# Patient Record
Sex: Male | Born: 1955
Health system: Southern US, Community
[De-identification: ages and names within clinical notes are randomized; demographics above are authoritative.]

## PROBLEM LIST (undated history)

## (undated) DIAGNOSIS — J45909 Unspecified asthma, uncomplicated: Secondary | ICD-10-CM

## (undated) DIAGNOSIS — N179 Acute kidney failure, unspecified: Secondary | ICD-10-CM

## (undated) DIAGNOSIS — G4730 Sleep apnea, unspecified: Secondary | ICD-10-CM

## (undated) DIAGNOSIS — G4733 Obstructive sleep apnea (adult) (pediatric): Secondary | ICD-10-CM

## (undated) DIAGNOSIS — R51 Headache: Secondary | ICD-10-CM

## (undated) DIAGNOSIS — I429 Cardiomyopathy, unspecified: Secondary | ICD-10-CM

## (undated) DIAGNOSIS — R05 Cough: Secondary | ICD-10-CM

## (undated) DIAGNOSIS — R0600 Dyspnea, unspecified: Secondary | ICD-10-CM

## (undated) DIAGNOSIS — R3916 Straining to void: Secondary | ICD-10-CM

## (undated) DIAGNOSIS — M542 Cervicalgia: Secondary | ICD-10-CM

## (undated) DIAGNOSIS — I509 Heart failure, unspecified: Secondary | ICD-10-CM

## (undated) DIAGNOSIS — F329 Major depressive disorder, single episode, unspecified: Secondary | ICD-10-CM

## (undated) DIAGNOSIS — Z978 Presence of other specified devices: Secondary | ICD-10-CM

## (undated) DIAGNOSIS — R21 Rash and other nonspecific skin eruption: Secondary | ICD-10-CM

## (undated) DIAGNOSIS — I4891 Unspecified atrial fibrillation: Secondary | ICD-10-CM

## (undated) DIAGNOSIS — F419 Anxiety disorder, unspecified: Secondary | ICD-10-CM

## (undated) DIAGNOSIS — Z960 Presence of urogenital implants: Secondary | ICD-10-CM

## (undated) DIAGNOSIS — F32A Depression, unspecified: Secondary | ICD-10-CM

## (undated) DIAGNOSIS — E785 Hyperlipidemia, unspecified: Secondary | ICD-10-CM

## (undated) DIAGNOSIS — K047 Periapical abscess without sinus: Secondary | ICD-10-CM

## (undated) DIAGNOSIS — C801 Malignant (primary) neoplasm, unspecified: Secondary | ICD-10-CM

## (undated) DIAGNOSIS — R200 Anesthesia of skin: Secondary | ICD-10-CM

## (undated) DIAGNOSIS — N400 Enlarged prostate without lower urinary tract symptoms: Secondary | ICD-10-CM

## (undated) DIAGNOSIS — D649 Anemia, unspecified: Secondary | ICD-10-CM

## (undated) DIAGNOSIS — N200 Calculus of kidney: Secondary | ICD-10-CM

## (undated) DIAGNOSIS — I119 Hypertensive heart disease without heart failure: Secondary | ICD-10-CM

## (undated) DIAGNOSIS — I10 Essential (primary) hypertension: Secondary | ICD-10-CM

## (undated) DIAGNOSIS — K0889 Other specified disorders of teeth and supporting structures: Secondary | ICD-10-CM

## (undated) HISTORY — DX: Cervicalgia: M54.2

## (undated) HISTORY — DX: Hypertensive heart disease without heart failure: I11.9

## (undated) HISTORY — DX: Sleep apnea, unspecified: G47.30

## (undated) HISTORY — DX: Acute kidney failure, unspecified: N17.9

## (undated) HISTORY — DX: Straining to void: R39.16

## (undated) HISTORY — PX: CYSTOSCOPY W/ RETROGRADES: SHX1426

## (undated) HISTORY — DX: Rash and other nonspecific skin eruption: R21

## (undated) HISTORY — PX: COLONOSCOPY: SHX174

## (undated) HISTORY — DX: Calculus of kidney: N20.0

## (undated) HISTORY — DX: Unspecified asthma, uncomplicated: J45.909

## (undated) HISTORY — DX: Other specified disorders of teeth and supporting structures: K08.89

## (undated) HISTORY — DX: Malignant (primary) neoplasm, unspecified: C80.1

## (undated) HISTORY — DX: Headache: R51

## (undated) HISTORY — DX: Essential (primary) hypertension: I10

## (undated) HISTORY — DX: Anxiety disorder, unspecified: F41.9

## (undated) HISTORY — DX: Periapical abscess without sinus: K04.7

## (undated) HISTORY — DX: Cough: R05

## (undated) HISTORY — DX: Hyperlipidemia, unspecified: E78.5

## (undated) HISTORY — DX: Benign prostatic hyperplasia without lower urinary tract symptoms: N40.0

## (undated) HISTORY — DX: Unspecified atrial fibrillation: I48.91

## (undated) HISTORY — PX: OTHER SURGICAL HISTORY: SHX169

## (undated) HISTORY — DX: Anemia, unspecified: D64.9

## (undated) HISTORY — DX: Obstructive sleep apnea (adult) (pediatric): G47.33

## (undated) HISTORY — DX: Anesthesia of skin: R20.0

---

## 2003-10-14 ENCOUNTER — Emergency Department (HOSPITAL_COMMUNITY): Admission: EM | Admit: 2003-10-14 | Discharge: 2003-10-14 | Payer: Self-pay | Admitting: Emergency Medicine

## 2004-08-07 ENCOUNTER — Emergency Department (HOSPITAL_COMMUNITY): Admission: EM | Admit: 2004-08-07 | Discharge: 2004-08-07 | Payer: Self-pay | Admitting: Family Medicine

## 2004-09-16 ENCOUNTER — Ambulatory Visit: Payer: Self-pay | Admitting: Internal Medicine

## 2004-09-30 ENCOUNTER — Ambulatory Visit: Payer: Self-pay | Admitting: Internal Medicine

## 2004-10-26 ENCOUNTER — Ambulatory Visit: Payer: Self-pay | Admitting: Internal Medicine

## 2004-11-04 ENCOUNTER — Ambulatory Visit: Payer: Self-pay | Admitting: Internal Medicine

## 2004-11-04 ENCOUNTER — Ambulatory Visit (HOSPITAL_COMMUNITY): Admission: RE | Admit: 2004-11-04 | Discharge: 2004-11-04 | Payer: Self-pay | Admitting: Internal Medicine

## 2005-05-12 ENCOUNTER — Ambulatory Visit: Payer: Self-pay | Admitting: Internal Medicine

## 2005-06-29 ENCOUNTER — Emergency Department (HOSPITAL_COMMUNITY): Admission: EM | Admit: 2005-06-29 | Discharge: 2005-06-29 | Payer: Self-pay | Admitting: Emergency Medicine

## 2005-07-14 ENCOUNTER — Ambulatory Visit: Payer: Self-pay | Admitting: Internal Medicine

## 2005-07-21 ENCOUNTER — Ambulatory Visit: Payer: Self-pay | Admitting: Internal Medicine

## 2005-12-03 ENCOUNTER — Emergency Department (HOSPITAL_COMMUNITY): Admission: EM | Admit: 2005-12-03 | Discharge: 2005-12-03 | Payer: Self-pay | Admitting: Emergency Medicine

## 2006-02-17 ENCOUNTER — Ambulatory Visit: Payer: Self-pay | Admitting: Internal Medicine

## 2006-03-01 DIAGNOSIS — M542 Cervicalgia: Secondary | ICD-10-CM

## 2006-03-01 DIAGNOSIS — I119 Hypertensive heart disease without heart failure: Secondary | ICD-10-CM

## 2006-03-01 DIAGNOSIS — L259 Unspecified contact dermatitis, unspecified cause: Secondary | ICD-10-CM | POA: Insufficient documentation

## 2006-03-01 DIAGNOSIS — I5042 Chronic combined systolic (congestive) and diastolic (congestive) heart failure: Secondary | ICD-10-CM

## 2006-03-01 HISTORY — DX: Hypertensive heart disease without heart failure: I11.9

## 2006-12-07 ENCOUNTER — Telehealth (INDEPENDENT_AMBULATORY_CARE_PROVIDER_SITE_OTHER): Payer: Self-pay | Admitting: Pharmacy Technician

## 2006-12-07 DIAGNOSIS — E1169 Type 2 diabetes mellitus with other specified complication: Secondary | ICD-10-CM | POA: Insufficient documentation

## 2006-12-07 DIAGNOSIS — E785 Hyperlipidemia, unspecified: Secondary | ICD-10-CM

## 2006-12-07 DIAGNOSIS — I10 Essential (primary) hypertension: Secondary | ICD-10-CM

## 2007-02-21 ENCOUNTER — Emergency Department (HOSPITAL_COMMUNITY): Admission: EM | Admit: 2007-02-21 | Discharge: 2007-02-21 | Payer: Self-pay | Admitting: Emergency Medicine

## 2007-06-02 ENCOUNTER — Ambulatory Visit: Payer: Self-pay | Admitting: Internal Medicine

## 2007-06-02 DIAGNOSIS — H538 Other visual disturbances: Secondary | ICD-10-CM

## 2007-06-05 ENCOUNTER — Ambulatory Visit: Payer: Self-pay | Admitting: Internal Medicine

## 2007-06-05 LAB — CONVERTED CEMR LAB
Alkaline Phosphatase: 71 units/L (ref 39–117)
Basophils Absolute: 0 10*3/uL (ref 0.0–0.1)
Eosinophils Absolute: 0.2 10*3/uL (ref 0.0–0.7)
Eosinophils Relative: 3 % (ref 0–5)
Glucose, Bld: 114 mg/dL — ABNORMAL HIGH (ref 70–99)
HCT: 47.7 % (ref 39.0–52.0)
LDL Cholesterol: 138 mg/dL — ABNORMAL HIGH (ref 0–99)
MCHC: 31.9 g/dL (ref 30.0–36.0)
MCV: 84.6 fL (ref 78.0–100.0)
Platelets: 177 10*3/uL (ref 150–400)
RDW: 13.4 % (ref 11.5–15.5)
Sodium: 141 meq/L (ref 135–145)
Total Bilirubin: 0.4 mg/dL (ref 0.3–1.2)
Total Protein: 7 g/dL (ref 6.0–8.3)
Triglycerides: 160 mg/dL — ABNORMAL HIGH (ref <150)
VLDL: 32 mg/dL (ref 0–40)

## 2007-06-23 ENCOUNTER — Encounter: Payer: Self-pay | Admitting: Internal Medicine

## 2007-07-03 ENCOUNTER — Encounter: Payer: Self-pay | Admitting: Internal Medicine

## 2008-01-31 ENCOUNTER — Ambulatory Visit: Payer: Self-pay | Admitting: Internal Medicine

## 2008-01-31 DIAGNOSIS — J309 Allergic rhinitis, unspecified: Secondary | ICD-10-CM | POA: Insufficient documentation

## 2008-01-31 DIAGNOSIS — R519 Headache, unspecified: Secondary | ICD-10-CM | POA: Insufficient documentation

## 2008-01-31 DIAGNOSIS — K3189 Other diseases of stomach and duodenum: Secondary | ICD-10-CM | POA: Insufficient documentation

## 2008-01-31 DIAGNOSIS — R51 Headache: Secondary | ICD-10-CM | POA: Insufficient documentation

## 2008-01-31 DIAGNOSIS — R1013 Epigastric pain: Secondary | ICD-10-CM

## 2008-01-31 LAB — CONVERTED CEMR LAB
AST: 26 units/L (ref 0–37)
BUN: 15 mg/dL (ref 6–23)
Basophils Relative: 0 % (ref 0–1)
CO2: 26 meq/L (ref 19–32)
Calcium: 9.4 mg/dL (ref 8.4–10.5)
Chlamydia, Swab/Urine, PCR: NEGATIVE
Chloride: 103 meq/L (ref 96–112)
Creatinine, Ser: 1.18 mg/dL (ref 0.40–1.50)
Eosinophils Relative: 3 % (ref 0–5)
HCT: 46.7 % (ref 39.0–52.0)
Hemoglobin, Urine: NEGATIVE
Hemoglobin: 15.4 g/dL (ref 13.0–17.0)
Leukocytes, UA: NEGATIVE
MCHC: 33 g/dL (ref 30.0–36.0)
MCV: 84.6 fL (ref 78.0–100.0)
Monocytes Absolute: 0.5 10*3/uL (ref 0.1–1.0)
Monocytes Relative: 9 % (ref 3–12)
Neutro Abs: 2.3 10*3/uL (ref 1.7–7.7)
Nitrite: NEGATIVE
Protein, ur: NEGATIVE mg/dL
RBC: 5.52 M/uL (ref 4.22–5.81)
RDW: 13.7 % (ref 11.5–15.5)
Total Bilirubin: 0.5 mg/dL (ref 0.3–1.2)

## 2008-03-06 ENCOUNTER — Encounter: Payer: Self-pay | Admitting: Internal Medicine

## 2008-03-06 ENCOUNTER — Ambulatory Visit (HOSPITAL_BASED_OUTPATIENT_CLINIC_OR_DEPARTMENT_OTHER): Admission: RE | Admit: 2008-03-06 | Discharge: 2008-03-06 | Payer: Self-pay | Admitting: Internal Medicine

## 2008-03-06 DIAGNOSIS — G4733 Obstructive sleep apnea (adult) (pediatric): Secondary | ICD-10-CM

## 2008-03-06 HISTORY — DX: Obstructive sleep apnea (adult) (pediatric): G47.33

## 2008-03-09 ENCOUNTER — Ambulatory Visit: Payer: Self-pay | Admitting: Internal Medicine

## 2008-03-14 ENCOUNTER — Telehealth: Payer: Self-pay | Admitting: *Deleted

## 2008-03-20 ENCOUNTER — Encounter: Payer: Self-pay | Admitting: Internal Medicine

## 2008-03-27 ENCOUNTER — Encounter: Payer: Self-pay | Admitting: Internal Medicine

## 2008-03-27 DIAGNOSIS — G4733 Obstructive sleep apnea (adult) (pediatric): Secondary | ICD-10-CM | POA: Insufficient documentation

## 2008-04-03 ENCOUNTER — Ambulatory Visit: Payer: Self-pay | Admitting: Internal Medicine

## 2008-04-03 LAB — CONVERTED CEMR LAB: Hgb A1c MFr Bld: 7.4 %

## 2008-04-08 ENCOUNTER — Ambulatory Visit: Payer: Self-pay | Admitting: Internal Medicine

## 2008-04-08 DIAGNOSIS — E1165 Type 2 diabetes mellitus with hyperglycemia: Secondary | ICD-10-CM | POA: Diagnosis present

## 2008-04-08 DIAGNOSIS — E1142 Type 2 diabetes mellitus with diabetic polyneuropathy: Secondary | ICD-10-CM | POA: Diagnosis present

## 2008-04-08 HISTORY — DX: Type 2 diabetes mellitus with hyperglycemia: E11.65

## 2008-04-09 ENCOUNTER — Ambulatory Visit: Payer: Self-pay | Admitting: Internal Medicine

## 2008-04-09 LAB — CONVERTED CEMR LAB

## 2008-04-15 ENCOUNTER — Encounter: Payer: Self-pay | Admitting: Licensed Clinical Social Worker

## 2008-04-19 ENCOUNTER — Ambulatory Visit: Payer: Self-pay | Admitting: Internal Medicine

## 2008-05-06 ENCOUNTER — Ambulatory Visit: Payer: Self-pay | Admitting: Infectious Diseases

## 2008-05-06 LAB — CONVERTED CEMR LAB: Blood Glucose, Fingerstick: 170

## 2008-07-11 ENCOUNTER — Telehealth: Payer: Self-pay | Admitting: *Deleted

## 2008-10-18 ENCOUNTER — Ambulatory Visit: Payer: Self-pay | Admitting: Internal Medicine

## 2008-10-18 ENCOUNTER — Encounter: Payer: Self-pay | Admitting: Internal Medicine

## 2008-10-18 LAB — CONVERTED CEMR LAB
Blood Glucose, Fingerstick: 145
Hgb A1c MFr Bld: 7.4 %

## 2008-10-25 ENCOUNTER — Ambulatory Visit: Payer: Self-pay | Admitting: Internal Medicine

## 2008-12-18 ENCOUNTER — Telehealth: Payer: Self-pay | Admitting: *Deleted

## 2009-02-27 ENCOUNTER — Ambulatory Visit: Payer: Self-pay | Admitting: Internal Medicine

## 2009-02-27 ENCOUNTER — Telehealth: Payer: Self-pay | Admitting: Internal Medicine

## 2009-02-27 LAB — CONVERTED CEMR LAB: Hgb A1c MFr Bld: >14 %

## 2009-02-28 LAB — CONVERTED CEMR LAB
Basophils Relative: 0 % (ref 0–1)
CO2: 22 meq/L (ref 19–32)
Cholesterol: 199 mg/dL (ref 0–200)
Eosinophils Absolute: 0.1 10*3/uL (ref 0.0–0.7)
Eosinophils Relative: 2 % (ref 0–5)
Glucose, Bld: 407 mg/dL — ABNORMAL HIGH (ref 70–99)
HCT: 44.2 % (ref 39.0–52.0)
Hemoglobin: 14.9 g/dL (ref 13.0–17.0)
MCHC: 33.7 g/dL (ref 30.0–36.0)
MCV: 82.9 fL (ref >=78.0)
Monocytes Absolute: 0.4 10*3/uL (ref 0.1–1.0)
Monocytes Relative: 7 % (ref 3–12)
Neutro Abs: 2.7 10*3/uL (ref 1.7–7.7)
RBC: 5.33 M/uL (ref 4.22–5.81)
Sodium: 134 meq/L — ABNORMAL LOW (ref 135–145)
Total Bilirubin: 0.4 mg/dL (ref 0.3–1.2)
Total Protein: 7.1 g/dL (ref 6.0–8.3)
Triglycerides: 379 mg/dL — ABNORMAL HIGH (ref <150)
VLDL: 76 mg/dL — ABNORMAL HIGH (ref 0–40)

## 2009-03-04 ENCOUNTER — Encounter: Payer: Self-pay | Admitting: Internal Medicine

## 2009-03-05 ENCOUNTER — Ambulatory Visit: Payer: Self-pay | Admitting: Internal Medicine

## 2009-03-12 ENCOUNTER — Encounter: Payer: Self-pay | Admitting: Internal Medicine

## 2009-03-19 ENCOUNTER — Telehealth: Payer: Self-pay | Admitting: *Deleted

## 2009-03-26 ENCOUNTER — Telehealth (INDEPENDENT_AMBULATORY_CARE_PROVIDER_SITE_OTHER): Payer: Self-pay | Admitting: *Deleted

## 2009-04-02 ENCOUNTER — Ambulatory Visit: Payer: Self-pay | Admitting: Infectious Disease

## 2009-04-02 ENCOUNTER — Encounter: Payer: Self-pay | Admitting: Internal Medicine

## 2009-04-02 LAB — CONVERTED CEMR LAB: Blood Glucose, Fingerstick: 158

## 2009-04-16 ENCOUNTER — Telehealth (INDEPENDENT_AMBULATORY_CARE_PROVIDER_SITE_OTHER): Payer: Self-pay | Admitting: *Deleted

## 2009-05-05 ENCOUNTER — Telehealth (INDEPENDENT_AMBULATORY_CARE_PROVIDER_SITE_OTHER): Payer: Self-pay | Admitting: *Deleted

## 2009-05-19 ENCOUNTER — Telehealth (INDEPENDENT_AMBULATORY_CARE_PROVIDER_SITE_OTHER): Payer: Self-pay | Admitting: *Deleted

## 2009-07-24 ENCOUNTER — Encounter (INDEPENDENT_AMBULATORY_CARE_PROVIDER_SITE_OTHER): Payer: Self-pay | Admitting: Internal Medicine

## 2009-07-24 ENCOUNTER — Ambulatory Visit: Payer: Self-pay | Admitting: Infectious Diseases

## 2009-07-31 ENCOUNTER — Ambulatory Visit: Payer: Self-pay | Admitting: Internal Medicine

## 2009-07-31 LAB — CONVERTED CEMR LAB
ALT: 29 units/L (ref 0–53)
AST: 19 units/L (ref 0–37)
Albumin: 4 g/dL (ref 3.5–5.2)
Calcium: 8.6 mg/dL (ref 8.4–10.5)
Chloride: 103 meq/L (ref 96–112)
Hgb A1c MFr Bld: 7.5 %
Potassium: 4 meq/L (ref 3.5–5.3)
Total Protein: 7 g/dL (ref 6.0–8.3)

## 2010-01-02 ENCOUNTER — Ambulatory Visit: Payer: Self-pay | Admitting: Internal Medicine

## 2010-01-02 LAB — CONVERTED CEMR LAB
Blood Glucose, AC Bkfst: 490 mg/dL
Hgb A1c MFr Bld: >14 %

## 2010-01-08 ENCOUNTER — Encounter: Payer: Self-pay | Admitting: Internal Medicine

## 2010-01-15 ENCOUNTER — Ambulatory Visit: Payer: Self-pay | Admitting: Internal Medicine

## 2010-01-15 ENCOUNTER — Encounter: Payer: Self-pay | Admitting: Internal Medicine

## 2010-01-15 LAB — CONVERTED CEMR LAB
AST: 24 units/L (ref 0–37)
Alkaline Phosphatase: 60 units/L (ref 39–117)
BUN: 7 mg/dL (ref 6–23)
Calcium: 8.4 mg/dL (ref 8.4–10.5)
Creatinine, Ser: 1.11 mg/dL (ref 0.40–1.50)
Creatinine, Urine: 250.2 mg/dL
HDL: 32 mg/dL — ABNORMAL LOW (ref >39)
LDL Cholesterol: 108 mg/dL — ABNORMAL HIGH (ref 0–99)
Microalb, Ur: 3.71 mg/dL — ABNORMAL HIGH (ref 0.00–1.89)
Total CHOL/HDL Ratio: 5.2
Triglycerides: 132 mg/dL (ref <150)

## 2010-02-04 ENCOUNTER — Telehealth: Payer: Self-pay | Admitting: Internal Medicine

## 2010-03-05 ENCOUNTER — Telehealth: Payer: Self-pay | Admitting: *Deleted

## 2010-03-10 ENCOUNTER — Telehealth: Payer: Self-pay | Admitting: *Deleted

## 2010-03-18 ENCOUNTER — Encounter: Payer: Self-pay | Admitting: Internal Medicine

## 2010-04-01 ENCOUNTER — Encounter: Payer: Self-pay | Admitting: Internal Medicine

## 2010-04-13 ENCOUNTER — Emergency Department (HOSPITAL_COMMUNITY)
Admission: EM | Admit: 2010-04-13 | Discharge: 2010-04-13 | Payer: Self-pay | Source: Home / Self Care | Admitting: Family Medicine

## 2010-04-15 ENCOUNTER — Ambulatory Visit: Payer: Self-pay | Admitting: Internal Medicine

## 2010-04-15 LAB — CONVERTED CEMR LAB
Albumin: 4.2 g/dL (ref 3.5–5.2)
Alkaline Phosphatase: 58 units/L (ref 39–117)
BUN: 14 mg/dL (ref 6–23)
CO2: 29 meq/L (ref 19–32)
Glucose, Bld: 125 mg/dL — ABNORMAL HIGH (ref 70–99)
Hgb A1c MFr Bld: 6.7 %
PSA: 3.13 ng/mL (ref <=4.00)
Potassium: 4.6 meq/L (ref 3.5–5.3)

## 2010-04-15 LAB — HM DIABETES EYE EXAM

## 2010-04-15 LAB — HM DIABETES FOOT EXAM

## 2010-04-16 ENCOUNTER — Encounter (INDEPENDENT_AMBULATORY_CARE_PROVIDER_SITE_OTHER): Payer: Self-pay | Admitting: *Deleted

## 2010-04-17 ENCOUNTER — Ambulatory Visit: Payer: Self-pay | Admitting: Internal Medicine

## 2010-04-17 ENCOUNTER — Encounter (INDEPENDENT_AMBULATORY_CARE_PROVIDER_SITE_OTHER): Payer: Self-pay | Admitting: *Deleted

## 2010-04-17 ENCOUNTER — Telehealth: Payer: Self-pay | Admitting: *Deleted

## 2010-04-29 ENCOUNTER — Telehealth: Payer: Self-pay | Admitting: Internal Medicine

## 2010-05-01 ENCOUNTER — Ambulatory Visit: Payer: Self-pay | Admitting: Internal Medicine

## 2010-05-01 LAB — HM COLONOSCOPY

## 2010-05-29 ENCOUNTER — Emergency Department (HOSPITAL_COMMUNITY)
Admission: EM | Admit: 2010-05-29 | Discharge: 2010-05-29 | Payer: Self-pay | Source: Home / Self Care | Admitting: Emergency Medicine

## 2010-05-29 DIAGNOSIS — N200 Calculus of kidney: Secondary | ICD-10-CM | POA: Insufficient documentation

## 2010-05-29 HISTORY — DX: Calculus of kidney: N20.0

## 2010-06-01 LAB — CBC
HCT: 44.5 % (ref 39.0–52.0)
Hemoglobin: 15 g/dL (ref 13.0–17.0)
MCH: 27.5 pg (ref 26.0–34.0)
MCHC: 33.7 g/dL (ref 30.0–36.0)
MCV: 81.7 fL (ref 78.0–100.0)
Platelets: 163 10*3/uL (ref 150–400)
RBC: 5.45 MIL/uL (ref 4.22–5.81)
RDW: 13.7 % (ref 11.5–15.5)
WBC: 8.8 10*3/uL (ref 4.0–10.5)

## 2010-06-01 LAB — DIFFERENTIAL
Basophils Absolute: 0 10*3/uL (ref 0.0–0.1)
Basophils Relative: 0 % (ref 0–1)
Eosinophils Absolute: 0 10*3/uL (ref 0.0–0.7)
Eosinophils Relative: 0 % (ref 0–5)
Lymphocytes Relative: 13 % (ref 12–46)
Lymphs Abs: 1.2 10*3/uL (ref 0.7–4.0)
Monocytes Absolute: 0.5 10*3/uL (ref 0.1–1.0)
Monocytes Relative: 6 % (ref 3–12)
Neutro Abs: 7.1 10*3/uL (ref 1.7–7.7)
Neutrophils Relative %: 81 % — ABNORMAL HIGH (ref 43–77)

## 2010-06-02 ENCOUNTER — Telehealth: Payer: Self-pay | Admitting: *Deleted

## 2010-06-02 ENCOUNTER — Ambulatory Visit
Admission: RE | Admit: 2010-06-02 | Discharge: 2010-06-02 | Payer: Self-pay | Source: Home / Self Care | Attending: Internal Medicine | Admitting: Internal Medicine

## 2010-06-05 ENCOUNTER — Inpatient Hospital Stay (HOSPITAL_COMMUNITY)
Admission: EM | Admit: 2010-06-05 | Discharge: 2010-06-09 | Payer: Self-pay | Source: Home / Self Care | Attending: Internal Medicine | Admitting: Internal Medicine

## 2010-06-07 ENCOUNTER — Ambulatory Visit (HOSPITAL_COMMUNITY)
Admission: AD | Admit: 2010-06-07 | Discharge: 2010-06-07 | Payer: Self-pay | Source: Home / Self Care | Attending: Urology | Admitting: Urology

## 2010-06-08 ENCOUNTER — Encounter: Payer: Self-pay | Admitting: Internal Medicine

## 2010-06-08 LAB — CBC
HCT: 41.4 % (ref 39.0–52.0)
Hemoglobin: 13.2 g/dL (ref 13.0–17.0)
MCH: 25.8 pg — ABNORMAL LOW (ref 26.0–34.0)
MCHC: 31.9 g/dL (ref 30.0–36.0)
MCV: 80.9 fL (ref 78.0–100.0)
Platelets: 190 10*3/uL (ref 150–400)
RBC: 5.12 MIL/uL (ref 4.22–5.81)
RDW: 13 % (ref 11.5–15.5)
WBC: 8.4 10*3/uL (ref 4.0–10.5)

## 2010-06-08 LAB — BASIC METABOLIC PANEL
BUN: 12 mg/dL (ref 6–23)
CO2: 25 mEq/L (ref 19–32)
Calcium: 9.1 mg/dL (ref 8.4–10.5)
Chloride: 103 mEq/L (ref 96–112)
Creatinine, Ser: 1.68 mg/dL — ABNORMAL HIGH (ref 0.4–1.5)
GFR calc Af Amer: 52 mL/min — ABNORMAL LOW (ref >60)
GFR calc non Af Amer: 43 mL/min — ABNORMAL LOW (ref >60)
Glucose, Bld: 128 mg/dL — ABNORMAL HIGH (ref 70–99)
Potassium: 4.6 mEq/L (ref 3.5–5.1)
Sodium: 136 mEq/L (ref 135–145)

## 2010-06-08 LAB — DIFFERENTIAL
Basophils Absolute: 0 10*3/uL (ref 0.0–0.1)
Basophils Relative: 0 % (ref 0–1)
Eosinophils Absolute: 0 10*3/uL (ref 0.0–0.7)
Eosinophils Relative: 1 % (ref 0–5)
Lymphocytes Relative: 21 % (ref 12–46)
Lymphs Abs: 1.7 10*3/uL (ref 0.7–4.0)
Monocytes Absolute: 0.9 10*3/uL (ref 0.1–1.0)
Monocytes Relative: 11 % (ref 3–12)
Neutro Abs: 5.7 10*3/uL (ref 1.7–7.7)
Neutrophils Relative %: 68 % (ref 43–77)

## 2010-06-08 LAB — RAPID URINE DRUG SCREEN, HOSP PERFORMED
Amphetamines: NOT DETECTED
Barbiturates: NOT DETECTED
Benzodiazepines: NOT DETECTED
Cocaine: NOT DETECTED
Opiates: POSITIVE — AB
Tetrahydrocannabinol: NOT DETECTED

## 2010-06-08 LAB — HIV ANTIBODY (ROUTINE TESTING W REFLEX): HIV: NONREACTIVE

## 2010-06-08 LAB — LIPID PANEL
Cholesterol: 130 mg/dL (ref 0–200)
HDL: 29 mg/dL — ABNORMAL LOW (ref >39)
LDL Cholesterol: 86 mg/dL (ref 0–99)
Total CHOL/HDL Ratio: 4.5 RATIO
Triglycerides: 75 mg/dL (ref <150)
VLDL: 15 mg/dL (ref 0–40)

## 2010-06-08 LAB — URINALYSIS, ROUTINE W REFLEX MICROSCOPIC
Bilirubin Urine: NEGATIVE
Hgb urine dipstick: NEGATIVE
Ketones, ur: NEGATIVE mg/dL
Nitrite: NEGATIVE
Protein, ur: NEGATIVE mg/dL
Specific Gravity, Urine: 1.023 (ref 1.005–1.030)
Urine Glucose, Fasting: NEGATIVE mg/dL
Urobilinogen, UA: 1 mg/dL (ref 0.0–1.0)
pH: 7.5 (ref 5.0–8.0)

## 2010-06-08 LAB — HEMOGLOBIN A1C
Hgb A1c MFr Bld: 7 % — ABNORMAL HIGH (ref <5.7)
Mean Plasma Glucose: 154 mg/dL — ABNORMAL HIGH (ref <117)

## 2010-06-08 LAB — GLUCOSE, CAPILLARY
Glucose-Capillary: 121 mg/dL — ABNORMAL HIGH (ref 70–99)
Glucose-Capillary: 149 mg/dL — ABNORMAL HIGH (ref 70–99)

## 2010-06-08 NOTE — Op Note (Addendum)
[name], Parks NO.:  [id number]  MEDICAL RECORD NO.:  [id number]          PATIENT TYPE:  OUT  LOCATION:  DAY                          FACILITY:  [facility]  PHYSICIAN:  Valetta Fuller, M.D.  DATE OF BIRTH:  31-May-1955  DATE OF PROCEDURE: DATE OF DISCHARGE:                              OPERATIVE REPORT   PREOPERATIVE DIAGNOSES: 1. Left distal ureteral stone. 2. Massive BPH.  POSTOPERATIVE DIAGNOSES: 1. Left distal ureteral stone. 2. Massive BPH.  PROCEDURE:  Cystoscopy.  Attempts at retrograde pyelogram and ureteroscopy unsuccessful.  SURGEON:  Valetta Fuller, M.D.  ANESTHESIA:  General.  INDICATIONS:  Terry Parks is 55 years of age.  The patient had originally presented on May 29, 2010, with sudden onset of severe left flank pain.  This was his first clinical stone event.  He was diagnosed with a small 1-2 mm stone in his distal left ureter and instructed on outpatient followup.  The patient subsequently came back to [facility], continuing to complain of severe pain.  He was admitted by the medical service.  The patient's renal function had declined somewhat with some mild renal insufficiency with a creatinine of approximately 1.7.  An ultrasound was done, which confirmed continued presence of left hydronephrosis and the patient continued to have pain. We were consulted.  We reviewed his CT and found that he did indeed have an obstructing 1-2 mm distal left ureteral stone with massive BPH and a markedly enlarged middle lobe of his prostate extending out well beyond the bladder neck and covering the trigone of his bladder.  The patient was initially seen by my nurse practitioner and then by myself.  Mr. Axel was told that a stone this size ought to have 90% chance of spontaneous passage and would be our preference to continue with attempts at spontaneous passage.  We also told him that given his prostate anatomy, there would be a high  likelihood of some hematuria due to potential trauma to the middle lobe of the prostate and potentially great difficulty if not impossibility of accessing the ureteral orifice. Despite that the patient insisted on having a definitive procedure and did not want to go home without having the stone extracted.  We did encourage him to give it at least an additional 24 hours to see if the stone would pass.  The patient has continued to complain of discomfort. He was transferred to the [facility] Emergency Room.  Again, he requested attempted definitive intervention with the parameters as previously established.  TECHNIQUE/FINDINGS:  The patient was brought to the operating room where he had successful induction of general anesthesia with LMA airway.  He received perioperative ciprofloxacin.  He was placed in lithotomy position and prepped and draped in the usual manner.  Appropriate surgical time-out was performed.  Cystoscopy was initiated.  The patient had massive trilobar hyperplasia with an extremely large middle lobe with numerous friable vessels.  It was difficult to get the rigid cystoscope up and over the middle lobe and with that some initial hematuria began.  After approximately 30-40 minutes of time with a variety of lens systems, we were able  to identify the right ureteral orifice tucked underneath way behind the middle lobe of the prostate. Despite multiple attempts and a good 45 minutes to 60 minutes were unable to identify the left ureteral orifice in order to consider retrograde pyelogram, stent placement, or ureteroscopy.  Because of scope trauma to the middle lobe of the prostate, the patient continued to have gross hematuria and we felt it prudent to at least temporarily place a catheter to monitor the degree of hematuria.  A 22-French 3-way Foley catheter was placed.  Urine remained medium cherry red.  A continuous bladder irrigation with saline was instituted.  The  patient was brought to recovery room in stable condition with additional plans, pending reassessment of the stone, and determination whether percutaneous nephrostomy tube with subsequent double-J stent placement is going to be necessary.  This will depend on whether the stone is indeed still present and also how the patient's pain does over the ensuing observation.     Valetta Fuller, M.D.     DSG/MEDQ  D:  06/07/2010  T:  06/08/2010  Job:  [id number]  Electronically Signed by Barron Alvine M.D. on 06/08/2010 09:23:41 AM

## 2010-06-08 NOTE — Consult Note (Signed)
Terry Parks, Terry Parks NO.:  [id number]  MEDICAL RECORD NO.:  [id number]          PATIENT TYPE:  INP  LOCATION:  6739                         FACILITY:  [facility]  PHYSICIAN:  Valetta Fuller, M.D.  DATE OF BIRTH:  [birth year]  DATE OF CONSULTATION:  06/06/2010 DATE OF DISCHARGE:                                CONSULTATION   REASON FOR CONSULTATION:  Left 1-2 mm obstructing left UVJ calculus with left hydroureteronephrosis.  HISTORY OF PRESENT ILLNESS:  This is a 55 year old gentleman who presented to [facility] on May 29, 2010, for an approximate 5-day history of complaints of left flank pain with radiation to his left abdomen and left groin.  His pain began on that day and was constant.  It was associated with nausea and vomiting as well as urinary frequency.  He denies any fever, chills, diarrhea or hematuria.  CT of abdomen and pelvis was performed which showed a 1-2 mm obstructing left UVJ calculus with hydronephrosis and enlarged prostate. He was sent home with pain medication.  He states that pain was uncontrollable, therefore, he was readmitted on June 05, 2010, for pain control.  He states that the pain waxes and wanes at this time as well as yesterday and is not further associated with nausea, vomiting, fever, chills or urinary frequency.  He continues to have diarrhea as well.  His serum creatinine was obtained on June 05, 2010, and stated some to be 1.6.  Today, it is increased to 1.7.  His urinalysis is negative and his white blood cell count is 8.8.  Renal ultrasound was performed on June 06, 2010, which showed left hydronephrosis and enlarged prostate.  Today, he has no new complaints.  He denies complaints of chest pain or shortness of breath as well.  PAST MEDICAL HISTORY: 1. Diabetes. 2. Hypertension. 3. Hyperlipidemia. 4. BPH.  ALLERGIES:  No known drug allergies.  MEDICATIONS: 1. Lantus insulin 2.  Atenolol 3. Accupril. 4. Metformin. 5. Omeprazole. 6. Pravastatin. 7. Ibuprofen. 8. Dilaudid p.r.n.  SOCIAL HISTORY:  He is single.  He works in [employer], hospital for the [employer].  He lives alone.  FAMILY HISTORY:  He has a mother with cancer, diabetes and dementia.  He also has siblings with kidney stones.  REVIEW OF SYSTEMS:  As stated for per HPI.  PHYSICAL EXAMINATION:  VITAL SIGNS:  Temp 98.0, pulse 79, respirations 20, blood pressure 153/97. CONSTITUTIONAL:  He is a well-developed obese black male, in no acute distress with positive overt discomfort grabbing on his left side. HEENT:  Normocephalic, atraumatic.  Oropharynx clear. ABDOMEN:  Obese, soft, nontender, nondistended with no CVA tenderness. GU:  Penis is without lesion or mass, bilaterally descended testes without mass. EXTREMITIES:  Positive 1+ lower extremity edema.  No atrophy. NEURO:  Remote and recent memory is intact. SKIN:  Warm, dry and intact.  LABORATORY DATA:  Sodium 140, potassium 4.6, chloride 104, CO2 26, BUN 15, creatinine 1.75, glucose 111.  WBC is 6.5, hemoglobin 4.9, hematocrit 39.1, platelets 178.  Urinalysis specific gravity 1.023, pH 7.  The rest is dipstick negative.  RADIOLOGY: 1. CT of abdomen and  pelvis shows 1-2 mm left obstructing UVJ calculus     with left hydroureteronephrosis. 2. A 1-2 mm left lower pole nonobstructing renal calculus. 3. Marked enlarged prostate with a prominent median lobe.  RENAL ULTRASOUND: 1. Left hydronephrosis. 2. Enlarged prostate.  IMPRESSION AND PLAN: 1. A 1-2 mm left obstructing ureterovesical junction calculus with     hydronephrosis.  Recommend allowing the patient to attempt passing     his stone on his own if pain is manageable.  Stone is very small     and located at [employer].  A 90-95% will pass this stone spontaneously if     pain is manageable.  Do expect some hydronephrosis and an     increasing creatinine with obstruction secondary to  calculus as     well as possible to some dehydration.  Should be no damage to     kidney function with his short-term obstruction.  If pain is not     managed, he will require transfer to [facility] on June 07, 2010, for surgical removal and then transferred back to [facility] after     his procedure.  An enlarged prostate does may procedure more     difficult to perform and increase his risk of complications during     the procedure.  Dr. Isabel Caprice will see him later today and talk about     this procedure.  Continue to strain his urine today and tomorrow.     If he denies any further pain later this p.m. or tomorrow morning,     will require further imaging to make sure he did not pass the     stone. 2. Enlarged prostate.  Continue with his Flomax.  May take up to 7     days to see full benefit.     Delia Chimes, NP   ______________________________ Valetta Fuller, M.D.   MA/MEDQ  D:  06/06/2010  T:  06/07/2010  Job:  [id number]  Electronically Signed by Delia Chimes NP on 06/08/2010 10:24:36 AM Electronically Signed by Barron Alvine M.D. on 06/08/2010 11:48:07 AM

## 2010-06-09 DIAGNOSIS — N401 Enlarged prostate with lower urinary tract symptoms: Secondary | ICD-10-CM | POA: Insufficient documentation

## 2010-06-09 LAB — LIPID PANEL
Cholesterol: 121 mg/dL (ref 0–200)
HDL: 24 mg/dL — ABNORMAL LOW (ref >39)
LDL Cholesterol: 80 mg/dL (ref 0–99)
Total CHOL/HDL Ratio: 5 RATIO
Triglycerides: 83 mg/dL (ref <150)
VLDL: 17 mg/dL (ref 0–40)

## 2010-06-09 LAB — BASIC METABOLIC PANEL
BUN: 15 mg/dL (ref 6–23)
BUN: 11 mg/dL (ref 6–23)
CO2: 26 mEq/L (ref 19–32)
CO2: 27 mEq/L (ref 19–32)
Calcium: 8.7 mg/dL (ref 8.4–10.5)
Calcium: 8.3 mg/dL — ABNORMAL LOW (ref 8.4–10.5)
Chloride: 104 mEq/L (ref 96–112)
Chloride: 103 mEq/L (ref 96–112)
Creatinine, Ser: 1.75 mg/dL — ABNORMAL HIGH (ref 0.4–1.5)
Creatinine, Ser: 1.49 mg/dL (ref 0.4–1.5)
Creatinine, Ser: 1.38 mg/dL (ref 0.4–1.5)
GFR calc Af Amer: 50 mL/min — ABNORMAL LOW (ref >60)
GFR calc Af Amer: 60 mL/min — ABNORMAL LOW (ref >60)
GFR calc Af Amer: >60 mL/min (ref >60)
GFR calc non Af Amer: 41 mL/min — ABNORMAL LOW (ref >60)
GFR calc non Af Amer: 50 mL/min — ABNORMAL LOW (ref >60)
Glucose, Bld: 111 mg/dL — ABNORMAL HIGH (ref 70–99)
Glucose, Bld: 171 mg/dL — ABNORMAL HIGH (ref 70–99)
Potassium: 4.6 mEq/L (ref 3.5–5.1)
Sodium: 140 mEq/L (ref 135–145)

## 2010-06-09 LAB — GLUCOSE, CAPILLARY
Glucose-Capillary: 115 mg/dL — ABNORMAL HIGH (ref 70–99)
Glucose-Capillary: 145 mg/dL — ABNORMAL HIGH (ref 70–99)
Glucose-Capillary: 128 mg/dL — ABNORMAL HIGH (ref 70–99)
Glucose-Capillary: 191 mg/dL — ABNORMAL HIGH (ref 70–99)
Glucose-Capillary: 102 mg/dL — ABNORMAL HIGH (ref 70–99)
Glucose-Capillary: 108 mg/dL — ABNORMAL HIGH (ref 70–99)
Glucose-Capillary: 85 mg/dL (ref 70–99)
Glucose-Capillary: 162 mg/dL — ABNORMAL HIGH (ref 70–99)

## 2010-06-09 LAB — CBC
HCT: 39.1 % (ref 39.0–52.0)
Hemoglobin: 12.9 g/dL — ABNORMAL LOW (ref 13.0–17.0)
MCH: 26.8 pg (ref 26.0–34.0)
MCHC: 33 g/dL (ref 30.0–36.0)
MCV: 81.3 fL (ref 78.0–100.0)
Platelets: 178 10*3/uL (ref 150–400)
RBC: 4.81 MIL/uL (ref 4.22–5.81)
RDW: 13.2 % (ref 11.5–15.5)
WBC: 6.5 10*3/uL (ref 4.0–10.5)

## 2010-06-09 LAB — SODIUM, URINE, RANDOM: Sodium, Ur: 165 mEq/L

## 2010-06-09 LAB — URIC ACID, RANDOM URINE: Uric Acid, Urine: 57.4 mg/dL

## 2010-06-09 LAB — CREATININE, URINE, RANDOM: Creatinine, Urine: 187.2 mg/dL

## 2010-06-10 ENCOUNTER — Ambulatory Visit: Admit: 2010-06-10 | Payer: Self-pay | Admitting: Internal Medicine

## 2010-06-10 LAB — URINALYSIS, ROUTINE W REFLEX MICROSCOPIC
Protein, ur: 100 mg/dL — AB
Specific Gravity, Urine: 1.009 (ref 1.005–1.030)
Urine Glucose, Fasting: NEGATIVE mg/dL
Urobilinogen, UA: 0.2 mg/dL (ref 0.0–1.0)

## 2010-06-10 LAB — BASIC METABOLIC PANEL
CO2: 26 mEq/L (ref 19–32)
Calcium: 8.7 mg/dL (ref 8.4–10.5)
Creatinine, Ser: 1.17 mg/dL (ref 0.4–1.5)
Glucose, Bld: 141 mg/dL — ABNORMAL HIGH (ref 70–99)

## 2010-06-10 LAB — URINE MICROSCOPIC-ADD ON

## 2010-06-16 NOTE — Assessment & Plan Note (Signed)
Summary: EST-ROUTINE CHECKUP/CH   Vital Signs:  Patient profile:   55 year old male Height:      71 inches Weight:      317.4 pounds BMI:     44.43 Temp:     98.9 degrees F oral Pulse rate:   81 / minute BP sitting:   162 / 107  (right arm)  Vitals Entered By: Filomena Jungling NT II (April 15, 2010 9:32 AM) CC: HEADACHES/ WENT TO URGENT CARE Is Patient Diabetic? Yes Did you bring your meter with you today? Yes Pain Assessment Patient in pain? yes     Location: HEADACHES Intensity: 6 Type: aching Onset of pain  Intermittent Nutritional Status BMI of > 30 = obese  Have you ever been in a relationship where you felt threatened, hurt or afraid?No   Does patient need assistance? Functional Status Self care Ambulation Normal    Primary Care Provider:  Margarito Liner MD  CC:  HEADACHES/ WENT TO URGENT CARE.  History of Present Illness: Patient returns for followup of his diabetes mellitus, hypertension, hyperlipidemia, and other chronic medical problems. He was recently seen on November 28 at urgent care for a complaint of headache, and was treated with a short course of Tylenol No. 3. He reports that his headache is much improved. He denies any associated symptoms with his headache.  He reports that he is compliant with his medications.    Preventive Screening-Counseling & Management  Alcohol-Tobacco     Smoking Status: never  Caffeine-Diet-Exercise     Caffeine use/day: 0     Does Patient Exercise: yes     Type of exercise: walking  Current Medications (verified): 1)  Atenolol 50 Mg Tabs (Atenolol) .... Take 1 Tablet By Mouth Once A Day 2)  Lisinopril 20 Mg Tabs (Lisinopril) .... Take 1 Tablet By Mouth Once A Day 3)  Amlodipine Besylate 10 Mg Tabs (Amlodipine Besylate) .... Take 1 Tablet By Mouth Once A Day 4)  Metformin Hcl 1000 Mg Tabs (Metformin Hcl) .... Take 1 Tablet By Mouth Two Times A Day 5)  Lantus Solostar 100 Unit/ml Soln (Insulin Glargine) .... Take 12  Units of Insulin Subcutaneously At Bed Time 6)  Truetrack Test  Strp (Glucose Blood) .... Use To Test Blood Sugar Three Times A Day Before Meals 7)  Lancets  Misc (Lancets) .... Use To Test Blood Sugar Three Times A Day Before Meals 8)  Omeprazole 20 Mg Cpdr (Omeprazole) .... Take 1 Capsule By Mouth Once A Day 9)  Pravastatin Sodium 40 Mg Tabs (Pravastatin Sodium) .... Take 1 Tablet By Mouth Once A Day  Allergies (verified): No Known Drug Allergies  Past History:  Past Medical History: Hyperlipidemia Hypertension Cardiomegaly Pleuritis Hyperglycemia Dermatitis Neck pain Diabetes Mellitus Type 2 Allergic rhinitis History of groin abscess History of abscessed tooth Obstructive sleep apnea Headache Dyspepsia  Review of Systems General:  Denies fever. ENT:  Denies ear discharge, earache, and sore throat. CV:  Denies chest pain or discomfort and swelling of feet. Resp:  Denies cough and shortness of breath. GI:  Denies abdominal pain, bloody stools, dark tarry stools, nausea, and vomiting. GU:  Denies dysuria. MS:  Denies muscle aches and cramps.  Physical Exam  General:  alert, no distress Lungs:  normal respiratory effort, normal breath sounds, no crackles, and no wheezes.   Heart:  normal rate, regular rhythm, no murmur, no gallop, and no rub.   Abdomen:  soft, non-tender, and normal bowel sounds.   Extremities:  no edema  Diabetes Management Exam:    Foot Exam (with socks and/or shoes not present):       Sensory-Monofilament:          Left foot: normal          Right foot: normal   Impression & Recommendations:  Problem # 1:  DM (ICD-250.00) Patient's diabetes is well controlled on current regimen. Will continue medications as below, and check labs as below.  He reports having an eye exam by Dr. Hyacinth Meeker last week; will request a copy of that report.  His updated medication list for this problem includes:    Lisinopril 20 Mg Tabs (Lisinopril) .[name]... Take 1 and 1/2   tablets by mouth once a day    Metformin Hcl 1000 Mg Tabs (Metformin hcl) .[name]... Take 1 tablet by mouth two times a day    Lantus Solostar 100 Unit/ml Soln (Insulin glargine) .[name]... Take 12 units of insulin subcutaneously at bed time  Labs Reviewed: Creat: 1.11 (01/15/2010)     Last Eye Exam: No diabetic retinopathy.   Exam by Molly Maduro L. Dione Booze, MD  (03/12/2009) Reviewed HgBA1c results: 6.7 (04/15/2010)  >14.0 (01/02/2010)  Orders: T-CMP with Estimated GFR ([address]) T- Capillary Blood Glucose (82948) T-Hgb A1C (in-house) (40981XB)  Problem # 2:  HYPERTENSION (ICD-401.9) Patient's blood pressure is moderately elevated. The plan is to increase lisinopril to a dose of 30 mg daily.  His updated medication list for this problem includes:    Atenolol 50 Mg Tabs (Atenolol) .[name]... Take 1 tablet by mouth once a day    Lisinopril 20 Mg Tabs (Lisinopril) .[name]... Take 1 and 1/2  tablets by mouth once a day    Amlodipine Besylate 10 Mg Tabs (Amlodipine besylate) .[name]... Take 1 tablet by mouth once a day  BP today: 162/107 Prior BP: 150/94 (01/15/2010)  Labs Reviewed: K+: 4.2 (01/15/2010) Creat: : 1.11 (01/15/2010)   Chol: 166 (01/15/2010)   HDL: 32 (01/15/2010)   LDL: 108 (01/15/2010)   TG: 132 (01/15/2010)  Problem # 3:  HYPERLIPIDEMIA (ICD-272.4) Patient's recent LDL is above goal; the plan is to increase pravastatin to a dose of 80 mg daily.  His updated medication list for this problem includes:    Pravastatin Sodium 40 Mg Tabs (Pravastatin sodium) .[name]... Take 2  tablets by mouth once a day  Labs Reviewed: SGOT: 24 (01/15/2010)   SGPT: 37 (01/15/2010)   HDL:32 (01/15/2010), 28 (07/31/2009)  LDL:108 (01/15/2010), 128 (07/31/2009)  Chol:166 (01/15/2010), 201 (07/31/2009)  Trig:132 (01/15/2010), 225 (07/31/2009)  Problem # 4:  HEADACHE (ICD-784.0) Patient's headache is much better and has essentially resolved. He has had no associated signs or symptoms of a serious underlying problem.  I  advised him to let me know if it recurs or if he has any new symptoms.  His updated medication list for this problem includes:    Atenolol 50 Mg Tabs (Atenolol) .[name]... Take 1 tablet by mouth once a day  Problem # 5:  Preventive Health Care (ICD-V70.0) A Pneumovax was given today. I discussed the pros and cons of PSA testing with patient, and he requested a PSA which was ordered.  Complete Medication List: 1)  Atenolol 50 Mg Tabs (Atenolol) .... Take 1 tablet by mouth once a day 2)  Lisinopril 20 Mg Tabs (Lisinopril) .... Take 1 and 1/2  tablets by mouth once a day 3)  Amlodipine Besylate 10 Mg Tabs (Amlodipine besylate) .... Take 1 tablet by mouth once a day 4)  Metformin  Hcl 1000 Mg Tabs (Metformin hcl) .... Take 1 tablet by mouth two times a day 5)  Lantus Solostar 100 Unit/ml Soln (Insulin glargine) .... Take 12 units of insulin subcutaneously at bed time 6)  Truetrack Test Strp (Glucose blood) .... Use to test blood sugar three times a day before meals 7)  Lancets Misc (Lancets) .... Use to test blood sugar three times a day before meals 8)  Omeprazole 20 Mg Cpdr (Omeprazole) .... Take 1 capsule by mouth once a day 9)  Pravastatin Sodium 40 Mg Tabs (Pravastatin sodium) .... Take 2  tablets by mouth once a day  Other Orders: T-PSA Total ([address]) Pneumococcal Vaccine (82956) Admin 1st Vaccine (21308)  Patient Instructions: 1)  Please schedule a follow-up appointment in 2 months. 2)  Increase lisinopril 20 mg to a dose of 1 and 1/2 tablets daily. 3)  Increase pravastatin 40 mg to a dose of 2 tablets daily.  Prescriptions: PRAVASTATIN SODIUM 40 MG TABS (PRAVASTATIN SODIUM) Take 2  tablets by mouth once a day  #60 x 6   Entered and Authorized by:   Margarito Liner MD   Signed by:   Margarito Liner MD on 04/17/2010   Method used:   Faxed to ...       [facility] Department (retail)       [address] [place], [place]  [address]       Ph: [phone]       Fax:  [phone]   RxID:   [id number] AMLODIPINE BESYLATE 10 MG TABS (AMLODIPINE BESYLATE) Take 1 tablet by mouth once a day  #30 x 6   Entered and Authorized by:   Margarito Liner MD   Signed by:   Margarito Liner MD on 04/17/2010   Method used:   Faxed to ...       [facility] Department (retail)       [address] [place], [place]  [address]       Ph: [phone]       Fax: [phone]   RxID:   [id number] LISINOPRIL 20 MG TABS (LISINOPRIL) Take 1 and 1/2  tablets by mouth once a day  #45 x 6   Entered and Authorized by:   Margarito Liner MD   Signed by:   Margarito Liner MD on 04/17/2010   Method used:   Faxed to ...       [facility] Department (retail)       [address] [place], [place]  [address]       Ph: [phone]       Fax: [phone]   RxID:   [id number]    Orders Added: 1)  T-PSA Total [[address]] 2)  T-CMP with Estimated GFR [[address]] 3)  Pneumococcal Vaccine [90732] 4)  Admin 1st Vaccine [90471] 5)  T- Capillary Blood Glucose [82948] 6)  T-Hgb A1C (in-house) [83036QW] 7)  Est. Patient Level IV [16967]   Immunizations Administered:  Pneumonia Vaccine:    Vaccine Type: Pneumovax    Site: right deltoid    Mfr: Merck    Dose: 0.5 ml    Route: IM    Given by: Angelina Ok RN    Exp. Date: 09/11/2011    Lot #: [id number]    VIS given: 04/21/09 version given April 15, 2010.   Immunizations Administered:  Pneumonia Vaccine:    Vaccine Type:  Pneumovax    Site: right deltoid    Mfr: Merck    Dose: 0.5 ml    Route: IM    Given by: Angelina Ok RN    Exp. Date: 09/11/2011    Lot #: [id number]    VIS given: 04/21/09 version given April 15, 2010.  Prevention & Chronic Care Immunizations   Influenza vaccine: Fluvax Non-MCR  (01/15/2010)   Influenza vaccine deferral: Deferred  (01/02/2010)    Tetanus booster: 01/31/2008: Tdap    Pneumococcal vaccine: Pneumovax  (04/15/2010)   Pneumococcal vaccine deferral:  Refused  (07/24/2009)  Colorectal Screening   Hemoccult: negative x 3  (04/08/2008)   Hemoccult action/deferral: Deferred  (01/02/2010)   Hemoccult due: 04/2009    Colonoscopy: Not documented   Colonoscopy action/deferral: GI referral  (01/15/2010)  Other Screening   PSA: 2.64  (01/31/2008)   PSA ordered.   PSA action/deferral: Discussed-PSA requested  (04/15/2010)  Reports requested:  Smoking status: never  (04/15/2010)    Screening comments: GI appointment already scheduled for screening colonoscopy.  Diabetes Mellitus   HgbA1C: 6.7  (04/15/2010)    Eye exam: No diabetic retinopathy.   Exam by Molly Maduro L. Dione Booze, MD   (03/12/2009)   Last eye exam report requested.   Diabetic eye exam action/deferral: Ophthalmology referral  (01/15/2010)   Eye exam due: 03/2010    Foot exam: yes  (04/15/2010)   Foot exam action/deferral: Do today   High risk foot: No  (04/15/2010)   Foot care education: Done  (04/15/2010)   Foot exam due: 04/16/2011    Urine microalbumin/creatinine ratio: 14.8  (01/15/2010)   Urine microalbumin action/deferral: Ordered    Diabetes flowsheet reviewed?: Yes   Progress toward A1C goal: At goal   Diabetes comments: Saw Dr. Hyacinth Meeker last week.  Lipids   Total Cholesterol: 166  (01/15/2010)   LDL: 108  (01/15/2010)   LDL Direct: Not documented   HDL: 32  (01/15/2010)   Triglycerides: 132  (01/15/2010)    SGOT (AST): 24  (01/15/2010)   SGPT (ALT): 37  (01/15/2010)   Alkaline phosphatase: 60  (01/15/2010)   Total bilirubin: 0.4  (01/15/2010)    Lipid flowsheet reviewed?: Yes   Progress toward LDL goal: Improved  Hypertension   Last Blood Pressure: 162 / 107  (04/15/2010)   Serum creatinine: 1.11  (01/15/2010)   Serum potassium 4.2  (01/15/2010)    Hypertension flowsheet reviewed?: Yes   Progress toward BP goal: Deteriorated  Self-Management Support :   Personal Goals (by the next clinic visit) :     Personal A1C goal: 7  (07/24/2009)      Personal blood pressure goal: 130/80  (07/24/2009)     Personal LDL goal: 100  (07/24/2009)    Patient will work on the following items until the next clinic visit to reach self-care goals:     Medications and monitoring: take my medicines every day, check my blood sugar, examine my feet every day  (04/15/2010)     Eating: drink diet soda or water instead of juice or soda, eat more vegetables, use fresh or frozen vegetables, eat foods that are low in salt, limit or avoid alcohol  (04/15/2010)     Activity: take a 30 minute walk every day  (01/15/2010)    Diabetes self-management support: Education handout, Written self-care plan  (04/15/2010)   Diabetes care plan printed   Diabetes education handout printed   Last diabetes self-management training by diabetes educator: 04/02/2009   Last medical nutrition  therapy: 05/06/2008    Hypertension self-management support: Education handout, Written self-care plan  (04/15/2010)   Hypertension self-care plan printed.   Hypertension education handout printed    Lipid self-management support: Education handout, Written self-care plan  (04/15/2010)   Lipid self-care plan printed.   Lipid education handout printed   Nursing Instructions: Diabetic foot exam today Request report of last diabetic eye exam Give Pneumovax today    Diabetic Foot Exam Foot Inspection Is there a history of a foot ulcer?              No Is there a foot ulcer now?              No Can the patient see the bottom of their feet?          Yes Are the shoes appropriate in style and fit?          Yes Is there swelling or an abnormal foot shape?          No Are the toenails long?                No Are the toenails thick?                No Are the toenails ingrown?              No Is there heavy callous build-up?              No Is there pain in the calf muscle (Intermittent claudication) when walking?    NoIs there a claw toe deformity?              No Is there elevated  skin temperature?            No Is there limited ankle dorsiflexion?            No Is there foot or ankle muscle weakness?            No  Diabetic Foot Care Education Patient educated on appropriate care of diabetic feet.  Pulse Check          Right Foot          Left Foot Posterior Tibial:        normal            normal Dorsalis Pedis:        normal            normal  High Risk Feet? No Set Next Diabetic Foot Exam here: 04/16/2011   10-g (5.07) Semmes-Weinstein Monofilament Test Performed by: Filomena Jungling NT II          Right Foot          Left Foot Visual Inspection               Test Control      normal         normal Site 1         normal         normal Site 2         normal         normal Site 3         normal         normal Site 4         normal         normal Site 5         normal  normal Site 6         normal         normal Site 7         normal         normal Site 8         normal         normal Site 9         normal         normal Site 10         normal         normal  Impression      normal         normal   Laboratory Results   Blood Tests   Date/Time Received: April 15, 2010 10:05 AM  Date/Time Reported: Burke Keels  April 15, 2010 10:05 AM   HGBA1C: 6.7%   (Normal Range: Non-Diabetic - 3-6%   Control Diabetic - 6-8%) CBG Fasting:: 136mg /dL    Process Orders Check Orders Results:     [facility]: ABN not required for this insurance Tests Sent for requisitioning (April 17, 2010 10:13 AM):     04/15/2010: [facility] -- T-PSA Total [[id number]] (signed)     04/15/2010: [facility] -- T-CMP with Estimated GFR [[id number]] (signed)

## 2010-06-16 NOTE — Progress Notes (Signed)
Summary: refill/gg  Phone Note Refill Request  on February 04, 2010 3:26 PM  Refills Requested: Medication #1:  LISINOPRIL 20 MG TABS Take 1 tablet by mouth once a day   Last Refilled: 01/02/2010  Method Requested: Fax to Local Pharmacy Initial call taken by: Merrie Roof RN,  February 04, 2010 3:28 PM  Follow-up for Phone Call        Refill approved-nurse to complete Follow-up by: Julaine Fusi  DO,  February 05, 2010 8:48 AM    Prescriptions: LISINOPRIL 20 MG TABS (LISINOPRIL) Take 1 tablet by mouth once a day  #30 x 0   Entered by:   Julaine Fusi  DO   Authorized by:   Margarito Liner MD   Signed by:   Julaine Fusi  DO on 02/05/2010   Method used:   Faxed to ...       [facility] Department (retail)       [address] [place], [place]  [address]       Ph: [phone]       Fax: [phone]   RxID:   [id number]

## 2010-06-16 NOTE — Progress Notes (Signed)
Summary: Change Lisinopril to Accupril  Phone Note Refill Request  on April 17, 2010 5:09 PM  Refills Requested: Medication #1:  LISINOPRIL 20 MG TABS Take 1 and 1/2  tablets by mouth once a day  Method Requested: Fax to Local Pharmacy Initial call taken by: Merrie Roof RN,  April 17, 2010 5:09 PM  Follow-up for Phone Call        Rx changed to Accupril as requested.  Rx faxed to pharmacy. Follow-up by: Margarito Liner MD,  April 17, 2010 5:18 PM    New/Updated Medications: ACCUPRIL 20 MG TABS (QUINAPRIL HCL) Take 1 and 1/2 tablets daily.  Prescriptions: ACCUPRIL 20 MG TABS (QUINAPRIL HCL) Take 1 and 1/2 tablets daily.  #45 x 6   Entered and Authorized by:   Margarito Liner MD   Signed by:   Margarito Liner MD on 04/17/2010   Method used:   Faxed to ...       [facility] Department (retail)       [address] [place], [place]  [address]       Ph: [phone]       Fax: [phone]   RxID:   [id number]

## 2010-06-16 NOTE — Assessment & Plan Note (Signed)
Summary: DIABETIC FU PER DONNA R./BOGGALA/DS   Vital Signs:  Patient profile:   55 year old male Height:      71 inches (180.34 cm) Weight:      319.3 pounds (138.32 kg) BMI:     42.59 Temp:     98.1 degrees F (36.72 degrees C) oral Pulse rate:   78 / minute BP sitting:   159 / 93  (right arm) Cuff size:   large  Vitals Entered By: Theotis Barrio NT II (July 24, 2009 8:47 AM) Is Patient Diabetic? Yes Did you bring your meter with you today? Yes Pain Assessment Patient in pain? no      Nutritional Status BMI of > 30 = obese  Have you ever been in a relationship where you felt threatened, hurt or afraid?No   Does patient need assistance? Functional Status Self care Ambulation Normal   Primary Care Provider:  Margarito Liner MD   History of Present Illness: Terry Parks is a 55 year old Male with PMH/problems as outlined in the EMR, who presents to the [facility] for follow up on his DM. Gained some weight, but now he is reading labels and trying to be more careful with it. Re: his numbers: range 120 to 200s. But mostly 130-150. He doesn't have any new complaints today.   Current Medications (verified): 1)  Atenolol 50 Mg Tabs (Atenolol) .... Take 1 Tablet By Mouth Once A Day 2)  Hydrochlorothiazide 25 Mg Tabs (Hydrochlorothiazide) .... Take 1 Tablet By Mouth Once A Day 3)  Amlodipine Besylate 5 Mg Tabs (Amlodipine Besylate) .... Take 1 Tablet By Mouth Once A Day 4)  Xyzal 5 Mg Tabs (Levocetirizine Dihydrochloride) .... Take 1 Tablet By Mouth Once A Day 5)  Cetirizine Hcl 10 Mg Tabs (Cetirizine Hcl) .... Take 1 Tablet By Mouth Once A Day 6)  Metformin Hcl 500 Mg Tabs (Metformin Hcl) .... Take 2 Tablets By Mouth Two Times A Day 7)  Lantus Solostar 100 Unit/ml Soln (Insulin Glargine) .... Take 18 Units of Insulin Subcutaneously At Bed Time, Increase By 2 Units Every 3 Days For Fasting Blood Sugars More Than 130 8)  Truetrack Test  Strp (Glucose Blood) .... Use To Test Blood Sugar  Three Times A Day Before Meals 9)  Lancets  Misc (Lancets) .... Use To Test Blood Sugar Three Times A Day Before Meals 10)  Omeprazole 20 Mg Cpdr (Omeprazole) .... Take 1 Capsule By Mouth Once A Day  Allergies (verified): No Known Drug Allergies  Past History:  Past Medical History: Last updated: 02/27/2009 Hyperlipidemia Hypertension Cardiomegaly Pleuritis Hyperglycemia Dermatitis Neck pain Diabetes Mellitus Type 2  Family History: Last updated: 06/02/2007 Breast cancer in mother. No colon cancer or prostate cancer.  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   Risk Factors: Caffeine Use: 0 (03/05/2009) Exercise: yes (03/05/2009)  Risk Factors: Smoking Status: never (04/02/2009)  Review of Systems       as per HPI  Physical Exam  General:  alert and overweight-appearing.   Lungs:  normal respiratory effort and normal breath sounds.   Heart:  normal rate and regular rhythm.   Abdomen:  soft and non-tender.   Pulses:  normal peripheral pulses  Extremities:  no cyanosis, clubbing or edema  Neurologic:  non focal Psych:  normally interactive.     Impression & Recommendations:  Problem # 1:  DM (ICD-250.00) DATA REVIEWED. A1c: >14.0 (03/05/2009 10:31:45 AM).  MICROALB/CR: 10.5 (02/27/2009 8:06:00 PM) LDL: 98 (02/27/2009  8:06:00 PM) EYE: No diabetic retinopathy.   Exam by Molly Maduro L. Dione Booze, MD  (03/12/2009 6:37:15 PM) FOOT: DONE TODAY, NORMAL EXAM WEIGHT: 42.59 (07/24/2009 8:40:29 AM)  Patient will get A1c and FLP done next week. His CBG are mostly in 120 -150, I will review his insulin dose after the blood work.  His updated medication list for this problem includes:    Metformin Hcl 500 Mg Tabs (Metformin hcl) .[name]... Take 2 tablets by mouth two times a day    Lantus Solostar 100 Unit/ml Soln (Insulin glargine) .[name]... Take 18 units of insulin subcutaneously at bed time, increase by 2 units every 3 days for fasting blood sugars more  than 130  Orders: T- Capillary Blood Glucose ([id number]) T-Hgb A1C (in-house) ([id number])Future Orders: T-Lipid Profile ([id number]) ... 07/28/2009 T-Comprehensive Metabolic Panel [id number]) ... 07/28/2009  Problem # 2:  HYPERTENSION (ICD-401.9) High BP confirmed manually. I will change his amlodipine to 10 mg. B-met todya.  His updated medication list for this problem includes:    Atenolol 50 Mg Tabs (Atenolol) .[name]... Take 1 tablet by mouth once a day    Hydrochlorothiazide 25 Mg Tabs (Hydrochlorothiazide) .[name]... Take 1 tablet by mouth once a day    Amlodipine Besylate 10 Mg Tabs (Amlodipine besylate) .[name]... Take 1 tablet by mouth once a day  Problem # 3:  HYPERLIPIDEMIA (ICD-272.4) Will check FLP, and consider statin.  Future Orders: T-Hgb A1C (in-house) ([id number]) ... 07/28/2009 T-Lipid Profile [id number]) ... 07/28/2009 T-Comprehensive Metabolic Panel [id number]) ... 07/28/2009  Complete Medication List: 1)  Atenolol 50 Mg Tabs (Atenolol) .... Take 1 tablet by mouth once a day 2)  Hydrochlorothiazide 25 Mg Tabs (Hydrochlorothiazide) .... Take 1 tablet by mouth once a day 3)  Amlodipine Besylate 10 Mg Tabs (Amlodipine besylate) .... Take 1 tablet by mouth once a day 4)  Xyzal 5 Mg Tabs (Levocetirizine dihydrochloride) .... Take 1 tablet by mouth once a day 5)  Cetirizine Hcl 10 Mg Tabs (Cetirizine hcl) .... Take 1 tablet by mouth once a day 6)  Metformin Hcl 500 Mg Tabs (Metformin hcl) .... Take 2 tablets by mouth two times a day 7)  Lantus Solostar 100 Unit/ml Soln (Insulin glargine) .... Take 18 units of insulin subcutaneously at bed time, increase by 2 units every 3 days for fasting blood sugars more than 130 8)  Truetrack Test Strp (Glucose blood) .... Use to test blood sugar three times a day before meals 9)  Lancets Misc (Lancets) .... Use to test blood sugar three times a day before meals 10)  Omeprazole 20 Mg Cpdr (Omeprazole) .... Take 1 capsule by mouth once a  day  Patient Instructions: 1)  Please schedule a follow-up appointment in 3 months. 2)  Please come back for a fasting blood test.  3)  We will let you know if anything wrong with your lab work.   Prescriptions: AMLODIPINE BESYLATE 10 MG TABS (AMLODIPINE BESYLATE) Take 1 tablet by mouth once a day  #30 x 5   Entered and Authorized by:   Zara Council MD   Signed by:   Zara Council MD on 07/24/2009   Method used:   Faxed to ...       [facility] (retail)       [address] [place], [place]  [address]       Ph: [phone]       Fax: [phone]   RxID:   [id number]  Process Orders Check  Orders Results:     [facility]: ABN not required for this insurance Tests Sent for requisitioning (July 24, 2009 9:17 AM):     07/28/2009: [facility] -- T-Lipid Profile [phone] (signed)     07/28/2009: [facility] -- T-Comprehensive Metabolic Panel [id number] (signed)    Prevention & Chronic Care Immunizations   Influenza vaccine: Fluvax Non-MCR  (03/05/2009)    Tetanus booster: 01/31/2008: Tdap    Pneumococcal vaccine: Not documented   Pneumococcal vaccine deferral: Refused  (07/24/2009)  Colorectal Screening   Hemoccult: negative x 3  (04/08/2008)   Hemoccult due: 04/2009    Colonoscopy: Not documented  Other Screening   PSA: 2.64  (01/31/2008)   Smoking status: never  (04/02/2009)  Diabetes Mellitus   HgbA1C: >14.0  (03/05/2009)    Eye exam: No diabetic retinopathy.   Exam by Molly Maduro L. Dione Booze, MD   (03/12/2009)   Eye exam due: 03/2010    Foot exam: Not documented   High risk foot: No  (07/24/2009)   Foot care education: Not documented   Foot exam due: 07/30/2010    Urine microalbumin/creatinine ratio: 10.5  (02/27/2009)    Diabetes flowsheet reviewed?: Yes   Progress toward A1C goal: Unchanged  Lipids   Total Cholesterol: 199  (02/27/2009)   LDL: 98  (02/27/2009)   LDL  Direct: Not documented   HDL: 25  (02/27/2009)   Triglycerides: 379  (02/27/2009)    SGOT (AST): 19  (02/27/2009)   SGPT (ALT): 27  (02/27/2009) CMP ordered    Alkaline phosphatase: 95  (02/27/2009)   Total bilirubin: 0.4  (02/27/2009)    Lipid flowsheet reviewed?: Yes   Progress toward LDL goal: Unchanged  Hypertension   Last Blood Pressure: 159 / 93  (07/24/2009)   Serum creatinine: 1.15  (02/27/2009)   Serum potassium 4.0  (02/27/2009) CMP ordered     Hypertension flowsheet reviewed?: Yes   Progress toward BP goal: Deteriorated  Self-Management Support :   Personal Goals (by the next clinic visit) :     Personal A1C goal: 7  (07/24/2009)     Personal blood pressure goal: 130/80  (07/24/2009)     Personal LDL goal: 100  (07/24/2009)    Patient will work on the following items until the next clinic visit to reach self-care goals:     Medications and monitoring: take my medicines every day, check my blood sugar, bring all of my medications to every visit, examine my feet every day  (07/24/2009)     Eating: eat more vegetables, use fresh or frozen vegetables, eat foods that are low in salt, limit or avoid alcohol  (07/24/2009)     Activity: take the stairs instead of the elevator, park at the far end of the parking lot  (07/24/2009)    Diabetes self-management support: Resources for patients handout  (07/24/2009)   Last diabetes self-management training by diabetes educator: 04/02/2009   Last medical nutrition therapy: 05/06/2008    Hypertension self-management support: Resources for patients handout  (07/24/2009)    Lipid self-management support: Resources for patients handout  (07/24/2009)        Resource handout printed.   Last LDL:                                                 98 (02/27/2009 8:06:00 PM)  Diabetic Foot Exam Pulse Check          Right Foot          Left Foot Posterior Tibial:        3+            3+ Dorsalis Pedis:        3+             3+  High Risk Feet? No Set Next Diabetic Foot Exam here: 07/30/2010   10-g (5.07) Semmes-Weinstein Monofilament Test Performed by: Terry Lasser MD          Right Foot          Left Foot Site 1         normal         normal Site 4         normal         normal Site 5         normal         normal Site 6         normal         normal

## 2010-06-16 NOTE — Progress Notes (Signed)
Summary: Refill/gh  Phone Note Refill Request Message from:  Fax from Pharmacy on March 05, 2010 2:02 PM  Refills Requested: Medication #1:  LISINOPRIL 20 MG TABS Take 1 tablet by mouth once a day   Last Refilled: 02/06/2010  Method Requested: Fax to Local Pharmacy Initial call taken by: Angelina Ok RN,  March 05, 2010 2:03 PM  Follow-up for Phone Call        Refill approved-nurse to complete. Follow-up by: Margarito Liner MD,  March 05, 2010 5:01 PM  Additional Follow-up for Phone Call Additional follow up Details #1::        Rx called to pharmacy - [facility] pharmacy. Additional Follow-up by: Chinita Pester RN,  March 09, 2010 11:32 AM    Prescriptions: LISINOPRIL 20 MG TABS (LISINOPRIL) Take 1 tablet by mouth once a day  #30 x 3   Entered and Authorized by:   Margarito Liner MD   Signed by:   Margarito Liner MD on 03/05/2010   Method used:   Telephoned to ...       [facility] (retail)       [address] [place], [place]  [address]       Ph: [phone]       Fax: [phone]   RxID:   [id number]

## 2010-06-16 NOTE — Assessment & Plan Note (Signed)
Summary: FU DM (BOGGALA)/DS   Vital Signs:  Patient profile:   55 year old male Height:      71 inches (180.34 cm) Weight:      307.4 pounds (139.73 kg) BMI:     43.03 Temp:     98.0 degrees F (36.67 degrees C) oral Pulse rate:   60 / minute BP sitting:   150 / 94  (left arm) Cuff size:   large  Vitals Entered By: Theotis Barrio NT II (January 15, 2010 9:30 AM) CC: FOLLOW UP ON HIGH BLOOD SUGAR / FLU SHOT / STATES HE IS FEELING MUCH BETTER Is Patient Diabetic? Yes Did you bring your meter with you today? Yes Pain Assessment Patient in pain? no      Nutritional Status BMI of > 30 = obese  Have you ever been in a relationship where you felt threatened, hurt or afraid?No   Does patient need assistance? Functional Status Self care Ambulation Normal   Primary Care Provider:  Margarito Liner MD  CC:  FOLLOW UP ON HIGH BLOOD SUGAR / FLU SHOT / STATES HE IS FEELING MUCH BETTER.  History of Present Illness: 55 yo m with PMH outlined in the EMR presents for f/u of his DM and HTN.  Pt recently resumed tx for DM, HTN, and HLD after being out of medication for 1-2 months 2/2 "stress and being out of town."  He is now taking 18units of Lantus every night with CBGs ranging in the 200s.  No episodes of hypoglycemia.  He is also taking metformin without adverse effect.  Review of CBG meter reveals CBG measurement are trending down from 400-600 to 200-300 over the past week.  HTN: Pt reports compliance with medications.  No adverse effects from lisinopril.  He notes his BP is high on arrival to clinic today but feels this is b/c he was rushing to arrive on time this am.  HLD: pt is taking pravastatin qd without adverse effect.   Patient is feeling well and denies CP, abdominal pain, nausea, vomiting, HA's, palpitations, blurred vision. fever, chills, diarrhea, constipation or SOB.  He has no other concerns or complaints today.  Preventive Screening-Counseling &  Management  Alcohol-Tobacco     Smoking Status: never  Caffeine-Diet-Exercise     Caffeine use/day: 0     Does Patient Exercise: yes     Type of exercise: walking  Current Medications (verified): 1)  Atenolol 50 Mg Tabs (Atenolol) .... Take 1 Tablet By Mouth Once A Day 2)  Lisinopril 20 Mg Tabs (Lisinopril) .... Take 1 Tablet By Mouth Once A Day 3)  Amlodipine Besylate 10 Mg Tabs (Amlodipine Besylate) .... Take 1 Tablet By Mouth Once A Day 4)  Metformin Hcl 500 Mg Tabs (Metformin Hcl) .... Take 1 Tablet By Mouth Two Times A Day 5)  Lantus Solostar 100 Unit/ml Soln (Insulin Glargine) .... Take 12 Units of Insulin Subcutaneously At Bed Time, Increase By 2 Units Every 3 Days For Fasting Blood Sugars More Than 130 6)  Truetrack Test  Strp (Glucose Blood) .... Use To Test Blood Sugar Three Times A Day Before Meals 7)  Lancets  Misc (Lancets) .... Use To Test Blood Sugar Three Times A Day Before Meals 8)  Omeprazole 20 Mg Cpdr (Omeprazole) .... Take 1 Capsule By Mouth Once A Day 9)  Pravastatin Sodium 40 Mg Tabs (Pravastatin Sodium) .... Take 1 Tablet By Mouth Once A Day  Allergies (verified): No Known Drug Allergies  Past History:  Past medical, surgical, family and social histories (including risk factors) reviewed for relevance to current acute and chronic problems.  Past Medical History: Reviewed history from 02/27/2009 and no changes required. Hyperlipidemia Hypertension Cardiomegaly Pleuritis Hyperglycemia Dermatitis Neck pain Diabetes Mellitus Type 2  Family History: Reviewed history from 06/02/2007 and no changes required. Breast cancer in mother. No colon cancer or prostate cancer.  Social History: Reviewed history from 10/18/2008 and no changes required. 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   Physical Exam  General:  alert and overweight-appearing.   Head:  Normocephalic and atraumatic without obvious abnormalities.  Eyes:  No corneal or conjunctival inflammation  noted. EOMI. PERRLA. Vision grossly normal.  Anicteric Mouth:  Oral mucosa and oropharynx without lesions or exudates. pharynx pink and moist.   Neck:  supple and full ROM.   Lungs:  normal respiratory effort, no intercostal retractions, no accessory muscle use, and normal breath sounds.   Heart:  normal rate and regular rhythm.   Abdomen:  soft and non-tender.  normal bowel sounds, no distention, and no guarding.   Extremities:  no cyanosis, clubbing or edema  Neurologic:  alert & oriented X3, cranial nerves II-XII intact, strength normal in all extremities, and gait normal.   Skin:  turgor normal, color normal, and no rashes.   Psych:  Oriented X3, memory intact for recent and remote, normally interactive, and good eye contact.     Impression & Recommendations:  Problem # 1:  DM (ICD-250.00) Pt is tolerating his current DM regimen well and reports full compliance with meds.  Will obtain microalb/cr ratio today and refer pt for annual eye exam.  Will continue to monitor his progress closely and repeat A1c in 2months.  Advised pt to call the clinic with any concerns or questions re: Insulin.   His updated medication list for this problem includes:    Lisinopril 20 Mg Tabs (Lisinopril) .[name]... Take 1 tablet by mouth once a day    Metformin Hcl 500 Mg Tabs (Metformin hcl) .[name]... Take 1 tablet by mouth two times a day    Lantus Solostar 100 Unit/ml Soln (Insulin glargine) .[name]... Take 12 units of insulin subcutaneously at bed time, increase by 2 units every 3 days for fasting blood sugars more than 130  Orders: T-Urine Microalbumin w/creat. ratio [id number]) Ophthalmology Referral (Ophthalmology)  Labs Reviewed: Creat: 1.27 (07/31/2009)     Last Eye Exam: No diabetic retinopathy.   Exam by Molly Maduro L. Dione Booze, MD  (03/12/2009) Reviewed HgBA1c results: >14.0 (01/02/2010)  7.5 (07/31/2009)  Problem # 2:  HYPERTENSION (ICD-401.9) BP seems fairly stable and above goal.  WIll repeat  measurement and f/u at next visit.  Will continue current anti-HTN regimen for now and consider change, if indicated,at his next appt.  WIll check CMET today to evaluate electolyte and renal function.  His updated medication list for this problem includes:    Atenolol 50 Mg Tabs (Atenolol) .[name]... Take 1 tablet by mouth once a day    Lisinopril 20 Mg Tabs (Lisinopril) .[name]... Take 1 tablet by mouth once a day    Amlodipine Besylate 10 Mg Tabs (Amlodipine besylate) .[name]... Take 1 tablet by mouth once a day  Orders: T-Comprehensive Metabolic Panel [id number]) T-Lipid Profile ([id number])  BP today: 150/94 Prior BP: 148/87 (01/02/2010)  Labs Reviewed: K+: 4.0 (07/31/2009) Creat: : 1.27 (07/31/2009)   Chol: 201 (07/31/2009)   HDL: 28 (07/31/2009)   LDL: 128 (07/31/2009)   TG: 225 (07/31/2009)  Problem # 3:  HYPERLIPIDEMIA (ICD-272.4)  Pt tolerating pravastatin without adverse effects.  Will check lipid panel today as pt is currently fasting, will also check LFTs today.  His updated medication list for this problem includes:    Pravastatin Sodium 40 Mg Tabs (Pravastatin sodium) .[name]... Take 1 tablet by mouth once a day  Orders: T-Comprehensive Metabolic Panel [id number]) T-Lipid Profile [id number])  Labs Reviewed: SGOT: 19 (07/31/2009)   SGPT: 29 (07/31/2009)   HDL:28 (07/31/2009), 25 (02/27/2009)  LDL:128 (07/31/2009), 98 (24/40/1027)  Chol:201 (07/31/2009), 199 (02/27/2009)  Trig:225 (07/31/2009), 379 (02/27/2009)  Problem # 4:  Preventive Health Care (ICD-V70.0)  Reviewed preventive care protocols, scheduled due services, and updated immunizations. Will give flu vax today. Will refer for screening colonoscopy today.  Problem # 5:  SPECIAL SCREENING FOR MALIGNANT NEOPLASMS COLON (ICD-V76.51) Will refer to GI for screening colonoscopy; pt has not had a screening colonoscopy previously.  Complete Medication List: 1)  Atenolol 50 Mg Tabs (Atenolol) .... Take 1 tablet by mouth once  a day 2)  Lisinopril 20 Mg Tabs (Lisinopril) .... Take 1 tablet by mouth once a day 3)  Amlodipine Besylate 10 Mg Tabs (Amlodipine besylate) .... Take 1 tablet by mouth once a day 4)  Metformin Hcl 500 Mg Tabs (Metformin hcl) .... Take 1 tablet by mouth two times a day 5)  Lantus Solostar 100 Unit/ml Soln (Insulin glargine) .... Take 12 units of insulin subcutaneously at bed time, increase by 2 units every 3 days for fasting blood sugars more than 130 6)  Truetrack Test Strp (Glucose blood) .... Use to test blood sugar three times a day before meals 7)  Lancets Misc (Lancets) .... Use to test blood sugar three times a day before meals 8)  Omeprazole 20 Mg Cpdr (Omeprazole) .... Take 1 capsule by mouth once a day 9)  Pravastatin Sodium 40 Mg Tabs (Pravastatin sodium) .... Take 1 tablet by mouth once a day  Other Orders: Influenza Vaccine NON MCR ([id number]) Gastroenterology Referral (GI)  Patient Instructions: 1)  Please schedule a follow-up appointment in 1 month with Dr. Meredith Pel. 2)  Keep taking your medicines regularly.   3)  Call the clinic with any questions or concerns at [phone]. 4)  We will refer you for your yearly diabetic eye exam and for a colonoscopy to screen for cancer. Process Orders Check Orders Results:     [facility]: ABN not required for this insurance Tests Sent for requisitioning (January 15, 2010 2:47 PM):     01/15/2010: [facility] -- T-Comprehensive Metabolic Panel [[id number]] (signed)     01/15/2010: [facility] -- T-Lipid Profile [id number] (signed)     01/15/2010: [facility] -- T-Urine Microalbumin w/creat. ratio [[id number]] (signed)     Prevention & Chronic Care Immunizations   Influenza vaccine: Fluvax Non-MCR  (01/15/2010)   Influenza vaccine deferral: Deferred  (01/02/2010)    Tetanus booster: 01/31/2008: Tdap    Pneumococcal vaccine: Not documented   Pneumococcal vaccine  deferral: Refused  (07/24/2009)  Colorectal Screening   Hemoccult: negative x 3  (04/08/2008)   Hemoccult action/deferral: Deferred  (01/02/2010)   Hemoccult due: 04/2009    Colonoscopy: Not documented   Colonoscopy action/deferral: GI referral  (01/15/2010)  Other Screening   PSA: 2.64  (01/31/2008)   PSA action/deferral: Discussion deferred  (01/02/2010)   Smoking status: never  (01/15/2010)  Diabetes Mellitus   HgbA1C: >14.0  (01/02/2010)    Eye exam: No diabetic retinopathy.   Exam by Molly Maduro L. Dione Booze, MD   (  03/12/2009)   Diabetic eye exam action/deferral: Ophthalmology referral  (01/15/2010)   Eye exam due: 03/2010    Foot exam: Not documented   High risk foot: No  (07/24/2009)   Foot care education: Not documented   Foot exam due: 07/30/2010    Urine microalbumin/creatinine ratio: 10.5  (02/27/2009)   Urine microalbumin action/deferral: Ordered    Diabetes flowsheet reviewed?: Yes   Progress toward A1C goal: Deteriorated  Lipids   Total Cholesterol: 201  (07/31/2009)   LDL: 128  (07/31/2009)   LDL Direct: Not documented   HDL: 28  (07/31/2009)   Triglycerides: 225  (07/31/2009)    SGOT (AST): 19  (07/31/2009)   SGPT (ALT): 29  (07/31/2009) CMP ordered    Alkaline phosphatase: 56  (07/31/2009)   Total bilirubin: 0.4  (07/31/2009)    Lipid flowsheet reviewed?: Yes   Progress toward LDL goal: Unchanged  Hypertension   Last Blood Pressure: 150 / 94  (01/15/2010)   Serum creatinine: 1.27  (07/31/2009)   Serum potassium 4.0  (07/31/2009) CMP ordered     Hypertension flowsheet reviewed?: Yes   Progress toward BP goal: Unchanged  Self-Management Support :   Personal Goals (by the next clinic visit) :     Personal A1C goal: 7  (07/24/2009)     Personal blood pressure goal: 130/80  (07/24/2009)     Personal LDL goal: 100  (07/24/2009)    Patient will work on the following items until the next clinic visit to reach self-care goals:     Medications and  monitoring: take my medicines every day, check my blood sugar, bring all of my medications to every visit, examine my feet every day  (01/15/2010)     Eating: drink diet soda or water instead of juice or soda, eat more vegetables, use fresh or frozen vegetables, eat foods that are low in salt, eat baked foods instead of fried foods, eat fruit for snacks and desserts, limit or avoid alcohol  (01/15/2010)     Activity: take a 30 minute walk every day  (01/15/2010)    Diabetes self-management support: Resources for patients handout  (01/15/2010)   Last diabetes self-management training by diabetes educator: 04/02/2009   Last medical nutrition therapy: 05/06/2008    Hypertension self-management support: Resources for patients handout  (01/15/2010)    Lipid self-management support: Resources for patients handout  (01/15/2010)        Resource handout printed.   Nursing Instructions: Give Flu vaccine today GI referral for screening colonoscopy (see order) Refer for screening diabetic eye exam (see order)     Immunizations Administered:  Influenza Vaccine # 1:    Vaccine Type: Fluvax Non-MCR    Site: right deltoid    Mfr: GlaxoSmithKline    Dose: 0.5 ml    Route: IM    Given by: Cynda Familia (AAMA)    Exp. Date: 11/14/2010    Lot #: [id number]    VIS given: 12/09/09 version given January 15, 2010.  Flu Vaccine Consent Questions:    Do you have a history of severe allergic reactions to this vaccine? no    Any prior history of allergic reactions to egg and/or gelatin? no    Do you have a sensitivity to the preservative Thimersol? no    Do you have a past history of Guillan-Barre Syndrome? no    Do you currently have an acute febrile illness? no    Have you ever had a severe reaction to latex? no  Vaccine information given and explained to patient? yes

## 2010-06-16 NOTE — Letter (Signed)
Summary: Diabetic Instructions  [facility]  [address] [place], [place] [address]   Phone: [phone]  Fax: [phone]    [name] Jul 15, 1955 MRN: [id number]   _ x _   ORAL DIABETIC MEDICATION INSTRUCTIONS  The day before your procedure:   Take your diabetic pill as you do normally  The day of your procedure:   Do not take your diabetic pill    We will check your blood sugar levels during the admission process and again in Recovery before discharging you home  ________________________________________________________________________  _x  _   INSULIN (LONG ACTING) MEDICATION INSTRUCTIONS (Lantus, NPH, 70/30, Humulin, Novolin-N)   The day before your procedure:   Take  your regular evening dose    The day of your procedure:   Do not take your morning dose

## 2010-06-16 NOTE — Letter (Signed)
Summary: LOG BOOK REPORT 08/03-09/05/2009  LOG BOOK REPORT 08/03-09/05/2009   Imported By: Shon Hough 02/02/2010 16:58:48  _____________________________________________________________________  External Attachment:    Type:   Image     Comment:   External Document

## 2010-06-16 NOTE — Miscellaneous (Signed)
  pt scrotal absess bursted, clear fluid with blood came out, pt denies fever chills, no need for abx now. also sugars are high, advised the patient to increase lantus from 12 units to 15 units.   Clinical Lists Changes

## 2010-06-16 NOTE — Letter (Signed)
Summary: [facility] Instructions  [facility]  [address] [place], [place] [address]   Phone: [phone]  Fax: [phone]       ELMIN WIEDERHOLT    1956-02-14    MRN: [id number]       Procedure Day Dorna Bloom:  Farrell Ours  05/01/10     Arrival Time:   10:30AM     Procedure Time:  11:30AM     Location of Procedure:                    _ X_  [facility] (4th Floor)   PREPARATION FOR COLONOSCOPY WITH MIRALAX  Starting 5 days prior to your procedure 04/26/10 do not eat nuts, seeds, popcorn, corn, beans, peas,  salads, or any raw vegetables.  Do not take any fiber supplements (e.g. Metamucil, Citrucel, and Benefiber). ____________________________________________________________________________________________________   THE DAY BEFORE YOUR PROCEDURE         DATE: 04/30/10  DAY: THURSDAY  1   Drink clear liquids the entire day-NO SOLID FOOD  2   Do not drink anything colored red or purple.  Avoid juices with pulp.  No orange juice.  3   Drink at least 64 oz. (8 glasses) of fluid/clear liquids during the day to prevent dehydration and help the prep work efficiently.  CLEAR LIQUIDS INCLUDE: Water Jello Ice Popsicles Tea (sugar ok, no milk/cream) Powdered fruit flavored drinks Coffee (sugar ok, no milk/cream) Gatorade Juice: apple, white grape, white cranberry  Lemonade Clear bullion, consomm, broth Carbonated beverages (any kind) Strained chicken noodle soup Hard Candy  4   Mix the entire bottle of Miralax with 64 oz. of Gatorade/Powerade in the morning and put in the refrigerator to chill.  5   At 3:00 pm take 2 Dulcolax/Bisacodyl tablets.  6   At 4:30 pm take one Reglan/Metoclopramide tablet.  7  Starting at 5:00 pm drink one 8 oz glass of the Miralax mixture every 15-20 minutes until you have finished drinking the entire 64 oz.  You should finish drinking prep around 7:30 or 8:00 pm.  8   If you are nauseated, you may take the 2nd  Reglan/Metoclopramide tablet at 6:30 pm.        9    At 8:00 pm take 2 more DULCOLAX/Bisacodyl tablets.     THE DAY OF YOUR PROCEDURE      DATE:  05/01/10   DAY:  Farrell Ours  You may drink clear liquids until 9:30AM  (2 HOURS BEFORE PROCEDURE).   MEDICATION INSTRUCTIONS  Unless otherwise instructed, you should take regular prescription medications with a small sip of water as early as possible the morning of your procedure.  Diabetic patients - see separate instructions.         OTHER INSTRUCTIONS  You will need a responsible adult at least 53 years of age to accompany you and drive you home.   This person must remain in the waiting room during your procedure.  Wear loose fitting clothing that is easily removed.  Leave jewelry and other valuables at home.  However, you may wish to bring a book to read or an iPod/MP3 player to listen to music as you wait for your procedure to start.  Remove all body piercing jewelry and leave at home.  Total time from sign-in until discharge is approximately 2-3 hours.  You should go home directly after your procedure and rest.  You can resume normal activities the day after your procedure.  The day  of your procedure you should not:   Drive   Make legal decisions   Operate machinery   Drink alcohol   Return to work  You will receive specific instructions about eating, activities and medications before you leave.   The above instructions have been reviewed and explained to me by   Wyona Almas RN  April 17, 2010 11:31 AM     I fully understand and can verbalize these instructions _____________________________ Date _______

## 2010-06-16 NOTE — Letter (Signed)
Summary: Pharmacologist   Imported By: Florinda Marker 07/29/2009 09:58:24  _____________________________________________________________________  External Attachment:    Type:   Image     Comment:   External Document

## 2010-06-16 NOTE — Progress Notes (Signed)
Summary: change lisinopril/ hla  Phone Note From Pharmacy   Summary of Call: [facility] can provide accupril 20mg  at no charge, would you consider changing from lisinopril to accupril? Initial call taken by: Marin Roberts RN,  March 10, 2010 10:24 AM  Follow-up for Phone Call        Medication changed as requested.  Prescription approved - nurse to complete. Follow-up by: Margarito Liner MD,  March 10, 2010 6:00 PM  Additional Follow-up for Phone Call Additional follow up Details #1::        Pharmacist called Additional Follow-up by: Marin Roberts RN,  March 12, 2010 11:49 AM    New/Updated Medications: ACCUPRIL 20 MG TABS (QUINAPRIL HCL) Take 1 tablet by mouth once a day  Prescriptions: ACCUPRIL 20 MG TABS (QUINAPRIL HCL) Take 1 tablet by mouth once a day  #30 x 3   Entered and Authorized by:   Margarito Liner MD   Signed by:   Margarito Liner MD on 03/10/2010   Method used:   Telephoned to ...       [facility] Department (retail)       [address] [place], [place]  [address]       Ph: [phone]       Fax: [phone]   RxID:   [id number]

## 2010-06-16 NOTE — Progress Notes (Signed)
Summary: diabetes support/dmr  Phone Note Outgoing Call   Call placed by: Jamison Neighbor RD,CDE,  May 19, 2009 9:41 AM Summary of Call: due for an appointment, called to follow-up:had tried to schdule an appointment, but was told to call back.  still needs to get paperwork to [place] to be able to get insulin. transferred to scheduler to schedule follow up with doctor

## 2010-06-16 NOTE — Assessment & Plan Note (Signed)
Summary: OUT OF DM MEDS/BOGGALA/DS   Vital Signs:  Patient profile:   55 year old male Height:      71 inches (180.34 cm) Weight:      285.2 pounds (145.14 kg) BMI:     44.69 Temp:     96.8 degrees F (36 degrees C) oral Pulse rate:   103 / minute BP sitting:   148 / 87  (left arm) Cuff size:   regular  Vitals Entered By: Theotis Barrio NT II (January 02, 2010 10:30 AM) CC: ADD ON MEDICATION REFILL   /  PATIENT IS FASTING Is Patient Diabetic? Yes Did you bring your meter with you today? Yes Nutritional Status BMI of > 30 = obese  Have you ever been in a relationship where you felt threatened, hurt or afraid?No   Does patient need assistance? Functional Status Self care Ambulation Normal Comments MEDICATION REFILL   /  PATIENT IS FASTING   Primary Care Provider:  Margarito Liner MD  CC:  ADD ON MEDICATION REFILL   /  PATIENT IS FASTING.  History of Present Illness: 55 yo m presents to [facility] for scrotal mass, medication refills, DM followup.  pt was out of meds and insulin for one month, reason was that he was out of town.   Patient started having the scrotal mass several days ago, its on his right scrotum, 1x1cm non tender without significant amount of pus in it.   DM: patient has not been taking his insulin, would like to be restarted on it, has not complaints due to his insulin .  HTN: patient is taking some of his pills, but not all of them, has no symptoms.   HLD: no taking his statin.   Patient is feeling well and denies CP, abdominal pain, nausea, vomiting, HA's, palpitations, blurred vision. fever, chills, diarrhea, constipation or SOB.   Preventive Screening-Counseling & Management  Alcohol-Tobacco     Smoking Status: never  Caffeine-Diet-Exercise     Caffeine use/day: 0     Does Patient Exercise: yes     Type of exercise: walking  Current Medications (verified): 1)  Atenolol 50 Mg Tabs (Atenolol) .... Take 1 Tablet By Mouth Once A Day 2)  Lisinopril 20 Mg  Tabs (Lisinopril) .... Take 1 Tablet By Mouth Once A Day 3)  Amlodipine Besylate 10 Mg Tabs (Amlodipine Besylate) .... Take 1 Tablet By Mouth Once A Day 4)  Metformin Hcl 500 Mg Tabs (Metformin Hcl) .... Take 2 Tablets By Mouth Two Times A Day 5)  Lantus Solostar 100 Unit/ml Soln (Insulin Glargine) .... Take 12 Units of Insulin Subcutaneously At Bed Time, Increase By 2 Units Every 3 Days For Fasting Blood Sugars More Than 130 6)  Truetrack Test  Strp (Glucose Blood) .... Use To Test Blood Sugar Three Times A Day Before Meals 7)  Lancets  Misc (Lancets) .... Use To Test Blood Sugar Three Times A Day Before Meals 8)  Omeprazole 20 Mg Cpdr (Omeprazole) .... Take 1 Capsule By Mouth Once A Day 9)  Pravastatin Sodium 40 Mg Tabs (Pravastatin Sodium) .... Take 1 Tablet By Mouth Once A Day  Allergies (verified): No Known Drug Allergies  Review of Systems       As per HPI.   Physical Exam  General:  alert and overweight-appearing.   Lungs:  normal respiratory effort and normal breath sounds.   Heart:  normal rate and regular rhythm.   Abdomen:  soft and non-tender.   Genitalia:  right  scrotal absess. 1x1 cm, nontender, no pus.   Impression & Recommendations:  Problem # 1:  GROIN ABSCESS (ICD-682.2) Assessment New pt denies high risk sexual activity, scrotal absess probably 2/2 to irritation of the region, for now will monitor, if symptoms will get worse will give abx/ drain the lesion.   Problem # 2:  DM (ICD-250.00) Assessment: Deteriorated  a1c >14 today, not on his insulin, will restart and urge the patient to come back in 2 weeks for a followup.   His updated medication list for this problem includes:    Lisinopril 20 Mg Tabs (Lisinopril) .[name]... Take 1 tablet by mouth once a day    Metformin Hcl 500 Mg Tabs (Metformin hcl) .[name]... Take 2 tablets by mouth two times a day    Lantus Solostar 100 Unit/ml Soln (Insulin glargine) .[name]... Take 12 units of insulin subcutaneously at bed time,  increase by 2 units every 3 days for fasting blood sugars more than 130  Orders: T- Capillary Blood Glucose (82948) T-Hgb A1C (in-house) (13244WN)  Problem # 3:  HYPERTENSION (ICD-401.9) Assessment: Deteriorated will restart his meds, with change of his HCTZ to Lisinopril, will bring back in 2 weeks for BMET.   His updated medication list for this problem includes:    Atenolol 50 Mg Tabs (Atenolol) .[name]... Take 1 tablet by mouth once a day    Lisinopril 20 Mg Tabs (Lisinopril) .[name]... Take 1 tablet by mouth once a day    Amlodipine Besylate 10 Mg Tabs (Amlodipine besylate) .[name]... Take 1 tablet by mouth once a day  Problem # 4:  HYPERLIPIDEMIA (ICD-272.4) Assessment: Comment Only will restart stain, and recheck FLP and LFT in 1-2 months.   His updated medication list for this problem includes:    Pravastatin Sodium 40 Mg Tabs (Pravastatin sodium) .[name]... Take 1 tablet by mouth once a day  Complete Medication List: 1)  Atenolol 50 Mg Tabs (Atenolol) .... Take 1 tablet by mouth once a day 2)  Lisinopril 20 Mg Tabs (Lisinopril) .... Take 1 tablet by mouth once a day 3)  Amlodipine Besylate 10 Mg Tabs (Amlodipine besylate) .... Take 1 tablet by mouth once a day 4)  Metformin Hcl 500 Mg Tabs (Metformin hcl) .... Take 2 tablets by mouth two times a day 5)  Lantus Solostar 100 Unit/ml Soln (Insulin glargine) .... Take 12 units of insulin subcutaneously at bed time, increase by 2 units every 3 days for fasting blood sugars more than 130 6)  Truetrack Test Strp (Glucose blood) .... Use to test blood sugar three times a day before meals 7)  Lancets Misc (Lancets) .... Use to test blood sugar three times a day before meals 8)  Omeprazole 20 Mg Cpdr (Omeprazole) .... Take 1 capsule by mouth once a day 9)  Pravastatin Sodium 40 Mg Tabs (Pravastatin sodium) .... Take 1 tablet by mouth once a day  Patient Instructions: 1)  Please schedule a follow-up appointment in 2 weeks for labs, blood pressure check,  and diabetic followup.  2)  Please stop taking your HCTZ 3)  please take your new medication Lisinopril that was sent to your pharmacy.  Prescriptions: LISINOPRIL 20 MG TABS (LISINOPRIL) Take 1 tablet by mouth once a day  #30 x 0   Entered and Authorized by:   Darnelle Maffucci MD   Signed by:   Darnelle Maffucci MD on 01/02/2010   Method used:   Faxed to ...       [facility] Department (retail)  [address] [place], [place]  [address]       Ph: [phone]       Fax: [phone]   RxID:   [id number] LANCETS  MISC (LANCETS) use to test blood sugar three times a day before meals  #100 x 11   Entered and Authorized by:   Darnelle Maffucci MD   Signed by:   Darnelle Maffucci MD on 01/02/2010   Method used:   Faxed to ...       [facility] Department (retail)       [address] [place], [place]  [address]       Ph: [phone]       Fax: [phone]   RxID:   [id number] OACZYSAYT TEST  STRP (GLUCOSE BLOOD) use to test blood sugar three times a day before meals  #100 x 11   Entered and Authorized by:   Darnelle Maffucci MD   Signed by:   Darnelle Maffucci MD on 01/02/2010   Method used:   Faxed to ...       [facility] Department (retail)       [address] [place], [place]  [address]       Ph: [phone]       Fax: [phone]   RxID:   [id number] METFORMIN HCL 500 MG TABS (METFORMIN HCL) Take 2 tablets by mouth two times a day  #60 x 5   Entered and Authorized by:   Darnelle Maffucci MD   Signed by:   Darnelle Maffucci MD on 01/02/2010   Method used:   Faxed to ...       [facility] Department (retail)       [address] [place], [place]  [address]       Ph: [phone]       Fax: [phone]   RxID:   [id number] PRAVASTATIN SODIUM 40 MG TABS (PRAVASTATIN SODIUM) Take 1 tablet by mouth once a day  #30 x 6   Entered and Authorized by:   Darnelle Maffucci MD   Signed by:   Darnelle Maffucci MD on  01/02/2010   Method used:   Faxed to ...       [facility] Department (retail)       [address] [place], [place]  [address]       Ph: [phone]       Fax: [phone]   RxID:   [id number] AMLODIPINE BESYLATE 10 MG TABS (AMLODIPINE BESYLATE) Take 1 tablet by mouth once a day  #30 x 5   Entered and Authorized by:   Darnelle Maffucci MD   Signed by:   Darnelle Maffucci MD on 01/02/2010   Method used:   Faxed to ...       [facility] Department (retail)       [address] [place], [place]  [address]       Ph: [phone]       Fax: [phone]   RxID:   [id number] ATENOLOL 50 MG TABS (ATENOLOL) Take 1 tablet by mouth once a day  #30 x 9   Entered and Authorized by:   Darnelle Maffucci MD   Signed by:   Darnelle Maffucci MD on 01/02/2010   Method used:  Faxed to ...       [facility] Department (retail)       [address] [place], [place]  [address]       Ph: [phone]       Fax: [phone]   RxID:   [id number] LANTUS SOLOSTAR 100 UNIT/ML SOLN (INSULIN GLARGINE) Take 12 units of Insulin subcutaneously at bed time, increase by 2 units every 3 days for fasting blood sugars more than 130  #1 month x 12   Entered and Authorized by:   Darnelle Maffucci MD   Signed by:   Darnelle Maffucci MD on 01/02/2010   Method used:   Faxed to ...       [facility] Department (retail)       [address] [place], [place]  [address]       Ph: [phone]       Fax: [phone]   RxID:   [id number]    Prevention & Chronic Care Immunizations   Influenza vaccine: Fluvax Non-MCR  (03/05/2009)   Influenza vaccine deferral: Deferred  (01/02/2010)    Tetanus booster: 01/31/2008: Tdap    Pneumococcal vaccine: Not documented   Pneumococcal vaccine deferral: Refused  (07/24/2009)  Colorectal Screening   Hemoccult: negative x 3  (04/08/2008)   Hemoccult action/deferral: Deferred  (01/02/2010)   Hemoccult due:  04/2009    Colonoscopy: Not documented   Colonoscopy action/deferral: Deferred  (01/02/2010)  Other Screening   PSA: 2.64  (01/31/2008)   PSA action/deferral: Discussion deferred  (01/02/2010)   Smoking status: never  (01/02/2010)  Diabetes Mellitus   HgbA1C: >14.0  (01/02/2010)    Eye exam: No diabetic retinopathy.   Exam by Molly Maduro L. Dione Booze, MD   (03/12/2009)   Eye exam due: 03/2010    Foot exam: Not documented   High risk foot: No  (07/24/2009)   Foot care education: Not documented   Foot exam due: 07/30/2010    Urine microalbumin/creatinine ratio: 10.5  (02/27/2009)    Diabetes flowsheet reviewed?: Yes   Progress toward A1C goal: Deteriorated  Lipids   Total Cholesterol: 201  (07/31/2009)   LDL: 128  (07/31/2009)   LDL Direct: Not documented   HDL: 28  (07/31/2009)   Triglycerides: 225  (07/31/2009)    SGOT (AST): 19  (07/31/2009)   SGPT (ALT): 29  (07/31/2009)   Alkaline phosphatase: 56  (07/31/2009)   Total bilirubin: 0.4  (07/31/2009)    Lipid flowsheet reviewed?: Yes   Progress toward LDL goal: Unchanged  Hypertension   Last Blood Pressure: 148 / 87  (01/02/2010)   Serum creatinine: 1.27  (07/31/2009)   Serum potassium 4.0  (07/31/2009)    Hypertension flowsheet reviewed?: Yes   Progress toward BP goal: Deteriorated  Self-Management Support :   Personal Goals (by the next clinic visit) :     Personal A1C goal: 7  (07/24/2009)     Personal blood pressure goal: 130/80  (07/24/2009)     Personal LDL goal: 100  (07/24/2009)    Patient will work on the following items until the next clinic visit to reach self-care goals:     Medications and monitoring: take my medicines every day, bring all of my medications to every visit, examine my feet every day  (01/02/2010)     Eating: eat more vegetables, use fresh or frozen vegetables, eat foods that are low in salt, limit or avoid alcohol  (01/02/2010)  Activity: take the stairs instead of the elevator, park  at the far end of the parking lot  (01/02/2010)    Diabetes self-management support: Resources for patients handout  (01/02/2010)   Last diabetes self-management training by diabetes educator: 04/02/2009   Last medical nutrition therapy: 05/06/2008    Hypertension self-management support: Resources for patients handout  (01/02/2010)    Lipid self-management support: Resources for patients handout  (01/02/2010)        Resource handout printed.  Laboratory Results   Blood Tests   Date/Time Received: January 02, 2010 10:59 AM Date/Time Reported: Alric Quan  January 02, 2010 11:00 AM   HGBA1C: >14.0%   (Normal Range: Non-Diabetic - 3-6%   Control Diabetic - 6-8%) CBG Fasting:: 490mg /dL  Comments: Results reported to Dr Gilford Rile by Lise Auer, PBT  Alric Quan  January 02, 2010 11:00 AM

## 2010-06-16 NOTE — Letter (Signed)
Summary: Pre Visit Letter Revised  [facility]  [address] [place], [place] [address]   Phone: [phone]  Fax: [phone]        03/18/2010 MRN: [id number]  Terry Parks [address] [address] [place], [place]  [address]             Procedure Date: 12-16 at 11:30am           Dr Juanda Chance   Welcome to the Gastroenterology Division at [facility].    You are scheduled to see a nurse for your pre-procedure visit on 04-17-10 at 11am on the 3rd floor at [facility], [address]  We ask that you try to arrive at our office 15 minutes prior to your appointment time to allow for check-in.  Please take a minute to review the attached form.  If you answer "Yes" to one or more of the questions on the first page, we ask that you call the person listed at your earliest opportunity.  If you answer "No" to all of the questions, please complete the rest of the form and bring it to your appointment.    Your nurse visit will consist of discussing your medical and surgical history, your immediate family medical history, and your medications.   If you are unable to list all of your medications on the form, please bring the medication bottles to your appointment and we will list them.  We will need to be aware of both prescribed and over the counter drugs.  We will need to know exact dosage information as well.    Please be prepared to read and sign documents such as consent forms, a financial agreement, and acknowledgement forms.  If necessary, and with your consent, a friend or relative is welcome to sit-in on the nurse visit with you.  Please bring your insurance card so that we may make a copy of it.  If your insurance requires a referral to see a specialist, please bring your referral form from your primary care physician.  No co-pay is required for this nurse visit.     If you cannot keep your appointment, please call [phone] to cancel or reschedule prior to your  appointment date.  This allows [place] the opportunity to schedule an appointment for another patient in need of care.    Thank you for choosing [facility] for your medical needs.  We appreciate the opportunity to care for you.  Please visit [place] at our website  to learn more about our practice.  Sincerely, The Gastroenterology Division

## 2010-06-16 NOTE — Miscellaneous (Signed)
Summary: [facility] Pervisit/prep  Clinical Lists Changes  Medications: Added new medication of DULCOLAX 5 MG  TBEC (BISACODYL) Day before procedure take 2 at 3pm and 2 at 8pm. - Signed Added new medication of METOCLOPRAMIDE HCL 10 MG  TABS (METOCLOPRAMIDE HCL) As per prep instructions. - Signed Added new medication of MIRALAX   POWD (POLYETHYLENE GLYCOL 3350) As per prep  instructions. - Signed Rx of DULCOLAX 5 MG  TBEC (BISACODYL) Day before procedure take 2 at 3pm and 2 at 8pm.;  #4 x 0;  Signed;  Entered by: Wyona Almas RN;  Authorized by: Hart Carwin MD;  Method used: Print then Give to Patient Rx of METOCLOPRAMIDE HCL 10 MG  TABS (METOCLOPRAMIDE HCL) As per prep instructions.;  #2 x 0;  Signed;  Entered by: Wyona Almas RN;  Authorized by: Hart Carwin MD;  Method used: Print then Give to Patient Rx of MIRALAX   POWD (POLYETHYLENE GLYCOL 3350) As per prep  instructions.;  #255gm x 0;  Signed;  Entered by: Wyona Almas RN;  Authorized by: Hart Carwin MD;  Method used: Print then Give to Patient Observations: Added new observation of NKA: T (04/17/2010 10:59)    Prescriptions: MIRALAX   POWD (POLYETHYLENE GLYCOL 3350) As per prep  instructions.  #255gm x 0   Entered by:   Wyona Almas RN   Authorized by:   Hart Carwin MD   Signed by:   Wyona Almas RN on 04/17/2010   Method used:   Print then Give to Patient   RxID:   [id number] METOCLOPRAMIDE HCL 10 MG  TABS (METOCLOPRAMIDE HCL) As per prep instructions.  #2 x 0   Entered by:   Wyona Almas RN   Authorized by:   Hart Carwin MD   Signed by:   Wyona Almas RN on 04/17/2010   Method used:   Print then Give to Patient   RxID:   [id number] DULCOLAX 5 MG  TBEC (BISACODYL) Day before procedure take 2 at 3pm and 2 at 8pm.  #4 x 0   Entered by:   Wyona Almas RN   Authorized by:   Hart Carwin MD   Signed by:   Wyona Almas RN on 04/17/2010   Method used:   Print then Give to Patient   RxID:    [id number]

## 2010-06-18 NOTE — Procedures (Signed)
Summary: Colonoscopy  Patient: Terry Parks Note: All result statuses are Final unless otherwise noted.  Tests: (1) Colonoscopy (COL)   COL Colonoscopy           DONE     [facility]     [address]     [place], [place]  [address]           COLONOSCOPY PROCEDURE REPORT           PATIENT:  Terry Parks, Terry Parks  MR#:  [id number]     BIRTHDATE:  [birth year], 54 yrs. old  GENDER:  male     ENDOSCOPIST:  Hedwig Morton. Juanda Chance, MD     REF. BY:  Ileana Roup, M.D.     PROCEDURE DATE:  05/01/2010     PROCEDURE:  Colonoscopy [address]     ASA CLASS:  Class II     INDICATIONS:  Routine Risk Screening     MEDICATIONS:   Versed 10 mg, Fentanyl 75 mcg           DESCRIPTION OF PROCEDURE:   After the risks benefits and     alternatives of the procedure were thoroughly explained, informed     consent was obtained.  Digital rectal exam was performed and     revealed no rectal masses.   The LB [id number] endoscope     was introduced through the anus and advanced to the cecum, which     was identified by both the appendix and ileocecal valve, without     limitations.  The quality of the prep was excellent, using     MiraLax.  The instrument was then slowly withdrawn as the colon     was fully examined.     <<PROCEDUREIMAGES>>           FINDINGS:  No polyps or cancers were seen (see image1, image2, and     image3).   Retroflexed views in the rectum revealed no     abnormalities.    The scope was then withdrawn from the patient     and the procedure completed.           COMPLICATIONS:  None     ENDOSCOPIC IMPRESSION:     1) No polyps or cancers     2) Normal colonoscopy     RECOMMENDATIONS:     1) high fiber diet     REPEAT EXAM:  In 10 year(s) for.           ______________________________     Hedwig Morton. Juanda Chance, MD           CC:           n.     eSIGNED:   Hedwig Morton. Brodie at 05/01/2010 12:11 PM           [name], [phone]  Note: An exclamation mark (!)  indicates a result that was not dispersed into the flowsheet. Document Creation Date: 05/01/2010 12:11 PM _______________________________________________________________________  (1) Order result status: Final Collection or observation date-time: 05/01/2010 12:07 Requested date-time:  Receipt date-time:  Reported date-time:  Referring Physician:   Ordering Physician: Lina Sar [phone]) Specimen Source:  Source: Launa Grill Order Number: [id number] Lab site:   Appended Document: Colonoscopy    Clinical Lists Changes  Observations: Added new observation of COLONNXTDUE: 05/01/2020 (05/01/2010 13:37)

## 2010-06-18 NOTE — Discharge Summary (Signed)
Summary: Hospital Discharge Update    Hospital Discharge Update:  Date of Admission: 06/05/2010 Date of Discharge: 06/08/2010  Brief Summary:  Patient admitted with renal failure likely 2/2 obstructive uropathy. He had left sided 1-2 mm stone at the ureter and bladder junction along with significant BPH. Cystoscopy was performed, but stone likely had passed. Creatinine had improved at the time of discharge. His pain was well controlled at the time of discharge.  Patient blood pressure was well controlled with only atenolol. I have changed ACE inhibitor dose to minimum to preclude hypotension and worsening renal failure. Flomax will also help lower the BP.  Post-op and after foley removal patient did have hematuria and cramping pain. Patient was given pyridium and low dose benzodiazipine to help with bladder spasm.  Elevated urine WBC were though secondary to instrumentation. Please check UA on follow up and if WBC persists, obtain urine culture and treat for UTI.  Labs needed at follow-up: Basic metabolic panel  Other labs needed at follow-up: UA  Problem list changes:  Added new problem of BENIGN PROSTATIC HYPERTROPHY, WITH OBSTRUCTION (ICD-600.01)  Medication list changes:  Removed medication of AMLODIPINE BESYLATE 10 MG TABS (AMLODIPINE BESYLATE) Take 1 tablet by mouth once a day Removed medication of IBUPROFEN 400 MG TABS (IBUPROFEN) Take 1-2 tablets every 6-8 hours as needed for pain Changed medication from ACCUPRIL 20 MG TABS (QUINAPRIL HCL) Take 1 and 1/2 tablets daily. to ACCUPRIL 10 MG TABS (QUINAPRIL HCL) Take 1 tablet by mouth once a day - Signed Added new medication of FLOMAX 0.4 MG CAPS (TAMSULOSIN HCL) Take two tablets once daily - Signed Added new medication of PYRIDIUM 200 MG TABS (PHENAZOPYRIDINE HCL) three times a day as needed - Signed Added new medication of ATIVAN 0.5 MG TABS (LORAZEPAM) two times a day as needed for bladders spasms - Signed Rx of ACCUPRIL 10 MG  TABS (QUINAPRIL HCL) Take 1 tablet by mouth once a day;  #30 x 3;  Signed;  Entered by: Clerance Lav MD;  Authorized by: Clerance Lav MD;  Method used: Faxed to [facility], [address] [place], [place], [place]  [address], Ph: [phone], Fax: [phone] Rx of FLOMAX 0.4 MG CAPS (TAMSULOSIN HCL) Take two tablets once daily;  #60 x 3;  Signed;  Entered by: Clerance Lav MD;  Authorized by: Clerance Lav MD;  Method used: Print then Give to Patient Rx of PYRIDIUM 200 MG TABS (PHENAZOPYRIDINE HCL) three times a day as needed;  #15 x 0;  Signed;  Entered by: Clerance Lav MD;  Authorized by: Clerance Lav MD;  Method used: Print then Give to Patient Rx of ATIVAN 0.5 MG TABS (LORAZEPAM) two times a day as needed for bladders spasms;  #10 x 0;  Signed;  Entered by: Clerance Lav MD;  Authorized by: Clerance Lav MD;  Method used: Print then Give to Patient  Discharge medications:  ATENOLOL 50 MG TABS (ATENOLOL) Take 1 tablet by mouth once a day ACCUPRIL 10 MG TABS (QUINAPRIL HCL) Take 1 tablet by mouth once a day METFORMIN HCL 1000 MG TABS (METFORMIN HCL) Take 1 tablet by mouth two times a day LANTUS SOLOSTAR 100 UNIT/ML SOLN (INSULIN GLARGINE) Take 12 units of Insulin subcutaneously at bed time     TRUETRACK TEST  STRP (GLUCOSE BLOOD) use to test blood sugar three times a day before meals     LANCETS  MISC (LANCETS) use to test blood sugar three times a day before meals OMEPRAZOLE 20 MG  CPDR (OMEPRAZOLE) Take 1 capsule by mouth once a day PRAVASTATIN SODIUM 40 MG TABS (PRAVASTATIN SODIUM) Take 2  tablets by mouth once a day PERCOCET 5-325 MG TABS (OXYCODONE-ACETAMINOPHEN) Take 1-2 tablets every 4 hours as needed for pain FLOMAX 0.4 MG CAPS (TAMSULOSIN HCL) Take two tablets once daily PYRIDIUM 200 MG TABS (PHENAZOPYRIDINE HCL) three times a day as needed ATIVAN 0.5 MG TABS (LORAZEPAM) two times a day as needed for bladders spasms  Other patient instructions:  You will follow up  with Dr. Meredith Pel on Jan 25th at 11 AM. Please call the clinic or come to Emergency room if you have decrease durination, increased pain. You blood pressure medication dose has changed.   Note: Hospital Discharge Medications & Other Instructions handout was printed, one copy for patient and a second copy to be placed in hospital chart.   Appended Document: Hospital Discharge Update print another copy for inpatient 3 day supply from case manager   Clinical Lists Changes  Medications: Rx of ATIVAN 0.5 MG TABS (LORAZEPAM) two times a day as needed for bladders spasms;  #10 x 0;  Signed;  Entered by: Clerance Lav MD;  Authorized by: Clerance Lav MD;  Method used: Print then Give to Patient Rx of PYRIDIUM 200 MG TABS (PHENAZOPYRIDINE HCL) three times a day as needed;  #15 x 0;  Signed;  Entered by: Clerance Lav MD;  Authorized by: Clerance Lav MD;  Method used: Print then Give to Patient Rx of FLOMAX 0.4 MG CAPS (TAMSULOSIN HCL) Take two tablets once daily;  #60 x 3;  Signed;  Entered by: Clerance Lav MD;  Authorized by: Clerance Lav MD;  Method used: Print then Give to Patient    Prescriptions: FLOMAX 0.4 MG CAPS (TAMSULOSIN HCL) Take two tablets once daily  #60 x 3   Entered and Authorized by:   Clerance Lav MD   Signed by:   Clerance Lav MD on 06/09/2010   Method used:   Print then Give to Patient   RxID:   [id number] PYRIDIUM 200 MG TABS (PHENAZOPYRIDINE HCL) three times a day as needed  #15 x 0   Entered and Authorized by:   Clerance Lav MD   Signed by:   Clerance Lav MD on 06/09/2010   Method used:   Print then Give to Patient   RxID:   [id number] ATIVAN 0.5 MG TABS (LORAZEPAM) two times a day as needed for bladders spasms  #10 x 0   Entered and Authorized by:   Clerance Lav MD   Signed by:   Clerance Lav MD on 06/09/2010   Method used:   Print then Give to Patient   RxID:   [id number]

## 2010-06-18 NOTE — Progress Notes (Signed)
Summary: walkin/ hla  Phone Note Other Incoming   Summary of Call: pt presents c/o "kidney stones", severe pain, N&V, pain L flank, back and abd, 10/10 pain scale. moaning. states urologist would not see him due to financial factors. he presents in clinic distressed and requesting help, spoke w/ dr Reche Dixon and dr Odis Luster, will add to schedule.  Initial call taken by: Marin Roberts RN,  June 02, 2010 11:07 AM

## 2010-06-18 NOTE — Consult Note (Signed)
Summary: Terry Parks  Terry Parks   Imported By: Shon Hough 05/05/2010 15:29:59  _____________________________________________________________________  External Attachment:    Type:   Image     Comment:   External Document  Appended Document: Liliane Bade   Diabetic Eye Exam  Procedure date:  04/01/2010  Findings:      Mild non-proliferative diabetic retinopathy OU. Exam by Liliane Bade   Procedures Next Due Date:    Diabetic Eye Exam: 04/2011

## 2010-06-18 NOTE — Assessment & Plan Note (Addendum)
Summary: kidney stones/pcp-joines/hla   Vital Signs:  Patient profile:   55 year old male Height:      71 inches (180.34 cm) Weight:      306.9 pounds (139.50 kg) BMI:     42.96 Temp:     97.6 degrees F (36.44 degrees C) oral Pulse rate:   78 / minute BP sitting:   149 / 94  (left arm)  Vitals Entered By: Stanton Kidney Ditzler RN (June 02, 2010 11:28 AM) Is Patient Diabetic? Yes Did you bring your meter with you today? No Pain Assessment Patient in pain? yes     Location: left side of body Intensity: 9 Type: sharp Onset of pain  since 05/29/10 - kidney stone Nutritional Status BMI of > 30 = obese Nutritional Status Detail appetite down  Have you ever been in a relationship where you felt threatened, hurt or afraid?denies   Does patient need assistance? Functional Status Self care Ambulation Normal Comments FU ER kidney stone - has [employer]. CBG not done due to kidney stone pain - Dr Odis Luster aware.   Primary Care Provider:  Margarito Liner MD   History of Present Illness: 55yo M with DM (HbA1C 6.7), HTN presents for follow-up of kidney stone. Presented to the ED on 1/13 with severe L flank pain. CT of abdomen demonstrated a 1-2 mm obstructing calculus at the left ureterovesical junction, causing marked left hydroureteronephrosis, marked left renal edema, and marked left perinephric edema. He was discharged from the ED with a small prescription (#20) of Percocet and Flomax with urology follow-up scheduled for today. However, urology would not see him because of his lack of insurance. He continues to have terrible pain and occassional N/V; he tried to save some Percocet tablets but he ended up taking them all because of the severe pain. He is drinking fluids as instructed and urinating well with no blood in urine. He has not eaten much because of the pain. He is tearful and wants to know why he is still having so much pain.   Depression History:      The patient denies a depressed  mood most of the day and a diminished interest in his usual daily activities.         Preventive Screening-Counseling & Management  Alcohol-Tobacco     Smoking Status: never  Caffeine-Diet-Exercise     Caffeine use/day: 0     Does Patient Exercise: yes     Type of exercise: walking  Current Medications (verified): 1)  Atenolol 50 Mg Tabs (Atenolol) .... Take 1 Tablet By Mouth Once A Day 2)  Accupril 20 Mg Tabs (Quinapril Hcl) .... Take 1 and 1/2 Tablets Daily. 3)  Amlodipine Besylate 10 Mg Tabs (Amlodipine Besylate) .... Take 1 Tablet By Mouth Once A Day 4)  Metformin Hcl 1000 Mg Tabs (Metformin Hcl) .... Take 1 Tablet By Mouth Two Times A Day 5)  Lantus Solostar 100 Unit/ml Soln (Insulin Glargine) .... Take 12 Units of Insulin Subcutaneously At Bed Time 6)  Truetrack Test  Strp (Glucose Blood) .... Use To Test Blood Sugar Three Times A Day Before Meals 7)  Lancets  Misc (Lancets) .... Use To Test Blood Sugar Three Times A Day Before Meals 8)  Omeprazole 20 Mg Cpdr (Omeprazole) .... Take 1 Capsule By Mouth Once A Day 9)  Pravastatin Sodium 40 Mg Tabs (Pravastatin Sodium) .... Take 2  Tablets By Mouth Once A Day 10)  Percocet 5-325 Mg Tabs (Oxycodone-Acetaminophen) .... Take 1-2 Tablets  Every 4 Hours As Needed For Pain 11)  Ibuprofen 400 Mg Tabs (Ibuprofen) .... Take 1-2 Tablets Every 6-8 Hours As Needed For Pain  Allergies (verified): No Known Drug Allergies  Past History:  Past Medical History: Last updated: 04/15/2010 Hyperlipidemia Hypertension Cardiomegaly Pleuritis Hyperglycemia Dermatitis Neck pain Diabetes Mellitus Type 2 Allergic rhinitis History of groin abscess History of abscessed tooth Obstructive sleep apnea Headache Dyspepsia  Family History: Last updated: 06/02/2007 Breast cancer in mother. No colon cancer or prostate cancer.  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   Review of Systems      See HPI General:   Complains of fatigue and malaise; denies chills and fever. CV:  Denies chest pain or discomfort. Resp:  Denies shortness of breath. GI:  Complains of abdominal pain, nausea, and vomiting; denies change in bowel habits and vomiting blood. GU:  Denies dysuria and hematuria.  Physical Exam  General:  alert and overweight-appearing. lying on exam table in obvious discomfort. Head:  normocephalic and atraumatic.   Eyes:  vision grossly intact, pupils equal, pupils round, and pupils reactive to light.   Mouth:  pharynx pink and moist.   Lungs:  normal breath sounds, no crackles, and no wheezes.   Heart:  normal rate, regular rhythm, no murmur, no gallop, and no rub.   Abdomen:  soft and no distention. pain with CVA percussion on L.  Neurologic:  alert & oriented X3.   Psych:  Oriented X3, memory intact for recent and remote, tearful, and moderately anxious.     Impression & Recommendations:  Problem # 1:  RENAL CALCULUS (ICD-592.0) Assessment New Patient continues to have considerable amount of pain secondary to renal calculus. He had run out of Percocet when seen in our clinic and was refused urology appt b/c of lack of insurance. Administered Toradol 60mg  IM, after which his pain improved and he was no longer tearful and less anxious. At this point, I think that his main issue is pain control. He is urinating well with no hematuria. I do not believe that subjecting him to more imaging is necessary at this point. Will try to get him in to see a urologist at [facility] ASAP. In the meantime, will discharge him home with Percocet and ibuprofen. Patient advised to drink plenty of fluids. Will call him in the morning to see how he is doing. If he fails to improve or gets worse, will ask him to come back in to the clinic and may consider further imaging at that time.   Orders: Admin of Therapeutic Inj  intramuscular or subcutaneous (47829) Ketorolac-Toradol 15mg  (F6213) Urology Referral  (Urology)  Complete Medication List: 1)  Atenolol 50 Mg Tabs (Atenolol) .... Take 1 tablet by mouth once a day 2)  Accupril 20 Mg Tabs (Quinapril hcl) .... Take 1 and 1/2 tablets daily. 3)  Amlodipine Besylate 10 Mg Tabs (Amlodipine besylate) .... Take 1 tablet by mouth once a day 4)  Metformin Hcl 1000 Mg Tabs (Metformin hcl) .... Take 1 tablet by mouth two times a day 5)  Lantus Solostar 100 Unit/ml Soln (Insulin glargine) .... Take 12 units of insulin subcutaneously at bed time 6)  Truetrack Test Strp (Glucose blood) .... Use to test blood sugar three times a day before meals 7)  Lancets Misc (Lancets) .... Use to test blood sugar three times a day before meals 8)  Omeprazole 20 Mg Cpdr (Omeprazole) .... Take 1 capsule by mouth once a day 9)  Pravastatin Sodium 40 Mg Tabs (Pravastatin sodium) .... Take 2  tablets by mouth once a day 10)  Percocet 5-325 Mg Tabs (Oxycodone-acetaminophen) .... Take 1-2 tablets every 4 hours as needed for pain 11)  Ibuprofen 400 Mg Tabs (Ibuprofen) .... Take 1-2 tablets every 6-8 hours as needed for pain  Patient Instructions: 1)  Drink plenty of fluids. 2)  Take Percocet and ibuprofen for pain.  3)  I will call you tomorrow to check on you. If you are feeling worse or not getting better, we may have you come back to the clinic to be seen.  Prescriptions: IBUPROFEN 400 MG TABS (IBUPROFEN) Take 1-2 tablets every 6-8 hours as needed for pain  #90 x 0   Entered and Authorized by:   Whitney Post MD   Signed by:   Whitney Post MD on 06/02/2010   Method used:   Print then Give to Patient   RxID:   [id number] PERCOCET 5-325 MG TABS (OXYCODONE-ACETAMINOPHEN) Take 1-2 tablets every 4 hours as needed for pain  #90 x 0   Entered and Authorized by:   Whitney Post MD   Signed by:   Whitney Post MD on 06/02/2010   Method used:   Print then Give to Patient   RxID:   [id number]    Medication Administration  Injection # 1:    Medication:  Ketorolac-Toradol 15mg     Diagnosis: RENAL CALCULUS (ICD-592.0)    Route: IM    Site: L deltoid    Exp Date: 03/18/2011    Lot #: [id number]    Mfr: [email or web address]    Comments: Toradol 60mg  im per Dr Derryl Harbor IM at 11:25AM.    Patient tolerated injection without complications    Given by: Stanton Kidney Ditzler RN (June 02, 2010 11:27 AM)  Orders Added: 1)  Admin of Therapeutic Inj  intramuscular or subcutaneous [96372] 2)  Ketorolac-Toradol 15mg  [J1885] 3)  Urology Referral [Urology]      Medication Administration  Injection # 1:    Medication: Ketorolac-Toradol 15mg     Diagnosis: RENAL CALCULUS (ICD-592.0)    Route: IM    Site: L deltoid    Exp Date: 03/18/2011    Lot #: [id number]    Mfr: [email or web address]    Comments: Toradol 60mg  im per Dr Derryl Harbor IM at 11:25AM.    Patient tolerated injection without complications    Given by: Stanton Kidney Ditzler RN (June 02, 2010 11:27 AM)  Orders Added: 1)  Admin of Therapeutic Inj  intramuscular or subcutaneous [96372] 2)  Ketorolac-Toradol 15mg  [[id number]] 3)  Urology Referral [Urology]  Prevention & Chronic Care Immunizations   Influenza vaccine: Fluvax Non-MCR  (01/15/2010)   Influenza vaccine deferral: Deferred  (01/02/2010)    Tetanus booster: 01/31/2008: Tdap    Pneumococcal vaccine: Pneumovax  (04/15/2010)   Pneumococcal vaccine deferral: Refused  (07/24/2009)  Colorectal Screening   Hemoccult: negative x 3  (04/08/2008)   Hemoccult action/deferral: Deferred  (01/02/2010)   Hemoccult due: 04/2009    Colonoscopy: DONE  (05/01/2010)   Colonoscopy action/deferral: GI referral  (01/15/2010)   Colonoscopy due: 05/01/2020  Other Screening   PSA: 3.13  (04/15/2010)   PSA action/deferral: Discussed-PSA requested  (04/15/2010)   Smoking status: never  (06/02/2010)  Diabetes Mellitus   HgbA1C: 6.7  (04/15/2010)    Eye exam: Mild non-proliferative diabetic retinopathy OU. Exam by Liliane Bade   (04/01/2010)   Diabetic eye exam  action/deferral: Ophthalmology referral  (01/15/2010)   Eye exam due: 04/2011    Foot  exam: yes  (04/15/2010)   Foot exam action/deferral: Do today   High risk foot: No  (04/15/2010)   Foot care education: Done  (04/15/2010)   Foot exam due: 04/16/2011    Urine microalbumin/creatinine ratio: 14.8  (01/15/2010)   Urine microalbumin action/deferral: Ordered    Diabetes flowsheet reviewed?: Yes   Progress toward A1C goal: Unchanged  Lipids   Total Cholesterol: 166  (01/15/2010)   LDL: 108  (01/15/2010)   LDL Direct: Not documented   HDL: 32  (01/15/2010)   Triglycerides: 132  (01/15/2010)    SGOT (AST): 29  (04/15/2010)   SGPT (ALT): 41  (04/15/2010)   Alkaline phosphatase: 58  (04/15/2010)   Total bilirubin: 0.4  (04/15/2010)    Lipid flowsheet reviewed?: Yes   Progress toward LDL goal: Unchanged  Hypertension   Last Blood Pressure: 149 / 94  (06/02/2010)   Serum creatinine: 1.19  (04/15/2010)   Serum potassium 4.6  (04/15/2010)    Hypertension flowsheet reviewed?: Yes   Progress toward BP goal: Unchanged  Self-Management Support :   Personal Goals (by the next clinic visit) :     Personal A1C goal: 7  (07/24/2009)     Personal blood pressure goal: 130/80  (07/24/2009)     Personal LDL goal: 100  (07/24/2009)    Patient will work on the following items until the next clinic visit to reach self-care goals:     Medications and monitoring: take my medicines every day, check my blood sugar, bring all of my medications to every visit, examine my feet every day  (06/02/2010)     Eating: eat more vegetables, use fresh or frozen vegetables, eat foods that are low in salt, eat baked foods instead of fried foods, eat fruit for snacks and desserts, limit or avoid alcohol  (06/02/2010)     Activity: take a 30 minute walk every day, take the stairs instead of the elevator, park at the far end of the parking lot  (06/02/2010)    Diabetes self-management support: Written self-care  plan, Education handout, Resources for patients handout  (06/02/2010)   Diabetes care plan printed   Diabetes education handout printed   Last diabetes self-management training by diabetes educator: 04/02/2009   Last medical nutrition therapy: 05/06/2008    Hypertension self-management support: Written self-care plan, Education handout, Resources for patients handout  (06/02/2010)   Hypertension self-care plan printed.   Hypertension education handout printed    Lipid self-management support: Written self-care plan, Education handout, Resources for patients handout  (06/02/2010)   Lipid self-care plan printed.   Lipid education handout printed      Resource handout printed.    Last LDL:                                                 108 (01/15/2010 6:09:00 PM)      Diabetic Foot Exam Comments: Dr Odis Luster suggested not to do foot exam today duem to pain from kidney stone.  Debra Ditzler RN   Appended Document: kidney stones/pcp-joines/hla Called patient the day following appointment (1/18) and he reported feeling much better after receiving the toradol injection in clinic. He had not yet needed any of the percocet prescribed because his pain was minimal. He reported eating and drinking well with good urination, no hematuria. Has been straining urine but has not found  any stones.  Patient called and left a message 1/20 stating that he had had more pain but that it was adequately controlled with percocet.

## 2010-06-18 NOTE — Progress Notes (Signed)
Summary: prep concern  Phone Note Call from Patient Call back at Home Phone [phone]   Caller: Patient Call For: Dr. Juanda Chance Reason for Call: Talk to Nurse Summary of Call: prep not at pharmacy... [facility] Initial call taken by: Vallarie Mare,  April 29, 2010 3:42 PM  Follow-up for Phone Call        Spoke with pharmacist at [facility].  She had Miralax and Reglan in stock.  Rx's given to her with instructions to have pt. pick up dulcolax at his pharmacy. Follow-up by: Wyona Almas RN,  April 29, 2010 3:53 PM

## 2010-06-25 NOTE — Discharge Summary (Signed)
NAMEKIT, MOLLETT NO.:  [id number]  MEDICAL RECORD NO.:  [id number]          PATIENT TYPE:  INP  LOCATION:  6739                         FACILITY:  [facility]  PHYSICIAN:  Mariea Stable, MD   DATE OF BIRTH:  01-Mar-1956  DATE OF ADMISSION:  06/05/2010 DATE OF DISCHARGE:  06/09/2010                              DISCHARGE SUMMARY   DISCHARGE DIAGNOSES: 1. Symptomatic nephrolithiasis, increase in creatinine secondary to     obstruction. 2. Hematuria. 3. Benign prostatic hypertrophy. 4. Hypertension. 5. Diabetes mellitus.  DISCHARGE MEDICATIONS: 1. Atenolol 50 mg once daily. 2. Accupril 10 mg once daily. 3. Metformin 1000 mg b.i.d. 4. Lantus 100 units at bedtime. 5. Omeprazole 20 mg daily. 6. Pravastatin 40 mg daily. 7. Percocet 5/325 one to two tablets every 4 hours p.r.n. pain. 8. Flomax 0.8 mg daily. 9. Pyridium 200 mg t.i.d. 10.Ativan 0.5 mg b.i.d. as needed for bladder spasm.  DISPOSITION AND FOLLOWUP:  Follow up appointment with Dr. Meredith Pel at 11 a.m. on June 10, 2010, please assess the patient compliance to medication.  Please assess that hematuria has been resolved, and the patient is able to void without issue.  The patient will also follow up with Urology.  PROCEDURES PERFORMED:  CT of abdomen and pelvis on June 07, 2010, cystoscopy with attempt of retrograde pyelogram and ureteroscopy.  CONSULTATIONS:  Urology.  HISTORY AND PHYSICAL:  The patient is a 55 year old male with past medical history significant diabetes and hypertension presents with the chief complaint of flank pain of 1-week duration.  The patient states that the flank pain started on the left side and only migrated towards the midline and initially was 7/10 and progressively 10/10.  The patient described the pain as constant and sharp with frequently having to move around and adjust for comfort.  The patient came to the ED 1 week prior and was diagnosed with 2 mL renal  calculi and the left collecting system.  He was started at home with pain medication; however, the pain did not resolve.  At that time, he was also noted to have hydroureter and hydronephrosis.  The patient denied any nausea or vomiting or lower urinary tract symptoms, hematuria, or dysuria.  He does state an increase in urinary frequency.  The patient has no prior history of film.  PHYSICAL EXAMINATION:  VITAL SIGNS:  Temperature  98.6, blood pressure 142/84, pulse 72, respiratory rate 18, satting 99% on room air. GENERAL:  The patient was in no acute distress; however, was moving around frequently in bed.  His mucous membranes were dry. RESPIRATORY:  Clear to auscultation bilaterally.  No wheezes or crackles. CARDIOVASCULAR:  Regular rate and rhythm.  No murmurs, rubs, or gallops. GI:  Positive bowel sounds.  Obese abdomen, nontender, nondistended as per patient. EXTREMITIES:  Some trace edema. GU:  No CVA tenderness. SKIN:  Normal turgor. MUSCULOSKELETAL:  Normal range of motion.  No joint swelling.NEURO:  Alert and oriented x4. PSYCH:  Appropriate.  The patient did not seem anxious or depressed.  LABORATORY DATA:  CMET:  Sodium 136, potassium 4.6, chloride 103, bicarb 25, BUN 12, creatinine 1.68, glucose 128, calcium is  9.1.  CBC:  WBC 8.4, hemoglobin 13.2, hematocrit 41.4, platelets 190, ALT 5.7.  UA was negative with the pH of 7.5.  HOSPITAL COURSE BY PROBLEM: 1. Symptomatic nephrolithiasis.  The patient was admitted and treated     symptomatically for pain.  The patient was also given fluids and     Flomax with the hope of helping pass the stone.  Urology was     consulted, the patient was taken to cystoscopy and we were unable     to visualize the ureteric opening in the bladder and unable to pass the stent, and there     was some bleeding during the procedure. It was thought that the patient might have passed the stone. The patient's hematuria and flank pain improved  following the procedure and completely resolved prior to the discharge. 2. Elevated creatinine.  The patient's creatinine was elevated due to     obstruction from stone and inflammation at the urethral orifice.     Immediately after cystoscopy, the patient's creatinine began to     trend down and the patient was at his baseline at the time of     discharge. 3. Hematuria.  Resolved at the time of discharge and was secondary to     instrument from cystoscopy. 4. BPH. severe BPH per cystoscopy report and CT scan findings. The patient sent home with the prescription for     Flomax to take daily. 5. Hypertension.  Remained well controlled while in hospital. 6. Diabetes mellitus, well controlled while in hospital.  DISCHARGE LABS AND VITALS:  Discharge Labs:  BMET:  Sodium 140, potassium 4.2, chloride 106, CO2 of 26, BUN 7, creatinine 1.17, glucose 141, calcium 8.7, uric acid 57.4.  Hemoglobin A1c 7, cholesterol 130, triglycerides 75, HDL 29, LDL 86.     Clerance Lav, MD PhD   ______________________________ Mariea Stable, MD    RS/MEDQ  D:  06/11/2010  T:  06/12/2010  Job:  [id number]  cc:   Ileana Roup, M.D.  Electronically Signed by Clerance Lav MD PHD on 06/23/2010 04:44:57 PM Electronically Signed by Mariea Stable MD on 06/25/2010 06:13:26 AM

## 2010-07-03 ENCOUNTER — Encounter: Payer: Self-pay | Admitting: Internal Medicine

## 2010-07-15 ENCOUNTER — Encounter: Payer: Self-pay | Admitting: Internal Medicine

## 2010-07-15 ENCOUNTER — Ambulatory Visit (INDEPENDENT_AMBULATORY_CARE_PROVIDER_SITE_OTHER): Payer: Self-pay | Admitting: Internal Medicine

## 2010-07-15 VITALS — BP 174/110 | HR 83 | Temp 97.0°F | Ht 71.0 in | Wt 307.2 lb

## 2010-07-15 DIAGNOSIS — N401 Enlarged prostate with lower urinary tract symptoms: Secondary | ICD-10-CM

## 2010-07-15 DIAGNOSIS — N200 Calculus of kidney: Secondary | ICD-10-CM

## 2010-07-15 DIAGNOSIS — I10 Essential (primary) hypertension: Secondary | ICD-10-CM

## 2010-07-15 DIAGNOSIS — G4733 Obstructive sleep apnea (adult) (pediatric): Secondary | ICD-10-CM

## 2010-07-15 DIAGNOSIS — E785 Hyperlipidemia, unspecified: Secondary | ICD-10-CM

## 2010-07-15 DIAGNOSIS — E119 Type 2 diabetes mellitus without complications: Secondary | ICD-10-CM

## 2010-07-15 LAB — CONVERTED CEMR LAB
Albumin: 4 g/dL (ref 3.5–5.2)
Alkaline Phosphatase: 64 units/L (ref 39–117)
Bilirubin Urine: NEGATIVE
CO2: 29 meq/L (ref 19–32)
Glucose, Bld: 147 mg/dL — ABNORMAL HIGH (ref 70–99)
Potassium: 4.3 meq/L (ref 3.5–5.3)
Protein, ur: NEGATIVE mg/dL
Sodium: 139 meq/L (ref 135–145)
Total Protein: 7.2 g/dL (ref 6.0–8.3)
Urobilinogen, UA: 0.2 (ref 0.0–1.0)

## 2010-07-15 LAB — COMPREHENSIVE METABOLIC PANEL
Alkaline Phosphatase: 64 U/L (ref 39–117)
BUN: 9 mg/dL (ref 6–23)
Glucose, Bld: 147 mg/dL — ABNORMAL HIGH (ref 70–99)
Sodium: 139 mEq/L (ref 135–145)
Total Bilirubin: 0.4 mg/dL (ref 0.3–1.2)

## 2010-07-15 NOTE — Assessment & Plan Note (Signed)
Patient had apparently passed his left ureteral calculus by the time of his discharge from the hospital in January. He has done well since his discharge, without any symptoms. The plan is to check a urinalysis and comprehensive metabolic panel today , and I advised him to follow up with his urologist Dr. Isabel Caprice.

## 2010-07-15 NOTE — Assessment & Plan Note (Signed)
Continue CPAP.  

## 2010-07-15 NOTE — Assessment & Plan Note (Signed)
Assessment: Diabetes control: controlled Progress toward goals: at goal Barriers to meeting goals: no barriers identified  Plan: Diabetes treatment: continue current medications Refer to: none Instruction/counseling given: reminded to bring medications to each visit  Lab Results  Component Value Date   HGBA1C  6.6 07/15/2010   HGBA1C  7.0  06/05/2010   CREATININE  1.17 06/09/2010   MICROALBUR  3.71* 01/15/2010   MICRALBCREAT  14.8 01/15/2010   CHOL  121                06/06/2010   LDL  80         06/06/2010   HDL 24* 06/06/2010   TRIG 83 06/06/2010    Last eye exam and foot exam:    Component Value Date/Time   HMDIABEYEEXA Mild non-proliferative diabetic retinopathy OU.  Exam by Liliane Bade. 04/15/2010   HMDIABFOOTEX Done. 04/15/2010

## 2010-07-15 NOTE — Assessment & Plan Note (Signed)
Assessment: Hypertension control:  moderately elevated (due to not taking antihypertensive medications)  Progress toward goals:  deteriorated Barriers to meeting goals:  nonadherence to medications  Plan: Hypertension treatment:  I emphasized to patient the absolute importance of taking his medication as prescribed, and the risks of uncontrolled hypertension.   I advised him to have his medications refilled today and start taking them today. I advised him to call or return if he develops any symptoms such as headache, chest pain, shortness of breath, or other problems. I advised him to return next week for recheck of his blood pressure.  Lab Results  Component Value Date   NA 140 06/09/2010   K 4.2 06/09/2010   CL 106 06/09/2010   CO2 26 06/09/2010   BUN 7 06/09/2010   CREATININE 1.17 06/09/2010    BP Readings from Last 3 Encounters:  07/15/10 174/110  06/02/10 149/94  04/15/10 162/107

## 2010-07-15 NOTE — Assessment & Plan Note (Signed)
Patient denies urinary hesitancy or other symptoms. The plan is to continue Flomax, which was started during his hospitalization in January. I advised him to follow up as above with his urologist Dr. Isabel Caprice.

## 2010-07-15 NOTE — Patient Instructions (Signed)
IMPORTANT: Please have your blood pressure medications refilled today and start taking them. Call or return if you develop headache, chest pain, shortness of breath, or other problems.

## 2010-07-15 NOTE — Assessment & Plan Note (Signed)
Assessment: Hyperlipidemia control: controlled Progress toward goals: at goal Barriers to meeting goals: no barriers identified  Plan: Hyperlipidemia treatment: continue current medications  Lab Results  Component Value Date   CHOL  121               06/06/2010   LDL  80         06/06/2010   TRIG  83 06/06/2010

## 2010-07-15 NOTE — Progress Notes (Signed)
  Subjective:    Patient ID: Terry Parks, male    DOB: 21-Mar-1956, 55 y.o.   MRN: [id number]  HPI Patient returns for follow up of a hospitalization from January 20 through June 08, 2010 with an obstructing ureteral calculus, and for follow-up of his chronic medical problems including hypertension, diabetes, hyperlipidemia, and obstructive sleep apnea.  At the time of his discharge from the hospital, a repeat CT scan had shown apparent spontaneous passage of the ureteral calculus.  He reports that he has done well since his discharge home.  He has had no flank pain or abdominal pain, and is no longer taking the Percocet that was prescribed at discharge for pain or the Ativan that was prescribed for relief of bladder spasm. He reports that he is feeling well with no acute problems.  He says that he has been out of his blood pressure medicine for 2 weeks, although it is not clear why he did not have them refilled; he plans to pick them up today.  Review of Systems  Constitutional: Negative for fever, chills, diaphoresis and appetite change.  Respiratory: Negative for chest tightness and shortness of breath.   Cardiovascular: Negative for chest pain, palpitations and leg swelling.  Gastrointestinal: Negative for nausea and abdominal pain.  Genitourinary: Negative for dysuria, urgency, frequency, hematuria, flank pain, decreased urine volume, difficulty urinating, penile pain and testicular pain.  Musculoskeletal: Negative for myalgias.    Past medical history, family history, and social history were reviewed with patient and updated.    Objective:   Physical Exam  Constitutional: No distress.  Cardiovascular: Normal rate, regular rhythm and normal heart sounds.  Exam reveals no gallop and no friction rub.   No murmur heard. Pulmonary/Chest: Effort normal and breath sounds normal. He has no wheezes. He has no rales. He exhibits no tenderness ( No CVA tenderness.).  Abdominal: Soft. Bowel  sounds are normal. He exhibits no distension. There is no tenderness. There is no rebound and no guarding.  Musculoskeletal: He exhibits no edema.          Assessment & Plan:

## 2010-07-16 LAB — URINALYSIS
Bilirubin Urine: NEGATIVE
Specific Gravity, Urine: 1.016 (ref 1.005–1.030)
Urine Glucose, Fasting: NEGATIVE mg/dL

## 2010-07-22 ENCOUNTER — Ambulatory Visit: Payer: Self-pay | Admitting: Internal Medicine

## 2010-07-22 ENCOUNTER — Telehealth: Payer: Self-pay

## 2010-07-22 NOTE — Telephone Encounter (Signed)
Terry Parks called in to say he would not be coming in today because he just picked up his blood pressure medications -07-22-10 and that was what the appointment was for.Patient will be rescheduled to see you or another resident in about 2-3 weeks.

## 2010-07-23 NOTE — Telephone Encounter (Signed)
He should reschedule within 2 weeks for follow up of his blood pressure now that he has picked up his medication.

## 2010-07-27 LAB — GLUCOSE, CAPILLARY: Glucose-Capillary: 125 mg/dL — ABNORMAL HIGH (ref 70–99)

## 2010-07-28 LAB — GLUCOSE, CAPILLARY: Glucose-Capillary: 128 mg/dL — ABNORMAL HIGH (ref 70–99)

## 2010-07-31 LAB — GLUCOSE, CAPILLARY: Glucose-Capillary: 490 mg/dL — ABNORMAL HIGH (ref 70–99)

## 2010-08-06 ENCOUNTER — Encounter: Payer: Self-pay | Admitting: Internal Medicine

## 2010-08-06 ENCOUNTER — Ambulatory Visit (INDEPENDENT_AMBULATORY_CARE_PROVIDER_SITE_OTHER): Payer: Self-pay | Admitting: Internal Medicine

## 2010-08-06 VITALS — BP 152/97 | HR 81 | Wt 310.0 lb

## 2010-08-06 DIAGNOSIS — I10 Essential (primary) hypertension: Secondary | ICD-10-CM

## 2010-08-06 MED ORDER — HYDROCHLOROTHIAZIDE 25 MG PO TABS: 12.5000 mg | ORAL_TABLET | Freq: Every day | ORAL | Status: DC

## 2010-08-06 NOTE — Progress Notes (Signed)
  Subjective:    Patient ID: Terry Parks, male    DOB: 1955/09/18, 55 y.o.   MRN: [id number]  HPI Here on follow up to review his blood pressure, while on medications Has considerable financial pressure, since he is partly unemployed-now receiving medication assistance for his insulin, which is a help  Was previously on HCTZ-taken off that while in hospital for nephrolithias assosciated with obstructive nephropathy-never restarted.   Review of Systems No symptoms referrable to nephrolithiasis   Objective:   Physical Exam    See vitals flow sheet    Assessment & Plan:   HTN, not at goal DM-2 Recent Hx nephrolithiasis  Plan:  Restart HCTZ 12.5 mg a day Maintain hydration, both for his DM-2 and history nephrolithiasis FU in 8 weeks to recheck

## 2010-08-20 LAB — GLUCOSE, CAPILLARY: Glucose-Capillary: 239 mg/dL — ABNORMAL HIGH (ref 70–99)

## 2010-09-29 NOTE — Procedures (Signed)
NAMEJERRE, Terry Parks NO.:  [id number]   MEDICAL RECORD NO.:  [id number]          PATIENT TYPE:  OUT   LOCATION:  SLEEP CENTER                 FACILITY:  [facility]   PHYSICIAN:  Clinton D. Maple Hudson, MD, FCCP, FACPDATE OF BIRTH:  August 15, 1955   DATE OF STUDY:  03/06/2008                            NOCTURNAL POLYSOMNOGRAM   REFERRING PHYSICIAN:  Ileana Roup, M.D.   REFERRING PHYSICIAN:  Ileana Roup, MD.   INDICATION FOR STUDY:  Hypersomnia with sleep apnea.   EPWORTH SLEEPINESS SCORE:  Epworth sleepiness score 11/24.  BMI 41.  Weight 310 pounds.  Height 73 inches.  Neck 19 inches.   HOME MEDICATIONS:  Home medications are charted and reviewed.   SLEEP ARCHITECTURE:  Split study protocol.  During the diagnostic phase  total sleep time was 120 minutes with sleep efficiency 54.7%.  Stage I  was 5%.  Stage II 64.6%.  Stage III absent.  REM 30.4% of total sleep  time.  Sleep latency 92 minutes.  REM latency 89 minutes.  Awake after  sleep onset 7 minutes.  Arousal index 45.  No bedtime medication was  taken.   RESPIRATORY DATA:  Split study protocol.  Apnea-hypopnea index (AHI)  108.5 per hour.  A total of 217 events were scored including 97  obstructive apneas, 2 mixed apneas, and 118 hypopneas.  Events were not  positional.  REM AHI 105.  CPAP was titrated to 13 CWP, AHI 0 per hour.  He chose a medium ResMed Mirage Quattro mask with heated humidifier.   OXYGEN DATA:  Moderately loud snoring before CPAP with oxygen  desaturation to a nadir of 75%.  Mean oxygen saturation after CPAP  control was 93.2% on room air.   CARDIAC DATA:  Normal sinus rhythm.   MOVEMENT/PARASOMNIA:  No significant movement disturbance.  Bathroom x2.   IMPRESSIONS/RECOMMENDATIONS:  1. Severe obstructive sleep apnea/hypopnea syndrome, AHI 108.5 per      hour with nonpositional events.  Moderately loud snoring and oxygen      desaturation to a nadir of 75%.  2. Successful CPAP titration  to 13 CWP, AHI 0 per hour.  He chose a      medium ResMed Mirage Quattro full face mask with heated humidifier.      Clinton D. Maple Hudson, MD, [facility], FACP  Diplomate, Biomedical engineer of Sleep Medicine  Electronically Signed     CDY/MEDQ  D:  03/09/2008 12:00:28  T:  03/10/2008 01:25:26  Job:  [id number]

## 2010-09-30 ENCOUNTER — Ambulatory Visit: Payer: Self-pay | Admitting: Internal Medicine

## 2011-01-27 ENCOUNTER — Other Ambulatory Visit: Payer: Self-pay | Admitting: *Deleted

## 2011-01-28 MED ORDER — METFORMIN HCL 1000 MG PO TABS: 1000.0000 mg | ORAL_TABLET | Freq: Two times a day (BID) | ORAL | Status: DC

## 2011-02-16 LAB — GLUCOSE, CAPILLARY: Glucose-Capillary: 131 — ABNORMAL HIGH

## 2011-02-25 LAB — I-STAT 8, (EC8 V) (CONVERTED LAB)
Acid-Base Excess: 2
BUN: 12
Bicarbonate: 25.7 — ABNORMAL HIGH
HCT: 46
Hemoglobin: 15.6
Operator id: 198171
Sodium: 138
TCO2: 27

## 2011-02-25 LAB — POCT I-STAT CREATININE
Creatinine, Ser: 1.2
Operator id: 198171

## 2012-01-04 ENCOUNTER — Telehealth: Payer: Self-pay | Admitting: Dietician

## 2012-01-04 NOTE — Telephone Encounter (Signed)
Per front office: tried contacting by phone. Will mail letter reminding patient to schedule appointment.

## 2012-01-31 ENCOUNTER — Other Ambulatory Visit (HOSPITAL_COMMUNITY): Payer: Self-pay

## 2012-01-31 ENCOUNTER — Observation Stay (HOSPITAL_COMMUNITY): Payer: Self-pay

## 2012-01-31 ENCOUNTER — Encounter (HOSPITAL_COMMUNITY): Payer: Self-pay | Admitting: Emergency Medicine

## 2012-01-31 ENCOUNTER — Emergency Department (HOSPITAL_COMMUNITY): Payer: Self-pay

## 2012-01-31 ENCOUNTER — Observation Stay (HOSPITAL_COMMUNITY)
Admission: EM | Admit: 2012-01-31 | Discharge: 2012-02-02 | Disposition: A | Discharge status: Home / Self Care | Payer: MEDICAID | Attending: Internal Medicine | Admitting: Internal Medicine

## 2012-01-31 DIAGNOSIS — R059 Cough, unspecified: Secondary | ICD-10-CM | POA: Insufficient documentation

## 2012-01-31 DIAGNOSIS — E66813 Obesity, class 3: Secondary | ICD-10-CM | POA: Insufficient documentation

## 2012-01-31 DIAGNOSIS — R739 Hyperglycemia, unspecified: Secondary | ICD-10-CM

## 2012-01-31 DIAGNOSIS — E1159 Type 2 diabetes mellitus with other circulatory complications: Secondary | ICD-10-CM | POA: Diagnosis present

## 2012-01-31 DIAGNOSIS — E785 Hyperlipidemia, unspecified: Secondary | ICD-10-CM | POA: Insufficient documentation

## 2012-01-31 DIAGNOSIS — R0602 Shortness of breath: Secondary | ICD-10-CM

## 2012-01-31 DIAGNOSIS — R079 Chest pain, unspecified: Principal | ICD-10-CM | POA: Insufficient documentation

## 2012-01-31 DIAGNOSIS — J209 Acute bronchitis, unspecified: Secondary | ICD-10-CM | POA: Diagnosis present

## 2012-01-31 DIAGNOSIS — Z91199 Patient's noncompliance with other medical treatment and regimen due to unspecified reason: Secondary | ICD-10-CM | POA: Insufficient documentation

## 2012-01-31 DIAGNOSIS — I517 Cardiomegaly: Secondary | ICD-10-CM

## 2012-01-31 DIAGNOSIS — Z9119 Patient's noncompliance with other medical treatment and regimen: Secondary | ICD-10-CM | POA: Insufficient documentation

## 2012-01-31 DIAGNOSIS — E1142 Type 2 diabetes mellitus with diabetic polyneuropathy: Secondary | ICD-10-CM | POA: Diagnosis present

## 2012-01-31 DIAGNOSIS — R05 Cough: Secondary | ICD-10-CM | POA: Insufficient documentation

## 2012-01-31 DIAGNOSIS — E11319 Type 2 diabetes mellitus with unspecified diabetic retinopathy without macular edema: Secondary | ICD-10-CM

## 2012-01-31 DIAGNOSIS — I119 Hypertensive heart disease without heart failure: Secondary | ICD-10-CM | POA: Insufficient documentation

## 2012-01-31 DIAGNOSIS — E1165 Type 2 diabetes mellitus with hyperglycemia: Secondary | ICD-10-CM | POA: Diagnosis present

## 2012-01-31 DIAGNOSIS — Z9114 Patient's other noncompliance with medication regimen: Secondary | ICD-10-CM

## 2012-01-31 DIAGNOSIS — E119 Type 2 diabetes mellitus without complications: Secondary | ICD-10-CM | POA: Insufficient documentation

## 2012-01-31 DIAGNOSIS — G4733 Obstructive sleep apnea (adult) (pediatric): Secondary | ICD-10-CM | POA: Diagnosis present

## 2012-01-31 DIAGNOSIS — E1169 Type 2 diabetes mellitus with other specified complication: Secondary | ICD-10-CM | POA: Diagnosis present

## 2012-01-31 DIAGNOSIS — I152 Hypertension secondary to endocrine disorders: Secondary | ICD-10-CM | POA: Diagnosis present

## 2012-01-31 LAB — CBC WITH DIFFERENTIAL/PLATELET
Eosinophils Absolute: 0.1 10*3/uL (ref 0.0–0.7)
Eosinophils Relative: 2 % (ref 0–5)
Lymphs Abs: 2.4 10*3/uL (ref 0.7–4.0)
MCH: 27.1 pg (ref 26.0–34.0)
MCHC: 33.9 g/dL (ref 30.0–36.0)
MCV: 79.8 fL (ref 78.0–100.0)
Platelets: 140 10*3/uL — ABNORMAL LOW (ref 150–400)
RBC: 5.21 MIL/uL (ref 4.22–5.81)
RDW: 12.5 % (ref 11.5–15.5)

## 2012-01-31 LAB — RAPID URINE DRUG SCREEN, HOSP PERFORMED
Amphetamines: NOT DETECTED
Barbiturates: NOT DETECTED
Benzodiazepines: NOT DETECTED

## 2012-01-31 LAB — HEPATIC FUNCTION PANEL
ALT: 18 U/L (ref 0–53)
AST: 16 U/L (ref 0–37)
Albumin: 3.3 g/dL — ABNORMAL LOW (ref 3.5–5.2)
Bilirubin, Direct: 0.1 mg/dL (ref 0.0–0.3)

## 2012-01-31 LAB — BASIC METABOLIC PANEL
BUN: 8 mg/dL (ref 6–23)
Calcium: 8.9 mg/dL (ref 8.4–10.5)
GFR calc non Af Amer: >90 mL/min (ref >90)
Glucose, Bld: 389 mg/dL — ABNORMAL HIGH (ref 70–99)
Sodium: 134 mEq/L — ABNORMAL LOW (ref 135–145)

## 2012-01-31 LAB — LIPID PANEL
HDL: 31 mg/dL — ABNORMAL LOW (ref >39)
Triglycerides: 166 mg/dL — ABNORMAL HIGH (ref <150)

## 2012-01-31 LAB — MAGNESIUM: Magnesium: 1.8 mg/dL (ref 1.5–2.5)

## 2012-01-31 LAB — CK TOTAL AND CKMB (NOT AT ARMC)
CK, MB: 2.6 ng/mL (ref 0.3–4.0)
Relative Index: 1.8 (ref 0.0–2.5)
Total CK: 148 U/L (ref 7–232)

## 2012-01-31 LAB — URINALYSIS, ROUTINE W REFLEX MICROSCOPIC
Bilirubin Urine: NEGATIVE
Glucose, UA: >1000 mg/dL — AB
Ketones, ur: NEGATIVE mg/dL
Leukocytes, UA: NEGATIVE
pH: 5 (ref 5.0–8.0)

## 2012-01-31 LAB — GLUCOSE, CAPILLARY
Glucose-Capillary: 338 mg/dL — ABNORMAL HIGH (ref 70–99)
Glucose-Capillary: 288 mg/dL — ABNORMAL HIGH (ref 70–99)

## 2012-01-31 LAB — HEMOGLOBIN A1C: Mean Plasma Glucose: 361 mg/dL — ABNORMAL HIGH (ref <117)

## 2012-01-31 LAB — TSH: TSH: 0.289 u[IU]/mL — ABNORMAL LOW (ref 0.350–4.500)

## 2012-01-31 LAB — HIV ANTIBODY (ROUTINE TESTING W REFLEX): HIV: NONREACTIVE

## 2012-01-31 LAB — TROPONIN I: Troponin I: <0.3 ng/mL (ref <0.30)

## 2012-01-31 MED ORDER — TAMSULOSIN HCL 0.4 MG PO CAPS
0.8000 mg | ORAL_CAPSULE | Freq: Every day | ORAL | Status: DC
  Administered 2012-01-31: 0.8 mg via ORAL
  Filled 2012-01-31 (×2): qty 2

## 2012-01-31 MED ORDER — LISINOPRIL 10 MG PO TABS
10.0000 mg | ORAL_TABLET | Freq: Every day | ORAL | Status: DC
  Administered 2012-01-31 – 2012-02-01 (×2): 10 mg via ORAL
  Filled 2012-01-31 (×3): qty 1

## 2012-01-31 MED ORDER — ATENOLOL 50 MG PO TABS
50.0000 mg | ORAL_TABLET | Freq: Every day | ORAL | Status: DC
Start: 2012-01-31 — End: 2012-02-02
  Administered 2012-01-31 – 2012-02-02 (×3): 50 mg via ORAL
  Filled 2012-01-31: qty 2
  Filled 2012-01-31 (×2): qty 1

## 2012-01-31 MED ORDER — INSULIN ASPART 100 UNIT/ML ~~LOC~~ SOLN
0.0000 [IU] | Freq: Three times a day (TID) | SUBCUTANEOUS | Status: DC
  Administered 2012-01-31: 7 [IU] via SUBCUTANEOUS
  Administered 2012-01-31: 5 [IU] via SUBCUTANEOUS

## 2012-01-31 MED ORDER — INSULIN ASPART 100 UNIT/ML ~~LOC~~ SOLN: 0.0000 [IU] | Freq: Three times a day (TID) | SUBCUTANEOUS | Status: DC

## 2012-01-31 MED ORDER — SIMVASTATIN 20 MG PO TABS
20.0000 mg | ORAL_TABLET | Freq: Every day | ORAL | Status: DC
  Administered 2012-01-31 – 2012-02-01 (×2): 20 mg via ORAL
  Filled 2012-01-31 (×3): qty 1

## 2012-01-31 MED ORDER — ASPIRIN EC 81 MG PO TBEC
81.0000 mg | DELAYED_RELEASE_TABLET | Freq: Every day | ORAL | Status: DC
  Administered 2012-02-01 – 2012-02-02 (×2): 81 mg via ORAL
  Filled 2012-01-31 (×3): qty 1

## 2012-01-31 MED ORDER — ENOXAPARIN SODIUM 60 MG/0.6ML ~~LOC~~ SOLN
60.0000 mg | SUBCUTANEOUS | Status: DC
  Administered 2012-01-31 – 2012-02-02 (×3): 60 mg via SUBCUTANEOUS
  Filled 2012-01-31 (×3): qty 0.6

## 2012-01-31 MED ORDER — INSULIN GLARGINE 100 UNIT/ML ~~LOC~~ SOLN
10.0000 [IU] | Freq: Every day | SUBCUTANEOUS | Status: DC
  Administered 2012-01-31 – 2012-02-01 (×2): 10 [IU] via SUBCUTANEOUS

## 2012-01-31 MED ORDER — SODIUM CHLORIDE 0.9 % IV BOLUS (SEPSIS)
1000.0000 mL | Freq: Once | INTRAVENOUS | Status: AC
  Administered 2012-01-31: 1000 mL via INTRAVENOUS

## 2012-01-31 MED ORDER — TECHNETIUM TC 99M SESTAMIBI GENERIC - CARDIOLITE
30.0000 | Freq: Once | INTRAVENOUS | Status: AC | PRN
  Administered 2012-01-31: 30 via INTRAVENOUS

## 2012-01-31 MED ORDER — INSULIN ASPART 100 UNIT/ML ~~LOC~~ SOLN
0.0000 [IU] | Freq: Three times a day (TID) | SUBCUTANEOUS | Status: DC
  Administered 2012-02-01: 15 [IU] via SUBCUTANEOUS
  Administered 2012-02-01: 8 [IU] via SUBCUTANEOUS
  Administered 2012-02-02: 5 [IU] via SUBCUTANEOUS

## 2012-01-31 MED ORDER — NITROGLYCERIN 0.4 MG SL SUBL: 0.4000 mg | SUBLINGUAL_TABLET | SUBLINGUAL | Status: DC | PRN

## 2012-01-31 MED ORDER — ASPIRIN 81 MG PO CHEW
324.0000 mg | CHEWABLE_TABLET | Freq: Once | ORAL | Status: AC
  Administered 2012-01-31: 324 mg via ORAL
  Filled 2012-01-31: qty 4

## 2012-01-31 MED ORDER — MORPHINE SULFATE 2 MG/ML IJ SOLN
1.0000 mg | INTRAMUSCULAR | Status: DC | PRN
Start: 2012-01-31 — End: 2012-02-02

## 2012-01-31 MED ORDER — SODIUM CHLORIDE 0.9 % IJ SOLN
3.0000 mL | Freq: Two times a day (BID) | INTRAMUSCULAR | Status: DC
  Administered 2012-01-31 – 2012-02-02 (×5): 3 mL via INTRAVENOUS

## 2012-01-31 MED ORDER — INSULIN ASPART 100 UNIT/ML ~~LOC~~ SOLN
0.0000 [IU] | Freq: Every day | SUBCUTANEOUS | Status: DC
  Administered 2012-01-31: 3 [IU] via SUBCUTANEOUS

## 2012-01-31 NOTE — H&P (Signed)
Hospital Admission Note Date: 01/31/2012  Patient name: Terry Parks Medical record number: [id number] Date of birth: October 16, 1955 Age: 56 y.o. Gender: male PCP: Farley Ly, MD  Medical Service: Internal Medicine Teaching Service   Attending physician:  Dr Criselda Peaches     1st Contact: Dr Earlene Plater   Pager: [phone] 2nd Contact: Dr Dierdre Searles    Pager:[phone]  After 5 pm or weekends: 1st Contact:      Pager: [phone] 2nd Contact:      Pager: [phone]  Chief Complaint: Per family request came to the ED  History of Present Illness:  This is the 56 year old old pleasant gentleman with past medical history significant for hypertension, hyperlipidemia and diabetes mellitus who presented to the ED per family request. Patient reports that he was complaining about chest pain and shortness of breath as well as high blood sugars and therefore the family requested to be evaluated in the ED. Patient noted that he has not been taking his medication since almost one year since he has no insurance. Patient's chest pain is occasionally sharp but most of the time a dull and aching pain, localized in the substernal area and moving into his left arm and neck/shoulder area. He just feels somewhat numb and tingling at times in the left arm. This has been going on for at least 3 months. The pain can come on during rest or with exertion but is resolved with rest. It is further associated with some shortness of breath with mild nonproductive cough. During the episode of the pain he just does not feel right. Apart from the shortness of breath he denies any dizziness, nausea, abdominal pain. He has not seek medical help. Has not taken any medication over-the-counter to relieve the pain. Patient further endorses thirst and is somewhat not able to keep up with his fluid intake he sees. Even after drinking a lot of fluids he still feels very thirsty. He noted that he has no increased urinary frequency but mentioned urinary  hesitance and trouble to initiate. He thinks he has a large prostate and someone gave him something for the medication but he is not completely sure. He reports he had a colonoscopy done and everything was normal in the past.  The patient denies any sick contact but reports that he traveled on the train to the [facility] from Thursday to Saturday.  Meds: Current Outpatient Rx  Name Route Sig Dispense Refill  . GLUCOSE BLOOD VI STRP  Use as directed to test blood sugar three times a day before meals.     [name] LANCETS MISC  Use to test blood sugar three times a day before meals.     . ATENOLOL 50 MG PO TABS Oral Take 50 mg by mouth daily.      . INSULIN GLARGINE 100 UNIT/ML [place] SOLN Subcutaneous Inject 12 Units into the skin at bedtime.      [name] METFORMIN HCL 1000 MG PO TABS Oral Take 1 tablet (1,000 mg total) by mouth 2 (two) times daily. 60 tablet 5  . OMEPRAZOLE 20 MG PO CPDR Oral Take 20 mg by mouth daily.      [name] PRAVASTATIN SODIUM 40 MG PO TABS Oral Take 80 mg by mouth daily.      . QUINAPRIL HCL 10 MG PO TABS Oral Take 10 mg by mouth daily.      [name] TAMSULOSIN HCL 0.4 MG PO CAPS Oral Take 0.8 mg by mouth daily.        Allergies: Allergies  as of 01/31/2012  . (No Known Allergies)   Past Medical History  Diagnosis Date  . Diabetes mellitus 04/08/2008  . Hypertension   . Hyperlipemia   . Obstructive sleep apnea 03/06/2008    Sleep study 03/06/08 showed severe OSA/hypopnea syndrome, with successful CPAP titration to 13 CWP using a medium ResMed Mirage Quattro full face mask with heated humidifier.   . Cardiomegaly     Mild by CXR 11/04/04  . BPH (benign prostatic hypertrophy)     Massive BPH noted on cystoscopy 1/23/ 2012 by Dr. Isabel Caprice.  . Renal calculus 05/29/2010    CT scan of abdomen/pelvis on 05/29/2010 showed an obstructing approximate 1-2 mm calculus at the left UVJ, and an approximate 1-2 mm left lower pole renal calculus.   Patient had continuing severe pain , and an elevation of  his serum creatinine to a value of 1.75 on 06/06/2010.  The stone had apparently passed and was not seen on repeat CT 06/08/2010.  [name] Headache   . Allergic rhinitis   . Dyspepsia   . Dermatitis   . Neck pain   . Pleuritis   . Abscess of groin   . Abscessed tooth    History reviewed. No pertinent past surgical history. Family History  Problem Relation Age of Onset  . Breast cancer Mother   . Diabetes Mother   . Colon cancer Neg Hx   . Prostate cancer Neg Hx   . Heart attack Neg Hx   . Hypertension Father    History   Social History  . Marital Status: Single    Spouse Name: N/A    Number of Children: N/A  . Years of Education: N/A   Occupational History  . Not on file.   Social History Main Topics  . Smoking status: Never Smoker   . Smokeless tobacco: Not on file  . Alcohol Use: No  . Drug Use: No  . Sexually Active: Not on file   Other Topics Concern  . Not on file   Social History Narrative  .  patient lives in [place]. 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 He has 5 boys ; the youngest 21 the oldest 32      Review of Systems: Bold if positive  Constitutional: fever, chills, diaphoresis, appetite change and fatigue.  HEENT: Blurry vision, congestion Respiratory:  SOB, DOE, cough, chest tightness,  and wheezing.   Cardiovascular:  chest pain, palpitations and leg swelling.  Gastrointestinal:  nausea, vomiting, abdominal pain, diarrhea, constipation, blood in stool ,  Genitourinary:  dysuria, urgency, frequency, hematuria, flank pain and difficulty urinating.  Skin: Denies pallor, rash and wound.  Neurological: Denies dizziness,syncope, weakness, light-headedness, numbness of both of his feet,  and headaches.   Physical Exam: Blood pressure 156/97, pulse 98, temperature 98.3 F (36.8 C), temperature source Oral, resp. rate 18, SpO2 95.00%. Constitutional: Vital signs reviewed.  Patient is a obese male in no acute distress and  cooperative with exam. Alert and oriented x3.  Head: Normocephalic and atraumatic Mouth: no erythema or exudates, MMM Eyes: PERRL, EOMI, conjunctivae normal, No scleral icterus.  Neck: Supple,  Cardiovascular: Tachycardic buy regular , S1 normal, S2 normal, pulses symmetric and intact bilaterally Pulmonary/Chest: CTAB, no wheezes, rales, or rhonchi Abdominal: Soft. Non-tender, non-distended, bowel sounds are normal,  Neurological: A&O x3, Strenght is normal and symmetric bilaterally,  no focal motor deficit, sensory intact to light touch bilaterally.  Skin: Warm, dry and intact. No rash, cyanosis, or  clubbing.    Lab results: Basic Metabolic Panel:  [facility] 01/31/12 0849  NA 134*  K 4.0  CL 99  CO2 26  GLUCOSE 389*  BUN 8  CREATININE 0.84  CALCIUM 8.9  MG --  PHOS --   LFT 06/2010 Alkaline phosphatase 64 Albumin 4 AST 16 ALT 23 Total protein 7.2 Total bili 0.4  CBC:  Basename 01/31/12 0849  WBC 6.3  NEUTROABS 3.3  HGB 14.1  HCT 41.6  MCV 79.8  PLT 140*   Cardiac Enzymes:  Basename 01/31/12 0840  CKTOTAL --  CKMB --  CKMBINDEX --  TROPONINI <0.30    CBG:  Basename 01/31/12 0910  GLUCAP 338*   Hemoglobin A1C: Lab Results  Component Value Date   HGBA1C 6.6 07/15/2010    Fasting Lipid Panel: Lab Results  Component Value Date   CHOL  Value: 121        ATP III CLASSIFICATION:  <200     mg/dL   Desirable  161-096  mg/dL   Borderline High  >=045    mg/dL   High        08/23/8117   HDL 24* 06/06/2010   LDLCALC  Value: 80        Total Cholesterol/HDL:CHD Risk Coronary Heart Disease Risk Table                     Men   Women  1/2 Average Risk   3.4   3.3  Average Risk       5.0   4.4  2 X Average Risk   9.6   7.1  3 X Average Risk  23.4   11.0        Use the calculated Patient Ratio above and the CHD Risk Table to determine the patient's CHD Risk.        ATP III CLASSIFICATION (LDL):  <100     mg/dL   Optimal  147-829  mg/dL   Near or Above                     Optimal  130-159  mg/dL   Borderline  562-130  mg/dL   High  >865     mg/dL   Very High 7/84/6962   TRIG 83 06/06/2010   CHOLHDL 5.0 06/06/2010     Thyroid Function Tests: Lab Results  Component Value Date   TSH 0.389 06/05/2007    Urine Drug Screen: Drugs of Abuse     Component Value Date/Time   LABOPIA POSITIVE* 06/05/2010 1115   COCAINSCRNUR NONE DETECTED 06/05/2010 1115   LABBENZ NONE DETECTED 06/05/2010 1115   AMPHETMU NONE DETECTED 06/05/2010 1115   THCU NONE DETECTED 06/05/2010 1115   LABBARB  Value: NONE DETECTED        DRUG SCREEN FOR MEDICAL PURPOSES ONLY.  IF CONFIRMATION IS NEEDED FOR ANY PURPOSE, NOTIFY LAB WITHIN 5 DAYS.        LOWEST DETECTABLE LIMITS FOR URINE DRUG SCREEN Drug Class       Cutoff (ng/mL) Amphetamine      1000 Barbiturate      200 Benzodiazepine   200 Tricyclics       300 Opiates          300 Cocaine          300 THC              50 06/05/2010 1115     Imaging results:  Dg Chest 2  View  01/31/2012  *RADIOLOGY REPORT*  Clinical Data: Chest pain, hypertension, diabetes  CHEST - 2 VIEW  Comparison: 02/21/2007  Findings: Decreased lung volumes. Enlargement of cardiac silhouette. Pulmonary vascular ingestion. Bibasilar atelectasis. Questionable nodular density versus prominent vascular marking in right upper lobe, 7 mm diameter. No pleural effusion or pneumothorax.  IMPRESSION: Enlargement of cardiac silhouette. Decreased lung volumes with bibasilar atelectasis. Questionable nodular density versus prominent vascular marking right upper lobe; either CT chest to exclude pulmonary nodule or follow-up radiographs to ensure resolution recommended.   Original Report Authenticated By: Lollie Marrow, M.D.     Assessment & Plan by Problem:  1. Chest pain concerning for ACS. Patient has intermittent chest pain which is present with rest and at exertion along with radiation to left arm and neck area as well as shortness of breath. Patient has significant risk factors including  hypertension , diabetes, obesity and hyperlipidemia with noncompliance. First set of cardiac enzymes negative. Initial EKG no signs of ischemic event. Chest x-ray is negative for pneumonia, pneumothorax, aortic dissection but is notable for an enlarged cardiac silhouette. PE is a possibility considering patient's tachycardia and Geneva score of 5 which places the patient in intermediate risk but the Well's score indicates PE unlikely with a score of 1.5.  - Will admit to telemetry -Cycle cardiac enzymes - Will obtain TSH -Repeat EKG in the morning -2-D echo to evaluate LV function -UDS - Will start Aspirin 81 mg, atenolol 50 mg, enalapril 10 mg and simvastatin 20 mg -Consult cardiology for further evaluation and management  2. Diabetes mellitus, last hemoglobin A1c was 6.6 in 06/2010 but patient has not been taking his medication since over one year. Per records patient should have been on Lantus 12 units and metformin 1000 mg twice a day. - Will obtain hemoglobin A1c - Will place sliding scale insulin sensitive in may need to start Lantus - Will hold metformin - Need outpatient referral to ophthalmology considering patient's history of blurry vision - Will obtain urine analysis to evaluate for proteinuria. May need microalbumin/creatinine  Ratio - Educated patient to check his feet on a regular basis considering patient's history of numbness and tingling in his feet.  3. Hypertension, blood pressure significantly elevated in the setting of noncompliance. Per records patient has been on atenolol and enalapril. - Considering concern for ACS I will restart atenolol 50 mg and  enalapril 10 mg  4.Hyperlipidemia, noncompliance with medication - Will check lipid panel -Will restart simvastatin 20 mg daily  5. Obstructive sleep apnea - Will place on CPAP  6. History of BPH - Will restart Flomax  7. Questionable nodular density versus prominent vascular marking  on right upper lobe per CXray .    - Recommendation per Radiology either CT chest to exclude pulmonary nodule or follow-up radiographs to ensure resolution recommended.   8. DVT ppx: Lovenox   Signed: Danielly Ackerley 01/31/2012, 9:45 AM

## 2012-01-31 NOTE — Consult Note (Signed)
Cardiology Consult Note  Admit date: 01/31/2012 Name: Terry Parks 56 y.o.  male DOB:  [birth year] MRN:  [id number]  Today's date:  01/31/2012  Referring Physician:    Internal Medicine Teaching Service  Primary Physician: Internal Medicine Teaching Service  Reason for Consultation:  Chest pain  IMPRESSIONS: 1. Chest pain that has been chronic for 2-3 weeks with some atypical features in a patient with multiple cardiac risk factors. The description of the pain was not typical for angina and it has been present for some time. This also was occurred in the setting of significant medical noncompliance. 2. Diabetes mellitus 3. Hypertensive heart disease 4. Medical noncompliance 5. Hyperlipidemia 6. Obstructive sleep apnea 7. Morbid obesity  RECOMMENDATION: This all occurred in the setting of medical noncompliance and stopping his medications. His EKG shows some lateral T wave inversions. He has significant morbid obesity. I would suggest a 2 day Lexiscan Cardiolite scan as a screen for ischemia. Don't think he needs to be anticoagulated at this time. Obviously lifestyle modifications in order.  HISTORY: This 56 year old male has a long-standing history of hypertension, diabetes hyperlipidemia sleep apnea and morbid obesity. He has been working for vocational rehabilitation recently. He had quit taking a number of his diabetic and hypertensive medications recently. He began to have some chest discomfort about 2-3 weeks ago described as a sharp chest pain and also describes numbness and tingling sometimes in the left arm. The discomfort is not related to exertion it can come on during rest and associated with some shortness of breath and a mild nonproductive cough. After dealing with this for several weeks his family decided to bring him to the emergency room today. He mentions poor diabetic control. He asked about having to take chips insults to eat even while I was in the room. He has been  quite heavy. He denies exertional angina and has no PND, orthopnea or edema.   Past Medical History  Diagnosis Date  . Diabetes mellitus 04/08/2008  . Hypertension   . Hyperlipemia   . Obstructive sleep apnea 03/06/2008    Sleep study 03/06/08 showed severe OSA/hypopnea syndrome, with successful CPAP titration to 13 CWP using a medium ResMed Mirage Quattro full face mask with heated humidifier.   . Cardiomegaly     Mild by CXR 11/04/04  . BPH (benign prostatic hypertrophy)     Massive BPH noted on cystoscopy 1/23/ 2012 by Dr. Isabel Caprice.  . Renal calculus 05/29/2010    CT scan of abdomen/pelvis on 05/29/2010 showed an obstructing approximate 1-2 mm calculus at the left UVJ, and an approximate 1-2 mm left lower pole renal calculus.   Patient had continuing severe pain , and an elevation of his serum creatinine to a value of 1.75 on 06/06/2010.  The stone had apparently passed and was not seen on repeat CT 06/08/2010.  [name] Headache   . Allergic rhinitis   . Neck pain   . Abscess of groin   . Abscessed tooth       Past Surgical History  Procedure Date  . Cystoscopy w/ retrogrades     Allergies:    has no known allergies.   Medications: Prior to Admission medications   Medication Sig Start Date End Date Taking? Authorizing Provider  glucose blood (TRUETRACK TEST) test strip Use as directed to test blood sugar three times a day before meals.    Yes Historical Provider, MD  Lancets MISC Use to test blood sugar three times a day before meals.  Yes Historical Provider, MD  atenolol (TENORMIN) 50 MG tablet Take 50 mg by mouth daily.      Historical Provider, MD  insulin glargine (LANTUS SOLOSTAR) 100 UNIT/ML injection Inject 12 Units into the skin at bedtime.      Historical Provider, MD  metFORMIN (GLUCOPHAGE) 1000 MG tablet Take 1 tablet (1,000 mg total) by mouth 2 (two) times daily. 01/27/11   Zoila Shutter, MD  omeprazole (PRILOSEC) 20 MG capsule Take 20 mg by mouth daily.      Historical  Provider, MD  pravastatin (PRAVACHOL) 40 MG tablet Take 80 mg by mouth daily.      Historical Provider, MD  quinapril (ACCUPRIL) 10 MG tablet Take 10 mg by mouth daily.      Historical Provider, MD  Tamsulosin HCl (FLOMAX) 0.4 MG CAPS Take 0.8 mg by mouth daily.      Historical Provider, MD   Family History: Family Status  Relation Status Death Age  . Mother Alive     dementia  . Father Deceased 67    died of comps of BP  . Brother Deceased 21    died of MI  . Brother Alive   . Brother Alive   . Brother Alive   . Sister Alive     BP  . Sister Alive     BP    Social History:   reports that he has never smoked. He has never used smokeless tobacco. He reports that he does not drink alcohol or use illicit drugs.   History   Social History Narrative   Divorced, 4 children, lives alone.  Works for [employer].    Review of Systems: He complains of frequent urination as well as a feeling of overflow incontinence. He occasionally will have some indigestion and nausea. He does not have significant arthritis. He has complained of some weakness. He has a prior history of nephrolithiasis. He complains of occasional numbness and tingling in his feet.  Other than as noted above the remainder of the review of systems is unremarkable.    Physical Exam: Blood pressure 136/92, pulse 80, temperature 98.4 F (36.9 C), temperature source Oral, resp. rate 20, height 6\' 1"  (1.854 m), weight 131.362 kg (289 lb 9.6 oz), SpO2 97.00%.    General appearance: alert, cooperative, appears stated age and morbidly obese Head: Normocephalic, without obvious abnormality, atraumatic Eyes: conjunctivae/corneas clear. PERRL, EOM's intact. Fundi benign. Throat: Teeth in fair repair Neck: no adenopathy, no carotid bruit, no JVD and supple, symmetrical, trachea midline Lungs: clear to auscultation bilaterally Chest wall: no tenderness Heart: regular rate and rhythm, S1, S2 normal, no murmur,  click, rub or gallop Abdomen: soft, non-tender; bowel sounds normal; no masses,  no organomegaly Rectal: deferred Extremities: extremities normal, atraumatic, no cyanosis or edema Pulses: 2+ and symmetric Skin: Skin color, texture, turgor normal. No rashes or lesions Neurologic: Grossly normal  Labs: CBC  Basename 01/31/12 0849  WBC 6.3  RBC 5.21  HGB 14.1  HCT 41.6  PLT 140*  MCV 79.8  MCH 27.1  MCHC 33.9  RDW 12.5  LYMPHSABS 2.4  MONOABS 0.5  EOSABS 0.1  BASOSABS 0.0   CMP   Basename 01/31/12 1400 01/31/12 0849  NA -- 134*  K -- 4.0  CL -- 99  CO2 -- 26  GLUCOSE -- 389*  BUN -- 8  CREATININE -- 0.84  CALCIUM -- 8.9  PROT 6.7 --  ALBUMIN 3.3* --  AST 16 --  ALT 18 --  ALKPHOS 98 --  BILITOT 0.5 --  GFRNONAA -- >90  GFRAA -- >90   Cardiac Panel (last 3 results)  Basename 01/31/12 1406 01/31/12 1057 01/31/12 0840  CKTOTAL -- 148 --  CKMB -- 3.2 --  TROPONINI <0.30 -- <0.30  RELINDX -- 2.2 --     Radiology: Chest x-ray shows cardiac enlargement  EKG: Normal sinus rhythm, left axis deviation, left atrial enlargement, voltage for LVH with lateral T wave inversions.  Signed:  Darden Palmer MD [facility]   Cardiology Consultant  01/31/2012, 5:24 PM

## 2012-01-31 NOTE — Progress Notes (Signed)
   CARE MANAGEMENT ED NOTE 01/31/2012  Patient:  JAKAIDEN, FILL   Account Number:  [id number]  Date Initiated:  01/31/2012  Documentation initiated by:  Fransico Michael  Subjective/Objective Assessment:   presented to ED with c/o chest pain     Subjective/Objective Assessment Detail:   admitting physician ordered admit to inpatient status.     Action/Plan:   Action/Plan Detail:   Anticipated DC Date:  02/02/2012     Status Recommendation to Physician:  Recommend OBV Status instead of IP Result of Recommendation:  Agreed  Other ED Services  Appropriate Status Consult    DC Planning Services  CM consult    Choice offered to / List presented to:            Status of service:  Completed, signed off  ED Comments:   ED Comments Detail:  01/31/12-1033-J.Amayrani Bennick,RN,BSN [phone]      Noted flag for admission as inpatient. Order for inpatient status noted in chart. Reviewed EMR and noted that patient would be more suited to observation status for right now. Dr. Earlene Plater notified via phone and orders received to change patient to observation status. Patient is still in ED. Orders updated in Epic. No further needs identified at present.

## 2012-01-31 NOTE — ED Provider Notes (Signed)
History     CSN: [id number]  Arrival date & time 01/31/12  9562   First MD Initiated Contact with Patient 01/31/12 [phone]      Chief Complaint  Patient presents with  . Chest Pain    (Consider location/radiation/quality/duration/timing/severity/associated sxs/prior treatment) HPI Pt with history of DM, HTN has been noncompliant with his medications for several months. States checked his sugar at home a few days ago and it was >500. He also reports intermittent fleeting, aching/sharp L sided chest pain off and on for several months, occasionally associated with SOB, no particular provoking or relieving factors. Symptoms last for a few seconds at a time. No known history of CAD.   Past Medical History  Diagnosis Date  . Diabetes mellitus 04/08/2008  . Hypertension   . Hyperlipemia   . Obstructive sleep apnea 03/06/2008    Sleep study 03/06/08 showed severe OSA/hypopnea syndrome, with successful CPAP titration to 13 CWP using a medium ResMed Mirage Quattro full face mask with heated humidifier.   . Cardiomegaly     Mild by CXR 11/04/04  . BPH (benign prostatic hypertrophy)     Massive BPH noted on cystoscopy 1/23/ 2012 by Dr. Isabel Caprice.  . Renal calculus 05/29/2010    CT scan of abdomen/pelvis on 05/29/2010 showed an obstructing approximate 1-2 mm calculus at the left UVJ, and an approximate 1-2 mm left lower pole renal calculus.   Patient had continuing severe pain , and an elevation of his serum creatinine to a value of 1.75 on 06/06/2010.  The stone had apparently passed and was not seen on repeat CT 06/08/2010.  [name] Headache   . Allergic rhinitis   . Dyspepsia   . Dermatitis   . Neck pain   . Pleuritis   . Abscess of groin   . Abscessed tooth     History reviewed. No pertinent past surgical history.  Family History  Problem Relation Age of Onset  . Breast cancer Mother   . Diabetes Mother   . Colon cancer Neg Hx   . Prostate cancer Neg Hx   . Heart attack Neg Hx   .  Hypertension Father     History  Substance Use Topics  . Smoking status: Never Smoker   . Smokeless tobacco: Not on file  . Alcohol Use: No      Review of Systems All other systems reviewed and are negative except as noted in HPI.   Allergies  Review of patient's allergies indicates no known allergies.  Home Medications   Current Outpatient Rx  Name Route Sig Dispense Refill  . GLUCOSE BLOOD VI STRP  Use as directed to test blood sugar three times a day before meals.     [name] LANCETS MISC  Use to test blood sugar three times a day before meals.     . ATENOLOL 50 MG PO TABS Oral Take 50 mg by mouth daily.      . INSULIN GLARGINE 100 UNIT/ML [place] SOLN Subcutaneous Inject 12 Units into the skin at bedtime.      [name] METFORMIN HCL 1000 MG PO TABS Oral Take 1 tablet (1,000 mg total) by mouth 2 (two) times daily. 60 tablet 5  . OMEPRAZOLE 20 MG PO CPDR Oral Take 20 mg by mouth daily.      [name] PRAVASTATIN SODIUM 40 MG PO TABS Oral Take 80 mg by mouth daily.      . QUINAPRIL HCL 10 MG PO TABS Oral Take 10 mg by mouth daily.      [name]  TAMSULOSIN HCL 0.4 MG PO CAPS Oral Take 0.8 mg by mouth daily.        BP 156/97  Pulse 98  Temp 98.3 F (36.8 C) (Oral)  Resp 18  SpO2 95%  Physical Exam  Nursing note and vitals reviewed. Constitutional: He is oriented to person, place, and time. He appears well-developed and well-nourished.  HENT:  Head: Normocephalic and atraumatic.  Eyes: EOM are normal. Pupils are equal, round, and reactive to light.  Neck: Normal range of motion. Neck supple.  Cardiovascular: Normal rate, normal heart sounds and intact distal pulses.   Pulmonary/Chest: Effort normal and breath sounds normal.  Abdominal: Bowel sounds are normal. He exhibits no distension. There is no tenderness.  Musculoskeletal: Normal range of motion. He exhibits no edema and no tenderness.  Neurological: He is alert and oriented to person, place, and time. He has normal strength. No cranial nerve  deficit or sensory deficit.  Skin: Skin is warm and dry. No rash noted.  Psychiatric: He has a normal mood and affect.    ED Course  Procedures (including critical care time)  Labs Reviewed  CBC WITH DIFFERENTIAL - Abnormal; Notable for the following:    Platelets 140 (*)     All other components within normal limits  BASIC METABOLIC PANEL - Abnormal; Notable for the following:    Sodium 134 (*)     Glucose, Bld 389 (*)     All other components within normal limits  GLUCOSE, CAPILLARY - Abnormal; Notable for the following:    Glucose-Capillary 338 (*)     All other components within normal limits  TSH - Abnormal; Notable for the following:    TSH 0.289 (*)     All other components within normal limits  HEMOGLOBIN A1C - Abnormal; Notable for the following:    Hemoglobin A1C 14.2 (*)     Mean Plasma Glucose 361 (*)     All other components within normal limits  LIPID PANEL - Abnormal; Notable for the following:    Cholesterol 246 (*)     Triglycerides 166 (*)     HDL 31 (*)     LDL Cholesterol 182 (*)     All other components within normal limits  URINALYSIS, ROUTINE W REFLEX MICROSCOPIC - Abnormal; Notable for the following:    Specific Gravity, Urine 1.040 (*)     Glucose, UA >1000 (*)     All other components within normal limits  HEPATIC FUNCTION PANEL - Abnormal; Notable for the following:    Albumin 3.3 (*)     All other components within normal limits  GLUCOSE, CAPILLARY - Abnormal; Notable for the following:    Glucose-Capillary 311 (*)     All other components within normal limits  GLUCOSE, CAPILLARY - Abnormal; Notable for the following:    Glucose-Capillary 288 (*)     All other components within normal limits  GLUCOSE, CAPILLARY - Abnormal; Notable for the following:    Glucose-Capillary 269 (*)     All other components within normal limits  BASIC METABOLIC PANEL - Abnormal; Notable for the following:    Sodium 134 (*)     Glucose, Bld 329 (*)     All  other components within normal limits  GLUCOSE, CAPILLARY - Abnormal; Notable for the following:    Glucose-Capillary 290 (*)     All other components within normal limits  TROPONIN I  MAGNESIUM  CK TOTAL AND CKMB  CK TOTAL AND CKMB  CK  TOTAL AND CKMB  URINE RAPID DRUG SCREEN (HOSP PERFORMED)  HIV ANTIBODY (ROUTINE TESTING)  URINE MICROSCOPIC-ADD ON  TROPONIN I  TROPONIN I  CK TOTAL AND CKMB  CK TOTAL AND CKMB  CK TOTAL AND CKMB  CK TOTAL AND CKMB   Dg Chest 2 View  01/31/2012  *RADIOLOGY REPORT*  Clinical Data: Chest pain, hypertension, diabetes  CHEST - 2 VIEW  Comparison: 02/21/2007  Findings: Decreased lung volumes. Enlargement of cardiac silhouette. Pulmonary vascular ingestion. Bibasilar atelectasis. Questionable nodular density versus prominent vascular marking in right upper lobe, 7 mm diameter. No pleural effusion or pneumothorax.  IMPRESSION: Enlargement of cardiac silhouette. Decreased lung volumes with bibasilar atelectasis. Questionable nodular density versus prominent vascular marking right upper lobe; either CT chest to exclude pulmonary nodule or follow-up radiographs to ensure resolution recommended.   Original Report Authenticated By: Lollie Marrow, M.D.      1. Chest pain   2. Hyperglycemia   3. Non compliance w medication regimen       MDM   Date: 01/31/2012  Rate: 97  Rhythm: normal sinus rhythm  QRS Axis: normal  Intervals: normal  ST/T Wave abnormalities: nonspecific T wave changes  Conduction Disutrbances:none  Narrative Interpretation:   Old EKG Reviewed: none available    Pt with hyperglycemia and chest pain. Admit for further eval. noncompliance with medications and multiple risk factors for CAD.       Semaje Kinker B. Bernette Mayers, MD 02/01/12 [phone]

## 2012-01-31 NOTE — ED Notes (Signed)
Report called to Samantha, RN

## 2012-01-31 NOTE — ED Notes (Signed)
Pt c/o left sided CP intermittently x 4 months; pt sts not taking htn or DM meds x months and is hyperglycemic also; pt sts some SOB

## 2012-02-01 ENCOUNTER — Observation Stay (HOSPITAL_COMMUNITY): Payer: MEDICAID

## 2012-02-01 ENCOUNTER — Encounter (HOSPITAL_COMMUNITY): Payer: Self-pay | Admitting: *Deleted

## 2012-02-01 LAB — BASIC METABOLIC PANEL
CO2: 23 mEq/L (ref 19–32)
GFR calc non Af Amer: >90 mL/min (ref >90)
Glucose, Bld: 329 mg/dL — ABNORMAL HIGH (ref 70–99)
Potassium: 3.8 mEq/L (ref 3.5–5.1)
Sodium: 134 mEq/L — ABNORMAL LOW (ref 135–145)

## 2012-02-01 LAB — CK TOTAL AND CKMB (NOT AT ARMC)
Relative Index: 1.7 (ref 0.0–2.5)
Total CK: 143 U/L (ref 7–232)

## 2012-02-01 LAB — GLUCOSE, CAPILLARY: Glucose-Capillary: 431 mg/dL — ABNORMAL HIGH (ref 70–99)

## 2012-02-01 LAB — T4, FREE: Free T4: 1.01 ng/dL (ref 0.80–1.80)

## 2012-02-01 MED ORDER — GUAIFENESIN-CODEINE 100-10 MG/5ML PO SOLN
5.0000 mL | Freq: Once | ORAL | Status: AC
  Administered 2012-02-01: 5 mL via ORAL
  Filled 2012-02-01: qty 5

## 2012-02-01 MED ORDER — TECHNETIUM TC 99M SESTAMIBI - CARDIOLITE: 30.0000 | Freq: Once | INTRAVENOUS | Status: AC | PRN

## 2012-02-01 MED ORDER — REGADENOSON 0.4 MG/5ML IV SOLN
0.4000 mg | Freq: Once | INTRAVENOUS | Status: AC
  Administered 2012-02-01: 0.4 mg via INTRAVENOUS
  Filled 2012-02-01: qty 5

## 2012-02-01 MED ORDER — TECHNETIUM TC 99M SESTAMIBI GENERIC - CARDIOLITE
30.0000 | Freq: Once | INTRAVENOUS | Status: AC | PRN
  Administered 2012-02-01: 30 via INTRAVENOUS

## 2012-02-01 MED ORDER — ACETAMINOPHEN 500 MG PO TABS
1000.0000 mg | ORAL_TABLET | Freq: Four times a day (QID) | ORAL | Status: DC | PRN
  Filled 2012-02-01: qty 2

## 2012-02-01 NOTE — Progress Notes (Signed)
Utilization review completed.  

## 2012-02-01 NOTE — Progress Notes (Signed)
pts CBG 431;Dr.Wallace paged and made aware, told to give 15 units of novolog

## 2012-02-01 NOTE — Progress Notes (Signed)
Spoke to patient about his elevated A1c of 14.2% (01/31/12).  Discussed A1C results with him and explained what an A1C is and the importance of checking CBGs and maintaining good CBG control to prevent long-term complications.  Patient told me he has not taken his medication in a while and that he "fell off the wagon" and was "not doing good".  Patient told me he plans to re-enlist in the [employer] card program to get his Lantus for less money and that he also plans to "do better" to get his A1c down.  Encouraged patient to record all his CBGs for his PCP.    Will follow. Ambrose Finland RN, MSN, CDE Diabetes Coordinator Inpatient Diabetes Program [phone]

## 2012-02-01 NOTE — H&P (Signed)
Internal Medicine Teaching Service Attending Note Date: 02/01/2012  Patient name: Terry Parks  Medical record number: [id number]  Date of birth: 09/29/1955   I have seen and evaluated Terry Parks and discussed their care with the Residency Team.    Terry Parks is a pleasant 56yo man, previous patient of Dr. Meredith Parks, who presented at his family's urging.  He notes that over the past few years he had lost his job and subsequently not gotten any of his medications filled.  He therefore has not had treatment for his diabetes, HTN, or hyperlipidemia.  He reports intermittent left substernal chest pain that comes and goes and is not consistently associated with activity (will occasionally come on when sitting at his desk).  He has also had episodes of SOB where he feels the need to catch his breath, though these are not exclusively associated with the chest pain. The pain occasionally moves into his left arm.  These symptoms have been persistent for 3 months.  He denies dizziness, nausea, vomiting, abdominal pain.   Further symptoms included polyuria, polydipsia and occasional blurry vision.   Physical Exam: Blood pressure 127/86, pulse 93, temperature 98.3 F (36.8 C), temperature source Oral, resp. rate 24, height 6\' 1"  (1.854 m), weight 300 lb 0.7 oz (136.1 kg), SpO2 97.00%. General appearance: alert, cooperative and appears stated age Head: Normocephalic, without obvious abnormality, atraumatic Neck: no adenopathy, supple, symmetrical, trachea midline and thyroid not enlarged, symmetric, no tenderness/mass/nodules Back: symmetric, no curvature. ROM normal. No CVA tenderness. CVS: NR, RR, normal S1 and S2, no noted murmurs Pulmonary: CTAB, no wheezes Abdomen: Soft, NT, ND, + BS Skin: Warm, dry and intact, no rash Pulses: 2+ and symmetrical  Lab results: Results for orders placed during the hospital encounter of 01/31/12 (from the past 24 hour(s))  GLUCOSE, CAPILLARY     Status:  Abnormal   Collection Time   01/31/12  5:31 PM      Component Value Range   Glucose-Capillary 288 (*) 70 - 99 mg/dL   Comment 1 Notify RN    CK TOTAL AND CKMB     Status: Normal   Collection Time   01/31/12  5:32 PM      Component Value Range   Total CK 148  7 - 232 U/L   CK, MB 2.9  0.3 - 4.0 ng/mL   Relative Index 2.0  0.0 - 2.5  TROPONIN I     Status: Normal   Collection Time   01/31/12  8:47 PM      Component Value Range   Troponin I <0.30  <0.30 ng/mL  GLUCOSE, CAPILLARY     Status: Abnormal   Collection Time   01/31/12  9:01 PM      Component Value Range   Glucose-Capillary 269 (*) 70 - 99 mg/dL  CK TOTAL AND CKMB     Status: Normal   Collection Time   01/31/12 10:37 PM      Component Value Range   Total CK 147  7 - 232 U/L   CK, MB 2.6  0.3 - 4.0 ng/mL   Relative Index 1.8  0.0 - 2.5  CK TOTAL AND CKMB     Status: Normal   Collection Time   02/01/12  5:11 AM      Component Value Range   Total CK 143  7 - 232 U/L   CK, MB 2.5  0.3 - 4.0 ng/mL   Relative Index 1.7  0.0 - 2.5  BASIC METABOLIC  PANEL     Status: Abnormal   Collection Time   02/01/12  5:11 AM      Component Value Range   Sodium 134 (*) 135 - 145 mEq/L   Potassium 3.8  3.5 - 5.1 mEq/L   Chloride 102  96 - 112 mEq/L   CO2 23  19 - 32 mEq/L   Glucose, Bld 329 (*) 70 - 99 mg/dL   BUN 14  6 - 23 mg/dL   Creatinine, Ser 6.96  0.50 - 1.35 mg/dL   Calcium 8.7  8.4 - 29.5 mg/dL   GFR calc non Af Amer >90  >90 mL/min   GFR calc Af Amer >90  >90 mL/min  GLUCOSE, CAPILLARY     Status: Abnormal   Collection Time   02/01/12  7:23 AM      Component Value Range   Glucose-Capillary 290 (*) 70 - 99 mg/dL  T4, FREE     Status: Normal   Collection Time   02/01/12  9:07 AM      Component Value Range   Free T4 1.01  0.80 - 1.80 ng/dL    Imaging results:  Dg Chest 2 View  01/31/2012  *RADIOLOGY REPORT*  Clinical Data: Chest pain, hypertension, diabetes  CHEST - 2 VIEW  Comparison: 02/21/2007  Findings: Decreased  lung volumes. Enlargement of cardiac silhouette. Pulmonary vascular ingestion. Bibasilar atelectasis. Questionable nodular density versus prominent vascular marking in right upper lobe, 7 mm diameter. No pleural effusion or pneumothorax.  IMPRESSION: Enlargement of cardiac silhouette. Decreased lung volumes with bibasilar atelectasis. Questionable nodular density versus prominent vascular marking right upper lobe; either CT chest to exclude pulmonary nodule or follow-up radiographs to ensure resolution recommended.   Original Report Authenticated By: Lollie Marrow, M.D.     Assessment and Plan: I agree with the formulated Assessment and Plan with the following changes:   1. Chest pain/SOB - Unclear if cardiac in origin as not classic, however, 2/2 other medical issues, will rule out for ACS with CE X 3 - Cardiology consult - Nuclear imaging once ruled out with CE - repeat EKG in the AM - Start statin, aspirin, beta blocker and ace-i  2. DM type 2 - A1C is pending - SSI - Further management can be made as an outpatient.  If Cr okay, can start metformin now and evaluate further in the outpatient setting  3. HTN - Starting beta blocker and Ace-I  4. HLD - Check lipid panel - Start statin for LDL > 100  Other chronic medical issues can be addressed once established with PCP   Terry Catalina, MD 9/17/20134:18 PM

## 2012-02-01 NOTE — Progress Notes (Signed)
  Echocardiogram 2D Echocardiogram has been performed.  Terry Parks 02/01/2012, 11:07 AM

## 2012-02-01 NOTE — Progress Notes (Signed)
Lexiscan cardiolite done without complications. Images to follow.  Darden Palmer MD [facility]

## 2012-02-01 NOTE — Progress Notes (Signed)
Subjective:    Interval Events:  Terry Parks feels well today.  He denies chest pain and dyspnea.  No other complaints.  We discussed his need for medications and his barriers to this.  He think that he can get back on track.    Objective:    Vital Signs:   Temp:  [97.8 F (36.6 C)-98.2 F (36.8 C)] 97.8 F (36.6 C) (09/17 1400) Pulse Rate:  [82-93] 93  (09/17 1400) Resp:  [18-21] 18  (09/17 1400) BP: (116-146)/(75-98) 145/98 mmHg (09/17 1400) SpO2:  [96 %-100 %] 100 % (09/17 1400) Weight:  [300 lb 0.7 oz (136.1 kg)] 300 lb 0.7 oz (136.1 kg) (09/17 0500) Last BM Date: 01/31/12   Weights: 24-hour Weight change:   Filed Weights   01/31/12 1258 01/31/12 1400 02/01/12 0500  Weight: 280 lb (127.007 kg) 289 lb 9.6 oz (131.362 kg) 300 lb 0.7 oz (136.1 kg)     Intake/Output:   Intake/Output Summary (Last 24 hours) at 02/01/12 1441 Last data filed at 02/01/12 1006  Gross per 24 hour  Intake    243 ml  Output      0 ml  Net    243 ml       Physical Exam: General appearance: alert, cooperative and no distress Resp: clear to auscultation bilaterally Cardio: regular rate and rhythm, S1, S2 normal, no murmur, click, rub or gallop GI: soft, non-tender; bowel sounds normal; no masses,  no organomegaly Extremities: extremities normal, atraumatic, no cyanosis or edema    Labs: Basic Metabolic Panel:  Lab 02/01/12 1610 01/31/12 1056 01/31/12 0849  NA 134* -- 134*  K 3.8 -- 4.0  CL 102 -- 99  CO2 23 -- 26  GLUCOSE 329* -- 389*  BUN 14 -- 8  CREATININE 0.90 -- 0.84  CALCIUM 8.7 -- 8.9  MG -- 1.8 --  PHOS -- -- --    Liver Function Tests:  Lab 01/31/12 1400  AST 16  ALT 18  ALKPHOS 98  BILITOT 0.5  PROT 6.7  ALBUMIN 3.3*   No results found for this basename: LIPASE:5,AMYLASE:5 in the last 168 hours No results found for this basename: AMMONIA:3 in the last 168 hours  CBC:  Lab 01/31/12 0849  WBC 6.3  NEUTROABS 3.3  HGB 14.1  HCT 41.6  MCV 79.8  PLT  140*    Cardiac Enzymes:  Lab 02/01/12 0511 01/31/12 2237 01/31/12 2047 01/31/12 1732 01/31/12 1406 01/31/12 1057 01/31/12 0840  CKTOTAL 143 147 -- 148 -- 148 --  CKMB 2.5 2.6 -- 2.9 -- 3.2 --  CKMBINDEX -- -- -- -- -- -- --  TROPONINI -- -- <0.30 -- <0.30 -- <0.30    BNP: No components found with this basename: POCBNP:5  CBG:  Lab 02/01/12 0723 01/31/12 2101 01/31/12 1731 01/31/12 1253 01/31/12 0910  GLUCAP 290* 269* 288* 311* 338*    Coagulation Studies: No results found for this basename: LABPROT:5,INR:5 in the last 72 hours  Microbiology: No results found for this or any previous visit.  Other results: EKG Results: Rate:  88 PR:  134 QRS:  96 QTc:  445 EKG: unchanged from previous tracings, normal sinus rhythm, nonspecific ST and T waves changes.  Imaging: Dg Chest 2 View  01/31/2012  *RADIOLOGY REPORT*  Clinical Data: Chest pain, hypertension, diabetes  CHEST - 2 VIEW  Comparison: 02/21/2007  Findings: Decreased lung volumes. Enlargement of cardiac silhouette. Pulmonary vascular ingestion. Bibasilar atelectasis. Questionable nodular density versus prominent vascular marking in  right upper lobe, 7 mm diameter. No pleural effusion or pneumothorax.  IMPRESSION: Enlargement of cardiac silhouette. Decreased lung volumes with bibasilar atelectasis. Questionable nodular density versus prominent vascular marking right upper lobe; either CT chest to exclude pulmonary nodule or follow-up radiographs to ensure resolution recommended.   Original Report Authenticated By: Lollie Marrow, M.D.       Medications:    Infusions:     Scheduled Medications:    . aspirin EC  81 mg Oral Daily  . atenolol  50 mg Oral Daily  . enoxaparin (LOVENOX) injection  60 mg Subcutaneous Q24H  . guaiFENesin-codeine  5 mL Oral Once  . insulin aspart  0-15 Units Subcutaneous TID WC  . insulin aspart  0-5 Units Subcutaneous QHS  . insulin glargine  10 Units Subcutaneous QHS  . lisinopril  10  mg Oral Daily  . regadenoson  0.4 mg Intravenous Once  . simvastatin  20 mg Oral q1800  . sodium chloride  3 mL Intravenous Q12H  . Tamsulosin HCl  0.8 mg Oral QHS  . DISCONTD: insulin aspart  0-9 Units Subcutaneous TID WC     PRN Medications: acetaminophen, morphine injection, nitroGLYCERIN, technetium sestamibi, technetium sestamibi generic, technetium sestamibi generic   Assessment/ Plan:    1.   Chest pain concerning for ACS:  Patient has intermittent chest pain which is present with rest and at exertion along with radiation to left arm and neck area as well as shortness of breath. Patient has significant risk factors including hypertension , diabetes, obesity and hyperlipidemia with noncompliance. Cardiac enzymes and EKG reassuring.  Chest x-ray is negative for pneumonia, pneumothorax, aortic dissection but is notable for an enlarged cardiac silhouette. PE is a possibility considering patient's tachycardia and Geneva score of 5 which places the patient in intermediate risk but the Well's score indicates PE unlikely with a score of 1.5.  Echocardiography demonstrated a reduced EF at 40% to 45% and a LVH pattern.  Global hypokinesis but no wall motion abnormality.   TSH was slightly low at 0.289 but free T4 was normal at 1.01. - MyoView results pending - Continue aspirin 81 mg, atenolol 50 mg, lisinopril 10 mg and simvastatin 20 mg   2.    Diabetes mellitus:  Last hemoglobin A1c was 6.6 in 06/2010 but patient has not been taking his medication since over one year. A1c now 14. Per records patient should have been on Lantus 12 units and metformin 1000 mg twice a day. Urine negative for protein. - On 10U Lantus here and correctional insulin - Need outpatient referral to ophthalmology considering patient's history of blurry vision  - Was on Lantus previously, probably home on a Lantus + metformin regimen since this is what he is comfortable with and then follow up with Lupita Leash next week.  3.    Hypertension:  Blood pressure elevated at presentation.  Restarted on atenolol and lisinopril with improvement. - Continue atenolol 50 and lisinopril 10  4.   Dyslipidemia:  Elevated LDL, TGY, and total cholesterol on FLP; HDL low.  LDL is 182.  Started on 20mg  simvastatin. - Discharge home on 40mg  simvastatin  5.   OSA:  CPAP here  6.   Hx of BPH:  Patient denies symptoms.  Will stop FloMax.  Consider treatment as outpatient if symptoms surface.  7.   Questionable nodular density versus prominent vascular marking on right upper lobe per CXray .  - Recommendation per Radiology either CT chest to exclude pulmonary nodule  or follow-up radiographs to ensure resolution recommended.   8.   Prophylaxis:  Enoxaparin  9.   Disposition:  Pending MyoView   Length of Stay: 1 days   Signed by:  Dorthula Rue. Earlene Plater, MD PGY-I, Internal Medicine Pager [phone]  02/01/2012, 2:41 PM

## 2012-02-02 ENCOUNTER — Observation Stay (HOSPITAL_COMMUNITY): Payer: MEDICAID

## 2012-02-02 DIAGNOSIS — J40 Bronchitis, not specified as acute or chronic: Secondary | ICD-10-CM

## 2012-02-02 DIAGNOSIS — R079 Chest pain, unspecified: Principal | ICD-10-CM

## 2012-02-02 DIAGNOSIS — G4733 Obstructive sleep apnea (adult) (pediatric): Secondary | ICD-10-CM

## 2012-02-02 DIAGNOSIS — E119 Type 2 diabetes mellitus without complications: Secondary | ICD-10-CM

## 2012-02-02 DIAGNOSIS — E785 Hyperlipidemia, unspecified: Secondary | ICD-10-CM

## 2012-02-02 LAB — GLUCOSE, CAPILLARY: Glucose-Capillary: 249 mg/dL — ABNORMAL HIGH (ref 70–99)

## 2012-02-02 MED ORDER — LOSARTAN POTASSIUM 25 MG PO TABS: 25.0000 mg | ORAL_TABLET | Freq: Every day | ORAL | Status: DC

## 2012-02-02 MED ORDER — BENZONATATE 100 MG PO CAPS
100.0000 mg | ORAL_CAPSULE | Freq: Three times a day (TID) | ORAL | Status: DC | PRN
  Administered 2012-02-02: 100 mg via ORAL
  Filled 2012-02-02: qty 1

## 2012-02-02 MED ORDER — ALBUTEROL SULFATE HFA 108 (90 BASE) MCG/ACT IN AERS: 1.0000 | INHALATION_SPRAY | Freq: Four times a day (QID) | RESPIRATORY_TRACT | Status: DC | PRN

## 2012-02-02 MED ORDER — INFLUENZA VIRUS VACC SPLIT PF IM SUSP
0.5000 mL | INTRAMUSCULAR | Status: AC
  Administered 2012-02-02: 0.5 mL via INTRAMUSCULAR
  Filled 2012-02-02: qty 0.5

## 2012-02-02 MED ORDER — PNEUMOCOCCAL VAC POLYVALENT 25 MCG/0.5ML IJ INJ: 0.5000 mL | INJECTION | INTRAMUSCULAR | Status: DC

## 2012-02-02 MED ORDER — INFLUENZA VIRUS VACC SPLIT PF IM SUSP: 0.5000 mL | INTRAMUSCULAR | Status: DC

## 2012-02-02 MED ORDER — INSULIN ASPART 100 UNIT/ML ~~LOC~~ SOLN
0.0000 [IU] | Freq: Three times a day (TID) | SUBCUTANEOUS | Status: DC
  Administered 2012-02-02: 11 [IU] via SUBCUTANEOUS

## 2012-02-02 MED ORDER — ASPIRIN 81 MG PO TBEC: 81.0000 mg | DELAYED_RELEASE_TABLET | Freq: Every day | ORAL | Status: DC

## 2012-02-02 MED ORDER — GLUCOSE BLOOD VI STRP: ORAL_STRIP | Status: DC

## 2012-02-02 MED ORDER — INSULIN ASPART 100 UNIT/ML ~~LOC~~ SOLN: 0.0000 [IU] | Freq: Every day | SUBCUTANEOUS | Status: DC

## 2012-02-02 MED ORDER — ALBUTEROL SULFATE HFA 108 (90 BASE) MCG/ACT IN AERS
1.0000 | INHALATION_SPRAY | RESPIRATORY_TRACT | Status: DC | PRN
  Administered 2012-02-02: 2 via RESPIRATORY_TRACT
  Filled 2012-02-02: qty 6.7

## 2012-02-02 MED ORDER — BENZONATATE 100 MG PO CAPS: 100.0000 mg | ORAL_CAPSULE | Freq: Three times a day (TID) | ORAL | Status: DC | PRN

## 2012-02-02 MED ORDER — GUAIFENESIN-CODEINE 100-10 MG/5ML PO SOLN
5.0000 mL | Freq: Once | ORAL | Status: AC
  Administered 2012-02-02: 5 mL via ORAL
  Filled 2012-02-02: qty 5

## 2012-02-02 MED ORDER — METFORMIN HCL 1000 MG PO TABS: 1000.0000 mg | ORAL_TABLET | Freq: Two times a day (BID) | ORAL | Status: DC

## 2012-02-02 MED ORDER — LOSARTAN POTASSIUM 25 MG PO TABS
25.0000 mg | ORAL_TABLET | Freq: Every day | ORAL | Status: DC
  Administered 2012-02-02: 25 mg via ORAL
  Filled 2012-02-02: qty 1

## 2012-02-02 MED ORDER — INSULIN GLARGINE 100 UNIT/ML ~~LOC~~ SOLN: 15.0000 [IU] | Freq: Every day | SUBCUTANEOUS | Status: DC

## 2012-02-02 MED ORDER — LANCETS MISC: Status: DC

## 2012-02-02 MED ORDER — ATENOLOL 50 MG PO TABS: 50.0000 mg | ORAL_TABLET | Freq: Every day | ORAL | Status: DC

## 2012-02-02 MED ORDER — PRAVASTATIN SODIUM 80 MG PO TABS: 80.0000 mg | ORAL_TABLET | Freq: Every day | ORAL | Status: DC

## 2012-02-02 NOTE — Discharge Summary (Signed)
Patient Name:  Terry Parks MRN: [id number]  PCP: Farley Ly, MD DOB:  1955/09/21       Date of Admission:  01/31/2012  Date of Discharge:  02/02/2012      Attending Physician: Inez Catalina, MD         DISCHARGE DIAGNOSES: 1.   Bronchitis:  Based on cough and radiographic findings. Provided albuterol and benzonatate. 2.   Chest pain:  Hypertensive cardiomyopathy. MyoView without inducible ischemia. 3.   Diabetes:  A1c 14. Was on Lantus 12U and metformin.  Restarted at 15U plus metformin. 4.   Hypertension:  Well controlled here once on atenolol and losartan. 5.   Dyslipidemia:  LDL 182. Started on 40mg  simvastatin. 6.   OSA:  CPAP. 7.   BPH:  Historical diagnosis.  No symptoms despite no treatment.   DISPOSITION AND FOLLOW-UP: Terry Parks is to follow-up with the listed providers as detailed below, at which time, the following should be addressed:   1. Follow-up visits: 1. DIABETES COORDINATOR:  Patient restarted on Lantus 15U daily and metformin 1000mg  BID. 2. INTERNAL MEDICINE: 1. SOCIAL:  Address medication compliance and financial assistance.  Can he qualify for orange card and can he afford his medications. 2. COUGH:  CXR suggested bronchitis.  Given rx for benzonatate and given and albuterol inhaler.  Could also be cough secondary to ACE-I.  Discharged on losartan rather than lisinopril, but if we think this is bronchitis, we could switch back to lisinopril for financial considerations. 3. BPH:  Asymptomatic despite not taking tamsulosin.  We did not discharge him on any treatments. 4. CARDIOLOGY:  "Most likely has hypertensive cardiomyopathy due to uncontrolled BP. No evidence of ischemia. I don't think he has had an MI.  He will need treatment with ACE/beta blocker and excellent BP control. May consider adding spironolactone. Also hydralazine if BP does not come under control." -Dr. Donnie Aho   Follow-up Information    Follow up with Plyler, Lupita Leash, RD. On  02/10/2012. (DIABETES COORDINATOR - Your appointment is at 9:30 on Thursday, September 26.)    Contact information:   [address] [place] [address] [phone]       Follow up with PRIBULA,CHRISTOPHER, MD. On 02/10/2012. (INTERNAL MEDICINE - Your appointment is at 10:30 on Thursday, September 26.)    Contact information:   [address] [place] [address] [phone]         Discharge Orders    Future Appointments: Provider: Department: Dept Phone: Center:   02/10/2012 9:30 AM Cecil Cranker Plyler, RD [facility] [phone] [facility]   02/10/2012 10:30 AM Leodis Sias, MD [facility] [phone] [facility]     Future Orders Please Complete By Expires   Diet - low sodium heart healthy      Increase activity slowly      Call MD for:  difficulty breathing, headache or visual disturbances      Call MD for:  temperature >100.4          DISCHARGE MEDICATIONS:   Medication List     As of 02/02/2012  1:54 PM    STOP taking these medications         FLOMAX 0.4 MG Caps   Generic drug: Tamsulosin HCl      omeprazole 20 MG capsule   Commonly known as: PRILOSEC      quinapril 10 MG tablet   Commonly known as: ACCUPRIL      TAKE these  medications         albuterol 108 (90 BASE) MCG/ACT inhaler   Commonly known as: PROVENTIL HFA;VENTOLIN HFA   Inhale 1-2 puffs into the lungs every 6 (six) hours as needed for wheezing or shortness of breath.      aspirin 81 MG EC tablet   Take 1 tablet (81 mg total) by mouth daily.      atenolol 50 MG tablet   Commonly known as: TENORMIN   Take 1 tablet (50 mg total) by mouth daily.      benzonatate 100 MG capsule   Commonly known as: TESSALON   Take 1 capsule (100 mg total) by mouth 3 (three) times daily as needed for cough.      glucose blood test strip   Use as directed to test blood sugar three times a day before meals.      insulin glargine 100 UNIT/ML injection   Commonly known as: LANTUS    Inject 15 Units into the skin at bedtime.      Lancets Misc   Use to test blood sugar three times a day before meals.      losartan 25 MG tablet   Commonly known as: COZAAR   Take 1 tablet (25 mg total) by mouth daily.      metFORMIN 1000 MG tablet   Commonly known as: GLUCOPHAGE   Take 1 tablet (1,000 mg total) by mouth 2 (two) times daily.      pravastatin 80 MG tablet   Commonly known as: PRAVACHOL   Take 1 tablet (80 mg total) by mouth daily.         CONSULTS:  1.   Cardiology   PROCEDURES PERFORMED:  Dg Chest 2 View 02/02/2012   FINDINGS: The cardiac silhouette, mediastinal and hilar contours are stable.  Persistent peribronchial thickening, increased interstitial markings and small bilateral pleural effusions. Suspect severe bronchitis or interstitial pneumonitis.  No focal airspace consolidation or pneumothorax.  The bony thorax is intact. IMPRESSION: Peribronchial thickening, increased interstitial markings, streaky areas of atelectasis and small effusions likely due to severe bronchitis or interstitial pneumonitis.  Dg Chest 2 View 01/31/2012  FINDINGS: Decreased lung volumes. Enlargement of cardiac silhouette. Pulmonary vascular ingestion. Bibasilar atelectasis. Questionable nodular density versus prominent vascular marking in right upper lobe, 7 mm diameter. No pleural effusion or pneumothorax. IMPRESSION: Enlargement of cardiac silhouette. Decreased lung volumes with bibasilar atelectasis. Questionable nodular density versus prominent vascular marking right upper lobe; either CT chest to exclude pulmonary nodule or follow-up radiographs to ensure resolution recommended.  Nm Myocar Multi W/spect W/wall Motion / Ef 02/02/2012 FINDINGS:  Diaphragmatic attenuation is present.  There is also apical septal thinning with decreased radiotracer uptake in this region.  This correlates with the prior CT of 11/04/2004.  There is a small focal mid anterior wall fixed defect on the  rest and stress images compatible with a small infarction.  There is no peri infarct ischemia.  No reversible defects are identified.  The lateral wall is relatively hypokinetic although there is no perfusion abnormality in this region. Ejection fraction is 42% with end-diastolic volume of 171 ml and end-systolic volume of 99 ml. IMPRESSION: 1.  No reversible ischemia.  Small mid anterior wall fixed defect/infarct. 2.  Ejection fraction 42%.    2D Echocardiogram 02/01/2012 LEFT VENTRICLE:  Normal cavity size. LVH pattern. EF 40% to 45%. Diffuse hypokinesis. MITRAL VALVE:  Mild regurgitation. LEFT ATRIUM:  Moderately to severely dilated.  ADMISSION DATA: H&P: This is  the 56 year old old pleasant gentleman with past medical history significant for hypertension, hyperlipidemia and diabetes mellitus who presented to the ED per family request. Patient reports that he was complaining about chest pain and shortness of breath as well as high blood sugars and therefore the family requested to be evaluated in the ED. Patient noted that he has not been taking his medication since almost one year since he has no insurance.  Patient's chest pain is occasionally sharp but most of the time a dull and aching pain, localized in the substernal area and moving into his left arm and neck/shoulder area. He just feels somewhat numb and tingling at times in the left arm. This has been going on for at least 3 months. The pain can come on during rest or with exertion but is resolved with rest. It is further associated with some shortness of breath with mild nonproductive cough. During the episode of the pain he just does not feel right. Apart from the shortness of breath he denies any dizziness, nausea, abdominal pain. He has not seek medical help. Has not taken any medication over-the-counter to relieve the pain.  Patient further endorses thirst and is somewhat not able to keep up with his fluid intake he sees. Even after  drinking a lot of fluids he still feels very thirsty. He noted that he has no increased urinary frequency but mentioned urinary hesitance and trouble to initiate. He thinks he has a large prostate and someone gave him something for the medication but he is not completely sure. He reports he had a colonoscopy done and everything was normal in the past.  The patient denies any sick contact but reports that he traveled on the train to the [facility] from Thursday to Saturday.   Physical Exam: Vitals: Blood pressure 156/97, pulse 98, temperature 98.3 F (36.8 C), temperature source Oral, resp. rate 18, SpO2 95.00%.  Constitutional: Vital signs reviewed. Patient is a obese male in no acute distress and cooperative with exam. Alert and oriented x3.  Head: Normocephalic and atraumatic  Mouth: no erythema or exudates, MMM  Eyes: PERRL, EOMI, conjunctivae normal, No scleral icterus.  Neck: Supple,  Cardiovascular: Tachycardic buy regular , S1 normal, S2 normal, pulses symmetric and intact bilaterally  Pulmonary/Chest: CTAB, no wheezes, rales, or rhonchi  Abdominal: Soft. Non-tender, non-distended, bowel sounds are normal,  Neurological: A&O x3, Strenght is normal and symmetric bilaterally, no focal motor deficit, sensory intact to light touch bilaterally.  Skin: Warm, dry and intact. No rash, cyanosis, or clubbing.   Labs: Basic Metabolic Panel:  [facility]  01/31/12 0849   NA  134*   K  4.0   CL  99   CO2  26   GLUCOSE  389*   BUN  8   CREATININE  0.84   CALCIUM  8.9   MG  --   PHOS  --    LFT 06/2010  Alkaline phosphatase 64  Albumin 4  AST 16  ALT 23  Total protein 7.2  Total bili 0.4   CBC:  Basename  01/31/12 0849   WBC  6.3   NEUTROABS  3.3   HGB  14.1   HCT  41.6   MCV  79.8   PLT  140*    Cardiac Enzymes:  Basename  01/31/12 0840   CKTOTAL  --   CKMB  --   CKMBINDEX  --   TROPONINI  <0.30    CBG:  Basename  01/31/12 0910  GLUCAP  338*     HOSPITAL  COURSE: 1.   Bronchitis:  Dry, non-productive cough. Denies fevers, chills, and dyspnea; and pt is oxygenating well, so this is unlikely to represent pneumonia. Exam is not consistent with bronchitis, but CXR demonstrates peribronchial thickening.  Rales are concerning for CHF, but lower extremities are without edema and the patient's weight is actually down today from yesterday. This is probably acute bronchitis or a reaction to lisinopril.  We switched to losartan and discharged him on this instead of lisinopril.  We also provided him with an albuterol inhaler and a prescription for benzonatate pearls.  If cough resolves, and it is thought to be bronchitis, losartan can be switched back to lisinopril if the cost of losartan is an issue.  2.   Chest pain:  Patient has intermittent chest pain which is present with rest and at exertion along with radiation to left arm and neck area as well as shortness of breath. Patient has significant risk factors including hypertension , diabetes, obesity and hyperlipidemia with noncompliance. Cardiac enzymes, EKG, and exercise stress test are all reassuring. Echocardiography demonstrated a reduced EF at 40% to 45% and a LVH pattern. Global hypokinesis but no wall motion abnormality. MyoView did not demonstrate any inducible ischemia. Chest x-ray is negative for pneumonia, pneumothorax, aortic dissection but is notable for an enlarged cardiac silhouette. PE is a possibility considering patient's tachycardia and Geneva score of 5 which places the patient in intermediate risk but the Well's score indicates PE unlikely with a score of 1.5. TSH was slightly low at 0.289 but free T4 was normal at 1.01. Cardiology suspects hypertensive cardiomyopathy and recommends aggressive blood pressure control with ACE-I/ARB and beta blocker; we started both of these.  They also suggested consideration of spironolactone as an adjunctive treatment.    3.   Diabetes:  Last hemoglobin A1c was 6.6  in 06/2010 but patient has not been taking his medication for over a year. A1c now 14. Per records patient should have been on Lantus 12 units and metformin 1000 mg twice a day. Urine negative for protein.  Initially started on Lantus 10U here but discharged on 15U daily.  We also restarted metformin 1000mg  BID at discharge.  4.   Hypertension:  Blood pressure elevated at presentation. Restarted on atenolol and lisinopril, which provided adequate control.  We opted to discharge on losartan 25mg  rather than lisinopril because of the cough.  Atenolol is dosed at 50mg  daily.  5.   Dyslipidemia:  Elevated LDL, TGY, and total cholesterol on FLP; HDL low. LDL is 182. Started on 40mg  simvastatin.   6.   OSA:  CPAP here.  Should be on this at home too.  7.   BPH:  Historical diagnosis.  No symptoms despite medical noncompliance.  We did not prescribe any medications.   DISCHARGE DATA: Vital Signs: BP 138/88  Pulse 99  Temp 98.5 F (36.9 C) (Oral)  Resp 20  Ht 6\' 1"  (1.854 m)  Wt 293 lb 6.4 oz (133.085 kg)  BMI 38.71 kg/m2  SpO2 99%  Labs: Results for orders placed during the hospital encounter of 01/31/12 (from the past 24 hour(s))  GLUCOSE, CAPILLARY     Status: Abnormal   Collection Time   02/01/12  4:18 PM      Component Value Range   Glucose-Capillary 431 (*) 70 - 99 mg/dL  GLUCOSE, CAPILLARY     Status: Abnormal   Collection Time   02/01/12  8:27 PM  Component Value Range   Glucose-Capillary 192 (*) 70 - 99 mg/dL  GLUCOSE, CAPILLARY     Status: Abnormal   Collection Time   02/02/12  7:14 AM      Component Value Range   Glucose-Capillary 249 (*) 70 - 99 mg/dL  GLUCOSE, CAPILLARY     Status: Abnormal   Collection Time   02/02/12 11:31 AM      Component Value Range   Glucose-Capillary 279 (*) 70 - 99 mg/dL     Time spent on discharge: 35 minutes   Signed by:  Dorthula Rue. Earlene Plater, MD PGY-I, Internal Medicine  02/03/2012, 1:32 PM

## 2012-02-02 NOTE — Progress Notes (Signed)
Subjective:  No Sob or chest pain.  Objective:  Vital Signs in the last 24 hours: BP 138/88  Pulse 99  Temp 98.5 F (36.9 C) (Oral)  Resp 20  Ht 6\' 1"  (1.854 m)  Wt 133.085 kg (293 lb 6.4 oz)  BMI 38.71 kg/m2  SpO2 99%  Physical Exam: Very large BM in NAD Lungs:  Clear  Cardiac:  Regular rhythm, normal S1 and S2, no S3 Extremities:  No edema present  Intake/Output from previous day: 09/17 0701 - 09/18 0700 In: 183 [P.O.:180; I.V.:3] Out: -  Weight Filed Weights   01/31/12 1400 02/01/12 0500 02/02/12 0500  Weight: 131.362 kg (289 lb 9.6 oz) 136.1 kg (300 lb 0.7 oz) 133.085 kg (293 lb 6.4 oz)    Lab Results: Basic Metabolic Panel:  Basename 02/01/12 0511 01/31/12 0849  NA 134* 134*  K 3.8 4.0  CL 102 99  CO2 23 26  GLUCOSE 329* 389*  BUN 14 8  CREATININE 0.90 0.84    CBC:  Basename 01/31/12 0849  WBC 6.3  NEUTROABS 3.3  HGB 14.1  HCT 41.6  MCV 79.8  PLT 140*   Telemetry: Sinus rhythm  Radiology  Cardiolite shows EF 42% with global hypokinesis  Assessment/Plan: 1. Most likely has hypertensive cardiomyopathy due to uncontrolled BP.  No evidence of ischemia.  I don't think he has had an MI.  He will need treatment with ACE/beta blocker and excellent BP control.  May consider adding spironolactone.  Also hydralazine if BP does not come under control  Importance of compliance discussed with him.  He will need ECHO followed up annually.      Darden Palmer  MD [facility] Cardiology  02/02/2012, 1:57 PM

## 2012-02-02 NOTE — Progress Notes (Signed)
Inpatient Diabetes Program Recommendations  AACE/ADA: New Consensus Statement on Inpatient Glycemic Control (2013)  Target Ranges:  Prepandial:   less than 140 mg/dL      Peak postprandial:   less than 180 mg/dL (1-2 hours)      Critically ill patients:  140 - 180 mg/dL   Results for YUYA, DELLIS (MRN [id number]) as of 02/02/2012 11:57  Ref. Range 02/01/2012 07:23 02/01/2012 16:18 02/01/2012 20:27  Glucose-Capillary Latest Range: 70-99 mg/dL 409 (H) 811 (H) 914 (H)   Results for FENIX, WILCKEN (MRN [id number]) as of 02/02/2012 11:57  Ref. Range 02/02/2012 07:14 02/02/2012 11:31  Glucose-Capillary Latest Range: 70-99 mg/dL 086 (H) 578 (H)   Fasting CBG elevated today.  Postprandial CBGs also elevated.  Inpatient Diabetes Program Recommendations Insulin - Basal: Please consider increasing Lantus to 15 units QHS. Insulin - Meal Coverage: Please consider adding meal coverage- Novolog 4 units tid with meals.  Note: Will follow. Ambrose Finland RN, MSN, CDE Diabetes Coordinator Inpatient Diabetes Program [phone]

## 2012-02-02 NOTE — Care Management Note (Signed)
    Page 1 of 1   02/02/2012     3:23:44 PM   CARE MANAGEMENT NOTE 02/02/2012  Patient:  Terry Parks, Terry Parks   Account Number:  [id number]  Date Initiated:  02/02/2012  Documentation initiated by:  GRAVES-BIGELOW,Gertrue Willette  Subjective/Objective Assessment:   CM did speak to pt and he is from home alone  and has support of friends. Pt was admitted for cp. Pt states he still works and has income to pay for meds via [employer]. He is aware of 4.00 med list and aware of meds at [facility].     Action/Plan:   Pt states he has assistance with orange card from [facility]. Pt uses lantus pen. We offered to give him a lantus vial and pt is afraid of syringes. No further needs from CM at this time.   Anticipated DC Date:  02/02/2012   Anticipated DC Plan:  HOME/SELF CARE         Choice offered to / List presented to:             Status of service:  Completed, signed off Medicare Important Message given?   (If response is "NO", the following Medicare IM given date fields will be blank) Date Medicare IM given:   Date Additional Medicare IM given:    Discharge Disposition:  HOME/SELF CARE  Per UR Regulation:  Reviewed for med. necessity/level of care/duration of stay  If discussed at Long Length of Stay Meetings, dates discussed:    Comments:

## 2012-02-02 NOTE — Progress Notes (Signed)
Pt discharged to home per MD order. Pt received and reviewed all discharge instructions including follow-up appointments and medication information, including prescription information.  Pt spoke with RN and CM prior to discharge regarding the importance of and pt ability to obtain and take medication as prescribed.  Pt states he is able to obtain his prescriptions.  Pt alert and oriented at discharge with no complaints of pain.  Pt remains slightly short of breath and continues to have non-productive cough.  MD aware.  Pt escorted to private vehicle via wheelchair by nursing student.  Terry Parks

## 2012-02-02 NOTE — Progress Notes (Signed)
Subjective:    Interval Events:  [name] has a worsening cough.  It is non-productive.  It has worsened since starting medications here.  He denies dyspnea, fevers, and chills.  He also denies recurrence of chest discomfort.     Objective:    Vital Signs:   Temp:  [97.5 F (36.4 C)-98.3 F (36.8 C)] 97.5 F (36.4 C) (09/18 0500) Pulse Rate:  [88-93] 93  (09/18 0500) Resp:  [18-24] 20  (09/18 0500) BP: (115-145)/(73-98) 115/73 mmHg (09/18 0500) SpO2:  [95 %-100 %] 95 % (09/18 0500) Weight:  [293 lb 6.4 oz (133.085 kg)] 293 lb 6.4 oz (133.085 kg) (09/18 0500) Last BM Date: 02/01/12   Weights: 24-hour Weight change: 13 lb 6.4 oz (6.078 kg)  Filed Weights   01/31/12 1400 02/01/12 0500 02/02/12 0500  Weight: 289 lb 9.6 oz (131.362 kg) 300 lb 0.7 oz (136.1 kg) 293 lb 6.4 oz (133.085 kg)     Intake/Output:   Intake/Output Summary (Last 24 hours) at 02/02/12 0826 Last data filed at 02/01/12 1430  Gross per 24 hour  Intake    183 ml  Output      0 ml  Net    183 ml       Physical Exam: General appearance: alert, cooperative and no distress Resp: wheezes bibasilar Cardio: regular rate and rhythm Extremities: extremities normal, atraumatic, no cyanosis or edema    Labs: Basic Metabolic Panel:  Lab 02/01/12 9811 01/31/12 1056 01/31/12 0849  NA 134* -- 134*  K 3.8 -- 4.0  CL 102 -- 99  CO2 23 -- 26  GLUCOSE 329* -- 389*  BUN 14 -- 8  CREATININE 0.90 -- 0.84  CALCIUM 8.7 -- 8.9  MG -- 1.8 --  PHOS -- -- --   CBG:  Lab 02/02/12 0714 02/01/12 2027 02/01/12 1618 02/01/12 0723 01/31/12 2101  GLUCAP 249* 192* 431* 290* 269*     Medications:     Scheduled Medications:    . aspirin EC  81 mg Oral Daily  . atenolol  50 mg Oral Daily  . enoxaparin (LOVENOX) injection  60 mg Subcutaneous Q24H  . guaiFENesin-codeine  5 mL Oral Once  . guaiFENesin-codeine  5 mL Oral Once  . guaiFENesin-codeine  5 mL Oral Once  . influenza  inactive virus vaccine  0.5 mL  Intramuscular Tomorrow-1000  . insulin aspart  0-15 Units Subcutaneous TID WC  . insulin aspart  0-5 Units Subcutaneous QHS  . insulin glargine  10 Units Subcutaneous QHS  . losartan  25 mg Oral Daily  . pneumococcal 23 valent vaccine  0.5 mL Intramuscular Tomorrow-1000  . regadenoson  0.4 mg Intravenous Once  . simvastatin  20 mg Oral q1800  . sodium chloride  3 mL Intravenous Q12H  . DISCONTD: lisinopril  10 mg Oral Daily  . DISCONTD: Tamsulosin HCl  0.8 mg Oral QHS     PRN Medications: acetaminophen, benzonatate, morphine injection, nitroGLYCERIN, technetium sestamibi, technetium sestamibi generic   Assessment/ Plan:    1.   Cough:  Dry, non-productive cough.  Denies fevers, chills, and dyspnea; and pt is oxygenating well, so this is unlikely to represent pneumonia.  Exam is not consistent with bronchitis.  Rales are concerning for CHF, but lower extremities are without edema and the patient's weight is actually down today from yesterday. This is probably related to ACE-I use. - CXR - Switched lisinopril 10 to losartan 25  2.   Chest pain concerning for ACS:  Patient has intermittent chest pain which is present with rest and at exertion along with radiation to left arm and neck area as well as shortness of breath. Patient has significant risk factors including hypertension , diabetes, obesity and hyperlipidemia with noncompliance. Cardiac enzymes, EKG, and exercise stress test are all reassuring.  Echocardiography demonstrated a reduced EF at 40% to 45% and a LVH pattern. Global hypokinesis but no wall motion abnormality. Chest x-ray is negative for pneumonia, pneumothorax, aortic dissection but is notable for an enlarged cardiac silhouette. PE is a possibility considering patient's tachycardia and Geneva score of 5 which places the patient in intermediate risk but the Well's score indicates PE unlikely with a score of 1.5. TSH was slightly low at 0.289 but free T4 was normal at 1.01.  -  MyoView results pending  - Continue aspirin 81 mg, atenolol 50 mg, losartan 25 mg and simvastatin 20 mg   3.   Diabetes mellitus:  Last hemoglobin A1c was 6.6 in 06/2010 but patient has not been taking his medication since over one year. A1c now 14. Per records patient should have been on Lantus 12 units and metformin 1000 mg twice a day. Urine negative for protein.  - On 10U Lantus here and correctional insulin, increased scale to resistant this AM  4.   Hypertension:  Blood pressure elevated at presentation. Restarted on atenolol and lisinopril.  Now improved.  - Continue atenolol 50 and losartan 25  5.   Dyslipidemia:  Elevated LDL, TGY, and total cholesterol on FLP; HDL low. LDL is 182. Started on 20mg  simvastatin.  - Discharge home on 40mg  simvastatin  6.   OSA:  CPAP here.  7.   Hx of BPH:  Patient denies symptoms. Will stop FloMax. Consider treatment as outpatient if symptoms surface.  8.   Questionable nodular density versus prominent vascular marking on right upper lobe per CXray .  - Recommendation per Radiology either CT chest to exclude pulmonary nodule or follow-up radiographs to ensure resolution recommended.   9.   Prophylaxis: Enoxaparin  10. Disposition: Pending MyoView   Length of Stay: 2 days   Signed by:  Dorthula Rue. Earlene Plater, MD PGY-I, Internal Medicine Pager [phone]  02/02/2012, 8:26 AM

## 2012-02-10 ENCOUNTER — Ambulatory Visit (INDEPENDENT_AMBULATORY_CARE_PROVIDER_SITE_OTHER): Payer: Self-pay | Admitting: Dietician

## 2012-02-10 ENCOUNTER — Encounter: Payer: Self-pay | Admitting: Dietician

## 2012-02-10 ENCOUNTER — Ambulatory Visit (INDEPENDENT_AMBULATORY_CARE_PROVIDER_SITE_OTHER): Payer: Self-pay | Admitting: Internal Medicine

## 2012-02-10 ENCOUNTER — Encounter: Payer: Self-pay | Admitting: Internal Medicine

## 2012-02-10 VITALS — Ht 73.0 in | Wt 297.1 lb

## 2012-02-10 VITALS — BP 137/100 | HR 91 | Temp 97.2°F | Ht 73.0 in | Wt 296.8 lb

## 2012-02-10 DIAGNOSIS — E11319 Type 2 diabetes mellitus with unspecified diabetic retinopathy without macular edema: Secondary | ICD-10-CM

## 2012-02-10 DIAGNOSIS — J209 Acute bronchitis, unspecified: Secondary | ICD-10-CM

## 2012-02-10 DIAGNOSIS — E1139 Type 2 diabetes mellitus with other diabetic ophthalmic complication: Secondary | ICD-10-CM

## 2012-02-10 DIAGNOSIS — R0602 Shortness of breath: Secondary | ICD-10-CM

## 2012-02-10 DIAGNOSIS — I119 Hypertensive heart disease without heart failure: Secondary | ICD-10-CM

## 2012-02-10 MED ORDER — BENZONATATE 100 MG PO CAPS: 100.0000 mg | ORAL_CAPSULE | Freq: Three times a day (TID) | ORAL | Status: DC | PRN

## 2012-02-10 MED ORDER — LOSARTAN POTASSIUM 25 MG PO TABS: 25.0000 mg | ORAL_TABLET | Freq: Every day | ORAL | Status: DC

## 2012-02-10 MED ORDER — CHLORPHENIRAMINE MALEATE 4 MG PO TABS: 4.0000 mg | ORAL_TABLET | Freq: Two times a day (BID) | ORAL | Status: DC | PRN

## 2012-02-10 NOTE — Patient Instructions (Signed)
1.  Continue the Benzonatate up to 3 times daily as needed for the cough.  2.  Start the Chlorpheniramine 4 mg tablets.  Take 1 tablet twice daily for the cough and the post nasal drip.  3.  This will get better slowly over the next week or so.  If it does not please come back for [place] to reassess.  4.  Follow up with your primary care doctor in about 4 weeks.

## 2012-02-10 NOTE — Progress Notes (Signed)
Subjective:   Patient ID: Terry Parks male   DOB: [birth year] 56 y.o.   MRN: [id number]  HPI: [name] is a 56 y.o. man who presents to clinic today for follow up from his hospitalization.  He was hospitalized for chest pain due to bronchitis and was also found to have hypertensive heart disease with a decreased EF on Myoview and Echo.  He states that since leaving the hospital he has continued to have problems with coughing though he is not having any production.  He states that the tessalon has helped a lot.  He has also been taking Mucinex D because of nasal congestion, sinus pressure, and the continued cough.  He denies fevers, chills, nausea, vomiting, or chest pain.  He continues to have mild SOB that comes and goes.  He states that the albuterol does not help this.    He was found during the hospitalization to have an EF of 40-45% with diffuse hypokinesis on echo.  Myoview stress test showed no inducible ischemia and he was restarted on his blood pressure medications.  He has no primary insurance at this time and has been working to get his [employer] card.  He states that he has been taking his medications and is working to get his Lantus through the patient assistance programs.    Past Medical History  Diagnosis Date  . Diabetes mellitus 04/08/2008  . Hypertension   . Hyperlipemia   . Obstructive sleep apnea 03/06/2008    Sleep study 03/06/08 showed severe OSA/hypopnea syndrome, with successful CPAP titration to 13 CWP using a medium ResMed Mirage Quattro full face mask with heated humidifier.   . Cardiomegaly     Mild by CXR 11/04/04  . BPH (benign prostatic hypertrophy)     Massive BPH noted on cystoscopy 1/23/ 2012 by Dr. Isabel Caprice.  . Renal calculus 05/29/2010    CT scan of abdomen/pelvis on 05/29/2010 showed an obstructing approximate 1-2 mm calculus at the left UVJ, and an approximate 1-2 mm left lower pole renal calculus.   Patient had continuing severe pain , and an  elevation of his serum creatinine to a value of 1.75 on 06/06/2010.  The stone had apparently passed and was not seen on repeat CT 06/08/2010.  [name] Headache   . Allergic rhinitis   . Neck pain   . Abscess of groin   . Abscessed tooth    Current Outpatient Prescriptions  Medication Sig Dispense Refill  . albuterol (PROVENTIL HFA;VENTOLIN HFA) 108 (90 BASE) MCG/ACT inhaler Inhale 1-2 puffs into the lungs every 6 (six) hours as needed for wheezing or shortness of breath.      [name] aspirin EC 81 MG EC tablet Take 1 tablet (81 mg total) by mouth daily.      [name] atenolol (TENORMIN) 50 MG tablet Take 1 tablet (50 mg total) by mouth daily.  30 tablet  0  . benzonatate (TESSALON) 100 MG capsule Take 1 capsule (100 mg total) by mouth 3 (three) times daily as needed for cough.  14 capsule  0  . glucose blood (TRUETRACK TEST) test strip Use as directed to test blood sugar three times a day before meals.  100 each  0  . insulin glargine (LANTUS SOLOSTAR) 100 UNIT/ML injection Inject 15 Units into the skin at bedtime.  10 mL  0  . Lancets MISC Use to test blood sugar three times a day before meals.  100 each  0  . losartan (COZAAR) 25 MG tablet Take 1  tablet (25 mg total) by mouth daily.  30 tablet  0  . metFORMIN (GLUCOPHAGE) 1000 MG tablet Take 1 tablet (1,000 mg total) by mouth 2 (two) times daily.  60 tablet  0  . pravastatin (PRAVACHOL) 80 MG tablet Take 1 tablet (80 mg total) by mouth daily.  30 tablet  0   Family History  Problem Relation Age of Onset  . Breast cancer Mother   . Colon cancer Neg Hx   . Prostate cancer Neg Hx   . Heart attack Neg Hx   . Hypertension Father   . Diabetes Maternal Grandmother    History   Social History  . Marital Status: Single    Spouse Name: N/A    Number of Children: N/A  . Years of Education: N/A   Social History Main Topics  . Smoking status: Never Smoker   . Smokeless tobacco: Never Used  . Alcohol Use: No  . Drug Use: No  . Sexually Active: None    Other Topics Concern  . None   Social History Narrative   Divorced, 4 children, lives alone.  Works for [employer].   Review of Systems: Full 12 system review of system was negative except as noted in the HPI.   Objective:  Physical Exam: Filed Vitals:   02/10/12 1039  BP: 137/100  Pulse: 91  Temp: 97.2 F (36.2 C)  TempSrc: Oral  Height: 6\' 1"  (1.854 m)  Weight: 296 lb 12.8 oz (134.628 kg)  SpO2: 100%   Constitutional: Vital signs reviewed.  Patient is a well-developed and well-nourished man in no acute distress and cooperative with exam. Alert and oriented x3.  Head: Normocephalic and atraumatic Ear: TM normal bilaterally Mouth: no erythema or exudates, MMM Eyes: PERRL, EOMI, conjunctivae normal, No scleral icterus.  Neck: Supple, Trachea midline normal ROM, No JVD, mass, thyromegaly, or carotid bruit present.  Cardiovascular: RRR, S1 normal, S2 normal, no MRG, pulses symmetric and intact bilaterally Pulmonary/Chest: mild end expiratory wheezes noted otherwise CTAB, no rales, or rhonchi Abdominal: Soft. Non-tender, non-distended, bowel sounds are normal, no masses, organomegaly, or guarding present.  GU: no CVA tenderness Musculoskeletal: No joint deformities, erythema, or stiffness, ROM full and no nontender Hematology: no cervical, inginal, or axillary adenopathy.  Neurological: A&O x3, Strength is normal and symmetric bilaterally, cranial nerve II-XII are grossly intact, no focal motor deficit, sensory intact to light touch bilaterally.  Skin: Warm, dry and intact. No rash, cyanosis, or clubbing.  Psychiatric: Normal mood and affect. speech and behavior is normal. Judgment and thought content normal. Cognition and memory are normal.   Assessment & Plan:

## 2012-02-10 NOTE — Progress Notes (Signed)
Diabetes Self-Management Training (DSMT)  Initial Visit ( previous visits 11/09, 12/09, 10/10, 11/10)  02/10/2012 [name], identified by name and date of birth, is a 56 y.o. male with Type 2 Diabetes. Year of diabetes diagnosis: 2009  ASSESSMENT Patient concerns are Problem solving and Support.  Height 6\' 1"  (1.854 m), weight 297 lb 1.6 oz (134.764 kg). Body mass index is 39.20 kg/(m^2). Lab Results  Component Value Date   LDLCALC 182* 01/31/2012   Lab Results  Component Value Date   HGBA1C 14.2* 01/31/2012   Labs reviewed. Family history of diabetes: Yes Support systems: friends Special needs: None Prior DM Education: Yes Patients belief/attitude about diabetes: Diabetes can be controlled. Self foot exams daily: Yes Diabetes Complications: None   Medications See Medications list.  Has adequate knowledge    Exercise Plan Doing ADLs Intends to walk and work out- think about about joining [employer].   .   Self-Monitoring Monitor: True track Frequency of testing: 1-2 times/day Breakfast: 200s Hyperglycemia: Yes Daily Hypoglycemia: No   Meal Planning Some knowledge- difficulty knowing what foods contain carbs, identifies carb as "white foods" as having carbs.  Assessment comments: patient reports financial stress and overbearing friend support as barriers to self care of his diabetes.     INDIVIDUAL DIABETES EDUCATION PLAN:  Goal setting Psychosocial adjustment _______________________________________________________________________  Intervention TOPICS COVERED TODAY:  Goal setting  Helped patient develop diabetes management plan for assisting with finacial stress Psychosocial adjustment  Worked with patient to identify barriers to care and solutions Helped patient identify a support system for diabetes management Brainstormed with patient on coping mechanisms for social situations, getting support from significant others, dealing with feelings  about diabetes  PATIENTS GOALS/PLAN (copy and paste in patient instructions so patient receives a copy): 1.  Learning Objective:       importance of support to self care 2.  Behavioral Objective:         Healthy Coping: For help with diabetes control, I will ask for help with diabetes when others are bothering you or diabetes gets out of control  Sometimes 25%  Personalized Follow-Up Plan for Ongoing Self Management Support:  Doctor's Office and friends ______________________________________________________________________   Outcomes Expected outcomes: Demonstrated limited interest in learning. Expect minimal changes. Self-care Barriers: Lack of material resources Education material provided: yes Patient to contact team via Phone if problems or questions. Time in: 1000     Time out: 1030 Future DSMT - 4-6 wks by phone per patient  Parks, Terry Leash

## 2012-02-18 ENCOUNTER — Telehealth: Payer: Self-pay | Admitting: Dietician

## 2012-02-18 NOTE — Telephone Encounter (Signed)
Opened in error

## 2012-03-07 ENCOUNTER — Other Ambulatory Visit: Payer: Self-pay | Admitting: *Deleted

## 2012-03-07 ENCOUNTER — Telehealth: Payer: Self-pay | Admitting: Dietician

## 2012-03-07 DIAGNOSIS — J209 Acute bronchitis, unspecified: Secondary | ICD-10-CM

## 2012-03-07 MED ORDER — BENZONATATE 100 MG PO CAPS: 100.0000 mg | ORAL_CAPSULE | Freq: Three times a day (TID) | ORAL | Status: DC | PRN

## 2012-03-07 NOTE — Telephone Encounter (Signed)
Spoke with patient he says he is taking his medicines, walking, eating healthier. still thinking about joining [employer].  Doesn't feel need to ask for help with his diabetes because he has plenty of people checking on him and he considers this supportive. Agrees to schedule with Dr. Meredith Pel today. Call was transferred to front office.   Encouraged patient to call anytime he has questions or concerns about his diabetes emphasizing that he doesn't have to wait until he has an appointment or hears form [place].

## 2012-03-07 NOTE — Telephone Encounter (Signed)
Patient was busy at time of call- arranged return call this afternoon. Will remind patient he is due to see his PCP Need to follow up on: Eating health, taking meds for diabetes? Appointment with PCP- Goal set- ask for help with diabetes when others bothering your diabetes becomes unmanageable  ? Exercise join [employer]

## 2012-03-30 ENCOUNTER — Ambulatory Visit (INDEPENDENT_AMBULATORY_CARE_PROVIDER_SITE_OTHER): Payer: Self-pay | Admitting: Internal Medicine

## 2012-03-30 ENCOUNTER — Encounter: Payer: Self-pay | Admitting: Internal Medicine

## 2012-03-30 VITALS — BP 176/102 | HR 99 | Temp 97.4°F | Ht 73.0 in | Wt 294.6 lb

## 2012-03-30 DIAGNOSIS — E1139 Type 2 diabetes mellitus with other diabetic ophthalmic complication: Secondary | ICD-10-CM

## 2012-03-30 DIAGNOSIS — E11319 Type 2 diabetes mellitus with unspecified diabetic retinopathy without macular edema: Secondary | ICD-10-CM

## 2012-03-30 DIAGNOSIS — R059 Cough, unspecified: Secondary | ICD-10-CM

## 2012-03-30 DIAGNOSIS — R05 Cough: Secondary | ICD-10-CM

## 2012-03-30 DIAGNOSIS — I119 Hypertensive heart disease without heart failure: Secondary | ICD-10-CM

## 2012-03-30 HISTORY — DX: Cough, unspecified: R05.9

## 2012-03-30 MED ORDER — HYDROCODONE-HOMATROPINE 5-1.5 MG/5ML PO SYRP: 5.0000 mL | ORAL_SOLUTION | Freq: Four times a day (QID) | ORAL | Status: DC | PRN

## 2012-03-30 NOTE — Patient Instructions (Signed)
Please make a follow up appointment in 10-14 days. Continue taking all meds regularly. Start using Hycodan syrup every 6 hrs as needed for cough.

## 2012-03-30 NOTE — Assessment & Plan Note (Signed)
Blood pressure 176/102 today. This is the highest he ever had. No symptoms. Denies any chest pain, headache, vision changes. Reports eating something unusual yesterday and so his blood pressure is high. He would wait for next visit for adding a new blood pressure medication. I will see him back in 10-14 days to reevaluate.

## 2012-03-30 NOTE — Assessment & Plan Note (Signed)
Lab Results  Component Value Date   HGBA1C 14.2* 01/31/2012   HGBA1C 6.6 07/15/2010   CREATININE 0.90 02/01/2012   CREATININE 1.09 07/15/2010   MICROALBUR 3.71* 01/15/2010   MICRALBCREAT 14.8 01/15/2010   CHOL 246* 01/31/2012   HDL 31* 01/31/2012   TRIG 166* 01/31/2012    Last eye exam and foot exam:    Component Value Date/Time   HMDIABEYEEXA Mild non-proliferative diabetic retinopathy OU.  Exam by Liliane Bade. 04/15/2010   HMDIABFOOTEX Done. 04/15/2010    Assessment: Diabetes control: not controlled Progress toward goals: unchanged Barriers to meeting goals: nonadherence to medications  Plan: Diabetes treatment: continue current medications. Continue Lantus 15 units at bedtime and metformin 1000 mg twice daily. Not due for A1c check Refer to: none Instruction/counseling given: reminded to bring blood glucose meter & log to each visit, discussed the need for weight loss and discussed diet

## 2012-03-30 NOTE — Assessment & Plan Note (Signed)
Unclear etiology. Does not look infectious at this point of time. Mostly dry cough with intermittent sputum production-yellowish and sometimes greenish. No fever or chills. Might be combination of ACE-induced cough (unclear if it should last this long) along with allergies and post nasal drip and bronchitis. - I would not pursue any further imaging today. - I would prescribe Hycodan syrup for cough to see if it helps symptoms. I will see him back in 10-14 days and to reassess.

## 2012-03-30 NOTE — Progress Notes (Signed)
  Subjective:    Patient ID: Terry Parks, male    DOB: [birth year], 56 y.o.   MRN: [id number]  HPI patient is a pleasant 56 year old man with type II DM, hypertensive heart disease, morbid obesity and other chronic issues as per problem list who comes today clinic for his cough for last 2 months.  He reports starting cough after initiating lisinopril which was changed to losartan while he was in hospital in September 2013. Also this measure has not helped his cough much yet. He does report some worsening of his cough for past week. Usually his is a dry cough, but intermittently he produces some sputum- mostly yellow colored. He does report intermittent short of breath not always related to coughing spells. Although he denies any fever, chills, posttussive vomiting, headache.  He does report having allergies. His chest x-ray was done while in hospital September 2013 which showed bronchitic changes but no pneumonia.  He reports better control of his sugars. His blood pressure is elevated today 176/102. He reports eating something unusual yesterday which he should not have and says that's why his blood pressure is high.   Review of Systems    As per history of present illness, all other systems reviewed and negative.  Objective:   Physical Exam  General: NAD HEENT: PERRL, EOMI, no scleral icterus Cardiac: RRR, no rubs, murmurs or gallops Pulm: clear to auscultation bilaterally, moving normal volumes of air, no wheezes or rhonchi. Abd: soft, nontender, nondistended, BS present Ext: warm and well perfused, no pedal edema Neuro: alert and oriented X3, cranial nerves II-XII grossly intact       Assessment & Plan:

## 2012-06-01 ENCOUNTER — Encounter: Payer: Self-pay | Admitting: Internal Medicine

## 2012-07-27 ENCOUNTER — Other Ambulatory Visit (HOSPITAL_COMMUNITY): Payer: Self-pay | Admitting: Internal Medicine

## 2012-08-04 ENCOUNTER — Other Ambulatory Visit: Payer: Self-pay | Admitting: *Deleted

## 2012-08-04 NOTE — Telephone Encounter (Signed)
Pt states 2 meds are no longer on the $4 list at [facility]. Can you change pravachol to lovastatin and change losartan to lisinopril 20 mg??

## 2012-08-04 NOTE — Telephone Encounter (Signed)
Patient is allergic to lisinopril.  Is there a different ARB besides losartan on their list.

## 2012-08-08 MED ORDER — LOVASTATIN 40 MG PO TABS: 40.0000 mg | ORAL_TABLET | Freq: Every day | ORAL | Status: DC

## 2012-08-08 NOTE — Telephone Encounter (Signed)
I changed pravastatin 40 mg daily to lovastatin 40 mg daily and submitted a prescription electronically to the pharmacy; please advise patient to stop pravastatin and start lovastatin 40 mg daily.  Maxide is not an  ideal alternative to losartan (different medications and mechanism of action), so I think the best thing regarding his antihypertensive regimen is to have patient seen in clinic and a decision can be made then about an alternative medication.

## 2012-08-08 NOTE — Telephone Encounter (Signed)
Talked with Pharmacist and only antihypertensive less expensive that losartan is Maxide.

## 2012-08-09 NOTE — Telephone Encounter (Signed)
Pt # [id number] Pt called and informed about change in medications. He does not want to see anyone but Dr Meredith Pel abut states he has enough BP meds to last until next visit.  He has not been taking his medication but received some help and was able to get his meds last week. Scheduled appointment in May.

## 2012-09-06 NOTE — Assessment & Plan Note (Signed)
He was found to have an EF of 40-45% and will need treatment for systolic heart failure.  We will continue his ARB, Beta blocker, and continue to monitor.  If he continues to be hypertensive the next step would be nitrates and hydralazine.  We are limited due to his lack of insurance.

## 2012-09-06 NOTE — Assessment & Plan Note (Signed)
Appears to be slowly improving.  He was cautioned on taking any cough medication with pseudoephedrine in it because of the increased blood pressure.  We discussed using the tessalon and dextromethorphan as well as the chlorpheniramine for the post nasal drip.

## 2012-09-06 NOTE — Assessment & Plan Note (Signed)
Lab Results  Component Value Date   HGBA1C 14.2* 01/31/2012   HGBA1C 6.6 07/15/2010   HGBA1C  Value: 7.0 (NOTE)                                                                       According to the ADA Clinical Practice Recommendations for 2011, when HbA1c is used as a screening test:   >=6.5%   Diagnostic of Diabetes Mellitus           (if abnormal result  is confirmed)  5.7-6.4%   Increased risk of developing Diabetes Mellitus  References:Diagnosis and Classification of Diabetes Mellitus,Diabetes Care,2011,34(Suppl 1):S62-S69 and Standards of Medical Care in         Diabetes - 2011,Diabetes Care,2011,34  (Suppl 1):S11-S61.* 06/05/2010   Lab Results  Component Value Date   MICROALBUR 3.71* 01/15/2010   LDLCALC 182* 01/31/2012   CREATININE 0.90 02/01/2012   A1C in the hospital was 14.2 which indicates non-compliance with his medications.  I encouraged him to continue taking his medications and to follow up in about 3 months to see how he is doing with this control.  We performed his foot exam today as well.

## 2012-09-06 NOTE — Assessment & Plan Note (Signed)
He likely has a reactive airway disease vs some sequela from his systolic heart failure that was found while in the hospital.  We will start the Chlorpheniramine to stop the post nasal drip to see if that helps.

## 2012-09-11 ENCOUNTER — Telehealth: Payer: Self-pay | Admitting: *Deleted

## 2012-09-11 NOTE — Telephone Encounter (Signed)
Pt calls and states he has been having swollen feet and short of breath at intervals, cannot wear shoes at times. States he needs a fluid pill. He is ask to keep may appt w/ dr Meredith Pel and to go to urg care today for eval of shortness of breath and edema. He states he will have someone take him to urg care, he is ask to call for f/u if needed before appt w/ dr Meredith Pel.

## 2012-09-27 ENCOUNTER — Emergency Department (INDEPENDENT_AMBULATORY_CARE_PROVIDER_SITE_OTHER)
Admission: EM | Admit: 2012-09-27 | Discharge: 2012-09-27 | Disposition: A | Discharge status: Home / Self Care | Payer: No Typology Code available for payment source | Source: Home / Self Care | Attending: Family Medicine | Admitting: Family Medicine

## 2012-09-27 ENCOUNTER — Encounter (HOSPITAL_COMMUNITY): Payer: Self-pay | Admitting: *Deleted

## 2012-09-27 DIAGNOSIS — R0789 Other chest pain: Secondary | ICD-10-CM

## 2012-09-27 DIAGNOSIS — R05 Cough: Secondary | ICD-10-CM

## 2012-09-27 DIAGNOSIS — I10 Essential (primary) hypertension: Secondary | ICD-10-CM

## 2012-09-27 MED ORDER — CETIRIZINE HCL 10 MG PO CAPS: 10.0000 mg | ORAL_CAPSULE | Freq: Every day | ORAL | Status: DC

## 2012-09-27 MED ORDER — ALBUTEROL SULFATE (5 MG/ML) 0.5% IN NEBU
INHALATION_SOLUTION | RESPIRATORY_TRACT | Status: AC
  Filled 2012-09-27: qty 1

## 2012-09-27 MED ORDER — CLONIDINE HCL 0.1 MG PO TABS
0.1000 mg | ORAL_TABLET | ORAL | Status: AC
  Administered 2012-09-27: 0.1 mg via ORAL

## 2012-09-27 MED ORDER — CLONIDINE HCL 0.1 MG PO TABS
ORAL_TABLET | ORAL | Status: AC
  Filled 2012-09-27: qty 1

## 2012-09-27 MED ORDER — IPRATROPIUM-ALBUTEROL 18-103 MCG/ACT IN AERO: 2.0000 | INHALATION_SPRAY | Freq: Four times a day (QID) | RESPIRATORY_TRACT | Status: DC | PRN

## 2012-09-27 MED ORDER — HYDROCHLOROTHIAZIDE 25 MG PO TABS: 25.0000 mg | ORAL_TABLET | Freq: Every day | ORAL | Status: DC

## 2012-09-27 MED ORDER — ATENOLOL 50 MG PO TABS: 50.0000 mg | ORAL_TABLET | Freq: Every day | ORAL | Status: DC

## 2012-09-27 MED ORDER — ALBUTEROL SULFATE (5 MG/ML) 0.5% IN NEBU
5.0000 mg | INHALATION_SOLUTION | Freq: Once | RESPIRATORY_TRACT | Status: AC
  Administered 2012-09-27: 5 mg via RESPIRATORY_TRACT

## 2012-09-27 NOTE — ED Notes (Signed)
Pt. Instructed to get his BP rechecked in  2-3 days either with his PCP or at the fire dept.

## 2012-09-27 NOTE — ED Provider Notes (Addendum)
History     CSN: [id number]  Arrival date & time 09/27/12  1702   First MD Initiated Contact with Patient 09/27/12 1829      Chief Complaint  Patient presents with  . Shortness of Breath  . Cough    (Consider location/radiation/quality/duration/timing/severity/associated sxs/prior treatment) HPI This is a 57 year old male who presents with a complaint of an ongoing cough associated with pain in his chest and shortness of breath with exertion present for about 2 months now. He was previously on a blood pressure medicine which she was told causes a cough and therefore it was discontinued. He was also treated for acute bronchitis in the hospital however his cough never resolved. He was given an albuterol inhaler which she has used until it is finished but he states this never really helped him with his symptoms. He did use one of his friend's inhaler which was yellow in color and that helped "open him up" a lot. He occasionally has some white mucus, but otherwise his cough is dry quite severe present during the day and during the night and worse with exertion. He has no previous history of asthma and has never smoked.  Past Medical History  Diagnosis Date  . Diabetes mellitus 04/08/2008  . Hypertension   . Hyperlipemia   . Obstructive sleep apnea 03/06/2008    Sleep study 03/06/08 showed severe OSA/hypopnea syndrome, with successful CPAP titration to 13 CWP using a medium ResMed Mirage Quattro full face mask with heated humidifier.   . Cardiomegaly     Mild by CXR 11/04/04  . BPH (benign prostatic hypertrophy)     Massive BPH noted on cystoscopy 1/23/ 2012 by Dr. Isabel Caprice.  . Renal calculus 05/29/2010    CT scan of abdomen/pelvis on 05/29/2010 showed an obstructing approximate 1-2 mm calculus at the left UVJ, and an approximate 1-2 mm left lower pole renal calculus.   Patient had continuing severe pain , and an elevation of his serum creatinine to a value of 1.75 on 06/06/2010.  The stone had  apparently passed and was not seen on repeat CT 06/08/2010.  [name] Headache   . Allergic rhinitis   . Neck pain   . Abscess of groin   . Abscessed tooth     Past Surgical History  Procedure Laterality Date  . Cystoscopy w/ retrogrades      Family History  Problem Relation Age of Onset  . Breast cancer Mother   . Colon cancer Neg Hx   . Prostate cancer Neg Hx   . Heart attack Neg Hx   . Hypertension Father   . Diabetes Maternal Grandmother     History  Substance Use Topics  . Smoking status: Never Smoker   . Smokeless tobacco: Never Used  . Alcohol Use: No      Review of Systems  Constitutional: Positive for fatigue.  HENT: Positive for postnasal drip and sinus pressure.   Eyes: Negative.   Respiratory: Positive for cough, chest tightness and shortness of breath. Negative for apnea, choking, wheezing and stridor.   Cardiovascular: Positive for leg swelling.  Gastrointestinal: Negative.   Genitourinary: Negative.   Musculoskeletal: Negative.   Neurological: Negative.   Hematological: Negative.   Psychiatric/Behavioral: Negative.     Allergies  Lisinopril  Home Medications   Current Outpatient Rx  Name  Route  Sig  Dispense  Refill  . aspirin EC 81 MG EC tablet   Oral   Take 1 tablet (81 mg total) by mouth daily.         [name]  atenolol (TENORMIN) 50 MG tablet      TAKE ONE TABLET BY MOUTH EVERY DAY   30 tablet   3   . insulin glargine (LANTUS SOLOSTAR) 100 UNIT/ML injection   Subcutaneous   Inject 15 Units into the skin at bedtime.   10 mL   0   . lovastatin (MEVACOR) 40 MG tablet   Oral   Take 1 tablet (40 mg total) by mouth at bedtime.   30 tablet   5     Stop pravastatin.   . metFORMIN (GLUCOPHAGE) 1000 MG tablet      TAKE ONE TABLET BY MOUTH TWICE DAILY   60 tablet   3   . albuterol-ipratropium (COMBIVENT) 18-103 MCG/ACT inhaler   Inhalation   Inhale 2 puffs into the lungs every 6 (six) hours as needed for wheezing.   1 each   0   .  atenolol (TENORMIN) 50 MG tablet   Oral   Take 1 tablet (50 mg total) by mouth daily.   30 tablet   0   . Cetirizine HCl (ZYRTEC ALLERGY) 10 MG CAPS   Oral   Take 1 capsule (10 mg total) by mouth daily.   30 capsule   0   . glucose blood (TRUETRACK TEST) test strip      Use as directed to test blood sugar three times a day before meals.   100 each   0   . hydrochlorothiazide (HYDRODIURIL) 25 MG tablet   Oral   Take 1 tablet (25 mg total) by mouth daily.   30 tablet   0   . Lancets MISC      Use to test blood sugar three times a day before meals.   100 each   0     BP 183/119  Pulse 100  Temp(Src) 97.4 F (36.3 C) (Oral)  Resp 36  SpO2 95%  Physical Exam  Constitutional: He appears well-developed and well-nourished.  HENT:  Head: Normocephalic and atraumatic.  Eyes: Conjunctivae are normal. Pupils are equal, round, and reactive to light.  Neck: Normal range of motion. Neck supple.  Cardiovascular: Normal rate and regular rhythm.   Pulmonary/Chest: Effort normal and breath sounds normal.  cough    ED Course  Procedures (including critical care time)  Labs Reviewed - No data to display No results found.   1. Cough   2. Chest pain, musculoskeletal   3. HTN (hypertension)       MDM  Suspect this may be asthma. Will give Combivent inhaler and refer him for PFTs.  For HTN and pedal edema, will resume his HCTZ and Atenolol. Will give one dose of Clonidine 0.1 mg and also give an Albuterol Neb 5mg  before he leaves. He can pick up his prescriptions in the morning.          Calvert Cantor, MD 09/27/12 1914  Calvert Cantor, MD 09/27/12 2008

## 2012-09-27 NOTE — ED Notes (Signed)
Ran out of his meds 2 weeks ago.  He called the office and they told him to here. He has an appointment on 5/23.  Cough started when he ran out of his meds.

## 2012-09-27 NOTE — ED Notes (Signed)
Pt. did not want to wait for BP to come down. States his ride has been waiting. He went out to the lobby to talk to them. Unable to find Dr. Butler Denmark at that time. Discussed with her and she said it was OK for pt. to leave.  He has been living with this BP for 1 month off his medication, so he is used to it.

## 2012-09-28 ENCOUNTER — Telehealth: Payer: Self-pay | Admitting: *Deleted

## 2012-09-28 NOTE — Telephone Encounter (Signed)
I do not see amlodipine mentioned in the chart as an active medication; I have not seen him since 2012.  He apparently has an appointment with me next week, and hopefully I will be able to clarify his medications then.

## 2012-09-28 NOTE — Telephone Encounter (Signed)
Fax from [facility] pharmacy  - requesting new rx for Amlodipine , which was at [facility] (now closed). I do not see it on current medlist. Thanks

## 2012-09-29 NOTE — Telephone Encounter (Signed)
[facility] pharmacy informed of Dr Meredith Pel' response.

## 2012-10-04 ENCOUNTER — Encounter: Payer: Self-pay | Admitting: Internal Medicine

## 2012-10-04 ENCOUNTER — Ambulatory Visit (HOSPITAL_COMMUNITY)
Admission: RE | Admit: 2012-10-04 | Discharge: 2012-10-04 | Disposition: A | Discharge status: Home / Self Care | Payer: No Typology Code available for payment source | Source: Ambulatory Visit | Attending: Internal Medicine | Admitting: Internal Medicine

## 2012-10-04 ENCOUNTER — Ambulatory Visit (INDEPENDENT_AMBULATORY_CARE_PROVIDER_SITE_OTHER): Payer: No Typology Code available for payment source | Admitting: Internal Medicine

## 2012-10-04 VITALS — BP 155/108 | HR 81 | Temp 97.1°F | Ht 73.0 in | Wt 310.5 lb

## 2012-10-04 DIAGNOSIS — E1139 Type 2 diabetes mellitus with other diabetic ophthalmic complication: Secondary | ICD-10-CM

## 2012-10-04 DIAGNOSIS — I517 Cardiomegaly: Secondary | ICD-10-CM | POA: Insufficient documentation

## 2012-10-04 DIAGNOSIS — I119 Hypertensive heart disease without heart failure: Secondary | ICD-10-CM

## 2012-10-04 DIAGNOSIS — R0602 Shortness of breath: Secondary | ICD-10-CM | POA: Insufficient documentation

## 2012-10-04 DIAGNOSIS — I10 Essential (primary) hypertension: Secondary | ICD-10-CM

## 2012-10-04 DIAGNOSIS — G4733 Obstructive sleep apnea (adult) (pediatric): Secondary | ICD-10-CM

## 2012-10-04 DIAGNOSIS — R05 Cough: Secondary | ICD-10-CM

## 2012-10-04 DIAGNOSIS — R059 Cough, unspecified: Secondary | ICD-10-CM

## 2012-10-04 DIAGNOSIS — E785 Hyperlipidemia, unspecified: Secondary | ICD-10-CM

## 2012-10-04 DIAGNOSIS — E11319 Type 2 diabetes mellitus with unspecified diabetic retinopathy without macular edema: Secondary | ICD-10-CM

## 2012-10-04 LAB — CBC WITH DIFFERENTIAL/PLATELET
Basophils Relative: 0 % (ref 0–1)
Eosinophils Relative: 1 % (ref 0–5)
HCT: 41.6 % (ref 39.0–52.0)
Hemoglobin: 13.8 g/dL (ref 13.0–17.0)
MCHC: 33.2 g/dL (ref 30.0–36.0)
MCV: 78.5 fL (ref 78.0–100.0)
Monocytes Absolute: 0.7 10*3/uL (ref 0.1–1.0)
Monocytes Relative: 9 % (ref 3–12)
Neutro Abs: 4.4 10*3/uL (ref 1.7–7.7)
RDW: 15.2 % (ref 11.5–15.5)

## 2012-10-04 LAB — COMPLETE METABOLIC PANEL WITH GFR
ALT: 26 U/L (ref 0–53)
AST: 33 U/L (ref 0–37)
Albumin: 3.4 g/dL — ABNORMAL LOW (ref 3.5–5.2)
Alkaline Phosphatase: 71 U/L (ref 39–117)
GFR, Est Non African American: 60 mL/min
Potassium: 4.2 mEq/L (ref 3.5–5.3)
Sodium: 138 mEq/L (ref 135–145)
Total Bilirubin: 0.6 mg/dL (ref 0.3–1.2)
Total Protein: 7 g/dL (ref 6.0–8.3)

## 2012-10-04 LAB — GLUCOSE, CAPILLARY: Glucose-Capillary: 253 mg/dL — ABNORMAL HIGH (ref 70–99)

## 2012-10-04 MED ORDER — GLUCOSE BLOOD VI STRP: ORAL_STRIP | Status: DC

## 2012-10-04 MED ORDER — CETIRIZINE HCL 10 MG PO CAPS: 10.0000 mg | ORAL_CAPSULE | Freq: Every day | ORAL | Status: DC

## 2012-10-04 MED ORDER — ASPIRIN 81 MG PO TBEC: 81.0000 mg | DELAYED_RELEASE_TABLET | Freq: Every day | ORAL | Status: DC

## 2012-10-04 MED ORDER — IPRATROPIUM-ALBUTEROL 18-103 MCG/ACT IN AERO: 2.0000 | INHALATION_SPRAY | Freq: Four times a day (QID) | RESPIRATORY_TRACT | Status: DC | PRN

## 2012-10-04 MED ORDER — METFORMIN HCL 1000 MG PO TABS: 1000.0000 mg | ORAL_TABLET | Freq: Two times a day (BID) | ORAL | Status: DC

## 2012-10-04 MED ORDER — FUROSEMIDE 20 MG PO TABS: 20.0000 mg | ORAL_TABLET | Freq: Every day | ORAL | Status: DC

## 2012-10-04 MED ORDER — LANCETS MISC: Status: DC

## 2012-10-04 MED ORDER — ATENOLOL 50 MG PO TABS: 50.0000 mg | ORAL_TABLET | Freq: Every day | ORAL | Status: DC

## 2012-10-04 MED ORDER — LOVASTATIN 40 MG PO TABS: 40.0000 mg | ORAL_TABLET | Freq: Every day | ORAL | Status: DC

## 2012-10-04 NOTE — Assessment & Plan Note (Signed)
Assessment: Patient reports that he does not use his CPAP consistently although he does have a machine.  Plan: I strongly encouraged patient to wear his CPAP every night.

## 2012-10-04 NOTE — Assessment & Plan Note (Signed)
Assessment: Patient's shortness of breath is likely multifactorial, with a component of systolic heart failure (see above) and possibly with a component of bronchitis.    Plan: The plan is to treat heart failure as above by switching diuretic to furosemide with close follow-up in clinic; if his symptoms persist, then will likely need pulmonary function testing.  I also encouraged him to have his Combivent inhaler filled and to use it as prescribed.

## 2012-10-04 NOTE — Assessment & Plan Note (Signed)
Assessment: Patient has a history of mild non-proliferative diabetic retinopathy.  He is overdue for an eye exam.  Plan: I advised him to follow up with his ophthalmologist.

## 2012-10-04 NOTE — Patient Instructions (Signed)
Stop hydrochlorothiazide. Start furosemide 20 mg one tablet daily. Please refill your albuterol/ipratropium (Combivent) inhaler and use as directed.

## 2012-10-04 NOTE — Assessment & Plan Note (Signed)
BP Readings from Last 3 Encounters:  10/04/12 155/108  09/27/12 172/125  03/30/12 176/102    Lab Results  Component Value Date   NA 138 10/04/2012   K 4.2 10/04/2012   CREATININE 1.33 10/04/2012    Assessment: Blood pressure control: moderately elevated Progress toward BP goal:  improved Comments: Patient's blood pressure is moderately elevated; he has been only intermittently compliant with his medication, although he reports recently having started back on his atenolol and hydrochlorothiazide.    Plan: Medications:  stop hydrochlorothiazide and start furosemide 20 mg daily given the evidence of systolic heart failure. Educational resources provided: This needs to be done at next visit. Self management tools provided: This needs to be done at next visit. Other plans: I encouraged compliance with medications and explained the importance of this; I also advised patient to bring all of his medications with him to each clinic visit.

## 2012-10-04 NOTE — Assessment & Plan Note (Addendum)
Dg Chest 2 View 10/04/2012   *RADIOLOGY REPORT*  Clinical Data: Cough and shortness of breath.  CHEST - 2 VIEW  Comparison: 02/03/2012  Findings: New cardiomegaly.  Pulmonary vascularity is normal. There is peribronchial thickening consistent with bronchitis.  No infiltrates or effusions. No significant osseous abnormality.  IMPRESSION: Bronchitic changes.   Original Report Authenticated By: Francene Boyers, M.D.    BNP    Component Value Date/Time   PROBNP 2109.0* 10/04/2012 1031    Assessment: Patient presents today with complaint of a persisting dry cough and dyspnea with occasional wheezing, and with bilateral moderate leg edema; his dyspnea is not clearly exertional but may be aggravated by exertion.  Chest x-ray shows findings consistent with bronchitis, but also shows cardiomegaly; the elevated proBNP suggests a component of systolic heart failure contributing to patient's symptoms.  Review of chart shows that 2-D echocardiogram done 02/01/2012 showed moderate LVH, mildly to moderately reduced left ventricular systolic function with an estimated ejection fraction of 40-45%, and diffuse hypokinesis.  A nuclear medicine stress study done 01/31/2012 showed no reversible ischemia, a small mid anterior wall fixed defect/infarct, and ejection fraction 42%.   Plan: Stop HCTZ and start furosemide 20 mg daily.  I sent refills for all of patient's medications to the [facility] pharmacy today, and I strongly encouraged him to take his medications consistently.  He will return in 2 weeks for reassessment, and I advised him to return sooner if he has any worsening of symptoms.

## 2012-10-04 NOTE — Assessment & Plan Note (Signed)
Assessment: Patient's cough is likely multifactorial, with a component of systolic heart failure (see above) and possibly with a component of bronchitis.    Plan: The plan is to treat heart failure as above by switching diuretic to furosemide with close follow-up in clinic; if his symptoms persist, then will likely need pulmonary function testing.  I also encouraged him to have his Combivent inhaler filled and to use it as prescribed.

## 2012-10-04 NOTE — Assessment & Plan Note (Addendum)
Lab Results  Component Value Date   HGBA1C 8.8 10/04/2012   HGBA1C 14.2* 01/31/2012   HGBA1C 6.6 07/15/2010     Assessment: Diabetes control: fair control Progress toward A1C goal:  improved Comments: Patient's hemoglobin A1c has improved significantly compared to his last measurement; however, he is not adherent to his medications and reports that he has been out of both his insulin and his metformin for more than a month.  His prior dose of Lantus insulin was 15 units daily, and his prior dose of metformin was 1000 mg twice a day.  Plan: Medications:  my plan is to restart metformin 1000 mg twice a day; I will not restart insulin now since he has been on no medication recently.  He may need to resume insulin as well, and this can be reassessed after we see how he does on metformin alone.  I emphasized to him the importance of adherence to his medications. Home glucose monitoring: Frequency: once a day Timing: before breakfast Instruction/counseling given: reminded to get eye exam, reminded to bring blood glucose meter & log to each visit and reminded to bring medications to each visit Educational resources provided: brochure Self management tools provided: home glucose logbook Other plans: Check comprehensive metabolic panel and CBC with differential today.

## 2012-10-04 NOTE — Progress Notes (Signed)
  Subjective:    Patient ID: Terry Parks, male    DOB: 29-Jun-1955, 57 y.o.   MRN: [id number]  HPI Patient presents today with a chief complaint of cough and shortness of breath for which he was seen in the urgent care center last week, and also for follow-up of his chronic medical problems including diabetes mellitus, hypertension, and other issues.  He reports a persisting dry cough with associated shortness of breath and occasional wheezing; the shortness of breath is aggravated by activity.  He also reports swelling of both legs.  He apparently has been out of several of his medications; he has not yet had all of the medications filled that were advised last week in urgent care including his Combivent inhaler.  He reports he is now getting his medications at the [facility] pharmacy.   Review of Systems  Constitutional: Negative for fever, chills and diaphoresis.  Respiratory: Positive for cough (Dry), shortness of breath and wheezing.   Cardiovascular: Positive for leg swelling. Negative for chest pain.  Gastrointestinal: Negative for nausea, vomiting, abdominal pain, blood in stool and anal bleeding.  Genitourinary: Negative for dysuria.  Neurological: Negative for dizziness and syncope.       Objective:   Physical Exam  Constitutional: No distress.  Cardiovascular: Normal rate, regular rhythm, S1 normal, S2 normal and normal heart sounds.  Exam reveals no gallop, no S3, no S4, no distant heart sounds and no friction rub.   No murmur heard. 1+ bilateral leg edema  Pulmonary/Chest: Effort normal and breath sounds normal. No respiratory distress. He has no wheezes. He has no rales.  Abdominal: Soft. Bowel sounds are normal. He exhibits no distension. There is no tenderness. There is no rebound and no guarding.    I reviewed and updated the patient's medication list, past medical history, past surgical history, family history, and social history.      Assessment & Plan:

## 2012-10-04 NOTE — Assessment & Plan Note (Signed)
Lipids:    Component Value Date/Time   CHOL 246* 01/31/2012 1057   TRIG 166* 01/31/2012 1057   HDL 31* 01/31/2012 1057   LDLCALC 182* 01/31/2012 1057   VLDL 33 01/31/2012 1057   CHOLHDL 7.9 01/31/2012 1057     Assessment: Hyperlipidemia control: not controlled Progress toward goals: deteriorated Barriers to meeting goals: non-adherence to medication  Plan: I refilled patient's lovastatin 40 mg daily and strongly encouraged him to take the medication as prescribed.

## 2012-10-19 ENCOUNTER — Ambulatory Visit (INDEPENDENT_AMBULATORY_CARE_PROVIDER_SITE_OTHER): Payer: No Typology Code available for payment source | Admitting: Internal Medicine

## 2012-10-19 ENCOUNTER — Encounter: Payer: Self-pay | Admitting: Internal Medicine

## 2012-10-19 VITALS — BP 155/101 | HR 77 | Temp 97.6°F | Ht 73.0 in | Wt 305.1 lb

## 2012-10-19 DIAGNOSIS — J309 Allergic rhinitis, unspecified: Secondary | ICD-10-CM

## 2012-10-19 DIAGNOSIS — I10 Essential (primary) hypertension: Secondary | ICD-10-CM

## 2012-10-19 DIAGNOSIS — I110 Hypertensive heart disease with heart failure: Secondary | ICD-10-CM

## 2012-10-19 DIAGNOSIS — I509 Heart failure, unspecified: Secondary | ICD-10-CM

## 2012-10-19 LAB — BASIC METABOLIC PANEL WITH GFR
BUN: 9 mg/dL (ref 6–23)
Calcium: 8.8 mg/dL (ref 8.4–10.5)
Creat: 1.14 mg/dL (ref 0.50–1.35)
GFR, Est African American: 83 mL/min
Glucose, Bld: 170 mg/dL — ABNORMAL HIGH (ref 70–99)
Potassium: 4.3 mEq/L (ref 3.5–5.3)

## 2012-10-19 MED ORDER — CETIRIZINE HCL 10 MG PO CAPS: 10.0000 mg | ORAL_CAPSULE | Freq: Every day | ORAL | Status: DC

## 2012-10-19 NOTE — Assessment & Plan Note (Signed)
BP Readings from Last 3 Encounters:  10/19/12 155/101  10/04/12 155/108  09/27/12 172/125    Lab Results  Component Value Date   NA 138 10/04/2012   K 4.2 10/04/2012   CREATININE 1.33 10/04/2012    Assessment: Blood pressure control: moderately elevated Progress toward BP goal:  unchanged Comments: Pt still missing doses of antiHTN.  Plan: Medications:  continue current medications Educational resources provided: brochure   Advised pt of need to add additional antiHTN agent for better BP control. He does not want to start a new med today, prefers to try to be better compliant w current regimen and work on lifestyle changes. Discussed risks of persistent uncontrolled HTN. He understands. Will consider adding agent at next visit if continues to be HTN.

## 2012-10-19 NOTE — Progress Notes (Signed)
Patient ID: Terry Parks, male   DOB: 1955-07-23, 57 y.o.   MRN: [id number]  Subjective:   Patient ID: Terry Parks male   DOB: 1955/11/19 57 y.o.   MRN: [id number]  HPI: [name] is a 57 y.o. male with history hypertensive cardiomyopathy and DM presenting for 2 week follow up for SOB, LE edema and HTN. At visit 10/04/12 w PCP, pt noted persistent dry cough and SOB as well as bilateral LE edema. CXR performed showed cardiomegaly without pulm edema, BNP was elevated, and he was hypertensive. HI does have a h/o CHF w most recent EF 40-45%. His HCTZ was stopped and he was started on lasix 20mg  daily. He was also moderately hypertensive w BP 155/108, however had been only intermittently compliant w BP meds. Today he reports interval improvement in swelling/SOB since starting Lasix 20. Denies any dizziness. Not having CP or chest pressure. Some watery eyes and stuffy nose, wants RF of allergy meds. BP still high today, but pt says intermittent compliance w atenolol. He does not want to change any BP meds today.    Past Medical History  Diagnosis Date  . Diabetes mellitus 04/08/2008  . Hypertension   . Hyperlipemia   . Obstructive sleep apnea 03/06/2008    Sleep study 03/06/08 showed severe OSA/hypopnea syndrome, with successful CPAP titration to 13 CWP using a medium ResMed Mirage Quattro full face mask with heated humidifier.   . Cardiomegaly     Mild by CXR 11/04/04  . BPH (benign prostatic hypertrophy)     Massive BPH noted on cystoscopy 1/23/ 2012 by Dr. Isabel Caprice.  . Renal calculus 05/29/2010    CT scan of abdomen/pelvis on 05/29/2010 showed an obstructing approximate 1-2 mm calculus at the left UVJ, and an approximate 1-2 mm left lower pole renal calculus.   Patient had continuing severe pain , and an elevation of his serum creatinine to a value of 1.75 on 06/06/2010.  The stone had apparently passed and was not seen on repeat CT 06/08/2010.  [name] Headache(784.0)   . Allergic rhinitis    . Neck pain   . Abscess of groin   . Abscessed tooth   . Hypertensive cardiopathy 03/01/2006    2-D echocardiogram 02/01/2012 showed moderate LVH, mildly to moderately reduced left ventricular systolic function with an estimated ejection fraction of 40-45%, and diffuse hypokinesis.  A nuclear medicine stress study done 01/31/2012 showed no reversible ischemia, a small mid anterior wall fixed defect/infarct, and ejection fraction 42%.       Current Outpatient Prescriptions  Medication Sig Dispense Refill  . albuterol-ipratropium (COMBIVENT) 18-103 MCG/ACT inhaler Inhale 2 puffs into the lungs every 6 (six) hours as needed for wheezing or shortness of breath.  1 each  6  . aspirin 81 MG EC tablet Take 1 tablet (81 mg total) by mouth daily.  30 tablet  6  . atenolol (TENORMIN) 50 MG tablet Take 1 tablet (50 mg total) by mouth daily.  30 tablet  6  . Cetirizine HCl (ZYRTEC ALLERGY) 10 MG CAPS Take 1 capsule (10 mg total) by mouth daily.  30 capsule  6  . furosemide (LASIX) 20 MG tablet Take 1 tablet (20 mg total) by mouth daily.  30 tablet  3  . glucose blood (TRUETRACK TEST) test strip Use as directed to test blood sugar three times a day before meals.  100 each  1  . Lancets MISC Use to test blood sugar three times a day before meals.  100 each  1  . lovastatin (MEVACOR) 40 MG tablet Take 1 tablet (40 mg total) by mouth at bedtime.  30 tablet  6  . metFORMIN (GLUCOPHAGE) 1000 MG tablet Take 1 tablet (1,000 mg total) by mouth 2 (two) times daily with a meal.  60 tablet  6  . [DISCONTINUED] albuterol (PROVENTIL HFA;VENTOLIN HFA) 108 (90 BASE) MCG/ACT inhaler Inhale 1-2 puffs into the lungs every 6 (six) hours as needed for wheezing or shortness of breath.      . [DISCONTINUED] chlorpheniramine (CHLOR-TRIMETON) 4 MG tablet Take 1 tablet (4 mg total) by mouth 2 (two) times daily as needed for allergies.  30 tablet  0  . [DISCONTINUED] losartan (COZAAR) 25 MG tablet Take 1 tablet (25 mg total) by mouth  daily.  30 tablet  2   No current facility-administered medications for this visit.   Family History  Problem Relation Age of Onset  . Breast cancer Mother   . Colon cancer Neg Hx   . Prostate cancer Neg Hx   . Heart attack Neg Hx   . Hypertension Father   . Diabetes Maternal Grandmother    History   Social History  . Marital Status: Single    Spouse Name: N/A    Number of Children: N/A  . Years of Education: N/A   Social History Main Topics  . Smoking status: Never Smoker   . Smokeless tobacco: Never Used  . Alcohol Use: No  . Drug Use: No  . Sexually Active: None   Other Topics Concern  . None   Social History Narrative   Divorced, 4 children, lives alone.  Works for [employer].   Review of Systems: 10 pt ROS performed, pertinent positives and negatives noted in HPI Objective:  Physical Exam: Filed Vitals:   10/19/12 0924  BP: 163/101  Pulse: 86  Temp: 97.6 F (36.4 C)  TempSrc: Oral  Height: 6\' 1"  (1.854 m)  Weight: 305 lb 1.6 oz (138.392 kg)  SpO2: 97%   Vitals reviewed. General: overweight male sitting in chair, NAD HEENT: PERRL, EOMI, no scleral icterus Cardiac: RRR, no rubs, murmurs or gallops Pulm: CTAB no rales Abd: soft, nontender, nondistended, BS present Ext: 1+ LE edema to mid shin bilaterally. 2+ DP pulses bilaterally. Sensation intact to fine touch of soles. No blisters.   Assessment & Plan:   Please see problem-based charting for assessment and plan.

## 2012-10-19 NOTE — Assessment & Plan Note (Signed)
Pt w watery eyes, rhinitis, h/o seasonal allergies. Requesting RF of cetirizine. - RF today

## 2012-10-19 NOTE — Patient Instructions (Signed)
1. Keep taking lasix one pill a day. Please take your blood pressure medicine EVERY DAY. 2. Please call us if you have worsening shortness of breath, cough, leg swelling, chest pain/pressure. 3. Come back in 3 months to see Dr. Meredith Pel. I will call you if any of your labs are not normal.   Diabetes and Foot Care Diabetes may cause you to have a poor blood supply (circulation) to your legs and feet. Because of this, the skin may be thinner, break easier, and heal more slowly. You also may have nerve damage in your legs and feet causing decreased feeling. You may not notice minor injuries to your feet that could lead to serious problems or infections. Taking care of your feet is one of the most important things you can do for yourself.  HOME CARE INSTRUCTIONS  Do not go barefoot. Bare feet are easily injured.  Check your feet daily for blisters, cuts, and redness.  Wash your feet with warm water (not hot) and mild soap. Pat your feet and between your toes until completely dry.  Apply a moisturizing lotion that does not contain alcohol or petroleum jelly to the dry skin on your feet and to dry brittle toenails. Do not put it between your toes.  Trim your toenails straight across. Do not dig under them or around the cuticle.  Do not cut corns or calluses, or try to remove them with medicine.  Wear clean cotton socks or stockings every day. Make sure they are not too tight. Do not wear knee high stockings since they may decrease blood flow to your legs.  Wear leather shoes that fit properly and have enough cushioning. To break in new shoes, wear them just a few hours a day to avoid injuring your feet.  Wear shoes at all times, even in the house.  Do not cross your legs. This may decrease the blood flow to your feet.  If you find a minor scrape, cut, or break in the skin on your feet, keep it and the skin around it clean and dry. These areas may be cleansed with mild soap and water. Do not use  peroxide, alcohol, iodine or Merthiolate.  When you remove an adhesive bandage, be sure not to harm the skin around it.  If you have a wound, look at it several times a day to make sure it is healing.  Do not use heating pads or hot water bottles. Burns can occur. If you have lost feeling in your feet or legs, you may not know it is happening until it is too late.  Report any cuts, sores or bruises to your caregiver. Do not wait! SEEK MEDICAL CARE IF:   You have an injury that is not healing or you notice redness, numbness, burning, or tingling.  Your feet always feel cold.  You have pain or cramps in your legs and feet. SEEK IMMEDIATE MEDICAL CARE IF:   There is increasing redness, swelling, or increasing pain in the wound.  There is a red line that goes up your leg.  Pus is coming from a wound.  You develop an unexplained oral temperature above 102 F (38.9 C), or as your caregiver suggests.  You notice a bad smell coming from an ulcer or wound. MAKE SURE YOU:   Understand these instructions.  Will watch your condition.  Will get help right away if you are not doing well or get worse. Document Released: 04/30/2000 Document Revised: 07/26/2011 Document Reviewed: 11/06/2008 [employer]  Patient Information 2014 [employer].  

## 2012-10-19 NOTE — Assessment & Plan Note (Signed)
Interval improvement in volume status on lasix 20mg  daily. Weight down 5lbs. Decreased peripheral edema, improvement in SOB. No dizziness. - Continue lasix 20mg  daily - Bmet today - Discussed return/call paramaters: worsening SOB, DOE, worsening cough, CP/pressure, worsening LE edema

## 2012-10-20 NOTE — Progress Notes (Signed)
TEACHING ATTENDING ADDENDUM: I discussed this case with Dr. Ziemer at the time of the patient visit. I agree with the HPI, exam findings and have read the documentation provided by the resident,  and I concur with the plan of care. Please see the resident note for details of management.   

## 2012-11-23 ENCOUNTER — Other Ambulatory Visit: Payer: Self-pay

## 2012-11-29 ENCOUNTER — Ambulatory Visit: Payer: No Typology Code available for payment source

## 2013-01-15 ENCOUNTER — Encounter (HOSPITAL_COMMUNITY): Payer: Self-pay | Admitting: *Deleted

## 2013-01-15 ENCOUNTER — Emergency Department (INDEPENDENT_AMBULATORY_CARE_PROVIDER_SITE_OTHER)
Admission: EM | Admit: 2013-01-15 | Discharge: 2013-01-15 | Disposition: A | Discharge status: Home / Self Care | Payer: No Typology Code available for payment source | Source: Home / Self Care | Attending: Emergency Medicine | Admitting: Emergency Medicine

## 2013-01-15 DIAGNOSIS — R609 Edema, unspecified: Secondary | ICD-10-CM

## 2013-01-15 DIAGNOSIS — R600 Localized edema: Secondary | ICD-10-CM

## 2013-01-15 LAB — POCT I-STAT, CHEM 8
BUN: 8 mg/dL (ref 6–23)
Chloride: 104 mEq/L (ref 96–112)
Creatinine, Ser: 1.1 mg/dL (ref 0.50–1.35)
Glucose, Bld: 301 mg/dL — ABNORMAL HIGH (ref 70–99)
Potassium: 4.3 mEq/L (ref 3.5–5.1)

## 2013-01-15 MED ORDER — FUROSEMIDE 40 MG PO TABS
40.0000 mg | ORAL_TABLET | Freq: Once | ORAL | Status: AC
  Administered 2013-01-15: 40 mg via ORAL

## 2013-01-15 MED ORDER — FUROSEMIDE 40 MG PO TABS
ORAL_TABLET | ORAL | Status: AC
  Filled 2013-01-15: qty 1

## 2013-01-15 MED ORDER — FUROSEMIDE 40 MG PO TABS: 40.0000 mg | ORAL_TABLET | Freq: Every day | ORAL | Status: DC

## 2013-01-15 NOTE — ED Provider Notes (Signed)
Medical screening examination/treatment/procedure(s) were performed by non-physician practitioner and as supervising physician I was immediately available for consultation/collaboration.  Leslee Home, M.D.  Reuben Likes, MD 01/15/13 [phone]

## 2013-01-15 NOTE — ED Notes (Signed)
Pt has  Bilateral  Swelling of  Both  Legs  And  Feet  X  Several weeks       Pt  States  Is  Out  Of  His  Fluid  meds     For  sev  Months   Pt is  In no acute  Distress    Sitting upright on  Exam table  Speaking in  Complete  sentances

## 2013-01-15 NOTE — ED Provider Notes (Signed)
CSN: [id number]     Arrival date & time 01/15/13  1055 History   First MD Initiated Contact with Patient 01/15/13 1153     Chief Complaint  Patient presents with  . Leg Swelling   (Consider location/radiation/quality/duration/timing/severity/associated sxs/prior Treatment) HPI Comments: 57 year old male presents for evaluation of leg soreness and swelling. Began several weeks ago after he ran out of his fluid pill. The swelling has been getting progressively worse to the point where now is becoming sore and his feet are starting to feel a bit tingly. He states he was previously taking HCTZ for swelling but this was discontinued. He has a long history of having swelling in his legs that was previously controlled with HCTZ. He does not know why it was discontinued. He also states that he occasionally will get shortness of breath like it is hard to breathe but he is not experiencing this right now. He denies fever, chills, chest pain, history of DVT or PE. No Recent travel   Past Medical History  Diagnosis Date  . Diabetes mellitus 04/08/2008  . Hypertension   . Hyperlipemia   . Obstructive sleep apnea 03/06/2008    Sleep study 03/06/08 showed severe OSA/hypopnea syndrome, with successful CPAP titration to 13 CWP using a medium ResMed Mirage Quattro full face mask with heated humidifier.   . Cardiomegaly     Mild by CXR 11/04/04  . BPH (benign prostatic hypertrophy)     Massive BPH noted on cystoscopy 1/23/ 2012 by Dr. Isabel Caprice.  . Renal calculus 05/29/2010    CT scan of abdomen/pelvis on 05/29/2010 showed an obstructing approximate 1-2 mm calculus at the left UVJ, and an approximate 1-2 mm left lower pole renal calculus.   Patient had continuing severe pain , and an elevation of his serum creatinine to a value of 1.75 on 06/06/2010.  The stone had apparently passed and was not seen on repeat CT 06/08/2010.  [name] Headache(784.0)   . Allergic rhinitis   . Neck pain   . Abscess of groin   . Abscessed  tooth   . Hypertensive cardiopathy 03/01/2006    2-D echocardiogram 02/01/2012 showed moderate LVH, mildly to moderately reduced left ventricular systolic function with an estimated ejection fraction of 40-45%, and diffuse hypokinesis.  A nuclear medicine stress study done 01/31/2012 showed no reversible ischemia, a small mid anterior wall fixed defect/infarct, and ejection fraction 42%.       Past Surgical History  Procedure Laterality Date  . Cystoscopy w/ retrogrades     Family History  Problem Relation Age of Onset  . Breast cancer Mother   . Colon cancer Neg Hx   . Prostate cancer Neg Hx   . Heart attack Neg Hx   . Hypertension Father   . Diabetes Maternal Grandmother    History  Substance Use Topics  . Smoking status: Never Smoker   . Smokeless tobacco: Never Used  . Alcohol Use: No    Review of Systems  Constitutional: Negative for fever, chills and fatigue.  HENT: Negative for sore throat, neck pain and neck stiffness.   Eyes: Negative for visual disturbance.  Respiratory: Positive for shortness of breath. Negative for cough.   Cardiovascular: Positive for leg swelling. Negative for chest pain and palpitations.  Gastrointestinal: Negative for nausea, vomiting, abdominal pain, diarrhea and constipation.  Genitourinary: Negative for dysuria, urgency, frequency and hematuria.  Musculoskeletal: Negative for myalgias and arthralgias.  Skin: Negative for rash.  Neurological: Negative for dizziness, weakness and light-headedness.  Allergies  Lisinopril  Home Medications   Current Outpatient Rx  Name  Route  Sig  Dispense  Refill  . albuterol-ipratropium (COMBIVENT) 18-103 MCG/ACT inhaler   Inhalation   Inhale 2 puffs into the lungs every 6 (six) hours as needed for wheezing or shortness of breath.   1 each   6   . aspirin 81 MG EC tablet   Oral   Take 1 tablet (81 mg total) by mouth daily.   30 tablet   6   . atenolol (TENORMIN) 50 MG tablet   Oral   Take 1  tablet (50 mg total) by mouth daily.   30 tablet   6   . Cetirizine HCl (ZYRTEC ALLERGY) 10 MG CAPS   Oral   Take 1 capsule (10 mg total) by mouth daily.   30 capsule   6   . furosemide (LASIX) 40 MG tablet   Oral   Take 1 tablet (40 mg total) by mouth daily.   14 tablet   3   . glucose blood (TRUETRACK TEST) test strip      Use as directed to test blood sugar three times a day before meals.   100 each   1   . Lancets MISC      Use to test blood sugar three times a day before meals.   100 each   1   . lovastatin (MEVACOR) 40 MG tablet   Oral   Take 1 tablet (40 mg total) by mouth at bedtime.   30 tablet   6     Stop pravastatin.   . metFORMIN (GLUCOPHAGE) 1000 MG tablet   Oral   Take 1 tablet (1,000 mg total) by mouth 2 (two) times daily with a meal.   60 tablet   6    BP 186/107  Pulse 103  Temp(Src) 98.5 F (36.9 C) (Oral)  Resp 18  SpO2 96% Physical Exam  Constitutional: He is oriented to person, place, and time. He appears well-developed and well-nourished. No distress.  Obese habitus  HENT:  Head: Normocephalic and atraumatic.  Cardiovascular: Normal rate, regular rhythm and normal pulses.  Exam reveals no gallop and no friction rub.   No murmur heard. 3+ pitting edema on feet and lower legs, pitting up to the knees  Pulmonary/Chest: Effort normal and breath sounds normal. No respiratory distress. He has no wheezes. He has no rales.  Neurological: He is alert and oriented to person, place, and time. Coordination normal.  Skin: Skin is warm and dry. No rash noted.  Psychiatric: He has a normal mood and affect. Judgment normal.    ED Course  Procedures (including critical care time) Labs Review Labs Reviewed  POCT I-STAT, CHEM 8 - Abnormal; Notable for the following:    Glucose, Bld 301 (*)    All other components within normal limits   Imaging Review No results found.  MDM   1. Edema of both legs    Kidney function is normal. His HCTZ  was actually switched to Lasix but he did not refill the Lasix but ran out because he did not know this was the fluid pill. I will start him on 40 mg daily of Lasix for 2 weeks and then he will decrease back down to the regular 20 mg. He'll followup with his primary care doctor with regard to the high blood pressure and high blood sugar. He does note that he did not take any of his medications today since  this could explain both of these issues   Meds ordered this encounter  Medications  . furosemide (LASIX) 40 MG tablet    Sig: Take 1 tablet (40 mg total) by mouth daily.    Dispense:  14 tablet    Refill:  3  . furosemide (LASIX) tablet 40 mg    Sig:        Graylon Good, PA-C 01/15/13 1317

## 2013-01-24 ENCOUNTER — Encounter: Payer: Self-pay | Admitting: Internal Medicine

## 2013-01-24 ENCOUNTER — Ambulatory Visit (INDEPENDENT_AMBULATORY_CARE_PROVIDER_SITE_OTHER): Payer: No Typology Code available for payment source | Admitting: Internal Medicine

## 2013-01-24 VITALS — BP 160/120 | HR 60 | Temp 97.9°F | Ht 72.0 in | Wt 299.2 lb

## 2013-01-24 DIAGNOSIS — E11319 Type 2 diabetes mellitus with unspecified diabetic retinopathy without macular edema: Secondary | ICD-10-CM

## 2013-01-24 DIAGNOSIS — I119 Hypertensive heart disease without heart failure: Secondary | ICD-10-CM

## 2013-01-24 DIAGNOSIS — E785 Hyperlipidemia, unspecified: Secondary | ICD-10-CM

## 2013-01-24 DIAGNOSIS — E1139 Type 2 diabetes mellitus with other diabetic ophthalmic complication: Secondary | ICD-10-CM

## 2013-01-24 DIAGNOSIS — I10 Essential (primary) hypertension: Secondary | ICD-10-CM

## 2013-01-24 DIAGNOSIS — Z111 Encounter for screening for respiratory tuberculosis: Secondary | ICD-10-CM

## 2013-01-24 DIAGNOSIS — Z23 Encounter for immunization: Secondary | ICD-10-CM

## 2013-01-24 LAB — CBC WITH DIFFERENTIAL/PLATELET
Basophils Absolute: 0 10*3/uL (ref 0.0–0.1)
Eosinophils Relative: 1 % (ref 0–5)
HCT: 46.1 % (ref 39.0–52.0)
Lymphocytes Relative: 42 % (ref 12–46)
Lymphs Abs: 2.5 10*3/uL (ref 0.7–4.0)
MCV: 77.5 fL — ABNORMAL LOW (ref 78.0–100.0)
Neutro Abs: 2.7 10*3/uL (ref 1.7–7.7)
Platelets: 165 10*3/uL (ref 150–400)
RBC: 5.95 MIL/uL — ABNORMAL HIGH (ref 4.22–5.81)
RDW: 16 % — ABNORMAL HIGH (ref 11.5–15.5)
WBC: 6.1 10*3/uL (ref 4.0–10.5)

## 2013-01-24 LAB — COMPLETE METABOLIC PANEL WITH GFR
ALT: 16 U/L (ref 0–53)
Albumin: 3.8 g/dL (ref 3.5–5.2)
CO2: 29 mEq/L (ref 19–32)
Chloride: 100 mEq/L (ref 96–112)
GFR, Est African American: 87 mL/min
GFR, Est Non African American: 75 mL/min
Glucose, Bld: 283 mg/dL — ABNORMAL HIGH (ref 70–99)
Potassium: 4.2 mEq/L (ref 3.5–5.3)
Sodium: 135 mEq/L (ref 135–145)
Total Protein: 6.8 g/dL (ref 6.0–8.3)

## 2013-01-24 LAB — GLUCOSE, CAPILLARY: Glucose-Capillary: 316 mg/dL — ABNORMAL HIGH (ref 70–99)

## 2013-01-24 LAB — LIPID PANEL: LDL Cholesterol: 171 mg/dL — ABNORMAL HIGH (ref 0–99)

## 2013-01-24 LAB — HM DIABETES EYE EXAM

## 2013-01-24 MED ORDER — LOSARTAN POTASSIUM 25 MG PO TABS: 25.0000 mg | ORAL_TABLET | Freq: Every day | ORAL | Status: DC

## 2013-01-24 MED ORDER — FUROSEMIDE 40 MG PO TABS: 40.0000 mg | ORAL_TABLET | Freq: Every day | ORAL | Status: DC

## 2013-01-24 MED ORDER — IPRATROPIUM-ALBUTEROL 18-103 MCG/ACT IN AERO: 2.0000 | INHALATION_SPRAY | Freq: Four times a day (QID) | RESPIRATORY_TRACT | Status: DC | PRN

## 2013-01-24 MED ORDER — INSULIN GLARGINE 100 UNIT/ML SOLOSTAR PEN: 10.0000 [IU] | PEN_INJECTOR | Freq: Every day | SUBCUTANEOUS | Status: DC

## 2013-01-24 NOTE — Patient Instructions (Signed)
General Instructions: Please take your metformin 1000 mg twice a day as prescribed. Continue furosemide (Lasix) 40 mg one tablet daily. Start losartan (Diovan) 25 mg one tablet daily. Please schedule an appointment with diabetes educator Lupita Leash Plyler for instruction in blood sugar monitoring and insulin administration; please bring your meter and test strips to your appointment. Following your visit with [facility], the plan will be to start Lantus Solostar insulin 10 units daily at bedtime; do not start the insulin until instructed to do so by the diabetes educator.    Progress Toward Treatment Goals:  Treatment Goal 01/24/2013  Hemoglobin A1C deteriorated  Blood pressure unchanged    Self Care Goals & Plans:  Self Care Goal 10/19/2012  Manage my medications take my medicines as prescribed; bring my medications to every visit  Monitor my health bring my glucose meter and log to each visit; check my feet daily  Eat healthy foods eat foods that are low in salt  Be physically active park at the far end of the parking lot    Home Blood Glucose Monitoring 01/24/2013  Check my blood sugar 2 times a day  When to check my blood sugar before breakfast; before dinner     Care Management & Community Referrals:  Referral 01/24/2013  Referrals made for care management support diabetes educator  Referrals made to community resources none

## 2013-01-24 NOTE — Progress Notes (Signed)
  Subjective:    Patient ID: Terry Parks, male    DOB: Sep 29, 1955, 57 y.o.   MRN: [id number]  HPI Patient returns for followup of his congestive heart failure, uncontrolled hypertension, diabetes mellitus, and other chronic medical problems.  He was seen in the emergency department on September 1 with increased leg edema and shortness of breath; at that time, he was not taking furosemide which are previously been prescribed.  He was started at that time on furosemide 40 mg daily, and he reports substantial improvement in his symptoms, with less leg edema and resolution of his shortness of breath.  He still does have persisting bilateral leg edema.  He has not been compliant with his medications; he reports that he often misses his evening dose of metformin.  He has not been monitoring his blood sugars at home because he reports that his meter is not functioning properly.  He has trouble affording his medications, and [facility] [place] [facility].  Patient requested a PPD today for his job.   Review of Systems  Constitutional: Negative for fever, chills and diaphoresis.  Respiratory: Negative for shortness of breath.   Cardiovascular: Positive for leg swelling. Negative for chest pain.  Gastrointestinal: Negative for nausea, vomiting, abdominal pain and blood in stool.  Genitourinary: Negative for dysuria.  Musculoskeletal: Negative for myalgias.  Neurological: Negative for dizziness, syncope, weakness and numbness.       Objective:   Physical Exam  Constitutional: No distress.  Cardiovascular: Normal rate, regular rhythm and normal heart sounds.  Exam reveals no gallop and no friction rub.   No murmur heard. 2+ bilateral pitting leg edema  Pulmonary/Chest: Effort normal and breath sounds normal. No respiratory distress. He has no wheezes. He has no rales.  Abdominal: Bowel sounds are normal. He exhibits no distension. There is no hepatosplenomegaly. There is no  tenderness. There is no rebound and no guarding.        Assessment & Plan:

## 2013-01-25 ENCOUNTER — Telehealth: Payer: Self-pay | Admitting: *Deleted

## 2013-01-25 LAB — MICROALBUMIN / CREATININE URINE RATIO
Creatinine, Urine: 245.9 mg/dL
Microalb Creat Ratio: 66.1 mg/g — ABNORMAL HIGH (ref 0.0–30.0)

## 2013-01-25 NOTE — Assessment & Plan Note (Signed)
BP Readings from Last 3 Encounters:  01/24/13 160/120  01/15/13 186/107  10/19/12 155/101    Assessment: Blood pressure2 control: moderately elevated Progress toward BP goal:  unchanged Comments: The patient has been noncompliant with his medications, and I explained the importance of medication compliance improving further end organ problems.  Plan: Medications:  continue atenolol 50 mg daily and furosemide 40 mg daily; restart losartan 25 mg daily (patient reports no problems when taking losartan in the past)

## 2013-01-25 NOTE — Assessment & Plan Note (Signed)
Lab Results  Component Value Date   HGBA1C 9.7 01/24/2013   HGBA1C 8.8 10/04/2012   HGBA1C 14.2* 01/31/2012     Assessment: Diabetes control: poor control (HgbA1C >9%) Progress toward A1C goal:  deteriorated Comments: Patient reports that he has often been taking his metformin only one time a day rather than 2 times a day.  Plan: Medications:  continue metformin 1000 mg twice a day (I emphasized the importance of taking the medication as prescribed); refer to Norm Parcel for diabetes education and instruction in insulin administration, then start Lantus insulin 10 units daily at bedtime in addition to his metformin (patient reports no problems when on Lantus insulin in the past). Home glucose monitoring: Frequency: 2 times a day Timing: before breakfast;before dinner Instruction/counseling given: reminded to bring blood glucose meter & log to each visit and reminded to bring medications to each visit Other plans: refer for retinal scan; schedule followup with ophthalmologist

## 2013-01-25 NOTE — Assessment & Plan Note (Signed)
Assessment: Patient reports improvement in his lower extremity edema and shortness of breath on furosemide 40 mg daily prescribed in the emergency department.  Plan: Continue furosemide 40 mg daily; I emphasized to patient the importance of compliance with his medication regimen.  Will check a comprehensive metabolic panel and CBC with differential.

## 2013-01-25 NOTE — Telephone Encounter (Signed)
Fax from [employer] -Combivent is no longer made; needs new rx for Combivent Respimat w/dose of 1 puff q6h. Thanks

## 2013-01-26 ENCOUNTER — Ambulatory Visit: Payer: No Typology Code available for payment source

## 2013-01-26 MED ORDER — IPRATROPIUM-ALBUTEROL 20-100 MCG/ACT IN AERS: 1.0000 | INHALATION_SPRAY | Freq: Four times a day (QID) | RESPIRATORY_TRACT | Status: DC | PRN

## 2013-01-26 NOTE — Telephone Encounter (Signed)
I changed Combivent prescription as requested.

## 2013-02-01 ENCOUNTER — Encounter: Payer: Self-pay | Admitting: Dietician

## 2013-02-01 ENCOUNTER — Ambulatory Visit (INDEPENDENT_AMBULATORY_CARE_PROVIDER_SITE_OTHER): Payer: No Typology Code available for payment source | Admitting: Dietician

## 2013-02-01 VITALS — Wt 297.1 lb

## 2013-02-01 DIAGNOSIS — E1165 Type 2 diabetes mellitus with hyperglycemia: Secondary | ICD-10-CM

## 2013-02-01 NOTE — Progress Notes (Signed)
Medical Nutrition Therapy:  Appt start time: 0945 end time:  1030.  Assessment:  Primary concerns today: Blood sugar control.  Patient seen to today to assist with blood sugar control. He verbalizes many healthier behaviors he has mastered and others he is currently working on.  Usual eating pattern includes 3 meals and 2-3 snacks per day. His reported intake includes some high salt and fat food choices. His meter download shows aveage of 13 blood sugars in past 8 days is 316 mg/dl, pattern is increasing values from morning to night.     Progress Towards Goal(s):  In progress.   Nutritional Diagnosis:  NB-1.4 Self-monitoring deficit As related to reported complacency in checking blood sugars vs oscilating acceptance of diabetes self care needs.  As evidenced by his report. .    Intervention:  Nutrition education in interpreting blood sugar information, reasons for signs and symptoms of high blood sugar and review of diabetes meal planning.  Coordination of care- patient is interested in extended release metformin to assist him with medication adherence.   Monitoring/Evaluation:  Dietary intake, exercise, blood sugars, and body weight in 3 week(s).

## 2013-02-01 NOTE — Patient Instructions (Addendum)
You said you are going to start the lantus 10 units a day as soon as you get it.   Continue checking before meals blood sugars- can bring both meters next visit  Low Sodium Vegetable juice cocktail- can add a dash of hot sauce   Go Lean- Rosalie Doctor Hearty All Natural - Instant Hot cereal with clusters  Unsalted nuts, any vegetable or lean protein are good low carb snacks-   snacks- fruit, popcorn, nuts, hard boiled eggs  See you  October 9th at 9:30 AM.

## 2013-02-07 ENCOUNTER — Encounter: Payer: Self-pay | Admitting: Internal Medicine

## 2013-02-07 ENCOUNTER — Ambulatory Visit (INDEPENDENT_AMBULATORY_CARE_PROVIDER_SITE_OTHER): Payer: No Typology Code available for payment source | Admitting: Internal Medicine

## 2013-02-07 VITALS — BP 157/96 | HR 83 | Temp 97.4°F | Ht 72.0 in | Wt 294.9 lb

## 2013-02-07 DIAGNOSIS — I10 Essential (primary) hypertension: Secondary | ICD-10-CM

## 2013-02-07 DIAGNOSIS — E1165 Type 2 diabetes mellitus with hyperglycemia: Secondary | ICD-10-CM

## 2013-02-07 LAB — GLUCOSE, CAPILLARY: Glucose-Capillary: 212 mg/dL — ABNORMAL HIGH (ref 70–99)

## 2013-02-07 NOTE — Patient Instructions (Signed)
1.  Start injecting 10 units of Lantus Insulin at night. 2. Monitor your blood sugars 3 times a day. 3.  Keep a Blood Pressure log (check once a day).

## 2013-02-07 NOTE — Assessment & Plan Note (Addendum)
BP Readings from Last 3 Encounters:  02/07/13 157/96  01/24/13 160/120  01/15/13 186/107    Lab Results  Component Value Date   NA 135 01/24/2013   K 4.2 01/24/2013   CREATININE 1.10 01/24/2013    Assessment: Blood pressure control: mildly elevated Progress toward BP goal:  improved Comments:   Plan: Medications:  Continue Atenolol 50mg  daily, Losartan 25mg  daily, Lasix 40mg  daily Educational resources provided:   Self management tools provided:   Other plans: Patient's bp is improved from last two visits.  Will continue current medications, patient has home BP machine and encouraged to log daily BP values and bring to next visit. [name]

## 2013-02-07 NOTE — Progress Notes (Signed)
Patient ID: Terry Parks, male   DOB: 01-19-1956, 57 y.o.   MRN: [id number]   Subjective:   Patient ID: Terry Parks male   DOB: 1955-08-05 57 y.o.   MRN: [id number]  HPI: Terry Parks is a 57 y.o. male with PMH of DM, HTN, HLD, OSA, CHF.  He presents today for follow up after seeing our nutritionist Terry Parks.  He reports making a lot of changes in his diet since seeing her, he is no longer eating large meals at dinner and trying to eat smaller meals throughout the day.  He reports he has been checking his BS TID and reports readings of the 250s, he did not bring his glucometer today.  He reports that for the past week he has been taking Metformin twice a day as prescribed but admits to occasionally forgetting second dose in the past.  He has not started using Lantus 10units at night yet but reports since he has taken insulin in the past he is comfortable starting the medication.  He reports he does know the signs of low blood sugar and has not experienced any,  He keeps a small candy bar in his testing kit in case of low blood sugars.    Past Medical History  Diagnosis Date  . Diabetes mellitus 04/08/2008  . Hypertension   . Hyperlipemia   . Obstructive sleep apnea 03/06/2008    Sleep study 03/06/08 showed severe OSA/hypopnea syndrome, with successful CPAP titration to 13 CWP using a medium ResMed Mirage Quattro full face mask with heated humidifier.   . Cardiomegaly     Mild by CXR 11/04/04  . BPH (benign prostatic hypertrophy)     Massive BPH noted on cystoscopy 1/23/ 2012 by Dr. Isabel Parks.  . Renal calculus 05/29/2010    CT scan of abdomen/pelvis on 05/29/2010 showed an obstructing approximate 1-2 mm calculus at the left UVJ, and an approximate 1-2 mm left lower pole renal calculus.   Patient had continuing severe pain , and an elevation of his serum creatinine to a value of 1.75 on 06/06/2010.  The stone had apparently passed and was not seen on repeat CT 06/08/2010.  [name]  Headache(784.0)   . Allergic rhinitis   . Neck pain   . Abscess of groin   . Abscessed tooth   . Hypertensive cardiopathy 03/01/2006    2-D echocardiogram 02/01/2012 showed moderate LVH, mildly to moderately reduced left ventricular systolic function with an estimated ejection fraction of 40-45%, and diffuse hypokinesis.  A nuclear medicine stress study done 01/31/2012 showed no reversible ischemia, a small mid anterior wall fixed defect/infarct, and ejection fraction 42%.       Current Outpatient Prescriptions  Medication Sig Dispense Refill  . aspirin 81 MG EC tablet Take 1 tablet (81 mg total) by mouth daily.  30 tablet  6  . atenolol (TENORMIN) 50 MG tablet Take 1 tablet (50 mg total) by mouth daily.  30 tablet  6  . Cetirizine HCl (ZYRTEC ALLERGY) 10 MG CAPS Take 1 capsule (10 mg total) by mouth daily.  30 capsule  6  . furosemide (LASIX) 40 MG tablet Take 1 tablet (40 mg total) by mouth daily.  30 tablet  3  . glucose blood (TRUETRACK TEST) test strip Use as directed to test blood sugar three times a day before meals.  100 each  1  . Insulin Glargine (LANTUS SOLOSTAR) 100 UNIT/ML SOPN Inject 10 Units into the skin at bedtime.  15 mL  3  .  Ipratropium-Albuterol (COMBIVENT) 20-100 MCG/ACT AERS respimat Inhale 1 puff into the lungs every 6 (six) hours as needed for wheezing or shortness of breath.  4 g  6  . Lancets MISC Use to test blood sugar three times a day before meals.  100 each  1  . losartan (COZAAR) 25 MG tablet Take 1 tablet (25 mg total) by mouth daily.  30 tablet  3  . lovastatin (MEVACOR) 40 MG tablet Take 1 tablet (40 mg total) by mouth at bedtime.  30 tablet  6  . metFORMIN (GLUCOPHAGE) 1000 MG tablet Take 1 tablet (1,000 mg total) by mouth 2 (two) times daily with a meal.  60 tablet  6  . [DISCONTINUED] albuterol (PROVENTIL HFA;VENTOLIN HFA) 108 (90 BASE) MCG/ACT inhaler Inhale 1-2 puffs into the lungs every 6 (six) hours as needed for wheezing or shortness of breath.      .  [DISCONTINUED] chlorpheniramine (CHLOR-TRIMETON) 4 MG tablet Take 1 tablet (4 mg total) by mouth 2 (two) times daily as needed for allergies.  30 tablet  0   No current facility-administered medications for this visit.   Family History  Problem Relation Age of Onset  . Breast cancer Mother   . Colon cancer Neg Hx   . Prostate cancer Neg Hx   . Heart attack Neg Hx   . Hypertension Father   . Diabetes Maternal Grandmother    History   Social History  . Marital Status: Single    Spouse Name: N/A    Number of Children: 4 b  . Years of Education: 16   Occupational History  .        Social History Main Topics  . Smoking status: Never Smoker   . Smokeless tobacco: Never Used  . Alcohol Use: No  . Drug Use: No  . Sexual Activity: None   Other Topics Concern  . None   Social History Narrative   Divorced, 4 children, lives alone.  Works for [employer].   Review of Systems: Review of Systems  Constitutional: Negative for fever, chills, weight loss and malaise/fatigue.  HENT: Negative for congestion.   Eyes: Negative for blurred vision.  Respiratory: Negative for cough, sputum production and shortness of breath.   Cardiovascular: Negative for chest pain, palpitations and leg swelling.  Gastrointestinal: Negative for heartburn, nausea, vomiting, abdominal pain, diarrhea and constipation.  Genitourinary: Negative for dysuria.  Skin: Negative for rash.  Neurological: Negative for dizziness and headaches.  Psychiatric/Behavioral: Negative for depression.    Objective:  Physical Exam: Filed Vitals:   02/07/13 0942  BP: 157/96  Pulse: 83  Temp: 97.4 F (36.3 C)  TempSrc: Oral  Height: 6' (1.829 m)  Weight: 294 lb 14.4 oz (133.766 kg)  SpO2: 97%  Physical Exam  Nursing note and vitals reviewed. Constitutional: He is well-developed, well-nourished, and in no distress. No distress.  HENT:  Head: Normocephalic and atraumatic.  Eyes: Conjunctivae are normal.   Cardiovascular: Normal rate, regular rhythm, normal heart sounds and intact distal pulses.   No murmur heard. Pulmonary/Chest: Effort normal and breath sounds normal. No respiratory distress. He has no wheezes. He has no rales.  Abdominal: Soft. Bowel sounds are normal. He exhibits no distension. There is no tenderness.  Musculoskeletal: He exhibits no edema.  Skin: Skin is warm and dry. He is not diaphoretic.  Psychiatric: Affect and judgment normal.     Assessment & Plan:   See Problem Based Assessment and Plan No orders of the defined types  were placed in this encounter.

## 2013-02-07 NOTE — Assessment & Plan Note (Addendum)
Lab Results  Component Value Date   HGBA1C 9.7 01/24/2013   HGBA1C 8.8 10/04/2012   HGBA1C 14.2* 01/31/2012     Assessment: Diabetes control: poor control (HgbA1C >9%) Progress toward A1C goal:  unchanged Comments: Has not started Lantus insulin yet, reports Diet changes since visit with Lupita Leash Pyler last week has brought sugars from average 316 to 250s, did not bring glucometer  Plan: Medications:  Metformin 1g BID, Lantus 10 units QHS Home glucose monitoring: Frequency: 3 times a day Timing: before breakfast;before lunch;before dinner Instruction/counseling given: reminded to get eye exam, reminded to bring blood glucose meter & log to each visit, reminded to bring medications to each visit and discussed foot care Educational resources provided:   Self management tools provided:   Other plans: Lupita Leash Pyler suggested patient may benefit from Metformin ER to increase compliance, patient insisted that he wished to remain on Metformin BID so he could remain disciplined.  He reports improved glucose readings and improved diet since his visit.  He has not started Lantus and I instructed him to do so.  He reports he will bring glucometer to next visit.   [name]

## 2013-02-08 NOTE — Progress Notes (Signed)
I saw and evaluated the patient.  I personally confirmed the key portions of the history and exam documented by Dr. Hoffman and I reviewed pertinent patient test results.  The assessment, diagnosis, and plan were formulated together and I agree with the documentation in the resident's note. 

## 2013-02-15 ENCOUNTER — Other Ambulatory Visit: Payer: Self-pay | Admitting: *Deleted

## 2013-02-15 NOTE — Telephone Encounter (Signed)
Strengths of benicar : they can order any strength but the samples they have 20 and 40, just today met the pharmacist to start MAP, MAP start will probably be in 2 weeks, so today it would need to be 20 and 40.

## 2013-02-15 NOTE — Telephone Encounter (Signed)
[facility] calls to say they cannot get losartan, would you consider changes to benicar

## 2013-02-15 NOTE — Telephone Encounter (Signed)
Please find out which tablet strengths of Benicar are available.

## 2013-02-16 MED ORDER — OLMESARTAN MEDOXOMIL 20 MG PO TABS: 20.0000 mg | ORAL_TABLET | Freq: Every day | ORAL | Status: DC

## 2013-02-16 NOTE — Telephone Encounter (Signed)
Please have him stop losartan and start Benicar 20 mg one tablet daily.

## 2013-02-16 NOTE — Telephone Encounter (Signed)
Called into pharmacy they will explain to pt when he picks up, they had already told him about the change being possible

## 2013-02-22 ENCOUNTER — Ambulatory Visit: Payer: No Typology Code available for payment source | Admitting: Dietician

## 2013-02-22 NOTE — Addendum Note (Signed)
Addended by: Bufford Spikes on: 02/22/2013 01:48 PM   Modules accepted: Orders

## 2013-02-22 NOTE — Addendum Note (Signed)
Addended by: Branna Cortina N on: 02/22/2013 01:48 PM   Modules accepted: Orders  

## 2013-03-09 ENCOUNTER — Ambulatory Visit: Payer: No Typology Code available for payment source

## 2013-03-14 ENCOUNTER — Ambulatory Visit: Payer: No Typology Code available for payment source

## 2013-03-22 ENCOUNTER — Ambulatory Visit: Payer: Self-pay | Admitting: Dietician

## 2013-05-01 ENCOUNTER — Ambulatory Visit: Payer: No Typology Code available for payment source

## 2013-06-04 ENCOUNTER — Encounter (HOSPITAL_COMMUNITY): Payer: Self-pay | Admitting: Emergency Medicine

## 2013-06-04 ENCOUNTER — Emergency Department (INDEPENDENT_AMBULATORY_CARE_PROVIDER_SITE_OTHER)
Admission: EM | Admit: 2013-06-04 | Discharge: 2013-06-04 | Disposition: A | Discharge status: Home / Self Care | Payer: No Typology Code available for payment source | Source: Home / Self Care | Attending: Family Medicine | Admitting: Family Medicine

## 2013-06-04 DIAGNOSIS — L259 Unspecified contact dermatitis, unspecified cause: Secondary | ICD-10-CM

## 2013-06-04 DIAGNOSIS — L309 Dermatitis, unspecified: Secondary | ICD-10-CM

## 2013-06-04 MED ORDER — PREDNISONE 10 MG PO TABS: 30.0000 mg | ORAL_TABLET | Freq: Every day | ORAL | Status: DC

## 2013-06-04 MED ORDER — CROMOLYN SODIUM 4 % OP SOLN: 1.0000 [drp] | Freq: Four times a day (QID) | OPHTHALMIC | Status: DC

## 2013-06-04 NOTE — ED Provider Notes (Signed)
CSN: [id number]     Arrival date & time 06/04/13  1241 History   First MD Initiated Contact with Patient 06/04/13 1534     Chief Complaint  Patient presents with  . Allergies  . Eye Problem   (Consider location/radiation/quality/duration/timing/severity/associated sxs/prior Treatment) HPI Comments: Pt has had problem before, usually takes zyrtec but didn't have any and didn't realize it was OTC.  Using vaseline on eyelids and visine in eyes as eyes have been watering a lot.   Patient is a 58 y.o. male presenting with rash. The history is provided by the patient.  Rash Location:  Face Facial rash location:  R eyelid and L eyelid Quality: dryness, itchiness and swelling   Severity:  Severe Onset quality:  Gradual Duration:  2 weeks Timing:  Constant Progression:  Worsening Chronicity:  Recurrent Relieved by:  Nothing Worsened by:  Nothing tried Ineffective treatments:  Moisturizers and anti-itch cream Associated symptoms: no fever     Past Medical History  Diagnosis Date  . Diabetes mellitus 04/08/2008  . Hypertension   . Hyperlipemia   . Obstructive sleep apnea 03/06/2008    Sleep study 03/06/08 showed severe OSA/hypopnea syndrome, with successful CPAP titration to 13 CWP using a medium ResMed Mirage Quattro full face mask with heated humidifier.   . Cardiomegaly     Mild by CXR 11/04/04  . BPH (benign prostatic hypertrophy)     Massive BPH noted on cystoscopy 1/23/ 2012 by Dr. Risa Grill.  . Renal calculus 05/29/2010    CT scan of abdomen/pelvis on 05/29/2010 showed an obstructing approximate 1-2 mm calculus at the left UVJ, and an approximate 1-2 mm left lower pole renal calculus.   Patient had continuing severe pain , and an elevation of his serum creatinine to a value of 1.75 on 06/06/2010.  The stone had apparently passed and was not seen on repeat CT 06/08/2010.  [name] Headache(784.0)   . Allergic rhinitis   . Neck pain   . Abscess of groin   . Abscessed tooth   . Hypertensive  cardiopathy 03/01/2006    2-D echocardiogram 02/01/2012 showed moderate LVH, mildly to moderately reduced left ventricular systolic function with an estimated ejection fraction of 40-45%, and diffuse hypokinesis.  A nuclear medicine stress study done 01/31/2012 showed no reversible ischemia, a small mid anterior wall fixed defect/infarct, and ejection fraction 42%.       Past Surgical History  Procedure Laterality Date  . Cystoscopy w/ retrogrades     Family History  Problem Relation Age of Onset  . Breast cancer Mother   . Colon cancer Neg Hx   . Prostate cancer Neg Hx   . Heart attack Neg Hx   . Hypertension Father   . Diabetes Maternal Grandmother    History  Substance Use Topics  . Smoking status: Never Smoker   . Smokeless tobacco: Never Used  . Alcohol Use: No    Review of Systems  Constitutional: Negative for fever and chills.  Eyes: Positive for discharge and itching. Negative for photophobia, pain, redness and visual disturbance.       Discharge is watery and not purulent.   Skin: Positive for rash.    Allergies  Lisinopril  Home Medications   Current Outpatient Rx  Name  Route  Sig  Dispense  Refill  . atenolol (TENORMIN) 50 MG tablet   Oral   Take 1 tablet (50 mg total) by mouth daily.   30 tablet   6   . Insulin Glargine (  LANTUS SOLOSTAR) 100 UNIT/ML SOPN   Subcutaneous   Inject 10 Units into the skin at bedtime.   15 mL   3   . metFORMIN (GLUCOPHAGE) 1000 MG tablet   Oral   Take 1 tablet (1,000 mg total) by mouth 2 (two) times daily with a meal.   60 tablet   6   . aspirin 81 MG EC tablet   Oral   Take 1 tablet (81 mg total) by mouth daily.   30 tablet   6   . Cetirizine HCl (ZYRTEC ALLERGY) 10 MG CAPS   Oral   Take 1 capsule (10 mg total) by mouth daily.   30 capsule   6   . cromolyn (OPTICROM) 4 % ophthalmic solution   Both Eyes   Place 1-2 drops into both eyes 4 (four) times daily.   10 mL   0   . furosemide (LASIX) 40 MG tablet    Oral   Take 1 tablet (40 mg total) by mouth daily.   30 tablet   3   . glucose blood (TRUETRACK TEST) test strip      Use as directed to test blood sugar three times a day before meals.   100 each   1   . Ipratropium-Albuterol (COMBIVENT) 20-100 MCG/ACT AERS respimat   Inhalation   Inhale 1 puff into the lungs every 6 (six) hours as needed for wheezing or shortness of breath.   4 g   6   . Lancets MISC      Use to test blood sugar three times a day before meals.   100 each   1   . lovastatin (MEVACOR) 40 MG tablet   Oral   Take 1 tablet (40 mg total) by mouth at bedtime.   30 tablet   6     Stop pravastatin.   [name] olmesartan (BENICAR) 20 MG tablet   Oral   Take 1 tablet (20 mg total) by mouth daily.   30 tablet   3     Stop losartan.   . predniSONE (DELTASONE) 10 MG tablet   Oral   Take 3 tablets (30 mg total) by mouth daily.   9 tablet   0    BP 153/96  Pulse 71  Temp(Src) 98.2 F (36.8 C) (Oral)  Resp 16  SpO2 98% Physical Exam  Constitutional: He appears well-developed and well-nourished. No distress.  Eyes: Conjunctivae are normal. Pupils are equal, round, and reactive to light. Right eye exhibits no discharge, no exudate and no hordeolum. Left eye exhibits no discharge, no exudate and no hordeolum.  Skin of B eyelids as per skin exam.   Skin: Skin is warm and dry. Rash noted. Rash is macular.  Scaling discolored skin of B eyelids c/w eczema. Eyelids slightly swollen. No erythema, warmth.     ED Course  Procedures (including critical care time) Labs Review Labs Reviewed - No data to display Imaging Review No results found.  EKG Interpretation    Date/Time:    Ventricular Rate:    PR Interval:    QRS Duration:   QT Interval:    QTC Calculation:   R Axis:     Text Interpretation:              MDM   1. Eczema   pt to purchase and use zyrtec daily until this is healed. Use otc hydrocortisone BID until healing. Use lotion or  vaseline or other on eyelids several times  per day. Rx prednisone 30mg  daily for 3 days. Rx cromolyn ophthalmic 1-2 drops in both eyes QID.  F/u with pcp.     Carvel Getting, NP 06/04/13 [phone]

## 2013-06-04 NOTE — ED Notes (Signed)
Reports having allergies.  Eyes are very itchy and watery.  Some puffiness from rubbing eyes.  Denies vision problems and any other symptoms.   otc eye drops used with no relief.  Symptoms present x 2 wks.

## 2013-06-04 NOTE — Discharge Instructions (Signed)
Purchase zyrtec and take daily until this is completely healed. Use the hydrocortisone cream on your eyelids twice daily until this is improving. In between applications, use lotion or vaseline or similar to keep the skin covered and hydrated.     Allergies  Allergies may happen from anything your body is sensitive to. This may be food, medicines, pollens, chemicals, and many other things. Food allergies can be severe and deadly.  HOME CARE  If you do not know what causes a reaction, keep a diary. Write down the foods you ate and the symptoms that followed. Avoid foods that cause reactions.  If you have red raised spots (hives) or a rash:  Take medicine as told by your doctor.  Use medicines for red raised spots and itching as needed.  Apply cold cloths (compresses) to the skin. Take a cool bath. Avoid hot baths or showers.  If you are severely allergic:  It is often necessary to go to the hospital after you have treated your reaction.  Wear your medical alert jewelry.  You and your family must learn how to give a allergy shot or use an allergy kit (anaphylaxis kit).  Always carry your allergy kit or shot with you. Use this medicine as told by your doctor if a severe reaction is occurring. GET HELP RIGHT AWAY IF:  You have trouble breathing or are making high-pitched whistling sounds (wheezing).  You have a tight feeling in your chest or throat.  You have a puffy (swollen) mouth.  You have red raised spots, puffiness (swelling), or itching all over your body.  You have had a severe reaction that was helped by your allergy kit or shot. The reaction can return once the medicine has worn off.  You think you are having a food allergy. Symptoms most often happen within 30 minutes of eating a food.  Your symptoms have not gone away within 2 days or are getting worse.  You have new symptoms.  You want to retest yourself with a food or drink you think causes an allergic reaction.  Only do this under the care of a doctor. MAKE SURE YOU:   Understand these instructions.  Will watch your condition.  Will get help right away if you are not doing well or get worse. Document Released: 08/28/2012 Document Reviewed: 08/28/2012 [facility] Patient Information 2014 [place], [place].

## 2013-06-05 NOTE — ED Provider Notes (Signed)
Medical screening examination/treatment/procedure(s) were performed by resident physician or non-physician practitioner and as supervising physician I was immediately available for consultation/collaboration.   Pauline Good MD.   Billy Fischer, MD 06/05/13 1409

## 2013-06-27 ENCOUNTER — Ambulatory Visit: Payer: Self-pay | Admitting: Internal Medicine

## 2013-09-11 ENCOUNTER — Other Ambulatory Visit: Payer: Self-pay | Admitting: *Deleted

## 2013-09-11 NOTE — Telephone Encounter (Signed)
Patient has apparently not been seen here since September of 2014 and had not yet started taking Lantus insulin at the time of that visit.  Please find out if he is currently taking insulin and if so, how much.  He needs to be seen in clinic ASAP; do we have openings this week?

## 2013-09-11 NOTE — Telephone Encounter (Signed)
Pt scheduled for Friday with Dr Eyvonne Mechanic.  He states he has been on insulin for awhile.

## 2013-09-11 NOTE — Telephone Encounter (Signed)
Please confirm dose.Pt states it has changed

## 2013-09-12 NOTE — Telephone Encounter (Signed)
How much Lantus is he currently taking, and is he out of insulin at this time or does he have enough to last until he is seen on Friday?

## 2013-09-13 NOTE — Telephone Encounter (Signed)
Pt has enough to last until Friday.  Will refill at that visit.

## 2013-09-14 ENCOUNTER — Ambulatory Visit (INDEPENDENT_AMBULATORY_CARE_PROVIDER_SITE_OTHER): Payer: No Typology Code available for payment source | Admitting: Internal Medicine

## 2013-09-14 ENCOUNTER — Encounter: Payer: Self-pay | Admitting: Internal Medicine

## 2013-09-14 VITALS — BP 158/97 | HR 73 | Temp 96.2°F | Ht 73.0 in | Wt 297.7 lb

## 2013-09-14 DIAGNOSIS — E118 Type 2 diabetes mellitus with unspecified complications: Secondary | ICD-10-CM

## 2013-09-14 DIAGNOSIS — I10 Essential (primary) hypertension: Secondary | ICD-10-CM

## 2013-09-14 DIAGNOSIS — E785 Hyperlipidemia, unspecified: Secondary | ICD-10-CM

## 2013-09-14 DIAGNOSIS — IMO0002 Reserved for concepts with insufficient information to code with codable children: Secondary | ICD-10-CM

## 2013-09-14 DIAGNOSIS — E1165 Type 2 diabetes mellitus with hyperglycemia: Secondary | ICD-10-CM

## 2013-09-14 DIAGNOSIS — I5022 Chronic systolic (congestive) heart failure: Secondary | ICD-10-CM

## 2013-09-14 DIAGNOSIS — T7840XA Allergy, unspecified, initial encounter: Secondary | ICD-10-CM

## 2013-09-14 LAB — GLUCOSE, CAPILLARY: Glucose-Capillary: 288 mg/dL — ABNORMAL HIGH (ref 70–99)

## 2013-09-14 LAB — POCT GLYCOSYLATED HEMOGLOBIN (HGB A1C)

## 2013-09-14 MED ORDER — OLMESARTAN MEDOXOMIL 20 MG PO TABS: 20.0000 mg | ORAL_TABLET | Freq: Every day | ORAL | Status: DC

## 2013-09-14 MED ORDER — LOVASTATIN 40 MG PO TABS: 40.0000 mg | ORAL_TABLET | Freq: Every day | ORAL | Status: DC

## 2013-09-14 MED ORDER — CROMOLYN SODIUM 4 % OP SOLN: 1.0000 [drp] | Freq: Four times a day (QID) | OPHTHALMIC | Status: DC

## 2013-09-14 MED ORDER — PRAVASTATIN SODIUM 40 MG PO TABS: ORAL_TABLET | ORAL | Status: DC

## 2013-09-14 MED ORDER — FUROSEMIDE 40 MG PO TABS: 40.0000 mg | ORAL_TABLET | Freq: Every day | ORAL | Status: DC

## 2013-09-14 MED ORDER — LANCETS MISC: Status: DC

## 2013-09-14 MED ORDER — INSULIN GLARGINE 100 UNIT/ML SOLOSTAR PEN: 15.0000 [IU] | PEN_INJECTOR | Freq: Every day | SUBCUTANEOUS | Status: DC

## 2013-09-14 MED ORDER — METFORMIN HCL 1000 MG PO TABS: 1000.0000 mg | ORAL_TABLET | Freq: Two times a day (BID) | ORAL | Status: DC

## 2013-09-14 MED ORDER — ATENOLOL 50 MG PO TABS: 50.0000 mg | ORAL_TABLET | Freq: Every day | ORAL | Status: DC

## 2013-09-14 NOTE — Patient Instructions (Signed)
Start taking Lantus Insulin 15 units subcutaneously once daily at bedtime. Take all the medications as recommended below. Follow up with our clinic in 2 weeks. Check your blood sugar every day before eating breakfast. Bring your glucometer to your next office visit.

## 2013-09-14 NOTE — Progress Notes (Signed)
Case discussed with Dr. Boggala at the time of the visit.  We reviewed the resident's history and exam and pertinent patient test results.  I agree with the assessment, diagnosis, and plan of care documented in the resident's note. 

## 2013-09-14 NOTE — Assessment & Plan Note (Signed)
Worsening of leg edema secondary to Lasix non-compliance Patient currently has 2+ pitting edema bilaterally.  Patient is not c/o SOB or DOE.  Plans Refill Lasix Follow up in 2 weeks.

## 2013-09-14 NOTE — Progress Notes (Signed)
Subjective:   Patient ID: Terry Parks male   DOB: [birth year] 58 y.o.   MRN: [id number]  HPI: Terry Parks is a 58 y.o. with PMH significant for DM-II, HTN, systolic CHF with LVEF 69-48% (sept 2013) comes to the office for the refill of his Lantus.  [facility] received prior authorization request from [place] for refill of Lantus. As the patient's last HbA1C was from September 2014, patient was asked for an office visit before the refill.  Patient brings his glucometer and his medication bottles with him today. His bottles included only Metformin, Atenolol, ASA, Lantus. Patient reports he ran out of his lasix about a month ago and he hasn't been taking his Benicar and Mevacor in almost 6 months. Patient is asking for a refill of all his medications.  Patient reports that he hasn't been watching his diet lately and has been consuming lot of calories. He reports checking his blood sugars only once a day before breakfast. He states that he gets only 30 strips per month from the [facility] and he can only once a day. Patients glucometer has only 6 readings from 09/01/13 to 09/14/13 and he states all of them are fasting. His six readings are 333,355,494,358,385,357.  Patient complains of occasional pins and needle sensation in his feet and hands for the last few months and is asking for treatment. He denies any tingling or numbness or weakness in the extremities.  He denies any other complaints during this office visit.    Past Medical History  Diagnosis Date  . Diabetes mellitus 04/08/2008  . Hypertension   . Hyperlipemia   . Obstructive sleep apnea 03/06/2008    Sleep study 03/06/08 showed severe OSA/hypopnea syndrome, with successful CPAP titration to 13 CWP using a medium ResMed Mirage Quattro full face mask with heated humidifier.   . Cardiomegaly     Mild by CXR 11/04/04  . BPH (benign prostatic hypertrophy)     Massive BPH noted on cystoscopy 1/23/  2012 by Dr. Risa Grill.  . Renal calculus 05/29/2010    CT scan of abdomen/pelvis on 05/29/2010 showed an obstructing approximate 1-2 mm calculus at the left UVJ, and an approximate 1-2 mm left lower pole renal calculus.   Patient had continuing severe pain , and an elevation of his serum creatinine to a value of 1.75 on 06/06/2010.  The stone had apparently passed and was not seen on repeat CT 06/08/2010.  [name] Headache(784.0)   . Allergic rhinitis   . Neck pain   . Abscess of groin   . Abscessed tooth   . Hypertensive cardiopathy 03/01/2006    2-D echocardiogram 02/01/2012 showed moderate LVH, mildly to moderately reduced left ventricular systolic function with an estimated ejection fraction of 40-45%, and diffuse hypokinesis.  A nuclear medicine stress study done 01/31/2012 showed no reversible ischemia, a small mid anterior wall fixed defect/infarct, and ejection fraction 42%.       Current Outpatient Prescriptions  Medication Sig Dispense Refill  . aspirin 81 MG EC tablet Take 1 tablet (81 mg total) by mouth daily.  30 tablet    . atenolol (TENORMIN) 50 MG tablet Take 1 tablet (50 mg total) by mouth daily.  30 tablet    . cromolyn (OPTICROM) 4 % ophthalmic solution Place 1-2 drops into both eyes 4 (four) times daily.  10 mL  1  . furosemide (LASIX) 40 MG tablet Take 1 tablet (40 mg total) by mouth daily.  Not taking last 1 month.   [name]  glucose blood (TRUETRACK TEST) test strip Use as directed to test blood sugar three times a day before meals.  100 each  1  . Insulin Glargine (LANTUS SOLOSTAR) 100 UNIT/ML Solostar Pen Inject 10 Units into the skin at bedtime.  10 mL    . Lancets MISC Use to test blood sugar three times a day before meals.  100 each  1  . metFORMIN (GLUCOPHAGE) 1000 MG tablet Take 1 tablet (1,000 mg total) by mouth 2 (two) times daily with a meal.      . olmesartan (BENICAR) 20 MG tablet Take 1 tablet (20 mg total) by mouth daily.  Not taking last 6 months    . pravastatin (PRAVACHOL) 40  MG tablet Take 1 tablet once daily at bedtime.  Not taking last 6 months.    . [DISCONTINUED] albuterol (PROVENTIL HFA;VENTOLIN HFA) 108 (90 BASE) MCG/ACT inhaler Inhale 1-2 puffs into the lungs every 6 (six) hours as needed for wheezing or shortness of breath.  never refilled it because of the cost.    . [DISCONTINUED] chlorpheniramine (CHLOR-TRIMETON) 4 MG tablet Take 1 tablet (4 mg total) by mouth 2 (two) times daily as needed for allergies.  finished the course.  0   No current facility-administered medications for this visit.   Family History  Problem Relation Age of Onset  . Breast cancer Mother   . Colon cancer Neg Hx   . Prostate cancer Neg Hx   . Heart attack Neg Hx   . Hypertension Father   . Diabetes Maternal Grandmother    History   Social History  . Marital Status: Single    Spouse Name: N/A    Number of Children: 4 b  . Years of Education: 16   Occupational History  .        Social History Main Topics  . Smoking status: Never Smoker   . Smokeless tobacco: Never Used  . Alcohol Use: No  . Drug Use: No  . Sexual Activity: Not Currently   Other Topics Concern  . Not on file   Social History Narrative   Divorced, 4 children, lives alone.  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   Review of Systems: Pertinent items are noted in HPI. Objective:  Physical Exam: Filed Vitals:   09/14/13 0943  BP: 158/97  Pulse: 73  Temp: 96.2 F (35.7 C)  TempSrc: Oral  Height: 6\' 1"  (1.854 m)  Weight: 297 lb 11.2 oz (135.036 kg)  SpO2: 99%   Constitutional: Vital signs reviewed.   Patient is a well-developed and well-nourished and is in no acute distress and cooperative with exam. Alert and oriented x3.  Head: Normocephalic and atraumatic Mouth: no erythema or exudates, MMM Eyes: PERRL, EOMI, conjunctivae normal, No scleral icterus.  Neck: Supple, Trachea midline normal ROM, No JVD, mass, thyromegaly, or carotid bruit present.  Cardiovascular: RRR, S1 normal, S2 normal,  no MRG. Pulmonary/Chest: normal respiratory effort, CTAB. Abdominal: Soft. Non-tender, non-distended, bowel sounds are normal, no masses, organomegaly, or guarding present.  Musculoskeletal: No joint deformities. 2+ Bilateral pitting edema noted in both LE from ankles to knees. Neurological: A&O x3, Strength is normal and symmetric bilaterally. Sensations to light touch intact and equal in the all the extremities.  Skin: Warm, dry and intact. No rash, cyanosis, or clubbing.  Psychiatric: Normal mood and affect.   Assessment & Plan:

## 2013-09-14 NOTE — Assessment & Plan Note (Signed)
Poorly controlled DM with HbA1C of >14.0 today. Patient admits to not watching diet and calories since his last office visit. Educated patient regarding the importance of dietary changes and the importance of better control of DM. Recommended patient closer follow ups while we uptitrate the insulin and patient is agreeable to the plan. Discussed with the attending Dr. Ellwood Dense regarding further management and plan.  Plans: Increase his Lantus to 15 units qhs. Check daily fasting blood sugars as he cannot do more frequently. Follow up in the clinic in 2 weeks. Motivated patient to reduce the carbohydrate and calorie intake. Continue Metformin 1000 mg BID

## 2013-09-14 NOTE — Assessment & Plan Note (Signed)
Elevated secondary to medication non-compliance. Patient admits to not taking his Lasix for the last one month and Benicar for the last 6 months.  Plans: Refilled all his BP medications. Will recheck his BP in 2 weeks when he follows up for his DM.

## 2013-09-14 NOTE — Assessment & Plan Note (Signed)
Non-compliant to his statins in the last six months. Received a call from [facility] and that they don't carry Lovastatin. Spoke with the pharmacist.  Plans: Change Lovastatin to pravastatin. Pravastatin 40 mg qhs

## 2013-09-28 ENCOUNTER — Encounter: Payer: Self-pay | Admitting: Internal Medicine

## 2013-09-28 ENCOUNTER — Ambulatory Visit (INDEPENDENT_AMBULATORY_CARE_PROVIDER_SITE_OTHER): Payer: No Typology Code available for payment source | Admitting: Internal Medicine

## 2013-09-28 VITALS — BP 151/91 | HR 66 | Temp 97.1°F | Ht 72.0 in | Wt 298.7 lb

## 2013-09-28 DIAGNOSIS — I10 Essential (primary) hypertension: Secondary | ICD-10-CM

## 2013-09-28 DIAGNOSIS — I5022 Chronic systolic (congestive) heart failure: Secondary | ICD-10-CM

## 2013-09-28 DIAGNOSIS — E118 Type 2 diabetes mellitus with unspecified complications: Principal | ICD-10-CM

## 2013-09-28 DIAGNOSIS — E1165 Type 2 diabetes mellitus with hyperglycemia: Secondary | ICD-10-CM

## 2013-09-28 DIAGNOSIS — IMO0002 Reserved for concepts with insufficient information to code with codable children: Secondary | ICD-10-CM

## 2013-09-28 LAB — GLUCOSE, CAPILLARY: GLUCOSE-CAPILLARY: 262 mg/dL — AB (ref 70–99)

## 2013-09-28 MED ORDER — INSULIN GLARGINE 100 UNIT/ML SOLOSTAR PEN: 20.0000 [IU] | PEN_INJECTOR | Freq: Every day | SUBCUTANEOUS | Status: DC

## 2013-09-28 NOTE — Progress Notes (Signed)
Case discussed with Dr. Boggala at the time of the visit.  We reviewed the resident's history and exam and pertinent patient test results.  I agree with the assessment, diagnosis, and plan of care documented in the resident's note. 

## 2013-09-28 NOTE — Progress Notes (Signed)
Subjective:   Patient ID: Terry Parks male   DOB: 11/01/1955 58 y.o.   MRN: [id number]  HPI: Terry Parks is a 58 y.o. with PMH significant for DM-II, HTN, systolic CHF with LVEF 78-29% (sept 2013) comes to the office for a follow up of hid DM.  I saw Terry Parks on 09/14/2013 when his A1C was found to be >14.0. His Lantus was increased to 15 units from 10 and was recommended a 2 week follow up. Patient was also recommended to watch his diet and restrict carbohydrate and calorie intake. All his medications were refilled that day.  Patient reports that he has been taking all his medications except Benicar and Pravachol which he still hasn't picked up from the pharmacy. He reports compliance to all the other medications. He reports he forgot to bring his glucometer today but states that his fasting blood sugars are in "low 300's. He states that the lowest fasting sugar value was 309 and the highest was 411. He states that he is watching his diet now.  He denies any complaints during this office visit.    Past Medical History  Diagnosis Date  . Diabetes mellitus 04/08/2008  . Hypertension   . Hyperlipemia   . Obstructive sleep apnea 03/06/2008    Sleep study 03/06/08 showed severe OSA/hypopnea syndrome, with successful CPAP titration to 13 CWP using a medium ResMed Mirage Quattro full face mask with heated humidifier.   . Cardiomegaly     Mild by CXR 11/04/04  . BPH (benign prostatic hypertrophy)     Massive BPH noted on cystoscopy 1/23/ 2012 by Dr. Risa Grill.  . Renal calculus 05/29/2010    CT scan of abdomen/pelvis on 05/29/2010 showed an obstructing approximate 1-2 mm calculus at the left UVJ, and an approximate 1-2 mm left lower pole renal calculus.   Patient had continuing severe pain , and an elevation of his serum creatinine to a value of 1.75 on 06/06/2010.  The stone had apparently passed and was not seen on repeat CT 06/08/2010.  [name] Headache(784.0)   . Allergic rhinitis   .  Neck pain   . Abscess of groin   . Abscessed tooth   . Hypertensive cardiopathy 03/01/2006    2-D echocardiogram 02/01/2012 showed moderate LVH, mildly to moderately reduced left ventricular systolic function with an estimated ejection fraction of 40-45%, and diffuse hypokinesis.  A nuclear medicine stress study done 01/31/2012 showed no reversible ischemia, a small mid anterior wall fixed defect/infarct, and ejection fraction 42%.       Current Outpatient Prescriptions  Medication Sig Dispense Refill  . aspirin 81 MG EC tablet Take 1 tablet (81 mg total) by mouth daily.  30 tablet  6  . atenolol (TENORMIN) 50 MG tablet Take 1 tablet (50 mg total) by mouth daily.  30 tablet  6  . cromolyn (OPTICROM) 4 % ophthalmic solution Place 1-2 drops into both eyes 4 (four) times daily.  10 mL  1  . furosemide (LASIX) 40 MG tablet Take 1 tablet (40 mg total) by mouth daily.  30 tablet  3  . glucose blood (TRUETRACK TEST) test strip Use as directed to test blood sugar three times a day before meals.  100 each  1  . Insulin Glargine (LANTUS SOLOSTAR) 100 UNIT/ML Solostar Pen Inject 15 Units into the skin at bedtime.  15 mL  3  . Lancets MISC Use to test blood sugar three times a day before meals.  100 each  1  .  metFORMIN (GLUCOPHAGE) 1000 MG tablet Take 1 tablet (1,000 mg total) by mouth 2 (two) times daily with a meal.  60 tablet  6  . olmesartan (BENICAR) 20 MG tablet Take 1 tablet (20 mg total) by mouth daily.  30 tablet  3  . pravastatin (PRAVACHOL) 40 MG tablet Take 1 tablet once daily at bedtime.  30 tablet  5  . [DISCONTINUED] albuterol (PROVENTIL HFA;VENTOLIN HFA) 108 (90 BASE) MCG/ACT inhaler Inhale 1-2 puffs into the lungs every 6 (six) hours as needed for wheezing or shortness of breath.      . [DISCONTINUED] chlorpheniramine (CHLOR-TRIMETON) 4 MG tablet Take 1 tablet (4 mg total) by mouth 2 (two) times daily as needed for allergies.  30 tablet  0   No current facility-administered medications for  this visit.   Family History  Problem Relation Age of Onset  . Breast cancer Mother   . Colon cancer Neg Hx   . Prostate cancer Neg Hx   . Heart attack Neg Hx   . Hypertension Father   . Diabetes Maternal Grandmother    History   Social History  . Marital Status: Single    Spouse Name: N/A    Number of Children: 4 b  . Years of Education: 16   Occupational History  .        Social History Main Topics  . Smoking status: Never Smoker   . Smokeless tobacco: Never Used  . Alcohol Use: No  . Drug Use: No  . Sexual Activity: Not Currently   Other Topics Concern  . Not on file   Social History Narrative   Divorced, 4 children, lives alone.  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   Review of Systems: Pertinent items are noted in HPI. Objective:  Physical Exam: There were no vitals filed for this visit.  BP 151/91 Temp 97.1 pULSE 66 O2: 100% RA  Constitutional: Vital signs reviewed.  Patient is a well-developed and well-nourished and is in no acute distress and cooperative with exam. Alert and oriented x3.  Head: Normocephalic and atraumatic  Cardiovascular: RRR, S1 normal, S2 normal, no MRG.  Pulmonary/Chest: normal respiratory effort, CTAB.  Musculoskeletal: 1+ Bilateral pitting edema noted in both LE from ankles to knees. Neurological: A&O x3 Skin: Warm, dry and intact. Psychiatric: Normal mood and affect.     Assessment & Plan:

## 2013-09-28 NOTE — Assessment & Plan Note (Signed)
Clinically stable. Edema in the legs is improving after restarting his Lasix. Still not taking his ARB but taking BB.  Plans: Continue current medications. He is to pick his ARB today and start taking. Follow up in a month for DM. Will check BMP at the next office visit.

## 2013-09-28 NOTE — Patient Instructions (Signed)
Start taking Lantus Insulin 20 units at bedtime from today. Pick up the Benicar and Pravachol from the pharmacy and start taking them from today. Check your fasting blood sugars every day and bring your glucometer to your next office appointment. Follow up in a month.

## 2013-09-28 NOTE — Assessment & Plan Note (Signed)
Uncontrolled with an A1C of >14.0 Per patient, his fasting blood sugars are in "low 300".  Fasting blood sugar in the clinic today is 262.  Plans: Increase Lantus to 20 units qhs Continue Metformin current strength. Encouraged to restrict his carbohydrate and calorie intake. Follow up in a month. Check fasting blood sugars daily and to bring his glucometer all the office visits.

## 2013-09-28 NOTE — Assessment & Plan Note (Signed)
Slightly elevated and not at goal. Patient not taking Benicar currently.  Plans: Continue current regimen. He is to pick up Benicar today and start taking. Follow up in a month for DM.

## 2013-10-19 ENCOUNTER — Ambulatory Visit: Payer: No Typology Code available for payment source

## 2013-10-19 ENCOUNTER — Ambulatory Visit (INDEPENDENT_AMBULATORY_CARE_PROVIDER_SITE_OTHER): Payer: No Typology Code available for payment source | Admitting: Internal Medicine

## 2013-10-19 ENCOUNTER — Encounter: Payer: Self-pay | Admitting: Internal Medicine

## 2013-10-19 VITALS — BP 169/98 | HR 78 | Temp 97.3°F | Ht 72.0 in | Wt 304.0 lb

## 2013-10-19 DIAGNOSIS — E1165 Type 2 diabetes mellitus with hyperglycemia: Secondary | ICD-10-CM

## 2013-10-19 DIAGNOSIS — E118 Type 2 diabetes mellitus with unspecified complications: Principal | ICD-10-CM

## 2013-10-19 DIAGNOSIS — I10 Essential (primary) hypertension: Secondary | ICD-10-CM

## 2013-10-19 DIAGNOSIS — R21 Rash and other nonspecific skin eruption: Secondary | ICD-10-CM

## 2013-10-19 DIAGNOSIS — IMO0002 Reserved for concepts with insufficient information to code with codable children: Secondary | ICD-10-CM

## 2013-10-19 LAB — GLUCOSE, CAPILLARY: GLUCOSE-CAPILLARY: 191 mg/dL — AB (ref 70–99)

## 2013-10-19 MED ORDER — INSULIN GLARGINE 100 UNIT/ML SOLOSTAR PEN: PEN_INJECTOR | SUBCUTANEOUS | Status: DC

## 2013-10-19 MED ORDER — HYDROXYZINE HCL 25 MG PO TABS: ORAL_TABLET | ORAL | Status: DC

## 2013-10-19 NOTE — Assessment & Plan Note (Signed)
Diffuse macuopapular rash with prominent scaling noted over the upper chest, abdomen, groin, penis, scrotum, anterior/posterior aspect of both legs, anterior/posterior aspect of upper extremities, back. The rash is more prominent in the anterior aspect of the body than the posterior aspect and appears to concentrate towards to the midabdomen and decrease towards upper chest and towards distal extremtities. The rash appears to be in resolution phase with very few areas appearing slightly erythematous. Scratch marks noted over few locations. Suspect secondary to chemical dermatitis (used unknown new detergent to wash his clothes) vs allergic contact dermatitis vs psoriasis (but scaling in this patient appears to be from resolution of the rash rather than from primary scaling as seen in hyperkeratosis and scaling of psoriasis) vs tinea corporis (the lesions do not appear tinea lesions and the KOH preparation of the skin scrapings is negative for any fungal hyphae). Discussed with the attending regarding further management and plan.  Plans: As the rash is in resolution phase as are his symptoms, will do symptomatic therapy with Hydroxyzine 25 mg TID along with moisturizing lotion to be applied over the lesions twice daily. Follow up in a week. Recommended to call the clinic if the lesions are worsening or if he notices any further symptoms. If the lesions persist or worsening, consider dermatology consult next week.

## 2013-10-19 NOTE — Progress Notes (Signed)
Subjective:   Patient ID: Terry Parks male   DOB: [birth year] 58 y.o.   MRN: [id number]  HPI: [name] is a 58 y.o. [name] is a 58 y.o. with PMH significant for DM-II, HTN, systolic CHF with LVEF 62-83% (sept 2013) comes to the office for a follow up of his DM and a new onset rash of 2 weeks duration.   I saw [name] on 09/14/2013 when his A1C was found to be >14.0. His Lantus was increased to 15 units from 10 and was recommended a 2 week follow up. I saw him again on 09/28/13 and his Lantus was further increased to 20 units and recommended another 2 week follow up. Patient was also recommended to watch his diet and restrict carbohydrate and calorie intake.   Patient reports that he has been taking all his medications except Benicar which he still hasn't picked up from the pharmacy. He bring his glucometer to the clinic today and he states that all the number are fasting blood sugars. They are numbers in 200's, 300's and one value of 463 and another value of 531. Patient reports compliance to his medical regimen but states that he missed his last night dose.  Patient reports that he had noticed itching and rash in the groin area about two weeks ago, then the rash spread all over the body involving his legs from gluteal areas to the middle of the legs, groin area, over the abdomen, over the back, over the upper extremities all the way up to upper arms. He reports the rash started small red "bumps" that are very itchy. He used OTC Vaseline and "few" clindamycin tablets that her "friend" gave him. He reports that rash and the itching is getting better. He reports that rash started after he washed his clothes with a "new detergent" that he borrowed from others. He does not remember the name of the detergent but states that he never used that before. He stated that the rash started after wearing those clothes.   He denies any complaints during this office visit.    Past  Medical History  Diagnosis Date  . Diabetes mellitus 04/08/2008  . Hypertension   . Hyperlipemia   . Obstructive sleep apnea 03/06/2008    Sleep study 03/06/08 showed severe OSA/hypopnea syndrome, with successful CPAP titration to 13 CWP using a medium ResMed Mirage Quattro full face mask with heated humidifier.   . Cardiomegaly     Mild by CXR 11/04/04  . BPH (benign prostatic hypertrophy)     Massive BPH noted on cystoscopy 1/23/ 2012 by Dr. Risa Grill.  . Renal calculus 05/29/2010    CT scan of abdomen/pelvis on 05/29/2010 showed an obstructing approximate 1-2 mm calculus at the left UVJ, and an approximate 1-2 mm left lower pole renal calculus.   Patient had continuing severe pain , and an elevation of his serum creatinine to a value of 1.75 on 06/06/2010.  The stone had apparently passed and was not seen on repeat CT 06/08/2010.  [name] Headache(784.0)   . Allergic rhinitis   . Neck pain   . Abscess of groin   . Abscessed tooth   . Hypertensive cardiopathy 03/01/2006    2-D echocardiogram 02/01/2012 showed moderate LVH, mildly to moderately reduced left ventricular systolic function with an estimated ejection fraction of 40-45%, and diffuse hypokinesis.  A nuclear medicine stress study done 01/31/2012 showed no reversible ischemia, a small mid anterior wall fixed defect/infarct, and ejection fraction 42%.  Current Outpatient Prescriptions  Medication Sig Dispense Refill  . aspirin 81 MG EC tablet Take 1 tablet (81 mg total) by mouth daily.  30 tablet  6  . atenolol (TENORMIN) 50 MG tablet Take 1 tablet (50 mg total) by mouth daily.  30 tablet  6  . cromolyn (OPTICROM) 4 % ophthalmic solution Place 1-2 drops into both eyes 4 (four) times daily.  10 mL  1  . furosemide (LASIX) 40 MG tablet Take 1 tablet (40 mg total) by mouth daily.  30 tablet  3  . glucose blood (TRUETRACK TEST) test strip Use as directed to test blood sugar three times a day before meals.  100 each  1  . Insulin Glargine  (LANTUS SOLOSTAR) 100 UNIT/ML Solostar Pen Inject 20 Units into the skin at bedtime.  15 mL  3  . Lancets MISC Use to test blood sugar three times a day before meals.  100 each  1  . metFORMIN (GLUCOPHAGE) 1000 MG tablet Take 1 tablet (1,000 mg total) by mouth 2 (two) times daily with a meal.  60 tablet  6  . olmesartan (BENICAR) 20 MG tablet Take 1 tablet (20 mg total) by mouth daily.  30 tablet  3  . pravastatin (PRAVACHOL) 40 MG tablet Take 1 tablet once daily at bedtime.  30 tablet  5  . [DISCONTINUED] albuterol (PROVENTIL HFA;VENTOLIN HFA) 108 (90 BASE) MCG/ACT inhaler Inhale 1-2 puffs into the lungs every 6 (six) hours as needed for wheezing or shortness of breath.      . [DISCONTINUED] chlorpheniramine (CHLOR-TRIMETON) 4 MG tablet Take 1 tablet (4 mg total) by mouth 2 (two) times daily as needed for allergies.  30 tablet  0   No current facility-administered medications for this visit.   Family History  Problem Relation Age of Onset  . Breast cancer Mother   . Colon cancer Neg Hx   . Prostate cancer Neg Hx   . Heart attack Neg Hx   . Hypertension Father   . Diabetes Maternal Grandmother    History   Social History  . Marital Status: Single    Spouse Name: N/A    Number of Children: 4 b  . Years of Education: 16   Occupational History  .        Social History Main Topics  . Smoking status: Never Smoker   . Smokeless tobacco: Never Used  . Alcohol Use: No  . Drug Use: No  . Sexual Activity: Not Currently   Other Topics Concern  . None   Social History Narrative   Divorced, 4 children, lives alone.  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   Review of Systems: Pertinent items are noted in HPI. Objective:  Physical Exam: Filed Vitals:   10/19/13 1028  BP: 169/98  Pulse: 78  Temp: 97.3 F (36.3 C)  TempSrc: Oral  Height: 6' (1.829 m)  Weight: 304 lb (137.893 kg)    Patient is a well-developed and well-nourished and is in no acute distress and cooperative with  exam. Alert and oriented x3.  Cardiovascular: RRR, S1 normal, S2 normal, no MRG.  Pulmonary/Chest: normal respiratory effort, CTAB.  Musculoskeletal: No pedal edema. Neurological: A&O x3  Skin:  Diffuse macuopapular rash with prominent scaling noted over the upper chest, abdomen, groin, penis, scrotum, anterior/posterior aspect of both legs, anterior/posterior aspect of upper extremities, back. The rash is more prominent in the anterior aspect of the body than the posterior aspect and appears to concentrate towards to the  midabdomen and decrease towards upper chest and towards distal extremtities. The rash appears to be in resolution phase with very few areas appearing slightly erythematous. Scratch marks noted over few locations.

## 2013-10-19 NOTE — Assessment & Plan Note (Signed)
Elevated. He still hasn't picked up his benicar yet.  Plans: Recommended to pick up Benicar today and start taking the medicine.\ No changes to the regimen.

## 2013-10-19 NOTE — Progress Notes (Signed)
I saw and evaluated the patient.  I personally confirmed the key portions of the history and exam documented by Dr. Eyvonne Mechanic and I reviewed pertinent patient test results.  The assessment, diagnosis, and plan were formulated together and I agree with the documentation in the resident's note.

## 2013-10-19 NOTE — Patient Instructions (Signed)
Start taking Lantus 25 units subcutaneously at bedtime. Pick your Benicar tablets and start taking it from today. Use the Lubriderm moisturizing skin lotion twice daily over the skin lesions.

## 2013-10-19 NOTE — Assessment & Plan Note (Signed)
Uncontrolled. Looking at his fasting blood sugars, I really doubt if he is compliant to his Insulin regimen as the fasting blood sugars are ranging from 200' to 500. Patient stated that he missed his last night dose.  Plans: Explained to him the need for compliance to his regimen. Increase his Lantus to 25 units sq qhs. Follow up in a week (for the rash)

## 2013-10-25 ENCOUNTER — Ambulatory Visit: Payer: No Typology Code available for payment source

## 2013-10-26 ENCOUNTER — Ambulatory Visit: Payer: No Typology Code available for payment source | Admitting: Internal Medicine

## 2013-10-26 ENCOUNTER — Ambulatory Visit: Payer: No Typology Code available for payment source

## 2013-11-01 ENCOUNTER — Ambulatory Visit: Payer: Self-pay | Admitting: Internal Medicine

## 2014-01-09 ENCOUNTER — Ambulatory Visit (INDEPENDENT_AMBULATORY_CARE_PROVIDER_SITE_OTHER): Payer: No Typology Code available for payment source | Admitting: Internal Medicine

## 2014-01-09 ENCOUNTER — Encounter: Payer: Self-pay | Admitting: Internal Medicine

## 2014-01-09 VITALS — BP 160/99 | HR 95 | Temp 97.3°F | Ht 73.0 in | Wt 291.7 lb

## 2014-01-09 DIAGNOSIS — E1165 Type 2 diabetes mellitus with hyperglycemia: Secondary | ICD-10-CM

## 2014-01-09 DIAGNOSIS — IMO0002 Reserved for concepts with insufficient information to code with codable children: Secondary | ICD-10-CM

## 2014-01-09 DIAGNOSIS — I10 Essential (primary) hypertension: Secondary | ICD-10-CM

## 2014-01-09 DIAGNOSIS — E119 Type 2 diabetes mellitus without complications: Secondary | ICD-10-CM

## 2014-01-09 DIAGNOSIS — E118 Type 2 diabetes mellitus with unspecified complications: Principal | ICD-10-CM

## 2014-01-09 DIAGNOSIS — B356 Tinea cruris: Secondary | ICD-10-CM

## 2014-01-09 LAB — GLUCOSE, CAPILLARY: Glucose-Capillary: 463 mg/dL — ABNORMAL HIGH (ref 70–99)

## 2014-01-09 MED ORDER — LEVOFLOXACIN 500 MG PO TABS: 500.0000 mg | ORAL_TABLET | Freq: Every day | ORAL | Status: AC

## 2014-01-09 MED ORDER — NYSTATIN 100000 UNIT/GM EX POWD: 15.0000 g | Freq: Three times a day (TID) | CUTANEOUS | Status: DC

## 2014-01-09 MED ORDER — BACITRACIN ZINC 500 UNIT/GM EX OINT: TOPICAL_OINTMENT | CUTANEOUS | Status: DC

## 2014-01-09 NOTE — Patient Instructions (Addendum)
It was a pleasure taking care of you today, Mr. Statzer.  1. Type 2 Diabetes - Please avoid eating sweets and drinking soda. These things make your blood sugar very high. - Continue Lantus 25 units at bedtime - Continue Metformin 1000 mg twice a day   2. Rash on Groin - Ultrasound scrotum today - Nystatin cream in groin folds - Bacitracin for pustular lesions - Levaquin 500 mg daily for 7 days  General Instructions:   Please bring your medicines with you each time you come to clinic.  Medicines may include prescription medications, over-the-counter medications, herbal remedies, eye drops, vitamins, or other pills.   Progress Toward Treatment Goals:  Treatment Goal 02/07/2013  Hemoglobin A1C unchanged  Blood pressure improved    Self Care Goals & Plans:  Self Care Goal 01/09/2014  Manage my medications take my medicines as prescribed; bring my medications to every visit; refill my medications on time  Monitor my health -  Eat healthy foods drink diet soda or water instead of juice or soda; eat more vegetables; eat foods that are low in salt; eat baked foods instead of fried foods; eat fruit for snacks and desserts  Be physically active -    Home Blood Glucose Monitoring 02/07/2013  Check my blood sugar 3 times a day  When to check my blood sugar before breakfast; before lunch; before dinner     Care Management & Community Referrals:  Referral 02/07/2013  Referrals made for care management support diabetes educator  Referrals made to community resources -

## 2014-01-10 DIAGNOSIS — R21 Rash and other nonspecific skin eruption: Secondary | ICD-10-CM | POA: Insufficient documentation

## 2014-01-10 NOTE — Assessment & Plan Note (Signed)
BP slightly elevated today at 160/99. Pt currently taking Atenolol 50 mg daily, Lasix 40 mg daily, and Benicar 20 mg daily. - Consider increasing Benicar if BP elevated at next visit

## 2014-01-10 NOTE — Assessment & Plan Note (Signed)
Given hx of uncontrolled DM and appearance of the rash, patient's groin rash likely tinea cruris. Explained to patient that with uncontrolled DM that fungal infections can occur and the most important thing he can do for his health is get his diabetes under control.  - Improve DM control - Start Nystatin cream BID - Bacitracin cream for pustular lesions - Levaquin 500 mg daily for 7 days - U/S scrotum for testicular nodule - Pt instructed to stop hydrocortisone because likely worsening fungal infection  - Follow up in 1 week

## 2014-01-10 NOTE — Progress Notes (Signed)
   Subjective:    Patient ID: Terry Parks, male    DOB: 02/20/1956, 58 y.o.   MRN: [id number]  HPI Terry Parks is a 58yo man w/ PMHx of HTN, chronic systolic CHF, OSA, BPH, HLD, and uncontrolled Type 2 DM with retinopathy who presents today for the following:  1. Rash in Groin: Pt reports he has had a rash in his groin area for the last 2 months. He was previously seen on 10/19/13 for a similar rash that was more extensive involving his gluteal areas, legs, groin area, abdomen, and back. At the time he noted the rash to be pruritic and that the rash started after wearing clothes that were washed with a new detergent. He was given Hydroxyzine 25 mg TID and moisturizing lotion to be applied over the rash twice daily. He was supposed to follow up in the clinic in 2 weeks but never did so.   Today, patient reports the rash is localized to his groin area. He reports the other areas where the rash was previously have improved. He states the rash is very itchy and he has noticed red bumps that "sometimes have white heads" that he has "popped." He also notes lesions on his testicles. He states the Hydroxyzine that he was given last time does not seem to be helping. The rash has also been unresponsive to betadine and calamine lotion. He has been using Triamcinolone cream, hydrocortisone cream, and Eucerin body wash which he believes is helping the rash. He denies fever, chills, abdominal pain, nausea, vomiting, penile discharge, and pain in his genitals. He states he has not been sexually active in 3 years.   2. Type 2 DM: Last HbA1c >14.0 on 09/14/13 and today's HbA1c is >14.0. Patient takes Lantus 25 units at bedtime and Metformin 1000 mg BID. He states he has not been doing well with his diabetes. He reports eating lots of sweets and drinking regular soft drinks because he is stressed. He is currently unemployed and states he feels depressed as a result. He denies blurry vision, polydipsia, polyuria, and  tingling/numbness in his extremities.   3. HTN: BP today 160/99. Pt takes Atenolol 50 mg daily, Lasix 40 mg daily, and Benicar 20 mg daily at home.   Review of Systems General: Denies night sweats, changes in weight, changes in appetite HEENT: Denies headaches, ear pain, changes in vision, rhinorrhea, sore throat CV: Denies CP, palpitations, SOB, orthopnea Pulm: Denies SOB, cough, wheezing GI: Denies diarrhea, constipation, melena, hematochezia GU: Denies dysuria, hematuria, frequency Msk: Denies muscle cramps, joint pains Neuro: Denies weakness Skin: Denies bruising    Objective:   Physical Exam General: appears stated age, sitting up in chair, NAD HEENT: [place]/AT, EOMI, PERRL, sclera anicteric, pharynx non-erythematous, mucus membranes moist Neck: supple, no JVD, no lymphadenopathy CV: RRR, normal S1/S2, no m/g/r Pulm: CTA bilaterally, breaths non-labored, no wheezing Abd: BS+, soft, non-distended, non-tender GU: Erythematous patches present in inner aspects of thighs bilaterally, patches are covered by white slimy substance (likely treatment cream). Several pustular vesicles involving the hair follicles are present in the groin area. A large nodule is present on the left testicle, tender to palpation. No penile discharge present.  Ext: warm, no edema, moves all Neuro: alert and oriented x 3, CNs II-XII intact, strength 5/5 in upper and lower extremities bilaterally       Assessment & Plan:

## 2014-01-10 NOTE — Assessment & Plan Note (Signed)
Type 2 DM continues to be uncontrolled, with HbA1c >14.0 today. Pt states he knows it is due to his poor diet. - Discussed avoiding sweets and eating whole grains, including oatmeal, whole wheat bread. Also discussed importance of avoiding drinks with high amounts of sugar in them such as regular sodas. Pt educated on increasing vegetable intake and drinking diet soda and water vs. Regular soda - Continue Lantus 25 units QHS. Will likely need to be increased if pt does not improve diet. - Continue Metformin 1000 mg BID

## 2014-01-11 ENCOUNTER — Ambulatory Visit (HOSPITAL_COMMUNITY)
Admission: RE | Admit: 2014-01-11 | Discharge: 2014-01-11 | Disposition: A | Discharge status: Home / Self Care | Payer: Self-pay | Source: Ambulatory Visit | Attending: Internal Medicine | Admitting: Internal Medicine

## 2014-01-11 DIAGNOSIS — I861 Scrotal varices: Secondary | ICD-10-CM | POA: Insufficient documentation

## 2014-01-11 DIAGNOSIS — IMO0002 Reserved for concepts with insufficient information to code with codable children: Secondary | ICD-10-CM | POA: Insufficient documentation

## 2014-01-11 DIAGNOSIS — N433 Hydrocele, unspecified: Secondary | ICD-10-CM | POA: Insufficient documentation

## 2014-01-11 DIAGNOSIS — R21 Rash and other nonspecific skin eruption: Secondary | ICD-10-CM | POA: Insufficient documentation

## 2014-01-11 DIAGNOSIS — E1165 Type 2 diabetes mellitus with hyperglycemia: Secondary | ICD-10-CM

## 2014-01-11 DIAGNOSIS — E118 Type 2 diabetes mellitus with unspecified complications: Secondary | ICD-10-CM

## 2014-01-14 NOTE — Progress Notes (Signed)
INTERNAL MEDICINE TEACHING ATTENDING ADDENDUM - Terry Contes, MD: I personally saw and evaluated Terry Parks in this clinic visit in conjunction with the resident, Dr. Arcelia Jew. I have discussed patient's plan of care with medical resident during this visit. I have confirmed the physical exam findings and have read and agree with the clinic note including the plan with the following addition: - Patient here for follow up of groin rash - On exam- Patient with likely fungal rash in inguinal folds bilateral, pustular lesions in groin as well as scrotal nodule - Patient with uncontrolled DM. Does not want to change regimen without talking with PCP - Educated patient on diet. He states he will try to be compliant with diet - Started on nystatin for fungal rash - Levaquin started for pustular lesions - Will check scrotal sono to evaluate nodule

## 2014-01-17 ENCOUNTER — Ambulatory Visit (INDEPENDENT_AMBULATORY_CARE_PROVIDER_SITE_OTHER): Payer: No Typology Code available for payment source | Admitting: Internal Medicine

## 2014-01-17 VITALS — BP 147/97 | HR 73 | Temp 97.1°F | Resp 20 | Ht 71.0 in | Wt 296.1 lb

## 2014-01-17 DIAGNOSIS — R21 Rash and other nonspecific skin eruption: Secondary | ICD-10-CM

## 2014-01-17 DIAGNOSIS — N492 Inflammatory disorders of scrotum: Secondary | ICD-10-CM | POA: Insufficient documentation

## 2014-01-17 DIAGNOSIS — B356 Tinea cruris: Secondary | ICD-10-CM

## 2014-01-17 DIAGNOSIS — N498 Inflammatory disorders of other specified male genital organs: Secondary | ICD-10-CM

## 2014-01-17 LAB — GLUCOSE, CAPILLARY: Glucose-Capillary: 430 mg/dL — ABNORMAL HIGH (ref 70–99)

## 2014-01-17 LAB — POCT GLYCOSYLATED HEMOGLOBIN (HGB A1C): Hemoglobin A1C: >14

## 2014-01-17 MED ORDER — BACITRACIN ZINC 500 UNIT/GM EX OINT: TOPICAL_OINTMENT | CUTANEOUS | Status: DC

## 2014-01-17 MED ORDER — SULFAMETHOXAZOLE-TRIMETHOPRIM 400-80 MG PO TABS: 2.0000 | ORAL_TABLET | Freq: Two times a day (BID) | ORAL | Status: DC

## 2014-01-17 NOTE — Progress Notes (Signed)
Patient ID: Terry Parks, male   DOB: [birth year], 58 y.o.   MRN: [id number]   Subjective:   Patient ID: Terry Parks male   DOB: [birth year] 58 y.o.   MRN: [id number]  HPI: Mr.[name] is a 58 y.o. with PMH of HTN, DM- uncontrolled, OSA, HLD, BPH, presented today for follow up of Scrotal abcess. Please see assessment and plan for details.  Past Medical History  Diagnosis Date  . Diabetes mellitus 04/08/2008  . Hypertension   . Hyperlipemia   . Obstructive sleep apnea 03/06/2008    Sleep study 03/06/08 showed severe OSA/hypopnea syndrome, with successful CPAP titration to 13 CWP using a medium ResMed Mirage Quattro full face mask with heated humidifier.   . Cardiomegaly     Mild by CXR 11/04/04  . BPH (benign prostatic hypertrophy)     Massive BPH noted on cystoscopy 1/23/ 2012 by Dr. Risa Grill.  . Renal calculus 05/29/2010    CT scan of abdomen/pelvis on 05/29/2010 showed an obstructing approximate 1-2 mm calculus at the left UVJ, and an approximate 1-2 mm left lower pole renal calculus.   Patient had continuing severe pain , and an elevation of his serum creatinine to a value of 1.75 on 06/06/2010.  The stone had apparently passed and was not seen on repeat CT 06/08/2010.  [name] Headache(784.0)   . Allergic rhinitis   . Neck pain   . Abscess of groin   . Abscessed tooth   . Hypertensive cardiopathy 03/01/2006    2-D echocardiogram 02/01/2012 showed moderate LVH, mildly to moderately reduced left ventricular systolic function with an estimated ejection fraction of 40-45%, and diffuse hypokinesis.  A nuclear medicine stress study done 01/31/2012 showed no reversible ischemia, a small mid anterior wall fixed defect/infarct, and ejection fraction 42%.       Current Outpatient Prescriptions  Medication Sig Dispense Refill  . aspirin 81 MG EC tablet Take 1 tablet (81 mg total) by mouth daily.  30 tablet  6  . atenolol (TENORMIN) 50 MG tablet Take 1 tablet (50 mg total) by mouth daily.  30  tablet  6  . bacitracin ointment Apply to affected area daily  30 g  0  . cromolyn (OPTICROM) 4 % ophthalmic solution Place 1-2 drops into both eyes 4 (four) times daily.  10 mL  1  . furosemide (LASIX) 40 MG tablet Take 1 tablet (40 mg total) by mouth daily.  30 tablet  3  . glucose blood (TRUETRACK TEST) test strip Use as directed to test blood sugar three times a day before meals.  100 each  1  . hydrOXYzine (ATARAX/VISTARIL) 25 MG tablet Take 1 tablet three times daily.  30 tablet  0  . Insulin Glargine (LANTUS SOLOSTAR) 100 UNIT/ML Solostar Pen Take 25 units subcutaneously at bedtime everyday.  15 mL  3  . Lancets MISC Use to test blood sugar three times a day before meals.  100 each  1  . levofloxacin (LEVAQUIN) 500 MG tablet Take 1 tablet (500 mg total) by mouth daily.  7 tablet  0  . metFORMIN (GLUCOPHAGE) 1000 MG tablet Take 1 tablet (1,000 mg total) by mouth 2 (two) times daily with a meal.  60 tablet  6  . nystatin (MYCOSTATIN/NYSTOP) 100000 UNIT/GM POWD Apply 15 g topically 3 (three) times daily.  1 Bottle  0  . olmesartan (BENICAR) 20 MG tablet Take 1 tablet (20 mg total) by mouth daily.  30 tablet  3  . pravastatin (PRAVACHOL) 40  MG tablet Take 1 tablet once daily at bedtime.  30 tablet  5  . sulfamethoxazole-trimethoprim (BACTRIM,SEPTRA) 400-80 MG per tablet Take 2 tablets by mouth 2 (two) times daily.  28 tablet  0  . [DISCONTINUED] albuterol (PROVENTIL HFA;VENTOLIN HFA) 108 (90 BASE) MCG/ACT inhaler Inhale 1-2 puffs into the lungs every 6 (six) hours as needed for wheezing or shortness of breath.      . [DISCONTINUED] chlorpheniramine (CHLOR-TRIMETON) 4 MG tablet Take 1 tablet (4 mg total) by mouth 2 (two) times daily as needed for allergies.  30 tablet  0   No current facility-administered medications for this visit.   Family History  Problem Relation Age of Onset  . Breast cancer Mother   . Colon cancer Neg Hx   . Prostate cancer Neg Hx   . Heart attack Neg Hx   .  Hypertension Father   . Diabetes Maternal Grandmother    History   Social History  . Marital Status: Single    Spouse Name: N/A    Number of Children: 4 b  . Years of Education: 16   Occupational History  .        Social History Main Topics  . Smoking status: Never Smoker   . Smokeless tobacco: Never Used  . Alcohol Use: No  . Drug Use: No  . Sexual Activity: Not Currently   Other Topics Concern  . Not on file   Social History Narrative   Divorced, 4 children, lives alone.  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   Review of Systems: CONSTITUTIONAL- No Fever, weightloss, night sweat or change in appetite. SKIN-  Rash- Pubic region and left upper , colour changes or itching. HEAD- No Headache or dizziness. EYES- No Vision loss, pain, redness, double or blurred vision. RESPIRATORY- No Cough or SOB. CARDIAC- No Palpitations, DOE, PND or chest pain. GI- No nausea, vomiting, diarrhoea, constipation, abd pain. URINARY- No Frequency, urgency, straining or dysuria. NEUROLOGIC- No Numbness, syncope, seizures or burning. [facility]- Denies depression or anxiety.  Objective:  Physical Exam: Filed Vitals:   01/17/14 0839  BP: 147/97  Pulse: 73  Temp: 97.1 F (36.2 C)  TempSrc: Oral  Resp: 20  Height: 5\' 11"  (1.803 m)  Weight: 296 lb 1.6 oz (134.31 kg)  SpO2: 99%   GENERAL- alert, co-operative, appears as stated age, not in any distress. HEENT- Atraumatic, normocephalic, PERRL, EOMI,  neck supple. CARDIAC- RRR, no murmurs, rubs or gallops. RESP- Moving equal volumes of air, and clear to auscultation bilaterally, no wheezes or crackles. ABDOMEN- Soft, nontender, no guarding or rebound, no palpable masses or organomegaly, bowel sounds present. Groin- Scrotum- ~1 by ~1 cm  abcess on left scrotum, draining pus, about a ~2 by ~4 cm marked indurated surrounding area, area appears hyperpigmented compared to surrounding skin, not warm to touch, tender to palpation. What appears to be  healing papular lesions- folliculitis in pubic region and left upper inner thigh.  BACK- Normal curvature of the spine, No tenderness along the vertebrae, no CVA tenderness. NEURO- No obvious Cr N abnormality, strenght upper and lower extremities- 5/5, Gait- Normal. EXTREMITIES- pulse 2+, symmetric, no pedal edema. PSYCH- Normal mood and affect, appropriate thought content and speech.  Assessment & Plan:   The patient's case and plan of care was discussed with attending physician, Dr. Eppie Gibson.  Please see problem based charting for assessment and plan.

## 2014-01-17 NOTE — Assessment & Plan Note (Addendum)
Scrotal Uss done- 01/11/2014 impression-  ?Abcess vs ? Mass, not determined. Pt says, later that day after his last visit, the nodule started draining. And has been draining since then. Swelling has not worsened in severity. No fever. Region still tender to palpation. No concern for fourniers gangrene, as no necrotic tissue observed, vitals stable without systemic toxicity and no foul smelling discharge present. Pt Completed 7 day course of levaquin.  Assessment and Plan- Scrotal abcess, does not appear to be worsening, according to patient its about the same except that it is still draining, also area with markedly indurated. Will start Bactrim, which has excellent coverage for MRSA.  - Bactrim- 800-160 BID for 7 days. - See in 1 week, if unresolved, consider repeat USS, refferal to urology. - Also counselled on better control of Dm, blood sugars elevated this morning, pt didn't take his insulin today. Some apprehension about needles and no time in the mornings. Encouraged to take insulin at night, check his blood sugars and come with glucometer next visit. Might require adjustement based on log readings.     Notes by Albin Felling, MD- 01/09/2014- Groin rash-  Given hx of uncontrolled DM and appearance of the rash, patient's groin rash likely tinea cruris. Explained to patient that with uncontrolled DM that fungal infections can occur and the most important thing he can do for his health is get his diabetes under control.  - Improve DM control  - Start Nystatin cream BID  - Bacitracin cream for pustular lesions  - Levaquin 500 mg daily for 7 days  - U/S scrotum for testicular nodule  - Pt instructed to stop hydrocortisone because likely worsening fungal infection  - Follow up in 1 week

## 2014-01-18 ENCOUNTER — Telehealth: Payer: Self-pay | Admitting: *Deleted

## 2014-01-18 DIAGNOSIS — R0602 Shortness of breath: Secondary | ICD-10-CM

## 2014-01-18 MED ORDER — IPRATROPIUM-ALBUTEROL 20-100 MCG/ACT IN AERS: 1.0000 | INHALATION_SPRAY | Freq: Four times a day (QID) | RESPIRATORY_TRACT | Status: DC | PRN

## 2014-01-18 NOTE — Assessment & Plan Note (Signed)
What appears to be folliculities in pubic region and superior medial aspect of thigh. likely also a complication of shaving. Does not appear to be resolving, but pt has been using both hydrocortisone cream and nystatin cream on area, he hasnt gotten the bacitracin cream.  Plan- Start bacitracin cream - Stop hydrocort cream, also ran out of nystatin cream, which i didn't refill as low suspicion for fungal infection.

## 2014-01-18 NOTE — Progress Notes (Signed)
I saw and evaluated the patient. I personally confirmed the key portions of Dr. Talmadge Coventry history and exam and reviewed pertinent patient test results. The assessment, diagnosis, and plan were formulated together and I agree with the documentation in the resident's note.   He has a resolving folliculitis where he shaved his pubic hair.  His left lateral scrotum has an ulcer with minimal drainage and no expressible pus.  The area is indurated and firm, not fluctuant.  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

## 2014-01-18 NOTE — Telephone Encounter (Signed)
Prescription refilled as requested.  Please phone into pharmacy.  Thanks.

## 2014-01-18 NOTE — Telephone Encounter (Signed)
[facility] is requesting refill on Combivent inhaler from Rx  10/04/12. Note from [facility] needs new Rx for Respimat.  Hilda Blades Demarus Latterell RN 01/18/14 2:30PM

## 2014-01-23 ENCOUNTER — Ambulatory Visit (INDEPENDENT_AMBULATORY_CARE_PROVIDER_SITE_OTHER): Payer: No Typology Code available for payment source | Admitting: Internal Medicine

## 2014-01-23 ENCOUNTER — Encounter: Payer: Self-pay | Admitting: Internal Medicine

## 2014-01-23 VITALS — BP 145/101 | HR 73 | Temp 97.9°F | Ht 71.0 in | Wt 294.3 lb

## 2014-01-23 DIAGNOSIS — I5022 Chronic systolic (congestive) heart failure: Secondary | ICD-10-CM

## 2014-01-23 DIAGNOSIS — N498 Inflammatory disorders of other specified male genital organs: Secondary | ICD-10-CM

## 2014-01-23 DIAGNOSIS — R21 Rash and other nonspecific skin eruption: Secondary | ICD-10-CM

## 2014-01-23 DIAGNOSIS — IMO0002 Reserved for concepts with insufficient information to code with codable children: Secondary | ICD-10-CM

## 2014-01-23 DIAGNOSIS — E1165 Type 2 diabetes mellitus with hyperglycemia: Secondary | ICD-10-CM

## 2014-01-23 DIAGNOSIS — I10 Essential (primary) hypertension: Secondary | ICD-10-CM

## 2014-01-23 DIAGNOSIS — E118 Type 2 diabetes mellitus with unspecified complications: Secondary | ICD-10-CM

## 2014-01-23 DIAGNOSIS — N492 Inflammatory disorders of scrotum: Secondary | ICD-10-CM

## 2014-01-23 MED ORDER — ATENOLOL 100 MG PO TABS: 100.0000 mg | ORAL_TABLET | Freq: Every day | ORAL | Status: DC

## 2014-01-23 MED ORDER — GABAPENTIN 100 MG PO CAPS: 300.0000 mg | ORAL_CAPSULE | Freq: Two times a day (BID) | ORAL | Status: DC

## 2014-01-23 MED ORDER — GABAPENTIN 300 MG PO CAPS: 300.0000 mg | ORAL_CAPSULE | Freq: Two times a day (BID) | ORAL | Status: DC

## 2014-01-23 MED ORDER — BACITRACIN-NEOMYCIN-POLYMYXIN 400-5-5000 EX OINT: 1.0000 "application " | TOPICAL_OINTMENT | Freq: Two times a day (BID) | CUTANEOUS | Status: DC

## 2014-01-23 NOTE — Progress Notes (Signed)
Patient ID: Terry Parks, male   DOB: Dec 21, 1955, 58 y.o.   MRN: [id number]   Subjective:   Patient ID: Terry Parks male   DOB: [birth year] 58 y.o.   MRN: [id number]  HPI: [name] is a 58 y.o. with PMH stated below. Presents for follow up of groin abcess. See assessment and plan for details.  Past Medical History  Diagnosis Date  . Diabetes mellitus 04/08/2008  . Hypertension   . Hyperlipemia   . Obstructive sleep apnea 03/06/2008    Sleep study 03/06/08 showed severe OSA/hypopnea syndrome, with successful CPAP titration to 13 CWP using a medium ResMed Mirage Quattro full face mask with heated humidifier.   . Cardiomegaly     Mild by CXR 11/04/04  . BPH (benign prostatic hypertrophy)     Massive BPH noted on cystoscopy 1/23/ 2012 by Dr. Risa Grill.  . Renal calculus 05/29/2010    CT scan of abdomen/pelvis on 05/29/2010 showed an obstructing approximate 1-2 mm calculus at the left UVJ, and an approximate 1-2 mm left lower pole renal calculus.   Patient had continuing severe pain , and an elevation of his serum creatinine to a value of 1.75 on 06/06/2010.  The stone had apparently passed and was not seen on repeat CT 06/08/2010.  [name] Headache(784.0)   . Allergic rhinitis   . Neck pain   . Abscess of groin   . Abscessed tooth   . Hypertensive cardiopathy 03/01/2006    2-D echocardiogram 02/01/2012 showed moderate LVH, mildly to moderately reduced left ventricular systolic function with an estimated ejection fraction of 40-45%, and diffuse hypokinesis.  A nuclear medicine stress study done 01/31/2012 showed no reversible ischemia, a small mid anterior wall fixed defect/infarct, and ejection fraction 42%.       Current Outpatient Prescriptions  Medication Sig Dispense Refill  . aspirin 81 MG EC tablet Take 1 tablet (81 mg total) by mouth daily.  30 tablet  6  . atenolol (TENORMIN) 50 MG tablet Take 1 tablet (50 mg total) by mouth daily.  30 tablet  6  . bacitracin ointment Apply to  affected area daily  30 g  0  . cromolyn (OPTICROM) 4 % ophthalmic solution Place 1-2 drops into both eyes 4 (four) times daily.  10 mL  1  . furosemide (LASIX) 40 MG tablet Take 1 tablet (40 mg total) by mouth daily.  30 tablet  3  . glucose blood (TRUETRACK TEST) test strip Use as directed to test blood sugar three times a day before meals.  100 each  1  . hydrOXYzine (ATARAX/VISTARIL) 25 MG tablet Take 1 tablet three times daily.  30 tablet  0  . Insulin Glargine (LANTUS SOLOSTAR) 100 UNIT/ML Solostar Pen Take 25 units subcutaneously at bedtime everyday.  15 mL  3  . Ipratropium-Albuterol (COMBIVENT) 20-100 MCG/ACT AERS respimat Inhale 1 puff into the lungs every 6 (six) hours as needed for wheezing or shortness of breath.  4 g  6  . Lancets MISC Use to test blood sugar three times a day before meals.  100 each  1  . metFORMIN (GLUCOPHAGE) 1000 MG tablet Take 1 tablet (1,000 mg total) by mouth 2 (two) times daily with a meal.  60 tablet  6  . nystatin (MYCOSTATIN/NYSTOP) 100000 UNIT/GM POWD Apply 15 g topically 3 (three) times daily.  1 Bottle  0  . olmesartan (BENICAR) 20 MG tablet Take 1 tablet (20 mg total) by mouth daily.  30 tablet  3  .  pravastatin (PRAVACHOL) 40 MG tablet Take 1 tablet once daily at bedtime.  30 tablet  5  . sulfamethoxazole-trimethoprim (BACTRIM,SEPTRA) 400-80 MG per tablet Take 2 tablets by mouth 2 (two) times daily.  28 tablet  0  . [DISCONTINUED] albuterol (PROVENTIL HFA;VENTOLIN HFA) 108 (90 BASE) MCG/ACT inhaler Inhale 1-2 puffs into the lungs every 6 (six) hours as needed for wheezing or shortness of breath.      . [DISCONTINUED] chlorpheniramine (CHLOR-TRIMETON) 4 MG tablet Take 1 tablet (4 mg total) by mouth 2 (two) times daily as needed for allergies.  30 tablet  0   No current facility-administered medications for this visit.   Family History  Problem Relation Age of Onset  . Breast cancer Mother   . Colon cancer Neg Hx   . Prostate cancer Neg Hx   . Heart  attack Neg Hx   . Hypertension Father   . Diabetes Maternal Grandmother    History   Social History  . Marital Status: Single    Spouse Name: N/A    Number of Children: 4 b  . Years of Education: 16   Occupational History  .        Social History Main Topics  . Smoking status: Never Smoker   . Smokeless tobacco: Never Used  . Alcohol Use: No  . Drug Use: No  . Sexual Activity: Not Currently   Other Topics Concern  . None   Social History Narrative   Divorced, 4 children, lives alone.  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   Review of Systems: CONSTITUTIONAL- No Fever, weightloss, night sweat or change in appetite. SKIN- Rash- suprapubic region, no itching. HEAD- No Headache or dizziness. Mouth/throat- No Sorethroat, dentures, or bleeding gums. RESPIRATORY- No Cough or SOB. CARDIAC- No Palpitations, DOE, PND or chest pain. GI- No nausea, vomiting, diarrhoea, constipation, abd pain. URINARY- No Frequency, urgency, straining or dysuria. NEUROLOGIC- No Numbness, syncope, seizures or burning. [facility]- Denies depression or anxiety symptoms.  Objective:  Physical Exam: Filed Vitals:   01/23/14 0834  BP: 145/101  Pulse: 73  Temp: 97.9 F (36.6 C)  TempSrc: Oral  Height: 5\' 11"  (1.803 m)  Weight: 294 lb 4.8 oz (133.494 kg)  SpO2: 100%   GENERAL- alert, pleasant gentleman, co-operative, appears as stated age, not in any distress. HEENT- Atraumatic, normocephalic, neck supple. CARDIAC- RRR, no murmurs, rubs or gallops. RESP- Moving equal volumes of air, and clear to auscultation bilaterally, no wheezes or crackles. ABDOMEN- Soft, nontender,  no CVA tenderness. NEURO- No obvious Cr N abnormality, strenght upper and lower extremities-  intact, Gait- Normal. EXTREMITIES- pulse 2+, symmetric, no pedal edema.  SKIN- Scrotal abscess much improved compared to prior- induration resolving now down to about 2 by 2 cm, ulcer on scrotum healed. No drainage. No fluctuance. Supra  pubic region with small papular lesions still present- improved compared to prior.  PSYCH- Normal mood and affect, appropriate thought content and speech.  Assessment & Plan:  The patient's case and plan of care was discussed with attending physician, Dr. Eppie Gibson.  Please see problem based charting for assessment and plan.

## 2014-01-23 NOTE — Assessment & Plan Note (Signed)
Brought Glucometer today- readings in the 300s- 400s., but has not taken his lantus in about a week. Pt says he ran out. Pt is in the [facility] program. Was called today to pick up his prescription. Pt says she has been complaint with metformin 1000mg  BID. Pt says now he is having significant tingling in his feet, bilat- soles to ankles, present for about a year now, is bothersome and disturbs his sleep. No Back pain, or shooting pain down is feet. No tingling in his hands.  Pt is requesting medication for this today.  Plan- Spent time explaining why he needs to get his diabetes under control. Encouraged to pick up prescription, and take his insulin. - Com back in 4 weeks with Glucometer log. - Gabapentin 300mg  BID.

## 2014-01-23 NOTE — Assessment & Plan Note (Signed)
Resolving. Ulcer has healed up, mild induration present. Pt has 2 doses of Bactrim to complete 7 day course.

## 2014-01-23 NOTE — Progress Notes (Signed)
Case discussed with Dr. Emokpae soon after the resident saw the patient. We reviewed the resident's history and exam and pertinent patient test results. I agree with the assessment, diagnosis, and plan of care documented in the resident's note. 

## 2014-01-23 NOTE — Assessment & Plan Note (Signed)
Groin rash- Still resolving. Likely folliculitis from close shaving. Likely improvement from Bactrim course.  Plan- Pt encouraged to avoid close shaving. - Improve hygiene especially in skin folds and pannus.

## 2014-01-23 NOTE — Assessment & Plan Note (Signed)
BP Readings from Last 3 Encounters:  01/23/14 145/101  01/17/14 147/97  01/09/14 160/99    Lab Results  Component Value Date   NA 135 01/24/2013   K 4.2 01/24/2013   CREATININE 1.10 01/24/2013    Assessment: Blood pressure control:  Uncontrolled Progress toward BP goal:   Not at goal Comments: Complaint with Lasix- 40mg  daily, Atenolol 50mg  daily, Benicar- 20mg  daily  Plan: Medications:  Increase Atenolol to 100mg  daily.

## 2014-01-23 NOTE — Patient Instructions (Signed)
General Instructions: I want you to apply the neosporine or bacitracin cream in your groin area. Do not use hydrocortisone cream.  We will also want you to come in 4 weeks to check your blood sugars. Please take your insulin everyday. This is very important.   Continue with the metformin and your blood pressure pills.  Also we will be prescribing something for the tingling you are having in both feet. This is a complication of diabetes, so it is imporant that your diabetes is well controlled.   Please bring your medicines with you each time you come to clinic.  Medicines may include prescription medications, over-the-counter medications, herbal remedies, eye drops, vitamins, or other pills.

## 2014-01-25 NOTE — Telephone Encounter (Signed)
Rx called in to pharmacy. Hilda Blades Labrisha Wuellner RN 01/25/14 4:40PM

## 2014-02-06 ENCOUNTER — Other Ambulatory Visit: Payer: Self-pay | Admitting: *Deleted

## 2014-02-06 DIAGNOSIS — I5022 Chronic systolic (congestive) heart failure: Secondary | ICD-10-CM

## 2014-02-06 MED ORDER — OLMESARTAN MEDOXOMIL 20 MG PO TABS: 20.0000 mg | ORAL_TABLET | Freq: Every day | ORAL | Status: DC

## 2014-02-07 NOTE — Telephone Encounter (Signed)
Rx called in to pharmacy. Hilda Blades Jasmene Goswami RN 02/07/14 3:25PM

## 2014-02-20 ENCOUNTER — Encounter: Payer: Self-pay | Admitting: *Deleted

## 2014-03-26 ENCOUNTER — Ambulatory Visit: Payer: No Typology Code available for payment source

## 2014-04-02 ENCOUNTER — Ambulatory Visit: Payer: Self-pay

## 2014-04-09 ENCOUNTER — Other Ambulatory Visit: Payer: Self-pay | Admitting: *Deleted

## 2014-04-09 DIAGNOSIS — I5022 Chronic systolic (congestive) heart failure: Secondary | ICD-10-CM

## 2014-04-09 MED ORDER — FUROSEMIDE 40 MG PO TABS: 40.0000 mg | ORAL_TABLET | Freq: Every day | ORAL | Status: DC

## 2014-04-09 NOTE — Telephone Encounter (Signed)
I sent furosemide refill to pharmacy.  Please have patient come in for blood draw within 1 week to check electrolytes and renal function.

## 2014-04-15 NOTE — Telephone Encounter (Signed)
Talked with pt - has appt with Dr Marinda Elk 04/17/14 AM.

## 2014-04-17 ENCOUNTER — Ambulatory Visit (INDEPENDENT_AMBULATORY_CARE_PROVIDER_SITE_OTHER): Payer: Self-pay | Admitting: Internal Medicine

## 2014-04-17 ENCOUNTER — Ambulatory Visit (INDEPENDENT_AMBULATORY_CARE_PROVIDER_SITE_OTHER): Payer: Self-pay | Admitting: *Deleted

## 2014-04-17 ENCOUNTER — Telehealth: Payer: Self-pay | Admitting: *Deleted

## 2014-04-17 ENCOUNTER — Encounter: Payer: Self-pay | Admitting: Internal Medicine

## 2014-04-17 ENCOUNTER — Encounter: Payer: Self-pay | Admitting: Dietician

## 2014-04-17 ENCOUNTER — Ambulatory Visit (INDEPENDENT_AMBULATORY_CARE_PROVIDER_SITE_OTHER): Payer: Self-pay | Admitting: Dietician

## 2014-04-17 VITALS — BP 166/106 | HR 106 | Temp 98.1°F | Wt 293.8 lb

## 2014-04-17 DIAGNOSIS — IMO0002 Reserved for concepts with insufficient information to code with codable children: Secondary | ICD-10-CM

## 2014-04-17 DIAGNOSIS — N529 Male erectile dysfunction, unspecified: Secondary | ICD-10-CM

## 2014-04-17 DIAGNOSIS — E1165 Type 2 diabetes mellitus with hyperglycemia: Secondary | ICD-10-CM

## 2014-04-17 DIAGNOSIS — Z23 Encounter for immunization: Secondary | ICD-10-CM

## 2014-04-17 DIAGNOSIS — E785 Hyperlipidemia, unspecified: Secondary | ICD-10-CM

## 2014-04-17 DIAGNOSIS — I5022 Chronic systolic (congestive) heart failure: Secondary | ICD-10-CM

## 2014-04-17 DIAGNOSIS — R21 Rash and other nonspecific skin eruption: Secondary | ICD-10-CM | POA: Insufficient documentation

## 2014-04-17 DIAGNOSIS — R1909 Other intra-abdominal and pelvic swelling, mass and lump: Secondary | ICD-10-CM

## 2014-04-17 DIAGNOSIS — I10 Essential (primary) hypertension: Secondary | ICD-10-CM

## 2014-04-17 DIAGNOSIS — R229 Localized swelling, mass and lump, unspecified: Secondary | ICD-10-CM

## 2014-04-17 HISTORY — DX: Rash and other nonspecific skin eruption: R21

## 2014-04-17 LAB — COMPLETE METABOLIC PANEL WITH GFR
ALK PHOS: 90 U/L (ref 39–117)
ALT: 12 U/L (ref 0–53)
AST: 12 U/L (ref 0–37)
Albumin: 3.9 g/dL (ref 3.5–5.2)
BUN: 8 mg/dL (ref 6–23)
CO2: 30 mEq/L (ref 19–32)
CREATININE: 1.04 mg/dL (ref 0.50–1.35)
Calcium: 9.1 mg/dL (ref 8.4–10.5)
Chloride: 96 mEq/L (ref 96–112)
GFR, Est African American: >89 mL/min
GFR, Est Non African American: 80 mL/min
Glucose, Bld: 300 mg/dL — ABNORMAL HIGH (ref 70–99)
Potassium: 4.1 mEq/L (ref 3.5–5.3)
Sodium: 139 mEq/L (ref 135–145)
Total Bilirubin: 0.5 mg/dL (ref 0.3–1.2)
Total Protein: 7.7 g/dL (ref 6.0–8.3)

## 2014-04-17 LAB — LIPID PANEL
CHOL/HDL RATIO: 5.8 ratio
CHOLESTEROL: 228 mg/dL — AB (ref 0–200)
HDL: 39 mg/dL — ABNORMAL LOW (ref >39)
LDL Cholesterol: 165 mg/dL — ABNORMAL HIGH (ref 0–99)
TRIGLYCERIDES: 118 mg/dL (ref <150)
VLDL: 24 mg/dL (ref 0–40)

## 2014-04-17 LAB — GLUCOSE, CAPILLARY: GLUCOSE-CAPILLARY: 296 mg/dL — AB (ref 70–99)

## 2014-04-17 LAB — POCT GLYCOSYLATED HEMOGLOBIN (HGB A1C): Hemoglobin A1C: >14

## 2014-04-17 LAB — HM DIABETES EYE EXAM

## 2014-04-17 MED ORDER — NYSTATIN 100000 UNIT/GM EX POWD: 15.0000 g | Freq: Three times a day (TID) | CUTANEOUS | Status: DC

## 2014-04-17 MED ORDER — TRIAMCINOLONE ACETONIDE 0.1 % EX CREA: 1.0000 "application " | TOPICAL_CREAM | Freq: Two times a day (BID) | CUTANEOUS | Status: DC

## 2014-04-17 MED ORDER — NYSTATIN 100000 UNIT/GM EX POWD: CUTANEOUS | Status: DC

## 2014-04-17 MED ORDER — DOXYCYCLINE HYCLATE 100 MG PO CAPS: 100.0000 mg | ORAL_CAPSULE | Freq: Two times a day (BID) | ORAL | Status: DC

## 2014-04-17 MED ORDER — FLUCONAZOLE 150 MG PO TABS: 150.0000 mg | ORAL_TABLET | ORAL | Status: DC

## 2014-04-17 MED ORDER — SILDENAFIL CITRATE 50 MG PO TABS: 50.0000 mg | ORAL_TABLET | Freq: Every day | ORAL | Status: DC | PRN

## 2014-04-17 NOTE — Telephone Encounter (Signed)
[facility] only carries doxy monohydrate and not doxy hyclate, is this ok?

## 2014-04-17 NOTE — Progress Notes (Signed)
   Subjective:    Patient ID: Terry Parks, male    DOB: 01-09-1956, 58 y.o.   MRN: [id number]  HPI Patient returns for management of his uncontrolled diabetes mellitus, hypertension, hyperlipidemia, rash on forearms, and other chronic medical problems.  Other complaints include a rash in his groin area which was previously diagnosed as tinea cruris and treated with topical nystatin powder, and a tender nodule in his right groin near the base of his scrotum which has been present for the past few days.  His diabetes is very poorly controlled; his blood sugars over the past month have ranged from 257 to a high of 526, with most values above 300.  He reports that he forgets to take his Lantus insulin perhaps twice a week, but otherwise says that he has been compliant with his Lantus and his metformin.  He acknowledges that he is not following a good diabetic eating plan.  He has been using hydrocortisone 2.5% cream for the rash on his forearms without improvement.  Today he mentioned that he has had problems with erectile dysfunction and requested a trial of Viagra.   Review of Systems  Constitutional: Negative for fever, chills and diaphoresis.  HENT: Negative for hearing loss.   Eyes: Negative for pain, discharge, redness, itching and visual disturbance.  Respiratory: Negative for cough and shortness of breath.   Cardiovascular: Positive for leg swelling (Mild). Negative for chest pain.  Gastrointestinal: Negative for nausea, vomiting, abdominal pain, blood in stool and anal bleeding.  Endocrine: Positive for polydipsia. Negative for polyphagia and polyuria.  Genitourinary: Negative for dysuria, frequency and hematuria.  Musculoskeletal: Negative for back pain and arthralgias.  Skin: Positive for rash (Bilateral forearms).  Neurological: Negative for dizziness, syncope, weakness and numbness.    I reviewed and updated the medication list, allergies, past medical history, past surgical  history, family history, and social history.     Objective:   Physical Exam  Constitutional: No distress.  Cardiovascular: Normal rate, regular rhythm and normal heart sounds.  Exam reveals no gallop and no friction rub.   No murmur heard. Pulmonary/Chest: Effort normal and breath sounds normal. No respiratory distress. He has no wheezes. He has no rales.  Abdominal: Soft. Bowel sounds are normal. He exhibits no distension. There is no tenderness. There is no rebound and no guarding.  Genitourinary:     Skin:             Assessment & Plan:

## 2014-04-17 NOTE — Assessment & Plan Note (Signed)
Assessment: Patient has a rash on both forearms which he reports is chronic; exam shows hyperpigmented maculopapular rash which may represent eczema.  He reports some improvement with topical hydrocortisone 2.5% cream  Plan: Treat with triamcinolone 0.1% cream topically twice a day for 2 weeks.  I advised patient to let me know if the rash does not resolve.

## 2014-04-17 NOTE — Patient Instructions (Signed)
Your goal for the next 2-3 weeks to improve your diet is to decrease coke intake by drinking more water and tea with less sugar.   Please make a follow up appointment for 2-3 weeks

## 2014-04-17 NOTE — Telephone Encounter (Signed)
[facility] only carries doxy monohydrate capsules, you wrote for doxy hyclate, is this ok with you? Crystal [phone] [id number]

## 2014-04-17 NOTE — Assessment & Plan Note (Signed)
BP Readings from Last 3 Encounters:  04/17/14 166/106  01/23/14 145/101  01/17/14 147/97    Lab Results  Component Value Date   NA 135 01/24/2013   K 4.2 01/24/2013   CREATININE 1.10 01/24/2013    Assessment: Blood pressure control: moderately elevated Progress toward BP goal:  deteriorated Comments: Blood pressure is moderately elevated on atenolol 100 mg daily, furosemide 40 mg daily, and olmesartan (Benicar) 20 mg daily; however patient has been out of his furosemide recently and plans to pick up his refill today  Plan: Medications:  continue current medications Educational resources provided: brochure Self management tools provided: home blood pressure logbook

## 2014-04-17 NOTE — Assessment & Plan Note (Signed)
Assessment: Patient has a small approximately 2 cm tender nodule in his right groin at the base of his scrotum.  There is no apparent fluctuance or drainage.   Plan: Treat with doxycycline 100 mg twice a day for 10 days; patient will return in 1 week for reevaluation.  I advised him to return sooner if this increases in size or he has other concerning symptoms.

## 2014-04-17 NOTE — Addendum Note (Signed)
Addended by: Truddie Crumble on: 04/17/2014 02:00 PM   Modules accepted: Orders

## 2014-04-17 NOTE — Assessment & Plan Note (Signed)
Assessment: Patient reports erectile problems and requested a trial of Viagra.  Plan: I discussed the risks/benefits of a trial of Viagra with patient.  Plan is treat with Viagra 50 mg daily as needed for erectile dysfunction.  I cautioned patient never to take Viagra or a similar medication if he should ever in the future be on nitroglycerin or other medications continue nitrates.

## 2014-04-17 NOTE — Patient Instructions (Addendum)
Increase Lantus insulin to a dose of 35 units once a day. Start doxycycline 100 mg 1 capsule twice a day for 10 days. Start fluconazole 150 mg 1 tablet once a week for 4 weeks. Start Viagra 50 mg once daily as needed for erectile dysfunction.   Please schedule an appointment with diabetes educator Debera Lat. Start triamcinolone 0.1% cream twice a day to rash on arms for 2 weeks.

## 2014-04-17 NOTE — Assessment & Plan Note (Signed)
Assessment: Patient has a rash bilaterally in his groin which may be candidal intertrigo or tinea cruris.  He has been using nystatin powder with some improvement but without resolution.    Plan: The plan is to add fluconazole 150 mg orally once a week for 4 weeks.

## 2014-04-17 NOTE — Assessment & Plan Note (Signed)
Lab Results  Component Value Date   HGBA1C >14.0 04/17/2014   HGBA1C >14.0 01/09/2014   HGBA1C >14.0 09/14/2013     Assessment: Diabetes control: poor control (HgbA1C >9%) Progress toward A1C goal:  unchanged Comments: Diabetes is very poorly controlled on Lantus insulin 25 units daily and metformin 1000 mg twice a day.  Patient acknowledges that he misses some doses of his Lantus and that he is not following a good diabetic eating plan  Plan: Medications:  Increase Lantus insulin to a dose of 35 units daily; continue metformin 1000 mg twice a day Home glucose monitoring: Frequency: 3 times a day Timing: before meals Instruction/counseling given: reminded to get eye exam Educational resources provided: brochure Self management tools provided: copy of home glucose meter download, home glucose logbook Other plans: Refer to diabetes educator Debera Lat; retinal scan today; I discussed at length with patient the importance of adherence to his medication and diet, and the risks/potential consequences of poor control of his diabetes.

## 2014-04-17 NOTE — Assessment & Plan Note (Addendum)
Lipids:    Component Value Date/Time   CHOL 229* 01/24/2013 1159   TRIG 120 01/24/2013 1159   HDL 34* 01/24/2013 1159   LDLCALC 171* 01/24/2013 1159   VLDL 24 01/24/2013 1159   CHOLHDL 6.7 01/24/2013 1159    Assessment: Patient reports not recently taking his pravastatin.  Plan: Check a lipid panel today; I advised patient to refill his pravastatin and take it as prescribed.

## 2014-04-17 NOTE — Progress Notes (Signed)
Medical Nutrition Therapy:  Appt start time: 1000 end time:  0932. 1st visit 2015, last visit 01/2013 Assessment:  Primary concerns today: Blood sugar control.  Patient seen to today to assist with blood sugar control. He verbalizes the hardest things about dealing with his diabetes is "having it", taking all the medicine he has to take and changing habits he has been doing for years. He credits himself with having mastered several dietary changes, however feels his biggest challenge right now is his intake of coke and also decreasing or eliminating it will have the biggest impact on his blood sugar and his overall health.   His reported intake includes some high salt and fat food choices he is thinking about changing. [name] His meter download shows mostly 300 blood sugars.     Progress Towards Goal(s):  In progress.   Nutritional Diagnosis:  NB 1.6 limited adherence to nutrition related recommendations as related to reported intake of regular sodas and sweetened beverages as evidenced by his report and diet recall NB-1.4 Self-monitoring deficit As related to reported complacency in checking blood sugars vs oscilating acceptance of diabetes self care needs is improving  As evidenced by his having his meter at his visit today and the meter download showing weekly self monitoring in past month.    Intervention:  Nutrition re-education concerning reasons for signs and symptoms of high blood sugar and goals setting/healht coaching used in assisted patient in make diabetes meal planning lifestyle changes.  Coordination of care- patient is interested in extended release metformin to assist him with medication adherence.   Monitoring/Evaluation:  Dietary intake, exercise, blood sugars, and body weight in 3 week(s).

## 2014-04-18 ENCOUNTER — Encounter: Payer: Self-pay | Admitting: *Deleted

## 2014-04-18 LAB — CBC WITH DIFFERENTIAL/PLATELET
BASOS ABS: 0 10*3/uL (ref 0.0–0.1)
BASOS PCT: 0 % (ref 0–1)
EOS PCT: 1 % (ref 0–5)
Eosinophils Absolute: 0.1 10*3/uL (ref 0.0–0.7)
HCT: 45.4 % (ref 39.0–52.0)
Hemoglobin: 14.9 g/dL (ref 13.0–17.0)
LYMPHS PCT: 26 % (ref 12–46)
Lymphs Abs: 1.9 10*3/uL (ref 0.7–4.0)
MCH: 27.4 pg (ref 26.0–34.0)
MCHC: 32.8 g/dL (ref 30.0–36.0)
MCV: 83.6 fL (ref 78.0–100.0)
Monocytes Absolute: 0.7 10*3/uL (ref 0.1–1.0)
Monocytes Relative: 9 % (ref 3–12)
Neutro Abs: 4.7 10*3/uL (ref 1.7–7.7)
Neutrophils Relative %: 64 % (ref 43–77)
Platelets: 159 10*3/uL (ref 150–400)
RBC: 5.43 MIL/uL (ref 4.22–5.81)
RDW: 13.8 % (ref 11.5–15.5)
WBC: 7.3 10*3/uL (ref 4.0–10.5)

## 2014-04-18 LAB — MICROALBUMIN / CREATININE URINE RATIO
Creatinine, Urine: 179.7 mg/dL
MICROALB/CREAT RATIO: 62.9 mg/g — AB (ref 0.0–30.0)
Microalb, Ur: 11.3 mg/dL — ABNORMAL HIGH (ref <2.0)

## 2014-04-18 NOTE — Telephone Encounter (Signed)
That is OK with me

## 2014-04-24 ENCOUNTER — Ambulatory Visit: Payer: Self-pay | Admitting: Internal Medicine

## 2014-05-02 ENCOUNTER — Ambulatory Visit (INDEPENDENT_AMBULATORY_CARE_PROVIDER_SITE_OTHER): Payer: Self-pay | Admitting: Internal Medicine

## 2014-05-02 ENCOUNTER — Encounter: Payer: Self-pay | Admitting: Dietician

## 2014-05-02 ENCOUNTER — Encounter: Payer: Self-pay | Admitting: Internal Medicine

## 2014-05-02 ENCOUNTER — Ambulatory Visit: Payer: Self-pay | Admitting: Dietician

## 2014-05-02 VITALS — BP 189/115 | HR 95 | Temp 97.8°F | Ht 73.0 in | Wt 303.0 lb

## 2014-05-02 DIAGNOSIS — R21 Rash and other nonspecific skin eruption: Secondary | ICD-10-CM

## 2014-05-02 DIAGNOSIS — R229 Localized swelling, mass and lump, unspecified: Secondary | ICD-10-CM

## 2014-05-02 DIAGNOSIS — IMO0002 Reserved for concepts with insufficient information to code with codable children: Secondary | ICD-10-CM

## 2014-05-02 DIAGNOSIS — E1165 Type 2 diabetes mellitus with hyperglycemia: Secondary | ICD-10-CM

## 2014-05-02 DIAGNOSIS — R1909 Other intra-abdominal and pelvic swelling, mass and lump: Secondary | ICD-10-CM

## 2014-05-02 DIAGNOSIS — I10 Essential (primary) hypertension: Secondary | ICD-10-CM

## 2014-05-02 LAB — GLUCOSE, CAPILLARY: GLUCOSE-CAPILLARY: 189 mg/dL — AB (ref 70–99)

## 2014-05-02 MED ORDER — INSULIN GLARGINE 100 UNIT/ML SOLOSTAR PEN: PEN_INJECTOR | SUBCUTANEOUS | Status: DC

## 2014-05-02 NOTE — Progress Notes (Signed)
Subjective:   Patient ID: Terry Parks male   DOB: Aug 18, 1955 58 y.o.   MRN: [id number]  HPI: [name] is a 58 y.o. male w/ PMHx of DM type II, HTN, HLD, BPH, h/o renal stones, OSA, presents to the clinic today for a follow-up visit for a groin rash, nodule, elevated BP, and change in insulin dosing. He was last seen in the clinic on 04/17/14 by Dr. Marinda Elk who gave him fluconazole for his groin rash and doxycycline for his suspected abscess. He claims these two issues have almost completely resolved. His abscess is no longer causing him discomfort and is not draining any discharge. He continues to have elevated blood pressure today as he states he did not take his BP prior to his clinic visit. No symptoms of headaches or vision changes. The patient also had his Lantus increased to 35 units last visit and says his blood sugars are improved, but still in the 200's (never lower). Denies any symptoms of low blood sugar; no tremulousness, diaphoresis, dizziness, or palpitations. No further issues today.   Past Medical History  Diagnosis Date  . Diabetes mellitus 04/08/2008  . Hypertension   . Hyperlipemia   . Obstructive sleep apnea 03/06/2008    Sleep study 03/06/08 showed severe OSA/hypopnea syndrome, with successful CPAP titration to 13 CWP using a medium ResMed Mirage Quattro full face mask with heated humidifier.   . Cardiomegaly     Mild by CXR 11/04/04  . BPH (benign prostatic hypertrophy)     Massive BPH noted on cystoscopy 1/23/ 2012 by Dr. Risa Grill.  . Renal calculus 05/29/2010    CT scan of abdomen/pelvis on 05/29/2010 showed an obstructing approximate 1-2 mm calculus at the left UVJ, and an approximate 1-2 mm left lower pole renal calculus.   Patient had continuing severe pain , and an elevation of his serum creatinine to a value of 1.75 on 06/06/2010.  The stone had apparently passed and was not seen on repeat CT 06/08/2010.  [name] Headache(784.0)   . Allergic rhinitis   . Neck  pain   . Abscess of groin   . Abscessed tooth   . Hypertensive cardiopathy 03/01/2006    2-D echocardiogram 02/01/2012 showed moderate LVH, mildly to moderately reduced left ventricular systolic function with an estimated ejection fraction of 40-45%, and diffuse hypokinesis.  A nuclear medicine stress study done 01/31/2012 showed no reversible ischemia, a small mid anterior wall fixed defect/infarct, and ejection fraction 42%.       Current Outpatient Prescriptions  Medication Sig Dispense Refill  . aspirin 81 MG EC tablet Take 1 tablet (81 mg total) by mouth daily. (Patient not taking: Reported on 04/17/2014) 30 tablet 6  . atenolol (TENORMIN) 100 MG tablet Take 1 tablet (100 mg total) by mouth daily. 30 tablet 6  . cromolyn (OPTICROM) 4 % ophthalmic solution Place 1-2 drops into both eyes 4 (four) times daily. (Patient not taking: Reported on 04/17/2014) 10 mL 1  . doxycycline (VIBRAMYCIN) 100 MG capsule Take 1 capsule (100 mg total) by mouth 2 (two) times daily. 20 capsule 0  . fluconazole (DIFLUCAN) 150 MG tablet Take 1 tablet (150 mg total) by mouth once a week. 4 tablet 0  . furosemide (LASIX) 40 MG tablet Take 1 tablet (40 mg total) by mouth daily. (Patient not taking: Reported on 04/17/2014) 30 tablet 1  . gabapentin (NEURONTIN) 300 MG capsule Take 1 capsule (300 mg total) by mouth 2 (two) times daily. 60 capsule 1  .  glucose blood (TRUETRACK TEST) test strip Use as directed to test blood sugar three times a day before meals. 100 each 1  . Insulin Glargine (LANTUS SOLOSTAR) 100 UNIT/ML Solostar Pen Take 25 units subcutaneously at bedtime everyday. 15 mL 3  . Ipratropium-Albuterol (COMBIVENT) 20-100 MCG/ACT AERS respimat Inhale 1 puff into the lungs every 6 (six) hours as needed for wheezing or shortness of breath. 4 g 6  . Lancets MISC Use to test blood sugar three times a day before meals. 100 each 1  . metFORMIN (GLUCOPHAGE) 1000 MG tablet Take 1 tablet (1,000 mg total) by mouth 2 (two) times  daily with a meal. 60 tablet 6  . nystatin (MYCOSTATIN/NYSTOP) 100000 UNIT/GM POWD Apply to the affected areas twice daily 1 Bottle 0  . olmesartan (BENICAR) 20 MG tablet Take 1 tablet (20 mg total) by mouth daily. 30 tablet 3  . pravastatin (PRAVACHOL) 40 MG tablet Take 1 tablet once daily at bedtime. (Patient not taking: Reported on 04/17/2014) 30 tablet 5  . sildenafil (VIAGRA) 50 MG tablet Take 1 tablet (50 mg total) by mouth daily as needed for erectile dysfunction (Take 1 hour before sexual activity; do not take more than one dose in a 24 hour period.). 10 tablet 0  . triamcinolone cream (KENALOG) 0.1 % Apply 1 application topically 2 (two) times daily. Apply thin layer to rash on arms for 2 weeks. 30 g 1  . [DISCONTINUED] albuterol (PROVENTIL HFA;VENTOLIN HFA) 108 (90 BASE) MCG/ACT inhaler Inhale 1-2 puffs into the lungs every 6 (six) hours as needed for wheezing or shortness of breath.    . [DISCONTINUED] chlorpheniramine (CHLOR-TRIMETON) 4 MG tablet Take 1 tablet (4 mg total) by mouth 2 (two) times daily as needed for allergies. 30 tablet 0   No current facility-administered medications for this visit.    Review of Systems: General: Denies fever, chills, diaphoresis, appetite change and fatigue.  Respiratory: Denies SOB, DOE, cough, and wheezing.   Cardiovascular: Denies chest pain and palpitations.  Gastrointestinal: Denies nausea, vomiting, abdominal pain, and diarrhea.  Genitourinary: Denies dysuria, increased frequency, and flank pain. Endocrine: Denies hot or cold intolerance. Musculoskeletal: Denies myalgias, back pain, joint swelling, arthralgias and gait problem.  Skin: Positive for groin rash (improving). Denies pallor and wounds.  Neurological: Denies dizziness, seizures, syncope, weakness, lightheadedness, numbness and headaches.  Psychiatric/Behavioral: Denies mood changes, and sleep disturbances.   Objective:   Physical Exam: Filed Vitals:   05/02/14 0920  BP:  189/115  Pulse: 95  Temp: 97.8 F (36.6 C)  TempSrc: Oral  Height: 6\' 1"  (1.854 m)  Weight: 303 lb (137.44 kg)  SpO2: 100%    General: Obese AA male, alert, cooperative, NAD. HEENT: PERRL, EOMI. Moist mucus membranes Neck: Full range of motion without pain, supple, no lymphadenopathy or carotid bruits Lungs: Clear to ascultation bilaterally, normal work of respiration, no wheezes, rales, rhonchi Heart: RRR, no murmurs, gallops, or rubs Abdomen: Soft, non-tender, non-distended, BS + Genitourinary: Discoloration seen in the groin w/ mild excoriation, no erythema, discharge, or tenderness. Small right-sided nodule at the base of the scrotum, non-tender, no discharge or erythema.  Extremities: No cyanosis, clubbing, or edema Neurologic: Alert & oriented X3, cranial nerves II-XII intact, strength grossly intact, sensation intact to light touch   Assessment & Plan:   Please see problem based assessment and plan.

## 2014-05-02 NOTE — Assessment & Plan Note (Addendum)
Dark discoloration seen in the groin w/ mild excoriations, otherwise improved according to the patient. Was given Fluconazole 150 mg po once a week for 4 weeks starting on 04/17/14.  -RTC in 2-4 weeks for further observation

## 2014-05-02 NOTE — Assessment & Plan Note (Signed)
Lab Results  Component Value Date   HGBA1C >14.0 04/17/2014   HGBA1C >14.0 01/09/2014   HGBA1C >14.0 09/14/2013     Assessment: Diabetes control: poor control (HgbA1C >9%) Progress toward A1C goal:  unchanged Comments: Recent increase in Lantus to 35 units. Says his blood sugars are improved at home, average ~240 according to home monitoring. Denies any symptoms of hypoglycemia.   Plan: Medications:  Increase Lantus to 40 units daily. Home glucose monitoring: Frequency: once a day Timing: before breakfast Instruction/counseling given: discussed the need for weight loss Educational resources provided: brochure Self management tools provided: copy of home glucose meter download Other plans: Was supposed to meet with Debera Lat today but left before his appointment. Will see her at his next clinic visit in 2-4 weeks.

## 2014-05-02 NOTE — Assessment & Plan Note (Signed)
Patient w/ small nodule present on the right base of his scrotum. Appeared to have been an abscess, given a course of Doxycycline and he claims this has improved significantly. No drainage, tenderness, erythema, or induration. Hard to palpation but significantly improved, according to the patient.  -No further management at this time.

## 2014-05-02 NOTE — Progress Notes (Signed)
Patient left without being seen today. CDE called him to reschedule and follow up on his goal of drinking less regular soda set at his last visit.  Patient says he will reschedule on same day he sees the doctor next month,  Regarding his goal to drink more water and tea in stead of soda he reports he id well with that and attributes his lower blood sugars and less urination at night to his success with drinking more unsweetened tea and water. He was puzzled by his weight gain. CDE explained fluid and blood sugar shifts that can cause weight gain. Patient verbalized understanding.

## 2014-05-02 NOTE — Assessment & Plan Note (Signed)
BP Readings from Last 3 Encounters:  05/02/14 189/115  04/17/14 166/106  01/23/14 145/101    Lab Results  Component Value Date   NA 139 04/17/2014   K 4.1 04/17/2014   CREATININE 1.04 04/17/2014    Assessment: Blood pressure control: severely elevated Progress toward BP goal:  deteriorated Comments: Patient takes Lasix 40 mg daily, Benicar 20 mg daily, and Atenolol 100 mg daily. States he has not taken any of these yet this morning. No symptoms; denies headache or vision changes.   Plan: Medications:  continue current medications as above Educational resources provided: brochure Self management tools provided: home blood pressure logbook Other plans: Return to clinic in 2-4 weeks. Advised patient to take his medications prior to his clinic visit in order to determine efficacy of his regimen. He understood.

## 2014-05-02 NOTE — Patient Instructions (Signed)
General Instructions:  1. Please schedule a follow up appointment for 2-4 weeks.   2. Please take all medications as prescribed.   INCREASE LANTUS INSULIN TO 40 UNITS   3. If you have worsening of your symptoms or new symptoms arise, please call the clinic [phone]), or go to the ER immediately if symptoms are severe.  You have done a great job in taking all your medications. I appreciate it very much. Please continue doing that.  Please bring your medicines with you each time you come to clinic.  Medicines may include prescription medications, over-the-counter medications, herbal remedies, eye drops, vitamins, or other pills.   Progress Toward Treatment Goals:  Treatment Goal 05/02/2014  Hemoglobin A1C unchanged  Blood pressure deteriorated    Self Care Goals & Plans:  Self Care Goal 05/02/2014  Manage my medications take my medicines as prescribed; bring my medications to every visit; refill my medications on time  Monitor my health keep track of my blood glucose; bring my glucose meter and log to each visit  Eat healthy foods drink diet soda or water instead of juice or soda; eat more vegetables; eat foods that are low in salt; eat baked foods instead of fried foods; eat fruit for snacks and desserts  Be physically active -  Meeting treatment goals maintain the current self-care plan    Home Blood Glucose Monitoring 05/02/2014  Check my blood sugar once a day  When to check my blood sugar before breakfast     Care Management & Community Referrals:  Referral 05/02/2014  Referrals made for care management support none needed  Referrals made to community resources none

## 2014-05-06 NOTE — Progress Notes (Signed)
Internal Medicine Clinic Attending  Case discussed with Dr. Jones soon after the resident saw the patient.  We reviewed the resident's history and exam and pertinent patient test results.  I agree with the assessment, diagnosis, and plan of care documented in the resident's note. 

## 2014-05-07 ENCOUNTER — Encounter: Payer: Self-pay | Admitting: Dietician

## 2014-05-23 ENCOUNTER — Ambulatory Visit: Payer: Self-pay | Admitting: Internal Medicine

## 2014-06-05 ENCOUNTER — Other Ambulatory Visit: Payer: Self-pay | Admitting: *Deleted

## 2014-06-05 DIAGNOSIS — E118 Type 2 diabetes mellitus with unspecified complications: Principal | ICD-10-CM

## 2014-06-05 DIAGNOSIS — E1165 Type 2 diabetes mellitus with hyperglycemia: Secondary | ICD-10-CM

## 2014-06-05 DIAGNOSIS — IMO0002 Reserved for concepts with insufficient information to code with codable children: Secondary | ICD-10-CM

## 2014-06-05 DIAGNOSIS — I5022 Chronic systolic (congestive) heart failure: Secondary | ICD-10-CM

## 2014-06-06 MED ORDER — OLMESARTAN MEDOXOMIL 20 MG PO TABS: 20.0000 mg | ORAL_TABLET | Freq: Every day | ORAL | Status: DC

## 2014-06-06 MED ORDER — GABAPENTIN 300 MG PO CAPS: 300.0000 mg | ORAL_CAPSULE | Freq: Two times a day (BID) | ORAL | Status: DC

## 2014-06-10 NOTE — Telephone Encounter (Signed)
Called to pharmacy 

## 2014-07-12 ENCOUNTER — Other Ambulatory Visit: Payer: Self-pay | Admitting: Occupational Medicine

## 2014-07-12 ENCOUNTER — Emergency Department (HOSPITAL_COMMUNITY)
Admission: EM | Admit: 2014-07-12 | Discharge: 2014-07-12 | Disposition: A | Discharge status: Home / Self Care | Payer: Self-pay | Source: Home / Self Care

## 2014-07-12 ENCOUNTER — Ambulatory Visit: Payer: Self-pay

## 2014-07-12 DIAGNOSIS — R52 Pain, unspecified: Secondary | ICD-10-CM

## 2014-07-17 ENCOUNTER — Telehealth: Payer: Self-pay | Admitting: Internal Medicine

## 2014-07-17 NOTE — Telephone Encounter (Signed)
Call to patient to confirm appointment for 07/18/14 at 9:45 lmtcb

## 2014-07-18 ENCOUNTER — Encounter: Payer: Self-pay | Admitting: Internal Medicine

## 2014-07-18 ENCOUNTER — Ambulatory Visit (INDEPENDENT_AMBULATORY_CARE_PROVIDER_SITE_OTHER): Payer: Self-pay | Admitting: Internal Medicine

## 2014-07-18 VITALS — BP 154/101 | HR 70 | Temp 97.2°F | Wt 313.4 lb

## 2014-07-18 DIAGNOSIS — R21 Rash and other nonspecific skin eruption: Secondary | ICD-10-CM

## 2014-07-18 DIAGNOSIS — E1165 Type 2 diabetes mellitus with hyperglycemia: Secondary | ICD-10-CM

## 2014-07-18 DIAGNOSIS — IMO0002 Reserved for concepts with insufficient information to code with codable children: Secondary | ICD-10-CM

## 2014-07-18 DIAGNOSIS — I10 Essential (primary) hypertension: Secondary | ICD-10-CM

## 2014-07-18 DIAGNOSIS — I5022 Chronic systolic (congestive) heart failure: Secondary | ICD-10-CM

## 2014-07-18 DIAGNOSIS — R0602 Shortness of breath: Secondary | ICD-10-CM

## 2014-07-18 DIAGNOSIS — E785 Hyperlipidemia, unspecified: Secondary | ICD-10-CM

## 2014-07-18 DIAGNOSIS — G4733 Obstructive sleep apnea (adult) (pediatric): Secondary | ICD-10-CM

## 2014-07-18 LAB — LIPID PANEL
Cholesterol: 152 mg/dL (ref 0–200)
HDL: 30 mg/dL — AB (ref >=40)
LDL Cholesterol: 105 mg/dL — ABNORMAL HIGH (ref 0–99)
TRIGLYCERIDES: 86 mg/dL (ref <150)
Total CHOL/HDL Ratio: 5.1 Ratio
VLDL: 17 mg/dL (ref 0–40)

## 2014-07-18 LAB — COMPLETE METABOLIC PANEL WITH GFR
ALK PHOS: 62 U/L (ref 39–117)
ALT: 24 U/L (ref 0–53)
AST: 25 U/L (ref 0–37)
Albumin: 3.8 g/dL (ref 3.5–5.2)
BILIRUBIN TOTAL: 0.8 mg/dL (ref 0.2–1.2)
BUN: 15 mg/dL (ref 6–23)
CO2: 22 mEq/L (ref 19–32)
Calcium: 9 mg/dL (ref 8.4–10.5)
Chloride: 103 mEq/L (ref 96–112)
Creat: 1.08 mg/dL (ref 0.50–1.35)
GFR, EST NON AFRICAN AMERICAN: 76 mL/min
GFR, Est African American: 88 mL/min
GLUCOSE: 153 mg/dL — AB (ref 70–99)
POTASSIUM: 3.9 meq/L (ref 3.5–5.3)
SODIUM: 137 meq/L (ref 135–145)
Total Protein: 6.5 g/dL (ref 6.0–8.3)

## 2014-07-18 LAB — POCT GLYCOSYLATED HEMOGLOBIN (HGB A1C): HEMOGLOBIN A1C: 8.5

## 2014-07-18 LAB — GLUCOSE, CAPILLARY: Glucose-Capillary: 152 mg/dL — ABNORMAL HIGH (ref 70–99)

## 2014-07-18 LAB — BRAIN NATRIURETIC PEPTIDE: B Natriuretic Peptide: 554.3 pg/mL — ABNORMAL HIGH (ref 0.0–100.0)

## 2014-07-18 MED ORDER — INSULIN GLARGINE 100 UNIT/ML SOLOSTAR PEN: 44.0000 [IU] | PEN_INJECTOR | Freq: Every day | SUBCUTANEOUS | Status: DC

## 2014-07-18 NOTE — Assessment & Plan Note (Signed)
Lab Results  Component Value Date   BNP 554.3* 07/18/2014     Assessment: Patient reports occasional very brief episodes of shortness of breath which occur approximately daily and which last for only a few seconds and then resolve.  These have been happening for the past 6 months, and there has been no progression of his symptoms.  The episodes are nonexertional, and patient denies orthopnea or PND.  His lung exam is clear, and he has no lower extremity edema, so his exam does not suggest decompensated heart failure.  His BNP level is elevated, but I do not have a prior value for comparison.  Plan: I discussed with patient the importance of coming in right away if he has any progression of his symptoms.  I advised him to refill and resume his atenolol and furosemide.  If his symptoms progress, further cardiovascular evaluation may be warranted including a repeat 2-D echocardiogram.

## 2014-07-18 NOTE — Assessment & Plan Note (Signed)
Assessment: Patient reports that he saw his ophthalmologist yesterday.  Plan: A copy of the visit note was requested.

## 2014-07-18 NOTE — Patient Instructions (Addendum)
Increase Lantus insulin to a dose of 44 units once daily. Please refill and take your medications as prescribed.

## 2014-07-18 NOTE — Assessment & Plan Note (Signed)
BP Readings from Last 3 Encounters:  07/18/14 154/101  05/02/14 189/115  04/17/14 166/106    Lab Results  Component Value Date   NA 139 04/17/2014   K 4.1 04/17/2014   CREATININE 1.04 04/17/2014    Assessment: Blood pressure control: moderately elevated Progress toward BP goal:  unchanged Comments: Blood pressure is moderately elevated today, but patient has been out of his atenolol and furosemide recently.  Plan: Medications:  Continue atenolol 100 mg daily, olmesartan (Benicar) 20 mg daily, and furosemide 40 mg daily.  I advised patient to have the atenolol and furosemide refilled and resume taking those medications at the prescribed dose. Educational resources provided: brochure Self management tools provided: home blood pressure logbook

## 2014-07-18 NOTE — Assessment & Plan Note (Signed)
Assessment: Patient patient had a hyperpigmented maculopapular rash on his forearms when I saw him in December consistent with eczema.  He reports significant improvement with triamcinolone 0.1% cream topically twice a day for 2 weeks.  The scan now appears dry but the rash appears to be resolved.  Plan: I advised patient to use a moisturizing lotion regularly on both arms.

## 2014-07-18 NOTE — Assessment & Plan Note (Signed)
Lipids:    Component Value Date/Time   CHOL 228* 04/17/2014 1111   TRIG 118 04/17/2014 1111   HDL 39* 04/17/2014 1111   LDLCALC 165* 04/17/2014 1111   VLDL 24 04/17/2014 1111   CHOLHDL 5.8 04/17/2014 1111    Assessment: Patient reports that he has been taking pravastatin 40 mg daily as prescribed since December.  He reports no apparent side effects.  Plan: Check a lipid panel today; continue pravastatin 40 mg daily pending the result.

## 2014-07-18 NOTE — Progress Notes (Signed)
   Subjective:    Patient ID: Terry Parks, male    DOB: 02/26/1956, 59 y.o.   MRN: [id number]  HPI Patient returns for management of his uncontrolled diabetes mellitus, hypertension, chronic systolic heart failure, hyperlipidemia, and other chronic medical problems.  He has recently been out of his atenolol and furosemide, but otherwise reports that he has been compliant with his medications including his Lantus insulin and metformin.  His Lantus insulin dose was increased to 40 units daily on 05/02/2014.  Patient reports improvement in his home blood glucose measurements; his glucometer record shows an average over the past month of 167, with a range from 124 to 224.  Patient reports that he saw his eye doctor yesterday.  He reports occasional brief episodes of shortness of breath which last for a few seconds only and then resolve; these have been occurring about once a day for the past 6 months.  He denies exertional dyspnea, orthopnea, or PND; he denies any prolonged episodes of dyspnea.  He has not been wearing his CPAP, but reports that since he is relating less frequently at night he will resume wearing CPAP.  Patient reports that he fell in February and bruised his left lower chest, and was seen at Urgent Care on 07/12/2014; he had rib x-rays which showed no fracture, and reports that he has been taking naproxen 220 mg twice a day as needed for pain.  He reports significant improvement in the pain, and is not using the naproxen regularly.  Following his last visit to see me on 04/17/2014, patient reports complete resolution of his groin rash after taking fluconazole 150 mg orally once a week for 4 weeks.  He also reports resolution of the small tender nodule in his groin following treatment with doxycycline 100 mg twice a day for 10 days.  He reports that the rash on his forearms improved significantly with topical triamcinolone 0.1% cream which he has occasionally been using on an as-needed basis.  He  has not yet been able to afford Viagra for treatment of his erectile dysfunction, but he would like to leave the medication on his list for now.      Review of Systems  Constitutional: Negative for fever, chills and diaphoresis.  Respiratory: Positive for shortness of breath (Occasional brief episodes of shortness of breath).   Cardiovascular: Negative for chest pain and leg swelling.  Gastrointestinal: Negative for nausea, vomiting, abdominal pain, blood in stool and anal bleeding.  Endocrine: Negative for polydipsia, polyphagia and polyuria.  Genitourinary: Negative for dysuria and frequency.  Neurological: Negative for dizziness, syncope, weakness, light-headedness and numbness.  Psychiatric/Behavioral: Negative for dysphoric mood.    I reviewed and updated the medication list, allergies, past medical history, past surgical history, family history, and social history.      Objective:   Physical Exam  Constitutional: No distress.  Cardiovascular: Normal rate and regular rhythm.  Exam reveals no gallop and no friction rub.   No murmur heard. No lower extremity edema  Pulmonary/Chest: Effort normal and breath sounds normal. No respiratory distress. He has no wheezes. He has no rales.  Abdominal: Soft. Bowel sounds are normal. He exhibits no distension. There is no tenderness. There is no rebound and no guarding.  Skin:  The maculopapular rash on patient's forearms has resolved; the skin appears dry.        Assessment & Plan:

## 2014-07-18 NOTE — Assessment & Plan Note (Signed)
Assessment:  Patient has a CPAP machine but reports that he has not been wearing it mainly because of getting up frequently at night to urinate.  Since his frequency of urination has improved as his blood sugar is better controlled, he reports that he is willing to resume CPAP.  Plan: I advised patient of the importance of resuming his CPAP.

## 2014-07-18 NOTE — Assessment & Plan Note (Addendum)
Lab Results  Component Value Date   HGBA1C 8.5 07/18/2014   HGBA1C >14.0 04/17/2014   HGBA1C >14.0 01/09/2014     Assessment: Diabetes control: fair control Progress toward A1C goal:  improved Comments: Patient's hemoglobin A1c has substantially improved on his current regimen of metformin 1000 mg twice a day and Lantus insulin 40 units daily.  He reports being much more compliant with his medications.   Plan: Medications:  increase Lantus insulin to 44 units once daily; continue metformin 1000 mg twice a day Home glucose monitoring: Frequency: once a day Timing: before breakfast Instruction/counseling given: Although patient has never had low blood sugars, I provided a handout on symptoms of hypoglycemia and appropriate actions and discussed this with him.  I advised him to let me know if he has any low blood sugar measurements. Educational resources provided: brochure Self management tools provided: copy of home glucose meter download

## 2014-07-31 ENCOUNTER — Encounter: Payer: Self-pay | Admitting: *Deleted

## 2014-08-01 ENCOUNTER — Other Ambulatory Visit: Payer: Self-pay | Admitting: *Deleted

## 2014-08-01 DIAGNOSIS — I5022 Chronic systolic (congestive) heart failure: Secondary | ICD-10-CM

## 2014-08-01 MED ORDER — FUROSEMIDE 40 MG PO TABS: 40.0000 mg | ORAL_TABLET | Freq: Every day | ORAL | Status: DC

## 2014-08-01 NOTE — Telephone Encounter (Signed)
As this patient was seen by Dr. Marinda Elk on 07/18/14 and deemed necessary for their treatment as documented in the EHR, I will refill this prescription for Lasix.

## 2014-08-05 NOTE — Telephone Encounter (Signed)
Called to pharm 

## 2014-09-11 ENCOUNTER — Ambulatory Visit (INDEPENDENT_AMBULATORY_CARE_PROVIDER_SITE_OTHER): Payer: Self-pay | Admitting: Internal Medicine

## 2014-09-11 ENCOUNTER — Encounter: Payer: Self-pay | Admitting: Internal Medicine

## 2014-09-11 VITALS — BP 162/98 | HR 75 | Temp 97.7°F | Wt 315.6 lb

## 2014-09-11 DIAGNOSIS — E785 Hyperlipidemia, unspecified: Secondary | ICD-10-CM

## 2014-09-11 DIAGNOSIS — I10 Essential (primary) hypertension: Secondary | ICD-10-CM

## 2014-09-11 DIAGNOSIS — IMO0002 Reserved for concepts with insufficient information to code with codable children: Secondary | ICD-10-CM

## 2014-09-11 DIAGNOSIS — E1142 Type 2 diabetes mellitus with diabetic polyneuropathy: Secondary | ICD-10-CM

## 2014-09-11 DIAGNOSIS — E118 Type 2 diabetes mellitus with unspecified complications: Principal | ICD-10-CM

## 2014-09-11 DIAGNOSIS — E1165 Type 2 diabetes mellitus with hyperglycemia: Secondary | ICD-10-CM

## 2014-09-11 DIAGNOSIS — I5022 Chronic systolic (congestive) heart failure: Secondary | ICD-10-CM

## 2014-09-11 DIAGNOSIS — Z794 Long term (current) use of insulin: Secondary | ICD-10-CM

## 2014-09-11 DIAGNOSIS — R0602 Shortness of breath: Secondary | ICD-10-CM

## 2014-09-11 MED ORDER — ATORVASTATIN CALCIUM 40 MG PO TABS: 40.0000 mg | ORAL_TABLET | Freq: Every day | ORAL | Status: DC

## 2014-09-11 MED ORDER — CARVEDILOL 3.125 MG PO TABS: 3.1250 mg | ORAL_TABLET | Freq: Two times a day (BID) | ORAL | Status: DC

## 2014-09-11 MED ORDER — GABAPENTIN 300 MG PO CAPS: 300.0000 mg | ORAL_CAPSULE | Freq: Three times a day (TID) | ORAL | Status: DC

## 2014-09-11 NOTE — Assessment & Plan Note (Signed)
Patient with significantly more dyspnea than previously noted. I am concerned since now that he is getting dyspnea at rest that this may be an anginal equivalent. Also given his risk factors of HTN, uncontrolled Type 2 DM, and hyperlipidemia which place him at higher risk for a cardiac event. He also has 1+ pitting edema in his lower extremities on exam which was not present before concerning for worsening of his heart failure. Will have him referred to cardiology to determine if he needs stress testing.  - Referral placed to cardiology - Repeat 2D Echo ordered  - Switch Atenolol to Coreg 3.125 mg BID - Continue Lasix 30 mg daily  - Continue Olmesartan 20 mg daily

## 2014-09-11 NOTE — Patient Instructions (Addendum)
It was a pleasure seeing you today, Mr. Cortright.   - Increase Gabapentin to 300 mg three times daily for your leg/foot pain. If this does not help your pain in 1 week then please call the clinic and we can increase your dose.  - Make sure to take your blood pressure medications every day - I am referring you to a cardiologist because I am concerned about your shortness of breath. You will be called to make an appointment. I have also placed an order for an echocardiogram which will be scheduled.  - Please eat less sweets and fast food. Try to incorporate more fruits and vegetables in your diet. Making these diet changes will help your blood sugar control.  General Instructions:   Thank you for bringing your medicines today. This helps [place] keep you safe from mistakes.   Progress Toward Treatment Goals:  Treatment Goal 07/18/2014  Hemoglobin A1C improved  Blood pressure unchanged    Self Care Goals & Plans:  Self Care Goal 07/18/2014  Manage my medications take my medicines as prescribed; bring my medications to every visit; refill my medications on time  Monitor my health check my feet daily; keep track of my blood glucose; bring my glucose meter and log to each visit; keep track of my blood pressure; bring my blood pressure log to each visit  Eat healthy foods eat foods that are low in salt; eat baked foods instead of fried foods  Be physically active find an activity I enjoy  Meeting treatment goals -    Home Blood Glucose Monitoring 07/18/2014  Check my blood sugar once a day  When to check my blood sugar before breakfast     Care Management & Community Referrals:  Referral 07/18/2014  Referrals made for care management support -  Referrals made to community resources none

## 2014-09-11 NOTE — Progress Notes (Signed)
   Subjective:    Patient ID: Terry Parks, male    DOB: 09-28-1955, 59 y.o.   MRN: [id number]  HPI Terry Parks is a 59yo man with PMHx of HTN, chronic systolic HF, Type 2 DM, hyperlipidemia, and morbid obesity who presents today for an acute visit.  Patient states he is having burning/sharp pains in his feet and lower legs bilaterally. He states he was started on Gabapentin 300 mg twice a day at his last visit but this has "not touched the pain." He also describes numbness in his feet. 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 He also reports waking up at night with pain.   His last HbA1c was 8.5 on 07/18/14. He states his blood sugars have improved since his Lantus was increased to 44 units. He also takes Metformin 1000 mg BID.  Per review of his glucometer, his blood sugars have been in the 150s-250s. He denies any low blood sugars or hypoglycemic symptoms. He admits that his diet could be better as he eats sweets often.   Patient reports he is having dyspnea on exertion occasionally and even at rest. He denies any chest pain or palpitations.  He notes his legs are swollen more than usual and his socks feel tighter. Per review of his weights, his weight has increased by 12 lbs since Dec 2015. Prior to that his weight was around 295 lbs. He denies orthopnea and PND.    Review of Systems General: Denies fever, chills, night sweats, changes in appetite HEENT: Denies headaches, ear pain, changes in vision, rhinorrhea, sore throat CV: See HPI Pulm: Denies cough, wheezing GI: Denies abdominal pain, nausea, vomiting, diarrhea, constipation, melena, hematochezia GU: Denies dysuria, hematuria, frequency Msk: Denies muscle cramps, joint pains Neuro: Denies weakness Skin: Denies rashes, bruising    Objective:   Physical Exam General: sitting up in chair, NAD HEENT: [place]/AT, EOMI, sclera anicteric, mucus  membranes moist Neck: supple, no JVD appreciated CV: RRR, no m/g/r Pulm: CTA bilaterally, no crackles appreciated Abd: BS+, soft, obese, non-tender Ext: warm, 1+ pitting edema. Distal pulses 2+ bilaterally. No sores or ulcers present on his feet.  Neuro: alert and oriented x 3. Strength intact in upper and lower extremities. Sensation intact to light touch in both feet.      Assessment & Plan:  Please refer to A&P documentation.

## 2014-09-11 NOTE — Assessment & Plan Note (Addendum)
Lab Results  Component Value Date   HGBA1C 8.5 07/18/2014   HGBA1C >14.0 04/17/2014   HGBA1C >14.0 01/09/2014     Assessment: Diabetes control: fair control Progress toward A1C goal:  improved Comments: Much improved. His blood sugars are in 150-250 range. He denies any hypoglycemia.   Patient having significant peripheral neuropathy pain. He was started on Gabapentin 300 mg BID but this has not relieved his pain.   Plan: Medications:  Continue Lantus 44 units daily and Metformin 1000 mg BID Home glucose monitoring: Frequency: once a day Timing: before breakfast Instruction/counseling given: reminded to bring blood glucose meter & log to each visit, reminded to bring medications to each visit, discussed foot care, discussed the need for weight loss and discussed diet Other plans: - Increase Gabapentin to 300 mg TID. Pt instructed to call clinic in 1 week if pain not controlled. Can increase to 600 mg TID if pain not controlled.  - We discussed improving his diet to help lower his blood sugars even more. He agreed to work on cutting down on sweets and incorporating more fruits and vegetables.

## 2014-09-11 NOTE — Assessment & Plan Note (Signed)
Patient's ASCVD risk is >6.5% (38%). He should be on a higher intensity statin.  - Switch to atorvastatin 40 mg daily

## 2014-09-11 NOTE — Assessment & Plan Note (Signed)
BP Readings from Last 3 Encounters:  09/11/14 162/98  07/18/14 154/101  05/02/14 189/115    Lab Results  Component Value Date   NA 137 07/18/2014   K 3.9 07/18/2014   CREATININE 1.08 07/18/2014    Assessment: Blood pressure control: moderately elevated Progress toward BP goal:  unchanged Comments: BP initially 180/113 but on repeat came down to 162/98. Patient did not take his BP medications this morning because he normally takes them at [facility] and his appointment was scheduled earlier than that. He was instructed to take his medications as soon as possible.   Plan: Medications:  Will switch Atenolol to Coreg since there is more evidence to support use of Coreg in heart failure patients. Will start at Coreg 3.125 mg BID and titrate up as needed. Continue Lasix 40 mg daily and Olmesartan 20 mg daily  Other plans:  - BP recheck in 1 month

## 2014-09-12 NOTE — Progress Notes (Signed)
INTERNAL MEDICINE TEACHING ATTENDING ADDENDUM - Jashawna Reever, MD: I reviewed and discussed at the time of visit with the resident Dr. Rivet, the patient's medical history, physical examination, diagnosis and results of pertinent tests and treatment and I agree with the patient's care as documented.  

## 2014-09-13 ENCOUNTER — Telehealth (HOSPITAL_COMMUNITY): Payer: Self-pay | Admitting: Vascular Surgery

## 2014-09-23 ENCOUNTER — Telehealth: Payer: Self-pay | Admitting: *Deleted

## 2014-09-23 ENCOUNTER — Ambulatory Visit: Payer: Self-pay

## 2014-09-23 NOTE — Telephone Encounter (Signed)
Call made to pt-has 2D-Echo scheduled for Fri May 13th at 11am here at [facility].  Pt has also inquired about getting diabetic shoes, but shoes are not covered by the "orange card".  Message left on recorder for pt to contact me directly regarding shoes.Regenia Skeeter, Brad Lieurance Cassady5/9/20162:59 PM

## 2014-09-27 ENCOUNTER — Ambulatory Visit (HOSPITAL_COMMUNITY)
Admission: RE | Admit: 2014-09-27 | Discharge: 2014-09-27 | Disposition: A | Discharge status: Home / Self Care | Payer: Self-pay | Source: Ambulatory Visit | Attending: Internal Medicine | Admitting: Internal Medicine

## 2014-09-27 DIAGNOSIS — R0602 Shortness of breath: Secondary | ICD-10-CM | POA: Insufficient documentation

## 2014-09-27 DIAGNOSIS — I350 Nonrheumatic aortic (valve) stenosis: Secondary | ICD-10-CM | POA: Insufficient documentation

## 2014-09-27 DIAGNOSIS — E785 Hyperlipidemia, unspecified: Secondary | ICD-10-CM | POA: Insufficient documentation

## 2014-09-27 DIAGNOSIS — Z6841 Body Mass Index (BMI) 40.0 and over, adult: Secondary | ICD-10-CM | POA: Insufficient documentation

## 2014-09-27 DIAGNOSIS — E119 Type 2 diabetes mellitus without complications: Secondary | ICD-10-CM | POA: Insufficient documentation

## 2014-09-27 NOTE — Progress Notes (Signed)
  Echocardiogram 2D Echocardiogram has been performed.  Karle Desrosier FRANCES 09/27/2014, 12:25 PM

## 2014-10-16 ENCOUNTER — Other Ambulatory Visit: Payer: Self-pay | Admitting: *Deleted

## 2014-10-16 ENCOUNTER — Ambulatory Visit: Payer: Self-pay

## 2014-10-16 DIAGNOSIS — E0849 Diabetes mellitus due to underlying condition with other diabetic neurological complication: Secondary | ICD-10-CM

## 2014-10-16 MED ORDER — METFORMIN HCL 1000 MG PO TABS: 1000.0000 mg | ORAL_TABLET | Freq: Two times a day (BID) | ORAL | Status: DC

## 2014-10-16 NOTE — Telephone Encounter (Signed)
Pt has been out of meds for 3 weeks.

## 2014-10-24 NOTE — Telephone Encounter (Signed)
Open in error

## 2014-11-07 ENCOUNTER — Encounter: Payer: Self-pay | Admitting: Cardiovascular Disease

## 2014-11-07 ENCOUNTER — Ambulatory Visit (INDEPENDENT_AMBULATORY_CARE_PROVIDER_SITE_OTHER): Payer: Self-pay | Admitting: Cardiovascular Disease

## 2014-11-07 VITALS — BP 160/110 | HR 102 | Ht 73.0 in | Wt 307.5 lb

## 2014-11-07 DIAGNOSIS — I509 Heart failure, unspecified: Secondary | ICD-10-CM

## 2014-11-07 DIAGNOSIS — G4733 Obstructive sleep apnea (adult) (pediatric): Secondary | ICD-10-CM

## 2014-11-07 DIAGNOSIS — I10 Essential (primary) hypertension: Secondary | ICD-10-CM

## 2014-11-07 DIAGNOSIS — I5022 Chronic systolic (congestive) heart failure: Secondary | ICD-10-CM

## 2014-11-07 DIAGNOSIS — R0602 Shortness of breath: Secondary | ICD-10-CM

## 2014-11-07 DIAGNOSIS — I110 Hypertensive heart disease with heart failure: Secondary | ICD-10-CM

## 2014-11-07 DIAGNOSIS — E785 Hyperlipidemia, unspecified: Secondary | ICD-10-CM

## 2014-11-07 MED ORDER — CARVEDILOL 12.5 MG PO TABS: ORAL_TABLET | ORAL | Status: DC

## 2014-11-07 NOTE — Progress Notes (Signed)
Patient ID: Jakorey Mcconathy, male   DOB: 07-03-1955, 59 y.o.   MRN: [id number]     Cardiology Office Note   Date:  11/09/2014   ID:  Trevyn Lumpkin, DOB 10/31/1955, MRN [id number]  PCP:  Charlott Rakes, MD  Cardiologist:   [name], MD   Chief Complaint  Patient presents with  . Establish Care    no chest discomfort, swelling and pain bilateral of legs      History of Present Illness: Paulino Cork is a 59 y.o. male who presents for  Evaluation for congestive heart failure, OSA, hypertension and hyperlipidemia.   Mr. Deyton has functional class II exertional dyspnea but denies angina pectoris.   Dyspnea has worsened over the last few months and occasionally he becomes short of breath at rest.He has chronic lower extremity edema,  So worsening recently.[name] He feels tired all the time. On our office screening tool for sleep apnea he scored 18 points on the Epworth Sleepiness Scale. He is using a CPAP machine but has not had it tested recently. He believes it is not delivering the right pressure. He has not had palpitations or syncope. 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 He is morbidly obese with a BMI just over 40.  In 2013 evaluation showed a left ventricular ejection fraction of 40-45 percent by echocardiography , 42% by nuclear scintigraphy with minimal perfusion abnormalities Francis apical scar versus thinning and no ischemia). His most recent evaluation by echocardiography in May 2016 shows left ventricular ejection fraction normal at 60-65 percent, but there is severe left ventricular hypertrophy and severe left atrial dilatation.  His current treatment includes a very low dose of carvedilol but a decent dose of angiotensin receptor blocker as well as a loop diuretic, statin and aspirin. He has used sildenafil without side effects in the past.   Past Medical History  Diagnosis Date  . Diabetes mellitus 04/08/2008  . Hypertension   . Hyperlipemia   .  Obstructive sleep apnea 03/06/2008    Sleep study 03/06/08 showed severe OSA/hypopnea syndrome, with successful CPAP titration to 13 CWP using a medium ResMed Mirage Quattro full face mask with heated humidifier.   . Cardiomegaly     Mild by CXR 11/04/04  . BPH (benign prostatic hypertrophy)     Massive BPH noted on cystoscopy 1/23/ 2012 by Dr. Risa Grill.  . Renal calculus 05/29/2010    CT scan of abdomen/pelvis on 05/29/2010 showed an obstructing approximate 1-2 mm calculus at the left UVJ, and an approximate 1-2 mm left lower pole renal calculus.   Patient had continuing severe pain , and an elevation of his serum creatinine to a value of 1.75 on 06/06/2010.  The stone had apparently passed and was not seen on repeat CT 06/08/2010.  [name] Headache(784.0)   . Allergic rhinitis   . Neck pain   . Abscess of groin   . Abscessed tooth   . Hypertensive cardiopathy 03/01/2006    2-D echocardiogram 02/01/2012 showed moderate LVH, mildly to moderately reduced left ventricular systolic function with an estimated ejection fraction of 40-45%, and diffuse hypokinesis.  A nuclear medicine stress study done 01/31/2012 showed no reversible ischemia, a small mid anterior wall fixed defect/infarct, and ejection fraction 42%.        Past Surgical History  Procedure Laterality Date  . Cystoscopy w/ retrogrades       Current Outpatient Prescriptions  Medication Sig Dispense Refill  . aspirin 81 MG EC tablet Take 1  tablet (81 mg total) by mouth daily. 30 tablet 6  . atorvastatin (LIPITOR) 40 MG tablet Take 1 tablet (40 mg total) by mouth daily. 30 tablet 3  . carvedilol (COREG) 12.5 MG tablet Take 1/2 tablet (6.25mg ) twice a day x 2 weeks then increase to 1 tablet (12.5mg ) twice a day 60 tablet 5  . furosemide (LASIX) 40 MG tablet Take 1 tablet (40 mg total) by mouth daily. 90 tablet 4  . gabapentin (NEURONTIN) 300 MG capsule Take 1 capsule (300 mg total) by mouth 3 (three) times daily. 180 capsule 1  . glucose blood  (TRUETRACK TEST) test strip Use as directed to test blood sugar three times a day before meals. 100 each 1  . Insulin Glargine (LANTUS SOLOSTAR) 100 UNIT/ML Solostar Pen Inject 44 Units into the skin daily. 15 mL 3  . Ipratropium-Albuterol (COMBIVENT) 20-100 MCG/ACT AERS respimat Inhale 1 puff into the lungs every 6 (six) hours as needed for wheezing or shortness of breath. 4 g 6  . Lancets MISC Use to test blood sugar three times a day before meals. 100 each 1  . metFORMIN (GLUCOPHAGE) 1000 MG tablet Take 1 tablet (1,000 mg total) by mouth 2 (two) times daily with a meal. 60 tablet 11  . olmesartan (BENICAR) 20 MG tablet Take 1 tablet (20 mg total) by mouth daily. 90 tablet 1  . sildenafil (VIAGRA) 50 MG tablet Take 1 tablet (50 mg total) by mouth daily as needed for erectile dysfunction (Take 1 hour before sexual activity; do not take more than one dose in a 24 hour period.). 10 tablet 0  . [DISCONTINUED] albuterol (PROVENTIL HFA;VENTOLIN HFA) 108 (90 BASE) MCG/ACT inhaler Inhale 1-2 puffs into the lungs every 6 (six) hours as needed for wheezing or shortness of breath.    . [DISCONTINUED] chlorpheniramine (CHLOR-TRIMETON) 4 MG tablet Take 1 tablet (4 mg total) by mouth 2 (two) times daily as needed for allergies. 30 tablet 0   No current facility-administered medications for this visit.    Allergies:   Lisinopril    Social History:  The patient  reports that he has never smoked. He has never used smokeless tobacco. He reports that he does not drink alcohol or use illicit drugs.   Family History:  The patient's family history includes Breast cancer in his mother; Diabetes in his maternal grandmother; Hypertension in his father. There is no history of Colon cancer, Prostate cancer, or Heart attack.    ROS:  Please see the history of present illness.    Otherwise, review of systems positive for none.   All other systems are reviewed and negative.    PHYSICAL EXAM: VS:  BP 160/110 mmHg   Pulse 102  Ht 6\' 1"  (1.854 m)  Wt 307 lb 8 oz (139.481 kg)  BMI 40.58 kg/m2 , BMI Body mass index is 40.58 kg/(m^2).  General: Alert, oriented x3, no distress Head: no evidence of trauma, PERRL, EOMI, no exophtalmos or lid lag, no myxedema, no xanthelasma; normal ears, nose and oropharynx Neck: normal jugular venous pulsations and no hepatojugular reflux; brisk carotid pulses without delay and no carotid bruits Chest: clear to auscultation, no signs of consolidation by percussion or palpation, normal fremitus, symmetrical and full respiratory excursions Cardiovascular: normal position and quality of the apical impulse, regular rhythm, normal first and second heart sounds, no murmurs, rubs or gallops Abdomen: no tenderness or distention, no masses by palpation, no abnormal pulsatility or arterial bruits, normal bowel sounds, no hepatosplenomegaly Extremities:  no clubbing, cyanosis or edema; 2+ radial, ulnar and brachial pulses bilaterally; 2+ right femoral, posterior tibial and dorsalis pedis pulses; 2+ left femoral, posterior tibial and dorsalis pedis pulses; no subclavian or femoral bruits Neurological: grossly nonfocal Psych: euthymic mood, full affect   EKG:  EKG is ordered today. The ekg ordered today demonstrates  Normal sinus rhythm with nonspecific ST segment changes. Typical changes of LVH are not seen, possibly masked by obesity   Recent Labs: 04/17/2014: Hemoglobin 14.9; Platelets 159 07/18/2014: ALT 24; B Natriuretic Peptide 554.3*; BUN 15; Creat 1.08; Potassium 3.9; Sodium 137    Lipid Panel    Component Value Date/Time   CHOL 152 07/18/2014 1056   TRIG 86 07/18/2014 1056   HDL 30* 07/18/2014 1056   CHOLHDL 5.1 07/18/2014 1056   VLDL 17 07/18/2014 1056   LDLCALC 105* 07/18/2014 1056      Wt Readings from Last 3 Encounters:  11/07/14 307 lb 8 oz (139.481 kg)  09/11/14 315 lb 9.6 oz (143.155 kg)  07/18/14 313 lb 6.4 oz (142.157 kg)     ASSESSMENT AND PLAN:  1.    Probable acute on chronic diastolic heart failure -  I suspect that Mr. Lehenbauer is worsening dyspnea is related to volume overload in the setting of congestive heart failure. I'm not sure if the dominant abnormality is left ventricular diastolic dysfunction secondary to severe LVH versus cor pulmonale related to insufficiently treated obstructive sleep apnea. I do share his primary care provider is concerned that he may have dyspnea has an angina equivalent. He has multiple coronary risk factors , most of which are not yet well controlled (hemoglobin A1c is greater than 8, LDL is above target of 70, blood pressure is severely elevated). I have recommended that he have a [employer]. I don't think he'll be able to exercise sufficiently for a treadmill stress test.  2. 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 Have recommended that he increase the carvedilol to 6.25 mg twice a day.  This is not going to be sufficient for blood pressure control and another 2 weeks he should increase it to 12.5 mg twice a day  3.  Obstructive sleep apnea -  Sounds like they may be some issues with his equipment settings and he has not had appropriate follow-up for treatment of this in a while. I recommended that he see Dr. Ellouise Newer in the sleep clinic   Current medicines are reviewed at length with the patient today.  The patient does not have concerns regarding medicines.   Labs/ tests ordered today include:  Orders Placed This Encounter  Procedures  . Myocardial Perfusion Imaging  . EKG 12-Lead    Patient Instructions  Medication Instructions:   INCREASE CARVEDILOL TO 6.25MG  TWICE A DAY X 2 WEEKS THEN TAKE 12.5MG  TWICE DAILY.  Labwork:  NONE  Testing/Procedures:  Your physician has requested that you have en exercise stress myoview. For further information please visit [email or web address]. Please  follow instruction sheet, as given.  HOLD CARVEDILOL PRIOR TO THIS TEST.  YOU NEED AN APPOINTMENT WITH DR. Montrose.   Follow-Up:  1ST AVAILABLE  Any Other Special Instructions Will Be Listed Below (If Applicable).      Mikael Spray, MD  11/09/2014 6:43 PM    [name], MD, [facility] [phone] office [phone] pager

## 2014-11-07 NOTE — Patient Instructions (Signed)
Medication Instructions:   INCREASE CARVEDILOL TO 6.25MG  TWICE A DAY X 2 WEEKS THEN TAKE 12.5MG  TWICE DAILY.  Labwork:  NONE  Testing/Procedures:  Your physician has requested that you have en exercise stress myoview. For further information please visit [email or web address]. Please follow instruction sheet, as given.  HOLD CARVEDILOL PRIOR TO THIS TEST.  YOU NEED AN APPOINTMENT WITH DR. Litchfield.   Follow-Up:  1ST AVAILABLE  Any Other Special Instructions Will Be Listed Below (If Applicable).

## 2014-11-15 ENCOUNTER — Encounter: Payer: Self-pay | Admitting: Internal Medicine

## 2014-11-26 ENCOUNTER — Ambulatory Visit (HOSPITAL_COMMUNITY): Payer: Self-pay | Attending: Cardiovascular Disease

## 2014-11-27 ENCOUNTER — Ambulatory Visit (HOSPITAL_COMMUNITY)
Admission: RE | Admit: 2014-11-27 | Discharge: 2014-11-27 | Disposition: A | Discharge status: Home / Self Care | Payer: Self-pay | Source: Ambulatory Visit | Attending: Cardiology | Admitting: Cardiology

## 2014-11-27 DIAGNOSIS — I10 Essential (primary) hypertension: Secondary | ICD-10-CM | POA: Insufficient documentation

## 2014-11-27 DIAGNOSIS — G4733 Obstructive sleep apnea (adult) (pediatric): Secondary | ICD-10-CM | POA: Insufficient documentation

## 2014-11-27 DIAGNOSIS — R0602 Shortness of breath: Secondary | ICD-10-CM | POA: Insufficient documentation

## 2014-11-27 DIAGNOSIS — R5383 Other fatigue: Secondary | ICD-10-CM | POA: Insufficient documentation

## 2014-11-27 DIAGNOSIS — E119 Type 2 diabetes mellitus without complications: Secondary | ICD-10-CM | POA: Insufficient documentation

## 2014-11-27 DIAGNOSIS — E669 Obesity, unspecified: Secondary | ICD-10-CM | POA: Insufficient documentation

## 2014-11-27 MED ORDER — REGADENOSON 0.4 MG/5ML IV SOLN
0.4000 mg | Freq: Once | INTRAVENOUS | Status: AC
  Administered 2014-11-27: 0.4 mg via INTRAVENOUS

## 2014-11-27 MED ORDER — TECHNETIUM TC 99M SESTAMIBI GENERIC - CARDIOLITE
30.7000 | Freq: Once | INTRAVENOUS | Status: AC | PRN
  Administered 2014-11-27: 30.7 via INTRAVENOUS

## 2014-11-28 ENCOUNTER — Ambulatory Visit (HOSPITAL_COMMUNITY)
Admission: RE | Admit: 2014-11-28 | Discharge: 2014-11-28 | Disposition: A | Discharge status: Home / Self Care | Payer: Self-pay | Source: Ambulatory Visit | Attending: Cardiology | Admitting: Cardiology

## 2014-11-28 MED ORDER — TECHNETIUM TC 99M SESTAMIBI GENERIC - CARDIOLITE
31.8000 | Freq: Once | INTRAVENOUS | Status: AC | PRN
  Administered 2014-11-28: 31.8 via INTRAVENOUS

## 2014-11-29 LAB — MYOCARDIAL PERFUSION IMAGING
CSEPPHR: 103 {beats}/min
LVDIAVOL: 172 mL
LVSYSVOL: 128 mL
NUC STRESS TID: 0.85
Rest HR: 102 {beats}/min
SDS: 2
SRS: 3
SSS: 5

## 2014-12-05 ENCOUNTER — Ambulatory Visit: Payer: Self-pay | Admitting: Cardiovascular Disease

## 2014-12-06 ENCOUNTER — Telehealth: Payer: Self-pay | Admitting: Cardiovascular Disease

## 2014-12-06 NOTE — Telephone Encounter (Signed)
Closed encounter °

## 2014-12-31 ENCOUNTER — Encounter: Payer: Self-pay | Admitting: Cardiology

## 2014-12-31 ENCOUNTER — Ambulatory Visit (INDEPENDENT_AMBULATORY_CARE_PROVIDER_SITE_OTHER): Payer: Self-pay | Admitting: Cardiology

## 2014-12-31 VITALS — BP 150/96 | HR 95 | Ht 73.0 in | Wt 312.8 lb

## 2014-12-31 DIAGNOSIS — Z79899 Other long term (current) drug therapy: Secondary | ICD-10-CM

## 2014-12-31 MED ORDER — CARVEDILOL 25 MG PO TABS: 25.0000 mg | ORAL_TABLET | Freq: Two times a day (BID) | ORAL | Status: DC

## 2014-12-31 NOTE — Progress Notes (Signed)
Cardiology Office Note   Date:  01/01/2015   ID:  Terry Parks, DOB 1955-12-11, MRN [id number]  PCP:  Charlott Rakes, MD  Cardiologist:  Dr. Sallyanne Kuster    Chief Complaint  Patient presents with  . Cardiomyopathy    2 month visit; complains of swelling in feet and ankles; med changes at last visit; had stress test; patient to see Dr. Claiborne Billings in sleep clinic      History of Present Illness: Terry Parks is a 59 y.o. male who presents for titration of medication for cardiomyopathy.  He has functional class II exertional dyspnea but denies angina pectoris. Dyspnea has worsened over the last few months and occasionally he becomes short of breath at rest.He has chronic lower extremity edema, and has been out of lasix for a few days so more edema.   On our office screening tool for sleep apnea he scored 18 points on the Epworth Sleepiness Scale. He is using a CPAP machine but has not had it tested recently. He believes it is not delivering the right pressure. He has appt with sleep clinic in Oct.  He has not had palpitations or syncope. 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 He is morbidly obese with a BMI just over 40.  In 2013 evaluation showed a left ventricular ejection fraction of 40-45 percent by echocardiography , 42% by nuclear scintigraphy with minimal perfusion abnormalities Francis apical scar versus thinning and no ischemia). His most recent evaluation by echocardiography in May 2016 shows left ventricular ejection fraction normal at 60-65 percent, but there is severe left ventricular hypertrophy and severe left atrial dilatation.  On last visit with Dr. Sallyanne Kuster exercise Brantley Fling was ordered and there was no ischemia and EF 25%.   His current treatment includes a very low dose of carvedilol but a decent dose of angiotensin receptor blocker as well as a loop diuretic, statin and aspirin. He has used sildenafil without side effects in the past. He has diabetes and is  doing a better job with diet and taking his meds, though he is out of accu check strips and lasix.  We discussed importance of taking care of himself if he hopes to have longevity.   Past Medical History  Diagnosis Date  . Diabetes mellitus 04/08/2008  . Hypertension   . Hyperlipemia   . Obstructive sleep apnea 03/06/2008    Sleep study 03/06/08 showed severe OSA/hypopnea syndrome, with successful CPAP titration to 13 CWP using a medium ResMed Mirage Quattro full face mask with heated humidifier.   . Cardiomegaly     Mild by CXR 11/04/04  . BPH (benign prostatic hypertrophy)     Massive BPH noted on cystoscopy 1/23/ 2012 by Dr. Risa Grill.  . Renal calculus 05/29/2010    CT scan of abdomen/pelvis on 05/29/2010 showed an obstructing approximate 1-2 mm calculus at the left UVJ, and an approximate 1-2 mm left lower pole renal calculus.   Patient had continuing severe pain , and an elevation of his serum creatinine to a value of 1.75 on 06/06/2010.  The stone had apparently passed and was not seen on repeat CT 06/08/2010.  [name] Headache(784.0)   . Allergic rhinitis   . Neck pain   . Abscess of groin   . Abscessed tooth   . Hypertensive cardiopathy 03/01/2006    2-D echocardiogram 02/01/2012 showed moderate LVH, mildly to moderately reduced left ventricular systolic function with an estimated ejection fraction of 40-45%, and diffuse hypokinesis.  A  nuclear medicine stress study done 01/31/2012 showed no reversible ischemia, a small mid anterior wall fixed defect/infarct, and ejection fraction 42%.        Past Surgical History  Procedure Laterality Date  . Cystoscopy w/ retrogrades       Current Outpatient Prescriptions  Medication Sig Dispense Refill  . aspirin 81 MG EC tablet Take 1 tablet (81 mg total) by mouth daily. 30 tablet 6  . atorvastatin (LIPITOR) 40 MG tablet Take 1 tablet (40 mg total) by mouth daily. 30 tablet 3  . carvedilol (COREG) 25 MG tablet Take 1 tablet (25 mg total) by mouth 2  (two) times daily. 60 tablet 6  . furosemide (LASIX) 40 MG tablet Take 1 tablet (40 mg total) by mouth daily. 90 tablet 4  . gabapentin (NEURONTIN) 300 MG capsule Take 1 capsule (300 mg total) by mouth 3 (three) times daily. 180 capsule 1  . glucose blood (TRUETRACK TEST) test strip Use as directed to test blood sugar three times a day before meals. 100 each 1  . Insulin Glargine (LANTUS SOLOSTAR) 100 UNIT/ML Solostar Pen Inject 44 Units into the skin daily. 15 mL 3  . Ipratropium-Albuterol (COMBIVENT) 20-100 MCG/ACT AERS respimat Inhale 1 puff into the lungs every 6 (six) hours as needed for wheezing or shortness of breath. 4 g 6  . Lancets MISC Use to test blood sugar three times a day before meals. 100 each 1  . metFORMIN (GLUCOPHAGE) 1000 MG tablet Take 1 tablet (1,000 mg total) by mouth 2 (two) times daily with a meal. 60 tablet 11  . olmesartan (BENICAR) 20 MG tablet Take 1 tablet (20 mg total) by mouth daily. 90 tablet 1  . sildenafil (VIAGRA) 50 MG tablet Take 1 tablet (50 mg total) by mouth daily as needed for erectile dysfunction (Take 1 hour before sexual activity; do not take more than one dose in a 24 hour period.). 10 tablet 0  . [DISCONTINUED] albuterol (PROVENTIL HFA;VENTOLIN HFA) 108 (90 BASE) MCG/ACT inhaler Inhale 1-2 puffs into the lungs every 6 (six) hours as needed for wheezing or shortness of breath.    . [DISCONTINUED] chlorpheniramine (CHLOR-TRIMETON) 4 MG tablet Take 1 tablet (4 mg total) by mouth 2 (two) times daily as needed for allergies. 30 tablet 0   No current facility-administered medications for this visit.    Allergies:   Lisinopril    Social History:  The patient  reports that he has never smoked. He has never used smokeless tobacco. He reports that he does not drink alcohol or use illicit drugs.   Family History:  The patient's family history includes Breast cancer in his mother; Diabetes in his maternal grandmother; Hypertension in his father. There is no  history of Colon cancer, Prostate cancer, or Heart attack.    ROS:  General:no colds or fevers, + weight increase- out of lasix Skin:no rashes or ulcers HEENT:no blurred vision, no congestion CV:see HPI PUL:see HPI GI:no diarrhea constipation or melena, no indigestion GU:no hematuria, no dysuria MS:no joint pain, no claudication Neuro:no syncope, no lightheadedness Endo:+ diabetes, no thyroid disease  Wt Readings from Last 3 Encounters:  12/31/14 312 lb 12.8 oz (141.885 kg)  11/27/14 307 lb (139.254 kg)  11/07/14 307 lb 8 oz (139.481 kg)     PHYSICAL EXAM: VS:  BP 150/96 mmHg  Pulse 95  Ht 6\' 1"  (1.854 m)  Wt 312 lb 12.8 oz (141.885 kg)  BMI 41.28 kg/m2 , BMI Body mass index is 41.28 kg/(m^2).  General:Pleasant affect, NAD Skin:Warm and dry, brisk capillary refill HEENT:normocephalic, sclera clear, mucus membranes moist Neck:supple, no JVD, no bruits  Heart:S1S2 RRR without murmur, gallup, rub or click Lungs:clear without rales, rhonchi, or wheezes WYO:VZCH, non tender, + BS, do not palpate liver spleen or masses Ext:1+ lower ext edema, 2+ pedal pulses, 2+ radial pulses Neuro:alert and oriented X 3, MAE, follows commands, + facial symmetry    EKG:  EKG is ordered today. The ekg ordered today demonstrates  SR rate 95, LAD, no acute changes.QtC is shorter than on last EKG   Recent Labs: 04/17/2014: Hemoglobin 14.9; Platelets 159 07/18/2014: ALT 24; B Natriuretic Peptide 554.3*; BUN 15; Creat 1.08; Potassium 3.9; Sodium 137    Lipid Panel    Component Value Date/Time   CHOL 152 07/18/2014 1056   TRIG 86 07/18/2014 1056   HDL 30* 07/18/2014 1056   CHOLHDL 5.1 07/18/2014 1056   VLDL 17 07/18/2014 1056   LDLCALC 105* 07/18/2014 1056       Other studies Reviewed:  nuc: Additional studies/ records that were reviewed today include:   The left ventricular ejection fraction is severely decreased (<30%).  Nuclear stress EF: 25%.  Diaphragmatic attenuation with  mild LV dilatation and severeal global HK C/W NISCM      Previous notes   ASSESSMENT AND PLAN:  1. improved acute on chronic diastolic heart failure - pt with  worsening dyspnea is related to volume overload in the setting of congestive heart failure. He has possible combination abnormality of left ventricular diastolic dysfunction secondary to severe LVH versus cor pulmonale related to insufficiently treated obstructive sleep apnea. nuc study without ischemia- more non ischemic cardiomyopathy.  Will titrate up his coreg to 25 BID and he will resume lasix.  I will see him back in 2 weeks to increase his ACE.  He will need follow up echo in 3 months once meds are titrated.    Discussed importance of meds and controlling diabetes.  2. 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 Tolerating coreg at 12.5 bid but will increase to 25 BID.  3. Obstructive sleep apnea - Sounds like they may be some issues with his equipment settings and he has not had appropriate follow-up for treatment of this in a while. To see Dr. Ellouise Newer in the sleep clinic.   Current medicines are reviewed with the patient today.  The patient Has no concerns regarding medicines.  The following changes have been made:  See above Labs/ tests ordered today include:see above  Disposition:   FU:  see above  Lennie Muckle, NP  01/01/2015 7:29 PM    [facility] [place], [place], [place], [place] Phone: [phone]; Fax: [phone]

## 2014-12-31 NOTE — Patient Instructions (Addendum)
Medication Instructions:   INCREASE carvedilol to 25mg  twice daily  Lab Work:  BMET next week   Follow-Up:  2 weeks with extender (PA or NP)  Any Other Special Instructions Will Be Listed Below (If Applicable).  Please watch your salt intake.  Please make sure you take lasix as directed

## 2015-01-01 ENCOUNTER — Encounter: Payer: Self-pay | Admitting: Cardiology

## 2015-01-13 NOTE — Progress Notes (Signed)
Cardiology Office Note   Date:  01/14/2015   ID:  Terry Parks, DOB 1955-06-16, MRN [id number]  PCP:  Charlott Rakes, MD  Cardiologist:  Dr. Sallyanne Kuster   Chief Complaint  Patient presents with  . Shortness of Breath    2 week visit - complains of swelling and tightness in feet      History of Present Illness: Terry Parks is a 59 y.o. male who presents for CHF, HTN, hyperlipidemia and results of Echo and stress test.    He was seen by Dr. Jerilynn Mages. [name] 11/07/14 for class II exertional dyspnea, no angina.  Echo in 2013 showed a left ventricular ejection fraction of 40-45 percent by echocardiography , 42% by nuclear scintigraphy with minimal perfusion abnormalities --most recent evaluation by echocardiography in May 2016 shows left ventricular ejection fraction normal at 60-65 percent, but there is severe left ventricular hypertrophy and severe left atrial dilatation.  Concern for decrease in EF and possibility of this due to CAD.  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 There is a discrepancy from nuc study and echo.  His meds have been adjusted with increase in coreg.   Today no chest pain or SOB.  Mild edema of lower ext.  Has not been able to check glucose no money for lancets.  He will follow up at PCP.  BP is elevated and he has not had meds this AM.           Past Medical History  Diagnosis Date  . Diabetes mellitus 04/08/2008  . Hypertension   . Hyperlipemia   . Obstructive sleep apnea 03/06/2008    Sleep study 03/06/08 showed severe OSA/hypopnea syndrome, with successful CPAP titration to 13 CWP using a medium ResMed Mirage Quattro full face mask with heated humidifier.   . Cardiomegaly     Mild by CXR 11/04/04  . BPH (benign prostatic hypertrophy)     Massive BPH noted on cystoscopy 1/23/ 2012 by Dr. Risa Grill.  . Renal calculus 05/29/2010    CT scan of abdomen/pelvis on 05/29/2010 showed an obstructing approximate 1-2 mm  calculus at the left UVJ, and an approximate 1-2 mm left lower pole renal calculus.   Patient had continuing severe pain , and an elevation of his serum creatinine to a value of 1.75 on 06/06/2010.  The stone had apparently passed and was not seen on repeat CT 06/08/2010.  [name] Headache(784.0)   . Allergic rhinitis   . Neck pain   . Abscess of groin   . Abscessed tooth   . Hypertensive cardiopathy 03/01/2006    2-D echocardiogram 02/01/2012 showed moderate LVH, mildly to moderately reduced left ventricular systolic function with an estimated ejection fraction of 40-45%, and diffuse hypokinesis.  A nuclear medicine stress study done 01/31/2012 showed no reversible ischemia, a small mid anterior wall fixed defect/infarct, and ejection fraction 42%.        Past Surgical History  Procedure Laterality Date  . Cystoscopy w/ retrogrades       Current Outpatient Prescriptions  Medication Sig Dispense Refill  . aspirin 81 MG EC tablet Take 1 tablet (81 mg total) by mouth daily. 30 tablet 6  . atorvastatin (LIPITOR) 40 MG tablet Take 1 tablet (40 mg total) by mouth daily. 30 tablet 3  . carvedilol (COREG) 25 MG tablet Take 1 tablet (25 mg total) by mouth 2 (two) times daily. 60 tablet 6  . furosemide (LASIX) 40 MG tablet Take 1 tablet (40  mg total) by mouth daily. 90 tablet 4  . gabapentin (NEURONTIN) 300 MG capsule Take 1 capsule (300 mg total) by mouth 3 (three) times daily. 180 capsule 1  . glucose blood (TRUETRACK TEST) test strip Use as directed to test blood sugar three times a day before meals. 100 each 1  . Insulin Glargine (LANTUS SOLOSTAR) 100 UNIT/ML Solostar Pen Inject 44 Units into the skin daily. 15 mL 3  . Ipratropium-Albuterol (COMBIVENT) 20-100 MCG/ACT AERS respimat Inhale 1 puff into the lungs every 6 (six) hours as needed for wheezing or shortness of breath. 4 g 6  . Lancets MISC Use to test blood sugar three times a day before meals. 100 each 1  . metFORMIN (GLUCOPHAGE) 1000 MG tablet  Take 1 tablet (1,000 mg total) by mouth 2 (two) times daily with a meal. 60 tablet 11  . olmesartan (BENICAR) 20 MG tablet Take 1 tablet (20 mg total) by mouth daily. 90 tablet 1  . sildenafil (VIAGRA) 50 MG tablet Take 1 tablet (50 mg total) by mouth daily as needed for erectile dysfunction (Take 1 hour before sexual activity; do not take more than one dose in a 24 hour period.). 10 tablet 0  . [DISCONTINUED] albuterol (PROVENTIL HFA;VENTOLIN HFA) 108 (90 BASE) MCG/ACT inhaler Inhale 1-2 puffs into the lungs every 6 (six) hours as needed for wheezing or shortness of breath.    . [DISCONTINUED] chlorpheniramine (CHLOR-TRIMETON) 4 MG tablet Take 1 tablet (4 mg total) by mouth 2 (two) times daily as needed for allergies. 30 tablet 0   No current facility-administered medications for this visit.    Allergies:   Lisinopril    Social History:  The patient  reports that he has never smoked. He has never used smokeless tobacco. He reports that he does not drink alcohol or use illicit drugs.   Family History:  The patient's family history includes Breast cancer in his mother; Diabetes in his maternal grandmother; Hypertension in his father. There is no history of Colon cancer, Prostate cancer, or Heart attack.    ROS:  General:no colds or fevers, + weight down 4 lbs. Skin:no rashes or ulcers HEENT:no blurred vision, no congestion CV:see HPI PUL:see HPI GI:no diarrhea constipation or melena, no indigestion GU:no hematuria, no dysuria MS:no joint pain, no claudication Neuro:no syncope, no lightheadedness Endo:+ diabetes, has been unable to afford lancets to check glucose,no thyroid disease  Wt Readings from Last 3 Encounters:  01/14/15 308 lb 6.4 oz (139.889 kg)  12/31/14 312 lb 12.8 oz (141.885 kg)  11/27/14 307 lb (139.254 kg)     PHYSICAL EXAM: VS:  BP 160/110 mmHg  Pulse 96  Ht 6\' 1"  (1.854 m)  Wt 308 lb 6.4 oz (139.889 kg)  BMI 40.70 kg/m2 , BMI Body mass index is 40.7  kg/(m^2). General:Pleasant affect, NAD Skin:Warm and dry, brisk capillary refill HEENT:normocephalic, sclera clear, mucus membranes moist Neck:supple, no JVD, no bruits  Heart:S1S2 RRR without murmur, gallup, rub or click Lungs:clear without rales, rhonchi, or wheezes DGL:OVFI, non tender, + BS, do not palpate liver spleen or masses Ext:no lower ext edema, 2+ pedal pulses, 2+ radial pulses Neuro:alert and oriented, MAE, follows commands, + facial symmetry HE DID NOT TAKE MEDS THIS AM   EKG:  EKG is NOT ordered today.    Recent Labs: 04/17/2014: Hemoglobin 14.9; Platelets 159 07/18/2014: ALT 24; B Natriuretic Peptide 554.3*; BUN 15; Creat 1.08; Potassium 3.9; Sodium 137    Lipid Panel    Component Value Date/Time  CHOL 152 07/18/2014 1056   TRIG 86 07/18/2014 1056   HDL 30* 07/18/2014 1056   CHOLHDL 5.1 07/18/2014 1056   VLDL 17 07/18/2014 1056   LDLCALC 105* 07/18/2014 1056       Other studies Reviewed: Additional studies/ records that were reviewed today include: previous notes.   ASSESSMENT AND PLAN:  1. improved acute on chronic diastolic heart failure - pt had worsening dyspnea is related to volume overload in the setting of congestive heart failure. He was felt to have possible combination abnormality of left ventricular diastolic dysfunction secondary to severe LVH versus cor pulmonale related to insufficiently treated obstructive sleep apnea. nuc study without ischemia- more non ischemic cardiomyopathy. We increased his coreg to 25 BID and he resumed his lasix. Today we increased his benicar to 40 mg daily. He will need follow up echo in 3 months once meds are titrated. Discussed importance of meds and controlling diabetes. He will follow up in 3 weeks with APP to further eval BP, he will have lab through PCP at [facility] outpt. He will keep appt with Dr. Corky Downs and Dr. Jerilynn Mages. [name].    2. 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 Tolerating coreg 25 mg BID. Increased benicar, if BP remains elevated he may need hydralazine.   3. Obstructive sleep apnea - Sounds like they may be some issues with his equipment settings and he has not had appropriate follow-up for treatment of this in a while. To see Dr. Ellouise Newer in the sleep clinic.  [facility] he has had his mask for over a year and they could not help they do not accept orange card.   Current medicines are reviewed with the patient today.  The patient Has no concerns regarding medicines.  The following changes have been made:  See above Labs/ tests ordered today include:see above  Disposition:   FU:  see above  Signed, Isaiah Serge, NP  01/14/2015 8:32 AM    [facility] [place], [place], [place], [place] Phone: [phone]; Fax: [phone]

## 2015-01-14 ENCOUNTER — Ambulatory Visit (INDEPENDENT_AMBULATORY_CARE_PROVIDER_SITE_OTHER): Payer: No Typology Code available for payment source | Admitting: Cardiology

## 2015-01-14 ENCOUNTER — Encounter: Payer: Self-pay | Admitting: Cardiology

## 2015-01-14 VITALS — BP 160/110 | HR 96 | Ht 73.0 in | Wt 308.4 lb

## 2015-01-14 DIAGNOSIS — I110 Hypertensive heart disease with heart failure: Secondary | ICD-10-CM

## 2015-01-14 DIAGNOSIS — Z79899 Other long term (current) drug therapy: Secondary | ICD-10-CM

## 2015-01-14 DIAGNOSIS — I5042 Chronic combined systolic (congestive) and diastolic (congestive) heart failure: Secondary | ICD-10-CM

## 2015-01-14 DIAGNOSIS — I509 Heart failure, unspecified: Secondary | ICD-10-CM

## 2015-01-14 MED ORDER — OLMESARTAN MEDOXOMIL 40 MG PO TABS: 40.0000 mg | ORAL_TABLET | Freq: Every day | ORAL | Status: DC

## 2015-01-14 NOTE — Patient Instructions (Signed)
Your physician has recommended you make the following change in your medication.. INCREASE benicar to 40mg  once daily  >> you can take 2 of your 20mg  tablets >> we have provided a new prescription for the 40mg  tablets  Your physician recommends that you return for lab work   Your physician recommends that you schedule a follow-up appointment in: 3 weeks with NP or PA

## 2015-02-11 ENCOUNTER — Ambulatory Visit: Payer: Self-pay | Admitting: Cardiovascular Disease

## 2015-02-17 ENCOUNTER — Ambulatory Visit: Payer: Self-pay | Admitting: Internal Medicine

## 2015-02-18 ENCOUNTER — Ambulatory Visit (INDEPENDENT_AMBULATORY_CARE_PROVIDER_SITE_OTHER): Payer: No Typology Code available for payment source | Admitting: Internal Medicine

## 2015-02-18 VITALS — BP 163/99 | HR 91 | Temp 98.3°F | Ht 73.0 in | Wt 310.0 lb

## 2015-02-18 DIAGNOSIS — E1140 Type 2 diabetes mellitus with diabetic neuropathy, unspecified: Secondary | ICD-10-CM

## 2015-02-18 DIAGNOSIS — I10 Essential (primary) hypertension: Secondary | ICD-10-CM

## 2015-02-18 DIAGNOSIS — Z794 Long term (current) use of insulin: Secondary | ICD-10-CM

## 2015-02-18 DIAGNOSIS — Z79899 Other long term (current) drug therapy: Secondary | ICD-10-CM

## 2015-02-18 DIAGNOSIS — E1142 Type 2 diabetes mellitus with diabetic polyneuropathy: Secondary | ICD-10-CM

## 2015-02-18 MED ORDER — GABAPENTIN 300 MG PO CAPS: 600.0000 mg | ORAL_CAPSULE | Freq: Three times a day (TID) | ORAL | Status: DC

## 2015-02-18 MED ORDER — AMLODIPINE BESYLATE 5 MG PO TABS: 5.0000 mg | ORAL_TABLET | Freq: Every day | ORAL | Status: DC

## 2015-02-18 NOTE — Assessment & Plan Note (Addendum)
BP Readings from Last 3 Encounters:  02/18/15 163/99  01/14/15 160/110  12/31/14 150/96    Lab Results  Component Value Date   NA 137 07/18/2014   K 3.9 07/18/2014   CREATININE 1.08 07/18/2014    Assessment: Blood pressure control:  Uncontrolled Comments: Says Terry Parks is compliant with Coreg- now on 25mg  BID, also on Lasix- 40mg  BID or daily- ? Frequency , Olmesatan 40mg  daily.   Plan: Medications:  Cont present meds, and add Norvasc 5mg  daily Other plans: - bring BP log next visit - See in 4-6 weeks. - Bmet today

## 2015-02-18 NOTE — Progress Notes (Signed)
Patient ID: Terry Parks, male   DOB: [birth year], 57 y.o.   MRN: [id number]   Subjective:   Patient ID: Terry Parks male   DOB: 1955/08/08 59 y.o.   MRN: [id number]  HPI: [name] is a 59 y.o. with PMH listed below. Presented today with c/o numbness and pain, started 4 month ago. He says his feet gets tight, no puffiness or swelling, says he has had swelling before , but this is different. He feels its like electricity, sometimes it feels like electricity, it extends from  Dorsum to upper calves. He says sometimes he puts his feet on something warm and cannot feel it. He sometimes feels numbness in the tips of his fingers.   Past Medical History  Diagnosis Date  . Diabetes mellitus 04/08/2008  . Hypertension   . Hyperlipemia   . Obstructive sleep apnea 03/06/2008    Sleep study 03/06/08 showed severe OSA/hypopnea syndrome, with successful CPAP titration to 13 CWP using a medium ResMed Mirage Quattro full face mask with heated humidifier.   . Cardiomegaly     Mild by CXR 11/04/04  . BPH (benign prostatic hypertrophy)     Massive BPH noted on cystoscopy 1/23/ 2012 by Dr. Risa Grill.  . Renal calculus 05/29/2010    CT scan of abdomen/pelvis on 05/29/2010 showed an obstructing approximate 1-2 mm calculus at the left UVJ, and an approximate 1-2 mm left lower pole renal calculus.   Patient had continuing severe pain , and an elevation of his serum creatinine to a value of 1.75 on 06/06/2010.  The stone had apparently passed and was not seen on repeat CT 06/08/2010.  [name] Headache(784.0)   . Allergic rhinitis   . Neck pain   . Abscess of groin   . Abscessed tooth   . Hypertensive cardiopathy 03/01/2006    2-D echocardiogram 02/01/2012 showed moderate LVH, mildly to moderately reduced left ventricular systolic function with an estimated ejection fraction of 40-45%, and diffuse hypokinesis.  A nuclear medicine stress study done 01/31/2012 showed no reversible ischemia, a small mid anterior  wall fixed defect/infarct, and ejection fraction 42%.       Current Outpatient Prescriptions  Medication Sig Dispense Refill  . aspirin 81 MG EC tablet Take 1 tablet (81 mg total) by mouth daily. 30 tablet 6  . atorvastatin (LIPITOR) 40 MG tablet Take 1 tablet (40 mg total) by mouth daily. 30 tablet 3  . carvedilol (COREG) 25 MG tablet Take 1 tablet (25 mg total) by mouth 2 (two) times daily. 60 tablet 6  . furosemide (LASIX) 40 MG tablet Take 1 tablet (40 mg total) by mouth daily. 90 tablet 4  . gabapentin (NEURONTIN) 300 MG capsule Take 1 capsule (300 mg total) by mouth 3 (three) times daily. 180 capsule 1  . glucose blood (TRUETRACK TEST) test strip Use as directed to test blood sugar three times a day before meals. 100 each 1  . Insulin Glargine (LANTUS SOLOSTAR) 100 UNIT/ML Solostar Pen Inject 44 Units into the skin daily. 15 mL 3  . Ipratropium-Albuterol (COMBIVENT) 20-100 MCG/ACT AERS respimat Inhale 1 puff into the lungs every 6 (six) hours as needed for wheezing or shortness of breath. 4 g 6  . Lancets MISC Use to test blood sugar three times a day before meals. 100 each 1  . metFORMIN (GLUCOPHAGE) 1000 MG tablet Take 1 tablet (1,000 mg total) by mouth 2 (two) times daily with a meal. 60 tablet 11  . olmesartan (BENICAR) 40 MG tablet Take  1 tablet (40 mg total) by mouth daily. 90 tablet 3  . sildenafil (VIAGRA) 50 MG tablet Take 1 tablet (50 mg total) by mouth daily as needed for erectile dysfunction (Take 1 hour before sexual activity; do not take more than one dose in a 24 hour period.). 10 tablet 0  . [DISCONTINUED] albuterol (PROVENTIL HFA;VENTOLIN HFA) 108 (90 BASE) MCG/ACT inhaler Inhale 1-2 puffs into the lungs every 6 (six) hours as needed for wheezing or shortness of breath.    . [DISCONTINUED] chlorpheniramine (CHLOR-TRIMETON) 4 MG tablet Take 1 tablet (4 mg total) by mouth 2 (two) times daily as needed for allergies. 30 tablet 0   No current facility-administered medications  for this visit.   Family History  Problem Relation Age of Onset  . Breast cancer Mother   . Colon cancer Neg Hx   . Prostate cancer Neg Hx   . Heart attack Neg Hx   . Hypertension Father   . Diabetes Maternal Grandmother    Social History   Social History  . Marital Status: Single    Spouse Name: N/A  . Number of Children: 4 b  . Years of Education: 16   Occupational History  .        Social History Main Topics  . Smoking status: Never Smoker   . Smokeless tobacco: Never Used  . Alcohol Use: No  . Drug Use: No  . Sexual Activity: Not Currently   Other Topics Concern  . Not on file   Social History Narrative   Divorced, 4 children, lives alone.  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   Review of Systems: CONSTITUTIONAL- No Fever, weightloss, change in appetite. SKIN- No Rash, colour changes or itching. HEAD- No Headache or dizziness. Mouth/throat- No Sorethroat, dentures, or bleeding gums. RESPIRATORY- No Cough or SOB. CARDIAC- No Palpitations, or chest pain. GI- No  vomiting, diarrhoea,  abd pain. NEUROLOGIC- Has num,bness and burning. [facility]- Denies depression or anxiety.  Objective:  Physical Exam: Filed Vitals:   02/18/15 1538  BP: 163/99  Pulse: 91  Temp: 98.3 F (36.8 C)  TempSrc: Oral  Height: 6\' 1"  (1.854 m)  Weight: 310 lb (140.615 kg)  SpO2: 97%   GENERAL- alert, co-operative, appears as stated age, not in any distress. HEENT- Atraumatic, normocephalic, PERRL,  RESP- Moving equal volumes of air, and clear to auscultation bilaterally, no wheezes or crackles. ABDOMEN- Soft, nontender,  bowel sounds present. BACK- Normal curvature of the spine, No tenderness along the vertebrae, no CVA tenderness. NEURO- No obvious Cr N abnormality, strenght upper and lower extremities- 5/5, Gait- Normal. EXTREMITIES- pulse 2+, symmetric, trace pitting pedal edema. Foot exam- fine touch impaired bilat on some parts of pts feet, position sense intact, vibration sense  appears slightly impaired, worse on the left,- with use of tunning fork. SKIN- Warm, dry, No rash or lesion. PSYCH- Normal mood and affect, appropriate thought content and speech.  Assessment & Plan:   The patient's case and plan of care was discussed with attending physician, Dr. Dareen Piano.  Please see problem based charting for assessment and plan.

## 2015-02-18 NOTE — Patient Instructions (Signed)
We will be increasing your dose of neurontin of gabapentin. Take 2 tablets in the morning, 2 in the afternoon and 2 at night. If you feel sleepy, then reduce the dose to one tablet in the morning and or afternoon.  We will also be starting you on a new blood pressure medication, as your blood pressure is still high. Take on tablet of Amlodipine/Norvasc- once a day.

## 2015-02-18 NOTE — Assessment & Plan Note (Addendum)
Pt with burning pain, numbness, and reduced sensation on bilat lower extremity, also with some numbness in finger tips, lower extremity symtoms are present from his dorsum to calves. Features most consistent with diabetic neuropathy, in stocking distribution. Pt has been on gabapentin 300mg  TID, without relief. He has also been gaining weight, he has no obvious signs of fluid overload but he does endorse taking lots of sweets and cakes, which he quit before.  Plan- for now Trial of higher dose of Gabapentin at 600mg  TID, if sedation he can reduce afternoon or morning dose. Consider lyrica if this fails. - Encouraged better diabetes control - he will be back in about 4 weeks, with glucometer to address his diabetes, as he says he didn't plan to address this today.

## 2015-02-19 ENCOUNTER — Ambulatory Visit (INDEPENDENT_AMBULATORY_CARE_PROVIDER_SITE_OTHER): Payer: No Typology Code available for payment source | Admitting: Cardiovascular Disease

## 2015-02-19 VITALS — BP 148/92 | HR 89 | Ht 73.0 in | Wt 310.2 lb

## 2015-02-19 DIAGNOSIS — G4733 Obstructive sleep apnea (adult) (pediatric): Secondary | ICD-10-CM

## 2015-02-19 DIAGNOSIS — I110 Hypertensive heart disease with heart failure: Secondary | ICD-10-CM

## 2015-02-19 DIAGNOSIS — I10 Essential (primary) hypertension: Secondary | ICD-10-CM

## 2015-02-19 LAB — BMP8+ANION GAP
Anion Gap: 19 mmol/L — ABNORMAL HIGH (ref 10.0–18.0)
BUN/Creatinine Ratio: 7 — ABNORMAL LOW (ref 9–20)
BUN: 8 mg/dL (ref 6–24)
CO2: 24 mmol/L (ref 18–29)
CREATININE: 1.07 mg/dL (ref 0.76–1.27)
Calcium: 8.9 mg/dL (ref 8.7–10.2)
Chloride: 99 mmol/L (ref 97–108)
GFR calc non Af Amer: 77 mL/min/{1.73_m2} (ref >59)
GFR, EST AFRICAN AMERICAN: 89 mL/min/{1.73_m2} (ref >59)
Glucose: 158 mg/dL — ABNORMAL HIGH (ref 65–99)
POTASSIUM: 4.1 mmol/L (ref 3.5–5.2)
SODIUM: 142 mmol/L (ref 134–144)

## 2015-02-19 NOTE — Patient Instructions (Signed)
Your physician recommends that you schedule a follow-up appointment pending sleep evaluation.

## 2015-02-23 ENCOUNTER — Encounter: Payer: Self-pay | Admitting: Cardiovascular Disease

## 2015-02-23 NOTE — Progress Notes (Signed)
Patient ID: Terry Parks, male   DOB: [birth year], 59 y.o.   MRN: [id number]     HPI: Terry Parks is a 59 y.o. male with a history of severe obstructive sleep apnea who is referred by Dr. Sallyanne Kuster or a sleep evaluation.  Terry Parks has a history of hypertension, hyperlipidemia, morbid obesity and  is felt to have functional class II exertional dyspnea.  In 2009 he was diagnosed with severe obstructive sleep apnea and had an AHI of 108.5 per hour.  His REM AHI was 105.  He was titrated up to a CPAP pressure of 13 cm water pressure and utilized a [employer] full face mask.  He is unemployed and has an orange card.  He has not used CPAP for years and previously had used [employer] as a DME company who in the past had accepted his orange card from [employer].  He saw Dr. Sallyanne Kuster in June and at that time, it became apparent that he had not been utilizing CPAP therapy.  The patient tried to resume using his CPAP.  However, Terry Parks would no longer except his orange card; and he has his supplies which are 57 years old.  He has been trying to use his mask which does not fit and he falls asleep holding the mask to his face.  Despite this suboptimal therapy, he has noted some improvement in how he feels with reference to fatigability, hypersomnolence.  Previously, his Epworth Sleepiness Scale score had been 18.  He did not quantify the Epworth Sleepiness scale score today, but checked that he still had a significant chance of dozing sitting and reading, sitting inactive in a public place, sitting as a passenger in a car for an hour without a break, sitting and talking with someone, sitting quietly after lunch, and in the car stopped for a few minutes in traffic.  He does snore.  He cannot sleep on his back.  He admits to fatigability.  He denies restless legs.  He denies hypogognic hallucinations.  He denies cataplectic events.  He presents for sleep evaluation.   Past Medical History    Diagnosis Date  . Diabetes mellitus 04/08/2008  . Hypertension   . Hyperlipemia   . Obstructive sleep apnea 03/06/2008    Sleep study 03/06/08 showed severe OSA/hypopnea syndrome, with successful CPAP titration to 13 CWP using a medium ResMed Mirage Quattro full face mask with heated humidifier.   . Cardiomegaly     Mild by CXR 11/04/04  . BPH (benign prostatic hypertrophy)     Massive BPH noted on cystoscopy 1/23/ 2012 by Dr. Risa Grill.  . Renal calculus 05/29/2010    CT scan of abdomen/pelvis on 05/29/2010 showed an obstructing approximate 1-2 mm calculus at the left UVJ, and an approximate 1-2 mm left lower pole renal calculus.   Patient had continuing severe pain , and an elevation of his serum creatinine to a value of 1.75 on 06/06/2010.  The stone had apparently passed and was not seen on repeat CT 06/08/2010.  [name] Headache(784.0)   . Allergic rhinitis   . Neck pain   . Abscess of groin   . Abscessed tooth   . Hypertensive cardiopathy 03/01/2006    2-D echocardiogram 02/01/2012 showed moderate LVH, mildly to moderately reduced left ventricular systolic function with an estimated ejection fraction of 40-45%, and diffuse hypokinesis.  A nuclear medicine stress study done 01/31/2012 showed no reversible ischemia, a small mid anterior wall fixed defect/infarct, and ejection fraction 42%.  Past Surgical History  Procedure Laterality Date  . Cystoscopy w/ retrogrades      Allergies  Allergen Reactions  . Lisinopril Cough    Current Outpatient Prescriptions  Medication Sig Dispense Refill  . amLODipine (NORVASC) 5 MG tablet Take 1 tablet (5 mg total) by mouth daily. 30 tablet 1  . aspirin 81 MG EC tablet Take 1 tablet (81 mg total) by mouth daily. 30 tablet 6  . atorvastatin (LIPITOR) 40 MG tablet Take 1 tablet (40 mg total) by mouth daily. 30 tablet 3  . carvedilol (COREG) 25 MG tablet Take 1 tablet (25 mg total) by mouth 2 (two) times daily. 60 tablet 6  . furosemide (LASIX) 40 MG  tablet Take 1 tablet (40 mg total) by mouth daily. 90 tablet 4  . gabapentin (NEURONTIN) 300 MG capsule Take 2 capsules (600 mg total) by mouth 3 (three) times daily. 270 capsule 1  . glucose blood (TRUETRACK TEST) test strip Use as directed to test blood sugar three times a day before meals. 100 each 1  . Insulin Glargine (LANTUS SOLOSTAR) 100 UNIT/ML Solostar Pen Inject 44 Units into the skin daily. 15 mL 3  . Ipratropium-Albuterol (COMBIVENT) 20-100 MCG/ACT AERS respimat Inhale 1 puff into the lungs every 6 (six) hours as needed for wheezing or shortness of breath. 4 g 6  . Lancets MISC Use to test blood sugar three times a day before meals. 100 each 1  . metFORMIN (GLUCOPHAGE) 1000 MG tablet Take 1 tablet (1,000 mg total) by mouth 2 (two) times daily with a meal. 60 tablet 11  . olmesartan (BENICAR) 40 MG tablet Take 1 tablet (40 mg total) by mouth daily. 90 tablet 3  . sildenafil (VIAGRA) 50 MG tablet Take 1 tablet (50 mg total) by mouth daily as needed for erectile dysfunction (Take 1 hour before sexual activity; do not take more than one dose in a 24 hour period.). 10 tablet 0  . [DISCONTINUED] albuterol (PROVENTIL HFA;VENTOLIN HFA) 108 (90 BASE) MCG/ACT inhaler Inhale 1-2 puffs into the lungs every 6 (six) hours as needed for wheezing or shortness of breath.    . [DISCONTINUED] chlorpheniramine (CHLOR-TRIMETON) 4 MG tablet Take 1 tablet (4 mg total) by mouth 2 (two) times daily as needed for allergies. 30 tablet 0   No current facility-administered medications for this visit.    Social History   Social History  . Marital Status: Single    Spouse Name: N/A  . Number of Children: 4 b  . Years of Education: 16   Occupational History  .        Social History Main Topics  . Smoking status: Never Smoker   . Smokeless tobacco: Never Used  . Alcohol Use: No  . Drug Use: No  . Sexual Activity: Not Currently   Other Topics Concern  . Not on file   Social History Narrative    Divorced, 4 children, lives alone.  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    Family History  Problem Relation Age of Onset  . Breast cancer Mother   . Colon cancer Neg Hx   . Prostate cancer Neg Hx   . Heart attack Neg Hx   . Hypertension Father   . Diabetes Maternal Grandmother      ROS General: Negative; No fevers, chills, or night sweats; positive for morbid obesity HEENT: Negative; No changes in vision or hearing, sinus congestion, difficulty swallowing Pulmonary: Negative; No cough, wheezing, shortness of breath, hemoptysis Cardiovascular: Negative; No chest  pain, presyncope, syncope, palpatations GI: Negative; No nausea, vomiting, diarrhea, or abdominal pain GU: Negative; No dysuria, hematuria, or difficulty voiding Musculoskeletal: Negative; no myalgias, joint pain, or weakness Hematologic: Negative; no easy bruising, bleeding Endocrine: Negative; no heat/cold intolerance Neuro: Negative; no changes in balance, headaches Skin: Negative; No rashes or skin lesions Psychiatric: Negative; No behavioral problems, depression Sleep: see HPI   Physical Exam BP 148/92 mmHg  Pulse 89  Ht _0  (1.854 m)  Wt 310 lb 3.2 oz (140.706 kg)  BMI 40.93 kg/m2  Wt Readings from Last 3 Encounters:  02/19/15 310 lb 3.2 oz (140.706 kg)  02/18/15 310 lb (140.615 kg)  01/14/15 308 lb 6.4 oz (139.889 kg)   General: Alert, oriented, no distress.  Skin: normal turgor, no rashes HEENT: Normocephalic, atraumatic. Pupils round and reactive; sclera anicteric; extraocular muscles intact; Fundi without hemorrhages or exudates Nose without nasal septal hypertrophy Mouth/Parynx:  Prominent mucoal soft tissue bilaterally on his inner cheeks.  Mallinpatti scale 3/4 Neck: No JVD, no carotid briuts Lungs: clear to ausculatation and percussion; no wheezing or rales  Chest wall: No tenderness to palpation Heart: RRR, s1 s2 normal; faint 1/6 systolic murmur.  No S3 or S4 gallop.  No rubs thrills or  heaves. Abdomen: Central adiposity; soft, nontender; no hepatosplenomehaly, BS+; abdominal aorta nontender and not dilated by palpation. Back: No CVA tenderness Pulses 2+ Extremities: no clubbinbg cyanosis or edema, Homan's sign negative  Neurologic: grossly nonfocal; cranial nerves intact. Psychological: Normal affect and mood.   LABS:  BMP Latest Ref Rng 02/18/2015 07/18/2014 04/17/2014  Glucose 65 - 99 mg/dL 158(H) 153(H) 300(H)  BUN 6 - 24 mg/dL _1 Creatinine 0.76 - 1.27 mg/dL 1.07 1.08 1.04  BUN/Creat Ratio 9 - 20 7(L) - -  Sodium 134 - 144 mmol/L 142 137 139  Potassium 3.5 - 5.2 mmol/L 4.1 3.9 4.1  Chloride 97 - 108 mmol/L 99 103 96  CO2 18 - 29 mmol/L _2 Calcium 8.7 - 10.2 mg/dL 8.9 9.0 9.1     Hepatic Function Latest Ref Rng 07/18/2014 04/17/2014 01/24/2013  Total Protein 6.0 - 8.3 g/dL 6.5 7.7 6.8  Albumin 3.5 - 5.2 g/dL 3.8 3.9 3.8  AST 0 - 37 U/L _3 ALT 0 - 53 U/L _4 Alk Phosphatase 39 - 117 U/L 62 90 72  Total Bilirubin 0.2 - 1.2 mg/dL 0.8 0.5 0.6  Bilirubin, Direct 0.0 - 0.3 mg/dL - - -     CBC Latest Ref Rng 04/17/2014 01/24/2013 01/15/2013  WBC 4.0 - 10.5 K/uL 7.3 6.1 -  Hemoglobin 13.0 - 17.0 g/dL 14.9 15.5 15.3  Hematocrit 39.0 - 52.0 % 45.4 46.1 45.0  Platelets 150 - 400 K/uL 159 165 -   Lab Results  Component Value Date   MCV 83.6 04/17/2014   MCV 77.5* 01/24/2013   MCV 78.5 10/04/2012    Lipid Panel     Component Value Date/Time   CHOL 152 07/18/2014 1056   TRIG 86 07/18/2014 1056   HDL 30* 07/18/2014 1056   CHOLHDL 5.1 07/18/2014 1056   VLDL 17 07/18/2014 1056   LDLCALC 105* 07/18/2014 1056     RADIOLOGY: No results found.    ASSESSMENT AND PLAN: Mr. Jakaden Ouzts is a 59 year old African-American male who is morbidly obese with a BMI of 40.90 kg/m, and has a history of hypertension, hyperlipidemia, and previous mild LV dysfunction noted in 2013 with an echo showing 40-45% ejection  fraction which had normalized  in May 2016.  He has documented severe LVH and severe left atrial dilatation.  In 2009 he was found to have very severe obstructive sleep apnea and initially was on CPAP therapy for several years but has not used this over the past several years.  He has tried to reinstitute therapy since June 2016 and has not had any supplies and continues to have old equipment.  Apparently, his orange card which had previously been accepted by his DME company is no longer being accepted.  We will try to see which DME companies are able to use his orange card insurance from [facility] so that he can receive new supplies and possibly a new machine.  Ultimately, he will will require a CPAP auto home titration or equipment such that we can reassess his pressure requirements.  I educated him on the severity of his very severe obstructive sleep apnea  noted in his 2009 split-night study, and its effect on his cardiovascular comorbidity if not treated.  I provided him with literature and answered all his questions.   Time spent: 30 minutes  Troy Sine, MD, [facility]  02/23/2015 10:16 AM

## 2015-02-24 NOTE — Progress Notes (Signed)
Internal Medicine Clinic Attending  Case discussed with Dr. Emokpae soon after the resident saw the patient.  We reviewed the resident's history and exam and pertinent patient test results.  I agree with the assessment, diagnosis, and plan of care documented in the resident's note. 

## 2015-03-03 ENCOUNTER — Encounter: Payer: Self-pay | Admitting: Cardiovascular Disease

## 2015-03-03 ENCOUNTER — Ambulatory Visit (INDEPENDENT_AMBULATORY_CARE_PROVIDER_SITE_OTHER): Payer: No Typology Code available for payment source | Admitting: Cardiovascular Disease

## 2015-03-03 VITALS — BP 169/104 | HR 84 | Resp 16 | Ht 73.0 in | Wt 308.3 lb

## 2015-03-03 DIAGNOSIS — I10 Essential (primary) hypertension: Secondary | ICD-10-CM

## 2015-03-03 DIAGNOSIS — I5022 Chronic systolic (congestive) heart failure: Secondary | ICD-10-CM

## 2015-03-03 DIAGNOSIS — G4733 Obstructive sleep apnea (adult) (pediatric): Secondary | ICD-10-CM

## 2015-03-03 DIAGNOSIS — I5043 Acute on chronic combined systolic (congestive) and diastolic (congestive) heart failure: Secondary | ICD-10-CM

## 2015-03-03 DIAGNOSIS — E118 Type 2 diabetes mellitus with unspecified complications: Secondary | ICD-10-CM

## 2015-03-03 DIAGNOSIS — E11319 Type 2 diabetes mellitus with unspecified diabetic retinopathy without macular edema: Secondary | ICD-10-CM

## 2015-03-03 DIAGNOSIS — I110 Hypertensive heart disease with heart failure: Secondary | ICD-10-CM

## 2015-03-03 DIAGNOSIS — E1165 Type 2 diabetes mellitus with hyperglycemia: Secondary | ICD-10-CM

## 2015-03-03 MED ORDER — FUROSEMIDE 40 MG PO TABS: 60.0000 mg | ORAL_TABLET | Freq: Every day | ORAL | Status: DC

## 2015-03-03 MED ORDER — AMLODIPINE BESYLATE 10 MG PO TABS: 10.0000 mg | ORAL_TABLET | Freq: Every day | ORAL | Status: DC

## 2015-03-03 NOTE — Progress Notes (Signed)
Patient ID: Terry Parks, male   DOB: Apr 26, 1956, 59 y.o.   MRN: [id number]     Cardiology Office Note   Date:  03/03/2015   ID:  Terry Parks, DOB March 30, 1956, MRN [id number]  PCP:  Charlott Rakes, MD  Cardiologist:   [name], MD   Chief Complaint  Patient presents with  . Follow-up  . Hypertension  . Congestive Heart Failure      History of Present Illness: Terry Parks is a 59 y.o. male who presents for  Follow-up of congestive heart failure with combined systolic and diastolic dysfunction. Most recent estimated ejection fraction was 25%. He is breathing much better than his previous appointment but is still clearly hypervolemic with moderate calf edema. He is probably about 10-13 pounds above his "dry weight". He does not have orthopnea or PND. He is having a lot of visual difficulty. He has been out of his Lantus for a few weeks "due to his own fault". He has been compliant with his blood pressure medications. About 2 weeks ago amlodipine was added. Blood pressure is still very high , even after we rechecked his blood pressure after 20 minutes at rest.  He reports compliance with CPAP. He just saw Dr. Claiborne Billings on October 5.   by echocardiography his ejection fraction in the past was as low as 40-45 percent , although it had improved by the echo in May 2 60-65 percent. His most recent nuclear study shows no evidence of ischemia but an ejection fraction of only 25%. He is now on maximum doses of angiotensin receptor blocker and carvedilol.  Past Medical History  Diagnosis Date  . Diabetes mellitus 04/08/2008  . Hypertension   . Hyperlipemia   . Obstructive sleep apnea 03/06/2008    Sleep study 03/06/08 showed severe OSA/hypopnea syndrome, with successful CPAP titration to 13 CWP using a medium ResMed Mirage Quattro full face mask with heated humidifier.   . Cardiomegaly     Mild by CXR 11/04/04  . BPH (benign prostatic hypertrophy)     Massive BPH noted on cystoscopy  1/23/ 2012 by Dr. Risa Grill.  . Renal calculus 05/29/2010    CT scan of abdomen/pelvis on 05/29/2010 showed an obstructing approximate 1-2 mm calculus at the left UVJ, and an approximate 1-2 mm left lower pole renal calculus.   Patient had continuing severe pain , and an elevation of his serum creatinine to a value of 1.75 on 06/06/2010.  The stone had apparently passed and was not seen on repeat CT 06/08/2010.  [name] Headache(784.0)   . Allergic rhinitis   . Neck pain   . Abscess of groin   . Abscessed tooth   . Hypertensive cardiopathy 03/01/2006    2-D echocardiogram 02/01/2012 showed moderate LVH, mildly to moderately reduced left ventricular systolic function with an estimated ejection fraction of 40-45%, and diffuse hypokinesis.  A nuclear medicine stress study done 01/31/2012 showed no reversible ischemia, a small mid anterior wall fixed defect/infarct, and ejection fraction 42%.        Past Surgical History  Procedure Laterality Date  . Cystoscopy w/ retrogrades       Current Outpatient Prescriptions  Medication Sig Dispense Refill  . amLODipine (NORVASC) 10 MG tablet Take 1 tablet (10 mg total) by mouth daily. 90 tablet 3  . aspirin 81 MG EC tablet Take 1 tablet (81 mg total) by mouth daily. 30 tablet 6  . atorvastatin (LIPITOR) 40 MG tablet Take 1 tablet (40 mg total) by mouth daily. 30 tablet  3  . carvedilol (COREG) 25 MG tablet Take 1 tablet (25 mg total) by mouth 2 (two) times daily. 60 tablet 6  . furosemide (LASIX) 40 MG tablet Take 1.5 tablets (60 mg total) by mouth daily. 135 tablet 3  . gabapentin (NEURONTIN) 300 MG capsule Take 2 capsules (600 mg total) by mouth 3 (three) times daily. (Patient taking differently: Take 600 mg by mouth 4 (four) times daily. ) 270 capsule 1  . glucose blood (TRUETRACK TEST) test strip Use as directed to test blood sugar three times a day before meals. 100 each 1  . Insulin Glargine (LANTUS SOLOSTAR) 100 UNIT/ML Solostar Pen Inject 44 Units into the skin  daily. 15 mL 3  . Ipratropium-Albuterol (COMBIVENT) 20-100 MCG/ACT AERS respimat Inhale 1 puff into the lungs every 6 (six) hours as needed for wheezing or shortness of breath. 4 g 6  . Lancets MISC Use to test blood sugar three times a day before meals. 100 each 1  . metFORMIN (GLUCOPHAGE) 1000 MG tablet Take 1 tablet (1,000 mg total) by mouth 2 (two) times daily with a meal. 60 tablet 11  . olmesartan (BENICAR) 40 MG tablet Take 1 tablet (40 mg total) by mouth daily. 90 tablet 3  . sildenafil (VIAGRA) 50 MG tablet Take 1 tablet (50 mg total) by mouth daily as needed for erectile dysfunction (Take 1 hour before sexual activity; do not take more than one dose in a 24 hour period.). 10 tablet 0  . [DISCONTINUED] albuterol (PROVENTIL HFA;VENTOLIN HFA) 108 (90 BASE) MCG/ACT inhaler Inhale 1-2 puffs into the lungs every 6 (six) hours as needed for wheezing or shortness of breath.    . [DISCONTINUED] chlorpheniramine (CHLOR-TRIMETON) 4 MG tablet Take 1 tablet (4 mg total) by mouth 2 (two) times daily as needed for allergies. 30 tablet 0   No current facility-administered medications for this visit.    Allergies:   Lisinopril    Social History:  The patient  reports that he has never smoked. He has never used smokeless tobacco. He reports that he does not drink alcohol or use illicit drugs.   Family History:  The patient's family history includes Breast cancer in his mother; Diabetes in his maternal grandmother; Hypertension in his father. There is no history of Colon cancer, Prostate cancer, or Heart attack.    ROS:  Please see the history of present illness.    Otherwise, review of systems positive for none.   All other systems are reviewed and negative.    PHYSICAL EXAM: VS:  BP 169/104 mmHg  Pulse 84  Resp 16  Ht 6\' 1"  (1.854 m)  Wt 308 lb 4.8 oz (139.844 kg)  BMI 40.68 kg/m2 , BMI Body mass index is 40.68 kg/(m^2).  General: Alert, oriented x3, no distress Head: no evidence of  trauma, PERRL, EOMI, no exophtalmos or lid lag, no myxedema, no xanthelasma; normal ears, nose and oropharynx Neck: normal jugular venous pulsations and no hepatojugular reflux; brisk carotid pulses without delay and no carotid bruits Chest: clear to auscultation, no signs of consolidation by percussion or palpation, normal fremitus, symmetrical and full respiratory excursions Cardiovascular: normal position and quality of the apical impulse, regular rhythm, normal first and second heart sounds, no murmurs, rubs or gallops Abdomen: no tenderness or distention, no masses by palpation, no abnormal pulsatility or arterial bruits, normal bowel sounds, no hepatosplenomegaly Extremities: no clubbing, cyanosis or edema; 2+ radial, ulnar and brachial pulses bilaterally; 2+ right femoral, posterior tibial and dorsalis  pedis pulses; 2+ left femoral, posterior tibial and dorsalis pedis pulses; no subclavian or femoral bruits Neurological: grossly nonfocal Psych: euthymic mood, full affect   EKG:  EKG is not ordered today.   Recent Labs: 04/17/2014: Hemoglobin 14.9; Platelets 159 07/18/2014: ALT 24; B Natriuretic Peptide 554.3* 02/18/2015: BUN 8; Creatinine, Ser 1.07; Potassium 4.1; Sodium 142    Lipid Panel    Component Value Date/Time   CHOL 152 07/18/2014 1056   TRIG 86 07/18/2014 1056   HDL 30* 07/18/2014 1056   CHOLHDL 5.1 07/18/2014 1056   VLDL 17 07/18/2014 1056   LDLCALC 105* 07/18/2014 1056      Wt Readings from Last 3 Encounters:  03/03/15 308 lb 4.8 oz (139.844 kg)  02/19/15 310 lb 3.2 oz (140.706 kg)  02/18/15 310 lb (140.615 kg)    .   ASSESSMENT AND PLAN:  1. Acute on chronic systolic and diastolic heart failure - no evidence of coronary insufficiency by nuclear stress test. Widely varying estimations of left ventricular ejection fraction , may be related to widely varying loading conditions (his blood pressure is at times extremely severely elevated. Plan to repeat  echocardiogram after his blood pressures found the well controlled. He remains hypervolemic and today we will increase his diuretic dose by 50%.   2. HTNive heart disease with  heart failure - His diastolic blood pressure has consistently been above 100 for the last year or so.  He is now on maximum usual doses of carvedilol and angiotensin receptor blocker. Will increase amlodipine to 10 mg daily. Suspect this will also be insufficient for BP control, but may be reducing his volume will also help. Might need to add BiDil.  3. Obstructive sleep apnea - Now followed by Dr. Ellouise Newer in the sleep clinic    Current medicines are reviewed at length with the patient today.  The patient does not have concerns regarding medicines.  The following changes have been made:   Increase amlodipine to 10 mg daily. Increase furosemide to 60 mg daily.  Labs/ tests ordered today include:  No orders of the defined types were placed in this encounter.     Patient Instructions  Dr Sallyanne Kuster has recommended making the following medication changes: INCREASE Amlodipine to 10 mg daily. INCREASE Furosemide to 60 mg daily.  Dr Sallyanne Kuster recommends that you schedule a follow-up appointment in 3 months.     Mikael Spray, MD  03/03/2015 2:24 PM    [name], MD, [facility] [phone] office [phone] pager

## 2015-03-03 NOTE — Patient Instructions (Addendum)
Dr Sallyanne Kuster has recommended making the following medication changes: INCREASE Amlodipine to 10 mg daily. INCREASE Furosemide to 60 mg daily.  Dr Sallyanne Kuster recommends that you schedule a follow-up appointment in 3 months.

## 2015-03-14 ENCOUNTER — Ambulatory Visit: Payer: Self-pay

## 2015-04-29 ENCOUNTER — Ambulatory Visit (INDEPENDENT_AMBULATORY_CARE_PROVIDER_SITE_OTHER): Payer: Self-pay | Admitting: Internal Medicine

## 2015-04-29 ENCOUNTER — Encounter: Payer: Self-pay | Admitting: Internal Medicine

## 2015-04-29 VITALS — BP 182/103 | HR 100 | Temp 97.4°F | Ht 73.0 in | Wt 299.8 lb

## 2015-04-29 DIAGNOSIS — Z7984 Long term (current) use of oral hypoglycemic drugs: Secondary | ICD-10-CM

## 2015-04-29 DIAGNOSIS — E1165 Type 2 diabetes mellitus with hyperglycemia: Secondary | ICD-10-CM

## 2015-04-29 DIAGNOSIS — K610 Anal abscess: Secondary | ICD-10-CM

## 2015-04-29 DIAGNOSIS — E1142 Type 2 diabetes mellitus with diabetic polyneuropathy: Secondary | ICD-10-CM

## 2015-04-29 DIAGNOSIS — Z794 Long term (current) use of insulin: Secondary | ICD-10-CM

## 2015-04-29 DIAGNOSIS — L02215 Cutaneous abscess of perineum: Secondary | ICD-10-CM

## 2015-04-29 LAB — BASIC METABOLIC PANEL
Anion gap: 7 (ref 5–15)
BUN: <5 mg/dL — ABNORMAL LOW (ref 6–20)
CHLORIDE: 100 mmol/L — AB (ref 101–111)
CO2: 28 mmol/L (ref 22–32)
Calcium: 9.2 mg/dL (ref 8.9–10.3)
Creatinine, Ser: 1.15 mg/dL (ref 0.61–1.24)
GFR calc Af Amer: >60 mL/min (ref >60)
GFR calc non Af Amer: >60 mL/min (ref >60)
GLUCOSE: 485 mg/dL — AB (ref 65–99)
POTASSIUM: 4.1 mmol/L (ref 3.5–5.1)
Sodium: 135 mmol/L (ref 135–145)

## 2015-04-29 LAB — POCT GLYCOSYLATED HEMOGLOBIN (HGB A1C): Hemoglobin A1C: >14

## 2015-04-29 LAB — GLUCOSE, CAPILLARY: GLUCOSE-CAPILLARY: 485 mg/dL — AB (ref 65–99)

## 2015-04-29 MED ORDER — DOXYCYCLINE HYCLATE 100 MG PO TABS: 100.0000 mg | ORAL_TABLET | Freq: Two times a day (BID) | ORAL | Status: DC

## 2015-04-29 MED ORDER — CEPHALEXIN 500 MG PO CAPS: 500.0000 mg | ORAL_CAPSULE | Freq: Four times a day (QID) | ORAL | Status: DC

## 2015-04-29 NOTE — Assessment & Plan Note (Signed)
Overview For the last 24 hours, he reports acute onset of pain in his perineal region which he feels is consistent with a boil. He denies any fevers, chills, nausea, vomiting, abdominal pain, dizziness, drainage.  Assessment Perineal abscess as noted on physical exam findings. Given his poorly controlled diabetes, he is at significant risk for systemic infection.  Plan -Prescribed Keflex 500 mg every 6 hours and doxycycline 100 mg twice daily 10 days -Provided him with return precautions  -Reassess in 1 week

## 2015-04-29 NOTE — Progress Notes (Signed)
   Subjective:    Patient ID: Terry Parks, male    DOB: 1955/07/17, 59 y.o.   MRN: [id number]  HPI Terry Parks is a 59 year old male with poorly controlled type 2 diabetes, obstructive sleep apnea, chronic combined systolic and diastolic heart failure, morbid obesity who presents today for neuropathy. Please see assessment & plan for status of chronic medical problems.  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  Review of Systems  Constitutional: Negative for diaphoresis.  Gastrointestinal: Negative for nausea, vomiting, abdominal pain and diarrhea.  Endocrine: Positive for polydipsia and polyuria.  Skin:       Dry rash.  Neurological: Negative for dizziness.       Objective:   Physical Exam  Constitutional: He is oriented to person, place, and time. He appears well-developed and well-nourished. No distress.  Morbidly obese, middle-aged African-American male.  HENT:  Head: Normocephalic and atraumatic.  Eyes: Conjunctivae and EOM are normal. Pupils are equal, round, and reactive to light. No scleral icterus.  Neck: Normal range of motion. Neck supple.  Cardiovascular: Normal rate, regular rhythm and normal heart sounds.  Exam reveals no gallop and no friction rub.   No murmur heard. Pulmonary/Chest: Effort normal and breath sounds normal. No respiratory distress. He has no wheezes. He has no rales. He exhibits no tenderness.  Abdominal: Soft. Bowel sounds are normal. He exhibits no distension. There is no tenderness. There is no rebound and no guarding.  Genitourinary:  Right scrotal lesion also noted without drainage, erythema.  Musculoskeletal: Normal range of motion.  Neurological: He is alert and oriented to person, place, and time. No cranial nerve deficit.  Skin: Skin is warm and dry. Rash (Small 2 x 2 centimeter abscess noted just posterior to the scrotum in the  perineal area tenderness to palpation. No erythema, drainage, warmth appreciated. ) noted. He is not diaphoretic.  Psychiatric: He has a normal mood and affect. His behavior is normal.          Assessment & Plan:

## 2015-04-29 NOTE — Patient Instructions (Addendum)
For your diabetes, please resume taking Lantus and metformin. We will have Ms. [name] get in touch with you about checking your sugars and getting a meter. I will also call you back about your bloodwork this afternoon.  For your skin infection, please take Keflex every 6 hours and doxycycline every 12 hours. If you develop fever, chills, worsening pain, redness, please call us back immediately.  For your leg pain, please start taking gabapentin 300 mg twice daily and then follow this plan:  Day 3: gabapentin 300mg  three times daily Day 6: gabapentin 600mg  three times daily Day 9: gabapentin 900mg  three times daily  Please stop if you feel more dizzy or tired and give [place] a call.

## 2015-04-29 NOTE — Assessment & Plan Note (Addendum)
Overview He expresses frustrations with me today about management of his neuropathic pain. Ever since his PCP Dr. Marinda Elk retired, he feels he is seeing multiple providers in this clinic who just continue to prescribe medications without adequate relief of the tingling pain he experiences in bilateral lower legs which affect his ability to sleep.   At his last office visit 02/18/15, he was instructed to take gabapentin 600 mg 3 times daily though reports he has been taking 4 capsules in the morning and evening with no relief. He cannot recall the dose of the medication, so it is presumed he is taking 300 mg capsules. As he has felt no relief of his pain, he stopped taking the medication over the last several weeks.  As for his glycemic control, he reports he was doing well before falling back into his bad habits since he felt his diabetes has been cured. A1c was 8.5 back in March 2016. 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 He reports his glucometer is not working because it continuously reads "high"and would like to have another meter. Review his glucometers notable for only 3 readings of which to have been "high" on 12/12 and 12/13 with a third value of 377 back on 11/16. He denies any symptoms of hypoglycemia like dizziness, nausea, vomiting, abdominal pain, diaphoresis though does report polyuria and polydipsia and is confused about why he is experiencing these symptoms.  Assessment Diabetic neuropathy the setting of poor glycemic control. Given the history he reports, I am concerned about diabetic ketoacidosis and will check stat lab work today. Overall, he also expresses limited understanding of how his glycemic control is affecting his neuropathy and will require education and counseling.  Cost is a significant obstacle for  Lyrica, and he will require patients for the up titration of gabapentin to the maximum 3600 mg dosing.  Plan -Order stat BMET, urinalysis, urine microalbumine/creatinine ratio  -Spent extensive time explained to the patient reports her glycemic control and how it can manifest in end organ dysfunction, like the neuropathy he experiences, along with retinopathy and nephropathy -Performed foot exam today -Offered to refill Lantus 44 units at bedtime and metformin 1000 mg twice daily to which he told me he would get back to [place] about whether or not he needed the medication refilled -Referred him to Ms. [name] Plyler for additional nutrition/exercise input along with instruction on checking blood sugar -Advised the patient to resume gabapentin 300 milligrams twice daily given the cost of Lyrica to which he expressed agreement -Outlined the following regimen for him and told him that if he were to experience worsening dizziness and sedation to halt the up titration and give [place] a call back in the clinic  Day 3: gabapentin 300mg  three times daily Day 6: gabapentin 600mg  three times daily Day 9: gabapentin 900mg  three times daily  ADDENDUM 04/29/2015  6:09 PM:  Anion gap is not elevated and less suggestive of ketoacidosis. A1c greater than 14 consistent with poor control.

## 2015-04-30 LAB — URINALYSIS, ROUTINE W REFLEX MICROSCOPIC
BILIRUBIN UA: NEGATIVE
Ketones, UA: NEGATIVE
LEUKOCYTES UA: NEGATIVE
Nitrite, UA: NEGATIVE
PROTEIN UA: NEGATIVE
RBC UA: NEGATIVE
Specific Gravity, UA: >=1.03 — AB (ref 1.005–1.030)
UUROB: 0.2 mg/dL (ref 0.2–1.0)
pH, UA: 5.5 (ref 5.0–7.5)

## 2015-04-30 LAB — MICROALBUMIN / CREATININE URINE RATIO
Creatinine, Urine: 44.1 mg/dL
MICROALB/CREAT RATIO: 77.8 mg/g creat — ABNORMAL HIGH (ref 0.0–30.0)
MICROALBUM., U, RANDOM: 34.3 ug/mL

## 2015-04-30 NOTE — Progress Notes (Signed)
Talked with pt - states Dr Posey Pronto called pt last PM and talked about labs. Offer for make to make an appt with Debera Lat for instructions with diet.

## 2015-05-05 NOTE — Progress Notes (Signed)
Internal Medicine Clinic Attending  Case discussed with Dr. Patel,Rushil at the time of the visit.  We reviewed the resident's history and exam and pertinent patient test results.  I agree with the assessment, diagnosis, and plan of care documented in the resident's note.  

## 2015-05-15 ENCOUNTER — Telehealth: Payer: Self-pay | Admitting: Internal Medicine

## 2015-05-15 NOTE — Telephone Encounter (Signed)
Talked to pt - he had to go out of town to take care of his mother so he did fill his rxs. Asked if I could call them in to [facility] for him. Keflex and Doxycycline  rxs called to [place].

## 2015-05-15 NOTE — Telephone Encounter (Signed)
Pt states he was seen at the office 2 week ago and prescribed antibiotic, and it is not at the pharmacy. Please call pt back.

## 2015-05-15 NOTE — Telephone Encounter (Signed)
He needs to follow-up to reassess his wound and glycemic control. I suspect I won't have openings until later in the month, but please try to schedule him in as well. Thank you.

## 2015-05-21 NOTE — Telephone Encounter (Signed)
Per [facility], he has an appt 1/5 with Dr Lovena Le.

## 2015-05-22 ENCOUNTER — Ambulatory Visit (INDEPENDENT_AMBULATORY_CARE_PROVIDER_SITE_OTHER): Payer: Self-pay | Admitting: Dietician

## 2015-05-22 ENCOUNTER — Encounter (INDEPENDENT_AMBULATORY_CARE_PROVIDER_SITE_OTHER): Payer: Self-pay | Admitting: Internal Medicine

## 2015-05-22 ENCOUNTER — Other Ambulatory Visit: Payer: Self-pay | Admitting: Internal Medicine

## 2015-05-22 DIAGNOSIS — E113392 Type 2 diabetes mellitus with moderate nonproliferative diabetic retinopathy without macular edema, left eye: Secondary | ICD-10-CM

## 2015-05-22 DIAGNOSIS — Z794 Long term (current) use of insulin: Secondary | ICD-10-CM

## 2015-05-22 DIAGNOSIS — Z713 Dietary counseling and surveillance: Secondary | ICD-10-CM

## 2015-05-22 DIAGNOSIS — E1142 Type 2 diabetes mellitus with diabetic polyneuropathy: Secondary | ICD-10-CM

## 2015-05-22 DIAGNOSIS — G4733 Obstructive sleep apnea (adult) (pediatric): Secondary | ICD-10-CM

## 2015-05-22 DIAGNOSIS — E113291 Type 2 diabetes mellitus with mild nonproliferative diabetic retinopathy without macular edema, right eye: Secondary | ICD-10-CM

## 2015-05-22 DIAGNOSIS — E1165 Type 2 diabetes mellitus with hyperglycemia: Secondary | ICD-10-CM

## 2015-05-22 LAB — HM DIABETES EYE EXAM

## 2015-05-22 MED ORDER — INSULIN GLARGINE 100 UNIT/ML SOLOSTAR PEN: 44.0000 [IU] | PEN_INJECTOR | Freq: Every day | SUBCUTANEOUS | Status: DC

## 2015-05-22 NOTE — Progress Notes (Deleted)
Patient ID: Terry Parks, male   DOB: Jan 21, 1956, 60 y.o.   MRN: [id number]    Subjective:   Patient ID: Terry Parks male   DOB: 12-07-1955 60 y.o.   MRN: [id number]  HPI: [name] is a 60 y.o. male with PMH as below, here for follow up of perineal abscess.  Please see Problem-Based charting for the status of the patient's chronic medical issues.     Past Medical History  Diagnosis Date  . Diabetes mellitus 04/08/2008  . Hypertension   . Hyperlipemia   . Obstructive sleep apnea 03/06/2008    Sleep study 03/06/08 showed severe OSA/hypopnea syndrome, with successful CPAP titration to 13 CWP using a medium ResMed Mirage Quattro full face mask with heated humidifier.   . Cardiomegaly     Mild by CXR 11/04/04  . BPH (benign prostatic hypertrophy)     Massive BPH noted on cystoscopy 1/23/ 2012 by Dr. Risa Grill.  . Renal calculus 05/29/2010    CT scan of abdomen/pelvis on 05/29/2010 showed an obstructing approximate 1-2 mm calculus at the left UVJ, and an approximate 1-2 mm left lower pole renal calculus.   Patient had continuing severe pain , and an elevation of his serum creatinine to a value of 1.75 on 06/06/2010.  The stone had apparently passed and was not seen on repeat CT 06/08/2010.  [name] Headache(784.0)   . Allergic rhinitis   . Neck pain   . Abscess of groin   . Abscessed tooth   . Hypertensive cardiopathy 03/01/2006    2-D echocardiogram 02/01/2012 showed moderate LVH, mildly to moderately reduced left ventricular systolic function with an estimated ejection fraction of 40-45%, and diffuse hypokinesis.  A nuclear medicine stress study done 01/31/2012 showed no reversible ischemia, a small mid anterior wall fixed defect/infarct, and ejection fraction 42%.       Current Outpatient Prescriptions  Medication Sig Dispense Refill  . amLODipine (NORVASC) 10 MG tablet Take 1 tablet (10 mg total) by mouth daily. 90 tablet 3  . aspirin 81 MG EC tablet Take 1 tablet (81 mg total)  by mouth daily. 30 tablet 6  . atorvastatin (LIPITOR) 40 MG tablet Take 1 tablet (40 mg total) by mouth daily. 30 tablet 3  . carvedilol (COREG) 25 MG tablet Take 1 tablet (25 mg total) by mouth 2 (two) times daily. 60 tablet 6  . cephALEXin (KEFLEX) 500 MG capsule Take 1 capsule (500 mg total) by mouth 4 (four) times daily. 40 capsule 0  . doxycycline (VIBRA-TABS) 100 MG tablet Take 1 tablet (100 mg total) by mouth 2 (two) times daily. 20 tablet 0  . furosemide (LASIX) 40 MG tablet Take 1.5 tablets (60 mg total) by mouth daily. 135 tablet 3  . gabapentin (NEURONTIN) 300 MG capsule Take 2 capsules (600 mg total) by mouth 3 (three) times daily. (Patient taking differently: Take 600 mg by mouth 4 (four) times daily. ) 270 capsule 1  . glucose blood (TRUETRACK TEST) test strip Use as directed to test blood sugar three times a day before meals. 100 each 1  . Insulin Glargine (LANTUS SOLOSTAR) 100 UNIT/ML Solostar Pen Inject 44 Units into the skin daily. 15 mL 3  . Ipratropium-Albuterol (COMBIVENT) 20-100 MCG/ACT AERS respimat Inhale 1 puff into the lungs every 6 (six) hours as needed for wheezing or shortness of breath. 4 g 6  . Lancets MISC Use to test blood sugar three times a day before meals. 100 each 1  . metFORMIN (GLUCOPHAGE) 1000 MG  tablet Take 1 tablet (1,000 mg total) by mouth 2 (two) times daily with a meal. 60 tablet 11  . olmesartan (BENICAR) 40 MG tablet Take 1 tablet (40 mg total) by mouth daily. 90 tablet 3  . sildenafil (VIAGRA) 50 MG tablet Take 1 tablet (50 mg total) by mouth daily as needed for erectile dysfunction (Take 1 hour before sexual activity; do not take more than one dose in a 24 hour period.). 10 tablet 0  . [DISCONTINUED] albuterol (PROVENTIL HFA;VENTOLIN HFA) 108 (90 BASE) MCG/ACT inhaler Inhale 1-2 puffs into the lungs every 6 (six) hours as needed for wheezing or shortness of breath.    . [DISCONTINUED] chlorpheniramine (CHLOR-TRIMETON) 4 MG tablet Take 1 tablet (4 mg  total) by mouth 2 (two) times daily as needed for allergies. 30 tablet 0   No current facility-administered medications for this visit.   Family History  Problem Relation Age of Onset  . Breast cancer Mother   . Colon cancer Neg Hx   . Prostate cancer Neg Hx   . Heart attack Neg Hx   . Hypertension Father   . Diabetes Maternal Grandmother    Social History   Social History  . Marital Status: Single    Spouse Name: N/A  . Number of Children: 4 b  . Years of Education: 16   Occupational History  .        Social History Main Topics  . Smoking status: Never Smoker   . Smokeless tobacco: Never Used  . Alcohol Use: No  . Drug Use: No  . Sexual Activity: Not Currently   Other Topics Concern  . Not on file   Social History Narrative   Divorced, 4 children, lives alone.  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   Review of Systems: {Review Of Systems:30496} Objective:  Physical Exam: There were no vitals filed for this visit. {Exam, Complete:17964} Assessment & Plan:   Patient and case were discussed with Dr. [name]  Please refer to Problem Based charting for further documentation.

## 2015-05-22 NOTE — Addendum Note (Signed)
Addended by: Viviano Simas A on: 05/22/2015 04:41 PM   Modules accepted: Orders

## 2015-05-23 ENCOUNTER — Encounter: Payer: Self-pay | Admitting: Licensed Clinical Social Worker

## 2015-05-23 ENCOUNTER — Encounter: Payer: Self-pay | Admitting: Dietician

## 2015-05-23 NOTE — Progress Notes (Signed)
Please see Social Work/Documentation note on 05/22/2014.

## 2015-05-23 NOTE — Patient Instructions (Addendum)
I have scheduled you for an appointment on 06/20/15 at 2:30 PM. This is after you see Dr. Posey Pronto.  Please try to take your lantus every day  As directed.   Take the enclosed prescription and voucher for a free box of lantus solostar pens to a retail pharmacy.   [name] [phone]

## 2015-05-23 NOTE — Progress Notes (Signed)
  Medical Nutrition Therapy:  Appt start time: 1500 end time:  1600. Visit # 1 this year, but patient well known to CDE  Assessment:  Primary concerns today: high blood sugars and sleep.  Very poor sleep due to severe sleep apnea, drinks soda and eats carbs and sweets to feel more energized and try to stay awake during the day. He falls asleep often during the day. Currently unemployed, so cannot afford to replace worn out cpap accessories and reports the machine was not set correctly for him so has not worn the cpap in quite a while. Feels that his sleep apnea would be better if he could lose some weight. He is on a vicious cycle that better sleep may help.  Fear of needles- it took him > 5 minutes to prick his finger for a blood sugar test today. Suggest minimizing injections and finger sticks for better long-term outcomes.   Preferred Learning Style: No preference indicated  Learning Readiness: Ready  Support:  Has supportive church members ANTHROPOMETRICS: weight-297#, BMI-39 WEIGHT HISTORY: ~ 300# +/- 20# for past 8 years SLEEP:poor, need assistance with Cpap settings, equipment and weight loss  MEDICATIONS: taking metformin feet numb and very painful(? B12 vs high blood sugar)Has been out of insulin for 1.5 months because the health depart MAP program did not have samples and his has not come in. He reports that he has turned in all the papers they requested Sample lantus pen given to patient today, but will last ~ 6 days. He was mailed a voucher for a box of 5 lantus solostar insulin pens and a printed rx to fill it. BLOOD SUGAR:CBG in office today was 330 fasting( patient thinks) . DIETARY INTAKE: Usual eating pattern includes 3 meals and 3 snacks per day. Everyday foods include likes vegetables, fruits, but often too tired for food preparation, is trying to eat Sandwiches and yogurt to help himself cut down on sweets, .   24-hr recall not done today Beverages: pepsi and sweet tea, milk    Usual physical activity: walks for adls but no formal exercsie  Estimated daily energy needs: 2500 (wt loss)-3000 calories 280-330 g carbohydrates   Progress Towards Goal(s):  In progress.   Nutritional Diagnosis:  [place]-2.2 Altered nutrition-related laboratory As related to lack of sleep, increased intake of soda and sweets and no insulin for 1.5 months.  As evidenced by his report and his A1C of >14 and CBG of 330.    Intervention:  Nutrition education about impact of lack of sleep on CBGs, diabetes medications, tips to encourage easy and lower carb food prep Coordination of care: suggest patient be referred for Cpap assistance, social work referral to assist with disability and use diabetes medicines that encourage weight loss and do not cause low blood sugar to minimize need for self monitoring.   Teaching Method Utilized: Visual, Auditory,hands on Handouts given during visit include: Barriers to learning/adherence to lifestyle change: lack of sleep, lack of finances Demonstrated degree of understanding via:  Teach Back   Monitoring/Evaluation:  Dietary intake, exercise, meter, and body weight in 2 week(s).

## 2015-05-23 NOTE — Progress Notes (Signed)
[name] was referred to CSW by [facility] CDE, D. Plyer regarding CPAP assistance and assistance with disability.  Currently, [name] is uninsured but has [place] for referrals.  CDE note indicated pt needed assistance with the financial cost of CPAP supplies.  However, this worker unsure how long patient has had this current machine as he states his settings are incorrect and has not been using the CPAP.  [name] most likely will need to have referral and updated sleep study.  At this time there are no financial assistance programs that this worker is aware of available to cover CPAP machines without verified physician settings and physician to sign off on machine.  After chart review, [name] has initiated a disability application as release and request for records has been scanned into [name] chart on 01/2015.  CSW will sign off.  Please re-refer as needed.

## 2015-05-26 NOTE — Progress Notes (Signed)
This encounter was created in error - please disregard.

## 2015-05-27 ENCOUNTER — Other Ambulatory Visit: Payer: Self-pay | Admitting: Internal Medicine

## 2015-05-27 DIAGNOSIS — G4733 Obstructive sleep apnea (adult) (pediatric): Secondary | ICD-10-CM

## 2015-05-27 NOTE — Progress Notes (Signed)
Per his visit with CDE on 05/22/15, he reports not being adherent to CPAP because of the wrong settings and not having the requisite accessories. Per conversations with SW on 05/23/15, it appears he may need an updated sleep study. I will go ahead and place referral today.  Of note, he underwent sleep study on 03/06/2008 which was interpreted by Dr. Baird Lyons with the following recommendations.  IMPRESSIONS/RECOMMENDATIONS: 1. Severe obstructive sleep apnea/hypopnea syndrome, AHI 108.5 per  hour with nonpositional events. Moderately loud snoring and oxygen  desaturation to a nadir of 75%. 2. Successful CPAP titration to 13 CWP, AHI 0 per hour. He chose a  medium ResMed Mirage Quattro full face mask with heated humidifier.

## 2015-05-27 NOTE — Progress Notes (Signed)
Dr. Posey Pronto, Please note.  Pt is currently uninsured.  If pt does receive new sleep study which indicates need for updated CPAP machine; there is no charity care options available for this DME item.

## 2015-05-27 NOTE — Addendum Note (Signed)
Addended by: Riccardo Dubin on: 05/27/2015 11:52 AM   Modules accepted: Orders

## 2015-05-28 NOTE — Progress Notes (Signed)
Any chance he qualifies for the [place]?

## 2015-05-29 ENCOUNTER — Telehealth: Payer: Self-pay | Admitting: Licensed Clinical Social Worker

## 2015-05-29 NOTE — Progress Notes (Signed)
According to D. Hill, Development worker, community, Terry Parks has been approved for [facility] [employer]) and was provided with [facility] card until 10/28/2015.

## 2015-05-29 NOTE — Telephone Encounter (Signed)
CSW returned call to [name].  Pt states he is trying to take steps to improve his health.  Most recently would like to start using his CPAP machine; at this time he is in need of new supplies.  Pt is uninsured and would be private pay for updated CPAP supplies, pt states machine is still in working condition.  [name] also states he has questions regarding the use of his CPAP machine.  Pt met with Dr. Claiborne Billings at the [facility] not in pt's EMR dated 02/19/15.   CSW informed [name]; [facility] no longer offers charity care for CPAP/supplies, as program ended approximately 4 years ago.  CSW contacted [facility] to obtain the private pay cost of items pt states he was in need of; a new face mask and headgear would be approx $201.  However, [facility] rep indicated pt most likely would need filters, tubing and humidifier changed.  CSW discussed the potential of program through ASAA (American [employer]) but there is some cost with the program.  Pt voiced understanding and would like CSW to mail information.  [name] discussed the need of having a review of how to use his current CPAP machine.  CSW informed [name] of the potential to have a respiratory therapist meet with him prior to/following a scheduled [facility] appointment, however, CSW will need to discuss with PCP and confirm Respiratory Therapist would be able to assist.  Pt aware CSW would notify him if this is an option once he has secured all needed equipment and supplies for his current machine.  Pt is in the process of applying for disability as he states he has been unable to work for several years.  Pt has not applied for Adult Medicaid.  CSW informed [name] of the option of applying for [employer] Medicaid based on medical debt; if this pertained to his circumstances.  CSW provided [name] with the contact information to [facility].  Letter mailed with the above resources.

## 2015-06-05 ENCOUNTER — Ambulatory Visit (HOSPITAL_BASED_OUTPATIENT_CLINIC_OR_DEPARTMENT_OTHER): Payer: Self-pay | Attending: Cardiovascular Disease

## 2015-06-05 ENCOUNTER — Ambulatory Visit (INDEPENDENT_AMBULATORY_CARE_PROVIDER_SITE_OTHER): Payer: Self-pay | Admitting: Cardiovascular Disease

## 2015-06-05 ENCOUNTER — Encounter: Payer: Self-pay | Admitting: Cardiovascular Disease

## 2015-06-05 ENCOUNTER — Other Ambulatory Visit: Payer: Self-pay | Admitting: Internal Medicine

## 2015-06-05 VITALS — BP 142/90 | HR 90 | Ht 73.0 in | Wt 294.9 lb

## 2015-06-05 VITALS — Ht 73.0 in | Wt 294.0 lb

## 2015-06-05 DIAGNOSIS — Z79899 Other long term (current) drug therapy: Secondary | ICD-10-CM | POA: Insufficient documentation

## 2015-06-05 DIAGNOSIS — G4733 Obstructive sleep apnea (adult) (pediatric): Secondary | ICD-10-CM

## 2015-06-05 DIAGNOSIS — G4761 Periodic limb movement disorder: Secondary | ICD-10-CM | POA: Insufficient documentation

## 2015-06-05 DIAGNOSIS — I5042 Chronic combined systolic (congestive) and diastolic (congestive) heart failure: Secondary | ICD-10-CM

## 2015-06-05 DIAGNOSIS — I10 Essential (primary) hypertension: Secondary | ICD-10-CM

## 2015-06-05 DIAGNOSIS — E1142 Type 2 diabetes mellitus with diabetic polyneuropathy: Secondary | ICD-10-CM

## 2015-06-05 DIAGNOSIS — E785 Hyperlipidemia, unspecified: Secondary | ICD-10-CM

## 2015-06-05 DIAGNOSIS — E11319 Type 2 diabetes mellitus with unspecified diabetic retinopathy without macular edema: Secondary | ICD-10-CM

## 2015-06-05 DIAGNOSIS — I110 Hypertensive heart disease with heart failure: Secondary | ICD-10-CM

## 2015-06-05 DIAGNOSIS — Z7982 Long term (current) use of aspirin: Secondary | ICD-10-CM | POA: Insufficient documentation

## 2015-06-05 MED ORDER — FUROSEMIDE 40 MG PO TABS: 40.0000 mg | ORAL_TABLET | Freq: Every day | ORAL | Status: DC

## 2015-06-05 NOTE — Addendum Note (Signed)
Addended by: Hulan Fray on: 06/05/2015 08:07 PM   Modules accepted: Orders

## 2015-06-05 NOTE — Progress Notes (Signed)
Patient ID: Terry Parks, male   DOB: [birth year], 58 y.o.   MRN: [id number]    Cardiology Office Note    Date:  06/06/2015   ID:  Terry Parks, DOB May 06, 1956, MRN [id number]  PCP:  Charlott Rakes, MD  Cardiologist:   [name], MD   Chief Complaint  Patient presents with  . ROV 3 months    patient reports "alittle" shortness of breath on exertion, muscle cramps in legs, swelling in legs/feet/ankles    History of Present Illness:  Terry Parks is a 60 y.o. male with  combined systolic and diastolic heart failure. Most recent estimated ejection fraction was 25%. He weighs about 15 pounds less than in October. He complains of dizziness and dry skin. He does not have orthopnea or PND. He is still having a lot of visual difficulty. He has been compliant with his blood pressure medications. Blood pressure is only minimally high. Glycemic control is poor: last HgbA1c was >14%.  He reports compliance with CPAP. He just saw Dr. Claiborne Billings on October 5. He is on maximum doses of angiotensin receptor blocker and carvedilol, amlodipine and a relatively low dose of loop diuretics.  His BP when checked by me was 131/89 sitting, 117/77 standing.  Past Medical History  Diagnosis Date  . Diabetes mellitus 04/08/2008  . Hypertension   . Hyperlipemia   . Obstructive sleep apnea 03/06/2008    Sleep study 03/06/08 showed severe OSA/hypopnea syndrome, with successful CPAP titration to 13 CWP using a medium ResMed Mirage Quattro full face mask with heated humidifier.   . Cardiomegaly     Mild by CXR 11/04/04  . BPH (benign prostatic hypertrophy)     Massive BPH noted on cystoscopy 1/23/ 2012 by Dr. Risa Grill.  . Renal calculus 05/29/2010    CT scan of abdomen/pelvis on 05/29/2010 showed an obstructing approximate 1-2 mm calculus at the left UVJ, and an approximate 1-2 mm left lower pole renal calculus.   Patient had continuing severe pain , and an elevation of his serum creatinine to a value of 1.75 on  06/06/2010.  The stone had apparently passed and was not seen on repeat CT 06/08/2010.  [name] Headache(784.0)   . Allergic rhinitis   . Neck pain   . Abscess of groin   . Abscessed tooth   . Hypertensive cardiopathy 03/01/2006    2-D echocardiogram 02/01/2012 showed moderate LVH, mildly to moderately reduced left ventricular systolic function with an estimated ejection fraction of 40-45%, and diffuse hypokinesis.  A nuclear medicine stress study done 01/31/2012 showed no reversible ischemia, a small mid anterior wall fixed defect/infarct, and ejection fraction 42%.        Past Surgical History  Procedure Laterality Date  . Cystoscopy w/ retrogrades      Outpatient Prescriptions Prior to Visit  Medication Sig Dispense Refill  . amLODipine (NORVASC) 10 MG tablet Take 1 tablet (10 mg total) by mouth daily. 90 tablet 3  . aspirin 81 MG EC tablet Take 1 tablet (81 mg total) by mouth daily. 30 tablet 6  . atorvastatin (LIPITOR) 40 MG tablet Take 1 tablet (40 mg total) by mouth daily. 30 tablet 3  . carvedilol (COREG) 25 MG tablet Take 1 tablet (25 mg total) by mouth 2 (two) times daily. 60 tablet 6  . gabapentin (NEURONTIN) 300 MG capsule Take 2 capsules (600 mg total) by mouth 3 (three) times daily. (Patient taking differently: Take 600 mg by mouth 4 (four) times daily. ) 270 capsule 1  .  glucose blood (TRUETRACK TEST) test strip Use as directed to test blood sugar three times a day before meals. 100 each 1  . Insulin Glargine (LANTUS SOLOSTAR) 100 UNIT/ML Solostar Pen Inject 44 Units into the skin daily. 15 mL 0  . Ipratropium-Albuterol (COMBIVENT) 20-100 MCG/ACT AERS respimat Inhale 1 puff into the lungs every 6 (six) hours as needed for wheezing or shortness of breath. 4 g 6  . Lancets MISC Use to test blood sugar three times a day before meals. 100 each 1  . metFORMIN (GLUCOPHAGE) 1000 MG tablet Take 1 tablet (1,000 mg total) by mouth 2 (two) times daily with a meal. 60 tablet 11  . olmesartan  (BENICAR) 40 MG tablet Take 1 tablet (40 mg total) by mouth daily. 90 tablet 3  . sildenafil (VIAGRA) 50 MG tablet Take 1 tablet (50 mg total) by mouth daily as needed for erectile dysfunction (Take 1 hour before sexual activity; do not take more than one dose in a 24 hour period.). 10 tablet 0  . furosemide (LASIX) 40 MG tablet Take 1.5 tablets (60 mg total) by mouth daily. 135 tablet 3  . cephALEXin (KEFLEX) 500 MG capsule Take 1 capsule (500 mg total) by mouth 4 (four) times daily. (Patient not taking: Reported on 06/05/2015) 40 capsule 0  . doxycycline (VIBRA-TABS) 100 MG tablet Take 1 tablet (100 mg total) by mouth 2 (two) times daily. (Patient not taking: Reported on 06/05/2015) 20 tablet 0   No facility-administered medications prior to visit.     Allergies:   Lisinopril   Social History   Social History  . Marital Status: Single    Spouse Name: N/A  . Number of Children: 4 b  . Years of Education: 16   Occupational History  .        Social History Main Topics  . Smoking status: Never Smoker   . Smokeless tobacco: Never Used  . Alcohol Use: No  . Drug Use: No  . Sexual Activity: Not Currently   Other Topics Concern  . None   Social History Narrative   Divorced, 4 children, lives alone.  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     Family History:  The patient's family history includes Breast cancer in his mother; Diabetes in his maternal grandmother; Hypertension in his father. There is no history of Colon cancer, Prostate cancer, or Heart attack.   ROS:   Please see the history of present illness.   He feels weak if he does not eat on time. ROS All other systems reviewed and are negative.   PHYSICAL EXAM:   VS:  BP 142/90 mmHg  Pulse 90  Ht 6\' 1"  (1.854 m)  Wt 294 lb 14.4 oz (133.766 kg)  BMI 38.92 kg/m2   GEN: Well nourished, well developed, in no acute distress HEENT: normal Neck: no JVD, carotid bruits, or masses Cardiac:RRR; no murmurs, rubs, or gallops,no  edema. Displaced apical impulse.  Respiratory:  clear to auscultation bilaterally, normal work of breathing GI: soft, nontender, nondistended, + BS MS: no deformity or atrophy Skin: warm and dry, no rash Neuro:  Alert and Oriented x 3, Strength and sensation are intact Psych: euthymic mood, full affect  Wt Readings from Last 3 Encounters:  06/05/15 294 lb (133.358 kg)  06/05/15 294 lb 14.4 oz (133.766 kg)  05/22/15 297 lb 8 oz (134.945 kg)      Studies/Labs Reviewed:   EKG:  EKG is ordered today.  The ekg ordered today demonstrates NSR, LAD ("  almost" LAFB).  Recent Labs: 07/18/2014: ALT 24; B Natriuretic Peptide 554.3* 04/29/2015: BUN <5*; Creatinine, Ser 1.15; Potassium 4.1; Sodium 135   Lipid Panel    Component Value Date/Time   CHOL 152 07/18/2014 1056   TRIG 86 07/18/2014 1056   HDL 30* 07/18/2014 1056   CHOLHDL 5.1 07/18/2014 1056   VLDL 17 07/18/2014 1056   LDLCALC 105* 07/18/2014 1056     ASSESSMENT:    1. Chronic combined systolic and diastolic CHF (congestive heart failure) ([place])   2. Essential hypertension   3. Hypertensive heart disease with heart failure ([place])   4. Obstructive sleep apnea   5. Type 2 diabetes mellitus with peripheral neuropathy (H[facility])   6. Diabetic retinopathy associated with type 2 diabetes mellitus, macular edema presence unspecified, unspecified retinopathy severity ([place])   7. Morbid obesity due to excess calories ([place])   8. Hyperlipidemia      PLAN:  In order of problems listed above:  1. CHF: NYHA class 1-2. Seems to be slightly hypovolemic, with an exaggerated drop in BP with orthostasis. Was clearly hypervolemic at 308 lb. Dry weight appears to be 295-300 lb. Reduce diuretics. 2. HTN  Now fairly well controlled 3. Suspect the broad fluctuations in LVEF had a lot to do with varying afterload/severity of HBP. Need to get a more definitve assay of LVEF now that BP is normal. 4. OSA: Requests a f/u appointment with Dr. Claiborne Billings to  discuss CPAP treatment 5. DM:  Very poor glycemic control is likely contributing to dehydration, exaggerating diuretic effect, also via neuropathy. 6. Retinopathy marks risk for progression of renal diabetic disease 7. In the long term, weight loss is important; short term weight changes are attributable to fluid shift 8. Target LDL<100, suspect improvement will occur if DM control is better.    Medication Adjustments/Labs and Tests Ordered: Current medicines are reviewed at length with the patient today.  Concerns regarding medicines are outlined above.  Medication changes, Labs and Tests ordered today are listed in the Patient Instructions below. Patient Instructions  Your physician has recommended you make the following change in your medication:   DO NOT TAKE FUROSEMIDE TODAY. TOMORROW DECREASE FUROSEMIDE TO 99 MG DAILY  Your physician has requested that you have an echocardiogram. Echocardiography is a painless test that uses sound waves to create images of your heart. It provides your doctor with information about the size and shape of your heart and how well your heart's chambers and valves are working. This procedure takes approximately one hour. There are no restrictions for this procedure.  MAKE AN APPOINTMENT WITH DR. [place].  Dr. Sallyanne Kuster recommends that you schedule a follow-up appointment in: [place], Pinkie Manger, MD  06/06/2015 9:43 PM    [facility] [place], [place], [place]  [address] Phone: [phone]; Fax: [phone]

## 2015-06-05 NOTE — Patient Instructions (Signed)
Your physician has recommended you make the following change in your medication:   DO NOT TAKE FUROSEMIDE TODAY. TOMORROW DECREASE FUROSEMIDE TO 63 MG DAILY  Your physician has requested that you have an echocardiogram. Echocardiography is a painless test that uses sound waves to create images of your heart. It provides your doctor with information about the size and shape of your heart and how well your heart's chambers and valves are working. This procedure takes approximately one hour. There are no restrictions for this procedure.  MAKE AN APPOINTMENT WITH DR. [place].  Dr. Sallyanne Kuster recommends that you schedule a follow-up appointment in: 3 MONTHS

## 2015-06-11 NOTE — Sleep Study (Signed)
Patient Name: Terry Parks, Terry Parks Date: 06/05/2015 Gender: Male D.O.B: 1955-07-16 Age (years): 60 Referring Provider: [name] [name] Height (inches): 71 Interpreting Physician: Shelva Majestic MD, ABSM Weight (lbs): 294 RPSGT: Baxter Flattery BMI: 41 MRN: [id number] Neck Size: 18.50  CLINICAL INFORMATION Sleep Study Type: Split Night CPAP Indication for sleep study: Congestive Heart Failure, Diabetes, Fatigue, Hypertension, OSA, Snoring, Witnessed Apneas Epworth Sleepiness Score: 17  SLEEP STUDY TECHNIQUE As per the AASM Manual for the Scoring of Sleep and Associated Events v2.3 (April 2016) with a hypopnea requiring 4% desaturations. The channels recorded and monitored were frontal, central and occipital EEG, electrooculogram (EOG), submentalis EMG (chin), nasal and oral airflow, thoracic and abdominal wall motion, anterior tibialis EMG, snore microphone, electrocardiogram, and pulse oximetry. Continuous positive airway pressure (CPAP) was initiated when the patient met split night criteria and was titrated according to treat sleep-disordered breathing.  MEDICATIONS  amLODipine (NORVASC) 10 MG tablet 10 mg, Daily     aspirin 81 MG EC tablet 81 mg, Daily     atorvastatin (LIPITOR) 40 MG tablet 40 mg, Daily     carvedilol (COREG) 25 MG tablet 25 mg, 2 times daily     furosemide (LASIX) 40 MG tablet 40 mg, Daily     gabapentin (NEURONTIN) 300 MG capsule 600 mg, 3 times daily     Patient taking differently: 600 mg Oral 4 times daily, Reported on 03/03/2015   glucose blood (TRUETRACK TEST) test strip      Insulin Glargine (LANTUS SOLOSTAR) 100 UNIT/ML Solostar Pen 44 Units, Daily     Ipratropium-Albuterol (COMBIVENT) 20-100 MCG/ACT AERS respimat 1 puff, Every 6 hours PRN     Lancets MISC      metFORMIN (GLUCOPHAGE) 1000 MG tablet 1,000 mg, 2 times daily with meals     olmesartan (BENICAR) 40 MG tablet 40 mg, Daily     sildenafil (VIAGRA) 50 MG tablet 50 mg, Daily PRN    Medications administered by patient during sleep study : No sleep medicine administered.  RESPIRATORY PARAMETERS Diagnostic Total AHI (/hr): 48.5 RDI (/hr): 48.5 OA Index (/hr): 5.2 CA Index (/hr): 0.4 REM AHI (/hr): 53.3 NREM AHI (/hr): 47.6 Supine AHI (/hr): 79.5 Non-supine AHI (/hr): 30.68 Min O2 Sat (%): 82.00 Mean O2 (%): 93.75 Time below 88% (min): 4.2   Titration Optimal Pressure (cm): 11 AHI at Optimal Pressure (/hr): 9.2 Min O2 at Optimal Pressure (%): 92.0 Supine % at Optimal (%): 0 Sleep % at Optimal (%): 99    SLEEP ARCHITECTURE The recording time for the entire night was 358.9 minutes. During a baseline period of 141.8 minutes, the patient slept for 138.6 minutes in REM and nonREM, yielding a sleep efficiency of 97.7%. Sleep onset after lights out was 1.3 minutes with a REM latency of 53.5 minutes. The patient spent 1.80% of the night in stage N1 sleep, 81.96% in stage N2 sleep, 0.00% in stage N3 and 16.24% in REM. During the titration period of 209.8 minutes, the patient slept for 195.0 minutes in REM and nonREM, yielding a sleep efficiency of 92.9%. Sleep onset after CPAP initiation was 6.1 minutes with a REM latency of 68.5 minutes. The patient spent 4.87% of the night in stage N1 sleep, 63.85% in stage N2 sleep, 0.00% in stage N3 and 31.28% in REM.  CARDIAC DATA The 2 lead EKG demonstrated sinus rhythm. The mean heart rate was 81.56 beats per minute. Other EKG findings include: None.  LEG MOVEMENT DATA The total Periodic Limb Movements of Sleep (  PLMS) were 200. The PLMS index was 35.93 .  IMPRESSIONS - Severe obstructive sleep apnea occurred during the diagnostic portion of the study (AHI = 48.5/hour). Events were worse during REM sleep (AHI 53.3/hour). An optimal PAP pressure was selected for this patient ( 11 cm of water) - No significant central sleep apnea occurred during the diagnostic portion of the study (CAI = 0.4/hour). - Moderate oxygen desaturation was noted  during the diagnostic portion  to a nadir of 82.00%. - No snoring was audible during the diagnostic portion of the study. - No cardiac abnormalities were noted during this study. - Severe periodic limb movement disorder of sleep with an PLMS index of 35.93.  DIAGNOSIS - Obstructive Sleep Apnea (327.23 [G47.33 ICD-10]) - Periodic Limb Movement Disorder   RECOMMENDATIONS - Recommend an initial trial of CPAP therapy with an EPR of 3 at 11 cm H2O with heated humidification .  A Large size Fisher&Paykel Full Face Mask Simplus mask was used for the CPAP titration. - Efforts should be made to optimize nasal and oropharyngeal patency. - Avoid alcohol, sedatives and other CNS depressants that may worsen sleep apnea and disrupt normal sleep architecture. - Sleep hygiene should be reviewed to assess factors that may improve sleep quality. - Weight management (BMI 41) and regular exercise should be initiated. - If patient is symptomatic with restless legs on CPAP therapy consider a trial of medical therapy. - Recommend a download in 30 days and sleep clinic evaluation.   Troy Sine, MD, [place], American Board of Sleep Medicine  ELECTRONICALLY SIGNED ON:  06/11/2015, 8:57 PM [place] PH: [phone]   FX: [phone] [place]

## 2015-06-12 ENCOUNTER — Encounter: Payer: Self-pay | Admitting: Cardiovascular Disease

## 2015-06-12 ENCOUNTER — Ambulatory Visit (INDEPENDENT_AMBULATORY_CARE_PROVIDER_SITE_OTHER): Payer: Self-pay | Admitting: Cardiovascular Disease

## 2015-06-12 VITALS — BP 150/94 | HR 88 | Ht 73.0 in | Wt 298.0 lb

## 2015-06-12 DIAGNOSIS — E1142 Type 2 diabetes mellitus with diabetic polyneuropathy: Secondary | ICD-10-CM

## 2015-06-12 DIAGNOSIS — I10 Essential (primary) hypertension: Secondary | ICD-10-CM

## 2015-06-12 DIAGNOSIS — G4710 Hypersomnia, unspecified: Secondary | ICD-10-CM

## 2015-06-12 DIAGNOSIS — G4733 Obstructive sleep apnea (adult) (pediatric): Secondary | ICD-10-CM

## 2015-06-12 MED ORDER — ARMODAFINIL 150 MG PO TABS: 150.0000 mg | ORAL_TABLET | Freq: Every day | ORAL | Status: DC

## 2015-06-12 MED ORDER — CARVEDILOL 25 MG PO TABS: 37.5000 mg | ORAL_TABLET | Freq: Two times a day (BID) | ORAL | Status: DC

## 2015-06-12 NOTE — Patient Instructions (Addendum)
Your physician has recommended you make the following change in your medication:   1.) the carvedilol has been increased to 1 & 1/2 tablets twice a day. ( 37.5 mg)  2.) start prescription for Nuvigil. Take only as needed for daytime sleepiness.  Your physician recommends that you schedule a follow-up appointment in: 3 months with Dr Claiborne Billings.

## 2015-06-16 ENCOUNTER — Ambulatory Visit (HOSPITAL_COMMUNITY): Payer: Self-pay | Attending: Internal Medicine

## 2015-06-16 ENCOUNTER — Other Ambulatory Visit (HOSPITAL_COMMUNITY): Payer: Self-pay | Admitting: *Deleted

## 2015-06-16 ENCOUNTER — Encounter: Payer: Self-pay | Admitting: Cardiovascular Disease

## 2015-06-16 ENCOUNTER — Other Ambulatory Visit: Payer: Self-pay

## 2015-06-16 DIAGNOSIS — I5042 Chronic combined systolic (congestive) and diastolic (congestive) heart failure: Secondary | ICD-10-CM | POA: Insufficient documentation

## 2015-06-16 DIAGNOSIS — I5020 Unspecified systolic (congestive) heart failure: Secondary | ICD-10-CM

## 2015-06-16 DIAGNOSIS — I517 Cardiomegaly: Secondary | ICD-10-CM | POA: Insufficient documentation

## 2015-06-16 MED ORDER — PERFLUTREN LIPID MICROSPHERE
1.0000 mL | INTRAVENOUS | Status: AC | PRN
  Administered 2015-06-16: 2 mL via INTRAVENOUS

## 2015-06-16 NOTE — Progress Notes (Signed)
Patient ID: Terry Parks, male   DOB: Apr 07, 1956, 60 y.o.   MRN: [id number]     HPI: Terry Parks is a 60 y.o. male with a history of severe obstructive sleep apnea.  I had seen him in October for sleep evaluation.  He presents for follow-up evaluation  Terry Parks has a history of hypertension, hyperlipidemia, morbid obesity and is felt to have functional class II exertional dyspnea.  In 2009 he was diagnosed with severe obstructive sleep apnea and had an AHI of 108.5 per hour.  His REM AHI was 105.  He was titrated up to a CPAP pressure of 13 cm water pressure and utilized a [employer] full face mask.  He is unemployed and has an orange card.  He has not used CPAP for years and previously had used [employer] as a DME company who in the past had accepted his orange card from [employer].  He saw Dr. Sallyanne Kuster in June and at that time, it became apparent that he had not been utilizing CPAP therapy.  The patient tried to resume using his CPAP.  However, [place] would no longer except his orange card; and  his supplies  are 58 years old.  He had been trying to use his mask which does not fit and he falls asleep holding the mask to his face.  Despite this suboptimal therapy, he has noted some improvement in how he feels with reference to fatigability, hypersomnolence.  Previously, his Epworth Sleepiness Scale score had been 18.  He did not quantify the Epworth Sleepiness scale score when I saw him, but checked that he still had a significant chance of dozing sitting and reading, sitting inactive in a public place, sitting as a passenger in a car for an hour without a break, sitting and talking with someone, sitting quietly after lunch, and in the car stopped for a few minutes in traffic.  He does snore.  He cannot sleep on his back.  He admits to fatigability.  He denies restless legs.  He denies hypogognic hallucinations.  He denies cataplectic events.  Since I last saw him, he  underwent a follow-up sleep study at [facility] long sleep disorder center on 06/05/2015.  This was a split-night protocol. He again was found to have severe obstructive sleep apnea during the diagnostic portion of the study with an AHI of48.5 per hour, and 58.3 per hour with rim sleep.  He has significant oxygen desaturation to 82%.  He was titrated up to 11 cm water pressure with heated communication.  He presents for reevaluation.  An Epworth sleepiness scale score today was recalculated and this endorsed at 21.  He has continued to use his is very old CPAP unit without recent supplies.  He presents for evaluation.  Past Medical History  Diagnosis Date  . Diabetes mellitus 04/08/2008  . Hypertension   . Hyperlipemia   . Obstructive sleep apnea 03/06/2008    Sleep study 03/06/08 showed severe OSA/hypopnea syndrome, with successful CPAP titration to 13 CWP using a medium [employer] Mirage Quattro full face mask with heated humidifier.   . Cardiomegaly     Mild by CXR 11/04/04  . BPH (benign prostatic hypertrophy)     Massive BPH noted on cystoscopy 1/23/ 2012 by Dr. Risa Grill.  . Renal calculus 05/29/2010    CT scan of abdomen/pelvis on 05/29/2010 showed an obstructing approximate 1-2 mm calculus at the left UVJ, and an approximate 1-2 mm left lower pole renal calculus.  Patient had continuing severe pain , and an elevation of his serum creatinine to a value of 1.75 on 06/06/2010.  The stone had apparently passed and was not seen on repeat CT 06/08/2010.  [name] Headache(784.0)   . Allergic rhinitis   . Neck pain   . Abscess of groin   . Abscessed tooth   . Hypertensive cardiopathy 03/01/2006    2-D echocardiogram 02/01/2012 showed moderate LVH, mildly to moderately reduced left ventricular systolic function with an estimated ejection fraction of 40-45%, and diffuse hypokinesis.  A nuclear medicine stress study done 01/31/2012 showed no reversible ischemia, a small mid anterior wall fixed defect/infarct, and  ejection fraction 42%.        Past Surgical History  Procedure Laterality Date  . Cystoscopy w/ retrogrades      Allergies  Allergen Reactions  . Lisinopril Cough    Current Outpatient Prescriptions  Medication Sig Dispense Refill  . amLODipine (NORVASC) 10 MG tablet Take 1 tablet (10 mg total) by mouth daily. 90 tablet 3  . aspirin 81 MG EC tablet Take 1 tablet (81 mg total) by mouth daily. 30 tablet 6  . atorvastatin (LIPITOR) 40 MG tablet Take 1 tablet (40 mg total) by mouth daily. 30 tablet 3  . carvedilol (COREG) 25 MG tablet Take 1.5 tablets (37.5 mg total) by mouth 2 (two) times daily. 60 tablet 6  . furosemide (LASIX) 40 MG tablet Take 1 tablet (40 mg total) by mouth daily. 90 tablet 3  . gabapentin (NEURONTIN) 300 MG capsule Take 2 capsules (600 mg total) by mouth 3 (three) times daily. (Patient taking differently: Take 600 mg by mouth 4 (four) times daily. ) 270 capsule 1  . glucose blood (TRUETRACK TEST) test strip Use as directed to test blood sugar three times a day before meals. 100 each 1  . Insulin Glargine (LANTUS SOLOSTAR) 100 UNIT/ML Solostar Pen Inject 44 Units into the skin daily. 15 mL 0  . Ipratropium-Albuterol (COMBIVENT) 20-100 MCG/ACT AERS respimat Inhale 1 puff into the lungs every 6 (six) hours as needed for wheezing or shortness of breath. 4 g 6  . Lancets MISC Use to test blood sugar three times a day before meals. 100 each 1  . metFORMIN (GLUCOPHAGE) 1000 MG tablet Take 1 tablet (1,000 mg total) by mouth 2 (two) times daily with a meal. 60 tablet 11  . olmesartan (BENICAR) 40 MG tablet Take 1 tablet (40 mg total) by mouth daily. 90 tablet 3  . sildenafil (VIAGRA) 50 MG tablet Take 1 tablet (50 mg total) by mouth daily as needed for erectile dysfunction (Take 1 hour before sexual activity; do not take more than one dose in a 24 hour period.). 10 tablet 0  . Armodafinil (NUVIGIL) 150 MG tablet Take 1 tablet (150 mg total) by mouth daily. As needed. 30 tablet 5   . [DISCONTINUED] albuterol (PROVENTIL HFA;VENTOLIN HFA) 108 (90 BASE) MCG/ACT inhaler Inhale 1-2 puffs into the lungs every 6 (six) hours as needed for wheezing or shortness of breath.    . [DISCONTINUED] chlorpheniramine (CHLOR-TRIMETON) 4 MG tablet Take 1 tablet (4 mg total) by mouth 2 (two) times daily as needed for allergies. 30 tablet 0   No current facility-administered medications for this visit.    Social History   Social History  . Marital Status: Single    Spouse Name: N/A  . Number of Children: 4 b  . Years of Education: 16   Occupational History  .  Social History Main Topics  . Smoking status: Never Smoker   . Smokeless tobacco: Never Used  . Alcohol Use: No  . Drug Use: No  . Sexual Activity: Not Currently   Other Topics Concern  . Not on file   Social History Narrative   Divorced, 4 children, lives alone.  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    Family History  Problem Relation Age of Onset  . Breast cancer Mother   . Colon cancer Neg Hx   . Prostate cancer Neg Hx   . Heart attack Neg Hx   . Hypertension Father   . Diabetes Maternal Grandmother      ROS General: Negative; No fevers, chills, or night sweats; positive for morbid obesity HEENT: Occasional changes in vision; no changes in hearing, sinus congestion, difficulty swallowing Pulmonary: Negative; No cough, wheezing, shortness of breath, hemoptysis Cardiovascular: Negative; No chest pain, presyncope, syncope, palpatations GI: Negative; No nausea, vomiting, diarrhea, or abdominal pain GU: Negative; No dysuria, hematuria, or difficulty voiding Musculoskeletal: Negative; no myalgias, joint pain, or weakness Hematologic: Negative; no easy bruising, bleeding Endocrine: Negative; no heat/cold intolerance Neuro: Negative; no changes in balance, headaches Skin: [place] of occasional dry skin Psychiatric: Negative; No behavioral problems, depression Sleep: see HPI   Physical Exam BP  150/94 mmHg  Pulse 88  Ht _0  (1.854 m)  Wt 298 lb (135.172 kg)  BMI 39.32 kg/m2  Wt Readings from Last 3 Encounters:  06/12/15 298 lb (135.172 kg)  06/05/15 294 lb (133.358 kg)  06/05/15 294 lb 14.4 oz (133.766 kg)   General: Alert, oriented, no distress.  Skin: normal turgor, no rashes HEENT: Normocephalic, atraumatic. Pupils round and reactive; sclera anicteric; extraocular muscles intact; Fundi without hemorrhages or exudates Nose without nasal septal hypertrophy Mouth/Parynx:  Prominent mucoal soft tissue bilaterally on his inner cheeks.  Mallinpatti scale 3/4 Neck: No JVD, no carotid briuts Lungs: clear to ausculatation and percussion; no wheezing or rales  Chest wall: No tenderness to palpation Heart: RRR, s1 s2 normal; faint 1/6 systolic murmur.  No S3 or S4 gallop.  No rubs thrills or heaves. Abdomen: Central adiposity; soft, nontender; no hepatosplenomehaly, BS+; abdominal aorta nontender and not dilated by palpation. Back: No CVA tenderness Pulses 2+ Extremities: no clubbinbg cyanosis or edema, Homan's sign negative  Neurologic: grossly nonfocal; cranial nerves intact. Psychological: Normal affect and mood.   LABS:  BMP Latest Ref Rng 04/29/2015 02/18/2015 07/18/2014  Glucose 65 - 99 mg/dL 485(H) 158(H) 153(H)  BUN 6 - 20 mg/dL <5(L) 8 15  Creatinine 0.61 - 1.24 mg/dL 1.15 1.07 1.08  BUN/Creat Ratio 9 - 20 - 7(L) -  Sodium 135 - 145 mmol/L 135 142 137  Potassium 3.5 - 5.1 mmol/L 4.1 4.1 3.9  Chloride 101 - 111 mmol/L 100(L) 99 103  CO2 22 - 32 mmol/L _1 Calcium 8.9 - 10.3 mg/dL 9.2 8.9 9.0     Hepatic Function Latest Ref Rng 07/18/2014 04/17/2014 01/24/2013  Total Protein 6.0 - 8.3 g/dL 6.5 7.7 6.8  Albumin 3.5 - 5.2 g/dL 3.8 3.9 3.8  AST 0 - 37 U/L _2 ALT 0 - 53 U/L _3 Alk Phosphatase 39 - 117 U/L 62 90 72  Total Bilirubin 0.2 - 1.2 mg/dL 0.8 0.5 0.6  Bilirubin, Direct 0.0 - 0.3 mg/dL - - -     CBC Latest Ref Rng 04/17/2014 01/24/2013  01/15/2013  WBC 4.0 - 10.5 K/uL 7.3 6.1 -  Hemoglobin  13.0 - 17.0 g/dL 14.9 15.5 15.3  Hematocrit 39.0 - 52.0 % 45.4 46.1 45.0  Platelets 150 - 400 K/uL 159 165 -   Lab Results  Component Value Date   MCV 83.6 04/17/2014   MCV 77.5* 01/24/2013   MCV 78.5 10/04/2012    Lipid Panel     Component Value Date/Time   CHOL 152 07/18/2014 1056   TRIG 86 07/18/2014 1056   HDL 30* 07/18/2014 1056   CHOLHDL 5.1 07/18/2014 1056   VLDL 17 07/18/2014 1056   LDLCALC 105* 07/18/2014 1056     RADIOLOGY: No results found.    ASSESSMENT AND PLAN: Terry Parks is a 60 year old African-American male who is morbidly obese with a BMI of 41 kg/m, and has a history of hypertension, hyperlipidemia, and previous mild LV dysfunction noted in 2013 with an echo showing 40-45% ejection fraction which had normalized in May 2016.  He has documented severe LVH and severe left atrial dilatation.  In 2009 he was found to have very severe obstructive sleep apnea and initially was on CPAP therapy for several years but has not used this over the past several years.  He  tried to reinstitute therapy since June 2016 and has not had any supplies and continues to have old equipment.  I reviewed his most recent sleep study with him in detail.  On the short diagnostic portion of the study.  Severe sleep apnea was again confirmed.  He continues to have excessive daytime sleepiness despite using his old CPAP unit.  We are trying to contact several different DME supply companies who would be willing to work with him with reference to having him obtain new equipment and particularly new supplies.  I have also given him a prescription for Nuvigil at 150 mg to see if this can improve some of his significant residual daytime sleepiness and hypersomnolence.  His resting pulse today is in the upper 80s to 90s.  He is a very large gentleman and I suggested slight additional titration of his carvedilol to 37.5 mg twice a day.  He is  no longer taking Lasix daily but is taking this on an as-needed basis and  Presently there are no signs of overt CHF.  When  does get a new machine  I will need to see him back in sleep clinic to document compliance.  Hopefully, he will be eligible to obtain new supplies and new machine to treat his severe obstructive sleep apnea as soon as possible.  Time spent: 25 minutes  Troy Sine, MD, [facility]  06/16/2015 6:06 PM

## 2015-06-17 ENCOUNTER — Encounter: Payer: Self-pay | Admitting: Dietician

## 2015-06-17 ENCOUNTER — Telehealth: Payer: Self-pay | Admitting: Dietician

## 2015-06-17 NOTE — Telephone Encounter (Signed)
Patient called and discussed that his eye exam showed retinopathy and recommendation to follow u7p in 6 months. He agreed to schedule that appointment. CDE encouraged him to reduce his a1C to below 7%. Also reminded him of his appointments this Friday.

## 2015-06-17 NOTE — Telephone Encounter (Signed)
Thank you for the heads up!

## 2015-06-19 ENCOUNTER — Other Ambulatory Visit: Payer: Self-pay | Admitting: Internal Medicine

## 2015-06-20 ENCOUNTER — Encounter: Payer: Self-pay | Admitting: Dietician

## 2015-06-20 ENCOUNTER — Encounter: Payer: Self-pay | Admitting: Internal Medicine

## 2015-06-20 ENCOUNTER — Ambulatory Visit (INDEPENDENT_AMBULATORY_CARE_PROVIDER_SITE_OTHER): Payer: Self-pay | Admitting: Internal Medicine

## 2015-06-20 ENCOUNTER — Telehealth: Payer: Self-pay | Admitting: Dietician

## 2015-06-20 ENCOUNTER — Ambulatory Visit (INDEPENDENT_AMBULATORY_CARE_PROVIDER_SITE_OTHER): Payer: Self-pay | Admitting: Dietician

## 2015-06-20 VITALS — BP 151/94 | HR 98 | Temp 98.0°F | Ht 73.0 in | Wt 302.7 lb

## 2015-06-20 DIAGNOSIS — I5042 Chronic combined systolic (congestive) and diastolic (congestive) heart failure: Secondary | ICD-10-CM

## 2015-06-20 DIAGNOSIS — E1142 Type 2 diabetes mellitus with diabetic polyneuropathy: Secondary | ICD-10-CM

## 2015-06-20 DIAGNOSIS — Z794 Long term (current) use of insulin: Secondary | ICD-10-CM

## 2015-06-20 DIAGNOSIS — E1165 Type 2 diabetes mellitus with hyperglycemia: Secondary | ICD-10-CM

## 2015-06-20 DIAGNOSIS — G4733 Obstructive sleep apnea (adult) (pediatric): Secondary | ICD-10-CM

## 2015-06-20 DIAGNOSIS — Z9119 Patient's noncompliance with other medical treatment and regimen: Secondary | ICD-10-CM

## 2015-06-20 DIAGNOSIS — Z713 Dietary counseling and surveillance: Secondary | ICD-10-CM

## 2015-06-20 DIAGNOSIS — Z7984 Long term (current) use of oral hypoglycemic drugs: Secondary | ICD-10-CM

## 2015-06-20 LAB — GLUCOSE, CAPILLARY: GLUCOSE-CAPILLARY: 289 mg/dL — AB (ref 65–99)

## 2015-06-20 MED ORDER — INSULIN GLARGINE 100 UNIT/ML SOLOSTAR PEN: 44.0000 [IU] | PEN_INJECTOR | Freq: Every day | SUBCUTANEOUS | Status: DC

## 2015-06-20 NOTE — Telephone Encounter (Signed)
Per [place] map program: they are waiting for his application to be approved. He Signed it and they sent it in 3-4 weeks ago.  The gave him 2 pens on 06/12/15 they told him to come back for more when he has 2 days left. I see in my last note from 05/22/15 that I mailed him a voucher and a prescription for a box of 5 pens. He was not aware of this being mailed to him.  He was given another voucher today for 5 pens and a prescription for same.

## 2015-06-20 NOTE — Patient Instructions (Addendum)
For your diabetes, we will try to get you a sample of Lantus. Please start taking Lantus 44 units at bedtime and bring her glucometer with you next time so we can see how you're doing.  For your heart failure, please by scale. Sure unable to do this, please let us know. Please resume Lasix 40 mg daily.  For your sleep apnea, we will let Dr. Evette Georges office no about the difficulties purchasing this medication so they can figure out what best to give you.   Please see [place] back in 1 month.

## 2015-06-20 NOTE — Progress Notes (Signed)
  Medical Nutrition Therapy:  Appt start time: 1500 end time:  [id number]. Visit # 2 this year, but patient well known to CDE  Assessment:  Primary concerns today: high blood sugars and sleep.  Patient reports he took his insulin some of the time over the past month, but forgets because he keeps separate from his other medicine. He also seemed to have problems getting it. He asked about how the lantus works.      Support:  Has supportive church members ANTHROPOMETRICS: weight-297#, BMI-39  SLEEP:not discussed today MEDICATIONS:  feet numb and very painful. He did not get the voucher that was mailed and is still having problems getting his lantus from [facility]. BLOOD SUGAR:he did not bring meter in . DIETARY INTAKE: Usual eating pattern includes 3 meals and 3 snacks per day. Eating more vegetables likes salad with chicken on it. Large amounts of fruit .   24-hr recall not done today Beverages: trying to cut down on pepsi and sweet tea and drink more diet drinks and water, hot tea and milk    Usual physical activity: walks for adls but no formal exercsie  Estimated daily energy needs: 2500 (wt loss)-3000 calories 280-330 g carbohydrates   Progress Towards Goal(s):  In progress.   Nutritional Diagnosis:  [place]-2.2 Altered nutrition-related laboratory As related to lack of sleep, increased intake of soda and sweets and no insulin for 1.5 months.  As evidenced by his report and his A1C of >14 and CBG of 330.    Intervention:  Nutrition education about action of lantus, importance of spreading food intake out over the day with balanced meals and snacks. Coordination of care: suggest  Transitioning him to diabetes medicines that encourage weight loss and do not cause low blood sugar to minimize need for self monitoring if able  Teaching Method Utilized: Visual, Auditory,hands on Handouts given during visit include: Barriers to learning/adherence to lifestyle change: lack of sleep, lack of  finances Demonstrated degree of understanding via:  Teach Back   Monitoring/Evaluation:  Dietary intake, exercise, meter, and body weight in 4 week(s).

## 2015-06-20 NOTE — Assessment & Plan Note (Addendum)
Overview Irritable following his last office visit, he was seen by Ms. [name] Plyler on 05/22/15 for diabetes at which time he noted drinking soft drinks to help him stay awake while being off insulin.   Since last time, he reports cutting back on how much soft drinks he consumed though had one last night because it helps him with his gas. He no longer acknowledges fear with needles and would like to go back to taking his Lantus 44 units in the morning. He is currently going through the medication assistance program process to procure these medications at an affordable cost to him since he is uninsured. He has forgotten his glucometer today though reports his sugars trended mostly in the 200s to 300s. He does feel he is using the restroom more frequently than he should when his sugars do run high as in the 500s though reports that he no longer has the "HIGH" readings on his glucometer like he did at his last office visit. He also reports taking metformin 1000 mg twice daily  He also underwent an eye exam which showed retinopathy and would need follow-up in 6 months.   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 Adherence is a significant issue given that cost limits him.  Plan -Continue Lantus 44 units in the morning and metformin 1000 mg twice daily -Provided patient with a prescription for 5 Lantus all-star pens along with a coupon for which he could receive insulin therapy for free until his paperwork is processed -Consider starting SLGT2 inhibitor at follow-up visit in one month given his cardiac comorbidities. Given that he does not have coverage, we'll need to figure out how to get this medication for him though it should be available through medication assistance from Health Department. -Follow-up in 1 month for reassessment

## 2015-06-20 NOTE — Telephone Encounter (Signed)
Waiting to hear back about why patient not getting Lantus through [facility]. They do have patient assistance for both Invokanna and Empagliflozin. There is also a combination medicine of januvia and empagliflozin that is likely available as well.

## 2015-06-20 NOTE — Assessment & Plan Note (Signed)
Overview His weight today is 302 pounds. He does not have a scale at home and is not feel dizzy at home. He has not been taking Lasix 40 mg for the past week per the instructions of his cardiologist. He notes swelling in his legs and tightness of his feet..  Most recent echo 06/16/15 notable for EF 45% with grade 1 diastolic dysfunction.  Assessment Combined chronic systolic and diastolic congestive heart failure. Per review of cardiology notes, it appears hypertension is major cause as EF improved with medications though adherence is a significant challenge.  Plan -Advised patient that he needs to purchase a scale to monitor his dry weight to let us know if he is unable to do so so that we may find a way for him to purchase a scale. -Advised him to resume taking Lasix 40 mg daily -Plan to route note to patient's cardiologist for further management

## 2015-06-20 NOTE — Progress Notes (Signed)
   Subjective:    Patient ID: Terry Parks, male    DOB: 08-26-1955, 60 y.o.   MRN: [id number]  HPI Mr. Woitas is a 60 year old male with poorly controlled type 2 diabetes, obstructive sleep apnea, chronic combined systolic and diastolic heart failure, morbid obesity who presents today for diabetes. Please see assessment & plan for status of chronic medical problems.   Review of Systems  Respiratory: Negative for shortness of breath.   Cardiovascular: Positive for leg swelling. Negative for chest pain.  Gastrointestinal: Negative for nausea, vomiting, abdominal pain and diarrhea.  Neurological: Positive for weakness. Negative for dizziness.       Objective:   Physical Exam  Constitutional: He is oriented to person, place, and time. He appears well-developed and well-nourished. No distress.  Morbidly obese, middle-aged African-American male. Tired appearing  HENT:  Head: Normocephalic and atraumatic.  Eyes: Conjunctivae and EOM are normal. No scleral icterus.  Neck: Normal range of motion. Neck supple.  Cardiovascular: Normal rate, regular rhythm and normal heart sounds.  Exam reveals no gallop and no friction rub.   No murmur heard. Pulmonary/Chest: Effort normal and breath sounds normal. No respiratory distress. He has no wheezes. He has no rales. He exhibits no tenderness.  Musculoskeletal: Normal range of motion. He exhibits edema (1+ pitting edema bilateral lower extremities).  Neurological: He is alert and oriented to person, place, and time.  Skin: Skin is warm and dry. He is not diaphoretic.  Psychiatric: He has a normal mood and affect. His behavior is normal.          Assessment & Plan:

## 2015-06-20 NOTE — Assessment & Plan Note (Addendum)
Overview He underwent follow-up sleep study on 06/05/15. He was seen on 06/16/15 by Dr. Claiborne Billings who is also working with other companies to obtain his CPAP supplies. He was started on Nuvigil 150mg  to see if it improved his daytime sleepiness.   Today, he reports he has been unable to pick up Nuvigil given that it is a controlled substance, and the [place] is unable to provide this medication to him.  Assessment Poorly controlled obstructive sleep apnea due to financial barriers with obtaining necessary supplies and equipment.  Plan Contact his sleep medicine doctor's office

## 2015-06-22 NOTE — Progress Notes (Signed)
Pamala Hurry, please ask him to increase furosemide to 80 mg daily and ask him if he has acquired a scale for home.

## 2015-06-24 NOTE — Addendum Note (Signed)
Addended by: Truddie Crumble on: 06/24/2015 10:26 AM   Modules accepted: Orders

## 2015-06-24 NOTE — Addendum Note (Signed)
Addended by: Truddie Crumble on: 06/24/2015 10:25 AM   Modules accepted: Orders

## 2015-06-24 NOTE — Progress Notes (Signed)
Thank you.  Will reach out to the [facility] for possible assistance with scale.

## 2015-06-25 ENCOUNTER — Telehealth: Payer: Self-pay | Admitting: *Deleted

## 2015-06-25 ENCOUNTER — Telehealth: Payer: Self-pay | Admitting: Licensed Clinical Social Worker

## 2015-06-25 ENCOUNTER — Encounter: Payer: Self-pay | Admitting: Licensed Clinical Social Worker

## 2015-06-25 DIAGNOSIS — I5042 Chronic combined systolic (congestive) and diastolic (congestive) heart failure: Secondary | ICD-10-CM

## 2015-06-25 MED ORDER — FUROSEMIDE 40 MG PO TABS: 80.0000 mg | ORAL_TABLET | Freq: Every day | ORAL | Status: DC

## 2015-06-25 NOTE — Progress Notes (Signed)
Patient notified to increase furosemide to 80 mg daily and start weighing daily and keep a log.  Patient voiced understanding.

## 2015-06-25 NOTE — Addendum Note (Signed)
Addended by: Lalla Brothers T on: 06/25/2015 10:19 AM   Modules accepted: Level of Service

## 2015-06-25 NOTE — Telephone Encounter (Signed)
-----   Message from [name], MD sent at 06/22/2015  8:45 AM EST -----   ----- Message -----    From: Riccardo Dubin, MD    Sent: 06/21/2015   4:43 PM      To: [name], MD

## 2015-06-25 NOTE — Progress Notes (Signed)
A user error has taken place: encounter opened in error, closed for administrative reasons.

## 2015-06-25 NOTE — Telephone Encounter (Signed)
Patient notified to increase furosemide to 80 mg daily and start weighing daily and keep a log.  Patient voiced understanding.

## 2015-06-25 NOTE — Progress Notes (Signed)
Internal Medicine Clinic Attending  Case discussed with Dr. Patel,Rushil soon after the resident saw the patient.  We reviewed the resident's history and exam and pertinent patient test results.  I agree with the assessment, diagnosis, and plan of care documented in the resident's note. 

## 2015-06-25 NOTE — Telephone Encounter (Signed)
Terry Parks was referred to CSW by CDE as pt is unable to weigh himself because he does not have a scale.  Pt is uninsured and unable to purchase scale.  Pt in need of scale able to accommodate weight over 300lbs.  Terry Parks has been dx with CHF.  CSW reached out to [facility] CHF clinic to inquire if pt would be able to receive assistance with obtaining scale.  CHF clinic able to assist.  [facility] will notify this worker once scale is received.  CSW placed call to Terry Parks pt confirmed he had not received a scale and financially unable to purchase.  CSW informed Terry Parks of scale available through [facility].  Pt states he would be able to come to [facility] to obtain scale if scale received before his scheduled [facility] appointment on 07/21/15.  CSW will contact Terry Parks once CSW in receipt of scale.

## 2015-06-26 ENCOUNTER — Telehealth: Payer: Self-pay | Admitting: Cardiovascular Disease

## 2015-06-26 NOTE — Telephone Encounter (Signed)
Left msg for Clarene Critchley at [facility] - related to dispense amount/dose of medication for patient.  msg left w/ instructions for new dose.

## 2015-06-26 NOTE — Telephone Encounter (Signed)
Returned call. Asked if she had checked msg I left, she had not. New furosemide dose clarified. Understanding verbalized.

## 2015-06-26 NOTE — Telephone Encounter (Signed)
New message   Clarene Critchley is calling to get information on the dose of pt medication  Lasix 60mg  and the dose is for 80mg      Pt c/o medication issue:  1. Name of Medication: lasix 2. How are you currently taking this medication (dosage and times per day)? 60mg  or 80mg  3. Are you having a reaction (difficulty breathing--STAT)? unknown  4. What is your medication issue? Pt dose has changed from 60mg  to 80mg  is the 80mg  the correct amount?

## 2015-06-26 NOTE — Telephone Encounter (Signed)
F/u  Terry Parks stated she still needed to clarify dosage of lasix from previous note. Please call back and discuss.

## 2015-06-30 MED ORDER — EMPAGLIFLOZIN 10 MG PO TABS: 10.0000 mg | ORAL_TABLET | Freq: Every day | ORAL | Status: DC

## 2015-06-30 NOTE — Telephone Encounter (Signed)
Protocol conversations last week, I feel comfortable starting therapy with empagliflozin 10 mg daily. I think this will benefit him given his comorbid heart failure and is a non-injectable.  I have sent over prescription for 30 tablets to see how well he tolerates his medication. Can you please call the patient to let him know we will be starting this therapy?  Thank you.

## 2015-06-30 NOTE — Addendum Note (Signed)
Addended by: Riccardo Dubin on: 06/30/2015 11:59 AM   Modules accepted: Orders

## 2015-07-02 ENCOUNTER — Telehealth: Payer: Self-pay | Admitting: Dietician

## 2015-07-02 NOTE — Telephone Encounter (Signed)
Called an informed patient that new medicine (jardiance) for his diabetes had been sent to the [facility]. Also educated him how it works and to call if he starts it and has any blood sugar lower than 70 as his lantus may need to be adjusted. He also acknowledged receipt of second lantus voucher in mail.

## 2015-07-08 NOTE — Progress Notes (Signed)
Patient was seen in the office. He has no insurance. [facility] is going to give the patient a S-9 machine. Okay per Dr Claiborne Billings for the patient to use this machine for therapy.  Information relayed to [place]. She will work out the patient's therapy with him.

## 2015-07-18 ENCOUNTER — Telehealth: Payer: Self-pay | Admitting: Licensed Clinical Social Worker

## 2015-07-18 NOTE — Telephone Encounter (Signed)
CSW placed call to [name].  Pt notified scale has been obtained and is awaiting pick up from front office.  Pt thankful and states he will pick up scale on 07/21/15 during his scheduled [facility] appointment.  Scale left at front office.

## 2015-07-21 ENCOUNTER — Encounter: Payer: Self-pay | Admitting: Internal Medicine

## 2015-07-21 ENCOUNTER — Ambulatory Visit: Payer: Self-pay | Admitting: Dietician

## 2015-07-21 ENCOUNTER — Ambulatory Visit (INDEPENDENT_AMBULATORY_CARE_PROVIDER_SITE_OTHER): Payer: Self-pay | Admitting: Internal Medicine

## 2015-07-21 VITALS — BP 138/70 | HR 99 | Temp 97.7°F | Wt 302.2 lb

## 2015-07-21 DIAGNOSIS — E1142 Type 2 diabetes mellitus with diabetic polyneuropathy: Secondary | ICD-10-CM

## 2015-07-21 DIAGNOSIS — I10 Essential (primary) hypertension: Secondary | ICD-10-CM

## 2015-07-21 DIAGNOSIS — I5042 Chronic combined systolic (congestive) and diastolic (congestive) heart failure: Secondary | ICD-10-CM

## 2015-07-21 DIAGNOSIS — Z794 Long term (current) use of insulin: Secondary | ICD-10-CM

## 2015-07-21 DIAGNOSIS — I110 Hypertensive heart disease with heart failure: Secondary | ICD-10-CM

## 2015-07-21 DIAGNOSIS — E1165 Type 2 diabetes mellitus with hyperglycemia: Secondary | ICD-10-CM

## 2015-07-21 DIAGNOSIS — Z7984 Long term (current) use of oral hypoglycemic drugs: Secondary | ICD-10-CM

## 2015-07-21 LAB — GLUCOSE, CAPILLARY: Glucose-Capillary: 304 mg/dL — ABNORMAL HIGH (ref 65–99)

## 2015-07-21 LAB — POCT GLYCOSYLATED HEMOGLOBIN (HGB A1C): Hemoglobin A1C: >14

## 2015-07-21 MED ORDER — DULOXETINE HCL 60 MG PO CPEP: 60.0000 mg | ORAL_CAPSULE | Freq: Every day | ORAL | Status: DC

## 2015-07-21 MED ORDER — INSULIN GLARGINE 100 UNIT/ML SOLOSTAR PEN: 50.0000 [IU] | PEN_INJECTOR | Freq: Every day | SUBCUTANEOUS | Status: DC

## 2015-07-21 NOTE — Patient Instructions (Signed)
Make sure you are avoiding all sugary foods and sodas. You are in dangerous range for your diabetes. You must bring this under control.   Start taking Lantus 50 units daily. Keep checking your sugar 3-4 times a day.  Continue taking Jardiance and metformin along with this.  Take coreg 37.5 mg tablet twice a day.  Start taking cymbalta 60mg  daily for leg pain (this is from your diabetes causing nerve damage). So work on controlling your diabetes.  Follow up in 2 weeks with your meter.

## 2015-07-21 NOTE — Assessment & Plan Note (Addendum)
Weight same as last visit 302. Slight edema on shins. Continue same dose of lasix 80mg  daily.  Asked to keep leg elevated.

## 2015-07-21 NOTE — Assessment & Plan Note (Addendum)
hgba1c remains >14 similar to last time. Home glucose in 300-400's. Taking lantus 44 units. Not sure about Jardiance. Doing metformin 1000mg  bid. Eating lot of sugary food and sodas.  Had long discussion about dietary compliance with diabetic diet. Does not want to do meal time insulin (fear of needles). Will increase lantus to 50 units daily. Continue metformin 1000mg  bid and start taking Jardiance if he has not yet (he will check when he gets home).  F/up in 2 weeks with meter reading.  Changed gabapentin to cymbalta for neuropathic pain.

## 2015-07-21 NOTE — Progress Notes (Signed)
   Subjective:    Patient ID: Terry Parks, male    DOB: May 19, 1955, 60 y.o.   MRN: [id number]  HPI  60 yo male with poorly controlled DM II, OSA, chronic combined CHF, HTN presents today for follow up DM II and HTN.    DM II - Dr. Posey Pronto instruced to continue lantus 44 units + metformin 1000mg  BID. Was working on medication assistance at that time and was given coupon for lantus until he would get his medical assist processed. Last hgba1c 2 months ago was >14.0. Was also put on Jardiance (SGLT2 inhibitor) for cardiovascular benefit. Not sure if he's taking it. Home glucose has been 300 to 400's. Eating sugary food and also drinking sodas.  CHF - last echo EF 45% with grade 1 Diastolic dysfunction. Thought to be non ischemic, 2/2 to HTN. Has a scale now. Weight today 302.2, similar to last visit. Was instructed to take lasix 80mg  daily since last visit by cardiologist.  Denies any sob. Doing well.   HTN: on amlodipine 10mg  daily, coreg 37.5 BID (only taking 25mg  bid for now), olmesartan 40mg  daily.   Review of Systems  Constitutional: Negative for fever, chills and fatigue.  HENT: Negative for congestion and sore throat.   Eyes: Negative.   Respiratory: Negative for cough, chest tightness and shortness of breath.   Cardiovascular: Positive for leg swelling. Negative for chest pain and palpitations.  Gastrointestinal: Negative for abdominal pain and abdominal distention.  Endocrine: Negative.   Genitourinary: Negative for dysuria and difficulty urinating.  Musculoskeletal:       Pain on both feet.   Skin: Negative.   Neurological: Negative for dizziness and weakness.  Hematological: Negative.   Psychiatric/Behavioral: Negative for confusion and agitation.       Objective:   Physical Exam  Constitutional: He is oriented to person, place, and time. He appears well-developed and well-nourished.  Overweight male. Pleasant.  HENT:  Head: Normocephalic and atraumatic.  Eyes:  Conjunctivae are normal. Pupils are equal, round, and reactive to light.  Neck: Normal range of motion. No JVD present.  Cardiovascular: Normal rate and regular rhythm.  Exam reveals no friction rub.   No murmur heard. Pulmonary/Chest: Effort normal and breath sounds normal. No respiratory distress. He has no wheezes. He has no rales.  Abdominal: Soft. He exhibits no distension. There is no tenderness.  Musculoskeletal: He exhibits no edema or tenderness.  Trace edema upto shins b/l.  Neurological: He is alert and oriented to person, place, and time.     Filed Vitals:   07/21/15 1021  BP: 138/70  Pulse: 99  Temp: 97.7 F (36.5 C)        Assessment & Plan:  See problem based a&p.

## 2015-07-21 NOTE — Assessment & Plan Note (Signed)
Filed Vitals:   07/21/15 1021  BP: 138/70  Pulse: 99  Temp: 97.7 F (36.5 C)   Continue lasix 80mg  daily + amlodipine 10mg  + asked to start taking coreg 37.5 mg BID (not 25). If BP improves with higher dose of coreg, may consider lowering amlodipine dose to help with leg edema.

## 2015-07-24 NOTE — Progress Notes (Signed)
Internal Medicine Clinic Attending  Case discussed with Dr. Ahmed soon after the resident saw the patient.  We reviewed the resident's history and exam and pertinent patient test results.  I agree with the assessment, diagnosis, and plan of care documented in the resident's note. 

## 2015-07-30 ENCOUNTER — Encounter (HOSPITAL_BASED_OUTPATIENT_CLINIC_OR_DEPARTMENT_OTHER): Payer: Self-pay

## 2015-08-01 ENCOUNTER — Telehealth: Payer: Self-pay | Admitting: Internal Medicine

## 2015-08-01 NOTE — Telephone Encounter (Signed)
APPT. REMINDER CALL, LMTCB °

## 2015-08-04 ENCOUNTER — Ambulatory Visit: Payer: Self-pay | Admitting: Internal Medicine

## 2015-08-21 ENCOUNTER — Ambulatory Visit: Payer: Self-pay | Admitting: Cardiovascular Disease

## 2015-08-22 ENCOUNTER — Encounter: Payer: Self-pay | Admitting: *Deleted

## 2015-08-23 NOTE — Telephone Encounter (Signed)
Was he able to pick the scale up? In reviewing the office visit note from Dr. Genene Churn on 07/21/15, I cannot tell.

## 2015-08-25 NOTE — Telephone Encounter (Signed)
Yes, I hand delivered it to him during the appointment.

## 2015-08-26 ENCOUNTER — Telehealth: Payer: Self-pay | Admitting: Dietician

## 2015-08-26 NOTE — Telephone Encounter (Signed)
Received massage that patient got his scale. Called him to follow up: he is using the scale,  keeps it in the bathroom. His sleep is not good, still has to work on it. Machine needs to be adjusted. Gets up and tired and worse than when he went to sleep. Feet are swollen, numb, cold, he cannot feel his feet, this causes stumbling. Blood sugar are mostly in the 200s. He plans to call his sleep doctor soon, verbalizes understanding of importance of checking feet daily since his feeling seems to be decreased. Confirmed appointment with diabetes educator in June. Encouraged him to reschedule his missed appointment with his PCP or one of our doctors as well as his sleep doctor

## 2015-08-26 NOTE — Telephone Encounter (Signed)
If his sugars are in the 200s, it means his A1c is somewhat better than we last saw him. Thank you for the update. He does need to follow-up soon.

## 2015-09-10 ENCOUNTER — Encounter: Payer: Self-pay | Admitting: Cardiovascular Disease

## 2015-09-10 ENCOUNTER — Ambulatory Visit (INDEPENDENT_AMBULATORY_CARE_PROVIDER_SITE_OTHER): Payer: Self-pay | Admitting: Cardiovascular Disease

## 2015-09-10 VITALS — BP 146/102 | HR 106 | Ht 73.0 in | Wt 299.4 lb

## 2015-09-10 DIAGNOSIS — Z9989 Dependence on other enabling machines and devices: Secondary | ICD-10-CM

## 2015-09-10 DIAGNOSIS — I5042 Chronic combined systolic (congestive) and diastolic (congestive) heart failure: Secondary | ICD-10-CM

## 2015-09-10 DIAGNOSIS — I10 Essential (primary) hypertension: Secondary | ICD-10-CM

## 2015-09-10 DIAGNOSIS — Z79899 Other long term (current) drug therapy: Secondary | ICD-10-CM

## 2015-09-10 DIAGNOSIS — E785 Hyperlipidemia, unspecified: Secondary | ICD-10-CM

## 2015-09-10 DIAGNOSIS — I110 Hypertensive heart disease with heart failure: Secondary | ICD-10-CM

## 2015-09-10 DIAGNOSIS — G4733 Obstructive sleep apnea (adult) (pediatric): Secondary | ICD-10-CM

## 2015-09-10 MED ORDER — ISOSORBIDE MONONITRATE ER 30 MG PO TB24: 30.0000 mg | ORAL_TABLET | Freq: Every day | ORAL | Status: DC

## 2015-09-10 MED ORDER — SPIRONOLACTONE 25 MG PO TABS: 12.5000 mg | ORAL_TABLET | Freq: Every day | ORAL | Status: DC

## 2015-09-10 NOTE — Patient Instructions (Addendum)
Your physician has recommended you make the following change in your medication:   1.) start new prescription for spironolactone and isosorbide.  2.) increase the carvedilol as directed before ( 37.5 mg) 1 & 1/2 tablets of your 25 mg twice daily.  Your physician recommends that you return for lab work in 4-6 weeks.  Your physician recommends that you schedule a follow-up appointment in: 3 months.  We will call Anderson Malta with [facility] to have you to come in for a download on your CPAP machine.

## 2015-09-12 ENCOUNTER — Encounter: Payer: Self-pay | Admitting: Cardiovascular Disease

## 2015-09-12 NOTE — Progress Notes (Signed)
Patient ID: Terry Parks, male   DOB: [birth year], 60 y.o.   MRN: [id number]     HPI: Terry Parks is a 60 y.o. male with a history of severe obstructive sleep apnea.  I had seen him in October for sleep evaluation.  He presents for follow-up evaluation following getting a new  CPAP unit from [facility]  Terry Parks has a history of hypertension, hyperlipidemia, morbid obesity and is felt to have functional class II exertional dyspnea.  In 2009 he was diagnosed with severe obstructive sleep apnea and had an AHI of 108.5 per hour.  His REM AHI was 105.  He was titrated up to a CPAP pressure of 13 cm water pressure and utilized a [employer] full face mask.  He is unemployed and has an orange card.  He has not used CPAP for years and previously had used [employer] as a DME company who in the past had accepted his orange card from [facility].  He saw Dr. Sallyanne Kuster in June and at that time, it became apparent that he had not been utilizing CPAP therapy.  The patient tried to resume using his CPAP.  However, [facility] would no longer except his orange card; and  his supplies  are 58 years old.  He had been trying to use his mask which does not fit and he falls asleep holding the mask to his face.  Despite this suboptimal therapy, he has noted some improvement in how he feels with reference to fatigability, hypersomnolence.  Previously, his Epworth Sleepiness Scale score had been 18.  He did not quantify the Epworth Sleepiness scale score when I saw him, but checked that he still had a significant chance of dozing sitting and reading, sitting inactive in a public place, sitting as a passenger in a car for an hour without a break, sitting and talking with someone, sitting quietly after lunch, and in the car stopped for a few minutes in traffic.  He does snore.  He cannot sleep on his back.  He admits to fatigability.  He denies restless legs.  He denies hypogognic hallucinations.  He  denies cataplectic events.  Since I last saw him, he underwent a follow-up sleep study at [facility] on 06/05/2015.  This was a split-night protocol. He again was found to have severe obstructive sleep apnea during the diagnostic portion of the study with an AHI of48.5 per hour, and 58.3 per hour with rim sleep.  He has significant oxygen desaturation to 82%.  He was titrated up to 11 cm water pressure with heated communication.  He presents for reevaluation.  When I last saw him an Epworth sleepiness scale score endorsed at 21.  He had continued to use his is very old CPAP unit without recent supplies.  Since I saw him, he was able to obtain an S9 Elite ResMed CPAP unit from [place].  This is much more recent than his prior equipment but is not the most recent unit.  He received this 2 1/2 months ago.  He has not had a download.  He believes he is sleeping better but is still sleepy and has residual daytime sleepiness.  He is not taking Nuvigil or Provigil.  He admits to experiencing mild episodes of vague chest pain and shortness of breath with walking.  He denies PND, orthopnea.  An echo Doppler study on 06/16/2015 showed an EF of 40-45% with mild LVH and grade 1 diastolic dysfunction.  He presents for follow-up evaluation.  Past Medical History  Diagnosis Date  . Diabetes mellitus 04/08/2008  . Hypertension   . Hyperlipemia   . Obstructive sleep apnea 03/06/2008    Sleep study 03/06/08 showed severe OSA/hypopnea syndrome, with successful CPAP titration to 13 CWP using a medium ResMed Mirage Quattro full face mask with heated humidifier.   . Cardiomegaly     Mild by CXR 11/04/04  . BPH (benign prostatic hypertrophy)     Massive BPH noted on cystoscopy 1/23/ 2012 by Dr. Risa Grill.  . Renal calculus 05/29/2010    CT scan of abdomen/pelvis on 05/29/2010 showed an obstructing approximate 1-2 mm calculus at the left UVJ, and an approximate 1-2 mm left lower pole renal  calculus.   Patient had continuing severe pain , and an elevation of his serum creatinine to a value of 1.75 on 06/06/2010.  The stone had apparently passed and was not seen on repeat CT 06/08/2010.  [name] Headache(784.0)   . Allergic rhinitis   . Neck pain   . Abscess of groin   . Abscessed tooth   . Hypertensive cardiopathy 03/01/2006    2-D echocardiogram 02/01/2012 showed moderate LVH, mildly to moderately reduced left ventricular systolic function with an estimated ejection fraction of 40-45%, and diffuse hypokinesis.  A nuclear medicine stress study done 01/31/2012 showed no reversible ischemia, a small mid anterior wall fixed defect/infarct, and ejection fraction 42%.        Past Surgical History  Procedure Laterality Date  . Cystoscopy w/ retrogrades      Allergies  Allergen Reactions  . Lisinopril Cough    Current Outpatient Prescriptions  Medication Sig Dispense Refill  . amLODipine (NORVASC) 10 MG tablet Take 1 tablet (10 mg total) by mouth daily. 90 tablet 3  . Armodafinil (NUVIGIL) 150 MG tablet Take 1 tablet (150 mg total) by mouth daily. As needed. 30 tablet 5  . aspirin 81 MG EC tablet Take 1 tablet (81 mg total) by mouth daily. 30 tablet 6  . carvedilol (COREG) 25 MG tablet Take 1.5 tablets (37.5 mg total) by mouth 2 (two) times daily. 60 tablet 6  . empagliflozin (JARDIANCE) 10 MG TABS tablet Take 10 mg by mouth daily. 30 tablet 0  . furosemide (LASIX) 40 MG tablet Take 2 tablets (80 mg total) by mouth daily. 180 tablet 3  . gabapentin (NEURONTIN) 300 MG capsule Take 600 mg by mouth daily.    [name] glucose blood (TRUETRACK TEST) test strip Use as directed to test blood sugar three times a day before meals. 100 each 1  . Insulin Glargine (LANTUS SOLOSTAR) 100 UNIT/ML Solostar Pen Inject 50 Units into the skin daily. 15 mL 0  . Ipratropium-Albuterol (COMBIVENT) 20-100 MCG/ACT AERS respimat Inhale 1 puff into the lungs every 6 (six) hours as needed for wheezing or shortness of  breath. 4 g 6  . Lancets MISC Use to test blood sugar three times a day before meals. 100 each 1  . metFORMIN (GLUCOPHAGE) 1000 MG tablet Take 1 tablet (1,000 mg total) by mouth 2 (two) times daily with a meal. 60 tablet 11  . olmesartan (BENICAR) 40 MG tablet Take 1 tablet (40 mg total) by mouth daily. 90 tablet 3  . sildenafil (VIAGRA) 50 MG tablet Take 1 tablet (50 mg total) by mouth daily as needed for erectile dysfunction (Take 1 hour before sexual activity; do not take more than one dose in a 24 hour period.). 10 tablet 0  . atorvastatin (  LIPITOR) 40 MG tablet Take 1 tablet (40 mg total) by mouth daily. 30 tablet 3  . DULoxetine (CYMBALTA) 60 MG capsule Take 1 capsule (60 mg total) by mouth daily. 30 capsule 2  . isosorbide mononitrate (IMDUR) 30 MG 24 hr tablet Take 1 tablet (30 mg total) by mouth daily. 30 tablet 6  . spironolactone (ALDACTONE) 25 MG tablet Take 0.5 tablets (12.5 mg total) by mouth daily. 30 tablet 3  . [DISCONTINUED] albuterol (PROVENTIL HFA;VENTOLIN HFA) 108 (90 BASE) MCG/ACT inhaler Inhale 1-2 puffs into the lungs every 6 (six) hours as needed for wheezing or shortness of breath.    . [DISCONTINUED] chlorpheniramine (CHLOR-TRIMETON) 4 MG tablet Take 1 tablet (4 mg total) by mouth 2 (two) times daily as needed for allergies. 30 tablet 0   No current facility-administered medications for this visit.    Social History   Social History  . Marital Status: Single    Spouse Name: N/A  . Number of Children: 4 b  . Years of Education: 16   Occupational History  .        Social History Main Topics  . Smoking status: Never Smoker   . Smokeless tobacco: Never Used  . Alcohol Use: No  . Drug Use: No  . Sexual Activity: Not Currently   Other Topics Concern  . Not on file   Social History Narrative   Divorced, 4 children, lives alone.  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    Family History  Problem Relation Age of Onset  . Breast cancer Mother   . Colon cancer  Neg Hx   . Prostate cancer Neg Hx   . Heart attack Neg Hx   . Hypertension Father   . Diabetes Maternal Grandmother      ROS General: Negative; No fevers, chills, or night sweats; positive for morbid obesity HEENT: Occasional changes in vision; no changes in hearing, sinus congestion, difficulty swallowing Pulmonary: Negative; No cough, wheezing, shortness of breath, hemoptysis Cardiovascular: Negative; No chest pain, presyncope, syncope, palpatations GI: Negative; No nausea, vomiting, diarrhea, or abdominal pain GU: Negative; No dysuria, hematuria, or difficulty voiding Musculoskeletal: Negative; no myalgias, joint pain, or weakness Hematologic: Negative; no easy bruising, bleeding Endocrine: Negative; no heat/cold intolerance Neuro: Negative; no changes in balance, headaches Skin: [place] of occasional dry skin Psychiatric: Negative; No behavioral problems, depression Sleep: see HPI   Physical Exam BP 146/102 mmHg  Pulse 106  Ht '6\' 1"'  (1.854 m)  Wt 299 lb 6 oz (135.796 kg)  BMI 39.51 kg/m2  Wt Readings from Last 3 Encounters:  09/10/15 299 lb 6 oz (135.796 kg)  07/21/15 302 lb 3.2 oz (137.077 kg)  06/20/15 302 lb 11.2 oz (137.304 kg)   General: Alert, oriented, no distress.  Skin: normal turgor, no rashes HEENT: Normocephalic, atraumatic. Pupils round and reactive; sclera anicteric; extraocular muscles intact; Fundi without hemorrhages or exudates Nose without nasal septal hypertrophy Mouth/Parynx:  Prominent mucoal soft tissue bilaterally on his inner cheeks.  Mallinpatti scale 3/4 Neck: No JVD, no carotid briuts Lungs: clear to ausculatation and percussion; no wheezing or rales  Chest wall: No tenderness to palpation Heart: RRR, s1 s2 normal; faint 1/6 systolic murmur.  No S3 or S4 gallop.  No rubs thrills or heaves. Abdomen: Central adiposity; soft, nontender; no hepatosplenomehaly, BS+; abdominal aorta nontender and not dilated by palpation. Back: No CVA  tenderness Pulses 2+ Extremities: no clubbinbg cyanosis or edema, Homan's sign negative  Neurologic: grossly nonfocal; cranial nerves intact. Psychological: Normal  affect and mood.   LABS:  BMP Latest Ref Rng 04/29/2015 02/18/2015 07/18/2014  Glucose 65 - 99 mg/dL 485(H) 158(H) 153(H)  BUN 6 - 20 mg/dL <5(L) 8 15  Creatinine 0.61 - 1.24 mg/dL 1.15 1.07 1.08  BUN/Creat Ratio 9 - 20 - 7(L) -  Sodium 135 - 145 mmol/L 135 142 137  Potassium 3.5 - 5.1 mmol/L 4.1 4.1 3.9  Chloride 101 - 111 mmol/L 100(L) 99 103  CO2 22 - 32 mmol/L '28 24 22  ' Calcium 8.9 - 10.3 mg/dL 9.2 8.9 9.0     Hepatic Function Latest Ref Rng 07/18/2014 04/17/2014 01/24/2013  Total Protein 6.0 - 8.3 g/dL 6.5 7.7 6.8  Albumin 3.5 - 5.2 g/dL 3.8 3.9 3.8  AST 0 - 37 U/L '25 12 19  ' ALT 0 - 53 U/L '24 12 16  ' Alk Phosphatase 39 - 117 U/L 62 90 72  Total Bilirubin 0.2 - 1.2 mg/dL 0.8 0.5 0.6     CBC Latest Ref Rng 04/17/2014 01/24/2013 01/15/2013  WBC 4.0 - 10.5 K/uL 7.3 6.1 -  Hemoglobin 13.0 - 17.0 g/dL 14.9 15.5 15.3  Hematocrit 39.0 - 52.0 % 45.4 46.1 45.0  Platelets 150 - 400 K/uL 159 165 -   Lab Results  Component Value Date   MCV 83.6 04/17/2014   MCV 77.5* 01/24/2013   MCV 78.5 10/04/2012    Lipid Panel     Component Value Date/Time   CHOL 152 07/18/2014 1056   TRIG 86 07/18/2014 1056   HDL 30* 07/18/2014 1056   CHOLHDL 5.1 07/18/2014 1056   VLDL 17 07/18/2014 1056   LDLCALC 105* 07/18/2014 1056     RADIOLOGY: No results found.    ASSESSMENT AND PLAN: Terry Parks is a 59 year old African-American male who has a history of morbid obesity,  hypertension, hyperlipidemia, and previous mild LV dysfunction noted in 2013 with an echo showing 40-45% ejection fraction which had normalized in May 2016.  He has documented severe LVH and severe left atrial dilatation.  In 2009 he was found to have very severe obstructive sleep apnea and initially was on CPAP therapy for several years but has not used  this over the past several years.  He  tried to reinstitute therapy since June 2016 and has not had any supplies and continues to have old equipment. His most recent sleep study again confirms severe sleep apnea.  I will obtain a download from his 9 a leak CPAP unit to make certain he is properly treated and adjustments do not need to be made.  He continues to have significant hypertension today.  His ECG demonstrates sinus tachycardia at 106 despite taking carvedilol.  He never increase this as recommended to 37.5 mg twice a day, and I again recommended he do this. I have suggested a trial of low-dose isosorbide mononitrate with his episode of vague chest pain and exertional dyspnea and also adding spironolactone 12.5 mg daily to his medical regimen for aldosterone blockade.  Weight loss was encouraged.  I will review his download when available.  I will see him in 3 months for reevaluation. Time spent: 25 minutes  Troy Sine, MD, [facility]  09/12/2015 8:51 PM

## 2015-10-06 ENCOUNTER — Telehealth: Payer: Self-pay | Admitting: Internal Medicine

## 2015-10-06 NOTE — Telephone Encounter (Signed)
APT. REMINDER CALL, LMTCB °

## 2015-10-07 ENCOUNTER — Encounter: Payer: Self-pay | Admitting: Internal Medicine

## 2015-10-07 ENCOUNTER — Ambulatory Visit (INDEPENDENT_AMBULATORY_CARE_PROVIDER_SITE_OTHER): Payer: Self-pay | Admitting: Internal Medicine

## 2015-10-07 VITALS — BP 172/103 | HR 113 | Temp 97.6°F | Ht 73.0 in | Wt 297.0 lb

## 2015-10-07 DIAGNOSIS — Z794 Long term (current) use of insulin: Secondary | ICD-10-CM

## 2015-10-07 DIAGNOSIS — E785 Hyperlipidemia, unspecified: Secondary | ICD-10-CM

## 2015-10-07 DIAGNOSIS — E1140 Type 2 diabetes mellitus with diabetic neuropathy, unspecified: Secondary | ICD-10-CM | POA: Insufficient documentation

## 2015-10-07 DIAGNOSIS — E1142 Type 2 diabetes mellitus with diabetic polyneuropathy: Secondary | ICD-10-CM

## 2015-10-07 DIAGNOSIS — Z79899 Other long term (current) drug therapy: Secondary | ICD-10-CM

## 2015-10-07 DIAGNOSIS — E784 Other hyperlipidemia: Secondary | ICD-10-CM

## 2015-10-07 DIAGNOSIS — I5042 Chronic combined systolic (congestive) and diastolic (congestive) heart failure: Secondary | ICD-10-CM

## 2015-10-07 DIAGNOSIS — I10 Essential (primary) hypertension: Secondary | ICD-10-CM

## 2015-10-07 DIAGNOSIS — E1159 Type 2 diabetes mellitus with other circulatory complications: Secondary | ICD-10-CM

## 2015-10-07 DIAGNOSIS — E1169 Type 2 diabetes mellitus with other specified complication: Secondary | ICD-10-CM

## 2015-10-07 DIAGNOSIS — I152 Hypertension secondary to endocrine disorders: Secondary | ICD-10-CM

## 2015-10-07 LAB — GLUCOSE, CAPILLARY: GLUCOSE-CAPILLARY: 230 mg/dL — AB (ref 65–99)

## 2015-10-07 LAB — POCT GLYCOSYLATED HEMOGLOBIN (HGB A1C): Hemoglobin A1C: 11.4

## 2015-10-07 MED ORDER — OLMESARTAN MEDOXOMIL 40 MG PO TABS: 40.0000 mg | ORAL_TABLET | Freq: Every day | ORAL | Status: DC

## 2015-10-07 MED ORDER — EMPAGLIFLOZIN 10 MG PO TABS: 10.0000 mg | ORAL_TABLET | Freq: Every day | ORAL | Status: DC

## 2015-10-07 MED ORDER — DULOXETINE HCL 60 MG PO CPEP: 60.0000 mg | ORAL_CAPSULE | Freq: Every day | ORAL | Status: DC

## 2015-10-07 MED ORDER — CARVEDILOL 25 MG PO TABS: 37.5000 mg | ORAL_TABLET | Freq: Two times a day (BID) | ORAL | Status: DC

## 2015-10-07 MED ORDER — ATORVASTATIN CALCIUM 40 MG PO TABS: 40.0000 mg | ORAL_TABLET | Freq: Every day | ORAL | Status: DC

## 2015-10-07 NOTE — Patient Instructions (Addendum)
For the foot pain, STOP gabapentin and TRY Cymbalta.  For the blood pressure, I updated the prescription for olmesartan, carvedilol.   Please see me back next month so we can keep working together on making change happen.

## 2015-10-07 NOTE — Assessment & Plan Note (Addendum)
Overview His blood pressure is elevated today in clinic 172/103. He has brought with him most of his medications which are documented in the EMR to several doses are inconsistent with what is listed in the bottle: Olmesartan 40 mg in our system but 20 mg on his bottle, carvedilol 37.5 mg twice daily but 6.25 mg noted on his bottle. Per his last cardiology office visit, he was started on spironolactone 12.5 mg daily though this pill is not in his bag as well. He is also out of his isosorbide mononitrate 30 mg though reports that all the empty bottles are not truly reflective of his supply as some of his medications are in his pillbox which he uses to help keep track of his medications. He does report cost sometimes limits his ability to pick up refills though gets financial assistance from his family.  Assessment Blood pressure elevated above goal less than 140/90 given comorbid diabetes and CHF complicated by nonadherence to medication due to cost and limited health literacy  Plan -Refilled olmesartan 40 mg daily along with carvedilol 37.5 mg twice daily with instructions to the pharmacy to cancel prior prescriptions -Encouraged him to continue taking spironolactone 12.5 mg daily and isosorbide mononitrate 30 mg daily -Check CMET today to assess electrolyte status given that he is on oral diuretic, SGLT 2 inhibitor, ARB therapy. He will probably need repeat lab work at his one-month follow-up is also on spironolactone. -Reached out to clinic pharmacy team to check with health department to ensure that there is consistency between our prescriptions and their records. We will also see what we can do to minimize his pill burden by combining medications.  ADDENDUM 10/14/2015  11:46 AM:  Per recommendations from Dr. Maudie Mercury, we will switch to amlodipine/olmesartan 10/40mg  daily and discontinue the separate medications of amlodipine and olmesartan.

## 2015-10-07 NOTE — Assessment & Plan Note (Signed)
Overview He finds the neuropathic pain in his feet to complicate his ability to ambulate during the day. He scores the pain 7-8/10 and feels gabapentin improves it minimally to 6/10 but would like to aim for 2/10 with a functional goal being able to ambulate without distress. His current dose of gabapentin was not working for him [600 mg 3 times daily] so he decided to take 300 mg capsules 5 twice daily but did not find much improvement in his symptoms as noted before. Duloxetine 60 mg daily was started as last visit though he never filled this medication.  Assessment Poorly controlled neuropathy in the setting of insulin-dependent type 2 diabetes with poor glycemic control as evident by A1c greater than 7  Plan -Discontinue gabapentin and start duloxetine 60 mg daily -Speak with clinic pharmacy team to see if this medication is available through medication assistance at reduced cost

## 2015-10-07 NOTE — Progress Notes (Signed)
   Subjective:    Patient ID: Terry Parks, male    DOB: December 06, 1955, 60 y.o.   MRN: [id number]  HPI Terry Parks is a 60 year old male with poorly controlled type 2 diabetes, obstructive sleep apnea, chronic combined systolic and diastolic heart failure, morbid obesity who presents today for diabetes. Please see assessment & plan for status of chronic medical problems.   Review of Systems  Respiratory: Positive for shortness of breath.   Cardiovascular: Positive for chest pain.  Gastrointestinal: Positive for abdominal pain.  Endocrine: Positive for polyuria.  Neurological: Positive for dizziness and headaches.       Objective:   Physical Exam  Constitutional: He is oriented to person, place, and time. He appears well-developed and well-nourished. No distress.  Morbidly obese, middle-aged African-American male. Tired appearing  HENT:  Head: Normocephalic and atraumatic.  Eyes: Conjunctivae and EOM are normal. No scleral icterus.  Neck: Normal range of motion. Neck supple.  Cardiovascular: Normal rate, regular rhythm and normal heart sounds.  Exam reveals no gallop and no friction rub.   No murmur heard. Pulmonary/Chest: Effort normal and breath sounds normal. No respiratory distress. He has no wheezes. He has no rales. He exhibits no tenderness.  Neurological: He is alert and oriented to person, place, and time.  Skin: Skin is warm and dry. He is not diaphoretic.          Assessment & Plan:

## 2015-10-07 NOTE — Assessment & Plan Note (Addendum)
Overview He cannot recall where he last places glucometer and has not brought it with him today for review of his blood sugars. He does feel they have been trending in the 200s to 300s on metformin 1000 mg twice daily, empagliflozin 10 mg daily, Lantus 50-55 units at bedtime. He does report symptoms of hyperglycemia, like polyuria, polydipsia, though is unclear if this is related to his diuretic medications or elevated blood sugar.  Assessment Insulin-dependent type 2 diabetes, K by neuropathy currently on triple therapy with poor glycemic control given A1c greater than 7.0. Barriers for optimal glycemic control and have included nonadherence to medication and poor insight though I'm optimistic we can continue working together for improvement.  Plan -Continue empagliflozin 10 mg daily, metformin 1000 mg twice daily, Lantus 50-55 units at bedtime -Encouraged him to bring his blood sugar meter next month for review -Check A1c today  ADDENDUM 10/08/2015  3:51 PM:  A1c improved to 11.2 from prior. CMET unremarkable for for renal dysfunction. Potassium also within normal limits.

## 2015-10-07 NOTE — Assessment & Plan Note (Addendum)
Overview He does not have a bottle of atorvastatin with him, and review of the chart shows that this medication fell off almost a year ago.  Assessment Hyperlipidemia associated with type 2 diabetes not currently on statin therapy. Per 2013 AHA/ACC ASCVD risk calculator, 10-year risk is 36.7 % and 5.3% with optimal risk factors using lipid profile from March 2016 and today's blood pressure values. High intensity statin therapy is thus indicated.   Plan -Check baseline CMET, lipid profile -Refilled atorvastatin 40 mg daily and encouraged that he continue to take this medication  ADDENDUM 10/08/2015  3:51 PM:  Per 2013 AHA/ACC ASCVD risk calculator, 10-year risk is 40.3% and 5.3 % with optimal risk factors. High intensity statin therapy is thus indicated. CMET unremarkable for LFT abnormalities.

## 2015-10-08 LAB — LIPID PANEL
CHOLESTEROL TOTAL: 227 mg/dL — AB (ref 100–199)
Chol/HDL Ratio: 7.6 ratio units — ABNORMAL HIGH (ref 0.0–5.0)
HDL: 30 mg/dL — ABNORMAL LOW (ref >39)
LDL Calculated: 159 mg/dL — ABNORMAL HIGH (ref 0–99)
TRIGLYCERIDES: 188 mg/dL — AB (ref 0–149)
VLDL Cholesterol Cal: 38 mg/dL (ref 5–40)

## 2015-10-08 LAB — CMP14 + ANION GAP
A/G RATIO: 1.3 (ref 1.2–2.2)
ALBUMIN: 4.1 g/dL (ref 3.5–5.5)
ALT: 15 IU/L (ref 0–44)
AST: 15 IU/L (ref 0–40)
Alkaline Phosphatase: 91 IU/L (ref 39–117)
Anion Gap: 20 mmol/L — ABNORMAL HIGH (ref 10.0–18.0)
BUN / CREAT RATIO: 9 (ref 9–20)
BUN: 10 mg/dL (ref 6–24)
Bilirubin Total: 0.5 mg/dL (ref 0.0–1.2)
CO2: 22 mmol/L (ref 18–29)
Calcium: 9.2 mg/dL (ref 8.7–10.2)
Chloride: 97 mmol/L (ref 96–106)
Creatinine, Ser: 1.11 mg/dL (ref 0.76–1.27)
GFR, EST AFRICAN AMERICAN: 85 mL/min/{1.73_m2} (ref >59)
GFR, EST NON AFRICAN AMERICAN: 73 mL/min/{1.73_m2} (ref >59)
GLUCOSE: 229 mg/dL — AB (ref 65–99)
Globulin, Total: 3.1 g/dL (ref 1.5–4.5)
POTASSIUM: 4.2 mmol/L (ref 3.5–5.2)
SODIUM: 139 mmol/L (ref 134–144)
Total Protein: 7.2 g/dL (ref 6.0–8.5)

## 2015-10-08 NOTE — Progress Notes (Signed)
Internal Medicine Clinic Attending  Case discussed with Dr. Patel,Rushil at the time of the visit.  We reviewed the resident's history and exam and pertinent patient test results.  I agree with the assessment, diagnosis, and plan of care documented in the resident's note.  

## 2015-10-14 MED ORDER — AMLODIPINE-OLMESARTAN 10-40 MG PO TABS: 1.0000 | ORAL_TABLET | Freq: Every day | ORAL | Status: DC

## 2015-10-14 NOTE — Addendum Note (Signed)
Addended by: Riccardo Dubin on: 10/14/2015 11:47 AM   Modules accepted: Orders, Medications

## 2015-10-17 ENCOUNTER — Telehealth: Payer: Self-pay | Admitting: Pharmacist

## 2015-10-17 NOTE — Telephone Encounter (Signed)
Contacted patient to notify him of medication change per Dr. Posey Pronto. Notified patient to d/c amlodipine and olmesartan once he starts the Azor. Patient verbalized understanding and stated he will call me if any confusion.

## 2015-10-18 NOTE — Telephone Encounter (Signed)
Good thought about the carvedilol. We can consider that at follow-up to avoid making too many changes at once.

## 2015-10-20 ENCOUNTER — Ambulatory Visit: Payer: Self-pay | Admitting: Dietician

## 2015-10-31 ENCOUNTER — Telehealth: Payer: Self-pay | Admitting: Cardiovascular Disease

## 2015-10-31 NOTE — Telephone Encounter (Signed)
Returned call. Patient notes he could not get labwork at [facility]. He has an orange card from [facility] for assistance with his medical bills, and they wouldn't take it. However, he had recent labs at Dr. Serita Grit office (viewable on epic). Dr. Posey Pronto has reviewed recent labwork for lipids.  Pt calling mainly bc he is having an issue w his CPAP. States he recently got new equipment from [facility].  When they set up his replacement CPAP, his settings were lost and the device was set for default settings. Since that time, he's been unable to use. He's requesting we communicate recommendations for his CPAP settings to [facility].  Pt aware Dr. Evette Georges MA out of office - will request this be followed up on next week.  He voiced acknowledgment and thanks for the call.

## 2015-10-31 NOTE — Telephone Encounter (Signed)
Pt calling c/o cpap machine-problem with settings-also was to get blood work @ [facility] but they don't take the orange card so he didn't have it done-his pcp has done some if you want that- [phone]

## 2015-11-13 NOTE — Telephone Encounter (Signed)
Jennifer at [facility]. She will contact patient and discuss pressure changes and/or adjustments.

## 2015-11-14 ENCOUNTER — Encounter: Payer: Self-pay | Admitting: Internal Medicine

## 2015-11-25 ENCOUNTER — Other Ambulatory Visit: Payer: Self-pay | Admitting: Internal Medicine

## 2015-11-25 DIAGNOSIS — E0849 Diabetes mellitus due to underlying condition with other diabetic neurological complication: Secondary | ICD-10-CM

## 2015-11-25 MED ORDER — METFORMIN HCL 1000 MG PO TABS: 1000.0000 mg | ORAL_TABLET | Freq: Two times a day (BID) | ORAL | Status: DC

## 2015-11-25 NOTE — Telephone Encounter (Signed)
As this patient was seen by me and deemed necessary for their treatment, I will refill this prescription for metformin.

## 2015-11-25 NOTE — Telephone Encounter (Signed)
metFORMIN (GLUCOPHAGE) 1000 MG tablet Health [facility]

## 2015-11-27 ENCOUNTER — Telehealth: Payer: Self-pay | Admitting: *Deleted

## 2015-11-27 ENCOUNTER — Ambulatory Visit: Payer: Self-pay

## 2015-11-27 DIAGNOSIS — E0849 Diabetes mellitus due to underlying condition with other diabetic neurological complication: Secondary | ICD-10-CM

## 2015-11-27 NOTE — Telephone Encounter (Signed)
Spoke with [facility] department pharmacy who states they have the RX's but they were waiting for the patient to get his orange card. Notified patient that he can go to the pharmacy and pick up his medicines since he picked up his orange card today.

## 2015-11-28 ENCOUNTER — Encounter: Payer: Self-pay | Admitting: Internal Medicine

## 2015-12-22 ENCOUNTER — Ambulatory Visit (INDEPENDENT_AMBULATORY_CARE_PROVIDER_SITE_OTHER): Payer: Self-pay | Admitting: Cardiovascular Disease

## 2015-12-22 ENCOUNTER — Encounter (INDEPENDENT_AMBULATORY_CARE_PROVIDER_SITE_OTHER): Payer: Self-pay

## 2015-12-22 ENCOUNTER — Encounter: Payer: Self-pay | Admitting: Cardiovascular Disease

## 2015-12-22 VITALS — BP 104/78 | HR 97 | Ht 73.0 in | Wt 297.0 lb

## 2015-12-22 DIAGNOSIS — I10 Essential (primary) hypertension: Secondary | ICD-10-CM

## 2015-12-22 DIAGNOSIS — I152 Hypertension secondary to endocrine disorders: Secondary | ICD-10-CM

## 2015-12-22 DIAGNOSIS — I952 Hypotension due to drugs: Secondary | ICD-10-CM

## 2015-12-22 DIAGNOSIS — Z9989 Dependence on other enabling machines and devices: Secondary | ICD-10-CM

## 2015-12-22 DIAGNOSIS — R42 Dizziness and giddiness: Secondary | ICD-10-CM

## 2015-12-22 DIAGNOSIS — G4733 Obstructive sleep apnea (adult) (pediatric): Secondary | ICD-10-CM

## 2015-12-22 DIAGNOSIS — I5042 Chronic combined systolic (congestive) and diastolic (congestive) heart failure: Secondary | ICD-10-CM

## 2015-12-22 DIAGNOSIS — E1159 Type 2 diabetes mellitus with other circulatory complications: Secondary | ICD-10-CM

## 2015-12-22 MED ORDER — AMLODIPINE-OLMESARTAN 5-40 MG PO TABS: 1.0000 | ORAL_TABLET | Freq: Every day | ORAL | 3 refills | Status: DC

## 2015-12-22 MED ORDER — FUROSEMIDE 40 MG PO TABS: 40.0000 mg | ORAL_TABLET | Freq: Every day | ORAL | 3 refills | Status: DC

## 2015-12-22 NOTE — Patient Instructions (Signed)
Medication Instructions:  1.) the amlodipine-olmesartan has been decreased to 5/40. A new prescription has been sent to your pharmacy. The furosemide has been decreased to 1 pill daily. ( 40 mg)   Labwork:    Testing/Procedures:    Follow-Up:  3 months with [facility]  Any Other Special Instructions Will Be Listed Below (If Applicable).

## 2015-12-23 NOTE — Progress Notes (Signed)
Patient ID: Terry Parks, male   DOB: [birth year], 60 y.o.   MRN: [id number]     HPI: Terry Parks is a 60 y.o. male with a history of severe obstructive sleep apnea.  I had seen him in October for sleep evaluation.  He presents for follow-up evaluation following getting a new CPAP unit from [facility]  Terry Parks has a history of hypertension, hyperlipidemia, morbid obesity and is felt to have functional class II exertional dyspnea.  In 2009 he was diagnosed with severe obstructive sleep apnea and had an AHI of 108.5 per hour.  His REM AHI was 105.  He was titrated up to a CPAP pressure of 13 cm water pressure and utilized a [employer] full face mask.  He is unemployed and has an orange card.  He has not used CPAP for years and previously had used [employer] as a DME company who in the past had accepted his orange card from [facility].  He saw Dr. Sallyanne Kuster in June and at that time, it became apparent that he had not been utilizing CPAP therapy.  The patient tried to resume using his CPAP.  However, Terry Parks would no longer except his orange card; and  his supplies  are 58 years old.  He had been trying to use his mask which does not fit and he falls asleep holding the mask to his face.  Despite this suboptimal therapy, he has noted some improvement in how he feels with reference to fatigability, hypersomnolence.  Previously, his Epworth Sleepiness Scale score had been 18.  He did not quantify the Epworth Sleepiness scale score when I saw him, but checked that he still had a significant chance of dozing sitting and reading, sitting inactive in a public place, sitting as a passenger in a car for an hour without a break, sitting and talking with someone, sitting quietly after lunch, and in the car stopped for a few minutes in traffic.  He does snore.  He cannot sleep on his back.  He admits to fatigability.  He denies restless legs.  He denies hypogognic hallucinations.  He  denies cataplectic events.  He underwent a follow-up sleep study at [facility] sleep disorder center on 06/05/2015.  This was a split-night protocol. He again was found to have severe obstructive sleep apnea during the diagnostic portion of the study with an AHI of48.5 per hour, and 58.3 per hour with rim sleep.  He has significant oxygen desaturation to 82%.  He was titrated up to 11 cm water pressure with heated communication.    When I last saw him an Epworth sleepiness scale score endorsed at 21.  He had continued to use his is very old CPAP unit without recent supplies.  Since I saw him, he was able to obtain an S9 Elite ResMed CPAP unit from [facility].  This is much more recent than his prior equipment but is not the most recent unit.  Since reinitiating CPAP therapy.  He notes more energy.  I was able to obtain a download from 11/23/2015 through 12/22/2015.  This shows 73% of usage stays and he has only 67% of days greater than 4 hours.  He is averaging 5 hours and 52 minutes of use.  He is set at a CPAP auto mode and his 95th percentile pressure was 13 cm with a maximum 14.8.  AHI is 4.6 per hour.  He admits to experiencing mild episodes of vague chest pain and  shortness of breath with walking.  He denies PND, orthopnea.  An echo Doppler study on 06/16/2015 showed an EF of 40-45% with mild LVH and grade 1 diastolic dysfunction.  Recently, he has noticed some mild lightheadedness.  He sees Dr. Posey Pronto at the outpatient clinic for his primary care.  He presents for follow-up evaluation.  Past Medical History:  Diagnosis Date  . Abscess of groin   . Abscessed tooth   . Allergic rhinitis   . BPH (benign prostatic hypertrophy)    Massive BPH noted on cystoscopy 1/23/ 2012 by Dr. Risa Grill.  . Cardiomegaly    Mild by CXR 11/04/04  . Diabetes mellitus 04/08/2008  . Headache(784.0)   . Hyperlipemia   . Hypertension   . Hypertensive cardiopathy 03/01/2006   2-D echocardiogram 02/01/2012  showed moderate LVH, mildly to moderately reduced left ventricular systolic function with an estimated ejection fraction of 40-45%, and diffuse hypokinesis.  A nuclear medicine stress study done 01/31/2012 showed no reversible ischemia, a small mid anterior wall fixed defect/infarct, and ejection fraction 42%.      . Neck pain   . Obstructive sleep apnea 03/06/2008   Sleep study 03/06/08 showed severe OSA/hypopnea syndrome, with successful CPAP titration to 13 CWP using a medium ResMed Mirage Quattro full face mask with heated humidifier.   . Renal calculus 05/29/2010   CT scan of abdomen/pelvis on 05/29/2010 showed an obstructing approximate 1-2 mm calculus at the left UVJ, and an approximate 1-2 mm left lower pole renal calculus.   Patient had continuing severe pain , and an elevation of his serum creatinine to a value of 1.75 on 06/06/2010.  The stone had apparently passed and was not seen on repeat CT 06/08/2010.    Past Surgical History:  Procedure Laterality Date  . CYSTOSCOPY W/ RETROGRADES      Allergies  Allergen Reactions  . Lisinopril Cough    Current Outpatient Prescriptions  Medication Sig Dispense Refill  . Armodafinil (NUVIGIL) 150 MG tablet Take 1 tablet (150 mg total) by mouth daily. As needed. 30 tablet 5  . aspirin 325 MG EC tablet Take 325 mg by mouth daily.    [name] atorvastatin (LIPITOR) 40 MG tablet Take 1 tablet (40 mg total) by mouth daily. 90 tablet 3  . carvedilol (COREG) 25 MG tablet Take 1.5 tablets (37.5 mg total) by mouth 2 (two) times daily. 60 tablet 6  . DULoxetine (CYMBALTA) 60 MG capsule Take 1 capsule (60 mg total) by mouth daily. 30 capsule 0  . empagliflozin (JARDIANCE) 10 MG TABS tablet Take 10 mg by mouth daily. 30 tablet 11  . furosemide (LASIX) 40 MG tablet Take 1 tablet (40 mg total) by mouth daily. 180 tablet 3  . glucose blood (TRUETRACK TEST) test strip Use as directed to test blood sugar three times a day before meals. 100 each 1  . Insulin Glargine  (LANTUS SOLOSTAR) 100 UNIT/ML Solostar Pen Inject 50 Units into the skin daily. (Patient taking differently: Inject 55 Units into the skin daily. ) 15 mL 0  . Ipratropium-Albuterol (COMBIVENT) 20-100 MCG/ACT AERS respimat Inhale 1 puff into the lungs every 6 (six) hours as needed for wheezing or shortness of breath. 4 g 6  . isosorbide mononitrate (IMDUR) 30 MG 24 hr tablet Take 1 tablet (30 mg total) by mouth daily. 30 tablet 6  . Lancets MISC Use to test blood sugar three times a day before meals. 100 each 1  . metFORMIN (GLUCOPHAGE) 1000 MG tablet Take 1  tablet (1,000 mg total) by mouth 2 (two) times daily with a meal. 60 tablet 11  . sildenafil (VIAGRA) 50 MG tablet Take 1 tablet (50 mg total) by mouth daily as needed for erectile dysfunction (Take 1 hour before sexual activity; do not take more than one dose in a 24 hour period.). 10 tablet 0  . amLODipine-olmesartan (AZOR) 5-40 MG tablet Take 1 tablet by mouth daily. 30 tablet 3   No current facility-administered medications for this visit.     Social History   Social History  . Marital status: Single    Spouse name: N/A  . Number of children: 4 b  . Years of education: 73   Occupational History  .     Unemployed   Social History Main Topics  . Smoking status: Never Smoker  . Smokeless tobacco: Never Used  . Alcohol use No  . Drug use: No  . Sexual activity: Not Currently   Other Topics Concern  . Not on file   Social History Narrative   Divorced, 4 children, lives alone.  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    Family History  Problem Relation Age of Onset  . Breast cancer Mother   . Colon cancer Neg Hx   . Prostate cancer Neg Hx   . Heart attack Neg Hx   . Hypertension Father   . Diabetes Maternal Grandmother      ROS General: Negative; No fevers, chills, or night sweats; positive for morbid obesity HEENT: Occasional changes in vision; no changes in hearing, sinus congestion, difficulty swallowing Pulmonary:  Negative; No cough, wheezing, shortness of breath, hemoptysis Cardiovascular: Negative; No chest pain, presyncope, syncope, palpatations GI: Negative; No nausea, vomiting, diarrhea, or abdominal pain GU: Negative; No dysuria, hematuria, or difficulty voiding Musculoskeletal: Negative; no myalgias, joint pain, or weakness Hematologic: Negative; no easy bruising, bleeding Endocrine: Negative; no heat/cold intolerance Neuro: Negative; no changes in balance, headaches Skin: [place] of occasional dry skin Psychiatric: Negative; No behavioral problems, depression Sleep: see HPI   Physical Exam BP 104/78   Pulse 97   Ht '6\' 1"'  (1.854 m)   Wt 297 lb (134.7 kg)   BMI 39.18 kg/m    Repeat blood pressure by me 88/60 supine and was 82/60 standing.  Wt Readings from Last 3 Encounters:  12/22/15 297 lb (134.7 kg)  10/07/15 297 lb (134.7 kg)  09/10/15 299 lb 6 oz (135.8 kg)   General: Alert, oriented, no distress.  Skin: normal turgor, no rashes HEENT: Normocephalic, atraumatic. Pupils round and reactive; sclera anicteric; extraocular muscles intact; Fundi without hemorrhages or exudates Nose without nasal septal hypertrophy Mouth/Parynx:  Prominent mucoal soft tissue bilaterally on his inner cheeks.  Mallinpatti scale 3/4 Neck: Thick neck; No JVD, no carotid bruits Lungs: clear to ausculatation and percussion; no wheezing or rales  Chest wall: No tenderness to palpation Heart: RRR, s1 s2 normal; faint 1/6 systolic murmur.  No S3 or S4 gallop.  No rubs thrills or heaves. Abdomen: Central adiposity; soft, nontender; no hepatosplenomehaly, BS+; abdominal aorta nontender and not dilated by palpation. Back: No CVA tenderness Pulses 2+ Extremities: no clubbinbg cyanosis or edema, Homan's sign negative  Neurologic: grossly nonfocal; cranial nerves intact. Psychological: Normal affect and mood.  ECG (independently read by me): Normal sinus rhythm at 97 bpm.  No ectopy.  QTC 472  ms.   LABS:  BMP Latest Ref Rng & Units 10/07/2015 04/29/2015 02/18/2015  Glucose 65 - 99 mg/dL 229(H) 485(H) 158(H)  BUN 6 -  24 mg/dL 10 <5(L) 8  Creatinine 0.76 - 1.27 mg/dL 1.11 1.15 1.07  BUN/Creat Ratio 9 - 20 9 - 7(L)  Sodium 134 - 144 mmol/L 139 135 142  Potassium 3.5 - 5.2 mmol/L 4.2 4.1 4.1  Chloride 96 - 106 mmol/L 97 100(L) 99  CO2 18 - 29 mmol/L '22 28 24  ' Calcium 8.7 - 10.2 mg/dL 9.2 9.2 8.9     Hepatic Function Latest Ref Rng & Units 10/07/2015 07/18/2014 04/17/2014  Total Protein 6.0 - 8.5 g/dL 7.2 6.5 7.7  Albumin 3.5 - 5.5 g/dL 4.1 3.8 3.9  AST 0 - 40 IU/L '15 25 12  ' ALT 0 - 44 IU/L '15 24 12  ' Alk Phosphatase 39 - 117 IU/L 91 62 90  Total Bilirubin 0.0 - 1.2 mg/dL 0.5 0.8 0.5  Bilirubin, Direct 0.0 - 0.3 mg/dL - - -     CBC Latest Ref Rng & Units 04/17/2014 01/24/2013 01/15/2013  WBC 4.0 - 10.5 K/uL 7.3 6.1 -  Hemoglobin 13.0 - 17.0 g/dL 14.9 15.5 15.3  Hematocrit 39.0 - 52.0 % 45.4 46.1 45.0  Platelets 150 - 400 K/uL 159 165 -   Lab Results  Component Value Date   MCV 83.6 04/17/2014   MCV 77.5 (L) 01/24/2013   MCV 78.5 10/04/2012    Lipid Panel     Component Value Date/Time   CHOL 227 (H) 10/07/2015 1042   TRIG 188 (H) 10/07/2015 1042   HDL 30 (L) 10/07/2015 1042   CHOLHDL 7.6 (H) 10/07/2015 1042   CHOLHDL 5.1 07/18/2014 1056   VLDL 17 07/18/2014 1056   LDLCALC 159 (H) 10/07/2015 1042     RADIOLOGY: No results found.    ASSESSMENT AND PLAN: [name] is a 60 year old African-American male who has a history of morbid obesity,  hypertension, hyperlipidemia, and previous mild LV dysfunction noted in 2013 with an echo showing 40-45% ejection fraction which had normalized in May 2016.  He has documented severe LVH and severe left atrial dilatation.  In 2009 he was found to have very severe obstructive sleep apnea and initially was on CPAP therapy for several years but had not used CPAP in several years.  He  tried to reinstitute therapy since  June 2016 and did not have any supplies and continues to have old equipment. His most recent sleep study again confirms severe sleep apnea.  He was given an S9 Auto CPAP unit at no cost by [facility].  His download was reviewed with him in detail today.  I discussed with him the importance of 100% of days with use.   He is having inadequate sleep duration which is contributing to his fatigability.  At times he falls asleep before putting on his CPAP unit in bed.  Presently, he is hypotensive which explain some of his recent symptoms of dizziness.  I have recommended he change his amlodipine/olmesartan from 10/42 to 5/40.  I am also recommending that he reduce his furosemide from 80 mg daily to 40 mg daily.  He has not been taking spironolactone.  He will be following up with Dr. Posey Pronto later this week who will reassess his blood pressure with the above changes.  Additional dose reduction may be necessary.  His blood pressure remains low.  I will see him in 6 months for further evaluation.  Time spent: 25 minutes Terry Sine, MD, [facility]  12/23/2015 7:40 PM

## 2015-12-26 ENCOUNTER — Encounter: Payer: Self-pay | Admitting: Internal Medicine

## 2016-01-16 ENCOUNTER — Encounter: Payer: Self-pay | Admitting: Internal Medicine

## 2016-01-16 ENCOUNTER — Ambulatory Visit: Payer: Self-pay

## 2016-01-16 NOTE — Progress Notes (Deleted)
   CC: diabetes  HPI:  [name] is a 60 y.o. male who presents today for diabetes. Please see assessment & plan for status of chronic medical problems.   Past Medical History:  Diagnosis Date  . Abscess of groin   . Abscessed tooth   . Allergic rhinitis   . BPH (benign prostatic hypertrophy)    Massive BPH noted on cystoscopy 1/23/ 2012 by Dr. [name].  . Cardiomegaly    Mild by CXR 11/04/04  . Diabetes mellitus 04/08/2008  . Headache(784.0)   . Hyperlipemia   . Hypertension   . Hypertensive cardiopathy 03/01/2006   2-D echocardiogram 02/01/2012 showed moderate LVH, mildly to moderately reduced left ventricular systolic function with an estimated ejection fraction of 40-45%, and diffuse hypokinesis.  A nuclear medicine stress study done 01/31/2012 showed no reversible ischemia, a small mid anterior wall fixed defect/infarct, and ejection fraction 42%.      . Neck pain   . Obstructive sleep apnea 03/06/2008   Sleep study 03/06/08 showed severe OSA/hypopnea syndrome, with successful CPAP titration to 13 CWP using a medium ResMed Mirage Quattro full face mask with heated humidifier.   . Renal calculus 05/29/2010   CT scan of abdomen/pelvis on 05/29/2010 showed an obstructing approximate 1-2 mm calculus at the left UVJ, and an approximate 1-2 mm left lower pole renal calculus.   Patient had continuing severe pain , and an elevation of his serum creatinine to a value of 1.75 on 06/06/2010.  The stone had apparently passed and was not seen on repeat CT 06/08/2010.    Review of Systems:  Please see each problem below for a pertinent review of systems.  Physical Exam:  There were no vitals filed for this visit. ***   He was seen by cardiology on 12/22/15 at which time he was found to be hypotensive as evident by blood pressures checked. -Supine 88/60 -Standing 82/60  The following medication changes were made. -Decrease amlodipine/olmesartan from 10/40 mg to 5/40 mg  daily -Decrease furosemide from 80 mg to 40 mg daily  Assessment & Plan:   See Encounters Tab for problem based charting.  Patient {GC/GE:[id number]::"discussed with","seen with"} Dr. {NAMES:[id number]::"Butcher","Granfortuna","E. Hoffman","Klima","Mullen","Narendra","Vincent"}

## 2016-01-23 ENCOUNTER — Telehealth: Payer: Self-pay | Admitting: Cardiovascular Disease

## 2016-01-23 NOTE — Telephone Encounter (Signed)
New message    Pt calling about the Cpap machine blowing air in his mouth and that he has a bad tooth in his mouth and wants to know if the doctor can refer him to a dentist or prescribe a pain pill b/c the tooth is causing him severe pain. Please advise.

## 2016-01-23 NOTE — Telephone Encounter (Signed)
Please review your incoming calls and inform me if you have another number associated w this patient encounter. Per notes, call came approx 3:45

## 2016-01-23 NOTE — Telephone Encounter (Addendum)
Have attempted to reach patient at number listed, this is noted to be an invalid number when dialed.

## 2016-01-27 NOTE — Telephone Encounter (Signed)
Attempted to reach -- no valid number on file, no way to reach patient.

## 2016-01-29 ENCOUNTER — Telehealth: Payer: Self-pay | Admitting: *Deleted

## 2016-01-29 NOTE — Telephone Encounter (Signed)
Received call from patient-pt states he has a "bad tooth" and thinks that it is infected, swollen and painful.  Reports he is unable to wear his CPAP because of the pain.  Requesting antibiotic and pain medication for tooth.  Pt does not have dentist.  Pt has PCP and has appt next month.  Referred patient to PCP, advised to call and see if they can see him soon  otherwise will need to go to UC.  Pt verbalized understanding.

## 2016-01-30 ENCOUNTER — Encounter: Payer: Self-pay | Admitting: Internal Medicine

## 2016-01-30 ENCOUNTER — Ambulatory Visit (INDEPENDENT_AMBULATORY_CARE_PROVIDER_SITE_OTHER): Payer: Self-pay | Admitting: Internal Medicine

## 2016-01-30 VITALS — BP 185/96 | HR 75 | Temp 98.0°F | Ht 73.0 in | Wt 302.1 lb

## 2016-01-30 DIAGNOSIS — G4733 Obstructive sleep apnea (adult) (pediatric): Secondary | ICD-10-CM

## 2016-01-30 DIAGNOSIS — K0889 Other specified disorders of teeth and supporting structures: Secondary | ICD-10-CM

## 2016-01-30 DIAGNOSIS — I10 Essential (primary) hypertension: Secondary | ICD-10-CM

## 2016-01-30 MED ORDER — AMOXICILLIN-POT CLAVULANATE 875-125 MG PO TABS: 1.0000 | ORAL_TABLET | Freq: Two times a day (BID) | ORAL | 0 refills | Status: AC

## 2016-01-30 MED ORDER — TRAMADOL HCL 50 MG PO TABS: 50.0000 mg | ORAL_TABLET | Freq: Four times a day (QID) | ORAL | 0 refills | Status: DC | PRN

## 2016-01-30 NOTE — Patient Instructions (Signed)
Thank you for coming to see me today. It was a pleasure. Today we talked about:   Tooth Pain: - we have referred you to the dentist. - please take augmentin twice daily for 7 days  - take Ultram as needed for your pain  Please follow-up with [place] in 2 weeks for tooth pain and blood pressure  If you have any questions or concerns, please do not hesitate to call the office at [phone].  Take Care,   Jule Ser, DO

## 2016-01-30 NOTE — Assessment & Plan Note (Signed)
A: His blood pressure is also elevated today at 185/96 but he did not take any of his medications this morning.  He denies headache, blurry vision or chest pain.  Pain may also be contributing in part to his elevated BP.  P: - told patient to take his medications when he gets home. - follow up in 2 weeks for follow up of his tooth pain and recheck BP

## 2016-01-30 NOTE — Assessment & Plan Note (Signed)
A: With 2 weeks of continued tooth pain and associated soft tissue swelling of his mandible, I believe a course of antibiotics is reasonable to treat a potential underlying infection.  His pain is uncontrolled on NSAIDs and about 3000mg  daily of Tylenol so I also believe he needs more adequate pain relief in the short term.  P: - amoxicillin-clavulanate 875-125mg  BID x 7 days. - tramadol 50mg  Q6H as needed.  #28 tablets, no refills. - dentistry referral. - follow up in 2 weeks.

## 2016-01-30 NOTE — Progress Notes (Signed)
Internal Medicine Clinic Attending  Case discussed with Dr. Wallace at the time of the visit.  We reviewed the resident's history and exam and pertinent patient test results.  I agree with the assessment, diagnosis, and plan of care documented in the resident's note.  

## 2016-01-30 NOTE — Progress Notes (Signed)
CC: here for dental pain  HPI:  [name] is a 60 y.o. man with a past medical history listed below here today for left bottom tooth and gum pain.  Symptoms began 2 weeks ago and he reports no prior tooth pain like this other than cavitities.  He last saw a dentist 2 years ago.  His pain is aggravated by chewing, eating, and wearing his CPAP.  He is also tender over his gum line in this area.  He is using Tylenol, NSAIDs and warm salt water gargles for mild relief.  He has not experienced fevers, chills, n/v/d, dysphagia, odynophagia, or bleeding gums.  He is unsure if he feels an abscess but has noticed the swelling.  He did not have an injury to his mouth.  His PMHx does include an abscessed tooth.   His blood pressure is also elevated today at 185/96 but he did not take any of his medications this morning.  He denies headache, blurry vision or chest pain.  For details of today's visit and the status of his chronic medical issues please refer to the assessment and plan.   Past Medical History:  Diagnosis Date  . Abscess of groin   . Abscessed tooth   . Allergic rhinitis   . BPH (benign prostatic hypertrophy)    Massive BPH noted on cystoscopy 1/23/ 2012 by Dr. Risa Grill.  . Cardiomegaly    Mild by CXR 11/04/04  . Diabetes mellitus 04/08/2008  . Headache(784.0)   . Hyperlipemia   . Hypertension   . Hypertensive cardiopathy 03/01/2006   2-D echocardiogram 02/01/2012 showed moderate LVH, mildly to moderately reduced left ventricular systolic function with an estimated ejection fraction of 40-45%, and diffuse hypokinesis.  A nuclear medicine stress study done 01/31/2012 showed no reversible ischemia, a small mid anterior wall fixed defect/infarct, and ejection fraction 42%.      . Neck pain   . Obstructive sleep apnea 03/06/2008   Sleep study 03/06/08 showed severe OSA/hypopnea syndrome, with successful CPAP titration to 13 CWP using a medium ResMed Mirage Quattro full face mask  with heated humidifier.   . Renal calculus 05/29/2010   CT scan of abdomen/pelvis on 05/29/2010 showed an obstructing approximate 1-2 mm calculus at the left UVJ, and an approximate 1-2 mm left lower pole renal calculus.   Patient had continuing severe pain , and an elevation of his serum creatinine to a value of 1.75 on 06/06/2010.  The stone had apparently passed and was not seen on repeat CT 06/08/2010.    Review of Systems:   Please see pertinent ROS reviewed in HPI and problem based charting.  Physical Exam:  Vitals:   01/30/16 0936  BP: (!) 185/96  Pulse: 75  Temp: 98 F (36.7 C)  TempSrc: Oral  SpO2: 100%  Weight: (!) 302 lb 1.6 oz (137 kg)  Height: 6\' 1"  (1.854 m)   Physical Exam  Constitutional: He is oriented to person, place, and time.  Obese, pleasant, AA man sitting up in chair, no distress but uncomfortable.  HENT:  Mild soft tissue swelling and tenderness of his lower mandible with no obvious tooth abscess or oropharyngeal lesion identified.  Overall, dentition is relatively poor. Clearing secretions adequately.  No swelling of his neck.  Eyes: EOM are normal.  Neck: Normal range of motion. Neck supple.  Lymphadenopathy:    He has no cervical adenopathy.  Neurological: He is alert and oriented to person, place, and time. No cranial nerve deficit.  Skin:  Skin is warm and dry. No erythema.  Psychiatric: Mood and affect normal.     Assessment & Plan:   See Encounters Tab for problem based charting.  Patient discussed with Dr. Dareen Piano.  Tooth pain A: With 2 weeks of continued tooth pain and associated soft tissue swelling of his mandible, I believe a course of antibiotics is reasonable to treat a potential underlying infection.  His pain is uncontrolled on NSAIDs and about 3000mg  daily of Tylenol so I also believe he needs more adequate pain relief in the short term.  P: - amoxicillin-clavulanate 875-125mg  BID x 7 days. - tramadol 50mg  Q6H as needed.  #28  tablets, no refills. - dentistry referral. - follow up in 2 weeks.  Essential hypertension A: His blood pressure is also elevated today at 185/96 but he did not take any of his medications this morning.  He denies headache, blurry vision or chest pain.  Pain may also be contributing in part to his elevated BP.  P: - told patient to take his medications when he gets home. - follow up in 2 weeks for follow up of his tooth pain and recheck BP  Obstructive sleep apnea A: He has been unable to wear his CPAP in light of his tooth pain.  P: - treat tooth pain. - resume CPAP as soon as possible.

## 2016-01-30 NOTE — Assessment & Plan Note (Signed)
A: He has been unable to wear his CPAP in light of his tooth pain.  P: - treat tooth pain. - resume CPAP as soon as possible.

## 2016-02-13 ENCOUNTER — Other Ambulatory Visit: Payer: Self-pay | Admitting: Internal Medicine

## 2016-02-13 ENCOUNTER — Ambulatory Visit (INDEPENDENT_AMBULATORY_CARE_PROVIDER_SITE_OTHER): Payer: Self-pay | Admitting: Internal Medicine

## 2016-02-13 ENCOUNTER — Encounter: Payer: Self-pay | Admitting: Internal Medicine

## 2016-02-13 DIAGNOSIS — I10 Essential (primary) hypertension: Secondary | ICD-10-CM

## 2016-02-13 DIAGNOSIS — Z79899 Other long term (current) drug therapy: Secondary | ICD-10-CM

## 2016-02-13 DIAGNOSIS — E1142 Type 2 diabetes mellitus with diabetic polyneuropathy: Secondary | ICD-10-CM

## 2016-02-13 DIAGNOSIS — K0889 Other specified disorders of teeth and supporting structures: Secondary | ICD-10-CM

## 2016-02-13 MED ORDER — TRAMADOL HCL 50 MG PO TABS: 50.0000 mg | ORAL_TABLET | Freq: Two times a day (BID) | ORAL | 0 refills | Status: DC | PRN

## 2016-02-13 MED ORDER — SPIRONOLACTONE 25 MG PO TABS: 25.0000 mg | ORAL_TABLET | Freq: Every day | ORAL | 2 refills | Status: DC

## 2016-02-13 NOTE — Progress Notes (Signed)
CC: here for f/u BP and tooth pain  HPI:  Terry Parks is a 60 y.o. man with a past medical history listed below here today for follow up of his HTN and tooth pain.   For details of today's visit and the status of his chronic medical issues please refer to the assessment and plan.   Past Medical History:  Diagnosis Date  . Abscess of groin   . Abscessed tooth   . Allergic rhinitis   . BPH (benign prostatic hypertrophy)    Massive BPH noted on cystoscopy 1/23/ 2012 by Dr. Risa Grill.  . Cardiomegaly    Mild by CXR 11/04/04  . Diabetes mellitus 04/08/2008  . Headache(784.0)   . Hyperlipemia   . Hypertension   . Hypertensive cardiopathy 03/01/2006   2-D echocardiogram 02/01/2012 showed moderate LVH, mildly to moderately reduced left ventricular systolic function with an estimated ejection fraction of 40-45%, and diffuse hypokinesis.  A nuclear medicine stress study done 01/31/2012 showed no reversible ischemia, a small mid anterior wall fixed defect/infarct, and ejection fraction 42%.      . Neck pain   . Obstructive sleep apnea 03/06/2008   Sleep study 03/06/08 showed severe OSA/hypopnea syndrome, with successful CPAP titration to 13 CWP using a medium ResMed Mirage Quattro full face mask with heated humidifier.   . Renal calculus 05/29/2010   CT scan of abdomen/pelvis on 05/29/2010 showed an obstructing approximate 1-2 mm calculus at the left UVJ, and an approximate 1-2 mm left lower pole renal calculus.   Patient had continuing severe pain , and an elevation of his serum creatinine to a value of 1.75 on 06/06/2010.  The stone had apparently passed and was not seen on repeat CT 06/08/2010.    Review of Systems:  Please see pertinent ROS reviewed in HPI and problem based charting.     Physical Exam  Constitutional: He is oriented to person, place, and time and well-developed, well-nourished, and in no distress.  HENT:  Mouth/Throat: Oropharynx is clear and moist. No oropharyngeal  exudate.  Soft tissue swelling has improved. No abscess appreciated. He has relatively poor dentition with bottom left tooth that is rotted and has cavity.  Pulmonary/Chest: Effort normal.  Neurological: He is alert and oriented to person, place, and time.  Psychiatric: Mood and affect normal.      Vitals:   02/13/16 0929  BP: (!) 186/101  Pulse: 79  Temp: 97.9 F (36.6 C)  TempSrc: Oral  SpO2: 100%  Weight: 300 lb (136.1 kg)  Height: 6\' 1"  (1.854 m)    Assessment & Plan:   See Encounters Tab for problem based charting.  Patient discussed with Dr. Lynnae January.  Essential hypertension BP Readings from Last 3 Encounters:  02/13/16 (!) 186/101  01/30/16 (!) 185/96  12/22/15 104/78   A: BP again very uncontrolled.  He did not take his medications again this morning, stating he has them in his pocket to take afterwards when he has breakfast.  Despite this, we felt that adding another BP agent could be warranted and attempted to prescribe spironolactone 25mg  daily.  However, patient declined this stating he would rather follow up with his PCP in 2 weeks as scheduled for further management of his chronic illnesses.  He will continue to take his other medications and states he will take prior to his next visit.  P: - continue Azor, Coreg, Lasix and Imdur -- patient also with Viagra on his medication list.  I did not have a chance  to discuss if he has been using this recently but I will remove from his medication list as its use is not recommended with Imdur - follow up in 2 weeks with PCP  Tooth pain A: Patient with improvement in pain and swelling.  Has completed course of antibiotics and finished the #28 tramadol prescribed.  He has not been using ibuprofen as I had requested but does still use about 1000mg  Tylenol 2-3 times per day.  Although the pain is better, it still causes him a lot of discomfort and he is requesting another refill until he sees the dentist in about 2  weeks.  P: - no further antibiotics indicated - continue tylenol as needed - gave another #30 tablets of tramadol.  We were very clear that this was only a temporary medication to use for his tooth pain until he sees the dentist and he expressed understanding - follow up with dentist

## 2016-02-13 NOTE — Patient Instructions (Signed)
Thank you for coming to see me today. It was a pleasure. Today we talked about:   Blood Pressure: - Your blood pressure is very high and we think we need to add another medication to your regimen. - I have sent in a prescription to the health department for spironolactone 25mg  daily  Tooth Pain: - we have given you another refill of your Tramadol understanding there will be no further refills.  Please also be sure to see your dentist.  Please follow-up with Dr. Posey Pronto in 2 weeks to follow up your other medical issues.  If you have any questions or concerns, please do not hesitate to call the office at [phone].  Take Care,   Jule Ser, DO

## 2016-02-13 NOTE — Assessment & Plan Note (Addendum)
BP Readings from Last 3 Encounters:  02/13/16 (!) 186/101  01/30/16 (!) 185/96  12/22/15 104/78   A: BP again very uncontrolled.  He did not take his medications again this morning, stating he has them in his pocket to take afterwards when he has breakfast.  Despite this, we felt that adding another BP agent could be warranted and attempted to prescribe spironolactone 25mg  daily.  However, patient declined this stating he would rather follow up with his PCP in 2 weeks as scheduled for further management of his chronic illnesses.  He will continue to take his other medications and states he will take prior to his next visit.  P: - continue Azor, Coreg, Lasix and Imdur -- patient also with Viagra on his medication list.  I did not have a chance to discuss if he has been using this recently but I will remove from his medication list as its use is not recommended with Imdur - follow up in 2 weeks with PCP

## 2016-02-13 NOTE — Assessment & Plan Note (Signed)
A: Patient with improvement in pain and swelling.  Has completed course of antibiotics and finished the #28 tramadol prescribed.  He has not been using ibuprofen as I had requested but does still use about 1000mg  Tylenol 2-3 times per day.  Although the pain is better, it still causes him a lot of discomfort and he is requesting another refill until he sees the dentist in about 2 weeks.  P: - no further antibiotics indicated - continue tylenol as needed - gave another #30 tablets of tramadol.  We were very clear that this was only a temporary medication to use for his tooth pain until he sees the dentist and he expressed understanding - follow up with dentist

## 2016-02-16 ENCOUNTER — Other Ambulatory Visit: Payer: Self-pay | Admitting: *Deleted

## 2016-02-16 DIAGNOSIS — E1142 Type 2 diabetes mellitus with diabetic polyneuropathy: Secondary | ICD-10-CM

## 2016-02-16 MED ORDER — INSULIN GLARGINE 100 UNIT/ML SOLOSTAR PEN: 50.0000 [IU] | PEN_INJECTOR | Freq: Every day | SUBCUTANEOUS | 5 refills | Status: DC

## 2016-02-16 MED ORDER — DULOXETINE HCL 60 MG PO CPEP: 60.0000 mg | ORAL_CAPSULE | Freq: Every day | ORAL | 0 refills | Status: DC

## 2016-02-16 NOTE — Telephone Encounter (Signed)
I will not refill tramadol as he has yet to fill the 30 tablets prescribed to him by Dr. Juleen China on 9/29.   He has an appointment with me on 10/13 at which time I will reassess his response to duloxetine for his neuropathy as well as his diabetes.

## 2016-02-16 NOTE — Progress Notes (Signed)
Internal Medicine Clinic Attending  Case discussed with Dr. Wallace at the time of the visit.  We reviewed the resident's history and exam and pertinent patient test results.  I agree with the assessment, diagnosis, and plan of care documented in the resident's note.  

## 2016-02-26 ENCOUNTER — Telehealth: Payer: Self-pay | Admitting: Internal Medicine

## 2016-02-26 NOTE — Telephone Encounter (Signed)
APT. REMINDER CALL, NO ANSWER, NO VOICEMAIL °

## 2016-02-27 ENCOUNTER — Encounter: Payer: Self-pay | Admitting: Internal Medicine

## 2016-02-27 NOTE — Progress Notes (Deleted)
   CC: Diabetes  HPI:  [name] is a 60 y.o. male who presents today for diabetes. Please see assessment & plan for status of chronic medical problems.   Past Medical History:  Diagnosis Date  . Abscess of groin   . Abscessed tooth   . Allergic rhinitis   . BPH (benign prostatic hypertrophy)    Massive BPH noted on cystoscopy 1/23/ 2012 by Dr. Risa Grill.  . Cardiomegaly    Mild by CXR 11/04/04  . Diabetes mellitus 04/08/2008  . Headache(784.0)   . Hyperlipemia   . Hypertension   . Hypertensive cardiopathy 03/01/2006   2-D echocardiogram 02/01/2012 showed moderate LVH, mildly to moderately reduced left ventricular systolic function with an estimated ejection fraction of 40-45%, and diffuse hypokinesis.  A nuclear medicine stress study done 01/31/2012 showed no reversible ischemia, a small mid anterior wall fixed defect/infarct, and ejection fraction 42%.      . Neck pain   . Obstructive sleep apnea 03/06/2008   Sleep study 03/06/08 showed severe OSA/hypopnea syndrome, with successful CPAP titration to 13 CWP using a medium ResMed Mirage Quattro full face mask with heated humidifier.   . Renal calculus 05/29/2010   CT scan of abdomen/pelvis on 05/29/2010 showed an obstructing approximate 1-2 mm calculus at the left UVJ, and an approximate 1-2 mm left lower pole renal calculus.   Patient had continuing severe pain , and an elevation of his serum creatinine to a value of 1.75 on 06/06/2010.  The stone had apparently passed and was not seen on repeat CT 06/08/2010.    Review of Systems:  Please see each problem below for a pertinent review of systems.  Physical Exam:  There were no vitals filed for this visit. ***  #1 Diabetes A1c today is ***  #2 Dental pain For the [place], he last refilled tramadol 30 mg 28 tablets on 01/30/16 has not refilled any additional controlled substances since.  #3 HTN He saw his cardiologist Dr. Claiborne Billings on 12/22/15 at which time  the following changes were made to his medications due to concern for hypotension. -Decreased amlodipine/olmesartan 10/40 mg to 5/40 mg daily -Decreased furosemide from 80 mg to 40 mg daily -Confirmed nonadherence to spironolactone   Assessment & Plan:   See Encounters Tab for problem based charting.  Patient {GC/GE:[id number]::"discussed with","seen with"} Dr. {NAMES:[id number]::"Butcher","Granfortuna","E. Hoffman","Klima","Mullen","Narendra","Vincent"}

## 2016-04-05 ENCOUNTER — Encounter: Payer: Self-pay | Admitting: *Deleted

## 2016-04-05 ENCOUNTER — Ambulatory Visit: Payer: Self-pay | Admitting: Cardiovascular Disease

## 2016-06-15 ENCOUNTER — Other Ambulatory Visit: Payer: Self-pay | Admitting: *Deleted

## 2016-06-15 DIAGNOSIS — E1142 Type 2 diabetes mellitus with diabetic polyneuropathy: Secondary | ICD-10-CM

## 2016-06-16 MED ORDER — DULOXETINE HCL 60 MG PO CPEP: 60.0000 mg | ORAL_CAPSULE | Freq: Every day | ORAL | 6 refills | Status: DC

## 2016-06-16 NOTE — Telephone Encounter (Signed)
As this patient was seen by me and deemed necessary for their treatment, I will refill this prescription for duloxetine. He is scheduled for f/u on 06/22/16 and can hopefully see me before 11/13/16.

## 2016-06-22 ENCOUNTER — Encounter: Payer: Self-pay | Admitting: Pulmonary Disease

## 2016-06-22 ENCOUNTER — Ambulatory Visit (INDEPENDENT_AMBULATORY_CARE_PROVIDER_SITE_OTHER): Payer: Self-pay | Admitting: Pulmonary Disease

## 2016-06-22 VITALS — BP 134/94 | HR 81 | Temp 97.5°F | Wt 286.0 lb

## 2016-06-22 DIAGNOSIS — M791 Myalgia, unspecified site: Secondary | ICD-10-CM

## 2016-06-22 DIAGNOSIS — Z79899 Other long term (current) drug therapy: Secondary | ICD-10-CM

## 2016-06-22 DIAGNOSIS — E1142 Type 2 diabetes mellitus with diabetic polyneuropathy: Secondary | ICD-10-CM

## 2016-06-22 DIAGNOSIS — I10 Essential (primary) hypertension: Secondary | ICD-10-CM

## 2016-06-22 DIAGNOSIS — E559 Vitamin D deficiency, unspecified: Secondary | ICD-10-CM | POA: Insufficient documentation

## 2016-06-22 DIAGNOSIS — Z794 Long term (current) use of insulin: Secondary | ICD-10-CM

## 2016-06-22 DIAGNOSIS — Z9119 Patient's noncompliance with other medical treatment and regimen: Secondary | ICD-10-CM

## 2016-06-22 LAB — GLUCOSE, CAPILLARY: GLUCOSE-CAPILLARY: 246 mg/dL — AB (ref 65–99)

## 2016-06-22 LAB — POCT GLYCOSYLATED HEMOGLOBIN (HGB A1C): HEMOGLOBIN A1C: 12.7

## 2016-06-22 MED ORDER — INSULIN GLARGINE 100 UNIT/ML SOLOSTAR PEN: 56.0000 [IU] | PEN_INJECTOR | Freq: Every day | SUBCUTANEOUS | 5 refills | Status: DC

## 2016-06-22 MED ORDER — AMLODIPINE-OLMESARTAN 5-40 MG PO TABS: 1.0000 | ORAL_TABLET | Freq: Every day | ORAL | 3 refills | Status: DC

## 2016-06-22 NOTE — Assessment & Plan Note (Addendum)
Assessment: Worsening of Hgb A1c to 12.7 from 11.4%. Cites noncompliance of medication and dietary indiscretion. Is working on compliance of medication by moving insulin dose to mornings.  Plan: Pt prefers to work on compliance and diet. Continue empagliflozin 10mg  daily, metformin 1000mg  BID, and Lantus at 56u daily. Follow up in 3-4 weeks with meter Foot exam done today He would like to have retinal exam done at next appt

## 2016-06-22 NOTE — Progress Notes (Signed)
   CC: Diabetes follow up  HPI:  [name] is a 61 y.o. man with history as outlined below presenting for follow up of his diabetes.  He felt like the Cymbalta caused him to be dizzy, generalized weakness. He just started this medication 3 weeks ago.   He has occasional right upper thigh pain. Feels like a muscle ache. Sensitive to touch occasionally. Has been two months.  He feels that he has problems with medication compliance and dietary indiscretion. No episodes of hypoglycemia. He has been taking Lantus 56u. Usually misses 3-4 doses in a week.    Past Medical History:  Diagnosis Date  . Abscess of groin   . Abscessed tooth   . Allergic rhinitis   . BPH (benign prostatic hypertrophy)    Massive BPH noted on cystoscopy 1/23/ 2012 by Dr. Risa Grill.  . Cardiomegaly    Mild by CXR 11/04/04  . Diabetes mellitus 04/08/2008  . Headache(784.0)   . Hyperlipemia   . Hypertension   . Hypertensive cardiopathy 03/01/2006   2-D echocardiogram 02/01/2012 showed moderate LVH, mildly to moderately reduced left ventricular systolic function with an estimated ejection fraction of 40-45%, and diffuse hypokinesis.  A nuclear medicine stress study done 01/31/2012 showed no reversible ischemia, a small mid anterior wall fixed defect/infarct, and ejection fraction 42%.      . Neck pain   . Obstructive sleep apnea 03/06/2008   Sleep study 03/06/08 showed severe OSA/hypopnea syndrome, with successful CPAP titration to 13 CWP using a medium ResMed Mirage Quattro full face mask with heated humidifier.   . Renal calculus 05/29/2010   CT scan of abdomen/pelvis on 05/29/2010 showed an obstructing approximate 1-2 mm calculus at the left UVJ, and an approximate 1-2 mm left lower pole renal calculus.   Patient had continuing severe pain , and an elevation of his serum creatinine to a value of 1.75 on 06/06/2010.  The stone had apparently passed and was not seen on repeat CT 06/08/2010.    Review of Systems:     No fevers or chills No diarrhea  Physical Exam:  Vitals:   06/22/16 0935  BP: (!) 134/94  Pulse: 81  Temp: 97.5 F (36.4 C)  TempSrc: Oral  SpO2: 100%  Weight: 286 lb (129.7 kg)   General Apperance: NAD HEENT: Normocephalic, atraumatic, anicteric sclera Neck: Supple, trachea midline Lungs: Clear to auscultation bilaterally. No wheezes, rhonchi or rales. Breathing comfortably on room air Heart: Regular rate and rhythm Abdomen: Soft, nontender, nondistended, no rebound/guarding Extremities: Warm and well perfused, no edema Skin: No rashes or lesions Neurologic: Alert and interactive. No gross deficits.   Assessment & Plan:   See Encounters Tab for problem based charting.  Patient discussed with Dr. Angelia Mould

## 2016-06-22 NOTE — Assessment & Plan Note (Addendum)
Intermittent right thigh myalgias. Will check for statin induced myopathy with CK. Also will check vitamin D level as this can cause myalgias in statin use.  ADDENDUM: CK normal but he does have undetectable vitamin D level. 50,000 international units of vitamin D orally once per week for six to eight weeks. Recheck level after that. Discussed with patient over phone.

## 2016-06-22 NOTE — Patient Instructions (Signed)
Please take your medications as prescribed Follow up in 3-4 weeks with your meter

## 2016-06-22 NOTE — Assessment & Plan Note (Signed)
Did not tolerate duloxetine 60mg  daily. Still has neuropathy pain but wants to work on diabetes control. May consider lower dose of duloxetine if he is willing to restart this in the future.

## 2016-06-22 NOTE — Assessment & Plan Note (Signed)
Assessment: BP improved to 134/94. Clarified that he is not taking Viagra with Imdur.  Plan:  Continue carvedilol 27.5 BID Continue Lasix - need to clarify if he is taking 40 or 80mg  at follow up Uncertain if he is taking amlodipine-olmesartan 5-40mg . He did not have bottle. Will restart. May be able to reduce down to just ARB if his blood pressure control is okay.

## 2016-06-23 ENCOUNTER — Telehealth: Payer: Self-pay | Admitting: Dietician

## 2016-06-23 LAB — CK: Total CK: 108 U/L (ref 24–204)

## 2016-06-23 LAB — VITAMIN D 25 HYDROXY (VIT D DEFICIENCY, FRACTURES): Vit D, 25-Hydroxy: <4 ng/mL — ABNORMAL LOW (ref 30.0–100.0)

## 2016-06-23 NOTE — Telephone Encounter (Signed)
Patient agreed to make an apopintment for retinal images on same day he sees the doctor in 3-4 weeks.

## 2016-06-24 MED ORDER — VITAMIN D (ERGOCALCIFEROL) 1.25 MG (50000 UNIT) PO CAPS: 50000.0000 [IU] | ORAL_CAPSULE | ORAL | 0 refills | Status: DC

## 2016-06-24 NOTE — Addendum Note (Signed)
Addended by: Jacques Earthly T on: 06/24/2016 10:01 AM   Modules accepted: Orders

## 2016-06-24 NOTE — Progress Notes (Signed)
Internal Medicine Clinic Attending  Case discussed with Dr. Krall at the time of the visit.  We reviewed the resident's history and exam and pertinent patient test results.  I agree with the assessment, diagnosis, and plan of care documented in the resident's note.  

## 2016-06-28 ENCOUNTER — Other Ambulatory Visit: Payer: Self-pay | Admitting: Internal Medicine

## 2016-06-28 DIAGNOSIS — E1142 Type 2 diabetes mellitus with diabetic polyneuropathy: Secondary | ICD-10-CM

## 2016-07-19 ENCOUNTER — Telehealth: Payer: Self-pay | Admitting: Internal Medicine

## 2016-07-19 NOTE — Telephone Encounter (Signed)
APT. REMINDER CALL, NO ANSWER, NO VOICEMAIL °

## 2016-07-20 ENCOUNTER — Ambulatory Visit (INDEPENDENT_AMBULATORY_CARE_PROVIDER_SITE_OTHER): Payer: Self-pay | Admitting: Internal Medicine

## 2016-07-20 ENCOUNTER — Ambulatory Visit: Payer: Self-pay

## 2016-07-20 ENCOUNTER — Encounter: Payer: Self-pay | Admitting: Internal Medicine

## 2016-07-20 VITALS — BP 130/88 | HR 80 | Temp 97.5°F | Ht 73.0 in | Wt 286.4 lb

## 2016-07-20 DIAGNOSIS — E1142 Type 2 diabetes mellitus with diabetic polyneuropathy: Secondary | ICD-10-CM

## 2016-07-20 DIAGNOSIS — Z9114 Patient's other noncompliance with medication regimen: Secondary | ICD-10-CM

## 2016-07-20 DIAGNOSIS — E0849 Diabetes mellitus due to underlying condition with other diabetic neurological complication: Secondary | ICD-10-CM

## 2016-07-20 DIAGNOSIS — E1165 Type 2 diabetes mellitus with hyperglycemia: Secondary | ICD-10-CM

## 2016-07-20 DIAGNOSIS — E559 Vitamin D deficiency, unspecified: Secondary | ICD-10-CM

## 2016-07-20 DIAGNOSIS — Z794 Long term (current) use of insulin: Secondary | ICD-10-CM

## 2016-07-20 MED ORDER — VITAMIN D (ERGOCALCIFEROL) 1.25 MG (50000 UNIT) PO CAPS: 50000.0000 [IU] | ORAL_CAPSULE | ORAL | 0 refills | Status: DC

## 2016-07-20 MED ORDER — METFORMIN HCL 1000 MG PO TABS: 1000.0000 mg | ORAL_TABLET | Freq: Two times a day (BID) | ORAL | 11 refills | Status: DC

## 2016-07-20 NOTE — Progress Notes (Signed)
Case discussed with Dr. Ahmed at the time of the visit. We reviewed the resident's history and exam and pertinent patient test results. I agree with the assessment, diagnosis, and plan of care documented in the resident's note. 

## 2016-07-20 NOTE — Patient Instructions (Signed)
Please continue your medications.  Keep checking your blood sugar daily at least 3 times a day.  Follow up with Dr. Posey Pronto in 4-6 weeks.

## 2016-07-20 NOTE — Progress Notes (Signed)
   CC: DM II follow up   HPI:  [name] is a 61 y.o. with PMH as listed below is here for diabetes follow up.   Past Medical History:  Diagnosis Date  . Abscess of groin   . Abscessed tooth   . Allergic rhinitis   . BPH (benign prostatic hypertrophy)    Massive BPH noted on cystoscopy 1/23/ 2012 by Dr. Risa Grill.  . Cardiomegaly    Mild by CXR 11/04/04  . Diabetes mellitus 04/08/2008  . Headache(784.0)   . Hyperlipemia   . Hypertension   . Hypertensive cardiopathy 03/01/2006   2-D echocardiogram 02/01/2012 showed moderate LVH, mildly to moderately reduced left ventricular systolic function with an estimated ejection fraction of 40-45%, and diffuse hypokinesis.  A nuclear medicine stress study done 01/31/2012 showed no reversible ischemia, a small mid anterior wall fixed defect/infarct, and ejection fraction 42%.      . Neck pain   . Obstructive sleep apnea 03/06/2008   Sleep study 03/06/08 showed severe OSA/hypopnea syndrome, with successful CPAP titration to 13 CWP using a medium ResMed Mirage Quattro full face mask with heated humidifier.   . Renal calculus 05/29/2010   CT scan of abdomen/pelvis on 05/29/2010 showed an obstructing approximate 1-2 mm calculus at the left UVJ, and an approximate 1-2 mm left lower pole renal calculus.   Patient had continuing severe pain , and an elevation of his serum creatinine to a value of 1.75 on 06/06/2010.  The stone had apparently passed and was not seen on repeat CT 06/08/2010.   DM II - has noncompliance hx with his medications. Last hgba1c increased from 11.4 to 12.7. Currently on metformin 1000mg  bid, lantus 56 units + Jardiance 10mg  daily. Still not taking metformin bid every day, not open to taking higher dose of Jardiance.   Could not pick up vitamin D from health dept, wants me to [facility] rx [facility]. His muscle pain is better now.    Review of Systems:   Review of Systems  Constitutional: Negative for chills and fever.    Cardiovascular: Negative for chest pain and palpitations.  Genitourinary: Negative for dysuria.  Neurological: Negative for dizziness and headaches.     Physical Exam:  Vitals:   07/20/16 0922 07/20/16 0942  BP: (!) 150/85 130/88  Pulse: 80   Temp: 97.5 F (36.4 C)   TempSrc: Oral   SpO2: 100%   Weight: 286 lb 6.4 oz (129.9 kg)   Height: 6\' 1"  (1.854 m)    Physical Exam  Constitutional: He is oriented to person, place, and time. He appears well-developed and well-nourished. No distress.  HENT:  Head: Normocephalic and atraumatic.  Eyes: Conjunctivae are normal.  Cardiovascular: Normal rate and regular rhythm.  Exam reveals no friction rub.   No murmur heard. Respiratory: Effort normal and breath sounds normal. No respiratory distress.  GI: Soft. Bowel sounds are normal. He exhibits no distension.  Neurological: He is alert and oriented to person, place, and time.  Skin: He is not diaphoretic.    Assessment & Plan:   See Encounters Tab for problem based charting.  Patient discussed with Dr. Eppie Gibson

## 2016-07-20 NOTE — Assessment & Plan Note (Signed)
Could not pick up vitamin D from health dept, wants me to send rx [facility] which I did.

## 2016-07-20 NOTE — Assessment & Plan Note (Signed)
CBG avg is 250, range high 100's to 300's. No hypoglycemias. States he takes his lantus but forgets to take his metformin regularly. He is on high dose of lantus but not on meal time, he is not open to any meal time insulin at this time. He also does not want to go up on Jardiance. I feel that noncompliance will be factor for him so I will not make any changes for now. He wants more time to try to be more compliance with his lifestyle changes and meds.  -cont  metformin 1000mg  bid, lantus 56 units + Jardiance 10mg  daily - f/up with PCP in 1 month.

## 2016-07-20 NOTE — Addendum Note (Signed)
Addended by: Dellia Nims on: 07/20/2016 09:53 AM   Modules accepted: Orders

## 2016-10-13 ENCOUNTER — Encounter: Payer: Self-pay | Admitting: *Deleted

## 2016-10-18 ENCOUNTER — Telehealth: Payer: Self-pay

## 2016-10-18 NOTE — Telephone Encounter (Signed)
Thank you for the update!

## 2016-10-18 NOTE — Telephone Encounter (Signed)
Called pt - voiced mailbox has not been set-up; unable to leave message.  Pt called back - stated needs Furosemide refilled;needs to renew orange card but Marlana Latus is not here today. I called [facility], talked to [place] - last rx was from cardiology office and pt needs to update his info @ the [facility] program. Called pt back - informed to call cardiologist (Dr Claiborne Billings) since last refill on 12/2015 per [facility]. Also to update his info - stated he will do this tomorrow (see [place] and go to [facility]).

## 2016-10-18 NOTE — Telephone Encounter (Signed)
Requesting to speak with a nurse about meds.  

## 2016-10-19 ENCOUNTER — Telehealth: Payer: Self-pay | Admitting: Cardiovascular Disease

## 2016-10-19 DIAGNOSIS — I5042 Chronic combined systolic (congestive) and diastolic (congestive) heart failure: Secondary | ICD-10-CM

## 2016-10-19 MED ORDER — FUROSEMIDE 40 MG PO TABS: 40.0000 mg | ORAL_TABLET | Freq: Every day | ORAL | 0 refills | Status: DC

## 2016-10-19 NOTE — Telephone Encounter (Signed)
Refill sent to the pharmacy electronically.  Follow up scheduled

## 2016-10-19 NOTE — Telephone Encounter (Signed)
°*  STAT* If patient is at the pharmacy, call can be transferred to refill team.   1. Which medications need to be refilled? (please list name of each medication and dose if known) *Furosemide  2. Which pharmacy/location (including street and city if local pharmacy) is medication to be sent to?[facility] [phone] 3. Do they need a 30 day or 90 day supply?60 and refills

## 2016-10-21 ENCOUNTER — Other Ambulatory Visit: Payer: Self-pay | Admitting: Cardiovascular Disease

## 2016-10-21 ENCOUNTER — Ambulatory Visit: Payer: Self-pay

## 2016-10-21 DIAGNOSIS — I5042 Chronic combined systolic (congestive) and diastolic (congestive) heart failure: Secondary | ICD-10-CM

## 2016-10-21 MED ORDER — FUROSEMIDE 40 MG PO TABS: 40.0000 mg | ORAL_TABLET | Freq: Every day | ORAL | 2 refills | Status: DC

## 2016-10-21 NOTE — Telephone Encounter (Signed)
Phone in Rx for lasix 40mg  QD to [place]

## 2016-10-21 NOTE — Telephone Encounter (Signed)
New Message   pt verbalized that she is returning call for rn  [phone] to speak to Curt Bears)  He said that call was dropped

## 2016-11-02 ENCOUNTER — Ambulatory Visit (INDEPENDENT_AMBULATORY_CARE_PROVIDER_SITE_OTHER): Payer: Self-pay | Admitting: Internal Medicine

## 2016-11-02 ENCOUNTER — Encounter: Payer: Self-pay | Admitting: Internal Medicine

## 2016-11-02 VITALS — BP 147/104 | HR 98 | Temp 97.7°F | Ht 73.0 in | Wt 288.7 lb

## 2016-11-02 DIAGNOSIS — R3916 Straining to void: Secondary | ICD-10-CM

## 2016-11-02 DIAGNOSIS — Z79899 Other long term (current) drug therapy: Secondary | ICD-10-CM

## 2016-11-02 DIAGNOSIS — E559 Vitamin D deficiency, unspecified: Secondary | ICD-10-CM

## 2016-11-02 DIAGNOSIS — E1142 Type 2 diabetes mellitus with diabetic polyneuropathy: Secondary | ICD-10-CM

## 2016-11-02 DIAGNOSIS — I10 Essential (primary) hypertension: Secondary | ICD-10-CM

## 2016-11-02 DIAGNOSIS — Z794 Long term (current) use of insulin: Secondary | ICD-10-CM

## 2016-11-02 DIAGNOSIS — R079 Chest pain, unspecified: Secondary | ICD-10-CM

## 2016-11-02 DIAGNOSIS — R1031 Right lower quadrant pain: Secondary | ICD-10-CM

## 2016-11-02 DIAGNOSIS — Z1159 Encounter for screening for other viral diseases: Secondary | ICD-10-CM

## 2016-11-02 HISTORY — DX: Straining to void: R39.16

## 2016-11-02 LAB — POCT GLYCOSYLATED HEMOGLOBIN (HGB A1C): Hemoglobin A1C: 8.8

## 2016-11-02 LAB — GLUCOSE, CAPILLARY: GLUCOSE-CAPILLARY: 108 mg/dL — AB (ref 65–99)

## 2016-11-02 MED ORDER — ISOSORBIDE MONONITRATE ER 30 MG PO TB24: 30.0000 mg | ORAL_TABLET | Freq: Every day | ORAL | 6 refills | Status: DC

## 2016-11-02 MED ORDER — ISOSORBIDE MONONITRATE ER 30 MG PO TB24: 30.0000 mg | ORAL_TABLET | Freq: Every day | ORAL | 0 refills | Status: DC

## 2016-11-02 NOTE — Patient Instructions (Signed)
For your urinary issues, we will check bloodwork today. If anything comes back abnormal, we will call you. Please come back in the next few weeks if this pain happens again as it could be problems with your prostate.  For your chest pain, I refilled Imdur for 30 days. Please see your cardiologist next month for more refills.   It's been a pleasure seeing you, and I wish you well.

## 2016-11-02 NOTE — Assessment & Plan Note (Addendum)
Assessment He is agreeable to HCV screening. He denies a prior history of IV drug use or receiving blood products.  Plan -Check HCV  ADDENDUM 11/06/2016  11:13 AM:  HCV also negative. I reviewed these results over the phone.

## 2016-11-02 NOTE — Progress Notes (Signed)
   CC: right groin pain  HPI:  [name] is a 61 y.o. who presents today for right groin pain. Please see assessment & plan for status of chronic medical problems.   Past Medical History:  Diagnosis Date  . Abscess of groin   . Abscessed tooth   . Allergic rhinitis   . BPH (benign prostatic hypertrophy)    Massive BPH noted on cystoscopy 1/23/ 2012 by Dr. Risa Grill.  . Cardiomegaly    Mild by CXR 11/04/04  . Diabetes mellitus 04/08/2008  . Headache(784.0)   . Hyperlipemia   . Hypertension   . Hypertensive cardiopathy 03/01/2006   2-D echocardiogram 02/01/2012 showed moderate LVH, mildly to moderately reduced left ventricular systolic function with an estimated ejection fraction of 40-45%, and diffuse hypokinesis.  A nuclear medicine stress study done 01/31/2012 showed no reversible ischemia, a small mid anterior wall fixed defect/infarct, and ejection fraction 42%.      . Neck pain   . Obstructive sleep apnea 03/06/2008   Sleep study 03/06/08 showed severe OSA/hypopnea syndrome, with successful CPAP titration to 13 CWP using a medium ResMed Mirage Quattro full face mask with heated humidifier.   . Renal calculus 05/29/2010   CT scan of abdomen/pelvis on 05/29/2010 showed an obstructing approximate 1-2 mm calculus at the left UVJ, and an approximate 1-2 mm left lower pole renal calculus.   Patient had continuing severe pain , and an elevation of his serum creatinine to a value of 1.75 on 06/06/2010.  The stone had apparently passed and was not seen on repeat CT 06/08/2010.    Review of Systems:  Please see each problem below for a pertinent review of systems.  Physical Exam:  Vitals:   11/02/16 0850  BP: (!) 147/104  Pulse: 98  Temp: 97.7 F (36.5 C)  TempSrc: Oral  SpO2: 97%  Weight: 288 lb 11.2 oz (131 kg)  Height: 6\' 1"  (1.854 m)   Physical Exam  Constitutional: He is oriented to person, place, and time. No distress.  HENT:  Head: Normocephalic and atraumatic.    Eyes: Conjunctivae are normal. No scleral icterus.  Pulmonary/Chest: Effort normal. No respiratory distress.  Abdominal: There is tenderness (Mildly tender to palpation overlying right lower quadrant).  Musculoskeletal: He exhibits tenderness (Right mid groin alongside the mid-inguinal crease.).  No CVA tenderness.  Neurological: He is alert and oriented to person, place, and time.  Skin: He is not diaphoretic.   Assessment & Plan:   See Encounters Tab for problem based charting.  Patient discussed with Dr. Lynnae January

## 2016-11-02 NOTE — Assessment & Plan Note (Signed)
Assessment A1c 8.8, improved from 12.7 at 3 months ago. He acknowledges taking glargine 56 units in the morning, empagliflozin 10 mg, and metformin 1000 mg twice daily though has missed several doses recently. He forgot his glucometer this morning though recalls his sugars have been in the low 100s prior to taking glargine.  Given overall functional status and other co-morbidities, glycemic target of A1c <7 is appropriate, and he has made a lot of progress on therapy alone.  Plan -Continue glargine 56 units in the morning, empagliflozin 10 mg, and metformin 1000 mg twice daily. -Consider increasing empagliflozin at follow-up pending BMET

## 2016-11-02 NOTE — Assessment & Plan Note (Signed)
Assessment His blood pressure is 147/104, and he acknowledges not taking his medications yesterday given how he felt.   Plan -Continue amlodipine/olmesartan 5/40 mg, carvedilol 37.5 mg twice daily

## 2016-11-02 NOTE — Assessment & Plan Note (Addendum)
Assessment His Vitamin D resulted <4 in February, and he acknowledges completing high dose supplementation.  Plan -Recheck vitamin D  ADDENDUM 11/06/2016  11:14 AM:  Vit D 14.6 consistent with insufficiency though improved from 4 months ago.  I reviewed these results over the phone, and he is agreeable to starting Vitamin D supplementation.

## 2016-11-02 NOTE — Assessment & Plan Note (Addendum)
Assessment Yesterday, he had difficulty urinating and associated pain in his groin and testicles. When he could finally urinate, he felt he could not empty his bladder completely which caused him to have to use the restroom several times yesterday. He feels better today and has had no difficulty with urinating. He denied any dysuria, fever, vomiting, changes in bowels, prior history of similar symptoms.   I am puzzled by the acute nature of his symptoms. 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 I do not see any anticholinergics which could be contributory. Though BPH is listed on his problem list, acuity would be unusual. He has had kidney stones in the past and does not feel these symptoms resembled them at all.   Plan -Check BMET today to assess for any changes in kidney function which might suggest post-renal process -Check urinalysis to assess for abnormal sediment -Provided return precautions to return sooner  ADDENDUM 11/06/2016  11:14 AM:  Urinalysis notable only for proteinuria and glucosuria which is consistent with his history of poorly controlled diabetes. No hematuria or pyuria. BMET with Crt 1.1 consistent with baseline 1.0-1.2, consistent with stable renal function.   I reviewed these results over the phone.

## 2016-11-02 NOTE — Assessment & Plan Note (Signed)
Assessment He is out of Imdur and is scheduled to see his cardiologist next month.   Plan -Refill Imdur 30 mg for a 30-day supply until he can follow-up for additional refills

## 2016-11-03 LAB — BMP8+ANION GAP
ANION GAP: 16 mmol/L (ref 10.0–18.0)
BUN/Creatinine Ratio: 8 — ABNORMAL LOW (ref 9–20)
BUN: 9 mg/dL (ref 6–24)
CHLORIDE: 98 mmol/L (ref 96–106)
CO2: 26 mmol/L (ref 20–29)
Calcium: 9.2 mg/dL (ref 8.7–10.2)
Creatinine, Ser: 1.13 mg/dL (ref 0.76–1.27)
GFR calc Af Amer: 82 mL/min/{1.73_m2} (ref >59)
GFR calc non Af Amer: 71 mL/min/{1.73_m2} (ref >59)
GLUCOSE: 94 mg/dL (ref 65–99)
Potassium: 3.8 mmol/L (ref 3.5–5.2)
SODIUM: 140 mmol/L (ref 134–144)

## 2016-11-03 LAB — MICROSCOPIC EXAMINATION
Bacteria, UA: NONE SEEN
Casts: NONE SEEN /lpf
Epithelial Cells (non renal): NONE SEEN /hpf (ref 0–10)
RBC, UA: NONE SEEN /hpf (ref 0–?)

## 2016-11-03 LAB — URINALYSIS, ROUTINE W REFLEX MICROSCOPIC
Bilirubin, UA: NEGATIVE
Ketones, UA: NEGATIVE
LEUKOCYTES UA: NEGATIVE
Nitrite, UA: NEGATIVE
RBC UA: NEGATIVE
Specific Gravity, UA: 1.019 (ref 1.005–1.030)
Urobilinogen, Ur: 1 mg/dL (ref 0.2–1.0)
pH, UA: 6 (ref 5.0–7.5)

## 2016-11-03 LAB — VITAMIN D 25 HYDROXY (VIT D DEFICIENCY, FRACTURES): Vit D, 25-Hydroxy: 14.6 ng/mL — ABNORMAL LOW (ref 30.0–100.0)

## 2016-11-03 LAB — HEPATITIS C ANTIBODY: Hep C Virus Ab: <0.1 s/co ratio (ref 0.0–0.9)

## 2016-11-03 NOTE — Progress Notes (Signed)
Internal Medicine Clinic Attending  Case discussed with Dr. Patel,Rushil at the time of the visit.  We reviewed the resident's history and exam and pertinent patient test results.  I agree with the assessment, diagnosis, and plan of care documented in the resident's note.  

## 2016-11-06 MED ORDER — CHOLECALCIFEROL 50 MCG (2000 UT) PO TABS: 2000.0000 [IU] | ORAL_TABLET | Freq: Every day | ORAL | 3 refills | Status: DC

## 2016-11-06 NOTE — Addendum Note (Signed)
Addended by: Riccardo Dubin on: 11/06/2016 11:49 AM   Modules accepted: Orders

## 2016-11-21 ENCOUNTER — Emergency Department (HOSPITAL_COMMUNITY)
Admission: EM | Admit: 2016-11-21 | Discharge: 2016-11-21 | Disposition: A | Discharge status: Home / Self Care | Payer: Self-pay | Attending: Emergency Medicine | Admitting: Emergency Medicine

## 2016-11-21 ENCOUNTER — Ambulatory Visit (HOSPITAL_COMMUNITY)
Admission: EM | Admit: 2016-11-21 | Discharge: 2016-11-21 | Disposition: A | Discharge status: Nursing Home (Includes ICF) | Payer: Self-pay

## 2016-11-21 ENCOUNTER — Encounter (HOSPITAL_COMMUNITY): Payer: Self-pay | Admitting: Emergency Medicine

## 2016-11-21 DIAGNOSIS — E119 Type 2 diabetes mellitus without complications: Secondary | ICD-10-CM | POA: Insufficient documentation

## 2016-11-21 DIAGNOSIS — I10 Essential (primary) hypertension: Secondary | ICD-10-CM | POA: Insufficient documentation

## 2016-11-21 DIAGNOSIS — I509 Heart failure, unspecified: Secondary | ICD-10-CM | POA: Insufficient documentation

## 2016-11-21 DIAGNOSIS — R338 Other retention of urine: Secondary | ICD-10-CM | POA: Insufficient documentation

## 2016-11-21 DIAGNOSIS — R339 Retention of urine, unspecified: Secondary | ICD-10-CM

## 2016-11-21 DIAGNOSIS — Z7984 Long term (current) use of oral hypoglycemic drugs: Secondary | ICD-10-CM | POA: Insufficient documentation

## 2016-11-21 DIAGNOSIS — Z79899 Other long term (current) drug therapy: Secondary | ICD-10-CM | POA: Insufficient documentation

## 2016-11-21 LAB — URINALYSIS, ROUTINE W REFLEX MICROSCOPIC
BILIRUBIN URINE: NEGATIVE
Bacteria, UA: NONE SEEN
KETONES UR: NEGATIVE mg/dL
Leukocytes, UA: NEGATIVE
NITRITE: NEGATIVE
PROTEIN: NEGATIVE mg/dL
Specific Gravity, Urine: 1.006 (ref 1.005–1.030)
Squamous Epithelial / LPF: NONE SEEN
pH: 5 (ref 5.0–8.0)

## 2016-11-21 LAB — I-STAT CHEM 8, ED
BUN: 12 mg/dL (ref 6–20)
CALCIUM ION: 1.04 mmol/L — AB (ref 1.15–1.40)
Chloride: 108 mmol/L (ref 101–111)
Creatinine, Ser: 1.1 mg/dL (ref 0.61–1.24)
GLUCOSE: 147 mg/dL — AB (ref 65–99)
HCT: 54 % — ABNORMAL HIGH (ref 39.0–52.0)
HEMOGLOBIN: 18.4 g/dL — AB (ref 13.0–17.0)
Potassium: 4 mmol/L (ref 3.5–5.1)
Sodium: 142 mmol/L (ref 135–145)
TCO2: 22 mmol/L (ref 0–100)

## 2016-11-21 LAB — CBG MONITORING, ED: Glucose-Capillary: 164 mg/dL — ABNORMAL HIGH (ref 65–99)

## 2016-11-21 NOTE — ED Provider Notes (Signed)
[place] DEPT Provider Note   CSN: [id number] Arrival date & time: 11/21/16  1316     History   Chief Complaint Chief Complaint  Patient presents with  . Abdominal Pain    The patient is complaining of pressure in his abdomen when he urinates.  Patient says his urine is clear and yellow no odor but he does have to strain to urinate.    HPI Terry Parks is a 61 y.o. male.  HPI 61 year old gentleman with a history of BPH, who presents to the ED with difficulty voiding since this morning and suprapubic distention and discomfort. Patient reports that he's had similar episodes in the past that it lasted for approximately 1 day and have resolved the following day. Patient reports that he's been evaluated by his primary care provider who checked his kidney function and his prostate and reported that everything was "okay." Reports that he has a past history of kidney stones but this does not feel like his kidney stone. Denies any other physical complaints such as nausea, vomiting, diarrhea. Denies any dysuria or fevers. No alleviating or aggravating factors.  Past Medical History:  Diagnosis Date  . Abscess of groin   . Abscessed tooth   . Allergic rhinitis   . BPH (benign prostatic hypertrophy)    Massive BPH noted on cystoscopy 1/23/ 2012 by Dr. Risa Grill.  . Cardiomegaly    Mild by CXR 11/04/04  . Diabetes mellitus 04/08/2008  . Headache(784.0)   . Hyperlipemia   . Hypertension   . Hypertensive cardiopathy 03/01/2006   2-D echocardiogram 02/01/2012 showed moderate LVH, mildly to moderately reduced left ventricular systolic function with an estimated ejection fraction of 40-45%, and diffuse hypokinesis.  A nuclear medicine stress study done 01/31/2012 showed no reversible ischemia, a small mid anterior wall fixed defect/infarct, and ejection fraction 42%.      . Neck pain   . Obstructive sleep apnea 03/06/2008   Sleep study 03/06/08 showed severe OSA/hypopnea syndrome, with  successful CPAP titration to 13 CWP using a medium ResMed Mirage Quattro full face mask with heated humidifier.   . Renal calculus 05/29/2010   CT scan of abdomen/pelvis on 05/29/2010 showed an obstructing approximate 1-2 mm calculus at the left UVJ, and an approximate 1-2 mm left lower pole renal calculus.   Patient had continuing severe pain , and an elevation of his serum creatinine to a value of 1.75 on 06/06/2010.  The stone had apparently passed and was not seen on repeat CT 06/08/2010.    Patient Active Problem List   Diagnosis Date Noted  . Urinary straining 11/02/2016  . Encounter for HCV screening test for low risk patient 11/02/2016  . Chest pain 11/02/2016  . Vitamin D deficiency 06/22/2016  . Neuropathy in diabetes ([place]) 10/07/2015  . Erectile dysfunction 04/17/2014  . Rash 04/17/2014  . Allergic rhinitis 10/19/2012  . Diabetic retinopathy ([place]) 10/04/2012  . BPH   . Nephrolithiasis 05/29/2010  . Type 2 diabetes mellitus with peripheral neuropathy ([place]) 04/08/2008  . Obstructive sleep apnea   . Morbid obesity ([place])   . Hyperlipidemia associated with type 2 diabetes mellitus ([place])   . Essential hypertension   . Chronic combined systolic and diastolic CHF (congestive heart failure) ([place]) 03/01/2006    Past Surgical History:  Procedure Laterality Date  . CYSTOSCOPY W/ RETROGRADES         Home Medications    Prior to Admission medications   Medication Sig Start Date End Date Taking? Authorizing Provider  amLODipine-olmesartan (AZOR) 5-40 MG tablet Take 1 tablet by mouth daily. 06/22/16   Milagros Loll, MD  Armodafinil (NUVIGIL) 150 MG tablet Take 1 tablet (150 mg total) by mouth daily. As needed. 06/12/15   Troy Sine, MD  aspirin 81 MG EC tablet Take 81 mg by mouth daily.     [provider]  atorvastatin (LIPITOR) 40 MG tablet Take 1 tablet (40 mg total) by mouth daily. 10/07/15   Riccardo Dubin, MD  carvedilol (COREG) 25 MG tablet Take 1.5 tablets  (37.5 mg total) by mouth 2 (two) times daily. 10/07/15   Riccardo Dubin, MD  Cholecalciferol 2000 units TABS Take 1 tablet (2,000 Units total) by mouth daily. 11/06/16   Riccardo Dubin, MD  empagliflozin (JARDIANCE) 10 MG TABS tablet Take 10 mg by mouth daily. 10/07/15   Riccardo Dubin, MD  furosemide (LASIX) 40 MG tablet Take 1 tablet (40 mg total) by mouth daily. 10/21/16 10/21/17  Troy Sine, MD  glucose blood (TRUETRACK TEST) test strip Use as directed to test blood sugar three times a day before meals. 10/04/12   Bertha Stakes, MD  Insulin Glargine (LANTUS SOLOSTAR) 100 UNIT/ML Solostar Pen Inject 56 Units into the skin daily. 06/22/16   Milagros Loll, MD  Ipratropium-Albuterol (COMBIVENT) 20-100 MCG/ACT AERS respimat Inhale 1 puff into the lungs every 6 (six) hours as needed for wheezing or shortness of breath. 01/18/14   Oval Linsey, MD  isosorbide mononitrate (IMDUR) 30 MG 24 hr tablet Take 1 tablet (30 mg total) by mouth daily. 11/02/16   Riccardo Dubin, MD  Lancets MISC Use to test blood sugar three times a day before meals. 09/14/13   Malena Catholic, MD  metFORMIN (GLUCOPHAGE) 1000 MG tablet Take 1 tablet (1,000 mg total) by mouth 2 (two) times daily with a meal. 07/20/16   Dellia Nims, MD    Family History Family History  Problem Relation Age of Onset  . Breast cancer Mother   . Hypertension Father   . Diabetes Maternal Grandmother   . Colon cancer Neg Hx   . Prostate cancer Neg Hx   . Heart attack Neg Hx     Social History Social History  Substance Use Topics  . Smoking status: Never Smoker  . Smokeless tobacco: Never Used  . Alcohol use No     Allergies   Lisinopril   Review of Systems Review of Systems All other systems are reviewed and are negative for acute change except as noted in the HPI   Physical Exam Updated Vital Signs BP (!) 178/108 (BP Location: Left Arm)   Pulse (!) 115   Temp 97.6 F (36.4 C) (Oral)   Resp 19   Ht 6\' 1"  (1.854 m)    Wt 129.3 kg (285 lb)   SpO2 100%   BMI 37.60 kg/m   Physical Exam  Constitutional: He is oriented to person, place, and time. He appears well-developed and well-nourished. No distress.  HENT:  Head: Normocephalic and atraumatic.  Right Ear: External ear normal.  Left Ear: External ear normal.  Nose: Nose normal.  Mouth/Throat: Mucous membranes are normal. No trismus in the jaw.  Eyes: Conjunctivae and EOM are normal. No scleral icterus.  Neck: Normal range of motion and phonation normal.  Cardiovascular: Regular rhythm.  Tachycardia present.   Pulmonary/Chest: Effort normal. No stridor. No respiratory distress.  Abdominal: He exhibits distension (suprapubic). There is tenderness ( Discomfort) in the suprapubic area. There is  no rigidity, no rebound and no guarding.  Musculoskeletal: Normal range of motion. He exhibits no edema.  Neurological: He is alert and oriented to person, place, and time.  Skin: He is not diaphoretic.  Psychiatric: He has a normal mood and affect. His behavior is normal.  Vitals reviewed.    ED Treatments / Results  Labs (all labs ordered are listed, but only abnormal results are displayed) Labs Reviewed  URINALYSIS, ROUTINE W REFLEX MICROSCOPIC - Abnormal; Notable for the following:       Result Value   Color, Urine COLORLESS (*)    Glucose, UA >=500 (*)    Hgb urine dipstick MODERATE (*)    All other components within normal limits  CBG MONITORING, ED - Abnormal; Notable for the following:    Glucose-Capillary 164 (*)    All other components within normal limits  LIPASE, BLOOD  COMPREHENSIVE METABOLIC PANEL  CBC  CBG MONITORING, ED    EKG  EKG Interpretation None       Radiology No results found.  Procedures Procedures (including critical care time)  Medications Ordered in ED Medications - No data to display   Initial Impression / Assessment and Plan / ED Course  I have reviewed the triage vital signs and the nursing  notes.  Pertinent labs & imaging results that were available during my care of the patient were reviewed by me and considered in my medical decision making (see chart for details).     Bladder scan with more than 430 mL in the bladder. Consistent with urinary retention. UA without evidence of infection.   Discussed the need for Foley however patient declined the indwelling Foley. Will perform an in and out catheterization inject renal function. Patient was instructed to return to the emergency department if his symptoms continued at which time he will require an indwelling catheter. Patient was in agreement with this plan.   Patient care turned over to Dr Johnney Killian at 1600. Patient case and results discussed in detail; please see their note for further ED managment.       Fatima Blank, MD 11/21/16 1623

## 2016-11-21 NOTE — ED Notes (Signed)
Pt     Is  Diabetic  Did  Not  Take   meds    Today  Has  Frequent  Urination     Awake    And  Alert  Dr  Ralene Cork   Examined  Pt   Send  To  [facility]   Wife  Will  Take  Him

## 2016-11-21 NOTE — ED Triage Notes (Signed)
The patient is complaining of pressure in his abdomen when he urinates.  Patient says his urine is clear and yellow no odor but he does have to strain to urinate.  The patient is having urgency to urinate.

## 2016-11-22 ENCOUNTER — Ambulatory Visit: Payer: Self-pay | Admitting: Cardiovascular Disease

## 2016-11-22 ENCOUNTER — Encounter: Payer: Self-pay | Admitting: *Deleted

## 2016-12-27 ENCOUNTER — Telehealth: Payer: Self-pay | Admitting: *Deleted

## 2016-12-28 ENCOUNTER — Other Ambulatory Visit: Payer: Self-pay | Admitting: *Deleted

## 2016-12-28 DIAGNOSIS — E785 Hyperlipidemia, unspecified: Secondary | ICD-10-CM

## 2016-12-28 DIAGNOSIS — E1159 Type 2 diabetes mellitus with other circulatory complications: Secondary | ICD-10-CM

## 2016-12-28 DIAGNOSIS — E1169 Type 2 diabetes mellitus with other specified complication: Secondary | ICD-10-CM

## 2016-12-28 DIAGNOSIS — I152 Hypertension secondary to endocrine disorders: Secondary | ICD-10-CM

## 2016-12-28 DIAGNOSIS — I10 Essential (primary) hypertension: Principal | ICD-10-CM

## 2016-12-29 MED ORDER — CARVEDILOL 25 MG PO TABS: 37.5000 mg | ORAL_TABLET | Freq: Two times a day (BID) | ORAL | 3 refills | Status: DC

## 2016-12-29 MED ORDER — ATORVASTATIN CALCIUM 40 MG PO TABS: 40.0000 mg | ORAL_TABLET | Freq: Every day | ORAL | 0 refills | Status: DC

## 2016-12-29 NOTE — Telephone Encounter (Signed)
Review for pharm and refills

## 2016-12-30 ENCOUNTER — Other Ambulatory Visit: Payer: Self-pay | Admitting: *Deleted

## 2016-12-30 DIAGNOSIS — E1142 Type 2 diabetes mellitus with diabetic polyneuropathy: Secondary | ICD-10-CM

## 2016-12-31 MED ORDER — DULOXETINE HCL 60 MG PO CPEP: 60.0000 mg | ORAL_CAPSULE | Freq: Every day | ORAL | 2 refills | Status: DC

## 2016-12-31 NOTE — Telephone Encounter (Signed)
Feb note indicated it was stopped bc "not tolerating" it. I called pt and he has been taking it BID and running out. Tolerating it well, is helping. Explained I would fil lit for QD (rec dose) and sch appt.  PLS assign PCP  Pls sch DM F/U appt Sept / Oct

## 2017-03-01 ENCOUNTER — Other Ambulatory Visit: Payer: Self-pay | Admitting: Internal Medicine

## 2017-03-01 MED ORDER — INSULIN PEN NEEDLE 31G X 6 MM MISC: 1.0000 "pen " | Freq: Every day | 3 refills | Status: DC

## 2017-03-01 NOTE — Telephone Encounter (Signed)
Patient needs a refill on pin needles, pls call pt

## 2017-03-01 NOTE — Telephone Encounter (Signed)
Spoke with patient, he will come in tomorrow to obtain pen needles (samples).  He has an appt with his pcp on 10/26, therefore he will address changing to levemir during that visit.Despina Hidden Cassady10/16/20181:48 PM

## 2017-03-01 NOTE — Telephone Encounter (Signed)
Pt requesting rx for pen needles to be sent to his pharmacy.  He has [facility] card which will not cover the cost of pen needles at [facility] and they are unavailable through the [facility], unless he is on Levemir. Will call patient back this afternoon with options and to see if we can provide him with samples.Despina Hidden Cassady10/16/201811:55 AM

## 2017-03-11 ENCOUNTER — Encounter: Payer: Self-pay | Admitting: Internal Medicine

## 2017-03-11 ENCOUNTER — Ambulatory Visit (INDEPENDENT_AMBULATORY_CARE_PROVIDER_SITE_OTHER): Payer: Self-pay | Admitting: Internal Medicine

## 2017-03-11 VITALS — BP 164/102 | HR 100 | Temp 98.2°F | Ht 73.0 in | Wt 301.9 lb

## 2017-03-11 DIAGNOSIS — R059 Cough, unspecified: Secondary | ICD-10-CM

## 2017-03-11 DIAGNOSIS — Z79899 Other long term (current) drug therapy: Secondary | ICD-10-CM

## 2017-03-11 DIAGNOSIS — I5042 Chronic combined systolic (congestive) and diastolic (congestive) heart failure: Secondary | ICD-10-CM

## 2017-03-11 DIAGNOSIS — Z9114 Patient's other noncompliance with medication regimen: Secondary | ICD-10-CM

## 2017-03-11 DIAGNOSIS — E785 Hyperlipidemia, unspecified: Secondary | ICD-10-CM

## 2017-03-11 DIAGNOSIS — I10 Essential (primary) hypertension: Secondary | ICD-10-CM

## 2017-03-11 DIAGNOSIS — G4733 Obstructive sleep apnea (adult) (pediatric): Secondary | ICD-10-CM

## 2017-03-11 DIAGNOSIS — I110 Hypertensive heart disease with heart failure: Secondary | ICD-10-CM

## 2017-03-11 DIAGNOSIS — Z794 Long term (current) use of insulin: Secondary | ICD-10-CM

## 2017-03-11 DIAGNOSIS — R002 Palpitations: Secondary | ICD-10-CM

## 2017-03-11 DIAGNOSIS — F339 Major depressive disorder, recurrent, unspecified: Secondary | ICD-10-CM

## 2017-03-11 DIAGNOSIS — R51 Headache: Secondary | ICD-10-CM

## 2017-03-11 DIAGNOSIS — R350 Frequency of micturition: Secondary | ICD-10-CM

## 2017-03-11 DIAGNOSIS — R0602 Shortness of breath: Secondary | ICD-10-CM

## 2017-03-11 DIAGNOSIS — E1169 Type 2 diabetes mellitus with other specified complication: Secondary | ICD-10-CM

## 2017-03-11 DIAGNOSIS — N401 Enlarged prostate with lower urinary tract symptoms: Secondary | ICD-10-CM

## 2017-03-11 DIAGNOSIS — H538 Other visual disturbances: Secondary | ICD-10-CM

## 2017-03-11 DIAGNOSIS — E1142 Type 2 diabetes mellitus with diabetic polyneuropathy: Secondary | ICD-10-CM

## 2017-03-11 DIAGNOSIS — E559 Vitamin D deficiency, unspecified: Secondary | ICD-10-CM

## 2017-03-11 DIAGNOSIS — R05 Cough: Secondary | ICD-10-CM

## 2017-03-11 LAB — GLUCOSE, CAPILLARY: GLUCOSE-CAPILLARY: 180 mg/dL — AB (ref 65–99)

## 2017-03-11 LAB — POCT GLYCOSYLATED HEMOGLOBIN (HGB A1C): Hemoglobin A1C: 7.8

## 2017-03-11 NOTE — Assessment & Plan Note (Signed)
Patient's blood pressure during this visit was 164/102, 100 and repeat was 165/110. The patient is currently on amlodipine olmesartan 5-40 mg daily, carvedilol 25 mg daily, Imdur 30 mg once daily, Lasix 40 mg once daily.  -Increase carvedilol to from 25 mg daily to 50 mg daily

## 2017-03-11 NOTE — Progress Notes (Addendum)
    CC: Diabetes follow-up  HPI:  [name] is a 61 y.o. past medical history of essential hypertension, chronic combined systolic and diastolic heart failure, obstructive sleep apnea, type 2 diabetes with peripheral neuropathy, hyperlipidemia, and BPH.    Past Medical History:  Diagnosis Date  . Abscess of groin   . Abscessed tooth   . Allergic rhinitis   . BPH (benign prostatic hypertrophy)    Massive BPH noted on cystoscopy 1/23/ 2012 by Dr. [name].  . Cardiomegaly    Mild by CXR 11/04/04  . Diabetes mellitus 04/08/2008  . Headache(784.0)   . Hyperlipemia   . Hypertension   . Hypertensive cardiopathy 03/01/2006   2-D echocardiogram 02/01/2012 showed moderate LVH, mildly to moderately reduced left ventricular systolic function with an estimated ejection fraction of 40-45%, and diffuse hypokinesis.  A nuclear medicine stress study done 01/31/2012 showed no reversible ischemia, a small mid anterior wall fixed defect/infarct, and ejection fraction 42%.      . Neck pain   . Obstructive sleep apnea 03/06/2008   Sleep study 03/06/08 showed severe OSA/hypopnea syndrome, with successful CPAP titration to 13 CWP using a medium ResMed Mirage Quattro full face mask with heated humidifier.   . Renal calculus 05/29/2010   CT scan of abdomen/pelvis on 05/29/2010 showed an obstructing approximate 1-2 mm calculus at the left UVJ, and an approximate 1-2 mm left lower pole renal calculus.   Patient had continuing severe pain , and an elevation of his serum creatinine to a value of 1.75 on 06/06/2010.  The stone had apparently passed and was not seen on repeat CT 06/08/2010.   Review of Systems:    Review of Systems  Constitutional: Negative for chills and fever.  Eyes: Positive for blurred vision.  Respiratory: Positive for shortness of breath.   Cardiovascular: Positive for palpitations.  Gastrointestinal: Negative for abdominal pain, nausea and vomiting.  Genitourinary: Positive for  frequency.  Neurological: Positive for headaches. Negative for dizziness.   Physical Exam:  There were no vitals filed for this visit.  Physical Exam  Constitutional: He appears well-developed and well-nourished. No distress.  HENT:  Head: Normocephalic and atraumatic.  Eyes: Conjunctivae are normal.  Cardiovascular: Normal rate, regular rhythm and normal heart sounds.   Pulmonary/Chest: Effort normal and breath sounds normal. No respiratory distress. He has no wheezes.  Abdominal: Soft. Bowel sounds are normal. He exhibits no distension. There is no tenderness.  Musculoskeletal: He exhibits no edema.  Psychiatric: He has a normal mood and affect. His behavior is normal. Judgment and thought content normal.   Assessment & Plan:   See Encounters Tab for problem based charting.  Patient seen with Dr. Lynnae January

## 2017-03-11 NOTE — Assessment & Plan Note (Signed)
The patient's vitamin D level on 11/01/2016 was 14.6.  He is currently on 2000 units cholecalciferol daily -Instructed the patient to D discontinue vitamin D use to limit the number of pills he has to take daily.  I feel that the patient is adequately repleted off his vitamin D.

## 2017-03-11 NOTE — Assessment & Plan Note (Signed)
The patient was prescribed Combivent in 2014 for a cough that was thought to be multifactorial with a component of systolic diastolic heart failure.  Pulmonary function tests were ordered but the patient did not get them done.  The patient states that he rarely uses his Combivent.  -Discontinue Combivent -Recommend pursuing PFTs at a later visit

## 2017-03-11 NOTE — Patient Instructions (Signed)
It was a pleasure to see you today Terry Parks. Please make the following changes:  -Follow a low carb low sugar diet -exercise at least 30 min daily -please take all your medication without any missed doses -follow up in 1 month to re-evaluate blood glucose and blood pressure. Bring your medication and glucose monitor during that visit   If you have any questions or concerns, please call our clinic at [phone] between 9am-5pm and after hours call [phone] and ask for the internal medicine resident on call. If you feel you are having a medical emergency please call 911.   Thank you, we look forward to help you remain healthy!  [name], MD Internal Medicine PGY1

## 2017-03-11 NOTE — Assessment & Plan Note (Signed)
The patient is on metformin 1000mg  bid, Lantus 56 units before breakfast, Jardiance 10 mg qd.  The patient's last A1c was 8.8 on 11/02/2016.  The patient's A1c during this visit 03/11/2017 was 7.8.  Patient reports home blood glucose measurements ranging 100-300. His urinalysis showed 2+ protein on 11/02/2016.  Most recent creatinine = 1.1 on 11/21/16.   The patient has not been compliant with his medication.  He states that he has been noncompliant with metformin and Jardiance use but takes his Lantus 56 units daily.  He states that his medication noncompliance is due to decreased motivation and the excessive amount of pills he has to take daily.  -We will review his medication list and discontinue medications not necessary  The patient does not recall any hypoglycemic events. The patient has gained 15lbs since July 2018.   The patient states that he continues eat at fast food restaurants. He does not exercise.  -Recommended limiting his soda intake -Gave lengthy discussion about lifestyle modifications and diet -Due to patient's decrease in A1c since last visit we will continue on same regimen and recommend lifestyle modifications instead of pharmaceutical changes -We will follow-up in 1 month -Opthalmology referral -Dentistry referral  The patient states that he has been noticing decreased sensation in his feet along with numbness and tingling.  He was previously on gabapentin but the patient states that it was not effective and he would rather continue Cymbalta.  Due to his concomittant depression we will continue on Cymbalta 60 mg daily.

## 2017-03-11 NOTE — Assessment & Plan Note (Signed)
The patient's last lipid panel was on 10/07/2015 total cholesterol = 227 triglycerides = 188 HDL = 30 LDL = 159 ASCVD risk per this panel showed a 10-year risk of 3% and 5.3% with optimal risk factors.  Patient was placed on atorvastatin 40 mg daily.  -Continue atorvastatin 40 mg daily

## 2017-03-16 NOTE — Progress Notes (Signed)
Internal Medicine Clinic Attending  I saw and evaluated the patient.  I personally confirmed the key portions of the history and exam documented by Dr. [name] and I reviewed pertinent patient test results.  The assessment, diagnosis, and plan were formulated together and I agree with the documentation in the resident's note. 

## 2017-04-11 ENCOUNTER — Ambulatory Visit: Payer: Self-pay

## 2017-04-12 ENCOUNTER — Encounter: Payer: Self-pay | Admitting: Internal Medicine

## 2017-05-05 ENCOUNTER — Ambulatory Visit: Payer: Self-pay

## 2017-05-17 DIAGNOSIS — I499 Cardiac arrhythmia, unspecified: Secondary | ICD-10-CM

## 2017-05-17 HISTORY — DX: Cardiac arrhythmia, unspecified: I49.9

## 2017-05-20 ENCOUNTER — Other Ambulatory Visit: Payer: Self-pay | Admitting: *Deleted

## 2017-05-20 DIAGNOSIS — E1159 Type 2 diabetes mellitus with other circulatory complications: Secondary | ICD-10-CM

## 2017-05-20 DIAGNOSIS — I10 Essential (primary) hypertension: Secondary | ICD-10-CM

## 2017-05-20 DIAGNOSIS — E1142 Type 2 diabetes mellitus with diabetic polyneuropathy: Secondary | ICD-10-CM

## 2017-05-20 MED ORDER — CARVEDILOL 25 MG PO TABS: 25.0000 mg | ORAL_TABLET | Freq: Two times a day (BID) | ORAL | 1 refills | Status: DC

## 2017-05-20 MED ORDER — INSULIN GLARGINE 100 UNIT/ML SOLOSTAR PEN: 56.0000 [IU] | PEN_INJECTOR | Freq: Every day | SUBCUTANEOUS | 1 refills | Status: DC

## 2017-05-20 NOTE — Telephone Encounter (Signed)
Adjusted carvedilol dosage to 25mg  bid per my last note in October 2018

## 2017-05-30 ENCOUNTER — Other Ambulatory Visit: Payer: Self-pay

## 2017-05-30 ENCOUNTER — Ambulatory Visit (INDEPENDENT_AMBULATORY_CARE_PROVIDER_SITE_OTHER): Payer: Self-pay | Admitting: Internal Medicine

## 2017-05-30 ENCOUNTER — Encounter: Payer: Self-pay | Admitting: Internal Medicine

## 2017-05-30 VITALS — BP 137/87 | HR 79 | Temp 97.6°F | Ht 73.0 in | Wt 325.6 lb

## 2017-05-30 DIAGNOSIS — E1142 Type 2 diabetes mellitus with diabetic polyneuropathy: Secondary | ICD-10-CM

## 2017-05-30 DIAGNOSIS — Z6841 Body Mass Index (BMI) 40.0 and over, adult: Secondary | ICD-10-CM

## 2017-05-30 DIAGNOSIS — Z79899 Other long term (current) drug therapy: Secondary | ICD-10-CM

## 2017-05-30 DIAGNOSIS — I5042 Chronic combined systolic (congestive) and diastolic (congestive) heart failure: Secondary | ICD-10-CM

## 2017-05-30 MED ORDER — AMLODIPINE-OLMESARTAN 5-40 MG PO TABS: 1.0000 | ORAL_TABLET | Freq: Every day | ORAL | 1 refills | Status: DC

## 2017-05-30 MED ORDER — DULOXETINE HCL 60 MG PO CPEP: 60.0000 mg | ORAL_CAPSULE | Freq: Every day | ORAL | 1 refills | Status: DC

## 2017-05-30 NOTE — Progress Notes (Signed)
CC: Shortness of breath  HPI:  Mr.Terry Terry is a 62 y.o. male with PMHx detailed below presenting with increased dyspnea on exertion for the past 2 weeks or more. During this time he has also noticed increased distal leg pains and swelling. His weight is increased nearly 20lbs compared to late last year, although he does not check this regularly at home. He has run out of his duloxetine and amlodipine medications but otherwise denies missing doses of anything during the time. About two weeks ago he noticed some coughing and congestion but at this point has only infrequent and nonproductive coughing.  See problem based assessment and plan below for additional details.  Chronic combined systolic and diastolic CHF (congestive heart failure) (HCC) A: His current symptoms are mostly likely due to mild heart failure decompensation. He is taking lasix 40mg  daily but has definite evidence of fluid retention based on peripheral edema and weight gain. He does not have some findings such as JVD or inspiratory crackles although exam is challenging due to morbid obesity. There are no findings suggesting a primary respiratory process on exam. Of note it appears he was on Jardiance but the latest documented Rx would be completed months ago. This might be a good option for helping diabetes and fluid retention. P: Recommend he double daily lasix to 40mg  BID and follow up next week for symptom improvement. Bmet today to exclude new renal insufficiency as cause  Neuropathy in diabetes [facility]) He is interested in refilling his duloxetine for peripheral neuropathy. I believe his increased Parks may be more related to his increased edema than neuropathy today.  Reordered duloxetine 60mg  daily   Past Medical History:  Diagnosis Date  . Abscess of groin   . Abscessed tooth   . Allergic rhinitis   . BPH (benign prostatic hypertrophy)    Massive BPH noted on cystoscopy 1/23/ 2012 by Dr. Risa Terry.  .  Cardiomegaly    Mild by CXR 11/04/04  . Diabetes mellitus 04/08/2008  . Headache(784.0)   . Hyperlipemia   . Hypertension   . Hypertensive cardiopathy 03/01/2006   2-D echocardiogram 02/01/2012 showed moderate LVH, mildly to moderately reduced left ventricular systolic function with an estimated ejection fraction of 40-45%, and diffuse hypokinesis.  A nuclear medicine stress study done 01/31/2012 showed no reversible ischemia, a small mid anterior wall fixed defect/infarct, and ejection fraction 42%.      . Neck Parks   . Obstructive sleep apnea 03/06/2008   Sleep study 03/06/08 showed Terry Terry, with successful CPAP titration to 13 CWP using a medium ResMed Mirage Quattro full face mask with heated humidifier.   . Renal calculus 05/29/2010   CT scan of abdomen/pelvis on 05/29/2010 showed an obstructing approximate 1-2 mm calculus at the left UVJ, and an approximate 1-2 mm left lower pole renal calculus.   Patient had continuing Terry Terry , and an elevation of his serum creatinine to a value of 1.75 on 06/06/2010.  The stone had apparently passed and was not seen on repeat CT 06/08/2010.    Review of Systems: Review of Systems  Constitutional: Positive for chills. Negative for fever.  HENT: Negative for congestion and sore throat.   Eyes: Negative for blurred vision.  Respiratory: Positive for cough and shortness of breath.   Cardiovascular: Negative for chest Parks.  Gastrointestinal: Negative for abdominal Parks.  Genitourinary: Negative for dysuria and hematuria.  Musculoskeletal: Negative for falls.  Skin: Negative for rash.  Neurological: Negative for focal weakness and  weakness.     Physical Exam: Vitals:   05/30/17 1357 05/30/17 1405  BP: 137/87   Pulse: 79   Temp:  97.6 F (36.4 C)  TempSrc:  Oral  SpO2: 95%   Weight: (!) 325 lb 9.6 oz (147.7 kg)   Height: 6\' 1"  (1.854 m)    GENERAL- alert, co-operative, NAD HEENT- No oropharyngeal erythema, no sinus  tenderness CARDIAC- RRR, no murmurs, rubs or gallops. RESP- CTAB, no wheezes or crackles. ABDOMEN- No distention or tendernss EXTREMITIES- 2+ pitting edema in lower extremities about half way to knees bilaterally SKIN- Warm, dry, No rash or lesion. PSYCH- Normal mood and affect, appropriate thought content and speech.   Assessment & Plan:   See encounters tab for problem based medical decision making.   Patient discussed with Dr. Angelia Mould

## 2017-05-30 NOTE — Patient Instructions (Signed)
FOLLOW-UP INSTRUCTIONS When: Next week For: Shortness of breath What to bring: Medications  I think your shortness of breath is related to fluid build up. This also explains the weight change and increased leg swelling. The treatment for this is to use medication that will cause you to urinate more and remove the excess fluid.  I recommend doubling your fluid pill (furosemide) to twice daily in the morning and afternoon for the next week and seeing if the symptoms improve.  We will also check blood work today and make sure there is no underlying kidney problem causing fluid build up.

## 2017-05-31 LAB — BMP8+ANION GAP
Anion Gap: 13 mmol/L (ref 10.0–18.0)
BUN / CREAT RATIO: 8 — AB (ref 9–20)
BUN: 11 mg/dL (ref 6–24)
CO2: 23 mmol/L (ref 20–29)
CREATININE: 1.34 mg/dL — AB (ref 0.76–1.27)
Calcium: 8.8 mg/dL (ref 8.7–10.2)
Chloride: 107 mmol/L — ABNORMAL HIGH (ref 96–106)
GFR calc Af Amer: 67 mL/min/{1.73_m2} (ref >59)
GFR calc non Af Amer: 58 mL/min/{1.73_m2} — ABNORMAL LOW (ref >59)
GLUCOSE: 78 mg/dL (ref 65–99)
Potassium: 4.1 mmol/L (ref 3.5–5.2)
SODIUM: 143 mmol/L (ref 134–144)

## 2017-06-01 NOTE — Assessment & Plan Note (Signed)
He is interested in refilling his duloxetine for peripheral neuropathy. I believe his increased pain may be more related to his increased edema than neuropathy today.  Reordered duloxetine 60mg  daily

## 2017-06-01 NOTE — Assessment & Plan Note (Addendum)
A: His current symptoms are mostly likely due to mild heart failure decompensation. He is taking lasix 40mg  daily but has definite evidence of fluid retention based on peripheral edema and weight gain. He does not have some findings such as JVD or inspiratory crackles although exam is challenging due to morbid obesity. There are no findings suggesting a primary respiratory process on exam. Of note it appears he was on Jardiance but the latest documented Rx would be completed months ago. This might be a good option for helping diabetes and fluid retention. P: Recommend he double daily lasix to 40mg  BID and follow up next week for symptom improvement. Bmet today to exclude new renal insufficiency as cause

## 2017-06-02 ENCOUNTER — Telehealth: Payer: Self-pay | Admitting: *Deleted

## 2017-06-02 NOTE — Telephone Encounter (Signed)
Return call to [facility]-Message left on recorder to provide pt with 68mths (samples) and we will contact them again after MD sees pt 1/22. Pt was contacted and made aware.  No further actions needed at this time, phone call complete.Terry Hidden Cassady1/17/20194:37 PM

## 2017-06-02 NOTE — Telephone Encounter (Signed)
Please tell patient to get the 2 month sample. I will evaluate at next visit on 06/07/17. Thank you!  Terry Parks

## 2017-06-02 NOTE — Telephone Encounter (Signed)
Received fax from [place] with the following message:  "Azor is no longer available for free-we have 2 months worth of samples we can provide for patient-after that he will need to   1. purchase this at a retail pharmacy or 2. he can purchase amlodipine and losartan from our health dept pharmacy or 3. He can purchase amlodipine from our heath dept pharmacy and [facility] can obtain Edarbi for free.   Please advise with your decision-if using our [facility], please send new rxs"  Will send info to pcp for review.Regenia Skeeter, Eboney Claybrook Cassady1/17/20199:24 AM

## 2017-06-02 NOTE — Progress Notes (Signed)
Internal Medicine Clinic Attending  Case discussed with Dr. Rice at the time of the visit.  We reviewed the resident's history and exam and pertinent patient test results.  I agree with the assessment, diagnosis, and plan of care documented in the resident's note.  

## 2017-06-06 ENCOUNTER — Other Ambulatory Visit: Payer: Self-pay

## 2017-06-06 ENCOUNTER — Inpatient Hospital Stay (HOSPITAL_COMMUNITY)
Admission: EM | Admit: 2017-06-06 | Discharge: 2017-06-14 | DRG: 292 | Disposition: A | Discharge status: Home / Self Care | Payer: Self-pay | Attending: Student in an Organized Health Care Education/Training Program | Admitting: Student in an Organized Health Care Education/Training Program

## 2017-06-06 ENCOUNTER — Inpatient Hospital Stay (HOSPITAL_COMMUNITY): Payer: Self-pay

## 2017-06-06 ENCOUNTER — Emergency Department (HOSPITAL_COMMUNITY): Payer: Self-pay

## 2017-06-06 ENCOUNTER — Other Ambulatory Visit (HOSPITAL_COMMUNITY): Payer: Self-pay

## 2017-06-06 DIAGNOSIS — R778 Other specified abnormalities of plasma proteins: Secondary | ICD-10-CM

## 2017-06-06 DIAGNOSIS — E669 Obesity, unspecified: Secondary | ICD-10-CM

## 2017-06-06 DIAGNOSIS — I482 Chronic atrial fibrillation: Secondary | ICD-10-CM

## 2017-06-06 DIAGNOSIS — Z87442 Personal history of urinary calculi: Secondary | ICD-10-CM

## 2017-06-06 DIAGNOSIS — N189 Chronic kidney disease, unspecified: Secondary | ICD-10-CM

## 2017-06-06 DIAGNOSIS — E1122 Type 2 diabetes mellitus with diabetic chronic kidney disease: Secondary | ICD-10-CM

## 2017-06-06 DIAGNOSIS — E876 Hypokalemia: Secondary | ICD-10-CM | POA: Diagnosis present

## 2017-06-06 DIAGNOSIS — K5900 Constipation, unspecified: Secondary | ICD-10-CM | POA: Diagnosis present

## 2017-06-06 DIAGNOSIS — N138 Other obstructive and reflux uropathy: Secondary | ICD-10-CM

## 2017-06-06 DIAGNOSIS — I248 Other forms of acute ischemic heart disease: Secondary | ICD-10-CM | POA: Diagnosis present

## 2017-06-06 DIAGNOSIS — I471 Supraventricular tachycardia: Secondary | ICD-10-CM | POA: Diagnosis present

## 2017-06-06 DIAGNOSIS — I481 Persistent atrial fibrillation: Secondary | ICD-10-CM | POA: Diagnosis not present

## 2017-06-06 DIAGNOSIS — N401 Enlarged prostate with lower urinary tract symptoms: Secondary | ICD-10-CM

## 2017-06-06 DIAGNOSIS — Z888 Allergy status to other drugs, medicaments and biological substances status: Secondary | ICD-10-CM

## 2017-06-06 DIAGNOSIS — I130 Hypertensive heart and chronic kidney disease with heart failure and stage 1 through stage 4 chronic kidney disease, or unspecified chronic kidney disease: Secondary | ICD-10-CM

## 2017-06-06 DIAGNOSIS — G4733 Obstructive sleep apnea (adult) (pediatric): Secondary | ICD-10-CM

## 2017-06-06 DIAGNOSIS — I110 Hypertensive heart disease with heart failure: Principal | ICD-10-CM | POA: Diagnosis present

## 2017-06-06 DIAGNOSIS — Z794 Long term (current) use of insulin: Secondary | ICD-10-CM

## 2017-06-06 DIAGNOSIS — Z833 Family history of diabetes mellitus: Secondary | ICD-10-CM

## 2017-06-06 DIAGNOSIS — Z91128 Patient's intentional underdosing of medication regimen for other reason: Secondary | ICD-10-CM

## 2017-06-06 DIAGNOSIS — E11319 Type 2 diabetes mellitus with unspecified diabetic retinopathy without macular edema: Secondary | ICD-10-CM | POA: Diagnosis present

## 2017-06-06 DIAGNOSIS — Z8249 Family history of ischemic heart disease and other diseases of the circulatory system: Secondary | ICD-10-CM

## 2017-06-06 DIAGNOSIS — R7989 Other specified abnormal findings of blood chemistry: Secondary | ICD-10-CM

## 2017-06-06 DIAGNOSIS — I959 Hypotension, unspecified: Secondary | ICD-10-CM | POA: Diagnosis not present

## 2017-06-06 DIAGNOSIS — N179 Acute kidney failure, unspecified: Secondary | ICD-10-CM

## 2017-06-06 DIAGNOSIS — E872 Acidosis: Secondary | ICD-10-CM | POA: Diagnosis present

## 2017-06-06 DIAGNOSIS — I4581 Long QT syndrome: Secondary | ICD-10-CM | POA: Diagnosis present

## 2017-06-06 DIAGNOSIS — I4811 Longstanding persistent atrial fibrillation: Secondary | ICD-10-CM

## 2017-06-06 DIAGNOSIS — Z79899 Other long term (current) drug therapy: Secondary | ICD-10-CM

## 2017-06-06 DIAGNOSIS — E785 Hyperlipidemia, unspecified: Secondary | ICD-10-CM

## 2017-06-06 DIAGNOSIS — E1165 Type 2 diabetes mellitus with hyperglycemia: Secondary | ICD-10-CM

## 2017-06-06 DIAGNOSIS — I4819 Other persistent atrial fibrillation: Secondary | ICD-10-CM

## 2017-06-06 DIAGNOSIS — N1330 Unspecified hydronephrosis: Secondary | ICD-10-CM | POA: Diagnosis present

## 2017-06-06 DIAGNOSIS — I5043 Acute on chronic combined systolic (congestive) and diastolic (congestive) heart failure: Secondary | ICD-10-CM

## 2017-06-06 DIAGNOSIS — N329 Bladder disorder, unspecified: Secondary | ICD-10-CM | POA: Diagnosis present

## 2017-06-06 DIAGNOSIS — R0602 Shortness of breath: Secondary | ICD-10-CM

## 2017-06-06 DIAGNOSIS — E1140 Type 2 diabetes mellitus with diabetic neuropathy, unspecified: Secondary | ICD-10-CM | POA: Diagnosis present

## 2017-06-06 DIAGNOSIS — I428 Other cardiomyopathies: Secondary | ICD-10-CM | POA: Diagnosis present

## 2017-06-06 DIAGNOSIS — I4892 Unspecified atrial flutter: Secondary | ICD-10-CM | POA: Diagnosis not present

## 2017-06-06 DIAGNOSIS — Z803 Family history of malignant neoplasm of breast: Secondary | ICD-10-CM

## 2017-06-06 DIAGNOSIS — R338 Other retention of urine: Secondary | ICD-10-CM | POA: Diagnosis present

## 2017-06-06 DIAGNOSIS — Z560 Unemployment, unspecified: Secondary | ICD-10-CM

## 2017-06-06 DIAGNOSIS — Z7982 Long term (current) use of aspirin: Secondary | ICD-10-CM

## 2017-06-06 DIAGNOSIS — R1084 Generalized abdominal pain: Secondary | ICD-10-CM

## 2017-06-06 LAB — COMPREHENSIVE METABOLIC PANEL
ALBUMIN: 3.1 g/dL — AB (ref 3.5–5.0)
ALK PHOS: 67 U/L (ref 38–126)
ALT: 16 U/L — ABNORMAL LOW (ref 17–63)
ANION GAP: 17 — AB (ref 5–15)
AST: 29 U/L (ref 15–41)
BILIRUBIN TOTAL: 1.8 mg/dL — AB (ref 0.3–1.2)
BUN: 42 mg/dL — AB (ref 6–20)
CO2: 21 mmol/L — AB (ref 22–32)
Calcium: 8.7 mg/dL — ABNORMAL LOW (ref 8.9–10.3)
Chloride: 94 mmol/L — ABNORMAL LOW (ref 101–111)
Creatinine, Ser: 6.24 mg/dL — ABNORMAL HIGH (ref 0.61–1.24)
GFR calc Af Amer: 10 mL/min — ABNORMAL LOW (ref >60)
GFR calc non Af Amer: 9 mL/min — ABNORMAL LOW (ref >60)
Glucose, Bld: 227 mg/dL — ABNORMAL HIGH (ref 65–99)
POTASSIUM: 4.9 mmol/L (ref 3.5–5.1)
SODIUM: 132 mmol/L — AB (ref 135–145)
TOTAL PROTEIN: 6.5 g/dL (ref 6.5–8.1)

## 2017-06-06 LAB — GLUCOSE, CAPILLARY: Glucose-Capillary: 185 mg/dL — ABNORMAL HIGH (ref 65–99)

## 2017-06-06 LAB — CBC
HEMATOCRIT: 44.5 % (ref 39.0–52.0)
HEMOGLOBIN: 15.2 g/dL (ref 13.0–17.0)
MCH: 27.4 pg (ref 26.0–34.0)
MCHC: 34.2 g/dL (ref 30.0–36.0)
MCV: 80.2 fL (ref 78.0–100.0)
Platelets: 120 10*3/uL — ABNORMAL LOW (ref 150–400)
RBC: 5.55 MIL/uL (ref 4.22–5.81)
RDW: 14.8 % (ref 11.5–15.5)
WBC: 23.3 10*3/uL — AB (ref 4.0–10.5)

## 2017-06-06 LAB — I-STAT TROPONIN, ED: Troponin i, poc: 0.13 ng/mL (ref 0.00–0.08)

## 2017-06-06 LAB — TROPONIN I: TROPONIN I: 0.24 ng/mL — AB (ref <0.03)

## 2017-06-06 LAB — HEMOGLOBIN A1C
Hgb A1c MFr Bld: 8.1 % — ABNORMAL HIGH (ref 4.8–5.6)
Mean Plasma Glucose: 185.77 mg/dL

## 2017-06-06 LAB — LACTIC ACID, PLASMA
LACTIC ACID, VENOUS: 2.9 mmol/L — AB (ref 0.5–1.9)
LACTIC ACID, VENOUS: 1.8 mmol/L (ref 0.5–1.9)

## 2017-06-06 LAB — HEPARIN LEVEL (UNFRACTIONATED): Heparin Unfractionated: 0.18 IU/mL — ABNORMAL LOW (ref 0.30–0.70)

## 2017-06-06 LAB — TSH: TSH: 0.236 u[IU]/mL — ABNORMAL LOW (ref 0.350–4.500)

## 2017-06-06 LAB — LIPASE, BLOOD: Lipase: 19 U/L (ref 11–51)

## 2017-06-06 LAB — BRAIN NATRIURETIC PEPTIDE: B Natriuretic Peptide: 991.6 pg/mL — ABNORMAL HIGH (ref 0.0–100.0)

## 2017-06-06 MED ORDER — DULOXETINE HCL 60 MG PO CPEP
60.0000 mg | ORAL_CAPSULE | Freq: Every day | ORAL | Status: DC
  Administered 2017-06-06 – 2017-06-14 (×7): 60 mg via ORAL
  Filled 2017-06-06 (×5): qty 1
  Filled 2017-06-06: qty 2
  Filled 2017-06-06 (×2): qty 1

## 2017-06-06 MED ORDER — MAGNESIUM SULFATE 2 GM/50ML IV SOLN: 2.0000 g | Freq: Once | INTRAVENOUS | Status: DC

## 2017-06-06 MED ORDER — POLYETHYLENE GLYCOL 3350 17 G PO PACK
17.0000 g | PACK | Freq: Once | ORAL | Status: AC
  Administered 2017-06-08: 17 g via ORAL

## 2017-06-06 MED ORDER — CARVEDILOL 6.25 MG PO TABS
6.2500 mg | ORAL_TABLET | Freq: Two times a day (BID) | ORAL | Status: DC
  Administered 2017-06-06: 6.25 mg via ORAL
  Filled 2017-06-06: qty 1

## 2017-06-06 MED ORDER — ONDANSETRON 4 MG PO TBDP
4.0000 mg | ORAL_TABLET | Freq: Three times a day (TID) | ORAL | Status: DC | PRN
  Filled 2017-06-06: qty 1

## 2017-06-06 MED ORDER — CARVEDILOL 12.5 MG PO TABS
12.5000 mg | ORAL_TABLET | Freq: Once | ORAL | Status: AC
  Administered 2017-06-06: 12.5 mg via ORAL
  Filled 2017-06-06: qty 1

## 2017-06-06 MED ORDER — HEPARIN SODIUM (PORCINE) 5000 UNIT/ML IJ SOLN
5000.0000 [IU] | Freq: Three times a day (TID) | INTRAMUSCULAR | Status: DC
  Filled 2017-06-06: qty 1

## 2017-06-06 MED ORDER — HEPARIN BOLUS VIA INFUSION
4000.0000 [IU] | Freq: Once | INTRAVENOUS | Status: AC
  Administered 2017-06-06: 4000 [IU] via INTRAVENOUS
  Filled 2017-06-06: qty 4000

## 2017-06-06 MED ORDER — HEPARIN (PORCINE) IN NACL 100-0.45 UNIT/ML-% IJ SOLN
2000.0000 [IU]/h | INTRAMUSCULAR | Status: DC
  Administered 2017-06-06: 1600 [IU]/h via INTRAVENOUS
  Filled 2017-06-06 (×2): qty 250

## 2017-06-06 MED ORDER — ONDANSETRON HCL 4 MG/2ML IJ SOLN
4.0000 mg | Freq: Once | INTRAMUSCULAR | Status: AC
  Administered 2017-06-06: 4 mg via INTRAVENOUS
  Filled 2017-06-06: qty 2

## 2017-06-06 MED ORDER — ACETAMINOPHEN 325 MG PO TABS
650.0000 mg | ORAL_TABLET | Freq: Four times a day (QID) | ORAL | Status: DC | PRN
  Administered 2017-06-09 – 2017-06-12 (×3): 650 mg via ORAL
  Filled 2017-06-06 (×4): qty 2

## 2017-06-06 MED ORDER — CARVEDILOL 25 MG PO TABS
25.0000 mg | ORAL_TABLET | Freq: Two times a day (BID) | ORAL | Status: DC
  Administered 2017-06-07: 25 mg via ORAL
  Filled 2017-06-06: qty 1

## 2017-06-06 MED ORDER — CARVEDILOL 12.5 MG PO TABS: 25.0000 mg | ORAL_TABLET | Freq: Two times a day (BID) | ORAL | Status: DC

## 2017-06-06 MED ORDER — ACETAMINOPHEN 650 MG RE SUPP: 650.0000 mg | Freq: Four times a day (QID) | RECTAL | Status: DC | PRN

## 2017-06-06 MED ORDER — SENNOSIDES-DOCUSATE SODIUM 8.6-50 MG PO TABS: 1.0000 | ORAL_TABLET | Freq: Every evening | ORAL | Status: DC | PRN

## 2017-06-06 MED ORDER — SODIUM CHLORIDE 0.9 % IV BOLUS (SEPSIS): 1000.0000 mL | Freq: Once | INTRAVENOUS | Status: DC

## 2017-06-06 MED ORDER — HEPARIN BOLUS VIA INFUSION
3000.0000 [IU] | Freq: Once | INTRAVENOUS | Status: AC
  Administered 2017-06-06: 3000 [IU] via INTRAVENOUS
  Filled 2017-06-06: qty 3000

## 2017-06-06 MED ORDER — INSULIN GLARGINE 100 UNIT/ML ~~LOC~~ SOLN
40.0000 [IU] | Freq: Every day | SUBCUTANEOUS | Status: DC
  Administered 2017-06-07 – 2017-06-13 (×7): 40 [IU] via SUBCUTANEOUS
  Filled 2017-06-06 (×9): qty 0.4

## 2017-06-06 MED ORDER — MORPHINE SULFATE (PF) 4 MG/ML IV SOLN
4.0000 mg | Freq: Once | INTRAVENOUS | Status: AC
  Administered 2017-06-06: 4 mg via INTRAVENOUS
  Filled 2017-06-06: qty 1

## 2017-06-06 MED ORDER — ISOSORBIDE MONONITRATE ER 30 MG PO TB24
30.0000 mg | ORAL_TABLET | Freq: Every day | ORAL | Status: DC
  Administered 2017-06-06: 30 mg via ORAL
  Filled 2017-06-06 (×2): qty 1

## 2017-06-06 MED ORDER — FUROSEMIDE 10 MG/ML IJ SOLN
40.0000 mg | Freq: Two times a day (BID) | INTRAMUSCULAR | Status: DC
  Administered 2017-06-06 – 2017-06-07 (×2): 40 mg via INTRAVENOUS
  Filled 2017-06-06 (×2): qty 4

## 2017-06-06 MED ORDER — ASPIRIN EC 81 MG PO TBEC
81.0000 mg | DELAYED_RELEASE_TABLET | Freq: Every day | ORAL | Status: DC
  Administered 2017-06-06: 81 mg via ORAL
  Filled 2017-06-06 (×2): qty 1

## 2017-06-06 NOTE — ED Provider Notes (Signed)
[facility]ERGENCY DEPARTMENT Provider Note   CSN: [id number] Arrival date & time: 06/06/17  7829     History   Chief Complaint Chief Complaint  Patient presents with  . Shortness of Breath  . Abdominal Pain    HPI Terry Parks is a 62 y.o. male.  The history is provided by the patient.  Shortness of Breath  This is a recurrent problem. The problem occurs frequently.The problem has not changed since onset.Associated symptoms include cough, vomiting and abdominal pain. Pertinent negatives include no fever, no headaches, no sore throat, no ear pain, no neck pain, no sputum production, no hemoptysis, no wheezing, no chest pain, no syncope and no rash. Associated medical issues include heart failure.  Abdominal Pain   This is a new problem. The current episode started more than 2 days ago. The problem occurs constantly. The problem has not changed since onset.The pain is associated with an unknown factor. The pain is located in the generalized abdominal region. The pain is at a severity of 5/10. Associated symptoms include belching, nausea and vomiting. Pertinent negatives include fever, dysuria, hematuria, headaches and arthralgias.    62 year old male with history of diabetes hypertension and cardiomyopathy here with increased shortness of breath.  Patient states for over a year he gets on and off shortness of breath that affects him with his ambulation.  He states he was in his doctor's office last week and was supposed to come back today for the results of his tests but when he got to the office his appointment was supposed to be for tomorrow.  He states that he he told him he needed to go to the emergency department because of his breathing.  He is also complaining of no bowel movement for 4 days and and inability to eat for that long also.  He states he is having difficulty swallowing his food and anything he does swallow he vomits back up again.  He is  complaining of diffuse abdominal pain of moderate severity.  He is denying chest pain.  He is not sure if he has had a fever.  He denies any urine symptoms.  He denies smoking alcohol or any street drugs.  Past Medical History:  Diagnosis Date  . Abscess of groin   . Abscessed tooth   . Allergic rhinitis   . BPH (benign prostatic hypertrophy)    Massive BPH noted on cystoscopy 1/23/ 2012 by Dr. Risa Grill.  . Cardiomegaly    Mild by CXR 11/04/04  . Diabetes mellitus 04/08/2008  . Headache(784.0)   . Hyperlipemia   . Hypertension   . Hypertensive cardiopathy 03/01/2006   2-D echocardiogram 02/01/2012 showed moderate LVH, mildly to moderately reduced left ventricular systolic function with an estimated ejection fraction of 40-45%, and diffuse hypokinesis.  A nuclear medicine stress study done 01/31/2012 showed no reversible ischemia, a small mid anterior wall fixed defect/infarct, and ejection fraction 42%.      . Neck pain   . Obstructive sleep apnea 03/06/2008   Sleep study 03/06/08 showed severe OSA/hypopnea syndrome, with successful CPAP titration to 13 CWP using a medium ResMed Mirage Quattro full face mask with heated humidifier.   . Renal calculus 05/29/2010   CT scan of abdomen/pelvis on 05/29/2010 showed an obstructing approximate 1-2 mm calculus at the left UVJ, and an approximate 1-2 mm left lower pole renal calculus.   Patient had continuing severe pain , and an elevation of his serum creatinine to a value of 1.75  on 06/06/2010.  The stone had apparently passed and was not seen on repeat CT 06/08/2010.    Patient Active Problem List   Diagnosis Date Noted  . Urinary straining 11/02/2016  . Encounter for HCV screening test for low risk patient 11/02/2016  . Chest pain 11/02/2016  . Vitamin D deficiency 06/22/2016  . Neuropathy in diabetes ([place]) 10/07/2015  . Erectile dysfunction 04/17/2014  . Rash 04/17/2014  . Allergic rhinitis 10/19/2012  . Diabetic retinopathy ([place]) 10/04/2012  .  Cough 03/30/2012  . BPH   . Nephrolithiasis 05/29/2010  . Type 2 diabetes mellitus with peripheral neuropathy ([place]) 04/08/2008  . Obstructive sleep apnea   . Morbid obesity ([place])   . Hyperlipidemia associated with type 2 diabetes mellitus ([place])   . Essential hypertension   . Chronic combined systolic and diastolic CHF (congestive heart failure) ([place]) 03/01/2006    Past Surgical History:  Procedure Laterality Date  . CYSTOSCOPY W/ RETROGRADES         Home Medications    Prior to Admission medications   Medication Sig Start Date End Date Taking? Authorizing Provider  amLODipine-olmesartan (AZOR) 5-40 MG tablet Take 1 tablet by mouth daily. 05/30/17   Rice, Resa Miner, MD  Armodafinil (NUVIGIL) 150 MG tablet Take 1 tablet (150 mg total) by mouth daily. As needed. Patient taking differently: Take 150 mg by mouth daily.  06/12/15   Troy Sine, MD  aspirin 81 MG EC tablet Take 81 mg by mouth daily.     [provider]  atorvastatin (LIPITOR) 40 MG tablet Take 1 tablet (40 mg total) by mouth daily. 12/29/16   Aldine Contes, MD  carvedilol (COREG) 25 MG tablet Take 1 tablet (25 mg total) by mouth 2 (two) times daily. 05/20/17 06/19/17  [name], MD  Cholecalciferol 2000 units TABS Take 1 tablet (2,000 Units total) by mouth daily. 11/06/16   Riccardo Dubin, MD  Cyanocobalamin (VITAMIN B 12 PO) Take 1 tablet by mouth daily.    [provider]  DULoxetine (CYMBALTA) 60 MG capsule Take 1 capsule (60 mg total) by mouth daily. 05/30/17 05/30/18  Collier Salina, MD  empagliflozin (JARDIANCE) 10 MG TABS tablet Take 10 mg by mouth daily. 10/07/15   Riccardo Dubin, MD  furosemide (LASIX) 40 MG tablet Take 1 tablet (40 mg total) by mouth daily. 10/21/16 10/21/17  Troy Sine, MD  glucose blood (TRUETRACK TEST) test strip Use as directed to test blood sugar three times a day before meals. 10/04/12   Bertha Stakes, MD  Insulin Glargine (LANTUS SOLOSTAR) 100 UNIT/ML  Solostar Pen Inject 56 Units into the skin daily. 05/20/17   [name], [name], MD  Insulin Pen Needle (CARETOUCH PEN NEEDLES) 31G X 6 MM MISC 1 pen by Does not apply route at bedtime. 03/01/17   [name], [name], MD  Ipratropium-Albuterol (COMBIVENT) 20-100 MCG/ACT AERS respimat Inhale 1 puff into the lungs every 6 (six) hours as needed for wheezing or shortness of breath. 01/18/14   Oval Linsey, MD  isosorbide mononitrate (IMDUR) 30 MG 24 hr tablet Take 1 tablet (30 mg total) by mouth daily. 11/02/16   Riccardo Dubin, MD  Lancets MISC Use to test blood sugar three times a day before meals. 09/14/13   Malena Catholic, MD  metFORMIN (GLUCOPHAGE) 1000 MG tablet Take 1 tablet (1,000 mg total) by mouth 2 (two) times daily with a meal. 07/20/16   Dellia Nims, MD    Family History Family History  Problem  Relation Age of Onset  . Breast cancer Mother   . Hypertension Father   . Diabetes Maternal Grandmother   . Colon cancer Neg Hx   . Prostate cancer Neg Hx   . Heart attack Neg Hx     Social History Social History   Tobacco Use  . Smoking status: Never Smoker  . Smokeless tobacco: Never Used  Substance Use Topics  . Alcohol use: No    Alcohol/week: 0.0 oz  . Drug use: No     Allergies   Lisinopril   Review of Systems Review of Systems  Constitutional: Negative for chills and fever.  HENT: Negative for ear pain and sore throat.   Eyes: Negative for pain and visual disturbance.  Respiratory: Positive for cough and shortness of breath. Negative for hemoptysis, sputum production and wheezing.   Cardiovascular: Negative for chest pain, palpitations and syncope.  Gastrointestinal: Positive for abdominal pain, nausea and vomiting.  Genitourinary: Negative for dysuria and hematuria.  Musculoskeletal: Negative for arthralgias, back pain and neck pain.  Skin: Negative for color change and rash.  Neurological: Negative for seizures, syncope and headaches.  All other systems reviewed  and are negative.    Physical Exam Updated Vital Signs BP (!) 179/110 (BP Location: Left Arm)   Pulse (!) 115   Temp 97.6 F (36.4 C) (Oral)   Resp (!) 23   SpO2 94%   Physical Exam  Constitutional: He appears well-developed and well-nourished.  HENT:  Head: Normocephalic and atraumatic.  Mouth/Throat: Oropharynx is clear and moist.  Eyes: Conjunctivae are normal. Pupils are equal, round, and reactive to light.  Neck: Neck supple.  Cardiovascular: Normal rate and regular rhythm.  No murmur heard. Pulmonary/Chest: Effort normal and breath sounds normal. No respiratory distress. He exhibits no tenderness.  Abdominal: Soft. He exhibits distension. There is no tenderness.  Musculoskeletal: Normal range of motion.       Right lower leg: He exhibits edema. He exhibits no tenderness.       Left lower leg: He exhibits edema. He exhibits no tenderness.  Neurological: He is alert.  Skin: Skin is warm and dry.  Psychiatric: He has a normal mood and affect.  Nursing note and vitals reviewed.    ED Treatments / Results  Labs (all labs ordered are listed, but only abnormal results are displayed) Labs Reviewed  CBC  COMPREHENSIVE METABOLIC PANEL  LIPASE, BLOOD  BRAIN NATRIURETIC PEPTIDE  LACTIC ACID, PLASMA  LACTIC ACID, PLASMA  I-STAT TROPONIN, ED    EKG  Adult ECG Report   Name: Terry Parks  Age: 62 y.o.  Gender: male    Rate: atrial tachy ? Sinus with pacs   Rhythm: indeterminate    QRS Axis:   right  PR Interval:    QRS Duration:    QTc: prolonged    Voltages: elevated   Conduction Disturbances: none  Other Abnormalities: none   Narrative Interpretation: atrial tachy with nonspecific st/ts, prolonged qtc  Radiology Dg Chest 2 View  Result Date: 06/06/2017 CLINICAL DATA:  Chest pain for 4 days. EXAM: CHEST  2 VIEW COMPARISON:  July 12, 2014 FINDINGS: The mediastinal contour is normal. The heart size is enlarged. There is mild central pulmonary  vascular congestion. There is no focal pneumonia or pleural effusion. The visualized skeletal structures are unremarkable. IMPRESSION: Cardiomegaly. Mild central pulmonary vascular congestion. No frank pulmonary edema. Electronically Signed   By: Abelardo Diesel M.D.   On: 06/06/2017 10:11    Procedures Procedures (  including critical care time)  Medications Ordered in ED Medications  morphine 4 MG/ML injection 4 mg (not administered)  ondansetron (ZOFRAN) injection 4 mg (not administered)  sodium chloride 0.9 % bolus 1,000 mL (not administered)     Initial Impression / Assessment and Plan / ED Course  I have reviewed the triage vital signs and the nursing notes.  Pertinent labs & imaging results that were available during my care of the patient were reviewed by me and considered in my medical decision making (see chart for details).  Clinical Course as of Jun 07 1251  Mon Jun 06, 2017  1135 Differential diagnosis includes acute coronary syndrome, CHF, pneumonia, abdominal obstruction, PE.  His initial lab work is just starting to come back with an elevated white count and a slight rise in his troponin.  His EKG looks like he is got a new right axis and a sinus tachycardia.  His chest x-ray demonstrates cardiomegaly and some cephalization but no pneumonia identified.  Patient will need to be admitted to the hospital but likely need further imaging, although prior he has some renal insufficiency so I do not know if he is going to be able to tolerate contrast.  [MB]  1238 A review of the patient's workup with the internal medicine on-call attending who accepts the patient for admission to their service.  [MB]  7867 Discussed with renal on-call who will consult in the patient when they are admitted.  [MB]    Clinical Course User Index [MB] Hayden Rasmussen, MD    CRITICAL CARE Performed by: Hayden Rasmussen   Total critical care time: 30 minutes  Critical care time was exclusive of  separately billable procedures and treating other patients.  Critical care was necessary to treat or prevent imminent or life-threatening deterioration.  Critical care was time spent personally by me on the following activities: development of treatment plan with patient and/or surrogate as well as nursing, discussions with consultants, evaluation of patient's response to treatment, examination of patient, obtaining history from patient or surrogate, ordering and performing treatments and interventions, ordering and review of laboratory studies, ordering and review of radiographic studies, pulse oximetry and re-evaluation of patient's condition.  New onset renal failure with elevated lactate, troponin. Review of prior records, discussion with admitting team and nephrology consultant.   Final Clinical Impressions(s) / ED Diagnoses   Final diagnoses:  Shortness of breath  Generalized abdominal pain  Acute renal failure, unspecified acute renal failure type [facility])  Elevated troponin    ED Discharge Orders    None       Hayden Rasmussen, MD 06/06/17 [phone]

## 2017-06-06 NOTE — Progress Notes (Signed)
[place] for Heparin Indication: Atrial flutter  Allergies  Allergen Reactions  . Lisinopril Cough    Patient Measurements: Height: 6\' 1"  (185.4 cm) Weight: 288 lb (130.6 kg) IBW/kg (Calculated) : 79.9 Heparin Dosing Weight: 114kg  Vital Signs: Temp: 97.6 F (36.4 C) (01/21 2113) Temp Source: Oral (01/21 2113) BP: 173/107 (01/21 2113) Pulse Rate: 121 (01/21 2113)  Labs: Recent Labs    06/06/17 1042 06/06/17 2208 06/06/17 2219  HGB 15.2  --   --   HCT 44.5  --   --   PLT 120*  --   --   HEPARINUNFRC  --   --  0.18*  CREATININE 6.24*  --   --   TROPONINI  --  0.24*  --     Estimated Creatinine Clearance: 18.1 mL/min (A) (by C-G formula based on SCr of 6.24 mg/dL (H)).   Medical History: Past Medical History:  Diagnosis Date  . Abscess of groin   . Abscessed tooth   . Allergic rhinitis   . BPH (benign prostatic hypertrophy)    Massive BPH noted on cystoscopy 1/23/ 2012 by Dr. Risa Grill.  . Cardiomegaly    Mild by CXR 11/04/04  . Diabetes mellitus 04/08/2008  . Headache(784.0)   . Hyperlipemia   . Hypertension   . Hypertensive cardiopathy 03/01/2006   2-D echocardiogram 02/01/2012 showed moderate LVH, mildly to moderately reduced left ventricular systolic function with an estimated ejection fraction of 40-45%, and diffuse hypokinesis.  A nuclear medicine stress study done 01/31/2012 showed no reversible ischemia, a small mid anterior wall fixed defect/infarct, and ejection fraction 42%.      . Neck pain   . Obstructive sleep apnea 03/06/2008   Sleep study 03/06/08 showed severe OSA/hypopnea syndrome, with successful CPAP titration to 13 CWP using a medium ResMed Mirage Quattro full face mask with heated humidifier.   . Renal calculus 05/29/2010   CT scan of abdomen/pelvis on 05/29/2010 showed an obstructing approximate 1-2 mm calculus at the left UVJ, and an approximate 1-2 mm left lower pole renal calculus.   Patient had  continuing severe pain , and an elevation of his serum creatinine to a value of 1.75 on 06/06/2010.  The stone had apparently passed and was not seen on repeat CT 06/08/2010.   Assessment: 61yom to begin heparin for new aflutter. AKI noted. CBC wnl. No anticoagulants pta. Initial heparin level tonight is sub-therapeutic at 0.18.   Goal of Therapy:  Heparin level 0.3-0.7 units/ml Monitor platelets by anticoagulation protocol: Yes   Plan:  Heparin 3000 units [employer] heparin drip to 1800 units/hr 0800 HL  [name], PharmD, BCPS Clinical Pharmacist Phone: [phone]

## 2017-06-06 NOTE — Consult Note (Signed)
[facility] Renal Consultation Note  Requesting MD: Evette Doffing- Internal Medicine Teaching Indication for Consultation: AK I  HPI:  Terry Parks is a 62 y.o. male with past medical history significant for diabetes mellitus, hypertension, obesity, hyperlipidemia, cardiomyopathy ejection fraction in the 36s, history of nephrolithiasis as well as what was noted as massive prostate enlargement by urology in 2012.  Patient states that he stopped taking all of his medications about a month ago.  He is not sure why he did that.  He presented 1 week ago on 1/14 to his PCP with symptoms of volume overload.  At that visit he was started on Lasix 40 mg twice daily p.o.  He states that this helped his swelling.  He feels like he is passing urine okay but then reports of straining.  He has not started taking any of his other medications as he has an ARB on his medication list.  He denies using NSAIDs.  On 1/14 creatinine was noted to be 1.34 and today was 6.24.  He is uncomfortable but really unable to put it into words.  Other labs of note potassium 4.9, lactic acid of 2.9, hemoglobin 15, troponin 0 0.13 and BNP of 991.  Chest x-ray shows mild vascular congestion  Creat  Date/Time Value Ref Range Status  07/18/2014 10:56 AM 1.08 0.50 - 1.35 mg/dL Final  04/17/2014 11:11 AM 1.04 0.50 - 1.35 mg/dL Final  01/24/2013 11:59 AM 1.10 0.50 - 1.35 mg/dL Final  10/19/2012 10:07 AM 1.14 0.50 - 1.35 mg/dL Final  10/04/2012 10:31 AM 1.33 0.50 - 1.35 mg/dL Final  07/15/2010 09:55 AM 1.09 0.40 - 1.50 mg/dL Final   Creatinine, Ser  Date/Time Value Ref Range Status  06/06/2017 10:42 AM 6.24 (H) 0.61 - 1.24 mg/dL Final  05/30/2017 02:38 PM 1.34 (H) 0.76 - 1.27 mg/dL Final  11/21/2016 04:21 PM 1.10 0.61 - 1.24 mg/dL Final  11/02/2016 09:34 AM 1.13 0.76 - 1.27 mg/dL Final  10/07/2015 10:42 AM 1.11 0.76 - 1.27 mg/dL Final  04/29/2015 11:06 AM 1.15 0.61 - 1.24 mg/dL Final  02/18/2015 04:48 PM 1.07 0.76 -  1.27 mg/dL Final  01/15/2013 12:54 PM 1.10 0.50 - 1.35 mg/dL Final  02/01/2012 05:11 AM 0.90 0.50 - 1.35 mg/dL Final  01/31/2012 08:49 AM 0.84 0.50 - 1.35 mg/dL Final  07/15/2010 06:46 PM 1.09 0.40 - 1.50 mg/dL Final  06/09/2010 08:30 AM 1.17 0.4 - 1.5 mg/dL Final  06/08/2010 03:52 AM 1.38 0.4 - 1.5 mg/dL Final  06/07/2010 05:10 AM 1.49 0.4 - 1.5 mg/dL Final  06/06/2010 06:25 AM 1.75 (H) 0.4 - 1.5 mg/dL Final  06/05/2010 12:03 PM 1.68 (H) 0.4 - 1.5 mg/dL Final  04/15/2010 09:13 PM 1.19 (0.40-1.50 mg/dL Final  01/15/2010 06:09 PM 1.11 (0.40-1.50 mg/dL Final  07/31/2009 06:30 PM 1.27 (0.40-1.50 mg/dL Final  02/27/2009 08:06 PM 1.15 (0.40-1.50 mg/dL Final  01/31/2008 10:51 PM 1.18 0.40 - 1.50 mg/dL Final  06/05/2007 07:56 PM 1.16 0.40 - 1.50 mg/dL Final  02/21/2007 11:17 AM 1.2  Final     PMHx:   Past Medical History:  Diagnosis Date  . Abscess of groin   . Abscessed tooth   . Allergic rhinitis   . BPH (benign prostatic hypertrophy)    Massive BPH noted on cystoscopy 1/23/ 2012 by Dr. Risa Grill.  . Cardiomegaly    Mild by CXR 11/04/04  . Diabetes mellitus 04/08/2008  . Headache(784.0)   . Hyperlipemia   . Hypertension   . Hypertensive cardiopathy 03/01/2006   2-D  echocardiogram 02/01/2012 showed moderate LVH, mildly to moderately reduced left ventricular systolic function with an estimated ejection fraction of 40-45%, and diffuse hypokinesis.  A nuclear medicine stress study done 01/31/2012 showed no reversible ischemia, a small mid anterior wall fixed defect/infarct, and ejection fraction 42%.      . Neck pain   . Obstructive sleep apnea 03/06/2008   Sleep study 03/06/08 showed severe OSA/hypopnea syndrome, with successful CPAP titration to 13 CWP using a medium ResMed Mirage Quattro full face mask with heated humidifier.   . Renal calculus 05/29/2010   CT scan of abdomen/pelvis on 05/29/2010 showed an obstructing approximate 1-2 mm calculus at the left UVJ, and an approximate 1-2 mm  left lower pole renal calculus.   Patient had continuing severe pain , and an elevation of his serum creatinine to a value of 1.75 on 06/06/2010.  The stone had apparently passed and was not seen on repeat CT 06/08/2010.    Past Surgical History:  Procedure Laterality Date  . CYSTOSCOPY W/ RETROGRADES      Family Hx:  Family History  Problem Relation Age of Onset  . Breast cancer Mother   . Hypertension Father   . Diabetes Maternal Grandmother   . Colon cancer Neg Hx   . Prostate cancer Neg Hx   . Heart attack Neg Hx     Social History:  reports that  has never smoked. he has never used smokeless tobacco. He reports that he does not drink alcohol or use drugs.  Allergies:  Allergies  Allergen Reactions  . Lisinopril Cough    Medications: Prior to Admission medications   Medication Sig Start Date End Date Taking? Authorizing Provider  amLODipine-olmesartan (AZOR) 5-40 MG tablet Take 1 tablet by mouth daily. 05/30/17  Yes Rice, Resa Miner, MD  Armodafinil (NUVIGIL) 150 MG tablet Take 1 tablet (150 mg total) by mouth daily. As needed. Patient taking differently: Take 150 mg by mouth daily.  06/12/15  Yes Troy Sine, MD  aspirin 81 MG EC tablet Take 81 mg by mouth daily.    Yes [provider]  atorvastatin (LIPITOR) 40 MG tablet Take 1 tablet (40 mg total) by mouth daily. 12/29/16  Yes Aldine Contes, MD  carvedilol (COREG) 25 MG tablet Take 1 tablet (25 mg total) by mouth 2 (two) times daily. 05/20/17 06/19/17 Yes [name], Vahini, MD  DULoxetine (CYMBALTA) 60 MG capsule Take 1 capsule (60 mg total) by mouth daily. 05/30/17 05/30/18 Yes Rice, Resa Miner, MD  empagliflozin (JARDIANCE) 10 MG TABS tablet Take 10 mg by mouth daily. 10/07/15  Yes Riccardo Dubin, MD  furosemide (LASIX) 40 MG tablet Take 1 tablet (40 mg total) by mouth daily. Patient taking differently: Take 40 mg by mouth 2 (two) times daily.  10/21/16 10/21/17 Yes Troy Sine, MD  Insulin Glargine (LANTUS  SOLOSTAR) 100 UNIT/ML Solostar Pen Inject 56 Units into the skin daily. Patient taking differently: Inject 60 Units into the skin daily.  05/20/17  Yes [name], Vahini, MD  Ipratropium-Albuterol (COMBIVENT) 20-100 MCG/ACT AERS respimat Inhale 1 puff into the lungs every 6 (six) hours as needed for wheezing or shortness of breath. 01/18/14  Yes Oval Linsey, MD  isosorbide mononitrate (IMDUR) 30 MG 24 hr tablet Take 1 tablet (30 mg total) by mouth daily. 11/02/16  Yes Riccardo Dubin, MD  metFORMIN (GLUCOPHAGE) 1000 MG tablet Take 1 tablet (1,000 mg total) by mouth 2 (two) times daily with a meal. 07/20/16  Yes Dellia Nims, MD  Misc. Devices MISC by Does not apply route. C-PAP   Yes [provider]  Cholecalciferol 2000 units TABS Take 1 tablet (2,000 Units total) by mouth daily. Patient not taking: Reported on 06/06/2017 11/06/16   Riccardo Dubin, MD  glucose blood (TRUETRACK TEST) test strip Use as directed to test blood sugar three times a day before meals. 10/04/12   Bertha Stakes, MD  Insulin Pen Needle (CARETOUCH PEN NEEDLES) 31G X 6 MM MISC 1 pen by Does not apply route at bedtime. 03/01/17   [name], MD  Lancets MISC Use to test blood sugar three times a day before meals. 09/14/13   Malena Catholic, MD    I have reviewed the patient's current medications.  Labs:  Results for orders placed or performed during the hospital encounter of 06/06/17 (from the past 48 hour(s))  CBC     Status: Abnormal   Collection Time: 06/06/17 10:42 AM  Result Value Ref Range   WBC 23.3 (H) 4.0 - 10.5 K/uL   RBC 5.55 4.22 - 5.81 MIL/uL   Hemoglobin 15.2 13.0 - 17.0 g/dL   HCT 44.5 39.0 - 52.0 %   MCV 80.2 78.0 - 100.0 fL   MCH 27.4 26.0 - 34.0 pg   MCHC 34.2 30.0 - 36.0 g/dL   RDW 14.8 11.5 - 15.5 %   Platelets 120 (L) 150 - 400 K/uL  Comprehensive metabolic panel     Status: Abnormal   Collection Time: 06/06/17 10:42 AM  Result Value Ref Range   Sodium 132 (L) 135 - 145 mmol/L    Potassium 4.9 3.5 - 5.1 mmol/L    Comment: HEMOLYSIS AT THIS LEVEL MAY AFFECT RESULT   Chloride 94 (L) 101 - 111 mmol/L   CO2 21 (L) 22 - 32 mmol/L   Glucose, Bld 227 (H) 65 - 99 mg/dL   BUN 42 (H) 6 - 20 mg/dL   Creatinine, Ser 6.24 (H) 0.61 - 1.24 mg/dL   Calcium 8.7 (L) 8.9 - 10.3 mg/dL   Total Protein 6.5 6.5 - 8.1 g/dL   Albumin 3.1 (L) 3.5 - 5.0 g/dL   AST 29 15 - 41 U/L   ALT 16 (L) 17 - 63 U/L   Alkaline Phosphatase 67 38 - 126 U/L   Total Bilirubin 1.8 (H) 0.3 - 1.2 mg/dL   GFR calc non Af Amer 9 (L) >60 mL/min   GFR calc Af Amer 10 (L) >60 mL/min    Comment: (NOTE) The eGFR has been calculated using the CKD EPI equation. This calculation has not been validated in all clinical situations. eGFR's persistently <60 mL/min signify possible Chronic Kidney Disease.    Anion gap 17 (H) 5 - 15  Lipase, blood     Status: None   Collection Time: 06/06/17 10:42 AM  Result Value Ref Range   Lipase 19 11 - 51 U/L  Brain natriuretic peptide     Status: Abnormal   Collection Time: 06/06/17 10:42 AM  Result Value Ref Range   B Natriuretic Peptide 991.6 (H) 0.0 - 100.0 pg/mL  Lactic acid, plasma     Status: Abnormal   Collection Time: 06/06/17 10:42 AM  Result Value Ref Range   Lactic Acid, Venous 2.9 (HH) 0.5 - 1.9 mmol/L    Comment: CRITICAL RESULT CALLED TO, READ BACK BY AND VERIFIED WITH: M.PETERS,RN 06/06/17 1145 BY BSLADE   I-stat troponin, ED     Status: Abnormal   Collection Time: 06/06/17 10:53 AM  Result  Value Ref Range   Troponin i, poc 0.13 (HH) 0.00 - 0.08 ng/mL   Comment NOTIFIED PHYSICIAN    Comment 3            Comment: Due to the release kinetics of cTnI, a negative result within the first hours of the onset of symptoms does not rule out myocardial infarction with certainty. If myocardial infarction is still suspected, repeat the test at appropriate intervals.      ROS:  Review of systems not obtained due to patient factors.  Patient baseline his  reporting of symptoms.  He reports shortness of breath, lower extremity edema-and seems uncomfortable but cannot really tell me where.  He has a more protuberant abdomen than previously per him Physical Exam: Vitals:   06/06/17 1415 06/06/17 1430  BP: (!) 136/101 (!) 129/107  Pulse: (!) 57 (!) 118  Resp: 16 (!) 21  Temp:    SpO2: 94% 92%     General: Sleepy-just received morphine-uncomfortable but cannot verbalize why HEENT: Pupils are equal round reactive to light, extraocular motions are intact, mucous membranes are moist Neck: Positive for JVD Heart: Heart rate variable, possibly regular Lungs: Mostly clear Abdomen: Obese, distended, seemingly tender Extremities: Pitting edema but not extreme Skin: Warm and dry Neuro: Somnolent but arousable  Assessment/Plan: 62 year old black male with acute kidney injury onset the last 7 days in the setting of Lasix 40 mg twice daily and he says no other medications.  Also questionable history of urinary obstructive symptoms 1.Renal-creatinine increase from 1.3 to over 6 in 7 days.  Was being diuresed but sounds like it was appropriate and not excessive as he is still volume overloaded.  He denies any other nephrotoxins.  I am suspicious that he may be obstructed, will order urinalysis and renal ultrasound to evaluate.  He refuses Foley catheter right now but his ultrasound shows hydro-he will need one.  Since this is an acute onset I am hoping it will have reversibility.  Odds would be that obstruction versus ATN are much more likely than any kind of glomerulonephritis 2. Hypertension/volume  -he does not appear to be hypotensive.  He is volume overloaded but not to a significant degree.  He has not been taking his combination blood pressure medication with an ARB-would not challenge with an ACE or ARB at this time.  Is okay to diurese him-I believe until the teaching service Lasix 40 mg IV every 12 3. Anemia  -not an issue at this time 4.  Diabetes-it  is pretty impressive that his sugar was only 227 if he really has been without insulin or any other medication for a month  Thank you for this consult.  I will continue to follow with you   Khyree Carillo A 06/06/2017, 2:41 PM

## 2017-06-06 NOTE — Progress Notes (Signed)
ANTICOAGULATION CONSULT NOTE - Initial Consult  Pharmacy Consult for Heparin Indication: aflutter  Allergies  Allergen Reactions  . Lisinopril Cough    Patient Measurements: Height: 6\' 1"  (185.4 cm) Weight: (!) 325 lb 9.9 oz (147.7 kg) IBW/kg (Calculated) : 79.9 Heparin Dosing Weight: 114kg  Vital Signs: Temp: 97.4 F (36.3 C) (01/21 1357) Temp Source: Oral (01/21 1357) BP: 137/89 (01/21 1600) Pulse Rate: 62 (01/21 1600)  Labs: Recent Labs    06/06/17 1042  HGB 15.2  HCT 44.5  PLT 120*  CREATININE 6.24*    Estimated Creatinine Clearance: 19.3 mL/min (A) (by C-G formula based on SCr of 6.24 mg/dL (H)).   Medical History: Past Medical History:  Diagnosis Date  . Abscess of groin   . Abscessed tooth   . Allergic rhinitis   . BPH (benign prostatic hypertrophy)    Massive BPH noted on cystoscopy 1/23/ 2012 by Dr. Risa Grill.  . Cardiomegaly    Mild by CXR 11/04/04  . Diabetes mellitus 04/08/2008  . Headache(784.0)   . Hyperlipemia   . Hypertension   . Hypertensive cardiopathy 03/01/2006   2-D echocardiogram 02/01/2012 showed moderate LVH, mildly to moderately reduced left ventricular systolic function with an estimated ejection fraction of 40-45%, and diffuse hypokinesis.  A nuclear medicine stress study done 01/31/2012 showed no reversible ischemia, a small mid anterior wall fixed defect/infarct, and ejection fraction 42%.      . Neck pain   . Obstructive sleep apnea 03/06/2008   Sleep study 03/06/08 showed severe OSA/hypopnea syndrome, with successful CPAP titration to 13 CWP using a medium ResMed Mirage Quattro full face mask with heated humidifier.   . Renal calculus 05/29/2010   CT scan of abdomen/pelvis on 05/29/2010 showed an obstructing approximate 1-2 mm calculus at the left UVJ, and an approximate 1-2 mm left lower pole renal calculus.   Patient had continuing severe pain , and an elevation of his serum creatinine to a value of 1.75 on 06/06/2010.  The stone had  apparently passed and was not seen on repeat CT 06/08/2010.   Assessment: 61yom to begin heparin for new aflutter. AKI noted. CBC wnl. No anticoagulants pta.   Goal of Therapy:  Heparin level 0.3-0.7 units/ml Monitor platelets by anticoagulation protocol: Yes   Plan:  1) Heparin bolus 4000 units x 1 2) Heparin drip at 1600 units/hr 3) 8 hour heparin level 4) Daily heparin level, CBC  Deboraha Sprang 06/06/2017,5:26 PM

## 2017-06-06 NOTE — Progress Notes (Signed)
Md on call notified for tro of .24. Pt denies any CP at this time. MD also notifed of elevated BP and heart rate all throughout the day. MD to order 12.5 Coreg. Will follow orders and continue to monitor.

## 2017-06-06 NOTE — H&P (Signed)
Date: 06/06/2017               Patient Name:  Terry Parks MRN: [id number]  DOB: November 27, 1955 Age / Sex: 62 y.o., male   PCP: [name], MD         Medical Service: Internal Medicine Teaching Service         Attending Physician: Dr. Evette Doffing, Mallie Mussel, *    First Contact: Dr. [name] Pager: [phone]  Second Contact: Dr. Einar Gip Pager: [phone]       After Hours (After 5p/  First Contact Pager: [phone]  weekends / holidays): Second Contact Pager: [phone]   Chief Complaint: Abdominal Pain and Shortness of breath  History of Present Illness: Terry Parks is a 62 y.o m with combined systolic and diastolic heart failure, osa, diabetes mellitus type 2, hyperlipidemia, and bph  Per urology in 2012 who presents with a one week history of shortness of breath and abdominal pain. The patient was seen at [facility] on 05/30/17 for two week history of shortness of breath and 20lb weight gain. During that visit he was found to have peripheral edema but no inspiratory crackles and jvd could not be assessed properly due to the patient's obesity. During the visit it was noted that the patient has been out of duloxetine and jardiance and not taking amlodipine The patient was told to double his daily lasix dosage to 40mg  bid and follow up in clinic 06/07/17, duloxetine was given to control neuropathic pain, and bmet was ordered to exclude any new renal insufficiency as cause.   The patient notes that for the past few weeks he has noticed peripheral edema, shortness of breath, abdominal distention, nausea, and dyspnea on exertion with ambulation. The patient states that he has been having intermittent chest pain that is pleuritic in nature and feels is due to his increased volume status. He states that his dry weight is around 290lbs. The patient states that he has been missing doses of several of his medications.  He states that he has not had a bowel movement since 05/31/17. He denies any  fever, burning with urination or dysuria.  However, the patient states that he has to strain for urination due to his long-standing BPH.  ED Course: Chest x-ray, x-ray 1L bolus IVF, morphine, zofran  Meds:  Current Meds  Medication Sig  . amLODipine-olmesartan (AZOR) 5-40 MG tablet Take 1 tablet by mouth daily.  . Armodafinil (NUVIGIL) 150 MG tablet Take 1 tablet (150 mg total) by mouth daily. As needed. (Patient taking differently: Take 150 mg by mouth daily. )  . aspirin 81 MG EC tablet Take 81 mg by mouth daily.   [name] atorvastatin (LIPITOR) 40 MG tablet Take 1 tablet (40 mg total) by mouth daily.  . carvedilol (COREG) 25 MG tablet Take 1 tablet (25 mg total) by mouth 2 (two) times daily.  . DULoxetine (CYMBALTA) 60 MG capsule Take 1 capsule (60 mg total) by mouth daily.  . empagliflozin (JARDIANCE) 10 MG TABS tablet Take 10 mg by mouth daily.  . furosemide (LASIX) 40 MG tablet Take 1 tablet (40 mg total) by mouth daily. (Patient taking differently: Take 40 mg by mouth 2 (two) times daily. )  . Insulin Glargine (LANTUS SOLOSTAR) 100 UNIT/ML Solostar Pen Inject 56 Units into the skin daily. (Patient taking differently: Inject 60 Units into the skin daily. )  . Ipratropium-Albuterol (COMBIVENT) 20-100 MCG/ACT AERS respimat Inhale 1 puff into the lungs every 6 (six)  hours as needed for wheezing or shortness of breath.  . isosorbide mononitrate (IMDUR) 30 MG 24 hr tablet Take 1 tablet (30 mg total) by mouth daily.  . metFORMIN (GLUCOPHAGE) 1000 MG tablet Take 1 tablet (1,000 mg total) by mouth 2 (two) times daily with a meal.  . Misc. Devices MISC by Does not apply route. C-PAP     Allergies: Allergies as of 06/06/2017 - Review Complete 06/06/2017  Allergen Reaction Noted  . Lisinopril Cough 03/30/2012   Past Medical History:  Diagnosis Date  . Abscess of groin   . Abscessed tooth   . Allergic rhinitis   . BPH (benign prostatic hypertrophy)    Massive BPH noted on cystoscopy 1/23/  2012 by Dr. Risa Grill.  . Cardiomegaly    Mild by CXR 11/04/04  . Diabetes mellitus 04/08/2008  . Headache(784.0)   . Hyperlipemia   . Hypertension   . Hypertensive cardiopathy 03/01/2006   2-D echocardiogram 02/01/2012 showed moderate LVH, mildly to moderately reduced left ventricular systolic function with an estimated ejection fraction of 40-45%, and diffuse hypokinesis.  A nuclear medicine stress study done 01/31/2012 showed no reversible ischemia, a small mid anterior wall fixed defect/infarct, and ejection fraction 42%.      . Neck pain   . Obstructive sleep apnea 03/06/2008   Sleep study 03/06/08 showed severe OSA/hypopnea syndrome, with successful CPAP titration to 13 CWP using a medium ResMed Mirage Quattro full face mask with heated humidifier.   . Renal calculus 05/29/2010   CT scan of abdomen/pelvis on 05/29/2010 showed an obstructing approximate 1-2 mm calculus at the left UVJ, and an approximate 1-2 mm left lower pole renal calculus.   Patient had continuing severe pain , and an elevation of his serum creatinine to a value of 1.75 on 06/06/2010.  The stone had apparently passed and was not seen on repeat CT 06/08/2010.    Family History:  Family History  Problem Relation Age of Onset  . Breast cancer Mother   . Hypertension Father   . Diabetes Maternal Grandmother   . Colon cancer Neg Hx   . Prostate cancer Neg Hx   . Heart attack Neg Hx    Social History:  Social History   Socioeconomic History  . Marital status: Single    Spouse name: Not on file  . Number of children: 4 b  . Years of education: 83  . Highest education level: Not on file  Social Needs  . Financial resource strain: Not on file  . Food insecurity - worry: Not on file  . Food insecurity - inability: Not on file  . Transportation needs - medical: Not on file  . Transportation needs - non-medical: Not on file  Occupational History  . Occupation:        Employer: UNEMPLOYED  Tobacco Use  . Smoking status:  Never Smoker  . Smokeless tobacco: Never Used  Substance and Sexual Activity  . Alcohol use: No    Alcohol/week: 0.0 oz  . Drug use: No  . Sexual activity: Not Currently  Other Topics Concern  . Not on file  Social History Narrative   Divorced, 4 children, lives alone.  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    Review of Systems: A complete ROS was negative except as per HPI.  Physical Exam: Blood pressure 137/89, pulse 62, temperature (!) 97.4 F (36.3 C), temperature source Oral, resp. rate 19, height 6\' 1"  (1.854 m), weight (!) 325 lb 9.9 oz (147.7 kg), SpO2 (!) 87 %.  Physical Exam  Constitutional: He appears well-developed and well-nourished. He appears distressed.  HENT:  Head: Normocephalic and atraumatic.  Eyes: Conjunctivae are normal.  Neck: JVD (difficult to appreciate due to body habitus) present.  Cardiovascular: Normal rate and normal heart sounds. An irregular rhythm present.  No murmur heard. Respiratory: Effort normal and breath sounds normal. No respiratory distress. He has no wheezes.  GI: Soft. Bowel sounds are normal.  Musculoskeletal: He exhibits no edema.  Neurological: He is alert.  Skin: He is not diaphoretic. No erythema.  Psychiatric: He has a normal mood and affect. His behavior is normal. Judgment and thought content normal.   EKG: personally reviewed my interpretation is irregular rhythm, atrial fibrillation vs atrial flutter  CXR: personally reviewed my interpretation is vascular congestion and cardiomegaly.  Assessment & Plan by Problem:  Acute on chronic combined systolic and diastolic heart failure The patient's last echo was done 06/16/15 which showed LVEF 40-45%, mild left ventricular hypertrophy, grade 1 diastolic dysfunction.  The patient's heart failure exacerbation is likely to be prompted due to medication noncompliance versus possible acute ischemic event. The patient has a bnp of 991, weight of 325lbs up from dry weight~290lbs, lactic  acid of 2.9, jvd could not be assessed, crackles not appreciated.   -IV lasix 40mg  bid -Monitor I/O  -The patient's troponin is mildly elevated at 0.13. Will trend troponin -EKG in a.m. of 06/07/2017 -Pending TSH -pending echo  Atrial Fibrillation The patient's ekg's seem consistent with atrial fibrillation and on auscultation the patient had an irregular rhythm as well. The patient has a CHADSVASC score of 4 points (CHF, DM, and HTN). The patient has osa and heart failure which place him at risk for a fib.   -Heparin -started coreg 6.25mg  bid  Acute renal failure Presented with a creatinine of 6.2 which he usually has baseline of 1-1.1.  It is suspected that the patient's acute renal failure may be due to his acute heart failure exacerbation or possibly due to post renal BPH causing obstruction.  -Pending urine studies -BMP in the morning of 06/07/2017 -appreciate nephrology recs  BPH The patient has bph that was diagnosed by urology in 2012.  -Ultrasound renal -Pending urinalysis -BMP morning of 06/07/2016 -urine culture pending  Diabetes mellitus type 2 The patient's last HgBA1c=7.8 in October 2018. The patient presented with fasting glucose in 200s.  The patient is on glargine 56 units, metformin 1000mg  bid at home.  -Pending hemoglobin A1c  Constipation  -miralax -senna  Dispo: Admit patient to Inpatient with expected length of stay greater than 2 midnights.  Signed: [name], MD Internal Medicine PGY1 [phone] 06/06/2017, 5:29 PM

## 2017-06-06 NOTE — ED Triage Notes (Signed)
Pt states 4 days of abdominal pain, unable to eat, abdominal distension, shortness of breath. Pt states heart hx. Pt appears in discomfort, rates his pain 10/10. States "I cannot get a good breath." Sent from outpatient clinic.

## 2017-06-06 NOTE — ED Notes (Signed)
Gave pt Diet Ginger Ale, per Admitting Staff.

## 2017-06-07 ENCOUNTER — Ambulatory Visit: Payer: Self-pay

## 2017-06-07 ENCOUNTER — Inpatient Hospital Stay (HOSPITAL_COMMUNITY): Payer: Self-pay

## 2017-06-07 ENCOUNTER — Encounter (HOSPITAL_COMMUNITY): Payer: Self-pay | Admitting: Cardiology

## 2017-06-07 DIAGNOSIS — I4891 Unspecified atrial fibrillation: Secondary | ICD-10-CM

## 2017-06-07 DIAGNOSIS — E872 Acidosis: Secondary | ICD-10-CM

## 2017-06-07 DIAGNOSIS — I110 Hypertensive heart disease with heart failure: Principal | ICD-10-CM

## 2017-06-07 DIAGNOSIS — I5043 Acute on chronic combined systolic (congestive) and diastolic (congestive) heart failure: Secondary | ICD-10-CM

## 2017-06-07 DIAGNOSIS — N320 Bladder-neck obstruction: Secondary | ICD-10-CM

## 2017-06-07 LAB — CBC WITH DIFFERENTIAL/PLATELET
BASOS ABS: 0 10*3/uL (ref 0.0–0.1)
Basophils Relative: 0 %
EOS ABS: 0 10*3/uL (ref 0.0–0.7)
EOS PCT: 0 %
HCT: 44.1 % (ref 39.0–52.0)
HEMOGLOBIN: 14.8 g/dL (ref 13.0–17.0)
LYMPHS ABS: 2.1 10*3/uL (ref 0.7–4.0)
LYMPHS PCT: 9 %
MCH: 27 pg (ref 26.0–34.0)
MCHC: 33.6 g/dL (ref 30.0–36.0)
MCV: 80.3 fL (ref 78.0–100.0)
Monocytes Absolute: 2.6 10*3/uL (ref 0.1–1.0)
Monocytes Relative: 12 %
NEUTROS PCT: 79 %
Neutro Abs: 17.6 10*3/uL (ref 1.7–7.7)
PLATELETS: 105 10*3/uL — AB (ref 150–400)
RBC: 5.49 MIL/uL (ref 4.22–5.81)
RDW: 14.7 % (ref 11.5–15.5)
WBC: 22.3 10*3/uL — AB (ref 4.0–10.5)

## 2017-06-07 LAB — TROPONIN I: TROPONIN I: 0.23 ng/mL — AB (ref <0.03)

## 2017-06-07 LAB — GLUCOSE, CAPILLARY
GLUCOSE-CAPILLARY: 221 mg/dL — AB (ref 65–99)
GLUCOSE-CAPILLARY: 188 mg/dL — AB (ref 65–99)
GLUCOSE-CAPILLARY: 174 mg/dL — AB (ref 65–99)
Glucose-Capillary: 158 mg/dL — ABNORMAL HIGH (ref 65–99)

## 2017-06-07 LAB — BLOOD GAS, ARTERIAL
ACID-BASE EXCESS: 0.8 mmol/L (ref 0.0–2.0)
Bicarbonate: 24.7 mmol/L (ref 20.0–28.0)
Drawn by: 511851
FIO2: 32
O2 SAT: 96.5 %
PCO2 ART: 37.9 mmHg (ref 32.0–48.0)
PH ART: 7.429 (ref 7.350–7.450)
PO2 ART: 87.4 mmHg (ref 83.0–108.0)
Patient temperature: 98.6

## 2017-06-07 LAB — COMPREHENSIVE METABOLIC PANEL
ALBUMIN: 2.8 g/dL — AB (ref 3.5–5.0)
ALT: 18 U/L (ref 17–63)
ANION GAP: 17 — AB (ref 5–15)
AST: 24 U/L (ref 15–41)
Alkaline Phosphatase: 69 U/L (ref 38–126)
BILIRUBIN TOTAL: 1.4 mg/dL — AB (ref 0.3–1.2)
BUN: 56 mg/dL — AB (ref 6–20)
CHLORIDE: 94 mmol/L — AB (ref 101–111)
CO2: 19 mmol/L — ABNORMAL LOW (ref 22–32)
Calcium: 8.5 mg/dL — ABNORMAL LOW (ref 8.9–10.3)
Creatinine, Ser: 6.9 mg/dL — ABNORMAL HIGH (ref 0.61–1.24)
GFR calc Af Amer: 9 mL/min — ABNORMAL LOW (ref >60)
GFR calc non Af Amer: 8 mL/min — ABNORMAL LOW (ref >60)
GLUCOSE: 208 mg/dL — AB (ref 65–99)
Potassium: 5 mmol/L (ref 3.5–5.1)
Sodium: 130 mmol/L — ABNORMAL LOW (ref 135–145)
TOTAL PROTEIN: 6.4 g/dL — AB (ref 6.5–8.1)

## 2017-06-07 LAB — BETA-HYDROXYBUTYRIC ACID: Beta-Hydroxybutyric Acid: 0.09 mmol/L (ref 0.05–0.27)

## 2017-06-07 LAB — RENAL FUNCTION PANEL
ALBUMIN: 2.3 g/dL — AB (ref 3.5–5.0)
ANION GAP: 13 (ref 5–15)
BUN: 53 mg/dL — AB (ref 6–20)
CALCIUM: 8.2 mg/dL — AB (ref 8.9–10.3)
CO2: 22 mmol/L (ref 22–32)
CREATININE: 4.31 mg/dL — AB (ref 0.61–1.24)
Chloride: 100 mmol/L — ABNORMAL LOW (ref 101–111)
GFR calc Af Amer: 16 mL/min — ABNORMAL LOW (ref >60)
GFR calc non Af Amer: 14 mL/min — ABNORMAL LOW (ref >60)
GLUCOSE: 131 mg/dL — AB (ref 65–99)
PHOSPHORUS: 4.9 mg/dL — AB (ref 2.5–4.6)
Potassium: 4.3 mmol/L (ref 3.5–5.1)
Sodium: 135 mmol/L (ref 135–145)

## 2017-06-07 LAB — ECHOCARDIOGRAM COMPLETE
Height: 73 in
WEIGHTICAEL: 4973.58 [oz_av]

## 2017-06-07 LAB — HEPARIN LEVEL (UNFRACTIONATED)
HEPARIN UNFRACTIONATED: 0.26 [IU]/mL — AB (ref 0.30–0.70)
HEPARIN UNFRACTIONATED: 0.17 [IU]/mL — AB (ref 0.30–0.70)

## 2017-06-07 LAB — HIV ANTIBODY (ROUTINE TESTING W REFLEX): HIV Screen 4th Generation wRfx: NONREACTIVE

## 2017-06-07 MED ORDER — DILTIAZEM HCL 25 MG/5ML IV SOLN
15.0000 mg | Freq: Once | INTRAVENOUS | Status: AC
  Administered 2017-06-07: 15 mg via INTRAVENOUS
  Filled 2017-06-07: qty 5

## 2017-06-07 MED ORDER — AMIODARONE IV BOLUS ONLY 150 MG/100ML
150.0000 mg | Freq: Once | INTRAVENOUS | Status: AC
  Administered 2017-06-07: 150 mg via INTRAVENOUS
  Filled 2017-06-07: qty 100

## 2017-06-07 MED ORDER — DILTIAZEM LOAD VIA INFUSION: 15.0000 mg | Freq: Once | INTRAVENOUS | Status: DC

## 2017-06-07 MED ORDER — SODIUM CHLORIDE 0.9% FLUSH
10.0000 mL | Freq: Two times a day (BID) | INTRAVENOUS | Status: DC
  Administered 2017-06-08: 20 mL
  Administered 2017-06-09 – 2017-06-14 (×4): 10 mL

## 2017-06-07 MED ORDER — DILTIAZEM HCL 25 MG/5ML IV SOLN
10.0000 mg | Freq: Once | INTRAVENOUS | Status: AC
  Administered 2017-06-07: 10 mg via INTRAVENOUS
  Filled 2017-06-07: qty 5

## 2017-06-07 MED ORDER — LIDOCAINE HCL 1 % IJ SOLN: 5.0000 mL | Freq: Once | INTRAMUSCULAR | Status: DC

## 2017-06-07 MED ORDER — GI COCKTAIL ~~LOC~~
30.0000 mL | Freq: Three times a day (TID) | ORAL | Status: DC | PRN
  Administered 2017-06-07: 30 mL via ORAL
  Filled 2017-06-07 (×2): qty 30

## 2017-06-07 MED ORDER — DILTIAZEM HCL-DEXTROSE 100-5 MG/100ML-% IV SOLN (PREMIX): 5.0000 mg/h | INTRAVENOUS | Status: DC

## 2017-06-07 MED ORDER — HEPARIN (PORCINE) IN NACL 100-0.45 UNIT/ML-% IJ SOLN
2150.0000 [IU]/h | INTRAMUSCULAR | Status: DC
  Administered 2017-06-08: 2300 [IU]/h via INTRAVENOUS
  Filled 2017-06-07 (×2): qty 250

## 2017-06-07 MED ORDER — AMIODARONE HCL IN DEXTROSE 360-4.14 MG/200ML-% IV SOLN
30.0000 mg/h | INTRAVENOUS | Status: DC
  Administered 2017-06-08 (×2): 30 mg/h via INTRAVENOUS
  Filled 2017-06-07 (×3): qty 200

## 2017-06-07 MED ORDER — INSULIN ASPART 100 UNIT/ML ~~LOC~~ SOLN
0.0000 [IU] | Freq: Three times a day (TID) | SUBCUTANEOUS | Status: DC
  Administered 2017-06-07 (×2): 2 [IU] via SUBCUTANEOUS
  Administered 2017-06-08: 9 [IU] via SUBCUTANEOUS
  Administered 2017-06-08 – 2017-06-09 (×4): 1 [IU] via SUBCUTANEOUS
  Administered 2017-06-11 – 2017-06-12 (×2): 2 [IU] via SUBCUTANEOUS
  Administered 2017-06-12: 1 [IU] via SUBCUTANEOUS
  Administered 2017-06-13: 2 [IU] via SUBCUTANEOUS
  Administered 2017-06-13: 1 [IU] via SUBCUTANEOUS
  Administered 2017-06-13 – 2017-06-14 (×2): 2 [IU] via SUBCUTANEOUS

## 2017-06-07 MED ORDER — SODIUM CHLORIDE 0.9% FLUSH
3.0000 mL | Freq: Two times a day (BID) | INTRAVENOUS | Status: DC
  Administered 2017-06-07 – 2017-06-14 (×6): 3 mL via INTRAVENOUS

## 2017-06-07 MED ORDER — LIDOCAINE HCL (PF) 1 % IJ SOLN: 2.0000 mL | Freq: Once | INTRAMUSCULAR | Status: DC

## 2017-06-07 MED ORDER — SODIUM CHLORIDE 0.9% FLUSH: 3.0000 mL | INTRAVENOUS | Status: DC | PRN

## 2017-06-07 MED ORDER — LIDOCAINE HCL 2 % EX GEL
1.0000 "application " | Freq: Once | CUTANEOUS | Status: AC
  Administered 2017-06-07: 1 via URETHRAL
  Filled 2017-06-07: qty 5

## 2017-06-07 MED ORDER — SODIUM CHLORIDE 0.9 % IV SOLN
250.0000 mL | INTRAVENOUS | Status: DC
  Administered 2017-06-08: 09:00:00 via INTRAVENOUS

## 2017-06-07 MED ORDER — SODIUM CHLORIDE 0.9% FLUSH: 10.0000 mL | INTRAVENOUS | Status: DC | PRN

## 2017-06-07 MED ORDER — POLYETHYLENE GLYCOL 3350 17 G PO PACK: 17.0000 g | PACK | Freq: Every day | ORAL | Status: DC | PRN

## 2017-06-07 MED ORDER — DILTIAZEM LOAD VIA INFUSION
10.0000 mg | Freq: Once | INTRAVENOUS | Status: DC
  Filled 2017-06-07: qty 10

## 2017-06-07 MED ORDER — PANTOPRAZOLE SODIUM 40 MG PO TBEC
40.0000 mg | DELAYED_RELEASE_TABLET | Freq: Every day | ORAL | Status: DC
  Administered 2017-06-07 – 2017-06-14 (×7): 40 mg via ORAL
  Filled 2017-06-07 (×7): qty 1

## 2017-06-07 MED ORDER — PHENOL 1.4 % MT LIQD
1.0000 | OROMUCOSAL | Status: DC | PRN
  Administered 2017-06-07: 1 via OROMUCOSAL
  Filled 2017-06-07 (×2): qty 177

## 2017-06-07 MED ORDER — AMIODARONE HCL IN DEXTROSE 360-4.14 MG/200ML-% IV SOLN
60.0000 mg/h | INTRAVENOUS | Status: AC
  Administered 2017-06-07: 60 mg/h via INTRAVENOUS

## 2017-06-07 MED ORDER — LIDOCAINE HCL (PF) 1 % IJ SOLN
2.0000 mL | Freq: Once | INTRAMUSCULAR | Status: DC
  Filled 2017-06-07: qty 5

## 2017-06-07 NOTE — Progress Notes (Signed)
Unable to give Amiodarone Bolus or Amiodarone Drip because patient had no IV access. IV team is in room with patient putting in a PICC line. Unable to give insuline or heparin as IV team putting in a PICC line and it is a sterile procedure. Will report this to the night shift nurse so medications can be given once PICC line is established.

## 2017-06-07 NOTE — Psychosocial Assessment (Signed)
MD called about HR in 140s, Cardizem 10mg  IV ordered. Will follow orders and continue to monitor.   Chang Tiggs M

## 2017-06-07 NOTE — Progress Notes (Signed)
Patient currently needing an Amiodarone Bolus, Amiodarone drip. Patient has Heparin drip going through his only IV in Right AC. IV team unable to gain access for PIV. Doctor [name] put in order for PICC line. Spoke with IV team and they stated that patient is being followed Nephrology and we need approval from provider to get PICC Line. Text Paged Dr. Moshe Cipro and will await her decision. Will continue to monitor patient's heart rate.

## 2017-06-07 NOTE — Consult Note (Signed)
Cardiology Consultation:   Patient ID: Terry Parks; [id number]; [birth year]   Admit date: 06/06/2017 Date of Consult: 06/07/2017  Primary Care Provider: [name], MD Primary Cardiologist: [name], MD  Dr. Claiborne Billings for OSA   Patient Profile:   Terry Parks is a 62 y.o. male with a hx of HTN cardiomyopathy, combined systolic and diastolic heart failure, HTN, HLD, DM, BPH, and cardiomegaly who is being seen today for the evaluation of Afib at the request of Dr. Evette Doffing.  History of Present Illness:   Terry Parks is known to this service. He sees Dr. Sallyanne Kuster for general cardiology and Dr. Claiborne Billings for OSA. He last saw Dr. Sallyanne Kuster on 06/05/15. At that time, dry weight was noted to be 295-300 lbs. He was asked to increase his lasix to 80 mg daily. He has also been diagnosed with severe OSA with CPAP recommendations.   He presented to [facility] with complaints of 2-week history of increased DOE, bilateral leg swelling and increased weight of 20 lbs. He does not weigh regularly at home. He also reported nonproductive cough and congestion.  He was admitted to medicine service for IV diuresis. On my interview, he states he was compliant with his medications, including lasix, but suffered urinary retention. He is being worked up for bladder mass and enlarged prostate. Foley catheter was inserted with 2-3 L urine output.   Last evening at 2100, he was noted to be in Aflutter/fib with poor rate control. Telemetry review with fib/flutter with rates in the 150s.  Coreg was increased to 25 mg BID. HE received 2 injections of IV diltiazem 10-15 mg without resolution of RVR.  Primary team started amiodarone and avoided AV node blocking agents due to hypotension. Per Epic, hypotension was not recorded but monitor in room with systolic pressure 97 with HR 130.   He is largely unaware of his rhythm and does not know if he has a history of Afib. He denies dizziness and lightheadedness. He reports chest  pain that is associated with cough. Troponin are positive 0.24 --> 0.23. EKG with Afib, no acute signs of ischemia.    Past Medical History:  Diagnosis Date  . Abscess of groin   . Abscessed tooth   . Allergic rhinitis   . BPH (benign prostatic hypertrophy)    Massive BPH noted on cystoscopy 1/23/ 2012 by Dr. Risa Grill.  . Cardiomegaly    Mild by CXR 11/04/04  . Diabetes mellitus 04/08/2008  . Headache(784.0)   . Hyperlipemia   . Hypertension   . Hypertensive cardiopathy 03/01/2006   2-D echocardiogram 02/01/2012 showed moderate LVH, mildly to moderately reduced left ventricular systolic function with an estimated ejection fraction of 40-45%, and diffuse hypokinesis.  A nuclear medicine stress study done 01/31/2012 showed no reversible ischemia, a small mid anterior wall fixed defect/infarct, and ejection fraction 42%.      . Neck pain   . Obstructive sleep apnea 03/06/2008   Sleep study 03/06/08 showed severe OSA/hypopnea syndrome, with successful CPAP titration to 13 CWP using a medium ResMed Mirage Quattro full face mask with heated humidifier.   . Renal calculus 05/29/2010   CT scan of abdomen/pelvis on 05/29/2010 showed an obstructing approximate 1-2 mm calculus at the left UVJ, and an approximate 1-2 mm left lower pole renal calculus.   Patient had continuing severe pain , and an elevation of his serum creatinine to a value of 1.75 on 06/06/2010.  The stone had apparently passed and was not seen on repeat CT 06/08/2010.  Past Surgical History:  Procedure Laterality Date  . CYSTOSCOPY W/ RETROGRADES       Home Medications:  Prior to Admission medications   Medication Sig Start Date End Date Taking? Authorizing Provider  amLODipine-olmesartan (AZOR) 5-40 MG tablet Take 1 tablet by mouth daily. 05/30/17  Yes Rice, Resa Miner, MD  Armodafinil (NUVIGIL) 150 MG tablet Take 1 tablet (150 mg total) by mouth daily. As needed. Patient taking differently: Take 150 mg by mouth daily.  06/12/15   Yes Troy Sine, MD  aspirin 81 MG EC tablet Take 81 mg by mouth daily.    Yes [provider]  atorvastatin (LIPITOR) 40 MG tablet Take 1 tablet (40 mg total) by mouth daily. 12/29/16  Yes Aldine Contes, MD  carvedilol (COREG) 25 MG tablet Take 1 tablet (25 mg total) by mouth 2 (two) times daily. 05/20/17 06/19/17 Yes [name], Vahini, MD  DULoxetine (CYMBALTA) 60 MG capsule Take 1 capsule (60 mg total) by mouth daily. 05/30/17 05/30/18 Yes Rice, Resa Miner, MD  empagliflozin (JARDIANCE) 10 MG TABS tablet Take 10 mg by mouth daily. 10/07/15  Yes Riccardo Dubin, MD  furosemide (LASIX) 40 MG tablet Take 1 tablet (40 mg total) by mouth daily. Patient taking differently: Take 40 mg by mouth 2 (two) times daily.  10/21/16 10/21/17 Yes Troy Sine, MD  Insulin Glargine (LANTUS SOLOSTAR) 100 UNIT/ML Solostar Pen Inject 56 Units into the skin daily. Patient taking differently: Inject 60 Units into the skin daily.  05/20/17  Yes [name], Vahini, MD  Ipratropium-Albuterol (COMBIVENT) 20-100 MCG/ACT AERS respimat Inhale 1 puff into the lungs every 6 (six) hours as needed for wheezing or shortness of breath. 01/18/14  Yes Oval Linsey, MD  isosorbide mononitrate (IMDUR) 30 MG 24 hr tablet Take 1 tablet (30 mg total) by mouth daily. 11/02/16  Yes Riccardo Dubin, MD  metFORMIN (GLUCOPHAGE) 1000 MG tablet Take 1 tablet (1,000 mg total) by mouth 2 (two) times daily with a meal. 07/20/16  Yes Dellia Nims, MD  Misc. Devices MISC by Does not apply route. C-PAP   Yes [provider]  Cholecalciferol 2000 units TABS Take 1 tablet (2,000 Units total) by mouth daily. Patient not taking: Reported on 06/06/2017 11/06/16   Riccardo Dubin, MD  glucose blood (TRUETRACK TEST) test strip Use as directed to test blood sugar three times a day before meals. 10/04/12   Bertha Stakes, MD  Insulin Pen Needle (CARETOUCH PEN NEEDLES) 31G X 6 MM MISC 1 pen by Does not apply route at bedtime. 03/01/17   [name], MD   Lancets MISC Use to test blood sugar three times a day before meals. 09/14/13   Malena Catholic, MD    Inpatient Medications: Scheduled Meds: . DULoxetine  60 mg Oral Daily  . insulin aspart  0-9 Units Subcutaneous TID WC  . insulin glargine  40 Units Subcutaneous QHS  . pantoprazole  40 mg Oral Daily  . polyethylene glycol  17 g Oral Once   Continuous Infusions: . amiodarone    . amiodarone    . amiodarone    . heparin 2,000 Units/hr (06/07/17 0851)   PRN Meds: acetaminophen **OR** acetaminophen, gi cocktail, ondansetron, phenol, senna-docusate  Allergies:    Allergies  Allergen Reactions  . Lisinopril Cough    Social History:   Social History   Socioeconomic History  . Marital status: Single    Spouse name: Not on file  . Number of children: 4 b  .  Years of education: 66  . Highest education level: Not on file  Social Needs  . Financial resource strain: Not on file  . Food insecurity - worry: Not on file  . Food insecurity - inability: Not on file  . Transportation needs - medical: Not on file  . Transportation needs - non-medical: Not on file  Occupational History  . Occupation:        Employer: UNEMPLOYED  Tobacco Use  . Smoking status: Never Smoker  . Smokeless tobacco: Never Used  Substance and Sexual Activity  . Alcohol use: No    Alcohol/week: 0.0 oz  . Drug use: No  . Sexual activity: Not Currently  Other Topics Concern  . Not on file  Social History Narrative   Divorced, 4 children, lives alone.  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    Family History:    Family History  Problem Relation Age of Onset  . Breast cancer Mother   . Hypertension Father   . Diabetes Maternal Grandmother   . Colon cancer Neg Hx   . Prostate cancer Neg Hx   . Heart attack Neg Hx      ROS:  Please see the history of present illness.  ROS  All other ROS reviewed and negative.     Physical Exam/Data:   Vitals:   06/07/17 0021 06/07/17 0508 06/07/17  0710 06/07/17 1000  BP:  (!) 124/105 131/80 (P) 109/84  Pulse:  (!) 151 (!) 142 (!) (P) 126  Resp: (!) 22 (!) 23    Temp:  98.2 F (36.8 C)    TempSrc:  Axillary    SpO2:  95%    Weight:  (!) 310 lb 13.6 oz (141 kg)    Height:        Intake/Output Summary (Last 24 hours) at 06/07/2017 1204 Last data filed at 06/07/2017 0998 Gross per 24 hour  Intake 164.77 ml  Output 3450 ml  Net -3285.23 ml   Filed Weights   06/06/17 1700 06/06/17 2113 06/07/17 0508  Weight: (!) 325 lb 9.9 oz (147.7 kg) 288 lb (130.6 kg) (!) 310 lb 13.6 oz (141 kg)   Body mass index is 41.01 kg/m.  General:  Well nourished, well developed, in no acute distress HEENT: normal Neck: + JVD Vascular: No carotid bruits Cardiac:  Irregular rhythm, irregular rate, no mumur Lungs:  Cough, respirations unlabored but coarse sounds throughout  Abd: soft, nontender, no hepatomegaly  Ext: trace to 1+ edema Musculoskeletal:  No deformities, BUE and BLE strength normal and equal Skin: warm and dry  Neuro:  CNs 2-12 intact, no focal abnormalities noted Psych:  Normal affect   EKG:  The EKG was personally reviewed and demonstrates:  Afib Telemetry:  Telemetry was personally reviewed and demonstrates:  Afib 90-150s  Relevant CV Studies:  Echo pending  Echo 06/16/15: Study Conclusions - Procedure narrative: Transthoracic echocardiography. Image  quality was fair. Intravenous contrast (Definity) was   administered. - Left ventricle: The cavity size was normal. Wall thickness was   increased in a pattern of mild LVH. Systolic function was mildly   to moderately reduced. The estimated ejection fraction was in the   range of 40% to 45%. Diffuse hypokinesis. Doppler parameters are   consistent with abnormal left ventricular relaxation (grade 1   diastolic dysfunction). The E/e ratio is between 8-15, suggesting   indeterminate LV filling pressure. - Left atrium: The atrium was normal in size. - Inferior vena cava: The vessel  was normal in  size. The   respirophasic diameter changes were in the normal range (= 50%),   consistent with normal central venous pressure.  Impressions: - Compared to a prior study in 09/2014, the LVEF has declined to 40-45% (the LVEF in 2013 was around this value).   Myoview 11/28/14:  The left ventricular ejection fraction is severely decreased (<30%).  Nuclear stress EF: 25%.   Diaphragmatic attenuation with mild LV dilatation and severeal global HK C/W NISCM  Laboratory Data:  Chemistry Recent Labs  Lab 06/06/17 1042 06/07/17 0258  NA 132* 130*  K 4.9 5.0  CL 94* 94*  CO2 21* 19*  GLUCOSE 227* 208*  BUN 42* 56*  CREATININE 6.24* 6.90*  CALCIUM 8.7* 8.5*  GFRNONAA 9* 8*  GFRAA 10* 9*  ANIONGAP 17* 17*    Recent Labs  Lab 06/06/17 1042 06/07/17 0258  PROT 6.5 6.4*  ALBUMIN 3.1* 2.8*  AST 29 24  ALT 16* 18  ALKPHOS 67 69  BILITOT 1.8* 1.4*   Hematology Recent Labs  Lab 06/06/17 1042 06/07/17 0258  WBC 23.3* 22.3*  RBC 5.55 5.49  HGB 15.2 14.8  HCT 44.5 44.1  MCV 80.2 80.3  MCH 27.4 27.0  MCHC 34.2 33.6  RDW 14.8 14.7  PLT 120* 105*   Cardiac Enzymes Recent Labs  Lab 06/06/17 2208 06/07/17 0258  TROPONINI 0.24* 0.23*    Recent Labs  Lab 06/06/17 1053  TROPIPOC 0.13*    BNP Recent Labs  Lab 06/06/17 1042  BNP 991.6*    DDimer No results for input(s): DDIMER in the last 168 hours.  Radiology/Studies:  Dg Chest 2 View  Result Date: 06/06/2017 CLINICAL DATA:  Chest pain for 4 days. EXAM: CHEST  2 VIEW COMPARISON:  July 12, 2014 FINDINGS: The mediastinal contour is normal. The heart size is enlarged. There is mild central pulmonary vascular congestion. There is no focal pneumonia or pleural effusion. The visualized skeletal structures are unremarkable. IMPRESSION: Cardiomegaly. Mild central pulmonary vascular congestion. No frank pulmonary edema. Electronically Signed   By: Abelardo Diesel M.D.   On: 06/06/2017 10:11   US  Renal  Result Date: 06/06/2017 CLINICAL DATA:  Acute kidney injury EXAM: RENAL / URINARY TRACT ULTRASOUND COMPLETE COMPARISON:  CT 06/07/2010 FINDINGS: Right Kidney: Length: 13 cm. Cortical echogenicity is within normal limits. Mild right hydronephrosis and enlarged extrarenal pelvis. Left Kidney: Length: 12.6 cm.  Poorly visualized.  No definite hydronephrosis. Bladder: Bladder is distended, prevoid volume of 1334 cubic cm. Mildly echogenic mass within the bladder measuring 3.7 x 3.2 x 4.6 cm. IMPRESSION: 1. Mild right hydronephrosis 2. Poorly visible left kidney, but without definitive hydronephrosis 3. Distended urinary bladder. 4.6 cm echogenic mass in the bladder, could represent focal hematoma or hypovascular soft tissue mass. Correlation with direct visualization may be considered. Electronically Signed   By: Donavan Foil M.D.   On: 06/06/2017 20:34    Assessment and Plan:   1. Atrial fibrillation - amiodarone has been ordered, does not appear he has received amiodarone - agree with amiodarone bolus and drip - coreg has increased to 25 mg BID - diltiazem IV x 2 given without improvement in his RVR - not a great medication given his heart failure - would recommend starting lopressor 25 mg BID - last pressure in the 130s - agree with heparin drip for now if there are planned procedures - this is likely secondary to acute illness and acute on chronic combined CHF  - This patients CHA2DS2-VASc Score and unadjusted  Ischemic Stroke Rate (% per year) is equal to 3.2 % stroke rate/year from a score of 3 (CHF, HTN, DM) - monitor how pt responds to amiodarone - will tentatively plan for DCCV tomorrow  - NPO at MN   2. Acute on chronic systolic and diastolic heart failure - home medications include norvasc-olmesartan, coreg, lasix 40 mg BID, imdur - foley in place for urinary retention - he is overall net negative 3L with 2.2 L urine output yesterday - weight is 325 lbs, now 310 lbs - dry  weight is 295-300 lbs - he has received 40 mg IV lasix x 2 - agree with 40 mg IV lasix BID - improving volume status will likely improve his respiratory function and Afib RVR   3. HTN - hold norvasc, ARB, and imdur during IV diuresis and titrating meds for RVR   4. DM - per primary team   5. HLD - continue lipitor   For questions or updates, please contact [place] Please consult [email or web address] for contact info under Cardiology/STEMI.   Signed, Ledora Bottcher, PA  06/07/2017 12:04 PM   History and all data above reviewed.  Patient examined.  I agree with the findings as above.  The patient presents with progressive dyspnea.  He says that he has not been feeling well for a couple of weeks.  I suspect that it might be longer.  He has had family members buying his groceries.  However, he has not felt like eating and has had vomiting.  He came in when he started feeling like he could not get a deep breath and had some soreness with breathing.  He had severe urinary retention and acute kidney failure as noted above.  He has had no palpitations.  However, last night he went into atrial fib with rapid rate.  He does not notice this.  He does have a history of sleep apnea and a reduced EF.  He has not been seen in our office in a couple of years.  The patient exam reveals WYO:VZCHYIFOY  ,  Lungs: Decreased breath sounds at the bases.  ,  Abd: Positive bowel sounds, no rebound no guarding, Ext Mild edema  .  All available labs, radiology testing, previous records reviewed. Agree with documented assessment and plan. Atrial fib:  New onset atrial fib with RVR.  Start heparin.  I will suggest amiodarone IV as he has very rapid rate.  If he does not convert he will need DCCV in the morning.  Echo is pending.  Unable to use afterload reduction at this point with ARB or ACE inhibitor secondary to AKI.  OK to continue beta blocker although, per renal, we do not want to drop the BP excessively.     Jeneen Rinks Nashika Coker  3:14 PM  06/07/2017

## 2017-06-07 NOTE — Progress Notes (Addendum)
   Subjective: Terry Parks was seen laying in his bed this morning. He was extremely lethargic. He stated that he was having some indigestion and burping.   Objective:  Vital signs in last 24 hours: Vitals:   06/06/17 2113 06/06/17 2328 06/07/17 0021 06/07/17 0508  BP: (!) 173/107 (!) 162/132  (!) 124/105  Pulse: (!) 121 (!) 120  (!) 151  Resp:  (!) 26 (!) 22 (!) 23  Temp: 97.6 F (36.4 C) 97.7 F (36.5 C)  98.2 F (36.8 C)  TempSrc: Oral Tympanic  Axillary  SpO2: 100% 95%  95%  Weight: 288 lb (130.6 kg)   (!) 310 lb 13.6 oz (141 kg)  Height: 6\' 1"  (1.854 m)      Physical Exam  Constitutional: He appears well-developed and well-nourished. He appears lethargic. He has a sickly appearance. No distress.  HENT:  Head: Normocephalic and atraumatic.  Eyes: Conjunctivae are normal.  Cardiovascular: Normal rate, regular rhythm, normal heart sounds and intact distal pulses.  Respiratory: Effort normal and breath sounds normal. No respiratory distress. He has no wheezes.  GI: Soft. Bowel sounds are normal. He exhibits distension. There is no tenderness.  Musculoskeletal: He exhibits no edema.  Cool distal extremities, no pitting edema   Neurological: He appears lethargic.  Skin: He is not diaphoretic. No erythema.    Assessment/Plan:  Acute on chronic combined systolic and diastolic heart failure The patient has had -2L net outfit yesterday and -1.2L today 06/07/17.  He patient's weight today is 310lbs from 325lbs on admission. The patient has a dry weight of approximately 290lbs. On exam the patient appears hypervolemic per gut edema and fluid in lungs.   -Echo shows severely dilated left atrium and moderately dilated right atrium. Couldn't determine EF. -DCCV scheduled for 06/08/17 tomorrow AM which should help with heart failure -IV lasix 40mg  bid stopped will monitor with autodiuresis -continue to Monitor I/O  -Troponin plateaued at 0.23 -Pending TSH  Atrial Fibrillation The  patient continues to remain in atrial fibrillation on telemetry. The atrial fibrillation is likely secondary to his heart failure exacerbation. The patient received 2 injections of IV diltiazem 10-15mg  on 06/06/17. CHA2DS2-VASc=3.  Plan is to [place] 06/08/17 with TEE as the patient's irregular rhythm is likely secondary to his acute heart failure exacerbation.   -continue heparin -stopped aspirin -coreg switched to metoprolol  -Heparin -Amiodarone bolus and drip  Anion Gap Metabolic Acidosis The patient has a lactic acidosis=2.9 which is likely secondary to uremia and the patient being on Jardiance (SGLT2).   -beta-hydroxybutyric acid is normal at 0.09  Acute renal failure Presented with a creatinine of 6.2 which he usually has baseline of 1-1.1 that is most likely secondary to obstruction.   -Renal ultrasound showed mild right hydronephrosis -urine studies pending -pending repeat bmp in pm of 06/07/17  Diabetes mellitus type 2 The patient's blood glucose has been ranging 158-221. HgA1c=8.1  -continue Lantus 40units  -Added SSI  Constipation  -GI cocktail -continue miralax -continue senna   Dispo: Anticipated discharge in approximately 2-3 day(s).   [name], MD Internal Medicine PGY1 Pager:[phone] 06/07/2017, 6:48 AM

## 2017-06-07 NOTE — Progress Notes (Addendum)
Inpatient Diabetes Program Recommendations  AACE/ADA: New Consensus Statement on Inpatient Glycemic Control (2015)  Target Ranges:  Prepandial:   less than 140 mg/dL      Peak postprandial:   less than 180 mg/dL (1-2 hours)      Critically ill patients:  140 - 180 mg/dL   Lab Results  Component Value Date   GLUCAP 174 (H) 06/07/2017   HGBA1C 8.1 (H) 06/06/2017    Review of Glycemic Control  Results for Terry Parks, Terry Parks (MRN [id number]) as of 06/07/2017 08:15  Ref. Range 06/06/2017 21:31 06/07/2017 02:25 06/07/2017 06:01 06/07/2017 07:20  Glucose-Capillary Latest Ref Range: 65 - 99 mg/dL 185 (H) 221 (H) 188 (H) 174 (H)     Diabetes history: Type 2 Outpatient Diabetes medications: Jardience 10mg  qday, Lantus 60 units qhs, Metformin 1000mg  bid Current orders for Inpatient glycemic control: Novolog 0-9 units tid, Lantus 40 units qhs,   Inpatient Diabetes Program Recommendations: Consider adding Novolog 0-5 units qhs  Gentry Fitz, RN, [place], [place], CDE Diabetes Coordinator Inpatient Diabetes Program  [phone] (Team Pager) [phone] ([place]) 06/07/2017 8:17 AM

## 2017-06-07 NOTE — Progress Notes (Signed)
Peripherally Inserted Central Catheter/Midline Placement  The IV Nurse has discussed with the patient and/or persons authorized to consent for the patient, the purpose of this procedure and the potential benefits and risks involved with this procedure.  The benefits include less needle sticks, lab draws from the catheter, and the patient may be discharged home with the catheter. Risks include, but not limited to, infection, bleeding, blood clot (thrombus formation), and puncture of an artery; nerve damage and irregular heartbeat and possibility to perform a PICC exchange if needed/ordered by physician.  Alternatives to this procedure were also discussed.  Bard Power PICC patient education guide, fact sheet on infection prevention and patient information card has been provided to patient /or left at bedside.    PICC/Midline Placement Documentation  PICC Double Lumen 06/07/17 PICC Right Brachial 42 cm 1 cm (Active)  Indication for Insertion or Continuance of Line Vasoactive infusions 06/07/2017  7:00 PM  Exposed Catheter (cm) 1 cm 06/07/2017  7:00 PM  Site Assessment Clean;Dry;Intact 06/07/2017  7:00 PM  Lumen #1 Status Flushed;Blood return noted 06/07/2017  7:00 PM  Lumen #2 Status Flushed;Blood return noted 06/07/2017  7:00 PM  Dressing Type Transparent 06/07/2017  7:00 PM  Dressing Status Clean;Dry;Intact;Antimicrobial disc in place 06/07/2017  7:00 PM  Dressing Change Due 06/14/17 06/07/2017  7:00 PM       Jule Economy Horton 06/07/2017, 7:02 PM

## 2017-06-07 NOTE — Progress Notes (Signed)
  Echocardiogram 2D Echocardiogram has been performed.  Terry Parks Terry Parks 06/07/2017, 2:35 PM

## 2017-06-07 NOTE — Progress Notes (Signed)
Dr. Moshe Cipro called and gave approval for PICC line. Put in order for PICC line and notified IV team of nephrologist approval of line.

## 2017-06-07 NOTE — Progress Notes (Signed)
[facility] for Heparin Indication: Atrial flutter  Allergies  Allergen Reactions  . Lisinopril Cough    Patient Measurements: Height: 6\' 1"  (185.4 cm) Weight: (!) 310 lb 13.6 oz (141 kg) IBW/kg (Calculated) : 79.9 Heparin Dosing Weight: 114kg  Vital Signs: Temp: 98.2 F (36.8 C) (01/22 0508) Temp Source: Axillary (01/22 0508) BP: 131/80 (01/22 0710) Pulse Rate: 142 (01/22 0710)  Labs: Recent Labs    06/06/17 1042 06/06/17 2208 06/06/17 2219 06/07/17 0258 06/07/17 0705  HGB 15.2  --   --  14.8  --   HCT 44.5  --   --  44.1  --   PLT 120*  --   --  105*  --   HEPARINUNFRC  --   --  0.18*  --  0.26*  CREATININE 6.24*  --   --  6.90*  --   TROPONINI  --  0.24*  --  0.23*  --     Estimated Creatinine Clearance: 17 mL/min (A) (by C-G formula based on SCr of 6.9 mg/dL (H)).   Medical History: Past Medical History:  Diagnosis Date  . Abscess of groin   . Abscessed tooth   . Allergic rhinitis   . BPH (benign prostatic hypertrophy)    Massive BPH noted on cystoscopy 1/23/ 2012 by Dr. Risa Grill.  . Cardiomegaly    Mild by CXR 11/04/04  . Diabetes mellitus 04/08/2008  . Headache(784.0)   . Hyperlipemia   . Hypertension   . Hypertensive cardiopathy 03/01/2006   2-D echocardiogram 02/01/2012 showed moderate LVH, mildly to moderately reduced left ventricular systolic function with an estimated ejection fraction of 40-45%, and diffuse hypokinesis.  A nuclear medicine stress study done 01/31/2012 showed no reversible ischemia, a small mid anterior wall fixed defect/infarct, and ejection fraction 42%.      . Neck pain   . Obstructive sleep apnea 03/06/2008   Sleep study 03/06/08 showed severe OSA/hypopnea syndrome, with successful CPAP titration to 13 CWP using a medium ResMed Mirage Quattro full face mask with heated humidifier.   . Renal calculus 05/29/2010   CT scan of abdomen/pelvis on 05/29/2010 showed an obstructing approximate 1-2 mm  calculus at the left UVJ, and an approximate 1-2 mm left lower pole renal calculus.   Patient had continuing severe pain , and an elevation of his serum creatinine to a value of 1.75 on 06/06/2010.  The stone had apparently passed and was not seen on repeat CT 06/08/2010.   Assessment: 61yom to begin heparin for new aflutter. AKI noted. CBC wnl. No anticoagulants pta.   Follow up heparin level this morning is still is sub-therapeutic at 0.26. CBC stable overnight, no bleeding issues noted.  Goal of Therapy:  Heparin level 0.3-0.7 units/ml Monitor platelets by anticoagulation protocol: Yes   Plan:  Increase heparin drip to 2000 units/hr Heparin level this afternoon  Erin Hearing PharmD., BCPS Clinical Pharmacist 06/07/2017 8:44 AM

## 2017-06-07 NOTE — Progress Notes (Signed)
Pt is scheduled for DCCV tomorrow at 9AM with Dr. Johnsie Cancel. NPO at [facility] please.

## 2017-06-07 NOTE — Progress Notes (Signed)
Subjective:  Events noted- was noted to be obstructed and also with possible bladder mass- after foley- has put out over 3 liters of UOP and BP has dropped - in Afib Objective Vital signs in last 24 hours: Vitals:   06/06/17 2328 06/07/17 0021 06/07/17 0508 06/07/17 0710  BP: (!) 162/132  (!) 124/105 131/80  Pulse: (!) 120  (!) 151 (!) 142  Resp: (!) 26 (!) 22 (!) 23   Temp: 97.7 F (36.5 C)  98.2 F (36.8 C)   TempSrc: Tympanic  Axillary   SpO2: 95%  95%   Weight:   (!) 141 kg (310 lb 13.6 oz)   Height:       Weight change:   Intake/Output Summary (Last 24 hours) at 06/07/2017 4081 Last data filed at 06/07/2017 4481 Gross per 24 hour  Intake 164.77 ml  Output 3450 ml  Net -3285.23 ml    Assessment/Plan: 62 year old black male with acute kidney injury onset the last 8 days in the setting of Lasix 40 mg twice daily and he says no other medications.  Also urinary obstructive symptoms 1.Renal-creatinine increase from 1.3 to over 6 in 7 days.  Was being diuresed but sounds like it was appropriate and not excessive as he was still volume overloaded.  He denied any other nephrotoxins.    He refused Foley catheter initially but u/s showed hydro- after foley cath- had 2 liters, followed by another liter of UOP.  Since this is an acute onset I am hoping it will have reversibility.  Most recent labs obtained prior to foley- think numbers will trend in good direction from now on- will check labs this PM.  Be careful not to drop BP too much- have stopped lasix- be cautious with meds for A fib.  Will stop lasix as I feel will autodiurese 2. Hypertension/volume  -he now is hypotensive.  He is volume overloaded but making urine.  He has not been taking his combination blood pressure medication with an ARB-would not challenge with an ACE or ARB at this time. Gentle meds to help with afib- may need to consult cards if BP is too low and needs amio 3. Anemia  -not an issue at this time 4.  Diabetes-it is  pretty impressive that his sugar was only 227 if he really has been without insulin or any other medication for a month 5. Hydro and bladder mass- will need to get urology input at some point      [facility]: Basic Metabolic Panel: Recent Labs  Lab 06/06/17 1042 06/07/17 0258  NA 132* 130*  K 4.9 5.0  CL 94* 94*  CO2 21* 19*  GLUCOSE 227* 208*  BUN 42* 56*  CREATININE 6.24* 6.90*  CALCIUM 8.7* 8.5*   Liver Function Tests: Recent Labs  Lab 06/06/17 1042 06/07/17 0258  AST 29 24  ALT 16* 18  ALKPHOS 67 69  BILITOT 1.8* 1.4*  PROT 6.5 6.4*  ALBUMIN 3.1* 2.8*   Recent Labs  Lab 06/06/17 1042  LIPASE 19   No results for input(s): AMMONIA in the last 168 hours. CBC: Recent Labs  Lab 06/06/17 1042 06/07/17 0258  WBC 23.3* 22.3*  NEUTROABS  --  17.6  HGB 15.2 14.8  HCT 44.5 44.1  MCV 80.2 80.3  PLT 120* 105*   Cardiac Enzymes: Recent Labs  Lab 06/06/17 2208 06/07/17 0258  TROPONINI 0.24* 0.23*   CBG: Recent Labs  Lab 06/06/17 2131 06/07/17 0225 06/07/17 0601  06/07/17 0720  GLUCAP 185* 221* 188* 174*    Iron Studies: No results for input(s): IRON, TIBC, TRANSFERRIN, FERRITIN in the last 72 hours. Studies/Results: Dg Chest 2 View  Result Date: 06/06/2017 CLINICAL DATA:  Chest pain for 4 days. EXAM: CHEST  2 VIEW COMPARISON:  July 12, 2014 FINDINGS: The mediastinal contour is normal. The heart size is enlarged. There is mild central pulmonary vascular congestion. There is no focal pneumonia or pleural effusion. The visualized skeletal structures are unremarkable. IMPRESSION: Cardiomegaly. Mild central pulmonary vascular congestion. No frank pulmonary edema. Electronically Signed   By: Abelardo Diesel M.D.   On: 06/06/2017 10:11   US Renal  Result Date: 06/06/2017 CLINICAL DATA:  Acute kidney injury EXAM: RENAL / URINARY TRACT ULTRASOUND COMPLETE COMPARISON:  CT 06/07/2010 FINDINGS: Right Kidney: Length: 13 cm. Cortical  echogenicity is within normal limits. Mild right hydronephrosis and enlarged extrarenal pelvis. Left Kidney: Length: 12.6 cm.  Poorly visualized.  No definite hydronephrosis. Bladder: Bladder is distended, prevoid volume of 1334 cubic cm. Mildly echogenic mass within the bladder measuring 3.7 x 3.2 x 4.6 cm. IMPRESSION: 1. Mild right hydronephrosis 2. Poorly visible left kidney, but without definitive hydronephrosis 3. Distended urinary bladder. 4.6 cm echogenic mass in the bladder, could represent focal hematoma or hypovascular soft tissue mass. Correlation with direct visualization may be considered. Electronically Signed   By: Donavan Foil M.D.   On: 06/06/2017 20:34   Medications: Infusions: . amiodarone    . amiodarone    . amiodarone    . heparin 2,000 Units/hr (06/07/17 0086)    Scheduled Medications: . aspirin EC  81 mg Oral Daily  . carvedilol  25 mg Oral BID WC  . DULoxetine  60 mg Oral Daily  . furosemide  40 mg Intravenous BID  . insulin aspart  0-9 Units Subcutaneous TID WC  . insulin glargine  40 Units Subcutaneous QHS  . isosorbide mononitrate  30 mg Oral Daily  . polyethylene glycol  17 g Oral Once    have reviewed scheduled and prn medications.  Physical Exam: General: somnolent but arousable- wanting to eat Heart: irreg, tachy Lungs: mostly clear Abdomen: obese, non tender Extremities: pitting edema    06/07/2017,9:21 AM  LOS: 1 day

## 2017-06-07 NOTE — Progress Notes (Signed)
[facility] for Heparin Indication: Atrial flutter  Allergies  Allergen Reactions  . Lisinopril Cough    Patient Measurements: Height: 6\' 1"  (185.4 cm) Weight: (!) 310 lb 13.6 oz (141 kg) IBW/kg (Calculated) : 79.9 Heparin Dosing Weight: 114kg  Vital Signs: BP: (P) 109/84 (01/22 1000) Pulse Rate: (P) 126 (01/22 1000)  Labs: Recent Labs    06/06/17 1042 06/06/17 2208 06/06/17 2219 06/07/17 0258 06/07/17 0705 06/07/17 1636  HGB 15.2  --   --  14.8  --   --   HCT 44.5  --   --  44.1  --   --   PLT 120*  --   --  105*  --   --   HEPARINUNFRC  --   --  0.18*  --  0.26* 0.17*  CREATININE 6.24*  --   --  6.90*  --   --   TROPONINI  --  0.24*  --  0.23*  --   --     Estimated Creatinine Clearance: 17 mL/min (A) (by C-G formula based on SCr of 6.9 mg/dL (H)).   Medical History: Past Medical History:  Diagnosis Date  . Abscess of groin   . Abscessed tooth   . Allergic rhinitis   . BPH (benign prostatic hypertrophy)    Massive BPH noted on cystoscopy 1/23/ 2012 by Dr. Risa Grill.  . Cardiomegaly    Mild by CXR 11/04/04  . Diabetes mellitus 04/08/2008  . Headache(784.0)   . Hyperlipemia   . Hypertension   . Hypertensive cardiopathy 03/01/2006   2-D echocardiogram 02/01/2012 showed moderate LVH, mildly to moderately reduced left ventricular systolic function with an estimated ejection fraction of 40-45%, and diffuse hypokinesis.  A nuclear medicine stress study done 01/31/2012 showed no reversible ischemia, a small mid anterior wall fixed defect/infarct, and ejection fraction 42%.      . Neck pain   . Obstructive sleep apnea 03/06/2008   Sleep study 03/06/08 showed severe OSA/hypopnea syndrome, with successful CPAP titration to 13 CWP using a medium ResMed Mirage Quattro full face mask with heated humidifier.   . Renal calculus 05/29/2010   CT scan of abdomen/pelvis on 05/29/2010 showed an obstructing approximate 1-2 mm calculus at the left  UVJ, and an approximate 1-2 mm left lower pole renal calculus.   Patient had continuing severe pain , and an elevation of his serum creatinine to a value of 1.75 on 06/06/2010.  The stone had apparently passed and was not seen on repeat CT 06/08/2010.   Assessment: 61yom to begin heparin for new aflutter. AKI noted. CBC wnl. No anticoagulants pta.   Follow up heparin level this afternoon is still is sub-therapeutic at 0.17. CBC stable overnight, no bleeding issues noted.  Spoke with RN, IV site has been occluding/beeping frequently.  Suspect drip rate still needs to increase some despite this.  Goal of Therapy:  Heparin level 0.3-0.7 units/ml Monitor platelets by anticoagulation protocol: Yes   Plan:  Increase heparin drip to 2300 units/hr Recheck heparin level in 8 hrs. Daily heparin level and CBC.  Uvaldo Rising, BCPS  Clinical Pharmacist Pager [phone]  06/07/2017 5:32 PM

## 2017-06-07 NOTE — H&P (View-Only) (Signed)
Cardiology Consultation:   Patient ID: Terry Parks; [id number]; [birth year]   Admit date: 06/06/2017 Date of Consult: 06/07/2017  Primary Care Provider: [name], MD Primary Cardiologist: [name], MD  Dr. Claiborne Billings for OSA   Patient Profile:   Terry Parks is a 62 y.o. male with a hx of HTN cardiomyopathy, combined systolic and diastolic heart failure, HTN, HLD, DM, BPH, and cardiomegaly who is being seen today for the evaluation of Afib at the request of Dr. Evette Doffing.  History of Present Illness:   Terry Parks is known to this service. He sees Dr. Sallyanne Kuster for general cardiology and Dr. Claiborne Billings for OSA. He last saw Dr. Sallyanne Kuster on 06/05/15. At that time, dry weight was noted to be 295-300 lbs. He was asked to increase his lasix to 80 mg daily. He has also been diagnosed with severe OSA with CPAP recommendations.   He presented to [facility] with complaints of 2-week history of increased DOE, bilateral leg swelling and increased weight of 20 lbs. He does not weigh regularly at home. He also reported nonproductive cough and congestion.  He was admitted to medicine service for IV diuresis. On my interview, he states he was compliant with his medications, including lasix, but suffered urinary retention. He is being worked up for bladder mass and enlarged prostate. Foley catheter was inserted with 2-3 L urine output.   Last evening at 2100, he was noted to be in Aflutter/fib with poor rate control. Telemetry review with fib/flutter with rates in the 150s.  Coreg was increased to 25 mg BID. HE received 2 injections of IV diltiazem 10-15 mg without resolution of RVR.  Primary team started amiodarone and avoided AV node blocking agents due to hypotension. Per Epic, hypotension was not recorded but monitor in room with systolic pressure 97 with HR 130.   He is largely unaware of his rhythm and does not know if he has a history of Afib. He denies dizziness and lightheadedness. He reports chest  pain that is associated with cough. Troponin are positive 0.24 --> 0.23. EKG with Afib, no acute signs of ischemia.    Past Medical History:  Diagnosis Date  . Abscess of groin   . Abscessed tooth   . Allergic rhinitis   . BPH (benign prostatic hypertrophy)    Massive BPH noted on cystoscopy 1/23/ 2012 by Dr. Risa Grill.  . Cardiomegaly    Mild by CXR 11/04/04  . Diabetes mellitus 04/08/2008  . Headache(784.0)   . Hyperlipemia   . Hypertension   . Hypertensive cardiopathy 03/01/2006   2-D echocardiogram 02/01/2012 showed moderate LVH, mildly to moderately reduced left ventricular systolic function with an estimated ejection fraction of 40-45%, and diffuse hypokinesis.  A nuclear medicine stress study done 01/31/2012 showed no reversible ischemia, a small mid anterior wall fixed defect/infarct, and ejection fraction 42%.      . Neck pain   . Obstructive sleep apnea 03/06/2008   Sleep study 03/06/08 showed severe OSA/hypopnea syndrome, with successful CPAP titration to 13 CWP using a medium ResMed Mirage Quattro full face mask with heated humidifier.   . Renal calculus 05/29/2010   CT scan of abdomen/pelvis on 05/29/2010 showed an obstructing approximate 1-2 mm calculus at the left UVJ, and an approximate 1-2 mm left lower pole renal calculus.   Patient had continuing severe pain , and an elevation of his serum creatinine to a value of 1.75 on 06/06/2010.  The stone had apparently passed and was not seen on repeat CT 06/08/2010.  Past Surgical History:  Procedure Laterality Date  . CYSTOSCOPY W/ RETROGRADES       Home Medications:  Prior to Admission medications   Medication Sig Start Date End Date Taking? Authorizing Provider  amLODipine-olmesartan (AZOR) 5-40 MG tablet Take 1 tablet by mouth daily. 05/30/17  Yes Rice, Resa Miner, MD  Armodafinil (NUVIGIL) 150 MG tablet Take 1 tablet (150 mg total) by mouth daily. As needed. Patient taking differently: Take 150 mg by mouth daily.  06/12/15   Yes Troy Sine, MD  aspirin 81 MG EC tablet Take 81 mg by mouth daily.    Yes [provider]  atorvastatin (LIPITOR) 40 MG tablet Take 1 tablet (40 mg total) by mouth daily. 12/29/16  Yes Aldine Contes, MD  carvedilol (COREG) 25 MG tablet Take 1 tablet (25 mg total) by mouth 2 (two) times daily. 05/20/17 06/19/17 Yes [name], Vahini, MD  DULoxetine (CYMBALTA) 60 MG capsule Take 1 capsule (60 mg total) by mouth daily. 05/30/17 05/30/18 Yes Rice, Resa Miner, MD  empagliflozin (JARDIANCE) 10 MG TABS tablet Take 10 mg by mouth daily. 10/07/15  Yes Riccardo Dubin, MD  furosemide (LASIX) 40 MG tablet Take 1 tablet (40 mg total) by mouth daily. Patient taking differently: Take 40 mg by mouth 2 (two) times daily.  10/21/16 10/21/17 Yes Troy Sine, MD  Insulin Glargine (LANTUS SOLOSTAR) 100 UNIT/ML Solostar Pen Inject 56 Units into the skin daily. Patient taking differently: Inject 60 Units into the skin daily.  05/20/17  Yes [name], Vahini, MD  Ipratropium-Albuterol (COMBIVENT) 20-100 MCG/ACT AERS respimat Inhale 1 puff into the lungs every 6 (six) hours as needed for wheezing or shortness of breath. 01/18/14  Yes Oval Linsey, MD  isosorbide mononitrate (IMDUR) 30 MG 24 hr tablet Take 1 tablet (30 mg total) by mouth daily. 11/02/16  Yes Riccardo Dubin, MD  metFORMIN (GLUCOPHAGE) 1000 MG tablet Take 1 tablet (1,000 mg total) by mouth 2 (two) times daily with a meal. 07/20/16  Yes Dellia Nims, MD  Misc. Devices MISC by Does not apply route. C-PAP   Yes [provider]  Cholecalciferol 2000 units TABS Take 1 tablet (2,000 Units total) by mouth daily. Patient not taking: Reported on 06/06/2017 11/06/16   Riccardo Dubin, MD  glucose blood (TRUETRACK TEST) test strip Use as directed to test blood sugar three times a day before meals. 10/04/12   Bertha Stakes, MD  Insulin Pen Needle (CARETOUCH PEN NEEDLES) 31G X 6 MM MISC 1 pen by Does not apply route at bedtime. 03/01/17   [name], MD   Lancets MISC Use to test blood sugar three times a day before meals. 09/14/13   Malena Catholic, MD    Inpatient Medications: Scheduled Meds: . DULoxetine  60 mg Oral Daily  . insulin aspart  0-9 Units Subcutaneous TID WC  . insulin glargine  40 Units Subcutaneous QHS  . pantoprazole  40 mg Oral Daily  . polyethylene glycol  17 g Oral Once   Continuous Infusions: . amiodarone    . amiodarone    . amiodarone    . heparin 2,000 Units/hr (06/07/17 0851)   PRN Meds: acetaminophen **OR** acetaminophen, gi cocktail, ondansetron, phenol, senna-docusate  Allergies:    Allergies  Allergen Reactions  . Lisinopril Cough    Social History:   Social History   Socioeconomic History  . Marital status: Single    Spouse name: Not on file  . Number of children: 4 b  .  Years of education: 9  . Highest education level: Not on file  Social Needs  . Financial resource strain: Not on file  . Food insecurity - worry: Not on file  . Food insecurity - inability: Not on file  . Transportation needs - medical: Not on file  . Transportation needs - non-medical: Not on file  Occupational History  . Occupation:        Employer: UNEMPLOYED  Tobacco Use  . Smoking status: Never Smoker  . Smokeless tobacco: Never Used  Substance and Sexual Activity  . Alcohol use: No    Alcohol/week: 0.0 oz  . Drug use: No  . Sexual activity: Not Currently  Other Topics Concern  . Not on file  Social History Narrative   Divorced, 4 children, lives alone.  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    Family History:    Family History  Problem Relation Age of Onset  . Breast cancer Mother   . Hypertension Father   . Diabetes Maternal Grandmother   . Colon cancer Neg Hx   . Prostate cancer Neg Hx   . Heart attack Neg Hx      ROS:  Please see the history of present illness.  ROS  All other ROS reviewed and negative.     Physical Exam/Data:   Vitals:   06/07/17 0021 06/07/17 0508 06/07/17  0710 06/07/17 1000  BP:  (!) 124/105 131/80 (P) 109/84  Pulse:  (!) 151 (!) 142 (!) (P) 126  Resp: (!) 22 (!) 23    Temp:  98.2 F (36.8 C)    TempSrc:  Axillary    SpO2:  95%    Weight:  (!) 310 lb 13.6 oz (141 kg)    Height:        Intake/Output Summary (Last 24 hours) at 06/07/2017 1204 Last data filed at 06/07/2017 1740 Gross per 24 hour  Intake 164.77 ml  Output 3450 ml  Net -3285.23 ml   Filed Weights   06/06/17 1700 06/06/17 2113 06/07/17 0508  Weight: (!) 325 lb 9.9 oz (147.7 kg) 288 lb (130.6 kg) (!) 310 lb 13.6 oz (141 kg)   Body mass index is 41.01 kg/m.  General:  Well nourished, well developed, in no acute distress HEENT: normal Neck: + JVD Vascular: No carotid bruits Cardiac:  Irregular rhythm, irregular rate, no mumur Lungs:  Cough, respirations unlabored but coarse sounds throughout  Abd: soft, nontender, no hepatomegaly  Ext: trace to 1+ edema Musculoskeletal:  No deformities, BUE and BLE strength normal and equal Skin: warm and dry  Neuro:  CNs 2-12 intact, no focal abnormalities noted Psych:  Normal affect   EKG:  The EKG was personally reviewed and demonstrates:  Afib Telemetry:  Telemetry was personally reviewed and demonstrates:  Afib 90-150s  Relevant CV Studies:  Echo pending  Echo 06/16/15: Study Conclusions - Procedure narrative: Transthoracic echocardiography. Image  quality was fair. Intravenous contrast (Definity) was   administered. - Left ventricle: The cavity size was normal. Wall thickness was   increased in a pattern of mild LVH. Systolic function was mildly   to moderately reduced. The estimated ejection fraction was in the   range of 40% to 45%. Diffuse hypokinesis. Doppler parameters are   consistent with abnormal left ventricular relaxation (grade 1   diastolic dysfunction). The E/e ratio is between 8-15, suggesting   indeterminate LV filling pressure. - Left atrium: The atrium was normal in size. - Inferior vena cava: The vessel  was normal in  size. The   respirophasic diameter changes were in the normal range (= 50%),   consistent with normal central venous pressure.  Impressions: - Compared to a prior study in 09/2014, the LVEF has declined to 40-45% (the LVEF in 2013 was around this value).   Myoview 11/28/14:  The left ventricular ejection fraction is severely decreased (<30%).  Nuclear stress EF: 25%.   Diaphragmatic attenuation with mild LV dilatation and severeal global HK C/W NISCM  Laboratory Data:  Chemistry Recent Labs  Lab 06/06/17 1042 06/07/17 0258  NA 132* 130*  K 4.9 5.0  CL 94* 94*  CO2 21* 19*  GLUCOSE 227* 208*  BUN 42* 56*  CREATININE 6.24* 6.90*  CALCIUM 8.7* 8.5*  GFRNONAA 9* 8*  GFRAA 10* 9*  ANIONGAP 17* 17*    Recent Labs  Lab 06/06/17 1042 06/07/17 0258  PROT 6.5 6.4*  ALBUMIN 3.1* 2.8*  AST 29 24  ALT 16* 18  ALKPHOS 67 69  BILITOT 1.8* 1.4*   Hematology Recent Labs  Lab 06/06/17 1042 06/07/17 0258  WBC 23.3* 22.3*  RBC 5.55 5.49  HGB 15.2 14.8  HCT 44.5 44.1  MCV 80.2 80.3  MCH 27.4 27.0  MCHC 34.2 33.6  RDW 14.8 14.7  PLT 120* 105*   Cardiac Enzymes Recent Labs  Lab 06/06/17 2208 06/07/17 0258  TROPONINI 0.24* 0.23*    Recent Labs  Lab 06/06/17 1053  TROPIPOC 0.13*    BNP Recent Labs  Lab 06/06/17 1042  BNP 991.6*    DDimer No results for input(s): DDIMER in the last 168 hours.  Radiology/Studies:  Dg Chest 2 View  Result Date: 06/06/2017 CLINICAL DATA:  Chest pain for 4 days. EXAM: CHEST  2 VIEW COMPARISON:  July 12, 2014 FINDINGS: The mediastinal contour is normal. The heart size is enlarged. There is mild central pulmonary vascular congestion. There is no focal pneumonia or pleural effusion. The visualized skeletal structures are unremarkable. IMPRESSION: Cardiomegaly. Mild central pulmonary vascular congestion. No frank pulmonary edema. Electronically Signed   By: Abelardo Diesel M.D.   On: 06/06/2017 10:11   US  Renal  Result Date: 06/06/2017 CLINICAL DATA:  Acute kidney injury EXAM: RENAL / URINARY TRACT ULTRASOUND COMPLETE COMPARISON:  CT 06/07/2010 FINDINGS: Right Kidney: Length: 13 cm. Cortical echogenicity is within normal limits. Mild right hydronephrosis and enlarged extrarenal pelvis. Left Kidney: Length: 12.6 cm.  Poorly visualized.  No definite hydronephrosis. Bladder: Bladder is distended, prevoid volume of 1334 cubic cm. Mildly echogenic mass within the bladder measuring 3.7 x 3.2 x 4.6 cm. IMPRESSION: 1. Mild right hydronephrosis 2. Poorly visible left kidney, but without definitive hydronephrosis 3. Distended urinary bladder. 4.6 cm echogenic mass in the bladder, could represent focal hematoma or hypovascular soft tissue mass. Correlation with direct visualization may be considered. Electronically Signed   By: Donavan Foil M.D.   On: 06/06/2017 20:34    Assessment and Plan:   1. Atrial fibrillation - amiodarone has been ordered, does not appear he has received amiodarone - agree with amiodarone bolus and drip - coreg has increased to 25 mg BID - diltiazem IV x 2 given without improvement in his RVR - not a great medication given his heart failure - would recommend starting lopressor 25 mg BID - last pressure in the 130s - agree with heparin drip for now if there are planned procedures - this is likely secondary to acute illness and acute on chronic combined CHF  - This patients CHA2DS2-VASc Score and unadjusted  Ischemic Stroke Rate (% per year) is equal to 3.2 % stroke rate/year from a score of 3 (CHF, HTN, DM) - monitor how pt responds to amiodarone - will tentatively plan for DCCV tomorrow  - NPO at MN   2. Acute on chronic systolic and diastolic heart failure - home medications include norvasc-olmesartan, coreg, lasix 40 mg BID, imdur - foley in place for urinary retention - he is overall net negative 3L with 2.2 L urine output yesterday - weight is 325 lbs, now 310 lbs - dry  weight is 295-300 lbs - he has received 40 mg IV lasix x 2 - agree with 40 mg IV lasix BID - improving volume status will likely improve his respiratory function and Afib RVR   3. HTN - hold norvasc, ARB, and imdur during IV diuresis and titrating meds for RVR   4. DM - per primary team   5. HLD - continue lipitor   For questions or updates, please contact [place] Please consult [email or web address] for contact info under Cardiology/STEMI.   Signed, Terry Bottcher, PA  06/07/2017 12:04 PM   History and all data above reviewed.  Patient examined.  I agree with the findings as above.  The patient presents with progressive dyspnea.  He says that he has not been feeling well for a couple of weeks.  I suspect that it might be longer.  He has had family members buying his groceries.  However, he has not felt like eating and has had vomiting.  He came in when he started feeling like he could not get a deep breath and had some soreness with breathing.  He had severe urinary retention and acute kidney failure as noted above.  He has had no palpitations.  However, last night he went into atrial fib with rapid rate.  He does not notice this.  He does have a history of sleep apnea and a reduced EF.  He has not been seen in our office in a couple of years.  The patient exam reveals XNT:[id number]  ,  Lungs: Decreased breath sounds at the bases.  ,  Abd: Positive bowel sounds, no rebound no guarding, Ext Mild edema  .  All available labs, radiology testing, previous records reviewed. Agree with documented assessment and plan. Atrial fib:  New onset atrial fib with RVR.  Start heparin.  I will suggest amiodarone IV as he has very rapid rate.  If he does not convert he will need DCCV in the morning.  Echo is pending.  Unable to use afterload reduction at this point with ARB or ACE inhibitor secondary to AKI.  OK to continue beta blocker although, per renal, we do not want to drop the BP excessively.     Terry Parks  3:14 PM  06/07/2017

## 2017-06-07 NOTE — Progress Notes (Signed)
MD on call notified for retention with foley cath. Bladder scan revealed 999 mL. Unable to advance or inflate balloon completely. MD order to insert Coude cath.

## 2017-06-07 NOTE — Progress Notes (Signed)
MD on call notified of HR of 151,after 10 mg of Cardizem, BP 124/105.

## 2017-06-08 ENCOUNTER — Inpatient Hospital Stay (HOSPITAL_COMMUNITY): Payer: Self-pay | Admitting: Certified Registered Nurse Anesthetist

## 2017-06-08 ENCOUNTER — Other Ambulatory Visit: Payer: Self-pay

## 2017-06-08 ENCOUNTER — Encounter (HOSPITAL_COMMUNITY)
Admission: EM | Disposition: A | Discharge status: Home / Self Care | Payer: Self-pay | Source: Home / Self Care | Attending: Student in an Organized Health Care Education/Training Program

## 2017-06-08 ENCOUNTER — Encounter (HOSPITAL_COMMUNITY): Payer: Self-pay | Admitting: Certified Registered Nurse Anesthetist

## 2017-06-08 DIAGNOSIS — I4811 Longstanding persistent atrial fibrillation: Secondary | ICD-10-CM

## 2017-06-08 DIAGNOSIS — N3289 Other specified disorders of bladder: Secondary | ICD-10-CM

## 2017-06-08 DIAGNOSIS — I481 Persistent atrial fibrillation: Secondary | ICD-10-CM

## 2017-06-08 DIAGNOSIS — I4819 Other persistent atrial fibrillation: Secondary | ICD-10-CM

## 2017-06-08 HISTORY — PX: CARDIOVERSION: SHX1299

## 2017-06-08 LAB — RENAL FUNCTION PANEL
ANION GAP: 12 (ref 5–15)
Albumin: 2.4 g/dL — ABNORMAL LOW (ref 3.5–5.0)
BUN: 44 mg/dL — ABNORMAL HIGH (ref 6–20)
CALCIUM: 8.2 mg/dL — AB (ref 8.9–10.3)
CHLORIDE: 99 mmol/L — AB (ref 101–111)
CO2: 23 mmol/L (ref 22–32)
Creatinine, Ser: 2.92 mg/dL — ABNORMAL HIGH (ref 0.61–1.24)
GFR calc non Af Amer: 22 mL/min — ABNORMAL LOW (ref >60)
GFR, EST AFRICAN AMERICAN: 26 mL/min — AB (ref >60)
Glucose, Bld: 132 mg/dL — ABNORMAL HIGH (ref 65–99)
Phosphorus: 3.7 mg/dL (ref 2.5–4.6)
Potassium: 4.2 mmol/L (ref 3.5–5.1)
Sodium: 134 mmol/L — ABNORMAL LOW (ref 135–145)

## 2017-06-08 LAB — CBC
HEMATOCRIT: 39.7 % (ref 39.0–52.0)
Hemoglobin: 13 g/dL (ref 13.0–17.0)
MCH: 26.4 pg (ref 26.0–34.0)
MCHC: 32.7 g/dL (ref 30.0–36.0)
MCV: 80.7 fL (ref 78.0–100.0)
Platelets: 119 10*3/uL — ABNORMAL LOW (ref 150–400)
RBC: 4.92 MIL/uL (ref 4.22–5.81)
RDW: 14.6 % (ref 11.5–15.5)
WBC: 14.9 10*3/uL — ABNORMAL HIGH (ref 4.0–10.5)

## 2017-06-08 LAB — GLUCOSE, CAPILLARY
GLUCOSE-CAPILLARY: 80 mg/dL (ref 65–99)
GLUCOSE-CAPILLARY: 124 mg/dL — AB (ref 65–99)
Glucose-Capillary: 130 mg/dL — ABNORMAL HIGH (ref 65–99)
Glucose-Capillary: 363 mg/dL — ABNORMAL HIGH (ref 65–99)
Glucose-Capillary: 92 mg/dL (ref 65–99)

## 2017-06-08 LAB — HEPARIN LEVEL (UNFRACTIONATED)
HEPARIN UNFRACTIONATED: 0.78 [IU]/mL — AB (ref 0.30–0.70)
Heparin Unfractionated: 0.78 IU/mL — ABNORMAL HIGH (ref 0.30–0.70)

## 2017-06-08 LAB — MAGNESIUM: Magnesium: 2 mg/dL (ref 1.7–2.4)

## 2017-06-08 LAB — PROTIME-INR
INR: 1.27
Prothrombin Time: 15.8 seconds — ABNORMAL HIGH (ref 11.4–15.2)

## 2017-06-08 SURGERY — CARDIOVERSION
Anesthesia: General

## 2017-06-08 MED ORDER — WARFARIN - PHARMACIST DOSING INPATIENT: Freq: Every day | Status: DC

## 2017-06-08 MED ORDER — PHENYLEPHRINE 8 MG IN D5W 100 ML (0.08MG/ML) PREMIX OPTIME: 30.0000 ug/min | INJECTION | INTRAVENOUS | Status: DC

## 2017-06-08 MED ORDER — POLYETHYLENE GLYCOL 3350 17 G PO PACK
17.0000 g | PACK | Freq: Two times a day (BID) | ORAL | Status: DC
  Administered 2017-06-08 – 2017-06-09 (×3): 17 g via ORAL
  Filled 2017-06-08 (×3): qty 1

## 2017-06-08 MED ORDER — PHENYLEPHRINE HCL 10 MG/ML IJ SOLN
INTRAVENOUS | Status: DC | PRN
  Administered 2017-06-08: 100 ug/min via INTRAVENOUS

## 2017-06-08 MED ORDER — PHENYLEPHRINE 40 MCG/ML (10ML) SYRINGE FOR IV PUSH (FOR BLOOD PRESSURE SUPPORT)
PREFILLED_SYRINGE | INTRAVENOUS | Status: DC | PRN
  Administered 2017-06-08 (×5): 80 ug via INTRAVENOUS

## 2017-06-08 MED ORDER — HEPARIN (PORCINE) IN NACL 100-0.45 UNIT/ML-% IJ SOLN
1850.0000 [IU]/h | INTRAMUSCULAR | Status: DC
  Administered 2017-06-08: 2000 [IU]/h via INTRAVENOUS
  Filled 2017-06-08: qty 250

## 2017-06-08 MED ORDER — SENNOSIDES-DOCUSATE SODIUM 8.6-50 MG PO TABS
2.0000 | ORAL_TABLET | Freq: Two times a day (BID) | ORAL | Status: DC
  Administered 2017-06-08 – 2017-06-09 (×3): 2 via ORAL
  Filled 2017-06-08 (×3): qty 2

## 2017-06-08 MED ORDER — WARFARIN SODIUM 10 MG PO TABS
10.0000 mg | ORAL_TABLET | Freq: Once | ORAL | Status: AC
  Administered 2017-06-08: 10 mg via ORAL
  Filled 2017-06-08: qty 1

## 2017-06-08 MED ORDER — PROPOFOL 10 MG/ML IV BOLUS
INTRAVENOUS | Status: DC | PRN
  Administered 2017-06-08: 60 mg via INTRAVENOUS

## 2017-06-08 NOTE — Progress Notes (Signed)
[place] for Heparin and Warfarin  Indication: Atrial flutter/Atrial fib  Allergies  Allergen Reactions  . Lisinopril Cough    Patient Measurements: Height: 6\' 1"  (185.4 cm) Weight: (!) 307 lb 12.2 oz (139.6 kg) IBW/kg (Calculated) : 79.9 Heparin Dosing Weight: 114kg  Vital Signs: Temp: 98 F (36.7 C) (01/23 0950) Temp Source: Oral (01/23 0950) BP: 119/82 (01/23 1632) Pulse Rate: 95 (01/23 1632)  Labs: Recent Labs    06/06/17 1042 06/06/17 2208  06/07/17 0258  06/07/17 1636 06/08/17 0416 06/08/17 1601  HGB 15.2  --   --  14.8  --   --  13.0  --   HCT 44.5  --   --  44.1  --   --  39.7  --   PLT 120*  --   --  105*  --   --  119*  --   LABPROT  --   --   --   --   --   --   --  15.8*  INR  --   --   --   --   --   --   --  1.27  HEPARINUNFRC  --   --    < >  --    < > 0.17* 0.78* 0.78*  CREATININE 6.24*  --   --  6.90*  --  4.31* 2.92*  --   TROPONINI  --  0.24*  --  0.23*  --   --   --   --    < > = values in this interval not displayed.    Estimated Creatinine Clearance: 40 mL/min (A) (by C-G formula based on SCr of 2.92 mg/dL (H)).  Assessment: 62 y.o. male with Afib for heparin and warfarin. Heparin level remains 0.78, still slightly above goal after heparin rate decreased this morning to 2150 units/hr. Pharmacy consulted to start Warfarin today.  Baseline INR 1.27. No bleeding noted   Goal of Therapy:  Heparin level 0.3-0.7 units/ml Monitor platelets by anticoagulation protocol: Yes   Plan:  Decrease Heparin drip to 2000 units/hr Check 6 hour heparin level Give coumadin 10 mg po tonight x1 Daily heparin level, CBC and INR   Nicole Cella, RPh Clinical Pharmacist Pager: [phone] 4P-10P [place] [phone]  06/08/2017 5:12 PM

## 2017-06-08 NOTE — Transfer of Care (Signed)
Immediate Anesthesia Transfer of Care Note  Patient: Terry Parks  Procedure(s) Performed: CARDIOVERSION (N/A )  Patient Location: PACU and Endoscopy Unit  Anesthesia Type:MAC  Level of Consciousness: awake, alert  and oriented  Airway & Oxygen Therapy: Patient Spontanous Breathing and Patient connected to nasal cannula oxygen  Post-op Assessment: Report given to RN and Post -op Vital signs reviewed and stable  Post vital signs: Reviewed and stable  Last Vitals:  Vitals:   06/08/17 0327 06/08/17 0850  BP: 118/81 (!) 134/96  Pulse: 65 78  Resp: 13 (!) 25  Temp: 36.7 C 36.6 C  SpO2: 91% 99%    Last Pain:  Vitals:   06/08/17 0850  TempSrc: Oral  PainSc: 4          Complications: No apparent anesthesia complications

## 2017-06-08 NOTE — Progress Notes (Signed)
Phenylephrine drip stopped at 0948 per Dr. Johnsie Cancel.

## 2017-06-08 NOTE — CV Procedure (Signed)
DCC:   Anesthesia Dr Nyoka Cowden 60 mg propofol Rhythm atrial fib/flutter rateh 122 Less than 48 hours on amiodarone and heparin  DCC x 1 200J biphasic synch  Converted to NSR rate 88  No immediate neurologic sequelae   Will likely need transition to coumadin  Do not stop heparin post [facility] !!  Jenkins Rouge

## 2017-06-08 NOTE — Progress Notes (Addendum)
   Subjective: Mr. Redder was seen resting in his bed this morning stating that he is continuing to have chest pain and some gi discomfort from indigestion.   Objective:  Vital signs in last 24 hours: Vitals:   06/07/17 1732 06/07/17 2141 06/07/17 2358 06/08/17 0327  BP: 111/78 (!) 106/92 108/85 118/81  Pulse: 85 (!) 102 (!) 48 65  Resp: (!) 21 (!) 27 (!) 24 13  Temp:  97.6 F (36.4 C) (!) 96.8 F (36 C) 98 F (36.7 C)  TempSrc:  Oral Axillary Axillary  SpO2:  95% 99% 91%  Weight:    (!) 307 lb 12.2 oz (139.6 kg)  Height:       Physical Exam  Constitutional: He has a sickly appearance. He appears ill. He appears distressed.  HENT:  Head: Normocephalic and atraumatic.  Eyes: Conjunctivae are normal.  Cardiovascular: Normal rate, normal heart sounds and intact distal pulses. An irregularly irregular rhythm present.  Respiratory: Effort normal and breath sounds normal. No respiratory distress. He has no wheezes.  GI: Soft. Bowel sounds are normal. He exhibits distension. There is no tenderness.  Musculoskeletal: He exhibits no edema (no pitting edema, but enlarged lower extremities).  Neurological: He is alert.  Skin: He is not diaphoretic. No erythema.  Psychiatric: He has a normal mood and affect. His behavior is normal. Judgment and thought content normal.    Assessment/Plan:  Acute on chronic combinedsystolic and diastolic heart failure Patient has net -4.6L over the past 24 hrs and is 307lbs from prior 310lbs yesterday. The patient's dry weight is approximately 290lbs.   -IV lasix 40mg  bid stopped will monitor with autodiuresis -continue to Monitor I/O -TSH=0.236  Atrial Fibrillation CHA2DS2-VASc=3. TEE DCCV today 06/08/17.  -continue heparin -continue amiodarone -metoprolol 25mg  qd  Anion Gap Metabolic Acidosis Resolved  Acute renal failure secondary to post renal obstruction Presented with a creatinine of 6.2 which he usually has baseline of 1-1.1. The  patient has auto-diuresed out a good amount of urine over the past 24 hrs and his creatinine is trending down with cr=2.92 today 06/08/17.  The patient should remain with coude cath for 4-6 weeks due to bladder distention and needing time for the bladder to rest. Follow up with outpatient urology for cath removal and f/u regarding bladder mass found on imaging.   -continuing bmp daily  Diabetes mellitus type 2 The patient's blood glucose has been ranging 124-132. HgA1c=8.1  -continue Lantus 40units  -Added SSI  Constipation/ GI indigestion  -GI cocktail -continue miralax -continue senna  Dispo: Anticipated discharge in approximately 2-3 day(s).   [name], MD Internal Medicine PGY1 Pager:[phone] 06/08/2017, 8:06 AM

## 2017-06-08 NOTE — CV Procedure (Signed)
Post DCC no focal neurologic signs BP low  Patient required neo synephrine for short while via PIC He was lethargic before [facility] and continues this but No headache and moves all extremities Did not want to bolus with fluid due to CHF After 20 minutes doing better systolic Bp 563 mmHg  No symptoms will wean neo and if BP adequate Can go back to 4E if neo needs to be restarted will need unit bed  [employer]

## 2017-06-08 NOTE — Anesthesia Postprocedure Evaluation (Signed)
Anesthesia Post Note  Patient: Terry Parks  Procedure(s) Performed: CARDIOVERSION (N/A )     Anesthesia Post Evaluation  Last Vitals:  Vitals:   06/08/17 1020 06/08/17 1025  BP: 114/75 113/79  Pulse: 85 88  Resp: 11 16  Temp:    SpO2: 100% 100%    Last Pain:  Vitals:   06/08/17 1005  TempSrc:   PainSc: 3                  Talynn Lebon

## 2017-06-08 NOTE — Anesthesia Postprocedure Evaluation (Signed)
Anesthesia Post Note  Patient: Terry Parks  Procedure(s) Performed: CARDIOVERSION (N/A )     Patient location during evaluation: Endoscopy Anesthesia Type: General Level of consciousness: awake Pain management: pain level controlled Respiratory status: spontaneous breathing Cardiovascular status: stable Anesthetic complications: no    Last Vitals:  Vitals:   06/08/17 1020 06/08/17 1025  BP: 114/75 113/79  Pulse: 85 88  Resp: 11 16  Temp:    SpO2: 100% 100%    Last Pain:  Vitals:   06/08/17 1005  TempSrc:   PainSc: 3                  Katasha Riga

## 2017-06-08 NOTE — Progress Notes (Signed)
Subjective:  Events noted- s/p  cardioversion today- cont to make good urine (5 liters yest)  and creatinine down  Objective Vital signs in last 24 hours: Vitals:   06/08/17 1010 06/08/17 1015 06/08/17 1020 06/08/17 1025  BP: 111/78 113/75 114/75 113/79  Pulse:   85 88  Resp: 20 13 11 16   Temp:      TempSrc:      SpO2:   100% 100%  Weight:      Height:       Weight change: -8.1 kg (-13.7 oz)  Intake/Output Summary (Last 24 hours) at 06/08/2017 0844 Last data filed at 06/08/2017 0500 Gross per 24 hour  Intake 432.11 ml  Output 3850 ml  Net -3417.89 ml    Assessment/Plan: 62 year old black male with acute kidney injury onset the last 8 days in the setting of Lasix 40 mg twice daily and he says no other medications.  Also urinary obstructive symptoms 1.Renal-creatinine increased dramiatically in 7 days.  Was being diuresed but sounds like it was appropriate.  He denied any other nephrotoxins.    He refused Foley catheter initially but u/s showed hydro- after foley cath- had 2 liters, followed by another liter of UOP.  Since relief of abstruction has had great UOP and kidney function improving.- have stopped lasix because is autodiuresing- be cautious with meds for A fib.   2. Hypertension/volume  -BP soft.  He is volume overloaded but making great urine.  He has not been taking his combination blood pressure medication with an ARB-would not challenge with an ACE or ARB at this time. Gentle meds to help with afib-  now s/p cardioversion 3. Anemia  -not an issue at this time 5. Hydro and bladder mass- will need to get urology input   Because renal function is trending better with obstruction relief and obs was short lived, siuspect renal function will cont to improve.  Renal will sign off, need urology to determine course for bladder mass and continued foley need      Terry Parks A    Labs: Basic Metabolic Panel: Recent Labs  Lab 06/07/17 0258 06/07/17 1636 06/08/17 0416   NA 130* 135 134*  K 5.0 4.3 4.2  CL 94* 100* 99*  CO2 19* 22 23  GLUCOSE 208* 131* 132*  BUN 56* 53* 44*  CREATININE 6.90* 4.31* 2.92*  CALCIUM 8.5* 8.2* 8.2*  PHOS  --  4.9* 3.7   Liver Function Tests: Recent Labs  Lab 06/06/17 1042 06/07/17 0258 06/07/17 1636 06/08/17 0416  AST 29 24  --   --   ALT 16* 18  --   --   ALKPHOS 67 69  --   --   BILITOT 1.8* 1.4*  --   --   PROT 6.5 6.4*  --   --   ALBUMIN 3.1* 2.8* 2.3* 2.4*   Recent Labs  Lab 06/06/17 1042  LIPASE 19   No results for input(s): AMMONIA in the last 168 hours. CBC: Recent Labs  Lab 06/06/17 1042 06/07/17 0258 06/08/17 0416  WBC 23.3* 22.3* 14.9*  NEUTROABS  --  17.6  --   HGB 15.2 14.8 13.0  HCT 44.5 44.1 39.7  MCV 80.2 80.3 80.7  PLT 120* 105* 119*   Cardiac Enzymes: Recent Labs  Lab 06/06/17 2208 06/07/17 0258  TROPONINI 0.24* 0.23*   CBG: Recent Labs  Lab 06/06/17 2131 06/07/17 0225 06/07/17 0601 06/07/17 0720 06/07/17 1102  GLUCAP 185* 221* 188* 174* 158*  Iron Studies: No results for input(s): IRON, TIBC, TRANSFERRIN, FERRITIN in the last 72 hours. Studies/Results: Dg Chest 2 View  Result Date: 06/06/2017 CLINICAL DATA:  Chest pain for 4 days. EXAM: CHEST  2 VIEW COMPARISON:  July 12, 2014 FINDINGS: The mediastinal contour is normal. The heart size is enlarged. There is mild central pulmonary vascular congestion. There is no focal pneumonia or pleural effusion. The visualized skeletal structures are unremarkable. IMPRESSION: Cardiomegaly. Mild central pulmonary vascular congestion. No frank pulmonary edema. Electronically Signed   By: Abelardo Diesel M.D.   On: 06/06/2017 10:11   US Renal  Result Date: 06/06/2017 CLINICAL DATA:  Acute kidney injury EXAM: RENAL / URINARY TRACT ULTRASOUND COMPLETE COMPARISON:  CT 06/07/2010 FINDINGS: Right Kidney: Length: 13 cm. Cortical echogenicity is within normal limits. Mild right hydronephrosis and enlarged extrarenal pelvis. Left  Kidney: Length: 12.6 cm.  Poorly visualized.  No definite hydronephrosis. Bladder: Bladder is distended, prevoid volume of 1334 cubic cm. Mildly echogenic mass within the bladder measuring 3.7 x 3.2 x 4.6 cm. IMPRESSION: 1. Mild right hydronephrosis 2. Poorly visible left kidney, but without definitive hydronephrosis 3. Distended urinary bladder. 4.6 cm echogenic mass in the bladder, could represent focal hematoma or hypovascular soft tissue mass. Correlation with direct visualization may be considered. Electronically Signed   By: Donavan Foil M.D.   On: 06/06/2017 20:34   Medications: Infusions: . sodium chloride    . amiodarone 30 mg/hr (06/08/17 0833)  . heparin 2,150 Units/hr (06/08/17 6789)    Scheduled Medications: . DULoxetine  60 mg Oral Daily  . insulin aspart  0-9 Units Subcutaneous TID WC  . insulin glargine  40 Units Subcutaneous QHS  . pantoprazole  40 mg Oral Daily  . polyethylene glycol  17 g Oral Once  . sodium chloride flush  10-40 mL Intracatheter Q12H  . sodium chloride flush  3 mL Intravenous Q12H    have reviewed scheduled and prn medications.  Physical Exam: General: more alert  Heart: irreg, tachy Lungs: mostly clear Abdomen: obese, non tender Extremities: pitting edema but much improved     06/08/2017,8:44 AM  LOS: 2 days

## 2017-06-08 NOTE — Progress Notes (Signed)
[place] for Heparin Indication: Atrial flutter  Allergies  Allergen Reactions  . Lisinopril Cough    Patient Measurements: Height: 6\' 1"  (185.4 cm) Weight: (!) 307 lb 12.2 oz (139.6 kg) IBW/kg (Calculated) : 79.9 Heparin Dosing Weight: 114kg  Vital Signs: Temp: 98 F (36.7 C) (01/23 0327) Temp Source: Axillary (01/23 0327) BP: 118/81 (01/23 0327) Pulse Rate: 65 (01/23 0327)  Labs: Recent Labs    06/06/17 1042 06/06/17 2208  06/07/17 0258 06/07/17 0705 06/07/17 1636 06/08/17 0416  HGB 15.2  --   --  14.8  --   --  13.0  HCT 44.5  --   --  44.1  --   --  39.7  PLT 120*  --   --  105*  --   --  119*  HEPARINUNFRC  --   --    < >  --  0.26* 0.17* 0.78*  CREATININE 6.24*  --   --  6.90*  --  4.31* 2.92*  TROPONINI  --  0.24*  --  0.23*  --   --   --    < > = values in this interval not displayed.    Estimated Creatinine Clearance: 40 mL/min (A) (by C-G formula based on SCr of 2.92 mg/dL (H)).  Assessment: 62 y.o. male with Afib for heparin  Goal of Therapy:  Heparin level 0.3-0.7 units/ml Monitor platelets by anticoagulation protocol: Yes   Plan:  Decrease Heparin 2150 units/hr  Phillis Knack, PharmD, BCPS   06/08/2017 6:30 AM

## 2017-06-08 NOTE — Interval H&P Note (Signed)
History and Physical Interval Note:  06/08/2017 7:52 AM  Terry Parks  has presented today for surgery, with the diagnosis of AFIB  The various methods of treatment have been discussed with the patient and family. After consideration of risks, benefits and other options for treatment, the patient has consented to  Procedure(s): CARDIOVERSION (N/A) as a surgical intervention .  The patient's history has been reviewed, patient examined, no change in status, stable for surgery.  I have reviewed the patient's chart and labs.  Questions were answered to the patient's satisfaction.     Jenkins Rouge

## 2017-06-08 NOTE — Progress Notes (Signed)
Ok to return to 4E per Dr. Johnsie Cancel.

## 2017-06-08 NOTE — Progress Notes (Signed)
Progress Note  Patient Name: Terry Parks Date of Encounter: 06/08/2017  Primary Cardiologist:   [name], MD   Subjective   Wide awake.  No pain.  Unable to eat "because it won't go down".  No SOB.  Feels better with fluid loss  Inpatient Medications    Scheduled Meds: . [MAR Hold] DULoxetine  60 mg Oral Daily  . [MAR Hold] insulin aspart  0-9 Units Subcutaneous TID WC  . [MAR Hold] insulin glargine  40 Units Subcutaneous QHS  . [MAR Hold] pantoprazole  40 mg Oral Daily  . [MAR Hold] polyethylene glycol  17 g Oral Once  . [MAR Hold] sodium chloride flush  10-40 mL Intracatheter Q12H  . [MAR Hold] sodium chloride flush  3 mL Intravenous Q12H   Continuous Infusions: . sodium chloride    . amiodarone 30 mg/hr (06/08/17 0833)  . heparin 2,150 Units/hr (06/08/17 1610)   PRN Meds: [RUE Hold] acetaminophen **OR** [MAR Hold] acetaminophen, [MAR Hold] gi cocktail, [MAR Hold] ondansetron, [MAR Hold] phenol, [MAR Hold] polyethylene glycol, [MAR Hold] senna-docusate, [MAR Hold] sodium chloride flush, [MAR Hold] sodium chloride flush   Vital Signs    Vitals:   06/07/17 1732 06/07/17 2141 06/07/17 2358 06/08/17 0327  BP: 111/78 (!) 106/92 108/85 118/81  Pulse: 85 (!) 102 (!) 48 65  Resp: (!) 21 (!) 27 (!) 24 13  Temp:  97.6 F (36.4 C) (!) 96.8 F (36 C) 98 F (36.7 C)  TempSrc:  Oral Axillary Axillary  SpO2:  95% 99% 91%  Weight:    (!) 307 lb 12.2 oz (139.6 kg)  Height:        Intake/Output Summary (Last 24 hours) at 06/08/2017 0850 Last data filed at 06/08/2017 0500 Gross per 24 hour  Intake 432.11 ml  Output 3850 ml  Net -3417.89 ml   Filed Weights   06/06/17 2113 06/07/17 0508 06/08/17 0327  Weight: 288 lb (130.6 kg) (!) 310 lb 13.6 oz (141 kg) (!) 307 lb 12.2 oz (139.6 kg)    Telemetry    NSR - Personally Reviewed  ECG    NA - Personally Reviewed  Physical Exam   GEN: No acute distress.   Neck: No  JVD Cardiac: RRR, no murmurs, rubs, or  gallops.  Respiratory: Clear  to auscultation bilaterally. GI: Soft, nontender, non-distended  MS: Mild edema; No deformity. Neuro:  Nonfocal  Psych: Normal affect   Labs    Chemistry Recent Labs  Lab 06/06/17 1042 06/07/17 0258 06/07/17 1636 06/08/17 0416  NA 132* 130* 135 134*  K 4.9 5.0 4.3 4.2  CL 94* 94* 100* 99*  CO2 21* 19* 22 23  GLUCOSE 227* 208* 131* 132*  BUN 42* 56* 53* 44*  CREATININE 6.24* 6.90* 4.31* 2.92*  CALCIUM 8.7* 8.5* 8.2* 8.2*  PROT 6.5 6.4*  --   --   ALBUMIN 3.1* 2.8* 2.3* 2.4*  AST 29 24  --   --   ALT 16* 18  --   --   ALKPHOS 67 69  --   --   BILITOT 1.8* 1.4*  --   --   GFRNONAA 9* 8* 14* 22*  GFRAA 10* 9* 16* 26*  ANIONGAP 17* 17* 13 12     Hematology Recent Labs  Lab 06/06/17 1042 06/07/17 0258 06/08/17 0416  WBC 23.3* 22.3* 14.9*  RBC 5.55 5.49 4.92  HGB 15.2 14.8 13.0  HCT 44.5 44.1 39.7  MCV 80.2 80.3 80.7  MCH 27.4 27.0 26.4  MCHC 34.2 33.6 32.7  RDW 14.8 14.7 14.6  PLT 120* 105* 119*    Cardiac Enzymes Recent Labs  Lab 06/06/17 2208 06/07/17 0258  TROPONINI 0.24* 0.23*    Recent Labs  Lab 06/06/17 1053  TROPIPOC 0.13*     BNP Recent Labs  Lab 06/06/17 1042  BNP 991.6*     DDimer No results for input(s): DDIMER in the last 168 hours.   Radiology    Dg Chest 2 View  Result Date: 06/06/2017 CLINICAL DATA:  Chest pain for 4 days. EXAM: CHEST  2 VIEW COMPARISON:  July 12, 2014 FINDINGS: The mediastinal contour is normal. The heart size is enlarged. There is mild central pulmonary vascular congestion. There is no focal pneumonia or pleural effusion. The visualized skeletal structures are unremarkable. IMPRESSION: Cardiomegaly. Mild central pulmonary vascular congestion. No frank pulmonary edema. Electronically Signed   By: Abelardo Diesel M.D.   On: 06/06/2017 10:11   US Renal  Result Date: 06/06/2017 CLINICAL DATA:  Acute kidney injury EXAM: RENAL / URINARY TRACT ULTRASOUND COMPLETE COMPARISON:  CT  06/07/2010 FINDINGS: Right Kidney: Length: 13 cm. Cortical echogenicity is within normal limits. Mild right hydronephrosis and enlarged extrarenal pelvis. Left Kidney: Length: 12.6 cm.  Poorly visualized.  No definite hydronephrosis. Bladder: Bladder is distended, prevoid volume of 1334 cubic cm. Mildly echogenic mass within the bladder measuring 3.7 x 3.2 x 4.6 cm. IMPRESSION: 1. Mild right hydronephrosis 2. Poorly visible left kidney, but without definitive hydronephrosis 3. Distended urinary bladder. 4.6 cm echogenic mass in the bladder, could represent focal hematoma or hypovascular soft tissue mass. Correlation with direct visualization may be considered. Electronically Signed   By: Donavan Foil M.D.   On: 06/06/2017 20:34    Cardiac Studies   Echo:  06/08/17  Study Conclusions  - Left ventricle: The cavity size was normal. There was severe   concentric hypertrophy. Images were inadequate for LV wall motion   assessment. The study was not technically sufficient to allow   evaluation of LV diastolic dysfunction due to atrial   fibrillation. - Aortic valve: Trileaflet; mildly thickened, mildly calcified   leaflets. - Mitral valve: There was trivial regurgitation. - Left atrium: The atrium was severely dilated. - Right ventricle: The cavity size was moderately dilated. Wall   thickness was normal. Systolic function was mildly to moderately   reduced. - Right atrium: The atrium was moderately dilated. - Pulmonic valve: There was trivial regurgitation. - Impressions: Endocardial segments are not adequately visualized   to comment on LVF or wall motion. recommend limited echo with   definity contrast to determine accurate EF and wall motion.  Patient Profile     62 y.o. male with a hx of HTN cardiomyopathy, combined systolic and diastolic heart failure, HTN, HLD, DM, BPH, and cardiomegaly who is being seen for the evaluation of Afib at the request of Dr. Evette Doffing.  Assessment & Plan     ATRIAL FIB:  Converted to NSR.  Hypotension post procedure.  Start warfarin.  Continue heparin.  ACUTE ON CHRONIC DIASTOLIC HF:  Poor images.  I will consider follow up echo with contrast to further evaluate the EF.  He has had excellent UO with the Foley.  No change in therapy.    For questions or updates, please contact [facility] Please consult [email or web address] for contact info under Cardiology/STEMI.   Signed, Minus Breeding, MD  06/08/2017, 8:50 AM

## 2017-06-08 NOTE — Anesthesia Preprocedure Evaluation (Addendum)
Anesthesia Evaluation  Patient identified by MRN, date of birth, ID band Patient awake    Reviewed: Allergy & Precautions, NPO status , Patient's Chart, lab work & pertinent test results  History of Anesthesia Complications (+) Emergence Delirium  Airway Mallampati: II  TM Distance: >3 FB     Dental   Pulmonary sleep apnea ,    breath sounds clear to auscultation       Cardiovascular hypertension, +CHF  + dysrhythmias Atrial Fibrillation (-) Valvular Problems/Murmurs Rhythm:Regular Rate:Normal     Neuro/Psych    GI/Hepatic negative GI ROS, Neg liver ROS,   Endo/Other  diabetes  Renal/GU Renal disease     Musculoskeletal   Abdominal   Peds  Hematology   Anesthesia Other Findings   Reproductive/Obstetrics                            Anesthesia Physical Anesthesia Plan  ASA: III  Anesthesia Plan: General   Post-op Pain Management:    Induction:   PONV Risk Score and Plan: 2 and Treatment may vary due to age or medical condition  Airway Management Planned: Mask and Simple Face Mask  Additional Equipment:   Intra-op Plan:   Post-operative Plan:   Informed Consent: I have reviewed the patients History and Physical, chart, labs and discussed the procedure including the risks, benefits and alternatives for the proposed anesthesia with the patient or authorized representative who has indicated his/her understanding and acceptance.   Dental advisory given  Plan Discussed with: CRNA and Anesthesiologist  Anesthesia Plan Comments:         Anesthesia Quick Evaluation

## 2017-06-09 DIAGNOSIS — N2889 Other specified disorders of kidney and ureter: Secondary | ICD-10-CM

## 2017-06-09 DIAGNOSIS — I878 Other specified disorders of veins: Secondary | ICD-10-CM

## 2017-06-09 DIAGNOSIS — Z960 Presence of urogenital implants: Secondary | ICD-10-CM

## 2017-06-09 DIAGNOSIS — K5900 Constipation, unspecified: Secondary | ICD-10-CM

## 2017-06-09 DIAGNOSIS — Z794 Long term (current) use of insulin: Secondary | ICD-10-CM

## 2017-06-09 DIAGNOSIS — E119 Type 2 diabetes mellitus without complications: Secondary | ICD-10-CM

## 2017-06-09 DIAGNOSIS — Z7901 Long term (current) use of anticoagulants: Secondary | ICD-10-CM

## 2017-06-09 DIAGNOSIS — N178 Other acute kidney failure: Secondary | ICD-10-CM

## 2017-06-09 DIAGNOSIS — I4891 Unspecified atrial fibrillation: Secondary | ICD-10-CM

## 2017-06-09 DIAGNOSIS — K30 Functional dyspepsia: Secondary | ICD-10-CM

## 2017-06-09 LAB — PROTIME-INR
INR: 1.32
Prothrombin Time: 16.3 seconds — ABNORMAL HIGH (ref 11.4–15.2)

## 2017-06-09 LAB — RENAL FUNCTION PANEL
ALBUMIN: 2.4 g/dL — AB (ref 3.5–5.0)
ANION GAP: 9 (ref 5–15)
BUN: 33 mg/dL — ABNORMAL HIGH (ref 6–20)
CO2: 27 mmol/L (ref 22–32)
Calcium: 8.3 mg/dL — ABNORMAL LOW (ref 8.9–10.3)
Chloride: 100 mmol/L — ABNORMAL LOW (ref 101–111)
Creatinine, Ser: 1.72 mg/dL — ABNORMAL HIGH (ref 0.61–1.24)
GFR calc Af Amer: 48 mL/min — ABNORMAL LOW (ref >60)
GFR calc non Af Amer: 42 mL/min — ABNORMAL LOW (ref >60)
GLUCOSE: 105 mg/dL — AB (ref 65–99)
PHOSPHORUS: 2.8 mg/dL (ref 2.5–4.6)
Potassium: 3.7 mmol/L (ref 3.5–5.1)
Sodium: 136 mmol/L (ref 135–145)

## 2017-06-09 LAB — GLUCOSE, CAPILLARY
GLUCOSE-CAPILLARY: 94 mg/dL (ref 65–99)
GLUCOSE-CAPILLARY: 147 mg/dL — AB (ref 65–99)
Glucose-Capillary: 142 mg/dL — ABNORMAL HIGH (ref 65–99)
Glucose-Capillary: 80 mg/dL (ref 65–99)

## 2017-06-09 LAB — HEPARIN LEVEL (UNFRACTIONATED): Heparin Unfractionated: 0.52 IU/mL (ref 0.30–0.70)

## 2017-06-09 LAB — CBC
HCT: 38.9 % — ABNORMAL LOW (ref 39.0–52.0)
Hemoglobin: 12.5 g/dL — ABNORMAL LOW (ref 13.0–17.0)
MCH: 26.1 pg (ref 26.0–34.0)
MCHC: 32.1 g/dL (ref 30.0–36.0)
MCV: 81.2 fL (ref 78.0–100.0)
Platelets: 127 10*3/uL — ABNORMAL LOW (ref 150–400)
RBC: 4.79 MIL/uL (ref 4.22–5.81)
RDW: 14.6 % (ref 11.5–15.5)
WBC: 9.2 10*3/uL (ref 4.0–10.5)

## 2017-06-09 MED ORDER — MAGNESIUM CITRATE PO SOLN
1.0000 | Freq: Once | ORAL | Status: AC
  Administered 2017-06-09: 1 via ORAL
  Filled 2017-06-09: qty 296

## 2017-06-09 MED ORDER — WARFARIN SODIUM 10 MG PO TABS
10.0000 mg | ORAL_TABLET | Freq: Once | ORAL | Status: AC
  Administered 2017-06-09: 10 mg via ORAL
  Filled 2017-06-09: qty 1

## 2017-06-09 MED ORDER — DULOXETINE HCL 60 MG PO CPEP
60.0000 mg | ORAL_CAPSULE | Freq: Once | ORAL | Status: AC
  Administered 2017-06-09: 60 mg via ORAL
  Filled 2017-06-09: qty 1

## 2017-06-09 MED ORDER — APIXABAN 5 MG PO TABS
5.0000 mg | ORAL_TABLET | Freq: Two times a day (BID) | ORAL | Status: DC
  Administered 2017-06-09 – 2017-06-10 (×2): 5 mg via ORAL
  Filled 2017-06-09 (×2): qty 1

## 2017-06-09 MED ORDER — FUROSEMIDE 10 MG/ML IJ SOLN
40.0000 mg | Freq: Every day | INTRAMUSCULAR | Status: DC
  Administered 2017-06-09 – 2017-06-11 (×3): 40 mg via INTRAVENOUS
  Filled 2017-06-09 (×3): qty 4

## 2017-06-09 MED ORDER — SENNA 8.6 MG PO TABS: 1.0000 | ORAL_TABLET | Freq: Every day | ORAL | Status: DC | PRN

## 2017-06-09 NOTE — Progress Notes (Signed)
[place] for Heparin and Warfarin  Indication: Atrial flutter/Atrial fib  Allergies  Allergen Reactions  . Lisinopril Cough    Patient Measurements: Height: 6\' 1"  (185.4 cm) Weight: (!) 307 lb 12.2 oz (139.6 kg) IBW/kg (Calculated) : 79.9 Heparin Dosing Weight: 114kg  Vital Signs: Temp: 97.6 F (36.4 C) (01/24 0424) Temp Source: Oral (01/24 0424) BP: 128/94 (01/24 0424) Pulse Rate: 91 (01/24 0424)  Labs: Recent Labs    06/06/17 2208  06/07/17 0258  06/07/17 1636 06/08/17 0416 06/08/17 1601 06/09/17 0354 06/09/17 0622  HGB  --   --  14.8  --   --  13.0  --  12.5*  --   HCT  --   --  44.1  --   --  39.7  --  38.9*  --   PLT  --   --  105*  --   --  119*  --  127*  --   LABPROT  --   --   --   --   --   --  15.8* 16.3*  --   INR  --   --   --   --   --   --  1.27 1.32  --   HEPARINUNFRC  --    < >  --    < > 0.17* 0.78* 0.78*  --  0.52  CREATININE  --   --  6.90*  --  4.31* 2.92*  --  1.72*  --   TROPONINI 0.24*  --  0.23*  --   --   --   --   --   --    < > = values in this interval not displayed.    Estimated Creatinine Clearance: 67.9 mL/min (A) (by C-G formula based on SCr of 1.72 mg/dL (H)).  Assessment: 62 y.o. male with Afib on heparin and warfarin. Warfarin was resumed yesterday, INR 1.3 as expected. Heparin level is therapeutic this morning.    Goal of Therapy:  Heparin level 0.3-0.7 units/ml Monitor platelets by anticoagulation protocol: Yes   Plan:  Continue heparin drip at 2000 units/hr Daily HL, CBC, INR Warfarin 10 mg po x1 Check cHL  Harvel Quale  06/09/2017 7:10 AM

## 2017-06-09 NOTE — Progress Notes (Signed)
[place] for Eliquis Indication: Atrial flutter/Atrial fib  Allergies  Allergen Reactions  . Lisinopril Cough    Patient Measurements: Height: 6\' 1"  (185.4 cm) Weight: (!) 301 lb (136.5 kg) IBW/kg (Calculated) : 79.9 Heparin Dosing Weight: 114kg  Vital Signs: Temp: 97.9 F (36.6 C) (01/24 1100) Temp Source: Oral (01/24 1100) BP: 134/92 (01/24 1206) Pulse Rate: 103 (01/24 1206)  Labs: Recent Labs    06/06/17 2208  06/07/17 0258  06/07/17 1636 06/08/17 0416 06/08/17 1601 06/09/17 0354 06/09/17 0622  HGB  --    < > 14.8  --   --  13.0  --  12.5*  --   HCT  --   --  44.1  --   --  39.7  --  38.9*  --   PLT  --   --  105*  --   --  119*  --  127*  --   LABPROT  --   --   --   --   --   --  15.8* 16.3*  --   INR  --   --   --   --   --   --  1.27 1.32  --   HEPARINUNFRC  --    < >  --    < > 0.17* 0.78* 0.78*  --  0.52  CREATININE  --    < > 6.90*  --  4.31* 2.92*  --  1.72*  --   TROPONINI 0.24*  --  0.23*  --   --   --   --   --   --    < > = values in this interval not displayed.    Estimated Creatinine Clearance: 67 mL/min (A) (by C-G formula based on SCr of 1.72 mg/dL (H)).  Assessment: 62 y.o. male with Afib initially started on heparin bridge to coumadin. INR 1.32 this morning. Now to switch to Eliquis. Noted pt received 10 mg coumadin this evening, INR likely will still be subtherapeutic after this dose.  hgb 12.5, pltc 127K stable.  Goal of Therapy:  Monitor platelets by anticoagulation protocol: Yes   Plan:  Eliquis 5mg  PO BID first dose tonight. Monitor CBC, s/sx of bleeding.  Maryanna Shape, PharmD, BCPS  Clinical Pharmacist  Pager: [phone]    06/09/2017 5:59 PM

## 2017-06-09 NOTE — Progress Notes (Addendum)
   Subjective: Terry Parks was seen sitting up in chair by his bedside.  He states that he is doing well and denies any shortness of breath or chest pain.  He states that he still has not had a bowel movement.  Right lower extremity pain and feels that he is still swollen in his extremities.  Objective:  Vital signs in last 24 hours: Vitals:   06/08/17 1250 06/08/17 1632 06/08/17 2344 06/09/17 0424  BP: 103/88 119/82 127/77 (!) 128/94  Pulse: (!) 39 95 (!) 104 91  Resp: 15 20 (!) 24 (!) 25  Temp:   98 F (36.7 C) 97.6 F (36.4 C)  TempSrc:   Oral Oral  SpO2: 96% 93% 96% 95%  Weight:      Height:       Physical Exam  Constitutional: He appears well-developed and well-nourished. No distress.  HENT:  Head: Normocephalic and atraumatic.  Eyes: Conjunctivae are normal.  Neck: JVD (could not be adequately assessed) present.  Cardiovascular: Normal rate, regular rhythm, normal heart sounds and intact distal pulses.  Respiratory: Effort normal and breath sounds normal. No respiratory distress. He has no wheezes.  GI: Soft. Bowel sounds are normal. He exhibits no distension. There is no tenderness.  Musculoskeletal: He exhibits edema.  Skin: He is not diaphoretic. No erythema.  Psychiatric: He has a normal mood and affect. His behavior is normal. Judgment and thought content normal.    Assessment/Plan: Acute on chronic combinedsystolic and diastolic heart failure Patient has net -955mL over the past 24 hrs and is still 307lbs today 06/09/17. The patient's dry weight is approximately 290lbs.   The patient continues to appear hypervolemic today and it appears that he has not put out a lot yesterday. Therefore, will start lasix.   -IV Lasix 40mg  qd -continue toMonitor I/O -heart healthy/carb modified diet   Atrial Fibrillation CHA2DS2-VASc=3. TEE DCCV was done successfully with conversion to sinus rhythm on 06/08/17. The patient's INR=1.32 today 06/09/17.  -continue heparin and  bridging to warfarin -continue amiodarone  Acute renal failure secondary to post renal obstruction Presented with a creatinine of 6.2. His baseline is 1-1.1. The patient's creatinine continues to trend down and today 1/24 it is cr=1.72.  -continue monitoring renal function -continue coude cath for 4-6 weeks due to bladder distention -f/u urology outpatient   Diabetes mellitus type 2 The patient'sblood glucose has been ranging 90-100. HgA1c=8.1  -continue Lantus 40units  -continue SSI  Constipation/ GI indigestion  -GI cocktail -magnesium citrate bottle prescribed -senna prn  Dispo: Anticipated discharge in approximately 1-2 day(s).   [name], MD Internal Medicine PGY1 [phone] 06/09/2017, 6:30 AM

## 2017-06-09 NOTE — Progress Notes (Signed)
RN will call when pt is ready to be placed on CPAP.

## 2017-06-09 NOTE — Progress Notes (Signed)
Progress Note  Patient Name: Terry Parks Date of Encounter: 06/09/2017  Primary Cardiologist:   [name], MD   Subjective   No chest pain.  Still cannot swallow food.  Breathing OK.   Inpatient Medications    Scheduled Meds: . DULoxetine  60 mg Oral Daily  . furosemide  40 mg Intravenous Daily  . insulin aspart  0-9 Units Subcutaneous TID WC  . insulin glargine  40 Units Subcutaneous QHS  . pantoprazole  40 mg Oral Daily  . polyethylene glycol  17 g Oral BID  . senna-docusate  2 tablet Oral BID  . sodium chloride flush  10-40 mL Intracatheter Q12H  . sodium chloride flush  3 mL Intravenous Q12H  . warfarin  10 mg Oral ONCE-1800  . Warfarin - Pharmacist Dosing Inpatient   Does not apply q1800   Continuous Infusions: . sodium chloride Stopped (06/08/17 0915)  . heparin 2,000 Units/hr (06/09/17 0500)   PRN Meds: acetaminophen **OR** acetaminophen, gi cocktail, ondansetron, phenol, sodium chloride flush, sodium chloride flush   Vital Signs    Vitals:   06/08/17 1250 06/08/17 1632 06/08/17 2344 06/09/17 0424  BP: 103/88 119/82 127/77 (!) 128/94  Pulse: (!) 39 95 (!) 104 91  Resp: 15 20 (!) 24 (!) 25  Temp:   98 F (36.7 C) 97.6 F (36.4 C)  TempSrc:   Oral Oral  SpO2: 96% 93% 96% 95%  Weight:      Height:        Intake/Output Summary (Last 24 hours) at 06/09/2017 1029 Last data filed at 06/09/2017 0500 Gross per 24 hour  Intake 349 ml  Output 1300 ml  Net -951 ml   Filed Weights   06/07/17 0508 06/08/17 0327 06/08/17 0850  Weight: (!) 310 lb 13.6 oz (141 kg) (!) 307 lb 12.2 oz (139.6 kg) (!) 307 lb 12.2 oz (139.6 kg)    Telemetry    NSR with atrial ectopy. - Personally Reviewed  ECG    NA - Personally Reviewed  Physical Exam   GEN: No  acute distress.   Neck: No  JVD Cardiac: RRR, no murmurs, rubs, or gallops.  Respiratory: Clear   to auscultation bilaterally. GI: Soft, nontender, non-distended, normal bowel sounds  MS:  Trace  edema; No deformity. Neuro:   Nonfocal  Psych: Oriented and appropriate    Labs    Chemistry Recent Labs  Lab 06/06/17 1042 06/07/17 0258 06/07/17 1636 06/08/17 0416 06/09/17 0354  NA 132* 130* 135 134* 136  K 4.9 5.0 4.3 4.2 3.7  CL 94* 94* 100* 99* 100*  CO2 21* 19* 22 23 27   GLUCOSE 227* 208* 131* 132* 105*  BUN 42* 56* 53* 44* 33*  CREATININE 6.24* 6.90* 4.31* 2.92* 1.72*  CALCIUM 8.7* 8.5* 8.2* 8.2* 8.3*  PROT 6.5 6.4*  --   --   --   ALBUMIN 3.1* 2.8* 2.3* 2.4* 2.4*  AST 29 24  --   --   --   ALT 16* 18  --   --   --   ALKPHOS 67 69  --   --   --   BILITOT 1.8* 1.4*  --   --   --   GFRNONAA 9* 8* 14* 22* 42*  GFRAA 10* 9* 16* 26* 48*  ANIONGAP 17* 17* 13 12 9      Hematology Recent Labs  Lab 06/07/17 0258 06/08/17 0416 06/09/17 0354  WBC 22.3* 14.9* 9.2  RBC 5.49 4.92 4.79  HGB  14.8 13.0 12.5*  HCT 44.1 39.7 38.9*  MCV 80.3 80.7 81.2  MCH 27.0 26.4 26.1  MCHC 33.6 32.7 32.1  RDW 14.7 14.6 14.6  PLT 105* 119* 127*    Cardiac Enzymes Recent Labs  Lab 06/06/17 2208 06/07/17 0258  TROPONINI 0.24* 0.23*    Recent Labs  Lab 06/06/17 1053  TROPIPOC 0.13*     BNP Recent Labs  Lab 06/06/17 1042  BNP 991.6*     DDimer No results for input(s): DDIMER in the last 168 hours.   Radiology    No results found.  Cardiac Studies   Echo:  06/08/17  Study Conclusions  - Left ventricle: The cavity size was normal. There was severe   concentric hypertrophy. Images were inadequate for LV wall motion   assessment. The study was not technically sufficient to allow   evaluation of LV diastolic dysfunction due to atrial   fibrillation. - Aortic valve: Trileaflet; mildly thickened, mildly calcified   leaflets. - Mitral valve: There was trivial regurgitation. - Left atrium: The atrium was severely dilated. - Right ventricle: The cavity size was moderately dilated. Wall   thickness was normal. Systolic function was mildly to moderately    reduced. - Right atrium: The atrium was moderately dilated. - Pulmonic valve: There was trivial regurgitation. - Impressions: Endocardial segments are not adequately visualized   to comment on LVF or wall motion. recommend limited echo with   definity contrast to determine accurate EF and wall motion.  Patient Profile     62 y.o. male with a hx of HTN cardiomyopathy, combined systolic and diastolic heart failure, HTN, HLD, DM, BPH, and cardiomegaly who is being seen for the evaluation of Afib at the request of Dr. Evette Doffing.  Assessment & Plan    ATRIAL FIB:  Converted to NSR.  On warfarin.  Continue heparin until INR greater than 2.     ACUTE ON CHRONIC DIASTOLIC HF:    Down 7.6 liters since admission.  Looks like he did not get Lasix yesterday but this has been restarted today.  Continue current therapy.  Echo was suboptimal and I will repeat with contrast in the future.   AKI:   Creat continues to improve.    For questions or updates, please contact [place] Please consult [email or web address] for contact info under Cardiology/STEMI.   Signed, Minus Breeding, MD  06/09/2017, 10:29 AM

## 2017-06-10 DIAGNOSIS — E1140 Type 2 diabetes mellitus with diabetic neuropathy, unspecified: Secondary | ICD-10-CM

## 2017-06-10 DIAGNOSIS — Z79899 Other long term (current) drug therapy: Secondary | ICD-10-CM

## 2017-06-10 DIAGNOSIS — I4892 Unspecified atrial flutter: Secondary | ICD-10-CM

## 2017-06-10 LAB — CBC
HEMATOCRIT: 37.6 % — AB (ref 39.0–52.0)
Hemoglobin: 11.9 g/dL — ABNORMAL LOW (ref 13.0–17.0)
MCH: 25.9 pg — AB (ref 26.0–34.0)
MCHC: 31.6 g/dL (ref 30.0–36.0)
MCV: 81.9 fL (ref 78.0–100.0)
PLATELETS: 128 10*3/uL — AB (ref 150–400)
RBC: 4.59 MIL/uL (ref 4.22–5.81)
RDW: 14.5 % (ref 11.5–15.5)
WBC: 8.4 10*3/uL (ref 4.0–10.5)

## 2017-06-10 LAB — GLUCOSE, CAPILLARY
GLUCOSE-CAPILLARY: 100 mg/dL — AB (ref 65–99)
GLUCOSE-CAPILLARY: 213 mg/dL — AB (ref 65–99)
Glucose-Capillary: 268 mg/dL — ABNORMAL HIGH (ref 65–99)

## 2017-06-10 LAB — RENAL FUNCTION PANEL
ANION GAP: 10 (ref 5–15)
Albumin: 2.4 g/dL — ABNORMAL LOW (ref 3.5–5.0)
BUN: 18 mg/dL (ref 6–20)
CHLORIDE: 100 mmol/L — AB (ref 101–111)
CO2: 28 mmol/L (ref 22–32)
CREATININE: 1.23 mg/dL (ref 0.61–1.24)
Calcium: 8.2 mg/dL — ABNORMAL LOW (ref 8.9–10.3)
Glucose, Bld: 124 mg/dL — ABNORMAL HIGH (ref 65–99)
POTASSIUM: 3.4 mmol/L — AB (ref 3.5–5.1)
Phosphorus: 3.1 mg/dL (ref 2.5–4.6)
Sodium: 138 mmol/L (ref 135–145)

## 2017-06-10 LAB — T4, FREE: Free T4: 1 ng/dL (ref 0.61–1.12)

## 2017-06-10 LAB — PROTIME-INR
INR: 2.41
Prothrombin Time: 26.1 seconds — ABNORMAL HIGH (ref 11.4–15.2)

## 2017-06-10 MED ORDER — CARVEDILOL 12.5 MG PO TABS
12.5000 mg | ORAL_TABLET | Freq: Two times a day (BID) | ORAL | Status: DC
  Administered 2017-06-10 – 2017-06-14 (×9): 12.5 mg via ORAL
  Filled 2017-06-10 (×9): qty 1

## 2017-06-10 MED ORDER — AMIODARONE HCL IN DEXTROSE 360-4.14 MG/200ML-% IV SOLN: 60.0000 mg/h | INTRAVENOUS | Status: DC

## 2017-06-10 MED ORDER — DILTIAZEM HCL 25 MG/5ML IV SOLN: 5.0000 mg | Freq: Once | INTRAVENOUS | Status: DC

## 2017-06-10 MED ORDER — SUCRALFATE 1 G PO TABS: 1.0000 g | ORAL_TABLET | Freq: Three times a day (TID) | ORAL | Status: DC

## 2017-06-10 MED ORDER — SUCRALFATE 1 G PO TABS
1.0000 g | ORAL_TABLET | Freq: Three times a day (TID) | ORAL | Status: DC
  Administered 2017-06-10 (×3): 1 g via ORAL
  Filled 2017-06-10 (×3): qty 1

## 2017-06-10 MED ORDER — AMIODARONE HCL IN DEXTROSE 360-4.14 MG/200ML-% IV SOLN
60.0000 mg/h | INTRAVENOUS | Status: AC
  Administered 2017-06-10: 60 mg/h via INTRAVENOUS
  Filled 2017-06-10: qty 200

## 2017-06-10 MED ORDER — POTASSIUM CHLORIDE CRYS ER 20 MEQ PO TBCR
40.0000 meq | EXTENDED_RELEASE_TABLET | Freq: Once | ORAL | Status: AC
  Administered 2017-06-10: 40 meq via ORAL
  Filled 2017-06-10: qty 2

## 2017-06-10 MED ORDER — AMIODARONE HCL IN DEXTROSE 360-4.14 MG/200ML-% IV SOLN
30.0000 mg/h | INTRAVENOUS | Status: DC
  Administered 2017-06-10 – 2017-06-11 (×2): 30 mg/h via INTRAVENOUS
  Filled 2017-06-10 (×2): qty 200

## 2017-06-10 NOTE — Discharge Instructions (Addendum)
It was a pleasure to take care of you Terry Parks. During your hospitalization you were taken care of for your heart failure and for urinary retention.  -please take all your medication as indicated on the discharge summary -please follow up in Internal Medicine clinic in a week   Information on my medicine - XARELTO (Rivaroxaban)  This medication education was reviewed with me or my healthcare representative as part of my discharge preparation.  The pharmacist that spoke with me during my hospital stay was:  Deboraha Sprang, [facility]  Why was Xarelto prescribed for you? Xarelto was prescribed for you to reduce the risk of a blood clot forming that can cause a stroke if you have a medical condition called atrial fibrillation (a type of irregular heartbeat).  What do you need to know about xarelto ? Take your Xarelto ONCE DAILY at the same time every day with your evening meal. If you have difficulty swallowing the tablet whole, you may crush it and mix in applesauce just prior to taking your dose.  Take Xarelto exactly as prescribed by your doctor and DO NOT stop taking Xarelto without talking to the doctor who prescribed the medication.  Stopping without other stroke prevention medication to take the place of Xarelto may increase your risk of developing a clot that causes a stroke.  Refill your prescription before you run out.  After discharge, you should have regular check-up appointments with your healthcare provider that is prescribing your Xarelto.  In the future your dose may need to be changed if your kidney function or weight changes by a significant amount.  What do you do if you miss a dose? If you are taking Xarelto ONCE DAILY and you miss a dose, take it as soon as you remember on the same day then continue your regularly scheduled once daily regimen the next day. Do not take two doses of Xarelto at the same time or on the same day.   Important Safety Information A  possible side effect of Xarelto is bleeding. You should call your healthcare provider right away if you experience any of the following: ? Bleeding from an injury or your nose that does not stop. ? Unusual colored urine (red or dark brown) or unusual colored stools (red or black). ? Unusual bruising for unknown reasons. ? A serious fall or if you hit your head (even if there is no bleeding).  Some medicines may interact with Xarelto and might increase your risk of bleeding while on Xarelto. To help avoid this, consult your healthcare provider or pharmacist prior to using any new prescription or non-prescription medications, including herbals, vitamins, non-steroidal anti-inflammatory drugs (NSAIDs) and supplements.  This website has more information on Xarelto: [email or web address]

## 2017-06-10 NOTE — Progress Notes (Signed)
[place] for Eliquis Indication: Atrial flutter/Atrial fib  Allergies  Allergen Reactions  . Lisinopril Cough    Patient Measurements: Height: 6\' 1"  (185.4 cm) Weight: (!) 301 lb (136.5 kg) IBW/kg (Calculated) : 79.9 Heparin Dosing Weight: 114kg  Vital Signs: Temp: 98.1 F (36.7 C) (01/25 1900) Temp Source: Oral (01/25 1900) BP: 144/97 (01/25 1900) Pulse Rate: 92 (01/25 1900)  Labs: Recent Labs    06/08/17 0416 06/08/17 1601 06/09/17 0354 06/09/17 0622 06/10/17 0427 06/10/17 1414  HGB 13.0  --  12.5*  --  11.9*  --   HCT 39.7  --  38.9*  --  37.6*  --   PLT 119*  --  127*  --  128*  --   LABPROT  --  15.8* 16.3*  --   --  26.1*  INR  --  1.27 1.32  --   --  2.41  HEPARINUNFRC 0.78* 0.78*  --  0.52  --   --   CREATININE 2.92*  --  1.72*  --  1.23  --     Estimated Creatinine Clearance: 93.8 mL/min (by C-G formula based on SCr of 1.23 mg/dL).  Assessment: 62 y.o. male with Afib initially started on heparin bridge to coumadin. INR 1.32 this morning. Now to switch to Eliquis. Noted pt received 10 mg coumadin this evening, INR likely will still be subtherapeutic after this dose.  hgb 11.9, pltc 128K stable.  Apixaban started 1/24 when INR = 1.3, but pt had two 10 mg doses of Coumadin prior to switching to apixaban, and now INR = 2.4.    Goal of Therapy:  Monitor platelets by anticoagulation protocol: Yes   Plan:  Hold apixaban doses for now, resume when INR <=2. Monitor CBC, s/sx of bleeding.  Uvaldo Rising, BCPS  Clinical Pharmacist Pager [phone]  06/10/2017 8:14 PM

## 2017-06-10 NOTE — Progress Notes (Addendum)
   Subjective:   Terry Parks was seen resting in chair by his bed this morning. He still appeared lethargic. He mentioned that he has had 2 bowel movements. The patient reports subjective cold lower extremities and he states that he feels like his   Objective:  Vital signs in last 24 hours: Vitals:   06/09/17 1206 06/09/17 2042 06/09/17 2200 06/10/17 0400  BP: (!) 134/92  (!) 139/99 (!) 120/92  Pulse: (!) 103  (!) 103 94  Resp: (!) 33     Temp:  (!) 97.5 F (36.4 C)  98.5 F (36.9 C)  TempSrc:    Oral  SpO2:   95% 92%  Weight:      Height:       Physical Exam  Constitutional: He appears well-developed and well-nourished. No distress.  HENT:  Head: Normocephalic and atraumatic.  Eyes: Conjunctivae are normal.  Cardiovascular: Normal rate, regular rhythm, normal heart sounds and intact distal pulses.  Respiratory: Effort normal and breath sounds normal. No respiratory distress. He has no wheezes.  GI: Soft. Bowel sounds are normal. He exhibits no distension. There is no tenderness.  Musculoskeletal: He exhibits edema (pitting edema 2+).  Neurological: He is alert.  Skin: He is not diaphoretic. No erythema.  Lower extremity are less cool in comparison to previous day  Psychiatric: He has a normal mood and affect. His behavior is normal. Judgment and thought content normal.    Assessment/Plan:  Acute on chronic combinedsystolic and diastolic heart failure Patient has net -4354mL over the past 24 hrs and last weight is 301lbs from 307lbs pm 1/23. Pending weight today 06/10/17. The patient's dry weight is approximately 290lbs.   The patient continues to appear hypervolemic with bilateral lower extremity swelling   -continue IV Lasix 40mg  qd -continue toMonitor I/O -heart healthy/carb modified diet  -started carvedilol 12.5mg  bid  Atrial Fibrillation CHA2DS2-VASc=3.TEE DCCV was done successfully with conversion to sinus rhythm on 06/08/17. The patient was bleeding from  his IV site and in urine. Heparin and warfarin were stopped and started on DOAC.   -stopped heparin and warfarin and starte on eliquis 5mg . -Appreciate Dr. Maudie Mercury assistance in the patient getting affordable doac outpatient as well, will provide samples -continue amiodarone -pending pt and inr -continue to encourage the patient to use cpap throughout night  Acute renal failuresecondary to post renal obstruction Presented with a creatinine of 6.2. His baseline is 1-1.1. The patient's creatinine today is back to baseline of cr=1.23.  -continue coude cath for 4-6 weeks due to bladder distention -f/u urology outpatient   Diabetes mellitus type 2 The patient'sblood glucose has been ranging 80-142. HgA1c=8.1  -continue Lantus 40units  -continue SSI -continue cymbalta for neuropathy  Constipation/ GI indigestion  -GI cocktail -magnesium citrate bottle prescribed -senna prn -Given carafate  Dispo: Anticipated discharge in approximately 2-3 day(s).   [name], MD Internal Medicine PGY1 Pager:[phone] 06/10/2017, 8:24 AM

## 2017-06-10 NOTE — Progress Notes (Signed)
Progress Note  Patient Name: Terry Parks Date of Encounter: 06/10/2017  Primary Cardiologist: [name], MD   Subjective   Has some epigastric pain, abdominal fullness, and pain with swallowing solid foods. Had a BM yesterday and feels it may be slightly better. No SOB. Ambulating to the bathroom. No dizziness, lightheadedness, or palpitations.   Inpatient Medications    Scheduled Meds: . apixaban  5 mg Oral BID  . DULoxetine  60 mg Oral Daily  . furosemide  40 mg Intravenous Daily  . insulin aspart  0-9 Units Subcutaneous TID WC  . insulin glargine  40 Units Subcutaneous QHS  . pantoprazole  40 mg Oral Daily  . sodium chloride flush  10-40 mL Intracatheter Q12H  . sodium chloride flush  3 mL Intravenous Q12H   Continuous Infusions: . sodium chloride Stopped (06/08/17 0915)   PRN Meds: acetaminophen **OR** acetaminophen, gi cocktail, ondansetron, phenol, senna, sodium chloride flush, sodium chloride flush   Vital Signs    Vitals:   06/09/17 1206 06/09/17 2042 06/09/17 2200 06/10/17 0400  BP: (!) 134/92  (!) 139/99 (!) 120/92  Pulse: (!) 103  (!) 103 94  Resp: (!) 33     Temp:  (!) 97.5 F (36.4 C)  98.5 F (36.9 C)  TempSrc:    Oral  SpO2:   95% 92%  Weight:      Height:        Intake/Output Summary (Last 24 hours) at 06/10/2017 0903 Last data filed at 06/09/2017 1800 Gross per 24 hour  Intake 300 ml  Output 4650 ml  Net -4350 ml   Filed Weights   06/08/17 0327 06/08/17 0850 06/09/17 1100  Weight: (!) 307 lb 12.2 oz (139.6 kg) (!) 307 lb 12.2 oz (139.6 kg) (!) 301 lb (136.5 kg)    Telemetry    NSR with PAC's. 90's. 10 beat run of SVT this am  - Personally Reviewed  ECG    No new tracings  Physical Exam   GEN: No acute distress.   Neck: JVD difficult to assess due to body habitus Cardiac: RRR, no murmurs, rubs, or gallops.  Respiratory: Clear to auscultation bilaterally. GI: obese, Soft, nontender, +distension MS:  No deformity. +2  edema BLE Neuro:  Nonfocal  Psych: Normal affect   Labs    Chemistry Recent Labs  Lab 06/06/17 1042 06/07/17 0258  06/08/17 0416 06/09/17 0354 06/10/17 0427  NA 132* 130*   < > 134* 136 138  K 4.9 5.0   < > 4.2 3.7 3.4*  CL 94* 94*   < > 99* 100* 100*  CO2 21* 19*   < > 23 27 28   GLUCOSE 227* 208*   < > 132* 105* 124*  BUN 42* 56*   < > 44* 33* 18  CREATININE 6.24* 6.90*   < > 2.92* 1.72* 1.23  CALCIUM 8.7* 8.5*   < > 8.2* 8.3* 8.2*  PROT 6.5 6.4*  --   --   --   --   ALBUMIN 3.1* 2.8*   < > 2.4* 2.4* 2.4*  AST 29 24  --   --   --   --   ALT 16* 18  --   --   --   --   ALKPHOS 67 69  --   --   --   --   BILITOT 1.8* 1.4*  --   --   --   --   GFRNONAA 9* 8*   < >  22* 42* >60  GFRAA 10* 9*   < > 26* 48* >60  ANIONGAP 17* 17*   < > 12 9 10    < > = values in this interval not displayed.     Hematology Recent Labs  Lab 06/08/17 0416 06/09/17 0354 06/10/17 0427  WBC 14.9* 9.2 8.4  RBC 4.92 4.79 4.59  HGB 13.0 12.5* 11.9*  HCT 39.7 38.9* 37.6*  MCV 80.7 81.2 81.9  MCH 26.4 26.1 25.9*  MCHC 32.7 32.1 31.6  RDW 14.6 14.6 14.5  PLT 119* 127* 128*    Cardiac Enzymes Recent Labs  Lab 06/06/17 2208 06/07/17 0258  TROPONINI 0.24* 0.23*    Recent Labs  Lab 06/06/17 1053  TROPIPOC 0.13*     BNP Recent Labs  Lab 06/06/17 1042  BNP 991.6*     DDimer No results for input(s): DDIMER in the last 168 hours.   Radiology    No results found.  Cardiac Studies   Echo:  06/08/17 - Left ventricle: The cavity size was normal. There was severe concentric hypertrophy. Images were inadequate for LV wall motion assessment. The study was not technically sufficient to allow evaluation of LV diastolic dysfunction due to atrial fibrillation. - Aortic valve: Trileaflet; mildly thickened, mildly calcified leaflets. - Mitral valve: There was trivial regurgitation. - Left atrium: The atrium was severely dilated. - Right ventricle: The cavity size was  moderately dilated. Wall thickness was normal. Systolic function was mildly to moderately reduced. - Right atrium: The atrium was moderately dilated. - Pulmonic valve: There was trivial regurgitation. - Impressions: Endocardial segments are not adequately visualized to comment on LVF or wall motion. recommend limited echo with definity contrast to determine accurate EF and wall motion.  Echo 06/16/15: - Left ventricle: The cavity size was normal. Wall thickness was   increased in a pattern of mild LVH. Systolic function was mildly   to moderately reduced. The estimated ejection fraction was in the   range of 40% to 45%. Diffuse hypokinesis. Doppler parameters are   consistent with abnormal left ventricular relaxation (grade 1   diastolic dysfunction). The E/e ratio is between 8-15, suggesting   indeterminate LV filling pressure. - Left atrium: The atrium was normal in size. - Inferior vena cava: The vessel was normal in size. The   respirophasic diameter changes were in the normal range (= 50%),   consistent with normal central venous pressure.  Myoview 11/28/14:  The left ventricular ejection fraction is severely decreased (<30%).  Nuclear stress EF: 25%. Diaphragmatic attenuation with mild LV dilatation and severeal global HK C/W NISCM  Patient Profile   62 y.o. male with a hx of HTN cardiomyopathy, combined systolic and diastolic heart failure, HTN, HLD, DM, BPH, and cardiomegalywho is being seen for the evaluation of Afibat the request of Dr. Evette Doffing.  Assessment & Plan    1. A fib - DCCV on 1/23 am. Still in NSR/ST 90-100's. - Started on apixaban yesterday. CHA2DS2-VASc Score is 3 (CHF, HTN, DM) - Not currently on anti-arrhymics or rate control medications. Was previously on amiodarone drip. A fib was thought to be brought on from acute illness or acute CHF.  2. Acute on chronic systolic and diastolic HF, NICM, EF 13-24% with grade 1 DD (06/16/2015) - Echo this  admission suboptimal - plan to repeat with contrast in the future - Volume still elevated. Down 24 lbs this admission. Dry weight: ~295 lbs. This am: 301 lbs.  - Continue 40 mg IV lasix daily.  He missed a dose on 1/23, but was very responsive yesterday with net -4.3 liters - Will restart carvedilol at 12.5 mg BID. Home dose is 25 mg BID. SBP 120-130's and HR 90-100's.    3. AKI r/t post renal obstruction requiring coude catherization - Continues to improve. Creatinine is 1.2 this am. (baseline 1-1.1) - Consider ARB tomorrow (was on [place])  4. HTN - SBP 120-130's. Had previously been hypotensive with A fib RVR and home amlodipine and imdur were held.  - Was on ARB prior to admission. Consider adding tomorrow if creatinine continues to improve. - Continue CPAP HS.  5. Epigastric pain and pain with swallowing - ?related to constipation and/or gastroparesis  - Per primary  6. Hypokalemia - 3.4 this am. Has not required supplements, but may need with continued diuresis now that his creatinine has normalized - Follow daily BMP  For questions or updates, please contact [place] Please consult [email or web address] for contact info under Cardiology/STEMI.   Signed, Georgiana Shore, NP  06/10/2017, 9:03 AM    History and all data above reviewed.  Patient examined.  I agree with the findings as above.  He still is having trouble swallowing.  Did not eat breakfast and only slight dinner last night. He appears to be back in flutter.    The patient exam reveals COR:RRR, tachy  ,  Lungs: Clear  ,  Abd: Positive bowel sounds, no rebound no guarding, Ext Moderate edema  .  All available labs, radiology testing, previous records reviewed. Agree with documented assessment and plan. Atrial fib/flutter:  Appears to be back in flutter.  I will restart IV amio as he is going fast and confirm with an EKG.  TSH is low but I don't see a T3/T4.  We will order this.  He is on Eliquis.  Now on beta blocker as well.    Difficulty swallowing:  I would suggest a GI consult since swallowing continues to be a significant problem.  HTN:  On beta blocker now.  Consider resuming low dose ARB in the morning.  AKI:  Continues to improve.  Significant diuresis.    Jeneen Rinks [facility]  10:47 AM  06/10/2017

## 2017-06-11 ENCOUNTER — Inpatient Hospital Stay (HOSPITAL_COMMUNITY): Payer: Self-pay

## 2017-06-11 DIAGNOSIS — R748 Abnormal levels of other serum enzymes: Secondary | ICD-10-CM

## 2017-06-11 DIAGNOSIS — R7989 Other specified abnormal findings of blood chemistry: Secondary | ICD-10-CM

## 2017-06-11 DIAGNOSIS — R778 Other specified abnormalities of plasma proteins: Secondary | ICD-10-CM

## 2017-06-11 DIAGNOSIS — I481 Persistent atrial fibrillation: Secondary | ICD-10-CM

## 2017-06-11 DIAGNOSIS — I5043 Acute on chronic combined systolic (congestive) and diastolic (congestive) heart failure: Secondary | ICD-10-CM

## 2017-06-11 DIAGNOSIS — N179 Acute kidney failure, unspecified: Secondary | ICD-10-CM

## 2017-06-11 LAB — ECHOCARDIOGRAM LIMITED
HEIGHTINCHES: 73 in
WEIGHTICAEL: 4739.2 [oz_av]

## 2017-06-11 LAB — RENAL FUNCTION PANEL
ALBUMIN: 2.5 g/dL — AB (ref 3.5–5.0)
ANION GAP: 9 (ref 5–15)
BUN: 9 mg/dL (ref 6–20)
CO2: 27 mmol/L (ref 22–32)
Calcium: 8.2 mg/dL — ABNORMAL LOW (ref 8.9–10.3)
Chloride: 100 mmol/L — ABNORMAL LOW (ref 101–111)
Creatinine, Ser: 1.16 mg/dL (ref 0.61–1.24)
GFR calc Af Amer: >60 mL/min (ref >60)
Glucose, Bld: 108 mg/dL — ABNORMAL HIGH (ref 65–99)
PHOSPHORUS: 3.1 mg/dL (ref 2.5–4.6)
Potassium: 3.7 mmol/L (ref 3.5–5.1)
Sodium: 136 mmol/L (ref 135–145)

## 2017-06-11 LAB — CBC
HEMATOCRIT: 37.9 % — AB (ref 39.0–52.0)
Hemoglobin: 12.2 g/dL — ABNORMAL LOW (ref 13.0–17.0)
MCH: 26.4 pg (ref 26.0–34.0)
MCHC: 32.2 g/dL (ref 30.0–36.0)
MCV: 82 fL (ref 78.0–100.0)
Platelets: 146 10*3/uL — ABNORMAL LOW (ref 150–400)
RBC: 4.62 MIL/uL (ref 4.22–5.81)
RDW: 14.6 % (ref 11.5–15.5)
WBC: 8.8 10*3/uL (ref 4.0–10.5)

## 2017-06-11 LAB — GLUCOSE, CAPILLARY
GLUCOSE-CAPILLARY: 96 mg/dL (ref 65–99)
GLUCOSE-CAPILLARY: 129 mg/dL — AB (ref 65–99)
Glucose-Capillary: 175 mg/dL — ABNORMAL HIGH (ref 65–99)
Glucose-Capillary: 190 mg/dL — ABNORMAL HIGH (ref 65–99)

## 2017-06-11 LAB — PROTIME-INR
INR: 2.14
INR: 1.86
PROTHROMBIN TIME: 23.8 s — AB (ref 11.4–15.2)
Prothrombin Time: 21.2 seconds — ABNORMAL HIGH (ref 11.4–15.2)

## 2017-06-11 LAB — T3: T3, Total: 80 ng/dL (ref 71–180)

## 2017-06-11 MED ORDER — APIXABAN 5 MG PO TABS
5.0000 mg | ORAL_TABLET | Freq: Two times a day (BID) | ORAL | Status: DC
  Administered 2017-06-11 – 2017-06-13 (×4): 5 mg via ORAL
  Filled 2017-06-11 (×4): qty 1

## 2017-06-11 MED ORDER — AMIODARONE HCL 200 MG PO TABS
200.0000 mg | ORAL_TABLET | Freq: Two times a day (BID) | ORAL | Status: DC
  Administered 2017-06-11 – 2017-06-14 (×7): 200 mg via ORAL
  Filled 2017-06-11 (×7): qty 1

## 2017-06-11 MED ORDER — LOSARTAN POTASSIUM 25 MG PO TABS
25.0000 mg | ORAL_TABLET | Freq: Every day | ORAL | Status: DC
  Administered 2017-06-11 – 2017-06-12 (×2): 25 mg via ORAL
  Filled 2017-06-11 (×2): qty 1

## 2017-06-11 MED ORDER — PERFLUTREN LIPID MICROSPHERE
1.0000 mL | INTRAVENOUS | Status: AC | PRN
  Administered 2017-06-11: 4 mL via INTRAVENOUS
  Filled 2017-06-11: qty 10

## 2017-06-11 MED ORDER — GI COCKTAIL ~~LOC~~: 30.0000 mL | Freq: Three times a day (TID) | ORAL | Status: DC | PRN

## 2017-06-11 MED ORDER — SUCRALFATE 1 GM/10ML PO SUSP
1.0000 g | Freq: Three times a day (TID) | ORAL | Status: DC
  Administered 2017-06-11 – 2017-06-14 (×9): 1 g via ORAL
  Filled 2017-06-11 (×12): qty 10

## 2017-06-11 MED ORDER — FUROSEMIDE 40 MG PO TABS
40.0000 mg | ORAL_TABLET | Freq: Two times a day (BID) | ORAL | Status: DC
  Administered 2017-06-11 – 2017-06-14 (×6): 40 mg via ORAL
  Filled 2017-06-11 (×6): qty 1

## 2017-06-11 NOTE — Progress Notes (Signed)
   Subjective: Terry Parks was seen sleeping in his chair this morning.  States that he still has some chest and abdominal discomfort.  He continues to appear lethargic.  Objective:  Vital signs in last 24 hours: Vitals:   06/09/17 2200 06/10/17 0400 06/10/17 1900 06/11/17 0425  BP: (!) 139/99 (!) 120/92 (!) 144/97 (!) 150/95  Pulse: (!) 103 94 92 88  Resp:   14 (!) 24  Temp:  98.5 F (36.9 C) 98.1 F (36.7 C) 97.8 F (36.6 C)  TempSrc:  Oral Oral Oral  SpO2: 95% 92%  100%  Weight:    296 lb 3.2 oz (134.4 kg)  Height:       Physical Exam  Constitutional: He appears well-developed and well-nourished. No distress.  HENT:  Head: Normocephalic and atraumatic.  Eyes: Conjunctivae are normal.  Cardiovascular: Normal rate, regular rhythm, normal heart sounds and intact distal pulses.  Respiratory: Effort normal. No respiratory distress. He has no wheezes.  GI: Soft. Bowel sounds are normal. He exhibits no distension. There is no tenderness.  Musculoskeletal: He exhibits edema.  Cool bilateral lower extremities   Neurological: He is alert. No cranial nerve deficit.  Skin: He is not diaphoretic. No erythema.  Psychiatric: He has a normal mood and affect. His behavior is normal. Judgment and thought content normal.    Assessment/Plan:  Acute on chronic combinedsystolic and diastolic heart failure Patient has net -3340mL over the past 24 hrs and weight today 1/26 is 296lbs from 301lbs yesterday. The patient's dry weight is approximately 290lbs.   Will continue to diurese patient on IV lasix as he appears euvolemic.   -continue IV Lasix 40mg  qd -continue toMonitor I/O -heart healthy/carb modified diet -started carvedilol 12.5mg  bid -started losartan 25mg   Atrial Fibrillation CHA2DS2-VASc=3.TEE DCCV done 1/23. Patient converted back to atrial flutter 2:1 yesterday 1/25. However, the patient was in sinus rhythm today.   -continue apixaban -Continue amiodarone 200mg   bid. The patient has received 2.2g of amiodarone thusfar.  -INR=2.14 today -continue cpap -request respiratory to educate patient on how to use cpap  Acute renal failuresecondary to post renal obstruction Presented with a creatinine of 6.2. His baseline is1-1.1. Cr today (1/26) is 1.16.  -continue coude cath for 4-6 weeks due to bladder distention-f/u urology outpatient  Diabetes mellitus type 2 The patient'sblood glucose has been ranging80-142. HgA1c=8.1  -continue Lantus 40units  -continueSSI -continue cymbalta for neuropathy  Essential Hypertension The patient's blood pressure over the past 24 hrs has been ranging 120-150/90-100s  -started losartan 25mg  qd  Constipation/ GI indigestion Continues to state that he has chest and upper GI discomfort.  The patient will have an outpatient GI consult once his cardiac problems have been resolved.  -GI cocktail -magnesium citrate bottle prescribed -continue protonix -senna prn -Continue Carafate  Dispo: Anticipated discharge in approximately 2-3 day(s).   Terry Mage, MD Internal Medicine PGY1 Pager:[phone] 06/11/2017, 6:48 AM

## 2017-06-11 NOTE — Progress Notes (Signed)
[facility] for Eliquis Indication: Atrial flutter/Atrial fib  Allergies  Allergen Reactions  . Lisinopril Cough    Patient Measurements: Height: 6\' 1"  (185.4 cm) Weight: 296 lb 3.2 oz (134.4 kg) IBW/kg (Calculated) : 79.9 Heparin Dosing Weight: 114kg  Vital Signs: Temp: 98.2 F (36.8 C) (01/26 1335) Temp Source: Oral (01/26 1335) BP: 165/97 (01/26 1335) Pulse Rate: 81 (01/26 1335)  Labs: Recent Labs    06/09/17 0354 06/09/17 0622 06/10/17 0427 06/10/17 1414 06/11/17 0422 06/11/17 1600  HGB 12.5*  --  11.9*  --  12.2*  --   HCT 38.9*  --  37.6*  --  37.9*  --   PLT 127*  --  128*  --  146*  --   LABPROT 16.3*  --   --  26.1* 23.8* 21.2*  INR 1.32  --   --  2.41 2.14 1.86  HEPARINUNFRC  --  0.52  --   --   --   --   CREATININE 1.72*  --  1.23  --  1.16  --     Estimated Creatinine Clearance: 98.6 mL/min (by C-G formula based on SCr of 1.16 mg/dL).  Assessment: 62 y.o. male with Afib initially started on heparin bridge to coumadin. INR 1.32 this morning. Now to switch to Eliquis. Noted pt received 10 mg coumadin this evening, INR likely will still be subtherapeutic after this dose.  hgb 11.9, pltc 128K stable.  Apixaban started 1/24 when INR = 1.3, but pt had two 10 mg doses of Coumadin prior to switching to apixaban and this has been on hold for INR > 2.0 -INR= 1.86  Goal of Therapy:  Monitor platelets by anticoagulation protocol: Yes   Plan:  -Resume apixiban 5mg  po bid tonight -Will follow patient progress  Hildred Laser, Pharm D 06/11/2017 4:42 PM

## 2017-06-11 NOTE — Progress Notes (Signed)
  Echocardiogram 2D Echocardiogram Limited with definity has been performed.  Darlina Sicilian M 06/11/2017, 11:35 AM

## 2017-06-11 NOTE — Progress Notes (Addendum)
Progress Note  Patient Name: Terry Parks Date of Encounter: 06/11/2017  Primary Cardiologist: [name], MD   Subjective   Denies any chest pain or SOB. Feels tired.  Back in NSR with PACs on IV Amio gtt  Inpatient Medications    Scheduled Meds: . carvedilol  12.5 mg Oral BID WC  . DULoxetine  60 mg Oral Daily  . furosemide  40 mg Intravenous Daily  . insulin aspart  0-9 Units Subcutaneous TID WC  . insulin glargine  40 Units Subcutaneous QHS  . losartan  25 mg Oral Daily  . pantoprazole  40 mg Oral Daily  . sodium chloride flush  10-40 mL Intracatheter Q12H  . sodium chloride flush  3 mL Intravenous Q12H  . sucralfate  1 g Oral TID WC & HS   Continuous Infusions: . sodium chloride Stopped (06/08/17 0915)  . amiodarone 30 mg/hr (06/11/17 0553)   PRN Meds: acetaminophen **OR** acetaminophen, gi cocktail, ondansetron, phenol, senna, sodium chloride flush, sodium chloride flush   Vital Signs    Vitals:   06/09/17 2200 06/10/17 0400 06/10/17 1900 06/11/17 0425  BP: (!) 139/99 (!) 120/92 (!) 144/97 (!) 150/95  Pulse: (!) 103 94 92 88  Resp:   14 (!) 24  Temp:  98.5 F (36.9 C) 98.1 F (36.7 C) 97.8 F (36.6 C)  TempSrc:  Oral Oral Oral  SpO2: 95% 92%  100%  Weight:    296 lb 3.2 oz (134.4 kg)  Height:        Intake/Output Summary (Last 24 hours) at 06/11/2017 1035 Last data filed at 06/11/2017 0800 Gross per 24 hour  Intake 1310.4 ml  Output 3800 ml  Net -2489.6 ml   Filed Weights   06/08/17 0850 06/09/17 1100 06/11/17 0425  Weight: (!) 307 lb 12.2 oz (139.6 kg) (!) 301 lb (136.5 kg) 296 lb 3.2 oz (134.4 kg)    Telemetry    NSR with PACs - Personally Reviewed  ECG    No new EKG to review- Personally Reviewed  Physical Exam   GEN: No acute distress.   Neck: No JVD Cardiac: RRR, no murmurs, rubs, or gallops.  Respiratory: Clear to auscultation bilaterally. GI: Soft, nontender, non-distended  MS: 1+ LE edema; No deformity. Neuro:   Nonfocal  Psych: Normal affect   Labs    Chemistry Recent Labs  Lab 06/06/17 1042 06/07/17 0258  06/09/17 0354 06/10/17 0427 06/11/17 0422  NA 132* 130*   < > 136 138 136  K 4.9 5.0   < > 3.7 3.4* 3.7  CL 94* 94*   < > 100* 100* 100*  CO2 21* 19*   < > 27 28 27   GLUCOSE 227* 208*   < > 105* 124* 108*  BUN 42* 56*   < > 33* 18 9  CREATININE 6.24* 6.90*   < > 1.72* 1.23 1.16  CALCIUM 8.7* 8.5*   < > 8.3* 8.2* 8.2*  PROT 6.5 6.4*  --   --   --   --   ALBUMIN 3.1* 2.8*   < > 2.4* 2.4* 2.5*  AST 29 24  --   --   --   --   ALT 16* 18  --   --   --   --   ALKPHOS 67 69  --   --   --   --   BILITOT 1.8* 1.4*  --   --   --   --   GFRNONAA 9*  8*   < > 42* >60 >60  GFRAA 10* 9*   < > 48* >60 >60  ANIONGAP 17* 17*   < > 9 10 9    < > = values in this interval not displayed.     Hematology Recent Labs  Lab 06/09/17 0354 06/10/17 0427 06/11/17 0422  WBC 9.2 8.4 8.8  RBC 4.79 4.59 4.62  HGB 12.5* 11.9* 12.2*  HCT 38.9* 37.6* 37.9*  MCV 81.2 81.9 82.0  MCH 26.1 25.9* 26.4  MCHC 32.1 31.6 32.2  RDW 14.6 14.5 14.6  PLT 127* 128* 146*    Cardiac Enzymes Recent Labs  Lab 06/06/17 2208 06/07/17 0258  TROPONINI 0.24* 0.23*    Recent Labs  Lab 06/06/17 1053  TROPIPOC 0.13*     BNP Recent Labs  Lab 06/06/17 1042  BNP 991.6*     DDimer No results for input(s): DDIMER in the last 168 hours.   Radiology    No results found.  Cardiac Studies   Echo: 06/08/17 - Left ventricle: The cavity size was normal. There was severe concentric hypertrophy. Images were inadequate for LV wall motion assessment. The study was not technically sufficient to allow evaluation of LV diastolic dysfunction due to atrial fibrillation. - Aortic valve: Trileaflet; mildly thickened, mildly calcified leaflets. - Mitral valve: There was trivial regurgitation. - Left atrium: The atrium was severely dilated. - Right ventricle: The cavity size was moderately dilated.  Wall thickness was normal. Systolic function was mildly to moderately reduced. - Right atrium: The atrium was moderately dilated. - Pulmonic valve: There was trivial regurgitation. - Impressions: Endocardial segments are not adequately visualized to comment on LVF or wall motion. recommend limited echo with definity contrast to determine accurate EF and wall motion.  Echo 06/16/15: - Left ventricle: The cavity size was normal. Wall thickness was increased in a pattern of mild LVH. Systolic function was mildly to moderately reduced. The estimated ejection fraction was in the range of 40% to 45%. Diffuse hypokinesis. Doppler parameters are consistent with abnormal left ventricular relaxation (grade 1 diastolic dysfunction). The E/e ratio is between 8-15, suggesting indeterminate LV filling pressure. - Left atrium: The atrium was normal in size. - Inferior vena cava: The vessel was normal in size. The respirophasic diameter changes were in the normal range (= 50%), consistent with normal central venous pressure.  Myoview 11/28/14:  The left ventricular ejection fraction is severely decreased (<30%).  Nuclear stress EF: 25%. Diaphragmatic attenuation with mild LV dilatation and severeal global HK C/W NISCM    Patient Profile     62 y.o. male with a hx of HTN cardiomyopathy, combined systolic and diastolic heart failure, HTN, HLD, DM, BPH, and cardiomegalywho is being seen for the evaluation of Afibat the request of Dr. Evette Doffing.  Assessment & Plan    1. A fib - DCCV on 1/23 am.  - Started on apixaban for CHA2DS2-VASc Score is 3 (CHF, HTN, DM) - A fib was thought to be brought on from acute illness or acute CHF. - reverted back to atrial flutter with RVR yesterday and started on IV Amio gtt and BB.  - now back in NSR with PACs - Continue carvedilol and change Amio to 200mg  BID PO.  - follow QTC on EKG - T3 and T4 are normal  2. Acute on chronic  systolic and diastolic HF, NICM, EF 35-32% with grade 1 DD (06/16/2015) - Echo this admission suboptimal - will repeat with definity contrast today - Volume improved.  Down 30 lbs this admission. Dry weight: ~295 lbs. This am: 296lbs - he put out 4.8L yesterday and is 15L net neg - Change Lasix to PO 40 mg BID.  - continue Carvedilol.   - BP elevated so restarting Losartan 25mg  daily   3. AKI r/t post renal obstruction requiring coude catherization - Continues to improve and creatinine now back to baseline  4. HTN - DBP still elevated. Had previously been hypotensive with A fib RVR and home amlodipine and imdur were held.  - Was on ARB prior to admission. Restarting Losartan 25mg  daily this am.  - Continue CPAP HS.  5. Epigastric pain and pain with swallowing - ?related to constipation and/or gastroparesis  - Per primary  6. Hypokalemia - 3.7 this am. Has not required supplements, but may need with continued diuresis now that his creatinine has normalized - Follow daily BMP  For questions or updates, please contact [place] Please consult [email or web address] for contact info under Cardiology/STEMI.      Signed, Fransico Him, MD  06/11/2017, 10:35 AM

## 2017-06-11 NOTE — Progress Notes (Deleted)
Patient walked for 360 feet and tolerated it well.

## 2017-06-12 LAB — GLUCOSE, CAPILLARY
GLUCOSE-CAPILLARY: 99 mg/dL (ref 65–99)
GLUCOSE-CAPILLARY: 165 mg/dL — AB (ref 65–99)
GLUCOSE-CAPILLARY: 137 mg/dL — AB (ref 65–99)
GLUCOSE-CAPILLARY: 191 mg/dL — AB (ref 65–99)

## 2017-06-12 LAB — URINALYSIS, ROUTINE W REFLEX MICROSCOPIC
Bilirubin Urine: NEGATIVE
Glucose, UA: NEGATIVE mg/dL
Ketones, ur: NEGATIVE mg/dL
NITRITE: NEGATIVE
PH: 7 (ref 5.0–8.0)
Protein, ur: 30 mg/dL — AB
SPECIFIC GRAVITY, URINE: 1.008 (ref 1.005–1.030)

## 2017-06-12 LAB — RENAL FUNCTION PANEL
Albumin: 2.4 g/dL — ABNORMAL LOW (ref 3.5–5.0)
Anion gap: 12 (ref 5–15)
BUN: 7 mg/dL (ref 6–20)
CALCIUM: 8.1 mg/dL — AB (ref 8.9–10.3)
CO2: 27 mmol/L (ref 22–32)
CREATININE: 1.16 mg/dL (ref 0.61–1.24)
Chloride: 99 mmol/L — ABNORMAL LOW (ref 101–111)
GFR calc Af Amer: >60 mL/min (ref >60)
GFR calc non Af Amer: >60 mL/min (ref >60)
GLUCOSE: 90 mg/dL (ref 65–99)
PHOSPHORUS: 3.2 mg/dL (ref 2.5–4.6)
Potassium: 3.5 mmol/L (ref 3.5–5.1)
SODIUM: 138 mmol/L (ref 135–145)

## 2017-06-12 LAB — CBC
HCT: 37.7 % — ABNORMAL LOW (ref 39.0–52.0)
Hemoglobin: 12.2 g/dL — ABNORMAL LOW (ref 13.0–17.0)
MCH: 26.3 pg (ref 26.0–34.0)
MCHC: 32.4 g/dL (ref 30.0–36.0)
MCV: 81.3 fL (ref 78.0–100.0)
PLATELETS: 179 10*3/uL (ref 150–400)
RBC: 4.64 MIL/uL (ref 4.22–5.81)
RDW: 14.5 % (ref 11.5–15.5)
WBC: 9.3 10*3/uL (ref 4.0–10.5)

## 2017-06-12 LAB — SODIUM, URINE, RANDOM: SODIUM UR: 42 mmol/L

## 2017-06-12 MED ORDER — LOSARTAN POTASSIUM 25 MG PO TABS
25.0000 mg | ORAL_TABLET | Freq: Once | ORAL | Status: AC
  Administered 2017-06-12: 25 mg via ORAL
  Filled 2017-06-12: qty 1

## 2017-06-12 MED ORDER — LOSARTAN POTASSIUM 50 MG PO TABS
50.0000 mg | ORAL_TABLET | Freq: Every day | ORAL | Status: DC
  Administered 2017-06-13 – 2017-06-14 (×2): 50 mg via ORAL
  Filled 2017-06-12 (×2): qty 1

## 2017-06-12 NOTE — Progress Notes (Signed)
Patient transferred to [facility] room 20. Report given to receiving nurse. Patient said he will update his sister with current room.

## 2017-06-12 NOTE — Progress Notes (Signed)
Pt is experiencing mild pain in right leg gave PRN tylenol, Also asking  for multiple snacks, BS is 191 gave 40mg  of lantus as scheduled and Educated on Carb control. Patient understands and need teaching on Carb counting. Recommend Diabetes consult.

## 2017-06-12 NOTE — Progress Notes (Signed)
MD notified about elevated BP.

## 2017-06-12 NOTE — Progress Notes (Signed)
Patient had 10 irregular beats on heart monitor- labeled SVT.

## 2017-06-12 NOTE — Progress Notes (Signed)
Patient transferred form 4E18, came with RN and NT. Patient ambulated to unit bed and transferred to chair with one assist. No complaints of pain. Patient updated on plan of care, safety precautions in place.

## 2017-06-12 NOTE — Plan of Care (Signed)
  Health Behavior/Discharge Planning: Ability to manage health-related needs will improve 06/12/2017 0248 - Progressing by Tristan Schroeder, RN   Education: Knowledge of General Education information will improve 06/12/2017 0248 - Progressing by Tristan Schroeder, RN

## 2017-06-12 NOTE — Progress Notes (Signed)
RT has placed pt on cpap for the night on autopap settings. Pt is not in any distress and saturating 100% at this time. Pt was educated on how to take off mask and turn machine off if need be. RT will come back for another assessment at later time during shift to monitor patient.

## 2017-06-12 NOTE — Progress Notes (Addendum)
   Subjective: Terry Parks was seen and evaluated at bedside. Breathing more comfortably today. Denies any pain.  Objective:  Vital signs in last 24 hours: Vitals:   06/12/17 0000 06/12/17 0216 06/12/17 0233 06/12/17 0613  BP:  (!) 161/95 (!) 161/98 (!) 153/99  Pulse:  91  86  Resp: (!) 22 20    Temp:  98.1 F (36.7 C)  98.5 F (36.9 C)  TempSrc:  Oral  Oral  SpO2:  94%  100%  Weight:  293 lb 4.8 oz (133 kg)    Height:  6\' 1"  (1.854 m)     General: Alert, in no acute distress but tired.  HEENT: No icterus, injection or ptosis. No hoarseness or dysarthria  Cardiac: RRR, no murmur Pulmonary: CTA BL with normal WOB on RA Abd: Soft, non-tender. +bs Extremities: Warm, perfused. Minimal LE edema.  Assessment/Plan: Acute on chronic combinedsystolic and diastolic heart failure Terry Parks continues to have excellent output w/ net 3.5 L recorded UOP over past 24 hours and is net 18.7 L out since admission. Weight recorded this morning 293, down at least 30 lbs so far this admission. His dry weight seems to be ~295. He was transitioned to PO Lasix 40mg  BID yesterday evening. His renal function and lytes remain stable and will continue to monitor. Repeat ECHO was ordered yesterday as initial ECHO obtained this admission was suboptimal. Repeat ECHO with LVEF 30-25% with severe diffuse hypokinesis. Cardiology is assisting with management of this patient and we appreciate their recommendations.  -Continue PO Lasix 40mg  BID  -Increase to Losartan 50mg  -Continue Coreg 12.5mg  BID -HH/Carb mod diet with fluid restriction to 2 L.  New Atrial Fibrillation CHA2DS2-VASc=3.TEE DCCV done 1/23 however converted back to Aflutter with RVR. He was placed back on IV Amiodarone with conversion to NSR. He remains in NSR today while on Amiodarone 200mg  BID. He remains on Eliquis for anticoagulation.   Acute renal failuresecondary to post renal obstruction, BPH Presented with a creatinine of 6.2 and has  been back to his baseline of 1.1 for the past several days. We are continuing his coude cath x4-6 weeks and he will follow-up with urology outpatient.   Diabetes mellitus type 2 The patient'sblood glucose has been ranging96-192. He has not required much of his SSI on current regimen. HgA1c=8.1 -continue Lantus 40units  -continueSSI -continue cymbalta for neuropathy  Essential Hypertension He's been hypertensive over the past 24 hours and his Losartan 25mg  was resumed yesterday. BP most recently 153/99 and will work towards better BP control today.  Constipation/ GI indigestion Continues to complain of dyspepsia and some GI discomfort with swallowing, but this is improved with Carafate. No thrush appreciated in oral cavity. He has been tolerating PO intake and suspect symptoms will continue to improve. He could be evaluated outpatient by GI if persistent.  -Continue Carafate, Protonix -GI cocktail prn, has not required any  Dispo: Anticipated discharge in approximately 1-2 days. Will consult PT/OT for evaluation as we approach discharge planning.   Einar Gip, DO Internal Medicine PGY2 KZLDJ:[phone] 06/12/2017, 7:42 AM

## 2017-06-12 NOTE — Progress Notes (Signed)
Progress Note  Patient Name: Terry Parks Date of Encounter: 06/12/2017  Primary Cardiologist: [name], MD   Subjective   No complaints this AM  Inpatient Medications    Scheduled Meds: . amiodarone  200 mg Oral BID  . apixaban  5 mg Oral BID  . carvedilol  12.5 mg Oral BID WC  . DULoxetine  60 mg Oral Daily  . furosemide  40 mg Oral BID  . insulin aspart  0-9 Units Subcutaneous TID WC  . insulin glargine  40 Units Subcutaneous QHS  . losartan  25 mg Oral Daily  . pantoprazole  40 mg Oral Daily  . sodium chloride flush  10-40 mL Intracatheter Q12H  . sodium chloride flush  3 mL Intravenous Q12H  . sucralfate  1 g Oral TID WC & HS   Continuous Infusions: . sodium chloride Stopped (06/08/17 0915)   PRN Meds: acetaminophen **OR** acetaminophen, gi cocktail, ondansetron, phenol, senna, sodium chloride flush, sodium chloride flush   Vital Signs    Vitals:   06/12/17 0216 06/12/17 0233 06/12/17 0613 06/12/17 0825  BP: (!) 161/95 (!) 161/98 (!) 153/99 (!) 155/93  Pulse: 91  86 90  Resp: 20     Temp: 98.1 F (36.7 C)  98.5 F (36.9 C)   TempSrc: Oral  Oral   SpO2: 94%  100%   Weight: 293 lb 4.8 oz (133 kg)     Height: 6\' 1"  (1.854 m)       Intake/Output Summary (Last 24 hours) at 06/12/2017 0922 Last data filed at 06/12/2017 2376 Gross per 24 hour  Intake 908.55 ml  Output 4485 ml  Net -3576.45 ml   Filed Weights   06/09/17 1100 06/11/17 0425 06/12/17 0216  Weight: (!) 301 lb (136.5 kg) 296 lb 3.2 oz (134.4 kg) 293 lb 4.8 oz (133 kg)    Telemetry    SR - Personally Reviewed  ECG    n/a  Physical Exam   GEN: No acute distress.   Neck: No JVD Cardiac: RRR, no murmurs, rubs, or gallops.  Respiratory: Clear to auscultation bilaterally. GI: Soft, nontender, non-distended  MS: 1-2+ bilateral edema; No deformity. Neuro:  Nonfocal  Psych: Normal affect   Labs    Chemistry Recent Labs  Lab 06/06/17 1042 06/07/17 0258  06/10/17 0427  06/11/17 0422 06/12/17 0410  NA 132* 130*   < > 138 136 138  K 4.9 5.0   < > 3.4* 3.7 3.5  CL 94* 94*   < > 100* 100* 99*  CO2 21* 19*   < > 28 27 27   GLUCOSE 227* 208*   < > 124* 108* 90  BUN 42* 56*   < > 18 9 7   CREATININE 6.24* 6.90*   < > 1.23 1.16 1.16  CALCIUM 8.7* 8.5*   < > 8.2* 8.2* 8.1*  PROT 6.5 6.4*  --   --   --   --   ALBUMIN 3.1* 2.8*   < > 2.4* 2.5* 2.4*  AST 29 24  --   --   --   --   ALT 16* 18  --   --   --   --   ALKPHOS 67 69  --   --   --   --   BILITOT 1.8* 1.4*  --   --   --   --   GFRNONAA 9* 8*   < > >60 >60 >60  GFRAA 10* 9*   < > >  60 >60 >60  ANIONGAP 17* 17*   < > 10 9 12    < > = values in this interval not displayed.     Hematology Recent Labs  Lab 06/10/17 0427 06/11/17 0422 06/12/17 0410  WBC 8.4 8.8 9.3  RBC 4.59 4.62 4.64  HGB 11.9* 12.2* 12.2*  HCT 37.6* 37.9* 37.7*  MCV 81.9 82.0 81.3  MCH 25.9* 26.4 26.3  MCHC 31.6 32.2 32.4  RDW 14.5 14.6 14.5  PLT 128* 146* 179    Cardiac Enzymes Recent Labs  Lab 06/06/17 2208 06/07/17 0258  TROPONINI 0.24* 0.23*    Recent Labs  Lab 06/06/17 1053  TROPIPOC 0.13*     BNP Recent Labs  Lab 06/06/17 1042  BNP 991.6*     DDimer No results for input(s): DDIMER in the last 168 hours.   Radiology    No results found.  Cardiac Studies    Patient Profile  62 y.o. male with a hx of HTN cardiomyopathy, combined systolic and diastolic heart failure, HTN, HLD, DM, BPH, and cardiomegalywho is being seen for the evaluation of Afibat the request of Dr. Evette Doffing.    Assessment & Plan    1. Afib - patient s/p DCCV Jan 23. Reverted back to aflutter with RVR, was started on amio gtt - converted back to SR on amio, now on oral 200mg  bid along with coreg 12.5mg  bid. - eliquis for stroke prevention  2. Acute on chronic systolic HF - echo Jan 91YN limited visualization, repeat with contrast shows LVEF 30-35%, reduced RV function - prior echo Jan 2017 LVEF 40-45%. Nuclear stress 2016 no  ischemia.  - drop in LVEF probably arrhythmia related - dramatic diuresis this admission, negative 18.7 liters. Changed to oral lasix yesterday 40mg  bid evening (received IV dose in AM). Follow I/Os on oral lasix today. Current renal functoin stable - medical therapy with coreg 12.5mg  bid, losartan 25mg  daily. Increase losartan to 50mg  daily  3. AKI r/t post renal obstruction requiring coude catherization - Continues to improve and creatinine now back to baseline  4. HTN - titrate ARB and beta blocker as needed in setting of systolic dysfunction.      For questions or updates, please contact [place] Please consult [email or web address] for contact info under Cardiology/STEMI.      Merrily Pew, MD  06/12/2017, 9:22 AM

## 2017-06-13 LAB — CBC
HCT: 36 % — ABNORMAL LOW (ref 39.0–52.0)
Hemoglobin: 11.6 g/dL — ABNORMAL LOW (ref 13.0–17.0)
MCH: 26.4 pg (ref 26.0–34.0)
MCHC: 32.2 g/dL (ref 30.0–36.0)
MCV: 82 fL (ref 78.0–100.0)
PLATELETS: 204 10*3/uL (ref 150–400)
RBC: 4.39 MIL/uL (ref 4.22–5.81)
RDW: 14.4 % (ref 11.5–15.5)
WBC: 9 10*3/uL (ref 4.0–10.5)

## 2017-06-13 LAB — RENAL FUNCTION PANEL
ALBUMIN: 2.4 g/dL — AB (ref 3.5–5.0)
Anion gap: 10 (ref 5–15)
BUN: 9 mg/dL (ref 6–20)
CALCIUM: 8.1 mg/dL — AB (ref 8.9–10.3)
CO2: 27 mmol/L (ref 22–32)
CREATININE: 1.21 mg/dL (ref 0.61–1.24)
Chloride: 101 mmol/L (ref 101–111)
Glucose, Bld: 140 mg/dL — ABNORMAL HIGH (ref 65–99)
PHOSPHORUS: 3.2 mg/dL (ref 2.5–4.6)
Potassium: 3.5 mmol/L (ref 3.5–5.1)
Sodium: 138 mmol/L (ref 135–145)

## 2017-06-13 LAB — GLUCOSE, CAPILLARY
GLUCOSE-CAPILLARY: 176 mg/dL — AB (ref 65–99)
Glucose-Capillary: 130 mg/dL — ABNORMAL HIGH (ref 65–99)
Glucose-Capillary: 157 mg/dL — ABNORMAL HIGH (ref 65–99)
Glucose-Capillary: 199 mg/dL — ABNORMAL HIGH (ref 65–99)

## 2017-06-13 MED ORDER — RIVAROXABAN 20 MG PO TABS
20.0000 mg | ORAL_TABLET | Freq: Every day | ORAL | Status: DC
  Administered 2017-06-13: 20 mg via ORAL
  Filled 2017-06-13: qty 1

## 2017-06-13 NOTE — Progress Notes (Signed)
Progress Note  Patient Name: Terry Parks Date of Encounter: 06/13/2017  Primary Cardiologist: [name], MD   Subjective   States feels much better, swelling is better. Walked in the hall without shortness of breath, although his feet hurt.   Inpatient Medications    Scheduled Meds: . amiodarone  200 mg Oral BID  . carvedilol  12.5 mg Oral BID WC  . DULoxetine  60 mg Oral Daily  . furosemide  40 mg Oral BID  . insulin aspart  0-9 Units Subcutaneous TID WC  . insulin glargine  40 Units Subcutaneous QHS  . losartan  50 mg Oral Daily  . pantoprazole  40 mg Oral Daily  . rivaroxaban  20 mg Oral Q supper  . sodium chloride flush  10-40 mL Intracatheter Q12H  . sodium chloride flush  3 mL Intravenous Q12H  . sucralfate  1 g Oral TID WC & HS   Continuous Infusions: . sodium chloride Stopped (06/08/17 0915)   PRN Meds: acetaminophen **OR** acetaminophen, gi cocktail, ondansetron, phenol, senna, sodium chloride flush, sodium chloride flush   Vital Signs    Vitals:   06/12/17 1725 06/12/17 2018 06/13/17 0519 06/13/17 0929  BP: (!) 156/90 (!) 146/93 130/82 (!) 149/93  Pulse: 90 91 80 88  Resp:  20 20   Temp:  97.7 F (36.5 C) 98.6 F (37 C)   TempSrc:  Oral Oral   SpO2:  97% 100% 100%  Weight:   291 lb 8 oz (132.2 kg)   Height:        Intake/Output Summary (Last 24 hours) at 06/13/2017 1131 Last data filed at 06/13/2017 0530 Gross per 24 hour  Intake 1850 ml  Output 2300 ml  Net -450 ml   Filed Weights   06/11/17 0425 06/12/17 0216 06/13/17 0519  Weight: 296 lb 3.2 oz (134.4 kg) 293 lb 4.8 oz (133 kg) 291 lb 8 oz (132.2 kg)    Telemetry    Sinus rhyhtm in the 80's with occ PAC's and Pvcs - Personally Reviewed  ECG    No new tracings - Personally Reviewed  Physical Exam   GEN: Obese male, No acute distress.   Neck: No JVD Cardiac: RRR, no murmurs, rubs, or gallops.  Respiratory: Clear to auscultation bilaterally. GI: Soft, nontender,  non-distended  MS: Trace edema; No deformity. Neuro:  Nonfocal  Psych: Normal affect   Labs    Chemistry Recent Labs  Lab 06/07/17 0258  06/11/17 0422 06/12/17 0410 06/13/17 0337  NA 130*   < > 136 138 138  K 5.0   < > 3.7 3.5 3.5  CL 94*   < > 100* 99* 101  CO2 19*   < > 27 27 27   GLUCOSE 208*   < > 108* 90 140*  BUN 56*   < > 9 7 9   CREATININE 6.90*   < > 1.16 1.16 1.21  CALCIUM 8.5*   < > 8.2* 8.1* 8.1*  PROT 6.4*  --   --   --   --   ALBUMIN 2.8*   < > 2.5* 2.4* 2.4*  AST 24  --   --   --   --   ALT 18  --   --   --   --   ALKPHOS 69  --   --   --   --   BILITOT 1.4*  --   --   --   --   GFRNONAA 8*   < > >  60 >60 >60  GFRAA 9*   < > >60 >60 >60  ANIONGAP 17*   < > 9 12 10    < > = values in this interval not displayed.     Hematology Recent Labs  Lab 06/11/17 0422 06/12/17 0410 06/13/17 0337  WBC 8.8 9.3 9.0  RBC 4.62 4.64 4.39  HGB 12.2* 12.2* 11.6*  HCT 37.9* 37.7* 36.0*  MCV 82.0 81.3 82.0  MCH 26.4 26.3 26.4  MCHC 32.2 32.4 32.2  RDW 14.6 14.5 14.4  PLT 146* 179 204    Cardiac Enzymes Recent Labs  Lab 06/06/17 2208 06/07/17 0258  TROPONINI 0.24* 0.23*   No results for input(s): TROPIPOC in the last 168 hours.   BNPNo results for input(s): BNP, PROBNP in the last 168 hours.   DDimer No results for input(s): DDIMER in the last 168 hours.   Radiology    No results found.  Cardiac Studies   Complete ECHO 06/07/2017 Study Conclusions - Left ventricle: The cavity size was normal. There was severe   concentric hypertrophy. Images were inadequate for LV wall motion   assessment. The study was not technically sufficient to allow   evaluation of LV diastolic dysfunction due to atrial   fibrillation. - Aortic valve: Trileaflet; mildly thickened, mildly calcified   leaflets. - Mitral valve: There was trivial regurgitation. - Left atrium: The atrium was severely dilated. - Right ventricle: The cavity size was moderately dilated. Wall    thickness was normal. Systolic function was mildly to moderately   reduced. - Right atrium: The atrium was moderately dilated. - Pulmonic valve: There was trivial regurgitation. - Impressions: Endocardial segments are not adequately visualized   to comment on LVF or wall motion. recommend limited echo with   definity contrast to determine accurate EF and wall motion.  Impressions: - Endocardial segments are not adequately visualized to comment on   LVF or wall motion. recommend limited echo with definity contrast   to determine accurate EF and wall motion.  Limited ECHO 06/11/2017 Study Conclusions - Left ventricle: The cavity size was normal. There was moderate   concentric hypertrophy. Systolic function was moderately to   severely reduced. The estimated ejection fraction was in the   range of 30% to 35%. Severe diffuse hypokinesis with no   identifiable regional variations. The study is not technically   sufficient to allow evaluation of LV diastolic function. Acoustic   contrast opacification revealed no evidence ofthrombus. - Left atrium: The atrium was severely dilated. - Right ventricle: Systolic function was reduced.  Impressions: - Limited study, no Doppler imaging performed.   Patient Profile     62 y.o. male with a hx of HTN cardiomyopathy, combined systolic and diastolic heart failure, HTN, HLD, DM, BPH, and cardiomegalywho is being seen for the evaluation of Afib  Assessment & Plan    1. Atrial fibrillation -S/P DCCV 06/08/17. Reverted back to aflutter with RVR, started on amiodarone drip.  -Converted back to sinus rhythm on amio, now on amiodarone 200 mg BID along with coreg 12.5 mg BID. -Maintaining sinus rhythm. -CHA2DS2/VAS Stroke Risk Score 3 (CHF, HTN, DM). On Eliquis for stroke risk reduction.   2. Acute on chronic systolic heart failure -echo Jan 22nd limited visualization, repeat with contrast shows LVEF 30-35%, reduced RV function - prior echo Jan  2017 LVEF 40-45%. Nuclear stress 2016 no ischemia.  - drop in LVEF probably arrhythmia related -Has had impressive diuresis this admission, Net negative 19.7L. 3.1L UOP in  last 24h. Wts erratic but down significantly 310+>>291. (wt was 288 in office in June)  -Now on oral lasix 40 mg BID -SCr stable.  -Continue losartan- increased yesterday, carvedilol  3. AKI r/t to post renal obtruction requiring coude cath -SCr up to 2.92 on 1/23. Back down and stable today at 1.21  4. Hypertension -On losartan and carvedilol. Losartan increased yesterday to 50 mg. -BP still elevated. Monitor response to increased losartan. Increase ARB and BB as needed.    5. OSA -Uses CPAP. Using hospital CPAP. Says he needs his machine looked at.     For questions or updates, please contact [place] Please consult [email or web address] for contact info under Cardiology/STEMI.      Signed, Daune Perch, NP  06/13/2017, 11:31 AM    I have seen and examined the patient along with Daune Perch, NP.  I have reviewed the chart, notes and new data.  I agree with NP's note.  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 Marked recovery of renal function.  PLAN: ARB dose just increased. On carvedilol and PO loop diuretics. Eliquis. Will soon be ready for DC from Cardiology point of view. Reassess LVEF after 2-3 months of good rate/rhythm control.   [name], MD, [place] [phone] 06/13/2017, 12:42 PM

## 2017-06-13 NOTE — Evaluation (Signed)
Occupational Therapy Evaluation Patient Details Name: Terry Parks MRN: [id number] DOB: 09/15/1955 Today's Date: 06/13/2017    History of Present Illness Pt is a 62 y.o. male admitted with acute on chronic combined systolic and diastolic heart failure and new atrial fibrillation. Pt with PMH significant for BPH, diabetes mellitus, cardiomegaly, headache, hyperlipidemia, hypertension, hypertensive cardiopathy, obstructive sleep apnea, and renal calculus.    Clinical Impression   PTA, pt reports independence with ADL and functional mobility; however, he does report limited activity tolerance for climbing the stairs to reach his bathroom for bathing at home. Pt currently requires min guard assist for stability with all functional mobility, LB ADL, and standing ADL tasks. Pt would benefit from continued OT services to improve independence and safety with ADL and functional mobility prior to returning home. Recommend home health OT follow-up post-acute D/C to maximize safety and independence in his home environment. OT will continue to follow while admitted.    Follow Up Recommendations  Home health OT;Supervision - Intermittent    Equipment Recommendations  3 in 1 bedside commode    Recommendations for Other Services       Precautions / Restrictions Precautions Precautions: Fall Restrictions Weight Bearing Restrictions: No      Mobility Bed Mobility Overal bed mobility: Needs Assistance Bed Mobility: Supine to Sit;Sit to Supine     Supine to sit: Supervision Sit to supine: Supervision   General bed mobility comments: Supervision for safety.   Transfers Overall transfer level: Needs assistance Equipment used: None Transfers: Sit to/from Stand Sit to Stand: Min guard         General transfer comment: Min guard assist for stability and encouragement to move slowly.     Balance Overall balance assessment: Needs assistance Sitting-balance support: No upper extremity  supported;Feet supported Sitting balance-Leahy Scale: Fair     Standing balance support: No upper extremity supported;During functional activity;Single extremity supported Standing balance-Leahy Scale: Fair Standing balance comment: Min guard assist for stability.                            ADL either performed or assessed with clinical judgement   ADL Overall ADL's : Needs assistance/impaired Eating/Feeding: Set up;Sitting   Grooming: Min guard;Standing   Upper Body Bathing: Set up;Sitting   Lower Body Bathing: Min guard;Sit to/from stand   Upper Body Dressing : Set up;Sitting   Lower Body Dressing: Min guard;Sit to/from stand   Toilet Transfer: Min guard;Ambulation   Toileting- Clothing Manipulation and Hygiene: Min guard;Sit to/from stand       Functional mobility during ADLs: Min guard General ADL Comments: Min guard assist on his feet due to instability and neuropathy. Decreased activity tolerance for ADL but no dyspnea noted.      Vision Baseline Vision/History: Wears glasses Wears Glasses: Reading only(but needs them for all the time) Patient Visual Report: No change from baseline Vision Assessment?: No apparent visual deficits     Perception     Praxis      Pertinent Vitals/Pain Pain Assessment: 0-10 Pain Score: 2  Pain Location: stomach Pain Descriptors / Indicators: Aching Pain Intervention(s): Monitored during session     Hand Dominance Right   Extremity/Trunk Assessment Upper Extremity Assessment Upper Extremity Assessment: Generalized weakness   Lower Extremity Assessment Lower Extremity Assessment: Defer to PT evaluation(B feet neuropathy)       Communication Communication Communication: No difficulties   Cognition Arousal/Alertness: Awake/alert Behavior During Therapy: WFL for tasks assessed/performed  Overall Cognitive Status: Within Functional Limits for tasks assessed                                      General Comments  Pt very frustrated with his breakfast.     Exercises     Shoulder Instructions      Home Living Family/patient expects to be discharged to:: Private residence Living Arrangements: Alone Available Help at Discharge: Friend(s) Type of Home: Apartment Home Access: Stairs to enter [employer] of Steps: flight Entrance Stairs-Rails: Right;Left;Can reach both Home Layout: Two level;1/2 bath on main level(has been staying on main level except bathing) Alternate Level Stairs-Number of Steps: flight Alternate Level Stairs-Rails: Right;Left;Can reach both Bathroom Shower/Tub: Teacher, early years/pre: Standard     Home Equipment: None          Prior Functioning/Environment Level of Independence: Independent        Comments: Drives        OT Problem List: Decreased strength;Decreased activity tolerance;Impaired balance (sitting and/or standing);Decreased safety awareness;Decreased knowledge of use of DME or AE;Decreased knowledge of precautions;Pain      OT Treatment/Interventions: Self-care/ADL training;Therapeutic exercise;Energy conservation;DME and/or AE instruction;Therapeutic activities;Patient/family education;Balance training;Cognitive remediation/compensation    OT Goals(Current goals can be found in the care plan section) Acute Rehab OT Goals Patient Stated Goal: to go home and get stronger OT Goal Formulation: With patient Time For Goal Achievement: 06/27/17 Potential to Achieve Goals: Good ADL Goals Pt Will Perform Grooming: with modified independence;standing Pt Will Perform Lower Body Dressing: with modified independence;with adaptive equipment;sit to/from stand Pt Will Transfer to Toilet: with modified independence;ambulating;bedside commode(BSC over toilet) Pt Will Perform Toileting - Clothing Manipulation and hygiene: with modified independence;sit to/from stand Pt Will Perform Tub/Shower Transfer: with modified  independence;3 in 1;Tub transfer;rolling walker  OT Frequency: Min 2X/week   Barriers to D/C:            Co-evaluation              AM-PAC PT "6 Clicks" Daily Activity     Outcome Measure Help from another person eating meals?: A Little Help from another person taking care of personal grooming?: A Little Help from another person toileting, which includes using toliet, bedpan, or urinal?: A Little Help from another person bathing (including washing, rinsing, drying)?: A Little Help from another person to put on and taking off regular upper body clothing?: A Little Help from another person to put on and taking off regular lower body clothing?: A Little 6 Click Score: 18   End of Session Equipment Utilized During Treatment: Gait belt Nurse Communication: Mobility status(pt requesting different/more food)  Activity Tolerance: Patient tolerated treatment well Patient left: in bed;with call bell/phone within reach  OT Visit Diagnosis: Other abnormalities of gait and mobility (R26.89);Muscle weakness (generalized) (M62.81)                Time: 6962-9528 OT Time Calculation (min): 20 min Charges:  OT General Charges $OT Visit: 1 Visit OT Evaluation $OT Eval Moderate Complexity: 1 Mod G-Codes:     Norman Herrlich, MS OTR/L  Pager: [place] A Jenni Thew 06/13/2017, 10:06 AM

## 2017-06-13 NOTE — Evaluation (Signed)
Physical Therapy Evaluation Patient Details Name: Terry Parks MRN: [id number] DOB: 12/31/1955 Today's Date: 06/13/2017   History of Present Illness  Pt is a 62 y.o. male admitted with acute on chronic combined systolic and diastolic heart failure and new atrial fibrillation. Pt with PMH significant for BPH, diabetes mellitus, cardiomegaly, headache, hyperlipidemia, hypertension, hypertensive cardiopathy, obstructive sleep apnea, and renal calculus.   Clinical Impression  Pt admitted secondary to problem above with deficits below. Pt tolerated gait training well, however, did complain about his neuropathy in bilat feet. Increased steadiness noted during gait with RW and required min guard to supervision. Pt lives alone, however, reports friends can check on him. Will continue to follow acutely to maximize functional mobility independence and safety.     Follow Up Recommendations Home health PT;Supervision for mobility/OOB    Equipment Recommendations  Rolling walker with 5" wheels    Recommendations for Other Services       Precautions / Restrictions Precautions Precautions: Fall Restrictions Weight Bearing Restrictions: No      Mobility  Bed Mobility               General bed mobility comments: Sitting EOB upon entry   Transfers Overall transfer level: Needs assistance Equipment used: None Transfers: Sit to/from Stand Sit to Stand: Min guard         General transfer comment: Min guard for stability. Increased time required to perform.   Ambulation/Gait Ambulation/Gait assistance: Min assist;Min guard;Supervision Ambulation Distance (Feet): 100 Feet Assistive device: None;Rolling walker (2 wheeled) Gait Pattern/deviations: Step-through pattern;Decreased stride length;Trunk flexed;Wide base of support Gait velocity: Decreased  Gait velocity interpretation: Below normal speed for age/gender General Gait Details: Slow, unsteady gait without AD requiring min to  min guard assist. With use of RW increased stability and only requiring min guard to supervision. Verbal cues for step height, upright posture and proximity to device.   Stairs            Wheelchair Mobility    Modified Rankin (Stroke Patients Only)       Balance Overall balance assessment: Needs assistance Sitting-balance support: No upper extremity supported;Feet supported Sitting balance-Leahy Scale: Fair     Standing balance support: No upper extremity supported;During functional activity;Bilateral upper extremity supported Standing balance-Leahy Scale: Fair Standing balance comment: Able to maintain static standing without UE support.                              Pertinent Vitals/Pain Pain Assessment: No/denies pain    Home Living Family/patient expects to be discharged to:: Private residence Living Arrangements: Alone Available Help at Discharge: Friend(s);Available PRN/intermittently Type of Home: Apartment Home Access: Stairs to enter Entrance Stairs-Rails: Right;Left;Can reach both Entrance Stairs-Number of Steps: 2 flights  Home Layout: Two level;1/2 bath on main level(has been staying on main level except bathing ) Home Equipment: None      Prior Function Level of Independence: Independent         Comments: Drives     Hand Dominance   Dominant Hand: Right    Extremity/Trunk Assessment   Upper Extremity Assessment Upper Extremity Assessment: Defer to OT evaluation    Lower Extremity Assessment Lower Extremity Assessment: Generalized weakness;LLE deficits/detail;RLE deficits/detail RLE Deficits / Details: Reports he cannot feel his feet secondary to peripheral neuropathy RLE Sensation: history of peripheral neuropathy LLE Deficits / Details: Reports he cannot feel his feet secondary to peripheral neuropathy LLE Sensation: history of peripheral  neuropathy    Cervical / Trunk Assessment Cervical / Trunk Assessment: Normal   Communication   Communication: No difficulties  Cognition Arousal/Alertness: Awake/alert Behavior During Therapy: WFL for tasks assessed/performed Overall Cognitive Status: Within Functional Limits for tasks assessed                                        General Comments General comments (skin integrity, edema, etc.): Educated about neuropathy precautions and importance of foot wear and foot checks.     Exercises     Assessment/Plan    PT Assessment Patient needs continued PT services  PT Problem List Decreased balance;Impaired sensation;Decreased knowledge of use of DME;Decreased mobility;Decreased strength       PT Treatment Interventions DME instruction;Gait training;Stair training;Functional mobility training;Balance training;Therapeutic exercise;Therapeutic activities;Neuromuscular re-education;Patient/family education    PT Goals (Current goals can be found in the Care Plan section)  Acute Rehab PT Goals Patient Stated Goal: to go home and get stronger PT Goal Formulation: With patient Time For Goal Achievement: 06/27/17 Potential to Achieve Goals: Good    Frequency Min 3X/week   Barriers to discharge Decreased caregiver support Lives alone, but family able to assist     Co-evaluation               AM-PAC PT "6 Clicks" Daily Activity  Outcome Measure Difficulty turning over in bed (including adjusting bedclothes, sheets and blankets)?: A Little Difficulty moving from lying on back to sitting on the side of the bed? : A Little Difficulty sitting down on and standing up from a chair with arms (e.g., wheelchair, bedside commode, etc,.)?: Unable Help needed moving to and from a bed to chair (including a wheelchair)?: A Little Help needed walking in hospital room?: A Little Help needed climbing 3-5 steps with a railing? : A Lot 6 Click Score: 15    End of Session Equipment Utilized During Treatment: Gait belt Activity Tolerance: Patient  tolerated treatment well Patient left: in bed;with call bell/phone within reach Nurse Communication: Mobility status PT Visit Diagnosis: Other abnormalities of gait and mobility (R26.89);Unsteadiness on feet (R26.81)    Time: 1540-0867 PT Time Calculation (min) (ACUTE ONLY): 23 min   Charges:   PT Evaluation $PT Eval Low Complexity: 1 Low PT Treatments $Gait Training: 8-22 mins   PT G Codes:        Leighton Ruff, PT, DPT  Acute Rehabilitation Services  Pager: [phone]   Rudean Hitt 06/13/2017, 1:08 PM

## 2017-06-13 NOTE — Progress Notes (Addendum)
   Subjective: [name] was seen sitting up in his bed this morning. He stated that he was doing well this morning. He is breathing well and he feels that he has less pain with swallowing.   Objective:  Vital signs in last 24 hours: Vitals:   06/12/17 1500 06/12/17 1725 06/12/17 2018 06/13/17 0519  BP: (!) 142/86 (!) 156/90 (!) 146/93 130/82  Pulse: 86 90 91 80  Resp: 20  20 20   Temp: 98.6 F (37 C)  97.7 F (36.5 C) 98.6 F (37 C)  TempSrc: Oral  Oral Oral  SpO2: 100%  97% 100%  Weight:    291 lb 8 oz (132.2 kg)  Height:       Physical Exam  Constitutional: He appears well-developed and well-nourished. No distress.  HENT:  Head: Normocephalic and atraumatic.  Eyes: Conjunctivae are normal.  Cardiovascular: Normal rate, regular rhythm, normal heart sounds and intact distal pulses.  Respiratory: Breath sounds normal. No respiratory distress. He has no wheezes.  GI: Soft. Bowel sounds are normal. He exhibits no distension. There is no tenderness.  Musculoskeletal: He exhibits no edema.  Neurological: He is alert. No cranial nerve deficit.  Skin: He is not diaphoretic. No erythema.  Psychiatric: He has a normal mood and affect. His behavior is normal. Judgment and thought content normal.   Assessment/Plan:  Acute on chronic combinedsystolic and diastolic heart failure The patient put out 1L and has lost 2lbs (293 to 291) in past 24hrs and his renal function is stable at 1.21 this morning. Repeat echo done 06/11/17 showed decreased ejection function of 30-35%, severe diffuse hypokinesis with no regional variations, moderate concentric hypertrophy.   -continue lasix 40mg  bid  -continue carvedilol 12.5mg  bid  -continue Coreg 12.5mg  BID -continue HH/Carb mod diet with fluid restriction to 1889mL  New Atrial Fibrillation CHA2DS2-VASc=3.TEE DCCV done 1/23 however converted back to Aflutter with RVR. He converted back to NSR and has remained in NSR.  -continue amiodarone    -switched form eliquis to xarelto as he will continue using it outpatient  Acute renal failuresecondary to post renal obstruction, BPH Patient's renal function is doing well today at 1.21.   -Continuing coude cath x4-6 weeks  -Follow-up with urology outpatient.   Diabetes mellitus type 2 The patient'sblood glucose has been ranging140-190. HgA1c=8.1  -continue Lantus 40units  -continueSSI -continue cymbalta for neuropathy  Essential Hypertension The patient's blood pressure over the past 24 hrs has ranged 130-156/82-90 with most recent reading being 130/82.  -continue losartan 25mg  qd  Constipation/ GI indigestion Seems to have improved.   -Continue Carafate, Protonix -GI cocktail prn, has not required any  Dispo: Anticipated discharge in approximately 1-2 days. Pending PT/OT.  [name], MD Internal Medicine PGY1 [phone] 06/13/2017, 6:31 AM

## 2017-06-13 NOTE — Plan of Care (Signed)
  Education: Knowledge of General Education information will improve 06/13/2017 0443 - Progressing by Rolm Baptise, RN 06/13/2017 [phone] - Progressing by Rolm Baptise, RN   Health Behavior/Discharge Planning: Ability to manage health-related needs will improve 06/13/2017 0043 - Progressing by Rolm Baptise, RN   Safety: Ability to remain free from injury will improve Description Bed in low position. Call bell within reach.  06/13/2017 0443 - Progressing by Karyl Kinnier D, RN   Skin Integrity: Risk for impaired skin integrity will decrease 06/13/2017 0443 - Progressing by Rolm Baptise, RN 06/13/2017 [phone] - Progressing by Rolm Baptise, RN   Activity: Risk for activity intolerance will decrease 06/13/2017 0043 - Progressing by Rolm Baptise, RN   Nutrition: Adequate nutrition will be maintained 06/13/2017 0043 - Progressing by Rolm Baptise, RN   Skin Integrity: Risk for impaired skin integrity will decrease 06/13/2017 0043 - Progressing by Rolm Baptise, RN   Cardiac: Ability to achieve and maintain adequate cardiopulmonary perfusion will improve 06/13/2017 0043 - Progressing by Rolm Baptise, RN   Education: Knowledge of disease or condition will improve 06/13/2017 0043 - Progressing by Rolm Baptise, RN   Metabolic: Ability to maintain appropriate glucose levels will improve 06/13/2017 0443 - Progressing by Rolm Baptise, RN   Tissue Perfusion: Adequacy of tissue perfusion will improve 06/13/2017 0043 - Progressing by Rolm Baptise, RN

## 2017-06-13 NOTE — Progress Notes (Signed)
ANTICOAGULATION CONSULT NOTE - Follow Up Consult  Pharmacy Consult for Xarelto Indication: Atrial flutter/Atrial fib  Allergies  Allergen Reactions  . Lisinopril Cough    Patient Measurements: Height: 6\' 1"  (185.4 cm) Weight: 291 lb 8 oz (132.2 kg)(scale c) IBW/kg (Calculated) : 79.9  Vital Signs: Temp: 98.6 F (37 C) (01/28 0519) Temp Source: Oral (01/28 0519) BP: 149/93 (01/28 0929) Pulse Rate: 88 (01/28 0929)  Labs: Recent Labs    06/10/17 1414  06/11/17 0422 06/11/17 1600 06/12/17 0410 06/13/17 0337  HGB  --    < > 12.2*  --  12.2* 11.6*  HCT  --   --  37.9*  --  37.7* 36.0*  PLT  --   --  146*  --  179 204  LABPROT 26.1*  --  23.8* 21.2*  --   --   INR 2.41  --  2.14 1.86  --   --   CREATININE  --   --  1.16  --  1.16 1.21   < > = values in this interval not displayed.    Estimated Creatinine Clearance: 93.7 mL/min (by C-G formula based on SCr of 1.21 mg/dL).  Assessment: 62 y.o. male with Aflutter/fib initially started on heparin bridge to coumadin. After 2 doses of coumadin he was then switched to apixaban. Pharmacy now asked to switch to xarelto for once daily dosing and better compliance. Last apixaban dose this morning at 0931. CrCl using TBW > 100 mL/min.  Goal of Therapy:  Monitor platelets by anticoagulation protocol: Yes   Plan:  1) Xarelto 20mg  po daily with supper 2) Case management consult ordered 3) Will provide education  Deboraha Sprang 06/13/2017,10:30 AM

## 2017-06-13 NOTE — Care Management Note (Signed)
Case Management Note  Patient Details  Name: Terez Freimark MRN: [id number] Date of Birth: 1955/06/11  Subjective/Objective:     Pt admitted with CHF. He is from home alone. Pt states he has people that can assist him at home if needed. Pt has no insurance but has been seen by financial counseling.  His PCP is Dr Maricela Bo with [facility]. [facility]. The cost of most of his medications are $6 a piece through the HD.    Action/Plan: Pt being transitioned to Xarelto. CM provided him with Xarelto 30 day free card. He is going to follow up with the HD to make sure they will assist with this cost also. He will notify Dr Maricela Bo if there are issues with the cost.   Expected Discharge Date:  06/09/17               Expected Discharge Plan:  Home/Self Care  In-House Referral:     Discharge planning Services     Post Acute Care Choice:    Choice offered to:     DME Arranged:    DME Agency:     HH Arranged:    HH Agency:     Status of Service:  In process, will continue to follow  If discussed at Long Length of Stay Meetings, dates discussed:    Additional Comments:  Pollie Friar, RN 06/13/2017, 1:06 PM

## 2017-06-13 NOTE — Discharge Summary (Signed)
Name: Terry Parks MRN: [id number] DOB: 12-05-1955 62 y.o. PCP: Terry Mage, MD  Date of Admission: 06/06/2017  9:18 AM Date of Discharge: 06/13/17 Attending Physician: Terry Parks, *  Discharge Diagnosis:  Principal Problem: Acute on chronic combined systolic (congestive) and diastolic (congestive) heart failure (HCC)  Active Problems: Acute renal failure (ARF) (HCC) Persistent atrial fibrillation (HCC) AKI (acute kidney injury) ([place]) Elevated troponin   Discharge Medications: Allergies as of 06/14/2017      Reactions   Lisinopril Cough      Medication List    STOP taking these medications   amLODipine-olmesartan 5-40 MG tablet Commonly known as:  AZOR     TAKE these medications   amiodarone 200 MG tablet Commonly known as:  PACERONE Take 1 tablet (200 mg total) by mouth daily.   Armodafinil 150 MG tablet Commonly known as:  NUVIGIL Take 1 tablet (150 mg total) by mouth daily. As needed. What changed:  additional instructions   aspirin 81 MG EC tablet Take 81 mg by mouth daily.   atorvastatin 40 MG tablet Commonly known as:  LIPITOR Take 1 tablet (40 mg total) by mouth daily.   carvedilol 12.5 MG tablet Commonly known as:  COREG Take 1 tablet (12.5 mg total) by mouth 2 (two) times daily with a meal. What changed:    medication strength  how much to take  when to take this   Cholecalciferol 2000 units Tabs Take 1 tablet (2,000 Units total) by mouth daily.   DULoxetine 60 MG capsule Commonly known as:  CYMBALTA Take 1 capsule (60 mg total) by mouth daily.   empagliflozin 10 MG Tabs tablet Commonly known as:  JARDIANCE Take 10 mg by mouth daily.   furosemide 40 MG tablet Commonly known as:  LASIX Take 1 tablet (40 mg total) by mouth 2 (two) times daily.   glucose blood test strip Commonly known as:  TRUETRACK TEST Use as directed to test blood sugar three times a day before meals.   Insulin Glargine 100 UNIT/ML Solostar  Pen Commonly known as:  LANTUS SOLOSTAR Inject 56 Units into the skin daily. What changed:  how much to take   Insulin Pen Needle 31G X 6 MM Misc Commonly known as:  CARETOUCH PEN NEEDLES 1 pen by Does not apply route at bedtime.   Ipratropium-Albuterol 20-100 MCG/ACT Aers respimat Commonly known as:  COMBIVENT Inhale 1 puff into the lungs every 6 (six) hours as needed for wheezing or shortness of breath.   isosorbide mononitrate 30 MG 24 hr tablet Commonly known as:  IMDUR Take 1 tablet (30 mg total) by mouth daily.   Lancets Misc Use to test blood sugar three times a day before meals.   losartan 50 MG tablet Commonly known as:  COZAAR Take 1 tablet (50 mg total) by mouth daily. Start taking on:  06/15/2017   metFORMIN 1000 MG tablet Commonly known as:  GLUCOPHAGE Take 1 tablet (1,000 mg total) by mouth 2 (two) times daily with a meal.   Misc. Devices Misc by Does not apply route. C-PAP   tamsulosin 0.4 MG Caps capsule Commonly known as:  FLOMAX Take 1 capsule (0.4 mg total) by mouth daily.            Durable Medical Equipment  (From admission, onward)        Start     Ordered   06/14/17 1112  For home use only DME Walker rolling  Once    Question:  Patient  needs a walker to treat with the following condition  Answer:  Impaired ambulation   06/14/17 1112      Disposition and follow-up:   Terry Parks was discharged from [facility] in stable condition.  At the hospital follow up visit please address:  1. Acute on chronic combined heart failure: please ensure that the patient is taking carvedilol 25 mg twice daily, Lasix 40 mg twice daily, Imdur 30 mg daily, and losartan 50 mg daily.  Acute renal failure secondary to post renal obstruction: Patient should continue wearing daily catheter and follow-up with urology in 2 weeks.  Atrial fibrillation: Please ensure that the patient is taking amiodarone 200 mg daily and Xarelto  2.  Labs  / imaging needed at time of follow-up: Check weight, BMP to check renal function  3.  Pending labs/ test needing follow-up: None  Follow-up Appointments: Follow-up Information    Terry Mage, MD Follow up on 06/21/2017.   Specialty:  Internal Medicine Why:  2:45pm Contact information: [place] [address] [phone]        Terry Klein, MD Follow up.   Specialty:  Cardiology Why:  You have a cardiology hospital follow up appointment on February 15th at 8:00 with Terry Parks, Dr. Lurline Del Parks. Please arrive 15 minutes early.  Contact information: [address] [place] [address] [phone]        [facility] . Schedule an appointment as soon as possible for a visit.   Specialty:  Urology Contact information: [address] [place] [place] [phone]          Hospital Course by problem list:  Acute on chronic combined systolic (congestive) and diastolic (congestive) heart failure [facility]) [name] presented with shortness of breath, 20lb weight gain, peripheral edema, and cool extremities for the past 2 weeks. Chest x-ray showed cardiomegaly and pulmonary vascular congestion, BNP was 991.6, and troponin peaked at 0.24. The patient's increased troponin were likely due to demand ischemia.  Initial echo during this admission was limited and endocardial segments were not able to be adequately visualized.  Repeat echo showed LVEF of 30-35%, severe diffuse hypokinesis, no rate identifiable regional variations. The patient was thought to have a heart failure exacerbation secondary to medication noncompliance and due to urinary retention. The patient was given iv lasix, coreg, and losartan. The patient diuresed a total of -21L and decreased 33lbs (325lb to 292lb). The patient was stable the day of discharge and weighed 292lbs.   Acute renal failure (ARF) (HCC) The patient presented with an elevated  creatinine of 6.24 from baseline of 1.3. Renal ultrasound showed mild right hydronephrosis. He was placed on a coude catheter for 4-6 weeks to allows the bladder time to rest. The patient was able to diurese well with coude cath.  He was discharged on tamsulosin and with a coude cath.  The patient's creatinine on day of discharge was 1.02.  Persistent atrial fibrillation ([place]) On day of admission the patient had an irregularly irregular heart rate and EKG noted atrial flutter which subsequently converted to atrial fibrillation the next day with rapid ventricular rate. His CHA2DS2-VASc=3, the patient was adequately anticoagulated throughout the hospitalization with either heparin, warfarin or rivaroxaban. Amiodarone was started and the patient was DCCV on 06/08/17 which successfully converted to nsr. Patient converted back to atrial flutter for temporary.  But we converted to normal sinus rhythm when amiodarone was restarted.  The patient has received  6 g of amiodarone loading and was discharged on amiodarone 200 mg daily. Also was given Xarelto on discharge.  Discharge Vitals:   BP 135/80   Pulse 90   Temp 98 F (36.7 C) (Oral)   Resp 18   Ht 6\' 1"  (1.854 m)   Wt 292 lb 3.2 oz (132.5 kg) Comment: scale c  SpO2 98%   BMI 38.55 kg/m   Pertinent Labs, Studies, and Procedures:   CBC    Component Value Date/Time   WBC 9.1 06/14/2017 0358   RBC 4.37 06/14/2017 0358   HGB 11.4 (L) 06/14/2017 0358   HCT 36.1 (L) 06/14/2017 0358   PLT 231 06/14/2017 0358   MCV 82.6 06/14/2017 0358   MCH 26.1 06/14/2017 0358   MCHC 31.6 06/14/2017 0358   RDW 14.9 06/14/2017 0358   LYMPHSABS 2.1 06/07/2017 0258   MONOABS 2.6 06/07/2017 0258   EOSABS 0.0 06/07/2017 0258   BASOSABS 0.0 06/07/2017 0258   BMP Latest Ref Rng & Units 06/14/2017 06/13/2017 06/12/2017  Glucose 65 - 99 mg/dL 101(H) 140(H) 90  BUN 6 - 20 mg/dL 7 9 7   Creatinine 0.61 - 1.24 mg/dL 1.02 1.21 1.16  BUN/Creat Ratio 9 - 20 - - -  Sodium  135 - 145 mmol/L 140 138 138  Potassium 3.5 - 5.1 mmol/L 3.6 3.5 3.5  Chloride 101 - 111 mmol/L 104 101 99(L)  CO2 22 - 32 mmol/L 28 27 27   Calcium 8.9 - 10.3 mg/dL 8.0(L) 8.1(L) 8.1(L)    EKG (06/06/17): personally reviewed my interpretation is irregular rhythm, atrial fibrillation vs atrial flutter  CXR (06/06/17): personally reviewed my interpretation is vascular congestion and cardiomegaly.  Renal Ultrasound (06/06/17): 1. Mild right hydronephrosis 2. Poorly visible left kidney, but without definitive hydronephrosis 3. Distended urinary bladder. 4.6 cm echogenic mass in the bladder, could represent focal hematoma or hypovascular soft tissue mass. Correlation with direct visualization may be considered.  Chest x-ray (06/06/17): Cardiomegaly. Mild central pulmonary vascular congestion. No frank pulmonary edema.  Echo (06/07/17): - Endocardial segments are not adequately visualized to comment on   LVF or wall motion. recommend limited echo with definity contrast   to determine accurate EF and wall motion.  Echo (06/11/17): LVEF 30-35%, severe diffuse hypokinesis, lv diastolic function, moderate concentric hypertrophy, left atrium severely dilated  Discharge Instructions: Discharge Instructions    (HEART FAILURE PATIENTS) Call MD:  Anytime you have any of the following symptoms: 1) 3 pound weight gain in 24 hours or 5 pounds in 1 week 2) shortness of breath, with or without a dry hacking cough 3) swelling in the hands, feet or stomach 4) if you have to sleep on extra pillows at night in order to breathe.   Complete by:  As directed    Call MD for:  persistant dizziness or light-headedness   Complete by:  As directed    Call MD for:  persistant nausea and vomiting   Complete by:  As directed    Call MD for:  temperature >100.4   Complete by:  As directed    Diet - low sodium heart healthy   Complete by:  As directed    Increase activity slowly   Complete by:  As directed        Signed: [name], MD Internal Medicine PGY1 Pager:[phone] 06/14/2017, 11:32 AM

## 2017-06-14 ENCOUNTER — Telehealth: Payer: Self-pay

## 2017-06-14 DIAGNOSIS — R339 Retention of urine, unspecified: Secondary | ICD-10-CM

## 2017-06-14 DIAGNOSIS — Z9114 Patient's other noncompliance with medication regimen: Secondary | ICD-10-CM

## 2017-06-14 DIAGNOSIS — R7989 Other specified abnormal findings of blood chemistry: Secondary | ICD-10-CM

## 2017-06-14 DIAGNOSIS — N1330 Unspecified hydronephrosis: Secondary | ICD-10-CM

## 2017-06-14 LAB — BASIC METABOLIC PANEL
Anion gap: 8 (ref 5–15)
BUN: 7 mg/dL (ref 6–20)
CHLORIDE: 104 mmol/L (ref 101–111)
CO2: 28 mmol/L (ref 22–32)
CREATININE: 1.02 mg/dL (ref 0.61–1.24)
Calcium: 8 mg/dL — ABNORMAL LOW (ref 8.9–10.3)
GFR calc non Af Amer: >60 mL/min (ref >60)
Glucose, Bld: 101 mg/dL — ABNORMAL HIGH (ref 65–99)
POTASSIUM: 3.6 mmol/L (ref 3.5–5.1)
SODIUM: 140 mmol/L (ref 135–145)

## 2017-06-14 LAB — GLUCOSE, CAPILLARY
GLUCOSE-CAPILLARY: 85 mg/dL (ref 65–99)
GLUCOSE-CAPILLARY: 184 mg/dL — AB (ref 65–99)

## 2017-06-14 LAB — CBC
HCT: 36.1 % — ABNORMAL LOW (ref 39.0–52.0)
HEMOGLOBIN: 11.4 g/dL — AB (ref 13.0–17.0)
MCH: 26.1 pg (ref 26.0–34.0)
MCHC: 31.6 g/dL (ref 30.0–36.0)
MCV: 82.6 fL (ref 78.0–100.0)
Platelets: 231 10*3/uL (ref 150–400)
RBC: 4.37 MIL/uL (ref 4.22–5.81)
RDW: 14.9 % (ref 11.5–15.5)
WBC: 9.1 10*3/uL (ref 4.0–10.5)

## 2017-06-14 MED ORDER — CARVEDILOL 12.5 MG PO TABS: 12.5000 mg | ORAL_TABLET | Freq: Two times a day (BID) | ORAL | 0 refills | Status: DC

## 2017-06-14 MED ORDER — TAMSULOSIN HCL 0.4 MG PO CAPS: 0.4000 mg | ORAL_CAPSULE | Freq: Every day | ORAL | 0 refills | Status: DC

## 2017-06-14 MED ORDER — FUROSEMIDE 40 MG PO TABS: 40.0000 mg | ORAL_TABLET | Freq: Two times a day (BID) | ORAL | 0 refills | Status: DC

## 2017-06-14 MED ORDER — AMIODARONE HCL 200 MG PO TABS: 200.0000 mg | ORAL_TABLET | Freq: Every day | ORAL | 0 refills | Status: DC

## 2017-06-14 MED ORDER — LOSARTAN POTASSIUM 50 MG PO TABS: 50.0000 mg | ORAL_TABLET | Freq: Every day | ORAL | 0 refills | Status: DC

## 2017-06-14 MED ORDER — TAMSULOSIN HCL 0.4 MG PO CAPS
0.4000 mg | ORAL_CAPSULE | Freq: Every day | ORAL | Status: DC
  Administered 2017-06-14: 0.4 mg via ORAL
  Filled 2017-06-14: qty 1

## 2017-06-14 NOTE — Progress Notes (Signed)
Physical Therapy Treatment Patient Details Name: Terry Parks MRN: [id number] DOB: 06/22/1955 Today's Date: 06/14/2017    History of Present Illness Pt is a 62 y.o. male admitted with acute on chronic combined systolic and diastolic heart failure and new atrial fibrillation. Pt with PMH significant for BPH, diabetes mellitus, cardiomegaly, headache, hyperlipidemia, hypertension, hypertensive cardiopathy, obstructive sleep apnea, and renal calculus.     PT Comments    Pt performed increased activity with max cues for encouragement.  PTA instructed patient through stair training for simulation into home at d/c.  Pt slow and guarded and remains unsteady with out use of RW.  Pt will require RW at d/c for safe d/c home.  Pt remains to complain of pain in B feet with activity.      Follow Up Recommendations  Home health PT;Supervision for mobility/OOB     Equipment Recommendations  Rolling walker with 5" wheels    Recommendations for Other Services       Precautions / Restrictions Precautions Precautions: Fall Restrictions Weight Bearing Restrictions: No    Mobility  Bed Mobility               General bed mobility comments: Pt sitting in straight back chair on arrival.    Transfers Overall transfer level: Needs assistance Equipment used: None;Rolling walker (2 wheeled) Transfers: Sit to/from Stand Sit to Stand: Supervision         General transfer comment: Supervision for safety. Pt intially unsteady upon standing without device.  PTA brought RW to patient to improve balance and function.    Ambulation/Gait Ambulation/Gait assistance: Min guard Ambulation Distance (Feet): 180 Feet Assistive device: Rolling walker (2 wheeled) Gait Pattern/deviations: Step-through pattern;Decreased stride length;Trunk flexed;Wide base of support Gait velocity: Decreased    General Gait Details: Slow flexed gait with cues for close proximity to RW.  Pt required cues for forward  gaze.     Stairs Stairs: Yes   Stair Management: Two rails;Step to pattern;Alternating pattern;Forwards Number of Stairs: 10 General stair comments: Step to ascend and alternating pattern to descend.    Wheelchair Mobility    Modified Rankin (Stroke Patients Only)       Balance Overall balance assessment: Needs assistance Sitting-balance support: No upper extremity supported;Feet supported Sitting balance-Leahy Scale: Fair     Standing balance support: No upper extremity supported;During functional activity;Bilateral upper extremity supported Standing balance-Leahy Scale: Fair Standing balance comment: Reliant on B UE support.                              Cognition Arousal/Alertness: Awake/alert Behavior During Therapy: WFL for tasks assessed/performed Overall Cognitive Status: Within Functional Limits for tasks assessed                                        Exercises      General Comments        Pertinent Vitals/Pain Pain Assessment: Faces Faces Pain Scale: Hurts a little bit Pain Location: feet Pain Descriptors / Indicators: Aching Pain Intervention(s): Monitored during session;Repositioned    Home Living                      Prior Function            PT Goals (current goals can now be found in the care plan section) Acute Rehab  PT Goals Patient Stated Goal: to go home and get stronger Potential to Achieve Goals: Good Progress towards PT goals: Progressing toward goals    Frequency    Min 3X/week      PT Plan Current plan remains appropriate    Co-evaluation              AM-PAC PT "6 Clicks" Daily Activity  Outcome Measure  Difficulty turning over in bed (including adjusting bedclothes, sheets and blankets)?: A Little Difficulty moving from lying on back to sitting on the side of the bed? : A Little Difficulty sitting down on and standing up from a chair with arms (e.g., wheelchair, bedside  commode, etc,.)?: Unable Help needed moving to and from a bed to chair (including a wheelchair)?: A Little Help needed walking in hospital room?: A Little Help needed climbing 3-5 steps with a railing? : A Lot 6 Click Score: 15    End of Session Equipment Utilized During Treatment: Gait belt Activity Tolerance: Patient tolerated treatment well Patient left: in bed;with call bell/phone within reach Nurse Communication: Mobility status PT Visit Diagnosis: Other abnormalities of gait and mobility (R26.89);Unsteadiness on feet (R26.81)     Time: 5621-3086 PT Time Calculation (min) (ACUTE ONLY): 12 min  Charges:  $Gait Training: 8-22 mins                    G Codes:       Terry Parks, PTA pager [phone]    Cristela Blue 06/14/2017, 2:55 PM

## 2017-06-14 NOTE — Progress Notes (Addendum)
Pt slept well during the night on CPAP, Vitals stable, no any sign of SOB and distress noted, no any complain of pain, Coude Catheter continue,  will continue to monitor the patient.  Palma Holter, RN

## 2017-06-14 NOTE — Progress Notes (Signed)
Patient requests that IV team wait 10-15 minutes before pulling PICC so that he can finish his lunch.  Judson Roch, RN aware.  Carolee Rota, RN

## 2017-06-14 NOTE — Telephone Encounter (Signed)
[facility]OSP[facility], DISCHARGE 06/14/2017, APPT 06/21/2017.

## 2017-06-14 NOTE — Progress Notes (Signed)
   Subjective: Terry Parks was seen resting in his bed this morning with cpap mask on. He stated that he no longer has any chest pain or difficulty breathing.  He states that he continues to have lower extremity tingling pain.  Objective:  Vital signs in last 24 hours: Vitals:   06/13/17 1223 06/13/17 1709 06/13/17 2100 06/14/17 0527  BP: (!) 160/85 (!) 148/74 (!) 145/86 (!) 147/99  Pulse: 91 93 86 85  Resp: 20  20 18   Temp: 98.3 F (36.8 C)  97.6 F (36.4 C) 98 F (36.7 C)  TempSrc: Oral  Oral Oral  SpO2: 100%  95% 98%  Weight:    292 lb 3.2 oz (132.5 kg)  Height:       Physical Exam  Constitutional: He appears well-developed and well-nourished. No distress.  HENT:  Head: Normocephalic and atraumatic.  Eyes: Conjunctivae are normal.  Cardiovascular: Normal rate, regular rhythm, normal heart sounds and intact distal pulses.  Respiratory: Effort normal and breath sounds normal. No respiratory distress. He has no wheezes.  GI: Soft. Bowel sounds are normal. He exhibits no distension. There is no tenderness.  Genitourinary: Prostate normal and penis normal.  Musculoskeletal: He exhibits edema (1+ pitting).  Neurological: He is alert.  Skin: He is not diaphoretic.  Psychiatric: He has a normal mood and affect. His behavior is normal. Judgment and thought content normal.   Assessment/Plan:  Acute on chronic combinedsystolic and diastolic heart failure The patient put out net -1.9L over the past 24 hrs. The patient's weight today is 292lbs in comparison to 291lbs.   Repeat echo done 06/11/17 showed decreased ejection function of 30-35%, severe diffuse hypokinesis with no regional variations, moderate concentric hypertrophy.   -continue lasix 40mg  bid  -continue carvedilol 12.5mg  bid  -continue losartan 50mg  qd -continue HH/Carb mod diet with fluid restriction to 1843mL -PT and OT recommended rolling walker and home health pt  NewAtrial Fibrillation CHA2DS2-VASc=3.TEE  DCCV done 1/23however converted back to Aflutter with RVR. He converted back to NSR and has remained in NSR.  -The patient has received 6200mg  of amiodarone loading since admission. Will change from amiodarone 200mg  bid to 200mg  qd today 06/14/17 -Continue xarelto   Acute renal failuresecondary to post renal obstruction, BPH Patient's renal function is doing well today at 1.02.   -Continuing coude cath x4-6 weeks  -Follow-up with urology outpatient.  Diabetes mellitus type 2 The patient'sblood glucose has been ranging140-190.HgA1c=8.1  -continue Lantus 40units  -continueSSI -continue cymbalta for neuropathy  Essential Hypertension The patient's blood pressure over the past 24 hrs has ranged 145-160/85-86 with most recent reading being 147/99.  -continue losartan 50mg  qd  Constipation/ GI indigestion Seems to have improved.   -Continue Carafate, Protonix -GI cocktailprn, has not required any  Dispo: Anticipated discharge today.  [name], MD Internal Medicine PGY1 [phone] 06/14/2017, 6:48 AM

## 2017-06-14 NOTE — Progress Notes (Signed)
Feels well. Lying fully supine in bed, comfortable. OK for DC today. Will arrange early follow up. Reevaluate LVEF in 2-3 months by echo. Reviewed daily weight monitoring/log, sodium restriction, signs and symptoms of HF exacerbation.  [name], MD, [facility] [phone] office [phone] pager

## 2017-06-14 NOTE — Progress Notes (Signed)
Pt discharge instructions reviewed with pt. Pt verbalizes understanding. Pt belongings with pt. Pt is not in distress. Pt is waiting for his ride to arrive.RN educated pt about how to empty his foley.

## 2017-06-14 NOTE — Progress Notes (Signed)
CM talked to patient about [place] choices, pt chose [facility]; Jermane with [facility] called for arrangements; RW ordered as requested and to be delivered to his room today prior to discharging home; Aneta Mins [phone]

## 2017-06-14 NOTE — Progress Notes (Signed)
Pt discharged via wheel chair.

## 2017-06-14 NOTE — Progress Notes (Signed)
Occupational Therapy Treatment Patient Details Name: Terry Parks MRN: [id number] DOB: March 25, 1956 Today's Date: 06/14/2017    History of present illness Pt is a 62 y.o. male admitted with acute on chronic combined systolic and diastolic heart failure and new atrial fibrillation. Pt with PMH significant for BPH, diabetes mellitus, cardiomegaly, headache, hyperlipidemia, hypertension, hypertensive cardiopathy, obstructive sleep apnea, and renal calculus.    OT comments  Pt demonstrating progress toward OT goals this session. Educated concerning use of AE for LB ADL as well as energy conservation strategies (with handout provided) to maximize his ability to participate in ADL and IADL. Pt was able to complete LB dressing tasks with supervision for safety this session and was able to complete toilet transfers with supervision as well. Pt would benefit from continued OT services while admitted and D/C recommendation remains appropriate.   Follow Up Recommendations  Home health OT;Supervision - Intermittent    Equipment Recommendations  3 in 1 bedside commode    Recommendations for Other Services      Precautions / Restrictions Precautions Precautions: Fall Restrictions Weight Bearing Restrictions: No       Mobility Bed Mobility               General bed mobility comments: OOB on my arrival  Transfers Overall transfer level: Needs assistance Equipment used: None Transfers: Sit to/from Stand Sit to Stand: Supervision         General transfer comment: Supervision for safety.     Balance Overall balance assessment: Needs assistance Sitting-balance support: No upper extremity supported;Feet supported Sitting balance-Leahy Scale: Fair     Standing balance support: No upper extremity supported;During functional activity;Bilateral upper extremity supported Standing balance-Leahy Scale: Fair Standing balance comment: Able to maintain static standing without UE support.                             ADL either performed or assessed with clinical judgement   ADL Overall ADL's : Needs assistance/impaired                     Lower Body Dressing: Supervision/safety;Sit to/from stand   Toilet Transfer: Supervision/safety;Ambulation;Regular Toilet;Grab bars           Functional mobility during ADLs: Supervision/safety General ADL Comments: Pt educated concerning energy conservation strategies to incorporate into daily routine with handout povided. Additionally educated pt concerning use of AE for LB ADL to maximize energy conservation with AE kit provided.      Vision   Vision Assessment?: No apparent visual deficits   Perception     Praxis      Cognition Arousal/Alertness: Awake/alert Behavior During Therapy: WFL for tasks assessed/performed Overall Cognitive Status: Within Functional Limits for tasks assessed                                          Exercises     Shoulder Instructions       General Comments      Pertinent Vitals/ Pain       Pain Assessment: No/denies pain  Home Living                                          Prior Functioning/Environment  Frequency  Min 2X/week        Progress Toward Goals  OT Goals(current goals can now be found in the care plan section)  Progress towards OT goals: Progressing toward goals  Acute Rehab OT Goals Patient Stated Goal: to go home and get stronger OT Goal Formulation: With patient Time For Goal Achievement: 06/27/17 Potential to Achieve Goals: Good  Plan Discharge plan remains appropriate    Co-evaluation                 AM-PAC PT "6 Clicks" Daily Activity     Outcome Measure   Help from another person eating meals?: A Little Help from another person taking care of personal grooming?: A Little Help from another person toileting, which includes using toliet, bedpan, or urinal?: A  Little Help from another person bathing (including washing, rinsing, drying)?: A Little Help from another person to put on and taking off regular upper body clothing?: A Little Help from another person to put on and taking off regular lower body clothing?: A Little 6 Click Score: 18    End of Session    OT Visit Diagnosis: Other abnormalities of gait and mobility (R26.89);Muscle weakness (generalized) (M62.81)   Activity Tolerance Patient tolerated treatment well   Patient Left in bed;with call bell/phone within reach   Nurse Communication Mobility status;Other (comment)(pt's lunch arrived (RN requested to know when))        Time: 1225-1250 OT Time Calculation (min): 25 min  Charges: OT General Charges $OT Visit: 1 Visit OT Treatments $Self Care/Home Management : 23-37 mins  Norman Herrlich, MS OTR/L  Pager: [place] 06/14/2017, 1:54 PM

## 2017-06-14 NOTE — Progress Notes (Signed)
Leg bag applied to pt per MD order.

## 2017-06-15 ENCOUNTER — Telehealth: Payer: Self-pay | Admitting: *Deleted

## 2017-06-15 ENCOUNTER — Other Ambulatory Visit: Payer: Self-pay | Admitting: Internal Medicine

## 2017-06-15 NOTE — Telephone Encounter (Signed)
Pt presented to clinic with rxs - stated the pharmacy gave them back to him b/c they were not signed. Paged Dr Maricela Bo - stated thought  rxs were sent electronically; stated she will re-send rxs. Called back, asked if I would call them to the pharmacy.  Amiodarone 200 mg, Carvedilol 12.5 mg , Furosemide 40 mg , Losartan 50 mg, and Flomax 0.4 mg called to [facility].

## 2017-06-15 NOTE — Telephone Encounter (Signed)
Thank you Glenda... 

## 2017-06-21 ENCOUNTER — Encounter: Payer: Self-pay | Admitting: Internal Medicine

## 2017-06-21 ENCOUNTER — Ambulatory Visit (INDEPENDENT_AMBULATORY_CARE_PROVIDER_SITE_OTHER): Payer: Self-pay | Admitting: Internal Medicine

## 2017-06-21 ENCOUNTER — Ambulatory Visit: Payer: Self-pay

## 2017-06-21 ENCOUNTER — Other Ambulatory Visit: Payer: Self-pay

## 2017-06-21 VITALS — BP 143/79 | HR 104 | Temp 97.4°F | Ht 73.0 in | Wt 285.7 lb

## 2017-06-21 DIAGNOSIS — N179 Acute kidney failure, unspecified: Secondary | ICD-10-CM

## 2017-06-21 DIAGNOSIS — Z7982 Long term (current) use of aspirin: Secondary | ICD-10-CM

## 2017-06-21 DIAGNOSIS — Z79899 Other long term (current) drug therapy: Secondary | ICD-10-CM

## 2017-06-21 DIAGNOSIS — Z87448 Personal history of other diseases of urinary system: Secondary | ICD-10-CM

## 2017-06-21 DIAGNOSIS — I4819 Other persistent atrial fibrillation: Secondary | ICD-10-CM

## 2017-06-21 DIAGNOSIS — N1330 Unspecified hydronephrosis: Secondary | ICD-10-CM

## 2017-06-21 DIAGNOSIS — G4733 Obstructive sleep apnea (adult) (pediatric): Secondary | ICD-10-CM

## 2017-06-21 DIAGNOSIS — Z8719 Personal history of other diseases of the digestive system: Secondary | ICD-10-CM

## 2017-06-21 DIAGNOSIS — E119 Type 2 diabetes mellitus without complications: Secondary | ICD-10-CM

## 2017-06-21 DIAGNOSIS — I481 Persistent atrial fibrillation: Secondary | ICD-10-CM

## 2017-06-21 DIAGNOSIS — I10 Essential (primary) hypertension: Secondary | ICD-10-CM

## 2017-06-21 DIAGNOSIS — I110 Hypertensive heart disease with heart failure: Secondary | ICD-10-CM

## 2017-06-21 DIAGNOSIS — I5043 Acute on chronic combined systolic (congestive) and diastolic (congestive) heart failure: Secondary | ICD-10-CM

## 2017-06-21 DIAGNOSIS — Z960 Presence of urogenital implants: Secondary | ICD-10-CM

## 2017-06-21 NOTE — Assessment & Plan Note (Signed)
The patient's blood pressure during this visit was 143/79,104.  The patient is currently taking carvedilol 12.5 mg twice daily and losartan 50 mg daily.   -continue carvedilol 12.5mg  bid -continue losartan 50mg  qd

## 2017-06-21 NOTE — Progress Notes (Signed)
   CC: Heart failure follow up   HPI:  [name] is a 62 y.o. with essential hypertension, acute on chronic combined diastolic and systolic heart failure, OSA, type 2 diabetes mellitus who presents for heart failure follow-up. Please see problem based charting for evaluation, assessment, and plan.   Past Medical History:  Diagnosis Date  . Abscess of groin   . Abscessed tooth   . Allergic rhinitis   . BPH (benign prostatic hypertrophy)    Massive BPH noted on cystoscopy 1/23/ 2012 by Dr. [name].  . Cardiomegaly    Mild by CXR 11/04/04  . Diabetes mellitus 04/08/2008  . Headache(784.0)   . Hyperlipemia   . Hypertension   . Hypertensive cardiopathy 03/01/2006   2-D echocardiogram 02/01/2012 showed moderate LVH, mildly to moderately reduced left ventricular systolic function with an estimated ejection fraction of 40-45%, and diffuse hypokinesis.  A nuclear medicine stress study done 01/31/2012 showed no reversible ischemia, a small mid anterior wall fixed defect/infarct, and ejection fraction 42%.      . Neck pain   . Obstructive sleep apnea 03/06/2008   Sleep study 03/06/08 showed severe OSA/hypopnea syndrome, with successful CPAP titration to 13 CWP using a medium ResMed Mirage Quattro full face mask with heated humidifier.   . Renal calculus 05/29/2010   CT scan of abdomen/pelvis on 05/29/2010 showed an obstructing approximate 1-2 mm calculus at the left UVJ, and an approximate 1-2 mm left lower pole renal calculus.   Patient had continuing severe pain , and an elevation of his serum creatinine to a value of 1.75 on 06/06/2010.  The stone had apparently passed and was not seen on repeat CT 06/08/2010.   Review of Systems:    Has some itching, denies sob  Physical Exam:  Vitals:   06/21/17 1522  BP: (!) 143/79  Pulse: (!) 104  Temp: (!) 97.4 F (36.3 C)  TempSrc: Oral  SpO2: 96%  Weight: 285 lb 11.2 oz (129.6 kg)  Height: 6\' 1"  (1.854 m)   Physical Exam    Constitutional: He appears well-developed and well-nourished. No distress.  HENT:  Head: Normocephalic and atraumatic.  Eyes: Conjunctivae are normal.  Cardiovascular: Normal rate, regular rhythm and normal heart sounds.  Respiratory: Effort normal and breath sounds normal. No respiratory distress. He has no wheezes.  GI: Soft. Bowel sounds are normal. He exhibits no distension. There is no tenderness.  Neurological: He is alert.  Skin: He is not diaphoretic. No erythema.  Psychiatric: He has a normal mood and affect. His behavior is normal. Judgment and thought content normal.     Assessment & Plan:   See Encounters Tab for problem based charting.  Patient discussed with Dr. Evette Parks

## 2017-06-21 NOTE — Assessment & Plan Note (Signed)
The patient was recently admitted in the hospital from1/21/19-1/28/19 acute on chronic combined systolic and diastolic heart failure.  Patient's dry weight on discharge was noted to be 292 LBS.  Today the patient weighs 285lbs. He also appears euvolemic on exam without any peripheral edema or any crackles on lungs. Patient is currently being prescribed carvedilol 12.5 mg twice daily, Lasix 40 mg twice daily, Imdur 30 mg daily, and losartan 50 mg daily.  -continue carvedilol 12.5 mg twice daily, Lasix 40 mg twice daily, Imdur 30 mg daily, and losartan 50 mg daily -The patient has history of poor compliance with medication. Placed referral to clinical pharmacy to help assist in setting up pill pack for patient.  -Follow up in 3 weeks to note compliance to medication

## 2017-06-21 NOTE — Assessment & Plan Note (Signed)
The patient had aki during his recent hospitalization and developed mild right hydronephrosis. Coude catheter was placed for the patient for 4-6 weeks. He is continuing to diurese well with catheter. His bladder should have had sufficient time to heal from bladder distension.   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

## 2017-06-21 NOTE — Assessment & Plan Note (Addendum)
The patient was recently diagnosed with atrial fibrillation during his hospitalization.  He was prescribed amiodarone 200 mg daily and Xarelto. The patient states he just picked up his medication and plans to take it as instructed. He will also follow up with cardiology next week.   -Continue amiodarone 200mg  qd and xarelto

## 2017-06-21 NOTE — Patient Instructions (Addendum)
It was a pleasure to see you today Terry Parks. Please make the following changes:  -Please call and make appointment with urology to get leg bag off -Please follow up with me in 3 weeks -Please take all of your medication. I have listed your medications based on indication below:   Heart failure: Carvedilol 12.5 mg twice daily Lasix 40 mg twice daily Imdur 30 mg daily Losartan 50 mg daily Aspirin 81 mg daily  Atrial Fibrillation: Amiodarone 200mg  daily Xarelto  Diabetes: Metformin 1000mg  twice daily Jardiance 10mg  daily  Cholesterol:  Atorvastatin 40mg  daily  Prostate: Tamsulosin 0.4mg  daily  If you have any questions or concerns, please call our clinic at [phone] between 9am-5pm and after hours call [phone] and ask for the internal medicine resident on call. If you feel you are having a medical emergency please call 911.   Thank you, we look forward to help you remain healthy!  [name], MD Internal Medicine PGY1

## 2017-06-22 ENCOUNTER — Telehealth: Payer: Self-pay | Admitting: *Deleted

## 2017-06-22 NOTE — Telephone Encounter (Signed)
Pt calls for pen needles, should have good script at [facility], [place], he will check and call back if needed

## 2017-06-22 NOTE — Telephone Encounter (Signed)
Has had f/u with dr Maricela Bo, states everything is good, no problems, does not have time to answer questions

## 2017-06-23 NOTE — Addendum Note (Signed)
Addended by: [name] on: 06/23/2017 08:53 AM   Modules accepted: Orders

## 2017-06-23 NOTE — Progress Notes (Signed)
Internal Medicine Clinic Attending  Case discussed with Dr. [name] at the time of the visit.  We reviewed the resident's history and exam and pertinent patient test results.  I agree with the assessment, diagnosis, and plan of care documented in the resident's note. 

## 2017-06-24 ENCOUNTER — Encounter: Payer: Self-pay | Admitting: Internal Medicine

## 2017-06-28 ENCOUNTER — Ambulatory Visit: Payer: Self-pay | Admitting: Pharmacist

## 2017-06-28 DIAGNOSIS — E1169 Type 2 diabetes mellitus with other specified complication: Secondary | ICD-10-CM

## 2017-06-28 DIAGNOSIS — I4819 Other persistent atrial fibrillation: Secondary | ICD-10-CM

## 2017-06-28 DIAGNOSIS — R079 Chest pain, unspecified: Secondary | ICD-10-CM

## 2017-06-28 DIAGNOSIS — E785 Hyperlipidemia, unspecified: Principal | ICD-10-CM

## 2017-06-28 DIAGNOSIS — I10 Essential (primary) hypertension: Secondary | ICD-10-CM

## 2017-06-28 MED ORDER — EMPAGLIFLOZIN-METFORMIN HCL ER 5-1000 MG PO TB24: 1.0000 | ORAL_TABLET | Freq: Two times a day (BID) | ORAL | 11 refills | Status: DC

## 2017-06-28 MED ORDER — RIVAROXABAN 20 MG PO TABS: 20.0000 mg | ORAL_TABLET | Freq: Every day | ORAL | 11 refills | Status: DC

## 2017-06-28 MED ORDER — ATORVASTATIN CALCIUM 40 MG PO TABS: 40.0000 mg | ORAL_TABLET | Freq: Every day | ORAL | 3 refills | Status: DC

## 2017-06-28 MED ORDER — ISOSORBIDE MONONITRATE ER 30 MG PO TB24: 30.0000 mg | ORAL_TABLET | Freq: Every day | ORAL | 3 refills | Status: DC

## 2017-06-28 MED ORDER — AZILSARTAN MEDOXOMIL 40 MG PO TABS: 1.0000 | ORAL_TABLET | Freq: Every day | ORAL | 11 refills | Status: DC

## 2017-06-28 NOTE — Progress Notes (Addendum)
Patient was seen in clinic with Hedwig Morton, PharmD candidate. I agree with the assessment and plan of care documented.  D/c [place] and switch to [place] for [employer] formulary  Discontinued aspirin 81 mg daily upon PCP approval. Patient verbalized understanding.

## 2017-06-28 NOTE — Progress Notes (Signed)
S: Terry Parks is a 62 y.o. male reports to clinical pharmacist appointment for Medication reconciliation and medication management. Patient did bring medication bottles.   Allergies  Allergen Reactions  . Lisinopril Cough   Prior to Admission medications   Medication Sig Start Date End Date Taking? Authorizing Provider  amiodarone (PACERONE) 200 MG tablet Take 1 tablet (200 mg total) by mouth daily. 06/14/17 07/14/17  [name], MD  Armodafinil (NUVIGIL) 150 MG tablet Take 1 tablet (150 mg total) by mouth daily. As needed. Patient taking differently: Take 150 mg by mouth daily.  06/12/15   Troy Sine, MD  aspirin 81 MG EC tablet Take 81 mg by mouth daily.     [provider]  atorvastatin (LIPITOR) 40 MG tablet Take 1 tablet (40 mg total) by mouth daily. 12/29/16   Aldine Contes, MD  carvedilol (COREG) 12.5 MG tablet Take 1 tablet (12.5 mg total) by mouth 2 (two) times daily with a meal. 06/14/17 07/14/17  [name], MD  Cholecalciferol 2000 units TABS Take 1 tablet (2,000 Units total) by mouth daily. Patient not taking: Reported on 06/06/2017 11/06/16   Riccardo Dubin, MD  DULoxetine (CYMBALTA) 60 MG capsule Take 1 capsule (60 mg total) by mouth daily. 05/30/17 05/30/18  Collier Salina, MD  empagliflozin (JARDIANCE) 10 MG TABS tablet Take 10 mg by mouth daily. 10/07/15   Riccardo Dubin, MD  furosemide (LASIX) 40 MG tablet Take 1 tablet (40 mg total) by mouth 2 (two) times daily. 06/14/17 07/14/17  [name], [name], MD  glucose blood (TRUETRACK TEST) test strip Use as directed to test blood sugar three times a day before meals. 10/04/12   Bertha Stakes, MD  Insulin Glargine (LANTUS SOLOSTAR) 100 UNIT/ML Solostar Pen Inject 56 Units into the skin daily. Patient taking differently: Inject 60 Units into the skin daily.  05/20/17   [name], [name], MD  Insulin Pen Needle (CARETOUCH PEN NEEDLES) 31G X 6 MM MISC 1 pen by Does not apply route at bedtime. 03/01/17   [name],  [name], MD  Ipratropium-Albuterol (COMBIVENT) 20-100 MCG/ACT AERS respimat Inhale 1 puff into the lungs every 6 (six) hours as needed for wheezing or shortness of breath. 01/18/14   Oval Linsey, MD  isosorbide mononitrate (IMDUR) 30 MG 24 hr tablet Take 1 tablet (30 mg total) by mouth daily. 11/02/16   Riccardo Dubin, MD  Lancets MISC Use to test blood sugar three times a day before meals. 09/14/13   Malena Catholic, MD  losartan (COZAAR) 50 MG tablet Take 1 tablet (50 mg total) by mouth daily. 06/15/17 07/15/17  [name], MD  metFORMIN (GLUCOPHAGE) 1000 MG tablet Take 1 tablet (1,000 mg total) by mouth 2 (two) times daily with a meal. 07/20/16   Dellia Nims, MD  Misc. Devices MISC by Does not apply route. C-PAP    [provider]  tamsulosin (FLOMAX) 0.4 MG CAPS capsule Take 1 capsule (0.4 mg total) by mouth daily. 06/14/17 07/14/17  [name], MD   A/P: A drug regimen assessment was performed, including review of allergies, interactions, disease-state management, dosing and immunization history. Medications were reviewed with the patient, including name, instructions, indication, goals of therapy, potential side effects, importance of adherence, and safe use.   Findings/Recommendations: The patient needs an actual prescription for Xarelto as he is currently using samples. A patient assistance application for this medication was filled out during the visit and will be followed closely. The patient will also need a new  prescription for Imdur and Atorvastatin. The Metformin prescription expires in March. A new prescription for each of these medications will be sent to the pharmacy today.   An after visit summary was provided and patient advised to follow up in 2 weeks or sooner if any changes in condition or questions regarding medications arise. Adjustments to his current regimen will be considered based on lab/vitals results at the coming visit.   Goal: Reduce ill burden by  combining Metformin and Jardiance, Losartan and other antihypertensives. Help the patient by applying for [employer] as a way to further bring down the cost of medications.    The patient verbalized understanding of information provided by repeating back concepts discussed.  Hedwig Morton, PharmD Student

## 2017-07-01 ENCOUNTER — Ambulatory Visit (INDEPENDENT_AMBULATORY_CARE_PROVIDER_SITE_OTHER): Payer: Self-pay | Admitting: Physician Assistant

## 2017-07-01 ENCOUNTER — Encounter: Payer: Self-pay | Admitting: Physician Assistant

## 2017-07-01 ENCOUNTER — Other Ambulatory Visit: Payer: Self-pay | Admitting: Physician Assistant

## 2017-07-01 VITALS — BP 140/98 | HR 103 | Ht 73.0 in | Wt 282.6 lb

## 2017-07-01 DIAGNOSIS — I5042 Chronic combined systolic (congestive) and diastolic (congestive) heart failure: Secondary | ICD-10-CM

## 2017-07-01 DIAGNOSIS — I10 Essential (primary) hypertension: Secondary | ICD-10-CM

## 2017-07-01 DIAGNOSIS — I4819 Other persistent atrial fibrillation: Secondary | ICD-10-CM

## 2017-07-01 DIAGNOSIS — G4733 Obstructive sleep apnea (adult) (pediatric): Secondary | ICD-10-CM

## 2017-07-01 DIAGNOSIS — I481 Persistent atrial fibrillation: Secondary | ICD-10-CM

## 2017-07-01 MED ORDER — RIVAROXABAN 20 MG PO TABS: 20.0000 mg | ORAL_TABLET | Freq: Every day | ORAL | 11 refills | Status: DC

## 2017-07-01 NOTE — Progress Notes (Signed)
Cardiology Office Note   Date:  07/01/2017   ID:  Terry Parks, DOB [birth year], MRN [id number]  PCP:  [name], MD  Cardiologist: Dr. Sallyanne Kuster, 06/14/2017 in hospital Rosaria Ferries, PA-C   Chief Complaint  Patient presents with  . Hospitalization Follow-up    pt denied chest pain    History of Present Illness: Terry Parks is a 62 y.o. male with a history of  HTN, HLD, cardiomyopathy (never cathed, MV 2013 w/ ant scar), S-D-CHF, HTN, HLD, DM, BPH, kidney stones, OSA on CPAP, and cardiomegaly  Admitted 1/21-1/28/2019 with CHF exacerbation, AKI 2nd obstruction, and was diagnosed with atrial fib, s/p DCCV>>Aflutter>>amio 200 mg bid and Xarelto started, EF 30-35%, decreased RV function. Wt 291 at discharge  Terry Parks presents for cardiology follow up.   He has been eating [employer] soup and cheese-steaks for meals. He is willing to eat lower Na foods if we give him guidelines.   His scales are weigh off, he has not checked the battery, willing to make sure it is on a level floor.   He is waking with LE edema. He is breathing much better than he was before admission. He can walk up steps, just gets a little winded.   No chest pain.   He is waking with LE edema, but not very much. He still has the catheter in. He is aware he needs to see Urology and get this out, does not yet have appt.   He has not eaten or taken his medications today.   He feels his respiratory status is at baseline, his shortness of breath with exertion is about as good as it gets.  He has not had any chest pain.  He has not had palpitations.  He has not been lightheaded and had presyncope or syncope.    Past Medical History:  Diagnosis Date  . Abscess of groin   . Abscessed tooth   . Allergic rhinitis   . BPH (benign prostatic hypertrophy)    Massive BPH noted on cystoscopy 1/23/ 2012 by Dr. Risa Grill.  . Cardiomegaly    Mild by CXR 11/04/04  . Diabetes mellitus 04/08/2008  .  Headache(784.0)   . Hyperlipemia   . Hypertension   . Hypertensive cardiopathy 03/01/2006   2-D echocardiogram 02/01/2012 showed moderate LVH, mildly to moderately reduced left ventricular systolic function with an estimated ejection fraction of 40-45%, and diffuse hypokinesis.  A nuclear medicine stress study done 01/31/2012 showed no reversible ischemia, a small mid anterior wall fixed defect/infarct, and ejection fraction 42%.      . Neck pain   . Obstructive sleep apnea 03/06/2008   Sleep study 03/06/08 showed severe OSA/hypopnea syndrome, with successful CPAP titration to 13 CWP using a medium ResMed Mirage Quattro full face mask with heated humidifier.   . Renal calculus 05/29/2010   CT scan of abdomen/pelvis on 05/29/2010 showed an obstructing approximate 1-2 mm calculus at the left UVJ, and an approximate 1-2 mm left lower pole renal calculus.   Patient had continuing severe pain , and an elevation of his serum creatinine to a value of 1.75 on 06/06/2010.  The stone had apparently passed and was not seen on repeat CT 06/08/2010.    Past Surgical History:  Procedure Laterality Date  . CARDIOVERSION N/A 06/08/2017   Procedure: CARDIOVERSION;  Surgeon: Josue Hector, MD;  Location: [facility] ENDOSCOPY;  Service: Cardiovascular;  Laterality: N/A;  . CYSTOSCOPY W/ RETROGRADES      Current Outpatient Medications  Medication Sig Dispense Refill  . amiodarone (PACERONE) 200 MG tablet Take 1 tablet (200 mg total) by mouth daily. 30 tablet 0  . aspirin 81 MG EC tablet Take 81 mg by mouth daily.     [name] atorvastatin (LIPITOR) 40 MG tablet Take 1 tablet (40 mg total) by mouth daily. 90 tablet 3  . Azilsartan Medoxomil (EDARBI) 40 MG TABS Take 1 tablet by mouth daily. 30 tablet 11  . carvedilol (COREG) 12.5 MG tablet Take 1 tablet (12.5 mg total) by mouth 2 (two) times daily with a meal. 60 tablet 0  . DULoxetine (CYMBALTA) 60 MG capsule Take 1 capsule (60 mg total) by mouth daily. 90 capsule 1  .  Empagliflozin-Metformin HCl ER (SYNJARDY XR) 09-998 MG TB24 Take 1 tablet by mouth 2 (two) times daily with a meal. 60 tablet 11  . furosemide (LASIX) 40 MG tablet Take 1 tablet (40 mg total) by mouth 2 (two) times daily. 60 tablet 0  . glucose blood (TRUETRACK TEST) test strip Use as directed to test blood sugar three times a day before meals. 100 each 1  . Insulin Glargine (LANTUS SOLOSTAR) 100 UNIT/ML Solostar Pen Inject 56 Units into the skin daily. (Patient taking differently: Inject 60 Units into the skin daily. ) 15 mL 1  . Insulin Pen Needle (CARETOUCH PEN NEEDLES) 31G X 6 MM MISC 1 pen by Does not apply route at bedtime. 100 each 3  . isosorbide mononitrate (IMDUR) 30 MG 24 hr tablet Take 1 tablet (30 mg total) by mouth daily. 90 tablet 3  . Lancets MISC Use to test blood sugar three times a day before meals. 100 each 1  . Misc. Devices MISC by Does not apply route. C-PAP    . rivaroxaban (XARELTO) 20 MG TABS tablet Take 1 tablet (20 mg total) by mouth daily with supper. 30 tablet 11  . tamsulosin (FLOMAX) 0.4 MG CAPS capsule Take 1 capsule (0.4 mg total) by mouth daily. 30 capsule 0   No current facility-administered medications for this visit.     Allergies:   Lisinopril    Social History:  The patient  reports that  has never smoked. he has never used smokeless tobacco. He reports that he does not drink alcohol or use drugs.   Family History:  The patient's family history includes Breast cancer in his mother; Diabetes in his maternal grandmother; Hypertension in his father.    ROS:  Please see the history of present illness. All other systems are reviewed and negative.    PHYSICAL EXAM: VS:  BP (!) 140/98   Pulse (!) 103   Ht 6\' 1"  (1.854 m)   Wt 282 lb 9.6 oz (128.2 kg)   BMI 37.28 kg/m  , BMI Body mass index is 37.28 kg/m. GEN: Well nourished, well developed, male in no acute distress  HEENT: normal for age  Neck: no JVD seen, difficult to assess secondary to body  habitus, no carotid bruit, no masses Cardiac: RRR; no murmur, no rubs, or gallops Respiratory: decreased BS bases bilaterally, normal work of breathing GI: soft, nontender, nondistended, + BS MS: no deformity or atrophy; trace LE edema; distal pulses are 2+ in all 4 extremities   Skin: warm and dry, no rash Neuro:  Strength and sensation are intact Psych: euthymic mood, full affect   EKG:  EKG is ordered today. The ekg ordered today demonstrates ST, HR 103, no acute changes.  Complete ECHO 06/07/2017 Study Conclusions - Left ventricle: The  cavity size was normal. There was severe concentric hypertrophy. Images were inadequate for LV wall motion assessment. The study was not technically sufficient to allow evaluation of LV diastolic dysfunction due to atrial fibrillation. - Aortic valve: Trileaflet; mildly thickened, mildly calcified leaflets. - Mitral valve: There was trivial regurgitation. - Left atrium: The atrium was severely dilated. - Right ventricle: The cavity size was moderately dilated. Wall thickness was normal. Systolic function was mildly to moderately reduced. - Right atrium: The atrium was moderately dilated. - Pulmonic valve: There was trivial regurgitation. - Impressions: Endocardial segments are not adequately visualized to comment on LVF or wall motion. recommend limited echo with definity contrast to determine accurate EF and wall motion. Impressions: - Endocardial segments are not adequately visualized to comment on LVF or wall motion. recommend limited echo with definity contrast to determine accurate EF and wall motion.  Limited ECHO 06/11/2017 Study Conclusions - Left ventricle: The cavity size was normal. There was moderate concentric hypertrophy. Systolic function was moderately to severely reduced. The estimated ejection fraction was in the range of 30% to 35%. Severe diffuse hypokinesis with no identifiable regional  variations. The study is not technically sufficient to allow evaluation of LV diastolic function. Acoustic contrast opacification revealed no evidence ofthrombus. - Left atrium: The atrium was severely dilated. - Right ventricle: Systolic function was reduced. Impressions: - Limited study, no Doppler imaging performed.   Recent Labs: 06/06/2017: B Natriuretic Peptide 991.6; TSH 0.236 06/07/2017: ALT 18 06/08/2017: Magnesium 2.0 06/14/2017: BUN 7; Creatinine, Ser 1.02; Hemoglobin 11.4; Platelets 231; Potassium 3.6; Sodium 140    Lipid Panel    Component Value Date/Time   CHOL 227 (H) 10/07/2015 1042   TRIG 188 (H) 10/07/2015 1042   HDL 30 (L) 10/07/2015 1042   CHOLHDL 7.6 (H) 10/07/2015 1042   CHOLHDL 5.1 07/18/2014 1056   VLDL 17 07/18/2014 1056   LDLCALC 159 (H) 10/07/2015 1042     Wt Readings from Last 3 Encounters:  07/01/17 282 lb 9.6 oz (128.2 kg)  06/21/17 285 lb 11.2 oz (129.6 kg)  06/14/17 292 lb 3.2 oz (132.5 kg)     Other studies Reviewed: Additional studies/ records that were reviewed today include: Hospital records and testing.  ASSESSMENT AND PLAN:  1.  Chronic combined systolic and diastolic CHF: No med changes.  He is encouraged to be more conscious of the sodium in the foods he eats and to try to cook at home sometimes, not eating out all the time.  I showed him the sodium counts and some of the foods that he eats.  I suggested that he get some low-sodium soups and frozen meals to have something he can have at home.    It is a little early to recheck his labs, but he should have them done at his next office visit on 07/12/2017.  2.  Obstructive sleep apnea: He is encouraged to continue to pursue equipment updates and use it nightly.  3.  Persistent atrial fib: He is maintaining sinus rhythm, continue amiodarone 200 mg daily and carvedilol.  He is encouraged not to miss any doses of Xarelto.  4.  Hypertension: His blood pressure is elevated today, but he  states he has not taken his morning medications, compliance is encouraged   Current medicines are reviewed at length with the patient today.  The patient does not have concerns regarding medicines.  The following changes have been made:  no change  Labs/ tests ordered today include:   Orders Placed  This Encounter  Procedures  . Basic metabolic panel  . EKG 12-Lead     Disposition:   FU with Dr. Sallyanne Kuster  Signed, Rosaria Ferries, PA-C  07/01/2017 1:38 PM    [place] Phone: [phone]; Fax: [phone]  This note was written with the assistance of speech recognition software. Please excuse any transcriptional errors.

## 2017-07-01 NOTE — Patient Instructions (Signed)
Continue to weigh daily. Sodium = 500 mg per meal (2000 mg per day) Choose low sodium foods (NOT REDUCED sodium foods) Call [phone] if you gain more than 3 pounds in 24 hours or 5 pounds in 1 week.  Have blood work done at office visit on 07/12/17.   Follow-up with Dr Sallyanne Kuster in 1-2 months.

## 2017-07-01 NOTE — Addendum Note (Signed)
Addended by: Forde Dandy on: 07/01/2017 05:27 PM   Modules accepted: Orders

## 2017-07-01 NOTE — Progress Notes (Signed)
Thank you. Can we please ask him to bring a daily weight log to his next appointment? MCr

## 2017-07-05 ENCOUNTER — Other Ambulatory Visit (INDEPENDENT_AMBULATORY_CARE_PROVIDER_SITE_OTHER): Payer: No Typology Code available for payment source

## 2017-07-05 DIAGNOSIS — R310 Gross hematuria: Secondary | ICD-10-CM

## 2017-07-06 ENCOUNTER — Other Ambulatory Visit: Payer: Self-pay | Admitting: Pharmacist

## 2017-07-06 ENCOUNTER — Telehealth: Payer: Self-pay | Admitting: Cardiovascular Disease

## 2017-07-06 DIAGNOSIS — I4819 Other persistent atrial fibrillation: Secondary | ICD-10-CM

## 2017-07-06 DIAGNOSIS — E1142 Type 2 diabetes mellitus with diabetic polyneuropathy: Secondary | ICD-10-CM

## 2017-07-06 DIAGNOSIS — R3916 Straining to void: Secondary | ICD-10-CM

## 2017-07-06 DIAGNOSIS — I5042 Chronic combined systolic (congestive) and diastolic (congestive) heart failure: Secondary | ICD-10-CM

## 2017-07-06 DIAGNOSIS — E785 Hyperlipidemia, unspecified: Secondary | ICD-10-CM

## 2017-07-06 DIAGNOSIS — E1169 Type 2 diabetes mellitus with other specified complication: Secondary | ICD-10-CM

## 2017-07-06 DIAGNOSIS — R079 Chest pain, unspecified: Secondary | ICD-10-CM

## 2017-07-06 LAB — BMP8+ANION GAP
Anion Gap: 14 mmol/L (ref 10.0–18.0)
BUN/Creatinine Ratio: 7 — ABNORMAL LOW (ref 9–20)
BUN: 7 mg/dL (ref 6–24)
CO2: 25 mmol/L (ref 20–29)
Calcium: 9.1 mg/dL (ref 8.7–10.2)
Chloride: 102 mmol/L (ref 96–106)
Creatinine, Ser: 1.04 mg/dL (ref 0.76–1.27)
GFR calc Af Amer: 90 mL/min/{1.73_m2} (ref >59)
GFR calc non Af Amer: 78 mL/min/{1.73_m2} (ref >59)
Glucose: 96 mg/dL (ref 65–99)
Potassium: 3.9 mmol/L (ref 3.5–5.2)
Sodium: 141 mmol/L (ref 134–144)

## 2017-07-06 MED ORDER — RIVAROXABAN 20 MG PO TABS: 20.0000 mg | ORAL_TABLET | Freq: Every day | ORAL | 3 refills | Status: DC

## 2017-07-06 MED ORDER — FUROSEMIDE 40 MG PO TABS: 40.0000 mg | ORAL_TABLET | Freq: Two times a day (BID) | ORAL | 0 refills | Status: DC

## 2017-07-06 MED ORDER — TAMSULOSIN HCL 0.4 MG PO CAPS: 0.4000 mg | ORAL_CAPSULE | Freq: Every day | ORAL | 0 refills | Status: AC

## 2017-07-06 MED ORDER — CARVEDILOL 12.5 MG PO TABS: 12.5000 mg | ORAL_TABLET | Freq: Two times a day (BID) | ORAL | 0 refills | Status: DC

## 2017-07-06 MED ORDER — AMIODARONE HCL 200 MG PO TABS: 200.0000 mg | ORAL_TABLET | Freq: Every day | ORAL | 0 refills | Status: DC

## 2017-07-06 MED ORDER — DULOXETINE HCL 60 MG PO CPEP: 60.0000 mg | ORAL_CAPSULE | Freq: Every day | ORAL | 1 refills | Status: DC

## 2017-07-06 MED ORDER — ATORVASTATIN CALCIUM 40 MG PO TABS: 40.0000 mg | ORAL_TABLET | Freq: Every day | ORAL | 3 refills | Status: DC

## 2017-07-06 MED ORDER — ISOSORBIDE MONONITRATE ER 30 MG PO TB24: 30.0000 mg | ORAL_TABLET | Freq: Every day | ORAL | 3 refills | Status: DC

## 2017-07-06 NOTE — Telephone Encounter (Signed)
New message  Last seen Suanne Marker barrett 07/01/17     [facility] Pre-operative Risk Assessment    Request for surgical clearance:  1. 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 When is this surgery scheduled? Not scheduled   3. What type of clearance is required (medical clearance vs. Pharmacy clearance to hold med vs. Both)? Medical clearance   4. Are there any medications that need to be held prior to surgery and how long?aspirin   5. Practice name and name of physician performing surgery? Dr bell   6. What is your office phone and fax number? [phone] fax [phone]  7. Anesthesia type (None, local, MAC, general) ? General    Howie Ill 07/06/2017, 11:32 AM  _________________________________________________________________   (provider comments below)

## 2017-07-06 NOTE — Telephone Encounter (Signed)
   Primary Cardiologist: [name], MD  Chart reviewed as part of pre-operative protocol coverage. Given past medical history and time since last visit, based on ACC/AHA guidelines, Terry Parks would be at acceptable risk for the planned procedure without further cardiovascular testing. May hold Hold ASA and Xarelto for 48 hours from cardiology standpoint.   I will route this recommendation to the requesting party via Epic fax function and remove from pre-op pool.  Please call with questions.  Terry Sims DNP, ANP, AACC  07/06/2017, 3:06 PM

## 2017-07-06 NOTE — Progress Notes (Signed)
Lab report, BMP, from 07-05-17 faxed to Dr Link Snuffer, III at [facility] 07-06-2017 @ 11:36  Maryan Rued, PBT 07-06-17  11:37

## 2017-07-06 NOTE — Telephone Encounter (Signed)
Patient with diagnosis of atrial fibrillation on Xarelto for anticoagulation.    Procedure: bladder tumor Date of procedure: tbd  CHADS2-VASc score of  3 (CHF, HTN, DM2)  CrCl 141.2 Platelet count 231  Per office protocol, patient can hold Xarelto for 3 days prior to procedure.   Patient will not need bridging with Lovenox (enoxaparin) around procedure.  Patient should restart Xarelto on the evening of procedure or day after, at discretion of procedure MD

## 2017-07-06 NOTE — Progress Notes (Signed)
Patient enrolled in [facility] [place] pharmacy, prescriptions transferred with exception of azilsartin and empagliflozin-metformin, which patient receives from [facility] through free access program. Follow up appointment scheduled with patient for 07/12/17

## 2017-07-07 ENCOUNTER — Other Ambulatory Visit: Payer: Self-pay | Admitting: Urology

## 2017-07-07 NOTE — Telephone Encounter (Signed)
   Primary Cardiologist: [name], MD  Chart reviewed as part of pre-operative protocol coverage.  Clearance was requested regarding aspirin, but the patient is not on aspirin. See previous note regarding Xarelto.  Terry Parks can hold the Lovenox for 3 days prior to the procedure.  He will not need bridging, restart Xarelto on the evening of the procedure or the day after at discretion of the procedure MD.  [name] is acceptable risk for the planned procedure without further cardiovascular testing.  I will route this recommendation to the requesting party via Epic fax function and remove from pre-op pool.  Please call with questions.  Rosaria Ferries, PA-C 07/07/2017, 1:55 PM

## 2017-07-08 ENCOUNTER — Other Ambulatory Visit: Payer: Self-pay

## 2017-07-08 ENCOUNTER — Encounter (HOSPITAL_COMMUNITY)
Admission: RE | Admit: 2017-07-08 | Discharge: 2017-07-08 | Disposition: A | Discharge status: Home / Self Care | Payer: No Typology Code available for payment source | Source: Ambulatory Visit | Attending: Urology | Admitting: Urology

## 2017-07-08 ENCOUNTER — Encounter (HOSPITAL_COMMUNITY): Payer: Self-pay

## 2017-07-08 ENCOUNTER — Other Ambulatory Visit: Payer: Self-pay | Admitting: Pharmacist

## 2017-07-08 DIAGNOSIS — E1142 Type 2 diabetes mellitus with diabetic polyneuropathy: Secondary | ICD-10-CM

## 2017-07-08 DIAGNOSIS — N401 Enlarged prostate with lower urinary tract symptoms: Secondary | ICD-10-CM | POA: Insufficient documentation

## 2017-07-08 DIAGNOSIS — R310 Gross hematuria: Secondary | ICD-10-CM | POA: Insufficient documentation

## 2017-07-08 HISTORY — DX: Depression, unspecified: F32.A

## 2017-07-08 HISTORY — DX: Heart failure, unspecified: I50.9

## 2017-07-08 HISTORY — DX: Major depressive disorder, single episode, unspecified: F32.9

## 2017-07-08 HISTORY — DX: Presence of urogenital implants: Z96.0

## 2017-07-08 HISTORY — DX: Presence of other specified devices: Z97.8

## 2017-07-08 HISTORY — DX: Cardiomyopathy, unspecified: I42.9

## 2017-07-08 LAB — GLUCOSE, CAPILLARY: Glucose-Capillary: 88 mg/dL (ref 65–99)

## 2017-07-08 MED ORDER — METFORMIN HCL ER 500 MG PO TB24: 1000.0000 mg | ORAL_TABLET | Freq: Two times a day (BID) | ORAL | 3 refills | Status: DC

## 2017-07-08 MED ORDER — BASAGLAR KWIKPEN 100 UNIT/ML ~~LOC~~ SOPN: 60.0000 [IU] | PEN_INJECTOR | Freq: Every day | SUBCUTANEOUS | 3 refills | Status: DC

## 2017-07-08 NOTE — Progress Notes (Signed)
hemaglobin a1c 06-06-17 routed to dr bell by epic

## 2017-07-08 NOTE — Progress Notes (Signed)
EKG-07/01/17-epic  Clearance in NOTES- dated 07/06/2017 LOV-07/01/17-Rhonda barrett,PA-epic  ECHO-epic- 06/07/17 and 06/11/2017 EF-30-35%  CXR- 06/06/17-epic  07/05/17-BMP-epic Cbc-06/14/17-epic  Cardioversion - 06/08/17-epic

## 2017-07-08 NOTE — Progress Notes (Signed)
Transferred insulin and metformin prescriptions to [facility] pharmacy

## 2017-07-08 NOTE — Patient Instructions (Addendum)
Terry Parks  07/08/2017   Your procedure is scheduled on: 07/13/2017   Report to [facility] Main  Entrance            Have a seat in the [place].  Please note there is a phone at the [employer] .  Please call [phone] on that phone.  Someone from Short Stay will come and get you from the Main Lobby and take you to Short Stay.    Call this number if you have problems the morning of surgery [phone]   Remember: Do not eat food or drink liquids :After Midnight.  Bring cpap mask and tubing    Take these medicines the morning of surgery with A SIP OF WATER: Amiodarone ( Pacerone), Flomax, Carvedilol ( Coreg), Cymbalta DO NOT TAKE ANY DIABETIC MEDICATIONS DAY OF YOUR SURGERY                               You may not have any metal on your body including hair pins and              piercings  Do not wear jewelry,  lotions, powders or perfumes, deodorant                         Men may shave face and neck.   Do not bring valuables to the hospital. [place].  Contacts, dentures or bridgework may not be worn into surgery.      Patients discharged the day of surgery will not be allowed to drive home.  Name and phone number of your driver:son dexter cell [phone]                Please read over the following fact sheets you were given: _____________________________________________________________________             [facility] - Preparing for Surgery Before surgery, you can play an important role.  Because skin is not sterile, your skin needs to be as free of germs as possible.  You can reduce the number of germs on your skin by washing with CHG (chlorahexidine gluconate) soap before surgery.  CHG is an antiseptic cleaner which kills germs and bonds with the skin to continue killing germs even after washing. Please DO NOT use if you have an allergy to CHG or antibacterial soaps.   If your skin becomes reddened/irritated stop using the CHG and inform your nurse when you arrive at Short Stay. Do not shave (including legs and underarms) for at least 48 hours prior to the first CHG shower.  You may shave your face/neck. Please follow these instructions carefully:  1.  Shower with CHG Soap the night before surgery and the  morning of Surgery.  2.  If you choose to wash your hair, wash your hair first as usual with your  normal  shampoo.  3.  After you shampoo, rinse your hair and body thoroughly to remove the  shampoo.                           4.  Use CHG as you would any other liquid soap.  You can apply chg  directly  to the skin and wash                       Gently with a scrungie or clean washcloth.  5.  Apply the CHG Soap to your body ONLY FROM THE NECK DOWN.   Do not use on face/ open                           Wound or open sores. Avoid contact with eyes, ears mouth and genitals (private parts).                       Wash face,  Genitals (private parts) with your normal soap.             6.  Wash thoroughly, paying special attention to the area where your surgery  will be performed.  7.  Thoroughly rinse your body with warm water from the neck down.  8.  DO NOT shower/wash with your normal soap after using and rinsing off  the CHG Soap.                9.  Pat yourself dry with a clean towel.            10.  Wear clean pajamas.            11.  Place clean sheets on your bed the night of your first shower and do not  sleep with pets. Day of Surgery : Do not apply any lotions/deodorants the morning of surgery.  Please wear clean clothes to the hospital/surgery center.  FAILURE TO FOLLOW THESE INSTRUCTIONS MAY RESULT IN THE CANCELLATION OF YOUR SURGERY PATIENT SIGNATURE_________________________________  NURSE SIGNATURE__________________________________  ________________________________________________________________________ How to Manage Your Diabetes Before and After  Surgery  Why is it important to control my blood sugar before and after surgery? . Improving blood sugar levels before and after surgery helps healing and can limit problems. . A way of improving blood sugar control is eating a healthy diet by: o  Eating less sugar and carbohydrates o  Increasing activity/exercise o  Talking with your doctor about reaching your blood sugar goals . High blood sugars (greater than 180 mg/dL) can raise your risk of infections and slow your recovery, so you will need to focus on controlling your diabetes during the weeks before surgery. . Make sure that the doctor who takes care of your diabetes knows about your planned surgery including the date and location.  How do I manage my blood sugar before surgery? . Check your blood sugar at least 4 times a day, starting 2 days before surgery, to make sure that the level is not too high or low. o Check your blood sugar the morning of your surgery when you wake up and every 2 hours until you get to the Short Stay unit. . If your blood sugar is less than 70 mg/dL, you will need to treat for low blood sugar: o Do not take insulin. o Treat a low blood sugar (less than 70 mg/dL) with  cup of clear juice (cranberry or apple), 4 glucose tablets, OR glucose gel. o Recheck blood sugar in 15 minutes after treatment (to make sure it is greater than 70 mg/dL). If your blood sugar is not greater than 70 mg/dL on recheck, call [phone] for further instructions. . Report your blood sugar to the short stay nurse when you get  to Short Stay.  . If you are admitted to the hospital after surgery: o Your blood sugar will be checked by the staff and you will probably be given insulin after surgery (instead of oral diabetes medicines) to make sure you have good blood sugar levels. o The goal for blood sugar control after surgery is 80-180 mg/dL.   WHAT DO I DO ABOUT MY DIABETES MEDICATION?   THE DAY BEFORE SURGERY 07-12-17, TAKE YOUR  METFORMIN AS USUAL.  THE NIGHT BEFORE SURGERY, TAKE 1/2 OF OF YOUR LANTUS INSULIN (30 units) . THE MORNING OF SURGERY, DO NOT TAKE YOUR METFORMIN

## 2017-07-12 ENCOUNTER — Other Ambulatory Visit: Payer: Self-pay

## 2017-07-12 ENCOUNTER — Encounter: Payer: Self-pay | Admitting: Internal Medicine

## 2017-07-12 ENCOUNTER — Ambulatory Visit: Payer: No Typology Code available for payment source | Admitting: Pharmacist

## 2017-07-12 ENCOUNTER — Ambulatory Visit (INDEPENDENT_AMBULATORY_CARE_PROVIDER_SITE_OTHER): Payer: No Typology Code available for payment source | Admitting: Internal Medicine

## 2017-07-12 VITALS — BP 164/97 | HR 96 | Temp 97.8°F | Wt 283.2 lb

## 2017-07-12 DIAGNOSIS — N3289 Other specified disorders of bladder: Secondary | ICD-10-CM

## 2017-07-12 DIAGNOSIS — I5042 Chronic combined systolic (congestive) and diastolic (congestive) heart failure: Secondary | ICD-10-CM

## 2017-07-12 DIAGNOSIS — I110 Hypertensive heart disease with heart failure: Secondary | ICD-10-CM

## 2017-07-12 DIAGNOSIS — I481 Persistent atrial fibrillation: Secondary | ICD-10-CM

## 2017-07-12 DIAGNOSIS — N401 Enlarged prostate with lower urinary tract symptoms: Secondary | ICD-10-CM

## 2017-07-12 DIAGNOSIS — Z79899 Other long term (current) drug therapy: Secondary | ICD-10-CM

## 2017-07-12 DIAGNOSIS — Z7901 Long term (current) use of anticoagulants: Secondary | ICD-10-CM

## 2017-07-12 DIAGNOSIS — I4819 Other persistent atrial fibrillation: Secondary | ICD-10-CM

## 2017-07-12 DIAGNOSIS — R338 Other retention of urine: Secondary | ICD-10-CM

## 2017-07-12 DIAGNOSIS — R310 Gross hematuria: Secondary | ICD-10-CM

## 2017-07-12 DIAGNOSIS — E1142 Type 2 diabetes mellitus with diabetic polyneuropathy: Secondary | ICD-10-CM

## 2017-07-12 DIAGNOSIS — I10 Essential (primary) hypertension: Secondary | ICD-10-CM

## 2017-07-12 DIAGNOSIS — Z7984 Long term (current) use of oral hypoglycemic drugs: Secondary | ICD-10-CM

## 2017-07-12 MED ORDER — AMLODIPINE BESYLATE 5 MG PO TABS: 5.0000 mg | ORAL_TABLET | Freq: Every day | ORAL | 2 refills | Status: DC

## 2017-07-12 MED ORDER — DEXTROSE 5 % IV SOLN
3.0000 g | INTRAVENOUS | Status: AC
  Administered 2017-07-13: 3 g via INTRAVENOUS
  Filled 2017-07-12: qty 3

## 2017-07-12 NOTE — Patient Instructions (Signed)
It was a pleasure to see you today Terry Parks. Please make the following changes:  -Please take the medications as indicated below -Please follow up with urology  -Follow up in 2 months  If you have any questions or concerns, please call our clinic at [phone] between 9am-5pm and after hours call [phone] and ask for the internal medicine resident on call. If you feel you are having a medical emergency please call 911.   Thank you, we look forward to help you remain healthy!  [name], MD Internal Medicine PGY1  Heart failure and High blood pressure: Carvedilol 12.5 mg twice daily Lasix 40 mg twice daily Imdur 30 mg daily Edarbi 40mg  daily Amlodipine 5mg  daily  Atrial Fibrillation: Amiodarone 200mg  daily Xarelto  Diabetes: Synjardy (metformin-empagloflozin) 5-1000mg  twice daily  Cholesterol:  Atorvastatin 40mg  daily  Prostate: Tamsulosin 0.4mg  daily

## 2017-07-12 NOTE — Progress Notes (Addendum)
CC: Blood pressure follow up  HPI:  Terry Parks is a 62 y.o. with essential hypertension, chronic combined systolic and diastolic heart failure, atrial fibrillation, and type 2 diabetes mellitus who presented for blood pressure follow up. Please see problem based charting for evaluation, assessment, and plan.  Past Medical History:  Diagnosis Date  . Abscessed tooth    top back large cavity no pain or drainage, one on bottom  pt pulled tooth 4-5 months ago, right top large hole in tooth  . Allergic rhinitis   . BPH (benign prostatic hypertrophy)    Massive BPH noted on cystoscopy 1/23/ 2012 by Dr. Risa Grill.  . Cardiomyopathy ([place])   . CHF (congestive heart failure) ([place])   . Depression   . Diabetes mellitus 04/08/2008   type 2  . Foley catheter in place 07-05-17 placed  . Headache(784.0)    hx migraines none recent  . Hyperlipemia   . Hypertension   . Hypertensive cardiopathy 03/01/2006   2-D echocardiogram 02/01/2012 showed moderate LVH, mildly to moderately reduced left ventricular systolic function with an estimated ejection fraction of 40-45%, and diffuse hypokinesis.  A nuclear medicine stress study done 01/31/2012 showed no reversible ischemia, a small mid anterior wall fixed defect/infarct, and ejection fraction 42%.      . Neck pain   . Obstructive sleep apnea 03/06/2008   Sleep study 03/06/08 showed severe OSA/hypopnea syndrome, with successful CPAP titration to 13 CWP using a medium ResMed Mirage Quattro full face mask with heated humidifier.   . Renal calculus 05/29/2010   CT scan of abdomen/pelvis on 05/29/2010 showed an obstructing approximate 1-2 mm calculus at the left UVJ, and an approximate 1-2 mm left lower pole renal calculus.   Patient had continuing severe pain , and an elevation of his serum creatinine to a value of 1.75 on 06/06/2010.  The stone had apparently passed and was not seen on repeat CT 06/08/2010.   Review of Systems: Denies any weight  gain, edema, shortness of breath, chest pain  Physical Exam:  Vitals:   07/12/17 0920  BP: (!) 164/97  Pulse: 96  Temp: 97.8 F (36.6 C)  TempSrc: Oral  SpO2: 100%  Weight: 283 lb 3.2 oz (128.5 kg)   Physical Exam  Constitutional: He appears well-developed and well-nourished. No distress.  HENT:  Head: Normocephalic and atraumatic.  Eyes: Conjunctivae are normal.  Cardiovascular: Normal rate and normal heart sounds. An irregular rhythm present.  Respiratory: Effort normal and breath sounds normal. No respiratory distress. He has no wheezes.  GI: Soft. Bowel sounds are normal. He exhibits no distension. There is no tenderness.  Musculoskeletal: He exhibits no edema.  Neurological: He is alert.  Skin: He is not diaphoretic.  Psychiatric: He has a normal mood and affect. His behavior is normal. Judgment and thought content normal.    Assessment & Plan:   See Encounters Tab for problem based charting.  Combined chronic systolic and diastolic heart failure The patient states that he is doing well and taking his Lasix 40 mg twice daily.  He has been out of his imdur and would like a refill.  On examination the patient appears to be euvolemic.  Crackles were not heard on lung auscultation, remains at dry weight (283lbs), he does not have any peripheral edema noted.  The patient visited cardiologist on 07/01/2017. No medication changes were made and the patient was told to follow up in 1-2 months.   Plan -Instructed the patient to continue Carvedilol 12.5  mg twice daily, Lasix 40 mg twice daily,Imdur 30 mg daily, Edarbi 40mg  qd -Started amlodipine 5 mg qd to control blood pressure  Atrial Fibrillation Patient is currently taking Amiodarone 200mg  daily and xarelto 20mg  qd.  On auscultation the patient's cardiac rhythm seemed regular.  Diabetes Mellitus Type 2 The patient is currently taking synjardy 5-1000mg  bid. The patient's last a1c=8.1 in 06/06/17. The patient's home blood glucose  measurements over the past month have ranged 80-250. The patient does not note episodes of hypoglycemia. He has not had significant weight changes.   -Continue synjardy 5-1000mg  bid  Bladder mass The patient is following with Dr. Gloriann Loan at [facility] regarding bladder mass seen on renal ultrasound 06/06/2017 urinary retention secondary to BPH.  Patient is scheduled to have cystoscopy and transurethral resection of the prostate on 07/13/2017.  PSA returned elevated at 8.0.  -Instructed patient to continue following with urology -Continue tamsulosin 0.4mg  daily  Essential Hypertension The patient's blood pressure today was 164/97. The patient is currently taking carvedilol 12.5 mg twice daily, Lasix 40 mg twice daily,Imdur 30 mg daily, and losartan 40mg  qd which was changed to edarbi 40mg  qd by health department. The patient's blood pressure is uncontrolled. He was previously on azor and therefore opted to restart amlodipine 5mg  qd.   -started amlodipine 5mg  qd   Patient discussed with Dr. Evette Doffing

## 2017-07-13 ENCOUNTER — Encounter (HOSPITAL_COMMUNITY): Payer: Self-pay | Admitting: Emergency Medicine

## 2017-07-13 ENCOUNTER — Ambulatory Visit (HOSPITAL_COMMUNITY): Payer: Self-pay | Admitting: Anesthesiology

## 2017-07-13 ENCOUNTER — Observation Stay (HOSPITAL_COMMUNITY)
Admission: RE | Admit: 2017-07-13 | Discharge: 2017-07-14 | Disposition: A | Discharge status: Home / Self Care | Payer: Self-pay | Source: Ambulatory Visit | Attending: Urology | Admitting: Urology

## 2017-07-13 ENCOUNTER — Encounter (HOSPITAL_COMMUNITY)
Admission: RE | Disposition: A | Discharge status: Home / Self Care | Payer: Self-pay | Source: Ambulatory Visit | Attending: Urology

## 2017-07-13 DIAGNOSIS — F419 Anxiety disorder, unspecified: Secondary | ICD-10-CM | POA: Insufficient documentation

## 2017-07-13 DIAGNOSIS — G4730 Sleep apnea, unspecified: Secondary | ICD-10-CM | POA: Insufficient documentation

## 2017-07-13 DIAGNOSIS — Z794 Long term (current) use of insulin: Secondary | ICD-10-CM | POA: Insufficient documentation

## 2017-07-13 DIAGNOSIS — I10 Essential (primary) hypertension: Secondary | ICD-10-CM | POA: Insufficient documentation

## 2017-07-13 DIAGNOSIS — F329 Major depressive disorder, single episode, unspecified: Secondary | ICD-10-CM | POA: Insufficient documentation

## 2017-07-13 DIAGNOSIS — Z7901 Long term (current) use of anticoagulants: Secondary | ICD-10-CM | POA: Insufficient documentation

## 2017-07-13 DIAGNOSIS — Z79899 Other long term (current) drug therapy: Secondary | ICD-10-CM | POA: Insufficient documentation

## 2017-07-13 DIAGNOSIS — R310 Gross hematuria: Secondary | ICD-10-CM | POA: Insufficient documentation

## 2017-07-13 DIAGNOSIS — E7800 Pure hypercholesterolemia, unspecified: Secondary | ICD-10-CM | POA: Insufficient documentation

## 2017-07-13 DIAGNOSIS — Z888 Allergy status to other drugs, medicaments and biological substances status: Secondary | ICD-10-CM | POA: Insufficient documentation

## 2017-07-13 DIAGNOSIS — N3289 Other specified disorders of bladder: Secondary | ICD-10-CM | POA: Insufficient documentation

## 2017-07-13 DIAGNOSIS — K219 Gastro-esophageal reflux disease without esophagitis: Secondary | ICD-10-CM | POA: Insufficient documentation

## 2017-07-13 DIAGNOSIS — N400 Enlarged prostate without lower urinary tract symptoms: Secondary | ICD-10-CM | POA: Diagnosis present

## 2017-07-13 DIAGNOSIS — R338 Other retention of urine: Secondary | ICD-10-CM | POA: Insufficient documentation

## 2017-07-13 DIAGNOSIS — E119 Type 2 diabetes mellitus without complications: Secondary | ICD-10-CM | POA: Insufficient documentation

## 2017-07-13 DIAGNOSIS — Z7982 Long term (current) use of aspirin: Secondary | ICD-10-CM | POA: Insufficient documentation

## 2017-07-13 DIAGNOSIS — N401 Enlarged prostate with lower urinary tract symptoms: Principal | ICD-10-CM | POA: Insufficient documentation

## 2017-07-13 DIAGNOSIS — I4891 Unspecified atrial fibrillation: Secondary | ICD-10-CM | POA: Insufficient documentation

## 2017-07-13 DIAGNOSIS — I2510 Atherosclerotic heart disease of native coronary artery without angina pectoris: Secondary | ICD-10-CM | POA: Insufficient documentation

## 2017-07-13 HISTORY — PX: TRANSURETHRAL RESECTION OF BLADDER TUMOR: SHX2575

## 2017-07-13 LAB — GLUCOSE, CAPILLARY
GLUCOSE-CAPILLARY: 134 mg/dL — AB (ref 65–99)
GLUCOSE-CAPILLARY: 257 mg/dL — AB (ref 65–99)
Glucose-Capillary: 130 mg/dL — ABNORMAL HIGH (ref 65–99)
Glucose-Capillary: 212 mg/dL — ABNORMAL HIGH (ref 65–99)
Glucose-Capillary: 287 mg/dL — ABNORMAL HIGH (ref 65–99)

## 2017-07-13 LAB — PSA: Prostate Specific Ag, Serum: 8 ng/mL — ABNORMAL HIGH (ref 0.0–4.0)

## 2017-07-13 LAB — HEMOGLOBIN AND HEMATOCRIT, BLOOD
HCT: 38.1 % — ABNORMAL LOW (ref 39.0–52.0)
HEMOGLOBIN: 12 g/dL — AB (ref 13.0–17.0)

## 2017-07-13 SURGERY — TURBT (TRANSURETHRAL RESECTION OF BLADDER TUMOR)
Anesthesia: General

## 2017-07-13 MED ORDER — LIDOCAINE 2% (20 MG/ML) 5 ML SYRINGE
INTRAMUSCULAR | Status: AC
  Filled 2017-07-13: qty 5

## 2017-07-13 MED ORDER — OXYCODONE HCL 5 MG PO TABS: 5.0000 mg | ORAL_TABLET | Freq: Once | ORAL | Status: DC | PRN

## 2017-07-13 MED ORDER — SODIUM CHLORIDE 0.9 % IR SOLN
3000.0000 mL | Status: DC
  Administered 2017-07-13: 3000 mL

## 2017-07-13 MED ORDER — SODIUM CHLORIDE 0.9 % IV SOLN
INTRAVENOUS | Status: DC
  Administered 2017-07-13 (×2): via INTRAVENOUS

## 2017-07-13 MED ORDER — HYDROCODONE-ACETAMINOPHEN 5-325 MG PO TABS: 1.0000 | ORAL_TABLET | ORAL | 0 refills | Status: DC | PRN

## 2017-07-13 MED ORDER — MIDAZOLAM HCL 2 MG/2ML IJ SOLN
INTRAMUSCULAR | Status: DC | PRN
  Administered 2017-07-13: 2 mg via INTRAVENOUS

## 2017-07-13 MED ORDER — OXYCODONE HCL 5 MG/5ML PO SOLN
5.0000 mg | Freq: Once | ORAL | Status: DC | PRN
  Filled 2017-07-13: qty 5

## 2017-07-13 MED ORDER — FUROSEMIDE 40 MG PO TABS
40.0000 mg | ORAL_TABLET | Freq: Two times a day (BID) | ORAL | Status: DC
  Administered 2017-07-13 – 2017-07-14 (×2): 40 mg via ORAL
  Filled 2017-07-13 (×2): qty 1

## 2017-07-13 MED ORDER — PHENYLEPHRINE 40 MCG/ML (10ML) SYRINGE FOR IV PUSH (FOR BLOOD PRESSURE SUPPORT)
PREFILLED_SYRINGE | INTRAVENOUS | Status: AC
  Filled 2017-07-13: qty 10

## 2017-07-13 MED ORDER — CARVEDILOL 12.5 MG PO TABS
12.5000 mg | ORAL_TABLET | Freq: Two times a day (BID) | ORAL | Status: DC
  Administered 2017-07-13 – 2017-07-14 (×2): 12.5 mg via ORAL
  Filled 2017-07-13 (×2): qty 1

## 2017-07-13 MED ORDER — CARVEDILOL 12.5 MG PO TABS
12.5000 mg | ORAL_TABLET | Freq: Once | ORAL | Status: AC
  Administered 2017-07-13: 12.5 mg via ORAL
  Filled 2017-07-13: qty 1

## 2017-07-13 MED ORDER — ONDANSETRON HCL 4 MG/2ML IJ SOLN
INTRAMUSCULAR | Status: AC
  Filled 2017-07-13: qty 2

## 2017-07-13 MED ORDER — IRBESARTAN 300 MG PO TABS
300.0000 mg | ORAL_TABLET | Freq: Every day | ORAL | Status: DC
  Administered 2017-07-13 – 2017-07-14 (×2): 300 mg via ORAL
  Filled 2017-07-13 (×2): qty 1

## 2017-07-13 MED ORDER — DIPHENHYDRAMINE HCL 12.5 MG/5ML PO ELIX: 12.5000 mg | ORAL_SOLUTION | Freq: Four times a day (QID) | ORAL | Status: DC | PRN

## 2017-07-13 MED ORDER — OXYCODONE HCL 5 MG PO TABS
5.0000 mg | ORAL_TABLET | ORAL | Status: DC | PRN
  Administered 2017-07-13: 5 mg via ORAL
  Filled 2017-07-13 (×2): qty 1

## 2017-07-13 MED ORDER — FENTANYL CITRATE (PF) 100 MCG/2ML IJ SOLN
INTRAMUSCULAR | Status: DC | PRN
  Administered 2017-07-13 (×4): 25 ug via INTRAVENOUS

## 2017-07-13 MED ORDER — ACETAMINOPHEN 325 MG PO TABS: 650.0000 mg | ORAL_TABLET | ORAL | Status: DC | PRN

## 2017-07-13 MED ORDER — MORPHINE SULFATE (PF) 4 MG/ML IV SOLN
2.0000 mg | INTRAVENOUS | Status: DC | PRN
Start: 2017-07-13 — End: 2017-07-14

## 2017-07-13 MED ORDER — AMLODIPINE BESYLATE 5 MG PO TABS
5.0000 mg | ORAL_TABLET | Freq: Every day | ORAL | Status: DC
  Administered 2017-07-13 – 2017-07-14 (×2): 5 mg via ORAL
  Filled 2017-07-13 (×2): qty 1

## 2017-07-13 MED ORDER — AMIODARONE HCL 200 MG PO TABS
200.0000 mg | ORAL_TABLET | Freq: Every day | ORAL | Status: DC
  Administered 2017-07-13 – 2017-07-14 (×2): 200 mg via ORAL
  Filled 2017-07-13 (×2): qty 1

## 2017-07-13 MED ORDER — 0.9 % SODIUM CHLORIDE (POUR BTL) OPTIME
TOPICAL | Status: DC | PRN
  Administered 2017-07-13: 1000 mL

## 2017-07-13 MED ORDER — DULOXETINE HCL 60 MG PO CPEP
60.0000 mg | ORAL_CAPSULE | Freq: Every day | ORAL | Status: DC
  Administered 2017-07-13 – 2017-07-14 (×2): 60 mg via ORAL
  Filled 2017-07-13 (×2): qty 1

## 2017-07-13 MED ORDER — STERILE WATER FOR IRRIGATION IR SOLN
Status: DC | PRN
  Administered 2017-07-13: 500 mL

## 2017-07-13 MED ORDER — OXYBUTYNIN CHLORIDE 5 MG PO TABS: 5.0000 mg | ORAL_TABLET | Freq: Three times a day (TID) | ORAL | Status: DC | PRN

## 2017-07-13 MED ORDER — FENTANYL CITRATE (PF) 100 MCG/2ML IJ SOLN: 25.0000 ug | INTRAMUSCULAR | Status: DC | PRN

## 2017-07-13 MED ORDER — INSULIN ASPART 100 UNIT/ML ~~LOC~~ SOLN
0.0000 [IU] | Freq: Three times a day (TID) | SUBCUTANEOUS | Status: DC
  Administered 2017-07-13: 8 [IU] via SUBCUTANEOUS
  Administered 2017-07-13: 5 [IU] via SUBCUTANEOUS
  Administered 2017-07-14: 3 [IU] via SUBCUTANEOUS

## 2017-07-13 MED ORDER — PHENYLEPHRINE 40 MCG/ML (10ML) SYRINGE FOR IV PUSH (FOR BLOOD PRESSURE SUPPORT)
PREFILLED_SYRINGE | INTRAVENOUS | Status: DC | PRN
  Administered 2017-07-13 (×2): 40 ug via INTRAVENOUS
  Administered 2017-07-13 (×3): 80 ug via INTRAVENOUS
  Administered 2017-07-13: 40 ug via INTRAVENOUS
  Administered 2017-07-13: 80 ug via INTRAVENOUS
  Administered 2017-07-13: 40 ug via INTRAVENOUS

## 2017-07-13 MED ORDER — MIDAZOLAM HCL 2 MG/2ML IJ SOLN
INTRAMUSCULAR | Status: AC
  Filled 2017-07-13: qty 2

## 2017-07-13 MED ORDER — DIPHENHYDRAMINE HCL 50 MG/ML IJ SOLN: 12.5000 mg | Freq: Four times a day (QID) | INTRAMUSCULAR | Status: DC | PRN

## 2017-07-13 MED ORDER — ONDANSETRON HCL 4 MG/2ML IJ SOLN: 4.0000 mg | Freq: Once | INTRAMUSCULAR | Status: DC | PRN

## 2017-07-13 MED ORDER — PROPOFOL 10 MG/ML IV BOLUS
INTRAVENOUS | Status: DC | PRN
  Administered 2017-07-13: 150 mg via INTRAVENOUS

## 2017-07-13 MED ORDER — ONDANSETRON HCL 4 MG/2ML IJ SOLN: 4.0000 mg | INTRAMUSCULAR | Status: DC | PRN

## 2017-07-13 MED ORDER — ISOSORBIDE MONONITRATE ER 30 MG PO TB24
30.0000 mg | ORAL_TABLET | Freq: Every day | ORAL | Status: DC
  Administered 2017-07-13 – 2017-07-14 (×2): 30 mg via ORAL
  Filled 2017-07-13 (×2): qty 1

## 2017-07-13 MED ORDER — PHENYLEPHRINE HCL 10 MG/ML IJ SOLN
INTRAMUSCULAR | Status: AC
  Filled 2017-07-13: qty 1

## 2017-07-13 MED ORDER — SODIUM CHLORIDE 0.9 % IR SOLN
Status: DC | PRN
  Administered 2017-07-13: 39000 mL via INTRAVESICAL

## 2017-07-13 MED ORDER — LIDOCAINE 2% (20 MG/ML) 5 ML SYRINGE
INTRAMUSCULAR | Status: DC | PRN
  Administered 2017-07-13: 100 mg via INTRAVENOUS

## 2017-07-13 MED ORDER — ATORVASTATIN CALCIUM 40 MG PO TABS
40.0000 mg | ORAL_TABLET | Freq: Every day | ORAL | Status: DC
  Administered 2017-07-13 – 2017-07-14 (×2): 40 mg via ORAL
  Filled 2017-07-13 (×2): qty 1

## 2017-07-13 MED ORDER — FENTANYL CITRATE (PF) 100 MCG/2ML IJ SOLN
INTRAMUSCULAR | Status: AC
  Filled 2017-07-13: qty 2

## 2017-07-13 MED ORDER — PROPOFOL 10 MG/ML IV BOLUS
INTRAVENOUS | Status: AC
  Filled 2017-07-13: qty 20

## 2017-07-13 MED ORDER — DEXAMETHASONE SODIUM PHOSPHATE 10 MG/ML IJ SOLN
INTRAMUSCULAR | Status: DC | PRN
  Administered 2017-07-13: 10 mg via INTRAVENOUS

## 2017-07-13 MED ORDER — PHENYLEPHRINE HCL 10 MG/ML IJ SOLN
INTRAVENOUS | Status: DC | PRN
  Administered 2017-07-13: 15 ug/min via INTRAVENOUS

## 2017-07-13 MED ORDER — SENNOSIDES-DOCUSATE SODIUM 8.6-50 MG PO TABS
2.0000 | ORAL_TABLET | Freq: Every day | ORAL | Status: DC
  Administered 2017-07-13: 2 via ORAL
  Filled 2017-07-13: qty 2

## 2017-07-13 MED ORDER — DOCUSATE SODIUM 100 MG PO CAPS
100.0000 mg | ORAL_CAPSULE | Freq: Two times a day (BID) | ORAL | Status: DC
  Administered 2017-07-13 – 2017-07-14 (×3): 100 mg via ORAL
  Filled 2017-07-13 (×3): qty 1

## 2017-07-13 MED ORDER — DEXAMETHASONE SODIUM PHOSPHATE 10 MG/ML IJ SOLN
INTRAMUSCULAR | Status: AC
  Filled 2017-07-13: qty 1

## 2017-07-13 MED ORDER — PHENYLEPHRINE HCL 10 MG/ML IJ SOLN
INTRAMUSCULAR | Status: AC
  Filled 2017-07-13: qty 2

## 2017-07-13 MED ORDER — ONDANSETRON HCL 4 MG/2ML IJ SOLN
INTRAMUSCULAR | Status: DC | PRN
  Administered 2017-07-13: 4 mg via INTRAVENOUS

## 2017-07-13 MED ORDER — BELLADONNA ALKALOIDS-OPIUM 16.2-60 MG RE SUPP: 1.0000 | Freq: Four times a day (QID) | RECTAL | Status: DC | PRN

## 2017-07-13 MED ORDER — LACTATED RINGERS IV SOLN
INTRAVENOUS | Status: DC | PRN
  Administered 2017-07-13 (×2): via INTRAVENOUS

## 2017-07-13 SURGICAL SUPPLY — 17 items
BAG URINE DRAINAGE (UROLOGICAL SUPPLIES) IMPLANT
BAG URO CATCHER STRL LF (MISCELLANEOUS) ×3 IMPLANT
CATH HEMA 3WAY 30CC 24FR COUDE (CATHETERS) ×3 IMPLANT
COVER FOOTSWITCH UNIV (MISCELLANEOUS) IMPLANT
ELECT REM PT RETURN 15FT ADLT (MISCELLANEOUS) ×3 IMPLANT
GLOVE BIOGEL M STRL SZ7.5 (GLOVE) ×3 IMPLANT
GOWN STRL REUS W/TWL LRG LVL3 (GOWN DISPOSABLE) ×3 IMPLANT
LOOP CUT BIPOLAR 24F LRG (ELECTROSURGICAL) ×3 IMPLANT
LOOP MONOPOLAR YLW (ELECTROSURGICAL) ×3 IMPLANT
MANIFOLD NEPTUNE II (INSTRUMENTS) ×3 IMPLANT
PACK CYSTO (CUSTOM PROCEDURE TRAY) ×3 IMPLANT
SET ASPIRATION TUBING (TUBING) IMPLANT
SYR 30ML LL (SYRINGE) ×3 IMPLANT
SYRINGE IRR TOOMEY STRL 70CC (SYRINGE) ×3 IMPLANT
TUBING CONNECTING 10 (TUBING) ×2 IMPLANT
TUBING CONNECTING 10' (TUBING) ×1
TUBING UROLOGY SET (TUBING) ×3 IMPLANT

## 2017-07-13 NOTE — Op Note (Addendum)
Preoperative diagnosis: 1. Bladder tumor 2. Urinary retention secondary to BPH  Postoperative diagnosis:  1. Bladder outlet obstruction secondary to BPH with massive intravesicle median lobe  Procedure:  1. Cystoscopy 2. Transurethral resection of the prostate  Surgeon: Marton Redwood, III. M.D.  Anesthesia: General  Complications: None  EBL: Minimal  Specimens: 1. Prostate chips  Indication: Terry Parks is a 62 year old male who presented to the office with urinary retention and hematuria.  Cystoscopy revealed what appeared to be a papillary mass on the posterior bladder wall versus inflammation from the catheter with somewhat obscured view secondary to hematuria.  He also had a renal ultrasound that revealed a 4 cm potential mass in the bladder.  Given the appearance of bladder mass, the patient presents for transurethral resection.  Description of procedure:  The patient was taken to the operating room and general anesthesia was induced.  The patient was placed in the dorsal lithotomy position, prepped and draped in the usual sterile fashion, and preoperative antibiotics were administered. A preoperative time-out was performed.   Cystourethroscopy was performed.  The patient's urethra was examined and was normal.  Upon entrance into the prostatic urethra, there was significant lateral lobe hypertrophy.  I then entered the bladder and there is massive extension from the prostate that initially appeared to be a bladder tumor versus a massive median lobe.  I therefore proceeded with resection and after starting the resection, it was evident that this was a massive median lobe.  Considering the patient was in retention and having hematuria as well as this large extension into the bladder, the decision was made to continue resection of the median lobe.   The bladder was then systematically examined in its entirety. There was no evidence of any bladder tumors, stones, or other  mucosal pathology.  There was some moderate trabeculation.  The ureteral orifices were obscured by the massive median lobe but there is no evidence that they were resected during the case.  The massive median lobe was resected with bipolar settings.  There was some bleeding and obstruction from the lateral lobes and therefore the lateral lobes were taken down partially as well.  Due to the prolonged time of the procedure as well as bleeding, the entire prostate was not completely resected.  It was much more open than the beginning of the case, however.  Hemostasis was then obtained with electrocautery.  The bladder was emptied and reinspected with no significant bleeding noted at the end of the procedure.  Prostate chips were collected and passed off for specimen  A 24 French 3-way catheter was then placed into the bladder.  Continuous bladder irrigation was initiated.  The patient appeared to tolerate the procedure well and without complications.  The patient was able to be awakened and transferred to the recovery unit in satisfactory condition.

## 2017-07-13 NOTE — Anesthesia Procedure Notes (Signed)
Procedure Name: LMA Insertion Date/Time: 07/13/2017 7:32 AM Performed by: Dione Booze, CRNA Pre-anesthesia Checklist: Emergency Drugs available, Suction available, Patient being monitored and Patient identified Patient Re-evaluated:Patient Re-evaluated prior to induction Oxygen Delivery Method: Circle system utilized Preoxygenation: Pre-oxygenation with 100% oxygen Induction Type: IV induction LMA: LMA inserted LMA Size: 5.0 Number of attempts: 1 Placement Confirmation: positive ETCO2 and breath sounds checked- equal and bilateral Tube secured with: Tape Dental Injury: Teeth and Oropharynx as per pre-operative assessment

## 2017-07-13 NOTE — Progress Notes (Signed)
Pt refusing to ambulate in the halls at this time.  States "I don't have no problem walking." This RN educated on recommendations to ambulate postoperatively to decrease risk for DVTs and pneumonia and pt responded, "I'll see about your recommendations." Will continue to monitor and encourage ambulation. Carnella Guadal[place] I

## 2017-07-13 NOTE — Progress Notes (Addendum)
Pt did not take carvedilol this morning as instructed. Pt takes 12.5mg  BID. Spoke with Dr. Linna Caprice regarding this and received verbal order for 12.5mg  carvedilol prior to surgery. Called pharmacy to send medication.    Also spoke with son Dexter regarding pt not being aware of someone having to stay with pt for 24 hours post surgery.  Dexter is arranging for someone to stay at this time, verbalized understanding and importance of this action.

## 2017-07-13 NOTE — Progress Notes (Signed)
Pt. placed on cpap for h/s, tolerating well, made aware to notify if needed.

## 2017-07-13 NOTE — Assessment & Plan Note (Signed)
The patient is following with Dr. Gloriann Loan at [facility] regarding bladder mass seen on renal ultrasound 06/06/2017 urinary retention secondary to BPH.  Patient is scheduled to have cystoscopy and transurethral resection of the prostate on 07/13/2017.  PSA returned elevated at 8.0.  -Instructed patient to continue following with urology -Continue tamsulosin 0.4mg  daily

## 2017-07-13 NOTE — Assessment & Plan Note (Signed)
Patient is currently taking Amiodarone 200mg  daily and xarelto 20mg  qd.  On auscultation the patient's cardiac rhythm seemed regular.

## 2017-07-13 NOTE — Progress Notes (Signed)
Pt. Set up with cpap for h/s use, humidity added, oxygen line placed/set at 2 lpm, m/ffm, heater off, wants placed on at 2300

## 2017-07-13 NOTE — Anesthesia Preprocedure Evaluation (Addendum)
Anesthesia Evaluation  Patient identified by MRN, date of birth, ID band Patient awake    Reviewed: Allergy & Precautions, NPO status , Patient's Chart, lab work & pertinent test results  Airway Mallampati: II  TM Distance: >3 FB Neck ROM: Full    Dental  (+) Teeth Intact, Dental Advisory Given   Pulmonary    breath sounds clear to auscultation       Cardiovascular hypertension,  Rhythm:Regular Rate:Normal     Neuro/Psych    GI/Hepatic   Endo/Other  diabetes  Renal/GU      Musculoskeletal   Abdominal   Peds  Hematology   Anesthesia Other Findings   Reproductive/Obstetrics                             Anesthesia Physical Anesthesia Plan  ASA: III  Anesthesia Plan: General   Post-op Pain Management:    Induction: Intravenous  PONV Risk Score and Plan: Ondansetron  Airway Management Planned: LMA  Additional Equipment:   Intra-op Plan:   Post-operative Plan:   Informed Consent: I have reviewed the patients History and Physical, chart, labs and discussed the procedure including the risks, benefits and alternatives for the proposed anesthesia with the patient or authorized representative who has indicated his/her understanding and acceptance.     Dental advisory given  Plan Discussed with: CRNA and Anesthesiologist  Anesthesia Plan Comments:         Anesthesia Quick Evaluation  

## 2017-07-13 NOTE — Transfer of Care (Signed)
Immediate Anesthesia Transfer of Care Note  Patient: Terry Parks  Procedure(s) Performed: TRANSURETHRAL RESECTION OF PROSTATE (N/A )  Patient Location: PACU  Anesthesia Type:General  Level of Consciousness: awake and patient cooperative  Airway & Oxygen Therapy: Patient Spontanous Breathing and Patient connected to face mask oxygen  Post-op Assessment: Report given to RN and Post -op Vital signs reviewed and stable  Post vital signs: Reviewed and stable  Last Vitals:  Vitals:   07/13/17 0533 07/13/17 0534  BP:  (!) 164/94  Pulse: (!) 105   Resp: 18   Temp: 36.7 C   SpO2: 99%     Last Pain:  Vitals:   07/13/17 0533  TempSrc: Oral      Patients Stated Pain Goal: 4 (74/12/87 8676)  Complications: No apparent anesthesia complications

## 2017-07-13 NOTE — Assessment & Plan Note (Signed)
The patient states that he is doing well and taking his Lasix 40 mg twice daily.  He has been out of his imdur and would like a refill.  On examination the patient appears to be euvolemic.  Crackles were not heard on lung auscultation, remains at dry weight (283lbs), he does not have any peripheral edema noted.  The patient visited cardiologist on 07/01/2017. No medication changes were made and the patient was told to follow up in 1-2 months.   Plan -Instructed the patient to continue Carvedilol 12.5 mg twice daily, Lasix 40 mg twice daily,Imdur 30 mg daily, Edarbi 40mg  qd -Started amlodipine 5 mg qd to control blood pressure

## 2017-07-13 NOTE — Progress Notes (Signed)
Lab report for PSA faxed 07-13-17 at 09:11 to Dr Link Snuffer, III at [facility] [phone]   Maryan Rued, PBT 07-13-17 09:13

## 2017-07-13 NOTE — Interval H&P Note (Signed)
History and Physical Interval Note:  07/13/2017 7:05 AM  Terry Parks  has presented today for surgery, with the diagnosis of BLADDER MASS  The various methods of treatment have been discussed with the patient and family. After consideration of risks, benefits and other options for treatment, the patient has consented to  Procedure(s): TRANSURETHRAL RESECTION OF BLADDER TUMOR (TURBT) (N/A) as a surgical intervention .  The patient's history has been reviewed, patient examined, no change in status, stable for surgery.  I have reviewed the patient's chart and labs.  Questions were answered to the patient's satisfaction.     Marton Redwood, III

## 2017-07-13 NOTE — Assessment & Plan Note (Signed)
The patient is currently taking synjardy 5-1000mg  bid. The patient's last a1c=8.1 in 06/06/17. The patient's home blood glucose measurements over the past month have ranged 80-250. The patient does not note episodes of hypoglycemia. He has not had significant weight changes.   -Continue synjardy 5-1000mg  bid

## 2017-07-13 NOTE — Discharge Instructions (Addendum)
Transurethral Resection of the Prostate (TURP) or Greenlight laser ablation of the Prostate  Do not start Xarelto/blood thinners back until urine is clear for at least 3 days. Care After  Refer to this sheet in the next few weeks. These discharge instructions provide you with general information on caring for yourself after you leave the hospital. Your caregiver may also give you specific instructions. Your treatment has been planned according to the most current medical practices available, but unavoidable complications sometimes occur. If you have any problems or questions after discharge, please call your caregiver.  HOME CARE INSTRUCTIONS   Medications  You may receive medicine for pain management. As your level of discomfort decreases, adjustments in your pain medicines may be made.   Take all medicines as directed.   You may be given a medicine (antibiotic) to kill germs following surgery. Finish all medicines. Let your caregiver know if you have any side effects or problems from the medicine.   If you are on aspirin, it would be best not to restart the aspirin until the blood in the urine clears Hygiene  You can take a shower after surgery.   You should not take a bath while you still have the urethral catheter. Activity  You will be encouraged to get out of bed as much as possible and increase your activity level as tolerated.   Spend the first week in and around your home. For 3 weeks, avoid the following:   Straining.   Running.   Strenuous work.   Walks longer than a few blocks.   Riding for extended periods.   Sexual relations.   Do not lift heavy objects (more than 20 pounds) for at least 1 month. When lifting, use your arms instead of your abdominal muscles.   You will be encouraged to walk as tolerated. Do not exert yourself. Increase your activity level slowly. Remember that it is important to keep moving after an operation of any type. This cuts down on the  possibility of developing blood clots.   Your caregiver will tell you when you can resume driving and light housework. Discuss this at your first office visit after discharge. Diet  No special diet is ordered after a TURP. However, if you are on a special diet for another medical problem, it should be continued.   Normal fluid intake is usually recommended.   Avoid alcohol and caffeinated drinks for 2 weeks. They irritate the bladder. Decaffeinated drinks are okay.   Avoid spicy foods.  Bladder Function  For the first 10 days, empty the bladder whenever you feel a definite desire. Do not try to hold the urine for long periods of time.   Urinating once or twice a night even after you are healed is not uncommon.   You may see some recurrence of blood in the urine after discharge from the hospital. This usually happens within 2 weeks after the procedure.If this occurs, force fluids again as you did in the hospital and reduce your activity.  Bowel Function  You may experience some constipation after surgery. This can be minimized by increasing fluids and fiber in your diet. Drink enough water and fluids to keep your urine clear or pale yellow.   A stool softener may be prescribed for use at home. Do not strain to move your bowels.   If you are requiring increased pain medicine, it is important that you take stool softeners to prevent constipation. This will help to promote proper healing by  reducing the need to strain to move your bowels.  Sexual Activity  Semen movement in the opposite direction and into the bladder (retrograde ejaculation) may occur. Since the semen passes into the bladder, cloudy urine can occur the first time you urinate after intercourse. Or, you may not have an ejaculation during erection. Ask your caregiver when you can resume sexual activity. Retrograde ejaculation and reduced semen discharge should not reduce one's pleasure of intercourse.  Postoperative  Visit  Arrange the date and time of your after surgery visit with your caregiver.  Return to Work  After your recovery is complete, you will be able to return to work and resume all activities. Your caregiver will inform you when you can return to work.    Foley Catheter Care A soft, flexible tube (Foley catheter) may have been placed in your bladder to drain urine and fluid. Follow these instructions: Taking Care of the Catheter  Keep the area where the catheter leaves your body clean.   Attach the catheter to the leg so there is no tension on the catheter.   Keep the drainage bag below the level of the bladder, but keep it OFF the floor.   Do not take long soaking baths. Your caregiver will give instructions about showering.   Wash your hands before touching ANYTHING related to the catheter or bag.   Using mild soap and warm water on a washcloth:   Clean the area closest to the catheter insertion site using a circular motion around the catheter.   Clean the catheter itself by wiping AWAY from the insertion site for several inches down the tube.   NEVER wipe upward as this could sweep bacteria up into the urethra (tube in your body that normally drains the bladder) and cause infection.   Place a small amount of sterile lubricant at the tip of the penis where the catheter is entering.  Taking Care of the Drainage Bags  Two drainage bags may be taken home: a large overnight drainage bag, and a smaller leg bag which fits underneath clothing.   It is okay to wear the overnight bag at any time, but NEVER wear the smaller leg bag at night.   Keep the drainage bag well below the level of your bladder. This prevents backflow of urine into the bladder and allows the urine to drain freely.   Anchor the tubing to your leg to prevent pulling or tension on the catheter. Use tape or a leg strap provided by the hospital.   Empty the drainage bag when it is 1/2 to 3/4 full. Wash your hands  before and after touching the bag.   Periodically check the tubing for kinks to make sure there is no pressure on the tubing which could restrict the flow of urine.  Changing the Drainage Bags  Cleanse both ends of the clean bag with alcohol before changing.   Pinch off the rubber catheter to avoid urine spillage during the disconnection.   Disconnect the dirty bag and connect the clean one.   Empty the dirty bag carefully to avoid a urine spill.   Attach the new bag to the leg with tape or a leg strap.  Cleaning the Drainage Bags  Whenever a drainage bag is disconnected, it must be cleaned quickly so it is ready for the next use.   Wash the bag in warm, soapy water.   Rinse the bag thoroughly with warm water.   Soak the bag for 30 minutes  in a solution of white vinegar and water (1 cup vinegar to 1 quart warm water).   Rinse with warm water.  SEEK MEDICAL CARE IF:   You have chills or night sweats.   You are leaking around your catheter or have problems with your catheter. It is not uncommon to have sporadic leakage around your catheter as a result of bladder spasms. If the leakage stops, there is not much need for concern. If you are uncertain, call your caregiver.   You develop side effects that you think are coming from your medicines.  SEEK IMMEDIATE MEDICAL CARE IF:   You are suddenly unable to urinate. Check to see if there are any kinks in the drainage tubing that may cause this. If you cannot find any kinks, call your caregiver immediately. This is an emergency.   You develop shortness of breath or chest pains.   Bleeding persists or clots develop in your urine.   You have a fever.   You develop pain in your back or over your lower belly (abdomen).   You develop pain or swelling in your legs.   Any problems you are having get worse rather than better.  MAKE SURE YOU:   Understand these instructions.   Will watch your condition.   Will get help right away  if you are not doing well or get worse.

## 2017-07-13 NOTE — Assessment & Plan Note (Signed)
The patient's blood pressure today was 164/97. The patient is currently taking carvedilol 12.5 mg twice daily, Lasix 40 mg twice daily,Imdur 30 mg daily, and losartan 40mg  qd which was changed to edarbi 40mg  qd by health department. The patient's blood pressure is uncontrolled. He was previously on azor and therefore opted to restart amlodipine 5mg  qd.   -started amlodipine 5mg  qd

## 2017-07-13 NOTE — H&P (Signed)
CC/HPI: CC: Bladder mass  HPI: A 62 year old male who was recently admitted with AKI. He also had atrial fibrillation. Renal ultrasound revealed mild right hydronephrosis and a 5 cm echogenic mass. The patient was having difficulties voiding. He had a Foley catheter placed. He presents today to possibly have the catheter removed. He has been on Flomax. He continues to have some gross hematuria.     ALLERGIES: lisinopril    MEDICATIONS: Metformin Hcl 1,000 mg tablet  Tamsulosin Hcl 0.4 mg capsule  Aspirin Ec 81 mg tablet, delayed release  Atorvastatin Calcium 40 mg tablet  Carvedilol 12.5 mg tablet  Combivent Respimat 20 mcg-100 mcg/actuation mist inhaler  Cozaar  Duloxetine Hcl  Furosemide 40 mg tablet  Imdur  Jardiance  Lantus Solostar  Nuvigil 150 mg tablet  Pacerone 200 mg tablet  Vitamin D3     GU PSH: None   NON-GU PSH: None   GU PMH: None     PMH Notes:  1898-05-17 00:00:00 - Note: Normal Routine History And Physical Adult   NON-GU PMH: Anxiety Coronary Artery Disease Depression Diabetes Type 2 GERD Hypercholesterolemia Hypertension Sleep Apnea    FAMILY HISTORY: None   SOCIAL HISTORY: Marital Status: Unknown Preferred Language: English; Race: Black or African American Current Smoking Status: Patient has never smoked.   Tobacco Use Assessment Completed: Used Tobacco in last 30 days? Drinks 1 caffeinated drink per day. 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     Notes: 4 sons    REVIEW OF SYSTEMS:    GU Review Male:   Patient reports leakage of urine. Patient denies frequent urination, hard to postpone urination, burning/ pain with urination, get up at night to urinate, stream starts and stops, trouble starting your stream, have to strain to urinate , erection problems, and penile pain.  Gastrointestinal (Upper):   Patient denies nausea, vomiting, and indigestion/ heartburn.  Gastrointestinal (Lower):   Patient denies diarrhea and constipation.   Constitutional:   Patient reports weight loss. Patient denies fatigue, night sweats, and fever.  Skin:   Patient denies skin rash/ lesion and itching.  Eyes:   Patient denies blurred vision and double vision.  Ears/ Nose/ Throat:   Patient denies sore throat and sinus problems.  Hematologic/Lymphatic:   Patient denies swollen glands and easy bruising.  Cardiovascular:   Patient reports leg swelling. Patient denies chest pains.  Respiratory:   Patient denies cough and shortness of breath.  Endocrine:   Patient denies excessive thirst.  Musculoskeletal:   Patient denies back pain and joint pain.  Neurological:   Patient denies headaches and dizziness.  Psychologic:   Patient reports depression and anxiety.    VITAL SIGNS:      07/05/2017 12:29 PM  Weight 282 lb / 127.91 kg  Height 73 in / 185.42 cm  BP 144/89 mmHg  Pulse 98 /min  Temperature 97.3 F / 36.2 C  BMI 37.2 kg/m   GU PHYSICAL EXAMINATION:    Anus and Perineum: No hemorrhoids. No anal stenosis. No rectal fissure, no anal fissure. No edema, no dimple, no perineal tenderness, no anal tenderness.  Scrotum: No lesions. No edema. No cysts. No warts.  Epididymides: Right: no spermatocele, no masses, no cysts, no tenderness, no induration, no enlargement. Left: no spermatocele, no masses, no cysts, no tenderness, no induration, no enlargement.  Testes: No tenderness, no swelling, no enlargement left testes. No tenderness, no swelling, no enlargement right testes. Normal location left testes. Normal location right testes. No mass, no cyst, no varicocele, no  hydrocele left testes. No mass, no cyst, no varicocele, no hydrocele right testes.  Urethral Meatus: Normal size. No lesion, no wart, no discharge, no polyp. Normal location.  Penis: Circumcised, no warts, no cracks. No dorsal Peyronie's plaques, no left corporal Peyronie's plaques, no right corporal Peyronie's plaques, no scarring, no warts. No balanitis, no meatal stenosis.   Prostate: Difficult to palpate secondary to body habitus however within limitation, I did not feel any firmness or nodularity. I can only feel the very tip of the prostate however  Seminal Vesicles: Nonpalpable.  Sphincter Tone: Normal sphincter. No rectal tenderness. No rectal mass.    MULTI-SYSTEM PHYSICAL EXAMINATION:    Constitutional: Well-nourished. No physical deformities. Normally developed. Good grooming.  Neck: Neck symmetrical, not swollen. Normal tracheal position.  Respiratory: No labored breathing, no use of accessory muscles.   Cardiovascular: Normal temperature, normal extremity pulses, no swelling, no varicosities.  Lymphatic: No enlargement of neck, axillae, groin.  Skin: No paleness, no jaundice, no cyanosis. No lesion, no ulcer, no rash.  Neurologic / Psychiatric: Oriented to time, oriented to place, oriented to person. No depression, no anxiety, no agitation.  Gastrointestinal: No mass, no tenderness, no rigidity, non obese abdomen.  Eyes: Normal conjunctivae. Normal eyelids.  Ears, Nose, Mouth, and Throat: Left ear no scars, no lesions, no masses. Right ear no scars, no lesions, no masses. Nose no scars, no lesions, no masses. Normal hearing. Normal lips.  Musculoskeletal: Normal gait and station of head and neck.     PAST DATA REVIEWED:  Source Of History:  Patient   PROCEDURES:         Flexible Cystoscopy - 52000  Risks, benefits, and some of the potential complications of the procedure were discussed at length with the patient including infection, bleeding, voiding discomfort, urinary retention, fever, chills, sepsis, and others. All questions were answered. Informed consent was obtained. Antibiotic prophylaxis was given. Sterile technique and intraurethral analgesia were used.  Meatus:  Normal size. Normal location. Normal condition.  Urethra:  No strictures.  External Sphincter:  Normal.  Verumontanum:  Normal.  Prostate:  Obstructing. Moderate hyperplasia.  Moderate median lobe. About 4 cm  Bladder Neck:  Non-obstructing.  Bladder:  There is about a 5 cm area on the posterior bladder wall with mass papillary appearance. Unable to definitively say catheter edema versus bladder tumor. Certainly looked more papillary then the typical appearance Of catheter edema      The lower urinary tract was carefully examined. The procedure was well-tolerated and without complications. Antibiotic instructions were given. Instructions were given to call the office immediately for bloody urine, difficulty urinating, urinary retention, painful or frequent urination, fever, chills, nausea, vomiting or other illness. The patient stated that he understood these instructions and would comply with them.         Catheter / SP Tube - [id number] Simple Foley Catheterization  A 18 French COUDE Foley catheter was inserted into the bladder using sterile technique. UROJET prior. Small amount of bleeding noted once foley was in place-- The patient was taught routine catheter care. A leg bag was connected. 300 cc of urine was obtained.   ASSESSMENT:      ICD-10 Details  1 GU:   Urinary Retention - R33.8   2   Gross hematuria - R31.0    PLAN:           Orders Labs BMP          Schedule Labs: 1 Week - PSA  X-Rays: 1  Week - C.T. Hematuria With and Without I.V. Contrast          Document Letter(s):  Created for Patient: Clinical Summary         Notes:   To complete hematuria workup, I will obtain a CT IVP   Procedure operating room for transurethral resection of bladder tumor. Since the risks including but not limited to bleeding, infection, injury to surrounding structures including possible perforation of bladder.   PSA in one week.     Signed by Link Snuffer, III, M.D. on 07/05/17 at 3:28 PM (EST

## 2017-07-14 LAB — HEMOGLOBIN AND HEMATOCRIT, BLOOD
HEMATOCRIT: 35.1 % — AB (ref 39.0–52.0)
Hemoglobin: 11 g/dL — ABNORMAL LOW (ref 13.0–17.0)

## 2017-07-14 LAB — GLUCOSE, CAPILLARY: GLUCOSE-CAPILLARY: 178 mg/dL — AB (ref 65–99)

## 2017-07-14 NOTE — Discharge Summary (Signed)
Physician Discharge Summary  Patient ID: Terry Parks MRN: [id number] DOB/AGE: 08/07/1955 62 y.o.  Admit date: 07/13/2017 Discharge date: 07/14/2017  Admission Diagnoses:  Discharge Diagnoses:  Active Problems:   BPH (benign prostatic hyperplasia)   Discharged Condition: good  Hospital Course: 62 year old male with urinary retention who was found to have a possible bladder mass and therefore went to the operating room for a TURBT and was found to have a massive intravesical median lobe about 5 cm in size and subsequently underwent transurethral resection of this as well as partial transurethral resection of the lateral lobes of the prostate.  He remained overnight for continuous bladder irrigation and was doing well the following day with urine light pink off CBI.  Consults: None  Significant Diagnostic Studies: None  Treatments: surgery: TURP  Discharge Exam: Blood pressure 139/80, pulse 72, temperature 98.2 F (36.8 C), temperature source Oral, resp. rate 18, height 6\' 1"  (1.854 m), weight 129.5 kg (285 lb 7.9 oz), SpO2 100 %. General appearance: alert no acute distress Abdomen soft nontender nondistended Urine light pink off CBI   Disposition: 01-Home or Self Care   Allergies as of 07/14/2017      Reactions   Lisinopril Cough      Medication List    STOP taking these medications   rivaroxaban 20 MG Tabs tablet Commonly known as:  XARELTO     TAKE these medications   amiodarone 200 MG tablet Commonly known as:  PACERONE Take 1 tablet (200 mg total) by mouth daily.   amLODipine 5 MG tablet Commonly known as:  NORVASC Take 1 tablet (5 mg total) by mouth daily.   atorvastatin 40 MG tablet Commonly known as:  LIPITOR Take 1 tablet (40 mg total) by mouth daily.   Azilsartan Medoxomil 40 MG Tabs Commonly known as:  EDARBI Take 1 tablet by mouth daily.   BASAGLAR KWIKPEN 100 UNIT/ML Sopn Inject 0.6 mLs (60 Units total) into the skin daily. What changed:   when to take this   carvedilol 12.5 MG tablet Commonly known as:  COREG Take 1 tablet (12.5 mg total) by mouth 2 (two) times daily with a meal.   DULoxetine 60 MG capsule Commonly known as:  CYMBALTA Take 1 capsule (60 mg total) by mouth daily.   Empagliflozin-metFORMIN HCl ER 09-998 MG Tb24 Commonly known as:  SYNJARDY XR Take 1 tablet by mouth 2 (two) times daily with a meal.   furosemide 40 MG tablet Commonly known as:  LASIX Take 1 tablet (40 mg total) by mouth 2 (two) times daily.   glucose blood test strip Commonly known as:  TRUETRACK TEST Use as directed to test blood sugar three times a day before meals.   HYDROcodone-acetaminophen 5-325 MG tablet Commonly known as:  NORCO Take 1 tablet by mouth every 4 (four) hours as needed for moderate pain.   Insulin Pen Needle 31G X 6 MM Misc Commonly known as:  CARETOUCH PEN NEEDLES 1 pen by Does not apply route at bedtime.   isosorbide mononitrate 30 MG 24 hr tablet Commonly known as:  IMDUR Take 1 tablet (30 mg total) by mouth daily.   Lancets Misc Use to test blood sugar three times a day before meals.   metFORMIN 1000 MG tablet Commonly known as:  GLUCOPHAGE Take 1,000 mg by mouth 2 (two) times daily with a meal.   Misc. Devices Misc by Does not apply route. C-PAP   tamsulosin 0.4 MG Caps capsule Commonly known as:  FLOMAX Take  1 capsule (0.4 mg total) by mouth daily.        Signed: Marton Redwood, III 07/14/2017, 7:57 AM

## 2017-07-14 NOTE — Care Management Note (Signed)
Case Management Note  Patient Details  Name: Ric Rosenberg MRN: [id number] Date of Birth: December 27, 1955  Subjective/Objective:No CM needs.                    Action/Plan:d/c home.   Expected Discharge Date:  07/14/17               Expected Discharge Plan:  Home/Self Care  In-House Referral:     Discharge planning Services  CM Consult  Post Acute Care Choice:    Choice offered to:     DME Arranged:    DME Agency:     HH Arranged:    HH Agency:     Status of Service:  Completed, signed off  If discussed at [employer] of Stay Meetings, dates discussed:    Additional Comments:  Dessa Phi, RN 07/14/2017, 10:50 AM

## 2017-07-14 NOTE — Progress Notes (Signed)
Internal Medicine Clinic Attending  Case discussed with Dr. [name] at the time of the visit.  We reviewed the resident's history and exam and pertinent patient test results.  I agree with the assessment, diagnosis, and plan of care documented in the resident's note. 

## 2017-07-14 NOTE — Anesthesia Postprocedure Evaluation (Signed)
Anesthesia Post Note  Patient: Per Beagley  Procedure(s) Performed: TRANSURETHRAL RESECTION OF PROSTATE (N/A )     Patient location during evaluation: PACU Anesthesia Type: General Level of consciousness: awake and alert Pain management: pain level controlled Vital Signs Assessment: post-procedure vital signs reviewed and stable Respiratory status: spontaneous breathing, nonlabored ventilation, respiratory function stable and patient connected to nasal cannula oxygen Cardiovascular status: blood pressure returned to baseline and stable Postop Assessment: no apparent nausea or vomiting Anesthetic complications: no    Last Vitals:  Vitals:   07/14/17 0114 07/14/17 0557  BP: 136/76 139/80  Pulse: 83 72  Resp: 18 18  Temp: 36.7 C 36.8 C  SpO2: 97% 100%    Last Pain:  Vitals:   07/14/17 0715  TempSrc:   PainSc: 0-No pain                 Lerone Onder COKER

## 2017-07-22 ENCOUNTER — Telehealth: Payer: Self-pay | Admitting: *Deleted

## 2017-07-22 NOTE — Telephone Encounter (Signed)
   [facility] Pre-operative Risk Assessment    Request for surgical clearance:  1. What type of surgery is being performed? Prostate Biopsy   2. When is this surgery scheduled? 08/04/17   3. What type of clearance is required (medical clearance vs. Pharmacy clearance to hold med vs. Both)? Both  4. Are there any medications that need to be held prior to surgery and how long?Xarelto and Aspirin, requesting 5 days prior.   5. Practice name and name of physician performing surgery? [facility], Dr. Gloriann Loan   6. What is your office phone and fax number? Phone: [phone], Fax: [phone]   7. Anesthesia type (None, local, MAC, general) ? None listed   Ricci Barker 07/22/2017, 5:29 PM  _________________________________________________________________   (provider comments below)

## 2017-07-25 NOTE — Telephone Encounter (Signed)
Please clarify anticoagulation - none listed on current med list, Xarelto appears to have been discontinued on discharge summary from 2/28 and do not see that this has been restarted.  ASA clearance per pre-op pool.

## 2017-07-26 ENCOUNTER — Other Ambulatory Visit: Payer: Self-pay

## 2017-07-26 ENCOUNTER — Ambulatory Visit: Payer: Self-pay | Admitting: Pharmacist

## 2017-07-26 ENCOUNTER — Encounter (HOSPITAL_COMMUNITY): Payer: Self-pay | Admitting: Emergency Medicine

## 2017-07-26 ENCOUNTER — Emergency Department (HOSPITAL_COMMUNITY)
Admission: EM | Admit: 2017-07-26 | Discharge: 2017-07-26 | Disposition: A | Discharge status: Home / Self Care | Payer: No Typology Code available for payment source | Attending: Emergency Medicine | Admitting: Emergency Medicine

## 2017-07-26 DIAGNOSIS — T83098A Other mechanical complication of other indwelling urethral catheter, initial encounter: Secondary | ICD-10-CM | POA: Insufficient documentation

## 2017-07-26 DIAGNOSIS — I5042 Chronic combined systolic (congestive) and diastolic (congestive) heart failure: Secondary | ICD-10-CM | POA: Insufficient documentation

## 2017-07-26 DIAGNOSIS — E1140 Type 2 diabetes mellitus with diabetic neuropathy, unspecified: Secondary | ICD-10-CM | POA: Insufficient documentation

## 2017-07-26 DIAGNOSIS — Z79899 Other long term (current) drug therapy: Secondary | ICD-10-CM | POA: Insufficient documentation

## 2017-07-26 DIAGNOSIS — I110 Hypertensive heart disease with heart failure: Secondary | ICD-10-CM | POA: Insufficient documentation

## 2017-07-26 DIAGNOSIS — R338 Other retention of urine: Secondary | ICD-10-CM

## 2017-07-26 DIAGNOSIS — Z794 Long term (current) use of insulin: Secondary | ICD-10-CM | POA: Insufficient documentation

## 2017-07-26 DIAGNOSIS — Y69 Unspecified misadventure during surgical and medical care: Secondary | ICD-10-CM | POA: Insufficient documentation

## 2017-07-26 NOTE — ED Notes (Addendum)
Patient seen and examined by Dr. Venora Maples at Triage Rm.1 , existing obstructed foley catheter removed and replaced by a new Fr.16 foley catheter , drained 1,200 ml of urine at drainage bag , leg bag applied . Pt. articulated relief , bladder pressure/pain relieved .

## 2017-07-26 NOTE — ED Triage Notes (Signed)
Patient reports urinary retention/ foley catheter obstructed , no urine output at leg bag since this morning with bladder pressure /bladder pain .

## 2017-07-26 NOTE — ED Provider Notes (Signed)
[place] EMERGENCY DEPARTMENT Provider Note   CSN: [id number] Arrival date & time: 07/26/17  2141     History   Chief Complaint Chief Complaint  Patient presents with  . Urinary Retention  . Foley Catheter Obstructed    HPI Terry Parks is a 62 y.o. male.  HPI 62 yo male s/p TURP and resection of bladder tumor on 07/13/17. Presents with acute urinary retention with catheter in place. Some blood noted in the catheter yesterday. Emptied the foley bag this morning and has made no urine since then. No fevers. No nausea or vomiting.    Past Medical History:  Diagnosis Date  . Abscessed tooth    top back large cavity no pain or drainage, one on bottom  pt pulled tooth 4-5 months ago, right top large hole in tooth  . Allergic rhinitis   . BPH (benign prostatic hypertrophy)    Massive BPH noted on cystoscopy 1/23/ 2012 by Dr. Risa Grill.  . Cardiomyopathy ([place])   . CHF (congestive heart failure) ([place])   . Depression   . Diabetes mellitus 04/08/2008   type 2  . Foley catheter in place 07-05-17 placed  . Headache(784.0)    hx migraines none recent  . Hyperlipemia   . Hypertension   . Hypertensive cardiopathy 03/01/2006   2-D echocardiogram 02/01/2012 showed moderate LVH, mildly to moderately reduced left ventricular systolic function with an estimated ejection fraction of 40-45%, and diffuse hypokinesis.  A nuclear medicine stress study done 01/31/2012 showed no reversible ischemia, a small mid anterior wall fixed defect/infarct, and ejection fraction 42%.      . Neck pain   . Obstructive sleep apnea 03/06/2008   Sleep study 03/06/08 showed severe OSA/hypopnea syndrome, with successful CPAP titration to 13 CWP using a medium ResMed Mirage Quattro full face mask with heated humidifier.   . Renal calculus 05/29/2010   CT scan of abdomen/pelvis on 05/29/2010 showed an obstructing approximate 1-2 mm calculus at the left UVJ, and an approximate 1-2 mm left lower pole  renal calculus.   Patient had continuing severe pain , and an elevation of his serum creatinine to a value of 1.75 on 06/06/2010.  The stone had apparently passed and was not seen on repeat CT 06/08/2010.    Patient Active Problem List   Diagnosis Date Noted  . BPH (benign prostatic hyperplasia) 07/13/2017  . Bladder mass 07/13/2017  . AKI (acute kidney injury) ([place])   . Elevated troponin   . Persistent atrial fibrillation ([place])   . Acute renal failure (ARF) ([place]) 06/06/2017  . Acute on chronic combined systolic (congestive) and diastolic (congestive) heart failure ([place]) 06/06/2017  . Urinary straining 11/02/2016  . Encounter for HCV screening test for low risk patient 11/02/2016  . Chest pain 11/02/2016  . Vitamin D deficiency 06/22/2016  . Neuropathy in diabetes ([place]) 10/07/2015  . Erectile dysfunction 04/17/2014  . Rash 04/17/2014  . Allergic rhinitis 10/19/2012  . Diabetic retinopathy ([place]) 10/04/2012  . Cough 03/30/2012  . BPH   . Nephrolithiasis 05/29/2010  . Type 2 diabetes mellitus with peripheral neuropathy ([place]) 04/08/2008  . Obstructive sleep apnea   . Morbid obesity ([place])   . Hyperlipidemia associated with type 2 diabetes mellitus ([place])   . Essential hypertension   . Chronic combined systolic and diastolic CHF (congestive heart failure) ([place]) 03/01/2006    Past Surgical History:  Procedure Laterality Date  . big toe nails removed Bilateral 20 yrs ago  . CARDIOVERSION N/A 06/08/2017  Procedure: CARDIOVERSION;  Surgeon: Josue Hector, MD;  Location: [facility]COPY;  Service: Cardiovascular;  Laterality: N/A;  . CYSTOSCOPY W/ RETROGRADES    . TRANSURETHRAL RESECTION OF BLADDER TUMOR N/A 07/13/2017   Procedure: TRANSURETHRAL RESECTION OF PROSTATE;  Surgeon: Lucas Mallow, MD;  Location: [facility] ORS;  Service: Urology;  Laterality: N/A;       Home Medications    Prior to Admission medications   Medication Sig Start Date End Date Taking? Authorizing Provider    amiodarone (PACERONE) 200 MG tablet Take 1 tablet (200 mg total) by mouth daily. 07/06/17 08/05/17  Forde Dandy, PharmD  amLODipine (NORVASC) 5 MG tablet Take 1 tablet (5 mg total) by mouth daily. 07/12/17 07/12/18  [name], MD  atorvastatin (LIPITOR) 40 MG tablet Take 1 tablet (40 mg total) by mouth daily. 07/06/17   Forde Dandy, PharmD  Azilsartan Medoxomil (EDARBI) 40 MG TABS Take 1 tablet by mouth daily. 06/28/17   [name], MD  carvedilol (COREG) 12.5 MG tablet Take 1 tablet (12.5 mg total) by mouth 2 (two) times daily with a meal. 07/06/17 08/05/17  Forde Dandy, PharmD  DULoxetine (CYMBALTA) 60 MG capsule Take 1 capsule (60 mg total) by mouth daily. 07/06/17 07/06/18  Forde Dandy, PharmD  Empagliflozin-Metformin HCl ER (SYNJARDY XR) 09-998 MG TB24 Take 1 tablet by mouth 2 (two) times daily with a meal. 06/28/17   [name], [name], MD  furosemide (LASIX) 40 MG tablet Take 1 tablet (40 mg total) by mouth 2 (two) times daily. 07/06/17 08/05/17  Forde Dandy, PharmD  glucose blood (TRUETRACK TEST) test strip Use as directed to test blood sugar three times a day before meals. 10/04/12   Bertha Stakes, MD  HYDROcodone-acetaminophen (NORCO) 5-325 MG tablet Take 1 tablet by mouth every 4 (four) hours as needed for moderate pain. 07/13/17   Lucas Mallow, MD  Insulin Glargine (BASAGLAR KWIKPEN) 100 UNIT/ML SOPN Inject 0.6 mLs (60 Units total) into the skin daily. Patient taking differently: Inject 60 Units into the skin at bedtime.  07/08/17   [name], [name], MD  Insulin Pen Needle (CARETOUCH PEN NEEDLES) 31G X 6 MM MISC 1 pen by Does not apply route at bedtime. 03/01/17   [name], [name], MD  isosorbide mononitrate (IMDUR) 30 MG 24 hr tablet Take 1 tablet (30 mg total) by mouth daily. 07/06/17   Forde Dandy, PharmD  Lancets MISC Use to test blood sugar three times a day before meals. 09/14/13   Malena Catholic, MD  metFORMIN (GLUCOPHAGE) 1000 MG tablet Take 1,000 mg by mouth  2 (two) times daily with a meal.    [provider]  Misc. Devices MISC by Does not apply route. C-PAP    [provider]  tamsulosin (FLOMAX) 0.4 MG CAPS capsule Take 1 capsule (0.4 mg total) by mouth daily. 07/06/17 08/05/17  Forde Dandy, PharmD    Family History Family History  Problem Relation Age of Onset  . Breast cancer Mother   . Hypertension Father   . Diabetes Maternal Grandmother   . Colon cancer Neg Hx   . Prostate cancer Neg Hx   . Heart attack Neg Hx     Social History Social History   Tobacco Use  . Smoking status: Never Smoker  . Smokeless tobacco: Never Used  Substance Use Topics  . Alcohol use: No    Alcohol/week: 0.0 oz  . Drug use: No     Allergies  Lisinopril   Review of Systems Review of Systems  All other systems reviewed and are negative.    Physical Exam Updated Vital Signs BP (!) 188/118 (BP Location: Right Arm) Comment: Pt unable to sit still due to pain.   Pulse (!) 103   Temp (!) 97.5 F (36.4 C) (Oral)   Resp 20   Ht 6\' 1"  (1.854 m)   Wt 125.6 kg (277 lb)   SpO2 100%   BMI 36.55 kg/m   Physical Exam  Constitutional: He is oriented to person, place, and time. He appears well-developed and well-nourished.  HENT:  Head: Normocephalic.  Eyes: EOM are normal.  Neck: Normal range of motion.  Pulmonary/Chest: Effort normal.  Abdominal: He exhibits no distension.  Suprapubic tenderness  Genitourinary:  Genitourinary Comments: Normal penis. Catheter in place. No urine in foley bag  Musculoskeletal: Normal range of motion.  Neurological: He is alert and oriented to person, place, and time.  Psychiatric: He has a normal mood and affect.  Nursing note and vitals reviewed.    ED Treatments / Results  Labs (all labs ordered are listed, but only abnormal results are displayed) Labs Reviewed - No data to display  EKG  EKG Interpretation None       Radiology No results found.  Procedures Procedures  (including critical care time)  Medications Ordered in ED Medications - No data to display   Initial Impression / Assessment and Plan / ED Course  I have reviewed the triage vital signs and the nursing notes.  Pertinent labs & imaging results that were available during my care of the patient were reviewed by me and considered in my medical decision making (see chart for details).     Catheter exchanged by nursing staff. approx 1400cc of blood tinged urine returned. Feels much better at this time. Dc home with urology follow up  Final Clinical Impressions(s) / ED Diagnoses   Final diagnoses:  None    ED Discharge Orders    None       Jola Schmidt, MD 07/26/17 2225

## 2017-07-26 NOTE — Telephone Encounter (Signed)
Pt was cleared for TURP and his Xarelto was stopped at discharge, he still had pink urine - now needs prostate biopsy 3/21 have sent Dr. Sallyanne Kuster a message concerning plan for xarelto.

## 2017-07-27 NOTE — Telephone Encounter (Signed)
I would keep off Xarelto until he has the prostate biopsy. Resume 48 h after biopsy unless instructed otherwise by his Urologist due to bleeding complications

## 2017-07-27 NOTE — Telephone Encounter (Signed)
I will route this recommendation to the requesting party via Epic fax function and remove from pre-op pool. Per our record, The patient is not on ASA.   Please call with questions.  [place], [place] 07/27/2017, 2:51 PM

## 2017-08-02 ENCOUNTER — Telehealth: Payer: Self-pay | Admitting: Cardiovascular Disease

## 2017-08-02 NOTE — Telephone Encounter (Signed)
New message    Jacob Moores from [facility] calling to request pre op clearance letter be faxed.  Practice name and name of physician performing surgery? [facility], Dr. Gloriann Loan   What is your office phone and fax number? Phone: [phone], Fax: [phone] ATTN: Jacob Moores

## 2017-08-02 NOTE — Telephone Encounter (Signed)
Clearance refaxed via Epic.

## 2017-08-02 NOTE — Telephone Encounter (Signed)
Left message for Terry Parks at [facility]. Advised to call back if clearance not received.

## 2017-08-16 ENCOUNTER — Telehealth: Payer: Self-pay | Admitting: Cardiovascular Disease

## 2017-08-16 NOTE — Telephone Encounter (Signed)
New Message     [facility] Pre-operative Risk Assessment    Request for surgical clearance:  1. What type of surgery is being performed? Prostatectomy   2. When is this surgery scheduled? n/a  3. What type of clearance is required (medical clearance vs. Pharmacy clearance to hold med vs. Both)? both  4. Are there any medications that need to be held prior to surgery and how long? xarelto  5. Practice name and name of physician performing surgery? [facility] and Dr. Gloriann Loan  6. What is your office phone and fax number? [phone] ext [phone] fax: [phone]  7. Anesthesia type (None, local, MAC, general) ? general   Terry Parks 08/16/2017, 1:30 PM  _________________________________________________________________   (provider comments below)

## 2017-08-18 ENCOUNTER — Other Ambulatory Visit: Payer: Self-pay | Admitting: Urology

## 2017-08-19 ENCOUNTER — Telehealth: Payer: Self-pay

## 2017-08-19 NOTE — Telephone Encounter (Signed)
Received message from [place] to contact patient.  Patient notified directly to record his weight his per Dr Sallyanne Kuster and bring with him to his next visit. Patient voiced understanding.

## 2017-08-19 NOTE — Telephone Encounter (Signed)
-----   Message from Lonn Georgia, PA-C sent at 08/18/2017  4:19 PM EDT ----- Dr. Sallyanne Kuster wants him to bring a weight log of his daily weights to his appointment next week.  Can you call and ask him?  Thanks ----- Message ----- From: [name], MD Sent: 07/01/2017   2:04 PM To: Lonn Georgia, PA-C    ----- Message ----- From: Reola Mosher Sent: 07/01/2017   1:38 PM To: [name], MD

## 2017-08-19 NOTE — Telephone Encounter (Signed)
   Patient needs a prostatectomy.  He has an appointment with Dr. Sallyanne Kuster on 08/22/2017.  Dr. Sallyanne Kuster to address preoperative clearance at that appointment. Will route this to Dr. Sallyanne Kuster.  Rosaria Ferries, PA-C 08/19/2017 2:57 PM Beeper [phone]

## 2017-08-22 ENCOUNTER — Encounter: Payer: Self-pay | Admitting: Cardiovascular Disease

## 2017-08-22 ENCOUNTER — Ambulatory Visit (INDEPENDENT_AMBULATORY_CARE_PROVIDER_SITE_OTHER): Payer: No Typology Code available for payment source | Admitting: Cardiovascular Disease

## 2017-08-22 VITALS — BP 122/84 | HR 74 | Ht 73.0 in | Wt 286.6 lb

## 2017-08-22 DIAGNOSIS — E785 Hyperlipidemia, unspecified: Secondary | ICD-10-CM

## 2017-08-22 DIAGNOSIS — I4819 Other persistent atrial fibrillation: Secondary | ICD-10-CM

## 2017-08-22 DIAGNOSIS — E669 Obesity, unspecified: Secondary | ICD-10-CM

## 2017-08-22 DIAGNOSIS — G4733 Obstructive sleep apnea (adult) (pediatric): Secondary | ICD-10-CM

## 2017-08-22 DIAGNOSIS — Z01810 Encounter for preprocedural cardiovascular examination: Secondary | ICD-10-CM

## 2017-08-22 DIAGNOSIS — Z6837 Body mass index (BMI) 37.0-37.9, adult: Secondary | ICD-10-CM

## 2017-08-22 DIAGNOSIS — I10 Essential (primary) hypertension: Secondary | ICD-10-CM

## 2017-08-22 DIAGNOSIS — I5042 Chronic combined systolic (congestive) and diastolic (congestive) heart failure: Secondary | ICD-10-CM

## 2017-08-22 DIAGNOSIS — Z79899 Other long term (current) drug therapy: Secondary | ICD-10-CM

## 2017-08-22 DIAGNOSIS — I481 Persistent atrial fibrillation: Secondary | ICD-10-CM

## 2017-08-22 DIAGNOSIS — E1169 Type 2 diabetes mellitus with other specified complication: Secondary | ICD-10-CM

## 2017-08-22 DIAGNOSIS — I428 Other cardiomyopathies: Secondary | ICD-10-CM

## 2017-08-22 LAB — COMPREHENSIVE METABOLIC PANEL
ALT: 15 IU/L (ref 0–44)
AST: 18 IU/L (ref 0–40)
Albumin/Globulin Ratio: 1 — ABNORMAL LOW (ref 1.2–2.2)
Albumin: 3.4 g/dL — ABNORMAL LOW (ref 3.5–5.5)
Alkaline Phosphatase: 84 IU/L (ref 39–117)
BUN/Creatinine Ratio: 5 — ABNORMAL LOW (ref 9–20)
BUN: 6 mg/dL (ref 6–24)
Bilirubin Total: 0.3 mg/dL (ref 0.0–1.2)
CALCIUM: 8.8 mg/dL (ref 8.7–10.2)
CO2: 27 mmol/L (ref 20–29)
CREATININE: 1.13 mg/dL (ref 0.76–1.27)
Chloride: 103 mmol/L (ref 96–106)
GFR calc Af Amer: 82 mL/min/{1.73_m2} (ref >59)
GFR, EST NON AFRICAN AMERICAN: 71 mL/min/{1.73_m2} (ref >59)
GLUCOSE: 165 mg/dL — AB (ref 65–99)
Globulin, Total: 3.3 g/dL (ref 1.5–4.5)
Potassium: 4.4 mmol/L (ref 3.5–5.2)
Sodium: 140 mmol/L (ref 134–144)
Total Protein: 6.7 g/dL (ref 6.0–8.5)

## 2017-08-22 LAB — LIPID PANEL
CHOL/HDL RATIO: 3.3 ratio (ref 0.0–5.0)
CHOLESTEROL TOTAL: 130 mg/dL (ref 100–199)
HDL: 39 mg/dL — AB (ref >39)
LDL CALC: 75 mg/dL (ref 0–99)
TRIGLYCERIDES: 79 mg/dL (ref 0–149)
VLDL Cholesterol Cal: 16 mg/dL (ref 5–40)

## 2017-08-22 NOTE — Telephone Encounter (Signed)
Letter sent via Epic 

## 2017-08-22 NOTE — Patient Instructions (Signed)
Medication Instructions: Dr Sallyanne Kuster has recommended making the following medication changes: 1. CHANGE the way you take Furosemide If you weigh 280 pounds or more > take 2 tablets (80 mg total) in the morning and 1 tablet (40 mg total) in the afternoon. If you weigh 280 pounds or less > take 1 tablet (40 mg total) twice daily  Your physician recommends that you weigh, daily, at the same time every day, and in the same amount of clothing. Please record your daily weights on the handout provided and bring it to your next appointment.  Labwork: Your physician recommends that you return for lab work at your earliest [place].  Testing/Procedures: NONE ORDERED  Follow-up: Dr Sallyanne Kuster recommends that you schedule a follow-up appointment in 6 months. You will receive a reminder letter in the mail two months in advance. If you don't receive a letter, please call our office to schedule the follow-up appointment.  If you need a refill on your cardiac medications before your next appointment, please call your pharmacy.

## 2017-08-22 NOTE — Telephone Encounter (Signed)
Patient has appointment with Dr. Sallyanne Kuster today.

## 2017-08-22 NOTE — Progress Notes (Signed)
Patient ID: Terry Parks, male   DOB: 1956-02-23, 62 y.o.   MRN: [id number]    Cardiology Office Note    Date:  08/24/2017   ID:  Terry Parks, DOB Jul 25, 1955, MRN [id number]  PCP:  [name], MD  Cardiologist:   [name], MD   Chief Complaint  Patient presents with  . Pre-op Exam    History of Present Illness:  Terry Parks is a 62 y.o. male with  combined systolic and diastolic heart failure (LVEF 30-35% by most recent echo in January 2019) paroxysmal atrial fibrillation. He has never had catheterization and has not had angina pectoris, but a nuclear stress test in 2013 showed a fixed anterior defect.  He has systemic hypertension, severe obesity, obstructive sleep apnea on CPAP, and diabetes mellitus.  He is planning to undergo prostatectomy with Dr. Gloriann Loan in the near future.  He was hospitalized with acute heart failure exacerbation in the setting of acute kidney injury secondary to nephrolithiasis with ureteral obstruction in January, but since then has done well, without repeat hospitalization or need for diuretic dose adjustment.  General hospitalization he was diagnosed with atrial fibrillation.  He underwent cardioversion and then had atrial flutter.  He was started on amiodarone and Xarelto.  He has not had any serious bleeding problems or neurological events.  His anticoagulant was stopped for a prostate biopsy and is still on hold in anticipation of prostatectomy.  The patient specifically denies any chest pain at rest or exertion, dyspnea at rest or with usual activity, orthopnea, paroxysmal nocturnal dyspnea, syncope, palpitations, focal neurological deficits, intermittent claudication, lower extremity edema, unexplained weight gain, cough, hemoptysis or wheezing.   Past Medical History:  Diagnosis Date  . Abscessed tooth    top back large cavity no pain or drainage, one on bottom  pt pulled tooth 4-5 months ago, right top large hole in tooth  . Allergic  rhinitis   . BPH (benign prostatic hypertrophy)    Massive BPH noted on cystoscopy 1/23/ 2012 by Dr. Risa Grill.  . Cardiomyopathy ([place])   . CHF (congestive heart failure) ([place])   . Depression   . Diabetes mellitus 04/08/2008   type 2  . Foley catheter in place 07-05-17 placed  . Headache(784.0)    hx migraines none recent  . Hyperlipemia   . Hypertension   . Hypertensive cardiopathy 03/01/2006   2-D echocardiogram 02/01/2012 showed moderate LVH, mildly to moderately reduced left ventricular systolic function with an estimated ejection fraction of 40-45%, and diffuse hypokinesis.  A nuclear medicine stress study done 01/31/2012 showed no reversible ischemia, a small mid anterior wall fixed defect/infarct, and ejection fraction 42%.      . Neck pain   . Obstructive sleep apnea 03/06/2008   Sleep study 03/06/08 showed severe OSA/hypopnea syndrome, with successful CPAP titration to 13 CWP using a medium ResMed Mirage Quattro full face mask with heated humidifier.   . Renal calculus 05/29/2010   CT scan of abdomen/pelvis on 05/29/2010 showed an obstructing approximate 1-2 mm calculus at the left UVJ, and an approximate 1-2 mm left lower pole renal calculus.   Patient had continuing severe pain , and an elevation of his serum creatinine to a value of 1.75 on 06/06/2010.  The stone had apparently passed and was not seen on repeat CT 06/08/2010.    Past Surgical History:  Procedure Laterality Date  . big toe nails removed Bilateral 20 yrs ago  . CARDIOVERSION N/A 06/08/2017   Procedure: CARDIOVERSION;  Surgeon: Johnsie Cancel,  Wallis Bamberg, MD;  Location: [facility] ENDOSCOPY;  Service: Cardiovascular;  Laterality: N/A;  . CYSTOSCOPY W/ RETROGRADES    . TRANSURETHRAL RESECTION OF BLADDER TUMOR N/A 07/13/2017   Procedure: TRANSURETHRAL RESECTION OF PROSTATE;  Surgeon: Lucas Mallow, MD;  Location: [facility] ORS;  Service: Urology;  Laterality: N/A;    Outpatient Medications Prior to Visit  Medication Sig Dispense Refill  .  amLODipine (NORVASC) 5 MG tablet Take 1 tablet (5 mg total) by mouth daily. 30 tablet 2  . atorvastatin (LIPITOR) 40 MG tablet Take 1 tablet (40 mg total) by mouth daily. 90 tablet 3  . Azilsartan Medoxomil (EDARBI) 40 MG TABS Take 1 tablet by mouth daily. 30 tablet 11  . DULoxetine (CYMBALTA) 60 MG capsule Take 1 capsule (60 mg total) by mouth daily. 90 capsule 1  . Empagliflozin-Metformin HCl ER (SYNJARDY XR) 09-998 MG TB24 Take 1 tablet by mouth 2 (two) times daily with a meal. 60 tablet 11  . glucose blood (TRUETRACK TEST) test strip Use as directed to test blood sugar three times a day before meals. 100 each 1  . HYDROcodone-acetaminophen (NORCO) 5-325 MG tablet Take 1 tablet by mouth every 4 (four) hours as needed for moderate pain. 10 tablet 0  . Insulin Glargine (BASAGLAR KWIKPEN) 100 UNIT/ML SOPN Inject 0.6 mLs (60 Units total) into the skin daily. (Patient taking differently: Inject 60 Units into the skin at bedtime. ) 45 mL 3  . Insulin Pen Needle (CARETOUCH PEN NEEDLES) 31G X 6 MM MISC 1 pen by Does not apply route at bedtime. 100 each 3  . isosorbide mononitrate (IMDUR) 30 MG 24 hr tablet Take 1 tablet (30 mg total) by mouth daily. 90 tablet 3  . Lancets MISC Use to test blood sugar three times a day before meals. 100 each 1  . metFORMIN (GLUCOPHAGE) 1000 MG tablet Take 1,000 mg by mouth 2 (two) times daily with a meal.    . Misc. Devices MISC by Does not apply route. C-PAP    . amiodarone (PACERONE) 200 MG tablet Take 1 tablet (200 mg total) by mouth daily. 30 tablet 0  . carvedilol (COREG) 12.5 MG tablet Take 1 tablet (12.5 mg total) by mouth 2 (two) times daily with a meal. 60 tablet 0  . furosemide (LASIX) 40 MG tablet Take 1 tablet (40 mg total) by mouth 2 (two) times daily. 60 tablet 0   No facility-administered medications prior to visit.      Allergies:   Lisinopril   Social History   Socioeconomic History  . Marital status: Single    Spouse name: Not on file  . Number  of children: 4 b  . Years of education: 85  . Highest education level: Not on file  Occupational History  . Occupation:        Employer: UNEMPLOYED  Social Needs  . Financial resource strain: Not on file  . Food insecurity:    Worry: Not on file    Inability: Not on file  . Transportation needs:    Medical: Not on file    Non-medical: Not on file  Tobacco Use  . Smoking status: Never Smoker  . Smokeless tobacco: Never Used  Substance and Sexual Activity  . Alcohol use: No    Alcohol/week: 0.0 oz  . Drug use: No  . Sexual activity: Not Currently  Lifestyle  . Physical activity:    Days per week: Not on file    Minutes per session: Not on  file  . Stress: Not on file  Relationships  . Social connections:    Talks on phone: Not on file    Gets together: Not on file    Attends religious service: Not on file    Active member of club or organization: Not on file    Attends meetings of clubs or organizations: Not on file    Relationship status: Not on file  Other Topics Concern  . Not on file  Social History Narrative   Divorced, 4 children, lives alone.       Family History:  The patient's family history includes Breast cancer in his mother; Diabetes in his maternal grandmother; Hypertension in his father.   ROS:   Please see the history of present illness.    ROS All other systems reviewed and are negative.   PHYSICAL EXAM:   VS:  BP 122/84   Pulse 74   Ht 6\' 1"  (1.854 m)   Wt 286 lb 9.6 oz (130 kg)   BMI 37.81 kg/m     General: Alert, oriented x3, no distress, obese Head: no evidence of trauma, PERRL, EOMI, no exophtalmos or lid lag, no myxedema, no xanthelasma; normal ears, nose and oropharynx Neck: normal jugular venous pulsations and no hepatojugular reflux; brisk carotid pulses without delay and no carotid bruits Chest: clear to auscultation, no signs of consolidation by percussion or palpation, normal fremitus, symmetrical and full respiratory  excursions Cardiovascular: normal position and quality of the apical impulse, regular rhythm, normal first and second heart sounds, no murmurs, rubs or gallops Abdomen: no tenderness or distention, no masses by palpation, no abnormal pulsatility or arterial bruits, normal bowel sounds, no hepatosplenomegaly Extremities: no clubbing, cyanosis or edema; 2+ radial, ulnar and brachial pulses bilaterally; 2+ right femoral, posterior tibial and dorsalis pedis pulses; 2+ left femoral, posterior tibial and dorsalis pedis pulses; no subclavian or femoral bruits Neurological: grossly nonfocal Psych: Normal mood and affect   Wt Readings from Last 3 Encounters:  08/22/17 286 lb 9.6 oz (130 kg)  07/26/17 277 lb (125.6 kg)  07/13/17 285 lb 7.9 oz (129.5 kg)      Studies/Labs Reviewed:   EKG:  EKG is ordered today.  The ekg ordered today demonstrates NSR, QTc 403 ms. Probable LA abnormality Recent Labs: 06/06/2017: B Natriuretic Peptide 991.6; TSH 0.236 06/08/2017: Magnesium 2.0 06/14/2017: Platelets 231 07/14/2017: Hemoglobin 11.0 08/22/2017: ALT 15; BUN 6; Creatinine, Ser 1.13; Potassium 4.4; Sodium 140   Lipid Panel    Component Value Date/Time   CHOL 130 08/22/2017 1151   TRIG 79 08/22/2017 1151   HDL 39 (L) 08/22/2017 1151   CHOLHDL 3.3 08/22/2017 1151   CHOLHDL 5.1 07/18/2014 1056   VLDL 17 07/18/2014 1056   LDLCALC 75 08/22/2017 1151     ASSESSMENT:    1. Chronic combined systolic and diastolic CHF (congestive heart failure) (HCC)   2. Persistent atrial fibrillation ([place])   3. Essential hypertension   4. Nonischemic cardiomyopathy ([place])   5. OSA (obstructive sleep apnea)   6. Diabetes mellitus type 2 in obese (HCC)   7. Class 2 severe obesity due to excess calories with serious comorbidity and body mass index (BMI) of 37.0 to 37.9 in adult ([place])   8. Hyperlipidemia associated with type 2 diabetes mellitus ([place])   9. Preoperative cardiovascular examination   10. Medication  management      PLAN:  In order of problems listed above:  1. CHF: Seems to be compensated, NYHA functional class  1-2.  Try to keep weight in the 285-290 range.  He is on ARB and carvedilol.  The 0 small dose of loop diuretic.  Reminded him about the importance of regular weight monitoring and sodium restriction in his diet. 2. AFib: Maintaining sinus rhythm on amiodarone.  Need to make sure he has thyroid and liver function test checked every 6 months.  Anticoagulation is currently on hold for planned prostate surgery. 3. HTN: excellent control 4. CMP: Because of his cardiomyopathy is not entirely clear, but his ejection fraction clearly is dependent on the adequacy of blood pressure control.  Whenever his blood pressure is well controlled, the estimation of his ejection fraction is always much better. 5. OSA: Recommend 100% compliance with CPAP. 6. DM: I am pleased that he is now taking Jardiance is 1 of his antidiabetic medications.  This should help prevent repeat hospitalization and may have benefits for long-term survival with cardiomyopathy. Retinopathy marks risk for progression of renal diabetic disease 7. Obesity: He would greatly benefit from substantial weight loss in the long-term. 8. HLP: Target LDL<100, preferably less than 70, on high-dose statin therapy.  Need to recheck 1 week he has had at least 3 months of sustained treatment with a statin. 9. Preop exam: Despite his numerous cardiac problems he appears to be well compensated at this time.  It will be important to avoid excessive administration of intravenous fluids.  It is important that his heart failure medications and particularly his carvedilol is continued without interruption in the perioperative period.    Medication Adjustments/Labs and Tests Ordered: Current medicines are reviewed at length with the patient today.  Concerns regarding medicines are outlined above.  Medication changes, Labs and Tests ordered today are  listed in the Patient Instructions below. Patient Instructions  Medication Instructions: Dr Sallyanne Kuster has recommended making the following medication changes: 1. CHANGE the way you take Furosemide If you weigh 280 pounds or more > take 2 tablets (80 mg total) in the morning and 1 tablet (40 mg total) in the afternoon. If you weigh 280 pounds or less > take 1 tablet (40 mg total) twice daily  Your physician recommends that you weigh, daily, at the same time every day, and in the same amount of clothing. Please record your daily weights on the handout provided and bring it to your next appointment.  Labwork: Your physician recommends that you return for lab work at your earliest [place].  Testing/Procedures: NONE ORDERED  Follow-up: Dr Sallyanne Kuster recommends that you schedule a follow-up appointment in 6 months. You will receive a reminder letter in the mail two months in advance. If you don't receive a letter, please call our office to schedule the follow-up appointment.  If you need a refill on your cardiac medications before your next appointment, please call your pharmacy. Signed, [name], MD  08/24/2017 2:42 PM    [facility] [place], [place], [place]  [address] Phone: [phone]; Fax: [phone]

## 2017-08-24 ENCOUNTER — Encounter: Payer: Self-pay | Admitting: Cardiovascular Disease

## 2017-08-25 ENCOUNTER — Other Ambulatory Visit: Payer: Self-pay | Admitting: Pharmacist

## 2017-08-25 DIAGNOSIS — E785 Hyperlipidemia, unspecified: Principal | ICD-10-CM

## 2017-08-25 DIAGNOSIS — E1169 Type 2 diabetes mellitus with other specified complication: Secondary | ICD-10-CM

## 2017-08-25 MED ORDER — EMPAGLIFLOZIN-METFORMIN HCL ER 5-1000 MG PO TB24: 2.0000 | ORAL_TABLET | Freq: Every day | ORAL | 11 refills | Status: DC

## 2017-08-25 NOTE — Progress Notes (Signed)
Switched instructions on Synjardy XR from 1 tablet BID to 2 tablets daily for patient assistance program.

## 2017-09-16 ENCOUNTER — Other Ambulatory Visit: Payer: Self-pay

## 2017-09-16 ENCOUNTER — Other Ambulatory Visit: Payer: Self-pay | Admitting: Pharmacist

## 2017-09-16 ENCOUNTER — Encounter: Payer: Self-pay | Admitting: Internal Medicine

## 2017-09-16 ENCOUNTER — Ambulatory Visit (INDEPENDENT_AMBULATORY_CARE_PROVIDER_SITE_OTHER): Payer: No Typology Code available for payment source | Admitting: Internal Medicine

## 2017-09-16 ENCOUNTER — Ambulatory Visit: Payer: No Typology Code available for payment source | Admitting: Pharmacist

## 2017-09-16 VITALS — BP 134/75 | HR 85 | Temp 98.2°F | Ht 73.0 in | Wt 290.1 lb

## 2017-09-16 DIAGNOSIS — I5042 Chronic combined systolic (congestive) and diastolic (congestive) heart failure: Secondary | ICD-10-CM

## 2017-09-16 DIAGNOSIS — E785 Hyperlipidemia, unspecified: Secondary | ICD-10-CM

## 2017-09-16 DIAGNOSIS — I110 Hypertensive heart disease with heart failure: Secondary | ICD-10-CM

## 2017-09-16 DIAGNOSIS — N400 Enlarged prostate without lower urinary tract symptoms: Secondary | ICD-10-CM

## 2017-09-16 DIAGNOSIS — Z794 Long term (current) use of insulin: Secondary | ICD-10-CM

## 2017-09-16 DIAGNOSIS — I4819 Other persistent atrial fibrillation: Secondary | ICD-10-CM

## 2017-09-16 DIAGNOSIS — J309 Allergic rhinitis, unspecified: Secondary | ICD-10-CM

## 2017-09-16 DIAGNOSIS — Z79899 Other long term (current) drug therapy: Secondary | ICD-10-CM

## 2017-09-16 DIAGNOSIS — I10 Essential (primary) hypertension: Secondary | ICD-10-CM

## 2017-09-16 DIAGNOSIS — I481 Persistent atrial fibrillation: Secondary | ICD-10-CM

## 2017-09-16 DIAGNOSIS — N3289 Other specified disorders of bladder: Secondary | ICD-10-CM

## 2017-09-16 DIAGNOSIS — R079 Chest pain, unspecified: Secondary | ICD-10-CM

## 2017-09-16 DIAGNOSIS — E1169 Type 2 diabetes mellitus with other specified complication: Secondary | ICD-10-CM

## 2017-09-16 DIAGNOSIS — E1142 Type 2 diabetes mellitus with diabetic polyneuropathy: Secondary | ICD-10-CM

## 2017-09-16 DIAGNOSIS — G4733 Obstructive sleep apnea (adult) (pediatric): Secondary | ICD-10-CM

## 2017-09-16 LAB — GLUCOSE, CAPILLARY: Glucose-Capillary: 239 mg/dL — ABNORMAL HIGH (ref 65–99)

## 2017-09-16 LAB — POCT GLYCOSYLATED HEMOGLOBIN (HGB A1C): Hemoglobin A1C: 7.2

## 2017-09-16 NOTE — Progress Notes (Addendum)
CC: Diabetes Mellitus  HPI:  [name] is a 62 y.o. with essential hypertension, chronic combined systolic and diastolic heart failure, atrial fibrillation, obstructive sleep apnea, hyperlipidemia, type 2 diabetes, and bph who presents to clinic for follow up regarding diabetes mellitus. Please see problem based charting for evaluation, assessment, and plan.  Past Medical History:  Diagnosis Date  . Abscessed tooth    top back large cavity no pain or drainage, one on bottom  pt pulled tooth 4-5 months ago, right top large hole in tooth  . Allergic rhinitis   . BPH (benign prostatic hypertrophy)    Massive BPH noted on cystoscopy 1/23/ 2012 by Dr. Risa Grill.  . Cardiomyopathy ([place])   . CHF (congestive heart failure) ([place])   . Depression   . Diabetes mellitus 04/08/2008   type 2  . Foley catheter in place 07-05-17 placed  . Headache(784.0)    hx migraines none recent  . Hyperlipemia   . Hypertension   . Hypertensive cardiopathy 03/01/2006   2-D echocardiogram 02/01/2012 showed moderate LVH, mildly to moderately reduced left ventricular systolic function with an estimated ejection fraction of 40-45%, and diffuse hypokinesis.  A nuclear medicine stress study done 01/31/2012 showed no reversible ischemia, a small mid anterior wall fixed defect/infarct, and ejection fraction 42%.      . Neck pain   . Obstructive sleep apnea 03/06/2008   Sleep study 03/06/08 showed severe OSA/hypopnea syndrome, with successful CPAP titration to 13 CWP using a medium ResMed Mirage Quattro full face mask with heated humidifier.   . Renal calculus 05/29/2010   CT scan of abdomen/pelvis on 05/29/2010 showed an obstructing approximate 1-2 mm calculus at the left UVJ, and an approximate 1-2 mm left lower pole renal calculus.   Patient had continuing severe pain , and an elevation of his serum creatinine to a value of 1.75 on 06/06/2010.  The stone had apparently passed and was not seen on repeat CT 06/08/2010.     Review of Systems:  Denies sob, chest pain, runny eyes. Has pain in lower extremities  Physical Exam:  Vitals:   09/16/17 1410  BP: 134/75  Pulse: 85  Temp: 98.2 F (36.8 C)  TempSrc: Oral  SpO2: 100%  Weight: 290 lb 1.6 oz (131.6 kg)  Height: 6\' 1"  (1.854 m)   Physical Exam  Constitutional: He appears well-developed and well-nourished. No distress.  HENT:  Head: Normocephalic and atraumatic.  Eyes: Conjunctivae are normal.  Cardiovascular: Normal rate and normal heart sounds.  Respiratory: Effort normal and breath sounds normal. No respiratory distress. He has no wheezes.  GI: Soft. Bowel sounds are normal. He exhibits no distension. There is no tenderness.  Musculoskeletal: He exhibits tenderness (lower extremity).  Neurological: He is alert.  Left lower extremity has sensation intact. Right lower extremity has mildly diminished sensation. Vibration sensation decreased in right foot   Skin: He is not diaphoretic. No erythema.  Psychiatric: He has a normal mood and affect. His behavior is normal. Judgment and thought content normal.     Assessment & Plan:   See Encounters Tab for problem based charting.   Essential Hypertension  The patient is currently taking amlodipine 5mg  qd, carvedilol 12.5mg  bid, lasix 40mg  bid, imdur 30mg  qd, and edarbi (azilsartan medoxomil) 40mg  qd. The patient's blood pressure during this visit is 134/75.   -Continue current blood pressure regimen  Diabetes Mellitus The patient's a1c during this visit was 7.2 which has decreased from last a1c=8.1 on 06/06/17. The patient's home  blood glucose measurements over the past month have ranged 130-250 with most being in 130-160 range. The patient does/does not note episodes of hypoglycemia.  The patient is currently taking metformin 1000mg  bid, glargine 60u qd, synjardy (empagliflozin-metformin) 5-1000mg  2 tablets daily He is compliant with medication. The patient currently weighs 290lb today 5/3 from  previous 286lbs on 08/22/17.   -Continue current diabetes medications  Chronic combined systolic and diastolic heart failure The patient was last evaluated by Dr. Sallyanne Kuster on 08/22/17 during which time he remined the patient about the importance of monitoring his weight and limiting sodium in his diet. Currently taking lasix 40mg  bid and imdur 30mg  qd. [name] should also help patient prevent further hospitalizations. The patient's weight during this visit was 290lbs which is up from 286lbs on last visit.   -Encouraged the patient to remain compliant with his medications and ensure that he does not increase his weight further from 290lbs.   Atrial fibrillation  The patient is currently in normal sinus rhythm. He is currently taking amiodarone 200mg  qd and carvedilol 12.5mg  bid.    Patient's last TSH=0.236, ast=29, alt=16, alp=67 on 06/06/17. TSH and LFTs should continue to be checked every 6 months due to amiodarone use.   Diabetic Neuropathy The patient states that he has lower extremity pain and numbness. He is currently on cymbalta 60mg  qd. The patient has mildly diminished right lower sensation and vibration sensation.   -Placed order for diabetic shoes  -Podiatry referral placed  -continue cymbalta -start gabapentin 300mg  qd   Allergic Rhinitis The patient mentioned that he has been having runny eyes and sneezing recently.   -Use over the counter artificial eyes   Patient discussed with Dr. Rebeca Alert

## 2017-09-16 NOTE — Patient Instructions (Signed)
It was a pleasure to see you today [name]!  -Please continue to take all your medications as indicated -Our clinic staff will follow up with you regarding your diabetic shoes -Get over the counter eye drops for your runny eyes  If you have any questions or concerns, please call our clinic at [phone] between 9am-5pm and after hours call [phone] and ask for the internal medicine resident on call. If you feel you are having a medical emergency please call 911.   Thank you, we look forward to help you remain healthy!  Terry Mage, MD Internal Medicine PGY1

## 2017-09-16 NOTE — Progress Notes (Signed)
Patient was seen today in a co-visit between the physician and pharmacist.  Please see documentation under Dr. Thea Gist visit today for more details.    S: Terry Parks is a 62 y.o. male reports to clinical pharmacist appointment for medication review. Patient did  bring medication bottles.  Allergies  Allergen Reactions  . Lisinopril Cough   Medication Sig  amLODipine (NORVASC) 5 MG tablet Take 1 tablet (5 mg total) by mouth daily.  atorvastatin (LIPITOR) 40 MG tablet Take 1 tablet (40 mg total) by mouth daily.  DULoxetine (CYMBALTA) 60 MG capsule Take 1 capsule (60 mg total) by mouth daily.  Empagliflozin-metFORMIN HCl ER (SYNJARDY XR) 09-998 MG TB24 Take 2 tablets by mouth daily.  isosorbide mononitrate (IMDUR) 30 MG 24 hr tablet Take 1 tablet (30 mg total) by mouth daily.  losartan (COZAAR) 50 MG tablet Take 50 mg by mouth daily.  amiodarone (PACERONE) 200 MG tablet Take 1 tablet (200 mg total) by mouth daily.  carvedilol (COREG) 12.5 MG tablet Take 1 tablet (12.5 mg total) by mouth 2 (two) times daily with a meal.  furosemide (LASIX) 40 MG tablet Take 1 tablet (40 mg total) by mouth 2 (two) times daily.  glucose blood (TRUETRACK TEST) test strip Use as directed to test blood sugar three times a day before meals.  HYDROcodone-acetaminophen (NORCO) 5-325 MG tablet Take 1 tablet by mouth every 4 (four) hours as needed for moderate pain.  Insulin Glargine (BASAGLAR KWIKPEN) 100 UNIT/ML SOPN Inject 0.6 mLs (60 Units total) into the skin daily. Patient taking differently: Inject 60 Units into the skin at bedtime.   Insulin Pen Needle (CARETOUCH PEN NEEDLES) 31G X 6 MM MISC 1 pen by Does not apply route at bedtime.  Lancets MISC Use to test blood sugar three times a day before meals.  Misc. Devices MISC by Does not apply route. C-PAP   Past Medical History:  Diagnosis Date  . Abscessed tooth    top back large cavity no pain or drainage, one on bottom  pt pulled tooth 4-5 months  ago, right top large hole in tooth  . Allergic rhinitis   . BPH (benign prostatic hypertrophy)    Massive BPH noted on cystoscopy 1/23/ 2012 by Dr. Risa Grill.  . Cardiomyopathy ([place])   . CHF (congestive heart failure) ([place])   . Depression   . Diabetes mellitus 04/08/2008   type 2  . Foley catheter in place 07-05-17 placed  . Headache(784.0)    hx migraines none recent  . Hyperlipemia   . Hypertension   . Hypertensive cardiopathy 03/01/2006   2-D echocardiogram 02/01/2012 showed moderate LVH, mildly to moderately reduced left ventricular systolic function with an estimated ejection fraction of 40-45%, and diffuse hypokinesis.  A nuclear medicine stress study done 01/31/2012 showed no reversible ischemia, a small mid anterior wall fixed defect/infarct, and ejection fraction 42%.      . Neck pain   . Obstructive sleep apnea 03/06/2008   Sleep study 03/06/08 showed severe OSA/hypopnea syndrome, with successful CPAP titration to 13 CWP using a medium ResMed Mirage Quattro full face mask with heated humidifier.   . Renal calculus 05/29/2010   CT scan of abdomen/pelvis on 05/29/2010 showed an obstructing approximate 1-2 mm calculus at the left UVJ, and an approximate 1-2 mm left lower pole renal calculus.   Patient had continuing severe pain , and an elevation of his serum creatinine to a value of 1.75 on 06/06/2010.  The stone had apparently passed and was not  seen on repeat CT 06/08/2010.   Social History   Socioeconomic History  . Marital status: Single    Spouse name: Not on file  . Number of children: 4 b  . Years of education: 48  . Highest education level: Not on file  Occupational History  . Occupation:        Employer: UNEMPLOYED  Social Needs  . Financial resource strain: Not on file  . Food insecurity:    Worry: Not on file    Inability: Not on file  . Transportation needs:    Medical: Not on file    Non-medical: Not on file  Tobacco Use  . Smoking status: Never Smoker  .  Smokeless tobacco: Never Used  Substance and Sexual Activity  . Alcohol use: No    Alcohol/week: 0.0 oz  . Drug use: No  . Sexual activity: Not Currently  Lifestyle  . Physical activity:    Days per week: Not on file    Minutes per session: Not on file  . Stress: Not on file  Relationships  . Social connections:    Talks on phone: Not on file    Gets together: Not on file    Attends religious service: Not on file    Active member of club or organization: Not on file    Attends meetings of clubs or organizations: Not on file    Relationship status: Not on file  Other Topics Concern  . Not on file  Social History Narrative   Divorced, 4 children, lives alone.     Family History  Problem Relation Age of Onset  . Breast cancer Mother   . Hypertension Father   . Diabetes Maternal Grandmother   . Colon cancer Neg Hx   . Prostate cancer Neg Hx   . Heart attack Neg Hx    O: Component Value Date/Time   CHOL 130 08/22/2017 1151   HDL 39 (L) 08/22/2017 1151   TRIG 79 08/22/2017 1151   AST 18 08/22/2017 1151   ALT 15 08/22/2017 1151   NA 140 08/22/2017 1151   K 4.4 08/22/2017 1151   CL 103 08/22/2017 1151   CO2 27 08/22/2017 1151   GLUCOSE 165 (H) 08/22/2017 1151   GLUCOSE 101 (H) 06/14/2017 0358   HGBA1C 7.2 09/16/2017 1451   HGBA1C 8.1 (H) 06/06/2017 2208   BUN 6 08/22/2017 1151   CREATININE 1.13 08/22/2017 1151   CREATININE 1.08 07/18/2014 1056   CALCIUM 8.8 08/22/2017 1151   GFRNONAA 71 08/22/2017 1151   GFRNONAA 76 07/18/2014 1056   GFRAA 82 08/22/2017 1151   GFRAA 88 07/18/2014 1056   WBC 9.1 06/14/2017 0358   HGB 11.0 (L) 07/14/2017 0438   HCT 35.1 (L) 07/14/2017 0438   PLT 231 06/14/2017 0358   TSH 0.236 (L) 06/06/2017 2208   TSH 0.289 (L) 01/31/2012 1056   Ht Readings from Last 2 Encounters:  09/16/17 6\' 1"  (1.854 m)  08/22/17 6\' 1"  (1.854 m)   Wt Readings from Last 2 Encounters:  09/16/17 290 lb 1.6 oz (131.6 kg)  08/22/17 286 lb 9.6 oz (130 kg)    There is no height or weight on file to calculate BMI. BP Readings from Last 3 Encounters:  09/16/17 134/75  08/22/17 122/84  07/26/17 (!) 188/118   A/P: Medications were reviewed with the patient, including name, instructions, indication, goals of therapy, potential side effects, importance of adherence, and safe use. He confirms he is now able to successfully obtain  his medications from [employer] (manufacturer for [employer]), and was educated about the process for obtaining refills. Patient noted he is out of refills for carvedilol, furosemide, losartan, amiodarone, and amlodipine. Will notify PCP.  Advised patient to contact me if further assistance with medications is needed in the future.  Patient verbalized understanding by repeating back information and was advised to contact me if further medication-related questions arise. Patient was also provided an information handout.

## 2017-09-17 MED ORDER — GABAPENTIN 300 MG PO CAPS: 300.0000 mg | ORAL_CAPSULE | Freq: Every day | ORAL | 0 refills | Status: DC

## 2017-09-17 MED ORDER — AMIODARONE HCL 200 MG PO TABS: 200.0000 mg | ORAL_TABLET | Freq: Every day | ORAL | 0 refills | Status: DC

## 2017-09-17 MED ORDER — CARVEDILOL 12.5 MG PO TABS: 12.5000 mg | ORAL_TABLET | Freq: Two times a day (BID) | ORAL | 0 refills | Status: DC

## 2017-09-17 MED ORDER — LOSARTAN POTASSIUM 50 MG PO TABS: 50.0000 mg | ORAL_TABLET | Freq: Every day | ORAL | 3 refills | Status: DC

## 2017-09-17 MED ORDER — AMLODIPINE BESYLATE 5 MG PO TABS: 5.0000 mg | ORAL_TABLET | Freq: Every day | ORAL | 2 refills | Status: DC

## 2017-09-17 MED ORDER — FUROSEMIDE 40 MG PO TABS: 40.0000 mg | ORAL_TABLET | Freq: Two times a day (BID) | ORAL | 0 refills | Status: DC

## 2017-09-17 NOTE — Assessment & Plan Note (Signed)
The patient's a1c during this visit was 7.2 which has decreased from last a1c=8.1 on 06/06/17. The patient's home blood glucose measurements over the past month have ranged 130-250 with most being in 130-160 range. The patient does/does not note episodes of hypoglycemia.  The patient is currently taking metformin 1000mg  bid, glargine 60u qd, synjardy (empagliflozin-metformin) 5-1000mg  2 tablets daily He is compliant with medication. The patient currently weighs 290lb today 5/3 from previous 286lbs on 08/22/17.   -Continue current diabetes medications

## 2017-09-17 NOTE — Assessment & Plan Note (Signed)
The patient mentioned that he has been having runny eyes and sneezing recently.   -Use over the counter artificial eyes

## 2017-09-17 NOTE — Assessment & Plan Note (Signed)
The patient states that he has lower extremity pain and numbness. He is currently on cymbalta 60mg  qd.   -continue cymbalta -start gabapentin 300mg  qd

## 2017-09-17 NOTE — Assessment & Plan Note (Signed)
The patient is currently in normal sinus rhythm. He is currently taking amiodarone 200mg  qd and carvedilol 12.5mg  bid.    Patient's last TSH=0.236, ast=29, alt=16, alp=67 on 06/06/17. TSH and LFTs should continue to be checked every 6 months due to amiodarone use.

## 2017-09-17 NOTE — Assessment & Plan Note (Signed)
The patient was last evaluated by Dr. Sallyanne Kuster on 08/22/17 during which time he remined the patient about the importance of monitoring his weight and limiting sodium in his diet. Currently taking lasix 40mg  bid and imdur 30mg  qd. [name] should also help patient prevent further hospitalizations. The patient's weight during this visit was 290lbs which is up from 286lbs on last visit.   -Encouraged the patient to remain compliant with his medications and ensure that he does not increase his weight further from 290lbs.

## 2017-09-17 NOTE — Assessment & Plan Note (Signed)
The patient is currently taking amlodipine 5mg  qd, carvedilol 12.5mg  bid, lasix 40mg  bid, imdur 30mg  qd, and edarbi (azilsartan medoxomil) 40mg  qd. The patient's blood pressure during this visit is 134/75.   -Continue current blood pressure regimen

## 2017-09-17 NOTE — Addendum Note (Signed)
Addended by: [name] on: 09/17/2017 10:16 PM   Modules accepted: Orders

## 2017-09-19 NOTE — Progress Notes (Signed)
Internal Medicine Clinic Attending  Case discussed with Dr. [name]  at the time of the visit.  We reviewed the resident's history and exam and pertinent patient test results.  I agree with the assessment, diagnosis, and plan of care documented in the resident's note.  Alexander N Raines, MD   

## 2017-09-22 NOTE — Progress Notes (Signed)
09-16-17 (Epic) HGA1C  08-22-17 (Epic) Cardiac Clearance from Dr. Thana Farr, EKG  06-07-17 (Epic) ECHO  06-06-17 (Epic) CXR

## 2017-09-22 NOTE — Patient Instructions (Addendum)
Terry Parks  09/22/2017   Your procedure is scheduled on: 10-06-17   Report to [facility] Main  Entrance    Report to admitting at 10:15 AM    Call this number if you have problems the morning of surgery [phone]   Remember: Do not eat food or drink liquids :After Midnight.     Take these medicines the morning of surgery with A SIP OF WATER: Carvedilol (Coreg),                               You may not have any metal on your body including hair pins and              piercings  Do not wear jewelry, lotions, powders or deodorant             Men may shave face and neck.   Do not bring valuables to the hospital. [place].  Contacts, dentures or bridgework may not be worn into surgery.  Leave suitcase in the car. After surgery it may be brought to your room.       Special Instructions: N/A              Please read over the following fact sheets you were given: _____________________________________________________________________ How to Manage Your Diabetes Before and After Surgery  Why is it important to control my blood sugar before and after surgery? . Improving blood sugar levels before and after surgery helps healing and can limit problems. . A way of improving blood sugar control is eating a healthy diet by: o  Eating less sugar and carbohydrates o  Increasing activity/exercise o  Talking with your doctor about reaching your blood sugar goals . High blood sugars (greater than 180 mg/dL) can raise your risk of infections and slow your recovery, so you will need to focus on controlling your diabetes during the weeks before surgery. . Make sure that the doctor who takes care of your diabetes knows about your planned surgery including the date and location.  How do I manage my blood sugar before surgery? . Check your blood sugar at least 4 times a day, starting 2 days before surgery, to make sure  that the level is not too high or low. o Check your blood sugar the morning of your surgery when you wake up and every 2 hours until you get to the Short Stay unit. . If your blood sugar is less than 70 mg/dL, you will need to treat for low blood sugar: o Do not take insulin. o Treat a low blood sugar (less than 70 mg/dL) with  cup of clear juice (cranberry or apple), 4 glucose tablets, OR glucose gel. o Recheck blood sugar in 15 minutes after treatment (to make sure it is greater than 70 mg/dL). If your blood sugar is not greater than 70 mg/dL on recheck, call [phone] for further instructions. . Report your blood sugar to the short stay nurse when you get to Short Stay.  . If you are admitted to the hospital after surgery: o Your blood sugar will be checked by the staff and you will probably be given insulin after surgery (instead of oral diabetes medicines) to make sure you have good blood  sugar levels. o The goal for blood sugar control after surgery is 80-180 mg/dL.   WHAT DO I DO ABOUT MY DIABETES MEDICATION?  [name] Do not take oral diabetes medicines (pills) the morning of surgery.  . THE DAY BEFORE SURGERY, take your usual dose of Synjardy XR and your usual 60 units of your Lantus insulin       Reviewed and Endorsed by [facility] Patient Education Committee, August 2015            [facility] - Preparing for Surgery Before surgery, you can play an important role.  Because skin is not sterile, your skin needs to be as free of germs as possible.  You can reduce the number of germs on your skin by washing with CHG (chlorahexidine gluconate) soap before surgery.  CHG is an antiseptic cleaner which kills germs and bonds with the skin to continue killing germs even after washing. Please DO NOT use if you have an allergy to CHG or antibacterial soaps.  If your skin becomes reddened/irritated stop using the CHG and inform your nurse when you arrive at Short Stay. Do not shave (including  legs and underarms) for at least 48 hours prior to the first CHG shower.  You may shave your face/neck. Please follow these instructions carefully:  1.  Shower with CHG Soap the night before surgery and the  morning of Surgery.  2.  If you choose to wash your hair, wash your hair first as usual with your  normal  shampoo.  3.  After you shampoo, rinse your hair and body thoroughly to remove the  shampoo.                           4.  Use CHG as you would any other liquid soap.  You can apply chg directly  to the skin and wash                       Gently with a scrungie or clean washcloth.  5.  Apply the CHG Soap to your body ONLY FROM THE NECK DOWN.   Do not use on face/ open                           Wound or open sores. Avoid contact with eyes, ears mouth and genitals (private parts).                       Wash face,  Genitals (private parts) with your normal soap.             6.  Wash thoroughly, paying special attention to the area where your surgery  will be performed.  7.  Thoroughly rinse your body with warm water from the neck down.  8.  DO NOT shower/wash with your normal soap after using and rinsing off  the CHG Soap.                9.  Pat yourself dry with a clean towel.            10.  Wear clean pajamas.            11.  Place clean sheets on your bed the night of your first shower and do not  sleep with pets. Day of Surgery : Do not apply any lotions/deodorants the morning of surgery.  Please wear clean clothes to the hospital/surgery center.  FAILURE TO FOLLOW THESE INSTRUCTIONS MAY RESULT IN THE CANCELLATION OF YOUR SURGERY PATIENT SIGNATURE_________________________________  NURSE SIGNATURE__________________________________  ________________________________________________________________________  WHAT IS A BLOOD TRANSFUSION? Blood Transfusion Information  A transfusion is the replacement of blood or some of its parts. Blood is made up of multiple cells which provide  different functions.  Red blood cells carry oxygen and are used for blood loss replacement.  White blood cells fight against infection.  Platelets control bleeding.  Plasma helps clot blood.  Other blood products are available for specialized needs, such as hemophilia or other clotting disorders. BEFORE THE TRANSFUSION  Who gives blood for transfusions?   Healthy volunteers who are fully evaluated to make sure their blood is safe. This is blood bank blood. Transfusion therapy is the safest it has ever been in the practice of medicine. Before blood is taken from a donor, a complete history is taken to make sure that person has no history of diseases nor engages in risky social behavior (examples are intravenous drug use or sexual activity with multiple partners). The donor's travel history is screened to minimize risk of transmitting infections, such as malaria. The donated blood is tested for signs of infectious diseases, such as HIV and hepatitis. The blood is then tested to be sure it is compatible with you in order to minimize the chance of a transfusion reaction. If you or a relative donates blood, this is often done in anticipation of surgery and is not appropriate for emergency situations. It takes many days to process the donated blood. RISKS AND COMPLICATIONS Although transfusion therapy is very safe and saves many lives, the main dangers of transfusion include:   Getting an infectious disease.  Developing a transfusion reaction. This is an allergic reaction to something in the blood you were given. Every precaution is taken to prevent this. The decision to have a blood transfusion has been considered carefully by your caregiver before blood is given. Blood is not given unless the benefits outweigh the risks. AFTER THE TRANSFUSION  Right after receiving a blood transfusion, you will usually feel much better and more energetic. This is especially true if your red blood cells have  gotten low (anemic). The transfusion raises the level of the red blood cells which carry oxygen, and this usually causes an energy increase.  The nurse administering the transfusion will monitor you carefully for complications. HOME CARE INSTRUCTIONS  No special instructions are needed after a transfusion. You may find your energy is better. Speak with your caregiver about any limitations on activity for underlying diseases you may have. SEEK MEDICAL CARE IF:   Your condition is not improving after your transfusion.  You develop redness or irritation at the intravenous (IV) site. SEEK IMMEDIATE MEDICAL CARE IF:  Any of the following symptoms occur over the next 12 hours:  Shaking chills.  You have a temperature by mouth above 102 F (38.9 C), not controlled by medicine.  Chest, back, or muscle pain.  People around you feel you are not acting correctly or are confused.  Shortness of breath or difficulty breathing.  Dizziness and fainting.  You get a rash or develop hives.  You have a decrease in urine output.  Your urine turns a dark color or changes to pink, red, or brown. Any of the following symptoms occur over the next 10 days:  You have a temperature by mouth above 102 F (38.9 C), not controlled by medicine.  Shortness of breath.  Weakness after normal activity.  The white part of the eye turns yellow (jaundice).  You have a decrease in the amount of urine or are urinating less often.  Your urine turns a dark color or changes to pink, red, or brown. Document Released: 04/30/2000 Document Revised: 07/26/2011 Document Reviewed: 12/18/2007 [facility] Patient Information 2014 [place], [place].  _______________________________________________________________________

## 2017-09-26 ENCOUNTER — Encounter (HOSPITAL_COMMUNITY)
Admission: RE | Admit: 2017-09-26 | Discharge: 2017-09-26 | Disposition: A | Discharge status: Home / Self Care | Payer: Medicaid Other | Source: Ambulatory Visit | Attending: Urology | Admitting: Urology

## 2017-09-26 ENCOUNTER — Other Ambulatory Visit: Payer: Self-pay

## 2017-09-26 ENCOUNTER — Encounter (HOSPITAL_COMMUNITY): Payer: Self-pay

## 2017-09-26 DIAGNOSIS — Z01818 Encounter for other preprocedural examination: Secondary | ICD-10-CM | POA: Insufficient documentation

## 2017-09-26 DIAGNOSIS — Z01812 Encounter for preprocedural laboratory examination: Secondary | ICD-10-CM | POA: Diagnosis not present

## 2017-09-26 DIAGNOSIS — Z0183 Encounter for blood typing: Secondary | ICD-10-CM | POA: Diagnosis not present

## 2017-09-26 DIAGNOSIS — C61 Malignant neoplasm of prostate: Secondary | ICD-10-CM | POA: Diagnosis not present

## 2017-09-26 LAB — CBC
HEMATOCRIT: 39.8 % (ref 39.0–52.0)
Hemoglobin: 12.4 g/dL — ABNORMAL LOW (ref 13.0–17.0)
MCH: 24.4 pg — AB (ref 26.0–34.0)
MCHC: 31.2 g/dL (ref 30.0–36.0)
MCV: 78.3 fL (ref 78.0–100.0)
PLATELETS: 162 10*3/uL (ref 150–400)
RBC: 5.08 MIL/uL (ref 4.22–5.81)
RDW: 17.5 % — AB (ref 11.5–15.5)
WBC: 6.5 10*3/uL (ref 4.0–10.5)

## 2017-09-26 LAB — URINALYSIS, COMPLETE (UACMP) WITH MICROSCOPIC
Bilirubin Urine: NEGATIVE
GLUCOSE, UA: 50 mg/dL — AB
KETONES UR: NEGATIVE mg/dL
NITRITE: NEGATIVE
PH: 5 (ref 5.0–8.0)
PROTEIN: 100 mg/dL — AB
RBC / HPF: >50 RBC/hpf — ABNORMAL HIGH (ref 0–5)
Specific Gravity, Urine: 1.012 (ref 1.005–1.030)

## 2017-09-26 LAB — GLUCOSE, CAPILLARY: Glucose-Capillary: 221 mg/dL — ABNORMAL HIGH (ref 65–99)

## 2017-09-26 LAB — BASIC METABOLIC PANEL
Anion gap: 10 (ref 5–15)
BUN: 7 mg/dL (ref 6–20)
CO2: 23 mmol/L (ref 22–32)
CREATININE: 1.01 mg/dL (ref 0.61–1.24)
Calcium: 8.5 mg/dL — ABNORMAL LOW (ref 8.9–10.3)
Chloride: 107 mmol/L (ref 101–111)
GFR calc Af Amer: >60 mL/min (ref >60)
GFR calc non Af Amer: >60 mL/min (ref >60)
GLUCOSE: 212 mg/dL — AB (ref 65–99)
Potassium: 3.6 mmol/L (ref 3.5–5.1)
SODIUM: 140 mmol/L (ref 135–145)

## 2017-09-26 LAB — PROTIME-INR
INR: 1.09
Prothrombin Time: 14 seconds (ref 11.4–15.2)

## 2017-09-26 LAB — ABO/RH: ABO/RH(D): B POS

## 2017-09-26 NOTE — Progress Notes (Signed)
09-26-17 UA results routed to Dr. Gloriann Loan for review.

## 2017-10-05 MED ORDER — DEXTROSE 5 % IV SOLN
3.0000 g | INTRAVENOUS | Status: AC
  Administered 2017-10-06: 3 g via INTRAVENOUS
  Filled 2017-10-05: qty 3

## 2017-10-06 ENCOUNTER — Ambulatory Visit (HOSPITAL_COMMUNITY): Payer: Medicaid Other | Admitting: Anesthesiology

## 2017-10-06 ENCOUNTER — Encounter (HOSPITAL_COMMUNITY): Payer: Self-pay | Admitting: *Deleted

## 2017-10-06 ENCOUNTER — Encounter (HOSPITAL_COMMUNITY)
Admission: RE | Disposition: A | Discharge status: Home / Self Care | Payer: Self-pay | Source: Ambulatory Visit | Attending: Urology

## 2017-10-06 ENCOUNTER — Observation Stay (HOSPITAL_COMMUNITY)
Admission: RE | Admit: 2017-10-06 | Discharge: 2017-10-07 | Disposition: A | Discharge status: Home / Self Care | Payer: Medicaid Other | Source: Ambulatory Visit | Attending: Urology | Admitting: Urology

## 2017-10-06 ENCOUNTER — Other Ambulatory Visit: Payer: Self-pay

## 2017-10-06 DIAGNOSIS — E119 Type 2 diabetes mellitus without complications: Secondary | ICD-10-CM | POA: Insufficient documentation

## 2017-10-06 DIAGNOSIS — I509 Heart failure, unspecified: Secondary | ICD-10-CM | POA: Insufficient documentation

## 2017-10-06 DIAGNOSIS — F329 Major depressive disorder, single episode, unspecified: Secondary | ICD-10-CM | POA: Diagnosis not present

## 2017-10-06 DIAGNOSIS — Z8546 Personal history of malignant neoplasm of prostate: Secondary | ICD-10-CM

## 2017-10-06 DIAGNOSIS — I429 Cardiomyopathy, unspecified: Secondary | ICD-10-CM | POA: Insufficient documentation

## 2017-10-06 DIAGNOSIS — G4733 Obstructive sleep apnea (adult) (pediatric): Secondary | ICD-10-CM | POA: Diagnosis not present

## 2017-10-06 DIAGNOSIS — I110 Hypertensive heart disease with heart failure: Secondary | ICD-10-CM | POA: Insufficient documentation

## 2017-10-06 DIAGNOSIS — Z79899 Other long term (current) drug therapy: Secondary | ICD-10-CM | POA: Diagnosis not present

## 2017-10-06 DIAGNOSIS — Z888 Allergy status to other drugs, medicaments and biological substances status: Secondary | ICD-10-CM | POA: Diagnosis not present

## 2017-10-06 DIAGNOSIS — C61 Malignant neoplasm of prostate: Secondary | ICD-10-CM | POA: Diagnosis not present

## 2017-10-06 DIAGNOSIS — Z794 Long term (current) use of insulin: Secondary | ICD-10-CM | POA: Insufficient documentation

## 2017-10-06 HISTORY — PX: ROBOT ASSISTED LAPAROSCOPIC RADICAL PROSTATECTOMY: SHX5141

## 2017-10-06 HISTORY — DX: Personal history of malignant neoplasm of prostate: Z85.46

## 2017-10-06 HISTORY — PX: LYMPHADENECTOMY: SHX5960

## 2017-10-06 LAB — GLUCOSE, CAPILLARY
Glucose-Capillary: 181 mg/dL — ABNORMAL HIGH (ref 65–99)
Glucose-Capillary: 228 mg/dL — ABNORMAL HIGH (ref 65–99)
Glucose-Capillary: 205 mg/dL — ABNORMAL HIGH (ref 65–99)

## 2017-10-06 LAB — BASIC METABOLIC PANEL
Anion gap: 9 (ref 5–15)
BUN: 12 mg/dL (ref 6–20)
CALCIUM: 8.2 mg/dL — AB (ref 8.9–10.3)
CHLORIDE: 104 mmol/L (ref 101–111)
CO2: 24 mmol/L (ref 22–32)
CREATININE: 1.52 mg/dL — AB (ref 0.61–1.24)
GFR calc non Af Amer: 48 mL/min — ABNORMAL LOW (ref >60)
GFR, EST AFRICAN AMERICAN: 56 mL/min — AB (ref >60)
GLUCOSE: 237 mg/dL — AB (ref 65–99)
Potassium: 4.6 mmol/L (ref 3.5–5.1)
Sodium: 137 mmol/L (ref 135–145)

## 2017-10-06 LAB — HEMOGLOBIN AND HEMATOCRIT, BLOOD
HEMATOCRIT: 37.8 % — AB (ref 39.0–52.0)
Hemoglobin: 11.7 g/dL — ABNORMAL LOW (ref 13.0–17.0)

## 2017-10-06 LAB — TYPE AND SCREEN
ABO/RH(D): B POS
Antibody Screen: NEGATIVE

## 2017-10-06 SURGERY — PROSTATECTOMY, RADICAL, ROBOT-ASSISTED, LAPAROSCOPIC
Anesthesia: General

## 2017-10-06 MED ORDER — HYDROMORPHONE HCL 1 MG/ML IJ SOLN
INTRAMUSCULAR | Status: AC
  Filled 2017-10-06: qty 1

## 2017-10-06 MED ORDER — SUGAMMADEX SODIUM 500 MG/5ML IV SOLN
INTRAVENOUS | Status: AC
  Filled 2017-10-06: qty 5

## 2017-10-06 MED ORDER — CARVEDILOL 12.5 MG PO TABS
12.5000 mg | ORAL_TABLET | Freq: Once | ORAL | Status: AC
  Administered 2017-10-06: 12.5 mg via ORAL
  Filled 2017-10-06: qty 1

## 2017-10-06 MED ORDER — DEXAMETHASONE SODIUM PHOSPHATE 10 MG/ML IJ SOLN
INTRAMUSCULAR | Status: DC | PRN
  Administered 2017-10-06: 10 mg via INTRAVENOUS

## 2017-10-06 MED ORDER — FENTANYL CITRATE (PF) 100 MCG/2ML IJ SOLN
INTRAMUSCULAR | Status: DC | PRN
  Administered 2017-10-06: 25 ug via INTRAVENOUS
  Administered 2017-10-06 (×2): 50 ug via INTRAVENOUS
  Administered 2017-10-06: 25 ug via INTRAVENOUS
  Administered 2017-10-06: 50 ug via INTRAVENOUS
  Administered 2017-10-06: 25 ug via INTRAVENOUS
  Administered 2017-10-06: 50 ug via INTRAVENOUS
  Administered 2017-10-06: 25 ug via INTRAVENOUS

## 2017-10-06 MED ORDER — LOSARTAN POTASSIUM 50 MG PO TABS
50.0000 mg | ORAL_TABLET | Freq: Every day | ORAL | Status: DC
  Administered 2017-10-07: 50 mg via ORAL
  Filled 2017-10-06: qty 1

## 2017-10-06 MED ORDER — PHENYLEPHRINE HCL 10 MG/ML IJ SOLN
INTRAMUSCULAR | Status: AC
  Filled 2017-10-06: qty 1

## 2017-10-06 MED ORDER — AMLODIPINE BESYLATE 5 MG PO TABS
5.0000 mg | ORAL_TABLET | Freq: Every day | ORAL | Status: DC
  Administered 2017-10-07: 5 mg via ORAL
  Filled 2017-10-06: qty 1

## 2017-10-06 MED ORDER — HYDROMORPHONE HCL 1 MG/ML IJ SOLN
0.2500 mg | INTRAMUSCULAR | Status: DC | PRN
  Administered 2017-10-06 (×2): 0.5 mg via INTRAVENOUS

## 2017-10-06 MED ORDER — PROMETHAZINE HCL 25 MG/ML IJ SOLN: 6.2500 mg | INTRAMUSCULAR | Status: DC | PRN

## 2017-10-06 MED ORDER — DIPHENHYDRAMINE HCL 12.5 MG/5ML PO ELIX
12.5000 mg | ORAL_SOLUTION | Freq: Four times a day (QID) | ORAL | Status: DC | PRN
  Filled 2017-10-06: qty 10

## 2017-10-06 MED ORDER — INSULIN ASPART 100 UNIT/ML ~~LOC~~ SOLN
0.0000 [IU] | Freq: Three times a day (TID) | SUBCUTANEOUS | Status: DC
  Administered 2017-10-06 (×2): 5 [IU] via SUBCUTANEOUS
  Administered 2017-10-07: 3 [IU] via SUBCUTANEOUS
  Administered 2017-10-07: 5 [IU] via SUBCUTANEOUS

## 2017-10-06 MED ORDER — ONDANSETRON HCL 4 MG/2ML IJ SOLN
INTRAMUSCULAR | Status: AC
  Filled 2017-10-06: qty 2

## 2017-10-06 MED ORDER — LIDOCAINE 2% (20 MG/ML) 5 ML SYRINGE
INTRAMUSCULAR | Status: DC | PRN
  Administered 2017-10-06: 100 mg via INTRAVENOUS

## 2017-10-06 MED ORDER — SODIUM CHLORIDE 0.9 % IJ SOLN
INTRAMUSCULAR | Status: AC
  Filled 2017-10-06: qty 20

## 2017-10-06 MED ORDER — SODIUM CHLORIDE 0.9 % IV SOLN
INTRAVENOUS | Status: DC
  Administered 2017-10-07: 13:00:00 via INTRAVENOUS

## 2017-10-06 MED ORDER — LIDOCAINE 2% (20 MG/ML) 5 ML SYRINGE
INTRAMUSCULAR | Status: AC
  Filled 2017-10-06: qty 5

## 2017-10-06 MED ORDER — DOCUSATE SODIUM 100 MG PO CAPS
100.0000 mg | ORAL_CAPSULE | Freq: Two times a day (BID) | ORAL | Status: DC
  Administered 2017-10-06 – 2017-10-07 (×2): 100 mg via ORAL
  Filled 2017-10-06 (×2): qty 1

## 2017-10-06 MED ORDER — FENTANYL CITRATE (PF) 100 MCG/2ML IJ SOLN
INTRAMUSCULAR | Status: AC
  Filled 2017-10-06: qty 2

## 2017-10-06 MED ORDER — ONDANSETRON HCL 4 MG/2ML IJ SOLN
INTRAMUSCULAR | Status: DC | PRN
  Administered 2017-10-06: 4 mg via INTRAVENOUS

## 2017-10-06 MED ORDER — MIDAZOLAM HCL 2 MG/2ML IJ SOLN
INTRAMUSCULAR | Status: AC
  Filled 2017-10-06: qty 2

## 2017-10-06 MED ORDER — ISOSORBIDE MONONITRATE ER 30 MG PO TB24
30.0000 mg | ORAL_TABLET | Freq: Every day | ORAL | Status: DC
  Administered 2017-10-07: 30 mg via ORAL
  Filled 2017-10-06: qty 1

## 2017-10-06 MED ORDER — SENNA 8.6 MG PO TABS
1.0000 | ORAL_TABLET | Freq: Two times a day (BID) | ORAL | Status: DC
  Administered 2017-10-06 – 2017-10-07 (×2): 8.6 mg via ORAL
  Filled 2017-10-06 (×2): qty 1

## 2017-10-06 MED ORDER — DEXTROSE 5 % IV SOLN
INTRAVENOUS | Status: DC | PRN
  Administered 2017-10-06: 5 ug/min via INTRAVENOUS

## 2017-10-06 MED ORDER — CEFAZOLIN SODIUM-DEXTROSE 1-4 GM/50ML-% IV SOLN
1.0000 g | Freq: Three times a day (TID) | INTRAVENOUS | Status: AC
  Administered 2017-10-07 (×2): 1 g via INTRAVENOUS
  Filled 2017-10-06 (×4): qty 50

## 2017-10-06 MED ORDER — EPHEDRINE SULFATE 50 MG/ML IJ SOLN
INTRAMUSCULAR | Status: DC | PRN
  Administered 2017-10-06 (×3): 5 mg via INTRAVENOUS

## 2017-10-06 MED ORDER — OXYCODONE HCL 5 MG PO TABS: 5.0000 mg | ORAL_TABLET | Freq: Once | ORAL | Status: DC | PRN

## 2017-10-06 MED ORDER — MIDAZOLAM HCL 2 MG/2ML IJ SOLN
INTRAMUSCULAR | Status: DC | PRN
  Administered 2017-10-06 (×2): 1 mg via INTRAVENOUS

## 2017-10-06 MED ORDER — PROPOFOL 10 MG/ML IV BOLUS
INTRAVENOUS | Status: AC
  Filled 2017-10-06: qty 20

## 2017-10-06 MED ORDER — SODIUM CHLORIDE 0.9 % IJ SOLN
INTRAMUSCULAR | Status: AC
  Filled 2017-10-06: qty 10

## 2017-10-06 MED ORDER — INSULIN ASPART 100 UNIT/ML ~~LOC~~ SOLN
SUBCUTANEOUS | Status: AC
  Filled 2017-10-06: qty 1

## 2017-10-06 MED ORDER — DIPHENHYDRAMINE HCL 50 MG/ML IJ SOLN: 12.5000 mg | Freq: Four times a day (QID) | INTRAMUSCULAR | Status: DC | PRN

## 2017-10-06 MED ORDER — ACETAMINOPHEN 325 MG PO TABS: 650.0000 mg | ORAL_TABLET | ORAL | Status: DC | PRN

## 2017-10-06 MED ORDER — FUROSEMIDE 10 MG/ML IJ SOLN
INTRAMUSCULAR | Status: AC
  Filled 2017-10-06: qty 4

## 2017-10-06 MED ORDER — STERILE WATER FOR IRRIGATION IR SOLN
Status: DC | PRN
  Administered 2017-10-06: 1000 mL

## 2017-10-06 MED ORDER — AMIODARONE HCL 200 MG PO TABS
200.0000 mg | ORAL_TABLET | Freq: Every day | ORAL | Status: DC
  Administered 2017-10-06 – 2017-10-07 (×2): 200 mg via ORAL
  Filled 2017-10-06 (×2): qty 1

## 2017-10-06 MED ORDER — MEPERIDINE HCL 50 MG/ML IJ SOLN: 6.2500 mg | INTRAMUSCULAR | Status: DC | PRN

## 2017-10-06 MED ORDER — SUGAMMADEX SODIUM 500 MG/5ML IV SOLN
INTRAVENOUS | Status: DC | PRN
  Administered 2017-10-06: 300 mg via INTRAVENOUS

## 2017-10-06 MED ORDER — LACTATED RINGERS IR SOLN
Status: DC | PRN
  Administered 2017-10-06: 1000 mL

## 2017-10-06 MED ORDER — DULOXETINE HCL 60 MG PO CPEP
60.0000 mg | ORAL_CAPSULE | Freq: Every day | ORAL | Status: DC
  Administered 2017-10-07: 60 mg via ORAL
  Filled 2017-10-06: qty 1

## 2017-10-06 MED ORDER — OXYCODONE HCL 5 MG/5ML PO SOLN: 5.0000 mg | Freq: Once | ORAL | Status: DC | PRN

## 2017-10-06 MED ORDER — SODIUM CHLORIDE 0.9 % IJ SOLN
INTRAMUSCULAR | Status: DC | PRN
  Administered 2017-10-06: 20 mL

## 2017-10-06 MED ORDER — ROCURONIUM BROMIDE 10 MG/ML (PF) SYRINGE
PREFILLED_SYRINGE | INTRAVENOUS | Status: AC
  Filled 2017-10-06: qty 5

## 2017-10-06 MED ORDER — HYDROMORPHONE HCL 1 MG/ML IJ SOLN
0.5000 mg | INTRAMUSCULAR | Status: DC | PRN
  Administered 2017-10-06: 1 mg via INTRAVENOUS
  Filled 2017-10-06: qty 1

## 2017-10-06 MED ORDER — ROCURONIUM BROMIDE 10 MG/ML (PF) SYRINGE
PREFILLED_SYRINGE | INTRAVENOUS | Status: DC | PRN
  Administered 2017-10-06: 20 mg via INTRAVENOUS
  Administered 2017-10-06 (×2): 10 mg via INTRAVENOUS
  Administered 2017-10-06: 50 mg via INTRAVENOUS
  Administered 2017-10-06 (×2): 10 mg via INTRAVENOUS
  Administered 2017-10-06: 5 mg via INTRAVENOUS

## 2017-10-06 MED ORDER — PROPOFOL 10 MG/ML IV BOLUS
INTRAVENOUS | Status: DC | PRN
  Administered 2017-10-06: 40 mg via INTRAVENOUS
  Administered 2017-10-06: 160 mg via INTRAVENOUS

## 2017-10-06 MED ORDER — DEXTROSE 5 % IV SOLN
3.0000 g | INTRAVENOUS | Status: AC
  Administered 2017-10-06: 3 g via INTRAVENOUS
  Filled 2017-10-06: qty 3

## 2017-10-06 MED ORDER — SODIUM CHLORIDE 0.9 % IJ SOLN
INTRAMUSCULAR | Status: AC
  Filled 2017-10-06: qty 50

## 2017-10-06 MED ORDER — INDIGOTINDISULFONATE SODIUM 8 MG/ML IJ SOLN
INTRAMUSCULAR | Status: DC | PRN
  Administered 2017-10-06: 5 mL via INTRAVENOUS

## 2017-10-06 MED ORDER — OXYCODONE HCL 5 MG PO TABS
5.0000 mg | ORAL_TABLET | ORAL | Status: DC | PRN
  Administered 2017-10-07: 5 mg via ORAL
  Filled 2017-10-06: qty 1

## 2017-10-06 MED ORDER — LACTATED RINGERS IV SOLN
INTRAVENOUS | Status: DC
  Administered 2017-10-06 (×2): via INTRAVENOUS

## 2017-10-06 MED ORDER — FUROSEMIDE 40 MG PO TABS
40.0000 mg | ORAL_TABLET | Freq: Two times a day (BID) | ORAL | Status: DC
  Administered 2017-10-07: 40 mg via ORAL
  Filled 2017-10-06: qty 1

## 2017-10-06 MED ORDER — BACITRACIN-NEOMYCIN-POLYMYXIN 400-5-5000 EX OINT
1.0000 "application " | TOPICAL_OINTMENT | Freq: Three times a day (TID) | CUTANEOUS | Status: DC | PRN
  Filled 2017-10-06: qty 1

## 2017-10-06 MED ORDER — EPHEDRINE SULFATE 50 MG/ML IJ SOLN
INTRAMUSCULAR | Status: AC
  Filled 2017-10-06: qty 1

## 2017-10-06 MED ORDER — BOOST / RESOURCE BREEZE PO LIQD CUSTOM
1.0000 | Freq: Three times a day (TID) | ORAL | Status: DC
  Administered 2017-10-06 – 2017-10-07 (×2): 1 via ORAL

## 2017-10-06 MED ORDER — ZOLPIDEM TARTRATE 5 MG PO TABS: 5.0000 mg | ORAL_TABLET | Freq: Every evening | ORAL | Status: DC | PRN

## 2017-10-06 MED ORDER — BUPIVACAINE LIPOSOME 1.3 % IJ SUSP
20.0000 mL | Freq: Once | INTRAMUSCULAR | Status: AC
  Administered 2017-10-06: 20 mL
  Filled 2017-10-06: qty 20

## 2017-10-06 MED ORDER — OXYBUTYNIN CHLORIDE 5 MG PO TABS: 5.0000 mg | ORAL_TABLET | Freq: Three times a day (TID) | ORAL | Status: DC | PRN

## 2017-10-06 MED ORDER — GABAPENTIN 300 MG PO CAPS
300.0000 mg | ORAL_CAPSULE | Freq: Every day | ORAL | Status: DC
  Administered 2017-10-07: 300 mg via ORAL
  Filled 2017-10-06: qty 1

## 2017-10-06 MED ORDER — INDIGOTINDISULFONATE SODIUM 8 MG/ML IJ SOLN
INTRAMUSCULAR | Status: AC
  Filled 2017-10-06: qty 5

## 2017-10-06 MED ORDER — ATORVASTATIN CALCIUM 40 MG PO TABS
40.0000 mg | ORAL_TABLET | Freq: Every day | ORAL | Status: DC
  Administered 2017-10-07: 40 mg via ORAL
  Filled 2017-10-06: qty 1

## 2017-10-06 MED ORDER — CARVEDILOL 12.5 MG PO TABS
12.5000 mg | ORAL_TABLET | Freq: Two times a day (BID) | ORAL | Status: DC
  Administered 2017-10-07: 12.5 mg via ORAL
  Filled 2017-10-06: qty 1

## 2017-10-06 MED ORDER — HYDROMORPHONE HCL 2 MG/ML IJ SOLN
INTRAMUSCULAR | Status: AC
  Filled 2017-10-06: qty 1

## 2017-10-06 MED ORDER — DEXAMETHASONE SODIUM PHOSPHATE 10 MG/ML IJ SOLN
INTRAMUSCULAR | Status: AC
  Filled 2017-10-06: qty 1

## 2017-10-06 MED ORDER — ONDANSETRON HCL 4 MG/2ML IJ SOLN: 4.0000 mg | INTRAMUSCULAR | Status: DC | PRN

## 2017-10-06 SURGICAL SUPPLY — 57 items
APPLICATOR COTTON TIP 6IN STRL (MISCELLANEOUS) ×4 IMPLANT
APPLICATOR SURGIFLO ENDO (HEMOSTASIS) ×4 IMPLANT
CATH FOLEY 2WAY SLVR  5CC 18FR (CATHETERS) ×2
CATH FOLEY 2WAY SLVR 5CC 18FR (CATHETERS) ×2 IMPLANT
CATH TIEMANN FOLEY 18FR 5CC (CATHETERS) ×4 IMPLANT
CHLORAPREP W/TINT 26ML (MISCELLANEOUS) ×4 IMPLANT
CLIP VESOLOCK LG 6/CT PURPLE (CLIP) ×8 IMPLANT
COVER SURGICAL LIGHT HANDLE (MISCELLANEOUS) ×4 IMPLANT
COVER TIP SHEARS 8 DVNC (MISCELLANEOUS) ×2 IMPLANT
COVER TIP SHEARS 8MM DA VINCI (MISCELLANEOUS) ×2
CUTTER ECHEON FLEX ENDO 45 340 (ENDOMECHANICALS) ×4 IMPLANT
DECANTER SPIKE VIAL GLASS SM (MISCELLANEOUS) ×4 IMPLANT
DERMABOND ADVANCED (GAUZE/BANDAGES/DRESSINGS) ×2
DERMABOND ADVANCED .7 DNX12 (GAUZE/BANDAGES/DRESSINGS) ×2 IMPLANT
DRAPE ARM DVNC X/XI (DISPOSABLE) ×8 IMPLANT
DRAPE COLUMN DVNC XI (DISPOSABLE) ×2 IMPLANT
DRAPE DA VINCI XI ARM (DISPOSABLE) ×8
DRAPE DA VINCI XI COLUMN (DISPOSABLE) ×2
DRAPE SURG IRRIG POUCH 19X23 (DRAPES) ×4 IMPLANT
DRSG TEGADERM 4X4.75 (GAUZE/BANDAGES/DRESSINGS) ×4 IMPLANT
ELECT REM PT RETURN 15FT ADLT (MISCELLANEOUS) ×4 IMPLANT
FLOSEAL 10ML (HEMOSTASIS) ×4 IMPLANT
GLOVE BIO SURGEON STRL SZ 6.5 (GLOVE) ×3 IMPLANT
GLOVE BIO SURGEONS STRL SZ 6.5 (GLOVE) ×1
GLOVE BIOGEL M STRL SZ7.5 (GLOVE) ×8 IMPLANT
GOWN STRL REUS W/TWL LRG LVL3 (GOWN DISPOSABLE) ×16 IMPLANT
HEMOSTAT SURGICEL 4X8 (HEMOSTASIS) IMPLANT
HOLDER FOLEY CATH W/STRAP (MISCELLANEOUS) ×4 IMPLANT
IRRIG SUCT STRYKERFLOW 2 WTIP (MISCELLANEOUS) ×4
IRRIGATION SUCT STRKRFLW 2 WTP (MISCELLANEOUS) ×2 IMPLANT
NEEDLE INSUFFLATION 14GA 120MM (NEEDLE) ×4 IMPLANT
PACK ROBOT UROLOGY CUSTOM (CUSTOM PROCEDURE TRAY) ×4 IMPLANT
PAD POSITIONING PINK XL (MISCELLANEOUS) ×4 IMPLANT
PORT ACCESS TROCAR AIRSEAL 12 (TROCAR) ×2 IMPLANT
PORT ACCESS TROCAR AIRSEAL 5M (TROCAR) ×2
SEAL CANN UNIV 5-8 DVNC XI (MISCELLANEOUS) ×8 IMPLANT
SEAL XI 5MM-8MM UNIVERSAL (MISCELLANEOUS) ×8
SET TRI-LUMEN FLTR TB AIRSEAL (TUBING) ×4 IMPLANT
SOLUTION ELECTROLUBE (MISCELLANEOUS) ×4 IMPLANT
SPONGE LAP 4X18 RFD (DISPOSABLE) ×4 IMPLANT
STAPLE RELOAD 45 GRN (STAPLE) ×2 IMPLANT
STAPLE RELOAD 45MM GREEN (STAPLE) ×2
SUT ETHILON 3 0 PS 1 (SUTURE) ×4 IMPLANT
SUT MNCRL AB 4-0 PS2 18 (SUTURE) ×8 IMPLANT
SUT VIC AB 0 UR5 27 (SUTURE) ×4 IMPLANT
SUT VIC AB 2-0 SH 27 (SUTURE) ×2
SUT VIC AB 2-0 SH 27X BRD (SUTURE) ×2 IMPLANT
SUT VIC AB 3-0 SH 27 (SUTURE) ×2
SUT VIC AB 3-0 SH 27XBRD (SUTURE) ×2 IMPLANT
SUT VICRYL 0 UR6 27IN ABS (SUTURE) ×8 IMPLANT
SUT VLOC BARB 180 ABS3/0GR12 (SUTURE) ×16
SUTURE VLOC BRB 180 ABS3/0GR12 (SUTURE) ×8 IMPLANT
SYR 27GX1/2 1ML LL SAFETY (SYRINGE) IMPLANT
TOWEL OR 17X26 10 PK STRL BLUE (TOWEL DISPOSABLE) ×4 IMPLANT
TOWEL OR NON WOVEN STRL DISP B (DISPOSABLE) ×4 IMPLANT
TUBING INSUFFLATION 10FT LAP (TUBING) IMPLANT
WATER STERILE IRR 1000ML POUR (IV SOLUTION) ×4 IMPLANT

## 2017-10-06 NOTE — Anesthesia Postprocedure Evaluation (Signed)
Anesthesia Post Note  Patient: Terry Parks  Procedure(s) Performed: XI ROBOTIC ASSISTED LAPAROSCOPIC RADICAL PROSTATECTOMY (N/A ) LYMPHADENECTOMY (Bilateral )     Patient location during evaluation: PACU Anesthesia Type: General Level of consciousness: sedated and patient cooperative Pain management: pain level controlled Vital Signs Assessment: post-procedure vital signs reviewed and stable Respiratory status: spontaneous breathing Cardiovascular status: stable Anesthetic complications: no    Last Vitals:  Vitals:   10/06/17 1930 10/06/17 2000  BP: (!) 148/95 (!) 161/100  Pulse: 76 73  Resp: 16 16  Temp: 36.7 C (!) 36.4 C  SpO2: 100% 100%    Last Pain:  Vitals:   10/06/17 2007  TempSrc:   PainSc: [name]

## 2017-10-06 NOTE — H&P (Signed)
H&P CC: I have prostate cancer. HPI: Terry Parks is a 62 year-old male established patient who is here evaluation for treatment of prostate cancer.  His prostate cancer was diagnosed 08/04/2017. He does have the pathology report from his biopsy. His cancer was diagnosed by Dr Gloriann Loan. His PSA at his time of diagnosis was 8.   He has not undergone surgery for treatment.   He does not have problems with erectile dysfunction. He has not recently had unwanted weight loss. He is not having pain in new locations.   The patient was initially seen for hematuria and urinary retention. Ultrasound had revealed a potential bladder mass and cystoscopy in the office revealed concern for potential bladder tumor within the operating room, this was proven to be a very large median lobe which was resected. The patient failed his initial voiding trial. He had a PSA of 8. That reason, he underwent a transrectal ultrasound-guided needle biopsy. This unfortunately revealed Gleason 4+3 adenocarcinoma of the prostate in 5% of one core. Prostate size was 160 g and this was after resection of the median lobe. Pathology from his median lobe and partial lateral lobe resections was negative and I resected about 30 g of prostate tissue. The patient does take xarelto. This is for atrial fibrillation. He has never had an MI. Other comorbidities include diabetes and hypertension. He is obese.   No erectile dysfunction  He has urinary retention  PSA is 8  Prostate size 160 g. Median lobe has been resected. Bilateral ureteral orifices were not identified during the case due to the size of his prostate   Patient presents today for robotic-assisted laparoscopic prostatectomy with possible bilateral pelvic lymph node dissection.  Past Medical History:  Diagnosis Date  . Abscessed tooth    top back large cavity no pain or drainage, one on bottom  pt pulled tooth 4-5 months ago, right top large hole in tooth  . Allergic rhinitis    . BPH (benign prostatic hypertrophy)    Massive BPH noted on cystoscopy 1/23/ 2012 by Dr. Risa Grill.  . Cardiomyopathy ([place])   . CHF (congestive heart failure) ([place])   . Depression   . Diabetes mellitus 04/08/2008   type 2  . Foley catheter in place 07-05-17 placed  . Headache(784.0)    hx migraines none recent  . Hyperlipemia   . Hypertension   . Hypertensive cardiopathy 03/01/2006   2-D echocardiogram 02/01/2012 showed moderate LVH, mildly to moderately reduced left ventricular systolic function with an estimated ejection fraction of 40-45%, and diffuse hypokinesis.  A nuclear medicine stress study done 01/31/2012 showed no reversible ischemia, a small mid anterior wall fixed defect/infarct, and ejection fraction 42%.      . Neck pain   . Obstructive sleep apnea 03/06/2008   Sleep study 03/06/08 showed severe OSA/hypopnea syndrome, with successful CPAP titration to 13 CWP using a medium ResMed Mirage Quattro full face mask with heated humidifier.   . Renal calculus 05/29/2010   CT scan of abdomen/pelvis on 05/29/2010 showed an obstructing approximate 1-2 mm calculus at the left UVJ, and an approximate 1-2 mm left lower pole renal calculus.   Patient had continuing severe pain , and an elevation of his serum creatinine to a value of 1.75 on 06/06/2010.  The stone had apparently passed and was not seen on repeat CT 06/08/2010.   Past Surgical History:  Procedure Laterality Date  . big toe nails removed Bilateral 20 yrs ago  . CARDIOVERSION N/A 06/08/2017  Procedure: CARDIOVERSION;  Surgeon: Josue Hector, MD;  Location: [facility] ENDOSCOPY;  Service: Cardiovascular;  Laterality: N/A;  . CYSTOSCOPY W/ RETROGRADES    . TRANSURETHRAL RESECTION OF BLADDER TUMOR N/A 07/13/2017   Procedure: TRANSURETHRAL RESECTION OF PROSTATE;  Surgeon: Lucas Mallow, MD;  Location: [facility] ORS;  Service: Urology;  Laterality: N/A;    Home Medications:  Medications Prior to Admission  Medication Sig Dispense Refill Last  Dose  . amiodarone (PACERONE) 200 MG tablet Take 1 tablet (200 mg total) by mouth daily. (Patient taking differently: Take 200 mg by mouth daily. ) 90 tablet 0 Past Week at Unknown time  . amLODipine (NORVASC) 5 MG tablet Take 1 tablet (5 mg total) by mouth daily. (Patient taking differently: Take 5 mg by mouth daily. ) 90 tablet 2 Past Week at Unknown time  . atorvastatin (LIPITOR) 40 MG tablet Take 1 tablet (40 mg total) by mouth daily. 90 tablet 3 Past Week at Unknown time  . carvedilol (COREG) 12.5 MG tablet Take 1 tablet (12.5 mg total) by mouth 2 (two) times daily with a meal. 180 tablet 0 Past Week at Unknown time  . DULoxetine (CYMBALTA) 60 MG capsule Take 1 capsule (60 mg total) by mouth daily. (Patient taking differently: Take 60 mg by mouth daily. ) 90 capsule 1 Past Week at Unknown time  . Empagliflozin-metFORMIN HCl ER (SYNJARDY XR) 09-998 MG TB24 Take 2 tablets by mouth daily. (Patient taking differently: Take 1 tablet by mouth 2 (two) times daily. ) 60 tablet 11 Past Week at Unknown time  . furosemide (LASIX) 40 MG tablet Take 1 tablet (40 mg total) by mouth 2 (two) times daily. 180 tablet 0 Past Week at Unknown time  . gabapentin (NEURONTIN) 300 MG capsule Take 1 capsule (300 mg total) by mouth daily. 30 capsule 0 Past Week at Unknown time  . insulin glargine (LANTUS) 100 UNIT/ML injection Inject 60 Units into the skin daily.    Past Week at Unknown time  . isosorbide mononitrate (IMDUR) 30 MG 24 hr tablet Take 1 tablet (30 mg total) by mouth daily. (Patient taking differently: Take 30 mg by mouth daily. ) 90 tablet 3 Past Week at Unknown time  . losartan (COZAAR) 50 MG tablet Take 1 tablet (50 mg total) by mouth daily. (Patient taking differently: Take 50 mg by mouth daily. ) 90 tablet 3 Past Week at Unknown time  . tamsulosin (FLOMAX) 0.4 MG CAPS capsule Take 0.4 mg by mouth daily.   Past Week at Unknown time  . glucose blood (TRUETRACK TEST) test strip Use as directed to test blood  sugar three times a day before meals. 100 each 1 Taking  . HYDROcodone-acetaminophen (NORCO) 5-325 MG tablet Take 1 tablet by mouth every 4 (four) hours as needed for moderate pain. 10 tablet 0 Unknown at Unknown time  . Insulin Glargine (BASAGLAR KWIKPEN) 100 UNIT/ML SOPN Inject 0.6 mLs (60 Units total) into the skin daily. (Patient not taking: Reported on 09/23/2017) 45 mL 3 Not Taking at Unknown time  . Insulin Pen Needle (CARETOUCH PEN NEEDLES) 31G X 6 MM MISC 1 pen by Does not apply route at bedtime. 100 each 3 Taking  . Lancets MISC Use to test blood sugar three times a day before meals. 100 each 1 Taking  . Misc. Devices MISC by Does not apply route. C-PAP   Taking   Allergies:  Allergies  Allergen Reactions  . Lisinopril Cough    Family History  Problem  Relation Age of Onset  . Breast cancer Mother   . Hypertension Father   . Diabetes Maternal Grandmother   . Colon cancer Neg Hx   . Prostate cancer Neg Hx   . Heart attack Neg Hx    Social History:  reports that he has never smoked. He has never used smokeless tobacco. He reports that he does not drink alcohol or use drugs.  ROS: A complete review of systems was performed.  All systems are negative except for pertinent findings as noted. ROS   Physical Exam:  Vital signs in last 24 hours: Temp:  [97.6 F (36.4 C)] 97.6 F (36.4 C) (05/23 0954) Pulse Rate:  [79] 79 (05/23 0954) Resp:  [20] 20 (05/23 0954) BP: (178-182)/(109-113) 178/109 (05/23 1010) SpO2:  [100 %] 100 % (05/23 0954) Weight:  [134.3 kg (296 lb)] 134.3 kg (296 lb) (05/23 1009) General:  Alert and oriented, No acute distress HEENT: Normocephalic, atraumatic Neck: No JVD or lymphadenopathy Cardiovascular: Regular rate and rhythm Lungs: Regular rate and effort Abdomen: Soft, nontender, nondistended, no abdominal masses Back: No CVA tenderness Extremities: No edema Neurologic: Grossly intact  Laboratory Data:  Results for orders placed or performed  during the hospital encounter of 10/06/17 (from the past 24 hour(s))  Glucose, capillary     Status: Abnormal   Collection Time: 10/06/17 10:04 AM  Result Value Ref Range   Glucose-Capillary 181 (H) 65 - 99 mg/dL   No results found for this or any previous visit (from the past 240 hour(s)). Creatinine: No results for input(s): CREATININE in the last 168 hours.  Impression/Assessment:  Prostate cancer  Plan:  Proceed with robotic-assisted laparoscopic prostatectomy with possible bilateral pelvic lymph node dissection  Marton Redwood, III 10/06/2017, 11:26 AM

## 2017-10-06 NOTE — Transfer of Care (Signed)
Immediate Anesthesia Transfer of Care Note  Patient: Terry Parks  Procedure(s) Performed: XI ROBOTIC ASSISTED LAPAROSCOPIC RADICAL PROSTATECTOMY (N/A ) LYMPHADENECTOMY (Bilateral )  Patient Location: PACU  Anesthesia Type:General  Level of Consciousness: awake, alert , oriented and patient cooperative  Airway & Oxygen Therapy: Patient Spontanous Breathing and Patient connected to face mask oxygen  Post-op Assessment: Report given to RN, Post -op Vital signs reviewed and stable and Patient moving all extremities  Post vital signs: stable  Last Vitals:  Vitals Value Taken Time  BP 137/96 10/06/2017  5:49 PM  Temp    Pulse 77 10/06/2017  5:55 PM  Resp 15 10/06/2017  5:55 PM  SpO2 100 % 10/06/2017  5:55 PM  Vitals shown include unvalidated device data.  Last Pain:  Vitals:   10/06/17 0954  TempSrc: Oral         Complications: No apparent anesthesia complications

## 2017-10-06 NOTE — Anesthesia Preprocedure Evaluation (Signed)
Anesthesia Evaluation  Patient identified by MRN, date of birth, ID band Patient awake    Reviewed: Allergy & Precautions, NPO status , Patient's Chart, lab work & pertinent test results  Airway Mallampati: II  TM Distance: >3 FB Neck ROM: Full    Dental  (+) Teeth Intact, Dental Advisory Given   Pulmonary    breath sounds clear to auscultation       Cardiovascular hypertension,  Rhythm:Regular Rate:Normal     Neuro/Psych    GI/Hepatic   Endo/Other  diabetes  Renal/GU      Musculoskeletal   Abdominal   Peds  Hematology   Anesthesia Other Findings   Reproductive/Obstetrics                             Anesthesia Physical  Anesthesia Plan  ASA: III  Anesthesia Plan: General   Post-op Pain Management:    Induction: Intravenous  PONV Risk Score and Plan: 2 and Ondansetron and Midazolam  Airway Management Planned: Oral ETT  Additional Equipment:   Intra-op Plan:   Post-operative Plan: Extubation in OR  Informed Consent: I have reviewed the patients History and Physical, chart, labs and discussed the procedure including the risks, benefits and alternatives for the proposed anesthesia with the patient or authorized representative who has indicated his/her understanding and acceptance.   Dental advisory given  Plan Discussed with: CRNA and Anesthesiologist  Anesthesia Plan Comments:         Anesthesia Quick Evaluation

## 2017-10-06 NOTE — Anesthesia Procedure Notes (Addendum)
Procedure Name: Intubation Date/Time: 10/06/2017 12:50 PM Performed by: Dione Booze, CRNA Pre-anesthesia Checklist: Suction available, Patient being monitored, Emergency Drugs available and Patient identified Patient Re-evaluated:Patient Re-evaluated prior to induction Oxygen Delivery Method: Circle system utilized Preoxygenation: Pre-oxygenation with 100% oxygen Induction Type: IV induction Ventilation: Mask ventilation without difficulty and Oral airway inserted - appropriate to patient size Laryngoscope Size: Mac and 4 Grade View: Grade II Tube type: Oral Tube size: 7.5 mm Number of attempts: 1 Airway Equipment and Method: Stylet Placement Confirmation: ETT inserted through vocal cords under direct vision and positive ETCO2 Secured at: 22 cm Tube secured with: Tape Dental Injury: Teeth and Oropharynx as per pre-operative assessment  Comments: No change in dentition.

## 2017-10-06 NOTE — Op Note (Signed)
Operative Note  Preoperative diagnosis:  1.  Prostate cancer   Postoperative diagnosis: 1.  Prostate cancer  Procedure(s): 1.  Robotic assisted laparoscopic prostatectomy with bilateral pelvic lymph node dissection  Surgeon: Link Snuffer, MD  Assistants: Charlett Lango assistant was needed due to the nature of the case being a robotic surgery requiring a bedside assistant for retraction, suctioning, exchanging instruments, etc.   Anesthesia: General   Complications: None   EBL: 150 cc   Specimens: 1. prostate and seminal vesicles 2.  Periprosthetic fat 3.  Right pelvic lymph node packet 4.  Left pelvic lymph node packet  Drains/Catheters: 1. 55 French Foley catheter and JP drain   Intraoperative findings: prostate and seminal vesicles were removed en bloc Without any evidence of gross extraprostatic disease  Indication: 62 year old male who originally presented with urinary retention and a large bladder mass.  In the operating room he was found to have just a large intravesical median lobe which was resected.  This brought him out of retention but he was also found to have an elevated PSA.  Prostate biopsy revealed Gleason 4+3 adenocarcinoma the prostate in 5% of 1 core.  Given his large prostate volume, history of urinary retention, and the finding of intermediate risk prostate cancer, the decision was made to proceed with the above operation  Description of procedure:  The patient was identified and consent was obtained.  The patient was taken to the operating room and placed in the supine position.  The patient was placed under general anesthesia.  Perioperative antibiotics were administered.  The patient was placed in dorsal lithotomy.  Patient was prepped and draped in a standard sterile fashion and a timeout was performed.  The patient was placed in steep Trendelenburg.  Foley catheter was placed.  Veress needle was inserted supraumbilical and the drop test was performed  with no evidence of any injury.  The abdomen was then insufflated to a pressure of 15.  4 robotic working ports, a 12 mm assistant port, and a 5 mm assistant port were placed under direct visualization in a standard fashion for a robotic pelvic surgery.  The abdomen was inspected and there was no evidence of any visceral or vascular injury.  I first released colonic adhesions in the left lower quadrant.  I then retracted the sigmoid and rectum superiorly.  I first started with the posterior dissection by incising along the posterior peritoneum and identifying bilateral vas deferens.  These were divided and bilateral seminal vesicles were carefully dissected out.  Denonvier fascia was then entered posteriorly.  The bladder was then dropped by incising along the medial umbilical ligament bilaterally.  Periprosthetic fat was cleared off the prostate and passed off and passed off for specimen.  The endopelvic fascia was incised bilaterally and the puboprostatic ligaments were released.  A stapler with a vascular staple load was used to staple the dorsal venous complex.  I then incised along into the bladder neck and divided the bladder neck.  The catheter was brought out and used for retraction.  I divided the remainder of the bladder neck and then pulled the seminal vesicles out of this incision.  Careful dissection was performed and bilateral prostatic pedicles were released using Hem-o-lok clips and sharp dissection.  Spot cautery was sparingly used.  Periprosthetic tissue was carefully released inferiorly and laterally, performing a nerve sparing operation bilaterally.  Once only the urethra remained attached, the anterior portion of the urethra was divided.  A 2-0 Vicryl stitch was placed  at the 6 o'clock position.  The remainder of the urethra was divided and the prostate was placed in a specimen bag.  FloSeal was applied to the prostatectomy bed.  I then performed the bilateral pelvic lymph node dissection.   I first carefully removed lymph node packet from the right side overlying the external iliac artery and vein down to the level of the obturator nerve.  This was passed off for specimen and then I carefully removed the left sided pelvic lymph node packets again overlying the external iliac artery and vein down to the level of the obturator nerve taking care not to injure any vital structures.  The 2-0 Vicryl stitch was used to reapproximate the bladder and urethra at the 6 o'clock position.  A running 3-0 V lock suture was then used to reapproximate the bladder and urethra.  The bladder was filled with normal saline and there was no evidence of any leak at the anastomosis.  The anastomosis was watertight.  The Foley catheter was exchanged for a fresh catheter.  A JP drain was inserted from the left lateral port site.  This was secured down with a nylon stitch.  The robot was undocked and the patient was taken out of Trendelenburg.  All working ports were removed under direct visualization with the camera to ensure there was no bleeding from the sites.  The midline port incision was extended and the prostate extracted in the specimen bag.  Interrupted figure-of-eight 0 Vicryl sutures were used to close  the fascia.  The 12 mm assistant port fascia was also closed with a 2-0 Vicryl.  Skin was then closed with 4-0 Monocryl and Dermabond.  Exparel was used for anesthetic effect.  This concluded the operation.  The patient tolerated procedure well and was stable postoperatively.  Plan: Will obtain stat labs.  He will remain in the hospital overnight and hopefully be able to be discharged tomorrow.  He will keep his catheter for 7-10 days.

## 2017-10-06 NOTE — Progress Notes (Signed)
Dr. Lissa Hoard informed of CBG, instructed to cover with Sliding scale as ordered

## 2017-10-07 ENCOUNTER — Encounter (HOSPITAL_COMMUNITY): Payer: Self-pay | Admitting: Urology

## 2017-10-07 DIAGNOSIS — C61 Malignant neoplasm of prostate: Secondary | ICD-10-CM | POA: Diagnosis not present

## 2017-10-07 LAB — BASIC METABOLIC PANEL
Anion gap: 10 (ref 5–15)
BUN: 16 mg/dL (ref 6–20)
CHLORIDE: 103 mmol/L (ref 101–111)
CO2: 23 mmol/L (ref 22–32)
Calcium: 8 mg/dL — ABNORMAL LOW (ref 8.9–10.3)
Creatinine, Ser: 1.28 mg/dL — ABNORMAL HIGH (ref 0.61–1.24)
GFR calc Af Amer: >60 mL/min (ref >60)
GFR calc non Af Amer: 60 mL/min — ABNORMAL LOW (ref >60)
GLUCOSE: 326 mg/dL — AB (ref 65–99)
Potassium: 4.3 mmol/L (ref 3.5–5.1)
Sodium: 136 mmol/L (ref 135–145)

## 2017-10-07 LAB — GLUCOSE, CAPILLARY
GLUCOSE-CAPILLARY: 248 mg/dL — AB (ref 65–99)
GLUCOSE-CAPILLARY: 233 mg/dL — AB (ref 65–99)

## 2017-10-07 LAB — HEMOGLOBIN AND HEMATOCRIT, BLOOD
HCT: 38.4 % — ABNORMAL LOW (ref 39.0–52.0)
HEMOGLOBIN: 12 g/dL — AB (ref 13.0–17.0)

## 2017-10-07 LAB — CREATININE, FLUID (PLEURAL, PERITONEAL, JP DRAINAGE): Creat, Fluid: 1.3 mg/dL

## 2017-10-07 MED ORDER — OXYCODONE HCL 5 MG PO TABS: 5.0000 mg | ORAL_TABLET | ORAL | 0 refills | Status: DC | PRN

## 2017-10-07 NOTE — Discharge Instructions (Signed)

## 2017-10-07 NOTE — Progress Notes (Signed)
Assumed care of Pt from previous nurse. Agree with previous nurses assessment. Will continue to monitor.

## 2017-10-07 NOTE — Progress Notes (Signed)
Initial Nutrition Assessment  DOCUMENTATION CODES:   Obesity unspecified  INTERVENTION:   - Continue Boost Breeze po TID, each supplement provides 250 kcal and 9 grams of protein (pt prefers Clinical cytogeneticist)  - Once [employer] is advanced to Full Liquids, recommend d/c Boost Breeze and ordering Premier Protein BID, each provides 160 kcal and 30 grams protein for better blood glucose control  NUTRITION DIAGNOSIS:   Increased nutrient needs related to cancer and cancer related treatments, post-op healing as evidenced by estimated needs.  GOAL:   Patient will meet greater than or equal to 90% of their needs  MONITOR:   PO intake, Supplement acceptance, Diet advancement, Skin, I & O's, Labs, Weight trends  REASON FOR ASSESSMENT:   Malnutrition Screening Tool    ASSESSMENT:   62 year old male who presented on 10/06/17 for robotic-assisted laparoscopic prostatectomy with bilateral pelvic lymph node dissection. PMH significant for prostate cancer, type 2 diabetes mellitus, CHF, hyperlipidemia, and hypertension.  Spoke with pt at bedside who reports having lost weight due to fluid loss. Pt reports that is "appetite is back" and that he typically eats 2 meals daily. Breakfast includes a breakfast sandwich. Dinner varies but may include a grilled chicken salad. Pt reports snacking on fruit (oranges, apples, grapes, bananas) and nuts. Pt has been trying to eat oatmeal for breakfast. Pt identifies himself as the "snack master" specifically regarding Little Debbie cakes and Nabs crackers but reports that he is trying to eat more nutritious food. Pt also reports starting to walk for physical activity.  Pt reports his UBW of 310 lbs. Bed weight obtained at time of visit: 303.2 lbs. Per weight history in chart, pt has lost 4.5% of his body weight in 5 months which is not significant for timeframe and likely related to fluid shifts.  Pt denies any difficulty chewing or swallowing. Pt also denies  nausea/vomiting. Pt likes the Boost Breeze supplements. RD will continue order while pt is on clear liquid diet to maximize protein intake.  Medications reviewed and include: 100 mg Colace BID, Boost Breeze TID, 40 mg Lasix BID, sliding scale Novolog, Senokot daily  Labs reviewed: creatinine 1.28 (H), hemoglobin 12.0 (L), HCT 38.4 (L) CBG's: 248, 205, 228, 181 x 24 hours  UOP: 1450 ml x 24 hours  NUTRITION - FOCUSED PHYSICAL EXAM:    Most Recent Value  Orbital Region  No depletion  Upper Arm Region  Mild depletion  Thoracic and Lumbar Region  No depletion  Buccal Region  No depletion  Temple Region  No depletion  Clavicle Bone Region  No depletion  Clavicle and Acromion Bone Region  No depletion  Scapular Bone Region  No depletion  Dorsal Hand  No depletion  Patellar Region  No depletion  Anterior Thigh Region  No depletion  Posterior Calf Region  No depletion  Edema (RD Assessment)  Moderate [abdomen]  Hair  Reviewed  Eyes  Reviewed  Mouth  Reviewed  Skin  Reviewed  Nails  Reviewed       Diet Order:   Diet Order           Diet clear liquid Room service appropriate? Yes; Fluid consistency: Thin  Diet effective now          EDUCATION NEEDS:   Education needs have been addressed  Skin:  Skin Assessment: Skin Integrity Issues: Skin Integrity Issues:: Incisions Incisions: closed incision to abdomen  Last BM:  10/04/17  Height:   Ht Readings from Last 1 Encounters:  10/06/17 6\' 1"  (1.854 m)    Weight:   Wt Readings from Last 1 Encounters:  10/06/17 296 lb (134.3 kg)    Ideal Body Weight:  83.6 kg  BMI:  Body mass index is 39.05 kg/m.  Estimated Nutritional Needs:   Kcal:  2400-2600 kcal/day  Protein:  125-140 grams/day  Fluid:  per MD    Gaynell Face, MS, RD, LDN Pager: [phone] Weekend/After Hours: [phone]

## 2017-10-11 NOTE — Addendum Note (Signed)
Addendum  created 10/11/17 1152 by Lynda Rainwater, MD   Intraprocedure Staff edited

## 2017-10-12 NOTE — Discharge Summary (Addendum)
Physician Discharge Summary  Patient ID: Terry Parks MRN: [id number] DOB/AGE: August 16, 1955 62 y.o.  Admit date: 10/06/2017 Discharge date: 10/07/2017  Admission Diagnoses:  Discharge Diagnoses:  Active Problems:   Prostate cancer [facility])   Discharged Condition: good  Hospital Course: 62 year old male underwent RALP on 10/06/2017.  He tolerated the procedure well and the following day his JP drain creatinine was consistent with serum.  He was ambulating with pain well-controlled and tolerating diet.  He was discharged home in stable condition  Consults: None  Significant Diagnostic Studies: None  Treatments: surgery: RALP  Discharge Exam: Blood pressure 140/90, pulse 82, temperature 97.8 F (36.6 C), temperature source Oral, resp. rate 20, height 6\' 1"  (1.854 m), weight 134.3 kg (296 lb), SpO2 100 %. General appearance: alert no acute distress Adequate peripheral perfusion of extremities Non-labored respirations Abdomen soft, appropriately tender, incisions clean dry and intact Foley catheter draining clear yellow urine JP serosanguineous  Disposition:    Allergies as of 10/07/2017      Reactions   Lisinopril Cough      Medication List    TAKE these medications   amiodarone 200 MG tablet Commonly known as:  PACERONE Take 1 tablet (200 mg total) by mouth daily.   amLODipine 5 MG tablet Commonly known as:  NORVASC Take 1 tablet (5 mg total) by mouth daily.   atorvastatin 40 MG tablet Commonly known as:  LIPITOR Take 1 tablet (40 mg total) by mouth daily.   insulin glargine 100 UNIT/ML injection Commonly known as:  LANTUS Inject 60 Units into the skin daily.   BASAGLAR KWIKPEN 100 UNIT/ML Sopn Inject 0.6 mLs (60 Units total) into the skin daily.   carvedilol 12.5 MG tablet Commonly known as:  COREG Take 1 tablet (12.5 mg total) by mouth 2 (two) times daily with a meal.   DULoxetine 60 MG capsule Commonly known as:  CYMBALTA Take 1 capsule (60 mg total)  by mouth daily.   Empagliflozin-metFORMIN HCl ER 09-998 MG Tb24 Commonly known as:  SYNJARDY XR Take 2 tablets by mouth daily. What changed:    how much to take  when to take this   furosemide 40 MG tablet Commonly known as:  LASIX Take 1 tablet (40 mg total) by mouth 2 (two) times daily.   gabapentin 300 MG capsule Commonly known as:  NEURONTIN Take 1 capsule (300 mg total) by mouth daily.   glucose blood test strip Commonly known as:  TRUETRACK TEST Use as directed to test blood sugar three times a day before meals.   HYDROcodone-acetaminophen 5-325 MG tablet Commonly known as:  NORCO Take 1 tablet by mouth every 4 (four) hours as needed for moderate pain.   Insulin Pen Needle 31G X 6 MM Misc Commonly known as:  CARETOUCH PEN NEEDLES 1 pen by Does not apply route at bedtime.   isosorbide mononitrate 30 MG 24 hr tablet Commonly known as:  IMDUR Take 1 tablet (30 mg total) by mouth daily.   Lancets Misc Use to test blood sugar three times a day before meals.   losartan 50 MG tablet Commonly known as:  COZAAR Take 1 tablet (50 mg total) by mouth daily.   Misc. Devices Misc by Does not apply route. C-PAP   oxyCODONE 5 MG immediate release tablet Commonly known as:  Oxy IR/ROXICODONE Take 1 tablet (5 mg total) by mouth every 4 (four) hours as needed for moderate pain.   tamsulosin 0.4 MG Caps capsule Commonly known as:  FLOMAX Take  0.4 mg by mouth daily.        Signed: Marton Redwood, III 10/12/2017, 10:39 AM

## 2017-10-14 ENCOUNTER — Other Ambulatory Visit: Payer: Self-pay | Admitting: Pharmacist

## 2017-10-14 ENCOUNTER — Encounter: Payer: Self-pay | Admitting: Pharmacist

## 2017-10-14 DIAGNOSIS — I5042 Chronic combined systolic (congestive) and diastolic (congestive) heart failure: Secondary | ICD-10-CM

## 2017-10-14 DIAGNOSIS — E1142 Type 2 diabetes mellitus with diabetic polyneuropathy: Secondary | ICD-10-CM

## 2017-10-14 DIAGNOSIS — I4819 Other persistent atrial fibrillation: Secondary | ICD-10-CM

## 2017-10-14 DIAGNOSIS — I10 Essential (primary) hypertension: Secondary | ICD-10-CM

## 2017-10-14 DIAGNOSIS — R079 Chest pain, unspecified: Secondary | ICD-10-CM

## 2017-10-14 MED ORDER — AMLODIPINE-ATORVASTATIN 5-40 MG PO TABS: 1.0000 | ORAL_TABLET | Freq: Every day | ORAL | 3 refills | Status: DC

## 2017-10-14 MED ORDER — LOSARTAN POTASSIUM 50 MG PO TABS: 50.0000 mg | ORAL_TABLET | Freq: Every day | ORAL | 3 refills | Status: DC

## 2017-10-14 MED ORDER — FUROSEMIDE 40 MG PO TABS: 40.0000 mg | ORAL_TABLET | Freq: Two times a day (BID) | ORAL | 0 refills | Status: DC

## 2017-10-14 MED ORDER — BASAGLAR KWIKPEN 100 UNIT/ML ~~LOC~~ SOPN: 60.0000 [IU] | PEN_INJECTOR | Freq: Every day | SUBCUTANEOUS | 3 refills | Status: DC

## 2017-10-14 MED ORDER — DULOXETINE HCL 60 MG PO CPEP: 60.0000 mg | ORAL_CAPSULE | Freq: Every day | ORAL | 3 refills | Status: DC

## 2017-10-14 MED ORDER — AMIODARONE HCL 200 MG PO TABS: 200.0000 mg | ORAL_TABLET | Freq: Every day | ORAL | 0 refills | Status: DC

## 2017-10-14 MED ORDER — CARVEDILOL 12.5 MG PO TABS: 12.5000 mg | ORAL_TABLET | Freq: Two times a day (BID) | ORAL | 3 refills | Status: DC

## 2017-10-14 MED ORDER — GABAPENTIN 300 MG PO CAPS: 300.0000 mg | ORAL_CAPSULE | Freq: Every day | ORAL | 0 refills | Status: DC

## 2017-10-14 NOTE — Progress Notes (Signed)
Patient requested transfers to [facility] pharmacy for help with access. Patient is actively enrolled in the program. Prescriptions sent. Patient was advised to contact clinic if further concerns arise. He verbalized understanding.

## 2017-10-14 NOTE — Progress Notes (Signed)
Transferred prescriptions to [facility] pharmacy

## 2017-10-17 ENCOUNTER — Ambulatory Visit: Payer: Self-pay

## 2017-10-28 ENCOUNTER — Ambulatory Visit (INDEPENDENT_AMBULATORY_CARE_PROVIDER_SITE_OTHER): Payer: No Typology Code available for payment source | Admitting: Internal Medicine

## 2017-10-28 ENCOUNTER — Encounter: Payer: Self-pay | Admitting: Internal Medicine

## 2017-10-28 ENCOUNTER — Ambulatory Visit: Payer: No Typology Code available for payment source | Admitting: Pharmacist

## 2017-10-28 ENCOUNTER — Ambulatory Visit: Payer: No Typology Code available for payment source | Admitting: Dietician

## 2017-10-28 ENCOUNTER — Other Ambulatory Visit: Payer: Self-pay

## 2017-10-28 VITALS — BP 166/89 | HR 82 | Temp 97.6°F | Wt 289.1 lb

## 2017-10-28 DIAGNOSIS — Z79899 Other long term (current) drug therapy: Secondary | ICD-10-CM

## 2017-10-28 DIAGNOSIS — Z9079 Acquired absence of other genital organ(s): Secondary | ICD-10-CM

## 2017-10-28 DIAGNOSIS — E1142 Type 2 diabetes mellitus with diabetic polyneuropathy: Secondary | ICD-10-CM

## 2017-10-28 DIAGNOSIS — I5043 Acute on chronic combined systolic (congestive) and diastolic (congestive) heart failure: Secondary | ICD-10-CM

## 2017-10-28 DIAGNOSIS — K0889 Other specified disorders of teeth and supporting structures: Secondary | ICD-10-CM

## 2017-10-28 DIAGNOSIS — I4819 Other persistent atrial fibrillation: Secondary | ICD-10-CM

## 2017-10-28 DIAGNOSIS — I110 Hypertensive heart disease with heart failure: Secondary | ICD-10-CM

## 2017-10-28 DIAGNOSIS — Z794 Long term (current) use of insulin: Secondary | ICD-10-CM

## 2017-10-28 DIAGNOSIS — Z7901 Long term (current) use of anticoagulants: Secondary | ICD-10-CM

## 2017-10-28 DIAGNOSIS — I481 Persistent atrial fibrillation: Secondary | ICD-10-CM

## 2017-10-28 DIAGNOSIS — C61 Malignant neoplasm of prostate: Secondary | ICD-10-CM

## 2017-10-28 LAB — HM DIABETES EYE EXAM

## 2017-10-28 NOTE — Patient Instructions (Signed)
It was a pleasure to see you today Mr. Man. Please make the following changes:  -Please use tylenol for your tooth pain  -Please re-start xarelto that was stopped prior to your operation -Follow up with me in August  If you have any questions or concerns, please call our clinic at [phone] between 9am-5pm and after hours call [phone] and ask for the internal medicine resident on call. If you feel you are having a medical emergency please call 911.   Thank you, we look forward to help you remain healthy!  [name], MD Internal Medicine PGY1

## 2017-10-28 NOTE — Progress Notes (Signed)
CC: Tooth pain and PSA lab check  HPI:  [name] is a 62 y.o.  With type 2 diabetes mellitus, essential hypertension, acute on chronic combined diastolic and systolic heart failure, and prostate cancer who presented for blood pressure follow up. Please see problem based charting for evaluation, assessment, and plan.   Past Medical History:  Diagnosis Date  . Abscessed tooth    top back large cavity no pain or drainage, one on bottom  pt pulled tooth 4-5 months ago, right top large hole in tooth  . Allergic rhinitis   . BPH (benign prostatic hypertrophy)    Massive BPH noted on cystoscopy 1/23/ 2012 by Dr. Risa Grill.  . Cardiomyopathy ([place])   . CHF (congestive heart failure) ([place])   . Depression   . Diabetes mellitus 04/08/2008   type 2  . Foley catheter in place 07-05-17 placed  . Headache(784.0)    hx migraines none recent  . Hyperlipemia   . Hypertension   . Hypertensive cardiopathy 03/01/2006   2-D echocardiogram 02/01/2012 showed moderate LVH, mildly to moderately reduced left ventricular systolic function with an estimated ejection fraction of 40-45%, and diffuse hypokinesis.  A nuclear medicine stress study done 01/31/2012 showed no reversible ischemia, a small mid anterior wall fixed defect/infarct, and ejection fraction 42%.      . Neck pain   . Obstructive sleep apnea 03/06/2008   Sleep study 03/06/08 showed severe OSA/hypopnea syndrome, with successful CPAP titration to 13 CWP using a medium ResMed Mirage Quattro full face mask with heated humidifier.   . Renal calculus 05/29/2010   CT scan of abdomen/pelvis on 05/29/2010 showed an obstructing approximate 1-2 mm calculus at the left UVJ, and an approximate 1-2 mm left lower pole renal calculus.   Patient had continuing severe pain , and an elevation of his serum creatinine to a value of 1.75 on 06/06/2010.  The stone had apparently passed and was not seen on repeat CT 06/08/2010.   Review of Systems:    Right sided  tooth pain  Denies any chest pain, palpitations  Physical Exam:  Vitals:   10/28/17 1610  BP: (!) 166/89  Pulse: 82  Temp: 97.6 F (36.4 C)  TempSrc: Oral  SpO2: 100%  Weight: 289 lb 1.6 oz (131.1 kg)   Physical Exam  Constitutional: He appears well-developed and well-nourished. No distress.  HENT:  Head: Normocephalic and atraumatic.  Mouth/Throat: Oropharynx is clear and moist. There is trismus in the jaw. Abnormal dentition. Dental caries present. No dental abscesses, uvula swelling or lacerations. No oropharyngeal exudate, posterior oropharyngeal edema, posterior oropharyngeal erythema or tonsillar abscesses.  Eyes: Conjunctivae are normal.  Cardiovascular: Normal rate, regular rhythm and normal heart sounds.  Respiratory: Effort normal and breath sounds normal. No respiratory distress. He has no wheezes.  GI: Soft. Bowel sounds are normal. He exhibits no distension. There is no tenderness.  Musculoskeletal: He exhibits no edema.  Neurological: He is alert.  Skin: He is not diaphoretic. No erythema.  Psychiatric: He has a normal mood and affect. His behavior is normal. Judgment and thought content normal.        Assessment & Plan:   See Encounters Tab for problem based charting.   Diabetes Mellitus The patient is taking lantus 60u qd, empagliflozin-metformin 5-1000mg  bid. The patient's last a1c=7.2 in May 2019. The patient has been compliant with his medication.   Assessment and plan Did not re-evaluate a1c or glucose during this visit as he was assessed recently. Will recheck  in August continuity visit.    Atrial fibrillation  The patient is taking amiodarone 200mg  qd, carvedilol 12.5mg  bid  Assessment and plan The patient was normal rate and rhythm on auscultation. Advised to continue current amiodarone and carvedilol dose. Noticed that the patient stopped xarelto prior to his prostate removal 4 weeks ago and did not restart it. Recommended that the patient  restart xarelto.  Prostate Cancer  The patient had a prostatectomy done 10/06/17 and is being followed by Dr. Gloriann Loan (urology). Repeat psa check was requested by urology office.   -ordered psa  Tooth pain  The patient has been having right sided molar pain for the past several weeks that has progressively gotten worse. He states that he is having to chew on his left side as his right side is hurting. The patient states that he has been using some over the counter numbing spray to ease the pain, which helps temporarily. The patient has not used any oral pain medication. He has been referred to dental clinic at previous visit, but unfortunately the dental clinic is under construction and the patient has not been able to get an appointment.   Assessment and plan The patient's right tooth needs to be examined by dentist. Gave patient contact information for a different low fee dental clinic.   The patient's tooth and surrounding area is free of erythema or exudate. The patient is also afebrile and does not have lymphadenopathy to suggest infection.   The patient was recommended to take tylenol over the counter for pain relief.   Patient discussed with Dr. Rebeca Alert

## 2017-10-28 NOTE — Progress Notes (Signed)
S: Terry Parks is a 62 y.o. male reports to clinical pharmacist appointment for medication help.   Allergies  Allergen Reactions  . Lisinopril Cough   Medication Sig  amiodarone (PACERONE) 200 MG tablet Take 1 tablet (200 mg total) by mouth daily.  amLODipine-atorvastatin (CADUET) 5-40 MG tablet Take 1 tablet by mouth daily.  carvedilol (COREG) 12.5 MG tablet Take 1 tablet (12.5 mg total) by mouth 2 (two) times daily with a meal.  DULoxetine (CYMBALTA) 60 MG capsule Take 1 capsule (60 mg total) by mouth daily.  Empagliflozin-metFORMIN HCl ER (SYNJARDY XR) 09-998 MG TB24 Take 2 tablets by mouth daily. Patient taking differently: Take 1 tablet by mouth 2 (two) times daily.   furosemide (LASIX) 40 MG tablet Take 1 tablet (40 mg total) by mouth 2 (two) times daily.  gabapentin (NEURONTIN) 300 MG capsule Take 1 capsule (300 mg total) by mouth daily.  glucose blood (TRUETRACK TEST) test strip Use as directed to test blood sugar three times a day before meals.  HYDROcodone-acetaminophen (NORCO) 5-325 MG tablet Take 1 tablet by mouth every 4 (four) hours as needed for moderate pain.  Insulin Glargine (BASAGLAR KWIKPEN) 100 UNIT/ML SOPN Inject 0.6 mLs (60 Units total) into the skin daily.  Insulin Pen Needle (CARETOUCH PEN NEEDLES) 31G X 6 MM MISC 1 pen by Does not apply route at bedtime.  isosorbide mononitrate (IMDUR) 30 MG 24 hr tablet Take 1 tablet (30 mg total) by mouth daily. Patient taking differently: Take 30 mg by mouth daily.   Lancets MISC Use to test blood sugar three times a day before meals.  losartan (COZAAR) 50 MG tablet Take 1 tablet (50 mg total) by mouth daily.  Misc. Devices MISC by Does not apply route. C-PAP  oxyCODONE (OXY IR/ROXICODONE) 5 MG immediate release tablet Take 1 tablet (5 mg total) by mouth every 4 (four) hours as needed for moderate pain.  tamsulosin (FLOMAX) 0.4 MG CAPS capsule Take 0.4 mg by mouth daily.   Past Medical History:  Diagnosis Date  .  Abscessed tooth    top back large cavity no pain or drainage, one on bottom  pt pulled tooth 4-5 months ago, right top large hole in tooth  . Allergic rhinitis   . BPH (benign prostatic hypertrophy)    Massive BPH noted on cystoscopy 1/23/ 2012 by Dr. Risa Grill.  . Cardiomyopathy ([place])   . CHF (congestive heart failure) ([place])   . Depression   . Diabetes mellitus 04/08/2008   type 2  . Foley catheter in place 07-05-17 placed  . Headache(784.0)    hx migraines none recent  . Hyperlipemia   . Hypertension   . Hypertensive cardiopathy 03/01/2006   2-D echocardiogram 02/01/2012 showed moderate LVH, mildly to moderately reduced left ventricular systolic function with an estimated ejection fraction of 40-45%, and diffuse hypokinesis.  A nuclear medicine stress study done 01/31/2012 showed no reversible ischemia, a small mid anterior wall fixed defect/infarct, and ejection fraction 42%.      . Neck pain   . Obstructive sleep apnea 03/06/2008   Sleep study 03/06/08 showed severe OSA/hypopnea syndrome, with successful CPAP titration to 13 CWP using a medium ResMed Mirage Quattro full face mask with heated humidifier.   . Renal calculus 05/29/2010   CT scan of abdomen/pelvis on 05/29/2010 showed an obstructing approximate 1-2 mm calculus at the left UVJ, and an approximate 1-2 mm left lower pole renal calculus.   Patient had continuing severe pain , and an elevation of his  serum creatinine to a value of 1.75 on 06/06/2010.  The stone had apparently passed and was not seen on repeat CT 06/08/2010.   Social History   Socioeconomic History  . Marital status: Single    Spouse name: Not on file  . Number of children: 4 b  . Years of education: 46  . Highest education level: Not on file  Occupational History  . Occupation:        Employer: UNEMPLOYED  Social Needs  . Financial resource strain: Not on file  . Food insecurity:    Worry: Not on file    Inability: Not on file  . Transportation needs:     Medical: Not on file    Non-medical: Not on file  Tobacco Use  . Smoking status: Never Smoker  . Smokeless tobacco: Never Used  Substance and Sexual Activity  . Alcohol use: No    Alcohol/week: 0.0 oz  . Drug use: No  . Sexual activity: Not Currently  Lifestyle  . Physical activity:    Days per week: Not on file    Minutes per session: Not on file  . Stress: Not on file  Relationships  . Social connections:    Talks on phone: Not on file    Gets together: Not on file    Attends religious service: Not on file    Active member of club or organization: Not on file    Attends meetings of clubs or organizations: Not on file    Relationship status: Not on file  Other Topics Concern  . Not on file  Social History Narrative   Divorced, 4 children, lives alone.     Family History  Problem Relation Age of Onset  . Breast cancer Mother   . Hypertension Father   . Diabetes Maternal Grandmother   . Colon cancer Neg Hx   . Prostate cancer Neg Hx   . Heart attack Neg Hx    O:    Component Value Date/Time   CHOL 130 08/22/2017 1151   HDL 39 (L) 08/22/2017 1151   TRIG 79 08/22/2017 1151   AST 18 08/22/2017 1151   ALT 15 08/22/2017 1151   NA 136 10/07/2017 0340   NA 140 08/22/2017 1151   K 4.3 10/07/2017 0340   CL 103 10/07/2017 0340   CO2 23 10/07/2017 0340   GLUCOSE 326 (H) 10/07/2017 0340   HGBA1C 7.2 09/16/2017 1451   HGBA1C 8.1 (H) 06/06/2017 2208   BUN 16 10/07/2017 0340   BUN 6 08/22/2017 1151   CREATININE 1.28 (H) 10/07/2017 0340   CREATININE 1.08 07/18/2014 1056   CALCIUM 8.0 (L) 10/07/2017 0340   GFRNONAA 60 (L) 10/07/2017 0340   GFRNONAA 76 07/18/2014 1056   GFRAA >60 10/07/2017 0340   GFRAA 88 07/18/2014 1056   WBC 6.5 09/26/2017 1149   HGB 12.0 (L) 10/07/2017 0340   HCT 38.4 (L) 10/07/2017 0340   PLT 162 09/26/2017 1149   TSH 0.236 (L) 06/06/2017 2208   TSH 0.289 (L) 01/31/2012 1056   Ht Readings from Last 2 Encounters:  10/06/17 6\' 1"  (1.854 m)   09/26/17 6\' 1"  (1.854 m)   Wt Readings from Last 2 Encounters:  10/06/17 296 lb (134.3 kg)  09/26/17 296 lb 6 oz (134.4 kg)   There is no height or weight on file to calculate BMI. BP Readings from Last 3 Encounters:  10/07/17 140/90  09/26/17 (!) 168/98  09/16/17 134/75    A/P: Medications were reviewed  with the patient. He correctly reports how to take each medication and is able to access them through patient assistance program and [employer]. Patient needs clarification on whether to continue rivaroxaban and tamsulosin. Will notify PCP. He requests an appointment with me during other clinic appointments to continue to help with medications. Advised patient to contact me if further help needed with medications.   Patient verbalized understanding by repeating back information and was advised to contact me if further medication-related questions arise. Patient was also provided an information handout.

## 2017-10-28 NOTE — Progress Notes (Signed)
Retinal images done and transmitted.  

## 2017-10-29 DIAGNOSIS — K0889 Other specified disorders of teeth and supporting structures: Secondary | ICD-10-CM | POA: Insufficient documentation

## 2017-10-29 HISTORY — DX: Other specified disorders of teeth and supporting structures: K08.89

## 2017-10-29 LAB — PSA: Prostate Specific Ag, Serum: 0.1 ng/mL (ref 0.0–4.0)

## 2017-10-29 NOTE — Assessment & Plan Note (Signed)
The patient has been having right sided molar pain for the past several weeks that has progressively gotten worse. He states that he is having to chew on his left side as his right side is hurting. The patient states that he has been using some over the counter numbing spray to ease the pain, which helps temporarily. The patient has not used any oral pain medication. He has been referred to dental clinic at previous visit, but unfortunately the dental clinic is under construction and the patient has not been able to get an appointment.   Assessment and plan The patient's right tooth needs to be examined by dentist. Gave patient contact information for a different low fee dental clinic.   The patient's tooth and surrounding area is free of erythema or exudate. The patient is also afebrile and does not have lymphadenopathy to suggest infection.   The patient was recommended to take tylenol over the counter for pain relief.

## 2017-10-29 NOTE — Assessment & Plan Note (Signed)
The patient had a prostatectomy done 10/06/17 and is being followed by Dr. Gloriann Loan (urology). Repeat psa check was requested by urology office.   -ordered psa

## 2017-10-29 NOTE — Assessment & Plan Note (Signed)
The patient is taking amiodarone 200mg  qd, carvedilol 12.5mg  bid  Assessment and plan The patient was normal rate and rhythm on auscultation. Advised to continue current amiodarone and carvedilol dose. Noticed that the patient stopped xarelto prior to his prostate removal 4 weeks ago and did not restart it. Recommended that the patient restart xarelto.

## 2017-10-29 NOTE — Assessment & Plan Note (Signed)
The patient is taking lantus 60u qd, empagliflozin-metformin 5-1000mg  bid. The patient's last a1c=7.2 in May 2019. The patient has been compliant with his medication.   Assessment and plan Did not re-evaluate a1c or glucose during this visit as he was assessed recently. Will recheck in August continuity visit.

## 2017-10-31 NOTE — Addendum Note (Signed)
Addended by: Forde Dandy on: 10/31/2017 09:54 AM   Modules accepted: Orders

## 2017-11-02 ENCOUNTER — Ambulatory Visit: Payer: No Typology Code available for payment source

## 2017-11-02 NOTE — Progress Notes (Signed)
Internal Medicine Clinic Attending  Case discussed with Dr. Maricela Bo  at the time of the visit.  We reviewed the resident's history and exam and pertinent patient test results.  I agree with the assessment, diagnosis, and plan of care documented in the resident's note.  Oda Kilts, MD

## 2017-11-08 ENCOUNTER — Emergency Department (HOSPITAL_COMMUNITY)
Admission: EM | Admit: 2017-11-08 | Discharge: 2017-11-08 | Disposition: A | Discharge status: Home / Self Care | Payer: Medicaid Other | Attending: Emergency Medicine | Admitting: Emergency Medicine

## 2017-11-08 ENCOUNTER — Emergency Department (HOSPITAL_COMMUNITY): Payer: Medicaid Other

## 2017-11-08 ENCOUNTER — Encounter (HOSPITAL_COMMUNITY): Payer: Self-pay | Admitting: Emergency Medicine

## 2017-11-08 DIAGNOSIS — E049 Nontoxic goiter, unspecified: Secondary | ICD-10-CM

## 2017-11-08 DIAGNOSIS — K047 Periapical abscess without sinus: Secondary | ICD-10-CM | POA: Diagnosis not present

## 2017-11-08 DIAGNOSIS — Z794 Long term (current) use of insulin: Secondary | ICD-10-CM | POA: Diagnosis not present

## 2017-11-08 DIAGNOSIS — Z7901 Long term (current) use of anticoagulants: Secondary | ICD-10-CM | POA: Insufficient documentation

## 2017-11-08 DIAGNOSIS — E785 Hyperlipidemia, unspecified: Secondary | ICD-10-CM | POA: Diagnosis not present

## 2017-11-08 DIAGNOSIS — I5042 Chronic combined systolic (congestive) and diastolic (congestive) heart failure: Secondary | ICD-10-CM | POA: Diagnosis not present

## 2017-11-08 DIAGNOSIS — E1140 Type 2 diabetes mellitus with diabetic neuropathy, unspecified: Secondary | ICD-10-CM | POA: Diagnosis not present

## 2017-11-08 DIAGNOSIS — I110 Hypertensive heart disease with heart failure: Secondary | ICD-10-CM | POA: Diagnosis not present

## 2017-11-08 DIAGNOSIS — E0789 Other specified disorders of thyroid: Secondary | ICD-10-CM | POA: Diagnosis not present

## 2017-11-08 DIAGNOSIS — I481 Persistent atrial fibrillation: Secondary | ICD-10-CM | POA: Diagnosis not present

## 2017-11-08 DIAGNOSIS — Z79899 Other long term (current) drug therapy: Secondary | ICD-10-CM | POA: Insufficient documentation

## 2017-11-08 DIAGNOSIS — I10 Essential (primary) hypertension: Secondary | ICD-10-CM

## 2017-11-08 DIAGNOSIS — K0889 Other specified disorders of teeth and supporting structures: Secondary | ICD-10-CM | POA: Diagnosis present

## 2017-11-08 LAB — CBC WITH DIFFERENTIAL/PLATELET
ABS IMMATURE GRANULOCYTES: 0 10*3/uL (ref 0.0–0.1)
BASOS ABS: 0 10*3/uL (ref 0.0–0.1)
Basophils Relative: 0 %
EOS PCT: 16 %
Eosinophils Absolute: 1.1 10*3/uL — ABNORMAL HIGH (ref 0.0–0.7)
HCT: 42.1 % (ref 39.0–52.0)
Hemoglobin: 13.6 g/dL (ref 13.0–17.0)
Immature Granulocytes: 0 %
LYMPHS PCT: 27 %
Lymphs Abs: 1.9 10*3/uL (ref 0.7–4.0)
MCH: 25.7 pg — AB (ref 26.0–34.0)
MCHC: 32.3 g/dL (ref 30.0–36.0)
MCV: 79.6 fL (ref 78.0–100.0)
MONO ABS: 0.6 10*3/uL (ref 0.1–1.0)
Monocytes Relative: 8 %
NEUTROS ABS: 3.4 10*3/uL (ref 1.7–7.7)
Neutrophils Relative %: 49 %
Platelets: 175 10*3/uL (ref 150–400)
RBC: 5.29 MIL/uL (ref 4.22–5.81)
RDW: 16 % — ABNORMAL HIGH (ref 11.5–15.5)
WBC: 7 10*3/uL (ref 4.0–10.5)

## 2017-11-08 LAB — BASIC METABOLIC PANEL
Anion gap: 8 (ref 5–15)
BUN: 8 mg/dL (ref 6–20)
CO2: 28 mmol/L (ref 22–32)
CREATININE: 1.19 mg/dL (ref 0.61–1.24)
Calcium: 9.1 mg/dL (ref 8.9–10.3)
Chloride: 105 mmol/L (ref 98–111)
GFR calc Af Amer: >60 mL/min (ref >60)
Glucose, Bld: 162 mg/dL — ABNORMAL HIGH (ref 70–99)
Potassium: 4.7 mmol/L (ref 3.5–5.1)
SODIUM: 141 mmol/L (ref 135–145)

## 2017-11-08 LAB — CBG MONITORING, ED: GLUCOSE-CAPILLARY: 146 mg/dL — AB (ref 70–99)

## 2017-11-08 MED ORDER — CLINDAMYCIN HCL 300 MG PO CAPS: 300.0000 mg | ORAL_CAPSULE | Freq: Three times a day (TID) | ORAL | 0 refills | Status: DC

## 2017-11-08 MED ORDER — ACETAMINOPHEN 500 MG PO TABS
1000.0000 mg | ORAL_TABLET | Freq: Once | ORAL | Status: AC
Start: 2017-11-08 — End: 2017-11-08
  Administered 2017-11-08: 1000 mg via ORAL
  Filled 2017-11-08: qty 2

## 2017-11-08 MED ORDER — LIDOCAINE VISCOUS HCL 2 % MT SOLN: 15.0000 mL | OROMUCOSAL | 0 refills | Status: DC | PRN

## 2017-11-08 MED ORDER — CHLORHEXIDINE GLUCONATE 0.12% ORAL RINSE (MEDLINE KIT): 15.0000 mL | Freq: Two times a day (BID) | OROMUCOSAL | 0 refills | Status: DC

## 2017-11-08 MED ORDER — IOHEXOL 300 MG/ML  SOLN
100.0000 mL | Freq: Once | INTRAMUSCULAR | Status: AC | PRN
  Administered 2017-11-08: 100 mL via INTRAVENOUS

## 2017-11-08 MED ORDER — SODIUM CHLORIDE 0.9 % IV BOLUS
500.0000 mL | Freq: Once | INTRAVENOUS | Status: AC
  Administered 2017-11-08: 500 mL via INTRAVENOUS

## 2017-11-08 MED ORDER — CLINDAMYCIN PHOSPHATE 600 MG/50ML IV SOLN
600.0000 mg | Freq: Once | INTRAVENOUS | Status: AC
  Administered 2017-11-08: 600 mg via INTRAVENOUS
  Filled 2017-11-08: qty 50

## 2017-11-08 NOTE — ED Notes (Signed)
Pt CBG 146 

## 2017-11-08 NOTE — Discharge Instructions (Addendum)
Please see the information and instructions below regarding your visit.  Your diagnoses today include:  1. Dental infection   2. Elevated blood pressure reading in office with diagnosis of hypertension   3. Enlarged thyroid    You have a dental infection. It is very important that you get evaluated by a dentist as soon as possible. Call tomorrow to schedule an appointment. Tylenol as needed for pain. Take your full course of antibiotics. Read the instructions below.   Please also follow-up with your primary care provider regarding the enlarged thyroid finding on your CT scan.  The reading states that it is stable from prior reading.  Tests performed today include: See side panel of your discharge paperwork for testing performed today. Vital signs are listed at the bottom of these instructions.   Medications prescribed:    Take any prescribed medications only as prescribed, and any over the counter medications only as directed on the packaging.  1. You are prescribed Clindamycin, an antibiotic. Please take all of your antibiotics until finished.   You may develop abdominal discomfort or nausea from the antibiotic. If this occurs, you may take it with food. Some patients also get diarrhea with antibiotics. You may help offset this with probiotics which you can buy or get in yogurt. Do not eat or take the probiotics until 2 hours after your antibiotic. Some women develop vaginal yeast infections after antibiotics. If you develop unusual vaginal discharge after being on this medication, please see your primary care provider.   Some people develop allergies to antibiotics. Symptoms of antibiotic allergy can be mild and include a flat rash and itching. They can also be more serious and include:  ?Hives - Hives are raised, red patches of skin that are usually very itchy.  ?Lip or tongue swelling  ?Trouble swallowing or breathing  ?Blistering of the skin or mouth.  If you have any of these  serious symptoms, please seek emergency medical care immediately.  2.  You may take Tylenol, 650 mg every 6 hours as needed for pain.  3.  Please use the chlorhexidine rinse swish and spit twice a day.  4.  Please use viscous lidocaine swish and spit 4 times a day as needed for discomfort in the mouth.  Home care instructions:  Please follow any educational materials contained in this packet.   Eat a soft or liquid diet and rinse your mouth out after meals with warm water. You should see a dentist or return here at once if you have increased swelling, increased pain or uncontrolled bleeding from the site of your injury.  Follow-up instructions: It is very important that you see a dentist as soon as possible. There is a list of dentists attached to this packet if you do not have care established with a dentist already. Please give a call to a dentist of your choice tomorrow.  Return instructions:  Please return to the Emergency Department if you experience worsening symptoms.  Please seek care if you note any of the following about your dental pain:  You have increased pain not controlled with medicines.  You have swelling around your tooth, in your face or neck.  You have bleeding which starts, continues, or gets worse.  You have a fever >101 If you are unable to open your mouth Please return if you have any other emergent concerns.  Additional Information:   Your vital signs today were: BP (!) 178/99    Pulse 90  Temp 97.7 F (36.5 C) (Oral)    Resp 18    SpO2 92%  If your blood pressure (BP) was elevated on multiple readings during this visit above 130 for the top number or above 80 for the bottom number, please have this repeated by your primary care provider within one month. --------------  Thank you for allowing [place] to participate in your care today.

## 2017-11-08 NOTE — ED Notes (Signed)
Patient transported to CT 

## 2017-11-08 NOTE — ED Notes (Signed)
Pt IV taken out  

## 2017-11-08 NOTE — ED Triage Notes (Signed)
Pt reports right lower dental pain with swelling in his jaw/neck x1 week, saw his doctor who referred him here for iv abx. Pt denies fevers at home.

## 2017-11-08 NOTE — ED Provider Notes (Signed)
[facility] EMERGENCY DEPARTMENT Provider Note   CSN: [id number] Arrival date & time: 11/08/17  0827     History   Chief Complaint Chief Complaint  Patient presents with  . Dental Pain    HPI Terry Parks is a 62 y.o. male.  HPI  Patient is a 62 year old male with a history of prostate cancer, surgically removed in May 2019, type 2 diabetes mellitus on insulin CHF, hypertension, hyperlipidemia, and depression presenting for tooth pain and facial swelling.  Patient reports that he has had difficulty with a lower molar on the right for approximately 2 to 3 weeks, however over the last couple days he has had worsening pain and swelling of the lateral portion of his right jaw.  Patient denies any fevers, chills, difficulty breathing, difficulty swallowing, obstructive swallowing, changes in phonation.  Patient does report that the lateral portion of his right neck is tender.  Patient reports he is try to get into a dental clinic, however the when he typically goes to is under Architect.  Past Medical History:  Diagnosis Date  . Abscessed tooth    top back large cavity no pain or drainage, one on bottom  pt pulled tooth 4-5 months ago, right top large hole in tooth  . Allergic rhinitis   . BPH (benign prostatic hypertrophy)    Massive BPH noted on cystoscopy 1/23/ 2012 by Dr. Risa Grill.  . Cardiomyopathy ([place])   . CHF (congestive heart failure) ([place])   . Depression   . Diabetes mellitus 04/08/2008   type 2  . Foley catheter in place 07-05-17 placed  . Headache(784.0)    hx migraines none recent  . Hyperlipemia   . Hypertension   . Hypertensive cardiopathy 03/01/2006   2-D echocardiogram 02/01/2012 showed moderate LVH, mildly to moderately reduced left ventricular systolic function with an estimated ejection fraction of 40-45%, and diffuse hypokinesis.  A nuclear medicine stress study done 01/31/2012 showed no reversible ischemia, a small mid anterior wall fixed  defect/infarct, and ejection fraction 42%.      . Neck pain   . Obstructive sleep apnea 03/06/2008   Sleep study 03/06/08 showed severe OSA/hypopnea syndrome, with successful CPAP titration to 13 CWP using a medium ResMed Mirage Quattro full face mask with heated humidifier.   . Renal calculus 05/29/2010   CT scan of abdomen/pelvis on 05/29/2010 showed an obstructing approximate 1-2 mm calculus at the left UVJ, and an approximate 1-2 mm left lower pole renal calculus.   Patient had continuing severe pain , and an elevation of his serum creatinine to a value of 1.75 on 06/06/2010.  The stone had apparently passed and was not seen on repeat CT 06/08/2010.    Patient Active Problem List   Diagnosis Date Noted  . Tooth pain 10/29/2017  . Prostate cancer ([place]) 10/06/2017  . BPH (benign prostatic hyperplasia) 07/13/2017  . Bladder mass 07/13/2017  . AKI (acute kidney injury) ([place])   . Elevated troponin   . Persistent atrial fibrillation ([place])   . Acute renal failure (ARF) ([place]) 06/06/2017  . Acute on chronic combined systolic (congestive) and diastolic (congestive) heart failure ([place]) 06/06/2017  . Urinary straining 11/02/2016  . Encounter for HCV screening test for low risk patient 11/02/2016  . Chest pain 11/02/2016  . Vitamin D deficiency 06/22/2016  . Neuropathy in diabetes ([place]) 10/07/2015  . Erectile dysfunction 04/17/2014  . Rash 04/17/2014  . Allergic rhinitis 10/19/2012  . Diabetic retinopathy ([place]) 10/04/2012  . Cough 03/30/2012  .  BPH   . Nephrolithiasis 05/29/2010  . Type 2 diabetes mellitus with peripheral neuropathy ([place]) 04/08/2008  . Obstructive sleep apnea   . Morbid obesity ([place])   . Hyperlipidemia associated with type 2 diabetes mellitus ([place])   . Essential hypertension   . Chronic combined systolic and diastolic CHF (congestive heart failure) ([place]) 03/01/2006    Past Surgical History:  Procedure Laterality Date  . big toe nails removed Bilateral 20 yrs ago  .  CARDIOVERSION N/A 06/08/2017   Procedure: CARDIOVERSION;  Surgeon: Josue Hector, MD;  Location: [facility] ENDOSCOPY;  Service: Cardiovascular;  Laterality: N/A;  . CYSTOSCOPY W/ RETROGRADES    . LYMPHADENECTOMY Bilateral 10/06/2017   Procedure: LYMPHADENECTOMY;  Surgeon: Lucas Mallow, MD;  Location: [facility] ORS;  Service: Urology;  Laterality: Bilateral;  . ROBOT ASSISTED LAPAROSCOPIC RADICAL PROSTATECTOMY N/A 10/06/2017   Procedure: XI ROBOTIC ASSISTED LAPAROSCOPIC RADICAL PROSTATECTOMY;  Surgeon: Lucas Mallow, MD;  Location: [facility] ORS;  Service: Urology;  Laterality: N/A;  . TRANSURETHRAL RESECTION OF BLADDER TUMOR N/A 07/13/2017   Procedure: TRANSURETHRAL RESECTION OF PROSTATE;  Surgeon: Lucas Mallow, MD;  Location: [facility] ORS;  Service: Urology;  Laterality: N/A;        Home Medications    Prior to Admission medications   Medication Sig Start Date End Date Taking? Authorizing Provider  amiodarone (PACERONE) 200 MG tablet Take 1 tablet (200 mg total) by mouth daily. 10/14/17 11/13/17 Yes [name], Vahini, MD  amLODipine-atorvastatin (CADUET) 5-40 MG tablet Take 1 tablet by mouth daily. 10/14/17  Yes [name], Vahini, MD  atorvastatin (LIPITOR) 40 MG tablet Take 40 mg by mouth daily. 10/14/17  Yes [provider]  carvedilol (COREG) 12.5 MG tablet Take 1 tablet (12.5 mg total) by mouth 2 (two) times daily with a meal. 10/14/17 11/13/17 Yes [name], Vahini, MD  DULoxetine (CYMBALTA) 60 MG capsule Take 1 capsule (60 mg total) by mouth daily. 10/14/17 10/14/18 Yes [name], Vahini, MD  Empagliflozin-metFORMIN HCl ER (SYNJARDY XR) 09-998 MG TB24 Take 2 tablets by mouth daily. Patient taking differently: Take 1 tablet by mouth 2 (two) times daily.  08/25/17  Yes [name], Vahini, MD  furosemide (LASIX) 40 MG tablet Take 1 tablet (40 mg total) by mouth 2 (two) times daily. 10/14/17 11/13/17 Yes [name], Vahini, MD  gabapentin (NEURONTIN) 300 MG capsule Take 1 capsule (300 mg total) by mouth daily. 10/14/17  11/13/17 Yes [name], Vahini, MD  insulin glargine (LANTUS) 100 UNIT/ML injection Inject 60 Units into the skin at bedtime.   Yes [provider]  isosorbide mononitrate (IMDUR) 30 MG 24 hr tablet Take 1 tablet (30 mg total) by mouth daily. Patient taking differently: Take 30 mg by mouth daily.  07/06/17  Yes Forde Dandy, PharmD  losartan (COZAAR) 50 MG tablet Take 1 tablet (50 mg total) by mouth daily. 10/14/17  Yes [name], Vahini, MD  XARELTO 20 MG TABS tablet Take 20 mg by mouth daily. 10/14/17  Yes [provider]  glucose blood (TRUETRACK TEST) test strip Use as directed to test blood sugar three times a day before meals. 10/04/12   Bertha Stakes, MD  HYDROcodone-acetaminophen (NORCO) 5-325 MG tablet Take 1 tablet by mouth every 4 (four) hours as needed for moderate pain. 07/13/17   Lucas Mallow, MD  Insulin Glargine (BASAGLAR KWIKPEN) 100 UNIT/ML SOPN Inject 0.6 mLs (60 Units total) into the skin daily. 10/14/17   [name], [name], MD  Insulin Pen Needle (CARETOUCH PEN NEEDLES) 31G X 6  MM MISC 1 pen by Does not apply route at bedtime. 03/01/17   [name], MD  Lancets MISC Use to test blood sugar three times a day before meals. 09/14/13   Malena Catholic, MD  Misc. Devices MISC by Does not apply route. C-PAP    [provider]  oxyCODONE (OXY IR/ROXICODONE) 5 MG immediate release tablet Take 1 tablet (5 mg total) by mouth every 4 (four) hours as needed for moderate pain. 10/07/17   Lucas Mallow, MD    Family History Family History  Problem Relation Age of Onset  . Breast cancer Mother   . Hypertension Father   . Diabetes Maternal Grandmother   . Colon cancer Neg Hx   . Prostate cancer Neg Hx   . Heart attack Neg Hx     Social History Social History   Tobacco Use  . Smoking status: Never Smoker  . Smokeless tobacco: Never Used  Substance Use Topics  . Alcohol use: No    Alcohol/week: 0.0 oz  . Drug use: No     Allergies     Lisinopril   Review of Systems Review of Systems  Constitutional: Negative for chills and fever.  HENT: Positive for dental problem and facial swelling. Negative for sore throat, trouble swallowing and voice change.   Respiratory: Negative for stridor.   Gastrointestinal: Negative for nausea and vomiting.  All other systems reviewed and are negative.    Physical Exam Updated Vital Signs BP (!) 184/106   Pulse 96   Temp 97.7 F (36.5 C) (Oral)   Resp 15   SpO2 100%   Physical Exam  Constitutional: He appears well-developed and well-nourished. No distress.  HENT:  Head: Normocephalic and atraumatic.  Mouth/Throat: Oropharynx is clear and moist.  Dental cavities and poor oral dentition noted.  Multiple missing teeth.  Pain along tooth as depicted in image. No abscess noted, but swelling to the tissues lateral to the affected tooth. Midline uvula. No trismus. OP clear and moist. No oropharyngeal erythema or edema. Neck supple but with mild TTP on right side. Mild edema overlying right mandible.  Eyes: Pupils are equal, round, and reactive to light. Conjunctivae and EOM are normal.  Neck: Normal range of motion. Neck supple.  Cardiovascular: Normal rate, regular rhythm, S1 normal and S2 normal.  No murmur heard. Pulmonary/Chest: Effort normal and breath sounds normal. He has no wheezes. He has no rales.  Abdominal: Soft. He exhibits no distension. There is no tenderness. There is no guarding.  Musculoskeletal: Normal range of motion. He exhibits no edema or deformity.  Neurological: He is alert.  Cranial nerves grossly intact. Patient moves extremities symmetrically and with good coordination.  Skin: Skin is warm and dry. No rash noted. No erythema.  Psychiatric: He has a normal mood and affect. His behavior is normal. Judgment and thought content normal.  Nursing note and vitals reviewed.     ED Treatments / Results  Labs (all labs ordered are listed, but only abnormal  results are displayed) Labs Reviewed  CBC WITH DIFFERENTIAL/PLATELET  BASIC METABOLIC PANEL    EKG None  Radiology Ct Soft Tissue Neck W Contrast  Result Date: 11/08/2017 CLINICAL DATA:  Right lower dental pain with swelling and jaw and neck for 1 week. EXAM: CT NECK WITH CONTRAST TECHNIQUE: Multidetector CT imaging of the neck was performed using the standard protocol following the bolus administration of intravenous contrast. CONTRAST:  180m OMNIPAQUE IOHEXOL 300 MG/ML  SOLN COMPARISON:  Cervical spine CT 02/21/2007 FINDINGS: Pharynx and larynx: Negative for mass or inflammation Salivary glands: Symmetric and negative. Thyroid: Heterogeneous thyroid without discrete masslike area for measurement purposes. The right lobe is larger than the left, stable from comparison scan. Lymph nodes: No nodal enlargement.  No abnormal nodal density. Vascular: Negative.  No major venous occlusion. Limited intracranial: Negative Visualized orbits: Negative Mastoids and visualized paranasal sinuses: Clear Skeleton: No acute or aggressive finding.  Cervical facet spurring Upper chest: Negative Other: There is mild fat edema superficial to the right jaw. There solitary right lower molar is devitalized, but there is no periapical erosion or abscess. There are multiple cavities. IMPRESSION: Mild cellulitic change superficial to the right mandible, presumably odontogenic in this patient with history of dental pain. No abscess or deep space swelling. Electronically Signed   By: Monte Fantasia M.D.   On: 11/08/2017 11:01    Procedures Procedures (including critical care time)   Medications Ordered in ED Medications  clindamycin (CLEOCIN) IVPB 600 mg (has no administration in time range)     Initial Impression / Assessment and Plan / ED Course  I have reviewed the triage vital signs and the nursing notes.  Pertinent labs & imaging results that were available during my care of the patient were reviewed by me  and considered in my medical decision making (see chart for details).  Clinical Course as of Nov 08 1717  Tue Nov 08, 2017  0920 CT neck obtained due to TTP of right neck and immunosuppressed status of diabetes.  Differential diagnosis includes dental abscess, pharyngeal abscess, deep space infection of the neck.  Provided that CT neck shows no concerning features for deep space infection, patient can likely complete course of oral abx.   [AM]  3646 Reassessed.  Patient is feeling well.  Elevated blood pressures noted.  Patient did not take antihypertensives this morning.  Patient without any features of hypertensive urgency/emergency at this time, specifically no headache, chest pain, shortness of breath, dizziness, lightheadedness, or focal weakness/numbness.   [AM]    Clinical Course User Index [AM] Langston Masker B, PA-C    Patient nontoxic-appearing, afebrile, and in no acute distress.  Patient with no leukocytosis.  Examination concerning for cellulitis of the soft tissue surrounding the neck.  CT scan of the neck demonstrates no focal abscess or concerning features for  deep space infection of the head and neck.  Antibiotic therapy initiated with IV clindamycin, and continued with oral clindamycin.  I instructed patient to follow-up with his primary care provider as soon as possible, as he is having difficulties getting into dental care.  Dental resource dispensed.  Patient with intact airway tolerating secretions at time of discharge.  Return precautions were given to patient for any difficulty breathing, difficulty swallowing, worsening facial swelling, neck induration, or semitubular tenderness.  Patient is in understanding and agrees with the plan of care.  Of note, patient had stable enlarged right lobe of thyroid on CT scan.  Patient instructed to follow-up with primary care provider regarding this.  Final Clinical Impressions(s) / ED Diagnoses   Final diagnoses:  Dental infection    Elevated blood pressure reading in office with diagnosis of hypertension  Enlarged thyroid    ED Discharge Orders        Ordered    clindamycin (CLEOCIN) 300 MG capsule  3 times daily     11/08/17 1208    chlorhexidine gluconate, MEDLINE KIT, (PERIDEX) 0.12 % solution  2 times daily  11/08/17 1208    lidocaine (XYLOCAINE) 2 % solution  As needed     11/08/17 1208       Tamala Julian 11/08/17 1727    Julianne Rice, MD 11/11/17 1910

## 2017-11-09 ENCOUNTER — Telehealth: Payer: Self-pay | Admitting: *Deleted

## 2017-11-09 ENCOUNTER — Ambulatory Visit (INDEPENDENT_AMBULATORY_CARE_PROVIDER_SITE_OTHER): Payer: No Typology Code available for payment source | Admitting: Internal Medicine

## 2017-11-09 ENCOUNTER — Encounter: Payer: Self-pay | Admitting: Internal Medicine

## 2017-11-09 ENCOUNTER — Other Ambulatory Visit: Payer: Self-pay

## 2017-11-09 VITALS — BP 172/107 | HR 96 | Temp 97.5°F | Ht 73.0 in | Wt 282.6 lb

## 2017-11-09 DIAGNOSIS — I509 Heart failure, unspecified: Secondary | ICD-10-CM

## 2017-11-09 DIAGNOSIS — E119 Type 2 diabetes mellitus without complications: Secondary | ICD-10-CM

## 2017-11-09 DIAGNOSIS — I110 Hypertensive heart disease with heart failure: Secondary | ICD-10-CM

## 2017-11-09 DIAGNOSIS — Z8546 Personal history of malignant neoplasm of prostate: Secondary | ICD-10-CM

## 2017-11-09 DIAGNOSIS — Z79899 Other long term (current) drug therapy: Secondary | ICD-10-CM

## 2017-11-09 DIAGNOSIS — I10 Essential (primary) hypertension: Secondary | ICD-10-CM

## 2017-11-09 DIAGNOSIS — I4891 Unspecified atrial fibrillation: Secondary | ICD-10-CM

## 2017-11-09 DIAGNOSIS — K029 Dental caries, unspecified: Secondary | ICD-10-CM

## 2017-11-09 MED ORDER — AMOXICILLIN-POT CLAVULANATE 875-125 MG PO TABS: 1.0000 | ORAL_TABLET | Freq: Two times a day (BID) | ORAL | 0 refills | Status: AC

## 2017-11-09 NOTE — Assessment & Plan Note (Addendum)
Assessment:  Persistent tooth pain from right lower molar dental carry. Seen in ED yesterday and CT without abscess or deep space infection. Sent home with Rx for Clindamycin for mild cellulitis associated with dental infection. Pt feels pain and swelling have not improved and requests Rx for Augmentin which has helped quickly in the past.  He did not request Rx for pain medication.     Plan: Stop Clindamycin, Rx sent for Augmentin ds BID x7 days. Referral placed for dentist. Patients orange card expires this Friday but working on required paperwork during our visit. Patient to present to ED should he develop difficulty swallowing, breathing or chewing.

## 2017-11-09 NOTE — Telephone Encounter (Signed)
He didn't call anyone nor did he get the antibiotics prescribed by the ED. Frustrating. Referring to a dental clinic who takes the [facility] but unfortunately his expires on Friday... We'll see what we can do.

## 2017-11-09 NOTE — Assessment & Plan Note (Deleted)
Assessment:  Persistent tooth pain from right lower molar dental carry. Seen in ED yesterday and CT without abscess or deep space infection. Sent home with Rx for Clindamycin for mild cellulitis associated with dental infection. Pt feels pain and swelling have not improved and requests Rx for Augmentin which has helped quickly in the past.  He did not request Rx for pain medication.     Plan: Stop Clindamycin, Rx sent for Augmentin ds BID x7 days. Referral placed for dentist. Patients orange card expires this Friday but working on required paperwork during our visit. Patient to present to ED should he develop difficulty swallowing, breathing or chewing.

## 2017-11-09 NOTE — Telephone Encounter (Signed)
Of course! I prescribed Augmentin (worked for him in the past quickly), gave him additional information on low cost dental clinics and also referred him as well. He was working on the papers to renew his [employer] so hopefully that will go smoothly!

## 2017-11-09 NOTE — Patient Instructions (Addendum)
It was nice seeing you today. Thank you for choosing [facility] for your Primary Care.   Today we talked about:  1) Dental pain: [name] sorry you are having dental pain. I am referring you to a dentist. I have also switched your antibiotic to the one we discussed.  2) I also gave you a sample of Lantus. Please be sure to call your pharmacy to let them know you need more.  3) Please continue working on your paper work for the orange card.

## 2017-11-09 NOTE — Telephone Encounter (Signed)
Pt's [facility] card will expire on Friday, which I was not aware. Chilon called pt back - stated he will bring his paperwork w/ him today. Mamie informed.

## 2017-11-09 NOTE — Telephone Encounter (Signed)
The patient was given information to call dental clinic during last visit. Has he called the clinic yet?[name] He can try walking into the dental clinic as well. Please let me know if I can help.   Devanny Palecek

## 2017-11-09 NOTE — Assessment & Plan Note (Addendum)
Assessment:  Patient did not take his BP medications today and is subsequently hypertensive to 170/100. No headache, blurred vision or focal neurologic issues.      Plan: Instructed patient to take medications when he gets home. Ensured pt has refills available at his pharmacy.

## 2017-11-09 NOTE — Telephone Encounter (Signed)
Thank you for letting me know! Hopefully we can resolve this issue soon. I also mentioned that he can try [facility] dental clinic.

## 2017-11-09 NOTE — Telephone Encounter (Signed)
Call from pt - stated he continues to have dental pain; jaw swollen. He went to ED yesterday; given an ABX for infection. "Nothing helping; something needs to be done". He has tried oral gel and Tylenol. Last seen by Dr Maricela Bo on 6/14. Appt given to pt for today w/Dr Molt - nurse informed.

## 2017-11-09 NOTE — Progress Notes (Addendum)
CC: tooth pain  HPI:  [name] is a 62 y.o. M poor dentition, type-2 diabetes, atrial fibrillation, prostate cancer s/p RALP and CHF who presents today for dental referral.   He has been seen several times over the past few weeks with complaint of right mandibular molar pain. Was given information on low-cost and walk-in dental clinics but didn't go. Presented to ED 6/25 due to 1-week history of jaw swelling; he was afebrile and without leukocytosis. CT was without abscess or deep space infection but did show a mild cellulitis. He was given one dose of IV Clindamycin and prescribed PO clindamycin. Pain has persisted and he complains of a "funny taste" in his mouth. Requests Rx for Augmentin.   For details regarding today's visit and the status of their chronic medical issues, please refer to the assessment and plan. He did not take his blood pressure medications today.  Past Medical History:  Diagnosis Date  . Abscessed tooth    top back large cavity no pain or drainage, one on bottom  pt pulled tooth 4-5 months ago, right top large hole in tooth  . Allergic rhinitis   . BPH (benign prostatic hypertrophy)    Massive BPH noted on cystoscopy 1/23/ 2012 by Terry. [name].  . Cardiomyopathy ([place])   . CHF (congestive heart failure) ([place])   . Depression   . Diabetes mellitus 04/08/2008   type 2  . Foley catheter in place 07-05-17 placed  . Headache(784.0)    hx migraines none recent  . Hyperlipemia   . Hypertension   . Hypertensive cardiopathy 03/01/2006   2-D echocardiogram 02/01/2012 showed moderate LVH, mildly to moderately reduced left ventricular systolic function with an estimated ejection fraction of 40-45%, and diffuse hypokinesis.  A nuclear medicine stress study done 01/31/2012 showed no reversible ischemia, a small mid anterior wall fixed defect/infarct, and ejection fraction 42%.      . Neck pain   . Obstructive sleep apnea 03/06/2008   Sleep study 03/06/08 showed severe  OSA/hypopnea syndrome, with successful CPAP titration to 13 CWP using a medium ResMed Mirage Quattro full face mask with heated humidifier.   . Renal calculus 05/29/2010   CT scan of abdomen/pelvis on 05/29/2010 showed an obstructing approximate 1-2 mm calculus at the left UVJ, and an approximate 1-2 mm left lower pole renal calculus.   Patient had continuing severe pain , and an elevation of his serum creatinine to a value of 1.75 on 06/06/2010.  The stone had apparently passed and was not seen on repeat CT 06/08/2010.   Review of Systems:   General: Denies fevers, chills, weight loss, headache HEENT: Admits to tooth pain and jaw swelling. Admits to change in taste. Denies acute changes in vision, sore throat, dysphagia or dyspnea Cardiac: Denies CP or SOB Pulmonary: Denies cough or trouble breathing Abd: Denies abdominal pain, changes in bowels Extremities: Denies weakness or swelling  Physical Exam: General: Alert, in no acute distress. Pleasant and conversant. Appears well. HEENT: No icterus, injection or ptosis. No hoarseness or dysarthria. Missing several teeth and several with carries. Some mild gum erythema of the right mandibular region but no frank abscess or purulence. Minimal fullness of cheek. Cardiac: IRIR, no murmur.  Pulmonary: CTA BL with normal WOB on RA. Able to speak in complete sentences. No stridor.  Abd: Soft, non-tender. +bs Extremities: Warm, perfused. No significant pedal edema.   Vitals:   11/09/17 1532  BP: (!) 172/107  Pulse: 96  Temp: (!) 97.5 F (  36.4 C)  SpO2: 100%  Weight: 282 lb 9.6 oz (128.2 kg)  Height: 6\' 1"  (1.854 m)   Body mass index is 37.28 kg/m.  Assessment & Plan:   See Encounters Tab for problem based charting.  Patient discussed with Terry Parks   Medicine attending: Medical history, presenting problems, physical findings, and medications, reviewed with resident physician Terry Parks Terry Parks on the day of the patient visit and I concur  with her evaluation and management plan.  Murriel Hopper, MD, [place]  Hematology-Oncology/Internal Medicine

## 2017-11-11 ENCOUNTER — Encounter: Payer: Self-pay | Admitting: *Deleted

## 2017-11-11 ENCOUNTER — Other Ambulatory Visit: Payer: Self-pay | Admitting: Internal Medicine

## 2017-11-11 DIAGNOSIS — E11319 Type 2 diabetes mellitus with unspecified diabetic retinopathy without macular edema: Secondary | ICD-10-CM

## 2017-11-11 DIAGNOSIS — E1142 Type 2 diabetes mellitus with diabetic polyneuropathy: Secondary | ICD-10-CM

## 2017-11-11 NOTE — Progress Notes (Signed)
Referral for ophthalmology per Dr. Cathren Laine suggestion as follow up of IM retinal scan.   [name], MD Internal Medicine PGY1 Pager:[phone] 11/11/2017, 2:55 PM

## 2017-12-19 ENCOUNTER — Ambulatory Visit: Payer: Self-pay | Admitting: Pharmacist

## 2017-12-20 ENCOUNTER — Other Ambulatory Visit: Payer: Self-pay

## 2017-12-20 ENCOUNTER — Other Ambulatory Visit: Payer: Self-pay | Admitting: Internal Medicine

## 2017-12-20 ENCOUNTER — Encounter (HOSPITAL_COMMUNITY): Payer: Self-pay

## 2017-12-20 ENCOUNTER — Emergency Department (HOSPITAL_COMMUNITY)
Admission: EM | Admit: 2017-12-20 | Discharge: 2017-12-20 | Disposition: A | Discharge status: Home / Self Care | Payer: Medicaid Other | Attending: Emergency Medicine | Admitting: Emergency Medicine

## 2017-12-20 DIAGNOSIS — Z794 Long term (current) use of insulin: Secondary | ICD-10-CM | POA: Insufficient documentation

## 2017-12-20 DIAGNOSIS — R32 Unspecified urinary incontinence: Secondary | ICD-10-CM | POA: Diagnosis not present

## 2017-12-20 DIAGNOSIS — E1140 Type 2 diabetes mellitus with diabetic neuropathy, unspecified: Secondary | ICD-10-CM | POA: Insufficient documentation

## 2017-12-20 DIAGNOSIS — I110 Hypertensive heart disease with heart failure: Secondary | ICD-10-CM | POA: Insufficient documentation

## 2017-12-20 DIAGNOSIS — B372 Candidiasis of skin and nail: Secondary | ICD-10-CM

## 2017-12-20 DIAGNOSIS — Z7901 Long term (current) use of anticoagulants: Secondary | ICD-10-CM | POA: Insufficient documentation

## 2017-12-20 DIAGNOSIS — Z79899 Other long term (current) drug therapy: Secondary | ICD-10-CM | POA: Diagnosis not present

## 2017-12-20 DIAGNOSIS — N390 Urinary tract infection, site not specified: Secondary | ICD-10-CM | POA: Insufficient documentation

## 2017-12-20 DIAGNOSIS — I5042 Chronic combined systolic (congestive) and diastolic (congestive) heart failure: Secondary | ICD-10-CM | POA: Insufficient documentation

## 2017-12-20 DIAGNOSIS — B3749 Other urogenital candidiasis: Secondary | ICD-10-CM | POA: Diagnosis not present

## 2017-12-20 DIAGNOSIS — I481 Persistent atrial fibrillation: Secondary | ICD-10-CM | POA: Diagnosis not present

## 2017-12-20 DIAGNOSIS — E785 Hyperlipidemia, unspecified: Secondary | ICD-10-CM | POA: Diagnosis not present

## 2017-12-20 DIAGNOSIS — R21 Rash and other nonspecific skin eruption: Secondary | ICD-10-CM | POA: Diagnosis present

## 2017-12-20 DIAGNOSIS — Z8546 Personal history of malignant neoplasm of prostate: Secondary | ICD-10-CM | POA: Insufficient documentation

## 2017-12-20 LAB — URINALYSIS, ROUTINE W REFLEX MICROSCOPIC
BILIRUBIN URINE: NEGATIVE
Glucose, UA: >=500 mg/dL — AB
KETONES UR: NEGATIVE mg/dL
NITRITE: NEGATIVE
PH: 5 (ref 5.0–8.0)
Protein, ur: 100 mg/dL — AB
Specific Gravity, Urine: 1.024 (ref 1.005–1.030)
WBC, UA: >50 WBC/hpf — ABNORMAL HIGH (ref 0–5)

## 2017-12-20 LAB — CBG MONITORING, ED: Glucose-Capillary: 250 mg/dL — ABNORMAL HIGH (ref 70–99)

## 2017-12-20 MED ORDER — FLUCONAZOLE 200 MG PO TABS: 200.0000 mg | ORAL_TABLET | Freq: Every day | ORAL | 0 refills | Status: AC

## 2017-12-20 MED ORDER — CEPHALEXIN 500 MG PO CAPS: 500.0000 mg | ORAL_CAPSULE | Freq: Four times a day (QID) | ORAL | 0 refills | Status: DC

## 2017-12-20 MED ORDER — FLUCONAZOLE 200 MG PO TABS: 200.0000 mg | ORAL_TABLET | Freq: Every day | ORAL | 0 refills | Status: DC

## 2017-12-20 NOTE — Telephone Encounter (Signed)
Refill Request-Pt states meds were called in to [facility] in correctly by the ED.    fluconazole (DIFLUCAN) 200 MG tablet  cephALEXin (KEFLEX) 500 MG capsule

## 2017-12-20 NOTE — Telephone Encounter (Signed)
Spoke w/ pt and elizabeth at [employer], settled

## 2017-12-20 NOTE — ED Triage Notes (Signed)
Pt presents to ED from home for a rash in his groin. Pt reports recent prostate surgery, and has incontinence since then. Pt reports that he has a rash in his groin, that is getting worse. No fever. Pt denies other symptoms.

## 2017-12-20 NOTE — Telephone Encounter (Signed)
Pt is at the outpatient pharmacy, states the meds is not covered by the orange card. Requesting the nurse to call back.

## 2017-12-20 NOTE — ED Provider Notes (Signed)
[facility] DEPT Provider Note   CSN: [id number] Arrival date & time: 12/20/17  0645     History   Chief Complaint Chief Complaint  Patient presents with  . Rash  . Urinary Frequency    incontinence    HPI Terry Parks is a 62 y.o. male.  Status post robotic assisted prostatectomy on 10/06/2017 by Dr. Gloriann Loan locally for prostate cancer.  Patient now complains of a significant perineal rash and persistent urinary incontinence.  He is diabetic.  No fever, sweats, chills, dysuria, hematuria.  Severity of symptoms is moderate to severe.  Palpation makes symptoms worse.     Past Medical History:  Diagnosis Date  . Abscessed tooth    top back large cavity no pain or drainage, one on bottom  pt pulled tooth 4-5 months ago, right top large hole in tooth  . Allergic rhinitis   . Atrial fibrillation ([place])   . BPH (benign prostatic hypertrophy)    Massive BPH noted on cystoscopy 1/23/ 2012 by Dr. Risa Grill.  . Cardiomyopathy ([place])   . CHF (congestive heart failure) ([place])   . Depression   . Diabetes mellitus 04/08/2008   type 2  . Foley catheter in place 07-05-17 placed  . Headache(784.0)    hx migraines none recent  . Hyperlipemia   . Hypertension   . Hypertensive cardiopathy 03/01/2006   2-D echocardiogram 02/01/2012 showed moderate LVH, mildly to moderately reduced left ventricular systolic function with an estimated ejection fraction of 40-45%, and diffuse hypokinesis.  A nuclear medicine stress study done 01/31/2012 showed no reversible ischemia, a small mid anterior wall fixed defect/infarct, and ejection fraction 42%.      . Neck pain   . Obstructive sleep apnea 03/06/2008   Sleep study 03/06/08 showed severe OSA/hypopnea syndrome, with successful CPAP titration to 13 CWP using a medium ResMed Mirage Quattro full face mask with heated humidifier.   . Renal calculus 05/29/2010   CT scan of abdomen/pelvis on 05/29/2010 showed an obstructing  approximate 1-2 mm calculus at the left UVJ, and an approximate 1-2 mm left lower pole renal calculus.   Patient had continuing severe pain , and an elevation of his serum creatinine to a value of 1.75 on 06/06/2010.  The stone had apparently passed and was not seen on repeat CT 06/08/2010.    Patient Active Problem List   Diagnosis Date Noted  . Pain due to dental caries 11/09/2017  . Tooth pain 10/29/2017  . Prostate cancer ([place]) 10/06/2017  . BPH (benign prostatic hyperplasia) 07/13/2017  . Bladder mass 07/13/2017  . AKI (acute kidney injury) ([place])   . Elevated troponin   . Persistent atrial fibrillation ([place])   . Acute renal failure (ARF) ([place]) 06/06/2017  . Acute on chronic combined systolic (congestive) and diastolic (congestive) heart failure ([place]) 06/06/2017  . Urinary straining 11/02/2016  . Encounter for HCV screening test for low risk patient 11/02/2016  . Chest pain 11/02/2016  . Vitamin D deficiency 06/22/2016  . Neuropathy in diabetes ([place]) 10/07/2015  . Erectile dysfunction 04/17/2014  . Rash 04/17/2014  . Allergic rhinitis 10/19/2012  . Diabetic retinopathy ([place]) 10/04/2012  . Cough 03/30/2012  . BPH   . Nephrolithiasis 05/29/2010  . Type 2 diabetes mellitus with peripheral neuropathy ([place]) 04/08/2008  . Obstructive sleep apnea   . Morbid obesity ([place])   . Hyperlipidemia associated with type 2 diabetes mellitus ([place])   . Essential hypertension   . Chronic combined systolic and diastolic CHF (congestive  heart failure) ([place]) 03/01/2006    Past Surgical History:  Procedure Laterality Date  . big toe nails removed Bilateral 20 yrs ago  . CARDIOVERSION N/A 06/08/2017   Procedure: CARDIOVERSION;  Surgeon: Josue Hector, MD;  Location: [facility] ENDOSCOPY;  Service: Cardiovascular;  Laterality: N/A;  . CYSTOSCOPY W/ RETROGRADES    . LYMPHADENECTOMY Bilateral 10/06/2017   Procedure: LYMPHADENECTOMY;  Surgeon: [place] Mallow, MD;  Location: [facility] ORS;  Service: Urology;   Laterality: Bilateral;  . ROBOT ASSISTED LAPAROSCOPIC RADICAL PROSTATECTOMY N/A 10/06/2017   Procedure: XI ROBOTIC ASSISTED LAPAROSCOPIC RADICAL PROSTATECTOMY;  Surgeon: [place] Mallow, MD;  Location: [facility] ORS;  Service: Urology;  Laterality: N/A;  . TRANSURETHRAL RESECTION OF BLADDER TUMOR N/A 07/13/2017   Procedure: TRANSURETHRAL RESECTION OF PROSTATE;  Surgeon: [place] Mallow, MD;  Location: [facility] ORS;  Service: Urology;  Laterality: N/A;        Home Medications    Prior to Admission medications   Medication Sig Start Date End Date Taking? Authorizing Provider  atorvastatin (LIPITOR) 40 MG tablet Take 40 mg by mouth daily. 10/14/17  Yes [provider]  DULoxetine (CYMBALTA) 60 MG capsule Take 1 capsule (60 mg total) by mouth daily. 10/14/17 10/14/18 Yes [name], Vahini, MD  glucose blood (TRUETRACK TEST) test strip Use as directed to test blood sugar three times a day before meals. 10/04/12  Yes Bertha Stakes, MD  insulin glargine (LANTUS) 100 UNIT/ML injection Inject 60 Units into the skin at bedtime.   Yes [provider]  Insulin Pen Needle (CARETOUCH PEN NEEDLES) 31G X 6 MM MISC 1 pen by Does not apply route at bedtime. 03/01/17  Yes [name], Vahini, MD  isosorbide mononitrate (IMDUR) 30 MG 24 hr tablet Take 1 tablet (30 mg total) by mouth daily. Patient taking differently: Take 30 mg by mouth every evening.  07/06/17  Yes Forde Dandy, PharmD  Lancets MISC Use to test blood sugar three times a day before meals. 09/14/13  Yes Boggala, Gaynelle Adu, MD  losartan (COZAAR) 50 MG tablet Take 1 tablet (50 mg total) by mouth daily. 10/14/17  Yes [name], Vahini, MD  metFORMIN (GLUCOPHAGE-XR) 500 MG 24 hr tablet Take 500 mg by mouth daily with breakfast.   Yes [provider]  Misc. Devices MISC by Does not apply route. C-PAP   Yes [provider]  amiodarone (PACERONE) 200 MG tablet Take 1 tablet (200 mg total) by mouth daily. 10/14/17 11/13/17  [name], [name],  MD  amLODipine-atorvastatin (CADUET) 5-40 MG tablet Take 1 tablet by mouth daily. Patient not taking: Reported on 12/20/2017 10/14/17   [name], MD  carvedilol (COREG) 12.5 MG tablet Take 1 tablet (12.5 mg total) by mouth 2 (two) times daily with a meal. 10/14/17 11/13/17  [name], [name], MD  cephALEXin (KEFLEX) 500 MG capsule Take 1 capsule (500 mg total) by mouth 4 (four) times daily. 12/20/17   Nat Christen, MD  chlorhexidine gluconate, MEDLINE KIT, (PERIDEX) 0.12 % solution Use as directed 15 mLs in the mouth or throat 2 (two) times daily. Swish and spit.  Do not swallow. Patient not taking: Reported on 12/20/2017 11/08/17   Langston Masker B, PA-C  Empagliflozin-metFORMIN HCl ER (SYNJARDY XR) 09-998 MG TB24 Take 2 tablets by mouth daily. Patient not taking: Reported on 12/20/2017 08/25/17   [name], MD  fluconazole (DIFLUCAN) 200 MG tablet Take 1 tablet (200 mg total) by mouth daily for 7 days. 12/20/17 12/27/17  Nat Christen, MD  furosemide (  LASIX) 40 MG tablet Take 1 tablet (40 mg total) by mouth 2 (two) times daily. 10/14/17 11/13/17  [name], MD  gabapentin (NEURONTIN) 300 MG capsule Take 1 capsule (300 mg total) by mouth daily. 10/14/17 11/13/17  [name], MD  Insulin Glargine (BASAGLAR KWIKPEN) 100 UNIT/ML SOPN Inject 0.6 mLs (60 Units total) into the skin daily. Patient not taking: Reported on 11/08/2017 10/14/17   [name], MD  lidocaine (XYLOCAINE) 2 % solution Use as directed 15 mLs in the mouth or throat as needed for mouth pain. Swish and spit.  Do not swallow. Patient not taking: Reported on 12/20/2017 11/08/17   Langston Masker B, PA-C  XARELTO 20 MG TABS tablet Take 20 mg by mouth daily. 10/14/17   [provider]    Family History Family History  Problem Relation Age of Onset  . Breast cancer Mother   . Hypertension Father   . Diabetes Maternal Grandmother   . Colon cancer Neg Hx   . Prostate cancer Neg Hx   . Heart attack Neg Hx     Social  History Social History   Tobacco Use  . Smoking status: Never Smoker  . Smokeless tobacco: Never Used  Substance Use Topics  . Alcohol use: No    Alcohol/week: 0.0 oz  . Drug use: No     Allergies   Lisinopril   Review of Systems Review of Systems  All other systems reviewed and are negative.    Physical Exam Updated Vital Signs BP (!) 185/98   Pulse 82   Temp 97.9 F (36.6 C) (Oral)   Resp 16   Ht '6\' 1"'$  (1.854 m)   Wt 131.5 kg (290 lb)   SpO2 99%   BMI 38.26 kg/m   Physical Exam  Constitutional: He is oriented to person, place, and time. He appears well-developed and well-nourished.  HENT:  Head: Normocephalic and atraumatic.  Eyes: Conjunctivae are normal.  Neck: Neck supple.  Cardiovascular: Normal rate and regular rhythm.  Pulmonary/Chest: Effort normal and breath sounds normal.  Abdominal: Soft. Bowel sounds are normal.  Musculoskeletal: Normal range of motion.  Neurological: He is alert and oriented to person, place, and time.  Skin:  Perineum: Extensive erythematous papular rash with waxy white superficial excoriation  Psychiatric: He has a normal mood and affect. His behavior is normal.  Nursing note and vitals reviewed.    ED Treatments / Results  Labs (all labs ordered are listed, but only abnormal results are displayed) Labs Reviewed  URINALYSIS, ROUTINE W REFLEX MICROSCOPIC - Abnormal; Notable for the following components:      Result Value   Glucose, UA >=500 (*)    Hgb urine dipstick SMALL (*)    Protein, ur 100 (*)    Leukocytes, UA MODERATE (*)    WBC, UA >50 (*)    Bacteria, UA RARE (*)    All other components within normal limits  CBG MONITORING, ED - Abnormal; Notable for the following components:   Glucose-Capillary 250 (*)    All other components within normal limits  URINE CULTURE    EKG None  Radiology No results found.  Procedures Procedures (including critical care time)  Medications Ordered in ED Medications  - No data to display   Initial Impression / Assessment and Plan / ED Course  I have reviewed the triage vital signs and the nursing notes.  Pertinent labs & imaging results that were available during my care of the patient were reviewed by me  and considered in my medical decision making (see chart for details).     Patient presents with extensive perineal candidiasis.  He is status post prostatectomy on 10/06/17.  His glucose is moderately elevated.  Discussed his care with the urologist Dr. Gloriann Loan.  Will Rx Diflucan orally, Keflex 500 mg for a urinary tract infection, and a condom catheter to help with his urinary incontinence issue.  These findings were discussed with the patient.  He will follow-up with urology.  Final Clinical Impressions(s) / ED Diagnoses   Final diagnoses:  Yeast dermatitis  Urinary tract infection without hematuria, site unspecified  Urinary incontinence, unspecified type    ED Discharge Orders        Ordered    fluconazole (DIFLUCAN) 200 MG tablet  Daily,   Status:  Discontinued     12/20/17 1203    cephALEXin (KEFLEX) 500 MG capsule  4 times daily,   Status:  Discontinued     12/20/17 1203    cephALEXin (KEFLEX) 500 MG capsule  4 times daily     12/20/17 1206    fluconazole (DIFLUCAN) 200 MG tablet  Daily     12/20/17 1206       Nat Christen, MD 12/20/17 1235

## 2017-12-20 NOTE — Discharge Instructions (Signed)
I have prescribed an anti-yeast medication and an antibiotic for your urine.  Initially, you can use over-the-counter yeast medication ointment.  Also prescription for condom catheter.  Call the urologist for a follow-up appointment.

## 2017-12-22 LAB — URINE CULTURE: Culture: 50000 — AB

## 2017-12-23 ENCOUNTER — Ambulatory Visit: Payer: Self-pay | Admitting: Pharmacist

## 2017-12-23 ENCOUNTER — Encounter: Payer: Self-pay | Admitting: Internal Medicine

## 2017-12-23 ENCOUNTER — Telehealth: Payer: Self-pay

## 2017-12-23 NOTE — Progress Notes (Signed)
ED Antimicrobial Stewardship Positive Culture Follow Up   Terry Parks is an 62 y.o. male who presented to [facility] on 12/20/2017 with a chief complaint of progressively worsening rash around groin and urinary incontinence. Chief Complaint  Patient presents with  . Rash  . Urinary Frequency    incontinence    Recent Results (from the past 720 hour(s))  Urine culture     Status: Abnormal   Collection Time: 12/20/17 10:06 AM  Result Value Ref Range Status   Specimen Description   Final    URINE, CLEAN CATCH Performed at [facility], [place] [address], [place], [place] [address]    Special Requests   Final    NONE Performed at [facility], [place] [address] [place], [place] [address]    Culture 50,000 COLONIES/mL ENTEROBACTER CLOACAE (A)  Final   Report Status 12/22/2017 FINAL  Final   Organism ID, Bacteria ENTEROBACTER CLOACAE (A)  Final      Susceptibility   Enterobacter cloacae - MIC*    CEFAZOLIN >=64 RESISTANT Resistant     CEFTRIAXONE >=64 RESISTANT Resistant     CIPROFLOXACIN <=0.25 SENSITIVE Sensitive     GENTAMICIN <=1 SENSITIVE Sensitive     IMIPENEM <=0.25 SENSITIVE Sensitive     NITROFURANTOIN 64 INTERMEDIATE Intermediate     TRIMETH/SULFA <=20 SENSITIVE Sensitive     PIP/TAZO 32 INTERMEDIATE Intermediate     * 50,000 COLONIES/mL ENTEROBACTER CLOACAE    Patient treated with Keflex for UTI and fluconazole for perineal candidiasis. Patient does not need treatment for UTI. Stop Keflex and continue fluconazole for prescribed course.   ED Provider: Rodell Perna, PA-C   Jackson Latino, PharmD PGY1 Pharmacy Resident Phone [phone] 12/23/2017     9:39 AM

## 2017-12-23 NOTE — Telephone Encounter (Signed)
No treatment needed for UC done ED 12/20/17 Instructed to stop Keflex

## 2018-01-05 ENCOUNTER — Ambulatory Visit (INDEPENDENT_AMBULATORY_CARE_PROVIDER_SITE_OTHER): Payer: Self-pay | Admitting: Internal Medicine

## 2018-01-05 DIAGNOSIS — N393 Stress incontinence (female) (male): Secondary | ICD-10-CM

## 2018-01-05 DIAGNOSIS — G4733 Obstructive sleep apnea (adult) (pediatric): Secondary | ICD-10-CM

## 2018-01-05 DIAGNOSIS — E119 Type 2 diabetes mellitus without complications: Secondary | ICD-10-CM

## 2018-01-05 DIAGNOSIS — I5042 Chronic combined systolic (congestive) and diastolic (congestive) heart failure: Secondary | ICD-10-CM

## 2018-01-05 DIAGNOSIS — Z87440 Personal history of urinary (tract) infections: Secondary | ICD-10-CM

## 2018-01-05 DIAGNOSIS — Z9079 Acquired absence of other genital organ(s): Secondary | ICD-10-CM

## 2018-01-05 DIAGNOSIS — I110 Hypertensive heart disease with heart failure: Secondary | ICD-10-CM

## 2018-01-05 DIAGNOSIS — B3749 Other urogenital candidiasis: Secondary | ICD-10-CM | POA: Insufficient documentation

## 2018-01-05 DIAGNOSIS — I4891 Unspecified atrial fibrillation: Secondary | ICD-10-CM

## 2018-01-05 DIAGNOSIS — Z8546 Personal history of malignant neoplasm of prostate: Secondary | ICD-10-CM

## 2018-01-05 NOTE — Progress Notes (Addendum)
CC: Perineal candidiasis  HPI:  Terry Parks is a 62 y.o. male with essential hypertension, chronic combined systolic and diastolic heart failure, atrial fibrillation, prostate cancer status post prostatectomy on 10/06/2017, OSA, type 2 diabetes mellitus who presents for follow-up regarding perineal candidiasis. Please see problem based charting for evaluation, assessment, and plan.  Past Medical History:  Diagnosis Date  . Abscessed tooth    top back large cavity no pain or drainage, one on bottom  pt pulled tooth 4-5 months ago, right top large hole in tooth  . AKI (acute kidney injury) ([place])   . Allergic rhinitis   . Atrial fibrillation ([place])   . BPH (benign prostatic hypertrophy)    Massive BPH noted on cystoscopy 1/23/ 2012 by Dr. Risa Grill.  . Cardiomyopathy ([place])   . CHF (congestive heart failure) ([place])   . Cough 03/30/2012  . Depression   . Diabetes mellitus 04/08/2008   type 2  . Foley catheter in place 07-05-17 placed  . Headache(784.0)    hx migraines none recent  . Hyperlipemia   . Hypertension   . Hypertensive cardiopathy 03/01/2006   2-D echocardiogram 02/01/2012 showed moderate LVH, mildly to moderately reduced left ventricular systolic function with an estimated ejection fraction of 40-45%, and diffuse hypokinesis.  A nuclear medicine stress study done 01/31/2012 showed no reversible ischemia, a small mid anterior wall fixed defect/infarct, and ejection fraction 42%.      . Neck pain   . Obstructive sleep apnea 03/06/2008   Sleep study 03/06/08 showed severe OSA/hypopnea syndrome, with successful CPAP titration to 13 CWP using a medium ResMed Mirage Quattro full face mask with heated humidifier.   . Rash 04/17/2014  . Renal calculus 05/29/2010   CT scan of abdomen/pelvis on 05/29/2010 showed an obstructing approximate 1-2 mm calculus at the left UVJ, and an approximate 1-2 mm left lower pole renal calculus.   Patient had continuing severe pain , and an elevation of  his serum creatinine to a value of 1.75 on 06/06/2010.  The stone had apparently passed and was not seen on repeat CT 06/08/2010.  [name] Tooth pain 10/29/2017  . Urinary straining 11/02/2016   Review of Systems:    Has bilateral leg pain, fatigue Denies shortness of breath, chest pain, headaches  Physical Exam:  Vitals:   01/05/18 0840  BP: (!) 185/112  Pulse: 98  Temp: 97.8 F (36.6 C)  TempSrc: Oral  SpO2: 99%  Weight: 291 lb 6.4 oz (132.2 kg)  Height: 6\' 1"  (1.854 m)   Physical Exam  Constitutional: He appears well-developed and well-nourished. No distress.  HENT:  Head: Normocephalic and atraumatic.  Eyes: Conjunctivae are normal.  Cardiovascular: Normal rate, regular rhythm and normal heart sounds.  Respiratory: Effort normal and breath sounds normal. No respiratory distress. He has no wheezes.  GI: Soft. Bowel sounds are normal. He exhibits no distension. There is no tenderness.  Genitourinary:  Genitourinary Comments: Wet perineal and groin area, swelling, erythematous, and scaling of skin present in the perineal area  Musculoskeletal: He exhibits no edema.  Neurological: He is alert.  Skin: He is not diaphoretic.  Psychiatric: He has a normal mood and affect. His behavior is normal. Judgment and thought content normal.   Assessment & Plan:   See Encounters Tab for problem based charting.  Perineal candidiasis The patient presents for follow-up regarding perineal candidiasis and urinary tract infection that was diagnosed in the ED on 12/20/2017.  During his ED visit UA was ordered which showed  positive for moderate amount of leukocytes, >50 white blood cells, rare bacteria. Urine culture grew 50,000 colonies of Enterobacter Colace CA that was resistant to cefazolin and ceftriaxone.  The patient was initially prescribed Diflucan, Keflex 500 mg 4 times daily, and a condom catheter.  However, after the patient's urine culture returned he was told to stop the keflex, but patient  states that he did not get this message and he completed the Keflex dose.  Assessment and plan Urinary incontinence has caused him to develop perineal candidiasis secondary to him being constantly wet.  He is also on Jardiance that places him at risk for candidiasis.  -recommended the patient continue Jardiance at this time as the candidiasis is likely more secondary to surgical complications -We will prescribe Diflucan that the patient can get at it affordable cost with Dr. Julianne Rice assistance  Stress incontinence The patient had a radical prostatectomy done in May 2019 and he developed  urinary continence since that time. The patient reports that the incontinence is exasperated whenever he moves in any direction, coughs.  He feels that his " urine keeps flowing out like a faucet is on and he cannot control it".  Assessment and plan Per the patient's history the patient likely has stress incontinence secondary to likely surgical complications.  It is possible that his urinary sphincter or a nerve in the perineal region has been affected that is causing his continued urinary incontinence.  Referred him to urology to be further evaluated.  -Called the patient's urologist Dr. Purvis Sheffield office and was able to get the patient an earlier appointment on August 26 at 10 AM  Patient discussed with Dr. Rebeca Alert

## 2018-01-05 NOTE — Patient Instructions (Signed)
It was a pleasure to see you today [name]. Please make the following changes:  -We are trying to find affordable nystatin for you and will let you know once the prescription has been sent  -Please follow with urology as soon as possible  If you have any questions or concerns, please call our clinic at [phone] between 9am-5pm and after hours call [phone] and ask for the internal medicine resident on call. If you feel you are having a medical emergency please call 911.   Thank you, we look forward to help you remain healthy!  Terry Mage, MD Internal Medicine PGY2

## 2018-01-05 NOTE — Assessment & Plan Note (Signed)
The patient presents for follow-up regarding perineal candidiasis and urinary tract infection that was diagnosed in the ED on 12/20/2017.  The patient states that since his radical prostatectomy done in May 2019 he has noticed that he has continued to have urinary continence that is exasperated whenever he moves in any direction, coughs.  He feels that his " urine keeps flowing out like a faucet is on and he cannot control it".  During his ED visit UA was ordered which showed positive for moderate amount of leukocytes, >50 white blood cells, rare bacteria. Urine culture grew 50,000 colonies of Enterobacter Colace CA that was resistant to cefazolin and ceftriaxone.  The patient was initially prescribed Diflucan, Keflex 500 mg 4 times daily, and a condom catheter.  However, after the patient's urine culture returned he was told to stop the keflex, but patient states that he did not get this message and he completed the Keflex dose.  Assessment and plan Patient has urinary incontinence secondary to likely surgical complications.  It is possible that his urinary sphincter or a nerve in the perineal region has been affected that is causing his continued urinary incontinence.  Urinary incontinence has caused him to develop perineal candidiasis secondary to him being constantly wet.  He is also on Jardiance that places him at risk for candidiasis.  -recommended the patient continue Jardiance at this time as the candidiasis is likely more secondary to surgical complications -We will prescribe Diflucan that the patient can get at it affordable cost with Dr. Julianne Rice assistance -Called the patient's urologist Dr. Purvis Sheffield office and was able to get the patient an earlier appointment on August 26 at 10 AM

## 2018-01-06 ENCOUNTER — Other Ambulatory Visit: Payer: Self-pay | Admitting: Internal Medicine

## 2018-01-06 ENCOUNTER — Telehealth: Payer: Self-pay | Admitting: *Deleted

## 2018-01-06 DIAGNOSIS — N393 Stress incontinence (female) (male): Secondary | ICD-10-CM | POA: Insufficient documentation

## 2018-01-06 MED ORDER — FLUCONAZOLE 200 MG PO TABS: 200.0000 mg | ORAL_TABLET | Freq: Every day | ORAL | 0 refills | Status: DC

## 2018-01-06 MED FILL — FLUCONAZOLE 200 MG TABLET: 200 | 10 days supply | Qty: 10 | Fill #0

## 2018-01-06 NOTE — Assessment & Plan Note (Signed)
The patient had a radical prostatectomy done in May 2019 and he developed  urinary continence since that time. The patient reports that the incontinence is exasperated whenever he moves in any direction, coughs.  He feels that his " urine keeps flowing out like a faucet is on and he cannot control it".  Assessment and plan Per the patient's history the patient likely has stress incontinence secondary to likely surgical complications.  It is possible that his urinary sphincter or a nerve in the perineal region has been affected that is causing his continued urinary incontinence.  Referred him to urology to be further evaluated.  -Called the patient's urologist Dr. Purvis Sheffield office and was able to get the patient an earlier appointment on August 26 at 23 AM

## 2018-01-06 NOTE — Progress Notes (Signed)
Internal Medicine Clinic Attending  Case discussed with Dr. Maricela Bo at the time of the visit.  We reviewed the resident's history and exam and pertinent patient test results.  I agree with the assessment, diagnosis, and plan of care documented in the resident's note.  Presenting with a large amount of stress incontinence and resulting perineal candidiasis after prostatectomy. Started on nystatin, will ensure can obtain it, otherwise defer management to his urologist. Unclear if his pyuria with urine culture growing enterobacter cloacae bears treatment or not. Incontinence could be symptom of UTI, otherwise no symptoms to prompt treatment.   Lenice Pressman, M.D., Ph.D.

## 2018-01-06 NOTE — Progress Notes (Signed)
Called patient and informed him to [facility] and also to go to his urology appointment on august 26th 10am.

## 2018-01-08 ENCOUNTER — Inpatient Hospital Stay (HOSPITAL_COMMUNITY): Payer: Medicaid Other

## 2018-01-08 ENCOUNTER — Emergency Department (HOSPITAL_COMMUNITY): Payer: Medicaid Other

## 2018-01-08 ENCOUNTER — Encounter (HOSPITAL_COMMUNITY): Payer: Self-pay

## 2018-01-08 ENCOUNTER — Other Ambulatory Visit: Payer: Self-pay

## 2018-01-08 ENCOUNTER — Inpatient Hospital Stay (HOSPITAL_COMMUNITY)
Admission: EM | Admit: 2018-01-08 | Discharge: 2018-01-11 | DRG: 074 | Disposition: A | Discharge status: Home Health | Payer: Medicaid Other | Attending: Student in an Organized Health Care Education/Training Program | Admitting: Student in an Organized Health Care Education/Training Program

## 2018-01-08 DIAGNOSIS — Z8249 Family history of ischemic heart disease and other diseases of the circulatory system: Secondary | ICD-10-CM

## 2018-01-08 DIAGNOSIS — Z6841 Body Mass Index (BMI) 40.0 and over, adult: Secondary | ICD-10-CM

## 2018-01-08 DIAGNOSIS — E11319 Type 2 diabetes mellitus with unspecified diabetic retinopathy without macular edema: Secondary | ICD-10-CM | POA: Diagnosis not present

## 2018-01-08 DIAGNOSIS — I428 Other cardiomyopathies: Secondary | ICD-10-CM | POA: Diagnosis present

## 2018-01-08 DIAGNOSIS — Z7901 Long term (current) use of anticoagulants: Secondary | ICD-10-CM

## 2018-01-08 DIAGNOSIS — I110 Hypertensive heart disease with heart failure: Secondary | ICD-10-CM | POA: Diagnosis not present

## 2018-01-08 DIAGNOSIS — B351 Tinea unguium: Secondary | ICD-10-CM | POA: Diagnosis not present

## 2018-01-08 DIAGNOSIS — N329 Bladder disorder, unspecified: Secondary | ICD-10-CM | POA: Diagnosis present

## 2018-01-08 DIAGNOSIS — R200 Anesthesia of skin: Secondary | ICD-10-CM | POA: Diagnosis present

## 2018-01-08 DIAGNOSIS — D509 Iron deficiency anemia, unspecified: Secondary | ICD-10-CM | POA: Diagnosis present

## 2018-01-08 DIAGNOSIS — I5022 Chronic systolic (congestive) heart failure: Secondary | ICD-10-CM

## 2018-01-08 DIAGNOSIS — R202 Paresthesia of skin: Secondary | ICD-10-CM

## 2018-01-08 DIAGNOSIS — Z87442 Personal history of urinary calculi: Secondary | ICD-10-CM

## 2018-01-08 DIAGNOSIS — L299 Pruritus, unspecified: Secondary | ICD-10-CM | POA: Diagnosis not present

## 2018-01-08 DIAGNOSIS — Z888 Allergy status to other drugs, medicaments and biological substances status: Secondary | ICD-10-CM

## 2018-01-08 DIAGNOSIS — J309 Allergic rhinitis, unspecified: Secondary | ICD-10-CM | POA: Diagnosis present

## 2018-01-08 DIAGNOSIS — Z79899 Other long term (current) drug therapy: Secondary | ICD-10-CM

## 2018-01-08 DIAGNOSIS — G4733 Obstructive sleep apnea (adult) (pediatric): Secondary | ICD-10-CM | POA: Diagnosis not present

## 2018-01-08 DIAGNOSIS — I481 Persistent atrial fibrillation: Secondary | ICD-10-CM | POA: Diagnosis present

## 2018-01-08 DIAGNOSIS — R509 Fever, unspecified: Secondary | ICD-10-CM

## 2018-01-08 DIAGNOSIS — B3789 Other sites of candidiasis: Secondary | ICD-10-CM | POA: Diagnosis not present

## 2018-01-08 DIAGNOSIS — I639 Cerebral infarction, unspecified: Secondary | ICD-10-CM

## 2018-01-08 DIAGNOSIS — I444 Left anterior fascicular block: Secondary | ICD-10-CM

## 2018-01-08 DIAGNOSIS — E118 Type 2 diabetes mellitus with unspecified complications: Secondary | ICD-10-CM

## 2018-01-08 DIAGNOSIS — I5042 Chronic combined systolic (congestive) and diastolic (congestive) heart failure: Secondary | ICD-10-CM | POA: Diagnosis present

## 2018-01-08 DIAGNOSIS — N179 Acute kidney failure, unspecified: Secondary | ICD-10-CM | POA: Diagnosis not present

## 2018-01-08 DIAGNOSIS — R32 Unspecified urinary incontinence: Secondary | ICD-10-CM | POA: Diagnosis not present

## 2018-01-08 DIAGNOSIS — D72829 Elevated white blood cell count, unspecified: Secondary | ICD-10-CM

## 2018-01-08 DIAGNOSIS — E1142 Type 2 diabetes mellitus with diabetic polyneuropathy: Secondary | ICD-10-CM | POA: Diagnosis not present

## 2018-01-08 DIAGNOSIS — F329 Major depressive disorder, single episode, unspecified: Secondary | ICD-10-CM | POA: Diagnosis not present

## 2018-01-08 DIAGNOSIS — R112 Nausea with vomiting, unspecified: Secondary | ICD-10-CM

## 2018-01-08 DIAGNOSIS — E785 Hyperlipidemia, unspecified: Secondary | ICD-10-CM | POA: Diagnosis present

## 2018-01-08 DIAGNOSIS — E1165 Type 2 diabetes mellitus with hyperglycemia: Secondary | ICD-10-CM | POA: Diagnosis not present

## 2018-01-08 DIAGNOSIS — Z794 Long term (current) use of insulin: Secondary | ICD-10-CM

## 2018-01-08 DIAGNOSIS — N400 Enlarged prostate without lower urinary tract symptoms: Secondary | ICD-10-CM | POA: Diagnosis not present

## 2018-01-08 DIAGNOSIS — Z9079 Acquired absence of other genital organ(s): Secondary | ICD-10-CM

## 2018-01-08 DIAGNOSIS — Z833 Family history of diabetes mellitus: Secondary | ICD-10-CM

## 2018-01-08 DIAGNOSIS — L988 Other specified disorders of the skin and subcutaneous tissue: Secondary | ICD-10-CM

## 2018-01-08 DIAGNOSIS — J069 Acute upper respiratory infection, unspecified: Secondary | ICD-10-CM | POA: Diagnosis present

## 2018-01-08 DIAGNOSIS — Z8546 Personal history of malignant neoplasm of prostate: Secondary | ICD-10-CM

## 2018-01-08 DIAGNOSIS — Z803 Family history of malignant neoplasm of breast: Secondary | ICD-10-CM

## 2018-01-08 HISTORY — DX: Anesthesia of skin: R20.0

## 2018-01-08 LAB — CBC WITH DIFFERENTIAL/PLATELET
Abs Immature Granulocytes: 0.1 10*3/uL (ref 0.0–0.1)
Basophils Absolute: 0.1 10*3/uL (ref 0.0–0.1)
Basophils Relative: 0 %
EOS PCT: 0 %
Eosinophils Absolute: 0 10*3/uL (ref 0.0–0.7)
HEMATOCRIT: 39 % (ref 39.0–52.0)
HEMOGLOBIN: 11.7 g/dL — AB (ref 13.0–17.0)
Immature Granulocytes: 1 %
LYMPHS ABS: 1.7 10*3/uL (ref 0.7–4.0)
LYMPHS PCT: 10 %
MCH: 22.9 pg — AB (ref 26.0–34.0)
MCHC: 30 g/dL (ref 30.0–36.0)
MCV: 76.2 fL — AB (ref 78.0–100.0)
MONO ABS: 1.7 10*3/uL — AB (ref 0.1–1.0)
Monocytes Relative: 10 %
Neutro Abs: 14 10*3/uL — ABNORMAL HIGH (ref 1.7–7.7)
Neutrophils Relative %: 79 %
Platelets: 168 10*3/uL (ref 150–400)
RBC: 5.12 MIL/uL (ref 4.22–5.81)
RDW: 17.3 % — ABNORMAL HIGH (ref 11.5–15.5)
WBC: 17.5 10*3/uL — ABNORMAL HIGH (ref 4.0–10.5)

## 2018-01-08 LAB — COMPREHENSIVE METABOLIC PANEL
ALBUMIN: 3 g/dL — AB (ref 3.5–5.0)
ALT: 16 U/L (ref 0–44)
AST: 18 U/L (ref 15–41)
Alkaline Phosphatase: 91 U/L (ref 38–126)
Anion gap: 9 (ref 5–15)
BUN: 6 mg/dL (ref 6–20)
CHLORIDE: 99 mmol/L (ref 98–111)
CO2: 26 mmol/L (ref 22–32)
Calcium: 8.4 mg/dL — ABNORMAL LOW (ref 8.9–10.3)
Creatinine, Ser: 1.48 mg/dL — ABNORMAL HIGH (ref 0.61–1.24)
GFR calc Af Amer: 58 mL/min — ABNORMAL LOW (ref >60)
GFR calc non Af Amer: 50 mL/min — ABNORMAL LOW (ref >60)
GLUCOSE: 412 mg/dL — AB (ref 70–99)
POTASSIUM: 3.6 mmol/L (ref 3.5–5.1)
Sodium: 134 mmol/L — ABNORMAL LOW (ref 135–145)
Total Bilirubin: 1.7 mg/dL — ABNORMAL HIGH (ref 0.3–1.2)
Total Protein: 7.3 g/dL (ref 6.5–8.1)

## 2018-01-08 LAB — URINALYSIS, ROUTINE W REFLEX MICROSCOPIC
BACTERIA UA: NONE SEEN
BILIRUBIN URINE: NEGATIVE
Glucose, UA: >=500 mg/dL — AB
Ketones, ur: NEGATIVE mg/dL
Leukocytes, UA: NEGATIVE
Nitrite: NEGATIVE
Protein, ur: 100 mg/dL — AB
SPECIFIC GRAVITY, URINE: 1.024 (ref 1.005–1.030)
pH: 6 (ref 5.0–8.0)

## 2018-01-08 LAB — GLUCOSE, CAPILLARY: Glucose-Capillary: 260 mg/dL — ABNORMAL HIGH (ref 70–99)

## 2018-01-08 LAB — I-STAT CG4 LACTIC ACID, ED
LACTIC ACID, VENOUS: 3.18 mmol/L — AB (ref 0.5–1.9)
LACTIC ACID, VENOUS: 1.59 mmol/L (ref 0.5–1.9)

## 2018-01-08 LAB — CK: CK TOTAL: 160 U/L (ref 49–397)

## 2018-01-08 LAB — TROPONIN I: Troponin I: 0.14 ng/mL (ref <0.03)

## 2018-01-08 LAB — CBG MONITORING, ED: Glucose-Capillary: 238 mg/dL — ABNORMAL HIGH (ref 70–99)

## 2018-01-08 MED ORDER — VANCOMYCIN HCL 10 G IV SOLR
2500.0000 mg | Freq: Once | INTRAVENOUS | Status: AC
  Administered 2018-01-08: 2500 mg via INTRAVENOUS
  Filled 2018-01-08: qty 2500

## 2018-01-08 MED ORDER — ATORVASTATIN CALCIUM 40 MG PO TABS
40.0000 mg | ORAL_TABLET | Freq: Every day | ORAL | Status: DC
  Administered 2018-01-08 – 2018-01-11 (×4): 40 mg via ORAL
  Filled 2018-01-08 (×4): qty 1

## 2018-01-08 MED ORDER — INSULIN GLARGINE 100 UNIT/ML ~~LOC~~ SOLN
10.0000 [IU] | Freq: Every day | SUBCUTANEOUS | Status: DC
  Administered 2018-01-08: 10 [IU] via SUBCUTANEOUS
  Filled 2018-01-08 (×2): qty 0.1

## 2018-01-08 MED ORDER — SODIUM CHLORIDE 0.9 % IV SOLN
2.0000 g | Freq: Once | INTRAVENOUS | Status: AC
  Administered 2018-01-08: 2 g via INTRAVENOUS
  Filled 2018-01-08: qty 2

## 2018-01-08 MED ORDER — AMIODARONE HCL 200 MG PO TABS
200.0000 mg | ORAL_TABLET | Freq: Every day | ORAL | Status: DC
  Administered 2018-01-08 – 2018-01-11 (×4): 200 mg via ORAL
  Filled 2018-01-08 (×4): qty 1

## 2018-01-08 MED ORDER — ACETAMINOPHEN 325 MG PO TABS
650.0000 mg | ORAL_TABLET | Freq: Once | ORAL | Status: AC
  Administered 2018-01-08: 650 mg via ORAL
  Filled 2018-01-08: qty 2

## 2018-01-08 MED ORDER — METRONIDAZOLE IN NACL 5-0.79 MG/ML-% IV SOLN
500.0000 mg | Freq: Three times a day (TID) | INTRAVENOUS | Status: DC
  Administered 2018-01-08: 500 mg via INTRAVENOUS
  Filled 2018-01-08 (×3): qty 100

## 2018-01-08 MED ORDER — ACETAMINOPHEN 500 MG PO TABS
1000.0000 mg | ORAL_TABLET | Freq: Four times a day (QID) | ORAL | Status: DC | PRN
  Administered 2018-01-10: 1000 mg via ORAL
  Filled 2018-01-08: qty 2

## 2018-01-08 MED ORDER — KETOROLAC TROMETHAMINE 30 MG/ML IJ SOLN
15.0000 mg | Freq: Once | INTRAMUSCULAR | Status: AC
  Administered 2018-01-08: 15 mg via INTRAVENOUS
  Filled 2018-01-08: qty 1

## 2018-01-08 MED ORDER — RIVAROXABAN 20 MG PO TABS
20.0000 mg | ORAL_TABLET | Freq: Every day | ORAL | Status: DC
  Administered 2018-01-08 – 2018-01-11 (×4): 20 mg via ORAL
  Filled 2018-01-08 (×4): qty 1

## 2018-01-08 MED ORDER — VANCOMYCIN HCL IN DEXTROSE 1-5 GM/200ML-% IV SOLN
1000.0000 mg | Freq: Two times a day (BID) | INTRAVENOUS | Status: DC
  Filled 2018-01-08: qty 200

## 2018-01-08 MED ORDER — SODIUM CHLORIDE 0.9 % IV BOLUS
1000.0000 mL | Freq: Once | INTRAVENOUS | Status: AC
  Administered 2018-01-08: 1000 mL via INTRAVENOUS

## 2018-01-08 MED ORDER — CARVEDILOL 12.5 MG PO TABS: 12.5000 mg | ORAL_TABLET | Freq: Two times a day (BID) | ORAL | Status: DC

## 2018-01-08 MED ORDER — ACETAMINOPHEN 325 MG PO TABS
650.0000 mg | ORAL_TABLET | Freq: Once | ORAL | Status: AC | PRN
  Administered 2018-01-08: 650 mg via ORAL
  Filled 2018-01-08: qty 2

## 2018-01-08 MED ORDER — DULOXETINE HCL 60 MG PO CPEP
60.0000 mg | ORAL_CAPSULE | Freq: Every day | ORAL | Status: DC
  Administered 2018-01-08 – 2018-01-11 (×4): 60 mg via ORAL
  Filled 2018-01-08 (×4): qty 1

## 2018-01-08 MED ORDER — SODIUM CHLORIDE 0.9 % IV SOLN
2.0000 g | Freq: Three times a day (TID) | INTRAVENOUS | Status: DC
  Filled 2018-01-08 (×2): qty 2

## 2018-01-08 MED ORDER — ISOSORBIDE MONONITRATE ER 30 MG PO TB24: 30.0000 mg | ORAL_TABLET | Freq: Every day | ORAL | Status: DC

## 2018-01-08 MED ORDER — VANCOMYCIN HCL IN DEXTROSE 1-5 GM/200ML-% IV SOLN: 1000.0000 mg | Freq: Once | INTRAVENOUS | Status: DC

## 2018-01-08 MED ORDER — GABAPENTIN 300 MG PO CAPS
300.0000 mg | ORAL_CAPSULE | Freq: Every day | ORAL | Status: DC
  Administered 2018-01-08 – 2018-01-10 (×3): 300 mg via ORAL
  Filled 2018-01-08 (×3): qty 1

## 2018-01-08 NOTE — ED Notes (Signed)
Repeat troponin drawn per phlebotomy

## 2018-01-08 NOTE — Significant Event (Signed)
CRITICAL VALUE ALERT  Critical Value:  Troponin 0.014  Date & Time Notied:  01/08/18 0735   Provider Notified:   Orders Received/Actions taken: RN Annia Belt) calling MD

## 2018-01-08 NOTE — Progress Notes (Signed)
Pharmacy Antibiotic Note  Terry Parks is a 62 y.o. male admitted on 01/08/2018 with sepsis of unknown origin. Pharmacy has been consulted for vancomycin and cefepime dosing. WBC 17.5, temp 100.9. Scr 1.48 (BL ~1.1-1.2), estimated CrCl ~75 mL/min.  Plan: Vancomycin 2500mg  IV x1, followed by 1000mg  IV q12h Cefepime 2g IV q8h F/u clinical status, infectious w/u, C&S, renal function, de-escalation, LOT, vancomycin levels as appropriate  Temp (24hrs), Avg:100.8 F (38.2 C), Min:98.9 F (37.2 C), Max:102.6 F (39.2 C)  Recent Labs  Lab 01/08/18 0934 01/08/18 0949 01/08/18 1203  WBC 17.5*  --   --   CREATININE 1.48*  --   --   LATICACIDVEN  --  3.18* 1.59    Estimated Creatinine Clearance: 75.7 mL/min (A) (by C-G formula based on SCr of 1.48 mg/dL (H)).    Allergies  Allergen Reactions  . Lisinopril Cough   Antimicrobials this admission: Vancomycin 8/25 >> Cefepime 8/25 >>  Microbiology results: 8/25 BCx: pending  Thank you for allowing pharmacy to be a part of this patient's care.  Mila Merry Gerarda Fraction, PharmD PGY2 Infectious Diseases Pharmacy Resident Phone: [phone] 01/08/2018 3:54 PM

## 2018-01-08 NOTE — ED Notes (Signed)
Report given to Genia Hotter on [address]

## 2018-01-08 NOTE — Consult Note (Signed)
NEURO HOSPITALIST CONSULT NOTE   Requesting physician: Dr. Vanita Panda  Reason for Consult: Left sided sensory numbness  History obtained from:  Patient and Chart    HPI:                                                                                                                                          Terry Parks is an 62 y.o. male who presented to the ED with chief complaints of chills and left sided sensory numbness. He states that the numbness involved his LUE circumferentially from the shoulder down and was worse in the digits. Denies a strip of sensory loss such as with a dermatomal distribution. No weakness but did feel that he was incoordinated on the left. Also had left foot and ankle numbness, non-dermatomal. Symptoms began about 26 hours ago per initial interview by EDP. He had a fever on arrival to the ED and endorsed a cough. He denied other neurological symptoms, including confusion, speech change, vision change or right sided symptoms.   CT head was normal. I have reviewed the images.    The patient has atrial fibrillation and is on Xarelto.   Other stroke risk factors include CHF, OSA, DM2, HLD, HTN, cardiomyopathy with diffuse hypokinesis noted on prior echocardiogram.  Past Medical History:  Diagnosis Date  . Abscessed tooth    top back large cavity no pain or drainage, one on bottom  pt pulled tooth 4-5 months ago, right top large hole in tooth  . AKI (acute kidney injury) ([place])   . Allergic rhinitis   . Atrial fibrillation ([place])   . BPH (benign prostatic hypertrophy)    Massive BPH noted on cystoscopy 1/23/ 2012 by Dr. Risa Grill.  . Cardiomyopathy ([place])   . CHF (congestive heart failure) ([place])   . Cough 03/30/2012  . Depression   . Diabetes mellitus 04/08/2008   type 2  . Foley catheter in place 07-05-17 placed  . Headache(784.0)    hx migraines none recent  . Hyperlipemia   . Hypertension   . Hypertensive cardiopathy 03/01/2006   2-D  echocardiogram 02/01/2012 showed moderate LVH, mildly to moderately reduced left ventricular systolic function with an estimated ejection fraction of 40-45%, and diffuse hypokinesis.  A nuclear medicine stress study done 01/31/2012 showed no reversible ischemia, a small mid anterior wall fixed defect/infarct, and ejection fraction 42%.      . Neck pain   . Obstructive sleep apnea 03/06/2008   Sleep study 03/06/08 showed severe OSA/hypopnea syndrome, with successful CPAP titration to 13 CWP using a medium ResMed Mirage Quattro full face mask with heated humidifier.   . Rash 04/17/2014  . Renal calculus 05/29/2010   CT scan of abdomen/pelvis on 05/29/2010 showed an obstructing approximate 1-2 mm calculus at the left  UVJ, and an approximate 1-2 mm left lower pole renal calculus.   Patient had continuing severe pain , and an elevation of his serum creatinine to a value of 1.75 on 06/06/2010.  The stone had apparently passed and was not seen on repeat CT 06/08/2010.  [name] Tooth pain 10/29/2017  . Urinary straining 11/02/2016    Past Surgical History:  Procedure Laterality Date  . big toe nails removed Bilateral 20 yrs ago  . CARDIOVERSION N/A 06/08/2017   Procedure: CARDIOVERSION;  Surgeon: Josue Hector, MD;  Location: [facility] ENDOSCOPY;  Service: Cardiovascular;  Laterality: N/A;  . CYSTOSCOPY W/ RETROGRADES    . LYMPHADENECTOMY Bilateral 10/06/2017   Procedure: LYMPHADENECTOMY;  Surgeon: Lucas Mallow, MD;  Location: [facility] ORS;  Service: Urology;  Laterality: Bilateral;  . ROBOT ASSISTED LAPAROSCOPIC RADICAL PROSTATECTOMY N/A 10/06/2017   Procedure: XI ROBOTIC ASSISTED LAPAROSCOPIC RADICAL PROSTATECTOMY;  Surgeon: Lucas Mallow, MD;  Location: [facility] ORS;  Service: Urology;  Laterality: N/A;  . TRANSURETHRAL RESECTION OF BLADDER TUMOR N/A 07/13/2017   Procedure: TRANSURETHRAL RESECTION OF PROSTATE;  Surgeon: Lucas Mallow, MD;  Location: [facility] ORS;  Service: Urology;  Laterality: N/A;    Family History    Problem Relation Age of Onset  . Breast cancer Mother   . Hypertension Father   . Diabetes Maternal Grandmother   . Colon cancer Neg Hx   . Prostate cancer Neg Hx   . Heart attack Neg Hx               Social History:  reports that he has never smoked. He has never used smokeless tobacco. He reports that he does not drink alcohol or use drugs.  Allergies  Allergen Reactions  . Lisinopril Cough    HOME MEDICATIONS:                                                                                                                        ROS:                                                                                                                                       As per HPI.    Blood pressure (!) 163/112, pulse (!) 105, temperature (!) 102.4 F (39.1 C), temperature source Rectal, resp. rate 20, SpO2 95 %.   General Examination:  Physical Exam  HEENT-  [place]/AT    Lungs- Respirations unlabored Extremities- Mild pretibial edema. Onychomycosis bilaterally.   Neurological Examination Mental Status: Alert, fully oriented, thought content appropriate, no delayed responses to questions and commands. Speech fluent with intact comprehension.   Cranial Nerves: II: Visual fields intact with no extinction to DSS. PERRL.   III,IV, VI: Mild palpebral fissure asymmetry. EOMI without nystagmus; mild saccadic quality to pursuits noted.  V,VII: Facial temp sensation decreased on the RIGHT. No facial droop.  VIII: Hearing intact to voice.  IX,X: No hypophonia.  XI: No asymmetry noted.  XII: midline tongue extension Motor: Right : Upper extremity   5/5    Left:     Upper extremity   5/5  Lower extremity   5/5     Lower extremity   5/5 Pincer grasp 5/5 bilaterally No pronator drift.  "Orbiting digits" test symmetric Sensory: Decreased temp sensation LLE. Normal temp sensation LUE, RUE  and RLE. Fine touch intact x 4 without extinction.  Deep Tendon Reflexes: 2+ and symmetric throughout except for absent achilles bilaterally.  Plantars: Right: downgoing   Left: downgoing Cerebellar: No ataxia with FNF bilaterally.  Gait: Deferred.   Lab Results: Basic Metabolic Panel: Recent Labs  Lab 01/08/18 0934  NA 134*  K 3.6  CL 99  CO2 26  GLUCOSE 412*  BUN 6  CREATININE 1.48*  CALCIUM 8.4*    CBC: Recent Labs  Lab 01/08/18 0934  WBC 17.5*  NEUTROABS 14.0*  HGB 11.7*  HCT 39.0  MCV 76.2*  PLT 168    Cardiac Enzymes: No results for input(s): CKTOTAL, CKMB, CKMBINDEX, TROPONINI in the last 168 hours.  Lipid Panel: No results for input(s): CHOL, TRIG, HDL, CHOLHDL, VLDL, LDLCALC in the last 168 hours.  Imaging: Dg Chest 2 View  Result Date: 01/08/2018 CLINICAL DATA:  Fever, left-sided numbness EXAM: CHEST - 2 VIEW COMPARISON:  06/06/2017 chest radiograph. FINDINGS: Stable cardiomediastinal silhouette with mild cardiomegaly. No pneumothorax. No pleural effusion. Lungs appear clear, with no acute consolidative airspace disease and no pulmonary edema. IMPRESSION: Stable mild cardiomegaly without overt pulmonary edema. No active pulmonary disease. Electronically Signed   By: Ilona Sorrel M.D.   On: 01/08/2018 10:00   Ct Head Wo Contrast  Result Date: 01/08/2018 CLINICAL DATA:  Left-sided numbness beginning last night. EXAM: CT HEAD WITHOUT CONTRAST TECHNIQUE: Contiguous axial images were obtained from the base of the skull through the vertex without intravenous contrast. COMPARISON:  11/08/2017.  02/21/2007. FINDINGS: Brain: The brain shows a normal appearance without evidence of malformation, atrophy, old or acute small or large vessel infarction, mass lesion, hemorrhage, hydrocephalus or extra-axial collection. Vascular: No hyperdense vessel. No evidence of atherosclerotic calcification. Skull: Normal.  No traumatic finding.  No focal bone lesion. Sinuses/Orbits:  Sinuses are clear. Orbits appear normal. Mastoids are clear. Other: None significant IMPRESSION: Normal head CT Electronically Signed   By: Nelson Chimes M.D.   On: 01/08/2018 11:21    Assessment: 62 year old male with new onset of subjective left upper extremity sensory numbness, left foot/ankle numbness and fever.  1. Mild lateralized sensory deficit on exam. Otherwise, no dysphasia, dysarthria, visual field cut, facial droop, motor weakness or ataxia noted. Symptoms/signs may be due to a small right thalamic ischemic infarction.  2. CT head negative.  3. Stroke risk factors include atrial fibrillation, CHF, OSA, DM2, HLD, HTN, cardiomyopathy with diffuse hypokinesis noted on prior echocardiogram.  Recommendations: 1. MRI brain. If positive for acute stroke, obtain  the remainder of the stroke work up  2. Continue Xarelto. If MRI positive for stroke, consider switching to an alternate anticoagulant.  3. Management of possible infection per primary team.    Electronically signed: Dr. Kerney Elbe 01/08/2018, 4:18 PM

## 2018-01-08 NOTE — H&P (Signed)
Date: 01/08/2018               Patient Name:  Terry Parks MRN: [id number]  DOB: 1955-05-30 Age / Sex: 62 y.o., male   PCP: Terry Mage, MD         Medical Service: Internal Medicine Teaching Service         Attending Physician: Dr. Evette Doffing, Mallie Mussel, *    First Contact: Dr. Donne Hazel Pager: [phone]  Second Contact: Dr. Hetty Ely Pager: [phone]       After Hours (After 5p/  First Contact Pager: [phone]  weekends / holidays): Second Contact Pager: [phone]   Chief Complaint: numbness   History of Present Illness:  Terry Parks is a 63yoM with insulin dependent type 2 DM, HTN, CHF, OSA, and BPH who presents for evaluation of numbness. He was in his usual state of health until yesterday morning when he woke up with arm and leg numbness. He describes it has "pin prick" and involves his entire left hand, all 5 fingers, and extends to his left shoulder. He also endorses numbness of his left foot up to the left knee. It has been constant since yesterday morning. No alleviating or worsening factors. He initially thought he just slept on it wrong but due to it's persistence, he decided to come to the hospital. He denies any history of similar symptoms.  ROS is negative for n/v/d, fevers or chills at home, cough, chest pain, sob, dysarthria, vision changes, recent illness or sick contact, recent travel or vaccination, dysuria or increased frequency.  He does state that he has issues with yeast infections since his radical prostatectomy in May. He has been wearing adult diapers due to urinary incontinence and has trouble keeping the area dry. He has been using Monistat cream with some relief of the itching.   Meds:    Current Meds  Medication Sig  . amiodarone (PACERONE) 200 MG tablet Take 1 tablet (200 mg total) by mouth daily.  [name] atorvastatin (LIPITOR) 40 MG tablet Take 40 mg by mouth daily.  . carvedilol (COREG) 12.5 MG tablet Take 1 tablet (12.5 mg total) by mouth 2 (two) times  daily with a meal.  . DULoxetine (CYMBALTA) 60 MG capsule Take 1 capsule (60 mg total) by mouth daily.  . fluconazole (DIFLUCAN) 200 MG tablet Take 1 tablet (200 mg total) by mouth daily for 7 days. (Patient taking differently: Take 200 mg by mouth daily. 10 day course filled 12/06/17)  . furosemide (LASIX) 40 MG tablet Take 40 mg by mouth 2 (two) times daily.  [name] gabapentin (NEURONTIN) 300 MG capsule Take 1 capsule (300 mg total) by mouth daily.  . insulin glargine (LANTUS) 100 UNIT/ML injection Inject 60 Units into the skin daily.   . isosorbide mononitrate (IMDUR) 30 MG 24 hr tablet Take 1 tablet (30 mg total) by mouth daily.  . rivaroxaban (XARELTO) 20 MG TABS tablet Take 20 mg by mouth daily with breakfast.     Allergies: Allergies as of 01/08/2018 - Review Complete 01/08/2018  Allergen Reaction Noted  . Lisinopril Cough 03/30/2012   Past Medical History:  Diagnosis Date  . Abscessed tooth    top back large cavity no pain or drainage, one on bottom  pt pulled tooth 4-5 months ago, right top large hole in tooth  . AKI (acute kidney injury) ([place])   . Allergic rhinitis   . Atrial fibrillation ([place])   . BPH (benign prostatic hypertrophy)    Massive BPH  noted on cystoscopy 1/23/ 2012 by Dr. Risa Grill.  . Cardiomyopathy ([place])   . CHF (congestive heart failure) ([place])   . Cough 03/30/2012  . Depression   . Diabetes mellitus 04/08/2008   type 2  . Foley catheter in place 07-05-17 placed  . Headache(784.0)    hx migraines none recent  . Hyperlipemia   . Hypertension   . Hypertensive cardiopathy 03/01/2006   2-D echocardiogram 02/01/2012 showed moderate LVH, mildly to moderately reduced left ventricular systolic function with an estimated ejection fraction of 40-45%, and diffuse hypokinesis.  A nuclear medicine stress study done 01/31/2012 showed no reversible ischemia, a small mid anterior wall fixed defect/infarct, and ejection fraction 42%.      . Neck pain   . Obstructive sleep apnea  03/06/2008   Sleep study 03/06/08 showed severe OSA/hypopnea syndrome, with successful CPAP titration to 13 CWP using a medium ResMed Mirage Quattro full face mask with heated humidifier.   . Rash 04/17/2014  . Renal calculus 05/29/2010   CT scan of abdomen/pelvis on 05/29/2010 showed an obstructing approximate 1-2 mm calculus at the left UVJ, and an approximate 1-2 mm left lower pole renal calculus.   Patient had continuing severe pain , and an elevation of his serum creatinine to a value of 1.75 on 06/06/2010.  The stone had apparently passed and was not seen on repeat CT 06/08/2010.  [name] Tooth pain 10/29/2017  . Urinary straining 11/02/2016    Family History: mom breast cancer, dad HTN  Social History: lives in [place] in an apartment by himself. Hasn't worked in 4 years because he's been sick. 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   Review of Systems: A complete ROS was negative except as per HPI.   Physical Exam: Blood pressure 138/89, pulse (!) 106, temperature (!) 100.9 F (38.3 C), temperature source Rectal, resp. rate (!) 21, SpO2 97 %. Vitals:   01/08/18 1600 01/08/18 1700 01/08/18 1730 01/08/18 1800  BP: (!) 173/107 (!) 148/75 138/89   Pulse: (!) 112 (!) 109 (!) 106   Resp: (!) 23 (!) 29 (!) 21   Temp:    (!) 100.9 F (38.3 C)  TempSrc:    Rectal  SpO2: 96% 94% 97%    General: Vital signs reviewed.  Patient is well-developed and well-nourished, in no acute distress and cooperative with exam. Falls asleep during encounter but easily arousable  Head: Normocephalic and atraumatic. Eyes: EOMI, conjunctivae normal, no scleral icterus.  Neck: Supple, trachea midline, normal ROM, no JVD, masses, thyromegaly, or carotid bruit present.  Cardiovascular: tachycardic, regular rhythm, S1 normal, S2 normal, no murmurs, gallops, or rubs. Pulmonary/Chest: Clear to auscultation bilaterally, no wheezes, rales, or rhonchi. Abdominal: Soft, non-tender,  non-distended, BS +, no masses, organomegaly, or guarding present.  GU: condom catheter in place, skin in the groin and perineum is hyperpigmented and thickened with residue from monistat cream. No open wounds or foul odor. Scrotum has mild fluctuance.   Musculoskeletal: No joint deformities, erythema, or stiffness, ROM full and nontender. Extremities: No lower extremity edema bilaterally,  pulses symmetric and intact bilaterally. No cyanosis or clubbing. Neurological: A&O x3, Strength is normal and symmetric bilaterally, maybe slightly diminished on the left arm and leg, cranial nerve II-XII are grossly intact, no focal motor deficit, sensory intact to light touch bilaterally. Normal grip strength bilaterally. 5/5 pincer grasp.  Psychiatric: Normal mood and affect. speech and behavior is normal. Cognition and memory are normal.  Labs: Na 134 K 3.6 CO2 26 Serum Glucose 412 Creatine 1.48 Ca 8.4 Anion gap 9 T. Bili 1.7  Lactate 3.18 --> 1.59 CK 160 UA: negative leukocytes, nitrite. Negative ketones Urine Glucose > 500  EKG: personally reviewed my interpretation is sinus tach with left atrial enlargement and left anterior fascicular block  CXR: personally reviewed my interpretation is stable cardiomegaly. No pleural effusions or consolidations   Head CT: unremarkable  Assessment & Plan by Problem: Active Problems:   Numbness   Fever  Mr. Woo is a 13yoM with insulin dependent type 2 DM, HTN, CHF, OSA, and BPH who presents with left arm/leg numbness, fever (102.6*F) rectally, leukocytosis to 17.5, and exam concerning for celluiltus of the groin.   Numbness: Woke up with arm and leg numbness two days ago. No other symptoms. Distrubution is odd for a stroke. Nor does it fit the typical stocking and glove pattern of peripheral neuropathy. Neuro exam unremarkable. Head CT negative.  - brain MRI - neurology consulted   Fever: Max 102.6*F rectally, leukocytosis to 17.5 with left  shift, initial lactate 3.18, no symptoms of cough, dysuria. Had radical prostatectomy in May. Does have ongoing yeast infection and scrotum looks mildly fluctuant. CXR and UA unremarkable. UCx 3 weeks ago with enterobacter cloace. Started on metronidazole, vanc, and cefepime in the ED. Differential includes UTI vs cellulitis of the groin.  - f/u BCx and UCx - empiric treatment with Bactrim based on previous urine culture sensitives and Ceftriaxone for possible cellulitis.   Microcytic Anemia: Hemoglobin 11.7, MCV 76.2, elevated RDW - ferritin   Diabetes: a1c in May 2019 was 7.2 - novolog SSI - continue home atorvastatin, gabapentin  A. Fib: continue home amiodarone and xarelto  Dispo: Admit patient to Inpatient with expected length of stay greater than 2 midnights.  Signed: Isabelle Course, MD 01/08/2018, 6:22 PM  Pager: [phone]

## 2018-01-08 NOTE — ED Notes (Signed)
Transported to radiology via stretcher per rad tech  

## 2018-01-08 NOTE — ED Notes (Signed)
Transported to CT scan via stretcher per rad tech  

## 2018-01-08 NOTE — Significant Event (Signed)
MD notified of the patient Troponin level of 0.014

## 2018-01-08 NOTE — Progress Notes (Signed)
Patients condom cath came off when he moved to other table.  Tried to reach 3W, no answer on ext [phone].

## 2018-01-08 NOTE — ED Provider Notes (Addendum)
[facility] DEPARTMENT Provider Note   CSN: [id number] Arrival date & time: 01/08/18  1017     History   Chief Complaint No chief complaint on file.   HPI Terry Parks is a 62 y.o. male.  HPI Patient presents with concern of numbness. Onset was yesterday, about 26 hours ago, possibly longer.  When he notes the prior to this he was in his usual state of health. However, he gradually became aware of numbness throughout the left side of his body, upper and lower extremity, without complete weakness, but with associated discoordination. No speech difficulty, no confusion, dissertation, vision loss. Since onset no clear alleviating or exacerbating factors. Patient is unaware of fever, though after the initial evaluation here it is clear that he does have temperature greater than 100. He has ongoing cough. He states that he is generally well, though acknowledges multiple medical issues including prostate surgery within the past year.  Past Medical History:  Diagnosis Date  . Abscessed tooth    top back large cavity no pain or drainage, one on bottom  pt pulled tooth 4-5 months ago, right top large hole in tooth  . AKI (acute kidney injury) ([place])   . Allergic rhinitis   . Atrial fibrillation ([place])   . BPH (benign prostatic hypertrophy)    Massive BPH noted on cystoscopy 1/23/ 2012 by Dr. Risa Grill.  . Cardiomyopathy ([place])   . CHF (congestive heart failure) ([place])   . Cough 03/30/2012  . Depression   . Diabetes mellitus 04/08/2008   type 2  . Foley catheter in place 07-05-17 placed  . Headache(784.0)    hx migraines none recent  . Hyperlipemia   . Hypertension   . Hypertensive cardiopathy 03/01/2006   2-D echocardiogram 02/01/2012 showed moderate LVH, mildly to moderately reduced left ventricular systolic function with an estimated ejection fraction of 40-45%, and diffuse hypokinesis.  A nuclear medicine stress study done 01/31/2012 showed no  reversible ischemia, a small mid anterior wall fixed defect/infarct, and ejection fraction 42%.      . Neck pain   . Obstructive sleep apnea 03/06/2008   Sleep study 03/06/08 showed severe OSA/hypopnea syndrome, with successful CPAP titration to 13 CWP using a medium ResMed Mirage Quattro full face mask with heated humidifier.   . Rash 04/17/2014  . Renal calculus 05/29/2010   CT scan of abdomen/pelvis on 05/29/2010 showed an obstructing approximate 1-2 mm calculus at the left UVJ, and an approximate 1-2 mm left lower pole renal calculus.   Patient had continuing severe pain , and an elevation of his serum creatinine to a value of 1.75 on 06/06/2010.  The stone had apparently passed and was not seen on repeat CT 06/08/2010.  [name] Tooth pain 10/29/2017  . Urinary straining 11/02/2016    Patient Active Problem List   Diagnosis Date Noted  . Stress incontinence of urine 01/06/2018  . Candidiasis of perineum 01/05/2018  . Pain due to dental caries 11/09/2017  . Prostate cancer ([place]) 10/06/2017  . BPH (benign prostatic hyperplasia) 07/13/2017  . Bladder mass 07/13/2017  . Persistent atrial fibrillation ([place])   . Chest pain 11/02/2016  . Vitamin D deficiency 06/22/2016  . Neuropathy in diabetes ([place]) 10/07/2015  . Erectile dysfunction 04/17/2014  . Allergic rhinitis 10/19/2012  . Diabetic retinopathy ([place]) 10/04/2012  . BPH   . Nephrolithiasis 05/29/2010  . Type 2 diabetes mellitus with peripheral neuropathy ([place]) 04/08/2008  . Obstructive sleep apnea   . Morbid obesity ([place])   .  Hyperlipidemia associated with type 2 diabetes mellitus ([place])   . Essential hypertension   . Chronic combined systolic and diastolic CHF (congestive heart failure) ([place]) 03/01/2006    Past Surgical History:  Procedure Laterality Date  . big toe nails removed Bilateral 20 yrs ago  . CARDIOVERSION N/A 06/08/2017   Procedure: CARDIOVERSION;  Surgeon: Josue Hector, MD;  Location: [facility] ENDOSCOPY;  Service: Cardiovascular;   Laterality: N/A;  . CYSTOSCOPY W/ RETROGRADES    . LYMPHADENECTOMY Bilateral 10/06/2017   Procedure: LYMPHADENECTOMY;  Surgeon: Lucas Mallow, MD;  Location: [facility] ORS;  Service: Urology;  Laterality: Bilateral;  . ROBOT ASSISTED LAPAROSCOPIC RADICAL PROSTATECTOMY N/A 10/06/2017   Procedure: XI ROBOTIC ASSISTED LAPAROSCOPIC RADICAL PROSTATECTOMY;  Surgeon: Lucas Mallow, MD;  Location: [facility] ORS;  Service: Urology;  Laterality: N/A;  . TRANSURETHRAL RESECTION OF BLADDER TUMOR N/A 07/13/2017   Procedure: TRANSURETHRAL RESECTION OF PROSTATE;  Surgeon: Lucas Mallow, MD;  Location: [facility] ORS;  Service: Urology;  Laterality: N/A;        Home Medications    Prior to Admission medications   Medication Sig Start Date End Date Taking? Authorizing Provider  amiodarone (PACERONE) 200 MG tablet Take 1 tablet (200 mg total) by mouth daily. 10/14/17 01/08/18 Yes [name], Vahini, MD  atorvastatin (LIPITOR) 40 MG tablet Take 40 mg by mouth daily. 10/14/17  Yes [provider]  carvedilol (COREG) 12.5 MG tablet Take 1 tablet (12.5 mg total) by mouth 2 (two) times daily with a meal. 10/14/17 01/08/18 Yes [name], Vahini, MD  fluconazole (DIFLUCAN) 200 MG tablet Take 1 tablet (200 mg total) by mouth daily for 7 days. 01/06/18 01/13/18 Yes [name], Vahini, MD  amLODipine-atorvastatin (CADUET) 5-40 MG tablet Take 1 tablet by mouth daily. Patient not taking: Reported on 12/20/2017 10/14/17   [name], MD  cephALEXin (KEFLEX) 500 MG capsule Take 1 capsule (500 mg total) by mouth 4 (four) times daily. Patient not taking: Reported on 01/08/2018 12/20/17   Nat Christen, MD  chlorhexidine gluconate, MEDLINE KIT, (PERIDEX) 0.12 % solution Use as directed 15 mLs in the mouth or throat 2 (two) times daily. Swish and spit.  Do not swallow. Patient not taking: Reported on 12/20/2017 11/08/17   Langston Masker B, PA-C  DULoxetine (CYMBALTA) 60 MG capsule Take 1 capsule (60 mg total) by mouth daily. 10/14/17 10/14/18  [name], MD  Empagliflozin-metFORMIN HCl ER (SYNJARDY XR) 09-998 MG TB24 Take 2 tablets by mouth daily. Patient not taking: Reported on 12/20/2017 08/25/17   [name], MD  furosemide (LASIX) 40 MG tablet Take 1 tablet (40 mg total) by mouth 2 (two) times daily. 10/14/17 11/13/17  [name], MD  gabapentin (NEURONTIN) 300 MG capsule Take 1 capsule (300 mg total) by mouth daily. 10/14/17 11/13/17  [name], [name], MD  glucose blood (TRUETRACK TEST) test strip Use as directed to test blood sugar three times a day before meals. 10/04/12   Bertha Stakes, MD  Insulin Glargine (BASAGLAR KWIKPEN) 100 UNIT/ML SOPN Inject 0.6 mLs (60 Units total) into the skin daily. Patient not taking: Reported on 11/08/2017 10/14/17   [name], MD  insulin glargine (LANTUS) 100 UNIT/ML injection Inject 60 Units into the skin at bedtime.    [provider]  Insulin Pen Needle (CARETOUCH PEN NEEDLES) 31G X 6 MM MISC 1 pen by Does not apply route at bedtime. 03/01/17   [name], [name], MD  isosorbide mononitrate (IMDUR) 30 MG 24 hr tablet Take 1 tablet (30 mg  total) by mouth daily. Patient taking differently: Take 30 mg by mouth every evening.  07/06/17   Forde Dandy, PharmD  Lancets MISC Use to test blood sugar three times a day before meals. 09/14/13   Malena Catholic, MD  lidocaine (XYLOCAINE) 2 % solution Use as directed 15 mLs in the mouth or throat as needed for mouth pain. Swish and spit.  Do not swallow. Patient not taking: Reported on 12/20/2017 11/08/17   Langston Masker B, PA-C  losartan (COZAAR) 50 MG tablet Take 1 tablet (50 mg total) by mouth daily. 10/14/17   [name], [name], MD  metFORMIN (GLUCOPHAGE-XR) 500 MG 24 hr tablet Take 500 mg by mouth daily with breakfast.    [provider]  Misc. Devices MISC by Does not apply route. C-PAP    [provider]  XARELTO 20 MG TABS tablet Take 20 mg by mouth daily. 10/14/17   [provider]    Family History Family History   Problem Relation Age of Onset  . Breast cancer Mother   . Hypertension Father   . Diabetes Maternal Grandmother   . Colon cancer Neg Hx   . Prostate cancer Neg Hx   . Heart attack Neg Hx     Social History Social History   Tobacco Use  . Smoking status: Never Smoker  . Smokeless tobacco: Never Used  Substance Use Topics  . Alcohol use: No    Alcohol/week: 0.0 standard drinks  . Drug use: No     Allergies   Lisinopril   Review of Systems Review of Systems  Constitutional:       Per HPI, otherwise negative  HENT:       Per HPI, otherwise negative  Respiratory:       Per HPI, otherwise negative  Cardiovascular:       Per HPI, otherwise negative  Gastrointestinal: Negative for vomiting.  Endocrine:       Negative aside from HPI  Genitourinary:       Neg aside from HPI   Musculoskeletal:       Per HPI, otherwise negative  Skin: Negative.   Neurological: Positive for numbness. Negative for syncope.     Physical Exam Updated Vital Signs BP (!) 163/112   Pulse (!) 105   Temp (!) 100.9 F (38.3 C) (Rectal)   Resp 20   SpO2 95%   Physical Exam  Constitutional: He is oriented to person, place, and time. He appears well-developed. No distress.  HENT:  Head: Normocephalic and atraumatic.  Eyes: Conjunctivae and EOM are normal.  Cardiovascular: Normal rate and regular rhythm.  Pulmonary/Chest: Effort normal. No stridor. No respiratory distress.  Abdominal: He exhibits no distension.  Musculoskeletal: He exhibits no edema.  Neurological: He is alert and oriented to person, place, and time. He displays no atrophy and no tremor. He exhibits normal muscle tone. He displays no seizure activity. Coordination normal.  Patient's neurologic exam grossly unremarkable, though the patient subjectively has diminished sensation in the upper and lower extremity, subjectively has less grip strength on the left.   Skin: Skin is warm and dry.  Psychiatric: He has a normal mood  and affect.  Nursing note and vitals reviewed.    ED Treatments / Results  Labs (all labs ordered are listed, but only abnormal results are displayed) Labs Reviewed  COMPREHENSIVE METABOLIC PANEL - Abnormal; Notable for the following components:      Result Value   Sodium 134 (*)  Glucose, Bld 412 (*)    Creatinine, Ser 1.48 (*)    Calcium 8.4 (*)    Albumin 3.0 (*)    Total Bilirubin 1.7 (*)    GFR calc non Af Amer 50 (*)    GFR calc Af Amer 58 (*)    All other components within normal limits  CBC WITH DIFFERENTIAL/PLATELET - Abnormal; Notable for the following components:   WBC 17.5 (*)    Hemoglobin 11.7 (*)    MCV 76.2 (*)    MCH 22.9 (*)    RDW 17.3 (*)    Neutro Abs 14.0 (*)    Monocytes Absolute 1.7 (*)    All other components within normal limits  URINALYSIS, ROUTINE W REFLEX MICROSCOPIC - Abnormal; Notable for the following components:   Glucose, UA >=500 (*)    Hgb urine dipstick MODERATE (*)    Protein, ur 100 (*)    All other components within normal limits  I-STAT CG4 LACTIC ACID, ED - Abnormal; Notable for the following components:   Lactic Acid, Venous 3.18 (*)    All other components within normal limits  CULTURE, BLOOD (ROUTINE X 2)  CULTURE, BLOOD (ROUTINE X 2)  I-STAT CG4 LACTIC ACID, ED    EKG None  Radiology Dg Chest 2 View  Result Date: 01/08/2018 CLINICAL DATA:  Fever, left-sided numbness EXAM: CHEST - 2 VIEW COMPARISON:  06/06/2017 chest radiograph. FINDINGS: Stable cardiomediastinal silhouette with mild cardiomegaly. No pneumothorax. No pleural effusion. Lungs appear clear, with no acute consolidative airspace disease and no pulmonary edema. IMPRESSION: Stable mild cardiomegaly without overt pulmonary edema. No active pulmonary disease. Electronically Signed   By: Ilona Sorrel M.D.   On: 01/08/2018 10:00   Ct Head Wo Contrast  Result Date: 01/08/2018 CLINICAL DATA:  Left-sided numbness beginning last night. EXAM: CT HEAD WITHOUT CONTRAST  TECHNIQUE: Contiguous axial images were obtained from the base of the skull through the vertex without intravenous contrast. COMPARISON:  11/08/2017.  02/21/2007. FINDINGS: Brain: The brain shows a normal appearance without evidence of malformation, atrophy, old or acute small or large vessel infarction, mass lesion, hemorrhage, hydrocephalus or extra-axial collection. Vascular: No hyperdense vessel. No evidence of atherosclerotic calcification. Skull: Normal.  No traumatic finding.  No focal bone lesion. Sinuses/Orbits: Sinuses are clear. Orbits appear normal. Mastoids are clear. Other: None significant IMPRESSION: Normal head CT Electronically Signed   By: Nelson Chimes M.D.   On: 01/08/2018 11:21    Procedures Procedures (including critical care time)  Medications Ordered in ED Medications  ceFEPIme (MAXIPIME) 2 g in sodium chloride 0.9 % 100 mL IVPB (has no administration in time range)  vancomycin (VANCOCIN) IVPB 1000 mg/200 mL premix (has no administration in time range)  metroNIDAZOLE (FLAGYL) IVPB 500 mg (has no administration in time range)  sodium chloride 0.9 % bolus 1,000 mL (has no administration in time range)  acetaminophen (TYLENOL) tablet 650 mg (650 mg Oral Given 01/08/18 1020)  sodium chloride 0.9 % bolus 1,000 mL (0 mLs Intravenous Stopped 01/08/18 1216)  sodium chloride 0.9 % bolus 1,000 mL (0 mLs Intravenous Stopped 01/08/18 1533)  ketorolac (TORADOL) 30 MG/ML injection 15 mg (15 mg Intravenous Given 01/08/18 1540)     Initial Impression / Assessment and Plan / ED Course  I have reviewed the triage vital signs and the nursing notes.  Pertinent labs & imaging results that were available during my care of the patient were reviewed by me and considered in my medical decision making (see chart  for details).    With concern for lactic acidosis, fever, leukocytosis patient received IV fluids, Tylenol after the initial evaluation.  Update:, Patient remains febrile, after initial  fluids, Tylenol  Update: Patient febrile, after Toradol, and fluids.    3:53 PM Is preparing for remainder of his 30 mL/kg bolus for sepsis (though he remains slightly hypertensive).. No clear source identified, but given persistent fever, leukocytosis, chills patient will receive empiric antibiotics. Patient remains awake and alert, though tachycardic, and hypertensive.  This patient presents with concern of numbness. Patient is neurologically unremarkable, with equal sensation bilaterally, equal strength bilaterally, no facial asymmetry, and initial evaluation is more notable for demonstration of fever, with subsequent labs consistent with possible infection, leukocytosis, mild lactic acidosis, and elevated absolute neutrophil count. Patient received empiric antibiotics, as well as fluid resuscitation, though he was normotensive. Neurology consulted for assistance with his numbness, which may be secondary to infection versus inflammation given his presentation consistent with SIRS.  Given these concerns, patient admitted for further evaluation and management.  Final Clinical Impressions(s) / ED Diagnoses  SIRS Numbness   Carmin Muskrat, MD 01/08/18 1556  CRITICAL CARE Performed by: Carmin Muskrat Total critical care time: 35 minutes Critical care time was exclusive of separately billable procedures and treating other patients. Critical care was necessary to treat or prevent imminent or life-threatening deterioration. Critical care was time spent personally by me on the following activities: development of treatment plan with patient and/or surrogate as well as nursing, discussions with consultants, evaluation of patient's response to treatment, examination of patient, obtaining history from patient or surrogate, ordering and performing treatments and interventions, ordering and review of laboratory studies, ordering and review of radiographic studies, pulse oximetry and  re-evaluation of patient's condition.     Carmin Muskrat, MD 01/24/18 1216

## 2018-01-08 NOTE — ED Notes (Signed)
Patient transported to X-ray 

## 2018-01-08 NOTE — ED Notes (Signed)
Pt reapproached about need for urine sample, given urinal

## 2018-01-08 NOTE — ED Triage Notes (Signed)
Patient complains of chills with left sided numbness since last night. Reports that he went to bed normal at 2100. No facial droop, no grip abnormality. No drift. Speech clear. Fever on arrival, no cough, no congestion. Alert and oriented

## 2018-01-09 ENCOUNTER — Other Ambulatory Visit (HOSPITAL_COMMUNITY): Payer: Self-pay

## 2018-01-09 ENCOUNTER — Inpatient Hospital Stay (HOSPITAL_COMMUNITY): Payer: Medicaid Other

## 2018-01-09 ENCOUNTER — Encounter (HOSPITAL_COMMUNITY): Payer: Self-pay | Admitting: Radiology

## 2018-01-09 DIAGNOSIS — I480 Paroxysmal atrial fibrillation: Secondary | ICD-10-CM

## 2018-01-09 DIAGNOSIS — I509 Heart failure, unspecified: Secondary | ICD-10-CM

## 2018-01-09 DIAGNOSIS — R739 Hyperglycemia, unspecified: Secondary | ICD-10-CM

## 2018-01-09 DIAGNOSIS — Z9114 Patient's other noncompliance with medication regimen: Secondary | ICD-10-CM

## 2018-01-09 DIAGNOSIS — E119 Type 2 diabetes mellitus without complications: Secondary | ICD-10-CM

## 2018-01-09 DIAGNOSIS — R200 Anesthesia of skin: Secondary | ICD-10-CM

## 2018-01-09 DIAGNOSIS — N179 Acute kidney failure, unspecified: Secondary | ICD-10-CM

## 2018-01-09 DIAGNOSIS — R748 Abnormal levels of other serum enzymes: Secondary | ICD-10-CM

## 2018-01-09 DIAGNOSIS — I4891 Unspecified atrial fibrillation: Secondary | ICD-10-CM

## 2018-01-09 DIAGNOSIS — N5089 Other specified disorders of the male genital organs: Secondary | ICD-10-CM

## 2018-01-09 DIAGNOSIS — E1142 Type 2 diabetes mellitus with diabetic polyneuropathy: Principal | ICD-10-CM

## 2018-01-09 LAB — HEMOGLOBIN A1C
Hgb A1c MFr Bld: 9.9 % — ABNORMAL HIGH (ref 4.8–5.6)
MEAN PLASMA GLUCOSE: 237.43 mg/dL

## 2018-01-09 LAB — TROPONIN I
TROPONIN I: 0.14 ng/mL — AB (ref <0.03)
Troponin I: 0.11 ng/mL (ref <0.03)

## 2018-01-09 LAB — BASIC METABOLIC PANEL
ANION GAP: 8 (ref 5–15)
BUN: 10 mg/dL (ref 6–20)
CHLORIDE: 103 mmol/L (ref 98–111)
CO2: 22 mmol/L (ref 22–32)
Calcium: 7.6 mg/dL — ABNORMAL LOW (ref 8.9–10.3)
Creatinine, Ser: 1.46 mg/dL — ABNORMAL HIGH (ref 0.61–1.24)
GFR calc Af Amer: 59 mL/min — ABNORMAL LOW (ref >60)
GFR, EST NON AFRICAN AMERICAN: 51 mL/min — AB (ref >60)
GLUCOSE: 417 mg/dL — AB (ref 70–99)
Potassium: 4 mmol/L (ref 3.5–5.1)
Sodium: 133 mmol/L — ABNORMAL LOW (ref 135–145)

## 2018-01-09 LAB — LIPID PANEL
CHOL/HDL RATIO: 3.9 ratio
Cholesterol: 101 mg/dL (ref 0–200)
HDL: 26 mg/dL — AB (ref >40)
LDL CALC: 64 mg/dL (ref 0–99)
TRIGLYCERIDES: 57 mg/dL (ref <150)
VLDL: 11 mg/dL (ref 0–40)

## 2018-01-09 LAB — CBC
HEMATOCRIT: 33.3 % — AB (ref 39.0–52.0)
HEMOGLOBIN: 10 g/dL — AB (ref 13.0–17.0)
MCH: 22.6 pg — ABNORMAL LOW (ref 26.0–34.0)
MCHC: 30 g/dL (ref 30.0–36.0)
MCV: 75.3 fL — AB (ref 78.0–100.0)
Platelets: 152 10*3/uL (ref 150–400)
RBC: 4.42 MIL/uL (ref 4.22–5.81)
RDW: 17 % — ABNORMAL HIGH (ref 11.5–15.5)
WBC: 15.1 10*3/uL — AB (ref 4.0–10.5)

## 2018-01-09 LAB — HIV ANTIBODY (ROUTINE TESTING W REFLEX): HIV SCREEN 4TH GENERATION: NONREACTIVE

## 2018-01-09 LAB — RAPID URINE DRUG SCREEN, HOSP PERFORMED
Amphetamines: NOT DETECTED
Barbiturates: NOT DETECTED
Benzodiazepines: NOT DETECTED
COCAINE: NOT DETECTED
OPIATES: NOT DETECTED
TETRAHYDROCANNABINOL: NOT DETECTED

## 2018-01-09 LAB — FERRITIN: Ferritin: 118 ng/mL (ref 24–336)

## 2018-01-09 LAB — GLUCOSE, CAPILLARY
GLUCOSE-CAPILLARY: 219 mg/dL — AB (ref 70–99)
Glucose-Capillary: 386 mg/dL — ABNORMAL HIGH (ref 70–99)
Glucose-Capillary: 332 mg/dL — ABNORMAL HIGH (ref 70–99)
Glucose-Capillary: 198 mg/dL — ABNORMAL HIGH (ref 70–99)

## 2018-01-09 LAB — VITAMIN B12: VITAMIN B 12: 219 pg/mL (ref 180–914)

## 2018-01-09 MED ORDER — CARVEDILOL 12.5 MG PO TABS
12.5000 mg | ORAL_TABLET | Freq: Two times a day (BID) | ORAL | Status: DC
  Administered 2018-01-09 – 2018-01-11 (×5): 12.5 mg via ORAL
  Filled 2018-01-09 (×3): qty 1
  Filled 2018-01-09: qty 2
  Filled 2018-01-09: qty 1

## 2018-01-09 MED ORDER — INSULIN GLARGINE 100 UNIT/ML ~~LOC~~ SOLN
20.0000 [IU] | Freq: Every day | SUBCUTANEOUS | Status: DC
  Administered 2018-01-09 – 2018-01-10 (×2): 20 [IU] via SUBCUTANEOUS
  Filled 2018-01-09 (×2): qty 0.2

## 2018-01-09 MED ORDER — ISOSORBIDE MONONITRATE ER 30 MG PO TB24
30.0000 mg | ORAL_TABLET | Freq: Every day | ORAL | Status: DC
  Administered 2018-01-09 – 2018-01-11 (×3): 30 mg via ORAL
  Filled 2018-01-09 (×3): qty 1

## 2018-01-09 MED ORDER — IOPAMIDOL (ISOVUE-370) INJECTION 76%
INTRAVENOUS | Status: AC
  Administered 2018-01-09: 17:00:00
  Filled 2018-01-09: qty 50

## 2018-01-09 MED ORDER — INSULIN ASPART 100 UNIT/ML ~~LOC~~ SOLN
4.0000 [IU] | Freq: Three times a day (TID) | SUBCUTANEOUS | Status: DC
  Administered 2018-01-09 (×2): 4 [IU] via SUBCUTANEOUS

## 2018-01-09 MED ORDER — INSULIN ASPART 100 UNIT/ML ~~LOC~~ SOLN
6.0000 [IU] | Freq: Three times a day (TID) | SUBCUTANEOUS | Status: DC
  Administered 2018-01-09 – 2018-01-11 (×7): 6 [IU] via SUBCUTANEOUS

## 2018-01-09 MED ORDER — INSULIN ASPART 100 UNIT/ML ~~LOC~~ SOLN
0.0000 [IU] | Freq: Three times a day (TID) | SUBCUTANEOUS | Status: DC
  Administered 2018-01-09: 15 [IU] via SUBCUTANEOUS
  Administered 2018-01-09: 3 [IU] via SUBCUTANEOUS
  Administered 2018-01-09: 4 [IU] via SUBCUTANEOUS
  Administered 2018-01-10: 3 [IU] via SUBCUTANEOUS
  Administered 2018-01-10: 8 [IU] via SUBCUTANEOUS
  Administered 2018-01-10 – 2018-01-11 (×2): 3 [IU] via SUBCUTANEOUS
  Administered 2018-01-11: 4 [IU] via SUBCUTANEOUS
  Administered 2018-01-11: 5 [IU] via SUBCUTANEOUS

## 2018-01-09 MED ORDER — INSULIN ASPART 100 UNIT/ML ~~LOC~~ SOLN
0.0000 [IU] | Freq: Every day | SUBCUTANEOUS | Status: DC
  Administered 2018-01-09: 2 [IU] via SUBCUTANEOUS

## 2018-01-09 MED ORDER — IOPAMIDOL (ISOVUE-370) INJECTION 76%
50.0000 mL | Freq: Once | INTRAVENOUS | Status: AC | PRN
  Administered 2018-01-09: 50 mL via INTRAVENOUS

## 2018-01-09 MED ORDER — SULFAMETHOXAZOLE-TRIMETHOPRIM 800-160 MG PO TABS
1.0000 | ORAL_TABLET | Freq: Two times a day (BID) | ORAL | Status: DC
  Administered 2018-01-09: 1 via ORAL
  Filled 2018-01-09: qty 1

## 2018-01-09 MED ORDER — SODIUM CHLORIDE 0.9 % IV SOLN
1.0000 g | INTRAVENOUS | Status: DC
  Administered 2018-01-09 – 2018-01-10 (×2): 1 g via INTRAVENOUS
  Filled 2018-01-09 (×2): qty 10

## 2018-01-09 NOTE — Progress Notes (Addendum)
STROKE TEAM PROGRESS NOTE   INTERVAL HISTORY No family is at the bedside.  He is very lethargic and falls asleep quickly during interview.  Continues to complain of left arm and leg numbness -radiates from his left shoulder to his left fingertips and his left hip to his left toes.  No numbness in face or chest.  Other than that, states he is at baseline and remains sleepy as he stayed up all night.  Vitals:   01/08/18 2317 01/09/18 0012 01/09/18 0346 01/09/18 0822  BP:  (!) 158/96 (!) 168/97 (!) 164/108  Pulse:  (!) 106 (!) 117 (!) 115  Resp:  20 20 18   Temp:  99.3 F (37.4 C) 98 F (36.7 C) 99.4 F (37.4 C)  TempSrc:  Oral Oral Oral  SpO2:  95% 97% 100%  Weight: (!) 137.6 kg     Height:        CBC:  Recent Labs  Lab 01/08/18 0934 01/09/18 0509  WBC 17.5* 15.1*  NEUTROABS 14.0*  --   HGB 11.7* 10.0*  HCT 39.0 33.3*  MCV 76.2* 75.3*  PLT 168 572    Basic Metabolic Panel:  Recent Labs  Lab 01/08/18 0934 01/09/18 0509  NA 134* 133*  K 3.6 4.0  CL 99 103  CO2 26 22  GLUCOSE 412* 417*  BUN 6 10  CREATININE 1.48* 1.46*  CALCIUM 8.4* 7.6*   Lipid Panel:     Component Value Date/Time   CHOL 101 01/09/2018 0509   CHOL 130 08/22/2017 1151   TRIG 57 01/09/2018 0509   HDL 26 (L) 01/09/2018 0509   HDL 39 (L) 08/22/2017 1151   CHOLHDL 3.9 01/09/2018 0509   VLDL 11 01/09/2018 0509   LDLCALC 64 01/09/2018 0509   LDLCALC 75 08/22/2017 1151   HgbA1c:  Lab Results  Component Value Date   HGBA1C 9.9 (H) 01/09/2018   Urine Drug Screen:     Component Value Date/Time   LABOPIA NONE DETECTED 01/31/2012 1105   COCAINSCRNUR NONE DETECTED 01/31/2012 1105   LABBENZ NONE DETECTED 01/31/2012 1105   AMPHETMU NONE DETECTED 01/31/2012 1105   THCU NONE DETECTED 01/31/2012 1105   LABBARB NONE DETECTED 01/31/2012 1105    Alcohol Level No results found for: [facility]  IMAGING Dg Chest 2 View  Result Date: 01/08/2018 CLINICAL DATA:  Fever, left-sided numbness EXAM: CHEST - 2 VIEW  COMPARISON:  06/06/2017 chest radiograph. FINDINGS: Stable cardiomediastinal silhouette with mild cardiomegaly. No pneumothorax. No pleural effusion. Lungs appear clear, with no acute consolidative airspace disease and no pulmonary edema. IMPRESSION: Stable mild cardiomegaly without overt pulmonary edema. No active pulmonary disease. Electronically Signed   By: Ilona Sorrel M.D.   On: 01/08/2018 10:00   Ct Head Wo Contrast  Result Date: 01/08/2018 CLINICAL DATA:  Left-sided numbness beginning last night. EXAM: CT HEAD WITHOUT CONTRAST TECHNIQUE: Contiguous axial images were obtained from the base of the skull through the vertex without intravenous contrast. COMPARISON:  11/08/2017.  02/21/2007. FINDINGS: Brain: The brain shows a normal appearance without evidence of malformation, atrophy, old or acute small or large vessel infarction, mass lesion, hemorrhage, hydrocephalus or extra-axial collection. Vascular: No hyperdense vessel. No evidence of atherosclerotic calcification. Skull: Normal.  No traumatic finding.  No focal bone lesion. Sinuses/Orbits: Sinuses are clear. Orbits appear normal. Mastoids are clear. Other: None significant IMPRESSION: Normal head CT Electronically Signed   By: Nelson Chimes M.D.   On: 01/08/2018 11:21   Mr Brain Wo Contrast  Result Date: 01/08/2018  CLINICAL DATA:  62 y/o M; left upper and lower extremity numbness constant since yesterday morning. EXAM: MRI HEAD WITHOUT CONTRAST TECHNIQUE: Multiplanar, multiecho pulse sequences of the brain and surrounding structures were obtained without intravenous contrast. COMPARISON:  01/08/2018 CT head. FINDINGS: Brain: No acute infarction, hemorrhage, hydrocephalus, extra-axial collection or mass lesion. Few nonspecific T2 FLAIR hyperintensities in predominantly in bilateral parietal periventricular white matter are compatible with mild chronic microvascular ischemic changes for age. Mild volume loss of the brain. Symmetric nonspecific  globus pallidus increased T2 and decreased T1 signal (series 10, image 12 and series 14, image 26). No associated reduced diffusion. Vascular: Normal flow voids. Skull and upper cervical spine: Normal marrow signal. Sinuses/Orbits: Negative. Other: None. IMPRESSION: 1. No acute intracranial abnormality identified. 2. Symmetric nonspecific globus pallidus lesions with increased T2 and decreased T1 signal. Some differential considerations include chronic sequelae of hypoxic ischemic injury, carbon monoxide poisoning, or toxic encephalopathy (notably heroin, MDMA). 3. Mild chronic microvascular ischemic changes and volume loss of the brain. Electronically Signed   By: Kristine Garbe M.D.   On: 01/08/2018 23:34   US Renal  Result Date: 01/08/2018 CLINICAL DATA:  Acute kidney injury EXAM: RENAL / URINARY TRACT ULTRASOUND COMPLETE COMPARISON:  None. FINDINGS: Study is limited by patient body habitus. Portions of the RIGHT kidney is not well seen. LEFT kidney is poorly seen. Right Kidney: Length: 10.2 cm. Echogenicity within normal limits. No mass or hydronephrosis visualized. Left Kidney: Length: 9.4 cm. Echogenicity within normal limits. No mass or hydronephrosis visualized. Bladder: Appears normal for degree of bladder distention. IMPRESSION: No abnormality identified. Study limited by patient body habitus. No hydronephrosis bilaterally. Electronically Signed   By: Franki Cabot M.D.   On: 01/08/2018 23:07   2D echocardiogram Pending  PHYSICAL EXAM HEENT-  [place]/AT    Lungs- Respirations unlabored Extremities- Mild pretibial edema, left greater than right. Onychomycosis bilaterally.   Neurological Examination Mental Status: Alert but easily falls asleep during exam.  When awake he is  fully oriented, thought content appropriate, no delayed responses to questions and commands. Speech fluent with intact comprehension.   Cranial Nerves: II: Visual fields intact with no extinction to DSS. PERRL.    III,IV, VI: Mild palpebral fissure asymmetry. EOMI without nystagmus; mild saccadic quality to pursuits noted.  V,VII: Facial temp sensation decreased on the RIGHT. No facial droop.  VIII: Hearing intact to voice.  IX,X: No hypophonia.  XI: No asymmetry noted.  XII: midline tongue extension Motor: Right :  Upper extremity   5/5                                      Left:     Upper extremity   5/5             Lower extremity   5/5                                                  Lower extremity   5/5 Pincer grasp 5/5 bilaterally No pronator drift.  "Orbiting digits" test symmetric Sensory:  Sensation intact bilaterally Deep Tendon Reflexes: 2+ and symmetric throughout except for absent achilles bilaterally.  Plantars: Right: downgoing  Left: downgoing Cerebellar: No ataxia with FNF bilaterally.  Gait: Deferred.    ASSESSMENT/PLAN [name] is a 62 y.o. male with history of AF on xarelto, HTN, HLD, DB, CHF, migrianes, OSA on CPAP presenting with chills and L sided numbness. Had T 102.6.   Left arm and leg paresthesias, doubt TIA  No neck pain.  No facial involvement of paresthesia   CT head normal  MRI no acute stroke.  Symmetric nonspecific globus pallidus lesions. Small vessel disease. Atrophy.  CTA head & neck pending  2D Echo pending  LDL 64  HgbA1c 9.9  xarelto for VTE prophylaxis  Xarelto (rivaroxaban) daily prior to admission, now on Xarelto (rivaroxaban) daily.  Continue Xarelto   Therapy recommendations: Pending  Disposition: Pending  Hypertension  Elevated 140-170s . BP goal normotensive  Hyperlipidemia  Home meds:  lipitor 40, resumed in hospital  LDL 64, goal < 70  Continue statin at discharge  Diabetes type II  HgbA1c 9.9, goal < 7.0  Uncontrolled  Other Stroke Risk Factors  Morbid Obesity, Body mass index is 40.02 kg/m., recommend weight loss, diet and exercise as appropriate   Obstructive sleep  apnea, on CPAP at home  Migraines  Congestive heart failure  Other Active Problems  Fever, leukocytosis improvement to 15.  On ceftriaxone.  Thought to be cellulitis of scrotum.  Microcytic anemia  Elevated troponin felt to be due to demand ischemia from fever and infection  Hospital day # [place], MSN, APRN, ANVP-BC, AGPCNP-BC Advanced Practice Stroke Nurse [place] for Schedule & Pager information 01/09/2018 10:14 AM   ATTENDING NOTE: I reviewed above note and agree with the assessment and plan. I have made any additions or clarifications directly to the above note. Pt was seen and examined.   62 year old male with history of A. fib on Xarelto, BPH, cardia myopathy, CHF, DM, HTN, HLD, OSA and CPAP admitted for left fingertip numbness and left knee/ankle/foot numbness.  MRI negative for acute stroke.  CTA head and neck left CCA atherosclerosis but no stenosis, patent anterior and posterior circulations.  2D echo pending.  LDL 64 and A1c 9.9.  UDS negative.  HIV negative.  However, his glucose level was high, with significant hyperglycemia.  He also had spiking fever yesterday on admission with TMX 102.6.  Currently he complains of sore throat but temperature normalized.  UA WBC 11-20.  And he is treated with Rocephin.  Patient currently still complains of left fingertip, left knee/ankle/foot numbness, also felt swelling sensation of the left leg.  However, there is no physical edema on the left leg.  On examination, symmetrical bilaterally on light touch sensation.  DTR diminished bilaterally, no Babinski, no upper neuron sign.  It is unclear what causes patient symptoms.  No evidence of stroke or cervical myelopathy.  Numbness sensation could be related to diabetic neuropathy given uncontrolled diabetes.  If symptoms not resolving, recommend outpatient EMG/NCS for further evaluation.  Continue treatment for possible URL.  No concern of endocarditis at  this time.  Continue Xarelto and statin for stroke prevention.  Neurology will sign off. Please call with questions. Pt will follow up [facility] in about 4 weeks. Thanks for the consult.   Rosalin Hawking, MD PhD Stroke Neurology 01/09/2018 6:27 PM  To contact Stroke Continuity provider, please refer to [email or web address] After hours, contact General Neurology

## 2018-01-09 NOTE — Progress Notes (Signed)
Pt called to say he was not feeling well, went in and pt was vomiting and sweating.  Pt requested for his throat to be looked at and it was noted to be red and inflammed and his epiglottis noted to be very red and inflammed.  Pt asked to make sure to let the Dr know about the epiglottis.  I will leave a sticky note for the dr.

## 2018-01-09 NOTE — Progress Notes (Signed)
Nutrition Brief Note  Patient identified on the Malnutrition Screening Tool (MST) Report.  62 year old male with numbness. PMH significant for type 2 diabetes mellitus, CHF, hyperlipidemia, and hypertension.  Spoke with pt and family member at bedside. Pt states, "I try to eat right at home." Pt reports eating 2 meals daily, stating that he has never been a breakfast eater. Lunch may include a biscuit. Dinner is pt's "big meal."  Pt reports that he portions his food in containers and puts these in the refrigerator to heat up at a later time.  Pt endorses a good appetite but states that it has decreased recently due to not feeling well.  Pt states this his body weight fluctuates between 290-310 lbs. RD suspects this is related to fluid status as pt endorses moderate edema in BLE.  Nutrition-focused physical exam completed. Findings are no fat depletion, no muscle depletion, and moderate edema in BLE.  Wt Readings from Last 15 Encounters:  01/08/18 (!) 137.6 kg  01/08/18 132.2 kg  01/05/18 132.2 kg  12/20/17 131.5 kg  11/09/17 128.2 kg  10/28/17 131.1 kg  10/06/17 134.3 kg  09/26/17 134.4 kg  09/16/17 131.6 kg  08/22/17 130 kg  07/26/17 125.6 kg  07/13/17 129.5 kg  07/12/17 128.5 kg  07/08/17 125.6 kg  07/01/17 128.2 kg    Body mass index is 40.02 kg/m. Patient meets criteria for obesity class III based on current BMI.   Current diet order is Carb Modified. No percent meal completion charted at this time. Labs and medications reviewed.   No nutrition interventions warranted at this time. If nutrition issues arise, please consult RD.    Gaynell Face, MS, RD, LDN Pager: [phone] Weekend/After Hours: [phone]

## 2018-01-09 NOTE — Progress Notes (Signed)
Called Dr Evette Doffing regarding tropin elevation this am.  Dr called back and stated that she had seen the results and was on the way over to see the pt

## 2018-01-09 NOTE — Progress Notes (Signed)
Made pt aware of the need for a rectal temp and urine culture.  Gave pt a clean urinal and will continue to remind the pt of the urine sample needed per orders

## 2018-01-09 NOTE — Care Management Note (Signed)
Case Management Note  Patient Details  Name: Terry Parks MRN: [id number] Date of Birth: February 02, 1956  Subjective/Objective:     Pt is from home alone. He is in with numbness. Pt states his neighbors could assist with minimal things but no other resources. Pt drives and uses [facility] and the [facility] for his meds.  PCP: Dr Maricela Bo Insurance: medicaid pending               Action/Plan: CM following as he may need MATCH or medication assistance at d/c.   Expected Discharge Date:                  Expected Discharge Plan:  Home/Self Care  In-House Referral:     Discharge planning Services     Post Acute Care Choice:    Choice offered to:     DME Arranged:    DME Agency:     HH Arranged:    HH Agency:     Status of Service:  In process, will continue to follow  If discussed at Long Length of Stay Meetings, dates discussed:    Additional Comments:  Pollie Friar, RN 01/09/2018, 4:25 PM

## 2018-01-09 NOTE — Progress Notes (Signed)
Pt alert and oriented x4, no complaints of pain or discomfort.  Bed in low position, call bell within reach.  Bed alarms on and functioning.  Assessment done and charted.  Will continue to monitor and do hourly rounding throughout the shift 

## 2018-01-09 NOTE — Progress Notes (Signed)
CT called and will be sending for the pt to have testing done.  They are in route to get him.  Patient made aware

## 2018-01-09 NOTE — Progress Notes (Signed)
Inpatient Diabetes Program Recommendations  AACE/ADA: New Consensus Statement on Inpatient Glycemic Control (2015)  Target Ranges:  Prepandial:   less than 140 mg/dL      Peak postprandial:   less than 180 mg/dL (1-2 hours)      Critically ill patients:  140 - 180 mg/dL   Lab Results  Component Value Date   GLUCAP 332 (H) 01/09/2018   HGBA1C 9.9 (H) 01/09/2018    Review of Glycemic ControlResults for HAIRO, GARRAWAY (MRN [id number]) as of 01/09/2018 13:38  Ref. Range 01/08/2018 15:52 01/08/2018 22:52 01/09/2018 07:12 01/09/2018 11:00  Glucose-Capillary Latest Ref Range: 70 - 99 mg/dL 238 (H) 260 (H) 386 (H) 332 (H)    Diabetes history: Type 2 DM  Outpatient Diabetes medications: Lantus 60 units q HS, Metformin 500 mg daily Current orders for Inpatient glycemic control:  Lantus 10 units q HS, Novolog 4 units tid with meals, Novolog moderate tid with meals and HS Inpatient Diabetes Program Recommendations:    Please increase Lantus to 40 units q HS.  Also consider increasing Novolog meal coverage to 6 units tid with meals.   Thanks,  Adah Perl, RN, BC-ADM Inpatient Diabetes Coordinator Pager [phone] (8a-5p)

## 2018-01-09 NOTE — Progress Notes (Signed)
   Subjective: Had one episode of vomiting overnight. Denies chest pain or increased sob. Left arm and leg numbness have dulled but still present.   Objective:  Vital signs in last 24 hours: Vitals:   01/09/18 0012 01/09/18 0346 01/09/18 0822 01/09/18 1156  BP: (!) 158/96 (!) 168/97 (!) 164/108 (!) 158/99  Pulse: (!) 106 (!) 117 (!) 115 100  Resp: 20 20 18 18   Temp: 99.3 F (37.4 C) 98 F (36.7 C) 99.4 F (37.4 C) 99.5 F (37.5 C)  TempSrc: Oral Oral Oral Oral  SpO2: 95% 97% 100% 96%  Weight:      Height:       Gen: Well appearing, NAD CV: RRR, no murmurs, no LEE Skin:  Groin area has patches of pruritic, thick, hyperpigmented skin.  Neuro: 5/5 strength in upper extremities, sensation intact bilaterally in upper and lower extremities. CN II-XII grossly intact   Assessment/Plan:  Active Problems:   Numbness   Fever  Mr. Colpitts is a 6yoM with insulin dependent type 2 DM, HTN, CHF, OSA, and BPH who presents with left arm/leg numbness, fever (102.6*F) rectally, leukocytosis to 17.5, and exam concerning for celluiltus of the groin.   Elevated Troponin: 0.14 --> 0.11 --> 0.14. Likely due to demand ischemia from fever and infection. Denies chest pain. EKG normal sinus without ST elevations, T wave inversions, or Q waves. Echo in Jan with EF 30-35%, moderate concentric hypertrophy, severe diffuse hypokinesis, severely dilated left atrium, reduced right ventricle systolic dysfunction.   Numbness: Numbness is unchanged this morning. Strength is 5/5 and sensation is symmetric. Doubt cervical spine compression due to lack of neck pain. Doubt serotonin syndrome due to lack of clonus. Could be peripheral neuropathy from uncontrolled diabetes.  Fever: no fevers overnight. Leukocytosis improved to 15 today. Continue ceftriaxone for probably cellulitis of the scrotum.  - f/u BCx and UCx  Diabetes: a1c in May 2019 was 7.2, repeat on admission 9.9. He reports non adherence to insulin  regimen at home. CBGs 300-400 over the last 24 hours. Will increase lantus to 20 units nightly and novolog 6 units with meals.  - continue home atorvastatin, gabapentin  A. Fib: continue home amiodarone and xarelto  Dispo: Anticipated discharge in approximately 1-2 day(s).   Isabelle Course, MD 01/09/2018, 2:37 PM Pager: [phone]

## 2018-01-10 ENCOUNTER — Inpatient Hospital Stay (HOSPITAL_COMMUNITY): Payer: Medicaid Other

## 2018-01-10 ENCOUNTER — Encounter (HOSPITAL_COMMUNITY): Payer: Self-pay | Admitting: General Practice

## 2018-01-10 DIAGNOSIS — I5030 Unspecified diastolic (congestive) heart failure: Secondary | ICD-10-CM

## 2018-01-10 DIAGNOSIS — R1310 Dysphagia, unspecified: Secondary | ICD-10-CM

## 2018-01-10 LAB — RESPIRATORY PANEL BY PCR
Adenovirus: NOT DETECTED
BORDETELLA PERTUSSIS-RVPCR: NOT DETECTED
CORONAVIRUS 229E-RVPPCR: NOT DETECTED
CORONAVIRUS OC43-RVPPCR: NOT DETECTED
Chlamydophila pneumoniae: NOT DETECTED
Coronavirus HKU1: NOT DETECTED
Coronavirus NL63: NOT DETECTED
INFLUENZA B-RVPPCR: NOT DETECTED
Influenza A: NOT DETECTED
Metapneumovirus: NOT DETECTED
Mycoplasma pneumoniae: NOT DETECTED
PARAINFLUENZA VIRUS 1-RVPPCR: NOT DETECTED
PARAINFLUENZA VIRUS 2-RVPPCR: NOT DETECTED
Parainfluenza Virus 3: NOT DETECTED
Parainfluenza Virus 4: NOT DETECTED
RESPIRATORY SYNCYTIAL VIRUS-RVPPCR: NOT DETECTED
RHINOVIRUS / ENTEROVIRUS - RVPPCR: NOT DETECTED

## 2018-01-10 LAB — BASIC METABOLIC PANEL
Anion gap: 9 (ref 5–15)
BUN: 12 mg/dL (ref 6–20)
CO2: 24 mmol/L (ref 22–32)
Calcium: 8.1 mg/dL — ABNORMAL LOW (ref 8.9–10.3)
Chloride: 101 mmol/L (ref 98–111)
Creatinine, Ser: 1.39 mg/dL — ABNORMAL HIGH (ref 0.61–1.24)
GFR calc Af Amer: >60 mL/min (ref >60)
GFR, EST NON AFRICAN AMERICAN: 54 mL/min — AB (ref >60)
Glucose, Bld: 171 mg/dL — ABNORMAL HIGH (ref 70–99)
Potassium: 3.6 mmol/L (ref 3.5–5.1)
SODIUM: 134 mmol/L — AB (ref 135–145)

## 2018-01-10 LAB — GLUCOSE, CAPILLARY
GLUCOSE-CAPILLARY: 174 mg/dL — AB (ref 70–99)
Glucose-Capillary: 166 mg/dL — ABNORMAL HIGH (ref 70–99)
Glucose-Capillary: 273 mg/dL — ABNORMAL HIGH (ref 70–99)
Glucose-Capillary: 183 mg/dL — ABNORMAL HIGH (ref 70–99)

## 2018-01-10 LAB — ECHOCARDIOGRAM COMPLETE
HEIGHTINCHES: 73 in
WEIGHTICAEL: 4853.6474 [oz_av]

## 2018-01-10 LAB — CBC
HCT: 34.4 % — ABNORMAL LOW (ref 39.0–52.0)
Hemoglobin: 10.3 g/dL — ABNORMAL LOW (ref 13.0–17.0)
MCH: 22.3 pg — ABNORMAL LOW (ref 26.0–34.0)
MCHC: 29.9 g/dL — ABNORMAL LOW (ref 30.0–36.0)
MCV: 74.6 fL — AB (ref 78.0–100.0)
Platelets: 169 10*3/uL (ref 150–400)
RBC: 4.61 MIL/uL (ref 4.22–5.81)
RDW: 16.9 % — AB (ref 11.5–15.5)
WBC: 19.4 10*3/uL — AB (ref 4.0–10.5)

## 2018-01-10 MED ORDER — PERFLUTREN LIPID MICROSPHERE
1.0000 mL | INTRAVENOUS | Status: AC | PRN
  Administered 2018-01-10: 2 mL via INTRAVENOUS
  Filled 2018-01-10: qty 10

## 2018-01-10 MED ORDER — GABAPENTIN 300 MG PO CAPS
300.0000 mg | ORAL_CAPSULE | Freq: Two times a day (BID) | ORAL | Status: DC
  Administered 2018-01-10 – 2018-01-11 (×2): 300 mg via ORAL
  Filled 2018-01-10 (×2): qty 1

## 2018-01-10 MED ORDER — SODIUM CHLORIDE 0.9 % IV SOLN
INTRAVENOUS | Status: AC
  Administered 2018-01-10: 13:00:00 via INTRAVENOUS

## 2018-01-10 MED ORDER — MENTHOL 3 MG MT LOZG: 1.0000 | LOZENGE | OROMUCOSAL | Status: DC | PRN

## 2018-01-10 NOTE — Evaluation (Signed)
Physical Therapy Evaluation Patient Details Name: Terry Parks MRN: [id number] DOB: 07-Nov-1955 Today's Date: 01/10/2018   History of Present Illness  patient is a 62 yo male with insulin dependent type 2 DM, HTN, CHF, OSA, and BPH who presents with left arm/leg numbness, fever (102.6*F) rectally, leukocytosis, now with nausea/vomiting and sore throat   Clinical Impression  Orders received for PT evaluation. Patient demonstrates deficits in functional mobility as indicated below. Will benefit from continued skilled PT to address deficits and maximize function. Will see as indicated and progress as tolerated.  Patient with no overt asymetries or focal deficits but does present with generalized weakness and instability. Recommend HHPT upon acute discharge.    Follow Up Recommendations Home health PT;Supervision - Intermittent    Equipment Recommendations  None recommended by PT    Recommendations for Other Services       Precautions / Restrictions Precautions Precautions: Fall      Mobility  Bed Mobility Overal bed mobility: Modified Independent             General bed mobility comments: increased time and effort to perform, no physical assit required  Transfers Overall transfer level: Needs assistance Equipment used: None Transfers: Sit to/from Stand Sit to Stand: Min guard         General transfer comment: min guard for stability, no physical assist required  Ambulation/Gait Ambulation/Gait assistance: Min guard Gait Distance (Feet): 90 Feet Assistive device: IV Pole Gait Pattern/deviations: Step-through pattern;Decreased stride length;Wide base of support Gait velocity: decreased Gait velocity interpretation: <1.8 ft/sec, indicate of risk for recurrent falls General Gait Details: patient with modest instability and drifting during ambulation, no physical assist required. Min guard for Scientist, research (medical)    Modified  Rankin (Stroke Patients Only) Modified Rankin (Stroke Patients Only) Pre-Morbid Rankin Score: No symptoms Modified Rankin: Moderate disability     Balance Overall balance assessment: Mild deficits observed, not formally tested                                           Pertinent Vitals/Pain Pain Assessment: Faces Faces Pain Scale: No hurt    Home Living Family/patient expects to be discharged to:: Private residence Living Arrangements: Alone Available Help at Discharge: Friend(s);Available PRN/intermittently Type of Home: Apartment Home Access: Stairs to enter Entrance Stairs-Rails: Right;Left;Can reach both Entrance Stairs-Number of Steps: 2 flights  Home Layout: Two level;1/2 bath on main level(has been staying on main level except bathing ) Home Equipment: None      Prior Function Level of Independence: Independent         Comments: Drives     Hand Dominance   Dominant Hand: Right    Extremity/Trunk Assessment   Upper Extremity Assessment Upper Extremity Assessment: Defer to OT evaluation    Lower Extremity Assessment Lower Extremity Assessment: Generalized weakness(history of significant peripheral neuropathy, R>L)       Communication   Communication: No difficulties  Cognition Arousal/Alertness: Awake/alert Behavior During Therapy: WFL for tasks assessed/performed Overall Cognitive Status: Within Functional Limits for tasks assessed                                        General Comments  Exercises     Assessment/Plan    PT Assessment Patient needs continued PT services  PT Problem List Decreased strength;Decreased activity tolerance;Decreased balance;Decreased mobility;Impaired sensation;Obesity       PT Treatment Interventions DME instruction;Gait training;Functional mobility training;Therapeutic activities;Therapeutic exercise;Stair training;Balance training;Neuromuscular re-education;Patient/family  education    PT Goals (Current goals can be found in the Care Plan section)  Acute Rehab PT Goals Patient Stated Goal: to feel better PT Goal Formulation: With patient Time For Goal Achievement: 01/24/18 Potential to Achieve Goals: Good    Frequency Min 3X/week   Barriers to discharge   multiple flights of stairs    Co-evaluation               AM-PAC PT "6 Clicks" Daily Activity  Outcome Measure Difficulty turning over in bed (including adjusting bedclothes, sheets and blankets)?: A Little Difficulty moving from lying on back to sitting on the side of the bed? : A Little Difficulty sitting down on and standing up from a chair with arms (e.g., wheelchair, bedside commode, etc,.)?: A Little Help needed moving to and from a bed to chair (including a wheelchair)?: A Little Help needed walking in hospital room?: A Little Help needed climbing 3-5 steps with a railing? : A Lot 6 Click Score: 17    End of Session Equipment Utilized During Treatment: Gait belt Activity Tolerance: Patient limited by fatigue Patient left: in bed;with call bell/phone within reach;with family/visitor present Nurse Communication: Mobility status PT Visit Diagnosis: Unsteadiness on feet (R26.81);Difficulty in walking, not elsewhere classified (R26.2)    Time: 1587-2761 PT Time Calculation (min) (ACUTE ONLY): 22 min   Charges:   PT Evaluation $PT Eval Moderate Complexity: 1 Mod          Alben Deeds, PT DPT  Board Certified Neurologic Specialist [place] 01/10/2018, 11:44 AM

## 2018-01-10 NOTE — Progress Notes (Signed)
   Subjective: Febrile yesterday evening with nausea, vomiting diaphoresis. Complaining of throat pain this morning. He feels like a big ball is in his throat. Denies cough, chest pain, difficulty swallowing.  Discussed with him that stroke work up has been negative and we think his numbness if related to diabetic neuropathy and his fevers, vomiting, throat pain are from a viral illness.   Objective:  Vital signs in last 24 hours: Vitals:   01/09/18 1950 01/10/18 0011 01/10/18 0407 01/10/18 0821  BP: 137/86 (!) 129/92 (!) 162/92 (!) 117/96  Pulse: (!) 106 (!) 102 (!) 104 (!) 104  Resp: 18 20 20 18   Temp: 99.6 F (37.6 C) (!) 102 F (38.9 C) 99.8 F (37.7 C) 97.6 F (36.4 C)  TempSrc: Oral Oral Oral Oral  SpO2: 100% 99% 97% 96%  Weight:      Height:       Gen: Well appearing, NAD HEENT: oropharynx erythematous, no exudates. Eyes with normal conjunctiva, no discharge. Left submanidbular ymphadenopathy.   CV: RRR, no murmurs, no LEE   Assessment/Plan:  Active Problems:   Numbness   Fever  Mr. Beyl is a 62yoM with insulin dependent type 2 DM, HTN, CHF, OSA, and BPH who presents with left arm/leg numbness, fever (102.6*F) rectally, leukocytosis, now with nausea/vomiting and sore throat   Numbness: Slightly improved this morning. Most likely related to uncontrolled diabetes and peripheral neuropathy. Will increase gabapentin, achieve better control of blood glucose, and f/u up outpatient for continued diabetes management.  Fever: Febrile overnight with worsening leukocytosis. Now with sore throat, myalgias, and diaphoresis with continued nausea, vomiting. Is not responding to antibiotics. Most likely viral illness. Will get a respiratory viral panel and discontinue antibiotics. Treat supportively with tylenol and throat lozenges.   AKI: Creating 1.48 on admission (baseline 1.0). On maintenance IVFs NS 181ml/hr.   Diabetes: Continue lantus to 20 units nightly and novolog 6 units  with meals. Reports difficulties getting his home lantus via [facility] and states he will call them today. Increase gabapentin as above.   A. Fib: continue home amiodarone and xarelto  Dispo: Anticipated discharge in approximately 1 day.   Isabelle Course, MD 01/10/2018, 10:16 AM Pager: [phone]

## 2018-01-10 NOTE — Evaluation (Signed)
Occupational Therapy Evaluation Patient Details Name: Terry Parks MRN: [id number] DOB: [birth year] Today's Date: 01/10/2018    History of Present Illness patient is a 62 yo male with insulin dependent type 2 DM, HTN, CHF, OSA, and BPH who presents with left arm/leg numbness, fever (102.6*F) rectally, leukocytosis, now with nausea/vomiting and sore throat    Clinical Impression   Pt admitted with above dx and now presenting with dynamic balance deficits, reduced knowledge re AE/AD use for safety/fall prevention ,and generalized weakness that limits his safety and independence in ADLs. Pt completed 50 ft of functional mobility self managing IV pole with (S)-CGA with fair balance. Pt would benefit from [facility] and 3 in 1 BSC for home to maximize independence and return to PLOF.     Follow Up Recommendations  Home health OT    Equipment Recommendations  3 in 1 bedside commode    Recommendations for Other Services PT consult     Precautions / Restrictions Precautions Precautions: Fall Restrictions Weight Bearing Restrictions: No      Mobility Bed Mobility Overal bed mobility: Modified Independent             General bed mobility comments: increased time and effort to perform, no physical assit required  Transfers Overall transfer level: Needs assistance Equipment used: None Transfers: Sit to/from Stand Sit to Stand: Supervision         General transfer comment: min guard for stability, no physical assist required    Balance Overall balance assessment: Mild deficits observed, not formally tested                                         ADL either performed or assessed with clinical judgement   ADL Overall ADL's : Needs assistance/impaired Eating/Feeding: Modified independent   Grooming: Wash/dry hands;Standing;Supervision/safety   Upper Body Bathing: Modified independent;Sitting   Lower Body Bathing: Minimal assistance;Sit to/from stand    Upper Body Dressing : Modified independent;Sitting   Lower Body Dressing: Minimal assistance;Sit to/from stand   Toilet Transfer: Supervision/safety   Toileting- Water quality scientist and Hygiene: Supervision/safety   Tub/ Banker: Supervision/safety   Functional mobility during ADLs: Supervision/safety       Vision Baseline Vision/History: No visual deficits Patient Visual Report: No change from baseline Vision Assessment?: No apparent visual deficits     Perception     Praxis      Pertinent Vitals/Pain Pain Assessment: No/denies pain Faces Pain Scale: No hurt     Hand Dominance Right   Extremity/Trunk Assessment Upper Extremity Assessment Upper Extremity Assessment: Generalized weakness(reports numbness in L UE)   Lower Extremity Assessment Lower Extremity Assessment: Defer to PT evaluation   Cervical / Trunk Assessment Cervical / Trunk Assessment: Normal   Communication Communication Communication: No difficulties   Cognition Arousal/Alertness: Awake/alert Behavior During Therapy: WFL for tasks assessed/performed Overall Cognitive Status: Within Functional Limits for tasks assessed                                     General Comments       Exercises     Shoulder Instructions      Home Living Family/patient expects to be discharged to:: Private residence Living Arrangements: Alone Available Help at Discharge: Friend(s);Available PRN/intermittently Type of Home: Other(Comment)(duplex) Home Access: Stairs to enter Entrance Stairs-Number of Steps:  2 flights  Entrance Stairs-Rails: Right;Left;Can reach both Home Layout: Two level;1/2 bath on main level Alternate Level Stairs-Number of Steps: flight Alternate Level Stairs-Rails: Right;Left;Can reach both Bathroom Shower/Tub: Occupational psychologist: Standard     Home Equipment: Cane - single point;Walker - 2 wheels          Prior Functioning/Environment Level  of Independence: Independent        Comments: Drives        OT Problem List: Decreased strength;Decreased activity tolerance;Impaired balance (sitting and/or standing);Decreased knowledge of use of DME or AE;Decreased knowledge of precautions;Cardiopulmonary status limiting activity      OT Treatment/Interventions: Self-care/ADL training;Therapeutic exercise;Therapeutic activities;Energy conservation;DME and/or AE instruction;Patient/family education;Balance training    OT Goals(Current goals can be found in the care plan section) Acute Rehab OT Goals Patient Stated Goal: to feel better OT Goal Formulation: With patient Time For Goal Achievement: 01/17/18 Potential to Achieve Goals: Good  OT Frequency: Min 2X/week   Barriers to D/C: Inaccessible home environment  pt must climb 2 flights of stairs       Co-evaluation              AM-PAC PT "6 Clicks" Daily Activity     Outcome Measure Help from another person eating meals?: None Help from another person taking care of personal grooming?: None Help from another person toileting, which includes using toliet, bedpan, or urinal?: A Little Help from another person bathing (including washing, rinsing, drying)?: A Little Help from another person to put on and taking off regular upper body clothing?: None Help from another person to put on and taking off regular lower body clothing?: A Little 6 Click Score: 21   End of Session Equipment Utilized During Treatment: Gait belt Nurse Communication: Mobility status  Activity Tolerance: Patient tolerated treatment well Patient left: in bed;with bed alarm set;with call bell/phone within reach  OT Visit Diagnosis: Unsteadiness on feet (R26.81);Muscle weakness (generalized) (M62.81)                Time: 5009-3818 OT Time Calculation (min): 18 min Charges:  OT General Charges $OT Visit: 1 Visit OT Evaluation $OT Eval Low Complexity: 1 Low  Curtis Sites OTR/L 01/10/2018, 1:49  PM

## 2018-01-10 NOTE — Progress Notes (Signed)
Inpatient Diabetes Program Recommendations  AACE/ADA: New Consensus Statement on Inpatient Glycemic Control (2015)  Target Ranges:  Prepandial:   less than 140 mg/dL      Peak postprandial:   less than 180 mg/dL (1-2 hours)      Critically ill patients:  140 - 180 mg/dL   Lab Results  Component Value Date   GLUCAP 174 (H) 01/10/2018   HGBA1C 9.9 (H) 01/09/2018   Results for TRYSTIN, TERHUNE (MRN [id number]) as of 01/10/2018 14:34  Ref. Range 09/16/2017 14:51 01/09/2018 05:09  Hemoglobin A1C Latest Ref Range: 4.8 - 5.6 % 7.2 9.9 (H)   Review of Glycemic ControlResults for ALONTAE, CHALOUX (MRN [id number]) as of 01/10/2018 14:34  Ref. Range 01/09/2018 11:00 01/09/2018 16:55 01/09/2018 21:10 01/10/2018 06:21 01/10/2018 11:09  Glucose-Capillary Latest Ref Range: 70 - 99 mg/dL 332 (H) 198 (H) 219 (H) 166 (H) 174 (H)   Diabetes history: Type 2 DM  Outpatient Diabetes medications: Lantus 60 units q HS, Metformin 500 mg daily Current orders for Inpatient glycemic control:  Lantus 20 units q HS, Novolog 6 units tid with meals, Novolog moderate tid with meals and HS  Inpatient Diabetes Program Recommendations:    Consider increasing Lantus to 25 units q HS.  Spoke briefly with patient today regarding DM and diabetes.  Discussed A1C with patient.  He states that his past A1C was in the 7% range.  I told him that it was now 9.9%.  Patient states, "I haven't been doing what I should".  Blood sugars improved with Basal/bolus in the hospital.  Note per MD that patient was having difficulty affording insulin prior to admit and he was supposed to contact Medication management clinic.  Case manager is following. Will follow.   Thanks,  Adah Perl, RN, BC-ADM Inpatient Diabetes Coordinator Pager [phone] (8a-5p)

## 2018-01-10 NOTE — Progress Notes (Signed)
  Echocardiogram 2D Echocardiogram has been performed.  Terry Parks 01/10/2018, 10:27 AM

## 2018-01-10 NOTE — Care Management Note (Signed)
Case Management Note  Patient Details  Name: Terry Parks MRN: [id number] Date of Birth: July 16, 1955  Subjective/Objective:                    Action/Plan: Pt with orders for [facility] services. Pt has no insurance. CM called [name] with [facility] to see if patient qualified for charity [facility] services. Pt will only receive [facility] under the charity services if qualifies. Pt also with orders for 3 in 1. [name] with [facility] aware and will have DME delivered to his room. CM following for further d/c needs.   Expected Discharge Date:                  Expected Discharge Plan:  [place]  In-House Referral:     Discharge planning Services  CM Consult  Post Acute Care Choice:  Home Health, Durable Medical Equipment Choice offered to:  Patient  DME Arranged:  3-N-1 DME Agency:  [place]:  PT, OT(pt will only receive PT d/t charity services) [place]:  [place]  Status of Service:  In process, will continue to follow  If discussed at Long Length of Stay Meetings, dates discussed:    Additional Comments:  Pollie Friar, RN 01/10/2018, 4:35 PM

## 2018-01-11 ENCOUNTER — Other Ambulatory Visit: Payer: Self-pay | Admitting: Pharmacist

## 2018-01-11 DIAGNOSIS — E1142 Type 2 diabetes mellitus with diabetic polyneuropathy: Secondary | ICD-10-CM

## 2018-01-11 DIAGNOSIS — N39498 Other specified urinary incontinence: Secondary | ICD-10-CM

## 2018-01-11 DIAGNOSIS — L291 Pruritus scroti: Secondary | ICD-10-CM

## 2018-01-11 DIAGNOSIS — J069 Acute upper respiratory infection, unspecified: Secondary | ICD-10-CM

## 2018-01-11 DIAGNOSIS — N401 Enlarged prostate with lower urinary tract symptoms: Secondary | ICD-10-CM

## 2018-01-11 LAB — BASIC METABOLIC PANEL
Anion gap: 9 (ref 5–15)
BUN: 19 mg/dL (ref 6–20)
CALCIUM: 7.8 mg/dL — AB (ref 8.9–10.3)
CO2: 22 mmol/L (ref 22–32)
CREATININE: 1.68 mg/dL — AB (ref 0.61–1.24)
Chloride: 101 mmol/L (ref 98–111)
GFR calc Af Amer: 49 mL/min — ABNORMAL LOW (ref >60)
GFR, EST NON AFRICAN AMERICAN: 43 mL/min — AB (ref >60)
GLUCOSE: 232 mg/dL — AB (ref 70–99)
Potassium: 3.6 mmol/L (ref 3.5–5.1)
SODIUM: 132 mmol/L — AB (ref 135–145)

## 2018-01-11 LAB — CBC
HCT: 33.2 % — ABNORMAL LOW (ref 39.0–52.0)
Hemoglobin: 9.8 g/dL — ABNORMAL LOW (ref 13.0–17.0)
MCH: 22 pg — ABNORMAL LOW (ref 26.0–34.0)
MCHC: 29.5 g/dL — AB (ref 30.0–36.0)
MCV: 74.6 fL — ABNORMAL LOW (ref 78.0–100.0)
PLATELETS: 149 10*3/uL — AB (ref 150–400)
RBC: 4.45 MIL/uL (ref 4.22–5.81)
RDW: 17.2 % — AB (ref 11.5–15.5)
WBC: 14.5 10*3/uL — AB (ref 4.0–10.5)

## 2018-01-11 LAB — SEDIMENTATION RATE: Sed Rate: 68 mm/hr — ABNORMAL HIGH (ref 0–16)

## 2018-01-11 LAB — GLUCOSE, CAPILLARY
GLUCOSE-CAPILLARY: 188 mg/dL — AB (ref 70–99)
GLUCOSE-CAPILLARY: 209 mg/dL — AB (ref 70–99)
Glucose-Capillary: 224 mg/dL — ABNORMAL HIGH (ref 70–99)

## 2018-01-11 LAB — DIFFERENTIAL
ABS IMMATURE GRANULOCYTES: 0.1 10*3/uL (ref 0.0–0.1)
Basophils Absolute: 0 10*3/uL (ref 0.0–0.1)
Basophils Relative: 0 %
Eosinophils Absolute: 0.1 10*3/uL (ref 0.0–0.7)
Eosinophils Relative: 1 %
Immature Granulocytes: 1 %
LYMPHS ABS: 1.6 10*3/uL (ref 0.7–4.0)
Lymphocytes Relative: 11 %
MONO ABS: 1.3 10*3/uL — AB (ref 0.1–1.0)
MONOS PCT: 9 %
NEUTROS ABS: 11.5 10*3/uL — AB (ref 1.7–7.7)
Neutrophils Relative %: 78 %

## 2018-01-11 LAB — C-REACTIVE PROTEIN: CRP: 27.8 mg/dL — ABNORMAL HIGH (ref <1.0)

## 2018-01-11 MED ORDER — SODIUM CHLORIDE 0.9 % IV SOLN
INTRAVENOUS | Status: DC
  Administered 2018-01-11: 10:00:00 via INTRAVENOUS

## 2018-01-11 MED ORDER — BASAGLAR KWIKPEN 100 UNIT/ML ~~LOC~~ SOPN: 60.0000 [IU] | PEN_INJECTOR | Freq: Every day | SUBCUTANEOUS | 3 refills | Status: DC

## 2018-01-11 MED ORDER — INSULIN GLARGINE 100 UNIT/ML ~~LOC~~ SOLN
25.0000 [IU] | Freq: Every day | SUBCUTANEOUS | Status: DC
  Filled 2018-01-11: qty 0.25

## 2018-01-11 MED ORDER — HYDROCERIN EX CREA
TOPICAL_CREAM | Freq: Three times a day (TID) | CUTANEOUS | Status: DC
  Filled 2018-01-11: qty 113

## 2018-01-11 NOTE — Progress Notes (Signed)
   Subjective: Feeling much better today. Did have another fever yesterday evening but patient was asymptomatic. The subjective "lump" in his throat is gone and the numbness in his left hand is almost gone. He denies cough. He does report groin pruritis. We discussed discharge today if he feels up to it. Will ambulate today.  Objective:  Vital signs in last 24 hours: Vitals:   01/10/18 1928 01/11/18 0049 01/11/18 0428 01/11/18 0856  BP: 111/69 115/75 (!) 97/53 130/82  Pulse: 87 84 76 93  Resp: 20 18 20 16   Temp: (!) 101.4 F (38.6 C) 98.2 F (36.8 C) 97.7 F (36.5 C) 98 F (36.7 C)  TempSrc: Oral Oral Oral Oral  SpO2: 91% 100% 100% 100%  Weight:      Height:       Gen: Well appearing, NAD Pulm: CTAB, normal resp effort Ext: no LEE GU: white powder on the groin and genital area. No erythema. No scrotal swelling or pain. No excoriations.    Assessment/Plan:  Principal Problem:   Fever Active Problems:   Numbness  Mr. Mazzie is a 89yoM with insulin dependent type 2 DM, HTN, CHF, OSA, and BPH who presents with left arm/leg numbness, fever (102.6*F) rectally, leukocytosis, now with nausea/vomiting and sore throat   Numbness: Notable improvement today. He states it is almost gone.   Fever: Febrile yesterday evening but patient asymptomatic and fever resolved without intervention. Afebrile this morning. Leukocytosis improving today. Sore throat has resolved. Will ambulate today and discharge home if patient is feeling well.   AKI: Worsening creatine today. IVFs were discontinued yesterday for an unknown reason. Will resume maintenance IVFs NS 12ml/hr. Bladder was mildly distended on renal ultrasound. Endorsing urinary incontinence. Will check post void residual.   Diabetes: Increase lantus to 25 units nightly and novolog 6 units with meals. Case management is following if we need assistance getting him insulin for home.   A. Fib: continue home amiodarone and  xarelto  Dispo: Anticipated discharge in approximately 1 day.   Isabelle Course, MD 01/11/2018, 9:06 AM Pager: [phone]

## 2018-01-11 NOTE — Progress Notes (Signed)
Patient discharged home with home health and DME 3-1 toilet, reviewed medications and all follow up appointments with patient he says he understands the medications he is to stop and when his next doses are for the medications he is to continue. Son came to pick him up but never came up to the floor, his IV was D/C'd and was in no pain. Was taken down stairs by wheel chair by Antionette Char and Itzal.

## 2018-01-11 NOTE — Discharge Summary (Signed)
Name: Terry Parks MRN: [id number] DOB: 1955-08-02 62 y.o. PCP: [name], MD  Date of Admission: 01/08/2018  9:22 AM Date of Discharge: 01/11/2018 Attending Physician: No att. providers found  Discharge Diagnosis: 1. Viral URI 2. Diabetic peripheral neuropathy   Discharge Medications: Allergies as of 01/11/2018      Reactions   Lisinopril Cough      Medication List    STOP taking these medications   chlorhexidine gluconate (MEDLINE KIT) 0.12 % solution Commonly known as:  PERIDEX   Empagliflozin-metFORMIN HCl ER 09-998 MG Tb24   fluconazole 200 MG tablet Commonly known as:  DIFLUCAN   lidocaine 2 % solution Commonly known as:  XYLOCAINE     TAKE these medications   amiodarone 200 MG tablet Commonly known as:  PACERONE Take 1 tablet (200 mg total) by mouth daily.   amLODipine-atorvastatin 5-40 MG tablet Commonly known as:  CADUET Take 1 tablet by mouth daily.   atorvastatin 40 MG tablet Commonly known as:  LIPITOR Take 40 mg by mouth daily.   BASAGLAR KWIKPEN 100 UNIT/ML Sopn Inject 0.6 mLs (60 Units total) into the skin daily.   carvedilol 12.5 MG tablet Commonly known as:  COREG Take 1 tablet (12.5 mg total) by mouth 2 (two) times daily with a meal.   DULoxetine 60 MG capsule Commonly known as:  CYMBALTA Take 1 capsule (60 mg total) by mouth daily.   furosemide 40 MG tablet Commonly known as:  LASIX Take 40 mg by mouth 2 (two) times daily.   gabapentin 300 MG capsule Commonly known as:  NEURONTIN Take 1 capsule (300 mg total) by mouth daily.   glucose blood test strip Use as directed to test blood sugar three times a day before meals.   Insulin Pen Needle 31G X 6 MM Misc 1 pen by Does not apply route at bedtime.   isosorbide mononitrate 30 MG 24 hr tablet Commonly known as:  IMDUR Take 1 tablet (30 mg total) by mouth daily.   Lancets Misc Use to test blood sugar three times a day before meals.   losartan 50 MG tablet Commonly  known as:  COZAAR Take 1 tablet (50 mg total) by mouth daily.   metFORMIN 500 MG 24 hr tablet Commonly known as:  GLUCOPHAGE-XR Take 500 mg by mouth daily with breakfast.   Misc. Devices Misc by Does not apply route. C-PAP   nystatin cream Commonly known as:  MYCOSTATIN Apply 1 application topically 2 (two) times daily.   rivaroxaban 20 MG Tabs tablet Commonly known as:  XARELTO Take 20 mg by mouth daily with breakfast.       Disposition and follow-up:   [name] was discharged from [facility] in Good condition.  At the hospital follow up visit please address:  1. Please remind him to check his temperature at home a couple times a week. We were not able to determine the exact source of his fevers in the hospital but think it is likely a self limited viral illness. His CRP and ESR were elevated. Please recheck these in a month or so.    Please address diabetes management. a1c worse than prior (9.9). Increase gabapentin if tolerated  2.  Labs / imaging needed at time of follow-up: CBC and BMP  3.  Pending labs/ test needing follow-up: none  Follow-up Appointments: Follow-up Information    [facility]. Schedule an appointment as soon as possible for a visit in 4 week(s).  Specialty:  Neurology Contact information: [address] [facility] [place] Follow up.   Specialty:  [place] Why:  Terry Parks will call to arrange appointment Contact information: [address] [place] [address] [phone]        [facility] Follow up.   Why:  3n1 delivered to room Contact information: [address] [place] [address] [phone]           Hospital Course by problem list: 1. [name] has uncontrolled type 2 diabetes, HTN, CHF, OSA who presented with two days of  left arm and leg numbness. He was also found to be febrile to 101.53F and leukocytosis to 15. Stroke work up was negative. His numbness was attributed to diabetic peripheral neuropathy and gabapentin was increased. His initial exam was concerning for scrotal cellulitis and possible UTI. He was started on antibiotics. On day 2, he continued to have intermittent fevers, leukocytosis worsened, and he developed a sore throat. A respiratory viral panel was ordered. The antibiotics were discontinued. On day 3, RVP was negative, his numbness had resolved, leukocytosis improved, and sore throat resolved. He continued to have intermittent fevers, but felt back to baseline. ESR and CRP were elevated but this could be attributed to a viral illness. He was able to walk without difficulty and was tolerating po like normally. We felt that he had a self-limited viral illness that would continue to resolve. He was instructed to continue checking his temperature at home a couple times a week and follow up with his PCP.   Discharge Vitals:   BP 106/72 (BP Location: Right Arm)   Pulse 94   Temp 99 F (37.2 C) (Oral)   Resp 18   Ht _0  (1.854 m)   Wt (!) 137.6 kg   SpO2 100%   BMI 40.02 kg/m   Pertinent Labs, Studies, and Procedures:  CT head normal MRI brain no acute stroke. Symmetric nonspecific globus pallidus lesions. Small vessel disease. Atrophy.  Echo: EF 35-40%, LVH, diffuse hypokinesis, g2dd,  CTA head/neck: no emergent large vessel occlusion or stenosis Renal US: No hydronephrosis but study limited due to body habitus  Discharge Instructions: Discharge Instructions    Ambulatory referral to Neurology   Complete by:  As directed    Follow up with [place] provider either Dr. Jaynee Eagles, [facility] or Krista Blue in about 4 weeks. Thanks.   Call MD for:  temperature >100.4   Complete by:  As directed    Diet - low sodium heart healthy   Complete by:  As directed    Discharge instructions   Complete by:  As directed     Terry Parks, Terry Parks were admitted for a viral infection. I'm glad you started to feel better on your own. Please go to your clinic appointment Friday and try to get your diabetes under better control. I increased your gabapentin to help with the numbness in your feet and left hand. You can talk to Dr. Maricela Bo about increasing it more if you'd like. I will send her a message about your hospitalization and let her know about rechecking the ESR and CRP in a month or so.   It was a pleasure taking care of you. If you're fevers don't go away within a week, call the clinic at [phone] and see if they can see you.  -Dr. Donne Hazel  Increase activity slowly   Complete by:  As directed       Signed: Isabelle Course, MD 01/12/2018, 7:05 AM   Pager: [phone]

## 2018-01-11 NOTE — Progress Notes (Signed)
Discharge Planning: Notified [facility] of dc home with HHPT. Pt has 3n1 bedside commnode. Jonnie Finner RN CCM Case Mgmt phone [phone]

## 2018-01-11 NOTE — Progress Notes (Signed)
Physical Therapy Treatment Patient Details Name: Terry Parks MRN: [id number] DOB: 08-15-1955 Today's Date: 01/11/2018    History of Present Illness patient is a 62 yo male with insulin dependent type 2 DM, HTN, CHF, OSA, and BPH who presents with left arm/leg numbness, fever (102.6*F) rectally, leukocytosis, now with nausea/vomiting and sore throat     PT Comments    Patient is making progress toward PT goals and tolerated increased activity this session. Pt requires min guard for safety with OOB mobility. Current plan remains appropriate.    Follow Up Recommendations  Home health PT;Supervision - Intermittent     Equipment Recommendations  None recommended by PT    Recommendations for Other Services       Precautions / Restrictions Precautions Precautions: Fall    Mobility  Bed Mobility Overal bed mobility: Modified Independent             General bed mobility comments: increased time and effort to perform, no physical assit required  Transfers Overall transfer level: Needs assistance Equipment used: None Transfers: Sit to/from Stand Sit to Stand: Min guard         General transfer comment: min guard for stability, no physical assist required  Ambulation/Gait Ambulation/Gait assistance: Min guard Gait Distance (Feet): 220 Feet Assistive device: None Gait Pattern/deviations: Step-through pattern;Decreased stride length;Wide base of support Gait velocity: decreased   General Gait Details: pt with unsteady gait and with LOB at times however no assistance required to recover   Stairs Stairs: Yes Stairs assistance: Min guard Stair Management: One rail Right;Step to pattern;Forwards Number of Stairs: 10 General stair comments: step to pattern and pt SOB after ascension; unable to get HR/SpO2 reading at the time; min guard for safety   Wheelchair Mobility    Modified Rankin (Stroke Patients Only) Modified Rankin (Stroke Patients Only) Pre-Morbid  Rankin Score: No symptoms Modified Rankin: Moderate disability     Balance Overall balance assessment: Mild deficits observed, not formally tested                                          Cognition Arousal/Alertness: Awake/alert Behavior During Therapy: WFL for tasks assessed/performed Overall Cognitive Status: Within Functional Limits for tasks assessed                                        Exercises      General Comments        Pertinent Vitals/Pain Pain Assessment: No/denies pain    Home Living                      Prior Function            PT Goals (current goals can now be found in the care plan section) Acute Rehab PT Goals PT Goal Formulation: With patient Time For Goal Achievement: 01/24/18 Potential to Achieve Goals: Good Progress towards PT goals: Progressing toward goals    Frequency    Min 3X/week      PT Plan Current plan remains appropriate    Co-evaluation              AM-PAC PT "6 Clicks" Daily Activity  Outcome Measure  Difficulty turning over in bed (including adjusting bedclothes, sheets and blankets)?: None Difficulty moving from lying on  back to sitting on the side of the bed? : A Little Difficulty sitting down on and standing up from a chair with arms (e.g., wheelchair, bedside commode, etc,.)?: A Little Help needed moving to and from a bed to chair (including a wheelchair)?: A Little Help needed walking in hospital room?: A Little Help needed climbing 3-5 steps with a railing? : A Little 6 Click Score: 19    End of Session Equipment Utilized During Treatment: Gait belt Activity Tolerance: Patient limited by fatigue Patient left: with call bell/phone within reach;in chair Nurse Communication: Mobility status PT Visit Diagnosis: Unsteadiness on feet (R26.81);Difficulty in walking, not elsewhere classified (R26.2)     Time: 8756-4332 PT Time Calculation (min) (ACUTE ONLY): 32  min  Charges:  $Gait Training: 8-22 mins $Therapeutic Activity: 8-22 mins                     Earney Navy, PTA Pager: [phone]     Darliss Cheney 01/11/2018, 1:17 PM

## 2018-01-13 ENCOUNTER — Inpatient Hospital Stay (HOSPITAL_COMMUNITY): Payer: Medicaid Other

## 2018-01-13 ENCOUNTER — Other Ambulatory Visit: Payer: Self-pay

## 2018-01-13 ENCOUNTER — Ambulatory Visit (INDEPENDENT_AMBULATORY_CARE_PROVIDER_SITE_OTHER): Payer: Self-pay | Admitting: Internal Medicine

## 2018-01-13 ENCOUNTER — Ambulatory Visit: Payer: Self-pay | Admitting: Pharmacist

## 2018-01-13 ENCOUNTER — Inpatient Hospital Stay (HOSPITAL_COMMUNITY)
Admission: AD | Admit: 2018-01-13 | Discharge: 2018-01-24 | DRG: 388 | Disposition: A | Discharge status: Home / Self Care | Payer: Medicaid Other | Source: Ambulatory Visit | Attending: Internal Medicine | Admitting: Internal Medicine

## 2018-01-13 ENCOUNTER — Encounter: Payer: Self-pay | Admitting: Internal Medicine

## 2018-01-13 VITALS — BP 147/105 | HR 99 | Temp 97.8°F | Ht 73.0 in | Wt 291.0 lb

## 2018-01-13 DIAGNOSIS — K56609 Unspecified intestinal obstruction, unspecified as to partial versus complete obstruction: Secondary | ICD-10-CM | POA: Diagnosis not present

## 2018-01-13 DIAGNOSIS — Z4659 Encounter for fitting and adjustment of other gastrointestinal appliance and device: Secondary | ICD-10-CM

## 2018-01-13 DIAGNOSIS — K651 Peritoneal abscess: Secondary | ICD-10-CM | POA: Diagnosis not present

## 2018-01-13 DIAGNOSIS — I429 Cardiomyopathy, unspecified: Secondary | ICD-10-CM | POA: Diagnosis present

## 2018-01-13 DIAGNOSIS — I130 Hypertensive heart and chronic kidney disease with heart failure and stage 1 through stage 4 chronic kidney disease, or unspecified chronic kidney disease: Secondary | ICD-10-CM | POA: Diagnosis present

## 2018-01-13 DIAGNOSIS — Z8546 Personal history of malignant neoplasm of prostate: Secondary | ICD-10-CM

## 2018-01-13 DIAGNOSIS — Z9079 Acquired absence of other genital organ(s): Secondary | ICD-10-CM

## 2018-01-13 DIAGNOSIS — I4891 Unspecified atrial fibrillation: Secondary | ICD-10-CM | POA: Diagnosis present

## 2018-01-13 DIAGNOSIS — I5022 Chronic systolic (congestive) heart failure: Secondary | ICD-10-CM | POA: Diagnosis not present

## 2018-01-13 DIAGNOSIS — Z794 Long term (current) use of insulin: Secondary | ICD-10-CM

## 2018-01-13 DIAGNOSIS — R066 Hiccough: Secondary | ICD-10-CM | POA: Diagnosis not present

## 2018-01-13 DIAGNOSIS — N183 Chronic kidney disease, stage 3 (moderate): Secondary | ICD-10-CM | POA: Diagnosis not present

## 2018-01-13 DIAGNOSIS — R1114 Bilious vomiting: Secondary | ICD-10-CM | POA: Insufficient documentation

## 2018-01-13 DIAGNOSIS — Z79899 Other long term (current) drug therapy: Secondary | ICD-10-CM

## 2018-01-13 DIAGNOSIS — E878 Other disorders of electrolyte and fluid balance, not elsewhere classified: Secondary | ICD-10-CM | POA: Diagnosis not present

## 2018-01-13 DIAGNOSIS — E1142 Type 2 diabetes mellitus with diabetic polyneuropathy: Secondary | ICD-10-CM

## 2018-01-13 DIAGNOSIS — E785 Hyperlipidemia, unspecified: Secondary | ICD-10-CM

## 2018-01-13 DIAGNOSIS — I10 Essential (primary) hypertension: Secondary | ICD-10-CM

## 2018-01-13 DIAGNOSIS — E1122 Type 2 diabetes mellitus with diabetic chronic kidney disease: Secondary | ICD-10-CM | POA: Diagnosis not present

## 2018-01-13 DIAGNOSIS — G4733 Obstructive sleep apnea (adult) (pediatric): Secondary | ICD-10-CM | POA: Diagnosis present

## 2018-01-13 DIAGNOSIS — I110 Hypertensive heart disease with heart failure: Secondary | ICD-10-CM

## 2018-01-13 DIAGNOSIS — E11319 Type 2 diabetes mellitus with unspecified diabetic retinopathy without macular edema: Secondary | ICD-10-CM | POA: Diagnosis present

## 2018-01-13 DIAGNOSIS — K5651 Intestinal adhesions [bands], with partial obstruction: Secondary | ICD-10-CM | POA: Diagnosis not present

## 2018-01-13 DIAGNOSIS — N179 Acute kidney failure, unspecified: Secondary | ICD-10-CM | POA: Diagnosis present

## 2018-01-13 DIAGNOSIS — B9562 Methicillin resistant Staphylococcus aureus infection as the cause of diseases classified elsewhere: Secondary | ICD-10-CM | POA: Diagnosis not present

## 2018-01-13 DIAGNOSIS — R1010 Upper abdominal pain, unspecified: Secondary | ICD-10-CM

## 2018-01-13 DIAGNOSIS — K567 Ileus, unspecified: Secondary | ICD-10-CM | POA: Diagnosis not present

## 2018-01-13 DIAGNOSIS — E669 Obesity, unspecified: Secondary | ICD-10-CM | POA: Diagnosis present

## 2018-01-13 DIAGNOSIS — E860 Dehydration: Secondary | ICD-10-CM | POA: Diagnosis present

## 2018-01-13 DIAGNOSIS — I5042 Chronic combined systolic (congestive) and diastolic (congestive) heart failure: Secondary | ICD-10-CM

## 2018-01-13 DIAGNOSIS — Z6837 Body mass index (BMI) 37.0-37.9, adult: Secondary | ICD-10-CM

## 2018-01-13 DIAGNOSIS — I481 Persistent atrial fibrillation: Secondary | ICD-10-CM

## 2018-01-13 DIAGNOSIS — N400 Enlarged prostate without lower urinary tract symptoms: Secondary | ICD-10-CM | POA: Diagnosis present

## 2018-01-13 DIAGNOSIS — R531 Weakness: Secondary | ICD-10-CM

## 2018-01-13 DIAGNOSIS — E876 Hypokalemia: Secondary | ICD-10-CM | POA: Diagnosis not present

## 2018-01-13 DIAGNOSIS — Z7901 Long term (current) use of anticoagulants: Secondary | ICD-10-CM | POA: Diagnosis not present

## 2018-01-13 DIAGNOSIS — D631 Anemia in chronic kidney disease: Secondary | ICD-10-CM | POA: Diagnosis present

## 2018-01-13 DIAGNOSIS — E11649 Type 2 diabetes mellitus with hypoglycemia without coma: Secondary | ICD-10-CM | POA: Diagnosis not present

## 2018-01-13 DIAGNOSIS — D72829 Elevated white blood cell count, unspecified: Secondary | ICD-10-CM

## 2018-01-13 DIAGNOSIS — R1033 Periumbilical pain: Secondary | ICD-10-CM

## 2018-01-13 LAB — CULTURE, BLOOD (ROUTINE X 2)
CULTURE: NO GROWTH
Culture: NO GROWTH
SPECIAL REQUESTS: ADEQUATE

## 2018-01-13 LAB — BASIC METABOLIC PANEL
Anion gap: 13 (ref 5–15)
BUN: 37 mg/dL — ABNORMAL HIGH (ref 6–20)
CO2: 24 mmol/L (ref 22–32)
Calcium: 8.2 mg/dL — ABNORMAL LOW (ref 8.9–10.3)
Chloride: 91 mmol/L — ABNORMAL LOW (ref 98–111)
Creatinine, Ser: 2.08 mg/dL — ABNORMAL HIGH (ref 0.61–1.24)
GFR calc Af Amer: 38 mL/min — ABNORMAL LOW (ref >60)
GFR calc non Af Amer: 33 mL/min — ABNORMAL LOW (ref >60)
Glucose, Bld: 344 mg/dL — ABNORMAL HIGH (ref 70–99)
Potassium: 4.1 mmol/L (ref 3.5–5.1)
Sodium: 128 mmol/L — ABNORMAL LOW (ref 135–145)

## 2018-01-13 LAB — HEPATIC FUNCTION PANEL
ALT: 25 U/L (ref 0–44)
AST: 47 U/L — ABNORMAL HIGH (ref 15–41)
Albumin: 2.3 g/dL — ABNORMAL LOW (ref 3.5–5.0)
Alkaline Phosphatase: 105 U/L (ref 38–126)
Bilirubin, Direct: 0.6 mg/dL — ABNORMAL HIGH (ref 0.0–0.2)
Indirect Bilirubin: 0.7 mg/dL (ref 0.3–0.9)
Total Bilirubin: 1.3 mg/dL — ABNORMAL HIGH (ref 0.3–1.2)
Total Protein: 6.8 g/dL (ref 6.5–8.1)

## 2018-01-13 LAB — CBC
HCT: 36.9 % — ABNORMAL LOW (ref 39.0–52.0)
Hemoglobin: 11.5 g/dL — ABNORMAL LOW (ref 13.0–17.0)
MCH: 22.4 pg — ABNORMAL LOW (ref 26.0–34.0)
MCHC: 31.2 g/dL (ref 30.0–36.0)
MCV: 71.8 fL — ABNORMAL LOW (ref 78.0–100.0)
Platelets: 306 10*3/uL (ref 150–400)
RBC: 5.14 MIL/uL (ref 4.22–5.81)
RDW: 17.2 % — ABNORMAL HIGH (ref 11.5–15.5)
WBC: 17.8 10*3/uL — ABNORMAL HIGH (ref 4.0–10.5)

## 2018-01-13 LAB — GLUCOSE, CAPILLARY: GLUCOSE-CAPILLARY: 316 mg/dL — AB (ref 70–99)

## 2018-01-13 MED ORDER — CARVEDILOL 12.5 MG PO TABS
12.5000 mg | ORAL_TABLET | Freq: Two times a day (BID) | ORAL | Status: DC
  Administered 2018-01-13 – 2018-01-15 (×4): 12.5 mg via ORAL
  Filled 2018-01-13 (×4): qty 1

## 2018-01-13 MED ORDER — LACTATED RINGERS IV BOLUS
500.0000 mL | Freq: Once | INTRAVENOUS | Status: AC
  Administered 2018-01-13: 500 mL via INTRAVENOUS

## 2018-01-13 MED ORDER — ONDANSETRON HCL 4 MG/2ML IJ SOLN
4.0000 mg | Freq: Four times a day (QID) | INTRAMUSCULAR | Status: DC | PRN
  Administered 2018-01-13: 4 mg via INTRAVENOUS
  Filled 2018-01-13: qty 2

## 2018-01-13 MED ORDER — RIVAROXABAN 20 MG PO TABS
20.0000 mg | ORAL_TABLET | Freq: Every day | ORAL | Status: DC
  Administered 2018-01-13: 20 mg via ORAL
  Filled 2018-01-13: qty 1

## 2018-01-13 MED ORDER — METFORMIN HCL ER 500 MG PO TB24: 1000.0000 mg | ORAL_TABLET | Freq: Two times a day (BID) | ORAL | 11 refills | Status: DC

## 2018-01-13 MED ORDER — ATORVASTATIN CALCIUM 40 MG PO TABS
40.0000 mg | ORAL_TABLET | Freq: Every day | ORAL | Status: DC
  Administered 2018-01-14: 40 mg via ORAL
  Filled 2018-01-13: qty 1

## 2018-01-13 MED ORDER — AMLODIPINE BESYLATE 5 MG PO TABS
5.0000 mg | ORAL_TABLET | Freq: Once | ORAL | Status: AC
  Administered 2018-01-14: 5 mg via ORAL
  Filled 2018-01-13: qty 1

## 2018-01-13 MED ORDER — ISOSORBIDE MONONITRATE ER 30 MG PO TB24
30.0000 mg | ORAL_TABLET | Freq: Every day | ORAL | Status: DC
  Administered 2018-01-14 – 2018-01-15 (×2): 30 mg via ORAL
  Filled 2018-01-13 (×2): qty 1

## 2018-01-13 MED ORDER — INSULIN ASPART 100 UNIT/ML ~~LOC~~ SOLN
0.0000 [IU] | Freq: Three times a day (TID) | SUBCUTANEOUS | Status: DC
  Administered 2018-01-14: 4 [IU] via SUBCUTANEOUS
  Administered 2018-01-14: 2 [IU] via SUBCUTANEOUS
  Administered 2018-01-14: 4 [IU] via SUBCUTANEOUS

## 2018-01-13 MED ORDER — FUROSEMIDE 40 MG PO TABS
40.0000 mg | ORAL_TABLET | Freq: Two times a day (BID) | ORAL | Status: DC
  Administered 2018-01-13 – 2018-01-15 (×4): 40 mg via ORAL
  Filled 2018-01-13 (×4): qty 1

## 2018-01-13 MED ORDER — INSULIN ASPART 100 UNIT/ML ~~LOC~~ SOLN
0.0000 [IU] | Freq: Every day | SUBCUTANEOUS | Status: DC
  Administered 2018-01-13: 4 [IU] via SUBCUTANEOUS

## 2018-01-13 MED ORDER — ONDANSETRON HCL 4 MG PO TABS: 4.0000 mg | ORAL_TABLET | Freq: Four times a day (QID) | ORAL | Status: DC | PRN

## 2018-01-13 MED ORDER — AMIODARONE HCL 200 MG PO TABS: 200.0000 mg | ORAL_TABLET | Freq: Every day | ORAL | Status: DC

## 2018-01-13 MED ORDER — GABAPENTIN 300 MG PO CAPS
300.0000 mg | ORAL_CAPSULE | Freq: Every day | ORAL | Status: DC
  Administered 2018-01-13 – 2018-01-15 (×3): 300 mg via ORAL
  Filled 2018-01-13 (×3): qty 1

## 2018-01-13 MED ORDER — DULOXETINE HCL 60 MG PO CPEP
60.0000 mg | ORAL_CAPSULE | Freq: Every day | ORAL | Status: DC
  Administered 2018-01-14 – 2018-01-15 (×2): 60 mg via ORAL
  Filled 2018-01-13 (×2): qty 1

## 2018-01-13 MED ORDER — ACETAMINOPHEN 325 MG PO TABS
650.0000 mg | ORAL_TABLET | Freq: Four times a day (QID) | ORAL | Status: DC | PRN
  Filled 2018-01-13: qty 2

## 2018-01-13 MED ORDER — INSULIN GLARGINE 100 UNIT/ML ~~LOC~~ SOLN
30.0000 [IU] | Freq: Every day | SUBCUTANEOUS | Status: DC
  Administered 2018-01-13 – 2018-01-15 (×3): 30 [IU] via SUBCUTANEOUS
  Filled 2018-01-13 (×3): qty 0.3

## 2018-01-13 MED ORDER — AMIODARONE HCL 200 MG PO TABS
200.0000 mg | ORAL_TABLET | Freq: Every day | ORAL | Status: DC
  Administered 2018-01-14 – 2018-01-15 (×2): 200 mg via ORAL
  Filled 2018-01-13 (×2): qty 1

## 2018-01-13 MED ORDER — ACETAMINOPHEN 650 MG RE SUPP
650.0000 mg | Freq: Four times a day (QID) | RECTAL | Status: DC | PRN
  Filled 2018-01-13: qty 1

## 2018-01-13 NOTE — H&P (Addendum)
Date: 01/13/2018               Patient Name:  Terry Parks MRN: [id number]  DOB: Aug 24, 1955 Age / Sex: 62 y.o., male   PCP: [name], MD         Medical Service: Internal Medicine Teaching Service         Attending Physician: Dr. Oval Linsey, MD    First Contact: Dr. Laural Golden, Natayla Cadenhead Pager: [phone]  Second Contact: Dr. Ina Homes Pager: [phone]       After Hours (After 5p/  First Contact Pager: [phone]  weekends / holidays): Second Contact Pager: [phone]   Chief Complaint: Abdominal distension, N/V  History of Present Illness:  Mr. Bradsher is a 62 y.o male with T2DM, HTN, HFrEF, OSA and prostrate cancer s/p TURP who presented to the clinic today with progressive N/V and abdominal distention of 5-6 days duration. The patient was recently admitted from 8/25 to 8/28. He initially came to the hospital for some numbness/tingling and told it could be related to his heart or a stroke. He was admitted for cardiac and stroke work up, both were negative. His numbness was likely related to his diabetic peripheral neuropathy. He was also found to be febrile with leukocytosis, concerning for possible scrotal cellulitis and UTI. Symptoms worsened on antibiotics with fluctuating fevers. RVP was negative and symptoms resolved. Presumed patient had a self-limited viral illness.   He was seen in the clinic earlier today for a hospital follow up for weakness and fluctuating fevers. He complained of weakness, intractable hiccups, nausea, chills and spitting up "black stuff." On exam, patient had abdominal distension, tenderness and guarding concerning for SBO. CBC showed Hgb wnl, but leukocytosis. Admitted for further workup.  When seen on admission, he reported feeling very weak and reported he has been throwing up since 01/08/18. Describes the emesis as black but no bright red blood. Denied fevers or chills. Endorsed diffuse abdominal pain, cough, hiccups, and decreased PO intake for the  past ten days. He reported loose brown bowel movements and had one today. He is unsure if there has been any blood in the stool. He did notice a streak of blood in his diaper, which he wears due to recent prostatectomy, and attributed it to hemorrhoids. Denied a history of hemorrhoids, diverticulosis/diverticulitis or ulcers. Reported his last colonoscopy about a year ago was normal as far as he knows. Only abdominal surgery is from his prostate resection ~10 weeks ago.    Meds:   No current facility-administered medications on file prior to encounter.    Current Outpatient Medications on File Prior to Encounter  Medication Sig Dispense Refill  . amiodarone (PACERONE) 200 MG tablet Take 1 tablet (200 mg total) by mouth daily. 90 tablet 0  . amLODipine-atorvastatin (CADUET) 5-40 MG tablet Take 1 tablet by mouth daily. (Patient not taking: Reported on 12/20/2017) 90 tablet 3  . atorvastatin (LIPITOR) 40 MG tablet Take 40 mg by mouth daily.  3  . carvedilol (COREG) 12.5 MG tablet Take 1 tablet (12.5 mg total) by mouth 2 (two) times daily with a meal. 180 tablet 3  . DULoxetine (CYMBALTA) 60 MG capsule Take 1 capsule (60 mg total) by mouth daily. 90 capsule 3  . furosemide (LASIX) 40 MG tablet Take 40 mg by mouth 2 (two) times daily.  0  . gabapentin (NEURONTIN) 300 MG capsule Take 1 capsule (300 mg total) by mouth daily. 30 capsule 0  . glucose blood (TRUETRACK TEST)  test strip Use as directed to test blood sugar three times a day before meals. 100 each 1  . Insulin Glargine (BASAGLAR KWIKPEN) 100 UNIT/ML SOPN Inject 0.6 mLs (60 Units total) into the skin daily. 54 mL 3  . Insulin Pen Needle (CARETOUCH PEN NEEDLES) 31G X 6 MM MISC 1 pen by Does not apply route at bedtime. 100 each 3  . isosorbide mononitrate (IMDUR) 30 MG 24 hr tablet Take 1 tablet (30 mg total) by mouth daily. 90 tablet 3  . Lancets MISC Use to test blood sugar three times a day before meals. 100 each 1  . losartan (COZAAR) 50 MG  tablet Take 1 tablet (50 mg total) by mouth daily. 90 tablet 3  . metFORMIN (GLUCOPHAGE-XR) 500 MG 24 hr tablet Take 2 tablets (1,000 mg total) by mouth 2 (two) times daily with a meal. 120 tablet 11  . Misc. Devices MISC by Does not apply route. C-PAP    . nystatin cream (MYCOSTATIN) Apply 1 application topically 2 (two) times daily.    . rivaroxaban (XARELTO) 20 MG TABS tablet Take 20 mg by mouth daily with breakfast.     No outpatient medications have been marked as taking for the 01/13/18 encounter [facility] Encounter).   Allergies: Allergies as of 01/13/2018 - Review Complete 01/13/2018  Allergen Reaction Noted  . Lisinopril Cough 03/30/2012   Past Medical History:  Diagnosis Date  . Abscessed tooth    top back large cavity no pain or drainage, one on bottom  pt pulled tooth 4-5 months ago, right top large hole in tooth  . AKI (acute kidney injury) ([place])   . Allergic rhinitis   . Atrial fibrillation ([place])   . BPH (benign prostatic hypertrophy)    Massive BPH noted on cystoscopy 1/23/ 2012 by Dr. Risa Grill.  . Cardiomyopathy ([place])   . CHF (congestive heart failure) ([place])   . Cough 03/30/2012  . Depression   . Diabetes mellitus 04/08/2008   type 2  . Foley catheter in place 07-05-17 placed  . Headache(784.0)    hx migraines none recent  . Hyperlipemia   . Hypertension   . Hypertensive cardiopathy 03/01/2006   2-D echocardiogram 02/01/2012 showed moderate LVH, mildly to moderately reduced left ventricular systolic function with an estimated ejection fraction of 40-45%, and diffuse hypokinesis.  A nuclear medicine stress study done 01/31/2012 showed no reversible ischemia, a small mid anterior wall fixed defect/infarct, and ejection fraction 42%.      . Neck pain   . Obstructive sleep apnea 03/06/2008   Sleep study 03/06/08 showed severe OSA/hypopnea syndrome, with successful CPAP titration to 13 CWP using a medium ResMed Mirage Quattro full face mask with heated humidifier.   . Rash  04/17/2014  . Renal calculus 05/29/2010   CT scan of abdomen/pelvis on 05/29/2010 showed an obstructing approximate 1-2 mm calculus at the left UVJ, and an approximate 1-2 mm left lower pole renal calculus.   Patient had continuing severe pain , and an elevation of his serum creatinine to a value of 1.75 on 06/06/2010.  The stone had apparently passed and was not seen on repeat CT 06/08/2010.  [name] Tooth pain 10/29/2017  . Urinary straining 11/02/2016   Family History:  Family History  Problem Relation Age of Onset  . Breast cancer Mother   . Hypertension Father   . Diabetes Maternal Grandmother   . Colon cancer Neg Hx   . Prostate cancer Neg Hx   . Heart attack Neg Hx  Social History:  Social History   Socioeconomic History  . Marital status: Single    Spouse name: Not on file  . Number of children: 4 b  . Years of education: 7  . Highest education level: Not on file  Occupational History  . Occupation:        Employer: UNEMPLOYED  Social Needs  . Financial resource strain: Not on file  . Food insecurity:    Worry: Not on file    Inability: Not on file  . Transportation needs:    Medical: Not on file    Non-medical: Not on file  Tobacco Use  . Smoking status: Never Smoker  . Smokeless tobacco: Never Used  Substance and Sexual Activity  . Alcohol use: No    Alcohol/week: 0.0 standard drinks  . Drug use: No  . Sexual activity: Not Currently  Lifestyle  . Physical activity:    Days per week: Not on file    Minutes per session: Not on file  . Stress: Not on file  Relationships  . Social connections:    Talks on phone: Not on file    Gets together: Not on file    Attends religious service: Not on file    Active member of club or organization: Not on file    Attends meetings of clubs or organizations: Not on file    Relationship status: Not on file  . Intimate partner violence:    Fear of current or ex partner: Not on file    Emotionally abused: Not on file     Physically abused: Not on file    Forced sexual activity: Not on file  Other Topics Concern  . Not on file  Social History Narrative   Divorced, 4 children, lives alone.      Review of Systems: A complete ROS was negative except as per HPI.   Physical Exam: Blood pressure (!) 152/91, pulse 100, temperature 98 F (36.7 C), temperature source Oral, resp. rate (!) 21, height 6\' 1"  (1.854 m), weight 129.1 kg, SpO2 100 %.  Physical Exam  Cardiovascular: Normal rate, regular rhythm and normal heart sounds.  No murmur heard. Pulmonary/Chest: Effort normal and breath sounds normal. No respiratory distress. He has no wheezes.  Abdominal: He exhibits distension. There is tenderness. There is no rebound and no guarding.  No bowel sounds appreciated, no fluid wave, RLQ tenderness   Skin: Skin is warm and dry.  Constitutional: very tired on exam, in no acute distress  EKG: n/a  CXR: n/a  Assessment & Plan by Problem: Active Problems:   SBO (small bowel obstruction) [facility])  Mr. Ringer is a 62 y.o male with T2DM, HTN, HFrEF, OSA and prostrate cancer s/p TURP who presented to the clinic today with progressive N/V and abdominal distention of 5-6 days duration.  Small bowel obstruction Patient presented with distended abdomen, RLQ tenderness, nausea and vomiting "black stuff." Hgb wnl, afebrile, leukocytosis 17.8. No bowel sounds appreciated. No signs of acute abdomen. Only recent abdominal surgery, prostatectomy on 10/06/17. Will consider ng tube decompression if persistent N/V. Order occult testing if he continues to vomit "black stuff." Clinical presentation and exam findings concerning for SBO. If abdominal film is negative, proceed with CT abdomen and consider GI panel and KUB.  -- ordered abdominal film -- fluid repletion, LR 500 mL bolus -- monitor vitals -- started zofran -- NPO  AKI Cr 2.08, baseline 1.00. BUN 37. Likely in the setting decreased PO intake and dehydration.  --  LR 500  mL bolus -- monitor bmp  DM A1c from last admission 9.9. Non-adherent to insulin regimen at home; insulin glargine 60 units qd, metformin 1000 mg BID qd. -- started lantus 30 units daily and SSI-resistant TID wc -- continue gabapentin   Atrial fibrillation -- continue home amiodarone and xarelto -- f/u TSH   HTN 152/91 on admission.  -- continue amlodipine 5 mg qd, coreg 12.5 mg BID, lasix 40 mg BID   HFrEF Most recent echo on 8/27 showed normal LV size with mild LV hypertrophy. EF 35-40%, diffuse hypokinesis. Moderate diastolic dysfunction. Mildly dilated RV with mildly decreased systolic function. No significant valvular abnormalities.  -- continue home medication, carvedilol 12.5 mg BID, lasix 40 mg BID, Imdur 30 mg qd,    Diet: NPO VTE Prophylaxis: Pt on Xarelto Code: Full   Dispo: Admit patient to Inpatient with expected length of stay greater than 2 midnights.  SignedMike Craze, DO 01/13/2018, 7:33 PM  [phone]

## 2018-01-13 NOTE — Progress Notes (Addendum)
CC: Generalized weakness  HPI:  [name] is a 62 y.o. with essential hypertension, chronic combined systolic and diastolic heart failure, persistent atrial fibrillation, OSA, hyperlipidemia, type 2 diabetes mellitus, prostate cancer status post TURP who presents for generalized weakness. Please see problem based charting for evaluation, assessment, and plan.  Past Medical History:  Diagnosis Date  . Abscessed tooth    top back large cavity no pain or drainage, one on bottom  pt pulled tooth 4-5 months ago, right top large hole in tooth  . AKI (acute kidney injury) ([place])   . Allergic rhinitis   . Atrial fibrillation ([place])   . BPH (benign prostatic hypertrophy)    Massive BPH noted on cystoscopy 1/23/ 2012 by Dr. Risa Grill.  . Cardiomyopathy ([place])   . CHF (congestive heart failure) ([place])   . Cough 03/30/2012  . Depression   . Diabetes mellitus 04/08/2008   type 2  . Foley catheter in place 07-05-17 placed  . Headache(784.0)    hx migraines none recent  . Hyperlipemia   . Hypertension   . Hypertensive cardiopathy 03/01/2006   2-D echocardiogram 02/01/2012 showed moderate LVH, mildly to moderately reduced left ventricular systolic function with an estimated ejection fraction of 40-45%, and diffuse hypokinesis.  A nuclear medicine stress study done 01/31/2012 showed no reversible ischemia, a small mid anterior wall fixed defect/infarct, and ejection fraction 42%.      . Neck pain   . Obstructive sleep apnea 03/06/2008   Sleep study 03/06/08 showed severe OSA/hypopnea syndrome, with successful CPAP titration to 13 CWP using a medium ResMed Mirage Quattro full face mask with heated humidifier.   . Rash 04/17/2014  . Renal calculus 05/29/2010   CT scan of abdomen/pelvis on 05/29/2010 showed an obstructing approximate 1-2 mm calculus at the left UVJ, and an approximate 1-2 mm left lower pole renal calculus.   Patient had continuing severe pain , and an elevation of his serum creatinine  to a value of 1.75 on 06/06/2010.  The stone had apparently passed and was not seen on repeat CT 06/08/2010.  [name] Tooth pain 10/29/2017  . Urinary straining 11/02/2016   Review of Systems:    Review of Systems  Constitutional: Positive for chills and malaise/fatigue. Negative for diaphoresis, fever and weight loss.  Cardiovascular: Negative for chest pain and leg swelling.  Gastrointestinal: Positive for abdominal pain, nausea and vomiting. Negative for constipation and diarrhea.  Genitourinary: Negative for frequency and hematuria.  Neurological: Negative for dizziness.   Physical Exam:  Vitals:   01/13/18 1621 01/13/18 1622  BP: (!) 147/84 (!) 147/105  Pulse: (!) 55 99  Temp: 97.6 F (36.4 C) 97.8 F (36.6 C)  TempSrc: Oral Oral  SpO2: 90% 99%  Weight: 291 lb (132 kg)   Height: 6\' 1"  (1.854 m)    Physical Exam  Constitutional: He appears well-developed and well-nourished. No distress.  HENT:  Head: Normocephalic and atraumatic.  Eyes: Conjunctivae are normal.  Cardiovascular: Normal rate, regular rhythm and normal heart sounds.  Respiratory: Effort normal and breath sounds normal. No respiratory distress. He has no wheezes.  GI: He exhibits distension. He exhibits no mass. There is tenderness (upper abdomen and umbilical area). There is guarding (around umbilicus).  Musculoskeletal: He exhibits no edema.  Neurological: He is alert.  Skin: He is not diaphoretic. No erythema.  Psychiatric: He has a normal mood and affect. His behavior is normal. Judgment and thought content normal.     Patient's emesis Assessment & Plan:  See Encounters Tab for problem based charting.  Patient discussed with Dr. Rebeca Alert

## 2018-01-13 NOTE — Assessment & Plan Note (Signed)
The patient's blood pressure during this visit was 147/84.  The patient is currently taking amlodipine 5 mg daily, Coreg 12.5 mg twice daily, Lasix 40 mg twice daily.

## 2018-01-13 NOTE — Assessment & Plan Note (Signed)
The patient was recently admitted from 01/08/18-01/11/2018 for a viral upper respiratory infection and diabetic peripheral neuropathy.  On day of discharge the patient had an AKI of 1.68 and a two-point drop in hemoglobin (9.8 from 11.7).   The patient presents to clinic today stating that he has been extremely weak, having intractable hiccups for the past hour, nauseous, spitting up " black stuff", and having chills.  He states that his last bowel movement was yesterday and he does not know if there was any change in his stool color or consistency.  He denies drinking any alcohol or using NSAIDs recently.  He does not have any fevers, dizziness, significant weight changes.  On examination, the patient had abdominal distention, tenderness in upper abdomen and umbilical region (with mild guarding). His vitals remain stable and he is afebrile. He is extremely weak and has not been able to eat a meal in the past week.   Assessment and plan The patient's bilious emesis is concerning for a small bowel obstruction vs small intestinal bacterial overgrowth vs gi bleed.  CBC showed that the patient's hb was normal but he had a leukocytosis. His liver enzymes were normal and he did not have significant elevation in total or direct bilirubin. Unlikely that he has hepatic or chloestatic process.   He will be admitted to inpatient for further workup. He will benefit from npo diet, fluid repletion, ng tube decompression, KUB and possibly CT abdomen. It may also be useful to get a GI panel. Could not start anti-emetic or chlorpromazine in clinic, but informed inpatient team.

## 2018-01-14 ENCOUNTER — Other Ambulatory Visit: Payer: Self-pay

## 2018-01-14 ENCOUNTER — Other Ambulatory Visit (HOSPITAL_COMMUNITY): Payer: Self-pay

## 2018-01-14 ENCOUNTER — Inpatient Hospital Stay (HOSPITAL_COMMUNITY): Payer: Medicaid Other

## 2018-01-14 ENCOUNTER — Encounter (HOSPITAL_COMMUNITY): Payer: Self-pay | Admitting: *Deleted

## 2018-01-14 DIAGNOSIS — E1142 Type 2 diabetes mellitus with diabetic polyneuropathy: Secondary | ICD-10-CM

## 2018-01-14 DIAGNOSIS — Z79899 Other long term (current) drug therapy: Secondary | ICD-10-CM

## 2018-01-14 DIAGNOSIS — G4733 Obstructive sleep apnea (adult) (pediatric): Secondary | ICD-10-CM

## 2018-01-14 DIAGNOSIS — Z8546 Personal history of malignant neoplasm of prostate: Secondary | ICD-10-CM

## 2018-01-14 DIAGNOSIS — Z794 Long term (current) use of insulin: Secondary | ICD-10-CM

## 2018-01-14 DIAGNOSIS — N179 Acute kidney failure, unspecified: Secondary | ICD-10-CM

## 2018-01-14 DIAGNOSIS — Z9079 Acquired absence of other genital organ(s): Secondary | ICD-10-CM

## 2018-01-14 DIAGNOSIS — Z833 Family history of diabetes mellitus: Secondary | ICD-10-CM

## 2018-01-14 DIAGNOSIS — K651 Peritoneal abscess: Secondary | ICD-10-CM

## 2018-01-14 DIAGNOSIS — Z8249 Family history of ischemic heart disease and other diseases of the circulatory system: Secondary | ICD-10-CM

## 2018-01-14 DIAGNOSIS — K56600 Partial intestinal obstruction, unspecified as to cause: Secondary | ICD-10-CM

## 2018-01-14 DIAGNOSIS — Z9114 Patient's other noncompliance with medication regimen: Secondary | ICD-10-CM

## 2018-01-14 DIAGNOSIS — I110 Hypertensive heart disease with heart failure: Secondary | ICD-10-CM

## 2018-01-14 DIAGNOSIS — Z7901 Long term (current) use of anticoagulants: Secondary | ICD-10-CM

## 2018-01-14 DIAGNOSIS — Z888 Allergy status to other drugs, medicaments and biological substances status: Secondary | ICD-10-CM

## 2018-01-14 DIAGNOSIS — I4891 Unspecified atrial fibrillation: Secondary | ICD-10-CM

## 2018-01-14 DIAGNOSIS — I5022 Chronic systolic (congestive) heart failure: Secondary | ICD-10-CM

## 2018-01-14 LAB — BASIC METABOLIC PANEL
ANION GAP: 11 (ref 5–15)
BUN: 38 mg/dL — ABNORMAL HIGH (ref 6–20)
CHLORIDE: 97 mmol/L — AB (ref 98–111)
CO2: 22 mmol/L (ref 22–32)
CREATININE: 1.92 mg/dL — AB (ref 0.61–1.24)
Calcium: 8 mg/dL — ABNORMAL LOW (ref 8.9–10.3)
GFR calc non Af Amer: 36 mL/min — ABNORMAL LOW (ref >60)
GFR, EST AFRICAN AMERICAN: 42 mL/min — AB (ref >60)
Glucose, Bld: 214 mg/dL — ABNORMAL HIGH (ref 70–99)
POTASSIUM: 4.4 mmol/L (ref 3.5–5.1)
SODIUM: 130 mmol/L — AB (ref 135–145)

## 2018-01-14 LAB — GLUCOSE, CAPILLARY
GLUCOSE-CAPILLARY: 152 mg/dL — AB (ref 70–99)
GLUCOSE-CAPILLARY: 169 mg/dL — AB (ref 70–99)
GLUCOSE-CAPILLARY: 118 mg/dL — AB (ref 70–99)
Glucose-Capillary: 198 mg/dL — ABNORMAL HIGH (ref 70–99)
Glucose-Capillary: 158 mg/dL — ABNORMAL HIGH (ref 70–99)

## 2018-01-14 LAB — CBC
HEMATOCRIT: 36.9 % — AB (ref 39.0–52.0)
HEMOGLOBIN: 11.7 g/dL — AB (ref 13.0–17.0)
MCH: 22 pg — AB (ref 26.0–34.0)
MCHC: 31.7 g/dL (ref 30.0–36.0)
MCV: 69.5 fL — AB (ref 78.0–100.0)
Platelets: 326 10*3/uL (ref 150–400)
RBC: 5.31 MIL/uL (ref 4.22–5.81)
RDW: 17 % — ABNORMAL HIGH (ref 11.5–15.5)
WBC: 15.2 10*3/uL — AB (ref 4.0–10.5)

## 2018-01-14 LAB — OCCULT BLOOD GASTRIC / DUODENUM (SPECIMEN CUP): Occult Blood, Gastric: POSITIVE — AB

## 2018-01-14 LAB — TSH: TSH: 1.468 u[IU]/mL (ref 0.350–4.500)

## 2018-01-14 MED ORDER — NYSTATIN 100000 UNIT/GM EX CREA
1.0000 "application " | TOPICAL_CREAM | Freq: Two times a day (BID) | CUTANEOUS | Status: DC
  Administered 2018-01-14 – 2018-01-23 (×19): 1 via TOPICAL
  Filled 2018-01-14: qty 15

## 2018-01-14 MED ORDER — LIDOCAINE HCL 1 % IJ SOLN
INTRAMUSCULAR | Status: AC
  Filled 2018-01-14: qty 20

## 2018-01-14 MED ORDER — ORAL CARE MOUTH RINSE
15.0000 mL | Freq: Two times a day (BID) | OROMUCOSAL | Status: DC
  Administered 2018-01-14 – 2018-01-23 (×19): 15 mL via OROMUCOSAL

## 2018-01-14 MED ORDER — IOHEXOL 300 MG/ML  SOLN
75.0000 mL | Freq: Once | INTRAMUSCULAR | Status: AC | PRN
  Administered 2018-01-14: 75 mL via INTRAVENOUS

## 2018-01-14 MED ORDER — SODIUM CHLORIDE 0.9 % IV SOLN
INTRAVENOUS | Status: AC | PRN
  Administered 2018-01-14: 10 mL/h via INTRAVENOUS

## 2018-01-14 MED ORDER — FENTANYL CITRATE (PF) 100 MCG/2ML IJ SOLN
INTRAMUSCULAR | Status: AC
  Filled 2018-01-14: qty 4

## 2018-01-14 MED ORDER — LACTATED RINGERS IV SOLN
INTRAVENOUS | Status: DC
  Administered 2018-01-14 – 2018-01-17 (×3): via INTRAVENOUS

## 2018-01-14 MED ORDER — FENTANYL CITRATE (PF) 100 MCG/2ML IJ SOLN
INTRAMUSCULAR | Status: AC | PRN
  Administered 2018-01-14 (×2): 50 ug via INTRAVENOUS

## 2018-01-14 MED ORDER — SODIUM CHLORIDE 0.9% FLUSH
5.0000 mL | Freq: Three times a day (TID) | INTRAVENOUS | Status: DC
  Administered 2018-01-14 – 2018-01-24 (×25): 5 mL

## 2018-01-14 MED ORDER — PIPERACILLIN-TAZOBACTAM 3.375 G IVPB
3.3750 g | Freq: Three times a day (TID) | INTRAVENOUS | Status: DC
  Administered 2018-01-14 – 2018-01-15 (×3): 3.375 g via INTRAVENOUS
  Filled 2018-01-14 (×3): qty 50

## 2018-01-14 MED ORDER — NYSTATIN 100000 UNIT/GM EX CREA
1.0000 "application " | TOPICAL_CREAM | Freq: Two times a day (BID) | CUTANEOUS | Status: DC
  Filled 2018-01-14: qty 15

## 2018-01-14 MED ORDER — HEPARIN SODIUM (PORCINE) 5000 UNIT/ML IJ SOLN
5000.0000 [IU] | Freq: Three times a day (TID) | INTRAMUSCULAR | Status: DC
  Administered 2018-01-14 – 2018-01-15 (×3): 5000 [IU] via SUBCUTANEOUS
  Filled 2018-01-14 (×3): qty 1

## 2018-01-14 MED ORDER — MIDAZOLAM HCL 2 MG/2ML IJ SOLN
INTRAMUSCULAR | Status: AC | PRN
  Administered 2018-01-14: 1 mg via INTRAVENOUS

## 2018-01-14 MED ORDER — MIDAZOLAM HCL 2 MG/2ML IJ SOLN
INTRAMUSCULAR | Status: AC
  Filled 2018-01-14: qty 4

## 2018-01-14 NOTE — Consult Note (Signed)
Terry Parks 05-21-1955  [id number].    Requesting MD: Dr. Oval Linsey Chief Complaint/Reason for Consult: intra-abdominal abscesses, SBO  HPI:  This is a 62 yo black male with a history of prostate cancer, s/p robotic prostatectomy in [place], CHF, A fib, HTN, DM, peripheral neuropathy, OSA, who is unable to provide a clear history of events today for the last week.  It is pieced together from his history as well as the chart.  He states he began having some pain in his abdomen on Sunday with some emesis.  He then also states that he was having some pain in his chest as well as numbness of his left arm.  He presented to the ED and was admitted which he left out.  It appears he was worked up for CVA/MI which were negative.  He had a temp of 101.6 and elevated WBC and thought to possibly have scrotal cellulitis.  He was placed on abx therapy for this.  He was ultimately discharged on Wednesday.  I think he was still having some abdominal pain and N/V at this time according to him.  His last BM was Wednesday and isn't sure when he last passed flatus.  He states this BM was normal with no blood present.  His diffuse abdominal pain persisted and he returned to the ED where he had a CT scan that revealed 2 intra-abdominal abscesses around his bladder along with small bowel dilatation c/w possible SBO.  The patient continues to have N/V.  We have been asked to see him for further recommendations.  ROS: ROS: Please see HPI, otherwise no other complaints currently.  Family History  Problem Relation Age of Onset  . Breast cancer Mother   . Hypertension Father   . Diabetes Maternal Grandmother   . Colon cancer Neg Hx   . Prostate cancer Neg Hx   . Heart attack Neg Hx     Past Medical History:  Diagnosis Date  . Abscessed tooth    top back large cavity no pain or drainage, one on bottom  pt pulled tooth 4-5 months ago, right top large hole in tooth  . AKI (acute kidney injury) ([place])   .  Allergic rhinitis   . Atrial fibrillation ([place])   . BPH (benign prostatic hypertrophy)    Massive BPH noted on cystoscopy 1/23/ 2012 by Dr. Risa Grill.  . Cardiomyopathy ([place])   . CHF (congestive heart failure) ([place])   . Cough 03/30/2012  . Depression   . Diabetes mellitus 04/08/2008   type 2  . Foley catheter in place 07-05-17 placed  . Headache(784.0)    hx migraines none recent  . Hyperlipemia   . Hypertension   . Hypertensive cardiopathy 03/01/2006   2-D echocardiogram 02/01/2012 showed moderate LVH, mildly to moderately reduced left ventricular systolic function with an estimated ejection fraction of 40-45%, and diffuse hypokinesis.  A nuclear medicine stress study done 01/31/2012 showed no reversible ischemia, a small mid anterior wall fixed defect/infarct, and ejection fraction 42%.      . Neck pain   . Obstructive sleep apnea 03/06/2008   Sleep study 03/06/08 showed severe OSA/hypopnea syndrome, with successful CPAP titration to 13 CWP using a medium ResMed Mirage Quattro full face mask with heated humidifier.   . Rash 04/17/2014  . Renal calculus 05/29/2010   CT scan of abdomen/pelvis on 05/29/2010 showed an obstructing approximate 1-2 mm calculus at the left UVJ, and an approximate 1-2 mm left lower pole renal calculus.  Patient had continuing severe pain , and an elevation of his serum creatinine to a value of 1.75 on 06/06/2010.  The stone had apparently passed and was not seen on repeat CT 06/08/2010.  [name] Tooth pain 10/29/2017  . Urinary straining 11/02/2016    Past Surgical History:  Procedure Laterality Date  . big toe nails removed Bilateral 20 yrs ago  . CARDIOVERSION N/A 06/08/2017   Procedure: CARDIOVERSION;  Surgeon: Josue Hector, MD;  Location: [facility] ENDOSCOPY;  Service: Cardiovascular;  Laterality: N/A;  . CYSTOSCOPY W/ RETROGRADES    . LYMPHADENECTOMY Bilateral 10/06/2017   Procedure: LYMPHADENECTOMY;  Surgeon: Lucas Mallow, MD;  Location: [facility] ORS;  Service: Urology;   Laterality: Bilateral;  . ROBOT ASSISTED LAPAROSCOPIC RADICAL PROSTATECTOMY N/A 10/06/2017   Procedure: XI ROBOTIC ASSISTED LAPAROSCOPIC RADICAL PROSTATECTOMY;  Surgeon: Lucas Mallow, MD;  Location: [facility] ORS;  Service: Urology;  Laterality: N/A;  . TRANSURETHRAL RESECTION OF BLADDER TUMOR N/A 07/13/2017   Procedure: TRANSURETHRAL RESECTION OF PROSTATE;  Surgeon: Lucas Mallow, MD;  Location: [facility] ORS;  Service: Urology;  Laterality: N/A;    Social History:  reports that he has never smoked. He has never used smokeless tobacco. He reports that he does not drink alcohol or use drugs.  Allergies:  Allergies  Allergen Reactions  . Lisinopril Cough    Medications Prior to Admission  Medication Sig Dispense Refill  . acetaminophen (TYLENOL) 325 MG tablet Take 325-650 mg by mouth every 6 (six) hours as needed (for pain).    [name] atorvastatin (LIPITOR) 40 MG tablet Take 40 mg by mouth daily.  3  . carvedilol (COREG) 12.5 MG tablet Take 1 tablet (12.5 mg total) by mouth 2 (two) times daily with a meal. 180 tablet 3  . DULoxetine (CYMBALTA) 60 MG capsule Take 1 capsule (60 mg total) by mouth daily. 90 capsule 3  . furosemide (LASIX) 40 MG tablet Take 40 mg by mouth 2 (two) times daily.  0  . Insulin Glargine (LANTUS SOLOSTAR) 100 UNIT/ML Solostar Pen Inject 60 Units into the skin daily before breakfast.    . isosorbide mononitrate (IMDUR) 30 MG 24 hr tablet Take 1 tablet (30 mg total) by mouth daily. 90 tablet 3  . metFORMIN (GLUCOPHAGE-XR) 500 MG 24 hr tablet Take 2 tablets (1,000 mg total) by mouth 2 (two) times daily with a meal. 120 tablet 11  . nystatin cream (MYCOSTATIN) Apply 1 application topically See admin instructions. Apply to arms and legs 2 times a day    . rivaroxaban (XARELTO) 20 MG TABS tablet Take 20 mg by mouth daily with breakfast.    . amiodarone (PACERONE) 200 MG tablet Take 1 tablet (200 mg total) by mouth daily. 90 tablet 0  . amLODipine-atorvastatin (CADUET) 5-40 MG  tablet Take 1 tablet by mouth daily. 90 tablet 3  . gabapentin (NEURONTIN) 300 MG capsule Take 1 capsule (300 mg total) by mouth daily. 30 capsule 0  . glucose blood (TRUETRACK TEST) test strip Use as directed to test blood sugar three times a day before meals. 100 each 1  . Insulin Glargine (BASAGLAR KWIKPEN) 100 UNIT/ML SOPN Inject 0.6 mLs (60 Units total) into the skin daily. (Patient not taking: Reported on 01/13/2018) 54 mL 3  . Insulin Pen Needle (CARETOUCH PEN NEEDLES) 31G X 6 MM MISC 1 pen by Does not apply route at bedtime. 100 each 3  . Lancets MISC Use to test blood sugar three times a day before meals.  100 each 1  . losartan (COZAAR) 50 MG tablet Take 1 tablet (50 mg total) by mouth daily. 90 tablet 3  . Misc. Devices MISC by Does not apply route. C-PAP       Physical Exam: Blood pressure (!) 154/89, pulse 100, temperature 97.8 F (36.6 C), temperature source Oral, resp. rate 20, height '6\' 1"'  (1.854 m), weight 129.1 kg, SpO2 100 %. General: obese, black male who is sitting up in his chair with constant hiccups and seems a bit drowsy HEENT: head is normocephalic, atraumatic.  Sclera are noninjected.  PERRL.  Ears and nose without any masses or lesions.  Mouth is pink and dry Heart: regular rhythm, but tachycardic.  Normal s1,s2. No obvious murmurs, gallops, or rubs noted.  Palpable radial and pedal pulses bilaterally Lungs: CTAB, no wheezes, rhonchi, or rales noted.  Respiratory effort nonlabored Abd: soft, tenderness greatest periumbilically and in the mid portion of his abdomen, obese, but some distention present, hypoactive BS, no masses, hernias, or organomegaly.  Old scar noted from prostatectomy MS: all 4 extremities are symmetrical with no cyanosis, clubbing. Mild LE edema present Skin: warm and dry with no masses, lesions, or rashes Psych: A&Ox3 but poor historian and seems drowsy.   Results for orders placed or performed during the hospital encounter of 01/13/18 (from the  past 48 hour(s))  Glucose, capillary     Status: Abnormal   Collection Time: 01/13/18  9:04 PM  Result Value Ref Range   Glucose-Capillary 316 (H) 70 - 99 mg/dL  Occult blood gastric / duodenum     Status: Abnormal   Collection Time: 01/14/18  7:07 AM  Result Value Ref Range   Occult Blood, Gastric POSITIVE (A) NEGATIVE    Comment: Performed at [facility], [place] [address] [place], [place] [address]  Basic metabolic panel     Status: Abnormal   Collection Time: 01/14/18  8:01 AM  Result Value Ref Range   Sodium 130 (L) 135 - 145 mmol/L   Potassium 4.4 3.5 - 5.1 mmol/L   Chloride 97 (L) 98 - 111 mmol/L   CO2 22 22 - 32 mmol/L   Glucose, Bld 214 (H) 70 - 99 mg/dL   BUN 38 (H) 6 - 20 mg/dL   Creatinine, Ser 1.92 (H) 0.61 - 1.24 mg/dL   Calcium 8.0 (L) 8.9 - 10.3 mg/dL   GFR calc non Af Amer 36 (L) >60 mL/min   GFR calc Af Amer 42 (L) >60 mL/min    Comment: (NOTE) The eGFR has been calculated using the CKD EPI equation. This calculation has not been validated in all clinical situations. eGFR's persistently <60 mL/min signify possible Chronic Kidney Disease.    Anion gap 11 5 - 15    Comment: Performed at [facility] [address], [place], [place] [address]  CBC     Status: Abnormal   Collection Time: 01/14/18  8:01 AM  Result Value Ref Range   WBC 15.2 (H) 4.0 - 10.5 K/uL   RBC 5.31 4.22 - 5.81 MIL/uL   Hemoglobin 11.7 (L) 13.0 - 17.0 g/dL   HCT 36.9 (L) 39.0 - 52.0 %   MCV 69.5 (L) 78.0 - 100.0 fL   MCH 22.0 (L) 26.0 - 34.0 pg   MCHC 31.7 30.0 - 36.0 g/dL   RDW 17.0 (H) 11.5 - 15.5 %   Platelets 326 150 - 400 K/uL    Comment: Performed at [facility], [place]  [address], [place], [place] [address]  Glucose, capillary     Status: Abnormal   Collection Time: 01/14/18  8:06 AM  Result Value Ref Range   Glucose-Capillary 198 (H) 70 - 99 mg/dL   Dg Abd 1 View  Result Date: 01/13/2018 CLINICAL DATA:  SBO  Image sitting upright per MD order EXAM: ABDOMEN -  1 VIEW COMPARISON:  CT of the abdomen and pelvis on 06/07/2010 FINDINGS: Single view of the abdomen demonstrates normal bowel gas pattern. There is no evidence for free intraperitoneal air. The pelvis is not imaged. IMPRESSION: No evidence for acute  abnormality. Electronically Signed   By: Nolon Nations M.D.   On: 01/13/2018 20:39   Ct Abdomen Pelvis W Contrast  Result Date: 01/14/2018 CLINICAL DATA:  62 year old male with abdominal pain and vomiting. History of prostate cancer. Abdominal distension. Concern for obstruction. EXAM: CT ABDOMEN AND PELVIS WITH CONTRAST TECHNIQUE: Multidetector CT imaging of the abdomen and pelvis was performed using the standard protocol following bolus administration of intravenous contrast. CONTRAST:  63m OMNIPAQUE IOHEXOL 300 MG/ML  SOLN COMPARISON:  CT of the abdomen pelvis dated 06/07/2010 FINDINGS: Lower chest: The visualized lung bases are clear. There is mild cardiomegaly. No intra-abdominal free air.  Small perihepatic free fluid. Hepatobiliary: No focal liver abnormality is seen. No gallstones, gallbladder wall thickening, or biliary dilatation. Pancreas: Unremarkable. No pancreatic ductal dilatation or surrounding inflammatory changes. Spleen: Normal in size without focal abnormality. Adrenals/Urinary Tract: The adrenal glands, kidneys, and the visualized ureters appear unremarkable. Thickened appearance of the bladder wall likely partly related to underdistention and partly related to inflammatory changes/abscesses within the pelvis. Stomach/Bowel: Mildly dilated fluid-filled loops of small bowel measure up to 5 cm in caliber. There is a focal area of apparent tethering of small bowel loop in the mid abdomen (series 6 image 58 and series 3, image 46) likely representing adhesions. The distal small bowel and the terminal ileum are collapsed. There is thickened appearance of the sigmoid colon likely reactive to inflammatory changes and abscess of the pelvis. The  appendix is unremarkable as visualized. Vascular/Lymphatic: Mild aortoiliac atherosclerotic disease. The IVC is unremarkable. No portal venous gas. There is no adenopathy. Reproductive: Prostatectomy.  No pelvic mass. Other: There is a 12 x 6 x 7 cm loculated fluid collection with organizing walls and surrounding inflammatory changes anterior and superior to the urinary bladder. This collection appears to communicate with a more superior smaller collection which measures 5 x 5 x 3 cm. Findings most consistent with infected fluid collections or abscesses of indeterminate etiology. The possibilities include previously perforated appendicitis. Diverticulitis as the cause of the abscess is less likely as no definite diverticula is identified. Clinical correlation is recommended. Musculoskeletal: There is diastases of anterior abdominal wall musculature at the level of the umbilicus with a small fat containing umbilical hernia. There is stranding of the herniated fat likely reactive to inflammatory changes of the pelvis. No acute osseous pathology. IMPRESSION: 1. Pelvic abscesses of indeterminate etiology with surrounding inflammatory changes within the pelvis. Clinical correlation is recommended. 2. Small bowel obstruction likely secondary to adhesions in the mid abdomen. Electronically Signed   By: AAnner CreteM.D.   On: 01/14/2018 03:28      Assessment/Plan Intra-abdominal abscesses, unclear etiology Unclear cause of these fluid collections.  His story is not typical for a history of appendicitis or diverticulitis.  No diverticula are seen on his CT scan.  His last colonoscopy was in 2011 by Dr. BOlevia Perchesand was normal.  For now, we will ask IR to evaluate the patient for placement of perc drain(s) for these collections and start him on Zosyn.  He is on Xarelto for his A fib.  Medicine is going to hold this and start him on Heparin.  SBO secondary to above The patient appears to have an SBO likely  secondary to inflammatory response from his intra-abdominal infection.  He needs and NGT to be placed for comfort and decompression.  CHF/cardiomyopathy HTN DM Peripheral neuropathy OSA AKI H/O prostate cancer FEN - NPO/NGT/IVFs VTE - SCDs/heparin ID - [email or web address] 8/31 --[place], [facility] Surgery 01/14/2018, 10:38 AM Pager: [phone]

## 2018-01-14 NOTE — Plan of Care (Signed)
  Problem: Activity: Goal: Risk for activity intolerance will decrease Outcome: Progressing   Problem: Nutrition: Goal: Adequate nutrition will be maintained Outcome: Progressing   Problem: Elimination: Goal: Will not experience complications related to bowel motility Outcome: Progressing   Problem: Pain Managment: Goal: General experience of comfort will improve Outcome: Progressing   Problem: Skin Integrity: Goal: Risk for impaired skin integrity will decrease Outcome: Progressing   

## 2018-01-14 NOTE — Procedures (Signed)
Pelvic abscess  S/P ct DRAIN 185CC PUS ASPIRATED No comp Stable EBL min cx sent Full report in pacs

## 2018-01-14 NOTE — Progress Notes (Addendum)
   Subjective: Mr. Mcdonnell reported feeling the same this morning. He was in distress due to hiccups that were causing him chest pain. He felt like he needed to go to the bathroom but denied any bowel movements since he was admitted. He said he is urinating okay. He said he was in more pain due to his hiccups than his abdomen. He denied any overnight fever, chills. Endorsed N/V. He was requesting a diet and said his mouth felt very dry. CT findings were explained to him. All questions and concerns were addressed.   Objective:  Vital signs in last 24 hours: Vitals:   01/13/18 1844 01/13/18 2059 01/13/18 2226 01/14/18 0500  BP: (!) 152/91 (!) 155/96 (!) 155/96 (!) 154/89  Pulse: 100 (!) 105 (!) 105 100  Resp: (!) 21 18  20   Temp: 98 F (36.7 C) 97.7 F (36.5 C)  97.8 F (36.6 C)  TempSrc: Oral Oral  Oral  SpO2: 100% 98%  100%  Weight: 129.1 kg     Height: 6\' 1"  (1.854 m)      Physical Exam  Constitutional: He appears distressed.  Cardiovascular: Normal rate, regular rhythm and normal heart sounds.  No murmur heard. Pulmonary/Chest: Effort normal and breath sounds normal. No respiratory distress.  Abdominal: He exhibits distension. There is no tenderness. There is no guarding.  No bowel sounds appreciated  Musculoskeletal: He exhibits edema.  Skin: Skin is warm and dry.    Assessment/Plan:  Active Problems:   SBO (small bowel obstruction) (H[facility])   Pelvic abscess in male [facility])  Mr. Belcher is a 62 y.o male with T2DM, HTN, HFrEF, OSA and prostrate cancer s/p TURP who presented to the clinic today with progressive N/V and abdominal distention of 5-6 days duration.   Small bowel obstruction Pelvic abcesses CT findings illustrated SBO likely 2/2 adhesions in the mid abdomen. Also showed a loculated 12 x 6 x 7 cm fluid collection anterior and superior to the urinary bladder with a communicating smaller collection superiorly. Findings most likely abscesses of indeterminate etiology.  On exam, still distended, no bowel sounds appreciated and no BM. WBC decreased from 17.8 to 15.2. Occult blood test positive, Hgb stable, hemodynamically stable; most likely mucosal tears from vomiting. Surgery consulted.  -- f/u with surgery  -- consider NGT if N/V continues -- fluid repletion LR 75 ml/hr  -- monitor vitals -- started zofran -- NPO  AKI Cr 1.92, baseline 1.00. BUN 38. Likely in the setting decreased PO intake and dehydration. Careful fluid repletion due to CHF.  -- LR 75 ml/hr -- monitor bmp  DM CBG 198 -- continue lantus 30 units daily and SSI-resistant TID wc -- continue gabapentin   Atrial fibrillation -- continue home amiodarone and xarelto -- TSH wnl  HTN 154/89. -- continue amlodipine 5 mg qd, coreg 12.5 mg BID, lasix 40 mg BID   HFrEF -- continue home medication, carvedilol 12.5 mg BID, lasix 40 mg BID, Imdur 30 mg qd -- monitor volume status    Diet: NPO VTE Prophylaxis: Pt on Xarelto Code: Full   Dispo: Discharge is pending on resolution of SBO and determining source of pelvic abscesses.   Mike Craze, DO 01/14/2018, 9:49 AM Pager: [phone]

## 2018-01-14 NOTE — Progress Notes (Signed)
Chief Complaint: Patient was seen in consultation today for abscess drain at the request of Saverio Danker PA-C  Referring Physician(s): Saverio Danker PA-C  Supervising Physician: Daryll Brod  Patient Status: [facility] - [facility]  History of Present Illness: Terry Parks is a 62 y.o. male who had robotic prostatectomy in May. He now has abd pain and his workup finds abdominal abscesses with associated SBO. He has been admitted. Surgery team request IR place perc drain(s) PMHx, meds, labs, imaging, allergies reviewed. Normally on Xarelto for A.fib but has not had it since Thursday.    Past Medical History:  Diagnosis Date  . Abscessed tooth    top back large cavity no pain or drainage, one on bottom  pt pulled tooth 4-5 months ago, right top large hole in tooth  . AKI (acute kidney injury) ([place])   . Allergic rhinitis   . Atrial fibrillation ([place])   . BPH (benign prostatic hypertrophy)    Massive BPH noted on cystoscopy 1/23/ 2012 by Dr. Risa Grill.  . Cardiomyopathy ([place])   . CHF (congestive heart failure) ([place])   . Cough 03/30/2012  . Depression   . Diabetes mellitus 04/08/2008   type 2  . Foley catheter in place 07-05-17 placed  . Headache(784.0)    hx migraines none recent  . Hyperlipemia   . Hypertension   . Hypertensive cardiopathy 03/01/2006   2-D echocardiogram 02/01/2012 showed moderate LVH, mildly to moderately reduced left ventricular systolic function with an estimated ejection fraction of 40-45%, and diffuse hypokinesis.  A nuclear medicine stress study done 01/31/2012 showed no reversible ischemia, a small mid anterior wall fixed defect/infarct, and ejection fraction 42%.      . Neck pain   . Obstructive sleep apnea 03/06/2008   Sleep study 03/06/08 showed severe OSA/hypopnea syndrome, with successful CPAP titration to 13 CWP using a medium ResMed Mirage Quattro full face mask with heated humidifier.   . Rash 04/17/2014  . Renal calculus 05/29/2010   CT scan of  abdomen/pelvis on 05/29/2010 showed an obstructing approximate 1-2 mm calculus at the left UVJ, and an approximate 1-2 mm left lower pole renal calculus.   Patient had continuing severe pain , and an elevation of his serum creatinine to a value of 1.75 on 06/06/2010.  The stone had apparently passed and was not seen on repeat CT 06/08/2010.  [name] Tooth pain 10/29/2017  . Urinary straining 11/02/2016    Past Surgical History:  Procedure Laterality Date  . big toe nails removed Bilateral 20 yrs ago  . CARDIOVERSION N/A 06/08/2017   Procedure: CARDIOVERSION;  Surgeon: Josue Hector, MD;  Location: [facility] ENDOSCOPY;  Service: Cardiovascular;  Laterality: N/A;  . CYSTOSCOPY W/ RETROGRADES    . LYMPHADENECTOMY Bilateral 10/06/2017   Procedure: LYMPHADENECTOMY;  Surgeon: Lucas Mallow, MD;  Location: [facility] ORS;  Service: Urology;  Laterality: Bilateral;  . ROBOT ASSISTED LAPAROSCOPIC RADICAL PROSTATECTOMY N/A 10/06/2017   Procedure: XI ROBOTIC ASSISTED LAPAROSCOPIC RADICAL PROSTATECTOMY;  Surgeon: Lucas Mallow, MD;  Location: [facility] ORS;  Service: Urology;  Laterality: N/A;  . TRANSURETHRAL RESECTION OF BLADDER TUMOR N/A 07/13/2017   Procedure: TRANSURETHRAL RESECTION OF PROSTATE;  Surgeon: Lucas Mallow, MD;  Location: [facility] ORS;  Service: Urology;  Laterality: N/A;    Allergies: Lisinopril  Medications: Prior to Admission medications   Medication Sig Start Date End Date Taking? Authorizing Provider  acetaminophen (TYLENOL) 325 MG tablet Take 325-650 mg by mouth every 6 (six) hours as needed (for  pain).   Yes [provider]  atorvastatin (LIPITOR) 40 MG tablet Take 40 mg by mouth daily. 10/14/17  Yes [provider]  carvedilol (COREG) 12.5 MG tablet Take 1 tablet (12.5 mg total) by mouth 2 (two) times daily with a meal. 10/14/17 01/13/18 Yes [name], Vahini, MD  DULoxetine (CYMBALTA) 60 MG capsule Take 1 capsule (60 mg total) by mouth daily. 10/14/17 10/14/18 Yes [name], Vahini, MD    furosemide (LASIX) 40 MG tablet Take 40 mg by mouth 2 (two) times daily. 01/03/18  Yes [provider]  Insulin Glargine (LANTUS SOLOSTAR) 100 UNIT/ML Solostar Pen Inject 60 Units into the skin daily before breakfast.   Yes [provider]  isosorbide mononitrate (IMDUR) 30 MG 24 hr tablet Take 1 tablet (30 mg total) by mouth daily. 07/06/17  Yes Forde Dandy, PharmD  metFORMIN (GLUCOPHAGE-XR) 500 MG 24 hr tablet Take 2 tablets (1,000 mg total) by mouth 2 (two) times daily with a meal. 01/13/18  Yes Forde Dandy, PharmD  nystatin cream (MYCOSTATIN) Apply 1 application topically See admin instructions. Apply to arms and legs 2 times a day   Yes [provider]  rivaroxaban (XARELTO) 20 MG TABS tablet Take 20 mg by mouth daily with breakfast.   Yes [provider]  amiodarone (PACERONE) 200 MG tablet Take 1 tablet (200 mg total) by mouth daily. 10/14/17 01/13/18  [name], [name], MD  amLODipine-atorvastatin (CADUET) 5-40 MG tablet Take 1 tablet by mouth daily. 10/14/17   [name], [name], MD  gabapentin (NEURONTIN) 300 MG capsule Take 1 capsule (300 mg total) by mouth daily. 10/14/17 01/13/18  [name], [name], MD  glucose blood (TRUETRACK TEST) test strip Use as directed to test blood sugar three times a day before meals. 10/04/12   Bertha Stakes, MD  Insulin Glargine (BASAGLAR KWIKPEN) 100 UNIT/ML SOPN Inject 0.6 mLs (60 Units total) into the skin daily. Patient not taking: Reported on 01/13/2018 01/11/18   [name], MD  Insulin Pen Needle (CARETOUCH PEN NEEDLES) 31G X 6 MM MISC 1 pen by Does not apply route at bedtime. 03/01/17   [name], MD  Lancets MISC Use to test blood sugar three times a day before meals. 09/14/13   Malena Catholic, MD  losartan (COZAAR) 50 MG tablet Take 1 tablet (50 mg total) by mouth daily. 10/14/17   [name], MD  Misc. Devices MISC by Does not apply route. C-PAP    [provider]     Family History  Problem  Relation Age of Onset  . Breast cancer Mother   . Hypertension Father   . Diabetes Maternal Grandmother   . Colon cancer Neg Hx   . Prostate cancer Neg Hx   . Heart attack Neg Hx     Social History   Socioeconomic History  . Marital status: Single    Spouse name: Not on file  . Number of children: 4 b  . Years of education: 75  . Highest education level: Not on file  Occupational History  . Occupation:        Employer: UNEMPLOYED  Social Needs  . Financial resource strain: Not on file  . Food insecurity:    Worry: Not on file    Inability: Not on file  . Transportation needs:    Medical: Not on file    Non-medical: Not on file  Tobacco Use  . Smoking status: Never Smoker  . Smokeless tobacco: Never Used  Substance and Sexual Activity  .  Alcohol use: No    Alcohol/week: 0.0 standard drinks  . Drug use: No  . Sexual activity: Not Currently  Lifestyle  . Physical activity:    Days per week: Not on file    Minutes per session: Not on file  . Stress: Not on file  Relationships  . Social connections:    Talks on phone: Not on file    Gets together: Not on file    Attends religious service: Not on file    Active member of club or organization: Not on file    Attends meetings of clubs or organizations: Not on file    Relationship status: Not on file  Other Topics Concern  . Not on file  Social History Narrative   Divorced, 4 children, lives alone.       Review of Systems: A 12 point ROS discussed and pertinent positives are indicated in the HPI above.  All other systems are negative.  Review of Systems  Vital Signs: BP (!) 154/89 (BP Location: Right Arm)   Pulse 100   Temp 97.8 F (36.6 C) (Oral)   Resp 20   Ht 6\' 1"  (1.854 m)   Wt 129.1 kg   SpO2 100%   BMI 37.55 kg/m   Physical Exam  Constitutional: He is oriented to person, place, and time. He appears well-developed. No distress.  HENT:  Head: Normocephalic.  Mouth/Throat: Oropharynx is clear and  moist.  Neck: Normal range of motion. No JVD present.  Cardiovascular: Normal rate, regular rhythm and normal heart sounds.  Pulmonary/Chest: Effort normal and breath sounds normal. No respiratory distress.  Abdominal: Soft. He exhibits no mass. There is tenderness.  Tender lower abd  Neurological: He is alert and oriented to person, place, and time.  Skin: Skin is warm and dry.  Psychiatric: He has a normal mood and affect.    Imaging: Ct Angio Head W Or Wo Contrast  Result Date: 01/09/2018 CLINICAL DATA:  Left-sided numbness EXAM: CT ANGIOGRAPHY HEAD AND NECK TECHNIQUE: Multidetector CT imaging of the head and neck was performed using the standard protocol during bolus administration of intravenous contrast. Multiplanar CT image reconstructions and MIPs were obtained to evaluate the vascular anatomy. Carotid stenosis measurements (when applicable) are obtained utilizing NASCET criteria, using the distal internal carotid diameter as the denominator. CONTRAST:  11mL ISOVUE-370 IOPAMIDOL (ISOVUE-370) INJECTION 76% COMPARISON:  Head CT 01/08/2018 FINDINGS: CT HEAD FINDINGS Brain: There is no mass, hemorrhage or extra-axial collection. The size and configuration of the ventricles and extra-axial CSF spaces are normal. There is no acute or chronic infarction. The brain parenchyma is normal. Skull: The visualized skull base, calvarium and extracranial soft tissues are normal. Sinuses/Orbits: No fluid levels or advanced mucosal thickening of the visualized paranasal sinuses. No mastoid or middle ear effusion. The orbits are normal. CTA NECK FINDINGS AORTIC ARCH: There is no calcific atherosclerosis of the aortic arch. There is no aneurysm, dissection or hemodynamically significant stenosis of the visualized ascending aorta and aortic arch. Normal variant aortic arch branching pattern with the brachiocephalic and left common carotid arteries sharing a common origin. The visualized proximal subclavian arteries  are widely patent. RIGHT CAROTID SYSTEM: --Common carotid artery: Widely patent origin without common carotid artery dissection or aneurysm. --Internal carotid artery: No dissection, occlusion or aneurysm. No hemodynamically significant stenosis. --External carotid artery: No acute abnormality. LEFT CAROTID SYSTEM: --Common carotid artery: There is noncalcified atherosclerotic plaque in the left common carotid artery without hemodynamically significant stenosis by NASCET criteria. --  Internal carotid artery:No dissection, occlusion or aneurysm. No hemodynamically significant stenosis. --External carotid artery: No acute abnormality. VERTEBRAL ARTERIES: Left dominant configuration. Both origins are normal. No dissection, occlusion or flow-limiting stenosis to the vertebrobasilar confluence. SKELETON: There is no bony spinal canal stenosis. No lytic or blastic lesion. OTHER NECK: Normal pharynx, larynx and major salivary glands. No cervical lymphadenopathy. Unremarkable thyroid gland. UPPER CHEST: No pneumothorax or pleural effusion. No nodules or masses. CTA HEAD FINDINGS ANTERIOR CIRCULATION: --Intracranial internal carotid arteries: Normal. --Anterior cerebral arteries: Normal. Both A1 segments are present. Patent anterior communicating artery. --Middle cerebral arteries: Normal. --Posterior communicating arteries: Present on the left, absent on the right. POSTERIOR CIRCULATION: --Basilar artery: Normal. --Posterior cerebral arteries: Normal. --Superior cerebellar arteries: Normal. --Inferior cerebellar arteries: Normal anterior and posterior inferior cerebellar arteries. VENOUS SINUSES: As permitted by contrast timing, patent. ANATOMIC VARIANTS: None DELAYED PHASE: No parenchymal contrast enhancement. Review of the MIP images confirms the above findings. IMPRESSION: 1. No emergent large vessel occlusion or hemodynamically significant stenosis. 2. No acute abnormality of the head or neck. 3. Atherosclerotic plaque  in the left common carotid artery without significant stenosis. Electronically Signed   By: Ulyses Jarred M.D.   On: 01/09/2018 17:22   Dg Chest 2 View  Result Date: 01/08/2018 CLINICAL DATA:  Fever, left-sided numbness EXAM: CHEST - 2 VIEW COMPARISON:  06/06/2017 chest radiograph. FINDINGS: Stable cardiomediastinal silhouette with mild cardiomegaly. No pneumothorax. No pleural effusion. Lungs appear clear, with no acute consolidative airspace disease and no pulmonary edema. IMPRESSION: Stable mild cardiomegaly without overt pulmonary edema. No active pulmonary disease. Electronically Signed   By: Ilona Sorrel M.D.   On: 01/08/2018 10:00   Dg Abd 1 View  Result Date: 01/13/2018 CLINICAL DATA:  SBO  Image sitting upright per MD order EXAM: ABDOMEN - 1 VIEW COMPARISON:  CT of the abdomen and pelvis on 06/07/2010 FINDINGS: Single view of the abdomen demonstrates normal bowel gas pattern. There is no evidence for free intraperitoneal air. The pelvis is not imaged. IMPRESSION: No evidence for acute  abnormality. Electronically Signed   By: Nolon Nations M.D.   On: 01/13/2018 20:39   Ct Head Wo Contrast  Result Date: 01/08/2018 CLINICAL DATA:  Left-sided numbness beginning last night. EXAM: CT HEAD WITHOUT CONTRAST TECHNIQUE: Contiguous axial images were obtained from the base of the skull through the vertex without intravenous contrast. COMPARISON:  11/08/2017.  02/21/2007. FINDINGS: Brain: The brain shows a normal appearance without evidence of malformation, atrophy, old or acute small or large vessel infarction, mass lesion, hemorrhage, hydrocephalus or extra-axial collection. Vascular: No hyperdense vessel. No evidence of atherosclerotic calcification. Skull: Normal.  No traumatic finding.  No focal bone lesion. Sinuses/Orbits: Sinuses are clear. Orbits appear normal. Mastoids are clear. Other: None significant IMPRESSION: Normal head CT Electronically Signed   By: Nelson Chimes M.D.   On: 01/08/2018  11:21   Ct Angio Neck W Or Wo Contrast  Result Date: 01/09/2018 CLINICAL DATA:  Left-sided numbness EXAM: CT ANGIOGRAPHY HEAD AND NECK TECHNIQUE: Multidetector CT imaging of the head and neck was performed using the standard protocol during bolus administration of intravenous contrast. Multiplanar CT image reconstructions and MIPs were obtained to evaluate the vascular anatomy. Carotid stenosis measurements (when applicable) are obtained utilizing NASCET criteria, using the distal internal carotid diameter as the denominator. CONTRAST:  36mL ISOVUE-370 IOPAMIDOL (ISOVUE-370) INJECTION 76% COMPARISON:  Head CT 01/08/2018 FINDINGS: CT HEAD FINDINGS Brain: There is no mass, hemorrhage or extra-axial collection. The size and configuration  of the ventricles and extra-axial CSF spaces are normal. There is no acute or chronic infarction. The brain parenchyma is normal. Skull: The visualized skull base, calvarium and extracranial soft tissues are normal. Sinuses/Orbits: No fluid levels or advanced mucosal thickening of the visualized paranasal sinuses. No mastoid or middle ear effusion. The orbits are normal. CTA NECK FINDINGS AORTIC ARCH: There is no calcific atherosclerosis of the aortic arch. There is no aneurysm, dissection or hemodynamically significant stenosis of the visualized ascending aorta and aortic arch. Normal variant aortic arch branching pattern with the brachiocephalic and left common carotid arteries sharing a common origin. The visualized proximal subclavian arteries are widely patent. RIGHT CAROTID SYSTEM: --Common carotid artery: Widely patent origin without common carotid artery dissection or aneurysm. --Internal carotid artery: No dissection, occlusion or aneurysm. No hemodynamically significant stenosis. --External carotid artery: No acute abnormality. LEFT CAROTID SYSTEM: --Common carotid artery: There is noncalcified atherosclerotic plaque in the left common carotid artery without  hemodynamically significant stenosis by NASCET criteria. --Internal carotid artery:No dissection, occlusion or aneurysm. No hemodynamically significant stenosis. --External carotid artery: No acute abnormality. VERTEBRAL ARTERIES: Left dominant configuration. Both origins are normal. No dissection, occlusion or flow-limiting stenosis to the vertebrobasilar confluence. SKELETON: There is no bony spinal canal stenosis. No lytic or blastic lesion. OTHER NECK: Normal pharynx, larynx and major salivary glands. No cervical lymphadenopathy. Unremarkable thyroid gland. UPPER CHEST: No pneumothorax or pleural effusion. No nodules or masses. CTA HEAD FINDINGS ANTERIOR CIRCULATION: --Intracranial internal carotid arteries: Normal. --Anterior cerebral arteries: Normal. Both A1 segments are present. Patent anterior communicating artery. --Middle cerebral arteries: Normal. --Posterior communicating arteries: Present on the left, absent on the right. POSTERIOR CIRCULATION: --Basilar artery: Normal. --Posterior cerebral arteries: Normal. --Superior cerebellar arteries: Normal. --Inferior cerebellar arteries: Normal anterior and posterior inferior cerebellar arteries. VENOUS SINUSES: As permitted by contrast timing, patent. ANATOMIC VARIANTS: None DELAYED PHASE: No parenchymal contrast enhancement. Review of the MIP images confirms the above findings. IMPRESSION: 1. No emergent large vessel occlusion or hemodynamically significant stenosis. 2. No acute abnormality of the head or neck. 3. Atherosclerotic plaque in the left common carotid artery without significant stenosis. Electronically Signed   By: Ulyses Jarred M.D.   On: 01/09/2018 17:22   Mr Brain Wo Contrast  Result Date: 01/08/2018 CLINICAL DATA:  62 y/o M; left upper and lower extremity numbness constant since yesterday morning. EXAM: MRI HEAD WITHOUT CONTRAST TECHNIQUE: Multiplanar, multiecho pulse sequences of the brain and surrounding structures were obtained without  intravenous contrast. COMPARISON:  01/08/2018 CT head. FINDINGS: Brain: No acute infarction, hemorrhage, hydrocephalus, extra-axial collection or mass lesion. Few nonspecific T2 FLAIR hyperintensities in predominantly in bilateral parietal periventricular white matter are compatible with mild chronic microvascular ischemic changes for age. Mild volume loss of the brain. Symmetric nonspecific globus pallidus increased T2 and decreased T1 signal (series 10, image 12 and series 14, image 26). No associated reduced diffusion. Vascular: Normal flow voids. Skull and upper cervical spine: Normal marrow signal. Sinuses/Orbits: Negative. Other: None. IMPRESSION: 1. No acute intracranial abnormality identified. 2. Symmetric nonspecific globus pallidus lesions with increased T2 and decreased T1 signal. Some differential considerations include chronic sequelae of hypoxic ischemic injury, carbon monoxide poisoning, or toxic encephalopathy (notably heroin, MDMA). 3. Mild chronic microvascular ischemic changes and volume loss of the brain. Electronically Signed   By: Kristine Garbe M.D.   On: 01/08/2018 23:34   Ct Abdomen Pelvis W Contrast  Result Date: 01/14/2018 CLINICAL DATA:  62 year old male with abdominal pain and vomiting. History  of prostate cancer. Abdominal distension. Concern for obstruction. EXAM: CT ABDOMEN AND PELVIS WITH CONTRAST TECHNIQUE: Multidetector CT imaging of the abdomen and pelvis was performed using the standard protocol following bolus administration of intravenous contrast. CONTRAST:  84mL OMNIPAQUE IOHEXOL 300 MG/ML  SOLN COMPARISON:  CT of the abdomen pelvis dated 06/07/2010 FINDINGS: Lower chest: The visualized lung bases are clear. There is mild cardiomegaly. No intra-abdominal free air.  Small perihepatic free fluid. Hepatobiliary: No focal liver abnormality is seen. No gallstones, gallbladder wall thickening, or biliary dilatation. Pancreas: Unremarkable. No pancreatic ductal  dilatation or surrounding inflammatory changes. Spleen: Normal in size without focal abnormality. Adrenals/Urinary Tract: The adrenal glands, kidneys, and the visualized ureters appear unremarkable. Thickened appearance of the bladder wall likely partly related to underdistention and partly related to inflammatory changes/abscesses within the pelvis. Stomach/Bowel: Mildly dilated fluid-filled loops of small bowel measure up to 5 cm in caliber. There is a focal area of apparent tethering of small bowel loop in the mid abdomen (series 6 image 58 and series 3, image 46) likely representing adhesions. The distal small bowel and the terminal ileum are collapsed. There is thickened appearance of the sigmoid colon likely reactive to inflammatory changes and abscess of the pelvis. The appendix is unremarkable as visualized. Vascular/Lymphatic: Mild aortoiliac atherosclerotic disease. The IVC is unremarkable. No portal venous gas. There is no adenopathy. Reproductive: Prostatectomy.  No pelvic mass. Other: There is a 12 x 6 x 7 cm loculated fluid collection with organizing walls and surrounding inflammatory changes anterior and superior to the urinary bladder. This collection appears to communicate with a more superior smaller collection which measures 5 x 5 x 3 cm. Findings most consistent with infected fluid collections or abscesses of indeterminate etiology. The possibilities include previously perforated appendicitis. Diverticulitis as the cause of the abscess is less likely as no definite diverticula is identified. Clinical correlation is recommended. Musculoskeletal: There is diastases of anterior abdominal wall musculature at the level of the umbilicus with a small fat containing umbilical hernia. There is stranding of the herniated fat likely reactive to inflammatory changes of the pelvis. No acute osseous pathology. IMPRESSION: 1. Pelvic abscesses of indeterminate etiology with surrounding inflammatory changes  within the pelvis. Clinical correlation is recommended. 2. Small bowel obstruction likely secondary to adhesions in the mid abdomen. Electronically Signed   By: Anner Crete M.D.   On: 01/14/2018 03:28   US Renal  Result Date: 01/08/2018 CLINICAL DATA:  Acute kidney injury EXAM: RENAL / URINARY TRACT ULTRASOUND COMPLETE COMPARISON:  None. FINDINGS: Study is limited by patient body habitus. Portions of the RIGHT kidney is not well seen. LEFT kidney is poorly seen. Right Kidney: Length: 10.2 cm. Echogenicity within normal limits. No mass or hydronephrosis visualized. Left Kidney: Length: 9.4 cm. Echogenicity within normal limits. No mass or hydronephrosis visualized. Bladder: Appears normal for degree of bladder distention. IMPRESSION: No abnormality identified. Study limited by patient body habitus. No hydronephrosis bilaterally. Electronically Signed   By: Franki Cabot M.D.   On: 01/08/2018 23:07    Labs:  CBC: Recent Labs    01/10/18 0414 01/11/18 0447 01/13/18 1640 01/14/18 0801  WBC 19.4* 14.5* 17.8* 15.2*  HGB 10.3* 9.8* 11.5* 11.7*  HCT 34.4* 33.2* 36.9* 36.9*  PLT 169 149* 306 326    COAGS: Recent Labs    06/10/17 1414 06/11/17 0422 06/11/17 1600 09/26/17 1149  INR 2.41 2.14 1.86 1.09    BMP: Recent Labs    01/10/18 0414 01/11/18 0447 01/13/18  1640 01/14/18 0801  NA 134* 132* 128* 130*  K 3.6 3.6 4.1 4.4  CL 101 101 91* 97*  CO2 24 22 24 22   GLUCOSE 171* 232* 344* 214*  BUN 12 19 37* 38*  CALCIUM 8.1* 7.8* 8.2* 8.0*  CREATININE 1.39* 1.68* 2.08* 1.92*  GFRNONAA 54* 43* 33* 36*  GFRAA >60 49* 38* 42*    LIVER FUNCTION TESTS: Recent Labs    06/07/17 0258  06/13/17 0337 08/22/17 1151 01/08/18 0934 01/13/18 1640  BILITOT 1.4*  --   --  0.3 1.7* 1.3*  AST 24  --   --  18 18 47*  ALT 18  --   --  15 16 25   ALKPHOS 69  --   --  84 91 105  PROT 6.4*  --   --  6.7 7.3 6.8  ALBUMIN 2.8*   < > 2.4* 3.4* 3.0* 2.3*   < > = values in this interval not  displayed.    TUMOR MARKERS: No results for input(s): AFPTM, CEA, CA199, CHROMGRNA in the last 8760 hours.  Assessment and Plan: Intra-abdominal abscesses After review of imaging, it is felt the collections likely communicate and that perc drain to the dominate collection is indicated. Labs reviewed. Risks and benefits discussed with the patient including bleeding, infection, damage to adjacent structures, bowel perforation/fistula connection, and sepsis.  All of the patient's questions were answered, patient is agreeable to proceed. Consent signed and in chart.    Thank you for this interesting consult.  I greatly enjoyed meeting Jerry Clyne and look forward to participating in their care.  A copy of this report was sent to the requesting provider on this date.  Electronically Signed: Ascencion Dike, PA-C 01/14/2018, 12:15 PM   I spent a total of 20 minutes in face to face in clinical consultation, greater than 50% of which was counseling/coordinating care for perc abscess drain

## 2018-01-15 DIAGNOSIS — I5020 Unspecified systolic (congestive) heart failure: Secondary | ICD-10-CM

## 2018-01-15 DIAGNOSIS — E119 Type 2 diabetes mellitus without complications: Secondary | ICD-10-CM

## 2018-01-15 DIAGNOSIS — Z978 Presence of other specified devices: Secondary | ICD-10-CM

## 2018-01-15 LAB — SODIUM, URINE, RANDOM: Sodium, Ur: 22 mmol/L

## 2018-01-15 LAB — BASIC METABOLIC PANEL
Anion gap: 13 (ref 5–15)
Anion gap: 14 (ref 5–15)
BUN: 46 mg/dL — ABNORMAL HIGH (ref 6–20)
BUN: 48 mg/dL — AB (ref 6–20)
CHLORIDE: 96 mmol/L — AB (ref 98–111)
CO2: 25 mmol/L (ref 22–32)
CO2: 27 mmol/L (ref 22–32)
CREATININE: 3.45 mg/dL — AB (ref 0.61–1.24)
CREATININE: 3.87 mg/dL — AB (ref 0.61–1.24)
Calcium: 8.1 mg/dL — ABNORMAL LOW (ref 8.9–10.3)
Calcium: 8.1 mg/dL — ABNORMAL LOW (ref 8.9–10.3)
Chloride: 94 mmol/L — ABNORMAL LOW (ref 98–111)
GFR calc Af Amer: 18 mL/min — ABNORMAL LOW (ref >60)
GFR calc non Af Amer: 18 mL/min — ABNORMAL LOW (ref >60)
GFR calc non Af Amer: 16 mL/min — ABNORMAL LOW (ref >60)
GFR, EST AFRICAN AMERICAN: 21 mL/min — AB (ref >60)
GLUCOSE: 109 mg/dL — AB (ref 70–99)
Glucose, Bld: 148 mg/dL — ABNORMAL HIGH (ref 70–99)
POTASSIUM: 4.2 mmol/L (ref 3.5–5.1)
Potassium: 4.8 mmol/L (ref 3.5–5.1)
SODIUM: 134 mmol/L — AB (ref 135–145)
SODIUM: 135 mmol/L (ref 135–145)

## 2018-01-15 LAB — URINALYSIS, ROUTINE W REFLEX MICROSCOPIC
BILIRUBIN URINE: NEGATIVE
Glucose, UA: NEGATIVE mg/dL
KETONES UR: NEGATIVE mg/dL
LEUKOCYTES UA: NEGATIVE
NITRITE: NEGATIVE
PROTEIN: 30 mg/dL — AB
Specific Gravity, Urine: 1.023 (ref 1.005–1.030)
pH: 5 (ref 5.0–8.0)

## 2018-01-15 LAB — CREATININE, URINE, RANDOM: Creatinine, Urine: 172.76 mg/dL

## 2018-01-15 LAB — GLUCOSE, CAPILLARY
GLUCOSE-CAPILLARY: 131 mg/dL — AB (ref 70–99)
GLUCOSE-CAPILLARY: 125 mg/dL — AB (ref 70–99)
GLUCOSE-CAPILLARY: 103 mg/dL — AB (ref 70–99)
GLUCOSE-CAPILLARY: 97 mg/dL (ref 70–99)
GLUCOSE-CAPILLARY: 104 mg/dL — AB (ref 70–99)

## 2018-01-15 LAB — CBC
HCT: 39 % (ref 39.0–52.0)
HEMOGLOBIN: 11.9 g/dL — AB (ref 13.0–17.0)
MCH: 22 pg — ABNORMAL LOW (ref 26.0–34.0)
MCHC: 30.5 g/dL (ref 30.0–36.0)
MCV: 72.2 fL — ABNORMAL LOW (ref 78.0–100.0)
Platelets: 362 10*3/uL (ref 150–400)
RBC: 5.4 MIL/uL (ref 4.22–5.81)
RDW: 17.9 % — ABNORMAL HIGH (ref 11.5–15.5)
WBC: 20.9 10*3/uL — ABNORMAL HIGH (ref 4.0–10.5)

## 2018-01-15 MED ORDER — PIPERACILLIN-TAZOBACTAM IN DEX 2-0.25 GM/50ML IV SOLN
2.2500 g | Freq: Three times a day (TID) | INTRAVENOUS | Status: DC
  Administered 2018-01-15 – 2018-01-16 (×3): 2.25 g via INTRAVENOUS
  Filled 2018-01-15 (×3): qty 50

## 2018-01-15 MED ORDER — INSULIN ASPART 100 UNIT/ML ~~LOC~~ SOLN
0.0000 [IU] | SUBCUTANEOUS | Status: DC
  Administered 2018-01-15 – 2018-01-17 (×3): 3 [IU] via SUBCUTANEOUS
  Administered 2018-01-18: 7 [IU] via SUBCUTANEOUS
  Administered 2018-01-18 (×3): 4 [IU] via SUBCUTANEOUS
  Administered 2018-01-18: 3 [IU] via SUBCUTANEOUS
  Administered 2018-01-18 – 2018-01-19 (×3): 4 [IU] via SUBCUTANEOUS
  Administered 2018-01-19: 7 [IU] via SUBCUTANEOUS
  Administered 2018-01-19: 3 [IU] via SUBCUTANEOUS
  Administered 2018-01-19: 7 [IU] via SUBCUTANEOUS
  Administered 2018-01-19: 4 [IU] via SUBCUTANEOUS
  Administered 2018-01-20: 7 [IU] via SUBCUTANEOUS
  Administered 2018-01-20 (×4): 4 [IU] via SUBCUTANEOUS
  Administered 2018-01-20: 7 [IU] via SUBCUTANEOUS
  Administered 2018-01-21: 4 [IU] via SUBCUTANEOUS
  Administered 2018-01-21: 7 [IU] via SUBCUTANEOUS
  Administered 2018-01-21: 3 [IU] via SUBCUTANEOUS
  Administered 2018-01-21: 2 [IU] via SUBCUTANEOUS

## 2018-01-15 MED ORDER — HEPARIN SODIUM (PORCINE) 5000 UNIT/ML IJ SOLN
5000.0000 [IU] | Freq: Three times a day (TID) | INTRAMUSCULAR | Status: DC
  Administered 2018-01-15 – 2018-01-20 (×15): 5000 [IU] via SUBCUTANEOUS
  Filled 2018-01-15 (×15): qty 1

## 2018-01-15 MED ORDER — LACTATED RINGERS IV BOLUS
500.0000 mL | Freq: Once | INTRAVENOUS | Status: AC
  Administered 2018-01-15: 500 mL via INTRAVENOUS

## 2018-01-15 MED ORDER — VANCOMYCIN HCL 10 G IV SOLR
2000.0000 mg | Freq: Once | INTRAVENOUS | Status: AC
  Administered 2018-01-15: 2000 mg via INTRAVENOUS
  Filled 2018-01-15 (×2): qty 2000

## 2018-01-15 MED ORDER — RIVAROXABAN 20 MG PO TABS: 20.0000 mg | ORAL_TABLET | Freq: Every day | ORAL | Status: DC

## 2018-01-15 MED ORDER — LACTATED RINGERS IV BOLUS: 500.0000 mL | Freq: Once | INTRAVENOUS | Status: DC

## 2018-01-15 NOTE — Progress Notes (Signed)
Pharmacy Antibiotic Note  Terry Parks is a 62 y.o. male admitted on 01/13/2018 with intra-abdominal abscess on Zosyn but culture with GPC in pairs/in clusters.  Pharmacy has been consulted for Vancomycin dosing. Unfortunately patient in acute renal failure so will have to dose based on levels at this time. WBC up 20.9. Afebrile. Normalized CrCl down to 23 mL/min with jump in SCr up to 3.45. Low UOP recorded at 0.1 mL/kg/hr but unsure if accurate.      Plan: Vancomycin 2g IV x1, then monitor renal function for further dosing. Likely will need q48h regimen at this time. Will dose based on levels for now.  *With acute renal failure, consider changing to Cefepime + Flagyl instead of Zosyn -- Zosyn adjusted for now to 2.25g IV q8h.   Height: 6\' 1"  (185.4 cm) Weight: 284 lb 9.8 oz (129.1 kg) IBW/kg (Calculated) : 79.9  Temp (24hrs), Avg:98.7 F (37.1 C), Min:97.8 F (36.6 C), Max:99.3 F (37.4 C)  Recent Labs  Lab 01/10/18 0414 01/11/18 0447 01/13/18 1640 01/14/18 0801 01/15/18 0815  WBC 19.4* 14.5* 17.8* 15.2* 20.9*  CREATININE 1.39* 1.68* 2.08* 1.92* 3.45*    Estimated Creatinine Clearance: 32.1 mL/min (A) (by C-G formula based on SCr of 3.45 mg/dL (H)).    Allergies  Allergen Reactions  . Lisinopril Cough    Antimicrobials this admission: Zosyn 8/31 >> Vancomycin 9/1 >>  Dose adjustments this admission:   Microbiology results: 8/31 Abscess >> few GPC in pairs in clusters (IP)  Thank you for allowing pharmacy to be a part of this patient's care.  Sloan Leiter, PharmD, BCPS, BCCCP Clinical Pharmacist Clinical phone 01/15/2018 until 3:30PM [phone] Please refer to [facility] for [place] numbers 01/15/2018 12:07 PM

## 2018-01-15 NOTE — Progress Notes (Signed)
Supervising Physician: Jacqulynn Cadet  Patient Status: [facility] - In-pt  Subjective: S/p perc drain lower abd abscess yesterday. Feels ok  Objective: Physical Exam: BP (!) 142/90 (BP Location: Right Arm)   Pulse 92   Temp 98.3 F (36.8 C) (Oral)   Resp 18   Ht 6\' 1"  (1.854 m)   Wt 129.1 kg   SpO2 96%   BMI 37.55 kg/m  Drain intact, site clean. Output serous with some purulent stranding.   Current Facility-Administered Medications:  .  acetaminophen (TYLENOL) tablet 650 mg, 650 mg, Oral, Q6H PRN **OR** acetaminophen (TYLENOL) suppository 650 mg, 650 mg, Rectal, Q6H PRN, Helberg, Justin, MD .  amiodarone (PACERONE) tablet 200 mg, 200 mg, Oral, Daily, Oval Linsey, MD, 200 mg at 01/15/18 1036 .  atorvastatin (LIPITOR) tablet 40 mg, 40 mg, Oral, q1800, Helberg, Justin, MD, 40 mg at 01/14/18 1702 .  carvedilol (COREG) tablet 12.5 mg, 12.5 mg, Oral, BID WC, Helberg, Justin, MD, 12.5 mg at 01/15/18 0837 .  DULoxetine (CYMBALTA) DR capsule 60 mg, 60 mg, Oral, Daily, Helberg, Justin, MD, 60 mg at 01/15/18 1036 .  gabapentin (NEURONTIN) capsule 300 mg, 300 mg, Oral, Daily, Helberg, Justin, MD, 300 mg at 01/15/18 1036 .  heparin injection 5,000 Units, 5,000 Units, Subcutaneous, Q8H, Neva Seat, MD .  insulin aspart (novoLOG) injection 0-20 Units, 0-20 Units, Subcutaneous, Q4H, Neva Seat, MD, 3 Units at 01/15/18 [phone] .  insulin glargine (LANTUS) injection 30 Units, 30 Units, Subcutaneous, QHS, Helberg, Justin, MD, 30 Units at 01/14/18 2210 .  isosorbide mononitrate (IMDUR) 24 hr tablet 30 mg, 30 mg, Oral, Daily, Helberg, Justin, MD, 30 mg at 01/15/18 1036 .  lactated ringers infusion, , Intravenous, Continuous, Neva Seat, MD, Last Rate: 125 mL/hr at 01/15/18 1034 .  MEDLINE mouth rinse, 15 mL, Mouth Rinse, BID, Oval Linsey, MD, 15 mL at 01/14/18 2217 .  nystatin cream (MYCOSTATIN) 1 application, 1 application, Topical, BID, Oval Linsey, MD, 1  application at 84/13/24 2218 .  ondansetron (ZOFRAN) tablet 4 mg, 4 mg, Oral, Q6H PRN **OR** ondansetron (ZOFRAN) injection 4 mg, 4 mg, Intravenous, Q6H PRN, Helberg, Justin, MD, 4 mg at 01/13/18 2330 .  piperacillin-tazobactam (ZOSYN) IVPB 2.25 g, 2.25 g, Intravenous, Q8H, Millen, Jessica B, RPH .  sodium chloride flush (NS) 0.9 % injection 5 mL, 5 mL, Intracatheter, Q8H, Greggory Keen, MD, 5 mL at 01/14/18 2218  Labs: CBC Recent Labs    01/14/18 0801 01/15/18 0815  WBC 15.2* 20.9*  HGB 11.7* 11.9*  HCT 36.9* 39.0  PLT 326 362   BMET Recent Labs    01/14/18 0801 01/15/18 0815  NA 130* 134*  K 4.4 4.2  CL 97* 96*  CO2 22 25  GLUCOSE 214* 148*  BUN 38* 46*  CREATININE 1.92* 3.45*  CALCIUM 8.0* 8.1*   LFT Recent Labs    01/13/18 1640  PROT 6.8  ALBUMIN 2.3*  AST 47*  ALT 25  ALKPHOS 105  BILITOT 1.3*  BILIDIR 0.6*  IBILI 0.7   PT/INR No results for input(s): LABPROT, INR in the last 72 hours.   Studies/Results: Dg Abd 1 View  Result Date: 01/13/2018 CLINICAL DATA:  SBO  Image sitting upright per MD order EXAM: ABDOMEN - 1 VIEW COMPARISON:  CT of the abdomen and pelvis on 06/07/2010 FINDINGS: Single view of the abdomen demonstrates normal bowel gas pattern. There is no evidence for free intraperitoneal air. The pelvis is not imaged. IMPRESSION: No evidence for acute  abnormality.  Electronically Signed   By: Nolon Nations M.D.   On: 01/13/2018 20:39   Ct Abdomen Pelvis W Contrast  Result Date: 01/14/2018 CLINICAL DATA:  62 year old male with abdominal pain and vomiting. History of prostate cancer. Abdominal distension. Concern for obstruction. EXAM: CT ABDOMEN AND PELVIS WITH CONTRAST TECHNIQUE: Multidetector CT imaging of the abdomen and pelvis was performed using the standard protocol following bolus administration of intravenous contrast. CONTRAST:  2mL OMNIPAQUE IOHEXOL 300 MG/ML  SOLN COMPARISON:  CT of the abdomen pelvis dated 06/07/2010 FINDINGS: Lower  chest: The visualized lung bases are clear. There is mild cardiomegaly. No intra-abdominal free air.  Small perihepatic free fluid. Hepatobiliary: No focal liver abnormality is seen. No gallstones, gallbladder wall thickening, or biliary dilatation. Pancreas: Unremarkable. No pancreatic ductal dilatation or surrounding inflammatory changes. Spleen: Normal in size without focal abnormality. Adrenals/Urinary Tract: The adrenal glands, kidneys, and the visualized ureters appear unremarkable. Thickened appearance of the bladder wall likely partly related to underdistention and partly related to inflammatory changes/abscesses within the pelvis. Stomach/Bowel: Mildly dilated fluid-filled loops of small bowel measure up to 5 cm in caliber. There is a focal area of apparent tethering of small bowel loop in the mid abdomen (series 6 image 58 and series 3, image 46) likely representing adhesions. The distal small bowel and the terminal ileum are collapsed. There is thickened appearance of the sigmoid colon likely reactive to inflammatory changes and abscess of the pelvis. The appendix is unremarkable as visualized. Vascular/Lymphatic: Mild aortoiliac atherosclerotic disease. The IVC is unremarkable. No portal venous gas. There is no adenopathy. Reproductive: Prostatectomy.  No pelvic mass. Other: There is a 12 x 6 x 7 cm loculated fluid collection with organizing walls and surrounding inflammatory changes anterior and superior to the urinary bladder. This collection appears to communicate with a more superior smaller collection which measures 5 x 5 x 3 cm. Findings most consistent with infected fluid collections or abscesses of indeterminate etiology. The possibilities include previously perforated appendicitis. Diverticulitis as the cause of the abscess is less likely as no definite diverticula is identified. Clinical correlation is recommended. Musculoskeletal: There is diastases of anterior abdominal wall musculature at  the level of the umbilicus with a small fat containing umbilical hernia. There is stranding of the herniated fat likely reactive to inflammatory changes of the pelvis. No acute osseous pathology. IMPRESSION: 1. Pelvic abscesses of indeterminate etiology with surrounding inflammatory changes within the pelvis. Clinical correlation is recommended. 2. Small bowel obstruction likely secondary to adhesions in the mid abdomen. Electronically Signed   By: Anner Crete M.D.   On: 01/14/2018 03:28   Dg Abd Portable 1v  Result Date: 01/14/2018 CLINICAL DATA:  62 year old male status post placement of gastric tube EXAM: PORTABLE ABDOMEN - 1 VIEW COMPARISON:  CT scan of the abdomen and pelvis 01/14/2018 FINDINGS: A nasogastric tube is been placed. The tip overlies the expected location of the stomach. Mild bibasilar airspace opacities, likely atelectasis. IMPRESSION: The tip of the gastric tube overlies the expected location of the stomach. Electronically Signed   By: Jacqulynn Cadet M.D.   On: 01/14/2018 13:03   Ct Image Guided Drainage By Percutaneous Catheter  Result Date: 01/14/2018 INDICATION: Pelvic abscess, status post recent prostatectomy EXAM: CT GUIDED DRAINAGE OF ANTERIOR PELVIC ABSCESS ABSCESS MEDICATIONS: The patient is currently admitted to the hospital and receiving intravenous antibiotics. The antibiotics were administered within an appropriate time frame prior to the initiation of the procedure. ANESTHESIA/SEDATION: 1.5 mg IV Versed 100 mcg IV  Fentanyl Moderate Sedation Time:  18 minutes The patient was continuously monitored during the procedure by the interventional radiology nurse under my direct supervision. COMPLICATIONS: None immediate. TECHNIQUE: Informed written consent was obtained from the patient after a thorough discussion of the procedural risks, benefits and alternatives. All questions were addressed. Maximal Sterile Barrier Technique was utilized including caps, mask, sterile gowns,  sterile gloves, sterile drape, hand hygiene and skin antiseptic. A timeout was performed prior to the initiation of the procedure. PROCEDURE: Previous imaging reviewed. Patient positioned supine. Noncontrast localization CT performed. The anterior pelvic abscess was localized and marked for a right anterior oblique approach. Under sterile conditions and local anesthesia, CT guidance was utilized to advance an 18 gauge 15 cm needle from a right lower quadrant anterior oblique approach into the abscess. Needle position confirmed with CT. Syringe aspiration yielded purulent fluid sent for culture. Amplatz guidewire inserted followed by tract dilatation to insert a 12 [place] drain. Drain catheter position confirmed with CT. Syringe aspiration yielded 185 cc purulent fluid. Catheter secured with a Prolene suture and connected to external suction bulb. Sterile dressing applied. No immediate complication. Patient tolerated the procedure well. FINDINGS: CT imaging confirms percutaneous needle access of the anterior pelvic abscess for drain insertion IMPRESSION: Successful CT-guided anterior pelvic abscess drain insertion Electronically Signed   By: Jerilynn Mages.  Shick M.D.   On: 01/14/2018 14:14    Assessment/Plan: S/p lower abdominal abscess drainage Drain care/flushes IR following    LOS: 2 days   I spent a total of 15 minutes in face to face in clinical consultation, greater than 50% of which was counseling/coordinating care for abscess drain  Ascencion Dike PA-C 01/15/2018 11:52 AM

## 2018-01-15 NOTE — Progress Notes (Signed)
PHARMACY NOTE:  ANTIMICROBIAL RENAL DOSAGE ADJUSTMENT  Current antimicrobial regimen includes a mismatch between antimicrobial dosage and estimated renal function.  As per policy approved by the Pharmacy & Therapeutics and Medical Executive Committees, the antimicrobial dosage will be adjusted accordingly.  Current antimicrobial dosage:  Zosyn 3.375g IV q8h- 4hr infection  Indication:   Renal Function:  Sharp rise in SCr from 1.92 to 3.45 Normalized Creatinine Clearance (obesity) = 23 mL/min []      On intermittent HD, scheduled: []      On CRRT    Antimicrobial dosage has been changed to:  Zosyn 2.25g IV q8h for now  Additional comments: Discussed other medications with Internal Medicine team.    Thank you for allowing pharmacy to be a part of this patient's care.  Sloan Leiter, PharmD, BCPS, BCCCP Clinical Pharmacist Clinical phone 01/15/2018 until 3:30PM [phone] Please refer to [facility] for [place] numbers 01/15/2018 10:49 AM

## 2018-01-15 NOTE — Progress Notes (Signed)
Subjective No acute events. Feeling better today than yesterday. IR drain placed yesterday - noted 185cc of purulent fluid. NG in place - denies n/v since. Denies flatus/BM  Objective: Vital signs in last 24 hours: Temp:  [97.8 F (36.6 C)-99.3 F (37.4 C)] 98.3 F (36.8 C) (09/01 0645) Pulse Rate:  [92-104] 92 (09/01 0645) Resp:  [18] 18 (09/01 0645) BP: (119-150)/(72-91) 142/90 (09/01 0645) SpO2:  [90 %-98 %] 96 % (09/01 0645) Last BM Date: 01/11/18  Intake/Output from previous day: 08/31 0701 - 09/01 0700 In: 1779.5 [P.O.:25; I.V.:1644; IV Piggyback:100.4] Out: 4540 [Urine:400; Emesis/NG output:3850; Drains:290] Intake/Output this shift: Total I/O In: 360 [P.O.:360] Out: -   Gen: NAD, comfortable CV: RRR Pulm: Normal work of breathing Abd: obese, soft, mildly ttp in hypogastrium; no r/g; somewhat distended. Ext: SCDs in place  Lab Results: CBC  Recent Labs    01/13/18 1640 01/14/18 0801  WBC 17.8* 15.2*  HGB 11.5* 11.7*  HCT 36.9* 36.9*  PLT 306 326   BMET Recent Labs    01/13/18 1640 01/14/18 0801  NA 128* 130*  K 4.1 4.4  CL 91* 97*  CO2 24 22  GLUCOSE 344* 214*  BUN 37* 38*  CREATININE 2.08* 1.92*  CALCIUM 8.2* 8.0*   PT/INR No results for input(s): LABPROT, INR in the last 72 hours. ABG No results for input(s): PHART, HCO3 in the last 72 hours.  Invalid input(s): PCO2, PO2  Studies/Results:  Anti-infectives: Anti-infectives (From admission, onward)   Start     Dose/Rate Route Frequency Ordered Stop   01/14/18 1400  piperacillin-tazobactam (ZOSYN) IVPB 3.375 g     3.375 g 12.5 mL/hr over 240 Minutes Intravenous Every 8 hours 01/14/18 1052         Assessment/Plan: Patient Active Problem List   Diagnosis Date Noted  . Pelvic abscess in male [facility]) 01/14/2018  . Bilious vomiting with nausea 01/13/2018  . SBO (small bowel obstruction) ([place]) 01/13/2018  . Numbness 01/08/2018  . Fever 01/08/2018  . Stress incontinence of urine  01/06/2018  . Candidiasis of perineum 01/05/2018  . Pain due to dental caries 11/09/2017  . Prostate cancer ([place]) 10/06/2017  . BPH (benign prostatic hyperplasia) 07/13/2017  . Bladder mass 07/13/2017  . Persistent atrial fibrillation ([place])   . Chest pain 11/02/2016  . Vitamin D deficiency 06/22/2016  . Neuropathy in diabetes ([place]) 10/07/2015  . Erectile dysfunction 04/17/2014  . Allergic rhinitis 10/19/2012  . Diabetic retinopathy ([place]) 10/04/2012  . BPH   . Nephrolithiasis 05/29/2010  . Type 2 diabetes mellitus with peripheral neuropathy ([place]) 04/08/2008  . Obstructive sleep apnea   . Morbid obesity ([place])   . Hyperlipidemia associated with type 2 diabetes mellitus ([place])   . Essential hypertension   . Chronic combined systolic and diastolic CHF (congestive heart failure) ([place]) 03/01/2006   Terry Parks is a pleasant 5yoM with hx prostate ca (s/p recent robotic prostatectomy), HTN, Dm, CHF, a fib with large intra-abdominal abscess and reactive ileus - unclear etiology - s/p IR drainage 01/14/18  -Continue IV abx today; cxs pending -Continue NPO/MIVF/NGT given output which is bilious and 3.8L/24hrs -Ultimately will need colonoscopy but would wait 8wks beyond recovery prior to doing this   LOS: 2 days   Sharon Mt. Dema Severin, M.D. [facility]

## 2018-01-15 NOTE — Progress Notes (Addendum)
Subjective: Terry Parks reported feeling better this morning,b ut still not feeling well. He continues to experience intermittent episodes of hiccups. He denies and bowel movements or flatus. His pain is improved and he had 2.8L out of NGT yesterday. He denies fever, chills, dyspnea, or orthopnea. He asked if he could have ice chips and was told this was okay. Drain was in place at lower abdomin. The need for drainage and antibiotics was discussed with the patient as well as his SBO. All questions answered, no further complaints.   Objective:  Vital signs in last 24 hours: Vitals:   01/14/18 1509 01/14/18 1633 01/14/18 1713 01/14/18 2126  BP: (!) 150/83 130/72 125/73 125/78  Pulse: (!) 104 100 100 92  Resp: 18 18 18 18   Temp: 98.9 F (37.2 C) 99.1 F (37.3 C) 99.3 F (37.4 C) 98.8 F (37.1 C)  TempSrc: Axillary Axillary Axillary Oral  SpO2: 98% 90% 95% 97%  Weight:      Height:       Physical Exam  Constitutional:  Mildly distress  HENT:  NG Tube in place, draining bilious fluid  Cardiovascular: Normal rate, regular rhythm and normal heart sounds.  No murmur heard. Pulmonary/Chest: Effort normal and breath sounds normal. No respiratory distress.  Abdominal: He exhibits distension. There is no tenderness. There is no guarding.  No bowel sounds appreciated Drain in place at RLQ, Clean dressing over this, Draining Purulent fluid  Musculoskeletal: He exhibits edema.  Skin: Skin is warm and dry.   Assessment/Plan:  Active Problems:   SBO (small bowel obstruction) (HCC)   Pelvic abscess in male [facility])  Terry Parks is a 62 y.o male with T2DM, HTN, HFrEF, OSA and Prostate cancer s/p TURP who presented to the clinic today with progressive N/V and abdominal distention of 5-6 days duration.  Small bowel obstruction Pelvic abcesses CT findings illustrated SBO 2/2 inflation vs adhesions in the mid abdomen. It also showed a loculated 12 x 6 x 7 cm fluid collection anterior and  superior to the urinary bladder with a communicating smaller collection superiorly; consistent with abscesses of indeterminate etiology. Remains distended, without bowel sounds on exam. Still no flatus nor BM.  > IR placed percutaneous drain in abscess, purulent fluid in JP drain.  > WBC trend 17.8 >> 15.2 >> 20.9. Remains afebrile.  - Appreciate general surgery and IR recommendations  - Continue NGT - fluid repletion LR 125 ml/hr while NPO - Culture: GPC in Pairs in clusters - Continue Zosyn - Start Vancomycin - monitor vitals - Zofran - NPO  AKI Cr elevated to 3.45 today; baseline ~1. Likely in the setting decreased PO intake and dehydration. Careful fluid repletion due to CHF, trace edema and clear lungs.  - Increase LR to 152ml/hr and 500cc Bolus - Hold Xarelto and Lasix given ARF - PM BMP - AM BMP  DM - Lantus 30 units daily and SSI-resistant q4h (while NPO) - Hold PO Gabapentin, while NPO  Atrial fibrillation - Hold amiodarone, while NPO  - Xarelto held, will hold with AKI, consider resuming tomorrow - TSH wnl  HTN (125/78) - Hold PO amlodipine 5 mg qd, coreg 12.5 mg BID, while NPO - Hold lasix 40 mg BID, given ARF   HFrEF - Hold PO carvedilol 12.5 mg BID, Imdur 30 mg qd; while NPO - Hold lasix 40 mg BID, given ARF  - monitor volume status   Diet: NPO VTE Prophylaxis: Heparin Code: Full   Dispo: Discharge is pending on  resolution of SBO and determining source of pelvic abscesses.   Neva Seat, MD 01/15/2018, 6:45 AM Pager: [phone]

## 2018-01-15 NOTE — Progress Notes (Signed)
Internal Medicine Clinic Attending  I saw and evaluated the patient.  I personally confirmed the key portions of the history and exam documented by Dr. Maricela Bo and I reviewed pertinent patient test results.  The assessment, diagnosis, and plan were formulated together and I agree with the documentation in the resident's note.  Here for hospital followup. Unfortunately, vomiting "black" with no appetite since discharge, having liquid BMs. Very weak, barely able to walk. On exam, appears ill with right-sided abdominal tenderness and mild guarding, right-sided firmness but not rigid. Actively vomiting black in the room with hiccups. High concern for acute abdominal process including SBO, appendicitis, complicated diverticulitis, cholecysitis, or cholangitis. Vitals stable, obtained stat labs and called inpatient team for admission with signout directly to admitting team.   Lenice Pressman, M.D., Ph.D.

## 2018-01-15 NOTE — Progress Notes (Signed)
4 side rails up in bed per patient request

## 2018-01-16 DIAGNOSIS — K651 Peritoneal abscess: Secondary | ICD-10-CM

## 2018-01-16 DIAGNOSIS — E876 Hypokalemia: Secondary | ICD-10-CM

## 2018-01-16 DIAGNOSIS — B9561 Methicillin susceptible Staphylococcus aureus infection as the cause of diseases classified elsewhere: Secondary | ICD-10-CM

## 2018-01-16 DIAGNOSIS — E878 Other disorders of electrolyte and fluid balance, not elsewhere classified: Secondary | ICD-10-CM

## 2018-01-16 LAB — BASIC METABOLIC PANEL
Anion gap: 13 (ref 5–15)
BUN: 49 mg/dL — AB (ref 6–20)
CHLORIDE: 95 mmol/L — AB (ref 98–111)
CO2: 30 mmol/L (ref 22–32)
Calcium: 7.7 mg/dL — ABNORMAL LOW (ref 8.9–10.3)
Creatinine, Ser: 4.54 mg/dL — ABNORMAL HIGH (ref 0.61–1.24)
GFR, EST AFRICAN AMERICAN: 15 mL/min — AB (ref >60)
GFR, EST NON AFRICAN AMERICAN: 13 mL/min — AB (ref >60)
GLUCOSE: 90 mg/dL (ref 70–99)
Potassium: 3.1 mmol/L — ABNORMAL LOW (ref 3.5–5.1)
Sodium: 138 mmol/L (ref 135–145)

## 2018-01-16 LAB — GLUCOSE, CAPILLARY
GLUCOSE-CAPILLARY: 99 mg/dL (ref 70–99)
GLUCOSE-CAPILLARY: 66 mg/dL — AB (ref 70–99)
Glucose-Capillary: 100 mg/dL — ABNORMAL HIGH (ref 70–99)
Glucose-Capillary: 66 mg/dL — ABNORMAL LOW (ref 70–99)
Glucose-Capillary: 77 mg/dL (ref 70–99)
Glucose-Capillary: 77 mg/dL (ref 70–99)
Glucose-Capillary: 79 mg/dL (ref 70–99)

## 2018-01-16 LAB — CBC WITH DIFFERENTIAL/PLATELET
Abs Immature Granulocytes: 0.5 10*3/uL — ABNORMAL HIGH (ref 0.0–0.1)
Basophils Absolute: 0.1 10*3/uL (ref 0.0–0.1)
Basophils Relative: 0 %
EOS PCT: 1 %
Eosinophils Absolute: 0.2 10*3/uL (ref 0.0–0.7)
HEMATOCRIT: 34.8 % — AB (ref 39.0–52.0)
HEMOGLOBIN: 10.8 g/dL — AB (ref 13.0–17.0)
Immature Granulocytes: 2 %
LYMPHS ABS: 2.2 10*3/uL (ref 0.7–4.0)
LYMPHS PCT: 10 %
MCH: 22.2 pg — AB (ref 26.0–34.0)
MCHC: 31 g/dL (ref 30.0–36.0)
MCV: 71.6 fL — AB (ref 78.0–100.0)
MONO ABS: 2 10*3/uL — AB (ref 0.1–1.0)
Monocytes Relative: 10 %
Neutro Abs: 16 10*3/uL — ABNORMAL HIGH (ref 1.7–7.7)
Neutrophils Relative %: 77 %
Platelets: 372 10*3/uL (ref 150–400)
RBC: 4.86 MIL/uL (ref 4.22–5.81)
RDW: 17.5 % — AB (ref 11.5–15.5)
WBC: 21.1 10*3/uL — AB (ref 4.0–10.5)

## 2018-01-16 MED ORDER — INSULIN GLARGINE 100 UNIT/ML ~~LOC~~ SOLN
15.0000 [IU] | Freq: Every day | SUBCUTANEOUS | Status: DC
  Administered 2018-01-17 – 2018-01-23 (×7): 15 [IU] via SUBCUTANEOUS
  Filled 2018-01-16 (×9): qty 0.15

## 2018-01-16 MED ORDER — SODIUM CHLORIDE 0.9 % IV SOLN
2.0000 g | INTRAVENOUS | Status: DC
  Administered 2018-01-17: 2 g via INTRAVENOUS
  Filled 2018-01-16: qty 2

## 2018-01-16 MED ORDER — DEXTROSE 50 % IV SOLN
25.0000 mL | Freq: Once | INTRAVENOUS | Status: AC
  Administered 2018-01-16: 25 mL via INTRAVENOUS

## 2018-01-16 MED ORDER — VANCOMYCIN VARIABLE DOSE PER UNSTABLE RENAL FUNCTION (PHARMACIST DOSING): Status: DC

## 2018-01-16 MED ORDER — INSULIN GLARGINE 100 UNIT/ML ~~LOC~~ SOLN: 25.0000 [IU] | Freq: Every day | SUBCUTANEOUS | Status: DC

## 2018-01-16 MED ORDER — POTASSIUM CHLORIDE 10 MEQ/100ML IV SOLN
10.0000 meq | INTRAVENOUS | Status: AC
  Administered 2018-01-16 (×5): 10 meq via INTRAVENOUS
  Filled 2018-01-16 (×7): qty 100

## 2018-01-16 MED ORDER — LACTATED RINGERS IV BOLUS
1000.0000 mL | Freq: Once | INTRAVENOUS | Status: AC
  Administered 2018-01-16: 1000 mL via INTRAVENOUS

## 2018-01-16 MED ORDER — METRONIDAZOLE IN NACL 5-0.79 MG/ML-% IV SOLN
500.0000 mg | Freq: Three times a day (TID) | INTRAVENOUS | Status: DC
  Administered 2018-01-16 – 2018-01-17 (×2): 500 mg via INTRAVENOUS
  Filled 2018-01-16 (×3): qty 100

## 2018-01-16 MED ORDER — INSULIN GLARGINE 100 UNIT/ML ~~LOC~~ SOLN
15.0000 [IU] | Freq: Every day | SUBCUTANEOUS | Status: DC
  Filled 2018-01-16: qty 0.15

## 2018-01-16 MED ORDER — SODIUM CHLORIDE 0.9 % IV SOLN
2.0000 g | INTRAVENOUS | Status: DC
  Administered 2018-01-16: 2 g via INTRAVENOUS
  Filled 2018-01-16: qty 2

## 2018-01-16 MED ORDER — METRONIDAZOLE IN NACL 5-0.79 MG/ML-% IV SOLN
500.0000 mg | Freq: Three times a day (TID) | INTRAVENOUS | Status: DC
  Administered 2018-01-16: 500 mg via INTRAVENOUS
  Filled 2018-01-16 (×2): qty 100

## 2018-01-16 MED ORDER — DEXTROSE 50 % IV SOLN
50.0000 mL | Freq: Once | INTRAVENOUS | Status: AC
  Administered 2018-01-16: 50 mL via INTRAVENOUS
  Filled 2018-01-16: qty 50

## 2018-01-16 NOTE — Progress Notes (Addendum)
Subjective:  Overnight: No acute events   Today, Mr. Erasmo Downer reported that he is feeling well. His only complaint today is feeling thirsty and dry mouth. He currently denies shortness of breath, orthopnea, abdominal pain, nausea, vomiting, fevers, chills and endorses passing urine. He has not passed flatus or had a bowel movement.   Objective:  Vital signs in last 24 hours: Vitals:   01/15/18 0645 01/15/18 1435 01/15/18 2022 01/16/18 0348  BP: (!) 142/90 133/85 (!) 148/90 128/78  Pulse: 92 92 96 94  Resp: 18 20 20 20   Temp: 98.3 F (36.8 C) 98.6 F (37 C) 98.4 F (36.9 C) 98 F (36.7 C)  TempSrc: Oral Oral Oral Oral  SpO2: 96% 99% 99% 99%  Weight:      Height:       Physical exams:  Constitutional: In no apparent distress, lying in bed HEENT: NG tube in place, dry lips, patent Cardiovascular: Normal S1 and S2.  No murmurs,gallops, rubs Respiratory: Clear to auscultation bilaterally, no wheezes, crackles, rhonchi Abdomen: BS present however decreased with no hyper/hypoactive sounds, JP drain at right lower quadrant draining serous fluid with clean and dry dressing in place, nondistended, mildly tender to palpation right lower quadrant. Extremity: 1+ pitting edema bilaterally at the lower extremity    Assessment/Plan:  Active Problems:   SBO (small bowel obstruction) (HCC)   Pelvic abscess in male [facility])  Mr. Threasa Beards is a 62 year old African-American gentleman with type 2 diabetes mellitus, hypertension, HFrEF EF 35-40% in 2019, prostate cancer s/p robotic assisted laparoscopic radical prostatectomy 09/2017 currently being managed for small bowel obstruction pelvic abscess.  Small bowel obstruction, pelvic abscess: He remains clinically stable and is having adequate output from NG tube and JP drain.  Total output for NG tube in last 24 hours is 3.5 L and JP drain 85 cc of serous fluid.  Physical exam today noticeable for decreased bowel sounds and TTP however right lower  quadrant.  No BM or flatus.  Culture from pelvic abscess showed Staphylococcus aureus-pending susceptibility.  CBC still noticeable for leukocytosis of 21 (20.9<<15.2<<17.8).  BMP significant for hypokalemia and hypochloremia most likely secondary to increase output from NG tube. -Appreciate general surgery and IR recommendations. -N.p.o. with ice chips , continue NG tube -Follow-up culture and susceptibility from pelvic abscess -Abx: Discontinue Zosyn, Start cefepime 2 g, Flagyl 500 mg, continue vancomycin -Follow-up a.m. CBC -Zofran as needed nausea  Acute kidney injury: Gradually increasing creatinine with last value of 4.54 <<3.87 (Baseline Cr ~1).  This is most likely prerenal given  Hx of HFrEF, decreased PO intake from SBO, FeNa of 0.4% and notes negative output from NG tube and JP drain. Will give 1L bolus of LR and , maintenance at 16mL/hr as PE today with no signs of pulmonary congestion and he presently denies SOB, orthopnea. -Hold Xarelto and Lasix  -F/u BMP  Diabetes mellitus: CBG 77 this morning and 66 in the afternoon (previously 99<<104).  Decrease Lantus from 30U to 15U daily and sliding scale insulin. -Continue to monitor CBGs and symptomatic hypoglycemia.  -Will consider, D5 when appropriate   A-Fibb: Currently in SR. Will continue to hold Xarelto and Amiodarone  Hypertension: BP currently stable at 128/78. Will continue to hold amlodipine 5mg  daily, coreg 12.5mg  BID, Lasix 40mg  BID   Heart Failure with reduced Ejection Fraction: Last echo with EF 35-40%  In 2019. Currently no signs and symptoms of HF. Denies orthopnea, SOB and PE with no crackles on lung exam.  -Hold  PO Coreg 12.5mg  BID, Imdur 30mg  daily, lasix 40mg  BID    Diet: NPO with ice chips, LR bolus, maintenance @170mL /hr, replace electrolytes as needed. VTE Prophylaxis: Heparin Code: Full  Dispo: Discharge is pending on resolution of SBO and determining source of pelvic abscesses.   Jean Rosenthal,  MD 01/16/2018, 10:21 AM Pager: [phone] IMTS PGY-1

## 2018-01-16 NOTE — Progress Notes (Signed)
Subjective No acute events. Denies any abdominal pain. Denies n/v with NG in place. Passed gas x1 this am. Denies BM since admission  Objective: Vital signs in last 24 hours: Temp:  [98 F (36.7 C)-98.6 F (37 C)] 98 F (36.7 C) (09/02 0348) Pulse Rate:  [92-96] 94 (09/02 0348) Resp:  [20] 20 (09/02 0348) BP: (128-148)/(78-90) 128/78 (09/02 0348) SpO2:  [99 %] 99 % (09/02 0348) Last BM Date: 01/11/18  Intake/Output from previous day: 09/01 0701 - 09/02 0700 In: 3306.7 [P.O.:840; I.V.:1833.7; NG/GT:20; IV Piggyback:593] Out: 3675 [Urine:40; Emesis/NG output:3550; Drains:85] Intake/Output this shift: Total I/O In: 37 [P.O.:60] Out: -   Gen: NAD, comfortable CV: RRR Pulm: Normal work of breathing Abd: obese, soft, nontender; not significantly distended today Ext: SCDs in place  Lab Results: CBC  Recent Labs    01/15/18 0815 01/16/18 0450  WBC 20.9* 21.1*  HGB 11.9* 10.8*  HCT 39.0 34.8*  PLT 362 372   BMET Recent Labs    01/15/18 1542 01/16/18 0450  NA 135 138  K 4.8 3.1*  CL 94* 95*  CO2 27 30  GLUCOSE 109* 90  BUN 48* 49*  CREATININE 3.87* 4.54*  CALCIUM 8.1* 7.7*   PT/INR No results for input(s): LABPROT, INR in the last 72 hours. ABG No results for input(s): PHART, HCO3 in the last 72 hours.  Invalid input(s): PCO2, PO2  Studies/Results:  Anti-infectives: Anti-infectives (From admission, onward)   Start     Dose/Rate Route Frequency Ordered Stop   01/16/18 1000  ceFEPIme (MAXIPIME) 2 g in sodium chloride 0.9 % 100 mL IVPB     2 g 200 mL/hr over 30 Minutes Intravenous Every 24 hours 01/16/18 0636     01/16/18 0749  vancomycin variable dose per unstable renal function (pharmacist dosing)      Does not apply See admin instructions 01/16/18 0749     01/16/18 0645  metroNIDAZOLE (FLAGYL) IVPB 500 mg     500 mg 100 mL/hr over 60 Minutes Intravenous Every 8 hours 01/16/18 0636     01/15/18 1400  piperacillin-tazobactam (ZOSYN) IVPB 2.25 g  Status:   Discontinued     2.25 g 100 mL/hr over 30 Minutes Intravenous Every 8 hours 01/15/18 1038 01/16/18 0629   01/15/18 1230  vancomycin (VANCOCIN) 2,000 mg in sodium chloride 0.9 % 500 mL IVPB     2,000 mg 250 mL/hr over 120 Minutes Intravenous  Once 01/15/18 1215 01/15/18 1650   01/14/18 1400  piperacillin-tazobactam (ZOSYN) IVPB 3.375 g  Status:  Discontinued     3.375 g 12.5 mL/hr over 240 Minutes Intravenous Every 8 hours 01/14/18 1052 01/15/18 1038       Assessment/Plan: Patient Active Problem List   Diagnosis Date Noted  . Pelvic abscess in male [facility]) 01/14/2018  . Bilious vomiting with nausea 01/13/2018  . SBO (small bowel obstruction) ([place]) 01/13/2018  . Numbness 01/08/2018  . Fever 01/08/2018  . Stress incontinence of urine 01/06/2018  . Candidiasis of perineum 01/05/2018  . Pain due to dental caries 11/09/2017  . Prostate cancer ([place]) 10/06/2017  . BPH (benign prostatic hyperplasia) 07/13/2017  . Bladder mass 07/13/2017  . Persistent atrial fibrillation ([place])   . Chest pain 11/02/2016  . Vitamin D deficiency 06/22/2016  . Neuropathy in diabetes ([place]) 10/07/2015  . Erectile dysfunction 04/17/2014  . Allergic rhinitis 10/19/2012  . Diabetic retinopathy ([place]) 10/04/2012  . BPH   . Nephrolithiasis 05/29/2010  . Type 2 diabetes mellitus with peripheral neuropathy ([place]) 04/08/2008  .  Obstructive sleep apnea   . Morbid obesity ([place])   . Hyperlipidemia associated with type 2 diabetes mellitus ([place])   . Essential hypertension   . Chronic combined systolic and diastolic CHF (congestive heart failure) ([place]) 03/01/2006   Mr. Muzquiz is a pleasant 77yoM with hx prostate ca (s/p recent robotic prostatectomy), HTN, Dm, CHF, a fib with large intra-abdominal abscess and reactive ileus - unclear etiology - s/p IR drainage 01/14/18  -Continue IV abx today; cxs pending - would avoid nephrotoxic medications -AKI - Cr uptrending; would consider nephrology consult. Likely hypovolemia  (given NG losses) contributing in addition to vanc/zosyn -Continue NPO/MIVF/NGT given output which is bilious and remains high -Ultimately will need colonoscopy but would wait 8wks beyond recovery prior to doing this   LOS: 3 days   Sharon Mt. Dema Severin, M.D. [facility]

## 2018-01-16 NOTE — Progress Notes (Signed)
Pharmacy Antibiotic Note  Terry Parks is a 62 y.o. male admitted on 01/13/2018 with intra-abdominal infection.  Pharmacy has been consulted for Cefepime/Flagyl dosing. Already on vancomycin. WBC up a little this AM. Noted renal dysfunction.   Plan: Already on vancomycin  Cefepime 2g IV q24h Flagyl 500 mg IV q8h Trend WBC, temp, renal function   Height: 6\' 1"  (185.4 cm) Weight: 284 lb 9.8 oz (129.1 kg) IBW/kg (Calculated) : 79.9  Temp (24hrs), Avg:98.3 F (36.8 C), Min:98 F (36.7 C), Max:98.6 F (37 C)  Recent Labs  Lab 01/11/18 0447 01/13/18 1640 01/14/18 0801 01/15/18 0815 01/15/18 1542 01/16/18 0450  WBC 14.5* 17.8* 15.2* 20.9*  --  21.1*  CREATININE 1.68* 2.08* 1.92* 3.45* 3.87* 4.54*    Estimated Creatinine Clearance: 24.4 mL/min (A) (by C-G formula based on SCr of 4.54 mg/dL (H)).    Allergies  Allergen Reactions  . Lisinopril Cough     Terry Parks 01/16/2018 6:32 AM

## 2018-01-17 DIAGNOSIS — Z598 Other problems related to housing and economic circumstances: Secondary | ICD-10-CM

## 2018-01-17 DIAGNOSIS — B9562 Methicillin resistant Staphylococcus aureus infection as the cause of diseases classified elsewhere: Secondary | ICD-10-CM

## 2018-01-17 DIAGNOSIS — E11649 Type 2 diabetes mellitus with hypoglycemia without coma: Secondary | ICD-10-CM

## 2018-01-17 LAB — BASIC METABOLIC PANEL
Anion gap: 14 (ref 5–15)
BUN: 43 mg/dL — AB (ref 6–20)
CALCIUM: 7.6 mg/dL — AB (ref 8.9–10.3)
CO2: 26 mmol/L (ref 22–32)
CREATININE: 3.39 mg/dL — AB (ref 0.61–1.24)
Chloride: 100 mmol/L (ref 98–111)
GFR calc Af Amer: 21 mL/min — ABNORMAL LOW (ref >60)
GFR calc non Af Amer: 18 mL/min — ABNORMAL LOW (ref >60)
GLUCOSE: 71 mg/dL (ref 70–99)
Potassium: 3.8 mmol/L (ref 3.5–5.1)
Sodium: 140 mmol/L (ref 135–145)

## 2018-01-17 LAB — CBC
HEMATOCRIT: 32.7 % — AB (ref 39.0–52.0)
Hemoglobin: 10.2 g/dL — ABNORMAL LOW (ref 13.0–17.0)
MCH: 22.4 pg — AB (ref 26.0–34.0)
MCHC: 31.2 g/dL (ref 30.0–36.0)
MCV: 71.9 fL — AB (ref 78.0–100.0)
Platelets: 380 10*3/uL (ref 150–400)
RBC: 4.55 MIL/uL (ref 4.22–5.81)
RDW: 17.5 % — ABNORMAL HIGH (ref 11.5–15.5)
WBC: 17.5 10*3/uL — ABNORMAL HIGH (ref 4.0–10.5)

## 2018-01-17 LAB — GLUCOSE, CAPILLARY
GLUCOSE-CAPILLARY: 61 mg/dL — AB (ref 70–99)
GLUCOSE-CAPILLARY: 67 mg/dL — AB (ref 70–99)
GLUCOSE-CAPILLARY: 94 mg/dL (ref 70–99)
Glucose-Capillary: 87 mg/dL (ref 70–99)
Glucose-Capillary: 84 mg/dL (ref 70–99)
Glucose-Capillary: 109 mg/dL — ABNORMAL HIGH (ref 70–99)
Glucose-Capillary: 132 mg/dL — ABNORMAL HIGH (ref 70–99)

## 2018-01-17 MED ORDER — MENTHOL 3 MG MT LOZG: 1.0000 | LOZENGE | OROMUCOSAL | Status: DC | PRN

## 2018-01-17 MED ORDER — HYDROCODONE-ACETAMINOPHEN 5-325 MG PO TABS: 1.0000 | ORAL_TABLET | Freq: Three times a day (TID) | ORAL | Status: DC | PRN

## 2018-01-17 MED ORDER — BISACODYL 10 MG RE SUPP: 10.0000 mg | Freq: Every day | RECTAL | Status: DC | PRN

## 2018-01-17 MED ORDER — PHENOL 1.4 % MT LIQD: 1.0000 | OROMUCOSAL | Status: DC | PRN

## 2018-01-17 MED ORDER — DEXTROSE 50 % IV SOLN
25.0000 mL | Freq: Once | INTRAVENOUS | Status: AC
  Administered 2018-01-17: 25 mL via INTRAVENOUS

## 2018-01-17 MED ORDER — DEXTROSE IN LACTATED RINGERS 5 % IV SOLN
INTRAVENOUS | Status: DC
  Administered 2018-01-17 – 2018-01-18 (×2): via INTRAVENOUS

## 2018-01-17 MED ORDER — DEXTROSE IN LACTATED RINGERS 5 % IV SOLN
INTRAVENOUS | Status: DC
  Administered 2018-01-17: 12:00:00 via INTRAVENOUS

## 2018-01-17 MED ORDER — VANCOMYCIN HCL 10 G IV SOLR
1750.0000 mg | INTRAVENOUS | Status: DC
  Administered 2018-01-17: 1750 mg via INTRAVENOUS
  Filled 2018-01-17: qty 1750

## 2018-01-17 NOTE — Progress Notes (Signed)
Hypoglycemic Event  CBG: 66  Treatment: D50 IV 25 mL  Symptoms: non   Follow-up CBG: Time:2049 CBG Result:100  Possible Reasons for Event: Unknown  Comments/MD notified:protocol followed    Sherene Sires Meda Klinefelter

## 2018-01-17 NOTE — Progress Notes (Addendum)
Patient was seen today with Dr. Maricela Bo, visit deferred due to patient being admitted.

## 2018-01-17 NOTE — Progress Notes (Signed)
Subjective:  Overnight: Complained of epigastric abdominal pain that lasted for only a short period.   Today, Terry Parks was examined at bedside and was quite engaging.  Presently, his only complaint is dry mouth which he received ice chips provided by 1 of our team members.  During our encounter, he became tearful as he felt that his previous symptoms were not properly addressed.  He notes that he had ongoing emesis for " months" which he felt had been ignored and ultimately resulted in his present condition.  He had a lot of questions about the physiology of small bowel obstruction and pelvic abscess which were appropriately explained to him.  Presently, he denies abdominal pain, nausea, vomiting, fevers, chills.  He has not had a bowel movement yet but has been able to pass flatus.  He states that he would like to ambulate and he actually had an ambulation order in but was unaware of this.  In addition, he requested to be seen by social services for financial assistance, obtain insurance and community resources.    Objective:  Vital signs in last 24 hours: Vitals:   01/16/18 0348 01/16/18 1501 01/16/18 2110 01/17/18 0448  BP: 128/78 (!) 158/83 (!) 151/86 (!) 148/87  Pulse: 94 86 89 88  Resp: 20 18 19 18   Temp: 98 F (36.7 C) 98.2 F (36.8 C) 97.6 F (36.4 C) 97.6 F (36.4 C)  TempSrc: Oral Oral Oral Oral  SpO2: 99% 99% 96% 94%  Weight:      Height:       Const: In NAD, tearful at first but mood improved when team attentively listened to his complains HEENT: NGT in place, patent  CV: Normal S1, S2. No MGR Resp: CTABL, no whezes, crackles, rhonchi  Abd: JP drain at RLQ, BS decreased, non distended, non tender to palpation   Assessment/Plan:  Active Problems:   SBO (small bowel obstruction) (HCC)   Pelvic abscess in male Terry Parks)   Intra-abdominal abscess Terry Parks)  Terry Parks is a 62 year old African-American gentleman with type 2 diabetes mellitus, hypertension, HFrEF EF 35-40%  in 2019, prostate cancer s/p robotic assisted laparoscopic radical prostatectomy 09/2017 currently being managed for small bowel obstruction pelvic abscess-HD4.   Small bowel obstruction, pelvic abscess: Overnight, he complained of one episode of severe abdominal pain which spontaneously resolved.  NG tube had a total output of 4.3 L in the past 24 hours, JP with output 70 cc.  Physical exam with decreased bowel sounds with no signs of rigidity or guarding.  Leukocytosis is downtrending and currently at 17.5 from 21.1. Abdominal fluid(abscess) culture positive for MRSA with no anaerobes growing. - N.p.o. with ice chips limited to 3 to 4 cups and 24 hours. - NG tube and JP drain to remain in place - Continue IV vancomycin q48hrs: Day 2 - Abdominal fluid (abscess) did not grow anaerobes and its safe to discontinue cefepime and Flagyl - Follow-up a.m. CBC - Zofran as needed nausea - Pain regimen: Norco 5-3 25 q8h as needed for severe abdominal pain - Patient to ambulate with nursing staff today, PT/OT referral placed as well  AoCKD: His pre-renal azotemia is improving. Cr this Am 3.39 <<4.54 <<3.87. He was switched from maintenance LR 170ml/hr to D5-LR 158mL/hr in as he continues to have hypoglycemia.  - continue to monitor  - Continue to hold lasix and xarelto (on heparin for VTE ppx)  Diabetes mellitus: He continues to have low BG (67<<84<<87) and is status post 3 doses of  D50 amp.  - Start D5-LR @ 164mL/hr as he remains npo - Continue to monitor CBG - HOLD Lantus 15U for now - Continue SSI  A-Fibb: Continues to remain in SR. Will continue to hold Xarelto and Amiodarone  Hypertension: BP this AM 148/87. Will continue to hold amlodipine 5mg  daily, coreg 12.5mg  BID, Lasix 40mg  BID   Heart Failure with reduced Ejection Fraction: Continues to deny orthopnea, SOB. Physical exams today with clear lung sounds on auscultaion -Hold PO Coreg 12.5mg  BID, Imdur 30mg  daily, lasix 40mg  BID   Diet:  NPO with ice chips, D5-LR maintenance @170mL /hr, replace electrolytes as needed. VTE Prophylaxis:Heparin Code: Full  Jean Rosenthal, MD 01/17/2018, 1:14 PM Pager: [phone] IMTS PGY-1

## 2018-01-17 NOTE — Progress Notes (Signed)
[facility] Progress Note     Subjective: CC: wants to eat/drink Per RN, night shift reports large volume ice chips with increased nausea overnight. Patient sleepy this AM. Denies abdominal pain. Denies nausea this AM. Frustrated that he hasn't been able to get out of bed. Reports 1 episode flatus, no further. Reports tingling and pain in feet bilaterally.   Objective: Vital signs in last 24 hours: Temp:  [97.6 F (36.4 C)-98.2 F (36.8 C)] 97.6 F (36.4 C) (09/03 0448) Pulse Rate:  [86-89] 88 (09/03 0448) Resp:  [18-19] 18 (09/03 0448) BP: (148-158)/(83-87) 148/87 (09/03 0448) SpO2:  [94 %-99 %] 94 % (09/03 0448) Last BM Date: 01/11/18  Intake/Output from previous day: 09/02 0701 - 09/03 0700 In: 1775.7 [P.O.:90; I.V.:1216.7; NG/GT:80; IV Piggyback:369] Out: 5170 [Urine:800; Emesis/NG output:4300; Drains:70] Intake/Output this shift: No intake/output data recorded.  PE: Gen:  Sleeping, NAD Card:  Regular rate and rhythm, pedal pulses 2+ BL Pulm:  Normal effort, clear to auscultation bilaterally Abd: Soft, non-tender, moderately distended, bowel sounds hypoactive, drain present in R abdomen Skin: warm and dry, no rashes  Psych: A&Ox3   Lab Results:  Recent Labs    01/16/18 0450 01/17/18 0532  WBC 21.1* 17.5*  HGB 10.8* 10.2*  HCT 34.8* 32.7*  PLT 372 380   BMET Recent Labs    01/16/18 0450 01/17/18 0446  NA 138 140  K 3.1* 3.8  CL 95* 100  CO2 30 26  GLUCOSE 90 71  BUN 49* 43*  CREATININE 4.54* 3.39*  CALCIUM 7.7* 7.6*   PT/INR No results for input(s): LABPROT, INR in the last 72 hours. CMP     Component Value Date/Time   NA 140 01/17/2018 0446   NA 140 08/22/2017 1151   K 3.8 01/17/2018 0446   CL 100 01/17/2018 0446   CO2 26 01/17/2018 0446   GLUCOSE 71 01/17/2018 0446   BUN 43 (H) 01/17/2018 0446   BUN 6 08/22/2017 1151   CREATININE 3.39 (H) 01/17/2018 0446   CREATININE 1.08 07/18/2014 1056   CALCIUM 7.6 (L) 01/17/2018 0446   PROT 6.8 01/13/2018 1640   PROT 6.7 08/22/2017 1151   ALBUMIN 2.3 (L) 01/13/2018 1640   ALBUMIN 3.4 (L) 08/22/2017 1151   AST 47 (H) 01/13/2018 1640   ALT 25 01/13/2018 1640   ALKPHOS 105 01/13/2018 1640   BILITOT 1.3 (H) 01/13/2018 1640   BILITOT 0.3 08/22/2017 1151   GFRNONAA 18 (L) 01/17/2018 0446   GFRNONAA 76 07/18/2014 1056   GFRAA 21 (L) 01/17/2018 0446   GFRAA 88 07/18/2014 1056   Lipase     Component Value Date/Time   LIPASE 19 06/06/2017 1042       Studies/Results: No results found.  Anti-infectives: Anti-infectives (From admission, onward)   Start     Dose/Rate Route Frequency Ordered Stop   01/17/18 1000  ceFEPIme (MAXIPIME) 2 g in sodium chloride 0.9 % 100 mL IVPB     2 g 200 mL/hr over 30 Minutes Intravenous Every 24 hours 01/16/18 1359     01/16/18 2200  metroNIDAZOLE (FLAGYL) IVPB 500 mg     500 mg 100 mL/hr over 60 Minutes Intravenous Every 8 hours 01/16/18 1359     01/16/18 1000  ceFEPIme (MAXIPIME) 2 g in sodium chloride 0.9 % 100 mL IVPB  Status:  Discontinued     2 g 200 mL/hr over 30 Minutes Intravenous Every 24 hours 01/16/18 0636 01/16/18 1354   01/16/18 0749  vancomycin variable dose per  unstable renal function (pharmacist dosing)      Does not apply See admin instructions 01/16/18 0749     01/16/18 0645  metroNIDAZOLE (FLAGYL) IVPB 500 mg  Status:  Discontinued     500 mg 100 mL/hr over 60 Minutes Intravenous Every 8 hours 01/16/18 0636 01/16/18 1354   01/15/18 1400  piperacillin-tazobactam (ZOSYN) IVPB 2.25 g  Status:  Discontinued     2.25 g 100 mL/hr over 30 Minutes Intravenous Every 8 hours 01/15/18 1038 01/16/18 0629   01/15/18 1230  vancomycin (VANCOCIN) 2,000 mg in sodium chloride 0.9 % 500 mL IVPB     2,000 mg 250 mL/hr over 120 Minutes Intravenous  Once 01/15/18 1215 01/15/18 1650   01/14/18 1400  piperacillin-tazobactam (ZOSYN) IVPB 3.375 g  Status:  Discontinued     3.375 g 12.5 mL/hr over 240 Minutes Intravenous Every 8 hours  01/14/18 1052 01/15/18 1038       Assessment/Plan Hx of prostate CA - s/p recent robotic prostatectomy HTN T2DM CHF w/ rEF Atrial fibrillation  Large intra-abdominal abscess of unclear etiology - s/p IR drainage 01/14/18 - WBC 17.5 from 21 yesterday - drain cxs grew MRSA, continue IV abx - drain with 70 cc out - per IR - NGT with 4.3 L out in 24h - need to limit ice chips to 3-4 cups in a 24h period, continue NGT on LIWS until better return of bowel function - mobilize!!! AKI- Cr 3.39 from 4.54 yesterday - continue IV hydration and monitor    FEN: NPO, IVF; NGT to LIWS VTE: SCDs, SQ heparin  ID: zosyn 8/31>9/2; vanc 9/1>>, flagyl 9/2>>, cefepime 9/2>> Follow Up: TBD  LOS: 4 days    Brigid Re , [facility] 01/17/2018, 7:56 AM Pager: [phone] Consults: [phone] Mon-Fri 7:00 am-4:30 pm Sat-Sun 7:00 am-11:30 am

## 2018-01-17 NOTE — Progress Notes (Addendum)
Pharmacy Antibiotic Note  Terry Parks is a 62 y.o. male admitted on 01/13/2018 with MRSA intra-abdominal infection.  Pharmacy has been consulted for vancomycin, cefepime and Flagyl dosing.  Renal function improving, afebrile, WBC down to 17.5.   Plan: Schedule vanc 1750mg  IV Q48H Continue cefepime 2gm IV Q24H Continue Flagyl 500mg  IV Q8H Monitor renal fxn, clinical progress, vanc level as indicated Consider narrowing to vanc only given MRSA   Height: 6\' 1"  (185.4 cm) Weight: 284 lb 9.8 oz (129.1 kg) IBW/kg (Calculated) : 79.9  Temp (24hrs), Avg:97.8 F (36.6 C), Min:97.6 F (36.4 C), Max:98.2 F (36.8 C)  Recent Labs  Lab 01/13/18 1640 01/14/18 0801 01/15/18 0815 01/15/18 1542 01/16/18 0450 01/17/18 0446 01/17/18 0532  WBC 17.8* 15.2* 20.9*  --  21.1*  --  17.5*  CREATININE 2.08* 1.92* 3.45* 3.87* 4.54* 3.39*  --     Estimated Creatinine Clearance: 32.6 mL/min (A) (by C-G formula based on SCr of 3.39 mg/dL (H)).    Allergies  Allergen Reactions  . Lisinopril Cough    Cefepime 9/2>> Flagyl 9/2>> Vanc 9/1 >> Zosyn 8/31 >> 9/2  8/31 abscess - MRSA (sensitive Cipro, Septra, Vanc)   Terry Parks D. Mina Marble, PharmD, BCPS, [place] 01/17/2018, 10:36 AM

## 2018-01-17 NOTE — Discharge Summary (Signed)
Name: Jumar Greenstreet MRN: [id number] DOB: 22-Oct-1955 62 y.o. PCP: [name], MD  Date of Admission: 01/13/2018  6:35 PM Date of Discharge: 01/24/18 Attending Physician: Dr. Lynnae January  Discharge Diagnosis: 1. Small bowel obstruction  2. Pelvic Abscess  3. Acute on Chronic Kidney Disease  4. Hypertension 5. Atrial Fibrillation 6. Heart Failure with Reduced Ejection Fraction 7. Insulin dependent type 2 diabetes mellitus  Discharge Medications: Allergies as of 01/24/2018      Reactions   Lisinopril Cough      Medication List    TAKE these medications   acetaminophen 325 MG tablet Commonly known as:  TYLENOL Take 325-650 mg by mouth every 6 (six) hours as needed (for pain).   amiodarone 200 MG tablet Commonly known as:  PACERONE Take 1 tablet (200 mg total) by mouth daily.   amLODipine-atorvastatin 5-40 MG tablet Commonly known as:  CADUET Take 1 tablet by mouth daily.   atorvastatin 40 MG tablet Commonly known as:  LIPITOR Take 40 mg by mouth daily.   LANTUS SOLOSTAR 100 UNIT/ML Solostar Pen Generic drug:  Insulin Glargine Inject 60 Units into the skin daily before breakfast.   BASAGLAR KWIKPEN 100 UNIT/ML Sopn Inject 0.6 mLs (60 Units total) into the skin daily.   carvedilol 12.5 MG tablet Commonly known as:  COREG Take 1 tablet (12.5 mg total) by mouth 2 (two) times daily with a meal.   DULoxetine 60 MG capsule Commonly known as:  CYMBALTA Take 1 capsule (60 mg total) by mouth daily.   furosemide 40 MG tablet Commonly known as:  LASIX Take 1 tablet (40 mg total) by mouth 2 (two) times daily.   gabapentin 300 MG capsule Commonly known as:  NEURONTIN Take 1 capsule (300 mg total) by mouth daily.   glucose blood test strip Use as directed to test blood sugar three times a day before meals.   Insulin Pen Needle 31G X 6 MM Misc 1 pen by Does not apply route at bedtime.   isosorbide mononitrate 30 MG 24 hr tablet Commonly known as:  IMDUR Take 1  tablet (30 mg total) by mouth daily.   Lancets Misc Use to test blood sugar three times a day before meals.   losartan 50 MG tablet Commonly known as:  COZAAR Take 1 tablet (50 mg total) by mouth daily.   metFORMIN 500 MG 24 hr tablet Commonly known as:  GLUCOPHAGE-XR Take 2 tablets (1,000 mg total) by mouth 2 (two) times daily with a meal.   Misc. Devices Misc by Does not apply route. C-PAP   nystatin cream Commonly known as:  MYCOSTATIN Apply 1 application topically See admin instructions. Apply to arms and legs 2 times a day   polyethylene glycol packet Commonly known as:  MIRALAX / GLYCOLAX Take 17 g by mouth daily.   rivaroxaban 20 MG Tabs tablet Commonly known as:  XARELTO Take 20 mg by mouth daily with breakfast.   sulfamethoxazole-trimethoprim 800-160 MG tablet Commonly known as:  BACTRIM DS,SEPTRA DS Take 2 tablets by mouth 2 (two) times daily for 6 days.       Disposition and follow-up:   Mr.Morrell Littlejohn was discharged from [facility] in [place] condition.  At the hospital follow up visit please address:  1. Small bowel obstruction: Please evaluate for regularity of bowel movement.          Pelvic Abscess: Cultures positive for MRSA and initially treated with IV Vancomycin and transitioned to Bactrim. Please ensure that  he completes course of Bactrim (Last day 01/30/2018) and follow up with IR  HFrEF: His weight was up from 132kg to 137kg during his stay. Asked to increase lasix 80BOD for 3 days and then 40BID  To PCP: CT Abdomem and pelvis: Tiny sclerotic foci proximal right femur, right acetabulum, left pubic body and left sacrum may represent bone islands although small sclerotic metastatic foci not excluded given the patient's history of prostate cancer.  2.  Labs / imaging needed at time of follow-up: CBC, BMP, Magnesium  3.  Pending labs/ test needing follow-up: None  Follow-up Appointments: [facility] Follow up.   Why:  home health RN and PT Contact information: [place] [address] [phone]        Greggory Keen, MD Follow up in 1 week(s).   Specialties:  Interventional Radiology, Radiology Why:  pt will hear from IR drain clinic for follow up time and date; call [phone] if needs Contact information: [address] [place] [address] [phone]           Hospital Course by problem list: 1. Partial Small bowel obstruction       Pelvic Abscess   Mr. Ambrosino is a 62 year old African-American gentleman with medical history significant for diabetes, hypertension, chronic systolic heart failure, OSA and prostate cancer status post robotic assisted laparoscopic radical prostatectomy in 09/2017.  He presented to [facility] emergency department on 01/13/2018 with complaint of a 6-day history of nausea, vomiting and abdominal pain. In the ED vitals relatively stable with exception of elevated blood pressure of 152/91, CBC showed leukocytosis of 17.8, CT Abdomen and pelvis revealed pelvic abscesses (loculated 12 x 6 x 7 cm fluid collection anterior and superior to the urinary bladder with a communicating smaller collection superiorly) of indeterminate etiology with surrounding inflammatory changes within the pelvis and small bowel obstruction likely secondary to adhesions in the mid abdomen.  General surgery and interventional radiology were consulted.  He had an NG tube placed which has significant output.  It was discontinued on hospital day 6 after he passed a clamping trial.  In addition, interventional radiology placed a percutaneous which initially drained purulent fluid however had consistent serous/cloudy/serosanguineous fluid from the JP drain which gradually improved during his stay.  He had daily clinical improvement as he reported decreased abdominal pain, able to pass flatus and tolerating diet. He had bowel movement on  Hospital day 11. Cultures from the abscess grew MRSA and his overall antibiotic regimen are as follows: IV Cefepime, IV Flagyl, IV Zosyn, IV Vancomycin.  He was successfully transitioned to Bactrim on hospital day 8 and is to complete a total 10-day course with last day on 01/30/2018.   Repeat CT Abdomen and pelvis showed improvement of pelvic abscesses after pigtail catheter placement with stool impaction. He received miralax and colace and was able to have bowel movement on hospital day 11.  2. Acute on Chronic Kidney Disease: He had prerenal azotemia secondary to increase GI losses from his NG tube and renal function did improve and remained with IV fluids.  3. Hypertension: Mr. Vessel's BP remained relatively stable during his hospital course and was maintained on home Coreg 12.5mg  BID, Lasix 40mg  BID, Losartan, Amlodipine 5mg   4. Atrial Fibrillation: Mr Kallman was maintained on his home Xarelto and amiodarone   5. Heart Failure with Reduced Ejection Fraction: His only objective finding was bilateral lower extremity edema.  His most recent echo on 8/27 showed normal LV size with mild LV hypertrophy, EF 35-40%, diffuse hypokinesis, moderate diastolic dysfunction, mildly dilated RV with mildly decreased systolic function and No significant valvular abnormalities. His home laxsix 40mg  twice daily was resumed once his blood pressure was sustained in addition to carvedilol 12.5 mg BID and Imdur 30 mg qd.   6. Insulin dependent type 2 diabetes mellitus: He was noted to have hypoglycemia while he was NPO for management of his small bowel obstruction and received D50 amps. Also, his lantus dose was decreased and later discontinued. However, when he resumed oral intake after improvement to his small bowel obstruction, we started him back on Lantus and Novolog sliding scale. He was subsequently discharged on his home regimen.    Discharge Vitals:   BP 111/64 (BP Location: Right Arm)   Pulse 81   Temp  97.8 F (36.6 C) (Oral)   Resp 19   Ht 6\' 1"  (1.854 m)   Wt (!) 137.9 kg   SpO2 99%   BMI 40.11 kg/m   Pertinent Labs, Studies, and Procedures:  ABDOMEN - 1 VIEW COMPARISON:  CT of the abdomen and pelvis on 06/07/2010 FINDINGS: Single view of the abdomen demonstrates normal bowel gas pattern. There is no evidence for free intraperitoneal air. The pelvis is not imaged. IMPRESSION: No evidence for acute  abnormality.  01/14/18 CT Abdomen and Pelvis  IMPRESSION: 1. Pelvic abscesses of indeterminate etiology with surrounding inflammatory changes within the pelvis. Clinical correlation is recommended. 2. Small bowel obstruction likely secondary to adhesions in the mid Abdomen.  01/23/18 CT Abdomen and Pelvis  IMPRESSION: 1. Pelvic abscesses have been drained with pigtail catheter. Mild haziness of fat planes surround appendix, small bowel loops and portions of colon. This represents significant improvement from the prior examination. No new abscess identified. 2. Fluid and gas-filled normal to slightly enlarged small bowel loops without point of obstruction identified. This may represent reaction to the inflammatory process and improved from prior exam. 3. Decompressed urinary bladder with circumferential wall thickening mild haziness of surrounding fat planes may reflect irritation from drained abscess. Underlying cystitis not excluded. 4. Tiny sclerotic foci proximal right femur, right acetabulum, left pubic body and left sacrum may represent bone islands although small sclerotic metastatic foci not excluded given the patient's history of prostate cancer.   Discharge Instructions: Discharge Instructions    Call MD for:  difficulty breathing, headache or visual disturbances   Complete by:  As directed    Call MD for:  extreme fatigue   Complete by:  As directed    Call MD for:  hives   Complete by:  As directed    Call MD for:  persistant dizziness or light-headedness    Complete by:  As directed    Call MD for:  persistant nausea and vomiting   Complete by:  As directed    Call MD for:  redness, tenderness, or signs of infection (pain, swelling, redness, odor or green/yellow discharge around incision site)   Complete by:  As directed    Call MD for:  severe uncontrolled pain   Complete by:  As directed    Call MD for:  temperature >100.4   Complete by:  As directed    Diet - low sodium heart healthy   Complete by:  As directed    Discharge instructions   Complete by:  As directed    Mr. Yurkovich,   It was a pleasure taking care of  you at the hospital. You were admitted because of a small bowel obstruction and an infection in your abdomen. We treated you with antibiotics and I will discharge you with additional atibiotics. Here are my recommendations going forward:   1)Take the antibiotic (BACTRIM) twice a day until 01/30/2018 2)You will receive a call to set up an appointment to have your drain evaluated by the radiologist 3)You have an appointment with the internal medicine clinic on 01/30/18 at 1:15 PM 4)You gained 5kgs while you were admitted and it could be because of fluid from your heart failure, I want you to take LASIX 80mg  twice a day for 3 days and then resume your regular regimen of 40mg  twice a day.   ~Take Care Dr. Eileen Stanford   Increase activity slowly   Complete by:  As directed       Signed: Jean Rosenthal, MD 01/26/2018, 8:21 PM   Pager: [phone] IMTS PGY-1

## 2018-01-17 NOTE — Progress Notes (Signed)
Referring Physician(s): Dr. Nadeen Landau  Supervising Physician: Markus Daft  Patient Status:  [facility] - In-pt  Chief Complaint: Intra-abdominal abscess, reactive ileus  Subjective: Patient to attempt to ambulate with staff today.  Denies pain or concerns related to drain.   Allergies: Lisinopril  Medications: Prior to Admission medications   Medication Sig Start Date End Date Taking? Authorizing Provider  acetaminophen (TYLENOL) 325 MG tablet Take 325-650 mg by mouth every 6 (six) hours as needed (for pain).   Yes [provider]  atorvastatin (LIPITOR) 40 MG tablet Take 40 mg by mouth daily. 10/14/17  Yes [provider]  carvedilol (COREG) 12.5 MG tablet Take 1 tablet (12.5 mg total) by mouth 2 (two) times daily with a meal. 10/14/17 01/13/18 Yes [name], Vahini, MD  DULoxetine (CYMBALTA) 60 MG capsule Take 1 capsule (60 mg total) by mouth daily. 10/14/17 10/14/18 Yes [name], Vahini, MD  furosemide (LASIX) 40 MG tablet Take 40 mg by mouth 2 (two) times daily. 01/03/18  Yes [provider]  Insulin Glargine (LANTUS SOLOSTAR) 100 UNIT/ML Solostar Pen Inject 60 Units into the skin daily before breakfast.   Yes [provider]  isosorbide mononitrate (IMDUR) 30 MG 24 hr tablet Take 1 tablet (30 mg total) by mouth daily. 07/06/17  Yes Forde Dandy, PharmD  metFORMIN (GLUCOPHAGE-XR) 500 MG 24 hr tablet Take 2 tablets (1,000 mg total) by mouth 2 (two) times daily with a meal. 01/13/18  Yes Forde Dandy, PharmD  nystatin cream (MYCOSTATIN) Apply 1 application topically See admin instructions. Apply to arms and legs 2 times a day   Yes [provider]  rivaroxaban (XARELTO) 20 MG TABS tablet Take 20 mg by mouth daily with breakfast.   Yes [provider]  amiodarone (PACERONE) 200 MG tablet Take 1 tablet (200 mg total) by mouth daily. 10/14/17 01/13/18  [name], [name], MD  amLODipine-atorvastatin (CADUET) 5-40 MG tablet Take 1 tablet by  mouth daily. 10/14/17   [name], [name], MD  gabapentin (NEURONTIN) 300 MG capsule Take 1 capsule (300 mg total) by mouth daily. 10/14/17 01/13/18  [name], [name], MD  glucose blood (TRUETRACK TEST) test strip Use as directed to test blood sugar three times a day before meals. 10/04/12   Bertha Stakes, MD  Insulin Glargine (BASAGLAR KWIKPEN) 100 UNIT/ML SOPN Inject 0.6 mLs (60 Units total) into the skin daily. Patient not taking: Reported on 01/13/2018 01/11/18   [name], MD  Insulin Pen Needle (CARETOUCH PEN NEEDLES) 31G X 6 MM MISC 1 pen by Does not apply route at bedtime. 03/01/17   [name], MD  Lancets MISC Use to test blood sugar three times a day before meals. 09/14/13   Malena Catholic, MD  losartan (COZAAR) 50 MG tablet Take 1 tablet (50 mg total) by mouth daily. 10/14/17   [name], MD  Misc. Devices MISC by Does not apply route. C-PAP    [provider]     Vital Signs: BP (!) 148/87 (BP Location: Right Arm)   Pulse 88   Temp 97.6 F (36.4 C) (Oral)   Resp 18   Ht 6\' 1"  (1.854 m)   Wt 284 lb 9.8 oz (129.1 kg)   SpO2 94%   BMI 37.55 kg/m   Physical Exam  NAD, alert, cooperative Abdomen:  Mild distention, non-tender.  Drain in place in RLQ.  Serous fluid in collection bulb.  20 mL recorded overnight with an additional 5 mL in bulb at present.  Imaging: Dg Abd 1 View  Result Date: 01/13/2018 CLINICAL DATA:  SBO  Image sitting upright per MD order EXAM: ABDOMEN - 1 VIEW COMPARISON:  CT of the abdomen and pelvis on 06/07/2010 FINDINGS: Single view of the abdomen demonstrates normal bowel gas pattern. There is no evidence for free intraperitoneal air. The pelvis is not imaged. IMPRESSION: No evidence for acute  abnormality. Electronically Signed   By: Nolon Nations M.D.   On: 01/13/2018 20:39   Ct Abdomen Pelvis W Contrast  Result Date: 01/14/2018 CLINICAL DATA:  62 year old male with abdominal pain and vomiting. History of prostate cancer.  Abdominal distension. Concern for obstruction. EXAM: CT ABDOMEN AND PELVIS WITH CONTRAST TECHNIQUE: Multidetector CT imaging of the abdomen and pelvis was performed using the standard protocol following bolus administration of intravenous contrast. CONTRAST:  38mL OMNIPAQUE IOHEXOL 300 MG/ML  SOLN COMPARISON:  CT of the abdomen pelvis dated 06/07/2010 FINDINGS: Lower chest: The visualized lung bases are clear. There is mild cardiomegaly. No intra-abdominal free air.  Small perihepatic free fluid. Hepatobiliary: No focal liver abnormality is seen. No gallstones, gallbladder wall thickening, or biliary dilatation. Pancreas: Unremarkable. No pancreatic ductal dilatation or surrounding inflammatory changes. Spleen: Normal in size without focal abnormality. Adrenals/Urinary Tract: The adrenal glands, kidneys, and the visualized ureters appear unremarkable. Thickened appearance of the bladder wall likely partly related to underdistention and partly related to inflammatory changes/abscesses within the pelvis. Stomach/Bowel: Mildly dilated fluid-filled loops of small bowel measure up to 5 cm in caliber. There is a focal area of apparent tethering of small bowel loop in the mid abdomen (series 6 image 58 and series 3, image 46) likely representing adhesions. The distal small bowel and the terminal ileum are collapsed. There is thickened appearance of the sigmoid colon likely reactive to inflammatory changes and abscess of the pelvis. The appendix is unremarkable as visualized. Vascular/Lymphatic: Mild aortoiliac atherosclerotic disease. The IVC is unremarkable. No portal venous gas. There is no adenopathy. Reproductive: Prostatectomy.  No pelvic mass. Other: There is a 12 x 6 x 7 cm loculated fluid collection with organizing walls and surrounding inflammatory changes anterior and superior to the urinary bladder. This collection appears to communicate with a more superior smaller collection which measures 5 x 5 x 3 cm.  Findings most consistent with infected fluid collections or abscesses of indeterminate etiology. The possibilities include previously perforated appendicitis. Diverticulitis as the cause of the abscess is less likely as no definite diverticula is identified. Clinical correlation is recommended. Musculoskeletal: There is diastases of anterior abdominal wall musculature at the level of the umbilicus with a small fat containing umbilical hernia. There is stranding of the herniated fat likely reactive to inflammatory changes of the pelvis. No acute osseous pathology. IMPRESSION: 1. Pelvic abscesses of indeterminate etiology with surrounding inflammatory changes within the pelvis. Clinical correlation is recommended. 2. Small bowel obstruction likely secondary to adhesions in the mid abdomen. Electronically Signed   By: Anner Crete M.D.   On: 01/14/2018 03:28   Dg Abd Portable 1v  Result Date: 01/14/2018 CLINICAL DATA:  62 year old male status post placement of gastric tube EXAM: PORTABLE ABDOMEN - 1 VIEW COMPARISON:  CT scan of the abdomen and pelvis 01/14/2018 FINDINGS: A nasogastric tube is been placed. The tip overlies the expected location of the stomach. Mild bibasilar airspace opacities, likely atelectasis. IMPRESSION: The tip of the gastric tube overlies the expected location of the stomach. Electronically Signed   By: Jacqulynn Cadet M.D.   On: 01/14/2018 13:03  Ct Image Guided Drainage By Percutaneous Catheter  Result Date: 01/14/2018 INDICATION: Pelvic abscess, status post recent prostatectomy EXAM: CT GUIDED DRAINAGE OF ANTERIOR PELVIC ABSCESS ABSCESS MEDICATIONS: The patient is currently admitted to the hospital and receiving intravenous antibiotics. The antibiotics were administered within an appropriate time frame prior to the initiation of the procedure. ANESTHESIA/SEDATION: 1.5 mg IV Versed 100 mcg IV Fentanyl Moderate Sedation Time:  18 minutes The patient was continuously monitored  during the procedure by the interventional radiology nurse under my direct supervision. COMPLICATIONS: None immediate. TECHNIQUE: Informed written consent was obtained from the patient after a thorough discussion of the procedural risks, benefits and alternatives. All questions were addressed. Maximal Sterile Barrier Technique was utilized including caps, mask, sterile gowns, sterile gloves, sterile drape, hand hygiene and skin antiseptic. A timeout was performed prior to the initiation of the procedure. PROCEDURE: Previous imaging reviewed. Patient positioned supine. Noncontrast localization CT performed. The anterior pelvic abscess was localized and marked for a right anterior oblique approach. Under sterile conditions and local anesthesia, CT guidance was utilized to advance an 18 gauge 15 cm needle from a right lower quadrant anterior oblique approach into the abscess. Needle position confirmed with CT. Syringe aspiration yielded purulent fluid sent for culture. Amplatz guidewire inserted followed by tract dilatation to insert a 12 [place] drain. Drain catheter position confirmed with CT. Syringe aspiration yielded 185 cc purulent fluid. Catheter secured with a Prolene suture and connected to external suction bulb. Sterile dressing applied. No immediate complication. Patient tolerated the procedure well. FINDINGS: CT imaging confirms percutaneous needle access of the anterior pelvic abscess for drain insertion IMPRESSION: Successful CT-guided anterior pelvic abscess drain insertion Electronically Signed   By: Jerilynn Mages.  Shick M.D.   On: 01/14/2018 14:14    Labs:  CBC: Recent Labs    01/14/18 0801 01/15/18 0815 01/16/18 0450 01/17/18 0532  WBC 15.2* 20.9* 21.1* 17.5*  HGB 11.7* 11.9* 10.8* 10.2*  HCT 36.9* 39.0 34.8* 32.7*  PLT 326 362 372 380    COAGS: Recent Labs    06/10/17 1414 06/11/17 0422 06/11/17 1600 09/26/17 1149  INR 2.41 2.14 1.86 1.09    BMP: Recent Labs    01/15/18 0815  01/15/18 1542 01/16/18 0450 01/17/18 0446  NA 134* 135 138 140  K 4.2 4.8 3.1* 3.8  CL 96* 94* 95* 100  CO2 25 27 30 26   GLUCOSE 148* 109* 90 71  BUN 46* 48* 49* 43*  CALCIUM 8.1* 8.1* 7.7* 7.6*  CREATININE 3.45* 3.87* 4.54* 3.39*  GFRNONAA 18* 16* 13* 18*  GFRAA 21* 18* 15* 21*    LIVER FUNCTION TESTS: Recent Labs    06/07/17 0258  06/13/17 0337 08/22/17 1151 01/08/18 0934 01/13/18 1640  BILITOT 1.4*  --   --  0.3 1.7* 1.3*  AST 24  --   --  18 18 47*  ALT 18  --   --  15 16 25   ALKPHOS 69  --   --  84 91 105  PROT 6.4*  --   --  6.7 7.3 6.8  ALBUMIN 2.8*   < > 2.4* 3.4* 3.0* 2.3*   < > = values in this interval not displayed.    Assessment and Plan: Intra-abdominal abscess s/p drain placement 01/14/18 Patient with abscess of unknown origin. On broad spectrum abx.  WBC improving slowly. Afebrile.  Also with reactive ileus.  Continues with increased serous output.  Drain flushes easily per RN.  Continue current management. IR to follow.  Electronically Signed: Docia Barrier, PA 01/17/2018, 1:20 PM   I spent a total of 15 Minutes at the the patient's bedside AND on the patient's hospital floor or unit, greater than 50% of which was counseling/coordinating care for intra-abdominal fluid collection.

## 2018-01-17 NOTE — Progress Notes (Signed)
  Date: 01/17/2018  Patient name: Khyre Germond  Medical record number: [id number]  Date of birth: 1955/08/06   I have seen and evaluated this patient and I have discussed the plan of care with the house staff. Please see their note for complete details. I concur with their findings with the following additions/corrections: Mr Hughlett was seen on AM rounds. He was temp tearfully about his illness, inability to work, and financial stressors. We will request social work to meet with pt to assist with medicaid / disability application.  Had brief intense ABD pain last PM. No opioids ordered PRN. If pain returns, PRN opioids OK. Pain now resolved.  Pt requested ice chips today. We provided them to pt. Review of chart indicated pt overdid it on ice yesterday and had N overnight. Surgery requests < 4 cups of ice a day.    Cr improving but not at baseline. Getting IVF to make up for GI losses. Chart indicates net -7.5 L since admit. EF 35-40% in Aug 2019 but LCTAB today.   Bartholomew Crews, MD 01/17/2018, 1:37 PM

## 2018-01-17 NOTE — Social Work (Signed)
CSW acknowledging consult, for assistance with insurance please call financial counseling. CSW able to provide pt with information regarding assistance programs in community.   Alexander Mt, [place] Work [phone]

## 2018-01-17 NOTE — Progress Notes (Addendum)
Hypoglycemic Event  CBG: 67  Treatment: D50 IV 25 mL  Symptoms: None   Follow-up CBG: Time: 1229 CBG Result: 94  Possible Reasons for Event: Inadequate meal intake  Comments/MD notified: Spoke to Internal medicine intern on the phone, he spoke with attending and we got fluids switched from LR to D5LR to help prevent hypoglycemic events.     Jshon Ibe

## 2018-01-18 ENCOUNTER — Other Ambulatory Visit: Payer: Self-pay | Admitting: Internal Medicine

## 2018-01-18 DIAGNOSIS — R066 Hiccough: Secondary | ICD-10-CM

## 2018-01-18 DIAGNOSIS — E1122 Type 2 diabetes mellitus with diabetic chronic kidney disease: Secondary | ICD-10-CM

## 2018-01-18 DIAGNOSIS — I1310 Hypertensive heart and chronic kidney disease without heart failure, with stage 1 through stage 4 chronic kidney disease, or unspecified chronic kidney disease: Secondary | ICD-10-CM

## 2018-01-18 DIAGNOSIS — N189 Chronic kidney disease, unspecified: Secondary | ICD-10-CM

## 2018-01-18 LAB — CBC
HEMATOCRIT: 33.8 % — AB (ref 39.0–52.0)
Hemoglobin: 10.3 g/dL — ABNORMAL LOW (ref 13.0–17.0)
MCH: 22.1 pg — ABNORMAL LOW (ref 26.0–34.0)
MCHC: 30.5 g/dL (ref 30.0–36.0)
MCV: 72.5 fL — ABNORMAL LOW (ref 78.0–100.0)
PLATELETS: 349 10*3/uL (ref 150–400)
RBC: 4.66 MIL/uL (ref 4.22–5.81)
RDW: 17.7 % — AB (ref 11.5–15.5)
WBC: 14 10*3/uL — AB (ref 4.0–10.5)

## 2018-01-18 LAB — BASIC METABOLIC PANEL
ANION GAP: 10 (ref 5–15)
BUN: 24 mg/dL — AB (ref 6–20)
CO2: 31 mmol/L (ref 22–32)
CREATININE: 1.96 mg/dL — AB (ref 0.61–1.24)
Calcium: 8 mg/dL — ABNORMAL LOW (ref 8.9–10.3)
Chloride: 98 mmol/L (ref 98–111)
GFR, EST AFRICAN AMERICAN: 41 mL/min — AB (ref >60)
GFR, EST NON AFRICAN AMERICAN: 35 mL/min — AB (ref >60)
Glucose, Bld: 178 mg/dL — ABNORMAL HIGH (ref 70–99)
Potassium: 3 mmol/L — ABNORMAL LOW (ref 3.5–5.1)
SODIUM: 139 mmol/L (ref 135–145)

## 2018-01-18 LAB — GLUCOSE, CAPILLARY
GLUCOSE-CAPILLARY: 154 mg/dL — AB (ref 70–99)
GLUCOSE-CAPILLARY: 180 mg/dL — AB (ref 70–99)
GLUCOSE-CAPILLARY: 250 mg/dL — AB (ref 70–99)
Glucose-Capillary: 155 mg/dL — ABNORMAL HIGH (ref 70–99)
Glucose-Capillary: 140 mg/dL — ABNORMAL HIGH (ref 70–99)
Glucose-Capillary: 152 mg/dL — ABNORMAL HIGH (ref 70–99)

## 2018-01-18 MED ORDER — KCL IN DEXTROSE-NACL 20-5-0.45 MEQ/L-%-% IV SOLN
INTRAVENOUS | Status: DC
  Administered 2018-01-18 – 2018-01-21 (×6): via INTRAVENOUS
  Filled 2018-01-18 (×7): qty 1000

## 2018-01-18 MED ORDER — POTASSIUM CHLORIDE 10 MEQ/100ML IV SOLN
10.0000 meq | INTRAVENOUS | Status: AC
  Administered 2018-01-18 (×4): 10 meq via INTRAVENOUS
  Filled 2018-01-18 (×4): qty 100

## 2018-01-18 MED ORDER — VANCOMYCIN HCL 10 G IV SOLR
1500.0000 mg | INTRAVENOUS | Status: AC
  Administered 2018-01-18 – 2018-01-20 (×3): 1500 mg via INTRAVENOUS
  Filled 2018-01-18 (×3): qty 1500

## 2018-01-18 NOTE — Progress Notes (Signed)
   Subjective:  Overnight: No acute events  Today, Terry Parks was initially seen ambulating prior to assessment by the internal medicine team. He had a lot of questions regarding his small bowel obstruction and abscess which I was able to thoroughly give him an explanation for. He reported understanding. He states he is doing well and denies abdominal pain, nausea, vomiting, fevers, chills. He intermittently complained of epigastric "antarcid" pain and is passing light flatus Also, he continue to have hiccups which he states began after placement of his NGT.   Objective:  Vital signs in last 24 hours: Vitals:   01/17/18 1429 01/17/18 2018 01/18/18 0027 01/18/18 0416  BP: (!) 174/92 (!) 112/96 (!) 161/86 (!) 160/82  Pulse: 87 94 89 91  Resp: 18 18 18 18   Temp: 98.4 F (36.9 C) 97.8 F (36.6 C) 98 F (36.7 C) 98.1 F (36.7 C)  TempSrc: Axillary Oral Oral Oral  SpO2: 98% 96% 95% 98%  Weight:      Height:       Const: Much better mood today, in no distress HEENT: NG Tube in nostril  CV: RRR, no MGR Resp: CTABL, no wheezes, crackles, rhonchi  Abd: JP drain at the RLQ with serous fluid, BS+ though hypoactive, non distended, NTTP  Assessment/Plan:  Active Problems:   SBO (small bowel obstruction) (HCC)   Pelvic abscess in male [facility])   Intra-abdominal abscess [facility])  Terry Parks is a 62 year old African-American gentleman with type 2 diabetes mellitus, hypertension,HFrEF EF 35-40% in 2019,prostate cancers/probotic assisted laparoscopic radical prostatectomy05/2019currently being managed for small bowel obstruction and pelvic abscess s/p drainage by IR with JP drain. Clinically his symptoms have been improving.   Small bowel obstruction, pelvic abscess: Clinically improving and ambulating with nursing staff with no difficulty. NGT had 3.1L output, JP drain had 100cc serous output. He Is passing light flatus but no bowel movement yet. Refused initial PT evaluation but will continue  to follow.  -NPO  -Clamping trial - per General surgery  -Continue IV Vancomycin (Dosing per pharmacy): day 3 -Zofran as needed nausea -Follow up AM CBC  AoCKD: Improving. Cr today 1.96 <<3.39. Per General surgery, fluids was switched to D5 1/2 NS at a rate of 172mL/hr with 48mEq KCl.   Hypokalemia: K+ of 3 this AM s/p IV KCl 50 mEq.  -Continue to monitor   Diabetes Mellitus: Stable and will continue IVF with D5 1/2 NS at a rate of 116mL/hr and continue to hold Lantus 15U.  -Continue Novolog SSI  A-Fibb: in SR. Continue to hold Xarelto and Amiodarone   Hypertension: BP this AM 160/82. Will continue to monitor. If BP >180/100 will consider starting home regimen via NGT. His home regimen include amlodipine 5mg  daily, coreg 12.5mg  BID, Lasix 40mg  BID   HFrEF: Clinically stable, no orthopnea, DOE. Lungs clear to auscultation. Hold PO Coreg 12.5mg  BID, Imdur 30mg  daily, lasix 40mg  BID   Diet: NPOwith ice chips, D5 1/2 NS at a rate of 133mL/hr,replace electrolytes as needed. VTE Prophylaxis:Heparin Code: Full  Jean Rosenthal, MD 01/18/2018, 7:03 AM Pager: [phone] IMTS PGY-1

## 2018-01-18 NOTE — Plan of Care (Signed)
  Problem: Pain Managment: Goal: General experience of comfort will improve Outcome: Progressing   Problem: Safety: Goal: Ability to remain free from injury will improve Outcome: Progressing   Problem: Skin Integrity: Goal: Risk for impaired skin integrity will decrease Outcome: Progressing   

## 2018-01-18 NOTE — Progress Notes (Addendum)
Pharmacy Antibiotic Note  Terry Parks is a 62 y.o. male admitted on 01/13/2018 with MRSA intra-abdominal infection.  Pharmacy has been consulted for vancomycin.  Renal function continues to improve.  Afebrile, WBC trending down.   Plan: Vanc 1500mg  IV Q24H Monitor renal fxn, clinical progress, vanc trough as indicated  F/U resume AC  Height: 6\' 1"  (185.4 cm) Weight: 284 lb 9.8 oz (129.1 kg) IBW/kg (Calculated) : 79.9  Temp (24hrs), Avg:98.1 F (36.7 C), Min:97.8 F (36.6 C), Max:98.4 F (36.9 C)  Recent Labs  Lab 01/14/18 0801 01/15/18 0815 01/15/18 1542 01/16/18 0450 01/17/18 0446 01/17/18 0532 01/18/18 0607  WBC 15.2* 20.9*  --  21.1*  --  17.5* 14.0*  CREATININE 1.92* 3.45* 3.87* 4.54* 3.39*  --  1.96*    Estimated Creatinine Clearance: 56.5 mL/min (A) (by C-G formula based on SCr of 1.96 mg/dL (H)).    Allergies  Allergen Reactions  . Lisinopril Cough    Cefepime 9/2>> Flagyl 9/2>> Vanc 9/1 >> Zosyn 8/31 >> 9/2  8/31 abscess - MRSA (sensitive Cipro, Septra, Vanc)   Alishah Schulte D. Mina Marble, PharmD, BCPS, [place] 01/18/2018, 1:02 PM

## 2018-01-18 NOTE — Progress Notes (Signed)
[facility] Progress Note     Subjective: CC: ileus Patient having difficulty understanding exactly what is going on. Explained that ileus is a reactive process related to intra-abdominal infection. Patient states he has been passing some flatus. Did not get up much yesterday. Still having high output from NGT, although I suspect this has to do with how much PO intake he is having. Denies abdominal pain or nausea.   Objective: Vital signs in last 24 hours: Temp:  [97.8 F (36.6 C)-98.4 F (36.9 C)] 98.1 F (36.7 C) (09/04 0416) Pulse Rate:  [87-94] 91 (09/04 0416) Resp:  [18] 18 (09/04 0416) BP: (112-174)/(82-96) 160/82 (09/04 0416) SpO2:  [95 %-98 %] 98 % (09/04 0416) Last BM Date: 01/07/18  Intake/Output from previous day: 09/03 0701 - 09/04 0700 In: 0177 [P.O.:110; I.V.:1193] Out: 3200 [Emesis/NG output:3100; Drains:100] Intake/Output this shift: Total I/O In: -  Out: 550 [Emesis/NG output:550]  PE: Gen:  Sleeping, NAD Card:  Regular rate and rhythm, pedal pulses 2+ BL Pulm:  Normal effort, clear to auscultation bilaterally Abd: Soft, non-tender, non-distended, bowel sounds hypoactive, drain present in R abdomen with serous fluid Skin: warm and dry, no rashes  Psych: A&Ox3   Lab Results:  Recent Labs    01/17/18 0532 01/18/18 0607  WBC 17.5* 14.0*  HGB 10.2* 10.3*  HCT 32.7* 33.8*  PLT 380 349   BMET Recent Labs    01/17/18 0446 01/18/18 0607  NA 140 139  K 3.8 3.0*  CL 100 98  CO2 26 31  GLUCOSE 71 178*  BUN 43* 24*  CREATININE 3.39* 1.96*  CALCIUM 7.6* 8.0*   PT/INR No results for input(s): LABPROT, INR in the last 72 hours. CMP     Component Value Date/Time   NA 139 01/18/2018 0607   NA 140 08/22/2017 1151   K 3.0 (L) 01/18/2018 0607   CL 98 01/18/2018 0607   CO2 31 01/18/2018 0607   GLUCOSE 178 (H) 01/18/2018 0607   BUN 24 (H) 01/18/2018 0607   BUN 6 08/22/2017 1151   CREATININE 1.96 (H) 01/18/2018 0607   CREATININE 1.08  07/18/2014 1056   CALCIUM 8.0 (L) 01/18/2018 0607   PROT 6.8 01/13/2018 1640   PROT 6.7 08/22/2017 1151   ALBUMIN 2.3 (L) 01/13/2018 1640   ALBUMIN 3.4 (L) 08/22/2017 1151   AST 47 (H) 01/13/2018 1640   ALT 25 01/13/2018 1640   ALKPHOS 105 01/13/2018 1640   BILITOT 1.3 (H) 01/13/2018 1640   BILITOT 0.3 08/22/2017 1151   GFRNONAA 35 (L) 01/18/2018 0607   GFRNONAA 76 07/18/2014 1056   GFRAA 41 (L) 01/18/2018 0607   GFRAA 88 07/18/2014 1056   Lipase     Component Value Date/Time   LIPASE 19 06/06/2017 1042       Studies/Results: No results found.  Anti-infectives: Anti-infectives (From admission, onward)   Start     Dose/Rate Route Frequency Ordered Stop   01/17/18 1500  vancomycin (VANCOCIN) 1,750 mg in sodium chloride 0.9 % 500 mL IVPB     1,750 mg 250 mL/hr over 120 Minutes Intravenous Every 48 hours 01/17/18 1036     01/17/18 1000  ceFEPIme (MAXIPIME) 2 g in sodium chloride 0.9 % 100 mL IVPB  Status:  Discontinued     2 g 200 mL/hr over 30 Minutes Intravenous Every 24 hours 01/16/18 1359 01/17/18 1351   01/16/18 2200  metroNIDAZOLE (FLAGYL) IVPB 500 mg  Status:  Discontinued     500 mg 100 mL/hr  over 60 Minutes Intravenous Every 8 hours 01/16/18 1359 01/17/18 1351   01/16/18 1000  ceFEPIme (MAXIPIME) 2 g in sodium chloride 0.9 % 100 mL IVPB  Status:  Discontinued     2 g 200 mL/hr over 30 Minutes Intravenous Every 24 hours 01/16/18 0636 01/16/18 1354   01/16/18 0749  vancomycin variable dose per unstable renal function (pharmacist dosing)  Status:  Discontinued      Does not apply See admin instructions 01/16/18 0749 01/17/18 1037   01/16/18 0645  metroNIDAZOLE (FLAGYL) IVPB 500 mg  Status:  Discontinued     500 mg 100 mL/hr over 60 Minutes Intravenous Every 8 hours 01/16/18 0636 01/16/18 1354   01/15/18 1400  piperacillin-tazobactam (ZOSYN) IVPB 2.25 g  Status:  Discontinued     2.25 g 100 mL/hr over 30 Minutes Intravenous Every 8 hours 01/15/18 1038 01/16/18 0629    01/15/18 1230  vancomycin (VANCOCIN) 2,000 mg in sodium chloride 0.9 % 500 mL IVPB     2,000 mg 250 mL/hr over 120 Minutes Intravenous  Once 01/15/18 1215 01/15/18 1650   01/14/18 1400  piperacillin-tazobactam (ZOSYN) IVPB 3.375 g  Status:  Discontinued     3.375 g 12.5 mL/hr over 240 Minutes Intravenous Every 8 hours 01/14/18 1052 01/15/18 1038       Assessment/Plan Hx of prostate CA - s/p recent robotic prostatectomy HTN T2DM CHF w/ rEF Atrial fibrillation  Large intra-abdominal abscess of unclear etiology - s/p IR drainage 01/14/18 Reactive ileus secondary to above - WBC 14.0 from 17.5 yesterday - drain cxs grew MRSA, continue IV abx - drain with 100 cc out - per IR - NGT with 3.1 L out in 24h - need to limit ice chips to 3-4 cups in a 24h period, pt passing some flatus - clamping trials today  - mobilize!!! AKI- Cr 1.96, improving  Hypokalemia - changed IVF to contain K, also give 4 runs K IV and recheck in AM   FEN: NPO, IVF; NGT clamping trials VTE: SCDs, SQ heparin  ID: zosyn 8/31>9/2; flagyl 9/2>9/3, cefepime 9/2>9/3; vanc 9/1>> Follow Up: TBD  LOS: 5 days    Brigid Re , [facility] 01/18/2018, 8:56 AM Pager: [phone] Consults: [phone] Mon-Fri 7:00 am-4:30 pm Sat-Sun 7:00 am-11:30 am

## 2018-01-18 NOTE — Progress Notes (Signed)
PT Cancellation Note  Patient Details Name: Terry Parks MRN: [id number] DOB: 1955/11/17   Cancelled Treatment:    Reason Eval/Treat Not Completed: Patient declined, no reason specified. Pt declining PT evaluation at this time. Pt stated that "it's been a rough day". Pt's nursing staff reported that pt has been ambulating in hallway earlier today with mobility tech. PT will continue to follow acutely.    [place] 01/18/2018, 2:46 PM

## 2018-01-18 NOTE — Evaluation (Addendum)
Occupational Therapy Evaluation Patient Details Name: Terry Parks MRN: [id number] DOB: [birth year] Today's Date: 01/18/2018    History of Present Illness Mr. Denker is a 62 year old man with a history of diabetes, hypertension, chronic systolic heart failure, morbid obesity, obstructive sleep apnea, and prostate cancer status post robot-assisted laparoscopic radical prostatectomy in late May 2019 who presents with a six-day history of nausea, vomiting, and abdominal pain.  Recent admission 01/08/18 and was discharged with a working diagnosis of a viral illness.  [facility] and hospital re-admission on 01/14/18 complained of continued fevers, chills, abdominal pain, nausea, and generalized weakness. Workup for SBO, pelvic abscess.    Clinical Impression   PTA patient reports independent and working.  He currently completes bed mobility with supervision, UB ADL with setup assist, LB ADL with min assist, and declined transfers today but anticipate min assist.  Limited eval as patient declines to transition into standing, reporting he just got back to bed and was up walking the the mobility tech today. He is limited by decreased activity tolerance, generalized weakness, and fatigue.  Educated on safety and importance of mobility. Based on performance today, patient will benefit from continued OT services acutely in order to progress towards PLOF and at this time recommending [place] in order to maximize safety and independence upon return home.      Follow Up Recommendations  Home health OT;Supervision/Assistance - 24 hour    Equipment Recommendations  None recommended by OT    Recommendations for Other Services PT consult     Precautions / Restrictions Precautions Precautions: Fall;Other (comment) Precaution Comments: NG tube, jp drain Restrictions Weight Bearing Restrictions: No      Mobility Bed Mobility Overal bed mobility: Needs Assistance Bed Mobility:  Supine to Sit;Sit to Supine     Supine to sit: Supervision Sit to supine: Supervision   General bed mobility comments: increased time and effort to perform, no physical assit required  Transfers                 General transfer comment: declined transfers at this time, but demosntrates ability to scoot towards [facility] with supervision     Balance                                           ADL either performed or assessed with clinical judgement   ADL Overall ADL's : Needs assistance/impaired Eating/Feeding: NPO(ice chips OK)   Grooming: Set up;Sitting   Upper Body Bathing: Set up;Sitting   Lower Body Bathing: Minimal assistance;Sitting/lateral leans   Upper Body Dressing : Set up;Sitting   Lower Body Dressing: Minimal assistance;Sitting/lateral leans Lower Body Dressing Details (indicate cue type and reason): decreased reach to B feet, lateral leans    Toilet Transfer Details (indicate cue type and reason): pt declined transfers at this time, anticipate min assist          Functional mobility during ADLs: Supervision/safety(bed mobility and scooting EOB only ) General ADL Comments: patient limited by decreased activity tolerance, generalized weakness, and fatigue; evaluation limited to EOB     Vision Baseline Vision/History: Wears glasses Wears Glasses: Reading only Patient Visual Report: No change from baseline Vision Assessment?: No apparent visual deficits     Perception     Praxis      Pertinent Vitals/Pain Pain Assessment: No/denies pain     Hand Dominance Right  Extremity/Trunk Assessment Upper Extremity Assessment Upper Extremity Assessment: Generalized weakness   Lower Extremity Assessment Lower Extremity Assessment: Defer to PT evaluation   Cervical / Trunk Assessment Cervical / Trunk Assessment: Normal   Communication Communication Communication: No difficulties   Cognition Arousal/Alertness: Awake/alert Behavior  During Therapy: WFL for tasks assessed/performed Overall Cognitive Status: Within Functional Limits for tasks assessed                                     General Comments       Exercises     Shoulder Instructions      Home Living Family/patient expects to be discharged to:: Private residence Living Arrangements: Alone Available Help at Discharge: Friend(s);Available PRN/intermittently Type of Home: Apartment Home Access: Stairs to enter Entrance Stairs-Number of Steps: 26, 2 flights Entrance Stairs-Rails: Right;Left;Can reach both Home Layout: Two level;1/2 bath on main level(stays on first floor ) Alternate Level Stairs-Number of Steps: flight Alternate Level Stairs-Rails: Right;Left;Can reach both Bathroom Shower/Tub: Teacher, early years/pre: Standard     Home Equipment: Bedside commode;Walker - 2 wheels   Additional Comments: uses 3:1 commode over toilet      Prior Functioning/Environment Level of Independence: Independent        Comments: working full time, driving         OT Problem List: Decreased strength;Decreased activity tolerance;Decreased knowledge of use of DME or AE;Decreased knowledge of precautions      OT Treatment/Interventions: Self-care/ADL training;Therapeutic exercise;Energy conservation;DME and/or AE instruction;Therapeutic activities;Patient/family education;Balance training    OT Goals(Current goals can be found in the care plan section) Acute Rehab OT Goals Patient Stated Goal: to feel better OT Goal Formulation: With patient Time For Goal Achievement: 01/17/18 Potential to Achieve Goals: Good  OT Frequency: Min 2X/week   Barriers to D/C:            Co-evaluation              AM-PAC PT "6 Clicks" Daily Activity     Outcome Measure Help from another person eating meals?: None(NPO) Help from another person taking care of personal grooming?: A Little Help from another person toileting, which  includes using toliet, bedpan, or urinal?: A Little Help from another person bathing (including washing, rinsing, drying)?: A Little Help from another person to put on and taking off regular upper body clothing?: None Help from another person to put on and taking off regular lower body clothing?: A Little 6 Click Score: 20   End of Session Nurse Communication: Mobility status(pt needs)  Activity Tolerance: Patient limited by fatigue Patient left: in bed;with call bell/phone within reach  OT Visit Diagnosis: Other abnormalities of gait and mobility (R26.89);Muscle weakness (generalized) (M62.81)                Time: 2035-5974 OT Time Calculation (min): 20 min Charges:  OT General Charges $OT Visit: 1 Visit OT Evaluation $OT Eval Moderate Complexity: 1 [address] OTR/L  Pager [place] 01/18/2018, 4:04 PM

## 2018-01-18 NOTE — Care Management Note (Signed)
Case Management Note  Patient Details  Name: Yancarlos Berthold MRN: [id number] Date of Birth: [birth year]  Subjective/Objective:               SBO     Action/Plan:  Patient [place] 8/28 w charity services through [place]. He states he had one visit prior to his readmission.  He is from home alone, and confirms that he is uninsured. He has neighbors that can provide some assistance.  He states he is seen at the [facility] here and PCP listed is Vahini [name]. He states he get help from the clinic with his medications through the [facility] program.  Expected Discharge Date:                  Expected Discharge Plan:  [place]  In-House Referral:     Discharge planning Services  CM Consult  Post Acute Care Choice:    Choice offered to:     DME Arranged:    DME Agency:     HH Arranged:    [place] Agency:  Seba [facility]  Status of Service:  In process, will continue to follow  If discussed at Long Length of Stay Meetings, dates discussed:    Additional Comments:  Carles Collet, RN 01/18/2018, 11:49 AM

## 2018-01-19 DIAGNOSIS — I130 Hypertensive heart and chronic kidney disease with heart failure and stage 1 through stage 4 chronic kidney disease, or unspecified chronic kidney disease: Secondary | ICD-10-CM

## 2018-01-19 DIAGNOSIS — R400 Somnolence: Secondary | ICD-10-CM

## 2018-01-19 LAB — GLUCOSE, CAPILLARY
GLUCOSE-CAPILLARY: 178 mg/dL — AB (ref 70–99)
GLUCOSE-CAPILLARY: 190 mg/dL — AB (ref 70–99)
GLUCOSE-CAPILLARY: 237 mg/dL — AB (ref 70–99)
GLUCOSE-CAPILLARY: 238 mg/dL — AB (ref 70–99)
Glucose-Capillary: 168 mg/dL — ABNORMAL HIGH (ref 70–99)
Glucose-Capillary: 145 mg/dL — ABNORMAL HIGH (ref 70–99)

## 2018-01-19 LAB — BASIC METABOLIC PANEL
ANION GAP: 7 (ref 5–15)
BUN: 12 mg/dL (ref 6–20)
CO2: 32 mmol/L (ref 22–32)
Calcium: 7.2 mg/dL — ABNORMAL LOW (ref 8.9–10.3)
Chloride: 99 mmol/L (ref 98–111)
Creatinine, Ser: 1.53 mg/dL — ABNORMAL HIGH (ref 0.61–1.24)
GFR calc non Af Amer: 48 mL/min — ABNORMAL LOW (ref >60)
GFR, EST AFRICAN AMERICAN: 55 mL/min — AB (ref >60)
Glucose, Bld: 236 mg/dL — ABNORMAL HIGH (ref 70–99)
POTASSIUM: 3 mmol/L — AB (ref 3.5–5.1)
SODIUM: 138 mmol/L (ref 135–145)

## 2018-01-19 LAB — AEROBIC/ANAEROBIC CULTURE W GRAM STAIN (SURGICAL/DEEP WOUND): Special Requests: NORMAL

## 2018-01-19 LAB — CBC
HCT: 30.9 % — ABNORMAL LOW (ref 39.0–52.0)
Hemoglobin: 9.3 g/dL — ABNORMAL LOW (ref 13.0–17.0)
MCH: 22.1 pg — AB (ref 26.0–34.0)
MCHC: 30.1 g/dL (ref 30.0–36.0)
MCV: 73.4 fL — AB (ref 78.0–100.0)
PLATELETS: 334 10*3/uL (ref 150–400)
RBC: 4.21 MIL/uL — AB (ref 4.22–5.81)
RDW: 17.8 % — ABNORMAL HIGH (ref 11.5–15.5)
WBC: 10.4 10*3/uL (ref 4.0–10.5)

## 2018-01-19 LAB — AEROBIC/ANAEROBIC CULTURE (SURGICAL/DEEP WOUND)

## 2018-01-19 LAB — MAGNESIUM: MAGNESIUM: 1.8 mg/dL (ref 1.7–2.4)

## 2018-01-19 MED ORDER — POTASSIUM CHLORIDE CRYS ER 20 MEQ PO TBCR
40.0000 meq | EXTENDED_RELEASE_TABLET | Freq: Two times a day (BID) | ORAL | Status: AC
  Administered 2018-01-19 – 2018-01-20 (×3): 40 meq via ORAL
  Filled 2018-01-19 (×3): qty 2

## 2018-01-19 MED ORDER — POLYETHYLENE GLYCOL 3350 17 G PO PACK
17.0000 g | PACK | Freq: Every day | ORAL | Status: DC
  Administered 2018-01-19 – 2018-01-24 (×4): 17 g via ORAL
  Filled 2018-01-19 (×6): qty 1

## 2018-01-19 MED ORDER — JUVEN PO PACK
1.0000 | PACK | Freq: Two times a day (BID) | ORAL | Status: DC
  Administered 2018-01-20 – 2018-01-23 (×5): 1 via ORAL
  Filled 2018-01-19 (×8): qty 1

## 2018-01-19 MED ORDER — ADULT MULTIVITAMIN W/MINERALS CH
1.0000 | ORAL_TABLET | Freq: Every day | ORAL | Status: DC
  Administered 2018-01-20 – 2018-01-24 (×5): 1 via ORAL
  Filled 2018-01-19 (×6): qty 1

## 2018-01-19 MED ORDER — METOCLOPRAMIDE HCL 5 MG PO TABS
5.0000 mg | ORAL_TABLET | Freq: Three times a day (TID) | ORAL | Status: DC
  Administered 2018-01-19 – 2018-01-24 (×16): 5 mg via ORAL
  Filled 2018-01-19 (×16): qty 1

## 2018-01-19 MED ORDER — DOCUSATE SODIUM 100 MG PO CAPS
100.0000 mg | ORAL_CAPSULE | Freq: Two times a day (BID) | ORAL | Status: DC
  Administered 2018-01-19 – 2018-01-24 (×11): 100 mg via ORAL
  Filled 2018-01-19 (×11): qty 1

## 2018-01-19 NOTE — Progress Notes (Signed)
Inpatient Diabetes Program Recommendations  AACE/ADA: New Consensus Statement on Inpatient Glycemic Control (2019)  Target Ranges:  Prepandial:   less than 140 mg/dL      Peak postprandial:   less than 180 mg/dL (1-2 hours)      Critically ill patients:  140 - 180 mg/dL   Results for Terry Parks, Terry Parks (MRN [id number]) as of 01/19/2018 12:07  Ref. Range 01/18/2018 07:42 01/18/2018 11:19 01/18/2018 16:36 01/18/2018 20:28 01/19/2018 00:03 01/19/2018 04:49 01/19/2018 08:41  Glucose-Capillary Latest Ref Range: 70 - 99 mg/dL 140 (H)  Novolog 3 units 180 (H)  Novolog 4 units 152 (H)  Novolog 4 units 250 (H)  Novolog 7 units  Lantus 15 units 237 (H)  Novolog 7 units 238 (H)  Novolog 7 units 178 (H)  Novolog 4 units   Review of Glycemic Control  Diabetes history: DM2 Outpatient Diabetes medications: Lantus 60 units QAM, Metformin XR 1000 mg BID Current orders for Inpatient glycemic control: Lantus 15 units QHS, Novolog 0-20 units Q4H  Inpatient Diabetes Program Recommendations: Insulin - Basal: Please consider increasing Lantus to 25 units QHS (based on 0.2 units x 129 kg).  Thanks, Barnie Alderman, RN, MSN, CDE Diabetes Coordinator Inpatient Diabetes Program [phone] (Team Pager from 8am to 5pm)

## 2018-01-19 NOTE — Progress Notes (Addendum)
Initial Nutrition Assessment  DOCUMENTATION CODES:   Obesity unspecified  INTERVENTION:   -1 packet Juven BID, each packet provides 80 calories, 8 grams of carbohydrate, and 14 grams of amino acids; supplement contains CaHMB, glutamine, and arginine, to promote wound healing -MVI with minerals daily -If prolonged NPO/clear liquid diet status is anticipated, consider initiation of nutrition support  NUTRITION DIAGNOSIS:   Inadequate oral intake related to altered GI function as evidenced by NPO status(NPO/CLD x 6 days).  GOAL:   Patient will meet greater than or equal to 90% of their needs  MONITOR:   PO intake, Supplement acceptance, Diet advancement, Labs, Weight trends, Skin, I & O's  REASON FOR ASSESSMENT:   NPO/Clear Liquid Diet    ASSESSMENT:   Terry Parks is a 62 year old man with a history of diabetes, hypertension, chronic systolic heart failure, morbid obesity, obstructive sleep apnea, and prostate cancer status post robot-assisted laparoscopic radical prostatectomy in late May 2019 who presents with a six-day history of nausea, vomiting, and abdominal pain.   Pt admitted with pelvic abscess and SBO.   8/31- NGT placed, s/p CT drainage of pelvic abscess (185 ml aspirated) and rain placement 9/2- hypoglycemic event 9/3- hypoglycemic event 9/4- advanced to clear liquids 9/5- NGT removed  Reviewed I/O's: +2.2 L x 24 hours and -6.1 L since admission  Pelvic drain output: 140 ml x 24 hours  Pt sleeping in recliner at time of visit. He did not arouse to name being called.  Chart review indicates that pt has experienced a 3.9% wt loss over the past 3 months, which is not significant for time frame.  Last Hgb A1c:  9.9 (01/09/18). PTA DM medications are 60 units insulin glargine daily and 500 mg metformin daily.   Labs reviewed: CBGS: 145-178 (inpatient orders for glycemic control are 0-20 units insulin aspart every 4 hours and 15 units insulin glargine q HS).    NUTRITION - FOCUSED PHYSICAL EXAM:    Most Recent Value  Orbital Region  No depletion  Upper Arm Region  No depletion  Thoracic and Lumbar Region  No depletion  Buccal Region  No depletion  Temple Region  No depletion  Clavicle Bone Region  No depletion  Clavicle and Acromion Bone Region  No depletion  Scapular Bone Region  No depletion  Dorsal Hand  No depletion  Patellar Region  No depletion  Anterior Thigh Region  No depletion  Posterior Calf Region  No depletion  Edema (RD Assessment)  None  Hair  Reviewed  Eyes  Reviewed  Mouth  Reviewed  Skin  Reviewed  Nails  Reviewed       Diet Order:   Diet Order            Diet clear liquid Room service appropriate? Yes; Fluid consistency: Thin  Diet effective now              EDUCATION NEEDS:   Not appropriate for education at this time  Skin:  Skin Assessment: Reviewed RN Assessment  Last BM:  01/07/18  Height:   Ht Readings from Last 1 Encounters:  01/13/18 6\' 1"  (1.854 m)    Weight:   Wt Readings from Last 1 Encounters:  01/13/18 129.1 kg    Ideal Body Weight:  83.6 kg  BMI:  Body mass index is 37.55 kg/m.  Estimated Nutritional Needs:   Kcal:  2300-2500  Protein:  115-130 grams  Fluid:  2.3-2.5 L    Staphany Ditton A. Jimmye Norman, RD, LDN, CDE Pager:  [phone] After hours Pager: [phone]

## 2018-01-19 NOTE — Progress Notes (Signed)
[facility] Progress Note     Subjective: CC: ileus Improving. Tolerating NGT clamp and CLD overnight. Denies nausea or abdominal pain. Told me he was not passing flatus, but told MD he was pooping. Walked 2x yesterday, encouraged him to walk more today.   Objective: Vital signs in last 24 hours: Temp:  [97.9 F (36.6 C)-98.2 F (36.8 C)] 98 F (36.7 C) (09/05 0451) Pulse Rate:  [90-96] 96 (09/05 0451) Resp:  [18] 18 (09/05 0451) BP: (115-163)/(91-100) 115/91 (09/05 0451) SpO2:  [92 %-99 %] 96 % (09/05 0451) Last BM Date: 01/07/18  Intake/Output from previous day: 09/04 0701 - 09/05 0700 In: 3587.6 [P.O.:480; I.V.:2381.8; IV Piggyback:710.8] Out: 1340 [Urine:150; Emesis/NG output:1050; Drains:140] Intake/Output this shift: No intake/output data recorded.  PE: JQB:HALPFXTK, NAD Card: Regular rate and rhythm, pedal pulses 2+ BL Pulm: Normal effort, clear to auscultation bilaterally Abd: Soft, non-tender,non-distended, bowel soundshypoactive,drain present in R abdomen with serous fluid Skin: warm and dry, no rashes  Psych: A&Ox3   Lab Results:  Recent Labs    01/18/18 0607 01/19/18 0526  WBC 14.0* 10.4  HGB 10.3* 9.3*  HCT 33.8* 30.9*  PLT 349 334   BMET Recent Labs    01/18/18 0607 01/19/18 0526  NA 139 138  K 3.0* 3.0*  CL 98 99  CO2 31 32  GLUCOSE 178* 236*  BUN 24* 12  CREATININE 1.96* 1.53*  CALCIUM 8.0* 7.2*   PT/INR No results for input(s): LABPROT, INR in the last 72 hours. CMP     Component Value Date/Time   NA 138 01/19/2018 0526   NA 140 08/22/2017 1151   K 3.0 (L) 01/19/2018 0526   CL 99 01/19/2018 0526   CO2 32 01/19/2018 0526   GLUCOSE 236 (H) 01/19/2018 0526   BUN 12 01/19/2018 0526   BUN 6 08/22/2017 1151   CREATININE 1.53 (H) 01/19/2018 0526   CREATININE 1.08 07/18/2014 1056   CALCIUM 7.2 (L) 01/19/2018 0526   PROT 6.8 01/13/2018 1640   PROT 6.7 08/22/2017 1151   ALBUMIN 2.3 (L) 01/13/2018 1640   ALBUMIN  3.4 (L) 08/22/2017 1151   AST 47 (H) 01/13/2018 1640   ALT 25 01/13/2018 1640   ALKPHOS 105 01/13/2018 1640   BILITOT 1.3 (H) 01/13/2018 1640   BILITOT 0.3 08/22/2017 1151   GFRNONAA 48 (L) 01/19/2018 0526   GFRNONAA 76 07/18/2014 1056   GFRAA 55 (L) 01/19/2018 0526   GFRAA 88 07/18/2014 1056   Lipase     Component Value Date/Time   LIPASE 19 06/06/2017 1042       Studies/Results: No results found.  Anti-infectives: Anti-infectives (From admission, onward)   Start     Dose/Rate Route Frequency Ordered Stop   01/18/18 1400  vancomycin (VANCOCIN) 1,500 mg in sodium chloride 0.9 % 500 mL IVPB     1,500 mg 250 mL/hr over 120 Minutes Intravenous Every 24 hours 01/18/18 1304     01/17/18 1500  vancomycin (VANCOCIN) 1,750 mg in sodium chloride 0.9 % 500 mL IVPB  Status:  Discontinued     1,750 mg 250 mL/hr over 120 Minutes Intravenous Every 48 hours 01/17/18 1036 01/18/18 1304   01/17/18 1000  ceFEPIme (MAXIPIME) 2 g in sodium chloride 0.9 % 100 mL IVPB  Status:  Discontinued     2 g 200 mL/hr over 30 Minutes Intravenous Every 24 hours 01/16/18 1359 01/17/18 1351   01/16/18 2200  metroNIDAZOLE (FLAGYL) IVPB 500 mg  Status:  Discontinued  500 mg 100 mL/hr over 60 Minutes Intravenous Every 8 hours 01/16/18 1359 01/17/18 1351   01/16/18 1000  ceFEPIme (MAXIPIME) 2 g in sodium chloride 0.9 % 100 mL IVPB  Status:  Discontinued     2 g 200 mL/hr over 30 Minutes Intravenous Every 24 hours 01/16/18 0636 01/16/18 1354   01/16/18 0749  vancomycin variable dose per unstable renal function (pharmacist dosing)  Status:  Discontinued      Does not apply See admin instructions 01/16/18 0749 01/17/18 1037   01/16/18 0645  metroNIDAZOLE (FLAGYL) IVPB 500 mg  Status:  Discontinued     500 mg 100 mL/hr over 60 Minutes Intravenous Every 8 hours 01/16/18 0636 01/16/18 1354   01/15/18 1400  piperacillin-tazobactam (ZOSYN) IVPB 2.25 g  Status:  Discontinued     2.25 g 100 mL/hr over 30 Minutes  Intravenous Every 8 hours 01/15/18 1038 01/16/18 0629   01/15/18 1230  vancomycin (VANCOCIN) 2,000 mg in sodium chloride 0.9 % 500 mL IVPB     2,000 mg 250 mL/hr over 120 Minutes Intravenous  Once 01/15/18 1215 01/15/18 1650   01/14/18 1400  piperacillin-tazobactam (ZOSYN) IVPB 3.375 g  Status:  Discontinued     3.375 g 12.5 mL/hr over 240 Minutes Intravenous Every 8 hours 01/14/18 1052 01/15/18 1038       Assessment/Plan Hx of prostate CA - s/p recent robotic prostatectomy HTN - DBP elevated overnight, management per primary team  T2DM CHFw/ rEF Atrial fibrillation  Large intra-abdominal abscess of unclear etiology - s/p IR drainage 01/14/18 Reactive ileus secondary to above - WBC 10.4, normalized- drain cxs grew MRSA, continue IV abx - drain with 140 cc out- per IR - d/c NGT and keep on CLD - mobilize!!! AKI - Cr 1.53, improving can decrease IVF Hypokalemia - K 3.0, replace PO today  FEN: CLD, decrease IVF VTE: SCDs, SQ heparin ID: zosyn 8/31>9/2; flagyl 9/2>9/3, cefepime 9/2>9/3; vanc 9/1>> Follow Up: TBD  LOS: 6 days    Brigid Re , [facility] Surgery 01/19/2018, 7:43 AM Pager: [phone] Consults: [phone] Mon-Fri 7:00 am-4:30 pm Sat-Sun 7:00 am-11:30 am

## 2018-01-19 NOTE — Progress Notes (Signed)
  Date: 01/19/2018  Patient name: Terry Parks  Medical record number: [id number]  Date of birth: 18-Dec-1955   I have seen and evaluated this patient and I have discussed the plan of care with the house staff. Please see their note for complete details. I concur with their findings with the following additions/corrections: Mr. Schanz was seen on morning rounds.  He tolerated a clamping trial of his NG yesterday and surgery has started clear liquids.  He is enjoying having something by mouth.  Surgery plans to remove the NG and continue clear liquid diet.  His creatinine is now down to 1.53 which is pretty close to his baseline.  We are backing off on IV fluids.  We will need to discuss with surgery length of IV antibiotics and duration of drain.  Bartholomew Crews, MD 01/19/2018, 2:55 PM

## 2018-01-19 NOTE — Progress Notes (Signed)
   Subjective: Mr. Czaja was somnolent during exam but arousable. He reported feeling okay today. Denied abdominal pain, nausea or vomiting. He is passing gas but has not had a BM. He is tolerating the NG clamping and is wanting more liquids for his dry mouth. All questions and concerns were addressed.   Objective:  Vital signs in last 24 hours: Vitals:   01/18/18 1500 01/18/18 2046 01/19/18 0451 01/19/18 1500  BP: (!) 163/100 (!) 119/100 (!) 115/91 (!) 155/74  Pulse: 90 93 96 93  Resp:  18 18 18   Temp: 97.9 F (36.6 C) 98.2 F (36.8 C) 98 F (36.7 C) 98.5 F (36.9 C)  TempSrc: Oral Oral Oral Oral  SpO2: 99% 92% 96% 100%  Weight:      Height:       Physical Exam  Constitutional: He is well-developed, well-nourished, and in no distress.  Cardiovascular: Normal rate, regular rhythm and normal heart sounds.  No murmur heard. Pulmonary/Chest: Effort normal and breath sounds normal. No respiratory distress. He has no wheezes. He has no rales.  Abdominal: Bowel sounds are normal. He exhibits distension. There is no tenderness.  JP drain RLQ  Skin: Skin is warm and dry.  HEENT: NGT in place, clamped   Assessment/Plan:  Active Problems:   SBO (small bowel obstruction) (HCC)   Pelvic abscess in male [facility])   Intra-abdominal abscess [facility])  Mr. Corella is a 62 year old African-American gentleman with type 2 diabetes mellitus, hypertension,HFrEF EF 35-40% in 2019,prostate cancers/probotic assisted laparoscopic radical prostatectomy05/2019currently being managed for small bowel obstruction pelvic abscess-HD4.   Small bowel obstruction, pelvic abscess:  Denies any abdominal pain today. NGT clamping is well tolerated. Surgery plans to remove NGT tube today and start clear liquids. Physical exam with decreased bowel sounds.  Leukocytosis is downtrending and currently at 10.4. Afebrile. - diet clear liquids - NG tube to be removed today per surgery - Continue IV vancomycin  -  Follow-up a.m. CBC - Zofran as needed nausea - Pain regimen: Norco 5-3 25 q8h as needed for severe abdominal pain - Patient to ambulate with nursing staff today, PT/OT referral placed as well  AoCKD:  His pre-renal azotemia is improving. Cr 1.53. Decreased D5-LR to 100 ml/hr. - continue to monitor  - continue to hold lasix and xarelto (on heparin for VTE ppx)  Diabetes mellitus: CBG 145  - decreased D5-LR to 141mL/hr  - Continue to monitor CBG - Lantus 15U and SSI-resistant  A-Fibb: Continues to remain in SR. Will continue to hold Xarelto and Amiodarone  Hypertension:  BP this AM 115/91. Will continue to hold amlodipine 5mg  daily, coreg 12.5mg  BID, Lasix 40mg  BID   Heart Failure with reduced Ejection Fraction: Continues to deny orthopnea, SOB. Physical exams today with clear lung sounds on auscultaion -Hold PO Coreg 12.5mg  BID, Imdur 30mg  daily, lasix 40mg  BID   Diet: clear liquids, D5-LR maintenance @100mL /hr,replace electrolytes as needed. VTE Prophylaxis:Heparin Code: Full  Dispo: Anticipated discharge pending on clinical improvement, BM and tolerating PO intake.  Mike Craze, DO 01/19/2018, 4:46 PM Pager: [phone]

## 2018-01-19 NOTE — Progress Notes (Signed)
PT Cancellation Note  Patient Details Name: Terry Parks MRN: [id number] DOB: [birth year]   Cancelled Treatment:    Reason Eval/Treat Not Completed: Patient declined, no reason specified. Pt refusing again this afternoon, saying that he has some guests coming and will walk after they leave. Refusing to participate in any assessment or mobility at this time. PT will continue to f/u with pt acutely.    [place] 01/19/2018, 2:49 PM

## 2018-01-19 NOTE — Progress Notes (Signed)
Referring Physician(s): Dr. Nadeen Landau  Supervising Physician: Markus Daft  Patient Status:  Terry Parks - In-pt  Chief Complaint: Intra-abdominal abscess, reactive ileus  Subjective: NGT out, tolerating clears so far. Says he's passed some flatus  Denies pain or concerns related to drain.   Allergies: Lisinopril  Medications:  Current Facility-Administered Medications:  .  acetaminophen (TYLENOL) tablet 650 mg, 650 mg, Oral, Q6H PRN **OR** acetaminophen (TYLENOL) suppository 650 mg, 650 mg, Rectal, Q6H PRN, Helberg, Justin, MD .  bisacodyl (DULCOLAX) suppository 10 mg, 10 mg, Rectal, Daily PRN, Rayburn, Kelly A, PA-C .  dextrose 5 % and 0.45 % NaCl with KCl 20 mEq/L infusion, , Intravenous, Continuous, Rayburn, Kelly A, PA-C, Last Rate: 100 mL/hr at 01/19/18 0936 .  docusate sodium (COLACE) capsule 100 mg, 100 mg, Oral, BID, Rayburn, Kelly A, PA-C, 100 mg at 01/19/18 0941 .  heparin injection 5,000 Units, 5,000 Units, Subcutaneous, Q8H, Neva Seat, MD, 5,000 Units at 01/19/18 0456 .  HYDROcodone-acetaminophen (NORCO/VICODIN) 5-325 MG per tablet 1 tablet, 1 tablet, Oral, TID PRN, Agyei, Obed K, MD .  insulin aspart (novoLOG) injection 0-20 Units, 0-20 Units, Subcutaneous, Q4H, Neva Seat, MD, 4 Units at 01/19/18 0940 .  insulin glargine (LANTUS) injection 15 Units, 15 Units, Subcutaneous, QHS, Agyei, Obed K, MD, 15 Units at 01/18/18 2039 .  MEDLINE mouth rinse, 15 mL, Mouth Rinse, BID, Oval Linsey, MD, 15 mL at 01/19/18 0941 .  menthol-cetylpyridinium (CEPACOL) lozenge 3 mg, 1 lozenge, Oral, PRN, Rayburn, Kelly A, PA-C .  metoCLOPramide (REGLAN) tablet 5 mg, 5 mg, Oral, TID AC, Rayburn, Kelly A, PA-C .  nystatin cream (MYCOSTATIN) 1 application, 1 application, Topical, BID, Oval Linsey, MD, 1 application at 97/98/92 0940 .  ondansetron (ZOFRAN) tablet 4 mg, 4 mg, Oral, Q6H PRN **OR** ondansetron (ZOFRAN) injection 4 mg, 4 mg, Intravenous, Q6H PRN, Helberg,  Justin, MD, 4 mg at 01/13/18 2330 .  phenol (CHLORASEPTIC) mouth spray 1 spray, 1 spray, Mouth/Throat, PRN, Rayburn, Kelly A, PA-C .  polyethylene glycol (MIRALAX / GLYCOLAX) packet 17 g, 17 g, Oral, Daily, Rayburn, Kelly A, PA-C, 17 g at 01/19/18 0945 .  potassium chloride SA (K-DUR,KLOR-CON) CR tablet 40 mEq, 40 mEq, Oral, BID, Rayburn, Kelly A, PA-C, 40 mEq at 01/19/18 0941 .  sodium chloride flush (NS) 0.9 % injection 5 mL, 5 mL, Intracatheter, Q8H, Greggory Keen, MD, 5 mL at 01/19/18 0457 .  vancomycin (VANCOCIN) 1,500 mg in sodium chloride 0.9 % 500 mL IVPB, 1,500 mg, Intravenous, Q24H, Dang, Thuy D, RPH, Last Rate: 250 mL/hr at 01/18/18 1440, 1,500 mg at 01/18/18 1440    Vital Signs: BP (!) 115/91 (BP Location: Right Arm)   Pulse 96   Temp 98 F (36.7 C) (Oral)   Resp 18   Ht 6\' 1"  (1.854 m)   Wt 129.1 kg   SpO2 96%   BMI 37.55 kg/m   Physical Exam  NAD, alert, cooperative. Sitting up in chair Abdomen:  Mild distention, non-tender.  Drain in place in RLQ.  Serous fluid in collection bulb.  140 mL recorded past 24hrs  Imaging: No results found.  Labs:  CBC: Recent Labs    01/16/18 0450 01/17/18 0532 01/18/18 0607 01/19/18 0526  WBC 21.1* 17.5* 14.0* 10.4  HGB 10.8* 10.2* 10.3* 9.3*  HCT 34.8* 32.7* 33.8* 30.9*  PLT 372 380 349 334    COAGS: Recent Labs    06/10/17 1414 06/11/17 0422 06/11/17 1600 09/26/17 1149  INR 2.41 2.14 1.86 1.09  BMP: Recent Labs    01/16/18 0450 01/17/18 0446 01/18/18 0607 01/19/18 0526  NA 138 140 139 138  K 3.1* 3.8 3.0* 3.0*  CL 95* 100 98 99  CO2 30 26 31  32  GLUCOSE 90 71 178* 236*  BUN 49* 43* 24* 12  CALCIUM 7.7* 7.6* 8.0* 7.2*  CREATININE 4.54* 3.39* 1.96* 1.53*  GFRNONAA 13* 18* 35* 48*  GFRAA 15* 21* 41* 55*    LIVER FUNCTION TESTS: Recent Labs    06/07/17 0258  06/13/17 0337 08/22/17 1151 01/08/18 0934 01/13/18 1640  BILITOT 1.4*  --   --  0.3 1.7* 1.3*  AST 24  --   --  18 18 47*  ALT 18  --    --  15 16 25   ALKPHOS 69  --   --  84 91 105  PROT 6.4*  --   --  6.7 7.3 6.8  ALBUMIN 2.8*   < > 2.4* 3.4* 3.0* 2.3*   < > = values in this interval not displayed.    Assessment and Plan: Intra-abdominal abscess s/p drain placement 01/14/18 Patient with abscess of unknown origin. On broad spectrum abx.  WBC improving slowly. Afebrile.  Also with reactive ileus.  Continues with increased serous output.  Drain flushes easily per RN.  Continue current management. IR to follow.   Electronically Signed: Ascencion Dike, PA-C 01/19/2018, 11:20 AM   I spent a total of 15 Minutes at the the patient's bedside AND on the patient's hospital floor or unit, greater than 50% of which was counseling/coordinating care for intra-abdominal fluid collection.

## 2018-01-19 NOTE — Progress Notes (Signed)
PT Cancellation Note  Patient Details Name: Izaak Sahr MRN: [id number] DOB: 1955-12-03   Cancelled Treatment:    Reason Eval/Treat Not Completed: Fatigue/lethargy limiting ability to participate. Pt very lethargic, unable to arouse with multimodal stimuli. PT will continue to follow acutely for PT evaluation when appropriate.    Clearnce Sorrel Herschel Fleagle 01/19/2018, 10:28 AM

## 2018-01-20 LAB — BASIC METABOLIC PANEL
Anion gap: 10 (ref 5–15)
BUN: 6 mg/dL (ref 6–20)
CALCIUM: 7.6 mg/dL — AB (ref 8.9–10.3)
CO2: 27 mmol/L (ref 22–32)
CREATININE: 1.47 mg/dL — AB (ref 0.61–1.24)
Chloride: 100 mmol/L (ref 98–111)
GFR calc Af Amer: 58 mL/min — ABNORMAL LOW (ref >60)
GFR, EST NON AFRICAN AMERICAN: 50 mL/min — AB (ref >60)
GLUCOSE: 162 mg/dL — AB (ref 70–99)
POTASSIUM: 3.5 mmol/L (ref 3.5–5.1)
SODIUM: 137 mmol/L (ref 135–145)

## 2018-01-20 LAB — GLUCOSE, CAPILLARY
GLUCOSE-CAPILLARY: 190 mg/dL — AB (ref 70–99)
GLUCOSE-CAPILLARY: 176 mg/dL — AB (ref 70–99)
Glucose-Capillary: 156 mg/dL — ABNORMAL HIGH (ref 70–99)
Glucose-Capillary: 176 mg/dL — ABNORMAL HIGH (ref 70–99)
Glucose-Capillary: 204 mg/dL — ABNORMAL HIGH (ref 70–99)
Glucose-Capillary: 216 mg/dL — ABNORMAL HIGH (ref 70–99)

## 2018-01-20 LAB — MAGNESIUM: Magnesium: 1.5 mg/dL — ABNORMAL LOW (ref 1.7–2.4)

## 2018-01-20 MED ORDER — RIVAROXABAN 20 MG PO TABS: 20.0000 mg | ORAL_TABLET | Freq: Every day | ORAL | Status: DC

## 2018-01-20 MED ORDER — SULFAMETHOXAZOLE-TRIMETHOPRIM 800-160 MG PO TABS
2.0000 | ORAL_TABLET | Freq: Two times a day (BID) | ORAL | Status: DC
  Administered 2018-01-21 – 2018-01-24 (×6): 2 via ORAL
  Filled 2018-01-20 (×7): qty 2

## 2018-01-20 MED ORDER — LOSARTAN POTASSIUM 50 MG PO TABS
50.0000 mg | ORAL_TABLET | Freq: Every day | ORAL | Status: DC
  Administered 2018-01-20 – 2018-01-24 (×5): 50 mg via ORAL
  Filled 2018-01-20 (×5): qty 1

## 2018-01-20 MED ORDER — FUROSEMIDE 40 MG PO TABS
40.0000 mg | ORAL_TABLET | Freq: Two times a day (BID) | ORAL | Status: DC
  Administered 2018-01-20 – 2018-01-24 (×8): 40 mg via ORAL
  Filled 2018-01-20 (×8): qty 1

## 2018-01-20 MED ORDER — CARVEDILOL 12.5 MG PO TABS
12.5000 mg | ORAL_TABLET | Freq: Two times a day (BID) | ORAL | Status: DC
  Administered 2018-01-20 – 2018-01-24 (×8): 12.5 mg via ORAL
  Filled 2018-01-20 (×8): qty 1

## 2018-01-20 MED ORDER — RIVAROXABAN 20 MG PO TABS
20.0000 mg | ORAL_TABLET | Freq: Every day | ORAL | Status: DC
  Administered 2018-01-21 – 2018-01-23 (×3): 20 mg via ORAL
  Filled 2018-01-20 (×3): qty 1

## 2018-01-20 MED ORDER — MAGNESIUM SULFATE 2 GM/50ML IV SOLN
2.0000 g | Freq: Once | INTRAVENOUS | Status: AC
  Administered 2018-01-20: 2 g via INTRAVENOUS
  Filled 2018-01-20: qty 50

## 2018-01-20 MED ORDER — SULFAMETHOXAZOLE-TRIMETHOPRIM 800-160 MG PO TABS: 2.0000 | ORAL_TABLET | Freq: Two times a day (BID) | ORAL | Status: DC

## 2018-01-20 NOTE — Progress Notes (Signed)
PT Cancellation Note  Patient Details Name: Terry Parks MRN: [id number] DOB: February 02, 1956   Cancelled Treatment:    Reason Eval/Treat Not Completed: Patient declined, no reason specified. Pt continues to decline physical therapy evaluation attempts. Declining mobility today stating, "I'm too weak since I never got my breakfast tray and I'm waiting on lunch." Has been ambulating with mobility tech. Will discharge acute PT order due to multiple refusals. Please reconsult if pt agreeable to participate and requires PT evaluation for mobility assessment. Thank you for this referral.  Mabeline Caras, PT, DPT Acute Rehabilitation Services  Pager [phone] Office [place] 01/20/2018, 1:24 PM

## 2018-01-20 NOTE — Progress Notes (Addendum)
[facility] Progress Note     Subjective: CC: wants to eat Patient reports passing flatus and feels like he is close to being able to have a BM. Tolerated CLD. Denies abdominal pain or nausea. Drainage is getting more clear.   Objective: Vital signs in last 24 hours: Temp:  [98.1 F (36.7 C)-98.5 F (36.9 C)] 98.5 F (36.9 C) (09/06 0450) Pulse Rate:  [93-97] 97 (09/06 0450) Resp:  [18] 18 (09/06 0450) BP: (144-173)/(74-102) 144/74 (09/06 0450) SpO2:  [98 %-100 %] 98 % (09/06 0450) Last BM Date: 01/07/18  Intake/Output from previous day: 09/05 0701 - 09/06 0700 In: 4164.9 [P.O.:720; I.V.:2763.9; IV Piggyback:671] Out: 45 [Drains:45] Intake/Output this shift: Total I/O In: 5 [Other:5] Out: 15 [Drains:15]  PE: KGM:WNUUV, NAD Card: Regular rate and rhythm Pulm: Normal effort, clear to auscultation bilaterally Abd: Soft, non-tender,non-distended, bowel soundspresent,drain present in R abdomenwith serous fluid Skin: warm and dry, no rashes  Psych: A&Ox3  Lab Results:  Recent Labs    01/18/18 0607 01/19/18 0526  WBC 14.0* 10.4  HGB 10.3* 9.3*  HCT 33.8* 30.9*  PLT 349 334   BMET Recent Labs    01/19/18 0526 01/20/18 0427  NA 138 137  K 3.0* 3.5  CL 99 100  CO2 32 27  GLUCOSE 236* 162*  BUN 12 6  CREATININE 1.53* 1.47*  CALCIUM 7.2* 7.6*   PT/INR No results for input(s): LABPROT, INR in the last 72 hours. CMP     Component Value Date/Time   NA 137 01/20/2018 0427   NA 140 08/22/2017 1151   K 3.5 01/20/2018 0427   CL 100 01/20/2018 0427   CO2 27 01/20/2018 0427   GLUCOSE 162 (H) 01/20/2018 0427   BUN 6 01/20/2018 0427   BUN 6 08/22/2017 1151   CREATININE 1.47 (H) 01/20/2018 0427   CREATININE 1.08 07/18/2014 1056   CALCIUM 7.6 (L) 01/20/2018 0427   PROT 6.8 01/13/2018 1640   PROT 6.7 08/22/2017 1151   ALBUMIN 2.3 (L) 01/13/2018 1640   ALBUMIN 3.4 (L) 08/22/2017 1151   AST 47 (H) 01/13/2018 1640   ALT 25 01/13/2018 1640   ALKPHOS 105 01/13/2018 1640   BILITOT 1.3 (H) 01/13/2018 1640   BILITOT 0.3 08/22/2017 1151   GFRNONAA 50 (L) 01/20/2018 0427   GFRNONAA 76 07/18/2014 1056   GFRAA 58 (L) 01/20/2018 0427   GFRAA 88 07/18/2014 1056   Lipase     Component Value Date/Time   LIPASE 19 06/06/2017 1042       Studies/Results: No results found.  Anti-infectives: Anti-infectives (From admission, onward)   Start     Dose/Rate Route Frequency Ordered Stop   01/18/18 1400  vancomycin (VANCOCIN) 1,500 mg in sodium chloride 0.9 % 500 mL IVPB     1,500 mg 250 mL/hr over 120 Minutes Intravenous Every 24 hours 01/18/18 1304     01/17/18 1500  vancomycin (VANCOCIN) 1,750 mg in sodium chloride 0.9 % 500 mL IVPB  Status:  Discontinued     1,750 mg 250 mL/hr over 120 Minutes Intravenous Every 48 hours 01/17/18 1036 01/18/18 1304   01/17/18 1000  ceFEPIme (MAXIPIME) 2 g in sodium chloride 0.9 % 100 mL IVPB  Status:  Discontinued     2 g 200 mL/hr over 30 Minutes Intravenous Every 24 hours 01/16/18 1359 01/17/18 1351   01/16/18 2200  metroNIDAZOLE (FLAGYL) IVPB 500 mg  Status:  Discontinued     500 mg 100 mL/hr over 60 Minutes Intravenous Every 8  hours 01/16/18 1359 01/17/18 1351   01/16/18 1000  ceFEPIme (MAXIPIME) 2 g in sodium chloride 0.9 % 100 mL IVPB  Status:  Discontinued     2 g 200 mL/hr over 30 Minutes Intravenous Every 24 hours 01/16/18 0636 01/16/18 1354   01/16/18 0749  vancomycin variable dose per unstable renal function (pharmacist dosing)  Status:  Discontinued      Does not apply See admin instructions 01/16/18 0749 01/17/18 1037   01/16/18 0645  metroNIDAZOLE (FLAGYL) IVPB 500 mg  Status:  Discontinued     500 mg 100 mL/hr over 60 Minutes Intravenous Every 8 hours 01/16/18 0636 01/16/18 1354   01/15/18 1400  piperacillin-tazobactam (ZOSYN) IVPB 2.25 g  Status:  Discontinued     2.25 g 100 mL/hr over 30 Minutes Intravenous Every 8 hours 01/15/18 1038 01/16/18 0629   01/15/18 1230  vancomycin  (VANCOCIN) 2,000 mg in sodium chloride 0.9 % 500 mL IVPB     2,000 mg 250 mL/hr over 120 Minutes Intravenous  Once 01/15/18 1215 01/15/18 1650   01/14/18 1400  piperacillin-tazobactam (ZOSYN) IVPB 3.375 g  Status:  Discontinued     3.375 g 12.5 mL/hr over 240 Minutes Intravenous Every 8 hours 01/14/18 1052 01/15/18 1038       Assessment/Plan Hx of prostate CA - s/p recent robotic prostatectomy HTN - management per primary team  T2DM - SSI CHFw/ rEF Atrial fibrillation  Large intra-abdominal abscess of unclear etiology - s/p IR drainage 01/14/18 Reactive ileus secondary to above - WBC10.4 yesterday, normalized- drain cxs grew MRSA, abx per primary team  - drain with60 cc out, serous- per IR - advance to FLD and advance as tolerated to CM diet - mobilize!!! AKI - Cr1.47, improving Hypokalemia- K 3.5, improving  FEN: CLD, decrease IVF VTE: SCDs, SQ heparin ID: zosyn 8/31>9/2; flagyl 9/2>9/3, cefepime 9/2>9/3;vanc 9/1>>  Ileus resolving. Advance diet as tolerated. May be nearing readiness for discharge. Would transition to PO abx when tolerating soft diet. Likely needs another 10-14 days of abx, could discuss with ID pharmacist. Follow up with IR and urology. No general surgery follow up needed.   No indications for acute surgical intervention, we will sign off. Please call with questions or concerns.   LOS: 7 days    Brigid Re , [facility] Surgery 01/20/2018, 8:31 AM Pager: [phone] Consults: [phone] Mon-Fri 7:00 am-4:30 pm Sat-Sun 7:00 am-11:30 am

## 2018-01-20 NOTE — Progress Notes (Signed)
Referring Physician(s): Nadeen Landau  Supervising Physician: Marybelle Killings  Patient Status:  [facility] - In-pt  Chief Complaint: None  Subjective:  Intraabdominal abscess s/p drain placement 01/14/2018 by Dr. Annamaria Boots. Patient awake and alert sitting in bed with no complaints at this time. RLQ drain site c/d/i with approximately 25 mL of cloudy serous fluid. Drain flushes/aspirates without resistance.   Allergies: Lisinopril  Medications: Prior to Admission medications   Medication Sig Start Date End Date Taking? Authorizing Provider  acetaminophen (TYLENOL) 325 MG tablet Take 325-650 mg by mouth every 6 (six) hours as needed (for pain).   Yes [provider]  atorvastatin (LIPITOR) 40 MG tablet Take 40 mg by mouth daily. 10/14/17  Yes [provider]  carvedilol (COREG) 12.5 MG tablet Take 1 tablet (12.5 mg total) by mouth 2 (two) times daily with a meal. 10/14/17 01/13/18 Yes [name], Vahini, MD  DULoxetine (CYMBALTA) 60 MG capsule Take 1 capsule (60 mg total) by mouth daily. 10/14/17 10/14/18 Yes [name], Vahini, MD  furosemide (LASIX) 40 MG tablet Take 40 mg by mouth 2 (two) times daily. 01/03/18  Yes [provider]  Insulin Glargine (LANTUS SOLOSTAR) 100 UNIT/ML Solostar Pen Inject 60 Units into the skin daily before breakfast.   Yes [provider]  isosorbide mononitrate (IMDUR) 30 MG 24 hr tablet Take 1 tablet (30 mg total) by mouth daily. 07/06/17  Yes Forde Dandy, PharmD  metFORMIN (GLUCOPHAGE-XR) 500 MG 24 hr tablet Take 2 tablets (1,000 mg total) by mouth 2 (two) times daily with a meal. 01/13/18  Yes Forde Dandy, PharmD  nystatin cream (MYCOSTATIN) Apply 1 application topically See admin instructions. Apply to arms and legs 2 times a day   Yes [provider]  rivaroxaban (XARELTO) 20 MG TABS tablet Take 20 mg by mouth daily with breakfast.   Yes [provider]  amiodarone (PACERONE) 200 MG tablet Take 1 tablet (200  mg total) by mouth daily. 10/14/17 01/13/18  [name], [name], MD  amLODipine-atorvastatin (CADUET) 5-40 MG tablet Take 1 tablet by mouth daily. 10/14/17   [name], [name], MD  gabapentin (NEURONTIN) 300 MG capsule Take 1 capsule (300 mg total) by mouth daily. 10/14/17 01/13/18  [name], [name], MD  glucose blood (TRUETRACK TEST) test strip Use as directed to test blood sugar three times a day before meals. 10/04/12   Bertha Stakes, MD  Insulin Glargine (BASAGLAR KWIKPEN) 100 UNIT/ML SOPN Inject 0.6 mLs (60 Units total) into the skin daily. Patient not taking: Reported on 01/13/2018 01/11/18   [name], MD  Insulin Pen Needle (CARETOUCH PEN NEEDLES) 31G X 6 MM MISC 1 pen by Does not apply route at bedtime. 03/01/17   [name], MD  Lancets MISC Use to test blood sugar three times a day before meals. 09/14/13   Malena Catholic, MD  losartan (COZAAR) 50 MG tablet Take 1 tablet (50 mg total) by mouth daily. 10/14/17   [name], MD  Misc. Devices MISC by Does not apply route. C-PAP    [provider]     Vital Signs: BP (!) 144/74 (BP Location: Right Arm)   Pulse 97   Temp 98.5 F (36.9 C) (Oral)   Resp 18   Ht 6\' 1"  (1.854 m)   Wt 284 lb 9.8 oz (129.1 kg)   SpO2 98%   BMI 37.55 kg/m   Physical Exam  Constitutional: He is oriented to person, place, and time. He appears well-developed and well-nourished. No distress.  Pulmonary/Chest: Effort normal. No respiratory distress.  Abdominal:  RLQ drain site without erythema, drainage, or tenderness; approximately 25 mL of cloudy serous fluid in JP; drain flushes/aspirates without resistance.  Neurological: He is alert and oriented to person, place, and time.  Skin: Skin is warm and dry.  Psychiatric: He has a normal mood and affect. His behavior is normal. Judgment and thought content normal.  Nursing note and vitals reviewed.   Imaging: No results found.  Labs:  CBC: Recent Labs    01/16/18 0450 01/17/18 0532  01/18/18 0607 01/19/18 0526  WBC 21.1* 17.5* 14.0* 10.4  HGB 10.8* 10.2* 10.3* 9.3*  HCT 34.8* 32.7* 33.8* 30.9*  PLT 372 380 349 334    COAGS: Recent Labs    06/10/17 1414 06/11/17 0422 06/11/17 1600 09/26/17 1149  INR 2.41 2.14 1.86 1.09    BMP: Recent Labs    01/17/18 0446 01/18/18 0607 01/19/18 0526 01/20/18 0427  NA 140 139 138 137  K 3.8 3.0* 3.0* 3.5  CL 100 98 99 100  CO2 26 31 32 27  GLUCOSE 71 178* 236* 162*  BUN 43* 24* 12 6  CALCIUM 7.6* 8.0* 7.2* 7.6*  CREATININE 3.39* 1.96* 1.53* 1.47*  GFRNONAA 18* 35* 48* 50*  GFRAA 21* 41* 55* 58*    LIVER FUNCTION TESTS: Recent Labs    06/07/17 0258  06/13/17 0337 08/22/17 1151 01/08/18 0934 01/13/18 1640  BILITOT 1.4*  --   --  0.3 1.7* 1.3*  AST 24  --   --  18 18 47*  ALT 18  --   --  15 16 25   ALKPHOS 69  --   --  84 91 105  PROT 6.4*  --   --  6.7 7.3 6.8  ALBUMIN 2.8*   < > 2.4* 3.4* 3.0* 2.3*   < > = values in this interval not displayed.    Assessment and Plan:  Intraabdominal abscess s/p drain placement 01/14/2018 by Dr. Annamaria Boots. RLQ drain stable with approximately 25 mL of cloudy serous fluid. Continue with Qshift flushes and monitoring of output. Appreciate and agree with [facility] management. IR to follow.   Electronically Signed: Earley Abide, PA-C 01/20/2018, 10:30 AM   I spent a total of 15 Minutes at the the patient's bedside AND on the patient's hospital floor or unit, greater than 50% of which was counseling/coordinating care for intraabdominal abscess s/p drain placement.

## 2018-01-20 NOTE — Progress Notes (Addendum)
   Subjective:  Overnight: NGT was taken out  Today, [name] was examined at bedside and reports of doing well. He has been passing flatus, more than usual but no bowel movement yet. He has also been ambulating in the hallways and working with physical therapy. He is tolerating clear liquid diet and asking for crackers. He denies orthopnea, SOB, abdominal pain, nausea, vomiting, fevers or chills. His only complain was dysphagia to potassium pill which we assured him could be switched to liquid or powder.   Objective:  Vital signs in last 24 hours: Vitals:   01/19/18 1500 01/19/18 2147 01/19/18 2220 01/20/18 0450  BP: (!) 155/74 (!) 173/102 (!) 170/97 (!) 144/74  Pulse: 93 94  97  Resp: 18 18  18   Temp: 98.5 F (36.9 C) 98.1 F (36.7 C)  98.5 F (36.9 C)  TempSrc: Oral Oral  Oral  SpO2: 100% 100%  98%  Weight:      Height:       Const: in NAD, very pleasant and interactive gentleman  CV: RRR, no MGR Resp: CTABL, no wheezes, crackles, rhonchi  Abd: BS+ , NTTP, JP drain at RLQ serous/cloudy fluid  Extremity: 2+ bilateral pitting edema  Assessment/Plan:  Active Problems:   SBO (small bowel obstruction) (HCC)   Pelvic abscess in male [facility])   Intra-abdominal abscess (HCC)  Mr.Bellamyis a 62 year old African-American gentleman with type 2 diabetes mellitus, hypertension,HFrEF EF 35-40% in 2019,prostate cancers/probotic assisted laparoscopic radical prostatectomy05/2019currently being managed for small bowel obstruction and pelvic abscess- HD7  Small bowel obstruction, pelvic abscess:Passing more flatus now and reports he feels like having a bowel movement. NGT has been removed and is tolerating clear liquid diet. Continues to ambulate and working with PT. Remains afebrile and leukocytosis has resolved.  -Per surgery: Advance to full liquid diet and advance as tolerated, decrease D5-1/5 NS to 44mL/hr -Continue IV Vancomycin (dosing per pharmacy)  -Pain regimen: Norco 5-3  25q8hprn severe abdominal pain -Ambulate with nursing, PT/OT. -Pending duration of IV antibiotic and choice of oral antibiotic per general surgery -CSW to order Home health PT and Drain Care   AoCKD: Improving with AM Cr. Of 1.47  Diabetes Mellitus: Continue Lantus 15U qhs and SSI-resistant.   Hypokalemia: Improving, however low normal at 3.5. Will continue D5-1/5 NS with KCL 43mEQ at 48mL/hr for another 24 hours  A-Fibb:Continues to remainin SR. Start Xarelto today and resume Amiodarone at discharge  Hypertension: Patient tolerating PO and will thus start Coreg 12.5mg  BID, Lasix 40mg  BID, Losartan -Hold amlodipine 5mg    Heart Failure with reduced Ejection Fraction:Stable however noticeable 2+ lower extremity edema bilaterally  -Start Coreg 12.5mg  BID, lasix 40mg  BID -Hold Imdur 30mg  daily  Dispo: Anticipated discharge pending clinical improvement and transition to oral antibiotics.   Jean Rosenthal, MD 01/20/2018, 12:16 PM Pager: [phone] IMTS PGY-1

## 2018-01-20 NOTE — Care Management Note (Signed)
Case Management Note  Patient Details  Name: Terry Parks MRN: [id number] Date of Birth: [birth year]  Subjective/Objective:                    Action/Plan:   Expected Discharge Date:                  Expected Discharge Plan:  [place]  In-House Referral:  Financial Counselor  Discharge planning Services  CM Consult, [place] Program, Medication Assistance  Post Acute Care Choice:  Home Health Choice offered to:  Patient  DME Arranged:  N/A DME Agency:  NA  HH Arranged:  RN, PT [place] Agency:  [place]  Status of Service:  Completed, signed off  If discussed at [place] of Stay Meetings, dates discussed:    Additional Comments:  Marilu Favre, RN 01/20/2018, 1:22 PM

## 2018-01-21 DIAGNOSIS — E1140 Type 2 diabetes mellitus with diabetic neuropathy, unspecified: Secondary | ICD-10-CM

## 2018-01-21 LAB — HEPATIC FUNCTION PANEL
ALK PHOS: 47 U/L (ref 38–126)
ALT: 14 U/L (ref 0–44)
AST: 22 U/L (ref 15–41)
Albumin: 1.8 g/dL — ABNORMAL LOW (ref 3.5–5.0)
BILIRUBIN DIRECT: 0.2 mg/dL (ref 0.0–0.2)
BILIRUBIN TOTAL: 0.5 mg/dL (ref 0.3–1.2)
Indirect Bilirubin: 0.3 mg/dL (ref 0.3–0.9)
TOTAL PROTEIN: 6.5 g/dL (ref 6.5–8.1)

## 2018-01-21 LAB — BASIC METABOLIC PANEL
Anion gap: 7 (ref 5–15)
BUN: 5 mg/dL — ABNORMAL LOW (ref 6–20)
CHLORIDE: 102 mmol/L (ref 98–111)
CO2: 26 mmol/L (ref 22–32)
Calcium: 7.5 mg/dL — ABNORMAL LOW (ref 8.9–10.3)
Creatinine, Ser: 1.43 mg/dL — ABNORMAL HIGH (ref 0.61–1.24)
GFR calc Af Amer: >60 mL/min (ref >60)
GFR calc non Af Amer: 52 mL/min — ABNORMAL LOW (ref >60)
GLUCOSE: 141 mg/dL — AB (ref 70–99)
Potassium: 4.1 mmol/L (ref 3.5–5.1)
Sodium: 135 mmol/L (ref 135–145)

## 2018-01-21 LAB — MAGNESIUM: Magnesium: 1.6 mg/dL — ABNORMAL LOW (ref 1.7–2.4)

## 2018-01-21 LAB — GLUCOSE, CAPILLARY
GLUCOSE-CAPILLARY: 174 mg/dL — AB (ref 70–99)
GLUCOSE-CAPILLARY: 204 mg/dL — AB (ref 70–99)
Glucose-Capillary: 132 mg/dL — ABNORMAL HIGH (ref 70–99)
Glucose-Capillary: 144 mg/dL — ABNORMAL HIGH (ref 70–99)
Glucose-Capillary: 159 mg/dL — ABNORMAL HIGH (ref 70–99)
Glucose-Capillary: 179 mg/dL — ABNORMAL HIGH (ref 70–99)
Glucose-Capillary: 222 mg/dL — ABNORMAL HIGH (ref 70–99)

## 2018-01-21 MED ORDER — INSULIN ASPART 100 UNIT/ML ~~LOC~~ SOLN: 0.0000 [IU] | Freq: Three times a day (TID) | SUBCUTANEOUS | Status: DC

## 2018-01-21 MED ORDER — AMLODIPINE-ATORVASTATIN 5-40 MG PO TABS: 1.0000 | ORAL_TABLET | Freq: Every day | ORAL | Status: DC

## 2018-01-21 MED ORDER — MAGNESIUM SULFATE 50 % IJ SOLN
1.0000 g | Freq: Once | INTRAMUSCULAR | Status: AC
  Administered 2018-01-21: 1 g via INTRAVENOUS
  Filled 2018-01-21 (×2): qty 2

## 2018-01-21 MED ORDER — HYDROCERIN EX CREA
TOPICAL_CREAM | Freq: Two times a day (BID) | CUTANEOUS | Status: DC
  Administered 2018-01-22 – 2018-01-23 (×4): via TOPICAL
  Filled 2018-01-21: qty 113

## 2018-01-21 MED ORDER — ATORVASTATIN CALCIUM 40 MG PO TABS
40.0000 mg | ORAL_TABLET | Freq: Every day | ORAL | Status: DC
  Administered 2018-01-21 – 2018-01-23 (×3): 40 mg via ORAL
  Filled 2018-01-21 (×3): qty 1

## 2018-01-21 MED ORDER — AMLODIPINE BESYLATE 5 MG PO TABS
5.0000 mg | ORAL_TABLET | Freq: Every day | ORAL | Status: DC
  Administered 2018-01-21 – 2018-01-24 (×4): 5 mg via ORAL
  Filled 2018-01-21 (×4): qty 1

## 2018-01-21 MED ORDER — INSULIN ASPART 100 UNIT/ML ~~LOC~~ SOLN
0.0000 [IU] | Freq: Three times a day (TID) | SUBCUTANEOUS | Status: DC
  Administered 2018-01-22 (×3): 4 [IU] via SUBCUTANEOUS
  Administered 2018-01-23: 7 [IU] via SUBCUTANEOUS
  Administered 2018-01-23 – 2018-01-24 (×4): 4 [IU] via SUBCUTANEOUS

## 2018-01-21 MED ORDER — INSULIN ASPART 100 UNIT/ML ~~LOC~~ SOLN
0.0000 [IU] | Freq: Every day | SUBCUTANEOUS | Status: DC
  Administered 2018-01-21 – 2018-01-22 (×2): 2 [IU] via SUBCUTANEOUS

## 2018-01-21 NOTE — Plan of Care (Signed)
  Problem: Clinical Measurements: Goal: Ability to maintain clinical measurements within normal limits will improve Outcome: Progressing   Problem: Activity: Goal: Risk for activity intolerance will decrease Outcome: Progressing   Problem: Health Behavior/Discharge Planning: Goal: Ability to manage health-related needs will improve Outcome: Progressing

## 2018-01-21 NOTE — Progress Notes (Signed)
Eucerin cream ordered for extremely dry skin.  Placed the dermatherapy pillow cases inside of pt groin areas where it has gotten moist due to incontinence (from prostate cancer).  Attempting to use a condom catheter today so the area will stay dry.  Nystatin cream applied to area as well.

## 2018-01-21 NOTE — Progress Notes (Signed)
0432 pt refused insulin, states if below 160 at home he doesn't take. Offered to take it at next scheduled time if needed.

## 2018-01-21 NOTE — Progress Notes (Addendum)
   Subjective:  Overnight: No acute events  Today, Terry Parks Parks was examined at bedside and reports that he is doing well. He tolerated his carb modified diet with no issues. He is passing more flatus but no bowel movement yet. He denies abdominal pain, nausea, vomiting, fevers, chills. His only complaint is neuropathic pain at his lower extremity for which he states gabapentin has not helped in the past.   Objective:  Vital signs in last 24 hours: Vitals:   01/20/18 0450 01/20/18 1500 01/20/18 2100 01/21/18 0434  BP: (!) 144/74 (!) 157/85 140/72 (!) 145/98  Pulse: 97 (!) 104 95 99  Resp: 18  18 18   Temp: 98.5 F (36.9 C) 97.6 F (36.4 C) 98 F (36.7 C) 97.9 F (36.6 C)  TempSrc: Oral Oral Oral Oral  SpO2: 98% 100% 100% 100%  Weight:      Height:       Const: in NAD, lying in bed CV: RRR, no MGR Resp: CTABL, no wheezes, crackles, rhonchi  Abd: JP brain at RLQ with cloudy sero-sanguinous fluid, BS+, NTTP Extremity: 1+ pitting edema bilaterally, DP pulse intact  Assessment/Plan:  Active Problems:   SBO (small bowel obstruction) (HCC)   Pelvic abscess in male [facility])   Intra-abdominal abscess (HCC)  TerryBellamyis a 62 year old African-American gentleman with type 2 diabetes mellitus, hypertension,HFrEF EF 35-40% in 2019,prostate cancers/probotic assisted laparoscopic radical prostatectomy05/2019currently being managed for small bowel obstruction and pelvic abscess- HD8  Small bowel obstruction, pelvic abscess: Noticeable improvement daily. Passing flatus, no BM. Remains afebrile and without leukocytosis. Transitioned to Bactrim today. PE with no abdominal tenderness, JP drain with cloudy sero-sanguinous fluid -Continue Bactrim 800-160mg  BID for 10 days (Last day 01/30/18) -Continue bowel regimen with miralax, colace  -Encourage ambulation -Pain regimen: Norco 5-3 25q8hprn severe abdominal pain -Diet as tolerated  AoCKD: Stable Cr 1.43<<1.47  Atrial Fibrillation: SR.  Continue Xarelto -Resume amiodarone at discharge   Hypertension: Continue Coreg 12.5mg  BID, Lasix 40mg  BID, Losartan, Amlodipine 5mg    HFrEF: Continue Coreg 12.5mg  BID, lasix 40mg  BID -Hold Imdur 30mg  daily  Hypomagnesemia: 1.6<<1.5 -s/p Magnsium Sulfate 2gm -Will replete   Dispo: Anticipated discharge pending clinical improvement, IR reccs.  Jean Rosenthal, MD 01/21/2018, 7:00 AM Pager: [phone] IMTS PGY-1

## 2018-01-22 LAB — MAGNESIUM
MAGNESIUM: 1.5 mg/dL — AB (ref 1.7–2.4)
Magnesium: 1.5 mg/dL — ABNORMAL LOW (ref 1.7–2.4)

## 2018-01-22 LAB — GLUCOSE, CAPILLARY
GLUCOSE-CAPILLARY: 179 mg/dL — AB (ref 70–99)
GLUCOSE-CAPILLARY: 165 mg/dL — AB (ref 70–99)
GLUCOSE-CAPILLARY: 226 mg/dL — AB (ref 70–99)
Glucose-Capillary: 169 mg/dL — ABNORMAL HIGH (ref 70–99)

## 2018-01-22 MED ORDER — MAGNESIUM SULFATE 50 % IJ SOLN: 2.0000 g | Freq: Once | INTRAMUSCULAR | Status: DC

## 2018-01-22 MED ORDER — MAGNESIUM SULFATE 2 GM/50ML IV SOLN
2.0000 g | Freq: Once | INTRAVENOUS | Status: AC
  Administered 2018-01-22: 2 g via INTRAVENOUS
  Filled 2018-01-22: qty 50

## 2018-01-22 NOTE — Progress Notes (Addendum)
   Subjective:  Overnight: Adjusted night time insulin dose   Today, Terry Parks was examined at bedside and reported he is doing well with exception of neuropathic lower extremity pain. He states that in the past massages had alleviated his pain. He continues to pass flatus but no BM yet. He also denies abdominal pain, fevers, chills, nausea, vomiting. He is tolerating his diet with no difficulties.  Objective:  Vital signs in last 24 hours: Vitals:   01/21/18 0434 01/21/18 1501 01/21/18 2145 01/22/18 0506  BP: (!) 145/98 138/90 (!) 151/90 (!) 161/98  Pulse: 99 93 98 91  Resp: 18 16 20 19   Temp: 97.9 F (36.6 C) 97.6 F (36.4 C) 98.1 F (36.7 C) 97.8 F (36.6 C)  TempSrc: Oral Oral Oral Oral  SpO2: 100% 99% 99% 99%  Weight:      Height:       Const: In NAD, sitting on bed CV: RRR, no MGR Resp: CTABL, no wheezes, crackles, rhonchi  Abd: BS+, NTTP, distended secondary to body habitus, JP drain with serosanguinous output Ext: Dry lower ext bilaterally, 1+ pitting edema at the foot  Assessment/Plan:  Active Problems:   SBO (small bowel obstruction) (HCC)   Pelvic abscess in male [facility])   Intra-abdominal abscess (HCC)  Terry.Bellamyis a 62 year old African-American gentleman with type 2 diabetes mellitus, hypertension,HFrEF EF 35-40% in 2019,prostate cancers/probotic assisted laparoscopic radical prostatectomy05/2019currently being managed for small bowel obstructionandpelvic abscess- HD9   Small bowel obstruction, pelvic abscess: He remains afebrile, tolerating diet, ambulating and passing gas. Abdominal exam benign. IR continues to follow for JP drain care which had 50cc serosanguinous fluid in past24 hours.  -Continue Bactrim 800-160mg  BID for 10 days (Last day 01/30/18) -Continue bowel regimen with miralax, colace  -Encourage ambulation -Pain regimen: Norco 5-3 25q8hprnsevere abdominal pain -Diet as tolerated  Neuropathic Pain: Refuses gabapentin as he states  has not helped in the past. States that home massages have helped him in the past -Trial of SCDs   AcCKD: Improved. Cr this am 1.43  HTN: BP elevated this AM. Will continue Coreg 12.5mg  BID, Lasix 40mg  BID, Losartan, Amlodipine 5mg   HFrEF: Continue Coreg 12.5mg  BID, lasix 40mg  BID -HoldImdur 30mg  daily  Hypomagnesemia: 1.5 this AM.  -Will replete   Dispo: Anticipated discharge once clinically improved.   Jean Rosenthal, MD 01/22/2018, 8:31 AM Pager: [phone] IMTS PGY-1

## 2018-01-23 ENCOUNTER — Inpatient Hospital Stay (HOSPITAL_COMMUNITY): Payer: Medicaid Other

## 2018-01-23 LAB — BASIC METABOLIC PANEL
Anion gap: 9 (ref 5–15)
BUN: 10 mg/dL (ref 6–20)
CALCIUM: 7.6 mg/dL — AB (ref 8.9–10.3)
CHLORIDE: 106 mmol/L (ref 98–111)
CO2: 20 mmol/L — ABNORMAL LOW (ref 22–32)
CREATININE: 1.51 mg/dL — AB (ref 0.61–1.24)
GFR calc Af Amer: 56 mL/min — ABNORMAL LOW (ref >60)
GFR calc non Af Amer: 49 mL/min — ABNORMAL LOW (ref >60)
Glucose, Bld: 186 mg/dL — ABNORMAL HIGH (ref 70–99)
Potassium: 5 mmol/L (ref 3.5–5.1)
SODIUM: 135 mmol/L (ref 135–145)

## 2018-01-23 LAB — GLUCOSE, CAPILLARY
GLUCOSE-CAPILLARY: 168 mg/dL — AB (ref 70–99)
Glucose-Capillary: 228 mg/dL — ABNORMAL HIGH (ref 70–99)
Glucose-Capillary: 163 mg/dL — ABNORMAL HIGH (ref 70–99)
Glucose-Capillary: 186 mg/dL — ABNORMAL HIGH (ref 70–99)

## 2018-01-23 LAB — MAGNESIUM: MAGNESIUM: 1.6 mg/dL — AB (ref 1.7–2.4)

## 2018-01-23 MED ORDER — GLUCERNA SHAKE PO LIQD
237.0000 mL | Freq: Three times a day (TID) | ORAL | Status: DC
  Administered 2018-01-23 – 2018-01-24 (×3): 237 mL via ORAL

## 2018-01-23 NOTE — Progress Notes (Signed)
Nutrition Follow-up  DOCUMENTATION CODES:   Obesity unspecified  INTERVENTION:   -Continue MVI with minerals daily -D/c Juven due to poor acceptance -Glucerna Shake po TID, each supplement provides 220 kcal and 10 grams of protein -Consider initiation of bowel regimen; no BM since 01/09/18 (miralax started on 01/19/18)  NUTRITION DIAGNOSIS:   Inadequate oral intake related to altered GI function as evidenced by NPO status(NPO/CLD x 6 days).  Progressing  GOAL:   Patient will meet greater than or equal to 90% of their needs  Progressing  MONITOR:   PO intake, Supplement acceptance, Diet advancement, Labs, Weight trends, Skin, I & O's  REASON FOR ASSESSMENT:   NPO/Clear Liquid Diet    ASSESSMENT:   Terry Parks is a 62 year old man with a history of diabetes, hypertension, chronic systolic heart failure, morbid obesity, obstructive sleep apnea, and prostate cancer status post robot-assisted laparoscopic radical prostatectomy in late May 2019 who presents with a six-day history of nausea, vomiting, and abdominal pain.   8/31- NGT placed, s/p CT drainage of pelvic abscess (185 ml aspirated) and rain placement 9/2- hypoglycemic event 9/3- hypoglycemic event 9/4- advanced to clear liquids 9/5- NGT removed 9/6- advanced to carb modified diet  Reviewed I/O's: +15 ml for drains. +370 ml x 24 hours and +159 ml since admission.   Per IR notes, plan to check CT today to reassess pelvic collections.   Pt sleeping soundly at time of visit. Noted meal completion 100%. Pt is refusing Juven supplements.   Labs reviewed: CBGS: 165-226 (inpatient orders for glycemic control are 0-20 units insulin aspart TID with melas, 0-5 units insulin aspart q HS, and 15 units insulin aspart q HS).   Diet Order:   Diet Order            Diet Carb Modified Fluid consistency: Thin; Room service appropriate? Yes  Diet effective now              EDUCATION NEEDS:   Not appropriate for education  at this time  Skin:  Skin Assessment: Reviewed RN Assessment  Last BM:  01/11/18  Height:   Ht Readings from Last 1 Encounters:  01/13/18 6\' 1"  (1.854 m)    Weight:   Wt Readings from Last 1 Encounters:  01/13/18 129.1 kg    Ideal Body Weight:  83.6 kg  BMI:  Body mass index is 37.55 kg/m.  Estimated Nutritional Needs:   Kcal:  2300-2500  Protein:  115-130 grams  Fluid:  2.3-2.5 L    Katlin Bortner A. Jimmye Norman, RD, LDN, CDE Pager: [phone] After hours Pager: [phone]

## 2018-01-23 NOTE — Progress Notes (Signed)
Referring Physician(s): White,C  Supervising Physician: Arne Cleveland  Patient Status:  [facility] - In-pt  Chief Complaint:  Pelvic abscess  Subjective: Pt doing ok; no BM yet; denies worsening abd pain,N/V   Allergies: Lisinopril  Medications: Prior to Admission medications   Medication Sig Start Date End Date Taking? Authorizing Provider  acetaminophen (TYLENOL) 325 MG tablet Take 325-650 mg by mouth every 6 (six) hours as needed (for pain).   Yes [provider]  atorvastatin (LIPITOR) 40 MG tablet Take 40 mg by mouth daily. 10/14/17  Yes [provider]  carvedilol (COREG) 12.5 MG tablet Take 1 tablet (12.5 mg total) by mouth 2 (two) times daily with a meal. 10/14/17 01/13/18 Yes [name], Vahini, MD  DULoxetine (CYMBALTA) 60 MG capsule Take 1 capsule (60 mg total) by mouth daily. 10/14/17 10/14/18 Yes [name], Vahini, MD  furosemide (LASIX) 40 MG tablet Take 40 mg by mouth 2 (two) times daily. 01/03/18  Yes [provider]  Insulin Glargine (LANTUS SOLOSTAR) 100 UNIT/ML Solostar Pen Inject 60 Units into the skin daily before breakfast.   Yes [provider]  isosorbide mononitrate (IMDUR) 30 MG 24 hr tablet Take 1 tablet (30 mg total) by mouth daily. 07/06/17  Yes Forde Dandy, PharmD  metFORMIN (GLUCOPHAGE-XR) 500 MG 24 hr tablet Take 2 tablets (1,000 mg total) by mouth 2 (two) times daily with a meal. 01/13/18  Yes Forde Dandy, PharmD  nystatin cream (MYCOSTATIN) Apply 1 application topically See admin instructions. Apply to arms and legs 2 times a day   Yes [provider]  rivaroxaban (XARELTO) 20 MG TABS tablet Take 20 mg by mouth daily with breakfast.   Yes [provider]  amiodarone (PACERONE) 200 MG tablet Take 1 tablet (200 mg total) by mouth daily. 10/14/17 01/13/18  [name], [name], MD  amLODipine-atorvastatin (CADUET) 5-40 MG tablet Take 1 tablet by mouth daily. 10/14/17   [name], [name], MD  gabapentin (NEURONTIN) 300  MG capsule Take 1 capsule (300 mg total) by mouth daily. 10/14/17 01/13/18  [name], [name], MD  glucose blood (TRUETRACK TEST) test strip Use as directed to test blood sugar three times a day before meals. 10/04/12   Bertha Stakes, MD  Insulin Glargine (BASAGLAR KWIKPEN) 100 UNIT/ML SOPN Inject 0.6 mLs (60 Units total) into the skin daily. Patient not taking: Reported on 01/13/2018 01/11/18   [name], MD  Insulin Pen Needle (CARETOUCH PEN NEEDLES) 31G X 6 MM MISC 1 pen by Does not apply route at bedtime. 03/01/17   [name], MD  Lancets MISC Use to test blood sugar three times a day before meals. 09/14/13   Malena Catholic, MD  losartan (COZAAR) 50 MG tablet Take 1 tablet (50 mg total) by mouth daily. 10/14/17   [name], MD  Misc. Devices MISC by Does not apply route. C-PAP    [provider]     Vital Signs: BP (!) 141/88 (BP Location: Right Arm)   Pulse 89   Temp 97.7 F (36.5 C) (Oral)   Resp 16   Ht 6\' 1"  (1.854 m)   Wt 284 lb 9.8 oz (129.1 kg)   SpO2 100%   BMI 37.55 kg/m   Physical Exam RLQ drain intact, output 15 cc yellow fluid; insertion site ok, not sig tender  Imaging: No results found.  Labs:  CBC: Recent Labs    01/16/18 0450 01/17/18 0532 01/18/18 0607 01/19/18 0526  WBC 21.1* 17.5* 14.0* 10.4  HGB 10.8* 10.2*  10.3* 9.3*  HCT 34.8* 32.7* 33.8* 30.9*  PLT 372 380 349 334    COAGS: Recent Labs    06/10/17 1414 06/11/17 0422 06/11/17 1600 09/26/17 1149  INR 2.41 2.14 1.86 1.09    BMP: Recent Labs    01/19/18 0526 01/20/18 0427 01/21/18 0556 01/23/18 0725  NA 138 137 135 135  K 3.0* 3.5 4.1 5.0  CL 99 100 102 106  CO2 32 27 26 20*  GLUCOSE 236* 162* 141* 186*  BUN 12 6 5* 10  CALCIUM 7.2* 7.6* 7.5* 7.6*  CREATININE 1.53* 1.47* 1.43* 1.51*  GFRNONAA 48* 50* 52* 49*  GFRAA 55* 58* >60 56*    LIVER FUNCTION TESTS: Recent Labs    08/22/17 1151 01/08/18 0934 01/13/18 1640 01/21/18 0556  BILITOT 0.3 1.7*  1.3* 0.5  AST 18 18 47* 22  ALT 15 16 25 14   ALKPHOS 84 91 105 47  PROT 6.7 7.3 6.8 6.5  ALBUMIN 3.4* 3.0* 2.3* 1.8*    Assessment and Plan: Pt with hx prostate ca, s/p robotic prostatectomy with pelvic LN dissection 10/06/17; now with pelvic abscess, s/p drain 8/31; fluid cx- MRSA; afebrile; creat 1.51; will check f/u CT today to reassess pelvic collections   Electronically Signed: D. Rowe Robert, PA-C 01/23/2018, 10:01 AM   I spent a total of 15 minutes at the the patient's bedside AND on the patient's hospital floor or unit, greater than 50% of which was counseling/coordinating care for pelvic drain    Patient ID: Terry Parks, male   DOB: 12-13-1955, 62 y.o.   MRN: [id number]

## 2018-01-23 NOTE — Progress Notes (Signed)
Inpatient Diabetes Program Recommendations  AACE/ADA: New Consensus Statement on Inpatient Glycemic Control (2019)  Target Ranges:  Prepandial:   less than 140 mg/dL      Peak postprandial:   less than 180 mg/dL (1-2 hours)      Critically ill patients:  140 - 180 mg/dL   Results for MAYSIN, CARSTENS (MRN [id number]) as of 01/23/2018 14:10  Ref. Range 01/22/2018 07:21 01/22/2018 12:16 01/22/2018 16:48 01/22/2018 21:54 01/23/2018 07:52 01/23/2018 13:00  Glucose-Capillary Latest Ref Range: 70 - 99 mg/dL 169 (H) 179 (H) 165 (H) 226 (H) 168 (H) 228 (H)   Review of Glycemic Control  Diabetes history: DM2 Outpatient Diabetes medications: Lantus 60 units QAM, Metformin XR 1000 mg BID Current orders for Inpatient glycemic control: Lantus 15 units QHS, Novolog 0-20 units TID with meals, Novolog 0-5 units QHS  Inpatient Diabetes Program Recommendations:  Insulin - Meal Coverage: Please consider ordering Novolog 3 units TID with meals for meal coverage if patient eats at least 50% of meals.  Thanks, Barnie Alderman, RN, MSN, CDE Diabetes Coordinator Inpatient Diabetes Program [phone] (Team Pager from 8am to 5pm)

## 2018-01-23 NOTE — Progress Notes (Signed)
   Subjective:   Today, [name] was examined at bedside and reported that he "feels chippery" today as he was in a good mood. He states he is doing pretty well and denies abdominal pain, nausea, vomiting. He is passing gas but still no BM. Also, reports of improvement with neuropathic lower extremity pain with SCDs.   Objective:  Vital signs in last 24 hours: Vitals:   01/22/18 0958 01/22/18 1651 01/22/18 2058 01/23/18 0504  BP: (!) 154/94 133/90 137/88 (!) 141/88  Pulse:  92 95 89  Resp:  16 18 16   Temp:  98 F (36.7 C) 98.3 F (36.8 C) 97.7 F (36.5 C)  TempSrc:  Axillary Oral Oral  SpO2:  94% 99% 100%  Weight:      Height:       Const: In NAD, lying in bed CV: RRR, no MGR Resp: CTABL, no wheezes, crackles, rhonchi  Abd: BS+, NTTP, JP drain at RLQ with serous fluid  Assessment/Plan:  Active Problems:   SBO (small bowel obstruction) (HCC)   Pelvic abscess in male [facility])   Intra-abdominal abscess (HCC)  Mr.Bellamyis a 62 year old African-American gentleman with type 2 diabetes mellitus, hypertension,HFrEF EF 35-40% in 2019,prostate cancers/probotic assisted laparoscopic radical prostatectomy05/2019currently being managed for small bowel obstructionandpelvic abscess- HD10  Small bowel obstruction, pelvic abscess: Passing flatus but still no BM. Afebrile, tolerating PO diet, ambulating. Will try coffee today to help with BM.  -Continue Bactrim 800-160mg  BID for 10 days (Last day 01/30/18) -Continue bowel regimen with miralax, colace. Will also encourage prune juice -Encourage ambulation -Pain regimen: Norco 5-3 [id number] abdominal pain -Diet as tolerated -IR will obtain follow up CT abdomen today  Neuropathic Pain:  Continue SCDs (to massage calves)  AcCKD: Stable, slight bump in Cr. 1.51<<1.43. Encourage fluid intake  HTN: ContinueCoreg 12.5mg  BID, Lasix 40mg  BID, Losartan, Amlodipine 5mg   HFrEF: ContinueCoreg 12.5mg  BID, lasix 40mg   BID -HoldImdur 30mg  daily -Daily weights (dry weight 132.2 kg)  Hypomagnesemia: improving. 1.6 this AM <<1.5  -Will replete   Dispo: Anticipated discharge once clinically stable  Jean Rosenthal, MD 01/23/2018, 11:35 AM Pager: [phone] IMTS PGY-1

## 2018-01-24 LAB — BASIC METABOLIC PANEL
ANION GAP: 7 (ref 5–15)
BUN: 11 mg/dL (ref 6–20)
CALCIUM: 8 mg/dL — AB (ref 8.9–10.3)
CO2: 23 mmol/L (ref 22–32)
Chloride: 102 mmol/L (ref 98–111)
Creatinine, Ser: 1.52 mg/dL — ABNORMAL HIGH (ref 0.61–1.24)
GFR calc Af Amer: 56 mL/min — ABNORMAL LOW (ref >60)
GFR calc non Af Amer: 48 mL/min — ABNORMAL LOW (ref >60)
Glucose, Bld: 194 mg/dL — ABNORMAL HIGH (ref 70–99)
Potassium: 4.6 mmol/L (ref 3.5–5.1)
Sodium: 132 mmol/L — ABNORMAL LOW (ref 135–145)

## 2018-01-24 LAB — GLUCOSE, CAPILLARY
Glucose-Capillary: 166 mg/dL — ABNORMAL HIGH (ref 70–99)
Glucose-Capillary: 198 mg/dL — ABNORMAL HIGH (ref 70–99)

## 2018-01-24 MED ORDER — MAGNESIUM SULFATE IN D5W 1-5 GM/100ML-% IV SOLN
1.0000 g | Freq: Once | INTRAVENOUS | Status: AC
  Administered 2018-01-24: 1 g via INTRAVENOUS
  Filled 2018-01-24: qty 100

## 2018-01-24 MED ORDER — MAGNESIUM SULFATE 50 % IJ SOLN: 1.0000 g | Freq: Once | INTRAMUSCULAR | Status: DC

## 2018-01-24 MED ORDER — INSULIN ASPART 100 UNIT/ML ~~LOC~~ SOLN
3.0000 [IU] | Freq: Three times a day (TID) | SUBCUTANEOUS | Status: DC
  Administered 2018-01-24 (×2): 3 [IU] via SUBCUTANEOUS

## 2018-01-24 MED ORDER — FUROSEMIDE 40 MG PO TABS: 40.0000 mg | ORAL_TABLET | Freq: Two times a day (BID) | ORAL | 2 refills | Status: DC

## 2018-01-24 MED ORDER — SULFAMETHOXAZOLE-TRIMETHOPRIM 800-160 MG PO TABS: 2.0000 | ORAL_TABLET | Freq: Two times a day (BID) | ORAL | 0 refills | Status: AC

## 2018-01-24 MED ORDER — POLYETHYLENE GLYCOL 3350 17 G PO PACK: 17.0000 g | PACK | Freq: Every day | ORAL | 0 refills | Status: DC

## 2018-01-24 NOTE — Progress Notes (Addendum)
   Subjective:   Today, Terry Parks was examined at bedside and always in a good mood. He sat on the bedside commode for about 2 hours yesterday and still was not able to have a bowel movement. Otherwise, he is doing well, ambulating, tolerating his diet and passing gas.   I followed up with in later and reported that he had a bowel movement.   Objective:  Vital signs in last 24 hours: Vitals:   01/23/18 0504 01/23/18 1407 01/23/18 2140 01/24/18 0444  BP: (!) 141/88 123/76 132/89 (!) 142/89  Pulse: 89 82 89 79  Resp: 16 18 19 19   Temp: 97.7 F (36.5 C) 98 F (36.7 C) 98.2 F (36.8 C) 98.1 F (36.7 C)  TempSrc: Oral Oral Oral Oral  SpO2: 100% 100% 98% 96%  Weight:      Height:       Const: In NAD, lying on bed CV: RRR, no MGR Resp: CTABL, no wheezes, crackles, rhonchi Abdomen: BS+, non tender, distended secondary to body habitus, JP in RLQ with serous fluid Ext: 1+ non pitting edema  Assessment/Plan:  Active Problems:   SBO (small bowel obstruction) (HCC)   Pelvic abscess in male [facility])   Intra-abdominal abscess (HCC)  TerryBellamyis a 62 year old African-American gentleman with type 2 diabetes mellitus, hypertension,HFrEF EF 35-40% in 2019,prostate cancers/probotic assisted laparoscopic radical prostatectomy05/2019currently being managed for small bowel obstructionandpelvic abscess- HD11  Small bowel obstruction - Resolved Pelvic abscess - improved CT Abdomen and pelvis revealed improvement to pelvic abscesses after pigtail catheter placement, slightly enlarged small bowel loops without point of obstruction identified. Remains afebrile, tolerating diet, passing gas and had bowel movement this morning. -Continue Bactrim 800-160mg  BID for 10 days (Last day 01/30/18) -IR following but will f/u for pig tail catheter instructions and follow up once patient is discharged  AoCKD: Stable. 1.52<<1.51  HTN: ContinueCoreg 12.5mg  BID, Lasix 40mg  BID, Losartan, Amlodipine  5mg   HFrEF:ContinueCoreg 12.5mg  BID, lasix 40mg  BID -HoldImdur 30mg  daily -Daily weights (dry weight 132.2 kg)  Hypomagnesemia:improving. 1.6 this AM <<1.5  -Will replete  Dispo: Anticipated discharge once clinically stable  Jean Rosenthal, MD 01/24/2018, 6:49 AM Pager: [phone] IMTS PGY-1

## 2018-01-24 NOTE — Discharge Instructions (Signed)
Mr. Nobel, Brar have an appointment with the [facility] Monday 9/16 at 1:15 pm. If you need to be sooner, please contact the clinic at [phone] to schedule an earlier appointment.     Information on my medicine - XARELTO (Rivaroxaban)  This medication education was reviewed with me or my healthcare representative as part of my discharge preparation.    Why was Xarelto prescribed for you? Xarelto was prescribed for you to reduce the risk of a blood clot forming that can cause a stroke if you have a medical condition called atrial fibrillation (a type of irregular heartbeat).  What do you need to know about xarelto ? Take your Xarelto ONCE DAILY at the same time every day with your evening meal. If you have difficulty swallowing the tablet whole, you may crush it and mix in applesauce just prior to taking your dose.  Take Xarelto exactly as prescribed by your doctor and DO NOT stop taking Xarelto without talking to the doctor who prescribed the medication.  Stopping without other stroke prevention medication to take the place of Xarelto may increase your risk of developing a clot that causes a stroke.  Refill your prescription before you run out.  After discharge, you should have regular check-up appointments with your healthcare provider that is prescribing your Xarelto.  In the future your dose may need to be changed if your kidney function or weight changes by a significant amount.  What do you do if you miss a dose? If you are taking Xarelto ONCE DAILY and you miss a dose, take it as soon as you remember on the same day then continue your regularly scheduled once daily regimen the next day. Do not take two doses of Xarelto at the same time or on the same day.   Important Safety Information A possible side effect of Xarelto is bleeding. You should call your healthcare provider right away if you experience any of the following: ? Bleeding from an injury or  your nose that does not stop. ? Unusual colored urine (red or dark brown) or unusual colored stools (red or black). ? Unusual bruising for unknown reasons. ? A serious fall or if you hit your head (even if there is no bleeding).  Some medicines may interact with Xarelto and might increase your risk of bleeding while on Xarelto. To help avoid this, consult your healthcare provider or pharmacist prior to using any new prescription or non-prescription medications, including herbals, vitamins, non-steroidal anti-inflammatory drugs (NSAIDs) and supplements.  This website has more information on Xarelto: [email or web address]

## 2018-01-24 NOTE — Progress Notes (Signed)
Pt flushed drain byself. Stated that he feels comfortable in doing the care of his drain. Supplies, flushes and recording sheet sent home with pt.[name]

## 2018-01-24 NOTE — Plan of Care (Signed)
  Problem: Health Behavior/Discharge Planning: Goal: Ability to manage health-related needs will improve Outcome: Progressing   Problem: Nutrition: Goal: Adequate nutrition will be maintained Outcome: Progressing   

## 2018-01-24 NOTE — Progress Notes (Signed)
Referring Physician(s): Dr Chinita Pester  Supervising Physician: Corrie Mckusick  Patient Status:  [facility] - In-pt  Chief Complaint:  Pelvic  abscess Drain 8/31  Subjective:  Up in room Better daily CT shows resolution of abscess IMPRESSION: 1. Pelvic abscesses have been drained with pigtail catheter. Mild haziness of fat planes surround appendix, small bowel loops and portions of colon. This represents significant improvement from the prior examination. No new abscess identified. 2. Fluid and gas-filled normal to slightly enlarged small bowel loops without point of obstruction identified. This may represent reaction to the inflammatory process and improved from prior exam. 3. Decompressed urinary bladder with circumferential wall thickening mild haziness of surrounding fat planes may reflect irritation from drained abscess. Underlying cystitis not excluded. 4. Tiny sclerotic foci proximal right femur, right acetabulum, left pubic body and left sacrum may represent bone islands although small sclerotic metastatic foci not excluded given the patient's history of prostate cancer.  Reviewed imaging with Dr Earleen Newport  Allergies: Lisinopril  Medications: Prior to Admission medications   Medication Sig Start Date End Date Taking? Authorizing Provider  acetaminophen (TYLENOL) 325 MG tablet Take 325-650 mg by mouth every 6 (six) hours as needed (for pain).   Yes [provider]  atorvastatin (LIPITOR) 40 MG tablet Take 40 mg by mouth daily. 10/14/17  Yes [provider]  carvedilol (COREG) 12.5 MG tablet Take 1 tablet (12.5 mg total) by mouth 2 (two) times daily with a meal. 10/14/17 01/13/18 Yes [name], Vahini, MD  DULoxetine (CYMBALTA) 60 MG capsule Take 1 capsule (60 mg total) by mouth daily. 10/14/17 10/14/18 Yes [name], Vahini, MD  furosemide (LASIX) 40 MG tablet Take 40 mg by mouth 2 (two) times daily. 01/03/18  Yes [provider]  Insulin Glargine (LANTUS  SOLOSTAR) 100 UNIT/ML Solostar Pen Inject 60 Units into the skin daily before breakfast.   Yes [provider]  isosorbide mononitrate (IMDUR) 30 MG 24 hr tablet Take 1 tablet (30 mg total) by mouth daily. 07/06/17  Yes Forde Dandy, PharmD  metFORMIN (GLUCOPHAGE-XR) 500 MG 24 hr tablet Take 2 tablets (1,000 mg total) by mouth 2 (two) times daily with a meal. 01/13/18  Yes Forde Dandy, PharmD  nystatin cream (MYCOSTATIN) Apply 1 application topically See admin instructions. Apply to arms and legs 2 times a day   Yes [provider]  rivaroxaban (XARELTO) 20 MG TABS tablet Take 20 mg by mouth daily with breakfast.   Yes [provider]  amiodarone (PACERONE) 200 MG tablet Take 1 tablet (200 mg total) by mouth daily. 10/14/17 01/13/18  [name], [name], MD  amLODipine-atorvastatin (CADUET) 5-40 MG tablet Take 1 tablet by mouth daily. 10/14/17   [name], [name], MD  gabapentin (NEURONTIN) 300 MG capsule Take 1 capsule (300 mg total) by mouth daily. 10/14/17 01/13/18  [name], [name], MD  glucose blood (TRUETRACK TEST) test strip Use as directed to test blood sugar three times a day before meals. 10/04/12   Bertha Stakes, MD  Insulin Glargine (BASAGLAR KWIKPEN) 100 UNIT/ML SOPN Inject 0.6 mLs (60 Units total) into the skin daily. Patient not taking: Reported on 01/13/2018 01/11/18   [name], MD  Insulin Pen Needle (CARETOUCH PEN NEEDLES) 31G X 6 MM MISC 1 pen by Does not apply route at bedtime. 03/01/17   [name], MD  Lancets MISC Use to test blood sugar three times a day before meals. 09/14/13   Malena Catholic, MD  losartan (COZAAR) 50 MG tablet Take 1  tablet (50 mg total) by mouth daily. 10/14/17   [name], MD  Misc. Devices MISC by Does not apply route. C-PAP    [provider]     Vital Signs: BP (!) 142/89 (BP Location: Right Arm)   Pulse 79   Temp 98.1 F (36.7 C) (Oral)   Resp 19   Ht 6\' 1"  (1.854 m)   Wt (!) 304 lb 0.2 oz (137.9 kg)    SpO2 96%   BMI 40.11 kg/m   Physical Exam  Skin: Skin is warm and dry.  Site is clean and dry NT no bleeding OP minimal +MRSA   Vitals reviewed.   Imaging: Ct Abdomen Pelvis Wo Contrast  Result Date: 01/23/2018 CLINICAL DATA:  62 year old male with pelvic abscesses percutaneously drained. Prostatectomy 10/06/2017. Passing gas. No bowel movement. Afebrile. Tolerating p.o. diet. Ambulating. Subsequent encounter. EXAM: CT ABDOMEN AND PELVIS WITHOUT CONTRAST TECHNIQUE: Multidetector CT imaging of the abdomen and pelvis was performed following the standard protocol without IV contrast. COMPARISON:  01/14/2018 CTs. FINDINGS: Lower chest: Minimal basilar atelectasis/scarring. Mild cardiomegaly. Hepatobiliary: Taking into account limitation by non contrast imaging, no worrisome hepatic lesion. No calcified gallstone. Pancreas: Taking into account limitation by non contrast imaging, no worrisome pancreatic mass or inflammation. Spleen: Small splenules noted. Taking into account limitation by non contrast imaging, no splenic mass or enlargement. Adrenals/Urinary Tract: No adrenal lesion. No obstructing stone or hydronephrosis. Taking into account limitation by non contrast imaging, no worrisome renal lesion. Contracting urinary bladder with circumferential wall thickening. Stomach/Bowel: Pelvic abscesses have been drained with pigtail catheter. Mild haziness of fat planes surround appendix, small bowel loops and portions of colon. This represents significant improvement from the prior examination. No new abscess identified. Fluid and gas-filled normal to slightly enlarged small bowel loops without point of obstruction identified. This may represent reaction to the inflammatory process and improved from prior exam. No bowel obstructing lesion identified. Vascular/Lymphatic: Trace calcification common iliac arteries. No abdominal aortic aneurysm. Scattered small lymph nodes possibly reactive in origin.  Reproductive: Post prostatectomy. Other: Diastases rectus muscles.  Mild third spacing of fluid. Musculoskeletal: Tiny sclerotic foci proximal right femur, right acetabulum, left sacrum and left pubic body possibly bone islands. Small sclerotic metastatic lesions cannot be excluded. IMPRESSION: 1. Pelvic abscesses have been drained with pigtail catheter. Mild haziness of fat planes surround appendix, small bowel loops and portions of colon. This represents significant improvement from the prior examination. No new abscess identified. 2. Fluid and gas-filled normal to slightly enlarged small bowel loops without point of obstruction identified. This may represent reaction to the inflammatory process and improved from prior exam. 3. Decompressed urinary bladder with circumferential wall thickening mild haziness of surrounding fat planes may reflect irritation from drained abscess. Underlying cystitis not excluded. 4. Tiny sclerotic foci proximal right femur, right acetabulum, left pubic body and left sacrum may represent bone islands although small sclerotic metastatic foci not excluded given the patient's history of prostate cancer. Electronically Signed   By: Genia Del M.D.   On: 01/23/2018 12:53    Labs:  CBC: Recent Labs    01/16/18 0450 01/17/18 0532 01/18/18 0607 01/19/18 0526  WBC 21.1* 17.5* 14.0* 10.4  HGB 10.8* 10.2* 10.3* 9.3*  HCT 34.8* 32.7* 33.8* 30.9*  PLT 372 380 349 334    COAGS: Recent Labs    06/10/17 1414 06/11/17 0422 06/11/17 1600 09/26/17 1149  INR 2.41 2.14 1.86 1.09    BMP: Recent Labs    01/20/18 0427  01/21/18 0556 01/23/18 0725 01/24/18 0538  NA 137 135 135 132*  K 3.5 4.1 5.0 4.6  CL 100 102 106 102  CO2 27 26 20* 23  GLUCOSE 162* 141* 186* 194*  BUN 6 5* 10 11  CALCIUM 7.6* 7.5* 7.6* 8.0*  CREATININE 1.47* 1.43* 1.51* 1.52*  GFRNONAA 50* 52* 49* 48*  GFRAA 58* >60 56* 56*    LIVER FUNCTION TESTS: Recent Labs    08/22/17 1151  01/08/18 0934 01/13/18 1640 01/21/18 0556  BILITOT 0.3 1.7* 1.3* 0.5  AST 18 18 47* 22  ALT 15 16 25 14   ALKPHOS 84 91 105 47  PROT 6.7 7.3 6.8 6.5  ALBUMIN 3.4* 3.0* 2.3* 1.8*    Assessment and Plan:  Home with drain per MD Discussed with Dr Vernard Gambles and Dr Earleen Newport Plan for follow up in IR drain clinic as OP 1 week Orders in chart Flush daily 5-10 cc daily-- Home health or pt education Rx for flushes  Pt will hear from clinic scheduler for time and date  Electronically Signed: Dhanush Jokerst A, PA-C 01/24/2018, 2:30 PM   I spent a total of 25 Minutes at the the patient's bedside AND on the patient's hospital floor or unit, greater than 50% of which was counseling/coordinating care for pelvic abscess

## 2018-01-25 ENCOUNTER — Other Ambulatory Visit: Payer: Self-pay | Admitting: Internal Medicine

## 2018-01-25 DIAGNOSIS — K651 Peritoneal abscess: Secondary | ICD-10-CM

## 2018-01-30 ENCOUNTER — Ambulatory Visit: Payer: Self-pay

## 2018-01-30 ENCOUNTER — Encounter: Payer: Self-pay | Admitting: Internal Medicine

## 2018-02-01 ENCOUNTER — Ambulatory Visit: Payer: MEDICAID | Admitting: Neurology

## 2018-02-02 ENCOUNTER — Encounter: Payer: Self-pay | Admitting: Neurology

## 2018-02-02 ENCOUNTER — Ambulatory Visit
Admission: RE | Admit: 2018-02-02 | Discharge: 2018-02-02 | Disposition: A | Discharge status: Home / Self Care | Payer: Self-pay | Source: Ambulatory Visit | Attending: Internal Medicine | Admitting: Internal Medicine

## 2018-02-02 ENCOUNTER — Ambulatory Visit
Admission: RE | Admit: 2018-02-02 | Discharge: 2018-02-02 | Disposition: A | Discharge status: Home / Self Care | Payer: Self-pay | Source: Ambulatory Visit | Attending: Radiology | Admitting: Radiology

## 2018-02-02 ENCOUNTER — Encounter: Payer: Self-pay | Admitting: Radiology

## 2018-02-02 DIAGNOSIS — K651 Peritoneal abscess: Secondary | ICD-10-CM | POA: Diagnosis not present

## 2018-02-02 HISTORY — PX: IR RADIOLOGIST EVAL & MGMT: IMG5224

## 2018-02-02 MED ORDER — IOPAMIDOL (ISOVUE-300) INJECTION 61%
125.0000 mL | Freq: Once | INTRAVENOUS | Status: AC | PRN
  Administered 2018-02-02: 125 mL via INTRAVENOUS

## 2018-02-02 NOTE — Progress Notes (Signed)
Chief Complaint: Follow-up pelvic abscess drain  Supervising Physician: Markus Daft  History of Present Illness: Terry Parks is a 62 y.o. male with past medical history significant for a.fib on Plavix, BPH s/p TURP 07/13/17, prostate cancer s/p radical prostatectomy with lymphadenectomy 10/06/17, CHF, DM II, HLD and HTN originally presented to [facility] ED on 01/08/18 with complaints of cough, chills and left sided numbness. Stroke work up was negative however he was found to be febrile and lab work showed leukocytosis of unclear etiology - he was admitted for further evaluation. He subsequently developed abdominal pain after admission and CT abdomen/pelvis was performed on 01/14/18 which showed pelvic absces of indeterminate etiology with surrounding inflammatory changes within the pelvis as well small bowel obstruction. General surgery was consulted who recommended to continue IV antibiotics and consult was placed to IR for percutaneous drain placement which was performed on 01/14/18 by Dr. Annamaria Boots.   Follow-up CT abdomen/pelvis was performed on 01/24/18 prior to discharge which showed pelvic abscess had improved greatly after drain placement. He was discharged to home with instructions for flushing and recording drain output - he presents to IR clinic today for repeat CT scan and possible drain injection.  Patient reports that he "cut my tube" (indicating JP bulb) inadvertently a few days ago for which he placed tape over JP bulb, he denies leakage from the bulb. He states he stopped flushing on 9/16 because there was no output that day so he "figured it was all dried up." He states he is supposed to be on four antibiotics currently however he reports confusion regarding how to take the antibiotics and that when he went to pick them up the pharmacy only had one in stock so he never went back, he has been taking one antibiotic only but cannot remember the name. He denies any fever, chills, abdominal  pain, nausea, vomiting, dysuria, hematuria or changes in bowel habits.  Past Medical History:  Diagnosis Date  . Abscessed tooth    top back large cavity no pain or drainage, one on bottom  pt pulled tooth 4-5 months ago, right top large hole in tooth  . AKI (acute kidney injury) ([place])   . Allergic rhinitis   . Atrial fibrillation ([place])   . BPH (benign prostatic hypertrophy)    Massive BPH noted on cystoscopy 1/23/ 2012 by Dr. Risa Grill.  . Cardiomyopathy ([place])   . CHF (congestive heart failure) ([place])   . Cough 03/30/2012  . Depression   . Diabetes mellitus 04/08/2008   type 2  . Foley catheter in place 07-05-17 placed  . Headache(784.0)    hx migraines none recent  . Hyperlipemia   . Hypertension   . Hypertensive cardiopathy 03/01/2006   2-D echocardiogram 02/01/2012 showed moderate LVH, mildly to moderately reduced left ventricular systolic function with an estimated ejection fraction of 40-45%, and diffuse hypokinesis.  A nuclear medicine stress study done 01/31/2012 showed no reversible ischemia, a small mid anterior wall fixed defect/infarct, and ejection fraction 42%.      . Neck pain   . Obstructive sleep apnea 03/06/2008   Sleep study 03/06/08 showed severe OSA/hypopnea syndrome, with successful CPAP titration to 13 CWP using a medium ResMed Mirage Quattro full face mask with heated humidifier.   . Rash 04/17/2014  . Renal calculus 05/29/2010   CT scan of abdomen/pelvis on 05/29/2010 showed an obstructing approximate 1-2 mm calculus at the left UVJ, and an approximate 1-2 mm left lower pole renal calculus.   Patient  had continuing severe pain , and an elevation of his serum creatinine to a value of 1.75 on 06/06/2010.  The stone had apparently passed and was not seen on repeat CT 06/08/2010.  [name] Tooth pain 10/29/2017  . Urinary straining 11/02/2016    Past Surgical History:  Procedure Laterality Date  . big toe nails removed Bilateral 20 yrs ago  . CARDIOVERSION N/A 06/08/2017    Procedure: CARDIOVERSION;  Surgeon: Josue Hector, MD;  Location: [facility] ENDOSCOPY;  Service: Cardiovascular;  Laterality: N/A;  . CYSTOSCOPY W/ RETROGRADES    . LYMPHADENECTOMY Bilateral 10/06/2017   Procedure: LYMPHADENECTOMY;  Surgeon: Lucas Mallow, MD;  Location: [facility] ORS;  Service: Urology;  Laterality: Bilateral;  . ROBOT ASSISTED LAPAROSCOPIC RADICAL PROSTATECTOMY N/A 10/06/2017   Procedure: XI ROBOTIC ASSISTED LAPAROSCOPIC RADICAL PROSTATECTOMY;  Surgeon: Lucas Mallow, MD;  Location: [facility] ORS;  Service: Urology;  Laterality: N/A;  . TRANSURETHRAL RESECTION OF BLADDER TUMOR N/A 07/13/2017   Procedure: TRANSURETHRAL RESECTION OF PROSTATE;  Surgeon: Lucas Mallow, MD;  Location: [facility] ORS;  Service: Urology;  Laterality: N/A;    Allergies: Lisinopril  Medications: Prior to Admission medications   Medication Sig Start Date End Date Taking? Authorizing Provider  acetaminophen (TYLENOL) 325 MG tablet Take 325-650 mg by mouth every 6 (six) hours as needed (for pain).    [provider]  amiodarone (PACERONE) 200 MG tablet Take 1 tablet (200 mg total) by mouth daily. 10/14/17 01/13/18  [name], [name], MD  amLODipine-atorvastatin (CADUET) 5-40 MG tablet Take 1 tablet by mouth daily. 10/14/17   [name], [name], MD  atorvastatin (LIPITOR) 40 MG tablet Take 40 mg by mouth daily. 10/14/17   [provider]  carvedilol (COREG) 12.5 MG tablet Take 1 tablet (12.5 mg total) by mouth 2 (two) times daily with a meal. 10/14/17 01/13/18  [name], [name], MD  DULoxetine (CYMBALTA) 60 MG capsule Take 1 capsule (60 mg total) by mouth daily. 10/14/17 10/14/18  [name], MD  furosemide (LASIX) 40 MG tablet Take 1 tablet (40 mg total) by mouth 2 (two) times daily. 01/24/18   Jean Rosenthal, MD  gabapentin (NEURONTIN) 300 MG capsule Take 1 capsule (300 mg total) by mouth daily. 10/14/17 01/13/18  [name], [name], MD  glucose blood (TRUETRACK TEST) test strip Use as directed to test blood sugar  three times a day before meals. 10/04/12   Bertha Stakes, MD  Insulin Glargine (BASAGLAR KWIKPEN) 100 UNIT/ML SOPN Inject 0.6 mLs (60 Units total) into the skin daily. Patient not taking: Reported on 01/13/2018 01/11/18   [name], MD  Insulin Glargine (LANTUS SOLOSTAR) 100 UNIT/ML Solostar Pen Inject 60 Units into the skin daily before breakfast.    [provider]  Insulin Pen Needle (CARETOUCH PEN NEEDLES) 31G X 6 MM MISC 1 pen by Does not apply route at bedtime. 03/01/17   [name], [name], MD  isosorbide mononitrate (IMDUR) 30 MG 24 hr tablet Take 1 tablet (30 mg total) by mouth daily. 07/06/17   Forde Dandy, PharmD  Lancets MISC Use to test blood sugar three times a day before meals. 09/14/13   Malena Catholic, MD  losartan (COZAAR) 50 MG tablet Take 1 tablet (50 mg total) by mouth daily. 10/14/17   [name], MD  metFORMIN (GLUCOPHAGE-XR) 500 MG 24 hr tablet Take 2 tablets (1,000 mg total) by mouth 2 (two) times daily with a meal. 01/13/18   Forde Dandy, PharmD  Misc. Devices MISC by Does not  apply route. C-PAP    [provider]  nystatin cream (MYCOSTATIN) Apply 1 application topically See admin instructions. Apply to arms and legs 2 times a day    [provider]  polyethylene glycol (MIRALAX / GLYCOLAX) packet Take 17 g by mouth daily. 01/25/18   Jean Rosenthal, MD  rivaroxaban (XARELTO) 20 MG TABS tablet Take 20 mg by mouth daily with breakfast.    [provider]     Family History  Problem Relation Age of Onset  . Breast cancer Mother   . Hypertension Father   . Diabetes Maternal Grandmother   . Colon cancer Neg Hx   . Prostate cancer Neg Hx   . Heart attack Neg Hx     Social History   Socioeconomic History  . Marital status: Single    Spouse name: Not on file  . Number of children: 4 b  . Years of education: 34  . Highest education level: Not on file  Occupational History  . Occupation:        Employer: UNEMPLOYED    Social Needs  . Financial resource strain: Not on file  . Food insecurity:    Worry: Not on file    Inability: Not on file  . Transportation needs:    Medical: Not on file    Non-medical: Not on file  Tobacco Use  . Smoking status: Never Smoker  . Smokeless tobacco: Never Used  Substance and Sexual Activity  . Alcohol use: No    Alcohol/week: 0.0 standard drinks  . Drug use: No  . Sexual activity: Not Currently  Lifestyle  . Physical activity:    Days per week: Not on file    Minutes per session: Not on file  . Stress: Not on file  Relationships  . Social connections:    Talks on phone: Not on file    Gets together: Not on file    Attends religious service: Not on file    Active member of club or organization: Not on file    Attends meetings of clubs or organizations: Not on file    Relationship status: Not on file  Other Topics Concern  . Not on file  Social History Narrative   Divorced, 4 children, lives alone.       Review of Systems: A 12 point ROS discussed and pertinent positives are indicated in the HPI above.  All other systems are negative.  Review of Systems  Constitutional: Positive for appetite change (decreased but improving) and fatigue. Negative for chills and fever.  Respiratory: Negative for cough and shortness of breath.   Cardiovascular: Positive for leg swelling. Negative for chest pain.  Gastrointestinal: Negative for abdominal distention, abdominal pain, constipation, diarrhea, nausea and vomiting.  Genitourinary: Negative for dysuria and hematuria.  Skin: Negative for rash.  Neurological: Negative for dizziness.    Vital Signs: BP (!) 180/118   Pulse 79   Temp 98.2 F (36.8 C)   SpO2 (!) 89%   Physical Exam  Constitutional: He is oriented to person, place, and time. No distress.  HENT:  Head: Normocephalic.  Cardiovascular: Normal rate and regular rhythm.  Pulmonary/Chest: Effort normal.  Abdominal: Soft. He exhibits no distension.  There is no tenderness.  Left pelvic drain in place; no output noted in JP tubing. Drain injection performed today which showed no apparent fistulous connection and resolution of previous abscess.  Neurological: He is alert and oriented to person, place, and time.  Skin: Skin is  warm and dry. He is not diaphoretic.    Imaging: Ct Abdomen Pelvis Wo Contrast  Result Date: 01/23/2018 CLINICAL DATA:  62 year old male with pelvic abscesses percutaneously drained. Prostatectomy 10/06/2017. Passing gas. No bowel movement. Afebrile. Tolerating p.o. diet. Ambulating. Subsequent encounter. EXAM: CT ABDOMEN AND PELVIS WITHOUT CONTRAST TECHNIQUE: Multidetector CT imaging of the abdomen and pelvis was performed following the standard protocol without IV contrast. COMPARISON:  01/14/2018 CTs. FINDINGS: Lower chest: Minimal basilar atelectasis/scarring. Mild cardiomegaly. Hepatobiliary: Taking into account limitation by non contrast imaging, no worrisome hepatic lesion. No calcified gallstone. Pancreas: Taking into account limitation by non contrast imaging, no worrisome pancreatic mass or inflammation. Spleen: Small splenules noted. Taking into account limitation by non contrast imaging, no splenic mass or enlargement. Adrenals/Urinary Tract: No adrenal lesion. No obstructing stone or hydronephrosis. Taking into account limitation by non contrast imaging, no worrisome renal lesion. Contracting urinary bladder with circumferential wall thickening. Stomach/Bowel: Pelvic abscesses have been drained with pigtail catheter. Mild haziness of fat planes surround appendix, small bowel loops and portions of colon. This represents significant improvement from the prior examination. No new abscess identified. Fluid and gas-filled normal to slightly enlarged small bowel loops without point of obstruction identified. This may represent reaction to the inflammatory process and improved from prior exam. No bowel obstructing lesion  identified. Vascular/Lymphatic: Trace calcification common iliac arteries. No abdominal aortic aneurysm. Scattered small lymph nodes possibly reactive in origin. Reproductive: Post prostatectomy. Other: Diastases rectus muscles.  Mild third spacing of fluid. Musculoskeletal: Tiny sclerotic foci proximal right femur, right acetabulum, left sacrum and left pubic body possibly bone islands. Small sclerotic metastatic lesions cannot be excluded. IMPRESSION: 1. Pelvic abscesses have been drained with pigtail catheter. Mild haziness of fat planes surround appendix, small bowel loops and portions of colon. This represents significant improvement from the prior examination. No new abscess identified. 2. Fluid and gas-filled normal to slightly enlarged small bowel loops without point of obstruction identified. This may represent reaction to the inflammatory process and improved from prior exam. 3. Decompressed urinary bladder with circumferential wall thickening mild haziness of surrounding fat planes may reflect irritation from drained abscess. Underlying cystitis not excluded. 4. Tiny sclerotic foci proximal right femur, right acetabulum, left pubic body and left sacrum may represent bone islands although small sclerotic metastatic foci not excluded given the patient's history of prostate cancer. Electronically Signed   By: Genia Del M.D.   On: 01/23/2018 12:53   Ct Angio Head W Or Wo Contrast  Result Date: 01/09/2018 CLINICAL DATA:  Left-sided numbness EXAM: CT ANGIOGRAPHY HEAD AND NECK TECHNIQUE: Multidetector CT imaging of the head and neck was performed using the standard protocol during bolus administration of intravenous contrast. Multiplanar CT image reconstructions and MIPs were obtained to evaluate the vascular anatomy. Carotid stenosis measurements (when applicable) are obtained utilizing NASCET criteria, using the distal internal carotid diameter as the denominator. CONTRAST:  78mL ISOVUE-370 IOPAMIDOL  (ISOVUE-370) INJECTION 76% COMPARISON:  Head CT 01/08/2018 FINDINGS: CT HEAD FINDINGS Brain: There is no mass, hemorrhage or extra-axial collection. The size and configuration of the ventricles and extra-axial CSF spaces are normal. There is no acute or chronic infarction. The brain parenchyma is normal. Skull: The visualized skull base, calvarium and extracranial soft tissues are normal. Sinuses/Orbits: No fluid levels or advanced mucosal thickening of the visualized paranasal sinuses. No mastoid or middle ear effusion. The orbits are normal. CTA NECK FINDINGS AORTIC ARCH: There is no calcific atherosclerosis of the aortic arch. There is no  aneurysm, dissection or hemodynamically significant stenosis of the visualized ascending aorta and aortic arch. Normal variant aortic arch branching pattern with the brachiocephalic and left common carotid arteries sharing a common origin. The visualized proximal subclavian arteries are widely patent. RIGHT CAROTID SYSTEM: --Common carotid artery: Widely patent origin without common carotid artery dissection or aneurysm. --Internal carotid artery: No dissection, occlusion or aneurysm. No hemodynamically significant stenosis. --External carotid artery: No acute abnormality. LEFT CAROTID SYSTEM: --Common carotid artery: There is noncalcified atherosclerotic plaque in the left common carotid artery without hemodynamically significant stenosis by NASCET criteria. --Internal carotid artery:No dissection, occlusion or aneurysm. No hemodynamically significant stenosis. --External carotid artery: No acute abnormality. VERTEBRAL ARTERIES: Left dominant configuration. Both origins are normal. No dissection, occlusion or flow-limiting stenosis to the vertebrobasilar confluence. SKELETON: There is no bony spinal canal stenosis. No lytic or blastic lesion. OTHER NECK: Normal pharynx, larynx and major salivary glands. No cervical lymphadenopathy. Unremarkable thyroid gland. UPPER CHEST: No  pneumothorax or pleural effusion. No nodules or masses. CTA HEAD FINDINGS ANTERIOR CIRCULATION: --Intracranial internal carotid arteries: Normal. --Anterior cerebral arteries: Normal. Both A1 segments are present. Patent anterior communicating artery. --Middle cerebral arteries: Normal. --Posterior communicating arteries: Present on the left, absent on the right. POSTERIOR CIRCULATION: --Basilar artery: Normal. --Posterior cerebral arteries: Normal. --Superior cerebellar arteries: Normal. --Inferior cerebellar arteries: Normal anterior and posterior inferior cerebellar arteries. VENOUS SINUSES: As permitted by contrast timing, patent. ANATOMIC VARIANTS: None DELAYED PHASE: No parenchymal contrast enhancement. Review of the MIP images confirms the above findings. IMPRESSION: 1. No emergent large vessel occlusion or hemodynamically significant stenosis. 2. No acute abnormality of the head or neck. 3. Atherosclerotic plaque in the left common carotid artery without significant stenosis. Electronically Signed   By: Ulyses Jarred M.D.   On: 01/09/2018 17:22   Dg Chest 2 View  Result Date: 01/08/2018 CLINICAL DATA:  Fever, left-sided numbness EXAM: CHEST - 2 VIEW COMPARISON:  06/06/2017 chest radiograph. FINDINGS: Stable cardiomediastinal silhouette with mild cardiomegaly. No pneumothorax. No pleural effusion. Lungs appear clear, with no acute consolidative airspace disease and no pulmonary edema. IMPRESSION: Stable mild cardiomegaly without overt pulmonary edema. No active pulmonary disease. Electronically Signed   By: Ilona Sorrel M.D.   On: 01/08/2018 10:00   Dg Abd 1 View  Result Date: 01/13/2018 CLINICAL DATA:  SBO  Image sitting upright per MD order EXAM: ABDOMEN - 1 VIEW COMPARISON:  CT of the abdomen and pelvis on 06/07/2010 FINDINGS: Single view of the abdomen demonstrates normal bowel gas pattern. There is no evidence for free intraperitoneal air. The pelvis is not imaged. IMPRESSION: No evidence for  acute  abnormality. Electronically Signed   By: Nolon Nations M.D.   On: 01/13/2018 20:39   Ct Head Wo Contrast  Result Date: 01/08/2018 CLINICAL DATA:  Left-sided numbness beginning last night. EXAM: CT HEAD WITHOUT CONTRAST TECHNIQUE: Contiguous axial images were obtained from the base of the skull through the vertex without intravenous contrast. COMPARISON:  11/08/2017.  02/21/2007. FINDINGS: Brain: The brain shows a normal appearance without evidence of malformation, atrophy, old or acute small or large vessel infarction, mass lesion, hemorrhage, hydrocephalus or extra-axial collection. Vascular: No hyperdense vessel. No evidence of atherosclerotic calcification. Skull: Normal.  No traumatic finding.  No focal bone lesion. Sinuses/Orbits: Sinuses are clear. Orbits appear normal. Mastoids are clear. Other: None significant IMPRESSION: Normal head CT Electronically Signed   By: Nelson Chimes M.D.   On: 01/08/2018 11:21   Ct Angio Neck W Or Wo Contrast  Result Date: 01/09/2018 CLINICAL DATA:  Left-sided numbness EXAM: CT ANGIOGRAPHY HEAD AND NECK TECHNIQUE: Multidetector CT imaging of the head and neck was performed using the standard protocol during bolus administration of intravenous contrast. Multiplanar CT image reconstructions and MIPs were obtained to evaluate the vascular anatomy. Carotid stenosis measurements (when applicable) are obtained utilizing NASCET criteria, using the distal internal carotid diameter as the denominator. CONTRAST:  72mL ISOVUE-370 IOPAMIDOL (ISOVUE-370) INJECTION 76% COMPARISON:  Head CT 01/08/2018 FINDINGS: CT HEAD FINDINGS Brain: There is no mass, hemorrhage or extra-axial collection. The size and configuration of the ventricles and extra-axial CSF spaces are normal. There is no acute or chronic infarction. The brain parenchyma is normal. Skull: The visualized skull base, calvarium and extracranial soft tissues are normal. Sinuses/Orbits: No fluid levels or advanced  mucosal thickening of the visualized paranasal sinuses. No mastoid or middle ear effusion. The orbits are normal. CTA NECK FINDINGS AORTIC ARCH: There is no calcific atherosclerosis of the aortic arch. There is no aneurysm, dissection or hemodynamically significant stenosis of the visualized ascending aorta and aortic arch. Normal variant aortic arch branching pattern with the brachiocephalic and left common carotid arteries sharing a common origin. The visualized proximal subclavian arteries are widely patent. RIGHT CAROTID SYSTEM: --Common carotid artery: Widely patent origin without common carotid artery dissection or aneurysm. --Internal carotid artery: No dissection, occlusion or aneurysm. No hemodynamically significant stenosis. --External carotid artery: No acute abnormality. LEFT CAROTID SYSTEM: --Common carotid artery: There is noncalcified atherosclerotic plaque in the left common carotid artery without hemodynamically significant stenosis by NASCET criteria. --Internal carotid artery:No dissection, occlusion or aneurysm. No hemodynamically significant stenosis. --External carotid artery: No acute abnormality. VERTEBRAL ARTERIES: Left dominant configuration. Both origins are normal. No dissection, occlusion or flow-limiting stenosis to the vertebrobasilar confluence. SKELETON: There is no bony spinal canal stenosis. No lytic or blastic lesion. OTHER NECK: Normal pharynx, larynx and major salivary glands. No cervical lymphadenopathy. Unremarkable thyroid gland. UPPER CHEST: No pneumothorax or pleural effusion. No nodules or masses. CTA HEAD FINDINGS ANTERIOR CIRCULATION: --Intracranial internal carotid arteries: Normal. --Anterior cerebral arteries: Normal. Both A1 segments are present. Patent anterior communicating artery. --Middle cerebral arteries: Normal. --Posterior communicating arteries: Present on the left, absent on the right. POSTERIOR CIRCULATION: --Basilar artery: Normal. --Posterior cerebral  arteries: Normal. --Superior cerebellar arteries: Normal. --Inferior cerebellar arteries: Normal anterior and posterior inferior cerebellar arteries. VENOUS SINUSES: As permitted by contrast timing, patent. ANATOMIC VARIANTS: None DELAYED PHASE: No parenchymal contrast enhancement. Review of the MIP images confirms the above findings. IMPRESSION: 1. No emergent large vessel occlusion or hemodynamically significant stenosis. 2. No acute abnormality of the head or neck. 3. Atherosclerotic plaque in the left common carotid artery without significant stenosis. Electronically Signed   By: Ulyses Jarred M.D.   On: 01/09/2018 17:22   Mr Brain Wo Contrast  Result Date: 01/08/2018 CLINICAL DATA:  62 y/o M; left upper and lower extremity numbness constant since yesterday morning. EXAM: MRI HEAD WITHOUT CONTRAST TECHNIQUE: Multiplanar, multiecho pulse sequences of the brain and surrounding structures were obtained without intravenous contrast. COMPARISON:  01/08/2018 CT head. FINDINGS: Brain: No acute infarction, hemorrhage, hydrocephalus, extra-axial collection or mass lesion. Few nonspecific T2 FLAIR hyperintensities in predominantly in bilateral parietal periventricular white matter are compatible with mild chronic microvascular ischemic changes for age. Mild volume loss of the brain. Symmetric nonspecific globus pallidus increased T2 and decreased T1 signal (series 10, image 12 and series 14, image 26). No associated reduced diffusion. Vascular: Normal flow voids. Skull and  upper cervical spine: Normal marrow signal. Sinuses/Orbits: Negative. Other: None. IMPRESSION: 1. No acute intracranial abnormality identified. 2. Symmetric nonspecific globus pallidus lesions with increased T2 and decreased T1 signal. Some differential considerations include chronic sequelae of hypoxic ischemic injury, carbon monoxide poisoning, or toxic encephalopathy (notably heroin, MDMA). 3. Mild chronic microvascular ischemic changes and  volume loss of the brain. Electronically Signed   By: Kristine Garbe M.D.   On: 01/08/2018 23:34   Ct Abdomen Pelvis W Contrast  Result Date: 01/14/2018 CLINICAL DATA:  62 year old male with abdominal pain and vomiting. History of prostate cancer. Abdominal distension. Concern for obstruction. EXAM: CT ABDOMEN AND PELVIS WITH CONTRAST TECHNIQUE: Multidetector CT imaging of the abdomen and pelvis was performed using the standard protocol following bolus administration of intravenous contrast. CONTRAST:  47mL OMNIPAQUE IOHEXOL 300 MG/ML  SOLN COMPARISON:  CT of the abdomen pelvis dated 06/07/2010 FINDINGS: Lower chest: The visualized lung bases are clear. There is mild cardiomegaly. No intra-abdominal free air.  Small perihepatic free fluid. Hepatobiliary: No focal liver abnormality is seen. No gallstones, gallbladder wall thickening, or biliary dilatation. Pancreas: Unremarkable. No pancreatic ductal dilatation or surrounding inflammatory changes. Spleen: Normal in size without focal abnormality. Adrenals/Urinary Tract: The adrenal glands, kidneys, and the visualized ureters appear unremarkable. Thickened appearance of the bladder wall likely partly related to underdistention and partly related to inflammatory changes/abscesses within the pelvis. Stomach/Bowel: Mildly dilated fluid-filled loops of small bowel measure up to 5 cm in caliber. There is a focal area of apparent tethering of small bowel loop in the mid abdomen (series 6 image 58 and series 3, image 46) likely representing adhesions. The distal small bowel and the terminal ileum are collapsed. There is thickened appearance of the sigmoid colon likely reactive to inflammatory changes and abscess of the pelvis. The appendix is unremarkable as visualized. Vascular/Lymphatic: Mild aortoiliac atherosclerotic disease. The IVC is unremarkable. No portal venous gas. There is no adenopathy. Reproductive: Prostatectomy.  No pelvic mass. Other: There is  a 12 x 6 x 7 cm loculated fluid collection with organizing walls and surrounding inflammatory changes anterior and superior to the urinary bladder. This collection appears to communicate with a more superior smaller collection which measures 5 x 5 x 3 cm. Findings most consistent with infected fluid collections or abscesses of indeterminate etiology. The possibilities include previously perforated appendicitis. Diverticulitis as the cause of the abscess is less likely as no definite diverticula is identified. Clinical correlation is recommended. Musculoskeletal: There is diastases of anterior abdominal wall musculature at the level of the umbilicus with a small fat containing umbilical hernia. There is stranding of the herniated fat likely reactive to inflammatory changes of the pelvis. No acute osseous pathology. IMPRESSION: 1. Pelvic abscesses of indeterminate etiology with surrounding inflammatory changes within the pelvis. Clinical correlation is recommended. 2. Small bowel obstruction likely secondary to adhesions in the mid abdomen. Electronically Signed   By: Anner Crete M.D.   On: 01/14/2018 03:28   US Renal  Result Date: 01/08/2018 CLINICAL DATA:  Acute kidney injury EXAM: RENAL / URINARY TRACT ULTRASOUND COMPLETE COMPARISON:  None. FINDINGS: Study is limited by patient body habitus. Portions of the RIGHT kidney is not well seen. LEFT kidney is poorly seen. Right Kidney: Length: 10.2 cm. Echogenicity within normal limits. No mass or hydronephrosis visualized. Left Kidney: Length: 9.4 cm. Echogenicity within normal limits. No mass or hydronephrosis visualized. Bladder: Appears normal for degree of bladder distention. IMPRESSION: No abnormality identified. Study limited by patient body habitus.  No hydronephrosis bilaterally. Electronically Signed   By: Franki Cabot M.D.   On: 01/08/2018 23:07   Dg Abd Portable 1v  Result Date: 01/14/2018 CLINICAL DATA:  62 year old male status post placement of  gastric tube EXAM: PORTABLE ABDOMEN - 1 VIEW COMPARISON:  CT scan of the abdomen and pelvis 01/14/2018 FINDINGS: A nasogastric tube is been placed. The tip overlies the expected location of the stomach. Mild bibasilar airspace opacities, likely atelectasis. IMPRESSION: The tip of the gastric tube overlies the expected location of the stomach. Electronically Signed   By: Jacqulynn Cadet M.D.   On: 01/14/2018 13:03   Ct Image Guided Drainage By Percutaneous Catheter  Result Date: 01/14/2018 INDICATION: Pelvic abscess, status post recent prostatectomy EXAM: CT GUIDED DRAINAGE OF ANTERIOR PELVIC ABSCESS ABSCESS MEDICATIONS: The patient is currently admitted to the hospital and receiving intravenous antibiotics. The antibiotics were administered within an appropriate time frame prior to the initiation of the procedure. ANESTHESIA/SEDATION: 1.5 mg IV Versed 100 mcg IV Fentanyl Moderate Sedation Time:  18 minutes The patient was continuously monitored during the procedure by the interventional radiology nurse under my direct supervision. COMPLICATIONS: None immediate. TECHNIQUE: Informed written consent was obtained from the patient after a thorough discussion of the procedural risks, benefits and alternatives. All questions were addressed. Maximal Sterile Barrier Technique was utilized including caps, mask, sterile gowns, sterile gloves, sterile drape, hand hygiene and skin antiseptic. A timeout was performed prior to the initiation of the procedure. PROCEDURE: Previous imaging reviewed. Patient positioned supine. Noncontrast localization CT performed. The anterior pelvic abscess was localized and marked for a right anterior oblique approach. Under sterile conditions and local anesthesia, CT guidance was utilized to advance an 18 gauge 15 cm needle from a right lower quadrant anterior oblique approach into the abscess. Needle position confirmed with CT. Syringe aspiration yielded purulent fluid sent for culture.  Amplatz guidewire inserted followed by tract dilatation to insert a 12 [place] drain. Drain catheter position confirmed with CT. Syringe aspiration yielded 185 cc purulent fluid. Catheter secured with a Prolene suture and connected to external suction bulb. Sterile dressing applied. No immediate complication. Patient tolerated the procedure well. FINDINGS: CT imaging confirms percutaneous needle access of the anterior pelvic abscess for drain insertion IMPRESSION: Successful CT-guided anterior pelvic abscess drain insertion Electronically Signed   By: Jerilynn Mages.  Shick M.D.   On: 01/14/2018 14:14    Labs:  CBC: Recent Labs    01/16/18 0450 01/17/18 0532 01/18/18 0607 01/19/18 0526  WBC 21.1* 17.5* 14.0* 10.4  HGB 10.8* 10.2* 10.3* 9.3*  HCT 34.8* 32.7* 33.8* 30.9*  PLT 372 380 349 334    COAGS: Recent Labs    06/10/17 1414 06/11/17 0422 06/11/17 1600 09/26/17 1149  INR 2.41 2.14 1.86 1.09    BMP: Recent Labs    01/20/18 0427 01/21/18 0556 01/23/18 0725 01/24/18 0538  NA 137 135 135 132*  K 3.5 4.1 5.0 4.6  CL 100 102 106 102  CO2 27 26 20* 23  GLUCOSE 162* 141* 186* 194*  BUN 6 5* 10 11  CALCIUM 7.6* 7.5* 7.6* 8.0*  CREATININE 1.47* 1.43* 1.51* 1.52*  GFRNONAA 50* 52* 49* 48*  GFRAA 58* >60 56* 56*    LIVER FUNCTION TESTS: Recent Labs    08/22/17 1151 01/08/18 0934 01/13/18 1640 01/21/18 0556  BILITOT 0.3 1.7* 1.3* 0.5  AST 18 18 47* 22  ALT 15 16 25 14   ALKPHOS 84 91 105 47  PROT 6.7 7.3 6.8 6.5  ALBUMIN 3.4* 3.0* 2.3* 1.8*    TUMOR MARKERS: No results for input(s): AFPTM, CEA, CA199, CHROMGRNA in the last 8760 hours.  Assessment:  Patient seen in IR clinic today for follow-up of pelvic abscess drain placed 01/14/18 by Dr. Annamaria Boots - CT abdomen/pelvis with drain injection performed in clinic today which showed resolution of fluid collection and no apparent fistulous connections. Imaging reviewed by Dr. Anselm Pancoast today who recommended removal of drain which was  performed successfully by PA at bedside.  Patient encouraged to continue taking antibiotics as prescribed and follow up with PCP as planned. Instructed to keep area where drain was removed clean, dry and covered until healed. Patient stated understanding, all questions answered.  No further follow-up needed in IR at this time.  Electronically Signed:  Joaquim Nam PA-C 02/02/2018, 3:00 PM   Please refer to Dr. Moises Blood attestation of this note for management and plan.

## 2018-02-03 MED FILL — SULFAMETHOXAZOLE-TMP DS TAB: 800-160 | 6 days supply | Qty: 25 | Fill #0

## 2018-02-07 ENCOUNTER — Telehealth: Payer: Self-pay | Admitting: Internal Medicine

## 2018-02-07 NOTE — Telephone Encounter (Addendum)
Returned call to patient. He states legs are in the same condition as when he left the hospital. Patient continues to take lasix 40 mg BID and elevate feet. Denies SHOB. Does not have working scale at home. Has HFU appt tomorrow in [facility] at 0915. Hubbard Hartshorn, RN, BSN

## 2018-02-07 NOTE — Telephone Encounter (Signed)
He should follow up in [facility]. Thank you for letting me know.

## 2018-02-07 NOTE — Telephone Encounter (Signed)
Pt's called about his legs being swollen, cold, and tender to touch x 1 week since being d/c and would like a call back.

## 2018-02-08 ENCOUNTER — Other Ambulatory Visit: Payer: Self-pay

## 2018-02-08 ENCOUNTER — Ambulatory Visit (INDEPENDENT_AMBULATORY_CARE_PROVIDER_SITE_OTHER): Payer: Self-pay | Admitting: Internal Medicine

## 2018-02-08 VITALS — BP 132/83 | HR 78 | Temp 97.5°F | Ht 73.0 in | Wt 292.6 lb

## 2018-02-08 DIAGNOSIS — Z8614 Personal history of Methicillin resistant Staphylococcus aureus infection: Secondary | ICD-10-CM

## 2018-02-08 DIAGNOSIS — K56609 Unspecified intestinal obstruction, unspecified as to partial versus complete obstruction: Secondary | ICD-10-CM

## 2018-02-08 DIAGNOSIS — I5042 Chronic combined systolic (congestive) and diastolic (congestive) heart failure: Secondary | ICD-10-CM

## 2018-02-08 DIAGNOSIS — I10 Essential (primary) hypertension: Secondary | ICD-10-CM

## 2018-02-08 DIAGNOSIS — Z9079 Acquired absence of other genital organ(s): Secondary | ICD-10-CM

## 2018-02-08 DIAGNOSIS — I481 Persistent atrial fibrillation: Secondary | ICD-10-CM

## 2018-02-08 DIAGNOSIS — Z7901 Long term (current) use of anticoagulants: Secondary | ICD-10-CM

## 2018-02-08 DIAGNOSIS — I110 Hypertensive heart disease with heart failure: Secondary | ICD-10-CM

## 2018-02-08 DIAGNOSIS — E1140 Type 2 diabetes mellitus with diabetic neuropathy, unspecified: Secondary | ICD-10-CM

## 2018-02-08 DIAGNOSIS — Z8719 Personal history of other diseases of the digestive system: Secondary | ICD-10-CM

## 2018-02-08 DIAGNOSIS — Z8546 Personal history of malignant neoplasm of prostate: Secondary | ICD-10-CM

## 2018-02-08 DIAGNOSIS — I4819 Other persistent atrial fibrillation: Secondary | ICD-10-CM

## 2018-02-08 DIAGNOSIS — C61 Malignant neoplasm of prostate: Secondary | ICD-10-CM

## 2018-02-08 DIAGNOSIS — E1142 Type 2 diabetes mellitus with diabetic polyneuropathy: Secondary | ICD-10-CM

## 2018-02-08 DIAGNOSIS — Z79899 Other long term (current) drug therapy: Secondary | ICD-10-CM

## 2018-02-08 LAB — BRAIN NATRIURETIC PEPTIDE: B NATRIURETIC PEPTIDE 5: 761.4 pg/mL — AB (ref 0.0–100.0)

## 2018-02-08 MED ORDER — TORSEMIDE 20 MG PO TABS: 40.0000 mg | ORAL_TABLET | Freq: Two times a day (BID) | ORAL | 0 refills | Status: DC

## 2018-02-08 MED ORDER — GABAPENTIN 300 MG PO CAPS: 300.0000 mg | ORAL_CAPSULE | Freq: Two times a day (BID) | ORAL | 2 refills | Status: DC

## 2018-02-08 MED FILL — GABAPENTIN 300 MG CAPSULE: 300 | 30 days supply | Qty: 60 | Fill #0

## 2018-02-08 MED FILL — TORSEMIDE 20 MG TABLET: 20 | 30 days supply | Qty: 120 | Fill #0

## 2018-02-08 NOTE — Patient Instructions (Addendum)
Thank you for visiting clinic today. As we discussed I am making some changes to your fluid pill, not you will take a different pill called torsemide or Demadex instead of furosemide for better absorption. You will take torsemide 40 mg twice daily 1 in the morning and 1 in the afternoon. Should be making more urine after taking your fluid pill. We are checking some labs.,  Will call you with any abnormal results. We are also increasing the dose of Neurontin/gabapentin from 300 mg once daily to 300 mg twice daily, see if that will help with your neuropathic pain. I am also giving you an new referral to see a different neurologist. You also need to follow-up with your urologist. Please follow-up this Friday to see if that pill is helping with your fluid. See if there is an opening with your PCP this Friday otherwise we will see you in [facility].

## 2018-02-08 NOTE — Assessment & Plan Note (Signed)
Patient was having regular rate and rhythm during current clinic visit. Denies any palpitations or chest pain.  Continue Coreg, amiodarone and Xarelto.

## 2018-02-08 NOTE — Assessment & Plan Note (Signed)
His symptoms has been resolved.

## 2018-02-08 NOTE — Assessment & Plan Note (Signed)
He was complaining of worsening of his neuropathic pain, stating that gabapentin and Cymbalta is not helping much. Also asking for a different neurology practice who will take his orange card. He has orange card with CAFA letter. Per chart review he missed his appointment with [facility] neurology who takes [place] letter. He was telling me that he called [facility] neurology and there are not taking orange card.  He is just taking gabapentin 300 mg once daily.  Increase gabapentin to 300 mg twice daily. Continue Cymbalta 60 mg daily. We will clarify with him during next follow-up visit on Friday, so he can reschedule his appointment with [facility] neurology.

## 2018-02-08 NOTE — Assessment & Plan Note (Signed)
And has his laparoscopic radical prostatectomy done in May 2019. CT abdomen obtained because of his current hospitalization due to small bowel obstruction shows small sclerotic lesions on his femur, ischium and pubic rami. Patient denies any pain in his lower back or around hip.  -He needs to go back to his urologist for further evaluation about any metastatic disease. -I will leave that decision to his PCP.

## 2018-02-08 NOTE — Progress Notes (Signed)
CC: For hospital follow-up.  HPI:  [name] is a 62 y.o. with past medical history as listed below came to the clinic for follow-up of his recent hospitalization.  He was admitted at [facility] from August 30 till January 24, 2018 due to small bowel obstruction and pelvic abscess, cultures grew MRSA, he was initially treated with Zosyn, Flagyl and vancomycin, later transitioned to Bactrim and he was discharged on Bactrim to complete another 6 days course. Apparently patient never picked up his Bactrim until February 03, 2018 due to financial reasons, even now when he has the prescription he is taking once daily most of the days, stating that he forgets taking the second dose and would like to take his just once in the morning.  His abdominal pain has been resolved.  Drain was pulled out last week.  He is having regular bowel movements.  He was complaining of worsening lower extremity edema for the past 1 week, he continued to take his home dose of Lasix 40 mg twice daily with no change in his urination.  He has not noticed any worsening in his exertional dyspnea which he normally gets  after walking around the house.  He denies any orthopnea or PND.  Denies any chest pain.  He was having pain in his feet and lower extremities bilaterally due to edema.  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 He was asking to see a different neurologist who is able to take his orange card.  He has not taken any meds this morning before coming to the clinic.  Please see assessment and plan for his conditions.  Past Medical History:  Diagnosis Date  . Abscessed tooth    top back large cavity no pain or drainage, one on bottom  pt pulled tooth 4-5 months ago, right top large hole in tooth  . AKI (acute kidney injury) ([place])   .  Allergic rhinitis   . Atrial fibrillation ([place])   . BPH (benign prostatic hypertrophy)    Massive BPH noted on cystoscopy 1/23/ 2012 by Dr. Risa Grill.  . Cardiomyopathy ([place])   . CHF (congestive heart failure) ([place])   . Cough 03/30/2012  . Depression   . Diabetes mellitus 04/08/2008   type 2  . Foley catheter in place 07-05-17 placed  . Headache(784.0)    hx migraines none recent  . Hyperlipemia   . Hypertension   . Hypertensive cardiopathy 03/01/2006   2-D echocardiogram 02/01/2012 showed moderate LVH, mildly to moderately reduced left ventricular systolic function with an estimated ejection fraction of 40-45%, and diffuse hypokinesis.  A nuclear medicine stress study done 01/31/2012 showed no reversible ischemia, a small mid anterior wall fixed defect/infarct, and ejection fraction 42%.      . Neck pain   . Obstructive sleep apnea 03/06/2008   Sleep study 03/06/08 showed severe OSA/hypopnea syndrome, with successful CPAP titration to 13 CWP using a medium ResMed Mirage Quattro full face mask with heated humidifier.   . Rash 04/17/2014  . Renal calculus 05/29/2010   CT scan of abdomen/pelvis on 05/29/2010 showed an obstructing approximate 1-2 mm calculus at the left UVJ, and an approximate 1-2 mm left lower pole renal calculus.   Patient had continuing severe pain , and an elevation of his serum creatinine to a value of 1.75 on 06/06/2010.  The stone had  apparently passed and was not seen on repeat CT 06/08/2010.  [name] Tooth pain 10/29/2017  . Urinary straining 11/02/2016   Review of Systems: Negative except mentioned in HPI.  Physical Exam:  Vitals:   02/08/18 0934  BP: 132/83  Pulse: 78  Temp: (!) 97.5 F (36.4 C)  TempSrc: Oral  SpO2: 100%  Weight: 292 lb 9.6 oz (132.7 kg)  Height: 6\' 1"  (1.854 m)   General: Vital signs reviewed.  Patient is well-developed and obese, in no acute distress and cooperative with exam.  Head: Normocephalic and atraumatic. Eyes: EOMI, conjunctivae normal, no  scleral icterus.  Neck: Supple, trachea midline, normal ROM,  JVD to jawline, no masses, thyromegaly, or carotid bruit present.  Cardiovascular: RRR, S1 normal, S2 normal, no murmurs, gallops, or rubs. Pulmonary/Chest: Clear to auscultation bilaterally, no wheezes, rales, or rhonchi. Abdominal: Soft, non-tender, non-distended, BS +,  Extremities: 3+ lower extremity edema bilaterally,  pulses symmetric and intact bilaterally.  Skin: Warm, dry and intact. No rashes or erythema. Psychiatric: Normal mood and affect. speech and behavior is normal. Cognition and memory are normal.  Assessment & Plan:   See Encounters Tab for problem based charting.  Patient discussed with Dr. Angelia Mould.

## 2018-02-08 NOTE — Assessment & Plan Note (Addendum)
His complaints and exam were more consistent with being volume overload today. According to echo done during August 2019 shows 35 to 40% ejection fraction with diffuse hypokinesia and grade 2 diastolic dysfunction. Patient did not had any hypoxia, worsening dyspnea, orthopnea or PND at this time. His lungs were clear. According to patient he is compliant with his Lasix 40 mg twice daily but noticing no change in his urination after taking Lasix. He was also taking more Gatorade to keep himself hydrated, as he was having some electrolyte derangements during hospitalization.  BNP is elevated at 761.  -We will check  BMP and magnesium.  -Stop Lasix as if its bioavailability decreased with regard to edema. -Start him on torsemide 40 mg twice daily. -Reevaluate in 2 to 3 days. -Patient was given strict  Instructions to seek medical attention if he develops worsening dyspnea, orthopnea or PND.  Addendum.  Patient is having worsening of his renal function along with elevated BNP.  BMP also shows calcium of 8.5 but his albumin was low at 1.8 when checked during his hospitalization, his corrected calcium is 10.3 if I take that albumin, magnesium is within normal limit.  Potassium is within normal limit. Patient is also having worsening of hemoglobin at 9.1 and MCV of 72. He will need a repeat renal function panel, iron studies and ferritin level, and a UA to evaluate for any proteinuria. Patient is coming to see her PCP tomorrow, on Friday 02/10/18 at 2 PM. Called the patient and he is urinating well with torsemide and feeling less tense skin in his lower extremities. We will continue with torsemide with a close follow-up for a maintenance dose.

## 2018-02-08 NOTE — Assessment & Plan Note (Signed)
BP Readings from Last 3 Encounters:  02/08/18 132/83  02/02/18 (!) 180/118  01/24/18 111/64   Blood pressure was little above goal. He has not taken his morning meds yet.  We will continue with losartan, Coreg, Imdur.

## 2018-02-09 LAB — CBC WITH DIFFERENTIAL/PLATELET
BASOS ABS: 0 10*3/uL (ref 0.0–0.2)
Basos: 0 %
EOS (ABSOLUTE): 0.1 10*3/uL (ref 0.0–0.4)
Eos: 3 %
Hematocrit: 29 % — ABNORMAL LOW (ref 37.5–51.0)
Hemoglobin: 9.1 g/dL — ABNORMAL LOW (ref 13.0–17.7)
IMMATURE GRANS (ABS): 0 10*3/uL (ref 0.0–0.1)
IMMATURE GRANULOCYTES: 0 %
LYMPHS: 38 %
Lymphocytes Absolute: 1.4 10*3/uL (ref 0.7–3.1)
MCH: 22.7 pg — AB (ref 26.6–33.0)
MCHC: 31.4 g/dL — ABNORMAL LOW (ref 31.5–35.7)
MCV: 72 fL — ABNORMAL LOW (ref 79–97)
Monocytes Absolute: 0.3 10*3/uL (ref 0.1–0.9)
Monocytes: 9 %
NEUTROS PCT: 50 %
Neutrophils Absolute: 1.8 10*3/uL (ref 1.4–7.0)
Platelets: 176 10*3/uL (ref 150–450)
RBC: 4.01 x10E6/uL — ABNORMAL LOW (ref 4.14–5.80)
RDW: 18.9 % — ABNORMAL HIGH (ref 12.3–15.4)
WBC: 3.7 10*3/uL (ref 3.4–10.8)

## 2018-02-09 LAB — BMP8+ANION GAP
ANION GAP: 15 mmol/L (ref 10.0–18.0)
BUN/Creatinine Ratio: 9 — ABNORMAL LOW (ref 10–24)
BUN: 14 mg/dL (ref 8–27)
CALCIUM: 8.5 mg/dL — AB (ref 8.6–10.2)
CHLORIDE: 100 mmol/L (ref 96–106)
CO2: 20 mmol/L (ref 20–29)
Creatinine, Ser: 1.6 mg/dL — ABNORMAL HIGH (ref 0.76–1.27)
GFR calc Af Amer: 53 mL/min/{1.73_m2} — ABNORMAL LOW (ref >59)
GFR calc non Af Amer: 46 mL/min/{1.73_m2} — ABNORMAL LOW (ref >59)
GLUCOSE: 157 mg/dL — AB (ref 65–99)
POTASSIUM: 4 mmol/L (ref 3.5–5.2)
Sodium: 135 mmol/L (ref 134–144)

## 2018-02-09 LAB — MAGNESIUM: MAGNESIUM: 1.7 mg/dL (ref 1.6–2.3)

## 2018-02-09 NOTE — Progress Notes (Signed)
   CC: Heart failure follow up   HPI:  [name] is a 62 y.o. with chronic combined systolic and diastolic heart failure, essential hypertension, atrial fibrillation, obstructive sleep apnea, type 2 diabetes mellitus who presents for heart failure follow up. Please see problem based charting for evaluation, assessment, and plan.  Past Medical History:  Diagnosis Date  . Abscessed tooth    top back large cavity no pain or drainage, one on bottom  pt pulled tooth 4-5 months ago, right top large hole in tooth  . AKI (acute kidney injury) ([place])   . Allergic rhinitis   . Atrial fibrillation ([place])   . BPH (benign prostatic hypertrophy)    Massive BPH noted on cystoscopy 1/23/ 2012 by Dr. Risa Grill.  . Cardiomyopathy ([place])   . CHF (congestive heart failure) ([place])   . Cough 03/30/2012  . Depression   . Diabetes mellitus 04/08/2008   type 2  . Foley catheter in place 07-05-17 placed  . Headache(784.0)    hx migraines none recent  . Hyperlipemia   . Hypertension   . Hypertensive cardiopathy 03/01/2006   2-D echocardiogram 02/01/2012 showed moderate LVH, mildly to moderately reduced left ventricular systolic function with an estimated ejection fraction of 40-45%, and diffuse hypokinesis.  A nuclear medicine stress study done 01/31/2012 showed no reversible ischemia, a small mid anterior wall fixed defect/infarct, and ejection fraction 42%.      . Neck pain   . Obstructive sleep apnea 03/06/2008   Sleep study 03/06/08 showed severe OSA/hypopnea syndrome, with successful CPAP titration to 13 CWP using a medium ResMed Mirage Quattro full face mask with heated humidifier.   . Rash 04/17/2014  . Renal calculus 05/29/2010   CT scan of abdomen/pelvis on 05/29/2010 showed an obstructing approximate 1-2 mm calculus at the left UVJ, and an approximate 1-2 mm left lower pole renal calculus.   Patient had continuing severe pain , and an elevation of his serum creatinine to a value of 1.75 on 06/06/2010.   The stone had apparently passed and was not seen on repeat CT 06/08/2010.  [name] Tooth pain 10/29/2017  . Urinary straining 11/02/2016   Review of Systems:    Review of Systems  Constitutional: Negative for chills, fever and weight loss.  Cardiovascular: Negative for chest pain.  Gastrointestinal: Negative for nausea and vomiting.  Genitourinary: Negative for frequency and urgency.  Musculoskeletal: Negative for falls.  Neurological: Negative for dizziness.     Physical Exam:  Vitals:   02/10/18 1428 02/10/18 1507  BP: (!) 146/76 132/77  Pulse: 80 74  Temp: 97.9 F (36.6 C)   TempSrc: Oral   SpO2: 100%   Weight: 274 lb 12.8 oz (124.6 kg)    Physical Exam  Constitutional: He appears well-developed and well-nourished. No distress.  HENT:  Head: Normocephalic and atraumatic.  Eyes: Conjunctivae are normal.  Cardiovascular: Normal rate, regular rhythm and normal heart sounds.  Respiratory: Effort normal and breath sounds normal. No respiratory distress. He has no wheezes.  GI: Soft. Bowel sounds are normal. He exhibits no distension. There is no tenderness.  Musculoskeletal: He exhibits edema (2+ pitting in bilateral lower extremities) and tenderness (in bilateral lower extremities).  Neurological: He is alert.  Skin: He is not diaphoretic.  Psychiatric: He has a normal mood and affect. His behavior is normal. Judgment and thought content normal.     Assessment & Plan:   See Encounters Tab for problem based charting.  Patient discussed with Dr. Dareen Piano

## 2018-02-10 ENCOUNTER — Ambulatory Visit (INDEPENDENT_AMBULATORY_CARE_PROVIDER_SITE_OTHER): Payer: Self-pay | Admitting: Dietician

## 2018-02-10 ENCOUNTER — Encounter: Payer: Self-pay | Admitting: Dietician

## 2018-02-10 ENCOUNTER — Encounter: Payer: Self-pay | Admitting: Internal Medicine

## 2018-02-10 ENCOUNTER — Ambulatory Visit (INDEPENDENT_AMBULATORY_CARE_PROVIDER_SITE_OTHER): Payer: Self-pay | Admitting: Internal Medicine

## 2018-02-10 ENCOUNTER — Other Ambulatory Visit: Payer: Self-pay

## 2018-02-10 VITALS — BP 132/77 | HR 74 | Temp 97.9°F | Wt 274.8 lb

## 2018-02-10 DIAGNOSIS — I4819 Other persistent atrial fibrillation: Secondary | ICD-10-CM

## 2018-02-10 DIAGNOSIS — E1122 Type 2 diabetes mellitus with diabetic chronic kidney disease: Secondary | ICD-10-CM

## 2018-02-10 DIAGNOSIS — N183 Chronic kidney disease, stage 3 unspecified: Secondary | ICD-10-CM

## 2018-02-10 DIAGNOSIS — K56609 Unspecified intestinal obstruction, unspecified as to partial versus complete obstruction: Secondary | ICD-10-CM

## 2018-02-10 DIAGNOSIS — D509 Iron deficiency anemia, unspecified: Secondary | ICD-10-CM

## 2018-02-10 DIAGNOSIS — Z79899 Other long term (current) drug therapy: Secondary | ICD-10-CM

## 2018-02-10 DIAGNOSIS — I130 Hypertensive heart and chronic kidney disease with heart failure and stage 1 through stage 4 chronic kidney disease, or unspecified chronic kidney disease: Secondary | ICD-10-CM

## 2018-02-10 DIAGNOSIS — E1142 Type 2 diabetes mellitus with diabetic polyneuropathy: Secondary | ICD-10-CM

## 2018-02-10 DIAGNOSIS — I10 Essential (primary) hypertension: Secondary | ICD-10-CM

## 2018-02-10 DIAGNOSIS — I5042 Chronic combined systolic (congestive) and diastolic (congestive) heart failure: Secondary | ICD-10-CM

## 2018-02-10 DIAGNOSIS — R5381 Other malaise: Secondary | ICD-10-CM

## 2018-02-10 DIAGNOSIS — Z0000 Encounter for general adult medical examination without abnormal findings: Secondary | ICD-10-CM

## 2018-02-10 DIAGNOSIS — Z794 Long term (current) use of insulin: Secondary | ICD-10-CM

## 2018-02-10 DIAGNOSIS — G4733 Obstructive sleep apnea (adult) (pediatric): Secondary | ICD-10-CM

## 2018-02-10 DIAGNOSIS — Z2821 Immunization not carried out because of patient refusal: Secondary | ICD-10-CM

## 2018-02-10 LAB — GLUCOSE, CAPILLARY: Glucose-Capillary: 228 mg/dL — ABNORMAL HIGH (ref 70–99)

## 2018-02-10 NOTE — Patient Instructions (Signed)
Please record the time, amount and what food drinks and activities you have while wearing the continuous glucose monitor(CGM).  Bring the folder with you to follow up appointments  Do not have a CT or an MRI while wearing the CGM.   Please make an appointment for 1 week with me and a doctor for the first of two CGM downloads..   You will also return in 2 weeks to have your second download and the CGM remove  Vonna Brabson [phone] 

## 2018-02-10 NOTE — Progress Notes (Signed)
Documentation for Freestyle Libre Pro Continuous glucose monitoring Freestyle Libre Pro CGM sensor placed and started after consent was given by Terry Parks who was identified by name and date of birth.  Patient was educated about wearing sensor, keeping food, activity and medication log and when to call office.He was educated about how to care for the sensor and not to have an MRI, CT or Diathermy while wearing the sensorwhat and what he can learn from the Continuous glucose monitoring. Follow up was arranged with the patient for 1 week and 2 weeks to see a doctor and diabetes educator. Terry Parks says he has not taken his insulin in a week. Asked him to please record days when he takes his insulin and how much he takes. He verbalized understanding.  Lot #:[id number] Serial #:[id number] Expiration Date: Terry Parks, RD 02/10/2018 3:29 PM.

## 2018-02-10 NOTE — Patient Instructions (Signed)
It was a pleasure to see you today [name].   -Please continue taking torsemide 40mg  twice daily  -I have checked some labs during this visit and will let you know if there are any changes to your medications after I review them  If you have any questions or concerns, please call our clinic at [phone] between 9am-5pm and after hours call [phone] and ask for the internal medicine resident on call. If you feel you are having a medical emergency please call 911.   Thank you, we look forward to help you remain healthy!  [name], MD Internal Medicine PGY2

## 2018-02-11 ENCOUNTER — Encounter: Payer: Self-pay | Admitting: Internal Medicine

## 2018-02-11 DIAGNOSIS — N183 Chronic kidney disease, stage 3 unspecified: Secondary | ICD-10-CM | POA: Insufficient documentation

## 2018-02-11 DIAGNOSIS — R718 Other abnormality of red blood cells: Secondary | ICD-10-CM | POA: Insufficient documentation

## 2018-02-11 DIAGNOSIS — Z0000 Encounter for general adult medical examination without abnormal findings: Secondary | ICD-10-CM | POA: Insufficient documentation

## 2018-02-11 DIAGNOSIS — R5381 Other malaise: Secondary | ICD-10-CM | POA: Insufficient documentation

## 2018-02-11 DIAGNOSIS — E1122 Type 2 diabetes mellitus with diabetic chronic kidney disease: Secondary | ICD-10-CM | POA: Insufficient documentation

## 2018-02-11 NOTE — Assessment & Plan Note (Signed)
The patient's blood pressure during this visit was 146/76 with repeat being 132/77. The patient is currently taking losartan 50mg  qd,amlodipine 5mg , and torsemide 40mg  bid. His last blood pressure visits are BP Readings from Last 3 Encounters:  02/10/18 132/77  02/08/18 132/83  02/02/18 (!) 180/118   The patient does/does not report palpitations, dizziness, chest pain, sob.  Plan Patient's blood pressure is at goal, will recommend continuing amlodipine 5mg  qd and losartan 50mg  qd. Will discuss whether to continue torsemide at current dose at follow up appointment and based on laboratory data.

## 2018-02-11 NOTE — Assessment & Plan Note (Signed)
The patient's creatinine has increased to 1.4-1.6 since May 2019. His usual baseline is 1.0-1.1. His calcium has also been low. With a GFR in the 50s that places him at CKD stage 3.   Assessment and plan  Will check renal function panel due to the patient's worsening renal function, low calcium. Also getting ua to check for proteinuria.

## 2018-02-11 NOTE — Assessment & Plan Note (Signed)
Patient presented for follow up subsequent to his visit 3 days ago. The patient's weight during this visit is 274lbs which is down from his previous weight of 292.6 2 days ago. At previous visit the patient had a bnp of 761 and appeared volume overloaded on exam and the patient was started on Torsemide 40mg  bid.   Assessment and plan  The patient appears to continue to be volume overloaded on exam, but his volume status has improved from previous. He states that he does not have any subjective orthopnea, dyspnea, or pnd. His bilateral lower extremities continue to have pitting edema.   Ordered a bmp to check kidney function. Will have the patient continue on torsemide 40mg  bid currently and have close follow up to evaluate volume status next week.   ADDENDUM The patient left clinic without going to lab. Patient was called and he mentioned that he will return to give blood on Monday 02/13/18.

## 2018-02-11 NOTE — Assessment & Plan Note (Signed)
Patient refused influenza vaccination 

## 2018-02-11 NOTE — Assessment & Plan Note (Signed)
The patient states that since his recent hospital discharge (8/30-9/10) the patient has been feeling "wobbly" when he walks. He denied any sob, chest pain, dizziness, lightheadedness, or recent falls.   The patient was bedbound for a significant part of his hospitalization and he refused his physical therapy sessions.   Assessment and plan  It is likely that the patient has some physical deconditioning due to him not having much physical activity. Will refer to physical therapy for him to work on movement.

## 2018-02-11 NOTE — Assessment & Plan Note (Addendum)
The patient has mcv in 33s with increased rdw (17-18). His ferritin level was 118 on 01/09/18.   Assessment and plan  Will get iron, tibc to further evaluate the patient's anemia and determine whether due to iron deficiency or from chronic disease.   ADDENDUM The patient's iron=27, iron saturation=8, ferritin=118, and mcv=72. The patient's low iron, iron saturation,and normal ferritin makes it likely that the patient's anemia is from chronic disease (CKD). Will order colonoscopy to ensure no concomitant GI bleed, refer to nephrology for possible aranesp administration, and will give oral iron supplementation with ferrous sulfate 65mg  qd.

## 2018-02-11 NOTE — Assessment & Plan Note (Addendum)
The patient is taking glargine 60u, metformin 1000mg  bid. The patient's last a1c=9.9 on 01/09/18. The patient states that his home blood glucose measurements over the past month have ranged 140-150s. Since his blood glucose readings have been low he mentioned that he has stopped taking his insulin for the past 4-5days.   The patient's random blood glucose reading was 228 at this visit.   He is currently being prescribed metformin 1000mg  bid and lantus 60qd.  Assessment and plan  The patient's blood glucose levels are currently not under control, but it is not advisable to place the patient back on his lantus 60u dose if he is truly having blood glucose reading of 140-150s at home.   In order to further understand what the patient's blood glucose readings are, we placed a CGM monitor on him during this clinic encounter. Further information regarding this has been documented by Ms. [name] Plyer in her portion of the note. The patient will return in 1 week to check blood glucose levels.   In the meantime the patient will be continued on metformin 1000mg  bid.

## 2018-02-13 ENCOUNTER — Other Ambulatory Visit (INDEPENDENT_AMBULATORY_CARE_PROVIDER_SITE_OTHER): Payer: Self-pay

## 2018-02-13 DIAGNOSIS — D509 Iron deficiency anemia, unspecified: Secondary | ICD-10-CM

## 2018-02-13 DIAGNOSIS — N183 Chronic kidney disease, stage 3 (moderate): Secondary | ICD-10-CM

## 2018-02-13 NOTE — Progress Notes (Signed)
Internal Medicine Clinic Attending  Case discussed with Dr. Amin at the time of the visit.  We reviewed the resident's history and exam and pertinent patient test results.  I agree with the assessment, diagnosis, and plan of care documented in the resident's note.    

## 2018-02-13 NOTE — Progress Notes (Signed)
Internal Medicine Clinic Attending  Case discussed with Dr. [name] at the time of the visit.  We reviewed the resident's history and exam and pertinent patient test results.  I agree with the assessment, diagnosis, and plan of care documented in the resident's note. 

## 2018-02-14 LAB — URINALYSIS, COMPLETE
Bilirubin, UA: NEGATIVE
Ketones, UA: NEGATIVE
Leukocytes, UA: NEGATIVE
Nitrite, UA: NEGATIVE
Protein, UA: NEGATIVE
RBC, UA: NEGATIVE
Specific Gravity, UA: 1.009 (ref 1.005–1.030)
Urobilinogen, Ur: 0.2 mg/dL (ref 0.2–1.0)
pH, UA: 6 (ref 5.0–7.5)

## 2018-02-14 LAB — CMP14 + ANION GAP
ALT: 31 IU/L (ref 0–44)
AST: 21 IU/L (ref 0–40)
Albumin/Globulin Ratio: 0.8 — ABNORMAL LOW (ref 1.2–2.2)
Albumin: 3.5 g/dL — ABNORMAL LOW (ref 3.6–4.8)
Alkaline Phosphatase: 114 IU/L (ref 39–117)
Anion Gap: 12 mmol/L (ref 10.0–18.0)
BUN/Creatinine Ratio: 7 — ABNORMAL LOW (ref 10–24)
BUN: 12 mg/dL (ref 8–27)
Bilirubin Total: 0.3 mg/dL (ref 0.0–1.2)
CO2: 27 mmol/L (ref 20–29)
Calcium: 8.8 mg/dL (ref 8.6–10.2)
Chloride: 96 mmol/L (ref 96–106)
Creatinine, Ser: 1.64 mg/dL — ABNORMAL HIGH (ref 0.76–1.27)
GFR calc Af Amer: 52 mL/min/{1.73_m2} — ABNORMAL LOW (ref >59)
GFR calc non Af Amer: 45 mL/min/{1.73_m2} — ABNORMAL LOW (ref >59)
Globulin, Total: 4.2 g/dL (ref 1.5–4.5)
Glucose: 269 mg/dL — ABNORMAL HIGH (ref 65–99)
Potassium: 4.2 mmol/L (ref 3.5–5.2)
Sodium: 135 mmol/L (ref 134–144)
Total Protein: 7.7 g/dL (ref 6.0–8.5)

## 2018-02-14 LAB — MICROSCOPIC EXAMINATION: Casts: NONE SEEN /lpf

## 2018-02-14 LAB — IRON AND TIBC
Iron Saturation: 8 % — CL (ref 15–55)
Iron: 27 ug/dL — ABNORMAL LOW (ref 38–169)
Total Iron Binding Capacity: 326 ug/dL (ref 250–450)
UIBC: 299 ug/dL (ref 111–343)

## 2018-02-14 NOTE — Addendum Note (Signed)
Addended by: [name] on: 02/14/2018 01:29 PM   Modules accepted: Orders

## 2018-02-16 MED ORDER — FERROUS SULFATE 325 (65 FE) MG PO TBEC: 325.0000 mg | DELAYED_RELEASE_TABLET | Freq: Every day | ORAL | 0 refills | Status: DC

## 2018-02-16 NOTE — Addendum Note (Signed)
Addended by: [name] on: 02/16/2018 08:44 AM   Modules accepted: Orders

## 2018-02-17 ENCOUNTER — Ambulatory Visit: Payer: Self-pay | Admitting: Dietician

## 2018-02-17 ENCOUNTER — Ambulatory Visit (INDEPENDENT_AMBULATORY_CARE_PROVIDER_SITE_OTHER): Payer: Self-pay | Admitting: Internal Medicine

## 2018-02-17 ENCOUNTER — Other Ambulatory Visit: Payer: Self-pay | Admitting: *Deleted

## 2018-02-17 ENCOUNTER — Other Ambulatory Visit: Payer: Self-pay | Admitting: Internal Medicine

## 2018-02-17 ENCOUNTER — Encounter: Payer: Self-pay | Admitting: Dietician

## 2018-02-17 ENCOUNTER — Encounter: Payer: Self-pay | Admitting: Internal Medicine

## 2018-02-17 ENCOUNTER — Other Ambulatory Visit: Payer: Self-pay

## 2018-02-17 VITALS — BP 150/87 | HR 83 | Temp 98.8°F | Ht 73.0 in | Wt 268.7 lb

## 2018-02-17 DIAGNOSIS — Z794 Long term (current) use of insulin: Secondary | ICD-10-CM

## 2018-02-17 DIAGNOSIS — E1142 Type 2 diabetes mellitus with diabetic polyneuropathy: Secondary | ICD-10-CM

## 2018-02-17 DIAGNOSIS — I4819 Other persistent atrial fibrillation: Secondary | ICD-10-CM

## 2018-02-17 DIAGNOSIS — Z7984 Long term (current) use of oral hypoglycemic drugs: Secondary | ICD-10-CM

## 2018-02-17 DIAGNOSIS — I10 Essential (primary) hypertension: Secondary | ICD-10-CM

## 2018-02-17 MED ORDER — INSULIN GLARGINE 100 UNIT/ML SOLOSTAR PEN: 50.0000 [IU] | PEN_INJECTOR | Freq: Every day | SUBCUTANEOUS | 0 refills | Status: DC

## 2018-02-17 MED ORDER — INSULIN DETEMIR 100 UNIT/ML ~~LOC~~ SOLN: 50.0000 [IU] | Freq: Every day | SUBCUTANEOUS | 0 refills | Status: DC

## 2018-02-17 MED ORDER — INSULIN GLARGINE 100 UNIT/ML SOLOSTAR PEN: 60.0000 [IU] | PEN_INJECTOR | Freq: Every day | SUBCUTANEOUS | 11 refills | Status: DC

## 2018-02-17 NOTE — Progress Notes (Signed)
His average glucose x6 days on cgm was 246 with most readings between 180 and 350. He says he has not taken any diabetes medicine in more than a month. I think Dr. Berline Lopes restarted them today.

## 2018-02-17 NOTE — Assessment & Plan Note (Signed)
  Diabetes: CGM placed on 09/27. Terry Parks wore the CGM for 7 days. The average reading was 246, % time in target was 18, % time below target was 0, and % time above target was 82. Intervention will be to prescribe him the correct insulin and have him take his metformin. The patient will be scheduled to see [place] in [facility] for a final appointment. He has not been on his insulin as it was not sent by [place] [facility]. It appears that a script for Pen they do not carry was sent over. We have attempted to provide a refill today for [name] for Solostar pens as it appears they carry Eli-Lilly brand Basaglar pens.  Plan: Lantus script sent to [facility] 50[facility] daily to supply insulin until his mail order issue is resolved. Refilled script for lantus pens 60U daily from [facility].  Patient to return to clinic in 7 days Patient instructed to continue Metformin 1000 mg BID  Dose discrepancy based on affordability issues of patient and lack of recent use. Will start with 50U and uptitrate as indicated at his next appointment

## 2018-02-17 NOTE — Progress Notes (Signed)
CC: diabetes follow up  HPI:[name] is a 62 y.o. male who presents for evaluation of his diabetes. Please see individual problem based A/P for details.  Diabetes: CGM placed on 09/27. Terry Parks wore the CGM for 7 days. The average reading was 246, % time in target was 18, % time below target was 0, and % time above target was 82. Intervention will be to prescribe him the correct insulin and have him take his metformin. The patient will be scheduled to see [place] in [facility] for a final appointment. He has not been on his insulin as it was not sent by [facility]. It appears that a script for Pen they do not carry was sent over. We have attempted to provide a refill today for [facility] for Solostar pens as it appears they carry Eli-Lilly brand Basaglar pens.  Plan: Lantus script sent to [facility] 50U daily to supply insulin until his mail order issue is resolved. Refilled script for lantus pens 60U daily from [facility].  Patient to return to clinic in 7 days Patient instructed to continue Metformin 1000 mg BID  Dose discrepancy based on affordability issues of patient and lack of recent use. Will start with 50U and uptitrate as indicated at his next appointment   Past Medical History:  Diagnosis Date  . Abscessed tooth    top back large cavity no pain or drainage, one on bottom  pt pulled tooth 4-5 months ago, right top large hole in tooth  . AKI (acute kidney injury) ([place])   . Allergic rhinitis   . Atrial fibrillation ([place])   . BPH (benign prostatic hypertrophy)    Massive BPH noted on cystoscopy 1/23/ 2012 by Dr. Risa Grill.  . Cardiomyopathy ([place])   . CHF (congestive heart failure) ([place])   . Cough 03/30/2012  . Depression   . Diabetes mellitus 04/08/2008   type 2  . Foley catheter in place 07-05-17 placed  . Headache(784.0)    hx migraines none recent  . Hyperlipemia   . Hypertension   . Hypertensive cardiopathy 03/01/2006   2-D  echocardiogram 02/01/2012 showed moderate LVH, mildly to moderately reduced left ventricular systolic function with an estimated ejection fraction of 40-45%, and diffuse hypokinesis.  A nuclear medicine stress study done 01/31/2012 showed no reversible ischemia, a small mid anterior wall fixed defect/infarct, and ejection fraction 42%.      . Neck pain   . Nephrolithiasis 05/29/2010   CT scan of abdomen/pelvis on 05/29/2010 showed an obstructing approximate 1-2 mm calculus at the left UVJ, and an approximate 1-2 mm left lower pole renal calculus.   Patient had continuing severe pain , and an elevation of his serum creatinine to a value of 1.75 on 06/06/2010.  Patient underwent cystoscopy on 06/08/2010 by Dr. Risa Grill, but attempts at retrograde pyelogram and ureteroscopy were unsucc  . Numbness 01/08/2018  . Obstructive sleep apnea 03/06/2008   Sleep study 03/06/08 showed severe OSA/hypopnea syndrome, with successful CPAP titration to 13 CWP using a medium ResMed Mirage Quattro full face mask with heated humidifier.   . Rash 04/17/2014  . Renal calculus 05/29/2010   CT scan of abdomen/pelvis on 05/29/2010 showed an obstructing approximate 1-2 mm calculus at the left UVJ, and an approximate 1-2 mm left lower pole renal calculus.   Patient had continuing severe pain , and an elevation of his serum creatinine to a value of 1.75 on 06/06/2010.  The stone had apparently passed and  was not seen on repeat CT 06/08/2010.  [name] Tooth pain 10/29/2017  . Urinary straining 11/02/2016   Review of Systems:  ROS negative except as per HPI.  Physical Exam: Vitals:   02/17/18 0943  BP: (!) 150/87  Pulse: 83  Temp: 98.8 F (37.1 C)  TempSrc: Oral  SpO2: 100%  Weight: 268 lb 11.2 oz (121.9 kg)  Height: 6\' 1"  (1.854 m)   General: A/O x4, in no acute distress, afebrile, nondiaphoretic Cardio: RRR, no mrg's Pulmonary: CTA bilaterally MSK: Bilateral lower extremities nontender, non edematous  Assessment & Plan:   See  Encounters Tab for problem based charting.  Patient discussed with Dr. Dareen Piano

## 2018-02-17 NOTE — Progress Notes (Signed)
[name] is here for download #1 of his CGM. The sensor stopped working yesterday, recording 6 days of glucose values. [name] reports still working on making changes to his eating and drinking behaviors. He is willing to do "everything in moderation".  His family and friends are encouraging him to do more. He reports not having taken his insulin or metformin (his only diabetes medicine) for more than 1 month. He is out of insulin and does not know where to get more. I called [facility] and they said he needs to call them for refills. They refilled his insulin today and requested other refills were where given to the triage nurse.  Plan is for [name] to restart his insulin and metformin and continue working on lifestyle changes.  Recommend follow up with me in 2-4 weeks.  [name] Plyler, RD 02/17/2018 1:15 PM.

## 2018-02-17 NOTE — Patient Instructions (Signed)
Keep working on eating more vegetables, lean protein like chicken fish and [place], nuts, avocados, peanut butter.   Drink mostly water, unsweetened tea, coffee and milk- can be soy or almond milk.   I made an appointment for you to see me in 2 weeks- hope this works for you.   Please bring your meter.  [name] [phone]

## 2018-02-17 NOTE — Patient Instructions (Signed)
Please return in 7 days with Amaya for Download #2.   FOLLOW-UP INSTRUCTIONS When: 7 days  Thank you for your visit to the [facility] today.

## 2018-02-20 MED ORDER — NYSTATIN 100000 UNIT/GM EX CREA: 1.0000 "application " | TOPICAL_CREAM | CUTANEOUS | 1 refills | Status: DC

## 2018-02-20 MED ORDER — AMLODIPINE-ATORVASTATIN 5-40 MG PO TABS: 1.0000 | ORAL_TABLET | Freq: Every day | ORAL | 1 refills | Status: DC

## 2018-02-20 NOTE — Addendum Note (Signed)
Addended by: Aldine Contes on: 02/20/2018 09:37 AM   Modules accepted: Level of Service

## 2018-02-20 NOTE — Progress Notes (Signed)
Internal Medicine Clinic Attending  Case discussed with Dr. Harbrecht at the time of the visit. We reviewed the resident's history and exam and pertinent patient test results. I personally reviewed the CGM data & the resident's interpretation. I agree with the assessment, diagnosis, and plan of care documented in the resident's note.   

## 2018-02-22 ENCOUNTER — Telehealth: Payer: Self-pay | Admitting: *Deleted

## 2018-02-22 MED FILL — LANTUS SOLOSTAR 100 UNITS/M: 100 | 30 days supply | Qty: 15 | Fill #0

## 2018-02-22 NOTE — Telephone Encounter (Signed)
Terry [facility] from [facility] calls and states that they got a levemir script and lantus script. She is ask to void the levemir and do the lantus for 1 month then [place] [facility] should kick in with a new script.

## 2018-03-03 ENCOUNTER — Encounter: Payer: Self-pay | Admitting: Dietician

## 2018-03-03 ENCOUNTER — Ambulatory Visit (INDEPENDENT_AMBULATORY_CARE_PROVIDER_SITE_OTHER): Payer: Self-pay | Admitting: Dietician

## 2018-03-03 DIAGNOSIS — Z713 Dietary counseling and surveillance: Secondary | ICD-10-CM

## 2018-03-03 DIAGNOSIS — Z6836 Body mass index (BMI) 36.0-36.9, adult: Secondary | ICD-10-CM

## 2018-03-03 DIAGNOSIS — E1142 Type 2 diabetes mellitus with diabetic polyneuropathy: Secondary | ICD-10-CM

## 2018-03-03 NOTE — Patient Instructions (Addendum)
Take IRON with Vitamin C- 4 oz. orange juice, orange, grapefruit, green peppers  Keep a look out for low blood sugar and let us know if you think you have any or do have any.  Keep up the great work walking, eating lower dosing and healthier!  Whole grains...  Make and appointment for 1 month.  [name] [phone]

## 2018-03-03 NOTE — Progress Notes (Signed)
Diabetes Self-Management Education  Visit Type: First/Initial  Appt. Start Time: 115 Appt. End Time: 200  03/03/2018  [name], identified by name and date of birth, is a 62 y.o. male with a diagnosis of Diabetes: Type 2.   ASSESSMENT  Weight 277 lb 12.8 oz (126 kg). Body mass index is 36.65 kg/m.  Diabetes Self-Management Education - 03/03/18 1600      Visit Information   Visit Type  First/Initial      Initial Visit   Diabetes Type  Type 2    Are you currently following a meal plan?  Yes   to the best of his ability   What type of meal plan do you follow?  lower sugar, snaller portions, trying to eat lower sodium    Are you taking your medications as prescribed?  Yes   sometimes misses- lantus 3 days in last 2 weeks     Health Coping   How would you rate your overall health?  Fair   "better"     Psychosocial Assessment   Patient Belief/Attitude about Diabetes  Motivated to manage diabetes    Self-care barriers  Lack of transportation;Lack of material resources   competing values   Self-management support  Doctor's office;Friends;Family;CDE visits    Patient Concerns  Nutrition/Meal planning;Glycemic Control    Special Needs  None    Preferred Learning Style  No preference indicated    Learning Readiness  Contemplating    How often do you need to have someone help you when you read instructions, pamphlets, or other written materials from your doctor or pharmacy?  1 - Never    What is the last grade level you completed in school?  12      Pre-Education Assessment   Patient understands incorporating nutritional management into lifestyle.  Needs Review    Patient understands using medications safely.  Needs Review    Patient understands monitoring blood glucose, interpreting and using results  Demonstrates understanding / competency    Patient understands prevention, detection, and treatment of acute complications.  Needs Review    Patient understands how  to develop strategies to promote health/change behavior.  Needs Review      Complications   Last HgB A1C per patient/outside source  9.9 %      Dietary Intake   Breakfast  --   8-9 AM)- coffee & hot pocket ( 3 days a week) pills x 3 days   Lunch  --   11- 12 PM)- pills on some days, salad, half sub    Snack (afternoon)  --    half a [employer] steak & cheese sub   Dinner  --   half a subway steak & cheese sub   Snack (evening)  --   grapes and cheese   Beverage(s)  --   water,  milk, regular 2 liters/10 days soda- ginger ale     Exercise   Exercise Type  ADL's;Light (walking / raking leaves)    How many days per week to you exercise?  5    How many minutes per day do you exercise?  30    Total minutes per week of exercise  150      Patient Education   Previous Diabetes Education  Yes (please comment)   here   Nutrition management   Role of diet in the treatment of diabetes and the relationship between the three main macronutrients and blood glucose level;Information on hints to eating out and maintain  blood glucose control.;Meal options for control of blood glucose level and chronic complications.    Medications  Reviewed patients medication for diabetes, action, purpose, timing of dose and side effects.    Acute complications  Taught treatment of hypoglycemia - the 15 rule.    Personal strategies to promote health  Helped patient develop diabetes management plan for (enter comment)   continuing improved self care     Individualized Goals (developed by patient)   Health Coping  ask for help with (comment)   low blood sugar     Outcomes   Expected Outcomes  Demonstrated interest in learning. Expect positive outcomes    Future DMSE  4-6 wks    Program Status  Completed       Individualized Plan for Diabetes Self-Management Training:   Learning Objective:  Patient will have a greater understanding of diabetes self-management. Patient education plan is to attend individual  and/or group sessions per assessed needs and concerns.   Plan:   Patient Instructions  Take IRON with Vitamin C- 4 oz. orange juice, orange, grapefruit, green peppers  Keep a look out for low blood sugar and let us know if you think you have any or do have any.  Keep up the great work walking, eating lower dosing and healthier!  Whole grains...  Make and appointment for 1 month.  [name] [phone]  Expected Outcomes:  Demonstrated interest in learning. Expect positive outcomes Education material provided: avs and print outs from subway, grocery If problems or questions, patient to contact team via:  Phone Future DSME appointment: 4-6 wks  Debera Lat, RD 03/03/2018 4:49 PM.

## 2018-03-10 ENCOUNTER — Encounter: Payer: Self-pay | Admitting: Internal Medicine

## 2018-03-10 ENCOUNTER — Other Ambulatory Visit: Payer: Self-pay

## 2018-03-10 ENCOUNTER — Ambulatory Visit (INDEPENDENT_AMBULATORY_CARE_PROVIDER_SITE_OTHER): Payer: Self-pay | Admitting: Internal Medicine

## 2018-03-10 DIAGNOSIS — I5042 Chronic combined systolic (congestive) and diastolic (congestive) heart failure: Secondary | ICD-10-CM

## 2018-03-10 DIAGNOSIS — Z8546 Personal history of malignant neoplasm of prostate: Secondary | ICD-10-CM

## 2018-03-10 DIAGNOSIS — N183 Chronic kidney disease, stage 3 (moderate): Secondary | ICD-10-CM

## 2018-03-10 DIAGNOSIS — I130 Hypertensive heart and chronic kidney disease with heart failure and stage 1 through stage 4 chronic kidney disease, or unspecified chronic kidney disease: Secondary | ICD-10-CM

## 2018-03-10 DIAGNOSIS — E1122 Type 2 diabetes mellitus with diabetic chronic kidney disease: Secondary | ICD-10-CM

## 2018-03-10 DIAGNOSIS — K429 Umbilical hernia without obstruction or gangrene: Secondary | ICD-10-CM

## 2018-03-10 DIAGNOSIS — G4733 Obstructive sleep apnea (adult) (pediatric): Secondary | ICD-10-CM

## 2018-03-10 DIAGNOSIS — K469 Unspecified abdominal hernia without obstruction or gangrene: Secondary | ICD-10-CM | POA: Insufficient documentation

## 2018-03-10 DIAGNOSIS — Z9079 Acquired absence of other genital organ(s): Secondary | ICD-10-CM

## 2018-03-10 DIAGNOSIS — K439 Ventral hernia without obstruction or gangrene: Secondary | ICD-10-CM

## 2018-03-10 NOTE — Progress Notes (Signed)
CC: Abdominal puffiness  HPI:  [name] is a 62 y.o. with type 2 diabetes mellitus, essential hypertension, chronic combined systolic and diastolic heart failure, OSA, CKD 3, prostate cancer s/p scopic radical prostatectomy done in May 2019 who presents for abdominal puffiness. Please see problem based charting for evaluation, assessment, and plan.  Past Medical History:  Diagnosis Date  . Abscessed tooth    top back large cavity no pain or drainage, one on bottom  pt pulled tooth 4-5 months ago, right top large hole in tooth  . AKI (acute kidney injury) ([place])   . Allergic rhinitis   . Atrial fibrillation ([place])   . BPH (benign prostatic hypertrophy)    Massive BPH noted on cystoscopy 1/23/ 2012 by Dr. Risa Grill.  . Cardiomyopathy ([place])   . CHF (congestive heart failure) ([place])   . Cough 03/30/2012  . Depression   . Diabetes mellitus 04/08/2008   type 2  . Foley catheter in place 07-05-17 placed  . Headache(784.0)    hx migraines none recent  . Hyperlipemia   . Hypertension   . Hypertensive cardiopathy 03/01/2006   2-D echocardiogram 02/01/2012 showed moderate LVH, mildly to moderately reduced left ventricular systolic function with an estimated ejection fraction of 40-45%, and diffuse hypokinesis.  A nuclear medicine stress study done 01/31/2012 showed no reversible ischemia, a small mid anterior wall fixed defect/infarct, and ejection fraction 42%.      . Neck pain   . Nephrolithiasis 05/29/2010   CT scan of abdomen/pelvis on 05/29/2010 showed an obstructing approximate 1-2 mm calculus at the left UVJ, and an approximate 1-2 mm left lower pole renal calculus.   Patient had continuing severe pain , and an elevation of his serum creatinine to a value of 1.75 on 06/06/2010.  Patient underwent cystoscopy on 06/08/2010 by Dr. Risa Grill, but attempts at retrograde pyelogram and ureteroscopy were unsucc  . Numbness 01/08/2018  . Obstructive sleep apnea 03/06/2008   Sleep study 03/06/08  showed severe OSA/hypopnea syndrome, with successful CPAP titration to 13 CWP using a medium ResMed Mirage Quattro full face mask with heated humidifier.   . Rash 04/17/2014  . Renal calculus 05/29/2010   CT scan of abdomen/pelvis on 05/29/2010 showed an obstructing approximate 1-2 mm calculus at the left UVJ, and an approximate 1-2 mm left lower pole renal calculus.   Patient had continuing severe pain , and an elevation of his serum creatinine to a value of 1.75 on 06/06/2010.  The stone had apparently passed and was not seen on repeat CT 06/08/2010.  [name] Tooth pain 10/29/2017  . Urinary straining 11/02/2016   Review of Systems:    Denies nausea, vomiting, abdominal pain, diarrhea  Physical Exam:  Vitals:   03/10/18 1559  BP: (!) 152/88  Pulse: 98  Temp: 98.8 F (37.1 C)  TempSrc: Oral  SpO2: 99%  Weight: 285 lb 9.6 oz (129.5 kg)  Height: 6\' 1"  (1.854 m)   Physical Exam  Constitutional: He appears well-developed and well-nourished. No distress.  HENT:  Head: Normocephalic and atraumatic.  Eyes: Conjunctivae are normal.  Cardiovascular: Normal rate, regular rhythm and normal heart sounds.  Respiratory: Effort normal and breath sounds normal. No respiratory distress. He has no wheezes.  GI: Soft. Bowel sounds are normal. He exhibits mass (3-4 inches in diabetes, on right side of inguinal incision for prostatectomy. Is soft and easily reducible. ). He exhibits no distension. There is no tenderness.  Musculoskeletal: He exhibits no edema.  Skin: He is not  diaphoretic.  Psychiatric: He has a normal mood and affect. His behavior is normal. Judgment and thought content normal.    Assessment & Plan:   See Encounters Tab for problem based charting.  Patient discussed with Dr. Beryle Beams

## 2018-03-10 NOTE — Assessment & Plan Note (Signed)
Patient states that he has been noticing an abdominal protrusion in the past 1.5week, but he states that it may have been present since his prostatectomy in May 2019.  The protrusion is non-tender, soft, reduces when pushed in, 3-4 inches in diameter present in umbilical area. He has not noticed any inciting factors that bring on the protrusion.   Denies nausea, vomiting, or diarrhea. Has not carried any heavy objects. Has been having to contract abdominal muscles to keep hold of his bladder.   Assessment and plan  The patient likely has an abdominal hernia post his prostatectomy procedure that is without gangrene or obstruction. There is no urgent need to do surgery at this time and will monitor patient. Patient was counseled on how to decrease intraabdominal pressure that will worsen the abdominal hernia. Suggested he get an abdominal binder to help reduce the hernia. The patient should also follow up with urology regarding his urinary incontinence so that he can decrease the strain that he is placing on his abdominal muscles.

## 2018-03-10 NOTE — Patient Instructions (Signed)
It was a pleasure to see you today [name]. Your abdominal protrusion is likely an abdominal hernia. You can buy an abdominal binder at the medical supply store to help reduce it. You should also be careful when you sit up and support your stomach as we showed you.   -please follow up with urology regarding your urinary incontinence  If you have any questions or concerns, please call our clinic at [phone] between 9am-5pm and after hours call [phone] and ask for the internal medicine resident on call. If you feel you are having a medical emergency please call 911.   Thank you, we look forward to help you remain healthy!  [name], MD Internal Medicine PGY2

## 2018-03-10 NOTE — Progress Notes (Signed)
Medicine attending: I personally interviewed and briefly examined this patient on the day of the patient visit and reviewed pertinent clinical laboratorydata  with resident physician Dr. [name] and we discussed a management plan. This patient had a prostatectomy for cancer in May; subsequently developed a bowel obstruction from adhesions. Veritcal midline scar below ombelicus about 6 cm long. He has developed an asymptomatic ventral hernia. He was advised on techniques to minimize chance of strangulation; use a corset; hold area when he goes to sit up from supine position; call for pain or if he feels a hard knot that he cannot reduce.

## 2018-03-15 ENCOUNTER — Telehealth: Payer: Self-pay | Admitting: *Deleted

## 2018-03-15 NOTE — Telephone Encounter (Signed)
SPOKE WITH OFFICE REGARDING STATUS OF THIS REFERRAL. OFFICE TO CONTACT PATIENT AS SOON SHE GETS OFF PHONE.

## 2018-03-17 ENCOUNTER — Ambulatory Visit: Payer: Medicaid Other | Attending: Hematology | Admitting: Physical Therapy

## 2018-03-17 ENCOUNTER — Encounter: Payer: Self-pay | Admitting: Physical Therapy

## 2018-03-17 DIAGNOSIS — R2689 Other abnormalities of gait and mobility: Secondary | ICD-10-CM | POA: Diagnosis present

## 2018-03-17 DIAGNOSIS — M6281 Muscle weakness (generalized): Secondary | ICD-10-CM

## 2018-03-17 NOTE — Therapy (Signed)
[facility] [address] [place], [place], [address] Phone: [phone]   Fax:  [phone]  Physical Therapy Evaluation  Patient Details  Name: Terry Parks MRN: [id number] Date of Birth: Jun 23, 1955 Referring Provider (PT): [name], MD resident, Murriel Hopper, MD   Encounter Date: 03/17/2018  PT End of Session - 03/17/18 1033    Visit Number  1    Number of Visits  8    Date for PT Re-Evaluation  05/12/18    Authorization Type  MCD pending, likely only 3 visits to start so one time a week for 3 weeks then reassess short term goals and resubmit for more visits    PT Start Time  0805    PT Stop Time  0850    PT Time Calculation (min)  45 min    Activity Tolerance  Patient tolerated treatment well;Patient limited by pain   feet and back pain by end of session   Behavior During Therapy  [facility] for tasks assessed/performed       Past Medical History:  Diagnosis Date  . Abscessed tooth    top back large cavity no pain or drainage, one on bottom  pt pulled tooth 4-5 months ago, right top large hole in tooth  . AKI (acute kidney injury) ([place])   . Allergic rhinitis   . Atrial fibrillation ([place])   . BPH (benign prostatic hypertrophy)    Massive BPH noted on cystoscopy 1/23/ 2012 by Dr. Risa Grill.  . Cardiomyopathy ([place])   . CHF (congestive heart failure) ([place])   . Cough 03/30/2012  . Depression   . Diabetes mellitus 04/08/2008   type 2  . Foley catheter in place 07-05-17 placed  . Headache(784.0)    hx migraines none recent  . Hyperlipemia   . Hypertension   . Hypertensive cardiopathy 03/01/2006   2-D echocardiogram 02/01/2012 showed moderate LVH, mildly to moderately reduced left ventricular systolic function with an estimated ejection fraction of 40-45%, and diffuse hypokinesis.  A nuclear medicine stress study done 01/31/2012 showed no reversible ischemia, a small mid anterior wall fixed defect/infarct, and  ejection fraction 42%.      . Neck pain   . Nephrolithiasis 05/29/2010   CT scan of abdomen/pelvis on 05/29/2010 showed an obstructing approximate 1-2 mm calculus at the left UVJ, and an approximate 1-2 mm left lower pole renal calculus.   Patient had continuing severe pain , and an elevation of his serum creatinine to a value of 1.75 on 06/06/2010.  Patient underwent cystoscopy on 06/08/2010 by Dr. Risa Grill, but attempts at retrograde pyelogram and ureteroscopy were unsucc  . Numbness 01/08/2018  . Obstructive sleep apnea 03/06/2008   Sleep study 03/06/08 showed severe OSA/hypopnea syndrome, with successful CPAP titration to 13 CWP using a medium ResMed Mirage Quattro full face mask with heated humidifier.   . Rash 04/17/2014  . Renal calculus 05/29/2010   CT scan of abdomen/pelvis on 05/29/2010 showed an obstructing approximate 1-2 mm calculus at the left UVJ, and an approximate 1-2 mm left lower pole renal calculus.   Patient had continuing severe pain , and an elevation of his serum creatinine to a value of 1.75 on 06/06/2010.  The stone had apparently passed and was not seen on repeat CT 06/08/2010.  [name] Tooth pain 10/29/2017  . Urinary straining 11/02/2016    Past Surgical History:  Procedure Laterality Date  . big toe nails removed Bilateral 20 yrs ago  . CARDIOVERSION N/A 06/08/2017  Procedure: CARDIOVERSION;  Surgeon: Josue Hector, MD;  Location: [facility] ENDOSCOPY;  Service: Cardiovascular;  Laterality: N/A;  . CYSTOSCOPY W/ RETROGRADES    . IR RADIOLOGIST EVAL & MGMT  02/02/2018  . LYMPHADENECTOMY Bilateral 10/06/2017   Procedure: LYMPHADENECTOMY;  Surgeon: Lucas Mallow, MD;  Location: [facility] ORS;  Service: Urology;  Laterality: Bilateral;  . ROBOT ASSISTED LAPAROSCOPIC RADICAL PROSTATECTOMY N/A 10/06/2017   Procedure: XI ROBOTIC ASSISTED LAPAROSCOPIC RADICAL PROSTATECTOMY;  Surgeon: Lucas Mallow, MD;  Location: [facility] ORS;  Service: Urology;  Laterality: N/A;  . TRANSURETHRAL RESECTION OF BLADDER  TUMOR N/A 07/13/2017   Procedure: TRANSURETHRAL RESECTION OF PROSTATE;  Surgeon: Lucas Mallow, MD;  Location: [facility] ORS;  Service: Urology;  Laterality: N/A;    There were no vitals filed for this visit.   Subjective Assessment - 03/17/18 0915    Subjective  Pt relays his biggest complaint is leg pain and neuropathy. He has had recent hospitilization and is S/P prostateectomy in May 2019 which then led to hernia. He was referred to PT for deconditioning. He relays his Rt leg is weaker and feels like it will give out on him sometimes. He denies any falls but does report he stumbles on occasion    Pertinent History  PMH: DM,HTN,CHF,CKDIII,prostate CA S/P prostateecomy in may 2019    Limitations  Standing;Walking    How long can you stand comfortably?  1 hour before leg/feet pain    How long can you walk comfortably?  grocery store distance    Patient Stated Goals  get the pain out of my legs/feet    Currently in Pain?  Yes    Pain Score  7     Pain Location  Leg    Pain Orientation  Right;Left;Lower    Pain Descriptors / Indicators  Numbness;Burning;Tingling;Shooting    Pain Type  Chronic pain    Pain Radiating Towards  calves and feet    Pain Onset  More than a month ago    Pain Frequency  Constant    Aggravating Factors   worse at night, prolonged activity    Pain Relieving Factors  massage, soaking his feet         [facility] PT Assessment - 03/17/18 0001      Assessment   Medical Diagnosis  deconditioning    Referring Provider (PT)  [name], MD resident, Murriel Hopper, MD    Next MD Visit  not scheduled    Prior Therapy  none      Precautions   Precautions  None      Balance Screen   Has the patient fallen in the past 6 months  No      [place] residence    Additional Comments  second floor apt, 26 stairs total with HR on Lt going up, has to perform step to pattern going down      Prior Function   Level of Independence   Independent      Cognition   Overall Cognitive Status  Within Functional Limits for tasks assessed      Sensation   Additional Comments  pt reports decreased sensation in calve and feet      ROM / Strength   AROM / PROM / Strength  AROM;Strength      AROM   Overall AROM Comments  lumbar AROM WFL about 75% ROM all planes      Strength   Overall Strength  Comments  LE strength grossly 4+/5, Rt leg slightly weaker than Lt      Ambulation/Gait   Gait velocity  3.97 ft/sec, took 8.25 sec for 10 M walk test      Balance   Balance Assessed  Yes      Standardized Balance Assessment   Standardized Balance Assessment  Dynamic Gait Index;Timed Up and Go Test;Five Times Sit to Stand    Five times sit to stand comments   19      Dynamic Gait Index   Level Surface  Normal    Change in Gait Speed  Mild Impairment    Gait with Horizontal Head Turns  Normal    Gait with Vertical Head Turns  Normal    Gait and Pivot Turn  Normal    Step Over Obstacle  Mild Impairment    Step Around Obstacles  Normal    Steps  Mild Impairment    Total Score  21      Timed Up and Go Test   TUG  Normal TUG    Normal TUG (seconds)  13      High Level Balance   High Level Balance Comments  tandem stance hold for 20 sec avg, SLS hold for 3 second avg                Objective measurements completed on examination: See above findings.      [facility] Adult PT Treatment/Exercise - 03/17/18 0001      Ambulation/Gait   Gait Comments  slightly slower speed, slightly wider BOS, decreased arm swing      Exercises   Exercises  Ankle      Modalities   Modalities  Moist Heat      Moist Heat Therapy   Number Minutes Moist Heat  10 Minutes   with HEP review   Moist Heat Location  Lumbar Spine      Ankle Exercises: Stretches   Soleus Stretch  30 seconds;2 reps   off step   Gastroc Stretch  30 seconds;3 reps   off step     Ankle Exercises: Standing   Heel Raises  10 reps    Toe Raise  10 reps              PT Education - 03/17/18 0919    Education Details  HEP, POC    Person(s) Educated  Patient    Methods  Explanation;Demonstration;Verbal cues;Handout    Comprehension  Verbalized understanding;Returned demonstration;Need further instruction       PT Short Term Goals - 03/17/18 1043      PT SHORT TERM GOAL #1   Title  Pt will be I and compliant with HEP. 3 weeks 04/07/18    Baseline  no HEP until today    Status  New      PT SHORT TERM GOAL #2   Title  Pt will improve 5TSTS test to less than 17 sec to show improved endurance. 3 weeks 04/07/18    Baseline  19 sec    Status  New        PT Long Term Goals - 03/17/18 1048      PT LONG TERM GOAL #1   Title  Pt will improve 5TSTS to less 14 sec to show improved endurance. 8 weeks 05/12/18    Baseline  19 sec    Status  New      PT LONG TERM GOAL #2   Title  Pt will improve  gait speed to at least 4.67ft/sec. 8 weeks 05/12/18    Baseline  3.9 ft/sec    Status  New      PT LONG TERM GOAL #3   Title  Pt will be able to hold SLS at least 6 sec on avg to show improved balance. 8 weeks 05/12/18    Baseline  3 sec avg    Status  New      PT LONG TERM GOAL #4   Title  Pt will be able to walk up/down stairs 26 reciprocally using one handrail. 8 weeks 05/12/18    Baseline  has to do stairs one at a time in step to pattern due to Rt leg weakness.    Status  New             Plan - 03/17/18 1035    Clinical Impression Statement  Pt referred to PT for deconditioning. He does have decreased muscular endurance and strength in his LE's, Rt is weaker than left along with minor balance deficits and minor decreased gait velocity. His biggest complaint is his neuropathy and vascular issues to his feet and lower legs. He was informed that PT may not be able to help with his neuropathy but can help him improve his endurace and strength, and balance.  He was given HEP for strength, balance, endurance, and LE stretching  program to begin. He will benefit from skilled PT to address his deficits.     History and Personal Factors relevant to plan of care:  PMH: DM,HTN,CHF,CKDIII,prostate CA S/P prostateecomy in may 2019    Clinical Presentation  Evolving    Clinical Presentation due to:  worsening pain and Rt leg weakness/fatigue    Clinical Decision Making  Moderate    Rehab Potential  Good    PT Frequency  1x / week    PT Duration  8 weeks    PT Treatment/Interventions  Electrical Stimulation;Moist Heat;Gait training;Stair training;Therapeutic activities;Therapeutic exercise;Balance training;Neuromuscular re-education;Manual techniques    PT Next Visit Plan  review HEP, work on endurance, LE strength, and balance    PT Home Exercise Plan  [id number], (standing gastroc and soleus stretch, standing heel toe raises, SLS near something he can grab, sit to stands, self massage to calf,feet with tennis ball)    Consulted and Agree with Plan of Care  Patient       Patient will benefit from skilled therapeutic intervention in order to improve the following deficits and impairments:  Abnormal gait, Decreased activity tolerance, Decreased endurance, Decreased strength, Pain, Postural dysfunction, Difficulty walking, Decreased balance  Visit Diagnosis: Muscle weakness (generalized)  Other abnormalities of gait and mobility     Problem List Patient Active Problem List   Diagnosis Date Noted  . Abdominal hernia 03/10/2018  . Stage 3 chronic kidney disease ([place]) 02/11/2018  . Healthcare maintenance 02/11/2018  . Physical deconditioning 02/11/2018  . Microcytic anemia 02/11/2018  . Pelvic abscess in male [facility]) 01/14/2018  . SBO (small bowel obstruction) ([place]) 01/13/2018  . Stress incontinence of urine 01/06/2018  . Candidiasis of perineum 01/05/2018  . Pain due to dental caries 11/09/2017  . Prostate cancer ([place]) 10/06/2017  . Bladder mass 07/13/2017  . Persistent atrial fibrillation   . Vitamin D deficiency  06/22/2016  . Neuropathy in diabetes ([place]) 10/07/2015  . Erectile dysfunction 04/17/2014  . Allergic rhinitis 10/19/2012  . Diabetic retinopathy ([place]) 10/04/2012  . BPH   . Type 2 diabetes mellitus with peripheral neuropathy ([place]) 04/08/2008  .  Obstructive sleep apnea   . Morbid obesity ([place])   . Hyperlipidemia associated with type 2 diabetes mellitus ([place])   . Essential hypertension   . Chronic combined systolic and diastolic CHF (congestive heart failure) ([place]) 03/01/2006    Debbe Odea, PT, DPT 03/17/2018, 10:55 AM  [facility] [address] [place], [place], [address] Phone: [phone]   Fax:  [phone]  Name: Udell Mazzocco MRN: [id number] Date of Birth: Mar 30, 1956

## 2018-03-21 ENCOUNTER — Telehealth: Payer: Self-pay | Admitting: *Deleted

## 2018-03-21 NOTE — Telephone Encounter (Signed)
SPOKE WITH BRIANNA REGARDING THE STATUS OF THIS REFERRAL. PER BRIANNA, PATIENT NO SHOWED FOR PODIATRY APPOINTMENT, THERE FORE PATIENT IS UNABLE TO BE REFERRED FOR ANY SPECIALTY SERVICES UNTIL December 3.2019.

## 2018-03-28 ENCOUNTER — Encounter: Payer: Self-pay | Admitting: Physical Therapy

## 2018-03-28 ENCOUNTER — Ambulatory Visit: Payer: Medicaid Other | Admitting: Physical Therapy

## 2018-03-28 DIAGNOSIS — M6281 Muscle weakness (generalized): Secondary | ICD-10-CM

## 2018-03-28 DIAGNOSIS — R2689 Other abnormalities of gait and mobility: Secondary | ICD-10-CM

## 2018-03-28 NOTE — Patient Instructions (Signed)
Access Code: [email or web address]  URL: [email or web address]  Date: 03/28/2018  Prepared by: Floreen Comber   Exercises  Standing Gastroc Stretch at [employer] - 3 sets - 30 hold - 1x daily - 7x weekly  Heel Toe Raises with Counter Support - 10 reps - 3 sets - 1x daily - 7x weekly  Standing Single Leg Stance with Unilateral Counter Support - 3 sets - 20 hold - 1x daily - 7x weekly  Supine March with Alternating Leg Lifts - 10 reps - 3 sets - 1x daily - 7x weekly  Hooklying Isometric Hip Flexion - 10 reps - 3 sets - 3 hold - 1x daily - 7x weekly  Supine Piriformis Stretch with Leg Straight - 3 sets - 30 hold - 1x daily - 7x weekly  Supine Bridge - 10 reps - 3 sets - 3 hold - 1x daily - 7x weekly  Single Leg Sit to Stand with Arms Crossed - 10 reps - 3 sets - 1x daily - 7x weekly

## 2018-03-28 NOTE — Therapy (Signed)
[facility] [address] [place], [place], [address] Phone: [phone]   Fax:  [phone]  Physical Therapy Treatment  Patient Details  Name: Terry Parks MRN: [id number] Date of Birth: Apr 17, 1956 Referring Provider (PT): [name], MD resident, Murriel Hopper, MD   Encounter Date: 03/28/2018  PT End of Session - 03/28/18 1625    Visit Number  2    Number of Visits  8    Date for PT Re-Evaluation  05/12/18    Authorization Type  MCD pending, likely only 3 visits to start so one time a week for 3 weeks then reassess short term goals and resubmit for more visits    PT Start Time  1534    PT Stop Time  1615    PT Time Calculation (min)  41 min    Activity Tolerance  Patient tolerated treatment well    Behavior During Therapy  [facility] for tasks assessed/performed       Past Medical History:  Diagnosis Date  . Abscessed tooth    top back large cavity no pain or drainage, one on bottom  pt pulled tooth 4-5 months ago, right top large hole in tooth  . AKI (acute kidney injury) ([place])   . Allergic rhinitis   . Atrial fibrillation ([place])   . BPH (benign prostatic hypertrophy)    Massive BPH noted on cystoscopy 1/23/ 2012 by Dr. Risa Grill.  . Cardiomyopathy ([place])   . CHF (congestive heart failure) ([place])   . Cough 03/30/2012  . Depression   . Diabetes mellitus 04/08/2008   type 2  . Foley catheter in place 07-05-17 placed  . Headache(784.0)    hx migraines none recent  . Hyperlipemia   . Hypertension   . Hypertensive cardiopathy 03/01/2006   2-D echocardiogram 02/01/2012 showed moderate LVH, mildly to moderately reduced left ventricular systolic function with an estimated ejection fraction of 40-45%, and diffuse hypokinesis.  A nuclear medicine stress study done 01/31/2012 showed no reversible ischemia, a small mid anterior wall fixed defect/infarct, and ejection fraction 42%.      . Neck pain   . Nephrolithiasis  05/29/2010   CT scan of abdomen/pelvis on 05/29/2010 showed an obstructing approximate 1-2 mm calculus at the left UVJ, and an approximate 1-2 mm left lower pole renal calculus.   Patient had continuing severe pain , and an elevation of his serum creatinine to a value of 1.75 on 06/06/2010.  Patient underwent cystoscopy on 06/08/2010 by Dr. Risa Grill, but attempts at retrograde pyelogram and ureteroscopy were unsucc  . Numbness 01/08/2018  . Obstructive sleep apnea 03/06/2008   Sleep study 03/06/08 showed severe OSA/hypopnea syndrome, with successful CPAP titration to 13 CWP using a medium ResMed Mirage Quattro full face mask with heated humidifier.   . Rash 04/17/2014  . Renal calculus 05/29/2010   CT scan of abdomen/pelvis on 05/29/2010 showed an obstructing approximate 1-2 mm calculus at the left UVJ, and an approximate 1-2 mm left lower pole renal calculus.   Patient had continuing severe pain , and an elevation of his serum creatinine to a value of 1.75 on 06/06/2010.  The stone had apparently passed and was not seen on repeat CT 06/08/2010.  [name] Tooth pain 10/29/2017  . Urinary straining 11/02/2016    Past Surgical History:  Procedure Laterality Date  . big toe nails removed Bilateral 20 yrs ago  . CARDIOVERSION N/A 06/08/2017   Procedure: CARDIOVERSION;  Surgeon: Josue Hector, MD;  Location: [facility] ENDOSCOPY;  Service: Cardiovascular;  Laterality: N/A;  . CYSTOSCOPY W/ RETROGRADES    . IR RADIOLOGIST EVAL & MGMT  02/02/2018  . LYMPHADENECTOMY Bilateral 10/06/2017   Procedure: LYMPHADENECTOMY;  Surgeon: Lucas Mallow, MD;  Location: [facility] ORS;  Service: Urology;  Laterality: Bilateral;  . ROBOT ASSISTED LAPAROSCOPIC RADICAL PROSTATECTOMY N/A 10/06/2017   Procedure: XI ROBOTIC ASSISTED LAPAROSCOPIC RADICAL PROSTATECTOMY;  Surgeon: Lucas Mallow, MD;  Location: [facility] ORS;  Service: Urology;  Laterality: N/A;  . TRANSURETHRAL RESECTION OF BLADDER TUMOR N/A 07/13/2017   Procedure: TRANSURETHRAL RESECTION OF  PROSTATE;  Surgeon: Lucas Mallow, MD;  Location: [facility] ORS;  Service: Urology;  Laterality: N/A;    There were no vitals filed for this visit.  Subjective Assessment - 03/28/18 1533    Subjective  Reports everything is going well with his HEP. Experiences LOB when performing heel toe exercise. Reports feeling better after having a heat pack at last session.     Pertinent History  PMH: DM,HTN,CHF,CKDIII,prostate CA S/P prostateecomy in may 2019    Limitations  Standing;Walking    How long can you stand comfortably?  1 hour before leg/feet pain    How long can you walk comfortably?  grocery store distance    Patient Stated Goals  get the pain out of my legs/feet    Currently in Pain?  No/denies         Skilled Physical Therapy Intervention:  Patient verbalizes and demonstrates understanding of the below updated HEP exercises to perform at home to improve safety and independence with functional mobility.   Exercises  Standing Gastroc Stretch at [employer] - 3 sets - 30 hold - 1x daily - 7x weekly  Heel Toe Raises with Counter Support - 10 reps - 3 sets - 1x daily - 7x weekly  Supine March with Alternating Leg Lifts - 10 reps - 3 sets - 1x daily - 7x weekly  Hooklying Isometric Hip Flexion - 10 reps - 3 sets - 3 hold - 1x daily - 7x weekly  Supine Piriformis Stretch with Leg Straight - 3 sets - 30 hold - 1x daily - 7x weekly  Supine Bridge - 10 reps - 3 sets - 3 hold - 1x daily - 7x weekly  Single Leg Sit to Stand with Arms Crossed - 10 reps - 3 sets - 1x daily - 7x weekly     [facility] Adult PT Treatment/Exercise - 03/28/18 1623      Moist Heat Therapy   Number Minutes Moist Heat  17 Minutes    Moist Heat Location  Lumbar Spine         PT Education - 03/28/18 1624    Education Details  Therapist provided patient education regarding updated HEP exercises with patient verbalizing and demonstrating understanding with proper technique.     Person(s) Educated  Patient    Methods   Explanation    Comprehension  Verbalized understanding;Returned demonstration       PT Short Term Goals - 03/17/18 1043      PT SHORT TERM GOAL #1   Title  Pt will be I and compliant with HEP. 3 weeks 04/07/18    Baseline  no HEP until today    Status  New      PT SHORT TERM GOAL #2   Title  Pt will improve 5TSTS test to less than 17 sec to show improved endurance. 3 weeks 04/07/18    Baseline  19 sec    Status  New        PT Long Term Goals - 03/17/18 1048      PT LONG TERM GOAL #1   Title  Pt will improve 5TSTS to less 14 sec to show improved endurance. 8 weeks 05/12/18    Baseline  19 sec    Status  New      PT LONG TERM GOAL #2   Title  Pt will improve gait speed to at least 4.1ft/sec. 8 weeks 05/12/18    Baseline  3.9 ft/sec    Status  New      PT LONG TERM GOAL #3   Title  Pt will be able to hold SLS at least 6 sec on avg to show improved balance. 8 weeks 05/12/18    Baseline  3 sec avg    Status  New      PT LONG TERM GOAL #4   Title  Pt will be able to walk up/down stairs 26 reciprocally using one handrail. 8 weeks 05/12/18    Baseline  has to do stairs one at a time in step to pattern due to Rt leg weakness.    Status  New            Plan - 03/28/18 1626    Clinical Impression Statement  Skilled session focused on initiating core stabilization and updating LE strengthening to HEP to continue progressing towards improved functional mobility. Pt tolerated session well and performed all supine HEP exercises with heat pack applied to lower back 2/2 to patient expressing c/o tightness. Pt will continue to benefit from skilled PT to address balance, strength, and functional mobility deficits.     Rehab Potential  Good    PT Frequency  1x / week    PT Duration  8 weeks    PT Treatment/Interventions  Electrical Stimulation;Moist Heat;Gait training;Stair training;Therapeutic activities;Therapeutic exercise;Balance training;Neuromuscular re-education;Manual  techniques    PT Next Visit Plan  review HEP, work on endurance, LE strength, and balance    PT Home Exercise Plan  [email or web address] (self massage to calf, feet with tennis ball)    Consulted and Agree with Plan of Care  Patient       Patient will benefit from skilled therapeutic intervention in order to improve the following deficits and impairments:  Abnormal gait, Decreased activity tolerance, Decreased endurance, Decreased strength, Pain, Postural dysfunction, Difficulty walking, Decreased balance  Visit Diagnosis: Muscle weakness (generalized)  Other abnormalities of gait and mobility     Problem List Patient Active Problem List   Diagnosis Date Noted  . Abdominal hernia 03/10/2018  . Stage 3 chronic kidney disease ([place]) 02/11/2018  . Healthcare maintenance 02/11/2018  . Physical deconditioning 02/11/2018  . Microcytic anemia 02/11/2018  . Pelvic abscess in male [facility]) 01/14/2018  . SBO (small bowel obstruction) ([place]) 01/13/2018  . Stress incontinence of urine 01/06/2018  . Candidiasis of perineum 01/05/2018  . Pain due to dental caries 11/09/2017  . Prostate cancer ([place]) 10/06/2017  . Bladder mass 07/13/2017  . Persistent atrial fibrillation   . Vitamin D deficiency 06/22/2016  . Neuropathy in diabetes ([place]) 10/07/2015  . Erectile dysfunction 04/17/2014  . Allergic rhinitis 10/19/2012  . Diabetic retinopathy ([place]) 10/04/2012  . BPH   . Type 2 diabetes mellitus with peripheral neuropathy ([place]) 04/08/2008  . Obstructive sleep apnea   . Morbid obesity ([place])   . Hyperlipidemia associated with type 2 diabetes mellitus ([place])   . Essential hypertension   . Chronic combined systolic and diastolic  CHF (congestive heart failure) ([place]) 03/01/2006    Floreen Comber, SPT 03/28/2018, 4:30 PM  [facility] [address] [place], [place], [address] Phone: [phone]   Fax:  [phone]  Name: Meade Hogeland MRN:  [id number] Date of Birth: 1955/08/24

## 2018-03-30 ENCOUNTER — Encounter: Payer: Self-pay | Admitting: Gastroenterology

## 2018-03-31 ENCOUNTER — Ambulatory Visit (INDEPENDENT_AMBULATORY_CARE_PROVIDER_SITE_OTHER): Payer: Self-pay | Admitting: Dietician

## 2018-03-31 ENCOUNTER — Other Ambulatory Visit: Payer: Self-pay | Admitting: Internal Medicine

## 2018-03-31 ENCOUNTER — Encounter: Payer: Self-pay | Admitting: Dietician

## 2018-03-31 ENCOUNTER — Other Ambulatory Visit: Payer: Self-pay | Admitting: Dietician

## 2018-03-31 DIAGNOSIS — E1142 Type 2 diabetes mellitus with diabetic polyneuropathy: Secondary | ICD-10-CM

## 2018-03-31 DIAGNOSIS — Z794 Long term (current) use of insulin: Secondary | ICD-10-CM

## 2018-03-31 DIAGNOSIS — Z713 Dietary counseling and surveillance: Secondary | ICD-10-CM

## 2018-03-31 MED ORDER — BASAGLAR KWIKPEN 100 UNIT/ML ~~LOC~~ SOPN: PEN_INJECTOR | SUBCUTANEOUS | 12 refills | Status: DC

## 2018-03-31 NOTE — Telephone Encounter (Signed)
Dr. Maricela Bo- can you resend the basaglar to [facility] please? Thank you!

## 2018-03-31 NOTE — Telephone Encounter (Signed)
I think that went to [facility] outpatient pharmacy. Can you please send it to [facility]

## 2018-03-31 NOTE — Addendum Note (Signed)
Addended by: Resa Miner on: 03/31/2018 01:32 PM   Modules accepted: Orders

## 2018-03-31 NOTE — Telephone Encounter (Signed)
Sent to [email or web address]

## 2018-03-31 NOTE — Progress Notes (Signed)
Diabetes Self-Management Education  Visit Type: Follow-up  Appt. Start Time: 1035 Appt. End Time:1120  03/31/2018  Mr. [name], identified by name and date of birth, is a 62 y.o. male with a diagnosis of Diabetes:  Type 2   ASSESSMENT  Mr. Pagliuca is doing well. He reports not being able to use his Cpap due to dental pain. He is almost out of insulin. Has not gotten any of [facility]. We do not have ay samples today of lantus.   Diabetes Self-Management Education - 03/31/18 1100      Visit Information   Visit Type  Follow-up      Complications   How often do you check your blood sugar?  1-2 times/day    Fasting Blood glucose range (mg/dL)  130-179;180-200    Number of hypoglycemic episodes per month  0    Number of hyperglycemic episodes per week  0    Have you had a dental exam in the past 12 months?  No   says he cannot wear his CPAP due to dental pain     Subsequent Visit   Since your last visit have you continued or begun to take your medications as prescribed?  Yes   misses PM dose of metformin two times a week   Since your last visit have you had your blood pressure checked?  No    Since your last visit have you experienced any weight changes?  --   did not want to weigh because he thinks he is gaining wt   Since your last visit, are you checking your blood glucose at least once a day?  Yes       Individualized Plan for Diabetes Self-Management Training:   Learning Objective:  Patient will have a greater understanding of diabetes self-management. Patient education plan is to attend individual and/or group sessions per assessed needs and concerns.   Plan:   Patient Instructions  To Do list-   1- get refill on lantus or basaglar- I will follow up to be sure you can get some at either [facility] or [facility].  2- Call DENTIST As soon as possible  3- Fill out application for rides with [facility] [phone]  Expected  Outcomes:    Education material provided: avs and print outs from subway, grocery If problems or questions, patient to contact team via:  Phone Future DSME appointment:    Debera Lat, RD 03/31/2018 11:32 AM.

## 2018-03-31 NOTE — Patient Instructions (Addendum)
To Do list-   1- get refill on lantus or basaglar- I will follow up to be sure you can get some at either [facility] or [facility].  2- Call DENTIST As soon as possible  3- Fill out application for rides with [employer] [phone]

## 2018-03-31 NOTE — Telephone Encounter (Signed)
Needs a new prescription of basaglar sent to [facility]. He told me today he is taking 50 units a day of lantus which basalgar is a generic of.

## 2018-03-31 NOTE — Progress Notes (Signed)
Sending [facility]

## 2018-04-04 ENCOUNTER — Ambulatory Visit: Payer: Medicaid Other

## 2018-04-04 ENCOUNTER — Telehealth: Payer: Self-pay

## 2018-04-04 DIAGNOSIS — M6281 Muscle weakness (generalized): Secondary | ICD-10-CM | POA: Diagnosis not present

## 2018-04-04 DIAGNOSIS — R2689 Other abnormalities of gait and mobility: Secondary | ICD-10-CM

## 2018-04-04 NOTE — Therapy (Signed)
[facility] [address] [place], [place], [address] Phone: [phone]   Fax:  [phone]  Physical Therapy Treatment  Patient Details  Name: Stevenson Windmiller MRN: [id number] Date of Birth: 09-04-1955 Referring Provider (PT): [name], MD resident, Murriel Hopper, MD   Encounter Date: 04/04/2018  PT End of Session - 04/04/18 1408    Visit Number  3    Number of Visits  8    Date for PT Re-Evaluation  05/12/18    Authorization Type  MCD pending, likely only 3 visits to start so one time a week for 3 weeks then reassess short term goals and resubmit for more visits    PT Start Time  1400    PT Stop Time  1445    PT Time Calculation (min)  45 min    Activity Tolerance  Patient tolerated treatment well    Behavior During Therapy  [facility] for tasks assessed/performed       Past Medical History:  Diagnosis Date  . Abscessed tooth    top back large cavity no pain or drainage, one on bottom  pt pulled tooth 4-5 months ago, right top large hole in tooth  . AKI (acute kidney injury) ([place])   . Allergic rhinitis   . Atrial fibrillation ([place])   . BPH (benign prostatic hypertrophy)    Massive BPH noted on cystoscopy 1/23/ 2012 by Dr. Risa Grill.  . Cardiomyopathy ([place])   . CHF (congestive heart failure) ([place])   . Cough 03/30/2012  . Depression   . Diabetes mellitus 04/08/2008   type 2  . Foley catheter in place 07-05-17 placed  . Headache(784.0)    hx migraines none recent  . Hyperlipemia   . Hypertension   . Hypertensive cardiopathy 03/01/2006   2-D echocardiogram 02/01/2012 showed moderate LVH, mildly to moderately reduced left ventricular systolic function with an estimated ejection fraction of 40-45%, and diffuse hypokinesis.  A nuclear medicine stress study done 01/31/2012 showed no reversible ischemia, a small mid anterior wall fixed defect/infarct, and ejection fraction 42%.      . Neck pain   . Nephrolithiasis  05/29/2010   CT scan of abdomen/pelvis on 05/29/2010 showed an obstructing approximate 1-2 mm calculus at the left UVJ, and an approximate 1-2 mm left lower pole renal calculus.   Patient had continuing severe pain , and an elevation of his serum creatinine to a value of 1.75 on 06/06/2010.  Patient underwent cystoscopy on 06/08/2010 by Dr. Risa Grill, but attempts at retrograde pyelogram and ureteroscopy were unsucc  . Numbness 01/08/2018  . Obstructive sleep apnea 03/06/2008   Sleep study 03/06/08 showed severe OSA/hypopnea syndrome, with successful CPAP titration to 13 CWP using a medium ResMed Mirage Quattro full face mask with heated humidifier.   . Rash 04/17/2014  . Renal calculus 05/29/2010   CT scan of abdomen/pelvis on 05/29/2010 showed an obstructing approximate 1-2 mm calculus at the left UVJ, and an approximate 1-2 mm left lower pole renal calculus.   Patient had continuing severe pain , and an elevation of his serum creatinine to a value of 1.75 on 06/06/2010.  The stone had apparently passed and was not seen on repeat CT 06/08/2010.  [name] Tooth pain 10/29/2017  . Urinary straining 11/02/2016    Past Surgical History:  Procedure Laterality Date  . big toe nails removed Bilateral 20 yrs ago  . CARDIOVERSION N/A 06/08/2017   Procedure: CARDIOVERSION;  Surgeon: Josue Hector, MD;  Location: [facility] ENDOSCOPY;  Service: Cardiovascular;  Laterality: N/A;  . CYSTOSCOPY W/ RETROGRADES    . IR RADIOLOGIST EVAL & MGMT  02/02/2018  . LYMPHADENECTOMY Bilateral 10/06/2017   Procedure: LYMPHADENECTOMY;  Surgeon: Lucas Mallow, MD;  Location: [facility] ORS;  Service: Urology;  Laterality: Bilateral;  . ROBOT ASSISTED LAPAROSCOPIC RADICAL PROSTATECTOMY N/A 10/06/2017   Procedure: XI ROBOTIC ASSISTED LAPAROSCOPIC RADICAL PROSTATECTOMY;  Surgeon: Lucas Mallow, MD;  Location: [facility] ORS;  Service: Urology;  Laterality: N/A;  . TRANSURETHRAL RESECTION OF BLADDER TUMOR N/A 07/13/2017   Procedure: TRANSURETHRAL RESECTION OF  PROSTATE;  Surgeon: Lucas Mallow, MD;  Location: [facility] ORS;  Service: Urology;  Laterality: N/A;    There were no vitals filed for this visit.  Subjective Assessment - 04/04/18 1404    Subjective  Pt reports not feeling well today but showed up anyway. HEP is going well, sometimes he doesn't have enough energy to do them all. Pt has been feeling very fatigued lately.     Pertinent History  PMH: DM,HTN,CHF,CKDIII,prostate CA S/P prostateecomy in may 2019    Limitations  Standing;Walking    How long can you stand comfortably?  1 hour before leg/feet pain    How long can you walk comfortably?  grocery store distance    Patient Stated Goals  get the pain out of my legs/feet    Currently in Pain?  Yes    Pain Score  6     Pain Location  Back    Pain Orientation  Lower    Pain Descriptors / Indicators  Aching;Sore    Pain Type  Chronic pain    Pain Onset  More than a month ago    Pain Frequency  Constant    Aggravating Factors   activity    Pain Relieving Factors  Heat helps        [facility] Adult PT Treatment/Exercise - 04/04/18 1409      Ambulation/Gait   Ambulation/Gait  Yes    Ambulation/Gait Assistance  4: Min guard    Ambulation/Gait Assistance Details  scanning in hallway performing head nods/turns/diagonals, change in pace, sudden stops/turns with min/mod deviation from path during head movements, minimal instability.     Gait training with RLE Blue Rocker AFO with increased stability and foot clearance, pt states he would like to be able to ambulate with brace outside of therapy sessions.     Ambulation Distance (Feet)  750 Feet   207   Assistive device  None;Other (Comment)   R AFO   Ambulation Surface  Level;Indoor      Exercises   Exercises  Knee/Hip      Knee/Hip Exercises: Aerobic   Nustep  L4, 10 mins         PT Education - 04/04/18 2106    Education Details  Educated pt on purpose and benefits of RLE AFO to increase foot clearance as well as proper donning of  AFO.     Person(s) Educated  Patient    Methods  Explanation;Demonstration;Tactile cues;Verbal cues    Comprehension  Verbalized understanding;Returned demonstration;Verbal cues required;Tactile cues required;Need further instruction       PT Short Term Goals - 03/17/18 1043      PT SHORT TERM GOAL #1   Title  Pt will be I and compliant with HEP. 3 weeks 04/07/18    Baseline  no HEP until today    Status  New      PT SHORT TERM GOAL #2  Title  Pt will improve 5TSTS test to less than 17 sec to show improved endurance. 3 weeks 04/07/18    Baseline  19 sec    Status  New        PT Long Term Goals - 03/17/18 1048      PT LONG TERM GOAL #1   Title  Pt will improve 5TSTS to less 14 sec to show improved endurance. 8 weeks 05/12/18    Baseline  19 sec    Status  New      PT LONG TERM GOAL #2   Title  Pt will improve gait speed to at least 4.53ft/sec. 8 weeks 05/12/18    Baseline  3.9 ft/sec    Status  New      PT LONG TERM GOAL #3   Title  Pt will be able to hold SLS at least 6 sec on avg to show improved balance. 8 weeks 05/12/18    Baseline  3 sec avg    Status  New      PT LONG TERM GOAL #4   Title  Pt will be able to walk up/down stairs 26 reciprocally using one handrail. 8 weeks 05/12/18    Baseline  has to do stairs one at a time in step to pattern due to Rt leg weakness.    Status  New            Plan - 04/04/18 2108    Clinical Impression Statement  Todays skilled session focused on gait training with no AD/RLE blue rocker AFO with increase in foot clearance/stability noted during ambulation and use of Nustep to increase edurance/BLE strengthening. Pt states that he would like to be able to utilize AFO outside of therapy. Pt should benefit from continued PT sessions to progress toward goals.     Rehab Potential  Good    PT Frequency  1x / week    PT Duration  8 weeks    PT Treatment/Interventions  Electrical Stimulation;Moist Heat;Gait training;Stair  training;Therapeutic activities;Therapeutic exercise;Balance training;Neuromuscular re-education;Manual techniques    PT Next Visit Plan  Continue to work on endurance, LE strength, and balance, gait with RLE AFO    PT Home Exercise Plan  [email or web address] (self massage to calf, feet with tennis ball)    Consulted and Agree with Plan of Care  Patient       Patient will benefit from skilled therapeutic intervention in order to improve the following deficits and impairments:  Abnormal gait, Decreased activity tolerance, Decreased endurance, Decreased strength, Pain, Postural dysfunction, Difficulty walking, Decreased balance  Visit Diagnosis: Muscle weakness (generalized)  Other abnormalities of gait and mobility     Problem List Patient Active Problem List   Diagnosis Date Noted  . Abdominal hernia 03/10/2018  . Stage 3 chronic kidney disease ([place]) 02/11/2018  . Healthcare maintenance 02/11/2018  . Physical deconditioning 02/11/2018  . Microcytic anemia 02/11/2018  . Pelvic abscess in male [facility]) 01/14/2018  . SBO (small bowel obstruction) ([place]) 01/13/2018  . Stress incontinence of urine 01/06/2018  . Candidiasis of perineum 01/05/2018  . Pain due to dental caries 11/09/2017  . Prostate cancer ([place]) 10/06/2017  . Bladder mass 07/13/2017  . Persistent atrial fibrillation   . Vitamin D deficiency 06/22/2016  . Neuropathy in diabetes ([place]) 10/07/2015  . Erectile dysfunction 04/17/2014  . Allergic rhinitis 10/19/2012  . Diabetic retinopathy ([place]) 10/04/2012  . BPH   . Type 2 diabetes mellitus with peripheral neuropathy ([place]) 04/08/2008  . Obstructive sleep  apnea   . Morbid obesity ([place])   . Hyperlipidemia associated with type 2 diabetes mellitus ([place])   . Essential hypertension   . Chronic combined systolic and diastolic CHF (congestive heart failure) ([place]) 03/01/2006   Brinson Tozzi, PTA  Ieshia Hatcher A [place] 04/04/2018, 9:12 PM  [facility] [address] [place], [place], [address] Phone: [phone]   Fax:  [phone]  Name: Yonael Tulloch MRN: [id number] Date of Birth: [birth year]

## 2018-04-07 ENCOUNTER — Encounter: Payer: Self-pay | Admitting: Internal Medicine

## 2018-04-07 ENCOUNTER — Other Ambulatory Visit: Payer: Self-pay

## 2018-04-07 ENCOUNTER — Ambulatory Visit (INDEPENDENT_AMBULATORY_CARE_PROVIDER_SITE_OTHER): Payer: Self-pay | Admitting: Internal Medicine

## 2018-04-07 VITALS — BP 162/90 | HR 85 | Temp 97.8°F | Ht 73.0 in | Wt 293.6 lb

## 2018-04-07 DIAGNOSIS — R0601 Orthopnea: Secondary | ICD-10-CM

## 2018-04-07 DIAGNOSIS — I10 Essential (primary) hypertension: Secondary | ICD-10-CM

## 2018-04-07 DIAGNOSIS — I110 Hypertensive heart disease with heart failure: Secondary | ICD-10-CM

## 2018-04-07 DIAGNOSIS — E1142 Type 2 diabetes mellitus with diabetic polyneuropathy: Secondary | ICD-10-CM

## 2018-04-07 DIAGNOSIS — I4819 Other persistent atrial fibrillation: Secondary | ICD-10-CM

## 2018-04-07 DIAGNOSIS — E1169 Type 2 diabetes mellitus with other specified complication: Secondary | ICD-10-CM

## 2018-04-07 DIAGNOSIS — Z9079 Acquired absence of other genital organ(s): Secondary | ICD-10-CM

## 2018-04-07 DIAGNOSIS — Z8546 Personal history of malignant neoplasm of prostate: Secondary | ICD-10-CM

## 2018-04-07 DIAGNOSIS — K469 Unspecified abdominal hernia without obstruction or gangrene: Secondary | ICD-10-CM

## 2018-04-07 DIAGNOSIS — G4733 Obstructive sleep apnea (adult) (pediatric): Secondary | ICD-10-CM

## 2018-04-07 DIAGNOSIS — Z794 Long term (current) use of insulin: Secondary | ICD-10-CM

## 2018-04-07 DIAGNOSIS — E785 Hyperlipidemia, unspecified: Secondary | ICD-10-CM

## 2018-04-07 DIAGNOSIS — Z79899 Other long term (current) drug therapy: Secondary | ICD-10-CM

## 2018-04-07 DIAGNOSIS — Z7901 Long term (current) use of anticoagulants: Secondary | ICD-10-CM

## 2018-04-07 DIAGNOSIS — I5042 Chronic combined systolic (congestive) and diastolic (congestive) heart failure: Secondary | ICD-10-CM

## 2018-04-07 LAB — POCT GLYCOSYLATED HEMOGLOBIN (HGB A1C): Hemoglobin A1C: 8.5 % — AB (ref 4.0–5.6)

## 2018-04-07 LAB — GLUCOSE, CAPILLARY: Glucose-Capillary: 262 mg/dL — ABNORMAL HIGH (ref 70–99)

## 2018-04-07 MED ORDER — ATORVASTATIN CALCIUM 40 MG PO TABS: 40.0000 mg | ORAL_TABLET | Freq: Every day | ORAL | 0 refills | Status: DC

## 2018-04-07 MED ORDER — BASAGLAR KWIKPEN 100 UNIT/ML ~~LOC~~ SOPN: PEN_INJECTOR | SUBCUTANEOUS | 12 refills | Status: DC

## 2018-04-07 MED ORDER — TORSEMIDE 20 MG PO TABS: 40.0000 mg | ORAL_TABLET | Freq: Two times a day (BID) | ORAL | 0 refills | Status: DC

## 2018-04-07 NOTE — Progress Notes (Signed)
CC: Diabetes Mellitus Follow up  HPI:  [name] is a 62 y.o. essential hypertension, chronic combined systolic and diastolic heart failure, persistent atrial fibrillation, osa, prostate cancer s/p laparoscopic radical prostatectomy in May 2019 and diabetes mellitus type 2 who presented for diabetes follow up. Please see problem based charting for evaluation, assessment, and plan.  Past Medical History:  Diagnosis Date  . Abscessed tooth    top back large cavity no pain or drainage, one on bottom  pt pulled tooth 4-5 months ago, right top large hole in tooth  . AKI (acute kidney injury) ([place])   . Allergic rhinitis   . Atrial fibrillation ([place])   . BPH (benign prostatic hypertrophy)    Massive BPH noted on cystoscopy 1/23/ 2012 by Dr. Risa Grill.  . Cardiomyopathy ([place])   . CHF (congestive heart failure) ([place])   . Cough 03/30/2012  . Depression   . Diabetes mellitus 04/08/2008   type 2  . Foley catheter in place 07-05-17 placed  . Headache(784.0)    hx migraines none recent  . Hyperlipemia   . Hypertension   . Hypertensive cardiopathy 03/01/2006   2-D echocardiogram 02/01/2012 showed moderate LVH, mildly to moderately reduced left ventricular systolic function with an estimated ejection fraction of 40-45%, and diffuse hypokinesis.  A nuclear medicine stress study done 01/31/2012 showed no reversible ischemia, a small mid anterior wall fixed defect/infarct, and ejection fraction 42%.      . Neck pain   . Nephrolithiasis 05/29/2010   CT scan of abdomen/pelvis on 05/29/2010 showed an obstructing approximate 1-2 mm calculus at the left UVJ, and an approximate 1-2 mm left lower pole renal calculus.   Patient had continuing severe pain , and an elevation of his serum creatinine to a value of 1.75 on 06/06/2010.  Patient underwent cystoscopy on 06/08/2010 by Dr. Risa Grill, but attempts at retrograde pyelogram and ureteroscopy were unsucc  . Numbness 01/08/2018  . Obstructive sleep apnea  03/06/2008   Sleep study 03/06/08 showed severe OSA/hypopnea syndrome, with successful CPAP titration to 13 CWP using a medium ResMed Mirage Quattro full face mask with heated humidifier.   . Rash 04/17/2014  . Renal calculus 05/29/2010   CT scan of abdomen/pelvis on 05/29/2010 showed an obstructing approximate 1-2 mm calculus at the left UVJ, and an approximate 1-2 mm left lower pole renal calculus.   Patient had continuing severe pain , and an elevation of his serum creatinine to a value of 1.75 on 06/06/2010.  The stone had apparently passed and was not seen on repeat CT 06/08/2010.  [name] Tooth pain 10/29/2017  . Urinary straining 11/02/2016   Review of Systems:    Review of Systems  Constitutional: Positive for malaise/fatigue. Negative for chills and fever.  Respiratory: Positive for shortness of breath.   Cardiovascular: Positive for orthopnea. Negative for chest pain, palpitations and leg swelling.  Gastrointestinal: Negative for nausea and vomiting.  Neurological: Negative for dizziness and headaches.     Physical Exam:  Vitals:   04/07/18 1515  BP: (!) 162/90  Pulse: 85  Temp: 97.8 F (36.6 C)  TempSrc: Oral  SpO2: 100%  Weight: 293 lb 9.6 oz (133.2 kg)  Height: 6\' 1"  (1.854 m)   Physical Exam  Constitutional: He appears well-developed and well-nourished. No distress.  HENT:  Head: Normocephalic and atraumatic.  Eyes: Conjunctivae are normal.  Cardiovascular: Normal rate, regular rhythm and normal heart sounds.  Respiratory: Breath sounds normal. No respiratory distress. He has no wheezes.  GI:  Soft. Bowel sounds are normal. He exhibits mass (abdominal hernia). He exhibits no distension. There is no tenderness.  Musculoskeletal: He exhibits no edema.  Neurological: He is alert.  Skin: He is not diaphoretic.  Psychiatric: He has a normal mood and affect. His behavior is normal. Judgment and thought content normal.        Assessment & Plan:   See Encounters Tab for  problem based charting.  Patient discussed with Dr. Evette Doffing

## 2018-04-07 NOTE — Patient Instructions (Addendum)
It was a pleasure to see you today Mr. Amor. Congratulations, your A1c has come down!  - Please increase your lantus dose to 60u daily -Continue taking metformin 1000mg  twice daily  -Please take torsemide 40mg  twice daily  If you have any questions or concerns, please call our clinic at [phone] between 9am-5pm and after hours call [phone] and ask for the internal medicine resident on call. If you feel you are having a medical emergency please call 911.   Thank you, we look forward to help you remain healthy!  [name], MD Internal Medicine PGY2

## 2018-04-08 NOTE — Assessment & Plan Note (Signed)
The patient's last a1c=9.9 on 01/09/18. During this visit the patient's a1c improved to 8.5. The patient's home blood glucose measurements over the past month have ranged . The patient does/does not note episodes of hypoglycemia.   The patient is currently being prescribed lantus 50u qd, metformin 1000mg  bid. The patient states that he has been taking the lantus regularly, but he has been taking metformin 1000mg  once daily instead of twice. He has gained 8lbs in the past 1 month.   Patient was seen by Ms. [name] Plyler on 03/31/18 who has been helping patient with diabetes management and education.   Assessment and plan  The patient's a1c has improved with better adherence to insulin regimen. Plan to increase lantus to 60u daily. Also instructed patient on the importance of taking metformin twice daily.

## 2018-04-08 NOTE — Assessment & Plan Note (Signed)
The patient mentioned that he has not been adherent to using his CPAP machine nightly due to dental pain. He mentions that he has been more "groggy" and tired recently. He states that the air has been hitting his teeth and causing pain. He has a dental appointment in 2 weeks.   Assessment and plan  Instructed the patient to pre-medicate himself with tylenol prior to using cpap for the next few weeks.

## 2018-04-08 NOTE — Assessment & Plan Note (Signed)
Patient is in normal sinus rhythm per auscultation. The patient should continue xarelto 20mg  qd, amiodarone 200mg  qd, and carvedilol 12.5mg  bid.

## 2018-04-08 NOTE — Assessment & Plan Note (Signed)
The patient's blood pressure during this visit was 162/90. The patient is currently taking carvedilol 12.5mg  bid, amlodipine-atorvastatin 5-40mg  qd, imdur 30mg  qd, losartan 50mg  qd. His last blood pressure visits are  BP Readings from Last 3 Encounters:  04/07/18 (!) 162/90  03/10/18 123/76  02/17/18 (!) 150/87   Assessment and plan  The patient's blood pressure during this visit is not at goal. I suspect that the patient may not be taking all of his medication regularly. He mentions that due to the large pill burden he has been taking some of his medications differently. However, the patient could not recall the names and doses of his medication clearly. I plan to have the patient return for acc visit within few weeks during which time I would also like for pharmacy to work with patient to decrease pill burden and ensure do a mediation reconciliation as the patient did not bring his medication with him to this visit.

## 2018-04-08 NOTE — Assessment & Plan Note (Signed)
Patient's last echo was done in August 2019 which showed lv ef 35-40%, mild lvh, diffuse hypokinesis, g2dd, mildly dilated right ventricle,and moderately dilated right atrium. The patient does not appear volume overloaded on exam. He does mention that he has been getting short of breath more easily lately. The patient has gained 8lbs in the past 1 months.   Patient is currently being prescribed torsemide 40mg  bid, losartan 50mg  qd, and imdur 30mg  qd. However, the patient mentioned that he has not been taking his torsemide regularly as he forgot to refill the medication.   Assessment and plan  Counseled the patient on the importance of taking his torsemide regularly. Refilled the script and instructed to take torsemide 40mg  bid.

## 2018-04-11 ENCOUNTER — Ambulatory Visit: Payer: Medicaid Other | Admitting: Physical Therapy

## 2018-04-11 NOTE — Progress Notes (Signed)
Internal Medicine Clinic Attending  Case discussed with Dr. [name] at the time of the visit.  We reviewed the resident's history and exam and pertinent patient test results.  I agree with the assessment, diagnosis, and plan of care documented in the resident's note. 

## 2018-04-18 ENCOUNTER — Encounter: Payer: Self-pay | Admitting: Physical Therapy

## 2018-04-18 ENCOUNTER — Ambulatory Visit: Payer: Medicaid Other | Attending: Hematology | Admitting: Physical Therapy

## 2018-04-18 DIAGNOSIS — M6281 Muscle weakness (generalized): Secondary | ICD-10-CM

## 2018-04-18 DIAGNOSIS — R2689 Other abnormalities of gait and mobility: Secondary | ICD-10-CM

## 2018-04-18 NOTE — Patient Instructions (Addendum)
Access Code: [email or web address]  URL: [email or web address]  Date: 04/18/2018  Prepared by: Jamey Reas   Exercises  Standing Gastroc Stretch at [employer] - 3 sets - 30 hold - 1x daily - 7x weekly  Heel Toe Raises with Counter Support - 10 reps - 3 sets - 1x daily - 7x weekly  Supine March with Alternating Leg Lifts - 10 reps - 3 sets - 1x daily - 7x weekly  Hooklying Isometric Hip Flexion - 10 reps - 3 sets - 3 hold - 1x daily - 7x weekly  Supine Piriformis Stretch with Leg Straight - 3 sets - 30 hold - 1x daily - 7x weekly  Supine Bridge - 10 reps - 3 sets - 3 hold - 1x daily - 7x weekly  Standing Single Leg Ball Rolls - Forward Backward - 10 reps - 1 sets - 5 seconds hold - 1x daily - 5x weekly  Standing Single Leg Ball Rolls - Side to Side - 10 reps - 1 sets - 5 seconds hold - 1x daily - 5x weekly  Single Leg Stance Rolling Ball in Circle - 10 reps - 1 sets - 5 seconds hold - 1x daily - 5x weekly   Standing with one foot on block for modified single leg stance. Progress to foot on kids ball.

## 2018-04-19 NOTE — Therapy (Signed)
[facility] [address] [place], [place], [address] Phone: [phone]   Fax:  [phone]  Physical Therapy Treatment  Patient Details  Name: Terry Parks MRN: [id number] Date of Birth: 06-24-1955 Referring Provider (PT): [name], MD resident, Murriel Hopper, MD   Encounter Date: 04/18/2018  PT End of Session - 04/18/18 1629    Visit Number  4    Number of Visits  8    Date for PT Re-Evaluation  05/12/18    Authorization Type  MCD pending, likely only 3 visits to start so one time a week for 3 weeks then reassess short term goals and resubmit for more visits    PT Start Time  1531    PT Stop Time  1618    PT Time Calculation (min)  47 min    Activity Tolerance  Patient tolerated treatment well    Behavior During Therapy  [facility] for tasks assessed/performed       Past Medical History:  Diagnosis Date  . Abscessed tooth    top back large cavity no pain or drainage, one on bottom  pt pulled tooth 4-5 months ago, right top large hole in tooth  . AKI (acute kidney injury) ([place])   . Allergic rhinitis   . Atrial fibrillation ([place])   . BPH (benign prostatic hypertrophy)    Massive BPH noted on cystoscopy 1/23/ 2012 by Dr. Risa Grill.  . Cardiomyopathy ([place])   . CHF (congestive heart failure) ([place])   . Cough 03/30/2012  . Depression   . Diabetes mellitus 04/08/2008   type 2  . Foley catheter in place 07-05-17 placed  . Headache(784.0)    hx migraines none recent  . Hyperlipemia   . Hypertension   . Hypertensive cardiopathy 03/01/2006   2-D echocardiogram 02/01/2012 showed moderate LVH, mildly to moderately reduced left ventricular systolic function with an estimated ejection fraction of 40-45%, and diffuse hypokinesis.  A nuclear medicine stress study done 01/31/2012 showed no reversible ischemia, a small mid anterior wall fixed defect/infarct, and ejection fraction 42%.      . Neck pain   . Nephrolithiasis  05/29/2010   CT scan of abdomen/pelvis on 05/29/2010 showed an obstructing approximate 1-2 mm calculus at the left UVJ, and an approximate 1-2 mm left lower pole renal calculus.   Patient had continuing severe pain , and an elevation of his serum creatinine to a value of 1.75 on 06/06/2010.  Patient underwent cystoscopy on 06/08/2010 by Dr. Risa Grill, but attempts at retrograde pyelogram and ureteroscopy were unsucc  . Numbness 01/08/2018  . Obstructive sleep apnea 03/06/2008   Sleep study 03/06/08 showed severe OSA/hypopnea syndrome, with successful CPAP titration to 13 CWP using a medium ResMed Mirage Quattro full face mask with heated humidifier.   . Rash 04/17/2014  . Renal calculus 05/29/2010   CT scan of abdomen/pelvis on 05/29/2010 showed an obstructing approximate 1-2 mm calculus at the left UVJ, and an approximate 1-2 mm left lower pole renal calculus.   Patient had continuing severe pain , and an elevation of his serum creatinine to a value of 1.75 on 06/06/2010.  The stone had apparently passed and was not seen on repeat CT 06/08/2010.  [name] Tooth pain 10/29/2017  . Urinary straining 11/02/2016    Past Surgical History:  Procedure Laterality Date  . big toe nails removed Bilateral 20 yrs ago  . CARDIOVERSION N/A 06/08/2017   Procedure: CARDIOVERSION;  Surgeon: Josue Hector, MD;  Location: [facility] ENDOSCOPY;  Service: Cardiovascular;  Laterality: N/A;  . CYSTOSCOPY W/ RETROGRADES    . IR RADIOLOGIST EVAL & MGMT  02/02/2018  . LYMPHADENECTOMY Bilateral 10/06/2017   Procedure: LYMPHADENECTOMY;  Surgeon: Lucas Mallow, MD;  Location: [facility] ORS;  Service: Urology;  Laterality: Bilateral;  . ROBOT ASSISTED LAPAROSCOPIC RADICAL PROSTATECTOMY N/A 10/06/2017   Procedure: XI ROBOTIC ASSISTED LAPAROSCOPIC RADICAL PROSTATECTOMY;  Surgeon: Lucas Mallow, MD;  Location: [facility] ORS;  Service: Urology;  Laterality: N/A;  . TRANSURETHRAL RESECTION OF BLADDER TUMOR N/A 07/13/2017   Procedure: TRANSURETHRAL RESECTION OF  PROSTATE;  Surgeon: Lucas Mallow, MD;  Location: [facility] ORS;  Service: Urology;  Laterality: N/A;    There were no vitals filed for this visit.  Subjective Assessment - 04/18/18 1532    Subjective  HEP is going well with the ones that he does. No falls. He stumbles off balance but has been able to catch himself.     Pertinent History  PMH: DM,HTN,CHF,CKDIII,prostate CA S/P prostateecomy in may 2019    Limitations  Standing;Walking    How long can you stand comfortably?  1 hour before leg/feet pain    How long can you walk comfortably?  grocery store distance    Patient Stated Goals  get the pain out of my legs/feet    Currently in Pain?  Yes    Pain Score  6    In last week worst 7/10, best 2/10   Pain Location  Back    Pain Orientation  Mid;Lower    Pain Descriptors / Indicators  Aching;Spasm    Pain Type  Chronic pain    Pain Onset  More than a month ago    Pain Frequency  Constant    Aggravating Factors   laying in wrong position, walk too far    Pain Relieving Factors  heat at therapy,                            Therpuetic Activities: PT demo & instructed in proper technique for sit to /from supine via sidelying to protect back. 5 times sit to/from stand 16.02sec.   Access Code: [email or web address]  URL: [email or web address]  Date: 04/18/2018  Prepared by: Jamey Reas   Exercises  Standing Gastroc Stretch at [employer] - 3 sets - 30 hold - 1x daily - 7x weekly  Heel Toe Raises with Counter Support - 10 reps - 3 sets - 1x daily - 7x weekly  Supine March with Alternating Leg Lifts - 10 reps - 3 sets - 1x daily - 7x weekly  Hooklying Isometric Hip Flexion - 10 reps - 3 sets - 3 hold - 1x daily - 7x weekly  Supine Piriformis Stretch with Leg Straight - 3 sets - 30 hold - 1x daily - 7x weekly  Supine Bridge - 10 reps - 3 sets - 3 hold - 1x daily - 7x weekly  Standing Single Leg Ball Rolls - Forward Backward - 10 reps - 1 sets - 5 seconds hold - 1x  daily - 5x weekly  Standing Single Leg Ball Rolls - Side to Side - 10 reps - 1 sets - 5 seconds hold - 1x daily - 5x weekly  Single Leg Stance Rolling Ball in Circle - 10 reps - 1 sets - 5 seconds hold - 1x daily - 5x weekly   Standing with one foot on block for modified single leg stance. Progress to foot on kids ball.  PT Education - 04/18/18 1600    Education Details  reviewed HEP & corrected technique. Removed single leg stance & added modified stand / ball rolls.     Person(s) Educated  Patient    Methods  Explanation;Demonstration;Tactile cues;Verbal cues;Handout    Comprehension  Verbalized understanding;Returned demonstration;Verbal cues required;Need further instruction       PT Short Term Goals - 04/18/18 1545      PT SHORT TERM GOAL #1   Title  Pt will be I and compliant with HEP. 3 weeks 04/07/18    Baseline  MET 04/18/2018    Status  Achieved      PT SHORT TERM GOAL #2   Title  Pt will improve 5TSTS test to less than 17 sec to show improved endurance. 3 weeks 04/07/18    Baseline  MET 04/18/2018  5X STS 16.02 sec    Status  Achieved        PT Long Term Goals - 03/17/18 1048      PT LONG TERM GOAL #1   Title  Pt will improve 5TSTS to less 14 sec to show improved endurance. 8 weeks 05/12/18    Baseline  19 sec    Status  New      PT LONG TERM GOAL #2   Title  Pt will improve gait speed to at least 4.68f/sec. 8 weeks 05/12/18    Baseline  3.9 ft/sec    Status  New      PT LONG TERM GOAL #3   Title  Pt will be able to hold SLS at least 6 sec on avg to show improved balance. 8 weeks 05/12/18    Baseline  3 sec avg    Status  New      PT LONG TERM GOAL #4   Title  Pt will be able to walk up/down stairs 26 reciprocally using one handrail. 8 weeks 05/12/18    Baseline  has to do stairs one at a time in step to pattern due to Rt leg weakness.    Status  New            Plan - 04/18/18 1631    Clinical Impression Statement  Patient met STGs set for initial 3  visits. He demonstrates understanding of sit to/from supine via sidelying to protect his back. patient would benefit from additional visits in plan of care to meet LTG level of function.     Rehab Potential  Good    PT Frequency  1x / week    PT Duration  8 weeks    PT Treatment/Interventions  Electrical Stimulation;Moist Heat;Gait training;Stair training;Therapeutic activities;Therapeutic exercise;Balance training;Neuromuscular re-education;Manual techniques    PT Next Visit Plan  work towards [place] [email or web address] (self massage to calf, feet with tennis ball)    Consulted and Agree with Plan of Care  Patient       Patient will benefit from skilled therapeutic intervention in order to improve the following deficits and impairments:  Abnormal gait, Decreased activity tolerance, Decreased endurance, Decreased strength, Pain, Postural dysfunction, Difficulty walking, Decreased balance  Visit Diagnosis: Muscle weakness (generalized)  Other abnormalities of gait and mobility     Problem List Patient Active Problem List   Diagnosis Date Noted  . Abdominal hernia 03/10/2018  . Stage 3 chronic kidney disease ([place] 02/11/2018  . Healthcare maintenance 02/11/2018  . Physical deconditioning 02/11/2018  . Microcytic anemia 02/11/2018  . Pelvic abscess in male ([facility]  01/14/2018  . SBO (small bowel obstruction) ([place]) 01/13/2018  . Stress incontinence of urine 01/06/2018  . Candidiasis of perineum 01/05/2018  . Pain due to dental caries 11/09/2017  . Prostate cancer ([place]) 10/06/2017  . Bladder mass 07/13/2017  . Persistent atrial fibrillation   . Vitamin D deficiency 06/22/2016  . Neuropathy in diabetes ([place]) 10/07/2015  . Erectile dysfunction 04/17/2014  . Allergic rhinitis 10/19/2012  . Diabetic retinopathy ([place]) 10/04/2012  . BPH   . Type 2 diabetes mellitus with peripheral neuropathy ([place]) 04/08/2008  . Obstructive sleep apnea   . Morbid obesity ([place])   .  Hyperlipidemia associated with type 2 diabetes mellitus ([place])   . Essential hypertension   . Chronic combined systolic and diastolic CHF (congestive heart failure) ([place]) 03/01/2006    Nikeria Kalman PT, DPT 04/19/2018, 4:35 PM  [facility] [address] [place], [place], [address] Phone: [phone]   Fax:  [phone]  Name: Terry Parks MRN: [id number] Date of Birth: 06/27/1955

## 2018-04-24 ENCOUNTER — Other Ambulatory Visit (INDEPENDENT_AMBULATORY_CARE_PROVIDER_SITE_OTHER): Payer: Self-pay

## 2018-04-24 ENCOUNTER — Encounter: Payer: Self-pay | Admitting: Gastroenterology

## 2018-04-24 ENCOUNTER — Telehealth: Payer: Self-pay

## 2018-04-24 ENCOUNTER — Ambulatory Visit (INDEPENDENT_AMBULATORY_CARE_PROVIDER_SITE_OTHER): Payer: Self-pay | Admitting: Gastroenterology

## 2018-04-24 VITALS — BP 178/100 | HR 98 | Ht 73.0 in | Wt 292.0 lb

## 2018-04-24 DIAGNOSIS — D509 Iron deficiency anemia, unspecified: Secondary | ICD-10-CM

## 2018-04-24 DIAGNOSIS — R718 Other abnormality of red blood cells: Secondary | ICD-10-CM

## 2018-04-24 DIAGNOSIS — M6208 Separation of muscle (nontraumatic), other site: Secondary | ICD-10-CM

## 2018-04-24 LAB — IBC PANEL
Iron: 42 ug/dL (ref 42–165)
Saturation Ratios: 10.3 % — ABNORMAL LOW (ref 20.0–50.0)
Transferrin: 291 mg/dL (ref 212.0–360.0)

## 2018-04-24 LAB — CBC
HCT: 40.2 % (ref 39.0–52.0)
Hemoglobin: 13 g/dL (ref 13.0–17.0)
MCHC: 32.3 g/dL (ref 30.0–36.0)
MCV: 79.6 fl (ref 78.0–100.0)
Platelets: 181 10*3/uL (ref 150.0–400.0)
RBC: 5.06 Mil/uL (ref 4.22–5.81)
RDW: 18.7 % — ABNORMAL HIGH (ref 11.5–15.5)
WBC: 6.9 10*3/uL (ref 4.0–10.5)

## 2018-04-24 LAB — RETICULOCYTES
ABS Retic: 55550 cells/uL (ref 25000–9000)
Retic Ct Pct: 1.1 %

## 2018-04-24 LAB — FERRITIN: Ferritin: 35.9 ng/mL (ref 22.0–322.0)

## 2018-04-24 LAB — FOLATE: Folate: 12.8 ng/mL (ref >5.9)

## 2018-04-24 LAB — VITAMIN B12: Vitamin B-12: 257 pg/mL (ref 211–911)

## 2018-04-24 MED ORDER — PEG 3350-KCL-NABCB-NACL-NASULF 236 G PO SOLR: 4000.0000 mL | Freq: Once | ORAL | 0 refills | Status: AC

## 2018-04-24 MED FILL — GAVILYTE-G SOLUTION: 236 | 1 days supply | Qty: 4000 | Fill #0

## 2018-04-24 NOTE — Progress Notes (Signed)
[place] VISIT   Primary Care Provider [name], MD [place] [address] [phone]  Referring Provider [name], MD [address] [place], [place] [address] [phone]  Patient Profile: Terry Parks is a 62 y.o. male with a pmh significant for Afib (on Xarelto), HTN, HLD, OSA, BPH, MDD, Nephrolithiasis, DM, HFrEF.  The patient presents to the [facility] for an evaluation and management of problem(s) noted below:  Problem List 1. Iron deficiency anemia, unspecified iron deficiency anemia type   2. Microcytosis   3. Diastasis recti     History of Present Illness: This is the patient's first visit to the outpatient of our GI clinic.  The patient was unclear what he was here for, he thought he was here for a surgical consultation for his ventral hernia.  The referral from his primary provider was for evaluation of iron deficiency anemia.  The patient does describe issues of loss of appetite and mild anorexia however his weight has not been decreasing.  He has no abdominal pain.  He has no changes in his bowel habits.  He denies any melena or hematochezia.  He has not noted any overt GI bleeding in regards to hematemesis or coffee-ground emesis.  He reports a prior colonoscopy.  He denies a prior endoscopy.  All his medications that he takes are in the system.  He has been taking iron on a daily basis.  He is not clear if he is on his rivaroxaban or not however.  GI Review of Systems Positive as above Negative for dysphagia, odynophagia, early satiety, abdominal pain, pyrosis  Pyrosis; Reflux; Regurgitation; Dysphagia; Odynophagia; Globus; Post-prandial cough; Nocturnal cough; Nasal regurgitation; Epigastric pain; Nausea; Vomiting; Hematemesis; Jaundice; Change in Appetite; Early satiety; Abdominal pain; Abdominal bloating; Eructation; Flatulence; Change in BM Frequency; Change in BM Consistency; Constipation;  Diarrhea; Incontinence; Urgency; Tenesmus; Hematochezia; Melena  Review of Systems General: Denies fevers/chills/weight loss HEENT: Denies oral lesions Cardiovascular: Denies chest pain Pulmonary: Shortness of breath at baseline Gastroenterological: See HPI Genitourinary: Denies darkened urine or hematuria Hematological: Positive for easy bruising/bleeding due to history of anticoagulation Endocrine: Denies temperature intolerance Dermatological: Denies jaundice Psychological: Mood is stable   Medications Current Outpatient Medications  Medication Sig Dispense Refill  . acetaminophen (TYLENOL) 325 MG tablet Take 325-650 mg by mouth every 6 (six) hours as needed (for pain).    [name] amiodarone (PACERONE) 200 MG tablet TAKE 1 Tablet BY MOUTH EVERY DAY 90 tablet 0  . amLODipine (NORVASC) 5 MG tablet TAKE 1 Tablet BY MOUTH ONCE DAILY 30 tablet 2  . atorvastatin (LIPITOR) 40 MG tablet Take 1 tablet (40 mg total) by mouth daily. 90 tablet 0  . carvedilol (COREG) 12.5 MG tablet Take 1 tablet (12.5 mg total) by mouth 2 (two) times daily with a meal. 180 tablet 3  . DULoxetine (CYMBALTA) 60 MG capsule Take 1 capsule (60 mg total) by mouth daily. 90 capsule 3  . ferrous sulfate 325 (65 FE) MG EC tablet Take 1 tablet (325 mg total) by mouth daily. 90 tablet 0  . gabapentin (NEURONTIN) 300 MG capsule TAKE 1 Capsule BY MOUTH EVERY DAY 30 capsule 0  . glucose blood (TRUETRACK TEST) test strip Use as directed to test blood sugar three times a day before meals. 100 each 1  . Insulin Glargine (BASAGLAR KWIKPEN) 100 UNIT/ML SOPN Inject 60 Units into the skin at bedtime 15 mL 12  . Insulin Pen Needle (CARETOUCH PEN NEEDLES) 31G  X 6 MM MISC 1 pen by Does not apply route at bedtime. 100 each 3  . isosorbide mononitrate (IMDUR) 30 MG 24 hr tablet Take 1 tablet (30 mg total) by mouth daily. 90 tablet 3  . Lancets MISC Use to test blood sugar three times a day before meals. 100 each 1  . losartan (COZAAR) 50 MG  tablet Take 1 tablet (50 mg total) by mouth daily. 90 tablet 3  . metFORMIN (GLUCOPHAGE-XR) 500 MG 24 hr tablet Take 2 tablets (1,000 mg total) by mouth 2 (two) times daily with a meal. 120 tablet 11  . Misc. Devices MISC by Does not apply route. C-PAP    . nystatin cream (MYCOSTATIN) Apply 1 application topically See admin instructions. Apply to arms and legs 2 times a day 30 g 1  . polyethylene glycol (MIRALAX / GLYCOLAX) packet Take 17 g by mouth daily. 14 each 0  . rivaroxaban (XARELTO) 20 MG TABS tablet Take 20 mg by mouth daily with breakfast.    . torsemide (DEMADEX) 20 MG tablet Take 2 tablets (40 mg total) by mouth 2 (two) times daily. 360 tablet 0   No current facility-administered medications for this visit.     Allergies Allergies  Allergen Reactions  . Lisinopril Cough    Histories Past Medical History:  Diagnosis Date  . Abscessed tooth    top back large cavity no pain or drainage, one on bottom  pt pulled tooth 4-5 months ago, right top large hole in tooth  . AKI (acute kidney injury) ([place])   . Allergic rhinitis   . Atrial fibrillation ([place])   . BPH (benign prostatic hypertrophy)    Massive BPH noted on cystoscopy 1/23/ 2012 by Dr. Risa Grill.  . Cardiomyopathy ([place])   . CHF (congestive heart failure) ([place])   . Cough 03/30/2012  . Depression   . Diabetes mellitus 04/08/2008   type 2  . Foley catheter in place 07-05-17 placed  . Headache(784.0)    hx migraines none recent  . Hyperlipemia   . Hypertension   . Hypertensive cardiopathy 03/01/2006   2-D echocardiogram 02/01/2012 showed moderate LVH, mildly to moderately reduced left ventricular systolic function with an estimated ejection fraction of 40-45%, and diffuse hypokinesis.  A nuclear medicine stress study done 01/31/2012 showed no reversible ischemia, a small mid anterior wall fixed defect/infarct, and ejection fraction 42%.      . Neck pain   . Nephrolithiasis 05/29/2010   CT scan of abdomen/pelvis on 05/29/2010  showed an obstructing approximate 1-2 mm calculus at the left UVJ, and an approximate 1-2 mm left lower pole renal calculus.   Patient had continuing severe pain , and an elevation of his serum creatinine to a value of 1.75 on 06/06/2010.  Patient underwent cystoscopy on 06/08/2010 by Dr. Risa Grill, but attempts at retrograde pyelogram and ureteroscopy were unsucc  . Numbness 01/08/2018  . Obstructive sleep apnea 03/06/2008   Sleep study 03/06/08 showed severe OSA/hypopnea syndrome, with successful CPAP titration to 13 CWP using a medium ResMed Mirage Quattro full face mask with heated humidifier.   . Rash 04/17/2014  . Renal calculus 05/29/2010   CT scan of abdomen/pelvis on 05/29/2010 showed an obstructing approximate 1-2 mm calculus at the left UVJ, and an approximate 1-2 mm left lower pole renal calculus.   Patient had continuing severe pain , and an elevation of his serum creatinine to a value of 1.75 on 06/06/2010.  The stone had apparently passed and was not seen  on repeat CT 06/08/2010.  [name] Tooth pain 10/29/2017  . Urinary straining 11/02/2016   Past Surgical History:  Procedure Laterality Date  . big toe nails removed Bilateral 20 yrs ago  . CARDIOVERSION N/A 06/08/2017   Procedure: CARDIOVERSION;  Surgeon: Josue Hector, MD;  Location: [facility] ENDOSCOPY;  Service: Cardiovascular;  Laterality: N/A;  . CYSTOSCOPY W/ RETROGRADES    . IR RADIOLOGIST EVAL & MGMT  02/02/2018  . LYMPHADENECTOMY Bilateral 10/06/2017   Procedure: LYMPHADENECTOMY;  Surgeon: Lucas Mallow, MD;  Location: [facility] ORS;  Service: Urology;  Laterality: Bilateral;  . ROBOT ASSISTED LAPAROSCOPIC RADICAL PROSTATECTOMY N/A 10/06/2017   Procedure: XI ROBOTIC ASSISTED LAPAROSCOPIC RADICAL PROSTATECTOMY;  Surgeon: Lucas Mallow, MD;  Location: [facility] ORS;  Service: Urology;  Laterality: N/A;  . TRANSURETHRAL RESECTION OF BLADDER TUMOR N/A 07/13/2017   Procedure: TRANSURETHRAL RESECTION OF PROSTATE;  Surgeon: Lucas Mallow, MD;  Location:  [facility] ORS;  Service: Urology;  Laterality: N/A;   Social History   Socioeconomic History  . Marital status: Single    Spouse name: Not on file  . Number of children: 4 b  . Years of education: 64  . Highest education level: Not on file  Occupational History  . Occupation:        Employer: UNEMPLOYED  Social Needs  . Financial resource strain: Not on file  . Food insecurity:    Worry: Not on file    Inability: Not on file  . Transportation needs:    Medical: Not on file    Non-medical: Not on file  Tobacco Use  . Smoking status: Never Smoker  . Smokeless tobacco: Never Used  Substance and Sexual Activity  . Alcohol use: No    Alcohol/week: 0.0 standard drinks  . Drug use: No  . Sexual activity: Not Currently  Lifestyle  . Physical activity:    Days per week: Not on file    Minutes per session: Not on file  . Stress: Not on file  Relationships  . Social connections:    Talks on phone: Not on file    Gets together: Not on file    Attends religious service: Not on file    Active member of club or organization: Not on file    Attends meetings of clubs or organizations: Not on file    Relationship status: Not on file  . Intimate partner violence:    Fear of current or ex partner: Not on file    Emotionally abused: Not on file    Physically abused: Not on file    Forced sexual activity: Not on file  Other Topics Concern  . Not on file  Social History Narrative   Divorced, 4 children, lives alone.     Family History  Problem Relation Age of Onset  . Breast cancer Mother   . Hypertension Father   . Diabetes Maternal Grandmother   . Colon cancer Neg Hx   . Prostate cancer Neg Hx   . Heart attack Neg Hx   . Esophageal cancer Neg Hx   . Inflammatory bowel disease Neg Hx   . Liver disease Neg Hx   . Pancreatic cancer Neg Hx   . Rectal cancer Neg Hx   . Stomach cancer Neg Hx    I have reviewed his medical, social, and family history in detail and updated the  electronic medical record as necessary.    PHYSICAL EXAMINATION  BP (!) 178/100   Pulse 98  Ht 6\' 1"  (1.854 m)   Wt 292 lb (132.5 kg)   BMI 38.52 kg/m  Wt Readings from Last 3 Encounters:  04/24/18 292 lb (132.5 kg)  04/07/18 293 lb 9.6 oz (133.2 kg)  03/10/18 285 lb 9.6 oz (129.5 kg)  GEN: NAD, appears stated age, mildly disheveled, doesn't appear chronically ill PSYCH: Cooperative, without pressured speech EYE: Conjunctivae pale-pink, sclerae anicteric ENT: MMM, without oral ulcers, no erythema or exudates noted NECK: Supple, enlarged neck girth CV: RR without R/Gs  RESP: Decreased breath sounds at the bases bilaterally without adventitious sounds present GI: NABS, soft, protuberant, rounded, obese, ventral hernia/diastases noted, without rebound or guarding, unable to appreciate hepatosplenomegaly due to body habitus MSK/EXT: Trace bilateral lower extremity edema SKIN: No jaundice NEURO:  Alert & Oriented x 3, no focal deficits   REVIEW OF DATA  I reviewed the following data at the time of this encounter:  GI Procedures and Studies  2011 Colonoscopy ENDOSCOPIC IMPRESSION:     1) No polyps or cancers     2) Normal colonoscopy RECOMMENDATIONS:     1) high fiber diet  Laboratory Studies  Reviewed in Epic  02/13/2018 16:26  Iron 27 (L)  UIBC 299  TIBC 326  Iron Saturation 8 (LL)   Imaging Studies  9/19 CTAP IMPRESSION: The abdominal and anterior pelvic fluid collections have resolved with the drainage catheter. No residual abscess collections. Stable position of the percutaneous drain. Mild residual edema in the lower abdomen and pelvis. Wall thickening and irregularity of the urinary bladder is nonspecific but there may be residual inflammatory changes in this area. Ventral hernias containing bowel loops but no evidence for obstruction or complicating features.   ASSESSMENT  Mr. Calix is a 62 y.o. male with a pmh significant for Afib (on Xarelto), HTN,  HLD, OSA, BPH, MDD, Nephrolithiasis, DM, HFrEF.  The patient is seen today for evaluation and management of:  1. Iron deficiency anemia, unspecified iron deficiency anemia type   2. Microcytosis   3. Diastasis recti    The patient is hemodynamically stable.  After discussing things with him he has been having significant fatigue but that has been improving white been taking iron which she believes is been taking on a daily basis.  The etiology of his see anemia with microcytosis is not completely clear.  We have discussed the role of an endoscopic evaluation to look for sources including ulcer disease, angiectasia's, duodenal fold lesions, occult GI bleeding sources from the colon or upper GI tract.  He will continue his oral iron supplementation and we will plan to repeat iron indices today and a blood count to see what has changed over the course the last few months.  He will need to discuss with his cardiology service and primary care doctor as to the ability to come off rivaroxaban for a total of 2 days prior to procedures.  Regards to his ventral diastases it is not so significant that I think the patient needs to have an emergent surgical consultation but it would be reasonable for further work-up if he continues to have issues.  After talking with him he feels comfortable with waiting but will let us know when he feels ready for a true consultation.  The risks and benefits of endoscopic evaluation were discussed with the patient; these include but are not limited to the risk of perforation, infection, bleeding, missed lesions, lack of diagnosis, severe illness requiring hospitalization, as well as anesthesia and sedation related illnesses.  The patient is agreeable to proceed.  All patient questions were answered, to the best of my ability, and the patient agrees to the aforementioned plan of action with follow-up as indicated.   PLAN  Laboratory work-up as noted below Diagnostic upper and lower  endoscopy (gastric biopsies/duodenal biopsies to be obtained at a minimum) Hold on surgical referral the patient will let us or PCP know when he wants it   Orders Placed This Encounter  Procedures  . CBC  . IBC panel  . Ferritin  . Reticulocytes  . B12  . Folate  . Ambulatory referral to Gastroenterology    New Prescriptions   No medications on file   Modified Medications   No medications on file    Planned Follow Up: No follow-ups on file.   [name], MD [facility] Office # [phone]

## 2018-04-24 NOTE — Patient Instructions (Signed)
If you are age 60 or older, your body mass index should be between 23-30. Your Body mass index is 38.52 kg/m. If this is out of the aforementioned range listed, please consider follow up with your Primary Care Provider.  If you are age 7 or younger, your body mass index should be between 19-25. Your Body mass index is 38.52 kg/m. If this is out of the aformentioned range listed, please consider follow up with your Primary Care Provider.   You have been scheduled for an endoscopy and colonoscopy. Please follow the written instructions given to you at your visit today. Please pick up your prep supplies at the pharmacy within the next 1-3 days. If you use inhalers (even only as needed), please bring them with you on the day of your procedure. Your physician has requested that you go to www.startemmi.com and enter the access code given to you at your visit today. This web site gives a general overview about your procedure. However, you should still follow specific instructions given to you by our office regarding your preparation for the procedure.  We have sent the following medications to your pharmacy for you to pick up at your convenience: Golytely  Your provider has requested that you go to the basement level for lab work before leaving today. Press "B" on the elevator. The lab is located at the first door on the left as you exit the elevator.   You will be contacted by our office prior to your procedure for directions on holding your Xarelto.  If you do not hear from our office 1 week prior to your scheduled procedure, please call [phone] to discuss.   CONTINUE IRON DAILY (call us back to tell us how much Iron you are taking)  Thank you for choosing me and [facility].   Terry Britain, MD

## 2018-04-24 NOTE — Telephone Encounter (Signed)
  [facility] [address] [place], [place]  [address] Phone:  [phone]   Fax:  [phone]  04/24/2018   RE:      Terry Parks DOB:   Sep 23, 1955 MRN:   [id number]   Dear Dr. Maricela Bo,    We have scheduled the above patient for an endoscopic procedure. Our records show that he is on anticoagulation therapy.   Please advise as to whether the patient may come off his therapy of Xarelto 2 days prior to the procedure, which is scheduled for 06/06/18.  Please fax back/ or route to [place], [place] at [phone].   Sincerely,    Thurmon Fair, RMA

## 2018-04-25 ENCOUNTER — Telehealth: Payer: Self-pay

## 2018-04-25 ENCOUNTER — Ambulatory Visit: Payer: Medicaid Other

## 2018-04-25 DIAGNOSIS — D509 Iron deficiency anemia, unspecified: Secondary | ICD-10-CM

## 2018-04-25 NOTE — Telephone Encounter (Signed)
The patient has been notified of this information and all questions answered.   He will increase iron to BID and have labs in 2 months.

## 2018-04-25 NOTE — Telephone Encounter (Signed)
-----   Message from Irving Copas., MD sent at 04/25/2018 10:29 AM EST ----- Chong Sicilian, Thanks for update. Let us plan to increase his Iron to twice daily. Repeat Iron/TIBC/Ferritin/CBC in 35-months. Plan for endoscopic evaluation as per clinic visit. Thanks. Gabe ----- Message ----- From: Timothy Lasso, RN Sent: 04/25/2018  10:18 AM EST To: Irving Copas., MD  The pt called back to inform Dr Rush Landmark that he is taking FeroSal 325 mg OTC one daily.

## 2018-04-28 ENCOUNTER — Encounter: Payer: Self-pay | Admitting: Gastroenterology

## 2018-04-28 DIAGNOSIS — D509 Iron deficiency anemia, unspecified: Secondary | ICD-10-CM | POA: Insufficient documentation

## 2018-04-28 DIAGNOSIS — M6208 Separation of muscle (nontraumatic), other site: Secondary | ICD-10-CM | POA: Insufficient documentation

## 2018-05-02 ENCOUNTER — Telehealth: Payer: Self-pay | Admitting: Physical Therapy

## 2018-05-02 ENCOUNTER — Ambulatory Visit: Payer: Medicaid Other | Admitting: Physical Therapy

## 2018-05-02 ENCOUNTER — Ambulatory Visit: Payer: Medicaid Other

## 2018-05-02 ENCOUNTER — Ambulatory Visit: Payer: Self-pay | Admitting: Dietician

## 2018-05-02 ENCOUNTER — Telehealth: Payer: Self-pay | Admitting: Dietician

## 2018-05-02 ENCOUNTER — Encounter: Payer: Self-pay | Admitting: Physical Therapy

## 2018-05-02 DIAGNOSIS — M6281 Muscle weakness (generalized): Secondary | ICD-10-CM

## 2018-05-02 DIAGNOSIS — R2689 Other abnormalities of gait and mobility: Secondary | ICD-10-CM

## 2018-05-02 NOTE — Telephone Encounter (Signed)
Mr. Dimperio is able to function more and with less back pain with use of rollator walker. Please place an order in Epic or write a prescription and FAX it to [phone] for tall adult rollator walker with seat.  Thank you Jamey Reas, PT, DPT

## 2018-05-02 NOTE — Telephone Encounter (Signed)
rescheduled missed appointment.

## 2018-05-03 NOTE — Therapy (Signed)
[facility] [address] [place], [place], [address] Phone: [phone]   Fax:  [phone]  Physical Therapy Treatment  Patient Details  Name: Terry Parks MRN: [id number] Date of Birth: [birth year] Referring Provider (PT): [name], MD resident, Murriel Hopper, MD   Encounter Date: 05/02/2018  PT End of Session - 05/02/18 1835    Visit Number  5    Number of Visits  8    Date for PT Re-Evaluation  05/12/18    Authorization Type  MCD pending, likely only 3 visits to start so one time a week for 3 weeks then reassess short term goals and resubmit for more visits    Authorization Time [place] approved thru 05/11/2018    PT Start Time  1358    PT Stop Time  1440    PT Time Calculation (min)  42 min    Activity Tolerance  Patient limited by pain    Behavior During Therapy  [facility] for tasks assessed/performed       Past Medical History:  Diagnosis Date  . Abscessed tooth    top back large cavity no pain or drainage, one on bottom  pt pulled tooth 4-5 months ago, right top large hole in tooth  . AKI (acute kidney injury) ([place])   . Allergic rhinitis   . Atrial fibrillation ([place])   . BPH (benign prostatic hypertrophy)    Massive BPH noted on cystoscopy 1/23/ 2012 by Dr. Risa Grill.  . Cardiomyopathy ([place])   . CHF (congestive heart failure) ([place])   . Cough 03/30/2012  . Depression   . Diabetes mellitus 04/08/2008   type 2  . Foley catheter in place 07-05-17 placed  . Headache(784.0)    hx migraines none recent  . Hyperlipemia   . Hypertension   . Hypertensive cardiopathy 03/01/2006   2-D echocardiogram 02/01/2012 showed moderate LVH, mildly to moderately reduced left ventricular systolic function with an estimated ejection fraction of 40-45%, and diffuse hypokinesis.  A nuclear medicine stress study done 01/31/2012 showed no reversible ischemia, a small mid anterior wall fixed  defect/infarct, and ejection fraction 42%.      . Neck pain   . Nephrolithiasis 05/29/2010   CT scan of abdomen/pelvis on 05/29/2010 showed an obstructing approximate 1-2 mm calculus at the left UVJ, and an approximate 1-2 mm left lower pole renal calculus.   Patient had continuing severe pain , and an elevation of his serum creatinine to a value of 1.75 on 06/06/2010.  Patient underwent cystoscopy on 06/08/2010 by Dr. Risa Grill, but attempts at retrograde pyelogram and ureteroscopy were unsucc  . Numbness 01/08/2018  . Obstructive sleep apnea 03/06/2008   Sleep study 03/06/08 showed severe OSA/hypopnea syndrome, with successful CPAP titration to 13 CWP using a medium ResMed Mirage Quattro full face mask with heated humidifier.   . Rash 04/17/2014  . Renal calculus 05/29/2010   CT scan of abdomen/pelvis on 05/29/2010 showed an obstructing approximate 1-2 mm calculus at the left UVJ, and an approximate 1-2 mm left lower pole renal calculus.   Patient had continuing severe pain , and an elevation of his serum creatinine to a value of 1.75 on 06/06/2010.  The stone had apparently passed and was not seen on repeat CT 06/08/2010.  [name] Tooth pain 10/29/2017  . Urinary straining 11/02/2016    Past Surgical History:  Procedure Laterality Date  . big toe nails removed Bilateral 20 yrs ago  . CARDIOVERSION N/A 06/08/2017  Procedure: CARDIOVERSION;  Surgeon: Josue Hector, MD;  Location: [facility] ENDOSCOPY;  Service: Cardiovascular;  Laterality: N/A;  . CYSTOSCOPY W/ RETROGRADES    . IR RADIOLOGIST EVAL & MGMT  02/02/2018  . LYMPHADENECTOMY Bilateral 10/06/2017   Procedure: LYMPHADENECTOMY;  Surgeon: Lucas Mallow, MD;  Location: [facility] ORS;  Service: Urology;  Laterality: Bilateral;  . ROBOT ASSISTED LAPAROSCOPIC RADICAL PROSTATECTOMY N/A 10/06/2017   Procedure: XI ROBOTIC ASSISTED LAPAROSCOPIC RADICAL PROSTATECTOMY;  Surgeon: Lucas Mallow, MD;  Location: [facility] ORS;  Service: Urology;  Laterality: N/A;  . TRANSURETHRAL  RESECTION OF BLADDER TUMOR N/A 07/13/2017   Procedure: TRANSURETHRAL RESECTION OF PROSTATE;  Surgeon: Lucas Mallow, MD;  Location: [facility] ORS;  Service: Urology;  Laterality: N/A;    There were no vitals filed for this visit.  Subjective Assessment - 05/02/18 1358    Subjective  He is having endoscopy /colonscopy in Jan.  He saw doctor about Iron level and is taking medications but continues to fatigue.     Pertinent History  PMH: DM,HTN,CHF,CKDIII,prostate CA S/P prostateecomy in may 2019    Limitations  Standing;Walking    How long can you stand comfortably?  1 hour before leg/feet pain    How long can you walk comfortably?  grocery store distance    Patient Stated Goals  get the pain out of my legs/feet    Currently in Pain?  Yes    Pain Score  7     Pain Location  Back    Pain Orientation  Lower    Pain Descriptors / Indicators  Aching    Pain Type  Chronic pain    Pain Onset  More than a month ago    Aggravating Factors   walking too far (>100')    Pain Relieving Factors  heat at therapy      Patient arrived to PT session with significant increase in low back pain with spasms radiating into right leg.  PT applied hot pack to low back while PT educated on benefits & use of rollator walker. PT instructed in proper use of rollator walker including use of brakes, positioning and safety with sitting on seat to rest. Pt ambulated 400' with rollator walker with supervision with improved posture and no increase in low back pain. Pt verbalized that typically can only ambulate 100'-150' before back pain becomes intolerable and when it is acting up like today even less than that. He was pleased with ability to ambulate without increase in back pain.  PT demo how to negotiate ramps & curbs with rollator walker. He return demo understanding with supervision & verbal cues.                            PT Short Term Goals - 04/18/18 1545      PT SHORT TERM GOAL #1   Title   Pt will be I and compliant with HEP. 3 weeks 04/07/18    Baseline  MET 04/18/2018    Status  Achieved      PT SHORT TERM GOAL #2   Title  Pt will improve 5TSTS test to less than 17 sec to show improved endurance. 3 weeks 04/07/18    Baseline  MET 04/18/2018  5X STS 16.02 sec    Status  Achieved        PT Long Term Goals - 03/17/18 1048      PT LONG TERM GOAL #1  Title  Pt will improve 5TSTS to less 14 sec to show improved endurance. 8 weeks 05/12/18    Baseline  19 sec    Status  New      PT LONG TERM GOAL #2   Title  Pt will improve gait speed to at least 4.59f/sec. 8 weeks 05/12/18    Baseline  3.9 ft/sec    Status  New      PT LONG TERM GOAL #3   Title  Pt will be able to hold SLS at least 6 sec on avg to show improved balance. 8 weeks 05/12/18    Baseline  3 sec avg    Status  New      PT LONG TERM GOAL #4   Title  Pt will be able to walk up/down stairs 26 reciprocally using one handrail. 8 weeks 05/12/18    Baseline  has to do stairs one at a time in step to pattern due to Rt leg weakness.    Status  New            Plan - 05/02/18 1836    Clinical Impression Statement  Patient reports no increase in back pain with gait with rollator walker and was able to triple distance that he can tolerate. Patient requesting PT to seek getting a prescription for rollator walker for tall adult    Rehab Potential  Good    PT Frequency  1x / week    PT Duration  8 weeks    PT Treatment/Interventions  Electrical Stimulation;Moist Heat;Gait training;Stair training;Therapeutic activities;Therapeutic exercise;Balance training;Neuromuscular re-education;Manual techniques    PT Next Visit Plan  check LTGs    PT Home Exercise Plan  [id number] (self massage to calf, feet with tennis ball)    Recommended Other Services  PT requested rollator walker prescription    Consulted and Agree with Plan of Care  Patient       Patient will benefit from skilled therapeutic intervention in order to  improve the following deficits and impairments:  Abnormal gait, Decreased activity tolerance, Decreased endurance, Decreased strength, Pain, Postural dysfunction, Difficulty walking, Decreased balance  Visit Diagnosis: Muscle weakness (generalized)  Other abnormalities of gait and mobility     Problem List Patient Active Problem List   Diagnosis Date Noted  . Iron deficiency anemia 04/28/2018  . Diastasis recti 04/28/2018  . Abdominal hernia 03/10/2018  . Stage 3 chronic kidney disease ([place] 02/11/2018  . Healthcare maintenance 02/11/2018  . Physical deconditioning 02/11/2018  . Microcytosis 02/11/2018  . Pelvic abscess in male ([facility] 01/14/2018  . SBO (small bowel obstruction) ([place] 01/13/2018  . Stress incontinence of urine 01/06/2018  . Candidiasis of perineum 01/05/2018  . Pain due to dental caries 11/09/2017  . Prostate cancer ([place] 10/06/2017  . Bladder mass 07/13/2017  . Persistent atrial fibrillation   . Vitamin D deficiency 06/22/2016  . Neuropathy in diabetes ([place] 10/07/2015  . Erectile dysfunction 04/17/2014  . Allergic rhinitis 10/19/2012  . Diabetic retinopathy ([place] 10/04/2012  . BPH   . Type 2 diabetes mellitus with peripheral neuropathy ([place] 04/08/2008  . Obstructive sleep apnea   . Morbid obesity ([place]   . Hyperlipidemia associated with type 2 diabetes mellitus ([place]   . Essential hypertension   . Chronic combined systolic and diastolic CHF (congestive heart failure) ([place] 03/01/2006    Shaylene Paganelli PT, DPT 05/03/2018, 6:41 PM  [id number] [address]Phone: [phone]  Fax:  [phone]  Name: Kylin Dubs MRN: [id number] Date of Birth: [birth year]

## 2018-05-04 ENCOUNTER — Other Ambulatory Visit: Payer: Self-pay | Admitting: Internal Medicine

## 2018-05-04 ENCOUNTER — Ambulatory Visit: Payer: Self-pay | Admitting: Dietician

## 2018-05-04 DIAGNOSIS — M549 Dorsalgia, unspecified: Secondary | ICD-10-CM

## 2018-05-11 ENCOUNTER — Ambulatory Visit: Payer: Medicaid Other | Admitting: Physical Therapy

## 2018-05-15 ENCOUNTER — Telehealth: Payer: Self-pay

## 2018-05-15 NOTE — Telephone Encounter (Signed)
Spoke with Terry Parks this afternoon.  Patient advised to HOLD Xarelto two days prior to  His procedure on  06/06/18 per Dr. Maricela Bo.  Patient verabalized understanding.

## 2018-05-15 NOTE — Telephone Encounter (Signed)
----- Message from Timothy Lasso, RN sent at 05/15/2018  9:31 AM EST ----- Regarding: FW: [facility] patient Terry Parks are you waiting for anti coag response?  See below. ----- Message ----- From: Irving Copas., MD Sent: 05/12/2018   4:34 PM EST To: Timothy Lasso, RN, [name], MD Subject: RE: [facility] patient                             Vahini, Thank you for taking your time and reviewing this patient. I will make a notation for [place] so that we are aware for his upcoming procedure. Thank you. Patty, as an update please place a record that the patient will be able to come off of his Xarelto for 2-days prior and that we go over that with him at time of his pre-procedure visit. Thank you. Valarie Merino ----- Message ----- From: [name], MD Sent: 05/12/2018   1:55 PM EST To: Irving Copas., MD Subject: RE: [facility] patient                             I spoke to my preceptor Dr. Evette Doffing and we decided that it would be alright for [name] to be off of xarelto 2 days prior to his procedure. His CHADSVASC score is 3. He has not had any recent cva's and he does not have any known coagulation diseases. Thank you!  Vahini ----- Message ----- From: Irving Copas., MD Sent: 05/12/2018  12:14 PM EST To: [name], MD Subject: RE: [facility] patient                             Thanks for reply. Do you and/or your Preceptor prescribe his Anticoagulation? If so do you all feel comfortable with stopping this for 2-days prior to a procedure. If you do not, then we need to find out who prescribes this for him and ask about his ability to come off of it for 2-days. Thanks. Valarie Merino ----- Message ----- From: [name], MD Sent: 05/12/2018  11:44 AM EST To: Irving Copas., MD Subject: RE: [facility] patient                             Hello Dr. Rush Landmark,  [facility] you are doing well. Happy holidays! Thank you for evaluating the above patient for IDA. With regards  to the patient's anticoagulation, he is currently on xarelto 20mg  qd for his atrial fibrillation. Please let me know if you need additional information. Thank you!  Best,  [name] PGY2 Internal Medicine   ----- Message ----- From: Irving Copas., MD Sent: 04/24/2018   3:58 PM EST To: [name], MD Subject: [facility] patient                                 Dr. Maricela Bo, This is the patient that was referred for IDA Microcytic Anemia. We are working through seeing if the patient wants to have an EGD/Colon, however, we will need to understand his Anticoagulation status and he also states that he may need another refill of this (he is not sure if he has this at home or not). Can you give [place] a sense of if you think he can come  off of this for 2-days prior to a procedure if he agrees to this? Thanks.  [name], MD [facility]

## 2018-05-17 DIAGNOSIS — K219 Gastro-esophageal reflux disease without esophagitis: Secondary | ICD-10-CM

## 2018-05-17 HISTORY — DX: Gastro-esophageal reflux disease without esophagitis: K21.9

## 2018-05-31 ENCOUNTER — Other Ambulatory Visit: Payer: Self-pay | Admitting: Internal Medicine

## 2018-05-31 DIAGNOSIS — I4819 Other persistent atrial fibrillation: Secondary | ICD-10-CM

## 2018-05-31 DIAGNOSIS — E1142 Type 2 diabetes mellitus with diabetic polyneuropathy: Secondary | ICD-10-CM

## 2018-05-31 NOTE — Telephone Encounter (Signed)
Next appt scheduled 2/21 with PCP. 

## 2018-06-01 ENCOUNTER — Encounter: Payer: Self-pay | Admitting: Gastroenterology

## 2018-06-06 ENCOUNTER — Ambulatory Visit (AMBULATORY_SURGERY_CENTER): Payer: Medicaid Other | Admitting: Gastroenterology

## 2018-06-06 ENCOUNTER — Encounter: Payer: Self-pay | Admitting: Gastroenterology

## 2018-06-06 VITALS — BP 109/84 | HR 80 | Temp 98.0°F | Resp 18 | Ht 73.0 in | Wt 292.0 lb

## 2018-06-06 DIAGNOSIS — D509 Iron deficiency anemia, unspecified: Secondary | ICD-10-CM | POA: Diagnosis not present

## 2018-06-06 DIAGNOSIS — Z1211 Encounter for screening for malignant neoplasm of colon: Secondary | ICD-10-CM | POA: Diagnosis not present

## 2018-06-06 DIAGNOSIS — K2950 Unspecified chronic gastritis without bleeding: Secondary | ICD-10-CM | POA: Diagnosis not present

## 2018-06-06 DIAGNOSIS — K209 Esophagitis, unspecified without bleeding: Secondary | ICD-10-CM

## 2018-06-06 DIAGNOSIS — K2970 Gastritis, unspecified, without bleeding: Secondary | ICD-10-CM | POA: Diagnosis not present

## 2018-06-06 DIAGNOSIS — K219 Gastro-esophageal reflux disease without esophagitis: Secondary | ICD-10-CM | POA: Diagnosis not present

## 2018-06-06 MED ORDER — INSULIN REGULAR HUMAN 100 UNIT/ML IJ SOLN
5.0000 [IU] | Freq: Once | INTRAMUSCULAR | Status: AC
  Administered 2018-06-06: 5 [IU] via SUBCUTANEOUS

## 2018-06-06 MED ORDER — SODIUM CHLORIDE 0.9 % IV SOLN: 500.0000 mL | Freq: Once | INTRAVENOUS | Status: DC

## 2018-06-06 MED ORDER — OMEPRAZOLE 40 MG PO CPDR: 40.0000 mg | DELAYED_RELEASE_CAPSULE | Freq: Two times a day (BID) | ORAL | 3 refills | Status: DC

## 2018-06-06 MED FILL — OMEPRAZOLE 40 MG CPDR: 40 | 30 days supply | Qty: 60 | Fill #0

## 2018-06-06 NOTE — Progress Notes (Signed)
Pt's blood sugar on arrival- 336.  He states he is "feeling fine.  I sometimes run in the 400's"  Dr. Rush Landmark notified and order received to give 5 units of Novolin R SQ once.  Medication verified with Lucina Mellow RN and insulin given to upper right arm SQ  Rechecked at 1140- 278

## 2018-06-06 NOTE — Op Note (Signed)
[facility] Patient Name: Moe Graca Procedure Date: 06/06/2018 11:46 AM MRN: [id number] Endoscopist: [name] , MD Age: 63 Referring MD:  Date of Birth: [birth year] Gender: Male Account #: [id number] Procedure:                Colonoscopy Indications:              Iron deficiency Medicines:                Monitored Anesthesia Care Procedure:                Pre-Anesthesia Assessment:                           - Prior to the procedure, a History and Physical                            was performed, and patient medications and                            allergies were reviewed. The patient's tolerance of                            previous anesthesia was also reviewed. The risks                            and benefits of the procedure and the sedation                            options and risks were discussed with the patient.                            All questions were answered, and informed consent                            was obtained. Prior Anticoagulants: The patient has                            taken Xarelto (rivaroxaban), last dose was 3 weeks                            prior to procedure. ASA Grade Assessment: III - A                            patient with severe systemic disease. After                            reviewing the risks and benefits, the patient was                            deemed in satisfactory condition to undergo the                            procedure.  After obtaining informed consent, the colonoscope                            was passed under direct vision. Throughout the                            procedure, the patient's blood pressure, pulse, and                            oxygen saturations were monitored continuously. The                            Model CF-HQ190L [id number]) scope was introduced                            through the anus and advanced to the 5 cm into the           ileum. The colonoscopy was performed without                            difficulty. The patient tolerated the procedure.                            The quality of the bowel preparation was evaluated                            using the BBPS [facility] Bowel Preparation Scale)                            with scores of: Right Colon = 2 (minor amount of                            residual staining, small fragments of stool and/or                            opaque liquid, but mucosa seen well), Transverse                            Colon = 2 (minor amount of residual staining, small                            fragments of stool and/or opaque liquid, but mucosa                            seen well) and Left Colon = 2 (minor amount of                            residual staining, small fragments of stool and/or                            opaque liquid, but mucosa seen well). The total  BBPS score equals 6. The quality of the bowel                            preparation was inadequate. Scope In: 12:05:02 PM Scope Out: 12:18:58 PM Scope Withdrawal Time: 0 hours 9 minutes 53 seconds  Total Procedure Duration: 0 hours 13 minutes 56 seconds  Findings:                 The digital rectal exam findings include                            non-thrombosed internal hemorrhoids. Pertinent                            negatives include no palpable rectal lesions.                           The terminal ileum and ileocecal valve appeared                            normal.                           Copious quantities of semi-liquid stool was found                            in the entire colon, interfering with                            visualization. Lavage of the area was performed                            using copious amounts, resulting in incomplete                            clearance with continued poor visualization.                           Non-bleeding non-thrombosed  internal hemorrhoids                            were found during retroflexion, during perianal                            exam and during digital exam. The hemorrhoids were                            Grade II (internal hemorrhoids that prolapse but                            reduce spontaneously). Complications:            No immediate complications. Estimated Blood Loss:     Estimated blood loss: none. Impression:               - Preparation of the colon was inadequate. Copious  lavage did not allow for fair preparation.                           - Non-thrombosed internal hemorrhoids found on                            digital rectal exam.                           - The examined portion of the ileum was normal.                           - Non-bleeding non-thrombosed internal hemorrhoids. Recommendation:           - The patient will be observed post-procedure,                            until all discharge criteria are met.                           - Discharge patient to home.                           - Patient has a contact number available for                            emergencies. The signs and symptoms of potential                            delayed complications were discussed with the                            patient. Return to normal activities tomorrow.                            Written discharge instructions were provided to the                            patient.                           - High fiber diet.                           - Use fiber, for example Citrucel, Fibercon, Konsyl                            or Metamucil.                           - Miralax 1 capful (17 grams) in 8 ounces of water                            PO daily.                           - Repeat  colonoscopy within next 1 year because the                            bowel preparation was poor and inadequate for true                            screening purposes.                            - The findings and recommendations were discussed                            with the patient.                           - The findings and recommendations were discussed                            with the patient's family. [name], MD 06/06/2018 12:41:27 PM

## 2018-06-06 NOTE — Progress Notes (Signed)
A and O x3. Report to RN. Tolerated MAC anesthesia well.Teeth unchanged after procedure.

## 2018-06-06 NOTE — Patient Instructions (Signed)
YOU HAD AN ENDOSCOPIC PROCEDURE TODAY AT [facility]:   Refer to the procedure report that was given to you for any specific questions about what was found during the examination.  If the procedure report does not answer your questions, please call your gastroenterologist to clarify.  If you requested that your care partner not be given the details of your procedure findings, then the procedure report has been included in a sealed envelope for you to review at your convenience later.  YOU SHOULD EXPECT: Some feelings of bloating in the abdomen. Passage of more gas than usual.  Walking can help get rid of the air that was put into your GI tract during the procedure and reduce the bloating. If you had a lower endoscopy (such as a colonoscopy or flexible sigmoidoscopy) you may notice spotting of blood in your stool or on the toilet paper. If you underwent a bowel prep for your procedure, you may not have a normal bowel movement for a few days.  Please Note:  You might notice some irritation and congestion in your nose or some drainage.  This is from the oxygen used during your procedure.  There is no need for concern and it should clear up in a day or so.  SYMPTOMS TO REPORT IMMEDIATELY:   Following lower endoscopy (colonoscopy or flexible sigmoidoscopy):  Excessive amounts of blood in the stool  Significant tenderness or worsening of abdominal pains  Swelling of the abdomen that is new, acute  Fever of 100F or higher   Following upper endoscopy (EGD)  Vomiting of blood or coffee ground material  New chest pain or pain under the shoulder blades  Painful or persistently difficult swallowing  New shortness of breath  Fever of 100F or higher  Black, tarry-looking stools  For urgent or emergent issues, a gastroenterologist can be reached at any hour by calling [phone].   DIET:  We do recommend a small meal at first, but then you may proceed to your regular diet.  Drink  plenty of fluids but you should avoid alcoholic beverages for 24 hours.  ACTIVITY:  You should plan to take it easy for the rest of today and you should NOT DRIVE or use heavy machinery until tomorrow (because of the sedation medicines used during the test).    FOLLOW UP: Our staff will call the number listed on your records the next business day following your procedure to check on you and address any questions or concerns that you may have regarding the information given to you following your procedure. If we do not reach you, we will leave a message.  However, if you are feeling well and you are not experiencing any problems, there is no need to return our call.  We will assume that you have returned to your regular daily activities without incident.  If any biopsies were taken you will be contacted by phone or by letter within the next 1-3 weeks.  Please call us at [phone] if you have not heard about the biopsies in 3 weeks.    SIGNATURES/CONFIDENTIALITY: You and/or your care partner have signed paperwork which will be entered into your electronic medical record.  These signatures attest to the fact that that the information above on your After Visit Summary has been reviewed and is understood.  Full responsibility of the confidentiality of this discharge information lies with you and/or your care-partner.  Prilosec 40 mg. Twice daily.  High fiber diet.  Use additional fiber like citrucel, fiber con, Konsyl or Metamucil.  Use Miralax one capful in 8 ounces of water daily.  Esophagitis and gastritis information given.   Repeat upper endoscopy in 3 months,  Recall colonoscopy 1 year due to poor prep.

## 2018-06-06 NOTE — Progress Notes (Signed)
Called to room to assist during endoscopic procedure.  Patient ID and intended procedure confirmed with present staff. Received instructions for my participation in the procedure from the performing physician.  

## 2018-06-06 NOTE — Op Note (Signed)
[facility] Patient Name: Terry Parks Procedure Date: 06/06/2018 11:47 AM MRN: [id number] Endoscopist: [name] , MD Age: 63 Referring MD:  Date of Birth: 01-13-1956 Gender: Male Account #: [id number] Procedure:                Upper GI endoscopy Indications:              Iron deficiency anemia Medicines:                Monitored Anesthesia Care Procedure:                Pre-Anesthesia Assessment:                           - Prior to the procedure, a History and Physical                            was performed, and patient medications and                            allergies were reviewed. The patient's tolerance of                            previous anesthesia was also reviewed. The risks                            and benefits of the procedure and the sedation                            options and risks were discussed with the patient.                            All questions were answered, and informed consent                            was obtained. Prior Anticoagulants: The patient has                            taken Xarelto (rivaroxaban), last dose was 3 weeks                            prior to procedure. ASA Grade Assessment: III - A                            patient with severe systemic disease. After                            reviewing the risks and benefits, the patient was                            deemed in satisfactory condition to undergo the                            procedure.  After obtaining informed consent, the endoscope was                            passed under direct vision. Throughout the                            procedure, the patient's blood pressure, pulse, and                            oxygen saturations were monitored continuously. The                            Endoscope was introduced through the mouth, and                            advanced to the second part of duodenum. The upper                     GI endoscopy was accomplished without difficulty.                            The patient tolerated the procedure. Scope In: Scope Out: Findings:                 LA Grade D (one or more mucosal breaks involving at                            least 75% of esophageal circumference) esophagitis                            with no bleeding was found in the middle and distal                            esophagus (28 cm - 41 cm). Biopsies were taken with                            a cold forceps for histology.                           The Z-line was irregular and was found 41 cm from                            the incisors.                           Scattered mild inflammation characterized by                            erosions, erythema and granularity was found in the                            gastric body.                           Otherwise, no gross lesions were noted in the  entire examined stomach. Biopsies were taken with a                            cold forceps for histology and Helicobacter pylori                            testing from the antrum/incisura, greater                            curve/lesser curve.                           No gross lesions were noted in the duodenal bulb,                            in the first portion of the duodenum and in the                            second portion of the duodenum. Biopsies for                            histology were taken with a cold forceps for                            evaluation of celiac disease. Complications:            No immediate complications. Estimated Blood Loss:     Estimated blood loss was minimal. Impression:               - LA Grade D esophagitis. Biopsied.                           - Z-line irregular, 41 cm from the incisors.                           - Mild gastritis. No other gross lesions in the                            stomach. Biopsied for HP.                            - No gross lesions in the duodenal bulb, in the                            first portion of the duodenum and in the second                            portion of the duodenum. Biopsied. Recommendation:           - Proceed to scheduled colonoscopy.                           - Await pathology results.                           -  Start Omeprazole 40 mg BID.                           - Repeat upper endoscopy in 3 months to check                            healing.                           - The findings and recommendations were discussed                            with the patient.                           - The findings and recommendations were discussed                            with the patient's family. [name], MD 06/06/2018 12:36:40 PM

## 2018-06-07 ENCOUNTER — Telehealth: Payer: Self-pay

## 2018-06-07 NOTE — Telephone Encounter (Signed)
  Follow up Call-  Call back number 06/06/2018 06/06/2018  Post procedure Call Back phone  # [phone]  Permission to leave phone message Yes Yes  Some recent data might be hidden     Patient questions:  Do you have a fever, pain , or abdominal swelling? No. Pain Score  0 *  Have you tolerated food without any problems? Yes.    Have you been able to return to your normal activities? Yes.    Do you have any questions about your discharge instructions: Diet   No. Medications  No. Follow up visit  No.  Do you have questions or concerns about your Care? No.  Actions: * If pain score is 4 or above: No action needed, pain <4.

## 2018-06-12 ENCOUNTER — Encounter: Payer: Self-pay | Admitting: Gastroenterology

## 2018-06-14 ENCOUNTER — Telehealth: Payer: Self-pay | Admitting: Dietician

## 2018-06-14 NOTE — Telephone Encounter (Signed)
Terry Parks says he finally Got medications, did not get insulin or test strips, no fluid pills. Has been out for at least 2 weeks.  ? Need another prescription for torosemide?  Will ask Dr. Maudie Mercury for help. He agreed to call [facility] to ask why he did not get insulin, strips.

## 2018-06-14 NOTE — Telephone Encounter (Signed)
Call made to patient at his request-pt again states that he did not get his torsemide, test strips, and Basaglar.  States he has be out for about 2wks, CMA stressed the importance of not missing doses.  Pt states he now has medicaid, pt informed that [facility] may have to call in rx to local pharmacy if this issue with [facility] is not resolved.  Pt will await call back from [place]1/29/20204:57 PM  Pt also informed that we may need to call in for a new meter and supplies, now that he has medicaid.

## 2018-06-14 NOTE — Telephone Encounter (Signed)
Thank you, Ulis Rias! Now that he has Medicaid, he can no longer go to [facility]. I can work on transferring all prescriptions to [facility].

## 2018-06-15 NOTE — Telephone Encounter (Signed)
I am happy to assist with testing supply prescriptions as needed.

## 2018-06-20 ENCOUNTER — Other Ambulatory Visit: Payer: Self-pay | Admitting: *Deleted

## 2018-06-20 DIAGNOSIS — E1142 Type 2 diabetes mellitus with diabetic polyneuropathy: Secondary | ICD-10-CM

## 2018-06-20 DIAGNOSIS — I4819 Other persistent atrial fibrillation: Secondary | ICD-10-CM

## 2018-06-21 ENCOUNTER — Other Ambulatory Visit: Payer: Self-pay | Admitting: Internal Medicine

## 2018-06-21 ENCOUNTER — Other Ambulatory Visit: Payer: Self-pay | Admitting: Pharmacist

## 2018-06-21 ENCOUNTER — Telehealth: Payer: Self-pay | Admitting: *Deleted

## 2018-06-21 DIAGNOSIS — R079 Chest pain, unspecified: Secondary | ICD-10-CM

## 2018-06-21 DIAGNOSIS — I5042 Chronic combined systolic (congestive) and diastolic (congestive) heart failure: Secondary | ICD-10-CM

## 2018-06-21 DIAGNOSIS — I4819 Other persistent atrial fibrillation: Secondary | ICD-10-CM

## 2018-06-21 DIAGNOSIS — E1169 Type 2 diabetes mellitus with other specified complication: Secondary | ICD-10-CM

## 2018-06-21 DIAGNOSIS — E1142 Type 2 diabetes mellitus with diabetic polyneuropathy: Secondary | ICD-10-CM

## 2018-06-21 DIAGNOSIS — E785 Hyperlipidemia, unspecified: Secondary | ICD-10-CM

## 2018-06-21 DIAGNOSIS — I10 Essential (primary) hypertension: Secondary | ICD-10-CM

## 2018-06-21 MED ORDER — TORSEMIDE 20 MG PO TABS: 40.0000 mg | ORAL_TABLET | Freq: Two times a day (BID) | ORAL | 1 refills | Status: DC

## 2018-06-21 MED ORDER — INSULIN GLARGINE 100 UNIT/ML SOLOSTAR PEN: 60.0000 [IU] | PEN_INJECTOR | Freq: Every day | SUBCUTANEOUS | 11 refills | Status: DC

## 2018-06-21 MED ORDER — GLUCOSE BLOOD VI STRP: ORAL_STRIP | 1 refills | Status: DC

## 2018-06-21 MED ORDER — ISOSORBIDE MONONITRATE ER 30 MG PO TB24: 30.0000 mg | ORAL_TABLET | Freq: Every day | ORAL | 3 refills | Status: DC

## 2018-06-21 MED ORDER — AMIODARONE HCL 200 MG PO TABS: 200.0000 mg | ORAL_TABLET | Freq: Every day | ORAL | 1 refills | Status: DC

## 2018-06-21 MED ORDER — ATORVASTATIN CALCIUM 40 MG PO TABS: 40.0000 mg | ORAL_TABLET | Freq: Every day | ORAL | 0 refills | Status: DC

## 2018-06-21 MED ORDER — METFORMIN HCL ER 500 MG PO TB24: 1000.0000 mg | ORAL_TABLET | Freq: Two times a day (BID) | ORAL | 11 refills | Status: DC

## 2018-06-21 MED ORDER — ACCU-CHEK GUIDE W/DEVICE KIT: 1.0000 | PACK | Freq: Three times a day (TID) | 0 refills | Status: DC

## 2018-06-21 MED ORDER — GABAPENTIN 300 MG PO CAPS: 300.0000 mg | ORAL_CAPSULE | Freq: Every day | ORAL | 0 refills | Status: DC

## 2018-06-21 MED ORDER — RIVAROXABAN 20 MG PO TABS: 20.0000 mg | ORAL_TABLET | Freq: Every day | ORAL | 3 refills | Status: DC

## 2018-06-21 MED ORDER — CARVEDILOL 12.5 MG PO TABS: 12.5000 mg | ORAL_TABLET | Freq: Two times a day (BID) | ORAL | 3 refills | Status: DC

## 2018-06-21 MED ORDER — INSULIN PEN NEEDLE 31G X 6 MM MISC: 1.0000 "pen " | Freq: Every day | 3 refills | Status: DC

## 2018-06-21 MED ORDER — BASAGLAR KWIKPEN 100 UNIT/ML ~~LOC~~ SOPN: PEN_INJECTOR | SUBCUTANEOUS | 3 refills | Status: DC

## 2018-06-21 MED ORDER — DULOXETINE HCL 60 MG PO CPEP: 60.0000 mg | ORAL_CAPSULE | Freq: Every day | ORAL | 3 refills | Status: DC

## 2018-06-21 MED ORDER — LOSARTAN POTASSIUM 50 MG PO TABS: 50.0000 mg | ORAL_TABLET | Freq: Every day | ORAL | 3 refills | Status: DC

## 2018-06-21 MED ORDER — AMLODIPINE BESYLATE 5 MG PO TABS: 5.0000 mg | ORAL_TABLET | Freq: Every day | ORAL | 2 refills | Status: DC

## 2018-06-21 MED FILL — GABAPENTIN 300 MG CAPSULE: 300 | 30 days supply | Qty: 30 | Fill #0

## 2018-06-21 MED FILL — ISOSORBIDE MN ER 30 MG TAB: 30 | 30 days supply | Qty: 30 | Fill #0

## 2018-06-21 MED FILL — AMLODIPINE BESYLATE 5 MG TA: 5 | 30 days supply | Qty: 30 | Fill #0

## 2018-06-21 MED FILL — UNIFINE PENTIPS 6MM 31G: 31G X 6 MM | 90 days supply | Qty: 100 | Fill #0 | Status: TO

## 2018-06-21 MED FILL — ATORVASTATIN CALCIUM 40 MG: 40 | 30 days supply | Qty: 30 | Fill #0

## 2018-06-21 MED FILL — ACCU-CHEK AVIVA PLUS TEST S: 33 days supply | Qty: 100 | Fill #0

## 2018-06-21 MED FILL — LOSARTAN POTASSIUM 50 MG TA: 50 | 30 days supply | Qty: 30 | Fill #0

## 2018-06-21 MED FILL — CARVEDILOL 12.5 MG TABLET: 12.5 | 30 days supply | Qty: 60 | Fill #0

## 2018-06-21 MED FILL — LANTUS SOLOSTAR 100 UNITS/M: 100 | 30 days supply | Qty: 18 | Fill #0

## 2018-06-21 MED FILL — XARELTO 20 MG TABLET: 20 | 30 days supply | Qty: 30 | Fill #0

## 2018-06-21 MED FILL — ACCU-CHEK SOFTCLIX LANCETS: 33 days supply | Qty: 100 | Fill #0

## 2018-06-21 MED FILL — AMIODARONE HCL 200 MG TAB: 200 | 30 days supply | Qty: 30 | Fill #0

## 2018-06-21 MED FILL — metFORMIN HCL ER 500 MG TB2: 500 | 30 days supply | Qty: 120 | Fill #0

## 2018-06-21 MED FILL — DULoxetine HCL 60 MG CPEP: 60 | 30 days supply | Qty: 30 | Fill #0

## 2018-06-21 MED FILL — TORSEMIDE 20 MG TABLET: 20 | 30 days supply | Qty: 120 | Fill #0

## 2018-06-21 NOTE — Progress Notes (Signed)
Transferred prescriptions from [facility] pharmacy to [place] due to patient having Medicaid and no longer having access to [facility] pharmacy

## 2018-06-21 NOTE — Telephone Encounter (Signed)
Medicaid will not cover current insulin, can we change to solostar lantus? Pt states he has been using solostar anyway

## 2018-06-22 ENCOUNTER — Other Ambulatory Visit: Payer: Self-pay | Admitting: *Deleted

## 2018-06-22 NOTE — Telephone Encounter (Signed)
Outpatient clinic called me yesterday 06/21/18 and I gave approval for solstar lantus

## 2018-06-23 MED ORDER — GLUCOSE BLOOD VI STRP: ORAL_STRIP | 1 refills | Status: DC

## 2018-06-23 MED ORDER — ACCU-CHEK AVIVA CONNECT W/DEVICE KIT: 1.0000 | PACK | Freq: Three times a day (TID) | 0 refills | Status: DC

## 2018-07-07 ENCOUNTER — Other Ambulatory Visit: Payer: Self-pay

## 2018-07-07 ENCOUNTER — Encounter: Payer: Self-pay | Admitting: Internal Medicine

## 2018-07-07 ENCOUNTER — Ambulatory Visit (INDEPENDENT_AMBULATORY_CARE_PROVIDER_SITE_OTHER): Payer: Medicaid Other | Admitting: Internal Medicine

## 2018-07-07 VITALS — BP 179/107 | HR 90 | Temp 97.6°F | Ht 73.0 in | Wt 285.8 lb

## 2018-07-07 DIAGNOSIS — I4819 Other persistent atrial fibrillation: Secondary | ICD-10-CM | POA: Diagnosis not present

## 2018-07-07 DIAGNOSIS — Z23 Encounter for immunization: Secondary | ICD-10-CM | POA: Diagnosis not present

## 2018-07-07 DIAGNOSIS — G4733 Obstructive sleep apnea (adult) (pediatric): Secondary | ICD-10-CM | POA: Diagnosis not present

## 2018-07-07 DIAGNOSIS — I110 Hypertensive heart disease with heart failure: Secondary | ICD-10-CM | POA: Diagnosis not present

## 2018-07-07 DIAGNOSIS — Z6837 Body mass index (BMI) 37.0-37.9, adult: Secondary | ICD-10-CM | POA: Diagnosis not present

## 2018-07-07 DIAGNOSIS — R634 Abnormal weight loss: Secondary | ICD-10-CM

## 2018-07-07 DIAGNOSIS — E1142 Type 2 diabetes mellitus with diabetic polyneuropathy: Secondary | ICD-10-CM | POA: Diagnosis not present

## 2018-07-07 DIAGNOSIS — I5042 Chronic combined systolic (congestive) and diastolic (congestive) heart failure: Secondary | ICD-10-CM

## 2018-07-07 DIAGNOSIS — Z79899 Other long term (current) drug therapy: Secondary | ICD-10-CM

## 2018-07-07 DIAGNOSIS — E1165 Type 2 diabetes mellitus with hyperglycemia: Secondary | ICD-10-CM

## 2018-07-07 DIAGNOSIS — Z794 Long term (current) use of insulin: Secondary | ICD-10-CM | POA: Diagnosis not present

## 2018-07-07 DIAGNOSIS — R12 Heartburn: Secondary | ICD-10-CM | POA: Diagnosis not present

## 2018-07-07 DIAGNOSIS — Z9114 Patient's other noncompliance with medication regimen: Secondary | ICD-10-CM | POA: Diagnosis not present

## 2018-07-07 DIAGNOSIS — I10 Essential (primary) hypertension: Secondary | ICD-10-CM

## 2018-07-07 DIAGNOSIS — Z0000 Encounter for general adult medical examination without abnormal findings: Secondary | ICD-10-CM

## 2018-07-07 DIAGNOSIS — R21 Rash and other nonspecific skin eruption: Secondary | ICD-10-CM

## 2018-07-07 LAB — BASIC METABOLIC PANEL
Anion gap: 8 (ref 5–15)
BUN: 9 mg/dL (ref 6–20)
CHLORIDE: 100 mmol/L (ref 98–111)
CO2: 24 mmol/L (ref 22–32)
CREATININE: 1.19 mg/dL (ref 0.61–1.24)
Calcium: 8.9 mg/dL (ref 8.9–10.3)
GFR calc Af Amer: >60 mL/min (ref >60)
GFR calc non Af Amer: >60 mL/min (ref >60)
Glucose, Bld: 425 mg/dL — ABNORMAL HIGH (ref 70–99)
Potassium: 4.2 mmol/L (ref 3.5–5.1)
SODIUM: 132 mmol/L — AB (ref 135–145)

## 2018-07-07 LAB — POCT GLYCOSYLATED HEMOGLOBIN (HGB A1C): HbA1c POC (<> result, manual entry): >14 % — AB (ref 4.0–5.6)

## 2018-07-07 LAB — GLUCOSE, CAPILLARY: Glucose-Capillary: 395 mg/dL — ABNORMAL HIGH (ref 70–99)

## 2018-07-07 MED ORDER — METFORMIN HCL ER (OSM) 1000 MG PO TB24: 2000.0000 mg | ORAL_TABLET | Freq: Every day | ORAL | 1 refills | Status: DC

## 2018-07-07 MED ORDER — HYDROCORTISONE 0.5 % EX CREA: 1.0000 "application " | TOPICAL_CREAM | Freq: Two times a day (BID) | CUTANEOUS | 0 refills | Status: DC

## 2018-07-07 NOTE — Progress Notes (Signed)
CC: Diabetes Mellitus follow up  HPI:  [name] is a 63 y.o. chronic combined systolic and diastolic heart failure, essential htn, persistent atrial fibrillation, dm 2, osa, who presents for diabetes follow up. Please see problem based charting for evaluation, assessment, and plan.  Past Medical History:  Diagnosis Date  . Abscessed tooth    top back large cavity no pain or drainage, one on bottom  pt pulled tooth 4-5 months ago, right top large hole in tooth  . AKI (acute kidney injury) ([place])   . Allergic rhinitis   . Anemia   . Anxiety   . Asthma   . Atrial fibrillation ([place])   . BPH (benign prostatic hypertrophy)    Massive BPH noted on cystoscopy 1/23/ 2012 by Dr. Risa Grill.  . Cancer [facility])    prostate cancer 2019  . Cardiomyopathy ([place])   . CHF (congestive heart failure) ([place])   . Cough 03/30/2012  . Depression   . Diabetes mellitus 04/08/2008   type 2  . Foley catheter in place 07-05-17 placed  . Headache(784.0)    hx migraines none recent  . Hyperlipemia   . Hypertension   . Hypertensive cardiopathy 03/01/2006   2-D echocardiogram 02/01/2012 showed moderate LVH, mildly to moderately reduced left ventricular systolic function with an estimated ejection fraction of 40-45%, and diffuse hypokinesis.  A nuclear medicine stress study done 01/31/2012 showed no reversible ischemia, a small mid anterior wall fixed defect/infarct, and ejection fraction 42%.      . Neck pain   . Nephrolithiasis 05/29/2010   CT scan of abdomen/pelvis on 05/29/2010 showed an obstructing approximate 1-2 mm calculus at the left UVJ, and an approximate 1-2 mm left lower pole renal calculus.   Patient had continuing severe pain , and an elevation of his serum creatinine to a value of 1.75 on 06/06/2010.  Patient underwent cystoscopy on 06/08/2010 by Dr. Risa Grill, but attempts at retrograde pyelogram and ureteroscopy were unsucc  . Numbness 01/08/2018  . Obstructive sleep apnea 03/06/2008   Sleep study  03/06/08 showed severe OSA/hypopnea syndrome, with successful CPAP titration to 13 CWP using a medium ResMed Mirage Quattro full face mask with heated humidifier.   . Rash 04/17/2014  . Renal calculus 05/29/2010   CT scan of abdomen/pelvis on 05/29/2010 showed an obstructing approximate 1-2 mm calculus at the left UVJ, and an approximate 1-2 mm left lower pole renal calculus.   Patient had continuing severe pain , and an elevation of his serum creatinine to a value of 1.75 on 06/06/2010.  The stone had apparently passed and was not seen on repeat CT 06/08/2010.  . Sleep apnea    haven't use cpap in 2 years  . Tooth pain 10/29/2017  . Urinary straining 11/02/2016   Review of Systems:    Review of Systems  Constitutional: Positive for weight loss. Negative for chills and fever.  Respiratory: Negative for shortness of breath.   Cardiovascular: Negative for chest pain.  Gastrointestinal: Positive for heartburn. Negative for abdominal pain, diarrhea, nausea and vomiting.  Neurological: Negative for dizziness and headaches.  Psychiatric/Behavioral: Negative for depression.   Physical Exam: Vitals:   07/07/18 1523  BP: (!) 169/103  Pulse: 94  Temp: 97.6 F (36.4 C)  TempSrc: Oral  SpO2: 100%  Weight: 285 lb 12.8 oz (129.6 kg)  Height: 6\' 1"  (1.854 m)   Physical Exam  Constitutional: Appears well-developed and well-nourished. No distress.  HENT:  Head: Normocephalic and atraumatic.  Eyes: Conjunctivae are normal.  Cardiovascular: irregularly irregular rhythm, Normal rate, and normal heart sounds.  Respiratory: Effort normal and breath sounds normal. No respiratory distress. No wheezes.  GI: Soft. Bowel sounds are normal. No distension. There is no tenderness.  Musculoskeletal: No edema.  Neurological: Is alert.  Skin: Not diaphoretic. No erythema. Hyperpigmented oval lesions that are raised Psychiatric: Normal mood and affect. Behavior is normal. Judgment and thought content normal.       Assessment & Plan:   See Encounters Tab for problem based charting.  Patient discussed with Dr. Evette Doffing

## 2018-07-07 NOTE — Assessment & Plan Note (Addendum)
The patient's last a1c=8.5 in November 2019. The patient is currently supposed to be taking lantus 60u qd, metformin 1000mg  bid .  The patient states that he has been off of his insulin for 2 weeks. He could not tell me a reason why he has missed his medication. He has lost 7lbs in the past month.   During this encounter the patient's a1c was >14 and he states that he has been having blurry vision and increased numbness in his feet. He has been eating and drinking without difficulty.  Assessment and plan  Stat bmp did not show anion gap elevation. Therefore, will treat hyperglycemia as follows:  -Instructed to resume Lantus 60u nightly  -changed metformin 1000mg  bid to 2000mg  in the monring  -Follow [facility] in 1 [facility] -Make appointment with Dr. Maudie Mercury for pill packs

## 2018-07-07 NOTE — Assessment & Plan Note (Signed)
The patient does not appear to be volume overloaded on exam.   -Continue torsemide 40mg  bid.

## 2018-07-07 NOTE — Assessment & Plan Note (Addendum)
Patient got  Influenza, pneumococcal 13, and tdap vaccination during this encounter

## 2018-07-07 NOTE — Patient Instructions (Addendum)
It was a pleasure to see you today Mr. Torok. Please make the following changes:  -Please make sure that you continue taking your blood pressure medication: losartan 50mg  qd, imdur 30mg  qd, carvedilol 12.5mg  bid -Diabetes medication: lantus 60units nightly, take 2 tablets of metformin 1000mg  every morning -continue your remaining medication -Follow up with me in 1 month -Follow up with Dr. Maudie Mercury -Follow up with Urology  If you have any questions or concerns, please call our clinic at [phone] between 9am-5pm and after hours call [phone] and ask for the internal medicine resident on call. If you feel you are having a medical emergency please call 911.   Thank you, we look forward to help you remain healthy!  [name], MD Internal Medicine PGY2

## 2018-07-07 NOTE — Assessment & Plan Note (Signed)
The patient's blood pressure during this visit was 169/103 and repeat was 179/107. The patient is currently taking losartan 50mg  qd, imdur 30mg  qd, losartan 50mg  qd. His last blood pressure visits are  BP Readings from Last 3 Encounters:  07/07/18 (!) 179/107  06/06/18 109/84  04/24/18 (!) 178/100   Assessment and Plan -continue losartan 50mg  qd, imdur 30mg  qd, carvedilol 12.5mg  bid -Will arrange appointment with Dr. Maudie Mercury to setup pill pack

## 2018-07-07 NOTE — Assessment & Plan Note (Addendum)
Patient has several oval, raised, hyperpigmented lesions that are tender to touch over his bilateral lower extremities that started 4 months ago. The patient has not had any insect or animal bites.   Assessment and plan  Prescribed hydrocortisone ointment and will follow up in 1 month.

## 2018-07-07 NOTE — Assessment & Plan Note (Signed)
The patient has irregularly irregular rhythm with normal rate during encounter. He should be continued on rate, rhythm control along with anticoagulation. Continue on xarelto 20mg  qd and carvedilol 12.5mg  bid, and amiodarone 200mg  qd.

## 2018-07-07 NOTE — Addendum Note (Signed)
Addended by: Ebbie Latus on: 07/07/2018 05:01 PM   Modules accepted: Orders

## 2018-07-10 NOTE — Progress Notes (Signed)
Internal Medicine Clinic Attending  Case discussed with Dr. [name] at the time of the visit.  We reviewed the resident's history and exam and pertinent patient test results.  I agree with the assessment, diagnosis, and plan of care documented in the resident's note. 

## 2018-07-19 ENCOUNTER — Ambulatory Visit: Payer: Self-pay | Admitting: Pharmacist

## 2018-07-20 ENCOUNTER — Ambulatory Visit: Payer: Medicaid Other | Admitting: Pharmacist

## 2018-07-20 DIAGNOSIS — E1142 Type 2 diabetes mellitus with diabetic polyneuropathy: Secondary | ICD-10-CM

## 2018-07-20 MED ORDER — GLUCOSE BLOOD VI STRP: ORAL_STRIP | 11 refills | Status: DC

## 2018-07-20 MED ORDER — ACCU-CHEK AVIVA DEVI: 0 refills | Status: DC

## 2018-07-20 NOTE — Progress Notes (Signed)
S: Terry Parks is a 63 y.o. male reports to clinical pharmacist appointment for Medication Management Review. Patient did bring medication bottles. Patient is accompanied by self who does his own medication management at home.  Allergies  Allergen Reactions  . Lisinopril Cough    Current Outpatient Medications:  .  acetaminophen (TYLENOL) 325 MG tablet, Take 325-650 mg by mouth every 6 (six) hours as needed (for pain)., Disp: , Rfl:  .  amiodarone (PACERONE) 200 MG tablet, Take 1 tablet (200 mg total) by mouth daily., Disp: 90 tablet, Rfl: 1 .  amLODipine (NORVASC) 5 MG tablet, Take 1 tablet (5 mg total) by mouth daily., Disp: 30 tablet, Rfl: 2 .  atorvastatin (LIPITOR) 40 MG tablet, Take 1 tablet (40 mg total) by mouth daily., Disp: 90 tablet, Rfl: 0 .  Blood Glucose Monitoring Suppl (ACCU-CHEK AVIVA CONNECT) w/Device KIT, 1 Device by Does not apply route 3 (three) times daily. Use to check blood sugar 3 times daily. diag code E11.42. insulin dependent, Disp: 1 kit, Rfl: 0 .  carvedilol (COREG) 12.5 MG tablet, Take 1 tablet (12.5 mg total) by mouth 2 (two) times daily with a meal for 30 days., Disp: 180 tablet, Rfl: 3 .  DULoxetine (CYMBALTA) 60 MG capsule, Take 1 capsule (60 mg total) by mouth daily., Disp: 90 capsule, Rfl: 3 .  ferrous sulfate 325 (65 FE) MG EC tablet, Take 1 tablet (325 mg total) by mouth daily., Disp: 90 tablet, Rfl: 0 .  gabapentin (NEURONTIN) 300 MG capsule, Take 1 capsule (300 mg total) by mouth daily., Disp: 30 capsule, Rfl: 0 .  glucose blood (ACCU-CHEK AVIVA) test strip, Use to check blood sugar 3 times daily diag code E11.42. insulin dependent, Disp: 300 each, Rfl: 1 .  hydrocortisone cream 0.5 %, Apply 1 application topically 2 (two) times daily., Disp: 30 g, Rfl: 0 .  Insulin Glargine (LANTUS SOLOSTAR) 100 UNIT/ML Solostar Pen, Inject 60 Units into the skin daily., Disp: 6 pen, Rfl: 11 .  Insulin Pen Needle (CARETOUCH PEN NEEDLES) 31G X 6 MM MISC, 1 pen by  Does not apply route at bedtime., Disp: 100 each, Rfl: 3 .  isosorbide mononitrate (IMDUR) 30 MG 24 hr tablet, Take 1 tablet (30 mg total) by mouth daily., Disp: 90 tablet, Rfl: 3 .  Lancets MISC, Use to test blood sugar three times a day before meals., Disp: 100 each, Rfl: 1 .  losartan (COZAAR) 50 MG tablet, Take 1 tablet (50 mg total) by mouth daily., Disp: 90 tablet, Rfl: 3 .  metformin (FORTAMET) 1000 MG (OSM) 24 hr tablet, Take 2 tablets (2,000 mg total) by mouth daily with breakfast., Disp: 180 tablet, Rfl: 1 .  Misc. Devices MISC, by Does not apply route. C-PAP, Disp: , Rfl:  .  nystatin cream (MYCOSTATIN), Apply 1 application topically See admin instructions. Apply to arms and legs 2 times a day, Disp: 30 g, Rfl: 1 .  omeprazole (PRILOSEC) 40 MG capsule, Take 1 capsule (40 mg total) by mouth 2 (two) times daily., Disp: 60 capsule, Rfl: 3 .  polyethylene glycol (MIRALAX / GLYCOLAX) packet, Take 17 g by mouth daily., Disp: 14 each, Rfl: 0 .  rivaroxaban (XARELTO) 20 MG TABS tablet, Take 1 tablet (20 mg total) by mouth daily with breakfast., Disp: 90 tablet, Rfl: 3 .  torsemide (DEMADEX) 20 MG tablet, Take 2 tablets (40 mg total) by mouth 2 (two) times daily., Disp: 360 tablet, Rfl: 1  Past Medical History:  Diagnosis Date  .  Abscessed tooth    top back large cavity no pain or drainage, one on bottom  pt pulled tooth 4-5 months ago, right top large hole in tooth  . AKI (acute kidney injury) ([place])   . Allergic rhinitis   . Anemia   . Anxiety   . Asthma   . Atrial fibrillation ([place])   . BPH (benign prostatic hypertrophy)    Massive BPH noted on cystoscopy 1/23/ 2012 by Dr. Risa Grill.  . Cancer [facility])    prostate cancer 2019  . Cardiomyopathy ([place])   . CHF (congestive heart failure) ([place])   . Cough 03/30/2012  . Depression   . Diabetes mellitus 04/08/2008   type 2  . Foley catheter in place 07-05-17 placed  . Headache(784.0)    hx migraines none recent  . Hyperlipemia   .  Hypertension   . Hypertensive cardiopathy 03/01/2006   2-D echocardiogram 02/01/2012 showed moderate LVH, mildly to moderately reduced left ventricular systolic function with an estimated ejection fraction of 40-45%, and diffuse hypokinesis.  A nuclear medicine stress study done 01/31/2012 showed no reversible ischemia, a small mid anterior wall fixed defect/infarct, and ejection fraction 42%.      . Neck pain   . Nephrolithiasis 05/29/2010   CT scan of abdomen/pelvis on 05/29/2010 showed an obstructing approximate 1-2 mm calculus at the left UVJ, and an approximate 1-2 mm left lower pole renal calculus.   Patient had continuing severe pain , and an elevation of his serum creatinine to a value of 1.75 on 06/06/2010.  Patient underwent cystoscopy on 06/08/2010 by Dr. Risa Grill, but attempts at retrograde pyelogram and ureteroscopy were unsucc  . Numbness 01/08/2018  . Obstructive sleep apnea 03/06/2008   Sleep study 03/06/08 showed severe OSA/hypopnea syndrome, with successful CPAP titration to 13 CWP using a medium ResMed Mirage Quattro full face mask with heated humidifier.   . Rash 04/17/2014  . Renal calculus 05/29/2010   CT scan of abdomen/pelvis on 05/29/2010 showed an obstructing approximate 1-2 mm calculus at the left UVJ, and an approximate 1-2 mm left lower pole renal calculus.   Patient had continuing severe pain , and an elevation of his serum creatinine to a value of 1.75 on 06/06/2010.  The stone had apparently passed and was not seen on repeat CT 06/08/2010.  . Sleep apnea    haven't use cpap in 2 years  . Tooth pain 10/29/2017  . Urinary straining 11/02/2016   Social History   Socioeconomic History  . Marital status: Single    Spouse name: Not on file  . Number of children: 4 b  . Years of education: 61  . Highest education level: Not on file  Occupational History  . Occupation:        Employer: UNEMPLOYED  Social Needs  . Financial resource strain: Not on file  . Food insecurity:     Worry: Not on file    Inability: Not on file  . Transportation needs:    Medical: Not on file    Non-medical: Not on file  Tobacco Use  . Smoking status: Never Smoker  . Smokeless tobacco: Never Used  Substance and Sexual Activity  . Alcohol use: No    Alcohol/week: 0.0 standard drinks  . Drug use: No  . Sexual activity: Not Currently  Lifestyle  . Physical activity:    Days per week: Not on file    Minutes per session: Not on file  . Stress: Not on file  Relationships  .  Social connections:    Talks on phone: Not on file    Gets together: Not on file    Attends religious service: Not on file    Active member of club or organization: Not on file    Attends meetings of clubs or organizations: Not on file    Relationship status: Not on file  Other Topics Concern  . Not on file  Social History Narrative   Divorced, 4 children, lives alone.     Family History  Problem Relation Age of Onset  . Breast cancer Mother   . Hypertension Father   . Diabetes Maternal Grandmother   . Colon cancer Neg Hx   . Prostate cancer Neg Hx   . Heart attack Neg Hx   . Esophageal cancer Neg Hx   . Inflammatory bowel disease Neg Hx   . Liver disease Neg Hx   . Pancreatic cancer Neg Hx   . Rectal cancer Neg Hx   . Stomach cancer Neg Hx     O:    Component Value Date/Time   CHOL 101 01/09/2018 0509   CHOL 130 08/22/2017 1151   HDL 26 (L) 01/09/2018 0509   HDL 39 (L) 08/22/2017 1151   LDLCALC 64 01/09/2018 0509   LDLCALC 75 08/22/2017 1151   TRIG 57 01/09/2018 0509   GLUCOSE 425 (H) 07/07/2018 1610   HGBA1C >14.0 (A) 07/07/2018 1533   NA 132 (L) 07/07/2018 1610   NA 135 02/13/2018 1626   K 4.2 07/07/2018 1610   CL 100 07/07/2018 1610   CO2 24 07/07/2018 1610   BUN 9 07/07/2018 1610   BUN 12 02/13/2018 1626   CREATININE 1.19 07/07/2018 1610   CREATININE 1.08 07/18/2014 1056   CALCIUM 8.9 07/07/2018 1610   GFRNONAA >60 07/07/2018 1610   GFRNONAA 76 07/18/2014 1056   GFRAA >60  07/07/2018 1610   GFRAA 88 07/18/2014 1056   AST 21 02/13/2018 1626   ALT 31 02/13/2018 1626   WBC 6.9 04/24/2018 1645   HGB 13.0 04/24/2018 1645   HGB 9.1 (L) 02/08/2018 1054   HCT 40.2 04/24/2018 1645   HCT 29.0 (L) 02/08/2018 1054   PLT 181.0 04/24/2018 1645   PLT 176 02/08/2018 1054   TSH 1.468 01/11/2018 0859   TSH 0.289 (L) 01/31/2012 1056   Ht Readings from Last 2 Encounters:  07/07/18 '6\' 1"'  (1.854 m)  06/06/18 '6\' 1"'  (1.854 m)   Wt Readings from Last 2 Encounters:  07/07/18 285 lb 12.8 oz (129.6 kg)  06/06/18 292 lb (132.5 kg)   There is no height or weight on file to calculate BMI. BP Readings from Last 3 Encounters:  07/07/18 (!) 179/107  06/06/18 109/84  04/24/18 (!) 178/100     A/P: A drug regimen assessment was performed, including review of allergies, interactions, disease-state management, dosing and immunization history. Medications were reviewed with the patient, including name, instructions, indication, goals of therapy, potential side effects, importance of adherence, and safe use.  Findings/Recommendations:  - Patient Terry Parks brought in a multitude of medications various bags. Over 40 different bottles of his medications were identified. These include medications which have been refilled at multiple pharmacies, including [facility] outpatient pharmacy.   - Patients medications which were identified as needing refills were: gabapentin, torsemide, and blood glucose test strips.   - Patient stated he was taking twice as much of his Cymbalta to combat his leg nerve pain; patient was instructed that he was already on the max  dose and not to increase Cymbalta. Patient understood that and confirmed he would not increase it anymore.  - Patient stated he was not always taking his torsemide as directed; Patient was instructed to continue his torsemide as directed on the bottle as this is important for his fluid control. Patient understood that and confirmed  he would not miss it anymore.   An after visit summary was provided and patient advised to follow up in 2 weeks or sooner if any changes in condition or questions regarding medications arise.   The patient verbalized understanding of information provided by repeating back concepts discussed.    Thank you for allowing pharmacy to be a part of this patient's care.  Tamela Gammon, PharmD 07/20/2018 5:06 PM PGY-1 Pharmacy Resident Direct Phone: [phone] Please check [email or web address] for unit-specific pharmacist phone numbers

## 2018-07-24 ENCOUNTER — Encounter: Payer: Self-pay | Admitting: Physical Therapy

## 2018-07-24 NOTE — Therapy (Signed)
[facility] [address] [place], [place], [address] Phone: [phone]   Fax:  [phone]  Patient Details  Name: Terry Parks MRN: [id number] Date of Birth: July 26, 1955 Referring Provider:  [name], MD resident,  Murriel Hopper, MD  Encounter Date: 07/24/2018  PHYSICAL THERAPY DISCHARGE SUMMARY  Visits from Start of Care: 5  Current functional level related to goals / functional outcomes: See last PT note on 05/02/2018   Remaining deficits: See last PT note on 05/02/2018   Education / Equipment: HEP  Plan: Patient agrees to discharge.  Patient goals were not met. Patient is being discharged due to not returning since the last visit.  ?????        Kjell Brannen PT, DPT 07/24/2018, 1:01 PM  [facility] [address] [place], [place], [address] Phone: [phone]   Fax:  [phone]

## 2018-07-27 ENCOUNTER — Ambulatory Visit (INDEPENDENT_AMBULATORY_CARE_PROVIDER_SITE_OTHER): Payer: Medicaid Other | Admitting: Internal Medicine

## 2018-07-27 ENCOUNTER — Other Ambulatory Visit: Payer: Self-pay

## 2018-07-27 ENCOUNTER — Ambulatory Visit: Payer: Medicaid Other | Admitting: Dietician

## 2018-07-27 ENCOUNTER — Encounter: Payer: Self-pay | Admitting: Dietician

## 2018-07-27 VITALS — BP 158/91 | HR 89 | Temp 97.8°F | Wt 291.0 lb

## 2018-07-27 DIAGNOSIS — E1142 Type 2 diabetes mellitus with diabetic polyneuropathy: Secondary | ICD-10-CM | POA: Diagnosis not present

## 2018-07-27 DIAGNOSIS — R079 Chest pain, unspecified: Secondary | ICD-10-CM | POA: Diagnosis not present

## 2018-07-27 DIAGNOSIS — I4819 Other persistent atrial fibrillation: Secondary | ICD-10-CM

## 2018-07-27 DIAGNOSIS — K0889 Other specified disorders of teeth and supporting structures: Secondary | ICD-10-CM

## 2018-07-27 DIAGNOSIS — I110 Hypertensive heart disease with heart failure: Secondary | ICD-10-CM

## 2018-07-27 DIAGNOSIS — G4733 Obstructive sleep apnea (adult) (pediatric): Secondary | ICD-10-CM

## 2018-07-27 DIAGNOSIS — Z79899 Other long term (current) drug therapy: Secondary | ICD-10-CM | POA: Diagnosis not present

## 2018-07-27 DIAGNOSIS — Z794 Long term (current) use of insulin: Secondary | ICD-10-CM

## 2018-07-27 DIAGNOSIS — I5042 Chronic combined systolic (congestive) and diastolic (congestive) heart failure: Secondary | ICD-10-CM | POA: Diagnosis not present

## 2018-07-27 MED ORDER — GABAPENTIN 300 MG PO CAPS: 300.0000 mg | ORAL_CAPSULE | Freq: Two times a day (BID) | ORAL | 0 refills | Status: DC

## 2018-07-27 MED ORDER — TORSEMIDE 20 MG PO TABS: 40.0000 mg | ORAL_TABLET | Freq: Two times a day (BID) | ORAL | 1 refills | Status: DC

## 2018-07-27 MED FILL — GABAPENTIN 300 MG CAPSULE: 300 | 15 days supply | Qty: 30 | Fill #0

## 2018-07-27 MED FILL — TORSEMIDE 20 MG TABLET: 20 | 30 days supply | Qty: 120 | Fill #0

## 2018-07-27 NOTE — Progress Notes (Signed)
Met with Terry Parks in the room with his doctor today. I answered his questions and concerns about his glucometer. I observed him check his blood sugar with his own device withy minimal assist. The result was in the 300s. He then spoke about changes is is thinking about making and changes he is currently working to improve his lifestyle and eating habits. Support provided. Coordinated care with Dr. Maricela Bo. Terry Parks has 10 hours of Diabetes Self Management Training covered by [facility] Medicaid. I suggest he follow up to begin that in about 4 weeks. Debera Lat, RD 07/27/2018 1:49 PM.

## 2018-07-27 NOTE — Assessment & Plan Note (Signed)
Patient continues to have burning and numbness in his bilateral feet. He is currently taking cymbalta 60mg  qd and gabapentin 300mg  qd.   Assessment and plan  Will increase gabapentin to 300mg  twice daily

## 2018-07-27 NOTE — Progress Notes (Signed)
CC: Diabetes Follow up  HPI:  [name] is a 63 y.o. male with essential htn, chronic combined hf, osa, dm2 who presents for diabetes follow up. Please see problem based charting for evaluation, assessment, and plan.  Past Medical History:  Diagnosis Date   Abscessed tooth    top back large cavity no pain or drainage, one on bottom  pt pulled tooth 4-5 months ago, right top large hole in tooth   AKI (acute kidney injury) ([place])    Allergic rhinitis    Anemia    Anxiety    Asthma    Atrial fibrillation (H[facility])    BPH (benign prostatic hypertrophy)    Massive BPH noted on cystoscopy 1/23/ 2012 by Dr. Risa Grill.   Cancer [facility])    prostate cancer 2019   Cardiomyopathy [facility])    CHF (congestive heart failure) ([place])    Cough 03/30/2012   Depression    Diabetes mellitus 04/08/2008   type 2   Foley catheter in place 07-05-17 placed   Headache(784.0)    hx migraines none recent   Hyperlipemia    Hypertension    Hypertensive cardiopathy 03/01/2006   2-D echocardiogram 02/01/2012 showed moderate LVH, mildly to moderately reduced left ventricular systolic function with an estimated ejection fraction of 40-45%, and diffuse hypokinesis.  A nuclear medicine stress study done 01/31/2012 showed no reversible ischemia, a small mid anterior wall fixed defect/infarct, and ejection fraction 42%.       Neck pain    Nephrolithiasis 05/29/2010   CT scan of abdomen/pelvis on 05/29/2010 showed an obstructing approximate 1-2 mm calculus at the left UVJ, and an approximate 1-2 mm left lower pole renal calculus.   Patient had continuing severe pain , and an elevation of his serum creatinine to a value of 1.75 on 06/06/2010.  Patient underwent cystoscopy on 06/08/2010 by Dr. Risa Grill, but attempts at retrograde pyelogram and ureteroscopy were unsucc   Numbness 01/08/2018   Obstructive sleep apnea 03/06/2008   Sleep study 03/06/08 showed severe OSA/hypopnea syndrome, with successful CPAP  titration to 13 CWP using a medium ResMed Mirage Quattro full face mask with heated humidifier.    Rash 04/17/2014   Renal calculus 05/29/2010   CT scan of abdomen/pelvis on 05/29/2010 showed an obstructing approximate 1-2 mm calculus at the left UVJ, and an approximate 1-2 mm left lower pole renal calculus.   Patient had continuing severe pain , and an elevation of his serum creatinine to a value of 1.75 on 06/06/2010.  The stone had apparently passed and was not seen on repeat CT 06/08/2010.   Sleep apnea    haven't use cpap in 2 years   Tooth pain 10/29/2017   Urinary straining 11/02/2016   Review of Systems:    Review of Systems  Constitutional: Negative for chills, fever, malaise/fatigue and weight loss.  HENT:       Tooth pain  Respiratory: Negative for cough.   Cardiovascular: Positive for chest pain. Negative for leg swelling.  Gastrointestinal: Negative for abdominal pain, nausea and vomiting.  Neurological: Negative for dizziness and headaches.  Psychiatric/Behavioral: Nervous/anxious:     Physical Exam:  Vitals:   07/27/18 1147  BP: (!) 158/91  Pulse: 89  Temp: 97.8 F (36.6 C)  Weight: 291 lb (132 kg)    Physical Exam  Constitutional: Appears well-developed and well-nourished. No distress.  HENT:  Head: Normocephalic and atraumatic.  Eyes: Conjunctivae are normal.  Mouth: no erythema, mild amount of swelling on right side of  mouth Cardiovascular: Normal rate, regular rhythm and normal heart sounds.  Respiratory: Effort normal and breath sounds normal. No respiratory distress. No wheezes.  GI: Soft. Bowel sounds are normal. No distension. There is no tenderness.  Musculoskeletal: No edema.  Neurological: Is alert.  Skin: Not diaphoretic. No erythema.  Psychiatric: Normal mood and affect. Behavior is normal. Judgment and thought content normal.   Assessment & Plan:   See Encounters Tab for problem based charting.  Patient discussed with Dr. Dareen Piano

## 2018-07-27 NOTE — Assessment & Plan Note (Signed)
The patient states that he has re-started taking his 60u of lantus nightly. During visit the patient's blood glucose reading was in 300s. The patient reports the the blood glucose was 600s at home.   Assessment and plan  -Worked with diabetes coordinator on checking blood glucose regularly -Continue lantus 60u nightly  -Follow up with pharmacy and diabetes coordinator monthly

## 2018-07-28 ENCOUNTER — Other Ambulatory Visit: Payer: Self-pay | Admitting: Dietician

## 2018-07-28 DIAGNOSIS — E1142 Type 2 diabetes mellitus with diabetic polyneuropathy: Secondary | ICD-10-CM

## 2018-07-28 MED ORDER — ACCU-CHEK SOFTCLIX LANCETS MISC: 3 refills | Status: DC

## 2018-07-28 NOTE — Progress Notes (Signed)
Internal Medicine Clinic Attending  Case discussed with Dr. [name] at the time of the visit.  We reviewed the resident's history and exam and pertinent patient test results.  I agree with the assessment, diagnosis, and plan of care documented in the resident's note. 

## 2018-07-28 NOTE — Telephone Encounter (Signed)
Needs correct lancets to go with new meter to check his blood sugar.

## 2018-08-03 ENCOUNTER — Ambulatory Visit: Payer: Self-pay | Admitting: Pharmacist

## 2018-08-07 ENCOUNTER — Ambulatory Visit: Payer: Self-pay | Admitting: Pharmacist

## 2018-08-07 ENCOUNTER — Ambulatory Visit: Payer: Self-pay

## 2018-08-11 ENCOUNTER — Other Ambulatory Visit: Payer: Self-pay

## 2018-08-11 ENCOUNTER — Ambulatory Visit (INDEPENDENT_AMBULATORY_CARE_PROVIDER_SITE_OTHER): Payer: Medicaid Other | Admitting: Internal Medicine

## 2018-08-11 DIAGNOSIS — E1142 Type 2 diabetes mellitus with diabetic polyneuropathy: Secondary | ICD-10-CM | POA: Diagnosis not present

## 2018-08-11 NOTE — Progress Notes (Signed)
CC: Diabetes mellitus follow-up  HPI:  [name] is a 63 y.o. with hypertension, chronic combined systolic and diastolic heart failure, atrial fibrillation, type 2 diabetes mellitus who presents for diabetes follow-up. Please see problem based charting for evaluation, assessment, and plan.  This is a telephone encounter between Terry Parks and Terry Parks on 08/11/2018 for diabetes followup. The visit was conducted with the patient located at home and Terry Parks at [facility]. The patient's identity was confirmed using their DOB and current address. The patient has consented to being evaluated through a telephone encounter and understands the associated risks (an examination cannot be done and the patient may need to come in for an appointment) / benefits (allows the patient to remain at home, decreasing exposure to coronavirus). I personally spent 10 minutes on medical discussion.   Past Medical History:  Diagnosis Date   Abscessed tooth    top back large cavity no pain or drainage, one on bottom  pt pulled tooth 4-5 months ago, right top large hole in tooth   AKI (acute kidney injury) ([place])    Allergic rhinitis    Anemia    Anxiety    Asthma    Atrial fibrillation (H[facility])    BPH (benign prostatic hypertrophy)    Massive BPH noted on cystoscopy 1/23/ 2012 by Dr. Risa Grill.   Cancer [facility])    prostate cancer 2019   Cardiomyopathy [facility])    CHF (congestive heart failure) ([place])    Cough 03/30/2012   Depression    Diabetes mellitus 04/08/2008   type 2   Foley catheter in place 07-05-17 placed   Headache(784.0)    hx migraines none recent   Hyperlipemia    Hypertension    Hypertensive cardiopathy 03/01/2006   2-D echocardiogram 02/01/2012 showed moderate LVH, mildly to moderately reduced left ventricular systolic function with an estimated ejection fraction of 40-45%, and diffuse hypokinesis.  A nuclear medicine stress study done 01/31/2012 showed no  reversible ischemia, a small mid anterior wall fixed defect/infarct, and ejection fraction 42%.       Neck pain    Nephrolithiasis 05/29/2010   CT scan of abdomen/pelvis on 05/29/2010 showed an obstructing approximate 1-2 mm calculus at the left UVJ, and an approximate 1-2 mm left lower pole renal calculus.   Patient had continuing severe pain , and an elevation of his serum creatinine to a value of 1.75 on 06/06/2010.  Patient underwent cystoscopy on 06/08/2010 by Dr. Risa Grill, but attempts at retrograde pyelogram and ureteroscopy were unsucc   Numbness 01/08/2018   Obstructive sleep apnea 03/06/2008   Sleep study 03/06/08 showed severe OSA/hypopnea syndrome, with successful CPAP titration to 13 CWP using a medium ResMed Mirage Quattro full face mask with heated humidifier.    Rash 04/17/2014   Renal calculus 05/29/2010   CT scan of abdomen/pelvis on 05/29/2010 showed an obstructing approximate 1-2 mm calculus at the left UVJ, and an approximate 1-2 mm left lower pole renal calculus.   Patient had continuing severe pain , and an elevation of his serum creatinine to a value of 1.75 on 06/06/2010.  The stone had apparently passed and was not seen on repeat CT 06/08/2010.   Sleep apnea    haven't use cpap in 2 years   Tooth pain 10/29/2017   Urinary straining 11/02/2016   Review of Systems:    Review of Systems  Constitutional: Negative for chills and fever.  Respiratory: Negative for cough and shortness of breath.   Gastrointestinal: Negative for  nausea and vomiting.  Neurological: Negative for dizziness and headaches.   Physical Exam:  There were no vitals filed for this visit.   Assessment & Plan:   See Encounters Tab for problem based charting.  Patient discussed with Dr. Lynnae January

## 2018-08-11 NOTE — Assessment & Plan Note (Signed)
The patient's last a1c was >14 in February 2020 due to him stopping taking his medication for one month duratino. The patient states that his blood glucose measurements have been ranging 120-300s. He states that he has been adherent to metformin 2000mg  qd and lantus 60qhs.   Assessment and plan The patient appears to be adherent to his medication regimen currently and his blood glucose range has improved. Will continue metformin 2000 daily and lantus 60u nightly. Will recheck a1c in May 2020 at clinic visit.

## 2018-08-14 NOTE — Progress Notes (Signed)
Internal Medicine Clinic Attending  Case discussed with Dr. [name] at the time of the visit.  We reviewed the resident's history and exam and pertinent patient test results.  I agree with the assessment, diagnosis, and plan of care documented in the resident's note. 

## 2018-08-24 ENCOUNTER — Ambulatory Visit: Payer: Self-pay | Admitting: Pharmacist

## 2018-08-24 ENCOUNTER — Ambulatory Visit: Payer: Medicaid Other | Admitting: Dietician

## 2018-08-24 ENCOUNTER — Other Ambulatory Visit: Payer: Self-pay

## 2018-08-24 ENCOUNTER — Encounter: Payer: Self-pay | Admitting: Dietician

## 2018-08-24 MED FILL — ACCU-CHEK AVIVA PLUS STRP: 66 days supply | Qty: 200 | Fill #0

## 2018-08-24 NOTE — Progress Notes (Signed)
This is the 2nd visit this year for diabetes education/training: Terry Parks is a 63 y.o. male who was contacted on behalf of [facility] nutrition and diabetes services. Telephone visit due to COVID-19.  This is a telephone encounter between Terry Parks and Terry Parks on 08/24/2018 for diabetes education. The visit was conducted with the patient located at home and Terry Parks at [facility]. The patient's identity was confirmed using their DOB and current address. The patient has consented to being evaluated through a telephone encounter and understands the associated risks / benefits (allows the patient to remain at home, decreasing exposure to coronavirus). I personally spent 43 minutes on assessment/education.   Start time: 8:17AM End time:  9:00 AM  Diet:  what meal plan do you follow? Still trying to eat oatmeal, mushrooms, eggs, sausage, orange juice. Oranges, strawberries   tell me about how you drink to care for your diabetes: some water, 2 cases of water, struggling with soda a couple a week, doing better not drinking a 2 liter/day, tea and coffee What times do you eat? mosly  9 am-12 pm, 3-5 pm, 8-9 pm, tries not to snack, does have an occasional payday. Terry Parks   Monitoring:  Are you checking blood sugars at home? 1x in the morning, couple times later just to see What are the results? 93  This am- 2nd time under 100, usually under 150, when he is "binging on drinks" he may see a "HI" one time or a reading in the 300s.  symptoms of high - I feel like that all the time- less than 3 times  It was high  low blood sugar:  hunger sometimes  Medications:  what medications are you taking for diabetes? 9-5-9 schedule is helping, deferred to pharmacist appointment later today  Exercise:  What type of activity are you doing? Goes outside to sit, a little walking, back hurts, weight slowing him down   Sleep: better- not using cpap, teeth preventing him from using it- needs dentist name and  number on [address] What time do you wake up? Up by 7-8 am, up but laying down What time do you go to sleep? Late- naps after eating most times, so then not sleepy, on computer watching netflix How often to wake up during that time? Doing better- fluid pill works good- incontinent, sometimes doesn't take both fluid pills  Labs/Anthropometrics:  Lab Results  Component Value Date   HGBA1C >14.0 (A) 07/07/2018   HGBA1C 8.5 (A) 04/07/2018   HGBA1C 9.9 (H) 01/09/2018   Wt Readings from Last 5 Encounters:  07/27/18 291 lb (132 kg)  07/07/18 285 lb 12.8 oz (129.6 kg)  06/06/18 292 lb (132.5 kg)  04/24/18 292 lb (132.5 kg)  04/07/18 293 lb 9.6 oz (133.2 kg)   Plan:  Date of follow-up visit scheduled: 10/13/18 same day as Dr. Maricela Bo but prior to her visit Any other questions or concerns?" needs more strips, fine with self monitoring now, leg does not hurt, but feet are very swollen, has a hard time elevating legs because of incontinence. Does not want neuropathy medicine Thought therapist would do massage his legg and feet for his poor circulation. He needs massage, wonders if doctors understood.  Send dentist name and number.  PLan to discuss/reassess support system at next visit.   Terry Parks, RD 08/24/2018 8:20 AM.

## 2018-08-24 NOTE — Patient Instructions (Addendum)
Mr. Akram- thank you for speaking with me today on the phone.   Here is some follow up information about what we discussed:   Dentist-  Now that you have insurance, you no longer qualify for the [facility]. Chilon suggested you call  Dr. Trenton Gammon at  [facility] is temporarily closed. You will get refills from [facility] right now. I called them and they are mailing your test strips to [facility]. [address], [address], [place],  [address]  For other refills call them within 7 days of being out:  [place]  Phone: [phone]  Location: [place]   Monday - Friday: 7:30am - 6:00pm  Saturday: 8:00am - 6:00pm   Keep trying to get used to water;  -consider putting seltzer water in your sodas when you drink them to help yourself get used to less sweetness  Good job checking your blood sugars and getting them under better control  Try to move at least 5 minutes every hour. This may help keep your circulation moving and decrease the pain in your feet.  [name] [phone]

## 2018-09-19 ENCOUNTER — Telehealth: Payer: Self-pay

## 2018-09-19 ENCOUNTER — Telehealth: Payer: Self-pay | Admitting: *Deleted

## 2018-09-19 ENCOUNTER — Ambulatory Visit (INDEPENDENT_AMBULATORY_CARE_PROVIDER_SITE_OTHER): Payer: Medicaid Other | Admitting: Internal Medicine

## 2018-09-19 ENCOUNTER — Other Ambulatory Visit: Payer: Self-pay

## 2018-09-19 DIAGNOSIS — I5042 Chronic combined systolic (congestive) and diastolic (congestive) heart failure: Secondary | ICD-10-CM

## 2018-09-19 DIAGNOSIS — M25551 Pain in right hip: Secondary | ICD-10-CM | POA: Diagnosis not present

## 2018-09-19 NOTE — Assessment & Plan Note (Signed)
Location and nature of his pain is more consistent with trochanteric bursitis. No groin pain.  No red flag symptoms.  Advised patient to use ice or heating pad at the affected area. He can try some Tylenol. If his pain does not improve with the next few days he can call the clinic and come for an steroid injection.

## 2018-09-19 NOTE — Telephone Encounter (Signed)
Spoke with him today and addressed his issues.

## 2018-09-19 NOTE — Progress Notes (Signed)
Internal Medicine Clinic Attending  Case discussed with Dr. Amin at the time of the visit.  We reviewed the resident's history and exam and pertinent patient test results.  I agree with the assessment, diagnosis, and plan of care documented in the resident's note.    

## 2018-09-19 NOTE — Telephone Encounter (Signed)
error 

## 2018-09-19 NOTE — Telephone Encounter (Signed)
Call from pt - stated legs/feet are swollen and painful; but not quite as large as last time he was in the hospital. Started about 2-3 weeks ago. Stated he has been soaking and elevating his feet/legs; wanting to know what else he can do?  Pt agreeable to tele health visit - [place] telehealth visit scheduled for today @ 1515 PM.

## 2018-09-19 NOTE — Progress Notes (Signed)
This is a telephone encounter between Terry Parks and Terry Parks on 09/19/2018 for worsening lower extremity edema and hip pain. The visit was conducted with the patient located at home and [place] at [facility]. The patient's identity was confirmed using their DOB and current address. The patient has consented to being evaluated through a telephone encounter and understands the associated risks (an examination cannot be done and the patient may need to come in for an appointment) / benefits (allows the patient to remain at home, decreasing exposure to coronavirus). I personally spent 15 minutes on medical discussion.  CC: Worsening lower extremity edema and right hip pain.  HPI:  [name] is a 63 y.o. with past medical history as listed below was given a call due to his complaint of worsening lower extremity edema and right hip pain.  Lower extremity edema.  Patient was complaining of worsening lower extremity edema and pain in both legs.  He was also experiencing worsening shortness of breath while walking around the house.  These symptoms are going on for about a month.  Before this he was able to walk around the block doubt any difficulty.  Orthopnea and continue to sleep on one pillow.  He denies any chest pain or palpitations.  He does not weigh himself at home.  On questioning he was taking torsemide 20 mg twice daily instead of 40 mg twice daily.  Right hip pain.  He was also complaining of pain on lateral side of right hip for 1 day.  Per patient his lateral side of hip is very tender.  He denies any groin area pain.  Pain is nonradiating.  No tingling or numbness or focal weakness.  He denies any swelling, erythema or hyperthermia in that area.  Patient is unable to lay on that side due to pain.  Please see assessment and plan for his chronic conditions.  Past Medical History:  Diagnosis Date  . Abscessed tooth    top back large cavity no pain or drainage, one on  bottom  pt pulled tooth 4-5 months ago, right top large hole in tooth  . AKI (acute kidney injury) ([place])   . Allergic rhinitis   . Anemia   . Anxiety   . Asthma   . Atrial fibrillation ([place])   . BPH (benign prostatic hypertrophy)    Massive BPH noted on cystoscopy 1/23/ 2012 by Dr. Risa Grill.  . Cancer [facility])    prostate cancer 2019  . Cardiomyopathy ([place])   . CHF (congestive heart failure) ([place])   . Cough 03/30/2012  . Depression   . Diabetes mellitus 04/08/2008   type 2  . Foley catheter in place 07-05-17 placed  . Headache(784.0)    hx migraines none recent  . Hyperlipemia   . Hypertension   . Hypertensive cardiopathy 03/01/2006   2-D echocardiogram 02/01/2012 showed moderate LVH, mildly to moderately reduced left ventricular systolic function with an estimated ejection fraction of 40-45%, and diffuse hypokinesis.  A nuclear medicine stress study done 01/31/2012 showed no reversible ischemia, a small mid anterior wall fixed defect/infarct, and ejection fraction 42%.      . Neck pain   . Nephrolithiasis 05/29/2010   CT scan of abdomen/pelvis on 05/29/2010 showed an obstructing approximate 1-2 mm calculus at the left UVJ, and an approximate 1-2 mm left lower pole renal calculus.   Patient had continuing severe pain , and an elevation of his serum creatinine to a value of 1.75 on 06/06/2010.  Patient underwent cystoscopy on 06/08/2010 by Dr. Risa Grill, but attempts at retrograde pyelogram and ureteroscopy were unsucc  . Numbness 01/08/2018  . Obstructive sleep apnea 03/06/2008   Sleep study 03/06/08 showed severe OSA/hypopnea syndrome, with successful CPAP titration to 13 CWP using a medium ResMed Mirage Quattro full face mask with heated humidifier.   . Rash 04/17/2014  . Renal calculus 05/29/2010   CT scan of abdomen/pelvis on 05/29/2010 showed an obstructing approximate 1-2 mm calculus at the left UVJ, and an approximate 1-2 mm left lower pole renal calculus.   Patient had continuing severe pain ,  and an elevation of his serum creatinine to a value of 1.75 on 06/06/2010.  The stone had apparently passed and was not seen on repeat CT 06/08/2010.  . Sleep apnea    haven't use cpap in 2 years  . Tooth pain 10/29/2017  . Urinary straining 11/02/2016   Review of Systems: Negative except mentioned in HPI.   Assessment & Plan:   See Encounters Tab for problem based charting.  Patient discussed with Dr. Angelia Mould.

## 2018-09-19 NOTE — Assessment & Plan Note (Signed)
His symptoms of worsening lower extremity edema and shortness of breath are more consistent with heart failure. Patient was supposed to take 40 mg of torsemide twice daily, instead he was just taking 1 pill of 20 mg twice daily.  -Advised patient to take 2 pills of 20 mg total of 40 mg twice daily. -Also advised to weigh himself daily and write it down in a notebook.  If he sees a gain of weight of 3 pound in 1 day or 5 pound in 1 week he should take extra 40 mg of torsemide and call the clinic. -If his symptoms started to get worse or if he cannot lay down flat he should go to emergency room.

## 2018-09-20 ENCOUNTER — Telehealth: Payer: Self-pay | Admitting: Pharmacist

## 2018-09-20 DIAGNOSIS — R32 Unspecified urinary incontinence: Secondary | ICD-10-CM | POA: Diagnosis not present

## 2018-09-20 DIAGNOSIS — E1142 Type 2 diabetes mellitus with diabetic polyneuropathy: Secondary | ICD-10-CM

## 2018-09-20 MED ORDER — GABAPENTIN 300 MG PO CAPS: 300.0000 mg | ORAL_CAPSULE | Freq: Two times a day (BID) | ORAL | 3 refills | Status: DC

## 2018-09-20 MED ORDER — LIRAGLUTIDE 18 MG/3ML ~~LOC~~ SOPN: 0.6000 mg | PEN_INJECTOR | Freq: Every day | SUBCUTANEOUS | 3 refills | Status: DC

## 2018-09-20 MED ORDER — VITAMIN D 25 MCG (1000 UNIT) PO TABS: 1000.0000 [IU] | ORAL_TABLET | Freq: Every day | ORAL | 11 refills | Status: DC

## 2018-09-20 MED ORDER — INSULIN GLARGINE 100 UNIT/ML SOLOSTAR PEN: 60.0000 [IU] | PEN_INJECTOR | Freq: Every day | SUBCUTANEOUS | 11 refills | Status: DC

## 2018-09-20 NOTE — Progress Notes (Signed)
Terry Parks is a 63 y.o. male who was contacted on behalf of [facility] Geriatrics Task Force.   Diabetes Assessment  DM meds and BS checks -  "What medications are you taking for diabetes?" metformin 1000 mg BID, lantus 60 units daily -  "How often do you check your blood sugars at home?" once daily before breakfast - "What have your blood sugars been?" 150s (>200s-300s 1-2 times per week, no symptoms reported, no BG < 70)  High Blood Pressure Assessment  BP meds & BP checks -  "What medications are you taking for high blood pressure?" amlodipine 5 mg daily, losartan 50 mg daily, carvedilol 12.5 mg BID, torsemide 40 mg BID (recently started this dose of torsemide) - "How often do you check your blood pressure at home?" not checking  Coping with DM and BP - What else are you doing to help with your DM and BP - working with [name] Plyler  Medication Access Issues  Medication Issues? -  "Are you getting your medicines refilled on time without skipping any doses?" yes - "Are you having any problems getting/taking your meds (cost, timing, transportation)?" no - Do you need any meds refilled? needs gabapentin (15 day supply sent previously), Lantus refills  Conclusion  Close the call - Date of follow-up visit scheduled 09/28/18 - "Any other questions or concerns?" will continue working with patient for DM management (Victoza added per Dr. Maricela Bo approval).

## 2018-09-20 NOTE — Telephone Encounter (Signed)
Terry Parks is a 63 y.o. male who was contacted on behalf of [facility] Geriatrics Task Force.    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 -  "Are you getting your medicines refilled on time without skipping any doses?" yes - "Are you having any problems getting/taking your meds (cost, timing, transportation)?" no - Do you need any meds refilled? lantus  Conclusion  Close the call - Date of follow-up visit scheduled 09/28/18 - "Any other questions or concerns?" no, sometimes lack of appetite, thirsty

## 2018-09-22 MED FILL — LANTUS SOLOSTAR 100 UNITS/M: 100 | 30 days supply | Qty: 18 | Fill #0

## 2018-09-22 MED FILL — GABAPENTIN 300 MG CAPSULE: 300 | 30 days supply | Qty: 60 | Fill #0

## 2018-09-22 MED FILL — VICTOZA 18 MG/3 ML INJECT P: 18 | 30 days supply | Qty: 9 | Fill #0

## 2018-09-22 MED FILL — UNIFINE PENTIPS 6MM 31G: 31G X 6 MM | 90 days supply | Qty: 100 | Fill #0

## 2018-09-26 ENCOUNTER — Encounter: Payer: Self-pay | Admitting: Gastroenterology

## 2018-09-28 ENCOUNTER — Telehealth: Payer: Self-pay

## 2018-09-29 NOTE — Telephone Encounter (Signed)
Patient has not yet picked up Victoza and is running out of Lantus insulin. Prescriptions were sent to [facility] on 09/20/2018. Gave patient phone number and address for [facility]- patient will pick up in the next few days. Will call patient back in 1 week to check to see how he is doing on Victoza.

## 2018-10-09 NOTE — Progress Notes (Deleted)
CC: ***  HPI:  Mr.Manolo Garfinkle is a 63 y.o. male with htn combined heart failure, persistent atrial fibrillation, diabetes mellitus type 2, ckd3, prostate cancer who presents for follow up. Please see problem based charting for evaluation, assessment, and plan.  Heart failure  Patient's last echo in August 2019 showed ef 35-40%, diffuse hypokinesis, g2dd, moderately dilated right atrium, and mildly dilated right ventricle.  The patient had a nuclear stress test one in September 2013 that showeda small mid anterior wall fixed infarct with no reversible ischemia and ef 42%.  The patient is currently taking torsemide 40mg  bid, imdur 30mg  qd, losartan 50mg  qd, and carvedilol 12.5mg  bid.   Assessment and plan   -Spironolactone instead of amlodipine?  -follow with heart failure?   PAF The patient states that he has not been noticing any palpitations. He is to continue on carvedilol 12.5mg  bid and xarelto 20mg  qd.   DM2 The patient's last a1c >14 on February 2020. The patient's home  blood glucose measurements over the past month have ranged ***. The patient does/does not note episodes of hypoglycemia.   The patient is currently taking metformin 2000mg  qd and lantus 60units daily, victoza 0.6mg  qd. The patient is compliant with medication.    Patient's weight changes ***  Assessment and plan   HTN The patient's blood pressure during this visit was ***. The patient is currently taking amlodipine 5mg  qd, losartan 50mg  qd, carvedilol 12.5mg  bid. His last blood pressure visits are   BP Readings from Last 3 Encounters:  07/27/18 (!) 158/91  07/07/18 (!) 179/107  06/06/18 109/84    The patient does/does not *** report palpitations, dizziness, chest pain, sob ***.  Assessment and Plan ***    Past Medical History:  Diagnosis Date  . Abscessed tooth    top back large cavity no pain or drainage, one on bottom  pt pulled tooth 4-5 months ago, right top large hole in tooth  .  AKI (acute kidney injury) ([place])   . Allergic rhinitis   . Anemia   . Anxiety   . Asthma   . Atrial fibrillation ([place])   . BPH (benign prostatic hypertrophy)    Massive BPH noted on cystoscopy 1/23/ 2012 by Dr. Risa Grill.  . Cancer [facility])    prostate cancer 2019  . Cardiomyopathy ([place])   . CHF (congestive heart failure) ([place])   . Cough 03/30/2012  . Depression   . Diabetes mellitus 04/08/2008   type 2  . Foley catheter in place 07-05-17 placed  . Headache(784.0)    hx migraines none recent  . Hyperlipemia   . Hypertension   . Hypertensive cardiopathy 03/01/2006   2-D echocardiogram 02/01/2012 showed moderate LVH, mildly to moderately reduced left ventricular systolic function with an estimated ejection fraction of 40-45%, and diffuse hypokinesis.  A nuclear medicine stress study done 01/31/2012 showed no reversible ischemia, a small mid anterior wall fixed defect/infarct, and ejection fraction 42%.      . Neck pain   . Nephrolithiasis 05/29/2010   CT scan of abdomen/pelvis on 05/29/2010 showed an obstructing approximate 1-2 mm calculus at the left UVJ, and an approximate 1-2 mm left lower pole renal calculus.   Patient had continuing severe pain , and an elevation of his serum creatinine to a value of 1.75 on 06/06/2010.  Patient underwent cystoscopy on 06/08/2010 by Dr. Risa Grill, but attempts at retrograde pyelogram and ureteroscopy were unsucc  . Numbness 01/08/2018  . Obstructive sleep apnea 03/06/2008   Sleep  study 03/06/08 showed severe OSA/hypopnea syndrome, with successful CPAP titration to 13 CWP using a medium ResMed Mirage Quattro full face mask with heated humidifier.   . Rash 04/17/2014  . Renal calculus 05/29/2010   CT scan of abdomen/pelvis on 05/29/2010 showed an obstructing approximate 1-2 mm calculus at the left UVJ, and an approximate 1-2 mm left lower pole renal calculus.   Patient had continuing severe pain , and an elevation of his serum creatinine to a value of 1.75 on 06/06/2010.   The stone had apparently passed and was not seen on repeat CT 06/08/2010.  . Sleep apnea    haven't use cpap in 2 years  . Tooth pain 10/29/2017  . Urinary straining 11/02/2016   Review of Systems:  ***  Physical Exam:  There were no vitals filed for this visit. ***  Assessment & Plan:   See Encounters Tab for problem based charting.  Patient {GC/GE:3044014::"discussed with","seen with"} Dr. {NAMES:[id number]::"Butcher","Granfortuna","E. Hoffman","Klima","Mullen","Narendra","Raines","Vincent"}

## 2018-10-10 ENCOUNTER — Ambulatory Visit (INDEPENDENT_AMBULATORY_CARE_PROVIDER_SITE_OTHER): Payer: Medicaid Other | Admitting: Internal Medicine

## 2018-10-10 ENCOUNTER — Other Ambulatory Visit: Payer: Self-pay

## 2018-10-10 ENCOUNTER — Telehealth: Payer: Self-pay

## 2018-10-10 DIAGNOSIS — C61 Malignant neoplasm of prostate: Secondary | ICD-10-CM | POA: Diagnosis not present

## 2018-10-10 DIAGNOSIS — Z7901 Long term (current) use of anticoagulants: Secondary | ICD-10-CM | POA: Diagnosis not present

## 2018-10-10 DIAGNOSIS — Z79899 Other long term (current) drug therapy: Secondary | ICD-10-CM

## 2018-10-10 DIAGNOSIS — I5042 Chronic combined systolic (congestive) and diastolic (congestive) heart failure: Secondary | ICD-10-CM | POA: Diagnosis not present

## 2018-10-10 DIAGNOSIS — Z7982 Long term (current) use of aspirin: Secondary | ICD-10-CM

## 2018-10-10 DIAGNOSIS — R1114 Bilious vomiting: Secondary | ICD-10-CM

## 2018-10-10 DIAGNOSIS — I110 Hypertensive heart disease with heart failure: Secondary | ICD-10-CM | POA: Diagnosis not present

## 2018-10-10 DIAGNOSIS — I10 Essential (primary) hypertension: Secondary | ICD-10-CM

## 2018-10-10 DIAGNOSIS — E1142 Type 2 diabetes mellitus with diabetic polyneuropathy: Secondary | ICD-10-CM

## 2018-10-10 DIAGNOSIS — Z794 Long term (current) use of insulin: Secondary | ICD-10-CM

## 2018-10-10 DIAGNOSIS — I4819 Other persistent atrial fibrillation: Secondary | ICD-10-CM | POA: Diagnosis not present

## 2018-10-10 MED ORDER — ASPIRIN EC 81 MG PO TBEC: 81.0000 mg | DELAYED_RELEASE_TABLET | Freq: Every day | ORAL | 2 refills | Status: DC

## 2018-10-10 MED ORDER — OMEPRAZOLE 40 MG PO CPDR: 40.0000 mg | DELAYED_RELEASE_CAPSULE | Freq: Every day | ORAL | 0 refills | Status: DC

## 2018-10-10 MED ORDER — SPIRONOLACTONE 25 MG PO TABS: 25.0000 mg | ORAL_TABLET | Freq: Every day | ORAL | 0 refills | Status: DC

## 2018-10-10 MED FILL — OMEPRAZOLE 40 MG CPDR: 40 | 30 days supply | Qty: 30 | Fill #0

## 2018-10-10 MED FILL — SPIRONOLACTONE 25 MG TABLET: 25 | 30 days supply | Qty: 30 | Fill #0

## 2018-10-10 NOTE — Assessment & Plan Note (Signed)
The patient's last a1c >14 on February 2020. The patient's home blood glucose measurements over the past month have ranged 90-150s. The patient does/does not note episodes of hypoglycemia.   The patient is currently taking metformin 2000mg  qd and lantus 60units daily, victoza 0.6mg  qd. The patient is compliant with medication.    Assessment and plan   -continue metformin 2000mg  qd, lantus 60u qd, victoza 0.6mg  qd  -continue following with [name] and Dr. Maudie Mercury

## 2018-10-10 NOTE — Telephone Encounter (Signed)
Called patient to check in to see how he is doing on Victoza- patient started about 1 week ago. Patient endorses nausea and indigestion- will send 30 day prescription of omeprazole since patient was previously on this to help with indigestion. Educated patient that these symptoms should subside in about 1 week.   It was difficult to tell if he had titrated it too quickly (this could worsen the nausea and indigestion)- it seemed like he had only taken 1.2mg  for 1 day then went up to 1.8mg . I told him to continue 1.2mg  for 1 week before titrating up to 1.8mg .   Will call back in 1 week to check in on how he is doing.

## 2018-10-10 NOTE — Assessment & Plan Note (Signed)
Patient had some small sclerotic lesions found in the femur, ischeum, and pubic rami on ct abdomen done in September 2019. I have placed a future order for psa in the chart for the patient to come to [facility] to get blood drawn. Based on the level, I will order follow up tests (If low will order PET or MRI and if high will order bone scan or CT).    -PSA level pending

## 2018-10-10 NOTE — Assessment & Plan Note (Addendum)
Patient's last echo in August 2019 showed ef 35-40%, diffuse hypokinesis, g2dd, moderately dilated right atrium, and mildly dilated right ventricle.  The patient had a nuclear stress test one in September 2013 that showeda small mid anterior wall fixed infarct with no reversible ischemia and ef 42%.  Patient states that he has not been having any peripheral or gut edema. The patient states that he has been having some occasional chest pain. Review of prior ekgs did not show ischemic changes.  The patient is currently taking torsemide 40mg  bid, imdur 30mg  qd, losartan 50mg  qd, and carvedilol 12.5mg  bid.   Assessment and plan  Patient should follow current heart failure regimen. I will add on spironolactone 25mg  qd and aspirin 81mg  qd.   -Patient needs to follow with Cardiology for possible ischemic workup -Spironolactone 25mg  qd added -Asprin 81mg  added

## 2018-10-10 NOTE — Assessment & Plan Note (Signed)
Patient states that he continues to have numbness and tingling in his bilateral feet. He states taht it is present over dorsal and plantar aspect of feet. Patient states that he has been bumping his feet into things due to the numbness. He has been taking gabapentin 300mg  bid.   Assessment and plan  -Increased gabapentin from 300mg  bid to 600mg  bid -Counseled about better glycemic control -DME for Diabetic shoes

## 2018-10-10 NOTE — Assessment & Plan Note (Signed)
Patient's blood pressure at home been ranging 160-180/>100 The patient is currently taking amlodipine 5mg  qd, losartan 50mg  qd, carvedilol 12.5mg  bid. His last blood pressure visits are   BP Readings from Last 3 Encounters:  07/27/18 (!) 158/91  07/07/18 (!) 179/107  06/06/18 109/84   Has been having some shortness of breath.   Assessment and Plan  -continue amlodipine 5mg  qd, losartan 50mg  qd, carvedilol 12.5mg  bid -Added spironolactone 25mg  qd -Follow up in 1 month

## 2018-10-10 NOTE — Assessment & Plan Note (Addendum)
The patient states that he has not been noticing any palpitations. He is to continue on amiodarone 200mg  qd, carvedilol 12.5mg  bid and xarelto 20mg  qd.

## 2018-10-10 NOTE — Progress Notes (Signed)
CC:   This is a telephone encounter between Terry Parks and Rourke Mcquitty on 10/10/2018 for heart failure follow up. The visit was conducted with the patient located at home and Matha Masse at [facility]. The patient's identity was confirmed using their DOB and current address. The patient has consented to being evaluated through a telephone encounter and understands the associated risks (an examination cannot be done and the patient may need to come in for an appointment) / benefits (allows the patient to remain at home, decreasing exposure to coronavirus). I personally spent 30 minutes on medical discussion.   I connected with  Terry Parks on 10/10/18 by a video enabled telemedicine application and verified that I am speaking with the correct person using two identifiers.   I discussed the limitations of evaluation and management by telemedicine. The patient expressed understanding and agreed to proceed.  HPI:  [name] is a 63 y.o. with PMH as below.   Please see A&P for assessment of the patient's acute and chronic medical conditions.   Past Medical History:  Diagnosis Date  . Abscessed tooth    top back large cavity no pain or drainage, one on bottom  pt pulled tooth 4-5 months ago, right top large hole in tooth  . AKI (acute kidney injury) ([place])   . Allergic rhinitis   . Anemia   . Anxiety   . Asthma   . Atrial fibrillation ([place])   . BPH (benign prostatic hypertrophy)    Massive BPH noted on cystoscopy 1/23/ 2012 by Dr. Risa Grill.  . Cancer [facility])    prostate cancer 2019  . Cardiomyopathy ([place])   . CHF (congestive heart failure) ([place])   . Cough 03/30/2012  . Depression   . Diabetes mellitus 04/08/2008   type 2  . Foley catheter in place 07-05-17 placed  . Headache(784.0)    hx migraines none recent  . Hyperlipemia   . Hypertension   . Hypertensive cardiopathy 03/01/2006   2-D echocardiogram 02/01/2012 showed moderate LVH, mildly to moderately reduced left  ventricular systolic function with an estimated ejection fraction of 40-45%, and diffuse hypokinesis.  A nuclear medicine stress study done 01/31/2012 showed no reversible ischemia, a small mid anterior wall fixed defect/infarct, and ejection fraction 42%.      . Neck pain   . Nephrolithiasis 05/29/2010   CT scan of abdomen/pelvis on 05/29/2010 showed an obstructing approximate 1-2 mm calculus at the left UVJ, and an approximate 1-2 mm left lower pole renal calculus.   Patient had continuing severe pain , and an elevation of his serum creatinine to a value of 1.75 on 06/06/2010.  Patient underwent cystoscopy on 06/08/2010 by Dr. Risa Grill, but attempts at retrograde pyelogram and ureteroscopy were unsucc  . Numbness 01/08/2018  . Obstructive sleep apnea 03/06/2008   Sleep study 03/06/08 showed severe OSA/hypopnea syndrome, with successful CPAP titration to 13 CWP using a medium ResMed Mirage Quattro full face mask with heated humidifier.   . Rash 04/17/2014  . Renal calculus 05/29/2010   CT scan of abdomen/pelvis on 05/29/2010 showed an obstructing approximate 1-2 mm calculus at the left UVJ, and an approximate 1-2 mm left lower pole renal calculus.   Patient had continuing severe pain , and an elevation of his serum creatinine to a value of 1.75 on 06/06/2010.  The stone had apparently passed and was not seen on repeat CT 06/08/2010.  . Sleep apnea    haven't use cpap in 2 years  . Tooth pain  10/29/2017  . Urinary straining 11/02/2016   Review of Systems:    Has had some pain in feet along with numbness, nausea, shortness of breath, dizziness  Assessment & Plan:   See Encounters Tab for problem based charting.  Patient discussed with Dr. Lynnae January

## 2018-10-10 NOTE — Progress Notes (Signed)
Internal Medicine Clinic Attending  Case discussed with Dr. [name] at the time of the visit.  We reviewed the resident's history and exam and pertinent patient test results.  I agree with the assessment, diagnosis, and plan of care documented in the resident's note. 

## 2018-10-11 ENCOUNTER — Encounter: Payer: Medicaid Other | Admitting: Internal Medicine

## 2018-10-13 ENCOUNTER — Ambulatory Visit (INDEPENDENT_AMBULATORY_CARE_PROVIDER_SITE_OTHER): Payer: Medicaid Other | Admitting: Dietician

## 2018-10-13 ENCOUNTER — Other Ambulatory Visit: Payer: Self-pay

## 2018-10-13 ENCOUNTER — Encounter: Payer: Self-pay | Admitting: Dietician

## 2018-10-13 ENCOUNTER — Encounter: Payer: Self-pay | Admitting: Internal Medicine

## 2018-10-13 VITALS — BP 151/89 | HR 95 | Wt 308.2 lb

## 2018-10-13 DIAGNOSIS — C61 Malignant neoplasm of prostate: Secondary | ICD-10-CM | POA: Diagnosis not present

## 2018-10-13 DIAGNOSIS — Z713 Dietary counseling and surveillance: Secondary | ICD-10-CM | POA: Diagnosis not present

## 2018-10-13 DIAGNOSIS — Z6841 Body Mass Index (BMI) 40.0 and over, adult: Secondary | ICD-10-CM | POA: Diagnosis not present

## 2018-10-13 DIAGNOSIS — E1142 Type 2 diabetes mellitus with diabetic polyneuropathy: Secondary | ICD-10-CM | POA: Diagnosis not present

## 2018-10-13 LAB — POCT GLYCOSYLATED HEMOGLOBIN (HGB A1C): Hemoglobin A1C: 10.3 % — AB (ref 4.0–5.6)

## 2018-10-13 LAB — GLUCOSE, CAPILLARY: Glucose-Capillary: 100 mg/dL — ABNORMAL HIGH (ref 70–99)

## 2018-10-13 NOTE — Patient Instructions (Addendum)
Call DENTIST so you can wear your CPAP!!!!  :Chilon suggested you call  Dr. Trenton Gammon at  [phone]    Consider eating foods with more B12 and omega 3 fatty acids (EPA and DHA) or you can -salmon, tuna, trout, mackerel,  flax seeds, chia seeds  Iron supplement and B12 okay- look for USP symbol on the supplement to be sure of purity.Terry Parks job lowering your A1C, increasing the victoza should help lower it more.   Also increasing your activity a bit more as able and continue to work on making healthier choices.    Smoothie-  Protein- greek yogort or protein powder Carb- greek yogurt you can 1/4 cup oatmeal Fruit-half a frozen banana with a few berries Veggie- spinach

## 2018-10-13 NOTE — Progress Notes (Signed)
Diabetes Self-Management Education  Visit Type:  Follow-up  Appt. Start Time: 1000 Appt. End Time: 1100  10/13/2018  [name], identified by name and date of birth, is a 63 y.o. male with a diagnosis of Diabetes:  .   ASSESSMENT  [name] was upset about his weight gain.  He checks his blood sugar most in am and average is 183. He is taking 1.2 mg of victoza daily and plans on increasing the victoza to 1.8mg  soon. He reports having had indigestion from the [place] which impeded his self titration.   He needs ophthalmology referral (has not seen eye doctor after retinal camera showed retinopathy with clinically significant edema 10/2017) , recommend podiatry referral (see today's foot exam) and is working on getting an appointment with a dentist to get his teeth fixed so he can wear his CPAP.   He reports better sleep due to getting up less frequently to urinate.   Blood pressure (!) 151/89, pulse 95, weight (!) 308 lb 3.2 oz (139.8 kg), SpO2 98 %. he was not aware of weight gain. Feet/calves did not show significant swelling.   Body mass index is 40.66 kg/m.   Diabetes Self-Management Education - 10/13/18 1400      Health Coping   How would you rate your overall health?  Good      Psychosocial Assessment   Patient Belief/Attitude about Diabetes  Defeat/Burnout   lonely/depressedoffered referral to counselor/he denied need   Self-care barriers  Lack of transportation;Lack of material resources    Self-management support  Doctor's office;Family;CDE visits    Patient Concerns  Monitoring;Glycemic Control    Special Needs  None    Preferred Learning Style  Other (comment)      Complications   Last HgB A1C per patient/outside source  10.3 %    How often do you check your blood sugar?  1-2 times/day    Fasting Blood glucose range (mg/dL)  130-179;180-200    Postprandial Blood glucose range (mg/dL)  >200    Number of hypoglycemic episodes per month  0    Number of  hyperglycemic episodes per week  5    Have you had a dilated eye exam in the past 12 months?  No    Have you had a dental exam in the past 12 months?  No    Are you checking your feet?  Yes      Dietary Intake   Breakfast  egg, sausage, who wheat bread    Dinner  frosted flakes and whole milk    Beverage(s)  water, whole milk, no reg soda x 1 week      Exercise   Exercise Type  ADL's   goes outside 1-2 times a day     Patient Education   Previous Diabetes Education  Yes (please comment)    Nutrition management   Other (comment)   encouraged continued efforts at eating higher nutrient densi   Medications  Reviewed patients medication for diabetes, action, purpose, timing of dose and side effects.    Personal strategies to promote health  Lifestyle issues that need to be addressed for better diabetes care      Individualized Goals (developed by patient)   Reducing Risk  do foot checks daily   call dentist     Patient Self-Evaluation of Goals - Patient rates self as meeting previously set goals (% of time)   Reducing Risk  25 - 50%   he caklled dentist- they were  closed due to covid-19     Outcomes   Program Status  Not Completed      Subsequent Visit   Since your last visit have you continued or begun to take your medications as prescribed?  Yes    Since your last visit have you had your blood pressure checked?  Yes    Is your most recent blood pressure lower, unchanged, or higher since your last visit?  Unchanged   he says it is and has been always high   Since your last visit have you experienced any weight changes?  Gain    Weight Gain (lbs)  17    Since your last visit, are you checking your blood glucose at least once a day?  Yes      Learning Objective:  Patient will have a greater understanding of diabetes self-management. Patient education plan is to attend individual and/or group sessions per assessed needs and concerns.  Plan:   Patient Instructions  Call  DENTIST so you can wear your CPAP!!!!  :Chilon suggested you call  Dr. Trenton Gammon at  [phone]    Consider eating foods with more B12 and omega 3 fatty acids (EPA and DHA) or you can -salmon, tuna, trout, mackerel,  flax seeds, chia seeds  Iron supplement and B12 okay- look for USP symbol on the supplement to be sure of purity.Terry Parks job lowering your A1C, increasing the victoza should help lower it more.   Also increasing your activity a bit more as able and continue to work on making healthier choices.    Smoothie-  Protein- greek yogort or protein powder Carb- greek yogurt you can 1/4 cup oatmeal Fruit-half a frozen banana with a few berries Veggie- spinach   Expected Outcomes:  Demonstrated interest in learning. Expect positive outcomes Education material provided: avs If problems or questions, patient to contact team via:  Phone Future DSME appointment: - 4-6 wks  Debera Lat, RD 10/13/2018 3:00 PM.

## 2018-10-14 LAB — PSA: Prostate Specific Ag, Serum: 0.3 ng/mL (ref 0.0–4.0)

## 2018-10-18 ENCOUNTER — Telehealth: Payer: Self-pay

## 2018-10-18 ENCOUNTER — Other Ambulatory Visit: Payer: Self-pay | Admitting: Internal Medicine

## 2018-10-18 DIAGNOSIS — E1142 Type 2 diabetes mellitus with diabetic polyneuropathy: Secondary | ICD-10-CM

## 2018-10-18 MED ORDER — INSULIN DEGLUDEC-LIRAGLUTIDE 100-3.6 UNIT-MG/ML ~~LOC~~ SOPN: 50.0000 [IU] | PEN_INJECTOR | Freq: Every day | SUBCUTANEOUS | 11 refills | Status: DC

## 2018-10-18 MED ORDER — GABAPENTIN 300 MG PO CAPS: 300.0000 mg | ORAL_CAPSULE | Freq: Two times a day (BID) | ORAL | 3 refills | Status: DC

## 2018-10-18 MED FILL — XULTOPHY 100 UNIT-3.6MG/ML: 100-3.6 | 30 days supply | Qty: 15 | Fill #0

## 2018-10-18 NOTE — Progress Notes (Signed)
Prescription sent to pharmacy for [place] and also notified Regino Schultze and Ulis Rias for potential need for prior auth on Xultophy. If successful obtaining the Xultophy, patient can discontinue Lantus and Victoza.

## 2018-10-18 NOTE — Telephone Encounter (Signed)
Called patient to see how he is doing on Victoza (started about 2 weeks ago). Patient reports improved indigestion and has been taking omeprazole. Patient started 1.8mg  dose of Victoza on Monday June 1. He forgot to take his new pen out of the refrigerator yesterday and missed his dose (he thought he wouldn't be able to take it because it was still cold), but I informed him that he can still inject the Victoza- he can leave it out for 5-10 minutes before injecting so it will hurt less than if he injected it cold. He has not missed any other doses.   He needs a refill on Victoza (only has 1 pen left) and gabapentin (Dr. Maricela Bo increased his dosing to 4x a day and he ran out). We plan to combine his Lantus and Victoza to Xultophy to decrease number of injections, but need to try to push it through his insurance.

## 2018-10-19 ENCOUNTER — Telehealth: Payer: Self-pay | Admitting: *Deleted

## 2018-10-19 NOTE — Telephone Encounter (Addendum)
Information was sent to [employer] for PA for [employer].  Awaiting determination.  Conf # [id number] Nelva Nay, RN 9:55 AM PA was denied call to [place] to find reason for denial.  Patient received a paid claim for the Xultophy on 10/18/2018.  Unsure as to why it was covered.  [place] [id number] System generated . Patient may receive more or it may be denied and patient will need to try the Bydureon or Byetta.  Has already been on Victoza.  [facility] was called and the Xultophy 5 pens has not been picked up.  Will be held for 10 days and then returned.  If returned will have to be resubmitted for payment. Patient was called and advised to pick up as soon as possible within the next 10 days and to try and refill before he completely runs out of medication.  Voiced understanding of plan and will pick up tomorrow.  Sander Nephew, RN 10/19/2018 2:53 PM.

## 2018-10-20 MED FILL — GABAPENTIN 300 MG CAPSULE: 300 | 30 days supply | Qty: 60 | Fill #0

## 2018-10-24 ENCOUNTER — Telehealth: Payer: Self-pay | Admitting: *Deleted

## 2018-10-24 DIAGNOSIS — N5231 Erectile dysfunction following radical prostatectomy: Secondary | ICD-10-CM | POA: Diagnosis not present

## 2018-10-24 DIAGNOSIS — E1142 Type 2 diabetes mellitus with diabetic polyneuropathy: Secondary | ICD-10-CM | POA: Diagnosis not present

## 2018-10-24 DIAGNOSIS — C61 Malignant neoplasm of prostate: Secondary | ICD-10-CM | POA: Diagnosis not present

## 2018-10-24 DIAGNOSIS — N393 Stress incontinence (female) (male): Secondary | ICD-10-CM | POA: Diagnosis not present

## 2018-10-24 NOTE — Telephone Encounter (Signed)
Thank you :)

## 2018-10-24 NOTE — Telephone Encounter (Signed)
Received CMN for diabetic shoes from [employer]. Placed in PCP's box for completion. Hubbard Hartshorn, RN, BSN

## 2018-10-25 ENCOUNTER — Telehealth: Payer: Self-pay

## 2018-10-25 ENCOUNTER — Telehealth: Payer: Self-pay | Admitting: Cardiovascular Disease

## 2018-10-25 ENCOUNTER — Other Ambulatory Visit: Payer: Self-pay | Admitting: Pharmacist

## 2018-10-25 DIAGNOSIS — E1142 Type 2 diabetes mellitus with diabetic polyneuropathy: Secondary | ICD-10-CM

## 2018-10-25 NOTE — Telephone Encounter (Signed)

## 2018-10-25 NOTE — Telephone Encounter (Signed)
Called patient to see if he had started Xultophy. He picked it up, but is waiting until he runs out of his Victoza before he starts it. He will start the [facility] tomorrow. He understands to discontinue his Victoza and Lantus.   His A1c from 10/13/2018 came back as 10.1, which is improved from >14 from 06/2018. Will get BMP and microalbumin when he sees [name] on 11/10/2018. He reports sugars less than 150 in the mornings, which is consistent with a lower A1c. We will get a follow up A1c in about 3 months.   Patient will call if he has any questions or notices elevated sugars.

## 2018-10-25 NOTE — Telephone Encounter (Signed)
smartphone/ consent/ my chart/ pre reg completed °

## 2018-10-25 NOTE — Telephone Encounter (Signed)
New Message   Patient added on for tomorrow appt please call to get setup and consent for virtual visit.

## 2018-10-26 ENCOUNTER — Telehealth: Payer: Self-pay

## 2018-10-26 ENCOUNTER — Telehealth (INDEPENDENT_AMBULATORY_CARE_PROVIDER_SITE_OTHER): Payer: Medicaid Other | Admitting: Cardiovascular Disease

## 2018-10-26 ENCOUNTER — Telehealth: Payer: Self-pay | Admitting: *Deleted

## 2018-10-26 ENCOUNTER — Encounter: Payer: Self-pay | Admitting: Cardiovascular Disease

## 2018-10-26 DIAGNOSIS — I5043 Acute on chronic combined systolic (congestive) and diastolic (congestive) heart failure: Secondary | ICD-10-CM

## 2018-10-26 DIAGNOSIS — I10 Essential (primary) hypertension: Secondary | ICD-10-CM | POA: Diagnosis not present

## 2018-10-26 DIAGNOSIS — E1142 Type 2 diabetes mellitus with diabetic polyneuropathy: Secondary | ICD-10-CM | POA: Diagnosis not present

## 2018-10-26 DIAGNOSIS — N183 Chronic kidney disease, stage 3 unspecified: Secondary | ICD-10-CM

## 2018-10-26 DIAGNOSIS — E1169 Type 2 diabetes mellitus with other specified complication: Secondary | ICD-10-CM | POA: Diagnosis not present

## 2018-10-26 DIAGNOSIS — I4819 Other persistent atrial fibrillation: Secondary | ICD-10-CM | POA: Diagnosis not present

## 2018-10-26 DIAGNOSIS — E785 Hyperlipidemia, unspecified: Secondary | ICD-10-CM

## 2018-10-26 MED ORDER — TORSEMIDE 20 MG PO TABS: 40.0000 mg | ORAL_TABLET | Freq: Two times a day (BID) | ORAL | 2 refills | Status: DC

## 2018-10-26 MED ORDER — SPIRONOLACTONE 25 MG PO TABS: 25.0000 mg | ORAL_TABLET | Freq: Every day | ORAL | 2 refills | Status: DC

## 2018-10-26 MED FILL — TORSEMIDE 20 MG TABLET: 20 | 30 days supply | Qty: 120 | Fill #0

## 2018-10-26 NOTE — Progress Notes (Signed)
Virtual Visit via Video Note   This visit type was conducted due to national recommendations for restrictions regarding the COVID-19 Pandemic (e.g. social distancing) in an effort to limit this patient's exposure and mitigate transmission in our community.  Due to his co-morbid illnesses, this patient is at least at moderate risk for complications without adequate follow up.  This format is felt to be most appropriate for this patient at this time.  All issues noted in this document were discussed and addressed.  A limited physical exam was performed with this format.  Please refer to the patient's chart for his consent to telehealth for [facility].   Date:  10/26/2018   ID:  Terry Parks, DOB May 05, 1956, MRN [id number]  Patient Location: Home Provider Location: Home  PCP:  [name], MD  Cardiologist:  [name], MD  Electrophysiologist:  None   Evaluation Performed:  Follow-Up Visit  Chief Complaint:  CHF, HTN  History of Present Illness:    Terry Parks is a 63 y.o. male with chronic combined systolic and diastolic heart failure, hypertension, paroxysmal atrial fibrillation and typical atrial flutter, severe obesity, OSA, DM type 2, history of nephrolithiasis and ureteral obstruction.  He is having problems with lower extremity edema, dyspnea on exertion and had some wheezing last night when he tried to lay down in bed.  Judging by his recent meals, he is eating a relatively high salt diet.  His weight is up at least 8 pounds, may be as much as 17 pounds compared to March.  He has an appointment with his urologist, Dr. Gloriann Loan next week.  He denies any problems with angina either at rest or with activity.  He has pitting edema almost to his knees bilaterally.  He denies intermittent claudication and has not had palpitations, dizziness or syncope.  His blood pressure is very high.  He reports compliance with his medications.  The patient does not have symptoms  concerning for COVID-19 infection (fever, chills, cough, or new shortness of breath).    Past Medical History:  Diagnosis Date  . Abscessed tooth    top back large cavity no pain or drainage, one on bottom  pt pulled tooth 4-5 months ago, right top large hole in tooth  . AKI (acute kidney injury) ([place])   . Allergic rhinitis   . Anemia   . Anxiety   . Asthma   . Atrial fibrillation ([place])   . BPH (benign prostatic hypertrophy)    Massive BPH noted on cystoscopy 1/23/ 2012 by Dr. Risa Grill.  . Cancer [facility])    prostate cancer 2019  . Cardiomyopathy ([place])   . CHF (congestive heart failure) ([place])   . Cough 03/30/2012  . Depression   . Diabetes mellitus 04/08/2008   type 2  . Foley catheter in place 07-05-17 placed  . Headache(784.0)    hx migraines none recent  . Hyperlipemia   . Hypertension   . Hypertensive cardiopathy 03/01/2006   2-D echocardiogram 02/01/2012 showed moderate LVH, mildly to moderately reduced left ventricular systolic function with an estimated ejection fraction of 40-45%, and diffuse hypokinesis.  A nuclear medicine stress study done 01/31/2012 showed no reversible ischemia, a small mid anterior wall fixed defect/infarct, and ejection fraction 42%.      . Neck pain   . Nephrolithiasis 05/29/2010   CT scan of abdomen/pelvis on 05/29/2010 showed an obstructing approximate 1-2 mm calculus at the left UVJ, and an approximate 1-2 mm left lower pole renal calculus.  Patient had continuing severe pain , and an elevation of his serum creatinine to a value of 1.75 on 06/06/2010.  Patient underwent cystoscopy on 06/08/2010 by Dr. Risa Grill, but attempts at retrograde pyelogram and ureteroscopy were unsucc  . Numbness 01/08/2018  . Obstructive sleep apnea 03/06/2008   Sleep study 03/06/08 showed severe OSA/hypopnea syndrome, with successful CPAP titration to 13 CWP using a medium ResMed Mirage Quattro full face mask with heated humidifier.   . Rash 04/17/2014  . Renal calculus 05/29/2010    CT scan of abdomen/pelvis on 05/29/2010 showed an obstructing approximate 1-2 mm calculus at the left UVJ, and an approximate 1-2 mm left lower pole renal calculus.   Patient had continuing severe pain , and an elevation of his serum creatinine to a value of 1.75 on 06/06/2010.  The stone had apparently passed and was not seen on repeat CT 06/08/2010.  . Sleep apnea    haven't use cpap in 2 years  . Tooth pain 10/29/2017  . Urinary straining 11/02/2016   Past Surgical History:  Procedure Laterality Date  . big toe nails removed Bilateral 20 yrs ago  . CARDIOVERSION N/A 06/08/2017   Procedure: CARDIOVERSION;  Surgeon: Josue Hector, MD;  Location[facility] ENDOSCOPY;  Service: Cardiovascular;  Laterality: N/A;  . CYSTOSCOPY W/ RETROGRADES    . IR RADIOLOGIST EVAL & MGMT  02/02/2018  . LYMPHADENECTOMY Bilateral 10/06/2017   Procedure: LYMPHADENECTOMY;  Surgeon: Lucas Mallow, MD;  Location: [facility] ORS;  Service: Urology;  Laterality: Bilateral;  . ROBOT ASSISTED LAPAROSCOPIC RADICAL PROSTATECTOMY N/A 10/06/2017   Procedure: XI ROBOTIC ASSISTED LAPAROSCOPIC RADICAL PROSTATECTOMY;  Surgeon: Lucas Mallow, MD;  Location: [facility] ORS;  Service: Urology;  Laterality: N/A;  . TRANSURETHRAL RESECTION OF BLADDER TUMOR N/A 07/13/2017   Procedure: TRANSURETHRAL RESECTION OF PROSTATE;  Surgeon: Lucas Mallow, MD;  Location: [facility] ORS;  Service: Urology;  Laterality: N/A;     Current Meds  Medication Sig  . Accu-Chek Softclix Lancets lancets Check blood sugar three times a day as instructed  . acetaminophen (TYLENOL) 325 MG tablet Take 325-650 mg by mouth every 6 (six) hours as needed (for pain).  [name] amiodarone (PACERONE) 200 MG tablet Take 1 tablet (200 mg total) by mouth daily.  [name] amLODipine-atorvastatin (CADUET) 5-40 MG tablet Take 1 tablet by mouth daily.  [name] aspirin EC 81 MG tablet Take 1 tablet (81 mg total) by mouth daily.  . Blood Glucose Monitoring Suppl (ACCU-CHEK AVIVA) device Use as instructed  .  carvedilol (COREG) 12.5 MG tablet Take 1 tablet (12.5 mg total) by mouth 2 (two) times daily with a meal for 30 days.  . cholecalciferol (VITAMIN D3) 25 MCG (1000 UT) tablet Take 1 tablet (1,000 Units total) by mouth daily.  . DULoxetine (CYMBALTA) 60 MG capsule Take 1 capsule (60 mg total) by mouth daily.  . ferrous sulfate 325 (65 FE) MG EC tablet Take 1 tablet (325 mg total) by mouth daily. (Patient taking differently: Take 325 mg by mouth daily. )  . gabapentin (NEURONTIN) 300 MG capsule Take 1 capsule (300 mg total) by mouth 2 (two) times daily.  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 hydrocortisone cream 0.5 % Apply 1 application topically 2 (two) times daily.  . Insulin Degludec-Liraglutide (XULTOPHY) 100-3.6 UNIT-MG/ML SOPN Inject 50 Units into the skin daily.  . Insulin Pen Needle (CARETOUCH PEN NEEDLES) 31G X 6  MM MISC 1 pen by Does not apply route at bedtime.  . isosorbide mononitrate (IMDUR) 30 MG 24 hr tablet Take 1 tablet (30 mg total) by mouth daily.  [name] losartan (COZAAR) 50 MG tablet Take 1 tablet (50 mg total) by mouth daily.  . metformin (FORTAMET) 1000 MG (OSM) 24 hr tablet Take 2 tablets (2,000 mg total) by mouth daily with breakfast.  . Misc. Devices MISC by Does not apply route. C-PAP  . nystatin cream (MYCOSTATIN) Apply 1 application topically See admin instructions. Apply to arms and legs 2 times a day  . omeprazole (PRILOSEC) 40 MG capsule Take 1 capsule (40 mg total) by mouth daily.  . rivaroxaban (XARELTO) 20 MG TABS tablet Take 1 tablet (20 mg total) by mouth daily with breakfast.  . torsemide (DEMADEX) 20 MG tablet Take 2 tablets (40 mg total) by mouth 2 (two) times daily.     Allergies:   Lisinopril   Social History   Tobacco Use  . Smoking status: Never Smoker  . Smokeless tobacco: Never Used  Substance Use Topics  . Alcohol use: No    Alcohol/week: 0.0 standard drinks  .  Drug use: No     Family Hx: The patient's family history includes Breast cancer in his mother; Diabetes in his maternal grandmother; Hypertension in his father. There is no history of Colon cancer, Prostate cancer, Heart attack, Esophageal cancer, Inflammatory bowel disease, Liver disease, Pancreatic cancer, Rectal cancer, or Stomach cancer.  ROS:   Please see the history of present illness.     All other systems reviewed and are negative.   Prior CV studies:   The following studies were reviewed today:  January 10, 2018 echocardiogram shows LVEF 35-40%.  The left ventricle is not dilated but does show evidence of LVH and pseudo-normal filling pattern.  The right ventricle is also mildly dilated with mildly reduced systolic function and the right atrium was moderately dilated, while the inferior vena cava is dilated but collapses with respiration.  Labs/Other Tests and Data Reviewed:    EKG:  An ECG dated 01/09/2018 was personally reviewed today and demonstrated:  Sinus tachycardia, possible right atrial abnormality, prominent R waves in V1-V2 consider right ventricular hypertrophy, no ischemic repolarization abnormalities  Recent Labs: 01/11/2018: TSH 1.468 02/08/2018: B Natriuretic Peptide 761.4; Magnesium 1.7 02/13/2018: ALT 31 04/24/2018: Hemoglobin 13.0; Platelets 181.0 07/07/2018: BUN 9; Creatinine, Ser 1.19; Potassium 4.2; Sodium 132   Recent Lipid Panel Lab Results  Component Value Date/Time   CHOL 101 01/09/2018 05:09 AM   CHOL 130 08/22/2017 11:51 AM   TRIG 57 01/09/2018 05:09 AM   HDL 26 (L) 01/09/2018 05:09 AM   HDL 39 (L) 08/22/2017 11:51 AM   CHOLHDL 3.9 01/09/2018 05:09 AM   LDLCALC 64 01/09/2018 05:09 AM   LDLCALC 75 08/22/2017 11:51 AM    Wt Readings from Last 3 Encounters:  10/26/18 299 lb (135.6 kg)  10/13/18 (!) 308 lb 3.2 oz (139.8 kg)  07/27/18 291 lb (132 kg)     Objective:    Vital Signs:  BP (!) 197/115   Pulse (!) 104   Ht 6\' 1"  (1.854 m)   Wt  299 lb (135.6 kg)   BMI 39.45 kg/m    VITAL SIGNS:  reviewed GEN:  He is not in acute distress but is clearly tachypneic, while standing up and talking on his smart phone. EYES:  sclerae anicteric, EOMI - Extraocular Movements Intact RESPIRATORY:  Breathing rapidly, but did not appear to  be labored. CARDIOVASCULAR:  lower extremity edema noted SKIN:  no rash, lesions or ulcers. MUSCULOSKELETAL:  no obvious deformities. NEURO:  alert and oriented x 3, no obvious focal deficit PSYCH:  normal affect Severely obese  ASSESSMENT & PLAN:    1. CHF: Appears to have acute on chronic systolic and diastolic heart failure.  He was on Jardiance for his diabetes and is no longer taking this.  I wonder if stopping this medication led to his decompensation.  Would like to get him back on it.  May also be due to sodium dietary indiscretion.  We will double his loop diuretic and restart him on spironolactone, which has inadvertently been stopped. Try to get him back to an estimated "dry weight" of 285 lb.  Reevaluate labs next week (hopefully can have them drawn when he goes to see his urologist).  Needs to keep a detailed log of his weights and avoid sodium rich foods.  We discussed his diet at length today.  Follow-up clinical appointment in a couple of weeks. 2. CMP: The cause of his cardiomyopathy was never entirely clear, but his ejection fraction has always improved when we have achieved good blood pressure control.  To a large degree, his left ventricular dysfunction seems to be related to high blood pressure. 3. HTN: Reinforced the need for compliance with all of his medications.  Restart spironolactone.  He is in some discomfort, BP may be better when he is more relaxed.[name]  Reevaluate needs to increase blood pressure medicines at his next appointment.  Would like to recheck his renal function parameters before deciding how to adjust his medications further. 4. AFib: Unable to tell what rhythm he is in with  this video visit, but he is at most mildly tachycardic.  Continue carvedilol.  CHADVasc 3 (DM, HTN, HF). 5. Anticoagulation: He is taking Xarelto without bleeding complications.  Stop aspirin. 6. OSA: His echo showed evidence of chronic cor pulmonale with dilated right atrium and right ventricle and his ECG supports this as well.  Recommended 100% compliance with CPAP. 7. DM: Need to see why he is no longer on Jardiance.  He was doing very well on this medication. 8. HLP:  Target LDL cholesterol less than 100, preferably less than 70.  COVID-19 Education: The signs and symptoms of COVID-19 were discussed with the patient and how to seek care for testing (follow up with PCP or arrange E-visit).  The importance of social distancing was discussed today.  Time:   Today, I have spent 21 minutes with the patient with telehealth technology discussing the above problems.     Medication Adjustments/Labs and Tests Ordered: Current medicines are reviewed at length with the patient today.  Concerns regarding medicines are outlined above.   Tests Ordered: No orders of the defined types were placed in this encounter.   Medication Changes: No orders of the defined types were placed in this encounter.   Patient Instructions  Medication Instructions:  TAKE the Torsemide 40 mg (2 of the 20 mg tablets) twice daily RESTART the Spironolactone 25 mg once daily STOP the Aspirin since you are on Xarelto  If you need a refill on your cardiac medications before your next appointment, please call your pharmacy.   Lab work: Your provider would like for you to return next week to have the following labs drawn: BMET and Magnesium. You do not need an appointment for the lab. Once in our office lobby there is a podium where you can sign  in and ring the doorbell to alert [place] that you are here. The lab is open from 8:00 am to 4:30 pm; closed for lunch from 12:45pm-1:45pm.  * You may get these done at your urologist  if that is easier for you.  Testing/Procedures: None ordered  Follow-Up: . Follow up with an APP in 2 weeks . Follow up in 3 months with Dr. Sallyanne Kuster  Any Other Special Instructions Will Be Listed Below (If Applicable).  Please weigh yourself every morning around the same time on the same scale before you have had breakfast. Keep a log of these weights. We will look into getting a scale for you and will call you when one is available.   Low-Sodium Eating Plan Sodium, which is an element that makes up salt, helps you maintain a healthy balance of fluids in your body. Too much sodium can increase your blood pressure and cause fluid and waste to be held in your body. Your health care provider or dietitian may recommend following this plan if you have high blood pressure (hypertension), kidney disease, liver disease, or heart failure. Eating less sodium can help lower your blood pressure, reduce swelling, and protect your heart, liver, and kidneys. What are tips for following this plan? General guidelines  Most people on this plan should limit their sodium intake to 1,500-2,000 mg (milligrams) of sodium each day. Reading food labels   The Nutrition Facts label lists the amount of sodium in one serving of the food. If you eat more than one serving, you must multiply the listed amount of sodium by the number of servings.  Choose foods with less than 140 mg of sodium per serving.  Avoid foods with 300 mg of sodium or more per serving. Shopping  Look for lower-sodium products, often labeled as "low-sodium" or "no salt added."  Always check the sodium content even if foods are labeled as "unsalted" or "no salt added".  Buy fresh foods. ? Avoid canned foods and premade or frozen meals. ? Avoid canned, cured, or processed meats  Buy breads that have less than 80 mg of sodium per slice. Cooking  Eat more home-cooked food and less restaurant, buffet, and fast food.  Avoid adding salt  when cooking. Use salt-free seasonings or herbs instead of table salt or sea salt. Check with your health care provider or pharmacist before using salt substitutes.  Cook with plant-based oils, such as canola, sunflower, or Parks oil. Meal planning  When eating at a restaurant, ask that your food be prepared with less salt or no salt, if possible.  Avoid foods that contain MSG (monosodium glutamate). MSG is sometimes added to [place] food, bouillon, and some canned foods. What foods are recommended? The items listed may not be a complete list. Talk with your dietitian about what dietary choices are best for you. Grains Low-sodium cereals, including oats, puffed wheat and rice, and shredded wheat. Low-sodium crackers. Unsalted rice. Unsalted pasta. Low-sodium bread. Whole-grain breads and whole-grain pasta. Vegetables Fresh or frozen vegetables. "No salt added" canned vegetables. "No salt added" tomato sauce and paste. Low-sodium or reduced-sodium tomato and vegetable juice. Fruits Fresh, frozen, or canned fruit. Fruit juice. Meats and other protein foods Fresh or frozen (no salt added) meat, poultry, seafood, and fish. Low-sodium canned tuna and salmon. Unsalted nuts. Dried peas, beans, and lentils without added salt. Unsalted canned beans. Eggs. Unsalted nut butters. Dairy Milk. Soy milk. Cheese that is naturally low in sodium, such as ricotta cheese, fresh mozzarella, or  Swiss cheese Low-sodium or reduced-sodium cheese. Cream cheese. Yogurt. Fats and oils Unsalted butter. Unsalted margarine with no trans fat. Vegetable oils such as canola or Parks oils. Seasonings and other foods Fresh and dried herbs and spices. Salt-free seasonings. Low-sodium mustard and ketchup. Sodium-free salad dressing. Sodium-free light mayonnaise. Fresh or refrigerated horseradish. Lemon juice. Vinegar. Homemade, reduced-sodium, or low-sodium soups. Unsalted popcorn and pretzels. Low-salt or salt-free chips. What  foods are not recommended? The items listed may not be a complete list. Talk with your dietitian about what dietary choices are best for you. Grains Instant hot cereals. Bread stuffing, pancake, and biscuit mixes. Croutons. Seasoned rice or pasta mixes. Noodle soup cups. Boxed or frozen macaroni and cheese. Regular salted crackers. Self-rising flour. Vegetables Sauerkraut, pickled vegetables, and relishes. Olives. [place] fries. Onion rings. Regular canned vegetables (not low-sodium or reduced-sodium). Regular canned tomato sauce and paste (not low-sodium or reduced-sodium). Regular tomato and vegetable juice (not low-sodium or reduced-sodium). Frozen vegetables in sauces. Meats and other protein foods Meat or fish that is salted, canned, smoked, spiced, or pickled. Bacon, ham, sausage, hotdogs, corned beef, chipped beef, packaged lunch meats, salt pork, jerky, pickled herring, anchovies, regular canned tuna, sardines, salted nuts. Dairy Processed cheese and cheese spreads. Cheese curds. Blue cheese. Feta cheese. String cheese. Regular cottage cheese. Buttermilk. Canned milk. Fats and oils Salted butter. Regular margarine. Ghee. Bacon fat. Seasonings and other foods Onion salt, garlic salt, seasoned salt, table salt, and sea salt. Canned and packaged gravies. Worcestershire sauce. Tartar sauce. Barbecue sauce. Teriyaki sauce. Soy sauce, including reduced-sodium. Steak sauce. Fish sauce. Oyster sauce. Cocktail sauce. Horseradish that you find on the shelf. Regular ketchup and mustard. Meat flavorings and tenderizers. Bouillon cubes. Hot sauce and Tabasco sauce. Premade or packaged marinades. Premade or packaged taco seasonings. Relishes. Regular salad dressings. Salsa. Potato and tortilla chips. Corn chips and puffs. Salted popcorn and pretzels. Canned or dried soups. Pizza. Frozen entrees and pot pies. Summary  Eating less sodium can help lower your blood pressure, reduce swelling, and protect your  heart, liver, and kidneys.  Most people on this plan should limit their sodium intake to 1,500-2,000 mg (milligrams) of sodium each day.  Canned, boxed, and frozen foods are high in sodium. Restaurant foods, fast foods, and pizza are also very high in sodium. You also get sodium by adding salt to food.  Try to cook at home, eat more fresh fruits and vegetables, and eat less fast food, canned, processed, or prepared foods. This information is not intended to replace advice given to you by your health care provider. Make sure you discuss any questions you have with your health care provider. Document Released: 10/23/2001 Document Revised: 04/26/2016 Document Reviewed: 04/26/2016 Elsevier Interactive Patient Education  2019 [employer]        Disposition:  Follow up 2 weeks  Signed, [name], MD  10/26/2018 9:40 AM    [place]

## 2018-10-26 NOTE — Patient Instructions (Addendum)
Medication Instructions:  TAKE the Torsemide 40 mg (2 of the 20 mg tablets) twice daily RESTART the Spironolactone 25 mg once daily STOP the Aspirin since you are on Xarelto  If you need a refill on your cardiac medications before your next appointment, please call your pharmacy.   Lab work: Your provider would like for you to return next week to have the following labs drawn: BMET and Magnesium. You do not need an appointment for the lab. Once in our office lobby there is a podium where you can sign in and ring the doorbell to alert [place] that you are here. The lab is open from 8:00 am to 4:30 pm; closed for lunch from 12:45pm-1:45pm.  * You may get these done at your urologist if that is easier for you.  Testing/Procedures: None ordered  Follow-Up: . Follow up with an APP in 2 weeks . Follow up in 3 months with Dr. Sallyanne Kuster  Any Other Special Instructions Will Be Listed Below (If Applicable).  Please weigh yourself every morning around the same time on the same scale before you have had breakfast. Keep a log of these weights. We will look into getting a scale for you and will call you when one is available.   Low-Sodium Eating Plan Sodium, which is an element that makes up salt, helps you maintain a healthy balance of fluids in your body. Too much sodium can increase your blood pressure and cause fluid and waste to be held in your body. Your health care provider or dietitian may recommend following this plan if you have high blood pressure (hypertension), kidney disease, liver disease, or heart failure. Eating less sodium can help lower your blood pressure, reduce swelling, and protect your heart, liver, and kidneys. What are tips for following this plan? General guidelines  Most people on this plan should limit their sodium intake to 1,500-2,000 mg (milligrams) of sodium each day. Reading food labels   The Nutrition Facts label lists the amount of sodium in one serving of the food.  If you eat more than one serving, you must multiply the listed amount of sodium by the number of servings.  Choose foods with less than 140 mg of sodium per serving.  Avoid foods with 300 mg of sodium or more per serving. Shopping  Look for lower-sodium products, often labeled as "low-sodium" or "no salt added."  Always check the sodium content even if foods are labeled as "unsalted" or "no salt added".  Buy fresh foods. ? Avoid canned foods and premade or frozen meals. ? Avoid canned, cured, or processed meats  Buy breads that have less than 80 mg of sodium per slice. Cooking  Eat more home-cooked food and less restaurant, buffet, and fast food.  Avoid adding salt when cooking. Use salt-free seasonings or herbs instead of table salt or sea salt. Check with your health care provider or pharmacist before using salt substitutes.  Cook with plant-based oils, such as canola, sunflower, or olive oil. Meal planning  When eating at a restaurant, ask that your food be prepared with less salt or no salt, if possible.  Avoid foods that contain MSG (monosodium glutamate). MSG is sometimes added to [place] food, bouillon, and some canned foods. What foods are recommended? The items listed may not be a complete list. Talk with your dietitian about what dietary choices are best for you. Grains Low-sodium cereals, including oats, puffed wheat and rice, and shredded wheat. Low-sodium crackers. Unsalted rice. Unsalted pasta. Low-sodium bread.  Whole-grain breads and whole-grain pasta. Vegetables Fresh or frozen vegetables. "No salt added" canned vegetables. "No salt added" tomato sauce and paste. Low-sodium or reduced-sodium tomato and vegetable juice. Fruits Fresh, frozen, or canned fruit. Fruit juice. Meats and other protein foods Fresh or frozen (no salt added) meat, poultry, seafood, and fish. Low-sodium canned tuna and salmon. Unsalted nuts. Dried peas, beans, and lentils without added salt.  Unsalted canned beans. Eggs. Unsalted nut butters. Dairy Milk. Soy milk. Cheese that is naturally low in sodium, such as ricotta cheese, fresh mozzarella, or Swiss cheese Low-sodium or reduced-sodium cheese. Cream cheese. Yogurt. Fats and oils Unsalted butter. Unsalted margarine with no trans fat. Vegetable oils such as canola or olive oils. Seasonings and other foods Fresh and dried herbs and spices. Salt-free seasonings. Low-sodium mustard and ketchup. Sodium-free salad dressing. Sodium-free light mayonnaise. Fresh or refrigerated horseradish. Lemon juice. Vinegar. Homemade, reduced-sodium, or low-sodium soups. Unsalted popcorn and pretzels. Low-salt or salt-free chips. What foods are not recommended? The items listed may not be a complete list. Talk with your dietitian about what dietary choices are best for you. Grains Instant hot cereals. Bread stuffing, pancake, and biscuit mixes. Croutons. Seasoned rice or pasta mixes. Noodle soup cups. Boxed or frozen macaroni and cheese. Regular salted crackers. Self-rising flour. Vegetables Sauerkraut, pickled vegetables, and relishes. Olives. [place] fries. Onion rings. Regular canned vegetables (not low-sodium or reduced-sodium). Regular canned tomato sauce and paste (not low-sodium or reduced-sodium). Regular tomato and vegetable juice (not low-sodium or reduced-sodium). Frozen vegetables in sauces. Meats and other protein foods Meat or fish that is salted, canned, smoked, spiced, or pickled. Bacon, ham, sausage, hotdogs, corned beef, chipped beef, packaged lunch meats, salt pork, jerky, pickled herring, anchovies, regular canned tuna, sardines, salted nuts. Dairy Processed cheese and cheese spreads. Cheese curds. Blue cheese. Feta cheese. String cheese. Regular cottage cheese. Buttermilk. Canned milk. Fats and oils Salted butter. Regular margarine. Ghee. Bacon fat. Seasonings and other foods Onion salt, garlic salt, seasoned salt, table salt, and sea  salt. Canned and packaged gravies. Worcestershire sauce. Tartar sauce. Barbecue sauce. Teriyaki sauce. Soy sauce, including reduced-sodium. Steak sauce. Fish sauce. Oyster sauce. Cocktail sauce. Horseradish that you find on the shelf. Regular ketchup and mustard. Meat flavorings and tenderizers. Bouillon cubes. Hot sauce and Tabasco sauce. Premade or packaged marinades. Premade or packaged taco seasonings. Relishes. Regular salad dressings. Salsa. Potato and tortilla chips. Corn chips and puffs. Salted popcorn and pretzels. Canned or dried soups. Pizza. Frozen entrees and pot pies. Summary  Eating less sodium can help lower your blood pressure, reduce swelling, and protect your heart, liver, and kidneys.  Most people on this plan should limit their sodium intake to 1,500-2,000 mg (milligrams) of sodium each day.  Canned, boxed, and frozen foods are high in sodium. Restaurant foods, fast foods, and pizza are also very high in sodium. You also get sodium by adding salt to food.  Try to cook at home, eat more fresh fruits and vegetables, and eat less fast food, canned, processed, or prepared foods. This information is not intended to replace advice given to you by your health care provider. Make sure you discuss any questions you have with your health care provider. Document Released: 10/23/2001 Document Revised: 04/26/2016 Document Reviewed: 04/26/2016 Elsevier Interactive Patient Education  2019 [employer].

## 2018-10-26 NOTE — Telephone Encounter (Signed)
Call placed to the patient to go over the virtual visit instructions from today.  He will: Take the torsemide 40 mg twice daily instead on daily Restart the Spironolactone 25 mg once daily Stop the aspirin Weigh himself daily. He will call the office when he will come pick up the scale that is available to him. Get his lab work at the urologist.  An AVS will be mailed to him. He has been advised to call if he has any questions.

## 2018-10-26 NOTE — Telephone Encounter (Signed)
Per provider pt needs home scale. Home scale left at front desk for patient to pickup. Pt will need to sign he has pickup.

## 2018-10-31 NOTE — Addendum Note (Signed)
Addended by: Forde Dandy on: 10/31/2018 05:56 PM   Modules accepted: Orders

## 2018-11-02 DIAGNOSIS — R32 Unspecified urinary incontinence: Secondary | ICD-10-CM | POA: Diagnosis not present

## 2018-11-03 ENCOUNTER — Ambulatory Visit (INDEPENDENT_AMBULATORY_CARE_PROVIDER_SITE_OTHER): Payer: Medicaid Other | Admitting: Gastroenterology

## 2018-11-03 ENCOUNTER — Other Ambulatory Visit: Payer: Self-pay

## 2018-11-03 VITALS — Ht 73.0 in | Wt 299.0 lb

## 2018-11-03 DIAGNOSIS — D509 Iron deficiency anemia, unspecified: Secondary | ICD-10-CM

## 2018-11-03 DIAGNOSIS — K2100 Gastro-esophageal reflux disease with esophagitis, without bleeding: Secondary | ICD-10-CM

## 2018-11-03 DIAGNOSIS — Z1211 Encounter for screening for malignant neoplasm of colon: Secondary | ICD-10-CM

## 2018-11-03 NOTE — Patient Instructions (Signed)
Your provider has requested that you go to the basement level for lab work before leaving today. Press "B" on the elevator. The lab is located at the first door on the left as you exit the elevator.  Thank you for choosing me and [facility].  Dr. Rush Landmark

## 2018-11-03 NOTE — Progress Notes (Signed)
[place] VISIT   Primary Care Provider [name], MD [place] [address] [phone]  Patient Profile: Terry Parks is a 63 y.o. male with a pmh significant for Afib (on Xarelto), HTN, HLD, OSA, BPH, MDD, Nephrolithiasis, DM, HFrEF, GERD, esophagitis, IDA.  The patient presents to the [facility] for an evaluation and management of problem(s) noted below:  Problem List 1. Iron deficiency anemia, unspecified iron deficiency anemia type   2. Gastroesophageal reflux disease with esophagitis   3. Colon cancer screening     I connected with  [name] on 11/03/18. I verified that I was speaking with the correct person using two identifiers. Due to the COVID-19 Pandemic, this service was provided via telemedicine using Audio-only. Interactive audio and video telecommunications were attempted between this provider and patient, however failed, due to patient  not having access to video capability and thus to provide timely and excellent care, we continued and completed visit with audio only. The patient was located at home. The provider was located in the office. The patient did consent to this visit and is aware of charges through their insurance as well as the limitations of evaluation and management by telemedicine. Other persons participating in this telemedicine service were none. Time spent on visit was 25 in discussion and coordination of care.   History of Present Illness: Please see initial consultation note for full details of HPI.  Interval History Patient follows up today for further evaluation.  Overall after review of the chart seems like he has had some increasing issues with dyspnea on exertion as well as shortness of breath.  He has had increased anasarca.  His diuretics have been adjusted per recent cardiology notation.  He was taking PPI once twice daily and then it was transitioned to once  daily by primary care.  His indigestion and heartburn are much improved.  He denies any dysphagia.  He states his weight is improving.  He continues to take iron twice daily.  He has not had any iron indices or repeat blood count checked recently.  He wants to move forward with trying to get his procedures scheduled as necessary.    GI Review of Systems Positive as above Negative for odynophagia, abdominal pain, early satiety, bloating, nausea, vomiting, change in bowel habits, melena, hematochezia  Review of Systems General: Denies fevers/chills/weight loss Cardiovascular: Denies chest pain Pulmonary: Shortness of breath improving with diuretics Gastroenterological: See HPI Genitourinary: Denies darkened urine or hematuria Hematological: Continues to have longstanding bruising/bleeding due to anticoagulation Dermatological: Denies jaundice Psychological: Mood is stable   Medications Current Outpatient Medications  Medication Sig Dispense Refill   Accu-Chek Softclix Lancets lancets Check blood sugar three times a day as instructed 300 each 3   acetaminophen (TYLENOL) 325 MG tablet Take 325-650 mg by mouth every 6 (six) hours as needed (for pain).     amiodarone (PACERONE) 200 MG tablet Take 1 tablet (200 mg total) by mouth daily. 90 tablet 1   amLODipine (NORVASC) 5 MG tablet Take 1 tablet (5 mg total) by mouth daily. (Patient not taking: Reported on 10/26/2018) 30 tablet 2   amLODipine-atorvastatin (CADUET) 5-40 MG tablet Take 1 tablet by mouth daily.     atorvastatin (LIPITOR) 40 MG tablet Take 1 tablet (40 mg total) by mouth daily. 90 tablet 0   Blood Glucose Monitoring Suppl (ACCU-CHEK AVIVA) device Use as instructed 1 each 0   carvedilol (COREG) 12.5 MG tablet Take 1 tablet (  12.5 mg total) by mouth 2 (two) times daily with a meal for 30 days. 180 tablet 3   cholecalciferol (VITAMIN D3) 25 MCG (1000 UT) tablet Take 1 tablet (1,000 Units total) by mouth daily. 30 tablet 11    DULoxetine (CYMBALTA) 60 MG capsule Take 1 capsule (60 mg total) by mouth daily. 90 capsule 3   ferrous sulfate 325 (65 FE) MG EC tablet Take 1 tablet (325 mg total) by mouth daily. (Patient taking differently: Take 325 mg by mouth daily. ) 90 tablet 0   gabapentin (NEURONTIN) 300 MG capsule Take 1 capsule (300 mg total) by mouth 2 (two) times daily. 60 capsule 3   glucose blood (ACCU-CHEK AVIVA) test strip Use to check blood sugar 3 times daily diag code E11.42. insulin dependent 300 each 11   hydrocortisone cream 0.5 % Apply 1 application topically 2 (two) times daily. 30 g 0   Insulin Degludec-Liraglutide (XULTOPHY) 100-3.6 UNIT-MG/ML SOPN Inject 50 Units into the skin daily. 5 pen 11   Insulin Pen Needle (CARETOUCH PEN NEEDLES) 31G X 6 MM MISC 1 pen by Does not apply route at bedtime. 100 each 3   isosorbide mononitrate (IMDUR) 30 MG 24 hr tablet Take 1 tablet (30 mg total) by mouth daily. 90 tablet 3   losartan (COZAAR) 50 MG tablet Take 1 tablet (50 mg total) by mouth daily. 90 tablet 3   metformin (FORTAMET) 1000 MG (OSM) 24 hr tablet Take 2 tablets (2,000 mg total) by mouth daily with breakfast. 180 tablet 1   Misc. Devices MISC by Does not apply route. C-PAP     nystatin cream (MYCOSTATIN) Apply 1 application topically See admin instructions. Apply to arms and legs 2 times a day 30 g 1   omeprazole (PRILOSEC) 40 MG capsule Take 1 capsule (40 mg total) by mouth daily. 30 capsule 0   polyethylene glycol (MIRALAX / GLYCOLAX) packet Take 17 g by mouth daily. (Patient not taking: Reported on 10/26/2018) 14 each 0   rivaroxaban (XARELTO) 20 MG TABS tablet Take 1 tablet (20 mg total) by mouth daily with breakfast. 90 tablet 3   spironolactone (ALDACTONE) 25 MG tablet Take 1 tablet (25 mg total) by mouth daily. 30 tablet 2   torsemide (DEMADEX) 20 MG tablet Take 2 tablets (40 mg total) by mouth 2 (two) times daily. 120 tablet 2   No current facility-administered medications for this  visit.     Allergies Allergies  Allergen Reactions   Lisinopril Cough    Histories Past Medical History:  Diagnosis Date   Abscessed tooth    top back large cavity no pain or drainage, one on bottom  pt pulled tooth 4-5 months ago, right top large hole in tooth   AKI (acute kidney injury) ([place])    Allergic rhinitis    Anemia    Anxiety    Asthma    Atrial fibrillation (HCC)    BPH (benign prostatic hypertrophy)    Massive BPH noted on cystoscopy 1/23/ 2012 by Dr. Risa Grill.   Cancer [facility])    prostate cancer 2019   Cardiomyopathy [facility])    CHF (congestive heart failure) ([place])    Cough 03/30/2012   Depression    Diabetes mellitus 04/08/2008   type 2   Foley catheter in place 07-05-17 placed   Headache(784.0)    hx migraines none recent   Hyperlipemia    Hypertension    Hypertensive cardiopathy 03/01/2006   2-D echocardiogram 02/01/2012 showed moderate LVH, mildly to  moderately reduced left ventricular systolic function with an estimated ejection fraction of 40-45%, and diffuse hypokinesis.  A nuclear medicine stress study done 01/31/2012 showed no reversible ischemia, a small mid anterior wall fixed defect/infarct, and ejection fraction 42%.       Neck pain    Nephrolithiasis 05/29/2010   CT scan of abdomen/pelvis on 05/29/2010 showed an obstructing approximate 1-2 mm calculus at the left UVJ, and an approximate 1-2 mm left lower pole renal calculus.   Patient had continuing severe pain , and an elevation of his serum creatinine to a value of 1.75 on 06/06/2010.  Patient underwent cystoscopy on 06/08/2010 by Dr. Risa Grill, but attempts at retrograde pyelogram and ureteroscopy were unsucc   Numbness 01/08/2018   Obstructive sleep apnea 03/06/2008   Sleep study 03/06/08 showed severe OSA/hypopnea syndrome, with successful CPAP titration to 13 CWP using a medium ResMed Mirage Quattro full face mask with heated humidifier.    Rash 04/17/2014   Renal calculus 05/29/2010     CT scan of abdomen/pelvis on 05/29/2010 showed an obstructing approximate 1-2 mm calculus at the left UVJ, and an approximate 1-2 mm left lower pole renal calculus.   Patient had continuing severe pain , and an elevation of his serum creatinine to a value of 1.75 on 06/06/2010.  The stone had apparently passed and was not seen on repeat CT 06/08/2010.   Sleep apnea    haven't use cpap in 2 years   Tooth pain 10/29/2017   Urinary straining 11/02/2016   Past Surgical History:  Procedure Laterality Date   big toe nails removed Bilateral 20 yrs ago   CARDIOVERSION N/A 06/08/2017   Procedure: CARDIOVERSION;  Surgeon: Josue Hector, MD;  Location: [place];  Service: Cardiovascular;  Laterality: N/A;   CYSTOSCOPY W/ RETROGRADES     IR RADIOLOGIST EVAL & MGMT  02/02/2018   LYMPHADENECTOMY Bilateral 10/06/2017   Procedure: LYMPHADENECTOMY;  Surgeon: Lucas Mallow, MD;  Location: [facility];  Service: Urology;  Laterality: Bilateral;   ROBOT ASSISTED LAPAROSCOPIC RADICAL PROSTATECTOMY N/A 10/06/2017   Procedure: XI ROBOTIC ASSISTED LAPAROSCOPIC RADICAL PROSTATECTOMY;  Surgeon: Lucas Mallow, MD;  Location: [facility];  Service: Urology;  Laterality: N/A;   TRANSURETHRAL RESECTION OF BLADDER TUMOR N/A 07/13/2017   Procedure: TRANSURETHRAL RESECTION OF PROSTATE;  Surgeon: Lucas Mallow, MD;  Location: [facility];  Service: Urology;  Laterality: N/A;   Social History   Socioeconomic History   Marital status: Single    Spouse name: Not on file   Number of children: 4 b   Years of education: 16   Highest education level: Not on file  Occupational History   Occupation:        Fish farm manager: UNEMPLOYED  Social Designer, fashion/clothing strain: Not on file   Food insecurity    Worry: Not on file    Inability: Not on file   Transportation needs    Medical: Not on file    Non-medical: Not on file  Tobacco Use   Smoking status: Never Smoker   Smokeless tobacco: Never Used   Substance and Sexual Activity   Alcohol use: No    Alcohol/week: 0.0 standard drinks   Drug use: No   Sexual activity: Not Currently  Lifestyle   Physical activity    Days per week: Not on file    Minutes per session: Not on file   Stress: Not on file  Relationships   Social connections  Talks on phone: Not on file    Gets together: Not on file    Attends religious service: Not on file    Active member of club or organization: Not on file    Attends meetings of clubs or organizations: Not on file    Relationship status: Not on file   Intimate partner violence    Fear of current or ex partner: Not on file    Emotionally abused: Not on file    Physically abused: Not on file    Forced sexual activity: Not on file  Other Topics Concern   Not on file  Social History Narrative   Divorced, 4 children, lives alone.     Family History  Problem Relation Age of Onset   Breast cancer Mother    Hypertension Father    Diabetes Maternal Grandmother    Colon cancer Neg Hx    Prostate cancer Neg Hx    Heart attack Neg Hx    Esophageal cancer Neg Hx    Inflammatory bowel disease Neg Hx    Liver disease Neg Hx    Pancreatic cancer Neg Hx    Rectal cancer Neg Hx    Stomach cancer Neg Hx    I have reviewed his medical, social, and family history in detail and updated the electronic medical record as necessary.    PHYSICAL EXAMINATION  Telehealth visit   REVIEW OF DATA  I reviewed the following data at the time of this encounter:  GI Procedures and Studies  January 2020 EGD - LA Grade D esophagitis. Biopsied. - Z-line irregular, 41 cm from the incisors. - Mild gastritis. No other gross lesions in the stomach. Biopsied for HP. - No gross lesions in the duodenal bulb, in the first portion of the duodenum and in the second portion of the duodenum. Biopsied.  January 2020 Colonoscopy - Preparation of the colon was inadequate. Copious lavage did not allow  for fair preparation. - Non-thrombosed internal hemorrhoids found on digital rectal exam. - The examined portion of the ileum was normal. - Non-bleeding non-thrombosed internal hemorrhoids.   Laboratory Studies  Reviewed in epic  Imaging Studies  No new imaging studies to review   ASSESSMENT  Mr. Lupinacci is a 63 y.o. male with a pmh significant for Afib (on Xarelto), HTN, HLD, OSA, BPH, MDD, Nephrolithiasis, DM, HFrEF, GERD, esophagitis, IDA.  The patient is seen today for evaluation and management of:  1. Iron deficiency anemia, unspecified iron deficiency anemia type   2. Gastroesophageal reflux disease with esophagitis   3. Colon cancer screening    The patient remains hemodynamically stable clinically from a GI perspective seems to be doing okay.  He does require follow-up endoscopy for follow-up of his severe esophagitis although from a clinical perspective is doing well.  I am okay with him continuing once daily PPI for now.  We did not get to do a full colonoscopy due to poor preparation and that needs to be redone this year.  Planning on trying to get this scheduled with repeat endoscopy is most reasonable.  I really would like to evaluate and see how things are in regards to ruling out any sort of underlying etiologies for his previous iron deficiency in his colon.  We will get the okay for him coming off anticoagulation as he previously did.  States is not having any issues with his previous large hiatal hernia and is okay with waiting on surgical evaluation once again.  We will get  some laboratories to evaluate his blood count as well as his iron indices to see if he may benefit from IV iron.  The risks and benefits of endoscopic evaluation were discussed with the patient; these include but are not limited to the risk of perforation, infection, bleeding, missed lesions, lack of diagnosis, severe illness requiring hospitalization, as well as anesthesia and sedation related illnesses.   The patient is agreeable to proceed.  All patient questions were answered, to the best of my ability, and the patient agrees to the aforementioned plan of action with follow-up as indicated.   PLAN  Laboratories as outlined below Follow-up surveillance endoscopy to evaluate for healing esophagitis Repeat screening colonoscopy due to poor preparation May benefit from IV iron dependent on iron indices and blood counts Continue oral iron twice daily for now Continue PPI once daily for now   Orders Placed This Encounter  Procedures   CBC   IBC + Ferritin    New Prescriptions   No medications on file   Modified Medications   No medications on file    Planned Follow Up: No follow-ups on file.   [name], MD [facility] Advanced Endoscopy Office # [phone]

## 2018-11-04 ENCOUNTER — Encounter: Payer: Self-pay | Admitting: Gastroenterology

## 2018-11-04 NOTE — Progress Notes (Signed)
Please arrange pt follow up with me. Thank you!

## 2018-11-06 ENCOUNTER — Telehealth: Payer: Self-pay | Admitting: Physician Assistant

## 2018-11-08 NOTE — Telephone Encounter (Signed)
I completed it 1-2weeks ago and placed it in the box

## 2018-11-08 NOTE — Progress Notes (Signed)
Please arrange appt for this pt with me Dr Maricela Bo Thanks

## 2018-11-09 NOTE — Progress Notes (Signed)
"  There are no appointments ava with Dr. Maricela Bo. Please advise if an [facility] appt is needed. This patient due to see Christene Slates and lab on 11/10/2018. Does this patient need to be seen by [facility] on Friday as well? " per [place] Thanks

## 2018-11-09 NOTE — Telephone Encounter (Signed)
patient returned call/ smartphone/ consent/ my chart/ pre reg completed °

## 2018-11-10 ENCOUNTER — Telehealth: Payer: Self-pay | Admitting: Dietician

## 2018-11-10 ENCOUNTER — Other Ambulatory Visit: Payer: Self-pay

## 2018-11-10 ENCOUNTER — Other Ambulatory Visit (INDEPENDENT_AMBULATORY_CARE_PROVIDER_SITE_OTHER): Payer: Medicaid Other

## 2018-11-10 ENCOUNTER — Ambulatory Visit: Payer: Medicaid Other | Admitting: Dietician

## 2018-11-10 DIAGNOSIS — E1142 Type 2 diabetes mellitus with diabetic polyneuropathy: Secondary | ICD-10-CM | POA: Diagnosis not present

## 2018-11-10 DIAGNOSIS — D509 Iron deficiency anemia, unspecified: Secondary | ICD-10-CM | POA: Diagnosis not present

## 2018-11-10 DIAGNOSIS — K2100 Gastro-esophageal reflux disease with esophagitis, without bleeding: Secondary | ICD-10-CM

## 2018-11-10 DIAGNOSIS — I5043 Acute on chronic combined systolic (congestive) and diastolic (congestive) heart failure: Secondary | ICD-10-CM | POA: Diagnosis not present

## 2018-11-10 DIAGNOSIS — I10 Essential (primary) hypertension: Secondary | ICD-10-CM

## 2018-11-10 DIAGNOSIS — Z1211 Encounter for screening for malignant neoplasm of colon: Secondary | ICD-10-CM | POA: Diagnosis not present

## 2018-11-10 NOTE — Telephone Encounter (Signed)
Patient was unaware of his appointment. He will try to get here for lab. May need to reschedule diabetes self management follow up appointment.  Debera Lat, RD 11/10/2018 10:19 AM.

## 2018-11-10 NOTE — Progress Notes (Signed)
I can see in September

## 2018-11-10 NOTE — Addendum Note (Signed)
Addended by: Orson Gear on: 11/10/2018 11:42 AM   Modules accepted: Orders

## 2018-11-10 NOTE — Addendum Note (Signed)
Addended by: Orson Gear on: 11/10/2018 11:40 AM   Modules accepted: Orders

## 2018-11-10 NOTE — Addendum Note (Signed)
Addended by: Truddie Crumble on: 11/10/2018 11:30 AM   Modules accepted: Orders

## 2018-11-10 NOTE — Telephone Encounter (Signed)
I called pt to confirm his appt  On 11-10-18 with Almyra Deforest.

## 2018-11-11 LAB — BMP8+ANION GAP
Anion Gap: 18 mmol/L (ref 10.0–18.0)
BUN/Creatinine Ratio: 6 — ABNORMAL LOW (ref 10–24)
BUN: 10 mg/dL (ref 8–27)
CO2: 22 mmol/L (ref 20–29)
Calcium: 9.1 mg/dL (ref 8.6–10.2)
Chloride: 103 mmol/L (ref 96–106)
Creatinine, Ser: 1.6 mg/dL — ABNORMAL HIGH (ref 0.76–1.27)
GFR calc Af Amer: 53 mL/min/{1.73_m2} — ABNORMAL LOW (ref >59)
GFR calc non Af Amer: 46 mL/min/{1.73_m2} — ABNORMAL LOW (ref >59)
Glucose: 97 mg/dL (ref 65–99)
Potassium: 4.2 mmol/L (ref 3.5–5.2)
Sodium: 143 mmol/L (ref 134–144)

## 2018-11-11 LAB — MICROALBUMIN / CREATININE URINE RATIO
Creatinine, Urine: 64.8 mg/dL
Microalb/Creat Ratio: 188 mg/g creat — ABNORMAL HIGH (ref 0–29)
Microalbumin, Urine: 121.9 ug/mL

## 2018-11-11 LAB — BASIC METABOLIC PANEL
BUN/Creatinine Ratio: 6 — ABNORMAL LOW (ref 10–24)
BUN: 10 mg/dL (ref 8–27)
CO2: 23 mmol/L (ref 20–29)
Calcium: 9 mg/dL (ref 8.6–10.2)
Chloride: 103 mmol/L (ref 96–106)
Creatinine, Ser: 1.64 mg/dL — ABNORMAL HIGH (ref 0.76–1.27)
GFR calc Af Amer: 51 mL/min/{1.73_m2} — ABNORMAL LOW (ref >59)
GFR calc non Af Amer: 44 mL/min/{1.73_m2} — ABNORMAL LOW (ref >59)
Glucose: 97 mg/dL (ref 65–99)
Potassium: 4.3 mmol/L (ref 3.5–5.2)
Sodium: 142 mmol/L (ref 134–144)

## 2018-11-11 LAB — MAGNESIUM: Magnesium: 1.9 mg/dL (ref 1.6–2.3)

## 2018-11-12 LAB — CBC
Hematocrit: 39.9 % (ref 37.5–51.0)
Hemoglobin: 13 g/dL (ref 13.0–17.7)
MCH: 27.7 pg (ref 26.6–33.0)
MCHC: 32.6 g/dL (ref 31.5–35.7)
MCV: 85 fL (ref 79–97)
Platelets: 207 10*3/uL (ref 150–450)
RBC: 4.7 x10E6/uL (ref 4.14–5.80)
RDW: 14.6 % (ref 11.6–15.4)
WBC: 6.6 10*3/uL (ref 3.4–10.8)

## 2018-11-12 LAB — IRON,TIBC AND FERRITIN PANEL
Ferritin: 62 ng/mL (ref 30–400)
Iron Saturation: 13 % — ABNORMAL LOW (ref 15–55)
Iron: 43 ug/dL (ref 38–169)
Total Iron Binding Capacity: 325 ug/dL (ref 250–450)
UIBC: 282 ug/dL (ref 111–343)

## 2018-11-13 ENCOUNTER — Telehealth (INDEPENDENT_AMBULATORY_CARE_PROVIDER_SITE_OTHER): Payer: Medicaid Other | Admitting: Physician Assistant

## 2018-11-13 ENCOUNTER — Other Ambulatory Visit: Payer: Self-pay

## 2018-11-13 ENCOUNTER — Telehealth: Payer: Self-pay | Admitting: *Deleted

## 2018-11-13 VITALS — BP 161/103 | HR 81 | Ht 73.0 in | Wt 311.0 lb

## 2018-11-13 DIAGNOSIS — G4733 Obstructive sleep apnea (adult) (pediatric): Secondary | ICD-10-CM

## 2018-11-13 DIAGNOSIS — I5042 Chronic combined systolic (congestive) and diastolic (congestive) heart failure: Secondary | ICD-10-CM | POA: Diagnosis not present

## 2018-11-13 DIAGNOSIS — I110 Hypertensive heart disease with heart failure: Secondary | ICD-10-CM

## 2018-11-13 DIAGNOSIS — I480 Paroxysmal atrial fibrillation: Secondary | ICD-10-CM | POA: Diagnosis not present

## 2018-11-13 DIAGNOSIS — E119 Type 2 diabetes mellitus without complications: Secondary | ICD-10-CM | POA: Diagnosis not present

## 2018-11-13 DIAGNOSIS — I10 Essential (primary) hypertension: Secondary | ICD-10-CM

## 2018-11-13 DIAGNOSIS — D509 Iron deficiency anemia, unspecified: Secondary | ICD-10-CM

## 2018-11-13 NOTE — Progress Notes (Signed)
Virtual Visit via Telephone Note   This visit type was conducted due to national recommendations for restrictions regarding the COVID-19 Pandemic (e.g. social distancing) in an effort to limit this patient's exposure and mitigate transmission in our community.  Due to his co-morbid illnesses, this patient is at least at moderate risk for complications without adequate follow up.  This format is felt to be most appropriate for this patient at this time.  The patient did not have access to video technology/had technical difficulties with video requiring transitioning to audio format only (telephone).  All issues noted in this document were discussed and addressed.  No physical exam could be performed with this format.  Please refer to the patient's chart for his  consent to telehealth for [facility].   Date:  11/14/2018   ID:  Terry Parks, DOB 1955-11-24, MRN [id number]  Patient Location: Home Provider Location: Office  PCP:  [name], MD  Cardiologist:  [name], MD Electrophysiologist:  None   Evaluation Performed:  Follow-Up Visit  Chief Complaint:  followup  History of Present Illness:    Terry Parks is a 63 y.o. male with past medical history of combined systolic and diastolic heart failure, hypertension, PAF on Xarelto, typical atrial flutter, severe obesity, obstructive sleep apnea, DM 2, history of nephrolithiasis and ureteral obstruction.  He has never had a cardiac catheterization nor has he ever had angina.  Myoview in 2013 showed a fixed anterior defect.  Myoview obtained in July 2016 showed EF 25%, diaphragmatic attenuation with severe global hypokinesis suggestive of nonischemic cardiomyopathy.    He was previously hospitalized in January 2019 with acute heart failure in the setting of AKI secondary to nephrolithiasis and ureteral obstruction.  He was diagnosed with atrial fibrillation during the admission and underwent cardioversion, however then had  atrial flutter.  He was placed on amiodarone and Xarelto.  Echocardiogram obtained on 06/11/2017 showed EF 30 to 35%, severe diffuse hypokinesis with no identifiable regional variation.  Last echocardiogram was obtained on 01/10/2018 which showed EF 35 to 40%, grade 2 DD, no significant valvular issue.   According to previous note, the cause of his cardiomyopathy was never entirely clear, but his ejection fraction has always improved once good blood pressure control was achieved.  Patient was last seen by Dr. Sallyanne Kuster on 10/26/2018, he had bilateral pitting edema up to his knee.  His dry weight was felt to be 285 pounds.  He was restarted on spironolactone 25 mg daily and torsemide increased to 40 mg twice daily.  Patient was contacted via virtual visit.  His blood pressure today is 161/103.  Heart rate 81.  He has been checking his blood pressure and weight on a daily basis.  Since he was started on higher dose of torsemide and spironolactone, his weight has been decreasing.  He says his previous weight was 317 pounds on his current home scale, after increasing the diuretic, he noticed his weight has dropped down to 311 pounds.  His blood pressure continue to be borderline high, I initially recommended increased dose of carvedilol, however patient adamantly refused to titrate blood pressure medication at this point.  He says his medications keep changing.  We further discussed salt restriction, fluid restriction and daily weight.  He is aware that his daily fluid intake recommendation is between 32 to 64 ounces.  Otherwise, he says he continued to have lower extremity edema but denies any orthopnea or PND.  He denies any recent chest pain.  I  recommended a 1 month clinic visit to do physical exam on this patient and evaluate his volume status.  The patient does not have symptoms concerning for COVID-19 infection (fever, chills, cough, or new shortness of breath).    Past Medical History:  Diagnosis Date  .  Abscessed tooth    top back large cavity no pain or drainage, one on bottom  pt pulled tooth 4-5 months ago, right top large hole in tooth  . AKI (acute kidney injury) ([place])   . Allergic rhinitis   . Anemia   . Anxiety   . Asthma   . Atrial fibrillation ([place])   . BPH (benign prostatic hypertrophy)    Massive BPH noted on cystoscopy 1/23/ 2012 by Dr. Risa Grill.  . Cancer [facility])    prostate cancer 2019  . Cardiomyopathy ([place])   . CHF (congestive heart failure) ([place])   . Cough 03/30/2012  . Depression   . Diabetes mellitus 04/08/2008   type 2  . Foley catheter in place 07-05-17 placed  . Headache(784.0)    hx migraines none recent  . Hyperlipemia   . Hypertension   . Hypertensive cardiopathy 03/01/2006   2-D echocardiogram 02/01/2012 showed moderate LVH, mildly to moderately reduced left ventricular systolic function with an estimated ejection fraction of 40-45%, and diffuse hypokinesis.  A nuclear medicine stress study done 01/31/2012 showed no reversible ischemia, a small mid anterior wall fixed defect/infarct, and ejection fraction 42%.      . Neck pain   . Nephrolithiasis 05/29/2010   CT scan of abdomen/pelvis on 05/29/2010 showed an obstructing approximate 1-2 mm calculus at the left UVJ, and an approximate 1-2 mm left lower pole renal calculus.   Patient had continuing severe pain , and an elevation of his serum creatinine to a value of 1.75 on 06/06/2010.  Patient underwent cystoscopy on 06/08/2010 by Dr. Risa Grill, but attempts at retrograde pyelogram and ureteroscopy were unsucc  . Numbness 01/08/2018  . Obstructive sleep apnea 03/06/2008   Sleep study 03/06/08 showed severe OSA/hypopnea syndrome, with successful CPAP titration to 13 CWP using a medium ResMed Mirage Quattro full face mask with heated humidifier.   . Rash 04/17/2014  . Renal calculus 05/29/2010   CT scan of abdomen/pelvis on 05/29/2010 showed an obstructing approximate 1-2 mm calculus at the left UVJ, and an approximate 1-2 mm  left lower pole renal calculus.   Patient had continuing severe pain , and an elevation of his serum creatinine to a value of 1.75 on 06/06/2010.  The stone had apparently passed and was not seen on repeat CT 06/08/2010.  . Sleep apnea    haven't use cpap in 2 years  . Tooth pain 10/29/2017  . Urinary straining 11/02/2016   Past Surgical History:  Procedure Laterality Date  . big toe nails removed Bilateral 20 yrs ago  . CARDIOVERSION N/A 06/08/2017   Procedure: CARDIOVERSION;  Surgeon: Josue Hector, MD;  Location: [facility] ENDOSCOPY;  Service: Cardiovascular;  Laterality: N/A;  . CYSTOSCOPY W/ RETROGRADES    . IR RADIOLOGIST EVAL & MGMT  02/02/2018  . LYMPHADENECTOMY Bilateral 10/06/2017   Procedure: LYMPHADENECTOMY;  Surgeon: Lucas Mallow, MD;  Location: [facility] ORS;  Service: Urology;  Laterality: Bilateral;  . ROBOT ASSISTED LAPAROSCOPIC RADICAL PROSTATECTOMY N/A 10/06/2017   Procedure: XI ROBOTIC ASSISTED LAPAROSCOPIC RADICAL PROSTATECTOMY;  Surgeon: Lucas Mallow, MD;  Location: [facility] ORS;  Service: Urology;  Laterality: N/A;  . TRANSURETHRAL RESECTION OF BLADDER TUMOR N/A 07/13/2017   Procedure:  TRANSURETHRAL RESECTION OF PROSTATE;  Surgeon: Lucas Mallow, MD;  Location: [facility] ORS;  Service: Urology;  Laterality: N/A;     Current Meds  Medication Sig  . Accu-Chek Softclix Lancets lancets Check blood sugar three times a day as instructed  . acetaminophen (TYLENOL) 325 MG tablet Take 325-650 mg by mouth every 6 (six) hours as needed (for pain).  [name] amiodarone (PACERONE) 200 MG tablet Take 1 tablet (200 mg total) by mouth daily.  [name] amLODipine-atorvastatin (CADUET) 5-40 MG tablet Take 1 tablet by mouth daily.  . Blood Glucose Monitoring Suppl (ACCU-CHEK AVIVA) device Use as instructed  . cholecalciferol (VITAMIN D3) 25 MCG (1000 UT) tablet Take 1 tablet (1,000 Units total) by mouth daily.  . DULoxetine (CYMBALTA) 60 MG capsule Take 1 capsule (60 mg total) by mouth daily.  [name] gabapentin  (NEURONTIN) 300 MG capsule Take 1 capsule (300 mg total) by mouth 2 (two) times daily.  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 hydrocortisone cream 0.5 % Apply 1 application topically 2 (two) times daily.  . Insulin Degludec-Liraglutide (XULTOPHY) 100-3.6 UNIT-MG/ML SOPN Inject 50 Units into the skin daily.  . Insulin Pen Needle (CARETOUCH PEN NEEDLES) 31G X 6 MM MISC 1 pen by Does not apply route at bedtime.  . isosorbide mononitrate (IMDUR) 30 MG 24 hr tablet Take 1 tablet (30 mg total) by mouth daily.  [name] losartan (COZAAR) 50 MG tablet Take 1 tablet (50 mg total) by mouth daily.  . Misc. Devices MISC by Does not apply route. C-PAP  . nystatin cream (MYCOSTATIN) Apply 1 application topically See admin instructions. Apply to arms and legs 2 times a day  . omeprazole (PRILOSEC) 40 MG capsule Take 1 capsule (40 mg total) by mouth daily.  . polyethylene glycol (MIRALAX / GLYCOLAX) packet Take 17 g by mouth daily.  . rivaroxaban (XARELTO) 20 MG TABS tablet Take 1 tablet (20 mg total) by mouth daily with breakfast.  . spironolactone (ALDACTONE) 25 MG tablet Take 1 tablet (25 mg total) by mouth daily.  [name] torsemide (DEMADEX) 20 MG tablet Take 2 tablets (40 mg total) by mouth 2 (two) times daily.  . [DISCONTINUED] amLODipine (NORVASC) 5 MG tablet Take 1 tablet (5 mg total) by mouth daily.     Allergies:   Lisinopril   Social History   Tobacco Use  . Smoking status: Never Smoker  . Smokeless tobacco: Never Used  Substance Use Topics  . Alcohol use: No    Alcohol/week: 0.0 standard drinks  . Drug use: No     Family Hx: The patient's family history includes Breast cancer in his mother; Diabetes in his maternal grandmother; Hypertension in his father. There is no history of Colon cancer, Prostate cancer, Heart attack, Esophageal cancer, Inflammatory bowel disease, Liver disease, Pancreatic cancer, Rectal cancer, or  Stomach cancer.  ROS:   Please see the history of present illness.     All other systems reviewed and are negative.   Prior CV studies:   The following studies were reviewed today:  Echo 01/10/2018 LV EF: 35% -   40% Study Conclusions  - Left ventricle: The cavity size was normal. Wall thickness was   increased in a pattern of mild LVH. Systolic function was   moderately reduced. The estimated ejection fraction was in the   range of 35% to 40%. Diffuse hypokinesis. Features are consistent   with a pseudonormal  left ventricular filling pattern, with   concomitant abnormal relaxation and increased filling pressure   (grade 2 diastolic dysfunction). - Aortic valve: There was no stenosis. - Mitral valve: There was no significant regurgitation. - Right ventricle: The cavity size was mildly dilated. Systolic   function was mildly reduced. - Right atrium: The atrium was moderately dilated. - Pulmonary arteries: No complete TR doppler jet so unable to   estimate PA systolic pressure. - Systemic veins: IVC measured 2.1 cm with > 50% respirophasic   variation, suggesting RA pressure 8 mmHg. - Pericardium, extracardiac: A trivial pericardial effusion was   identified.  Impressions:  - Normal LV size with mild LV hypertrophy. EF 35-40%, diffuse   hypokinesis. Moderate diastolic dysfunction. Mildly dilated RV   with mildly decreased systolic function. No significant valvular   abnormalities.   Labs/Other Tests and Data Reviewed:    EKG:  An ECG dated 01/09/2018 was personally reviewed today and demonstrated:  Sinus tachycardia.  Recent Labs: 01/11/2018: TSH 1.468 02/08/2018: B Natriuretic Peptide 761.4 02/13/2018: ALT 31 11/10/2018: BUN 10; Creatinine, Ser 1.64; Hemoglobin 13.0; Magnesium 1.9; Platelets 207; Potassium 4.3; Sodium 142   Recent Lipid Panel Lab Results  Component Value Date/Time   CHOL 101 01/09/2018 05:09 AM   CHOL 130 08/22/2017 11:51 AM   TRIG 57 01/09/2018  05:09 AM   HDL 26 (L) 01/09/2018 05:09 AM   HDL 39 (L) 08/22/2017 11:51 AM   CHOLHDL 3.9 01/09/2018 05:09 AM   LDLCALC 64 01/09/2018 05:09 AM   LDLCALC 75 08/22/2017 11:51 AM    Wt Readings from Last 3 Encounters:  11/13/18 (!) 311 lb (141.1 kg)  11/03/18 299 lb (135.6 kg)  10/26/18 299 lb (135.6 kg)     Objective:    Vital Signs:  BP (!) 161/103   Pulse 81   Ht 6\' 1"  (1.854 m)   Wt (!) 311 lb (141.1 kg)   BMI 41.03 kg/m    VITAL SIGNS:  reviewed  ASSESSMENT & PLAN:    1. Hypertension: Patient's blood pressure continue to be elevated despite recent addition of spironolactone.  I recommended increase carvedilol further, however patient adamantly refused and says his blood pressure medication kept changing.  He wished to do a trial on weight loss and dietary control.  He is aware that he need to avoid salt and limit fluid intake to between 32 to 64 ounces.  2. Chronic combined systolic and diastolic heart failure: He denies any recent lower extremity edema, orthopnea or PND.  Continue Demadex and spironolactone  3. PAF: On amiodarone and carvedilol.  4. DM 2: Managed per primary care provider  5. Obstructive sleep apnea: will need to pursue further weight loss.  COVID-19 Education: The signs and symptoms of COVID-19 were discussed with the patient and how to seek care for testing (follow up with PCP or arrange E-visit).  The importance of social distancing was discussed today.  Time:   Today, I have spent 14 minutes with the patient with telehealth technology discussing the above problems.     Medication Adjustments/Labs and Tests Ordered: Current medicines are reviewed at length with the patient today.  Concerns regarding medicines are outlined above.   Tests Ordered: No orders of the defined types were placed in this encounter.   Medication Changes: No orders of the defined types were placed in this encounter.   Follow Up:  Virtual Visit in 2 month(s)  Signed,  Almyra Deforest, [place]  11/14/2018 12:24 PM  [facility]

## 2018-11-13 NOTE — Telephone Encounter (Addendum)
Left a message for the patient to call back to make an appointment with Dr. Sallyanne Kuster in one month per office visit with Almyra Deforest, PA  Also needs results: Notes recorded by [name], MD on 11/11/2018 at 11:31 AM EDT  Normal electrolytes. Kidney function at baseline, moderately abnormal.

## 2018-11-13 NOTE — Patient Instructions (Addendum)
Medication Instructions:   Your physician recommends that you continue on your current medications as directed. Please refer to the Current Medication list given to you today.  If you need a refill on your cardiac medications before your next appointment, please call your pharmacy.   Lab work:  NONE ordered at this time of appointment   If you have labs (blood work) drawn today and your tests are completely normal, you will receive your results only by: [name] MyChart Message (if you have MyChart) OR . A paper copy in the mail If you have any lab test that is abnormal or we need to change your treatment, we will call you to review the results.  Testing/Procedures:  NONE ordered at this time of appointment   Follow-Up: At [facility], you and your health needs are our priority.  As part of our continuing mission to provide you with exceptional heart care, we have created designated Provider Care Teams.  These Care Teams include your primary Cardiologist (physician) and Advanced Practice Providers (APPs -  Physician Assistants and Nurse Practitioners) who all work together to provide you with the care you need, when you need it. You will need a follow up appointment in 1 months.  Please call our office 2 months in advance to schedule this appointment.  You may see [name], MD or one of the following Advanced Practice Providers on your designated Care Team:   Kerin Ransom, PA-C [place], [place] . Sande Rives, PA-C  Any Other Special Instructions Will Be Listed Below (If Applicable).  Monitor your Blood pressure and weight daily  Limit your daily fluid intake to 32-64 oz  NO SALT   BRING in your medication bottles at your next appointment      Heart Failure Eating Plan Heart failure, also called congestive heart failure, occurs when your heart does not pump blood well enough to meet your body's needs for oxygen-rich blood. Heart failure is a long-term (chronic)  condition. Living with heart failure can be challenging. However, following your health care provider's instructions about a healthy lifestyle and working with a diet and nutrition specialist (dietitian) to choose the right foods may help to improve your symptoms. What are tips for following this plan? Reading food labels  Check food labels for the amount of sodium per serving. Choose foods that have less than 140 mg (milligrams) of sodium in each serving.  Check food labels for the number of calories per serving. This is important if you need to limit your daily calorie intake to lose weight.  Check food labels for the serving size. If you eat more than one serving, you will be eating more sodium and calories than what is listed on the label.  Look for foods that are labeled as "sodium-free," "very low sodium," or "low sodium." ? Foods labeled as "reduced sodium" or "lightly salted" may still have more sodium than what is recommended for you. Cooking  Avoid adding salt when cooking. Ask your health care provider or dietitian before using salt substitutes.  Season food with salt-free seasonings, spices, or herbs. Check the label of seasoning mixes to make sure they do not contain salt.  Cook with heart-healthy oils, such as olive, canola, soybean, or sunflower oil.  Do not fry foods. Cook foods using low-fat methods, such as baking, boiling, grilling, and broiling.  Limit unhealthy fats when cooking by: ? Removing the skin from poultry, such as chicken. ? Removing all visible fats from meats. ? Skimming  the fat off from stews, soups, and gravies before serving them. Meal planning   Limit your intake of: ? Processed, canned, or pre-packaged foods. ? Foods that are high in trans fat, such as fried foods. ? Sweets, desserts, sugary drinks, and other foods with added sugar. ? Full-fat dairy products, such as whole milk.  Eat a balanced diet that includes: ? 4-5 servings of fruit each  day and 4-5 servings of vegetables each day. At each meal, try to fill half of your plate with fruits and vegetables. ? Up to 6-8 servings of whole grains each day. ? Up to 2 servings of lean meat, poultry, or fish each day. One serving of meat is equal to 3 oz. This is about the same size as a deck of cards. ? 2 servings of low-fat dairy each day. ? Heart-healthy fats. Healthy fats called omega-3 fatty acids are found in foods such as flaxseed and cold-water fish like sardines, salmon, and mackerel.  Aim to eat 25-35 g (grams) of fiber a day. Foods that are high in fiber include apples, broccoli, carrots, beans, peas, and whole grains.  Do not add salt or condiments that contain salt (such as soy sauce) to foods before eating.  When eating at a restaurant, ask that your food be prepared with less salt or no salt, if possible.  Try to eat 2 or more vegetarian meals each week.  Eat more home-cooked food and eat less restaurant, buffet, and fast food. General information  Do not eat more than 2,300 mg of salt (sodium) a day. The amount of sodium that is recommended for you may be lower, depending on your condition.  Maintain a healthy body weight as directed. Ask your health care provider what a healthy weight is for you. ? Check your weight every day. ? Work with your health care provider and dietitian to make a plan that is right for you to lose weight or maintain your current weight.  Limit how much fluid you drink. Ask your health care provider or dietitian how much fluid you can have each day.  Limit or avoid alcohol as told by your health care provider or dietitian. Recommended foods The items listed may not be a complete list. Talk with your dietitian about what dietary choices are best for you. Fruits All fresh, frozen, and canned fruits. Dried fruits, such as raisins, prunes, and cranberries. Vegetables All fresh vegetables. Vegetables that are frozen without sauce or added  salt. Low-sodium or sodium-free canned vegetables. Grains Bread with less than 80 mg of sodium per slice. Whole-wheat pasta, quinoa, and brown rice. Oats and oatmeal. Barley. [place]. Grits and cream of wheat. Whole-grain and whole-wheat cold cereal. Meats and other protein foods Lean cuts of meat. Skinless chicken and [place]. Fish with high omega-3 fatty acids, such as salmon, sardines, and other cold-water fishes. Eggs. Dried beans, peas, and edamame. Unsalted nuts and nut butters. Dairy Low-fat or nonfat (skim) milk and dried milk. Rice milk, soy milk, and almond milk. Low-fat or nonfat yogurt. Small amounts of reduced-sodium block cheese. Low-sodium cottage cheese. Fats and oils Olive, canola, soybean, flaxseed, or sunflower oil. Avocado. Sweets and desserts Apple sauce. Granola bars. Sugar-free pudding and gelatin. Frozen fruit bars. Seasoning and other foods Fresh and dried herbs. Lemon or lime juice. Vinegar. Low-sodium ketchup. Salt-free marinades, salad dressings, sauces, and seasonings. The items listed above may not be a complete list of foods and beverages you can eat. Contact a dietitian for more information.  Foods to avoid The items listed may not be a complete list. Talk with your dietitian about what dietary choices are best for you. Fruits Fruits that are dried with sodium-containing preservatives. Vegetables Canned vegetables. Frozen vegetables with sauce or seasonings. Creamed vegetables. [place] fries. Onion rings. Pickled vegetables and sauerkraut. Grains Bread with more than 80 mg of sodium per slice. Hot or cold cereal with more than 140 mg sodium per serving. Salted pretzels and crackers. Pre-packaged breadcrumbs. Bagels, croissants, and biscuits. Meats and other protein foods Ribs and chicken wings. Bacon, ham, pepperoni, bologna, salami, and packaged luncheon meats. Hot dogs, bratwurst, and sausage. Canned meat. Smoked meat and fish. Salted nuts and seeds. Dairy Whole  milk, half-and-half, and cream. Buttermilk. Processed cheese, cheese spreads, and cheese curds. Regular cottage cheese. Feta cheese. Shredded cheese. String cheese. Fats and oils Butter, lard, shortening, ghee, and bacon fat. Canned and packaged gravies. Seasoning and other foods Onion salt, garlic salt, table salt, and sea salt. Marinades. Regular salad dressings. Relishes, pickles, and olives. Meat flavorings and tenderizers, and bouillon cubes. Horseradish, ketchup, and mustard. Worcestershire sauce. Teriyaki sauce, soy sauce (including reduced sodium). Hot sauce and Tabasco sauce. Steak sauce, fish sauce, oyster sauce, and cocktail sauce. Taco seasonings. Barbecue sauce. Tartar sauce. The items listed above may not be a complete list of foods and beverages you should avoid. Contact a dietitian for more information. Summary  A heart failure eating plan includes changes that limit your intake of sodium and unhealthy fat, and it may help you lose weight or maintain a healthy weight. Your health care provider may also recommend limiting how much fluid you drink.  Most people with heart failure should eat no more than 2,300 mg of salt (sodium) a day. The amount of sodium that is recommended for you may be lower, depending on your condition.  Contact your health care provider or dietitian before making any major changes to your diet. This information is not intended to replace advice given to you by your health care provider. Make sure you discuss any questions you have with your health care provider. Document Released: 09/17/2016 Document Revised: 06/29/2018 Document Reviewed: 09/17/2016 Elsevier Patient Education  [employer].

## 2018-11-14 ENCOUNTER — Other Ambulatory Visit: Payer: Self-pay

## 2018-11-14 DIAGNOSIS — D509 Iron deficiency anemia, unspecified: Secondary | ICD-10-CM

## 2018-11-15 ENCOUNTER — Other Ambulatory Visit: Payer: Medicaid Other

## 2018-11-15 ENCOUNTER — Encounter: Payer: Self-pay | Admitting: Dietician

## 2018-11-15 ENCOUNTER — Ambulatory Visit: Payer: Medicaid Other | Admitting: Dietician

## 2018-11-15 ENCOUNTER — Other Ambulatory Visit: Payer: Self-pay

## 2018-11-15 NOTE — Telephone Encounter (Signed)
Completed CMN for diabetic shoes faxed to [employer] by front office 11/14/2018. Hubbard Hartshorn, RN, BSN

## 2018-11-15 NOTE — Progress Notes (Signed)
Phone follow up Diabetes Self-Management Education & Support  Visit Type:   follow up  Appt. Start Time: 1048 Appt. End Time: 2395 with ~ 10 minute interruption  11/15/2018  [name], identified by name and date of birth, is a 63 y.o. male with a diagnosis of Diabetes:  [name]   Type 2 diabetes  ASSESSMENT  Mr. [name] reports feeling better lately.  He says his dentist has  not opened. Teeth not hurting sleeping better,not wearing cpap. breathing better. Feet still swollen.   Self Monitoring:  Blood sugar: everyday first thing in morning.133 this am  weight 305# Medicine:   50 once a day Xultophy- he likes the pen , he stopped his metformin two days ago because of the recall  Meal planning: limited fluid, no salt per his cardiologist, he says he is doing better with those goals. He drinks soda-some of a 2 liter bottle or lemonade- 8 oz/day daily, 8 oz water with pills; uses garlic and seasoning salt. Has garlic powder, mrs dash in his house. Denies need for assistance with obtaining food going to cook cod today. bologna sandwich with mayo advised peanut & jelly or banana; trail mix, lowfat milk, chicken Vegetables- 2-3 times/week- needs to buy more Fruit- 3-4 times a week- ran out about 2 weeks ago, but remembered that he has bananas in freezer.  Smoothie- has not done yet Physical activity: waking around the building, not like I used to   He needs ophthalmology referral, recommend podiatry referral and is working on getting an appointment with a dentist to get his teeth fixed so he can wear his CPAP.  Learning Objective:  Patient will have a greater understanding of diabetes self-management. Patient plan is to attend individual sessions per assessed needs and concerns.  Plan: put salt seasoning away in cabinet, put Mrs. Dash and garlic powder on table, continue trying to drink more water (less soda and lemonade)   Expected Outcomes:   improvement in symptoms and blood  sugar Education material provided: no If problems or questions, patient to contact team via:  Phone Future DSME appointment: -   1 month Debera Lat, RD 11/15/2018 12:26 PM.

## 2018-11-16 ENCOUNTER — Ambulatory Visit (HOSPITAL_COMMUNITY)
Admission: RE | Admit: 2018-11-16 | Discharge: 2018-11-16 | Disposition: A | Discharge status: Home / Self Care | Payer: Medicaid Other | Source: Ambulatory Visit | Attending: Gastroenterology | Admitting: Gastroenterology

## 2018-11-16 ENCOUNTER — Other Ambulatory Visit: Payer: Self-pay

## 2018-11-16 ENCOUNTER — Telehealth: Payer: Self-pay | Admitting: *Deleted

## 2018-11-16 DIAGNOSIS — D509 Iron deficiency anemia, unspecified: Secondary | ICD-10-CM | POA: Diagnosis not present

## 2018-11-16 MED ORDER — SODIUM CHLORIDE 0.9 % IV SOLN
INTRAVENOUS | Status: DC | PRN
  Administered 2018-11-16: 250 mL via INTRAVENOUS

## 2018-11-16 MED ORDER — SODIUM CHLORIDE 0.9 % IV SOLN
510.0000 mg | INTRAVENOUS | Status: DC
  Administered 2018-11-16: 510 mg via INTRAVENOUS
  Filled 2018-11-16: qty 17

## 2018-11-16 NOTE — Progress Notes (Signed)
Patient received Feraheme via PIV. Observed for at least 30 minutes post infusion.Tolerated well, vitals stable, discharge instructions given, verbalized understanding. Patient alert, oriented and ambulatory at the time of discharge.  

## 2018-11-16 NOTE — Progress Notes (Signed)
Patient should have an acc appointment for optimal blood pressure control

## 2018-11-16 NOTE — Telephone Encounter (Addendum)
-----   Message from [name], MD sent at 11/16/2018  7:17 AM EDT -----  Patient should have an acc appointment for optimal blood pressure control

## 2018-11-16 NOTE — Discharge Instructions (Signed)

## 2018-11-16 NOTE — Telephone Encounter (Signed)
Patient made aware of results and verbalized understanding.  Appointment made for 12/26/2018 with Dr. Sallyanne Kuster.

## 2018-11-20 ENCOUNTER — Other Ambulatory Visit: Payer: Self-pay

## 2018-11-20 ENCOUNTER — Ambulatory Visit: Payer: Medicaid Other

## 2018-11-20 ENCOUNTER — Other Ambulatory Visit: Payer: Self-pay | Admitting: Internal Medicine

## 2018-11-20 DIAGNOSIS — R079 Chest pain, unspecified: Secondary | ICD-10-CM

## 2018-11-20 DIAGNOSIS — I10 Essential (primary) hypertension: Secondary | ICD-10-CM

## 2018-11-20 DIAGNOSIS — I4819 Other persistent atrial fibrillation: Secondary | ICD-10-CM

## 2018-11-20 MED FILL — XULTOPHY 100 UNIT-3.6MG/ML: 100-3.6 | 30 days supply | Qty: 15 | Fill #0

## 2018-11-20 MED FILL — ACCU-CHEK SOFTCLIX LANCETS: 33 days supply | Qty: 100 | Fill #0

## 2018-11-20 NOTE — Telephone Encounter (Signed)
Needs refill on   Insulin Degludec-Liraglutide (XULTOPHY) 100-3.6 UNIT-MG/ML SOPN  Accu-Chek Softclix Lancets lancets  ;pt contact [place], [place] - [address] [facility].

## 2018-11-20 NOTE — Telephone Encounter (Signed)
These both have refills [facility], spoke to susan, she will get ready and let pt know

## 2018-11-21 NOTE — Telephone Encounter (Signed)
Sounds good, Jennie. I sent the refill requests. I believe we can sign off now since BG improved/stable. Dr. Maricela Bo, please let us know if there is anything else we can do. I know patient has a history of challenges managing meds, so we are happy to help again if needed but trying not to bother.  Thank you

## 2018-11-21 NOTE — Telephone Encounter (Signed)
Returning patient call from yesterday. Needs refills on Xarelto, carvedilol, and losartan. Xarelto and carvedilol doses will stay the same, but we will increase losartan from 50 to 100mg  daily due to high microalbumin/creatinine ratio and increased BP. He would like to pick up these medications today.  He also asked if [facility] is delivering diabetic testing supplies. They are not answering the phone, but I will try again later.  Sugars have been under 100 in the morning (fasting). He has only had two readings of 150 in the morning in the last month, but the rest of his sugars have been within goal. No sugars under 70- lowest has been 88.

## 2018-11-22 MED ORDER — LOSARTAN POTASSIUM 100 MG PO TABS: 100.0000 mg | ORAL_TABLET | Freq: Every day | ORAL | 0 refills | Status: DC

## 2018-11-22 MED ORDER — RIVAROXABAN 20 MG PO TABS: 20.0000 mg | ORAL_TABLET | Freq: Every day | ORAL | 0 refills | Status: DC

## 2018-11-22 MED ORDER — CARVEDILOL 12.5 MG PO TABS: 12.5000 mg | ORAL_TABLET | Freq: Two times a day (BID) | ORAL | 3 refills | Status: DC

## 2018-11-22 MED FILL — XARELTO 20 MG TABLET: 20 | 30 days supply | Qty: 30 | Fill #1

## 2018-11-22 MED FILL — LOSARTAN POTASSIUM 50 MG TA: 50 | 30 days supply | Qty: 30 | Fill #1

## 2018-11-22 MED FILL — CARVEDILOL 12.5 MG TABLET: 12.5 | 30 days supply | Qty: 60 | Fill #1

## 2018-11-22 NOTE — Telephone Encounter (Signed)
Hi All, just wanted to add that patient told me on 11/15/18 visit that he had stopped his metformin due to the recall. I wanted to run this by pharmacy before advising him on what to do.

## 2018-11-22 NOTE — Telephone Encounter (Signed)
Thank you all so much!

## 2018-11-23 ENCOUNTER — Ambulatory Visit (HOSPITAL_COMMUNITY)
Admission: RE | Admit: 2018-11-23 | Discharge: 2018-11-23 | Disposition: A | Discharge status: Home / Self Care | Payer: Medicaid Other | Source: Ambulatory Visit | Attending: Gastroenterology | Admitting: Gastroenterology

## 2018-11-23 ENCOUNTER — Telehealth: Payer: Self-pay | Admitting: *Deleted

## 2018-11-23 ENCOUNTER — Other Ambulatory Visit: Payer: Self-pay

## 2018-11-23 DIAGNOSIS — D509 Iron deficiency anemia, unspecified: Secondary | ICD-10-CM | POA: Diagnosis not present

## 2018-11-23 MED ORDER — SODIUM CHLORIDE 0.9 % IV SOLN
INTRAVENOUS | Status: DC | PRN
  Administered 2018-11-23: 11:00:00 250 mL via INTRAVENOUS

## 2018-11-23 MED ORDER — SODIUM CHLORIDE 0.9 % IV SOLN
510.0000 mg | Freq: Once | INTRAVENOUS | Status: AC
  Administered 2018-11-23: 510 mg via INTRAVENOUS
  Filled 2018-11-23: qty 17

## 2018-11-23 MED FILL — ACCU-CHEK AVIVA PLUS STRP: 33 days supply | Qty: 100 | Fill #0

## 2018-11-23 NOTE — Discharge Instructions (Signed)

## 2018-11-23 NOTE — Progress Notes (Signed)
PATIENT CARE CENTER NOTE  Diagnosis: Iron Deficiency Anemia   Provider: [name], MD   Procedure: IV Feraheme   Note: Patient received second Feraheme infusion. Tolerated well. Observed patient for 30 minutes post-infusion. Vital signs stable. Discharge instructions given. Patient alert, oriented and ambulatory at discharge.

## 2018-11-23 NOTE — Telephone Encounter (Signed)
Call from pt - stated he need refills on test strips. Last time he picked up lancets. RX was refilled in March x 11 Rf's - informed to call [facility] for the refill ; and to call back if he has any problems.  He verbalized understanding.

## 2018-11-24 ENCOUNTER — Ambulatory Visit (INDEPENDENT_AMBULATORY_CARE_PROVIDER_SITE_OTHER): Payer: Medicaid Other | Admitting: Gastroenterology

## 2018-11-24 DIAGNOSIS — K2100 Gastro-esophageal reflux disease with esophagitis, without bleeding: Secondary | ICD-10-CM

## 2018-11-24 DIAGNOSIS — E611 Iron deficiency: Secondary | ICD-10-CM | POA: Diagnosis not present

## 2018-11-24 DIAGNOSIS — Z8719 Personal history of other diseases of the digestive system: Secondary | ICD-10-CM

## 2018-11-24 MED ORDER — OMEPRAZOLE 40 MG PO CPDR: 40.0000 mg | DELAYED_RELEASE_CAPSULE | Freq: Every day | ORAL | 6 refills | Status: DC

## 2018-11-24 MED ORDER — PEG 3350-KCL-NA BICARB-NACL 420 G PO SOLR: 4000.0000 mL | Freq: Once | ORAL | 0 refills | Status: AC

## 2018-11-24 MED FILL — OMEPRAZOLE DR 40 MG CAPSULE: 40 | 30 days supply | Qty: 30 | Fill #0

## 2018-11-24 NOTE — Progress Notes (Signed)
[facility] [place] VISIT   Primary Care Provider [name], MD [place] [address] [phone]  Patient Profile: Terry Parks is a 63 y.o. male with a pmh significant for Afib (on Xarelto), HTN, HLD, OSA, BPH, MDD, Nephrolithiasis, DM, HFrEF, GERD, esophagitis, IDA.  The patient presents to the [facility] for an evaluation and management of problem(s) noted below:  Problem List 1. Iron deficiency   2. Gastroesophageal reflux disease with esophagitis   3. Hx of esophagitis     I connected with  Terry Parks on 11/24/18. I verified that I was speaking with the correct person using two identifiers. Due to the COVID-19 Pandemic, this service was provided via telemedicine using Audio-only. Interactive audio and video telecommunications were attempted between this provider and patient, however failed, due to patient not having access to video capability and thus to provide timely and excellent care, we continued and completed visit with audio only. The patient was located at home. The provider was located in the office. The patient did consent to this visit and is aware of charges through their insurance as well as the limitations of evaluation and management by telemedicine. Other persons participating in this telemedicine service were none. Time spent on visit was 26 minutes on phone and in discussion and coordination of care.   History of Present Illness: Please see initial consultation note for full details of HPI.  Interval History Since her last visit the patient has obtained his laboratories showing improvement in his blood count but still having some iron deficiency.  He has subsequently undergone 2 IV iron infusions.  Overall, he feels that things are improving and his stamina is improved.  He is taking omeprazole once daily.  By doing this his GERD is better.  Working on trying to maintain good control of his  blood pressure.  He describes no significant abdominal pains.  He denies any dysphagia or odynophagia.  Stools are unremarkable per him other than being dark because of oral iron.  GI Review of Systems Positive as above Negative pain, early satiety, nausea, vomiting, change in bowel habits, hematochezia   Review of Systems General: Denies fevers/chills Cardiovascular: Denies chest pain Pulmonary: Shortness of breath improved and continuing to improve Gastroenterological: See HPI Genitourinary: Denies darkened urine or hematuria Hematological: Bruising/bleeding unchanged due to anticoagulation Dermatological: Denies jaundice Psychological: Mood is stable   Medications Current Outpatient Medications  Medication Sig Dispense Refill   Accu-Chek Softclix Lancets lancets Check blood sugar three times a day as instructed 300 each 3   acetaminophen (TYLENOL) 325 MG tablet Take 325-650 mg by mouth every 6 (six) hours as needed (for pain).     amiodarone (PACERONE) 200 MG tablet Take 1 tablet (200 mg total) by mouth daily. 90 tablet 1   amLODipine-atorvastatin (CADUET) 5-40 MG tablet Take 1 tablet by mouth daily.     Blood Glucose Monitoring Suppl (ACCU-CHEK AVIVA) device Use as instructed 1 each 0   carvedilol (COREG) 12.5 MG tablet Take 1 tablet (12.5 mg total) by mouth 2 (two) times daily with a meal. 180 tablet 3   cholecalciferol (VITAMIN D3) 25 MCG (1000 UT) tablet Take 1 tablet (1,000 Units total) by mouth daily. 30 tablet 11   DULoxetine (CYMBALTA) 60 MG capsule Take 1 capsule (60 mg total) by mouth daily. 90 capsule 3   ferrous sulfate 325 (65 FE) MG EC tablet Take 1 tablet (325 mg total) by mouth daily. (Patient taking differently: Take 325  mg by mouth daily. ) 90 tablet 0   gabapentin (NEURONTIN) 300 MG capsule Take 1 capsule (300 mg total) by mouth 2 (two) times daily. 60 capsule 3   glucose blood (ACCU-CHEK AVIVA) test strip Use to check blood sugar 3 times daily diag code  E11.42. insulin dependent 300 each 11   hydrocortisone cream 0.5 % Apply 1 application topically 2 (two) times daily. 30 g 0   Insulin Degludec-Liraglutide (XULTOPHY) 100-3.6 UNIT-MG/ML SOPN Inject 50 Units into the skin daily. 5 pen 11   Insulin Pen Needle (CARETOUCH PEN NEEDLES) 31G X 6 MM MISC 1 pen by Does not apply route at bedtime. 100 each 3   isosorbide mononitrate (IMDUR) 30 MG 24 hr tablet Take 1 tablet (30 mg total) by mouth daily. 90 tablet 3   losartan (COZAAR) 100 MG tablet Take 1 tablet (100 mg total) by mouth daily. 90 tablet 0   metformin (FORTAMET) 1000 MG (OSM) 24 hr tablet Take 2 tablets (2,000 mg total) by mouth daily with breakfast. 180 tablet 1   Misc. Devices MISC by Does not apply route. C-PAP     nystatin cream (MYCOSTATIN) Apply 1 application topically See admin instructions. Apply to arms and legs 2 times a day 30 g 1   omeprazole (PRILOSEC) 40 MG capsule Take 1 capsule (40 mg total) by mouth daily. 30 capsule 6   polyethylene glycol (MIRALAX / GLYCOLAX) packet Take 17 g by mouth daily. 14 each 0   rivaroxaban (XARELTO) 20 MG TABS tablet Take 1 tablet (20 mg total) by mouth daily with breakfast. 90 tablet 0   spironolactone (ALDACTONE) 25 MG tablet Take 1 tablet (25 mg total) by mouth daily. 30 tablet 2   torsemide (DEMADEX) 20 MG tablet Take 2 tablets (40 mg total) by mouth 2 (two) times daily. 120 tablet 2   No current facility-administered medications for this visit.     Allergies Allergies  Allergen Reactions   Lisinopril Cough    Histories Past Medical History:  Diagnosis Date   Abscessed tooth    top back large cavity no pain or drainage, one on bottom  pt pulled tooth 4-5 months ago, right top large hole in tooth   AKI (acute kidney injury) ([place])    Allergic rhinitis    Anemia    Anxiety    Asthma    Atrial fibrillation (HCC)    BPH (benign prostatic hypertrophy)    Massive BPH noted on cystoscopy 1/23/ 2012 by Dr. Risa Grill.     Cancer [facility])    prostate cancer 2019   Cardiomyopathy [facility])    CHF (congestive heart failure) ([place])    Cough 03/30/2012   Depression    Diabetes mellitus 04/08/2008   type 2   Foley catheter in place 07-05-17 placed   Headache(784.0)    hx migraines none recent   Hyperlipemia    Hypertension    Hypertensive cardiopathy 03/01/2006   2-D echocardiogram 02/01/2012 showed moderate LVH, mildly to moderately reduced left ventricular systolic function with an estimated ejection fraction of 40-45%, and diffuse hypokinesis.  A nuclear medicine stress study done 01/31/2012 showed no reversible ischemia, a small mid anterior wall fixed defect/infarct, and ejection fraction 42%.       Neck pain    Nephrolithiasis 05/29/2010   CT scan of abdomen/pelvis on 05/29/2010 showed an obstructing approximate 1-2 mm calculus at the left UVJ, and an approximate 1-2 mm left lower pole renal calculus.   Patient had continuing severe pain ,  and an elevation of his serum creatinine to a value of 1.75 on 06/06/2010.  Patient underwent cystoscopy on 06/08/2010 by Dr. Risa Grill, but attempts at retrograde pyelogram and ureteroscopy were unsucc   Numbness 01/08/2018   Obstructive sleep apnea 03/06/2008   Sleep study 03/06/08 showed severe OSA/hypopnea syndrome, with successful CPAP titration to 13 CWP using a medium ResMed Mirage Quattro full face mask with heated humidifier.    Rash 04/17/2014   Renal calculus 05/29/2010   CT scan of abdomen/pelvis on 05/29/2010 showed an obstructing approximate 1-2 mm calculus at the left UVJ, and an approximate 1-2 mm left lower pole renal calculus.   Patient had continuing severe pain , and an elevation of his serum creatinine to a value of 1.75 on 06/06/2010.  The stone had apparently passed and was not seen on repeat CT 06/08/2010.   Sleep apnea    haven't use cpap in 2 years   Tooth pain 10/29/2017   Urinary straining 11/02/2016   Past Surgical History:  Procedure  Laterality Date   big toe nails removed Bilateral 20 yrs ago   CARDIOVERSION N/A 06/08/2017   Procedure: CARDIOVERSION;  Surgeon: Josue Hector, MD;  Location: [place];  Service: Cardiovascular;  Laterality: N/A;   CYSTOSCOPY W/ RETROGRADES     IR RADIOLOGIST EVAL & MGMT  02/02/2018   LYMPHADENECTOMY Bilateral 10/06/2017   Procedure: LYMPHADENECTOMY;  Surgeon: Lucas Mallow, MD;  Location: [facility];  Service: Urology;  Laterality: Bilateral;   ROBOT ASSISTED LAPAROSCOPIC RADICAL PROSTATECTOMY N/A 10/06/2017   Procedure: XI ROBOTIC ASSISTED LAPAROSCOPIC RADICAL PROSTATECTOMY;  Surgeon: Lucas Mallow, MD;  Location: [facility];  Service: Urology;  Laterality: N/A;   TRANSURETHRAL RESECTION OF BLADDER TUMOR N/A 07/13/2017   Procedure: TRANSURETHRAL RESECTION OF PROSTATE;  Surgeon: Lucas Mallow, MD;  Location: [facility];  Service: Urology;  Laterality: N/A;   Social History   Socioeconomic History   Marital status: Single    Spouse name: Not on file   Number of children: 4 b   Years of education: 16   Highest education level: Not on file  Occupational History   Occupation:        Fish farm manager: UNEMPLOYED  Social Designer, fashion/clothing strain: Not on file   Food insecurity    Worry: Not on file    Inability: Not on file   Transportation needs    Medical: Not on file    Non-medical: Not on file  Tobacco Use   Smoking status: Never Smoker   Smokeless tobacco: Never Used  Substance and Sexual Activity   Alcohol use: No    Alcohol/week: 0.0 standard drinks   Drug use: No   Sexual activity: Not Currently  Lifestyle   Physical activity    Days per week: Not on file    Minutes per session: Not on file   Stress: Not on file  Relationships   Social connections    Talks on phone: Not on file    Gets together: Not on file    Attends religious service: Not on file    Active member of club or organization: Not on file    Attends meetings of clubs or  organizations: Not on file    Relationship status: Not on file   Intimate partner violence    Fear of current or ex partner: Not on file    Emotionally abused: Not on file    Physically abused: Not on file  Forced sexual activity: Not on file  Other Topics Concern   Not on file  Social History Narrative   Divorced, 4 children, lives alone.     Family History  Problem Relation Age of Onset   Breast cancer Mother    Hypertension Father    Diabetes Maternal Grandmother    Colon cancer Neg Hx    Prostate cancer Neg Hx    Heart attack Neg Hx    Esophageal cancer Neg Hx    Inflammatory bowel disease Neg Hx    Liver disease Neg Hx    Pancreatic cancer Neg Hx    Rectal cancer Neg Hx    Stomach cancer Neg Hx    I have reviewed his medical, social, and family history in detail and updated the electronic medical record as necessary.    PHYSICAL EXAMINATION  Telehealth visit   REVIEW OF DATA  I reviewed the following data at the time of this encounter:  GI Procedures and Studies  Previously reviewed  Laboratory Studies  Reviewed in epic  Imaging Studies  No new imaging studies to review   ASSESSMENT  Mr. Colleran is a 63 y.o. male with a pmh significant for Afib (on Xarelto), HTN, HLD, OSA, BPH, MDD, Nephrolithiasis, DM, HFrEF, GERD, esophagitis, IDA.  The patient is seen today for evaluation and management of:  1. Iron deficiency   2. Gastroesophageal reflux disease with esophagitis   3. Hx of esophagitis    The patient seems to be doing well after his IV iron infusions.  He no longer has anemia but still has iron deficiency leading to the IV iron need.  At this point, I would like to move forward with the patient being scheduled for weekly laboratories as well as getting him set up for his upper and lower.  He will need a different type of preparation or 2-day preparation to optimize him for his colonoscopy as he was not clean on his last attempt.  The  patient is happy that things are improving and his weight is stable at this point in time on his recent diuretics.  The risks and benefits of endoscopic evaluation were discussed with the patient; these include but are not limited to the risk of perforation, infection, bleeding, missed lesions, lack of diagnosis, severe illness requiring hospitalization, as well as anesthesia and sedation related illnesses.  The patient is agreeable to proceed.  All patient questions were answered, to the best of my ability, and the patient agrees to the aforementioned plan of action with follow-up as indicated.   PLAN  Laboratories as outlined below to be obtained in approximately 3 to 4 weeks (after his last IV iron infusion) Proceed with surveillance endoscopy to check on esophagitis healing Proceed with repeat attempt at screening colonoscopy due to poor preparation Continue oral iron twice daily for now Continue PPI once daily  We will need to get approval for patient to come off Xarelto as he previously did   Orders Placed This Encounter  Procedures   CBC   IBC + Ferritin   Ambulatory referral to Gastroenterology    New Prescriptions   No medications on file   Modified Medications   Modified Medication Previous Medication   OMEPRAZOLE (PRILOSEC) 40 MG CAPSULE omeprazole (PRILOSEC) 40 MG capsule      Take 1 capsule (40 mg total) by mouth daily.    Take 1 capsule (40 mg total) by mouth daily.    Planned Follow Up: No follow-ups on file.  [name], MD [facility] Office # [phone]

## 2018-11-24 NOTE — Patient Instructions (Addendum)
Your provider has requested that you go to the basement level for lab work on 12/11/2018. Press "B" on the elevator. The lab is located at the first door on the left as you exit the elevator.  Continue Omeprazole once daily.   You have been scheduled for an endoscopy and colonoscopy. Please follow the written instructions given to you at your visit today. Please pick up your prep supplies at the pharmacy within the next 1-3 days. If you use inhalers (even only as needed), please bring them with you on the day of your procedure.  Thank you for choosing me and [facility].  Dr. Rush Landmark

## 2018-11-25 ENCOUNTER — Encounter: Payer: Self-pay | Admitting: Gastroenterology

## 2018-11-25 DIAGNOSIS — Z8719 Personal history of other diseases of the digestive system: Secondary | ICD-10-CM | POA: Insufficient documentation

## 2018-11-25 DIAGNOSIS — K2100 Gastro-esophageal reflux disease with esophagitis, without bleeding: Secondary | ICD-10-CM | POA: Insufficient documentation

## 2018-11-27 ENCOUNTER — Telehealth: Payer: Self-pay

## 2018-11-27 MED FILL — PEG-3350 SOLUTION: 420 | 1 days supply | Qty: 4000 | Fill #0

## 2018-11-27 NOTE — Telephone Encounter (Signed)
Pharm please address xarelto thanks 

## 2018-11-27 NOTE — Telephone Encounter (Addendum)
Request for surgical clearance:     Endoscopy Procedure  What type of surgery is being performed?     Endo/Colon    When is this surgery scheduled?     01/11/2019  What type of clearance is required ?   Pharmacy  Are there any medications that need to be held prior to surgery and how long? 2 days- Xarelto   Practice name and name of physician performing surgery?      [facility]; Dr Dawayne Patricia   What is your office phone and fax number?      Phone- [phone]  Fax- [phone] Attn: Helmut Muster   Anesthesia type (None, local, MAC, general) ?       MAC

## 2018-11-27 NOTE — Telephone Encounter (Signed)
Pt takes Xarelto for afib with CHADS2VASc score of 3 (CHF, HTN, DM). SCr 1.64, CrCl using ABW is 15mL/min. Ok to hold Xarelto 2 days prior to procedure as requested.

## 2018-11-28 DIAGNOSIS — R32 Unspecified urinary incontinence: Secondary | ICD-10-CM | POA: Diagnosis not present

## 2018-11-28 NOTE — Telephone Encounter (Signed)
Pt informed okay to hold Xarelto 2 days prior to procedure on 01/11/2019. Pt voiced understanding.

## 2018-11-28 NOTE — Telephone Encounter (Signed)
Thank you for update. GM 

## 2018-12-11 ENCOUNTER — Other Ambulatory Visit (INDEPENDENT_AMBULATORY_CARE_PROVIDER_SITE_OTHER): Payer: Medicaid Other

## 2018-12-11 DIAGNOSIS — Z8719 Personal history of other diseases of the digestive system: Secondary | ICD-10-CM

## 2018-12-11 DIAGNOSIS — E611 Iron deficiency: Secondary | ICD-10-CM

## 2018-12-11 LAB — IBC + FERRITIN
Ferritin: 323.4 ng/mL — ABNORMAL HIGH (ref 22.0–322.0)
Iron: 125 ug/dL (ref 42–165)
Saturation Ratios: 41.1 % (ref 20.0–50.0)
Transferrin: 217 mg/dL (ref 212.0–360.0)

## 2018-12-11 LAB — CBC
HCT: 43.4 % (ref 39.0–52.0)
Hemoglobin: 14.1 g/dL (ref 13.0–17.0)
MCHC: 32.5 g/dL (ref 30.0–36.0)
MCV: 85.4 fl (ref 78.0–100.0)
Platelets: 150 10*3/uL (ref 150.0–400.0)
RBC: 5.08 Mil/uL (ref 4.22–5.81)
RDW: 15.1 % (ref 11.5–15.5)
WBC: 5.5 10*3/uL (ref 4.0–10.5)

## 2018-12-19 ENCOUNTER — Ambulatory Visit: Payer: Medicaid Other | Admitting: Dietician

## 2018-12-19 ENCOUNTER — Ambulatory Visit (INDEPENDENT_AMBULATORY_CARE_PROVIDER_SITE_OTHER): Payer: Medicaid Other | Admitting: Dietician

## 2018-12-19 DIAGNOSIS — Z713 Dietary counseling and surveillance: Secondary | ICD-10-CM

## 2018-12-19 DIAGNOSIS — E1142 Type 2 diabetes mellitus with diabetic polyneuropathy: Secondary | ICD-10-CM

## 2018-12-19 NOTE — Progress Notes (Signed)
Diabetes Self-Management Education  Visit Type:  Follow-up   Diabetes Self-Management Education  Visit Type: Follow-up  Appt. Start Time: 1525 Appt. End Time: 3536  12/19/2018  [name], identified by name and date of birth, is a 63 y.o. male with a diagnosis of Diabetes:  .   ASSESSMENT He says his dentist has not opened.  Teeth are falling out, not hurting, this is a priority for him. He called some others that would not work for him. I called Dr. Andrey Cota practice and made him an appointment for Sept 1 at 230, be there 215 to complete pm no guarantee of any same day treatments.  Sleeping continues to be better,thinks it is because he is more active, not wearing cpap.  Feet still a little swollen; they are tight and they hurt.has not done exercises   Self Monitoring: Blood sugar: everyday first thing in morning.120 or less, 113 or 103, 1 over 150;  weight 299# was 305#  Medicine:   50 once a day Xultophy- he stopped his metformin but says not much difference on blood sugar. He is concerned about it worsening his neuropathy. Last B12 checked 04/2018 was low normal. He takes B12 tablets, but unsure of dose  Meal planning: has decreased sugar sweetened beverages and bread, uses some salt in cooking; he says he is doing better with those goals.  Eats at noon and takes meds: today was leftovers from [employer]-  baked chicken leg and fried chicken leg, pintos, one hush puppy; rice    3-4 pm- salad with cheese, ham, bacon bits, blue cheese & ranch or cabbage or pizza or cereal  Fish- "has not gotten that far" Beverages- water, lemonade, little bit of soda, 2% milk Vegetables- 4/7 times/week Fruit- 4/7- orange, apples, smoothies  Physical activity: walking around the building, cleaning and mopping  He needs ophthalmology referral, Recommend podiatry referrals in the future  There were no vitals taken for this visit. There is no height or weight on file to calculate  BMI.  Diabetes Self-Management Education - 12/19/18 1600      Visit Information   Visit Type  Follow-up      Individualized Goals (developed by patient)   Reducing Risk  do foot checks daily   call dentist- he made more calls     Patient Self-Evaluation of Goals - Patient rates self as meeting previously set goals (% of time)   Reducing Risk  50 - 75 %      Outcomes   Expected Outcomes  Demonstrated interest in learning. Expect positive outcomes    Future DMSE  4-6 wks    Program Status  Not Completed      Subsequent Visit   Since your last visit have you continued or begun to take your medications as prescribed?  No   has continued off metformin   Since your last visit have you experienced any weight changes?  Loss    Weight Loss (lbs)  5    Since your last visit, are you checking your blood glucose at least once a day?  Yes       Individualized Plan for Diabetes Self-Management Training:   Learning Objective:  Patient will have a greater understanding of diabetes self-management. Patient education plan is to attend individual and/or group sessions per assessed needs and concerns.   Plan:   Patient Instructions  Cereals with more fiber: Multigrain Cheerios Raisin bran Oatmeal squares  Drink More water, sugar free lemonade,  maybe try less sugar in drinks or add water to them?   To help decreased salt intake- try lemon or vinegar on veggies and foods.  Can use all the pepper you want- spice it up with garlic, onions  Restart Metformin Please bring your vitamin B12 tablets with you to yor appointment in September with Dr. Vangie Bicker have an appointment at Dr. Andrey Cota practice in [place]. You will see Dr. Eulas Post. Sept 1 at 230, be there 215 to complete papers. No guarantee of any same day treatments.   Dr. Madaline Guthrie Office Address: [place], [place] [address]  Phone: [phone]  [name] [phone]   Expected Outcomes:    imrpoved blood sugars Education material provided: No sodium seasonings If problems or questions, patient to contact team via:  Phone Future DSME appointment: -   1 month scheduled Debera Lat, RD 12/19/2018 4:32 PM.

## 2018-12-19 NOTE — Patient Instructions (Addendum)
Hi Terry Parks,    Thank you for speaking with me on telephone today. Here is the information we discussed.  Cereals with more fiber: Multigrain Cheerios Raisin bran Oatmeal squares  Drink More water, sugar free lemonade, maybe try less sugar in drinks or add water to them?   To help decreased salt intake- try lemon or vinegar on veggies and foods.  Can use all the pepper you want- spice it up with garlic, onions  Restart Metformin Please bring your vitamin B12 tablets with you to your appointment in September with Dr. Vangie Bicker have an appointment at Dr. Andrey Cota practice in [place]. You will see Dr. Eulas Post. Sept 1 at 230, be there 215 to complete papers. No guarantee of any same day treatments.   Dr. Madaline Guthrie Office Address: [place], [place] [address]  Phone: [place] [phone]

## 2018-12-26 ENCOUNTER — Ambulatory Visit (INDEPENDENT_AMBULATORY_CARE_PROVIDER_SITE_OTHER): Payer: Medicaid Other | Admitting: Cardiovascular Disease

## 2018-12-26 ENCOUNTER — Encounter: Payer: Self-pay | Admitting: Cardiovascular Disease

## 2018-12-26 ENCOUNTER — Other Ambulatory Visit: Payer: Self-pay

## 2018-12-26 VITALS — BP 168/98 | HR 86 | Temp 98.2°F | Ht 73.0 in | Wt 321.8 lb

## 2018-12-26 DIAGNOSIS — E1169 Type 2 diabetes mellitus with other specified complication: Secondary | ICD-10-CM | POA: Diagnosis not present

## 2018-12-26 DIAGNOSIS — I428 Other cardiomyopathies: Secondary | ICD-10-CM | POA: Diagnosis not present

## 2018-12-26 DIAGNOSIS — I4819 Other persistent atrial fibrillation: Secondary | ICD-10-CM

## 2018-12-26 DIAGNOSIS — Z79899 Other long term (current) drug therapy: Secondary | ICD-10-CM | POA: Diagnosis not present

## 2018-12-26 DIAGNOSIS — I5042 Chronic combined systolic (congestive) and diastolic (congestive) heart failure: Secondary | ICD-10-CM

## 2018-12-26 DIAGNOSIS — I10 Essential (primary) hypertension: Secondary | ICD-10-CM | POA: Diagnosis not present

## 2018-12-26 DIAGNOSIS — Z01818 Encounter for other preprocedural examination: Secondary | ICD-10-CM | POA: Diagnosis not present

## 2018-12-26 DIAGNOSIS — Z7901 Long term (current) use of anticoagulants: Secondary | ICD-10-CM | POA: Diagnosis not present

## 2018-12-26 DIAGNOSIS — Z5181 Encounter for therapeutic drug level monitoring: Secondary | ICD-10-CM

## 2018-12-26 DIAGNOSIS — E785 Hyperlipidemia, unspecified: Secondary | ICD-10-CM | POA: Diagnosis not present

## 2018-12-26 DIAGNOSIS — G4733 Obstructive sleep apnea (adult) (pediatric): Secondary | ICD-10-CM | POA: Diagnosis not present

## 2018-12-26 DIAGNOSIS — E669 Obesity, unspecified: Secondary | ICD-10-CM

## 2018-12-26 LAB — COMPREHENSIVE METABOLIC PANEL
ALT: 32 IU/L (ref 0–44)
AST: 24 IU/L (ref 0–40)
Albumin/Globulin Ratio: 1.3 (ref 1.2–2.2)
Albumin: 4 g/dL (ref 3.8–4.8)
Alkaline Phosphatase: 93 IU/L (ref 39–117)
BUN/Creatinine Ratio: 6 — ABNORMAL LOW (ref 10–24)
BUN: 10 mg/dL (ref 8–27)
Bilirubin Total: 0.2 mg/dL (ref 0.0–1.2)
CO2: 26 mmol/L (ref 20–29)
Calcium: 8.6 mg/dL (ref 8.6–10.2)
Chloride: 100 mmol/L (ref 96–106)
Creatinine, Ser: 1.67 mg/dL — ABNORMAL HIGH (ref 0.76–1.27)
GFR calc Af Amer: 50 mL/min/{1.73_m2} — ABNORMAL LOW (ref >59)
GFR calc non Af Amer: 44 mL/min/{1.73_m2} — ABNORMAL LOW (ref >59)
Globulin, Total: 3 g/dL (ref 1.5–4.5)
Glucose: 144 mg/dL — ABNORMAL HIGH (ref 65–99)
Potassium: 3.5 mmol/L (ref 3.5–5.2)
Sodium: 142 mmol/L (ref 134–144)
Total Protein: 7 g/dL (ref 6.0–8.5)

## 2018-12-26 LAB — TSH: TSH: 2.41 u[IU]/mL (ref 0.450–4.500)

## 2018-12-26 MED ORDER — SPIRONOLACTONE 50 MG PO TABS: 50.0000 mg | ORAL_TABLET | Freq: Every day | ORAL | 1 refills | Status: DC

## 2018-12-26 MED ORDER — CARVEDILOL 25 MG PO TABS: 25.0000 mg | ORAL_TABLET | Freq: Two times a day (BID) | ORAL | 1 refills | Status: DC

## 2018-12-26 MED FILL — CARVEDILOL 25 MG TABLET: 25 | 90 days supply | Qty: 180 | Fill #0

## 2018-12-26 MED FILL — SPIRONOLACTONE 50 MG TABLET: 50 | 90 days supply | Qty: 90 | Fill #0

## 2018-12-26 NOTE — Progress Notes (Signed)
Patient ID: Terry Parks, male   DOB: 1955/12/15, 63 y.o.   MRN: [id number]    Cardiology Office Note    Date:  12/26/2018   ID:  [name], DOB [birth year], MRN [id number]  PCP:  [name], MD  Cardiologist:   [name], MD   Chief Complaint  Patient presents with  . Congestive Heart Failure    History of Present Illness:  Terry Parks is a 63 y.o. male with  combined systolic and diastolic heart failure (LVEF 30-35% by most recent echo in January 2019) paroxysmal atrial fibrillation. He has never had catheterization and has not had angina pectoris, but a nuclear stress test in 2013 showed a fixed anterior defect.  He has systemic hypertension, severe obesity, obstructive sleep apnea on CPAP, and diabetes mellitus.   He was hospitalized with acute heart failure exacerbation in the setting of acute kidney injury secondary to nephrolithiasis with ureteral obstruction in January 2019, but since then has done well, without repeat hospitalization or need for diuretic dose adjustment, eben though he has had a variety of other health problems, some requiring admission.  05/2017 he was also diagnosed with atrial fibrillation.  He underwent cardioversion and then had atrial flutter.  He was started on amiodarone and Xarelto.  He has not had any serious bleeding problems or neurological events.  The patient specifically denies any chest pain at rest exertion, dyspnea at rest or with exertion, orthopnea, paroxysmal nocturnal dyspnea, syncope, palpitations, focal neurological deficits, intermittent claudication, lower extremity edema, unexplained weight gain, cough, hemoptysis or wheezing.  BP remains high after we added spironolactone. Often has had low or borderline low K levels. Creatinine stable around 1.6. DM poorly controlled (HgbA1c 10.3 in May). Scheduled for colonoscopy in 2 weeks.   Past Medical History:  Diagnosis Date  . Abscessed tooth    top back large cavity  no pain or drainage, one on bottom  pt pulled tooth 4-5 months ago, right top large hole in tooth  . AKI (acute kidney injury) ([place])   . Allergic rhinitis   . Anemia   . Anxiety   . Asthma   . Atrial fibrillation ([place])   . BPH (benign prostatic hypertrophy)    Massive BPH noted on cystoscopy 1/23/ 2012 by Dr. Risa Grill.  . Cancer [facility])    prostate cancer 2019  . Cardiomyopathy ([place])   . CHF (congestive heart failure) ([place])   . Cough 03/30/2012  . Depression   . Diabetes mellitus 04/08/2008   type 2  . Foley catheter in place 07-05-17 placed  . Headache(784.0)    hx migraines none recent  . Hyperlipemia   . Hypertension   . Hypertensive cardiopathy 03/01/2006   2-D echocardiogram 02/01/2012 showed moderate LVH, mildly to moderately reduced left ventricular systolic function with an estimated ejection fraction of 40-45%, and diffuse hypokinesis.  A nuclear medicine stress study done 01/31/2012 showed no reversible ischemia, a small mid anterior wall fixed defect/infarct, and ejection fraction 42%.      . Neck pain   . Nephrolithiasis 05/29/2010   CT scan of abdomen/pelvis on 05/29/2010 showed an obstructing approximate 1-2 mm calculus at the left UVJ, and an approximate 1-2 mm left lower pole renal calculus.   Patient had continuing severe pain , and an elevation of his serum creatinine to a value of 1.75 on 06/06/2010.  Patient underwent cystoscopy on 06/08/2010 by Dr. Risa Grill, but attempts at retrograde pyelogram and ureteroscopy were unsucc  . Numbness 01/08/2018  . Obstructive  sleep apnea 03/06/2008   Sleep study 03/06/08 showed severe OSA/hypopnea syndrome, with successful CPAP titration to 13 CWP using a medium ResMed Mirage Quattro full face mask with heated humidifier.   . Rash 04/17/2014  . Renal calculus 05/29/2010   CT scan of abdomen/pelvis on 05/29/2010 showed an obstructing approximate 1-2 mm calculus at the left UVJ, and an approximate 1-2 mm left lower pole renal calculus.   Patient  had continuing severe pain , and an elevation of his serum creatinine to a value of 1.75 on 06/06/2010.  The stone had apparently passed and was not seen on repeat CT 06/08/2010.  . Sleep apnea    haven't use cpap in 2 years  . Tooth pain 10/29/2017  . Urinary straining 11/02/2016    Past Surgical History:  Procedure Laterality Date  . big toe nails removed Bilateral 20 yrs ago  . CARDIOVERSION N/A 06/08/2017   Procedure: CARDIOVERSION;  Surgeon: Josue Hector, MD;  Location: [facility] ENDOSCOPY;  Service: Cardiovascular;  Laterality: N/A;  . CYSTOSCOPY W/ RETROGRADES    . IR RADIOLOGIST EVAL & MGMT  02/02/2018  . LYMPHADENECTOMY Bilateral 10/06/2017   Procedure: LYMPHADENECTOMY;  Surgeon: Lucas Mallow, MD;  Location: [facility] ORS;  Service: Urology;  Laterality: Bilateral;  . ROBOT ASSISTED LAPAROSCOPIC RADICAL PROSTATECTOMY N/A 10/06/2017   Procedure: XI ROBOTIC ASSISTED LAPAROSCOPIC RADICAL PROSTATECTOMY;  Surgeon: Lucas Mallow, MD;  Location: [facility] ORS;  Service: Urology;  Laterality: N/A;  . TRANSURETHRAL RESECTION OF BLADDER TUMOR N/A 07/13/2017   Procedure: TRANSURETHRAL RESECTION OF PROSTATE;  Surgeon: Lucas Mallow, MD;  Location: [facility] ORS;  Service: Urology;  Laterality: N/A;    Outpatient Medications Prior to Visit  Medication Sig Dispense Refill  . Accu-Chek Softclix Lancets lancets Check blood sugar three times a day as instructed 300 each 3  . acetaminophen (TYLENOL) 325 MG tablet Take 325-650 mg by mouth every 6 (six) hours as needed (for pain).    [name] amiodarone (PACERONE) 200 MG tablet Take 1 tablet (200 mg total) by mouth daily. 90 tablet 1  . amLODipine-atorvastatin (CADUET) 5-40 MG tablet Take 1 tablet by mouth daily.    . Blood Glucose Monitoring Suppl (ACCU-CHEK AVIVA) device Use as instructed 1 each 0  . cholecalciferol (VITAMIN D3) 25 MCG (1000 UT) tablet Take 1 tablet (1,000 Units total) by mouth daily. 30 tablet 11  . DULoxetine (CYMBALTA) 60 MG capsule Take 1 capsule  (60 mg total) by mouth daily. 90 capsule 3  . ferrous sulfate 325 (65 FE) MG EC tablet Take 1 tablet (325 mg total) by mouth daily. (Patient taking differently: Take 325 mg by mouth daily. ) 90 tablet 0  . gabapentin (NEURONTIN) 300 MG capsule Take 1 capsule (300 mg total) by mouth 2 (two) times daily. 60 capsule 3  . 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 insulin dependent 300 each 11  . hydrocortisone cream 0.5 % Apply 1 application topically 2 (two) times daily. 30 g 0  . Insulin Degludec-Liraglutide (XULTOPHY) 100-3.6 UNIT-MG/ML SOPN Inject 50 Units into the skin daily. 5 pen 11  . Insulin Pen Needle (CARETOUCH PEN NEEDLES) 31G X 6 MM MISC 1 pen by Does not apply route at bedtime. 100 each 3  . isosorbide mononitrate (IMDUR) 30 MG 24 hr tablet Take 1 tablet (30 mg total) by mouth daily. 90 tablet 3  . losartan (COZAAR) 100 MG tablet Take 1 tablet (100  mg total) by mouth daily. 90 tablet 0  . metformin (FORTAMET) 1000 MG (OSM) 24 hr tablet Take 2 tablets (2,000 mg total) by mouth daily with breakfast. 180 tablet 1  . Misc. Devices MISC by Does not apply route. C-PAP    . nystatin cream (MYCOSTATIN) Apply 1 application topically See admin instructions. Apply to arms and legs 2 times a day 30 g 1  . omeprazole (PRILOSEC) 40 MG capsule Take 1 capsule (40 mg total) by mouth daily. 30 capsule 6  . polyethylene glycol (MIRALAX / GLYCOLAX) packet Take 17 g by mouth daily. 14 each 0  . rivaroxaban (XARELTO) 20 MG TABS tablet Take 1 tablet (20 mg total) by mouth daily with breakfast. 90 tablet 0  . torsemide (DEMADEX) 20 MG tablet Take 2 tablets (40 mg total) by mouth 2 (two) times daily. 120 tablet 2  . spironolactone (ALDACTONE) 25 MG tablet Take 1 tablet (25 mg total) by mouth daily. 30 tablet 2  . carvedilol (COREG) 12.5 MG tablet Take 1 tablet (12.5 mg total) by mouth 2 (two) times daily with a meal. 180 tablet 3   No  facility-administered medications prior to visit.      Allergies:   Lisinopril   Social History   Socioeconomic History  . Marital status: Single    Spouse name: Not on file  . Number of children: 4 b  . Years of education: 83  . Highest education level: Not on file  Occupational History  . Occupation:        Employer: UNEMPLOYED  Social Needs  . Financial resource strain: Not on file  . Food insecurity    Worry: Not on file    Inability: Not on file  . Transportation needs    Medical: Not on file    Non-medical: Not on file  Tobacco Use  . Smoking status: Never Smoker  . Smokeless tobacco: Never Used  Substance and Sexual Activity  . Alcohol use: No    Alcohol/week: 0.0 standard drinks  . Drug use: No  . Sexual activity: Not Currently  Lifestyle  . Physical activity    Days per week: Not on file    Minutes per session: Not on file  . Stress: Not on file  Relationships  . Social Herbalist on phone: Not on file    Gets together: Not on file    Attends religious service: Not on file    Active member of club or organization: Not on file    Attends meetings of clubs or organizations: Not on file    Relationship status: Not on file  Other Topics Concern  . Not on file  Social History Narrative   Divorced, 4 children, lives alone.       Family History:  The patient's family history includes Breast cancer in his mother; Diabetes in his maternal grandmother; Hypertension in his father.   ROS:   Please see the history of present illness.    Al other systems are reviewed and are negative.  PHYSICAL EXAM:   VS:  BP (!) 168/98   Pulse 86   Temp 98.2 F (36.8 C)   Ht 6\' 1"  (1.854 m)   Wt (!) 321 lb 12.8 oz (146 kg)   SpO2 98%   BMI 42.46 kg/m      General: Alert, oriented x3, no distress, morbidly obese Head: no evidence of trauma, PERRL, EOMI, no exophtalmos or lid lag, no myxedema, no xanthelasma; normal  ears, nose and oropharynx Neck: normal  jugular venous pulsations and no hepatojugular reflux; brisk carotid pulses without delay and no carotid bruits Chest: clear to auscultation, no signs of consolidation by percussion or palpation, normal fremitus, symmetrical and full respiratory excursions Cardiovascular: normal position and quality of the apical impulse, regular rhythm, normal first and second heart sounds, no murmurs, rubs or gallops Abdomen: no tenderness or distention, no masses by palpation, no abnormal pulsatility or arterial bruits, normal bowel sounds, no hepatosplenomegaly Extremities: no clubbing, cyanosis or edema; 2+ radial, ulnar and brachial pulses bilaterally; 2+ right femoral, posterior tibial and dorsalis pedis pulses; 2+ left femoral, posterior tibial and dorsalis pedis pulses; no subclavian or femoral bruits Neurological: grossly nonfocal Psych: Normal mood and affect    Wt Readings from Last 3 Encounters:  12/26/18 (!) 321 lb 12.8 oz (146 kg)  11/13/18 (!) 311 lb (141.1 kg)  11/03/18 299 lb (135.6 kg)      Studies/Labs Reviewed:   EKG:  EKG is ordered today.  It shows NSR, left axis deviation, QTc prolonged at 524 ms Recent Labs: 02/08/2018: B Natriuretic Peptide 761.4 11/10/2018: Magnesium 1.9 12/11/2018: Hemoglobin 14.1; Platelets 150.0 12/26/2018: ALT 32; BUN 10; Creatinine, Ser 1.67; Potassium 3.5; Sodium 142; TSH 2.410   Lipid Panel    Component Value Date/Time   CHOL 101 01/09/2018 0509   CHOL 130 08/22/2017 1151   TRIG 57 01/09/2018 0509   HDL 26 (L) 01/09/2018 0509   HDL 39 (L) 08/22/2017 1151   CHOLHDL 3.9 01/09/2018 0509   VLDL 11 01/09/2018 0509   LDLCALC 64 01/09/2018 0509   LDLCALC 75 08/22/2017 1151     ASSESSMENT:    1. Chronic combined systolic and diastolic heart failure (HCC)   2. Persistent atrial fibrillation   3. Long term (current) use of anticoagulants   4. Encounter for monitoring amiodarone therapy   5. Essential hypertension   6. Nonischemic cardiomyopathy  ([place])   7. OSA (obstructive sleep apnea)   8. Diabetes mellitus type 2 in obese ([place])   9. Morbid obesity ([place])   10. Dyslipidemia   11. Preop examination      PLAN:  In order of problems listed above:  1. CHF: Appears compensated, euvolemic, NYHA 1-2 (but pretty sedentary).  He has gained a lot of weight, definitely not just fluid. Not sure of "dry weight" anymore.  He is on ARB and carvedilol.  On a fairly low dose of loop diuretic.  Reminded him about the importance of regular weight monitoring and sodium restriction in his diet. 2. AFib: Maintaining sinus rhythm on amiodarone.  CHADSVasc 3-4 (HTN, CHF, DM, +/- CAD) on anticoagulation. 3. Amiodarone monitoring: Check LFTs and TSH now and every 6 months.   4. Anticoagulation is well tolerated, willl hold temporarily for colonoscopy (48 hours) 5. HTN: poor control, not in little part due to increased weight. Avoid sodium in diet, exercise more, increase spironolactone to 50 mg daily. Increase carvedilol to 25 mg BID. 6. CMP: the cause of his cardiomyopathy is not entirely clear, but his ejection fraction has always been verys dependent on the adequacy of blood pressure control.  Whenever his blood pressure is well controlled, the estimation of his ejection fraction is always much better. 7. OSA: Recommend 100% compliance with CPAP.He reports wearing it most of the time. 8. DM: No longer on Jardiance (and DM control has deteriorated and BP is higher).  Not sure why stopped (cost?), from a CV standpoint would be great to  get back on SGLT2 inhibitor. Retinopathy marks risk for progression of renal diabetic disease 9. Obesity: back in morbidly obese range - this is a big setback for long term prognosis. 10. HLP: Last labs 1 year ago at target LDL less than 70, on high-dose statin therapy.  Very low HDL due to obesity/sedentarism. 11. Preop exam: OK to hold anticoagulant for 2 days before the colonoscopy.   Medication Adjustments/Labs and Tests  Ordered: Current medicines are reviewed at length with the patient today.  Concerns regarding medicines are outlined above.  Medication changes, Labs and Tests ordered today are listed in the Patient Instructions below. Patient Instructions  Medication Instructions:  INCREASE the Carvedilol to 25 mg twice daily INCREASE the Spironolactone to 50 once daily  If you need a refill on your cardiac medications before your next appointment, please call your pharmacy.   Lab work: Your provider would like for you to have the following labs today: TSH and CMET  If you have labs (blood work) drawn today and your tests are completely normal, you will receive your results only by: [name] MyChart Message (if you have MyChart) OR . A paper copy in the mail If you have any lab test that is abnormal or we need to change your treatment, we will call you to review the results.  Testing/Procedures: None ordered  Follow-Up: At [facility], you and your health needs are our priority.  As part of our continuing mission to provide you with exceptional heart care, we have created designated Provider Care Teams.  These Care Teams include your primary Cardiologist (physician) and Advanced Practice Providers (APPs -  Physician Assistants and Nurse Practitioners) who all work together to provide you with the care you need, when you need it. You will need a follow up appointment in 3 months.  Please call our office 2 months in advance to schedule this appointment.  You may see [name], MD or one of the following Advanced Practice Providers on your designated Care Team: [place], [place] . Fabian Sharp, PA-C    Signed, [name], MD  12/26/2018 10:31 PM    [address], [place], [place]  [address] Phone: [phone]; Fax: [phone]

## 2018-12-26 NOTE — Patient Instructions (Signed)
Medication Instructions:  INCREASE the Carvedilol to 25 mg twice daily INCREASE the Spironolactone to 50 once daily  If you need a refill on your cardiac medications before your next appointment, please call your pharmacy.   Lab work: Your provider would like for you to have the following labs today: TSH and CMET  If you have labs (blood work) drawn today and your tests are completely normal, you will receive your results only by: [name] MyChart Message (if you have MyChart) OR . A paper copy in the mail If you have any lab test that is abnormal or we need to change your treatment, we will call you to review the results.  Testing/Procedures: None ordered  Follow-Up: At [facility], you and your health needs are our priority.  As part of our continuing mission to provide you with exceptional heart care, we have created designated Provider Care Teams.  These Care Teams include your primary Cardiologist (physician) and Advanced Practice Providers (APPs -  Physician Assistants and Nurse Practitioners) who all work together to provide you with the care you need, when you need it. You will need a follow up appointment in 3 months.  Please call our office 2 months in advance to schedule this appointment.  You may see [name], MD or one of the following Advanced Practice Providers on your designated Care Team: [place], [place] . Fabian Sharp, PA-C

## 2018-12-27 ENCOUNTER — Other Ambulatory Visit: Payer: Self-pay | Admitting: *Deleted

## 2018-12-27 DIAGNOSIS — I10 Essential (primary) hypertension: Secondary | ICD-10-CM

## 2018-12-27 DIAGNOSIS — Z79899 Other long term (current) drug therapy: Secondary | ICD-10-CM

## 2018-12-28 ENCOUNTER — Ambulatory Visit: Payer: Medicaid Other | Admitting: Cardiovascular Disease

## 2019-01-08 MED FILL — LOSARTAN POTASSIUM 50 MG TA: 50 | 30 days supply | Qty: 30 | Fill #2

## 2019-01-08 MED FILL — XULTOPHY 100 UNIT-3.6MG/ML: 100-3.6 | 30 days supply | Qty: 15 | Fill #1

## 2019-01-08 MED FILL — OMEPRAZOLE DR 40 MG CAPSULE: 40 | 30 days supply | Qty: 30 | Fill #1

## 2019-01-10 ENCOUNTER — Telehealth: Payer: Self-pay | Admitting: *Deleted

## 2019-01-10 NOTE — Telephone Encounter (Signed)
Covid-19 screening questions   Do you now or have you had a fever in the last 14 days? no  Do you have any respiratory symptoms of shortness of breath or cough now or in the last 14 days? no  Do you have any family members or close contacts with diagnosed or suspected Covid-19 in the past 14 days? no  Have you been tested for Covid-19 and found to be positive? Pt [facility] because it was [facility] but he was not sick or having symptoms.  He was told he would be notified if positive and never heard anything.

## 2019-01-11 ENCOUNTER — Encounter: Payer: Self-pay | Admitting: Gastroenterology

## 2019-01-11 ENCOUNTER — Other Ambulatory Visit: Payer: Self-pay

## 2019-01-11 ENCOUNTER — Ambulatory Visit (AMBULATORY_SURGERY_CENTER): Payer: Medicaid Other | Admitting: Gastroenterology

## 2019-01-11 ENCOUNTER — Encounter: Payer: Self-pay | Admitting: *Deleted

## 2019-01-11 VITALS — BP 151/91 | HR 73 | Temp 97.8°F | Resp 14 | Ht 73.0 in | Wt 321.0 lb

## 2019-01-11 DIAGNOSIS — K208 Other esophagitis: Secondary | ICD-10-CM | POA: Diagnosis not present

## 2019-01-11 DIAGNOSIS — E611 Iron deficiency: Secondary | ICD-10-CM

## 2019-01-11 DIAGNOSIS — K219 Gastro-esophageal reflux disease without esophagitis: Secondary | ICD-10-CM

## 2019-01-11 DIAGNOSIS — K2970 Gastritis, unspecified, without bleeding: Secondary | ICD-10-CM | POA: Diagnosis not present

## 2019-01-11 DIAGNOSIS — K259 Gastric ulcer, unspecified as acute or chronic, without hemorrhage or perforation: Secondary | ICD-10-CM | POA: Diagnosis not present

## 2019-01-11 DIAGNOSIS — K2950 Unspecified chronic gastritis without bleeding: Secondary | ICD-10-CM | POA: Diagnosis not present

## 2019-01-11 DIAGNOSIS — K3189 Other diseases of stomach and duodenum: Secondary | ICD-10-CM | POA: Diagnosis not present

## 2019-01-11 DIAGNOSIS — Z8719 Personal history of other diseases of the digestive system: Secondary | ICD-10-CM

## 2019-01-11 DIAGNOSIS — Z1211 Encounter for screening for malignant neoplasm of colon: Secondary | ICD-10-CM | POA: Diagnosis not present

## 2019-01-11 MED ORDER — SODIUM CHLORIDE 0.9 % IV SOLN: 500.0000 mL | Freq: Once | INTRAVENOUS | Status: DC

## 2019-01-11 MED ORDER — OMEPRAZOLE 40 MG PO CPDR: 40.0000 mg | DELAYED_RELEASE_CAPSULE | Freq: Two times a day (BID) | ORAL | 3 refills | Status: DC

## 2019-01-11 MED FILL — OMEPRAZOLE DR 40 MG CAPSULE: 40 | 30 days supply | Qty: 60 | Fill #0

## 2019-01-11 NOTE — Progress Notes (Signed)
A and O x3. Report to RN. Tolerated MAC anesthesia well.Teeth unchanged after procedure.

## 2019-01-11 NOTE — Telephone Encounter (Signed)
Opened window in error. 

## 2019-01-11 NOTE — Progress Notes (Signed)
Temperature- Terry Parks VS-Stephanie Poindexter 

## 2019-01-11 NOTE — Op Note (Signed)
[facility] Patient Name: Terry Parks Procedure Date: 01/11/2019 10:26 AM MRN: [id number] Endoscopist: [name] , MD Age: 63 Referring MD:  Date of Birth: 12/25/1955 Gender: Male Account #: [id number] Procedure:                Colonoscopy Indications:              Incidental - Unexplained iron deficiency anemia, ,                            Screening for colorectal malignant neoplasm                            (previous poor preparation this year) Medicines:                Monitored Anesthesia Care Procedure:                Pre-Anesthesia Assessment:                           - Prior to the procedure, a History and Physical                            was performed, and patient medications and                            allergies were reviewed. The patient's tolerance of                            previous anesthesia was also reviewed. The risks                            and benefits of the procedure and the sedation                            options and risks were discussed with the patient.                            All questions were answered, and informed consent                            was obtained. Prior Anticoagulants: The patient has                            taken Coumadin (warfarin), last dose was 3 days                            prior to procedure. ASA Grade Assessment: III - A                            patient with severe systemic disease. After                            reviewing the risks and benefits, the patient was  deemed in satisfactory condition to undergo the                            procedure.                           After obtaining informed consent, the colonoscope                            was passed under direct vision. Throughout the                            procedure, the patient's blood pressure, pulse, and                            oxygen saturations were monitored continuously. The                        Colonoscope was introduced through the anus and                            advanced to the the cecum, identified by                            appendiceal orifice and ileocecal valve. The                            colonoscopy was performed without difficulty. The                            patient tolerated the procedure. The quality of the                            bowel preparation was adequate. The ileocecal                            valve, appendiceal orifice, and rectum were                            photographed. Scope In: 10:54:03 AM Scope Out: 11:17:12 AM Scope Withdrawal Time: 0 hours 17 minutes 53 seconds  Total Procedure Duration: 0 hours 23 minutes 9 seconds  Findings:                 The digital rectal exam findings include                            hemorrhoids. Pertinent negatives include no                            palpable rectal lesions.                           A large amount of semi-liquid stool was found in  the entire colon, interfering with visualization.                            Lavage of the area was performed using copious                            amounts, resulting in clearance with adequate                            visualization.                           Normal mucosa was found in the entire colon.                           Non-bleeding non-thrombosed internal hemorrhoids                            were found during retroflexion, during perianal                            exam and during digital exam. The hemorrhoids were                            Grade II (internal hemorrhoids that prolapse but                            reduce spontaneously). Complications:            No immediate complications. Estimated Blood Loss:     Estimated blood loss: none. Impression:               - Hemorrhoids found on digital rectal exam.                           - Stool in the entire examined colon. Lavage                             allowed adequate visualization.                           - Normal mucosa in the entire examined colon.                           - Non-bleeding non-thrombosed internal hemorrhoids. Recommendation:           - The patient will be observed post-procedure,                            until all discharge criteria are met.                           - Discharge patient to home.                           - Patient has a contact number available for  emergencies. The signs and symptoms of potential                            delayed complications were discussed with the                            patient. Return to normal activities tomorrow.                            Written discharge instructions were provided to the                            patient.                           - High fiber diet.                           - Use FiberCon 1 tablet PO daily.                           - Continue taking twice daily Oral Iron.                           - Will plan to recheck your CBC/Iron/TIBC/Ferritin                            in 2 more months (3 months since last check) - my                            office will work on scheduling.                           - Further workup/managment of Iron deficiency to                            likely involve Enterscopy + VCE if this were to                            occur in future.                           - Post-Procedure Resumption of Anticoagulants:                            Restart warfarin in 3 days to decrease risk of                            post-biopsy bleeding.                           - Repeat colonoscopy in 10 years for screening                            purposes.                           -  The findings and recommendations were discussed                            with the patient. [name], MD 01/11/2019 11:38:25 AM

## 2019-01-11 NOTE — Op Note (Signed)
[facility] Patient Name: Terry Parks Procedure Date: 01/11/2019 10:26 AM MRN: [id number] Endoscopist: [name] , MD Age: 63 Referring MD:  Date of Birth: 1955-07-18 Gender: Male Account #: [id number] Procedure:                Upper GI endoscopy Indications:              Iron deficiency anemia, Esophagitis, Follow-up of                            esophagitis Medicines:                Monitored Anesthesia Care Procedure:                Pre-Anesthesia Assessment:                           - Prior to the procedure, a History and Physical                            was performed, and patient medications and                            allergies were reviewed. The patient's tolerance of                            previous anesthesia was also reviewed. The risks                            and benefits of the procedure and the sedation                            options and risks were discussed with the patient.                            All questions were answered, and informed consent                            was obtained. Prior Anticoagulants: The patient has                            taken Coumadin (warfarin), last dose was 3 days                            prior to procedure. ASA Grade Assessment: III - A                            patient with severe systemic disease. After                            reviewing the risks and benefits, the patient was                            deemed in satisfactory condition to undergo the  procedure.                           After obtaining informed consent, the endoscope was                            passed under direct vision. Throughout the                            procedure, the patient's blood pressure, pulse, and                            oxygen saturations were monitored continuously. The                            Endoscope was introduced through the mouth, and      advanced to the second part of duodenum. The upper                            GI endoscopy was accomplished without difficulty.                            The patient tolerated the procedure. Scope In: Scope Out: Findings:                 No gross lesions were noted in the proximal                            esophagus and in the mid esophagus. The distal                            esophagitis has healed however findings as below                            now seen endoscopically.                           Two tongues of salmon-colored mucosa were present                            at 41 cm at the region of the [employer]. No other                            visible abnormalities were present. Biopsies were                            taken with a cold forceps for histology to rule out                            Barrett's.                           A hiatal hernia was found. The proximal extent of  the gastric folds (end of tubular esophagus) was 41                            cm from the incisors. The hiatal narrowing was 43                            cm from the incisors. The Z-line was 41 cm from the                            incisors.                           Localized moderate mucosal changes characterized by                            congestion, erythema, flattening, friability (with                            spontaneous bleeding) and altered texture were                            found on the greater curvature of the gastric body                            distally. Biopsies were taken with a cold forceps                            for histology to rule out adenomatous change or                            dysplasia.                           No other gross lesions were noted in the entire                            examined stomach. Biopsies were taken with a cold                            forceps for histology and Helicobacter pylori                             testing.                           No gross lesions were noted in the duodenal bulb,                            in the first portion of the duodenum and in the                            second portion of the duodenum. Biopsies had been  taken previously without evidence of Celiac. Complications:            No immediate complications. Estimated Blood Loss:     Estimated blood loss was minimal. Impression:               - No gross lesions in proximal/middle esophagus.                            Distal esophagitis has healed.                           - Salmon-colored mucosa suspicious for                            short-segment Barrett's esophagus. Biopsied.                           - Hiatal hernia.                           - Congested, erythematous, flattened, friable (with                            spontaneous bleeding) and texture changed mucosa in                            the greater curvature of the gastric body. Biopsied                            to rule out dysplasia.                           - No other gross lesions in the stomach. Biopsied                            for HP.                           - No gross lesions in the duodenal bulb, in the                            first portion of the duodenum and in the second                            portion of the duodenum. Recommendation:           - Proceed to scheduled colonoscopy.                           - Await pathology results.                           - Increase PPI to 40 mg BID (Rx will be sent to                            pharmacy).                           -  Anticoagulation to be discussed on separate                            Colonoscopy report.                           - Repeat EGD based on biopsy results.                           - The findings and recommendations were discussed                            with the patient. [name], MD 01/11/2019  11:31:42 AM

## 2019-01-11 NOTE — Progress Notes (Signed)
Called to room to assist during endoscopic procedure.  Patient ID and intended procedure confirmed with present staff. Received instructions for my participation in the procedure from the performing physician.  

## 2019-01-11 NOTE — Patient Instructions (Signed)
Thank you for allowing [place] to care for you today!  Await pathology results by mail, approximately 2 weeks.  Will recommend next upper endoscopy at that time.   Increase Prilosec to 40 mg by mouth twice per day.  This new prescription will be sent to your pharmacy.  Recommend adopting a high fiber diet.  Handout given.  Add FIberCon one tablet daily.  Continue taking Iron twice daily.  We will recheck your blood work in 2 months.  The office will call to set this up with you.  Restart Coumadin in  3 days on Sunday 01/14/2019.  Recommend next colonoscopy in 10 years.        YOU HAD AN ENDOSCOPIC PROCEDURE TODAY AT [facility]:   Refer to the procedure report that was given to you for any specific questions about what was found during the examination.  If the procedure report does not answer your questions, please call your gastroenterologist to clarify.  If you requested that your care partner not be given the details of your procedure findings, then the procedure report has been included in a sealed envelope for you to review at your convenience later.  YOU SHOULD EXPECT: Some feelings of bloating in the abdomen. Passage of more gas than usual.  Walking can help get rid of the air that was put into your GI tract during the procedure and reduce the bloating. If you had a lower endoscopy (such as a colonoscopy or flexible sigmoidoscopy) you may notice spotting of blood in your stool or on the toilet paper. If you underwent a bowel prep for your procedure, you may not have a normal bowel movement for a few days.  Please Note:  You might notice some irritation and congestion in your nose or some drainage.  This is from the oxygen used during your procedure.  There is no need for concern and it should clear up in a day or so.  SYMPTOMS TO REPORT IMMEDIATELY:   Following lower endoscopy (colonoscopy or flexible sigmoidoscopy):  Excessive amounts of blood in the  stool  Significant tenderness or worsening of abdominal pains  Swelling of the abdomen that is new, acute  Fever of 100F or higher   Following upper endoscopy (EGD)  Vomiting of blood or coffee ground material  New chest pain or pain under the shoulder blades  Painful or persistently difficult swallowing  New shortness of breath  Fever of 100F or higher  Black, tarry-looking stools  For urgent or emergent issues, a gastroenterologist can be reached at any hour by calling [phone].   DIET:  We do recommend a small meal at first, but then you may proceed to your regular diet.  Drink plenty of fluids but you should avoid alcoholic beverages for 24 hours.  ACTIVITY:  You should plan to take it easy for the rest of today and you should NOT DRIVE or use heavy machinery until tomorrow (because of the sedation medicines used during the test).    FOLLOW UP: Our staff will call the number listed on your records 48-72 hours following your procedure to check on you and address any questions or concerns that you may have regarding the information given to you following your procedure. If we do not reach you, we will leave a message.  We will attempt to reach you two times.  During this call, we will ask if you have developed any symptoms of COVID 19. If you develop any symptoms (ie: fever,  flu-like symptoms, shortness of breath, cough etc.) before then, please call [phone].  If you test positive for Covid 19 in the 2 weeks post procedure, please call and report this information to [place].    If any biopsies were taken you will be contacted by phone or by letter within the next 1-3 weeks.  Please call us at [phone] if you have not heard about the biopsies in 3 weeks.    SIGNATURES/CONFIDENTIALITY: You and/or your care partner have signed paperwork which will be entered into your electronic medical record.  These signatures attest to the fact that that the information above on your  After Visit Summary has been reviewed and is understood.  Full responsibility of the confidentiality of this discharge information lies with you and/or your care-partner.

## 2019-01-11 NOTE — Telephone Encounter (Signed)
error 

## 2019-01-12 ENCOUNTER — Other Ambulatory Visit: Payer: Self-pay

## 2019-01-12 DIAGNOSIS — E611 Iron deficiency: Secondary | ICD-10-CM

## 2019-01-15 ENCOUNTER — Telehealth: Payer: Self-pay | Admitting: *Deleted

## 2019-01-15 NOTE — Telephone Encounter (Signed)
  Follow up Call-  Call back number 01/11/2019 06/06/2018 06/06/2018  Post procedure Call Back phone  # [phone]  Permission to leave phone message Yes Yes Yes  Some recent data might be hidden     Patient questions:  Do you have a fever, pain , or abdominal swelling? No. Pain Score  0 *  Have you tolerated food without any problems? Yes.    Have you been able to return to your normal activities? Yes.    Do you have any questions about your discharge instructions: Diet   No. Medications  No. Follow up visit  No.  Do you have questions or concerns about your Care? No.  Actions: * If pain score is 4 or above: No action needed, pain <4.  1. Have you developed a fever since your procedure? no  2.   Have you had an respiratory symptoms (SOB or cough) since your procedure? no  3.   Have you tested positive for COVID 19 since your procedure no  4.   Have you had any family members/close contacts diagnosed with the COVID 19 since your procedure?  no   If yes to any of these questions please route to Joylene John, RN and Alphonsa Gin, Therapist, sports.

## 2019-01-18 ENCOUNTER — Encounter: Payer: Self-pay | Admitting: Gastroenterology

## 2019-01-22 NOTE — Progress Notes (Deleted)
CC: ***  HPI:  [name] is a 63 y.o. with chronic combined systolic and diastolic heart failure, essential htn, persistent atrial fibrillation, osa, dm2, ckd3, prostate cancer s/p ***. Please see problem based charting for evaluation, assessment, and plan.   htn The patient's blood pressure during this visit was ***. The patient is currently taking spironolactone 50mg  qd, losartan 100mg  qd, imdur 30mg  qd, carvedilol 25mg  bid. His last blood pressure visits are  BP Readings from Last 3 Encounters:  01/11/19 (!) 151/91  12/26/18 (!) 168/98  11/23/18 (!) 159/93    The patient does/does not *** report palpitations, dizziness, chest pain, sob ***.  Assessment and Plan ***  dm2 The patient's last a1c=10.3 in May 2020. The patient's home blood glucose measurements over the past month have ranged ***. The patient does/does not note episodes of hypoglycemia.   The patient is currently taking metformin 2000mg  daily, xultophy 50units qd. The patient is compliant with medication.    Patient's weight changes ***  paf  Amiodarone 200mg  qd  Torsemide 40mg  bid  xarelto 20mg  qd   Past Medical History:  Diagnosis Date  . Abscessed tooth    top back large cavity no pain or drainage, one on bottom  pt pulled tooth 4-5 months ago, right top large hole in tooth  . AKI (acute kidney injury) ([place])   . Allergic rhinitis   . Anemia   . Anxiety   . Asthma   . Atrial fibrillation ([place])   . BPH (benign prostatic hypertrophy)    Massive BPH noted on cystoscopy 1/23/ 2012 by Dr. Risa Grill.  . Cancer [facility])    prostate cancer 2019  . Cardiomyopathy ([place])   . CHF (congestive heart failure) ([place])   . Cough 03/30/2012  . Depression   . Diabetes mellitus 04/08/2008   type 2  . Foley catheter in place 07-05-17 placed  . Headache(784.0)    hx migraines none recent  . Hyperlipemia   . Hypertension   . Hypertensive cardiopathy 03/01/2006   2-D echocardiogram 02/01/2012 showed moderate  LVH, mildly to moderately reduced left ventricular systolic function with an estimated ejection fraction of 40-45%, and diffuse hypokinesis.  A nuclear medicine stress study done 01/31/2012 showed no reversible ischemia, a small mid anterior wall fixed defect/infarct, and ejection fraction 42%.      . Neck pain   . Nephrolithiasis 05/29/2010   CT scan of abdomen/pelvis on 05/29/2010 showed an obstructing approximate 1-2 mm calculus at the left UVJ, and an approximate 1-2 mm left lower pole renal calculus.   Patient had continuing severe pain , and an elevation of his serum creatinine to a value of 1.75 on 06/06/2010.  Patient underwent cystoscopy on 06/08/2010 by Dr. Risa Grill, but attempts at retrograde pyelogram and ureteroscopy were unsucc  . Numbness 01/08/2018  . Obstructive sleep apnea 03/06/2008   Sleep study 03/06/08 showed severe OSA/hypopnea syndrome, with successful CPAP titration to 13 CWP using a medium ResMed Mirage Quattro full face mask with heated humidifier.   . Rash 04/17/2014  . Renal calculus 05/29/2010   CT scan of abdomen/pelvis on 05/29/2010 showed an obstructing approximate 1-2 mm calculus at the left UVJ, and an approximate 1-2 mm left lower pole renal calculus.   Patient had continuing severe pain , and an elevation of his serum creatinine to a value of 1.75 on 06/06/2010.  The stone had apparently passed and was not seen on repeat CT 06/08/2010.  . Sleep apnea    haven't use  cpap in 2 years  . Tooth pain 10/29/2017  . Urinary straining 11/02/2016   Review of Systems:  ***  Physical Exam:  There were no vitals filed for this visit. ***  Assessment & Plan:   See Encounters Tab for problem based charting.  Patient {GC/GE:3044014::"discussed with","seen with"} Dr. {NAMES:[id number]::"Butcher","Granfortuna","E. Hoffman","Mullen","Narendra","Raines","Vincent"}

## 2019-01-23 ENCOUNTER — Encounter: Payer: Medicaid Other | Admitting: Internal Medicine

## 2019-01-23 ENCOUNTER — Encounter: Payer: Self-pay | Admitting: Dietician

## 2019-01-23 ENCOUNTER — Ambulatory Visit: Payer: Medicaid Other | Admitting: Dietician

## 2019-01-23 NOTE — Progress Notes (Signed)
  Diabetes Self-Management Education Telephone visit due to COVID-19. Appointment start time; 1605  Appointment end time: 1625  I connected with  Terry Parks on 01/26/19 by a video enabled telemedicine application and verified that I am speaking with the correct person using two identifiers.   I discussed the limitations of evaluation and management by telemedicine. The patient expressed understanding and agreed to proceed.  Visit Type:  Follow-up  ASSESSMENT Went to dentist- due to follow up again in December Doing foot checks. He is having problems with sleep again.  He denies problems affording or getting medicines or food.  Wants to talk with Dr. Maricela Bo about why he feels so bad. Wants potassium and ph checked per urology.  Self Monitoring: Blood sugars- less than 150, except one time 165;  Blood pressure 130s-140s/70s He denies problems affording or getting medicines or food.  Wants to talk with Dr. Maricela Bo about why he feels so bad. Wants potassium and ph checked per urology.  Medicine:   50 once a day Xultophy- he rerports his is back on metformin  Meal planning: No problems with meal planning, having trouble with constipation. Better now he knows it is from his iron pills. Burping a lot.   Physical activity: walking around the building, cleaning and mopping  He needs ophthalmology referral, Recommend podiatry referrals in the future   Diabetes Self-Management Education - 01/23/19 1600      Visit Information   Visit Type  Follow-up      Individualized Goals (developed by patient)   Reducing Risk  do foot checks daily   go to dentist     Patient Self-Evaluation of Goals - Patient rates self as meeting previously set goals (% of time)   Reducing Risk  >75%      Outcomes   Expected Outcomes  Demonstrated limited interest in learning.  Expect minimal changes;Demonstrated interest in learning. Expect positive outcomes    Future DMSE  4-6 wks    Program Status   Completed      Subsequent Visit   Since your last visit have you continued or begun to take your medications as prescribed?  --   reports he is taking metformin again   Since your last visit have you had your blood pressure checked?  Yes    Is your most recent blood pressure lower, unchanged, or higher since your last visit?  Lower    Since your last visit have you experienced any weight changes?  No change    Since your last visit, are you checking your blood glucose at least once a day?  Yes       Individualized Plan for Diabetes Self-Management Training:   Learning Objective:  Patient will have a greater understanding of diabetes self-management. Patient education plan is to attend individual and/or group sessions per assessed needs and concerns.   Plan:   There are no Patient Instructions on file for this visit.   Expected Outcomes:  Demonstrated limited interest in learning.  Expect minimal changes, Demonstrated interest in learning. Expect positive outcomesimrpoved blood sugars Education material provided: no If problems or questions, patient to contact team via:  Phone Future DSME appointment: - 4-6 wks 1 month scheduled Debera Lat, RD 01/26/2019 3:11 PM.

## 2019-01-26 ENCOUNTER — Telehealth: Payer: Self-pay | Admitting: Internal Medicine

## 2019-01-26 NOTE — Telephone Encounter (Signed)
  [facility] Internal Medicine Residency Telephone Encounter Continuity Care Appointment     This telephone encounter was created for [name] on 01/26/2019 for the following purpose/cc weakness as notified by Ms. [name] Pyler.  Called and spoke to patient about his symptoms, he said that he has not been sleeping well, has dental pain, lower extremity swelling, and weakness.   Patient has missed his appointment 9/8  Rescheduled appointment for next week   [name], MD Internal Medicine PGY3 [id number] 01/26/2019, 4:14 PM

## 2019-01-26 NOTE — Patient Instructions (Addendum)
Mr. Bonello,  I am sorry you are not feeling well.  Dr. Maricela Bo is in the office the month of September.   Your A1C and flu vaccine are due.   I suggest you call for an appointment [place] [phone]

## 2019-01-28 NOTE — Progress Notes (Deleted)
CC: ***  HPI:  [name] is a 63 y.o. male with chronic combined heart failure, essential htn, persistent atrial fibrillation, osa, dm2, ckd3 who presents for follow up. Please see problem based charting for evaluation, assessment, and plan.   htn The patient's blood pressure during this visit was ***. The patient is currently taking spironolactone 50mg  qd, losartan 100mg  qd, imdur 30mg  qd, carvedilol 25mg  bid. His last blood pressure visits are  BP Readings from Last 3 Encounters:  01/11/19 (!) 151/91  12/26/18 (!) 168/98  11/23/18 (!) 159/93    The patient does/does not *** report palpitations, dizziness, chest pain, sob ***.  Assessment and Plan ***  dm2 The patient's last a1c=10.3 in May 2020. The patient's home blood glucose measurements over the past month have ranged ***. The patient does/does not note episodes of hypoglycemia.   The patient is currently taking metformin 2000mg  daily, xultophy 50units qd. The patient is compliant with medication.    Patient's weight changes ***  paf  Amiodarone 200mg  qd  Torsemide 40mg  bid  xarelto 20mg  qd   Past Medical History:  Diagnosis Date  . Abscessed tooth    top back large cavity no pain or drainage, one on bottom  pt pulled tooth 4-5 months ago, right top large hole in tooth  . AKI (acute kidney injury) ([place])   . Allergic rhinitis   . Anemia   . Anxiety   . Asthma   . Atrial fibrillation ([place])   . BPH (benign prostatic hypertrophy)    Massive BPH noted on cystoscopy 1/23/ 2012 by Dr. Risa Grill.  . Cancer [facility])    prostate cancer 2019  . Cardiomyopathy ([place])   . CHF (congestive heart failure) ([place])   . Cough 03/30/2012  . Depression   . Diabetes mellitus 04/08/2008   type 2  . Foley catheter in place 07-05-17 placed  . Headache(784.0)    hx migraines none recent  . Hyperlipemia   . Hypertension   . Hypertensive cardiopathy 03/01/2006   2-D echocardiogram 02/01/2012 showed moderate LVH, mildly to  moderately reduced left ventricular systolic function with an estimated ejection fraction of 40-45%, and diffuse hypokinesis.  A nuclear medicine stress study done 01/31/2012 showed no reversible ischemia, a small mid anterior wall fixed defect/infarct, and ejection fraction 42%.      . Neck pain   . Nephrolithiasis 05/29/2010   CT scan of abdomen/pelvis on 05/29/2010 showed an obstructing approximate 1-2 mm calculus at the left UVJ, and an approximate 1-2 mm left lower pole renal calculus.   Patient had continuing severe pain , and an elevation of his serum creatinine to a value of 1.75 on 06/06/2010.  Patient underwent cystoscopy on 06/08/2010 by Dr. Risa Grill, but attempts at retrograde pyelogram and ureteroscopy were unsucc  . Numbness 01/08/2018  . Obstructive sleep apnea 03/06/2008   Sleep study 03/06/08 showed severe OSA/hypopnea syndrome, with successful CPAP titration to 13 CWP using a medium ResMed Mirage Quattro full face mask with heated humidifier.   . Rash 04/17/2014  . Renal calculus 05/29/2010   CT scan of abdomen/pelvis on 05/29/2010 showed an obstructing approximate 1-2 mm calculus at the left UVJ, and an approximate 1-2 mm left lower pole renal calculus.   Patient had continuing severe pain , and an elevation of his serum creatinine to a value of 1.75 on 06/06/2010.  The stone had apparently passed and was not seen on repeat CT 06/08/2010.  . Sleep apnea    haven't use cpap  in 2 years  . Tooth pain 10/29/2017  . Urinary straining 11/02/2016   Review of Systems:  ***  Physical Exam:  There were no vitals filed for this visit. ***  Assessment & Plan:   See Encounters Tab for problem based charting.  Patient {GC/GE:3044014::"discussed with","seen with"} Dr. {NAMES:[id number]::"Butcher","Granfortuna","E. Hoffman","Mullen","Narendra","Raines","Vincent"}

## 2019-01-30 ENCOUNTER — Other Ambulatory Visit: Payer: Self-pay

## 2019-01-30 ENCOUNTER — Encounter: Payer: Medicaid Other | Admitting: Internal Medicine

## 2019-01-31 ENCOUNTER — Encounter: Payer: Self-pay | Admitting: Internal Medicine

## 2019-01-31 ENCOUNTER — Ambulatory Visit (INDEPENDENT_AMBULATORY_CARE_PROVIDER_SITE_OTHER): Payer: Medicaid Other | Admitting: Internal Medicine

## 2019-01-31 ENCOUNTER — Ambulatory Visit: Payer: Medicaid Other

## 2019-01-31 VITALS — BP 152/83 | HR 83 | Temp 98.0°F | Wt 329.8 lb

## 2019-01-31 DIAGNOSIS — F32A Depression, unspecified: Secondary | ICD-10-CM | POA: Insufficient documentation

## 2019-01-31 DIAGNOSIS — Z7901 Long term (current) use of anticoagulants: Secondary | ICD-10-CM | POA: Diagnosis not present

## 2019-01-31 DIAGNOSIS — Z79899 Other long term (current) drug therapy: Secondary | ICD-10-CM

## 2019-01-31 DIAGNOSIS — I10 Essential (primary) hypertension: Secondary | ICD-10-CM | POA: Diagnosis not present

## 2019-01-31 DIAGNOSIS — N183 Chronic kidney disease, stage 3 (moderate): Secondary | ICD-10-CM

## 2019-01-31 DIAGNOSIS — Z6841 Body Mass Index (BMI) 40.0 and over, adult: Secondary | ICD-10-CM

## 2019-01-31 DIAGNOSIS — E1142 Type 2 diabetes mellitus with diabetic polyneuropathy: Secondary | ICD-10-CM

## 2019-01-31 DIAGNOSIS — E1122 Type 2 diabetes mellitus with diabetic chronic kidney disease: Secondary | ICD-10-CM | POA: Diagnosis not present

## 2019-01-31 DIAGNOSIS — F329 Major depressive disorder, single episode, unspecified: Secondary | ICD-10-CM

## 2019-01-31 DIAGNOSIS — I130 Hypertensive heart and chronic kidney disease with heart failure and stage 1 through stage 4 chronic kidney disease, or unspecified chronic kidney disease: Secondary | ICD-10-CM

## 2019-01-31 DIAGNOSIS — G4733 Obstructive sleep apnea (adult) (pediatric): Secondary | ICD-10-CM

## 2019-01-31 DIAGNOSIS — I4819 Other persistent atrial fibrillation: Secondary | ICD-10-CM

## 2019-01-31 DIAGNOSIS — I5042 Chronic combined systolic (congestive) and diastolic (congestive) heart failure: Secondary | ICD-10-CM

## 2019-01-31 DIAGNOSIS — Z794 Long term (current) use of insulin: Secondary | ICD-10-CM | POA: Diagnosis not present

## 2019-01-31 DIAGNOSIS — E1159 Type 2 diabetes mellitus with other circulatory complications: Secondary | ICD-10-CM

## 2019-01-31 LAB — POCT GLYCOSYLATED HEMOGLOBIN (HGB A1C): Hemoglobin A1C: 7.4 % — AB (ref 4.0–5.6)

## 2019-01-31 LAB — GLUCOSE, CAPILLARY: Glucose-Capillary: 123 mg/dL — ABNORMAL HIGH (ref 70–99)

## 2019-01-31 MED ORDER — AMLODIPINE-ATORVASTATIN 5-40 MG PO TABS: 1.0000 | ORAL_TABLET | Freq: Every day | ORAL | 1 refills | Status: AC

## 2019-01-31 MED ORDER — TORSEMIDE 20 MG PO TABS: 50.0000 mg | ORAL_TABLET | Freq: Two times a day (BID) | ORAL | 2 refills | Status: DC

## 2019-01-31 NOTE — Assessment & Plan Note (Signed)
Patient cannot use cpap till he gets his dental caries are managed as mask is painful for him.

## 2019-01-31 NOTE — Assessment & Plan Note (Signed)
Continue Amiodarone 200mg  qd, Torsemide 50mg  bid,  xarelto 20mg  qd

## 2019-01-31 NOTE — Assessment & Plan Note (Signed)
Patient has gained approximately 45lbs in the past 1 year.   Assessment and plan  Had at length discussion about how even a 5% weight reduction will have large health benefits for him. Worked with patient on diet and exercise plan for weight reduction. I offered him weight loss pills, but he refused. If the patient's weight persists despite efforts to decrease, he may need bariatric surgery.

## 2019-01-31 NOTE — Assessment & Plan Note (Signed)
Patient states that he has been feeling depressed recently. He has been having decreased appetite, interrupted sleep, having a "foggy mind", decreased energy, and difficulty concentrating. Scored 18 on PHQ9. Patient takes cymbalta 60mg  qd, but states that he continues to have depressive symptoms despite taking medication.   -Referral to integrated behavioral health.

## 2019-01-31 NOTE — Progress Notes (Signed)
CC: Diabetes and chronic disease follow up  HPI:  [name] is a 63 y.o. with essential hypertension, combined heart failure, osa, persistent atrial fibrillation, dm2, ckd3 who presents for follow up of diabetes. Please see problem based charting for evaluation, assessment, and plan.  Past Medical History:  Diagnosis Date  . Abscessed tooth    top back large cavity no pain or drainage, one on bottom  pt pulled tooth 4-5 months ago, right top large hole in tooth  . AKI (acute kidney injury) ([place])   . Allergic rhinitis   . Anemia   . Anxiety   . Asthma   . Atrial fibrillation ([place])   . BPH (benign prostatic hypertrophy)    Massive BPH noted on cystoscopy 1/23/ 2012 by Dr. [name].  . Cancer [facility])    prostate cancer 2019  . Cardiomyopathy ([place])   . CHF (congestive heart failure) ([place])   . Cough 03/30/2012  . Depression   . Diabetes mellitus 04/08/2008   type 2  . Foley catheter in place 07-05-17 placed  . Headache(784.0)    hx migraines none recent  . Hyperlipemia   . Hypertension   . Hypertensive cardiopathy 03/01/2006   2-D echocardiogram 02/01/2012 showed moderate LVH, mildly to moderately reduced left ventricular systolic function with an estimated ejection fraction of 40-45%, and diffuse hypokinesis.  A nuclear medicine stress study done 01/31/2012 showed no reversible ischemia, a small mid anterior wall fixed defect/infarct, and ejection fraction 42%.      . Neck pain   . Nephrolithiasis 05/29/2010   CT scan of abdomen/pelvis on 05/29/2010 showed an obstructing approximate 1-2 mm calculus at the left UVJ, and an approximate 1-2 mm left lower pole renal calculus.   Patient had continuing severe pain , and an elevation of his serum creatinine to a value of 1.75 on 06/06/2010.  Patient underwent cystoscopy on 06/08/2010 by Dr. [name], but attempts at retrograde pyelogram and ureteroscopy were unsucc  . Numbness 01/08/2018  . Obstructive sleep apnea 03/06/2008   Sleep study  03/06/08 showed severe OSA/hypopnea syndrome, with successful CPAP titration to 13 CWP using a medium ResMed Mirage Quattro full face mask with heated humidifier.   . Rash 04/17/2014  . Renal calculus 05/29/2010   CT scan of abdomen/pelvis on 05/29/2010 showed an obstructing approximate 1-2 mm calculus at the left UVJ, and an approximate 1-2 mm left lower pole renal calculus.   Patient had continuing severe pain , and an elevation of his serum creatinine to a value of 1.75 on 06/06/2010.  The stone had apparently passed and was not seen on repeat CT 06/08/2010.  . Sleep apnea    haven't use cpap in 2 years  . Tooth pain 10/29/2017  . Urinary straining 11/02/2016   Review of Systems:    Review of Systems  Constitutional: Negative for chills and fever.  Respiratory: Negative for cough and shortness of breath.   Cardiovascular: Positive for chest pain.  Gastrointestinal: Negative for abdominal pain, nausea and vomiting.  Neurological: Negative for dizziness and headaches.  Psychiatric/Behavioral: Positive for depression.   Physical Exam:  Vitals:   01/31/19 0903 01/31/19 0916  BP: (!) 190/91 (!) 152/83  Pulse: 92 83  Temp: 98 F (36.7 C)   TempSrc: Oral   SpO2: 100%   Weight: (!) 329 lb 12.8 oz (149.6 kg)    Physical Exam  Constitutional: He is oriented to person, place, and time. He appears well-developed and well-nourished.  HENT:  Head: Normocephalic and  atraumatic.  Cardiovascular: Normal rate and regular rhythm.  Respiratory: Effort normal and breath sounds normal. No respiratory distress. He has no wheezes.  GI: Soft. Bowel sounds are normal. He exhibits distension. There is no abdominal tenderness.  Musculoskeletal:        General: No edema.  Neurological: He is alert and oriented to person, place, and time.  Psychiatric: His speech is normal and behavior is normal. Thought content normal. His mood appears not anxious. His affect is labile. He exhibits a depressed mood.      Assessment & Plan:   See Encounters Tab for problem based charting.  Patient discussed with Dr. Lynnae January

## 2019-01-31 NOTE — Patient Instructions (Addendum)
It was a pleasure to see you today Terry Parks. Please make the following changes:  Please review the medication list on this paper and make sure that you are taking all of your medication   For depression I would like for you to please follow up with our behavioral health counselor Dessie Coma   Please increase torsemide to 50mg  twice daily   Our pharmacist will follow with you to do a medication review with you   Please make sure to work on weight loss: Weight loss goal for 2 weeks follow up is 4-5lbs  If you have any questions or concerns, please call our clinic at [phone] between 9am-5pm and after hours call [phone] and ask for the internal medicine resident on call. If you feel you are having a medical emergency please call 911.   Thank you, we look forward to help you remain healthy!  [name], MD Internal Medicine PGY3   Exercising to Lose Weight Exercise is structured, repetitive physical activity to improve fitness and health. Getting regular exercise is important for everyone. It is especially important if you are overweight. Being overweight increases your risk of heart disease, stroke, diabetes, high blood pressure, and several types of cancer. Reducing your calorie intake and exercising can help you lose weight. Exercise is usually categorized as moderate or vigorous intensity. To lose weight, most people need to do a certain amount of moderate-intensity or vigorous-intensity exercise each week. Moderate-intensity exercise  Moderate-intensity exercise is any activity that gets you moving enough to burn at least three times more energy (calories) than if you were sitting. Examples of moderate exercise include:  Walking a mile in 15 minutes.  Doing light yard work.  Biking at an easy pace. Most people should get at least 150 minutes (2 hours and 30 minutes) a week of moderate-intensity exercise to maintain their body weight. Vigorous-intensity  exercise Vigorous-intensity exercise is any activity that gets you moving enough to burn at least six times more calories than if you were sitting. When you exercise at this intensity, you should be working hard enough that you are not able to carry on a conversation. Examples of vigorous exercise include:  Running.  Playing a team sport, such as football, basketball, and soccer.  Jumping rope. Most people should get at least 75 minutes (1 hour and 15 minutes) a week of vigorous-intensity exercise to maintain their body weight. How can exercise affect me? When you exercise enough to burn more calories than you eat, you lose weight. Exercise also reduces body fat and builds muscle. The more muscle you have, the more calories you burn. Exercise also:  Improves mood.  Reduces stress and tension.  Improves your overall fitness, flexibility, and endurance.  Increases bone strength. The amount of exercise you need to lose weight depends on:  Your age.  The type of exercise.  Any health conditions you have.  Your overall physical ability. Talk to your health care provider about how much exercise you need and what types of activities are safe for you. What actions can I take to lose weight? Nutrition   Make changes to your diet as told by your health care provider or diet and nutrition specialist (dietitian). This may include: ? Eating fewer calories. ? Eating more protein. ? Eating less unhealthy fats. ? Eating a diet that includes fresh fruits and vegetables, whole grains, low-fat dairy products, and lean protein. ? Avoiding foods with added fat, salt, and sugar.  Drink plenty of water  while you exercise to prevent dehydration or heat stroke. Activity  Choose an activity that you enjoy and set realistic goals. Your health care provider can help you make an exercise plan that works for you.  Exercise at a moderate or vigorous intensity most days of the week. ? The intensity of  exercise may vary from person to person. You can tell how intense a workout is for you by paying attention to your breathing and heartbeat. Most people will notice their breathing and heartbeat get faster with more intense exercise.  Do resistance training twice each week, such as: ? Push-ups. ? Sit-ups. ? Lifting weights. ? Using resistance bands.  Getting short amounts of exercise can be just as helpful as long structured periods of exercise. If you have trouble finding time to exercise, try to include exercise in your daily routine. ? Get up, stretch, and walk around every 30 minutes throughout the day. ? Go for a walk during your lunch break. ? Park your car farther away from your destination. ? If you take public transportation, get off one stop early and walk the rest of the way. ? Make phone calls while standing up and walking around. ? Take the stairs instead of elevators or escalators.  Wear comfortable clothes and shoes with good support.  Do not exercise so much that you hurt yourself, feel dizzy, or get very short of breath. Where to find more information  U.S. Department of Health and Human Services: BondedCompany.at  Centers for Disease Control and Prevention (CDC): http://www.wolf.info/ Contact a health care provider:  Before starting a new exercise program.  If you have questions or concerns about your weight.  If you have a medical problem that keeps you from exercising. Get help right away if you have any of the following while exercising:  Injury.  Dizziness.  Difficulty breathing or shortness of breath that does not go away when you stop exercising.  Chest pain.  Rapid heartbeat. Summary  Being overweight increases your risk of heart disease, stroke, diabetes, high blood pressure, and several types of cancer.  Losing weight happens when you burn more calories than you eat.  Reducing the amount of calories you eat in addition to getting regular moderate or vigorous  exercise each week helps you lose weight. This information is not intended to replace advice given to you by your health care provider. Make sure you discuss any questions you have with your health care provider. Document Released: 06/05/2010 Document Revised: 05/16/2017 Document Reviewed: 05/16/2017 Elsevier Patient Education  2020 [employer].   Preventing Unhealthy [employer], Adult Staying at a healthy weight is important to your overall health. When fat builds up in your body, you may become overweight or obese. Being overweight or obese increases your risk of developing certain health problems, such as heart disease, diabetes, sleeping problems, joint problems, and some types of cancer. Unhealthy weight gain is often the result of making unhealthy food choices or not getting enough exercise. You can make changes to your lifestyle to prevent obesity and stay as healthy as possible. What nutrition changes can be made?   Eat only as much as your body needs. To do this: ? Pay attention to signs that you are hungry or full. Stop eating as soon as you feel full. ? If you feel hungry, try drinking water first before eating. Drink enough water so your urine is clear or pale yellow. ? Eat smaller portions. Pay attention to portion sizes when eating out. ?  Look at serving sizes on food labels. Most foods contain more than one serving per container. ? Eat the recommended number of calories for your gender and activity level. For most active people, a daily total of 2,000 calories is appropriate. If you are trying to lose weight or are not very active, you may need to eat fewer calories. Talk with your health care provider or a diet and nutrition specialist (dietitian) about how many calories you need each day.  Choose healthy foods, such as: ? Fruits and vegetables. At each meal, try to fill at least half of your plate with fruits and vegetables. ? Whole grains, such as whole-wheat bread, brown  rice, and quinoa. ? Lean meats, such as chicken or fish. ? Other healthy proteins, such as beans, eggs, or tofu. ? Healthy fats, such as nuts, seeds, fatty fish, and olive oil. ? Low-fat or fat-free dairy products.  Check food labels, and avoid food and drinks that: ? Are high in calories. ? Have added sugar. ? Are high in sodium. ? Have saturated fats or trans fats.  Cook foods in healthier ways, such as by baking, broiling, or grilling.  Make a meal plan for the week, and shop with a grocery list to help you stay on track with your purchases. Try to avoid going to the grocery store when you are hungry.  When grocery shopping, try to shop around the outside of the store first, where the fresh foods are. Doing this helps you to avoid prepackaged foods, which can be high in sugar, salt (sodium), and fat. What lifestyle changes can be made?   Exercise for 30 or more minutes on 5 or more days each week. Exercising may include brisk walking, yard work, biking, running, swimming, and team sports like basketball and soccer. Ask your health care provider which exercises are safe for you.  Do muscle-strengthening activities, such as lifting weights or using resistance bands, on 2 or more days a week.  Do not use any products that contain nicotine or tobacco, such as cigarettes and e-cigarettes. If you need help quitting, ask your health care provider.  Limit alcohol intake to no more than 1 drink a day for nonpregnant women and 2 drinks a day for men. One drink equals 12 oz of beer, 5 oz of wine, or 1 oz of hard liquor.  Try to get 7-9 hours of sleep each night. What other changes can be made?  Keep a food and activity journal to keep track of: ? What you ate and how many calories you had. Remember to count the calories in sauces, dressings, and side dishes. ? Whether you were active, and what exercises you did. ? Your calorie, weight, and activity goals.  Check your weight regularly.  Track any changes. If you notice you have gained weight, make changes to your diet or activity routine.  Avoid taking weight-loss medicines or supplements. Talk to your health care provider before starting any new medicine or supplement.  Talk to your health care provider before trying any new diet or exercise plan. Why are these changes important? Eating healthy, staying active, and having healthy habits can help you to prevent obesity. Those changes also:  Help you manage stress and emotions.  Help you connect with friends and family.  Improve your self-esteem.  Improve your sleep.  Prevent long-term health problems. What can happen if changes are not made? Being obese or overweight can cause you to develop joint or bone problems, which  can make it hard for you to stay active or do activities you enjoy. Being obese or overweight also puts stress on your heart and lungs and can lead to health problems like diabetes, heart disease, and some cancers. Where to find more information Talk with your health care provider or a dietitian about healthy eating and healthy lifestyle choices. You may also find information from:  U.S. Department of Agriculture, MyPlate: FormerBoss.no  American Heart Association: www.heart.org  Centers for Disease Control and Prevention: http://www.wolf.info/ Summary  Staying at a healthy weight is important to your overall health. It helps you to prevent certain diseases and health problems, such as heart disease, diabetes, joint problems, sleep disorders, and some types of cancer.  Being obese or overweight can cause you to develop joint or bone problems, which can make it hard for you to stay active or do activities you enjoy.  You can prevent unhealthy weight gain by eating a healthy diet, exercising regularly, not smoking, limiting alcohol, and getting enough sleep.  Talk with your health care provider or a dietitian for guidance about healthy eating and  healthy lifestyle choices. This information is not intended to replace advice given to you by your health care provider. Make sure you discuss any questions you have with your health care provider. Document Released: 05/04/2016 Document Revised: 05/06/2017 Document Reviewed: 06/09/2016 Elsevier Patient Education  2020 [employer].

## 2019-01-31 NOTE — Assessment & Plan Note (Signed)
The patient's last a1c=10.3 in May 2020 and at this visit was 7.4. The patient's home blood glucose measurements over the past month have ranged 150-300s. The patient does not note episodes of hypoglycemia.   The patient is currently taking metformin 1000mg  bidy, xultophy 50units qd. The patient is compliant with medication.    The patient has gained 8lbs in the past one month.   Assessment and plan  Congratulated patient on a1c reduction. Continue current regimen. Encouraged him to lose weight.

## 2019-01-31 NOTE — Assessment & Plan Note (Signed)
The patient's blood pressure during this visit was 190/91 and repeat 152/83 The patient is currently taking spironolactone 50mg  qd, losartan 100mg  qd, imdur 30mg  qd, carvedilol 25mg  bid. His last blood pressure visits are   BP Readings from Last 3 Encounters:  01/31/19 (!) 152/83  01/11/19 (!) 151/91  12/26/18 (!) 168/98   The patient is on numerous medications and it is likely that he has been missing a few doses of some medications. I have printed the patient an updated list of his medication and asked for him to review it.   We spoke about lifestyle changes to lose weight.   -continue current medication  -continue working with donna on lifestyle changes  -will follow up regarding blood pressure in 2-4 weeks  -pharmacy referral  to do teaching and medication review with patient

## 2019-01-31 NOTE — Assessment & Plan Note (Signed)
>>  ASSESSMENT AND PLAN FOR MORBID OBESITY WRITTEN ON 01/31/2019  2:29 PM BY Lorenso Courier, MD  Patient has gained approximately 45lbs in the past 1 year.   Assessment and plan  Had at length discussion about how even a 5% weight reduction will have large health benefits for him. Worked with patient on diet and exercise plan for weight reduction. I offered him weight loss pills, but he refused. If the patient's weight persists despite efforts to decrease, he may need bariatric surgery.

## 2019-01-31 NOTE — Assessment & Plan Note (Signed)
The patient appears have peripheral edema on examination. He also seems to be having rapid weight gain over the past few months.   -increased torsemide from 40mg  bid to 50mg  bid  -continue spironolactone 50mg  qd  -continue losartan 100mg  qd  -continue imdur 30mg  qd

## 2019-02-01 LAB — BMP8+ANION GAP
Anion Gap: 16 mmol/L (ref 10.0–18.0)
BUN/Creatinine Ratio: 5 — ABNORMAL LOW (ref 10–24)
BUN: 6 mg/dL — ABNORMAL LOW (ref 8–27)
CO2: 22 mmol/L (ref 20–29)
Calcium: 8.5 mg/dL — ABNORMAL LOW (ref 8.6–10.2)
Chloride: 105 mmol/L (ref 96–106)
Creatinine, Ser: 1.31 mg/dL — ABNORMAL HIGH (ref 0.76–1.27)
GFR calc Af Amer: 67 mL/min/{1.73_m2} (ref >59)
GFR calc non Af Amer: 58 mL/min/{1.73_m2} — ABNORMAL LOW (ref >59)
Glucose: 131 mg/dL — ABNORMAL HIGH (ref 65–99)
Potassium: 3.3 mmol/L — ABNORMAL LOW (ref 3.5–5.2)
Sodium: 143 mmol/L (ref 134–144)

## 2019-02-06 ENCOUNTER — Telehealth: Payer: Self-pay

## 2019-02-06 NOTE — Telephone Encounter (Signed)
-----   Message from Timothy Lasso, RN sent at 01/18/2019  3:01 PM EDT ----- Regarding: FW: Follow-up  ----- Message ----- From: Irving Copas., MD Sent: 01/18/2019   2:59 PM EDT To: Timothy Lasso, RN Subject: Follow-up                                      [name],Can you please make sure that the patient has a follow-up in clinic set up so that when he has his labs completed in October that I can see him about a week after they are done?Thank you.GM

## 2019-02-06 NOTE — Telephone Encounter (Signed)
appt made for 10/22 10:10 am pt notified via My Chart.  He was also notified to have labs prior

## 2019-02-06 NOTE — Progress Notes (Signed)
Internal Medicine Clinic Attending  Case discussed with Dr. [name] at the time of the visit.  We reviewed the resident's history and exam and pertinent patient test results.  I agree with the assessment, diagnosis, and plan of care documented in the resident's note. 

## 2019-02-09 ENCOUNTER — Telehealth: Payer: Self-pay | Admitting: *Deleted

## 2019-02-09 MED FILL — AMLODIPINE-ATORVAST 5-40 MG: 5-40 | 90 days supply | Qty: 90 | Fill #0

## 2019-02-09 NOTE — Telephone Encounter (Signed)
Information was sent to [facility] Tr[facility] for PA for Amlodipine-Atorvastin 5-40 mg tablets.  Approved 02/09/2019 thru 02/09/2020.  Conf# - [id number].   PA #- [id number]. [facility] was called and notified of. Sander Nephew, RN 02/09/2019 11:43 AM.

## 2019-02-12 ENCOUNTER — Ambulatory Visit: Payer: Medicaid Other

## 2019-02-13 ENCOUNTER — Encounter: Payer: Self-pay | Admitting: Licensed Clinical Social Worker

## 2019-02-13 ENCOUNTER — Ambulatory Visit (INDEPENDENT_AMBULATORY_CARE_PROVIDER_SITE_OTHER): Payer: Medicaid Other | Admitting: Licensed Clinical Social Worker

## 2019-02-13 ENCOUNTER — Ambulatory Visit (INDEPENDENT_AMBULATORY_CARE_PROVIDER_SITE_OTHER): Payer: Medicaid Other | Admitting: Internal Medicine

## 2019-02-13 ENCOUNTER — Telehealth: Payer: Self-pay | Admitting: Licensed Clinical Social Worker

## 2019-02-13 ENCOUNTER — Encounter: Payer: Self-pay | Admitting: Internal Medicine

## 2019-02-13 ENCOUNTER — Other Ambulatory Visit: Payer: Self-pay

## 2019-02-13 VITALS — BP 164/85 | HR 83 | Temp 98.0°F | Ht 73.0 in | Wt 318.9 lb

## 2019-02-13 DIAGNOSIS — G4733 Obstructive sleep apnea (adult) (pediatric): Secondary | ICD-10-CM | POA: Diagnosis not present

## 2019-02-13 DIAGNOSIS — N183 Chronic kidney disease, stage 3 (moderate): Secondary | ICD-10-CM | POA: Diagnosis not present

## 2019-02-13 DIAGNOSIS — Z79899 Other long term (current) drug therapy: Secondary | ICD-10-CM | POA: Diagnosis not present

## 2019-02-13 DIAGNOSIS — F329 Major depressive disorder, single episode, unspecified: Secondary | ICD-10-CM

## 2019-02-13 DIAGNOSIS — E1142 Type 2 diabetes mellitus with diabetic polyneuropathy: Secondary | ICD-10-CM | POA: Diagnosis not present

## 2019-02-13 DIAGNOSIS — I4819 Other persistent atrial fibrillation: Secondary | ICD-10-CM

## 2019-02-13 DIAGNOSIS — F32A Depression, unspecified: Secondary | ICD-10-CM

## 2019-02-13 DIAGNOSIS — I5042 Chronic combined systolic (congestive) and diastolic (congestive) heart failure: Secondary | ICD-10-CM

## 2019-02-13 DIAGNOSIS — Z2821 Immunization not carried out because of patient refusal: Secondary | ICD-10-CM

## 2019-02-13 DIAGNOSIS — F331 Major depressive disorder, recurrent, moderate: Secondary | ICD-10-CM

## 2019-02-13 DIAGNOSIS — Z794 Long term (current) use of insulin: Secondary | ICD-10-CM

## 2019-02-13 DIAGNOSIS — I130 Hypertensive heart and chronic kidney disease with heart failure and stage 1 through stage 4 chronic kidney disease, or unspecified chronic kidney disease: Secondary | ICD-10-CM | POA: Diagnosis not present

## 2019-02-13 DIAGNOSIS — E1159 Type 2 diabetes mellitus with other circulatory complications: Secondary | ICD-10-CM

## 2019-02-13 DIAGNOSIS — E1122 Type 2 diabetes mellitus with diabetic chronic kidney disease: Secondary | ICD-10-CM | POA: Diagnosis not present

## 2019-02-13 DIAGNOSIS — R32 Unspecified urinary incontinence: Secondary | ICD-10-CM | POA: Diagnosis not present

## 2019-02-13 DIAGNOSIS — Z0000 Encounter for general adult medical examination without abnormal findings: Secondary | ICD-10-CM

## 2019-02-13 LAB — GLUCOSE, CAPILLARY: Glucose-Capillary: 127 mg/dL — ABNORMAL HIGH (ref 70–99)

## 2019-02-13 NOTE — Assessment & Plan Note (Signed)
Patient had first appointment with [name] ([place] counselor) today 9/29 and spoke about his frustrations about being on numerous medications, difficulty sleeping, and depression.   Assessment and plan  Counseled patient on sleep hygiene. Patient is to continue following with [facility].

## 2019-02-13 NOTE — Assessment & Plan Note (Signed)
Patient refused influenza vaccination during this visit.

## 2019-02-13 NOTE — Assessment & Plan Note (Signed)
  The patient's last TTE was done on 01/10/18 which showed ef 35-40%, diffuse hypokinesis, pseudonormal left ventricular filling patter, g2dd.   -ordered TTE to evaluate patient's heart function currently and see if there has to be any changes in management.

## 2019-02-13 NOTE — Progress Notes (Signed)
CC: Hypertension follow up  HPI:  [name] is a 63 y.o. male with essential htn, combined heart failure, osa, persistent afib, dm2, ckd3 who presents for follow up of hypertension. Please see problem based charting for evaluation, assessment, and plan.  Past Medical History:  Diagnosis Date  . Abscessed tooth    top back large cavity no pain or drainage, one on bottom  pt pulled tooth 4-5 months ago, right top large hole in tooth  . AKI (acute kidney injury) ([place])   . Allergic rhinitis   . Anemia   . Anxiety   . Asthma   . Atrial fibrillation ([place])   . BPH (benign prostatic hypertrophy)    Massive BPH noted on cystoscopy 1/23/ 2012 by Dr. Risa Grill.  . Cancer [facility])    prostate cancer 2019  . Cardiomyopathy ([place])   . CHF (congestive heart failure) ([place])   . Cough 03/30/2012  . Depression   . Diabetes mellitus 04/08/2008   type 2  . Foley catheter in place 07-05-17 placed  . Headache(784.0)    hx migraines none recent  . Hyperlipemia   . Hypertension   . Hypertensive cardiopathy 03/01/2006   2-D echocardiogram 02/01/2012 showed moderate LVH, mildly to moderately reduced left ventricular systolic function with an estimated ejection fraction of 40-45%, and diffuse hypokinesis.  A nuclear medicine stress study done 01/31/2012 showed no reversible ischemia, a small mid anterior wall fixed defect/infarct, and ejection fraction 42%.      . Neck pain   . Nephrolithiasis 05/29/2010   CT scan of abdomen/pelvis on 05/29/2010 showed an obstructing approximate 1-2 mm calculus at the left UVJ, and an approximate 1-2 mm left lower pole renal calculus.   Patient had continuing severe pain , and an elevation of his serum creatinine to a value of 1.75 on 06/06/2010.  Patient underwent cystoscopy on 06/08/2010 by Dr. Risa Grill, but attempts at retrograde pyelogram and ureteroscopy were unsucc  . Numbness 01/08/2018  . Obstructive sleep apnea 03/06/2008   Sleep study 03/06/08 showed severe  OSA/hypopnea syndrome, with successful CPAP titration to 13 CWP using a medium ResMed Mirage Quattro full face mask with heated humidifier.   . Rash 04/17/2014  . Renal calculus 05/29/2010   CT scan of abdomen/pelvis on 05/29/2010 showed an obstructing approximate 1-2 mm calculus at the left UVJ, and an approximate 1-2 mm left lower pole renal calculus.   Patient had continuing severe pain , and an elevation of his serum creatinine to a value of 1.75 on 06/06/2010.  The stone had apparently passed and was not seen on repeat CT 06/08/2010.  . Sleep apnea    haven't use cpap in 2 years  . Tooth pain 10/29/2017  . Urinary straining 11/02/2016   Review of Systems:    Review of Systems  Constitutional: Negative for chills and fever.  Respiratory: Negative for cough and shortness of breath.   Gastrointestinal: Negative for abdominal pain, nausea and vomiting.   Physical Exam:  Vitals:   02/13/19 1501 02/13/19 1540  BP: (!) 146/98 (!) 164/85  Pulse: 90 83  Temp: 98 F (36.7 C)   SpO2: 100%   Weight: (!) 318 lb 14.4 oz (144.7 kg)   Height: 6\' 1"  (1.854 m)    Physical Exam  Constitutional: He is oriented to person, place, and time. He appears well-developed and well-nourished. No distress.  Obese body habitus  HENT:  Head: Normocephalic and atraumatic.  Eyes: Conjunctivae are normal.  Cardiovascular: Normal rate, regular rhythm  and normal heart sounds.  Respiratory: Effort normal and breath sounds normal. No respiratory distress. He has no wheezes.  GI: Soft. Bowel sounds are normal. He exhibits distension. There is no abdominal tenderness.  Musculoskeletal:        General: No edema (no pitting).  Neurological: He is alert and oriented to person, place, and time.  Skin: No rash noted. He is not diaphoretic.  Psychiatric: He has a normal mood and affect. His behavior is normal. Judgment and thought content normal.   Assessment & Plan:   See Encounters Tab for problem based charting.   Patient discussed with Dr. Lynnae January

## 2019-02-13 NOTE — BH Specialist Note (Signed)
Integrated Behavioral Health Initial Visit  MRN: [id number] Name: Terry Parks  Number of [place] Clinician visits:: 1/6 Session Start time: 2:10  Session End time: 2:45 Total time: 35 minutes  Type of Service: [place] Interpretor:No.         SUBJECTIVE: Terry Parks is a 63 y.o. male  whom attended his session individually.  Patient was referred by Dr. Maricela Bo for depression. Patient reports the following symptoms/concerns: depression, lack of appetite, sleep issues, chronic health concerns, and life stressors.  Duration of problem: around 4 years; Severity of problem: mild  OBJECTIVE: Mood: Netural and Affect: Appropriate Risk of harm to self or others: No plan to harm self or others  LIFE CONTEXT: Family and Social: Patient lives alone.  School/Work: Patient reported he has not worked in four years due to health issues. Patient feels he cannot work due to his frequent need to urinate and inability to stand for long periods of time. Patient worries frequently about his limited income. Patient is currently in graduate school obtaining his masters in [place].  Self-Care: Needs improvement.   GOALS ADDRESSED: Patient will: 1. Reduce symptoms of: depression, insomnia and stress 2. Increase knowledge and/or ability of: coping skills, healthy habits and self-management skills  3. Demonstrate ability to: Increase healthy adjustment to current life circumstances, Increase adequate support systems for patient/family, Increase motivation to adhere to plan of care and Improve medication compliance  INTERVENTIONS: Interventions utilized: Motivational Interviewing, Mindfulness or Relaxation Training, Brief CBT and Supportive Counseling  Standardized Assessments completed: assessed for SI, HI, and self-harm.  ASSESSMENT: Patient currently experiencing mild depression for the past several years. Patient reported around four  years ago he began have life changing health issues. Patient is frustrated that he is on so many medications, and recently stopped taking almost all of his medications. Patient reported he does not sleep well due to having sleep apnea, and feels tired often. Patient processed his process with applying for disability, and identified how this has contributed to his stress levels. Patient is frustrated with disability, and the process. Patient has low appetite at times, fatigue, crying spells, and issues with concentration.    Patient may benefit from counseling.  PLAN: 1. Follow up with behavioral health clinician on : two weeks.   Dessie Coma, [facility], [place]

## 2019-02-13 NOTE — Assessment & Plan Note (Signed)
Patient's blood glucose has ranged between 100-180s. His last a1c was 7.0. Patient has not been taking metformin 1000mg  bid over the past 1 week.   -spoke to patient about needing to take both xultophy 50u qd and metformin 1000mg  bid

## 2019-02-13 NOTE — Assessment & Plan Note (Signed)
The patient's blood pressure during this visit was 146/98. The patient is currently taking spironolactone 50mg  qd, losartan 100mg  qd, imdur 30mg  qd, carvedilol 25mg  bid. His last blood pressure visits are   BP Readings from Last 3 Encounters:  02/13/19 (!) 164/85  01/31/19 (!) 152/83  01/11/19 (!) 151/91   The patient does not report palpitations, dizziness, chest pain, sob.  Assessment and Plan The patient's blood pressure is improved since last visit, but continues to not be at goal. The patient states that he stopped taking all of his anti-hypertensive medication last week as he thought that they are causing his "mind fog".   Counseled patient on the importance of remaining adherent to his heart failure medication as he has heart failure.

## 2019-02-13 NOTE — Assessment & Plan Note (Signed)
Patient is not able to use his cpap machine due to teeth and gum issues. Earliest dentist appointment is in December.

## 2019-02-13 NOTE — Telephone Encounter (Signed)
Patient was contacted due to a referral that I received from this doctor (1st attempt). Patient did not answer, and a vm was left for the patient.

## 2019-02-13 NOTE — Patient Instructions (Signed)
It was a pleasure to see you today Terry Parks. Please make the following changes:  -stop taking iron (ferrous sulfate)  -please get your cardiac echo done  -continue the amazing job losing weight! -follow up in 1 month  If you have any questions or concerns, please call our clinic at [phone] between 9am-5pm and after hours call [phone] and ask for the internal medicine resident on call. If you feel you are having a medical emergency please call 911.   Thank you, we look forward to help you remain healthy!  [name], MD Internal Medicine PGY3

## 2019-02-16 MED FILL — ACCU-CHEK AVIVA PLUS STRP: 33 days supply | Qty: 100 | Fill #1

## 2019-02-16 MED FILL — XULTOPHY 100 UNIT-3.6MG/ML: 100-3.6 | 30 days supply | Qty: 15 | Fill #2

## 2019-02-17 NOTE — Progress Notes (Signed)
Internal Medicine Clinic Attending  Case discussed with Dr. [name] at the time of the visit.  We reviewed the resident's history and exam and pertinent patient test results.  I agree with the assessment, diagnosis, and plan of care documented in the resident's note. 

## 2019-02-20 ENCOUNTER — Ambulatory Visit: Payer: Medicaid Other | Admitting: Dietician

## 2019-02-20 ENCOUNTER — Telehealth: Payer: Self-pay | Admitting: Dietician

## 2019-02-20 NOTE — Telephone Encounter (Signed)
Not able to meet now for his appointment.  He said he will call later this afternoon instead.

## 2019-02-22 ENCOUNTER — Telehealth: Payer: Self-pay | Admitting: Dietician

## 2019-02-22 DIAGNOSIS — K0889 Other specified disorders of teeth and supporting structures: Secondary | ICD-10-CM

## 2019-02-22 NOTE — Addendum Note (Signed)
Addended by: [name] on: 02/22/2019 07:00 PM   Modules accepted: Orders

## 2019-02-22 NOTE — Telephone Encounter (Signed)
  Terry Parks is a 63 y.o. male who was contacted on behalf of [facility] nutrition and diabetes services. This is a follow up call for diabetes self management:  His main concern is seeing a dentist so he can wear his CPAP when he sleeps. His reports his sleep is very poor currently.  He is also concerned about something to relive his neuropathic pain.   Diet:  He reports "No appetite" that is sometimes good and sometimes "not good".  He reports ~ 2 glasses of soda per day, followed each by a glass of water, yesterday he ate a salad and 2 sandwiches (bacon, cheese, egg and steak)and some juice. He feels he is eating too much cheese because " I know I am" he says he snacks on cheese frequently.   We discussed alternative snacks like yogurt, fruit, peanut butter/banana. He eats  Fruits- couple times a week Veggies- couple times a week Whole grains- not often peanut butter/beans once a week He says he was told to stop iron and Vitamin D, but does take B12 daily  Monitoring:  Are you checking blood sugars at home?    Every morning What are the results?128/150/139  When he was off his metformin for the 3 weeks they were mostly over 150, 300 one time symptoms of high- no    Medications:  Looking forward to an appointment with Terry Parks.  Reports that he had stopped all his medications except insulin and fluid pills for 3 weeks. He started back on all of his medications 2 days ago. He says his blood sugars were higher, but otherwise he could not tell a difference in hopw h felt.  He did not want to discuss the otherwise, but feels he needs "something other than another pill". Do you need any meds refilled? no  Exercise:  What type of activity are you doing? Walks 1-2x/week; tennis ball thing on the feet every now and then   How often- how many days a week and how many minutes each time  Sleep: Would like Terry Parks to refer him to [facility] to the dentist, not sleeping What time do you wake up? 6-7  am, but sleepy until 10-11 am, naps What time do you go to sleep? 2 am- no cpap  How often to wake up? 7 or more times at least   Lab Results  Component Value Date   HGBA1C 7.4 (A) 01/31/2019   HGBA1C 10.3 (A) 10/13/2018   HGBA1C >14.0 (A) 07/07/2018    Wt Readings from Last 5 Encounters:  02/13/19 (!) 318 lb 14.4 oz (144.7 kg)  01/31/19 (!) 329 lb 12.8 oz (149.6 kg)  01/11/19 (!) 321 lb (145.6 kg)  12/26/18 (!) 321 lb 12.8 oz (146 kg)  11/13/18 (!) 311 lb (141.1 kg)   Plan:  Supportive care to assist Terry Parks in removing barriers to self care.  Date of follow-up visit scheduled:03-23-19 Any other questions or concerns?" no  Terry Parks, RD 02/22/2019 10:54 AM.

## 2019-02-23 ENCOUNTER — Ambulatory Visit (HOSPITAL_COMMUNITY)
Admission: RE | Admit: 2019-02-23 | Discharge: 2019-02-23 | Disposition: A | Discharge status: Home / Self Care | Payer: Medicaid Other | Source: Ambulatory Visit | Attending: Internal Medicine | Admitting: Internal Medicine

## 2019-02-23 ENCOUNTER — Encounter: Payer: Self-pay | Admitting: Internal Medicine

## 2019-02-23 ENCOUNTER — Other Ambulatory Visit: Payer: Self-pay

## 2019-02-23 DIAGNOSIS — I5042 Chronic combined systolic (congestive) and diastolic (congestive) heart failure: Secondary | ICD-10-CM | POA: Diagnosis not present

## 2019-02-23 MED ORDER — PERFLUTREN LIPID MICROSPHERE
1.0000 mL | INTRAVENOUS | Status: AC | PRN
  Administered 2019-02-23: 12:00:00 5 mL via INTRAVENOUS
  Filled 2019-02-23: qty 10

## 2019-02-23 NOTE — Progress Notes (Signed)
Echocardiogram 2D Echocardiogram has been performed.  Terry Parks 02/23/2019, 11:42 AM

## 2019-03-06 ENCOUNTER — Other Ambulatory Visit: Payer: Self-pay

## 2019-03-06 ENCOUNTER — Ambulatory Visit (INDEPENDENT_AMBULATORY_CARE_PROVIDER_SITE_OTHER): Payer: Medicaid Other | Admitting: Internal Medicine

## 2019-03-06 DIAGNOSIS — E1142 Type 2 diabetes mellitus with diabetic polyneuropathy: Secondary | ICD-10-CM

## 2019-03-06 DIAGNOSIS — I5042 Chronic combined systolic (congestive) and diastolic (congestive) heart failure: Secondary | ICD-10-CM

## 2019-03-06 DIAGNOSIS — Z79899 Other long term (current) drug therapy: Secondary | ICD-10-CM | POA: Diagnosis not present

## 2019-03-06 DIAGNOSIS — I480 Paroxysmal atrial fibrillation: Secondary | ICD-10-CM

## 2019-03-06 DIAGNOSIS — E1140 Type 2 diabetes mellitus with diabetic neuropathy, unspecified: Secondary | ICD-10-CM | POA: Diagnosis not present

## 2019-03-06 DIAGNOSIS — Z7901 Long term (current) use of anticoagulants: Secondary | ICD-10-CM

## 2019-03-06 DIAGNOSIS — I4819 Other persistent atrial fibrillation: Secondary | ICD-10-CM

## 2019-03-06 NOTE — Patient Instructions (Signed)
It was a pleasure to see you today Mr. Spielberg. Please make the following changes:  -please make sure to call and schedule appointment with [facility] immediately so that you can resume cpap -you have been referred to physical therapy for tens treatment  -I have ordered for a home health aide to assist you at home   If you have any questions or concerns, please call our clinic at [phone] between 9am-5pm and after hours call [phone] and ask for the internal medicine resident on call. If you feel you are having a medical emergency please call 911.   Thank you, we look forward to help you remain healthy!  [name], MD Internal Medicine PGY3

## 2019-03-06 NOTE — Progress Notes (Signed)
CC: Diabetic neuropathy follow up  HPI:  [name] is a 63 y.o. with combined systolic and diastolic heart failure, paroxysmal atrial fibrillation, diabetes mellitus type 2 with neuropathy who presents for diabetic neuropathy follow up. Please see problem based charting for evaluation, assessment, and plan.  Past Medical History:  Diagnosis Date  . Abscessed tooth    top back large cavity no pain or drainage, one on bottom  pt pulled tooth 4-5 months ago, right top large hole in tooth  . AKI (acute kidney injury) ([place])   . Allergic rhinitis   . Anemia   . Anxiety   . Asthma   . Atrial fibrillation ([place])   . BPH (benign prostatic hypertrophy)    Massive BPH noted on cystoscopy 1/23/ 2012 by Dr. Risa Grill.  . Cancer [facility])    prostate cancer 2019  . Cardiomyopathy ([place])   . CHF (congestive heart failure) ([place])   . Cough 03/30/2012  . Depression   . Diabetes mellitus 04/08/2008   type 2  . Foley catheter in place 07-05-17 placed  . Headache(784.0)    hx migraines none recent  . Hyperlipemia   . Hypertension   . Hypertensive cardiopathy 03/01/2006   2-D echocardiogram 02/01/2012 showed moderate LVH, mildly to moderately reduced left ventricular systolic function with an estimated ejection fraction of 40-45%, and diffuse hypokinesis.  A nuclear medicine stress study done 01/31/2012 showed no reversible ischemia, a small mid anterior wall fixed defect/infarct, and ejection fraction 42%.      . Neck pain   . Nephrolithiasis 05/29/2010   CT scan of abdomen/pelvis on 05/29/2010 showed an obstructing approximate 1-2 mm calculus at the left UVJ, and an approximate 1-2 mm left lower pole renal calculus.   Patient had continuing severe pain , and an elevation of his serum creatinine to a value of 1.75 on 06/06/2010.  Patient underwent cystoscopy on 06/08/2010 by Dr. Risa Grill, but attempts at retrograde pyelogram and ureteroscopy were unsucc  . Numbness 01/08/2018  . Obstructive sleep apnea  03/06/2008   Sleep study 03/06/08 showed severe OSA/hypopnea syndrome, with successful CPAP titration to 13 CWP using a medium ResMed Mirage Quattro full face mask with heated humidifier.   . Rash 04/17/2014  . Renal calculus 05/29/2010   CT scan of abdomen/pelvis on 05/29/2010 showed an obstructing approximate 1-2 mm calculus at the left UVJ, and an approximate 1-2 mm left lower pole renal calculus.   Patient had continuing severe pain , and an elevation of his serum creatinine to a value of 1.75 on 06/06/2010.  The stone had apparently passed and was not seen on repeat CT 06/08/2010.  . Sleep apnea    haven't use cpap in 2 years  . Tooth pain 10/29/2017  . Urinary straining 11/02/2016   Review of Systems:    Review of Systems  Constitutional: Negative for chills and fever.  Respiratory: Negative for cough and shortness of breath.   Cardiovascular: Negative for chest pain.  Gastrointestinal: Negative for nausea and vomiting.  Neurological:       Numbness and tingling   Physical Exam:  Vitals:   03/06/19 1603  BP: 134/79  Pulse: 88  Weight: (!) 313 lb (142 kg)   Physical Exam  Constitutional: Appears well-developed and well-nourished. No distress. Obese body habitus HENT:  Head: Normocephalic and atraumatic.  Eyes: Conjunctivae are normal.  Cardiovascular: Normal rate, regular rhythm and normal heart sounds.  Respiratory: Effort normal and breath sounds normal. No respiratory distress. No wheezes.  GI: Soft. Bowel sounds are normal. No distension. There is no tenderness. Protuberant abdomen Musculoskeletal: No edema.  Neurological: Is alert.  Skin: Not diaphoretic. No erythema. No pitting edema.  Psychiatric: Normal mood and affect. Behavior is normal. Judgment and thought content normal.    Assessment & Plan:   See Encounters Tab for problem based charting.  Patient discussed with Dr. Heber Lanier

## 2019-03-07 NOTE — Assessment & Plan Note (Signed)
The patient is currently taking xarelto 20mg  qd, carvedilol 25mg  bid, amiodarone 200mg  qd.   Assessment and plan  Continue current therapy. The patient will need lfts and tsh checked march 2021

## 2019-03-07 NOTE — Assessment & Plan Note (Signed)
  Patient had a TTE done 02/23/19 which showed ef 35-40%, decrease in left ventricular function, moderate left ventricular hypertrophy, blobal left ventricular hypokinesis, impaired relaxation pattern of lv diastolic filling.   The patient is currently not volume overloaded. He appears comfortable.   Assessment and plan  Patient is on optimal medical therapy, but continues to have poor ef and diastolic dysfunction. He has poor adherence with cpap due to poor dentition. I once again provided the patient with a list of [facility] providers that he can get care with asap so that he can use his cpap mask without pain.   -patient is to follow with cardiology to discuss possible need for icd

## 2019-03-07 NOTE — Assessment & Plan Note (Signed)
The patient states that he has been having worsening bilateral leg tightness, numbness and tingling.   He is currently being treated for diabetic neuropathy with gabapentin 300mg  bid and cymbalta 60mg  qd.   Assessment and plan  Discussed increasing gabapentin dosage or adding on lyrica, but the patient refused any additional medication. He requested for TENS therapy. Advised patient that there is not much evidence supporting TENS, but stated that he still wanted to try it.   -Referral for physical therapy to do TENS therapy

## 2019-03-08 ENCOUNTER — Telehealth: Payer: Self-pay | Admitting: Dietician

## 2019-03-08 ENCOUNTER — Ambulatory Visit (INDEPENDENT_AMBULATORY_CARE_PROVIDER_SITE_OTHER): Payer: Medicaid Other | Admitting: Gastroenterology

## 2019-03-08 ENCOUNTER — Other Ambulatory Visit: Payer: Self-pay

## 2019-03-08 ENCOUNTER — Encounter: Payer: Self-pay | Admitting: Gastroenterology

## 2019-03-08 ENCOUNTER — Other Ambulatory Visit (INDEPENDENT_AMBULATORY_CARE_PROVIDER_SITE_OTHER): Payer: Medicaid Other

## 2019-03-08 VITALS — BP 140/84 | HR 92 | Temp 97.8°F | Ht 73.0 in | Wt 311.0 lb

## 2019-03-08 DIAGNOSIS — K5900 Constipation, unspecified: Secondary | ICD-10-CM

## 2019-03-08 DIAGNOSIS — R109 Unspecified abdominal pain: Secondary | ICD-10-CM

## 2019-03-08 DIAGNOSIS — Z8719 Personal history of other diseases of the digestive system: Secondary | ICD-10-CM

## 2019-03-08 DIAGNOSIS — D508 Other iron deficiency anemias: Secondary | ICD-10-CM

## 2019-03-08 DIAGNOSIS — E611 Iron deficiency: Secondary | ICD-10-CM

## 2019-03-08 DIAGNOSIS — D509 Iron deficiency anemia, unspecified: Secondary | ICD-10-CM | POA: Diagnosis not present

## 2019-03-08 LAB — CBC WITH DIFFERENTIAL/PLATELET
Basophils Absolute: 0.1 10*3/uL (ref 0.0–0.1)
Basophils Relative: 0.9 % (ref 0.0–3.0)
Eosinophils Absolute: 0.1 10*3/uL (ref 0.0–0.7)
Eosinophils Relative: 1.3 % (ref 0.0–5.0)
HCT: 42.1 % (ref 39.0–52.0)
Hemoglobin: 14 g/dL (ref 13.0–17.0)
Lymphocytes Relative: 25.9 % (ref 12.0–46.0)
Lymphs Abs: 1.9 10*3/uL (ref 0.7–4.0)
MCHC: 33.3 g/dL (ref 30.0–36.0)
MCV: 83.9 fl (ref 78.0–100.0)
Monocytes Absolute: 0.7 10*3/uL (ref 0.1–1.0)
Monocytes Relative: 9.2 % (ref 3.0–12.0)
Neutro Abs: 4.5 10*3/uL (ref 1.4–7.7)
Neutrophils Relative %: 62.7 % (ref 43.0–77.0)
Platelets: 174 10*3/uL (ref 150.0–400.0)
RBC: 5.02 Mil/uL (ref 4.22–5.81)
RDW: 14.6 % (ref 11.5–15.5)
WBC: 7.2 10*3/uL (ref 4.0–10.5)

## 2019-03-08 LAB — PROTIME-INR
INR: 1.2 ratio — ABNORMAL HIGH (ref 0.8–1.0)
Prothrombin Time: 13.7 s — ABNORMAL HIGH (ref 9.6–13.1)

## 2019-03-08 LAB — IBC + FERRITIN
Ferritin: 222.2 ng/mL (ref 22.0–322.0)
Iron: 94 ug/dL (ref 42–165)
Saturation Ratios: 29.8 % (ref 20.0–50.0)
Transferrin: 225 mg/dL (ref 212.0–360.0)

## 2019-03-08 NOTE — Progress Notes (Signed)
[place] VISIT   Primary Care Provider [name], MD [place] [id number] [phone]  Patient Profile: Terry Parks is a 63 y.o. male with a pmh significant for Afib (on Xarelto), HTN, HLD, OSA, BPH, MDD, Nephrolithiasis, DM, HFrEF, GERD, esophagitis, IDA (improving).  The patient presents to the [facility] for an evaluation and management of problem(s) noted below:  Problem List 1. Iron deficiency anemia, unspecified iron deficiency anemia type   2. Hx of esophagitis   3. Abdominal discomfort   4. Constipation, unspecified constipation type     History of Present Illness: Please see initial consultation note and progress notes for full details of HPI.  Interval History The patient returns for scheduled follow-up.  He has done well overall.  He feels well and actually stopped taking his oral iron within the last couple of weeks.  GERD is well controlled.  He denies any dysphagia or odynophagia.  No significant abdominal pain.  No change in bowel habits and his stools are less dark since he has stopped iron.  He is not sure if he wants to have another endoscopy at this time.  He has mild constipation but is not significantly straining.  He is not using laxatives or fiber other than when he gets in his diet.  GI Review of Systems Positive as above Negative for odynophagia, nausea, vomiting, pain  Review of Systems General: Denies fevers/chills Cardiovascular: Denies chest pain Pulmonary: Denies significant shortness of breath Gastroenterological: See HPI Genitourinary: Denies darkened urine or hematuria Hematological: Bruising/bleeding stable due to anticoagulation Dermatological: Denies jaundice Psychological: Mood is stable   Medications Current Outpatient Medications  Medication Sig Dispense Refill  . Accu-Chek Softclix Lancets lancets Check blood sugar three times a day as instructed 300 each 3  .  acetaminophen (TYLENOL) 325 MG tablet Take 325-650 mg by mouth every 6 (six) hours as needed (for pain).    [name] amiodarone (PACERONE) 200 MG tablet Take 1 tablet (200 mg total) by mouth daily. 90 tablet 1  . amLODipine-atorvastatin (CADUET) 5-40 MG tablet Take 1 tablet by mouth daily. 90 tablet 1  . bisacodyl (DULCOLAX) 5 MG EC tablet Take 5 mg by mouth daily as needed for moderate constipation.    . Blood Glucose Monitoring Suppl (ACCU-CHEK AVIVA) device Use as instructed 1 each 0  . cholecalciferol (VITAMIN D3) 25 MCG (1000 UT) tablet Take 1 tablet (1,000 Units total) by mouth daily. 30 tablet 11  . DULoxetine (CYMBALTA) 60 MG capsule Take 1 capsule (60 mg total) by mouth daily. 90 capsule 3  . gabapentin (NEURONTIN) 300 MG capsule Take 1 capsule (300 mg total) by mouth 2 (two) times daily. 60 capsule 3  . 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 insulin dependent 300 each 11  . Insulin Degludec-Liraglutide (XULTOPHY) 100-3.6 UNIT-MG/ML SOPN Inject 50 Units into the skin daily. 5 pen 11  . Insulin Pen Needle (CARETOUCH PEN NEEDLES) 31G X 6 MM MISC 1 pen by Does not apply route at bedtime. 100 each 3  . isosorbide mononitrate (IMDUR) 30 MG 24 hr tablet Take 1 tablet (30 mg total) by mouth daily. 90 tablet 3  . losartan (COZAAR) 100 MG tablet Take 1 tablet (100 mg total) by mouth daily. 90 tablet 0  . nystatin cream (MYCOSTATIN) Apply 1 application topically See admin instructions. Apply to arms and legs 2 times a day 30 g 1  .  omeprazole (PRILOSEC) 40 MG capsule Take 1 capsule (40 mg total) by mouth 2 (two) times daily before a meal. 60 capsule 3  . rivaroxaban (XARELTO) 20 MG TABS tablet Take 1 tablet (20 mg total) by mouth daily with breakfast. 90 tablet 0  . spironolactone (ALDACTONE) 50 MG tablet Take 1 tablet (50 mg total) by mouth daily. 90 tablet 1  . torsemide (DEMADEX) 20 MG tablet Take 2.5 tablets (50 mg total) by mouth 2 (two)  times daily. 120 tablet 2  . carvedilol (COREG) 25 MG tablet Take 1 tablet (25 mg total) by mouth 2 (two) times daily with a meal. 180 tablet 1  . metformin (FORTAMET) 1000 MG (OSM) 24 hr tablet Take 2 tablets (2,000 mg total) by mouth daily with breakfast. 180 tablet 1   No current facility-administered medications for this visit.     Allergies Allergies  Allergen Reactions  . Lisinopril Cough    Histories Past Medical History:  Diagnosis Date  . Abscessed tooth    top back large cavity no pain or drainage, one on bottom  pt pulled tooth 4-5 months ago, right top large hole in tooth  . AKI (acute kidney injury) ([place])   . Allergic rhinitis   . Anemia   . Anxiety   . Asthma   . Atrial fibrillation ([place])   . BPH (benign prostatic hypertrophy)    Massive BPH noted on cystoscopy 1/23/ 2012 by Dr. Risa Grill.  . Cancer [facility])    prostate cancer 2019  . Cardiomyopathy ([place])   . CHF (congestive heart failure) ([place])   . Cough 03/30/2012  . Depression   . Diabetes mellitus 04/08/2008   type 2  . Foley catheter in place 07-05-17 placed  . Headache(784.0)    hx migraines none recent  . Hyperlipemia   . Hypertension   . Hypertensive cardiopathy 03/01/2006   2-D echocardiogram 02/01/2012 showed moderate LVH, mildly to moderately reduced left ventricular systolic function with an estimated ejection fraction of 40-45%, and diffuse hypokinesis.  A nuclear medicine stress study done 01/31/2012 showed no reversible ischemia, a small mid anterior wall fixed defect/infarct, and ejection fraction 42%.      . Neck pain   . Nephrolithiasis 05/29/2010   CT scan of abdomen/pelvis on 05/29/2010 showed an obstructing approximate 1-2 mm calculus at the left UVJ, and an approximate 1-2 mm left lower pole renal calculus.   Patient had continuing severe pain , and an elevation of his serum creatinine to a value of 1.75 on 06/06/2010.  Patient underwent cystoscopy on 06/08/2010 by Dr. Risa Grill, but attempts at  retrograde pyelogram and ureteroscopy were unsucc  . Numbness 01/08/2018  . Obstructive sleep apnea 03/06/2008   Sleep study 03/06/08 showed severe OSA/hypopnea syndrome, with successful CPAP titration to 13 CWP using a medium ResMed Mirage Quattro full face mask with heated humidifier.   . Rash 04/17/2014  . Renal calculus 05/29/2010   CT scan of abdomen/pelvis on 05/29/2010 showed an obstructing approximate 1-2 mm calculus at the left UVJ, and an approximate 1-2 mm left lower pole renal calculus.   Patient had continuing severe pain , and an elevation of his serum creatinine to a value of 1.75 on 06/06/2010.  The stone had apparently passed and was not seen on repeat CT 06/08/2010.  . Sleep apnea    haven't use cpap in 2 years  . Tooth pain 10/29/2017  . Urinary straining 11/02/2016   Past Surgical History:  Procedure Laterality Date  . big  toe nails removed Bilateral 20 yrs ago  . CARDIOVERSION N/A 06/08/2017   Procedure: CARDIOVERSION;  Surgeon: Josue Hector, MD;  Location: [name];  Service: Cardiovascular;  Laterality: N/A;  . COLONOSCOPY    . CYSTOSCOPY W/ RETROGRADES    . IR RADIOLOGIST EVAL & MGMT  02/02/2018  . LYMPHADENECTOMY Bilateral 10/06/2017   Procedure: LYMPHADENECTOMY;  Surgeon: Lucas Mallow, MD;  Location: [facility] ORS;  Service: Urology;  Laterality: Bilateral;  . ROBOT ASSISTED LAPAROSCOPIC RADICAL PROSTATECTOMY N/A 10/06/2017   Procedure: XI ROBOTIC ASSISTED LAPAROSCOPIC RADICAL PROSTATECTOMY;  Surgeon: Lucas Mallow, MD;  Location: [facility] ORS;  Service: Urology;  Laterality: N/A;  . TRANSURETHRAL RESECTION OF BLADDER TUMOR N/A 07/13/2017   Procedure: TRANSURETHRAL RESECTION OF PROSTATE;  Surgeon: Lucas Mallow, MD;  Location: [facility] ORS;  Service: Urology;  Laterality: N/A;   Social History   Socioeconomic History  . Marital status: Single    Spouse name: Not on file  . Number of children: 4  . Years of education: 29  . Highest education level: Not on file   Occupational History  . Occupation:        Employer: UNEMPLOYED  Social Needs  . Financial resource strain: Not on file  . Food insecurity    Worry: Not on file    Inability: Not on file  . Transportation needs    Medical: Not on file    Non-medical: Not on file  Tobacco Use  . Smoking status: Never Smoker  . Smokeless tobacco: Never Used  Substance and Sexual Activity  . Alcohol use: No    Alcohol/week: 0.0 standard drinks  . Drug use: No  . Sexual activity: Not Currently  Lifestyle  . Physical activity    Days per week: Not on file    Minutes per session: Not on file  . Stress: Not on file  Relationships  . Social Herbalist on phone: Not on file    Gets together: Not on file    Attends religious service: Not on file    Active member of club or organization: Not on file    Attends meetings of clubs or organizations: Not on file    Relationship status: Not on file  . Intimate partner violence    Fear of current or ex partner: Not on file    Emotionally abused: Not on file    Physically abused: Not on file    Forced sexual activity: Not on file  Other Topics Concern  . Not on file  Social History Narrative   Divorced, 4 children, lives alone.     Family History  Problem Relation Age of Onset  . Breast cancer Mother   . Hypertension Father   . Diabetes Maternal Grandmother   . Colon cancer Neg Hx   . Prostate cancer Neg Hx   . Heart attack Neg Hx   . Esophageal cancer Neg Hx   . Inflammatory bowel disease Neg Hx   . Liver disease Neg Hx   . Pancreatic cancer Neg Hx   . Rectal cancer Neg Hx   . Stomach cancer Neg Hx    I have reviewed his medical, social, and family history in detail and updated the electronic medical record as necessary.    PHYSICAL EXAMINATION  BP 140/84   Pulse 92   Temp 97.8 F (36.6 C)   Ht 6\' 1"  (1.854 m)   Wt (!) 311 lb (141.1 kg)  BMI 41.03 kg/m  GEN: NAD, appears stated age, doesn't appear chronically ill PSYCH:  Cooperative, without pressured speech EYE: Conjunctivae pink, sclerae anicteric ENT: MMM CV: Nontachycardic without rubs or gallops RESP: CTAB posteriorly, without wheezing GI: NABS, soft, obese, rounded, ventral diastases, nontender, unable to appreciate hepatosplenomegaly  MSK/EXT: Trace bilateral lower extremity edema SKIN: No jaundice NEURO:  Alert & Oriented x 3, no focal deficits   REVIEW OF DATA  I reviewed the following data at the time of this encounter:  GI Procedures and Studies  August 2020 colonoscopy - Hemorrhoids found on digital rectal exam. - Stool in the entire examined colon. Lavage allowed adequate visualization. - Normal mucosa in the entire examined colon. - Non-bleeding non-thrombosed internal hemorrhoids.  August 2020 EGD - No gross lesions in proximal/middle esophagus. Distal esophagitis has healed. - Salmon-colored mucosa suspicious for short-segment Barrett's esophagus. Biopsied. - Hiatal hernia. - Congested, erythematous, flattened, friable (with spontaneous bleeding) and texture changed mucosa in the greater curvature of the gastric body. Biopsied to rule out dysplasia. - No other gross lesions in the stomach. Biopsied for HP. - No gross lesions in the duodenal bulb, in the first portion of the duodenum and in the second portion of the duodenum. Diagnosis 1. Surgical [P], distal gastric body - GASTRIC MUCOSA WITH EROSION AND REFRACTILE FOREIGN MATERIAL CONSISTENT WITH MEDICATION RELATED GASTROPATHY. [place] NEGATIVE FOR HELICOBACTER PYLORI. - NO INTESTINAL METAPLASIA, DYSPLASIA OR MALIGNANCY. 2. Surgical [P], random gastric BXs - ANTRAL AND OXYNTIC MUCOSA WITH SLIGHT CHRONIC INFLAMMATION AND MILDLY ENLARGED LYMPHOID AGGREGATE. - WARTHIN-STARRY NEGATIVE FOR HELICOBACTER PYLORI. - NO INTESTINAL METAPLASIA, DYSPLASIA OR CARCINOMA. 3. Surgical [P], distal esophagus - GASTROESOPHAGEAL MUCOSA WITH INFLAMMATION CONSISTENT WITH REFLUX. - NO  INTESTINAL METAPLASIA, DYSPLASIA OR MALIGNANCY.  Laboratory Studies  Reviewed in epic  Imaging Studies  No new imaging studies to review   ASSESSMENT  Mr. Cuvelier is a 63 y.o. male with a pmh significant for Afib (on Xarelto), HTN, HLD, OSA, BPH, MDD, Nephrolithiasis, DM, HFrEF, GERD, esophagitis, IDA (improving).  The patient is seen today for evaluation and management of:  1. Iron deficiency anemia, unspecified iron deficiency anemia type   2. Hx of esophagitis   3. Abdominal discomfort   4. Constipation, unspecified constipation type    Overall, the patient is hemodynamically and clinically stable.  I am interested in how his iron indices look at this point in time.  No significant evidence of a source for iron deficiency.  He may require a video capsule endoscopy at some point in the future.  He has stopped his oral iron.  His iron intake has decreased and he is now redeveloped iron deficiency will need to be restarted on oral iron given IV iron infusions.  I would see how he does with recurrent iron being supplemented before we do video capsule endoscopy or enteroscopy.  The patient's GERD symptoms are well controlled on his current PPI dosing.  The patient was educated on a high-fiber diet as well as on fiber supplementation.  We will see how he does if he continues to have issues we will consider the use of laxative therapy.  All patient questions were answered, to the best of my ability, and the patient agrees to the aforementioned plan of action with follow-up as indicated.   PLAN  Laboratories as outlined below If recurrent iron deficiency then he needs to restart oral iron and be given 1 more IV iron infusion and then have labs drawn 4 to 6 weeks  after his iron infusion If he continues to have iron deficiency then will proceed with enteroscopy plus/minus video capsule endoscopy If no evidence of iron deficiency will hold on repeat endoscopic evaluation High-fiber diet FiberCon or  fiber supplement 1-2 times daily Follow-up based on laboratories   Orders Placed This Encounter  Procedures  . CBC  . IBC + Ferritin    New Prescriptions   No medications on file   Modified Medications   No medications on file    Planned Follow Up: Return in about 4 months (around 07/09/2019).   [name], MD [facility] Office # [phone]

## 2019-03-08 NOTE — Addendum Note (Signed)
Addended by: Hulan Fray on: 03/08/2019 04:12 PM   Modules accepted: Orders

## 2019-03-08 NOTE — Telephone Encounter (Signed)
we spoke about it at length at previous visit. That is not a problem we will work on other strategies. Thank you

## 2019-03-08 NOTE — Telephone Encounter (Signed)
I spoke to Terry Parks about the Medical weight management referral and the fees involved. He was not aware of the referral and was not interested in the referral at this time.

## 2019-03-08 NOTE — Patient Instructions (Signed)
Your provider has requested that you go to the basement level for lab work before leaving today. Press "B" on the elevator. The lab is located at the first door on the left as you exit the elevator.  Start fibercon or fiber choice once daily- over the counter.    If you are age 61 or younger, your body mass index should be between 19-25. Your Body mass index is 41.03 kg/m. If this is out of the aformentioned range listed, please consider follow up with your Primary Care Provider.    You will need to return to office in 4 months if labs are stable.   Thank you for choosing me and [facility].  Dr. Rush Landmark

## 2019-03-09 ENCOUNTER — Encounter: Payer: Self-pay | Admitting: Gastroenterology

## 2019-03-09 DIAGNOSIS — R109 Unspecified abdominal pain: Secondary | ICD-10-CM | POA: Insufficient documentation

## 2019-03-09 DIAGNOSIS — K5900 Constipation, unspecified: Secondary | ICD-10-CM | POA: Insufficient documentation

## 2019-03-09 NOTE — Addendum Note (Signed)
Addended by: Hulan Fray on: 03/09/2019 01:34 PM   Modules accepted: Orders

## 2019-03-11 ENCOUNTER — Encounter: Payer: Self-pay | Admitting: Gastroenterology

## 2019-03-11 NOTE — Progress Notes (Signed)
Internal Medicine Clinic Attending  Case discussed with Dr. [name] at the time of the visit.  We reviewed the resident's history and exam and pertinent patient test results.  I agree with the assessment, diagnosis, and plan of care documented in the resident's note. 

## 2019-03-14 ENCOUNTER — Telehealth: Payer: Self-pay | Admitting: *Deleted

## 2019-03-14 MED FILL — OMEPRAZOLE DR 40 MG CAPSULE: 40 | 30 days supply | Qty: 60 | Fill #0

## 2019-03-14 MED FILL — GABAPENTIN 300 MG CAPSULE: 300 | 30 days supply | Qty: 60 | Fill #1

## 2019-03-14 MED FILL — XARELTO 20 MG TABLET: 20 | 30 days supply | Qty: 30 | Fill #0

## 2019-03-14 NOTE — Telephone Encounter (Signed)
[facility] calls and states pt has not been compliant with medicine refills, states xarelto was last filled 7/8 and gabapentin 7/8. Just FYI

## 2019-03-15 ENCOUNTER — Other Ambulatory Visit: Payer: Self-pay

## 2019-03-15 DIAGNOSIS — I4819 Other persistent atrial fibrillation: Secondary | ICD-10-CM

## 2019-03-15 MED ORDER — TORSEMIDE 20 MG PO TABS: 50.0000 mg | ORAL_TABLET | Freq: Two times a day (BID) | ORAL | 1 refills | Status: DC

## 2019-03-15 MED FILL — TORSEMIDE 20 MG TABLET: 20 | 90 days supply | Qty: 450 | Fill #0

## 2019-03-16 NOTE — Telephone Encounter (Signed)
Yes, I had talked to patient at length about non-adherence to his medication. He is working on better compliance.

## 2019-03-19 ENCOUNTER — Other Ambulatory Visit: Payer: Self-pay

## 2019-03-19 DIAGNOSIS — D509 Iron deficiency anemia, unspecified: Secondary | ICD-10-CM

## 2019-03-23 ENCOUNTER — Ambulatory Visit: Payer: Medicaid Other | Admitting: Dietician

## 2019-03-23 ENCOUNTER — Telehealth: Payer: Self-pay | Admitting: Dietician

## 2019-03-23 NOTE — Telephone Encounter (Signed)
Contacted patient by phone.

## 2019-03-23 NOTE — Progress Notes (Signed)
Documentation for phone call to provide support for behavior change:  His goal is weight loss to help him decrease his need for medicine.  Call to Mr Weese to provide Blood sugar <150 before eating Reports he is taking medications as ordered:50 units at night, doesn't miss; metformin twice  Feels he is losing weight by the food changes he is making- less junk food, processed food and less sugar.  He reports eating about two meals a day.  Examples of food choices are:  ham and bologna & cheese sandwiches on whole wheat sandwich; milk cran-pineapple juice-8-16 oz, sierra mist 2x/day with meals, drinking water [employer] 4 for 4$ chili;  4 nuggets, strawberry lemonade drink, 1/2 sandwich from [employer] 5-6 PM- banana, oranges,grapes and sharp cheddar cheese before bed Veggies- greens, pot, mac cheese, beans,cabbage Sleep- he reports he is not using his Cpap and not sure how he is seeing, naps during day.  Physical activity- using ball in feet, steps in and out of his house, gets outside daily.  Plan: follow up call in 2 months per his request. Debera Lat, RD 03/23/2019 11:01 AM.

## 2019-03-27 DIAGNOSIS — R32 Unspecified urinary incontinence: Secondary | ICD-10-CM | POA: Diagnosis not present

## 2019-03-28 MED FILL — OMEPRAZOLE DR 40 MG CAPSULE: 40 | 30 days supply | Qty: 30 | Fill #2

## 2019-03-28 MED FILL — ACCU-CHEK AVIVA PLUS STRP: 33 days supply | Qty: 100 | Fill #2

## 2019-03-28 MED FILL — LOSARTAN POTASSIUM 50 MG TA: 50 | 30 days supply | Qty: 30 | Fill #3

## 2019-03-28 MED FILL — SPIRONOLACTONE 50 MG TABLET: 50 | 90 days supply | Qty: 90 | Fill #1

## 2019-03-28 MED FILL — XULTOPHY 100 UNIT-3.6MG/ML: 100-3.6 | 30 days supply | Qty: 15 | Fill #3

## 2019-04-04 ENCOUNTER — Encounter: Payer: Self-pay | Admitting: *Deleted

## 2019-04-17 ENCOUNTER — Encounter: Payer: Medicaid Other | Admitting: Internal Medicine

## 2019-04-18 DIAGNOSIS — R32 Unspecified urinary incontinence: Secondary | ICD-10-CM | POA: Diagnosis not present

## 2019-04-23 ENCOUNTER — Emergency Department (HOSPITAL_COMMUNITY): Payer: Medicaid Other

## 2019-04-23 ENCOUNTER — Emergency Department (HOSPITAL_COMMUNITY)
Admission: EM | Admit: 2019-04-23 | Discharge: 2019-04-23 | Disposition: A | Discharge status: Home / Self Care | Payer: Medicaid Other | Attending: Emergency Medicine | Admitting: Emergency Medicine

## 2019-04-23 ENCOUNTER — Ambulatory Visit (INDEPENDENT_AMBULATORY_CARE_PROVIDER_SITE_OTHER): Payer: Medicaid Other | Admitting: Internal Medicine

## 2019-04-23 ENCOUNTER — Telehealth: Payer: Self-pay | Admitting: *Deleted

## 2019-04-23 ENCOUNTER — Other Ambulatory Visit: Payer: Self-pay

## 2019-04-23 ENCOUNTER — Encounter: Payer: Self-pay | Admitting: Internal Medicine

## 2019-04-23 ENCOUNTER — Encounter (HOSPITAL_COMMUNITY): Payer: Self-pay | Admitting: Emergency Medicine

## 2019-04-23 DIAGNOSIS — M542 Cervicalgia: Secondary | ICD-10-CM | POA: Diagnosis not present

## 2019-04-23 DIAGNOSIS — Y999 Unspecified external cause status: Secondary | ICD-10-CM | POA: Insufficient documentation

## 2019-04-23 DIAGNOSIS — N183 Chronic kidney disease, stage 3 unspecified: Secondary | ICD-10-CM | POA: Insufficient documentation

## 2019-04-23 DIAGNOSIS — Z8679 Personal history of other diseases of the circulatory system: Secondary | ICD-10-CM | POA: Diagnosis not present

## 2019-04-23 DIAGNOSIS — M79642 Pain in left hand: Secondary | ICD-10-CM | POA: Diagnosis not present

## 2019-04-23 DIAGNOSIS — E1122 Type 2 diabetes mellitus with diabetic chronic kidney disease: Secondary | ICD-10-CM | POA: Insufficient documentation

## 2019-04-23 DIAGNOSIS — I5042 Chronic combined systolic (congestive) and diastolic (congestive) heart failure: Secondary | ICD-10-CM | POA: Diagnosis not present

## 2019-04-23 DIAGNOSIS — I4891 Unspecified atrial fibrillation: Secondary | ICD-10-CM | POA: Insufficient documentation

## 2019-04-23 DIAGNOSIS — S02831A Fracture of medial orbital wall, right side, initial encounter for closed fracture: Secondary | ICD-10-CM | POA: Insufficient documentation

## 2019-04-23 DIAGNOSIS — W19XXXA Unspecified fall, initial encounter: Secondary | ICD-10-CM

## 2019-04-23 DIAGNOSIS — H5461 Unqualified visual loss, right eye, normal vision left eye: Secondary | ICD-10-CM

## 2019-04-23 DIAGNOSIS — R55 Syncope and collapse: Secondary | ICD-10-CM | POA: Diagnosis not present

## 2019-04-23 DIAGNOSIS — W07XXXA Fall from chair, initial encounter: Secondary | ICD-10-CM

## 2019-04-23 DIAGNOSIS — R079 Chest pain, unspecified: Secondary | ICD-10-CM | POA: Diagnosis not present

## 2019-04-23 DIAGNOSIS — Z79899 Other long term (current) drug therapy: Secondary | ICD-10-CM | POA: Diagnosis not present

## 2019-04-23 DIAGNOSIS — J45909 Unspecified asthma, uncomplicated: Secondary | ICD-10-CM | POA: Diagnosis not present

## 2019-04-23 DIAGNOSIS — Y92018 Other place in single-family (private) house as the place of occurrence of the external cause: Secondary | ICD-10-CM | POA: Insufficient documentation

## 2019-04-23 DIAGNOSIS — S0231XA Fracture of orbital floor, right side, initial encounter for closed fracture: Secondary | ICD-10-CM | POA: Diagnosis not present

## 2019-04-23 DIAGNOSIS — Z8546 Personal history of malignant neoplasm of prostate: Secondary | ICD-10-CM | POA: Diagnosis not present

## 2019-04-23 DIAGNOSIS — S00211A Abrasion of right eyelid and periocular area, initial encounter: Secondary | ICD-10-CM

## 2019-04-23 DIAGNOSIS — Z7901 Long term (current) use of anticoagulants: Secondary | ICD-10-CM | POA: Diagnosis not present

## 2019-04-23 DIAGNOSIS — R0602 Shortness of breath: Secondary | ICD-10-CM | POA: Diagnosis not present

## 2019-04-23 DIAGNOSIS — I130 Hypertensive heart and chronic kidney disease with heart failure and stage 1 through stage 4 chronic kidney disease, or unspecified chronic kidney disease: Secondary | ICD-10-CM | POA: Diagnosis not present

## 2019-04-23 DIAGNOSIS — Z794 Long term (current) use of insulin: Secondary | ICD-10-CM | POA: Insufficient documentation

## 2019-04-23 DIAGNOSIS — S6992XA Unspecified injury of left wrist, hand and finger(s), initial encounter: Secondary | ICD-10-CM | POA: Diagnosis not present

## 2019-04-23 DIAGNOSIS — S199XXA Unspecified injury of neck, initial encounter: Secondary | ICD-10-CM | POA: Diagnosis not present

## 2019-04-23 DIAGNOSIS — Y9389 Activity, other specified: Secondary | ICD-10-CM | POA: Diagnosis not present

## 2019-04-23 DIAGNOSIS — R220 Localized swelling, mass and lump, head: Secondary | ICD-10-CM | POA: Diagnosis not present

## 2019-04-23 DIAGNOSIS — S0292XA Unspecified fracture of facial bones, initial encounter for closed fracture: Secondary | ICD-10-CM

## 2019-04-23 DIAGNOSIS — S0990XA Unspecified injury of head, initial encounter: Secondary | ICD-10-CM | POA: Diagnosis not present

## 2019-04-23 DIAGNOSIS — R519 Headache, unspecified: Secondary | ICD-10-CM | POA: Diagnosis not present

## 2019-04-23 LAB — COMPREHENSIVE METABOLIC PANEL
ALT: 24 U/L (ref 0–44)
AST: 23 U/L (ref 15–41)
Albumin: 3.9 g/dL (ref 3.5–5.0)
Alkaline Phosphatase: 73 U/L (ref 38–126)
Anion gap: 8 (ref 5–15)
BUN: 14 mg/dL (ref 8–23)
CO2: 29 mmol/L (ref 22–32)
Calcium: 8.8 mg/dL — ABNORMAL LOW (ref 8.9–10.3)
Chloride: 101 mmol/L (ref 98–111)
Creatinine, Ser: 1.75 mg/dL — ABNORMAL HIGH (ref 0.61–1.24)
GFR calc Af Amer: 48 mL/min — ABNORMAL LOW (ref >60)
GFR calc non Af Amer: 41 mL/min — ABNORMAL LOW (ref >60)
Glucose, Bld: 144 mg/dL — ABNORMAL HIGH (ref 70–99)
Potassium: 3.7 mmol/L (ref 3.5–5.1)
Sodium: 138 mmol/L (ref 135–145)
Total Bilirubin: 0.8 mg/dL (ref 0.3–1.2)
Total Protein: 8 g/dL (ref 6.5–8.1)

## 2019-04-23 LAB — CBC WITH DIFFERENTIAL/PLATELET
Abs Immature Granulocytes: 0.01 10*3/uL (ref 0.00–0.07)
Basophils Absolute: 0 10*3/uL (ref 0.0–0.1)
Basophils Relative: 0 %
Eosinophils Absolute: 0.1 10*3/uL (ref 0.0–0.5)
Eosinophils Relative: 1 %
HCT: 44.2 % (ref 39.0–52.0)
Hemoglobin: 14.2 g/dL (ref 13.0–17.0)
Immature Granulocytes: 0 %
Lymphocytes Relative: 24 %
Lymphs Abs: 2 10*3/uL (ref 0.7–4.0)
MCH: 28.6 pg (ref 26.0–34.0)
MCHC: 32.1 g/dL (ref 30.0–36.0)
MCV: 89.1 fL (ref 80.0–100.0)
Monocytes Absolute: 0.7 10*3/uL (ref 0.1–1.0)
Monocytes Relative: 9 %
Neutro Abs: 5.3 10*3/uL (ref 1.7–7.7)
Neutrophils Relative %: 66 %
Platelets: 178 10*3/uL (ref 150–400)
RBC: 4.96 MIL/uL (ref 4.22–5.81)
RDW: 13.8 % (ref 11.5–15.5)
WBC: 8 10*3/uL (ref 4.0–10.5)
nRBC: 0 % (ref 0.0–0.2)

## 2019-04-23 MED ORDER — HYDROCODONE-ACETAMINOPHEN 5-325 MG PO TABS
1.0000 | ORAL_TABLET | Freq: Once | ORAL | Status: AC
  Administered 2019-04-23: 1 via ORAL
  Filled 2019-04-23: qty 1

## 2019-04-23 MED ORDER — LATANOPROST 0.005 % OP SOLN
1.0000 [drp] | Freq: Once | OPHTHALMIC | Status: AC
  Administered 2019-04-23: 1 [drp] via OPHTHALMIC
  Filled 2019-04-23: qty 2.5

## 2019-04-23 NOTE — Progress Notes (Signed)
Patient was transported to ER via wheelchair.  Remains alert and talking. Registered and being triaged by EMT. Sander Nephew, RN 04/23/2019 2:25 PM.

## 2019-04-23 NOTE — ED Provider Notes (Signed)
MSE was initiated and I personally evaluated the patient and placed orders (if any) at  3:09 PM on April 23, 2019.  The patient appears stable so that the remainder of the MSE may be completed by another provider.  Patient presents multiple evaluation for fall.  He was at his primary care doctor's office referred him here.  On Friday he was at his computer chair and fell and the next thing he knew it was the next morning.  He takes Xarelto.  He reports that he has had multiple episodes of vomiting since this happened.  He does report that he had 1 brief episode of left-sided chest pain after he fell however that has resolved.  He has pain in his right small finger, his left hand and wrist and his right eye.  On exam his left eye is swollen.  He is able to open the eye slightly and is able to see however reports it is significantly blurry.  He denies any current pain in his chest, abdomen, or new back pain.  We discussed that this evaluation is a limited medical screening exam along with the importance of staying for full evaluation and consequences of failing to do so and he states his understanding.       Lorin Glass, PA-C 04/23/19 1521    Carmin Muskrat, MD 04/23/19 2348

## 2019-04-23 NOTE — Telephone Encounter (Signed)
Pt states fri night he was sitting in his computer chair and fell out. Injured the R eye

## 2019-04-23 NOTE — ED Notes (Signed)
Pt given crackers and ginger ale per Dr. Vanita Panda.

## 2019-04-23 NOTE — Patient Instructions (Signed)
FOLLOW-UP INSTRUCTIONS When: As needed or in 2 weeks if indicated For: Follow-up of your right eye pain What to bring: All of your medications  I have noat made any changes to your medications today.   Today we discussed your fall with the right eye injury. I have advised you to go to the ER today ASAP. I feel this eye needs to be imaged and possibly evaluated by a specialist afterwards. You should also get a BMP and EKG there given that you passed out.  As always if your symptoms worsen, fail to improve, or you develop other concerning symptoms, please notify our office or visit the local ER if we are unavailable. Symptoms including fall, fever, chills, myalgias, weakness, vision loss, chest pain should not be ignored and should encourage you to visit the ED if we are unavailable by phone or the symptoms are severe.  Thank you for your visit to the [facility] today. If you have any questions or concerns please call us at [phone].

## 2019-04-23 NOTE — ED Notes (Signed)
Discharge instructions discussed with pt. Pt verbalized understanding. Pt stable and ambulatory. No signature pad available. 

## 2019-04-23 NOTE — Discharge Instructions (Signed)
As discussed, is very portly follow-up with our ophthalmology and otolaryngology colleagues in the coming days.  Please be sure to go to your appointment tomorrow at 8:15 in the morning, and subsequently call Dr. Deeann Saint office to set up that appointment.  Return here for concerning changes in your condition.

## 2019-04-23 NOTE — ED Triage Notes (Addendum)
Pt reports that he fell out of his chair on Friday, unsure of how long he was on the floor, thinks he may have had loc, currently on xarelto, has swelling to R eye with laceration to eyelid, pain to R 5th digit, L hand and wrist. A/ox4, resp e/u, nad. pcp sent patient to ed for further evaluation. PA Phylliss Bob at bedside to evaluate patient.

## 2019-04-23 NOTE — Assessment & Plan Note (Signed)
Vision loss and eye pain/edema of the right: The patient stated that he fell this past Friday.  See Fall A/P from same day.  He believes he struck his eye on a orange stool.  Since that time he has lost vision in the right eye, cannot open the right eye, as mild to moderate pain and swelling of the area.  He was tried little to improve his symptoms.   On exam there is prominent edema of the soft tissue of the upper eyelid, mild edema below the eye, there is a mild superficial abrasion approximately 2.5 cm in length along the upper outer right eyelid that is currently nonbleeding.  Exam of the eye itself is limited due to pain but the visualized portion reveals a severely hemorrhagic conjunctiva, which is minimally responsive associated with a constricted pupil to approximately 1.5 mm.  Patient denies being able to see out of his right eye.  There is no obvious blood or fluid in the anterior chamber of the eye.  Plan: My major concern is for orbital compartment syndrome given his constricted pupil on the right in the setting of right orbital pain, conjunctival hemorrhage, trauma and vision loss.  Would like to obtain a CT of the head but am unable to expedite this from the clinic to rule out a periorbital fracture. I advised the patient to go to the ER to be evaluated today.

## 2019-04-23 NOTE — Assessment & Plan Note (Addendum)
Syncope: Was sitting in his chair Friday and felt uneasy. He stated that the last thing he remembers was leaning forward to spit. He then heard a thump. He then awoke the next morning. He does not recall how long he was down. He called a friend around 9-9:30.  Has a history of atrial fibrillation. Denied fever, chills, myalgias, chest pain. Endorsed shortness of breath and wheezing prior to the event. Denied loss of bowl or bladder control while down. Did not bit his tongue. He did endorse hitting his head on the right side on stool, he thinks. Endorsed missing his torsemide several times the week of Thanksgiving but his weight appears stable in the chart today at 315 from 311.  He took his Xultophy 50U before he fell that day. His morning CBG was 108, there were no additional values from that day.   Plan:  Plan for BMP and EKG today She is going to the ER they likely be able to obtain these more quickly

## 2019-04-23 NOTE — ED Provider Notes (Signed)
[facility] EMERGENCY DEPARTMENT Provider Note   CSN: [id number] Arrival date & time: 04/23/19  1423     History   Chief Complaint Chief Complaint  Patient presents with   Fall    HPI Terry Parks is a 63 y.o. male.     HPI Patient presents after a fall that occurred 3 days ago with pain in multiple areas and diminished vision in his right eye. Patient knowledges multiple medical issues, but states that he was in his usual state of health. He notes that he was sitting down, felt lightheaded, dizzy, and subsequently had a fall. His fall resulted in swelling, bleeding in his right face and vision changes. Since that time he has had difficulty with vision from the right eye, with blurriness, and pain in this area.  My pain is sore, moderate. No additional episodes of syncope, no other substantial head pain, no neck pain, no confusion, disorientation. Today the patient went to his physician's office due to persistent vision disturbance and was sent here for evaluation.  Past Medical History:  Diagnosis Date   Abscessed tooth    top back large cavity no pain or drainage, one on bottom  pt pulled tooth 4-5 months ago, right top large hole in tooth   AKI (acute kidney injury) ([place])    Allergic rhinitis    Anemia    Anxiety    Asthma    Atrial fibrillation (HCC)    BPH (benign prostatic hypertrophy)    Massive BPH noted on cystoscopy 1/23/ 2012 by Dr. Risa Grill.   Cancer [facility])    prostate cancer 2019   Cardiomyopathy [facility])    CHF (congestive heart failure) ([place])    Cough 03/30/2012   Depression    Diabetes mellitus 04/08/2008   type 2   Foley catheter in place 07-05-17 placed   Headache(784.0)    hx migraines none recent   Hyperlipemia    Hypertension    Hypertensive cardiopathy 03/01/2006   2-D echocardiogram 02/01/2012 showed moderate LVH, mildly to moderately reduced left ventricular systolic function with an estimated ejection  fraction of 40-45%, and diffuse hypokinesis.  A nuclear medicine stress study done 01/31/2012 showed no reversible ischemia, a small mid anterior wall fixed defect/infarct, and ejection fraction 42%.       Neck pain    Nephrolithiasis 05/29/2010   CT scan of abdomen/pelvis on 05/29/2010 showed an obstructing approximate 1-2 mm calculus at the left UVJ, and an approximate 1-2 mm left lower pole renal calculus.   Patient had continuing severe pain , and an elevation of his serum creatinine to a value of 1.75 on 06/06/2010.  Patient underwent cystoscopy on 06/08/2010 by Dr. Risa Grill, but attempts at retrograde pyelogram and ureteroscopy were unsucc   Numbness 01/08/2018   Obstructive sleep apnea 03/06/2008   Sleep study 03/06/08 showed severe OSA/hypopnea syndrome, with successful CPAP titration to 13 CWP using a medium ResMed Mirage Quattro full face mask with heated humidifier.    Rash 04/17/2014   Renal calculus 05/29/2010   CT scan of abdomen/pelvis on 05/29/2010 showed an obstructing approximate 1-2 mm calculus at the left UVJ, and an approximate 1-2 mm left lower pole renal calculus.   Patient had continuing severe pain , and an elevation of his serum creatinine to a value of 1.75 on 06/06/2010.  The stone had apparently passed and was not seen on repeat CT 06/08/2010.   Sleep apnea    haven't use cpap in 2 years   Tooth  pain 10/29/2017   Urinary straining 11/02/2016    Patient Active Problem List   Diagnosis Date Noted   Syncope and collapse 04/23/2019   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   Abdominal discomfort 03/09/2019   Constipation 03/09/2019   Depression 01/31/2019   Gastroesophageal reflux disease with esophagitis 11/25/2018   Hx of esophagitis 11/25/2018   Right hip pain 09/19/2018   Iron deficiency anemia 04/28/2018   Diastasis recti 04/28/2018   Abdominal hernia 03/10/2018   Stage 3 chronic kidney disease 02/11/2018   Healthcare maintenance 02/11/2018   Physical  deconditioning 02/11/2018   Microcytosis 02/11/2018   Pelvic abscess in male [facility]) 01/14/2018   SBO (small bowel obstruction) ([place]) 01/13/2018   Stress incontinence of urine 01/06/2018   Pain due to dental caries 11/09/2017   Prostate cancer ([place]) 10/06/2017   Bladder mass 07/13/2017   Persistent atrial fibrillation    Vitamin D deficiency 06/22/2016   Neuropathy in diabetes ([place]) 10/07/2015   Erectile dysfunction 04/17/2014   Allergic rhinitis 10/19/2012   Diabetic retinopathy ([place]) 10/04/2012   BPH    Type 2 diabetes mellitus with peripheral neuropathy ([place]) 04/08/2008   Obstructive sleep apnea    Morbid obesity ([place])    Hyperlipidemia associated with type 2 diabetes mellitus ([place])    Hypertension associated with diabetes ([place])    Chronic combined systolic and diastolic CHF (congestive heart failure) ([place]) 03/01/2006    Past Surgical History:  Procedure Laterality Date   big toe nails removed Bilateral 20 yrs ago   CARDIOVERSION N/A 06/08/2017   Procedure: CARDIOVERSION;  Surgeon: Josue Hector, MD;  Location: [facility] ENDOSCOPY;  Service: Cardiovascular;  Laterality: N/A;   COLONOSCOPY     CYSTOSCOPY W/ RETROGRADES     IR RADIOLOGIST EVAL & MGMT  02/02/2018   LYMPHADENECTOMY Bilateral 10/06/2017   Procedure: LYMPHADENECTOMY;  Surgeon: Lucas Mallow, MD;  Location: [facility] ORS;  Service: Urology;  Laterality: Bilateral;   ROBOT ASSISTED LAPAROSCOPIC RADICAL PROSTATECTOMY N/A 10/06/2017   Procedure: XI ROBOTIC ASSISTED LAPAROSCOPIC RADICAL PROSTATECTOMY;  Surgeon: Lucas Mallow, MD;  Location: [facility] ORS;  Service: Urology;  Laterality: N/A;   TRANSURETHRAL RESECTION OF BLADDER TUMOR N/A 07/13/2017   Procedure: TRANSURETHRAL RESECTION OF PROSTATE;  Surgeon: Lucas Mallow, MD;  Location: [facility] ORS;  Service: Urology;  Laterality: N/A;        Home Medications    Prior to Admission medications   Medication Sig Start Date End Date Taking? Authorizing  Provider  acetaminophen (TYLENOL) 325 MG tablet Take 325-650 mg by mouth every 6 (six) hours as needed (for pain).   Yes [provider]  amiodarone (PACERONE) 200 MG tablet Take 1 tablet (200 mg total) by mouth daily. 06/21/18  Yes [name], Vahini, MD  amLODipine-atorvastatin (CADUET) 5-40 MG tablet Take 1 tablet by mouth daily. 01/31/19 05/01/19 Yes [name], Vahini, MD  bisacodyl (DULCOLAX) 5 MG EC tablet Take 5 mg by mouth daily as needed for moderate constipation.   Yes [provider]  carvedilol (COREG) 25 MG tablet Take 1 tablet (25 mg total) by mouth 2 (two) times daily with a meal. 12/26/18 04/23/19 Yes [name], [name], MD  cholecalciferol (VITAMIN D3) 25 MCG (1000 UT) tablet Take 1 tablet (1,000 Units total) by mouth daily. 09/20/18  Yes [name], Vahini, MD  DULoxetine (CYMBALTA) 60 MG capsule Take 1 capsule (60 mg total) by mouth daily. 06/21/18 06/21/19 Yes [name], Vahini, MD  gabapentin (NEURONTIN) 300 MG capsule Take 1 capsule (300 mg  total) by mouth 2 (two) times daily. 10/18/18  Yes [name], Vahini, MD  Insulin Degludec-Liraglutide (XULTOPHY) 100-3.6 UNIT-MG/ML SOPN Inject 50 Units into the skin daily. 10/18/18  Yes [name], Vahini, MD  isosorbide mononitrate (IMDUR) 30 MG 24 hr tablet Take 1 tablet (30 mg total) by mouth daily. 06/21/18  Yes [name], Vahini, MD  losartan (COZAAR) 100 MG tablet Take 1 tablet (100 mg total) by mouth daily. 11/22/18  Yes [name], [name], MD  metformin (FORTAMET) 1000 MG (OSM) 24 hr tablet Take 2 tablets (2,000 mg total) by mouth daily with breakfast. Patient taking differently: Take 2,000 mg by mouth 2 (two) times daily with a meal.  07/07/18 04/23/19 Yes [name], Vahini, MD  omeprazole (PRILOSEC) 40 MG capsule Take 1 capsule (40 mg total) by mouth 2 (two) times daily before a meal. 01/11/19  Yes Mansouraty, [name], MD  rivaroxaban (XARELTO) 20 MG TABS tablet Take 1 tablet (20 mg total) by mouth daily with breakfast. Patient taking differently: Take 20 mg by  mouth daily with supper.  11/22/18  Yes [name], Vahini, MD  spironolactone (ALDACTONE) 50 MG tablet Take 1 tablet (50 mg total) by mouth daily. 12/26/18  Yes [name], [name], MD  torsemide (DEMADEX) 20 MG tablet Take 2.5 tablets (50 mg total) by mouth 2 (two) times daily. Patient taking differently: Take 100 mg by mouth at bedtime.  03/15/19 07/01/19 Yes [name], [name], MD  Accu-Chek Softclix Lancets lancets Check blood sugar three times a day as instructed 07/28/18   [name], MD  Blood Glucose Monitoring Suppl (ACCU-CHEK AVIVA) device Use as instructed 07/20/18 07/20/19  Forde Dandy, PharmD  glucose blood (ACCU-CHEK AVIVA) test strip Use to check blood sugar 3 times daily diag code E11.42. insulin dependent 07/20/18   Forde Dandy, PharmD  Insulin Pen Needle (CARETOUCH PEN NEEDLES) 31G X 6 MM MISC 1 pen by Does not apply route at bedtime. 06/21/18   [name], MD    Family History Family History  Problem Relation Age of Onset   Breast cancer Mother    Hypertension Father    Diabetes Maternal Grandmother    Colon cancer Neg Hx    Prostate cancer Neg Hx    Heart attack Neg Hx    Esophageal cancer Neg Hx    Inflammatory bowel disease Neg Hx    Liver disease Neg Hx    Pancreatic cancer Neg Hx    Rectal cancer Neg Hx    Stomach cancer Neg Hx     Social History Social History   Tobacco Use   Smoking status: Never Smoker   Smokeless tobacco: Never Used  Substance Use Topics   Alcohol use: No    Alcohol/week: 0.0 standard drinks   Drug use: No     Allergies   Lisinopril   Review of Systems Review of Systems  Constitutional:       Per HPI, otherwise negative  HENT:       Per HPI, otherwise negative  Respiratory:       Per HPI, otherwise negative  Cardiovascular:       Per HPI, otherwise negative  Gastrointestinal: Negative for vomiting.  Endocrine:       Negative aside from HPI  Genitourinary:       Neg aside from HPI   Musculoskeletal:        Per HPI, otherwise negative  Skin: Negative.   Neurological: Negative for syncope.  Hematological: Bruises/bleeds easily.     Physical Exam Updated Vital Signs  BP 139/85 (BP Location: Right Arm)    Pulse 96    Temp 98.6 F (37 C) (Oral)    Resp 18    Ht 6\' 1"  (1.854 m)    Wt (!) 142.9 kg    SpO2 93%    BMI 41.56 kg/m   Physical Exam Vitals signs and nursing note reviewed.  Constitutional:      General: He is not in acute distress.    Appearance: He is well-developed.  HENT:     Head: Normocephalic.   Eyes:     Extraocular Movements:     Left eye: Normal extraocular motion and no nystagmus.     Conjunctiva/sclera:     Right eye: Chemosis and hemorrhage present.   Cardiovascular:     Rate and Rhythm: Normal rate and regular rhythm.     Pulses: Normal pulses.  Pulmonary:     Effort: Pulmonary effort is normal. No respiratory distress.     Breath sounds: No stridor.  Abdominal:     General: There is no distension.  Musculoskeletal:       Arms:  Skin:    General: Skin is warm and dry.  Neurological:     Mental Status: He is alert and oriented to person, place, and time.      ED Treatments / Results  Labs (all labs ordered are listed, but only abnormal results are displayed) Labs Reviewed  COMPREHENSIVE METABOLIC PANEL - Abnormal; Notable for the following components:      Result Value   Glucose, Bld 144 (*)    Creatinine, Ser 1.75 (*)    Calcium 8.8 (*)    GFR calc non Af Amer 41 (*)    GFR calc Af Amer 48 (*)    All other components within normal limits  CBC WITH DIFFERENTIAL/PLATELET    Radiology Dg Chest 2 View  Result Date: 04/23/2019 CLINICAL DATA:  Chest pain.  Shortness of breath. EXAM: CHEST - 2 VIEW COMPARISON:  January 08, 2018. FINDINGS: The heart size and mediastinal contours are within normal limits. Both lungs are clear. No pneumothorax or pleural effusion is noted. The visualized skeletal structures are unremarkable. IMPRESSION: No active  cardiopulmonary disease. Electronically Signed   By: Marijo Conception M.D.   On: 04/23/2019 16:25   Ct Head Wo Contrast  Result Date: 04/23/2019 CLINICAL DATA:  Head trauma, headache, swelling of right eyelid, laceration. Additional history provided: Fall, on Xareltoaa. EXAM: CT HEAD WITHOUT CONTRAST CT MAXILLOFACIAL WITHOUT CONTRAST CT CERVICAL SPINE WITHOUT CONTRAST TECHNIQUE: Contiguous axial images were obtained from the base of the skull through the vertex without intravenous contrast. Multidetector CT imaging of the maxillofacial structures was performed. Multiplanar CT image reconstructions were also generated. A small metallic BB was placed on the right temple in order to reliably differentiate right from left. Multidetector CT imaging of the cervical spine was performed without intravenous contrast. Multiplanar CT image reconstructions were also generated. COMPARISON:  CT angiogram head 01/09/2018, brain MRI 01/09/2018, cervical spine CT 02/21/2007 FINDINGS: CT HEAD FINDINGS Brain: No evidence of acute intracranial hemorrhage. No demarcated cortical infarction. No evidence of intracranial mass. No midline shift or extra-axial fluid collection. Vascular: No hyperdense vessel.  Atherosclerotic calcifications Skull: Normal. Negative for fracture or focal lesion. CT MAXILLOWFACIAL FINDINGS Osseous: Acute right orbital blowout fracture with acute medially displaced fractures of the right lamina papyracea as well as acute fracture of the right orbital floor. Inferior depression of right orbital floor fracture fragments with herniation of orbital  fat into the right maxillary sinus. Several foci of gas within the extraconal inferior right orbit. Globular appearance of the right inferior rectus muscle. Correlate for extraocular muscle entrapment. Orbits: Right periorbital soft tissue swelling/hematoma. The globes are normal in size and contour. Sinuses: Air-fluid level within the right maxillary sinus, likely  reflecting blood products. Partial opacification of bilateral ethmoid air cells. No significant mastoid effusion. Soft tissues: Right periorbital soft tissue swelling/hematoma. Other: Poor dentition with multiple absent and carious teeth. CT CERVICAL FINDINGS Alignment: Nonspecific reversal of the expected cervical lordosis. Skull base and vertebrae: The basion-dental and atlanto-dental intervals are maintained.No evidence of acute fracture to the cervical spine. Soft tissues and spinal canal: No prevertebral fluid or swelling. No visible canal hematoma. Disc levels: Cervical spondylosis, most notable at C5-C6 where a posterior disc osteophyte complex contributes to at least mild/moderate spinal canal stenosis. Upper chest: No consolidation within the imaged lung apices. No visible pneumothorax. IMPRESSION: CT head: No evidence of acute intracranial abnormality. CT maxillofacial: Acute right orbital blowout fracture with medially displaced fractures of the right lamina papyracea and acute fracture of the right orbital floor. Inferior depression of right orbital floor fracture fragments with herniation of orbital fat into the right maxillary sinus. Globular appearance of the right inferior rectus muscle. Correlate for extraocular muscle entrapment. Additionally, there are several small foci of extraconal gas within the inferior right orbit. CT cervical spine: 1. No evidence of acute fracture to the cervical spine. 2. Cervical spondylosis, greatest at C5-C6 where a posterior disc osteophyte complex contributes to at least mild/moderate spinal canal stenosis. Electronically Signed   By: Kellie Simmering DO   On: 04/23/2019 17:10   Ct Cervical Spine Wo Contrast  Result Date: 04/23/2019 CLINICAL DATA:  Head trauma, headache, swelling of right eyelid, laceration. Additional history provided: Fall, on Xareltoaa. EXAM: CT HEAD WITHOUT CONTRAST CT MAXILLOFACIAL WITHOUT CONTRAST CT CERVICAL SPINE WITHOUT CONTRAST TECHNIQUE:  Contiguous axial images were obtained from the base of the skull through the vertex without intravenous contrast. Multidetector CT imaging of the maxillofacial structures was performed. Multiplanar CT image reconstructions were also generated. A small metallic BB was placed on the right temple in order to reliably differentiate right from left. Multidetector CT imaging of the cervical spine was performed without intravenous contrast. Multiplanar CT image reconstructions were also generated. COMPARISON:  CT angiogram head 01/09/2018, brain MRI 01/09/2018, cervical spine CT 02/21/2007 FINDINGS: CT HEAD FINDINGS Brain: No evidence of acute intracranial hemorrhage. No demarcated cortical infarction. No evidence of intracranial mass. No midline shift or extra-axial fluid collection. Vascular: No hyperdense vessel.  Atherosclerotic calcifications Skull: Normal. Negative for fracture or focal lesion. CT MAXILLOWFACIAL FINDINGS Osseous: Acute right orbital blowout fracture with acute medially displaced fractures of the right lamina papyracea as well as acute fracture of the right orbital floor. Inferior depression of right orbital floor fracture fragments with herniation of orbital fat into the right maxillary sinus. Several foci of gas within the extraconal inferior right orbit. Globular appearance of the right inferior rectus muscle. Correlate for extraocular muscle entrapment. Orbits: Right periorbital soft tissue swelling/hematoma. The globes are normal in size and contour. Sinuses: Air-fluid level within the right maxillary sinus, likely reflecting blood products. Partial opacification of bilateral ethmoid air cells. No significant mastoid effusion. Soft tissues: Right periorbital soft tissue swelling/hematoma. Other: Poor dentition with multiple absent and carious teeth. CT CERVICAL FINDINGS Alignment: Nonspecific reversal of the expected cervical lordosis. Skull base and vertebrae: The basion-dental and  atlanto-dental intervals are  maintained.No evidence of acute fracture to the cervical spine. Soft tissues and spinal canal: No prevertebral fluid or swelling. No visible canal hematoma. Disc levels: Cervical spondylosis, most notable at C5-C6 where a posterior disc osteophyte complex contributes to at least mild/moderate spinal canal stenosis. Upper chest: No consolidation within the imaged lung apices. No visible pneumothorax. IMPRESSION: CT head: No evidence of acute intracranial abnormality. CT maxillofacial: Acute right orbital blowout fracture with medially displaced fractures of the right lamina papyracea and acute fracture of the right orbital floor. Inferior depression of right orbital floor fracture fragments with herniation of orbital fat into the right maxillary sinus. Globular appearance of the right inferior rectus muscle. Correlate for extraocular muscle entrapment. Additionally, there are several small foci of extraconal gas within the inferior right orbit. CT cervical spine: 1. No evidence of acute fracture to the cervical spine. 2. Cervical spondylosis, greatest at C5-C6 where a posterior disc osteophyte complex contributes to at least mild/moderate spinal canal stenosis. Electronically Signed   By: Kellie Simmering DO   On: 04/23/2019 17:10   Ct Maxillofacial Wo Cm  Result Date: 04/23/2019 CLINICAL DATA:  Head trauma, headache, swelling of right eyelid, laceration. Additional history provided: Fall, on Xareltoaa. EXAM: CT HEAD WITHOUT CONTRAST CT MAXILLOFACIAL WITHOUT CONTRAST CT CERVICAL SPINE WITHOUT CONTRAST TECHNIQUE: Contiguous axial images were obtained from the base of the skull through the vertex without intravenous contrast. Multidetector CT imaging of the maxillofacial structures was performed. Multiplanar CT image reconstructions were also generated. A small metallic BB was placed on the right temple in order to reliably differentiate right from left. Multidetector CT imaging of the  cervical spine was performed without intravenous contrast. Multiplanar CT image reconstructions were also generated. COMPARISON:  CT angiogram head 01/09/2018, brain MRI 01/09/2018, cervical spine CT 02/21/2007 FINDINGS: CT HEAD FINDINGS Brain: No evidence of acute intracranial hemorrhage. No demarcated cortical infarction. No evidence of intracranial mass. No midline shift or extra-axial fluid collection. Vascular: No hyperdense vessel.  Atherosclerotic calcifications Skull: Normal. Negative for fracture or focal lesion. CT MAXILLOWFACIAL FINDINGS Osseous: Acute right orbital blowout fracture with acute medially displaced fractures of the right lamina papyracea as well as acute fracture of the right orbital floor. Inferior depression of right orbital floor fracture fragments with herniation of orbital fat into the right maxillary sinus. Several foci of gas within the extraconal inferior right orbit. Globular appearance of the right inferior rectus muscle. Correlate for extraocular muscle entrapment. Orbits: Right periorbital soft tissue swelling/hematoma. The globes are normal in size and contour. Sinuses: Air-fluid level within the right maxillary sinus, likely reflecting blood products. Partial opacification of bilateral ethmoid air cells. No significant mastoid effusion. Soft tissues: Right periorbital soft tissue swelling/hematoma. Other: Poor dentition with multiple absent and carious teeth. CT CERVICAL FINDINGS Alignment: Nonspecific reversal of the expected cervical lordosis. Skull base and vertebrae: The basion-dental and atlanto-dental intervals are maintained.No evidence of acute fracture to the cervical spine. Soft tissues and spinal canal: No prevertebral fluid or swelling. No visible canal hematoma. Disc levels: Cervical spondylosis, most notable at C5-C6 where a posterior disc osteophyte complex contributes to at least mild/moderate spinal canal stenosis. Upper chest: No consolidation within the  imaged lung apices. No visible pneumothorax. IMPRESSION: CT head: No evidence of acute intracranial abnormality. CT maxillofacial: Acute right orbital blowout fracture with medially displaced fractures of the right lamina papyracea and acute fracture of the right orbital floor. Inferior depression of right orbital floor fracture fragments with herniation of orbital fat into the  right maxillary sinus. Globular appearance of the right inferior rectus muscle. Correlate for extraocular muscle entrapment. Additionally, there are several small foci of extraconal gas within the inferior right orbit. CT cervical spine: 1. No evidence of acute fracture to the cervical spine. 2. Cervical spondylosis, greatest at C5-C6 where a posterior disc osteophyte complex contributes to at least mild/moderate spinal canal stenosis. Electronically Signed   By: Kellie Simmering DO   On: 04/23/2019 17:10    Procedures Procedures (including critical care time)  Medications Ordered in ED Medications  latanoprost (XALATAN) 0.005 % ophthalmic solution 1 drop (has no administration in time range)  HYDROcodone-acetaminophen (NORCO/VICODIN) 5-325 MG per tablet 1 tablet (has no administration in time range)     Initial Impression / Assessment and Plan / ED Course  I have reviewed the triage vital signs and the nursing notes.  Pertinent labs & imaging results that were available during my care of the patient were reviewed by me and considered in my medical decision making (see chart for details).    On repeat exam the patient is in similar condition, now accompanied by his son.  I discussed all findings.  Visual acuity right-sided not obtainable secondary to swelling.   Discussed patient's case with our ENT and ophthalmology colleagues. On repeat exam the patient has had tetracaine, extraocular pressures 24 right, 30 left, discussed with Dr. Valetta Close. 10:40 PM Patient in similar condition, no distress.   Presents several days  after fall, now with concern of right eye pain, swelling Patient found to have multiple facial fractures, diminished visual capacity, likely due at least in part to swelling. Patient has no other neurologic complaints, notable findings, reassuring physical exam. However, with multiple facial fractures, and ophthalmologic trauma, as above, patient has had prompt outpatient follow-up arranged.  Patient and son amenable to this plan.  Final Clinical Impressions(s) / ED Diagnoses   Final diagnoses:  Fall, initial encounter  Closed fracture of facial bone due to fall, initial encounter [facility])     Carmin Muskrat, MD 04/23/19 [id number]

## 2019-04-23 NOTE — Addendum Note (Signed)
Addended by: Nicola Girt on: 04/23/2019 02:34 PM   Modules accepted: Level of Service

## 2019-04-23 NOTE — Progress Notes (Signed)
CC: fall  HPI:Mr.Khayree Calliham is a 63 y.o. male who presents for evaluation of patient fell from his chair injuring his right eye and hand. Please see individual problem based A/P for details.  Past Medical History:  Diagnosis Date  . Abscessed tooth    top back large cavity no pain or drainage, one on bottom  pt pulled tooth 4-5 months ago, right top large hole in tooth  . AKI (acute kidney injury) ([place])   . Allergic rhinitis   . Anemia   . Anxiety   . Asthma   . Atrial fibrillation ([place])   . BPH (benign prostatic hypertrophy)    Massive BPH noted on cystoscopy 1/23/ 2012 by Dr. Risa Grill.  . Cancer [facility])    prostate cancer 2019  . Cardiomyopathy ([place])   . CHF (congestive heart failure) ([place])   . Cough 03/30/2012  . Depression   . Diabetes mellitus 04/08/2008   type 2  . Foley catheter in place 07-05-17 placed  . Headache(784.0)    hx migraines none recent  . Hyperlipemia   . Hypertension   . Hypertensive cardiopathy 03/01/2006   2-D echocardiogram 02/01/2012 showed moderate LVH, mildly to moderately reduced left ventricular systolic function with an estimated ejection fraction of 40-45%, and diffuse hypokinesis.  A nuclear medicine stress study done 01/31/2012 showed no reversible ischemia, a small mid anterior wall fixed defect/infarct, and ejection fraction 42%.      . Neck pain   . Nephrolithiasis 05/29/2010   CT scan of abdomen/pelvis on 05/29/2010 showed an obstructing approximate 1-2 mm calculus at the left UVJ, and an approximate 1-2 mm left lower pole renal calculus.   Patient had continuing severe pain , and an elevation of his serum creatinine to a value of 1.75 on 06/06/2010.  Patient underwent cystoscopy on 06/08/2010 by Dr. Risa Grill, but attempts at retrograde pyelogram and ureteroscopy were unsucc  . Numbness 01/08/2018  . Obstructive sleep apnea 03/06/2008   Sleep study 03/06/08 showed severe OSA/hypopnea syndrome, with successful CPAP titration to 13 CWP using a  medium ResMed Mirage Quattro full face mask with heated humidifier.   . Rash 04/17/2014  . Renal calculus 05/29/2010   CT scan of abdomen/pelvis on 05/29/2010 showed an obstructing approximate 1-2 mm calculus at the left UVJ, and an approximate 1-2 mm left lower pole renal calculus.   Patient had continuing severe pain , and an elevation of his serum creatinine to a value of 1.75 on 06/06/2010.  The stone had apparently passed and was not seen on repeat CT 06/08/2010.  . Sleep apnea    haven't use cpap in 2 years  . Tooth pain 10/29/2017  . Urinary straining 11/02/2016   Review of Systems:  ROS negative except as per HPI.  Physical Exam: Vitals:   04/23/19 1325  BP: (!) 152/80  Pulse: 95  Temp: 97.9 F (36.6 C)  TempSrc: Oral  SpO2: 100%  Weight: (!) 315 lb 12.8 oz (143.2 kg)  Height: 6\' 1"  (1.854 m)   General: A/O x4, in no acute distress, afebrile, nondiaphoretic Eyes: PRRL on the left, Right as per A/P Cardio: RRR, no mrg's  Pulmonary: CTA bilaterally, no wheezing or crackles  Abdomen: Bowel sounds normal, soft, nontender MSK: BLE nontender, nonedematous Neuro: Alert, CNII-XII grossly intact, conversational, strength 5/5 in the upper and lower extremities bilaterally, normal gait Psych: Appropriate affect, not depressed in appearance, engages well  Assessment & Plan:   See Encounters Tab for problem based charting.  Patient  discussed with Dr. Angelia Mould

## 2019-04-24 ENCOUNTER — Encounter: Payer: Medicaid Other | Admitting: Internal Medicine

## 2019-04-24 DIAGNOSIS — S0231XA Fracture of orbital floor, right side, initial encounter for closed fracture: Secondary | ICD-10-CM | POA: Diagnosis not present

## 2019-04-24 MED FILL — CEPHALEXIN 500 MG CAPSULE: 500 | 7 days supply | Qty: 21 | Fill #0

## 2019-04-25 NOTE — Progress Notes (Addendum)
Internal Medicine Clinic Attending  Case discussed with Dr. Harbrecht at the time of the visit.  We reviewed the resident's history and exam and pertinent patient test results.  I agree with the assessment, diagnosis, and plan of care documented in the resident's note.   

## 2019-04-27 ENCOUNTER — Ambulatory Visit: Payer: Medicaid Other | Admitting: Pharmacist

## 2019-05-07 ENCOUNTER — Ambulatory Visit: Payer: Medicaid Other | Admitting: Pharmacist

## 2019-05-08 ENCOUNTER — Encounter: Payer: Medicaid Other | Admitting: Internal Medicine

## 2019-05-16 ENCOUNTER — Telehealth: Payer: Self-pay | Admitting: *Deleted

## 2019-05-16 NOTE — Telephone Encounter (Signed)
PCS form recently submitted was returned from [employer] as there was nothing checked for [place] to ADL's. Form has been placed back in provider's box for this to be addressed. Hubbard Hartshorn, BSN, RN-BC

## 2019-05-17 MED FILL — XARELTO 20 MG TABLET: 20 | 30 days supply | Qty: 30 | Fill #1

## 2019-05-17 MED FILL — GABAPENTIN 300 MG CAPSULE: 300 | 30 days supply | Qty: 60 | Fill #2

## 2019-05-17 MED FILL — LOSARTAN POTASSIUM 50 MG TA: 50 | 30 days supply | Qty: 30 | Fill #4

## 2019-05-18 DIAGNOSIS — S0285XA Fracture of orbit, unspecified, initial encounter for closed fracture: Secondary | ICD-10-CM

## 2019-05-18 HISTORY — DX: Fracture of orbit, unspecified, initial encounter for closed fracture: S02.85XA

## 2019-05-18 HISTORY — PX: FRACTURE SURGERY: SHX138

## 2019-05-21 ENCOUNTER — Ambulatory Visit: Payer: Medicaid Other | Admitting: Pharmacist

## 2019-05-22 NOTE — Telephone Encounter (Signed)
PCP asked for patient to be scheduled with her to discuss need for PCS. Appt 06/28/2019.

## 2019-05-23 ENCOUNTER — Encounter: Payer: Self-pay | Admitting: Dietician

## 2019-05-23 ENCOUNTER — Other Ambulatory Visit: Payer: Self-pay

## 2019-05-23 ENCOUNTER — Ambulatory Visit: Payer: Medicaid Other | Admitting: Dietician

## 2019-05-23 MED FILL — XULTOPHY 100 UNIT-3.6MG/ML: 100-3.6 | 30 days supply | Qty: 15 | Fill #4

## 2019-05-23 NOTE — Progress Notes (Signed)
Documentation for phone call to provide support for behavior change:  His goal is weight loss to help him decrease his need for medicine and   Blood sugar <150 before eating.  He did not mention his weight; his blood sugars are continuing to be <150 fasting.  Call to Mr Afridi to provide support for goals:  Reports he is taking his diabetes medications as ordered:50 units at night; metformin twice a day Meal planning- eating out 6 days a week now that he is working 10 hour days. Going to [employer], etc. Wants to do better", but is tired a lot and plans to start school soon.  No other needs identified today. Reminded him of his appointment with Dr. Maricela Bo next month. Plan: follow up call in 1 month. Debera Lat, RD 05/23/2019 10:32 AM.

## 2019-05-24 DIAGNOSIS — R32 Unspecified urinary incontinence: Secondary | ICD-10-CM | POA: Diagnosis not present

## 2019-06-01 ENCOUNTER — Ambulatory Visit (HOSPITAL_COMMUNITY)
Admission: RE | Admit: 2019-06-01 | Discharge: 2019-06-01 | Disposition: A | Discharge status: Home / Self Care | Payer: Medicaid Other | Source: Ambulatory Visit | Attending: Internal Medicine | Admitting: Internal Medicine

## 2019-06-01 ENCOUNTER — Ambulatory Visit (INDEPENDENT_AMBULATORY_CARE_PROVIDER_SITE_OTHER): Payer: Medicaid Other | Admitting: Internal Medicine

## 2019-06-01 ENCOUNTER — Other Ambulatory Visit: Payer: Self-pay

## 2019-06-01 VITALS — BP 155/77 | HR 89 | Temp 98.0°F | Ht 73.0 in | Wt 320.0 lb

## 2019-06-01 DIAGNOSIS — M79642 Pain in left hand: Secondary | ICD-10-CM

## 2019-06-01 DIAGNOSIS — M79641 Pain in right hand: Secondary | ICD-10-CM

## 2019-06-01 DIAGNOSIS — M79606 Pain in leg, unspecified: Secondary | ICD-10-CM | POA: Insufficient documentation

## 2019-06-01 DIAGNOSIS — Z9181 History of falling: Secondary | ICD-10-CM | POA: Diagnosis not present

## 2019-06-01 DIAGNOSIS — M79644 Pain in right finger(s): Secondary | ICD-10-CM

## 2019-06-01 DIAGNOSIS — M79605 Pain in left leg: Secondary | ICD-10-CM | POA: Diagnosis not present

## 2019-06-01 DIAGNOSIS — M79643 Pain in unspecified hand: Secondary | ICD-10-CM | POA: Insufficient documentation

## 2019-06-01 DIAGNOSIS — S62636A Displaced fracture of distal phalanx of right little finger, initial encounter for closed fracture: Secondary | ICD-10-CM | POA: Diagnosis not present

## 2019-06-01 NOTE — Assessment & Plan Note (Addendum)
Assessment: Patient presents today with right hand pain located at the 5th digit with associated deformity of his DIP joint held in flexion at rest (see image in media). He only experiences pain when he hits his hand or if he tried to straighten out his finger passively. He rates this pain as a 6/10. He is unable to actively straighten his finger. He states that his all began about a month ago when he had a fall. He doesn't remember how he landed but states that his finger has been like this since then. On exam there is no associated bone abnormalities, edema, erythema or warmth. He has not recent imaging of this hand. He likely has a chronic extensor tendon injury as a result of his fall.   Plan: 1. Get XR images of his right hand to assess for fractures. 2. Patient may benefit from splinting, although there is little evidence to support splinting with chronic tendon injuries.  3. Refered patient to hand surgeon for further evaluation in management.[name]

## 2019-06-01 NOTE — Patient Instructions (Signed)
Thank you, [name] for allowing [place] to provide your care today. Today we discussed Finger Deformity/Pain.    I have ordered none labs for you. I will call if any are abnormal.    I have place a referrals to Hand Surgery.   I have ordered the following tests: XR of right hand.   I have ordered the following medication/changed the following medications: none   Please follow-up in as needed.    Should you have any questions or concerns please call the internal medicine clinic at [phone].    Marianna Payment, D.O. [facility]

## 2019-06-01 NOTE — Progress Notes (Signed)
CC: Left Leg Terry Parks  HPI:  [name] is a 64 y.o. male with a past medical history stated below and presents today for Left leg pain. Please see problem based assessment and plan for additional details.  Patient recently since at [facility] on 12/07 for syncope/fall and was sent to [facility] ED left arm and hand pain with decreased vision and edema around his right eye.   Past Medical History:  Diagnosis Date  . Abscessed tooth    top back large cavity no pain or drainage, one on bottom  pt pulled tooth 4-5 months ago, right top large hole in tooth  . AKI (acute kidney injury) ([place])   . Allergic rhinitis   . Anemia   . Anxiety   . Asthma   . Atrial fibrillation ([place])   . BPH (benign prostatic hypertrophy)    Massive BPH noted on cystoscopy 1/23/ 2012 by Dr. Risa Grill.  . Cancer [facility])    prostate cancer 2019  . Cardiomyopathy ([place])   . CHF (congestive heart failure) ([place])   . Cough 03/30/2012  . Depression   . Diabetes mellitus 04/08/2008   type 2  . Foley catheter in place 07-05-17 placed  . Headache(784.0)    hx migraines none recent  . Hyperlipemia   . Hypertension   . Hypertensive cardiopathy 03/01/2006   2-D echocardiogram 02/01/2012 showed moderate LVH, mildly to moderately reduced left ventricular systolic function with an estimated ejection fraction of 40-45%, and diffuse hypokinesis.  A nuclear medicine stress study done 01/31/2012 showed no reversible ischemia, a small mid anterior wall fixed defect/infarct, and ejection fraction 42%.      . Neck pain   . Nephrolithiasis 05/29/2010   CT scan of abdomen/pelvis on 05/29/2010 showed an obstructing approximate 1-2 mm calculus at the left UVJ, and an approximate 1-2 mm left lower pole renal calculus.   Patient had continuing severe pain , and an elevation of his serum creatinine to a value of 1.75 on 06/06/2010.  Patient underwent cystoscopy on 06/08/2010 by Dr. Risa Grill, but attempts at retrograde pyelogram and ureteroscopy were unsucc   . Numbness 01/08/2018  . Obstructive sleep apnea 03/06/2008   Sleep study 03/06/08 showed severe OSA/hypopnea syndrome, with successful CPAP titration to 13 CWP using a medium ResMed Mirage Quattro full face mask with heated humidifier.   . Rash 04/17/2014  . Renal calculus 05/29/2010   CT scan of abdomen/pelvis on 05/29/2010 showed an obstructing approximate 1-2 mm calculus at the left UVJ, and an approximate 1-2 mm left lower pole renal calculus.   Patient had continuing severe pain , and an elevation of his serum creatinine to a value of 1.75 on 06/06/2010.  The stone had apparently passed and was not seen on repeat CT 06/08/2010.  . Sleep apnea    haven't use cpap in 2 years  . Tooth pain 10/29/2017  . Urinary straining 11/02/2016      Review of Systems: Review of Systems  Constitutional: Negative for fever and weight loss.  Respiratory: Negative for cough and shortness of breath.   Cardiovascular: Negative for chest pain and leg swelling.  Genitourinary: Negative for dysuria.  Neurological: Negative for focal weakness and weakness.  Psychiatric/Behavioral: Negative for depression.      There were no vitals filed for this visit.   Physical Exam: Physical Exam  Constitutional: He is oriented to person, place, and time and well-developed, well-nourished, and in no distress.  HENT:  Head: Normocephalic and atraumatic.  Eyes: EOM are  normal.  Cardiovascular: Normal rate, regular rhythm, normal heart sounds and intact distal pulses. Exam reveals no gallop and no friction rub.  No murmur heard. Pulmonary/Chest: Effort normal and breath sounds normal. No respiratory distress. He exhibits no tenderness.  Abdominal: Soft. He exhibits no distension. There is no abdominal tenderness.  Musculoskeletal:        General: Tenderness and deformity present. No edema.     Cervical back: Normal range of motion.     Comments: Decreased range of motion of 5th CIP joint on right hand.    Neurological: He is alert and oriented to person, place, and time.  Skin: Skin is warm and dry.     Assessment & Plan:   See Encounters Tab for problem based charting.  Patient discussed with Dr. Rebeca Alert

## 2019-06-04 NOTE — Assessment & Plan Note (Signed)
Leg pain is secondary to his peripheral neuropathy form diabetes.   Plan: - follow up with PCP for management of chronic disease.

## 2019-06-08 ENCOUNTER — Encounter: Payer: Self-pay | Admitting: Orthopaedic Surgery

## 2019-06-08 ENCOUNTER — Ambulatory Visit: Payer: Medicaid Other | Admitting: Orthopaedic Surgery

## 2019-06-08 ENCOUNTER — Other Ambulatory Visit: Payer: Self-pay

## 2019-06-08 DIAGNOSIS — M25532 Pain in left wrist: Secondary | ICD-10-CM | POA: Diagnosis not present

## 2019-06-08 DIAGNOSIS — S62639A Displaced fracture of distal phalanx of unspecified finger, initial encounter for closed fracture: Secondary | ICD-10-CM | POA: Insufficient documentation

## 2019-06-08 DIAGNOSIS — S62636A Displaced fracture of distal phalanx of right little finger, initial encounter for closed fracture: Secondary | ICD-10-CM

## 2019-06-08 NOTE — Progress Notes (Addendum)
Office Visit Note   Patient: Terry Parks           Date of Birth: Feb 17, 1956           MRN: [id number] Visit Date: 06/08/2019              Requested by: Oda Kilts, MD [place],  [place] [id number] PCP: [name], MD   Assessment & Plan: Visit Diagnoses:  1. Closed avulsion fracture of distal phalanx of finger, initial encounter   2. Pain in left wrist     Plan: Stack splint today to the right small finger.  He will wear this continuously for the next 6 weeks.  Recheck in 6 weeks with two-view x-rays of the right small finger.  Left wrist pain.  xrays were negative.  Recommend symptomatic treatment with ice, elevation, OTC nsaids prn.    Follow-Up Instructions: Return in about 6 weeks (around 07/20/2019).   Orders:  No orders of the defined types were placed in this encounter.  No orders of the defined types were placed in this encounter.     Procedures: No procedures performed   Clinical Data: No additional findings.   Subjective: Chief Complaint  Patient presents with  . Right Hand - Pain    Terry Parks is a 64 year old gentleman comes in for evaluation of bony mallet finger right small finger.  This happened about a month ago.  X-rays were done just recently.  He had a syncopal fall that caused the injury.  He is right-hand dominant.   Review of Systems  Constitutional: Negative.   All other systems reviewed and are negative.    Objective: Vital Signs: There were no vitals taken for this visit.  Physical Exam Vitals and nursing note reviewed.  Constitutional:      Appearance: He is well-developed.  HENT:     Head: Normocephalic and atraumatic.  Eyes:     Pupils: Pupils are equal, round, and reactive to light.  Pulmonary:     Effort: Pulmonary effort is normal.  Abdominal:     Palpations: Abdomen is soft.  Musculoskeletal:        General: Normal range of motion.     Cervical back: Neck supple.  Skin:    General: Skin is  warm.  Neurological:     Mental Status: He is alert and oriented to person, place, and time.  Psychiatric:        Behavior: Behavior normal.        Thought Content: Thought content normal.        Judgment: Judgment normal.     Ortho Exam Right small finger shows extension lag of the small finger.  He has mild discomfort with extension of the finger.  No neurovascular compromise.  Left wrist exam shows mild swelling.  Good ROM without significant pain.   Specialty Comments:  No specialty comments available.  Imaging: No results found.   PMFS History: Patient Active Problem List   Diagnosis Date Noted  . Closed avulsion fracture of distal phalanx of finger 06/08/2019  . Pain in left wrist 06/08/2019  . Leg pain 06/01/2019  . Hand pain 06/01/2019  . Syncope and collapse 04/23/2019  . 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  . Abdominal discomfort 03/09/2019  . Constipation 03/09/2019  . Depression 01/31/2019  . Gastroesophageal reflux disease with esophagitis 11/25/2018  . Hx of esophagitis 11/25/2018  . Right hip pain 09/19/2018  . Iron deficiency anemia 04/28/2018  . Diastasis  recti 04/28/2018  . Abdominal hernia 03/10/2018  . Stage 3 chronic kidney disease 02/11/2018  . Healthcare maintenance 02/11/2018  . Physical deconditioning 02/11/2018  . Microcytosis 02/11/2018  . Pelvic abscess in male [facility]) 01/14/2018  . SBO (small bowel obstruction) ([place]) 01/13/2018  . Stress incontinence of urine 01/06/2018  . Pain due to dental caries 11/09/2017  . Prostate cancer ([place]) 10/06/2017  . Bladder mass 07/13/2017  . Persistent atrial fibrillation   . Vitamin D deficiency 06/22/2016  . Neuropathy in diabetes ([place]) 10/07/2015  . Erectile dysfunction 04/17/2014  . Allergic rhinitis 10/19/2012  . Diabetic retinopathy ([place]) 10/04/2012  . BPH   . Type 2 diabetes mellitus with peripheral neuropathy ([place]) 04/08/2008  . Obstructive sleep apnea   . Morbid obesity ([place])   .  Hyperlipidemia associated with type 2 diabetes mellitus ([place])   . Hypertension associated with diabetes ([place])   . Chronic combined systolic and diastolic CHF (congestive heart failure) ([place]) 03/01/2006   Past Medical History:  Diagnosis Date  . Abscessed tooth    top back large cavity no pain or drainage, one on bottom  pt pulled tooth 4-5 months ago, right top large hole in tooth  . AKI (acute kidney injury) ([place])   . Allergic rhinitis   . Anemia   . Anxiety   . Asthma   . Atrial fibrillation ([place])   . BPH (benign prostatic hypertrophy)    Massive BPH noted on cystoscopy 1/23/ 2012 by Dr. Risa Grill.  . Cancer [facility])    prostate cancer 2019  . Cardiomyopathy ([place])   . CHF (congestive heart failure) ([place])   . Cough 03/30/2012  . Depression   . Diabetes mellitus 04/08/2008   type 2  . Foley catheter in place 07-05-17 placed  . Headache(784.0)    hx migraines none recent  . Hyperlipemia   . Hypertension   . Hypertensive cardiopathy 03/01/2006   2-D echocardiogram 02/01/2012 showed moderate LVH, mildly to moderately reduced left ventricular systolic function with an estimated ejection fraction of 40-45%, and diffuse hypokinesis.  A nuclear medicine stress study done 01/31/2012 showed no reversible ischemia, a small mid anterior wall fixed defect/infarct, and ejection fraction 42%.      . Neck pain   . Nephrolithiasis 05/29/2010   CT scan of abdomen/pelvis on 05/29/2010 showed an obstructing approximate 1-2 mm calculus at the left UVJ, and an approximate 1-2 mm left lower pole renal calculus.   Patient had continuing severe pain , and an elevation of his serum creatinine to a value of 1.75 on 06/06/2010.  Patient underwent cystoscopy on 06/08/2010 by Dr. Risa Grill, but attempts at retrograde pyelogram and ureteroscopy were unsucc  . Numbness 01/08/2018  . Obstructive sleep apnea 03/06/2008   Sleep study 03/06/08 showed severe OSA/hypopnea syndrome, with successful CPAP titration to 13 CWP using a  medium ResMed Mirage Quattro full face mask with heated humidifier.   . Rash 04/17/2014  . Renal calculus 05/29/2010   CT scan of abdomen/pelvis on 05/29/2010 showed an obstructing approximate 1-2 mm calculus at the left UVJ, and an approximate 1-2 mm left lower pole renal calculus.   Patient had continuing severe pain , and an elevation of his serum creatinine to a value of 1.75 on 06/06/2010.  The stone had apparently passed and was not seen on repeat CT 06/08/2010.  . Sleep apnea    haven't use cpap in 2 years  . Tooth pain 10/29/2017  . Urinary straining 11/02/2016    Family History  Problem Relation Age of Onset  . Breast cancer Mother   . Hypertension Father   . Diabetes Maternal Grandmother   . Colon cancer Neg Hx   . Prostate cancer Neg Hx   . Heart attack Neg Hx   . Esophageal cancer Neg Hx   . Inflammatory bowel disease Neg Hx   . Liver disease Neg Hx   . Pancreatic cancer Neg Hx   . Rectal cancer Neg Hx   . Stomach cancer Neg Hx     Past Surgical History:  Procedure Laterality Date  . big toe nails removed Bilateral 20 yrs ago  . CARDIOVERSION N/A 06/08/2017   Procedure: CARDIOVERSION;  Surgeon: Josue Hector, MD;  Location: [facility] END[facility]COPY;  Service: Cardiovascular;  Laterality: N/A;  . COLONOSCOPY    . CYSTOSCOPY W/ RETROGRADES    . IR RADIOLOGIST EVAL & MGMT  02/02/2018  . LYMPHADENECTOMY Bilateral 10/06/2017   Procedure: LYMPHADENECTOMY;  Surgeon: Lucas Mallow, MD;  Location: [facility] ORS;  Service: Urology;  Laterality: Bilateral;  . ROBOT ASSISTED LAPAROSCOPIC RADICAL PROSTATECTOMY N/A 10/06/2017   Procedure: XI ROBOTIC ASSISTED LAPAROSCOPIC RADICAL PROSTATECTOMY;  Surgeon: Lucas Mallow, MD;  Location: [facility] ORS;  Service: Urology;  Laterality: N/A;  . TRANSURETHRAL RESECTION OF BLADDER TUMOR N/A 07/13/2017   Procedure: TRANSURETHRAL RESECTION OF PROSTATE;  Surgeon: Lucas Mallow, MD;  Location: [facility] ORS;  Service: Urology;  Laterality: N/A;   Social History    Occupational History  . Occupation:        Employer: UNEMPLOYED  Tobacco Use  . Smoking status: Never Smoker  . Smokeless tobacco: Never Used  Substance and Sexual Activity  . Alcohol use: No    Alcohol/week: 0.0 standard drinks  . Drug use: No  . Sexual activity: Not Currently

## 2019-06-08 NOTE — Addendum Note (Signed)
Addended by: Azucena Cecil on: 06/08/2019 08:44 AM   Modules accepted: Level of Service

## 2019-06-12 NOTE — Progress Notes (Signed)
Internal Medicine Clinic Attending  I saw and evaluated the patient.  I personally confirmed the key portions of the history and exam documented by Dr. Marianna Payment and I reviewed pertinent patient test results.  The assessment, diagnosis, and plan were formulated together and I agree with the documentation in the resident's note.  Concern for fracture or tendon rupture, referred to hand surgery and x-rays ordered.  Lenice Pressman, M.D., Ph.D.

## 2019-06-13 NOTE — Progress Notes (Signed)
Attempted to call the patient regarding his recent XR results. Left HIPPA compliant voice mail.   Marianna Payment, D.O. Date 06/13/2019 Time 9:14 AM [facility] Internal Medicine, PGY-1 Pager: [phone]

## 2019-06-18 DIAGNOSIS — R32 Unspecified urinary incontinence: Secondary | ICD-10-CM | POA: Diagnosis not present

## 2019-06-20 ENCOUNTER — Encounter: Payer: Self-pay | Admitting: Dietician

## 2019-06-20 ENCOUNTER — Telehealth: Payer: Self-pay | Admitting: Dietician

## 2019-06-20 ENCOUNTER — Ambulatory Visit: Payer: Medicaid Other | Admitting: Dietician

## 2019-06-20 DIAGNOSIS — E1142 Type 2 diabetes mellitus with diabetic polyneuropathy: Secondary | ICD-10-CM

## 2019-06-20 MED ORDER — ACCU-CHEK AVIVA DEVI: 0 refills | Status: AC

## 2019-06-20 NOTE — Telephone Encounter (Signed)
Patient requests a new meter.

## 2019-06-20 NOTE — Progress Notes (Signed)
Documentation for phone call to provide support for behavior change:  His goal is weight loss to help him decrease his need for medicine and Blood sugar <150 before eating.   Terry Parks states that he feels like his weight is decreasing. His scale broke, so he does not know for sure. He reports drinking cran grape Juice Gatorade zero and some ginger ale. He adds water to his juices. He sates his appetite is decreased and he is eating "better stuff- less fried foods" and not eating at night as much.  He states his blood sugars were all < 150 when he was able to check them, but the battery in his meter stopped working and he has not been able to check in 2 weeks.  I agreed to request and rx for a new meter and send them  [facility]   He asks for help getting socks and a belt that Dr. Maricela Bo recommended he get: he asks if he need an prescription then. He prefers they be sent to [place]  If so.   Eye doctor- saw Dr. Jola Schmidt in December and thinks this should be sufficient. I told him I would get the report so we can see if they did a dilated eye exam.   Plan: follow up call in 1 month,. Ask about Physical therapy and dentist/cpap use.  Debera Lat, RD 06/20/2019 10:25 AM.

## 2019-06-22 ENCOUNTER — Telehealth: Payer: Self-pay | Admitting: Dietician

## 2019-06-22 NOTE — Telephone Encounter (Signed)
Received eye exam report from patients' visit to [facility]. It does not appear to be a dilated eye exam or address his diabetes. Suggest patient be referred back to them for diabetic eye exam. Report put in Dr. Thea Gist box. Patient has an appointment on 07/05/2019

## 2019-06-28 ENCOUNTER — Encounter: Payer: Medicaid Other | Admitting: Internal Medicine

## 2019-06-28 ENCOUNTER — Other Ambulatory Visit (HOSPITAL_COMMUNITY): Payer: Self-pay | Admitting: Pharmacist

## 2019-06-28 MED FILL — ACCU-CHEK GUIDE ME W/DEVICE: W/DEVICE | 30 days supply | Qty: 1 | Fill #0

## 2019-06-28 MED FILL — ACCU-CHEK FASTCLIX LANCETS: 33 days supply | Qty: 102 | Fill #0

## 2019-06-28 MED FILL — ACCU-CHEK GUIDE TEST STRIP: 33 days supply | Qty: 100 | Fill #0

## 2019-07-05 ENCOUNTER — Encounter: Payer: Medicaid Other | Admitting: Internal Medicine

## 2019-07-05 ENCOUNTER — Ambulatory Visit: Payer: Medicaid Other | Admitting: Gastroenterology

## 2019-07-12 ENCOUNTER — Ambulatory Visit (INDEPENDENT_AMBULATORY_CARE_PROVIDER_SITE_OTHER): Payer: Medicaid Other | Admitting: Internal Medicine

## 2019-07-12 ENCOUNTER — Encounter: Payer: Self-pay | Admitting: Internal Medicine

## 2019-07-12 VITALS — BP 160/96 | HR 116 | Temp 98.2°F | Ht 73.0 in | Wt 308.0 lb

## 2019-07-12 DIAGNOSIS — I5042 Chronic combined systolic (congestive) and diastolic (congestive) heart failure: Secondary | ICD-10-CM | POA: Diagnosis not present

## 2019-07-12 DIAGNOSIS — I130 Hypertensive heart and chronic kidney disease with heart failure and stage 1 through stage 4 chronic kidney disease, or unspecified chronic kidney disease: Secondary | ICD-10-CM | POA: Diagnosis not present

## 2019-07-12 DIAGNOSIS — E1142 Type 2 diabetes mellitus with diabetic polyneuropathy: Secondary | ICD-10-CM

## 2019-07-12 DIAGNOSIS — I4819 Other persistent atrial fibrillation: Secondary | ICD-10-CM | POA: Diagnosis not present

## 2019-07-12 DIAGNOSIS — N183 Chronic kidney disease, stage 3 unspecified: Secondary | ICD-10-CM

## 2019-07-12 DIAGNOSIS — Z794 Long term (current) use of insulin: Secondary | ICD-10-CM | POA: Diagnosis not present

## 2019-07-12 DIAGNOSIS — E1122 Type 2 diabetes mellitus with diabetic chronic kidney disease: Secondary | ICD-10-CM | POA: Diagnosis not present

## 2019-07-12 DIAGNOSIS — G4733 Obstructive sleep apnea (adult) (pediatric): Secondary | ICD-10-CM

## 2019-07-12 DIAGNOSIS — Z79899 Other long term (current) drug therapy: Secondary | ICD-10-CM | POA: Diagnosis not present

## 2019-07-12 DIAGNOSIS — E1159 Type 2 diabetes mellitus with other circulatory complications: Secondary | ICD-10-CM

## 2019-07-12 LAB — BASIC METABOLIC PANEL
Anion gap: 15 (ref 5–15)
BUN: 17 mg/dL (ref 8–23)
CO2: 25 mmol/L (ref 22–32)
Calcium: 8.8 mg/dL — ABNORMAL LOW (ref 8.9–10.3)
Chloride: 89 mmol/L — ABNORMAL LOW (ref 98–111)
Creatinine, Ser: 2.01 mg/dL — ABNORMAL HIGH (ref 0.61–1.24)
GFR calc Af Amer: 40 mL/min — ABNORMAL LOW (ref >60)
GFR calc non Af Amer: 35 mL/min — ABNORMAL LOW (ref >60)
Glucose, Bld: 586 mg/dL (ref 70–99)
Potassium: 3.9 mmol/L (ref 3.5–5.1)
Sodium: 129 mmol/L — ABNORMAL LOW (ref 135–145)

## 2019-07-12 LAB — POCT URINALYSIS DIPSTICK
Bilirubin, UA: NEGATIVE
Glucose, UA: POSITIVE — AB
Ketones, UA: NEGATIVE
Nitrite, UA: NEGATIVE
Protein, UA: NEGATIVE
Spec Grav, UA: <=1.005 — AB (ref 1.010–1.025)
Urobilinogen, UA: 0.2 E.U./dL
pH, UA: 5.5 (ref 5.0–8.0)

## 2019-07-12 LAB — POCT GLYCOSYLATED HEMOGLOBIN (HGB A1C): Hemoglobin A1C: 12.7 % — AB (ref 4.0–5.6)

## 2019-07-12 LAB — GLUCOSE, CAPILLARY: Glucose-Capillary: >600 mg/dL (ref 70–99)

## 2019-07-12 NOTE — Progress Notes (Signed)
CC: Diabetes Follow up  HPI:  [name] is a 64 y.o. with combined heart failure, peristent afib, osa, dm2 c/b neuropathy, ckd3 who presents for follow up of diabetes. Please see problem based charting for evaluation, assessment, and plan.  Past Medical History:  Diagnosis Date  . Abscessed tooth    top back large cavity no pain or drainage, one on bottom  pt pulled tooth 4-5 months ago, right top large hole in tooth  . AKI (acute kidney injury) ([place])   . Allergic rhinitis   . Anemia   . Anxiety   . Asthma   . Atrial fibrillation ([place])   . BPH (benign prostatic hypertrophy)    Massive BPH noted on cystoscopy 1/23/ 2012 by Dr. [name].  . Cancer [facility])    prostate cancer 2019  . Cardiomyopathy ([place])   . CHF (congestive heart failure) ([place])   . Cough 03/30/2012  . Depression   . Diabetes mellitus 04/08/2008   type 2  . Foley catheter in place 07-05-17 placed  . Headache(784.0)    hx migraines none recent  . Hyperlipemia   . Hypertension   . Hypertensive cardiopathy 03/01/2006   2-D echocardiogram 02/01/2012 showed moderate LVH, mildly to moderately reduced left ventricular systolic function with an estimated ejection fraction of 40-45%, and diffuse hypokinesis.  A nuclear medicine stress study done 01/31/2012 showed no reversible ischemia, a small mid anterior wall fixed defect/infarct, and ejection fraction 42%.      . Neck pain   . Nephrolithiasis 05/29/2010   CT scan of abdomen/pelvis on 05/29/2010 showed an obstructing approximate 1-2 mm calculus at the left UVJ, and an approximate 1-2 mm left lower pole renal calculus.   Patient had continuing severe pain , and an elevation of his serum creatinine to a value of 1.75 on 06/06/2010.  Patient underwent cystoscopy on 06/08/2010 by Dr. [name], but attempts at retrograde pyelogram and ureteroscopy were unsucc  . Numbness 01/08/2018  . Obstructive sleep apnea 03/06/2008   Sleep study 03/06/08 showed severe OSA/hypopnea  syndrome, with successful CPAP titration to 13 CWP using a medium ResMed Mirage Quattro full face mask with heated humidifier.   . Rash 04/17/2014  . Renal calculus 05/29/2010   CT scan of abdomen/pelvis on 05/29/2010 showed an obstructing approximate 1-2 mm calculus at the left UVJ, and an approximate 1-2 mm left lower pole renal calculus.   Patient had continuing severe pain , and an elevation of his serum creatinine to a value of 1.75 on 06/06/2010.  The stone had apparently passed and was not seen on repeat CT 06/08/2010.  . Sleep apnea    haven't use cpap in 2 years  . Tooth pain 10/29/2017  . Urinary straining 11/02/2016   Review of Systems:    Patient denies nausea, vomiting, dizziness  Physical Exam:  Vitals:   07/12/19 1603 07/12/19 1638  BP: (!) 167/100 (!) 160/96  Pulse: (!) 116 (!) 116  Temp: 98.2 F (36.8 C)   TempSrc: Oral   SpO2: 100%   Weight: (!) 308 lb (139.7 kg)   Height: 6\' 1"  (1.854 m)    Physical Exam  Constitutional: Appears well-developed and well-nourished. No distress.  HENT:  Head: Normocephalic and atraumatic.  Eyes: Conjunctivae are normal.  Cardiovascular: Normal rate, regular rhythm and normal heart sounds.  Respiratory: Effort normal and breath sounds normal. No respiratory distress. No wheezes.  GI: Soft. Bowel sounds are normal. No distension. There is no tenderness.  Musculoskeletal: No edema.  Neurological: Is alert.  Skin: Not diaphoretic. No erythema.  Psychiatric: Normal mood and affect. Behavior is normal. Judgment and thought content normal.    Assessment & Plan:   See Encounters Tab for problem based charting.  Patient discussed with Dr. Evette Doffing

## 2019-07-12 NOTE — Patient Instructions (Signed)
It was a pleasure to see you today Mr. Carthan. During this visit your a1c increased signficantly to 12.7. Please make sure that you are taking your insulin (xultophy 50u daily) without missing any doses. It is important that you take all of your other cardiac medications as well.   Please make appointments for follow up with cardiology and dentist. Follow up in 4 weeks.   If you have any questions or concerns, please call our clinic at [phone] between 9am-5pm and after hours call [phone] and ask for the internal medicine resident on call. If you feel you are having a medical emergency please call 911.   Thank you, we look forward to help you remain healthy!  [name], MD Internal Medicine PGY3

## 2019-07-15 NOTE — Assessment & Plan Note (Addendum)
dPatient's a1c during this visit was 12.7 and glucose was >600. Patient was asymptomatic at the time of the clinic visit. He stated that he has not taken his insulin in over a week.   Assessment and plan  The patient stated that he has been having a hard time remembering to take his medication. He feels that his mind is "foggy".   The patient needs lots of support to assist in medication management. Will speak with his son. Will also order home health and [facility] support. The patient was agreeable to this time. The patient will also continue to have support from our diabetes coordinator. Will follow up in 2 weeks.   -bmp  -continue xultophy 50u qd and metformin 2000mg  qd

## 2019-07-15 NOTE — Addendum Note (Signed)
Addended by: [name] on: 07/15/2019 03:55 PM   Modules accepted: Orders

## 2019-07-15 NOTE — Assessment & Plan Note (Signed)
The patient's blood pressure during this visit was 160-170/90-100s. The patient is currently taking spironolactone 50mg  qd, imdur 30mg  qd, carvedilol 25mg  bid. Her last blood pressure visits are   BP Readings from Last 3 Encounters:  07/12/19 (!) 160/96  06/01/19 (!) 155/77  04/23/19 (!) 151/96   Assessment and Plan Patient's blood pressure is not under control likely secondary to medication non-adherence. Have arranged for more medication assistance. Will follow up in 2 weeks.

## 2019-07-15 NOTE — Assessment & Plan Note (Signed)
Patient's last visit with cardiology was in August 2020 during which the patient was told to continue taking amiodarone, Torsemide 50mg  bid,   imdur 30mg  qd, Losartan 100mg  qd, Carvedilol 25mg  bid, and spironolactone 50mg  qd.   -encouraged patient to follow up with cardiology

## 2019-07-17 ENCOUNTER — Telehealth: Payer: Self-pay | Admitting: Dietician

## 2019-07-17 ENCOUNTER — Ambulatory Visit: Payer: Medicaid Other | Admitting: *Deleted

## 2019-07-17 DIAGNOSIS — I5042 Chronic combined systolic (congestive) and diastolic (congestive) heart failure: Secondary | ICD-10-CM

## 2019-07-17 DIAGNOSIS — E1142 Type 2 diabetes mellitus with diabetic polyneuropathy: Secondary | ICD-10-CM

## 2019-07-17 NOTE — Patient Instructions (Signed)
Visit Information  Goals Addressed            This Visit's Progress     Patient Stated   . "i need help with my leg pain" (pt-stated)       CARE PLAN ENTRY (see longtitudinal plan of care for additional care plan information)  Current Barriers:  . Chronic Disease Management support, education, and care coordination needs related to CHF and DMII  Clinical Goal(s) related to CHF and DMII:  Over the next 30 days, patient will:  . Work with the care management team to address educational, disease management, and care coordination needs  . Begin or continue self health monitoring activities as directed today Measure and record cbg (blood glucose) one times daily and Measure and record blood pressure one time daily . Call provider office for new or worsened signs and symptoms Blood glucose findings outside established parameters, Blood pressure findings outside established parameters, Chest pain, Shortness of breath, and New or worsened symptom related to HF and DM II . Call care management team with questions or concerns . Verbalize basic understanding of patient centered plan of care established today  Interventions related to CHF and DMII:  . Evaluation of current treatment plans and patient's adherence to plan as established by provider . Assessed patient understanding of disease states . Assessed patient's education and care coordination needs . Provided disease specific education to patient  . Collaborated with appropriate clinical care team members regarding patient needs  Patient Self Care Activities related to CHF and DMII:  . Patient is unable to independently self-manage chronic health conditions  Initial goal documentation        Mr. Delaguila was given information about Care Management services today including:  1. Care Management services includes personalized support from designated clinical staff supervised by his physician, including individualized plan of care and  coordination with other care providers 2. 24/7 contact phone numbers for assistance for urgent and routine care needs. 3. The patient may stop Care Management services at any time (effective at the end of the month) by phone call to the office staff.  Patient agreed to services and verbal consent obtained.   The patient verbalized understanding of instructions provided today and declined a print copy of patient instruction materials.   The care management team will reach out to the patient again over the next 7 days.   [name] CCM, [facility] RN Care Manager [phone]

## 2019-07-17 NOTE — Chronic Care Management (AMB) (Signed)
Care Management   Initial Visit Note  07/17/2019 Name: Terry Parks MRN: [id number] DOB: 02/04/1956    Objective: Lab Results  Component Value Date   HGBA1C 12.7 (A) 07/12/2019   HGBA1C 7.4 (A) 01/31/2019   HGBA1C 10.3 (A) 10/13/2018   Lab Results  Component Value Date   MICROALBUR 11.3 (H) 04/17/2014   LDLCALC 64 01/09/2018   CREATININE 2.01 (H) 07/12/2019   BP Readings from Last 3 Encounters:  07/12/19 (!) 160/96  06/01/19 (!) 155/77  04/23/19 (!) 151/96    Assessment: Terry Parks is a 64 y.o. year old male who sees [name], [name], MD for primary care. The care management team was consulted for assistance with care management and care coordination needs related to Disease Management, Care Coordination.   Review of patient status, including review of consultants reports, relevant laboratory and other test results, and collaboration with appropriate care team members and the patient's provider was performed as part of comprehensive patient evaluation and provision of care management services.    SDOH (Social Determinants of Health) assessments performed: Yes See Care Plan activities for detailed interventions related to SDOH)  SDOH Interventions     Most Recent Value  SDOH Interventions  SDOH Interventions for the Following Domains  Transportation  Transportation Interventions  Other (Comment) [referred to [facility] [employer] for medical appointments]       Outpatient Encounter Medications as of 07/17/2019  Medication Sig  . Accu-Chek Softclix Lancets lancets Check blood sugar three times a day as instructed  . acetaminophen (TYLENOL) 325 MG tablet Take 325-650 mg by mouth every 6 (six) hours as needed (for pain).  [name] amiodarone (PACERONE) 200 MG tablet Take 1 tablet (200 mg total) by mouth daily.  . bisacodyl (DULCOLAX) 5 MG EC tablet Take 5 mg by mouth daily as needed for moderate constipation.  . Blood Glucose Monitoring Suppl  (ACCU-CHEK AVIVA) device Use as instructed to check blood sugar up to 3 times a day  . cholecalciferol (VITAMIN D3) 25 MCG (1000 UT) tablet Take 1 tablet (1,000 Units total) by mouth daily.  [name] gabapentin (NEURONTIN) 300 MG capsule Take 1 capsule (300 mg total) by mouth 2 (two) times daily.  . Insulin Degludec-Liraglutide (XULTOPHY) 100-3.6 UNIT-MG/ML SOPN Inject 50 Units into the skin daily.  . Insulin Pen Needle (CARETOUCH PEN NEEDLES) 31G X 6 MM MISC 1 pen by Does not apply route at bedtime.  . isosorbide mononitrate (IMDUR) 30 MG 24 hr tablet Take 1 tablet (30 mg total) by mouth daily.  [name] losartan (COZAAR) 100 MG tablet Take 1 tablet (100 mg total) by mouth daily.  [name] omeprazole (PRILOSEC) 40 MG capsule Take 1 capsule (40 mg total) by mouth 2 (two) times daily before a meal.  . rivaroxaban (XARELTO) 20 MG TABS tablet Take 1 tablet (20 mg total) by mouth daily with breakfast. (Patient taking differently: Take 20 mg by mouth daily with supper. )  . spironolactone (ALDACTONE) 50 MG tablet Take 1 tablet (50 mg total) by mouth daily.  . carvedilol (COREG) 25 MG tablet Take 1 tablet (25 mg total) by mouth 2 (two) times daily with a meal.  . DULoxetine (CYMBALTA) 60 MG capsule Take 1 capsule (60 mg total) by mouth daily.  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 metformin (FORTAMET) 1000 MG (OSM) 24 hr tablet Take 2 tablets (2,000 mg total) by mouth daily with breakfast. (  Patient taking differently: Take 2,000 mg by mouth 2 (two) times daily with a meal. )  . torsemide (DEMADEX) 20 MG tablet Take 2.5 tablets (50 mg total) by mouth 2 (two) times daily. (Patient taking differently: Take 100 mg by mouth at bedtime. )   No facility-administered encounter medications on file as of 07/17/2019.    Goals Addressed            This Visit's Progress     Patient Stated   . "i need help with my leg pain" (pt-stated)       CARE  PLAN ENTRY (see longtitudinal plan of care for additional care plan information)  Current Barriers:  . Chronic Disease Management support, education, and care coordination needs related to CHF and DMII  Clinical Goal(s) related to CHF and DMII:  Over the next 30 days, patient will:  . Work with the care management team to address educational, disease management, and care coordination needs  . Begin or continue self health monitoring activities as directed today Measure and record cbg (blood glucose) one times daily and Measure and record blood pressure one time daily . Call provider office for new or worsened signs and symptoms Blood glucose findings outside established parameters, Blood pressure findings outside established parameters, Chest pain, Shortness of breath, and New or worsened symptom related to HF and DM II . Call care management team with questions or concerns . Verbalize basic understanding of patient centered plan of care established today  Interventions related to CHF and DMII:  . Evaluation of current treatment plans and patient's adherence to plan as established by provider . Assessed patient understanding of disease states . Assessed patient's education and care coordination needs . Provided disease specific education to patient  . Collaborated with appropriate clinical care team members regarding patient needs  Patient Self Care Activities related to CHF and DMII:  . Patient is unable to independently self-manage chronic health conditions  Initial goal documentation         Follow up plan:  The care management team will reach out to the patient again over the next 7 days.   Mr. Fendt was given information about Care Management services today including:  1. Care Management services include personalized support from designated clinical staff supervised by a physician, including individualized plan of care and coordination with other care providers 2. 24/7  contact phone numbers for assistance for urgent and routine care needs. 3. The patient may stop Care Management services at any time (effective at the end of the month) by phone call to the office staff.  Patient agreed to services and verbal consent obtained.  [name] CCM, [facility] RN Care Manager [phone]

## 2019-07-18 ENCOUNTER — Ambulatory Visit: Payer: Medicaid Other | Admitting: Dietician

## 2019-07-18 ENCOUNTER — Encounter: Payer: Self-pay | Admitting: *Deleted

## 2019-07-18 DIAGNOSIS — R32 Unspecified urinary incontinence: Secondary | ICD-10-CM | POA: Diagnosis not present

## 2019-07-18 NOTE — Progress Notes (Signed)
Documentation for phone call to provide support for behavior change:  Note recent rise in A1C and patient stopped taking Xultophy. He says since his office visit he stopped drinking juice and restarted his Xultophy 50 units daily.  He also reports that his sleep was messed up for a few days, but is getting back on track  Dentist- not wearing CPAP; CCM is working on helping him find another dentist locally, may arrange transportation also Physical therapy- did not care for it, he wants someone to rub/massage his legs; takes gabapentin as ordered but does not help the pain.  Meter- has one and is using daily;  304/233/187/130/175 fasting- ate ice cream before bed last night 10-11PM. He reports his blood pressure is back under control: 116/70 pulse 85  Lab Results  Component Value Date   HGBA1C 12.7 (A) 07/12/2019   HGBA1C 7.4 (A) 01/31/2019   HGBA1C 10.3 (A) 10/13/2018   HGBA1C >14.0 (A) 07/07/2018   HGBA1C 8.5 (A) 04/07/2018     Wt Readings from Last 5 Encounters:  07/12/19 (!) 308 lb (139.7 kg)  06/01/19 (!) 320 lb (145.2 kg)  04/23/19 (!) 315 lb (142.9 kg)  04/23/19 (!) 315 lb 12.8 oz (143.2 kg)  03/08/19 (!) 311 lb (141.1 kg)   His goal is weight loss to help him decrease his need for medicine.  Diabetes medications:50 units Xultophy after 5 PM; metformin twice a day, not changing pen needles daily Meal planning- eating out 2 days a week; drinking sweet tea/lemonade, subway philly steak & cheese, chicken bojangles, BBQ chicken  Plan: Discussed tight control of blood sugars with less variability for long term to help with neuropathy pain, drinking mostly water, making healthier food choices when eating out. He agreed to follow up with me as needed and continue supportive follow up with  care management team. Debera Lat, RD  07/18/2019 10:09 AM.

## 2019-07-18 NOTE — Progress Notes (Signed)
Internal Medicine Clinic Resident   I have personally reviewed this encounter including the documentation in this note and/or discussed this patient with the care management provider. I will address any urgent items identified by the care management provider and will communicate my actions to the patient's PCP. I have reviewed the patient's CCM visit with my supervising attending.  Neva Seat, MD 07/18/2019

## 2019-07-18 NOTE — Telephone Encounter (Signed)
Terry Parks called to confirm his appointment tomorrow

## 2019-07-19 ENCOUNTER — Telehealth: Payer: Medicaid Other

## 2019-07-19 ENCOUNTER — Ambulatory Visit: Payer: Self-pay | Admitting: *Deleted

## 2019-07-19 ENCOUNTER — Other Ambulatory Visit: Payer: Self-pay | Admitting: Internal Medicine

## 2019-07-19 ENCOUNTER — Ambulatory Visit: Payer: Medicaid Other

## 2019-07-19 ENCOUNTER — Telehealth: Payer: Self-pay

## 2019-07-19 DIAGNOSIS — E1159 Type 2 diabetes mellitus with other circulatory complications: Secondary | ICD-10-CM

## 2019-07-19 DIAGNOSIS — E1142 Type 2 diabetes mellitus with diabetic polyneuropathy: Secondary | ICD-10-CM

## 2019-07-19 DIAGNOSIS — I5042 Chronic combined systolic (congestive) and diastolic (congestive) heart failure: Secondary | ICD-10-CM

## 2019-07-19 NOTE — Patient Instructions (Addendum)
Social Worker Visit Information  Goals we discussed today:  Goals Addressed            This Visit's Progress   . "I want to stay home but could use some help" (pt-stated)       CARE PLAN ENTRY (see longtitudinal plan of care for additional care plan information)  Current Barriers:  . ADL IADL limitations  Clinical Social Work Clinical Goal(s):  [name] Over the next 30 days, patient will work with SW to address concerns related to personal care services.    Interventions: . Provided patient with information about personal care services.   . Discussed plans with patient for ongoing care management follow up and provided patient with direct contact information for care management team . Collaborated with RN Case Manager re: level of care/request for personal care services.    Patient Self Care Activities:  . Patient able to perform MOST ADL's independently  . Does not adhere to prescribed medication regimen . Unable to perform all ADLs independently  Initial goal documentation         Materials Provided: Verbal education about personal care services  provided by phone  Follow Up Plan: SW will follow up with patient by phone over the next 14 days    Total time spent performing care coordination and/or care management activities with the patient by phone or face to face = 20 minutes.   Ronn Melena, [place] Coordination Social Worker [place] [phone]

## 2019-07-19 NOTE — Chronic Care Management (AMB) (Signed)
Care Management   Follow Up Note   07/19/2019 Name: Terry Parks MRN: [id number] DOB: 1955/10/25  Referred by: [name], MD Reason for referral : Care Coordination (HF,DM follow up call)   Terry Parks is a 64 y.o. year old male who is a primary care patient of [name], Terry Spurr, MD. The care management team was consulted for assistance with care management and care coordination needs.    Review of patient status, including review of consultants reports, relevant laboratory and other test results, and collaboration with appropriate care team members and the patient's provider was performed as part of comprehensive patient evaluation and provision of chronic care management services.    SDOH (Social Determinants of Health) assessments performed: No See Care Plan activities for detailed interventions related to [facility])     Advanced Directives: See Care Plan and Vynca application for related entries.   Goals Addressed            This Visit's Progress     Patient Stated   . " the gabapentin does not help my leg pain, it's unbearable at times and I can't care for myself I hurt so bad' (pt-stated)       [place] (see longtitudinal plan of care for additional care plan information)  Current Barriers:  [name] Knowledge Deficits related to pain management- patient reports pain is interfering with his ability to sleep and care for himself. He says he wants to remain in his home (refused offer of investigating ALF/rest home) even though he has self care needs  Nurse Case Manager Clinical Goal(s):  [name] Over the next 30 days, patient will verbalizes or demonstrate relief or control of pain and demonstrate increased ability for self care   Interventions:  . Completed pain assessment . Reviewed pain medications with patient and discussed efficacy . Encouraged patient to make appointment with provider to address ongoing pain issues   Patient Self Care Activities:  . Patient  verbalizes understanding of plan to discuss pain with provider . Unable to self administer medications as prescribed . Unable to perform ADLs independently . Unable to perform IADLs independently  Please see past updates related to this goal by clicking on the "Past Updates" button in the selected goal      . "i need help with my leg pain" (pt-stated)       CARE PLAN ENTRY (see longtitudinal plan of care for additional care plan information)  Current Barriers:  . Chronic Disease Management support, education, and care coordination needs related to CHF and DMII- patient reports difficulty with managing ADL's/IADL's due to fatigue and ongoing leg pain that interrupts his sleep. States Gabapentin is ineffective in treating the leg pain  Clinical Goal(s) related to CHF and DMII:  Over the next 30 days, patient will:  . Work with the care management team to address educational, disease management, and care coordination needs  . Begin or continue self health monitoring activities as directed today Measure and record CBG (blood glucose) at least one time daily and Measure and record blood pressure one time daily . Call provider office for new or worsened signs and symptoms Blood glucose findings outside established parameters, Blood pressure findings outside established parameters, Chest pain, Shortness of breath, and New or worsened symptom related to HF and DM II . Call care management team with questions or concerns . Verbalize basic understanding of patient centered plan of care established today  Interventions related to CHF and DMII:  . Evaluation of current  treatment plans and patient's adherence to plan as established by provider . Assessed patient understanding of disease states . Assessed patient's education and care coordination needs . Provided disease specific education to patient  . Collaborated with appropriate clinical care team members regarding patient needs- requested Rx from  MD for wrist blood pressure cuff and scales .  Patient Self Care Activities related to CHF and DMII:  . Patient is unable to independently self-manage chronic health conditions  Initial goal documentation       Other   . Blood Pressure < 130/80       Reviewed HTN self management strategies: . Take medications as prescribed . Monitor your blood pressure at home . Follow a low salt diet- limit consumption of fast foods, packaged foods . Get regular exercise . Maintain a healthy weight . Manage stress      . HEMOGLOBIN A1C < 7.0       Reviewed diabetes self management actions:  Glucose monitoring per provider recommendations  Take medications as directed- please consider adding SGLT2i to medication regimen for cardiorenal protection and improved glycemic control  Perform Quality checks on blood meter if suspicious of readings  Eat Healthy- Reviewed plate method   Check feet daily- notify MD for non healing sores  Visit provider every 3-6 months as directed  Hbg A1C level every 3-6 months.  Eye Exam yearly- patient is overdue for yearly diabetic eye exam    . LDL CALC < 100       Per chart review - most recent lipid panel drawn 08/22/17- patient is overdue for yearly lipid panel        The care management team will reach out to the patient again over the next 7 days.   [name] RN, CCM, [facility] RN Care Manager [phone]

## 2019-07-19 NOTE — Progress Notes (Signed)
Refer to ccm  Internal Medicine Clinic Resident   I have personally reviewed this encounter including the documentation in the note on 07/12/19 and/or discussed this patient with the care management provider. I will address any urgent items identified by the care management provider and will communicate my actions to the patient's PCP. I have reviewed the patient's CCM visit with my supervising attending.  [name], MD 07/19/2019

## 2019-07-19 NOTE — Chronic Care Management (AMB) (Addendum)
  Chronic Care Management    Clinical Social Work CCM Outreach Note  07/19/2019 Name: [name] MRN: [id number] DOB: 1955/06/23  Terry Parks is a 64 y.o. year old male who is a primary care patient of [name], [name], MD . The CCM team was consulted for assistance with Level of Care Concerns.   LCSW reached out to [name] today by phone to introduce and offer CCM services.   [name] was given information about Chronic Care Management services today including:  1. CCM service includes personalized support from designated clinical staff supervised by his physician, including individualized plan of care and coordination with other care providers 2. 24/7 contact phone numbers for assistance for urgent and routine care needs. 3. Service will only be billed when office clinical staff spend 20 minutes or more in a month to coordinate care. 4. Only one practitioner may furnish and bill the service in a calendar month. 5. The patient may stop CCM services at any time (effective at the end of the month) by phone call to the office staff. 6. The patient will be responsible for cost sharing (co-pay) of up to 20% of the service fee (after annual deductible is met).  Patient agreed to services and verbal consent obtained.      Goals       . "I want to stay home but could use some help" (pt-stated)     CARE PLAN ENTRY (see longtitudinal plan of care for additional care plan information)  Current Barriers:  . ADL IADL limitations  Social Work Clinical Goal(s):  [name] Over the next 30 days, patient will work with SW to address concerns related to personal care services.    Interventions: . Provided patient with information about personal care services.   . Discussed plans with patient for ongoing care management follow up and provided patient with direct contact information for care management team . Collaborated with RN Case Manager re: level of care/request for personal care  services.   . Plan to collaborate with provider for completion of request for personal care services and fax to [facility] when completed.    Patient Self Care Activities:  . Patient able to perform MOST ADL's independently  . Does not adhere to prescribed medication regimen . Unable to perform all ADLs independently  Initial goal documentation     .       Follow Up Plan: SW will follow up with patient by phone over the next 14 days   Total time spent performing care coordination and/or care management activities with the patient by phone or face to face = 20 minutes.   Ronn Melena, [place] Coordination Social Worker [place] [phone]

## 2019-07-19 NOTE — Patient Instructions (Signed)
Visit Information  Goals Addressed            This Visit's Progress     Patient Stated   . " the gabapentin does not help my leg pain, it's unbearable at times and I can't care for myself I hurt so bad' (pt-stated)       [place] (see longtitudinal plan of care for additional care plan information)  Current Barriers:  [name] Knowledge Deficits related to pain management- patient reports pain is interfering with his ability to sleep and care for himself. He says he wants to remain in his home (refused offer of investigating ALF/rest home) even though he has self care needs  Nurse Case Manager Clinical Goal(s):  [name] Over the next 30 days, patient will verbalizes or demonstrate relief or control of pain and demonstrate increased ability for self care   Interventions:  . Completed pain assessment . Reviewed pain medications with patient and discussed efficacy . Encouraged patient to make appointment with provider to address ongoing pain issues and for consideration of referral to pain management clinic/chiropractor/etc  Patient Self Care Activities:  . Patient verbalizes understanding of plan to discuss pain with provider . Unable to self administer medications as prescribed . Unable to perform ADLs independently . Unable to perform IADLs independently  Please see past updates related to this goal by clicking on the "Past Updates" button in the selected goal      . "i need help with my leg pain" (pt-stated)       CARE PLAN ENTRY (see longtitudinal plan of care for additional care plan information)  Current Barriers:  . Chronic Disease Management support, education, and care coordination needs related to CHF and DMII- patient reports difficulty with managing ADL's/IADL's due to fatigue and ongoing leg pain that interrupts his sleep. States Gabapentin is ineffective in treating the leg pain  Clinical Goal(s) related to CHF and DMII:  Over the next 30 days, patient will:  . Work  with the care management team to address educational, disease management, and care coordination needs  . Begin or continue self health monitoring activities as directed today Measure and record CBG (blood glucose) at least one time daily and Measure and record blood pressure one time daily . Call provider office for new or worsened signs and symptoms Blood glucose findings outside established parameters, Blood pressure findings outside established parameters, Chest pain, Shortness of breath, and New or worsened symptom related to HF and DM II . Call care management team with questions or concerns . Verbalize basic understanding of patient centered plan of care established today  Interventions related to CHF and DMII:  . Evaluation of current treatment plans and patient's adherence to plan as established by provider . Assessed patient understanding of disease states . Assessed patient's education and care coordination needs . Provided disease specific education to patient  . Collaborated with appropriate clinical care team members regarding patient needs- requested Rx from MD for blood pressure cuff and scales  .  Patient Self Care Activities related to CHF and DMII:  . Patient is unable to independently self-manage chronic health conditions  Initial goal documentation       Other   . Blood Pressure < 130/80       Reviewed HTN self management strategies: . Take medications as prescribed . Monitor your blood pressure at home . Follow a low salt diet- limit consumption of fast foods, packaged foods . Get regular exercise . Maintain a healthy  weight . Manage stress      . HEMOGLOBIN A1C < 7.0       Reviewed diabetes self management actions:  Glucose monitoring per provider recommendations  Take medications as directed  Perform Quality checks on blood meter if suspicious of readings  Eat Healthy- Reviewed plate method   Check feet daily- notify MD for non healing sores  Visit  provider every 3-6 months as directed  Hbg A1C level every 3-6 months.  Eye Exam yearly    . LDL CALC < 100       Per chart review - most recent lipid panel drawn 08/22/17       The patient verbalized understanding of instructions provided today and declined a print copy of patient instruction materials.   The care management team will reach out to the patient again on 07/20/19.  [name] RN, CCM, [facility] RN Care Manager [phone]

## 2019-07-19 NOTE — Telephone Encounter (Signed)
07/19/2019 Spoke with patient about Medicaid Transportation for medical appointments and [employer] for other errands. Also spoke with patient about [facility] dentist that accept Medicaid. Ambrose Mantle [phone]

## 2019-07-20 ENCOUNTER — Telehealth: Payer: Self-pay

## 2019-07-20 ENCOUNTER — Ambulatory Visit: Payer: Self-pay

## 2019-07-20 ENCOUNTER — Ambulatory Visit: Payer: Medicaid Other | Admitting: *Deleted

## 2019-07-20 DIAGNOSIS — E1142 Type 2 diabetes mellitus with diabetic polyneuropathy: Secondary | ICD-10-CM

## 2019-07-20 DIAGNOSIS — I152 Hypertension secondary to endocrine disorders: Secondary | ICD-10-CM

## 2019-07-20 DIAGNOSIS — E1159 Type 2 diabetes mellitus with other circulatory complications: Secondary | ICD-10-CM

## 2019-07-20 DIAGNOSIS — I5042 Chronic combined systolic (congestive) and diastolic (congestive) heart failure: Secondary | ICD-10-CM

## 2019-07-20 NOTE — Chronic Care Management (AMB) (Signed)
Chronic Care Management    Clinical Social Work Follow Up Note  07/20/2019 Name: Terry Parks MRN: [id number] DOB: [birth year]  Terry Parks is a 64 y.o. year old male who is a primary care patient of [name], [name], MD. The CCM team was consulted for assistance with Level of Care Concerns; request for [name].     Review of patient status, including review of consultants reports, other relevant assessments, and collaboration with appropriate care team members and the patient's provider was performed as part of comprehensive patient evaluation and provision of chronic care management services.    SDOH (Social Determinants of Health) assessments performed: No    Outpatient Encounter Medications as of 07/20/2019  Medication Sig  . Accu-Chek Softclix Lancets lancets Check blood sugar three times a day as instructed  . acetaminophen (TYLENOL) 325 MG tablet Take 325-650 mg by mouth every 6 (six) hours as needed (for pain).  [name] amiodarone (PACERONE) 200 MG tablet Take 1 tablet (200 mg total) by mouth daily.  . bisacodyl (DULCOLAX) 5 MG EC tablet Take 5 mg by mouth daily as needed for moderate constipation.  . Blood Glucose Monitoring Suppl (ACCU-CHEK AVIVA) device Use as instructed to check blood sugar up to 3 times a day  . carvedilol (COREG) 25 MG tablet Take 1 tablet (25 mg total) by mouth 2 (two) times daily with a meal.  . cholecalciferol (VITAMIN D3) 25 MCG (1000 UT) tablet Take 1 tablet (1,000 Units total) by mouth daily.  . DULoxetine (CYMBALTA) 60 MG capsule Take 1 capsule (60 mg total) by mouth daily.  [name] gabapentin (NEURONTIN) 300 MG capsule Take 1 capsule (300 mg total) by mouth 2 (two) times daily.  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 Insulin Degludec-Liraglutide (XULTOPHY) 100-3.6 UNIT-MG/ML SOPN Inject 50 Units into the skin daily.  . Insulin Pen Needle (CARETOUCH PEN NEEDLES)  31G X 6 MM MISC 1 pen by Does not apply route at bedtime.  . isosorbide mononitrate (IMDUR) 30 MG 24 hr tablet Take 1 tablet (30 mg total) by mouth daily.  [name] losartan (COZAAR) 100 MG tablet Take 1 tablet (100 mg total) by mouth daily.  . metformin (FORTAMET) 1000 MG (OSM) 24 hr tablet Take 2 tablets (2,000 mg total) by mouth daily with breakfast. (Patient taking differently: Take 2,000 mg by mouth 2 (two) times daily with a meal. )  . omeprazole (PRILOSEC) 40 MG capsule Take 1 capsule (40 mg total) by mouth 2 (two) times daily before a meal.  . rivaroxaban (XARELTO) 20 MG TABS tablet Take 1 tablet (20 mg total) by mouth daily with breakfast. (Patient taking differently: Take 20 mg by mouth daily with supper. )  . spironolactone (ALDACTONE) 50 MG tablet Take 1 tablet (50 mg total) by mouth daily.  [name] torsemide (DEMADEX) 20 MG tablet Take 2.5 tablets (50 mg total) by mouth 2 (two) times daily. (Patient taking differently: Take 100 mg by mouth at bedtime. )   No facility-administered encounter medications on file as of 07/20/2019.     Goals Addressed            This Visit's Progress   . "I want to stay home but could use some help" (pt-stated)       CARE PLAN ENTRY (see longtitudinal plan of care for additional care plan information)  Current Barriers:  . ADL IADL limitations   Social Work Clinical Goal(s):  .  Over the next 30 days, patient will work with SW to address concerns related to personal care services.    Interventions: . Collaborated with RN Case Manager re: level of care/request for personal care services.   Nash Dimmer with provider about completion of pcs request form.  Will fax to [employer] once completed.    Patient Self Care Activities:  . Patient able to perform MOST ADL's independently  . Does not adhere to prescribed medication regimen . Unable to perform all ADLs independently  Please see past updates related to this goal by clicking on the "Past  Updates" button in the selected goal          Follow Up Plan: SW will follow up with patient by phone over the next 14 days      [place], [place] Worker [place] [phone]

## 2019-07-20 NOTE — Progress Notes (Signed)
Internal Medicine Clinic Resident   I have personally reviewed this encounter including the documentation in this note and/or discussed this patient with the care management provider. I will address any urgent items identified by the care management provider and will communicate my actions to the patient's PCP. I have reviewed the patient's CCM visit with my supervising attending.  Neva Seat, MD 07/20/2019

## 2019-07-20 NOTE — Progress Notes (Signed)
Internal Medicine Clinic Attending  CCM services provided by the care management provider and their documentation were discussed with Dr. Trilby Drummer at the time of the visit.  We reviewed the pertinent findings, urgent action items addressed by the resident and non-urgent items to be addressed by the PCP.  I agree with the assessment, diagnosis, and plan of care documented in the CCM and resident's note.  Larey Dresser, MD 07/20/2019

## 2019-07-20 NOTE — Telephone Encounter (Signed)
Opened in error

## 2019-07-20 NOTE — Patient Instructions (Signed)
Visit Information  Goals Addressed            This Visit's Progress     Patient Stated   . " I haven't been able to sleep lying flat for a long time" (pt-stated)       CARE PLAN ENTRY (see longitudinal plan of care for additional care plan information)   Current Barriers:  [name] Knowledge deficit related to basic heart failure pathophysiology and self care management . Knowledge Deficits related to heart failure medications . Patient does not have readable scale  Case Manager Clinical Goal(s):  [name] Over the next 30 days, patient will weigh self daily and record . Over the next 30 days, patient will verbalize understanding of Heart Failure Action Plan and when to call doctor . Over the next 30 days, patient will take all Heart Failure mediations as prescribed . Over the next 30 days, patient will weigh daily and record (notifying MD of 3 lb weight gain over night or 5 lb in a week)  Interventions:  . Provided verbal education on low sodium diet . Assessed need for readable accurate scales in home . Reviewed role of diuretics in prevention of fluid overload and management of heart failure . Requested Rx from provider for scales and blood pressure monitor  Patient Self Care Activities:  . Takes Heart Failure Medications as prescribed . Weighs daily and record (notifying MD of 3 lb weight gain over night or 5 lb in a week) . Verbalizes understanding of and follows CHF Action Plan . Adheres to low sodium diet  Initial goal documentation     . " the gabapentin does not help my leg pain, it's unbearable at times and I can't care for myself I hurt so bad' (pt-stated)       [place] (see longitudinal plan of care for additional care plan information)  Current Barriers:  [name] Knowledge Deficits related to pain management- patient reports pain is interfering with his ability to sleep and care for himself. He says he wants to remain in his home (refused offer of investigating ALF/rest  home) even though he has self care needs  Nurse Case Manager Clinical Goal(s):  [name] Over the next 30 days, patient will verbalizes or demonstrate relief or control of pain and demonstrate increased ability for self care   Interventions:  . Completed pain assessment . Reviewed pain medications with patient and discussed efficacy . Encouraged patient to make appointment with provider to address ongoing pain issues and for consideration of referral to pain management clinic/chiropractor/etc . Mailed patient Emmi education articles on 07/18/19 on diabetic neuropathy, and peripheral neuropathy  Patient Self Care Activities:  . Patient verbalizes understanding of plan to discuss pain with provider . Unable to self administer medications as prescribed . Unable to perform ADLs independently . Unable to perform IADLs independently  Please see past updates related to this goal by clicking on the "Past Updates" button in the selected goal      . "i need help with my leg pain" (pt-stated)       CARE PLAN ENTRY (see longitudinal plan of care for additional care plan information)  Current Barriers:  . Chronic Disease Management support, education, and care coordination needs related to CHF and DMII- patient reports difficulty with managing ADL's/IADL's due to fatigue and ongoing leg pain that interrupts his sleep. States Gabapentin is ineffective in treating the leg pain.. Patient states he does not have a working scale and would like a  wrist BP monitor as he is currently borrowing someone's.  Clinical Goal(s) related to CHF and DMII:  Over the next 30 days, patient will:  . Work with the care management team to address educational, disease management, and care coordination needs  . Begin or continue self health monitoring activities as directed today Measure and record CBG (blood glucose) at least one time daily and Measure and record blood pressure one time daily . Call provider office for new or  worsened signs and symptoms Blood glucose findings outside established parameters, Blood pressure findings outside established parameters, Chest pain, Shortness of breath, and New or worsened symptom related to HF and DM II . Call care management team with questions or concerns . Verbalize basic understanding of patient centered plan of care established today  Interventions related to CHF and DMII:  . Evaluation of current treatment plans and patient's adherence to plan as established by provider . Assessed patient understanding of disease states . Assessed patient's education and care coordination needs . Provided disease specific education to patient  . Collaborated with appropriate clinical care team members regarding patient needs- requested Rx from MD for blood pressure cuff and scales .  Patient Self Care Activities related to CHF and DMII:  . Patient is unable to independently self-manage chronic health conditions  Please see past updates related to this goal by clicking on the "Past Updates" button in the selected goal      . "I want to stay home but could use some help" (pt-stated)       [place] (see longitudinal plan of care for additional care plan information)  Current Barriers:  . ADL IADL limitations   Social Work Clinical Goal(s):  [name] Over the next 30 days, patient will work with SW to address concerns related to personal care services.    Interventions: . Collaborated with RN Case Manager re: level of care/request for personal care services.   Nash Dimmer with provider about completion of pcs request form.  Will fax to [employer] once completed.    Patient Self Care Activities:  . Patient able to perform MOST ADL's independently  . Does not adhere to prescribed medication regimen . Unable to perform all ADLs independently  Please see past updates related to this goal by clicking on the "Past Updates" button in the selected goal        Other   .  Blood Pressure < 130/80       Reviewed HTN self management strategies: . Take medications as prescribed . Monitor your blood pressure at home- patient states he is currently using a wrist monitor that a friend loaned him as the cuff on his arm machine is too small and the machine is too awkward to use . Follow a low salt diet- limit consumption of fast foods, packaged foods . Get regular exercise . Maintain a healthy weight . Manage stress      . HEMOGLOBIN A1C < 7.0       Reviewed diabetes self management actions:  Glucose monitoring per provider recommendations patient reports fasting CBG today is 178- says he had sherbet as bedtime snack  Take medications as directed- patient states he is taking medications as prescribed  Perform Quality checks on blood meter if suspicious of readings  Eat Healthy- Reviewed plate method - patient states he has stopped drinking sweetened juices but does have a sherbet bedtime snack occasionally, counseled on portion control and label reading to include limiting foods/drinks with  more than 10 gm of sugar per serving, mailed patient Emmi information on how to read new food label on 07/17/19  Check feet daily- notify MD for non healing sores- patient states he checks feet daily  Visit provider every 3-6 months as directed  Hbg A1C level every 3-6 months.  Eye Exam yearly- reminded patient he is overdue for eye exam  Mailed patient [place] education articles on 07/18/19 on how to prevent high blood sugar emergencies in diabetics       The patient verbalized understanding of instructions provided today and declined a print copy of patient instruction materials.   The care management team will reach out to the patient again over the next 30 days.   [name] RN, CCM, [facility] RN Care Manager [phone]

## 2019-07-20 NOTE — Chronic Care Management (AMB) (Signed)
Care Management   Follow Up Note   07/20/2019 Name: Terry Parks MRN: [id number] DOB: [birth year]  Referred by: [name], MD Reason for referral : Care Coordination (HF, DM - RN follow up call)   Terry Parks is a 64 y.o. year old male who is a primary care patient of [name], [name], MD. The care management team was consulted for assistance with care management and care coordination needs.    Review of patient status, including review of consultants reports, relevant laboratory and other test results, and collaboration with appropriate care team members and the patient's provider was performed as part of comprehensive patient evaluation and provision of chronic care management services.    SDOH (Social Determinants of Health) assessments performed: No See Care Plan activities for detailed interventions related to [facility])     Advanced Directives: See Care Plan and Vynca application for related entries.    Goals      Patient Stated   . " I haven't been able to sleep lying flat for a long time" (pt-stated)     CARE PLAN ENTRY (see longitudinal plan of care for additional care plan information)   Current Barriers:  [name] Knowledge deficit related to basic heart failure pathophysiology and self care management . Knowledge Deficits related to heart failure medications . Patient does not have readable scale  Case Manager Clinical Goal(s):  [name] Over the next 30 days, patient will weigh self daily and record . Over the next 30 days, patient will verbalize understanding of Heart Failure Action Plan and when to call doctor . Over the next 30 days, patient will take all Heart Failure mediations as prescribed . Over the next 30 days, patient will weigh daily and record (notifying MD of 3 lb weight gain over night or 5 lb in a week)  Interventions:  . Provided verbal education on low sodium diet . Assessed need for readable accurate scales in home . Reviewed role of diuretics in  prevention of fluid overload and management of heart failure . Requested Rx from provider for scales and blood pressure monitor  Patient Self Care Activities:  . Takes Heart Failure Medications as prescribed . Weighs daily and record (notifying MD of 3 lb weight gain over night or 5 lb in a week) . Verbalizes understanding of and follows CHF Action Plan . Adheres to low sodium diet  Initial goal documentation     . " the gabapentin does not help my leg pain, it's unbearable at times and I can't care for myself I hurt so bad' (pt-stated)     Terry Parks (see longitudinal plan of care for additional care plan information)  Current Barriers:  [name] Knowledge Deficits related to pain management- patient reports pain is interfering with his ability to sleep and care for himself. He says he wants to remain in his home (refused offer of investigating ALF/rest home) even though he has self care needs  Nurse Case Manager Clinical Goal(s):  [name] Over the next 30 days, patient will verbalizes or demonstrate relief or control of pain and demonstrate increased ability for self care   Interventions:  . Completed pain assessment . Reviewed pain medications with patient and discussed efficacy . Encouraged patient to make appointment with provider to address ongoing pain issues and for consideration of referral to pain management clinic/chiropractor/etc . Mailed patient Emmi education articles on 07/18/19 on diabetic neuropathy, and peripheral neuropathy  Patient Self Care Activities:  . Patient verbalizes understanding of plan to discuss  pain with provider . Unable to self administer medications as prescribed . Unable to perform ADLs independently . Unable to perform IADLs independently  Please see past updates related to this goal by clicking on the "Past Updates" button in the selected goal      . "i need help with my leg pain" (pt-stated)     CARE PLAN ENTRY (see longitudinal plan of care for  additional care plan information)  Current Barriers:  . Chronic Disease Management support, education, and care coordination needs related to CHF and DMII- patient reports difficulty with managing ADL's/IADL's due to fatigue and ongoing leg pain that interrupts his sleep. States Gabapentin is ineffective in treating the leg pain.. Patient states he does not have a working scale and would like a wrist BP monitor as he is currently borrowing someone's.  Clinical Goal(s) related to CHF and DMII:  Over the next 30 days, patient will:  . Work with the care management team to address educational, disease management, and care coordination needs  . Begin or continue self health monitoring activities as directed today Measure and record CBG (blood glucose) at least one time daily and Measure and record blood pressure one time daily . Call provider office for new or worsened signs and symptoms Blood glucose findings outside established parameters, Blood pressure findings outside established parameters, Chest pain, Shortness of breath, and New or worsened symptom related to HF and DM II . Call care management team with questions or concerns . Verbalize basic understanding of patient centered plan of care established today  Interventions related to CHF and DMII:  . Evaluation of current treatment plans and patient's adherence to plan as established by provider . Assessed patient understanding of disease states . Assessed patient's education and care coordination needs . Provided disease specific education to patient  . Collaborated with appropriate clinical care team members regarding patient needs- requested Rx from MD for blood pressure cuff and scales .  Patient Self Care Activities related to CHF and DMII:  . Patient is unable to independently self-manage chronic health conditions  Please see past updates related to this goal by clicking on the "Past Updates" button in the selected goal      . "I  want to stay home but could use some help" (pt-stated)     [name] (see longitudinal plan of care for additional care plan information)  Current Barriers:  . ADL IADL limitations   Social Work Clinical Goal(s):  [name] Over the next 30 days, patient will work with SW to address concerns related to personal care services.    Interventions: . Collaborated with RN Case Manager re: level of care/request for personal care services.   Nash Dimmer with provider about completion of pcs request form.  Will fax to [employer] once completed.    Patient Self Care Activities:  . Patient able to perform MOST ADL's independently  . Does not adhere to prescribed medication regimen . Unable to perform all ADLs independently  Please see past updates related to this goal by clicking on the "Past Updates" button in the selected goal        Other   . Blood Pressure < 130/80     Reviewed HTN self management strategies: . Take medications as prescribed . Monitor your blood pressure at home- patient states he is currently using a wrist monitor that a friend loaned him as the cuff on his arm machine is too small and the machine is too  awkward to use . Follow a low salt diet- limit consumption of fast foods, packaged foods . Get regular exercise . Maintain a healthy weight . Manage stress      . Decrease soda intake    . HEMOGLOBIN A1C < 7.0     Reviewed diabetes self management actions:  Glucose monitoring per provider recommendations patient reports fasting CBG today is 178- says he had sherbet as bedtime snack  Take medications as directed- patient states he is taking medications as prescribed  Perform Quality checks on blood meter if suspicious of readings  Eat Healthy- Reviewed plate method - patient states he has stopped drinking sweetened juices but does have a sherbet bedtime snack occasionally, counseled on portion control and label reading to include limiting foods/drinks  with more than 10 gm of sugar per serving, mailed patient Emmi information on how to read new food label on 07/17/19  Check feet daily- notify MD for non healing sores- patient states he checks feet daily  Visit provider every 3-6 months as directed  Hbg A1C level every 3-6 months.  Eye Exam yearly- reminded patient he is overdue for eye exam  Mailed patient Emmi education articles on 07/18/19 on how to prevent high blood sugar emergencies in diabetics    . LDL CALC < 100     Per chart review - most recent lipid panel drawn 08/22/17        The care management team will reach out to the patient again over the next 30 days.   [name] RN, CCM, [facility] RN Care Manager [phone]

## 2019-07-20 NOTE — Progress Notes (Addendum)
Internal Medicine Clinic Resident   I have personally reviewed this encounter including the documentation in this note and/or discussed this patient with the care management provider. I will address any urgent items identified by the care management provider and will communicate my actions to the patient's PCP. I have reviewed the patient's CCM visit with my supervising attending.  I will send message to PCP regarding need for BP cuff.  Neva Seat, MD 07/20/2019

## 2019-07-20 NOTE — Patient Instructions (Signed)
Social Worker Visit Information  Goals we discussed today:  Goals Addressed            This Visit's Progress   . "I want to stay home but could use some help" (pt-stated)       CARE PLAN ENTRY (see longtitudinal plan of care for additional care plan information)  Current Barriers:  . ADL IADL limitations   Social Work Clinical Goal(s):  [name] Over the next 30 days, patient will work with SW to address concerns related to personal care services.    Interventions: . Collaborated with RN Case Manager re: level of care/request for personal care services.   Nash Dimmer with provider about completion of pcs request form.  Will fax to [employer] once completed.    Patient Self Care Activities:  . Patient able to perform MOST ADL's independently  . Does not adhere to prescribed medication regimen . Unable to perform all ADLs independently  Please see past updates related to this goal by clicking on the "Past Updates" button in the selected goal          Materials Provided: No. Patient not reached.  Follow Up Plan: SW will follow up with patient by phone over the next 14 days     [place], [place] Worker [place] [phone]

## 2019-07-23 NOTE — Progress Notes (Signed)
Internal Medicine Clinic Resident   I have personally reviewed this encounter including the documentation in this note and/or discussed this patient with the care management provider. I will address any urgent items identified by the care management provider and will communicate my actions to the patient's PCP. I have reviewed the patient's CCM visit with my supervising attending.  Neva Seat, MD 07/23/2019

## 2019-07-23 NOTE — Progress Notes (Signed)
Internal Medicine Clinic Attending  Case discussed with Dr. [name] at the time of the visit.  We reviewed the resident's history and exam and pertinent patient test results.  I agree with the assessment, diagnosis, and plan of care documented in the resident's note. 

## 2019-07-24 NOTE — Progress Notes (Signed)
Internal Medicine Clinic Attending  Case discussed with Dr. [name] at the time of the visit.  We reviewed the resident's history and exam and pertinent patient test results.  I agree with the assessment, diagnosis, and plan of care documented in the resident's note. 

## 2019-07-25 ENCOUNTER — Telehealth: Payer: Self-pay | Admitting: *Deleted

## 2019-07-25 NOTE — Telephone Encounter (Signed)
Phone call message received from the case manager that the patient is in need of a new scale for home. One has been left at the front for him.

## 2019-07-26 NOTE — Addendum Note (Signed)
Addended by: Hulan Fray on: 07/26/2019 02:18 PM   Modules accepted: Orders

## 2019-08-02 ENCOUNTER — Ambulatory Visit: Payer: Self-pay

## 2019-08-02 DIAGNOSIS — E1159 Type 2 diabetes mellitus with other circulatory complications: Secondary | ICD-10-CM

## 2019-08-02 DIAGNOSIS — E1169 Type 2 diabetes mellitus with other specified complication: Secondary | ICD-10-CM

## 2019-08-02 DIAGNOSIS — E785 Hyperlipidemia, unspecified: Secondary | ICD-10-CM

## 2019-08-02 NOTE — Progress Notes (Signed)
Internal Medicine Clinic Resident   I have personally reviewed this encounter including the documentation in this note and/or discussed this patient with the care management provider. I will address any urgent items identified by the care management provider and will communicate my actions to the patient's PCP. I have reviewed the patient's CCM visit with my supervising attending.  Ikeya Brockel, MD 08/02/2019    

## 2019-08-02 NOTE — Patient Instructions (Signed)
Visit Information  Goals Addressed            This Visit's Progress   . "I want to stay home but could use some help" (pt-stated)       CARE PLAN ENTRY (see longitudinal plan of care for additional care plan information)  Current Barriers:  . ADL IADL limitations   Social Work Clinical Goal(s):  [name] Over the next 30 days, patient will work with SW to address concerns related to personal care services.    Interventions: . Received completed request for PCS from provider, however, correction is needed.  . In basket message sent to provider regarding need for correction.  Form placed in providers box at practice.   . Will fax to [facility] once corrected form is received.      Patient Self Care Activities:  . Patient able to perform MOST ADL's independently  . Does not adhere to prescribed medication regimen . Unable to perform all ADLs independently  Please see past updates related to this goal by clicking on the "Past Updates" button in the selected goal         Patient verbalizes understanding of instructions provided today.   Telephone follow up appointment with Care Management Social Worker scheduled for 08/03/19.   Ronn Melena, [place] Coordination Social Worker [place] [phone]

## 2019-08-02 NOTE — Chronic Care Management (AMB) (Signed)
  Care Management   Follow Up Note   08/02/2019 Name: Terry Parks MRN: [id number] DOB: [birth year]  Referred by: [name], MD Reason for referral : Care Coordination (PCS)   Terry Parks is a 64 y.o. year old male who is a primary care patient of [name], [name], MD. The care management team was consulted for assistance with care management and care coordination needs.    Review of patient status, including review of consultants reports, relevant laboratory and other test results, and collaboration with appropriate care team members and the patient's provider was performed as part of comprehensive patient evaluation and provision of chronic care management services.    SDOH (Social Determinants of Health) assessments performed: No See Care Plan activities for detailed interventions related to [facility])     Advanced Directives: See Care Plan and Vynca application for related entries.   Goals Addressed            This Visit's Progress   . "I want to stay home but could use some help" (pt-stated)       CARE PLAN ENTRY (see longitudinal plan of care for additional care plan information)  Current Barriers:  . ADL IADL limitations   Social Work Clinical Goal(s):  [name] Over the next 30 days, patient will work with SW to address concerns related to personal care services.    Interventions: . Received completed request for PCS from provider, however, correction is needed.  . In basket message sent to provider regarding need for correction.  Form placed in providers box at practice.   . Will fax to [facility] once corrected form is received.      Patient Self Care Activities:  . Patient able to perform MOST ADL's independently  . Does not adhere to prescribed medication regimen . Unable to perform all ADLs independently  Please see past updates related to this goal by clicking on the "Past Updates" button in the selected goal          Telephone follow up  appointment with Care Management Social Worker scheduled for 08/03/19.      Ronn Melena, [place] Coordination Social Worker [place] [phone]

## 2019-08-03 ENCOUNTER — Ambulatory Visit: Payer: Self-pay

## 2019-08-03 ENCOUNTER — Telehealth: Payer: Medicaid Other

## 2019-08-03 DIAGNOSIS — E785 Hyperlipidemia, unspecified: Secondary | ICD-10-CM

## 2019-08-03 DIAGNOSIS — E1142 Type 2 diabetes mellitus with diabetic polyneuropathy: Secondary | ICD-10-CM

## 2019-08-03 DIAGNOSIS — E1169 Type 2 diabetes mellitus with other specified complication: Secondary | ICD-10-CM

## 2019-08-03 NOTE — Progress Notes (Signed)
Internal Medicine Clinic Resident   I have personally reviewed this encounter including the documentation in this note and/or discussed this patient with the care management provider. I will address any urgent items identified by the care management provider and will communicate my actions to the patient's PCP. I have reviewed the patient's CCM visit with my supervising attending.  Coni Homesley, MD 08/03/2019    

## 2019-08-03 NOTE — Patient Instructions (Signed)
Visit Information  Goals Addressed            This Visit's Progress   . "I want to stay home but could use some help" (pt-stated)       CARE PLAN ENTRY (see longitudinal plan of care for additional care plan information)  Current Barriers:  . ADL IADL limitations   Social Work Clinical Goal(s):  [name] Over the next 30 days, patient will work with SW to address concerns related to personal care services.    Interventions: . Collaborated with Dr. Maricela Bo regarding request for [place].  Patient deemed medically unstable due to Diabetes not being managed.  Patient must be deemed medically stable to be assessed for Personal Care Services.   . Attempted to contact patient today to discuss but had to leave message.    Patient Self Care Activities:  . Patient able to perform MOST ADL's independently  . Does not adhere to prescribed medication regimen . Unable to perform all ADLs independently  Please see past updates related to this goal by clicking on the "Past Updates" button in the selected goal          The care management team will reach out to the patient again over the next 10 days.    Ronn Melena, [place] Coordination Social Worker [place] [phone]

## 2019-08-03 NOTE — Chronic Care Management (AMB) (Signed)
  Care Management   Follow Up Note   08/03/2019 Name: Terry Parks MRN: [id number] DOB: 25-May-1955  Referred by: Terry Mage, MD Reason for referral : Care Coordination (PCS)   Terry Parks is a 64 y.o. year old male who is a primary care patient of [name], Terry Spurr, MD. The care management team was consulted for assistance with care management and care coordination needs.    Review of patient status, including review of consultants reports, relevant laboratory and other test results, and collaboration with appropriate care team members and the patient's provider was performed as part of comprehensive patient evaluation and provision of chronic care management services.    SDOH (Social Determinants of Health) assessments performed: No See Care Plan activities for detailed interventions related to Terry Parks)     Advanced Directives: See Care Plan and Vynca application for related entries.   Goals Addressed            This Visit's Progress   . "I want to stay home but could use some help" (pt-stated)       CARE PLAN ENTRY (see longitudinal plan of care for additional care plan information)  Current Barriers:  . ADL IADL limitations   Social Work Clinical Goal(s):  Terry Parks Kitchen Over the next 30 days, patient will work with SW to address concerns related to personal care services.    Interventions: . Collaborated with Dr. Maricela Parks regarding request for Terry Parks.  Patient deemed medically unstable due to Diabetes not being managed.  Patient must be deemed medically stable to be assessed for Personal Care Services.   . Attempted to contact patient today to discuss but had to leave message.    Patient Self Care Activities:  . Patient able to perform MOST ADL's independently  . Does not adhere to prescribed medication regimen . Unable to perform all ADLs independently  Please see past updates related to this goal by clicking on the "Past Updates" button in the selected goal           The care management team will reach out to the patient again over the next 10 days.     Terry Parks, [place] Coordination Social Worker [place] [phone]

## 2019-08-06 NOTE — Progress Notes (Signed)
[facility] Medicine Clinic Attending  CCM services provided by the care management provider and their documentation were reviewed with Dr. Benjamine Mola shortly after the visit.  I have reviewed the pertinent findings, urgent action items addressed by the resident and non-urgent items to be addressed by the PCP.  I agree with the assessment, diagnosis, and plan of care documented in the CCM and resident's note.  Oda Kilts, MD 08/06/2019

## 2019-08-08 NOTE — Progress Notes (Signed)
Internal Medicine Clinic Attending  CCM services provided by the care management provider and their documentation were discussed with Dr. Benjamine Mola soon after the resident saw the patient.  We reviewed the pertinent findings, urgent action items addressed by the resident and non-urgent items to be addressed by the PCP.  I agree with the assessment, diagnosis, and plan of care documented in the CCM and resident's note.  Velna Ochs, MD 08/08/2019

## 2019-08-09 ENCOUNTER — Telehealth: Payer: Self-pay

## 2019-08-09 DIAGNOSIS — R32 Unspecified urinary incontinence: Secondary | ICD-10-CM | POA: Diagnosis not present

## 2019-08-09 NOTE — Telephone Encounter (Signed)
08/09/2019 Spoke with patient about Medicaid transportation and dental appointment.  Patient has his Medicaid transportation and is working on contacting a Pharmacist, community he was given a Proofreader that accept Medicaid. Patient mentioned that he needed to see someone for depression I told him I would send his request to the nurse.  Patient does not have any other needs. Closing referral Terry Parks [phone]

## 2019-08-10 ENCOUNTER — Telehealth: Payer: Self-pay | Admitting: Physician Assistant

## 2019-08-10 ENCOUNTER — Ambulatory Visit: Payer: Medicaid Other

## 2019-08-10 ENCOUNTER — Ambulatory Visit (INDEPENDENT_AMBULATORY_CARE_PROVIDER_SITE_OTHER): Payer: Medicaid Other | Admitting: Physician Assistant

## 2019-08-10 ENCOUNTER — Encounter: Payer: Self-pay | Admitting: Physician Assistant

## 2019-08-10 ENCOUNTER — Encounter (HOSPITAL_COMMUNITY): Payer: Self-pay | Admitting: Emergency Medicine

## 2019-08-10 ENCOUNTER — Telehealth: Payer: Self-pay | Admitting: Internal Medicine

## 2019-08-10 ENCOUNTER — Other Ambulatory Visit: Payer: Self-pay

## 2019-08-10 ENCOUNTER — Telehealth: Payer: Medicaid Other

## 2019-08-10 ENCOUNTER — Inpatient Hospital Stay (HOSPITAL_COMMUNITY)
Admission: EM | Admit: 2019-08-10 | Discharge: 2019-08-14 | DRG: 291 | Disposition: A | Discharge status: Home / Self Care | Payer: Medicaid Other | Attending: Student in an Organized Health Care Education/Training Program | Admitting: Student in an Organized Health Care Education/Training Program

## 2019-08-10 ENCOUNTER — Emergency Department (HOSPITAL_COMMUNITY): Payer: Medicaid Other

## 2019-08-10 VITALS — BP 162/82 | HR 80 | Ht 73.0 in | Wt 330.2 lb

## 2019-08-10 DIAGNOSIS — E785 Hyperlipidemia, unspecified: Secondary | ICD-10-CM | POA: Diagnosis not present

## 2019-08-10 DIAGNOSIS — E871 Hypo-osmolality and hyponatremia: Secondary | ICD-10-CM

## 2019-08-10 DIAGNOSIS — Z20822 Contact with and (suspected) exposure to covid-19: Secondary | ICD-10-CM | POA: Diagnosis present

## 2019-08-10 DIAGNOSIS — N400 Enlarged prostate without lower urinary tract symptoms: Secondary | ICD-10-CM | POA: Diagnosis present

## 2019-08-10 DIAGNOSIS — N189 Chronic kidney disease, unspecified: Secondary | ICD-10-CM

## 2019-08-10 DIAGNOSIS — E119 Type 2 diabetes mellitus without complications: Secondary | ICD-10-CM | POA: Diagnosis not present

## 2019-08-10 DIAGNOSIS — R609 Edema, unspecified: Secondary | ICD-10-CM

## 2019-08-10 DIAGNOSIS — Z888 Allergy status to other drugs, medicaments and biological substances status: Secondary | ICD-10-CM

## 2019-08-10 DIAGNOSIS — I5043 Acute on chronic combined systolic (congestive) and diastolic (congestive) heart failure: Secondary | ICD-10-CM

## 2019-08-10 DIAGNOSIS — Z79899 Other long term (current) drug therapy: Secondary | ICD-10-CM

## 2019-08-10 DIAGNOSIS — G4733 Obstructive sleep apnea (adult) (pediatric): Secondary | ICD-10-CM | POA: Diagnosis present

## 2019-08-10 DIAGNOSIS — I4819 Other persistent atrial fibrillation: Secondary | ICD-10-CM | POA: Diagnosis present

## 2019-08-10 DIAGNOSIS — E1165 Type 2 diabetes mellitus with hyperglycemia: Secondary | ICD-10-CM | POA: Diagnosis present

## 2019-08-10 DIAGNOSIS — N289 Disorder of kidney and ureter, unspecified: Secondary | ICD-10-CM | POA: Diagnosis not present

## 2019-08-10 DIAGNOSIS — E1122 Type 2 diabetes mellitus with diabetic chronic kidney disease: Secondary | ICD-10-CM | POA: Diagnosis present

## 2019-08-10 DIAGNOSIS — R079 Chest pain, unspecified: Secondary | ICD-10-CM | POA: Diagnosis not present

## 2019-08-10 DIAGNOSIS — Z87442 Personal history of urinary calculi: Secondary | ICD-10-CM

## 2019-08-10 DIAGNOSIS — Z8249 Family history of ischemic heart disease and other diseases of the circulatory system: Secondary | ICD-10-CM

## 2019-08-10 DIAGNOSIS — Z803 Family history of malignant neoplasm of breast: Secondary | ICD-10-CM

## 2019-08-10 DIAGNOSIS — F329 Major depressive disorder, single episode, unspecified: Secondary | ICD-10-CM | POA: Diagnosis present

## 2019-08-10 DIAGNOSIS — L280 Lichen simplex chronicus: Secondary | ICD-10-CM | POA: Diagnosis present

## 2019-08-10 DIAGNOSIS — Z7901 Long term (current) use of anticoagulants: Secondary | ICD-10-CM

## 2019-08-10 DIAGNOSIS — E1142 Type 2 diabetes mellitus with diabetic polyneuropathy: Secondary | ICD-10-CM | POA: Diagnosis present

## 2019-08-10 DIAGNOSIS — I5023 Acute on chronic systolic (congestive) heart failure: Secondary | ICD-10-CM | POA: Diagnosis present

## 2019-08-10 DIAGNOSIS — Z794 Long term (current) use of insulin: Secondary | ICD-10-CM

## 2019-08-10 DIAGNOSIS — K30 Functional dyspepsia: Secondary | ICD-10-CM | POA: Diagnosis not present

## 2019-08-10 DIAGNOSIS — Z833 Family history of diabetes mellitus: Secondary | ICD-10-CM

## 2019-08-10 DIAGNOSIS — Z6841 Body Mass Index (BMI) 40.0 and over, adult: Secondary | ICD-10-CM

## 2019-08-10 DIAGNOSIS — Z8546 Personal history of malignant neoplasm of prostate: Secondary | ICD-10-CM

## 2019-08-10 DIAGNOSIS — N183 Chronic kidney disease, stage 3 unspecified: Secondary | ICD-10-CM | POA: Diagnosis present

## 2019-08-10 DIAGNOSIS — I480 Paroxysmal atrial fibrillation: Secondary | ICD-10-CM

## 2019-08-10 DIAGNOSIS — I10 Essential (primary) hypertension: Secondary | ICD-10-CM | POA: Diagnosis not present

## 2019-08-10 DIAGNOSIS — I429 Cardiomyopathy, unspecified: Secondary | ICD-10-CM | POA: Diagnosis present

## 2019-08-10 DIAGNOSIS — I4811 Longstanding persistent atrial fibrillation: Secondary | ICD-10-CM | POA: Diagnosis present

## 2019-08-10 DIAGNOSIS — I130 Hypertensive heart and chronic kidney disease with heart failure and stage 1 through stage 4 chronic kidney disease, or unspecified chronic kidney disease: Principal | ICD-10-CM | POA: Diagnosis present

## 2019-08-10 DIAGNOSIS — N1831 Chronic kidney disease, stage 3a: Secondary | ICD-10-CM | POA: Diagnosis present

## 2019-08-10 DIAGNOSIS — J45909 Unspecified asthma, uncomplicated: Secondary | ICD-10-CM | POA: Diagnosis present

## 2019-08-10 LAB — PROTIME-INR
INR: 1.1 (ref 0.8–1.2)
Prothrombin Time: 13.9 seconds (ref 11.4–15.2)

## 2019-08-10 LAB — BRAIN NATRIURETIC PEPTIDE: B Natriuretic Peptide: 220.7 pg/mL — ABNORMAL HIGH (ref 0.0–100.0)

## 2019-08-10 LAB — CBC
HCT: 37.7 % — ABNORMAL LOW (ref 39.0–52.0)
Hemoglobin: 12 g/dL — ABNORMAL LOW (ref 13.0–17.0)
MCH: 27.8 pg (ref 26.0–34.0)
MCHC: 31.8 g/dL (ref 30.0–36.0)
MCV: 87.3 fL (ref 80.0–100.0)
Platelets: 200 10*3/uL (ref 150–400)
RBC: 4.32 MIL/uL (ref 4.22–5.81)
RDW: 13.9 % (ref 11.5–15.5)
WBC: 6.5 10*3/uL (ref 4.0–10.5)
nRBC: 0 % (ref 0.0–0.2)

## 2019-08-10 LAB — BASIC METABOLIC PANEL
Anion gap: 8 (ref 5–15)
BUN: 6 mg/dL — ABNORMAL LOW (ref 8–23)
CO2: 23 mmol/L (ref 22–32)
Calcium: 8.1 mg/dL — ABNORMAL LOW (ref 8.9–10.3)
Chloride: 111 mmol/L (ref 98–111)
Creatinine, Ser: 1.43 mg/dL — ABNORMAL HIGH (ref 0.61–1.24)
GFR calc Af Amer: >60 mL/min (ref >60)
GFR calc non Af Amer: 52 mL/min — ABNORMAL LOW (ref >60)
Glucose, Bld: 109 mg/dL — ABNORMAL HIGH (ref 70–99)
Potassium: 3.9 mmol/L (ref 3.5–5.1)
Sodium: 142 mmol/L (ref 135–145)

## 2019-08-10 LAB — TROPONIN I (HIGH SENSITIVITY): Troponin I (High Sensitivity): 21 ng/L — ABNORMAL HIGH (ref <18)

## 2019-08-10 MED ORDER — SODIUM CHLORIDE 0.9% FLUSH: 3.0000 mL | Freq: Once | INTRAVENOUS | Status: DC

## 2019-08-10 NOTE — Chronic Care Management (AMB) (Signed)
  Care Management   Follow Up Note   08/10/2019 Name: Terry Parks MRN: [id number] DOB: Jan 07, 1956  Referred by: [name], MD Reason for referral : Care Coordination (A Fib, HF, DM)   Doua Parks is a 64 y.o. year old male who is a primary care patient of [name], [name], MD. The care management team was consulted for assistance with care management and care coordination needs.    Review of patient status, including review of consultants reports, relevant laboratory and other test results, and collaboration with appropriate care team members and the patient's provider was performed as part of comprehensive patient evaluation and provision of chronic care management services.    SDOH (Social Determinants of Health) assessments performed: No See Care Plan activities for detailed interventions related to [facility])     Advanced Directives: See Care Plan and Vynca application for related entries.   Goals Addressed            This Visit's Progress     Patient Stated   . "I think I need to see my heart doctor because my legs are more swollen, I'm really tired and I'm more short of breath" (pt-stated)       CARE PLAN ENTRY (see longitudinal plan of care for additional care plan information)   Current Barriers:  [name] Knowledge deficit related to basic heart failure pathophysiology and self care management- pt is c/o increased bilateral leg edema, wheezing and extreme fatigue, he says he did not make a follow up appointment with his cardiologist as was discussed on the last call with this RNCM. He reports his fasting  CBG yesterday was 145 and states he is taking his medications as prescribed, he says his BP today is 155/93 and his pulse is 80 . Knowledge Deficits related to heart failure medications . Patient does not have readable scale- will get scales to patient ASAP  Case Manager Clinical Goal(s):  [name] Over the next 30 days, patient will weigh self daily and record . Over the  next 30 days, patient will verbalize understanding of Heart Failure Action Plan and when to call doctor . Over the next 30 days, patient will take all Heart Failure mediations as prescribed . Over the next 30 days, patient will weigh daily and record (notifying MD of 3 lb weight gain over night or 5 lb in a week)  Interventions:  . Spoke with patient - based on the sxs he is reporting today, he needs to be seen urgently by cardiologist . Called and secured appointment for patient with cardiology PA today at 2:30 pm . [facility] arranged urgent transportation via [facility] transportation and patient informed .   Patient Self Care Activities:  Takes Heart Failure Medications as prescribed . Weighs daily and record (notifying MD of 3 lb weight gain over night or 5 lb in a week) . Verbalizes understanding of and follows CHF Action Plan . Adheres to low sodium diet  Please see past updates related to this goal by clicking on the "Past Updates" button in the selected goal          The care management team will reach out to the patient again over the next 7 days.   [name] RN, CCM, [facility] RN Care Manager [phone]

## 2019-08-10 NOTE — Progress Notes (Addendum)
Cardiology Office Note:    Date:  08/10/2019   ID:  Terry Parks, DOB 03-04-1956, MRN [id number]  PCP:  [name], MD  Cardiologist:  [name], MD  Electrophysiologist:  None   Referring MD: [name], MD   Chief Complaint  Patient presents with  . Follow-up    dyspnea and swelling    History of Present Illness:    Terry Parks is a 64 y.o. male with a hx of combined systolic and diastolic heart failure, PAF on amiodarone, HTN, HLD, DM 2 and OSA.  He has never had a cardiac catheterization in the past.  Myoview obtained in 2013 showed fixed anterior defect.  He has a history of kidney stone in 2019.  Previous echocardiogram in January 2019 showed EF 30 to 35%.  The etiology behind his cardiomyopathy was not entirely clear however the degree of LV dysfunction is related to blood pressure control.  He was last seen by Dr. Sallyanne Kuster on 12/26/2018, spironolactone increased to 40 mg daily, carvedilol increased to 25 mg twice daily. His torsemide was increased from the previous 40 mg daily to 40 mg twice daily in September 2020 by PCP. Last echocardiogram obtained on 02/23/2019 showed EF 35 to 40%, moderate LVH, mild dilatation of left and right atrium.  In the past 6 months, there has been progressive worsening of renal function.  Creatinine was 1.3 about 6 months ago, and that it was 1.7 about 3 months ago, most recent lab work was obtained on 07/12/2019 that showed hyponatremia with sodium of 129, creatinine was 2.01.  Glucose level at the time was 586.  Hemoglobin was 12.7.  Patient presents today to cardiology office for evaluation worsening dyspnea and lower extremity edema along with abdominal distention.  His weight is 330 pounds.  His previous weight in February was 308 pounds.  I question the accuracy of this weight's previous weight was more closer to about 320 pounds.  He is obviously short of breath while in the room.  Fortunately his lung seems to be clear and the O2  saturation is 99%.  He has at least 3+ pitting edema on physical exam, and clearly appears to be volume overloaded.  I am quite concerned that diuresing him as outpatient will be very risky given worsening renal function.  I discussed the case with DOD Dr. Debara Pickett who recommended sending him to the ED to have him admitted by internal medicine teaching service and cardiology can be consulted on the side to help manage the fluid.  Internal medicine service will address hyponatremia, measured the renal function and also managed severe untreated diabetes.  During the hospitalization, since he has a history of post renal obstruction, I recommend obtaining a renal Doppler again to make sure his current volume accumulation is not due to post renal issue.  Past Medical History:  Diagnosis Date  . Abscessed tooth    top back large cavity no pain or drainage, one on bottom  pt pulled tooth 4-5 months ago, right top large hole in tooth  . AKI (acute kidney injury) ([place])   . Allergic rhinitis   . Anemia   . Anxiety   . Asthma   . Atrial fibrillation ([place])   . BPH (benign prostatic hypertrophy)    Massive BPH noted on cystoscopy 1/23/ 2012 by Dr. Risa Grill.  . Cancer [facility])    prostate cancer 2019  . Cardiomyopathy ([place])   . CHF (congestive heart failure) ([place])   . Cough 03/30/2012  .  Depression   . Diabetes mellitus 04/08/2008   type 2  . Foley catheter in place 07-05-17 placed  . Headache(784.0)    hx migraines none recent  . Hyperlipemia   . Hypertension   . Hypertensive cardiopathy 03/01/2006   2-D echocardiogram 02/01/2012 showed moderate LVH, mildly to moderately reduced left ventricular systolic function with an estimated ejection fraction of 40-45%, and diffuse hypokinesis.  A nuclear medicine stress study done 01/31/2012 showed no reversible ischemia, a small mid anterior wall fixed defect/infarct, and ejection fraction 42%.      . Neck pain   . Nephrolithiasis 05/29/2010   CT scan of abdomen/pelvis  on 05/29/2010 showed an obstructing approximate 1-2 mm calculus at the left UVJ, and an approximate 1-2 mm left lower pole renal calculus.   Patient had continuing severe pain , and an elevation of his serum creatinine to a value of 1.75 on 06/06/2010.  Patient underwent cystoscopy on 06/08/2010 by Dr. Risa Grill, but attempts at retrograde pyelogram and ureteroscopy were unsucc  . Numbness 01/08/2018  . Obstructive sleep apnea 03/06/2008   Sleep study 03/06/08 showed severe OSA/hypopnea syndrome, with successful CPAP titration to 13 CWP using a medium ResMed Mirage Quattro full face mask with heated humidifier.   . Rash 04/17/2014  . Renal calculus 05/29/2010   CT scan of abdomen/pelvis on 05/29/2010 showed an obstructing approximate 1-2 mm calculus at the left UVJ, and an approximate 1-2 mm left lower pole renal calculus.   Patient had continuing severe pain , and an elevation of his serum creatinine to a value of 1.75 on 06/06/2010.  The stone had apparently passed and was not seen on repeat CT 06/08/2010.  . Sleep apnea    haven't use cpap in 2 years  . Tooth pain 10/29/2017  . Urinary straining 11/02/2016    Past Surgical History:  Procedure Laterality Date  . big toe nails removed Bilateral 20 yrs ago  . CARDIOVERSION N/A 06/08/2017   Procedure: CARDIOVERSION;  Surgeon: Josue Hector, MD;  Location: [facility] ENDOSCOPY;  Service: Cardiovascular;  Laterality: N/A;  . COLONOSCOPY    . CYSTOSCOPY W/ RETROGRADES    . IR RADIOLOGIST EVAL & MGMT  02/02/2018  . LYMPHADENECTOMY Bilateral 10/06/2017   Procedure: LYMPHADENECTOMY;  Surgeon: Lucas Mallow, MD;  Location: [facility] ORS;  Service: Urology;  Laterality: Bilateral;  . ROBOT ASSISTED LAPAROSCOPIC RADICAL PROSTATECTOMY N/A 10/06/2017   Procedure: XI ROBOTIC ASSISTED LAPAROSCOPIC RADICAL PROSTATECTOMY;  Surgeon: Lucas Mallow, MD;  Location: [facility] ORS;  Service: Urology;  Laterality: N/A;  . TRANSURETHRAL RESECTION OF BLADDER TUMOR N/A 07/13/2017   Procedure:  TRANSURETHRAL RESECTION OF PROSTATE;  Surgeon: Lucas Mallow, MD;  Location: [facility] ORS;  Service: Urology;  Laterality: N/A;    Current Medications: Current Meds  Medication Sig  . Accu-Chek Softclix Lancets lancets Check blood sugar three times a day as instructed  . acetaminophen (TYLENOL) 325 MG tablet Take 325-650 mg by mouth every 6 (six) hours as needed (for pain).  [name] amiodarone (PACERONE) 200 MG tablet Take 1 tablet (200 mg total) by mouth daily.  . bisacodyl (DULCOLAX) 5 MG EC tablet Take 5 mg by mouth daily as needed for moderate constipation.  . Blood Glucose Monitoring Suppl (ACCU-CHEK AVIVA) device Use as instructed to check blood sugar up to 3 times a day  . carvedilol (COREG) 25 MG tablet Take 1 tablet (25 mg total) by mouth 2 (two) times daily with a meal.  . cholecalciferol (VITAMIN D3)  25 MCG (1000 UT) tablet Take 1 tablet (1,000 Units total) by mouth daily.  . DULoxetine (CYMBALTA) 60 MG capsule Take 1 capsule (60 mg total) by mouth daily.  [name] gabapentin (NEURONTIN) 300 MG capsule Take 1 capsule (300 mg total) by mouth 2 (two) times daily.  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 Insulin Degludec-Liraglutide (XULTOPHY) 100-3.6 UNIT-MG/ML SOPN Inject 50 Units into the skin daily.  . Insulin Pen Needle (CARETOUCH PEN NEEDLES) 31G X 6 MM MISC 1 pen by Does not apply route at bedtime.  . isosorbide mononitrate (IMDUR) 30 MG 24 hr tablet Take 1 tablet (30 mg total) by mouth daily.  [name] losartan (COZAAR) 100 MG tablet Take 1 tablet (100 mg total) by mouth daily.  . metformin (FORTAMET) 1000 MG (OSM) 24 hr tablet Take 2 tablets (2,000 mg total) by mouth daily with breakfast. (Patient taking differently: Take 2,000 mg by mouth 2 (two) times daily with a meal. )  . omeprazole (PRILOSEC) 40 MG capsule Take 1 capsule (40 mg total) by mouth 2 (two) times daily before a meal.  . rivaroxaban (XARELTO) 20 MG  TABS tablet Take 1 tablet (20 mg total) by mouth daily with breakfast. (Patient taking differently: Take 20 mg by mouth daily with supper. )  . spironolactone (ALDACTONE) 50 MG tablet Take 1 tablet (50 mg total) by mouth daily.  [name] torsemide (DEMADEX) 20 MG tablet Take 2.5 tablets (50 mg total) by mouth 2 (two) times daily. (Patient taking differently: Take 100 mg by mouth at bedtime. )     Allergies:   Lisinopril   Social History   Socioeconomic History  . Marital status: Single    Spouse name: Not on file  . Number of children: 4  . Years of education: 39  . Highest education level: Not on file  Occupational History  . Occupation:        Employer: UNEMPLOYED  Tobacco Use  . Smoking status: Never Smoker  . Smokeless tobacco: Never Used  Substance and Sexual Activity  . Alcohol use: No    Alcohol/week: 0.0 standard drinks  . Drug use: No  . Sexual activity: Not Currently  Other Topics Concern  . Not on file  Social History Narrative   Divorced, 4 children, lives alone.     Social Determinants of Health   Financial Resource Strain: Low Risk   . Difficulty of Paying Living Expenses: Not hard at all  Food Insecurity: No Food Insecurity  . Worried About Charity fundraiser in the Last Year: Never true  . Ran Out of Food in the Last Year: Never true  Transportation Needs: No Transportation Needs  . Lack of Transportation (Medical): No  . Lack of Transportation (Non-Medical): No  Physical Activity: Inactive  . Days of Exercise per Week: 0 days  . Minutes of Exercise per Session: 0 min  Stress:   . Feeling of Stress :   Social Connections: Unknown  . Frequency of Communication with Friends and Family: More than three times a week  . Frequency of Social Gatherings with Friends and Family: More than three times a week  . Attends Religious Services: More than 4 times per year  . Active Member of Clubs or Organizations: Not on file  . Attends Archivist Meetings: Not  on file  . Marital Status: Not on file     Family History: The patient's  family history includes Breast cancer in his mother; Diabetes in his maternal grandmother; Hypertension in his father. There is no history of Colon cancer, Prostate cancer, Heart attack, Esophageal cancer, Inflammatory bowel disease, Liver disease, Pancreatic cancer, Rectal cancer, or Stomach cancer.  ROS:   Please see the history of present illness.     All other systems reviewed and are negative.  EKGs/Labs/Other Studies Reviewed:    The following studies were reviewed today:  Echo 02/23/2019 IMPRESSIONS    1. Left ventricular ejection fraction, by visual estimation, is 35 to  40%. The left ventricle has moderately decreased function. Normal left  ventricular size. There is moderate left ventricular hypertrophy. There is  blobal left ventricular hypokinesis,  without regional variation.  2. Left ventricular diastolic Doppler parameters are consistent with  impaired relaxation pattern of LV diastolic filling.  3. Global right ventricle has mildly reduced systolic function.The right  ventricular size is not well visualized. Right vetricular wall thickness  was not assessed.  4. Left atrial size was mildly dilated.  5. Right atrial size was mildly dilated.  6. The aortic valve is normal in structure. Aortic valve regurgitation  was not visualized by color flow Doppler.  7. The aortic root was not well visualized.  8. TR signal is inadequate for assessing pulmonary artery systolic  pressure.  9. The inferior vena cava is normal in size with greater than 50%  respiratory variability, suggesting right atrial pressure of 3 mmHg.  10. The mitral valve is normal in structure. No evidence of mitral valve  regurgitation.  11. The tricuspid valve is normal in structure. Tricuspid valve  regurgitation was not visualized by color flow Doppler.  12. The pulmonic valve was not well visualized. Pulmonic valve    regurgitation is not visualized by color flow Doppler.   EKG:  EKG is ordered today.  The ekg ordered today demonstrates normal sinus rhythm, prolonged QT at 495 ms, otherwise no significant ST-T wave changes.  Recent Labs: 11/10/2018: Magnesium 1.9 12/26/2018: TSH 2.410 04/23/2019: ALT 24; Hemoglobin 14.2; Platelets 178 07/12/2019: BUN 17; Creatinine, Ser 2.01; Potassium 3.9; Sodium 129  Recent Lipid Panel    Component Value Date/Time   CHOL 101 01/09/2018 0509   CHOL 130 08/22/2017 1151   TRIG 57 01/09/2018 0509   HDL 26 (L) 01/09/2018 0509   HDL 39 (L) 08/22/2017 1151   CHOLHDL 3.9 01/09/2018 0509   VLDL 11 01/09/2018 0509   LDLCALC 64 01/09/2018 0509   LDLCALC 75 08/22/2017 1151    Physical Exam:    VS:  BP (!) 162/82   Pulse 80   Ht 6\' 1"  (1.854 m)   Wt (!) 330 lb 3.2 oz (149.8 kg)   SpO2 99%   BMI 43.56 kg/m     Wt Readings from Last 3 Encounters:  08/10/19 (!) 330 lb 3.2 oz (149.8 kg)  07/12/19 (!) 308 lb (139.7 kg)  06/01/19 (!) 320 lb (145.2 kg)     GEN:  Well nourished, well developed in no acute distress HEENT: Normal NECK: No JVD; No carotid bruits LYMPHATICS: No lymphadenopathy CARDIAC: RRR, no murmurs, rubs, gallops RESPIRATORY:  Clear to auscultation without rales, wheezing or rhonchi  ABDOMEN: Abdomen tight and distended MUSCULOSKELETAL:  3+ edema; No deformity  SKIN: Warm and dry NEUROLOGIC:  Alert and oriented x 3 PSYCHIATRIC:  Normal affect   ASSESSMENT:    1. Acute on chronic combined systolic and diastolic CHF (congestive heart failure) ([place])   2. PAF (paroxysmal atrial  fibrillation) ([place])   3. Essential hypertension   4. Hyperlipidemia LDL goal <70   5. Controlled type 2 diabetes mellitus without complication, without long-term current use of insulin ([place])   6. OSA (obstructive sleep apnea)   7. Hyponatremia   8. Acute on chronic renal insufficiency    PLAN:    In order of problems listed above:  1. Acute on chronic combined  systolic heart diastolic heart failure: He is clearly volume overloaded and with at least 3+ pitting edema.  Renal function is worsening based on the lab work recently.  Last creatinine was 2.0.  He also has a history of post renal obstruction as well.  Recommend a renal Doppler and start on IV diuretic (consider 120mg  BID IV lasix).  May also need PRN metolazone as well.  I discussed the case with DOD Dr. Debara Pickett who recommend sending the patient to the [facility] ED, due to multiple issues, it is recommended the patient become admitted by internal medicine residency service (followed by Dr. Maricela Bo) and cardiology service can be the consulting service.  -Patient has not been compliant with dietary restriction.  2. PAF: On amiodarone and Xarelto.  Given how bad his renal function is, he may qualify for 15 mg daily of Xarelto for the time being.  3. Hypertension: Blood pressure continue to be high.  He has history of uncontrolled hypertension.  Will focus on diuresis first and then address blood pressure.  4. Hyperlipidemia: Not on statin medication at this time for unknown reason.  5. DM 2: Glucoses not very well controlled despite insulin.  Recent lab work shows hemoglobin A1c was greater than 12.    6. Hyponatremia: We will defer to internal medicine residency service.    7. Acute on chronic renal insufficiency: Hold losartan and spironolactone.  Obtain renal Doppler ultrasound.  If renal function does not improve with diuresis, may need to consider right heart cath.   50 minutes has been spent talking with the patient and formulate treatment plan.  Final plan has been discussed with the patient who is agreeable to the go to the [facility], ED.   Medication Adjustments/Labs and Tests Ordered: Current medicines are reviewed at length with the patient today.  Concerns regarding medicines are outlined above.  Orders Placed This Encounter  Procedures  . EKG 12-Lead   No orders of the defined  types were placed in this encounter.   Patient Instructions  Medication Instructions:   GO DIRECTLY TO [facility] EMERGENCY DEPARTMENT *If you need a refill on your cardiac medications before your next appointment, please call your pharmacy*  Lab Work:  GO DIRECTLY TO [facility]  If you have labs (blood work) drawn today and your tests are completely normal, you will receive your results only by: [name] MyChart Message (if you have MyChart) OR . A paper copy in the mail If you have any lab test that is abnormal or we need to change your treatment, we will call you to review the results.  Testing/Procedures:  GO DIRECTLY TO [facility] EMERGENCY DEPARTMENT  Follow-Up: At [facility], you and your health needs are our priority.  As part of our continuing mission to provide you with exceptional heart care, we have created designated Provider Care Teams.  These Care Teams include your primary Cardiologist (physician) and Advanced Practice Providers (APPs -  Physician Assistants and Nurse Practitioners) who all work together to provide you with the care you need, when you need  it.  Your next appointment:     GO DIRECTLY TO [facility]  The format for your next appointment:    [place]  Provider:    [place] TO [place]  Other Instructions  GO DIRECTLY TO [facility] EMERGENCY DEPARTMENT    Signed, Almyra Deforest, [place]  08/10/2019 3:35 PM    [facility]

## 2019-08-10 NOTE — Telephone Encounter (Signed)
   Our team had received sign out that patient was instructed to go to ER after office visit. Despite transitioning into the evening, he had not arrived yet. I called him but got no answer. I tried again and it turns out he has just arrived in the ED. He was thankful for call. Glendel Jaggers PA-C

## 2019-08-10 NOTE — Telephone Encounter (Signed)
   Kharson Franzel DOB: [birth year] MRN: [id number]   RIDER WAIVER AND RELEASE OF LIABILITY  For purposes of improving physical access to our facilities, [facility] is pleased to partner with third parties to provide [place] patients or other authorized individuals the option of convenient, on-demand ground transportation services (the [place]") through use of the technology service that enables users to request on-demand ground transportation from independent third-party providers.  By opting to use and accept these [employer], I, the undersigned, hereby agree on behalf of myself, and on behalf of any minor child using the [employer] for whom I am the parent or legal guardian, as follows:  1. Government social research officer provided to me are provided by independent third-party transportation providers who are not [employer] or employees and who are unaffiliated with [employer]. 2. [facility] is neither a transportation carrier nor a common or public carrier. 3. [facility] has no control over the quality or safety of the transportation that occurs as a result of the [employer]. 4. [facility] cannot guarantee that any third-party transportation provider will complete any arranged transportation service. 5. [facility] makes no representation, warranty, or guarantee regarding the reliability, timeliness, quality, safety, suitability, or availability of any of the Transport Services or that they will be error free. 6. I fully understand that traveling by vehicle involves risks and dangers of serious bodily injury, including permanent disability, paralysis, and death. I agree, on behalf of myself and on behalf of any minor child using the Transport Services for whom I am the parent or legal guardian, that the entire risk arising out of my use of the [employer] remains solely with me, to the maximum extent permitted under applicable law. 7. The [employer] are provided "as is" and "as available." [facility] disclaims all representations and warranties, express, implied or statutory, not expressly set out in these terms, including the implied warranties of merchantability and fitness for a particular purpose. 8. I hereby waive and release [facility], its agents, employees, officers, directors, representatives, insurers, attorneys, assigns, successors, subsidiaries, and affiliates from any and all past, present, or future claims, demands, liabilities, actions, causes of action, or suits of any kind directly or indirectly arising from acceptance and use of the [employer]. 9. I further waive and release  and its affiliates from all present and future liability and responsibility for any injury or death to persons or damages to property caused by or related to the use of the [employer]. 10. I have read this Waiver and Release of Liability, and I understand the terms used in it and their legal significance. This Waiver is freely and voluntarily given with the understanding that my right (as well as the right of any minor child for whom I am the parent or legal guardian using the [employer]) to legal recourse against  in connection with the [employer] is knowingly surrendered in return for use of these services.   I attest that I read the consent document to Terry Parks, gave Mr. Nethery the opportunity to ask questions and answered the questions asked (if any). I affirm that Terry Parks then provided consent for he's participation in this program.     Terry Parks

## 2019-08-10 NOTE — Patient Instructions (Signed)
Visit Information I will call you to see how your cardiology appointment went.  Goals Addressed            This Visit's Progress     Patient Stated   . "I think I need to see my heart doctor because my legs are more swollen, I'm really tired and I'm more short of breath" (pt-stated)       CARE PLAN ENTRY (see longitudinal plan of care for additional care plan information)   Current Barriers:  [name] Knowledge deficit related to basic heart failure pathophysiology and self care management- pt is c/o increased bilateral leg edema, wheezing and extreme fatigue, he says he did not make a follow up appointment with his cardiologist as was discussed on the last call with this RNCM . Knowledge Deficits related to heart failure medications . Patient does not have readable scale- will get scales to patient ASAP  Case Manager Clinical Goal(s):  [name] Over the next 30 days, patient will weigh self daily and record . Over the next 30 days, patient will verbalize understanding of Heart Failure Action Plan and when to call doctor . Over the next 30 days, patient will take all Heart Failure mediations as prescribed . Over the next 30 days, patient will weigh daily and record (notifying MD of 3 lb weight gain over night or 5 lb in a week)  Interventions:  . Spoke with patient - based on the sxs he is reporting today, he needs to be seen urgently by cardiologist . Called and secured appointment for patient with cardiology PA today at 2:30 pm . [place] [facility] BSW arranged urgent transportation via [facility] transportation and patient informed .   Patient Self Care Activities:  Takes Heart Failure Medications as prescribed . Weighs daily and record (notifying MD of 3 lb weight gain over night or 5 lb in a week) . Verbalizes understanding of and follows CHF Action Plan . Adheres to low sodium diet  Please see past updates related to this goal by clicking on the "Past Updates" button in the selected goal          The patient verbalized understanding of instructions provided today and declined a print copy of patient instruction materials.   The care management team will reach out to the patient again over the next 7 days.   [name] RN, CCM, [facility] RN Care Manager [phone]

## 2019-08-10 NOTE — ED Triage Notes (Signed)
Pt c/o increased shob x 1 week for admission for fluid overload. Pt reports he takes 6 fluid pills a day and continues to have significant edema. Pt reports he does not use 02 at home, no pain at this time.

## 2019-08-10 NOTE — Patient Instructions (Signed)
Medication Instructions:   GO DIRECTLY TO [place] EMERGENCY DEPARTMENT *If you need a refill on your cardiac medications before your next appointment, please call your pharmacy*  Lab Work:  GO DIRECTLY TO [place] [facility]  If you have labs (blood work) drawn today and your tests are completely normal, you will receive your results only by: [name] MyChart Message (if you have MyChart) OR . A paper copy in the mail If you have any lab test that is abnormal or we need to change your treatment, we will call you to review the results.  Testing/Procedures:  GO DIRECTLY TO [place] EMERGENCY DEPARTMENT  Follow-Up: At [facility], you and your health needs are our priority.  As part of our continuing mission to provide you with exceptional heart care, we have created designated Provider Care Teams.  These Care Teams include your primary Cardiologist (physician) and Advanced Practice Providers (APPs -  Physician Assistants and Nurse Practitioners) who all work together to provide you with the care you need, when you need it.  Your next appointment:     GO DIRECTLY TO [place] EMERGENCY DEPARTMENT  The format for your next appointment:    [place]  Provider:    GO DIRECTLY TO  EMERGENCY DEPARTMENT  Other Instructions  GO DIRECTLY TO [place]

## 2019-08-11 DIAGNOSIS — I4891 Unspecified atrial fibrillation: Secondary | ICD-10-CM | POA: Diagnosis not present

## 2019-08-11 DIAGNOSIS — Z20822 Contact with and (suspected) exposure to covid-19: Secondary | ICD-10-CM | POA: Diagnosis not present

## 2019-08-11 DIAGNOSIS — N1831 Chronic kidney disease, stage 3a: Secondary | ICD-10-CM

## 2019-08-11 DIAGNOSIS — Z9114 Patient's other noncompliance with medication regimen: Secondary | ICD-10-CM

## 2019-08-11 DIAGNOSIS — N400 Enlarged prostate without lower urinary tract symptoms: Secondary | ICD-10-CM | POA: Diagnosis not present

## 2019-08-11 DIAGNOSIS — Z6841 Body Mass Index (BMI) 40.0 and over, adult: Secondary | ICD-10-CM | POA: Diagnosis not present

## 2019-08-11 DIAGNOSIS — E785 Hyperlipidemia, unspecified: Secondary | ICD-10-CM

## 2019-08-11 DIAGNOSIS — I4819 Other persistent atrial fibrillation: Secondary | ICD-10-CM

## 2019-08-11 DIAGNOSIS — R609 Edema, unspecified: Secondary | ICD-10-CM | POA: Insufficient documentation

## 2019-08-11 DIAGNOSIS — Z833 Family history of diabetes mellitus: Secondary | ICD-10-CM | POA: Diagnosis not present

## 2019-08-11 DIAGNOSIS — I130 Hypertensive heart and chronic kidney disease with heart failure and stage 1 through stage 4 chronic kidney disease, or unspecified chronic kidney disease: Secondary | ICD-10-CM | POA: Diagnosis not present

## 2019-08-11 DIAGNOSIS — E1142 Type 2 diabetes mellitus with diabetic polyneuropathy: Secondary | ICD-10-CM | POA: Diagnosis not present

## 2019-08-11 DIAGNOSIS — Z7901 Long term (current) use of anticoagulants: Secondary | ICD-10-CM | POA: Diagnosis not present

## 2019-08-11 DIAGNOSIS — L280 Lichen simplex chronicus: Secondary | ICD-10-CM | POA: Diagnosis present

## 2019-08-11 DIAGNOSIS — F329 Major depressive disorder, single episode, unspecified: Secondary | ICD-10-CM | POA: Diagnosis present

## 2019-08-11 DIAGNOSIS — E1122 Type 2 diabetes mellitus with diabetic chronic kidney disease: Secondary | ICD-10-CM | POA: Diagnosis not present

## 2019-08-11 DIAGNOSIS — Z794 Long term (current) use of insulin: Secondary | ICD-10-CM

## 2019-08-11 DIAGNOSIS — Z9111 Patient's noncompliance with dietary regimen: Secondary | ICD-10-CM

## 2019-08-11 DIAGNOSIS — E669 Obesity, unspecified: Secondary | ICD-10-CM

## 2019-08-11 DIAGNOSIS — I5023 Acute on chronic systolic (congestive) heart failure: Secondary | ICD-10-CM | POA: Diagnosis not present

## 2019-08-11 DIAGNOSIS — R079 Chest pain, unspecified: Secondary | ICD-10-CM | POA: Diagnosis not present

## 2019-08-11 DIAGNOSIS — G4733 Obstructive sleep apnea (adult) (pediatric): Secondary | ICD-10-CM | POA: Diagnosis not present

## 2019-08-11 DIAGNOSIS — Z8546 Personal history of malignant neoplasm of prostate: Secondary | ICD-10-CM | POA: Diagnosis not present

## 2019-08-11 DIAGNOSIS — Z79899 Other long term (current) drug therapy: Secondary | ICD-10-CM

## 2019-08-11 DIAGNOSIS — Z888 Allergy status to other drugs, medicaments and biological substances status: Secondary | ICD-10-CM

## 2019-08-11 DIAGNOSIS — I429 Cardiomyopathy, unspecified: Secondary | ICD-10-CM | POA: Diagnosis present

## 2019-08-11 DIAGNOSIS — J45909 Unspecified asthma, uncomplicated: Secondary | ICD-10-CM | POA: Diagnosis not present

## 2019-08-11 DIAGNOSIS — K30 Functional dyspepsia: Secondary | ICD-10-CM | POA: Diagnosis not present

## 2019-08-11 DIAGNOSIS — N183 Chronic kidney disease, stage 3 unspecified: Secondary | ICD-10-CM | POA: Diagnosis not present

## 2019-08-11 DIAGNOSIS — Z8249 Family history of ischemic heart disease and other diseases of the circulatory system: Secondary | ICD-10-CM | POA: Diagnosis not present

## 2019-08-11 LAB — BASIC METABOLIC PANEL
Anion gap: 9 (ref 5–15)
BUN: 5 mg/dL — ABNORMAL LOW (ref 8–23)
CO2: 23 mmol/L (ref 22–32)
Calcium: 8 mg/dL — ABNORMAL LOW (ref 8.9–10.3)
Chloride: 110 mmol/L (ref 98–111)
Creatinine, Ser: 1.4 mg/dL — ABNORMAL HIGH (ref 0.61–1.24)
GFR calc Af Amer: >60 mL/min (ref >60)
GFR calc non Af Amer: 54 mL/min — ABNORMAL LOW (ref >60)
Glucose, Bld: 176 mg/dL — ABNORMAL HIGH (ref 70–99)
Potassium: 3.9 mmol/L (ref 3.5–5.1)
Sodium: 142 mmol/L (ref 135–145)

## 2019-08-11 LAB — CBC
HCT: 36.7 % — ABNORMAL LOW (ref 39.0–52.0)
Hemoglobin: 11.6 g/dL — ABNORMAL LOW (ref 13.0–17.0)
MCH: 27.9 pg (ref 26.0–34.0)
MCHC: 31.6 g/dL (ref 30.0–36.0)
MCV: 88.2 fL (ref 80.0–100.0)
Platelets: 182 10*3/uL (ref 150–400)
RBC: 4.16 MIL/uL — ABNORMAL LOW (ref 4.22–5.81)
RDW: 14.3 % (ref 11.5–15.5)
WBC: 6.8 10*3/uL (ref 4.0–10.5)
nRBC: 0 % (ref 0.0–0.2)

## 2019-08-11 LAB — SARS CORONAVIRUS 2 (TAT 6-24 HRS): SARS Coronavirus 2: NEGATIVE

## 2019-08-11 LAB — TROPONIN I (HIGH SENSITIVITY): Troponin I (High Sensitivity): 15 ng/L (ref <18)

## 2019-08-11 LAB — CBG MONITORING, ED
Glucose-Capillary: 185 mg/dL — ABNORMAL HIGH (ref 70–99)
Glucose-Capillary: 213 mg/dL — ABNORMAL HIGH (ref 70–99)
Glucose-Capillary: 159 mg/dL — ABNORMAL HIGH (ref 70–99)

## 2019-08-11 LAB — GLUCOSE, CAPILLARY: Glucose-Capillary: 119 mg/dL — ABNORMAL HIGH (ref 70–99)

## 2019-08-11 LAB — HIV ANTIBODY (ROUTINE TESTING W REFLEX): HIV Screen 4th Generation wRfx: NONREACTIVE

## 2019-08-11 MED ORDER — ACETAMINOPHEN 650 MG RE SUPP: 650.0000 mg | Freq: Four times a day (QID) | RECTAL | Status: DC | PRN

## 2019-08-11 MED ORDER — SPIRONOLACTONE 25 MG PO TABS
50.0000 mg | ORAL_TABLET | Freq: Every day | ORAL | Status: DC
  Administered 2019-08-11 – 2019-08-14 (×4): 50 mg via ORAL
  Filled 2019-08-11 (×4): qty 2

## 2019-08-11 MED ORDER — AMIODARONE HCL 200 MG PO TABS
200.0000 mg | ORAL_TABLET | Freq: Every day | ORAL | Status: DC
  Administered 2019-08-11 – 2019-08-14 (×4): 200 mg via ORAL
  Filled 2019-08-11 (×4): qty 1

## 2019-08-11 MED ORDER — CARVEDILOL 6.25 MG PO TABS
6.2500 mg | ORAL_TABLET | Freq: Two times a day (BID) | ORAL | Status: DC
  Administered 2019-08-11 (×2): 6.25 mg via ORAL
  Filled 2019-08-11 (×2): qty 1

## 2019-08-11 MED ORDER — VITAMIN D 25 MCG (1000 UNIT) PO TABS
1000.0000 [IU] | ORAL_TABLET | Freq: Every day | ORAL | Status: DC
  Administered 2019-08-11 – 2019-08-14 (×4): 1000 [IU] via ORAL
  Filled 2019-08-11 (×4): qty 1

## 2019-08-11 MED ORDER — GABAPENTIN 300 MG PO CAPS
300.0000 mg | ORAL_CAPSULE | Freq: Two times a day (BID) | ORAL | Status: DC
  Administered 2019-08-11 – 2019-08-14 (×7): 300 mg via ORAL
  Filled 2019-08-11 (×7): qty 1
  Filled 2019-08-11: qty 3

## 2019-08-11 MED ORDER — ACETAMINOPHEN 325 MG PO TABS: 650.0000 mg | ORAL_TABLET | Freq: Four times a day (QID) | ORAL | Status: DC | PRN

## 2019-08-11 MED ORDER — ASPIRIN 81 MG PO CHEW
324.0000 mg | CHEWABLE_TABLET | Freq: Once | ORAL | Status: AC
  Administered 2019-08-11: 324 mg via ORAL
  Filled 2019-08-11: qty 4

## 2019-08-11 MED ORDER — DULOXETINE HCL 60 MG PO CPEP
60.0000 mg | ORAL_CAPSULE | Freq: Every day | ORAL | Status: DC
  Administered 2019-08-11 – 2019-08-14 (×4): 60 mg via ORAL
  Filled 2019-08-11 (×4): qty 1

## 2019-08-11 MED ORDER — RIVAROXABAN 20 MG PO TABS
20.0000 mg | ORAL_TABLET | Freq: Every day | ORAL | Status: DC
  Administered 2019-08-11 – 2019-08-14 (×4): 20 mg via ORAL
  Filled 2019-08-11 (×4): qty 1

## 2019-08-11 MED ORDER — CARVEDILOL 25 MG PO TABS
25.0000 mg | ORAL_TABLET | Freq: Two times a day (BID) | ORAL | Status: DC
  Administered 2019-08-12 – 2019-08-14 (×5): 25 mg via ORAL
  Filled 2019-08-11 (×5): qty 1

## 2019-08-11 MED ORDER — LOSARTAN POTASSIUM 50 MG PO TABS
100.0000 mg | ORAL_TABLET | Freq: Every day | ORAL | Status: DC
  Administered 2019-08-11 – 2019-08-14 (×4): 100 mg via ORAL
  Filled 2019-08-11 (×4): qty 2

## 2019-08-11 MED ORDER — SENNOSIDES-DOCUSATE SODIUM 8.6-50 MG PO TABS: 1.0000 | ORAL_TABLET | Freq: Every evening | ORAL | Status: DC | PRN

## 2019-08-11 MED ORDER — INSULIN GLARGINE 100 UNIT/ML ~~LOC~~ SOLN
40.0000 [IU] | Freq: Every day | SUBCUTANEOUS | Status: DC
  Filled 2019-08-11: qty 0.4

## 2019-08-11 MED ORDER — PANTOPRAZOLE SODIUM 40 MG PO TBEC
80.0000 mg | DELAYED_RELEASE_TABLET | Freq: Every day | ORAL | Status: DC
  Administered 2019-08-11 – 2019-08-14 (×4): 80 mg via ORAL
  Filled 2019-08-11 (×4): qty 2

## 2019-08-11 MED ORDER — FUROSEMIDE 10 MG/ML IJ SOLN
120.0000 mg | Freq: Two times a day (BID) | INTRAVENOUS | Status: DC
  Administered 2019-08-11 – 2019-08-12 (×3): 120 mg via INTRAVENOUS
  Filled 2019-08-11: qty 10
  Filled 2019-08-11: qty 12
  Filled 2019-08-11 (×2): qty 10

## 2019-08-11 MED ORDER — INSULIN ASPART 100 UNIT/ML ~~LOC~~ SOLN
0.0000 [IU] | SUBCUTANEOUS | Status: DC
  Administered 2019-08-11: 11 [IU] via SUBCUTANEOUS
  Administered 2019-08-11 – 2019-08-12 (×2): 3 [IU] via SUBCUTANEOUS
  Administered 2019-08-12: 7 [IU] via SUBCUTANEOUS
  Administered 2019-08-12 – 2019-08-13 (×4): 4 [IU] via SUBCUTANEOUS
  Administered 2019-08-13: 7 [IU] via SUBCUTANEOUS
  Administered 2019-08-13 (×3): 3 [IU] via SUBCUTANEOUS
  Administered 2019-08-14: 7 [IU] via SUBCUTANEOUS
  Administered 2019-08-14 (×2): 4 [IU] via SUBCUTANEOUS
  Administered 2019-08-14: 7 [IU] via SUBCUTANEOUS

## 2019-08-11 MED ORDER — PROMETHAZINE HCL 25 MG PO TABS: 12.5000 mg | ORAL_TABLET | Freq: Four times a day (QID) | ORAL | Status: DC | PRN

## 2019-08-11 MED ORDER — CARVEDILOL 12.5 MG PO TABS: 25.0000 mg | ORAL_TABLET | Freq: Two times a day (BID) | ORAL | Status: DC

## 2019-08-11 MED ORDER — ISOSORBIDE MONONITRATE ER 30 MG PO TB24
30.0000 mg | ORAL_TABLET | Freq: Every day | ORAL | Status: DC
  Administered 2019-08-11 – 2019-08-14 (×4): 30 mg via ORAL
  Filled 2019-08-11 (×4): qty 1

## 2019-08-11 MED ORDER — INSULIN GLARGINE 100 UNIT/ML ~~LOC~~ SOLN
25.0000 [IU] | Freq: Every day | SUBCUTANEOUS | Status: DC
  Administered 2019-08-11: 25 [IU] via SUBCUTANEOUS
  Filled 2019-08-11: qty 0.25

## 2019-08-11 NOTE — ED Notes (Signed)
Pt eating lunch

## 2019-08-11 NOTE — Progress Notes (Signed)
Patient came to the floor from ED around 1700 alert and oriented, pt. C/o chest dull pain 2/10 that radiate to his back, 12 lead EKG done. Cardiology MD notified pain is relieved without med. Will continue to monitor the patient.

## 2019-08-11 NOTE — ED Notes (Signed)
Lunch tray ordered 

## 2019-08-11 NOTE — ED Notes (Signed)
Tele   Breakfast ordered  

## 2019-08-11 NOTE — Evaluation (Signed)
Physical Therapy Treatment Patient Details Name: Terry Parks MRN: [id number] DOB: 04-Oct-1955 Today's Date: 08/11/2019    History of Present Illness 64 year old person living with obesity, diabetes, atrial fibrillation, and chronic kidney disease stage IIIa presented to the emergency department from his cardiology office for volume overload and admitted with acute on chronic heart failure with reduced ejection fraction.     PT Comments    Pt admitted with/for fluid overload with acute on chronic CHF  Pt able to mobilize at a min guard to supervision level.  Pt currently limited functionally due to the problems listed below.  (see problems list.)  Pt will benefit from PT to maximize function and safety to be able to get home safely with available assist.    Follow Up Recommendations  No PT follow up;Supervision - Intermittent     Equipment Recommendations  None recommended by PT    Recommendations for Other Services       Precautions / Restrictions Precautions Precautions: Fall    Mobility  Bed Mobility Overal bed mobility: Needs Assistance Bed Mobility: Supine to Sit     Supine to sit: Supervision     General bed mobility comments: up via R elbow and raised HOB without assist, but with mild struggle.  Transfers Overall transfer level: Needs assistance   Transfers: Sit to/from Stand Sit to Stand: Supervision            Ambulation/Gait Ambulation/Gait assistance: Min guard Gait Distance (Feet): 10 Feet Assistive device: None Gait Pattern/deviations: Step-through pattern   Gait velocity interpretation: <1.31 ft/sec, indicative of household ambulator General Gait Details: wide BOS, slow cadence.   Stairs             Wheelchair Mobility    Modified Rankin (Stroke Patients Only)       Balance Overall balance assessment: Mild deficits observed, not formally tested                                          Cognition  Arousal/Alertness: Awake/alert Behavior During Therapy: WFL for tasks assessed/performed Overall Cognitive Status: Within Functional Limits for tasks assessed                                        Exercises      General Comments General comments (skin integrity, edema, etc.): vss, dyspnea 2/4, able to talk.      Pertinent Vitals/Pain Pain Assessment: Faces Faces Pain Scale: No hurt Pain Intervention(s): Monitored during session    Home Living Family/patient expects to be discharged to:: Private residence   Available Help at Discharge: Family;Available PRN/intermittently Type of Home: Apartment Home Access: Stairs to enter Entrance Stairs-Rails: Right;Left Home Layout: One level;Other (Comment)(on the 2nd floor)        Prior Function Level of Independence: Independent      Comments: ride cone buses to appt's, family friends help with transport   PT Goals (current goals can now be found in the care plan section) Acute Rehab PT Goals Patient Stated Goal: just want to be able to get around well PT Goal Formulation: With patient Time For Goal Achievement: 08/25/19 Potential to Achieve Goals: Good    Frequency    Min 3X/week      PT Plan      Co-evaluation  AM-PAC PT "6 Clicks" Mobility   Outcome Measure  Help needed turning from your back to your side while in a flat bed without using bedrails?: A Little Help needed moving from lying on your back to sitting on the side of a flat bed without using bedrails?: A Little Help needed moving to and from a bed to a chair (including a wheelchair)?: None Help needed standing up from a chair using your arms (e.g., wheelchair or bedside chair)?: None Help needed to walk in hospital room?: None Help needed climbing 3-5 steps with a railing? : A Little 6 Click Score: 21    End of Session   Activity Tolerance: Patient tolerated treatment well Patient left: in chair;with call bell/phone  within reach;with nursing/sitter in room Nurse Communication: Mobility status PT Visit Diagnosis: Other abnormalities of gait and mobility (R26.89);Unsteadiness on feet (R26.81)     Time: [id number] PT Time Calculation (min) (ACUTE ONLY): 18 min  Charges:                        08/11/2019  Ginger Carne., PT Acute Rehabilitation Services [phone]  (pager) [phone]  (office)   [name] 08/11/2019, 4:40 PM

## 2019-08-11 NOTE — Consult Note (Signed)
Cardiology Consultation:   Patient ID: Aquiles Abresch MRN: [id number]; DOB: Sep 11, 1955  Admit date: 08/10/2019 Date of Consult: 08/11/2019  Primary Care Provider: [name], MD Primary Cardiologist: [name], MD  Primary Electrophysiologist:  None    Patient Profile:   Dakoata Dimitrov is a 64 y.o. male with a hx of chronic combined HF, PAF, HTN, HL, DM, and OSA who is being seen today for the evaluation of CHF at the request of Dr. Evette Doffing.  History of Present Illness:   Mr. Thoreson is a 64 yo male with PMH noted above.  He had a stress test in 2013 showing a fixed anterior defect.  Echocardiogram in January 2019 showed an EF of 30 to 35%.  Etiology of this is not entirely clear however the degree of his LV dysfunction was felt to be related to poorly controlled blood pressure.  He was seen in the office on 12/26/2018 with heart failure medications titrated.  Had an echocardiogram 02/23/2019 showed an EF of 35 to 40%, moderate LVH, mild dilation of the left and right atrium.  Has been noted to have progressive decline of renal function with lab work back in February showing a creatinine of 2.  He was seen in the office on 08/10/2019 for evaluation of worsening dyspnea and lower extremity edema along with abdominal distention.  He was noted to be obviously short of breath while in the room.  Noted to have 3+ pitting edema on physical exam and there was concern about being able to diurese him as an outpatient.  Case was discussed with Dr. Debara Pickett who recommended evaluation in the ED and admission for diuresis.   In the ED his labs showed stable electrolytes, creatinine of 1.4, BNP 220, WBC 6.5, hemoglobin 12.2, high-sensitivity troponin 21>> 15.  EKG showed sinus rhythm with no acute ST/T wave abnormalities.  Chest x-ray with no acute edema.  He was admitted to internal medicine teaching service and started on IV Lasix 120 mg twice daily.   In briefly talking with the patient he reports  frequent snacking and diet indiscretion regarding salt and fluid intake. Says he has been compliant with home medications including diuretics.   Past Medical History:  Diagnosis Date  . Abscessed tooth    top back large cavity no pain or drainage, one on bottom  pt pulled tooth 4-5 months ago, right top large hole in tooth  . AKI (acute kidney injury) ([place])   . Allergic rhinitis   . Anemia   . Anxiety   . Asthma   . Atrial fibrillation ([place])   . BPH (benign prostatic hypertrophy)    Massive BPH noted on cystoscopy 1/23/ 2012 by Dr. Risa Grill.  . Cancer [facility])    prostate cancer 2019  . Cardiomyopathy ([place])   . CHF (congestive heart failure) ([place])   . Cough 03/30/2012  . Depression   . Diabetes mellitus 04/08/2008   type 2  . Foley catheter in place 07-05-17 placed  . Headache(784.0)    hx migraines none recent  . Hyperlipemia   . Hypertension   . Hypertensive cardiopathy 03/01/2006   2-D echocardiogram 02/01/2012 showed moderate LVH, mildly to moderately reduced left ventricular systolic function with an estimated ejection fraction of 40-45%, and diffuse hypokinesis.  A nuclear medicine stress study done 01/31/2012 showed no reversible ischemia, a small mid anterior wall fixed defect/infarct, and ejection fraction 42%.      . Neck pain   . Nephrolithiasis 05/29/2010   CT scan of abdomen/pelvis  on 05/29/2010 showed an obstructing approximate 1-2 mm calculus at the left UVJ, and an approximate 1-2 mm left lower pole renal calculus.   Patient had continuing severe pain , and an elevation of his serum creatinine to a value of 1.75 on 06/06/2010.  Patient underwent cystoscopy on 06/08/2010 by Dr. Risa Grill, but attempts at retrograde pyelogram and ureteroscopy were unsucc  . Numbness 01/08/2018  . Obstructive sleep apnea 03/06/2008   Sleep study 03/06/08 showed severe OSA/hypopnea syndrome, with successful CPAP titration to 13 CWP using a medium ResMed Mirage Quattro full face mask with heated  humidifier.   . Rash 04/17/2014  . Renal calculus 05/29/2010   CT scan of abdomen/pelvis on 05/29/2010 showed an obstructing approximate 1-2 mm calculus at the left UVJ, and an approximate 1-2 mm left lower pole renal calculus.   Patient had continuing severe pain , and an elevation of his serum creatinine to a value of 1.75 on 06/06/2010.  The stone had apparently passed and was not seen on repeat CT 06/08/2010.  . Sleep apnea    haven't use cpap in 2 years  . Tooth pain 10/29/2017  . Urinary straining 11/02/2016    Past Surgical History:  Procedure Laterality Date  . big toe nails removed Bilateral 20 yrs ago  . CARDIOVERSION N/A 06/08/2017   Procedure: CARDIOVERSION;  Surgeon: Josue Hector, MD;  Location[facility]COPY;  Service: Cardiovascular;  Laterality: N/A;  . COLONOSCOPY    . CYSTOSCOPY W/ RETROGRADES    . IR RADIOLOGIST EVAL & MGMT  02/02/2018  . LYMPHADENECTOMY Bilateral 10/06/2017   Procedure: LYMPHADENECTOMY;  Surgeon: Lucas Mallow, MD;  Location: [facility] ORS;  Service: Urology;  Laterality: Bilateral;  . ROBOT ASSISTED LAPAROSCOPIC RADICAL PROSTATECTOMY N/A 10/06/2017   Procedure: XI ROBOTIC ASSISTED LAPAROSCOPIC RADICAL PROSTATECTOMY;  Surgeon: Lucas Mallow, MD;  Location: [facility] ORS;  Service: Urology;  Laterality: N/A;  . TRANSURETHRAL RESECTION OF BLADDER TUMOR N/A 07/13/2017   Procedure: TRANSURETHRAL RESECTION OF PROSTATE;  Surgeon: Lucas Mallow, MD;  Location: [facility] ORS;  Service: Urology;  Laterality: N/A;     Home Medications:  Prior to Admission medications   Medication Sig Start Date End Date Taking? Authorizing Provider  acetaminophen (TYLENOL) 325 MG tablet Take 325-650 mg by mouth every 6 (six) hours as needed (for pain).   Yes [provider]  amiodarone (PACERONE) 200 MG tablet Take 1 tablet (200 mg total) by mouth daily. 06/21/18  Yes [name], Vahini, MD  bisacodyl (DULCOLAX) 5 MG EC tablet Take 5 mg by mouth daily as needed for moderate constipation.    Yes [provider]  carvedilol (COREG) 25 MG tablet Take 1 tablet (25 mg total) by mouth 2 (two) times daily with a meal. 12/26/18 08/11/19 Yes [name], [name], MD  DULoxetine (CYMBALTA) 60 MG capsule Take 1 capsule (60 mg total) by mouth daily. 06/21/18 08/11/19 Yes [name], Vahini, MD  gabapentin (NEURONTIN) 300 MG capsule Take 1 capsule (300 mg total) by mouth 2 (two) times daily. 10/18/18  Yes [name], Vahini, MD  Insulin Degludec-Liraglutide (XULTOPHY) 100-3.6 UNIT-MG/ML SOPN Inject 50 Units into the skin daily. 10/18/18  Yes [name], Vahini, MD  isosorbide mononitrate (IMDUR) 30 MG 24 hr tablet Take 1 tablet (30 mg total) by mouth daily. 06/21/18  Yes [name], Vahini, MD  losartan (COZAAR) 100 MG tablet Take 1 tablet (100 mg total) by mouth daily. 11/22/18  Yes [name], Vahini, MD  metformin (FORTAMET) 1000 MG (OSM) 24 hr tablet Take  2 tablets (2,000 mg total) by mouth daily with breakfast. Patient taking differently: Take 2,000 mg by mouth 2 (two) times daily with a meal.  07/07/18 08/11/19 Yes [name], Vahini, MD  omeprazole (PRILOSEC) 40 MG capsule Take 1 capsule (40 mg total) by mouth 2 (two) times daily before a meal. 01/11/19  Yes Mansouraty, [name], MD  rivaroxaban (XARELTO) 20 MG TABS tablet Take 1 tablet (20 mg total) by mouth daily with breakfast. Patient taking differently: Take 20 mg by mouth daily with supper.  11/22/18  Yes [name], Vahini, MD  spironolactone (ALDACTONE) 50 MG tablet Take 1 tablet (50 mg total) by mouth daily. 12/26/18  Yes [name], [name], MD  torsemide (DEMADEX) 20 MG tablet Take 2.5 tablets (50 mg total) by mouth 2 (two) times daily. Patient taking differently: Take 60 mg by mouth 2 (two) times daily. Take med before 5p 03/15/19 08/11/19 Yes [name], [name], MD  Accu-Chek Softclix Lancets lancets Check blood sugar three times a day as instructed 07/28/18   [name], MD  Blood Glucose Monitoring Suppl (ACCU-CHEK AVIVA) device Use as instructed to check blood sugar up  to 3 times a day 06/20/19 06/19/20  [name], MD  cholecalciferol (VITAMIN D3) 25 MCG (1000 UT) tablet Take 1 tablet (1,000 Units total) by mouth daily. Patient not taking: Reported on 08/11/2019 09/20/18   [name], MD  glucose blood (ACCU-CHEK AVIVA) test strip Use to check blood sugar 3 times daily diag code E11.42. insulin dependent 07/20/18   Forde Dandy, PharmD  Insulin Pen Needle (CARETOUCH PEN NEEDLES) 31G X 6 MM MISC 1 pen by Does not apply route at bedtime. 06/21/18   [name], MD    Inpatient Medications: Scheduled Meds: . amiodarone  200 mg Oral Daily  . carvedilol  6.25 mg Oral BID WC  . cholecalciferol  1,000 Units Oral Daily  . DULoxetine  60 mg Oral Daily  . gabapentin  300 mg Oral BID  . isosorbide mononitrate  30 mg Oral Daily  . losartan  100 mg Oral Daily  . pantoprazole  80 mg Oral Daily  . rivaroxaban  20 mg Oral Q breakfast  . sodium chloride flush  3 mL Intravenous Once  . spironolactone  50 mg Oral Daily   Continuous Infusions: . furosemide Stopped (08/11/19 0730)   PRN Meds: acetaminophen **OR** acetaminophen, promethazine, senna-docusate  Allergies:    Allergies  Allergen Reactions  . Lisinopril Cough    Social History:   Social History   Socioeconomic History  . Marital status: Single    Spouse name: Not on file  . Number of children: 4  . Years of education: 68  . Highest education level: Not on file  Occupational History  . Occupation:        Employer: UNEMPLOYED  Tobacco Use  . Smoking status: Never Smoker  . Smokeless tobacco: Never Used  Substance and Sexual Activity  . Alcohol use: No    Alcohol/week: 0.0 standard drinks  . Drug use: No  . Sexual activity: Not Currently  Other Topics Concern  . Not on file  Social History Narrative   Divorced, 4 children, lives alone.     Social Determinants of Health   Financial Resource Strain: Low Risk   . Difficulty of Paying Living Expenses: Not hard at all  Food  Insecurity: No Food Insecurity  . Worried About Charity fundraiser in the Last Year: Never true  . Ran Out of Food in the Last  Year: Never true  Transportation Needs: No Transportation Needs  . Lack of Transportation (Medical): No  . Lack of Transportation (Non-Medical): No  Physical Activity: Inactive  . Days of Exercise per Week: 0 days  . Minutes of Exercise per Session: 0 min  Stress:   . Feeling of Stress :   Social Connections: Unknown  . Frequency of Communication with Friends and Family: More than three times a week  . Frequency of Social Gatherings with Friends and Family: More than three times a week  . Attends Religious Services: More than 4 times per year  . Active Member of Clubs or Organizations: Not on file  . Attends Archivist Meetings: Not on file  . Marital Status: Not on file  Intimate Partner Violence:   . Fear of Current or Ex-Partner:   . Emotionally Abused:   [name] Physically Abused:   . Sexually Abused:     Family History:    Family History  Problem Relation Age of Onset  . Breast cancer Mother   . Hypertension Father   . Diabetes Maternal Grandmother   . Colon cancer Neg Hx   . Prostate cancer Neg Hx   . Heart attack Neg Hx   . Esophageal cancer Neg Hx   . Inflammatory bowel disease Neg Hx   . Liver disease Neg Hx   . Pancreatic cancer Neg Hx   . Rectal cancer Neg Hx   . Stomach cancer Neg Hx      ROS:  Please see the history of present illness.   All other ROS reviewed and negative.     Physical Exam/Data:   Vitals:   08/11/19 0630 08/11/19 0645 08/11/19 0700 08/11/19 0715  BP: 126/73 132/69 128/75 134/70  Pulse: 80 81 86 90  Resp: 16 14 12 14   Temp:      TempSrc:      SpO2: 97% 98% 93% 96%    Intake/Output Summary (Last 24 hours) at 08/11/2019 0804 Last data filed at 08/11/2019 0739 Gross per 24 hour  Intake --  Output 1200 ml  Net -1200 ml   Last 3 Weights 08/10/2019 07/12/2019 06/01/2019  Weight (lbs) 330 lb 3.2 oz 308  lb 320 lb  Weight (kg) 149.778 kg 139.708 kg 145.151 kg     There is no height or weight on file to calculate BMI.  General:  Obese, AAM, in no acute distress HEENT: normal Lymph: no adenopathy Neck: difficult to assess JVD 2/2 neck girth.  Endocrine:  No thryomegaly Vascular: No carotid bruits; FA pulses 2+ bilaterally without bruits  Cardiac:  normal S1, S2; RRR; no murmur  Lungs:  clear to auscultation bilaterally, no wheezing, rhonchi or rales  Abd: soft, nontender, no hepatomegaly  Ext: 3+ pitting LE edema Musculoskeletal:  No deformities, BUE and BLE strength normal and equal Skin: warm and dry  Neuro:  CNs 2-12 intact, no focal abnormalities noted Psych:  Normal affect   EKG:  The EKG was personally reviewed and demonstrates:  SR with no acute ischemia  Relevant CV Studies:  Echo: 02/23/20  IMPRESSIONS    1. Left ventricular ejection fraction, by visual estimation, is 35 to  40%. The left ventricle has moderately decreased function. Normal left  ventricular size. There is moderate left ventricular hypertrophy. There is  blobal left ventricular hypokinesis,  without regional variation.  2. Left ventricular diastolic Doppler parameters are consistent with  impaired relaxation pattern of LV diastolic filling.  3. Global right ventricle has  mildly reduced systolic function.The right  ventricular size is not well visualized. Right vetricular wall thickness  was not assessed.  4. Left atrial size was mildly dilated.  5. Right atrial size was mildly dilated.  6. The aortic valve is normal in structure. Aortic valve regurgitation  was not visualized by color flow Doppler.  7. The aortic root was not well visualized.  8. TR signal is inadequate for assessing pulmonary artery systolic  pressure.  9. The inferior vena cava is normal in size with greater than 50%  respiratory variability, suggesting right atrial pressure of 3 mmHg.  10. The mitral valve is normal in  structure. No evidence of mitral valve  regurgitation.  11. The tricuspid valve is normal in structure. Tricuspid valve  regurgitation was not visualized by color flow Doppler.  12. The pulmonic valve was not well visualized. Pulmonic valve  regurgitation is not visualized by color flow Doppler.   Laboratory Data:  High Sensitivity Troponin:   Recent Labs  Lab 08/10/19 2052 08/10/19 2300  TROPONINIHS 21* 15     Chemistry Recent Labs  Lab 08/10/19 2052 08/11/19 0421  NA 142 142  K 3.9 3.9  CL 111 110  CO2 23 23  GLUCOSE 109* 176*  BUN 6* 5*  CREATININE 1.43* 1.40*  CALCIUM 8.1* 8.0*  GFRNONAA 52* 54*  GFRAA >60 >60  ANIONGAP 8 9    No results for input(s): PROT, ALBUMIN, AST, ALT, ALKPHOS, BILITOT in the last 168 hours. Hematology Recent Labs  Lab 08/10/19 2052 08/11/19 0421  WBC 6.5 6.8  RBC 4.32 4.16*  HGB 12.0* 11.6*  HCT 37.7* 36.7*  MCV 87.3 88.2  MCH 27.8 27.9  MCHC 31.8 31.6  RDW 13.9 14.3  PLT 200 182   BNP Recent Labs  Lab 08/10/19 2107  BNP 220.7*    DDimer No results for input(s): DDIMER in the last 168 hours.   Radiology/Studies:  DG Chest 2 View  Result Date: 08/10/2019 CLINICAL DATA:  Chest pain EXAM: CHEST - 2 VIEW COMPARISON:  April 23, 2019 FINDINGS: The heart size is enlarged. There is no pneumothorax or large pleural effusion. No acute osseous abnormality. No focal infiltrate. IMPRESSION: No active cardiopulmonary disease. Electronically Signed   By: Constance Holster M.D.   On: 08/10/2019 21:24   Assessment and Plan:   Aleksi Lavigna is a 64 y.o. male with a hx of chronic combined HF, PAF, HTN, HL, DM, and OSA who is being seen today for the evaluation of CHF at the request of Dr. Evette Doffing.  1. Acute on Chronic combined HF: patient with known EF of 35-40%. Reports he has been compliant with diuretic but increased salt and fluid intake over the past couple of weeks. Noted his weight has been increased and clothes were fitting  tighter. BNP was elevated and marked LE edema on exam. He reports feeling better with diuresis but still volume overloaded.  -- would continue with IV lasix, monitoring renal function. On torsemide 60mg  BID prior to admission. -- daily weights, I&Os -- on coreg, spiro and ARB   2. PAF: on amiodarone 200mg  daily, along with Xarelto. In NSR on admission.   3. DM: Hgb A1c 12.7 back in Feb. Management per primary  4. OSA: Cpap   5. CKD II: Cr is actually improved from check back in Feb. Would monitor closely with the need for diuresis.   For questions or updates, please contact [place] Please consult [email or web address] for contact info under  Signed, Reino Bellis, NP  08/11/2019 8:04 AM

## 2019-08-11 NOTE — Progress Notes (Signed)
Subjective: HD#0 Events Overnight: Admitted overnight.  Patient was seen this morning on rounds. Patient admits to increased lower extremity edema, SHOB, and decreased exercise tolerance.  Otherwise patient states that he feels comfortable.  Denies any other symptoms at this time.  Objective:  Vital signs in last 24 hours: Vitals:   08/11/19 0400 08/11/19 0500 08/11/19 0545 08/11/19 0600  BP: (!) 160/95 (!) 156/84 (!) 142/84 131/76  Pulse: 82 80 88 84  Resp: 16 17 18 19   Temp:      TempSrc:      SpO2: 100% 100% 100% 100%   Supplemental O2: RA   Physical Exam: Physical Exam  Constitutional: He is oriented to person, place, and time and well-developed, well-nourished, and in no distress.  HENT:  Head: Normocephalic and atraumatic.  Eyes: EOM are normal.  Cardiovascular: Normal rate and intact distal pulses.  No murmur heard. Pulmonary/Chest: Effort normal. No respiratory distress.  Abdominal: Soft. He exhibits no distension. There is no abdominal tenderness.  Musculoskeletal:        General: Edema (2+ pitting edema) present. Normal range of motion.     Cervical back: Normal range of motion.  Neurological: He is alert and oriented to person, place, and time.  Skin: Skin is warm and dry.    There were no vitals filed for this visit.  No intake or output data in the 24 hours ending 08/11/19 0618  Risk Score: N/A  Pertinent labs/Imaging: CBC Latest Ref Rng & Units 08/11/2019 08/10/2019 04/23/2019  WBC 4.0 - 10.5 K/uL 6.8 6.5 8.0  Hemoglobin 13.0 - 17.0 g/dL 11.6(L) 12.0(L) 14.2  Hematocrit 39.0 - 52.0 % 36.7(L) 37.7(L) 44.2  Platelets 150 - 400 K/uL 182 200 178    CMP Latest Ref Rng & Units 08/11/2019 08/10/2019 07/12/2019  Glucose 70 - 99 mg/dL 176(H) 109(H) 586(HH)  BUN 8 - 23 mg/dL 5(L) 6(L) 17  Creatinine 0.61 - 1.24 mg/dL 1.40(H) 1.43(H) 2.01(H)  Sodium 135 - 145 mmol/L 142 142 129(L)  Potassium 3.5 - 5.1 mmol/L 3.9 3.9 3.9  Chloride 98 - 111 mmol/L 110 111 89(L)   CO2 22 - 32 mmol/L 23 23 25   Calcium 8.9 - 10.3 mg/dL 8.0(L) 8.1(L) 8.8(L)  Total Protein 6.5 - 8.1 g/dL - - -  Total Bilirubin 0.3 - 1.2 mg/dL - - -  Alkaline Phos 38 - 126 U/L - - -  AST 15 - 41 U/L - - -  ALT 0 - 44 U/L - - -    DG Chest 2 View  Result Date: 08/10/2019 CLINICAL DATA:  Chest pain EXAM: CHEST - 2 VIEW COMPARISON:  April 23, 2019 FINDINGS: The heart size is enlarged. There is no pneumothorax or large pleural effusion. No acute osseous abnormality. No focal infiltrate. IMPRESSION: No active cardiopulmonary disease. Electronically Signed   By: Constance Holster M.D.   On: 08/10/2019 21:24     Assessment/Plan:  Active Problems:   Peripheral edema    Patient Summary: Terry Parks is a 64 y.o. with pertinent PMH of BPH, HFrEF (EF 35-40%), DMT2, HLD, HTN, and OSA who presented with shortness of breath and lower extremity edema and admit for further evaluation and management CHF exacerbation on hospital day 0  #Acute on chronic heart failure #Hypervolemia  #HFrEF  Patient is doing well.  States that he is tolerating the IV Lasix well and continues to have significant urine output. -Patient is up 10 kg from his dry weight or roughly 20 pounds. - Cardiology  consulted in the ED - Strict I&O, daily weights - Last echo was on 02/23/2019 showing EF of 35-40%, moderate left ventricular hypertrophy and global left ventricular hypokinesis.  Mild left and right atrial enlargement.  No valvular abnormalities appreciated. -Started on Lasix 120 mg, Imdur 30 mg daily, Cozaar 100 mg daily, Aldactone 50 mg daily -Restart Coreg 6.25 mg daily.  #Atrial Fibrillation:  Patient is currently in sinus rhythm. -Continue telemetry -Held Coreg 25 mg -Continue Xarelto 20 mg and amiodarone 200 mg  #Chronic Kidney Disease (Stage 3A) Patient has a history of chronic kidney disease.  He does have wide variation in his baseline over the last 6 months, but it is close to 1.4 with a GFR  greater than 50. -Avoid nephrotoxic agents  #Diabetes Mellitis Type II:  -He takes 50 units of 70/30 insulin at home -I will start him on 25 units of Lantus and SSI resistant. -Monitor blood sugars overnight every 4 hours and adjust insulin as needed.   Diet: Heart Healthy/fluid restricted IVF: None,None VTE: Enoxaparin Code: Full PT/OT recs: Pending TOC recs: pending   Dispo: Anticipated discharge 1-2 days.    Marianna Payment, D.O. [facility], PGY-1 Date 08/11/2019 Time 6:18 AM

## 2019-08-11 NOTE — ED Provider Notes (Signed)
TIME SEEN: 3:47 AM  CHIEF COMPLAINT: Lower extremity swelling, chest pain and shortness of breath  HPI: Patient is a 64 year old male with history of CHF who presents to the emergency department with increased lower extremity swelling that he states has been ongoing for weeks.  States he saw his cardiologist yesterday who told him to come to the emergency department for admission to the hospital for diuresis.  Reports that he has intermittent left-sided chest pain that he just describes as a "pain".  No pressure or tightness.  No aggravating or relieving factors.  Symptoms only last a few seconds and then resolved.  He does report that he has shortness of breath and feels more short of breath lying flat and with exertion.  ROS: See HPI Constitutional: no fever  Eyes: no drainage  ENT: no runny nose   Cardiovascular:   chest pain  Resp:  SOB  GI: no vomiting GU: no dysuria Integumentary: no rash  Allergy: no hives  Musculoskeletal:  leg swelling  Neurological: no slurred speech ROS otherwise negative  PAST MEDICAL HISTORY/PAST SURGICAL HISTORY:  Past Medical History:  Diagnosis Date  . Abscessed tooth    top back large cavity no pain or drainage, one on bottom  pt pulled tooth 4-5 months ago, right top large hole in tooth  . AKI (acute kidney injury) ([place])   . Allergic rhinitis   . Anemia   . Anxiety   . Asthma   . Atrial fibrillation ([place])   . BPH (benign prostatic hypertrophy)    Massive BPH noted on cystoscopy 1/23/ 2012 by Dr. Risa Grill.  . Cancer [facility])    prostate cancer 2019  . Cardiomyopathy ([place])   . CHF (congestive heart failure) ([place])   . Cough 03/30/2012  . Depression   . Diabetes mellitus 04/08/2008   type 2  . Foley catheter in place 07-05-17 placed  . Headache(784.0)    hx migraines none recent  . Hyperlipemia   . Hypertension   . Hypertensive cardiopathy 03/01/2006   2-D echocardiogram 02/01/2012 showed moderate LVH, mildly to moderately reduced left  ventricular systolic function with an estimated ejection fraction of 40-45%, and diffuse hypokinesis.  A nuclear medicine stress study done 01/31/2012 showed no reversible ischemia, a small mid anterior wall fixed defect/infarct, and ejection fraction 42%.      . Neck pain   . Nephrolithiasis 05/29/2010   CT scan of abdomen/pelvis on 05/29/2010 showed an obstructing approximate 1-2 mm calculus at the left UVJ, and an approximate 1-2 mm left lower pole renal calculus.   Patient had continuing severe pain , and an elevation of his serum creatinine to a value of 1.75 on 06/06/2010.  Patient underwent cystoscopy on 06/08/2010 by Dr. Risa Grill, but attempts at retrograde pyelogram and ureteroscopy were unsucc  . Numbness 01/08/2018  . Obstructive sleep apnea 03/06/2008   Sleep study 03/06/08 showed severe OSA/hypopnea syndrome, with successful CPAP titration to 13 CWP using a medium ResMed Mirage Quattro full face mask with heated humidifier.   . Rash 04/17/2014  . Renal calculus 05/29/2010   CT scan of abdomen/pelvis on 05/29/2010 showed an obstructing approximate 1-2 mm calculus at the left UVJ, and an approximate 1-2 mm left lower pole renal calculus.   Patient had continuing severe pain , and an elevation of his serum creatinine to a value of 1.75 on 06/06/2010.  The stone had apparently passed and was not seen on repeat CT 06/08/2010.  . Sleep apnea    haven't use  cpap in 2 years  . Tooth pain 10/29/2017  . Urinary straining 11/02/2016    MEDICATIONS:  Prior to Admission medications   Medication Sig Start Date End Date Taking? Authorizing Provider  Accu-Chek Softclix Lancets lancets Check blood sugar three times a day as instructed 07/28/18   [name], [name], MD  acetaminophen (TYLENOL) 325 MG tablet Take 325-650 mg by mouth every 6 (six) hours as needed (for pain).    [provider]  amiodarone (PACERONE) 200 MG tablet Take 1 tablet (200 mg total) by mouth daily. 06/21/18   [name], [name], MD  bisacodyl  (DULCOLAX) 5 MG EC tablet Take 5 mg by mouth daily as needed for moderate constipation.    [provider]  Blood Glucose Monitoring Suppl (ACCU-CHEK AVIVA) device Use as instructed to check blood sugar up to 3 times a day 06/20/19 06/19/20  [name], MD  carvedilol (COREG) 25 MG tablet Take 1 tablet (25 mg total) by mouth 2 (two) times daily with a meal. 12/26/18 08/10/19  [name], [name], MD  cholecalciferol (VITAMIN D3) 25 MCG (1000 UT) tablet Take 1 tablet (1,000 Units total) by mouth daily. 09/20/18   [name], [name], MD  DULoxetine (CYMBALTA) 60 MG capsule Take 1 capsule (60 mg total) by mouth daily. 06/21/18 08/10/19  [name], MD  gabapentin (NEURONTIN) 300 MG capsule Take 1 capsule (300 mg total) by mouth 2 (two) times daily. 10/18/18   [name], [name], MD  glucose blood (ACCU-CHEK AVIVA) test strip Use to check blood sugar 3 times daily diag code E11.42. insulin dependent 07/20/18   Forde Dandy, PharmD  Insulin Degludec-Liraglutide (XULTOPHY) 100-3.6 UNIT-MG/ML SOPN Inject 50 Units into the skin daily. 10/18/18   [name], [name], MD  Insulin Pen Needle (CARETOUCH PEN NEEDLES) 31G X 6 MM MISC 1 pen by Does not apply route at bedtime. 06/21/18   [name], MD  isosorbide mononitrate (IMDUR) 30 MG 24 hr tablet Take 1 tablet (30 mg total) by mouth daily. 06/21/18   [name], [name], MD  losartan (COZAAR) 100 MG tablet Take 1 tablet (100 mg total) by mouth daily. 11/22/18   [name], MD  metformin (FORTAMET) 1000 MG (OSM) 24 hr tablet Take 2 tablets (2,000 mg total) by mouth daily with breakfast. Patient taking differently: Take 2,000 mg by mouth 2 (two) times daily with a meal.  07/07/18 08/10/19  [name], [name], MD  omeprazole (PRILOSEC) 40 MG capsule Take 1 capsule (40 mg total) by mouth 2 (two) times daily before a meal. 01/11/19   Mansouraty, [name], MD  rivaroxaban (XARELTO) 20 MG TABS tablet Take 1 tablet (20 mg total) by mouth daily with breakfast. Patient taking differently:  Take 20 mg by mouth daily with supper.  11/22/18   [name], [name], MD  spironolactone (ALDACTONE) 50 MG tablet Take 1 tablet (50 mg total) by mouth daily. 12/26/18   [name], [name], MD  torsemide (DEMADEX) 20 MG tablet Take 2.5 tablets (50 mg total) by mouth 2 (two) times daily. Patient taking differently: Take 100 mg by mouth at bedtime.  03/15/19 08/10/19  [name], [name], MD    ALLERGIES:  Allergies  Allergen Reactions  . Lisinopril Cough    SOCIAL HISTORY:  Social History   Tobacco Use  . Smoking status: Never Smoker  . Smokeless tobacco: Never Used  Substance Use Topics  . Alcohol use: No    Alcohol/week: 0.0 standard drinks    FAMILY HISTORY: Family History  Problem Relation Age of Onset  . Breast cancer  Mother   . Hypertension Father   . Diabetes Maternal Grandmother   . Colon cancer Neg Hx   . Prostate cancer Neg Hx   . Heart attack Neg Hx   . Esophageal cancer Neg Hx   . Inflammatory bowel disease Neg Hx   . Liver disease Neg Hx   . Pancreatic cancer Neg Hx   . Rectal cancer Neg Hx   . Stomach cancer Neg Hx     EXAM: BP (!) 162/90   Pulse 87   Temp 98 F (36.7 C)   Resp 19   SpO2 100%  CONSTITUTIONAL: Alert and oriented and responds appropriately to questions.  Morbidly obese.  In no distress. HEAD: Normocephalic EYES: Conjunctivae clear, pupils appear equal, EOM appear intact ENT: normal nose; moist mucous membranes NECK: Supple, normal ROM CARD: RRR; S1 and S2 appreciated; no murmurs, no clicks, no rubs, no gallops RESP: Normal chest excursion without splinting or tachypnea; breath sounds clear and equal bilaterally; no wheezes, no rhonchi, no rales, no hypoxia or respiratory distress, speaking full sentences, exam limited secondary to body habitus ABD/GI: Normal bowel sounds; non-distended; soft, non-tender, no rebound, no guarding, no peritoneal signs, no hepatosplenomegaly, I do not appreciate anasarca, has umbilical hernia that is reducible BACK:   The back appears normal EXT: Normal ROM in all joints; no deformity noted, 2+ pitting edema in bilateral lower extremities, no calf tenderness or calf swelling, no redness or warmth SKIN: Normal color for age and race; warm; no rash on exposed skin NEURO: Moves all extremities equally PSYCH: The patient's mood and manner are appropriate.   MEDICAL DECISION MAKING: Patient here with signs of volume overload.  Sent here from cardiology clinic for medicine admission.  Will discuss with cardiologist on-call.  ED PROGRESS: 3:45 AM  Spoke with Dr. Emilio Aspen on-call for cardiology.  Appreciate his help.  He is aware of patient and states that Dr. Debara Pickett wanted patient admitted for IV diuresis to the medicine service.  He is seen by the internal medicine service.  He reports weeks worth of lower extremity swelling and now atypical chest pain with shortness of breath.  He does look volume overloaded on exam but lungs are clear without hypoxia and chest x-ray shows no edema.  It appears though the labs that showed hyponatremia, hyperglycemia and worsening acute on chronic renal failure were from a month ago.  These repeat labs today show that his creatinine has improved to 1.4.  His blood glucose is currently 185.  Cardiology still recommends medicine admission for IV diuresis.   4:01 AM Discussed patient's case with IM resident, Dr. Rivka Barbara.  I have recommended admission per cardiology's recommendation and patient (and family if present) agree with this plan. Admitting physician will place admission orders.  She states that she plans to talk to the cardiology fellow on-call as well.  She reports that she has been following his peripheral edema for some time and patient has a history of medical noncompliance.  I reviewed all nursing notes, vitals, pertinent previous records and interpreted all EKGs, lab and urine results, imaging (as available).      Sekou Attebery was evaluated in Emergency Department on  08/11/2019 for the symptoms described in the history of present illness. He was evaluated in the context of the global COVID-19 pandemic, which necessitated consideration that the patient might be at risk for infection with the SARS-CoV-2 virus that causes COVID-19. Institutional protocols and algorithms that pertain to the evaluation of patients at risk  for COVID-19 are in a state of rapid change based on information released by regulatory bodies including the CDC and federal and state organizations. These policies and algorithms were followed during the patient's care in the ED.  Patient was seen wearing N95, face shield, gloves.    Robert Sunga, Delice Bison, DO 08/11/19 [phone]

## 2019-08-11 NOTE — Procedures (Signed)
Patient with orders for CPAP at night.  Patient states that he has some dental issues that he feels will make wearing the CPAP hard.  He declined CPAP for the night.

## 2019-08-11 NOTE — H&P (Addendum)
Date: 08/11/2019               Patient Name:  Terry Parks MRN: [id number]  DOB: 02/23/1956 Age / Sex: 64 y.o., male   PCP: [name], MD         Medical Service: Internal Medicine Teaching Service         Attending Physician: Dr. Evette Doffing, Mallie Mussel, *    First Contact: Dr. Marianna Payment Pager: [phone]  Second Contact: Dr. Eileen Stanford Pager: [phone]       After Hours (After 5p/  First Contact Pager: [phone]  weekends / holidays): Second Contact Pager: [phone]   Chief Complaint: Peripheral Edema  History of Present Illness:  Terry Parks is a 64 y/o male, with a PMH of  BPH, CHF, DMT2, HLD, HTN, and OSA, who presents to [facility] with peripheral edema. Patient states that shortly after his last primary care visit he has become short of breath and noticed swelling in his lower extremities. He states that his [facility] is present when he lies down, sits in a chair, and when he walks. Additionally, he states that he has been good about keeping his weight down, but has begun to notice that his pants are fitting tighter as are his wrist bands. He has been working with [facility], which has provided him with a new scale. He states that he went from "300 something to 325" on the new scale provided by [facility]. He does endorse being unable to sleep and has been staying up late at night, eating snacks. He arrived at a cardiology appointment on 08/10/19 and spoke with Almyra Deforest PA and Dr. Lyman Bishop MD, who found the patient to be volume overloaded, 3+ pitting edema in the lower extremities, with signs of acute on chronic congestive heart failure, and was instructed to go to [facility] ([place] by cardiology for diuresis, diabetes management, and monitor his renal function. He denies nausea, fatigue, vomiting, headaches, abdominal pain, constipation, or diarrhea. He does endorse a "tinge" of left sided chest pain with no radiation lasting seconds.  Additionally, patient states that his is having difficulty adhering to his diet, has been  snacking, and has been having difficulty managing his medications.    During his ED course, it was found that Cr was 1.43, BNP of 220, and a cbg of 185.   Meds:  Current Meds  Medication Sig  . acetaminophen (TYLENOL) 325 MG tablet Take 325-650 mg by mouth every 6 (six) hours as needed (for pain).  [name] amiodarone (PACERONE) 200 MG tablet Take 1 tablet (200 mg total) by mouth daily.  . bisacodyl (DULCOLAX) 5 MG EC tablet Take 5 mg by mouth daily as needed for moderate constipation.  . carvedilol (COREG) 25 MG tablet Take 1 tablet (25 mg total) by mouth 2 (two) times daily with a meal.  . DULoxetine (CYMBALTA) 60 MG capsule Take 1 capsule (60 mg total) by mouth daily.  [name] gabapentin (NEURONTIN) 300 MG capsule Take 1 capsule (300 mg total) by mouth 2 (two) times daily.  . Insulin Degludec-Liraglutide (XULTOPHY) 100-3.6 UNIT-MG/ML SOPN Inject 50 Units into the skin daily.  . isosorbide mononitrate (IMDUR) 30 MG 24 hr tablet Take 1 tablet (30 mg total) by mouth daily.  [name] losartan (COZAAR) 100 MG tablet Take 1 tablet (100 mg total) by mouth daily.  . metformin (FORTAMET) 1000 MG (OSM) 24 hr tablet Take 2 tablets (2,000 mg total) by mouth daily with breakfast. (Patient taking differently: Take 2,000 mg by mouth  2 (two) times daily with a meal. )  . omeprazole (PRILOSEC) 40 MG capsule Take 1 capsule (40 mg total) by mouth 2 (two) times daily before a meal.  . rivaroxaban (XARELTO) 20 MG TABS tablet Take 1 tablet (20 mg total) by mouth daily with breakfast. (Patient taking differently: Take 20 mg by mouth daily with supper. )  . spironolactone (ALDACTONE) 50 MG tablet Take 1 tablet (50 mg total) by mouth daily.  [name] torsemide (DEMADEX) 20 MG tablet Take 2.5 tablets (50 mg total) by mouth 2 (two) times daily. (Patient taking differently: Take 60 mg by mouth 2 (two) times daily. Take med before 5p)     Allergies: Allergies as of 08/10/2019 - Review Complete 08/10/2019  Allergen Reaction Noted  .  Lisinopril Cough 03/30/2012   Past Medical History:  Diagnosis Date  . Abscessed tooth    top back large cavity no pain or drainage, one on bottom  pt pulled tooth 4-5 months ago, right top large hole in tooth  . AKI (acute kidney injury) ([place])   . Allergic rhinitis   . Anemia   . Anxiety   . Asthma   . Atrial fibrillation ([place])   . BPH (benign prostatic hypertrophy)    Massive BPH noted on cystoscopy 1/23/ 2012 by Dr. Risa Grill.  . Cancer [facility])    prostate cancer 2019  . Cardiomyopathy ([place])   . CHF (congestive heart failure) ([place])   . Cough 03/30/2012  . Depression   . Diabetes mellitus 04/08/2008   type 2  . Foley catheter in place 07-05-17 placed  . Headache(784.0)    hx migraines none recent  . Hyperlipemia   . Hypertension   . Hypertensive cardiopathy 03/01/2006   2-D echocardiogram 02/01/2012 showed moderate LVH, mildly to moderately reduced left ventricular systolic function with an estimated ejection fraction of 40-45%, and diffuse hypokinesis.  A nuclear medicine stress study done 01/31/2012 showed no reversible ischemia, a small mid anterior wall fixed defect/infarct, and ejection fraction 42%.      . Neck pain   . Nephrolithiasis 05/29/2010   CT scan of abdomen/pelvis on 05/29/2010 showed an obstructing approximate 1-2 mm calculus at the left UVJ, and an approximate 1-2 mm left lower pole renal calculus.   Patient had continuing severe pain , and an elevation of his serum creatinine to a value of 1.75 on 06/06/2010.  Patient underwent cystoscopy on 06/08/2010 by Dr. Risa Grill, but attempts at retrograde pyelogram and ureteroscopy were unsucc  . Numbness 01/08/2018  . Obstructive sleep apnea 03/06/2008   Sleep study 03/06/08 showed severe OSA/hypopnea syndrome, with successful CPAP titration to 13 CWP using a medium ResMed Mirage Quattro full face mask with heated humidifier.   . Rash 04/17/2014  . Renal calculus 05/29/2010   CT scan of abdomen/pelvis on 05/29/2010 showed an  obstructing approximate 1-2 mm calculus at the left UVJ, and an approximate 1-2 mm left lower pole renal calculus.   Patient had continuing severe pain , and an elevation of his serum creatinine to a value of 1.75 on 06/06/2010.  The stone had apparently passed and was not seen on repeat CT 06/08/2010.  . Sleep apnea    haven't use cpap in 2 years  . Tooth pain 10/29/2017  . Urinary straining 11/02/2016    Family History:  Family History  Problem Relation Age of Onset  . Breast cancer Mother   . Hypertension Father   . Diabetes Maternal Grandmother   . Colon cancer Neg Hx   .  Prostate cancer Neg Hx   . Heart attack Neg Hx   . Esophageal cancer Neg Hx   . Inflammatory bowel disease Neg Hx   . Liver disease Neg Hx   . Pancreatic cancer Neg Hx   . Rectal cancer Neg Hx   . Stomach cancer Neg Hx     Social History:  Social History   Socioeconomic History  . Marital status: Single    Spouse name: Not on file  . Number of children: 4  . Years of education: 40  . Highest education level: Not on file  Occupational History  . Occupation:        Employer: UNEMPLOYED  Tobacco Use  . Smoking status: Never Smoker  . Smokeless tobacco: Never Used  Substance and Sexual Activity  . Alcohol use: No    Alcohol/week: 0.0 standard drinks  . Drug use: No  . Sexual activity: Not Currently  Other Topics Concern  . Not on file  Social History Narrative   Divorced, 4 children, lives alone.     Social Determinants of Health   Financial Resource Strain: Low Risk   . Difficulty of Paying Living Expenses: Not hard at all  Food Insecurity: No Food Insecurity  . Worried About Charity fundraiser in the Last Year: Never true  . Ran Out of Food in the Last Year: Never true  Transportation Needs: No Transportation Needs  . Lack of Transportation (Medical): No  . Lack of Transportation (Non-Medical): No  Physical Activity: Inactive  . Days of Exercise per Week: 0 days  . Minutes of Exercise per  Session: 0 min  Stress:   . Feeling of Stress :   Social Connections: Unknown  . Frequency of Communication with Friends and Family: More than three times a week  . Frequency of Social Gatherings with Friends and Family: More than three times a week  . Attends Religious Services: More than 4 times per year  . Active Member of Clubs or Organizations: Not on file  . Attends Archivist Meetings: Not on file  . Marital Status: Not on file   Review of Systems: A complete ROS was negative except as per HPI.  Physical Exam: Blood pressure (!) 156/84, pulse 80, temperature 98 F (36.7 C), resp. rate 17, SpO2 100 %. Physical Exam Constitutional:      Appearance: He is obese. He is not ill-appearing, toxic-appearing or diaphoretic.  HENT:     Head: Normocephalic and atraumatic.  Cardiovascular:     Rate and Rhythm: Normal rate and regular rhythm.     Heart sounds: No murmur. No friction rub. No gallop.      Comments: Elevated JVP 4-5cm Pulmonary:     Effort: Pulmonary effort is normal.     Breath sounds: Normal breath sounds. No wheezing, rhonchi or rales.  Chest:     Chest wall: No tenderness.  Abdominal:     General: Bowel sounds are normal.     Tenderness: There is no abdominal tenderness. There is no guarding.     Comments: Distended, non tender to palpation.   Musculoskeletal:     Right lower leg: Edema present.     Left lower leg: Edema present.     Comments: 2+ Pitting edema lower extremities bilaterally. Lichenification of the lower limbs bilaterally.  Skin:    General: Skin is warm.     Findings: No ecchymosis.  Neurological:     Mental Status: He is oriented to person, place,  and time.  Psychiatric:        Mood and Affect: Mood normal.        Behavior: Behavior normal.     EKG: personally reviewed my interpretation is NSR with LAD  CXR: personally reviewed my interpretation is The heart size is enlarged. There is no pneumothorax or large pleural effusion.  No acute osseous abnormality. No focal infiltrate.  Assessment & Plan by Problem: Active Problems:   Peripheral edema  Terry Parks is a 64 y/o male PMH of  BPH, CHF, DMT2, HLD, HTN, and OSA, presents to the [facility] [place] with for management of his hypervolemia/peripheral edema.   #HFrEF #Hypertension #Peripheral Edema:  Patient with a LVEF of 35-40% (02/23/2019) presents to the [facility] with clear signs of hypervolemia. Since his last PCP visit he has gone from 139 kg to 149 kg, has an elevated JVP, BNP, and pitting edema on physical examination. Underlying etiology most likely due to medical and dietary non-compliance. Troponins were flat with minimal EKG changes lower threshold of ACS or cardiac insult. BNP could be falsely low due to body habitus.  - Cardiology to consult on patient. We appreciate their recommendations - Daily weights - Strict in and outs - IV Lasix 120 mg in Dextrose 5% 50 mL IVPB, 62 mL/hr.  - Imdur 30 mg QD - Cozaar 100 mg QD - Aldactone 50 mg QD - Holding Coreg 25 mg  #Atrial Fibrillation: (06/06/2017) - Holding Coreg 25 mg - Continue Xarelto 20 mg tabs - Continue amiodarone 200 mg  #Diabetes Mellitis Type II:  - 40U Lantus  #Obstructive Sleep Apnea:  - CPAP ordered    Dispo: Admit patient to Observation with expected length of stay less than 2 midnights.  Signed: Maudie Mercury, MD 08/11/2019, 5:59 AM

## 2019-08-12 DIAGNOSIS — N183 Chronic kidney disease, stage 3 unspecified: Secondary | ICD-10-CM

## 2019-08-12 LAB — GLUCOSE, CAPILLARY
Glucose-Capillary: 106 mg/dL — ABNORMAL HIGH (ref 70–99)
Glucose-Capillary: 145 mg/dL — ABNORMAL HIGH (ref 70–99)
Glucose-Capillary: 154 mg/dL — ABNORMAL HIGH (ref 70–99)
Glucose-Capillary: 172 mg/dL — ABNORMAL HIGH (ref 70–99)
Glucose-Capillary: 238 mg/dL — ABNORMAL HIGH (ref 70–99)
Glucose-Capillary: 89 mg/dL (ref 70–99)

## 2019-08-12 LAB — BASIC METABOLIC PANEL
Anion gap: 9 (ref 5–15)
Anion gap: 9 (ref 5–15)
BUN: 9 mg/dL (ref 8–23)
BUN: 11 mg/dL (ref 8–23)
CO2: 28 mmol/L (ref 22–32)
CO2: 27 mmol/L (ref 22–32)
Calcium: 8.3 mg/dL — ABNORMAL LOW (ref 8.9–10.3)
Calcium: 8.3 mg/dL — ABNORMAL LOW (ref 8.9–10.3)
Chloride: 103 mmol/L (ref 98–111)
Chloride: 100 mmol/L (ref 98–111)
Creatinine, Ser: 1.27 mg/dL — ABNORMAL HIGH (ref 0.61–1.24)
Creatinine, Ser: 1.3 mg/dL — ABNORMAL HIGH (ref 0.61–1.24)
GFR calc Af Amer: >60 mL/min (ref >60)
GFR calc Af Amer: >60 mL/min (ref >60)
GFR calc non Af Amer: >60 mL/min (ref >60)
GFR calc non Af Amer: 59 mL/min — ABNORMAL LOW (ref >60)
Glucose, Bld: 109 mg/dL — ABNORMAL HIGH (ref 70–99)
Glucose, Bld: 184 mg/dL — ABNORMAL HIGH (ref 70–99)
Potassium: 4 mmol/L (ref 3.5–5.1)
Potassium: 4 mmol/L (ref 3.5–5.1)
Sodium: 140 mmol/L (ref 135–145)
Sodium: 136 mmol/L (ref 135–145)

## 2019-08-12 LAB — MAGNESIUM
Magnesium: 1.4 mg/dL — ABNORMAL LOW (ref 1.7–2.4)
Magnesium: 1.7 mg/dL (ref 1.7–2.4)

## 2019-08-12 MED ORDER — ALUM & MAG HYDROXIDE-SIMETH 200-200-20 MG/5ML PO SUSP
30.0000 mL | Freq: Four times a day (QID) | ORAL | Status: DC | PRN
  Administered 2019-08-12: 21:00:00 30 mL via ORAL
  Filled 2019-08-12: qty 30

## 2019-08-12 MED ORDER — MAGNESIUM SULFATE 2 GM/50ML IV SOLN
2.0000 g | Freq: Once | INTRAVENOUS | Status: AC
  Administered 2019-08-12: 17:00:00 2 g via INTRAVENOUS
  Filled 2019-08-12: qty 50

## 2019-08-12 MED ORDER — FUROSEMIDE 10 MG/ML IJ SOLN
120.0000 mg | Freq: Two times a day (BID) | INTRAVENOUS | Status: DC
  Administered 2019-08-12 – 2019-08-14 (×4): 120 mg via INTRAVENOUS
  Filled 2019-08-12 (×2): qty 12
  Filled 2019-08-12: qty 10
  Filled 2019-08-12: qty 12
  Filled 2019-08-12: qty 10
  Filled 2019-08-12: qty 12

## 2019-08-12 MED ORDER — MAGNESIUM SULFATE 2 GM/50ML IV SOLN
2.0000 g | INTRAVENOUS | Status: AC
  Administered 2019-08-12 (×2): 2 g via INTRAVENOUS
  Filled 2019-08-12 (×2): qty 50

## 2019-08-12 MED ORDER — INSULIN GLARGINE 100 UNIT/ML ~~LOC~~ SOLN
35.0000 [IU] | Freq: Every day | SUBCUTANEOUS | Status: DC
  Administered 2019-08-12 – 2019-08-13 (×2): 35 [IU] via SUBCUTANEOUS
  Filled 2019-08-12 (×3): qty 0.35

## 2019-08-12 NOTE — Progress Notes (Signed)
States he does not use cpap

## 2019-08-12 NOTE — Progress Notes (Addendum)
   Subjective: HD#1   Overnight: None  Today, Terry Parks reports that he experienced heartburn indigestion yesterday with heart fluttering while he was moving around in bed.  He denies shortness of breath.  Overall, he states that his shortness of breath, lower extremity edema have significantly improved since admission.  He is in good spirits and was under Bible study conference with his home charge  Objective:  Vital signs in last 24 hours: Vitals:   08/12/19 0018 08/12/19 0100 08/12/19 0359 08/12/19 0441  BP: (!) 128/108 (!) 147/80 (!) 176/101 (!) 164/103  Pulse: 79  78   Resp: 20 18 20 18   Temp: 97.9 F (36.6 C)  97.9 F (36.6 C)   TempSrc: Oral  Oral   SpO2: 100%  100%   Weight: (!) 145.9 kg      Const: In no apparent distress, lying comfortably in bed, conversational Resp: Few bibasilar crackles CV: RRR, no murmurs, gallop, rub Ext: 2+ right lower extremity pitting edema, 1+ left lower extremity edema   Assessment/Plan:  Principal Problem:   Acute on chronic HFrEF (heart failure with reduced ejection fraction) (HCC) Active Problems:   Obstructive sleep apnea   Type 2 diabetes mellitus with peripheral neuropathy (HCC)   Persistent atrial fibrillation   Stage 3 chronic kidney disease  Terry Parks is a 64 y.o. with pertinent PMH of BPH, HFrEF (EF 35-40%), DMT2, HLD, HTN, and OSA who presented with shortness of breath and lower extremity edema and admit for further evaluation and management CHF exacerbation on hospital day 1   #Acute on HFrEF (EF 35 to 40% 2020) Overall, he is improving on IV diuretics. UOP 5.1L. Weight this am 321.7 lbs, goal is ~308 lbs during this admission which seem to be his weight in February 2021. -Appreciate cardiology recommendations -Strict I&O, daily weights -Lasix 120 mg BID (Cardiology holding PM dose) -Imdur 30 mg daily -Cozaar 100 mg daily -Aldactone 50 mg daily -Coreg 25 mg daily.   #Atrial Fibrillation:  Remains  in sinus rhythm. Personally reviewedTele monitor and was unremarkable. Showed SR -Continue telemetry -Coreg 25 mg -Continue Xarelto 20 mg and amiodarone 200 mg   #Chronic Kidney Disease III sCr stable 1.4 -Avoid nephrotoxic agents   #Diabetes Mellitis Type II: -Lantus 35U daily -SSI resistant     FEN: Heart healthy VTE ppx: Xarelto CODE STATUS: Full code  Prior to Admission Living Arrangement: Home Anticipated Discharge Location: Home Barriers to Discharge: IV diuretics Dispo: Anticipated discharge in approximately 3-4 day(s).    Terry Rosenthal, MD 08/12/2019, 6:37 AM Pager: [phone] Internal Medicine Teaching Service

## 2019-08-12 NOTE — Progress Notes (Signed)
OT Cancellation Note  Patient Details Name: Terry Parks MRN: [id number] DOB: December 06, 1955   Cancelled Treatment:     Attempt to see patient this AM however patient politely states "it's too early ma'am." Will re-attempt as schedule permits.   [place] OT office: [place] 08/12/2019, 9:14 AM

## 2019-08-12 NOTE — Progress Notes (Signed)
Cardiology Progress Note  Patient ID: Terry Parks MRN: [id number] DOB: [birth year] Date of Encounter: 08/12/2019  Primary Cardiologist: [name], MD  Subjective  Net negative 4.2 L.  Creatinine continues to improve.  He feels better.  Still with 1-2+ lower extreme edema.  Magnesium 1.4.  He did receive his Lasix this morning.  ROS:  All other ROS reviewed and negative. Pertinent positives noted in the HPI.     Inpatient Medications  Scheduled Meds: . amiodarone  200 mg Oral Daily  . carvedilol  25 mg Oral BID WC  . cholecalciferol  1,000 Units Oral Daily  . DULoxetine  60 mg Oral Daily  . gabapentin  300 mg Oral BID  . insulin aspart  0-20 Units Subcutaneous Q4H  . insulin glargine  35 Units Subcutaneous Daily  . isosorbide mononitrate  30 mg Oral Daily  . losartan  100 mg Oral Daily  . pantoprazole  80 mg Oral Daily  . rivaroxaban  20 mg Oral Q breakfast  . sodium chloride flush  3 mL Intravenous Once  . spironolactone  50 mg Oral Daily   Continuous Infusions: . magnesium sulfate bolus IVPB     PRN Meds: acetaminophen **OR** acetaminophen, promethazine, senna-docusate   Vital Signs   Vitals:   08/12/19 0441 08/12/19 0500 08/12/19 0600 08/12/19 0758  BP: (!) 164/103 (!) 155/94 (!) 144/74 (!) 179/105  Pulse:    81  Resp: 18 17  18   Temp:    97.9 F (36.6 C)  TempSrc:    Oral  SpO2:    99%  Weight:        Intake/Output Summary (Last 24 hours) at 08/12/2019 1111 Last data filed at 08/12/2019 0900 Gross per 24 hour  Intake 1120 ml  Output 3175 ml  Net -2055 ml   Last 3 Weights 08/12/2019 08/10/2019 07/12/2019  Weight (lbs) 321 lb 11.2 oz 330 lb 3.2 oz 308 lb  Weight (kg) 145.922 kg 149.778 kg 139.708 kg      Telemetry  Overnight telemetry shows sinus rhythm heart rate 70-80, which I personally reviewed.   ECG  The most recent ECG shows normal sinus rhythm, nonspecific ST-T changes, which I personally reviewed.   Physical Exam   Vitals:   08/12/19  0441 08/12/19 0500 08/12/19 0600 08/12/19 0758  BP: (!) 164/103 (!) 155/94 (!) 144/74 (!) 179/105  Pulse:    81  Resp: 18 17  18   Temp:    97.9 F (36.6 C)  TempSrc:    Oral  SpO2:    99%  Weight:         Intake/Output Summary (Last 24 hours) at 08/12/2019 1111 Last data filed at 08/12/2019 0900 Gross per 24 hour  Intake 1120 ml  Output 3175 ml  Net -2055 ml    Last 3 Weights 08/12/2019 08/10/2019 07/12/2019  Weight (lbs) 321 lb 11.2 oz 330 lb 3.2 oz 308 lb  Weight (kg) 145.922 kg 149.778 kg 139.708 kg    Body mass index is 42.44 kg/m.  General: Well nourished, well developed, in no acute distress Head: Atraumatic, normal size  Eyes: PEERLA, EOMI  Neck: Supple, JVD 12-15 cm of water Endocrine: No thryomegaly Cardiac: Normal S1, S2; RRR; no murmurs, rubs, or gallops Lungs: Clear to auscultation bilaterally, no wheezing, rhonchi or rales  Abd: Soft, nontender, no hepatomegaly  Ext: 1-2 + pitting edema up to mid shins Musculoskeletal: No deformities, BUE and BLE strength normal and equal Skin: Warm and dry, no rashes  Neuro: Alert and oriented to person, place, time, and situation, CNII-XII grossly intact, no focal deficits  Psych: Normal mood and affect   Labs  High Sensitivity Troponin:   Recent Labs  Lab 08/10/19 2052 08/10/19 2300  TROPONINIHS 21* 15     Cardiac EnzymesNo results for input(s): TROPONINI in the last 168 hours. No results for input(s): TROPIPOC in the last 168 hours.  Chemistry Recent Labs  Lab 08/10/19 2052 08/11/19 0421 08/12/19 0727  NA 142 142 140  K 3.9 3.9 4.0  CL 111 110 103  CO2 23 23 28   GLUCOSE 109* 176* 109*  BUN 6* 5* 9  CREATININE 1.43* 1.40* 1.27*  CALCIUM 8.1* 8.0* 8.3*  GFRNONAA 52* 54* >60  GFRAA >60 >60 >60  ANIONGAP 8 9 9     Hematology Recent Labs  Lab 08/10/19 2052 08/11/19 0421  WBC 6.5 6.8  RBC 4.32 4.16*  HGB 12.0* 11.6*  HCT 37.7* 36.7*  MCV 87.3 88.2  MCH 27.8 27.9  MCHC 31.8 31.6  RDW 13.9 14.3  PLT 200  182   BNP Recent Labs  Lab 08/10/19 2107  BNP 220.7*    DDimer No results for input(s): DDIMER in the last 168 hours.   Radiology  DG Chest 2 View  Result Date: 08/10/2019 CLINICAL DATA:  Chest pain EXAM: CHEST - 2 VIEW COMPARISON:  April 23, 2019 FINDINGS: The heart size is enlarged. There is no pneumothorax or large pleural effusion. No acute osseous abnormality. No focal infiltrate. IMPRESSION: No active cardiopulmonary disease. Electronically Signed   By: Constance Holster M.D.   On: 08/10/2019 21:24    Cardiac Studies  TTE 02/23/2019 1. Left ventricular ejection fraction, by visual estimation, is 35 to  40%. The left ventricle has moderately decreased function. Normal left  ventricular size. There is moderate left ventricular hypertrophy. There is  blobal left ventricular hypokinesis,  without regional variation.  2. Left ventricular diastolic Doppler parameters are consistent with  impaired relaxation pattern of LV diastolic filling.  3. Global right ventricle has mildly reduced systolic function.The right  ventricular size is not well visualized. Right vetricular wall thickness  was not assessed.  4. Left atrial size was mildly dilated.  5. Right atrial size was mildly dilated.  6. The aortic valve is normal in structure. Aortic valve regurgitation  was not visualized by color flow Doppler.  7. The aortic root was not well visualized.  8. TR signal is inadequate for assessing pulmonary artery systolic  pressure.  9. The inferior vena cava is normal in size with greater than 50%  respiratory variability, suggesting right atrial pressure of 3 mmHg.  10. The mitral valve is normal in structure. No evidence of mitral valve  regurgitation.  11. The tricuspid valve is normal in structure. Tricuspid valve  regurgitation was not visualized by color flow Doppler.  12. The pulmonic valve was not well visualized. Pulmonic valve  regurgitation is not visualized by  color flow Doppler.   Patient Profile  Terry Parks is a 64 year old gentleman with known systolic heart failure (ejection fraction 35%), paroxysmal atrial fibrillation, hypertension, diabetes, OSA, morbid obesity who was admitted 08/11/2019 for acute decompensated systolic heart failure.  Assessment & Plan   1.  Acute on chronic systolic heart failure, ejection fraction 35% -Still volume overloaded.  Was admitted with mildly elevated BNP likely low in setting of obesity.  Troponins negative for ischemia.  No chest pain. -Net -4.2 L.  His magnesium is 1.4.  He did  receive his 120 mg IV Lasix today.  I have held his afternoon dose.  I will give him 2 g of magnesium every 2 hours for 2 doses.  We will need this rechecked in the afternoon before we give his afternoon dose of Lasix. -Warm and wet on examination.  I suspect he has 1-2 more days of diuresis. -Continue carvedilol 25 mg twice daily, losartan 100 mg daily, Aldactone 50 mg daily, Imdur 30 mg daily -Strict I's and O's -Salt restricted diet  2.  Paroxysmal A. Fib -In normal sinus rhythm.  Continue amiodarone 200 mg daily.  Continue Xarelto.  3.  CKD stage III -Kidney function improves with diuresis.  For questions or updates, please contact [place] Please consult [email or web address] for contact info under   Time Spent with Patient: I have spent a total of 35 minutes with patient reviewing hospital notes, telemetry, EKGs, labs and examining the patient as well as establishing an assessment and plan that was discussed with the patient.  > 50% of time was spent in direct patient care.    Signed, Addison Naegeli. Audie Box, [place]  08/12/2019 11:11 AM

## 2019-08-13 DIAGNOSIS — I4891 Unspecified atrial fibrillation: Secondary | ICD-10-CM

## 2019-08-13 LAB — BASIC METABOLIC PANEL
Anion gap: 12 (ref 5–15)
BUN: 13 mg/dL (ref 8–23)
CO2: 27 mmol/L (ref 22–32)
Calcium: 8.4 mg/dL — ABNORMAL LOW (ref 8.9–10.3)
Chloride: 99 mmol/L (ref 98–111)
Creatinine, Ser: 1.58 mg/dL — ABNORMAL HIGH (ref 0.61–1.24)
GFR calc Af Amer: 54 mL/min — ABNORMAL LOW (ref >60)
GFR calc non Af Amer: 47 mL/min — ABNORMAL LOW (ref >60)
Glucose, Bld: 113 mg/dL — ABNORMAL HIGH (ref 70–99)
Potassium: 3.2 mmol/L — ABNORMAL LOW (ref 3.5–5.1)
Sodium: 138 mmol/L (ref 135–145)

## 2019-08-13 LAB — GLUCOSE, CAPILLARY
Glucose-Capillary: 138 mg/dL — ABNORMAL HIGH (ref 70–99)
Glucose-Capillary: 141 mg/dL — ABNORMAL HIGH (ref 70–99)
Glucose-Capillary: 148 mg/dL — ABNORMAL HIGH (ref 70–99)
Glucose-Capillary: 162 mg/dL — ABNORMAL HIGH (ref 70–99)
Glucose-Capillary: 193 mg/dL — ABNORMAL HIGH (ref 70–99)
Glucose-Capillary: 215 mg/dL — ABNORMAL HIGH (ref 70–99)

## 2019-08-13 LAB — MAGNESIUM: Magnesium: 2.3 mg/dL (ref 1.7–2.4)

## 2019-08-13 MED ORDER — POTASSIUM CHLORIDE CRYS ER 20 MEQ PO TBCR
20.0000 meq | EXTENDED_RELEASE_TABLET | Freq: Two times a day (BID) | ORAL | Status: DC
  Administered 2019-08-13 – 2019-08-14 (×3): 20 meq via ORAL
  Filled 2019-08-13 (×3): qty 1

## 2019-08-13 NOTE — Evaluation (Signed)
Occupational Therapy Evaluation Patient Details Name: Terry Parks MRN: [id number] DOB: 1955-06-14 Today's Date: 08/13/2019    History of Present Illness 64 year old person living with obesity, diabetes, atrial fibrillation, and chronic kidney disease stage IIIa presented to the emergency department from his cardiology office for volume overload and admitted with acute on chronic heart failure with reduced ejection fraction.    Clinical Impression   Pt PTA: living at home alone and independent. Pt currently supervisionA level for ADL and mobility ~200' with RW. Pt with no SOB and O2 sats 90% on RA. Pt reports wanting assist in home for IADL (cooking/cleaning). Pt advised on LB AE, but pt stating that he has comfortable sneakers to slip on at home. Pt standing for ADL at sink and toileting at commode. Pt did not require any physical assist at this time. Pt appears at functional baseline for ADL and mobility. No further OT skilled services. OT signing off.    Follow Up Recommendations  No OT follow up    Equipment Recommendations  None recommended by OT    Recommendations for Other Services       Precautions / Restrictions Precautions Precautions: Fall Restrictions Weight Bearing Restrictions: No      Mobility Bed Mobility Overal bed mobility: Modified Independent                Transfers Overall transfer level: Needs assistance Equipment used: Rolling walker (2 wheeled) Transfers: Sit to/from Stand Sit to Stand: Supervision              Balance Overall balance assessment: No apparent balance deficits (not formally assessed)                                         ADL either performed or assessed with clinical judgement   ADL Overall ADL's : At baseline                                       General ADL Comments: Pt at baseline for ADL tasks; Pt states that he can reach BLEs with increased time,but continues to be a little  to swollen today to complete task. pt reports that he rarely wears socks. Pt supervisionA level for ambulation in room and hallway with RW ~200' and ADL at sink with no seated rest break required.      Vision Baseline Vision/History: Wears glasses Wears Glasses: Reading only Patient Visual Report: No change from baseline Vision Assessment?: No apparent visual deficits     Perception     Praxis      Pertinent Vitals/Pain Pain Assessment: Faces Faces Pain Scale: No hurt Pain Intervention(s): Monitored during session     Hand Dominance Right   Extremity/Trunk Assessment Upper Extremity Assessment Upper Extremity Assessment: Overall WFL for tasks assessed   Lower Extremity Assessment Lower Extremity Assessment: Overall WFL for tasks assessed   Cervical / Trunk Assessment Cervical / Trunk Assessment: Normal   Communication Communication Communication: No difficulties   Cognition Arousal/Alertness: Awake/alert Behavior During Therapy: WFL for tasks assessed/performed Overall Cognitive Status: Within Functional Limits for tasks assessed                                     General Comments  No SOB noted today. O2 >90% on RA. Pt explaining that he has LH sponge at home for washing BLEs.    Exercises     Shoulder Instructions      Home Living Family/patient expects to be discharged to:: Private residence   Available Help at Discharge: Family;Available PRN/intermittently Type of Home: Apartment Home Access: Stairs to enter Entrance Stairs-Number of Steps: 2 flights Entrance Stairs-Rails: Right;Left Home Layout: One level;Other (Comment)     Bathroom Shower/Tub: Teacher, early years/pre: Standard     Home Equipment: None          Prior Functioning/Environment Level of Independence: Independent        Comments: ride [employer] buses to appt's, family friends help with transport        OT Problem List:        OT  Treatment/Interventions:      OT Goals(Current goals can be found in the care plan section) Acute Rehab OT Goals Patient Stated Goal: to go home OT Goal Formulation: With patient Time For Goal Achievement: 08/27/19 Potential to Achieve Goals: Good  OT Frequency:     Barriers to D/C:            Co-evaluation              AM-PAC OT "6 Clicks" Daily Activity     Outcome Measure Help from another person eating meals?: None Help from another person taking care of personal grooming?: None Help from another person toileting, which includes using toliet, bedpan, or urinal?: None Help from another person bathing (including washing, rinsing, drying)?: A Little Help from another person to put on and taking off regular upper body clothing?: None Help from another person to put on and taking off regular lower body clothing?: A Little 6 Click Score: 22   End of Session Equipment Utilized During Treatment: Gait belt;Rolling walker Nurse Communication: Mobility status  Activity Tolerance: Patient tolerated treatment well Patient left: in bed;with call bell/phone within reach  OT Visit Diagnosis: Unsteadiness on feet (R26.81);Muscle weakness (generalized) (M62.81)                Time: [id number] OT Time Calculation (min): 23 min Charges:  OT General Charges $OT Visit: 1 Visit OT Evaluation $OT Eval Moderate Complexity: 1 Mod OT Treatments $Self Care/Home Management : 8-22 mins  Terry Parks, OTR/L Acute Rehabilitation Services Pager: [phone] Office: [phone]   Mekenzie Modeste  C 08/13/2019, 4:50 PM

## 2019-08-13 NOTE — Progress Notes (Signed)
Cardiology Progress Note  Patient ID: Terry Parks MRN: [id number] DOB: [birth year] Date of Encounter: 08/13/2019  Primary Cardiologist: [name], MD  Subjective  Net negative 4.2 L.  Creatinine continues to improve.  He feels better.  Still with 1-2+ lower extreme edema.  Magnesium 1.4.  He did receive his Lasix this morning.  ROS:  All other ROS reviewed and negative. Pertinent positives noted in the HPI.     Inpatient Medications  Scheduled Meds: . amiodarone  200 mg Oral Daily  . carvedilol  25 mg Oral BID WC  . cholecalciferol  1,000 Units Oral Daily  . DULoxetine  60 mg Oral Daily  . gabapentin  300 mg Oral BID  . insulin aspart  0-20 Units Subcutaneous Q4H  . insulin glargine  35 Units Subcutaneous Daily  . isosorbide mononitrate  30 mg Oral Daily  . losartan  100 mg Oral Daily  . pantoprazole  80 mg Oral Daily  . potassium chloride  20 mEq Oral BID  . rivaroxaban  20 mg Oral Q breakfast  . sodium chloride flush  3 mL Intravenous Once  . spironolactone  50 mg Oral Daily   Continuous Infusions: . furosemide 120 mg (08/13/19 0943)   PRN Meds: acetaminophen **OR** acetaminophen, alum & mag hydroxide-simeth, promethazine, senna-docusate   Vital Signs   Vitals:   08/12/19 0758 08/13/19 0210 08/13/19 0403 08/13/19 0848  BP: (!) 179/105  138/73 138/82  Pulse: 81  70 76  Resp: 18  19 16   Temp: 97.9 F (36.6 C)  97.9 F (36.6 C) 97.6 F (36.4 C)  TempSrc: Oral  Oral Oral  SpO2: 99%  100% 98%  Weight:  (!) 143.4 kg      Intake/Output Summary (Last 24 hours) at 08/13/2019 1027 Last data filed at 08/13/2019 1022 Gross per 24 hour  Intake 991.52 ml  Output 2850 ml  Net -1858.48 ml   Last 3 Weights 08/13/2019 08/12/2019 08/10/2019  Weight (lbs) 316 lb 1.6 oz 321 lb 11.2 oz 330 lb 3.2 oz  Weight (kg) 143.382 kg 145.922 kg 149.778 kg      Telemetry  Overnight telemetry shows sinus rhythm heart rate 70-80, which I personally reviewed.   ECG  The most  recent ECG shows normal sinus rhythm, nonspecific ST-T changes, which I personally reviewed.   Physical Exam   Vitals:   08/12/19 0758 08/13/19 0210 08/13/19 0403 08/13/19 0848  BP: (!) 179/105  138/73 138/82  Pulse: 81  70 76  Resp: 18  19 16   Temp: 97.9 F (36.6 C)  97.9 F (36.6 C) 97.6 F (36.4 C)  TempSrc: Oral  Oral Oral  SpO2: 99%  100% 98%  Weight:  (!) 143.4 kg       Intake/Output Summary (Last 24 hours) at 08/13/2019 1026 Last data filed at 08/13/2019 1022 Gross per 24 hour  Intake 991.52 ml  Output 2850 ml  Net -1858.48 ml   \Net neg 5.9 L   Last 3 Weights 08/13/2019 08/12/2019 08/10/2019  Weight (lbs) 316 lb 1.6 oz 321 lb 11.2 oz 330 lb 3.2 oz  Weight (kg) 143.382 kg 145.922 kg 149.778 kg    Body mass index is 41.7 kg/m.  General: Well nourished, well developed, in no acute distress Head: Atraumatic, normal size  Eyes: PEERLA, EOMI  Neck: Supple, JVP increased    Endocrine: No thryomegaly Cardiac: Normal S1, S2; RRR; no murmurs, rubs, or gallops Lungs: Clear to auscultation bilaterally, no wheezing, rhonchi or rales  Abd:  Soft, nontender, no hepatomegaly  Ext: Tr to 1 + pitting edema up to mid shins Musculoskeletal: No deformities, BUE and BLE strength normal and equal Skin: Warm and dry, no rashes   Neuro: Alert and oriented to person, place, time, and situation, CNII-XII grossly intact, no focal deficits  Psych: Normal mood and affect   Labs  High Sensitivity Troponin:   Recent Labs  Lab 08/10/19 2052 08/10/19 2300  TROPONINIHS 21* 15     Cardiac EnzymesNo results for input(s): TROPONINI in the last 168 hours. No results for input(s): TROPIPOC in the last 168 hours.  Chemistry Recent Labs  Lab 08/12/19 0727 08/12/19 1446 08/13/19 0545  NA 140 136 138  K 4.0 4.0 3.2*  CL 103 100 99  CO2 28 27 27   GLUCOSE 109* 184* 113*  BUN 9 11 13   CREATININE 1.27* 1.30* 1.58*  CALCIUM 8.3* 8.3* 8.4*  GFRNONAA >60 59* 47*  GFRAA >60 >60 54*  ANIONGAP 9 9  12     Hematology Recent Labs  Lab 08/10/19 2052 08/11/19 0421  WBC 6.5 6.8  RBC 4.32 4.16*  HGB 12.0* 11.6*  HCT 37.7* 36.7*  MCV 87.3 88.2  MCH 27.8 27.9  MCHC 31.8 31.6  RDW 13.9 14.3  PLT 200 182   BNP Recent Labs  Lab 08/10/19 2107  BNP 220.7*    DDimer No results for input(s): DDIMER in the last 168 hours.   Radiology  No results found.  Cardiac Studies  TTE 02/23/2019 1. Left ventricular ejection fraction, by visual estimation, is 35 to  40%. The left ventricle has moderately decreased function. Normal left  ventricular size. There is moderate left ventricular hypertrophy. There is  blobal left ventricular hypokinesis,  without regional variation.  2. Left ventricular diastolic Doppler parameters are consistent with  impaired relaxation pattern of LV diastolic filling.  3. Global right ventricle has mildly reduced systolic function.The right  ventricular size is not well visualized. Right vetricular wall thickness  was not assessed.  4. Left atrial size was mildly dilated.  5. Right atrial size was mildly dilated.  6. The aortic valve is normal in structure. Aortic valve regurgitation  was not visualized by color flow Doppler.  7. The aortic root was not well visualized.  8. TR signal is inadequate for assessing pulmonary artery systolic  pressure.  9. The inferior vena cava is normal in size with greater than 50%  respiratory variability, suggesting right atrial pressure of 3 mmHg.  10. The mitral valve is normal in structure. No evidence of mitral valve  regurgitation.  11. The tricuspid valve is normal in structure. Tricuspid valve  regurgitation was not visualized by color flow Doppler.  12. The pulmonic valve was not well visualized. Pulmonic valve  regurgitation is not visualized by color flow Doppler.   Patient Profile  Mr. Wiedenhoeft is a 64 year old gentleman with known systolic heart failure (ejection fraction 35%), paroxysmal atrial  fibrillation, hypertension, diabetes, OSA, morbid obesity who was admitted 08/11/2019 for acute decompensated systolic heart failure.  Assessment & Plan   1.  Acute on chronic systolic heart failure, ejection fraction 35% -pt is diuresing , volume improved from admit  .  Was admitted with mildly elevated BNP likely low in setting of obesity.  Troponins negative for ischemia.  No chest pain. -Net -5.9 L   Minimall bump in Cr   -Continue carvedilol 25 mg twice daily, losartan 100 mg daily, Aldactone 50 mg daily, Imdur 30 mg daily -Strict I's and  O's -Salt restricted diet  2.  Paroxysmal A. Fib -Pt remains in normal sinus rhythm.  Continue amiodarone 200 mg daily.  Continue Xarelto.  3.  CKD stage III -Kidney function itook bump   1.59 today      BUN OK  COntinue    For questions or updates, please contact [facility] Please consult [email or web address] for contact info under   Time Spent with Patient: I have spent a total of 35 minutes with patient reviewing hospital notes, telemetry, EKGs, labs and examining the patient as well as establishing an assessment and plan that was discussed with the patient.  > 50% of time was spent in direct patient care.    Nevin Bloodgood RossMD 08/13/2019

## 2019-08-13 NOTE — Progress Notes (Addendum)
Subjective: HD#2 Events Overnight: no events overnight.  Patient was seen this morning on rounds. Terry Parks states he is feeling better in regards to his breathing but is feeling very tired. He has walked around the room but has not worked with PT as they came very early. He is interested in working with PT though.   We discussed plan to continue diuresing. Patient expressed understanding. All questions and concerns addressed.   Objective:  Vital signs in last 24 hours: Vitals:   08/12/19 0600 08/12/19 0758 08/13/19 0210 08/13/19 0403  BP: (!) 144/74 (!) 179/105  138/73  Pulse:  81  70  Resp:  18  19  Temp:  97.9 F (36.6 C)    TempSrc:  Oral    SpO2:  99%  100%  Weight:   (!) 143.4 kg    Supplemental O2: RA   Physical Exam: Physical Exam  Constitutional: He is oriented to person, place, and time and well-developed, well-nourished, and in no distress.  HENT:  Head: Normocephalic and atraumatic.  Eyes: EOM are normal.  Cardiovascular: Normal rate and intact distal pulses.  Pulmonary/Chest: No respiratory distress. He has no wheezes.  Abdominal: Soft. He exhibits no distension. There is no abdominal tenderness.  Musculoskeletal:        General: Edema present. Normal range of motion.     Cervical back: Normal range of motion.  Neurological: He is alert and oriented to person, place, and time.  Skin: Skin is warm and dry.  Psychiatric: Affect normal.    Filed Weights   08/12/19 0018 08/13/19 0210  Weight: (!) 145.9 kg (!) 143.4 kg     Intake/Output Summary (Last 24 hours) at 08/13/2019 0555 Last data filed at 08/13/2019 0500 Gross per 24 hour  Intake 751.52 ml  Output 1800 ml  Net -1048.48 ml  Net: -5.343 L since admission  Weight Net: - 6 kg  Risk Score: N/A    Pertinent labs/Imaging: CBC Latest Ref Rng & Units 08/11/2019 08/10/2019 04/23/2019  WBC 4.0 - 10.5 K/uL 6.8 6.5 8.0  Hemoglobin 13.0 - 17.0 g/dL 11.6(L) 12.0(L) 14.2  Hematocrit 39.0 - 52.0 %  36.7(L) 37.7(L) 44.2  Platelets 150 - 400 K/uL 182 200 178    CMP Latest Ref Rng & Units 08/12/2019 08/12/2019 08/11/2019  Glucose 70 - 99 mg/dL 184(H) 109(H) 176(H)  BUN 8 - 23 mg/dL 11 9 5(L)  Creatinine 0.61 - 1.24 mg/dL 1.30(H) 1.27(H) 1.40(H)  Sodium 135 - 145 mmol/L 136 140 142  Potassium 3.5 - 5.1 mmol/L 4.0 4.0 3.9  Chloride 98 - 111 mmol/L 100 103 110  CO2 22 - 32 mmol/L 27 28 23   Calcium 8.9 - 10.3 mg/dL 8.3(L) 8.3(L) 8.0(L)  Total Protein 6.5 - 8.1 g/dL - - -  Total Bilirubin 0.3 - 1.2 mg/dL - - -  Alkaline Phos 38 - 126 U/L - - -  AST 15 - 41 U/L - - -  ALT 0 - 44 U/L - - -     Assessment/Plan:  Principal Problem:   Acute on chronic HFrEF (heart failure with reduced ejection fraction) (HCC) Active Problems:   Obstructive sleep apnea   Type 2 diabetes mellitus with peripheral neuropathy (HCC)   Persistent atrial fibrillation   Stage 3 chronic kidney disease    Patient Summary: Terry Parks is a 64 y.o. with pertinent PMH of BPH,HFrEF (EF 35-40%), DMT2, HLD, HTN, and OSA who presented with shortness of breath and lower extremity edema and admit for  further evaluation and management CHF exacerbation on hospital day 2   #Acute on HFrEF (EF 35 to 40% 2020) Patient improving with diuretics.  Net of 5.3 L down and 6 kg drop in weight since admission.  Afternoon dose of Lasix was held yesterday due to low serum at 1.4.  This morning magnesium is 2.4. Continue diuresing today. He will need cardiac rehabilitation at discharge.  -Strict INO's Daily weights -120 mg twice daily -Imdur 30 mg daily -Cozaar 100 mg daily -Aldactone 50 mg -Coreg 25 mg daily -Consider cardiac rehabilitation.  #Atrial Fibrillation: Sinus rhythm on telemetry. -Continue telemetry, Coreg 25 mg, Xarelto 20 mg, and amiodarone 200 mg  #Chronic Kidney Disease(Stage III A) -Creatinine stable at 1.58 -Avoid nephrotoxic medications when possible  #Diabetes Mellitis Type II: -Continue  Lantus 35 units daily and sliding scale insulin resistant  Diet: Heart Healthy IVF: None,None VTE: Enoxaparin Code: Full PT/OT recs: None  TOC recs: none  Dispo: Anticipated discharge 1-3 days.    Marianna Payment, D.O. [facility], PGY-1 Date 08/13/2019 Time 5:55 AM

## 2019-08-14 ENCOUNTER — Ambulatory Visit: Payer: Medicaid Other | Admitting: Gastroenterology

## 2019-08-14 LAB — GLUCOSE, CAPILLARY
Glucose-Capillary: 158 mg/dL — ABNORMAL HIGH (ref 70–99)
Glucose-Capillary: 162 mg/dL — ABNORMAL HIGH (ref 70–99)
Glucose-Capillary: 178 mg/dL — ABNORMAL HIGH (ref 70–99)
Glucose-Capillary: 211 mg/dL — ABNORMAL HIGH (ref 70–99)
Glucose-Capillary: 249 mg/dL — ABNORMAL HIGH (ref 70–99)
Glucose-Capillary: 59 mg/dL — ABNORMAL LOW (ref 70–99)

## 2019-08-14 LAB — BASIC METABOLIC PANEL
Anion gap: 11 (ref 5–15)
BUN: 19 mg/dL (ref 8–23)
CO2: 24 mmol/L (ref 22–32)
Calcium: 8.5 mg/dL — ABNORMAL LOW (ref 8.9–10.3)
Chloride: 99 mmol/L (ref 98–111)
Creatinine, Ser: 1.57 mg/dL — ABNORMAL HIGH (ref 0.61–1.24)
GFR calc Af Amer: 54 mL/min — ABNORMAL LOW (ref >60)
GFR calc non Af Amer: 47 mL/min — ABNORMAL LOW (ref >60)
Glucose, Bld: 218 mg/dL — ABNORMAL HIGH (ref 70–99)
Potassium: 4.4 mmol/L (ref 3.5–5.1)
Sodium: 134 mmol/L — ABNORMAL LOW (ref 135–145)

## 2019-08-14 MED ORDER — INSULIN GLARGINE 100 UNIT/ML ~~LOC~~ SOLN
40.0000 [IU] | Freq: Every day | SUBCUTANEOUS | Status: DC
  Administered 2019-08-14: 40 [IU] via SUBCUTANEOUS
  Filled 2019-08-14: qty 0.4

## 2019-08-14 NOTE — Progress Notes (Signed)
Cardiology Progress Note  Patient ID: Terry Parks MRN: [id number] DOB: [birth year] Date of Encounter: 08/14/2019  Primary Cardiologist: [name], MD  Subjective      Pt denies CP  No SOB  Inpatient Medications  Scheduled Meds: . amiodarone  200 mg Oral Daily  . carvedilol  25 mg Oral BID WC  . cholecalciferol  1,000 Units Oral Daily  . DULoxetine  60 mg Oral Daily  . gabapentin  300 mg Oral BID  . insulin aspart  0-20 Units Subcutaneous Q4H  . insulin glargine  40 Units Subcutaneous Daily  . isosorbide mononitrate  30 mg Oral Daily  . losartan  100 mg Oral Daily  . pantoprazole  80 mg Oral Daily  . potassium chloride  20 mEq Oral BID  . rivaroxaban  20 mg Oral Q breakfast  . sodium chloride flush  3 mL Intravenous Once  . spironolactone  50 mg Oral Daily   Continuous Infusions: . furosemide 120 mg (08/13/19 1653)   PRN Meds: acetaminophen **OR** acetaminophen, alum & mag hydroxide-simeth, promethazine, senna-docusate   Vital Signs   Vitals:   08/13/19 0403 08/13/19 0848 08/13/19 1347 08/14/19 0351  BP: 138/73 138/82 109/61 130/77  Pulse: 70 76 67 65  Resp: 19 16 17 18   Temp: 97.9 F (36.6 C) 97.6 F (36.4 C) 97.6 F (36.4 C) 97.8 F (36.6 C)  TempSrc: Oral Oral Oral Oral  SpO2: 100% 98% 99% 96%  Weight:    (!) 144.3 kg    Intake/Output Summary (Last 24 hours) at 08/14/2019 0835 Last data filed at 08/14/2019 0200 Gross per 24 hour  Intake 600 ml  Output 1900 ml  Net -1300 ml   NEt neg 6.9 L  Last 3 Weights 08/14/2019 08/13/2019 08/12/2019  Weight (lbs) 318 lb 1.6 oz 316 lb 1.6 oz 321 lb 11.2 oz  Weight (kg) 144.289 kg 143.382 kg 145.922 kg      Telemetry  SR--  I  Personally reviewed   ECG   Not done   Physical Exam   Vitals:   08/13/19 0403 08/13/19 0848 08/13/19 1347 08/14/19 0351  BP: 138/73 138/82 109/61 130/77  Pulse: 70 76 67 65  Resp: 19 16 17 18   Temp: 97.9 F (36.6 C) 97.6 F (36.4 C) 97.6 F (36.4 C) 97.8 F (36.6 C)    TempSrc: Oral Oral Oral Oral  SpO2: 100% 98% 99% 96%  Weight:    (!) 144.3 kg     Intake/Output Summary (Last 24 hours) at 08/14/2019 0835 Last data filed at 08/14/2019 0200 Gross per 24 hour  Intake 600 ml  Output 1900 ml  Net -1300 ml   \Net neg 6.7  Last 3 Weights 08/14/2019 08/13/2019 08/12/2019  Weight (lbs) 318 lb 1.6 oz 316 lb 1.6 oz 321 lb 11.2 oz  Weight (kg) 144.289 kg 143.382 kg 145.922 kg    Body mass index is 41.97 kg/m.    General:  Morbidly obese 64 yo , in no acute distress Neck:  Neck is full Cardiac: Normal S1, S2; RRR; no murmurs, rubs, or gallops Lungs: Relatively clear to auscultation bilaterally, no wheezing, rhonchi or rales  Abd: Soft, nontender, no hepatomegaly  Ext: Tr  LE edema  Skin: Warm and dry, no rashes   Neuro: Deferred    Labs  High Sensitivity Troponin:   Recent Labs  Lab 08/10/19 2052 08/10/19 2300  TROPONINIHS 21* 15     Cardiac EnzymesNo results for input(s): TROPONINI in the last 168 hours.  No results for input(s): TROPIPOC in the last 168 hours.  Chemistry Recent Labs  Lab 08/12/19 1446 08/13/19 0545 08/14/19 0459  NA 136 138 134*  K 4.0 3.2* 4.4  CL 100 99 99  CO2 27 27 24   GLUCOSE 184* 113* 218*  BUN 11 13 19   CREATININE 1.30* 1.58* 1.57*  CALCIUM 8.3* 8.4* 8.5*  GFRNONAA 59* 47* 47*  GFRAA >60 54* 54*  ANIONGAP 9 12 11     Hematology Recent Labs  Lab 08/10/19 2052 08/11/19 0421  WBC 6.5 6.8  RBC 4.32 4.16*  HGB 12.0* 11.6*  HCT 37.7* 36.7*  MCV 87.3 88.2  MCH 27.8 27.9  MCHC 31.8 31.6  RDW 13.9 14.3  PLT 200 182   BNP Recent Labs  Lab 08/10/19 2107  BNP 220.7*    DDimer No results for input(s): DDIMER in the last 168 hours.   Radiology  No results found.  Cardiac Studies  TTE 02/23/2019 1. Left ventricular ejection fraction, by visual estimation, is 35 to  40%. The left ventricle has moderately decreased function. Normal left  ventricular size. There is moderate left ventricular hypertrophy.  There is  blobal left ventricular hypokinesis,  without regional variation.  2. Left ventricular diastolic Doppler parameters are consistent with  impaired relaxation pattern of LV diastolic filling.  3. Global right ventricle has mildly reduced systolic function.The right  ventricular size is not well visualized. Right vetricular wall thickness  was not assessed.  4. Left atrial size was mildly dilated.  5. Right atrial size was mildly dilated.  6. The aortic valve is normal in structure. Aortic valve regurgitation  was not visualized by color flow Doppler.  7. The aortic root was not well visualized.  8. TR signal is inadequate for assessing pulmonary artery systolic  pressure.  9. The inferior vena cava is normal in size with greater than 50%  respiratory variability, suggesting right atrial pressure of 3 mmHg.  10. The mitral valve is normal in structure. No evidence of mitral valve  regurgitation.  11. The tricuspid valve is normal in structure. Tricuspid valve  regurgitation was not visualized by color flow Doppler.  12. The pulmonic valve was not well visualized. Pulmonic valve  regurgitation is not visualized by color flow Doppler.   Patient Profile  Mr. Ducker is a 64 year old gentleman with known systolic heart failure (ejection fraction 35%), paroxysmal atrial fibrillation, hypertension, diabetes, OSA, morbid obesity who was admitted 08/11/2019 for acute decompensated systolic heart failure.  Assessment & Plan   1.  Acute on chronic systolic heart failure, ejection fraction 35% -pt is has diuresed close to 7 L   -Continue carvedilol 25 mg twice daily, losartan 100 mg daily, Aldactone 50 mg daily, Imdur 30 mg daily  COntinues on IV lasix -Strict I's and O's -Salt restricted diet  2.  Paroxysmal A. Fib -Currently staying in CR  .  Cont amiodarone 200 mg daily.  Continue Xarelto.  3.  CKD stage III -Cr 1.57   For questions or updates, please contact [facility] Please consult [email or web address] for contact info under   Time Spent with Patient: I have spent a total of 35 minutes with patient reviewing hospital notes, telemetry, EKGs, labs and examining the patient as well as establishing an assessment and plan that was discussed with the patient.  > 50% of time was spent in direct patient care.    Nevin Bloodgood RossMD 08/14/2019

## 2019-08-14 NOTE — Progress Notes (Signed)
Internal Medicine Clinic Resident  I have personally reviewed this encounter including the documentation in this note and/or discussed this patient with the care management provider. I will address any urgent items identified by the care management provider and will communicate my actions to the patient's PCP. I have reviewed the patient's CCM visit with my supervising attending.  Daphene Chisholm, MD 08/14/2019    

## 2019-08-14 NOTE — TOC Progression Note (Signed)
Transition of Care [facility]) - Progression Note    Patient Details  Name: Terry Parks MRN: [id number] Date of Birth: 1955-05-20  Transition of Care [facility]) CM/SW Contact  Zenon Mayo, RN Phone Number: 08/14/2019, 3:59 PM  Clinical Narrative:    NCM spoke with patient, he states he has transportation at discharge and he has Medicaid insurance for his medications. He has no needs at this time.        Expected Discharge Plan and Services           Expected Discharge Date: 08/14/19                                     Social Determinants of Health (SDOH) Interventions    Readmission Risk Interventions No flowsheet data found.

## 2019-08-14 NOTE — Progress Notes (Signed)
Subjective: HD#3 Events Overnight: No events overnight  Patient was seen this morning on rounds. Terry Parks states he is feeling tired today but denies SOB. He says he feels back to normal now. He walked yesterday and it went better than prior to admission. He has two flights of stairs at home and thinks it could climb them quicker now. He denies any salt intake while hospitalized.   We discussed plan to coordinate diuretics with Cardiology. He is in agreement with this plan. All questions and concerns addressed.  Objective:  Vital signs in last 24 hours: Vitals:   08/13/19 0403 08/13/19 0848 08/13/19 1347 08/14/19 0351  BP: 138/73 138/82 109/61 130/77  Pulse: 70 76 67 65  Resp: 19 16 17 18   Temp: 97.9 F (36.6 C) 97.6 F (36.4 C) 97.6 F (36.4 C) 97.8 F (36.6 C)  TempSrc: Oral Oral Oral Oral  SpO2: 100% 98% 99% 96%  Weight:    (!) 144.3 kg   Supplemental O2: Room air  Physical Exam: Physical Exam  Constitutional: He is oriented to person, place, and time and well-developed, well-nourished, and in no distress.  HENT:  Head: Normocephalic and atraumatic.  Eyes: EOM are normal.  Cardiovascular: Normal rate.  Pulmonary/Chest: Effort normal and breath sounds normal. No respiratory distress.  Abdominal: Soft. He exhibits no distension.  Musculoskeletal:        General: Normal range of motion.     Cervical back: Normal range of motion.  Neurological: He is alert and oriented to person, place, and time.  Skin: Skin is warm and dry.    Filed Weights   08/12/19 0018 08/13/19 0210 08/14/19 0351  Weight: (!) 145.9 kg (!) 143.4 kg (!) 144.3 kg     Intake/Output Summary (Last 24 hours) at 08/14/2019 0542 Last data filed at 08/13/2019 1853 Gross per 24 hour  Intake 600 ml  Output 1100 ml  Net -500 ml  Net: -5.9 L since admission  Risk Score:  N/A  Pertinent labs/Imaging: CBC Latest Ref Rng & Units 08/11/2019 08/10/2019 04/23/2019  WBC 4.0 - 10.5 K/uL 6.8 6.5 8.0    Hemoglobin 13.0 - 17.0 g/dL 11.6(L) 12.0(L) 14.2  Hematocrit 39.0 - 52.0 % 36.7(L) 37.7(L) 44.2  Platelets 150 - 400 K/uL 182 200 178    CMP Latest Ref Rng & Units 08/13/2019 08/12/2019 08/12/2019  Glucose 70 - 99 mg/dL 113(H) 184(H) 109(H)  BUN 8 - 23 mg/dL 13 11 9   Creatinine 0.61 - 1.24 mg/dL 1.58(H) 1.30(H) 1.27(H)  Sodium 135 - 145 mmol/L 138 136 140  Potassium 3.5 - 5.1 mmol/L 3.2(L) 4.0 4.0  Chloride 98 - 111 mmol/L 99 100 103  CO2 22 - 32 mmol/L 27 27 28   Calcium 8.9 - 10.3 mg/dL 8.4(L) 8.3(L) 8.3(L)  Total Protein 6.5 - 8.1 g/dL - - -  Total Bilirubin 0.3 - 1.2 mg/dL - - -  Alkaline Phos 38 - 126 U/L - - -  AST 15 - 41 U/L - - -  ALT 0 - 44 U/L - - -    Assessment/Plan:  Principal Problem:   Acute on chronic HFrEF (heart failure with reduced ejection fraction) (HCC) Active Problems:   Obstructive sleep apnea   Type 2 diabetes mellitus with peripheral neuropathy (HCC)   Persistent atrial fibrillation   Stage 3 chronic kidney disease    Patient Summary: Terry Parks is a 64 y.o. with pertinent PMH of  BPH,HFrEF (EF 35-40%), DMT2, HLD, HTN, and OSA  who presented with  shortness of breath and lower extremity edema  and admit for CHF exacerbation  on hospital day 3  #Acute onHFrEF (EF 35 to 40% 2020) Patient only had about 500 mL urine output overnight.  Patient is currently 144.3 kg.  Dry weight is close to 139-140 kg.  We are getting close to the patient's dry weight and he admits to symptom improvement and improved physical stamina. -Consider transitioning to oral diuretics today as patient is stable for discharge pending cardiology recommendations. -Continue strict INO's, daily weights -Continue Imdur 30 mg daily, Cozaar 100 mg daily, Aldactone 50 mg, Coreg 25 mg daily -We will set the patient up with cardiac rehabilitation at discharge.  #Atrial Fibrillation: Patient continues to be in sinus rhythm. -Continue telemetry, Coreg 25 mg, Xarelto 20 mg,  amiodarone milligrams  #Chronic Kidney Disease(Stage III A) Patient's kidney function is stable.  Continue to monitor creatinine daily. -Repeat BMP daily.   #Diabetes Mellitis Type II: Patient's inpatient glucose is within range with the exception of morning post prandial blood glucose levels of 238 at 11:30 AM.  This is consistent with the majority of his morning postprandial glucose levels.  Patient has wide variation in his daily glucose levels with majority of his values per within range of goal during hospitalization.  Aim to continue to keep his blood glucose less than 160. -Continue Lantus 35 units daily and  -Continue SSI resistant   Diet: Carb-Modified IVF: None,None VTE: Enoxaparin Code: Full PT/OT recs: They do not recommend any follow-up home health or SNF placement for further therapy. TOC recs: Discharge home when medically ready   Dispo: Anticipated discharge 1 to 2 days.    Marianna Payment, D.O. [facility], PGY-1 Date 08/14/2019 Time 5:43 AM

## 2019-08-14 NOTE — Progress Notes (Signed)
Physical Therapy Treatment Patient Details Name: Terry Parks MRN: [id number] DOB: Mar 26, 1956 Today's Date: 08/14/2019    History of Present Illness 64 year old person living with obesity, diabetes, atrial fibrillation, and chronic kidney disease stage IIIa presented to the emergency department from his cardiology office for volume overload and admitted with acute on chronic heart failure with reduced ejection fraction.     PT Comments    Patient received in bed, agrees to PT session. Reports he is feeling better. No SOB, no pain reported. Patient is mod independent with bed mobility, performed sit to stand with supervision. He is able to ambulate 200 feet without AD and min guard. No lob, mildly fatigued with ambulation. Declined stair ambulation this visit stating he can do it. Patient will continue to benefit from skilled PT while here to improve strength and activity tolerance for safe return home.       Follow Up Recommendations  No PT follow up;Supervision - Intermittent     Equipment Recommendations  None recommended by PT    Recommendations for Other Services       Precautions / Restrictions Precautions Precautions: Fall Restrictions Weight Bearing Restrictions: No    Mobility  Bed Mobility Overal bed mobility: Modified Independent Bed Mobility: Supine to Sit     Supine to sit: Modified independent (Device/Increase time)     General bed mobility comments: use of bed rail and mild increased effort  Transfers Overall transfer level: Needs assistance   Transfers: Sit to/from Stand Sit to Stand: Supervision            Ambulation/Gait Ambulation/Gait assistance: Min guard Gait Distance (Feet): 200 Feet Assistive device: None Gait Pattern/deviations: Step-through pattern Gait velocity: WFL   General Gait Details: wide BOS, slow cadence.   Stairs             Wheelchair Mobility    Modified Rankin (Stroke Patients Only)       Balance  Overall balance assessment: No apparent balance deficits (not formally assessed)                                          Cognition Arousal/Alertness: Awake/alert Behavior During Therapy: WFL for tasks assessed/performed Overall Cognitive Status: Within Functional Limits for tasks assessed                                        Exercises      General Comments        Pertinent Vitals/Pain Pain Assessment: No/denies pain    Home Living                      Prior Function            PT Goals (current goals can now be found in the care plan section) Acute Rehab PT Goals Patient Stated Goal: to go home PT Goal Formulation: With patient Time For Goal Achievement: 08/25/19 Potential to Achieve Goals: Good Progress towards PT goals: Progressing toward goals    Frequency    Min 3X/week      PT Plan Current plan remains appropriate    Co-evaluation              AM-PAC PT "6 Clicks" Mobility   Outcome Measure  Help needed turning from your back  to your side while in a flat bed without using bedrails?: None Help needed moving from lying on your back to sitting on the side of a flat bed without using bedrails?: None Help needed moving to and from a bed to a chair (including a wheelchair)?: None Help needed standing up from a chair using your arms (e.g., wheelchair or bedside chair)?: None Help needed to walk in hospital room?: None Help needed climbing 3-5 steps with a railing? : A Little 6 Click Score: 23    End of Session Equipment Utilized During Treatment: Gait belt Activity Tolerance: Patient tolerated treatment well Patient left: in chair;with call bell/phone within reach Nurse Communication: Mobility status PT Visit Diagnosis: Difficulty in walking, not elsewhere classified (R26.2)     Time: [id number] PT Time Calculation (min) (ACUTE ONLY): 13 min  Charges:  $Gait Training: 8-22 mins                      Abbagale Goguen, PT, GCS 08/14/19,1:50 PM

## 2019-08-15 ENCOUNTER — Ambulatory Visit: Payer: Medicaid Other | Admitting: Physician Assistant

## 2019-08-15 ENCOUNTER — Telehealth: Payer: Self-pay | Admitting: Internal Medicine

## 2019-08-15 NOTE — Progress Notes (Signed)
Internal Medicine Clinic Attending  CCM services provided by the care management provider and their documentation were discussed with Dr. Harbrecht. We reviewed the pertinent findings, urgent action items addressed by the resident and non-urgent items to be addressed by the PCP.  I agree with the assessment, diagnosis, and plan of care documented in the CCM and resident's note.  Danon Lograsso, MD 08/15/2019 

## 2019-08-15 NOTE — Telephone Encounter (Signed)
TOC appt sch on 08/23/2019 with [facility].

## 2019-08-16 ENCOUNTER — Ambulatory Visit: Payer: Medicaid Other | Admitting: *Deleted

## 2019-08-16 DIAGNOSIS — I5042 Chronic combined systolic (congestive) and diastolic (congestive) heart failure: Secondary | ICD-10-CM

## 2019-08-16 DIAGNOSIS — E1142 Type 2 diabetes mellitus with diabetic polyneuropathy: Secondary | ICD-10-CM

## 2019-08-16 NOTE — Chronic Care Management (AMB) (Signed)
  Care Management   Note  08/16/2019 Name: Terry Parks MRN: [id number] DOB: 11-05-1955  Successful telephone outreach to complete post hospital discharge transition of care assessment. Mr Hanko says he is 20 lbs lighter after he was diuresed during his hospitalization of 3/26-3/30 for acute on chronic heart failure. He says he has not started weighing himself but he is aware he is supposed to and knows to notify his provider if he gains 2-3 lbs overnight or 3-4 lbs in 2 days . He says he does have the scales at home this CCM RN provided for him. He states he has all of his discharge medications and is taking them as prescribed. He says he has not checked his blood sugar since hospital discharge.  Encouraged him to check CBG at least once daily.   Telephone follow up appointment with care management team member scheduled for: 08/20/19 at 2:00 pm  [name] RN, CCM, [facility] RN Care Manager [phone]

## 2019-08-17 NOTE — Discharge Summary (Addendum)
Name: Terry Parks MRN: [id number] DOB: 1955/10/10 64 y.o. PCP: [name], MD  Date of Admission: 08/10/2019  8:37 PM Date of Discharge: 08/14/2019 Attending Physician: Lalla Brothers, MD FACP  Discharge Diagnosis: 1. Acute HFrEF exacerbation  Discharge Medications: Allergies as of 08/14/2019      Reactions   Lisinopril Cough      Medication List    TAKE these medications   Accu-Chek Aviva device Use as instructed to check blood sugar up to 3 times a day   Accu-Chek Softclix Lancets lancets Check blood sugar three times a day as instructed   acetaminophen 325 MG tablet Commonly known as: TYLENOL Take 325-650 mg by mouth every 6 (six) hours as needed (for pain).   amiodarone 200 MG tablet Commonly known as: PACERONE Take 1 tablet (200 mg total) by mouth daily.   bisacodyl 5 MG EC tablet Commonly known as: DULCOLAX Take 5 mg by mouth daily as needed for moderate constipation.   carvedilol 25 MG tablet Commonly known as: COREG Take 1 tablet (25 mg total) by mouth 2 (two) times daily with a meal.   cholecalciferol 25 MCG (1000 UNIT) tablet Commonly known as: VITAMIN D3 Take 1 tablet (1,000 Units total) by mouth daily.   DULoxetine 60 MG capsule Commonly known as: Cymbalta Take 1 capsule (60 mg total) by mouth daily.   gabapentin 300 MG capsule Commonly known as: NEURONTIN Take 1 capsule (300 mg total) by mouth 2 (two) times daily.   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   Insulin Pen Needle 31G X 6 MM Misc Commonly known as: CareTouch Pen Needles 1 pen by Does not apply route at bedtime.   isosorbide mononitrate 30 MG 24 hr tablet Commonly known as: IMDUR Take 1 tablet (30 mg total) by mouth daily.   losartan 100 MG  tablet Commonly known as: COZAAR Take 1 tablet (100 mg total) by mouth daily.   metformin 1000 MG (OSM) 24 hr tablet Commonly known as: FORTAMET Take 2 tablets (2,000 mg total) by mouth daily with breakfast. What changed: when to take this   omeprazole 40 MG capsule Commonly known as: PRILOSEC Take 1 capsule (40 mg total) by mouth 2 (two) times daily before a meal.   rivaroxaban 20 MG Tabs tablet Commonly known as: XARELTO Take 1 tablet (20 mg total) by mouth daily with breakfast. What changed: when to take this   spironolactone 50 MG tablet Commonly known as: ALDACTONE Take 1 tablet (50 mg total) by mouth daily.   torsemide 20 MG tablet Commonly known as: DEMADEX Take 2.5 tablets (50 mg total) by mouth 2 (two) times daily. What changed:   how much to take  additional instructions       Disposition and follow-up:   Terry Parks was discharged from [facility] in Good condition.  At the hospital follow up visit please address:  1.  Follow up: 1) CHF - make sure patient is weighing himself daily and is on a good dose of diuretics. Patient would benefit from Cardiac rehabilitation 2) AF - make sure he is taking his Xarelto 3. 3) DM - titrate home medications  2.  Labs / imaging needed at time of follow-up: BMP  3.  Pending labs/ test needing follow-up: none  Follow-up Appointments:  1) [facility] - 04/08  Hospital Course by problem list: 1. Acute HFrEF exacerbation - patient presented to [facility] with signs and symptoms concerning for hypervolemia secondary to his underlying HFrEF. He was roughly 10 kg more than his dry weight with significant LE edema and decreased exercise tolerance 2/2 to [facility]. He was started on IV diuretics with roughly -6 L net during this admission. His kidney function remained stable thorught diuretic course. Patient symptoms resolved and his functional status improved.    Discharge Vitals:   BP 130/77 (BP  Location: Right Arm)   Pulse 65   Temp 97.8 F (36.6 C) (Oral)   Resp 18   Wt (!) 144.3 kg   SpO2 96%   BMI 41.97 kg/m   Pertinent Labs, Studies, and Procedures:  Last Weight  Most recent update: 08/14/2019  4:11 AM   Weight  144.3 kg (318 lb 1.6 oz)             CBC Latest Ref Rng & Units 08/11/2019 08/10/2019 04/23/2019  WBC 4.0 - 10.5 K/uL 6.8 6.5 8.0  Hemoglobin 13.0 - 17.0 g/dL 11.6(L) 12.0(L) 14.2  Hematocrit 39.0 - 52.0 % 36.7(L) 37.7(L) 44.2  Platelets 150 - 400 K/uL 182 200 178   CMP Latest Ref Rng & Units 08/14/2019 08/13/2019 08/12/2019  Glucose 70 - 99 mg/dL 218(H) 113(H) 184(H)  BUN 8 - 23 mg/dL 19 13 11   Creatinine 0.61 - 1.24 mg/dL 1.57(H) 1.58(H) 1.30(H)  Sodium 135 - 145 mmol/L 134(L) 138 136  Potassium 3.5 - 5.1 mmol/L 4.4 3.2(L) 4.0  Chloride 98 - 111 mmol/L 99 99 100  CO2 22 - 32 mmol/L 24 27 27   Calcium 8.9 - 10.3 mg/dL 8.5(L) 8.4(L) 8.3(L)  Total Protein 6.5 - 8.1 g/dL - - -  Total Bilirubin 0.3 - 1.2 mg/dL - - -  Alkaline Phos 38 - 126 U/L - - -  AST 15 - 41 U/L - - -  ALT 0 - 44 U/L - - -     Discharge Instructions: Discharge Instructions    Diet - low sodium heart healthy   Complete by: As directed    Diet - low sodium heart healthy   Complete by: As directed    Discharge instructions   Complete by: As directed    You were hospitalized for exacerbation of your heart failure. Thank you for allowing [place] to be part of your care.   We arranged for you to follow up at: [facility], the clinic will call you to make an appointment within a week.      Please note these changes made to your medications:   Please continue all home medications listed on your discharge paperwork.   Please make sure to take your medications and go to your follow up appointments?  Please call our clinic if you have any questions or concerns, we may be able to help and keep you from a long and expensive emergency room wait. Our clinic and after hours  phone number is [phone], the best time to call is Monday through Friday 9 am to 4 pm but there is always someone available 24/7 if you have an emergency. If you need medication refills please notify your pharmacy one week in advance and they will send [place] a request.   Discharge instructions   Complete by: As directed    You were hospitalized for acute on chronic heart failure. Thank you for allowing [place] to be part  of your care.   We arranged for you to follow up at: [facility] - the clinic will call you to make an appointment.   Please note these changes made to your medications:   Please continue home medications that are listed on your discharge paperwork.   Please make sure to follow up with your out patient doctor and take your torsemide as prescribed.  Please call our clinic if you have any questions or concerns, we may be able to help and keep you from a long and expensive emergency room wait. Our clinic and after hours phone number is [phone], the best time to call is Monday through Friday 9 am to 4 pm but there is always someone available 24/7 if you have an emergency. If you need medication refills please notify your pharmacy one week in advance and they will send [place] a request.   Increase activity slowly   Complete by: As directed    Increase activity slowly   Complete by: As directed       Signed: Marianna Payment, MD 08/17/2019, 5:45 AM   Pager: [phone]

## 2019-08-20 ENCOUNTER — Telehealth: Payer: Medicaid Other

## 2019-08-20 ENCOUNTER — Ambulatory Visit: Payer: Medicaid Other | Admitting: *Deleted

## 2019-08-20 DIAGNOSIS — E1142 Type 2 diabetes mellitus with diabetic polyneuropathy: Secondary | ICD-10-CM

## 2019-08-20 DIAGNOSIS — I5042 Chronic combined systolic (congestive) and diastolic (congestive) heart failure: Secondary | ICD-10-CM

## 2019-08-20 DIAGNOSIS — E1159 Type 2 diabetes mellitus with other circulatory complications: Secondary | ICD-10-CM

## 2019-08-20 NOTE — Progress Notes (Signed)
Internal Medicine Clinic Resident   I have personally reviewed this encounter including the documentation in this note and/or discussed this patient with the care management provider. I will address any urgent items identified by the care management provider and will communicate my actions to the patient's PCP. I have reviewed the patient's CCM visit with my supervising attending.  Vickki Muff, MD 08/20/2019

## 2019-08-20 NOTE — Progress Notes (Signed)
Internal Medicine Clinic Resident   I have personally reviewed this encounter including the documentation in this note and/or discussed this patient with the care management provider. I will address any urgent items identified by the care management provider and will communicate my actions to the patient's PCP. I have reviewed the patient's CCM visit with my supervising attending.  Lavette Yankovich D Ashawna Hanback, DO 08/20/2019    

## 2019-08-20 NOTE — Chronic Care Management (AMB) (Signed)
Care Management   Follow Up Note   08/20/2019 Name: Terry Parks MRN: [id number] DOB: 02/10/1956  Referred by: [name], MD Reason for referral : Care Coordination (HF, DM, HTN)   Terry Parks is a 64 y.o. year old male who is a primary care patient of [name], [name], MD. The care management team was consulted for assistance with care management and care coordination needs.    Review of patient status, including review of consultants reports, relevant laboratory and other test results, and collaboration with appropriate care team members and the patient's provider was performed as part of comprehensive patient evaluation and provision of chronic care management services.    SDOH (Social Determinants of Health) assessments performed: No See Care Plan activities for detailed interventions related to [facility])     Advanced Directives: See Care Plan and Vynca application for related entries.   Goals Addressed            This Visit's Progress     Patient Stated   . " the gabapentin does not help my leg pain, it's unbearable at times and I can't care for myself I hurt so bad' (pt-stated)       [place] (see longitudinal plan of care for additional care plan information)  Current Barriers:  [name] Knowledge Deficits related to pain management- patient reports pain is interfering with his ability to sleep and care for himself. He says he wants to remain in his home (refused offer of investigating ALF/rest home) even though he has self care needs  Nurse Case Manager Clinical Goal(s):  [name] Over the next 30 days, patient will verbalizes or demonstrate relief or control of pain and demonstrate increased ability for self care   Interventions:  . Completed pain assessment . Reviewed pain medications with patient and discussed efficacy . Encouraged patient to address ongoing leg pain issues with provider during 08/23/19 appointment and for consideration of referral to pain  management clinic/chiropractor/etc if increasing gabapentin dosage does not help . Reminded patient about his appointment on 08/23/19 at 10:15 am and the need to call to arrange transportation  Patient Self Care Activities:  . Patient verbalizes understanding of plan to discuss pain with provider . Unable to self administer medications as prescribed . Unable to perform ADLs independently . Unable to perform IADLs independently  Please see past updates related to this goal by clicking on the "Past Updates" button in the selected goal      . "I think I need to see my heart doctor because my legs are more swollen, I'm really tired and I'm more short of breath" (pt-stated)       CARE PLAN ENTRY (see longitudinal plan of care for additional care plan information)   Current Barriers:  [name] Knowledge deficit related to basic heart failure pathophysiology and self care management- pt states he feels much better, says his legs are no longer edematous but still painful, he says he is weighing daily, taking his medications as directed, and checking his blood pressure, pulse and blood sugar every day. He reports his blood pressure and pulse today are 113/54, 66 and fasting CBG= 301. He says he ate pie and cake after dinner last night. He says he needs refills on 4 of his medications.   Case Manager Clinical Goal(s):  [name] Over the next 30 days, patient will weigh self daily and record . Over the next 30 days, patient will verbalize understanding of Heart Failure Action Plan and when to call  doctor . Over the next 30 days, patient will take all Heart Failure mediations as prescribed . Over the next 30 days, patient will weigh daily and record (notifying MD of 3 lb weight gain over night or 5 lb in a week)  Interventions:  . Spoke with patient  to assess medication taking behavior, daily weights and BP and CBG readings . Counseled patient on limiting consumption of concentrated sweets . Notified patient's  primary care provider via In Basket message of need for refills on gabapentin, omeprazole, Xultophy and Xarelto  Patient Self Care Activities:  . Weighs daily and record (notifying MD of 3 lb weight gain over night or 5 lb in a week) . Takes Heart Failure Medications as prescribed . Verbalizes understanding of and follows CHF Action Plan . Adheres to low sodium diet  Please see past updates related to this goal by clicking on the "Past Updates" button in the selected goal          The care management team will reach out to the patient again over the next 7-14 days days.   [name] RN, CCM, [facility] RN Care Manager [phone]

## 2019-08-20 NOTE — Patient Instructions (Signed)
Visit Information It was very nice speaking with you today. I am glad you are feeling better. Please continue to check your weight, blood pressure, and blood sugar daily and record.  Please call today to arrange for transportation to your clinic appointment on 08/23/19 at 10:15 am.  Goals Addressed            This Visit's Progress     Patient Stated   . " the gabapentin does not help my leg pain, it's unbearable at times and I can't care for myself I hurt so bad' (pt-stated)       [place] (see longitudinal plan of care for additional care plan information)  Current Barriers:  [name] Knowledge Deficits related to pain management- patient reports pain is interfering with his ability to sleep and care for himself. He says he wants to remain in his home (refused offer of investigating ALF/rest home) even though he has self care needs  Nurse Case Manager Clinical Goal(s):  [name] Over the next 30 days, patient will verbalizes or demonstrate relief or control of pain and demonstrate increased ability for self care   Interventions:  . Completed pain assessment . Reviewed pain medications with patient and discussed efficacy . Encouraged patient to address ongoing pain issues with provider during 08/23/19 appointment and for consideration of referral to pain management clinic/chiropractor/etc if increasing gabapentin dosage does not help . Reminded patient about his appointment on 08/23/19 at 10:15 am and the need for him to call for transportation  Patient Self Care Activities:  . Patient verbalizes understanding of plan to discuss pain with provider . Unable to self administer medications as prescribed . Unable to perform ADLs independently . Unable to perform IADLs independently  Please see past updates related to this goal by clicking on the "Past Updates" button in the selected goal      . "I think I need to see my heart doctor because my legs are more swollen, I'm really tired and I'm more  short of breath" (pt-stated)       CARE PLAN ENTRY (see longitudinal plan of care for additional care plan information)   Current Barriers:  [name] Knowledge deficit related to basic heart failure pathophysiology and self care management- pt states he feels much better, says his legs are no longer edematous but still painful, he says he is weighing daily, taking his medications as directed, and checking his blood pressure, pulse and blood sugar every day. He reports his blood pressure and pulse today are 113/54, 66 and fasting CBG= 301. He says he ate pie and cake after dinner last night. He says he needs refills on 4 of his medications.   Case Manager Clinical Goal(s):  [name] Over the next 30 days, patient will weigh self daily and record . Over the next 30 days, patient will verbalize understanding of Heart Failure Action Plan and when to call doctor . Over the next 30 days, patient will take all Heart Failure mediations as prescribed . Over the next 30 days, patient will weigh daily and record (notifying MD of 3 lb weight gain over night or 5 lb in a week)  Interventions:  . Spoke with patient  to assess medication taking behavior, daily weights and BP and CBG readings . Counseled patient on limiting consumption of concentrated sweets . Notified patient's primary care provider of need for refills on gabapentin, omeprazole, Xultophy and Xarelto  Patient Self Care Activities:  . Weighs daily and record (notifying MD  of 3 lb weight gain over night or 5 lb in a week) . Takes Heart Failure Medications as prescribed . Verbalizes understanding of and follows CHF Action Plan . Adheres to low sodium diet  Please see past updates related to this goal by clicking on the "Past Updates" button in the selected goal         The patient verbalized understanding of instructions provided today and declined a print copy of patient instruction materials.   The care management team will reach out to the patient  again over the next 7-14  days.   [name] RN, CCM, [facility] RN Care Manager [phone]

## 2019-08-21 NOTE — Progress Notes (Signed)
Internal Medicine Clinic Attending  CCM services provided by the care management provider and their documentation were discussed with Dr. Bloomfield. We reviewed the pertinent findings, urgent action items addressed by the resident and non-urgent items to be addressed by the PCP.  I agree with the assessment, diagnosis, and plan of care documented in the CCM and resident's note.  Haille Pardi, MD 08/21/2019  

## 2019-08-23 ENCOUNTER — Other Ambulatory Visit: Payer: Self-pay | Admitting: Internal Medicine

## 2019-08-23 ENCOUNTER — Other Ambulatory Visit: Payer: Self-pay

## 2019-08-23 ENCOUNTER — Ambulatory Visit (INDEPENDENT_AMBULATORY_CARE_PROVIDER_SITE_OTHER): Payer: Medicaid Other | Admitting: Internal Medicine

## 2019-08-23 ENCOUNTER — Ambulatory Visit: Payer: Medicaid Other

## 2019-08-23 VITALS — BP 169/99 | HR 86 | Temp 97.8°F | Ht 73.0 in | Wt 320.7 lb

## 2019-08-23 DIAGNOSIS — E1142 Type 2 diabetes mellitus with diabetic polyneuropathy: Secondary | ICD-10-CM | POA: Diagnosis not present

## 2019-08-23 DIAGNOSIS — Z794 Long term (current) use of insulin: Secondary | ICD-10-CM | POA: Diagnosis not present

## 2019-08-23 DIAGNOSIS — Z8719 Personal history of other diseases of the digestive system: Secondary | ICD-10-CM

## 2019-08-23 DIAGNOSIS — I5023 Acute on chronic systolic (congestive) heart failure: Secondary | ICD-10-CM

## 2019-08-23 DIAGNOSIS — E785 Hyperlipidemia, unspecified: Secondary | ICD-10-CM

## 2019-08-23 DIAGNOSIS — I5042 Chronic combined systolic (congestive) and diastolic (congestive) heart failure: Secondary | ICD-10-CM | POA: Diagnosis not present

## 2019-08-23 DIAGNOSIS — E1159 Type 2 diabetes mellitus with other circulatory complications: Secondary | ICD-10-CM

## 2019-08-23 DIAGNOSIS — Z79899 Other long term (current) drug therapy: Secondary | ICD-10-CM

## 2019-08-23 DIAGNOSIS — Z7901 Long term (current) use of anticoagulants: Secondary | ICD-10-CM | POA: Diagnosis not present

## 2019-08-23 DIAGNOSIS — I152 Hypertension secondary to endocrine disorders: Secondary | ICD-10-CM

## 2019-08-23 DIAGNOSIS — E1169 Type 2 diabetes mellitus with other specified complication: Secondary | ICD-10-CM

## 2019-08-23 DIAGNOSIS — I4819 Other persistent atrial fibrillation: Secondary | ICD-10-CM

## 2019-08-23 MED ORDER — GABAPENTIN 300 MG PO CAPS: 600.0000 mg | ORAL_CAPSULE | Freq: Two times a day (BID) | ORAL | 3 refills | Status: DC

## 2019-08-23 MED ORDER — OMEPRAZOLE 40 MG PO CPDR: 40.0000 mg | DELAYED_RELEASE_CAPSULE | Freq: Two times a day (BID) | ORAL | 3 refills | Status: DC

## 2019-08-23 MED ORDER — XULTOPHY 100-3.6 UNIT-MG/ML ~~LOC~~ SOPN: 50.0000 [IU] | PEN_INJECTOR | Freq: Every day | SUBCUTANEOUS | 11 refills | Status: DC

## 2019-08-23 MED ORDER — RIVAROXABAN 20 MG PO TABS: 20.0000 mg | ORAL_TABLET | Freq: Every day | ORAL | 0 refills | Status: DC

## 2019-08-23 MED FILL — GABAPENTIN 300 MG CAPSULE: 300 | 30 days supply | Qty: 150 | Fill #0

## 2019-08-23 MED FILL — OMEPRAZOLE 40 MG CPDR: 40 | 30 days supply | Qty: 60 | Fill #0

## 2019-08-23 MED FILL — XARELTO 20 MG TABLET: 20 | 90 days supply | Qty: 90 | Fill #0

## 2019-08-23 NOTE — Patient Instructions (Signed)
Visit Information  Goals Addressed            This Visit's Progress   . "I want to look into Assisted Living (pt-stated)       CARE PLAN ENTRY (see longtitudinal plan of care for additional care plan information)  Current Barriers:  . Level of care concerns . ADL IADL limitations  Clinical Social Work Clinical Goal(s):  [name] Over the next 90 days, patient will work with SW to address concerns related to level of care; transition to Assisted Living  Interventions: . Provided patient with information about process for transition to Assisted Living . Talked with patient about eligibility for this level of care as well as [place] . Informed patient that process for transitioning to Assisted Living is typically a lengthy process . Provided patient with list of facilities in [facility] and advised him to research facilities.   . Will collaborate with provider regarding completion of FL 2  Patient Self Care Activities:  . Patient verbalizes understanding of plan to select Assisted Living Facilities in which he would like to apply  . Initial goal documentation     . COMPLETED: "I want to stay home but could use some help" (pt-stated)       CARE PLAN ENTRY (see longitudinal plan of care for additional care plan information)  Current Barriers:  . ADL IADL limitations   Social Work Clinical Goal(s):  [name] Over the next 30 days, patient will work with SW to address concerns related to personal care services.    Interventions: . Collaborated with Dr. Maricela Bo regarding request for [place].  Patient deemed medically unstable due to Diabetes not being managed.  Patient must be deemed medically stable to be assessed for Personal Care Services.   . Attempted to contact patient today to discuss but had to leave message.    Patient Self Care Activities:  . Patient able to perform MOST ADL's independently  . Does not adhere to prescribed medication  regimen . Unable to perform all ADLs independently  Please see past updates related to this goal by clicking on the "Past Updates" button in the selected goal         Patient verbalizes understanding of instructions provided today.   Telephone follow up appointment with care management team member scheduled for:08/31/19    Ronn Melena, [place] Coordination Social Worker [place] [phone]

## 2019-08-23 NOTE — Progress Notes (Signed)
Acute Office Visit  Subjective:    Patient ID: Terry Parks, male    DOB: [birth year], 64 y.o.   MRN: [id number]  Chief Complaint  Patient presents with  . Follow-up    HFU     HPI Patient is in today for hospital follow-up for recent heart failure exacerbation. Please see problem based charting for further details.   Past Medical History:  Diagnosis Date  . Abscessed tooth    top back large cavity no pain or drainage, one on bottom  pt pulled tooth 4-5 months ago, right top large hole in tooth  . AKI (acute kidney injury) ([place])   . Allergic rhinitis   . Anemia   . Anxiety   . Asthma   . Atrial fibrillation ([place])   . BPH (benign prostatic hypertrophy)    Massive BPH noted on cystoscopy 1/23/ 2012 by Dr. Risa Grill.  . Cancer [facility])    prostate cancer 2019  . Cardiomyopathy ([place])   . CHF (congestive heart failure) ([place])   . Cough 03/30/2012  . Depression   . Diabetes mellitus 04/08/2008   type 2  . Foley catheter in place 07-05-17 placed  . Headache(784.0)    hx migraines none recent  . Hyperlipemia   . Hypertension   . Hypertensive cardiopathy 03/01/2006   2-D echocardiogram 02/01/2012 showed moderate LVH, mildly to moderately reduced left ventricular systolic function with an estimated ejection fraction of 40-45%, and diffuse hypokinesis.  A nuclear medicine stress study done 01/31/2012 showed no reversible ischemia, a small mid anterior wall fixed defect/infarct, and ejection fraction 42%.      . Neck pain   . Nephrolithiasis 05/29/2010   CT scan of abdomen/pelvis on 05/29/2010 showed an obstructing approximate 1-2 mm calculus at the left UVJ, and an approximate 1-2 mm left lower pole renal calculus.   Patient had continuing severe pain , and an elevation of his serum creatinine to a value of 1.75 on 06/06/2010.  Patient underwent cystoscopy on 06/08/2010 by Dr. Risa Grill, but attempts at retrograde pyelogram and ureteroscopy were unsucc  . Numbness 01/08/2018  . Obstructive  sleep apnea 03/06/2008   Sleep study 03/06/08 showed severe OSA/hypopnea syndrome, with successful CPAP titration to 13 CWP using a medium ResMed Mirage Quattro full face mask with heated humidifier.   . Rash 04/17/2014  . Renal calculus 05/29/2010   CT scan of abdomen/pelvis on 05/29/2010 showed an obstructing approximate 1-2 mm calculus at the left UVJ, and an approximate 1-2 mm left lower pole renal calculus.   Patient had continuing severe pain , and an elevation of his serum creatinine to a value of 1.75 on 06/06/2010.  The stone had apparently passed and was not seen on repeat CT 06/08/2010.  . Sleep apnea    haven't use cpap in 2 years  . Tooth pain 10/29/2017  . Urinary straining 11/02/2016    Past Surgical History:  Procedure Laterality Date  . big toe nails removed Bilateral 20 yrs ago  . CARDIOVERSION N/A 06/08/2017   Procedure: CARDIOVERSION;  Surgeon: Josue Hector, MD;  Location[facility] ENDOSCOPY;  Service: Cardiovascular;  Laterality: N/A;  . COLONOSCOPY    . CYSTOSCOPY W/ RETROGRADES    . IR RADIOLOGIST EVAL & MGMT  02/02/2018  . LYMPHADENECTOMY Bilateral 10/06/2017   Procedure: LYMPHADENECTOMY;  Surgeon: Lucas Mallow, MD;  Location: [facility] ORS;  Service: Urology;  Laterality: Bilateral;  . ROBOT ASSISTED LAPAROSCOPIC RADICAL PROSTATECTOMY N/A 10/06/2017   Procedure: XI ROBOTIC ASSISTED LAPAROSCOPIC  RADICAL PROSTATECTOMY;  Surgeon: Lucas Mallow, MD;  Location: [facility] ORS;  Service: Urology;  Laterality: N/A;  . TRANSURETHRAL RESECTION OF BLADDER TUMOR N/A 07/13/2017   Procedure: TRANSURETHRAL RESECTION OF PROSTATE;  Surgeon: Lucas Mallow, MD;  Location: [facility] ORS;  Service: Urology;  Laterality: N/A;    Family History  Problem Relation Age of Onset  . Breast cancer Mother   . Hypertension Father   . Diabetes Maternal Grandmother   . Colon cancer Neg Hx   . Prostate cancer Neg Hx   . Heart attack Neg Hx   . Esophageal cancer Neg Hx   . Inflammatory bowel disease Neg Hx     . Liver disease Neg Hx   . Pancreatic cancer Neg Hx   . Rectal cancer Neg Hx   . Stomach cancer Neg Hx     Social History   Socioeconomic History  . Marital status: Single    Spouse name: Not on file  . Number of children: 4  . Years of education: 40  . Highest education level: Not on file  Occupational History  . Occupation:        Employer: UNEMPLOYED  Tobacco Use  . Smoking status: Never Smoker  . Smokeless tobacco: Never Used  Substance and Sexual Activity  . Alcohol use: No    Alcohol/week: 0.0 standard drinks  . Drug use: No  . Sexual activity: Not Currently  Other Topics Concern  . Not on file  Social History Narrative   Divorced, 4 children, lives alone.     Social Determinants of Health   Financial Resource Strain: Low Risk   . Difficulty of Paying Living Expenses: Not hard at all  Food Insecurity: No Food Insecurity  . Worried About Charity fundraiser in the Last Year: Never true  . Ran Out of Food in the Last Year: Never true  Transportation Needs: No Transportation Needs  . Lack of Transportation (Medical): No  . Lack of Transportation (Non-Medical): No  Physical Activity: Inactive  . Days of Exercise per Week: 0 days  . Minutes of Exercise per Session: 0 min  Stress:   . Feeling of Stress :   Social Connections: Unknown  . Frequency of Communication with Friends and Family: More than three times a week  . Frequency of Social Gatherings with Friends and Family: More than three times a week  . Attends Religious Services: More than 4 times per year  . Active Member of Clubs or Organizations: Not on file  . Attends Archivist Meetings: Not on file  . Marital Status: Not on file  Intimate Partner Violence:   . Fear of Current or Ex-Partner:   . Emotionally Abused:   [name] Physically Abused:   . Sexually Abused:     Outpatient Medications Prior to Visit  Medication Sig Dispense Refill  . Accu-Chek Softclix Lancets lancets Check blood sugar  three times a day as instructed 300 each 3  . acetaminophen (TYLENOL) 325 MG tablet Take 325-650 mg by mouth every 6 (six) hours as needed (for pain).    [name] amiodarone (PACERONE) 200 MG tablet Take 1 tablet (200 mg total) by mouth daily. 90 tablet 1  . bisacodyl (DULCOLAX) 5 MG EC tablet Take 5 mg by mouth daily as needed for moderate constipation.    . Blood Glucose Monitoring Suppl (ACCU-CHEK AVIVA) device Use as instructed to check blood sugar up to 3 times a day 1 each 0  .  carvedilol (COREG) 25 MG tablet Take 1 tablet (25 mg total) by mouth 2 (two) times daily with a meal. 180 tablet 1  . cholecalciferol (VITAMIN D3) 25 MCG (1000 UT) tablet Take 1 tablet (1,000 Units total) by mouth daily. (Patient not taking: Reported on 08/11/2019) 30 tablet 11  . DULoxetine (CYMBALTA) 60 MG capsule Take 1 capsule (60 mg total) by mouth daily. 90 capsule 3  . 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 insulin dependent 300 each 11  . Insulin Pen Needle (CARETOUCH PEN NEEDLES) 31G X 6 MM MISC 1 pen by Does not apply route at bedtime. 100 each 3  . isosorbide mononitrate (IMDUR) 30 MG 24 hr tablet Take 1 tablet (30 mg total) by mouth daily. 90 tablet 3  . losartan (COZAAR) 100 MG tablet Take 1 tablet (100 mg total) by mouth daily. 90 tablet 0  . metformin (FORTAMET) 1000 MG (OSM) 24 hr tablet Take 2 tablets (2,000 mg total) by mouth daily with breakfast. (Patient taking differently: Take 2,000 mg by mouth 2 (two) times daily with a meal. ) 180 tablet 1  . spironolactone (ALDACTONE) 50 MG tablet Take 1 tablet (50 mg total) by mouth daily. 90 tablet 1  . torsemide (DEMADEX) 20 MG tablet Take 2.5 tablets (50 mg total) by mouth 2 (two) times daily. (Patient taking differently: Take 60 mg by mouth 2 (two) times daily. Take med before 5p) 450 tablet 1  . gabapentin (NEURONTIN) 300 MG capsule Take 1 capsule (300 mg total) by mouth 2 (two) times daily. 60 capsule 3    . Insulin Degludec-Liraglutide (XULTOPHY) 100-3.6 UNIT-MG/ML SOPN Inject 50 Units into the skin daily. 5 pen 11  . omeprazole (PRILOSEC) 40 MG capsule Take 1 capsule (40 mg total) by mouth 2 (two) times daily before a meal. 60 capsule 3  . rivaroxaban (XARELTO) 20 MG TABS tablet Take 1 tablet (20 mg total) by mouth daily with breakfast. (Patient taking differently: Take 20 mg by mouth daily with supper. ) 90 tablet 0   No facility-administered medications prior to visit.    Allergies  Allergen Reactions  . Lisinopril Cough    Review of Systems  Constitutional: Negative for chills and fever.  Respiratory: Negative for cough, shortness of breath and wheezing.   Cardiovascular: Negative for chest pain and palpitations.  Gastrointestinal: Negative for abdominal distention.       Objective:    Physical Exam Constitutional:      General: He is not in acute distress.    Appearance: Normal appearance.  Cardiovascular:     Rate and Rhythm: Normal rate and regular rhythm.  Pulmonary:     Effort: Pulmonary effort is normal.     Breath sounds: Normal breath sounds.  Abdominal:     General: Bowel sounds are normal. There is no distension.     Palpations: Abdomen is soft.     Tenderness: There is no abdominal tenderness.  Musculoskeletal:     Right lower leg: Edema present.     Left lower leg: Edema present.  Skin:    General: Skin is warm and dry.  Neurological:     General: No focal deficit present.     Mental Status: He is alert and oriented to person, place, and time.  Psychiatric:        Mood and Affect: Mood normal.        Behavior: Behavior normal.     BP [name])  169/99 (BP Location: Left Arm, Patient Position: Sitting)   Pulse 86   Temp 97.8 F (36.6 C) (Oral)   Ht 6\' 1"  (1.854 m)   Wt (!) 320 lb 11.2 oz (145.5 kg)   SpO2 100% Comment: room air  BMI 42.31 kg/m  Wt Readings from Last 3 Encounters:  08/23/19 (!) 320 lb 11.2 oz (145.5 kg)  08/14/19 (!) 318 lb 1.6 oz  (144.3 kg)  08/10/19 (!) 330 lb 3.2 oz (149.8 kg)    Health Maintenance Due  Topic Date Due  . OPHTHALMOLOGY EXAM  10/29/2018  . LIPID PANEL  01/10/2019    There are no preventive care reminders to display for this patient.   Lab Results  Component Value Date   TSH 2.410 12/26/2018   Lab Results  Component Value Date   WBC 6.8 08/11/2019   HGB 11.6 (L) 08/11/2019   HCT 36.7 (L) 08/11/2019   MCV 88.2 08/11/2019   PLT 182 08/11/2019   Lab Results  Component Value Date   NA 139 08/23/2019   K 4.2 08/23/2019   CO2 21 08/23/2019   GLUCOSE 103 (H) 08/23/2019   BUN 7 (L) 08/23/2019   CREATININE 1.33 (H) 08/23/2019   BILITOT 0.8 04/23/2019   ALKPHOS 73 04/23/2019   AST 23 04/23/2019   ALT 24 04/23/2019   PROT 8.0 04/23/2019   ALBUMIN 3.9 04/23/2019   CALCIUM 8.9 08/23/2019   ANIONGAP 11 08/14/2019   Lab Results  Component Value Date   CHOL 101 01/09/2018   Lab Results  Component Value Date   HDL 26 (L) 01/09/2018   Lab Results  Component Value Date   LDLCALC 64 01/09/2018   Lab Results  Component Value Date   TRIG 57 01/09/2018   Lab Results  Component Value Date   CHOLHDL 3.9 01/09/2018   Lab Results  Component Value Date   HGBA1C 12.7 (A) 07/12/2019       Assessment & Plan:   Problem List Items Addressed This Visit      Cardiovascular and Mediastinum   Chronic combined systolic and diastolic CHF (congestive heart failure) (HCC) - Primary (Chronic)   Relevant Medications   rivaroxaban (XARELTO) 20 MG TABS tablet   Other Relevant Orders   BMP8+Anion Gap (Completed)   Persistent atrial fibrillation (Chronic)   Relevant Medications   rivaroxaban (XARELTO) 20 MG TABS tablet   Acute on chronic HFrEF (heart failure with reduced ejection fraction) ([place])    Patient presents for hospital follow-up after recent admission for heart failure exacerbation. During hospitalization he diuresed 6L which equalled approximately 20 lbs. Weight at discharge was  3/30 was 318 lbs. Weight here in clinic today is 320 lbs. Mr. Frelich has been compliant with all of his heart failure medications, but has not started incorporating daily weights into his regimen yet. Marcie Bal with CCM has been working with him and encouraging him to do so. He assures me he will start doing this. He denies any symptoms of shortness of breath with exertion, PND, chest pain or worsening edema.  BMP today shows stable renal function and electrolytes.  Will continue current management and appreciate close following with CCM. He will see PCP in about 6 weeks.       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 Danielsen's neuropathic pain in his legs is the most bothersome symptom for him at this time. He has not noticed any benefit from Gabapentin 300 mg BID. Will increase to 600 mg in the morning and 600 mg qhs with instruction to add an additional dose if he is still having pain.       Relevant Medications   gabapentin (NEURONTIN) 300 MG capsule   Insulin Degludec-Liraglutide (XULTOPHY) 100-3.6 UNIT-MG/ML SOPN    Other Visit Diagnoses    Hx of esophagitis       Relevant Medications   omeprazole (PRILOSEC) 40 MG capsule       Meds ordered this encounter  Medications  . gabapentin (NEURONTIN) 300 MG capsule    Sig: Take 2 capsules (600 mg total) by mouth 2 (two) times daily. Take an additional third pill at night if pain is still uncontrolled.    Dispense:  150 capsule    Refill:  3  . Insulin Degludec-Liraglutide (XULTOPHY) 100-3.6 UNIT-MG/ML SOPN    Sig: Inject 50 Units into the skin daily.    Dispense:  5 pen    Refill:  11    PHARMACIST pls note therapy change, discontinue Lantus and Victoza.  Pls reiterate with patient. Thank you.  [name] omeprazole (PRILOSEC) 40 MG capsule    Sig: Take 1 capsule (40 mg total) by mouth 2 (two) times daily before a meal.    Dispense:  60 capsule    Refill:  3  . rivaroxaban (XARELTO) 20 MG TABS tablet    Sig: Take 1 tablet (20 mg total) by mouth daily with breakfast.    Dispense:  90 tablet    Refill:  0     Lysa Livengood D Malaky Tetrault, DO

## 2019-08-23 NOTE — Chronic Care Management (AMB) (Signed)
  Care Management   Follow Up Note   08/23/2019 Name: Terry Parks MRN: [id number] DOB: [birth year]  Referred by: [name], MD Reason for referral : Care Coordination (Level of care)   Terry Parks is a 64 y.o. year old male who is a primary care patient of [name], [name], MD. The care management team was consulted for assistance with care management and care coordination needs.    Review of patient status, including review of consultants reports, relevant laboratory and other test results, and collaboration with appropriate care team members and the patient's provider was performed as part of comprehensive patient evaluation and provision of chronic care management services.    SDOH (Social Determinants of Health) assessments performed: No See Care Plan activities for detailed interventions related to [facility])     Advanced Directives: See Care Plan and [place]yn[place] application for related entries.   Goals Addressed            This Visit's Progress   . "I want to look into Assisted Living (pt-stated)       CARE PLAN ENTRY (see longtitudinal plan of care for additional care plan information)  Current Barriers:  . Level of care concerns . ADL IADL limitations  Clinical Social Work Clinical Goal(s):  [name] Over the next 90 days, patient will work with SW to address concerns related to level of care; transition to Assisted Living  Interventions: . Provided patient with information about process for transition to Assisted Living . Talked with patient about eligibility for this level of care as well as [place] . Informed patient that process for transitioning to Assisted Living is typically a lengthy process . Provided patient with list of facilities in [facility] and advised him to research facilities.   . Will collaborate with provider regarding completion of FL 2  Patient Self Care Activities:  . Patient verbalizes understanding of plan to select Assisted  Living Facilities in which he would like to apply  . Initial goal documentation     . COMPLETED: "I want to stay home but could use some help" (pt-stated)       CARE PLAN ENTRY (see longitudinal plan of care for additional care plan information)  Current Barriers:  . ADL IADL limitations   Social Work Clinical Goal(s):  [name] Over the next 30 days, patient will work with SW to address concerns related to personal care services.    Interventions: . Collaborated with Dr. Maricela Bo regarding request for [place].  Patient deemed medically unstable due to Diabetes not being managed.  Patient must be deemed medically stable to be assessed for Personal Care Services.   . Attempted to contact patient today to discuss but had to leave message.    Patient Self Care Activities:  . Patient able to perform MOST ADL's independently  . Does not adhere to prescribed medication regimen . Unable to perform all ADLs independently  Please see past updates related to this goal by clicking on the "Past Updates" button in the selected goal          Telephone follow up appointment with care management team member scheduled for:08/31/19    Ronn Melena, [place] Worker [place] [phone]

## 2019-08-23 NOTE — Patient Instructions (Addendum)
[name], It was nice meeting you!  I'm glad you are feeling better since your hospitalization.   Today we discussed:  Your heart failure - keep up the good work with taking all of your medications. Remember to start using your scale every day to keep track of your weights.   Your leg pain - increase the Gabapentin to 2 pills twice daily, with a third pill at night if needed.   I have sent in the refills  You mentioned you needed to [facility].   I am checking your kidney function today and will let you know if we need to make any changes.   Please feel free to call with any questions or concerns.   I'll have you follow-up with Dr. Maricela Bo in about 6 weeks to see how things are going.   Take care!

## 2019-08-23 NOTE — Progress Notes (Signed)
Internal Medicine Clinic Resident   I have personally reviewed this encounter including the documentation in this note and/or discussed this patient with the care management provider. I will address any urgent items identified by the care management provider and will communicate my actions to the patient's PCP. I have reviewed the patient's CCM visit with my supervising attending.  Herb Beltre D Keon Benscoter, DO 08/23/2019    

## 2019-08-24 ENCOUNTER — Encounter: Payer: Self-pay | Admitting: Internal Medicine

## 2019-08-24 DIAGNOSIS — Z23 Encounter for immunization: Secondary | ICD-10-CM | POA: Diagnosis not present

## 2019-08-24 LAB — BMP8+ANION GAP
Anion Gap: 15 mmol/L (ref 10.0–18.0)
BUN/Creatinine Ratio: 5 — ABNORMAL LOW (ref 10–24)
BUN: 7 mg/dL — ABNORMAL LOW (ref 8–27)
CO2: 21 mmol/L (ref 20–29)
Calcium: 8.9 mg/dL (ref 8.6–10.2)
Chloride: 103 mmol/L (ref 96–106)
Creatinine, Ser: 1.33 mg/dL — ABNORMAL HIGH (ref 0.76–1.27)
GFR calc Af Amer: 66 mL/min/{1.73_m2} (ref >59)
GFR calc non Af Amer: 57 mL/min/{1.73_m2} — ABNORMAL LOW (ref >59)
Glucose: 103 mg/dL — ABNORMAL HIGH (ref 65–99)
Potassium: 4.2 mmol/L (ref 3.5–5.2)
Sodium: 139 mmol/L (ref 134–144)

## 2019-08-24 NOTE — Assessment & Plan Note (Signed)
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 He has not noticed any benefit from Gabapentin 300 mg BID. Will increase to 600 mg in the morning and 600 mg qhs with instruction to add an additional dose if he is still having pain.

## 2019-08-24 NOTE — Assessment & Plan Note (Signed)
Patient presents for hospital follow-up after recent admission for heart failure exacerbation. During hospitalization he diuresed 6L which equalled approximately 20 lbs. Weight at discharge was 3/30 was 318 lbs. Weight here in clinic today is 320 lbs. Terry Parks has been compliant with all of his heart failure medications, but has not started incorporating daily weights into his regimen yet. Marcie Bal with CCM has been working with him and encouraging him to do so. He assures me he will start doing this. He denies any symptoms of shortness of breath with exertion, PND, chest pain or worsening edema.  BMP today shows stable renal function and electrolytes.  Will continue current management and appreciate close following with CCM. He will see PCP in about 6 weeks.

## 2019-08-27 NOTE — Progress Notes (Deleted)
Cardiology Office Note:    Date:  08/27/2019   ID:  Terry Parks, DOB December 17, 1955, MRN [id number]  PCP:  [name], MD  Cardiologist:  [name], MD   Referring MD: [name], MD   No chief complaint on file. ***  History of Present Illness:    Terry Parks is a 64 y.o. male with a hx of hypertension, DM, persistent atrial fibrillation, chronic systolic and diastolic heart failure, OSA, HLD, morbid obesity, and CKD stage III.  Myoview in 2013 showed fixed atnerior defect. Echo Jan 2019 with EF 30-35%. Etiology of CHF is not entirely clear, question hypertensive heart disease. Most recent echo 02/2019 with EF 35-40%, moderate LVH, mild dilatation of left and right atrium.  He was recently seen by [facility] 08/10/19 in clinic and found to be hypervolemic with suspected CHF exacerbation.  Labs reviewed from 07/12/2019 showed hyponatremia with a sodium of 129 and a creatinine of 2.01, which was an increase from his baseline creatinine of 1.3-1.7.  He was sent directly to the ER for admission.  Underwent IV diuresis and correction of electrolyte abnormalities.  Discharge weight was 318 pounds after diuresing almost 7 L.  He was continued on Coreg 25 mg twice daily, 100 mg losartan, 50 mg spironolactone, 30 mg of Imdur.  He was discharged on a month daily.  He presents today for follow-up.     Chronic systolic and diastolic heart failure -50 mg torsemide -Continue carvedilol, Imdur, losartan, and spironolactone -We will check BMP today   PAF -Maintained on 200 mg amiodarone and anticoagulated with Xarelto -Was in normal sinus rhythm during his recent hospitalization   Hypertension   Hyperlipidemia   CKD stage III -Discharge serum creatinine was 1.57 with potassium of 4.4. -BMP on 08/23/2019 with serum creatinine of 1.33 and potassium of 4.2, normal sodium      Past Medical History:  Diagnosis Date  . Abscessed tooth    top back large cavity no pain or  drainage, one on bottom  pt pulled tooth 4-5 months ago, right top large hole in tooth  . AKI (acute kidney injury) ([place])   . Allergic rhinitis   . Anemia   . Anxiety   . Asthma   . Atrial fibrillation ([place])   . BPH (benign prostatic hypertrophy)    Massive BPH noted on cystoscopy 1/23/ 2012 by Dr. Risa Grill.  . Cancer [facility])    prostate cancer 2019  . Cardiomyopathy ([place])   . CHF (congestive heart failure) ([place])   . Cough 03/30/2012  . Depression   . Diabetes mellitus 04/08/2008   type 2  . Foley catheter in place 07-05-17 placed  . Headache(784.0)    hx migraines none recent  . Hyperlipemia   . Hypertension   . Hypertensive cardiopathy 03/01/2006   2-D echocardiogram 02/01/2012 showed moderate LVH, mildly to moderately reduced left ventricular systolic function with an estimated ejection fraction of 40-45%, and diffuse hypokinesis.  A nuclear medicine stress study done 01/31/2012 showed no reversible ischemia, a small mid anterior wall fixed defect/infarct, and ejection fraction 42%.      . Neck pain   . Nephrolithiasis 05/29/2010   CT scan of abdomen/pelvis on 05/29/2010 showed an obstructing approximate 1-2 mm calculus at the left UVJ, and an approximate 1-2 mm left lower pole renal calculus.   Patient had continuing severe pain , and an elevation of his serum creatinine to a value of 1.75 on 06/06/2010.  Patient underwent cystoscopy on 06/08/2010 by  Dr. Risa Grill, but attempts at retrograde pyelogram and ureteroscopy were unsucc  . Numbness 01/08/2018  . Obstructive sleep apnea 03/06/2008   Sleep study 03/06/08 showed severe OSA/hypopnea syndrome, with successful CPAP titration to 13 CWP using a medium ResMed Mirage Quattro full face mask with heated humidifier.   . Rash 04/17/2014  . Renal calculus 05/29/2010   CT scan of abdomen/pelvis on 05/29/2010 showed an obstructing approximate 1-2 mm calculus at the left UVJ, and an approximate 1-2 mm left lower pole renal calculus.   Patient had  continuing severe pain , and an elevation of his serum creatinine to a value of 1.75 on 06/06/2010.  The stone had apparently passed and was not seen on repeat CT 06/08/2010.  . Sleep apnea    haven't use cpap in 2 years  . Tooth pain 10/29/2017  . Urinary straining 11/02/2016    Past Surgical History:  Procedure Laterality Date  . big toe nails removed Bilateral 20 yrs ago  . CARDIOVERSION N/A 06/08/2017   Procedure: CARDIOVERSION;  Surgeon: Josue Hector, MD;  Location: [facility] ENDOSCOPY;  Service: Cardiovascular;  Laterality: N/A;  . COLONOSCOPY    . CYSTOSCOPY W/ RETROGRADES    . IR RADIOLOGIST EVAL & MGMT  02/02/2018  . LYMPHADENECTOMY Bilateral 10/06/2017   Procedure: LYMPHADENECTOMY;  Surgeon: Lucas Mallow, MD;  Location: [facility] ORS;  Service: Urology;  Laterality: Bilateral;  . ROBOT ASSISTED LAPAROSCOPIC RADICAL PROSTATECTOMY N/A 10/06/2017   Procedure: XI ROBOTIC ASSISTED LAPAROSCOPIC RADICAL PROSTATECTOMY;  Surgeon: Lucas Mallow, MD;  Location: [facility] ORS;  Service: Urology;  Laterality: N/A;  . TRANSURETHRAL RESECTION OF BLADDER TUMOR N/A 07/13/2017   Procedure: TRANSURETHRAL RESECTION OF PROSTATE;  Surgeon: Lucas Mallow, MD;  Location: [facility] ORS;  Service: Urology;  Laterality: N/A;    Current Medications: No outpatient medications have been marked as taking for the 08/28/19 encounter (Appointment) with Ledora Bottcher, [place].     Allergies:   Lisinopril   Social History   Socioeconomic History  . Marital status: Single    Spouse name: Not on file  . Number of children: 4  . Years of education: 72  . Highest education level: Not on file  Occupational History  . Occupation:        Employer: UNEMPLOYED  Tobacco Use  . Smoking status: Never Smoker  . Smokeless tobacco: Never Used  Substance and Sexual Activity  . Alcohol use: No    Alcohol/week: 0.0 standard drinks  . Drug use: No  . Sexual activity: Not Currently  Other Topics Concern  . Not on file  Social  History Narrative   Divorced, 4 children, lives alone.     Social Determinants of Health   Financial Resource Strain: Low Risk   . Difficulty of Paying Living Expenses: Not hard at all  Food Insecurity: No Food Insecurity  . Worried About Charity fundraiser in the Last Year: Never true  . Ran Out of Food in the Last Year: Never true  Transportation Needs: No Transportation Needs  . Lack of Transportation (Medical): No  . Lack of Transportation (Non-Medical): No  Physical Activity: Inactive  . Days of Exercise per Week: 0 days  . Minutes of Exercise per Session: 0 min  Stress:   . Feeling of Stress :   Social Connections: Unknown  . Frequency of Communication with Friends and Family: More than three times a week  . Frequency of Social Gatherings with Friends and Family: More  than three times a week  . Attends Religious Services: More than 4 times per year  . Active Member of Clubs or Organizations: Not on file  . Attends Archivist Meetings: Not on file  . Marital Status: Not on file     Family History: The patient's ***family history includes Breast cancer in his mother; Diabetes in his maternal grandmother; Hypertension in his father. There is no history of Colon cancer, Prostate cancer, Heart attack, Esophageal cancer, Inflammatory bowel disease, Liver disease, Pancreatic cancer, Rectal cancer, or Stomach cancer.  ROS:   Please see the history of present illness.    *** All other systems reviewed and are negative.  EKGs/Labs/Other Studies Reviewed:    The following studies were reviewed today: ***  EKG:  EKG is *** ordered today.  The ekg ordered today demonstrates ***  Recent Labs: 12/26/2018: TSH 2.410 04/23/2019: ALT 24 08/10/2019: B Natriuretic Peptide 220.7 08/11/2019: Hemoglobin 11.6; Platelets 182 08/13/2019: Magnesium 2.3 08/23/2019: BUN 7; Creatinine, Ser 1.33; Potassium 4.2; Sodium 139  Recent Lipid Panel    Component Value Date/Time   CHOL 101  01/09/2018 0509   CHOL 130 08/22/2017 1151   TRIG 57 01/09/2018 0509   HDL 26 (L) 01/09/2018 0509   HDL 39 (L) 08/22/2017 1151   CHOLHDL 3.9 01/09/2018 0509   VLDL 11 01/09/2018 0509   LDLCALC 64 01/09/2018 0509   LDLCALC 75 08/22/2017 1151    Physical Exam:    VS:  There were no vitals taken for this visit.    Wt Readings from Last 3 Encounters:  08/23/19 (!) 320 lb 11.2 oz (145.5 kg)  08/14/19 (!) 318 lb 1.6 oz (144.3 kg)  08/10/19 (!) 330 lb 3.2 oz (149.8 kg)     GEN: *** Well nourished, well developed in no acute distress HEENT: Normal NECK: No JVD; No carotid bruits LYMPHATICS: No lymphadenopathy CARDIAC: ***RRR, no murmurs, rubs, gallops RESPIRATORY:  Clear to auscultation without rales, wheezing or rhonchi  ABDOMEN: Soft, non-tender, non-distended MUSCULOSKELETAL:  No edema; No deformity  SKIN: Warm and dry NEUROLOGIC:  Alert and oriented x 3 PSYCHIATRIC:  Normal affect   ASSESSMENT:    No diagnosis found. PLAN:    In order of problems listed above:  No diagnosis found.   Medication Adjustments/Labs and Tests Ordered: Current medicines are reviewed at length with the patient today.  Concerns regarding medicines are outlined above.  No orders of the defined types were placed in this encounter.  No orders of the defined types were placed in this encounter.   Signed, Ledora Bottcher, [place]  08/27/2019 3:25 PM    [facility]

## 2019-08-28 ENCOUNTER — Ambulatory Visit: Payer: Medicaid Other | Admitting: Physician Assistant

## 2019-08-29 ENCOUNTER — Ambulatory Visit: Payer: Medicaid Other | Admitting: *Deleted

## 2019-08-29 ENCOUNTER — Telehealth: Payer: Self-pay | Admitting: *Deleted

## 2019-08-29 DIAGNOSIS — E1142 Type 2 diabetes mellitus with diabetic polyneuropathy: Secondary | ICD-10-CM

## 2019-08-29 DIAGNOSIS — I5042 Chronic combined systolic (congestive) and diastolic (congestive) heart failure: Secondary | ICD-10-CM

## 2019-08-29 NOTE — Chronic Care Management (AMB) (Signed)
Care Management   Follow Up Note   08/29/2019 Name: Terry Parks MRN: [id number] DOB: 10/16/1955  Referred by: [name], MD Reason for referral : Care Coordination (HF, DM, LE Neuropathy)   Terry Parks is a 64 y.o. year old male who is a primary care patient of [name], [name], MD. The care management team was consulted for assistance with care management and care coordination needs.    Review of patient status, including review of consultants reports, relevant laboratory and other test results, and collaboration with appropriate care team members and the patient's provider was performed as part of comprehensive patient evaluation and provision of chronic care management services.    SDOH (Social Determinants of Health) assessments performed: No See Care Plan activities for detailed interventions related to [facility])     Advanced Directives: See Care Plan and Vynca application for related entries.    Goals Addressed            This Visit's Progress     Patient Stated    "I'm doing much better with the swelling in my legs but I think I'm depressed and I have a lot of things I want to talk to my doctor about when I see her in May." (pt-stated)       Terry Parks (see longitudinal plan of care for additional care plan information)   Current Barriers:   Knowledge deficit related to basic heart failure pathophysiology and self care management- patient called this CCM RN requesting the following: assist with notifying provider of need for refills on Xultophy and Nystatin, referral to psychiatrist as he says he has very low energy and no motivation to get out of bed many days, referral to surgeon to be evaluated for umbilical hernia repair and referral to eye MD as he says his right eye has not completley healed since he fell and hit it after a syncopal episode on 04/23/19 and assistance with making follow up appointment with cardiologist as he unknowingly missed the  appointment for EKG at cardiologist office on 08/28/19 at 8:45 am He says he is not weighing each morning as directed but he is checking his blood sugar, blood pressure and pulse everyday and recording the values in his phone. He reports the following values of today: BP= 145/90, pulse= 80, fasting blood sugar= 139  Case Manager Clinical Goal(s):   Over the next 30 days, patient will weigh self daily and record  Over the next 30 days, patient will verbalize understanding of Heart Failure Action Plan and when to call doctor  Over the next 30 days, patient will take all Heart Failure mediations as prescribed  Over the next 30 days, patient will weigh daily and record (notifying MD of 3 lb weight gain over night or 5 lb in a week)  Interventions:   Advised patient this CCM RN will make notes of his request of issues to discuss with his provider during his next clinic appointment  Assessed patient's understanding of diabetes and heart failure treatment plan and adherence to plans  Reinforced the importance of daily weights to assess for fluid weight gain and parameters to report to cardiologist   Called cardiologist office and made follow up appointment for 09/13/19 at 11:45 am and left message on patient's cell number  Patient Self Care Activities:   Weighs daily and record (notifying MD of 3 lb weight gain over night or 5 lb in a week)  Takes Heart Failure Medications as prescribed  Verbalizes understanding of  and follows CHF Action Plan  Adheres to low sodium diet  Please see past updates related to this goal by clicking on the "Past Updates" button in the selected goal         The care management team will reach out to the patient again over the next 30 days.   [name] RN, CCM, [place] [facility] RN Care Manager [phone]

## 2019-08-29 NOTE — Patient Instructions (Signed)
Visit Information I'm glad to hear you are feeling better. I have made notes of the things you want to talk with Dr. Maricela Bo about at your next clinic visit. I have sent a message to Dr. Maricela Bo asking her to refill your Xultophy and Nystatin. I made an appointment for you with your cardiologist for 09/13/19 at 61: 58 am.   Goals Addressed            This Visit's Progress     Patient Stated   . "I'm doing much better with the swelling in my legs but I think I'm depressed and I have alot of things I want to talk to my doctor about when I see her in May." (pt-stated)       [place] (see longitudinal plan of care for additional care plan information)   Current Barriers:  [name] Knowledge deficit related to basic heart failure pathophysiology and self care management- patient called this CCM RN requesting the following- assist with notifying provider of need for refills on Xultophy and Nystatin, referral to psychiatrist as he says he has very low energy and no motivation to get out of bed many days, referral to surgeon to be evaluated for umbilical hernia repair and referral to eye MD as he says his right eye has not completley healed since he fell and hit it after a syncopal episode on 04/23/19 and assistance with making follow up appointment with cardiologist as he unknowingly missed the appointment for EKG at cardiologist office on 08/28/19 at 8:45 am He says he is not weighing each morning as directed but he is checking his blood sugar, blood pressure and pulse everyday and recording the values in his phone. He reports the following values of today: BP= 145/90, pulse= 80, fasting blood sugar= 139  Case Manager Clinical Goal(s):  [name] Over the next 30 days, patient will weigh self daily and record . Over the next 30 days, patient will verbalize understanding of Heart Failure Action Plan and when to call doctor . Over the next 30 days, patient will take all Heart Failure mediations as  prescribed . Over the next 30 days, patient will weigh daily and record (notifying MD of 3 lb weight gain over night or 5 lb in a week)  Interventions:  . Advised patient this CCM RN will make notes of his request of issues to discuss with his provider during his next clinic appointment . Assessed patient's understanding of diabetes and heart failure treatment plan and adherence to plans . Reinforced the importance of daily weights to assess for fluid weight gain and parameters to report to cardiologist  . [facility] cardiologist office and made follow up appointment for 09/13/19 at 11:45 am and left message on patient's cell number  Patient Self Care Activities:  . Andrey Cota daily and record (notifying MD of 3 lb weight gain over night or 5 lb in a week) . Takes Heart Failure Medications as prescribed . Verbalizes understanding of and follows CHF Action Plan . Adheres to low sodium diet  Please see past updates related to this goal by clicking on the "Past Updates" button in the selected goal         The patient verbalized understanding of instructions provided today and declined a print copy of patient instruction materials.   The care management team will reach out to the patient again over the next 30 days.   [name] RN, CCM, [place] [facility] RN Care Manager [phone]

## 2019-08-29 NOTE — Telephone Encounter (Signed)
Information was sent to [employer] for PA for [employer].  Approved 4/14/201 thru 08/28/2020.  Conf# [id number]  Sander Nephew, RN 08/29/2019 4:47 PM.

## 2019-08-30 ENCOUNTER — Telehealth: Payer: Medicaid Other

## 2019-08-30 NOTE — Progress Notes (Signed)
Internal Medicine Clinic Attending  CCM services provided by the care management provider and their documentation were discussed with Dr. Bloomfield. We reviewed the pertinent findings, urgent action items addressed by the resident and non-urgent items to be addressed by the PCP.  I agree with the assessment, diagnosis, and plan of care documented in the CCM and resident's note.  Maaliyah Adolph, MD 08/30/2019  

## 2019-08-30 NOTE — Progress Notes (Signed)
Internal Medicine Clinic Resident  I have personally reviewed this encounter including the documentation in this note and/or discussed this patient with the care management provider. I will address any urgent items identified by the care management provider and will communicate my actions to the patient's PCP. I have reviewed the patient's CCM visit with my supervising attending.  Reynol Arnone, MD 08/30/2019    

## 2019-08-31 ENCOUNTER — Ambulatory Visit: Payer: Medicaid Other | Admitting: *Deleted

## 2019-08-31 ENCOUNTER — Ambulatory Visit: Payer: Medicaid Other

## 2019-08-31 ENCOUNTER — Other Ambulatory Visit: Payer: Self-pay | Admitting: Internal Medicine

## 2019-08-31 DIAGNOSIS — E1142 Type 2 diabetes mellitus with diabetic polyneuropathy: Secondary | ICD-10-CM

## 2019-08-31 DIAGNOSIS — I5042 Chronic combined systolic (congestive) and diastolic (congestive) heart failure: Secondary | ICD-10-CM

## 2019-08-31 DIAGNOSIS — I10 Essential (primary) hypertension: Secondary | ICD-10-CM

## 2019-08-31 DIAGNOSIS — I4819 Other persistent atrial fibrillation: Secondary | ICD-10-CM

## 2019-08-31 MED ORDER — METFORMIN HCL ER (OSM) 1000 MG PO TB24: 2000.0000 mg | ORAL_TABLET | Freq: Every day | ORAL | 1 refills | Status: DC

## 2019-08-31 MED ORDER — RIVAROXABAN 20 MG PO TABS: 20.0000 mg | ORAL_TABLET | Freq: Every day | ORAL | 0 refills | Status: DC

## 2019-08-31 MED ORDER — XULTOPHY 100-3.6 UNIT-MG/ML ~~LOC~~ SOPN: 50.0000 [IU] | PEN_INJECTOR | Freq: Every day | SUBCUTANEOUS | 11 refills | Status: DC

## 2019-08-31 MED ORDER — LOSARTAN POTASSIUM 100 MG PO TABS: 100.0000 mg | ORAL_TABLET | Freq: Every day | ORAL | 0 refills | Status: DC

## 2019-08-31 MED ORDER — METFORMIN HCL ER (MOD) 500 MG PO TB24: 2000.0000 mg | ORAL_TABLET | Freq: Every day | ORAL | 1 refills | Status: DC

## 2019-08-31 MED FILL — XULTOPHY 100 UNIT-3.6MG/ML: 100-3.6 | 30 days supply | Qty: 15 | Fill #0

## 2019-08-31 MED FILL — LOSARTAN POTASSIUM 100 MG T: 100 | 90 days supply | Qty: 90 | Fill #0

## 2019-08-31 NOTE — Progress Notes (Signed)
Refilled medication

## 2019-08-31 NOTE — Addendum Note (Signed)
Addended by: [name] on: 08/31/2019 03:33 PM   Modules accepted: Orders

## 2019-08-31 NOTE — Progress Notes (Signed)
Internal Medicine Clinic Attending  CCM services provided by the care management provider and their documentation were discussed with Dr. Ronnald Ramp. We reviewed the pertinent findings, urgent action items addressed by the resident and non-urgent items to be addressed by the PCP.  I agree with the assessment, diagnosis, and plan of care documented in the CCM and resident's note.  Lucious Groves, DO 08/31/2019

## 2019-08-31 NOTE — Chronic Care Management (AMB) (Signed)
  Care Management   Follow Up Note   08/31/2019 Name: Terry Parks MRN: [id number] DOB: 1956-01-27  Referred by: [name], MD Reason for referral : Care Coordination (Level of care)   Terry Parks is a 64 y.o. year old male who is a primary care patient of [name], [name], MD. The care management team was consulted for assistance with care management and care coordination needs.    Review of patient status, including review of consultants reports, relevant laboratory and other test results, and collaboration with appropriate care team members and the patient's provider was performed as part of comprehensive patient evaluation and provision of chronic care management services.    SDOH (Social Determinants of Health) assessments performed: No See Care Plan activities for detailed interventions related to [facility])     Advanced Directives: See Care Plan and Vynca application for related entries.   Goals Addressed            This Visit's Progress   . COMPLETED: "I want to look into Assisted Living (pt-stated)       CARE PLAN ENTRY (see longtitudinal plan of care for additional care plan information)  Current Barriers:  . Level of care concerns . ADL IADL limitations  Clinical Social Work Clinical Goal(s):  [name] Over the next 90 days, patient will work with SW to address concerns related to level of care; transition to Assisted Living  Interventions: . Contacted patient to further discuss request for assistance with transition to ALF . Educated patient about eligibility for Special Assistance and informed him that he must be above the age of 32 or deemed disabled by Social Security Disability to qualify . Talked with patient about Social Security Disability.  Patient states that he has applied and it is being processed.  . Informed patient that private pay is only option for ALF considering that he does not qualify for Special Assistance. Patient decided not to pursue ALF at  this time.    Patient Self Care Activities:  . Patient verbalizes understanding of plan to select Assisted Living Facilities in which he would like to apply  . Please see past updates related to this goal by clicking on the "Past Updates" button in the selected goal      . "I want to try to get personal care services since I can't afford ALF" (pt-stated)       CARE PLAN ENTRY (see longitudinal plan of care for additional care plan information)  Current Barriers:  . ADL IADL limitations   Clinical Social Work Clinical Goal(s):  [name] Over the next 30 days, patient will work with SW to pursue personal care services.   Interventions: . Will collaborate with provider regarding patient's request to be assessed for personal care services.   Patient Self Care Activities:  . Self administers medications as prescribed . Attends all scheduled provider appointments .   Please see past updates related to this goal by clicking on the "Past Updates" button in the selected goal          The patient has been provided with contact information for the care management team and has been advised to call with any health related questions or concerns.    Ronn Melena, [place] Coordination Social Worker [place] [phone]

## 2019-08-31 NOTE — Patient Instructions (Signed)
Visit Information I will let your provider know you need a refill on your Metformin. Amber or I will call you back today with the name of the dentist you were referred to in June of 2019.    Goals Addressed            This Visit's Progress     Patient Stated   . "I need a refill on my Metformin and the name of the dentist I have used in the past as I need some work done on my teeth" (pt-stated)       [place] (see longitudinal plan of care for additional care plan information)  Current Barriers:  [name] Knowledge Deficits related to name of dentist he has used in past and needs refill on Metformin- patient called and says he is out of Metformin and needs the name of the dentist he was referred to back in 2019 as he needs dental work done as soon as possible  [employer] operational officer Clinical Goal(s):  [name] Over the next 7 days, patient will work with chronic care management team to address needs related to medication refill and previous dentist  Interventions:  . Inter-disciplinary care team collaboration (see longitudinal plan of care) . Collaborated with primary care provider regarding need for Metformin refill and name of dentist in referral of 2019 . Requested that BSW provide name and contact number of dentist he was referred to in June 2019 when she makes her follow up call later today (Dr. Thea Gist on [address] in [place] [id number])  . Reminded patient of his appointment with cardiology provider on 09/13/19 at 11:45 am.   Patient Self Care Activities:  . Patient verbalizes understanding of plan to work with chronic care team and provider for medication refills and name of dentist he was referred to in the past . Self administers medications as prescribed . Attends all scheduled provider appointments . Calls pharmacy for medication refills . Unable to independently recall name of dentist he saw in the past and refill Metformin  Initial goal documentation         The patient verbalized understanding of instructions provided today and declined a print copy of patient instruction materials.   The care management team will reach out to the patient later today  [name] RN, CCM, [facility] RN Care Manager [phone]

## 2019-08-31 NOTE — Chronic Care Management (AMB) (Signed)
  Care Management   Follow Up Note   08/31/2019 Name: Terry Parks MRN: [id number] DOB: 1955/07/18  Referred by: [name], MD Reason for referral : Care Coordination (DM, HF)   Terry Parks is a 64 y.o. year old male who is a primary care patient of [name], [name], MD. The care management team was consulted for assistance with care management and care coordination needs.    Review of patient status, including review of consultants reports, relevant laboratory and other test results, and collaboration with appropriate care team members and the patient's provider was performed as part of comprehensive patient evaluation and provision of chronic care management services.    SDOH (Social Determinants of Health) assessments performed: No See Care Plan activities for detailed interventions related to [facility])     Advanced Directives: See Care Plan and Vynca application for related entries.   Goals Addressed            This Visit's Progress     Patient Stated   . "I need a refill on my Metformin and the name of the dentist I have used in the past as I need some work done on my teeth" (pt-stated)       [place] (see longitudinal plan of care for additional care plan information)  Current Barriers:  Terry Parks Knowledge Deficits related to name of dentist he has used in past and needs refill on Metformin- patient called and says he is out of Metformin and needs the name of the dentist he was referred to back in 2019 as he needs dental work done as soon as possible  [employer] operational officer Clinical Goal(s):  Terry Parks Over the next 7 days, patient will work with chronic care management team to address needs related to medication refill and previous dentist  Interventions:  . Inter-disciplinary care team collaboration (see longitudinal plan of care) . Collaborated with primary care provider regarding need for Metformin refill and name of dentist in referral of 2019 ( Sent urgent In Basket  message to Dr. Maricela Bo requesting Metformin refill today.)  . Requested that BSW provide name and contact number of dentist he was referred to in June 2019 when she makes her follow up call later today (Dr. Thea Gist on [address] in [place] [id number])  . Reminded patient of his appointment with cardiology provider on 09/13/19 at 11:45 am.   Patient Self Care Activities:  . Patient verbalizes understanding of plan to work with chronic care team and provider for medication refills and name of dentist he was referred to in the past . Self administers medications as prescribed . Attends all scheduled provider appointments . Calls pharmacy for medication refills . Unable to independently recall name of dentist he saw in the past and refill Metformin  Initial goal documentation         The care management team will reach out to the patient later today.  [name] RN, CCM, [facility] RN Care Manager [phone]

## 2019-08-31 NOTE — Patient Instructions (Addendum)
Visit Information  Goals Addressed            This Visit's Progress   . COMPLETED: "I want to look into Assisted Living (pt-stated)       CARE PLAN ENTRY (see longtitudinal plan of care for additional care plan information)  Current Barriers:  . Level of care concerns . ADL IADL limitations  Clinical Social Work Clinical Goal(s):  [name] Over the next 90 days, patient will work with SW to address concerns related to level of care; transition to Assisted Living  Interventions: . Contacted patient to further discuss request for assistance with transition to ALF . Educated patient about eligibility for Special Assistance and informed him that he must be above the age of 33 or deemed disabled by Social Security Disability to qualify . Talked with patient about Social Security Disability.  Patient states that he has applied and it is being processed.  . Informed patient that private pay is only option for ALF considering that he does not qualify for Special Assistance. Patient decided not to pursue ALF at this time.    Patient Self Care Activities:  . Patient verbalizes understanding of plan to select Assisted Living Facilities in which he would like to apply  . Please see past updates related to this goal by clicking on the "Past Updates" button in the selected goal      . "I want to try to get personal care services since I can't afford ALF" (pt-stated)       CARE PLAN ENTRY (see longitudinal plan of care for additional care plan information)  Current Barriers:  . ADL IADL limitations   Clinical Social Work Clinical Goal(s):  [name] Over the next 30 days, patient will work with SW to pursue personal care services.   Interventions: . Will collaborate with provider regarding patient's request to be assessed for personal care services.   Patient Self Care Activities:  . Self administers medications as prescribed . Attends all scheduled provider appointments .   Please see past  updates related to this goal by clicking on the "Past Updates" button in the selected goal         Patient verbalizes understanding of instructions provided today.   The patient has been provided with contact information for the care management team and has been advised to call with any health related questions or concerns.    Ronn Melena, [place] Coordination Social Worker [place] [phone]

## 2019-09-02 MED ORDER — METFORMIN HCL ER (MOD) 500 MG PO TB24: 2000.0000 mg | ORAL_TABLET | Freq: Every day | ORAL | 1 refills | Status: DC

## 2019-09-02 NOTE — Addendum Note (Signed)
Addended by: [name] on: 09/02/2019 12:46 AM   Modules accepted: Orders

## 2019-09-03 MED ORDER — NYSTATIN 100000 UNIT/GM EX POWD: 1.0000 "application " | Freq: Three times a day (TID) | CUTANEOUS | 0 refills | Status: DC

## 2019-09-03 MED FILL — METFORMIN HCL ER 500 MG TB2: 500 | 30 days supply | Qty: 120 | Fill #0

## 2019-09-03 MED FILL — NYSTATIN 100000 UNIT/GM POW: 100000 | 10 days supply | Qty: 15 | Fill #0

## 2019-09-03 NOTE — Progress Notes (Signed)
Internal Medicine Clinic Resident  I have personally reviewed this encounter including the documentation in this note and/or discussed this patient with the care management provider. I will address any urgent items identified by the care management provider and will communicate my actions to the patient's PCP. I have reviewed the patient's CCM visit with my supervising attending.  Ladona Horns, MD 09/03/2019

## 2019-09-03 NOTE — Addendum Note (Signed)
Addended by: [name] on: 09/03/2019 01:05 PM   Modules accepted: Orders

## 2019-09-04 NOTE — Progress Notes (Signed)
Internal Medicine Clinic Attending  CCM services provided by the care management provider and their documentation were discussed with Dr. Ronnald Ramp. We reviewed the pertinent findings, urgent action items addressed by the resident and non-urgent items to be addressed by the PCP.  I agree with the assessment, diagnosis, and plan of care documented in the CCM and resident's note.  Lucious Groves, DO 09/04/2019

## 2019-09-05 ENCOUNTER — Ambulatory Visit: Payer: Self-pay

## 2019-09-05 DIAGNOSIS — R32 Unspecified urinary incontinence: Secondary | ICD-10-CM | POA: Diagnosis not present

## 2019-09-05 DIAGNOSIS — I5042 Chronic combined systolic (congestive) and diastolic (congestive) heart failure: Secondary | ICD-10-CM

## 2019-09-05 DIAGNOSIS — E1142 Type 2 diabetes mellitus with diabetic polyneuropathy: Secondary | ICD-10-CM

## 2019-09-05 NOTE — Patient Instructions (Signed)
Visit Information  Goals Addressed            This Visit's Progress   . "I want to try to get personal care services since I can't afford ALF" (pt-stated)       CARE PLAN ENTRY (see longitudinal plan of care for additional care plan information)  Current Barriers:  . ADL IADL limitations   Clinical Social Work Clinical Goal(s):  [name] Over the next 30 days, patient will work with SW to pursue personal care services.   Interventions: . Collaborated with Dr. Maricela Bo and Dr. Koleen Distance regarding patient's request to be assessed for [place].  Dr. Koleen Distance will assist with request form since she most recently saw patient post hospital discharge.  . Completed [id number] request form and forwarded to Dr. Koleen Distance for review/sign off.   Patient Self Care Activities:  . Self administers medications as prescribed . Attends all scheduled provider appointments .   Please see past updates related to this goal by clicking on the "Past Updates" button in the selected goal         Will follow up with patient when request form has been processed by [facility] and assessment can be scheduled.      Ronn Melena, [place] Coordination Social Worker [place] [phone]

## 2019-09-05 NOTE — Chronic Care Management (AMB) (Signed)
  Care Management   Follow Up Note   09/05/2019 Name: Terry Parks MRN: [id number] DOB: 1955-08-01  Referred by: [name], MD Reason for referral : Care Coordination (PCS)   Terry Parks is a 64 y.o. year old male who is a primary care patient of [name], [name], MD. The care management team was consulted for assistance with care management and care coordination needs.    Review of patient status, including review of consultants reports, relevant laboratory and other test results, and collaboration with appropriate care team members and the patient's provider was performed as part of comprehensive patient evaluation and provision of chronic care management services.    SDOH (Social Determinants of Health) assessments performed: No See Care Plan activities for detailed interventions related to [facility])     Advanced Directives: See Care Plan and Vynca application for related entries.   Goals Addressed            This Visit's Progress   . "I want to try to get personal care services since I can't afford ALF" (pt-stated)       CARE PLAN ENTRY (see longitudinal plan of care for additional care plan information)  Current Barriers:  . ADL IADL limitations   Clinical Social Work Clinical Goal(s):  [name] Over the next 30 days, patient will work with SW to pursue personal care services.   Interventions: . Collaborated with Dr. Maricela Bo and Dr. Koleen Distance regarding patient's request to be assessed for [place].  Dr. Koleen Distance will assist with request form since she most recently saw patient post hospital discharge.  . Completed [id number] request form and forwarded to Dr. Koleen Distance for review/sign off.   Patient Self Care Activities:  . Self administers medications as prescribed . Attends all scheduled provider appointments .   Please see past updates related to this goal by clicking on the "Past Updates" button in the selected goal          Will follow up  with patient when request form has been processed by [facility] and assessment can be scheduled.      Ronn Melena, [place] Coordination Social Worker [place] [phone]

## 2019-09-05 NOTE — Progress Notes (Signed)
Internal Medicine Clinic Attending  CCM services provided by the care management provider and their documentation were discussed with Dr. Gilford Rile. We reviewed the pertinent findings, urgent action items addressed by the resident and non-urgent items to be addressed by the PCP.  I agree with the assessment, diagnosis, and plan of care documented in the CCM and resident's note.  Lucious Groves, DO 09/05/2019

## 2019-09-05 NOTE — Progress Notes (Signed)
Internal Medicine Clinic Resident   I have personally reviewed this encounter including the documentation in this note and/or discussed this patient with the care management provider. I will address any urgent items identified by the care management provider and will communicate my actions to the patient's PCP. I have reviewed the patient's CCM visit with my supervising attending.  Alasha Mcguinness, MD 09/05/2019   

## 2019-09-06 ENCOUNTER — Ambulatory Visit: Payer: Self-pay

## 2019-09-06 ENCOUNTER — Ambulatory Visit: Payer: Self-pay | Admitting: *Deleted

## 2019-09-06 ENCOUNTER — Telehealth: Payer: Medicaid Other | Admitting: *Deleted

## 2019-09-06 DIAGNOSIS — E1142 Type 2 diabetes mellitus with diabetic polyneuropathy: Secondary | ICD-10-CM

## 2019-09-06 DIAGNOSIS — I5042 Chronic combined systolic (congestive) and diastolic (congestive) heart failure: Secondary | ICD-10-CM

## 2019-09-06 NOTE — Progress Notes (Signed)
Internal Medicine Clinic Resident   I have personally reviewed this encounter including the documentation in this note and/or discussed this patient with the care management provider. I will address any urgent items identified by the care management provider and will communicate my actions to the patient's PCP. I have reviewed the patient's CCM visit with my supervising attending.  Jackeline Gutknecht, MD 09/06/2019   

## 2019-09-06 NOTE — Chronic Care Management (AMB) (Signed)
  Care Management   Follow Up Note   09/06/2019 Name: Oluwadamilola Dombrowski MRN: [id number] DOB: April 13, 1956  Referred by: [name], MD Reason for referral : Care Coordination (DM, HF)   Rush Brust is a 64 y.o. year old male who is a primary care patient of [name], [name], MD. The care management team was consulted for assistance with care management and care coordination needs.    Review of patient status, including review of consultants reports, relevant laboratory and other test results, and collaboration with appropriate care team members and the patient's provider was performed as part of comprehensive patient evaluation and provision of chronic care management services.    SDOH (Social Determinants of Health) assessments performed: No See Care Plan activities for detailed interventions related to [facility])     Advanced Directives: See Care Plan and Vynca application for related entries.   Goals Addressed            This Visit's Progress     Patient Stated   . "I need a refill on my Metformin and the name of the dentist I have used in the past as I need some work done on my teeth" (pt-stated)       [place] (see longitudinal plan of care for additional care plan information)  Current Barriers:  [name] Knowledge Deficits related to name of dentist he has used in past and needs refill on Metformin- patient texted this CCM RNCM and said the number and the name of the dentist that was provided to him was incorrect. He still wants help with securing a dentist that takes Medicaid.   Nurse Case Manager Clinical Goal(s):  [name] Over the next 7 days, patient will work with chronic care management team to address needs related to medication refill and previous dentist  Interventions:  . Inter-disciplinary care team collaboration (see longitudinal plan of care) . Referral placed to community care guide to review community dental resources- original referral for same reason was sent  to care guide on 07/17/19 and resources were provided to patient  Patient Self Care Activities:  . Patient verbalizes understanding of plan to work with chronic care team and provider for medication refills and name of dentist he was referred to in the past . Self administers medications as prescribed . Attends all scheduled provider appointments . Calls pharmacy for medication refills . Unable to independently recall name of dentist he saw in the past and refill Metformin  Please see past updates related to this goal by clicking on the "Past Updates" button in the selected goal          The care management team will reach out to the patient again over the next 14-21 days.   [name] RN, CCM, [facility] RN Care Manager [phone]

## 2019-09-06 NOTE — Progress Notes (Signed)
Internal Medicine Clinic Resident   I have personally reviewed this encounter including the documentation in this note and/or discussed this patient with the care management provider. I will address any urgent items identified by the care management provider and will communicate my actions to the patient's PCP. I have reviewed the patient's CCM visit with my supervising attending.  Jaevin Medearis, MD 09/06/2019   

## 2019-09-06 NOTE — Patient Instructions (Signed)
Visit Information  Goals Addressed            This Visit's Progress   . "I want to try to get personal care services since I can't afford ALF" (pt-stated)       CARE PLAN ENTRY (see longitudinal plan of care for additional care plan information)  Current Barriers:  . ADL IADL limitations   Clinical Social Work Clinical Goal(s):  [name] Over the next 30 days, patient will work with SW to pursue personal care services.   Interventions: . Completed DMA-3051 request for PCS faxed to [facility]   Patient Self Care Activities:  . Self administers medications as prescribed . Attends all scheduled provider appointments .   Please see past updates related to this goal by clicking on the "Past Updates" button in the selected goal         Will follow up with patient when request has been processed and assessment can be scheduled.       Ronn Melena, [place] Coordination Social Worker [place] [phone]

## 2019-09-06 NOTE — Progress Notes (Signed)
Internal Medicine Clinic Attending  Case discussed with Dr. Bloomfield at the time of the visit.  We reviewed the resident's history and exam and pertinent patient test results.  I agree with the assessment, diagnosis, and plan of care documented in the resident's note.  

## 2019-09-06 NOTE — Chronic Care Management (AMB) (Signed)
  Care Management   Follow Up Note   09/06/2019 Name: Terry Parks MRN: [id number] DOB: 16-Jul-1955  Referred by: [name], MD Reason for referral : Care Coordination (PCS)   Terry Parks is a 64 y.o. year old male who is a primary care patient of [name], [name], MD. The care management team was consulted for assistance with care management and care coordination needs.    Review of patient status, including review of consultants reports, relevant laboratory and other test results, and collaboration with appropriate care team members and the patient's provider was performed as part of comprehensive patient evaluation and provision of chronic care management services.    SDOH (Social Determinants of Health) assessments performed: No See Care Plan activities for detailed interventions related to [facility])     Advanced Directives: See Care Plan and Vynca application for related entries.   Goals Addressed            This Visit's Progress   . "I want to try to get personal care services since I can't afford ALF" (pt-stated)       CARE PLAN ENTRY (see longitudinal plan of care for additional care plan information)  Current Barriers:  . ADL IADL limitations   Clinical Social Work Clinical Goal(s):  [name] Over the next 30 days, patient will work with SW to pursue personal care services.   Interventions: . Completed DMA-3051 request for PCS faxed to [facility]   Patient Self Care Activities:  . Self administers medications as prescribed . Attends all scheduled provider appointments .   Please see past updates related to this goal by clicking on the "Past Updates" button in the selected goal          Will follow up with patient when request has been processed and assessment can be scheduled.    Ronn Melena, [place] Coordination Social Worker [place] [phone]

## 2019-09-06 NOTE — Patient Instructions (Addendum)
Visit Information A community care guide will call you again to help you with dental resources.  Goals Addressed            This Visit's Progress     Patient Stated   . "I need a refill on my Metformin and the name of the dentist I have used in the past as I need some work done on my teeth" (pt-stated)       [place] (see longitudinal plan of care for additional care plan information)  Current Barriers:  [name] Knowledge Deficits related to name of dentist he has used in past and needs refill on Metformin- patient texted this CCM RNCM and said the number and the name of the dentist that was provided to him was incorrect. He still wants help with securing a dentist that takes Medicaid.   Nurse Case Manager Clinical Goal(s):  [name] Over the next 7 days, patient will work with chronic care management team to address needs related to medication refill and previous dentist  Interventions:  . Inter-disciplinary care team collaboration (see longitudinal plan of care) . Referral placed to community care guide to review community dental resources- original referral for same reason was sent to care guide on 07/17/19 and resources were provided to patient  Patient Self Care Activities:  . Patient verbalizes understanding of plan to work with chronic care team and provider for medication refills and name of dentist he was referred to in the past . Self administers medications as prescribed . Attends all scheduled provider appointments . Calls pharmacy for medication refills . Unable to independently recall name of dentist he saw in the past and refill Metformin  Please see past updates related to this goal by clicking on the "Past Updates" button in the selected goal         The patient verbalized understanding of instructions provided today and declined a print copy of patient instruction materials.   The care management team will reach out to the patient again over the next 7 days.   [name] RN, CCM, [facility] RN Care Manager [phone]

## 2019-09-10 ENCOUNTER — Ambulatory Visit: Payer: Self-pay

## 2019-09-10 DIAGNOSIS — I5042 Chronic combined systolic (congestive) and diastolic (congestive) heart failure: Secondary | ICD-10-CM

## 2019-09-10 DIAGNOSIS — E1142 Type 2 diabetes mellitus with diabetic polyneuropathy: Secondary | ICD-10-CM

## 2019-09-10 NOTE — Chronic Care Management (AMB) (Signed)
  Care Management   Follow Up Note   09/10/2019 Name: Terry Parks MRN: [id number] DOB: 05-May-1956  Referred by: [name], MD Reason for referral : Care Coordination (PCS)   Terry Parks is a 64 y.o. year old male who is a primary care patient of [name], [name], MD. The care management team was consulted for assistance with care management and care coordination needs.    Review of patient status, including review of consultants reports, relevant laboratory and other test results, and collaboration with appropriate care team members and the patient's provider was performed as part of comprehensive patient evaluation and provision of chronic care management services.    SDOH (Social Determinants of Health) assessments performed: No See Care Plan activities for detailed interventions related to [facility])     Advanced Directives: See Care Plan and Vynca application for related entries.   Goals Addressed            This Visit's Progress   . "I want to try to get personal care services since I can't afford ALF" (pt-stated)       CARE PLAN ENTRY (see longitudinal plan of care for additional care plan information)  Current Barriers:  . ADL IADL limitations   Clinical Social Work Clinical Goal(s):  [name] Over the next 30 days, patient will work with SW to pursue personal care services.   Interventions: . [place] to ensure receipt of request for personal care services. . Attempted to contact patient to inform him that he will be contacted by [facility] for purpose of assessment; left voicemail message  Patient Self Care Activities:  . Self administers medications as prescribed . Attends all scheduled provider appointments .   Please see past updates related to this goal by clicking on the "Past Updates" button in the selected goal          Telephone follow up appointment with care management team member scheduled for:09/12/19    Ronn Melena,  [place] Worker [place] [phone]

## 2019-09-10 NOTE — Patient Instructions (Signed)
Visit Information  Goals Addressed            This Visit's Progress   . "I want to try to get personal care services since I can't afford ALF" (pt-stated)       CARE PLAN ENTRY (see longitudinal plan of care for additional care plan information)  Current Barriers:  . ADL IADL limitations   Clinical Social Work Clinical Goal(s):  [name] Over the next 30 days, patient will work with SW to pursue personal care services.   Interventions: . [place] to ensure receipt of request for personal care services. . Attempted to contact patient to inform him that he will be contacted by [facility] for purpose of assessment; left voicemail message  Patient Self Care Activities:  . Self administers medications as prescribed . Attends all scheduled provider appointments .   Please see past updates related to this goal by clicking on the "Past Updates" button in the selected goal           Telephone follow up appointment with care management team member scheduled for:09/12/19      Ronn Melena, [place] Worker [place] [phone]

## 2019-09-11 NOTE — Progress Notes (Signed)
Internal Medicine Clinic Resident   I have personally reviewed this encounter including the documentation in this note and/or discussed this patient with the care management provider. I will address any urgent items identified by the care management provider and will communicate my actions to the patient's PCP. I have reviewed the patient's CCM visit with my supervising attending.  Brandon Lezly Rumpf, MD 09/11/2019    

## 2019-09-11 NOTE — Progress Notes (Signed)
Internal Medicine Clinic Attending  CCM services provided by the care management provider and their documentation were discussed with Dr. Shan Levans. We reviewed the pertinent findings, urgent action items addressed by the resident and non-urgent items to be addressed by the PCP.  I agree with the assessment, diagnosis, and plan of care documented in the CCM and resident's note.  Larey Dresser, MD 09/11/2019

## 2019-09-12 ENCOUNTER — Ambulatory Visit: Payer: Medicaid Other

## 2019-09-12 DIAGNOSIS — I5042 Chronic combined systolic (congestive) and diastolic (congestive) heart failure: Secondary | ICD-10-CM

## 2019-09-12 DIAGNOSIS — E1142 Type 2 diabetes mellitus with diabetic polyneuropathy: Secondary | ICD-10-CM

## 2019-09-12 NOTE — Progress Notes (Signed)
Internal Medicine Clinic Attending  CCM services provided by the care management provider and their documentation were discussed with Dr. Winters. We reviewed the pertinent findings, urgent action items addressed by the resident and non-urgent items to be addressed by the PCP.  I agree with the assessment, diagnosis, and plan of care documented in the CCM and resident's note.  Hadasa Gasner, MD 09/12/2019  

## 2019-09-12 NOTE — Progress Notes (Signed)
Internal Medicine Clinic Attending  CCM services provided by the care management provider and their documentation were discussed with Dr. Winters. We reviewed the pertinent findings, urgent action items addressed by the resident and non-urgent items to be addressed by the PCP.  I agree with the assessment, diagnosis, and plan of care documented in the CCM and resident's note.  Josejuan Hoaglin, MD 09/12/2019  

## 2019-09-12 NOTE — Progress Notes (Signed)
Internal Medicine Clinic Resident   I have personally reviewed this encounter including the documentation in this note and/or discussed this patient with the care management provider. I will address any urgent items identified by the care management provider and will communicate my actions to the patient's PCP. I have reviewed the patient's CCM visit with my supervising attending.  Brandon Briley Sulton, MD 09/12/2019    

## 2019-09-12 NOTE — Chronic Care Management (AMB) (Signed)
Care Management   Follow Up Note   09/12/2019 Name: Terry Parks MRN: [id number] DOB: [birth year]  Referred by: [name], MD Reason for referral : Care Coordination (PCS)   Terry Parks is a 64 y.o. year old male who is a primary care patient of [name], [name], MD. The care management team was consulted for assistance with care management and care coordination needs.    Review of patient status, including review of consultants reports, relevant laboratory and other test results, and collaboration with appropriate care team members and the patient's provider was performed as part of comprehensive patient evaluation and provision of chronic care management services.    SDOH (Social Determinants of Health) assessments performed: No See Care Plan activities for detailed interventions related to [facility])     Advanced Directives: See Care Plan and Vynca application for related entries.   Goals Addressed            This Visit's Progress   . "I need a refill on my Metformin and the name of the dentist I have used in the past as I need some work done on my teeth" (pt-stated)       [employer] (see longitudinal plan of care for additional care plan information)  Current Barriers:  [name] Knowledge Deficits related to name of dentist he has used in past and needs refill on Metformin- patient texted this CCM RNCM and said the number and the name of the dentist that was provided to him was incorrect. He still wants help with securing a dentist that takes Medicaid.   Nurse Case Manager Clinical Goal(s):  [name] Over the next 7 days, patient will work with chronic care management team to address needs related to medication refill and previous dentist  Interventions:  . Provided patient with contact information for [facility].  [name] Patient Self Care Activities:  . Patient verbalizes understanding of plan to work with chronic care team and provider for medication refills and  name of dentist he was referred to in the past . Self administers medications as prescribed . Attends all scheduled provider appointments . Calls pharmacy for medication refills . Unable to independently recall name of dentist he saw in the past and refill Metformin  Please see past updates related to this goal by clicking on the "Past Updates" button in the selected goal      . "I want to try to get personal care services since I can't afford ALF" (pt-stated)       CARE PLAN ENTRY (see longitudinal plan of care for additional care plan information)  Current Barriers:  . ADL IADL limitations   Clinical Social Work Clinical Goal(s):  [name] Over the next 30 days, patient will work with SW to pursue personal care services.   Interventions: . Called patient to inquire if he has been contacted by [employer] for purpose of scheduling assessment for personal care services.  Patient unsure and stated that he often does not answer calls from numbers that he doesn't recognize.  [name] Provided patient with number for [facility] and encouraged him to call to schedule assessment.    Patient Self Care Activities:  . Self administers medications as prescribed . Attends all scheduled provider appointments .   Please see past updates related to this goal by clicking on the "Past Updates" button in the selected goal          Telephone follow up appointment with care management team member scheduled for:09/21/19  Ronn Melena, [place] Coordination Social Worker [place] [phone]

## 2019-09-12 NOTE — Patient Instructions (Signed)
Visit Information  Goals Addressed            This Visit's Progress   . "I need a refill on my Metformin and the name of the dentist I have used in the past as I need some work done on my teeth" (pt-stated)       [place] (see longitudinal plan of care for additional care plan information)  Current Barriers:  [name] Knowledge Deficits related to name of dentist he has used in past and needs refill on Metformin- patient texted this CCM RNCM and said the number and the name of the dentist that was provided to him was incorrect. He still wants help with securing a dentist that takes Medicaid.   Nurse Case Manager Clinical Goal(s):  [name] Over the next 7 days, patient will work with chronic care management team to address needs related to medication refill and previous dentist  Interventions:  . Provided patient with contact information for [facility].  [name] Patient Self Care Activities:  . Patient verbalizes understanding of plan to work with chronic care team and provider for medication refills and name of dentist he was referred to in the past . Self administers medications as prescribed . Attends all scheduled provider appointments . Calls pharmacy for medication refills . Unable to independently recall name of dentist he saw in the past and refill Metformin  Please see past updates related to this goal by clicking on the "Past Updates" button in the selected goal      . "I want to try to get personal care services since I can't afford ALF" (pt-stated)       CARE PLAN ENTRY (see longitudinal plan of care for additional care plan information)  Current Barriers:  . ADL IADL limitations   Clinical Social Work Clinical Goal(s):  [name] Over the next 30 days, patient will work with SW to pursue personal care services.   Interventions: . Called patient to inquire if he has been contacted by [employer] for purpose of scheduling assessment for personal care services.   Patient unsure and stated that he often does not answer calls from numbers that he doesn't recognize.  [name] Provided patient with number for [facility] and encouraged him to call to schedule assessment.    Patient Self Care Activities:  . Self administers medications as prescribed . Attends all scheduled provider appointments .   Please see past updates related to this goal by clicking on the "Past Updates" button in the selected goal         Patient verbalizes understanding of instructions provided today.   Telephone follow up appointment with care management team member scheduled for:09/21/19    Ronn Melena, [place] Coordination Social Worker [place] [phone]

## 2019-09-13 ENCOUNTER — Encounter: Payer: Self-pay | Admitting: Physician Assistant

## 2019-09-13 ENCOUNTER — Ambulatory Visit: Payer: Medicaid Other | Admitting: Physician Assistant

## 2019-09-13 ENCOUNTER — Other Ambulatory Visit: Payer: Self-pay

## 2019-09-13 VITALS — BP 118/78 | HR 82 | Ht 73.0 in | Wt 322.2 lb

## 2019-09-13 DIAGNOSIS — I480 Paroxysmal atrial fibrillation: Secondary | ICD-10-CM

## 2019-09-13 DIAGNOSIS — E119 Type 2 diabetes mellitus without complications: Secondary | ICD-10-CM

## 2019-09-13 DIAGNOSIS — I5042 Chronic combined systolic (congestive) and diastolic (congestive) heart failure: Secondary | ICD-10-CM | POA: Diagnosis not present

## 2019-09-13 DIAGNOSIS — E785 Hyperlipidemia, unspecified: Secondary | ICD-10-CM

## 2019-09-13 DIAGNOSIS — I10 Essential (primary) hypertension: Secondary | ICD-10-CM | POA: Diagnosis not present

## 2019-09-13 NOTE — Progress Notes (Signed)
Cardiology Office Note:    Date:  09/15/2019   ID:  Terry Parks, DOB 06-May-1956, MRN [id number]  PCP:  [name], MD  Cardiologist:  [name], MD  Electrophysiologist:  None   Referring MD: [name], MD   Chief Complaint  Patient presents with  . Follow-up    seen for Dr. Sallyanne Kuster    History of Present Illness:    Terry Parks is a 64 y.o. male with a hx of combined systolic and diastolic heart failure, PAF on amiodarone, HTN, HLD, DM 2 and OSA.  He has never had a cardiac catheterization in the past.  Myoview obtained in 2013 showed fixed anterior defect.  He has a history of kidney stone in 2019.  Previous echocardiogram in January 2019 showed EF 30 to 35%.  The etiology behind his cardiomyopathy was not entirely clear however the degree of LV dysfunction is related to blood pressure control.  He was last seen by Dr. Sallyanne Kuster on 12/26/2018, spironolactone increased to 40 mg daily, carvedilol increased to 25 mg twice daily. His torsemide was increased from the previous 40 mg daily to 40 mg twice daily in September 2020 by PCP. Last echocardiogram obtained on 02/23/2019 showed EF 35 to 40%, moderate LVH, mild dilatation of left and right atrium.   Over the past 6 months, he had a worsening renal function on repeat lab work.  I last saw the patient on 07/13/2019, he was clearly volume overloaded with abdominal distention.  He has also gained more than 20 pounds since February.  However with worsening renal function, I am concerned of diuresing him as outpatient.  We eventually send the patient to the emergency room.  Patient was admitted to the hospital and was placed on IV diuretic.  By the time he was discharged, he successfully diuresed to roughly 7 L.  Creatinine has improved to 1.5 prior to discharge.  His discharge weight was 144.3 kg.  He was eventually discharged on 50 mg twice daily of torsemide.  He presents today for cardiology office visit.  Since discharge, his  weight at home has been very stable on the current dose of diuretic.  He denies any recent chest pain or shortness of breath.  Overall I think he is doing well on the current dose of torsemide.   Past Medical History:  Diagnosis Date  . Abscessed tooth    top back large cavity no pain or drainage, one on bottom  pt pulled tooth 4-5 months ago, right top large hole in tooth  . AKI (acute kidney injury) ([place])   . Allergic rhinitis   . Anemia   . Anxiety   . Asthma   . Atrial fibrillation ([place])   . BPH (benign prostatic hypertrophy)    Massive BPH noted on cystoscopy 1/23/ 2012 by Dr. Risa Grill.  . Cancer [facility])    prostate cancer 2019  . Cardiomyopathy ([place])   . CHF (congestive heart failure) ([place])   . Cough 03/30/2012  . Depression   . Diabetes mellitus 04/08/2008   type 2  . Foley catheter in place 07-05-17 placed  . Headache(784.0)    hx migraines none recent  . Hyperlipemia   . Hypertension   . Hypertensive cardiopathy 03/01/2006   2-D echocardiogram 02/01/2012 showed moderate LVH, mildly to moderately reduced left ventricular systolic function with an estimated ejection fraction of 40-45%, and diffuse hypokinesis.  A nuclear medicine stress study done 01/31/2012 showed no reversible ischemia, a small mid anterior wall fixed defect/infarct,  and ejection fraction 42%.      . Neck pain   . Nephrolithiasis 05/29/2010   CT scan of abdomen/pelvis on 05/29/2010 showed an obstructing approximate 1-2 mm calculus at the left UVJ, and an approximate 1-2 mm left lower pole renal calculus.   Patient had continuing severe pain , and an elevation of his serum creatinine to a value of 1.75 on 06/06/2010.  Patient underwent cystoscopy on 06/08/2010 by Dr. Risa Grill, but attempts at retrograde pyelogram and ureteroscopy were unsucc  . Numbness 01/08/2018  . Obstructive sleep apnea 03/06/2008   Sleep study 03/06/08 showed severe OSA/hypopnea syndrome, with successful CPAP titration to 13 CWP using a medium  ResMed Mirage Quattro full face mask with heated humidifier.   . Rash 04/17/2014  . Renal calculus 05/29/2010   CT scan of abdomen/pelvis on 05/29/2010 showed an obstructing approximate 1-2 mm calculus at the left UVJ, and an approximate 1-2 mm left lower pole renal calculus.   Patient had continuing severe pain , and an elevation of his serum creatinine to a value of 1.75 on 06/06/2010.  The stone had apparently passed and was not seen on repeat CT 06/08/2010.  . Sleep apnea    haven't use cpap in 2 years  . Tooth pain 10/29/2017  . Urinary straining 11/02/2016    Past Surgical History:  Procedure Laterality Date  . big toe nails removed Bilateral 20 yrs ago  . CARDIOVERSION N/A 06/08/2017   Procedure: CARDIOVERSION;  Surgeon: Josue Hector, MD;  Location: [facility] ENDOSCOPY;  Service: Cardiovascular;  Laterality: N/A;  . COLONOSCOPY    . CYSTOSCOPY W/ RETROGRADES    . IR RADIOLOGIST EVAL & MGMT  02/02/2018  . LYMPHADENECTOMY Bilateral 10/06/2017   Procedure: LYMPHADENECTOMY;  Surgeon: Lucas Mallow, MD;  Location: [facility] ORS;  Service: Urology;  Laterality: Bilateral;  . ROBOT ASSISTED LAPAROSCOPIC RADICAL PROSTATECTOMY N/A 10/06/2017   Procedure: XI ROBOTIC ASSISTED LAPAROSCOPIC RADICAL PROSTATECTOMY;  Surgeon: Lucas Mallow, MD;  Location: [facility] ORS;  Service: Urology;  Laterality: N/A;  . TRANSURETHRAL RESECTION OF BLADDER TUMOR N/A 07/13/2017   Procedure: TRANSURETHRAL RESECTION OF PROSTATE;  Surgeon: Lucas Mallow, MD;  Location: [facility] ORS;  Service: Urology;  Laterality: N/A;    Current Medications: Current Meds  Medication Sig  . Accu-Chek Softclix Lancets lancets Check blood sugar three times a day as instructed  . acetaminophen (TYLENOL) 325 MG tablet Take 325-650 mg by mouth every 6 (six) hours as needed (for pain).  [name] amiodarone (PACERONE) 200 MG tablet Take 1 tablet (200 mg total) by mouth daily.  . bisacodyl (DULCOLAX) 5 MG EC tablet Take 5 mg by mouth daily as needed for moderate  constipation.  . Blood Glucose Monitoring Suppl (ACCU-CHEK AVIVA) device Use as instructed to check blood sugar up to 3 times a day  . carvedilol (COREG) 25 MG tablet Take 1 tablet (25 mg total) by mouth 2 (two) times daily with a meal.  . DULoxetine (CYMBALTA) 60 MG capsule Take 1 capsule (60 mg total) by mouth daily.  [name] gabapentin (NEURONTIN) 300 MG capsule Take 2 capsules (600 mg total) by mouth 2 (two) times daily. Take an additional third pill at night if pain is still uncontrolled.  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 Insulin Degludec-Liraglutide (XULTOPHY) 100-3.6 UNIT-MG/ML SOPN Inject 50 Units into the skin daily.  . Insulin Pen Needle (CARETOUCH PEN NEEDLES) 31G  X 6 MM MISC 1 pen by Does not apply route at bedtime.  . isosorbide mononitrate (IMDUR) 30 MG 24 hr tablet Take 1 tablet (30 mg total) by mouth daily.  [name] losartan (COZAAR) 100 MG tablet Take 1 tablet (100 mg total) by mouth daily.  . metFORMIN (GLUMETZA) 500 MG (MOD) 24 hr tablet Take 4 tablets (2,000 mg total) by mouth daily with breakfast.  . nystatin (NYSTATIN) powder Apply 1 application topically 3 (three) times daily.  [name] omeprazole (PRILOSEC) 40 MG capsule Take 1 capsule (40 mg total) by mouth 2 (two) times daily before a meal.  . rivaroxaban (XARELTO) 20 MG TABS tablet Take 1 tablet (20 mg total) by mouth daily with breakfast.  . spironolactone (ALDACTONE) 50 MG tablet Take 1 tablet (50 mg total) by mouth daily.     Allergies:   Lisinopril   Social History   Socioeconomic History  . Marital status: Single    Spouse name: Not on file  . Number of children: 4  . Years of education: 62  . Highest education level: Not on file  Occupational History  . Occupation:        Employer: UNEMPLOYED  Tobacco Use  . Smoking status: Never Smoker  . Smokeless tobacco: Never Used  Substance and Sexual Activity  . Alcohol use: No     Alcohol/week: 0.0 standard drinks  . Drug use: No  . Sexual activity: Not Currently  Other Topics Concern  . Not on file  Social History Narrative   Divorced, 4 children, lives alone.     Social Determinants of Health   Financial Resource Strain: Low Risk   . Difficulty of Paying Living Expenses: Not hard at all  Food Insecurity: No Food Insecurity  . Worried About Charity fundraiser in the Last Year: Never true  . Ran Out of Food in the Last Year: Never true  Transportation Needs: No Transportation Needs  . Lack of Transportation (Medical): No  . Lack of Transportation (Non-Medical): No  Physical Activity: Inactive  . Days of Exercise per Week: 0 days  . Minutes of Exercise per Session: 0 min  Stress:   . Feeling of Stress :   Social Connections: Unknown  . Frequency of Communication with Friends and Family: More than three times a week  . Frequency of Social Gatherings with Friends and Family: More than three times a week  . Attends Religious Services: More than 4 times per year  . Active Member of Clubs or Organizations: Not on file  . Attends Archivist Meetings: Not on file  . Marital Status: Not on file     Family History: The patient's family history includes Breast cancer in his mother; Diabetes in his maternal grandmother; Hypertension in his father. There is no history of Colon cancer, Prostate cancer, Heart attack, Esophageal cancer, Inflammatory bowel disease, Liver disease, Pancreatic cancer, Rectal cancer, or Stomach cancer.  ROS:   Please see the history of present illness.     All other systems reviewed and are negative.  EKGs/Labs/Other Studies Reviewed:    The following studies were reviewed today:  Echo 02/23/2019 1. Left ventricular ejection fraction, by visual estimation, is 35 to  40%. The left ventricle has moderately decreased function. Normal left  ventricular size. There is moderate left ventricular hypertrophy. There is  blobal left  ventricular hypokinesis,  without regional variation.  2. Left ventricular diastolic Doppler parameters are consistent with  impaired relaxation pattern of LV diastolic  filling.  3. Global right ventricle has mildly reduced systolic function.The right  ventricular size is not well visualized. Right vetricular wall thickness  was not assessed.  4. Left atrial size was mildly dilated.  5. Right atrial size was mildly dilated.  6. The aortic valve is normal in structure. Aortic valve regurgitation  was not visualized by color flow Doppler.  7. The aortic root was not well visualized.  8. TR signal is inadequate for assessing pulmonary artery systolic  pressure.  9. The inferior vena cava is normal in size with greater than 50%  respiratory variability, suggesting right atrial pressure of 3 mmHg.  10. The mitral valve is normal in structure. No evidence of mitral valve  regurgitation.  11. The tricuspid valve is normal in structure. Tricuspid valve  regurgitation was not visualized by color flow Doppler.  12. The pulmonic valve was not well visualized. Pulmonic valve  regurgitation is not visualized by color flow Doppler.   EKG:  EKG is ordered today.  The ekg ordered today demonstrates normal sinus rhythm, no significant ST-T wave changes  Recent Labs: 12/26/2018: TSH 2.410 04/23/2019: ALT 24 08/10/2019: B Natriuretic Peptide 220.7 08/11/2019: Hemoglobin 11.6; Platelets 182 08/13/2019: Magnesium 2.3 08/23/2019: BUN 7; Creatinine, Ser 1.33; Potassium 4.2; Sodium 139  Recent Lipid Panel    Component Value Date/Time   CHOL 101 01/09/2018 0509   CHOL 130 08/22/2017 1151   TRIG 57 01/09/2018 0509   HDL 26 (L) 01/09/2018 0509   HDL 39 (L) 08/22/2017 1151   CHOLHDL 3.9 01/09/2018 0509   VLDL 11 01/09/2018 0509   LDLCALC 64 01/09/2018 0509   LDLCALC 75 08/22/2017 1151    Physical Exam:    VS:  BP 118/78   Pulse 82   Ht 6\' 1"  (1.854 m)   Wt (!) 322 lb 3.2 oz (146.1 kg)   BMI  42.51 kg/m     Wt Readings from Last 3 Encounters:  09/13/19 (!) 322 lb 3.2 oz (146.1 kg)  08/23/19 (!) 320 lb 11.2 oz (145.5 kg)  08/14/19 (!) 318 lb 1.6 oz (144.3 kg)     GEN:  Well nourished, well developed in no acute distress HEENT: Normal NECK: No JVD; No carotid bruits LYMPHATICS: No lymphadenopathy CARDIAC: RRR, no murmurs, rubs, gallops RESPIRATORY:  Clear to auscultation without rales, wheezing or rhonchi  ABDOMEN: Soft, non-tender, non-distended MUSCULOSKELETAL:  No edema; No deformity  SKIN: Warm and dry NEUROLOGIC:  Alert and oriented x 3 PSYCHIATRIC:  Normal affect   ASSESSMENT:    1. Chronic combined systolic and diastolic CHF (congestive heart failure) ([place])   2. PAF (paroxysmal atrial fibrillation) ([place])   3. Essential hypertension   4. Hyperlipidemia LDL goal <70   5. Controlled type 2 diabetes mellitus without complication, without long-term current use of insulin (HCC)    PLAN:    In order of problems listed above:  1. Chronic combined systolic and diastolic heart failure: He was recently admitted for volume overload and diuresed 7 L.  Continue on current dose of torsemide and spironolactone  2. PAF: On Xarelto and amiodarone.  3. Hypertension: Blood pressure stable on current therapy  4. Hyperlipidemia: Overdue for repeat fasting lipid panel.  5. DM2: Managed by primary care provider.   Medication Adjustments/Labs and Tests Ordered: Current medicines are reviewed at length with the patient today.  Concerns regarding medicines are outlined above.  No orders of the defined types were placed in this encounter.  No orders of the defined  types were placed in this encounter.   Patient Instructions  Medication Instructions:  Continue current medications  *If you need a refill on your cardiac medications before your next appointment, please call your pharmacy*   Lab Work: None Ordered   Testing/Procedures: None Ordered   Follow-Up: At  [facility], you and your health needs are our priority.  As part of our continuing mission to provide you with exceptional heart care, we have created designated Provider Care Teams.  These Care Teams include your primary Cardiologist (physician) and Advanced Practice Providers (APPs -  Physician Assistants and Nurse Practitioners) who all work together to provide you with the care you need, when you need it.  We recommend signing up for the patient portal called "MyChart".  Sign up information is provided on this After Visit Summary.  MyChart is used to connect with patients for Virtual Visits (Telemedicine).  Patients are able to view lab/test results, encounter notes, upcoming appointments, etc.  Non-urgent messages can be sent to your provider as well.   To learn more about what you can do with MyChart, go to [email or web address].    Your next appointment:   3-4 month(s)  The format for your next appointment:   In Person  Provider:   You may see [name], MD or one of the following Advanced Practice Providers on your designated Care Team:    Almyra Deforest, PA-C  Fabian Sharp, PA-C or   Roby Lofts, PA-C        Signed, [place], [place]  09/15/2019 11:19 PM    [place]

## 2019-09-13 NOTE — Patient Instructions (Signed)
Medication Instructions:  Continue current medications  *If you need a refill on your cardiac medications before your next appointment, please call your pharmacy*   Lab Work: None Ordered   Testing/Procedures: None Ordered   Follow-Up: At [facility], you and your health needs are our priority.  As part of our continuing mission to provide you with exceptional heart care, we have created designated Provider Care Teams.  These Care Teams include your primary Cardiologist (physician) and Advanced Practice Providers (APPs -  Physician Assistants and Nurse Practitioners) who all work together to provide you with the care you need, when you need it.  We recommend signing up for the patient portal called "MyChart".  Sign up information is provided on this After Visit Summary.  MyChart is used to connect with patients for Virtual Visits (Telemedicine).  Patients are able to view lab/test results, encounter notes, upcoming appointments, etc.  Non-urgent messages can be sent to your provider as well.   To learn more about what you can do with MyChart, go to [email or web address].    Your next appointment:   3-4 month(s)  The format for your next appointment:   In Person  Provider:   You may see [name], MD or one of the following Advanced Practice Providers on your designated Care Team:    Almyra Deforest, PA-C  Fabian Sharp, PA-C or   Roby Lofts, [place]

## 2019-09-15 ENCOUNTER — Encounter: Payer: Self-pay | Admitting: Physician Assistant

## 2019-09-17 ENCOUNTER — Telehealth: Payer: Self-pay | Admitting: Internal Medicine

## 2019-09-17 NOTE — Telephone Encounter (Signed)
  Community Resource Referral   [place] 09/17/2019 1st Attempt  Name: Terry Parks   MRN: [id number]   DOB: November 09, 1955   AGE: 64 y.o.   GENDER: male   PCP [name], MD.   Called pt regarding Community Resource Referral LMTCB Follow up on: 09/18/19  [place] . [email or web address]  [phone]

## 2019-09-17 NOTE — Progress Notes (Signed)
Internal Medicine Clinic Attending  CCM services provided by the care management provider and their documentation were discussed with Dr. Winfrey. We reviewed the pertinent findings, urgent action items addressed by the resident and non-urgent items to be addressed by the PCP.  I agree with the assessment, diagnosis, and plan of care documented in the CCM and resident's note.  Brystal Kildow, MD 09/17/2019  

## 2019-09-18 ENCOUNTER — Telehealth: Payer: Medicaid Other

## 2019-09-18 MED FILL — METFORMIN HCL ER 500 MG TB2: 500 | 30 days supply | Qty: 120 | Fill #0

## 2019-09-18 MED FILL — NYSTATIN 100000 UNIT/GM POW: 100000 | 10 days supply | Qty: 15 | Fill #0

## 2019-09-18 MED FILL — XULTOPHY 100 UNIT-3.6MG/ML: 100-3.6 | 30 days supply | Qty: 15 | Fill #0

## 2019-09-18 NOTE — Telephone Encounter (Signed)
  Community Resource Referral   [place] 09/18/2019  Name: Majeed Tamashiro   MRN: [id number]   DOB: August 19, 1955   AGE: 64 y.o.   GENDER: male   PCP [name], MD.   Called pt regarding Community Resource Referral for dental assistance. Pt stated he was just leaving [facility] on [address] and was told that they do not accept his Medicaid plan (only [place]) He stated that he made an appt with dentist in [facility] for 6/24 but would like to find something sooner. Asked that I email him list of resources for [facility]. Was familiar with [facility] on [address]. Discussed [facility] option as well. Care Guide will email  [email or web address] and Dental Resources-[email or web address].docx along with contact information should pt need any further assitance. Closing referral pending any other needs of patient.   [place] . [email or web address]  [phone]

## 2019-09-18 NOTE — Telephone Encounter (Signed)
Email to pt  From: Jill Alexanders [facility])  Sent: Tuesday, Sep 18, 2019 10:27 AM To: [email or web address] Subject: Secure: [place] Morning Mr. [place], Please find attached list of dental resources for [facility]. Let me know if you have any further questions.  [place] . [email or web address]  [phone]

## 2019-09-18 NOTE — Addendum Note (Signed)
Addended by: Vennie Homans on: 09/18/2019 11:58 AM   Modules accepted: Orders

## 2019-09-19 ENCOUNTER — Ambulatory Visit: Payer: Medicaid Other | Admitting: *Deleted

## 2019-09-19 DIAGNOSIS — E1142 Type 2 diabetes mellitus with diabetic polyneuropathy: Secondary | ICD-10-CM

## 2019-09-19 DIAGNOSIS — E1159 Type 2 diabetes mellitus with other circulatory complications: Secondary | ICD-10-CM

## 2019-09-19 DIAGNOSIS — I5042 Chronic combined systolic (congestive) and diastolic (congestive) heart failure: Secondary | ICD-10-CM

## 2019-09-19 NOTE — Chronic Care Management (AMB) (Signed)
  Care Management   Note  09/19/2019 Name: Terry Parks MRN: [id number] DOB: Oct 01, 1955  Returned call to Suanne Marker at [facility] on 09/18/19 after she called this CCM RN. She states she was calling a patient's request to be on the line when the assessment for PCS services was being done. Suanne Marker states patient was able to provide a list of his current medications by reading off a list he had at home. She said no further information was needed a this time.  The care management team will reach out to the patient again over the next 7 days.   [name] RN, CCM, [facility] RN Care Manager [phone]

## 2019-09-21 ENCOUNTER — Telehealth: Payer: Medicaid Other

## 2019-09-21 ENCOUNTER — Ambulatory Visit: Payer: Medicaid Other

## 2019-09-21 DIAGNOSIS — E1159 Type 2 diabetes mellitus with other circulatory complications: Secondary | ICD-10-CM

## 2019-09-21 DIAGNOSIS — E1142 Type 2 diabetes mellitus with diabetic polyneuropathy: Secondary | ICD-10-CM

## 2019-09-21 NOTE — Patient Instructions (Signed)
Visit Information  Goals Addressed            This Visit's Progress   . "I need a refill on my Metformin and the name of the dentist I have used in the past as I need some work done on my teeth" (pt-stated)       [place] (see longitudinal plan of care for additional care plan information)  Current Barriers:  Terry Parks Knowledge Deficits related to name of dentist he has used in past and needs refill on Metformin- patient texted this CCM RNCM and said the number and the name of the dentist that was provided to him was incorrect. He still wants help with securing a dentist that takes Medicaid.   Nurse Case Manager Clinical Goal(s):  Terry Parks Over the next 7 days, patient will work with chronic care management team to address needs related to medication refill and previous dentist  Interventions:  . Inquired if patient has located dental provider.  Patient has appointment at [facility] on 11/08/19.  Patient states today that he is in a lot of pain and asked about "emergency referral" being placed by clinic provider.  Nash Dimmer with clinic Director, Yvonna Alanis, regarding this request.  Unfortunately, this request cannot be accommodated.  . Called patient back and provided update.  Encouraged him to contact [facility] and request being worked in to schedule or contacted if there is a cancellation.    . Patient Self Care Activities:  . Patient verbalizes understanding of plan to work with chronic care team and provider for medication refills and name of dentist he was referred to in the past . Self administers medications as prescribed . Attends all scheduled provider appointments . Calls pharmacy for medication refills . Unable to independently recall name of dentist he saw in the past and refill Metformin  Please see past updates related to this goal by clicking on the "Past Updates" button in the selected goal      . "I want to try to get personal care  services since I can't afford ALF" (pt-stated)       CARE PLAN ENTRY (see longitudinal plan of care for additional care plan information)  Current Barriers:  . ADL IADL limitations   Clinical Social Work Clinical Goal(s):  Terry Parks Over the next 30 days, patient will work with SW to pursue personal care services.   Interventions: . Called patient regarding status of PCS.  Per patient, he has been approved for services but they have not yet initiated.   . Plan to follow up with patient to ensure that services initiate.   Patient Self Care Activities:  . Self administers medications as prescribed . Attends all scheduled provider appointments .   Please see past updates related to this goal by clicking on the "Past Updates" button in the selected goal      . "My doctor was supposed to order diabetic socks and a waist band for my hernia" (pt-stated)       [place] (see longitudinal plan of care for additional care plan information)  Current Barriers:  . Need for DME  Clinical Social Work Clinical Goal(s):  Terry Parks Over the next 60 days, patient will work with SW to address concerns related to DME  Interventions: . Inter-disciplinary care team collaboration (see longitudinal plan of care) . Community message sent to Darlina Guys with [facility] regarding status of DME  Patient Self Care Activities:  . Self administers  medications as prescribed . Attends all scheduled provider appointments .  Initial goal documentation        Patient verbalizes understanding of instructions provided today.   The patient has been provided with contact information for the care management team and has been advised to call with any health related questions or concerns.     Ronn Melena, [place] Coordination Social Worker [place] [phone]

## 2019-09-21 NOTE — Chronic Care Management (AMB) (Signed)
Care Management   Follow Up Note   09/21/2019 Name: Terry Parks MRN: [id number] DOB: 1955-05-25  Referred by: [name], MD Reason for referral : Care Coordination (PCS, Dental resources, DME)   Terry Parks is a 64 y.o. year old male who is a primary care patient of [name], [name], MD. The care management team was consulted for assistance with care management and care coordination needs.    Review of patient status, including review of consultants reports, relevant laboratory and other test results, and collaboration with appropriate care team members and the patient's provider was performed as part of comprehensive patient evaluation and provision of chronic care management services.    SDOH (Social Determinants of Health) assessments performed: No See Care Plan activities for detailed interventions related to Terry Parks)     Advanced Directives: See Care Plan and Vynca application for related entries.   Goals Addressed            This Visit's Progress   . "I need a refill on my Metformin and the name of the dentist I have used in the past as I need some work done on my teeth" (pt-stated)       [place] (see longitudinal plan of care for additional care plan information)  Current Barriers:  [name] Knowledge Deficits related to name of dentist he has used in past and needs refill on Metformin- patient texted this CCM RNCM and said the number and the name of the dentist that was provided to him was incorrect. He still wants help with securing a dentist that takes Medicaid.   Nurse Case Manager Clinical Goal(s):  [name] Over the next 7 days, patient will work with chronic care management team to address needs related to medication refill and previous dentist  Interventions:  . Inquired if patient has located dental provider.  Patient has appointment at [facility] on 11/08/19.  Patient states today that he is in a lot of pain and asked about "emergency referral"  being placed by clinic provider.  Terry Parks with clinic Director, Terry Parks, regarding this request.  Unfortunately, this request cannot be accommodated.  . Called patient back and provided update.  Encouraged him to contact [facility] and request being worked in to schedule or contacted if there is a cancellation.    . Patient Self Care Activities:  . Patient verbalizes understanding of plan to work with chronic care team and provider for medication refills and name of dentist he was referred to in the past . Self administers medications as prescribed . Attends all scheduled provider appointments . Calls pharmacy for medication refills . Unable to independently recall name of dentist he saw in the past and refill Metformin  Please see past updates related to this goal by clicking on the "Past Updates" button in the selected goal      . "I want to try to get personal care services since I can't afford ALF" (pt-stated)       CARE PLAN ENTRY (see longitudinal plan of care for additional care plan information)  Current Barriers:  . ADL IADL limitations   Clinical Social Work Clinical Goal(s):  [name] Over the next 30 days, patient will work with SW to pursue personal care services.   Interventions: . Called patient regarding status of PCS.  Per patient, he has been approved for services but they have not yet initiated.   . Plan to follow up with patient to ensure that services initiate.  Patient Self Care Activities:  . Self administers medications as prescribed . Attends all scheduled provider appointments .   Please see past updates related to this goal by clicking on the "Past Updates" button in the selected goal      . "My doctor was supposed to order diabetic socks and a waist band for my hernia" (pt-stated)       [facility] (see longitudinal plan of care for additional care plan information)  Current Barriers:  . Need for DME  Clinical Social Work  Clinical Goal(s):  [name] Over the next 60 days, patient will work with SW to address concerns related to DME  Interventions: . Inter-disciplinary care team collaboration (see longitudinal plan of care) . Community message sent to Terry Parks with [facility] [facility] regarding status of DME  Patient Self Care Activities:  . Self administers medications as prescribed . Attends all scheduled provider appointments .  Initial goal documentation         The patient has been provided with contact information for the care management team and has been advised to call with any health related questions or concerns.     Terry Parks, [place] Coordination Social Worker [place] [phone]

## 2019-09-24 NOTE — Progress Notes (Signed)
Internal Medicine Clinic Attending  CCM services provided by the care management provider and their documentation were discussed with Dr. [name]. We reviewed the pertinent findings, urgent action items addressed by the resident and non-urgent items to be addressed by the PCP.  I agree with the assessment, diagnosis, and plan of care documented in the CCM and resident's note.  Reice Bienvenue, MD 09/24/2019  

## 2019-09-25 ENCOUNTER — Ambulatory Visit: Payer: Medicaid Other

## 2019-09-25 DIAGNOSIS — I5042 Chronic combined systolic (congestive) and diastolic (congestive) heart failure: Secondary | ICD-10-CM

## 2019-09-25 DIAGNOSIS — E1142 Type 2 diabetes mellitus with diabetic polyneuropathy: Secondary | ICD-10-CM

## 2019-09-25 DIAGNOSIS — E1159 Type 2 diabetes mellitus with other circulatory complications: Secondary | ICD-10-CM

## 2019-09-25 DIAGNOSIS — G4733 Obstructive sleep apnea (adult) (pediatric): Secondary | ICD-10-CM | POA: Diagnosis not present

## 2019-09-25 NOTE — Patient Instructions (Signed)
Visit Information  Goals Addressed            This Visit's Progress   . "My doctor was supposed to order diabetic socks and a waist band for my hernia" (pt-stated)       [place] (see longitudinal plan of care for additional care plan information)  Current Barriers:  . Need for DME  Clinical Social Work Clinical Goal(s):  [name] Over the next 60 days, patient will work with SW to address concerns related to DME  Interventions: . Received the following communication from Skeet Latch at [facility].  "both of those items are retail only items and do not need a RX nor do we bill to the insurance. The patient or relative, can just walk into one of our retail locations and purchase these items." . Contacted patient and provided the above information   Patient Self Care Activities:  . Self administers medications as prescribed . Attends all scheduled provider appointments .  Please see past updates related to this goal by clicking on the "Past Updates" button in the selected goal         Patient verbalizes understanding of instructions provided today.   The patient has been provided with contact information for the care management team and has been advised to call with any health related questions or concerns.     Ronn Melena, [place] Coordination Social Worker [place] [phone]

## 2019-09-25 NOTE — Chronic Care Management (AMB) (Signed)
  Care Management   Follow Up Note   09/25/2019 Name: Terry Parks MRN: [id number] DOB: Jun 14, 1955  Referred by: [name], MD Reason for referral : Care Coordination (DME)   Terry Parks is a 64 y.o. year old male who is a primary care patient of [name], [name], MD. The care management team was consulted for assistance with care management and care coordination needs.    Review of patient status, including review of consultants reports, relevant laboratory and other test results, and collaboration with appropriate care team members and the patient's provider was performed as part of comprehensive patient evaluation and provision of chronic care management services.    SDOH (Social Determinants of Health) assessments performed: No See Care Plan activities for detailed interventions related to [facility])     Advanced Directives: See Care Plan and Vynca application for related entries.   Goals Addressed            This Visit's Progress   . "My doctor was supposed to order diabetic socks and a waist band for my hernia" (pt-stated)       [place] (see longitudinal plan of care for additional care plan information)  Current Barriers:  . Need for DME  Clinical Social Work Clinical Goal(s):  [name] Over the next 60 days, patient will work with SW to address concerns related to DME  Interventions: . Received the following communication from Skeet Latch at [facility].  "both of those items are retail only items and do not need a RX nor do we bill to the insurance. The patient or relative, can just walk into one of our retail locations and purchase these items." . Contacted patient and provided the above information   Patient Self Care Activities:  . Self administers medications as prescribed . Attends all scheduled provider appointments .  Please see past updates related to this goal by clicking on the "Past Updates" button in the selected goal          The patient  has been provided with contact information for the care management team and has been advised to call with any health related questions or concerns.       Ronn Melena, [place] Coordination Social Worker [place] [phone]

## 2019-09-26 DIAGNOSIS — G4733 Obstructive sleep apnea (adult) (pediatric): Secondary | ICD-10-CM | POA: Diagnosis not present

## 2019-09-27 ENCOUNTER — Telehealth: Payer: Medicaid Other

## 2019-09-27 ENCOUNTER — Ambulatory Visit: Payer: Self-pay | Admitting: *Deleted

## 2019-09-27 DIAGNOSIS — I5042 Chronic combined systolic (congestive) and diastolic (congestive) heart failure: Secondary | ICD-10-CM

## 2019-09-27 DIAGNOSIS — G4733 Obstructive sleep apnea (adult) (pediatric): Secondary | ICD-10-CM | POA: Diagnosis not present

## 2019-09-27 DIAGNOSIS — E1142 Type 2 diabetes mellitus with diabetic polyneuropathy: Secondary | ICD-10-CM

## 2019-09-27 NOTE — Chronic Care Management (AMB) (Signed)
  Care Management   Outreach Note  09/27/2019 Name: Avon Brahmbhatt MRN: [id number] DOB: November 02, 1955  Referred by: [name], MD Reason for referral : Care Coordination (DM, HF, HTN)   An unsuccessful telephone outreach was attempted today. The patient was referred to the case management team for assistance with care management and care coordination.   Follow Up Plan: The care management team will reach out to the patient again over the next 7 days.   [name] RN, CCM, [facility] RN Care Manager [phone]

## 2019-09-28 DIAGNOSIS — G4733 Obstructive sleep apnea (adult) (pediatric): Secondary | ICD-10-CM | POA: Diagnosis not present

## 2019-10-01 DIAGNOSIS — G4733 Obstructive sleep apnea (adult) (pediatric): Secondary | ICD-10-CM | POA: Diagnosis not present

## 2019-10-01 NOTE — Progress Notes (Signed)
Internal Medicine Clinic Resident  I have personally reviewed this encounter including the documentation in this note and/or discussed this patient with the care management provider. I will address any urgent items identified by the care management provider and will communicate my actions to the patient's PCP. I have reviewed the patient's CCM visit with my supervising attending, Dr Raines.  Shannin Naab K Meriem Lemieux, MD 10/01/2019   

## 2019-10-02 ENCOUNTER — Ambulatory Visit: Payer: Medicaid Other | Admitting: *Deleted

## 2019-10-02 DIAGNOSIS — G4733 Obstructive sleep apnea (adult) (pediatric): Secondary | ICD-10-CM | POA: Diagnosis not present

## 2019-10-02 DIAGNOSIS — I5042 Chronic combined systolic (congestive) and diastolic (congestive) heart failure: Secondary | ICD-10-CM

## 2019-10-02 DIAGNOSIS — E1142 Type 2 diabetes mellitus with diabetic polyneuropathy: Secondary | ICD-10-CM

## 2019-10-02 NOTE — Progress Notes (Signed)
Internal Medicine Clinic Resident  I have personally reviewed this encounter including the documentation in this note and/or discussed this patient with the care management provider. I will address any urgent items identified by the care management provider and will communicate my actions to the patient's PCP. I have reviewed the patient's CCM visit with my supervising attending, Dr Lynnae January.  Harvie Heck, MD  Internal Medicine, PGY-1 10/02/2019

## 2019-10-02 NOTE — Patient Instructions (Signed)
Visit Information It was nice speaking with you today. Keep up the good work.   Goals Addressed            This Visit's Progress     Patient Stated   . " I am doing much better now and I really like the PCS worker that came to my house today." (pt-stated)       CARE PLAN ENTRY (see longitudinal plan of care for additional care plan information)  Current Barriers:  . Chronic Disease Management support, education, and care coordination needs related to CHF and DMII- patient reports he is recording his BP, Weight, pulse daily in his phone, he voices the correct parameters that require provider notification  Clinical Goal(s) related to CHF and DMII:  Over the next 30 days, patient will:  . Work with the care management team to address educational, disease management, and care coordination needs  . Begin or continue self health monitoring activities as directed today Measure and record CBG (blood glucose) at least one time daily and Measure and record blood pressure one time daily . Call provider office for new or worsened signs and symptoms Blood glucose findings outside established parameters, Blood pressure findings outside established parameters, Chest pain, Shortness of breath, and New or worsened symptom related to HF and DM II . Call care management team with questions or concerns . Verbalize basic understanding of patient centered plan of care established today  Interventions related to CHF and DMII:  . Evaluation of current treatment plans and patient's adherence to plan as established by provider . Assessed patient understanding of disease states . Assessed patient's education and care coordination needs . Provided disease specific education to patient  . Collaborated with appropriate clinical care team members regarding patient needs . Positive reinforcement provide to patient for taking daily vitals and recording them in his phone and taking responsibility for his  health Patient Self Care Activities related to CHF and DMII:  . Patient is unable to independently self-manage chronic health conditions  Please see past updates related to this goal by clicking on the "Past Updates" button in the selected goal      . " the gabapentin does not help my leg pain, it's unbearable at times and I can't care for myself I hurt so bad' (pt-stated)       [place] (see longitudinal plan of care for additional care plan information)  Current Barriers:  [name] Knowledge Deficits related to pain management- patient reports his PCS worker rubbed some highly recommended liniment cream on his legs today and he hopes it helps the pain  Nurse Case Manager Clinical Goal(s):  [name] Over the next 30 days, patient will verbalizes or demonstrate relief or control of pain and demonstrate increased ability for self care   Interventions:  . Completed pain assessment . Reviewed pain medications with patient and discussed efficacy . Encouraged patient to discuss ongoing leg pain issues with provider during 10/11/19 appointment and for consideration of referral to pain management clinic/chiropractor/etc if increasing gabapentin dosage does not help . Reminded patient about his Clinic appointment on 10/11/19 at 1:30 pm and ensured he has the number for Medicaid transportation and the need to call in advance to arrange for transportation  Patient Self Care Activities:  . Patient verbalizes understanding of plan to discuss pain with provider . Unable to self administer medications as prescribed . Unable to perform ADLs independently . Unable to perform IADLs independently  Please see past updates  related to this goal by clicking on the "Past Updates" button in the selected goal      . "I'm doing much better with the swelling in my legs but I think I'm depressed and I have alot of things I want to talk to my doctor about when I see her in May." (pt-stated)       [place] (see  longitudinal plan of care for additional care plan information)   Current Barriers:  [name] Knowledge deficit related to basic heart failure pathophysiology and self care management- patient states he is feeling much better, no longer depressed and feels he is managing his health issues well and appreciates the help with ALS and IADLs that the [facility] worker is providing  Case Manager Clinical Goal(s):  [name] Over the next 30 days, patient will weigh self daily and record . Over the next 30 days, patient will verbalize understanding of Heart Failure Action Plan and when to call doctor . Over the next 30 days, patient will take all Heart Failure mediations as prescribed . Over the next 30 days, patient will weigh daily and record (notifying MD of 3 lb weight gain over night or 5 lb in a week)  Interventions:  . Pt interviewed and appropriate assessment performed  Patient Self Care Activities:  . Weighs daily and record (notifying MD of 3 lb weight gain over night or 5 lb in a week) . Takes Heart Failure Medications as prescribed . Verbalizes understanding of and follows CHF Action Plan . Adheres to low sodium diet  Please see past updates related to this goal by clicking on the "Past Updates" button in the selected goal      . "I've got to have my teeth worked on, they are in bad shape." (pt-stated)       [place] (see longitudinal plan of care for additional care plan information)  Current Barriers:  [name] Knowledge Deficits related to securing dental care that accepts Medicaid  Nurse Case Manager Clinical Goal(s):  [name] Over the next 30 days, patient will work with chronic care management team to address needs related to making dental appointment- Patient states he'd like to be seen by a dentist before 6/24 but he accidentally deleted the email community care guide Jill Alexanders sent him with the [facility] dental providers.   Interventions:  . Inquired if patient has located  dental provider.  Patient has appointment at [facility] on [facility]08[facility]19.   Nash Dimmer with community care guide Jill Alexanders and asked her to e-mail patient another copy of the Medicaid dental providers in [facility].  . Patient Self Care Activities:  . Patient verbalizes understanding of plan to work with chronic care team and provider for medication refills and name of dentist he was referred to in the past . Self administers medications as prescribed . Attends all scheduled provider appointments . Calls pharmacy for medication refills . Unable to independently recall name of dentist he saw in the past and refill Metformin  Please see past updates related to this goal by clicking on the "Past Updates" button in the selected goal      . "My doctor was supposed to order diabetic socks and a waist band for my hernia" (pt-stated)       [place] (see longitudinal plan of care for additional care plan information)  Current Barriers:  . Need for DME - pt states he has not purchased abdominal binder and diabetic  socks but says he went to [facility] and they did not have them  Clinical Social Work Clinical Goal(s):  [name] Over the next 60 days, patient will work with SW to address concerns related to DME  Interventions: . Re-educated patient that the DME items he needs do not require a Rx and can be purchased at a DME retail store. Provided patient with name and locations of DME retail stores in [place]  Patient Self Care Activities:  . Self administers medications as prescribed . Attends all scheduled provider appointments .  Please see past updates related to this goal by clicking on the "Past Updates" button in the selected goal         The patient verbalized understanding of instructions provided today and declined a print copy of patient instruction materials.   The care management team will reach out to the patient again over the next 30 days.   [name]  RN, CCM, [facility] RN Care Manager [phone]

## 2019-10-02 NOTE — Chronic Care Management (AMB) (Signed)
Chronic Care Management   Follow Up Note   10/02/2019 Name: Terry Parks MRN: [id number] DOB: 05-Mar-1956  Referred by: Terry Mage, MD Reason for referral : Care Coordination (HF, DM, HTN)   Terry Parks is a 64 y.o. year old male who is a primary care patient of [name], [name], MD. Terry CCM team was consulted for assistance with chronic disease management and care coordination needs.    Review of patient status, including review of consultants reports, relevant laboratory and other test results, and collaboration with appropriate care team members and Terry patient's Terry Parks was performed as part of comprehensive patient evaluation and provision of chronic care management services.    SDOH (Social Determinants of Health) assessments performed: No See Care Plan activities for detailed interventions related to Terry Parks)     Outpatient Encounter Medications as of 10/02/2019  Medication Sig  . Accu-Chek Softclix Lancets lancets Check blood sugar three times a day as instructed  . acetaminophen (TYLENOL) 325 MG tablet Take 325-650 mg by mouth every 6 (six) hours as needed (for pain).  [name] amiodarone (PACERONE) 200 MG tablet Take 1 tablet (200 mg total) by mouth daily.  . bisacodyl (DULCOLAX) 5 MG EC tablet Take 5 mg by mouth daily as needed for moderate constipation.  . Blood Glucose Monitoring Suppl (ACCU-CHEK AVIVA) device Use as instructed to check blood sugar up to 3 times a day  . carvedilol (COREG) 25 MG tablet Take 1 tablet (25 mg total) by mouth 2 (two) times daily with a meal.  . DULoxetine (CYMBALTA) 60 MG capsule Take 1 capsule (60 mg total) by mouth daily.  [name] gabapentin (NEURONTIN) 300 MG capsule Take 2 capsules (600 mg total) by mouth 2 (two) times daily. Take an additional third pill at night if pain is still uncontrolled.  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 Insulin Degludec-Liraglutide  (XULTOPHY) 100-3.6 UNIT-MG/ML SOPN Inject 50 Units into Terry skin daily.  . Insulin Pen Needle (CARETOUCH PEN NEEDLES) 31G X 6 MM MISC 1 pen by Does not apply route at bedtime.  . isosorbide mononitrate (IMDUR) 30 MG 24 hr tablet Take 1 tablet (30 mg total) by mouth daily.  [name] losartan (COZAAR) 100 MG tablet Take 1 tablet (100 mg total) by mouth daily.  . metFORMIN (GLUMETZA) 500 MG (MOD) 24 hr tablet Take 4 tablets (2,000 mg total) by mouth daily with breakfast.  . nystatin (NYSTATIN) powder Apply 1 application topically 3 (three) times daily.  [name] omeprazole (PRILOSEC) 40 MG capsule Take 1 capsule (40 mg total) by mouth 2 (two) times daily before a meal.  . rivaroxaban (XARELTO) 20 MG TABS tablet Take 1 tablet (20 mg total) by mouth daily with breakfast.  . spironolactone (ALDACTONE) 50 MG tablet Take 1 tablet (50 mg total) by mouth daily.  [name] torsemide (DEMADEX) 20 MG tablet Take 2.5 tablets (50 mg total) by mouth 2 (two) times daily. (Patient taking differently: Take 60 mg by mouth 2 (two) times daily. Take med before 5p)   No facility-administered encounter medications on file as of 10/02/2019.     Objective:  BP Readings from Last 3 Encounters:  09/13/19 118/78  08/23/19 (!) 169/99  08/14/19 130/77   Wt Readings from Last 3 Encounters:  09/13/19 (!) 322 lb 3.2 oz (146.1 kg)  08/23/19 (!) 320 lb 11.2 oz (145.5 kg)  08/14/19 (!) 318 lb 1.6 oz (144.3 kg)  Lab Results  Component Value Date   HGBA1C 12.7 (A) 07/12/2019   HGBA1C 7.4 (A) 01/31/2019   HGBA1C 10.3 (A) 10/13/2018   Lab Results  Component Value Date   MICROALBUR 11.3 (H) 04/17/2014   LDLCALC 64 01/09/2018   CREATININE 1.33 (H) 08/23/2019   Lab Results  Component Value Date   LABMICR 121.9 11/10/2018   LABMICR Comment 02/13/2018   MICROALBUR 11.3 (H) 04/17/2014   MICROALBUR 16.26 (H) 01/24/2013   Needs updated urine for protein   Goals Addressed            This Visit's Progress     Patient Stated   .  " I am doing much better now and I really like Terry [employer]CS worker that came to my house today." (pt-stated)       [place] (see longitudinal plan of care for additional care plan information)  Current Barriers:  . Chronic Disease Management support, education, and care coordination needs related to CHF and DMII- patient reports he is recording his BP, Weight, pulse daily in his phone, he voices Terry correct parameters that require Terry Parks notification  Clinical Goal(s) related to CHF and DMII:  Over Terry next 30 days, patient will:  . Work with Terry care management team to address educational, disease management, and care coordination needs  . Begin or continue self health monitoring activities as directed today Measure and record CBG (blood glucose) at least one time daily and Measure and record blood pressure one time daily . Call Terry Parks office for new or worsened signs and symptoms Blood glucose findings outside established parameters, Blood pressure findings outside established parameters, Chest pain, Shortness of breath, and New or worsened symptom related to HF and DM II . Call care management team with questions or concerns . Verbalize basic understanding of patient centered plan of care established today  Interventions related to CHF and DMII:  . Evaluation of current treatment plans and patient's adherence to plan as established by Terry Parks . Assessed patient understanding of disease states . Assessed patient's education and care coordination needs . Provided disease specific education to patient  . Collaborated with appropriate clinical care team members regarding patient needs . Positive reinforcement provide to patient for taking daily vitals and recording them in his phone and taking responsibility for his health Patient Self Care Activities related to CHF and DMII:  . Patient is unable to independently self-manage chronic health conditions  Please see past updates related to  this goal by clicking on Terry "Past Updates" button in Terry selected goal      . " Terry gabapentin does not help my leg pain, it's unbearable at times and I can't care for myself I hurt so bad' (pt-stated)       [place] (see longitudinal plan of care for additional care plan information)  Current Barriers:  [name] Knowledge Deficits related to pain management- patient reports his PCS worker rubbed some highly recommended liniment cream on his legs today and he hopes it helps Terry pain  Nurse Case Manager Clinical Goal(s):  [name] Over Terry next 30 days, patient will verbalizes or demonstrate relief or control of pain and demonstrate increased ability for self care   Interventions:  . Completed pain assessment . Reviewed pain medications with patient and discussed efficacy . Encouraged patient to discuss ongoing leg pain issues with Terry Parks during 10/11/19 appointment and for consideration of referral to pain management clinic/chiropractor/etc if increasing gabapentin dosage does not help .  Reminded patient about his Clinic appointment on 10/11/19 at 1:30 pm and ensured he has Terry number for Terry Parks transportation and Terry need to call in advance to arrange for transportation  Patient Self Care Activities:  . Patient verbalizes understanding of plan to discuss pain with Terry Parks . Unable to self administer medications as prescribed . Unable to perform ADLs independently . Unable to perform IADLs independently  Please see past updates related to this goal by clicking on Terry "Past Updates" button in Terry selected goal      . "I'm doing much better with Terry swelling in my legs but I think I'm depressed and I have alot of things I want to talk to my doctor about when I see her in May." (pt-stated)       CARE PLAN ENTRY (see longitudinal plan of care for additional care plan information)   Current Barriers:  [name] Knowledge deficit related to basic heart failure pathophysiology and self care  management- patient states he is feeling much better, no longer depressed and feels he is managing his health issues well and appreciates Terry help with ALS and IADLs that Terry Terry Parks worker is providing  Case Manager Clinical Goal(s):  [name] Over Terry next 30 days, patient will weigh self daily and record . Over Terry next 30 days, patient will verbalize understanding of Heart Failure Action Plan and when to call doctor . Over Terry next 30 days, patient will take all Heart Failure mediations as prescribed . Over Terry next 30 days, patient will weigh daily and record (notifying MD of 3 lb weight gain over night or 5 lb in a week)  Interventions:  . Pt interviewed and appropriate assessment performed  Patient Self Care Activities:  . Weighs daily and record (notifying MD of 3 lb weight gain over night or 5 lb in a week) . Takes Heart Failure Medications as prescribed . Verbalizes understanding of and follows CHF Action Plan . Adheres to low sodium diet  Please see past updates related to this goal by clicking on Terry "Past Updates" button in Terry selected goal      . "I've got to have my teeth worked on, they are in bad shape." (pt-stated)       [place] (see longitudinal plan of care for additional care plan information)  Current Barriers:  [name] Knowledge Deficits related to securing dental care that accepts Terry Parks  Nurse Case Manager Clinical Goal(s):  [name] Over Terry next 30 days, patient will work with chronic care management team to address needs related to making dental appointment- Patient states he'd like to be seen by a dentist before 6/24 but he accidentally deleted Terry e-mail community care guide Jill Parks sent him with Terry Terry Parks dental providers.   Interventions:  . Inquired if patient has located dental Jerold Yoss.  Patient has appointment at [facility] on 11/08/19.   Nash Dimmer with community care guide Jill Parks and asked her to e-mail patient  another copy of Terry Terry Parks dental providers in [facility].  . Patient Self Care Activities:  . Patient verbalizes understanding of plan to work with chronic care team and Steffon Gladu for medication refills and name of dentist he was referred to in Terry past . Self administers medications as prescribed . Attends all scheduled Marcelyn Ruppe appointments . Calls pharmacy for medication refills . Unable to independently recall name of dentist he saw in Terry past and refill Metformin  Please see past updates related to  this goal by clicking on Terry "Past Updates" button in Terry selected goal      . "My doctor was supposed to order diabetic socks and a waist band for my hernia" (pt-stated)       [place] (see longitudinal plan of care for additional care plan information)  Current Barriers:  . Need for DME - pt states he has not purchased abdominal binder and diabetic socks but says he went to Terry Parks and they did not have them  Clinical Social Work Clinical Goal(s):  [name] Over Terry next 60 days, patient will work with SW to address concerns related to DME  Interventions: . Re-educated patient that Terry DME items he needs do not require a Rx and can be purchased at a DME retail store. Provided patient with name and locations of DME retail stores in [place]  Patient Self Care Activities:  . Self administers medications as prescribed . Attends all scheduled Keri Veale appointments .  Please see past updates related to this goal by clicking on Terry "Past Updates" button in Terry selected goal          Plan:   Terry care management team will reach out to Terry patient again over Terry next 30 days.    [name] RN, CCM, [facility] RN Care Manager [phone]

## 2019-10-03 DIAGNOSIS — G4733 Obstructive sleep apnea (adult) (pediatric): Secondary | ICD-10-CM | POA: Diagnosis not present

## 2019-10-03 NOTE — Progress Notes (Signed)
Internal Medicine Clinic Attending  CCM services provided by the care management provider and their documentation were discussed with Dr. Marva Panda. We reviewed the pertinent findings, urgent action items addressed by the resident and non-urgent items to be addressed by the PCP.  I agree with the assessment, diagnosis, and plan of care documented in the CCM and resident's note.  Larey Dresser, MD 10/03/2019

## 2019-10-04 DIAGNOSIS — G4733 Obstructive sleep apnea (adult) (pediatric): Secondary | ICD-10-CM | POA: Diagnosis not present

## 2019-10-05 DIAGNOSIS — G4733 Obstructive sleep apnea (adult) (pediatric): Secondary | ICD-10-CM | POA: Diagnosis not present

## 2019-10-08 DIAGNOSIS — G4733 Obstructive sleep apnea (adult) (pediatric): Secondary | ICD-10-CM | POA: Diagnosis not present

## 2019-10-09 DIAGNOSIS — G4733 Obstructive sleep apnea (adult) (pediatric): Secondary | ICD-10-CM | POA: Diagnosis not present

## 2019-10-10 DIAGNOSIS — G4733 Obstructive sleep apnea (adult) (pediatric): Secondary | ICD-10-CM | POA: Diagnosis not present

## 2019-10-11 ENCOUNTER — Other Ambulatory Visit: Payer: Self-pay

## 2019-10-11 ENCOUNTER — Ambulatory Visit: Payer: Medicaid Other

## 2019-10-11 ENCOUNTER — Ambulatory Visit (INDEPENDENT_AMBULATORY_CARE_PROVIDER_SITE_OTHER): Payer: Medicaid Other | Admitting: Internal Medicine

## 2019-10-11 DIAGNOSIS — I5042 Chronic combined systolic (congestive) and diastolic (congestive) heart failure: Secondary | ICD-10-CM

## 2019-10-11 DIAGNOSIS — I152 Hypertension secondary to endocrine disorders: Secondary | ICD-10-CM

## 2019-10-11 DIAGNOSIS — I110 Hypertensive heart disease with heart failure: Secondary | ICD-10-CM

## 2019-10-11 DIAGNOSIS — I5023 Acute on chronic systolic (congestive) heart failure: Secondary | ICD-10-CM

## 2019-10-11 DIAGNOSIS — E1142 Type 2 diabetes mellitus with diabetic polyneuropathy: Secondary | ICD-10-CM

## 2019-10-11 DIAGNOSIS — E1159 Type 2 diabetes mellitus with other circulatory complications: Secondary | ICD-10-CM

## 2019-10-11 DIAGNOSIS — G4733 Obstructive sleep apnea (adult) (pediatric): Secondary | ICD-10-CM | POA: Diagnosis not present

## 2019-10-11 DIAGNOSIS — N393 Stress incontinence (female) (male): Secondary | ICD-10-CM | POA: Diagnosis not present

## 2019-10-11 DIAGNOSIS — R32 Unspecified urinary incontinence: Secondary | ICD-10-CM | POA: Diagnosis not present

## 2019-10-11 NOTE — Assessment & Plan Note (Addendum)
  Patient's blood pressure during this visit is 162/82. He states his blood pressure at home has ranged 110-190/60-100.  Patient is supposed to be taking spironolactone 50 mg daily, losartan 100 mg daily, carvedilol 25 mg twice daily.  He states that he has been missing some of his medication occasionally.  Patient is to continue working with CCM on medication adherence.  We will not increase or change any blood pressure medication at this time.

## 2019-10-11 NOTE — Progress Notes (Signed)
Internal Medicine Clinic Resident  I have personally reviewed this encounter including the documentation in this note and/or discussed this patient with the care management provider. I will address any urgent items identified by the care management provider and will communicate my actions to the patient's PCP. I have reviewed the patient's CCM visit with my supervising attending, Dr Narendra.  Vahini [name], MD 10/11/2019    

## 2019-10-11 NOTE — Assessment & Plan Note (Signed)
Patient does not have any significant dyspnea, lower extremity edema on exam.  His weight has been stable.  Patient's last echo was done in October 2020 which showed EF 35 to 40%, left ventricular hypokinesis, IVC normal in size.  Assessment and plan Continue carvedilol 25 mg twice daily, Imdur 30 mg daily, losartan 100 mg daily, spironolactone 50 mg daily, torsemide 60 mg twice daily.  Patient is to follow-up with cardiology.  He may benefit from ICD placement due to his low EF.

## 2019-10-11 NOTE — Assessment & Plan Note (Deleted)
Patient does not have any significant dyspnea, lower extremity edema on exam.  His weight has been stable.  Patient's last echo was done in October 2020 which showed EF 35 to 40%, left ventricular hypokinesis, IVC normal in size.  Assessment and plan Continue carvedilol 25 mg twice daily, Imdur 30 mg daily, losartan 100 mg daily, spironolactone 50 mg daily, torsemide 60 mg twice daily.  Patient is to follow-up with cardiology.  He may benefit from ICD placement due to his low EF.

## 2019-10-11 NOTE — Progress Notes (Signed)
Internal Medicine Clinic Attending  CCM services provided by the care management provider and their documentation were discussed with Dr. [name]. We reviewed the pertinent findings, urgent action items addressed by the resident and non-urgent items to be addressed by the PCP.  I agree with the assessment, diagnosis, and plan of care documented in the CCM and resident's note.  Alisandra Son, MD 10/11/2019  

## 2019-10-11 NOTE — Patient Instructions (Signed)
Visit Information  Goals Addressed            This Visit's Progress   . COMPLETED: "I want to try to get personal care services since I can't afford ALF" (pt-stated)       CARE PLAN ENTRY (see longitudinal plan of care for additional care plan information)  Current Barriers:  . ADL IADL limitations   Clinical Social Work Clinical Goal(s):  [name] Over the next 30 days, patient will work with SW to pursue personal care services.   Interventions: . Personal Care Services have initiated for patient  Patient Self Care Activities:  . Self administers medications as prescribed . Attends all scheduled provider appointments .   Please see past updates related to this goal by clicking on the "Past Updates" button in the selected goal         Patient verbalizes understanding of instructions provided today.   The patient has been provided with contact information for the care management team and has been advised to call with any health related questions or concerns.       Ronn Melena, [place] Coordination Social Worker [place] [phone]

## 2019-10-11 NOTE — Assessment & Plan Note (Signed)
  Patient states that he has been having urinary incontinence since his prostate surgery.  He has noticed that he has been urinating when he coughs or sneezes.  He often has times where he cannot make it to the bathroom.  Assessment and plan Patient's urinary continence is likely secondary to his recent prostate surgery.  Recommended timed toileting.  If this does not work can consider referral to pelvic rehab.

## 2019-10-11 NOTE — Chronic Care Management (AMB) (Signed)
  Care Management   Follow Up Note   10/11/2019 Name: Terry Parks MRN: [id number] DOB: 1956/05/13  Referred by: Terry Mage, MD Reason for referral : Care Coordination (PCS)   Terry Parks is a 64 y.o. year old male who is a primary care patient of [name], Terry Spurr, MD. The care management team was consulted for assistance with care management and care coordination needs.    Review of patient status, including review of consultants reports, relevant laboratory and other test results, and collaboration with appropriate care team members and the patient's provider was performed as part of comprehensive patient evaluation and provision of chronic care management services.    SDOH (Social Determinants of Health) assessments performed: No See Care Plan activities for detailed interventions related to [facility])     Advanced Directives: See Care Plan and Vynca application for related entries.   Goals Addressed            This Visit's Progress   . COMPLETED: "I want to try to get personal care services since I can't afford ALF" (pt-stated)       CARE PLAN ENTRY (see longitudinal plan of care for additional care plan information)  Current Barriers:  . ADL IADL limitations   Clinical Social Work Clinical Goal(s):  [name] Over the next 30 days, patient will work with SW to pursue personal care services.   Interventions: . Personal Care Services have initiated for patient  Patient Self Care Activities:  . Self administers medications as prescribed . Attends all scheduled provider appointments .   Please see past updates related to this goal by clicking on the "Past Updates" button in the selected goal          The patient has been provided with contact information for the care management team and has been advised to call with any health related questions or concerns.    Ronn Melena, [place] Coordination Social Worker [place] [phone]

## 2019-10-11 NOTE — Progress Notes (Signed)
CC: Hypertension  HPI:  [name] is a 64 y.o. with chronic medical history as shown below presents for follow-up of hypertension. Please see problem based charting for evaluation, assessment, and plan.  Fortunately this visit had to be completed without the assistance of EMR due to a downtime procedure.  Past Medical History:  Diagnosis Date  . Abscessed tooth    top back large cavity no pain or drainage, one on bottom  pt pulled tooth 4-5 months ago, right top large hole in tooth  . AKI (acute kidney injury) ([place])   . Allergic rhinitis   . Anemia   . Anxiety   . Asthma   . Atrial fibrillation ([place])   . BPH (benign prostatic hypertrophy)    Massive BPH noted on cystoscopy 1/23/ 2012 by Dr. Risa Grill.  . Cancer [facility])    prostate cancer 2019  . Cardiomyopathy ([place])   . CHF (congestive heart failure) ([place])   . Cough 03/30/2012  . Depression   . Diabetes mellitus 04/08/2008   type 2  . Foley catheter in place 07-05-17 placed  . Headache(784.0)    hx migraines none recent  . Hyperlipemia   . Hypertension   . Hypertensive cardiopathy 03/01/2006   2-D echocardiogram 02/01/2012 showed moderate LVH, mildly to moderately reduced left ventricular systolic function with an estimated ejection fraction of 40-45%, and diffuse hypokinesis.  A nuclear medicine stress study done 01/31/2012 showed no reversible ischemia, a small mid anterior wall fixed defect/infarct, and ejection fraction 42%.      . Neck pain   . Nephrolithiasis 05/29/2010   CT scan of abdomen/pelvis on 05/29/2010 showed an obstructing approximate 1-2 mm calculus at the left UVJ, and an approximate 1-2 mm left lower pole renal calculus.   Patient had continuing severe pain , and an elevation of his serum creatinine to a value of 1.75 on 06/06/2010.  Patient underwent cystoscopy on 06/08/2010 by Dr. Risa Grill, but attempts at retrograde pyelogram and ureteroscopy were unsucc  . Numbness 01/08/2018  . Obstructive sleep apnea  03/06/2008   Sleep study 03/06/08 showed severe OSA/hypopnea syndrome, with successful CPAP titration to 13 CWP using a medium ResMed Mirage Quattro full face mask with heated humidifier.   . Rash 04/17/2014  . Renal calculus 05/29/2010   CT scan of abdomen/pelvis on 05/29/2010 showed an obstructing approximate 1-2 mm calculus at the left UVJ, and an approximate 1-2 mm left lower pole renal calculus.   Patient had continuing severe pain , and an elevation of his serum creatinine to a value of 1.75 on 06/06/2010.  The stone had apparently passed and was not seen on repeat CT 06/08/2010.  . Sleep apnea    haven't use cpap in 2 years  . Tooth pain 10/29/2017  . Urinary straining 11/02/2016   Review of Systems:    Denies any nausea, vomiting, headaches, chest pain  Physical Exam:  There were no vitals filed for this visit.  Physical Exam  Constitutional: He appears well-developed and well-nourished. No distress.  Cardiovascular: Normal rate, regular rhythm and normal heart sounds.  Respiratory: Effort normal and breath sounds normal. No respiratory distress. He has no wheezes.  GI: Soft. Bowel sounds are normal. He exhibits distension. There is no abdominal tenderness.  Musculoskeletal:        General: No edema.  Skin: He is not diaphoretic.  Psychiatric: He has a normal mood and affect. His behavior is normal. Judgment and thought content normal.   Assessment & Plan:  See Encounters Tab for problem based charting.  Patient discussed with Dr. Evette Doffing

## 2019-10-12 DIAGNOSIS — G4733 Obstructive sleep apnea (adult) (pediatric): Secondary | ICD-10-CM | POA: Diagnosis not present

## 2019-10-12 NOTE — Assessment & Plan Note (Signed)
Patient's blood glucose readings were downloaded and they have ranged from 105-391, with majority ranging 202.   -patient counseled on remaining adherent to medication.

## 2019-10-12 NOTE — Progress Notes (Signed)
Internal Medicine Clinic Attending  Case discussed with Dr. [name] at the time of the visit.  We reviewed the resident's history and exam and pertinent patient test results.  I agree with the assessment, diagnosis, and plan of care documented in the resident's note. 

## 2019-10-15 DIAGNOSIS — G4733 Obstructive sleep apnea (adult) (pediatric): Secondary | ICD-10-CM | POA: Diagnosis not present

## 2019-10-22 DIAGNOSIS — G4733 Obstructive sleep apnea (adult) (pediatric): Secondary | ICD-10-CM | POA: Diagnosis not present

## 2019-10-23 DIAGNOSIS — G4733 Obstructive sleep apnea (adult) (pediatric): Secondary | ICD-10-CM | POA: Diagnosis not present

## 2019-10-24 DIAGNOSIS — G4733 Obstructive sleep apnea (adult) (pediatric): Secondary | ICD-10-CM | POA: Diagnosis not present

## 2019-10-25 DIAGNOSIS — G4733 Obstructive sleep apnea (adult) (pediatric): Secondary | ICD-10-CM | POA: Diagnosis not present

## 2019-10-26 DIAGNOSIS — G4733 Obstructive sleep apnea (adult) (pediatric): Secondary | ICD-10-CM | POA: Diagnosis not present

## 2019-10-29 DIAGNOSIS — G4733 Obstructive sleep apnea (adult) (pediatric): Secondary | ICD-10-CM | POA: Diagnosis not present

## 2019-10-30 ENCOUNTER — Ambulatory Visit: Payer: Self-pay | Admitting: *Deleted

## 2019-10-30 ENCOUNTER — Other Ambulatory Visit: Payer: Self-pay | Admitting: Internal Medicine

## 2019-10-30 ENCOUNTER — Telehealth: Payer: Medicaid Other | Admitting: *Deleted

## 2019-10-30 DIAGNOSIS — N183 Chronic kidney disease, stage 3 unspecified: Secondary | ICD-10-CM

## 2019-10-30 DIAGNOSIS — E1142 Type 2 diabetes mellitus with diabetic polyneuropathy: Secondary | ICD-10-CM

## 2019-10-30 DIAGNOSIS — E1169 Type 2 diabetes mellitus with other specified complication: Secondary | ICD-10-CM

## 2019-10-30 DIAGNOSIS — E785 Hyperlipidemia, unspecified: Secondary | ICD-10-CM

## 2019-10-30 DIAGNOSIS — K469 Unspecified abdominal hernia without obstruction or gangrene: Secondary | ICD-10-CM

## 2019-10-30 DIAGNOSIS — K429 Umbilical hernia without obstruction or gangrene: Secondary | ICD-10-CM

## 2019-10-30 DIAGNOSIS — G4733 Obstructive sleep apnea (adult) (pediatric): Secondary | ICD-10-CM | POA: Diagnosis not present

## 2019-10-30 DIAGNOSIS — I5023 Acute on chronic systolic (congestive) heart failure: Secondary | ICD-10-CM

## 2019-10-30 NOTE — Progress Notes (Signed)
Internal Medicine Clinic Attending  CCM services provided by the care management provider and their documentation were discussed with Dr. Masoudi. We reviewed the pertinent findings, urgent action items addressed by the resident and non-urgent items to be addressed by the PCP.  I agree with the assessment, diagnosis, and plan of care documented in the CCM and resident's note.  Toshiko Kemler, MD 10/30/2019 

## 2019-10-30 NOTE — Addendum Note (Signed)
Addended byDewayne Hatch on: 10/30/2019 04:11 PM   Modules accepted: Orders

## 2019-10-30 NOTE — Progress Notes (Addendum)
Internal Medicine Clinic Resident  I have personally reviewed this encounter including the documentation in this note and/or discussed this patient with the care management provider. I will address any urgent items identified by the care management provider and will communicate my actions to the patient's PCP. I have reviewed the patient's CCM visit with my supervising attending, Dr Philipp Ovens. Placed future order for lipid panel and urine microalb and surgery referral for abdominal hernia.   Dewayne Hatch, MD 10/30/2019

## 2019-10-30 NOTE — Patient Instructions (Signed)
Visit Information I will ask your provider about referring you to a general surgeon.  Goals Addressed              This Visit's Progress     Patient Stated   .  "I want to get my hernia fixed but I need a referral to a surgeon." (pt-stated)        [place] (see longitudinal plan of care for additional care plan information)  Current Barriers:  [name] Knowledge Deficits related to securing referral to general surgeon - patient states he is doing well with managing his HF, DM and is requesting a referral to a general surgeon so he can be evaluated for hernia repair.   Nurse Case Manager Clinical Goal(s):  [name] Over the next 7-14 days, patient will work with CCM RN and  provider to address needs related to general surgeon consult for hernia repair  Interventions:  . Inter-disciplinary care team collaboration (see longitudinal plan of care) . Collaborated with provider regarding patient's request for general surgery consult  Patient Self Care Activities:  . Patient verbalizes understanding of plan to work with health care team to secure general surgeon consult.  . Unable to independently secure referral to general surgeon  Initial goal documentation        The patient verbalized understanding of instructions provided today and declined a print copy of patient instruction materials.   The care management team will reach out to the patient again over the next 7-14 days.   [name] RN, CCM, [facility] RN Care Manager [phone]

## 2019-10-30 NOTE — Chronic Care Management (AMB) (Signed)
  Care Management   Follow Up Note   10/30/2019 Name: Terry Parks MRN: [id number] DOB: 08/02/1955  Referred by: Terry Mage, MD Reason for referral : DM, HF, HTN   Terry Parks is a 64 y.o. year old male who is a primary care patient of [name], [name], MD. The care management team was consulted for assistance with care management and care coordination needs.    Review of patient status, including review of consultants reports, relevant laboratory and other test results, and collaboration with appropriate care team members and the patient's provider was performed as part of comprehensive patient evaluation and provision of chronic care management services.    SDOH (Social Determinants of Health) assessments performed: No See Care Plan activities for detailed interventions related to [facility])     Advanced Directives: See Care Plan and Vynca application for related entries.   Lab Results  Component Value Date   HGBA1C 12.7 (A) 07/12/2019   HGBA1C 7.4 (A) 01/31/2019   HGBA1C 10.3 (A) 10/13/2018   Lab Results  Component Value Date   CHOL 101 01/09/2018   HDL 26 (L) 01/09/2018   LDLCALC 64 01/09/2018   TRIG 57 01/09/2018   CHOLHDL 3.9 01/09/2018    please update lipid panel, urine for protein and Hgb A1C   Goals Addressed              This Visit's Progress     Patient Stated   .  "I want to get my hernia fixed but I need a referral to a surgeon." (pt-stated)        [name] (see longitudinal plan of care for additional care plan information)  Current Barriers:  [name] Knowledge Deficits related to securing referral to general surgeon - patient states he is doing well with managing his HF, DM and is requesting a referral to a general surgeon so he can be evaluated for hernia repair.   Nurse Case Manager Clinical Goal(s):  [name] Over the next 7-14 days, patient will work with CCM RN and  provider to address needs related to general surgeon consult for hernia  repair  Interventions:  . Inter-disciplinary care team collaboration (see longitudinal plan of care) . Collaborated with provider regarding patient's request for general surgery consult  Patient Self Care Activities:  . Patient verbalizes understanding of plan to work with health care team to secure general surgeon consult.  . Unable to independently secure referral to general surgeon  Initial goal documentation         The care management team will reach out to the patient again over the next 7-14 days.   [name] RN, CCM, [facility] RN Care Manager [phone]

## 2019-10-31 DIAGNOSIS — G4733 Obstructive sleep apnea (adult) (pediatric): Secondary | ICD-10-CM | POA: Diagnosis not present

## 2019-11-01 DIAGNOSIS — G4733 Obstructive sleep apnea (adult) (pediatric): Secondary | ICD-10-CM | POA: Diagnosis not present

## 2019-11-02 ENCOUNTER — Encounter: Payer: Self-pay | Admitting: *Deleted

## 2019-11-02 DIAGNOSIS — G4733 Obstructive sleep apnea (adult) (pediatric): Secondary | ICD-10-CM | POA: Diagnosis not present

## 2019-11-05 ENCOUNTER — Telehealth: Payer: Self-pay | Admitting: *Deleted

## 2019-11-05 DIAGNOSIS — G4733 Obstructive sleep apnea (adult) (pediatric): Secondary | ICD-10-CM | POA: Diagnosis not present

## 2019-11-05 NOTE — Telephone Encounter (Signed)
Received fax from [place] at [employer] requesting diabetic shoes for patient. CMN and LMN placed in PCP's box for completion/signatures. Medical and Functional Status must be filled in completely, 19-30. Will fax this along with OV notes from 10/11/2019 to [phone]. Hubbard Hartshorn, BSN, RN-BC

## 2019-11-06 DIAGNOSIS — E1142 Type 2 diabetes mellitus with diabetic polyneuropathy: Secondary | ICD-10-CM | POA: Diagnosis not present

## 2019-11-06 DIAGNOSIS — G4733 Obstructive sleep apnea (adult) (pediatric): Secondary | ICD-10-CM | POA: Diagnosis not present

## 2019-11-07 ENCOUNTER — Ambulatory Visit: Payer: Self-pay

## 2019-11-07 DIAGNOSIS — G4733 Obstructive sleep apnea (adult) (pediatric): Secondary | ICD-10-CM | POA: Diagnosis not present

## 2019-11-07 DIAGNOSIS — I5042 Chronic combined systolic (congestive) and diastolic (congestive) heart failure: Secondary | ICD-10-CM

## 2019-11-07 NOTE — Chronic Care Management (AMB) (Signed)
  Care Management   Social Work Note  11/07/2019 Name: Vimal Derego MRN: [id number] DOB: [birth year]  Received message from Legrand Pitts with [facility] that patient contacted them requesting transportation to dental appointment on 11/08/19.  Patient has been in need of dental work for quite some time.  Collaborated with Environmental education officer, Susette Racer, to determine if [place] will cover cost of this transport.   Messaged Ms. Sharen Counter back to confirm that [facility] will cover cost of ride and provided cost center.     Ronn Melena, [place] Coordination Social Worker [place] [phone]

## 2019-11-07 NOTE — Progress Notes (Signed)
Internal Medicine Clinic Resident  I have personally reviewed this encounter including the documentation in this note and/or discussed this patient with the care management provider. I will address any urgent items identified by the care management provider and will communicate my actions to the patient's PCP. I have reviewed the patient's CCM visit with my supervising attending, Dr Guilloud.  Gill Delrossi, MD 11/07/2019    

## 2019-11-08 DIAGNOSIS — G4733 Obstructive sleep apnea (adult) (pediatric): Secondary | ICD-10-CM | POA: Diagnosis not present

## 2019-11-08 MED FILL — AMOXICILLIN 500 MG CAPSULE: 500 | 7 days supply | Qty: 28 | Fill #0

## 2019-11-09 NOTE — Progress Notes (Signed)
Internal Medicine Clinic Attending  CCM services provided by the care management provider and their documentation were discussed with Dr. Basaraba. We reviewed the pertinent findings, urgent action items addressed by the resident and non-urgent items to be addressed by the PCP.  I agree with the assessment, diagnosis, and plan of care documented in the CCM and resident's note.  Vance Hochmuth, MD 11/09/2019 

## 2019-11-12 DIAGNOSIS — R32 Unspecified urinary incontinence: Secondary | ICD-10-CM | POA: Diagnosis not present

## 2019-11-16 NOTE — Telephone Encounter (Signed)
Completed and signed CMN/LMN for diabetic shoes, along with OV notes from 5/27 faxed to [facility] at [employer] at [phone]. Fax confirmation receipt received. Hubbard Hartshorn, BSN, RN-BC

## 2019-11-21 MED FILL — ACETAMINOPHEN-COD #3 TABLET: 300-30 | 2 days supply | Qty: 8 | Fill #0

## 2019-11-21 MED FILL — AMOXICILLIN 500 MG CAPSULE: 500 | 7 days supply | Qty: 28 | Fill #0

## 2019-11-22 MED FILL — ACCU-CHEK GUIDE TEST STRIP: 33 days supply | Qty: 100 | Fill #1

## 2019-12-05 ENCOUNTER — Other Ambulatory Visit: Payer: Self-pay | Admitting: Internal Medicine

## 2019-12-05 ENCOUNTER — Other Ambulatory Visit: Payer: Self-pay | Admitting: Cardiovascular Disease

## 2019-12-05 ENCOUNTER — Ambulatory Visit: Payer: Medicaid Other | Admitting: *Deleted

## 2019-12-05 DIAGNOSIS — I4819 Other persistent atrial fibrillation: Secondary | ICD-10-CM

## 2019-12-05 DIAGNOSIS — N183 Chronic kidney disease, stage 3 unspecified: Secondary | ICD-10-CM

## 2019-12-05 DIAGNOSIS — I5042 Chronic combined systolic (congestive) and diastolic (congestive) heart failure: Secondary | ICD-10-CM

## 2019-12-05 DIAGNOSIS — E1159 Type 2 diabetes mellitus with other circulatory complications: Secondary | ICD-10-CM

## 2019-12-05 DIAGNOSIS — E1142 Type 2 diabetes mellitus with diabetic polyneuropathy: Secondary | ICD-10-CM

## 2019-12-05 MED ORDER — SPIRONOLACTONE 50 MG PO TABS: 50.0000 mg | ORAL_TABLET | Freq: Every day | ORAL | 0 refills | Status: DC

## 2019-12-05 MED FILL — AMIODARONE HCL 200 MG TAB: 200 | 30 days supply | Qty: 30 | Fill #0

## 2019-12-05 MED FILL — CARVEDILOL 25 MG TABLET: 25 | 90 days supply | Qty: 180 | Fill #1

## 2019-12-05 MED FILL — XARELTO 20 MG TABLET: 20 | 90 days supply | Qty: 90 | Fill #0

## 2019-12-05 MED FILL — METFORMIN HCL ER 500 MG TB2: 500 | 30 days supply | Qty: 120 | Fill #1

## 2019-12-05 MED FILL — LOSARTAN POTASSIUM 100 MG T: 100 | 90 days supply | Qty: 90 | Fill #0

## 2019-12-05 MED FILL — ACCU-CHEK FASTCLIX LANCETS: 33 days supply | Qty: 102 | Fill #1

## 2019-12-05 MED FILL — XULTOPHY 100 UNIT-3.6MG/ML: 100-3.6 | 90 days supply | Qty: 45 | Fill #0

## 2019-12-05 MED FILL — OMEPRAZOLE 40 MG CPDR: 40 | 30 days supply | Qty: 60 | Fill #1

## 2019-12-05 MED FILL — SPIRONOLACTONE 50 MG TABS: 50 | 30 days supply | Qty: 30 | Fill #0

## 2019-12-05 NOTE — Progress Notes (Signed)
Internal Medicine Clinic Resident  I have personally reviewed this encounter including the documentation in this note and/or discussed this patient with the care management provider. I will address any urgent items identified by the care management provider and will communicate my actions to the patient's PCP. I have reviewed the patient's CCM visit with my supervising attending, Dr Hoffman.  Terry Parks D Terry Carchi, DO 12/05/2019    

## 2019-12-05 NOTE — Patient Instructions (Signed)
Visit Information It was nice speaking with you today. Please call the clinic and make a follow up appointment.  Goals Addressed              This Visit's Progress     Patient Stated   .  " I am doing much better now, I'm losing weight and I really like the PCS worker that came to my house today." (pt-stated)        [place] (see longitudinal plan of care for additional care plan information)  Current Barriers:  . Chronic Disease Management support, education, and care coordination needs related to HF, DM and HTN- patient reports he continues to  record his BP, Weight, pulse daily in his phone, he voices the correct parameters that require provider notification, he says he is pleased with his current PCS worker and that he receives services 5 times weekly  Clinical Goal(s) related to HF, DM and HTN: Over the next 30 - 60 days, patient will:  . Work with the care management team to address educational, disease management, and care coordination needs  . Begin or continue self health monitoring activities as directed today Measure and record CBG (blood glucose) at least one time daily and Measure and record blood pressure one time daily . Call provider office for new or worsened signs and symptoms Blood glucose findings outside established parameters, Blood pressure findings outside established parameters, Chest pain, Shortness of breath, and New or worsened symptom related to HF and DM II  and HTN . Call care management team with questions or concerns . Verbalize basic understanding of patient centered plan of care established today  Interventions related to HF, DM and HTN:  . Evaluation of current treatment plans and patient's adherence to plan as established by provider . Assessed patient understanding of disease states . Assessed patient's education and care coordination needs . Provided disease specific education to patient  . Collaborated with appropriate clinical care team  members regarding patient needs . Positive reinforcement provide to patient for taking daily vitals and recording them in his phone and taking responsibility for his health  Patient Self Care Activities related to HF, DM and HTN:  . Patient is unable to independently self-manage chronic health conditions . Weighs daily and record (notifying MD of 3 lb weight gain over night or 5 lb in a week) . Takes Heart Failure Medications as prescribed . Verbalizes understanding of and follows CHF Action Plan . Adheres to low sodium diet  Please see past updates related to this goal by clicking on the "Past Updates" button in the selected goal      .  "I want to get my hernia fixed but I need a referral to a surgeon." (pt-stated)        [place] (see longitudinal plan of care for additional care plan information)  Current Barriers:  [name] Knowledge Deficits related to securing referral to general surgeon - patient states he never received a call from the surgeon's office   Nurse Case Manager Clinical Goal(s):  [name] Over the next 30-60 days, patient will work with CCM RN and  provider to address needs related to general surgeon consult for hernia repair  Interventions:  . Inter-disciplinary care team collaboration (see longitudinal plan of care) . Advised patient a referral was sent to [facility] on 10/30/19 and a follow up message will be sent to [facility] clinic staff in regards to the referral   Patient Self  Care Activities:  . Patient verbalizes understanding of plan to work with health care team to secure general surgeon consult.  . Unable to independently secure referral to general surgeon  Please see past updates related to this goal by clicking on the "Past Updates" button in the selected goal      .  "My doctor was supposed to order diabetic socks and a waist band for my hernia" (pt-stated)        [place] (see longitudinal plan of care for additional care plan  information)  Current Barriers:  . Need for DME - pt states he needs the information on area medical supply stores so he can purchase an abdominal binder and diabetic socks but says he went to [facility] and they did not have them  Clinical Social Work Clinical Goal(s):  [name] Over the next 60 days, patient will work with CCM RN to address concerns related to DME  Interventions: . Re-educated patient that the DME items he needs do not require a Rx and can be purchased at a DME retail store. At patient request, mailed the name and locations of DME retail stores in [place]  Patient Self Care Activities:  . Self administers medications as prescribed . Attends all scheduled provider appointments .  Please see past updates related to this goal by clicking on the "Past Updates" button in the selected goal      .  My blood pressure is good but my blood sugar is running high and I don't know why." (pt-stated)         CARE PLAN ENTRY (see longitudinal plan of care for additional care plan information)  Objective:  Lab Results  Component Value Date   HGBA1C 12.7 (A) 07/12/2019 .   Lab Results  Component Value Date   CREATININE 1.33 (H) 08/23/2019   CREATININE 1.57 (H) 08/14/2019   CREATININE 1.58 (H) 08/13/2019 .   [name] No results found for: EGFR  Current Barriers:  [name] Knowledge Deficits related to basic Diabetes pathophysiology and self care/management . Knowledge Deficits related to medications used for management of diabetes- patient reports blood sugars of 330 plus for  last few days, says his home blood pressure readings and home weights are meeting target. Patient says he has not taken his "shot" for a few days.  Case Manager Clinical Goal(s):  Over the next 30-60  days, patient will demonstrate improved adherence to prescribed treatment plan for diabetes self care/management as evidenced by:  . daily monitoring and recording of CBG  . adherence to ADA/ carb modified diet . adherence  to prescribed medication regimen  Interventions:  . Provided education to patient about basic DM disease process . Reviewed medications, focusing on Xultophy,  with patient and discussed importance of medication adherence . Discussed plans with patient for ongoing care management follow up and provided patient with direct contact information for care management team . Reviewed scheduled/upcoming provider appointments including: appointments with Gi and cardiology . Encouraged patient to make follow up clinic appointment as his follow up is due in August . Advised patient, providing education and rationale, to check CBG at least daily and record, calling provider and/or CCM RN for findings outside established parameters.   . Review of patient status, including review of consultants reports, relevant laboratory and other test results, and medications completed.  Patient Self Care Activities:  . UNABLE to independently self manage DM . Self administers oral medications as prescribed . Self administers insulin as prescribed .  Self administers injectable DM medication Xultophy as prescribed . Attends all scheduled provider appointments . Checks blood sugars as prescribed and utilize hyper and hypoglycemia protocol as needed . Adheres to prescribed ADA/carb modified  Initial goal documentation            The patient verbalized understanding of instructions provided today and declined a print copy of patient instruction materials.   The care management team will reach out to the patient again over the next 30-60 days.   [name] RN, CCM, [facility] RN Care Manager [phone]

## 2019-12-05 NOTE — Telephone Encounter (Signed)
Rx has been sent to the pharmacy electronically. ° °

## 2019-12-05 NOTE — Chronic Care Management (AMB) (Signed)
Care Management   Follow Up Note   12/05/2019 Name: Terry Parks MRN: [id number] DOB: 07/12/1955  Referred by: [name], MD Reason for referral : Care Coordination (HF, DM, HTN)   Terry Parks is a 64 y.o. year old male who is a primary care patient of [name], MD. The care management team was consulted for assistance with care management and care coordination needs.    Review of patient status, including review of consultants reports, relevant laboratory and other test results, and collaboration with appropriate care team members and the patient's provider was performed as part of comprehensive patient evaluation and provision of chronic care management services.    SDOH (Social Determinants of Health) assessments performed: No See Care Plan activities for detailed interventions related to [facility])     Advanced Directives: See Care Plan and Vynca application for related entries.   Goals Addressed              This Visit's Progress     Patient Stated   .  " I am doing much better now, I'm losing weight and I really like the PCS worker that came to my house today." (pt-stated)        Terry Parks (see longitudinal plan of care for additional care plan information)  Current Barriers:  . Chronic Disease Management support, education, and care coordination needs related to HF, DM and HTN- patient reports he continues to  record his BP, Weight, pulse daily in his phone, he voices the correct parameters that require provider notification, he says he is pleased with his current PCS worker and that he receives services 5 times weekly  Clinical Goal(s) related to HF, DM and HTN: Over the next 30- 6- days, patient will:  . Work with the care management team to address educational, disease management, and care coordination needs  . Begin or continue self health monitoring activities as directed today Measure and record CBG (blood glucose) at least one time daily and  Measure and record blood pressure one time daily . Call provider office for new or worsened signs and symptoms Blood glucose findings outside established parameters, Blood pressure findings outside established parameters, Chest pain, Shortness of breath, and New or worsened symptom related to HF and DM II  and HTN . Call care management team with questions or concerns . Verbalize basic understanding of patient centered plan of care established today  Interventions related to HF, DM and HTN:  . Evaluation of current treatment plans and patient's adherence to plan as established by provider . Assessed patient understanding of disease states . Assessed patient's education and care coordination needs . Provided disease specific education to patient  . Collaborated with appropriate clinical care team members regarding patient needs . Positive reinforcement provide to patient for taking daily vitals and recording them in his phone and taking responsibility for his health  Patient Self Care Activities related to HF, DM and HTN:  . Patient is unable to independently self-manage chronic health conditions . Weighs daily and record (notifying MD of 3 lb weight gain over night or 5 lb in a week) . Takes Heart Failure Medications as prescribed . Verbalizes understanding of and follows CHF Action Plan . Adheres to low sodium diet  Please see past updates related to this goal by clicking on the "Past Updates" button in the selected goal      .  "I want to get my hernia fixed but I need a referral to a surgeon." (  pt-stated)        CARE PLAN ENTRY (see longitudinal plan of care for additional care plan information)  Current Barriers:  Terry Parks Knowledge Deficits related to securing referral to general surgeon - patient states he never received a call from the surgeon's office   Nurse Case Manager Clinical Goal(s):  Terry Parks Over the next 30-60 days, patient will work with CCM RN and  provider to address needs related  to general surgeon consult for hernia repair  Interventions:  . Inter-disciplinary care team collaboration (see longitudinal plan of care) . Advised patient a referral was sent to [facility] on 10/30/19 and a follow up message will be sent to [facility] clinic staff in regards to the referral   Patient Self Care Activities:  . Patient verbalizes understanding of plan to work with health care team to secure general surgeon consult.  . Unable to independently secure referral to general surgeon  Please see past updates related to this goal by clicking on the "Past Updates" button in the selected goal      .  "My doctor was supposed to order diabetic socks and a waist band for my hernia" (pt-stated)        [place] (see longitudinal plan of care for additional care plan information)  Current Barriers:  . Need for DME - pt states he needs the information on area medical supply stores so he can purchase an abdominal binder and diabetic socks but says he went to [facility] and they did not have them  Clinical Social Work Clinical Goal(s):  Terry Parks Over the next 60 days, patient will work with CCM RN to address concerns related to DME  Interventions: . Re-educated patient that the DME items he needs do not require a Rx and can be purchased at a DME retail store. At patient request, mailed the name and locations of DME retail stores in [place]  Patient Self Care Activities:  . Self administers medications as prescribed . Attends all scheduled provider appointments .  Please see past updates related to this goal by clicking on the "Past Updates" button in the selected goal      .  My blood pressure is good but my blood sugar is running high and I don't know why." (pt-stated)         CARE PLAN ENTRY (see longitudinal plan of care for additional care plan information)  Objective:  Lab Results  Component Value Date   HGBA1C 12.7 (A) 07/12/2019 .   Lab Results  Component  Value Date   CREATININE 1.33 (H) 08/23/2019   CREATININE 1.57 (H) 08/14/2019   CREATININE 1.58 (H) 08/13/2019 .   Terry Parks No results found for: EGFR  Current Barriers:  Terry Parks Knowledge Deficits related to basic Diabetes pathophysiology and self care/management . Knowledge Deficits related to medications used for management of diabetes- patient reports blood sugars of 330 plus for  last few days, says his home blood pressure readings and home weights are meeting target. Patient says he has not taken his "shot" for a few days.  Case Manager Clinical Goal(s):  Over the next 30-60  days, patient will demonstrate improved adherence to prescribed treatment plan for diabetes self care/management as evidenced by:  . daily monitoring and recording of CBG  . adherence to ADA/ carb modified diet . adherence to prescribed medication regimen  Interventions:  . Provided education to patient about basic DM disease process . Reviewed medications, focusing on Xultophy,  with patient  and discussed importance of medication adherence . Discussed plans with patient for ongoing care management follow up and provided patient with direct contact information for care management team . Reviewed scheduled/upcoming provider appointments including: appointments with Gi and cardiology . Encouraged patient to make follow up clinic appointment as his follow up is due in August . Advised patient, providing education and rationale, to check CBG at least daily and record, calling provider and/or CCM RN for findings outside established parameters.   . Review of patient status, including review of consultants reports, relevant laboratory and other test results, and medications completed.  Patient Self Care Activities:  . UNABLE to independently self manage DM . Self administers oral medications as prescribed . Self administers insulin as prescribed . Self administers injectable DM medication Xultophy as prescribed . Attends all  scheduled provider appointments . Checks blood sugars as prescribed and utilize hyper and hypoglycemia protocol as needed . Adheres to prescribed ADA/carb modified  Initial goal documentation             The care management team will reach out to the patient again over the next 30-60 days.   [name] RN, CCM, [facility] RN Care Manager [phone]

## 2019-12-07 ENCOUNTER — Telehealth: Payer: Self-pay

## 2019-12-07 NOTE — Telephone Encounter (Signed)
FAX [place] for Out of Network : fax [phone] [place].  Per [place] [facility] will be in network on Sept 1, 2021.

## 2019-12-10 DIAGNOSIS — I10 Essential (primary) hypertension: Secondary | ICD-10-CM | POA: Diagnosis not present

## 2019-12-10 DIAGNOSIS — E119 Type 2 diabetes mellitus without complications: Secondary | ICD-10-CM | POA: Diagnosis not present

## 2019-12-10 DIAGNOSIS — R32 Unspecified urinary incontinence: Secondary | ICD-10-CM | POA: Diagnosis not present

## 2019-12-12 ENCOUNTER — Other Ambulatory Visit: Payer: Self-pay | Admitting: Internal Medicine

## 2019-12-12 ENCOUNTER — Ambulatory Visit (INDEPENDENT_AMBULATORY_CARE_PROVIDER_SITE_OTHER): Payer: Medicaid Other | Admitting: Student

## 2019-12-12 ENCOUNTER — Other Ambulatory Visit: Payer: Self-pay

## 2019-12-12 ENCOUNTER — Encounter: Payer: Self-pay | Admitting: Student

## 2019-12-12 ENCOUNTER — Ambulatory Visit: Payer: Medicaid Other | Admitting: *Deleted

## 2019-12-12 VITALS — BP 134/84 | HR 100 | Temp 97.7°F | Ht 73.0 in | Wt 311.7 lb

## 2019-12-12 DIAGNOSIS — N393 Stress incontinence (female) (male): Secondary | ICD-10-CM | POA: Diagnosis not present

## 2019-12-12 DIAGNOSIS — N183 Chronic kidney disease, stage 3 unspecified: Secondary | ICD-10-CM

## 2019-12-12 DIAGNOSIS — E1142 Type 2 diabetes mellitus with diabetic polyneuropathy: Secondary | ICD-10-CM

## 2019-12-12 DIAGNOSIS — I110 Hypertensive heart disease with heart failure: Secondary | ICD-10-CM | POA: Diagnosis not present

## 2019-12-12 DIAGNOSIS — I4819 Other persistent atrial fibrillation: Secondary | ICD-10-CM

## 2019-12-12 DIAGNOSIS — I5042 Chronic combined systolic (congestive) and diastolic (congestive) heart failure: Secondary | ICD-10-CM

## 2019-12-12 DIAGNOSIS — E1159 Type 2 diabetes mellitus with other circulatory complications: Secondary | ICD-10-CM

## 2019-12-12 DIAGNOSIS — H5461 Unqualified visual loss, right eye, normal vision left eye: Secondary | ICD-10-CM

## 2019-12-12 LAB — POCT GLYCOSYLATED HEMOGLOBIN (HGB A1C): Hemoglobin A1C: 10.1 % — AB (ref 4.0–5.6)

## 2019-12-12 LAB — GLUCOSE, CAPILLARY: Glucose-Capillary: 177 mg/dL — ABNORMAL HIGH (ref 70–99)

## 2019-12-12 MED ORDER — EMPAGLIFLOZIN 10 MG PO TABS: 10.0000 mg | ORAL_TABLET | Freq: Every day | ORAL | 2 refills | Status: DC

## 2019-12-12 MED ORDER — TORSEMIDE 20 MG PO TABS: 40.0000 mg | ORAL_TABLET | Freq: Every day | ORAL | 0 refills | Status: DC

## 2019-12-12 MED ORDER — METFORMIN HCL ER 500 MG PO TB24: 2000.0000 mg | ORAL_TABLET | Freq: Every day | ORAL | 3 refills | Status: DC

## 2019-12-12 MED FILL — JARDIANCE 10 MG TABLET: 10 | 30 days supply | Qty: 30 | Fill #0

## 2019-12-12 NOTE — Assessment & Plan Note (Signed)
Continues to experience urinary incontinence especially with coughing, sneezing, and bending over. He has not tried timed toileting but is willing to try it. Stressed importance of keeping the groin clean and dry to avoid skin infections especially with addition of SGLT-2 inhibitor.

## 2019-12-12 NOTE — Assessment & Plan Note (Addendum)
HA1c 10.1% today improved from 12.7% in 06/2019. Blood glucose is uncontrolled with blood glucose readings ranging from 150-500 with majority above 200. He has been forgetting to use his Xultophy 50 units daily. Has not placed needles together with pens with reminder on table to take his insulin. He also frequently forgets to take his metformin. Stressed importance of medication adherence. Will add Jardiance 10 mg daily for better blood glucose control and cardioprotective effects. Discussed benefits and risk including UTI and skin infections. Patient understands and is agreeable to starting the medication. Will decrease his torsemide to 40 mg daily for now given diuretic effect of Jardiance.    States he has blurred vision for the past 4 months. In 04/2019 he had a fall with orbital fracture and concern extraocular muscle entrapment. He denies pain swelling, and inability to move eye. Blurred vision may be relate to uncontrolled blood glucose. Will need to follow up with ophthalmology.    Plan Start Jardiance 10 mg daily Decrease torsemide to 40 mg daily Follow up with ophthalmology

## 2019-12-12 NOTE — Patient Instructions (Addendum)
It was a pleasure seeing you in clinic.    Please start taking Jardiance 10 mg daily and continue to use you Xultophy 50 units and Metformin 4 tablets a day. Once you start the Jardiance you can reduce the torsemide to 2 tablets in the morning. Please come see [place] soon if you start feeling short of breath, gaining weight, or have more leg swelling after decreasing the torsemide.  We will check you B12 levels to see if you need to restart vitamin B12.  I have made a referral to ophthalmology too follow up on [facility].  If you have any questions or concerns, please call our clinic at [phone] between 9am-5pm and after hours call [phone] and ask for the internal medicine resident on call. If you feel you are having a medical emergency please call 911.   Thank you, we look forward to helping you remain healthy!

## 2019-12-12 NOTE — Assessment & Plan Note (Signed)
Neuropathic pain has not improved with increasing dose of Gabapentin. States he often feels sleep with increased dose. Does not appear that he is on duloxetine at this time. States his pain improved with compression stockings and wishes to continue with this. Will discontinue Gabapentin at this time. He is on metformin with last B12 level in 2019 which was normal. Will recheck today as low B12 may contribute to neuropathy.[name]

## 2019-12-12 NOTE — Patient Instructions (Signed)
Visit Information It was good seeing you today. I am so glad you feel so much better.  Goals Addressed              This Visit's Progress     Patient Stated   .  " I am doing much better now, I'm losing weight and I really like the PCS worker that came to my house today." (pt-stated)        [place] (see longitudinal plan of care for additional care plan information)  Current Barriers:  . Chronic Disease Management support, education, and care coordination needs related to HF, DM and HTN- patient reports he continues to  record his BP, Weight, pulse daily in his phone, he voices the correct parameters that require provider notification, he says he is pleased with his current PCS worker and that he receives services 5 times weekly  Clinical Goal(s) related to HF, DM and HTN: Over the next 30 - 60 days, patient will:  . Work with the care management team to address educational, disease management, and care coordination needs  . Begin or continue self health monitoring activities as directed today Measure and record CBG (blood glucose) at least one time daily and Measure and record blood pressure one time daily . Call provider office for new or worsened signs and symptoms Blood glucose findings outside established parameters, Blood pressure findings outside established parameters, Chest pain, Shortness of breath, and New or worsened symptom related to HF and DM II  and HTN . Call care management team with questions or concerns . Verbalize basic understanding of patient centered plan of care established today  Interventions related to HF, DM and HTN:  . Evaluation of current treatment plans and patient's adherence to plan as established by provider . Assessed patient understanding of disease states . Assessed patient's education and care coordination needs . Provided disease specific education to patient  . Collaborated with appropriate clinical care team members regarding patient  needs . Positive reinforcement provide to patient for taking daily vitals and recording them in his phone and taking responsibility for his health . Met with patient during clinic visit to answer any chronic disease self management questions  Patient Self Care Activities related to HF, DM and HTN:  . Patient is unable to independently self-manage chronic health conditions . Weighs daily and record (notifying MD of 3 lb weight gain over night or 5 lb in a week) . Takes Heart Failure Medications as prescribed . Verbalizes understanding of and follows CHF Action Plan . Adheres to low sodium diet  Please see past updates related to this goal by clicking on the "Past Updates" button in the selected goal      .  COMPLETED: " the gabapentin does not help my leg pain, it's unbearable at times and I can't care for myself I hurt so bad' (pt-stated)        [place] (see longitudinal plan of care for additional care plan information)  Current Barriers:  [name] Knowledge Deficits related to pain management- patient reports his PCS worker rubbed some highly recommended liniment cream on his legs today and he hopes it helps the pain-met with patient during clinic visit, states neuropathic pain is much better and he is now able to walk for pleasure  Nurse Case Manager Clinical Goal(s):  [name] Over the next 30 days, patient will verbalizes or demonstrate relief or control of pain and demonstrate increased ability for self care   Interventions:  .  Met with patient during clinic visit to discuss pain management  Patient Self Care Activities:  . Patient verbalizes understanding of plan to discuss pain with provider . Unable to self administer medications as prescribed . Unable to perform ADLs independently . Unable to perform IADLs independently  Please see past updates related to this goal by clicking on the "Past Updates" button in the selected goal      .  COMPLETED: "I want to get my hernia fixed  but I need a referral to a surgeon." (pt-stated)        [place] (see longitudinal plan of care for additional care plan information)  Current Barriers:  [name] Knowledge Deficits related to securing referral to general surgeon - patient states he never received a call from the surgeon's office - patient states he has an appointment to meet with the surgeon to discuss hernia repair, he says he still plans to purchase an abdominal binder   Nurse Case Manager Clinical Goal(s):  [name] Over the next 30-60 days, patient will work with CCM RN and  provider to address needs related to general surgeon consult for hernia repair  Interventions:  . Inter-disciplinary care team collaboration (see longitudinal plan of care) . Advised patient a referral was sent to [facility] on 10/30/19 and a follow up message will be sent to [facility] clinic staff in regards to the referral . 12/12/19- met with patient during clinic visit to discuss status of referral to surgeon   Patient Self Care Activities:  . Patient verbalizes understanding of plan to work with health care team to secure general surgeon consult.  . Unable to independently secure referral to general surgeon  Please see past updates related to this goal by clicking on the "Past Updates" button in the selected goal      .  COMPLETED: "My doctor was supposed to order diabetic socks and a waist band for my hernia" (pt-stated)        [place] (see longitudinal plan of care for additional care plan information)  Current Barriers:  . Need for DME - pt states he still wants to purchase ana abdominal binder and diabetic socks  Clinical Social Work Clinical Goal(s):  [name] Over the next 60 days, patient will work with CCM RN to address concerns related to DME  Interventions: . Re-educated patient that the DME items he needs do not require a Rx and can be purchased at a DME retail store. At patient request, mailed the name and locations  of DME retail stores in [place] . 12/12/19- met with patient in clinic and provided him with list or area medical supply retail stores  Patient Self Care Activities:  . Self administers medications as prescribed . Attends all scheduled provider appointments .  Please see past updates related to this goal by clicking on the "Past Updates" button in the selected goal      .  My blood pressure is good but my blood sugar is running high and I don't know why." (pt-stated)         CARE PLAN ENTRY (see longitudinal plan of care for additional care plan information)  Objective:  Lab Results  Component Value Date   HGBA1C 10.1 (A) 12/12/2019   HGBA1C 12.7 (A) 07/12/2019   HGBA1C 7.4 (A) 01/31/2019   Lab Results  Component Value Date   MICROALBUR 11.3 (H) 04/17/2014   LDLCALC 64 01/09/2018   CREATININE 1.33 (H) 08/23/2019    .  [name]  No results found for: EGFR  Current Barriers:  [name] Knowledge Deficits related to basic Diabetes pathophysiology and self care/management . Knowledge Deficits related to medications used for management of diabetes- patient reports blood sugars of 330 plus for  last few days, says his home blood pressure readings and home weights are meeting target. Patient says he has not taken his "shot" for a few days.  Case Manager Clinical Goal(s):  Over the next 30-60  days, patient will demonstrate improved adherence to prescribed treatment plan for diabetes self care/management as evidenced by:  . daily monitoring and recording of CBG  . adherence to ADA/ carb modified diet . adherence to prescribed medication regimen  Interventions:  . Provided education to patient about basic DM disease process . Reviewed medications, focusing on Xultophy,  with patient and discussed importance of medication adherence . Discussed plans with patient for ongoing care management follow up and provided patient with direct contact information for care management team . Reviewed  scheduled/upcoming provider appointments including: appointments with Gi and cardiology . Encouraged patient to make follow up clinic appointment as his follow up is due in August . Advised patient, providing education and rationale, to check CBG at least daily and record, calling provider and/or CCM RN for findings outside established parameters.   . Review of patient status, including review of consultants reports, relevant laboratory and other test results, and medications completed. . 12/12/19- Met with patient during clinic visit to ask if he had any questions related to diabetes self management    Patient Self Care Activities:  . UNABLE to independently self manage DM . Self administers oral medications as prescribed . Self administers insulin as prescribed . Self administers injectable DM medication Xultophy as prescribed . Attends all scheduled provider appointments . Checks blood sugars as prescribed and utilize hyper and hypoglycemia protocol as needed . Adheres to prescribed ADA/carb modified  Please see past updates related to this goal by clicking on the "Past Updates" button in the selected goal            Other   .  Blood Pressure < 130/80           BP Readings from Last 3 Encounters:  12/12/19 (!) 134/84  09/13/19 118/78  08/23/19 (!) 169/99     Met with patient during clinic visit and asked if he had any questions related to previously discussed HTN self management strategies: . Take medications as prescribed . Monitor your blood pressure at home- patient states he is currently using a wrist monitor that a friend loaned him as the cuff on his arm machine is too small and the machine is too awkward to use . Follow a low salt diet- limit consumption of fast foods, packaged foods . Get regular exercise . Maintain a healthy weight . Manage stress      .  HEMOGLOBIN A1C < 7        Lab Results  Component Value Date   HGBA1C 10.1 (A) 12/12/2019   HGBA1C 12.7 (A)  07/12/2019   HGBA1C 7.4 (A) 01/31/2019   Lab Results  Component Value Date   MICROALBUR 11.3 (H) 04/17/2014   LDLCALC 64 01/09/2018   CREATININE 1.33 (H) 08/23/2019      Met with patient during clinic visit and asked if he had any questions related to previously discussed diabetes self management actions:  Glucose monitoring per provider recommendations patient reports fasting CBG today is 178- says he had sherbet as bedtime snack  Take medications as directed- patient states he is taking medications as prescribed  Perform Quality checks on blood meter if suspicious of readings  Eat Healthy- Reviewed plate method - patient states he has stopped drinking sweetened juices but does have a sherbet bedtime snack occasionally, counseled on portion control and label reading to include limiting foods/drinks with more than 10 gm of sugar per serving, mailed patient Emmi information on how to read new food label on 07/17/19  Check feet daily- notify MD for non healing sores- patient states he checks feet daily  Visit provider every 3-6 months as directed  Hbg A1C level every 3-6 months.  Eye Exam yearly- reminded patient he is overdue for eye exam  Mailed patient Emmi education articles on 07/18/19 on how to prevent high blood sugar emergencies in diabetics    .  LDL CALC < 100        Lipid profile drawn today 12/12/19- results not yet available       The patient verbalized understanding of instructions provided today and declined a print copy of patient instruction materials.   The care management team will reach out to the patient again over the next 30-60 days.   [name] RN, CCM, [facility] RN Care Manager [phone]

## 2019-12-12 NOTE — Assessment & Plan Note (Signed)
Continues to be doing well on current medications and denies dyspnea. Weight is stable at 311 lbs today. Lung clear to ascultation and without significant lower extremity edema. Will decrease his torsemide to 40mg  once a day and start on Jardiance. He will monitor worsening dyspnea, weight gain, and lower extremity edema.   -Decrease to Torsemide 40 mg in the morning. -Continue carvedilol 25 mg BID -Continue Imdur 30 mg daily -Continue Losartan 100 mg daily -Continue spironolactone 50 mg daily

## 2019-12-12 NOTE — Assessment & Plan Note (Addendum)
BP today is 134/84 at goal of <140/90 State he has been taking medications for blood pressure regularly. Appears well controlled with current medications.  Continue spironolactone 50 mg daily, Losartan 100 mg daily, and carvedilol 25 mg daily.

## 2019-12-12 NOTE — Telephone Encounter (Signed)
[employer] don't need PA  they have 60 days until the practice  is affiliated  and [place] from [place] is aware of that

## 2019-12-12 NOTE — Chronic Care Management (AMB) (Signed)
Chronic Care Management   Follow Up Note   12/12/2019 Name: Terry Parks MRN: [id number] DOB: [birth year]  Referred by: [name], MD Reason for referral : Care Coordination (IDDM, HTN)   Terry Parks is a 64 y.o. year old male who is a primary care patient of [name], MD. The CCM team was consulted for assistance with chronic disease management and care coordination needs.    Review of patient status, including review of consultants reports, relevant laboratory and other test results, and collaboration with appropriate care team members and the patient's provider was performed as part of comprehensive patient evaluation and provision of chronic care management services.    SDOH (Social Determinants of Health) assessments performed: No See Care Plan activities for detailed interventions related to [facility])     Outpatient Encounter Medications as of 12/12/2019  Medication Sig  . Accu-Chek Softclix Lancets lancets Check blood sugar three times a day as instructed  . acetaminophen (TYLENOL) 325 MG tablet Take 325-650 mg by mouth every 6 (six) hours as needed (for pain).  [name] amiodarone (PACERONE) 200 MG tablet TAKE 1 TABLET BY MOUTH DAILY.  . bisacodyl (DULCOLAX) 5 MG EC tablet Take 5 mg by mouth daily as needed for moderate constipation.  . Blood Glucose Monitoring Suppl (ACCU-CHEK AVIVA) device Use as instructed to check blood sugar up to 3 times a day  . carvedilol (COREG) 25 MG tablet Take 1 tablet (25 mg total) by mouth 2 (two) times daily with a meal.  . empagliflozin (JARDIANCE) 10 MG TABS tablet Take 1 tablet (10 mg total) by mouth daily before breakfast.  . 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 insulin dependent  . Insulin Degludec-Liraglutide (XULTOPHY) 100-3.6 UNIT-MG/ML SOPN Inject 50 Units into the skin daily.  . Insulin Pen Needle (CARETOUCH PEN NEEDLES) 31G X 6 MM MISC 1 pen by Does not apply route  at bedtime.  . isosorbide mononitrate (IMDUR) 30 MG 24 hr tablet Take 1 tablet (30 mg total) by mouth daily.  [name] losartan (COZAAR) 100 MG tablet Take 1 tablet (100 mg total) by mouth daily.  . metFORMIN (GLUCOPHAGE XR) 500 MG 24 hr tablet Take 4 tablets (2,000 mg total) by mouth daily with breakfast.  . nystatin (NYSTATIN) powder Apply 1 application topically 3 (three) times daily.  [name] omeprazole (PRILOSEC) 40 MG capsule Take 1 capsule (40 mg total) by mouth 2 (two) times daily before a meal.  . rivaroxaban (XARELTO) 20 MG TABS tablet Take 1 tablet (20 mg total) by mouth daily with breakfast.  . spironolactone (ALDACTONE) 50 MG tablet TAKE 1 TABLET (50 MG TOTAL) BY MOUTH DAILY.  [name] spironolactone (ALDACTONE) 50 MG tablet Take 1 tablet (50 mg total) by mouth daily.  [name] torsemide (DEMADEX) 20 MG tablet Take 2 tablets (40 mg total) by mouth daily.   No facility-administered encounter medications on file as of 12/12/2019.     Objective:  Wt Readings from Last 3 Encounters:  12/12/19 (!) 311 lb 11.2 oz (141.4 kg)  09/13/19 (!) 322 lb 3.2 oz (146.1 kg)  08/23/19 (!) 320 lb 11.2 oz (145.5 kg)    Goals Addressed              This Visit's Progress     Patient Stated   .  " I am doing much better now, I'm losing weight and I really like the PCS worker that came to my house today." (pt-stated)  CARE PLAN ENTRY (see longitudinal plan of care for additional care plan information)  Current Barriers:  . Chronic Disease Management support, education, and care coordination needs related to HF, DM and HTN- patient reports he continues to  record his BP, Weight, pulse daily in his phone, he voices the correct parameters that require provider notification, he says he is pleased with his current PCS worker and that he receives services 5 times weekly  Clinical Goal(s) related to HF, DM and HTN: Over the next 30 - 60 days, patient will:  . Work with the care management team to address educational,  disease management, and care coordination needs  . Begin or continue self health monitoring activities as directed today Measure and record CBG (blood glucose) at least one time daily and Measure and record blood pressure one time daily . Call provider office for new or worsened signs and symptoms Blood glucose findings outside established parameters, Blood pressure findings outside established parameters, Chest pain, Shortness of breath, and New or worsened symptom related to HF and DM II  and HTN . Call care management team with questions or concerns . Verbalize basic understanding of patient centered plan of care established today  Interventions related to HF, DM and HTN:  . Evaluation of current treatment plans and patient's adherence to plan as established by provider . Assessed patient understanding of disease states . Assessed patient's education and care coordination needs . Provided disease specific education to patient  . Collaborated with appropriate clinical care team members regarding patient needs . Positive reinforcement provide to patient for taking daily vitals and recording them in his phone and taking responsibility for his health . Met with patient during clinic visit to answer any chronic disease self management questions  Patient Self Care Activities related to HF, DM and HTN:  . Patient is unable to independently self-manage chronic health conditions . Weighs daily and record (notifying MD of 3 lb weight gain over night or 5 lb in a week) . Takes Heart Failure Medications as prescribed . Verbalizes understanding of and follows CHF Action Plan . Adheres to low sodium diet  Please see past updates related to this goal by clicking on the "Past Updates" button in the selected goal      .  COMPLETED: " the gabapentin does not help my leg pain, it's unbearable at times and I can't care for myself I hurt so bad' (pt-stated)        [place] (see longitudinal plan of  care for additional care plan information)  Current Barriers:  [name] Knowledge Deficits related to pain management- patient reports his PCS worker rubbed some highly recommended liniment cream on his legs today and he hopes it helps the pain-met with patient during clinic visit, states neuropathic pain is much better and he is now able to walk for pleasure  Nurse Case Manager Clinical Goal(s):  [name] Over the next 30 days, patient will verbalizes or demonstrate relief or control of pain and demonstrate increased ability for self care   Interventions:  [name] Met with patient during clinic visit to discuss pain management  Patient Self Care Activities:  . Patient verbalizes understanding of plan to discuss pain with provider . Unable to self administer medications as prescribed . Unable to perform ADLs independently . Unable to perform IADLs independently  Please see past updates related to this goal by clicking on the "Past Updates" button in the selected goal      .  COMPLETED: "I want to get my hernia fixed but I need a referral to a surgeon." (pt-stated)        [place] (see longitudinal plan of care for additional care plan information)  Current Barriers:  [name] Knowledge Deficits related to securing referral to general surgeon - patient states he never received a call from the surgeon's office - patient states he has an appointment to meet with the surgeon to discuss hernia repair, he says he still plans to purchase an abdominal binder   Nurse Case Manager Clinical Goal(s):  [name] Over the next 30-60 days, patient will work with CCM RN and  provider to address needs related to general surgeon consult for hernia repair  Interventions:  . Inter-disciplinary care team collaboration (see longitudinal plan of care) . Advised patient a referral was sent to [facility] on 10/30/19 and a follow up message will be sent to [facility] clinic staff in regards to the referral . 12/12/19- met  with patient during clinic visit to discuss status of referral to surgeon   Patient Self Care Activities:  . Patient verbalizes understanding of plan to work with health care team to secure general surgeon consult.  . Unable to independently secure referral to general surgeon  Please see past updates related to this goal by clicking on the "Past Updates" button in the selected goal      .  COMPLETED: "My doctor was supposed to order diabetic socks and a waist band for my hernia" (pt-stated)        [place] (see longitudinal plan of care for additional care plan information)  Current Barriers:  . Need for DME - pt states he still wants to purchase ana abdominal binder and diabetic socks  Clinical Social Work Clinical Goal(s):  [name] Over the next 60 days, patient will work with CCM RN to address concerns related to DME  Interventions: . Re-educated patient that the DME items he needs do not require a Rx and can be purchased at a DME retail store. At patient request, mailed the name and locations of DME retail stores in [place] . 12/12/19- met with patient in clinic and provided him with list or area medical supply retail stores  Patient Self Care Activities:  . Self administers medications as prescribed . Attends all scheduled provider appointments .  Please see past updates related to this goal by clicking on the "Past Updates" button in the selected goal      .  My blood pressure is good but my blood sugar is running high and I don't know why." (pt-stated)         CARE PLAN ENTRY (see longitudinal plan of care for additional care plan information)  Objective:  Lab Results  Component Value Date   HGBA1C 10.1 (A) 12/12/2019   HGBA1C 12.7 (A) 07/12/2019   HGBA1C 7.4 (A) 01/31/2019   Lab Results  Component Value Date   MICROALBUR 11.3 (H) 04/17/2014   LDLCALC 64 01/09/2018   CREATININE 1.33 (H) 08/23/2019    .  [name] No results found for: EGFR  Current Barriers:   [name] Knowledge Deficits related to basic Diabetes pathophysiology and self care/management . Knowledge Deficits related to medications used for management of diabetes- patient reports blood sugars of 330 plus for  last few days, says his home blood pressure readings and home weights are meeting target. Patient says he has not taken his "shot" for a few days.  Case Manager Clinical  Goal(s):  Over the next 30-60  days, patient will demonstrate improved adherence to prescribed treatment plan for diabetes self care/management as evidenced by:  . daily monitoring and recording of CBG  . adherence to ADA/ carb modified diet . adherence to prescribed medication regimen  Interventions:  . Provided education to patient about basic DM disease process . Reviewed medications, focusing on Xultophy,  with patient and discussed importance of medication adherence . Discussed plans with patient for ongoing care management follow up and provided patient with direct contact information for care management team . Reviewed scheduled/upcoming provider appointments including: appointments with Gi and cardiology . Encouraged patient to make follow up clinic appointment as his follow up is due in August . Advised patient, providing education and rationale, to check CBG at least daily and record, calling provider and/or CCM RN for findings outside established parameters.   . Review of patient status, including review of consultants reports, relevant laboratory and other test results, and medications completed. . 12/12/19- Met with patient during clinic visit to ask if he had any questions related to diabetes self management    Patient Self Care Activities:  . UNABLE to independently self manage DM . Self administers oral medications as prescribed . Self administers insulin as prescribed . Self administers injectable DM medication Xultophy as prescribed . Attends all scheduled provider appointments . Checks blood sugars  as prescribed and utilize hyper and hypoglycemia protocol as needed . Adheres to prescribed ADA/carb modified  Please see past updates related to this goal by clicking on the "Past Updates" button in the selected goal            Other   .  Blood Pressure < 130/80           BP Readings from Last 3 Encounters:  12/12/19 (!) 134/84  09/13/19 118/78  08/23/19 (!) 169/99     Met with patient during clinic visit and asked if he had any questions related to previously discussed HTN self management strategies: . Take medications as prescribed . Monitor your blood pressure at home- patient states he is currently using a wrist monitor that a friend loaned him as the cuff on his arm machine is too small and the machine is too awkward to use . Follow a low salt diet- limit consumption of fast foods, packaged foods . Get regular exercise . Maintain a healthy weight . Manage stress      .  HEMOGLOBIN A1C < 7        Lab Results  Component Value Date   HGBA1C 10.1 (A) 12/12/2019   HGBA1C 12.7 (A) 07/12/2019   HGBA1C 7.4 (A) 01/31/2019   Lab Results  Component Value Date   MICROALBUR 11.3 (H) 04/17/2014   LDLCALC 64 01/09/2018   CREATININE 1.33 (H) 08/23/2019      Met with patient during clinic visit and asked if he had any questions related to previously discussed diabetes self management actions:  Glucose monitoring per provider recommendations patient reports fasting CBG today is 178- says he had sherbet as bedtime snack  Take medications as directed- patient states he is taking medications as prescribed  Perform Quality checks on blood meter if suspicious of readings  Eat Healthy- Reviewed plate method - patient states he has stopped drinking sweetened juices but does have a sherbet bedtime snack occasionally, counseled on portion control and label reading to include limiting foods/drinks with more than 10 gm of sugar per serving, mailed patient Emmi information on  how to read  new food label on 07/17/19  Check feet daily- notify MD for non healing sores- patient states he checks feet daily  Visit provider every 3-6 months as directed  Hbg A1C level every 3-6 months.  Eye Exam yearly- reminded patient he is overdue for eye exam  Mailed patient Emmi education articles on 07/18/19 on how to prevent high blood sugar emergencies in diabetics    .  LDL CALC < 100        Lipid profile drawn today 12/12/19- results not yet available        Plan:   The care management team will reach out to the patient again over the next 30-60 days.    [name] RN, CCM, [facility] RN Care Manager [phone]

## 2019-12-12 NOTE — Progress Notes (Signed)
CC: Blurred vision  HPI:  [name] is a 64 y.o. male with history documented below who presents today for blurred vision for the past 4 months. Please refer to problem based charting for further details and assessment and plan of current problem and chronic medical conditions.  Past Medical History:  Diagnosis Date  . Abscessed tooth    top back large cavity no pain or drainage, one on bottom  pt pulled tooth 4-5 months ago, right top large hole in tooth  . AKI (acute kidney injury) ([place])   . Allergic rhinitis   . Anemia   . Anxiety   . Asthma   . Atrial fibrillation ([place])   . BPH (benign prostatic hypertrophy)    Massive BPH noted on cystoscopy 1/23/ 2012 by Dr. Risa Grill.  . Cancer [facility])    prostate cancer 2019  . Cardiomyopathy ([place])   . CHF (congestive heart failure) ([place])   . Cough 03/30/2012  . Depression   . Diabetes mellitus 04/08/2008   type 2  . Foley catheter in place 07-05-17 placed  . Headache(784.0)    hx migraines none recent  . Hyperlipemia   . Hypertension   . Hypertensive cardiopathy 03/01/2006   2-D echocardiogram 02/01/2012 showed moderate LVH, mildly to moderately reduced left ventricular systolic function with an estimated ejection fraction of 40-45%, and diffuse hypokinesis.  A nuclear medicine stress study done 01/31/2012 showed no reversible ischemia, a small mid anterior wall fixed defect/infarct, and ejection fraction 42%.      . Neck pain   . Nephrolithiasis 05/29/2010   CT scan of abdomen/pelvis on 05/29/2010 showed an obstructing approximate 1-2 mm calculus at the left UVJ, and an approximate 1-2 mm left lower pole renal calculus.   Patient had continuing severe pain , and an elevation of his serum creatinine to a value of 1.75 on 06/06/2010.  Patient underwent cystoscopy on 06/08/2010 by Dr. Risa Grill, but attempts at retrograde pyelogram and ureteroscopy were unsucc  . Numbness 01/08/2018  . Obstructive sleep apnea 03/06/2008   Sleep study  03/06/08 showed severe OSA/hypopnea syndrome, with successful CPAP titration to 13 CWP using a medium ResMed Mirage Quattro full face mask with heated humidifier.   . Rash 04/17/2014  . Renal calculus 05/29/2010   CT scan of abdomen/pelvis on 05/29/2010 showed an obstructing approximate 1-2 mm calculus at the left UVJ, and an approximate 1-2 mm left lower pole renal calculus.   Patient had continuing severe pain , and an elevation of his serum creatinine to a value of 1.75 on 06/06/2010.  The stone had apparently passed and was not seen on repeat CT 06/08/2010.  . Sleep apnea    haven't use cpap in 2 years  . Tooth pain 10/29/2017  . Urinary straining 11/02/2016   Review of Systems: Negative as per HPI  Physical Exam:  Vitals:   12/12/19 1531 12/12/19 1541  BP: (!) 172/77 (!) 134/84  Pulse: 104 100  Temp: 97.7 F (36.5 C)   TempSrc: Oral   SpO2: 100%   Weight: (!) 311 lb 11.2 oz (141.4 kg)   Height: 6\' 1"  (1.854 m)    Physical Exam  Constitutional: Appears well-developed and well-nourished. No distress.  HENT:  Head: Normocephalic and atraumatic.  Eyes: Estra ocular movents intact, pupils equal and reactive to light Cardiovascular: Normal rate, regular rhythm and normal heart sounds.  Respiratory: Effort normal and breath sounds normal. No respiratory distress. No wheezes.  GI: Abdominal distension from  hernia. Soft. Bowel  sounds are normal. distension. There is no tenderness.  Musculoskeletal: Normal tone Neurological: Is alert.  Skin: Not diaphoretic. No erythema.  Psychiatric: Normal mood and affect. Behavior is normal. Judgment and thought content normal.    Assessment & Plan:   See Encounters Tab for problem based charting.  Patient seen with Dr. Heber Goodland

## 2019-12-13 ENCOUNTER — Ambulatory Visit (INDEPENDENT_AMBULATORY_CARE_PROVIDER_SITE_OTHER): Payer: Medicaid Other | Admitting: Gastroenterology

## 2019-12-13 ENCOUNTER — Other Ambulatory Visit (INDEPENDENT_AMBULATORY_CARE_PROVIDER_SITE_OTHER): Payer: Medicaid Other

## 2019-12-13 ENCOUNTER — Encounter: Payer: Self-pay | Admitting: Gastroenterology

## 2019-12-13 VITALS — BP 138/80 | HR 84 | Ht 73.0 in | Wt 314.0 lb

## 2019-12-13 DIAGNOSIS — Z862 Personal history of diseases of the blood and blood-forming organs and certain disorders involving the immune mechanism: Secondary | ICD-10-CM

## 2019-12-13 DIAGNOSIS — D649 Anemia, unspecified: Secondary | ICD-10-CM

## 2019-12-13 DIAGNOSIS — Z8719 Personal history of other diseases of the digestive system: Secondary | ICD-10-CM

## 2019-12-13 DIAGNOSIS — K439 Ventral hernia without obstruction or gangrene: Secondary | ICD-10-CM | POA: Diagnosis not present

## 2019-12-13 LAB — LIPID PANEL
Chol/HDL Ratio: 8.4 ratio — ABNORMAL HIGH (ref 0.0–5.0)
Cholesterol, Total: 236 mg/dL — ABNORMAL HIGH (ref 100–199)
HDL: 28 mg/dL — ABNORMAL LOW (ref >39)
LDL Chol Calc (NIH): 174 mg/dL — ABNORMAL HIGH (ref 0–99)
Triglycerides: 182 mg/dL — ABNORMAL HIGH (ref 0–149)
VLDL Cholesterol Cal: 34 mg/dL (ref 5–40)

## 2019-12-13 LAB — VITAMIN B12: Vitamin B-12: 452 pg/mL (ref 232–1245)

## 2019-12-13 LAB — FERRITIN: Ferritin: 255.6 ng/mL (ref 22.0–322.0)

## 2019-12-13 LAB — CBC
HCT: 40 % (ref 39.0–52.0)
Hemoglobin: 13.3 g/dL (ref 13.0–17.0)
MCHC: 33.2 g/dL (ref 30.0–36.0)
MCV: 84.2 fl (ref 78.0–100.0)
Platelets: 203 10*3/uL (ref 150.0–400.0)
RBC: 4.75 Mil/uL (ref 4.22–5.81)
RDW: 13.6 % (ref 11.5–15.5)
WBC: 7.8 10*3/uL (ref 4.0–10.5)

## 2019-12-13 LAB — TRANSFERRIN: Transferrin: 229 mg/dL (ref 212.0–360.0)

## 2019-12-13 MED ORDER — AMBULATORY NON FORMULARY MEDICATION: 0 refills | Status: DC

## 2019-12-13 NOTE — Progress Notes (Signed)
Internal Medicine Clinic Attending  CCM services provided by the care management provider and their documentation were discussed with Dr. Truman Hayward. We reviewed the pertinent findings, urgent action items addressed by the resident and non-urgent items to be addressed by the PCP.  I agree with the assessment, diagnosis, and plan of care documented in the CCM and resident's note.  Axel Filler, MD 12/13/2019

## 2019-12-13 NOTE — Progress Notes (Signed)
Internal Medicine Clinic Attending  I saw and evaluated the patient.  I personally confirmed the key portions of the history and exam documented by Dr. Liang and I reviewed pertinent patient test results.  The assessment, diagnosis, and plan were formulated together and I agree with the documentation in the resident's note.  

## 2019-12-13 NOTE — Patient Instructions (Signed)
Your provider has requested that you go to the basement level for lab work before leaving today. Press "B" on the elevator. The lab is located at the first door on the left as you exit the elevator.  If you are age 62 or older, your body mass index should be between 23-30. Your Body mass index is 41.43 kg/m. If this is out of the aforementioned range listed, please consider follow up with your Primary Care Provider.  If you are age 16 or younger, your body mass index should be between 19-25. Your Body mass index is 41.43 kg/m. If this is out of the aformentioned range listed, please consider follow up with your Primary Care Provider.   Due to recent changes in healthcare laws, you may see the results of your imaging and laboratory studies on MyChart before your provider has had a chance to review them.  We understand that in some cases there may be results that are confusing or concerning to you. Not all laboratory results come back in the same time frame and the provider may be waiting for multiple results in order to interpret others.  Please give [place] 48 hours in order for your provider to thoroughly review all the results before contacting the office for clarification of your results.   You have been given Rx for a Abdominal Binder. Please take to pharmacy or durable medical supply store to fill.   Please keep follow-up in 3 month. Office will call to schedule.   Thank you for choosing me and [facility].  Dr. Rush Landmark

## 2019-12-13 NOTE — Progress Notes (Signed)
Internal Medicine Clinic Resident  I have personally reviewed this encounter including the documentation in this note and/or discussed this patient with the care management provider. I will address any urgent items identified by the care management provider and will communicate my actions to the patient's PCP. I have reviewed the patient's CCM visit with my supervising attending, Dr Evette Doffing.  Mosetta Anis, MD 12/13/2019

## 2019-12-14 LAB — IRON, TOTAL/TOTAL IRON BINDING CAP: Iron: 61 ug/dL (ref 50–180)

## 2019-12-14 NOTE — Progress Notes (Signed)
Internal Medicine Clinic Attending  CCM services provided by the care management provider and their documentation were discussed with Dr. [place]. We reviewed the pertinent findings, urgent action items addressed by the resident and non-urgent items to be addressed by the PCP.  I agree with the assessment, diagnosis, and plan of care documented in the CCM and resident's note.  Sharri Loya C Yecenia Dalgleish, DO 12/14/2019  

## 2019-12-16 ENCOUNTER — Encounter: Payer: Self-pay | Admitting: Gastroenterology

## 2019-12-16 DIAGNOSIS — K439 Ventral hernia without obstruction or gangrene: Secondary | ICD-10-CM | POA: Insufficient documentation

## 2019-12-16 DIAGNOSIS — Z8719 Personal history of other diseases of the digestive system: Secondary | ICD-10-CM

## 2019-12-16 DIAGNOSIS — D649 Anemia, unspecified: Secondary | ICD-10-CM | POA: Insufficient documentation

## 2019-12-16 DIAGNOSIS — Z862 Personal history of diseases of the blood and blood-forming organs and certain disorders involving the immune mechanism: Secondary | ICD-10-CM | POA: Insufficient documentation

## 2019-12-16 HISTORY — DX: Personal history of other diseases of the digestive system: Z87.19

## 2019-12-16 NOTE — Progress Notes (Signed)
[place] VISIT   Primary Care Provider [name], MD [place] [address] [phone]  Patient Profile: Terry Parks is a 64 y.o. male with a pmh significant for Afib (on Xarelto), HTN, HLD, OSA, BPH, MDD, Nephrolithiasis, DM, HFrEF, GERD, esophagitis, prior IDA.  The patient presents to the [facility] for an evaluation and management of problem(s) noted below:  Problem List 1. History of iron deficiency anemia   2. Anemia, unspecified type   3. History of esophagitis   4. Ventral hernia without obstruction or gangrene     History of Present Illness: Please see initial consultation note and progress notes for full details of HPI.  Interval History The patient returns for scheduled follow-up, I have not seen him in almost a year..  Continues to be off oral iron.  He is tired today but relates that it is a result of having so many doctors visits recently.  Bowels are unchanged at this time.  No blood in stools.  GERD is well controlled.  He did have a low blood count a few months ago though no iron indices were obtained.  They have not been rechecked.  He has no significant abdominal pain but has noted a bit of a progressive hernia in the near his umbilical region.  GI Review of Systems Positive as above Negative for dysphagia, odynophagia, nausea, vomiting, melena, hematochezia   Review of Systems General: Denies fevers/chills/unintentional weight loss Cardiovascular: Denies chest pain Pulmonary: Denies shortness of breath out of proportion to normal Gastroenterological: See HPI Genitourinary: Denies darkened urine or hematuria Hematological: Positive for bruising/bleeding due to anticoagulation Dermatological: Denies jaundice Psychological: Mood is stable   Medications Current Outpatient Medications  Medication Sig Dispense Refill  . Accu-Chek Softclix Lancets lancets Check blood sugar three times a  day as instructed 300 each 3  . acetaminophen (TYLENOL) 325 MG tablet Take 325-650 mg by mouth every 6 (six) hours as needed (for pain).    [name] amiodarone (PACERONE) 200 MG tablet TAKE 1 TABLET BY MOUTH DAILY. 30 tablet 1  . bisacodyl (DULCOLAX) 5 MG EC tablet Take 5 mg by mouth daily as needed for moderate constipation.    . Blood Glucose Monitoring Suppl (ACCU-CHEK AVIVA) device Use as instructed to check blood sugar up to 3 times a day 1 each 0  . empagliflozin (JARDIANCE) 10 MG TABS tablet Take 1 tablet (10 mg total) by mouth daily before breakfast. 30 tablet 2  . 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 insulin dependent 300 each 11  . Insulin Degludec-Liraglutide (XULTOPHY) 100-3.6 UNIT-MG/ML SOPN Inject 50 Units into the skin daily. 5 pen 11  . Insulin Pen Needle (CARETOUCH PEN NEEDLES) 31G X 6 MM MISC 1 pen by Does not apply route at bedtime. 100 each 3  . isosorbide mononitrate (IMDUR) 30 MG 24 hr tablet Take 1 tablet (30 mg total) by mouth daily. 90 tablet 3  . losartan (COZAAR) 100 MG tablet Take 1 tablet (100 mg total) by mouth daily. 90 tablet 0  . metFORMIN (GLUCOPHAGE XR) 500 MG 24 hr tablet Take 4 tablets (2,000 mg total) by mouth daily with breakfast. 360 tablet 3  . nystatin (NYSTATIN) powder Apply 1 application topically 3 (three) times daily. 15 g 0  . omeprazole (PRILOSEC) 40 MG capsule Take 1 capsule (40 mg total) by mouth 2 (two) times daily before a meal. 60 capsule 3  .  rivaroxaban (XARELTO) 20 MG TABS tablet Take 1 tablet (20 mg total) by mouth daily with breakfast. 90 tablet 0  . spironolactone (ALDACTONE) 50 MG tablet TAKE 1 TABLET (50 MG TOTAL) BY MOUTH DAILY. 90 tablet 1  . spironolactone (ALDACTONE) 50 MG tablet Take 1 tablet (50 mg total) by mouth daily. 30 tablet 0  . torsemide (DEMADEX) 20 MG tablet Take 2 tablets (40 mg total) by mouth daily. 180 tablet 0  . AMBULATORY NON FORMULARY MEDICATION Abdominal  Binder - Use as directed 1 Units 0  . carvedilol (COREG) 25 MG tablet Take 1 tablet (25 mg total) by mouth 2 (two) times daily with a meal. 180 tablet 1   No current facility-administered medications for this visit.    Allergies Allergies  Allergen Reactions  . Lisinopril Cough    Histories Past Medical History:  Diagnosis Date  . Abscessed tooth    top back large cavity no pain or drainage, one on bottom  pt pulled tooth 4-5 months ago, right top large hole in tooth  . AKI (acute kidney injury) ([place])   . Allergic rhinitis   . Anemia   . Anxiety   . Asthma   . Atrial fibrillation ([place])   . BPH (benign prostatic hypertrophy)    Massive BPH noted on cystoscopy 1/23/ 2012 by Dr. Risa Grill.  . Cancer [facility])    prostate cancer 2019  . Cardiomyopathy ([place])   . CHF (congestive heart failure) ([place])   . Cough 03/30/2012  . Depression   . Diabetes mellitus 04/08/2008   type 2  . Foley catheter in place 07-05-17 placed  . Headache(784.0)    hx migraines none recent  . Hyperlipemia   . Hypertension   . Hypertensive cardiopathy 03/01/2006   2-D echocardiogram 02/01/2012 showed moderate LVH, mildly to moderately reduced left ventricular systolic function with an estimated ejection fraction of 40-45%, and diffuse hypokinesis.  A nuclear medicine stress study done 01/31/2012 showed no reversible ischemia, a small mid anterior wall fixed defect/infarct, and ejection fraction 42%.      . Neck pain   . Nephrolithiasis 05/29/2010   CT scan of abdomen/pelvis on 05/29/2010 showed an obstructing approximate 1-2 mm calculus at the left UVJ, and an approximate 1-2 mm left lower pole renal calculus.   Patient had continuing severe pain , and an elevation of his serum creatinine to a value of 1.75 on 06/06/2010.  Patient underwent cystoscopy on 06/08/2010 by Dr. Risa Grill, but attempts at retrograde pyelogram and ureteroscopy were unsucc  . Numbness 01/08/2018  . Obstructive sleep apnea 03/06/2008   Sleep study  03/06/08 showed severe OSA/hypopnea syndrome, with successful CPAP titration to 13 CWP using a medium ResMed Mirage Quattro full face mask with heated humidifier.   . Rash 04/17/2014  . Renal calculus 05/29/2010   CT scan of abdomen/pelvis on 05/29/2010 showed an obstructing approximate 1-2 mm calculus at the left UVJ, and an approximate 1-2 mm left lower pole renal calculus.   Patient had continuing severe pain , and an elevation of his serum creatinine to a value of 1.75 on 06/06/2010.  The stone had apparently passed and was not seen on repeat CT 06/08/2010.  . Sleep apnea    haven't use cpap in 2 years  . Tooth pain 10/29/2017  . Urinary straining 11/02/2016   Past Surgical History:  Procedure Laterality Date  . big toe nails removed Bilateral 20 yrs ago  . CARDIOVERSION N/A 06/08/2017   Procedure: CARDIOVERSION;  Surgeon: Johnsie Cancel,  Wallis Bamberg, MD;  Location: [facility] ENDOSCOPY;  Service: Cardiovascular;  Laterality: N/A;  . COLONOSCOPY    . CYSTOSCOPY W/ RETROGRADES    . IR RADIOLOGIST EVAL & MGMT  02/02/2018  . LYMPHADENECTOMY Bilateral 10/06/2017   Procedure: LYMPHADENECTOMY;  Surgeon: Lucas Mallow, MD;  Location: [facility] ORS;  Service: Urology;  Laterality: Bilateral;  . ROBOT ASSISTED LAPAROSCOPIC RADICAL PROSTATECTOMY N/A 10/06/2017   Procedure: XI ROBOTIC ASSISTED LAPAROSCOPIC RADICAL PROSTATECTOMY;  Surgeon: Lucas Mallow, MD;  Location: [facility] ORS;  Service: Urology;  Laterality: N/A;  . TRANSURETHRAL RESECTION OF BLADDER TUMOR N/A 07/13/2017   Procedure: TRANSURETHRAL RESECTION OF PROSTATE;  Surgeon: Lucas Mallow, MD;  Location: [facility] ORS;  Service: Urology;  Laterality: N/A;   Social History   Socioeconomic History  . Marital status: Single    Spouse name: Not on file  . Number of children: 4  . Years of education: 39  . Highest education level: Not on file  Occupational History  . Occupation:        Employer: UNEMPLOYED  Tobacco Use  . Smoking status: Never Smoker  . Smokeless  tobacco: Never Used  Vaping Use  . Vaping Use: Never used  Substance and Sexual Activity  . Alcohol use: No    Alcohol/week: 0.0 standard drinks  . Drug use: No  . Sexual activity: Not Currently  Other Topics Concern  . Not on file  Social History Narrative   Divorced, 4 children, lives alone.     Social Determinants of Health   Financial Resource Strain: Low Risk   . Difficulty of Paying Living Expenses: Not hard at all  Food Insecurity: No Food Insecurity  . Worried About Charity fundraiser in the Last Year: Never true  . Ran Out of Food in the Last Year: Never true  Transportation Needs: No Transportation Needs  . Lack of Transportation (Medical): No  . Lack of Transportation (Non-Medical): No  Physical Activity: Inactive  . Days of Exercise per Week: 0 days  . Minutes of Exercise per Session: 0 min  Stress:   . Feeling of Stress :   Social Connections: Unknown  . Frequency of Communication with Friends and Family: More than three times a week  . Frequency of Social Gatherings with Friends and Family: More than three times a week  . Attends Religious Services: More than 4 times per year  . Active Member of Clubs or Organizations: Not on file  . Attends Archivist Meetings: Not on file  . Marital Status: Not on file  Intimate Partner Violence:   . Fear of Current or Ex-Partner:   . Emotionally Abused:   [name] Physically Abused:   . Sexually Abused:    Family History  Problem Relation Age of Onset  . Breast cancer Mother   . Hypertension Father   . Diabetes Maternal Grandmother   . Colon cancer Neg Hx   . Prostate cancer Neg Hx   . Heart attack Neg Hx   . Esophageal cancer Neg Hx   . Inflammatory bowel disease Neg Hx   . Liver disease Neg Hx   . Pancreatic cancer Neg Hx   . Rectal cancer Neg Hx   . Stomach cancer Neg Hx    I have reviewed his medical, social, and family history in detail and updated the electronic medical record as necessary.     PHYSICAL EXAMINATION  BP (!) 138/80   Pulse 84   Ht 6'  1" (1.854 m)   Wt (!) 314 lb (142.4 kg)   BMI 41.43 kg/m  GEN: NAD, appears stated age, doesn't appear chronically ill PSYCH: Cooperative, without pressured speech EYE: Conjunctivae pink, sclerae anicteric ENT: MMM CV: Nontachycardic without rubs or gallops RESP: CTAB posteriorly, without wheezing GI: NABS, soft, obese, rounded, ventral diastases, nontender, unable to appreciate hepatosplenomegaly  MSK/EXT: Trace bilateral lower extremity edema SKIN: No jaundice NEURO:  Alert & Oriented x 3, no focal deficits   REVIEW OF DATA  I reviewed the following data at the time of this encounter:  GI Procedures and Studies  Previously reviewed  Laboratory Studies  Reviewed in epic  Imaging Studies  No new imaging studies to review   ASSESSMENT  Mr. Eves is a 64 y.o. male with a pmh significant for Afib (on Xarelto), HTN, HLD, OSA, BPH, MDD, Nephrolithiasis, DM, HFrEF, GERD, esophagitis, IDA (improving).  The patient is seen today for evaluation and management of:  1. History of iron deficiency anemia   2. Anemia, unspecified type   3. History of esophagitis   4. Ventral hernia without obstruction or gangrene    The patient is hemodynamically and clinically stable.  He had been doing well previously.  However, a few months ago he had a drop in his hemoglobin and had not had a repeat hemoglobin checked.  We will recheck that as well as iron indices.  He is remained off oral iron for quite a few months now.  If the patient is found to have recurrent iron deficiency, he will require IV iron once again and likely a video capsule endoscopy.  We will see how his labs look.  He will continue his current PPI dosing as his GERD symptoms are well controlled.  He does have an abdominal hernia and I will prescribe an abdominal binder.  He is not sure that he would want to undergo surgery so we will hold on a referral for now.  All  patient questions were answered, to the best of my ability, and the patient agrees to the aforementioned plan of action with follow-up as indicated.   PLAN  Laboratories as outlined below If recurrent iron deficiency then he needs to restart oral iron and be given 1 more IV iron infusion and then have labs drawn 4 to 6 weeks after his iron infusion If he continues to have iron deficiency then will proceed with enteroscopy plus video capsule endoscopy If no evidence of iron deficiency will hold on repeat endoscopic evaluation Continue current PPI dosing High-fiber diet FiberCon or fiber supplement 1-2 times daily Abdominal binder prescription written for DME   Orders Placed This Encounter  Procedures  . CBC  . Ferritin  . Transferrin  . Iron Binding Cap (TIBC)    New Prescriptions   AMBULATORY NON FORMULARY MEDICATION    Abdominal Binder - Use as directed   Modified Medications   No medications on file    Planned Follow Up: Return in about 3 months (around 03/14/2020).  Total Time in Face-to-Face and in Coordination of Care for patient including independent/personal interpretation/review of prior testing, medical history, examination, medication adjustment, communicating results with the patient directly, and documentation with the EHR is 25 minutes.   [name], MD [facility] Office # [phone]

## 2019-12-18 ENCOUNTER — Telehealth: Payer: Medicaid Other

## 2019-12-21 MED FILL — JARDIANCE 10 MG TABLET: 10 | 30 days supply | Qty: 30 | Fill #0

## 2019-12-31 ENCOUNTER — Ambulatory Visit: Payer: Medicaid Other | Admitting: *Deleted

## 2019-12-31 DIAGNOSIS — E1159 Type 2 diabetes mellitus with other circulatory complications: Secondary | ICD-10-CM

## 2019-12-31 DIAGNOSIS — I4819 Other persistent atrial fibrillation: Secondary | ICD-10-CM

## 2019-12-31 DIAGNOSIS — E1142 Type 2 diabetes mellitus with diabetic polyneuropathy: Secondary | ICD-10-CM

## 2019-12-31 DIAGNOSIS — N183 Chronic kidney disease, stage 3 unspecified: Secondary | ICD-10-CM

## 2019-12-31 DIAGNOSIS — I5042 Chronic combined systolic (congestive) and diastolic (congestive) heart failure: Secondary | ICD-10-CM

## 2019-12-31 NOTE — Chronic Care Management (AMB) (Signed)
  Care Management   Follow Up Note   12/31/2019 Name: Terry Parks MRN: [id number] DOB: 1955-11-15  Referred by: Terry Beard, MD Reason for referral : Care Coordination (HF, IDDM, HTN, HLD, OSA)   Terry Parks is a 64 y.o. year old male who is a primary care patient of Terry Beard, MD. The care management team was consulted for assistance with care management and care coordination needs.    Review of patient status, including review of consultants reports, relevant laboratory and other test results, and collaboration with appropriate care team members and the patient's provider was performed as part of comprehensive patient evaluation and provision of chronic care management services.    SDOH (Social Determinants of Health) assessments performed: No See Care Plan activities for detailed interventions related to [name])     Advanced Directives: See Care Plan and Vynca application for related entries.   Goals Addressed              This Visit's Progress     Patient Stated   .  "I want to get my hernia fixed but I need a referral to a surgeon." (pt-stated)        [name] (see longitudinal plan of care for additional care plan information)  Current Barriers:  [name] Knowledge Deficits related to securing referral to general surgeon - patient left a message for this CCM RN he never received a call from the surgeon's office and is asking for help in securing an appointment  Nurse Case Manager Clinical Goal(s):  [name] Over the next 30-60 days, patient will work with CCM RN and  provider to address needs related to general surgeon consult for hernia repair  Interventions:  . Inter-disciplinary care team collaboration (see longitudinal plan of care) . Spoke with Terry Parks at [facility], she verified the referral was received. She states she will have a patient care coordinator follow up regarding the referral.    Patient Self Care Activities:  . Patient  verbalizes understanding of plan to work with health care team to secure general surgeon consult.  . Unable to independently secure referral to general surgeon  Please see past updates related to this goal by clicking on the "Past Updates" button in the selected goal          The care management team will reach out to the patient again over the next 30-60 days.   Terry Churn RN, CCM, [facility] RN Care Manager [phone]

## 2020-01-01 ENCOUNTER — Telehealth: Payer: Medicaid Other

## 2020-01-01 ENCOUNTER — Telehealth: Payer: Self-pay | Admitting: *Deleted

## 2020-01-01 NOTE — Telephone Encounter (Signed)
  Care Management   Outreach Note  01/01/2020 Name: Vishwa Dais MRN: [id number] DOB: [birth year]  Referred by: [name], MD Reason for referral : Care Coordination (IDDM, HTN, HF)   An unsuccessful telephone outreach was attempted today. The patient was referred to the case management team for assistance with care management and care coordination. Left message on patient's contact number advising him this CCM RN contacted [facility] to make them aware that he has not been called about scheduling a surgical consult  Follow Up Plan: Telephone follow up appointment with care management team member scheduled for:01/11/20 at 3:00 pm.  [name] RN, CCM, [facility] RN Care Manager [phone]

## 2020-01-01 NOTE — Progress Notes (Signed)
Internal Medicine Clinic Resident  I have personally reviewed this encounter including the documentation in this note and/or discussed this patient with the care management provider. I will address any urgent items identified by the care management provider and will communicate my actions to the patient's PCP. I have reviewed the patient's CCM visit with my supervising attending, Dr Philipp Ovens.  Andrew Au, MD 01/01/2020

## 2020-01-04 NOTE — Progress Notes (Signed)
Internal Medicine Clinic Attending  CCM services provided by the care management provider and their documentation were discussed with Dr. Chen. We reviewed the pertinent findings, urgent action items addressed by the resident and non-urgent items to be addressed by the PCP.  I agree with the assessment, diagnosis, and plan of care documented in the CCM and resident's note.  Natalya Domzalski, MD 01/04/2020 

## 2020-01-09 MED FILL — SPIRONOLACTONE 50 MG TABS: 50 | 34 days supply | Qty: 34 | Fill #0

## 2020-01-09 MED FILL — TORSEMIDE 20 MG TABLET: 20 | 30 days supply | Qty: 150 | Fill #1

## 2020-01-10 ENCOUNTER — Other Ambulatory Visit: Payer: Self-pay | Admitting: *Deleted

## 2020-01-10 MED ORDER — NYSTATIN 100000 UNIT/GM EX POWD: 1.0000 "application " | Freq: Three times a day (TID) | CUTANEOUS | 1 refills | Status: DC

## 2020-01-10 MED FILL — NYSTATIN 100,000 UNIT/GM PO: 100000 | 20 days supply | Qty: 60 | Fill #0

## 2020-01-10 NOTE — Telephone Encounter (Signed)
For candidiasis due to urinary incontinence 2/2 radical prostatectomy

## 2020-01-11 ENCOUNTER — Telehealth: Payer: Medicaid Other

## 2020-01-11 ENCOUNTER — Other Ambulatory Visit: Payer: Self-pay

## 2020-01-11 ENCOUNTER — Other Ambulatory Visit: Payer: Self-pay | Admitting: Cardiovascular Disease

## 2020-01-11 ENCOUNTER — Encounter: Payer: Self-pay | Admitting: Cardiovascular Disease

## 2020-01-11 ENCOUNTER — Ambulatory Visit: Payer: Medicaid Other | Admitting: Cardiovascular Disease

## 2020-01-11 VITALS — BP 128/77 | HR 87 | Ht 73.0 in | Wt 322.6 lb

## 2020-01-11 DIAGNOSIS — I10 Essential (primary) hypertension: Secondary | ICD-10-CM

## 2020-01-11 DIAGNOSIS — G4733 Obstructive sleep apnea (adult) (pediatric): Secondary | ICD-10-CM

## 2020-01-11 DIAGNOSIS — Z79899 Other long term (current) drug therapy: Secondary | ICD-10-CM

## 2020-01-11 DIAGNOSIS — I5042 Chronic combined systolic (congestive) and diastolic (congestive) heart failure: Secondary | ICD-10-CM

## 2020-01-11 DIAGNOSIS — E1142 Type 2 diabetes mellitus with diabetic polyneuropathy: Secondary | ICD-10-CM | POA: Diagnosis not present

## 2020-01-11 DIAGNOSIS — I428 Other cardiomyopathies: Secondary | ICD-10-CM

## 2020-01-11 DIAGNOSIS — Z5181 Encounter for therapeutic drug level monitoring: Secondary | ICD-10-CM

## 2020-01-11 DIAGNOSIS — I480 Paroxysmal atrial fibrillation: Secondary | ICD-10-CM

## 2020-01-11 DIAGNOSIS — Z7901 Long term (current) use of anticoagulants: Secondary | ICD-10-CM | POA: Diagnosis not present

## 2020-01-11 DIAGNOSIS — E785 Hyperlipidemia, unspecified: Secondary | ICD-10-CM | POA: Diagnosis not present

## 2020-01-11 MED ORDER — ATORVASTATIN CALCIUM 40 MG PO TABS
40.0000 mg | ORAL_TABLET | Freq: Every day | ORAL | 3 refills | Status: DC
Start: 2020-01-11 — End: 2020-08-07

## 2020-01-11 MED ORDER — ENTRESTO 49-51 MG PO TABS: 1.0000 | ORAL_TABLET | Freq: Two times a day (BID) | ORAL | 11 refills | Status: DC

## 2020-01-11 MED FILL — ATORVASTATIN 40 MG TABLET: 40 | 90 days supply | Qty: 90 | Fill #0

## 2020-01-11 NOTE — Progress Notes (Signed)
Patient ID: Terry Parks, male   DOB: Dec 08, 1955, 64 y.o.   MRN: [id number]    Cardiology Office Note    Date:  01/13/2020   ID:  Terry Parks, DOB [birth year], MRN [id number]  PCP:  [name], MD  Cardiologist:   [name], MD   Chief Complaint  Patient presents with   Congestive Heart Failure    History of Present Illness:  Terry Parks is a 64 y.o. male with  combined systolic and diastolic heart failure (LVEF 35-40% by most recent echo October 2020) paroxysmal atrial fibrillation. He has never had catheterization and has not had angina pectoris, but a nuclear stress test in 2013 showed a fixed anterior defect.  He has systemic hypertension, severe obesity, obstructive sleep apnea on CPAP, and diabetes mellitus.   The diagnosis of heart failure dates back to at least 2013 when he had a stress test that showed a fixed anterior defect and an ejection fraction of 30-35%.  He was hospitalized with acute heart failure exacerbation in January 2019, in the setting of acute kidney injury secondary to nephrolithiasis with ureteral obstruction.  During that same admission he had atrial fibrillation, treated with cardioversion but followed by recurrence on atrial flutter, treated with amiodarone and anticoagulation with rivaroxaban.  He underwent repeat hospitalization for heart failure exacerbation in March 2021.  He had manifestations of both right and left heart failure and the cause was felt to be dietary sodium indiscretion.  He was in sinus rhythm.  He diuresed about 7 L on IV medications.  Hospital discharge weight was 318 pounds (144 kg).  Discharge creatinine was 1.57, but baseline creatinine appears to be around 1.3-1.4 (GFR around 60).  The patient specifically denies any chest pain at rest exertion, dyspnea at rest or with exertion, orthopnea, paroxysmal nocturnal dyspnea, syncope, palpitations, focal neurological deficits, intermittent claudication, lower extremity  edema, unexplained weight gain, cough, hemoptysis or wheezing.  Glycemic control remains poor and a recent hemoglobin A1c was 10.1%.  His most recent lipid profile from July 28 showed very abnormal findings and I am not sure why, but his atorvastatin prescription has been stopped.  He reports that one of his bottles at home has the words "for cholesterol" but he does not recall the name of the medication.  We went over every single one of the medications on his list today describing what the purpose of those medicines is.  Past Medical History:  Diagnosis Date   Abscessed tooth    top back large cavity no pain or drainage, one on bottom  pt pulled tooth 4-5 months ago, right top large hole in tooth   AKI (acute kidney injury) ([place])    Allergic rhinitis    Anemia    Anxiety    Asthma    Atrial fibrillation (HCC)    BPH (benign prostatic hypertrophy)    Massive BPH noted on cystoscopy 1/23/ 2012 by Dr. Risa Grill.   Cancer [facility])    prostate cancer 2019   Cardiomyopathy [facility])    CHF (congestive heart failure) ([place])    Cough 03/30/2012   Depression    Diabetes mellitus 04/08/2008   type 2   Foley catheter in place 07-05-17 placed   Headache(784.0)    hx migraines none recent   Hyperlipemia    Hypertension    Hypertensive cardiopathy 03/01/2006   2-D echocardiogram 02/01/2012 showed moderate LVH, mildly to moderately reduced left ventricular systolic function with an estimated ejection fraction of 40-45%, and diffuse hypokinesis.  A nuclear medicine stress study done 01/31/2012 showed no reversible ischemia, a small mid anterior wall fixed defect/infarct, and ejection fraction 42%.       Neck pain    Nephrolithiasis 05/29/2010   CT scan of abdomen/pelvis on 05/29/2010 showed an obstructing approximate 1-2 mm calculus at the left UVJ, and an approximate 1-2 mm left lower pole renal calculus.   Patient had continuing severe pain , and an elevation of his serum creatinine to a  value of 1.75 on 06/06/2010.  Patient underwent cystoscopy on 06/08/2010 by Dr. Risa Grill, but attempts at retrograde pyelogram and ureteroscopy were unsucc   Numbness 01/08/2018   Obstructive sleep apnea 03/06/2008   Sleep study 03/06/08 showed severe OSA/hypopnea syndrome, with successful CPAP titration to 13 CWP using a medium ResMed Mirage Quattro full face mask with heated humidifier.    Rash 04/17/2014   Renal calculus 05/29/2010   CT scan of abdomen/pelvis on 05/29/2010 showed an obstructing approximate 1-2 mm calculus at the left UVJ, and an approximate 1-2 mm left lower pole renal calculus.   Patient had continuing severe pain , and an elevation of his serum creatinine to a value of 1.75 on 06/06/2010.  The stone had apparently passed and was not seen on repeat CT 06/08/2010.   Sleep apnea    haven't use cpap in 2 years   Tooth pain 10/29/2017   Urinary straining 11/02/2016    Past Surgical History:  Procedure Laterality Date   big toe nails removed Bilateral 20 yrs ago   CARDIOVERSION N/A 06/08/2017   Procedure: CARDIOVERSION;  Surgeon: Josue Hector, MD;  Location: [place];  Service: Cardiovascular;  Laterality: N/A;   COLONOSCOPY     CYSTOSCOPY W/ RETROGRADES     IR RADIOLOGIST EVAL & MGMT  02/02/2018   LYMPHADENECTOMY Bilateral 10/06/2017   Procedure: LYMPHADENECTOMY;  Surgeon: Lucas Mallow, MD;  Location: [facility];  Service: Urology;  Laterality: Bilateral;   ROBOT ASSISTED LAPAROSCOPIC RADICAL PROSTATECTOMY N/A 10/06/2017   Procedure: XI ROBOTIC ASSISTED LAPAROSCOPIC RADICAL PROSTATECTOMY;  Surgeon: Lucas Mallow, MD;  Location: [facility];  Service: Urology;  Laterality: N/A;   TRANSURETHRAL RESECTION OF BLADDER TUMOR N/A 07/13/2017   Procedure: TRANSURETHRAL RESECTION OF PROSTATE;  Surgeon: Lucas Mallow, MD;  Location: [facility];  Service: Urology;  Laterality: N/A;    Outpatient Medications Prior to Visit  Medication Sig Dispense Refill   Accu-Chek  Softclix Lancets lancets Check blood sugar three times a day as instructed 300 each 3   acetaminophen (TYLENOL) 325 MG tablet Take 325-650 mg by mouth every 6 (six) hours as needed (for pain).     AMBULATORY NON FORMULARY MEDICATION Abdominal Binder - Use as directed 1 Units 0   amiodarone (PACERONE) 200 MG tablet TAKE 1 TABLET BY MOUTH DAILY. 30 tablet 1   bisacodyl (DULCOLAX) 5 MG EC tablet Take 5 mg by mouth daily as needed for moderate constipation.     Blood Glucose Monitoring Suppl (ACCU-CHEK AVIVA) device Use as instructed to check blood sugar up to 3 times a day 1 each 0   empagliflozin (JARDIANCE) 10 MG TABS tablet Take 1 tablet (10 mg total) by mouth daily before breakfast. 30 tablet 2   glucose blood (ACCU-CHEK AVIVA) test strip Use to check blood sugar 3 times daily diag code E11.42. insulin dependent 300 each 11   Insulin Degludec-Liraglutide (XULTOPHY) 100-3.6 UNIT-MG/ML SOPN Inject 50 Units into the skin daily. 5 pen 11   Insulin Pen  Needle (CARETOUCH PEN NEEDLES) 31G X 6 MM MISC 1 pen by Does not apply route at bedtime. 100 each 3   metFORMIN (GLUCOPHAGE XR) 500 MG 24 hr tablet Take 4 tablets (2,000 mg total) by mouth daily with breakfast. 360 tablet 3   nystatin (NYSTATIN) powder Apply 1 application topically 3 (three) times daily. 60 g 1   omeprazole (PRILOSEC) 40 MG capsule Take 1 capsule (40 mg total) by mouth 2 (two) times daily before a meal. 60 capsule 3   rivaroxaban (XARELTO) 20 MG TABS tablet Take 1 tablet (20 mg total) by mouth daily with breakfast. 90 tablet 0   spironolactone (ALDACTONE) 50 MG tablet Take 1 tablet (50 mg total) by mouth daily. 30 tablet 0   torsemide (DEMADEX) 20 MG tablet Take 2 tablets (40 mg total) by mouth daily. 180 tablet 0   isosorbide mononitrate (IMDUR) 30 MG 24 hr tablet Take 1 tablet (30 mg total) by mouth daily. 90 tablet 3   losartan (COZAAR) 100 MG tablet Take 1 tablet (100 mg total) by mouth daily. 90 tablet 0    spironolactone (ALDACTONE) 50 MG tablet TAKE 1 TABLET (50 MG TOTAL) BY MOUTH DAILY. 90 tablet 1   carvedilol (COREG) 25 MG tablet Take 1 tablet (25 mg total) by mouth 2 (two) times daily with a meal. 180 tablet 1   No facility-administered medications prior to visit.     Allergies:   Lisinopril   Social History   Socioeconomic History   Marital status: Single    Spouse name: Not on file   Number of children: 4   Years of education: 16   Highest education level: Not on file  Occupational History   Occupation:        Employer: UNEMPLOYED  Tobacco Use   Smoking status: Never Smoker   Smokeless tobacco: Never Used  Scientific laboratory technician Use: Never used  Substance and Sexual Activity   Alcohol use: No    Alcohol/week: 0.0 standard drinks   Drug use: No   Sexual activity: Not Currently  Other Topics Concern   Not on file  Social History Narrative   Divorced, 4 children, lives alone.     Social Determinants of Health   Financial Resource Strain: Low Risk    Difficulty of Paying Living Expenses: Not hard at all  Food Insecurity: No Food Insecurity   Worried About Charity fundraiser in the Last Year: Never true   [employer] in the Last Year: Never true  Transportation Needs: No Transportation Needs   Lack of Transportation (Medical): No   Lack of Transportation (Non-Medical): No  Physical Activity: Inactive   Days of Exercise per Week: 0 days   Minutes of Exercise per Session: 0 min  Stress:    Feeling of Stress : Not on file  Social Connections: Unknown   Frequency of Communication with Friends and Family: More than three times a week   Frequency of Social Gatherings with Friends and Family: More than three times a week   Attends Religious Services: More than 4 times per year   Active Member of [employer] or Organizations: Not on file   Attends Archivist Meetings: Not on file   Marital Status: Not on file     Family History:  The  patient's family history includes Breast cancer in his mother; Diabetes in his maternal grandmother; Hypertension in his father.   ROS:   Please see the history of  present illness.    All other systems are reviewed and are negative.   PHYSICAL EXAM:   VS:  BP 128/77    Pulse 87    Ht 6\' 1"  (1.854 m)    Wt (!) 322 lb 9.6 oz (146.3 kg)    SpO2 98%    BMI 42.56 kg/m     General: Alert, oriented x3, no distress, morbidly obese Head: no evidence of trauma, PERRL, EOMI, no exophtalmos or lid lag, no myxedema, no xanthelasma; normal ears, nose and oropharynx Neck: normal jugular venous pulsations and no hepatojugular reflux; brisk carotid pulses without delay and no carotid bruits Chest: clear to auscultation, no signs of consolidation by percussion or palpation, normal fremitus, symmetrical and full respiratory excursions Cardiovascular: normal position and quality of the apical impulse, regular rhythm, normal first and second heart sounds, no murmurs, rubs or gallops Abdomen: no tenderness or distention, no masses by palpation, no abnormal pulsatility or arterial bruits, normal bowel sounds, no hepatosplenomegaly Extremities: no clubbing, cyanosis or edema; 2+ radial, ulnar and brachial pulses bilaterally; 2+ right femoral, posterior tibial and dorsalis pedis pulses; 2+ left femoral, posterior tibial and dorsalis pedis pulses; no subclavian or femoral bruits Neurological: grossly nonfocal Psych: Normal mood and affect   Wt Readings from Last 3 Encounters:  01/11/20 (!) 322 lb 9.6 oz (146.3 kg)  12/13/19 (!) 314 lb (142.4 kg)  12/12/19 (!) 311 lb 11.2 oz (141.4 kg)      Studies/Labs Reviewed:   EKG:  EKG is ordered today.  It shows normal sinus rhythm and is a normal tracing.  Recent Labs: 08/10/2019: B Natriuretic Peptide 220.7 08/13/2019: Magnesium 2.3 12/13/2019: Hemoglobin 13.3; Platelets 203.0 01/11/2020: ALT 13; BUN 8; Creatinine, Ser 1.43; Potassium 4.7; Sodium 140; TSH 0.686    Lipid Panel    Component Value Date/Time   CHOL 236 (H) 12/12/2019 1716   TRIG 182 (H) 12/12/2019 1716   HDL 28 (L) 12/12/2019 1716   CHOLHDL 8.4 (H) 12/12/2019 1716   CHOLHDL 3.9 01/09/2018 0509   VLDL 11 01/09/2018 0509   LDLCALC 174 (H) 12/12/2019 1716     ASSESSMENT:    1. Chronic combined systolic and diastolic CHF (congestive heart failure) ([place])   2. Hyperlipidemia LDL goal <70   3. PAF (paroxysmal atrial fibrillation) ([place])   4. Encounter for monitoring amiodarone therapy   5. Long term (current) use of anticoagulants   6. Essential hypertension   7. Nonischemic cardiomyopathy ([place])   8. OSA (obstructive sleep apnea)   9. Type 2 diabetes mellitus with peripheral neuropathy (HCC)   10. Morbid obesity ([place])   11. Dyslipidemia (high LDL; low HDL)      PLAN:  In order of problems listed above:  1. CHF: He appears to be clinically euvolemic, NYHA functional class I-2 and his weight is very close to his hospital discharge weight.  The current dose of diuretics appears to be appropriate.  He would benefit from transition to [facility] instead of losartan.  He is on maximum usual dose of carvedilol and takes an SGLT2 inhibitor and spironolactone.  Reviewed the importance of sodium dietary restriction and the importance of continued weight monitoring.  We will bring him back in a couple of weeks to the clinical pharmacists to titrate the dose of Entresto.  I do not think that Imdur monotherapy without hydralazine is really offering him any benefit: we will stop that medicine. 2. AFib: Successfully maintaining sinus rhythm.  CHADSVasc 3-4 (HTN, CHF, DM, +/- CAD)  on anticoagulation. 3. Amiodarone monitoring: Reminded him that we need to check liver function tests and thyroid function tests at least every 6 months.  We drew those labs today and they were normal.  Also reminded him to promptly report any unexplained respiratory symptoms. 4. Anticoagulation well-tolerated, no bleeding  complications. 5. HTN: Excellent control. 6. CMP: We have never had a definitive diagnosis for the cause of his cardiomyopathy and he has not had coronary angiography.  Even though a remote nuclear stress test showed an anterior defect, the echo shows global left ventricular hypokinesis and he does not have any Q waves on ECG.  He has never had angina pectoris.  His LVEF seems to vary in direct relationship to the success of blood pressure control.  I suspect he has primarily nonischemic cardiomyopathy. 7. OSA: He reports compliance with CPAP and denies daytime hypersomnolence. 8. DM: He has retinopathy and appears to have nephropathy.  He needs to achieve much better glycemic control to avoid progression of these disorders.  I am very pleased that he is on an SGLT2 inhibitor which helps treat his heart failure as well. 9. Obesity: Fully in morbidly obese range, this underlies all his metabolic issues.. 10. HLP: A year ago he had LDL cholesterol less than 70, his LDL is now 170.  I am pretty sure he is not receiving his statin.  We sent in a new prescription for atorvastatin and asked him to check all the names on his bottles at home and call us if he finds any other "statin" among them.  His HDL cholesterol and triglycerides will not improve substantially until he achieves better glucose control.    Medication Adjustments/Labs and Tests Ordered: Current medicines are reviewed at length with the patient today.  Concerns regarding medicines are outlined above.  Medication changes, Labs and Tests ordered today are listed in the Patient Instructions below. Patient Instructions  Medication Instructions:  STOP the Imdur STOP the Losartan  START Atorvastatin 40 mg once daily START Entresto 49/51 mg twice daily  *If you need a refill on your cardiac medications before your next appointment, please call your pharmacy*   Lab Work: Your provider would like for you to have the following labs today: TSH  and CMET  If you have labs (blood work) drawn today and your tests are completely normal, you will receive your results only by:  [place] (if you have MyChart) OR  A paper copy in the mail If you have any lab test that is abnormal or we need to change your treatment, we will call you to review the results.   Testing/Procedures: None ordered   Follow-Up: At [facility], you and your health needs are our priority.  As part of our continuing mission to provide you with exceptional heart care, we have created designated Provider Care Teams.  These Care Teams include your primary Cardiologist (physician) and Advanced Practice Providers (APPs -  Physician Assistants and Nurse Practitioners) who all work together to provide you with the care you need, when you need it.  We recommend signing up for the patient portal called "MyChart".  Sign up information is provided on this After Visit Summary.  MyChart is used to connect with patients for Virtual Visits (Telemedicine).  Patients are able to view lab/test results, encounter notes, upcoming appointments, etc.  Non-urgent messages can be sent to your provider as well.   To learn more about what you can do with MyChart, go to [email or web address].  Your next appointment:   Follow up in 2-4 weeks with pharmD Follow up in 4 months with Dr. Sallyanne Kuster Signed, [name], MD  01/13/2020 3:45 PM    [address], [place], [place]  [address] Phone: [phone]; Fax: [phone]

## 2020-01-11 NOTE — Patient Instructions (Signed)
Medication Instructions:  STOP the Imdur STOP the Losartan  START Atorvastatin 40 mg once daily START Entresto 49/51 mg twice daily  *If you need a refill on your cardiac medications before your next appointment, please call your pharmacy*   Lab Work: Your provider would like for you to have the following labs today: TSH and CMET  If you have labs (blood work) drawn today and your tests are completely normal, you will receive your results only by: [name] MyChart Message (if you have MyChart) OR . A paper copy in the mail If you have any lab test that is abnormal or we need to change your treatment, we will call you to review the results.   Testing/Procedures: None ordered   Follow-Up: At [facility], you and your health needs are our priority.  As part of our continuing mission to provide you with exceptional heart care, we have created designated Provider Care Teams.  These Care Teams include your primary Cardiologist (physician) and Advanced Practice Providers (APPs -  Physician Assistants and Nurse Practitioners) who all work together to provide you with the care you need, when you need it.  We recommend signing up for the patient portal called "MyChart".  Sign up information is provided on this After Visit Summary.  MyChart is used to connect with patients for Virtual Visits (Telemedicine).  Patients are able to view lab/test results, encounter notes, upcoming appointments, etc.  Non-urgent messages can be sent to your provider as well.   To learn more about what you can do with MyChart, go to [email or web address].    Your next appointment:   Follow up in 2-4 weeks with pharmD Follow up in 4 months with Dr. Sallyanne Kuster

## 2020-01-12 LAB — COMPREHENSIVE METABOLIC PANEL
ALT: 13 IU/L (ref 0–44)
AST: 15 IU/L (ref 0–40)
Albumin/Globulin Ratio: 1.1 — ABNORMAL LOW (ref 1.2–2.2)
Albumin: 3.7 g/dL — ABNORMAL LOW (ref 3.8–4.8)
Alkaline Phosphatase: 84 IU/L (ref 48–121)
BUN/Creatinine Ratio: 6 — ABNORMAL LOW (ref 10–24)
BUN: 8 mg/dL (ref 8–27)
Bilirubin Total: 0.2 mg/dL (ref 0.0–1.2)
CO2: 19 mmol/L — ABNORMAL LOW (ref 20–29)
Calcium: 8.7 mg/dL (ref 8.6–10.2)
Chloride: 107 mmol/L — ABNORMAL HIGH (ref 96–106)
Creatinine, Ser: 1.43 mg/dL — ABNORMAL HIGH (ref 0.76–1.27)
GFR calc Af Amer: 60 mL/min/{1.73_m2} (ref >59)
GFR calc non Af Amer: 52 mL/min/{1.73_m2} — ABNORMAL LOW (ref >59)
Globulin, Total: 3.4 g/dL (ref 1.5–4.5)
Glucose: 135 mg/dL — ABNORMAL HIGH (ref 65–99)
Potassium: 4.7 mmol/L (ref 3.5–5.2)
Sodium: 140 mmol/L (ref 134–144)
Total Protein: 7.1 g/dL (ref 6.0–8.5)

## 2020-01-12 LAB — TSH: TSH: 0.686 u[IU]/mL (ref 0.450–4.500)

## 2020-01-14 ENCOUNTER — Ambulatory Visit: Payer: Medicaid Other | Admitting: *Deleted

## 2020-01-14 ENCOUNTER — Encounter: Payer: Self-pay | Admitting: *Deleted

## 2020-01-14 DIAGNOSIS — I152 Hypertension secondary to endocrine disorders: Secondary | ICD-10-CM

## 2020-01-14 DIAGNOSIS — I5042 Chronic combined systolic (congestive) and diastolic (congestive) heart failure: Secondary | ICD-10-CM

## 2020-01-14 DIAGNOSIS — N183 Chronic kidney disease, stage 3 unspecified: Secondary | ICD-10-CM

## 2020-01-14 DIAGNOSIS — I4819 Other persistent atrial fibrillation: Secondary | ICD-10-CM

## 2020-01-14 DIAGNOSIS — E1142 Type 2 diabetes mellitus with diabetic polyneuropathy: Secondary | ICD-10-CM

## 2020-01-14 NOTE — Chronic Care Management (AMB) (Signed)
Care Management   Follow Up Note   01/14/2020 Name: Terry Parks MRN: [id number] DOB: 08/17/1955  Referred by: [name], MD Reason for referral : Care Coordination (IDDM, HF, HTN)   Terry Parks is a 64 y.o. year old male who is a primary care patient of [name], MD. The care management team was consulted for assistance with care management and care coordination needs.    Review of patient status, including review of consultants reports, relevant laboratory and other test results, and collaboration with appropriate care team members and the patient's provider was performed as part of comprehensive patient evaluation and provision of chronic care management services.    SDOH (Social Determinants of Health) assessments performed: No See Care Plan activities for detailed interventions related to [name])     Advanced Directives: See Care Plan and Vynca application for related entries.   Goals Addressed              This Visit's Progress     Patient Stated   .  " I am doing much better now, I'm losing weight and I really like the PCS worker that came to my house today." (pt-stated)        [name] (see longitudinal plan of care for additional care plan information)  Current Barriers:  . Chronic Disease Management support, education, and care coordination needs related to HF, DM and HTN- patient reports he continues to  record his BP, Weight, pulse daily in his phone, he voices the correct parameters that require provider notification, he says he is pleased with his current PCS worker and that he receives services 5 times weekly  Clinical Goal(s) related to HF, DM and HTN: Over the next 30 - 60 days, patient will:  . Work with the care management team to address educational, disease management, and care coordination needs  . Begin or continue self health monitoring activities as directed today Measure and record CBG (blood glucose) at least one time daily  and Measure and record blood pressure one time daily . Call provider office for new or worsened signs and symptoms Blood glucose findings outside established parameters, Blood pressure findings outside established parameters, Chest pain, Shortness of breath, and New or worsened symptom related to HF and DM II  and HTN . Call care management team with questions or concerns . Verbalize basic understanding of patient centered plan of care established today  Interventions related to HF, DM and HTN:  . Evaluation of current treatment plans and patient's adherence to plan as established by provider . Assessed patient understanding of disease states . Assessed patient's education and care coordination needs . Provided disease specific education to patient  . Collaborated with appropriate clinical care team members regarding patient needs . Again, positive reinforcement provide to patient for taking daily vitals and recording them in his phone and taking responsibility for his health . Reviewed strategies to protect from Covid 19 infection   Patient Self Care Activities related to HF, DM and HTN:  . Patient is unable to independently self-manage chronic health conditions . Weighs daily and record (notifying MD of 3 lb weight gain over night or 5 lb in a week) . Takes Heart Failure Medications as prescribed . Verbalizes understanding of and follows CHF Action Plan . Adheres to low sodium diet  Please see past updates related to this goal by clicking on the "Past Updates" button in the selected goal      .  "[name] told  me I have heart failure and he explained it to me real well." (pt-stated)        [name] (see longitudinal plan of care for additional care plan information)  Current Barriers:  [name] Knowledge deficit related to basic heart failure pathophysiology and self care management- patient states Dr Sallyanne Kuster explained heart failure to him during his office visit on 8/27 and that he  prescribed atorvastatin and Entresto and stopped Imdur and he understands the importance of daily weights and was able to correctly identify the weight gain parameters that would require MD notification, he also says Dr Sallyanne Kuster wants him to meet with a pharmacist in his office to review his medication list  Nurse Case Manager Clinical Goal(s):   Over the next 30-60 days, patient will weigh self daily and record  Over the next 30-60  days, patient will verbalize understanding of Heart Failure Action Plan and when to call doctor  Over the next 30-60 days, patient will take all Heart Failure mediations as prescribed  Interventions:  . Basic overview and discussion of pathophysiology of Heart Failure . Reviewed Dr. Sallyanne Kuster' s office  note of 01/11/20 with patient . Reviewed Heart Failure Action Plan in depth  . Assessed for scales in home . Discussed importance of daily weight . Reviewed role of diuretics in prevention of fluid overload- discussed dual action of Jardiance in treating DM and HF . Reviewed upcoming appointment with cardiology's pharmacist on 9/23 and with Dr Sallyanne Kuster on 12/8   Patient Self Care Activities:  . Take Heart Failure Medications as prescribed . Weigh daily and record (notify MD with 3 lb weight gain over night or 5 lb in a week) . Follow CHF Action Plan . Adhere to low sodium diet  Initial goal documentation     .  "I want to get my hernia fixed but I need a referral to a surgeon." (pt-stated)        [place] (see longitudinal plan of care for additional care plan information)  Current Barriers:  [name] Knowledge Deficits related to securing referral to general surgeon - patient says he never received a call from [facility] about a surgical consult to assess his abdominal hernia  Nurse Case Manager Clinical Goal(s):  [name] Over the next 30-60 days, patient will work with CCM RN and  provider to address needs related to general surgeon consult for  hernia repair  Interventions:  . Inter-disciplinary care team collaboration (see longitudinal plan of care) . Explained to patient that per the referral notes, his managed Medicaid health plan will consider [facility] ([facility] to be in network beginning 01/16/20, at patient's request , will make a follow up call requesting [facility] reach out to patient to schedule consult    Patient Self Care Activities:  . Patient verbalizes understanding of plan to work with health care team to secure general surgeon consult.  . Unable to independently secure referral to general surgeon  Please see past updates related to this goal by clicking on the "Past Updates" button in the selected goal      .  "My blood sugar numbers look good now." (pt-stated)         [place] (see longitudinal plan of care for additional care plan information)  Objective:  Lab Results  Component Value Date   HGBA1C 10.1 (A) 12/12/2019   HGBA1C 12.7 (A) 07/12/2019   HGBA1C 7.4 (A) 01/31/2019   Lab Results  Component Value  Date   MICROALBUR 11.3 (H) 04/17/2014   LDLCALC 64 01/09/2018   CREATININE 1.33 (H) 08/23/2019    .  [name] No results found for: EGFR  Current Barriers:  [name] Knowledge Deficits related to basic Diabetes pathophysiology and self care/management . Knowledge Deficits related to medications used for management of diabetes- patient reports blood sugars are meeting targets and he reports good medication taking behavior  Case Manager Clinical Goal(s):  Over the next 30-60  days, patient will demonstrate improved adherence to prescribed treatment plan for diabetes self care/management as evidenced by:  . daily monitoring and recording of CBG  . adherence to ADA/ carb modified diet . adherence to prescribed medication regimen  Interventions:  . Provided education to patient about basic DM disease process . Reviewed medications and assessed medication taking behavior . Discussed plans with  patient for ongoing care management follow up and provided patient with direct contact information for care management team . Advised patient, providing education and rationale, to check CBG at least daily and record, calling provider and/or CCM RN for findings outside established parameters.   . Review of patient status, including review of consultants reports, relevant laboratory and other test results, and medications completed.   Patient Self Care Activities:  . UNABLE to independently self manage DM . Self administers oral medications as prescribed . Self administers insulin as prescribed . Self administers injectable DM medication Xultophy as prescribed . Attends all scheduled provider appointments . Checks blood sugars as prescribed and utilize hyper and hypoglycemia protocol as needed . Adheres to prescribed ADA/carb modified  Please see past updates related to this goal by clicking on the "Past Updates" button in the selected goal            Other   .  Blood Pressure < 130/80           BP Readings from Last 3 Encounters:  01/11/20 128/77  12/13/19 (!) 138/80  12/12/19 (!) 134/84       Reviewed HTN self management strategies: . Take medications as prescribed . Monitor your blood pressure at home- patient states he is currently using a wrist monitor that a friend loaned him as the cuff on his arm machine is too small and the machine is too awkward to use . Follow a low salt diet- limit consumption of fast foods, packaged foods . Get regular exercise . Maintain a healthy weight . Manage stress      .  LDL CALC < 100        Lab Results  Component Value Date   CHOL 236 (H) 12/12/2019   HDL 28 (L) 12/12/2019   LDLCALC 174 (H) 12/12/2019   TRIG 182 (H) 12/12/2019   CHOLHDL 8.4 (H) 12/12/2019   Not meeting lipid goals so patient was started on statin therapy by cardiologist on 01/11/20        The care management team will reach out to the patient again over the  next 30-60 days.   [name] RN, CCM, [facility] RN Care Manager [phone]

## 2020-01-14 NOTE — Patient Instructions (Signed)
Visit Information It was nice speaking with you today. Goals Addressed              This Visit's Progress     Patient Stated   .  " I am doing much better now, I'm losing weight and I really like the PCS worker that came to my house today." (pt-stated)        [place] (see longitudinal plan of care for additional care plan information)  Current Barriers:  . Chronic Disease Management support, education, and care coordination needs related to HF, DM and HTN- patient reports he continues to  record his BP, Weight, pulse daily in his phone, he voices the correct parameters that require provider notification, he says he is pleased with his current PCS worker and that he receives services 5 times weekly  Clinical Goal(s) related to HF, DM and HTN: Over the next 30 - 60 days, patient will:  . Work with the care management team to address educational, disease management, and care coordination needs  . Begin or continue self health monitoring activities as directed today Measure and record CBG (blood glucose) at least one time daily and Measure and record blood pressure one time daily . Call provider office for new or worsened signs and symptoms Blood glucose findings outside established parameters, Blood pressure findings outside established parameters, Chest pain, Shortness of breath, and New or worsened symptom related to HF and DM II  and HTN . Call care management team with questions or concerns . Verbalize basic understanding of patient centered plan of care established today  Interventions related to HF, DM and HTN:  . Evaluation of current treatment plans and patient's adherence to plan as established by provider . Assessed patient understanding of disease states . Assessed patient's education and care coordination needs . Provided disease specific education to patient  . Collaborated with appropriate clinical care team members regarding patient needs . Again, positive  reinforcement provide to patient for taking daily vitals and recording them in his phone and taking responsibility for his health . Reviewed strategies to protect from Covid 19 infection   Patient Self Care Activities related to HF, DM and HTN:  . Patient is unable to independently self-manage chronic health conditions . Weighs daily and record (notifying MD of 3 lb weight gain over night or 5 lb in a week) . Takes Heart Failure Medications as prescribed . Verbalizes understanding of and follows CHF Action Plan . Adheres to low sodium diet  Please see past updates related to this goal by clicking on the "Past Updates" button in the selected goal      .  "[name] told me I have heart failure and he explained it to me real well." (pt-stated)        [facility] ENTRY (see longitudinal plan of care for additional care plan information)  Current Barriers:  [name] Knowledge deficit related to basic heart failure pathophysiology and self care management- patient states Dr Sallyanne Kuster explained heart failure to him during his office visit on 8/27 and that he prescribed atorvastatin and Entresto and stopped Imdur and he understands the importance of daily weights and was able to correctly identify the weight gain parameters that would require MD notification, he also says Dr Sallyanne Kuster wants him to meet with a pharmacist in his office to review his medication list  Nurse Case Manager Clinical Goal(s):   Over the next 30-60 days, patient will weigh self daily and record  Over  the next 30-60  days, patient will verbalize understanding of Heart Failure Action Plan and when to call doctor  Over the next 30-60 days, patient will take all Heart Failure mediations as prescribed  Interventions:  . Basic overview and discussion of pathophysiology of Heart Failure . Reviewed Dr. Sallyanne Kuster' s office  note of 01/11/20 with patient . Reviewed Heart Failure Action Plan in depth  . Assessed for scales in home . Discussed  importance of daily weight . Reviewed role of diuretics in prevention of fluid overload- discussed dual action of Jardiance in treating DM and HF  Patient Self Care Activities:  . Take Heart Failure Medications as prescribed . Weigh daily and record (notify MD with 3 lb weight gain over night or 5 lb in a week) . Follow CHF Action Plan . Adhere to low sodium diet  Initial goal documentation     .  "I want to get my hernia fixed but I need a referral to a surgeon." (pt-stated)        [place] (see longitudinal plan of care for additional care plan information)  Current Barriers:  [name] Knowledge Deficits related to securing referral to general surgeon - patient says he never received a call from [facility] about a surgical consult to assess his abdominal hernia  Nurse Case Manager Clinical Goal(s):  [name] Over the next 30-60 days, patient will work with CCM RN and  provider to address needs related to general surgeon consult for hernia repair  Interventions:  . Inter-disciplinary care team collaboration (see longitudinal plan of care) . Explained to patient that per the referral notes, his managed Medicaid health plan will consider [facility] ([facility]) to be in network beginning 01/16/20, at patient's request , will make a follow up call requesting [facility] reach out to patient to schedule consult    Patient Self Care Activities:  . Patient verbalizes understanding of plan to work with health care team to secure general surgeon consult.  . Unable to independently secure referral to general surgeon  Please see past updates related to this goal by clicking on the "Past Updates" button in the selected goal      .  "My blood sugar numbers look good now." (pt-stated)         [place] (see longitudinal plan of care for additional care plan information)  Objective:  Lab Results  Component Value Date   HGBA1C 10.1 (A) 12/12/2019   HGBA1C 12.7 (A) 07/12/2019    HGBA1C 7.4 (A) 01/31/2019   Lab Results  Component Value Date   MICROALBUR 11.3 (H) 04/17/2014   LDLCALC 64 01/09/2018   CREATININE 1.33 (H) 08/23/2019    .  [name] No results found for: EGFR  Current Barriers:  [name] Knowledge Deficits related to basic Diabetes pathophysiology and self care/management . Knowledge Deficits related to medications used for management of diabetes- patient reports blood sugars are meeting targets and he reports good medication taking behavior  Case Manager Clinical Goal(s):  Over the next 30-60  days, patient will demonstrate improved adherence to prescribed treatment plan for diabetes self care/management as evidenced by:  . daily monitoring and recording of CBG  . adherence to ADA/ carb modified diet . adherence to prescribed medication regimen  Interventions:  . Provided education to patient about basic DM disease process . Reviewed medications and assessed medication taking behavior . Discussed plans with patient for ongoing care management follow up and provided patient with direct contact  information for care management team . Advised patient, providing education and rationale, to check CBG at least daily and record, calling provider and/or CCM RN for findings outside established parameters.   . Review of patient status, including review of consultants reports, relevant laboratory and other test results, and medications completed.   Patient Self Care Activities:  . UNABLE to independently self manage DM . Self administers oral medications as prescribed . Self administers insulin as prescribed . Self administers injectable DM medication Xultophy as prescribed . Attends all scheduled provider appointments . Checks blood sugars as prescribed and utilize hyper and hypoglycemia protocol as needed . Adheres to prescribed ADA/carb modified  Please see past updates related to this goal by clicking on the "Past Updates" button in the selected goal              Other   .  Blood Pressure < 130/80           BP Readings from Last 3 Encounters:  01/11/20 128/77  12/13/19 (!) 138/80  12/12/19 (!) 134/84       Reviewed HTN self management strategies: . Take medications as prescribed . Monitor your blood pressure at home- patient states he is currently using a wrist monitor that a friend loaned him as the cuff on his arm machine is too small and the machine is too awkward to use . Follow a low salt diet- limit consumption of fast foods, packaged foods . Get regular exercise . Maintain a healthy weight . Manage stress      .  LDL CALC < 100        Lab Results  Component Value Date   CHOL 236 (H) 12/12/2019   HDL 28 (L) 12/12/2019   LDLCALC 174 (H) 12/12/2019   TRIG 182 (H) 12/12/2019   CHOLHDL 8.4 (H) 12/12/2019   Not meeting lipid goals so patient was started on statin therapy by cardiologist on 01/11/20       The patient verbalized understanding of instructions provided today and declined a print copy of patient instruction materials.   The care management team will reach out to the patient again over the next 30-60 days.   [name] RN, CCM, [facility] RN Care Manager [phone]

## 2020-01-14 NOTE — Progress Notes (Signed)
Internal Medicine Clinic Resident  I have personally reviewed this encounter including the documentation in this note and/or discussed this patient with the care management provider. I will address any urgent items identified by the care management provider and will communicate my actions to the patient's PCP. I have reviewed the patient's CCM visit with my supervising attending, Dr Heber Negley.  Virl Axe, MD 01/14/2020

## 2020-01-16 ENCOUNTER — Ambulatory Visit: Payer: Medicaid Other | Admitting: *Deleted

## 2020-01-16 DIAGNOSIS — I152 Hypertension secondary to endocrine disorders: Secondary | ICD-10-CM

## 2020-01-16 DIAGNOSIS — I5042 Chronic combined systolic (congestive) and diastolic (congestive) heart failure: Secondary | ICD-10-CM

## 2020-01-16 DIAGNOSIS — I4819 Other persistent atrial fibrillation: Secondary | ICD-10-CM

## 2020-01-16 DIAGNOSIS — E1142 Type 2 diabetes mellitus with diabetic polyneuropathy: Secondary | ICD-10-CM

## 2020-01-16 DIAGNOSIS — N183 Chronic kidney disease, stage 3 unspecified: Secondary | ICD-10-CM

## 2020-01-16 NOTE — Patient Instructions (Signed)
Visit Information  Goals Addressed              This Visit's Progress     Patient Stated   .  "I want to get my hernia fixed but I need a referral to a surgeon." (pt-stated)        [place] (see longitudinal plan of care for additional care plan information)  Current Barriers:  [name] Knowledge Deficits related to securing referral to general surgeon - spoke with patient via phone to follow up on surgical consult for abdominal hernia; patient voiced appreciation for this CCM RN's assistance and understanding and compliance of appointment  Nurse Case Manager Clinical Goal(s):  [name] Over the next 30-60 days, patient will work with CCM RN and  provider to address needs related to general surgeon consult for hernia repair  Interventions:  . Inter-disciplinary care team collaboration (see longitudinal plan of care) . Spoke with [facility] and scheduled patient's surgical consult with Dr Grandville Silos on 02/06/20 at 10:20. . Called patient and advised him of appointment and to arrive at 10:00 am. Provided him with name, address and phone number of [place] [facility] [facility] office.    Patient Self Care Activities:  . Patient verbalizes understanding of plan to work with health care team to secure general surgeon consult.  . Unable to independently secure referral to general surgeon  Please see past updates related to this goal by clicking on the "Past Updates" button in the selected goal         The patient verbalized understanding of instructions provided today and declined a print copy of patient instruction materials.   The care management team will reach out to the patient again over the next 30-60 days.   [name] RN, CCM, [facility] RN Care Manager [phone]

## 2020-01-16 NOTE — Chronic Care Management (AMB) (Signed)
°  Care Management   Follow Up Note   01/16/2020 Name: Terry Parks MRN: [id number] DOB: 04/26/1956  Referred by: [name], MD Reason for referral : Care Coordination (IDDM, HF, HTN, PAF, CKD)   Terry Parks is a 64 y.o. year old male who is a primary care patient of [name], MD. The care management team was consulted for assistance with care management and care coordination needs.    Review of patient status, including review of consultants reports, relevant laboratory and other test results, and collaboration with appropriate care team members and the patient's provider was performed as part of comprehensive patient evaluation and provision of chronic care management services.    SDOH (Social Determinants of Health) assessments performed: No See Care Plan activities for detailed interventions related to [facility])     Advanced Directives: See Care Plan and Vynca application for related entries.   Goals Addressed              This Visit's Progress     Patient Stated     "I want to get my hernia fixed but I need a referral to a surgeon." (pt-stated)        CARE PLAN ENTRY (see longitudinal plan of care for additional care plan information)  Current Barriers:   Knowledge Deficits related to securing referral to general surgeon - spoke with patient via phone to follow up on surgical consult for abdominal hernia; patient voiced appreciation for this CCM RN's assistance and understanding and compliance of appointment   Nurse Case Manager Clinical Goal(s):   Over the next 30-60 days, patient will work with CCM RN and  provider to address needs related to general surgeon consult for hernia repair  Interventions:   Inter-disciplinary care team collaboration (see longitudinal plan of care)  Spoke with [facility] and scheduled patient's surgical consult with Dr Grandville Silos on 02/06/20 at 10:20.  Called patient and advised him of appointment and to arrive  at 10:00 am. Provided him with name, address and phone number of [place] [facility] office.    Patient Self Care Activities:   Patient verbalizes understanding of plan to work with health care team to secure general surgeon consult.   Unable to independently secure referral to general surgeon  Please see past updates related to this goal by clicking on the "Past Updates" button in the selected goal          The care management team will reach out to the patient again over the next 30-60 days.   [name] RN, CCM, [facility] RN Care Manager [phone]

## 2020-01-17 NOTE — Progress Notes (Signed)
Internal Medicine Clinic Attending  CCM services provided by the care management provider and their documentation were discussed with Dr. Agyei. We reviewed the pertinent findings, urgent action items addressed by the resident and non-urgent items to be addressed by the PCP.  I agree with the assessment, diagnosis, and plan of care documented in the CCM and resident's note.  Katiana Ruland Thomas Gavriela Cashin, MD 01/17/2020  

## 2020-01-17 NOTE — Progress Notes (Signed)
Internal Medicine Clinic Resident  I have personally reviewed this encounter including the documentation in this note and/or discussed this patient with the care management provider. I will address any urgent items identified by the care management provider and will communicate my actions to the patient's PCP. I have reviewed the patient's CCM visit with my supervising attending, Dr Vincent.  Kailany Dinunzio K Kahlil Cowans, MD 01/17/2020   

## 2020-01-22 MED FILL — ACCU-CHEK GUIDE TEST STRIP: 33 days supply | Qty: 100 | Fill #2

## 2020-01-29 ENCOUNTER — Telehealth: Payer: Self-pay | Admitting: *Deleted

## 2020-01-29 NOTE — Telephone Encounter (Signed)
Prior auth for [employer] with CoverMyMeds: Key: [id number]

## 2020-01-29 NOTE — Telephone Encounter (Signed)
[place] [facility] [place] has reviewed the request for [employer] Tab 49- 51mg  submitted by [employer] on behalf of Terry Parks on 01/29/2020. After review, the request for service is: Approved through 01/28/2021.

## 2020-02-06 ENCOUNTER — Telehealth: Payer: Self-pay | Admitting: *Deleted

## 2020-02-06 MED FILL — TORSEMIDE 20 MG TABLET: 20 | 30 days supply | Qty: 150 | Fill #2

## 2020-02-06 MED FILL — OMEPRAZOLE 40 MG CPDR: 40 | 30 days supply | Qty: 60 | Fill #2

## 2020-02-06 NOTE — Telephone Encounter (Signed)
   [facility] Pre-operative Risk Assessment    HEARTCARE STAFF: - Please ensure there is not already an duplicate clearance open for this procedure. - Under Visit Info/Reason for Call, type in Other and utilize the format Clearance MM/DD/YY or Clearance TBD. Do not use dashes or single digits. - If request is for dental extraction, please clarify the # of teeth to be extracted.  Request for surgical clearance:  1. What type of surgery is being performed? Laparoscopic repair, incisional hernia with mesh  2. When is this surgery scheduled? TBD   3. What type of clearance is required (medical clearance vs. Pharmacy clearance to hold med vs. Both)? both  4. Are there any medications that need to be held prior to surgery and how long?xarelto-need instructions   5. Practice name and name of physician performing surgery? [facility]   6. What is the office phone number? [phone]   7.   What is the office fax number? [phone]  8.   Anesthesia type (None, local, MAC, general) ? general   Fredia Beets 02/06/2020, 3:55 PM  _________________________________________________________________   (provider comments below)

## 2020-02-06 NOTE — Telephone Encounter (Signed)
Patient with diagnosis of afib on Xarelto for anticoagulation.    Procedure: Laparoscopic repair, incisional hernia with mesh Date of procedure: TBD  CHADS2-VASc score of  4 (CHF, HTN, DM2, CAD)  CrCl 80 ml/min  Per office protocol, patient can hold Xarelto for 2 days prior to procedure.

## 2020-02-06 NOTE — Telephone Encounter (Signed)
   Primary Cardiologist: [name], MD  Chart reviewed as part of pre-operative protocol coverage. Patient was contacted 02/06/2020 in reference to pre-operative risk assessment for pending surgery as outlined below.  Trenten Watchman was last seen on 01/11/20 by Dr. Sallyanne Kuster.  The was doing well on cardiac stand point at the time of visit.   Therefore, based on ACC/AHA guidelines, the patient would be at acceptable risk for the planned procedure without further cardiovascular testing.   Pharmacy to review anticoagulation.   [place], [place] 02/06/2020, 4:14 PM

## 2020-02-07 ENCOUNTER — Ambulatory Visit (INDEPENDENT_AMBULATORY_CARE_PROVIDER_SITE_OTHER): Payer: Medicaid Other | Admitting: Pharmacist Clinician (PhC)/ Clinical Pharmacy Specialist

## 2020-02-07 ENCOUNTER — Other Ambulatory Visit: Payer: Self-pay | Admitting: Cardiovascular Disease

## 2020-02-07 ENCOUNTER — Other Ambulatory Visit: Payer: Self-pay

## 2020-02-07 VITALS — BP 160/92 | HR 90 | Temp 96.9°F | Resp 14 | Ht 73.0 in | Wt 326.6 lb

## 2020-02-07 DIAGNOSIS — I5042 Chronic combined systolic (congestive) and diastolic (congestive) heart failure: Secondary | ICD-10-CM

## 2020-02-07 DIAGNOSIS — I5023 Acute on chronic systolic (congestive) heart failure: Secondary | ICD-10-CM | POA: Diagnosis not present

## 2020-02-07 MED ORDER — ENTRESTO 97-103 MG PO TABS: 1.0000 | ORAL_TABLET | Freq: Two times a day (BID) | ORAL | 1 refills | Status: DC

## 2020-02-07 MED FILL — ENTRESTO 97 MG-103 MG TAB: 97-103 | 30 days supply | Qty: 60 | Fill #0

## 2020-02-07 NOTE — Patient Instructions (Signed)
If you have any questions or concerns please call us (Marisa Hufstetler/Raquel at [phone])  Go to the lab in 2 weeks (about first week of October)  Check your blood pressure at home daily (if able) and keep record of the readings.  Take your BP meds as follows:  Increase Entresto to 97/103 mg twice daily  Take torsemide 40 mg (2 x 20 mg tablets) once daily in the mornings.    Bring all of your meds, your BP cuff and your record of home blood pressures to your next appointment.  Exercise as youre able, try to walk approximately 30 minutes per day.  Keep salt intake to a minimum, especially watch canned and prepared boxed foods.  Eat more fresh fruits and vegetables and fewer canned items.  Avoid eating in fast food restaurants.    HOW TO TAKE YOUR BLOOD PRESSURE:  Rest 5 minutes before taking your blood pressure.   Dont smoke or drink caffeinated beverages for at least 30 minutes before.  Take your blood pressure before (not after) you eat.  Sit comfortably with your back supported and both feet on the floor (dont cross your legs).  Elevate your arm to heart level on a table or a desk.  Use the proper sized cuff. It should fit smoothly and snugly around your bare upper arm. There should be enough room to slip a fingertip under the cuff. The bottom edge of the cuff should be 1 inch above the crease of the elbow.  Ideally, take 3 measurements at one sitting and record the average.

## 2020-02-07 NOTE — Progress Notes (Signed)
02/12/2020 Terry Parks 11-Dec-1955 [id number]   HPI:  Terry Parks is a 64 y.o. male patient of Dr Sallyanne Kuster, with a PMH below who presents today for heart failure medication titration.  He was last seen by Dr. Sallyanne Kuster in August and was on losartan, carvedilol, spironolactone and empagliflozin.  He was doing well on meds and blood pressure was stable, so was switched from losartan 100 mg to Entresto 49/51 mg bid.    He returns today for follow up.  Has had no concerns with the switch to [facility], and is now on guideline directed medical therapy (ARNI/beta blocker/spironolactone/SGLT).  He notes that his BP has increased especially for 24-48 hours after eating sausage.    Past Medical History: PAF On amiodarone, carvedilol and rivaroxaban  hyperlipidemia 7/21 TC 236, TG 182, HDL 28, LDL 174 - On atorvastatin 40  CHF Combined - EF 35-40% (02/2019),   OSA On CPAP  DM2 A1c 10.1  w/retinopathy and nephropathy - on empagliflozin, Xultophy, metformin     Blood Pressure Goal:  130/80  Current Medications: carvedilol 25 mg bid, empagliflozin 10 mg qd, Entresto 49/51 mg bid, spironolactone 50 mg qd  Family Hx: mother in her 13's, father died when pt 63; dm, htn in siblings (3 of 5)  Social Hx: no tobacco, no alcohol  Diet: has gone back to soda - tries to drink "clear" but likes Pepsi, Mt, Dew; Appetite crazy recently, admits to being off track on his diet -  ginger snaps, sausage; mostly chicken - prefers fried from [employer];   Exercise: steps in apartment and buiding;   Home BP readings: none with him today  Intolerances: lisinopril cough  Labs: 8/21: Na 140, K 4.7, Glu 135, BUN 8, SCr 1.43 GFR 60  Wt Readings from Last 3 Encounters:  02/07/20 (!) 326 lb 9.6 oz (148.1 kg)  01/11/20 (!) 322 lb 9.6 oz (146.3 kg)  12/13/19 (!) 314 lb (142.4 kg)   BP Readings from Last 3 Encounters:  02/07/20 (!) 160/92  01/11/20 128/77  12/13/19 (!) 138/80   Pulse Readings from  Last 3 Encounters:  02/07/20 90  01/11/20 87  12/13/19 84    Current Outpatient Medications  Medication Sig Dispense Refill  . Accu-Chek Softclix Lancets lancets Check blood sugar three times a day as instructed 300 each 3  . acetaminophen (TYLENOL) 325 MG tablet Take 325-650 mg by mouth every 6 (six) hours as needed (for pain).    [name] amiodarone (PACERONE) 200 MG tablet TAKE 1 TABLET BY MOUTH DAILY. 30 tablet 1  . atorvastatin (LIPITOR) 40 MG tablet Take 1 tablet (40 mg total) by mouth daily. 90 tablet 3  . Blood Glucose Monitoring Suppl (ACCU-CHEK AVIVA) device Use as instructed to check blood sugar up to 3 times a day 1 each 0  . carvedilol (COREG) 25 MG tablet Take 1 tablet (25 mg total) by mouth 2 (two) times daily with a meal. 180 tablet 1  . empagliflozin (JARDIANCE) 10 MG TABS tablet Take 1 tablet (10 mg total) by mouth daily before breakfast. 30 tablet 2  . 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 insulin dependent 300 each 11  . Insulin Degludec-Liraglutide (XULTOPHY) 100-3.6 UNIT-MG/ML SOPN Inject 50 Units into the skin daily. 5 pen 11  . Insulin Pen Needle (CARETOUCH PEN NEEDLES) 31G X 6 MM MISC 1 pen by Does not apply route at bedtime. 100 each 3  .  metFORMIN (GLUCOPHAGE XR) 500 MG 24 hr tablet Take 4 tablets (2,000 mg total) by mouth daily with breakfast. 360 tablet 3  . nystatin (NYSTATIN) powder Apply 1 application topically 3 (three) times daily. 60 g 1  . omeprazole (PRILOSEC) 40 MG capsule Take 1 capsule (40 mg total) by mouth 2 (two) times daily before a meal. 60 capsule 3  . rivaroxaban (XARELTO) 20 MG TABS tablet Take 1 tablet (20 mg total) by mouth daily with breakfast. 90 tablet 0  . spironolactone (ALDACTONE) 50 MG tablet Take 1 tablet (50 mg total) by mouth daily. 30 tablet 0  . torsemide (DEMADEX) 20 MG tablet Take 2 tablets (40 mg total) by mouth daily. 180 tablet 0  . sacubitril-valsartan (ENTRESTO) 97-103  MG Take 1 tablet by mouth 2 (two) times daily. 180 tablet 1   No current facility-administered medications for this visit.    Allergies  Allergen Reactions  . Lisinopril Cough    Past Medical History:  Diagnosis Date  . Abscessed tooth    top back large cavity no pain or drainage, one on bottom  pt pulled tooth 4-5 months ago, right top large hole in tooth  . AKI (acute kidney injury) ([place])   . Allergic rhinitis   . Anemia   . Anxiety   . Asthma   . Atrial fibrillation ([place])   . BPH (benign prostatic hypertrophy)    Massive BPH noted on cystoscopy 1/23/ 2012 by Dr. Risa Grill.  . Cancer [facility])    prostate cancer 2019  . Cardiomyopathy ([place])   . CHF (congestive heart failure) ([place])   . Cough 03/30/2012  . Depression   . Diabetes mellitus 04/08/2008   type 2  . Foley catheter in place 07-05-17 placed  . Headache(784.0)    hx migraines none recent  . Hyperlipemia   . Hypertension   . Hypertensive cardiopathy 03/01/2006   2-D echocardiogram 02/01/2012 showed moderate LVH, mildly to moderately reduced left ventricular systolic function with an estimated ejection fraction of 40-45%, and diffuse hypokinesis.  A nuclear medicine stress study done 01/31/2012 showed no reversible ischemia, a small mid anterior wall fixed defect/infarct, and ejection fraction 42%.      . Neck pain   . Nephrolithiasis 05/29/2010   CT scan of abdomen/pelvis on 05/29/2010 showed an obstructing approximate 1-2 mm calculus at the left UVJ, and an approximate 1-2 mm left lower pole renal calculus.   Patient had continuing severe pain , and an elevation of his serum creatinine to a value of 1.75 on 06/06/2010.  Patient underwent cystoscopy on 06/08/2010 by Dr. Risa Grill, but attempts at retrograde pyelogram and ureteroscopy were unsucc  . Numbness 01/08/2018  . Obstructive sleep apnea 03/06/2008   Sleep study 03/06/08 showed severe OSA/hypopnea syndrome, with successful CPAP titration to 13 CWP using a medium ResMed Mirage  Quattro full face mask with heated humidifier.   . Rash 04/17/2014  . Renal calculus 05/29/2010   CT scan of abdomen/pelvis on 05/29/2010 showed an obstructing approximate 1-2 mm calculus at the left UVJ, and an approximate 1-2 mm left lower pole renal calculus.   Patient had continuing severe pain , and an elevation of his serum creatinine to a value of 1.75 on 06/06/2010.  The stone had apparently passed and was not seen on repeat CT 06/08/2010.  . Sleep apnea    haven't use cpap in 2 years  . Tooth pain 10/29/2017  . Urinary straining 11/02/2016    Blood pressure (!) 160/92, pulse 90,  temperature (!) 96.9 F (36.1 C), resp. rate 14, height 6\' 1"  (1.854 m), weight (!) 326 lb 9.6 oz (148.1 kg), SpO2 92 %.  Acute on chronic HFrEF (heart failure with reduced ejection fraction) ([place]) Patient with HFrEF, most recently to be at 35-40%.  Will increase Entresto to 97/103 mg twice daily.  He will need to go to the lab in about 2 weeks to repeat metabolic panel.  This puts him on full doses of guideline directed therapy.  He is to continue monitoring his blood pressure at home and call should he note any symptoms of hypotension.  Patient scheduled to see Dr. Sallyanne Kuster in December, but we can see him prior to that should any concerns arise.     Tommy Medal PharmD CPP [facility] [address] [place], [place] [address] [phone]

## 2020-02-12 NOTE — Assessment & Plan Note (Signed)
Patient with HFrEF, most recently to be at 35-40%.  Will increase Entresto to 97/103 mg twice daily.  He will need to go to the lab in about 2 weeks to repeat metabolic panel.  This puts him on full doses of guideline directed therapy.  He is to continue monitoring his blood pressure at home and call should he note any symptoms of hypotension.  Patient scheduled to see Dr. Sallyanne Kuster in December, but we can see him prior to that should any concerns arise.

## 2020-02-13 ENCOUNTER — Telehealth: Payer: Medicaid Other

## 2020-02-13 ENCOUNTER — Telehealth: Payer: Self-pay | Admitting: *Deleted

## 2020-02-13 NOTE — Telephone Encounter (Signed)
°  Care Management   Outreach Note  02/13/2020 Name: Terry Parks MRN: [id number] DOB: 03-Sep-1955  Referred by: [name], MD Reason for referral : Care Coordination (IDDM, HF, HTN, PAF, CKD)   An unsuccessful telephone outreach was attempted today. Returning call to patient after he left voice message for this CCM RN on 02/12/20 at 1:33 pm. The patient was referred to the case management team for assistance with care management and care coordination.   Follow Up Plan: A HIPAA compliant phone message was left for the patient providing contact information and requesting a return call.  The care management team will reach out to the patient again over the next 30-60 days.   [name] RN, CCM, [facility] RN Care Manager [phone]

## 2020-02-14 ENCOUNTER — Telehealth: Payer: Medicaid Other

## 2020-02-14 ENCOUNTER — Telehealth: Payer: Self-pay | Admitting: *Deleted

## 2020-02-14 NOTE — Telephone Encounter (Signed)
  Care Management   Outreach Note  02/14/2020 Name: Terry Parks MRN: [id number] DOB: 05-03-1956  Referred by: [name], MD Reason for referral : Care Coordination (IDDM, HF, HTN, PAF, CKD)   A second unsuccessful telephone outreach was attempted today. The patient was referred to the case management team for assistance with care management and care coordination.   Follow Up Plan: A HIPAA compliant phone message was left for the patient providing contact information and requesting a return call.  The care management team will reach out to the patient again over the next 7-10 days.   [name] RN, CCM, [facility] RN Care Manager [phone]

## 2020-02-17 IMAGING — RF DG SINUS / FISTULA TRACT / ABSCESSOGRAM
6 series · 14 of 16 positions shown · IV contrast (omnipaque)
Comparison: None.

MEDICATIONS:
None

ANESTHESIA/SEDATION:
None

COMPLICATIONS:
None immediate.

INDICATION: 62-year-old with history of prostatectomy and anterior pelvic
abscess. Percutaneous drainage catheter was placed on 01/14/2018.
Patient reports minimal output from the drain. CT from today showed
resolution of the abscess. Plan for drain injection and probable
drain removal.

EXAM:
SINUS TRACT INJECTION / FISTULOGRAM
TECHNIQUE: Patient was placed supine on the interventional table. Fluoroscopic
images were obtained during drain injection.
PROCEDURE:
10 mL Omnipaque 300 was injected through the drain under
fluoroscopic guidance. Contrast was aspirated. The catheter was cut
and removed without complication. Bandage was placed at the old
drain site.

[Series 1: sequence · 3 of 40 frames shown (1 of 3)]
[frame 7/40]
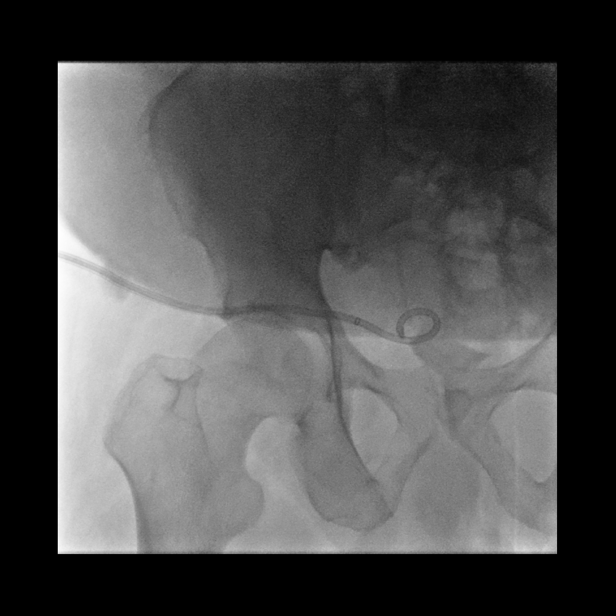
[frame 21/40]
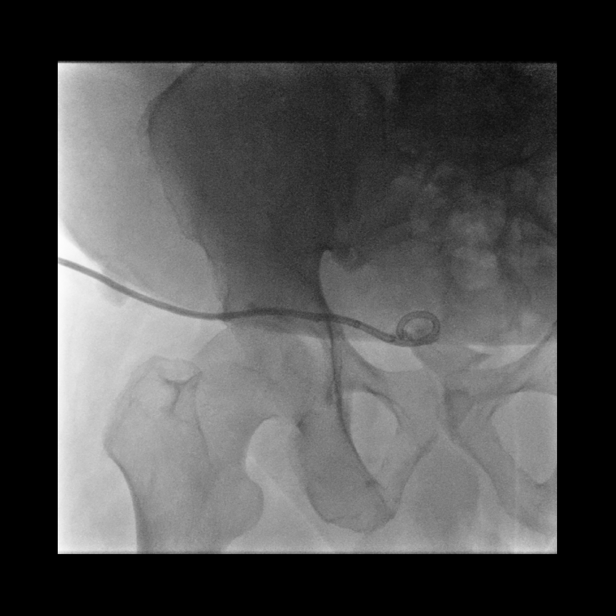
[frame 30/40]
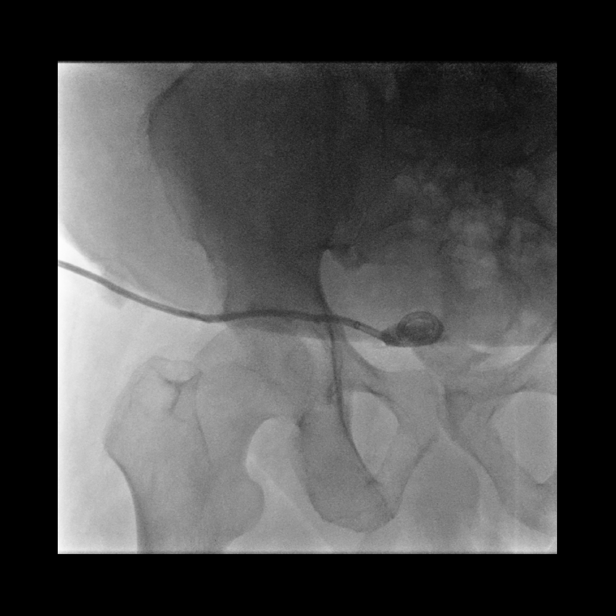

[Series 2: one shot · 1 of 1 slices shown (1 of 3)]
[im 1/1]
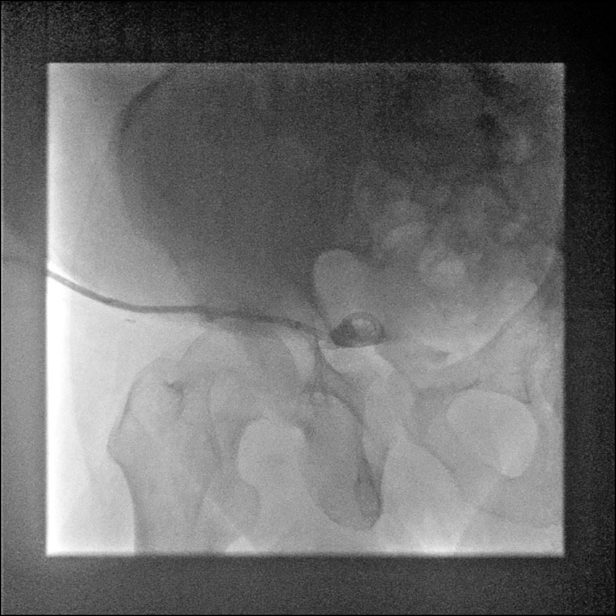

[Series 3: sequence · 2 of 2 frames shown (2 of 3)]
[frame 1/2]
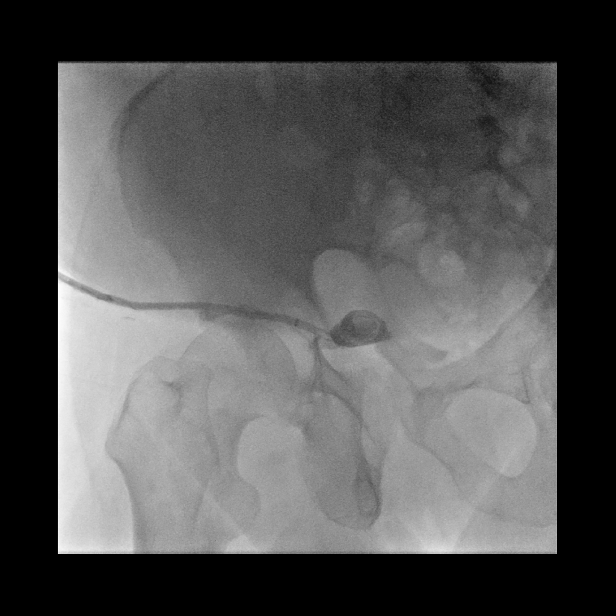
[frame 2/2]
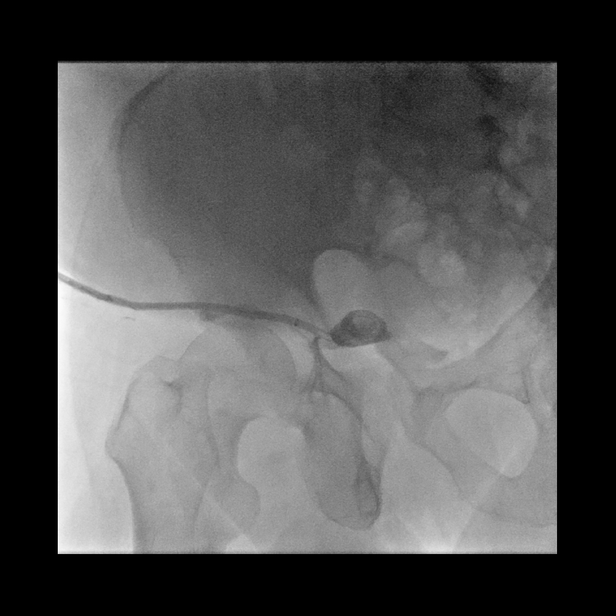

[Series 4: one shot · 1 of 1 slices shown (2 of 3)]
[im 1/1]
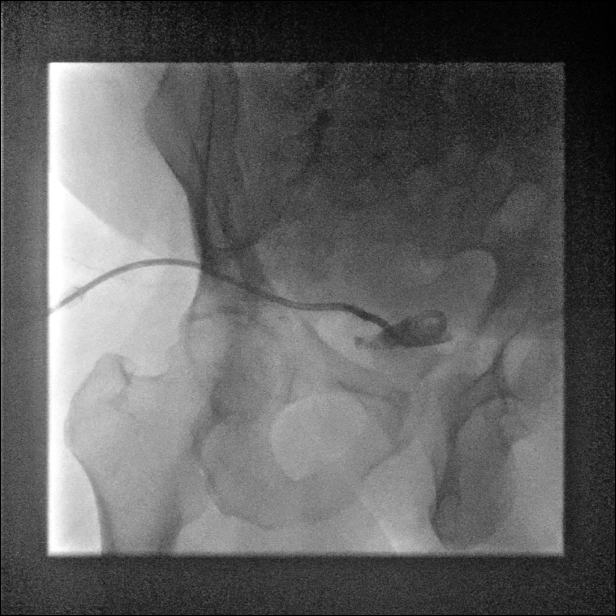

[Series 5: sequence · 3 of 15 frames shown (3 of 3)]
[frame 3/15]
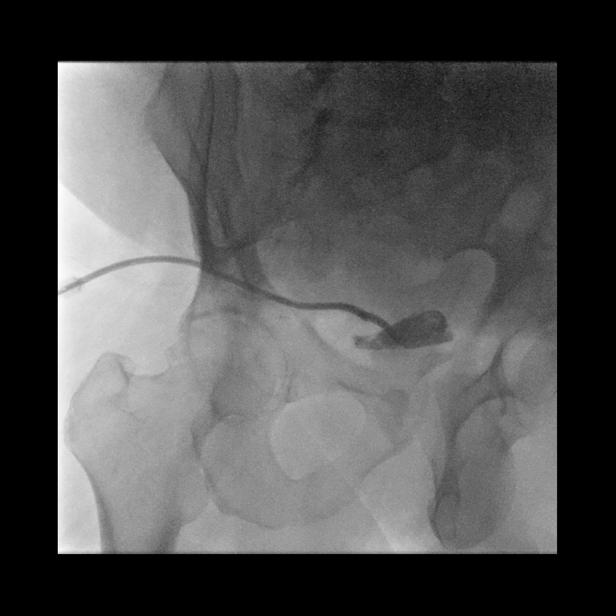
[frame 6/15]
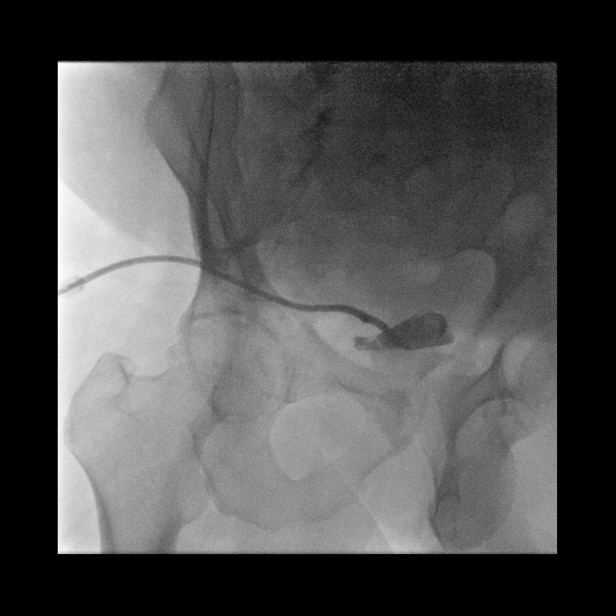
[frame 8/15]
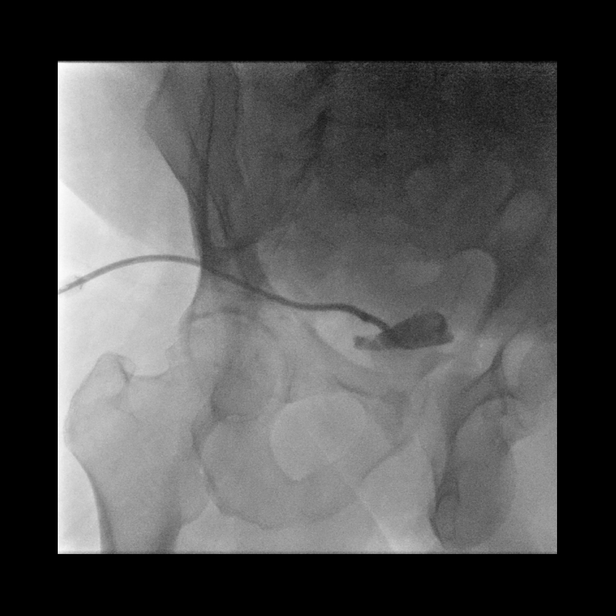

[Series 6: one shot · 4 of 4 slices shown (3 of 3)]
[im 1/4]
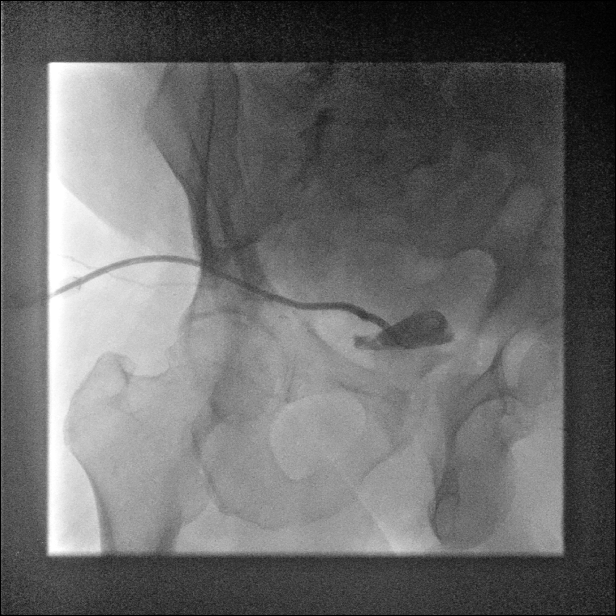
[im 2/4]
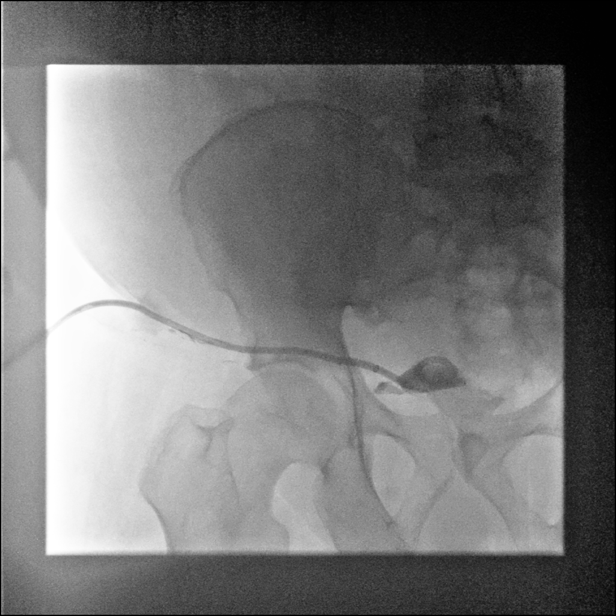
[im 3/4]
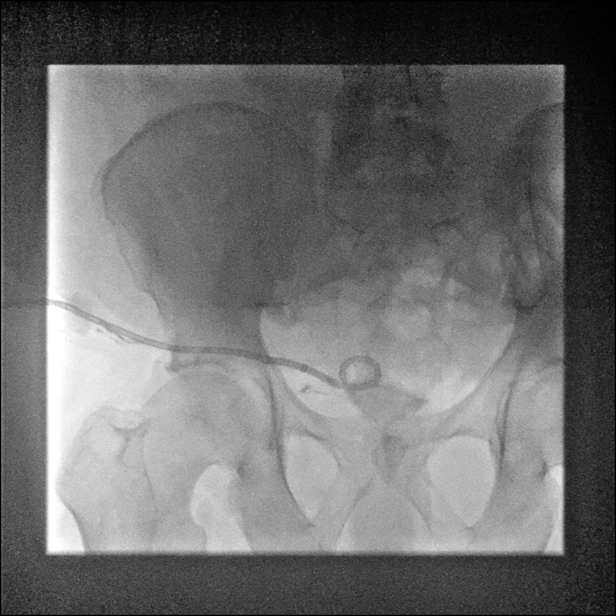
[im 4/4]
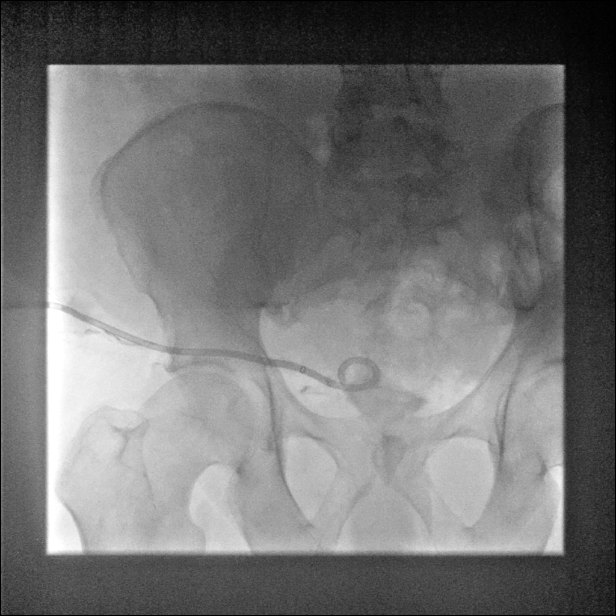

[14 of 16 positions shown; findings below may reference images not displayed]

FINDINGS: Pigtail catheter in the anterior pelvis. The abscess collection is
decompressed and contrast was refluxing along the drain tract. No
evidence for a bowel or bladder fistula tract.
IMPRESSION: Abscess cavity is decompressed and there is no evidence for a
fistula tract.

Drainage catheter was removed.

## 2020-02-18 ENCOUNTER — Ambulatory Visit: Payer: Medicaid Other | Admitting: *Deleted

## 2020-02-18 DIAGNOSIS — N183 Chronic kidney disease, stage 3 unspecified: Secondary | ICD-10-CM

## 2020-02-18 DIAGNOSIS — I4819 Other persistent atrial fibrillation: Secondary | ICD-10-CM

## 2020-02-18 DIAGNOSIS — I5042 Chronic combined systolic (congestive) and diastolic (congestive) heart failure: Secondary | ICD-10-CM

## 2020-02-18 DIAGNOSIS — E1142 Type 2 diabetes mellitus with diabetic polyneuropathy: Secondary | ICD-10-CM

## 2020-02-18 NOTE — Chronic Care Management (AMB) (Signed)
  Care Management   Note  02/18/2020 Name: Dyon Rotert MRN: [id number] DOB: Nov 30, 1955  Patient left voice message for this CCM RN stating he needs help with making an appointment with an eye doctor and stating he has no energy and is tired of not feeling well.   Telephone follow up appointment with care management team member scheduled for: 02/19/20.  [name] RN, CCM, [facility] RN Care Manager [phone]

## 2020-02-19 ENCOUNTER — Ambulatory Visit: Payer: Self-pay | Admitting: General Surgery

## 2020-02-19 ENCOUNTER — Encounter: Payer: Self-pay | Admitting: *Deleted

## 2020-02-19 ENCOUNTER — Ambulatory Visit: Payer: Medicaid Other | Admitting: *Deleted

## 2020-02-19 ENCOUNTER — Telehealth: Payer: Self-pay | Admitting: *Deleted

## 2020-02-19 DIAGNOSIS — I4819 Other persistent atrial fibrillation: Secondary | ICD-10-CM

## 2020-02-19 DIAGNOSIS — E1142 Type 2 diabetes mellitus with diabetic polyneuropathy: Secondary | ICD-10-CM

## 2020-02-19 DIAGNOSIS — I5042 Chronic combined systolic (congestive) and diastolic (congestive) heart failure: Secondary | ICD-10-CM

## 2020-02-19 DIAGNOSIS — E1159 Type 2 diabetes mellitus with other circulatory complications: Secondary | ICD-10-CM

## 2020-02-19 DIAGNOSIS — N183 Chronic kidney disease, stage 3 unspecified: Secondary | ICD-10-CM

## 2020-02-19 NOTE — Progress Notes (Signed)
Internal Medicine Clinic Resident  I have personally reviewed this encounter including the documentation in this note and/or discussed this patient with the care management provider. I will address any urgent items identified by the care management provider and will communicate my actions to the patient's PCP. I have reviewed the patient's CCM visit with my supervising attending, Dr Evette Doffing.  Harvie Heck, MD  [facility] PGY-2 02/19/2020

## 2020-02-19 NOTE — Patient Instructions (Signed)
Visit Information It was nice speaking with you today. Goals Addressed              This Visit's Progress     Patient Stated   .  " I have been feeling really fatigued lately, my legs are hurting and cramping again. I think I may need to see a specialist in [facility]." (pt-stated)        [place] (see longitudinal plan of care for additional care plan information)  Current Barriers:  . Chronic Disease Management support, education, and care coordination needs related to HF, DM and HTN- patient voiced c/o fatigue, leg pain and cramping, frustration with ongoing urinary incontinence that requires adult incontinence briefs, he says he has been speaking to a friend that is in the medical field and she has suggested he go to a specialist in [facility] , he reports he continues to  record his BP, Weight, pulse daily in his phone, he voices the correct parameters that require provider notification, he says he is pleased with his current [facility] worker and that he receives services 5 times weekly  Clinical Goal(s) related to HF, DM and HTN: Over the next 30 - 60 days, patient will:  . Work with the care management team to address educational, disease management, and care coordination needs  . Begin or continue self health monitoring activities as directed today Measure and record CBG (blood glucose) at least one time daily and Measure and record blood pressure one time daily . Call provider office for new or worsened signs and symptoms Blood glucose findings outside established parameters, Blood pressure findings outside established parameters, Chest pain, Shortness of breath, and New or worsened symptom related to HF and DM II  and HTN . Call care management team with questions or concerns . Verbalize basic understanding of patient centered plan of care established today  Interventions related to HF, DM and HTN:  . Evaluation of current treatment plans and patient's adherence to plan as  established by provider . Assessed patient understanding of disease states . Assessed patient's education and care coordination needs . Provided disease specific education to patient  . Collaborated with appropriate clinical care team members regarding patient needs . Again, positive reinforcement provide to patient for taking daily vitals and recording them in his phone and taking responsibility for his health . Provided time for patient to express his frustration related to his chronic issues and suggested he might get help with his leg pain if he saw a neurologist . Reviewed scheduled/upcoming provider appointments including appointment for preoperative lab work and with his cardiologist in December .    Patient Self Care Activities related to HF, DM and HTN:  . Patient is unable to independently self-manage chronic health conditions . Weighs daily and record (notifying MD of 3 lb weight gain over night or 5 lb in a week) . Takes Heart Failure Medications as prescribed . Verbalizes understanding of and follows CHF Action Plan . Adheres to low sodium diet  Please see past updates related to this goal by clicking on the "Past Updates" button in the selected goal      .  " I need to find out the name of the eye doctor I was referred to when I fell and hurt my eye so I can have my eyes checked. " (pt-stated)        [place] (see longitudinal plan of care for additional care plan information)  Current Barriers:  .  Care Coordination needs related to assistance with scheduling eye exam in a patient with  IDDM, HF, HTN, PAF, CKD  Nurse Case Manager Clinical Goal(s):  [name] Over the next 30-60  days, patient will schedule and complete eye exam appointment.  Interventions:  . Inter-disciplinary care team collaboration (see longitudinal plan of care) . Advised patient that he previously saw ophthalmologist Dr Jola Schmidt and provided patient with office phone number so that he can  schedule the appointment.  . Added Dr Valetta Close to patient's  care team in [place].  [name] Ensured patient knows how to schedule Medicaid transportation for the appointment.   Patient Self Care Activities:  . Patient verbalizes understanding of plan to schedule eye appointment  . Self administers medications as prescribed . Attends all scheduled provider appointments . Calls pharmacy for medication refills . Calls provider office for new concerns or questions . Patient schedules and completes annual eye exam appointment  . Unable to independently determine ophthalmologist  patient established care with after his fall in December 2020.   Initial goal documentation     .  COMPLETED: "I want to get my hernia fixed but I need a referral to a surgeon." (pt-stated)        [place] (see longitudinal plan of care for additional care plan information)  Current Barriers:  [name] Knowledge Deficits related to securing referral to general surgeon - spoke with patient via phone to follow up on surgical consult for abdominal hernia; patient states he completed his consultation with general surgeon Dr. Georganna Skeans and will have his hernia repaired in November  Nurse Case Manager Clinical Goal(s):  [name] Over the next 30-60 days, patient will work with CCM RN and  provider to address needs related to general surgeon consult for hernia repair  Interventions:  . Inter-disciplinary care team collaboration (see longitudinal plan of care) . Determined status of surgical consult . Provided positive feedback to patient for completing consultation     Patient Self Care Activities:  . Patient verbalizes understanding of plan to work with health care team to secure general surgeon consult.  . Unable to independently secure referral to general surgeon  Please see past updates related to this goal by clicking on the "Past Updates" button in the selected goal         The patient verbalized understanding of  instructions provided today and declined a print copy of patient instruction materials.   The care management team will reach out to the patient again over the next 30-60 days.   [name] RN, CCM, [facility] RN Care Manager [phone]

## 2020-02-19 NOTE — Chronic Care Management (AMB) (Signed)
Care Management   Follow Up Note   02/19/2020 Name: Terry Parks MRN: [id number] DOB: [birth year]  Referred by: [name], MD Reason for referral : Care Coordination (IDDM, HF, HTN, PAF, CKD)   Terry Parks is a 64 y.o. year old male who is a primary care patient of [name], MD. The care management team was consulted for assistance with care management and care coordination needs.    Review of patient status, including review of consultants reports, relevant laboratory and other test results, and collaboration with appropriate care team members and the patient's provider was performed as part of comprehensive patient evaluation and provision of chronic care management services.    SDOH (Social Determinants of Health) assessments performed: No See Care Plan activities for detailed interventions related to [facility])     Advanced Directives: See Care Plan and Vynca application for related entries.   Goals Addressed              This Visit's Progress     Patient Stated   .  " I have been feeling really fatigued lately, my legs are hurting and cramping again. I think I may need to see a specialist in [facility]." (pt-stated)        [place] (see longitudinal plan of care for additional care plan information)  Current Barriers:  . Chronic Disease Management support, education, and care coordination needs related to HF, DM and HTN- patient voiced c/o fatigue, leg pain and cramping, frustration with ongoing urinary incontinence that requires adult incontinence briefs, he says he has been speaking to a friend that is in the medical field and she has suggested he go to a specialist in [facility] , he reports he continues to  record his BP, Weight, pulse daily in his phone, he voices the correct parameters that require provider notification, he says he is pleased with his current PCS worker and that he receives services 5 times weekly  Clinical Goal(s) related to  HF, DM and HTN: Over the next 30 - 60 days, patient will:  . Work with the care management team to address educational, disease management, and care coordination needs  . Begin or continue self health monitoring activities as directed today Measure and record CBG (blood glucose) at least one time daily and Measure and record blood pressure one time daily . Call provider office for new or worsened signs and symptoms Blood glucose findings outside established parameters, Blood pressure findings outside established parameters, Chest pain, Shortness of breath, and New or worsened symptom related to HF and DM II  and HTN . Call care management team with questions or concerns . Verbalize basic understanding of patient centered plan of care established today  Interventions related to HF, DM and HTN:  . Evaluation of current treatment plans and patient's adherence to plan as established by provider . Assessed patient understanding of disease states . Assessed patient's education and care coordination needs . Provided disease specific education to patient  . Collaborated with appropriate clinical care team members regarding patient needs . Again, positive reinforcement provide to patient for taking daily vitals and recording them in his phone and taking responsibility for his health . Provided time for patient to express his frustration related to his chronic issues and suggested he might get help with his leg pain if he saw a neurologist . Reviewed scheduled/upcoming provider appointments including appointment for preoperative lab work and with his cardiologist in December .    Patient Self  Care Activities related to HF, DM and HTN:  . Patient is unable to independently self-manage chronic health conditions . Weighs daily and record (notifying MD of 3 lb weight gain over night or 5 lb in a week) . Takes Heart Failure Medications as prescribed . Verbalizes understanding of and follows CHF Action  Plan . Adheres to low sodium diet  Please see past updates related to this goal by clicking on the "Past Updates" button in the selected goal      .  " I need to find out the name of the eye doctor I was referred to when I fell and hurt my eye so I can have my eyes checked. " (pt-stated)        [place] (see longitudinal plan of care for additional care plan information)  Current Barriers:  . Care Coordination needs related to assistance with scheduling eye exam in a patient with  IDDM, HF, HTN, PAF, CKD  Nurse Case Manager Clinical Goal(s):  [name] Over the next 30-60  days, patient will schedule and complete eye exam appointment.  Interventions:  . Inter-disciplinary care team collaboration (see longitudinal plan of care) . Advised patient that he previously saw ophthalmologist Dr Jola Schmidt and provided patient with office phone number so that he can schedule the appointment.  . Added Dr Valetta Close to patient's  care team in [place].  [name] Ensured patient knows how to schedule Medicaid transportation for the appointment.   Patient Self Care Activities:  . Patient verbalizes understanding of plan to schedule eye appointment  . Self administers medications as prescribed . Attends all scheduled provider appointments . Calls pharmacy for medication refills . Calls provider office for new concerns or questions . Patient schedules and completes annual eye exam appointment  . Unable to independently determine ophthalmologist  patient established care with after his fall in December 2020.   Initial goal documentation     .  COMPLETED: "I want to get my hernia fixed but I need a referral to a surgeon." (pt-stated)        [place] (see longitudinal plan of care for additional care plan information)  Current Barriers:  [name] Knowledge Deficits related to securing referral to general surgeon - spoke with patient via phone to follow up on surgical consult for abdominal hernia; patient states  he completed his consultation with general surgeon Dr. Georganna Skeans and will have his hernia repaired in November  Nurse Case Manager Clinical Goal(s):  [name] Over the next 30-60 days, patient will work with CCM RN and  provider to address needs related to general surgeon consult for hernia repair  Interventions:  . Inter-disciplinary care team collaboration (see longitudinal plan of care) . Determined status of surgical consult . Provided positive feedback to patient for completing consultation     Patient Self Care Activities:  . Patient verbalizes understanding of plan to work with health care team to secure general surgeon consult.  . Unable to independently secure referral to general surgeon  Please see past updates related to this goal by clicking on the "Past Updates" button in the selected goal          The care management team will reach out to the patient again over the next 30-60 days.   [name] RN, CCM, [facility] RN Care Manager [phone]

## 2020-02-19 NOTE — Telephone Encounter (Signed)
SPOKE WITH PATIENT REGARD HIM BEING SEEN BY ANOTHER EYE DR AND THAT WE NEED NOTES. PATIENT HAS NO CLUE AS TO WHOM HE SAW.  CALLED [facility] OFFICE. NO INFORMATION ON SENDING HIM TO ANOTHER EYE DR.

## 2020-02-20 NOTE — Progress Notes (Signed)
Internal Medicine Clinic Attending  CCM services provided by the care management provider and their documentation were discussed with Dr. Marva Panda. We reviewed the pertinent findings, urgent action items addressed by the resident and non-urgent items to be addressed by the PCP.  I agree with the assessment, diagnosis, and plan of care documented in the CCM and resident's note.  Axel Filler, MD 02/20/2020

## 2020-02-26 ENCOUNTER — Other Ambulatory Visit (HOSPITAL_COMMUNITY): Payer: Medicaid Other

## 2020-03-03 LAB — HM DIABETES EYE EXAM

## 2020-03-11 ENCOUNTER — Encounter: Payer: Self-pay | Admitting: Dietician

## 2020-03-11 ENCOUNTER — Telehealth: Payer: Self-pay | Admitting: Dietician

## 2020-03-11 NOTE — Telephone Encounter (Signed)
I called [facility] to clarify report that was sent for date of visit 03-03-20. They are refaxing and say his fundus exam showed background diabetic retinopathy, but only requires monitoring

## 2020-03-18 NOTE — Pre-Procedure Instructions (Signed)
[place], [place] - [address] [facility]. [address] [place] [address] Phone: [phone] Fax: [place], [place] - [address] [place] [address] Phone: [phone] Fax: [phone]    Your procedure is scheduled on Mon., Nov. 8, 2021 from 9:00AM-11:00AM  Report to [facility] Entrance "A" at 7:00AM  Call this number if you have problems the morning of surgery:  [phone]   Remember:  Do not eat after midnight on Nov. 7th  You may drink clear liquids until 3 hours (6:00AM) prior to surgery time .  Clear liquids allowed are:  Water, Juice (non-citric and without pulp - diabetics please choose diet or no sugar options), Carbonated beverages - (diabetics please choose diet or no sugar options), Clear Tea, Black Coffee only (no creamer, milk or cream including half and half), Plain Jell-O only (diabetics please choose diet or no sugar options), Gatorade (diabetics please choose diet or no sugar options) and Plain Popsicles only    Take these medicines the morning of surgery with A SIP OF WATER: Amiodarone (PACERONE)     Atorvastatin (LIPITOR)  Carvedilol (COREG) Omeprazole (PRILOSEC)  Follow your surgeon's instructions on when to stop Xarelto.  If no instructions were given by your surgeon then you will need to call the office to get those instructions.    As of today, STOP taking all Aspirin (unless instructed by your doctor) and Other Aspirin containing products, Vitamins, Fish oils, and Herbal medications. Also stop all NSAIDS i.e. Advil, Ibuprofen, Motrin, Aleve, Anaprox, Naproxen, BC, Goody Powders, and all Supplements.   WHAT DO I DO ABOUT MY DIABETES MEDICATION?  [name] Take last doses of Empagliflozin (JARDIANCE) and Insulin Degludec-Liraglutide (XULTOPHY) on Saturday 03/22/20  . Do not take MetFORMIN (GLUCOPHAGE XR) the morning of  surgery.   How to Manage Your Diabetes Before and After Surgery  Why is it important to control my blood sugar before and after surgery? . Improving blood sugar levels before and after surgery helps healing and can limit problems. . A way of improving blood sugar control is eating a healthy diet by: o  Eating less sugar and carbohydrates o  Increasing activity/exercise o  Talking with your doctor about reaching your blood sugar goals . High blood sugars (greater than 180 mg/dL) can raise your risk of infections and slow your recovery, so you will need to focus on controlling your diabetes during the weeks before surgery. . Make sure that the doctor who takes care of your diabetes knows about your planned surgery including the date and location.  How do I manage my blood sugar before surgery? . Check your blood sugar at least 4 times a day, starting 2 days before surgery, to make sure that the level is not too high or low. o Check your blood sugar the morning of your surgery when you wake up and every 2 hours until you get to the Short Stay unit. . If your blood sugar is less than 70 mg/dL, you will need to treat for low blood sugar: o Do not take insulin. o Treat a low blood sugar (less than 70 mg/dL) with  cup of clear juice (cranberry or apple), 4 glucose tablets, OR glucose gel. Recheck blood sugar in 15 minutes after treatment (to make sure it is greater than 70 mg/dL). If your blood sugar is not greater than 70 mg/dL on recheck, call [phone] o  for further instructions.                                 . If your CBG is greater than 220 mg/dL, inform the staff upon arrival to Short Stay.  . If you are admitted to the hospital after surgery: o Your blood sugar will be checked by the staff and you will probably be given insulin after surgery (instead of oral diabetes medicines) to make sure you have good blood sugar levels. o The goal for blood sugar control after surgery is 80-180  mg/dL.  Reviewed and Endorsed by [facility] Patient Education Committee, August 2015    No Smoking of any kind, Tobacco/Vaping, or Alcohol products 24 hours prior to your procedure. If you use a Cpap at night, you may bring all equipment for your overnight stay.    Special instructions: [place]- Preparing For Surgery  Before surgery, you can play an important role. Because skin is not sterile, your skin needs to be as free of germs as possible. You can reduce the number of germs on your skin by washing with CHG (chlorahexidine gluconate) Soap before surgery.  CHG is an antiseptic cleaner which kills germs and bonds with the skin to continue killing germs even after washing.    Please do not use if you have an allergy to CHG or antibacterial soaps. If your skin becomes reddened/irritated stop using the CHG.  Do not shave (including legs and underarms) for at least 48 hours prior to first CHG shower. It is OK to shave your face.  Please follow these instructions carefully.   1. Shower the NIGHT BEFORE SURGERY and the MORNING OF SURGERY with CHG.   2. If you chose to wash your hair, wash your hair first as usual with your normal shampoo.  3. After you shampoo, rinse your hair and body thoroughly to remove the shampoo.  4. Use CHG as you would any other liquid soap. You can apply CHG directly to the skin and wash gently with a scrungie or a clean washcloth.   5. Apply the CHG Soap to your body ONLY FROM THE NECK DOWN.  Do not use on open wounds or open sores. Avoid contact with your eyes, ears, mouth and genitals (private parts). Wash Face and genitals (private parts)  with your normal soap.  6. Wash thoroughly, paying special attention to the area where your surgery will be performed.  7. Thoroughly rinse your body with warm water from the neck down.  8. DO NOT shower/wash with your normal soap after using and rinsing off the CHG Soap.  9. Pat yourself dry with a CLEAN  TOWEL.  10. Wear CLEAN PAJAMAS to bed the night before surgery, wear comfortable clothes the morning of surgery  11. Place CLEAN SHEETS on your bed the night of your first shower and DO NOT SLEEP WITH PETS.   Day of Surgery:             Remember to brush your teeth WITH YOUR REGULAR TOOTHPASTE.  Do not wear jewelry.  Do not wear lotions, powders, colognes, or deodorant.  Do not shave 48 hours prior to surgery.  Men may shave face and neck.  Do not bring valuables to the hospital.  [facility] is not responsible for any belongings or valuables.  Contacts, dentures or bridgework may not be worn into surgery.    For patients admitted to  the hospital, discharge time will be determined by your treatment team.  Patients discharged the day of surgery will not be allowed to drive home, and someone age 80 and over needs to stay with them for 24 hours.  Please wear clean clothes to the hospital/surgery center.    Please read over the following fact sheets that you were given.

## 2020-03-19 ENCOUNTER — Ambulatory Visit: Payer: Medicaid Other | Admitting: *Deleted

## 2020-03-19 ENCOUNTER — Encounter (HOSPITAL_COMMUNITY): Payer: Self-pay

## 2020-03-19 ENCOUNTER — Encounter (HOSPITAL_COMMUNITY)
Admission: RE | Admit: 2020-03-19 | Discharge: 2020-03-19 | Disposition: A | Discharge status: Home / Self Care | Payer: Medicaid Other | Source: Ambulatory Visit | Attending: General Surgery | Admitting: General Surgery

## 2020-03-19 ENCOUNTER — Other Ambulatory Visit: Payer: Self-pay

## 2020-03-19 DIAGNOSIS — Z794 Long term (current) use of insulin: Secondary | ICD-10-CM | POA: Diagnosis not present

## 2020-03-19 DIAGNOSIS — I152 Hypertension secondary to endocrine disorders: Secondary | ICD-10-CM

## 2020-03-19 DIAGNOSIS — I4819 Other persistent atrial fibrillation: Secondary | ICD-10-CM

## 2020-03-19 DIAGNOSIS — N183 Chronic kidney disease, stage 3 unspecified: Secondary | ICD-10-CM

## 2020-03-19 DIAGNOSIS — Z01812 Encounter for preprocedural laboratory examination: Secondary | ICD-10-CM | POA: Insufficient documentation

## 2020-03-19 DIAGNOSIS — I5082 Biventricular heart failure: Secondary | ICD-10-CM | POA: Insufficient documentation

## 2020-03-19 DIAGNOSIS — Z9989 Dependence on other enabling machines and devices: Secondary | ICD-10-CM | POA: Insufficient documentation

## 2020-03-19 DIAGNOSIS — I5042 Chronic combined systolic (congestive) and diastolic (congestive) heart failure: Secondary | ICD-10-CM

## 2020-03-19 DIAGNOSIS — I480 Paroxysmal atrial fibrillation: Secondary | ICD-10-CM | POA: Diagnosis not present

## 2020-03-19 DIAGNOSIS — I110 Hypertensive heart disease with heart failure: Secondary | ICD-10-CM | POA: Insufficient documentation

## 2020-03-19 DIAGNOSIS — Z7901 Long term (current) use of anticoagulants: Secondary | ICD-10-CM | POA: Insufficient documentation

## 2020-03-19 DIAGNOSIS — G4733 Obstructive sleep apnea (adult) (pediatric): Secondary | ICD-10-CM | POA: Insufficient documentation

## 2020-03-19 DIAGNOSIS — E1142 Type 2 diabetes mellitus with diabetic polyneuropathy: Secondary | ICD-10-CM

## 2020-03-19 DIAGNOSIS — E1165 Type 2 diabetes mellitus with hyperglycemia: Secondary | ICD-10-CM | POA: Diagnosis not present

## 2020-03-19 HISTORY — DX: Dyspnea, unspecified: R06.00

## 2020-03-19 LAB — CBC
HCT: 39.2 % (ref 39.0–52.0)
Hemoglobin: 12.4 g/dL — ABNORMAL LOW (ref 13.0–17.0)
MCH: 27.2 pg (ref 26.0–34.0)
MCHC: 31.6 g/dL (ref 30.0–36.0)
MCV: 86 fL (ref 80.0–100.0)
Platelets: 153 10*3/uL (ref 150–400)
RBC: 4.56 MIL/uL (ref 4.22–5.81)
RDW: 13.7 % (ref 11.5–15.5)
WBC: 6 10*3/uL (ref 4.0–10.5)
nRBC: 0 % (ref 0.0–0.2)

## 2020-03-19 LAB — BASIC METABOLIC PANEL
Anion gap: 9 (ref 5–15)
BUN: 6 mg/dL — ABNORMAL LOW (ref 8–23)
CO2: 24 mmol/L (ref 22–32)
Calcium: 8.6 mg/dL — ABNORMAL LOW (ref 8.9–10.3)
Chloride: 106 mmol/L (ref 98–111)
Creatinine, Ser: 1.3 mg/dL — ABNORMAL HIGH (ref 0.61–1.24)
GFR, Estimated: >60 mL/min (ref >60)
Glucose, Bld: 130 mg/dL — ABNORMAL HIGH (ref 70–99)
Potassium: 3.7 mmol/L (ref 3.5–5.1)
Sodium: 139 mmol/L (ref 135–145)

## 2020-03-19 LAB — HEMOGLOBIN A1C
Hgb A1c MFr Bld: 9 % — ABNORMAL HIGH (ref 4.8–5.6)
Mean Plasma Glucose: 211.6 mg/dL

## 2020-03-19 LAB — GLUCOSE, CAPILLARY: Glucose-Capillary: 128 mg/dL — ABNORMAL HIGH (ref 70–99)

## 2020-03-19 NOTE — Progress Notes (Addendum)
PCP - Dr. [name] Outpatient clinic Cardiologist - Dr. Melvia Heaps [name]  Chest x-ray - 08/09/19 EKG - 01/11/20 Stress Test - 11/27/14 ECHO - 02/23/19 Cardiac Cath - Denies  Sleep Study - Yes has OSA CPAP - Stopped using temporarily  DM - Yes type II Fasting Blood Sugar - Around 125 average CBG at PAT appt 125 Checks Blood Sugar _Daily in am  Blood Thinner Instructions: Xarelto Surgeon instructed to stop 2 days prior  ERAS Protcol - Yes instructions given  COVID TEST- 03/20/20  Anesthesia review: Yes has cardiac hx (a-fib)   Patient denies shortness of breath, fever, cough and chest pain at PAT appointment   All instructions explained to the patient, with a verbal understanding of the material. Patient agrees to go over the instructions while at home for a better understanding. Patient also instructed to self quarantine after being tested for COVID-19. The opportunity to ask questions was provided.

## 2020-03-19 NOTE — Patient Instructions (Signed)
Visit Information It was nice speaking with you today. I will call you within 48 hours of your discharge to home from your surgery. Goals Addressed              This Visit's Progress     Patient Stated   .  COMPLETED: " I need to find out the name of the eye doctor I was referred to when I fell and hurt my eye so I can have my eyes checked. " (pt-stated)        [place] (see longitudinal plan of care for additional care plan information)  Current Barriers:  . Care Coordination needs related to assistance with scheduling eye exam in a patient with  IDDM, HF, HTN, PAF, CKD- spoke with paitent, he had his eye exam on 03/03/20- he says he was given a prescription for new glasses   Nurse Case Manager Clinical Goal(s):  [name] Over the next 30-60  days, patient will schedule and complete eye exam appointment.  Interventions:  . Inter-disciplinary care team collaboration (see longitudinal plan of care) . Provided positive feedback to patient for completing his eye exam   Patient Self Care Activities:  . Patient verbalizes understanding of plan to schedule eye appointment  . Self administers medications as prescribed . Attends all scheduled provider appointments . Calls pharmacy for medication refills . Calls provider office for new concerns or questions . Patient schedules and completes annual eye exam appointment  . Unable to independently determine ophthalmologist  patient established care with after his fall in December 2020.   Please see past updates related to this goal by clicking on the "Past Updates" button in the selected goal      .  "My blood sugar numbers look good now." (pt-stated)         [place] (see longitudinal plan of care for additional care plan information)  Objective:  Lab Results  Component Value Date   HGBA1C 9.0 (H) 03/19/2020   HGBA1C 10.1 (A) 12/12/2019   HGBA1C 12.7 (A) 07/12/2019   Lab Results  Component Value Date   MICROALBUR 11.3  (H) 04/17/2014   LDLCALC 174 (H) 12/12/2019   CREATININE 1.30 (H) 03/19/2020     Current Barriers:  [name] Knowledge Deficits related to basic Diabetes pathophysiology and self care/management . Knowledge Deficits related to medications used for management of diabetes- spoke with patient to complete follow up assessment, he says he had his preoperative  testing done today in preparation for his hernia surgery on 03/24/20, he reports blood sugars are meeting targets and he reports good medication taking behavior  Case Manager Clinical Goal(s):  Over the next 30-60  days, patient will demonstrate improved adherence to prescribed treatment plan for diabetes self care/management as evidenced by:  . daily monitoring and recording of CBG  . adherence to ADA/ carb modified diet . adherence to prescribed medication regimen  Interventions:  . Provided education to patient about basic DM disease process . Reviewed medications and assessed medication taking behavior . Advised patient, providing education and rationale, to check CBG at least daily and record, calling provider and/or CCM RN for findings outside established parameters.   . Discussed transition of care call that will occur within 48 hours of patient's discharge to home after surgery . Discussed plans with patient for ongoing care management follow up and provided patient with direct contact information for care management team . Review of patient status, including review of consultants reports, relevant laboratory  and other test results, and medications completed.   Patient Self Care Activities:  . UNABLE to independently self manage DM . Self administers oral medications as prescribed . Self administers insulin as prescribed . Self administers injectable DM medication Xultophy as prescribed . Attends all scheduled provider appointments . Checks blood sugars as prescribed and utilize hyper and hypoglycemia protocol as needed . Adheres to  prescribed ADA/carb modified  Please see past updates related to this goal by clicking on the "Past Updates" button in the selected goal             The patient verbalized understanding of instructions provided today and declined a print copy of patient instruction materials.   The care management team will reach out to the patient again over the next 7 days.   [name] RN, CCM, [facility] RN Care Manager [phone]

## 2020-03-19 NOTE — Chronic Care Management (AMB) (Signed)
Care Management   Follow Up Note   03/19/2020 Name: Terry Parks MRN: [id number] DOB: 01-24-1956  Referred by: [name], MD Reason for referral : Care Coordination (IDDM, HF, HTN, PAF, CKD)   Terry Parks is a 64 y.o. year old male who is a primary care patient of [name], MD. The care management team was consulted for assistance with care management and care coordination needs.    Review of patient status, including review of consultants reports, relevant laboratory and other test results, and collaboration with appropriate care team members and the patient's provider was performed as part of comprehensive patient evaluation and provision of chronic care management services.    SDOH (Social Determinants of Health) assessments performed: No See Care Plan activities for detailed interventions related to [facility])     Advanced Directives: See Care Plan and Vynca application for related entries.  Overdue for yearly foot exam.   Goals Addressed              This Visit's Progress     Patient Stated   .  COMPLETED: " I need to find out the name of the eye doctor I was referred to when I fell and hurt my eye so I can have my eyes checked. " (pt-stated)        [place] (see longitudinal plan of care for additional care plan information)  Current Barriers:  . Care Coordination needs related to assistance with scheduling eye exam in a patient with  IDDM, HF, HTN, PAF, CKD- spoke with patient, he had his eye exam on 03/03/20- he says he was given a prescription for new glasses   Nurse Case Manager Clinical Goal(s):  [name] Over the next 30-60  days, patient will schedule and complete eye exam appointment.  Interventions:  . Inter-disciplinary care team collaboration (see longitudinal plan of care) . Provided positive feedback to patient for completing his eye exam   Patient Self Care Activities:  . Patient verbalizes understanding of plan to schedule eye  appointment  . Self administers medications as prescribed . Attends all scheduled provider appointments . Calls pharmacy for medication refills . Calls provider office for new concerns or questions . Patient schedules and completes annual eye exam appointment  . Unable to independently determine ophthalmologist  patient established care with after his fall in December 2020.   Please see past updates related to this goal by clicking on the "Past Updates" button in the selected goal      .  "My blood sugar numbers look good now." (pt-stated)         [place] (see longitudinal plan of care for additional care plan information)  Objective:  Lab Results  Component Value Date   HGBA1C 9.0 (H) 03/19/2020   HGBA1C 10.1 (A) 12/12/2019   HGBA1C 12.7 (A) 07/12/2019   Lab Results  Component Value Date   MICROALBUR 11.3 (H) 04/17/2014   LDLCALC 174 (H) 12/12/2019   CREATININE 1.30 (H) 03/19/2020     Current Barriers:  [name] Knowledge Deficits related to basic Diabetes pathophysiology and self care/management . Knowledge Deficits related to medications used for management of diabetes- spoke with patient to complete follow up assessment, he says he had his preoperative  testing done today in preparation for his hernia surgery on 03/24/20, he reports blood sugars are meeting targets and he reports good medication taking behavior  Case Manager Clinical Goal(s):  Over the next 30-60  days, patient will demonstrate improved adherence  to prescribed treatment plan for diabetes self care/management as evidenced by:  . daily monitoring and recording of CBG  . adherence to ADA/ carb modified diet . adherence to prescribed medication regimen  Interventions:  . Provided education to patient about basic DM disease process . Reviewed medications and assessed medication taking behavior . Advised patient, providing education and rationale, to check CBG at least daily and record, calling provider and/or  CCM RN for findings outside established parameters.   . Discussed transition of care call that will occur within 48 hours of patient's discharge to home after surgery . Discussed plans with patient for ongoing care management follow up and provided patient with direct contact information for care management team . Review of patient status, including review of consultants reports, relevant laboratory and other test results, and medications completed.   Patient Self Care Activities:  . UNABLE to independently self manage DM . Self administers oral medications as prescribed . Self administers insulin as prescribed . Self administers injectable DM medication Xultophy as prescribed . Attends all scheduled provider appointments . Checks blood sugars as prescribed and utilize hyper and hypoglycemia protocol as needed . Adheres to prescribed ADA/carb modified  Please see past updates related to this goal by clicking on the "Past Updates" button in the selected goal              The care management team will reach out to the patient again over the next 7 days.   [name] RN, CCM, [facility] RN Care Manager [phone]

## 2020-03-20 ENCOUNTER — Other Ambulatory Visit (HOSPITAL_COMMUNITY)
Admission: RE | Admit: 2020-03-20 | Discharge: 2020-03-20 | Disposition: A | Discharge status: Home / Self Care | Payer: Medicaid Other | Source: Ambulatory Visit | Attending: General Surgery | Admitting: General Surgery

## 2020-03-20 DIAGNOSIS — Z20822 Contact with and (suspected) exposure to covid-19: Secondary | ICD-10-CM | POA: Insufficient documentation

## 2020-03-20 DIAGNOSIS — Z01812 Encounter for preprocedural laboratory examination: Secondary | ICD-10-CM | POA: Diagnosis not present

## 2020-03-20 LAB — SARS CORONAVIRUS 2 (TAT 6-24 HRS): SARS Coronavirus 2: NEGATIVE

## 2020-03-20 NOTE — Anesthesia Preprocedure Evaluation (Addendum)
Anesthesia Evaluation  Patient identified by MRN, date of birth, ID band Patient awake    Reviewed: Allergy & Precautions, NPO status , Patient's Chart, lab work & pertinent test results  Airway Mallampati: II  TM Distance: >3 FB     Dental   Pulmonary shortness of breath, asthma , sleep apnea ,    breath sounds clear to auscultation       Cardiovascular hypertension, +CHF  + dysrhythmias  Rhythm:Regular Rate:Normal     Neuro/Psych  Headaches, Anxiety Depression  Neuromuscular disease    GI/Hepatic Neg liver ROS, GERD  ,  Endo/Other  diabetes  Renal/GU Renal disease     Musculoskeletal   Abdominal   Peds  Hematology  (+) anemia ,   Anesthesia Other Findings   Reproductive/Obstetrics                            Anesthesia Physical Anesthesia Plan  ASA: III  Anesthesia Plan: General   Post-op Pain Management:    Induction: Intravenous  PONV Risk Score and Plan: 2 and Ondansetron, Dexamethasone and Midazolam  Airway Management Planned: Oral ETT  Additional Equipment:   Intra-op Plan:   Post-operative Plan: Possible Post-op intubation/ventilation  Informed Consent:     Dental advisory given  Plan Discussed with: CRNA and Anesthesiologist  Anesthesia Plan Comments: (PAT note by Karoline Caldwell, PA-C: Follows with cardiology for 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  Last seen by Dr. Sallyanne Kuster 01/11/2020 and was noted to be clinically euvolemic at that time and maintaining sinus rhythm.  Cardiac clearance per telephone encounter 02/06/2020, "Chart reviewed as part of pre-operative protocol coverage. Patient was contacted 02/06/2020 in reference to pre-operative risk assessment for pending surgery as outlined below.  Caulin Begley was last seen on 01/11/20 by Dr. Sallyanne Kuster.  The  was doing well on cardiac stand point at the time of visit. Therefore, based on ACC/AHA guidelines, the patient would be at acceptable risk for the planned procedure without further cardiovascular testing. "  Per pharmacy, he was advised he can hold Xarelto for 2 days prior to procedure.  History of poorly controlled IDDM 2.  A1c 9.0 on preop labs.  Preop labs also showed mildly elevated creatinine at 1.30 which appears to be near his baseline.  Remainder of labs unremarkable.  EKG 01/11/2020: NSR.  Rate 87.  CHEST - 2 VIEW 08/10/2019: COMPARISON:  April 23, 2019  FINDINGS: The heart size is enlarged. There is no pneumothorax or large pleural effusion. No acute osseous abnormality. No focal infiltrate.  IMPRESSION: No active cardiopulmonary disease.  TTE 02/23/2019: 1. Left ventricular ejection fraction, by visual estimation, is 35 to  40%. The left ventricle has moderately decreased function. Normal left  ventricular size. There is moderate left ventricular hypertrophy. There is  blobal left ventricular hypokinesis,  without regional variation.  2. Left ventricular diastolic Doppler parameters are consistent with  impaired relaxation pattern of LV diastolic filling.  3. Global right ventricle has mildly reduced systolic function.The right  ventricular size is not well visualized. Right vetricular wall thickness  was not assessed.  4. Left atrial size was mildly dilated.  5. Right atrial size was mildly dilated.  6. The aortic valve is normal in structure. Aortic valve regurgitation  was not visualized by color flow Doppler.  7. The aortic root was not well visualized.  8. TR signal is inadequate for assessing pulmonary artery systolic  pressure.  9. The inferior vena cava is normal in size with greater than 50%  respiratory variability, suggesting right atrial pressure of 3 mmHg.  10. The mitral valve is normal in structure. No evidence of mitral valve  regurgitation.   11. The tricuspid valve is normal in structure. Tricuspid valve  regurgitation was not visualized by color flow Doppler.  12. The pulmonic valve was not well visualized. Pulmonic valve  regurgitation is not visualized by color flow Doppler.   )      Anesthesia Quick Evaluation

## 2020-03-20 NOTE — Progress Notes (Signed)
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  Last seen by Dr. Sallyanne Kuster 01/11/2020 and was noted to be clinically euvolemic at that time and maintaining sinus rhythm.  Cardiac clearance per telephone encounter 02/06/2020, "Chart reviewed as part of pre-operative protocol coverage. Patient was contacted 02/06/2020 in reference to pre-operative risk assessment for pending surgery as outlined below.  Terry Parks was last seen on 01/11/20 by Dr. Sallyanne Kuster.  The was doing well on cardiac stand point at the time of visit. Therefore, based on ACC/AHA guidelines, the patient would be at acceptable risk for the planned procedure without further cardiovascular testing. "  Per pharmacy, he was advised he can hold Xarelto for 2 days prior to procedure.  History of poorly controlled IDDM 2.  A1c 9.0 on preop labs.  Preop labs also showed mildly elevated creatinine at 1.30 which appears to be near his baseline.  Remainder of labs unremarkable.  EKG 01/11/2020: NSR.  Rate 87.  CHEST - 2 VIEW 08/10/2019: COMPARISON:  April 23, 2019  FINDINGS: The heart size is enlarged. There is no pneumothorax or large pleural effusion. No acute osseous abnormality. No focal infiltrate.  IMPRESSION: No active cardiopulmonary disease.  TTE 02/23/2019: 1. Left ventricular ejection fraction, by visual estimation, is 35 to  40%. The left ventricle has moderately decreased function. Normal left  ventricular size. There is moderate left ventricular hypertrophy. There is  blobal left ventricular hypokinesis,  without regional variation.  2. Left ventricular diastolic Doppler parameters are consistent with  impaired relaxation pattern of LV diastolic filling.  3. Global right ventricle has mildly reduced systolic function.The right  ventricular size is not well  visualized. Right vetricular wall thickness  was not assessed.  4. Left atrial size was mildly dilated.  5. Right atrial size was mildly dilated.  6. The aortic valve is normal in structure. Aortic valve regurgitation  was not visualized by color flow Doppler.  7. The aortic root was not well visualized.  8. TR signal is inadequate for assessing pulmonary artery systolic  pressure.  9. The inferior vena cava is normal in size with greater than 50%  respiratory variability, suggesting right atrial pressure of 3 mmHg.  10. The mitral valve is normal in structure. No evidence of mitral valve  regurgitation.  11. The tricuspid valve is normal in structure. Tricuspid valve  regurgitation was not visualized by color flow Doppler.  12. The pulmonic valve was not well visualized. Pulmonic valve  regurgitation is not visualized by color flow Doppler.    Wynonia Musty [facility] Short Stay Center/Anesthesiology Phone [phone] 03/20/2020 10:00 AM

## 2020-03-20 NOTE — Progress Notes (Signed)
Internal Medicine Clinic Resident  I have personally reviewed this encounter including the documentation in this note and/or discussed this patient with the care management provider. I will address any urgent items identified by the care management provider and will communicate my actions to the patient's PCP. I have reviewed the patient's CCM visit with my supervising attending, Dr Heber Stockton.  [place], DO 03/20/2020

## 2020-03-21 MED ORDER — DEXTROSE 5 % IV SOLN
3.0000 g | INTRAVENOUS | Status: DC
  Filled 2020-03-21: qty 3000

## 2020-03-24 ENCOUNTER — Encounter (HOSPITAL_COMMUNITY)
Admission: AD | Disposition: A | Discharge status: Home / Self Care | Payer: Self-pay | Source: Home / Self Care | Attending: General Surgery

## 2020-03-24 ENCOUNTER — Ambulatory Visit (HOSPITAL_COMMUNITY): Payer: Medicaid Other | Admitting: Certified Registered Nurse Anesthetist

## 2020-03-24 ENCOUNTER — Encounter (HOSPITAL_COMMUNITY): Payer: Self-pay | Admitting: General Surgery

## 2020-03-24 ENCOUNTER — Inpatient Hospital Stay (HOSPITAL_COMMUNITY)
Admission: AD | Admit: 2020-03-24 | Discharge: 2020-03-26 | DRG: 354 | Disposition: A | Discharge status: Home / Self Care | Payer: Medicaid Other | Attending: General Surgery | Admitting: General Surgery

## 2020-03-24 ENCOUNTER — Other Ambulatory Visit: Payer: Self-pay

## 2020-03-24 ENCOUNTER — Ambulatory Visit (HOSPITAL_COMMUNITY): Payer: Medicaid Other | Admitting: Physician Assistant

## 2020-03-24 DIAGNOSIS — J45909 Unspecified asthma, uncomplicated: Secondary | ICD-10-CM | POA: Diagnosis present

## 2020-03-24 DIAGNOSIS — Z9889 Other specified postprocedural states: Secondary | ICD-10-CM

## 2020-03-24 DIAGNOSIS — I480 Paroxysmal atrial fibrillation: Secondary | ICD-10-CM | POA: Diagnosis present

## 2020-03-24 DIAGNOSIS — Z6841 Body Mass Index (BMI) 40.0 and over, adult: Secondary | ICD-10-CM

## 2020-03-24 DIAGNOSIS — Z8546 Personal history of malignant neoplasm of prostate: Secondary | ICD-10-CM

## 2020-03-24 DIAGNOSIS — N183 Chronic kidney disease, stage 3 unspecified: Secondary | ICD-10-CM | POA: Diagnosis present

## 2020-03-24 DIAGNOSIS — I130 Hypertensive heart and chronic kidney disease with heart failure and stage 1 through stage 4 chronic kidney disease, or unspecified chronic kidney disease: Secondary | ICD-10-CM | POA: Diagnosis present

## 2020-03-24 DIAGNOSIS — N400 Enlarged prostate without lower urinary tract symptoms: Secondary | ICD-10-CM | POA: Diagnosis present

## 2020-03-24 DIAGNOSIS — Z888 Allergy status to other drugs, medicaments and biological substances status: Secondary | ICD-10-CM

## 2020-03-24 DIAGNOSIS — K432 Incisional hernia without obstruction or gangrene: Principal | ICD-10-CM | POA: Diagnosis present

## 2020-03-24 DIAGNOSIS — Z8719 Personal history of other diseases of the digestive system: Secondary | ICD-10-CM

## 2020-03-24 DIAGNOSIS — N179 Acute kidney failure, unspecified: Secondary | ICD-10-CM | POA: Diagnosis present

## 2020-03-24 DIAGNOSIS — I4819 Other persistent atrial fibrillation: Secondary | ICD-10-CM

## 2020-03-24 DIAGNOSIS — Z79899 Other long term (current) drug therapy: Secondary | ICD-10-CM

## 2020-03-24 DIAGNOSIS — Z7984 Long term (current) use of oral hypoglycemic drugs: Secondary | ICD-10-CM

## 2020-03-24 DIAGNOSIS — G4733 Obstructive sleep apnea (adult) (pediatric): Secondary | ICD-10-CM | POA: Diagnosis present

## 2020-03-24 DIAGNOSIS — Z7901 Long term (current) use of anticoagulants: Secondary | ICD-10-CM

## 2020-03-24 DIAGNOSIS — K219 Gastro-esophageal reflux disease without esophagitis: Secondary | ICD-10-CM | POA: Diagnosis present

## 2020-03-24 DIAGNOSIS — Z794 Long term (current) use of insulin: Secondary | ICD-10-CM

## 2020-03-24 DIAGNOSIS — E1122 Type 2 diabetes mellitus with diabetic chronic kidney disease: Secondary | ICD-10-CM | POA: Diagnosis present

## 2020-03-24 DIAGNOSIS — E785 Hyperlipidemia, unspecified: Secondary | ICD-10-CM | POA: Diagnosis present

## 2020-03-24 DIAGNOSIS — G4730 Sleep apnea, unspecified: Secondary | ICD-10-CM | POA: Diagnosis present

## 2020-03-24 DIAGNOSIS — Z9079 Acquired absence of other genital organ(s): Secondary | ICD-10-CM

## 2020-03-24 DIAGNOSIS — Z87442 Personal history of urinary calculi: Secondary | ICD-10-CM

## 2020-03-24 DIAGNOSIS — F32A Depression, unspecified: Secondary | ICD-10-CM | POA: Diagnosis present

## 2020-03-24 DIAGNOSIS — I5042 Chronic combined systolic (congestive) and diastolic (congestive) heart failure: Secondary | ICD-10-CM | POA: Diagnosis present

## 2020-03-24 DIAGNOSIS — F419 Anxiety disorder, unspecified: Secondary | ICD-10-CM | POA: Diagnosis present

## 2020-03-24 HISTORY — PX: INCISIONAL HERNIA REPAIR: SHX193

## 2020-03-24 LAB — GLUCOSE, CAPILLARY
Glucose-Capillary: 220 mg/dL — ABNORMAL HIGH (ref 70–99)
Glucose-Capillary: 167 mg/dL — ABNORMAL HIGH (ref 70–99)
Glucose-Capillary: 176 mg/dL — ABNORMAL HIGH (ref 70–99)
Glucose-Capillary: 278 mg/dL — ABNORMAL HIGH (ref 70–99)

## 2020-03-24 SURGERY — REPAIR, HERNIA, INCISIONAL, LAPAROSCOPIC
Anesthesia: General | Site: Abdomen

## 2020-03-24 MED ORDER — TORSEMIDE 20 MG PO TABS
20.0000 mg | ORAL_TABLET | Freq: Two times a day (BID) | ORAL | Status: DC
  Administered 2020-03-24 – 2020-03-25 (×2): 20 mg via ORAL
  Filled 2020-03-24 (×2): qty 1

## 2020-03-24 MED ORDER — PHENYLEPHRINE 40 MCG/ML (10ML) SYRINGE FOR IV PUSH (FOR BLOOD PRESSURE SUPPORT)
PREFILLED_SYRINGE | INTRAVENOUS | Status: AC
  Filled 2020-03-24: qty 10

## 2020-03-24 MED ORDER — PANTOPRAZOLE SODIUM 40 MG PO TBEC
40.0000 mg | DELAYED_RELEASE_TABLET | Freq: Every day | ORAL | Status: DC
  Administered 2020-03-25 – 2020-03-26 (×2): 40 mg via ORAL
  Filled 2020-03-24 (×2): qty 1

## 2020-03-24 MED ORDER — ACETAMINOPHEN 500 MG PO TABS
1000.0000 mg | ORAL_TABLET | ORAL | Status: AC
  Administered 2020-03-24: 1000 mg via ORAL
  Filled 2020-03-24: qty 2

## 2020-03-24 MED ORDER — INSULIN ASPART 100 UNIT/ML ~~LOC~~ SOLN
0.0000 [IU] | Freq: Three times a day (TID) | SUBCUTANEOUS | Status: DC
  Administered 2020-03-24: 3 [IU] via SUBCUTANEOUS
  Administered 2020-03-25 (×2): 8 [IU] via SUBCUTANEOUS
  Administered 2020-03-25: 5 [IU] via SUBCUTANEOUS
  Administered 2020-03-26 (×2): 3 [IU] via SUBCUTANEOUS

## 2020-03-24 MED ORDER — FENTANYL CITRATE (PF) 100 MCG/2ML IJ SOLN
25.0000 ug | INTRAMUSCULAR | Status: DC | PRN
  Administered 2020-03-24: 50 ug via INTRAVENOUS

## 2020-03-24 MED ORDER — CHLORHEXIDINE GLUCONATE CLOTH 2 % EX PADS: 6.0000 | MEDICATED_PAD | Freq: Once | CUTANEOUS | Status: DC

## 2020-03-24 MED ORDER — ATORVASTATIN CALCIUM 40 MG PO TABS
40.0000 mg | ORAL_TABLET | Freq: Every day | ORAL | Status: DC
  Administered 2020-03-25 – 2020-03-26 (×2): 40 mg via ORAL
  Filled 2020-03-24 (×2): qty 1

## 2020-03-24 MED ORDER — EMPAGLIFLOZIN 10 MG PO TABS
10.0000 mg | ORAL_TABLET | Freq: Every day | ORAL | Status: DC
  Administered 2020-03-25 – 2020-03-26 (×2): 10 mg via ORAL
  Filled 2020-03-24 (×3): qty 1

## 2020-03-24 MED ORDER — ORAL CARE MOUTH RINSE: 15.0000 mL | Freq: Once | OROMUCOSAL | Status: AC

## 2020-03-24 MED ORDER — CHLORHEXIDINE GLUCONATE 0.12 % MT SOLN
15.0000 mL | Freq: Once | OROMUCOSAL | Status: AC
  Administered 2020-03-24: 15 mL via OROMUCOSAL
  Filled 2020-03-24: qty 15

## 2020-03-24 MED ORDER — ONDANSETRON HCL 4 MG/2ML IJ SOLN: 4.0000 mg | Freq: Four times a day (QID) | INTRAMUSCULAR | Status: DC | PRN

## 2020-03-24 MED ORDER — METHOCARBAMOL 500 MG PO TABS: 500.0000 mg | ORAL_TABLET | Freq: Four times a day (QID) | ORAL | Status: DC | PRN

## 2020-03-24 MED ORDER — LIDOCAINE 2% (20 MG/ML) 5 ML SYRINGE
INTRAMUSCULAR | Status: DC | PRN
  Administered 2020-03-24: 100 mg via INTRAVENOUS

## 2020-03-24 MED ORDER — ONDANSETRON HCL 4 MG/2ML IJ SOLN
INTRAMUSCULAR | Status: AC
  Filled 2020-03-24: qty 2

## 2020-03-24 MED ORDER — ONDANSETRON HCL 4 MG/2ML IJ SOLN
INTRAMUSCULAR | Status: DC | PRN
  Administered 2020-03-24: 4 mg via INTRAVENOUS

## 2020-03-24 MED ORDER — LACTATED RINGERS IV SOLN: INTRAVENOUS | Status: DC

## 2020-03-24 MED ORDER — ONDANSETRON 4 MG PO TBDP: 4.0000 mg | ORAL_TABLET | Freq: Four times a day (QID) | ORAL | Status: DC | PRN

## 2020-03-24 MED ORDER — DEXAMETHASONE SODIUM PHOSPHATE 10 MG/ML IJ SOLN
INTRAMUSCULAR | Status: DC | PRN
  Administered 2020-03-24: 4 mg via INTRAVENOUS

## 2020-03-24 MED ORDER — OXYCODONE HCL 5 MG PO TABS: 5.0000 mg | ORAL_TABLET | ORAL | Status: DC | PRN

## 2020-03-24 MED ORDER — FENTANYL CITRATE (PF) 250 MCG/5ML IJ SOLN
INTRAMUSCULAR | Status: AC
  Filled 2020-03-24: qty 5

## 2020-03-24 MED ORDER — SPIRONOLACTONE 25 MG PO TABS
50.0000 mg | ORAL_TABLET | Freq: Every day | ORAL | Status: DC
  Administered 2020-03-24: 50 mg via ORAL
  Filled 2020-03-24: qty 2

## 2020-03-24 MED ORDER — BUPIVACAINE HCL (PF) 0.25 % IJ SOLN
INTRAMUSCULAR | Status: AC
  Filled 2020-03-24: qty 30

## 2020-03-24 MED ORDER — GABAPENTIN 300 MG PO CAPS
300.0000 mg | ORAL_CAPSULE | Freq: Two times a day (BID) | ORAL | Status: DC
  Administered 2020-03-24 – 2020-03-26 (×5): 300 mg via ORAL
  Filled 2020-03-24 (×5): qty 1

## 2020-03-24 MED ORDER — INSULIN DEGLUDEC-LIRAGLUTIDE 100-3.6 UNIT-MG/ML ~~LOC~~ SOPN: 52.0000 [IU] | PEN_INJECTOR | Freq: Every day | SUBCUTANEOUS | Status: DC

## 2020-03-24 MED ORDER — ENOXAPARIN SODIUM 40 MG/0.4ML ~~LOC~~ SOLN
40.0000 mg | SUBCUTANEOUS | Status: DC
  Administered 2020-03-25 – 2020-03-26 (×2): 40 mg via SUBCUTANEOUS
  Filled 2020-03-24 (×2): qty 0.4

## 2020-03-24 MED ORDER — PROPOFOL 10 MG/ML IV BOLUS
INTRAVENOUS | Status: DC | PRN
  Administered 2020-03-24: 200 mg via INTRAVENOUS

## 2020-03-24 MED ORDER — FENTANYL CITRATE (PF) 100 MCG/2ML IJ SOLN
INTRAMUSCULAR | Status: DC | PRN
  Administered 2020-03-24: 150 ug via INTRAVENOUS

## 2020-03-24 MED ORDER — DEXTROSE 5 % IV SOLN
INTRAVENOUS | Status: DC | PRN
  Administered 2020-03-24: 3 g via INTRAVENOUS

## 2020-03-24 MED ORDER — CARVEDILOL 25 MG PO TABS
25.0000 mg | ORAL_TABLET | Freq: Two times a day (BID) | ORAL | Status: DC
  Administered 2020-03-24 – 2020-03-26 (×4): 25 mg via ORAL
  Filled 2020-03-24 (×4): qty 1

## 2020-03-24 MED ORDER — BUPIVACAINE HCL 0.25 % IJ SOLN
INTRAMUSCULAR | Status: DC | PRN
  Administered 2020-03-24: 23 mL

## 2020-03-24 MED ORDER — SUGAMMADEX SODIUM 200 MG/2ML IV SOLN
INTRAVENOUS | Status: DC | PRN
  Administered 2020-03-24: 300 mg via INTRAVENOUS

## 2020-03-24 MED ORDER — PHENYLEPHRINE 40 MCG/ML (10ML) SYRINGE FOR IV PUSH (FOR BLOOD PRESSURE SUPPORT)
PREFILLED_SYRINGE | INTRAVENOUS | Status: DC | PRN
  Administered 2020-03-24 (×2): 120 ug via INTRAVENOUS
  Administered 2020-03-24: 80 ug via INTRAVENOUS

## 2020-03-24 MED ORDER — HYDROMORPHONE HCL 1 MG/ML IJ SOLN
1.0000 mg | INTRAMUSCULAR | Status: DC | PRN
  Administered 2020-03-24: 1 mg via INTRAVENOUS
  Filled 2020-03-24: qty 1

## 2020-03-24 MED ORDER — 0.9 % SODIUM CHLORIDE (POUR BTL) OPTIME
TOPICAL | Status: DC | PRN
  Administered 2020-03-24: 1000 mL

## 2020-03-24 MED ORDER — IBUPROFEN 800 MG PO TABS: 800.0000 mg | ORAL_TABLET | Freq: Three times a day (TID) | ORAL | Status: DC | PRN

## 2020-03-24 MED ORDER — DIPHENHYDRAMINE HCL 12.5 MG/5ML PO ELIX: 12.5000 mg | ORAL_SOLUTION | Freq: Four times a day (QID) | ORAL | Status: DC | PRN

## 2020-03-24 MED ORDER — SUCCINYLCHOLINE CHLORIDE 20 MG/ML IJ SOLN
INTRAMUSCULAR | Status: DC | PRN
  Administered 2020-03-24: 120 mg via INTRAVENOUS

## 2020-03-24 MED ORDER — INSULIN DEGLUDEC-LIRAGLUTIDE 100-3.6 UNIT-MG/ML ~~LOC~~ SOPN
26.0000 [IU] | PEN_INJECTOR | Freq: Every day | SUBCUTANEOUS | Status: DC
  Administered 2020-03-24: 26 [IU] via SUBCUTANEOUS

## 2020-03-24 MED ORDER — GABAPENTIN 300 MG PO CAPS
300.0000 mg | ORAL_CAPSULE | ORAL | Status: AC
  Administered 2020-03-24: 300 mg via ORAL
  Filled 2020-03-24: qty 1

## 2020-03-24 MED ORDER — MIDAZOLAM HCL 5 MG/5ML IJ SOLN
INTRAMUSCULAR | Status: DC | PRN
  Administered 2020-03-24: 1 mg via INTRAVENOUS

## 2020-03-24 MED ORDER — PROPOFOL 10 MG/ML IV BOLUS
INTRAVENOUS | Status: AC
  Filled 2020-03-24: qty 20

## 2020-03-24 MED ORDER — SODIUM CHLORIDE 0.9 % IV SOLN: INTRAVENOUS | Status: DC

## 2020-03-24 MED ORDER — INSULIN GLARGINE 100 UNIT/ML ~~LOC~~ SOLN
26.0000 [IU] | Freq: Every day | SUBCUTANEOUS | Status: DC
  Filled 2020-03-24: qty 0.26

## 2020-03-24 MED ORDER — PHENYLEPHRINE HCL-NACL 10-0.9 MG/250ML-% IV SOLN
INTRAVENOUS | Status: DC | PRN
  Administered 2020-03-24: 40 ug/min via INTRAVENOUS

## 2020-03-24 MED ORDER — FENTANYL CITRATE (PF) 100 MCG/2ML IJ SOLN
INTRAMUSCULAR | Status: AC
  Filled 2020-03-24: qty 2

## 2020-03-24 MED ORDER — ROCURONIUM BROMIDE 10 MG/ML (PF) SYRINGE
PREFILLED_SYRINGE | INTRAVENOUS | Status: DC | PRN
  Administered 2020-03-24: 15 mg via INTRAVENOUS
  Administered 2020-03-24: 60 mg via INTRAVENOUS

## 2020-03-24 MED ORDER — DEXAMETHASONE SODIUM PHOSPHATE 10 MG/ML IJ SOLN
INTRAMUSCULAR | Status: AC
  Filled 2020-03-24: qty 3

## 2020-03-24 MED ORDER — SUCCINYLCHOLINE CHLORIDE 200 MG/10ML IV SOSY
PREFILLED_SYRINGE | INTRAVENOUS | Status: AC
  Filled 2020-03-24: qty 10

## 2020-03-24 MED ORDER — AMIODARONE HCL 200 MG PO TABS
200.0000 mg | ORAL_TABLET | Freq: Every day | ORAL | Status: DC
  Administered 2020-03-25 – 2020-03-26 (×2): 200 mg via ORAL
  Filled 2020-03-24 (×2): qty 1

## 2020-03-24 MED ORDER — ROCURONIUM BROMIDE 10 MG/ML (PF) SYRINGE
PREFILLED_SYRINGE | INTRAVENOUS | Status: AC
  Filled 2020-03-24: qty 10

## 2020-03-24 MED ORDER — DIPHENHYDRAMINE HCL 50 MG/ML IJ SOLN: 12.5000 mg | Freq: Four times a day (QID) | INTRAMUSCULAR | Status: DC | PRN

## 2020-03-24 MED ORDER — LIDOCAINE 2% (20 MG/ML) 5 ML SYRINGE
INTRAMUSCULAR | Status: AC
  Filled 2020-03-24: qty 10

## 2020-03-24 MED ORDER — HYDRALAZINE HCL 10 MG PO TABS: 10.0000 mg | ORAL_TABLET | Freq: Four times a day (QID) | ORAL | Status: DC | PRN

## 2020-03-24 MED ORDER — SACUBITRIL-VALSARTAN 97-103 MG PO TABS
1.0000 | ORAL_TABLET | Freq: Two times a day (BID) | ORAL | Status: DC
  Administered 2020-03-24: 1 via ORAL
  Filled 2020-03-24 (×2): qty 1

## 2020-03-24 MED ORDER — MIDAZOLAM HCL 2 MG/2ML IJ SOLN
INTRAMUSCULAR | Status: AC
  Filled 2020-03-24: qty 2

## 2020-03-24 SURGICAL SUPPLY — 49 items
APPLIER CLIP 5 13 M/L LIGAMAX5 (MISCELLANEOUS)
APPLIER CLIP ROT 10 11.4 M/L (STAPLE)
BINDER ABDOMINAL 12 ML 46-62 (SOFTGOODS) IMPLANT
BINDER BREAST LRG (GAUZE/BANDAGES/DRESSINGS) ×2 IMPLANT
BLADE CLIPPER SURG (BLADE) IMPLANT
CANISTER SUCT 3000ML PPV (MISCELLANEOUS) IMPLANT
CHLORAPREP W/TINT 26 (MISCELLANEOUS) ×2 IMPLANT
CLIP APPLIE 5 13 M/L LIGAMAX5 (MISCELLANEOUS) IMPLANT
CLIP APPLIE ROT 10 11.4 M/L (STAPLE) IMPLANT
COVER SURGICAL LIGHT HANDLE (MISCELLANEOUS) ×2 IMPLANT
COVER WAND RF STERILE (DRAPES) IMPLANT
DERMABOND ADVANCED (GAUZE/BANDAGES/DRESSINGS) ×1
DERMABOND ADVANCED .7 DNX12 (GAUZE/BANDAGES/DRESSINGS) ×1 IMPLANT
DEVICE SECURE STRAP 25 ABSORB (INSTRUMENTS) ×2 IMPLANT
ELECT REM PT RETURN 9FT ADLT (ELECTROSURGICAL) ×2
ELECTRODE REM PT RTRN 9FT ADLT (ELECTROSURGICAL) ×1 IMPLANT
GLOVE BIO SURGEON STRL SZ8 (GLOVE) ×2 IMPLANT
GLOVE BIOGEL PI IND STRL 8 (GLOVE) ×1 IMPLANT
GLOVE BIOGEL PI INDICATOR 8 (GLOVE) ×1
GOWN STRL REUS W/ TWL LRG LVL3 (GOWN DISPOSABLE) ×2 IMPLANT
GOWN STRL REUS W/ TWL XL LVL3 (GOWN DISPOSABLE) ×1 IMPLANT
GOWN STRL REUS W/TWL LRG LVL3 (GOWN DISPOSABLE) ×4
GOWN STRL REUS W/TWL XL LVL3 (GOWN DISPOSABLE) ×2
GRASPER SUT TROCAR 14GX15 (MISCELLANEOUS) ×2 IMPLANT
KIT BASIN OR (CUSTOM PROCEDURE TRAY) ×2 IMPLANT
KIT TURNOVER KIT B (KITS) ×2 IMPLANT
MARKER SKIN DUAL TIP RULER LAB (MISCELLANEOUS) ×2 IMPLANT
MESH VENTRALIGHT ST 7X9N (Mesh General) ×2 IMPLANT
NEEDLE 22X1 1/2 (OR ONLY) (NEEDLE) ×2 IMPLANT
NEEDLE SPNL 22GX3.5 QUINCKE BK (NEEDLE) ×2 IMPLANT
NS IRRIG 1000ML POUR BTL (IV SOLUTION) ×2 IMPLANT
PAD ARMBOARD 7.5X6 YLW CONV (MISCELLANEOUS) ×4 IMPLANT
SCISSORS LAP 5X35 DISP (ENDOMECHANICALS) ×2 IMPLANT
SET IRRIG TUBING LAPAROSCOPIC (IRRIGATION / IRRIGATOR) ×2 IMPLANT
SET TUBE SMOKE EVAC HIGH FLOW (TUBING) ×2 IMPLANT
SHEARS HARMONIC ACE PLUS 36CM (ENDOMECHANICALS) IMPLANT
SLEEVE ENDOPATH XCEL 5M (ENDOMECHANICALS) ×4 IMPLANT
SUT PROLENE 0 CT 1 CR/8 (SUTURE) ×2 IMPLANT
SUT VIC AB 4-0 PS2 27 (SUTURE) ×2 IMPLANT
SUT VICRYL 0 TIES 12 18 (SUTURE) IMPLANT
TOWEL GREEN STERILE (TOWEL DISPOSABLE) ×2 IMPLANT
TOWEL GREEN STERILE FF (TOWEL DISPOSABLE) ×2 IMPLANT
TRAY FOLEY W/BAG SLVR 16FR (SET/KITS/TRAYS/PACK)
TRAY FOLEY W/BAG SLVR 16FR ST (SET/KITS/TRAYS/PACK) IMPLANT
TRAY LAPAROSCOPIC MC (CUSTOM PROCEDURE TRAY) ×2 IMPLANT
TROCAR XCEL BLUNT TIP 100MML (ENDOMECHANICALS) IMPLANT
TROCAR XCEL NON-BLD 11X100MML (ENDOMECHANICALS) ×2 IMPLANT
TROCAR XCEL NON-BLD 5MMX100MML (ENDOMECHANICALS) ×2 IMPLANT
WATER STERILE IRR 1000ML POUR (IV SOLUTION) ×2 IMPLANT

## 2020-03-24 NOTE — Op Note (Signed)
03/24/2020  10:55 AM  PATIENT:  Terry Parks  64 y.o. male  PRE-OPERATIVE DIAGNOSIS:  INCISIONAL HERNIA  POST-OPERATIVE DIAGNOSIS:  INCISIONAL HERNIA  PROCEDURE:  Procedure(s): LAPAROSCOPIC INCISIONAL HERNIA REPAR WITH MESH - 17CM X 22CM VENTRALIGHT  SURGEON:  Surgeon(s): Georganna Skeans, MD  ASSISTANTS: none   ANESTHESIA:   local and general  EBL:  Total I/O In: 900 [I.V.:800; IV Piggyback:100] Out: 210 [Urine:200; Blood:10]  BLOOD ADMINISTERED:none  DRAINS: none   SPECIMEN:  No Specimen  DISPOSITION OF SPECIMEN:  N/A  COUNTS:  YES  DICTATION: .Dragon Dictation Findings: Incisional hernia above the umbilicus containing omentum  Procedure in detail: Informed consent was obtained.  He received intravenous antibiotics.  He was brought to the operating room and general endotracheal anesthesia was administered by the anesthesia staff.  His abdomen was prepped and draped in a sterile fashion.  We did a timeout procedure.  I injected local and made a small incision in the left upper quadrant at the costal margin.  I then placed a 5 mm Optiview port in standard fashion without difficulty.  The abdomen was insufflated with carbon dioxide.  I placed the camera through the port and there was no evidence of any complications from port insertion.  The abdomen was explored and the hernia was visualized to contain omentum.  Under direct vision, a 12 mm left lower quadrant, a 5 mm right upper quadrant, and a 5 mm right lower quadrant port were placed.  Local was used at each port site.  The omentum was then dissected down out of the hernia sac using blunt dissection and cautery.  The falciform ligament was going to get in the way of the superior portion of the mesh so it was divided and a portion of it excised using the LigaSure.  Excellent hemostasis was obtained.  The hernia defect was measured out and in order to provide at least 4 cm circumferential inlay past the hernia borders, I  selected a 17 x 22 cm Ventraight.  It was marked appropriately for orientation and I placed 4-0 Prolene sutures in it at 12:00, 3:00, 6:00, 9:00 positions.  I reinspected the abdomen.  The omentum that had reduced out of the hernia was not bleeding.  I rolled up the mesh after soaking it in saline and inserted into the abdomen via the left lower quadrant port site.  It was unfurled and positioned appropriately.  I made small stab incisions for each set of sutures and grabbed them individually with the PMI.  The sutures were all tied and the mesh was brought up against the abdominal wall.  I then placed 2 concentric circles of secure straps to secure the mesh.  It laid out completely flat and cover the hernia defect well.  I then inspected the abdomen thoroughly in 4 quadrants.  The omentum was hemostatic.  There was no bleeding or evidence of complication.  Ports were removed under direct vision.  Pneumoperitoneum was released.  Port sites were irrigated and the skin of each was closed with 4-0 Vicryl.  Dermabond was used to close the small stab wounds as well as the port sites.  All counts were correct.  He tolerated procedure well without apparent complication and was taken to recovery in stable condition. PATIENT DISPOSITION:  PACU - hemodynamically stable.   Delay start of Pharmacological VTE agent (>24hrs) due to surgical blood loss or risk of bleeding:  no  Georganna Skeans, MD, MPH, FACS Pager: [phone]  11/8/202110:55 AM

## 2020-03-24 NOTE — Anesthesia Procedure Notes (Signed)
Procedure Name: Intubation Date/Time: 03/24/2020 9:15 AM Performed by: Inda Coke, CRNA Pre-anesthesia Checklist: Patient identified, Emergency Drugs available, Suction available and Patient being monitored Patient Re-evaluated:Patient Re-evaluated prior to induction Oxygen Delivery Method: Circle System Utilized Preoxygenation: Pre-oxygenation with 100% oxygen Induction Type: IV induction and Rapid sequence Laryngoscope Size: Mac and 4 Grade View: Grade I Tube type: Oral Tube size: 7.5 mm Number of attempts: 1 Airway Equipment and Method: Stylet and Oral airway Placement Confirmation: ETT inserted through vocal cords under direct vision,  positive ETCO2 and breath sounds checked- equal and bilateral Secured at: 22 cm Tube secured with: Tape Dental Injury: Teeth and Oropharynx as per pre-operative assessment

## 2020-03-24 NOTE — Anesthesia Postprocedure Evaluation (Signed)
Anesthesia Post Note  Patient: Terry Parks  Procedure(s) Performed: LAPAROSCOPIC INCISIONAL HERNIA REPAR WITH MESH (N/A Abdomen)     Patient location during evaluation: PACU Anesthesia Type: General Level of consciousness: awake Pain management: pain level controlled Vital Signs Assessment: post-procedure vital signs reviewed and stable Respiratory status: spontaneous breathing Cardiovascular status: stable Postop Assessment: no apparent nausea or vomiting Anesthetic complications: no   No complications documented.  Last Vitals:  Vitals:   03/24/20 1200 03/24/20 1230  BP: 132/89 127/88  Pulse: 80 81  Resp: 14 13  Temp:    SpO2: 100% 100%    Last Pain:  Vitals:   03/24/20 1230  TempSrc:   PainSc: Asleep                 Jakaylah Schlafer

## 2020-03-24 NOTE — Transfer of Care (Signed)
Immediate Anesthesia Transfer of Care Note  Patient: Terry Parks  Procedure(s) Performed: LAPAROSCOPIC INCISIONAL HERNIA REPAR WITH MESH (N/A Abdomen)  Patient Location: PACU  Anesthesia Type:General  Level of Consciousness: awake and alert   Airway & Oxygen Therapy: Patient Spontanous Breathing and Patient connected to face mask oxygen  Post-op Assessment: Report given to RN and Post -op Vital signs reviewed and stable  Post vital signs: Reviewed and stable  Last Vitals:  Vitals Value Taken Time  BP 164/97 03/24/20 1101  Temp    Pulse 73 03/24/20 1105  Resp 11 03/24/20 1105  SpO2 100 % 03/24/20 1105  Vitals shown include unvalidated device data.  Last Pain:  Vitals:   03/24/20 0759  TempSrc:   PainSc: 0-No pain         Complications: No complications documented.

## 2020-03-24 NOTE — H&P (Addendum)
Terry Parks is an 64 y.o. male.   Chief Complaint:incisional hernia HPI: Presents for laparoscopic repair incisional hernia with mesh. Had cardiac clearance. Held his Xarelto for 3d. No change in symptoms since I saw him in the office.  Past Medical History:  Diagnosis Date  . Abscessed tooth    top back large cavity no pain or drainage, one on bottom  pt pulled tooth 4-5 months ago, right top large hole in tooth  . AKI (acute kidney injury) ([place])   . Allergic rhinitis   . Anemia   . Anxiety   . Asthma   . Atrial fibrillation ([place])   . BPH (benign prostatic hypertrophy)    Massive BPH noted on cystoscopy 1/23/ 2012 by Dr. Risa Grill.  . Cancer [facility])    prostate cancer 2019  . Cardiomyopathy ([place])   . CHF (congestive heart failure) ([place])   . Cough 03/30/2012  . Depression   . Diabetes mellitus 04/08/2008   type 2  . Dyspnea   . Dysrhythmia 2019  . Foley catheter in place 07-05-17 placed  . Fracture, orbital ([place]) 2021   Right  . GERD (gastroesophageal reflux disease) 2020  . Headache(784.0)    hx migraines none recent  . Hyperlipemia   . Hypertension   . Hypertensive cardiopathy 03/01/2006   2-D echocardiogram 02/01/2012 showed moderate LVH, mildly to moderately reduced left ventricular systolic function with an estimated ejection fraction of 40-45%, and diffuse hypokinesis.  A nuclear medicine stress study done 01/31/2012 showed no reversible ischemia, a small mid anterior wall fixed defect/infarct, and ejection fraction 42%.      . Neck pain   . Nephrolithiasis 05/29/2010   CT scan of abdomen/pelvis on 05/29/2010 showed an obstructing approximate 1-2 mm calculus at the left UVJ, and an approximate 1-2 mm left lower pole renal calculus.   Patient had continuing severe pain , and an elevation of his serum creatinine to a value of 1.75 on 06/06/2010.  Patient underwent cystoscopy on 06/08/2010 by Dr. Risa Grill, but attempts at retrograde pyelogram and ureteroscopy were unsucc  . Numbness  01/08/2018  . Obstructive sleep apnea 03/06/2008   Sleep study 03/06/08 showed severe OSA/hypopnea syndrome, with successful CPAP titration to 13 CWP using a medium ResMed Mirage Quattro full face mask with heated humidifier.   . Rash 04/17/2014  . Renal calculus 05/29/2010   CT scan of abdomen/pelvis on 05/29/2010 showed an obstructing approximate 1-2 mm calculus at the left UVJ, and an approximate 1-2 mm left lower pole renal calculus.   Patient had continuing severe pain , and an elevation of his serum creatinine to a value of 1.75 on 06/06/2010.  The stone had apparently passed and was not seen on repeat CT 06/08/2010.  . Sleep apnea    haven't use cpap in 2 years  . Tooth pain 10/29/2017  . Urinary straining 11/02/2016    Past Surgical History:  Procedure Laterality Date  . big toe nails removed Bilateral 20 yrs ago  . CARDIOVERSION N/A 06/08/2017   Procedure: CARDIOVERSION;  Surgeon: Josue Hector, MD;  Location: [facility] ENDOSCOPY;  Service: Cardiovascular;  Laterality: N/A;  . COLONOSCOPY    . CYSTOSCOPY W/ RETROGRADES    . FRACTURE SURGERY Right 2021   5th right phalange  . IR RADIOLOGIST EVAL & MGMT  02/02/2018  . LYMPHADENECTOMY Bilateral 10/06/2017   Procedure: LYMPHADENECTOMY;  Surgeon: Lucas Mallow, MD;  Location: [facility] ORS;  Service: Urology;  Laterality: Bilateral;  . ROBOT ASSISTED LAPAROSCOPIC RADICAL  PROSTATECTOMY N/A 10/06/2017   Procedure: XI ROBOTIC ASSISTED LAPAROSCOPIC RADICAL PROSTATECTOMY;  Surgeon: Lucas Mallow, MD;  Location: [facility] ORS;  Service: Urology;  Laterality: N/A;  . TRANSURETHRAL RESECTION OF BLADDER TUMOR N/A 07/13/2017   Procedure: TRANSURETHRAL RESECTION OF PROSTATE;  Surgeon: Lucas Mallow, MD;  Location: [facility] ORS;  Service: Urology;  Laterality: N/A;    Family History  Problem Relation Age of Onset  . Breast cancer Mother   . Hypertension Father   . Diabetes Maternal Grandmother   . Colon cancer Neg Hx   . Prostate cancer Neg Hx   . Heart attack  Neg Hx   . Esophageal cancer Neg Hx   . Inflammatory bowel disease Neg Hx   . Liver disease Neg Hx   . Pancreatic cancer Neg Hx   . Rectal cancer Neg Hx   . Stomach cancer Neg Hx    Social History:  reports that he has never smoked. He has never used smokeless tobacco. He reports that he does not drink alcohol and does not use drugs.  Allergies:  Allergies  Allergen Reactions  . Lisinopril Cough    Medications Prior to Admission  Medication Sig Dispense Refill  . acetaminophen (TYLENOL) 325 MG tablet Take 650 mg by mouth every 6 (six) hours as needed for moderate pain.     [name] amiodarone (PACERONE) 200 MG tablet TAKE 1 TABLET BY MOUTH DAILY. (Patient taking differently: Take 200 mg by mouth daily. ) 30 tablet 1  . atorvastatin (LIPITOR) 40 MG tablet Take 1 tablet (40 mg total) by mouth daily. 90 tablet 3  . carvedilol (COREG) 25 MG tablet Take 1 tablet (25 mg total) by mouth 2 (two) times daily with a meal. 180 tablet 1  . empagliflozin (JARDIANCE) 10 MG TABS tablet Take 1 tablet (10 mg total) by mouth daily before breakfast. 30 tablet 2  . ibuprofen (ADVIL) 200 MG tablet Take 800 mg by mouth every 8 (eight) hours as needed for moderate pain.    . Insulin Degludec-Liraglutide (XULTOPHY) 100-3.6 UNIT-MG/ML SOPN Inject 50 Units into the skin daily. (Patient taking differently: Inject 52 Units into the skin at bedtime. ) 5 pen 11  . metFORMIN (GLUCOPHAGE XR) 500 MG 24 hr tablet Take 4 tablets (2,000 mg total) by mouth daily with breakfast. (Patient taking differently: Take 2,000 mg by mouth 2 (two) times a week. ) 360 tablet 3  . nystatin (NYSTATIN) powder Apply 1 application topically 3 (three) times daily. (Patient taking differently: Apply 1 application topically daily as needed (rash). ) 60 g 1  . omeprazole (PRILOSEC) 40 MG capsule Take 1 capsule (40 mg total) by mouth 2 (two) times daily before a meal. 60 capsule 3  . rivaroxaban (XARELTO) 20 MG TABS tablet Take 1 tablet (20 mg total) by  mouth daily with breakfast. 90 tablet 0  . sacubitril-valsartan (ENTRESTO) 97-103 MG Take 1 tablet by mouth 2 (two) times daily. 180 tablet 1  . spironolactone (ALDACTONE) 50 MG tablet Take 1 tablet (50 mg total) by mouth daily. 30 tablet 0  . torsemide (DEMADEX) 20 MG tablet Take 2 tablets (40 mg total) by mouth daily. (Patient taking differently: Take 20 mg by mouth 2 (two) times daily. ) 180 tablet 0  . Accu-Chek Softclix Lancets lancets Check blood sugar three times a day as instructed 300 each 3  . Blood Glucose Monitoring Suppl (ACCU-CHEK AVIVA) device Use as instructed to check blood sugar up to 3 times a day  1 each 0  . 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 insulin dependent 300 each 11  . Insulin Pen Needle (CARETOUCH PEN NEEDLES) 31G X 6 MM MISC 1 pen by Does not apply route at bedtime. 100 each 3    No results found for this or any previous visit (from the past 48 hour(s)). No results found.  Review of Systems  Blood pressure (!) 148/94, pulse 91, temperature 98.2 F (36.8 C), temperature source Oral, resp. rate 18, SpO2 100 %. Physical Exam HENT:     Head: Normocephalic.  Eyes:     Pupils: Pupils are equal, round, and reactive to light.  Cardiovascular:     Rate and Rhythm: Normal rate and regular rhythm.     Pulses: Normal pulses.     Heart sounds: Normal heart sounds.  Pulmonary:     Effort: Pulmonary effort is normal.     Breath sounds: Normal breath sounds.  Abdominal:     Palpations: Abdomen is soft.     Hernia: A hernia is present.     Comments: Supraumbilical umbilical hernia reducible  Skin:    General: Skin is warm and dry.  Neurological:     Mental Status: He is alert and oriented to person, place, and time.  Psychiatric:        Mood and Affect: Mood normal.      Assessment/Plan Incisional hernia - for laparoscopic repair incisional hernia with mesh. Procedure, risks, and benefits discussed  and he agrees. I also discussed the expected post-op course. Plan 23h OBS.  Zenovia Jarred, MD 03/24/2020, 8:48 AM

## 2020-03-24 NOTE — Interval H&P Note (Signed)
History and Physical Interval Note:  03/24/2020 8:52 AM  [name]  has presented today for surgery, with the diagnosis of INCISIONAL HERNIA.  The various methods of treatment have been discussed with the patient and family. After consideration of risks, benefits and other options for treatment, the patient has consented to  Procedure(s): [place] (N/A) as a surgical intervention.  The patient's history has been reviewed, patient examined, no change in status, stable for surgery.  I have reviewed the patient's chart and labs.  Questions were answered to the patient's satisfaction.     Terry Parks

## 2020-03-25 ENCOUNTER — Encounter (HOSPITAL_COMMUNITY): Payer: Self-pay | Admitting: General Surgery

## 2020-03-25 ENCOUNTER — Ambulatory Visit: Payer: Medicaid Other | Admitting: *Deleted

## 2020-03-25 DIAGNOSIS — Z888 Allergy status to other drugs, medicaments and biological substances status: Secondary | ICD-10-CM | POA: Diagnosis not present

## 2020-03-25 DIAGNOSIS — Z9079 Acquired absence of other genital organ(s): Secondary | ICD-10-CM | POA: Diagnosis not present

## 2020-03-25 DIAGNOSIS — N400 Enlarged prostate without lower urinary tract symptoms: Secondary | ICD-10-CM | POA: Diagnosis present

## 2020-03-25 DIAGNOSIS — Z87442 Personal history of urinary calculi: Secondary | ICD-10-CM | POA: Diagnosis not present

## 2020-03-25 DIAGNOSIS — I130 Hypertensive heart and chronic kidney disease with heart failure and stage 1 through stage 4 chronic kidney disease, or unspecified chronic kidney disease: Secondary | ICD-10-CM | POA: Diagnosis present

## 2020-03-25 DIAGNOSIS — G4730 Sleep apnea, unspecified: Secondary | ICD-10-CM | POA: Diagnosis present

## 2020-03-25 DIAGNOSIS — Z794 Long term (current) use of insulin: Secondary | ICD-10-CM | POA: Diagnosis not present

## 2020-03-25 DIAGNOSIS — J45909 Unspecified asthma, uncomplicated: Secondary | ICD-10-CM | POA: Diagnosis present

## 2020-03-25 DIAGNOSIS — N179 Acute kidney failure, unspecified: Secondary | ICD-10-CM | POA: Diagnosis present

## 2020-03-25 DIAGNOSIS — E1122 Type 2 diabetes mellitus with diabetic chronic kidney disease: Secondary | ICD-10-CM | POA: Diagnosis present

## 2020-03-25 DIAGNOSIS — Z9889 Other specified postprocedural states: Secondary | ICD-10-CM | POA: Diagnosis not present

## 2020-03-25 DIAGNOSIS — Z7984 Long term (current) use of oral hypoglycemic drugs: Secondary | ICD-10-CM | POA: Diagnosis not present

## 2020-03-25 DIAGNOSIS — I428 Other cardiomyopathies: Secondary | ICD-10-CM | POA: Diagnosis not present

## 2020-03-25 DIAGNOSIS — I5042 Chronic combined systolic (congestive) and diastolic (congestive) heart failure: Secondary | ICD-10-CM

## 2020-03-25 DIAGNOSIS — N183 Chronic kidney disease, stage 3 unspecified: Secondary | ICD-10-CM

## 2020-03-25 DIAGNOSIS — E1142 Type 2 diabetes mellitus with diabetic polyneuropathy: Secondary | ICD-10-CM

## 2020-03-25 DIAGNOSIS — E785 Hyperlipidemia, unspecified: Secondary | ICD-10-CM | POA: Diagnosis present

## 2020-03-25 DIAGNOSIS — F419 Anxiety disorder, unspecified: Secondary | ICD-10-CM | POA: Diagnosis present

## 2020-03-25 DIAGNOSIS — F32A Depression, unspecified: Secondary | ICD-10-CM | POA: Diagnosis present

## 2020-03-25 DIAGNOSIS — I4819 Other persistent atrial fibrillation: Secondary | ICD-10-CM

## 2020-03-25 DIAGNOSIS — E1159 Type 2 diabetes mellitus with other circulatory complications: Secondary | ICD-10-CM

## 2020-03-25 DIAGNOSIS — I480 Paroxysmal atrial fibrillation: Secondary | ICD-10-CM | POA: Diagnosis present

## 2020-03-25 DIAGNOSIS — Z79899 Other long term (current) drug therapy: Secondary | ICD-10-CM | POA: Diagnosis not present

## 2020-03-25 DIAGNOSIS — K432 Incisional hernia without obstruction or gangrene: Secondary | ICD-10-CM | POA: Diagnosis present

## 2020-03-25 DIAGNOSIS — Z8546 Personal history of malignant neoplasm of prostate: Secondary | ICD-10-CM | POA: Diagnosis not present

## 2020-03-25 DIAGNOSIS — Z6841 Body Mass Index (BMI) 40.0 and over, adult: Secondary | ICD-10-CM | POA: Diagnosis not present

## 2020-03-25 DIAGNOSIS — G4733 Obstructive sleep apnea (adult) (pediatric): Secondary | ICD-10-CM | POA: Diagnosis present

## 2020-03-25 DIAGNOSIS — K219 Gastro-esophageal reflux disease without esophagitis: Secondary | ICD-10-CM | POA: Diagnosis present

## 2020-03-25 LAB — BASIC METABOLIC PANEL
Anion gap: 11 (ref 5–15)
BUN: 22 mg/dL (ref 8–23)
CO2: 26 mmol/L (ref 22–32)
Calcium: 8.1 mg/dL — ABNORMAL LOW (ref 8.9–10.3)
Chloride: 98 mmol/L (ref 98–111)
Creatinine, Ser: 2.13 mg/dL — ABNORMAL HIGH (ref 0.61–1.24)
GFR, Estimated: 34 mL/min — ABNORMAL LOW (ref >60)
Glucose, Bld: 233 mg/dL — ABNORMAL HIGH (ref 70–99)
Potassium: 3.8 mmol/L (ref 3.5–5.1)
Sodium: 135 mmol/L (ref 135–145)

## 2020-03-25 LAB — CBC
HCT: 39.1 % (ref 39.0–52.0)
Hemoglobin: 12.7 g/dL — ABNORMAL LOW (ref 13.0–17.0)
MCH: 27.8 pg (ref 26.0–34.0)
MCHC: 32.5 g/dL (ref 30.0–36.0)
MCV: 85.6 fL (ref 80.0–100.0)
Platelets: 182 10*3/uL (ref 150–400)
RBC: 4.57 MIL/uL (ref 4.22–5.81)
RDW: 13.8 % (ref 11.5–15.5)
WBC: 11.4 10*3/uL — ABNORMAL HIGH (ref 4.0–10.5)
nRBC: 0 % (ref 0.0–0.2)

## 2020-03-25 LAB — GLUCOSE, CAPILLARY
Glucose-Capillary: 279 mg/dL — ABNORMAL HIGH (ref 70–99)
Glucose-Capillary: 273 mg/dL — ABNORMAL HIGH (ref 70–99)

## 2020-03-25 MED ORDER — INSULIN GLARGINE 100 UNIT/ML ~~LOC~~ SOLN
52.0000 [IU] | Freq: Every day | SUBCUTANEOUS | Status: DC
  Filled 2020-03-25 (×2): qty 0.52

## 2020-03-25 MED ORDER — OXYCODONE HCL 5 MG PO TABS: 5.0000 mg | ORAL_TABLET | ORAL | Status: DC | PRN

## 2020-03-25 NOTE — Progress Notes (Signed)
1 Day Post-Op   Subjective/Chief Complaint: Sore Tolerated PO Has not been UOB   Objective: Vital signs in last 24 hours: Temp:  [97.3 F (36.3 C)-98.2 F (36.8 C)] 97.7 F (36.5 C) (11/09 0546) Pulse Rate:  [73-91] 73 (11/09 0546) Resp:  [9-18] 18 (11/09 0546) BP: (125-164)/(65-100) 130/83 (11/09 0546) SpO2:  [95 %-100 %] 95 % (11/09 0546) Last BM Date: 03/22/20  Intake/Output from previous day: 11/08 0701 - 11/09 0700 In: 1460 [P.O.:480; I.V.:880; IV Piggyback:100] Out: 1260 [Urine:1250; Blood:10] Intake/Output this shift: No intake/output data recorded.  General appearance: cooperative Resp: clear to auscultation bilaterally Cardio: irregularly irregular rhythm GI: soft, incisions CDI, binder replaced  Lab Results:  Recent Labs    03/25/20 0518  WBC 11.4*  HGB 12.7*  HCT 39.1  PLT 182   BMET Recent Labs    03/25/20 0518  NA 135  K 3.8  CL 98  CO2 26  GLUCOSE 233*  BUN 22  CREATININE 2.13*  CALCIUM 8.1*   PT/INR No results for input(s): LABPROT, INR in the last 72 hours. ABG No results for input(s): PHART, HCO3 in the last 72 hours.  Invalid input(s): PCO2, PO2  Studies/Results: No results found.  Anti-infectives: Anti-infectives (From admission, onward)   Start     Dose/Rate Route Frequency Ordered Stop   03/24/20 0845  ceFAZolin (ANCEF) 3 g in dextrose 5 % 50 mL IVPB  Status:  Discontinued        3 g 100 mL/hr over 30 Minutes Intravenous On call to O.R. 03/21/20 1049 03/24/20 1439      Assessment/Plan: S/P laparoscopic repair incisional hernia with mesh 11/8 - mobilize IDDM - resume home dose insulin AKI - likely pre-renal from being NPO for surgery. 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

## 2020-03-25 NOTE — Chronic Care Management (AMB) (Signed)
  Care Management   Note  03/25/2020 Name: Terry Parks MRN: [id number] DOB: 06-15-1955   Patient was scheduled for transition of care call today however patient remains hospitalized after incisional hernia repair yesterday and will likely be discharged on 03/26/20. Will plan to complete transition of care call within 48 hours of discharge from hospital.   The care management team will reach out to the patient again over the next 1-3 days.   [name] RN, CCM, [facility] RN Care Manager [phone]

## 2020-03-26 ENCOUNTER — Other Ambulatory Visit: Payer: Self-pay | Admitting: Cardiology

## 2020-03-26 DIAGNOSIS — Z8719 Personal history of other diseases of the digestive system: Secondary | ICD-10-CM

## 2020-03-26 DIAGNOSIS — Z9889 Other specified postprocedural states: Secondary | ICD-10-CM

## 2020-03-26 DIAGNOSIS — N179 Acute kidney failure, unspecified: Secondary | ICD-10-CM

## 2020-03-26 DIAGNOSIS — I480 Paroxysmal atrial fibrillation: Secondary | ICD-10-CM

## 2020-03-26 DIAGNOSIS — I428 Other cardiomyopathies: Secondary | ICD-10-CM

## 2020-03-26 DIAGNOSIS — N183 Chronic kidney disease, stage 3 unspecified: Secondary | ICD-10-CM

## 2020-03-26 LAB — BASIC METABOLIC PANEL
Anion gap: 11 (ref 5–15)
BUN: 28 mg/dL — ABNORMAL HIGH (ref 8–23)
CO2: 25 mmol/L (ref 22–32)
Calcium: 7.7 mg/dL — ABNORMAL LOW (ref 8.9–10.3)
Chloride: 100 mmol/L (ref 98–111)
Creatinine, Ser: 2.11 mg/dL — ABNORMAL HIGH (ref 0.61–1.24)
GFR, Estimated: 35 mL/min — ABNORMAL LOW (ref >60)
Glucose, Bld: 331 mg/dL — ABNORMAL HIGH (ref 70–99)
Potassium: 3.5 mmol/L (ref 3.5–5.1)
Sodium: 136 mmol/L (ref 135–145)

## 2020-03-26 LAB — MRSA PCR SCREENING: MRSA by PCR: POSITIVE — AB

## 2020-03-26 LAB — GLUCOSE, CAPILLARY
Glucose-Capillary: 233 mg/dL — ABNORMAL HIGH (ref 70–99)
Glucose-Capillary: 223 mg/dL — ABNORMAL HIGH (ref 70–99)
Glucose-Capillary: 199 mg/dL — ABNORMAL HIGH (ref 70–99)
Glucose-Capillary: 261 mg/dL — ABNORMAL HIGH (ref 70–99)

## 2020-03-26 MED ORDER — HYDROMORPHONE HCL 1 MG/ML IJ SOLN: 0.5000 mg | INTRAMUSCULAR | Status: DC | PRN

## 2020-03-26 MED ORDER — TORSEMIDE 20 MG PO TABS: 20.0000 mg | ORAL_TABLET | Freq: Every day | ORAL | 0 refills | Status: DC

## 2020-03-26 MED ORDER — CHLORHEXIDINE GLUCONATE CLOTH 2 % EX PADS: 6.0000 | MEDICATED_PAD | Freq: Every day | CUTANEOUS | Status: DC

## 2020-03-26 MED ORDER — MUPIROCIN 2 % EX OINT
1.0000 "application " | TOPICAL_OINTMENT | Freq: Two times a day (BID) | CUTANEOUS | Status: DC
  Administered 2020-03-26: 1 via NASAL
  Filled 2020-03-26: qty 22

## 2020-03-26 MED ORDER — DOCUSATE SODIUM 100 MG PO CAPS: 100.0000 mg | ORAL_CAPSULE | Freq: Two times a day (BID) | ORAL | 2 refills | Status: AC

## 2020-03-26 MED ORDER — OXYCODONE HCL 5 MG PO TABS
5.0000 mg | ORAL_TABLET | Freq: Four times a day (QID) | ORAL | 0 refills | Status: DC | PRN
Start: 2020-03-26 — End: 2020-07-18

## 2020-03-26 MED FILL — TORSEMIDE 20 MG TABLET: 20 | 14 days supply | Qty: 14 | Fill #0

## 2020-03-26 MED FILL — oxyCODONE HCL 5 MG TABS: 5 | 5 days supply | Qty: 20 | Fill #0

## 2020-03-26 NOTE — Progress Notes (Signed)
2 Days Post-Op   Subjective/Chief Complaint: Moving around better, eating, urinating without difficulty   Objective: Vital signs in last 24 hours: Temp:  [97.9 F (36.6 C)-98.5 F (36.9 C)] 98.5 F (36.9 C) (11/10 0441) Pulse Rate:  [74-80] 79 (11/10 0441) Resp:  [18] 18 (11/10 0441) BP: (114-138)/(68-87) 119/76 (11/10 0441) SpO2:  [97 %-99 %] 99 % (11/10 0441) Weight:  [145.2 kg] 145.2 kg (11/09 1600) Last BM Date: 03/22/20  Intake/Output from previous day: 11/09 0701 - 11/10 0700 In: 1739.6 [P.O.:750; I.V.:989.6] Out: 1580 [Urine:1580] Intake/Output this shift: No intake/output data recorded.  General appearance: cooperative Resp: clear to auscultation bilaterally Cardio: irregularly irregular rhythm GI: soft, incisions CDI, binder reapplied  Lab Results:  Recent Labs    03/25/20 0518  WBC 11.4*  HGB 12.7*  HCT 39.1  PLT 182   BMET Recent Labs    03/25/20 0518 03/26/20 0209  NA 135 136  K 3.8 3.5  CL 98 100  CO2 26 25  GLUCOSE 233* 331*  BUN 22 28*  CREATININE 2.13* 2.11*  CALCIUM 8.1* 7.7*   PT/INR No results for input(s): LABPROT, INR in the last 72 hours. ABG No results for input(s): PHART, HCO3 in the last 72 hours.  Invalid input(s): PCO2, PO2  Studies/Results: No results found.  Anti-infectives: Anti-infectives (From admission, onward)   Start     Dose/Rate Route Frequency Ordered Stop   03/24/20 0845  ceFAZolin (ANCEF) 3 g in dextrose 5 % 50 mL IVPB  Status:  Discontinued        3 g 100 mL/hr over 30 Minutes Intravenous On call to O.R. 03/21/20 1049 03/24/20 1439      Assessment/Plan: S/P laparoscopic repair incisional hernia with mesh 11/8 - doing well IDDM - resumed home dose insulin, SSI AKI - CRT still 2.1 despite IVF 50/h, see below Combined heart failure - had to hold Entresto, spironolactone, and torsemide yesterday due to CRT, I will consult Cardiology in order to help manage heart failure with increased CRT PAF - Xarelto  held for OR VTE - Lovenox, resume Xarelto at D/C (I am concerned to resume with elevated CRT) Dispo - pending above  LOS: 1 day    Terry Parks 03/26/2020

## 2020-03-26 NOTE — Discharge Instructions (Addendum)
May shower beginning 03/27/2020. Do not peel off or scrub skin glue. May allow warm soapy water to run over incisions, then rinse and pat dry. Do not soak in any water (tubs, hot tubs, pools, lakes, oceans) for one week.   No lifting greater than 5 pounds for six weeks. Wear abdominal binder at all times unless bathing/showering. Take the stool softener as prescribed to prevent constipation and straining.   Call the office at [phone] for temperature greater than 101.40F, worsening pain, redness or warmth at the incision site.  Please call [phone] to make an appointment for 2-3 weeks after surgery for wound check.   Keep your Cardiology appointment scheduled for 04/01/2020 at 11:15 AM. STOP taking spironolactone until follow up appointment with Cardiology. Your torsemide dose has been lowered to 20mg  daily until your follow-up appointment with cardiology. Resume Entresto and Xarelto starting 03/27/2020.

## 2020-03-26 NOTE — Consult Note (Addendum)
Cardiology Consultation:   Patient ID: Coben Godshall; [id number]; [birth year]   Admit date: 03/24/2020 Date of Consult: 03/26/2020  Primary Care Provider: [name], MD Primary Cardiologist: Dr. Sallyanne Kuster, MD   Patient Profile:   Phuc Kluttz is a 64 y.o. male with a hx of chronic combined systolic and diastolic heart failure with an LVEF at 35 to 40% per echocardiogram 02/2019, paroxysmal atrial fibrillation, HTN, obesity, OSA on CPAP and DM2 who is being seen today for the evaluation of HF medications at the request of Dr. Grandville Silos.  History of Present Illness:   Mr. Asebedo is a 64 year old male with a history as stated above who presented for laparoscopic hernia repair.   Mr. Liberatore is followed by Dr. Sallyanne Kuster for his cardiology care.  He was initially diagnosed with CHF in 2013 after undergoing a stress test that showed a fixed nonreversible anterior defect and an ejection fraction at 42% with subsequent echocardiogram showing an LVEF of 40 to 45% with diffuse hypokinesis and no regional wall motion abnormalities.  He had a repeat stress test 11/2014 which showed an LVEF at 25% with diaphragmatic attenuation with mild LV dilatation and severe global hypokinesis consistent with nonischemic cardiomyopathy. He has had multiple echocardiogram follow-up studies with no real normalization of LV function.   He was hospitalized with acute CHF exacerbation 05/2017 in the setting of AKI secondary to nephrolithiasis and urethral obstruction. During that time, he had an episode of atrial fibrillation treated with DCCV cardioversion followed by recurrence of atrial flutter which was then treated with amiodarone and anticoagulation with Xarelto. He had an additional hospitalization 07/2019 for CHF felt to be secondary to sodium indiscretions.  He was diuresed approximately 7 L with IV Lasix with a discharge weight at 318lb.   He was seen by Dr. Sallyanne Kuster 01/11/2020 in follow-up without  significant concern.  He was transitioned from losartan to [place] at that time. He was maintaining NSR. Cardiomyopathy felt to be secondary to poorly controlled BP however he has never formally undergone ischemic work-up therefore is presumed nonischemic cardiomyopathy.  Our team was contacted 02/06/2020 for preoperative cardiac clearance prior to laparoscopic incisional hernia repair with mesh.  His Xarelto was held for 3 days prior to procedure and procedure was performed 03/24/20. He did well with surgery however renal function began to rise felt to be from NPO prior to surgery. He was given gentle IVF hydration with follow up labs.    On day 2 post-op, creatinine continued to rise despite IVF to 2.1. [name] was held along with spironolactone and torsemide>>last dose of torsemide 03/25/20 at 0800 and [name] and [name] 03/24/20 at 3pm and 10pm. Xarelto now to be held until discharge out of concern for AKI.   Past Medical History:  Diagnosis Date  . Abscessed tooth    top back large cavity no pain or drainage, one on bottom  pt pulled tooth 4-5 months ago, right top large hole in tooth  . AKI (acute kidney injury) ([place])   . Allergic rhinitis   . Anemia   . Anxiety   . Asthma   . Atrial fibrillation ([place])   . BPH (benign prostatic hypertrophy)    Massive BPH noted on cystoscopy 1/23/ 2012 by Dr. Risa Grill.  . Cancer [facility])    prostate cancer 2019  . Cardiomyopathy ([place])   . CHF (congestive heart failure) ([place])   . Cough 03/30/2012  . Depression   . Diabetes mellitus 04/08/2008   type 2  . Dyspnea   .  Dysrhythmia 2019  . Foley catheter in place 07-05-17 placed  . Fracture, orbital ([place]) 2021   Right  . GERD (gastroesophageal reflux disease) 2020  . Headache(784.0)    hx migraines none recent  . Hyperlipemia   . Hypertension   . Hypertensive cardiopathy 03/01/2006   2-D echocardiogram 02/01/2012 showed moderate LVH, mildly to moderately reduced left ventricular systolic function with an  estimated ejection fraction of 40-45%, and diffuse hypokinesis.  A nuclear medicine stress study done 01/31/2012 showed no reversible ischemia, a small mid anterior wall fixed defect/infarct, and ejection fraction 42%.      . Neck pain   . Nephrolithiasis 05/29/2010   CT scan of abdomen/pelvis on 05/29/2010 showed an obstructing approximate 1-2 mm calculus at the left UVJ, and an approximate 1-2 mm left lower pole renal calculus.   Patient had continuing severe pain , and an elevation of his serum creatinine to a value of 1.75 on 06/06/2010.  Patient underwent cystoscopy on 06/08/2010 by Dr. Risa Grill, but attempts at retrograde pyelogram and ureteroscopy were unsucc  . Numbness 01/08/2018  . Obstructive sleep apnea 03/06/2008   Sleep study 03/06/08 showed severe OSA/hypopnea syndrome, with successful CPAP titration to 13 CWP using a medium ResMed Mirage Quattro full face mask with heated humidifier.   . Rash 04/17/2014  . Renal calculus 05/29/2010   CT scan of abdomen/pelvis on 05/29/2010 showed an obstructing approximate 1-2 mm calculus at the left UVJ, and an approximate 1-2 mm left lower pole renal calculus.   Patient had continuing severe pain , and an elevation of his serum creatinine to a value of 1.75 on 06/06/2010.  The stone had apparently passed and was not seen on repeat CT 06/08/2010.  . Sleep apnea    haven't use cpap in 2 years  . Tooth pain 10/29/2017  . Urinary straining 11/02/2016    Past Surgical History:  Procedure Laterality Date  . big toe nails removed Bilateral 20 yrs ago  . CARDIOVERSION N/A 06/08/2017   Procedure: CARDIOVERSION;  Surgeon: Josue Hector, MD;  Location: [facility]OSCOPY;  Service: Cardiovascular;  Laterality: N/A;  . COLONOSCOPY    . CYSTOSCOPY W/ RETROGRADES    . FRACTURE SURGERY Right 2021   5th right phalange  . INCISIONAL HERNIA REPAIR N/A 03/24/2020   Procedure: LAPAROSCOPIC INCISIONAL HERNIA REPAR WITH MESH;  Surgeon: Georganna Skeans, MD;  Location: [place];   Service: General;  Laterality: N/A;  . IR RADIOLOGIST EVAL & MGMT  02/02/2018  . LYMPHADENECTOMY Bilateral 10/06/2017   Procedure: LYMPHADENECTOMY;  Surgeon: Lucas Mallow, MD;  Location: [facility] ORS;  Service: Urology;  Laterality: Bilateral;  . ROBOT ASSISTED LAPAROSCOPIC RADICAL PROSTATECTOMY N/A 10/06/2017   Procedure: XI ROBOTIC ASSISTED LAPAROSCOPIC RADICAL PROSTATECTOMY;  Surgeon: Lucas Mallow, MD;  Location: [facility] ORS;  Service: Urology;  Laterality: N/A;  . TRANSURETHRAL RESECTION OF BLADDER TUMOR N/A 07/13/2017   Procedure: TRANSURETHRAL RESECTION OF PROSTATE;  Surgeon: Lucas Mallow, MD;  Location: [facility] ORS;  Service: Urology;  Laterality: N/A;     Prior to Admission medications   Medication Sig Start Date End Date Taking? Authorizing Provider  acetaminophen (TYLENOL) 325 MG tablet Take 650 mg by mouth every 6 (six) hours as needed for moderate pain.    Yes [provider]  amiodarone (PACERONE) 200 MG tablet TAKE 1 TABLET BY MOUTH DAILY. Patient taking differently: Take 200 mg by mouth daily.  12/05/19  Yes Bloomfield, Carley D, DO  atorvastatin (LIPITOR) 40 MG  tablet Take 1 tablet (40 mg total) by mouth daily. 01/11/20 04/10/20 Yes [name], [name], MD  carvedilol (COREG) 25 MG tablet Take 1 tablet (25 mg total) by mouth 2 (two) times daily with a meal. 12/26/18 02/06/21 Yes [name], [name], MD  empagliflozin (JARDIANCE) 10 MG TABS tablet Take 1 tablet (10 mg total) by mouth daily before breakfast. 12/12/19  Yes Bloomfield, Carley D, DO  ibuprofen (ADVIL) 200 MG tablet Take 800 mg by mouth every 8 (eight) hours as needed for moderate pain.   Yes [provider]  Insulin Degludec-Liraglutide (XULTOPHY) 100-3.6 UNIT-MG/ML SOPN Inject 50 Units into the skin daily. Patient taking differently: Inject 52 Units into the skin at bedtime.  08/31/19  Yes [name], [name], MD  metFORMIN (GLUCOPHAGE XR) 500 MG 24 hr tablet Take 4 tablets (2,000 mg total) by mouth daily with  breakfast. Patient taking differently: Take 2,000 mg by mouth 2 (two) times a week.  12/12/19 12/11/20 Yes Bloomfield, Carley D, DO  nystatin (NYSTATIN) powder Apply 1 application topically 3 (three) times daily. Patient taking differently: Apply 1 application topically daily as needed (rash).  01/10/20  Yes Andrew Au, MD  omeprazole (PRILOSEC) 40 MG capsule Take 1 capsule (40 mg total) by mouth 2 (two) times daily before a meal. 08/23/19  Yes Bloomfield, Carley D, DO  rivaroxaban (XARELTO) 20 MG TABS tablet Take 1 tablet (20 mg total) by mouth daily with breakfast. 08/31/19  Yes [name], Vahini, MD  sacubitril-valsartan (ENTRESTO) 97-103 MG Take 1 tablet by mouth 2 (two) times daily. 02/07/20  Yes [name], [name], MD  spironolactone (ALDACTONE) 50 MG tablet Take 1 tablet (50 mg total) by mouth daily. 12/05/19  Yes Bloomfield, Carley D, DO  torsemide (DEMADEX) 20 MG tablet Take 2 tablets (40 mg total) by mouth daily. Patient taking differently: Take 20 mg by mouth 2 (two) times daily.  12/12/19 03/29/20 Yes [name], MD  Accu-Chek Softclix Lancets lancets Check blood sugar three times a day as instructed 07/28/18   [name], MD  Blood Glucose Monitoring Suppl (ACCU-CHEK AVIVA) device Use as instructed to check blood sugar up to 3 times a day 06/20/19 06/19/20  [name], MD  glucose blood (ACCU-CHEK AVIVA) test strip Use to check blood sugar 3 times daily diag code E11.42. insulin dependent 07/20/18   Forde Dandy, PharmD  Insulin Pen Needle (CARETOUCH PEN NEEDLES) 31G X 6 MM MISC 1 pen by Does not apply route at bedtime. 06/21/18   [name], MD    Inpatient Medications: Scheduled Meds: . amiodarone  200 mg Oral Daily  . atorvastatin  40 mg Oral Daily  . carvedilol  25 mg Oral BID WC  . Chlorhexidine Gluconate Cloth  6 each Topical Q0600  . empagliflozin  10 mg Oral QAC breakfast  . enoxaparin (LOVENOX) injection  40 mg Subcutaneous Q24H  . gabapentin  300 mg Oral BID  . insulin  aspart  0-15 Units Subcutaneous TID WC  . insulin glargine  52 Units Subcutaneous QHS  . mupirocin ointment  1 application Nasal BID  . pantoprazole  40 mg Oral Daily   Continuous Infusions: . sodium chloride 50 mL/hr at 03/26/20 0203   PRN Meds: diphenhydrAMINE **OR** diphenhydrAMINE, hydrALAZINE, HYDROmorphone (DILAUDID) injection, ibuprofen, methocarbamol, ondansetron **OR** ondansetron (ZOFRAN) IV, oxyCODONE  Allergies:    Allergies  Allergen Reactions  . Lisinopril Cough    Social History:   Social History   Socioeconomic History  . Marital status: Single    Spouse name:  Not on file  . Number of children: 4  . Years of education: 71  . Highest education level: Not on file  Occupational History  . Occupation:        Employer: UNEMPLOYED  Tobacco Use  . Smoking status: Never Smoker  . Smokeless tobacco: Never Used  Vaping Use  . Vaping Use: Never used  Substance and Sexual Activity  . Alcohol use: No    Alcohol/week: 0.0 standard drinks  . Drug use: No  . Sexual activity: Not Currently  Other Topics Concern  . Not on file  Social History Narrative   Divorced, 4 children, lives alone.     Social Determinants of Health   Financial Resource Strain: Low Risk   . Difficulty of Paying Living Expenses: Not hard at all  Food Insecurity: No Food Insecurity  . Worried About Charity fundraiser in the Last Year: Never true  . Ran Out of Food in the Last Year: Never true  Transportation Needs: No Transportation Needs  . Lack of Transportation (Medical): No  . Lack of Transportation (Non-Medical): No  Physical Activity: Inactive  . Days of Exercise per Week: 0 days  . Minutes of Exercise per Session: 0 min  Stress:   . Feeling of Stress : Not on file  Social Connections: Unknown  . Frequency of Communication with Friends and Family: More than three times a week  . Frequency of Social Gatherings with Friends and Family: More than three times a week  . Attends  Religious Services: More than 4 times per year  . Active Member of Clubs or Organizations: Not on file  . Attends Archivist Meetings: Not on file  . Marital Status: Not on file  Intimate Partner Violence:   . Fear of Current or Ex-Partner: Not on file  . Emotionally Abused: Not on file  . Physically Abused: Not on file  . Sexually Abused: Not on file    Family History:   Family History  Problem Relation Age of Onset  . Breast cancer Mother   . Hypertension Father   . Diabetes Maternal Grandmother   . Colon cancer Neg Hx   . Prostate cancer Neg Hx   . Heart attack Neg Hx   . Esophageal cancer Neg Hx   . Inflammatory bowel disease Neg Hx   . Liver disease Neg Hx   . Pancreatic cancer Neg Hx   . Rectal cancer Neg Hx   . Stomach cancer Neg Hx    Family Status:  Family Status  Relation Name Status  . Mother  Deceased       dementia  . Father  Deceased at age 5       died of comps of BP  . Brother  Deceased at age 62       died of MI  . Brother  Alive  . Brother  Alive  . Brother  Alive  . Sister  Alive       BP  . Sister  Alive       BP  . MGM  (Not Specified)  . Neg Hx  (Not Specified)    ROS:  Please see the history of present illness.  All other ROS reviewed and negative.     Physical Exam/Data:   Vitals:   03/25/20 1600 03/25/20 1800 03/25/20 2052 03/26/20 0441  BP:  134/83 117/75 119/76  Pulse:  80 74 79  Resp:  18 18 18   Temp:  98.5 F (36.9 C) 98.5 F (36.9 C)  TempSrc:   Oral Oral  SpO2:  99% 97% 99%  Weight: (!) 145.2 kg     Height: 6\' 1"  (1.854 m)       Intake/Output Summary (Last 24 hours) at 03/26/2020 1305 Last data filed at 03/26/2020 0853 Gross per 24 hour  Intake 1583.11 ml  Output 1530 ml  Net 53.11 ml   Filed Weights   03/25/20 1600  Weight: (!) 145.2 kg   Body mass index is 42.23 kg/m.   General: Overweight, NAD Neck: Negative for carotid bruits. No JVD Lungs:Clear to ausculation bilaterally. No wheezes,  rales, or rhonchi. Breathing is unlabored. Cardiovascular: RRR with S1 S2. No murmurs Abdomen: Soft, non-tender, distended. No obvious abdominal masses. Extremities: No edema. Radial pulses 2+ bilaterally Neuro: Alert and oriented. No focal deficits. No facial asymmetry. MAE spontaneously. Psych: Responds to questions appropriately with normal affect.    Telemetry:  Telemetry was personally reviewed and demonstrates:  03/26/20 NSR with rates in the 70-80's  Relevant CV Studies:  Echocardiogram 02/23/2019:  1. Left ventricular ejection fraction, by visual estimation, is 35 to  40%. The left ventricle has moderately decreased function. Normal left  ventricular size. There is moderate left ventricular hypertrophy. There is  blobal left ventricular hypokinesis,  without regional variation.  2. Left ventricular diastolic Doppler parameters are consistent with  impaired relaxation pattern of LV diastolic filling.  3. Global right ventricle has mildly reduced systolic function.The right  ventricular size is not well visualized. Right vetricular wall thickness  was not assessed.  4. Left atrial size was mildly dilated.  5. Right atrial size was mildly dilated.  6. The aortic valve is normal in structure. Aortic valve regurgitation  was not visualized by color flow Doppler.  7. The aortic root was not well visualized.  8. TR signal is inadequate for assessing pulmonary artery systolic  pressure.  9. The inferior vena cava is normal in size with greater than 50%  respiratory variability, suggesting right atrial pressure of 3 mmHg.  10. The mitral valve is normal in structure. No evidence of mitral valve  regurgitation.  11. The tricuspid valve is normal in structure. Tricuspid valve  regurgitation was not visualized by color flow Doppler.  12. The pulmonic valve was not well visualized. Pulmonic valve  regurgitation is not visualized by color flow Doppler.   Echocardiogram  06/16/2015:  - Procedure narrative: Transthoracic echocardiography. Image  quality was fair. Intravenous contrast (Definity) was  administered.  - Left ventricle: The cavity size was normal. Wall thickness was  increased in a pattern of mild LVH. Systolic function was mildly  to moderately reduced. The estimated ejection fraction was in the  range of 40% to 45%. Diffuse hypokinesis. Doppler parameters are  consistent with abnormal left ventricular relaxation (grade 1  diastolic dysfunction). The E/e ratio is between 8-15, suggesting  indeterminate LV filling pressure.  - Left atrium: The atrium was normal in size.  - Inferior vena cava: The vessel was normal in size. The  respirophasic diameter changes were in the normal range (= 50%),  consistent with normal central venous pressure.   NST 11/28/2014:   The left ventricular ejection fraction is severely decreased (<30%).  Nuclear stress EF: 25%.   Diaphragmatic attenuation with mild LV dilatation and severeal global HK C/W NISCM  NST 01/31/2012:  IMPRESSION:  1. No reversible ischemia. Small mid anterior wall fixed  defect/infarct.  2. Ejection fraction 42%.  Laboratory Data:  Chemistry Recent Labs  Lab 03/25/20 0518 03/26/20 0209  NA 135 136  K 3.8 3.5  CL 98 100  CO2 26 25  GLUCOSE 233* 331*  BUN 22 28*  CREATININE 2.13* 2.11*  CALCIUM 8.1* 7.7*  GFRNONAA 34* 35*  ANIONGAP 11 11    Total Protein  Date Value Ref Range Status  01/11/2020 7.1 6.0 - 8.5 g/dL Final   Albumin  Date Value Ref Range Status  01/11/2020 3.7 (L) 3.8 - 4.8 g/dL Final   AST  Date Value Ref Range Status  01/11/2020 15 0 - 40 IU/L Final   ALT  Date Value Ref Range Status  01/11/2020 13 0 - 44 IU/L Final   Alkaline Phosphatase  Date Value Ref Range Status  01/11/2020 84 48 - 121 IU/L Final   Bilirubin Total  Date Value Ref Range Status  01/11/2020 0.2 0.0 - 1.2 mg/dL Final   Hematology Recent Labs   Lab 03/25/20 0518  WBC 11.4*  RBC 4.57  HGB 12.7*  HCT 39.1  MCV 85.6  MCH 27.8  MCHC 32.5  RDW 13.8  PLT 182   Cardiac EnzymesNo results for input(s): TROPONINI in the last 168 hours. No results for input(s): TROPIPOC in the last 168 hours.  BNPNo results for input(s): BNP, PROBNP in the last 168 hours.  DDimer No results for input(s): DDIMER in the last 168 hours. TSH:  Lab Results  Component Value Date   TSH 0.686 01/11/2020   Lipids: Lab Results  Component Value Date   CHOL 236 (H) 12/12/2019   HDL 28 (L) 12/12/2019   LDLCALC 174 (H) 12/12/2019   TRIG 182 (H) 12/12/2019   CHOLHDL 8.4 (H) 12/12/2019   HgbA1c: Lab Results  Component Value Date   HGBA1C 9.0 (H) 03/19/2020    Radiology/Studies:  No results found.  Assessment and Plan:   1.  Chronic combined systolic and diastolic CHF/nonischemic cardiomyopathy: -Patient initially underwent an NST in 2013 which showed a fixed anterior defect with an EF at 30 to 35%.  Subsequent echocardiogram performed 02/01/2012 showed an LVEF at 40 to 45% with diffuse hypokinesis and no regional wall motion abnormalities.  Repeat stress test 11/27/2014 with an EF noted to be at 25% with diaphragmatic attenuation with mild LV dilatation and severe global hypokinesis consistent with nonischemic cardiomyopathy.  Follow-up echocardiogram 06/16/2015 with LVEF of 40 to 45% with diffuse hypokinesis and G1 DD.  Most recent echocardiogram 02/2019 with LVEF at 35 to 40% with LVH, hypokinesis and no regional variation. -On day 2 post-op, creatinine continued to rise despite IVF to 2.1 felt initally to be secondary to poor oral intake with NPO status. [name] was held along with spironolactone and torsemide>>last dose of torsemide 03/25/20 at 0800 and [name] and [name] 03/24/20 at 3pm and 10pm. Xarelto now to be held until discharge out of concern for AKI.  -Weight, 320lb 03/25/20 -I&O, net positive 369ml>>>was 322lb at OP OV 01/11/20 -Does not appear  to be volume overloaded on exam. Would recommend continuing to hold Entresto and if plan is for discharge today>>would restart tomorrow at full dose. Hold spironolactone at least until follow up and can reassess then. Reduce torsemide to 20mg  QD until follow up. Plan for BMET at our office on Friday. Has follow up with myself 11/16 at 1115.   2.  Paroxysmal atrial fibrillation: -Initially dx during a hospitalization 05/2017 in the setting of AKI secondary to nephrolithiasis and urethral obstruction. He was treated with DCCV  cardioversion followed by recurrence of atrial flutter which was then treated with amiodarone and anticoagulation with Xarelto.  -Xarelto currently on hold secondary to rising renal function  -Continue Amiodarone, carvedilol   -Maintaining NSR with stable rates   3. Recent hernia repair: -Underwent laparoscopic hernia repair with mesh 03/24/20 and tolerate procedure well. Unfortunately the patient developed AKI and cardiology has been asked to assist with HF medications  -Pt may possibly be discharged later today per surgery   4. Acute on chronic CKD stage III: -Creatinine, 2.11 today  -Baseline appears to be in the  1.4-1.5 range -He has had issues with AKI in the past with periodic creatinine levels reaching 2.0 as recent as 06/2019.  -His Entresto, spiro and torsemide have been held  -Appear to be euvolemic on exam  -Would continue to hold Entresto until tomorrow then resume full dose. Reduce torsemide to 20mg  PO QD until follow up 04/01/20. Stop [name] for now  -BMET Friday   5. HTN:  -Stable, 146/88>119/76>117/75 -Continue carvedilol   6.  OSA: -Reports compliance with CPAP  7.  DM2: -Uncontrolled with a HbA1c at 9.0 -SSI per primary team   For questions or updates, please contact [place] Please consult [email or web address] for contact info under Cardiology/STEMI.   Lyndel Safe NP-C [facility] Pager: [phone] 03/26/2020 1:05 PM    Patient  seen and examined. Agree with assessment and plan.  Mr. Breydan Shillingburg is a 64 year old African-American male who is followed by Dr. Sallyanne Kuster.  He has a history of a nonischemic cardiomyopathy with a EF in the past 35 to 40%.  He has a history of PAF and has undergone prior cardioversion and as result has been maintained on amiodarone in addition to Xarelto anticoagulation.  He has had issues with renal insufficiency in the past with transient AKA.  He was recently admitted for elective incisional hernia with mesh and tolerated surgery successfully.  Prior to surgery his Xarelto has been held for 3 days and he was on Entresto 97/23 mg twice daily, torsemide 40 mg, and spironolactone 50 mg.  Postoperatively he developed renal insufficiency with creatinine increasing to 2.13 and today is 2.11.  Baseline creatinines more recently have been in the 1.4-1.5 range.  His last dose of Entresto and spironolactone was on November 8 and his last dose of torsemide was yesterday.  Presently he appears euvolemic.  He is not having chest pain or shortness of breath.  He is maintaining sinus rhythm.  His blood pressure is stable at 130/80.  There is no JVD.  Lungs are clear.  Rhythm is regular with no S3 gallop.  There is a 1/6 systolic murmur.  His abdomen is in a corset following his surgery pulses are 2+.  There is no ankle or pretibial edema.  Apparently, from a surgical standpoint the patient can be discharged today.  I have recommended that he can be discharged today and continue to hold Entresto, torsemide and spironolactone today.  Since he only has the 97/103 mg of Entresto he does not have any 45/51 dose for this medication, he will reinitiate this tomorrow but at present we will hold off the diuretic regimen. On Friday most likely torsemide at a reduced dose of 20 mg can be reinstituted.  I have recommended a follow-up bmet in obtained on Friday.  We are scheduling him to see Kathyrn Drown, NP who has seen him today  with me and she will evaluate him in the office setting on Tuesday and follow-up  blood work may be obtained.  If renal function continues to be improved spironolactone can be reinitiated but at a lower dose perhaps of just 12.5 mg.   Troy Sine, MD, [facility] 03/26/2020 3:03 PM

## 2020-03-26 NOTE — Progress Notes (Signed)
Cardiology Office Note   Date:  04/01/2020   ID:  Terry Parks, DOB January 22, 1956, MRN [id number]  PCP:  Terry Beard, MD  Cardiologist: Dr. Sallyanne Kuster, MD   Chief Complaint  Patient presents with  . Follow-up  . Hospitalization Follow-up    History of Present Illness: Terry Parks is a 64 y.o. male who presents for post hospital follow up, seen for Dr. Sallyanne Parks.   Terry Parks has a hx of chronic combined systolic and diastolic heart failure with an LVEF at 35 to 40% per echocardiogram 02/2019, paroxysmal atrial fibrillation, HTN, obesity, OSA on CPAP and DM2   Terry. Parks is a 64 year old male with a history as stated above who presented for laparoscopic hernia repair.   Terry. Parks is followed by Dr. Sallyanne Parks for his cardiology care.  He was initially diagnosed with CHF in 2013 after undergoing a stress test that showed a fixed nonreversible anterior defect and an ejection fraction at 42% with subsequent echocardiogram showing an LVEF of 40 to 45% with diffuse hypokinesis and no regional wall motion abnormalities.  He had a repeat stress test 11/2014 which showed an LVEF at 25% with diaphragmatic attenuation with mild LV dilatation and severe global hypokinesis consistent with nonischemic cardiomyopathy. He has had multiple echocardiogram follow-up studies with no real normalization of LV function.   He was hospitalized with acute CHF exacerbation 05/2017 in the setting of AKI secondary to nephrolithiasis and urethral obstruction. During that time, he had an episode of atrial fibrillation treated with DCCV cardioversion followed by recurrence of atrial flutter which was then treated with amiodarone and anticoagulation with Xarelto. He had an additional hospitalization 07/2019 for CHF felt to be secondary to sodium indiscretions.  He was diuresed approximately 7 L with IV Lasix with a discharge weight at 318lb.   He was seen by Dr. Sallyanne Parks 01/11/2020 in follow-up without  significant concern.  He was transitioned from losartan to [place] at that time. He was maintaining NSR. Cardiomyopathy felt to be secondary to poorly controlled BP however he has never formally undergone ischemic work-up therefore is presumed nonischemic cardiomyopathy.  Our team was contacted 02/06/2020 for preoperative cardiac clearance prior to laparoscopic incisional hernia repair with mesh. His Xarelto was held for 3 days prior to procedure and procedure was performed 03/24/20. He did well with surgery however renal function began to rise felt to be from NPO prior to surgery. He was given gentle IVF hydration with follow up labs.    On day 2 post-op, creatinine continued to rise despite IVF to 2.1. Terry Parks was held along with spironolactone and torsemide>>last dose of torsemide 03/25/20 at 0800 and Terry Parks and Terry Parks 03/24/20 at 3pm and 10pm. Xarelto now to be held until discharge out of concern for AKI.   Today he presents for hospital follow-up and reports he has not taken any medications since hospital discharge including amiodarone, Xarelto, or torsemide.  Long discussion regarding medication compliance.  He reports he was more focused on his postoperative period and "had no excuse for not taking his medications".  He reports having his medications at his home.  Does not appear overtly fluid volume overloaded on exam.  Has not seen surgery in outpatient follow-up however has had several RN phone calls.  Has close follow-up with Dr. Sallyanne Parks 04/23/2020.  Encouraged him to keep this appointment and restart medications today.   Past Medical History:  Diagnosis Date  . Abscessed tooth    top back large cavity no pain or  drainage, one on bottom  pt pulled tooth 4-5 months ago, right top large hole in tooth  . AKI (acute kidney injury) ([place])   . Allergic rhinitis   . Anemia   . Anxiety   . Asthma   . Atrial fibrillation ([place])   . BPH (benign prostatic hypertrophy)    Massive BPH noted on  cystoscopy 1/23/ 2012 by Dr. [name].  . Cancer [facility])    prostate cancer 2019  . Cardiomyopathy ([place])   . CHF (congestive heart failure) ([place])   . Cough 03/30/2012  . Depression   . Diabetes mellitus 04/08/2008   type 2  . Dyspnea   . Dysrhythmia 2019  . Foley catheter in place 07-05-17 placed  . Fracture, orbital ([place]) 2021   Right  . GERD (gastroesophageal reflux disease) 2020  . Headache(784.0)    hx migraines none recent  . Hyperlipemia   . Hypertension   . Hypertensive cardiopathy 03/01/2006   2-D echocardiogram 02/01/2012 showed moderate LVH, mildly to moderately reduced left ventricular systolic function with an estimated ejection fraction of 40-45%, and diffuse hypokinesis.  A nuclear medicine stress study done 01/31/2012 showed no reversible ischemia, a small mid anterior wall fixed defect/infarct, and ejection fraction 42%.      . Neck pain   . Nephrolithiasis 05/29/2010   CT scan of abdomen/pelvis on 05/29/2010 showed an obstructing approximate 1-2 mm calculus at the left UVJ, and an approximate 1-2 mm left lower pole renal calculus.   Patient had continuing severe pain , and an elevation of his serum creatinine to a value of 1.75 on 06/06/2010.  Patient underwent cystoscopy on 06/08/2010 by Dr. [name], but attempts at retrograde pyelogram and ureteroscopy were unsucc  . Numbness 01/08/2018  . Obstructive sleep apnea 03/06/2008   Sleep study 03/06/08 showed severe OSA/hypopnea syndrome, with successful CPAP titration to 13 CWP using a medium ResMed Mirage Quattro full face mask with heated humidifier.   . Rash 04/17/2014  . Renal calculus 05/29/2010   CT scan of abdomen/pelvis on 05/29/2010 showed an obstructing approximate 1-2 mm calculus at the left UVJ, and an approximate 1-2 mm left lower pole renal calculus.   Patient had continuing severe pain , and an elevation of his serum creatinine to a value of 1.75 on 06/06/2010.  The stone had apparently passed and was not seen on repeat CT  06/08/2010.  . Sleep apnea    haven't use cpap in 2 years  . Tooth pain 10/29/2017  . Urinary straining 11/02/2016    Past Surgical History:  Procedure Laterality Date  . big toe nails removed Bilateral 20 yrs ago  . CARDIOVERSION N/A 06/08/2017   Procedure: CARDIOVERSION;  Surgeon: Josue Hector, MD;  Location: [facility] ENDOSCOPY;  Service: Cardiovascular;  Laterality: N/A;  . COLONOSCOPY    . CYSTOSCOPY W/ RETROGRADES    . FRACTURE SURGERY Right 2021   5th right phalange  . INCISIONAL HERNIA REPAIR N/A 03/24/2020   Procedure: LAPAROSCOPIC INCISIONAL HERNIA REPAR WITH MESH;  Surgeon: Georganna Skeans, MD;  Location: [facility];  Service: General;  Laterality: N/A;  . IR RADIOLOGIST EVAL & MGMT  02/02/2018  . LYMPHADENECTOMY Bilateral 10/06/2017   Procedure: LYMPHADENECTOMY;  Surgeon: Lucas Mallow, MD;  Location: [facility] ORS;  Service: Urology;  Laterality: Bilateral;  . ROBOT ASSISTED LAPAROSCOPIC RADICAL PROSTATECTOMY N/A 10/06/2017   Procedure: XI ROBOTIC ASSISTED LAPAROSCOPIC RADICAL PROSTATECTOMY;  Surgeon: Lucas Mallow, MD;  Location: [facility] ORS;  Service: Urology;  Laterality: N/A;  .  TRANSURETHRAL RESECTION OF BLADDER TUMOR N/A 07/13/2017   Procedure: TRANSURETHRAL RESECTION OF PROSTATE;  Surgeon: Lucas Mallow, MD;  Location: [facility] ORS;  Service: Urology;  Laterality: N/A;     Current Outpatient Medications  Medication Sig Dispense Refill  . Accu-Chek Softclix Lancets lancets Check blood sugar three times a day as instructed 300 each 3  . acetaminophen (TYLENOL) 325 MG tablet Take 650 mg by mouth every 6 (six) hours as needed for moderate pain.     [name] amiodarone (PACERONE) 200 MG tablet TAKE 1 TABLET BY MOUTH DAILY. 30 tablet 1  . atorvastatin (LIPITOR) 40 MG tablet Take 1 tablet (40 mg total) by mouth daily. 90 tablet 3  . Blood Glucose Monitoring Suppl (ACCU-CHEK AVIVA) device Use as instructed to check blood sugar up to 3 times a day 1 each 0  . carvedilol (COREG) 25 MG tablet Take 1  tablet (25 mg total) by mouth 2 (two) times daily with a meal. 180 tablet 1  . docusate sodium (COLACE) 100 MG capsule Take 1 capsule (100 mg total) by mouth 2 (two) times daily. 60 capsule 2  . empagliflozin (JARDIANCE) 10 MG TABS tablet Take 1 tablet (10 mg total) by mouth daily before breakfast. 30 tablet 2  . 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 insulin dependent 300 each 11  . ibuprofen (ADVIL) 200 MG tablet Take 800 mg by mouth every 8 (eight) hours as needed for moderate pain.    . Insulin Degludec-Liraglutide (XULTOPHY) 100-3.6 UNIT-MG/ML SOPN Inject 50 Units into the skin daily. 5 pen 11  . Insulin Pen Needle (CARETOUCH PEN NEEDLES) 31G X 6 MM MISC 1 pen by Does not apply route at bedtime. 100 each 3  . metFORMIN (GLUCOPHAGE XR) 500 MG 24 hr tablet Take 4 tablets (2,000 mg total) by mouth daily with breakfast. 360 tablet 3  . nystatin (NYSTATIN) powder Apply 1 application topically 3 (three) times daily. 60 g 1  . omeprazole (PRILOSEC) 40 MG capsule Take 1 capsule (40 mg total) by mouth 2 (two) times daily before a meal. 60 capsule 3  . oxyCODONE (ROXICODONE) 5 MG immediate release tablet Take 1 tablet (5 mg total) by mouth every 6 (six) hours as needed. Alternate each dose with tylenol. 20 tablet 0  . rivaroxaban (XARELTO) 20 MG TABS tablet Take 1 tablet (20 mg total) by mouth daily with breakfast. 90 tablet 0  . sacubitril-valsartan (ENTRESTO) 97-103 MG Take 1 tablet by mouth 2 (two) times daily. 180 tablet 1  . torsemide (DEMADEX) 20 MG tablet Take 1 tablet (20 mg total) by mouth daily. 14 tablet 0   No current facility-administered medications for this visit.    Allergies:   Lisinopril    Social History:  The patient  reports that he has never smoked. He has never used smokeless tobacco. He reports that he does not drink alcohol and does not use drugs.   Family History:  The patient's family history includes Breast  cancer in his mother; Diabetes in his maternal grandmother; Hypertension in his father.    ROS:  Please see the history of present illness. Otherwise, review of systems are positive for none.   All other systems are reviewed and negative.    PHYSICAL EXAM: VS:  BP (!) 140/94   Pulse 90   Ht '6\' 1"'  (1.854 m)   Wt (!) 317 lb 12.8 oz (144.2 kg)   SpO2 91%  BMI 41.93 kg/m  , BMI Body mass index is 41.93 kg/m.   General: Well developed, well nourished, NAD Lungs:Clear to ausculation bilaterally. No wheezes, rales, or rhonchi. Breathing is unlabored. Cardiovascular: RRR with S1 S2. No murmurs Extremities: Mild BLE edema.  Radial pulses 2+ bilaterally Neuro: Alert and oriented. No focal deficits. No facial asymmetry. MAE spontaneously. Psych: Responds to questions appropriately with normal affect.     EKG:  EKG is not ordered today.  Recent Labs: 08/10/2019: B Natriuretic Peptide 220.7 08/13/2019: Magnesium 2.3 01/11/2020: ALT 13; TSH 0.686 03/25/2020: Hemoglobin 12.7; Platelets 182 03/26/2020: BUN 28; Creatinine, Ser 2.11; Potassium 3.5; Sodium 136    Lipid Panel    Component Value Date/Time   CHOL 236 (H) 12/12/2019 1716   TRIG 182 (H) 12/12/2019 1716   HDL 28 (L) 12/12/2019 1716   CHOLHDL 8.4 (H) 12/12/2019 1716   CHOLHDL 3.9 01/09/2018 0509   VLDL 11 01/09/2018 0509   LDLCALC 174 (H) 12/12/2019 1716      Wt Readings from Last 3 Encounters:  04/01/20 (!) 317 lb 12.8 oz (144.2 kg)  03/25/20 (!) 320 lb 1.7 oz (145.2 kg)  03/19/20 (!) 320 lb 1.6 oz (145.2 kg)    Other studies Reviewed: Additional studies/ records that were reviewed today include:  Review of the above records demonstrates:   Echocardiogram 02/23/2019:  1. Left ventricular ejection fraction, by visual estimation, is 35 to  40%. The left ventricle has moderately decreased function. Normal left  ventricular size. There is moderate left ventricular hypertrophy. There is  blobal left ventricular  hypokinesis,  without regional variation.  2. Left ventricular diastolic Doppler parameters are consistent with  impaired relaxation pattern of LV diastolic filling.  3. Global right ventricle has mildly reduced systolic function.The right  ventricular size is not well visualized. Right vetricular wall thickness  was not assessed.  4. Left atrial size was mildly dilated.  5. Right atrial size was mildly dilated.  6. The aortic valve is normal in structure. Aortic valve regurgitation  was not visualized by color flow Doppler.  7. The aortic root was not well visualized.  8. TR signal is inadequate for assessing pulmonary artery systolic  pressure.  9. The inferior vena cava is normal in size with greater than 50%  respiratory variability, suggesting right atrial pressure of 3 mmHg.  10. The mitral valve is normal in structure. No evidence of mitral valve  regurgitation.  11. The tricuspid valve is normal in structure. Tricuspid valve  regurgitation was not visualized by color flow Doppler.  12. The pulmonic valve was not well visualized. Pulmonic valve  regurgitation is not visualized by color flow Doppler.   Echocardiogram 06/16/2015:  - Procedure narrative: Transthoracic echocardiography. Image  quality was fair. Intravenous contrast (Definity) was  administered.  - Left ventricle: The cavity size was normal. Wall thickness was  increased in a pattern of mild LVH. Systolic function was mildly  to moderately reduced. The estimated ejection fraction was in the  range of 40% to 45%. Diffuse hypokinesis. Doppler parameters are  consistent with abnormal left ventricular relaxation (grade 1  diastolic dysfunction). The E/e ratio is between 8-15, suggesting  indeterminate LV filling pressure.  - Left atrium: The atrium was normal in size.  - Inferior vena cava: The vessel was normal in size. The  respirophasic diameter changes were in the normal range (=  50%),  consistent with normal central venous pressure.   NST 11/28/2014:   The left ventricular ejection fraction  is severely decreased (<30%).  Nuclear stress EF: 25%.  Diaphragmatic attenuation with mild LV dilatation and severeal global HK C/W NISCM  NST 01/31/2012:  IMPRESSION:  1. No reversible ischemia. Small mid anterior wall fixed  defect/infarct.  2. Ejection fraction 42%.    ASSESSMENT AND PLAN:  1.  Chronic combined systolic and diastolic CHF/nonischemic cardiomyopathy: -Denies anginal symptoms, no overt fluid volume overloaded on exam despite not being on has medications -Encouraged to restart GDMT with close follow-up with Dr. Sallyanne Parks 04/23/2020 -Meds held during hospital stay due to AKI which plan was to restart Entresto on day after discharge and to hold spironolactone normalized until follow-up -Check be met today and encouraged to restart all medications  2.  Paroxysmal atrial fibrillation: -Initially dx during a hospitalization 05/2017 in the setting of AKI secondary to nephrolithiasis and urethral obstruction. He was treated with DCCV cardioversion followed by recurrence of atrial flutter which was then treated with amiodarone and anticoagulation with Xarelto.  -Patient has not taken Xarelto since hospital discharge >>> encouraged to restart>> restart amiodarone, beta-blocker -NSR per auscultation   3. Recent hernia repair: -Underwent laparoscopic hernia repair with mesh 03/24/20 and tolerate procedure well.  -Unfortunately the patient developed AKI therefore Entresto and spironolactone were held with plans to restart 1 day after hospital discharge however patient has not restarted any of his cardiac medications  -Obtain lab work today with plan as above   4. Acute on chronic CKD stage III: -Peak creatinine, 2.11 with a baseline that appears to be in the  1.4-1.5 range -BMET today>> was to get lab work last Friday however this was not  5. HTN:   -Stable, 140/94 -Continue carvedilol   Current medicines are reviewed at length with the patient today.  The patient does not have concerns regarding medicines.  The following changes have been made:  no change  Labs/ tests ordered today include: BMET, restart medications  Orders Placed This Encounter  Procedures  . Basic metabolic panel    Disposition:   FU with Dr. Sallyanne Parks on 04/23/2020    Signed, Kathyrn Drown, NP  04/01/2020 12:03 PM    [address], [place], [place]  [address] Phone: [phone]; Fax: [phone]

## 2020-03-27 LAB — GLUCOSE, CAPILLARY: Glucose-Capillary: 176 mg/dL — ABNORMAL HIGH (ref 70–99)

## 2020-03-27 NOTE — Discharge Summary (Signed)
Patient ID: Dru Primeau [id number] [birth year] 64 y.o.  Admit date: 03/24/2020 Discharge date: 03/27/2020  Admitting Diagnosis: Incisional hernia  Discharge Diagnosis Patient Active Problem List   Diagnosis Date Noted  . S/P laparoscopic hernia repair 03/24/2020  . History of iron deficiency anemia 12/16/2019  . Anemia 12/16/2019  . History of esophagitis 12/16/2019  . Ventral hernia without obstruction or gangrene 12/16/2019  . Acute on chronic HFrEF (heart failure with reduced ejection fraction) ([place]) 08/11/2019  . 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  . Depression 01/31/2019  . Right hip pain 09/19/2018  . Iron deficiency anemia 04/28/2018  . Diastasis recti 04/28/2018  . Abdominal hernia 03/10/2018  . Stage 3 chronic kidney disease 02/11/2018  . Healthcare maintenance 02/11/2018  . Physical deconditioning 02/11/2018  . Stress incontinence of urine 01/06/2018  . Pain due to dental caries 11/09/2017  . Prostate cancer ([place]) 10/06/2017  . Bladder mass 07/13/2017  . Persistent atrial fibrillation   . Vitamin D deficiency 06/22/2016  . Neuropathy in diabetes ([place]) 10/07/2015  . Erectile dysfunction 04/17/2014  . Allergic rhinitis 10/19/2012  . Diabetic retinopathy ([place]) 10/04/2012  . BPH   . Type 2 diabetes mellitus with peripheral neuropathy ([place]) 04/08/2008  . Obstructive sleep apnea   . Morbid obesity ([place])   . Hyperlipidemia associated with type 2 diabetes mellitus ([place])   . Hypertension associated with diabetes ([place])   . Chronic combined systolic and diastolic CHF (congestive heart failure) ([place]) 03/01/2006    Consultants Cardiology  Reason for Admission: Incisional hernia repair  Procedures Lap incisional hernia repair  Hospital Course:  46M s/p lap incisional hernia repair with mesh implantation. Multiple medical problems at baseline and admitted post-op. Acute kidney injury post-operatively, cardiology consult with adjustment of home meds and  plan for short-interval follow-up and repeat labwork. Otherwise expected post-operative course and stable for discharge 03/26/2020.  Physical Exam: General appearance: cooperative Resp: clear to auscultation bilaterally Cardio: irregularly irregular rhythm GI: soft, incisions CDI, binder reapplied  Allergies as of 03/26/2020      Reactions   Lisinopril Cough      Medication List    STOP taking these medications   spironolactone 50 MG tablet Commonly known as: ALDACTONE     TAKE these medications   Accu-Chek Aviva device Use as instructed to check blood sugar up to 3 times a day   Accu-Chek Softclix Lancets lancets Check blood sugar three times a day as instructed   acetaminophen 325 MG tablet Commonly known as: TYLENOL Take 650 mg by mouth every 6 (six) hours as needed for moderate pain.   amiodarone 200 MG tablet Commonly known as: PACERONE TAKE 1 TABLET BY MOUTH DAILY.   atorvastatin 40 MG tablet Commonly known as: LIPITOR Take 1 tablet (40 mg total) by mouth daily.   carvedilol 25 MG tablet Commonly known as: COREG Take 1 tablet (25 mg total) by mouth 2 (two) times daily with a meal.   docusate sodium 100 MG capsule Commonly known as: Colace Take 1 capsule (100 mg total) by mouth 2 (two) times daily.   empagliflozin 10 MG Tabs tablet Commonly known as: Jardiance Take 1 tablet (10 mg total) by mouth daily before breakfast.   Entresto 97-103 MG Generic drug: sacubitril-valsartan Take 1 tablet by mouth 2 (two) times daily.   glucose blood test strip Commonly known as: Accu-Chek Aviva Use to check blood sugar 3 times daily diag code E11.42. insulin dependent   ibuprofen 200 MG tablet Commonly known  as: ADVIL Take 800 mg by mouth every 8 (eight) hours as needed for moderate pain.   Insulin Pen Needle 31G X 6 MM Misc Commonly known as: CareTouch Pen Needles 1 pen by Does not apply route at bedtime.   metFORMIN 500 MG 24 hr tablet Commonly known as:  Glucophage XR Take 4 tablets (2,000 mg total) by mouth daily with breakfast. What changed: when to take this   nystatin powder Commonly known as: nystatin Apply 1 application topically 3 (three) times daily. What changed:   when to take this  reasons to take this   omeprazole 40 MG capsule Commonly known as: PRILOSEC Take 1 capsule (40 mg total) by mouth 2 (two) times daily before a meal.   oxyCODONE 5 MG immediate release tablet Commonly known as: Roxicodone Take 1 tablet (5 mg total) by mouth every 6 (six) hours as needed. Alternate each dose with tylenol.   rivaroxaban 20 MG Tabs tablet Commonly known as: XARELTO Take 1 tablet (20 mg total) by mouth daily with breakfast.   torsemide 20 MG tablet Commonly known as: DEMADEX Take 1 tablet (20 mg total) by mouth daily. What changed: how much to take   Xultophy 100-3.6 UNIT-MG/ML Sopn Generic drug: Insulin Degludec-Liraglutide Inject 50 Units into the skin daily. What changed:   how much to take  when to take this         Follow-up Information    Tommie Raymond, NP Follow up on 04/01/2020.   Specialty: Cardiology Why: at 1115am. Please arrive for appointment 15 minutes prior  Contact information: [address] [place] [address] [phone]                Signed: Jesusita Oka, [facility] 03/27/2020, 8:32 AM

## 2020-03-28 ENCOUNTER — Ambulatory Visit: Payer: Medicaid Other | Admitting: *Deleted

## 2020-03-28 DIAGNOSIS — I5042 Chronic combined systolic (congestive) and diastolic (congestive) heart failure: Secondary | ICD-10-CM

## 2020-03-28 DIAGNOSIS — N183 Chronic kidney disease, stage 3 unspecified: Secondary | ICD-10-CM

## 2020-03-28 DIAGNOSIS — I4819 Other persistent atrial fibrillation: Secondary | ICD-10-CM

## 2020-03-28 DIAGNOSIS — E1142 Type 2 diabetes mellitus with diabetic polyneuropathy: Secondary | ICD-10-CM

## 2020-03-28 NOTE — Patient Instructions (Addendum)
Visit Information It was nice speaking with you today.  Goals Addressed              This Visit's Progress   .  "Can I remove my band" (pt-stated)        . Patient Goals/Self Care Activities related to Inguinal Hernia Repair .  Patient verbalizes understanding of plan .  Self-administers medications as prescribed . Calls pharmacy for medication refills . Call's provider office for new concerns or questions . Call Surgeons' office concerning question regarding his belly band.         The patient verbalized understanding of instructions, educational materials, and care plan provided today and declined offer to receive copy of patient instructions, educational materials, and care plan.   The care management team will reach out to the patient again over the next 14 days.   Lazaro Arms RN, BSN, [facility] Care Management Coordinator [place] Phone: [phone] Fax: [phone]

## 2020-03-28 NOTE — Chronic Care Management (AMB) (Signed)
  Chronic Care Management   Note  03/28/2020 Name: Terry Parks MRN: [id number] DOB: Sep 18, 1955  Patient Care Plan: RN Care Manager  Problem Identified: Inquinal Hernia Repair TOC.   Note:   . Chronic Disease Management support, education, and care coordination needs related to IP event on 11//8/21-11/9/21 for Inguinal Hernia Repair  . Clinical Goal(s) related to Inguinal Hernia Repair   Over the next 14 days, patient will:  . Work with the care management team to address educational, disease management, and care coordination needs  . Call provider office for new or worsened signs and symptoms  . Call care management team with questions or concerns . Verbalize basic understanding of patient centered plan of care established today  Interventions related to Inguinal Hernia Repair:  . Evaluation of current treatment plans and patient's adherence to plan as established by provider . Assessed patient understanding of disease states- patient asked about the band that he is wearing if he can take it off.  I advised him to call the sugery center for them to advise him what would be appropriate. . Assessed patient's education and care coordination needs . Provided disease specific education to patient  . Collaborated with appropriate clinical care team members regarding patient needs . Medications reviewed-patient states that he did not pick up the pain medication because he does not need it.  He is not in pain he is just sore.  He did not get the colace or torsemide because he has some at home. He states that he has had a bowel movement since being at home and feeling well.  He is staying hydrated and eating.  He is not eating as much as he usually does because he doesn't have that much of an appetite.  Dicussed Diet and the protein in his diet for the healing process. He verbalized understanding. . Scheduled Appointments Reviewed: Patient know that he has an appointment with Kathyrn Drown NP  04/01/20 at 59 am [address] [place] and Dr Sallyanne Kuster. 04/23/20 925 am [place].  He states that he has transportation to the appointments.  . Patient Goals/Self Care Activities related to Inguinal Hernia Repair .  Patient verbalizes understanding of plan .  Self-administers medications as prescribed . Calls pharmacy for medication refills . Call's provider office for new concerns or questions . Call Surgeons' office concerning question regarding his belly band.  Follow up Plan: The care management team will reach out to the patient again over the next 14 days.       Follow up plan: The care management team will reach out to the patient again over the next 14 days.   Lazaro Arms RN, BSN, [facility] Care Management Coordinator [place] Phone: [phone] Fax: [phone]

## 2020-03-28 NOTE — Progress Notes (Signed)
Internal Medicine Clinic Resident  I have personally reviewed this encounter including the documentation in this note and/or discussed this patient with the care management provider. I will address any urgent items identified by the care management provider and will communicate my actions to the patient's PCP. I have reviewed the patient's CCM visit with my supervising attending, Dr Guilloud.  Taige Housman Y Tal Kempker, MD 03/28/2020   

## 2020-03-31 NOTE — Progress Notes (Signed)
Internal Medicine Clinic Attending  CCM services provided by the care management provider and their documentation were discussed with Dr. Bridgett Larsson. We reviewed the pertinent findings, urgent action items addressed by the resident and non-urgent items to be addressed by the PCP.  I agree with the assessment, diagnosis, and plan of care documented in the CCM and resident's note.  Velna Ochs, MD 03/31/2020

## 2020-04-01 ENCOUNTER — Encounter: Payer: Self-pay | Admitting: Cardiology

## 2020-04-01 ENCOUNTER — Other Ambulatory Visit: Payer: Self-pay

## 2020-04-01 ENCOUNTER — Ambulatory Visit (INDEPENDENT_AMBULATORY_CARE_PROVIDER_SITE_OTHER): Payer: Medicaid Other | Admitting: Cardiology

## 2020-04-01 VITALS — BP 140/94 | HR 90 | Ht 73.0 in | Wt 317.8 lb

## 2020-04-01 DIAGNOSIS — I5042 Chronic combined systolic (congestive) and diastolic (congestive) heart failure: Secondary | ICD-10-CM

## 2020-04-01 DIAGNOSIS — N183 Chronic kidney disease, stage 3 unspecified: Secondary | ICD-10-CM | POA: Diagnosis not present

## 2020-04-01 DIAGNOSIS — I480 Paroxysmal atrial fibrillation: Secondary | ICD-10-CM

## 2020-04-01 DIAGNOSIS — E785 Hyperlipidemia, unspecified: Secondary | ICD-10-CM | POA: Diagnosis not present

## 2020-04-01 DIAGNOSIS — I5023 Acute on chronic systolic (congestive) heart failure: Secondary | ICD-10-CM

## 2020-04-01 DIAGNOSIS — Z7901 Long term (current) use of anticoagulants: Secondary | ICD-10-CM

## 2020-04-01 DIAGNOSIS — E1142 Type 2 diabetes mellitus with diabetic polyneuropathy: Secondary | ICD-10-CM

## 2020-04-01 LAB — BASIC METABOLIC PANEL
BUN/Creatinine Ratio: 7 — ABNORMAL LOW (ref 10–24)
BUN: 9 mg/dL (ref 8–27)
CO2: 26 mmol/L (ref 20–29)
Calcium: 8.7 mg/dL (ref 8.6–10.2)
Chloride: 103 mmol/L (ref 96–106)
Creatinine, Ser: 1.32 mg/dL — ABNORMAL HIGH (ref 0.76–1.27)
GFR calc Af Amer: 66 mL/min/{1.73_m2} (ref >59)
GFR calc non Af Amer: 57 mL/min/{1.73_m2} — ABNORMAL LOW (ref >59)
Glucose: 113 mg/dL — ABNORMAL HIGH (ref 65–99)
Potassium: 4 mmol/L (ref 3.5–5.2)
Sodium: 139 mmol/L (ref 134–144)

## 2020-04-01 NOTE — Patient Instructions (Signed)
Medication Instructions:  Your physician recommends that you RESTART and continue on your current medications as directed. Please refer to the Current Medication list given to you today.  *If you need a refill on your cardiac medications before your next appointment, please call your pharmacy*   Lab Work: TODAY: BMET If you have labs (blood work) drawn today and your tests are completely normal, you will receive your results only by: [name] MyChart Message (if you have MyChart) OR . A paper copy in the mail If you have any lab test that is abnormal or we need to change your treatment, we will call you to review the results.   Testing/Procedures: NONE   Follow-Up: At [facility], you and your health needs are our priority.  As part of our continuing mission to provide you with exceptional heart care, we have created designated Provider Care Teams.  These Care Teams include your primary Cardiologist (physician) and Advanced Practice Providers (APPs -  Physician Assistants and Nurse Practitioners) who all work together to provide you with the care you need, when you need it.  We recommend signing up for the patient portal called "MyChart".  Sign up information is provided on this After Visit Summary.  MyChart is used to connect with patients for Virtual Visits (Telemedicine).  Patients are able to view lab/test results, encounter notes, upcoming appointments, etc.  Non-urgent messages can be sent to your provider as well.   To learn more about what you can do with MyChart, go to [email or web address].    Your physician recommends that you keep your scheduled  follow-up appointment with  [name], MD

## 2020-04-02 ENCOUNTER — Ambulatory Visit: Payer: Medicaid Other | Admitting: *Deleted

## 2020-04-02 DIAGNOSIS — E1142 Type 2 diabetes mellitus with diabetic polyneuropathy: Secondary | ICD-10-CM

## 2020-04-02 DIAGNOSIS — I5042 Chronic combined systolic (congestive) and diastolic (congestive) heart failure: Secondary | ICD-10-CM

## 2020-04-02 DIAGNOSIS — E1159 Type 2 diabetes mellitus with other circulatory complications: Secondary | ICD-10-CM

## 2020-04-02 DIAGNOSIS — N183 Chronic kidney disease, stage 3 unspecified: Secondary | ICD-10-CM

## 2020-04-02 DIAGNOSIS — I4819 Other persistent atrial fibrillation: Secondary | ICD-10-CM

## 2020-04-02 DIAGNOSIS — I152 Hypertension secondary to endocrine disorders: Secondary | ICD-10-CM

## 2020-04-02 NOTE — Chronic Care Management (AMB) (Signed)
Care Management   Follow Up Note   04/02/2020 Name: Terry Parks MRN: [id number] DOB: [birth year]  Terry Parks is enrolled in a Managed Medicaid plan: Yes. Outreach attempt today was successful.    Referred by: [name], MD Reason for referral : Care Coordination ( IDDM, HF, HTN, PAF, CKD)   Terry Parks is a 64 y.o. year old male who is a primary care patient of [name], MD. The care management team was consulted for assistance with care management and care coordination needs.    Review of patient status, including review of consultants reports, relevant laboratory and other test results, and collaboration with appropriate care team members and the patient's provider was performed as part of comprehensive patient evaluation and provision of chronic care management services.    Goals Addressed              This Visit's Progress     Patient Stated     "[name] told me I have heart failure and he explained it to me real well." (pt-stated)        CARE PLAN ENTRY (see longitudinal plan of care for additional care plan information)  Current Barriers:   Knowledge deficit related to basic heart failure pathophysiology and self care management- patient states Dr Sallyanne Kuster explained heart failure to him during his office visit on 8/27 and that he prescribed atorvastatin and Entresto and stopped Imdur and he understands the importance of daily weights and was able to correctly identify the weight gain parameters that would require MD notification, he also says Dr Sallyanne Kuster wants him to meet with a pharmacist in his office to review his medication list 04/02/20- patient states he received a good check up at the cardiologist office yesterday but also says he was told he must resume his cardiac medications ASAP and that he has done so, he also says the office called him today to tell him his creatinine has returned to baseline  Nurse Case Manager Clinical Goal(s):    Over the next 30-90 days, patient will weigh self daily and record  Over the next 30-90  days, patient will verbalize understanding of Heart Failure Action Plan and when to call doctor  Over the next 30-90 days, patient will take all Heart Failure mediations as prescribed  Interventions:   Basic overview and discussion of pathophysiology of Heart Failure  Reviewed cardiology office  note of 04/01/20 with patient  Reiterated importance of good medication behavior and ensured patient is now taking all prescribed medications  Reviewed Heart Failure Action Plan   Reviewed importance of daily weights  Reviewed upcoming appointment with cardiologist on 04/23/20 and ensured patient has transportation  Patient Self Care Activities:   Take Heart Failure Medications as prescribed  Weigh daily and record (notify MD with 3 lb weight gain over night or 5 lb in a week)  Follow CHF Action Plan  Adhere to low sodium diet  Please see past updates related to this goal by clicking on the "Past Updates" button in the selected goal        COMPLETED: "I want to get my hernia fixed but I need a referral to a surgeon." (pt-stated)        CARE PLAN ENTRY (see longitudinal plan of care for additional care plan information)  Current Barriers:   Knowledge Deficits related to securing referral to general surgeon - spoke with patient via phone to follow up on recovery from hernia repair surgery on 03/24/20- patient states he  is doing well, says blood pressure and blood sugars are meeting treatment targets, no issues with passing urine and bowel movements, voices understanding to call surgeon's office to schedule follow up appointment 2-3 weeks post surgery.  Nurse Case Manager Clinical Goal(s):   Over the next 30-60 days, patient will work with CCM RN and  provider to address needs related to general surgeon consult for hernia repair  Interventions:   Inter-disciplinary care team collaboration  (see longitudinal plan of care)  Assessed recovery from hernia surgery  Ensured patient has phone number for surgeon's office and instructed him per hospital discharge instructions, he is to call to arrange follow up with surgeon 2-3 weeks post surgery  Patient Self Care Activities:   Patient verbalizes understanding of plan to work with health care team to secure general surgeon consult.   Unable to independently secure referral to general surgeon  Please see past updates related to this goal by clicking on the "Past Updates" button in the selected goal        "My blood sugar numbers look good now." (pt-stated)         [place] (see longitudinal plan of care for additional care plan information)  Objective:  Lab Results  Component Value Date   HGBA1C 9.0 (H) 03/19/2020   HGBA1C 10.1 (A) 12/12/2019   HGBA1C 12.7 (A) 07/12/2019   Lab Results  Component Value Date   MICROALBUR 11.3 (H) 04/17/2014   LDLCALC 174 (H) 12/12/2019   CREATININE 1.30 (H) 03/19/2020     Current Barriers:   Knowledge Deficits related to basic Diabetes pathophysiology and self care/management  Knowledge Deficits related to medications used for management of diabetes- spoke with patient to complete follow up assessment, he reports blood sugars are meeting targets and he reports good medication taking behavior  Case Manager Clinical Goal(s):  Over the next 30-60  days, patient will demonstrate improved adherence to prescribed treatment plan for diabetes self care/management as evidenced by:   daily monitoring and recording of CBG   adherence to ADA/ carb modified diet  adherence to prescribed medication regimen  Interventions:   Provided education to patient about basic DM disease process  Reviewed medications and assessed medication taking behavior  Advised patient, providing education and rationale, to check CBG at least daily and record, calling provider and/or CCM RN for findings  outside established parameters.  Discussed plans with patient for ongoing care management follow up and provided patient with direct contact information for care management team  Review of patient status, including review of consultants reports, relevant laboratory and other test results, and medications completed.   Patient Self Care Activities:   UNABLE to independently self manage DM  Self administers oral medications as prescribed  Self administers insulin as prescribed  Self administers injectable DM medication Xultophy as prescribed  Attends all scheduled provider appointments  Checks blood sugars as prescribed and utilize hyper and hypoglycemia protocol as needed  Adheres to prescribed ADA/carb modified  Please see past updates related to this goal by clicking on the "Past Updates" button in the selected goal            Other     Blood Pressure < 130/80           BP Readings from Last 3 Encounters:  04/01/20 (!) 140/94  03/26/20 (!) 146/88  03/19/20 (!) 163/96        Reviewed HTN self management strategies:  Take medications as prescribed  Monitor your  blood pressure at home- patient states he is currently using a wrist monitor that a friend loaned him as the cuff on his arm machine is too small and the machine is too awkward to use  Follow a low salt diet- limit consumption of fast foods, packaged foods  Get regular exercise  Maintain a healthy weight  Manage stress        Decrease soda intake        HEMOGLOBIN A1C < 7        Lab Results  Component Value Date   HGBA1C 9.0 (H) 03/19/2020   HGBA1C 10.1 (A) 12/12/2019   HGBA1C 12.7 (A) 07/12/2019   Lab Results  Component Value Date   MICROALBUR 11.3 (H) 04/17/2014   LDLCALC 174 (H) 12/12/2019   CREATININE 1.32 (H) 04/01/2020     Not meeting Hgb A1C treatment targets          The care management team will reach out to the patient again over the next 30-60 days.   [name] RN,  CCM, [facility] RN Care Manager [phone]

## 2020-04-02 NOTE — Patient Instructions (Signed)
Visit Information It was nice speaking with you today. It's good to hear you are recovering well from hernia surgery. Goals Addressed              This Visit's Progress     Patient Stated   .  "[name] told me I have heart failure and he explained it to me real well." (pt-stated)        CARE PLAN ENTRY (see longitudinal plan of care for additional care plan information)  Current Barriers:  [name] Knowledge deficit related to basic heart failure pathophysiology and self care management- patient states Dr Sallyanne Kuster explained heart failure to him during his office visit on 8/27 and that he prescribed atorvastatin and Entresto and stopped Imdur and he understands the importance of daily weights and was able to correctly identify the weight gain parameters that would require MD notification, he also says Dr Sallyanne Kuster wants him to meet with a pharmacist in his office to review his medication list 04/02/20- patient states he received a good check up at the cardiologist office yesterday but also says he was told he must resume his cardiac medications ASAP and that he has done so, he also says the office called him today to tell him his creatinine has returned to baseline  Nurse Case Manager Clinical Goal(s):   Over the next 30-90 days, patient will weigh self daily and record  Over the next 30-90  days, patient will verbalize understanding of Heart Failure Action Plan and when to call doctor  Over the next 30-90 days, patient will take all Heart Failure mediations as prescribed  Interventions:  . Basic overview and discussion of pathophysiology of Heart Failure . Reviewed cardiology office  note of 04/01/20 with patient . Reiterated importance of good medication behavior and ensured patient is now taking all prescribed medications . Reviewed Heart Failure Action Plan  . Reviewed importance of daily weights . Reviewed upcoming appointment with cardiologist on 04/23/20 and ensured patient has  transportation  Patient Self Care Activities:  . Take Heart Failure Medications as prescribed . Weigh daily and record (notify MD with 3 lb weight gain over night or 5 lb in a week) . Follow CHF Action Plan . Adhere to low sodium diet  Please see past updates related to this goal by clicking on the "Past Updates" button in the selected goal      .  COMPLETED: "I want to get my hernia fixed but I need a referral to a surgeon." (pt-stated)        [place] (see longitudinal plan of care for additional care plan information)  Current Barriers:  [name] Knowledge Deficits related to securing referral to general surgeon - spoke with patient via phone to follow up on recovery from hernia repair surgery on 03/24/20- patient states he is doing well, says blood pressure and blood sugars are meeting treatment targets, no issues with passing urine and bowel movements, voices understanding to call surgeon's office to schedule follow up appointment 2-3 weeks post surgery.  Nurse Case Manager Clinical Goal(s):  [name] Over the next 30-60 days, patient will work with CCM RN and  provider to address needs related to general surgeon consult for hernia repair  Interventions:  . Inter-disciplinary care team collaboration (see longitudinal plan of care) . Assessed recovery from hernia surgery . Ensured patient has phone number for surgeon's office and instructed him per hospital discharge instructions, he is to call to arrange follow up with surgeon 2-3 weeks  post surgery  Patient Self Care Activities:  . Patient verbalizes understanding of plan to work with health care team to secure general surgeon consult.  . Unable to independently secure referral to general surgeon  Please see past updates related to this goal by clicking on the "Past Updates" button in the selected goal      .  "My blood sugar numbers look good now." (pt-stated)         [place] (see longitudinal plan of care for additional care  plan information)  Objective:  Lab Results  Component Value Date   HGBA1C 9.0 (H) 03/19/2020   HGBA1C 10.1 (A) 12/12/2019   HGBA1C 12.7 (A) 07/12/2019   Lab Results  Component Value Date   MICROALBUR 11.3 (H) 04/17/2014   LDLCALC 174 (H) 12/12/2019   CREATININE 1.30 (H) 03/19/2020     Current Barriers:  [name] Knowledge Deficits related to basic Diabetes pathophysiology and self care/management . Knowledge Deficits related to medications used for management of diabetes- spoke with patient to complete follow up assessment, he reports blood sugars are meeting targets and he reports good medication taking behavior  Case Manager Clinical Goal(s):  Over the next 30-60  days, patient will demonstrate improved adherence to prescribed treatment plan for diabetes self care/management as evidenced by:  . daily monitoring and recording of CBG  . adherence to ADA/ carb modified diet . adherence to prescribed medication regimen  Interventions:  . Provided education to patient about basic DM disease process . Reviewed medications and assessed medication taking behavior . Advised patient, providing education and rationale, to check CBG at least daily and record, calling provider and/or CCM RN for findings outside established parameters. . Discussed plans with patient for ongoing care management follow up and provided patient with direct contact information for care management team . Review of patient status, including review of consultants reports, relevant laboratory and other test results, and medications completed.   Patient Self Care Activities:  . UNABLE to independently self manage DM . Self administers oral medications as prescribed . Self administers insulin as prescribed . Self administers injectable DM medication Xultophy as prescribed . Attends all scheduled provider appointments . Checks blood sugars as prescribed and utilize hyper and hypoglycemia protocol as needed . Adheres to  prescribed ADA/carb modified  Please see past updates related to this goal by clicking on the "Past Updates" button in the selected goal            Other   .  Blood Pressure < 130/80           BP Readings from Last 3 Encounters:  04/01/20 (!) 140/94  03/26/20 (!) 146/88  03/19/20 (!) 163/96        Reviewed HTN self management strategies: . Take medications as prescribed . Monitor your blood pressure at home- patient states he is currently using a wrist monitor that a friend loaned him as the cuff on his arm machine is too small and the machine is too awkward to use . Follow a low salt diet- limit consumption of fast foods, packaged foods . Get regular exercise . Maintain a healthy weight . Manage stress      .  Decrease soda intake      .  HEMOGLOBIN A1C < 7        Lab Results  Component Value Date   HGBA1C 9.0 (H) 03/19/2020   HGBA1C 10.1 (A) 12/12/2019   HGBA1C 12.7 (A) 07/12/2019   Lab  Results  Component Value Date   MICROALBUR 11.3 (H) 04/17/2014   LDLCALC 174 (H) 12/12/2019   CREATININE 1.32 (H) 04/01/2020     Not meeting Hgb A1C treatment targets         The patient verbalized understanding of instructions, educational materials, and care plan provided today and declined offer to receive copy of patient instructions, educational materials, and care plan.   The care management team will reach out to the patient again over the next 30-60 days.   [name] RN, CCM, [facility] RN Care Manager [phone]

## 2020-04-03 NOTE — Progress Notes (Signed)
Internal Medicine Clinic Attending  CCM services provided by the care management provider and their documentation were discussed with Dr. Katsadouros . We reviewed the pertinent findings, urgent action items addressed by the resident and non-urgent items to be addressed by the PCP.  I agree with the assessment, diagnosis, and plan of care documented in the CCM and resident's note.  Zoelle Markus, MD 04/03/2020 

## 2020-04-03 NOTE — Progress Notes (Signed)
Internal Medicine Clinic Resident  I have personally reviewed this encounter including the documentation in this note and/or discussed this patient with the care management provider. I will address any urgent items identified by the care management provider and will communicate my actions to the patient's PCP. I have reviewed the patient's CCM visit with my supervising attending, Dr Guilloud.  Vasili Lindsay Straka, MD 04/03/2020   

## 2020-04-22 MED FILL — XULTOPHY 100 UNIT-3.6MG/ML: 100-3.6 | 12 days supply | Qty: 6 | Fill #1

## 2020-04-22 MED FILL — OMEPRAZOLE 40 MG CPDR: 40 | 30 days supply | Qty: 60 | Fill #3

## 2020-04-22 MED FILL — ACCU-CHEK GUIDE TEST STRIP: 33 days supply | Qty: 100 | Fill #3

## 2020-04-23 ENCOUNTER — Encounter: Payer: Self-pay | Admitting: Cardiovascular Disease

## 2020-04-23 ENCOUNTER — Ambulatory Visit (INDEPENDENT_AMBULATORY_CARE_PROVIDER_SITE_OTHER): Payer: Medicaid Other | Admitting: Cardiovascular Disease

## 2020-04-23 ENCOUNTER — Other Ambulatory Visit: Payer: Self-pay

## 2020-04-23 VITALS — BP 120/80 | HR 96 | Ht 73.0 in | Wt 308.0 lb

## 2020-04-23 DIAGNOSIS — I480 Paroxysmal atrial fibrillation: Secondary | ICD-10-CM

## 2020-04-23 DIAGNOSIS — I5042 Chronic combined systolic (congestive) and diastolic (congestive) heart failure: Secondary | ICD-10-CM

## 2020-04-23 DIAGNOSIS — E1169 Type 2 diabetes mellitus with other specified complication: Secondary | ICD-10-CM

## 2020-04-23 DIAGNOSIS — Z5181 Encounter for therapeutic drug level monitoring: Secondary | ICD-10-CM

## 2020-04-23 DIAGNOSIS — E785 Hyperlipidemia, unspecified: Secondary | ICD-10-CM

## 2020-04-23 DIAGNOSIS — I428 Other cardiomyopathies: Secondary | ICD-10-CM

## 2020-04-23 DIAGNOSIS — Z7901 Long term (current) use of anticoagulants: Secondary | ICD-10-CM

## 2020-04-23 DIAGNOSIS — E1142 Type 2 diabetes mellitus with diabetic polyneuropathy: Secondary | ICD-10-CM

## 2020-04-23 DIAGNOSIS — G4733 Obstructive sleep apnea (adult) (pediatric): Secondary | ICD-10-CM

## 2020-04-23 DIAGNOSIS — I10 Essential (primary) hypertension: Secondary | ICD-10-CM

## 2020-04-23 DIAGNOSIS — Z79899 Other long term (current) drug therapy: Secondary | ICD-10-CM

## 2020-04-23 DIAGNOSIS — N1831 Chronic kidney disease, stage 3a: Secondary | ICD-10-CM

## 2020-04-23 NOTE — Patient Instructions (Signed)
Medication Instructions:   CALL WITH ALL MEDICATIONS ESP FUROSEMIDE DOSAGE  *If you need a refill on your cardiac medications before your next appointment, please call your pharmacy*   Lab Work:  Your physician recommends that you return for lab work in: 3 MONTHS  If you have labs (blood work) drawn today and your tests are completely normal, you will receive your results only by: [name] MyChart Message (if you have MyChart) OR . A paper copy in the mail If you have any lab test that is abnormal or we need to change your treatment, we will call you to review the results.   Testing/Procedures:  Your physician has requested that you have an echocardiogram. Echocardiography is a painless test that uses sound waves to create images of your heart. It provides your doctor with information about the size and shape of your heart and how well your heart's chambers and valves are working. This procedure takes approximately one hour. There are no restrictions for this procedure.[place] 3 MONTHS     Follow-Up: At [facility], you and your health needs are our priority.  As part of our continuing mission to provide you with exceptional heart care, we have created designated Provider Care Teams.  These Care Teams include your primary Cardiologist (physician) and Advanced Practice Providers (APPs -  Physician Assistants and Nurse Practitioners) who all work together to provide you with the care you need, when you need it.  We recommend signing up for the patient portal called "MyChart".  Sign up information is provided on this After Visit Summary.  MyChart is used to connect with patients for Virtual Visits (Telemedicine).  Patients are able to view lab/test results, encounter notes, upcoming appointments, etc.  Non-urgent messages can be sent to your provider as well.   To learn more about what you can do with MyChart, go to [email or web address].    Your next appointment:    3 month(s) AFTER ECHO COMPLETED  The format for your next appointment:   In Person  Provider:   [name], MD

## 2020-04-23 NOTE — Progress Notes (Signed)
Patient ID: Terry Parks, male   DOB: 29-May-1955, 64 y.o.   MRN: [id number]    Cardiology Office Note    Date:  04/25/2020   ID:  Terry Parks, DOB 11/24/1955, MRN [id number]  PCP:  [name], MD  Cardiologist:   [name], MD   Chief Complaint  Patient presents with  . Follow-up    4 months.  . Edema    Ankles.    History of Present Illness:  Terry Parks is a 64 y.o. male with  combined systolic and diastolic heart failure (LVEF 35-40% by most recent echo October 2020) paroxysmal atrial fibrillation. He has never had catheterization and has not had angina pectoris, but a nuclear stress test in 2013 showed a fixed anterior defect.  He has systemic hypertension, severe obesity, obstructive sleep apnea on CPAP, and diabetes mellitus. The diagnosis of heart failure dates back to at least 2013 when he had a stress test that showed a fixed anterior defect and an ejection fraction of 30-35%.  He was hospitalized with acute heart failure exacerbation in January 2019, in the setting of acute kidney injury secondary to nephrolithiasis with ureteral obstruction.  During that same admission he had atrial fibrillation, treated with cardioversion but followed by recurrence on atrial flutter, treated with amiodarone and anticoagulation with rivaroxaban.  He he last required hospitalization for heart failure exacerbation in March 2021.  Discharge weight was 318 pounds (only 44 kg and discharge creatinine was 1.57 (baseline 1.3-1.4)  He was hospitalized on November 8 for hernia surgery and did well with the procedure, but did develop transient acute kidney injury with a creatinine peaking at about 2.1.  His diuretic and Entresto and spironolactone were all briefly held, but he was discharged on November 10.  He has really not had any problems with the surgical site.  The most recent creatinine measured on November 16, roughly 1 week after discharge from the hospital was 1.32 and  potassium was 4.0.  He has mild ankle swelling off-and-on.  He denies orthopnea or PND.  He is quite sedentary but for what is worth he does not have angina or dyspnea with light activity.  He complains of his legs and feet hurting" all the time".  The symptoms sound fairly typical for neuropathy, with associated numbness and tingling in both lower extremities.  He has been on amiodarone 200 mg daily for atrial fibrillation and this has worked well.  He last had normal liver function tests and thyroid function tests in August.  He is compliant with anticoagulation with Xarelto.  This was briefly held for his abdominal surgery.  He has not had bleeding complications, falls or injuries.  His hemoglobin was 12.7 postop  He is on comprehensive treatment for congestive heart failure with maximum dose Entresto, maximum dose carvedilol, Jardiance.  His spironolactone has not been restarted since the hospital stay.  I am very confused about his current diuretic regimen.  He tells me that he is taking torsemide 20 mg daily but is also taking furosemide 3 tablets twice daily (he thinks the dose of the furosemide is 40 mg/tab., but is not entirely sure).  Glycemic control has improved, but remains suboptimal.  The most recent hemoglobin A1c was 9% on 03/19/2020.  His lipid profile was very unfavorable when last checked in July and he had inadvertently stopped his atorvastatin.  The statin has been restarted but his lipid profile has not been checked since.  On November 17 he was seen by one  of our chronic care nurses, his chronic medical conditions were reviewed and generally found to be adequately addressed.  I am not sure whether his diuretic medications were directly reviewed at that time.   Past Medical History:  Diagnosis Date  . Abscessed tooth    top back large cavity no pain or drainage, one on bottom  pt pulled tooth 4-5 months ago, right top large hole in tooth  . AKI (acute kidney injury) ([place])   .  Allergic rhinitis   . Anemia   . Anxiety   . Asthma   . Atrial fibrillation ([place])   . BPH (benign prostatic hypertrophy)    Massive BPH noted on cystoscopy 1/23/ 2012 by Dr. Risa Grill.  . Cancer [facility])    prostate cancer 2019  . Cardiomyopathy ([place])   . CHF (congestive heart failure) ([place])   . Cough 03/30/2012  . Depression   . Diabetes mellitus 04/08/2008   type 2  . Dyspnea   . Dysrhythmia 2019  . Foley catheter in place 07-05-17 placed  . Fracture, orbital ([place]) 2021   Right  . GERD (gastroesophageal reflux disease) 2020  . Headache(784.0)    hx migraines none recent  . Hyperlipemia   . Hypertension   . Hypertensive cardiopathy 03/01/2006   2-D echocardiogram 02/01/2012 showed moderate LVH, mildly to moderately reduced left ventricular systolic function with an estimated ejection fraction of 40-45%, and diffuse hypokinesis.  A nuclear medicine stress study done 01/31/2012 showed no reversible ischemia, a small mid anterior wall fixed defect/infarct, and ejection fraction 42%.      . Neck pain   . Nephrolithiasis 05/29/2010   CT scan of abdomen/pelvis on 05/29/2010 showed an obstructing approximate 1-2 mm calculus at the left UVJ, and an approximate 1-2 mm left lower pole renal calculus.   Patient had continuing severe pain , and an elevation of his serum creatinine to a value of 1.75 on 06/06/2010.  Patient underwent cystoscopy on 06/08/2010 by Dr. Risa Grill, but attempts at retrograde pyelogram and ureteroscopy were unsucc  . Numbness 01/08/2018  . Obstructive sleep apnea 03/06/2008   Sleep study 03/06/08 showed severe OSA/hypopnea syndrome, with successful CPAP titration to 13 CWP using a medium ResMed Mirage Quattro full face mask with heated humidifier.   . Rash 04/17/2014  . Renal calculus 05/29/2010   CT scan of abdomen/pelvis on 05/29/2010 showed an obstructing approximate 1-2 mm calculus at the left UVJ, and an approximate 1-2 mm left lower pole renal calculus.   Patient had continuing  severe pain , and an elevation of his serum creatinine to a value of 1.75 on 06/06/2010.  The stone had apparently passed and was not seen on repeat CT 06/08/2010.  . Sleep apnea    haven't use cpap in 2 years  . Tooth pain 10/29/2017  . Urinary straining 11/02/2016    Past Surgical History:  Procedure Laterality Date  . big toe nails removed Bilateral 20 yrs ago  . CARDIOVERSION N/A 06/08/2017   Procedure: CARDIOVERSION;  Surgeon: Josue Hector, MD;  Location: [facility] ENDOSCOPY;  Service: Cardiovascular;  Laterality: N/A;  . COLONOSCOPY    . CYSTOSCOPY W/ RETROGRADES    . FRACTURE SURGERY Right 2021   5th right phalange  . INCISIONAL HERNIA REPAIR N/A 03/24/2020   Procedure: LAPAROSCOPIC INCISIONAL HERNIA REPAR WITH MESH;  Surgeon: Georganna Skeans, MD;  Location: [place];  Service: General;  Laterality: N/A;  . IR RADIOLOGIST EVAL & MGMT  02/02/2018  . LYMPHADENECTOMY Bilateral 10/06/2017  Procedure: LYMPHADENECTOMY;  Surgeon: Lucas Mallow, MD;  Location: [facility] ORS;  Service: Urology;  Laterality: Bilateral;  . ROBOT ASSISTED LAPAROSCOPIC RADICAL PROSTATECTOMY N/A 10/06/2017   Procedure: XI ROBOTIC ASSISTED LAPAROSCOPIC RADICAL PROSTATECTOMY;  Surgeon: Lucas Mallow, MD;  Location: [facility] ORS;  Service: Urology;  Laterality: N/A;  . TRANSURETHRAL RESECTION OF BLADDER TUMOR N/A 07/13/2017   Procedure: TRANSURETHRAL RESECTION OF PROSTATE;  Surgeon: Lucas Mallow, MD;  Location: [facility] ORS;  Service: Urology;  Laterality: N/A;    Outpatient Medications Prior to Visit  Medication Sig Dispense Refill  . Accu-Chek Softclix Lancets lancets Check blood sugar three times a day as instructed 300 each 3  . acetaminophen (TYLENOL) 325 MG tablet Take 650 mg by mouth every 6 (six) hours as needed for moderate pain.     [name] amiodarone (PACERONE) 200 MG tablet TAKE 1 TABLET BY MOUTH DAILY. 30 tablet 1  . Blood Glucose Monitoring Suppl (ACCU-CHEK AVIVA) device Use as instructed to check blood sugar up to 3  times a day 1 each 0  . carvedilol (COREG) 25 MG tablet Take 1 tablet (25 mg total) by mouth 2 (two) times daily with a meal. 180 tablet 1  . docusate sodium (COLACE) 100 MG capsule Take 1 capsule (100 mg total) by mouth 2 (two) times daily. 60 capsule 2  . empagliflozin (JARDIANCE) 10 MG TABS tablet Take 1 tablet (10 mg total) by mouth daily before breakfast. 30 tablet 2  . 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 insulin dependent 300 each 11  . ibuprofen (ADVIL) 200 MG tablet Take 800 mg by mouth every 8 (eight) hours as needed for moderate pain.    . Insulin Degludec-Liraglutide (XULTOPHY) 100-3.6 UNIT-MG/ML SOPN Inject 50 Units into the skin daily. 5 pen 11  . Insulin Pen Needle (CARETOUCH PEN NEEDLES) 31G X 6 MM MISC 1 pen by Does not apply route at bedtime. 100 each 3  . metFORMIN (GLUCOPHAGE XR) 500 MG 24 hr tablet Take 4 tablets (2,000 mg total) by mouth daily with breakfast. 360 tablet 3  . nystatin (NYSTATIN) powder Apply 1 application topically 3 (three) times daily. 60 g 1  . omeprazole (PRILOSEC) 40 MG capsule Take 1 capsule (40 mg total) by mouth 2 (two) times daily before a meal. 60 capsule 3  . oxyCODONE (ROXICODONE) 5 MG immediate release tablet Take 1 tablet (5 mg total) by mouth every 6 (six) hours as needed. Alternate each dose with tylenol. 20 tablet 0  . rivaroxaban (XARELTO) 20 MG TABS tablet Take 1 tablet (20 mg total) by mouth daily with breakfast. 90 tablet 0  . sacubitril-valsartan (ENTRESTO) 97-103 MG Take 1 tablet by mouth 2 (two) times daily. 180 tablet 1  . torsemide (DEMADEX) 20 MG tablet Take 1 tablet (20 mg total) by mouth daily. 14 tablet 0  . atorvastatin (LIPITOR) 40 MG tablet Take 1 tablet (40 mg total) by mouth daily. 90 tablet 3   No facility-administered medications prior to visit.     Allergies:   Lisinopril   Social History   Socioeconomic History  . Marital status: Single    Spouse name:  Not on file  . Number of children: 4  . Years of education: 29  . Highest education level: Not on file  Occupational History  . Occupation:        Employer: UNEMPLOYED  Tobacco Use  . Smoking status: Never Smoker  .  Smokeless tobacco: Never Used  Vaping Use  . Vaping Use: Never used  Substance and Sexual Activity  . Alcohol use: No    Alcohol/week: 0.0 standard drinks  . Drug use: No  . Sexual activity: Not Currently  Other Topics Concern  . Not on file  Social History Narrative   Divorced, 4 children, lives alone.     Social Determinants of Health   Financial Resource Strain: Low Risk   . Difficulty of Paying Living Expenses: Not hard at all  Food Insecurity: No Food Insecurity  . Worried About Charity fundraiser in the Last Year: Never true  . Ran Out of Food in the Last Year: Never true  Transportation Needs: No Transportation Needs  . Lack of Transportation (Medical): No  . Lack of Transportation (Non-Medical): No  Physical Activity: Inactive  . Days of Exercise per Week: 0 days  . Minutes of Exercise per Session: 0 min  Stress: Not on file  Social Connections: Unknown  . Frequency of Communication with Friends and Family: More than three times a week  . Frequency of Social Gatherings with Friends and Family: More than three times a week  . Attends Religious Services: More than 4 times per year  . Active Member of Clubs or Organizations: Not on file  . Attends Archivist Meetings: Not on file  . Marital Status: Not on file     Family History:  The patient's family history includes Breast cancer in his mother; Diabetes in his maternal grandmother; Hypertension in his father.   ROS:   Please see the history of present illness.    All other systems are reviewed and are negative.   PHYSICAL EXAM:   VS:  BP 120/80 (BP Location: Left Arm, Patient Position: Sitting, Cuff Size: Large)   Pulse 96   Ht 6\' 1"  (1.854 m)   Wt (!) 308 lb (139.7 kg)   BMI 40.64  kg/m     General: Alert, oriented x3, no distress, morbidly obese Head: no evidence of trauma, PERRL, EOMI, no exophtalmos or lid lag, no myxedema, no xanthelasma; normal ears, nose and oropharynx Neck: normal jugular venous pulsations and no hepatojugular reflux; brisk carotid pulses without delay and no carotid bruits Chest: clear to auscultation, no signs of consolidation by percussion or palpation, normal fremitus, symmetrical and full respiratory excursions Cardiovascular: normal position and quality of the apical impulse, regular rhythm, normal first and second heart sounds, no murmurs, rubs or gallops Abdomen: no tenderness or distention, no masses by palpation, no abnormal pulsatility or arterial bruits, normal bowel sounds, no hepatosplenomegaly Extremities: no clubbing, cyanosis, symmetrical 1+ ankle edema; 2+ radial, ulnar and brachial pulses bilaterally; 2+ right femoral, posterior tibial and dorsalis pedis pulses; 2+ left femoral, posterior tibial and dorsalis pedis pulses; no subclavian or femoral bruits Neurological: grossly nonfocal Psych: Normal mood and affect   Wt Readings from Last 3 Encounters:  04/23/20 (!) 308 lb (139.7 kg)  04/01/20 (!) 317 lb 12.8 oz (144.2 kg)  03/25/20 (!) 320 lb 1.7 oz (145.2 kg)      Studies/Labs Reviewed:   EKG:  EKG is ordered today.  It shows normal sinus rhythm and a prolonged QTC at 490 ms (on amiodarone), but is otherwise a completely normal tracing.  Recent Labs: 08/10/2019: B Natriuretic Peptide 220.7 08/13/2019: Magnesium 2.3 01/11/2020: ALT 13; TSH 0.686 03/25/2020: Hemoglobin 12.7; Platelets 182 04/01/2020: BUN 9; Creatinine, Ser 1.32; Potassium 4.0; Sodium 139   Lipid Panel  Component Value Date/Time   CHOL 236 (H) 12/12/2019 1716   TRIG 182 (H) 12/12/2019 1716   HDL 28 (L) 12/12/2019 1716   CHOLHDL 8.4 (H) 12/12/2019 1716   CHOLHDL 3.9 01/09/2018 0509   VLDL 11 01/09/2018 0509   LDLCALC 174 (H) 12/12/2019 1716      ASSESSMENT:    1. Chronic combined systolic and diastolic CHF (congestive heart failure) ([place])   2. PAF (paroxysmal atrial fibrillation) ([place])   3. Long term (current) use of anticoagulants   4. Encounter for monitoring amiodarone therapy   5. Essential hypertension   6. Nonischemic cardiomyopathy ([place])   7. OSA (obstructive sleep apnea)   8. Type 2 diabetes mellitus with peripheral neuropathy ([facility])   9. Stage 3a chronic kidney disease ([place])   10. Morbid obesity ([place])   11. Hyperlipidemia associated with type 2 diabetes mellitus ([place])      PLAN:  In order of problems listed above:  1. CHF: NYHA functional class I-2, clinically euvolemic, weight 10 pounds less than was at discharge from his heart failure admission earlier this year.  His most recent creatinine was at baseline.  As far as I can tell he is on the right amount of diuretics, but I am concerned that he is taking 2 different types of loop diuretic and this may cause serious confusion and complications in the future.  Otherwise he is on excellent heart failure maintenance therapy with full dose Entresto and carvedilol and Jardiance (not yet back on spironolactone.  Need to figure out exactly what medicines he is on.  We will try to reach out to our chronic care nurses to see if they can help).  Would like to reevaluate left ventricular ejection fraction now that he is on truly comprehensive medical therapy. 2. AFib: No clinical recurrence of atrial fibrillation.  No arrhythmic events during his recent admission for laparoscopic hernia repair.[name]  CHADSVasc 3-4 (HTN, CHF, DM, +/- CAD) on anticoagulation. 3. Amiodarone monitoring: Labs last checked on August 27.  Will need repeat TSH and liver function tests in about 3 months. 4. Anticoagulation: No bleeding complications. 5. HTN: Excellent control. 6. CMP: Although he has never had angiography, he most likely has nonischemic cardiomyopathy due to poorly treated hypertension.   Even though a remote nuclear stress test showed an anterior defect, the echo shows global left ventricular hypokinesis and he does not have any Q waves on ECG.  He has never had angina pectoris.  His LVEF seems to vary in direct relationship to the success of blood pressure control.  I suspect he has primarily nonischemic cardiomyopathy. 7. OSA: He reports compliance with CPAP and he denies any problems with daytime hypersomnolence. 8. DM: He has retinopathy and appears to have nephropathy.  His symptoms of A1c has improved substantially up to 9%, but is still not in target range. 9. CKD 3a: Transient worsening of renal function during hospitalization for surgery, but back to baseline by 11/16. 10. Obesity: Underlies most if not all of his chronic health problems.  Strongly encourage attempts at weight loss. 11. HLP: His statin was somehow interrupted and his LDL was all the way up to 174.  We will recheck his lipid profile when we checked his amiodarone monitoring labs in a few months.  His medication regimen is very complicated and he continues to have a lot of confusion regarding the purpose of all these medicines.  To avoid repeat hospitalization and worsening of his health status, it will be important  now periodic assistance and clarification of his medical regimen.   Medication Adjustments/Labs and Tests Ordered: Current medicines are reviewed at length with the patient today.  Concerns regarding medicines are outlined above.  Medication changes, Labs and Tests ordered today are listed in the Patient Instructions below. Patient Instructions  Medication Instructions:   CALL WITH ALL MEDICATIONS ESP FUROSEMIDE DOSAGE  *If you need a refill on your cardiac medications before your next appointment, please call your pharmacy*   Lab Work:  Your physician recommends that you return for lab work in: 3 MONTHS  If you have labs (blood work) drawn today and your tests are completely normal, you  will receive your results only by: [name] MyChart Message (if you have MyChart) OR . A paper copy in the mail If you have any lab test that is abnormal or we need to change your treatment, we will call you to review the results.   Testing/Procedures:  Your physician has requested that you have an echocardiogram. Echocardiography is a painless test that uses sound waves to create images of your heart. It provides your doctor with information about the size and shape of your heart and how well your heart's chambers and valves are working. This procedure takes approximately one hour. There are no restrictions for this procedure.[place] 3 MONTHS     Follow-Up: At [facility], you and your health needs are our priority.  As part of our continuing mission to provide you with exceptional heart care, we have created designated Provider Care Teams.  These Care Teams include your primary Cardiologist (physician) and Advanced Practice Providers (APPs -  Physician Assistants and Nurse Practitioners) who all work together to provide you with the care you need, when you need it.  We recommend signing up for the patient portal called "MyChart".  Sign up information is provided on this After Visit Summary.  MyChart is used to connect with patients for Virtual Visits (Telemedicine).  Patients are able to view lab/test results, encounter notes, upcoming appointments, etc.  Non-urgent messages can be sent to your provider as well.   To learn more about what you can do with MyChart, go to [email or web address].    Your next appointment:   3 month(s) AFTER ECHO COMPLETED  The format for your next appointment:   In Person  Provider:   [name], MD    Signed, [name], MD  04/25/2020 2:25 PM    [facility] [address], [place], [place]  [address] Phone: [phone]; Fax: [phone]

## 2020-04-25 ENCOUNTER — Ambulatory Visit: Payer: Medicaid Other | Admitting: *Deleted

## 2020-04-25 DIAGNOSIS — E1142 Type 2 diabetes mellitus with diabetic polyneuropathy: Secondary | ICD-10-CM

## 2020-04-25 DIAGNOSIS — I4819 Other persistent atrial fibrillation: Secondary | ICD-10-CM

## 2020-04-25 DIAGNOSIS — N183 Chronic kidney disease, stage 3 unspecified: Secondary | ICD-10-CM

## 2020-04-25 DIAGNOSIS — H5461 Unqualified visual loss, right eye, normal vision left eye: Secondary | ICD-10-CM

## 2020-04-25 DIAGNOSIS — I5042 Chronic combined systolic (congestive) and diastolic (congestive) heart failure: Secondary | ICD-10-CM

## 2020-04-25 NOTE — Chronic Care Management (AMB) (Signed)
  Care Management   Note  04/25/2020 Name: Alver Leete MRN: [id number] DOB: 05-24-1955  [place] Ground is enrolled in a Managed Medicaid plan: Yes. Outreach attempt today was successful.   Received In Basket message from Dr.[name] requesting clarification from patient regarding loop diuretic therapy. Successful outreach to patient via phone. Patient states he was mistaken when he told Dr. Sallyanne Kuster that he was taking both furosemide and torsemide during his office visit on 04/23/20.  Patient states he is only taking torsemide and has not been taking furosemide for 'quite a while."  Discussed reason for not taking 2 loop diuretics at the same time and patient voiced understanding.  Sent  In Basket message to Dr. Sallyanne Kuster advising him patient is only taking torsemide.    Telephone follow up appointment with care management team member scheduled for:05/02/20  [name] RN, CCM, [facility] RN Care Manager [phone]

## 2020-05-01 ENCOUNTER — Telehealth: Payer: Self-pay | Admitting: *Deleted

## 2020-05-01 NOTE — Telephone Encounter (Signed)
Information was sent through CoverMyMeds for  PA for Xultophy.   Approved 05/01/2020 thru 05/01/2021.  Sander Nephew, RN 05/01/2020 9:30 AM.

## 2020-05-02 ENCOUNTER — Ambulatory Visit: Payer: Medicaid Other | Admitting: *Deleted

## 2020-05-02 DIAGNOSIS — I152 Hypertension secondary to endocrine disorders: Secondary | ICD-10-CM

## 2020-05-02 DIAGNOSIS — I5042 Chronic combined systolic (congestive) and diastolic (congestive) heart failure: Secondary | ICD-10-CM

## 2020-05-02 DIAGNOSIS — I4819 Other persistent atrial fibrillation: Secondary | ICD-10-CM

## 2020-05-05 NOTE — Chronic Care Management (AMB) (Signed)
Care Management   Follow Up Note   05/05/2020 Name: Terry Parks MRN: [id number] DOB: 05-31-1955  Terry Parks is enrolled in a Managed Medicaid plan: Yes. Outreach attempt today was successful.    Referred by: [name], MD Reason for referral : Care Coordination (IDDM, HF, HTN, PAF, CKD)   Terry Parks is a 64 y.o. year old male who is a primary care patient of [name], MD. The care management team was consulted for assistance with care management and care coordination needs.    Review of patient status, including review of consultants reports, relevant laboratory and other test results, and collaboration with appropriate care team members and the patient's provider was performed as part of comprehensive patient evaluation and provision of chronic care management services.    Goals Addressed              This Visit's Progress     Patient Stated     COMPLETED: " I have been feeling really fatigued lately, my legs are hurting and cramping again. I think I may need to see a specialist in [facility]." (pt-stated)        [place] (see longitudinal plan of care for additional care plan information)  Current Barriers:   Chronic Disease Management support, education, and care coordination needs related to HF, DM and HTN- patient states doing much better, no onger fatigued or with leg pain and cramping, reports good medication taking behavior without adverse effects, he reports he continues to  record his BP, Weight, pulse daily in his phone, he voices the correct parameters that require provider notification, he says he is pleased with his current PCS worker and that he receives services 5 times weekly  Clinical Goal(s) related to HF, DM and HTN: Over the next 30 - 60 days, patient will:   Work with the care management team to address educational, disease management, and care coordination needs   Begin or continue self health monitoring  activities as directed today Measure and record CBG (blood glucose) at least one time daily and Measure and record blood pressure one time daily  Call provider office for new or worsened signs and symptoms Blood glucose findings outside established parameters, Blood pressure findings outside established parameters, Chest pain, Shortness of breath, and New or worsened symptom related to HF and DM II  and HTN  Call care management team with questions or concerns  Verbalize basic understanding of patient centered plan of care established today  Interventions related to HF, DM and HTN:   Evaluation of current treatment plans and patient's adherence to plan as established by provider  Assessed patient understanding of disease states  Assessed patient's education and care coordination needs  Provided disease specific education to patient   Collaborated with appropriate clinical care team members regarding patient needs  Again, positive reinforcement provide to patient for taking daily vitals and recording them in his phone and taking responsibility for his health  Provided time for patient to express his frustration related to his chronic issues and suggested he might get help with his leg pain if he saw a neurologist  Joined in patient's thankfullness of feeling better so he can enjoy the holidays   Patient Self Care Activities related to HF, DM and HTN:   Patient is unable to independently self-manage chronic health conditions  Weighs daily and record (notifying MD of 3 lb weight gain over night or 5 lb in a week)  Takes Heart Failure Medications as  prescribed  Verbalizes understanding of and follows CHF Action Plan  Adheres to low sodium diet  Please see past updates related to this goal by clicking on the "Past Updates" button in the selected goal        COMPLETED: "Can I remove my band" (pt-stated)         Patient Goals/Self Care Activities related to Inguinal Hernia  Repair   Patient verbalizes understanding of plan   Self-administers medications as prescribed  Calls pharmacy for medication refills  Call's provider office for new concerns or questions  Call Surgeons' office concerning question regarding his belly band. 04/25/20- reviewed hospital d/c instructions with patient and advised him to continue to wear the abdominal band unless he is showering or bathing until he says his surgeon in follow up       "[name] told me I have heart failure and he explained it to me real well." (pt-stated)        [place] (see longitudinal plan of care for additional care plan information)  Current Barriers:   Knowledge deficit related to basic heart failure pathophysiology and self care management- reviewed medications with patient and emphasized he should only be taking torsemide 20 mg daily and no other diuretic, patient voices the importance of daily weights and was able to correctly identify the weight gain parameters that would require MD notification Nurse Case Manager Clinical Goal(s):   Over the next 30-90 days, patient will weigh self daily and record  Over the next 30-90  days, patient will verbalize understanding of Heart Failure Action Plan and when to call doctor  Over the next 30-90 days, patient will take all Heart Failure mediations as prescribed  Interventions:   Basic overview and discussion of pathophysiology of Heart Failure  Reviewed cardiology office  note of 04/23/20 with patient  Reiterated importance of good medication behavior and ensured patient is now taking all prescribed medications  Reviewed Heart Failure Action Plan   Reviewed importance of daily weights  Reviewed upcoming appointment for 2 d echo on 07/17/20 and with cardiologist on 08/07/20 and ensured patient has transportation  Patient Self Care Activities:   Take Heart Failure Medications as prescribed  Weigh daily and record (notify MD with 3 lb weight  gain over night or 5 lb in a week)  Follow CHF Action Plan  Adhere to low sodium diet  Please see past updates related to this goal by clicking on the "Past Updates" button in the selected goal        "My blood sugar numbers look good now." (pt-stated)         CARE PLAN ENTRY (see longitudinal plan of care for additional care plan information)  Objective:  Lab Results  Component Value Date   HGBA1C 9.0 (H) 03/19/2020   HGBA1C 10.1 (A) 12/12/2019   HGBA1C 12.7 (A) 07/12/2019   Lab Results  Component Value Date   MICROALBUR 11.3 (H) 04/17/2014   Lyman 174 (H) 12/12/2019   CREATININE 1.30 (H) 03/19/2020     Current Barriers:   Knowledge Deficits related to basic Diabetes pathophysiology and self care/management  Knowledge Deficits related to medications used for management of diabetes- spoke with patient to complete follow up assessment, he reports blood sugars are meeting targets and he reports good medication taking behavior, denies hypoglycemia  Case Manager Clinical Goal(s):  Over the next 30-60  days, patient will demonstrate improved adherence to prescribed treatment plan for diabetes self care/management as evidenced by:  daily monitoring and recording of CBG   adherence to ADA/ carb modified diet  adherence to prescribed medication regimen  Interventions:   Provided education to patient about basic DM disease process  Reviewed medications and assessed medication taking behavior  Ensured patient has ample supply of all prescribed medications  Advised patient, providing education and rationale, to check CBG at least daily and record, calling provider and/or CCM RN for findings outside established parameters.  Discussed plans with patient for ongoing care management follow up and provided patient with direct contact information for care management team  Review of patient status, including review of consultants reports, relevant laboratory and other test  results, and medications completed.   Patient Self Care Activities:   UNABLE to independently self manage DM  Self administers oral medications as prescribed  Self administers insulin as prescribed  Self administers injectable DM medication Xultophy as prescribed  Attends all scheduled provider appointments  Checks blood sugars as prescribed and utilize hyper and hypoglycemia protocol as needed  Adheres to prescribed ADA/carb modified  Please see past updates related to this goal by clicking on the "Past Updates" button in the selected goal              The care management team will reach out to the patient again over the next 30-60 days.   [name] RN, CCM, [facility] RN Care Manager [phone]

## 2020-05-05 NOTE — Patient Instructions (Signed)
Visit Information It was nice speaking with you today. Goals Addressed              This Visit's Progress     Patient Stated   .  COMPLETED: " I have been feeling really fatigued lately, my legs are hurting and cramping again. I think I may need to see a specialist in [facility]." (pt-stated)        Terry Parks (see longitudinal plan of care for additional care plan information)  Current Barriers:  . Chronic Disease Management support, education, and care coordination needs related to HF, DM and HTN- patient states doing much better, no onger fatigued or with leg pain and cramping, reports good medication taking behavior without adverse effects, he reports he continues to  record his BP, Weight, pulse daily in his phone, he voices the correct parameters that require provider notification, he says he is pleased with his current P[employer] worker and that he receives services 5 times weekly  Clinical Goal(s) related to HF, DM and HTN: Over the next 30 - 60 days, patient will:  . Work with the care management team to address educational, disease management, and care coordination needs  . Begin or continue self health monitoring activities as directed today Measure and record CBG (blood glucose) at least one time daily and Measure and record blood pressure one time daily . Call provider office for new or worsened signs and symptoms Blood glucose findings outside established parameters, Blood pressure findings outside established parameters, Chest pain, Shortness of breath, and New or worsened symptom related to HF and DM II  and HTN . Call care management team with questions or concerns . Verbalize basic understanding of patient centered plan of care established today  Interventions related to HF, DM and HTN:  . Evaluation of current treatment plans and patient's adherence to plan as established by provider . Assessed patient understanding of disease states . Assessed patient's education and  care coordination needs . Provided disease specific education to patient  . Collaborated with appropriate clinical care team members regarding patient needs . Again, positive reinforcement provide to patient for taking daily vitals and recording them in his phone and taking responsibility for his health . Provided time for patient to express his frustration related to his chronic issues and suggested he might get help with his leg pain if he saw a neurologist . Joined in patient's thankfullness of feeling better so he can enjoy the holidays   Patient Self Care Activities related to HF, DM and HTN:  . Patient is unable to independently self-manage chronic health conditions . Weighs daily and record (notifying MD of 3 lb weight gain over night or 5 lb in a week) . Takes Heart Failure Medications as prescribed . Verbalizes understanding of and follows CHF Action Plan . Adheres to low sodium diet  Please see past updates related to this goal by clicking on the "Past Updates" button in the selected goal      .  COMPLETED: "Can I remove my band" (pt-stated)        . Patient Goals/Self Care Activities related to Inguinal Hernia Repair .  Patient verbalizes understanding of plan .  Self-administers medications as prescribed . Calls pharmacy for medication refills . Call's provider office for new concerns or questions . Call Surgeons' office concerning question regarding his belly band. 04/25/20- reviewed hospital d/c instructions with patient and advised him to continue to wear the abdominal band unless he is showering  or bathing until he says his surgeon in follow up     .  "[name] told me I have heart failure and he explained it to me real well." (pt-stated)        CARE PLAN ENTRY (see longitudinal plan of care for additional care plan information)  Current Barriers:  [name] Knowledge deficit related to basic heart failure pathophysiology and self care management- reviewed medications with patient  and emphasized he should only be taking torsemide 20 mg daily and no other diuretic, patient voices the importance of daily weights and was able to correctly identify the weight gain parameters that would require MD notification Nurse Case Manager Clinical Goal(s):   Over the next 30-90 days, patient will weigh self daily and record  Over the next 30-90  days, patient will verbalize understanding of Heart Failure Action Plan and when to call doctor  Over the next 30-90 days, patient will take all Heart Failure mediations as prescribed  Interventions:  . Basic overview and discussion of pathophysiology of Heart Failure . Reviewed cardiology office  note of 04/23/20 with patient . Reiterated importance of good medication behavior and ensured patient is now taking all prescribed medications . Reviewed Heart Failure Action Plan  . Reviewed importance of daily weights . Reviewed upcoming appointment for 2 d echo on 07/17/20 and with cardiologist on 08/07/20 and ensured patient has transportation  Patient Self Care Activities:  . Take Heart Failure Medications as prescribed . Weigh daily and record (notify MD with 3 lb weight gain over night or 5 lb in a week) . Follow CHF Action Plan . Adhere to low sodium diet  Please see past updates related to this goal by clicking on the "Past Updates" button in the selected goal      .  "My blood sugar numbers look good now." (pt-stated)         CARE PLAN ENTRY (see longitudinal plan of care for additional care plan information)  Objective:  Lab Results  Component Value Date   HGBA1C 9.0 (H) 03/19/2020   HGBA1C 10.1 (A) 12/12/2019   HGBA1C 12.7 (A) 07/12/2019   Lab Results  Component Value Date   MICROALBUR 11.3 (H) 04/17/2014   LDLCALC 174 (H) 12/12/2019   CREATININE 1.30 (H) 03/19/2020     Current Barriers:  [name] Knowledge Deficits related to basic Diabetes pathophysiology and self care/management . Knowledge Deficits related to medications  used for management of diabetes- spoke with patient to complete follow up assessment, he reports blood sugars are meeting targets and he reports good medication taking behavior, denies hypoglycemia  Case Manager Clinical Goal(s):  Over the next 30-60  days, patient will demonstrate improved adherence to prescribed treatment plan for diabetes self care/management as evidenced by:  . daily monitoring and recording of CBG  . adherence to ADA/ carb modified diet . adherence to prescribed medication regimen  Interventions:  . Provided education to patient about basic DM disease process . Reviewed medications and assessed medication taking behavior . Ensured patient has ample supply of all prescribed medications . Advised patient, providing education and rationale, to check CBG at least daily and record, calling provider and/or CCM RN for findings outside established parameters. . Discussed plans with patient for ongoing care management follow up and provided patient with direct contact information for care management team . Review of patient status, including review of consultants reports, relevant laboratory and other test results, and medications completed.   Patient Self Care Activities:  .  UNABLE to independently self manage DM . Self administers oral medications as prescribed . Self administers insulin as prescribed . Self administers injectable DM medication Xultophy as prescribed . Attends all scheduled provider appointments . Checks blood sugars as prescribed and utilize hyper and hypoglycemia protocol as needed . Adheres to prescribed ADA/carb modified  Please see past updates related to this goal by clicking on the "Past Updates" button in the selected goal             The patient verbalized understanding of instructions, educational materials, and care plan provided today and declined offer to receive copy of patient instructions, educational materials, and care plan.   The  care management team will reach out to the patient again over the next 30-60 days.   [name] RN, CCM, [facility] RN Care Manager [phone]

## 2020-05-19 ENCOUNTER — Other Ambulatory Visit (HOSPITAL_COMMUNITY): Payer: Medicaid Other

## 2020-05-29 ENCOUNTER — Other Ambulatory Visit: Payer: Self-pay | Admitting: Cardiovascular Disease

## 2020-05-29 ENCOUNTER — Other Ambulatory Visit: Payer: Self-pay | Admitting: Internal Medicine

## 2020-05-29 DIAGNOSIS — Z8719 Personal history of other diseases of the digestive system: Secondary | ICD-10-CM

## 2020-05-29 DIAGNOSIS — I4819 Other persistent atrial fibrillation: Secondary | ICD-10-CM

## 2020-05-29 MED FILL — METFORMIN HCL ER 500 MG TB2: 500 | 90 days supply | Qty: 360 | Fill #0

## 2020-05-29 MED FILL — CARVEDILOL 25 MG TABLET: 25 | 90 days supply | Qty: 180 | Fill #0

## 2020-05-29 MED FILL — ATORVASTATIN 40 MG TABLET: 40 | 90 days supply | Qty: 90 | Fill #1

## 2020-05-29 MED FILL — ACCU-CHEK GUIDE TEST STRIP: 33 days supply | Qty: 100 | Fill #4

## 2020-05-30 ENCOUNTER — Other Ambulatory Visit: Payer: Self-pay | Admitting: Internal Medicine

## 2020-05-30 MED FILL — XULTOPHY 100 UNIT-3.6MG/ML: 100-3.6 | 12 days supply | Qty: 6 | Fill #2

## 2020-05-30 MED FILL — OMEPRAZOLE 40 MG CPDR: 40 | 30 days supply | Qty: 60 | Fill #0

## 2020-06-02 ENCOUNTER — Telehealth: Payer: Medicaid Other

## 2020-06-03 ENCOUNTER — Ambulatory Visit: Payer: Medicaid Other | Admitting: *Deleted

## 2020-06-03 DIAGNOSIS — I4819 Other persistent atrial fibrillation: Secondary | ICD-10-CM

## 2020-06-03 DIAGNOSIS — E1142 Type 2 diabetes mellitus with diabetic polyneuropathy: Secondary | ICD-10-CM

## 2020-06-03 DIAGNOSIS — I5042 Chronic combined systolic (congestive) and diastolic (congestive) heart failure: Secondary | ICD-10-CM

## 2020-06-03 DIAGNOSIS — E1159 Type 2 diabetes mellitus with other circulatory complications: Secondary | ICD-10-CM

## 2020-06-03 DIAGNOSIS — N183 Chronic kidney disease, stage 3 unspecified: Secondary | ICD-10-CM

## 2020-06-03 DIAGNOSIS — I152 Hypertension secondary to endocrine disorders: Secondary | ICD-10-CM

## 2020-06-03 NOTE — Patient Instructions (Signed)
Visit Information It was nice speaking with you today.  Patient Care Plan: RN Care Manager  Completed 06/03/2020  Problem Identified: Inquinal Hernia Repair TOC. Resolved 06/03/2020  Priority: High  Note:   . Chronic Disease Management support, education, and care coordination needs related to IP event on 11//8/21-11/9/21 for Inguinal Hernia Repair  . Clinical Goal(s) related to Inguinal Hernia Repair   Over the next 14 days, patient will:  . Work with the care management team to address educational, disease management, and care coordination needs  . Call Darnetta Kesselman office for new or worsened signs and symptoms  . Call care management team with questions or concerns . Verbalize basic understanding of patient centered plan of care established today  Interventions related to Inguinal Hernia Repair:  . Evaluation of current treatment plans and patient's adherence to plan as established by Myeisha Kruser . Assessed patient understanding of disease states- patient asked about the band that he is wearing if he can take it off.  I advised him to call the surgery center for them to advise him what would be appropriate. . Assessed patient's education and care coordination needs . Provided disease specific education to patient  . Collaborated with appropriate clinical care team members regarding patient needs . Medications reviewed-patient states that he did not pick up the pain medication because he does not need it.  He is not in pain he is just sore.  He did not get the colace or torsemide because he has some at home. He states that he has had a bowel movement since being at home and feeling well.  He is staying hydrated and eating.  He is not eating as much as he usually does because he doesn't have that much of an appetite.  Dicussed Diet and the protein in his diet for the healing process. He verbalized understanding. . Scheduled Appointments Reviewed: Patient know that he has an appointment with Kathyrn Drown NP 04/01/20 at 73 am [address] [place] and Dr Sallyanne Kuster. 04/23/20 925 am [place].  He states that he has transportation to the appointments.  . Patient Goals/Self Care Activities related to Inguinal Hernia Repair .  Patient verbalizes understanding of plan .  Self-administers medications as prescribed . Calls pharmacy for medication refills . Call's Yocelin Vanlue office for new concerns or questions . Call Surgeons' office concerning question regarding his belly band.  Follow up Plan: The care management team will reach out to the patient again over the next 14 days.    Patient Care Plan: CCM RN- Heart Failure (Adult), HTN, PAF. CKD    Problem Identified: Symptom Exacerbation (Heart Failure), PAF, HTN   Priority: High    Long-Range Goal: Chronic disease states of HTN, HF, and PAF Symptoms and/or Exacerbations Prevented or Minimized   Start Date: 01/14/2020  Expected End Date: 01/13/2021  This Visit's Progress: On track  Priority: High  Note:   CARE PLAN ENTRY (see longitudinal plan of care for additional care plan information)  Current Barriers:  [name] Knowledge deficit related to basic heart failure, HTN, PAF pathophysiology and self care management- spoke with patient to complete follow up assessment, He continues to record blood sugar, blood pressure and weights in a log in his phone, he says most of blood sugars are <150, he does report a 3 lb weight gain overnight likely attributable to increased sodium consumption, he says he does not elevate his legs even though he knows  he should, he reports good medication taking behavior, patient is able to correctly identify the weight gain parameters that would require MD notification Nurse Case Manager Clinical Goal(s):   Over the next 30-90 days, patient will weigh self daily and record  Over the next 30-90 days patient will check blood pressure daily and record  Over the  next 30-90  days, patient will verbalize understanding of Heart Failure Action Plan and when to call doctor  Over the next 30-90 days, patient will take all mediations as prescribed  Over the next 30-90 days, patient will notify Laney Bagshaw for problems related to HTN, HF and PAF  Over the next 30-90 days patient will attend all scheduled Osmani Kersten appointments   Interventions:  . Basic overview and discussion of pathophysiology of Heart Failure, HTN, and PAF . Reiterated importance of good medication behavior and ensured patient is now taking all prescribed medications . Reviewed Heart Failure Action Plan  . Reviewed importance of daily weights . Reviewed upcoming appointment for 2 d echo on 07/17/20 and with cardiologist on 08/07/20 and ensured patient has transportation  Patient Self Care Activities:  . Take all medications as prescribed . Weigh daily and record (notify MD with 3 lb weight gain over night or 5 lb in a week) . Follow CHF Action Plan . - call office if I gain more than 2 pounds in one day or 5 pounds in one week . - do ankle pumps when sitting . - use salt in moderation . - watch for swelling in feet, ankles and legs every day . - weigh myself daily . check blood pressure daily . - write blood pressure results in my phone log     Patient Care Plan: CCM RN- Diabetes Type 2 (Adult)    Problem Identified: Glycemic Management (Diabetes, Type 2)     Long-Range Goal: Glycemic Management Optimized   Start Date: 07/20/2019  This Visit's Progress: On track  Priority: High  Note:    CARE PLAN ENTRY (see longitudinal plan of care for additional care plan information)  Objective:  Lab Results  Component Value Date   HGBA1C 9.0 (H) 03/19/2020   HGBA1C 10.1 (A) 12/12/2019   HGBA1C 12.7 (A) 07/12/2019   Lab Results  Component Value Date   MICROALBUR 11.3 (H) 04/17/2014   LDLCALC 174 (H) 12/12/2019   CREATININE 1.30 (H) 03/19/2020     Current Barriers:  [name] Knowledge  Deficits related to basic Diabetes pathophysiology and self care/management . Knowledge Deficits related to medications used for management of diabetes- spoke with patient to complete follow up assessment, he reports fasting blood sugars are usually less than 150 and he reports good medication taking behavior, denies hypoglycemia  Case Manager Clinical Goal(s):  Over the next 30-60  days, patient will demonstrate improved adherence to prescribed treatment plan for diabetes self care/management as evidenced by:  . daily monitoring and recording of CBG  . adherence to ADA/ carb modified diet . adherence to prescribed medication regimen  Interventions:  . Reviewed education with patient about basic DM disease process . Reviewed medications and assessed medication taking behavior . Ensured patient has ample supply of all prescribed medications . Advised patient, providing education and rationale, to check CBG at least daily and record, calling Rosealynn Mateus and/or CCM RN for findings outside established parameters. . Discussed plans with patient for ongoing care management follow up and provided patient with direct contact information for care management team . Review of patient status, including review of  consultants reports, relevant laboratory and other test results, and medications completed.   Patient Self Care Activities:  . UNABLE to independently self manage DM . Self administers oral medications as prescribed . Self administers insulin as prescribed . Self administers injectable DM medication Xultophy as prescribed . Attends all scheduled Cristoval Teall appointments . Checks blood sugars as prescribed and utilize hyper and hypoglycemia protocol as needed . Adheres to prescribed ADA/carb modified . check blood sugar at prescribed times . - check blood sugar if I feel it is too high or too low . - enter blood sugar readings and medication or insulin into my phone log . - take the blood sugar log  to all doctor visits . - take the blood sugar meter to all doctor visits           The patient verbalized understanding of instructions, educational materials, and care plan provided today and declined offer to receive copy of patient instructions, educational materials, and care plan.   The care management team will reach out to the patient again over the next 30-60 days.   [name] RN, CCM, [facility] RN Care Manager [phone]

## 2020-06-03 NOTE — Chronic Care Management (AMB) (Signed)
Chronic Care Management   CCM RN Visit Note  06/03/2020 Name: Terry Parks MRN: [id number] DOB: April 20, 1956  Subjective: Terry Parks is a 65 y.o. year old male who is a primary care patient of [name], MD. The care management team was consulted for assistance with disease management and care coordination needs.    Engaged with patient by telephone for follow up visit in response to provider referral for case management and/or care coordination services.   Consent to Services:  The patient was given information about Chronic Care Management services, agreed to services, and gave verbal consent prior to initiation of services.  Please see initial visit note for detailed documentation.   Patient agreed to services and verbal consent obtained.   Assessment: Review of patient past medical history, allergies, medications, health status, including review of consultants reports, laboratory and other test data, was performed as part of comprehensive evaluation and provision of chronic care management services.   SDOH (Social Determinants of Health) assessments and interventions performed:    CCM Care Plan  Allergies  Allergen Reactions  . Lisinopril Cough    Outpatient Encounter Medications as of 06/03/2020  Medication Sig  . Accu-Chek Softclix Lancets lancets Check blood sugar three times a day as instructed  . acetaminophen (TYLENOL) 325 MG tablet Take 650 mg by mouth every 6 (six) hours as needed for moderate pain.   [name] amiodarone (PACERONE) 200 MG tablet TAKE 1 TABLET BY MOUTH DAILY.  [name] atorvastatin (LIPITOR) 40 MG tablet Take 1 tablet (40 mg total) by mouth daily.  . Blood Glucose Monitoring Suppl (ACCU-CHEK AVIVA) device Use as instructed to check blood sugar up to 3 times a day  . carvedilol (COREG) 25 MG tablet TAKE 1 TABLET (25 MG TOTAL) BY MOUTH 2 (TWO) TIMES DAILY WITH A MEAL.  [name] docusate sodium (COLACE) 100 MG capsule Take 1 capsule (100 mg total) by mouth 2 (two)  times daily.  . empagliflozin (JARDIANCE) 10 MG TABS tablet Take 1 tablet (10 mg total) by mouth daily before breakfast.  . 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 insulin dependent  . ibuprofen (ADVIL) 200 MG tablet Take 800 mg by mouth every 8 (eight) hours as needed for moderate pain.  . Insulin Degludec-Liraglutide (XULTOPHY) 100-3.6 UNIT-MG/ML SOPN Inject 50 Units into the skin daily.  . Insulin Pen Needle (CARETOUCH PEN NEEDLES) 31G X 6 MM MISC 1 pen by Does not apply route at bedtime.  . metFORMIN (GLUCOPHAGE XR) 500 MG 24 hr tablet Take 4 tablets (2,000 mg total) by mouth daily with breakfast.  . nystatin (NYSTATIN) powder Apply 1 application topically 3 (three) times daily.  [name] omeprazole (PRILOSEC) 40 MG capsule TAKE 1 CAPSULE BY MOUTH TWICE DAILY BEFORE A MEAL.  [name] oxyCODONE (ROXICODONE) 5 MG immediate release tablet Take 1 tablet (5 mg total) by mouth every 6 (six) hours as needed. Alternate each dose with tylenol.  . rivaroxaban (XARELTO) 20 MG TABS tablet Take 1 tablet (20 mg total) by mouth daily with breakfast.  . sacubitril-valsartan (ENTRESTO) 97-103 MG Take 1 tablet by mouth 2 (two) times daily.  [name] torsemide (DEMADEX) 20 MG tablet Take 20 mg by mouth daily.   No facility-administered encounter medications on file as of 06/03/2020.    Patient Active Problem List   Diagnosis Date Noted  . S/P laparoscopic hernia repair 03/24/2020  . History of iron deficiency anemia 12/16/2019  . Anemia 12/16/2019  . History  of esophagitis 12/16/2019  . Ventral hernia without obstruction or gangrene 12/16/2019  . Acute on chronic HFrEF (heart failure with reduced ejection fraction) ([place]) 08/11/2019  . 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  . Depression 01/31/2019  . Right hip pain 09/19/2018  . Iron deficiency anemia 04/28/2018  . Diastasis recti 04/28/2018  . Abdominal hernia 03/10/2018  . Stage 3 chronic kidney disease 02/11/2018   . Healthcare maintenance 02/11/2018  . Physical deconditioning 02/11/2018  . Stress incontinence of urine 01/06/2018  . Pain due to dental caries 11/09/2017  . Prostate cancer ([place]) 10/06/2017  . Bladder mass 07/13/2017  . Persistent atrial fibrillation   . Vitamin D deficiency 06/22/2016  . Neuropathy in diabetes ([place]) 10/07/2015  . Erectile dysfunction 04/17/2014  . Allergic rhinitis 10/19/2012  . Diabetic retinopathy ([place]) 10/04/2012  . BPH   . Type 2 diabetes mellitus with peripheral neuropathy ([place]) 04/08/2008  . Obstructive sleep apnea   . Morbid obesity ([place])   . Hyperlipidemia associated with type 2 diabetes mellitus ([place])   . Hypertension associated with diabetes ([place])   . Chronic combined systolic and diastolic CHF (congestive heart failure) ([place]) 03/01/2006    Conditions to be addressed/monitored:IDDM, HF, HTN, PAF, CKD- stage 3, OSA, BPH, blind in r eye  Care Plan : CCM RN- Heart Failure (Adult), HTN, PAF. CKD  Updates made by Barrington Ellison, RN since 06/03/2020 12:00 AM    Problem: Symptom Exacerbation (Heart Failure), PAF, HTN   Priority: High    Long-Range Goal: Chronic disease states of HTN, HF, and PAF Symptoms and/or Exacerbations Prevented or Minimized   Start Date: 01/14/2020  Expected End Date: 01/13/2021  This Visit's Progress: On track  Priority: High  Note:   CARE PLAN ENTRY (see longitudinal plan of care for additional care plan information)  Current Barriers:  [name] Knowledge deficit related to basic heart failure, HTN, PAF pathophysiology and self care management- spoke with patient to complete follow up assessment, He continues to record blood sugar, blood pressure and weights in a log in his phone, he says most of blood sugars are <150, he does report a 3 lb weight gain overnight likely attributable to increased sodium consumption, he says he does not elevate his legs even though he knows he should, he reports good medication taking behavior, patient is  able to correctly identify the weight gain parameters that would require MD notification Nurse Case Manager Clinical Goal(s):   Over the next 30-90 days, patient will weigh self daily and record  Over the next 30-90 days patient will check blood pressure daily and record  Over the next 30-90  days, patient will verbalize understanding of Heart Failure Action Plan and when to call doctor  Over the next 30-90 days, patient will take all mediations as prescribed  Over the next 30-90 days, patient will notify provider for problems related to HTN, HF and PAF  Over the next 30-90 days patient will attend all scheduled provider appointments   Interventions:  . Basic overview and discussion of pathophysiology of Heart Failure, HTN, and PAF . Reiterated importance of good medication behavior and ensured patient is now taking all prescribed medications . Reviewed Heart Failure Action Plan  . Reviewed importance of daily weights . Reviewed upcoming appointment for 2 d echo on 07/17/20 and with cardiologist on 08/07/20 and ensured patient has transportation  Patient Self Care Activities:  . Take all medications as prescribed . Weigh daily and record (notify MD with 3 lb weight  gain over night or 5 lb in a week) . Follow CHF Action Plan . - call office if I gain more than 2 pounds in one day or 5 pounds in one week . - do ankle pumps when sitting . - use salt in moderation . - watch for swelling in feet, ankles and legs every day . - weigh myself daily . check blood pressure daily . - write blood pressure results in my phone log     Care Plan : CCM RN- Diabetes Type 2 (Adult)  Updates made by Barrington Ellison, RN since 06/03/2020 12:00 AM    Problem: Glycemic Management (Diabetes, Type 2)     Long-Range Goal: Glycemic Management Optimized   Start Date: 07/20/2019  This Visit's Progress: On track  Priority: High  Note:    CARE PLAN ENTRY (see longitudinal plan of care for additional care  plan information)  Objective:  Lab Results  Component Value Date   HGBA1C 9.0 (H) 03/19/2020   HGBA1C 10.1 (A) 12/12/2019   HGBA1C 12.7 (A) 07/12/2019   Lab Results  Component Value Date   MICROALBUR 11.3 (H) 04/17/2014   LDLCALC 174 (H) 12/12/2019   CREATININE 1.30 (H) 03/19/2020     Current Barriers:  [name] Knowledge Deficits related to basic Diabetes pathophysiology and self care/management . Knowledge Deficits related to medications used for management of diabetes- spoke with patient to complete follow up assessment, he reports fasting blood sugars are usually less than 150 and he reports good medication taking behavior, denies hypoglycemia  Case Manager Clinical Goal(s):  Over the next 30-60  days, patient will demonstrate improved adherence to prescribed treatment plan for diabetes self care/management as evidenced by:  . daily monitoring and recording of CBG  . adherence to ADA/ carb modified diet . adherence to prescribed medication regimen  Interventions:  . Reviewed education with patient about basic DM disease process . Reviewed medications and assessed medication taking behavior . Ensured patient has ample supply of all prescribed medications . Advised patient, providing education and rationale, to check CBG at least daily and record, calling provider and/or CCM RN for findings outside established parameters. . Discussed plans with patient for ongoing care management follow up and provided patient with direct contact information for care management team . Review of patient status, including review of consultants reports, relevant laboratory and other test results, and medications completed.   Patient Self Care Activities:  . UNABLE to independently self manage DM . Self administers oral medications as prescribed . Self administers insulin as prescribed . Self administers injectable DM medication Xultophy as prescribed . Attends all scheduled provider  appointments . Checks blood sugars as prescribed and utilize hyper and hypoglycemia protocol as needed . Adheres to prescribed ADA/carb modified . check blood sugar at prescribed times . - check blood sugar if I feel it is too high or too low . - enter blood sugar readings and medication or insulin into my phone log . - take the blood sugar log to all doctor visits . - take the blood sugar meter to all doctor visits           Plan:The care management team will reach out to the patient again over the next 30-60 days.    [name] RN, CCM, [facility] RN Care Manager [phone]

## 2020-06-04 ENCOUNTER — Other Ambulatory Visit: Payer: Self-pay

## 2020-06-04 ENCOUNTER — Other Ambulatory Visit: Payer: Self-pay | Admitting: Internal Medicine

## 2020-06-04 DIAGNOSIS — I4819 Other persistent atrial fibrillation: Secondary | ICD-10-CM

## 2020-06-04 MED ORDER — AMIODARONE HCL 200 MG PO TABS: 200.0000 mg | ORAL_TABLET | Freq: Every day | ORAL | 1 refills | Status: DC

## 2020-06-04 MED FILL — AMIODARONE HCL 200 MG TAB: 200 | 30 days supply | Qty: 30 | Fill #0

## 2020-06-05 ENCOUNTER — Other Ambulatory Visit: Payer: Self-pay

## 2020-06-09 ENCOUNTER — Ambulatory Visit: Payer: Medicaid Other | Admitting: *Deleted

## 2020-06-09 DIAGNOSIS — E1159 Type 2 diabetes mellitus with other circulatory complications: Secondary | ICD-10-CM

## 2020-06-09 DIAGNOSIS — N393 Stress incontinence (female) (male): Secondary | ICD-10-CM

## 2020-06-09 DIAGNOSIS — E1142 Type 2 diabetes mellitus with diabetic polyneuropathy: Secondary | ICD-10-CM

## 2020-06-09 DIAGNOSIS — I152 Hypertension secondary to endocrine disorders: Secondary | ICD-10-CM

## 2020-06-09 DIAGNOSIS — I4819 Other persistent atrial fibrillation: Secondary | ICD-10-CM

## 2020-06-09 NOTE — Chronic Care Management (AMB) (Signed)
   06/09/2020  Markis Langland 1955/11/27 [id number]  Received renewal Rx form from [employer] for patient's  urinary incontinence supplies. Form placed in [facility] Team's box for Dr Youlanda Mighty signature since she saw patient on 12/12/19 and addressed urinary incontinence. [name] RN, CCM, [facility] RN Care Manager [phone]

## 2020-06-10 ENCOUNTER — Ambulatory Visit: Payer: Medicaid Other | Admitting: *Deleted

## 2020-06-10 DIAGNOSIS — E785 Hyperlipidemia, unspecified: Secondary | ICD-10-CM

## 2020-06-10 DIAGNOSIS — E1159 Type 2 diabetes mellitus with other circulatory complications: Secondary | ICD-10-CM

## 2020-06-10 DIAGNOSIS — G4733 Obstructive sleep apnea (adult) (pediatric): Secondary | ICD-10-CM

## 2020-06-10 DIAGNOSIS — E1169 Type 2 diabetes mellitus with other specified complication: Secondary | ICD-10-CM

## 2020-06-10 NOTE — Chronic Care Management (AMB) (Signed)
   06/10/2020  Emery Dupuy 12/20/1955 [id number]  Successful fax to [employer] representative Alba Cory at fax # [phone] of renewal order for incontinence supplies .  [name] RN, CCM, [facility] RN Care Manager [phone]

## 2020-06-12 NOTE — Progress Notes (Signed)
Internal Medicine Clinic Resident  I have personally reviewed this encounter including the documentation in this note and/or discussed this patient with the care management provider. I will address any urgent items identified by the care management provider and will communicate my actions to the patient's PCP. I have reviewed the patient's CCM visit with my supervising attending, Dr Mullen.  Roran Wegner, MD  [facility] PGY-2 06/12/2020    

## 2020-06-13 ENCOUNTER — Telehealth: Payer: Self-pay

## 2020-06-13 MED FILL — ENTRESTO 97 MG-103 MG TAB: 97-103 | 30 days supply | Qty: 60 | Fill #1

## 2020-06-13 NOTE — Telephone Encounter (Signed)
**Note De-Identified Natonya Finstad Obfuscation** I started a Entresto PA through [employer]; Key: [id number]

## 2020-06-13 NOTE — Telephone Encounter (Signed)
**Note De-Identified Lyndee Herbst Obfuscation** This message received from Covermymeds: [name] KeyWinfield Cunas - PA Case ID: [id number] Outcome: Approved Coverage Starts on: 06/13/2020 12:00:00 AM, Coverage Ends on: 06/13/2021 12:00:00 AM. Drug: [name] 97-103MG  tablets Form: [employer] [facility] Electronic PA Form [phone] NCPDP)  I have notified [place] of this approval.

## 2020-06-17 ENCOUNTER — Telehealth: Payer: Self-pay | Admitting: *Deleted

## 2020-06-17 MED FILL — JARDIANCE 10 MG TABLET: 10 | 30 days supply | Qty: 30 | Fill #1

## 2020-06-17 NOTE — Telephone Encounter (Signed)
Information was sent through CoverMyMeds for PA for Jardiance 10 mg.  Approved 06/17/2020 through 06/17/2021. 12:00 AM. Sander Nephew, RN 06/17/2020 3:28 PM.

## 2020-06-18 NOTE — Progress Notes (Signed)
Internal Medicine Clinic Attending  CCM services provided by the care management provider and their documentation were discussed with Dr. Marva Panda. We reviewed the pertinent findings, urgent action items addressed by the resident and non-urgent items to be addressed by the PCP.  I agree with the assessment, diagnosis, and plan of care documented in the CCM and resident's note.  Gilles Chiquito, MD 06/18/2020

## 2020-06-27 ENCOUNTER — Other Ambulatory Visit: Payer: Self-pay | Admitting: Cardiovascular Disease

## 2020-06-27 MED FILL — JARDIANCE 10 MG TABLET: 10 | 30 days supply | Qty: 30 | Fill #1

## 2020-06-27 MED FILL — OMEPRAZOLE 40 MG CPDR: 40 | 30 days supply | Qty: 60 | Fill #1

## 2020-07-04 ENCOUNTER — Other Ambulatory Visit: Payer: Self-pay | Admitting: Cardiovascular Disease

## 2020-07-04 ENCOUNTER — Other Ambulatory Visit: Payer: Self-pay

## 2020-07-04 ENCOUNTER — Telehealth: Payer: Medicaid Other

## 2020-07-04 ENCOUNTER — Other Ambulatory Visit: Payer: Self-pay | Admitting: Internal Medicine

## 2020-07-04 ENCOUNTER — Telehealth: Payer: Self-pay | Admitting: Internal Medicine

## 2020-07-04 DIAGNOSIS — I5042 Chronic combined systolic (congestive) and diastolic (congestive) heart failure: Secondary | ICD-10-CM

## 2020-07-04 MED ORDER — TORSEMIDE 20 MG PO TABS: 40.0000 mg | ORAL_TABLET | Freq: Every day | ORAL | 5 refills | Status: DC

## 2020-07-04 MED ORDER — TORSEMIDE 20 MG PO TABS: 20.0000 mg | ORAL_TABLET | Freq: Every day | ORAL | 1 refills | Status: DC

## 2020-07-04 MED FILL — TORSEMIDE 20 MG TABLET: 20 | 30 days supply | Qty: 60 | Fill #0

## 2020-07-04 NOTE — Telephone Encounter (Signed)
Patient requesting refill of his torsemide and reports he has been out of medication for 2 weeks.  On review of chart patient recently was noted to be on furosemide and torsemide by his cardiologist.  His cardiologist contacted chronic care management team and notes show patient was on 20 mg of torsemide.  Patient says he takes 40 mg daily.  Refilled torsemide 40 mg daily.  It has been a few months since patient has had lab work and I will place order for patient to come by for check next week. He reports he is close to his recent dry weight note in chart ~308.

## 2020-07-04 NOTE — Telephone Encounter (Signed)
Returned call to patient. Requesting refill on torsemide at [facility]. States he's been out for 2 weeks. Hubbard Hartshorn, BSN, RN-BC

## 2020-07-04 NOTE — Telephone Encounter (Signed)
pls contact pt regarding medicine [phone]

## 2020-07-08 ENCOUNTER — Ambulatory Visit: Payer: Medicaid Other | Admitting: *Deleted

## 2020-07-08 DIAGNOSIS — I5042 Chronic combined systolic (congestive) and diastolic (congestive) heart failure: Secondary | ICD-10-CM

## 2020-07-08 DIAGNOSIS — E785 Hyperlipidemia, unspecified: Secondary | ICD-10-CM

## 2020-07-08 DIAGNOSIS — N393 Stress incontinence (female) (male): Secondary | ICD-10-CM

## 2020-07-08 DIAGNOSIS — E1159 Type 2 diabetes mellitus with other circulatory complications: Secondary | ICD-10-CM

## 2020-07-08 DIAGNOSIS — E1142 Type 2 diabetes mellitus with diabetic polyneuropathy: Secondary | ICD-10-CM

## 2020-07-08 DIAGNOSIS — I4819 Other persistent atrial fibrillation: Secondary | ICD-10-CM

## 2020-07-08 DIAGNOSIS — G4733 Obstructive sleep apnea (adult) (pediatric): Secondary | ICD-10-CM

## 2020-07-08 DIAGNOSIS — E1169 Type 2 diabetes mellitus with other specified complication: Secondary | ICD-10-CM

## 2020-07-08 DIAGNOSIS — I152 Hypertension secondary to endocrine disorders: Secondary | ICD-10-CM

## 2020-07-08 NOTE — Chronic Care Management (AMB) (Signed)
   07/08/2020  Terry Parks 1955/10/31 [id number]  Returned call to patient after he left voice mail earlier today. Patient states he was called by the nurse supervisor. Ms Oletta Lamas, of the agency that provides his personal care service [facility]) home aides, [facility], [phone], and Ms edwards told patient his PCS aides can only massage his legs 2 1/2  minutes maximum because massaging them longer than that could worsen his neuropathy. Patient is requesting his primary care provider advise agency if this statement is accurate. If it is not,  he is requesting his PCP call Ms. Edwards or write a letter indicating his PCS aides will not cause harm as the massages the aides provide is the only therapy that significantly relieves his bilateral neuropathic leg and foot pain.   [name] RN, CCM, [facility] RN Care Manager [phone]

## 2020-07-09 NOTE — Progress Notes (Signed)
Internal Medicine Clinic Attending  CCM services provided by the care management provider and their documentation were discussed with Dr. Steen. We reviewed the pertinent findings, urgent action items addressed by the resident and non-urgent items to be addressed by the PCP.  I agree with the assessment, diagnosis, and plan of care documented in the CCM and resident's note.  Jamariah Tony, MD 07/09/2020  

## 2020-07-09 NOTE — Progress Notes (Signed)
Internal Medicine Clinic Resident  I left a message for Ms.Edwards to discuss patients concern.    I have personally reviewed this encounter including the documentation in this note and/or discussed this patient with the care management provider. I will address any urgent items identified by the care management provider and will communicate my actions to the patient's PCP. I have reviewed the patient's CCM visit with my supervising attending, Dr Daryll Drown.  Lorene Dy, MD 07/09/2020

## 2020-07-10 MED FILL — XULTOPHY 100 UNIT-3.6MG/ML: 100-3.6 | 12 days supply | Qty: 6 | Fill #3

## 2020-07-15 ENCOUNTER — Telehealth: Payer: Self-pay | Admitting: Internal Medicine

## 2020-07-15 ENCOUNTER — Ambulatory Visit: Payer: Medicaid Other | Admitting: *Deleted

## 2020-07-15 DIAGNOSIS — E1169 Type 2 diabetes mellitus with other specified complication: Secondary | ICD-10-CM

## 2020-07-15 DIAGNOSIS — E785 Hyperlipidemia, unspecified: Secondary | ICD-10-CM

## 2020-07-15 DIAGNOSIS — I5042 Chronic combined systolic (congestive) and diastolic (congestive) heart failure: Secondary | ICD-10-CM

## 2020-07-15 DIAGNOSIS — I4819 Other persistent atrial fibrillation: Secondary | ICD-10-CM

## 2020-07-15 DIAGNOSIS — E1159 Type 2 diabetes mellitus with other circulatory complications: Secondary | ICD-10-CM

## 2020-07-15 DIAGNOSIS — G4733 Obstructive sleep apnea (adult) (pediatric): Secondary | ICD-10-CM

## 2020-07-15 DIAGNOSIS — I152 Hypertension secondary to endocrine disorders: Secondary | ICD-10-CM

## 2020-07-15 NOTE — Telephone Encounter (Signed)
Spoke with Terry Parks regarding clarification of PCS services. Discussed that PCS responsibilities do not include leg massages and he should make an appointment to further evaluate his leg pain. Mr.Harshman expressed understanding and requests appointment Friday morning. Will forward to front desk to arrange appointment.

## 2020-07-15 NOTE — Progress Notes (Signed)
Internal Medicine Clinic Resident  Spoke with PCS agency and clarified MASSAGE THERAPY IS NOT RESPONSIBILITY OF THE PCS team. Suspect patient had poor understanding of duties and responsibilities of PCS services.  I have personally reviewed this encounter including the documentation in this note and/or discussed this patient with the care management provider. I will address any urgent items identified by the care management provider and will communicate my actions to the patient's PCP. I have reviewed the patient's CCM visit with my supervising attending, Dr Philipp Ovens.  Mosetta Anis, MD 07/15/2020

## 2020-07-15 NOTE — Chronic Care Management (AMB) (Signed)
Care Management    RN Visit Note  07/15/2020 Name: Terry Parks MRN: [id number] DOB: 04/18/1956  Subjective: Terry Parks is a 65 y.o. year old male who is a primary care patient of [name], MD. The care management team was consulted for assistance with disease management and care coordination needs.    Engaged with patient by telephone for follow up visit in response to provider referral for case management and/or care coordination services.   Consent to Services:   Terry Parks was given information about Care Management services today including:  1. Care Management services includes personalized support from designated clinical staff supervised by his physician, including individualized plan of care and coordination with other care providers 2. 24/7 contact phone numbers for assistance for urgent and routine care needs. 3. The patient may stop case management services at any time by phone call to the office staff.  Patient agreed to services and consent obtained.   Assessment: Review of patient past medical history, allergies, medications, health status, including review of consultants reports, laboratory and other test data, was performed as part of comprehensive evaluation and provision of chronic care management services.   SDOH (Social Determinants of Health) assessments and interventions performed:    Care Plan  Allergies  Allergen Reactions  . Lisinopril Cough    Outpatient Encounter Medications as of 07/15/2020  Medication Sig  . Accu-Chek Softclix Lancets lancets Check blood sugar three times a day as instructed  . acetaminophen (TYLENOL) 325 MG tablet Take 650 mg by mouth every 6 (six) hours as needed for moderate pain.   [name] amiodarone (PACERONE) 200 MG tablet Take 1 tablet (200 mg total) by mouth daily.  [name] atorvastatin (LIPITOR) 40 MG tablet Take 1 tablet (40 mg total) by mouth daily.  . carvedilol (COREG) 25 MG tablet TAKE 1 TABLET (25 MG TOTAL) BY MOUTH  2 (TWO) TIMES DAILY WITH A MEAL.  [name] empagliflozin (JARDIANCE) 10 MG TABS tablet Take 1 tablet (10 mg total) by mouth daily before breakfast.  . 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 insulin dependent  . ibuprofen (ADVIL) 200 MG tablet Take 800 mg by mouth every 8 (eight) hours as needed for moderate pain.  . Insulin Degludec-Liraglutide (XULTOPHY) 100-3.6 UNIT-MG/ML SOPN Inject 50 Units into the skin daily.  . Insulin Pen Needle (CARETOUCH PEN NEEDLES) 31G X 6 MM MISC 1 pen by Does not apply route at bedtime.  . metFORMIN (GLUCOPHAGE XR) 500 MG 24 hr tablet Take 4 tablets (2,000 mg total) by mouth daily with breakfast.  . nystatin (NYSTATIN) powder Apply 1 application topically 3 (three) times daily.  [name] omeprazole (PRILOSEC) 40 MG capsule TAKE 1 CAPSULE BY MOUTH TWICE DAILY BEFORE A MEAL.  [name] oxyCODONE (ROXICODONE) 5 MG immediate release tablet Take 1 tablet (5 mg total) by mouth every 6 (six) hours as needed. Alternate each dose with tylenol.  . rivaroxaban (XARELTO) 20 MG TABS tablet Take 1 tablet (20 mg total) by mouth daily with breakfast.  . sacubitril-valsartan (ENTRESTO) 97-103 MG Take 1 tablet by mouth 2 (two) times daily.  [name] torsemide (DEMADEX) 20 MG tablet Take 2 tablets (40 mg total) by mouth daily.   No facility-administered encounter medications on file as of 07/15/2020.    Patient Active Problem List   Diagnosis Date Noted  . S/P laparoscopic hernia repair 03/24/2020  . History of iron deficiency anemia 12/16/2019  . Anemia 12/16/2019  . History  of esophagitis 12/16/2019  . Ventral hernia without obstruction or gangrene 12/16/2019  . Acute on chronic HFrEF (heart failure with reduced ejection fraction) ([place]) 08/11/2019  . 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  . Depression 01/31/2019  . Right hip pain 09/19/2018  . Iron deficiency anemia 04/28/2018  . Diastasis recti 04/28/2018  . Abdominal hernia 03/10/2018  .  Stage 3 chronic kidney disease 02/11/2018  . Healthcare maintenance 02/11/2018  . Physical deconditioning 02/11/2018  . Stress incontinence of urine 01/06/2018  . Pain due to dental caries 11/09/2017  . Prostate cancer ([place]) 10/06/2017  . Bladder mass 07/13/2017  . Persistent atrial fibrillation   . Vitamin D deficiency 06/22/2016  . Neuropathy in diabetes ([place]) 10/07/2015  . Erectile dysfunction 04/17/2014  . Allergic rhinitis 10/19/2012  . Diabetic retinopathy ([place]) 10/04/2012  . BPH   . Type 2 diabetes mellitus with peripheral neuropathy ([place]) 04/08/2008  . Obstructive sleep apnea   . Morbid obesity ([place])   . Hyperlipidemia associated with type 2 diabetes mellitus ([place])   . Hypertension associated with diabetes ([place])   . Chronic combined systolic and diastolic CHF (congestive heart failure) ([place]) 03/01/2006    Conditions to be addressed/monitored: IDDM, HF, HTN, PAF, CKD- stage 3, OSA, BPH, blind in r eye, chronic pain lower extremeties, obesity  Care Plan : CCM RN- Heart Failure (Adult), HTN, PAF. CKD  Updates made by Terry Ellison, RN since 07/15/2020 12:00 AM    Problem: Symptom Exacerbation (Heart Failure), PAF, HTN   Priority: High    Long-Range Goal: Chronic disease states of HTN, HF, and PAF Symptoms and/or Exacerbations Prevented or Minimized   Start Date: 01/14/2020  Expected End Date: 01/13/2021  Recent Progress: On track  Priority: High  Note:   CARE PLAN ENTRY (see longitudinal plan of care for additional care plan information)  Current Barriers:  [name] Knowledge deficit related to basic heart failure, HTN, PAF pathophysiology and self care management- spoke with patient to complete follow up assessment, He continues to record blood sugar, blood pressure and weights in a log in his phone, he says most of blood sugars are <150, he does reports his current weight as 299 lbs,  he says he does not elevate his legs even though he knows he should, he reports good medication  taking behavior, patient is able to correctly identify the weight gain parameters that would require MD notification Nurse Case Manager Clinical Goal(s):   Over the next 30-90 days, patient will weigh self daily and record  Over the next 30-90 days patient will check blood pressure daily and record  Over the next 30-90  days, patient will verbalize understanding of Heart Failure Action Plan and when to call doctor  Over the next 30-90 days, patient will take all mediations as prescribed  Over the next 30-90 days, patient will notify provider for problems related to HTN, HF and PAF  Over the next 30-90 days patient will attend all scheduled provider appointments   Interventions:  . Basic overview and discussion of pathophysiology of Heart Failure, HTN, and PAF . Reiterated importance of good medication behavior and ensured patient is now taking all prescribed medications . Reviewed Heart Failure Action Plan  . Reviewed importance of daily weights . Reviewed upcoming appointment for 2 d echo on 07/17/20 and with cardiologist on 08/07/20 and ensured patient has transportation  Patient Self Care Activities:  . Take all medications as prescribed . Weigh daily and record (notify MD with 3 lb weight  gain over night or 5 lb in a week) . Follow CHF Action Plan . - call office if I gain more than 2 pounds in one day or 5 pounds in one week . - do ankle pumps when sitting . - use salt in moderation . - watch for swelling in feet, ankles and legs every day . - weigh myself daily . check blood pressure daily . - write blood pressure results in my phone log     Care Plan : CCM RN- Diabetes Type 2 (Adult)  Updates made by Terry Ellison, RN since 07/15/2020 12:00 AM    Problem: Glycemic Management (Diabetes, Type 2)     Long-Range Goal: Glycemic Management Optimized   Start Date: 07/20/2019  Recent Progress: On track  Priority: High  Note:    CARE PLAN ENTRY (see longitudinal plan of care  for additional care plan information)  Objective:  Lab Results  Component Value Date   HGBA1C 9.0 (H) 03/19/2020   HGBA1C 10.1 (A) 12/12/2019   HGBA1C 12.7 (A) 07/12/2019   Lab Results  Component Value Date   MICROALBUR 11.3 (H) 04/17/2014   LDLCALC 174 (H) 12/12/2019   CREATININE 1.30 (H) 03/19/2020     Current Barriers:  [name] Knowledge Deficits related to basic Diabetes pathophysiology and self care/management . Knowledge Deficits related to medications used for management of diabetes- spoke with patient to complete follow up assessment, he reports fasting blood sugars are usually less than 150 but have been higher recently because he has been drinking sweetened soda,  he reports good medication taking behavior, denies hypoglycemia  Case Manager Clinical Goal(s):  Over the next 30-60  days, patient will demonstrate improved adherence to prescribed treatment plan for diabetes self care/management as evidenced by:  . daily monitoring and recording of CBG  . adherence to ADA/ carb modified diet . adherence to prescribed medication regimen  Interventions:  . Reviewed education with patient about basic DM disease process . Reviewed medications and assessed medication taking behavior . Ensured patient has ample supply of all prescribed medications . Advised patient, providing education and rationale, to check CBG at least daily and record, calling provider and/or CCM RN for findings outside established parameters. . Requested patient observe if there is a  correlation in increased LE pain when his blood sugars are running higher . Discussed plans with patient for ongoing care management follow up and provided patient with direct contact information for care management team . Review of patient status, including review of consultants reports, relevant laboratory and other test results, and medications completed.   Patient Self Care Activities:  . UNABLE to independently self manage  DM . Self administers oral medications as prescribed . Self administers insulin as prescribed . Self administers injectable DM medication Xultophy as prescribed . Attends all scheduled provider appointments . Checks blood sugars as prescribed and utilize hyper and hypoglycemia protocol as needed . Adheres to prescribed ADA/carb modified . check blood sugar at prescribed times . - check blood sugar if I feel it is too high or too low . - enter blood sugar readings and medication or insulin into my phone log . - take the blood sugar log to all doctor visits . - take the blood sugar meter to all doctor visits         Care Plan : CCM RN- Chronic Pain (Adult)- lower extremeties  Updates made by Terry Ellison, RN since 07/15/2020 12:00 AM    Problem: Chronic Pain  Management (Chronic Pain)     Long-Range Goal: Chronic Pain Managed   Start Date: 07/15/2020  Expected End Date: 07/15/2021  This Visit's Progress: On track  Priority: High  Note:   Current Barriers:  [name] Knowledge Deficits related to self-health management of chronic lower extremity pain . Chronic Disease Management support and education needs related to chronic pain . Care Coordination needs related to allowing massage therapy in a patient with chronic lower extremity pain . Unable to independently manage chronic LE pain- patient states the pain in his legs is unbearable at times despite adherence with medication, only therapy that truly helps is massage but his  PCS aides have been told by their nurse supervisor that they can only massage his legs for 2.5 minutes to prevent further neuropathic damage per their nurse supervisor, patient is asking again for his clinic provider to reach out to [facility] service provider nurse supervisor , Elicia Lamp not Ms Oletta Lamas,  as this was incorrect name initially provided by patient, to clarify if massage therapy for a longer period of time is OK Clinical Goal(s):  . patient will  verbalize understanding of plan for pain management.  and patient will use pharmacological and nonpharmacological pain relief strategies as prescribed.  Interventions:  . Collaboration with [name], MD regarding development and update of comprehensive plan of care as evidenced by provider attestation and co-signature . Messaged provide with patient's request to call PCS agency's nurse aide supervisor, Elicia Lamp RN, at [phone],  to allow massage therapy by [facility] aides since this provide patient with significant relief of LE pain . Pain assessment performed . Medications reviewed . Discussed plans with patient for ongoing care management follow up and provided patient with direct contact information for care management team . Advised patient to see if there is a correlation in increased pain when blood sugars are higher Patient Goals/Self Care Activities:  . Will self-administer medications as prescribed . Will attend all scheduled provider appointments . Will call pharmacy for medication refills 7 days prior to needed refill date . Patient will calls provider office for new concerns or questions Follow Up Plan: The care management team will reach out to the patient again over the next 30-60 days.        Plan: The care management team will reach out to the patient again over the next 30-60 days.  [name] RN, CCM, [facility] RN Care Manager [phone]

## 2020-07-15 NOTE — Patient Instructions (Signed)
Visit Information It was nice speaking with you today. Patient Care Plan: RN Care Manager  Completed 06/03/2020  Problem Identified: Inquinal Hernia Repair TOC. Resolved 06/03/2020  Priority: High  Note:   . Chronic Disease Management support, education, and care coordination needs related to IP event on 11//8/21-11/9/21 for Inguinal Hernia Repair  . Clinical Goal(s) related to Inguinal Hernia Repair   Over the next 14 days, patient will:  . Work with the care management team to address educational, disease management, and care coordination needs  . Call provider office for new or worsened signs and symptoms  . Call care management team with questions or concerns . Verbalize basic understanding of patient centered plan of care established today  Interventions related to Inguinal Hernia Repair:  . Evaluation of current treatment plans and patient's adherence to plan as established by provider . Assessed patient understanding of disease states- patient asked about the band that he is wearing if he can take it off.  I advised him to call the sugery center for them to advise him what would be appropriate. . Assessed patient's education and care coordination needs . Provided disease specific education to patient  . Collaborated with appropriate clinical care team members regarding patient needs . Medications reviewed-patient states that he did not pick up the pain medication because he does not need it.  He is not in pain he is just sore.  He did not get the colace or torsemide because he has some at home. He states that he has had a bowel movement since being at home and feeling well.  He is staying hydrated and eating.  He is not eating as much as he usually does because he doesn't have that much of an appetite.  Dicussed Diet and the protein in his diet for the healing process. He verbalized understanding. . Scheduled Appointments Reviewed: Patient know that he has an appointment with Kathyrn Drown NP 04/01/20 at 96 am [address] [place] and Dr Sallyanne Kuster. 04/23/20 925 am [place].  He states that he has transportation to the appointments.  . Patient Goals/Self Care Activities related to Inguinal Hernia Repair .  Patient verbalizes understanding of plan .  Self-administers medications as prescribed . Calls pharmacy for medication refills . Call's provider office for new concerns or questions . Call Surgeons' office concerning question regarding his belly band.  Follow up Plan: The care management team will reach out to the patient again over the next 14 days.    Patient Care Plan: CCM RN- Heart Failure (Adult), HTN, PAF. CKD    Problem Identified: Symptom Exacerbation (Heart Failure), PAF, HTN   Priority: High    Long-Range Goal: Chronic disease states of HTN, HF, and PAF Symptoms and/or Exacerbations Prevented or Minimized   Start Date: 01/14/2020  Expected End Date: 01/13/2021  Recent Progress: On track  Priority: High  Note:   CARE PLAN ENTRY (see longitudinal plan of care for additional care plan information)  Current Barriers:  [name] Knowledge deficit related to basic heart failure, HTN, PAF pathophysiology and self care management- spoke with patient to complete follow up assessment, He continues to record blood sugar, blood pressure and weights in a log in his phone, he says most of blood sugars are <150, he does reports his current weight as 299 lbs,  he says he does not elevate his legs even though he knows he should, he reports good medication taking  behavior, patient is able to correctly identify the weight gain parameters that would require MD notification Nurse Case Manager Clinical Goal(s):   Over the next 30-90 days, patient will weigh self daily and record  Over the next 30-90 days patient will check blood pressure daily and record  Over the next 30-90  days, patient will verbalize understanding of  Heart Failure Action Plan and when to call doctor  Over the next 30-90 days, patient will take all mediations as prescribed  Over the next 30-90 days, patient will notify provider for problems related to HTN, HF and PAF  Over the next 30-90 days patient will attend all scheduled provider appointments   Interventions:  . Basic overview and discussion of pathophysiology of Heart Failure, HTN, and PAF . Reiterated importance of good medication behavior and ensured patient is now taking all prescribed medications . Reviewed Heart Failure Action Plan  . Reviewed importance of daily weights . Reviewed upcoming appointment for 2 d echo on 07/17/20 and with cardiologist on 08/07/20 and ensured patient has transportation  Patient Self Care Activities:  . Take all medications as prescribed . Weigh daily and record (notify MD with 3 lb weight gain over night or 5 lb in a week) . Follow CHF Action Plan . - call office if I gain more than 2 pounds in one day or 5 pounds in one week . - do ankle pumps when sitting . - use salt in moderation . - watch for swelling in feet, ankles and legs every day . - weigh myself daily . check blood pressure daily . - write blood pressure results in my phone log     Patient Care Plan: CCM RN- Diabetes Type 2 (Adult)    Problem Identified: Glycemic Management (Diabetes, Type 2)     Long-Range Goal: Glycemic Management Optimized   Start Date: 07/20/2019  Recent Progress: On track  Priority: High  Note:    CARE PLAN ENTRY (see longitudinal plan of care for additional care plan information)  Objective:  Lab Results  Component Value Date   HGBA1C 9.0 (H) 03/19/2020   HGBA1C 10.1 (A) 12/12/2019   HGBA1C 12.7 (A) 07/12/2019   Lab Results  Component Value Date   MICROALBUR 11.3 (H) 04/17/2014   LDLCALC 174 (H) 12/12/2019   CREATININE 1.30 (H) 03/19/2020     Current Barriers:  [name] Knowledge Deficits related to basic Diabetes pathophysiology and self  care/management . Knowledge Deficits related to medications used for management of diabetes- spoke with patient to complete follow up assessment, he reports fasting blood sugars are usually less than 150 but have been higher recently because he has been drinking sweetened soda,  he reports good medication taking behavior, denies hypoglycemia  Case Manager Clinical Goal(s):  Over the next 30-60  days, patient will demonstrate improved adherence to prescribed treatment plan for diabetes self care/management as evidenced by:  . daily monitoring and recording of CBG  . adherence to ADA/ carb modified diet . adherence to prescribed medication regimen  Interventions:  . Reviewed education with patient about basic DM disease process . Reviewed medications and assessed medication taking behavior . Ensured patient has ample supply of all prescribed medications . Advised patient, providing education and rationale, to check CBG at least daily and record, calling provider and/or CCM RN for findings outside established parameters. . Requested patient observe if there is a  correlation in increased LE pain when his blood sugars are running higher . Discussed plans with patient  for ongoing care management follow up and provided patient with direct contact information for care management team . Review of patient status, including review of consultants reports, relevant laboratory and other test results, and medications completed.   Patient Self Care Activities:  . UNABLE to independently self manage DM . Self administers oral medications as prescribed . Self administers insulin as prescribed . Self administers injectable DM medication Xultophy as prescribed . Attends all scheduled provider appointments . Checks blood sugars as prescribed and utilize hyper and hypoglycemia protocol as needed . Adheres to prescribed ADA/carb modified . check blood sugar at prescribed times . - check blood sugar if I feel  it is too high or too low . - enter blood sugar readings and medication or insulin into my phone log . - take the blood sugar log to all doctor visits . - take the blood sugar meter to all doctor visits         Patient Care Plan: CCM RN- Chronic Pain (Adult)- lower extremeties    Problem Identified: Chronic Pain Management (Chronic Pain)     Long-Range Goal: Chronic Pain Managed   Start Date: 07/15/2020  Expected End Date: 07/15/2021  This Visit's Progress: On track  Priority: High  Note:   Current Barriers:  [name] Knowledge Deficits related to self-health management of chronic lower extremity pain . Chronic Disease Management support and education needs related to chronic pain . Care Coordination needs related to allowing massage therapy in a patient with chronic lower extremity pain . Unable to independently manage chronic LE pain- patient states the pain in his legs is unbearable at times despite adherence with medication, only therapy that truly helps is massage but his  PCS aides have been told by their nurse supervisor that they can only massage hi legs for 2.5 minutes to prevent further neuropathic damage, patient is asking again for his clinic provider to reach out to [facility] service provider nurse supervisor , Elicia Lamp not Ms Oletta Lamas,  as this was incorrect name initially provided by patient, to clarify if massage therapy for a longer period of time is OK Clinical Goal(s):  . patient will verbalize understanding of plan for pain management.  and patient will use pharmacological and nonpharmacological pain relief strategies as prescribed.  Interventions:  . Collaboration with [name], MD regarding development and update of comprehensive plan of care as evidenced by provider attestation and co-signature . Messaged provide with patient's request to call PCS agency' s nurse, Elicia Lamp,  to allow massage therapy by [facility] aides since this provide patient with  significant relief of LE pain . Pain assessment performed . Medications reviewed . Discussed plans with patient for ongoing care management follow up and provided patient with direct contact information for care management team . Advised patient to see if there is a correlation in increased pain when blood sugars are higher Patient Goals/Self Care Activities:  . Will self-administer medications as prescribed . Will attend all scheduled provider appointments . Will call pharmacy for medication refills 7 days prior to needed refill date . Patient will calls provider office for new concerns or questions Follow Up Plan: The care management team will reach out to the patient again over the next 30-60 days.        The patient verbalized understanding of instructions, educational materials, and care plan provided today and declined offer to receive copy of patient instructions, educational materials, and care plan.   The care management team  will reach out to the patient again over the next 30-60 days.   [name] RN, CCM, [facility] RN Care Manager [phone]

## 2020-07-16 NOTE — Progress Notes (Signed)
Internal Medicine Clinic Attending  CCM services provided by the care management provider and their documentation were discussed with Dr. Lee. We reviewed the pertinent findings, urgent action items addressed by the resident and non-urgent items to be addressed by the PCP.  I agree with the assessment, diagnosis, and plan of care documented in the CCM and resident's note.  Kimber Fritts, MD 07/16/2020  

## 2020-07-17 ENCOUNTER — Encounter (HOSPITAL_COMMUNITY): Payer: Self-pay | Admitting: Cardiovascular Disease

## 2020-07-17 ENCOUNTER — Encounter (HOSPITAL_COMMUNITY): Payer: Self-pay

## 2020-07-17 ENCOUNTER — Other Ambulatory Visit (HOSPITAL_COMMUNITY): Payer: Medicaid Other

## 2020-07-17 NOTE — Progress Notes (Signed)
Verified appointment "no show" status with AEulas Post at 09:28.

## 2020-07-18 ENCOUNTER — Encounter: Payer: Self-pay | Admitting: Internal Medicine

## 2020-07-18 ENCOUNTER — Other Ambulatory Visit: Payer: Self-pay | Admitting: Internal Medicine

## 2020-07-18 ENCOUNTER — Ambulatory Visit (INDEPENDENT_AMBULATORY_CARE_PROVIDER_SITE_OTHER): Payer: Medicaid Other | Admitting: Internal Medicine

## 2020-07-18 VITALS — BP 115/69 | HR 84 | Temp 97.6°F | Ht 73.0 in | Wt 309.6 lb

## 2020-07-18 DIAGNOSIS — N183 Chronic kidney disease, stage 3 unspecified: Secondary | ICD-10-CM | POA: Diagnosis not present

## 2020-07-18 DIAGNOSIS — K439 Ventral hernia without obstruction or gangrene: Secondary | ICD-10-CM

## 2020-07-18 DIAGNOSIS — E1142 Type 2 diabetes mellitus with diabetic polyneuropathy: Secondary | ICD-10-CM

## 2020-07-18 LAB — GLUCOSE, CAPILLARY: Glucose-Capillary: 287 mg/dL — ABNORMAL HIGH (ref 70–99)

## 2020-07-18 LAB — POCT GLYCOSYLATED HEMOGLOBIN (HGB A1C): Hemoglobin A1C: 11.1 % — AB (ref 4.0–5.6)

## 2020-07-18 MED ORDER — ABDOMINAL BINDER/ELASTIC XL MISC: 1.0000 [IU] | 0 refills | Status: DC | PRN

## 2020-07-18 MED ORDER — METFORMIN HCL ER 500 MG PO TB24: 2000.0000 mg | ORAL_TABLET | Freq: Every day | ORAL | 3 refills | Status: DC

## 2020-07-18 MED ORDER — EMPAGLIFLOZIN 25 MG PO TABS: 25.0000 mg | ORAL_TABLET | Freq: Every day | ORAL | 3 refills | Status: DC

## 2020-07-18 NOTE — Patient Instructions (Addendum)
Dear Mr.Antoneo Zenaida Niece,  Thank you for allowing [place] to provide your care today. Today we discussed your leg pain    I have ordered bmp labs for you. I will call if any are abnormal.    Today we made the following changes to your medications:    Please increase your empagliflozin to 25mg  daily Please increase your metformin ER to 1000mg  Please check your sugars regularly Once your lab results come back, I will talk to you about getting a new medication for your neuropathy  Please follow-up in 3 months   Please call the internal medicine center clinic if you have any questions or concerns, we may be able to help and keep you from a long and expensive emergency room wait. Our clinic and after hours phone number is [phone], the best time to call is Monday through Friday 9 am to 4 pm but there is always someone available 24/7 if you have an emergency. If you need medication refills please notify your pharmacy one week in advance and they will send [place] a request.    If you have not gotten the COVID vaccine, I recommend doing so:  You may get it at your local [facility] or [facility] OR To schedule an appointment for a COVID vaccine or be added to the vaccine wait list: Go to [email or web address]   OR Go to [email or web address]                  OR Call [phone]                                     OR Call [phone] and select Option 2  Thank you for choosing [facility]  Diabetic Neuropathy Diabetic neuropathy refers to nerve damage that is caused by diabetes. Over time, people with diabetes can develop nerve damage throughout the body. There are several types of diabetic neuropathy:  Peripheral neuropathy. This is the most common type of diabetic neuropathy. It damages the nerves that carry signals between the spinal cord and other parts of the body (peripheral nerves). This usually affects nerves in the feet, legs, hands, and arms.  Autonomic neuropathy. This type causes  damage to nerves that control involuntary functions (autonomic nerves). Involuntary functions are functions of the body that you do not control. They include heartbeat, body temperature, blood pressure, urination, digestion, sweating, sexual function, or response to changes in blood glucose.  Focal neuropathy. This type of nerve damage affects one area of the body, such as an arm, a leg, or the face. The injury may involve one nerve or a small group of nerves. Focal neuropathy can be painful and unpredictable. It occurs most often in older adults with diabetes. This often develops suddenly, but usually improves over time and does not cause long-term problems.  Proximal neuropathy. This type of nerve damage affects the nerves of the thighs, hips, buttocks, or legs. It causes severe pain, weakness, and muscle death (atrophy), usually in the thigh muscles. It is more common among older men and people who have type 2 diabetes. The length of recovery time may vary. What are the causes? Peripheral, autonomic, and focal neuropathies are caused by diabetes that is not well controlled with treatment. The cause of proximal neuropathy is not known, but it may be caused by inflammation related to uncontrolled blood glucose levels. What are the signs or symptoms? Peripheral neuropathy Peripheral neuropathy develops slowly  over time. When the nerves of the feet and legs no longer work, you may experience:  Burning, stabbing, or aching pain in the legs or feet.  Pain or cramping in the legs or feet.  Loss of feeling (numbness) and inability to feel pressure or pain in the feet. This can lead to: ? Thick calluses or sores on areas of constant pressure. ? Ulcers. ? Reduced ability to feel temperature changes.  Foot deformities.  Muscle weakness.  Loss of balance or coordination. Autonomic neuropathy The symptoms of autonomic neuropathy vary depending on which nerves are affected. Symptoms may  include:  Problems with digestion, such as: ? Nausea or vomiting. ? Poor appetite. ? Bloating. ? Diarrhea or constipation. ? Trouble swallowing. ? Losing weight without trying to.  Problems with the heart, blood, and lungs, such as: ? Dizziness, especially when standing up. ? Fainting. ? Shortness of breath. ? Irregular heartbeat.  Bladder problems, such as: ? Trouble starting or stopping urination. ? Leaking urine. ? Trouble emptying the bladder. ? Urinary tract infections (UTIs).  Problems with other body functions, such as: ? Sweat. You may sweat too much or too little. ? Temperature. You might get hot easily. Or, you might feel cold more than usual. ? Sexual function. Men may not be able to get or maintain an erection. Women may have vaginal dryness and difficulty with arousal. Focal neuropathy Symptoms affect only one area of the body. Common symptoms include:  Numbness.  Tingling.  Burning pain.  Prickling feeling.  Very sensitive skin.  Weakness.  Inability to move (paralysis).  Muscle twitching.  Muscles getting smaller (wasting).  Poor coordination.  Double or blurred vision. Proximal neuropathy  Sudden, severe pain in the hip, thigh, or buttocks. Pain may spread from the back into the legs (sciatica).  Pain and numbness in the arms and legs.  Tingling.  Loss of bladder control or bowel control.  Weakness and wasting of thigh muscles.  Difficulty getting up from a seated position.  Abdominal swelling.  Unexplained weight loss. How is this diagnosed? Diagnosis varies depending on the type of neuropathy your health care provider suspects. Peripheral neuropathy Your health care provider will do a neurologic exam. This exam checks your reflexes, how you move, and what you can feel. You may have other tests, such as:  Blood tests.  Tests of the fluid that surrounds the spinal cord (lumbar puncture).  CT scan.  MRI.  Checking the  nerves that control muscles (electromyogram, or EMG).  Checking how quickly signals pass through your nerves (nerve conduction study).  Checking a small piece of a nerve using a microscope (biopsy). Autonomic neuropathy You may have tests, such as:  Tests to measure your blood pressure and heart rate. You may be secured to an exam table that moves you from a lying position to an upright position (table tilt test).  Breathing tests to check your lungs.  Tests to check how food moves through the digestive system (gastric emptying tests).  Blood, sweat, or urine tests.  Ultrasound of your bladder.  Spinal fluid tests. Focal neuropathy This condition may be diagnosed with:  A neurologic exam.  CT scan.  MRI.  EMG.  Nerve conduction study. Proximal neuropathy There is no test to diagnose this type of neuropathy. You may have tests to rule out other possible causes of this type of neuropathy. Tests may include:  X-rays of your spine and lumbar region.  Lumbar puncture.  MRI. How is this treated? The  goal of treatment is to keep nerve damage from getting worse. Treatment may include:  Following your diabetes management plan. This will help keep your blood glucose level and your A1C level within your target range. This is the most important treatment.  Using prescription pain medicine. Follow these instructions at home: Diabetes management Follow your diabetes management plan as told by your health care provider.  Check your blood glucose levels.  Keep your blood glucose in your target range.  Have your A1C level checked at least two times a year, or as often as told.  Take over the counter and prescription medicines only as told by your health care provider. This includes insulin and diabetes medicine.   Lifestyle  Do not use any products that contain nicotine or tobacco, such as cigarettes, e-cigarettes, and chewing tobacco. If you need help quitting, ask your  health care provider.  Be physically active every day. Include strength training and balance exercises.  Follow a healthy meal plan.  Work with your health care provider to manage your blood pressure.   General instructions  Ask your health care provider if the medicine prescribed to you requires you to avoid driving or using machinery.  Check your skin and feet every day for cuts, bruises, redness, blisters, or sores.  Keep all follow-up visits. This is important. Contact a health care provider if:  You have burning, stabbing, or aching pain in your legs or feet.  You are unable to feel pressure or pain in your feet.  You develop problems with digestion, such as: ? Nausea. ? Vomiting. ? Bloating. ? Constipation. ? Diarrhea. ? Abdominal pain.  You have difficulty with urination, such as: ? Inability to control when you urinate (incontinence). ? Inability to completely empty the bladder (retention).  You feel as if your heart is racing (palpitations).  You feel dizzy, weak, or faint when you stand up. Get help right away if:  You cannot urinate.  You have sudden weakness or loss of coordination.  You have trouble speaking.  You have pain or pressure in your chest.  You have an irregular heartbeat.  You have sudden inability to move a part of your body. These symptoms may represent a serious problem that is an emergency. Do not wait to see if the symptoms will go away. Get medical help right away. Call your local emergency services (911 in the [place] Do not drive yourself to the hospital. Summary  Diabetic neuropathy is nerve damage that is caused by diabetes. It can cause numbness and pain in the arms, legs, digestive tract, heart, and other body systems.  This condition is treated by keeping your blood glucose level and your A1C level within your target range. This can help prevent neuropathy from getting worse.  Check your skin and feet every day for cuts,  bruises, redness, blisters, or sores.  Do not use any products that contain nicotine or tobacco, such as cigarettes, e-cigarettes, and chewing tobacco. This information is not intended to replace advice given to you by your health care provider. Make sure you discuss any questions you have with your health care provider. Document Revised: 09/13/2019 Document Reviewed: 09/13/2019 Elsevier Patient Education  [place].

## 2020-07-19 LAB — BMP8+ANION GAP
Anion Gap: 20 mmol/L — ABNORMAL HIGH (ref 10.0–18.0)
BUN/Creatinine Ratio: 10 (ref 10–24)
BUN: 19 mg/dL (ref 8–27)
CO2: 22 mmol/L (ref 20–29)
Calcium: 9.2 mg/dL (ref 8.6–10.2)
Chloride: 96 mmol/L (ref 96–106)
Creatinine, Ser: 1.85 mg/dL — ABNORMAL HIGH (ref 0.76–1.27)
Glucose: 262 mg/dL — ABNORMAL HIGH (ref 65–99)
Potassium: 3.5 mmol/L (ref 3.5–5.2)
Sodium: 138 mmol/L (ref 134–144)
eGFR: 41 mL/min/{1.73_m2} — ABNORMAL LOW (ref >59)

## 2020-07-20 ENCOUNTER — Encounter: Payer: Self-pay | Admitting: Internal Medicine

## 2020-07-20 NOTE — Assessment & Plan Note (Signed)
Has hx of ventral hernia s/p surgical repair. On exam shows recurrence. Unfortunately difficult to definitvely treat due to morbid obesity. Complains that abdominal binder is falling apart.  - Order sent in for binder

## 2020-07-20 NOTE — Assessment & Plan Note (Signed)
Terry Parks is a 65 yo M w/ PMH of diabetes, hld, CKD3, obesity presenting with complaints of bilateral lower extremity pain. He mentions having significant burning sensation that radiates downward. He mentions being previously on duloxetine as well as gabapentin with minimal improvement in his symptoms. He mentions relief with nonpharmacologic interventions such as massages and soaking his feet in water. He describes pain as constant and is severe enough to interfere with his quality of life. He mentions being told recently that his PCS services would not provide foot massage for him although he feels he needs them more than their assistance with his IADls.  A/P Present with chronic pain of lower extremities due to diabetic neuropathy. Exam shows diminished sensation. On prior visit, B12 was checked which was wnl. Currently using non-pharmacologic intervention but express interest in re-starting medications. Gabapentin and duloxetine were ineffective. May benefit from pregabalin but has hsitory of CKD. Will get bmp to check. - C/w foot massage, foot soaks - Check bmp - Can start lyrica if renal fx ok

## 2020-07-20 NOTE — Progress Notes (Signed)
CC: leg pain  HPI: Terry Parks is a 65 y.o. with PMH listed below presenting with complaint of leg pain. Please see problem based assessment and plan for further details.  Past Medical History:  Diagnosis Date  . Abscessed tooth    top back large cavity no pain or drainage, one on bottom  pt pulled tooth 4-5 months ago, right top large hole in tooth  . AKI (acute kidney injury) ([place])   . Allergic rhinitis   . Anemia   . Anxiety   . Asthma   . Atrial fibrillation ([place])   . BPH (benign prostatic hypertrophy)    Massive BPH noted on cystoscopy 1/23/ 2012 by Dr. Risa Grill.  . Cancer [facility])    prostate cancer 2019  . Cardiomyopathy ([place])   . CHF (congestive heart failure) ([place])   . Cough 03/30/2012  . Depression   . Diabetes mellitus 04/08/2008   type 2  . Dyspnea   . Dysrhythmia 2019  . Foley catheter in place 07-05-17 placed  . Fracture, orbital ([place]) 2021   Right  . GERD (gastroesophageal reflux disease) 2020  . Headache(784.0)    hx migraines none recent  . Hyperlipemia   . Hypertension   . Hypertensive cardiopathy 03/01/2006   2-D echocardiogram 02/01/2012 showed moderate LVH, mildly to moderately reduced left ventricular systolic function with an estimated ejection fraction of 40-45%, and diffuse hypokinesis.  A nuclear medicine stress study done 01/31/2012 showed no reversible ischemia, a small mid anterior wall fixed defect/infarct, and ejection fraction 42%.      . Neck pain   . Nephrolithiasis 05/29/2010   CT scan of abdomen/pelvis on 05/29/2010 showed an obstructing approximate 1-2 mm calculus at the left UVJ, and an approximate 1-2 mm left lower pole renal calculus.   Patient had continuing severe pain , and an elevation of his serum creatinine to a value of 1.75 on 06/06/2010.  Patient underwent cystoscopy on 06/08/2010 by Dr. Risa Grill, but attempts at retrograde pyelogram and ureteroscopy were unsucc  . Numbness 01/08/2018  . Obstructive sleep apnea 03/06/2008    Sleep study 03/06/08 showed severe OSA/hypopnea syndrome, with successful CPAP titration to 13 CWP using a medium ResMed Mirage Quattro full face mask with heated humidifier.   . Rash 04/17/2014  . Renal calculus 05/29/2010   CT scan of abdomen/pelvis on 05/29/2010 showed an obstructing approximate 1-2 mm calculus at the left UVJ, and an approximate 1-2 mm left lower pole renal calculus.   Patient had continuing severe pain , and an elevation of his serum creatinine to a value of 1.75 on 06/06/2010.  The stone had apparently passed and was not seen on repeat CT 06/08/2010.  . Sleep apnea    haven't use cpap in 2 years  . Tooth pain 10/29/2017  . Urinary straining 11/02/2016    Review of Systems: Review of Systems  Constitutional: Negative for chills, fever and malaise/fatigue.  Eyes: Negative for blurred vision.  Respiratory: Negative for shortness of breath.   Cardiovascular: Negative for chest pain, palpitations and leg swelling.  Gastrointestinal: Negative for constipation, diarrhea, nausea and vomiting.  Neurological: Positive for tingling and sensory change. Negative for focal weakness.  All other systems reviewed and are negative.    Physical Exam: Vitals:   07/18/20 1021  BP: 115/69  Pulse: 84  Temp: 97.6 F (36.4 C)  TempSrc: Oral  SpO2: 100%  Weight: (!) 309 lb 9.6 oz (140.4 kg)  Height: 6\' 1"  (1.854 m)   Gen: Well-developed,  well nourished, NAD HEENT: NCAT head, hearing intact CV: RRR, S1, S2 normal Pulm: CTAB, No rales, no wheezes Abd: Distended with reducible ventral hernia, BS+, Non-tender to palpation Extm: ROM intact, Peripheral pulses intact, No peripheral edema Skin: Dry, Warm, normal turgor Neuro: Diminished pin-prick sensation on bilateral lower extremities  Assessment & Plan:   Neuropathy in diabetes ([place]) [name] is a 65 yo M w/ PMH of diabetes, hld, CKD3, obesity presenting with complaints of bilateral lower extremity pain. He mentions having significant  burning sensation that radiates downward. He mentions being previously on duloxetine as well as gabapentin with minimal improvement in his symptoms. He mentions relief with nonpharmacologic interventions such as massages and soaking his feet in water. He describes pain as constant and is severe enough to interfere with his quality of life. He mentions being told recently that his PCS services would not provide foot massage for him although he feels he needs them more than their assistance with his IADls.  A/P Present with chronic pain of lower extremities due to diabetic neuropathy. Exam shows diminished sensation. On prior visit, B12 was checked which was wnl. Currently using non-pharmacologic intervention but express interest in re-starting medications. Gabapentin and duloxetine were ineffective. May benefit from pregabalin but has hsitory of CKD. Will get bmp to check. - C/w foot massage, foot soaks - Check bmp - Can start lyrica if renal fx ok  Type 2 diabetes mellitus with peripheral neuropathy ([facility]) Lab Results  Component Value Date   HGBA1C 11.1 (A) 07/18/2020   Diabetes uncontrolled. Mentions difficulty with dietary adherence due to drinking sodas and other sweet food items. Mentions also difficulty with adherence due to forgetting to take his meds sometimes. Currently on Xultolphy, Jardiance, Metformin. Denies nausea, vomiting, malaise. Denies polyuria, polydipsia, polyphagia.  A/P Uncontrolled diabetes complicated by med adherence. Currently prescribed max dose of Xultophy and metformin. Will increase Jardiance to max dose. - Increase Jardiance to 25mg  daily - C/w metformin, xultophy - F/u in 3 months  Stage 3 chronic kidney disease Has hx of CKD stage 3 with GFR of 50. Baseline creatinine around 1.3. Currently on SGLT-2 inhibitor as well as Entresto. Likely CKD multi-factorial in setting of uncontrolled diabetes, heart failure, htn. Need better control.   A/P - Bmp today - C/w  Entresto - Increase Jardiance dose  Ventral hernia without obstruction or gangrene Has hx of ventral hernia s/p surgical repair. On exam shows recurrence. Unfortunately difficult to definitvely treat due to morbid obesity. Complains that abdominal binder is falling apart.  - Order sent in for binder    Patient discussed with Dr. Rebeca Alert  -Gilberto Better, [facility] Pager: [phone]

## 2020-07-20 NOTE — Assessment & Plan Note (Signed)
Has hx of CKD stage 3 with GFR of 50. Baseline creatinine around 1.3. Currently on SGLT-2 inhibitor as well as Entresto. Likely CKD multi-factorial in setting of uncontrolled diabetes, heart failure, htn. Need better control.   A/P - Bmp today - C/w Entresto - Increase Jardiance dose

## 2020-07-20 NOTE — Assessment & Plan Note (Signed)
Lab Results  Component Value Date   HGBA1C 11.1 (A) 07/18/2020   Diabetes uncontrolled. Mentions difficulty with dietary adherence due to drinking sodas and other sweet food items. Mentions also difficulty with adherence due to forgetting to take his meds sometimes. Currently on Xultolphy, Jardiance, Metformin. Denies nausea, vomiting, malaise. Denies polyuria, polydipsia, polyphagia.  A/P Uncontrolled diabetes complicated by med adherence. Currently prescribed max dose of Xultophy and metformin. Will increase Jardiance to max dose. - Increase Jardiance to 25mg  daily - C/w metformin, xultophy - F/u in 3 months

## 2020-07-22 ENCOUNTER — Telehealth: Payer: Self-pay | Admitting: Internal Medicine

## 2020-07-22 NOTE — Progress Notes (Signed)
Internal Medicine Clinic Attending  Case discussed with Dr. Lee at the time of the visit.  We reviewed the resident's history and exam and pertinent patient test results.  I agree with the assessment, diagnosis, and plan of care documented in the resident's note.  Alexander Raines, M.D., Ph.D.  

## 2020-07-22 NOTE — Telephone Encounter (Signed)
Attempted to speak with Terry Parks regarding starting lower dose of Lyrica due to his GFR. Mr.Brener did not pick up. Left voicemail with callback number.

## 2020-07-28 ENCOUNTER — Encounter: Payer: Self-pay | Admitting: *Deleted

## 2020-08-05 MED FILL — TORSEMIDE 20 MG TABLET: 20 | 30 days supply | Qty: 60 | Fill #1

## 2020-08-05 MED FILL — JARDIANCE 25 MG TABLET: 25 | 90 days supply | Qty: 90 | Fill #0

## 2020-08-05 MED FILL — ENTRESTO 97 MG-103 MG TAB: 97-103 | 30 days supply | Qty: 60 | Fill #2

## 2020-08-05 MED FILL — OMEPRAZOLE 40 MG CPDR: 40 | 30 days supply | Qty: 60 | Fill #2

## 2020-08-05 MED FILL — XULTOPHY 100 UNIT-3.6MG/ML: 100-3.6 | 12 days supply | Qty: 6 | Fill #4

## 2020-08-07 ENCOUNTER — Encounter: Payer: Self-pay | Admitting: Cardiovascular Disease

## 2020-08-07 ENCOUNTER — Other Ambulatory Visit: Payer: Self-pay

## 2020-08-07 ENCOUNTER — Ambulatory Visit (INDEPENDENT_AMBULATORY_CARE_PROVIDER_SITE_OTHER): Payer: Medicaid Other | Admitting: Cardiovascular Disease

## 2020-08-07 VITALS — BP 122/84 | HR 80 | Ht 73.0 in | Wt 321.0 lb

## 2020-08-07 DIAGNOSIS — Z7901 Long term (current) use of anticoagulants: Secondary | ICD-10-CM | POA: Diagnosis not present

## 2020-08-07 DIAGNOSIS — N1831 Chronic kidney disease, stage 3a: Secondary | ICD-10-CM

## 2020-08-07 DIAGNOSIS — Z5181 Encounter for therapeutic drug level monitoring: Secondary | ICD-10-CM | POA: Diagnosis not present

## 2020-08-07 DIAGNOSIS — E785 Hyperlipidemia, unspecified: Secondary | ICD-10-CM

## 2020-08-07 DIAGNOSIS — I428 Other cardiomyopathies: Secondary | ICD-10-CM

## 2020-08-07 DIAGNOSIS — I5042 Chronic combined systolic (congestive) and diastolic (congestive) heart failure: Secondary | ICD-10-CM | POA: Diagnosis not present

## 2020-08-07 DIAGNOSIS — Z79899 Other long term (current) drug therapy: Secondary | ICD-10-CM

## 2020-08-07 DIAGNOSIS — E1142 Type 2 diabetes mellitus with diabetic polyneuropathy: Secondary | ICD-10-CM

## 2020-08-07 DIAGNOSIS — I480 Paroxysmal atrial fibrillation: Secondary | ICD-10-CM

## 2020-08-07 DIAGNOSIS — E1169 Type 2 diabetes mellitus with other specified complication: Secondary | ICD-10-CM

## 2020-08-07 DIAGNOSIS — G4733 Obstructive sleep apnea (adult) (pediatric): Secondary | ICD-10-CM

## 2020-08-07 NOTE — Patient Instructions (Signed)
Medication Instructions:  STOP the Atorvastatin STOP the Losartan  *If you need a refill on your cardiac medications before your next appointment, please call your pharmacy*   Lab Work: None ordered If you have labs (blood work) drawn today and your tests are completely normal, you will receive your results only by: [name] MyChart Message (if you have MyChart) OR . A paper copy in the mail If you have any lab test that is abnormal or we need to change your treatment, we will call you to review the results.   Testing/Procedures: None ordered   Follow-Up: At [facility], you and your health needs are our priority.  As part of our continuing mission to provide you with exceptional heart care, we have created designated Provider Care Teams.  These Care Teams include your primary Cardiologist (physician) and Advanced Practice Providers (APPs -  Physician Assistants and Nurse Practitioners) who all work together to provide you with the care you need, when you need it.  We recommend signing up for the patient portal called "MyChart".  Sign up information is provided on this After Visit Summary.  MyChart is used to connect with patients for Virtual Visits (Telemedicine).  Patients are able to view lab/test results, encounter notes, upcoming appointments, etc.  Non-urgent messages can be sent to your provider as well.   To learn more about what you can do with MyChart, go to [email or web address].    Your next appointment:   3 month(s)  The format for your next appointment:   In Person  Provider:   You may see [name], MD or one of the following Advanced Practice Providers on your designated Care Team:    Almyra Deforest, PA-C  Fabian Sharp, [place] or   Roby Lofts, [place]    Other Instructions Your physician recommends that you weigh yourself everyday at the same time, on the same scale and with the same amount of clothing. Please keep a record of these weights and bring to your next  appointment.  Please have your weight available when Dr. Sallyanne Kuster calls you with the Echo results.

## 2020-08-07 NOTE — Progress Notes (Signed)
Patient ID: Terry Parks, male   DOB: Feb 23, 1956, 65 y.o.   MRN: [id number]    Cardiology Office Note    Date:  08/07/2020   ID:  Terry Parks, DOB [birth year], MRN [id number]  PCP:  [name], MD  Cardiologist:   [name], MD   Chief Complaint  Patient presents with  . Congestive Heart Failure    History of Present Illness:  Terry Parks is a 65 y.o. male with  combined systolic and diastolic heart failure (LVEF 35-40% by most recent echo October 2020) paroxysmal atrial fibrillation. He has never had catheterization and has not had angina pectoris, but a nuclear stress test in 2013 showed a fixed anterior defect.  He has systemic hypertension, severe obesity, obstructive sleep apnea on CPAP, and diabetes mellitus. The diagnosis of heart failure dates back to at least 2013 when he had a stress test that showed a fixed anterior defect and an ejection fraction of 30-35%.  He was hospitalized with acute heart failure exacerbation in January 2019, in the setting of acute kidney injury secondary to nephrolithiasis with ureteral obstruction.  During that same admission he had atrial fibrillation, treated with cardioversion but followed by recurrence on atrial flutter, treated with amiodarone and anticoagulation with rivaroxaban.  He last required hospitalization for heart failure exacerbation in March 2021.  Discharge weight was 318 pounds (only 44 kg and discharge creatinine was 1.57 (baseline 1.3-1.4).  He developed transient acute kidney injury during the hospitalization for incisional hernia repair November 2021, but his kidney function recovered rapidly.  Most recently his dry weight syncope 308-309 pounds.  Today he weighs 221 pounds but he has been "out of his diuretic" for the last 3 days.  He only realized this when he noticed that his urine output was very low.  He has substantial lower extremity edema, although he does not acknowledge it.  He denies any problems with  shortness of breath at rest or with activity.  He is tired because he stayed up on his computer until 6 AM today.  Blood pressure control has been excellent.  He denies orthopnea, PND, angina at rest or with cavity, palpitations, dizziness or syncope.  His list of medications continues to be of moderate concern.  He is taking both losartan and Entresto as well as atorvastatin and Caduet.  He try to clean everything up today.  He identifies his prescription bottles with numbers on his cell phone list.  He will stop #8 atorvastatin #9 Cozaar.  He has mild ankle swelling off-and-on.  He denies orthopnea or PND.  He is quite sedentary but for what is worth he does not have angina or dyspnea with light activity.  He complains of his legs and feet hurting" all the time".  The symptoms sound fairly typical for neuropathy, with associated numbness and tingling in both lower extremities.  He denies falls, injuries or bleeding complications and is compliant with Xarelto anticoagulation.  He is on maximum dose Entresto and carvedilol as well as Jardiance and spironolactone.  His most recent creatinine was 1.85 on 07/18/2020.  His most recent hemoglobin A1c was 11.1% on the same date.  Potassium was 3.5.    Past Medical History:  Diagnosis Date  . Abscessed tooth    top back large cavity no pain or drainage, one on bottom  pt pulled tooth 4-5 months ago, right top large hole in tooth  . AKI (acute kidney injury) ([place])   . Allergic rhinitis   . Anemia   .  Anxiety   . Asthma   . Atrial fibrillation ([place])   . BPH (benign prostatic hypertrophy)    Massive BPH noted on cystoscopy 1/23/ 2012 by Dr. Risa Grill.  . Cancer [facility])    prostate cancer 2019  . Cardiomyopathy ([place])   . CHF (congestive heart failure) ([place])   . Cough 03/30/2012  . Depression   . Diabetes mellitus 04/08/2008   type 2  . Dyspnea   . Dysrhythmia 2019  . Foley catheter in place 07-05-17 placed  . Fracture, orbital ([place]) 2021   Right   . GERD (gastroesophageal reflux disease) 2020  . Headache(784.0)    hx migraines none recent  . Hyperlipemia   . Hypertension   . Hypertensive cardiopathy 03/01/2006   2-D echocardiogram 02/01/2012 showed moderate LVH, mildly to moderately reduced left ventricular systolic function with an estimated ejection fraction of 40-45%, and diffuse hypokinesis.  A nuclear medicine stress study done 01/31/2012 showed no reversible ischemia, a small mid anterior wall fixed defect/infarct, and ejection fraction 42%.      . Neck pain   . Nephrolithiasis 05/29/2010   CT scan of abdomen/pelvis on 05/29/2010 showed an obstructing approximate 1-2 mm calculus at the left UVJ, and an approximate 1-2 mm left lower pole renal calculus.   Patient had continuing severe pain , and an elevation of his serum creatinine to a value of 1.75 on 06/06/2010.  Patient underwent cystoscopy on 06/08/2010 by Dr. Risa Grill, but attempts at retrograde pyelogram and ureteroscopy were unsucc  . Numbness 01/08/2018  . Obstructive sleep apnea 03/06/2008   Sleep study 03/06/08 showed severe OSA/hypopnea syndrome, with successful CPAP titration to 13 CWP using a medium ResMed Mirage Quattro full face mask with heated humidifier.   . Rash 04/17/2014  . Renal calculus 05/29/2010   CT scan of abdomen/pelvis on 05/29/2010 showed an obstructing approximate 1-2 mm calculus at the left UVJ, and an approximate 1-2 mm left lower pole renal calculus.   Patient had continuing severe pain , and an elevation of his serum creatinine to a value of 1.75 on 06/06/2010.  The stone had apparently passed and was not seen on repeat CT 06/08/2010.  . Sleep apnea    haven't use cpap in 2 years  . Tooth pain 10/29/2017  . Urinary straining 11/02/2016    Past Surgical History:  Procedure Laterality Date  . big toe nails removed Bilateral 20 yrs ago  . CARDIOVERSION N/A 06/08/2017   Procedure: CARDIOVERSION;  Surgeon: Josue Hector, MD;  [facility] END[facility]COPY;  Service:  Cardiovascular;  Laterality: N/A;  . COLONOSCOPY    . CYSTOSCOPY W/ RETROGRADES    . FRACTURE SURGERY Right 2021   5th right phalange  . INCISIONAL HERNIA REPAIR N/A 03/24/2020   Procedure: LAPAROSCOPIC INCISIONAL HERNIA REPAR WITH MESH;  Surgeon: Georganna Skeans, MD;  Location: [facility];  Service: General;  Laterality: N/A;  . IR RADIOLOGIST EVAL & MGMT  02/02/2018  . LYMPHADENECTOMY Bilateral 10/06/2017   Procedure: LYMPHADENECTOMY;  Surgeon: Lucas Mallow, MD;  Location: [facility] ORS;  Service: Urology;  Laterality: Bilateral;  . ROBOT ASSISTED LAPAROSCOPIC RADICAL PROSTATECTOMY N/A 10/06/2017   Procedure: XI ROBOTIC ASSISTED LAPAROSCOPIC RADICAL PROSTATECTOMY;  Surgeon: Lucas Mallow, MD;  Location: [facility] ORS;  Service: Urology;  Laterality: N/A;  . TRANSURETHRAL RESECTION OF BLADDER TUMOR N/A 07/13/2017   Procedure: TRANSURETHRAL RESECTION OF PROSTATE;  Surgeon: Lucas Mallow, MD;  Location: [facility] ORS;  Service: Urology;  Laterality: N/A;  Outpatient Medications Prior to Visit  Medication Sig Dispense Refill  . Accu-Chek Softclix Lancets lancets Check blood sugar three times a day as instructed 300 each 3  . acetaminophen (TYLENOL) 325 MG tablet Take 650 mg by mouth every 6 (six) hours as needed for moderate pain.     [name] amiodarone (PACERONE) 200 MG tablet Take 1 tablet (200 mg total) by mouth daily. 30 tablet 1  . amLODipine-atorvastatin (CADUET) 5-40 MG tablet Take 1 tablet by mouth daily.    . carvedilol (COREG) 25 MG tablet TAKE 1 TABLET (25 MG TOTAL) BY MOUTH 2 (TWO) TIMES DAILY WITH A MEAL. 180 tablet 1  . DULoxetine (CYMBALTA) 60 MG capsule Take 60 mg by mouth daily.    . empagliflozin (JARDIANCE) 25 MG TABS tablet Take 1 tablet (25 mg total) by mouth daily before breakfast. 90 tablet 3  . 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 insulin dependent 300 each 11  . Insulin Degludec-Liraglutide (XULTOPHY) 100-3.6 UNIT-MG/ML SOPN  Inject 50 Units into the skin daily. 5 pen 11  . Insulin Pen Needle (CARETOUCH PEN NEEDLES) 31G X 6 MM MISC 1 pen by Does not apply route at bedtime. 100 each 3  . metFORMIN (GLUCOPHAGE XR) 500 MG 24 hr tablet Take 4 tablets (2,000 mg total) by mouth daily with breakfast. 360 tablet 3  . nystatin (NYSTATIN) powder Apply 1 application topically 3 (three) times daily. 60 g 1  . omeprazole (PRILOSEC) 40 MG capsule TAKE 1 CAPSULE BY MOUTH TWICE DAILY BEFORE A MEAL. 60 capsule 3  . rivaroxaban (XARELTO) 20 MG TABS tablet Take 1 tablet (20 mg total) by mouth daily with breakfast. 90 tablet 0  . sacubitril-valsartan (ENTRESTO) 97-103 MG Take 1 tablet by mouth 2 (two) times daily. 180 tablet 1  . spironolactone (ALDACTONE) 50 MG tablet Take 50 mg by mouth daily.    [name] torsemide (DEMADEX) 20 MG tablet Take 2 tablets (40 mg total) by mouth daily. 60 tablet 5  . atorvastatin (LIPITOR) 40 MG tablet Take 1 tablet (40 mg total) by mouth daily. 90 tablet 3  . losartan (COZAAR) 100 MG tablet Take 100 mg by mouth daily.    . Elastic Bandages & Supports (ABDOMINAL BINDER/ELASTIC XL) MISC 1 Units by Does not apply route as needed. (Patient not taking: Reported on 08/07/2020) 1 each 0   No facility-administered medications prior to visit.     Allergies:   Lisinopril   Social History   Socioeconomic History  . Marital status: Single    Spouse name: Not on file  . Number of children: 4  . Years of education: 22  . Highest education level: Not on file  Occupational History  . Occupation:        Employer: UNEMPLOYED  Tobacco Use  . Smoking status: Never Smoker  . Smokeless tobacco: Never Used  Vaping Use  . Vaping Use: Never used  Substance and Sexual Activity  . Alcohol use: No    Alcohol/week: 0.0 standard drinks  . Drug use: No  . Sexual activity: Not Currently  Other Topics Concern  . Not on file  Social History Narrative   Divorced, 4 children, lives alone.     Social Determinants of Health    Financial Resource Strain: Not on file  Food Insecurity: Not on file  Transportation Needs: Not on file  Physical Activity: Not on file  Stress: Not on file  Social Connections: Not  on file     Family History:  The patient's family history includes Breast cancer in his mother; Diabetes in his maternal grandmother; Hypertension in his father.   ROS:   Please see the history of present illness.    All other systems are reviewed and are negative.   PHYSICAL EXAM:   VS:  BP 122/84 (BP Location: Right Arm, Patient Position: Sitting)   Pulse 80   Ht 6\' 1"  (1.854 m)   Wt (!) 321 lb (145.6 kg)   SpO2 98%   BMI 42.35 kg/m     General: Alert, oriented x3, no distress, Morbidly obese. Head: no evidence of trauma, PERRL, EOMI, no exophtalmos or lid lag, no myxedema, no xanthelasma; normal ears, nose and oropharynx Neck: normal jugular venous pulsations and no hepatojugular reflux; brisk carotid pulses without delay and no carotid bruits Chest: clear to auscultation, no signs of consolidation by percussion or palpation, normal fremitus, symmetrical and full respiratory excursions Cardiovascular: normal position and quality of the apical impulse, regular rhythm, normal first and second heart sounds, no murmurs, rubs or gallops Abdomen: no tenderness or distention, no masses by palpation, no abnormal pulsatility or arterial bruits, normal bowel sounds, no hepatosplenomegaly Extremities: no clubbing, cyanosis or edema; 2+ radial, ulnar and brachial pulses bilaterally; 2+ right femoral, posterior tibial and dorsalis pedis pulses; 2+ left femoral, posterior tibial and dorsalis pedis pulses; no subclavian or femoral bruits Neurological: grossly nonfocal Psych: Normal mood and affect   Wt Readings from Last 3 Encounters:  08/07/20 (!) 321 lb (145.6 kg)  07/18/20 (!) 309 lb 9.6 oz (140.4 kg)  04/23/20 (!) 308 lb (139.7 kg)      Studies/Labs Reviewed:   EKG:  EKG is not ordered today.  His  most recent tracing shows an-normal sinus rhythm and a prolonged QTC at 490 ms (on amiodarone), but is otherwise a completely normal tracing.  Recent Labs: 08/10/2019: B Natriuretic Peptide 220.7 08/13/2019: Magnesium 2.3 01/11/2020: ALT 13; TSH 0.686 03/25/2020: Hemoglobin 12.7; Platelets 182 07/18/2020: BUN 19; Creatinine, Ser 1.85; Potassium 3.5; Sodium 138   Lipid Panel    Component Value Date/Time   CHOL 236 (H) 12/12/2019 1716   TRIG 182 (H) 12/12/2019 1716   HDL 28 (L) 12/12/2019 1716   CHOLHDL 8.4 (H) 12/12/2019 1716   CHOLHDL 3.9 01/09/2018 0509   VLDL 11 01/09/2018 0509   LDLCALC 174 (H) 12/12/2019 1716     ASSESSMENT:    1. Chronic combined systolic and diastolic CHF (congestive heart failure) ([place])   2. PAF (paroxysmal atrial fibrillation) ([place])   3. Encounter for monitoring amiodarone therapy   4. Long term (current) use of anticoagulants   5. Nonischemic cardiomyopathy ([place])   6. OSA (obstructive sleep apnea)   7. Type 2 diabetes mellitus with peripheral neuropathy (H[facility])   8. Stage 3a chronic kidney disease ([place])   9. Morbid obesity ([place])   10. Hyperlipidemia associated with type 2 diabetes mellitus ([place])      PLAN:  In order of problems listed above:  1. CHF: He does not have any symptoms of left heart failure, but is clearly hypervolemic, roughly 12 pounds above his dry weight, because he has not been taking his diuretic.  Restarting his diuretic today.  Target dry weight under 310 pounds.  I do not think it would be meaningful for [place] to check labs today, but he needs to have frequent retesting the potassium and creatinine levels.  Will allow a week or 2 to pass  after he restarts his diuretics, to allow him to reach steady state. 2. AFib: He has not had recent symptoms to suggest recurrence of atrial fibrillation.  No arrhythmic events during his recent admission for laparoscopic hernia repair.  CHADSVasc 3-4 (HTN, CHF, DM, +/- CAD) on  anticoagulation. 3. Amiodarone monitoring: He is due for liver function tests and TSH, but will delay this for a couple of weeks after his restart his diuretic. 4. Anticoagulation: No bleeding complications.  Monitor renal function.  May need to adjust his dose of Xarelto.  His baseline creatinine seems to be about 1.3, but recently his creatinine was up to 1.8. 5. HTN: Well-controlled.  Needs to stop the a separate prescription for losartan since he is on full dose Entresto. 6. CMP: Although he has never had angiography, he most likely has nonischemic cardiomyopathy due to poorly treated hypertension.  Even though a remote nuclear stress test showed an anterior defect, the echo shows global left ventricular hypokinesis and he does not have any Q waves on ECG.  He has never had angina pectoris.  His LVEF seems to vary in direct relationship to the success of blood pressure control.  I suspect he has primarily nonischemic cardiomyopathy. 7. OSA: He reports compliance with CPAP and he denies any problems with daytime hypersomnolence. 8. DM: Poorly controlled with deteriorating hemoglobin A1c now at 11.1%.  He has retinopathy and probably has nephropathy. 9. CKD 3a: We will recheck labs in 1 or 2 weeks.. 10. Obesity: Strongly encourage attempts at weight loss over the long-term.  Spent some time discussing the difference between renal weights and fluid weight related to heart failure exacerbation. 11. HLP: He is now taking both atorvastatin and Caduet.  Stop a separate prescription for atorvastatin.  Recheck lipids with his upcoming labs.  He has a complicated medical condition and is on a very complicated list of medications with varying effects his metabolic profile.  It is important for him to be seen in the clinic frequently, especially since he is still has limited understanding of the purpose of some of his medications.  Also important recheck his renal function and electrolytes  frequently.   Medication Adjustments/Labs and Tests Ordered: Current medicines are reviewed at length with the patient today.  Concerns regarding medicines are outlined above.  Medication changes, Labs and Tests ordered today are listed in the Patient Instructions below. Patient Instructions  Medication Instructions:  STOP the Atorvastatin STOP the Losartan  *If you need a refill on your cardiac medications before your next appointment, please call your pharmacy*   Lab Work: None ordered If you have labs (blood work) drawn today and your tests are completely normal, you will receive your results only by: [name] MyChart Message (if you have MyChart) OR . A paper copy in the mail If you have any lab test that is abnormal or we need to change your treatment, we will call you to review the results.   Testing/Procedures: None ordered   Follow-Up: At [facility], you and your health needs are our priority.  As part of our continuing mission to provide you with exceptional heart care, we have created designated Provider Care Teams.  These Care Teams include your primary Cardiologist (physician) and Advanced Practice Providers (APPs -  Physician Assistants and Nurse Practitioners) who all work together to provide you with the care you need, when you need it.  We recommend signing up for the patient portal called "MyChart".  Sign up information is provided on  this After Visit Summary.  MyChart is used to connect with patients for Virtual Visits (Telemedicine).  Patients are able to view lab/test results, encounter notes, upcoming appointments, etc.  Non-urgent messages can be sent to your provider as well.   To learn more about what you can do with MyChart, go to [email or web address].    Your next appointment:   3 month(s)  The format for your next appointment:   In Person  Provider:   You may see [name], MD or one of the following Advanced Practice Providers on your designated  Care Team:    Almyra Deforest, PA-C  Fabian Sharp, [place] or   Roby Lofts, [place]    Other Instructions Your physician recommends that you weigh yourself everyday at the same time, on the same scale and with the same amount of clothing. Please keep a record of these weights and bring to your next appointment.  Please have your weight available when Dr. Sallyanne Kuster calls you with the Echo results.     Signed, [name], MD  08/07/2020 6:52 PM    [facility] [address], [place], [place]  [address] Phone: [phone]; Fax: [phone]

## 2020-08-11 ENCOUNTER — Telehealth (HOSPITAL_COMMUNITY): Payer: Self-pay | Admitting: Cardiovascular Disease

## 2020-08-11 ENCOUNTER — Other Ambulatory Visit (HOSPITAL_COMMUNITY): Payer: Medicaid Other

## 2020-08-11 NOTE — Telephone Encounter (Signed)
Just an FYI. We have made several attempts to contact this patient including sending a letter to schedule or reschedule their echocardiogram. We will be removing the patient from the echo [place].  08/11/20 PT NO SHOWED X 2- [facility]  07/17/20 NO SHOW -MAILED LETTER [facility]      Thank you

## 2020-08-12 ENCOUNTER — Other Ambulatory Visit: Payer: Self-pay

## 2020-08-12 ENCOUNTER — Other Ambulatory Visit: Payer: Self-pay | Admitting: Internal Medicine

## 2020-08-12 DIAGNOSIS — E1142 Type 2 diabetes mellitus with diabetic polyneuropathy: Secondary | ICD-10-CM

## 2020-08-12 MED ORDER — CARETOUCH PEN NEEDLES 31G X 6 MM MISC: 1.0000 "pen " | Freq: Every day | 3 refills | Status: DC

## 2020-08-12 MED FILL — UNIFINE PENTIPS 6MM 31G: 31G X 6 MM | 34 days supply | Qty: 100 | Fill #0

## 2020-08-14 MED FILL — XULTOPHY 100 UNIT-3.6MG/ML: 100-3.6 | 12 days supply | Qty: 6 | Fill #5

## 2020-08-15 ENCOUNTER — Telehealth: Payer: Self-pay | Admitting: *Deleted

## 2020-08-15 ENCOUNTER — Telehealth: Payer: Medicaid Other

## 2020-08-15 NOTE — Telephone Encounter (Signed)
  Care Management   Outreach Note  08/15/2020 Name: Terry Parks MRN: [id number] DOB: 24-Oct-1955  Referred by: [name], MD Reason for referral : Care Coordination (DDM, HF, HTN, PAF, HLD, CKD- stage 3, OSA, BPH, blind in r eye, chronic pain lower extremeties, obesity)   An unsuccessful telephone outreach was attempted today. The patient was referred to the case management team for assistance with care management and care coordination.   Follow Up Plan: A HIPAA compliant phone message was left for the patient providing contact information and requesting a return call.  The care management team will reach out to the patient again over the next 7-14 days.   [name] RN, CCM, [facility] RN Care Manager [phone]

## 2020-08-18 ENCOUNTER — Ambulatory Visit: Payer: Medicaid Other | Admitting: *Deleted

## 2020-08-18 DIAGNOSIS — I5042 Chronic combined systolic (congestive) and diastolic (congestive) heart failure: Secondary | ICD-10-CM

## 2020-08-18 DIAGNOSIS — N183 Chronic kidney disease, stage 3 unspecified: Secondary | ICD-10-CM

## 2020-08-18 DIAGNOSIS — E1142 Type 2 diabetes mellitus with diabetic polyneuropathy: Secondary | ICD-10-CM

## 2020-08-18 DIAGNOSIS — E1169 Type 2 diabetes mellitus with other specified complication: Secondary | ICD-10-CM

## 2020-08-18 DIAGNOSIS — I4819 Other persistent atrial fibrillation: Secondary | ICD-10-CM

## 2020-08-18 DIAGNOSIS — N393 Stress incontinence (female) (male): Secondary | ICD-10-CM

## 2020-08-18 DIAGNOSIS — E785 Hyperlipidemia, unspecified: Secondary | ICD-10-CM

## 2020-08-18 NOTE — Patient Instructions (Signed)
Visit Information It was nice speaking with you today. Goals Addressed            This Visit's Progress   . Monitor and Manage My Blood Sugar-Diabetes Type 2       Timeframe:  Long-Range Goal Priority:  High Start Date:        06/03/20                     Expected End Date:       06/03/21                Follow Up Date 09/16/20   - check blood sugar at prescribed times - check blood sugar if I feel it is too high or too low - enter blood sugar readings and medication or insulin into my phone log - take the blood sugar log to all doctor visits - take the blood sugar meter to all doctor visits    Why is this important?    Checking your blood sugar at home helps to keep it from getting very high or very low.   Writing the results in a diary or log helps the doctor know how to care for you.   Your blood sugar log should have the time, date and the results.   Also, write down the amount of insulin or other medicine that you take.   Other information, like what you ate, exercise done and how you were feeling, will also be helpful.     Notes: 08/18/20- fasting CBGs meeting target but Hgb A1C has increased so discussed CGM with patient    . Track and Manage Fluids and Swelling-Heart Failure       Timeframe:  Long-Range Goal Priority:  High Start Date:          01/14/20                   Expected End Date:     01/03/21       Follow Up Date 09/16/20   - call office if I gain more than 2 pounds in one day or 5 pounds in one week - do ankle pumps when sitting - use salt in moderation - watch for swelling in feet, ankles and legs every day - weigh myself daily  - log weight in my phone   Why is this important?    It is important to check your weight daily and watch how much salt and liquids you have.   It will help you to manage your heart failure.    Notes: patient states he is weighing everyday, discussed dry weight goal of <310 lbs    . Track and Manage My Blood  Pressure-Hypertension       Timeframe:  Long-Range Goal Priority:  High Start Date:     06/03/20                        Expected End Date:     06/03/21                  Follow Up Date 09/16/20 - check blood pressure daily - write blood pressure results in my phone log    Why is this important?    You won't feel high blood pressure, but it can still hurt your blood vessels.   High blood pressure can cause heart or kidney problems. It can also cause a stroke.   Making lifestyle  changes like losing a little weight or eating less salt will help.   Checking your blood pressure at home and at different times of the day can help to control blood pressure.   If the doctor prescribes medicine remember to take it the way the doctor ordered.   Call the office if you cannot afford the medicine or if there are questions about it.     Notes: patient checks BP everyday and records in his smart phone; meeting treatment targets       The patient verbalized understanding of instructions, educational materials, and care plan provided today and declined offer to receive copy of patient instructions, educational materials, and care plan.   The care management team will reach out to the patient again over the next 30-60 days.   [name] RN, CCM, [facility] RN Care Manager [phone]

## 2020-08-18 NOTE — Chronic Care Management (AMB) (Addendum)
Care Management    RN Visit Note  08/18/2020 Name: Terry Parks MRN: [id number] DOB: Feb 19, 1956  Subjective: Terry Parks is a 65 y.o. year old male who is a primary care patient of [name], MD. The care management team was consulted for assistance with disease management and care coordination needs.    Engaged with patient by telephone for follow up visit in response to provider referral for case management and/or care coordination services.   Consent to Services:   Terry Parks was given information about Care Management services today including:  1. Care Management services includes personalized support from designated clinical staff supervised by his physician, including individualized plan of care and coordination with other care providers 2. 24/7 contact phone numbers for assistance for urgent and routine care needs. 3. The patient may stop case management services at any time by phone call to the office staff.  Patient agreed to services and consent obtained.   Assessment: Review of patient past medical history, allergies, medications, health status, including review of consultants reports, laboratory and other test data, was performed as part of comprehensive evaluation and provision of chronic care management services.   SDOH (Social Determinants of Health) assessments and interventions performed:    Care Plan  Allergies  Allergen Reactions  . Lisinopril Cough    Outpatient Encounter Medications as of 08/18/2020  Medication Sig  . Accu-Chek Softclix Lancets lancets Check blood sugar three times a day as instructed  . acetaminophen (TYLENOL) 325 MG tablet Take 650 mg by mouth every 6 (six) hours as needed for moderate pain.   [name] amiodarone (PACERONE) 200 MG tablet TAKE 1 TABLET (200 MG TOTAL) BY MOUTH DAILY.  [name] amLODipine-atorvastatin (CADUET) 5-40 MG tablet Take 1 tablet by mouth daily.  . carvedilol (COREG) 25 MG tablet TAKE 1 TABLET (25 MG TOTAL) BY MOUTH 2  (TWO) TIMES DAILY WITH A MEAL.  . DULoxetine (CYMBALTA) 60 MG capsule Take 60 mg by mouth daily.  . Elastic Bandages & Supports (ABDOMINAL BINDER/ELASTIC XL) MISC 1 Units by Does not apply route as needed. (Patient not taking: Reported on 08/07/2020)  . empagliflozin (JARDIANCE) 25 MG TABS tablet TAKE 1 TABLET (25 MG TOTAL) BY MOUTH DAILY BEFORE BREAKFAST.  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 Insulin Degludec-Liraglutide (XULTOPHY) 100-3.6 UNIT-MG/ML SOPN INJECT 50 UNITS INTO THE SKIN DAILY  . Insulin Pen Needle 31G X 6 MM MISC USE AS DIRECTED AT BEDTIME.  . metFORMIN (GLUCOPHAGE-XR) 500 MG 24 hr tablet TAKE 4 TABLETS (2,000 MG TOTAL) BY MOUTH DAILY WITH BREAKFAST.  [name] nystatin (NYSTATIN) powder Apply 1 application topically 3 (three) times daily.  [name] omeprazole (PRILOSEC) 40 MG capsule TAKE 1 CAPSULE BY MOUTH TWICE DAILY BEFORE A MEAL.  . rivaroxaban (XARELTO) 20 MG TABS tablet Take 1 tablet (20 mg total) by mouth daily with breakfast.  . sacubitril-valsartan (ENTRESTO) 97-103 MG TAKE 1 TABLET BY MOUTH 2 (TWO) TIMES DAILY.  [name] spironolactone (ALDACTONE) 50 MG tablet Take 50 mg by mouth daily.  [name] torsemide (DEMADEX) 20 MG tablet TAKE 2 TABLETS (40 MG TOTAL) BY MOUTH DAILY.  . [DISCONTINUED] atorvastatin (LIPITOR) 40 MG tablet Take 1 tablet (40 mg total) by mouth daily.   No facility-administered encounter medications on file as of 08/18/2020.    Patient Active Problem List   Diagnosis Date Noted  . S/P laparoscopic hernia repair 03/24/2020  . History of iron deficiency anemia  12/16/2019  . Anemia 12/16/2019  . History of esophagitis 12/16/2019  . Ventral hernia without obstruction or gangrene 12/16/2019  . Acute on chronic HFrEF (heart failure with reduced ejection fraction) ([place]) 08/11/2019  . 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  . Depression 01/31/2019  . Right hip pain 09/19/2018  . Iron deficiency  anemia 04/28/2018  . Diastasis recti 04/28/2018  . Abdominal hernia 03/10/2018  . Stage 3 chronic kidney disease 02/11/2018  . Healthcare maintenance 02/11/2018  . Physical deconditioning 02/11/2018  . Stress incontinence of urine 01/06/2018  . Pain due to dental caries 11/09/2017  . Prostate cancer ([place]) 10/06/2017  . Bladder mass 07/13/2017  . Persistent atrial fibrillation   . Vitamin D deficiency 06/22/2016  . Neuropathy in diabetes ([place]) 10/07/2015  . Erectile dysfunction 04/17/2014  . Allergic rhinitis 10/19/2012  . Diabetic retinopathy ([place]) 10/04/2012  . BPH   . Type 2 diabetes mellitus with peripheral neuropathy ([place]) 04/08/2008  . Obstructive sleep apnea   . Morbid obesity ([place])   . Hyperlipidemia associated with type 2 diabetes mellitus ([place])   . Hypertension associated with diabetes ([place])   . Chronic combined systolic and diastolic CHF (congestive heart failure) ([place]) 03/01/2006    Conditions to be addressed/monitored: IDDM, HF, HTN, PAF, HLD, CKD- stage 3, OSA, BPH, blind in r eye, chronic pain lower extremeties, obesity, target dry weight < 310 lbs  Care Plan : CCM RN- Heart Failure (Adult), HTN, PAF. CKD  Updates made by Barrington Ellison, RN since 08/18/2020 12:00 AM    Problem: Symptom Exacerbation (Heart Failure), PAF, HTN   Priority: High    Long-Range Goal: Chronic disease states of HTN, HF, and PAF Symptoms and/or Exacerbations Prevented or Minimized   Start Date: 01/14/2020  Expected End Date: 01/13/2021  Recent Progress: On track  Priority: High  Note:   CARE PLAN ENTRY (see longitudinal plan of care for additional care plan information)  Current Barriers:  [name] Knowledge deficit related to basic heart failure, HTN, PAF pathophysiology and self care management- spoke with patient to complete follow up assessment, He continues to record blood sugar, blood pressure and weights in a log in his phone, he says most of blood sugars are <150, reports today's weight  as 305 lbs,  he says he does not elevate his legs even though he knows he should, he reports good medication taking behavior, patient is able to correctly identify the weight gain parameters that would require MD notification Nurse Case Manager Clinical Goal(s):   Over the next 30-90 days, patient will weigh self daily and record  Over the next 30-90 days patient will check blood pressure daily and record  Over the next 30-90  days, patient will verbalize understanding of Heart Failure Action Plan and when to call doctor  Over the next 30-90 days, patient will take all mediations as prescribed  Over the next 30-90 days, patient will notify provider for problems related to HTN, HF and PAF  Over the next 30-90 days patient will attend all scheduled provider appointments   Interventions:  . Basic overview and discussion of pathophysiology of Heart Failure, HTN, and PAF . Reviewed cardiology note of 08/07/20 and ensured he stopped atorvastatin and Losartan . Reiterated importance of good medication behavior and ensured patient is now taking all prescribed medications . Reviewed Heart Failure Action Plan  . Reviewed importance of daily weights and reminded patient of target dry weight of <310 per Dr. Sallyanne Kuster . Reviewed upcoming appointments: 6/3 clinic  appointment, 6/29 cardiology  Patient Self Care Activities:  . Take all medications as prescribed . Weigh daily and record (notify MD with 3 lb weight gain over night or 5 lb in a week) . Follow CHF Action Plan . - call office if I gain more than 2 pounds in one day or 5 pounds in one week . - do ankle pumps when sitting . - use salt in moderation . - watch for swelling in feet, ankles and legs every day . - weigh myself daily compare my dry weight of <310 to current weight and call MD if more than 2 lbs in one day of 5 lbs in one week or weight >310 . check blood pressure daily . - write blood pressure results in my phone log     Care  Plan : CCM RN- Diabetes Type 2 (Adult)  Updates made by Barrington Ellison, RN since 08/18/2020 12:00 AM    Problem: Glycemic Management (Diabetes, Type 2)     Long-Range Goal: Glycemic Management Optimized   Start Date: 07/20/2019  This Visit's Progress: Not on track  Recent Progress: On track  Priority: High  Note:    CARE PLAN ENTRY (see longitudinal plan of care for additional care plan information)  Objective:   Lab Results  Component Value Date   HGBA1C 11.1 (A) 07/18/2020   HGBA1C 9.0 (H) 03/19/2020   HGBA1C 10.1 (A) 12/12/2019   Lab Results  Component Value Date   MICROALBUR 11.3 (H) 04/17/2014   LDLCALC 174 (H) 12/12/2019   CREATININE 1.85 (H) 07/18/2020     Current Barriers:  [name] Knowledge Deficits related to basic Diabetes pathophysiology and self care/management . Knowledge Deficits related to medications used for management of diabetes- spoke with patient to complete follow up assessment, he reports fasting blood sugars are usually less than 150 with 2 recent readings around 90, says he does not understand why his Hgb A1C was higher,   he reports good medication taking behavior, denies hypoglycemia  Case Manager Clinical Goal(s):  Over the next 30-60  days, patient will demonstrate improved adherence to prescribed treatment plan for diabetes self care/management as evidenced by:  . daily monitoring and recording of CBG  . adherence to ADA/ carb modified diet . adherence to prescribed medication regimen  Interventions:  . Reviewed education with patient about basic DM disease process . Reviewed clinic note of 07/18/20 including the results of all labs . Reviewed medications and assessed medication taking behavior . Ensured patient has ample supply of all prescribed medications . Discussed Hgb A1C of 11.1% and the correlation to average blood sugar . Discussed trial of CGM- encouraged patient to call [employer] to see if CGM is covered  benefit . Encouraged patient to call and schedule appointment with [facility] clinic RD, [facility] to apply CGM if his health plan will cover CGM . Advised patient, providing education and rationale, to check CBG at least daily and record, calling provider and/or CCM RN for findings outside established parameters. . Requested patient observe if there is a  correlation in increased LE pain when his blood sugars are running higher . Discussed plans with patient for ongoing care management follow up and provided patient with direct contact information for care management team . Review of patient status, including review of consultants reports, relevant laboratory and other test results, and medications completed.   Patient Self Care Activities:  . UNABLE to independently self manage DM . Self administers oral  medications as prescribed . Self administers insulin as prescribed . Self administers injectable DM medication Xultophy as prescribed . Attends all scheduled provider appointments . Checks blood sugars as prescribed and utilize hyper and hypoglycemia protocol as needed . Adheres to prescribed ADA/carb modified . check blood sugar at prescribed times . - check blood sugar if I feel it is too high or too low . - enter blood sugar readings and medication or insulin into my phone log . - take the blood sugar log to all doctor visits . - take the blood sugar meter to all doctor visits         Care Plan : CCM RN- Chronic Pain (Adult)- lower extremeties  Updates made by Barrington Ellison, RN since 08/18/2020 12:00 AM    Problem: Chronic Pain Management (Chronic Pain)     Long-Range Goal: Chronic Pain Managed   Start Date: 07/15/2020  Expected End Date: 07/15/2021  Recent Progress: On track  Priority: High  Note:   Current Barriers:  [name] Knowledge Deficits related to self-health management of chronic lower extremity pain . Chronic Disease Management support and education needs related to  chronic pain . Care Coordination needs related to allowing massage therapy in a patient with chronic lower extremity pain . Unable to independently manage chronic LE pain- Unable to independently manage chronic LE pain- patient states chronic leg and foot pain continue Clinical Goal(s):  . patient will verbalize understanding of plan for pain management.  and patient will use pharmacological and nonpharmacological pain relief strategies as prescribed.  Interventions:  . Collaboration with [name], MD regarding development and update of comprehensive plan of care as evidenced by provider attestation and co-signature . Pain assessment performed . Medications reviewed . Discussed plans with patient for ongoing care management follow up and provided patient with direct contact information for care management team . Discussed improvement  in DM control will likely result in less pain in legs and feet  Patient Goals/Self Care Activities:  . Will self-administer medications as prescribed . Will attend all scheduled provider appointments . Will call pharmacy for medication refills 7 days prior to needed refill date . Patient will calls provider office for new concerns or questions Follow Up Plan: The care management team will reach out to the patient again over the next 30-60 days.        [name] RN, CCM, [facility] RN Care Manager [phone]

## 2020-08-25 ENCOUNTER — Other Ambulatory Visit (HOSPITAL_COMMUNITY): Payer: Self-pay

## 2020-08-25 MED FILL — Insulin Degludec-Liraglutide Sol Pen-Inj 100-3.6 Unit-MG/ML: SUBCUTANEOUS | 12 days supply | Qty: 6 | Fill #0 | Status: CN

## 2020-09-03 ENCOUNTER — Other Ambulatory Visit (HOSPITAL_COMMUNITY): Payer: Self-pay

## 2020-09-08 ENCOUNTER — Other Ambulatory Visit: Payer: Self-pay | Admitting: Internal Medicine

## 2020-09-08 ENCOUNTER — Other Ambulatory Visit (HOSPITAL_COMMUNITY): Payer: Self-pay

## 2020-09-08 DIAGNOSIS — E1142 Type 2 diabetes mellitus with diabetic polyneuropathy: Secondary | ICD-10-CM

## 2020-09-08 MED FILL — Torsemide Tab 20 MG: ORAL | 30 days supply | Qty: 60 | Fill #0 | Status: AC

## 2020-09-08 NOTE — Telephone Encounter (Signed)
Please call pt back about meds.  

## 2020-09-09 ENCOUNTER — Other Ambulatory Visit (HOSPITAL_COMMUNITY): Payer: Self-pay

## 2020-09-09 ENCOUNTER — Encounter: Payer: Self-pay | Admitting: *Deleted

## 2020-09-10 ENCOUNTER — Other Ambulatory Visit (HOSPITAL_COMMUNITY): Payer: Self-pay

## 2020-09-10 MED ORDER — XULTOPHY 100-3.6 UNIT-MG/ML ~~LOC~~ SOPN
50.0000 [IU] | PEN_INJECTOR | Freq: Every day | SUBCUTANEOUS | 0 refills | Status: DC
  Filled 2020-09-10: qty 45, 90d supply, fill #0
  Filled 2020-10-20: qty 45, 90d supply, fill #1

## 2020-09-11 ENCOUNTER — Other Ambulatory Visit (HOSPITAL_COMMUNITY): Payer: Self-pay

## 2020-09-15 ENCOUNTER — Other Ambulatory Visit (HOSPITAL_COMMUNITY): Payer: Self-pay

## 2020-09-15 MED FILL — Carvedilol Tab 25 MG: ORAL | 90 days supply | Qty: 180 | Fill #0 | Status: AC

## 2020-09-15 MED FILL — Sacubitril-Valsartan Tab 97-103 MG: ORAL | 90 days supply | Qty: 180 | Fill #0 | Status: AC

## 2020-09-15 MED FILL — Omeprazole Cap Delayed Release 40 MG: ORAL | 60 days supply | Qty: 60 | Fill #0 | Status: AC

## 2020-09-15 MED FILL — Metformin HCl Tab ER 24HR 500 MG: ORAL | 90 days supply | Qty: 360 | Fill #0 | Status: AC

## 2020-09-16 ENCOUNTER — Ambulatory Visit: Payer: Medicaid Other | Admitting: *Deleted

## 2020-09-16 DIAGNOSIS — E1142 Type 2 diabetes mellitus with diabetic polyneuropathy: Secondary | ICD-10-CM

## 2020-09-16 DIAGNOSIS — I5042 Chronic combined systolic (congestive) and diastolic (congestive) heart failure: Secondary | ICD-10-CM

## 2020-09-16 DIAGNOSIS — I4819 Other persistent atrial fibrillation: Secondary | ICD-10-CM

## 2020-09-16 DIAGNOSIS — E785 Hyperlipidemia, unspecified: Secondary | ICD-10-CM

## 2020-09-16 DIAGNOSIS — N183 Chronic kidney disease, stage 3 unspecified: Secondary | ICD-10-CM

## 2020-09-16 DIAGNOSIS — E1169 Type 2 diabetes mellitus with other specified complication: Secondary | ICD-10-CM

## 2020-09-16 DIAGNOSIS — N393 Stress incontinence (female) (male): Secondary | ICD-10-CM

## 2020-09-16 NOTE — Chronic Care Management (AMB) (Signed)
  Care Management   Note  09/16/2020 Name: Terry Parks MRN: [id number] DOB: 07/17/1955  Derrich Gaby is enrolled in a Managed Medicaid plan: Yes.  Certificate for renewal of adult incontinence supplies received via fax from [employer] placed in Dr Youlanda Mighty clinic mailbox for signatures.  The care management team will reach out to the patient again over the next 30 days.   [name] RN, CCM, [facility] RN Care Manager [phone]

## 2020-09-19 ENCOUNTER — Ambulatory Visit: Payer: Medicaid Other | Admitting: *Deleted

## 2020-09-19 DIAGNOSIS — E1169 Type 2 diabetes mellitus with other specified complication: Secondary | ICD-10-CM

## 2020-09-19 DIAGNOSIS — I5042 Chronic combined systolic (congestive) and diastolic (congestive) heart failure: Secondary | ICD-10-CM

## 2020-09-19 DIAGNOSIS — E785 Hyperlipidemia, unspecified: Secondary | ICD-10-CM

## 2020-09-19 DIAGNOSIS — E1142 Type 2 diabetes mellitus with diabetic polyneuropathy: Secondary | ICD-10-CM

## 2020-09-19 DIAGNOSIS — N183 Chronic kidney disease, stage 3 unspecified: Secondary | ICD-10-CM

## 2020-09-19 NOTE — Chronic Care Management (AMB) (Signed)
Care Management    RN Visit Note  09/19/2020 Name: Terry Parks MRN: [id number] DOB: Apr 02, 1956  Subjective: Terry Parks is a 65 y.o. year old male who is a primary care patient of Terry Simmonds, MD. The care management team was consulted for assistance with disease management and care coordination needs.    Engaged with patient by telephone for follow up visit in response to provider referral for case management and/or care coordination services.   Consent to Services:   Terry Parks was given information about Care Management services today including:  1. Care Management services includes personalized support from designated clinical staff supervised by his physician, including individualized plan of care and coordination with other care providers 2. 24/7 contact phone numbers for assistance for urgent and routine care needs. 3. The patient may stop case management services at any time by phone call to the office staff.  Patient agreed to services and consent obtained.   Assessment: Review of patient past medical history, allergies, medications, health status, including review of consultants reports, laboratory and other test data, was performed as part of comprehensive evaluation and provision of chronic care management services.   SDOH (Social Determinants of Health) assessments and interventions performed:    Care Plan  Allergies  Allergen Reactions  . Lisinopril Cough    Outpatient Encounter Medications as of 09/19/2020  Medication Sig  . Accu-Chek Softclix Lancets lancets Check blood sugar three times a day as instructed  . acetaminophen (TYLENOL) 325 MG tablet Take 650 mg by mouth every 6 (six) hours as needed for moderate pain.   [name] amiodarone (PACERONE) 200 MG tablet TAKE 1 TABLET (200 MG TOTAL) BY MOUTH DAILY.  [name] amLODipine-atorvastatin (CADUET) 5-40 MG tablet Take 1 tablet by mouth daily.  . carvedilol (COREG) 25 MG tablet TAKE 1 TABLET (25 MG TOTAL) BY MOUTH 2  (TWO) TIMES DAILY WITH A MEAL.  . DULoxetine (CYMBALTA) 60 MG capsule Take 60 mg by mouth daily.  . Elastic Bandages & Supports (ABDOMINAL BINDER/ELASTIC XL) MISC 1 Units by Does not apply route as needed. (Patient not taking: Reported on 08/07/2020)  . empagliflozin (JARDIANCE) 25 MG TABS tablet TAKE 1 TABLET (25 MG TOTAL) BY MOUTH DAILY BEFORE BREAKFAST.  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 Insulin Degludec-Liraglutide (XULTOPHY) 100-3.6 UNIT-MG/ML SOPN Inject 50 Units into the skin daily.  . Insulin Pen Needle 31G X 6 MM MISC USE AS DIRECTED AT BEDTIME.  . metFORMIN (GLUCOPHAGE-XR) 500 MG 24 hr tablet TAKE 4 TABLETS (2,000 MG TOTAL) BY MOUTH DAILY WITH BREAKFAST.  [name] nystatin (NYSTATIN) powder Apply 1 application topically 3 (three) times daily.  [name] omeprazole (PRILOSEC) 40 MG capsule TAKE 1 CAPSULE BY MOUTH TWICE DAILY BEFORE A MEAL.  . rivaroxaban (XARELTO) 20 MG TABS tablet Take 1 tablet (20 mg total) by mouth daily with breakfast.  . sacubitril-valsartan (ENTRESTO) 97-103 MG TAKE 1 TABLET BY MOUTH 2 (TWO) TIMES DAILY.  [name] spironolactone (ALDACTONE) 50 MG tablet Take 50 mg by mouth daily.  [name] torsemide (DEMADEX) 20 MG tablet TAKE 2 TABLETS (40 MG TOTAL) BY MOUTH DAILY.  . [DISCONTINUED] atorvastatin (LIPITOR) 40 MG tablet Take 1 tablet (40 mg total) by mouth daily.   No facility-administered encounter medications on file as of 09/19/2020.    Patient Active Problem List   Diagnosis Date Noted  . S/P laparoscopic hernia repair 03/24/2020  . History of iron deficiency anemia  12/16/2019  . Anemia 12/16/2019  . History of esophagitis 12/16/2019  . Ventral hernia without obstruction or gangrene 12/16/2019  . Acute on chronic HFrEF (heart failure with reduced ejection fraction) ([place]) 08/11/2019  . 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  . Depression 01/31/2019  . Right hip pain 09/19/2018  . Iron deficiency  anemia 04/28/2018  . Diastasis recti 04/28/2018  . Abdominal hernia 03/10/2018  . Stage 3 chronic kidney disease 02/11/2018  . Healthcare maintenance 02/11/2018  . Physical deconditioning 02/11/2018  . Stress incontinence of urine 01/06/2018  . Pain due to dental caries 11/09/2017  . Prostate cancer ([place]) 10/06/2017  . Bladder mass 07/13/2017  . Persistent atrial fibrillation   . Vitamin D deficiency 06/22/2016  . Neuropathy in diabetes ([place]) 10/07/2015  . Erectile dysfunction 04/17/2014  . Allergic rhinitis 10/19/2012  . Diabetic retinopathy ([place]) 10/04/2012  . BPH   . Type 2 diabetes mellitus with peripheral neuropathy ([place]) 04/08/2008  . Obstructive sleep apnea   . Morbid obesity ([place])   . Hyperlipidemia associated with type 2 diabetes mellitus ([place])   . Hypertension associated with diabetes ([place])   . Chronic combined systolic and diastolic CHF (congestive heart failure) ([place]) 03/01/2006    Conditions to be addressed/monitored: IDDM, HF, HTN, PAF, HLD, CKD- stage 3, OSA, BPH, blind in r eye, chronic pain lower extremeties, obesity  Care Plan : CCM RN- Heart Failure (Adult), HTN, PAF. CKD  Updates made by Barrington Ellison, RN since 09/19/2020 12:00 AM    Problem: Symptom Exacerbation (Heart Failure), PAF, HTN   Priority: High    Long-Range Goal: Chronic disease states of HTN, HF, and PAF Symptoms and/or Exacerbations Prevented or Minimized   Start Date: 01/14/2020  Expected End Date: 01/13/2021  Recent Progress: On track  Priority: High  Note:   CARE PLAN ENTRY (see longitudinal plan of care for additional care plan information)  Current Barriers:  [name] Knowledge deficit related to basic heart failure, HTN, PAF pathophysiology and self care management- spoke with patient to complete follow up assessment, He continues to record blood sugar, blood pressure and weights in a log in his phone, he says most of blood sugars are <150, reports today's weight as 305 lbs,  he says he does  not elevate his legs even though he knows he should, he reports good medication taking behavior, patient is able to correctly identify the weight gain parameters that would require MD notification Nurse Case Manager Clinical Goal(s):   Over the next 30-90 days, patient will weigh self daily and record  Over the next 30-90 days patient will check blood pressure daily and record  Over the next 30-90  days, patient will verbalize understanding of Heart Failure Action Plan and when to call doctor  Over the next 30-90 days, patient will take all mediations as prescribed  Over the next 30-90 days, patient will notify provider for problems related to HTN, HF and PAF  Over the next 30-90 days patient will attend all scheduled provider appointments   Interventions:  . Basic overview and discussion of pathophysiology of Heart Failure, HTN, and PAF . Reiterated importance of good medication behavior and ensured patient is now taking all prescribed medications . Reviewed Heart Failure Action Plan  . Reviewed importance of daily weights and reminded patient of target dry weight of <310 per Dr. Sallyanne Kuster . Reviewed upcoming appointments: 6/3 clinic appointment, 6/29 cardiology . Explained to patient that his chronic care will be transitioned to a managed Medicaid team,  a team comprised of people who are trained on all the extra benefits and resources available to him through his managed Medicaid  plan.  Patient Self Care Activities:  . Take all medications as prescribed . Weigh daily and record (notify MD with 3 lb weight gain over night or 5 lb in a week) . Follow CHF Action Plan . - call office if I gain more than 2 pounds in one day or 5 pounds in one week . - do ankle pumps when sitting . - use salt in moderation . - watch for swelling in feet, ankles and legs every day . - weigh myself daily compare my dry weight of <310 to current weight and call MD if more than 2 lbs in one day of 5 lbs in one  week or weight >310 . check blood pressure daily . - write blood pressure results in my phone log     Care Plan : CCM RN- Diabetes Type 2 (Adult)  Updates made by Barrington Ellison, RN since 09/19/2020 12:00 AM    Problem: Glycemic Management (Diabetes, Type 2)     Long-Range Goal: Glycemic Management Optimized   Start Date: 07/20/2019  Recent Progress: Not on track  Priority: High  Note:    CARE PLAN ENTRY (see longitudinal plan of care for additional care plan information)  Objective:   Lab Results  Component Value Date   HGBA1C 11.1 (A) 07/18/2020   HGBA1C 9.0 (H) 03/19/2020   HGBA1C 10.1 (A) 12/12/2019   Lab Results  Component Value Date   MICROALBUR 11.3 (H) 04/17/2014   LDLCALC 174 (H) 12/12/2019   CREATININE 1.85 (H) 07/18/2020     Current Barriers:  [name] Knowledge Deficits related to basic Diabetes pathophysiology and self care/management . Knowledge Deficits related to medications used for management of diabetes- spoke with patient to complete follow up assessment, he reports fasting blood sugars are usually less than 150 with 2 recent readings around 90, says he does not understand why his Hgb A1C was higher,   he reports good medication taking behavior, denies hypoglycemia, says he wants to try the [facility] and will discuss with his provider during June clinic appointment   Case Manager Clinical Goal(s):  Over the next 30-60  days, patient will demonstrate improved adherence to prescribed treatment plan for diabetes self care/management as evidenced by:  . daily monitoring and recording of CBG  . adherence to ADA/ carb modified diet . adherence to prescribed medication regimen  Interventions:  . Reviewed education with patient about basic DM disease process . Reviewed clinic note of 07/18/20 including the results of all labs . Reviewed medications and assessed medication taking behavior . Ensured patient has ample supply of all prescribed  medications . Discussed Hgb A1C of 11.1% and the correlation to average blood sugar . Again discussed  trial and benefits of CGM, advised patient his [employer] covers both the [employer]  and the Dexcom CGM . Advised patient, providing education and rationale, to check CBG at least daily and record, calling provider and/or CCM RN for findings outside established parameters. . Educated patient that leg and foot pain will likely decrease if blood sugar control is improved and a CGM could provide that assistance  . Explained to patient that his chronic care will be transitioned to a managed Medicaid team, a team comprised of people who are trained on all the extra benefits and resources available to him through his managed Medicaid  plan. . Discussed plans with patient for ongoing care management follow up and provided patient with direct contact information for care management team . Review of patient status, including review of consultants reports, relevant laboratory and other test results, and medications completed.   Patient Self Care Activities:  . UNABLE to independently self manage DM . Self administers oral medications as prescribed . Self administers insulin as prescribed . Self administers injectable DM medication Xultophy as prescribed . Attends all scheduled provider appointments . Checks blood sugars as prescribed and utilize hyper and hypoglycemia protocol as needed . Adheres to prescribed ADA/carb modified . check blood sugar at prescribed times . - check blood sugar if I feel it is too high or too low . - enter blood sugar readings and medication or insulin into my phone log . - take the blood sugar log to all doctor visits . - take the blood sugar meter to all doctor visits         Care Plan : CCM RN- Chronic Pain (Adult)- lower extremeties  Updates made by Barrington Ellison, RN since 09/19/2020 12:00 AM    Problem: Chronic Pain Management (Chronic  Pain)     Long-Range Goal: Chronic Pain Managed   Start Date: 07/15/2020  Expected End Date: 07/15/2021  Recent Progress: On track  Priority: High  Note:   Current Barriers:  [name] Knowledge Deficits related to self-health management of chronic lower extremity pain . Chronic Disease Management support and education needs related to chronic pain . Care Coordination needs related to allowing massage therapy in a patient with chronic lower extremity pain . Unable to independently manage chronic LE pain- Unable to independently manage chronic LE pain- patient states chronic leg and foot pain continue Clinical Goal(s):  . patient will verbalize understanding of plan for pain management.  and patient will use pharmacological and nonpharmacological pain relief strategies as prescribed.  Interventions:  . Collaboration with [name], MD regarding development and update of comprehensive plan of care as evidenced by provider attestation and co-signature . Pain assessment performed . Medications reviewed . Explained to patient that his chronic care will be transitioned to a managed Medicaid team, a team comprised of people who are trained on all the extra benefits and resources available to him through his managed Medicaid  plan. . Discussed plans with patient for ongoing care management follow up and provided patient with direct contact information for care management team . Discussed improvement  in DM control will likely result in less pain in legs and feet  Patient Goals/Self Care Activities:  . Will self-administer medications as prescribed . Will attend all scheduled provider appointments . Will call pharmacy for medication refills 7 days prior to needed refill date . Patient will calls provider office for new concerns or questions Follow Up Plan: The care management team will reach out to the patient again over the next 30-60 days.        Plan: The care management team will reach out to the  patient again over the next 30-60 days.  [name] RN, CCM, [facility] RN Care Manager [phone]

## 2020-09-19 NOTE — Patient Instructions (Signed)
Visit Information It was nice speaking with you today. Goals Addressed              This Visit's Progress     Patient Stated   .  COMPLETED: "I've got to have my teeth worked on, they are in bad shape." (pt-stated)        [place] (see longitudinal plan of care for additional care plan information)  Current Barriers:  [name] Knowledge Deficits related to securing dental care that accepts Medicaid  Nurse Case Manager Clinical Goal(s):  [name] Over the next 30 days, patient will work with chronic care management team to address needs related to making dental appointment- Patient states he'd like to be seen by a dentist before 6/24 but he accidentally deleted the email community care guide Jill Alexanders sent him with the [facility] dental providers.   Interventions:  . Inquired if patient has located dental provider.  Patient has appointment at [facility] on 11/08/19.   Nash Dimmer with community care guide Jill Alexanders and asked her to e-mail patient another copy of the Medicaid dental providers in [facility].  . Patient Self Care Activities:  . Patient verbalizes understanding of plan to work with chronic care team and provider for medication refills and name of dentist he was referred to in the past . Self administers medications as prescribed . Attends all scheduled provider appointments . Calls pharmacy for medication refills . Unable to independently recall name of dentist he saw in the past and refill Metformin  Please see past updates related to this goal by clicking on the "Past Updates" button in the selected goal        Other   .  Monitor and Manage My Blood Sugar-Diabetes Type 2        Timeframe:  Long-Range Goal Priority:  High Start Date:        06/03/20                     Expected End Date:       06/03/21                Follow Up Date 11/13/20   - check blood sugar at prescribed times - check blood sugar if I feel it is too high  or too low - enter blood sugar readings and medication or insulin into my phone log - take the blood sugar log to all doctor visits - take the blood sugar meter to all doctor visits    Why is this important?    Checking your blood sugar at home helps to keep it from getting very high or very low.   Writing the results in a diary or log helps the doctor know how to care for you.   Your blood sugar log should have the time, date and the results.   Also, write down the amount of insulin or other medicine that you take.   Other information, like what you ate, exercise done and how you were feeling, will also be helpful.     Notes:- most fasting CBGs meeting target but Hgb A1C has increased so discussed CGM with patient    .  Track and Manage Fluids and Swelling-Heart Failure        Timeframe:  Long-Range Goal Priority:  High Start Date:          01/14/20  Expected End Date:     01/03/21       Follow Up Date 11/13/20   - call office if I gain more than 2 pounds in one day or 5 pounds in one week - do ankle pumps when sitting - use salt in moderation - watch for swelling in feet, ankles and legs every day - weigh myself daily  - log weight in my phone   Why is this important?    It is important to check your weight daily and watch how much salt and liquids you have.   It will help you to manage your heart failure.    Notes: patient states he is weighing everyday, discussed dry weight goal of <310 lbs    .  Track and Manage My Blood Pressure-Hypertension        Timeframe:  Long-Range Goal Priority:  High Start Date:     06/03/20                        Expected End Date:     06/03/21                  Follow Up Date 11/13/20 - check blood pressure daily - write blood pressure results in my phone log    Why is this important?    You won't feel high blood pressure, but it can still hurt your blood vessels.   High blood pressure can cause heart or kidney  problems. It can also cause a stroke.   Making lifestyle changes like losing a little weight or eating less salt will help.   Checking your blood pressure at home and at different times of the day can help to control blood pressure.   If the doctor prescribes medicine remember to take it the way the doctor ordered.   Call the office if you cannot afford the medicine or if there are questions about it.     Notes: patient checks BP everyday and records in his smart phone; meeting treatment targets       The patient verbalized understanding of instructions, educational materials, and care plan provided today and declined offer to receive copy of patient instructions, educational materials, and care plan.   The care management team will reach out to the patient again over the next 30-60 days.   [name] RN, CCM, [facility] RN Care Manager [phone]

## 2020-09-29 ENCOUNTER — Telehealth: Payer: Self-pay

## 2020-09-29 ENCOUNTER — Ambulatory Visit: Payer: Medicaid Other | Admitting: *Deleted

## 2020-09-29 DIAGNOSIS — E785 Hyperlipidemia, unspecified: Secondary | ICD-10-CM

## 2020-09-29 DIAGNOSIS — I5042 Chronic combined systolic (congestive) and diastolic (congestive) heart failure: Secondary | ICD-10-CM

## 2020-09-29 DIAGNOSIS — N393 Stress incontinence (female) (male): Secondary | ICD-10-CM

## 2020-09-29 DIAGNOSIS — E1169 Type 2 diabetes mellitus with other specified complication: Secondary | ICD-10-CM

## 2020-09-29 DIAGNOSIS — N183 Chronic kidney disease, stage 3 unspecified: Secondary | ICD-10-CM

## 2020-09-29 DIAGNOSIS — I4819 Other persistent atrial fibrillation: Secondary | ICD-10-CM

## 2020-09-29 DIAGNOSIS — E1142 Type 2 diabetes mellitus with diabetic polyneuropathy: Secondary | ICD-10-CM

## 2020-09-29 NOTE — Telephone Encounter (Signed)
Pls contact pt [phone]

## 2020-09-29 NOTE — Chronic Care Management (AMB) (Addendum)
  Care Management   Note  09/29/2020 Name: Terry Parks MRN: [id number] DOB: 12-Aug-1955  Terry Parks is enrolled in a Managed Medicaid plan: Yes. Outreach attempt today was successful.   Signed renewal orders for incontinence supplies and most recent clinic notes successfully faxed to [employer] at [phone]. Also left message at [employer] for incontinence/urology representative at phone number [phone] at 6:00 pm 09/29/20.  The Managed Medicaid  team will reach out to the patient again over the next 30 days.   [name] RN, CCM, [facility] RN Care Manager [phone]

## 2020-09-30 ENCOUNTER — Ambulatory Visit: Payer: Medicaid Other | Admitting: *Deleted

## 2020-09-30 DIAGNOSIS — N183 Chronic kidney disease, stage 3 unspecified: Secondary | ICD-10-CM

## 2020-09-30 DIAGNOSIS — E1169 Type 2 diabetes mellitus with other specified complication: Secondary | ICD-10-CM

## 2020-09-30 DIAGNOSIS — I5042 Chronic combined systolic (congestive) and diastolic (congestive) heart failure: Secondary | ICD-10-CM

## 2020-09-30 DIAGNOSIS — I4819 Other persistent atrial fibrillation: Secondary | ICD-10-CM

## 2020-09-30 DIAGNOSIS — N393 Stress incontinence (female) (male): Secondary | ICD-10-CM

## 2020-09-30 DIAGNOSIS — E1142 Type 2 diabetes mellitus with diabetic polyneuropathy: Secondary | ICD-10-CM

## 2020-09-30 NOTE — Progress Notes (Signed)
Internal Medicine Clinic Resident  I have personally reviewed this encounter including the documentation in this note and/or discussed this patient with the care management provider. I will address any urgent items identified by the care management provider and will communicate my actions to the patient's PCP. I have reviewed the patient's CCM visit with my supervising attending, Dr Hoffman.  Jeffrey Sanjuan Sawa, MD 09/30/2020    

## 2020-09-30 NOTE — Chronic Care Management (AMB) (Signed)
  Care Management   Note  09/30/2020 Name: [name] MRN: [id number] DOB: [birth year]  Terry Parks is enrolled in a Managed Medicaid plan: Yes.  Verbal report given to [place]; she will be proving chronic care management services to patient as part of the Managed Medicaid team.  Patient was notified of transition on 09/19/20.   [name] RN, CCM, [facility] RN Care Manager [phone]

## 2020-10-07 ENCOUNTER — Other Ambulatory Visit: Payer: Self-pay | Admitting: Obstetrics and Gynecology

## 2020-10-07 ENCOUNTER — Other Ambulatory Visit: Payer: Self-pay

## 2020-10-07 NOTE — Patient Outreach (Signed)
Medicaid Managed Care   Nurse Care Manager Note  10/07/2020 Name:  Terry Parks MRN:  [id number] DOB:  14-Jun-1955  Terry Parks is an 65 y.o. year old male who is a primary patient of [name], MD.  The [facility] Managed Care Coordination team was consulted for assistance with:    chronic healthcare management needs.  [name] was given information about Medicaid Managed Care Coordination team services today. [name] agreed to services and verbal consent obtained.  Engaged with patient by telephone for initial visit in response to provider referral for case management and/or care coordination services.   Assessments/Interventions:  Review of past medical history, allergies, medications, health status, including review of consultants reports, laboratory and other test data, was performed as part of comprehensive evaluation and provision of chronic care management services.  SDOH (Social Determinants of Health) assessments and interventions performed:   Care Plan  Allergies  Allergen Reactions  . Lisinopril Cough    Medications Reviewed Today    Reviewed by Barrington Ellison, RN (Registered Nurse) on 09/19/20 at [place] List Status: <None>  Medication Order Taking? Sig Documenting Provider Last Dose Status Informant  Accu-Chek Softclix Lancets lancets [id number] No Check blood sugar three times a day as instructed [name], Vahini, MD Taking Active Self  acetaminophen (TYLENOL) 325 MG tablet [id number] No Take 650 mg by mouth every 6 (six) hours as needed for moderate pain.  [provider] Taking Active Self  amiodarone (PACERONE) 200 MG tablet [id number] No TAKE 1 TABLET (200 MG TOTAL) BY MOUTH DAILY. Bloomfield, Carley D, DO Taking Active   amLODipine-atorvastatin (CADUET) 5-40 MG tablet [id number] No Take 1 tablet by mouth daily. [provider] Taking Active         Discontinued 43/15/40 0867 (Duplicate)   carvedilol (COREG) 25 MG tablet  [id number]  TAKE 1 TABLET (25 MG TOTAL) BY MOUTH 2 (TWO) TIMES DAILY WITH A MEAL. [name], [name], MD  Active   DULoxetine (CYMBALTA) 60 MG capsule [id number] No Take 60 mg by mouth daily. [provider] Taking Active   Elastic Bandages & Supports (ABDOMINAL BINDER/ELASTIC XL) MISC [id number] No 1 Units by Does not apply route as needed.  Patient not taking: Reported on 08/07/2020   Mosetta Anis, MD Not Taking Active   empagliflozin (JARDIANCE) 25 MG TABS tablet [id number]  TAKE 1 TABLET (25 MG TOTAL) BY MOUTH DAILY BEFORE BREAKFAST. Mosetta Anis, MD  Active   glucose blood (ACCU-CHEK AVIVA) test strip [id number] No Use to check blood sugar 3 times daily diag code E11.42. insulin dependent Forde Dandy, PharmD Taking Active Self  Insulin Degludec-Liraglutide (XULTOPHY) 100-3.6 UNIT-MG/ML SOPN [id number]  Inject 50 Units into the skin daily. Rehman, Areeg N, DO  Active   Insulin Pen Needle 31G X 6 MM MISC [id number]  USE AS DIRECTED AT BEDTIME. [name], MD  Active   metFORMIN (GLUCOPHAGE-XR) 500 MG 24 hr tablet [id number]  TAKE 4 TABLETS (2,000 MG TOTAL) BY MOUTH DAILY WITH BREAKFAST. Mosetta Anis, MD  Active   nystatin (NYSTATIN) powder [id number] No Apply 1 application topically 3 (three) times daily. Andrew Au, MD Taking Active Self           Med Note Lanna Poche R   Tue Apr 01, 2020 11:00 AM)    omeprazole (PRILOSEC) 40 MG capsule [id number]  TAKE 1 CAPSULE BY MOUTH TWICE DAILY BEFORE A MEAL. Bloomfield, Carley D, DO  Active   rivaroxaban (XARELTO) 20 MG TABS  tablet [id number] No Take 1 tablet (20 mg total) by mouth daily with breakfast. [name], MD Taking Active Self  sacubitril-valsartan (ENTRESTO) 97-103 MG [id number]  TAKE 1 TABLET BY MOUTH 2 (TWO) TIMES DAILY. [name], [name], MD  Active   spironolactone (ALDACTONE) 50 MG tablet [id number] No Take 50 mg by mouth daily. [provider] Taking Active   torsemide (DEMADEX) 20 MG tablet [id number]  TAKE 2  TABLETS (40 MG TOTAL) BY MOUTH DAILY. Madalyn Rob, MD  Active           Patient Active Problem List   Diagnosis Date Noted  . S/P laparoscopic hernia repair 03/24/2020  . History of iron deficiency anemia 12/16/2019  . Anemia 12/16/2019  . History of esophagitis 12/16/2019  . Ventral hernia without obstruction or gangrene 12/16/2019  . Acute on chronic HFrEF (heart failure with reduced ejection fraction) ([place]) 08/11/2019  . 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  . Depression 01/31/2019  . Right hip pain 09/19/2018  . Iron deficiency anemia 04/28/2018  . Diastasis recti 04/28/2018  . Abdominal hernia 03/10/2018  . Stage 3 chronic kidney disease 02/11/2018  . Healthcare maintenance 02/11/2018  . Physical deconditioning 02/11/2018  . Stress incontinence of urine 01/06/2018  . Pain due to dental caries 11/09/2017  . Prostate cancer ([place]) 10/06/2017  . Bladder mass 07/13/2017  . Persistent atrial fibrillation   . Vitamin D deficiency 06/22/2016  . Neuropathy in diabetes ([place]) 10/07/2015  . Erectile dysfunction 04/17/2014  . Allergic rhinitis 10/19/2012  . Diabetic retinopathy ([place]) 10/04/2012  . BPH   . Type 2 diabetes mellitus with peripheral neuropathy ([place]) 04/08/2008  . Obstructive sleep apnea   . Morbid obesity ([place])   . Hyperlipidemia associated with type 2 diabetes mellitus ([place])   . Hypertension associated with diabetes ([place])   . Chronic combined systolic and diastolic CHF (congestive heart failure) ([place]) 03/01/2006    Conditions to be addressed/monitored per PCP order:  chronic healthcare management needs, IDDM, HF, HTN, PAF, HLD, CKD- stage 3, OSA, BPH, blind in r eye, chronic pain lower extremities.  Care Plan : General Plan of Care (Adult)  Updates made by [name], RN since 10/07/2020 12:00 AM    Problem: Health Promotion or Disease Self-Management (General Plan of Care)   Priority: Medium  Onset Date: 10/07/2020    Long-Range Goal: Self-Management Plan  Developed   Start Date: 10/07/2020  Expected End Date: 01/07/2021  This Visit's Progress: Not on track  Priority: Medium  Note:   Current Barriers:   Ineffective Self Health Maintenance  Patient with complaints of leg and foot pain related to neuropathy-states he stopped taking Gabapentin because it was not working-patient would like a referral to a neurologist.  Patient also complaining of incontinence and would like referral to a different urologist.  Currently UNABLE TO independently self manage needs related to chronic health conditions.   Knowledge Deficits related to short term plan for care coordination needs and long term plans for chronic disease management needs Nurse Case Manager Clinical Goal(s):   patient will work with care management team to address care coordination and chronic disease management needs related to Disease Management  Educational Needs  Care Coordination  Medication Management and Education  Medication Reconciliation  Medication Assistance   Psychosocial Support   Interventions:   Evaluation of current treatment plan and patient's adherence to plan as established by provider.  Reviewed medications with patient.  Collaborated with pharmacy regarding referrals.  Collaborated with PCP for  referrals.  Discussed plans with patient for ongoing care management follow up and provided patient with direct contact information for care management team  Advised patient, providing education and rationale, to monitor blood pressure daily and record, calling provider for findings outside established parameters.   Reviewed scheduled/upcoming provider appointments.  Advised patient, providing education and rationale, to check cbg and record, calling provider for findings outside established parameters.    Pharmacy referral for medication review. Self Care Activities:  . Patient will self administer medications as prescribed . Patient will attend all  scheduled provider appointments . Patient will call pharmacy for medication refills . Patient will continue to perform ADL's independently . Patient will call provider office for new concerns or questions Patient Goals: In the next 30 days, patient will meet with Pharmacist for medication review. In the next 30 days, patient will receive referrals from PCP for neurologist and urologist. - Follow Up Plan: The patient has been provided with contact information for the care management team and has been advised to call with any health related questions or concerns.  The care management team will reach out to the patient again over the next 30 days.    Evidence-based guidance:    Review biopsychosocial determinants of health screens.   Review need for preventive screening based on age, sex, family history and health history.   Determine level of modifiable health risk.   Discuss identified risks.   Identify areas where behavior change may lead to improved health.   Promote healthy lifestyle.   Evoke change talk using open-ended questions, pros and cons, as well as looking forward.   Identify and manage conditions or preconditions to reduce health risk.   Implement additional goals and interventions based on identified risk factors.            Follow Up:  Patient agrees to Care Plan and Follow-up.  Plan: The Managed Medicaid care management team will reach out to the patient again over the next 30 days. and The patient has been provided with contact information for the Managed Medicaid care management team and has been advised to call with any health related questions or concerns.  Date/time of next scheduled RN care management/care coordination outreach:  11/06/20 at 1030.

## 2020-10-07 NOTE — Patient Instructions (Signed)
Hi Terry Parks, thank you for speaking with me today.  Mr. [name] was given information about Medicaid Managed Care team care coordination services as a part of their Healthy [facility] Medicaid benefit. Terry Parks verbally consented to engagement with the Terry Parks Managed Care team.   For questions related to your [name] health plan, please call: [phone] or visit the homepage here: [email or web address]  If you would like to schedule transportation through your Healthy [facility] plan, please call the following number at least 2 days in advance of your appointment: [phone]   Call the [place] at [phone], at any time, 24 hours a day, 7 days a week. If you are in danger or need immediate medical attention call 911.  Terry Parks - following are the goals we discussed in your visit today:  Goals Addressed            This Visit's Progress   . Protect My Health       Timeframe:  Long-Range Goal Priority:  Medium Start Date:      10/07/20                       Expected End Date:     01/07/21                  Follow Up Date 11/07/20   - schedule appointment for flu shot - schedule appointment for vaccines needed due to my age or health - schedule recommended health tests. - schedule and keep appointment for annual check-up    Why is this important?    Screening tests can find diseases early when they are easier to treat.   Your doctor or nurse will talk with you about which tests are important for you.   Getting shots for common diseases like the flu and shingles will help prevent them.         Patient verbalizes understanding of instructions provided today.   The Managed Medicaid care management team will reach out to the patient again over the next 30 days.  The patient has been provided with contact information for the Managed Medicaid care management team and has been advised to call with  any health related questions or concerns.   Terry Raider RN, BSN [place]  [name] - Managed Medicaid High Risk [phone].  Following is a copy of your plan of care:    Patient Care Plan: General Plan of Care (Adult)    Problem Identified: Health Promotion or Disease Self-Management (General Plan of Care)   Priority: Medium  Onset Date: 10/07/2020    Long-Range Goal: Self-Management Plan Developed   Start Date: 10/07/2020  Expected End Date: 01/07/2021  This Visit's Progress: Not on track  Priority: Medium  Note:   Current Barriers:   Ineffective Self Health Maintenance  Patient with complaints of leg and foot pain related to neuropathy-states he stopped taking Gabapentin because it was not working-patient would like a referral to a neurologist.  Patient also complaining of incontinence and would like referral to a different urologist.  Currently UNABLE TO independently self manage needs related to chronic health conditions.   Knowledge Deficits related to short term plan for care coordination needs and long term plans for chronic disease management needs Nurse Case Manager Clinical Goal(s):   patient will work with care management team to address care coordination and chronic disease management needs related to Disease Management  Educational  Needs  Care Coordination  Medication Management and Education  Medication Reconciliation  Medication Assistance   Psychosocial Support   Interventions:   Evaluation of current treatment plan and patient's adherence to plan as established by provider.  Reviewed medications with patient.  Collaborated with pharmacy regarding referrals.  Collaborated with PCP for referrals.  Discussed plans with patient for ongoing care management follow up and provided patient with direct contact information for care management team  Advised patient, providing education and rationale, to  monitor blood pressure daily and record, calling provider for findings outside established parameters.   Reviewed scheduled/upcoming provider appointments.  Advised patient, providing education and rationale, to check cbg and record, calling provider for findings outside established parameters.    Pharmacy referral for medication review. Self Care Activities:  . Patient will self administer medications as prescribed . Patient will attend all scheduled provider appointments . Patient will call pharmacy for medication refills . Patient will continue to perform ADL's independently . Patient will call provider office for new concerns or questions Patient Goals: In the next 30 days, patient will meet with Pharmacist for medication review. In the next 30 days, patient will receive referrals from PCP for neurologist and urologist. - Follow Up Plan: The patient has been provided with contact information for the care management team and has been advised to call with any health related questions or concerns.  The care management team will reach out to the patient again over the next 30 days.    Evidence-based guidance:    Review biopsychosocial determinants of health screens.   Review need for preventive screening based on age, sex, family history and health history.   Determine level of modifiable health risk.   Discuss identified risks.   Identify areas where behavior change may lead to improved health.   Promote healthy lifestyle.   Evoke change talk using open-ended questions, pros and cons, as well as looking forward.   Identify and manage conditions or preconditions to reduce health risk.   Implement additional goals and interventions based on identified risk factors.

## 2020-10-08 ENCOUNTER — Other Ambulatory Visit: Payer: Self-pay

## 2020-10-08 NOTE — Patient Instructions (Signed)
Visit Information  Terry Parks was given information about Medicaid Managed Care team care coordination services as a part of their Healthy [facility] Medicaid benefit. Terry Parks verbally consented to engagement with the [facility] Managed Care team.   For questions related to your Healthy [facility] health plan, please call: [phone] or visit the homepage here: [email or web address]  If you would like to schedule transportation through your Healthy [facility] plan, please call the following number at least 2 days in advance of your appointment: [phone]   Call the [place] at [phone], at any time, 24 hours a day, 7 days a week. If you are in danger or need immediate medical attention call 911.  Terry Parks - following are the goals we discussed in your visit today:  Goals Addressed            This Visit's Progress   . Manage My Medicine       Timeframe:  Short-Term Goal Priority:  High Start Date:                             Expected End Date:                       Follow Up Date Monthly    - call for medicine refill 2 or 3 days before it runs out - call if I am sick and can't take my medicine - keep a list of all the medicines I take; vitamins and herbals too - use a pillbox to sort medicine    Why is this important?   . These steps will help you keep on track with your medicines.   Notes:        Please see education materials related to DM provided as print materials.   Patient verbalizes understanding of instructions provided today.   The Managed Medicaid care management team will reach out to the patient again over the next 30 days.   Terry Parks, Pharm.D., Managed Medicaid Pharmacist [phone]   Following is a copy of your plan of care:  Patient Care Plan: RN Care Manager  Completed 06/03/2020  Problem Identified: Inquinal Hernia Repair TOC. Resolved 06/03/2020  Priority: High   Note:   . Chronic Disease Management support, education, and care coordination needs related to IP event on 11//8/21-11/9/21 for Inguinal Hernia Repair  . Clinical Goal(s) related to Inguinal Hernia Repair   Over the next 14 days, patient will:  . Work with the care management team to address educational, disease management, and care coordination needs  . Call provider office for new or worsened signs and symptoms  . Call care management team with questions or concerns . Verbalize basic understanding of patient centered plan of care established today  Interventions related to Inguinal Hernia Repair:  . Evaluation of current treatment plans and patient's adherence to plan as established by provider . Assessed patient understanding of disease states- patient asked about the band that he is wearing if he can take it off.  I advised him to call the sugery center for them to advise him what would be appropriate. . Assessed patient's education and care coordination needs . Provided disease specific education to patient  . Collaborated with appropriate clinical care team members regarding patient needs . Medications reviewed-patient states that he did not pick up the pain medication because he does not need it.  He is not in pain  he is just sore.  He did not get the colace or torsemide because he has some at home. He states that he has had a bowel movement since being at home and feeling well.  He is staying hydrated and eating.  He is not eating as much as he usually does because he doesn't have that much of an appetite.  Dicussed Diet and the protein in his diet for the healing process. He verbalized understanding. . Scheduled Appointments Reviewed: Patient know that he has an appointment with Terry Drown NP 04/01/20 at 6 am [address] [place] and Terry Parks. 04/23/20 925 am [place].  He states that he has transportation to the  appointments.  . Patient Goals/Self Care Activities related to Inguinal Hernia Repair .  Patient verbalizes understanding of plan .  Self-administers medications as prescribed . Calls pharmacy for medication refills . Call's provider office for new concerns or questions . Call Surgeons' office concerning question regarding his belly band.  Follow up Plan: The care management team will reach out to the patient again over the next 14 days.    Patient Care Plan: CCM RN- Heart Failure (Adult), HTN, PAF. CKD    Problem Identified: Symptom Exacerbation (Heart Failure), PAF, HTN   Priority: High    Long-Range Goal: Chronic disease states of HTN, HF, and PAF Symptoms and/or Exacerbations Prevented or Minimized   Start Date: 01/14/2020  Expected End Date: 01/13/2021  Recent Progress: On track  Priority: High  Note:   CARE PLAN ENTRY (see longitudinal plan of care for additional care plan information)  Current Barriers:  [name] Knowledge deficit related to basic heart failure, HTN, PAF pathophysiology and self care management- spoke with patient to complete follow up assessment, He continues to record blood sugar, blood pressure and weights in a log in his phone, he says most of blood sugars are <150, reports today's weight as 305 lbs,  he says he does not elevate his legs even though he knows he should, he reports good medication taking behavior, patient is able to correctly identify the weight gain parameters that would require MD notification Nurse Case Manager Clinical Goal(s):   Over the next 30-90 days, patient will weigh self daily and record  Over the next 30-90 days patient will check blood pressure daily and record  Over the next 30-90  days, patient will verbalize understanding of Heart Failure Action Plan and when to call doctor  Over the next 30-90 days, patient will take all mediations as prescribed  Over the next 30-90 days, patient will notify provider for problems related to  HTN, HF and PAF  Over the next 30-90 days patient will attend all scheduled provider appointments   Interventions:  . Basic overview and discussion of pathophysiology of Heart Failure, HTN, and PAF . Reiterated importance of good medication behavior and ensured patient is now taking all prescribed medications . Reviewed Heart Failure Action Plan  . Reviewed importance of daily weights and reminded patient of target dry weight of <310 per Terry. Sallyanne Parks . Reviewed upcoming appointments: 6/3 clinic appointment, 6/29 cardiology . Explained to patient that his chronic care will be transitioned to a managed Medicaid team, a team comprised of people who are trained on all the extra benefits and resources available to him through his managed Medicaid  plan.  Patient Self Care Activities:  . Take all medications as prescribed . Weigh daily and record (notify MD  with 3 lb weight gain over night or 5 lb in a week) . Follow CHF Action Plan . - call office if I gain more than 2 pounds in one day or 5 pounds in one week . - do ankle pumps when sitting . - use salt in moderation . - watch for swelling in feet, ankles and legs every day . - weigh myself daily compare my dry weight of <310 to current weight and call MD if more than 2 lbs in one day of 5 lbs in one week or weight >310 . check blood pressure daily . - write blood pressure results in my phone log     Patient Care Plan: CCM RN- Diabetes Type 2 (Adult)    Problem Identified: Glycemic Management (Diabetes, Type 2)     Long-Range Goal: Glycemic Management Optimized   Start Date: 07/20/2019  Recent Progress: Not on track  Priority: High  Note:    CARE PLAN ENTRY (see longitudinal plan of care for additional care plan information)  Objective:   Lab Results  Component Value Date   HGBA1C 11.1 (A) 07/18/2020   HGBA1C 9.0 (H) 03/19/2020   HGBA1C 10.1 (A) 12/12/2019   Lab Results  Component Value Date   MICROALBUR 11.3 (H) 04/17/2014    LDLCALC 174 (H) 12/12/2019   CREATININE 1.85 (H) 07/18/2020     Current Barriers:  [name] Knowledge Deficits related to basic Diabetes pathophysiology and self care/management . Knowledge Deficits related to medications used for management of diabetes- spoke with patient to complete follow up assessment, he reports fasting blood sugars are usually less than 150 with 2 recent readings around 90, says he does not understand why his Hgb A1C was higher,   he reports good medication taking behavior, denies hypoglycemia, says he wants to try the [facility] and will discuss with his provider during June clinic appointment   Case Manager Clinical Goal(s):  Over the next 30-60  days, patient will demonstrate improved adherence to prescribed treatment plan for diabetes self care/management as evidenced by:  . daily monitoring and recording of CBG  . adherence to ADA/ carb modified diet . adherence to prescribed medication regimen  Interventions:  . Reviewed education with patient about basic DM disease process . Reviewed clinic note of 07/18/20 including the results of all labs . Reviewed medications and assessed medication taking behavior . Ensured patient has ample supply of all prescribed medications . Discussed Hgb A1C of 11.1% and the correlation to average blood sugar . Again discussed  trial and benefits of CGM, advised patient his Healthy [employer] covers both the [employer]  and the Dexcom CGM . Advised patient, providing education and rationale, to check CBG at least daily and record, calling provider and/or CCM RN for findings outside established parameters. . Educated patient that leg and foot pain will likely decrease if blood sugar control is improved and a CGM could provide that assistance  . Explained to patient that his chronic care will be transitioned to a managed Medicaid team, a team comprised of people who are trained on all the extra benefits and resources  available to him through his managed Medicaid  plan. . Discussed plans with patient for ongoing care management follow up and provided patient with direct contact information for care management team . Review of patient status, including review of consultants reports, relevant laboratory and other test results, and medications completed.   Patient Self Care Activities:  . UNABLE to independently self  manage DM . Self administers oral medications as prescribed . Self administers insulin as prescribed . Self administers injectable DM medication Xultophy as prescribed . Attends all scheduled provider appointments . Checks blood sugars as prescribed and utilize hyper and hypoglycemia protocol as needed . Adheres to prescribed ADA/carb modified . check blood sugar at prescribed times . - check blood sugar if I feel it is too high or too low . - enter blood sugar readings and medication or insulin into my phone log . - take the blood sugar log to all doctor visits . - take the blood sugar meter to all doctor visits         Patient Care Plan: CCM RN- Chronic Pain (Adult)- lower extremeties    Problem Identified: Chronic Pain Management (Chronic Pain)     Long-Range Goal: Chronic Pain Managed   Start Date: 07/15/2020  Expected End Date: 07/15/2021  Recent Progress: On track  Priority: High  Note:   Current Barriers:  [name] Knowledge Deficits related to self-health management of chronic lower extremity pain . Chronic Disease Management support and education needs related to chronic pain . Care Coordination needs related to allowing massage therapy in a patient with chronic lower extremity pain . Unable to independently manage chronic LE pain- Unable to independently manage chronic LE pain- patient states chronic leg and foot pain continue Clinical Goal(s):  . patient will verbalize understanding of plan for pain management.  and patient will use pharmacological and nonpharmacological pain  relief strategies as prescribed.  Interventions:  . Collaboration with Terry Beard, MD regarding development and update of comprehensive plan of care as evidenced by provider attestation and co-signature . Pain assessment performed . Medications reviewed . Explained to patient that his chronic care will be transitioned to a managed Medicaid team, a team comprised of people who are trained on all the extra benefits and resources available to him through his managed Medicaid  plan. . Discussed plans with patient for ongoing care management follow up and provided patient with direct contact information for care management team . Discussed improvement  in DM control will likely result in less pain in legs and feet  Patient Goals/Self Care Activities:  . Will self-administer medications as prescribed . Will attend all scheduled provider appointments . Will call pharmacy for medication refills 7 days prior to needed refill date . Patient will calls provider office for new concerns or questions Follow Up Plan: The care management team will reach out to the patient again over the next 30-60 days.      Patient Care Plan: General Plan of Care (Adult)    Problem Identified: Health Promotion or Disease Self-Management (General Plan of Care)   Priority: Medium  Onset Date: 10/07/2020    Long-Range Goal: Self-Management Plan Developed   Start Date: 10/07/2020  Expected End Date: 01/07/2021  This Visit's Progress: Not on track  Priority: Medium  Note:   Current Barriers:   Ineffective Self Health Maintenance  Patient with complaints of leg and foot pain related to neuropathy-states he stopped taking Gabapentin because it was not working-patient would like a referral to a neurologist.  Patient also complaining of incontinence and would like referral to a different urologist.  Currently UNABLE TO independently self manage needs related to chronic health conditions.   Knowledge Deficits related to  short term plan for care coordination needs and long term plans for chronic disease management needs Nurse Case Manager Clinical Goal(s):   patient will work with care  management team to address care coordination and chronic disease management needs related to Disease Management  Educational Needs  Care Coordination  Medication Management and Education  Medication Reconciliation  Medication Assistance   Psychosocial Support   Interventions:   Evaluation of current treatment plan and patient's adherence to plan as established by provider.  Reviewed medications with patient.  Collaborated with pharmacy regarding referrals.  Collaborated with PCP for referrals.  Discussed plans with patient for ongoing care management follow up and provided patient with direct contact information for care management team  Advised patient, providing education and rationale, to monitor blood pressure daily and record, calling provider for findings outside established parameters.   Reviewed scheduled/upcoming provider appointments.  Advised patient, providing education and rationale, to check cbg and record, calling provider for findings outside established parameters.    Pharmacy referral for medication review. Self Care Activities:  . Patient will self administer medications as prescribed . Patient will attend all scheduled provider appointments . Patient will call pharmacy for medication refills . Patient will continue to perform ADL's independently . Patient will call provider office for new concerns or questions Patient Goals: In the next 30 days, patient will meet with Pharmacist for medication review. In the next 30 days, patient will receive referrals from PCP for neurologist and urologist. - Follow Up Plan: The patient has been provided with contact information for the care management team and has been advised to call with any health related questions or concerns.  The care management  team will reach out to the patient again over the next 30 days.    Evidence-based guidance:    Review biopsychosocial determinants of health screens.   Review need for preventive screening based on age, sex, family history and health history.   Determine level of modifiable health risk.   Discuss identified risks.   Identify areas where behavior change may lead to improved health.   Promote healthy lifestyle.   Evoke change talk using open-ended questions, pros and cons, as well as looking forward.   Identify and manage conditions or preconditions to reduce health risk.   Implement additional goals and interventions based on identified risk factors.     Task: Mutually Develop and Terry Parks Achievement of Patient Goals   Note:   Care Management Activities:    - verbalization of feelings encouraged    Notes:    Patient Care Plan: Medication Management    Problem Identified: Health Promotion or Disease Self-Management (General Plan of Care)     Goal: Medication Management   Note:   Current Barriers:  . Unable to achieve control of DM  . Does not adhere to prescribed medication regimen . Does not maintain contact with provider office . Does not contact provider office for questions/concerns .   Pharmacist Clinical Goal(s):  [name] Over the next 30 days, patient will adhere to prescribed medication regimen as evidenced by understanding of mechanism . contact provider office for questions/concerns as evidenced notation of same in electronic health record through collaboration with PharmD and provider.  .   Interventions: . Inter-disciplinary care team collaboration (see longitudinal plan of care) . Comprehensive medication review performed; medication list updated in electronic medical record  @RXCPDIABETES @ @RXCPHYPERTENSION @ @RXCPHYPERLIPIDEMIA @ @RXCPMENTALHEALTH @  Patient Goals/Self-Care Activities . Over the next 30 days, patient will:  - take medications as  prescribed collaborate with provider on medication access solutions  Follow Up Plan: The care management team will reach out to the patient again over the next 30 days.  Task: Mutually Develop and Terry Parks Achievement of Patient Goals   Note:   Care Management Activities:    - verbalization of feelings encouraged    Notes:

## 2020-10-08 NOTE — Patient Outreach (Addendum)
Medicaid Managed Care    Pharmacy Note  10/08/2020 Name: Terry Parks MRN: [id number] DOB: [birth year]  Terry Parks is a 65 y.o. year old male who is a primary care patient of [name], MD. The [facility] Managed Care Coordination team was consulted for assistance with disease management and care coordination needs.    Engaged with patient Engaged with patient by telephone for initial visit in response to referral for case management and/or care coordination services.  [name] was given information about Managed Medicaid Care Coordination team services today. Terry Parks agreed to services and verbal consent obtained.   Objective:  Lab Results  Component Value Date   CREATININE 1.85 (H) 07/18/2020   CREATININE 1.32 (H) 04/01/2020   CREATININE 2.11 (H) 03/26/2020    Lab Results  Component Value Date   HGBA1C 11.1 (A) 07/18/2020       Component Value Date/Time   CHOL 236 (H) 12/12/2019 1716   TRIG 182 (H) 12/12/2019 1716   HDL 28 (L) 12/12/2019 1716   CHOLHDL 8.4 (H) 12/12/2019 1716   CHOLHDL 3.9 01/09/2018 0509   VLDL 11 01/09/2018 0509   LDLCALC 174 (H) 12/12/2019 1716    Other: (TSH, CBC, Vit D, etc.)  Clinical ASCVD: Yes  The 10-year ASCVD risk score Terry Parks DC Jr., et al., 2013) is: 28.9%   Values used to calculate the score:     Age: 41 years     Sex: Male     Is Non-Hispanic African American: Yes     Diabetic: Yes     Tobacco smoker: No     Systolic Blood Pressure: 562 mmHg     Is BP treated: Yes     HDL Cholesterol: 28 mg/dL     Total Cholesterol: 236 mg/dL    Other: (CHADS2VASc if Afib, PHQ9 if depression, MMRC or CAT for COPD, ACT, DEXA)  BP Readings from Last 3 Encounters:  08/07/20 122/84  07/18/20 115/69  04/23/20 120/80    Assessment/Interventions: Review of patient past medical history, allergies, medications, health status, including review of consultants reports, laboratory and other test data, was performed as part of  comprehensive evaluation and provision of chronic care management services.   Cardio BP: tests daily, all readings below 140/90 -Rivaroxaban 20mg  -Amlodipine 5mg  -Entresto -Carvedilol -Spironolactone 50mg  -Torsemide 20mg  take 2QD Tried/Failed: Losartan Plan: At goal,  patient stable/ symptoms controlled   Lipids: -Atorvastatin 40mg  Plan: At goal,  patient stable/ symptoms controlled   Mental Health 10/08/20: PHQ2: 0 -Duloxetine 60mg  Plan: At goal,  patient stable/ symptoms controlled   Neuropathy Pain Scale: Patient didn't give it a number but stated, "my shoes can come off when I'm sitting down and I don't even notice" -Duloxetine 60mg  Tried/Failed: Gabapentin (given low dose but didn't help and patient stopped) Plan: Wants to hold off on restarting Gaba and try Acupuncture  CKD Lab Results  Component Value Date   CREATININE 1.85 (H) 07/18/2020   BUN 19 07/18/2020   NA 138 07/18/2020   K 3.5 07/18/2020   CL 96 07/18/2020   CO2 22 07/18/2020   Plan: At goal,  patient stable/ symptoms controlled   DM -10/08/20: 123 10/07/20: 124 10/06/20: 127 -Empagliflozin 25mg  -Xultophy 100-3.6: 50 Units Tried/Failed:  Metformin 500mg  ER -Stopped Metformin (Mental fog) Plan: At goal,  patient stable/ symptoms controlled   GERD -Omeprazole 40mg  Plan: At goal,  patient stable/ symptoms controlled  Urination  -Peeing a lot -Has referral for Urolology Plan: Recommended Kegel exercises. Explained how neuropathy  could be contributing  Meds: -Patient states he spends a lot of time organizing meds and doesn't like having to drive to Pharmacy often (He used to lack transportation but has it now) Plan: Will [facility] next week Verbal consent obtained for [facility] enhanced pharmacy services (medication synchronization, adherence packaging, delivery coordination). A medication sync plan was created to allow patient to get all medications delivered once every 30  to 90 days per patient preference. Patient understands they have freedom to choose pharmacy and clinical pharmacist will coordinate care between all prescribers and [facility].    SDOH (Social Determinants of Health) assessments and interventions performed:    Care Plan  Allergies  Allergen Reactions  . Terry Parks Cough    Medications Reviewed Today    Reviewed by Lane Hacker, [facility] (Pharmacist) on 10/08/20 at 1026  Med List Status: <None>  Medication Order Taking? Sig Documenting Provider Last Dose Status Informant  Accu-Chek Softclix Lancets lancets [id number]  Check blood sugar three times a day as instructed [name], Vahini, MD  Active Self  acetaminophen (TYLENOL) 325 MG tablet [id number]  Take 650 mg by mouth every 6 (six) hours as needed for moderate pain.  [provider]  Active Self  amiodarone (PACERONE) 200 MG tablet [id number]  TAKE 1 TABLET (200 MG TOTAL) BY MOUTH DAILY. Bloomfield, Carley D, DO  Active   amLODipine-atorvastatin (CADUET) 5-40 MG tablet [id number]  Take 1 tablet by mouth daily. [provider]  Active         Discontinued 54/27/06 2376 (Duplicate)   carvedilol (COREG) 25 MG tablet [id number]  TAKE 1 TABLET (25 MG TOTAL) BY MOUTH 2 (TWO) TIMES DAILY WITH A MEAL. [name], [name], MD  Active   DULoxetine (CYMBALTA) 60 MG capsule [id number]  Take 60 mg by mouth daily. [provider]  Active   Elastic Bandages & Supports (ABDOMINAL BINDER/ELASTIC XL) MISC [id number]  1 Units by Does not apply route as needed.  Patient not taking: Reported on 08/07/2020   Terry Anis, MD  Active   empagliflozin (JARDIANCE) 25 MG TABS tablet [id number]  TAKE 1 TABLET (25 MG TOTAL) BY MOUTH DAILY BEFORE BREAKFAST. Terry Anis, MD  Active   glucose blood (ACCU-CHEK AVIVA) test strip [id number]  Use to check blood sugar 3 times daily diag code E11.42. insulin dependent Forde Dandy, PharmD  Active Self  Insulin Degludec-Liraglutide (XULTOPHY) 100-3.6  UNIT-MG/ML SOPN [id number] Yes Inject 50 Units into the skin daily. Rehman, Areeg N, DO Taking Active   Insulin Pen Needle 31G X 6 MM MISC [id number] Yes USE AS DIRECTED AT BEDTIME. [name], MD Taking Active   metFORMIN (GLUCOPHAGE-XR) 500 MG 24 hr tablet [id number] No TAKE 4 TABLETS (2,000 MG TOTAL) BY MOUTH DAILY WITH BREAKFAST.  Patient not taking: No sig reported   Terry Anis, MD Not Taking Active   nystatin (NYSTATIN) powder [id number]  Apply 1 application topically 3 (three) times daily. Andrew Au, MD  Active Self           Med Note Lanna Poche R   Tue Apr 01, 2020 11:00 AM)    omeprazole (PRILOSEC) 40 MG capsule [id number] Yes TAKE 1 CAPSULE BY MOUTH TWICE DAILY BEFORE A MEAL. Bloomfield, Truckee D, DO Taking Active   rivaroxaban (XARELTO) 20 MG TABS tablet [id number] Yes Take 1 tablet (20 mg total) by mouth daily with breakfast. [name], MD Taking Active Self  sacubitril-valsartan (ENTRESTO) 97-103 MG [id number] Yes TAKE 1 TABLET  BY MOUTH 2 (TWO) TIMES DAILY. [name], [name], MD Taking Active   spironolactone (ALDACTONE) 50 MG tablet [id number] Yes Take 50 mg by mouth daily. [provider] Taking Active   torsemide (DEMADEX) 20 MG tablet [id number] Yes TAKE 2 TABLETS (40 MG TOTAL) BY MOUTH DAILY. Madalyn Rob, MD Taking Active           Patient Active Problem List   Diagnosis Date Noted  . S/P laparoscopic hernia repair 03/24/2020  . History of iron deficiency anemia 12/16/2019  . Anemia 12/16/2019  . History of esophagitis 12/16/2019  . Ventral hernia without obstruction or gangrene 12/16/2019  . Acute on chronic HFrEF (heart failure with reduced ejection fraction) ([place]) 08/11/2019  . 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  . Depression 01/31/2019  . Right hip pain 09/19/2018  . Iron deficiency anemia 04/28/2018  . Diastasis recti 04/28/2018  . Abdominal hernia 03/10/2018  . Stage 3 chronic kidney disease 02/11/2018  . Healthcare maintenance  02/11/2018  . Physical deconditioning 02/11/2018  . Stress incontinence of urine 01/06/2018  . Pain due to dental caries 11/09/2017  . Prostate cancer ([place]) 10/06/2017  . Bladder mass 07/13/2017  . Persistent atrial fibrillation   . Vitamin D deficiency 06/22/2016  . Neuropathy in diabetes ([place]) 10/07/2015  . Erectile dysfunction 04/17/2014  . Allergic rhinitis 10/19/2012  . Diabetic retinopathy ([place]) 10/04/2012  . BPH   . Type 2 diabetes mellitus with peripheral neuropathy ([place]) 04/08/2008  . Obstructive sleep apnea   . Morbid obesity ([place])   . Hyperlipidemia associated with type 2 diabetes mellitus ([place])   . Hypertension associated with diabetes ([place])   . Chronic combined systolic and diastolic CHF (congestive heart failure) ([place]) 03/01/2006    Conditions to be addressed/monitored: HTN, Hypertriglyceridemia and DM  Care Plan : Medication Management  Updates made by Lane Hacker, [place] since 10/08/2020 12:00 AM    Problem: Health Promotion or Disease Self-Management (General Plan of Care)     Goal: Medication Management   Note:   Current Barriers:  . Unable to achieve control of DM  . Does not adhere to prescribed medication regimen . Does not maintain contact with provider office . Does not contact provider office for questions/concerns .   Pharmacist Clinical Goal(s):  [name] Over the next 30 days, patient will adhere to prescribed medication regimen as evidenced by understanding of mechanism . contact provider office for questions/concerns as evidenced notation of same in electronic health record through collaboration with PharmD and provider.  .   Interventions: . Inter-disciplinary care team collaboration (see longitudinal plan of care) . Comprehensive medication review performed; medication list updated in electronic medical record  @RXCPDIABETES @ @RXCPHYPERTENSION @ @RXCPHYPERLIPIDEMIA @ @RXCPMENTALHEALTH @  Patient Goals/Self-Care Activities . Over the next 30  days, patient will:  - take medications as prescribed collaborate with provider on medication access solutions  Follow Up Plan: The care management team will reach out to the patient again over the next 30 days.     Task: Mutually Develop and Royce Macadamia Achievement of Patient Goals   Note:   Care Management Activities:    - verbalization of feelings encouraged    Notes:      Medication Assistance: Has difficulty driving and organizing his meds, would like help with this  Follow up: Agree  Plan: The care management team will reach out to the patient again over the next 30 days.   [place] Constable, Pharm.D., Managed Medicaid Pharmacist - [phone]

## 2020-10-15 ENCOUNTER — Other Ambulatory Visit: Payer: Self-pay

## 2020-10-15 ENCOUNTER — Other Ambulatory Visit: Payer: Medicaid Other | Admitting: Obstetrics and Gynecology

## 2020-10-15 NOTE — Patient Outreach (Signed)
Care Coordination  10/15/2020  Terry Parks 1955/09/10 [id number]   RNCM returned patient's phone call.  RNCM provided patient with [facility] [employer] phone number at his request-patient with new phone and unable to locate number.  Patient has appointment tomorrow and needs to schedule transportation.  [name] RN, BSN [place]  [name] - Managed Medicaid High Risk [phone].

## 2020-10-16 ENCOUNTER — Other Ambulatory Visit: Payer: Self-pay

## 2020-10-16 ENCOUNTER — Telehealth: Payer: Self-pay | Admitting: Cardiovascular Disease

## 2020-10-16 ENCOUNTER — Other Ambulatory Visit (HOSPITAL_COMMUNITY): Payer: Self-pay

## 2020-10-16 DIAGNOSIS — I4819 Other persistent atrial fibrillation: Secondary | ICD-10-CM

## 2020-10-16 DIAGNOSIS — I5042 Chronic combined systolic (congestive) and diastolic (congestive) heart failure: Secondary | ICD-10-CM

## 2020-10-16 MED ORDER — AMIODARONE HCL 200 MG PO TABS: 200.0000 mg | ORAL_TABLET | Freq: Every day | ORAL | 6 refills | Status: DC

## 2020-10-16 MED ORDER — AMLODIPINE-ATORVASTATIN 5-40 MG PO TABS: 1.0000 | ORAL_TABLET | Freq: Every day | ORAL | 6 refills | Status: DC

## 2020-10-16 MED ORDER — CARVEDILOL 25 MG PO TABS: 25.0000 mg | ORAL_TABLET | Freq: Two times a day (BID) | ORAL | 6 refills | Status: DC

## 2020-10-16 MED ORDER — SACUBITRIL-VALSARTAN 97-103 MG PO TABS: 1.0000 | ORAL_TABLET | Freq: Two times a day (BID) | ORAL | 6 refills | Status: DC

## 2020-10-16 MED ORDER — SPIRONOLACTONE 50 MG PO TABS: 50.0000 mg | ORAL_TABLET | Freq: Every day | ORAL | 6 refills | Status: DC

## 2020-10-16 MED ORDER — TORSEMIDE 20 MG PO TABS: ORAL_TABLET | Freq: Every day | ORAL | 5 refills | Status: DC

## 2020-10-16 NOTE — Telephone Encounter (Signed)
Calling to see how is the patient getting rivaroxaban (XARELTO) 20 MG TABS tablet wanting to know if the patient is getting samples or not. Please advise

## 2020-10-16 NOTE — Progress Notes (Deleted)
CC: ***  HPI:  [name] is a 65 y.o. man with history as below who presents to clinic for ***. His last clinic visit was on ***.   To see the details of this patient's management of their acute and chronic problems, please refer to the Assessment & Plan under the Encounters tab.    Past Medical History:  Diagnosis Date  . Abscessed tooth    top back large cavity no pain or drainage, one on bottom  pt pulled tooth 4-5 months ago, right top large hole in tooth  . AKI (acute kidney injury) ([place])   . Allergic rhinitis   . Anemia   . Anxiety   . Asthma   . Atrial fibrillation ([place])   . BPH (benign prostatic hypertrophy)    Massive BPH noted on cystoscopy 1/23/ 2012 by Dr. Risa Grill.  . Cancer [facility])    prostate cancer 2019  . Cardiomyopathy ([place])   . CHF (congestive heart failure) ([place])   . Cough 03/30/2012  . Depression   . Diabetes mellitus 04/08/2008   type 2  . Dyspnea   . Dysrhythmia 2019  . Foley catheter in place 07-05-17 placed  . Fracture, orbital ([place]) 2021   Right  . GERD (gastroesophageal reflux disease) 2020  . Headache(784.0)    hx migraines none recent  . Hyperlipemia   . Hypertension   . Hypertensive cardiopathy 03/01/2006   2-D echocardiogram 02/01/2012 showed moderate LVH, mildly to moderately reduced left ventricular systolic function with an estimated ejection fraction of 40-45%, and diffuse hypokinesis.  A nuclear medicine stress study done 01/31/2012 showed no reversible ischemia, a small mid anterior wall fixed defect/infarct, and ejection fraction 42%.      . Neck pain   . Nephrolithiasis 05/29/2010   CT scan of abdomen/pelvis on 05/29/2010 showed an obstructing approximate 1-2 mm calculus at the left UVJ, and an approximate 1-2 mm left lower pole renal calculus.   Patient had continuing severe pain , and an elevation of his serum creatinine to a value of 1.75 on 06/06/2010.  Patient underwent cystoscopy on 06/08/2010 by Dr. Risa Grill, but attempts at  retrograde pyelogram and ureteroscopy were unsucc  . Numbness 01/08/2018  . Obstructive sleep apnea 03/06/2008   Sleep study 03/06/08 showed severe OSA/hypopnea syndrome, with successful CPAP titration to 13 CWP using a medium ResMed Mirage Quattro full face mask with heated humidifier.   . Rash 04/17/2014  . Renal calculus 05/29/2010   CT scan of abdomen/pelvis on 05/29/2010 showed an obstructing approximate 1-2 mm calculus at the left UVJ, and an approximate 1-2 mm left lower pole renal calculus.   Patient had continuing severe pain , and an elevation of his serum creatinine to a value of 1.75 on 06/06/2010.  The stone had apparently passed and was not seen on repeat CT 06/08/2010.  . Sleep apnea    haven't use cpap in 2 years  . Tooth pain 10/29/2017  . Urinary straining 11/02/2016   Review of Systems:    ROS  Physical Exam:  There were no vitals filed for this visit. Constitutional: well-appearing man sitting in chair, in no acute distress HENT: normocephalic atraumatic, mucous membranes moist Eyes: conjunctiva non-erythematous Neck: supple Cardiovascular: regular rate and rhythm, no m/r/g Pulmonary/Chest: normal work of breathing on room air, lungs clear to auscultation bilaterally Abdominal: soft, non-tender, non-distended MSK: normal bulk and tone Neurological: alert & oriented x 3, 5/5 strength in bilateral upper and lower extremities, normal gait Skin: warm and  dry*** Psych: ***    Assessment & Plan:   See Encounters Tab for problem-based charting.  Patient {GC/GE:3044014::"discussed with","seen with"} Dr. {NAMES:[id number]::"Williams","Guilloud","Hoffman","Mullen","Narendra","Raines","Vincent"}

## 2020-10-16 NOTE — Telephone Encounter (Signed)
Spoke with Ovid Curd, he is concerned because he has spoken with the patients pharmacy and he has not gotten a refill this year and he is wondering if we are supplying his medications. Aware there are no mention in the chart of giving samples or if he is enrolled in any patient assistance. He is going to start packing his medications and [facility]. Aware refills will be [facility] to [facility] and then will check with dr croitoru's nurse to see if she has any information about assistance she maybe getting the patient for his xarelto. He ask for a message be sent to [facility] on her return to the office.

## 2020-10-16 NOTE — Patient Outreach (Signed)
Medication coordination to onboard patient to [facility]. After clarification form Alisha at Pharmacy, patient is very non-compliant. Per Cardio note in March, they state patient is compliant. Called and spoke with Neoma Laming at [facility] to inform her of this, she told me she will investigate and get the correct medications sent in to [facility] (And check if non-compliance due to samples)  Also, asked PCP for scripts of non-specialist refills  Medication Name PCP Transfer (put Dr.'s name) -Timing Last Fill Date & Day Supply Format: MM/DD/YY - DS (If last fill/DS unavailable, list pt.'s quantity on hand)      BB  B  L  EM  BT   Accu-Chek Softclix Lancets and AVIVA TS And Pen Needles 31G-6MM          Amiodarone 200mg     1    06/04/20 for 30 days  Amlodipine/Atorvastatin 5/40      1  Never filled  Carvedilol 25mg  BID    1  1  05/29/20 for 90 days  Duloxetine 60mg     1    Never Filled  Empagliflozin 25mg     1    08/05/20 for 90 days  Xultophy 100-3.6 - 50 Units        08/14/20 for 30 days            Omeprazole 40mg     VIALS    Never filled  Rivaroxaban 20mg     1    11/15/19  Entresto 97-103    1  1  08/05/20 for 30 days  Spironolactone 50mg  (Maybe stop on discharge)    1    12/16/19 for 30 days  Torsemide 20mg  (If we can turn this to 40mg  QD that'd be great)    2     09/15/20 for 30 days

## 2020-10-17 ENCOUNTER — Encounter: Payer: Medicaid Other | Admitting: Student

## 2020-10-17 MED ORDER — RIVAROXABAN 20 MG PO TABS: 20.0000 mg | ORAL_TABLET | Freq: Every day | ORAL | 3 refills | Status: DC

## 2020-10-17 NOTE — Telephone Encounter (Signed)
Yes

## 2020-10-17 NOTE — Telephone Encounter (Signed)
Returned the call to [place]. He stated that the patient has not been complaint with his medications. He will packing the medications for the patient and has asked that refills be sent to [facility].   The last time Xarelto was filled was 11/2019.

## 2020-10-17 NOTE — Patient Outreach (Signed)
Spoke with Terry Parks at [facility] [employer], let her know of non-compliance.  Will send new scripts to [facility]

## 2020-10-20 ENCOUNTER — Other Ambulatory Visit (HOSPITAL_COMMUNITY): Payer: Self-pay

## 2020-10-20 ENCOUNTER — Other Ambulatory Visit: Payer: Self-pay | Admitting: Student

## 2020-10-20 DIAGNOSIS — Z8719 Personal history of other diseases of the digestive system: Secondary | ICD-10-CM

## 2020-10-20 DIAGNOSIS — E1142 Type 2 diabetes mellitus with diabetic polyneuropathy: Secondary | ICD-10-CM

## 2020-10-20 MED ORDER — ATORVASTATIN CALCIUM 40 MG PO TABS: 40.0000 mg | ORAL_TABLET | Freq: Every day | ORAL | 3 refills | Status: DC

## 2020-10-20 MED ORDER — DULOXETINE HCL 60 MG PO CPEP: 60.0000 mg | ORAL_CAPSULE | Freq: Every day | ORAL | 3 refills | Status: DC

## 2020-10-20 MED ORDER — INSULIN PEN NEEDLE 31G X 6 MM MISC
3 refills | Status: DC
Start: 2020-10-20 — End: 2020-10-24

## 2020-10-20 MED ORDER — EMPAGLIFLOZIN 25 MG PO TABS: ORAL_TABLET | ORAL | 3 refills | Status: DC

## 2020-10-20 MED ORDER — METFORMIN HCL ER 500 MG PO TB24: ORAL_TABLET | ORAL | 3 refills | Status: DC

## 2020-10-20 MED ORDER — OMEPRAZOLE 40 MG PO CPDR: 40.0000 mg | DELAYED_RELEASE_CAPSULE | Freq: Two times a day (BID) | ORAL | 3 refills | Status: DC

## 2020-10-20 MED ORDER — ACCU-CHEK SOFTCLIX LANCETS MISC
3 refills | Status: DC
Start: 2020-10-20 — End: 2020-10-29

## 2020-10-20 MED ORDER — XULTOPHY 100-3.6 UNIT-MG/ML ~~LOC~~ SOPN
50.0000 [IU] | PEN_INJECTOR | Freq: Every day | SUBCUTANEOUS | 0 refills | Status: DC
Start: 2020-10-20 — End: 2020-10-24

## 2020-10-20 NOTE — Patient Outreach (Signed)
Sent PCP direct ASAP msg asking for refills to [facility]  Accu-Chek Softclix Lancets and AVIVA TS And Pen Needles 31G-6MM Atorvastatin Duloxetine Empagliflozin Xultophy Omeprazole   Will deliver 10/22/20

## 2020-10-20 NOTE — Progress Notes (Signed)
Contacted by [facility] pharmacist Arizona Constable due to concern for medication non adherence. Plan to have medications packaged and delivered in addition to monthly calls to help with this. Will place mediation orders to [facility].

## 2020-10-21 ENCOUNTER — Other Ambulatory Visit (HOSPITAL_COMMUNITY): Payer: Self-pay

## 2020-10-24 ENCOUNTER — Other Ambulatory Visit: Payer: Self-pay | Admitting: Student

## 2020-10-24 ENCOUNTER — Encounter (HOSPITAL_COMMUNITY): Payer: Self-pay | Admitting: Student

## 2020-10-24 ENCOUNTER — Telehealth: Payer: Self-pay | Admitting: Cardiovascular Disease

## 2020-10-24 DIAGNOSIS — E1142 Type 2 diabetes mellitus with diabetic polyneuropathy: Secondary | ICD-10-CM

## 2020-10-24 MED ORDER — DULOXETINE HCL 60 MG PO CPEP: 60.0000 mg | ORAL_CAPSULE | Freq: Every day | ORAL | 3 refills | Status: DC

## 2020-10-24 MED ORDER — INSULIN PEN NEEDLE 31G X 6 MM MISC: 3 refills | Status: DC

## 2020-10-24 MED ORDER — XULTOPHY 100-3.6 UNIT-MG/ML ~~LOC~~ SOPN
50.0000 [IU] | PEN_INJECTOR | Freq: Every day | SUBCUTANEOUS | 0 refills | Status: DC
Start: 2020-10-24 — End: 2020-10-29

## 2020-10-24 MED ORDER — GLUCOSE BLOOD VI STRP: ORAL_STRIP | 11 refills | Status: DC

## 2020-10-24 NOTE — Telephone Encounter (Signed)
*  STAT* If patient is at the pharmacy, call can be transferred to refill team.   1. Which medications need to be refilled? (please list name of each medication and dose if known)  empagliflozin (JARDIANCE) 25 MG TABS tablet [[id number]]  2. Which pharmacy/location (including street and city if local pharmacy) is medication to be sent to?  [facility] - [place], [place] - [place] [address]  3. Do they need a 30 day or 90 day supply? 30 day supply

## 2020-10-24 NOTE — Telephone Encounter (Signed)
Rec'd phone call from [facility] with [facility] requesting the following medication to be refilled:   DULoxetine (CYMBALTA) 60 MG capsule  glucose blood (ACCU-CHEK AVIVA) test strip  Insulin Degludec-Liraglutide (XULTOPHY) 100-3.6 UNIT-MG/ML SOPN  Insulin Pen Needle 31G X 6 MM MISC  [facility] - [place], [place] - [address] (Ph: [phone]

## 2020-10-25 ENCOUNTER — Other Ambulatory Visit: Payer: Self-pay | Admitting: Student

## 2020-10-29 ENCOUNTER — Other Ambulatory Visit: Payer: Self-pay | Admitting: Student

## 2020-10-29 ENCOUNTER — Other Ambulatory Visit (HOSPITAL_COMMUNITY): Payer: Self-pay

## 2020-10-29 DIAGNOSIS — E1142 Type 2 diabetes mellitus with diabetic polyneuropathy: Secondary | ICD-10-CM

## 2020-10-29 DIAGNOSIS — Z8719 Personal history of other diseases of the digestive system: Secondary | ICD-10-CM

## 2020-10-29 MED ORDER — DULOXETINE HCL 60 MG PO CPEP: 60.0000 mg | ORAL_CAPSULE | Freq: Every day | ORAL | 3 refills | Status: DC

## 2020-10-29 MED ORDER — ATORVASTATIN CALCIUM 40 MG PO TABS: 40.0000 mg | ORAL_TABLET | Freq: Every day | ORAL | 3 refills | Status: DC

## 2020-10-29 MED ORDER — EMPAGLIFLOZIN 25 MG PO TABS: ORAL_TABLET | ORAL | 3 refills | Status: DC

## 2020-10-29 MED ORDER — XULTOPHY 100-3.6 UNIT-MG/ML ~~LOC~~ SOPN
50.0000 [IU] | PEN_INJECTOR | Freq: Every day | SUBCUTANEOUS | 0 refills | Status: DC
Start: 2020-10-29 — End: 2020-11-10

## 2020-10-29 MED ORDER — OMEPRAZOLE 40 MG PO CPDR: 40.0000 mg | DELAYED_RELEASE_CAPSULE | Freq: Two times a day (BID) | ORAL | 3 refills | Status: DC

## 2020-10-29 MED ORDER — ACCU-CHEK SOFTCLIX LANCETS MISC: 3 refills | Status: DC

## 2020-10-29 MED ORDER — METFORMIN HCL ER 500 MG PO TB24
ORAL_TABLET | ORAL | 3 refills | Status: DC
Start: 2020-10-29 — End: 2020-11-10

## 2020-10-29 NOTE — Patient Outreach (Signed)
Due to delays in getting meds, patient will get all meds delivered on 10/31/20, spoke with patient and he's agreeable to this. F/U 1 month

## 2020-11-06 ENCOUNTER — Other Ambulatory Visit: Payer: Self-pay

## 2020-11-06 ENCOUNTER — Other Ambulatory Visit: Payer: Self-pay | Admitting: Obstetrics and Gynecology

## 2020-11-06 NOTE — Patient Instructions (Signed)
Hi [name], thank you for speaking with me today.  [name] was given information about Medicaid Managed Care team care coordination services as a part of their Healthy [facility] Medicaid benefit. Terry Parks verbally consented to engagement with the [facility] Managed Care team.   For questions related to your Healthy [facility] health plan, please call: [phone] or visit the homepage here: [email or web address]  If you would like to schedule transportation through your Healthy [facility] plan, please call the following number at least 2 days in advance of your appointment: [phone]   Call the [facility] at [phone], at any time, 24 hours a day, 7 days a week. If you are in danger or need immediate medical attention call 911.  [name] - following are the goals we discussed in your visit today:   Goals Addressed             This Visit's Progress    Monitor and Manage My Blood Sugar-Diabetes Type 2       Timeframe:  Long-Range Goal Priority:  High Start Date:        06/03/20                     Expected End Date:   ongoing               Follow Up Date:  12/06/20   - check blood sugar at prescribed times - check blood sugar if I feel it is too high or too low - enter blood sugar readings and medication or insulin into my phone log - take the blood sugar log to all doctor visits - take the blood sugar meter to all doctor visits    Why is this important?   Checking your blood sugar at home helps to keep it from getting very high or very low.  Writing the results in a diary or log helps the doctor know how to care for you.  Your blood sugar log should have the time, date and the results.  Also, write down the amount of insulin or other medicine that you take.  Other information, like what you ate, exercise done and how you were feeling, will also be helpful.     Notes:- most fasting CBGs meeting target  but Hgb A1C has increased so discussed CGM with patient Update 11/06/20:  Patient's blood sugars 100's per patient, has appointment with PCP 11/10/20.      Protect My Health       Timeframe:  Long-Range Goal Priority:  Medium Start Date:      10/07/20                       Expected End Date:  ongoing                Follow Up Date:  12/06/20   - schedule appointment for flu shot - schedule appointment for vaccines needed due to my age or health - schedule recommended health tests. - schedule and keep appointment for annual check-up  Update 11/06/20:  Patient has appointment with PCP 11/10/20.   Why is this important?   Screening tests can find diseases early when they are easier to treat.  Your doctor or nurse will talk with you about which tests are important for you.  Getting shots for common diseases like the flu and shingles will help prevent them.       Track and Manage Fluids  and Swelling-Heart Failure       Timeframe:  Long-Range Goal Priority:  High Start Date:          01/14/20                   Expected End Date:   ongoing     Follow Up Date:  12/06/20   - call office if I gain more than 2 pounds in one day or 5 pounds in one week - do ankle pumps when sitting - use salt in moderation - watch for swelling in feet, ankles and legs every day - weigh myself daily  - log weight in my phone   Why is this important?   It is important to check your weight daily and watch how much salt and liquids you have.  It will help you to manage your heart failure.    Notes: patient states he is weighing everyday, discussed dry weight goal of <310 lbs Update 11/06/20:  Patient states he has appointment with PCP 11/10/20.     Track and Manage My Blood Pressure-Hypertension       Timeframe:  Long-Range Goal Priority:  High Start Date:     06/03/20                        Expected End Date:  ongoing                 Follow Up Date:  12/06/20 - check blood pressure daily - write blood  pressure results in my phone log    Why is this important?   You won't feel high blood pressure, but it can still hurt your blood vessels.  High blood pressure can cause heart or kidney problems. It can also cause a stroke.  Making lifestyle changes like losing a little weight or eating less salt will help.  Checking your blood pressure at home and at different times of the day can help to control blood pressure.  If the doctor prescribes medicine remember to take it the way the doctor ordered.  Call the office if you cannot afford the medicine or if there are questions about it.     Notes: patient checks BP everyday and records in his smart phone; meeting treatment targets Update 11/06/20:  Patient states he is checking blood pressure daily, 130-140/90.  Has appointment with PCP 11/10/20.         Patient verbalizes understanding of instructions provided today.   The Managed Medicaid care management team will reach out to the patient again over the next 30 days.  The patient has been provided with contact information for the Managed Medicaid care management team and has been advised to call with any health related questions or concerns.   Terry Raider RN, BSN [place]  [name] - Managed Medicaid High Risk [phone].   Following is a copy of your plan of care:    Patient Care Plan: CCM RN- Heart Failure (Adult), HTN, PAF. CKD     Problem Identified: Symptom Exacerbation (Heart Failure), PAF, HTN   Priority: High  Onset Date: 06/03/2020     Long-Range Goal: Chronic disease states of HTN, HF, and PAF Symptoms and/or Exacerbations Prevented or Minimized   Start Date: 01/14/2020  Expected End Date: 01/13/2021  Recent Progress: On track  Priority: High  Note:   CARE PLAN ENTRY (see longitudinal plan of care for additional care plan information)  Current Barriers:  Knowledge deficit related to basic heart failure, HTN, PAF  pathophysiology and self care management- spoke with patient to complete follow up assessment, He continues to record blood sugar, blood pressure and weights in a log in his phone, he says most of blood sugars are <150, reports today's weight as 305 lbs,  he says he does not elevate his legs even though he knows he should, he reports good medication taking behavior, patient is able to correctly identify the weight gain parameters that would require MD notification Nurse Case Manager Clinical Goal(s):  Over the next 30-90 days, patient will weigh self daily and record Over the next 30-90 days patient will check blood pressure daily and record Over the next 30-90  days, patient will verbalize understanding of Heart Failure Action Plan and when to call doctor Over the next 30-90 days, patient will take all mediations as prescribed Over the next 30-90 days, patient will notify provider for problems related to HTN, HF and PAF Over the next 30-90 days patient will attend all scheduled provider appointments  Update 11/06/20:  patient states he is checking his blood pressure daily.  Interventions:  Basic overview and discussion of pathophysiology of Heart Failure, HTN, and PAF Reiterated importance of good medication behavior and ensured patient is now taking all prescribed medications Reviewed Heart Failure Action Plan  Reviewed importance of daily weights and reminded patient of target dry weight of <310 per Dr. Sallyanne Kuster Reviewed upcoming appointments: 6/27 clinic appointment,  Patient Self Care Activities:  Take all medications as prescribed Weigh daily and record (notify MD with 3 lb weight gain over night or 5 lb in a week) Follow CHF Action Plan - call office if I gain more than 2 pounds in one day or 5 pounds in one week - do ankle pumps when sitting - use salt in moderation - watch for swelling in feet, ankles and legs every day - weigh myself daily compare my dry weight of <310 to current  weight and call MD if more than 2 lbs in one day of 5 lbs in one week or weight >310 check blood pressure daily - write blood pressure results in my phone log      Patient Care Plan: CCM RN- Diabetes Type 2 (Adult)     Problem Identified: Glycemic Management (Diabetes, Type 2)   Priority: High  Onset Date: 06/03/2020     Long-Range Goal: Glycemic Management Optimized   Start Date: 07/20/2019  Expected End Date: 02/06/2021  Recent Progress: Not on track  Priority: High  Note:    CARE PLAN ENTRY (see longitudinal plan of care for additional care plan information)  Objective:   Lab Results  Component Value Date   HGBA1C 11.1 (A) 07/18/2020   HGBA1C 9.0 (H) 03/19/2020   HGBA1C 10.1 (A) 12/12/2019   Lab Results  Component Value Date   MICROALBUR 11.3 (H) 04/17/2014   [place] 174 (H) 12/12/2019   CREATININE 1.85 (H) 07/18/2020     Current Barriers:  Knowledge Deficits related to basic Diabetes pathophysiology and self care/management Knowledge Deficits related to medications used for management of diabetes- spoke with patient to complete follow up assessment, he reports fasting blood sugars are usually less than 150 with 2 recent readings around 90, says he does not understand why his Hgb A1C was higher,   he reports good medication taking behavior, denies hypoglycemia, says he wants to try the [facility] and will discuss with his provider during June clinic  appointment  Update 11/06/20:  patient states blood sugars 100s-200s, has appt with PCP 11/10/20.  Case Manager Clinical Goal(s):  Over the next 30-60  days, patient will demonstrate improved adherence to prescribed treatment plan for diabetes self care/management as evidenced by:  daily monitoring and recording of CBG  adherence to ADA/ carb modified diet adherence to prescribed medication regimen  Interventions:  Reviewed education with patient about basic DM disease process Reviewed medications and assessed  medication taking behavior Ensured patient has ample supply of all prescribed medications Discussed Hgb A1C of 11.1% and the correlation to average blood sugar Again discussed  trial and benefits of CGM, advised patient his Healthy [employer] covers both the [employer]  and the Dexcom CGM Advised patient, providing education and rationale, to check CBG at least daily and record, calling provider and/or CCM RN for findings outside established parameters. Educated patient that leg and foot pain will likely decrease if blood sugar control is improved and a CGM could provide that assistance  Discussed plans with patient for ongoing care management follow up and provided patient with direct contact information for care management team Review of patient status, including review of consultants reports, relevant laboratory and other test results, and medications completed.   Patient Self Care Activities:  UNABLE to independently self manage DM Self administers oral medications as prescribed Self administers insulin as prescribed Self administers injectable DM medication Xultophy as prescribed Attends all scheduled provider appointments Checks blood sugars as prescribed and utilize hyper and hypoglycemia protocol as needed Adheres to prescribed ADA/carb modified check blood sugar at prescribed times - check blood sugar if I feel it is too high or too low - enter blood sugar readings and medication or insulin into my phone log - take the blood sugar log to all doctor visits - take the blood sugar meter to all doctor visits     Patient Care Plan: CCM RN- Chronic Pain (Adult)- lower extremeties     Problem Identified: Chronic Pain Management (Chronic Pain)      Long-Range Goal: Chronic Pain Managed   Start Date: 07/15/2020  Expected End Date: 07/15/2021  Recent Progress: On track  Priority: High  Note:   Current Barriers:  Knowledge Deficits related to self-health management of  chronic lower extremity pain Chronic Disease Management support and education needs related to chronic pain Care Coordination needs related to allowing massage therapy in a patient with chronic lower extremity pain Unable to independently manage chronic LE pain- Unable to independently manage chronic LE pain- patient states chronic leg and foot pain continue Clinical Goal(s):  patient will verbalize understanding of plan for pain management.  and patient will use pharmacological and nonpharmacological pain relief strategies as prescribed.  Interventions:  Collaboration with [name], MD regarding development and update of comprehensive plan of care as evidenced by provider attestation and co-signature Pain assessment performed Medications reviewed Explained to patient that his chronic care will be transitioned to a managed Medicaid team, a team comprised of people who are trained on all the extra benefits and resources available to him through his managed Medicaid  plan. Discussed plans with patient for ongoing care management follow up and provided patient with direct contact information for care management team Discussed improvement  in DM control will likely result in less pain in legs and feet  Patient Goals/Self Care Activities:  Will self-administer medications as prescribed Will attend all scheduled provider appointments Will call pharmacy for medication refills 7 days prior  to needed refill date Patient will calls provider office for new concerns or questions Follow Up Plan: The care management team will reach out to the patient again over the next 30-60 days.       Patient Care Plan: General Plan of Care (Adult)     Problem Identified: Health Promotion or Disease Self-Management (General Plan of Care)   Priority: Medium  Onset Date: 10/07/2020     Long-Range Goal: Self-Management Plan Developed   Start Date: 10/07/2020  Expected End Date: 01/07/2021  This Visit's  Progress: Not on track  Priority: Medium  Note:   Current Barriers:  Ineffective Self Health Maintenance Patient with complaints of leg and foot pain related to neuropathy-states he stopped taking Gabapentin because it was not working-patient would like a referral to a neurologist.  Patient also complaining of incontinence and would like referral to a different urologist. Currently UNABLE TO independently self manage needs related to chronic health conditions.  Knowledge Deficits related to short term plan for care coordination needs and long term plans for chronic disease management needs Nurse Case Manager Clinical Goal(s):  patient will work with care management team to address care coordination and chronic disease management needs related to Disease Management Educational Needs Care Coordination Medication Management and Education Medication Reconciliation Medication Assistance  Psychosocial Support   Interventions:  Evaluation of current treatment plan and patient's adherence to plan as established by provider. Reviewed medications with patient. Collaborated with pharmacy regarding referrals. Collaborated with PCP for referrals. Update 11/06/20:  patient states he has not heard from PCP office and he will follow up with them at appt 11/10/20. Discussed plans with patient for ongoing care management follow up and provided patient with direct contact information for care management team Advised patient, providing education and rationale, to monitor blood pressure daily and record, calling provider for findings outside established parameters.  Reviewed scheduled/upcoming provider appointments. Advised patient, providing education and rationale, to check cbg and record, calling provider for findings outside established parameters.   Pharmacy referral for medication review. Self Care Activities:  Patient will self administer medications as prescribed Patient will attend all scheduled  provider appointments Patient will call pharmacy for medication refills Patient will continue to perform ADL's independently Patient will call provider office for new concerns or questions Patient Goals: In the next 30 days, patient will meet with Pharmacist for medication review. In the next 30 days, patient will receive referrals from PCP for neurologist and urologist. - Follow Up Plan: The patient has been provided with contact information for the care management team and has been advised to call with any health related questions or concerns.  The care management team will reach out to the patient again over the next 30 days.    Evidence-based guidance:   Review biopsychosocial determinants of health screens.  Review need for preventive screening based on age, sex, family history and health history.  Determine level of modifiable health risk.  Discuss identified risks.  Identify areas where behavior change may lead to improved health.  Promote healthy lifestyle.  Evoke change talk using open-ended questions, pros and cons, as well as looking forward.  Identify and manage conditions or preconditions to reduce health risk.  Implement additional goals and interventions based on identified risk factors.

## 2020-11-06 NOTE — Patient Outreach (Signed)
Medicaid Managed Care   Nurse Care Manager Note  11/06/2020 Name:  Terry Parks MRN:  [id number] DOB:  Feb 18, 1956  Terry Parks is an 65 y.o. year old male who is a primary patient of [name], MD.  The [facility] Managed Care Coordination team was consulted for assistance with:    Chronic healthcare management needs.  Mr. Monnin was given information about Medicaid Managed Care Coordination team services today. Terry Parks agreed to services and verbal consent obtained.  Engaged with patient by telephone for follow up visit in response to provider referral for case management and/or care coordination services.   Assessments/Interventions:  Review of past medical history, allergies, medications, health status, including review of consultants reports, laboratory and other test data, was performed as part of comprehensive evaluation and provision of chronic care management services.  SDOH (Social Determinants of Health) assessments and interventions performed:   Care Plan  Allergies  Allergen Reactions   Lisinopril Cough    Medications Reviewed Today     Reviewed by [name], RN (Registered Nurse) on 11/06/20 at 1105  Med List Status: <None>   Medication Order Taking? Sig Documenting Provider Last Dose Status Informant  Accu-Chek Softclix Lancets lancets [id number]  Check blood sugar three times a day as instructed [name], MD  Active   acetaminophen (TYLENOL) 325 MG tablet [id number] No Take 650 mg by mouth every 6 (six) hours as needed for moderate pain.   Patient not taking: Reported on 10/16/2020   [provider] Not Taking Active Self  amiodarone (PACERONE) 200 MG tablet [id number]  Take 1 tablet (200 mg total) by mouth daily. [name], [name], MD  Active   atorvastatin (LIPITOR) 40 MG tablet [id number]  Take 1 tablet (40 mg total) by mouth daily. [name], MD  Active   carvedilol (COREG) 25 MG tablet [id number]  Take 1 tablet (25 mg total)  by mouth 2 (two) times daily with a meal. [name], [name], MD  Active   DULoxetine (CYMBALTA) 60 MG capsule [id number]  Take 1 capsule (60 mg total) by mouth daily. [name], MD  Active   Elastic Bandages & Supports (ABDOMINAL BINDER/ELASTIC XL) MISC [id number] No 1 Units by Does not apply route as needed.  Patient not taking: No sig reported   Mosetta Anis, MD Not Taking Active   empagliflozin (JARDIANCE) 25 MG TABS tablet [id number]  TAKE 1 TABLET (25 MG TOTAL) BY MOUTH DAILY BEFORE BREAKFAST. [name], MD  Active   glucose blood (ACCU-CHEK AVIVA) test strip [id number]  Use to check blood sugar 3 times daily diag code E11.42. insulin dependent [name], MD  Active   Insulin Degludec-Liraglutide (XULTOPHY) 100-3.6 UNIT-MG/ML SOPN [id number]  Inject 50 Units into the skin daily. [name], MD  Active   Insulin Pen Needle 31G X 6 MM MISC [id number]  USE AS DIRECTED AT BEDTIME. [name], MD  Active   metFORMIN (GLUCOPHAGE-XR) 500 MG 24 hr tablet [id number]  TAKE 4 TABLETS (2,000 MG TOTAL) BY MOUTH DAILY WITH BREAKFAST. [name], MD  Active   nystatin (NYSTATIN) powder [id number] No Apply 1 application topically 3 (three) times daily.  Patient not taking: No sig reported   Andrew Au, MD Not Taking Active            Med Note Lanna Poche R   Tue Apr 01, 2020 11:00 AM)    omeprazole (PRILOSEC) 40 MG capsule [id number]  Take 1 capsule (40 mg total) by mouth 2 (two) times  daily with a meal. [name], MD  Active   rivaroxaban (XARELTO) 20 MG TABS tablet [id number]  Take 1 tablet (20 mg total) by mouth daily with breakfast. [name], [name], MD  Active   sacubitril-valsartan (ENTRESTO) 97-103 MG [id number]  Take 1 tablet by mouth 2 (two) times daily. [name], [name], MD  Active   spironolactone (ALDACTONE) 50 MG tablet [id number]  Take 1 tablet (50 mg total) by mouth daily. [name], [name], MD  Active   torsemide (DEMADEX) 20 MG tablet [id number]  TAKE 2 TABLETS (40 MG  TOTAL) BY MOUTH DAILY. [name], [name], MD  Active             Patient Active Problem List   Diagnosis Date Noted   S/P laparoscopic hernia repair 03/24/2020   History of iron deficiency anemia 12/16/2019   Anemia 12/16/2019   History of esophagitis 12/16/2019   Ventral hernia without obstruction or gangrene 12/16/2019   Acute on chronic HFrEF (heart failure with reduced ejection fraction) ([place]) 08/11/2019   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   Depression 01/31/2019   Right hip pain 09/19/2018   Iron deficiency anemia 04/28/2018   Diastasis recti 04/28/2018   Abdominal hernia 03/10/2018   Stage 3 chronic kidney disease 02/11/2018   Healthcare maintenance 02/11/2018   Physical deconditioning 02/11/2018   Stress incontinence of urine 01/06/2018   Pain due to dental caries 11/09/2017   Prostate cancer ([place]) 10/06/2017   Bladder mass 07/13/2017   Persistent atrial fibrillation    Vitamin D deficiency 06/22/2016   Neuropathy in diabetes ([place]) 10/07/2015   Erectile dysfunction 04/17/2014   Allergic rhinitis 10/19/2012   Diabetic retinopathy ([place]) 10/04/2012   BPH    Type 2 diabetes mellitus with peripheral neuropathy ([place]) 04/08/2008   Obstructive sleep apnea    Morbid obesity ([place])    Hyperlipidemia associated with type 2 diabetes mellitus ([place])    Hypertension associated with diabetes ([place])    Chronic combined systolic and diastolic CHF (congestive heart failure) ([place]) 03/01/2006    Conditions to be addressed/monitored per PCP order:   chronic healthcare management needs, IDDM, HF, HTN, PAF, HLD, CKD, OSA, chronic pain.  Care Plan : CCM RN- Heart Failure (Adult), HTN, PAF. CKD  Updates made by [name], RN since 11/06/2020 12:00 AM     Problem: Symptom Exacerbation (Heart Failure), PAF, HTN   Priority: High  Onset Date: 06/03/2020     Long-Range Goal: Chronic disease states of HTN, HF, and PAF Symptoms and/or Exacerbations Prevented or Minimized   Start Date:  01/14/2020  Expected End Date: 01/13/2021  Recent Progress: On track  Priority: High  Note:   CARE PLAN ENTRY (see longitudinal plan of care for additional care plan information)  Current Barriers:  Knowledge deficit related to basic heart failure, HTN, PAF pathophysiology and self care management- spoke with patient to complete follow up assessment, He continues to record blood sugar, blood pressure and weights in a log in his phone, he says most of blood sugars are <150, reports today's weight as 305 lbs,  he says he does not elevate his legs even though he knows he should, he reports good medication taking behavior, patient is able to correctly identify the weight gain parameters that would require MD notification Nurse Case Manager Clinical Goal(s):  Over the next 30-90 days, patient will weigh self daily and record Over the next 30-90 days patient will check blood pressure daily and record Over the next 30-90  days, patient will  verbalize understanding of Heart Failure Action Plan and when to call doctor Over the next 30-90 days, patient will take all mediations as prescribed Over the next 30-90 days, patient will notify provider for problems related to HTN, HF and PAF Over the next 30-90 days patient will attend all scheduled provider appointments  Update 11/06/20:  patient states he is checking his blood pressure daily.  Interventions:  Basic overview and discussion of pathophysiology of Heart Failure, HTN, and PAF Reiterated importance of good medication behavior and ensured patient is now taking all prescribed medications Reviewed Heart Failure Action Plan  Reviewed importance of daily weights and reminded patient of target dry weight of <310 per Dr. Sallyanne Kuster Reviewed upcoming appointments: 6/27 clinic appointment,  Patient Self Care Activities:  Take all medications as prescribed Weigh daily and record (notify MD with 3 lb weight gain over night or 5 lb in a week) Follow CHF  Action Plan - call office if I gain more than 2 pounds in one day or 5 pounds in one week - do ankle pumps when sitting - use salt in moderation - watch for swelling in feet, ankles and legs every day - weigh myself daily compare my dry weight of <310 to current weight and call MD if more than 2 lbs in one day of 5 lbs in one week or weight >310 check blood pressure daily - write blood pressure results in my phone log    Care Plan : CCM RN- Diabetes Type 2 (Adult)  Updates made by [name], RN since 11/06/2020 12:00 AM     Problem: Glycemic Management (Diabetes, Type 2)   Priority: High  Onset Date: 06/03/2020     Long-Range Goal: Glycemic Management Optimized   Start Date: 07/20/2019  Expected End Date: 02/06/2021  Recent Progress: Not on track  Priority: High  Note:    CARE PLAN ENTRY (see longitudinal plan of care for additional care plan information)  Objective:   Lab Results  Component Value Date   HGBA1C 11.1 (A) 07/18/2020   HGBA1C 9.0 (H) 03/19/2020   HGBA1C 10.1 (A) 12/12/2019   Lab Results  Component Value Date   MICROALBUR 11.3 (H) 04/17/2014   [place] 174 (H) 12/12/2019   CREATININE 1.85 (H) 07/18/2020    Current Barriers:  Knowledge Deficits related to basic Diabetes pathophysiology and self care/management Knowledge Deficits related to medications used for management of diabetes- spoke with patient to complete follow up assessment, he reports fasting blood sugars are usually less than 150 with 2 recent readings around 90, says he does not understand why his Hgb A1C was higher,   he reports good medication taking behavior, denies hypoglycemia, says he wants to try the [facility] and will discuss with his provider during June clinic appointment  Update 11/06/20:  patient states blood sugars 100s-200s, has appt with PCP 11/10/20.  Case Manager Clinical Goal(s):  Over the next 30-60  days, patient will demonstrate improved adherence to prescribed  treatment plan for diabetes self care/management as evidenced by:  daily monitoring and recording of CBG  adherence to ADA/ carb modified diet adherence to prescribed medication regimen  Interventions:  Reviewed education with patient about basic DM disease process Reviewed medications and assessed medication taking behavior Ensured patient has ample supply of all prescribed medications Discussed Hgb A1C of 11.1% and the correlation to average blood sugar Again discussed  trial and benefits of CGM, advised patient his [employer] covers both the [employer]  and the Dexcom CGM Advised patient, providing education and rationale, to check CBG at least daily and record, calling provider and/or CCM RN for findings outside established parameters. Educated patient that leg and foot pain will likely decrease if blood sugar control is improved and a CGM could provide that assistance  Discussed plans with patient for ongoing care management follow up and provided patient with direct contact information for care management team Review of patient status, including review of consultants reports, relevant laboratory and other test results, and medications completed.   Patient Self Care Activities:  UNABLE to independently self manage DM Self administers oral medications as prescribed Self administers insulin as prescribed Self administers injectable DM medication Xultophy as prescribed Attends all scheduled provider appointments Checks blood sugars as prescribed and utilize hyper and hypoglycemia protocol as needed Adheres to prescribed ADA/carb modified check blood sugar at prescribed times -heck blood sugar if I feel it is too high or too low - enter blood sugar readings and medication or insulin into my phone log - take the blood sugar log to all doctor visits - take the blood sugar meter to all doctor visits     Follow Up:  Patient agrees to Care Plan and  Follow-up.  Plan: The Managed Medicaid care management team will reach out to the patient again over the next 30 days. and The patient has been provided with contact information for the Managed Medicaid care management team and has been advised to call with any health related questions or concerns.  Date/time of next scheduled RN care management/care coordination outreach:  12/04/20 at 1030.

## 2020-11-07 ENCOUNTER — Ambulatory Visit: Payer: Self-pay

## 2020-11-08 NOTE — Progress Notes (Signed)
Office Visit   Patient ID: Terry Parks, male    DOB: December 08, 1955, 65 y.o.   MRN: [id number]   PCP: [name], MD   Subjective:  Terry Parks is a 65 y.o. year old male who presents for follow up of diabetic neuropathy. Please refer to problem based charting for assessment and plan.    Objective:   BP (!) 147/93 (BP Location: Right Arm, Patient Position: Sitting, Cuff Size: Normal)   Pulse 98   Temp 97.8 F (36.6 C) (Oral)   Ht 6\' 1"  (1.854 m)   Wt (!) 311 lb 14.4 oz (141.5 kg)   SpO2 99%   BMI 41.15 kg/m  BP Readings from Last 3 Encounters:  11/10/20 (!) 147/93  08/07/20 122/84  07/18/20 115/69   Wt Readings from Last 3 Encounters:  11/10/20 (!) 311 lb 14.4 oz (141.5 kg)  08/07/20 (!) 321 lb (145.6 kg)  07/18/20 (!) 309 lb 9.6 oz (140.4 kg)    Cardiac: RRR, trace to +1 lower extremity edema  Assessment & Plan:   Problem List Items Addressed This Visit       Cardiovascular and Mediastinum   Hypertension associated with diabetes ([place]) (Chronic)   Blood pressure is above goal in the office today 147/93. He has not been taking his blood pressure medication consistently over the past couple of weeks. He will be following up with cardiology in a couple of days. -encourage medication adherence     Chronic combined systolic and diastolic CHF (congestive heart failure) ([place]) (Chronic)    Weight is just slightly above his dry weight goal of <310#. He admits to being non-compliant with his medications for a couple of weeks this month but has restarted them. Denies worsened heart failure symptoms. Has follow up with Dr. Recardo Evangelist on 6/29. Will check a CMP in anticipation of his appointment with them.        Persistent atrial fibrillation (Chronic)    Rate controlled with coreg. Rhythm control with amiodarone. AC with xarelto. Check LFTs today in anticipation of upcoming visit with cardiology.         Endocrine   Hyperlipidemia associated with type 2  diabetes mellitus (HCC) (Chronic)    On lipitor 40mg . Check lipid panel today.       Type 2 diabetes mellitus with peripheral neuropathy (HCC) - Primary (Chronic)     DIABETES TYPE 2 FOLLOW-UP: Anti-hyperglycemic agents: Jardiance 25mg  daily, xultophy 52U daily Secondary agents: lipitor 40mg  Last A1C: 11.1 A1C today 8.2  Med Adherence:  []  Yes    []  No   [x]  Sometimes Medication side effects:  []  Yes    [x]  No  Home Monitoring?  [x]  Yes    []  No Home glucose results range: 140s-230s Diet Adherence: [x]  Yes    []  No Hypoglycemic episodes?: no Numbness of the feet? [x]  Yes    []  No Retinopathy hx? [x]  Yes    []  No Last eye exam: October 2021 with retinopathy Last foot exam negative within the past year. Comments: continues to experience peripheral neuropathy. States he was previously on gabapentin however he decided to stop taking it since the dose was not being increased.  Assessment: glycemic control is improved today in comparison to march however A1C remains above goal of <7. He was previously prescribed metformin but has not been taking it due to reading up on side effects and not wanting to take medication. I recommended increasing xultophy, however he declined, stating that his blood sugars are good.  For his peripheral neuropathy, I explained that there would be dose limitations due to his CKD however offered gabapentin, which he declined.  Plan:  -continue xultophy and jardiance -check urine microalbumin/creatinine today -f/u in 3 months with PCP for diabetic recheck -next retinal exam due to October        Relevant Medications   Insulin Degludec-Liraglutide (XULTOPHY) 100-3.6 UNIT-MG/ML SOPN   empagliflozin (JARDIANCE) 25 MG TABS tablet   Other Relevant Orders   POC Hbg A1C (Completed)   Microalbumin / Creatinine Urine Ratio     Genitourinary   Stage 3 chronic kidney disease (Chronic)    Check renal function today.       Other   Vitamin D deficiency (Chronic)     Not currently on any replacement therapy. Recheck Vitamin D level today.       Relevant Orders   Vitamin D (25 hydroxy)   Class 3 obesity (Chronic)    He is working on diet and increasing physical activity. Continue to follow. He is already on a GLP1 with xultophy.       S/P TURP (Chronic)    S/P TURP in 9628 complicated by post op incontinence. Continues to experience incontinence. He had previously followed up with [facility] however did not like their interaction. He would like another referral to be placed so he can go elsewhere for ongoing follow up and to discuss incontinence.         He will follow up with Dr. Lisabeth Devoid in September for ongoing management of his chronic medical conditions   Pt discussed with Dr. Venetia Maxon, MD Internal Medicine Resident PGY-2 [facility] Pager: [phone] 11/10/2020 11:43 AM

## 2020-11-10 ENCOUNTER — Ambulatory Visit (INDEPENDENT_AMBULATORY_CARE_PROVIDER_SITE_OTHER): Payer: Medicaid Other | Admitting: Internal Medicine

## 2020-11-10 ENCOUNTER — Encounter: Payer: Self-pay | Admitting: Internal Medicine

## 2020-11-10 ENCOUNTER — Other Ambulatory Visit (HOSPITAL_COMMUNITY): Payer: Self-pay

## 2020-11-10 ENCOUNTER — Other Ambulatory Visit: Payer: Self-pay

## 2020-11-10 VITALS — BP 147/93 | HR 98 | Temp 97.8°F | Ht 73.0 in | Wt 311.9 lb

## 2020-11-10 DIAGNOSIS — I5042 Chronic combined systolic (congestive) and diastolic (congestive) heart failure: Secondary | ICD-10-CM | POA: Diagnosis not present

## 2020-11-10 DIAGNOSIS — I4819 Other persistent atrial fibrillation: Secondary | ICD-10-CM | POA: Diagnosis not present

## 2020-11-10 DIAGNOSIS — E1142 Type 2 diabetes mellitus with diabetic polyneuropathy: Secondary | ICD-10-CM

## 2020-11-10 DIAGNOSIS — I152 Hypertension secondary to endocrine disorders: Secondary | ICD-10-CM

## 2020-11-10 DIAGNOSIS — E785 Hyperlipidemia, unspecified: Secondary | ICD-10-CM | POA: Diagnosis not present

## 2020-11-10 DIAGNOSIS — Z9079 Acquired absence of other genital organ(s): Secondary | ICD-10-CM

## 2020-11-10 DIAGNOSIS — Z0000 Encounter for general adult medical examination without abnormal findings: Secondary | ICD-10-CM | POA: Diagnosis not present

## 2020-11-10 DIAGNOSIS — E559 Vitamin D deficiency, unspecified: Secondary | ICD-10-CM | POA: Diagnosis not present

## 2020-11-10 DIAGNOSIS — E1159 Type 2 diabetes mellitus with other circulatory complications: Secondary | ICD-10-CM

## 2020-11-10 DIAGNOSIS — C61 Malignant neoplasm of prostate: Secondary | ICD-10-CM

## 2020-11-10 DIAGNOSIS — E1169 Type 2 diabetes mellitus with other specified complication: Secondary | ICD-10-CM | POA: Diagnosis not present

## 2020-11-10 DIAGNOSIS — E669 Obesity, unspecified: Secondary | ICD-10-CM

## 2020-11-10 DIAGNOSIS — N183 Chronic kidney disease, stage 3 unspecified: Secondary | ICD-10-CM | POA: Diagnosis not present

## 2020-11-10 LAB — POCT GLYCOSYLATED HEMOGLOBIN (HGB A1C): Hemoglobin A1C: 8.2 % — AB (ref 4.0–5.6)

## 2020-11-10 LAB — GLUCOSE, CAPILLARY: Glucose-Capillary: 181 mg/dL — ABNORMAL HIGH (ref 70–99)

## 2020-11-10 MED ORDER — XULTOPHY 100-3.6 UNIT-MG/ML ~~LOC~~ SOPN
52.0000 [IU] | PEN_INJECTOR | Freq: Every day | SUBCUTANEOUS | 0 refills | Status: DC
  Filled 2020-11-10: qty 165, fill #0
  Filled 2021-04-30: qty 45, 87d supply, fill #0

## 2020-11-10 MED ORDER — EMPAGLIFLOZIN 25 MG PO TABS
ORAL_TABLET | ORAL | 3 refills | Status: DC
  Filled 2020-11-10: qty 90, 90d supply, fill #0

## 2020-11-10 NOTE — Assessment & Plan Note (Addendum)
Vitamin D is 9 today.  -check PTH and phos -Start 50,000U weekly for 8w. Start after labs have been collected.  -recheck Vitamin D level at time of follow up with Dr. Lisabeth Devoid

## 2020-11-10 NOTE — Assessment & Plan Note (Signed)
S/P TURP in 1030 complicated by post op incontinence. Continues to experience incontinence. He had previously followed up with [facility] however did not like their interaction. He would like another referral to be placed so he can go elsewhere for ongoing follow up and to discuss incontinence.

## 2020-11-10 NOTE — Assessment & Plan Note (Signed)
Rate controlled with coreg. Rhythm control with amiodarone. AC with xarelto. Check LFTs today in anticipation of upcoming visit with cardiology.

## 2020-11-10 NOTE — Assessment & Plan Note (Signed)
He is working on diet and increasing physical activity. Continue to follow. He is already on a GLP1 with xultophy.

## 2020-11-10 NOTE — Assessment & Plan Note (Addendum)
DIABETES TYPE 2 FOLLOW-UP: Anti-hyperglycemic agents: Jardiance 25mg  daily, xultophy 52U daily Secondary agents: lipitor 40mg  Last A1C: 11.1 A1C today 8.2  Med Adherence:  []  Yes    []  No   [x]  Sometimes Medication side effects:  []  Yes    [x]  No  Home Monitoring?  [x]  Yes    []  No Home glucose results range: 140s-230s Diet Adherence: [x]  Yes    []  No Hypoglycemic episodes?: no Numbness of the feet? [x]  Yes    []  No Retinopathy hx? [x]  Yes    []  No Last eye exam: October 2021 with retinopathy Last foot exam negative within the past year. Comments: continues to experience peripheral neuropathy. States he was previously on gabapentin however he decided to stop taking it since the dose was not being increased.  Assessment: glycemic control is improved today in comparison to march however A1C remains above goal of <7. He was previously prescribed metformin but has not been taking it due to reading up on side effects and not wanting to take medication. I recommended increasing xultophy, however he declined, stating that his blood sugars are good.  For his peripheral neuropathy, I explained that there would be dose limitations due to his CKD however offered gabapentin, which he declined.  Plan:  -continue xultophy and jardiance -check urine microalbumin/creatinine today -f/u in 3 months with PCP for diabetic recheck -next retinal exam due to October

## 2020-11-10 NOTE — Assessment & Plan Note (Addendum)
Renal function is stable from his LOV in March however seems to be declining overall. Diabetes has been uncontrolled but is improved at his visit today. Will need to monitor his renal function closely given multiple medications including jardiance, entresto, spiro, and demadex.  He will follow up with his PCP in 3 months. If renal function continues to decline, would consider referral to nephrology.

## 2020-11-10 NOTE — Patient Instructions (Signed)
Please follow up with your PCP, Dr. Lisabeth Devoid, in September for ongoing management of your chronic medical conditions.

## 2020-11-10 NOTE — Assessment & Plan Note (Signed)
On lipitor 40mg . Check lipid panel today.

## 2020-11-10 NOTE — Assessment & Plan Note (Deleted)
S/P TURP in 0263 complicated by post op incontinence. Continues to experience incontinence. He had previously followed up with [facility] however did not like their interaction. He would like another referral to be placed so he can go elsewhere for ongoing follow up and to discuss incontinence.

## 2020-11-10 NOTE — Assessment & Plan Note (Signed)
Declines COVID, pneumonia, and shingles vaccines today.

## 2020-11-10 NOTE — Assessment & Plan Note (Signed)
Weight is just slightly above his dry weight goal of <310#. He admits to being non-compliant with his medications for a couple of weeks this month but has restarted them. Denies worsened heart failure symptoms. Has follow up with Dr. Recardo Evangelist on 6/29. Will check a CMP in anticipation of his appointment with them.

## 2020-11-11 ENCOUNTER — Other Ambulatory Visit (HOSPITAL_COMMUNITY): Payer: Self-pay

## 2020-11-11 LAB — CMP14 + ANION GAP
ALT: 16 IU/L (ref 0–44)
AST: 17 IU/L (ref 0–40)
Albumin/Globulin Ratio: 1.2 (ref 1.2–2.2)
Albumin: 4.4 g/dL (ref 3.8–4.8)
Alkaline Phosphatase: 119 IU/L (ref 44–121)
Anion Gap: 19 mmol/L — ABNORMAL HIGH (ref 10.0–18.0)
BUN/Creatinine Ratio: 9 — ABNORMAL LOW (ref 10–24)
BUN: 17 mg/dL (ref 8–27)
Bilirubin Total: 0.3 mg/dL (ref 0.0–1.2)
CO2: 22 mmol/L (ref 20–29)
Calcium: 9.1 mg/dL (ref 8.6–10.2)
Chloride: 96 mmol/L (ref 96–106)
Creatinine, Ser: 1.92 mg/dL — ABNORMAL HIGH (ref 0.76–1.27)
Globulin, Total: 3.6 g/dL (ref 1.5–4.5)
Glucose: 158 mg/dL — ABNORMAL HIGH (ref 65–99)
Potassium: 3.8 mmol/L (ref 3.5–5.2)
Sodium: 137 mmol/L (ref 134–144)
Total Protein: 8 g/dL (ref 6.0–8.5)
eGFR: 39 mL/min/{1.73_m2} — ABNORMAL LOW (ref >59)

## 2020-11-11 LAB — MICROALBUMIN / CREATININE URINE RATIO
Creatinine, Urine: 103.8 mg/dL
Microalb/Creat Ratio: 118 mg/g creat — ABNORMAL HIGH (ref 0–29)
Microalbumin, Urine: 122.4 ug/mL

## 2020-11-11 LAB — LIPID PANEL
Chol/HDL Ratio: 6.5 ratio — ABNORMAL HIGH (ref 0.0–5.0)
Cholesterol, Total: 194 mg/dL (ref 100–199)
HDL: 30 mg/dL — ABNORMAL LOW (ref >39)
LDL Chol Calc (NIH): 138 mg/dL — ABNORMAL HIGH (ref 0–99)
Triglycerides: 145 mg/dL (ref 0–149)
VLDL Cholesterol Cal: 26 mg/dL (ref 5–40)

## 2020-11-11 LAB — VITAMIN D 25 HYDROXY (VIT D DEFICIENCY, FRACTURES): Vit D, 25-Hydroxy: 8.8 ng/mL — ABNORMAL LOW (ref 30.0–100.0)

## 2020-11-11 MED ORDER — VITAMIN D (ERGOCALCIFEROL) 1.25 MG (50000 UNIT) PO CAPS
50000.0000 [IU] | ORAL_CAPSULE | ORAL | 0 refills | Status: AC
  Filled 2020-11-11: qty 12, 84d supply, fill #0

## 2020-11-11 NOTE — Addendum Note (Signed)
Addended by: [name] on: 11/11/2020 02:42 PM   Modules accepted: Orders

## 2020-11-12 ENCOUNTER — Ambulatory Visit: Payer: Medicaid Other | Admitting: Cardiovascular Disease

## 2020-11-13 NOTE — Progress Notes (Signed)
Internal Medicine Clinic Attending  Case discussed with Dr. Christian  At the time of the visit.  We reviewed the resident's history and exam and pertinent patient test results.  I agree with the assessment, diagnosis, and plan of care documented in the resident's note.  

## 2020-11-18 ENCOUNTER — Encounter: Payer: Self-pay | Admitting: *Deleted

## 2020-11-18 ENCOUNTER — Other Ambulatory Visit (INDEPENDENT_AMBULATORY_CARE_PROVIDER_SITE_OTHER): Payer: Medicaid Other

## 2020-11-18 DIAGNOSIS — E559 Vitamin D deficiency, unspecified: Secondary | ICD-10-CM

## 2020-11-19 ENCOUNTER — Other Ambulatory Visit (HOSPITAL_COMMUNITY): Payer: Self-pay

## 2020-11-19 ENCOUNTER — Other Ambulatory Visit: Payer: Medicaid Other

## 2020-11-19 LAB — PTH, INTACT AND CALCIUM
Calcium: 8.6 mg/dL (ref 8.6–10.2)
PTH: 83 pg/mL — ABNORMAL HIGH (ref 15–65)

## 2020-11-19 LAB — PHOSPHORUS: Phosphorus: 3.6 mg/dL (ref 2.8–4.1)

## 2020-11-19 NOTE — Patient Outreach (Signed)
Meds due on 11/26/20  Amlodipine   5 mg    1  Spironolactone   50 mg  1    Amiodarone   200 mg  1    Atorvastatin   40 mg    1  Torsemide   20 mg  2    Xarelto   20 mg  1    Omeprazole   40 mg      Duloxetine   60 mg  1    Jardiance   25 mg  1    Pen Needles         Lancets Carvedilol Entresto Xultophy        1 1   1  1

## 2020-11-19 NOTE — Patient Outreach (Signed)
Meter isn't working despite new battery, asked PCP to send in script for a new one. Tried to call patient to get Error reading but unable to leave VM

## 2020-11-21 ENCOUNTER — Other Ambulatory Visit: Payer: Self-pay | Admitting: Student

## 2020-11-21 MED ORDER — ACCU-CHEK AVIVA PLUS W/DEVICE KIT: 1.0000 [IU] | PACK | Freq: Three times a day (TID) | 0 refills | Status: DC

## 2020-11-21 NOTE — Patient Outreach (Signed)
Asked PCP to send in new meter

## 2020-11-24 ENCOUNTER — Other Ambulatory Visit: Payer: Self-pay | Admitting: Student

## 2020-11-24 DIAGNOSIS — E1142 Type 2 diabetes mellitus with diabetic polyneuropathy: Secondary | ICD-10-CM

## 2020-11-24 MED ORDER — GLUCOSE BLOOD VI STRP: ORAL_STRIP | 11 refills | Status: DC

## 2020-11-25 ENCOUNTER — Other Ambulatory Visit (HOSPITAL_COMMUNITY): Payer: Self-pay

## 2020-11-25 ENCOUNTER — Other Ambulatory Visit: Payer: Self-pay | Admitting: Student

## 2020-11-25 MED ORDER — ACCU-CHEK GUIDE W/DEVICE KIT: 1.0000 [IU] | PACK | Freq: Three times a day (TID) | 0 refills | Status: DC

## 2020-11-25 MED ORDER — ACCU-CHEK GUIDE VI STRP: ORAL_STRIP | 11 refills | Status: DC

## 2020-12-04 ENCOUNTER — Other Ambulatory Visit: Payer: Self-pay | Admitting: Obstetrics and Gynecology

## 2020-12-04 NOTE — Patient Outreach (Signed)
Care Coordination  12/04/2020  Terry Parks Dec 14, 1955 [id number]   Medicaid Managed Care   Unsuccessful Outreach Note  12/04/2020 Name: Terry Parks MRN: [id number] DOB: 1956/02/11  Referred by: [name], MD Reason for referral : High Risk Managed Medicaid (Unsuccessful telephone outreach)   An unsuccessful telephone outreach was attempted today. The patient was referred to the case management team for assistance with care management and care coordination.   Follow Up Plan: A member of the Managed Medicaid care management team will reach out to the patient again over the next 7-14 days.   [name] RN, BSN [place]  [name] - Managed Medicaid High Risk [phone].

## 2020-12-04 NOTE — Patient Instructions (Signed)
Hi Terry Parks, sorry I missed you today- as a part of your Medicaid benefit, you are eligible for care management and care coordination services at no cost or copay. I was unable to reach you by phone today but would be happy to help you with your health related needs. Please feel free to call me at [phone].  A member of the Managed Medicaid care management team will reach out to you again over the next 7-14 days.   [name] RN, BSN [place]  [name] - Managed Medicaid High Risk [phone].

## 2020-12-25 ENCOUNTER — Other Ambulatory Visit: Payer: Self-pay | Admitting: Obstetrics and Gynecology

## 2020-12-25 ENCOUNTER — Other Ambulatory Visit: Payer: Self-pay

## 2020-12-25 NOTE — Patient Instructions (Addendum)
Hi Terry Parks, thank you for speaking with me today.  Terry Parks was given information about Medicaid Managed Care team care coordination services as a part of their Healthy [facility] Medicaid benefit. Terry Parks verbally consented to engagement with the [facility] Managed Care team.   If you are experiencing a medical emergency, please call 911 or report to your local emergency department or urgent care.   If you have a non-emergency medical problem during routine business hours, please contact your provider's office and ask to speak with a nurse.   For questions related to your Healthy [facility] health plan, please call: [phone] or visit the homepage here: [email or web address]  If you would like to schedule transportation through your Healthy [facility] plan, please call the following number at least 2 days in advance of your appointment: [phone]  Call the [place] at [phone], at any time, 24 hours a day, 7 days a week. If you are in danger or need immediate medical attention call 911.  If you would like help to quit smoking, call 1-800-QUIT-NOW 253-427-9946) OR Espaol: 1-855-Djelo-Ya HD:1601594) o para ms informacin haga clic aqu or Text READY to 200-400 to register via text  Terry Parks - following are the goals we discussed in your visit today:   Goals Addressed             This Visit's Progress    Monitor and Manage My Blood Sugar-Diabetes Type 2       Timeframe:  Long-Range Goal Priority:  High Start Date:        06/03/20                     Expected End Date:   ongoing               Follow Up Date:  01/25/21   - check blood sugar at prescribed times - check blood sugar if I feel it is too high or too low - enter blood sugar readings and medication or insulin into my phone log - take the blood sugar log to all doctor visits - take the blood sugar meter to all doctor visits     Why is this important?   Checking your blood sugar at home helps to keep it from getting very high or very low.  Writing the results in a diary or log helps the doctor know how to care for you.  Your blood sugar log should have the time, date and the results.  Also, write down the amount of insulin or other medicine that you take.  Other information, like what you ate, exercise done and how you were feeling, will also be helpful.     Notes:- most fasting CBGs meeting target but Hgb A1C has increased so discussed CGM with patient Update 11/06/20:  Patient's blood sugars 100's per patient, has appointment with PCP 11/10/20. Update 12/25/20:  Blood sugar 111 yesterday and 191 today with a goal <  150.     Protect My Health       Timeframe:  Long-Range Goal Priority:  Medium Start Date:      10/07/20                       Expected End Date:  ongoing                Follow Up Date:  01/25/21   - schedule appointment for flu shot -  schedule appointment for vaccines needed due to my age or health - schedule recommended health tests. - schedule and keep appointment for annual check-up  Update 11/06/20:  Patient has appointment with PCP 11/10/20. Update 12/25/20:  Patient states he has not received referral from PCP for urologist-will follow up with PCP at patient request.   Why is this important?   Screening tests can find diseases early when they are easier to treat.  Your doctor or nurse will talk with you about which tests are important for you.  Getting shots for common diseases like the flu and shingles will help prevent them.        Track and Manage Fluids and Swelling-Heart Failure       Timeframe:  Long-Range Goal Priority:  High Start Date:          01/14/20                   Expected End Date:   ongoing     Follow Up Date:  01/25/21   - call office if I gain more than 2 pounds in one day or 5 pounds in one week - do ankle pumps when sitting - use salt in moderation - watch for  swelling in feet, ankles and legs every day - weigh myself daily  - log weight in my phone   Why is this important?   It is important to check your weight daily and watch how much salt and liquids you have.  It will help you to manage your heart failure.    Notes: patient states he is weighing everyday, discussed dry weight goal of <310 lbs Update 11/06/20:  Patient states he has appointment with PCP 11/10/20. Update 12/25/20:  patient states he weighs everyday-weight today is 300, states he has lost 15 pounds.     Track and Manage My Blood Pressure-Hypertension       Timeframe:  Long-Range Goal Priority:  High Start Date:     06/03/20                        Expected End Date:  ongoing                 Follow Up Date:  01/25/21 - check blood pressure daily - write blood pressure results in my phone log    Why is this important?   You won't feel high blood pressure, but it can still hurt your blood vessels.  High blood pressure can cause heart or kidney problems. It can also cause a stroke.  Making lifestyle changes like losing a little weight or eating less salt will help.  Checking your blood pressure at home and at different times of the day can help to control blood pressure.  If the doctor prescribes medicine remember to take it the way the doctor ordered.  Call the office if you cannot afford the medicine or if there are questions about it.     Notes: patient checks BP everyday and records in his smart phone; meeting treatment targets Update 11/06/20:  Patient states he is checking blood pressure daily, 130-140/90.  Has appointment with PCP 11/10/20. Update 12/25/20, Blood pressure 111/59 today.        Patient verbalizes understanding of instructions provided today.   The Managed Medicaid care management team will reach out to the patient again over the next 30 days.  The  Patient  has been provided with contact information for the  Managed Medicaid care management team and has been advised to call with any health related questions or concerns.   [name] RN, BSN [place]  [name] - Managed Medicaid High Risk [phone].    Following is a copy of your plan of care:  Patient Care Plan: CCM RN- Diabetes Type 2 (Adult)     Problem Identified: Glycemic Management (Diabetes, Type 2)   Priority: High  Onset Date: 06/03/2020     Long-Range Goal: Glycemic Management Optimized   Start Date: 07/20/2019  Expected End Date: 02/06/2021  Recent Progress: Not on track  Priority: High  Note:    CARE PLAN ENTRY (see longitudinal plan of care for additional care plan information)  Objective:   Lab Results  Component Value Date   HGBA1C 11.1 (A) 07/18/2020   HGBA1C 9.0 (H) 03/19/2020   HGBA1C 10.1 (A) 12/12/2019   Lab Results  Component Value Date   MICROALBUR 11.3 (H) 04/17/2014   LDLCALC 174 (H) 12/12/2019   CREATININE 1.85 (H) 07/18/2020     Current Barriers:  Knowledge Deficits related to basic Diabetes pathophysiology and self care/management Knowledge Deficits related to medications used for management of diabetes- spoke with patient to complete follow up assessment, he reports fasting blood sugars are usually less than 150 with 2 recent readings around 90, says he does not understand why his Hgb A1C was higher,   he reports good medication taking behavior, denies hypoglycemia, says he wants to try the [facility] and will discuss with his provider during June clinic appointment  Update 11/06/20:  patient states blood sugars 100s-200s, has appt with PCP 11/10/20. Update 12/25/20:  Blood sugars 111-191 per patient.  Case Manager Clinical Goal(s):  Over the next 30-60  days, patient will demonstrate improved adherence to prescribed treatment plan for diabetes self care/management as evidenced by:  daily monitoring and recording of CBG  adherence to ADA/ carb  modified diet adherence to prescribed medication regimen  Interventions:  Reviewed education with patient about basic DM disease process Reviewed medications and assessed medication taking behavior Ensured patient has ample supply of all prescribed medications Discussed Hgb A1C of 11.1% and the correlation to average blood sugar Again discussed  trial and benefits of CGM, advised patient his Healthy [employer] covers both the [employer]  and the Dexcom CGM Advised patient, providing education and rationale, to check CBG at least daily and record, calling provider and/or CCM RN for findings outside established parameters. Educated patient that leg and foot pain will likely decrease if blood sugar control is improved and a CGM could provide that assistance  Discussed plans with patient for ongoing care management follow up and provided patient with direct contact information for care management team Review of patient status, including review of consultants reports, relevant laboratory and other test results, and medications completed.   Patient Self Care Activities:  UNABLE to independently self manage DM Self administers oral medications as prescribed Self administers insulin as prescribed Self administers injectable DM medication Xultophy as prescribed Attends all scheduled provider appointments Checks blood sugars as prescribed and utilize hyper and hypoglycemia protocol as needed Adheres to prescribed ADA/carb modified check blood sugar at prescribed times - check blood sugar if I feel it is too high or too low - enter blood sugar readings and medication or insulin into my phone log - take the blood sugar log to all doctor visits -  take the blood sugar meter to all doctor visits      Patient Care Plan: General Plan of Care (Adult)     Problem Identified: Health Promotion or Disease Self-Management (General Plan of Care)   Priority: Medium  Onset Date: 10/07/2020      Long-Range Goal: Self-Management Plan Developed   Start Date: 10/07/2020  Expected End Date: 03/27/2021  Recent Progress: Not on track  Priority: Medium  Note:   Current Barriers:  Ineffective Self Health Maintenance Patient with complaints of leg and foot pain related to neuropathy-states he stopped taking Gabapentin because it was not working-patient would like a referral to a neurologist.  Patient also complaining of incontinence and would like referral to a different urologist. Currently UNABLE TO independently self manage needs related to chronic health conditions.  Knowledge Deficits related to short term plan for care coordination needs and long term plans for chronic disease management needs Nurse Case Manager Clinical Goal(s):  patient will work with care management team to address care coordination and chronic disease management needs related to Disease Management Educational Needs Care Coordination Medication Management and Education Medication Reconciliation Medication Assistance  Psychosocial Support   Interventions:  Evaluation of current treatment plan and patient's adherence to plan as established by provider. Reviewed medications with patient. Collaborated with pharmacy regarding referrals. Update 12/25/20:  Patient working with Pharmacist, states he received his medication delivery today. Collaborated with PCP for referrals. Update 12/25/20:  Patient states he has not received urology referral from PCP-will refer again. Discussed plans with patient for ongoing care management follow up and provided patient with direct contact information for care management team Advised patient, providing education and rationale, to monitor blood pressure daily and record, calling provider for findings outside established parameters.  Reviewed scheduled/upcoming provider appointments. Advised patient, providing education and rationale, to check cbg and record, calling provider for  findings outside established parameters.   Pharmacy referral for medication review. Self Care Activities:  Patient will self administer medications as prescribed Patient will attend all scheduled provider appointments Patient will call pharmacy for medication refills Patient will continue to perform ADL's independently Patient will call provider office for new concerns or questions Patient Goals: In the next 30 days, patient will meet with Pharmacist for medication review. In the next 30 days, patient will receive referrals from PCP for neurologist and urologist. In the next 30 days, patient will attend all appointments. - Follow Up Plan: The patient has been provided with contact information for the care management team and has been advised to call with any health related questions or concerns.  The care management team will reach out to the patient again over the next 30 days.    Evidence-based guidance:   Review biopsychosocial determinants of health screens.  Review need for preventive screening based on age, sex, family history and health history.  Determine level of modifiable health risk.  Discuss identified risks.  Identify areas where behavior change may lead to improved health.  Promote healthy lifestyle.  Evoke change talk using open-ended questions, pros and cons, as well as looking forward.  Identify and manage conditions or preconditions to reduce health risk.  Implement additional goals and interventions based on identified risk factors.

## 2021-03-10 ENCOUNTER — Other Ambulatory Visit: Payer: Medicaid Other | Admitting: Obstetrics and Gynecology

## 2021-03-10 ENCOUNTER — Other Ambulatory Visit: Payer: Self-pay

## 2021-03-10 ENCOUNTER — Other Ambulatory Visit: Payer: Self-pay | Admitting: Obstetrics and Gynecology

## 2021-03-10 NOTE — Patient Outreach (Signed)
Care Coordination  03/10/2021  Ruddy Swire [birth year] [id number]   Medicaid Managed Care   Unsuccessful Outreach Note  03/10/2021 Name: Dainel Arcidiacono MRN: [id number] DOB: May 29, 1955  Referred by: [name], MD Reason for referral : High Risk Managed Medicaid (Unsuccessful telephone call)   A second unsuccessful telephone outreach was attempted today. The patient was referred to the case management team for assistance with care management and care coordination.   Follow Up Plan: The care management team will reach out to the patient again over the next 7 days.   [name] RN, BSN [place]  [name] - Managed Medicaid High Risk [phone].

## 2021-03-10 NOTE — Patient Outreach (Signed)
Care Coordination  03/10/2021  Terry Parks 07/06/1955 [id number]  RNCM returned patient's phone call, no answer-left message.  [name] RN, BSN [place]  [name] - Managed Medicaid High Risk [phone]

## 2021-03-10 NOTE — Patient Instructions (Signed)
Hi Mr. Montesdeoca, I am sorry I missed you today, I hope you are doing okay- as a part of your Medicaid benefit, you are eligible for care management and care coordination services at no cost or copay. I was unable to reach you by phone today but would be happy to help you with your health related needs. Please feel free to call me at [phone].  A member of the Managed Medicaid care management team will reach out to you again over the next 7 days.   [name] RN, BSN [place]  [name] - Managed Medicaid High Risk [phone].

## 2021-03-12 ENCOUNTER — Telehealth: Payer: Self-pay | Admitting: Student

## 2021-03-12 NOTE — Telephone Encounter (Signed)
..   Medicaid Managed Care   Unsuccessful Outreach Note  03/12/2021 Name: Terry Parks MRN: [id number] DOB: 1956/02/17  Referred by: [name], MD Reason for referral : High Risk Managed Medicaid (I called the patient today to get him rescheduled for a phone visit with the [place]. I left my name and number on his VM.)   An unsuccessful telephone outreach was attempted today. The patient was referred to the case management team for assistance with care management and care coordination.   Follow Up Plan: The care management team will reach out to the patient again over the next 7 days.    [place]

## 2021-03-19 ENCOUNTER — Other Ambulatory Visit: Payer: Self-pay | Admitting: Internal Medicine

## 2021-03-19 DIAGNOSIS — E1142 Type 2 diabetes mellitus with diabetic polyneuropathy: Secondary | ICD-10-CM

## 2021-03-20 ENCOUNTER — Ambulatory Visit: Payer: Medicaid Other | Admitting: Cardiovascular Disease

## 2021-03-20 ENCOUNTER — Encounter: Payer: Self-pay | Admitting: Cardiovascular Disease

## 2021-03-20 ENCOUNTER — Other Ambulatory Visit: Payer: Self-pay

## 2021-03-20 VITALS — BP 115/81 | HR 88 | Ht 73.0 in | Wt 298.0 lb

## 2021-03-20 DIAGNOSIS — I428 Other cardiomyopathies: Secondary | ICD-10-CM

## 2021-03-20 DIAGNOSIS — Z7901 Long term (current) use of anticoagulants: Secondary | ICD-10-CM | POA: Diagnosis not present

## 2021-03-20 DIAGNOSIS — I4819 Other persistent atrial fibrillation: Secondary | ICD-10-CM | POA: Diagnosis not present

## 2021-03-20 DIAGNOSIS — E1142 Type 2 diabetes mellitus with diabetic polyneuropathy: Secondary | ICD-10-CM

## 2021-03-20 DIAGNOSIS — Z79899 Other long term (current) drug therapy: Secondary | ICD-10-CM

## 2021-03-20 DIAGNOSIS — G4733 Obstructive sleep apnea (adult) (pediatric): Secondary | ICD-10-CM

## 2021-03-20 DIAGNOSIS — Z5181 Encounter for therapeutic drug level monitoring: Secondary | ICD-10-CM | POA: Diagnosis not present

## 2021-03-20 DIAGNOSIS — E785 Hyperlipidemia, unspecified: Secondary | ICD-10-CM

## 2021-03-20 DIAGNOSIS — I10 Essential (primary) hypertension: Secondary | ICD-10-CM

## 2021-03-20 DIAGNOSIS — I5042 Chronic combined systolic (congestive) and diastolic (congestive) heart failure: Secondary | ICD-10-CM | POA: Diagnosis not present

## 2021-03-20 DIAGNOSIS — N183 Chronic kidney disease, stage 3 unspecified: Secondary | ICD-10-CM

## 2021-03-20 DIAGNOSIS — Z6839 Body mass index (BMI) 39.0-39.9, adult: Secondary | ICD-10-CM

## 2021-03-20 NOTE — Patient Instructions (Signed)
Medication Instructions:  No changes *If you need a refill on your cardiac medications before your next appointment, please call your pharmacy*   Lab Work: Your provider would like for you to return in 2 months to have the following labs drawn: Lipid, TSH, CMET and A1C. You do not need an appointment for the lab. Once in our office lobby there is a podium where you can sign in and ring the doorbell to alert [place] that you are here. The lab is open from 8:00 am to 4:30 pm; closed for lunch from 12:45pm-1:45pm.  If you have labs (blood work) drawn today and your tests are completely normal, you will receive your results only by: [place] (if you have MyChart) OR A paper copy in the mail If you have any lab test that is abnormal or we need to change your treatment, we will call you to review the results.   Testing/Procedures: None ordered   Follow-Up: At [facility], you and your health needs are our priority.  As part of our continuing mission to provide you with exceptional heart care, we have created designated Provider Care Teams.  These Care Teams include your primary Cardiologist (physician) and Advanced Practice Providers (APPs -  Physician Assistants and Nurse Practitioners) who all work together to provide you with the care you need, when you need it.  We recommend signing up for the patient portal called "MyChart".  Sign up information is provided on this After Visit Summary.  MyChart is used to connect with patients for Virtual Visits (Telemedicine).  Patients are able to view lab/test results, encounter notes, upcoming appointments, etc.  Non-urgent messages can be sent to your provider as well.   To learn more about what you can do with MyChart, go to [email or web address].    Your next appointment:   6 month(s)  The format for your next appointment:   In Person  Provider:   [name], MD {

## 2021-03-20 NOTE — Progress Notes (Signed)
Patient ID: Kian Ottaviano, male   DOB: 1955/10/01, 65 y.o.   MRN: [id number]    Cardiology Office Note    Date:  03/21/2021   ID:  Ignatius Kloos, DOB 1955-08-30, MRN [id number]  PCP:  [name], MD  Cardiologist:   [name], MD   No chief complaint on file.   History of Present Illness:  Hazael Olveda is a 65 y.o. male with  combined systolic and diastolic heart failure (LVEF 35-40% by most recent echo October 2020) paroxysmal atrial fibrillation on amiodarone and Xarelto, HTN, OSA, DM. He has never had catheterization and has not had angina pectoris, but a nuclear stress test in 2013 showed a fixed anterior defect.  He has systemic hypertension, severe obesity, obstructive sleep apnea on CPAP, and diabetes mellitus. The diagnosis of heart failure dates back to at least 2013 when he had a stress test that showed a fixed anterior defect and an ejection fraction of 30-35%.  He was hospitalized with acute heart failure exacerbation in January 2019, in the setting of acute kidney injury secondary to nephrolithiasis with ureteral obstruction.  During that same admission he had atrial fibrillation, treated with cardioversion but followed by recurrence on atrial flutter, treated with amiodarone and anticoagulation with rivaroxaban.  He last required hospitalization for heart failure exacerbation in March 2021.  Discharge weight was 318 pounds (only 44 kg and discharge creatinine was 1.57 (baseline 1.3-1.4).  He developed transient acute kidney injury during the hospitalization for incisional hernia repair November 2021, but his kidney function recovered rapidly.  He is actually doing pretty well compared with his past history.  He has not required hospitalization for heart failure this year, nor has there been any significant change in his diuretic prescription.  He denies orthopnea, PND, dyspnea with usual activity.  Seems to be NYHA functional class II.  He does not have any lower  extremity edema.  He denies angina at rest or with activity.  Has not had palpitations, dizziness or syncope.  There have been no episodes of atrial fibrillation that he is aware of.  He has symptoms of diabetic neuropathy.  He has not had any falls, injuries or serious bleeding problems.  He reports compliance with Xarelto.  His current weight is substantially lower than her previous estimated "dry weight".  He has been losing weight while taking Jardiance, but still requires insulin products (xultophy).  I hope we have managed to clarify the medications he supposed to take.  He is on maximum usual dose of carvedilol and Entresto as well as a moderate dose of torsemide and spironolactone.  He is on a diabetic dose of Jardiance (25 mg).  He still needs amlodipine to keep his blood pressure in good range.   Past Medical History:  Diagnosis Date   Abscessed tooth    top back large cavity no pain or drainage, one on bottom  pt pulled tooth 4-5 months ago, right top large hole in tooth   AKI (acute kidney injury) ([place])    Allergic rhinitis    Anemia    Anxiety    Asthma    Atrial fibrillation (HCC)    BPH (benign prostatic hypertrophy)    Massive BPH noted on cystoscopy 1/23/ 2012 by Dr. Risa Grill.   Cancer [facility])    prostate cancer 2019   Cardiomyopathy [facility])    CHF (congestive heart failure) ([place])    Cough 03/30/2012   Depression    Diabetes mellitus 04/08/2008   type 2  Dyspnea    Dysrhythmia 2019   Foley catheter in place 07-05-17 placed   Fracture, orbital ([place]) 2021   Right   GERD (gastroesophageal reflux disease) 2020   Headache(784.0)    hx migraines none recent   History of esophagitis 12/16/2019   Hyperlipemia    Hypertension    Hypertensive cardiopathy 03/01/2006   2-D echocardiogram 02/01/2012 showed moderate LVH, mildly to moderately reduced left ventricular systolic function with an estimated ejection fraction of 40-45%, and diffuse hypokinesis.  A nuclear medicine stress  study done 01/31/2012 showed no reversible ischemia, a small mid anterior wall fixed defect/infarct, and ejection fraction 42%.       Neck pain    Nephrolithiasis 05/29/2010   CT scan of abdomen/pelvis on 05/29/2010 showed an obstructing approximate 1-2 mm calculus at the left UVJ, and an approximate 1-2 mm left lower pole renal calculus.   Patient had continuing severe pain , and an elevation of his serum creatinine to a value of 1.75 on 06/06/2010.  Patient underwent cystoscopy on 06/08/2010 by Dr. Risa Grill, but attempts at retrograde pyelogram and ureteroscopy were unsucc   Numbness 01/08/2018   Obstructive sleep apnea 03/06/2008   Sleep study 03/06/08 showed severe OSA/hypopnea syndrome, with successful CPAP titration to 13 CWP using a medium ResMed Mirage Quattro full face mask with heated humidifier.    Rash 04/17/2014   Renal calculus 05/29/2010   CT scan of abdomen/pelvis on 05/29/2010 showed an obstructing approximate 1-2 mm calculus at the left UVJ, and an approximate 1-2 mm left lower pole renal calculus.   Patient had continuing severe pain , and an elevation of his serum creatinine to a value of 1.75 on 06/06/2010.  The stone had apparently passed and was not seen on repeat CT 06/08/2010.   Sleep apnea    haven't use cpap in 2 years   Tooth pain 10/29/2017   Urinary straining 11/02/2016    Past Surgical History:  Procedure Laterality Date   big toe nails removed Bilateral 20 yrs ago   CARDIOVERSION N/A 06/08/2017   Procedure: CARDIOVERSION;  Surgeon: Josue Hector, MD;  Location: [place];  Service: Cardiovascular;  Laterality: N/A;   COLONOSCOPY     CYSTOSCOPY W/ RETROGRADES     FRACTURE SURGERY Right 2021   5th right phalange   INCISIONAL HERNIA REPAIR N/A 03/24/2020   Procedure: LAPAROSCOPIC INCISIONAL HERNIA REPAR WITH MESH;  Surgeon: Georganna Skeans, MD;  Location: [place];  Service: General;  Laterality: N/A;   IR RADIOLOGIST EVAL & MGMT  02/02/2018   LYMPHADENECTOMY Bilateral  10/06/2017   Procedure: LYMPHADENECTOMY;  Surgeon: Lucas Mallow, MD;  Location: [facility];  Service: Urology;  Laterality: Bilateral;   ROBOT ASSISTED LAPAROSCOPIC RADICAL PROSTATECTOMY N/A 10/06/2017   Procedure: XI ROBOTIC ASSISTED LAPAROSCOPIC RADICAL PROSTATECTOMY;  Surgeon: Lucas Mallow, MD;  Location: [facility];  Service: Urology;  Laterality: N/A;   TRANSURETHRAL RESECTION OF BLADDER TUMOR N/A 07/13/2017   Procedure: TRANSURETHRAL RESECTION OF PROSTATE;  Surgeon: Lucas Mallow, MD;  Location: [facility];  Service: Urology;  Laterality: N/A;    Outpatient Medications Prior to Visit  Medication Sig Dispense Refill   Accu-Chek Softclix Lancets lancets Check blood sugar three times a day as instructed 300 each 3   amiodarone (PACERONE) 200 MG tablet Take 1 tablet (200 mg total) by mouth daily. 60 tablet 6   amLODipine (NORVASC) 5 MG tablet Take 5 mg by mouth daily.     atorvastatin (LIPITOR) 40  MG tablet Take 1 tablet (40 mg total) by mouth daily. 90 tablet 3   Blood Glucose Monitoring Suppl (ACCU-CHEK GUIDE) w/Device KIT 1 Units by Does not apply route 3 (three) times daily. 1 kit 0   carvedilol (COREG) 25 MG tablet Take 1 tablet (25 mg total) by mouth 2 (two) times daily with a meal. 60 tablet 6   DULoxetine (CYMBALTA) 60 MG capsule Take 1 capsule (60 mg total) by mouth daily. 90 capsule 3   empagliflozin (JARDIANCE) 25 MG TABS tablet TAKE 1 TABLET (25 MG TOTAL) BY MOUTH DAILY BEFORE BREAKFAST. 90 tablet 3   glucose blood (ACCU-CHEK GUIDE) test strip Use as instructed to check blood sugar 3 times daily 300 each 11   Insulin Degludec-Liraglutide (XULTOPHY) 100-3.6 UNIT-MG/ML SOPN Inject 52 Units into the skin daily. 165 mL 0   omeprazole (PRILOSEC) 40 MG capsule Take 1 capsule (40 mg total) by mouth 2 (two) times daily with a meal. 180 capsule 3   rivaroxaban (XARELTO) 20 MG TABS tablet Take 1 tablet (20 mg total) by mouth daily with breakfast. 90 tablet 3   sacubitril-valsartan  (ENTRESTO) 97-103 MG Take 1 tablet by mouth 2 (two) times daily. 60 tablet 6   spironolactone (ALDACTONE) 50 MG tablet Take 1 tablet (50 mg total) by mouth daily. 30 tablet 6   torsemide (DEMADEX) 20 MG tablet TAKE 2 TABLETS (40 MG TOTAL) BY MOUTH DAILY. 60 tablet 5   TRUEPLUS 5-BEVEL PEN NEEDLES 31G X 6 MM MISC USE AS DIRECTED AT BEDTIME. 100 each 0   No facility-administered medications prior to visit.     Allergies:   Lisinopril   Social History   Socioeconomic History   Marital status: Single    Spouse name: Not on file   Number of children: 4   Years of education: 16   Highest education level: Not on file  Occupational History   Occupation:        Employer: UNEMPLOYED  Tobacco Use   Smoking status: Never   Smokeless tobacco: Never  Vaping Use   Vaping Use: Never used  Substance and Sexual Activity   Alcohol use: No    Alcohol/week: 0.0 standard drinks   Drug use: No   Sexual activity: Not Currently  Other Topics Concern   Not on file  Social History Narrative   Divorced, 4 children, lives alone.     Social Determinants of Health   Financial Resource Strain: Low Risk    Difficulty of Paying Living Expenses: Not hard at all  Food Insecurity: No Food Insecurity   Worried About Charity fundraiser in the Last Year: Never true   [place] in the Last Year: Never true  Transportation Needs: No Transportation Needs   Lack of Transportation (Medical): No   Lack of Transportation (Non-Medical): No  Physical Activity: Inactive   Days of Exercise per Week: 0 days   Minutes of Exercise per Session: 0 min  Stress: No Stress Concern Present   Feeling of Stress : Not at all  Social Connections: Unknown   Frequency of Communication with Friends and Family: More than three times a week   Frequency of Social Gatherings with Friends and Family: More than three times a week   Attends Religious Services: More than 4 times per year   Active Member of [employer] or  Organizations: No   Attends Archivist Meetings: Never   Marital Status: Not on file     Family  History:  The patient's family history includes Breast cancer in his mother; Diabetes in his maternal grandmother; Hypertension in his father.   ROS:   Please see the history of present illness.    All other systems are reviewed and are negative.   PHYSICAL EXAM:   VS:  BP 115/81   Pulse 88   Ht '6\' 1"'  (1.854 m)   Wt 298 lb (135.2 kg)   SpO2 99%   BMI 39.32 kg/m      General: Alert, oriented x3, no distress, severely obese Head: no evidence of trauma, PERRL, EOMI, no exophtalmos or lid lag, no myxedema, no xanthelasma; normal ears, nose and oropharynx Neck: normal jugular venous pulsations and no hepatojugular reflux; brisk carotid pulses without delay and no carotid bruits Chest: clear to auscultation, no signs of consolidation by percussion or palpation, normal fremitus, symmetrical and full respiratory excursions Cardiovascular: normal position and quality of the apical impulse, regular rhythm, normal first and second heart sounds, no murmurs, rubs or gallops Abdomen: no tenderness or distention, no masses by palpation, no abnormal pulsatility or arterial bruits, normal bowel sounds, no hepatosplenomegaly Extremities: no clubbing, cyanosis or edema; 2+ radial, ulnar and brachial pulses bilaterally; 2+ right femoral, posterior tibial and dorsalis pedis pulses; 2+ left femoral, posterior tibial and dorsalis pedis pulses; no subclavian or femoral bruits Neurological: grossly nonfocal Psych: Normal mood and affect General: Alert, oriented x3, no distress, Morbidly obese. Head: no evidence of trauma, PERRL, EOMI, no exophtalmos or lid lag, no myxedema, no xanthelasma; normal ears, nose and oropharynx Neck: normal jugular venous pulsations and no hepatojugular reflux; brisk carotid pulses without delay and no carotid bruits Chest: clear to auscultation, no signs of consolidation by  percussion or palpation, normal fremitus, symmetrical and full respiratory excursions Cardiovascular: normal position and quality of the apical impulse, regular rhythm, normal first and second heart sounds, no murmurs, rubs or gallops Abdomen: no tenderness or distention, no masses by palpation, no abnormal pulsatility or arterial bruits, normal bowel sounds, no hepatosplenomegaly Extremities: no clubbing, cyanosis or edema; 2+ radial, ulnar and brachial pulses bilaterally; 2+ right femoral, posterior tibial and dorsalis pedis pulses; 2+ left femoral, posterior tibial and dorsalis pedis pulses; no subclavian or femoral bruits Neurological: grossly nonfocal Psych: Normal mood and affect   Wt Readings from Last 3 Encounters:  03/20/21 298 lb (135.2 kg)  11/10/20 (!) 311 lb 14.4 oz (141.5 kg)  08/07/20 (!) 321 lb (145.6 kg)      Studies/Labs Reviewed:   EKG:  EKG is ordered today and shows normal sinus rhythm with generalized low voltage due to obesity.  QTc 481 ms (on amiodarone). Recent Labs: 03/25/2020: Hemoglobin 12.7; Platelets 182 11/10/2020: ALT 16; BUN 17; Creatinine, Ser 1.92; Potassium 3.8; Sodium 137   Lipid Panel    Component Value Date/Time   CHOL 194 11/10/2020 1123   TRIG 145 11/10/2020 1123   HDL 30 (L) 11/10/2020 1123   CHOLHDL 6.5 (H) 11/10/2020 1123   CHOLHDL 3.9 01/09/2018 0509   VLDL 11 01/09/2018 0509   LDLCALC 138 (H) 11/10/2020 1123     ASSESSMENT:    1. Chronic combined systolic and diastolic CHF (congestive heart failure) (HCC)   2. Persistent atrial fibrillation ([place])   3. Encounter for monitoring amiodarone therapy   4. Long term (current) use of anticoagulants   5. Essential hypertension   6. Nonischemic cardiomyopathy ([place])   7. OSA (obstructive sleep apnea)   8. Type 2 diabetes mellitus with peripheral neuropathy (HCC)  9. Stage 3 chronic kidney disease, unspecified whether stage 3a or 3b CKD ([place])   10. Class 2 severe obesity due to excess  calories with serious comorbidity and body mass index (BMI) of 39.0 to 39.9 in adult ([place])   11. Dyslipidemia (high LDL; low HDL)       PLAN:  In order of problems listed above:  CHF: NYHA functional class I-2.  Clinically euvolemic without need for recent diuretic dose adjustment.  He has been steadily losing weight, probably since he started [place].  We will reset his "dry weight" at 300 pounds or less).  He needs to have follow-up lab tests.   AFib: Without any symptoms to suggest interim atrial fibrillation.  On chronic amiodarone and reports compliance with Xarelto.  CHADSVasc 3-4 (HTN, CHF, DM, +/- CAD) on anticoagulation. Amiodarone monitoring: Reminded him of the need for routine lab monitoring with a multiple highly active medications that he takes, which can have serious side effects.  He had normal liver function tests in June.  He is due a TSH. Anticoagulation: Denies bleeding complications.  Need to reassess his renal function.  Most recent creatinine was 1.9 (GFR 39).  If this has remained in the same range, may need to decrease his dose of Xarelto to 15 mg daily. HTN: Excellent control. CMP: Etiology may be mixed ischemic/nonischemic, but nonischemic cardiomyopathy is probably the major component.  He has never had coronary angiography, since his creatinine is abnormal, compliance has been poor, he has never had angina pectoris and his nuclear stress test has never shown a reversible defect.  Even though a remote nuclear stress test showed an anterior defect, the echo shows global left ventricular hypokinesis and he does not have any Q waves on ECG.   His LVEF seems to vary in direct relationship to the success of blood pressure control.   OSA: He reports compliance with CPAP.  He denies daytime hypersomnolence. DM: Complicated by retinopathy and nephropathy.  Most recent hemoglobin A1c markedly improved at 8.2%. CKD 3a: Recheck labs.  Previously had estimated that his baseline  creatinine is around 1.3, but this year his creatinine values have been 1.85-1.92. Obesity: I am encouraged by his success with weight loss.  He remains severely obese. HLP: Lipid profile in June of this year showed an LDL cholesterol of 138, but I do not think he was taking his statin at that time.  When he was compliant with statin in the past, his LDL was in the 60-70s. HDL chronically low.  The following statement remains true: He has a complicated medical condition and is on a very complicated list of medications with varying effects his metabolic profile.  It is important for him to be seen in the clinic frequently, especially since he is still has limited understanding of the purpose of some of his medications.  Also important recheck his renal function and electrolytes frequently.  He has shown poor compliance with recommendation for lab draws.   Medication Adjustments/Labs and Tests Ordered: Current medicines are reviewed at length with the patient today.  Concerns regarding medicines are outlined above.  Medication changes, Labs and Tests ordered today are listed in the Patient Instructions below. Patient Instructions  Medication Instructions:  No changes *If you need a refill on your cardiac medications before your next appointment, please call your pharmacy*   Lab Work: Your provider would like for you to return in 2 months to have the following labs drawn: Lipid, TSH, CMET and A1C. You  do not need an appointment for the lab. Once in our office lobby there is a podium where you can sign in and ring the doorbell to alert [place] that you are here. The lab is open from 8:00 am to 4:30 pm; closed for lunch from 12:45pm-1:45pm.  If you have labs (blood work) drawn today and your tests are completely normal, you will receive your results only by: [place] (if you have MyChart) OR A paper copy in the mail If you have any lab test that is abnormal or we need to change your treatment, we  will call you to review the results.   Testing/Procedures: None ordered   Follow-Up: At [facility], you and your health needs are our priority.  As part of our continuing mission to provide you with exceptional heart care, we have created designated Provider Care Teams.  These Care Teams include your primary Cardiologist (physician) and Advanced Practice Providers (APPs -  Physician Assistants and Nurse Practitioners) who all work together to provide you with the care you need, when you need it.  We recommend signing up for the patient portal called "MyChart".  Sign up information is provided on this After Visit Summary.  MyChart is used to connect with patients for Virtual Visits (Telemedicine).  Patients are able to view lab/test results, encounter notes, upcoming appointments, etc.  Non-urgent messages can be sent to your provider as well.   To learn more about what you can do with MyChart, go to [email or web address].    Your next appointment:   6 month(s)  The format for your next appointment:   In Person  Provider:   [name], MD {   Signed, [name], MD  03/21/2021 7:22 PM    [facility] [address], [place], [place]  [address] Phone: [phone]; Fax: [phone]

## 2021-03-25 ENCOUNTER — Other Ambulatory Visit: Payer: Self-pay | Admitting: Obstetrics and Gynecology

## 2021-03-25 NOTE — Patient Outreach (Signed)
Care Coordination  03/25/2021  Terry Parks Jun 17, 1955 [id number]  RNCM returned patient's phone call.  Patient wanted phone numbers for transportation services.  Patient given [facility] and [employer] transportation phone numbers.  All questions answered.  [name] RN, BSN [place]  [name] - Managed Medicaid High Risk [phone].

## 2021-03-31 LAB — HM DIABETES EYE EXAM

## 2021-04-02 ENCOUNTER — Other Ambulatory Visit: Payer: Self-pay

## 2021-04-02 ENCOUNTER — Other Ambulatory Visit: Payer: Medicaid Other | Admitting: Obstetrics and Gynecology

## 2021-04-02 NOTE — Patient Outreach (Signed)
Care Coordination  04/02/2021  Tremain Rucinski 01-Mar-1956 [id number]  RNCM returned patient's phone call.  Patient called requesting phone number for Dr. [name] so he could make an appointment.  Patient provided with phone number, [phone].  All questions answered.  [name] RN, BSN [place]  [name] - Managed Medicaid High Risk [phone].

## 2021-04-12 ENCOUNTER — Other Ambulatory Visit: Payer: Self-pay | Admitting: Cardiovascular Disease

## 2021-04-12 DIAGNOSIS — I5042 Chronic combined systolic (congestive) and diastolic (congestive) heart failure: Secondary | ICD-10-CM

## 2021-04-15 ENCOUNTER — Encounter: Payer: Self-pay | Admitting: Dietician

## 2021-04-16 LAB — COMPREHENSIVE METABOLIC PANEL
ALT: 17 IU/L (ref 0–44)
AST: 12 IU/L (ref 0–40)
Albumin/Globulin Ratio: 1.3 (ref 1.2–2.2)
Albumin: 4 g/dL (ref 3.8–4.8)
Alkaline Phosphatase: 95 IU/L (ref 44–121)
BUN/Creatinine Ratio: 9 — ABNORMAL LOW (ref 10–24)
BUN: 17 mg/dL (ref 8–27)
Bilirubin Total: 0.4 mg/dL (ref 0.0–1.2)
CO2: 27 mmol/L (ref 20–29)
Calcium: 8.7 mg/dL (ref 8.6–10.2)
Chloride: 99 mmol/L (ref 96–106)
Creatinine, Ser: 1.91 mg/dL — ABNORMAL HIGH (ref 0.76–1.27)
Globulin, Total: 3 g/dL (ref 1.5–4.5)
Glucose: 145 mg/dL — ABNORMAL HIGH (ref 70–99)
Potassium: 4 mmol/L (ref 3.5–5.2)
Sodium: 140 mmol/L (ref 134–144)
Total Protein: 7 g/dL (ref 6.0–8.5)
eGFR: 39 mL/min/{1.73_m2} — ABNORMAL LOW (ref >59)

## 2021-04-16 LAB — LIPID PANEL
Chol/HDL Ratio: 5.8 ratio — ABNORMAL HIGH (ref 0.0–5.0)
Cholesterol, Total: 179 mg/dL (ref 100–199)
HDL: 31 mg/dL — ABNORMAL LOW (ref >39)
LDL Chol Calc (NIH): 122 mg/dL — ABNORMAL HIGH (ref 0–99)
Triglycerides: 143 mg/dL (ref 0–149)
VLDL Cholesterol Cal: 26 mg/dL (ref 5–40)

## 2021-04-16 LAB — HEMOGLOBIN A1C
Est. average glucose Bld gHb Est-mCnc: 166 mg/dL
Hgb A1c MFr Bld: 7.4 % — ABNORMAL HIGH (ref 4.8–5.6)

## 2021-04-16 LAB — TSH: TSH: 0.839 u[IU]/mL (ref 0.450–4.500)

## 2021-04-17 ENCOUNTER — Other Ambulatory Visit: Payer: Self-pay | Admitting: *Deleted

## 2021-04-17 DIAGNOSIS — Z79899 Other long term (current) drug therapy: Secondary | ICD-10-CM

## 2021-04-17 DIAGNOSIS — I5042 Chronic combined systolic (congestive) and diastolic (congestive) heart failure: Secondary | ICD-10-CM

## 2021-04-17 DIAGNOSIS — Z5181 Encounter for therapeutic drug level monitoring: Secondary | ICD-10-CM

## 2021-04-17 DIAGNOSIS — E1169 Type 2 diabetes mellitus with other specified complication: Secondary | ICD-10-CM

## 2021-04-17 DIAGNOSIS — E785 Hyperlipidemia, unspecified: Secondary | ICD-10-CM

## 2021-04-17 MED ORDER — EZETIMIBE 10 MG PO TABS: 10.0000 mg | ORAL_TABLET | Freq: Every day | ORAL | 3 refills | Status: DC

## 2021-04-20 ENCOUNTER — Telehealth: Payer: Self-pay

## 2021-04-20 NOTE — Telephone Encounter (Signed)
Pa was sent through from cover my meds for pt ( XULTOPHY )  but once pulled up the message read as followed :     Message from Plan   The member recently filled this medication and will be able to return for their next refill according to their plan limits.

## 2021-04-24 ENCOUNTER — Other Ambulatory Visit: Payer: Self-pay

## 2021-04-24 ENCOUNTER — Other Ambulatory Visit: Payer: Self-pay | Admitting: Obstetrics and Gynecology

## 2021-04-24 DIAGNOSIS — E1142 Type 2 diabetes mellitus with diabetic polyneuropathy: Secondary | ICD-10-CM

## 2021-04-24 NOTE — Patient Instructions (Signed)
Hi Terry Parks, thank you for speaking with me today, have a great day!!  Terry Parks was given information about Medicaid Managed Care team care coordination services as a part of their Healthy [facility] Medicaid benefit. Terry Parks verbally consented to engagement with the [facility] Managed Care team.   If you are experiencing a medical emergency, please call 911 or report to your local emergency department or urgent care.   If you have a non-emergency medical problem during routine business hours, please contact your provider's office and ask to speak with a nurse.   For questions related to your Healthy [facility] health plan, please call: [phone] or visit the homepage here: [email or web address]  If you would like to schedule transportation through your Healthy [facility] plan, please call the following number at least 2 days in advance of your appointment: [phone]  Call the [place] at [phone], at any time, 24 hours a day, 7 days a week. If you are in danger or need immediate medical attention call 911.  If you would like help to quit smoking, call 1-800-QUIT-NOW 423-629-5874) OR Espaol: 1-855-Djelo-Ya (7-673-419-3790) o para ms informacin haga clic aqu or Text READY to 200-400 to register via text  Terry Parks - following are the goals we discussed in your visit today:   Goals Addressed             This Visit's Progress    Monitor and Manage My Blood Sugar-Diabetes Type 2       Timeframe:  Long-Range Goal Priority:  High Start Date:        06/03/20                     Expected End Date:   ongoing               Follow Up Date:  05/25/21   - check blood sugar at prescribed times - check blood sugar if I feel it is too high or too low - enter blood sugar readings and medication or insulin into my phone log - take the blood sugar log to all doctor visits - take the blood sugar meter to all doctor  visits    Why is this important?   Checking your blood sugar at home helps to keep it from getting very high or very low.  Writing the results in a diary or log helps the doctor know how to care for you.  Your blood sugar log should have the time, date and the results.  Also, write down the amount of insulin or other medicine that you take.  Other information, like what you ate, exercise done and how you were feeling, will also be helpful.     Notes:-  04/24/21:  Patient continues to check blood sugar daily, 131 today.     Protect My Health       Timeframe:  Long-Range Goal Priority:  Medium Start Date:      10/07/20                       Expected End Date:  ongoing                Follow Up Date:  05/25/21   - schedule appointment for flu shot - schedule appointment for vaccines needed due to my age or health - schedule recommended health tests. - schedule and keep appointment for annual check-up  04/24/21:  patient recently seen and  evaluated by Urologist at [facility] pleased.   Why is this important?   Screening tests can find diseases early when they are easier to treat.  Your doctor or nurse will talk with you about which tests are important for you.  Getting shots for common diseases like the flu and shingles will help prevent them.       Track and Manage Fluids and Swelling-Heart Failure       Timeframe:  Long-Range Goal Priority:  High Start Date:          01/14/20                   Expected End Date:   ongoing     Follow Up Date:  05/25/21   - call office if I gain more than 2 pounds in one day or 5 pounds in one week - do ankle pumps when sitting - use salt in moderation - watch for swelling in feet, ankles and legs every day - weigh myself daily  - log weight in my phone   Why is this important?   It is important to check your weight daily and watch how much salt and liquids you have.  It will help you to manage your heart failure.    Notes:04/24/21:   Patient walking when he cans, states he climbs stairs quite a bit during the day as he lives upstairs.  Weighing daily.     Track and Manage My Blood Pressure-Hypertension       Timeframe:  Long-Range Goal Priority:  High Start Date:     06/03/20                        Expected End Date:  ongoing                 Follow Up Date: 05/25/21 - check blood pressure daily - write blood pressure results in my phone log    Why is this important?   You won't feel high blood pressure, but it can still hurt your blood vessels.  High blood pressure can cause heart or kidney problems. It can also cause a stroke.  Making lifestyle changes like losing a little weight or eating less salt will help.  Checking your blood pressure at home and at different times of the day can help to control blood pressure.  If the doctor prescribes medicine remember to take it the way the doctor ordered.  Call the office if you cannot afford the medicine or if there are questions about it.     Notes:  04/24/21:  Checks blood pressure daily, 106/76 today    The patient verbalized understanding of instructions provided today and declined a print copy of patient instruction materials.   The Managed Medicaid care management team will reach out to the patient again over the next 30 days.  The  Patient has been provided with contact information for the Managed Medicaid care management team and has been advised to call with any health related questions or concerns.   [name] RN, BSN [place]  [name] - Managed Medicaid High Risk [phone].   Following is a copy of your plan of care:  Care Plan : CCM RN- Diabetes Type 2 (Adult)  Updates made by [name], RN since 04/24/2021 12:00 AM     Problem: Glycemic Management (Diabetes, Type 2)   Priority: High  Onset Date: 06/03/2020  Long-Range Goal: Glycemic Management Optimized   Start Date: 07/20/2019  Expected  End Date: 07/23/2021  Recent Progress: Not on track  Priority: High  Note:    CARE PLAN ENTRY (see longitudinal plan of care for additional care plan information)  Objective:   Lab Results  Component Value Date   HGBA1C 11.1 (A) 07/18/2020   HGBA1C 9.0 (H) 03/19/2020   HGBA1C 10.1 (A) 12/12/2019   Lab Results  Component Value Date   MICROALBUR 11.3 (H) 04/17/2014   LDLCALC 174 (H) 12/12/2019   CREATININE 1.85 (H) 07/18/2020     Current Barriers:  Knowledge Deficits related to basic Diabetes pathophysiology and self care/management Knowledge Deficits related to medications used for management of diabetes-  04/24/21:  patient checks blood sugar daily-131 today  Case Manager Clinical Goal(s):  Over the next 30-60  days, patient will demonstrate improved adherence to prescribed treatment plan for diabetes self care/management as evidenced by:  daily monitoring and recording of CBG  adherence to ADA/ carb modified diet adherence to prescribed medication regimen  Interventions:  Reviewed education with patient about basic DM disease process Reviewed medications and assessed medication taking behavior Ensured patient has ample supply of all prescribed medications Discussed Hgb A1C of 11.1% and the correlation to average blood sugar Again discussed  trial and benefits of CGM, advised patient his Healthy [employer] covers both the [employer]  and the Dexcom CGM Advised patient, providing education and rationale, to check CBG at least daily and record, calling provider and/or CCM RN for findings outside established parameters. Educated patient that leg and foot pain will likely decrease if blood sugar control is improved and a CGM could provide that assistance  Discussed plans with patient for ongoing care management follow up and provided patient with direct contact information for care management team Review of patient status, including review of consultants reports,  relevant laboratory and other test results, and medications completed.   Patient Self Care Activities:  UNABLE to independently self manage DM Self administers oral medications as prescribed Self administers insulin as prescribed Self administers injectable DM medication Xultophy as prescribed Attends all scheduled provider appointments Checks blood sugars as prescribed and utilize hyper and hypoglycemia protocol as needed Adheres to prescribed ADA/carb modified check blood sugar at prescribed times - check blood sugar if I feel it is too high or too low - enter blood sugar readings and medication or insulin into my phone log - take the blood sugar log to all doctor visits - take the blood sugar meter to all doctor visits   Care Plan : CCM RN- Chronic Pain (Adult)- lower extremeties  Updates made by [name], RN since 04/24/2021 12:00 AM     Problem: Chronic Pain Management (Chronic Pain)   Priority: High  Onset Date: 07/15/2020     Long-Range Goal: Chronic Pain Managed   Start Date: 07/15/2020  Expected End Date: 07/15/2021  Recent Progress: On track  Priority: High  Note:   Current Barriers:  Knowledge Deficits related to self-health management of chronic lower extremity pain Chronic Disease Management support and education needs related to chronic pain 04/24/21:  No complaints today. Clinical Goal(s):  patient will verbalize understanding of plan for pain management.  and patient will use pharmacological and nonpharmacological pain relief strategies as prescribed.  Interventions:  Collaboration with [name], MD regarding development and update of comprehensive plan of care as evidenced by provider attestation and co-signature Pain assessment performed Medications reviewed Discussed plans  with patient for ongoing care management follow up and provided patient with direct contact information for care management team Discussed improvement  in DM control will likely  result in less pain in legs and feet  Patient Goals/Self Care Activities:  Will self-administer medications as prescribed Will attend all scheduled provider appointments Will call pharmacy for medication refills 7 days prior to needed refill date Patient will calls provider office for new concerns or questions Follow Up Plan: The care management team will reach out to the patient again over the next 30-60 days.    Care Plan : General Plan of Care (Adult)  Updates made by [name], RN since 04/24/2021 12:00 AM     Problem: Health Promotion or Disease Self-Management (General Plan of Care)   Priority: Medium  Onset Date: 10/07/2020     Long-Range Goal: Self-Management Plan Developed   Start Date: 10/07/2020  Expected End Date: 06/25/2021  Recent Progress: Not on track  Priority: Medium  Note:   Current Barriers:  Ineffective Self Health Maintenance Patient needs aide and eyeglasses Currently UNABLE TO independently self manage needs related to chronic health conditions.  Knowledge Deficits related to short term plan for care coordination needs and long term plans for chronic disease management needs Nurse Case Manager Clinical Goal(s):  patient will work with care management team to address care coordination and chronic disease management needs related to Disease Management Educational Needs Care Coordination Medication Management and Education Medication Reconciliation Medication Assistance  Psychosocial Support   Interventions:  Evaluation of current treatment plan and patient's adherence to plan as established by provider. Reviewed medications with patient. Collaborated with pharmacy regarding referrals. Collaborated with PCP for referrals. Collaborated with Care Guide for eyeglass provider that accepts patient's insurance Care Guide referral for eyeglass provider Discussed plans with patient for ongoing care management follow up and provided patient with direct contact  information for care management team Advised patient, providing education and rationale, to monitor blood pressure daily and record, calling provider for findings outside established parameters.  Reviewed scheduled/upcoming provider appointments. Advised patient, providing education and rationale, to check cbg and record, calling provider for findings outside established parameters.   Pharmacy referral for medication review. Self Care Activities:  Patient will self administer medications as prescribed Patient will attend all scheduled provider appointments Patient will call pharmacy for medication refills Patient will continue to perform ADL's independently Patient will call provider office for new concerns or questions Patient Goals: In the next 30 days, patient will meet with Pharmacist for medication review. In the next 30 days, patient will call [facility] for aide. In the next 30 days, patient will attend all appointments. - Follow Up Plan: The patient has been provided with contact information for the care management team and has been advised to call with any health related questions or concerns.  The care management team will reach out to the patient again over the next 30 days.    Evidence-based guidance:   Review biopsychosocial determinants of health screens.  Review need for preventive screening based on age, sex, family history and health history.  Determine level of modifiable health risk.  Discuss identified risks.  Identify areas where behavior change may lead to improved health.  Promote healthy lifestyle.  Evoke change talk using open-ended questions, pros and cons, as well as looking forward.  Identify and manage conditions or preconditions to reduce health risk.  Implement additional goals and interventions based on identified risk factors.

## 2021-04-24 NOTE — Patient Outreach (Signed)
Medicaid Managed Care   Nurse Care Manager Note  04/24/2021 Name:  Terry Parks MRN:  [id number] DOB:  1955-08-17  Terry Parks is an 65 y.o. year old male who is a primary patient of Terry Beard, MD.  The [facility] Managed Care Coordination team was consulted for assistance with:    Chronic healthcare management needs  Terry Parks was given information about Medicaid Managed Care Coordination team services today. Terry Parks Patient agreed to services and verbal consent obtained.  Engaged with patient by telephone for follow up visit in response to provider referral for case management and/or care coordination services.   Assessments/Interventions:  Review of past medical history, allergies, medications, health status, including review of consultants reports, laboratory and other test data, was performed as part of comprehensive evaluation and provision of chronic care management services.  SDOH (Social Determinants of Health) assessments and interventions performed: SDOH Interventions    Flowsheet Row Most Recent Value  SDOH Interventions   Housing Interventions Intervention Not Indicated       Care Plan  Allergies  Allergen Reactions   Lisinopril Cough    Medications Reviewed Today     Reviewed by [name], RN (Registered Nurse) on 04/24/21 at 20  Med List Status: <None>   Medication Order Taking? Sig Documenting Provider Last Dose Status Informant  Accu-Chek Softclix Lancets lancets [id number] No Check blood sugar three times a day as instructed [name], MD Taking Active   amiodarone (PACERONE) 200 MG tablet [id number] No Take 1 tablet (200 mg total) by mouth daily. [name], [name], MD Taking Active   amLODipine (NORVASC) 5 MG tablet [id number]  Take 5 mg by mouth daily. [provider]  Active   atorvastatin (LIPITOR) 40 MG tablet [id number] No Take 1 tablet (40 mg total) by mouth daily. [name], MD Taking Expired 03/20/21 2359    Blood Glucose Monitoring Suppl (ACCU-CHEK GUIDE) w/Device KIT [id number] No 1 Units by Does not apply route 3 (three) times daily. Terry Beard, MD Taking Active   carvedilol (COREG) 25 MG tablet [id number] No Take 1 tablet (25 mg total) by mouth 2 (two) times daily with a meal. [name], [name], MD Taking Active   DULoxetine (CYMBALTA) 60 MG capsule [id number] No Take 1 capsule (60 mg total) by mouth daily. [name], MD Taking Active   empagliflozin (JARDIANCE) 25 MG TABS tablet [id number] No TAKE 1 TABLET (25 MG TOTAL) BY MOUTH DAILY BEFORE BREAKFAST. [name], MD Taking Active   ezetimibe (ZETIA) 10 MG tablet [id number]  Take 1 tablet (10 mg total) by mouth daily. [name], [name], MD  Active   glucose blood (ACCU-CHEK GUIDE) test strip [id number] No Use as instructed to check blood sugar 3 times daily Terry Beard, MD Taking Active   Insulin Degludec-Liraglutide (XULTOPHY) 100-3.6 UNIT-MG/ML SOPN [id number] No Inject 52 Units into the skin daily. [name], MD Taking Active   omeprazole (PRILOSEC) 40 MG capsule [id number] No Take 1 capsule (40 mg total) by mouth 2 (two) times daily with a meal. [name], MD Taking Active   rivaroxaban (XARELTO) 20 MG TABS tablet [id number] No Take 1 tablet (20 mg total) by mouth daily with breakfast. [name], [name], MD Taking Active   sacubitril-valsartan (ENTRESTO) 97-103 MG [id number] No Take 1 tablet by mouth 2 (two) times daily. [name], [name], MD Taking Active   spironolactone (ALDACTONE) 50 MG tablet [id number] No Take 1 tablet (50 mg total) by mouth daily. [name], [name], MD Taking Active   torsemide (DEMADEX) 20 MG  tablet [id number]  TAKE TWO TABLETS BY MOUTH EVERY MORNING [name], [name], MD  Active   TRUEPLUS 5-BEVEL PEN NEEDLES 31G X 6 MM MISC [id number] No USE AS DIRECTED AT BEDTIME. [name], MD Taking Active             Patient Active Problem List   Diagnosis Date Noted   Class 3 obesity ([place]) 11/10/2020   S/P  TURP 11/10/2020   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   Depression 01/31/2019   Iron deficiency anemia 04/28/2018   CKD stage 3 due to type 2 diabetes mellitus ([place]) 02/11/2018   Healthcare maintenance 02/11/2018   Persistent atrial fibrillation    Vitamin D deficiency 06/22/2016   Allergic rhinitis 10/19/2012   Diabetic retinopathy ([place]) 10/04/2012   Type 2 diabetes mellitus with peripheral neuropathy ([place]) 04/08/2008   Obstructive sleep apnea    Morbid obesity ([place])    Hyperlipidemia associated with type 2 diabetes mellitus ([place])    Hypertension associated with diabetes ([place])    Chronic combined systolic and diastolic CHF (congestive heart failure) ([place]) 03/01/2006    Conditions to be addressed/monitored per PCP order:   chronic healthcare management needs, HTN, DM, CHF, HLD, OSA, CKD, vision loss right eye  Care Plan : CCM RN- Diabetes Type 2 (Adult)  Updates made by [name], RN since 04/24/2021 12:00 AM     Problem: Glycemic Management (Diabetes, Type 2)   Priority: High  Onset Date: 06/03/2020     Long-Range Goal: Glycemic Management Optimized   Start Date: 07/20/2019  Expected End Date: 07/23/2021  Recent Progress: Not on track  Priority: High  Note:    CARE PLAN ENTRY (see longitudinal plan of care for additional care plan information)  Objective:   Lab Results  Component Value Date   HGBA1C 11.1 (A) 07/18/2020   HGBA1C 9.0 (H) 03/19/2020   HGBA1C 10.1 (A) 12/12/2019   Lab Results  Component Value Date   MICROALBUR 11.3 (H) 04/17/2014   LDLCALC 174 (H) 12/12/2019   CREATININE 1.85 (H) 07/18/2020     Current Barriers:  Knowledge Deficits related to basic Diabetes pathophysiology and self care/management Knowledge Deficits related to medications used for management of diabetes-  04/24/21:  patient checks blood sugar daily-131 today  Case Manager Clinical Goal(s):  Over the next 30-60  days, patient will demonstrate improved adherence to prescribed  treatment plan for diabetes self care/management as evidenced by:  daily monitoring and recording of CBG  adherence to ADA/ carb modified diet adherence to prescribed medication regimen  Interventions:  Reviewed education with patient about basic DM disease process Reviewed medications and assessed medication taking behavior Ensured patient has ample supply of all prescribed medications Discussed Hgb A1C of 11.1% and the correlation to average blood sugar Again discussed  trial and benefits of CGM, advised patient his [employer] covers both the [employer]  and the Dexcom CGM Advised patient, providing education and rationale, to check CBG at least daily and record, calling provider and/or CCM RN for findings outside established parameters. Educated patient that leg and foot pain will likely decrease if blood sugar control is improved and a CGM could provide that assistance  Discussed plans with patient for ongoing care management follow up and provided patient with direct contact information for care management team Review of patient status, including review of consultants reports, relevant laboratory and other test results, and medications completed.   Patient Self Care Activities:  UNABLE to independently self manage  DM Self administers oral medications as prescribed Self administers insulin as prescribed Self administers injectable DM medication Xultophy as prescribed Attends all scheduled provider appointments Checks blood sugars as prescribed and utilize hyper and hypoglycemia protocol as needed Adheres to prescribed ADA/carb modified check blood sugar at prescribed times - check blood sugar if I feel it is too high or too low - enter blood sugar readings and medication or insulin into my phone log - take the blood sugar log to all doctor visits - take the blood sugar meter to all doctor visits     Care Plan : CCM RN- Chronic Pain (Adult)- lower  extremeties  Updates made by [name], RN since 04/24/2021 12:00 AM     Problem: Chronic Pain Management (Chronic Pain)   Priority: High  Onset Date: 07/15/2020     Long-Range Goal: Chronic Pain Managed   Start Date: 07/15/2020  Expected End Date: 07/15/2021  Recent Progress: On track  Priority: High  Note:   Current Barriers:  Knowledge Deficits related to self-health management of chronic lower extremity pain Chronic Disease Management support and education needs related to chronic pain 04/24/21:  No complaints today. Clinical Goal(s):  patient will verbalize understanding of plan for pain management.  and patient will use pharmacological and nonpharmacological pain relief strategies as prescribed.  Interventions:  Collaboration with Terry Beard, MD regarding development and update of comprehensive plan of care as evidenced by provider attestation and co-signature Pain assessment performed Medications reviewed Discussed plans with patient for ongoing care management follow up and provided patient with direct contact information for care management team Discussed improvement  in DM control will likely result in less pain in legs and feet  Patient Goals/Self Care Activities:  Will self-administer medications as prescribed Will attend all scheduled provider appointments Will call pharmacy for medication refills 7 days prior to needed refill date Patient will calls provider office for new concerns or questions Follow Up Plan: The care management team will reach out to the patient again over the next 30-60 days.      Care Plan : General Plan of Care (Adult)  Updates made by [name], RN since 04/24/2021 12:00 AM     Problem: Health Promotion or Disease Self-Management (General Plan of Care)   Priority: Medium  Onset Date: 10/07/2020     Long-Range Goal: Self-Management Plan Developed   Start Date: 10/07/2020  Expected End Date: 06/25/2021  Recent Progress: Not on track   Priority: Medium  Note:   Current Barriers:  Ineffective Self Health Maintenance Patient needs aide and eyeglasses Currently UNABLE TO independently self manage needs related to chronic health conditions.  Knowledge Deficits related to short term plan for care coordination needs and long term plans for chronic disease management needs Nurse Case Manager Clinical Goal(s):  patient will work with care management team to address care coordination and chronic disease management needs related to Disease Management Educational Needs Care Coordination Medication Management and Education Medication Reconciliation Medication Assistance  Psychosocial Support   Interventions:  Evaluation of current treatment plan and patient's adherence to plan as established by provider. Reviewed medications with patient. Collaborated with pharmacy regarding referrals. Collaborated with PCP for referrals. Collaborated with Care Guide for eyeglass provider that accepts patient's insurance Care Guide referral for eyeglass provider Discussed plans with patient for ongoing care management follow up and provided patient with direct contact information for care management team Advised patient, providing education and rationale, to monitor blood pressure daily and  record, calling provider for findings outside established parameters.  Reviewed scheduled/upcoming provider appointments. Advised patient, providing education and rationale, to check cbg and record, calling provider for findings outside established parameters.   Pharmacy referral for medication review. Self Care Activities:  Patient will self administer medications as prescribed Patient will attend all scheduled provider appointments Patient will call pharmacy for medication refills Patient will continue to perform ADL's independently Patient will call provider office for new concerns or questions Patient Goals: In the next 30 days, patient will meet  with Pharmacist for medication review. In the next 30 days, patient will call [facility] for aide. In the next 30 days, patient will attend all appointments. - Follow Up Plan: The patient has been provided with contact information for the care management team and has been advised to call with any health related questions or concerns.  The care management team will reach out to the patient again over the next 30 days.    Evidence-based guidance:   Review biopsychosocial determinants of health screens.  Review need for preventive screening based on age, sex, family history and health history.  Determine level of modifiable health risk.  Discuss identified risks.  Identify areas where behavior change may lead to improved health.  Promote healthy lifestyle.  Evoke change talk using open-ended questions, pros and cons, as well as looking forward.  Identify and manage conditions or preconditions to reduce health risk.  Implement additional goals and interventions based on identified risk factors.     Follow Up:  Patient agrees to Care Plan and Follow-up.  Plan: The Managed Medicaid care management team will reach out to the patient again over the next 30 days. and The  Patient has been provided with contact information for the Managed Medicaid care management team and has been advised to call with any health related questions or concerns.  Date/time of next scheduled RN care management/care coordination outreach:  05/25/21 at 1030

## 2021-04-30 ENCOUNTER — Other Ambulatory Visit (HOSPITAL_COMMUNITY): Payer: Self-pay

## 2021-05-05 DIAGNOSIS — H5213 Myopia, bilateral: Secondary | ICD-10-CM | POA: Diagnosis not present

## 2021-05-07 DIAGNOSIS — R32 Unspecified urinary incontinence: Secondary | ICD-10-CM | POA: Diagnosis not present

## 2021-05-07 DIAGNOSIS — E119 Type 2 diabetes mellitus without complications: Secondary | ICD-10-CM | POA: Diagnosis not present

## 2021-05-07 DIAGNOSIS — I10 Essential (primary) hypertension: Secondary | ICD-10-CM | POA: Diagnosis not present

## 2021-05-14 ENCOUNTER — Other Ambulatory Visit: Payer: Self-pay

## 2021-05-14 NOTE — Patient Instructions (Signed)
Visit Information  Terry Parks was given information about Medicaid Managed Care team care coordination services as a part of their Healthy [facility] Medicaid benefit. Terry Parks verbally consented to engagement with the [facility] Managed Care team.   If you are experiencing a medical emergency, please call 911 or report to your local emergency department or urgent care.   If you have a non-emergency medical problem during routine business hours, please contact your provider's office and ask to speak with a nurse.   For questions related to your Healthy [facility] health plan, please call: [phone] or visit the homepage here: [email or web address]  If you would like to schedule transportation through your Healthy [facility] plan, please call the following number at least 2 days in advance of your appointment: [phone]  Call the [place] at [phone], at any time, 24 hours a day, 7 days a week. If you are in danger or need immediate medical attention call 911.  If you would like help to quit smoking, call 1-800-QUIT-NOW 539-488-1511) OR Espaol: 1-855-Djelo-Ya (7-681-157-2620) o para ms informacin haga clic aqu or Text READY to 200-400 to register via text  Terry Parks - following are the goals we discussed in your visit today:   Goals Addressed   None     Social Worker will follow up in 7 days .   [name] Mickel Fuchs, BSW, [place] Managed Medicaid Team  [phone]   Following is a copy of your plan of care:  There are no care plans that you recently modified to display for this patient.

## 2021-05-14 NOTE — Patient Outreach (Signed)
Care Coordination  05/14/2021  Keene Gilkey 12-Oct-1955 [id number]   Medicaid Managed Care   Unsuccessful Outreach Note  05/14/2021 Name: Terry Parks MRN: [id number] DOB: Jan 11, 1956  Referred by: [name], MD Reason for referral : High Risk Managed Medicaid (MM Social Work [employer])   An unsuccessful telephone outreach was attempted today. The patient was referred to the case management team for assistance with care management and care coordination.   Follow Up Plan: The care management team will reach out to the patient again over the next 7 days.

## 2021-05-19 ENCOUNTER — Ambulatory Visit: Payer: Medicaid Other | Admitting: Gastroenterology

## 2021-05-20 ENCOUNTER — Other Ambulatory Visit (INDEPENDENT_AMBULATORY_CARE_PROVIDER_SITE_OTHER): Payer: Medicaid Other

## 2021-05-20 ENCOUNTER — Telehealth: Payer: Self-pay

## 2021-05-20 ENCOUNTER — Encounter: Payer: Self-pay | Admitting: Gastroenterology

## 2021-05-20 ENCOUNTER — Other Ambulatory Visit: Payer: Self-pay

## 2021-05-20 ENCOUNTER — Ambulatory Visit (INDEPENDENT_AMBULATORY_CARE_PROVIDER_SITE_OTHER): Payer: Medicaid Other | Admitting: Gastroenterology

## 2021-05-20 ENCOUNTER — Other Ambulatory Visit (HOSPITAL_COMMUNITY): Payer: Self-pay

## 2021-05-20 VITALS — BP 96/70 | HR 77 | Ht 73.0 in | Wt 305.1 lb

## 2021-05-20 DIAGNOSIS — K469 Unspecified abdominal hernia without obstruction or gangrene: Secondary | ICD-10-CM | POA: Diagnosis not present

## 2021-05-20 DIAGNOSIS — R12 Heartburn: Secondary | ICD-10-CM | POA: Diagnosis not present

## 2021-05-20 DIAGNOSIS — K458 Other specified abdominal hernia without obstruction or gangrene: Secondary | ICD-10-CM

## 2021-05-20 DIAGNOSIS — K30 Functional dyspepsia: Secondary | ICD-10-CM

## 2021-05-20 DIAGNOSIS — Z862 Personal history of diseases of the blood and blood-forming organs and certain disorders involving the immune mechanism: Secondary | ICD-10-CM

## 2021-05-20 DIAGNOSIS — R1033 Periumbilical pain: Secondary | ICD-10-CM

## 2021-05-20 DIAGNOSIS — R1013 Epigastric pain: Secondary | ICD-10-CM | POA: Diagnosis not present

## 2021-05-20 LAB — COMPREHENSIVE METABOLIC PANEL
ALT: 12 U/L (ref 0–53)
AST: 11 U/L (ref 0–37)
Albumin: 3.8 g/dL (ref 3.5–5.2)
Alkaline Phosphatase: 84 U/L (ref 39–117)
BUN: 14 mg/dL (ref 6–23)
CO2: 26 mEq/L (ref 19–32)
Calcium: 8.7 mg/dL (ref 8.4–10.5)
Chloride: 103 mEq/L (ref 96–112)
Creatinine, Ser: 1.7 mg/dL — ABNORMAL HIGH (ref 0.40–1.50)
GFR: 42.35 mL/min — ABNORMAL LOW (ref >60.00)
Glucose, Bld: 181 mg/dL — ABNORMAL HIGH (ref 70–99)
Potassium: 4.3 mEq/L (ref 3.5–5.1)
Sodium: 136 mEq/L (ref 135–145)
Total Bilirubin: 0.5 mg/dL (ref 0.2–1.2)
Total Protein: 7.3 g/dL (ref 6.0–8.3)

## 2021-05-20 LAB — CBC
HCT: 46 % (ref 39.0–52.0)
Hemoglobin: 14.9 g/dL (ref 13.0–17.0)
MCHC: 32.4 g/dL (ref 30.0–36.0)
MCV: 89.6 fl (ref 78.0–100.0)
Platelets: 185 10*3/uL (ref 150.0–400.0)
RBC: 5.14 Mil/uL (ref 4.22–5.81)
RDW: 14.5 % (ref 11.5–15.5)
WBC: 7.1 10*3/uL (ref 4.0–10.5)

## 2021-05-20 LAB — IBC + FERRITIN
Ferritin: 83.3 ng/mL (ref 22.0–322.0)
Iron: 60 ug/dL (ref 42–165)
Saturation Ratios: 16.8 % — ABNORMAL LOW (ref 20.0–50.0)
TIBC: 357 ug/dL (ref 250.0–450.0)
Transferrin: 255 mg/dL (ref 212.0–360.0)

## 2021-05-20 LAB — LIPASE: Lipase: 21 U/L (ref 11.0–59.0)

## 2021-05-20 MED ORDER — SUCRALFATE 1 GM/10ML PO SUSP
1.0000 g | Freq: Four times a day (QID) | ORAL | 1 refills | Status: DC
Start: 2021-05-20 — End: 2022-05-13
  Filled 2021-05-20: qty 420, 11d supply, fill #0

## 2021-05-20 MED ORDER — ESOMEPRAZOLE MAGNESIUM 40 MG PO CPDR
40.0000 mg | DELAYED_RELEASE_CAPSULE | Freq: Every day | ORAL | 1 refills | Status: DC
Start: 2021-05-20 — End: 2021-06-09
  Filled 2021-05-20: qty 90, 90d supply, fill #0

## 2021-05-20 NOTE — Patient Instructions (Signed)
Your provider has requested that you go to the basement level for lab work before leaving today. Press "B" on the elevator. The lab is located at the first door on the left as you exit the elevator.  We have sent the following medications to your pharmacy for you to pick up at your convenience: Nexium, Carafate  You have been scheduled for an endoscopy. Please follow written instructions given to you at your visit today. If you use inhalers (even only as needed), please bring them with you on the day of your procedure.  You have been scheduled for a CT scan of the abdomen and pelvis at [facility], 1st floor Radiology. You are scheduled on 05/27/21  at 4:00pm. You should arrive 15 minutes prior to your appointment time for registration.   The solution may taste better if refrigerated, but do NOT add ice or any other liquid to this solution. Shake well before drinking.   Please follow the written instructions below on the day of your exam:   1) Do not eat anything after 12:00pm (4 hours prior to your test)   2) Drink 1 bottle of contrast @ 2:00pm (2 hours prior to your exam)  Remember to shake well before drinking and do NOT pour over ice.     Drink 1 bottle of contrast @ 3:00pm (1 hour prior to your exam)   You may take any medications as prescribed with a small amount of water, if necessary. If you take any of the following medications: METFORMIN, GLUCOPHAGE, GLUCOVANCE, AVANDAMET, RIOMET, FORTAMET, [place] MET, JANUMET, GLUMETZA or METAGLIP, you MAY be asked to HOLD this medication 48 hours AFTER the exam.   The purpose of you drinking the oral contrast is to aid in the visualization of your intestinal tract. The contrast solution may cause some diarrhea. Depending on your individual set of symptoms, you may also receive an intravenous injection of x-ray contrast/dye. Plan on being at [facility] for 45 minutes or longer, depending on the type of exam you are having performed.   If  you have any questions regarding your exam or if you need to reschedule, you may call [facility] at [phone] between the hours of 8:00 am and 5:00 pm, Monday-Friday.   Due to recent changes in healthcare laws, you may see the results of your imaging and laboratory studies on MyChart before your provider has had a chance to review them.  We understand that in some cases there may be results that are confusing or concerning to you. Not all laboratory results come back in the same time frame and the provider may be waiting for multiple results in order to interpret others.  Please give [place] 48 hours in order for your provider to thoroughly review all the results before contacting the office for clarification of your results.   Thank you for choosing me and [facility].  Dr. Rush Landmark

## 2021-05-20 NOTE — Progress Notes (Signed)
Terry Parks   Primary Care Provider Terry Beard, MD Terry Parks [id number] [phone]  Patient Profile: Terry Parks is a 66 y.o. male with a pmh significant for Afib (on Xarelto), HTN, HLD, OSA, BPH, MDD, Nephrolithiasis, DM, HFrEF, prostate cancer status post prostatectomy, GERD with prior esophagitis, hemorrhoids, prior IDA.  The patient presents to the [facility] for an evaluation and management of problem(s) noted below:  Problem List 1. Abdominal pain, epigastric   2. Periumbilical pain   3. Indigestion   4. Pyrosis   5. History of iron deficiency anemia   6. Recurrent abdominal hernia without obstruction or gangrene, unspecified hernia type      History of Present Illness: Please see prior notes for full details of HPI.  Interval History The patient returns for follow-up, he has not been seen in over a year.  He was actually scheduled for a clinic Parks yesterday but came in today and thankfully we were able to fit him in.  The patient has been off iron.  Over the course the last 4 to 6 months he has been experiencing an epigastric and mid abdominal discomfort.  He describes this as a burning sensation that sometimes is indigestion-like but not always.  It can be exacerbated by foods but there are times in the day that it can just be present.  He relates some discomfort to what he felt after he had fluid collections from a prior prostatectomy for his prostate cancer.  He has tried to continue his omeprazole without good success.  He has tried over-the-counter Pepto-Bismol without good success.  He has tried and weight and acid with some improvement, at times but not always.  He is currently off PPI therapy.  His weight has been increasing.  He denies overt dysphagia.  The only time his bowels are altered are after Pepto-Bismol because they will be dark for a day.  He is moving his bowels on an every other day  basis which is unchanged for him.  GI Review of Systems Positive as above Negative for odynophagia, dysphagia, nausea, vomiting, hematochezia   Review of Systems General: Denies fevers/chills/unintentional weight loss Cardiovascular: Denies chest pain Pulmonary: Denies shortness of breath or dyspnea on exertion Gastroenterological: See HPI Genitourinary: Denies darkened urine or hematuria Hematological: Positive for bruising/bleeding due to anticoagulation Dermatological: Denies jaundice Psychological: Mood is stable though he is hopeful to feel better   Medications Current Outpatient Medications  Medication Sig Dispense Refill   Accu-Chek Softclix Lancets lancets Check blood sugar three times a day as instructed 300 each 3   amiodarone (PACERONE) 200 MG tablet Take 1 tablet (200 mg total) by mouth daily. 60 tablet 6   amLODipine (NORVASC) 5 MG tablet Take 5 mg by mouth daily.     atorvastatin (LIPITOR) 40 MG tablet Take 1 tablet (40 mg total) by mouth daily. 90 tablet 3   Blood Glucose Monitoring Suppl (ACCU-CHEK GUIDE) w/Device KIT 1 Units by Does not apply route 3 (three) times daily. 1 kit 0   carvedilol (COREG) 25 MG tablet Take 1 tablet (25 mg total) by mouth 2 (two) times daily with a meal. 60 tablet 6   DULoxetine (CYMBALTA) 60 MG capsule Take 1 capsule (60 mg total) by mouth daily. 90 capsule 3   empagliflozin (JARDIANCE) 25 MG TABS tablet TAKE 1 TABLET (25 MG TOTAL) BY MOUTH DAILY BEFORE BREAKFAST. 90 tablet 3   esomeprazole (NEXIUM) 40 MG capsule Take 1  capsule (40 mg total) by mouth daily. 90 capsule 1   ezetimibe (ZETIA) 10 MG tablet Take 1 tablet (10 mg total) by mouth daily. 90 tablet 3   glucose blood (ACCU-CHEK GUIDE) test strip Use as instructed to check blood sugar 3 times daily 300 each 11   Insulin Degludec-Liraglutide (XULTOPHY) 100-3.6 UNIT-MG/ML SOPN Inject 52 Units into the skin daily. 165 mL 0   rivaroxaban (XARELTO) 20 MG TABS tablet Take 1 tablet (20 mg  total) by mouth daily with breakfast. 90 tablet 3   sacubitril-valsartan (ENTRESTO) 97-103 MG Take 1 tablet by mouth 2 (two) times daily. 60 tablet 6   spironolactone (ALDACTONE) 50 MG tablet Take 1 tablet (50 mg total) by mouth daily. 30 tablet 6   sucralfate (CARAFATE) 1 GM/10ML suspension Take 10 mLs (1 g total) by mouth 4 (four) times daily. 420 mL 1   torsemide (DEMADEX) 20 MG tablet TAKE TWO TABLETS BY MOUTH EVERY MORNING 60 tablet 5   TRUEPLUS 5-BEVEL PEN NEEDLES 31G X 6 MM MISC USE AS DIRECTED AT BEDTIME. 100 each 0   No current facility-administered medications for this Parks.    Allergies Allergies  Allergen Reactions   Lisinopril Cough    Histories Past Medical History:  Diagnosis Date   Abscessed tooth    top back large cavity no pain or drainage, one on bottom  pt pulled tooth 4-5 months ago, right top large hole in tooth   AKI (acute kidney injury) ([place])    Allergic rhinitis    Anemia    Anxiety    Asthma    Atrial fibrillation (HCC)    BPH (benign prostatic hypertrophy)    Massive BPH noted on cystoscopy 1/23/ 2012 by Dr. Risa Grill.   Cancer [facility])    prostate cancer 2019   Cardiomyopathy [facility])    CHF (congestive heart failure) ([place])    Cough 03/30/2012   Depression    Diabetes mellitus 04/08/2008   type 2   Dyspnea    Dysrhythmia 2019   Foley catheter in place 07-05-17 placed   Fracture, orbital ([place]) 2021   Right   GERD (gastroesophageal reflux disease) 2020   Headache(784.0)    hx migraines none recent   History of esophagitis 12/16/2019   Hyperlipemia    Hypertension    Hypertensive cardiopathy 03/01/2006   2-D echocardiogram 02/01/2012 showed moderate LVH, mildly to moderately reduced left ventricular systolic function with an estimated ejection fraction of 40-45%, and diffuse hypokinesis.  A nuclear medicine stress study done 01/31/2012 showed no reversible ischemia, a small mid anterior wall fixed defect/infarct, and ejection fraction 42%.       Neck  pain    Nephrolithiasis 05/29/2010   CT scan of abdomen/pelvis on 05/29/2010 showed an obstructing approximate 1-2 mm calculus at the left UVJ, and an approximate 1-2 mm left lower pole renal calculus.   Patient had continuing severe pain , and an elevation of his serum creatinine to a value of 1.75 on 06/06/2010.  Patient underwent cystoscopy on 06/08/2010 by Dr. Risa Grill, but attempts at retrograde pyelogram and ureteroscopy were unsucc   Numbness 01/08/2018   Obstructive sleep apnea 03/06/2008   Sleep study 03/06/08 showed severe OSA/hypopnea syndrome, with successful CPAP titration to 13 CWP using a medium ResMed Mirage Quattro full face mask with heated humidifier.    Rash 04/17/2014   Renal calculus 05/29/2010   CT scan of abdomen/pelvis on 05/29/2010 showed an obstructing approximate 1-2 mm calculus at the left UVJ, and an  approximate 1-2 mm left lower pole renal calculus.   Patient had continuing severe pain , and an elevation of his serum creatinine to a value of 1.75 on 06/06/2010.  The stone had apparently passed and was not seen on repeat CT 06/08/2010.   Sleep apnea    haven't use cpap in 2 years   Tooth pain 10/29/2017   Urinary straining 11/02/2016   Past Surgical History:  Procedure Laterality Date   big toe nails removed Bilateral 20 yrs ago   CARDIOVERSION N/A 06/08/2017   Procedure: CARDIOVERSION;  Surgeon: Josue Hector, MD;  Location: [place];  Service: Cardiovascular;  Laterality: N/A;   COLONOSCOPY     CYSTOSCOPY W/ RETROGRADES     FRACTURE SURGERY Right 2021   5th right phalange   INCISIONAL HERNIA REPAIR N/A 03/24/2020   Procedure: LAPAROSCOPIC INCISIONAL HERNIA REPAR WITH MESH;  Surgeon: Georganna Skeans, MD;  Location: [place];  Service: General;  Laterality: N/A;   IR RADIOLOGIST EVAL & MGMT  02/02/2018   LYMPHADENECTOMY Bilateral 10/06/2017   Procedure: LYMPHADENECTOMY;  Surgeon: Lucas Mallow, MD;  Location: [facility];  Service: Urology;  Laterality: Bilateral;   ROBOT  ASSISTED LAPAROSCOPIC RADICAL PROSTATECTOMY N/A 10/06/2017   Procedure: XI ROBOTIC ASSISTED LAPAROSCOPIC RADICAL PROSTATECTOMY;  Surgeon: Lucas Mallow, MD;  Location: [facility];  Service: Urology;  Laterality: N/A;   TRANSURETHRAL RESECTION OF BLADDER TUMOR N/A 07/13/2017   Procedure: TRANSURETHRAL RESECTION OF PROSTATE;  Surgeon: Lucas Mallow, MD;  Location: [facility];  Service: Urology;  Laterality: N/A;   Social History   Socioeconomic History   Marital status: Single    Spouse name: Not on file   Number of children: 4   Years of education: 16   Highest education level: Not on file  Occupational History   Occupation:        Employer: UNEMPLOYED  Tobacco Use   Smoking status: Never   Smokeless tobacco: Never  Vaping Use   Vaping Use: Never used  Substance and Sexual Activity   Alcohol use: No    Alcohol/week: 0.0 standard drinks   Drug use: No   Sexual activity: Not Currently  Other Topics Concern   Not on file  Social History Narrative   Divorced, 4 children, lives alone.     Social Determinants of Health   Financial Resource Strain: Low Risk    Difficulty of Paying Living Expenses: Not hard at all  Food Insecurity: No Food Insecurity   Worried About Charity fundraiser in the Last Year: Never true   Arboriculturist in the Last Year: Never true  Transportation Needs: Unmet Transportation Needs   Lack of Transportation (Medical): Yes   Lack of Transportation (Non-Medical): No  Physical Activity: Inactive   Days of Exercise per Week: 0 days   Minutes of Exercise per Session: 0 min  Stress: No Stress Concern Present   Feeling of Stress : Not at all  Social Connections: Unknown   Frequency of Communication with Friends and Family: More than three times a week   Frequency of Social Gatherings with Friends and Family: More than three times a week   Attends Religious Services: More than 4 times per year   Active Member of [employer] or Organizations: No   Attends Theatre manager Meetings: Never   Marital Status: Not on file  Intimate Partner Violence: Not At Risk   Fear of Current or Ex-Partner: No   Emotionally  Abused: No   Physically Abused: No   Sexually Abused: No   Family History  Problem Relation Age of Onset   Diabetes Mother    Ulcerative colitis Mother    Breast cancer Mother    Hypertension Father    Stroke Father    Diabetes Maternal Grandmother    Colon cancer Neg Hx    Prostate cancer Neg Hx    Heart attack Neg Hx    Esophageal cancer Neg Hx    Inflammatory bowel disease Neg Hx    Liver disease Neg Hx    Pancreatic cancer Neg Hx    Rectal cancer Neg Hx    Stomach cancer Neg Hx    I have reviewed his medical, social, and family history in detail and updated the electronic medical record as necessary.    PHYSICAL EXAMINATION  BP 96/70    Pulse 77    Ht '6\' 1"'  (1.854 m)    Wt (!) 305 lb 2 oz (138.4 kg)    SpO2 100%    BMI 40.26 kg/m  GEN: NAD, appears stated age, doesn't appear chronically ill PSYCH: Cooperative, without pressured speech EYE: Conjunctivae pink, sclerae anicteric ENT: MMM CV: Nontachycardic RESP: No audible wheezing GI: NABS, soft, obese, rounded, ventral diastases present, nontender today, unable to appreciate hepatosplenomegaly due to body habitus MSK/EXT: Trace bilateral lower extremity edema SKIN: No jaundice NEURO:  Alert & Oriented x 3, no focal deficits   REVIEW OF DATA  I reviewed the following data at the time of this encounter:  GI Procedures and Studies  No new studies to review  Laboratory Studies  Reviewed in epic  Imaging Studies  No new imaging studies to review   ASSESSMENT  Mr. Heyer is a 66 y.o. male with a pmh significant for Afib (on Xarelto), HTN, HLD, OSA, BPH, MDD, Nephrolithiasis, DM, HFrEF, prostate cancer status post prostatectomy, GERD with prior esophagitis, hemorrhoids, prior IDA.  The patient is seen today for evaluation and management of:  1. Abdominal pain,  epigastric   2. Periumbilical pain   3. Indigestion   4. Pyrosis   5. History of iron deficiency anemia   6. Recurrent abdominal hernia without obstruction or gangrene, unspecified hernia type    The patient is hemodynamically stable.  Clinically, he has been experiencing an abdominal discomfort that is outside of his ordinary issues.  His pyrosis symptoms had been controlled with omeprazole but he is having a new burning and indigestion-like discomfort that has occurred at times.  Etiology of this is not completely defined.  We will transition his PPI and we will also start Carafate to see how he is doing.  Due to the patient having some discomfort sensation similar to after he had his prostatectomy with fluid collections, I do think cross-sectional imaging is important.  We will plan to proceed with a CT abdomen/pelvis.  After that an upper endoscopy will be recommended.  We will see how his labs look as well.  If he has any evidence of iron deficiency since he has been off oral iron we may need to restart that.  Only if the patient were to have a persistent iron deficiency thereafter with video capsule endoscopy be helpful.  He should restart using his abdominal binder as well.  I am not sure that surgical referral is required at this time but we certainly can consider that in the future.  Additional work-up to be considered after this has been completed.  The risks and  benefits of endoscopic evaluation were discussed with the patient; these include but are not limited to the risk of perforation, infection, bleeding, missed lesions, lack of diagnosis, severe illness requiring hospitalization, as well as anesthesia and sedation related illnesses.  The patient and/or family is agreeable to proceed.  All patient questions were answered to the best of my ability, and the patient agrees to the aforementioned plan of action with follow-up as indicated.   PLAN  Laboratories as outlined below Proceed with  scheduling CT abdomen pelvis Transition omeprazole to Nexium Initiate Carafate 2-4 times daily Continue abdominal binder use Pending iron indices consider restarting oral iron Repeat endoscopy to be scheduled after CT scan completed -Will obtain approval for Xarelto hold x2 days from cardiology   Orders Placed This Encounter  Procedures   CT Abdomen Pelvis W Contrast   CBC   Comp Met (CMET)   Lipase   IBC + Ferritin   Ambulatory referral to Gastroenterology    New Prescriptions   ESOMEPRAZOLE (NEXIUM) 40 MG CAPSULE    Take 1 capsule (40 mg total) by mouth daily.   SUCRALFATE (CARAFATE) 1 GM/10ML SUSPENSION    Take 10 mLs (1 g total) by mouth 4 (four) times daily.   Modified Medications   No medications on file    Planned Follow Up: No follow-ups on file.  Total Time in Face-to-Face and in Coordination of Care for patient including independent/personal interpretation/review of prior testing, medical history, examination, medication adjustment, communicating results with the patient directly, and documentation with the EHR is 25 minutes.   [name], MD [facility] Office # [phone]

## 2021-05-20 NOTE — Telephone Encounter (Signed)
Request for surgical clearance:     Endoscopy Procedure  What type of surgery is being performed?     EGD   When is this surgery scheduled?     06/09/2021  What type of clearance is required ?   Pharmacy  Are there any medications that need to be held prior to surgery and how long? Xarelto  Practice name and name of physician performing surgery?      [facility]-Dr Mansouraty   What is your office phone and fax number?      Phone- [phone]  [phone]  Anesthesia type (None, local, MAC, general) ?       MAC

## 2021-05-20 NOTE — Telephone Encounter (Signed)
Clinical pharmacist to review Xarelto 

## 2021-05-21 NOTE — Telephone Encounter (Signed)
° °  Name: Terry Parks  DOB: 06-19-1955  MRN: [id number]   Primary Cardiologist: [name], MD  Chart reviewed as part of pre-operative protocol coverage. Patient was contacted 05/21/2021 in reference to pre-operative risk assessment for pending surgery as outlined below.  Terry Parks was last seen 03/2021 by Dr. Sallyanne Kuster. History reviewed. I reached out to patient for update on how he is doing. The patient affirms he has been doing well without any new cardiac symptoms. Therefore, based on ACC/AHA guidelines, the patient would be at acceptable risk for the planned procedure without further cardiovascular testing. The patient was advised that if he develops new symptoms prior to surgery to contact our office to arrange for a follow-up visit, and he verbalized understanding.  Regarding anticoagulation, per pharmD recommendation, per office protocol, patient can hold Xarelto for 2 days prior to procedure.    I will route this recommendation to the requesting party via Epic fax function and remove from pre-op pool. Please call with questions.  Charlie Pitter, PA-C 05/21/2021, 5:08 PM

## 2021-05-21 NOTE — Telephone Encounter (Signed)
Patient with diagnosis of a fib on Xarelto for anticoagulation.    Procedure:  EGD    Date of procedure: 06/09/21   CHA2DS2-VASc Score = 3  This indicates a 3.2% annual risk of stroke. The patient's score is based upon: CHF History: 1 HTN History: 1 Diabetes History: 1 Stroke History: 0 Vascular Disease History: 0 Age Score: 0 Gender Score: 0    CrCl 65 mL/min using adjusted body weight Platelet count 185K   Per office protocol, patient can hold Xarelto for 2 days prior to procedure.

## 2021-05-22 ENCOUNTER — Other Ambulatory Visit: Payer: Self-pay

## 2021-05-22 ENCOUNTER — Other Ambulatory Visit (HOSPITAL_COMMUNITY): Payer: Self-pay

## 2021-05-22 DIAGNOSIS — R12 Heartburn: Secondary | ICD-10-CM | POA: Insufficient documentation

## 2021-05-22 DIAGNOSIS — R1013 Epigastric pain: Secondary | ICD-10-CM

## 2021-05-22 DIAGNOSIS — K458 Other specified abdominal hernia without obstruction or gangrene: Secondary | ICD-10-CM | POA: Insufficient documentation

## 2021-05-22 DIAGNOSIS — R1033 Periumbilical pain: Secondary | ICD-10-CM | POA: Insufficient documentation

## 2021-05-22 DIAGNOSIS — K30 Functional dyspepsia: Secondary | ICD-10-CM | POA: Insufficient documentation

## 2021-05-22 MED ORDER — FERROUS GLUCONATE 324 (38 FE) MG PO TABS
324.0000 mg | ORAL_TABLET | Freq: Every day | ORAL | 3 refills | Status: DC
  Filled 2021-05-22 – 2021-06-11 (×2): qty 30, 30d supply, fill #0

## 2021-05-25 ENCOUNTER — Other Ambulatory Visit: Payer: Self-pay

## 2021-05-25 ENCOUNTER — Other Ambulatory Visit: Payer: Self-pay | Admitting: Obstetrics and Gynecology

## 2021-05-25 NOTE — Patient Outreach (Signed)
Care Coordination  05/25/2021  Lannie Yusuf [birth year] [id number]   Medicaid Managed Care   Unsuccessful Outreach Note  05/25/2021 Name: Caius Silbernagel MRN: [id number] DOB: 06/01/1955  Referred by: [name], MD Reason for referral : High Risk Managed Medicaid (Unsuccessful telephone outreach)   An unsuccessful telephone outreach was attempted today. The patient was referred to the case management team for assistance with care management and care coordination.   Follow Up Plan: The care management team will reach out to the patient again over the next 7-14 days.   [name] RN, BSN    [name] - Managed Medicaid High Risk [phone].

## 2021-05-25 NOTE — Patient Instructions (Signed)
Hi Mr. Murtha, I am sorry I missed you today, I hope you are doing well  - as a part of your Medicaid benefit, you are eligible for care management and care coordination services at no cost or copay. I was unable to reach you by phone today but would be happy to help you with your health related needs. Please feel free to call me at [phone].   A member of the Managed Medicaid care management team will reach out to you again over the next 7-14 days.   [name] RN, BSN [place]   [name] - Managed Medicaid High Risk [phone].

## 2021-05-27 ENCOUNTER — Telehealth: Payer: Self-pay | Admitting: Gastroenterology

## 2021-05-27 ENCOUNTER — Other Ambulatory Visit: Payer: Self-pay

## 2021-05-27 ENCOUNTER — Ambulatory Visit (HOSPITAL_COMMUNITY)
Admission: RE | Admit: 2021-05-27 | Discharge: 2021-05-27 | Disposition: A | Discharge status: Home / Self Care | Payer: Medicaid Other | Source: Ambulatory Visit | Attending: Gastroenterology | Admitting: Gastroenterology

## 2021-05-27 DIAGNOSIS — Z862 Personal history of diseases of the blood and blood-forming organs and certain disorders involving the immune mechanism: Secondary | ICD-10-CM | POA: Diagnosis present

## 2021-05-27 DIAGNOSIS — R1013 Epigastric pain: Secondary | ICD-10-CM

## 2021-05-27 DIAGNOSIS — R109 Unspecified abdominal pain: Secondary | ICD-10-CM | POA: Diagnosis not present

## 2021-05-27 DIAGNOSIS — R1110 Vomiting, unspecified: Secondary | ICD-10-CM | POA: Diagnosis not present

## 2021-05-27 DIAGNOSIS — R12 Heartburn: Secondary | ICD-10-CM | POA: Diagnosis present

## 2021-05-27 DIAGNOSIS — K458 Other specified abdominal hernia without obstruction or gangrene: Secondary | ICD-10-CM | POA: Diagnosis present

## 2021-05-27 DIAGNOSIS — K30 Functional dyspepsia: Secondary | ICD-10-CM

## 2021-05-27 DIAGNOSIS — I700 Atherosclerosis of aorta: Secondary | ICD-10-CM | POA: Diagnosis not present

## 2021-05-27 MED ORDER — IOHEXOL 350 MG/ML SOLN
80.0000 mL | Freq: Once | INTRAVENOUS | Status: AC | PRN
  Administered 2021-05-27: 80 mL via INTRAVENOUS

## 2021-05-27 NOTE — Telephone Encounter (Signed)
Patient called stated he threw up blood last night seeking advise.

## 2021-05-27 NOTE — Telephone Encounter (Signed)
I called and spoke with the pt and gave him the recommendations per Dr Lorenso Courier.  The pt declines to go to the ED.  He says he feels fine now and will just keep his CT and EGD as planned.  I have advised that he stop his xarelto for now and take Nexium twice daily.  I offered to send in zofran for him but he also declined.  I did advise that if he has any further episodes of vomiting blood he should go to the ED for evaluation.  He agreed and verbalized understanding.

## 2021-05-27 NOTE — Telephone Encounter (Signed)
The pt is calling to report a change since his last office visit in 1/4 with Dr Rush Landmark.  He was seen for; Abdominal pain, epigastric    Periumbilical pain    Indigestion    Pyrosis    History of iron deficiency anemia    Recurrent abdominal hernia without obstruction or gangrene, unspecified hernia type    He states he was asked if he had seen any blood when vomiting and at that time he had not.  However, last night he became nauseous and bloated after eating chips and chicken and vomited some dark blood.  He denies any other complaints and states he felt much better after vomiting and has no complaints today.  No blood in the stool, nausea or bloating today.  He is scheduled for a CT scan today and EGD next week.  He is currently on xarelto.  Dr Rush Landmark is out of the office.  Dr Lorenso Courier can you please review as DOD today?

## 2021-05-28 ENCOUNTER — Other Ambulatory Visit (HOSPITAL_COMMUNITY): Payer: Self-pay

## 2021-05-28 NOTE — Telephone Encounter (Signed)
Patient has been informed. Okay to hold Xarelto for 2 days prior to procedure. Pt voiced understanding.

## 2021-05-29 ENCOUNTER — Other Ambulatory Visit: Payer: Self-pay | Admitting: Obstetrics and Gynecology

## 2021-05-29 ENCOUNTER — Other Ambulatory Visit: Payer: Self-pay

## 2021-05-29 NOTE — Patient Outreach (Signed)
Medicaid Managed Care   Nurse Care Manager Note  05/29/2021 Name:  Terry Parks MRN:  [id number] DOB:  06-18-1955  Terry Parks is an 66 y.o. year old male who is a primary patient of Terry Beard, MD.  The [facility] Managed Care Coordination team was consulted for assistance with:    Chronic healthcare management needs.  [name] was given information about Medicaid Managed Care Coordination team services today. Terry Parks Patient agreed to services and verbal consent obtained.  Engaged with patient by telephone for follow up visit in response to provider referral for case management and/or care coordination services.   Assessments/Interventions:  Review of past medical history, allergies, medications, health status, including review of consultants reports, laboratory and other test data, was performed as part of comprehensive evaluation and provision of chronic care management services.  SDOH (Social Determinants of Health) assessments and interventions performed: SDOH Interventions    Flowsheet Row Most Recent Value  SDOH Interventions   Food Insecurity Interventions Intervention Not Indicated  Social Connections Interventions Intervention Not Indicated       Care Plan  Allergies  Allergen Reactions   Lisinopril Cough    Medications Reviewed Today     Reviewed by [name], RN (Registered Nurse) on 05/29/21 at 1433  Med List Status: <None>   Medication Order Taking? Sig Documenting Provider Last Dose Status Informant  Accu-Chek Softclix Lancets lancets [id number] No Check blood sugar three times a day as instructed Terry Lodge, MD Taking Active   amiodarone (PACERONE) 200 MG tablet [id number] No Take 1 tablet (200 mg total) by mouth daily. Parks, [name], MD Taking Active   amLODipine (NORVASC) 5 MG tablet [id number] No Take 5 mg by mouth daily. [provider] Taking Active   atorvastatin (LIPITOR) 40 MG tablet [id number] No Take 1 tablet  (40 mg total) by mouth daily. Terry Lodge, MD Taking Expired 05/20/21 2359   Blood Glucose Monitoring Suppl (ACCU-CHEK GUIDE) w/Device KIT [id number] No 1 Units by Does not apply route 3 (three) times daily. Terry Beard, MD Taking Active   carvedilol (COREG) 25 MG tablet [id number] No Take 1 tablet (25 mg total) by mouth 2 (two) times daily with a meal. Parks, [name], MD Taking Active   DULoxetine (CYMBALTA) 60 MG capsule [id number] No Take 1 capsule (60 mg total) by mouth daily. Terry Lodge, MD Taking Active   empagliflozin (JARDIANCE) 25 MG TABS tablet [id number] No TAKE 1 TABLET (25 MG TOTAL) BY MOUTH DAILY BEFORE BREAKFAST. Terry Hansen, MD Taking Active   esomeprazole (NEXIUM) 40 MG capsule [id number]  Take 1 capsule (40 mg total) by mouth daily. Mansouraty, Terry Nab., MD  Active   ezetimibe (ZETIA) 10 MG tablet [id number] No Take 1 tablet (10 mg total) by mouth daily. Parks, [name], MD Taking Active   ferrous gluconate (FERGON) 324 MG tablet [id number]  Take 1 tablet (324 mg total) by mouth daily with breakfast. Mansouraty, Terry Nab., MD  Active   glucose blood (ACCU-CHEK GUIDE) test strip [id number] No Use as instructed to check blood sugar 3 times daily Terry Beard, MD Taking Active   Insulin Degludec-Liraglutide (XULTOPHY) 100-3.6 UNIT-MG/ML SOPN [id number] No Inject 52 Units into the skin daily. Terry Hansen, MD Taking Active   rivaroxaban (XARELTO) 20 MG TABS tablet [id number] No Take 1 tablet (20 mg total) by mouth daily with breakfast. Parks, [name], MD Taking Active   sacubitril-valsartan (ENTRESTO) 97-103 MG [id number] No Take 1 tablet by mouth 2 (two) times daily. Parks, Terry Gobble, MD Taking  Active   spironolactone (ALDACTONE) 50 MG tablet [id number] No Take 1 tablet (50 mg total) by mouth daily. Parks, [name], MD Taking Active   sucralfate (CARAFATE) 1 GM/10ML suspension [id number]  Take 10 mLs (1 g total) by mouth 4 (four) times daily. Mansouraty, Terry Nab., MD   Active   torsemide (DEMADEX) 20 MG tablet [id number] No TAKE TWO TABLETS BY MOUTH EVERY MORNING Parks, [name], MD Taking Active   TRUEPLUS 5-BEVEL PEN NEEDLES 31G X 6 MM MISC [id number] No USE AS DIRECTED AT BEDTIME. Terry Pope, MD Taking Active             Patient Active Problem List   Diagnosis Date Noted   Recurrent abdominal hernia without obstruction or gangrene 05/22/2021   Pyrosis 05/22/2021   Indigestion 11/07/7626   Periumbilical pain 31/51/7616   Abdominal pain, epigastric 05/22/2021   Class 3 obesity ([place]) 11/10/2020   S/P TURP 11/10/2020   History of iron deficiency anemia 12/16/2019   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   Depression 01/31/2019   Iron deficiency anemia 04/28/2018   CKD stage 3 due to type 2 diabetes mellitus ([place]) 02/11/2018   Healthcare maintenance 02/11/2018   Persistent atrial fibrillation    Vitamin D deficiency 06/22/2016   Allergic rhinitis 10/19/2012   Diabetic retinopathy ([place]) 10/04/2012   Type 2 diabetes mellitus with peripheral neuropathy ([place]) 04/08/2008   Obstructive sleep apnea    Morbid obesity ([place])    Hyperlipidemia associated with type 2 diabetes mellitus ([place])    Hypertension associated with diabetes ([place])    Chronic combined systolic and diastolic CHF (congestive heart failure) ([place]) 03/01/2006    Conditions to be addressed/monitored per PCP order:   chronic healthcare management needs, HTN, DM, CHF, HLD, OSA, CKD, chronic pain, vision loss right eye  Care Plan : CCM RN- Diabetes Type 2 (Adult)  Updates made by [name], RN since 05/29/2021 12:00 AM     Problem: Glycemic Management (Diabetes, Type 2)   Priority: High  Onset Date: 06/03/2020     Long-Range Goal: Glycemic Management Optimized   Start Date: 07/20/2019  Expected End Date: 07/23/2021  Recent Progress: Not on track  Priority: High  Note:    CARE PLAN ENTRY (see longitudinal plan of care for additional care plan information)  Objective:    Lab Results  Component Value Date   HGBA1C 11.1 (A) 07/18/2020   HGBA1C 9.0 (H) 03/19/2020   HGBA1C 10.1 (A) 12/12/2019   Lab Results  Component Value Date   MICROALBUR 11.3 (H) 04/17/2014   LDLCALC 174 (H) 12/12/2019   CREATININE 1.85 (H) 07/18/2020     Current Barriers:  Knowledge Deficits related to basic Diabetes pathophysiology and self care/management Knowledge Deficits related to medications used for management of diabetes-  05/29/21:  Hgb A1C= 7.4 on 04/15/21.  Continues to check blood sugars daily, 115-168  Case Manager Clinical Goal(s):  Over the next 30-60  days, patient will demonstrate improved adherence to prescribed treatment plan for diabetes self care/management as evidenced by:  daily monitoring and recording of CBG  adherence to ADA/ carb modified diet adherence to prescribed medication regimen  Interventions:  Reviewed education with patient about basic DM disease process Reviewed medications and assessed medication taking behavior Ensured patient has ample supply of all prescribed medications Advised patient, providing education and rationale, to check CBG at least daily and record, calling provider and/or CCM RN for findings outside established parameters. Educated patient that leg and foot pain will likely  decrease if blood sugar control is improved and a CGM could provide that assistance  Discussed plans with patient for ongoing care management follow up and provided patient with direct contact information for care management team Review of patient status, including review of consultants reports, relevant laboratory and other test results, and medications completed.  Patient Self Care Activities:  UNABLE to independently self manage DM Self administers oral medications as prescribed Self administers insulin as prescribed Self administers injectable DM medication Xultophy as prescribed Attends all scheduled provider appointments Checks blood sugars as  prescribed and utilize hyper and hypoglycemia protocol as needed Adheres to prescribed ADA/carb modified check blood sugar at prescribed times - check blood sugar if I feel it is too high or too low - enter blood sugar readings and medication or insulin into my phone log - take the blood sugar log to all doctor visits - take the blood sugar meter to all doctor visits     Care Plan : CCM RN- Chronic Pain (Adult)- lower extremeties  Updates made by [name], RN since 05/29/2021 12:00 AM     Problem: Chronic Pain Management (Chronic Pain)   Priority: High  Onset Date: 07/15/2020     Long-Range Goal: Chronic Pain Managed   Start Date: 07/15/2020  Expected End Date: 07/15/2021  Recent Progress: On track  Priority: High  Note:   Current Barriers:  Knowledge Deficits related to self-health management of chronic lower extremity pain Chronic Disease Management support and education needs related to chronic pain 05/29/21:  Patient complaining of ongoing back and foot pain-following up with provider-denies any abdominal pain Clinical Goal(s):  patient will verbalize understanding of plan for pain management.  and patient will use pharmacological and nonpharmacological pain relief strategies as prescribed.  Interventions:  Pain assessment performed Medications reviewed Discussed plans with patient for ongoing care management follow up and provided patient with direct contact information for care management team Discussed improvement  in DM control will likely result in less pain in legs and feet  Patient Goals/Self Care Activities:  Will self-administer medications as prescribed Will attend all scheduled provider appointments Will call pharmacy for medication refills 7 days prior to needed refill date Patient will calls provider office for new concerns or questions Follow Up Plan: The care management team will reach out to the patient again over the next 30-60 days.    Care Plan : General  Plan of Care (Adult)  Updates made by [name], RN since 05/29/2021 12:00 AM     Problem: Health Promotion or Disease Self-Management (General Plan of Care)   Priority: Medium  Onset Date: 10/07/2020     Long-Range Goal: Self-Management Plan Developed   Start Date: 10/07/2020  Expected End Date: 08/27/2021  This Visit's Progress: On track  Recent Progress: Not on track  Priority: Medium  Note:   Current Barriers:  Ineffective Self Health Maintenance Patient needs aide and eyeglasses Currently UNABLE TO independently self manage needs related to chronic health conditions.  Knowledge Deficits related to short term plan for care coordination needs and long term plans for chronic disease management needs 05/29/21:  patient would like aide service to be started again, concerned about possible hernia Nurse Case Manager Clinical Goal(s):  patient will work with care management team to address care coordination and chronic disease management needs related to Disease Management Educational Needs Care Coordination Medication Management and Education Medication Reconciliation Medication Assistance  Psychosocial Support   Interventions:  Evaluation of current treatment  plan and patient's adherence to plan as established by provider. Reviewed medications with patient. Collaborated with pharmacy regarding referrals. Collaborated with PCP for referrals. Collaborated with Care Guide for eyeglass provider that accepts patient's insurance Care Guide referral for eyeglass provider Discussed plans with patient for ongoing care management follow up and provided patient with direct contact information for care management team Advised patient, providing education and rationale, to monitor blood pressure daily and record, calling provider for findings outside established parameters.  Reviewed scheduled/upcoming provider appointments. 05/29/21:  patient to follow up with provider regarding possible  hernia. Advised patient, providing education and rationale, to check cbg and record, calling provider for findings outside established parameters.   Pharmacy referral for medication review. Advised patient to contact insurance company regarding resumption of aide service. Self Care Activities:  Patient will self administer medications as prescribed Patient will attend all scheduled provider appointments Patient will call pharmacy for medication refills Patient will continue to perform ADL's independently Patient will call provider office for new concerns or questions Patient Goals: In the next 30 days, patient will meet with Pharmacist for medication review. In the next 30 days, patient will call [facility] for aide. In the next 30 days, patient will attend all appointments. - Follow Up Plan: The patient has been provided with contact information for the care management team and has been advised to call with any health related questions or concerns.  The care management team will reach out to the patient again over the next 30 days.    Evidence-based guidance:   Review biopsychosocial determinants of health screens.  Review need for preventive screening based on age, sex, family history and health history.  Determine level of modifiable health risk.  Discuss identified risks.  Identify areas where behavior change may lead to improved health.  Promote healthy lifestyle.  Evoke change talk using open-ended questions, pros and cons, as well as looking forward.  Identify and manage conditions or preconditions to reduce health risk.  Implement additional goals and interventions based on identified risk factors.     Follow Up:  Patient agrees to Care Plan and Follow-up.  Plan: The Managed Medicaid care management team will reach out to the patient again over the next 30 days. and The  Patient has been provided with contact information for the Managed Medicaid care  management team and has been advised to call with any health related questions or concerns.  Date/time of next scheduled RN care management/care coordination outreach:  06/29/21 at 1030.

## 2021-05-29 NOTE — Patient Instructions (Addendum)
Hi Terry Parks, thank you for speaking to me today-have a great afternoon!  Terry Parks was given information about Medicaid Managed Care team care coordination services as a part of their [place] [employer] benefit. Terry Parks verbally consented to engagement with the [facility] Managed Care team.   If you are experiencing a medical emergency, please call 911 or report to your local emergency department or urgent care.   If you have a non-emergency medical problem during routine business hours, please contact your provider's office and ask to speak with a nurse.   For questions related to your [facility], please call: [phone] or visit the homepage here: [email or web address]  If you would like to schedule transportation through your [facility], please call the following number at least 2 days in advance of your appointment: [phone].   Call the [place] at [phone], at any time, 24 hours a day, 7 days a week. If you are in danger or need immediate medical attention call 911.  If you would like help to quit smoking, call 1-800-QUIT-NOW 610-178-0433) OR Espaol: 1-855-Djelo-Ya (2-409-735-3299) o para ms informacin haga clic aqu or Text READY to 200-400 to register via text  Terry Parks - following are the goals we discussed in your visit today:   Goals Addressed             This Visit's Progress    Monitor and Manage My Blood Sugar-Diabetes Type 2       Timeframe:  Long-Range Goal Priority:  High Start Date:        06/03/20                     Expected End Date:   ongoing               Follow Up Date:  06/29/21   - check blood sugar at prescribed times - check blood sugar if I feel it is too high or too low - enter blood sugar readings and medication or insulin into my phone log - take the blood sugar log to all  doctor visits - take the blood sugar meter to all doctor visits    Why is this important?   Checking your blood sugar at home helps to keep it from getting very high or very low.  Writing the results in a diary or log helps the doctor know how to care for you.  Your blood sugar log should have the time, date and the results.  Also, write down the amount of insulin or other medicine that you take.  Other information, like what you ate, exercise done and how you were feeling, will also be helpful.     Notes:-  05/29/21:  Patient continues to check blood sugar daily, 115-168     Protect My Health       Timeframe:  Long-Range Goal Priority:  Medium Start Date:      10/07/20                       Expected End Date:  ongoing                Follow Up Date:  06/29/21   - schedule appointment for flu shot - schedule appointment for vaccines needed due to my age or health - schedule recommended health tests. - schedule and keep appointment for annual check-up  05/29/21, patient currently  following with GI.   Why is this important?   Screening tests can find diseases early when they are easier to treat.  Your doctor or nurse will talk with you about which tests are important for you.  Getting shots for common diseases like the flu and shingles will help prevent them.        Track and Manage Fluids and Swelling-Heart Failure       Timeframe:  Long-Range Goal Priority:  High Start Date:          01/14/20                   Expected End Date:   ongoing     Follow Up Date: 06/29/21   - call office if I gain more than 2 pounds in one day or 5 pounds in one week - do ankle pumps when sitting - use salt in moderation - watch for swelling in feet, ankles and legs every day - weigh myself daily  - log weight in my phone   Why is this important?   It is important to check your weight daily and watch how much salt and liquids you have.  It will help you to manage your heart failure.     Notes: 05/29/21:  c/o feet swelling-no change     Track and Manage My Blood Pressure-Hypertension       Timeframe:  Long-Range Goal Priority:  High Start Date:     06/03/20                        Expected End Date:  ongoing                 Follow Up Date: 06/29/21 - check blood pressure daily - write blood pressure results in my phone log    Why is this important?   You won't feel high blood pressure, but it can still hurt your blood vessels.  High blood pressure can cause heart or kidney problems. It can also cause a stroke.  Making lifestyle changes like losing a little weight or eating less salt will help.  Checking your blood pressure at home and at different times of the day can help to control blood pressure.  If the doctor prescribes medicine remember to take it the way the doctor ordered.  Call the office if you cannot afford the medicine or if there are questions about it.     Notes:  05/29/21:  Blood pressure= 95/83       The patient verbalized understanding of instructions provided today and agreed to receive a mailed copy of patient instruction and/or educational materials.  The Managed Medicaid care management team will reach out to the patient again over the next 30 days.  The  Patient has been provided with contact information for the Managed Medicaid care management team and has been advised to call with any health related questions or concerns.   [name] RN, BSN [place]   [name] - Managed Medicaid High Risk [phone].   Following is a copy of your plan of care:  Care Plan : CCM RN- Diabetes Type 2 (Adult)  Updates made by [name], RN since 05/29/2021 12:00 AM     Problem: Glycemic Management (Diabetes, Type 2)   Priority: High  Onset Date: 06/03/2020     Long-Range Goal: Glycemic Management Optimized   Start Date: 07/20/2019  Expected End  Date: 07/23/2021  Recent Progress: Not on track   Priority: High  Note:    CARE PLAN ENTRY (see longitudinal plan of care for additional care plan information)  Objective:   Lab Results  Component Value Date   HGBA1C 11.1 (A) 07/18/2020   HGBA1C 9.0 (H) 03/19/2020   HGBA1C 10.1 (A) 12/12/2019   Lab Results  Component Value Date   MICROALBUR 11.3 (H) 04/17/2014   LDLCALC 174 (H) 12/12/2019   CREATININE 1.85 (H) 07/18/2020     Current Barriers:  Knowledge Deficits related to basic Diabetes pathophysiology and self care/management Knowledge Deficits related to medications used for management of diabetes-  05/29/21:  Hgb A1C= 7.4 on 04/15/21.  Continues to check blood sugars daily, 115-168  Case Manager Clinical Goal(s):  Over the next 30-60  days, patient will demonstrate improved adherence to prescribed treatment plan for diabetes self care/management as evidenced by:  daily monitoring and recording of CBG  adherence to ADA/ carb modified diet adherence to prescribed medication regimen  Interventions:  Reviewed education with patient about basic DM disease process Reviewed medications and assessed medication taking behavior Ensured patient has ample supply of all prescribed medications Advised patient, providing education and rationale, to check CBG at least daily and record, calling provider and/or CCM RN for findings outside established parameters. Educated patient that leg and foot pain will likely decrease if blood sugar control is improved and a CGM could provide that assistance  Discussed plans with patient for ongoing care management follow up and provided patient with direct contact information for care management team Review of patient status, including review of consultants reports, relevant laboratory and other test results, and medications completed.  Patient Self Care Activities:  UNABLE to independently self manage DM Self administers oral medications as prescribed Self administers insulin as prescribed Self  administers injectable DM medication Xultophy as prescribed Attends all scheduled provider appointments Checks blood sugars as prescribed and utilize hyper and hypoglycemia protocol as needed Adheres to prescribed ADA/carb modified check blood sugar at prescribed times - check blood sugar if I feel it is too high or too low - enter blood sugar readings and medication or insulin into my phone log - take the blood sugar log to all doctor visits - take the blood sugar meter to all doctor visit   Care Plan : CCM RN- Chronic Pain (Adult)- lower extremeties  Updates made by [name], RN since 05/29/2021 12:00 AM     Problem: Chronic Pain Management (Chronic Pain)   Priority: High  Onset Date: 07/15/2020     Long-Range Goal: Chronic Pain Managed   Start Date: 07/15/2020  Expected End Date: 07/15/2021  Recent Progress: On track  Priority: High  Note:   Current Barriers:  Knowledge Deficits related to self-health management of chronic lower extremity pain Chronic Disease Management support and education needs related to chronic pain 05/29/21:  Patient complaining of ongoing back and foot pain-following up with provider-denies any abdominal pain Clinical Goal(s):  patient will verbalize understanding of plan for pain management.  and patient will use pharmacological and nonpharmacological pain relief strategies as prescribed.  Interventions:  Pain assessment performed Medications reviewed Discussed plans with patient for ongoing care management follow up and provided patient with direct contact information for care management team Discussed improvement  in DM control will likely result in less pain in legs and feet  Patient Goals/Self Care Activities:  Will self-administer medications as prescribed Will attend all scheduled provider appointments Will call  pharmacy for medication refills 7 days prior to needed refill date Patient will calls provider office for new concerns or  questions Follow Up Plan: The care management team will reach out to the patient again over the next 30-60 days.      Care Plan : General Plan of Care (Adult)  Updates made by [name], RN since 05/29/2021 12:00 AM     Problem: Health Promotion or Disease Self-Management (General Plan of Care)   Priority: Medium  Onset Date: 10/07/2020     Long-Range Goal: Self-Management Plan Developed   Start Date: 10/07/2020  Expected End Date: 08/27/2021  This Visit's Progress: On track  Recent Progress: Not on track  Priority: Medium  Note:   Current Barriers:  Ineffective Self Health Maintenance Patient needs aide and eyeglasses Currently UNABLE TO independently self manage needs related to chronic health conditions.  Knowledge Deficits related to short term plan for care coordination needs and long term plans for chronic disease management needs 05/29/21:  patient would like aide service to be started again, concerned about possible hernia Nurse Case Manager Clinical Goal(s):  patient will work with care management team to address care coordination and chronic disease management needs related to Disease Management Educational Needs Care Coordination Medication Management and Education Medication Reconciliation Medication Assistance  Psychosocial Support   Interventions:  Evaluation of current treatment plan and patient's adherence to plan as established by provider. Reviewed medications with patient. Collaborated with pharmacy regarding referrals. Collaborated with PCP for referrals. Collaborated with Care Guide for eyeglass provider that accepts patient's insurance Care Guide referral for eyeglass provider Discussed plans with patient for ongoing care management follow up and provided patient with direct contact information for care management team Advised patient, providing education and rationale, to monitor blood pressure daily and record, calling provider for findings outside  established parameters.  Reviewed scheduled/upcoming provider appointments. 05/29/21:  patient to follow up with provider regarding possible hernia. Advised patient, providing education and rationale, to check cbg and record, calling provider for findings outside established parameters.   Pharmacy referral for medication review. Advised patient to contact insurance company regarding resumption of aide service. Self Care Activities:  Patient will self administer medications as prescribed Patient will attend all scheduled provider appointments Patient will call pharmacy for medication refills Patient will continue to perform ADL's independently Patient will call provider office for new concerns or questions Patient Goals: In the next 30 days, patient will meet with Pharmacist for medication review. In the next 30 days, patient will call [facility] for aide. In the next 30 days, patient will attend all appointments. - Follow Up Plan: The patient has been provided with contact information for the care management team and has been advised to call with any health related questions or concerns.  The care management team will reach out to the patient again over the next 30 days.    Evidence-based guidance:   Review biopsychosocial determinants of health screens.  Review need for preventive screening based on age, sex, family history and health history.  Determine level of modifiable health risk.  Discuss identified risks.  Identify areas where behavior change may lead to improved health.  Promote healthy lifestyle.  Evoke change talk using open-ended questions, pros and cons, as well as looking forward.  Identify and manage conditions or preconditions to reduce health risk.  Implement additional goals and interventions based on identified risk factors.

## 2021-06-01 NOTE — Telephone Encounter (Signed)
I spoke with the pt and he tells me that he that he has had no further bleeding.  He feels good at this time. He will restart his blood thinner and then hold for 2 days prior to his upcoming procedure appt

## 2021-06-01 NOTE — Telephone Encounter (Signed)
Patty, I do not want the patient to be off of his blood thinner for too long of a period in time due to his stroke risk. Continue twice daily PPI. If he has not had any further bleeding, then I would let him restart his blood thinner. He can hold it as per our prior clearance note for the 2 days prior to his procedure next week. Thanks. GM

## 2021-06-04 ENCOUNTER — Other Ambulatory Visit: Payer: Self-pay

## 2021-06-04 ENCOUNTER — Other Ambulatory Visit: Payer: Medicaid Other | Admitting: Obstetrics and Gynecology

## 2021-06-04 NOTE — Patient Outreach (Signed)
Care Coordination  06/04/2021  Tye Vigo November 30, 1955 [id number]  RNCM returned patient's phone call regarding handicap placard for car.  Patient to call PCP office for placard-all questions answered at this time and patient in agreement to call PCP's office.  [name] RN, BSN [place]   [name] - Managed Medicaid High Risk [phone].

## 2021-06-09 ENCOUNTER — Other Ambulatory Visit: Payer: Self-pay

## 2021-06-09 ENCOUNTER — Encounter: Payer: Self-pay | Admitting: Gastroenterology

## 2021-06-09 ENCOUNTER — Other Ambulatory Visit (HOSPITAL_COMMUNITY): Payer: Self-pay

## 2021-06-09 ENCOUNTER — Ambulatory Visit (AMBULATORY_SURGERY_CENTER): Payer: Medicaid Other | Admitting: Gastroenterology

## 2021-06-09 VITALS — BP 130/79 | HR 78 | Temp 98.0°F | Resp 12 | Ht 73.0 in | Wt 305.0 lb

## 2021-06-09 DIAGNOSIS — Z8719 Personal history of other diseases of the digestive system: Secondary | ICD-10-CM

## 2021-06-09 DIAGNOSIS — K449 Diaphragmatic hernia without obstruction or gangrene: Secondary | ICD-10-CM

## 2021-06-09 DIAGNOSIS — R1013 Epigastric pain: Secondary | ICD-10-CM | POA: Diagnosis not present

## 2021-06-09 DIAGNOSIS — K920 Hematemesis: Secondary | ICD-10-CM | POA: Diagnosis not present

## 2021-06-09 DIAGNOSIS — K2100 Gastro-esophageal reflux disease with esophagitis, without bleeding: Secondary | ICD-10-CM | POA: Diagnosis not present

## 2021-06-09 DIAGNOSIS — R109 Unspecified abdominal pain: Secondary | ICD-10-CM | POA: Diagnosis not present

## 2021-06-09 DIAGNOSIS — K2970 Gastritis, unspecified, without bleeding: Secondary | ICD-10-CM

## 2021-06-09 DIAGNOSIS — Z862 Personal history of diseases of the blood and blood-forming organs and certain disorders involving the immune mechanism: Secondary | ICD-10-CM

## 2021-06-09 DIAGNOSIS — K2210 Ulcer of esophagus without bleeding: Secondary | ICD-10-CM | POA: Diagnosis not present

## 2021-06-09 MED ORDER — SODIUM CHLORIDE 0.9 % IV SOLN: 500.0000 mL | Freq: Once | INTRAVENOUS | Status: DC

## 2021-06-09 MED ORDER — ESOMEPRAZOLE MAGNESIUM 40 MG PO CPDR
40.0000 mg | DELAYED_RELEASE_CAPSULE | Freq: Two times a day (BID) | ORAL | 1 refills | Status: DC
  Filled 2021-06-09: qty 180, 90d supply, fill #0

## 2021-06-09 NOTE — Progress Notes (Signed)
Pt's states no medical or surgical changes since previsit or office visit.  ° °VS DT °

## 2021-06-09 NOTE — Progress Notes (Signed)
GASTROENTEROLOGY PROCEDURE H&P NOTE   Primary Care Physician: [name], MD  HPI: Terry Parks is a 66 y.o. male who presents for Enteroscopy/EGD to evaluate hematemesis and recurrent Iron Deficiency.  Past Medical History:  Diagnosis Date   Abscessed tooth    top back large cavity no pain or drainage, one on bottom  pt pulled tooth 4-5 months ago, right top large hole in tooth   AKI (acute kidney injury) ([place])    Allergic rhinitis    Anemia    Anxiety    Asthma    Atrial fibrillation (HCC)    BPH (benign prostatic hypertrophy)    Massive BPH noted on cystoscopy 1/23/ 2012 by Dr. Risa Grill.   Cancer [facility])    prostate cancer 2019   Cardiomyopathy [facility]    CHF (congestive heart failure) ([place])    Cough 03/30/2012   Depression    Diabetes mellitus 04/08/2008   type 2   Dyspnea    Dysrhythmia 2019   Foley catheter in place 07-05-17 placed   Fracture, orbital ([place]) 2021   Right   GERD (gastroesophageal reflux disease) 2020   Headache(784.0)    hx migraines none recent   History of esophagitis 12/16/2019   Hyperlipemia    Hypertension    Hypertensive cardiopathy 03/01/2006   2-D echocardiogram 02/01/2012 showed moderate LVH, mildly to moderately reduced left ventricular systolic function with an estimated ejection fraction of 40-45%, and diffuse hypokinesis.  A nuclear medicine stress study done 01/31/2012 showed no reversible ischemia, a small mid anterior wall fixed defect/infarct, and ejection fraction 42%.       Neck pain    Nephrolithiasis 05/29/2010   CT scan of abdomen/pelvis on 05/29/2010 showed an obstructing approximate 1-2 mm calculus at the left UVJ, and an approximate 1-2 mm left lower pole renal calculus.   Patient had continuing severe pain , and an elevation of his serum creatinine to a value of 1.75 on 06/06/2010.  Patient underwent cystoscopy on 06/08/2010 by Dr. Risa Grill, but attempts at retrograde pyelogram and ureteroscopy were unsucc   Numbness 01/08/2018    Obstructive sleep apnea 03/06/2008   Sleep study 03/06/08 showed severe OSA/hypopnea syndrome, with successful CPAP titration to 13 CWP using a medium ResMed Mirage Quattro full face mask with heated humidifier.    Rash 04/17/2014   Renal calculus 05/29/2010   CT scan of abdomen/pelvis on 05/29/2010 showed an obstructing approximate 1-2 mm calculus at the left UVJ, and an approximate 1-2 mm left lower pole renal calculus.   Patient had continuing severe pain , and an elevation of his serum creatinine to a value of 1.75 on 06/06/2010.  The stone had apparently passed and was not seen on repeat CT 06/08/2010.   Sleep apnea    haven't use cpap in 2 years   Tooth pain 10/29/2017   Urinary straining 11/02/2016   Past Surgical History:  Procedure Laterality Date   big toe nails removed Bilateral 20 yrs ago   CARDIOVERSION N/A 06/08/2017   Procedure: CARDIOVERSION;  Surgeon: Josue Hector, MD;  Location: [place];  Service: Cardiovascular;  Laterality: N/A;   COLONOSCOPY     CYSTOSCOPY W/ RETROGRADES     FRACTURE SURGERY Right 2021   5th right phalange   INCISIONAL HERNIA REPAIR N/A 03/24/2020   Procedure: LAPAROSCOPIC INCISIONAL HERNIA REPAR WITH MESH;  Surgeon: Georganna Skeans, MD;  Location: [place];  Service: General;  Laterality: N/A;   IR RADIOLOGIST EVAL & MGMT  02/02/2018   LYMPHADENECTOMY  Bilateral 10/06/2017   Procedure: LYMPHADENECTOMY;  Surgeon: Lucas Mallow, MD;  Location: [facility];  Service: Urology;  Laterality: Bilateral;   ROBOT ASSISTED LAPAROSCOPIC RADICAL PROSTATECTOMY N/A 10/06/2017   Procedure: XI ROBOTIC ASSISTED LAPAROSCOPIC RADICAL PROSTATECTOMY;  Surgeon: Lucas Mallow, MD;  Location: [facility];  Service: Urology;  Laterality: N/A;   TRANSURETHRAL RESECTION OF BLADDER TUMOR N/A 07/13/2017   Procedure: TRANSURETHRAL RESECTION OF PROSTATE;  Surgeon: Lucas Mallow, MD;  Location: [facility];  Service: Urology;  Laterality: N/A;   Current Outpatient Medications  Medication  Sig Dispense Refill   ferrous gluconate (FERGON) 324 MG tablet Take 1 tablet (324 mg total) by mouth daily with breakfast. 30 tablet 3   Accu-Chek Softclix Lancets lancets Check blood sugar three times a day as instructed 300 each 3   amiodarone (PACERONE) 200 MG tablet Take 1 tablet (200 mg total) by mouth daily. 60 tablet 6   amLODipine (NORVASC) 5 MG tablet Take 5 mg by mouth daily.     atorvastatin (LIPITOR) 40 MG tablet Take 1 tablet (40 mg total) by mouth daily. 90 tablet 3   Blood Glucose Monitoring Suppl (ACCU-CHEK GUIDE) w/Device KIT 1 Units by Does not apply route 3 (three) times daily. 1 kit 0   carvedilol (COREG) 25 MG tablet Take 1 tablet (25 mg total) by mouth 2 (two) times daily with a meal. 60 tablet 6   DULoxetine (CYMBALTA) 60 MG capsule Take 1 capsule (60 mg total) by mouth daily. 90 capsule 3   empagliflozin (JARDIANCE) 25 MG TABS tablet TAKE 1 TABLET (25 MG TOTAL) BY MOUTH DAILY BEFORE BREAKFAST. 90 tablet 3   esomeprazole (NEXIUM) 40 MG capsule Take 1 capsule (40 mg total) by mouth daily. 90 capsule 1   ezetimibe (ZETIA) 10 MG tablet Take 1 tablet (10 mg total) by mouth daily. 90 tablet 3   glucose blood (ACCU-CHEK GUIDE) test strip Use as instructed to check blood sugar 3 times daily 300 each 11   Insulin Degludec-Liraglutide (XULTOPHY) 100-3.6 UNIT-MG/ML SOPN Inject 52 Units into the skin daily. 165 mL 0   rivaroxaban (XARELTO) 20 MG TABS tablet Take 1 tablet (20 mg total) by mouth daily with breakfast. 90 tablet 3   sacubitril-valsartan (ENTRESTO) 97-103 MG Take 1 tablet by mouth 2 (two) times daily. 60 tablet 6   spironolactone (ALDACTONE) 50 MG tablet Take 1 tablet (50 mg total) by mouth daily. 30 tablet 6   sucralfate (CARAFATE) 1 GM/10ML suspension Take 10 mLs (1 g total) by mouth 4 (four) times daily. 420 mL 1   torsemide (DEMADEX) 20 MG tablet TAKE TWO TABLETS BY MOUTH EVERY MORNING 60 tablet 5   TRUEPLUS 5-BEVEL PEN NEEDLES 31G X 6 MM MISC USE AS DIRECTED AT  BEDTIME. 100 each 0   Current Facility-Administered Medications  Medication Dose Route Frequency Provider Last Rate Last Admin   0.9 %  sodium chloride infusion  500 mL Intravenous Once Mansouraty, [name], MD        Current Outpatient Medications:    ferrous gluconate (FERGON) 324 MG tablet, Take 1 tablet (324 mg total) by mouth daily with breakfast., Disp: 30 tablet, Rfl: 3   Accu-Chek Softclix Lancets lancets, Check blood sugar three times a day as instructed, Disp: 300 each, Rfl: 3   amiodarone (PACERONE) 200 MG tablet, Take 1 tablet (200 mg total) by mouth daily., Disp: 60 tablet, Rfl: 6   amLODipine (NORVASC) 5 MG tablet, Take 5 mg by mouth  daily., Disp: , Rfl:    atorvastatin (LIPITOR) 40 MG tablet, Take 1 tablet (40 mg total) by mouth daily., Disp: 90 tablet, Rfl: 3   Blood Glucose Monitoring Suppl (ACCU-CHEK GUIDE) w/Device KIT, 1 Units by Does not apply route 3 (three) times daily., Disp: 1 kit, Rfl: 0   carvedilol (COREG) 25 MG tablet, Take 1 tablet (25 mg total) by mouth 2 (two) times daily with a meal., Disp: 60 tablet, Rfl: 6   DULoxetine (CYMBALTA) 60 MG capsule, Take 1 capsule (60 mg total) by mouth daily., Disp: 90 capsule, Rfl: 3   empagliflozin (JARDIANCE) 25 MG TABS tablet, TAKE 1 TABLET (25 MG TOTAL) BY MOUTH DAILY BEFORE BREAKFAST., Disp: 90 tablet, Rfl: 3   esomeprazole (NEXIUM) 40 MG capsule, Take 1 capsule (40 mg total) by mouth daily., Disp: 90 capsule, Rfl: 1   ezetimibe (ZETIA) 10 MG tablet, Take 1 tablet (10 mg total) by mouth daily., Disp: 90 tablet, Rfl: 3   glucose blood (ACCU-CHEK GUIDE) test strip, Use as instructed to check blood sugar 3 times daily, Disp: 300 each, Rfl: 11   Insulin Degludec-Liraglutide (XULTOPHY) 100-3.6 UNIT-MG/ML SOPN, Inject 52 Units into the skin daily., Disp: 165 mL, Rfl: 0   rivaroxaban (XARELTO) 20 MG TABS tablet, Take 1 tablet (20 mg total) by mouth daily with breakfast., Disp: 90 tablet, Rfl: 3   sacubitril-valsartan (ENTRESTO)  97-103 MG, Take 1 tablet by mouth 2 (two) times daily., Disp: 60 tablet, Rfl: 6   spironolactone (ALDACTONE) 50 MG tablet, Take 1 tablet (50 mg total) by mouth daily., Disp: 30 tablet, Rfl: 6   sucralfate (CARAFATE) 1 GM/10ML suspension, Take 10 mLs (1 g total) by mouth 4 (four) times daily., Disp: 420 mL, Rfl: 1   torsemide (DEMADEX) 20 MG tablet, TAKE TWO TABLETS BY MOUTH EVERY MORNING, Disp: 60 tablet, Rfl: 5   TRUEPLUS 5-BEVEL PEN NEEDLES 31G X 6 MM MISC, USE AS DIRECTED AT BEDTIME., Disp: 100 each, Rfl: 0  Current Facility-Administered Medications:    0.9 %  sodium chloride infusion, 500 mL, Intravenous, Once, Mansouraty, [name], MD Allergies  Allergen Reactions   Lisinopril Cough   Family History  Problem Relation Age of Onset   Diabetes Mother    Ulcerative colitis Mother    Breast cancer Mother    Hypertension Father    Stroke Father    Diabetes Maternal Grandmother    Colon cancer Neg Hx    Prostate cancer Neg Hx    Heart attack Neg Hx    Esophageal cancer Neg Hx    Inflammatory bowel disease Neg Hx    Liver disease Neg Hx    Pancreatic cancer Neg Hx    Rectal cancer Neg Hx    Stomach cancer Neg Hx    Social History   Socioeconomic History   Marital status: Single    Spouse name: Not on file   Number of children: 4   Years of education: 16   Highest education level: Not on file  Occupational History   Occupation:        Employer: UNEMPLOYED  Tobacco Use   Smoking status: Never   Smokeless tobacco: Never  Vaping Use   Vaping Use: Never used  Substance and Sexual Activity   Alcohol use: No    Alcohol/week: 0.0 standard drinks   Drug use: No   Sexual activity: Not Currently  Other Topics Concern   Not on file  Social History Narrative   Divorced, 4 children, lives  alone.     Social Determinants of Health   Financial Resource Strain: Low Risk    Difficulty of Paying Living Expenses: Not hard at all  Food Insecurity: No Food Insecurity   Worried  About Charity fundraiser in the Last Year: Never true   Arboriculturist in the Last Year: Never true  Transportation Needs: Unmet Transportation Needs   Lack of Transportation (Medical): Yes   Lack of Transportation (Non-Medical): No  Physical Activity: Inactive   Days of Exercise per Week: 0 days   Minutes of Exercise per Session: 0 min  Stress: No Stress Concern Present   Feeling of Stress : Not at all  Social Connections: Moderately Isolated   Frequency of Communication with Friends and Family: More than three times a week   Frequency of Social Gatherings with Friends and Family: More than three times a week   Attends Religious Services: More than 4 times per year   Active Member of Clubs or Organizations: No   Attends Archivist Meetings: Never   Marital Status: Never married  Human resources officer Violence: Not At Risk   Fear of Current or Ex-Partner: No   Emotionally Abused: No   Physically Abused: No   Sexually Abused: No    Physical Exam: Today's Vitals   06/09/21 0934  BP: (!) 152/85  Pulse: 84  Temp: 98 F (36.7 C)  TempSrc: Temporal  SpO2: 99%  Weight: (!) 305 lb (138.3 kg)  Height: '6\' 1"'  (1.854 m)   Body mass index is 40.24 kg/m. GEN: NAD EYE: Sclerae anicteric ENT: MMM CV: Non-tachycardic GI: Soft, NT/ND NEURO:  Alert & Oriented x 3  Lab Results: No results for input(s): WBC, HGB, HCT, PLT in the last 72 hours. BMET No results for input(s): NA, K, CL, CO2, GLUCOSE, BUN, CREATININE, CALCIUM in the last 72 hours. LFT No results for input(s): PROT, ALBUMIN, AST, ALT, ALKPHOS, BILITOT, BILIDIR, IBILI in the last 72 hours. PT/INR No results for input(s): LABPROT, INR in the last 72 hours.   Impression / Plan: This is a 66 y.o.male who presents for Enteroscopy/EGD to evaluate hematemesis and recurrent Iron Deficiency.  The risks and benefits of endoscopic evaluation/treatment were discussed with the patient and/or family; these include but are  not limited to the risk of perforation, infection, bleeding, missed lesions, lack of diagnosis, severe illness requiring hospitalization, as well as anesthesia and sedation related illnesses.  The patient's history has been reviewed, patient examined, no change in status, and deemed stable for procedure.  The patient and/or family is agreeable to proceed.    [name], MD [facility] Office # [phone]

## 2021-06-09 NOTE — Patient Instructions (Signed)
Handouts given for Gastritis and Hiatal Hernia.  May restart Xarelto on 1/25 AM.  Continue present medications.  Need to restart Nexium 40mg  twice a day.  Resume previous diet.     YOU HAD AN ENDOSCOPIC PROCEDURE TODAY AT [facility]:   Refer to the procedure report that was given to you for any specific questions about what was found during the examination.  If the procedure report does not answer your questions, please call your gastroenterologist to clarify.  If you requested that your care partner not be given the details of your procedure findings, then the procedure report has been included in a sealed envelope for you to review at your convenience later.  YOU SHOULD EXPECT: Some feelings of bloating in the abdomen. Passage of more gas than usual.  Walking can help get rid of the air that was put into your GI tract during the procedure and reduce the bloating. If you had a lower endoscopy (such as a colonoscopy or flexible sigmoidoscopy) you may notice spotting of blood in your stool or on the toilet paper. If you underwent a bowel prep for your procedure, you may not have a normal bowel movement for a few days.  Please Note:  You might notice some irritation and congestion in your nose or some drainage.  This is from the oxygen used during your procedure.  There is no need for concern and it should clear up in a day or so.  SYMPTOMS TO REPORT IMMEDIATELY:  Following upper endoscopy (EGD)  Vomiting of blood or coffee ground material  New chest pain or pain under the shoulder blades  Painful or persistently difficult swallowing  New shortness of breath  Fever of 100F or higher  Black, tarry-looking stools  For urgent or emergent issues, a gastroenterologist can be reached at any hour by calling [phone]. Do not use MyChart messaging for urgent concerns.    DIET:  We do recommend a small meal at first, but then you may proceed to your regular diet.  Drink plenty of  fluids but you should avoid alcoholic beverages for 24 hours.  ACTIVITY:  You should plan to take it easy for the rest of today and you should NOT DRIVE or use heavy machinery until tomorrow (because of the sedation medicines used during the test).    FOLLOW UP: Our staff will call the number listed on your records 48-72 hours following your procedure to check on you and address any questions or concerns that you may have regarding the information given to you following your procedure. If we do not reach you, we will leave a message.  We will attempt to reach you two times.  During this call, we will ask if you have developed any symptoms of COVID 19. If you develop any symptoms (ie: fever, flu-like symptoms, shortness of breath, cough etc.) before then, please call [phone].  If you test positive for Covid 19 in the 2 weeks post procedure, please call and report this information to [place].    If any biopsies were taken you will be contacted by phone or by letter within the next 1-3 weeks.  Please call us at [phone] if you have not heard about the biopsies in 3 weeks.    SIGNATURES/CONFIDENTIALITY: You and/or your care partner have signed paperwork which will be entered into your electronic medical record.  These signatures attest to the fact that that the information above on your After Visit Summary has been  reviewed and is understood.  Full responsibility of the confidentiality of this discharge information lies with you and/or your care-partner.

## 2021-06-09 NOTE — Progress Notes (Signed)
Pt non-responsive, VVS, Report to RN  °

## 2021-06-09 NOTE — Progress Notes (Signed)
Called to room to assist during endoscopic procedure.  Patient ID and intended procedure confirmed with present staff. Received instructions for my participation in the procedure from the performing physician.  

## 2021-06-09 NOTE — Op Note (Addendum)
[place] Patient Name: Terry Parks Procedure Date: 06/09/2021 9:45 AM MRN: [id number] Endoscopist: [name] , MD Age: 66 Referring MD:  Date of Birth: 11-25-1955 Gender: Male Account #: [id number] Procedure:                Upper GI endoscopy Indications:              Epigastric abdominal pain, Iron deficiency,                            Heartburn, Coffee-ground emesis, Hematemesis Medicines:                Monitored Anesthesia Care Procedure:                Pre-Anesthesia Assessment:                           - Prior to the procedure, a History and Physical                            was performed, and patient medications and                            allergies were reviewed. The patient's tolerance of                            previous anesthesia was also reviewed. The risks                            and benefits of the procedure and the sedation                            options and risks were discussed with the patient.                            All questions were answered, and informed consent                            was obtained. Prior Anticoagulants: The patient has                            taken Xarelto (rivaroxaban), last dose was 3 days                            prior to procedure. ASA Grade Assessment: III - A                            patient with severe systemic disease. After                            reviewing the risks and benefits, the patient was                            deemed in satisfactory condition to undergo the  procedure.                           After obtaining informed consent, the endoscope was                            passed under direct vision. Throughout the                            procedure, the patient's blood pressure, pulse, and                            oxygen saturations were monitored continuously. The                            0405 PCF-H190TL Slim SB Colonoscope was  introduced                            through the mouth, and advanced to the second part                            of duodenum. The upper GI endoscopy was                            accomplished without difficulty. The patient                            tolerated the procedure. Scope In: Scope Out: Findings:                 No gross lesions were noted in the proximal                            esophagus and in the mid esophagus.                           LA Grade D (one or more mucosal breaks involving at                            least 75% of esophageal circumference) esophagitis                            with no bleeding was found in the distal esophagus.                            Biopsies were taken with a cold forceps for                            histology.                           The Z-line was irregular and was found 40 cm from                            the incisors.  A 3 cm hiatal hernia was present.                           No gross lesions were noted in the entire examined                            stomach.                           No gross lesions were noted in the duodenal bulb,                            in the first portion of the duodenum and in the                            second portion of the duodenum. Complications:            No immediate complications. Estimated Blood Loss:     Estimated blood loss was minimal. Impression:               - No gross lesions in esophagus proximally. LA                            Grade D erosive esophagitis with no bleeding was                            found distally. Biopsied.                           - Z-line irregular, 40 cm from the incisors.                           - 3 cm hiatal hernia.                           - No gross lesions in the stomach.                           - No gross lesions in the duodenal bulb, in the                            first portion of the duodenum and in the  second                            portion of the duodenum. Recommendation:           - The patient will be observed post-procedure,                            until all discharge criteria are met.                           - Discharge patient to home.                           -  Patient has a contact number available for                            emergencies. The signs and symptoms of potential                            delayed complications were discussed with the                            patient. Return to normal activities tomorrow.                            Written discharge instructions were provided to the                            patient.                           - Resume previous diet.                           - Nexium 40 mg twice daily needs to be restarted                            and maintained.                           - Continue present medications.                           - May restart Xarelto on 1/25AM.                           - Repeat EGD in 4 months to ensure healing of                            esophagitis and see if we can decrease PPI dosing                            to daily.                           - With patient having such significant issues, may                            need to consider esophageal fundoplication at some                            point in the future.                           - Patient wants a referral for consideration of                            ventral hernia repair (most likely to [facility]). He  will get back in touch with [place] with the particular                            surgeon he wants a referral to.                           - The findings and recommendations were discussed                            with the patient.                           - The findings and recommendations were discussed                            with the patient's family. [name], MD 06/09/2021 10:21:45  AM

## 2021-06-11 ENCOUNTER — Encounter: Payer: Self-pay | Admitting: Gastroenterology

## 2021-06-11 ENCOUNTER — Other Ambulatory Visit (HOSPITAL_COMMUNITY): Payer: Self-pay

## 2021-06-11 ENCOUNTER — Telehealth: Payer: Self-pay

## 2021-06-11 NOTE — Telephone Encounter (Signed)
°  Follow up Call-  Call back number 06/09/2021 01/11/2019  Post procedure Call Back phone  # [phone]  Permission to leave phone message Yes Yes  Some recent data might be hidden     Patient questions:  Do you have a fever, pain , or abdominal swelling? No. Pain Score  0 *  Have you tolerated food without any problems? Yes.    Have you been able to return to your normal activities? Yes.    Do you have any questions about your discharge instructions: Diet   No. Medications  No. Follow up visit  No.  Do you have questions or concerns about your Care? No.  Actions: * If pain score is 4 or above: No action needed, pain <4.

## 2021-06-14 ENCOUNTER — Other Ambulatory Visit: Payer: Self-pay | Admitting: Cardiovascular Disease

## 2021-06-15 DIAGNOSIS — R32 Unspecified urinary incontinence: Secondary | ICD-10-CM | POA: Diagnosis not present

## 2021-06-15 DIAGNOSIS — E119 Type 2 diabetes mellitus without complications: Secondary | ICD-10-CM | POA: Diagnosis not present

## 2021-06-15 DIAGNOSIS — I10 Essential (primary) hypertension: Secondary | ICD-10-CM | POA: Diagnosis not present

## 2021-06-16 ENCOUNTER — Ambulatory Visit: Payer: Self-pay

## 2021-06-22 ENCOUNTER — Other Ambulatory Visit: Payer: Self-pay | Admitting: Student

## 2021-06-22 ENCOUNTER — Other Ambulatory Visit: Payer: Self-pay | Admitting: Cardiovascular Disease

## 2021-06-22 DIAGNOSIS — E1142 Type 2 diabetes mellitus with diabetic polyneuropathy: Secondary | ICD-10-CM

## 2021-06-29 ENCOUNTER — Other Ambulatory Visit: Payer: Self-pay | Admitting: Obstetrics and Gynecology

## 2021-06-29 ENCOUNTER — Other Ambulatory Visit: Payer: Self-pay

## 2021-06-29 NOTE — Patient Outreach (Signed)
Medicaid Managed Care   Nurse Care Manager Note  06/29/2021 Name:  Terry Parks MRN:  [id number] DOB:  03/04/1956  Terry Parks is an 66 y.o. year old male who is a primary patient of [name], MD.  The [facility] Managed Care Coordination team was consulted for assistance with:    Chronic healthcare management needs, HTN, DM, HLD, OSA CKD, chronic pain, vision loss right eye  Terry Parks was given information about Medicaid Managed Care Coordination team services today. Terry Parks Patient agreed to services and verbal consent obtained.  Engaged with patient by telephone for follow up visit in response to provider referral for case management and/or care coordination services.   Assessments/Interventions:  Review of past medical history, allergies, medications, health status, including review of consultants reports, laboratory and other test data, was performed as part of comprehensive evaluation and provision of chronic care management services.  SDOH (Social Determinants of Health) assessments and interventions performed: SDOH Interventions    Flowsheet Row Most Recent Value  SDOH Interventions   Intimate Partner Violence Interventions Intervention Not Indicated  Physical Activity Interventions Other (Comments)  [foot and back pain right now-unable to]       Care Plan  Allergies  Allergen Reactions   Lisinopril Cough    Medications Reviewed Today     Reviewed by Terry Medicus, RN (Registered Nurse) on 06/29/21 at Trainer List Status: <None>   Medication Order Taking? Sig Documenting Provider Last Dose Status Informant  Accu-Chek Softclix Lancets lancets [id number]  Check blood sugar three times a day as instructed [name], MD  Active   amiodarone (PACERONE) 200 MG tablet [id number]  Take 1 tablet (200 mg total) by mouth daily. [name], [name], MD  Active   amLODipine (NORVASC) 5 MG tablet [id number]  TAKE ONE TABLET BY MOUTH EVERY EVENING [name],  [name], MD  Active   atorvastatin (LIPITOR) 40 MG tablet [id number]  TAKE ONE TABLET BY MOUTH EVERY EVENING [name], [name], MD  Active   Blood Glucose Monitoring Suppl (ACCU-CHEK GUIDE) w/Device KIT [id number]  1 Units by Does not apply route 3 (three) times daily. [name], MD  Active   carvedilol (COREG) 25 MG tablet [id number]  Take 1 tablet (25 mg total) by mouth 2 (two) times daily with a meal. [name], [name], MD  Active   COMFORT EZ PEN NEEDLES 31G X 6 MM MISC [id number]  USE AS DIRECTED AT BEDTIME [name], MD  Active   COMFORT EZ PEN NEEDLES 32G X 6 MM MISC [id number]   [provider]  Active   DULoxetine (CYMBALTA) 60 MG capsule [id number]  Take 1 capsule (60 mg total) by mouth daily. [name], MD  Active   empagliflozin (JARDIANCE) 25 MG TABS tablet [id number]  TAKE 1 TABLET (25 MG TOTAL) BY MOUTH DAILY BEFORE BREAKFAST. [name], MD  Active   esomeprazole (NEXIUM) 40 MG capsule [id number]  Take 1 capsule (40 mg total) by mouth 2 (two) times daily before a meal. Mansouraty, [name], MD  Active   ezetimibe (ZETIA) 10 MG tablet [id number]  Take 1 tablet (10 mg total) by mouth daily. [name], [name], MD  Active   ferrous gluconate (FERGON) 324 MG tablet [id number] Yes Take 1 tablet (324 mg total) by mouth daily with breakfast. Mansouraty, [name], MD Taking Active   glucose blood (ACCU-CHEK GUIDE) test strip [id number]  Use as instructed to check blood sugar 3 times daily [name], MD  Active   Insulin Degludec-Liraglutide Claris Che)  100-3.6 UNIT-MG/ML SOPN [id number]  Inject 52 Units into the skin daily. [name], MD  Active   rivaroxaban (XARELTO) 20 MG TABS tablet [id number]  Take 1 tablet (20 mg total) by mouth daily with breakfast. [name], [name], MD  Active   sacubitril-valsartan (ENTRESTO) 97-103 MG [id number]  Take 1 tablet by mouth 2 (two) times daily. [name], [name], MD  Active   spironolactone (ALDACTONE) 50 MG tablet [id number]  TAKE ONE  TABLET BY MOUTH EVERY MORNING [name], [name], MD  Active   sucralfate (CARAFATE) 1 GM/10ML suspension [id number]  Take 10 mLs (1 g total) by mouth 4 (four) times daily. Mansouraty, [name], MD  Active   torsemide (DEMADEX) 20 MG tablet [id number]  TAKE TWO TABLETS BY MOUTH EVERY MORNING [name], [name], MD  Active             Patient Active Problem List   Diagnosis Date Noted   Recurrent abdominal hernia without obstruction or gangrene 05/22/2021   Pyrosis 05/22/2021   Indigestion 34/35/6861   Periumbilical pain 68/37/2902   Abdominal pain, epigastric 05/22/2021   Class 3 obesity ([place]) 11/10/2020   S/P TURP 11/10/2020   History of iron deficiency anemia 12/16/2019   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   Depression 01/31/2019   Iron deficiency anemia 04/28/2018   CKD stage 3 due to type 2 diabetes mellitus ([place]) 02/11/2018   Healthcare maintenance 02/11/2018   Persistent atrial fibrillation    Vitamin D deficiency 06/22/2016   Allergic rhinitis 10/19/2012   Diabetic retinopathy ([place]) 10/04/2012   Type 2 diabetes mellitus with peripheral neuropathy ([place]) 04/08/2008   Obstructive sleep apnea    Morbid obesity ([place])    Hyperlipidemia associated with type 2 diabetes mellitus ([place])    Hypertension associated with diabetes ([place])    Chronic combined systolic and diastolic CHF (congestive heart failure) ([place]) 03/01/2006   Conditions to be addressed/monitored per PCP order:  Chronic healthcare management needs, HTN, DM, HLD, OSA CKD, chronic pain, vision loss right eye, CHF  Care Plan : CCM RN- Diabetes Type 2 (Adult)  Updates made by Terry Medicus, RN since 06/29/2021 12:00 AM     Problem: Glycemic Management (Diabetes, Type 2)   Priority: High  Onset Date: 06/03/2020     Long-Range Goal: Glycemic Management Optimized   Start Date: 07/20/2019  Expected End Date: 07/23/2021  Recent Progress: Not on track  Priority: High  Note:    CARE PLAN ENTRY (see longitudinal plan of  care for additional care plan information)  Current Barriers:  Knowledge Deficits related to basic Diabetes pathophysiology and self care/management Knowledge Deficits related to medications used for management of diabetes 06/29/21:  Hgb A1C= 7.4 on 04/15/21.  Continues to check blood sugars daily, blood sugar this morning 112, goal < 150.  Case Manager Clinical Goal(s):  Over the next 30-60  days, patient will demonstrate improved adherence to prescribed treatment plan for diabetes self care/management as evidenced by:  daily monitoring and recording of CBG  adherence to ADA/ carb modified diet adherence to prescribed medication regimen  Interventions:  Reviewed education with patient about basic DM disease process Reviewed medications and assessed medication taking behavior Ensured patient has ample supply of all prescribed medications Advised patient, providing education and rationale, to check CBG at least daily and record, calling provider and/or CCM RN for findings outside established parameters. Educated patient that leg and foot pain will likely decrease if blood sugar control is improved and a CGM could provide that assistance  Discussed plans with patient for ongoing care management follow up and provided patient with direct contact information for care management team Review of patient status, including review of consultants reports, relevant laboratory and other test results, and medications completed.  Patient Self Care Activities:  UNABLE to independently self manage DM Self administers oral medications as prescribed Self administers insulin as prescribed Self administers injectable DM medication Xultophy as prescribed Attends all scheduled provider appointments Checks blood sugars as prescribed and utilize hyper and hypoglycemia protocol as needed Adheres to prescribed ADA/carb modified check blood sugar at prescribed times - check blood sugar if I feel it is too high or  too low - enter blood sugar readings and medication or insulin into my phone log - take the blood sugar log to all doctor visits - take the blood sugar meter to all doctor visits    Care Plan : CCM RN- Chronic Pain (Adult)- lower extremeties  Updates made by Terry Medicus, RN since 06/29/2021 12:00 AM     Problem: Chronic Pain Management (Chronic Pain)   Priority: High  Onset Date: 07/15/2020     Long-Range Goal: Chronic Pain Managed   Start Date: 07/15/2020  Expected End Date: 09/26/2021  Recent Progress: On track  Priority: High  Note:   Current Barriers:  Knowledge Deficits related to self-health management of chronic lower extremity pain Chronic Disease Management support and education needs related to chronic pain 06/29/21:  Patient denies abdominal pain today, ongoing back and foot pain-soaks and uses massage to help. Clinical Goal(s):  patient will verbalize understanding of plan for pain management.  and patient will use pharmacological and nonpharmacological pain relief strategies as prescribed.  Interventions:  Pain assessment performed Medications reviewed Discussed plans with patient for ongoing care management follow up and provided patient with direct contact information for care management team Discussed improvement  in DM control will likely result in less pain in legs and feet  Patient Goals/Self Care Activities:  Will self-administer medications as prescribed Will attend all scheduled provider appointments Will call pharmacy for medication refills 7 days prior to needed refill date Patient will calls provider office for new concerns or questions Follow Up Plan: The care management team will reach out to the patient again over the next 30-60 days.    Care Plan : General Plan of Care (Adult)  Updates made by Terry Medicus, RN since 06/29/2021 12:00 AM     Problem: Health Promotion or Disease Self-Management (General Plan of Care)   Priority: Medium  Onset Date:  10/07/2020     Long-Range Goal: Self-Management Plan Developed   Start Date: 10/07/2020  Expected End Date: 08/27/2021  Recent Progress: On track  Priority: Medium  Note:   Current Barriers:  Ineffective Self Health Maintenance Patient needs aide and eyeglasses Currently UNABLE TO independently self manage needs related to chronic health conditions.  Knowledge Deficits related to short term plan for care coordination needs and long term plans for chronic disease management needs 06/29/21:  patient would like referral to general surgeon for hernia follow up and to a podiatrist. Nurse Case Manager Clinical Goal(s):  patient will work with care management team to address care coordination and chronic disease management needs related to Disease Management Educational Needs Care Coordination Medication Management and Education Medication Reconciliation Medication Assistance  Psychosocial Support   Interventions:  Evaluation of current treatment plan and patient's adherence to plan as established by provider. Reviewed medications with patient. Collaborated with pharmacy regarding referrals. Collaborated  with PCP for referrals. Collaborated with Care Guide for eyeglass provider that accepts patient's insurance-completed Care Guide referral for eyeglass provider-completed Discussed plans with patient for ongoing care management follow up and provided patient with direct contact information for care management team Advised patient, providing education and rationale, to monitor blood pressure daily and record, calling provider for findings outside established parameters.  Reviewed scheduled/upcoming provider appointments. Collaborated with PCP for general surgeon and podiatry referral. Advised patient, providing education and rationale, to check cbg and record, calling provider for findings outside established parameters.   Pharmacy referral for medication review. Advised patient to contact  insurance company regarding resumption of aide service. Self Care Activities:  Patient will self administer medications as prescribed Patient will attend all scheduled provider appointments Patient will call pharmacy for medication refills Patient will continue to perform ADL's independently Patient will call provider office for new concerns or questions Patient Goals: In the next 30 days, patient will meet with Pharmacist for medication review. In the next 30 days, patient will call [facility] for aide. In the next 30 days, patient will attend all appointments. - Follow Up Plan: The patient has been provided with contact information for the care management team and has been advised to call with any health related questions or concerns.  The care management team will reach out to the patient again over the next 30 days.    Follow Up:  Patient agrees to Care Plan and Follow-up.  Plan: The Managed Medicaid care management team will reach out to the patient again over the next 30 days. and The  Patient has been provided with contact information for the Managed Medicaid care management team and has been advised to call with any health related questions or concerns.  Date/time of next scheduled RN care management/care coordination outreach: 07/27/21 at 0900.

## 2021-06-29 NOTE — Patient Instructions (Signed)
Hi Mr. Dralle, nice to speak with you today, have a nice day!  Mr. Hollenbeck was given information about Medicaid Managed Care team care coordination services as a part of their [place] Medicaid benefit. Rendon Howell verbally consented to engagement with the [facility] Managed Care team.   If you are experiencing a medical emergency, please call 911 or report to your local emergency department or urgent care.   If you have a non-emergency medical problem during routine business hours, please contact your provider's office and ask to speak with a nurse.   For questions related to your [facility], please call: [phone] or visit the homepage here: [email or web address]  If you would like to schedule transportation through your [facility], please call the following number at least 2 days in advance of your appointment: [phone].   Call the [place] at [phone], at any time, 24 hours a day, 7 days a week. If you are in danger or need immediate medical attention call 911.  If you would like help to quit smoking, call 1-800-QUIT-NOW (315)303-8448) OR Espaol: 1-855-Djelo-Ya (6-761-950-9326) o para ms informacin haga clic aqu or Text READY to 200-400 to register via text  Mr. Albus - following are the goals we discussed in your visit today:   Goals Addressed             This Visit's Progress    Monitor and Manage My Blood Sugar-Diabetes Type 2       Timeframe:  Long-Range Goal Priority:  High Start Date:        06/03/20                     Expected End Date:   ongoing               Follow Up Date:  07/27/21   - check blood sugar at prescribed times - check blood sugar if I feel it is too high or too low - enter blood sugar readings and medication or insulin into my phone log - take the blood sugar log to all doctor  visits - take the blood sugar meter to all doctor visits    Why is this important?   Checking your blood sugar at home helps to keep it from getting very high or very low.  Writing the results in a diary or log helps the doctor know how to care for you.  Your blood sugar log should have the time, date and the results.  Also, write down the amount of insulin or other medicine that you take.  Other information, like what you ate, exercise done and how you were feeling, will also be helpful.     Notes:-  06/29/21:  patient checks blood sugar every morning-112 today, goal < 150.     Protect My Health       Timeframe:  Long-Range Goal Priority:  Medium Start Date:      10/07/20                       Expected End Date:  ongoing                Follow Up Date:  07/27/21   - schedule appointment for flu shot - schedule appointment for vaccines needed due to my age or health - schedule recommended health tests. - schedule and keep appointment for annual check-up  Why is this important?   Screening tests can find diseases early when they are easier to treat.  Your doctor or nurse will talk with you about which tests are important for you.  Getting shots for common diseases like the flu and shingles will help prevent them.   06/29/21:  patient has appointment with urologist tomorrow for follow up, needs referral to general surgeon regarding hiatal hernia and also needs a podiatrist-will send a message to PCP for referral.     Track and Manage Fluids and Swelling-Heart Failure       Timeframe:  Long-Range Goal Priority:  High Start Date:          01/14/20                   Expected End Date:   ongoing     Follow Up Date: 07/27/21   - call office if I gain more than 2 pounds in one day or 5 pounds in one week - do ankle pumps when sitting - use salt in moderation - watch for swelling in feet, ankles and legs every day - weigh myself daily  - log weight in my phone   Why is this  important?   It is important to check your weight daily and watch how much salt and liquids you have.  It will help you to manage your heart failure.    Notes: 06/29/21:  No complaints today-patient does not weigh himself     Track and Manage My Blood Pressure-Hypertension       Timeframe:  Long-Range Goal Priority:  High Start Date:     06/03/20                        Expected End Date:  ongoing                 Follow Up Date: 07/27/21  - check blood pressure daily - write blood pressure results in my phone log    Why is this important?   You won't feel high blood pressure, but it can still hurt your blood vessels.  High blood pressure can cause heart or kidney problems. It can also cause a stroke.  Making lifestyle changes like losing a little weight or eating less salt will help.  Checking your blood pressure at home and at different times of the day can help to control blood pressure.  If the doctor prescribes medicine remember to take it the way the doctor ordered.  Call the office if you cannot afford the medicine or if there are questions about it.     Notes:  06/29/21:  patient checks blood pressure every day, 145/88 today    Patient verbalizes understanding of instructions and care plan provided today and agrees to view in [place]. Active MyChart status confirmed with patient.    The Managed Medicaid care management team will reach out to the patient again over the next 30 days.  The  Patient has been provided with contact information for the Managed Medicaid care management team and has been advised to call with any health related questions or concerns.   [name] RN, BSN [place] Management Coordinator - Managed Medicaid High Risk [phone]   Following is a copy of your plan of care:  Care Plan : CCM RN- Diabetes Type 2 (Adult)  Updates made by [name], RN since 06/29/2021 12:00 AM  Problem: Glycemic Management  (Diabetes, Type 2)   Priority: High  Onset Date: 06/03/2020     Long-Range Goal: Glycemic Management Optimized   Start Date: 07/20/2019  Expected End Date: 07/23/2021  Recent Progress: Not on track  Priority: High  Note:    CARE PLAN ENTRY (see longitudinal plan of care for additional care plan information)  Current Barriers:  Knowledge Deficits related to basic Diabetes pathophysiology and self care/management Knowledge Deficits related to medications used for management of diabetes 06/29/21:  Hgb A1C= 7.4 on 04/15/21.  Continues to check blood sugars daily, blood sugar this morning 112, goal < 150.  Case Manager Clinical Goal(s):  Over the next 30-60  days, patient will demonstrate improved adherence to prescribed treatment plan for diabetes self care/management as evidenced by:  daily monitoring and recording of CBG  adherence to ADA/ carb modified diet adherence to prescribed medication regimen  Interventions:  Reviewed education with patient about basic DM disease process Reviewed medications and assessed medication taking behavior Ensured patient has ample supply of all prescribed medications Advised patient, providing education and rationale, to check CBG at least daily and record, calling provider and/or CCM RN for findings outside established parameters. Educated patient that leg and foot pain will likely decrease if blood sugar control is improved and a CGM could provide that assistance  Discussed plans with patient for ongoing care management follow up and provided patient with direct contact information for care management team Review of patient status, including review of consultants reports, relevant laboratory and other test results, and medications completed.  Patient Self Care Activities:  UNABLE to independently self manage DM Self administers oral medications as prescribed Self administers insulin as prescribed Self administers injectable DM medication Xultophy as  prescribed Attends all scheduled provider appointments Checks blood sugars as prescribed and utilize hyper and hypoglycemia protocol as needed Adheres to prescribed ADA/carb modified check blood sugar at prescribed times - check blood sugar if I feel it is too high or too low - enter blood sugar readings and medication or insulin into my phone log - take the blood sugar log to all doctor visits - take the blood sugar meter to all doctor visits    Care Plan : CCM RN- Chronic Pain (Adult)- lower extremeties  Updates made by [name], RN since 06/29/2021 12:00 AM     Problem: Chronic Pain Management (Chronic Pain)   Priority: High  Onset Date: 07/15/2020     Long-Range Goal: Chronic Pain Managed   Start Date: 07/15/2020  Expected End Date: 09/26/2021  Recent Progress: On track  Priority: High  Note:   Current Barriers:  Knowledge Deficits related to self-health management of chronic lower extremity pain Chronic Disease Management support and education needs related to chronic pain 06/29/21:  Patient denies abdominal pain today, ongoing back and foot pain-soaks and uses massage to help. Clinical Goal(s):  patient will verbalize understanding of plan for pain management.  and patient will use pharmacological and nonpharmacological pain relief strategies as prescribed.  Interventions:  Pain assessment performed Medications reviewed Discussed plans with patient for ongoing care management follow up and provided patient with direct contact information for care management team Discussed improvement  in DM control will likely result in less pain in legs and feet  Patient Goals/Self Care Activities:  Will self-administer medications as prescribed Will attend all scheduled provider appointments Will call pharmacy for medication refills 7 days prior to needed refill date Patient will calls provider office for new  concerns or questions Follow Up Plan: The care management team will reach out  to the patient again over the next 30-60 days.    Care Plan : General Plan of Care (Adult)  Updates made by [name], RN since 06/29/2021 12:00 AM     Problem: Health Promotion or Disease Self-Management (General Plan of Care)   Priority: Medium  Onset Date: 10/07/2020     Long-Range Goal: Self-Management Plan Developed   Start Date: 10/07/2020  Expected End Date: 08/27/2021  Recent Progress: On track  Priority: Medium  Note:   Current Barriers:  Ineffective Self Health Maintenance Patient needs aide and eyeglasses Currently UNABLE TO independently self manage needs related to chronic health conditions.  Knowledge Deficits related to short term plan for care coordination needs and long term plans for chronic disease management needs 06/29/21:  patient would like referral to general surgeon for hernia follow up and to a podiatrist. Nurse Case Manager Clinical Goal(s):  patient will work with care management team to address care coordination and chronic disease management needs related to Disease Management Educational Needs Care Coordination Medication Management and Education Medication Reconciliation Medication Assistance  Psychosocial Support   Interventions:  Evaluation of current treatment plan and patient's adherence to plan as established by provider. Reviewed medications with patient. Collaborated with pharmacy regarding referrals. Collaborated with PCP for referrals. Collaborated with Care Guide for eyeglass provider that accepts patient's insurance-completed Care Guide referral for eyeglass provider-completed Discussed plans with patient for ongoing care management follow up and provided patient with direct contact information for care management team Advised patient, providing education and rationale, to monitor blood pressure daily and record, calling provider for findings outside established parameters.  Reviewed scheduled/upcoming provider  appointments. Collaborated with PCP for general surgeon and podiatry referral. Advised patient, providing education and rationale, to check cbg and record, calling provider for findings outside established parameters.   Pharmacy referral for medication review. Advised patient to contact insurance company regarding resumption of aide service. Self Care Activities:  Patient will self administer medications as prescribed Patient will attend all scheduled provider appointments Patient will call pharmacy for medication refills Patient will continue to perform ADL's independently Patient will call provider office for new concerns or questions Patient Goals: In the next 30 days, patient will meet with Pharmacist for medication review. In the next 30 days, patient will call [facility] for aide. In the next 30 days, patient will attend all appointments. - Follow Up Plan: The patient has been provided with contact information for the care management team and has been advised to call with any health related questions or concerns.  The care management team will reach out to the patient again over the next 30 days.

## 2021-06-30 ENCOUNTER — Other Ambulatory Visit: Payer: Self-pay

## 2021-06-30 ENCOUNTER — Other Ambulatory Visit: Payer: Self-pay | Admitting: Student

## 2021-06-30 ENCOUNTER — Other Ambulatory Visit: Payer: Self-pay | Admitting: Obstetrics and Gynecology

## 2021-06-30 DIAGNOSIS — C61 Malignant neoplasm of prostate: Secondary | ICD-10-CM | POA: Diagnosis not present

## 2021-06-30 DIAGNOSIS — K458 Other specified abdominal hernia without obstruction or gangrene: Secondary | ICD-10-CM

## 2021-06-30 DIAGNOSIS — Z862 Personal history of diseases of the blood and blood-forming organs and certain disorders involving the immune mechanism: Secondary | ICD-10-CM

## 2021-06-30 DIAGNOSIS — N3941 Urge incontinence: Secondary | ICD-10-CM | POA: Diagnosis not present

## 2021-06-30 DIAGNOSIS — N393 Stress incontinence (female) (male): Secondary | ICD-10-CM | POA: Diagnosis not present

## 2021-06-30 DIAGNOSIS — N5231 Erectile dysfunction following radical prostatectomy: Secondary | ICD-10-CM | POA: Diagnosis not present

## 2021-06-30 DIAGNOSIS — E1142 Type 2 diabetes mellitus with diabetic polyneuropathy: Secondary | ICD-10-CM

## 2021-06-30 NOTE — Patient Outreach (Signed)
Care Coordination  06/30/2021  Terry Parks 04-Oct-1955 [id number]  RNCM called patient to update him on referral status.  [name] RN, BSN [place]   [name] - Managed Medicaid High Risk [phone].

## 2021-07-06 DIAGNOSIS — E119 Type 2 diabetes mellitus without complications: Secondary | ICD-10-CM | POA: Diagnosis not present

## 2021-07-06 DIAGNOSIS — I10 Essential (primary) hypertension: Secondary | ICD-10-CM | POA: Diagnosis not present

## 2021-07-06 DIAGNOSIS — R32 Unspecified urinary incontinence: Secondary | ICD-10-CM | POA: Diagnosis not present

## 2021-07-07 ENCOUNTER — Other Ambulatory Visit: Payer: Self-pay

## 2021-07-07 ENCOUNTER — Other Ambulatory Visit: Payer: Self-pay | Admitting: Obstetrics and Gynecology

## 2021-07-07 NOTE — Patient Outreach (Signed)
Care Coordination  07/07/2021  Terry Parks 09/28/1955 [id number]  RNCM called patient to follow up on requested referrals and transportation.  Patient stated he has not received any information regarding referrals-patient to follow up with PCP regarding referrals.  Patient stated he has [place] Medicaid transportation phone number to schedule transportation as needed.  [name] RN, BSN [place]   [name] - Managed Medicaid High Risk [phone].

## 2021-07-12 ENCOUNTER — Other Ambulatory Visit: Payer: Self-pay | Admitting: Cardiovascular Disease

## 2021-07-12 DIAGNOSIS — I4819 Other persistent atrial fibrillation: Secondary | ICD-10-CM

## 2021-07-21 ENCOUNTER — Other Ambulatory Visit: Payer: Self-pay

## 2021-07-21 ENCOUNTER — Encounter: Payer: Self-pay | Admitting: Podiatry

## 2021-07-21 ENCOUNTER — Ambulatory Visit (INDEPENDENT_AMBULATORY_CARE_PROVIDER_SITE_OTHER): Payer: Medicaid Other | Admitting: Podiatry

## 2021-07-21 DIAGNOSIS — M79675 Pain in left toe(s): Secondary | ICD-10-CM

## 2021-07-21 DIAGNOSIS — B351 Tinea unguium: Secondary | ICD-10-CM

## 2021-07-21 DIAGNOSIS — M79674 Pain in right toe(s): Secondary | ICD-10-CM

## 2021-07-21 DIAGNOSIS — E1142 Type 2 diabetes mellitus with diabetic polyneuropathy: Secondary | ICD-10-CM

## 2021-07-21 NOTE — Progress Notes (Signed)
?Subjective:  ?Patient ID: Terry Parks, male    DOB: 1955-10-20,   MRN: [id number] ? ?Chief Complaint  ?Patient presents with  ? Diabetes  ?  Foot exam   ? ? ?66 y.o. male presents for concern of thickened elongated and painful nails that are difficult to trim. Requesting to have them trimmed today. Denies any burning or tingling in her feet. Patient is diabetic and last A1c was 7.4  ? [name] Denies any other pedal complaints. Denies n/v/f/c.  ? ?PCP: [name] MD  ? ?Past Medical History:  ?Diagnosis Date  ? Abscessed tooth   ? top back large cavity no pain or drainage, one on bottom  pt pulled tooth 4-5 months ago, right top large hole in tooth  ? AKI (acute kidney injury) ([place])   ? Allergic rhinitis   ? Anemia   ? Anxiety   ? Asthma   ? Atrial fibrillation ([place])   ? BPH (benign prostatic hypertrophy)   ? Massive BPH noted on cystoscopy 1/23/ 2012 by Dr. Risa Grill.  ? Cancer [facility])   ? prostate cancer 2019  ? Cardiomyopathy ([place])   ? CHF (congestive heart failure) ([place])   ? Cough 03/30/2012  ? Depression   ? Diabetes mellitus 04/08/2008  ? type 2  ? Dyspnea   ? Dysrhythmia 2019  ? Foley catheter in place 07-05-17 placed  ? Fracture, orbital ([place]) 2021  ? Right  ? GERD (gastroesophageal reflux disease) 2020  ? Headache(784.0)   ? hx migraines none recent  ? History of esophagitis 12/16/2019  ? Hyperlipemia   ? Hypertension   ? Hypertensive cardiopathy 03/01/2006  ? 2-D echocardiogram 02/01/2012 showed moderate LVH, mildly to moderately reduced left ventricular systolic function with an estimated ejection fraction of 40-45%, and diffuse hypokinesis.  A nuclear medicine stress study done 01/31/2012 showed no reversible ischemia, a small mid anterior wall fixed defect/infarct, and ejection fraction 42%.      ? Neck pain   ? Nephrolithiasis 05/29/2010  ? CT scan of abdomen/pelvis on 05/29/2010 showed an obstructing approximate 1-2 mm calculus at the left UVJ, and an approximate 1-2 mm left lower pole renal calculus.   Patient  had continuing severe pain , and an elevation of his serum creatinine to a value of 1.75 on 06/06/2010.  Patient underwent cystoscopy on 06/08/2010 by Dr. Risa Grill, but attempts at retrograde pyelogram and ureteroscopy were unsucc  ? Numbness 01/08/2018  ? Obstructive sleep apnea 03/06/2008  ? Sleep study 03/06/08 showed severe OSA/hypopnea syndrome, with successful CPAP titration to 13 CWP using a medium ResMed Mirage Quattro full face mask with heated humidifier.   ? Rash 04/17/2014  ? Renal calculus 05/29/2010  ? CT scan of abdomen/pelvis on 05/29/2010 showed an obstructing approximate 1-2 mm calculus at the left UVJ, and an approximate 1-2 mm left lower pole renal calculus.   Patient had continuing severe pain , and an elevation of his serum creatinine to a value of 1.75 on 06/06/2010.  The stone had apparently passed and was not seen on repeat CT 06/08/2010.  ? Sleep apnea   ? haven't use cpap in 2 years  ? Tooth pain 10/29/2017  ? Urinary straining 11/02/2016  ? ? ?Objective:  ?Physical Exam: ?Vascular: DP/PT pulses 2/4 bilateral. CFT <3 seconds. Absent hair growth on digits. Edema noted to bilateral lower extremities. Xerosis noted bilaterally.  ?Skin. No lacerations or abrasions bilateral feet. Nails 1-5 bilateral  are thickened discolored and elongated with subungual debris.  ?Musculoskeletal: MMT  5/5 bilateral lower extremities in DF, PF, Inversion and Eversion. Deceased ROM in DF of ankle joint.  ?Neurological: Sensation intact to light touch. Protective sensation diminished bilateral.  ? ? ?Assessment:  ? ?1. Type 2 diabetes mellitus with peripheral neuropathy (HCC)   ?2. Pain due to onychomycosis of toenails of both feet   ? ? ? ?Plan:  ?Patient was evaluated and treated and all questions answered. ?-Discussed and educated patient on diabetic foot care, especially with  ?regards to the vascular, neurological and musculoskeletal systems.  ?-Stressed the importance of good glycemic control and the detriment of not   ?controlling glucose levels in relation to the foot. ?-Discussed supportive shoes at all times and checking feet regularly.  ?-Mechanically debrided all nails 1-5 bilateral using sterile nail nipper and filed with dremel without incident  ?-Answered all patient questions ?-Patient to return  in 3 months for at risk foot care ?-Patient advised to call the office if any problems or questions arise in the meantime. ? ? ?Lorenda Peck, DPM  ? ? ?

## 2021-07-27 ENCOUNTER — Other Ambulatory Visit: Payer: Self-pay | Admitting: Obstetrics and Gynecology

## 2021-07-27 ENCOUNTER — Other Ambulatory Visit: Payer: Self-pay

## 2021-07-27 ENCOUNTER — Telehealth: Payer: Self-pay

## 2021-07-27 DIAGNOSIS — E1142 Type 2 diabetes mellitus with diabetic polyneuropathy: Secondary | ICD-10-CM

## 2021-07-27 NOTE — Telephone Encounter (Signed)
? ?  Telephone encounter was:  Unsuccessful.  07/27/2021 ?Name: Terry Parks MRN: [id number] DOB: [birth year] ? ?Unsuccessful outbound call made today to assist with:   housing ? ?Outreach Attempt:  1st Attempt ? ?A HIPAA compliant voice message was left requesting a return call.  Instructed patient to call back at earliest convenience. ?. ? ? ? ?Larena Sox ?Care Guide, Embedded Care Coordination ?[place], Care Management  ?[phone] ?[address], [place], [place] [address] ?Phone: [phone] ?Email: [email or web address] ? ?  ?

## 2021-07-27 NOTE — Patient Instructions (Signed)
Hi Terry Parks, nice to speak with you this morning-I hope you have a wonderful day!!  [name] was given information about Medicaid Managed Care team care coordination services as a part of their [place] Medicaid benefit. Terry Parks verbally consented to engagement with the [facility] Managed Care team.   If you are experiencing a medical emergency, please call 911 or report to your local emergency department or urgent care.   If you have a non-emergency medical problem during routine business hours, please contact your provider's office and ask to speak with a nurse.   For questions related to your [facility], please call: [phone] or visit the homepage here: [email or web address]  If you would like to schedule transportation through your [facility], please call the following number at least 2 days in advance of your appointment: [phone].  Rides for urgent appointments can also be made after hours by calling Member Services.  Call the [facility] [place] [facility] at [phone], at any time, 24 hours a day, 7 days a week. If you are in danger or need immediate medical attention call 911.  If you would like help to quit smoking, call 1-800-QUIT-NOW 3511607713) OR Espaol: 1-855-Djelo-Ya (5-102-585-2778) o para ms informacin haga clic aqu or Text READY to 200-400 to register via text  Terry Parks - following are the goals we discussed in your visit today:   Goals Addressed             This Visit's Progress    Monitor and Manage My Blood Sugar-Diabetes Type 2       Timeframe:  Long-Range Goal Priority:  High Start Date:        06/03/20                     Expected End Date:   ongoing               Follow Up Date:  08/27/21   - check blood sugar at prescribed times - check blood sugar if I feel it is too high or too low - enter  blood sugar readings and medication or insulin into my phone log - take the blood sugar log to all doctor visits - take the blood sugar meter to all doctor visits    Why is this important?   Checking your blood sugar at home helps to keep it from getting very high or very low.  Writing the results in a diary or log helps the doctor know how to care for you.  Your blood sugar log should have the time, date and the results.  Also, write down the amount of insulin or other medicine that you take.  Other information, like what you ate, exercise done and how you were feeling, will also be helpful.     Notes:-  07/27/21:  Patient checks blood sugar daily, < 150     Protect My Health       Timeframe:  Long-Range Goal Priority:  Medium Start Date:      10/07/20                       Expected End Date:  ongoing                Follow Up Date:  08/27/21   - schedule appointment for flu shot - schedule appointment for vaccines needed due to my age or health -  schedule recommended health tests. - schedule and keep appointment for annual check-up   Why is this important?   Screening tests can find diseases early when they are easier to treat.  Your doctor or nurse will talk with you about which tests are important for you.  Getting shots for common diseases like the flu and shingles will help prevent them.   07/27/21:  patient seen and evaluated by Urologist and Podiatrist.  Has not seen surgeon yet.     Track and Manage Fluids and Swelling-Heart Failure       Timeframe:  Long-Range Goal Priority:  High Start Date:          01/14/20                   Expected End Date:   ongoing     Follow Up Date: 08/27/21   - call office if I gain more than 2 pounds in one day or 5 pounds in one week - do ankle pumps when sitting - use salt in moderation - watch for swelling in feet, ankles and legs every day - weigh myself daily  - log weight in my phone   Why is this important?   It is important to  check your weight daily and watch how much salt and liquids you have.  It will help you to manage your heart failure.    Notes: 07/27/21:  No change and no complaints today-does not weigh     Track and Manage My Blood Pressure-Hypertension       Timeframe:  Long-Range Goal Priority:  High Start Date:     06/03/20                        Expected End Date:  ongoing                 Follow Up Date: 08/27/21  - check blood pressure daily - write blood pressure results in my phone log    Why is this important?   You won't feel high blood pressure, but it can still hurt your blood vessels.  High blood pressure can cause heart or kidney problems. It can also cause a stroke.  Making lifestyle changes like losing a little weight or eating less salt will help.  Checking your blood pressure at home and at different times of the day can help to control blood pressure.  If the doctor prescribes medicine remember to take it the way the doctor ordered.  Call the office if you cannot afford the medicine or if there are questions about it.     Notes:  07/27/21:  B/P = 119/89 this morning-checks daily    Patient verbalizes understanding of instructions and care plan provided today and agrees to view in [place]. Active MyChart status confirmed with patient.    The Managed Medicaid care management team will reach out to the patient again over the next 30 days.  The  Patient  has been provided with contact information for the Managed Medicaid care management team and has been advised to call with any health related questions or concerns.   [name] RN, BSN [place] Management Coordinator - Managed Medicaid High Risk [phone]   Following is a copy of your plan of care:  Care Plan : CCM RN- Diabetes Type 2 (Adult)  Updates made by [name], RN since 07/27/2021 12:00 AM  Problem: Glycemic Management (Diabetes, Type 2)   Priority: High  Onset Date:  06/03/2020     Long-Range Goal: Glycemic Management Optimized   Start Date: 07/20/2019  Expected End Date: 10/23/2021  Recent Progress: Not on track  Priority: High  Note:    CARE PLAN ENTRY (see longitudinal plan of care for additional care plan information)  Current Barriers:  Knowledge Deficits related to basic Diabetes pathophysiology and self care/management Knowledge Deficits related to medications used for management of diabetes 07/27/21  Hgb A1C= 7.4 on 04/15/21.  Continues to check blood sugars daily, goal < 150.  Case Manager Clinical Goal(s):  Over the next 30-60  days, patient will demonstrate improved adherence to prescribed treatment plan for diabetes self care/management as evidenced by:  daily monitoring and recording of CBG  adherence to ADA/ carb modified diet adherence to prescribed medication regimen  Interventions:  Reviewed education with patient about basic DM disease process Reviewed medications and assessed medication taking behavior Ensured patient has ample supply of all prescribed medications Advised patient, providing education and rationale, to check CBG at least daily and record, calling provider and/or CCM RN for findings outside established parameters. Educated patient that leg and foot pain will likely decrease if blood sugar control is improved and a CGM could provide that assistance  Discussed plans with patient for ongoing care management follow up and provided patient with direct contact information for care management team Review of patient status, including review of consultants reports, relevant laboratory and other test results, and medications completed.  Patient Self Care Activities:  UNABLE to independently self manage DM Self administers oral medications as prescribed Self administers insulin as prescribed Self administers injectable DM medication Xultophy as prescribed Attends all scheduled provider appointments Checks blood sugars as  prescribed and utilize hyper and hypoglycemia protocol as needed Adheres to prescribed ADA/carb modified check blood sugar at prescribed times - check blood sugar if I feel it is too high or too low - enter blood sugar readings and medication or insulin into my phone log - take the blood sugar log to all doctor visits - take the blood sugar meter to all doctor visits    Care Plan : CCM RN- Chronic Pain (Adult)- lower extremeties  Updates made by [name], RN since 07/27/2021 12:00 AM     Problem: Chronic Pain Management (Chronic Pain)   Priority: High  Onset Date: 07/15/2020     Long-Range Goal: Chronic Pain Managed   Start Date: 07/15/2020  Expected End Date: 09/26/2021  Recent Progress: On track  Priority: High  Note:   Current Barriers:  Knowledge Deficits related to self-health management of chronic lower extremity pain Chronic Disease Management support and education needs related to chronic pain 07/27/21:  No change in abdominal pain, back and foot pain managed.  To schedule an appt. With general surgeon for hernia Clinical Goal(s):  patient will verbalize understanding of plan for pain management.  and patient will use pharmacological and nonpharmacological pain relief strategies as prescribed.  Interventions:  Pain assessment performed Medications reviewed Discussed plans with patient for ongoing care management follow up and provided patient with direct contact information for care management team Discussed improvement  in DM control will likely result in less pain in legs and feet  Patient Goals/Self Care Activities:  Will self-administer medications as prescribed Will attend all scheduled provider appointments Will call pharmacy for medication refills 7 days prior to needed refill date Patient will calls provider office for new  concerns or questions Follow Up Plan: The care management team will reach out to the patient again over the next 30-60 days.    Care Plan :  General Plan of Care (Adult)  Updates made by [name], RN since 07/27/2021 12:00 AM     Problem: Health Promotion or Disease Self-Management (General Plan of Care)   Priority: Medium  Onset Date: 10/07/2020     Long-Range Goal: Self-Management Plan Developed   Start Date: 10/07/2020  Expected End Date: 08/27/2021  Recent Progress: On track  Priority: Medium  Note:   Current Barriers:  Ineffective Self Health Maintenance Patient needs aide and eyeglasses Currently UNABLE TO independently self manage needs related to chronic health conditions.  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  Patient currently attending PT sessions for [facility] monthly and doing home exercises with improvement-currently wears adult diapers, 4-5 a day and has nocturia several times a night.  Patient states he does have some depression being by himself all of the time-declined referral.  Did discuss opportunities for more social contact-volunteering which patient states he currently does with [employer], exercise.  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 Nurse Case Manager Clinical Goal(s):  patient will work with care management team to address care coordination and chronic disease management needs related to Disease Management Educational Needs Care Coordination Medication Management and Education Medication Reconciliation Medication Assistance  Psychosocial Support   Interventions:  Evaluation of current treatment plan and patient's adherence to plan as established by provider. Reviewed medications with patient. Collaborated with pharmacy regarding referrals. Collaborated with PCP for referrals-completed Collaborated with Care Guide for eyeglass provider that accepts patient's  insurance-completed Care Guide referral for eyeglass provider-completed Discussed plans with patient for ongoing care management follow up and provided patient with direct contact information for care management team Advised patient, providing education and rationale, to monitor blood pressure daily and record, calling provider for findings outside established parameters.  Reviewed scheduled/upcoming provider appointments. Collaborated with PCP for general surgeon and podiatry referral-completed Advised patient, providing education and rationale, to check cbg and record, calling provider for findings outside established parameters.   Pharmacy referral for medication review. Advised patient to contact insurance company regarding resumption of aide service-completed Care Guide referral for first time home buyer resources Collaborated with Care Guide for first time home buyer resources Self Care Activities:  Patient will self administer medications as prescribed Patient will attend all scheduled provider appointments Patient will call pharmacy for medication refills Patient will continue to perform ADL's independently Patient will call provider office for new concerns or questions Patient Goals:. In the next 30 days, patient will attend all appointments. - Follow Up Plan: The patient has been provided with contact information for the care management team and has been advised to call with any health related questions or concerns.  The care management team will reach out to the patient again over the next 30 days.

## 2021-07-27 NOTE — Patient Outreach (Signed)
Medicaid Managed Care   Nurse Care Manager Note  07/27/2021 Name:  Terry Parks MRN:  [id number] DOB:  [birth year]  Terry Parks is an 66 y.o. year old male who is a primary patient of Terry Simmonds, MD.  The [facility] Managed Care Coordination team was consulted for assistance with:    Chronic healthcare management needs, HTN, DM, OSA, chronic pain, CHF, PAF, HLD, CKD  Terry Parks was given information about Medicaid Managed Care Coordination team services today. Terry Parks Patient agreed to services and verbal consent obtained.  Engaged with patient by telephone for follow up visit in response to provider referral for case management and/or care coordination services.   Assessments/Interventions:  Review of past medical history, allergies, medications, health status, including review of consultants reports, laboratory and other test data, was performed as part of comprehensive evaluation and provision of chronic care management services.  SDOH (Social Determinants of Health) assessments and interventions performed: SDOH Interventions    Flowsheet Row Most Recent Value  SDOH Interventions   Food Insecurity Interventions Intervention Not Indicated     Care Plan  Allergies  Allergen Reactions   Lisinopril Cough    Medications Reviewed Today     Reviewed by [name], RN (Registered Nurse) on 07/27/21 at 0935  Med List Status: <None>   Medication Order Taking? Sig Documenting Provider Last Dose Status Informant  Accu-Chek Softclix Lancets lancets [id number] No Check blood sugar three times a day as instructed Terry Severance, MD Taking Active   amiodarone (PACERONE) 200 MG tablet [id number] No Take 1 tablet (200 mg total) by mouth daily. Parks, [name], MD Past Week Active   amLODipine (NORVASC) 5 MG tablet [id number]  TAKE ONE TABLET BY MOUTH EVERY EVENING Parks, [name], MD  Active   atorvastatin (LIPITOR) 40 MG tablet [id number]  TAKE ONE TABLET BY MOUTH EVERY  EVENING Parks, [name], MD  Active   Blood Glucose Monitoring Suppl (ACCU-CHEK GUIDE) w/Device KIT [id number] No 1 Units by Does not apply route 3 (three) times daily. Terry Simmonds, MD Taking Active   carvedilol (COREG) 25 MG tablet [id number]  TAKE ONE TABLET BY MOUTH EVERY MORNING and TAKE ONE TABLET BY MOUTH EVERY EVENING Parks, [name], MD  Active   COMFORT EZ PEN NEEDLES 31G X 6 MM MISC [id number]  USE AS DIRECTED AT BEDTIME Terry Simmonds, MD  Active   COMFORT EZ PEN NEEDLES 32G X 6 MM MISC [id number]   [provider]  Active   DULoxetine (CYMBALTA) 60 MG capsule [id number] No Take 1 capsule (60 mg total) by mouth daily. Terry Severance, MD Past Week Active   empagliflozin (JARDIANCE) 25 MG TABS tablet [id number] No TAKE 1 TABLET (25 MG TOTAL) BY MOUTH DAILY BEFORE BREAKFAST. [name], MD Past Week Active   esomeprazole (NEXIUM) 40 MG capsule [id number]  Take 1 capsule (40 mg total) by mouth 2 (two) times daily before a meal. Mansouraty, Terry Starring., MD  Active   ezetimibe (ZETIA) 10 MG tablet [id number] No Take 1 tablet (10 mg total) by mouth daily. Parks, [name], MD Past Week Expired 07/16/21 2359   ferrous gluconate (FERGON) 324 MG tablet [id number] No Take 1 tablet (324 mg total) by mouth daily with breakfast. Mansouraty, Terry Starring., MD Taking Active   glucose blood (ACCU-CHEK GUIDE) test strip [id number] No Use as instructed to check blood sugar 3 times daily Terry Simmonds, MD Taking Active   Insulin Degludec-Liraglutide (XULTOPHY) 100-3.6 UNIT-MG/ML SOPN [id number] No Inject 52 Units into the skin daily. Terry Knuckles,  [name], MD Past Week Active   rivaroxaban (XARELTO) 20 MG TABS tablet [id number] No Take 1 tablet (20 mg total) by mouth daily with breakfast. Parks, [name], MD Past Week Active   sacubitril-valsartan (ENTRESTO) 97-103 MG [id number]  TAKE ONE TABLET BY MOUTH EVERY MORNING and TAKE ONE TABLET BY MOUTH EVERY EVENING Parks, [name], MD  Active   spironolactone  (ALDACTONE) 50 MG tablet [id number]  TAKE ONE TABLET BY MOUTH EVERY MORNING Parks, [name], MD  Active   sucralfate (CARAFATE) 1 GM/10ML suspension [id number] No Take 10 mLs (1 g total) by mouth 4 (four) times daily. Mansouraty, Terry Starring., MD Past Week Active   torsemide (DEMADEX) 20 MG tablet [id number] No TAKE TWO TABLETS BY MOUTH EVERY MORNING Parks, [name], MD Past Week Active            Patient Active Problem List   Diagnosis Date Noted   Recurrent abdominal hernia without obstruction or gangrene 05/22/2021   Pyrosis 05/22/2021   Indigestion 05/22/2021   Periumbilical pain 05/22/2021   Abdominal pain, epigastric 05/22/2021   Class 3 obesity (HCC) 11/10/2020   S/P TURP 11/10/2020   History of iron deficiency anemia 12/16/2019   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   Depression 01/31/2019   Iron deficiency anemia 04/28/2018   CKD stage 3 due to type 2 diabetes mellitus (HCC) 02/11/2018   Healthcare maintenance 02/11/2018   Persistent atrial fibrillation    Vitamin D deficiency 06/22/2016   Allergic rhinitis 10/19/2012   Diabetic retinopathy (HCC) 10/04/2012   Type 2 diabetes mellitus with peripheral neuropathy (HCC) 04/08/2008   Obstructive sleep apnea    Morbid obesity (HCC)    Hyperlipidemia associated with type 2 diabetes mellitus (HCC)    Hypertension associated with diabetes (HCC)    Chronic combined systolic and diastolic CHF (congestive heart failure) (HCC) 03/01/2006   Conditions to be addressed/monitored per PCP order:  Chronic healthcare management needs, HTN, DM, OSA, chronic pain, CHF, PAF, HLD, CKD, SUI S/P TURP  Care Plan : CCM RN- Diabetes Type 2 (Adult)  Updates made by [name], RN since 07/27/2021 12:00 AM     Problem: Glycemic Management (Diabetes, Type 2)   Priority: High  Onset Date: 06/03/2020     Long-Range Goal: Glycemic Management Optimized   Start Date: 07/20/2019  Expected End Date: 10/23/2021  Recent Progress: Not on track  Priority:  High  Note:    CARE PLAN ENTRY (see longitudinal plan of care for additional care plan information)  Current Barriers:  Knowledge Deficits related to basic Diabetes pathophysiology and self care/management Knowledge Deficits related to medications used for management of diabetes 07/27/21  Hgb A1C= 7.4 on 04/15/21.  Continues to check blood sugars daily, goal < 150.  Case Manager Clinical Goal(s):  Over the next 30-60  days, patient will demonstrate improved adherence to prescribed treatment plan for diabetes self care/management as evidenced by:  daily monitoring and recording of CBG  adherence to ADA/ carb modified diet adherence to prescribed medication regimen  Interventions:  Reviewed education with patient about basic DM disease process Reviewed medications and assessed medication taking behavior Ensured patient has ample supply of all prescribed medications Advised patient, providing education and rationale, to check CBG at least daily and record, calling provider and/or CCM RN for findings outside established parameters. Educated patient that leg and foot pain will likely decrease if blood sugar control is improved and a CGM could provide that assistance  Discussed plans with patient for ongoing care management  follow up and provided patient with direct contact information for care management team Review of patient status, including review of consultants reports, relevant laboratory and other test results, and medications completed.  Patient Self Care Activities:  UNABLE to independently self manage DM Self administers oral medications as prescribed Self administers insulin as prescribed Self administers injectable DM medication Xultophy as prescribed Attends all scheduled provider appointments Checks blood sugars as prescribed and utilize hyper and hypoglycemia protocol as needed Adheres to prescribed ADA/carb modified check blood sugar at prescribed times - check blood  sugar if I feel it is too high or too low - enter blood sugar readings and medication or insulin into my phone log - take the blood sugar log to all doctor visits - take the blood sugar meter to all doctor vis   Care Plan : CCM RN- Chronic Pain (Adult)- lower extremeties  Updates made by [name], RN since 07/27/2021 12:00 AM     Problem: Chronic Pain Management (Chronic Pain)   Priority: High  Onset Date: 07/15/2020     Long-Range Goal: Chronic Pain Managed   Start Date: 07/15/2020  Expected End Date: 09/26/2021  Recent Progress: On track  Priority: High  Note:   Current Barriers:  Knowledge Deficits related to self-health management of chronic lower extremity pain Chronic Disease Management support and education needs related to chronic pain 07/27/21:  No change in abdominal pain, back and foot pain managed.  To schedule an appt. With general surgeon for hernia Clinical Goal(s):  patient will verbalize understanding of plan for pain management.  and patient will use pharmacological and nonpharmacological pain relief strategies as prescribed.  Interventions:  Pain assessment performed Medications reviewed Discussed plans with patient for ongoing care management follow up and provided patient with direct contact information for care management team Discussed improvement  in DM control will likely result in less pain in legs and feet  Patient Goals/Self Care Activities:  Will self-administer medications as prescribed Will attend all scheduled provider appointments Will call pharmacy for medication refills 7 days prior to needed refill date Patient will calls provider office for new concerns or questions Follow Up Plan: The care management team will reach out to the patient again over the next 30-60 days.    Care Plan : General Plan of Care (Adult)  Updates made by [name], RN since 07/27/2021 12:00 AM     Problem: Health Promotion or Disease Self-Management (General Plan  of Care)   Priority: Medium  Onset Date: 10/07/2020     Long-Range Goal: Self-Management Plan Developed   Start Date: 10/07/2020  Expected End Date: 08/27/2021  Recent Progress: On track  Priority: Medium  Note:   Current Barriers:  Ineffective Self Health Maintenance Patient needs aide and eyeglasses Currently UNABLE TO independently self manage needs related to chronic health conditions.  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  Patient currently attending PT sessions for SUI monthly and doing home exercises with improvement-currently wears adult diapers, 4-5 a day and has nocturia several times a night.  Patient states he does have some depression being by himself all of the time-declined referral.  Did discuss opportunities for more social contact-volunteering which patient states he currently does with [employer], exercise.  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 Nurse  Case Manager Clinical Goal(s):  patient will work with care management team to address care coordination and chronic disease management needs related to Disease Management Educational Needs Care Coordination Medication Management and Education Medication Reconciliation Medication Assistance  Psychosocial Support   Interventions:  Evaluation of current treatment plan and patient's adherence to plan as established by provider. Reviewed medications with patient. Collaborated with pharmacy regarding referrals. Collaborated with PCP for referrals-completed Collaborated with Care Guide for eyeglass provider that accepts patient's insurance-completed Care Guide referral for eyeglass provider-completed Discussed plans with patient for ongoing care management follow up and provided patient with direct contact  information for care management team Advised patient, providing education and rationale, to monitor blood pressure daily and record, calling provider for findings outside established parameters.  Reviewed scheduled/upcoming provider appointments. Collaborated with PCP for general surgeon and podiatry referral-completed Advised patient, providing education and rationale, to check cbg and record, calling provider for findings outside established parameters.   Pharmacy referral for medication review. Advised patient to contact insurance company regarding resumption of aide service-completed Care Guide referral for first time home buyer resources Collaborated with Care Guide for first time home buyer resources Self Care Activities:  Patient will self administer medications as prescribed Patient will attend all scheduled provider appointments Patient will call pharmacy for medication refills Patient will continue to perform ADL's independently Patient will call provider office for new concerns or questions Patient Goals:. In the next 30 days, patient will attend all appointments. - Follow Up Plan: The patient has been provided with contact information for the care management team and has been advised to call with any health related questions or concerns.  The care management team will reach out to the patient again over the next 30 days.     Follow Up:  Patient agrees to Care Plan and Follow-up.  Plan: The Managed Medicaid care management team will reach out to the patient again over the next 30 days. and The  Patient has been provided with contact information for the Managed Medicaid care management team and has been advised to call with any health related questions or concerns.  Date/time of next scheduled RN care management/care coordination outreach:  08/27/21 at 0900

## 2021-07-28 ENCOUNTER — Telehealth: Payer: Self-pay

## 2021-07-28 NOTE — Telephone Encounter (Signed)
? ?  Telephone encounter was:  Successful.  ?07/28/2021 ?Name: Terry Parks MRN: [id number] DOB: June 12, 1955 ? ?Terry Parks is a 66 y.o. year old male who is a primary care patient of [name], MD . The community resource team was consulted for assistance with  housing ? ?Care guide performed the following interventions: Patient provided with information about care guide support team and interviewed to confirm resource needs.Patient requested infomation about first time home buyer program. I gave him a few esources over the phone but will mail a few as well ? ?Follow Up Plan:  Care guide will follow up with patient by phone over the next two weeks ? ? ? ?Larena Sox ?Care Guide, Embedded Care Coordination ?[place], Care Management  ?[phone] ?[address], [place], [place] [address] ?Phone: [phone] ?Email: [email or web address] ? ?  ?

## 2021-07-28 NOTE — Telephone Encounter (Signed)
?  Mailing Resouces  ?

## 2021-08-11 DIAGNOSIS — I10 Essential (primary) hypertension: Secondary | ICD-10-CM | POA: Diagnosis not present

## 2021-08-11 DIAGNOSIS — R32 Unspecified urinary incontinence: Secondary | ICD-10-CM | POA: Diagnosis not present

## 2021-08-11 DIAGNOSIS — E119 Type 2 diabetes mellitus without complications: Secondary | ICD-10-CM | POA: Diagnosis not present

## 2021-08-19 ENCOUNTER — Other Ambulatory Visit: Payer: Self-pay | Admitting: Pharmacist

## 2021-08-19 ENCOUNTER — Other Ambulatory Visit: Payer: Self-pay | Admitting: Student

## 2021-08-19 ENCOUNTER — Other Ambulatory Visit (HOSPITAL_COMMUNITY): Payer: Self-pay

## 2021-08-19 MED ORDER — ACCU-CHEK GUIDE VI STRP
ORAL_STRIP | Freq: Three times a day (TID) | 3 refills | Status: DC
  Filled 2021-08-19: qty 100, 30d supply, fill #0

## 2021-08-27 ENCOUNTER — Other Ambulatory Visit: Payer: Self-pay | Admitting: Obstetrics and Gynecology

## 2021-08-27 NOTE — Patient Instructions (Signed)
Hi Terry Parks, great to speak with you this morning, I hope you have a great day! ? ?Terry Parks was given information about Medicaid Managed Care team care coordination services as a part of their [place] Medicaid benefit. Terry Parks verbally consented to engagement with the [facility] Managed Care team.  ? ?If you are experiencing a medical emergency, please call 911 or report to your local emergency department or urgent care.  ? ?If you have a non-emergency medical problem during routine business hours, please contact your provider's office and ask to speak with a nurse.  ? ?For questions related to your [facility], please call: [phone] or visit the homepage here: [email or web address] ? ?If you would like to schedule transportation through your [facility], please call the following number at least 2 days in advance of your appointment: [phone]. ? Rides for urgent appointments can also be made after hours by calling Member Services. ? ?Call the [facility] at [phone], at any time, 24 hours a day, 7 days a week. If you are in danger or need immediate medical attention call 911. ? ?If you would like help to quit smoking, call 1-800-QUIT-NOW 816-053-4363) OR Espa?ol: 1-855-D?jelo-Ya (520)743-9542) o para m?s informaci?n haga clic aqu? or Text READY to 200-400 to register via text ? ?Terry Parks - following are the goals we discussed in your visit today:  ? Goals Addressed   ? ?  ?  ?  ?  ? This Visit's Progress  ?  Monitor and Manage My Blood Sugar-Diabetes Type 2     ?  Timeframe:  Long-Range Goal ?Priority:  High ?Start Date:        06/03/20                     ?Expected End Date:   ongoing              ? ?Follow Up Date:  09/29/21 ?  ?- check blood sugar at prescribed times ?- check blood sugar if I feel it is too high or too low ?- enter blood  sugar readings and medication or insulin into my phone log ?- take the blood sugar log to all doctor visits ?- take the blood sugar meter to all doctor visits  ?  ?Why is this important?   ?Checking your blood sugar at home helps to keep it from getting very high or very low.  ?Writing the results in a diary or log helps the doctor know how to care for you.  ?Your blood sugar log should have the time, date and the results.  ?Also, write down the amount of insulin or other medicine that you take.  ?Other information, like what you ate, exercise done and how you were feeling, will also be helpful.   ?  ?Notes:-  ?08/27/21:  Patient checks blood sugar daily, < 150, per patient ?  ?  Protect My Health     ?  Timeframe:  Long-Range Goal ?Priority:  Medium ?Start Date:      10/07/20                       ?Expected End Date:  ongoing               ? ?Follow Up Date:  09/29/21 ?  ?- schedule appointment for flu shot ?- schedule appointment for vaccines needed due to my age  or health ?- schedule recommended health tests. ?- schedule and keep appointment for annual check-up  ? ?Why is this important?   ?Screening tests can find diseases early when they are easier to treat.  ?Your doctor or nurse will talk with you about which tests are important for you.  ?Getting shots for common diseases like the flu and shingles will help prevent them.   ?08/27/21:  patient to continue PT appts-to get referral for general surgeon regarding hernia.  To check on getting an aide and handicap placard.  ?  Track and Manage Fluids and Swelling-Heart Failure     ?  Timeframe:  Long-Range Goal ?Priority:  High ?Start Date:          01/14/20                   ?Expected End Date:   ongoing    ? ?Follow Up Date: 09/29/21 ?  ?- call office if I gain more than 2 pounds in one day or 5 pounds in one week ?- do ankle pumps when sitting ?- use salt in moderation ?- watch for swelling in feet, ankles and legs every day ?- weigh myself daily  ?- log weight in my  phone ?  ?Why is this important?   ?It is important to check your weight daily and watch how much salt and liquids you have.  ?It will help you to manage your heart failure.  ?  ?Notes: ?08/27/21:  No change in weight per patient  ?  Track and Manage My Blood Pressure-Hypertension     ?  Timeframe:  Long-Range Goal ?Priority:  High ?Start Date:     06/03/20                        ?Expected End Date:  ongoing                ? ?Follow Up Date: 09/29/21 ? ?- check blood pressure daily ?- write blood pressure results in my phone log  ?  ?Why is this important?   ?You won't feel high blood pressure, but it can still hurt your blood vessels.  ?High blood pressure can cause heart or kidney problems. It can also cause a stroke.  ?Making lifestyle changes like losing a little weight or eating less salt will help.  ?Checking your blood pressure at home and at different times of the day can help to control blood pressure.  ?If the doctor prescribes medicine remember to take it the way the doctor ordered.  ?Call the office if you cannot afford the medicine or if there are questions about it ?08/27/21:  BP = 120/76  ? ?Patient verbalizes understanding of instructions and care plan provided today and agrees to view in [facility]. Active MyChart status confirmed with patient.   ? ?The Managed Medicaid care management team will reach out to the patient again over the next 30 days.  ?The  Patient  has been provided with contact information for the Managed Medicaid care management team and has been advised to call with any health related questions or concerns.  ? ?[name] RN, BSN ?[facility] ?Care Management Coordinator - Managed Medicaid High Risk ?[phone] ?  ?Following is a copy of your plan of care:  ?Care Plan : CCM RN- Diabetes Type 2 (Adult)  ?Updates made by Terry Medicus, RN since 08/27/2021 12:00 AM  ?  ? ?Problem: Glycemic  Management (Diabetes, Type 2)   ?Priority: High  ?Onset Date: 06/03/2020   ?  ? ?Long-Range Goal: Glycemic Management Optimized   ?Start Date: 07/20/2019  ?Expected End Date: 10/23/2021  ?Recent Progress: Not on track  ?Priority: High  ?Note:   ? ?CARE PLAN ENTRY ?(see longitudinal plan of care for additional care plan information) ? ?Current Barriers:  ?Knowledge Deficits related to basic Diabetes pathophysiology and self care/management ?Knowledge Deficits related to medications used for management of diabetes ?08/27/21:  Hgb A1C= 7.4 on 04/15/21.  Continues to check blood sugars daily, goal < 150, per patient ? ?Case Manager Clinical Goal(s):  ?Over the next 30-60  days, patient will demonstrate improved adherence to prescribed treatment plan for diabetes self care/management as evidenced by:  ?daily monitoring and recording of CBG  ?adherence to ADA/ carb modified diet ?adherence to prescribed medication regimen ? ?Interventions:  ?Reviewed education with patient about basic DM disease process ?Reviewed medications and assessed medication taking behavior ?Ensured patient has ample supply of all prescribed medications ?Advised patient, providing education and rationale, to check CBG at least daily and record, calling provider and/or CCM RN for findings outside established parameters. ?Educated patient that leg and foot pain will likely decrease if blood sugar control is improved and a CGM could provide that assistance  ?Discussed plans with patient for ongoing care management follow up and provided patient with direct contact information for care management team ?Review of patient status, including review of consultants reports, relevant laboratory and other test results, and medications completed. ? ?Patient Self Care Activities:  ?UNABLE to independently self manage DM ?Self administers oral medications as prescribed ?Self administers insulin as prescribed ?Self administers injectable DM medication Xultophy as prescribed ?Attends all scheduled provider appointments ?Checks blood sugars as  prescribed and utilize hyper and hypoglycemia protocol as needed ?Adheres to prescribed ADA/carb modified ?check blood sugar at prescribed times ?- check blood sugar if I feel it is too high or too low

## 2021-08-27 NOTE — Patient Outreach (Signed)
Medicaid Managed Care   Nurse Care Manager Note  08/27/2021 Name:  Terry Parks MRN:  [id number] DOB:  1955-07-17  Terry Parks is an 66 y.o. year old male who is a primary patient of [name], MD.  The [facility] Managed Care Coordination team was consulted for assistance with:    Chronic healthcare management needs, HTN, DM, OSA, chronic pain, CHF, PAF, h/o prostate cancer, HLD, CKD  Mr. Varghese was given information about Medicaid Managed Care Coordination team services today. [name] Patient agreed to services and verbal consent obtained.  Engaged with patient by telephone for follow up visit in response to provider referral for case management and/or care coordination services.   Assessments/Interventions:  Review of past medical history, allergies, medications, health status, including review of consultants reports, laboratory and other test data, was performed as part of comprehensive evaluation and provision of chronic care management services.  SDOH (Social Determinants of Health) assessments and interventions performed: SDOH Interventions    Flowsheet Row Most Recent Value  SDOH Interventions   Financial Strain Interventions Intervention Not Indicated  Stress Interventions Intervention Not Indicated     Care Plan  Allergies  Allergen Reactions   Lisinopril Cough   Medications Reviewed Today     Reviewed by [name], RN (Registered Nurse) on 08/27/21 at [phone]  Med List Status: <None>   Medication Order Taking? Sig Documenting Provider Last Dose Status Informant  Accu-Chek Softclix Lancets lancets [id number] No Check blood sugar three times a day as instructed [name], MD Taking Active   amiodarone (PACERONE) 200 MG tablet [id number] No Take 1 tablet (200 mg total) by mouth daily. [name], [name], MD Past Week Active   amLODipine (NORVASC) 5 MG tablet [id number]  TAKE ONE TABLET BY MOUTH EVERY EVENING [name], [name], MD  Active    atorvastatin (LIPITOR) 40 MG tablet [id number]  TAKE ONE TABLET BY MOUTH EVERY EVENING [name], [name], MD  Active   Blood Glucose Monitoring Suppl (ACCU-CHEK GUIDE) w/Device KIT [id number] No 1 Units by Does not apply route 3 (three) times daily. [name], MD Taking Active   carvedilol (COREG) 25 MG tablet [id number]  TAKE ONE TABLET BY MOUTH EVERY MORNING and TAKE ONE TABLET BY MOUTH EVERY EVENING [name], [name], MD  Active   COMFORT EZ PEN NEEDLES 31G X 6 MM MISC [id number]  USE AS DIRECTED AT BEDTIME [name], MD  Active   COMFORT EZ PEN NEEDLES 32G X 6 MM MISC [id number]   [provider]  Active   DULoxetine (CYMBALTA) 60 MG capsule [id number] No Take 1 capsule (60 mg total) by mouth daily. [name], MD Past Week Active   empagliflozin (JARDIANCE) 25 MG TABS tablet [id number] No TAKE 1 TABLET (25 MG TOTAL) BY MOUTH DAILY BEFORE BREAKFAST. [name], MD Past Week Active   esomeprazole (NEXIUM) 40 MG capsule [id number]  Take 1 capsule (40 mg total) by mouth 2 (two) times daily before a meal. Mansouraty, [name], MD  Active   ezetimibe (ZETIA) 10 MG tablet [id number] No Take 1 tablet (10 mg total) by mouth daily. [name], [name], MD Past Week Expired 07/16/21 2359   ferrous gluconate (FERGON) 324 MG tablet [id number] No Take 1 tablet (324 mg total) by mouth daily with breakfast. Mansouraty, [name], MD Taking Active   glucose blood (ACCU-CHEK GUIDE) test strip [id number] No Use as instructed to check blood sugar 3 times daily [name], MD Taking Active   glucose blood (ACCU-CHEK GUIDE) test strip [id number]  Test 3 times daily [name], MD  Active   Insulin Degludec-Liraglutide (XULTOPHY) 100-3.6 UNIT-MG/ML SOPN [id number] No Inject 52 Units into the skin daily. [name], MD Past Week Active   rivaroxaban (XARELTO) 20 MG TABS tablet [id number] No Take 1 tablet (20 mg total) by mouth daily with breakfast. [name], [name], MD Past Week Active    sacubitril-valsartan (ENTRESTO) 97-103 MG [id number]  TAKE ONE TABLET BY MOUTH EVERY MORNING and TAKE ONE TABLET BY MOUTH EVERY EVENING [name], [name], MD  Active   spironolactone (ALDACTONE) 50 MG tablet [id number]  TAKE ONE TABLET BY MOUTH EVERY MORNING [name], [name], MD  Active   sucralfate (CARAFATE) 1 GM/10ML suspension [id number] No Take 10 mLs (1 g total) by mouth 4 (four) times daily. Mansouraty, [name], MD Past Week Active   torsemide (DEMADEX) 20 MG tablet [id number] No TAKE TWO TABLETS BY MOUTH EVERY MORNING [name], [name], MD Past Week Active            Patient Active Problem List   Diagnosis Date Noted   Recurrent abdominal hernia without obstruction or gangrene 05/22/2021   Pyrosis 05/22/2021   Indigestion 05/22/2021   Periumbilical pain 05/22/2021   Abdominal pain, epigastric 05/22/2021   Class 3 obesity (HCC) 11/10/2020   S/P TURP 11/10/2020   History of iron deficiency anemia 12/16/2019   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   Depression 01/31/2019   Iron deficiency anemia 04/28/2018   CKD stage 3 due to type 2 diabetes mellitus (HCC) 02/11/2018   Healthcare maintenance 02/11/2018   Persistent atrial fibrillation    Vitamin D deficiency 06/22/2016   Allergic rhinitis 10/19/2012   Diabetic retinopathy (HCC) 10/04/2012   Type 2 diabetes mellitus with peripheral neuropathy (HCC) 04/08/2008   Obstructive sleep apnea    Morbid obesity (HCC)    Hyperlipidemia associated with type 2 diabetes mellitus (HCC)    Hypertension associated with diabetes (HCC)    Chronic combined systolic and diastolic CHF (congestive heart failure) (HCC) 03/01/2006   Conditions to be addressed/monitored per PCP order:  Chronic healthcare management needs, HTN, DM, OSA, chronic pain, CHF, PAF, h/o prostate cancer, HLD, CKD  Care Plan : CCM RN- Heart Failure (Adult), HTN, PAF. CKD  Updates made by [name], RN since 08/27/2021 12:00 AM     Problem: Symptom Exacerbation (Heart  Failure), PAF, HTN Resolved 08/27/2021  Priority: High  Onset Date: 06/03/2020     Care Plan : CCM RN- Diabetes Type 2 (Adult)  Updates made by [name], RN since 08/27/2021 12:00 AM     Problem: Glycemic Management (Diabetes, Type 2)   Priority: High  Onset Date: 06/03/2020     Long-Range Goal: Glycemic Management Optimized   Start Date: 07/20/2019  Expected End Date: 10/23/2021  Recent Progress: Not on track  Priority: High  Note:    CARE PLAN ENTRY (see longitudinal plan of care for additional care plan information)  Current Barriers:  Knowledge Deficits related to basic Diabetes pathophysiology and self care/management Knowledge Deficits related to medications used for management of diabetes 08/27/21:  Hgb A1C= 7.4 on 04/15/21.  Continues to check blood sugars daily, goal < 150, per patient  Case Manager Clinical Goal(s):  Over the next 30-60  days, patient will demonstrate improved adherence to prescribed treatment plan for diabetes self care/management as evidenced by:  daily monitoring and recording of CBG  adherence to ADA/ carb modified diet adherence to prescribed medication regimen  Interventions:  Reviewed education with patient about  basic DM disease process Reviewed medications and assessed medication taking behavior Ensured patient has ample supply of all prescribed medications Advised patient, providing education and rationale, to check CBG at least daily and record, calling provider and/or CCM RN for findings outside established parameters. Educated patient that leg and foot pain will likely decrease if blood sugar control is improved and a CGM could provide that assistance  Discussed plans with patient for ongoing care management follow up and provided patient with direct contact information for care management team Review of patient status, including review of consultants reports, relevant laboratory and other test results, and medications  completed.  Patient Self Care Activities:  UNABLE to independently self manage DM Self administers oral medications as prescribed Self administers insulin as prescribed Self administers injectable DM medication Xultophy as prescribed Attends all scheduled provider appointments Checks blood sugars as prescribed and utilize hyper and hypoglycemia protocol as needed Adheres to prescribed ADA/carb modified check blood sugar at prescribed times - check blood sugar if I feel it is too high or too low - enter blood sugar readings and medication or insulin into my phone log - take the blood sugar log to all doctor visits - take the blood sugar meter to all doctor visits   Care Plan : CCM RN- Chronic Pain (Adult)- lower extremeties  Updates made by [name], RN since 08/27/2021 12:00 AM     Problem: Chronic Pain Management (Chronic Pain)   Priority: High  Onset Date: 07/15/2020     Long-Range Goal: Chronic Pain Managed   Start Date: 07/15/2020  Expected End Date: 12/27/2021  Recent Progress: On track  Priority: High  Note:   Current Barriers:  Knowledge Deficits related to self-health management of chronic lower extremity pain Chronic Disease Management support and education needs related to chronic pain 08/27/21:  No complaints of abdominal pain today-book and foot pain ongoing.  Patient to get referral to surgeon for hernia. Clinical Goal(s):  patient will verbalize understanding of plan for pain management.  and patient will use pharmacological and nonpharmacological pain relief strategies as prescribed.  Interventions:  Pain assessment performed Medications reviewed Discussed plans with patient for ongoing care management follow up and provided patient with direct contact information for care management team Discussed improvement  in DM control will likely result in less pain in legs and feet  Patient Goals/Self Care Activities:  Will self-administer medications as  prescribed Will attend all scheduled provider appointments Will call pharmacy for medication refills 7 days prior to needed refill date Patient will calls provider office for new concerns or questions Patient will get appt scheduled with general surgeon Follow Up Plan: The care management team will reach out to the patient again over the next 30-60 days.    Care Plan : General Plan of Care (Adult)  Updates made by [name], RN since 08/27/2021 12:00 AM     Problem: Health Promotion or Disease Self-Management (General Plan of Care)   Priority: Medium  Onset Date: 10/07/2020     Long-Range Goal: Self-Management Plan Developed   Start Date: 10/07/2020  Expected End Date: 11/26/2021  Recent Progress: On track  Priority: Medium  Note:   Current Barriers:  Ineffective Self Health Maintenance Patient needs aide and eyeglasses Currently UNABLE TO independently self manage needs related to chronic health conditions.  Knowledge Deficits related to short term plan for care coordination needs and long term plans for chronic disease management needs 07/27/21:  Patient in need of resources  for first time home buyers-will refer.  Patient currently attending PT sessions for [facility] monthly and doing home exercises with improvement-currently wears adult diapers, 4-5 a day and has nocturia several times a night.  Patient states he does have some depression being by himself all of the time-declined referral.  Did discuss opportunities for more social contact-volunteering which patient states he currently does with [employer], exercise.  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 08/27/21:  Patient has received resources.  No complaints today-states he is doing well. Nurse Case Manager Clinical Goal(s):  patient will work with care management team to address care coordination and chronic disease management needs related to Disease Management Educational Needs Care  Coordination Medication Management and Education Medication Reconciliation Medication Assistance  Psychosocial Support   Interventions:  Evaluation of current treatment plan and patient's adherence to plan as established by provider. Reviewed medications with patient. Collaborated with pharmacy regarding referrals. Collaborated with PCP for referrals-completed Collaborated with Care Guide for eyeglass provider that accepts patient's insurance-completed Care Guide referral for eyeglass provider-completed Discussed plans with patient for ongoing care management follow up and provided patient with direct contact information for care management team Advised patient, providing education and rationale, to monitor blood pressure daily and record, calling provider for findings outside established parameters.  Reviewed scheduled/upcoming provider appointments. Collaborated with PCP for general surgeon and podiatry referral-completed Advised patient, providing education and rationale, to check cbg and record, calling provider for findings outside established parameters.   Pharmacy referral for medication review. Advised patient to contact insurance company regarding resumption of aide service-completed Care Guide referral for first time home buyer resources-completed Collaborated with Care Guide for first time home buyer resources Self Care Activities:  Patient will self administer medications as prescribed Patient will attend all scheduled provider appointments Patient will call pharmacy for medication refills Patient will continue to perform ADL's independently Patient will call provider office for new concerns or questions Patient Goals:. In the next 30 days, patient will attend all appointments. - Follow Up Plan: The patient has been provided with contact information for the care management team and has been advised to call with any health related questions or concerns.  The care management  team will reach out to the patient again over the next 30 days.     Follow Up:  Patient agrees to Care Plan and Follow-up.  Plan: The Managed Medicaid care management team will reach out to the patient again over the next 30 days. and The  Patient has been provided with contact information for the Managed Medicaid care management team and has been advised to call with any health related questions or concerns.  Date/time of next scheduled RN care management/care coordination outreach: 09/30/21 at 1230.

## 2021-09-01 ENCOUNTER — Telehealth: Payer: Self-pay

## 2021-09-01 NOTE — Telephone Encounter (Signed)
? ?  Telephone encounter was:  Successful.  ?09/01/2021 ?Name: Terry Parks MRN: [id number] DOB: 17-Dec-1955 ? ?Terry Parks is a 66 y.o. year old male who is a primary care patient of [name], MD . The community resource team was consulted for assistance with  HOUSING ? ?Care guide performed the following interventions: Patient provided with information about care guide support team and interviewed to confirm resource needs.follow up for mailed resources patient did receive the resources ? ?Follow Up Plan:  No further follow up planned at this time. The patient has been provided with needed resources. ? ? ?Larena Sox ?Care Guide, Embedded Care Coordination ?[place], Care Management  ?[phone] ?[address], [place], [place] [address] ?Phone: [phone] ?Email: [email or web address] ? ?  ?

## 2021-09-05 ENCOUNTER — Encounter (HOSPITAL_BASED_OUTPATIENT_CLINIC_OR_DEPARTMENT_OTHER): Payer: Self-pay | Admitting: Emergency Medicine

## 2021-09-05 ENCOUNTER — Other Ambulatory Visit: Payer: Self-pay

## 2021-09-05 ENCOUNTER — Emergency Department (HOSPITAL_BASED_OUTPATIENT_CLINIC_OR_DEPARTMENT_OTHER)
Admission: EM | Admit: 2021-09-05 | Discharge: 2021-09-05 | Discharge status: Against Medical Advice | Payer: Medicaid Other | Attending: Emergency Medicine | Admitting: Emergency Medicine

## 2021-09-05 DIAGNOSIS — M25429 Effusion, unspecified elbow: Secondary | ICD-10-CM | POA: Diagnosis not present

## 2021-09-05 DIAGNOSIS — Z5321 Procedure and treatment not carried out due to patient leaving prior to being seen by health care provider: Secondary | ICD-10-CM | POA: Diagnosis not present

## 2021-09-05 DIAGNOSIS — M25529 Pain in unspecified elbow: Secondary | ICD-10-CM | POA: Insufficient documentation

## 2021-09-05 NOTE — ED Triage Notes (Signed)
Pt c/o elbow pain for 1 week, along with heat and swelling.   ?

## 2021-09-05 NOTE — ED Notes (Signed)
In to check on patient to find room empty with no personal belongings left behind. Patient not found in department.  ?

## 2021-09-10 ENCOUNTER — Telehealth: Payer: Self-pay

## 2021-09-10 NOTE — Telephone Encounter (Signed)
Suanne Marker from [place] [facility] called she stated she received a fax and listed was [facility] [place], Suanne Marker wants to know if the patient will be seen at their office or [place] [facility] call back:[phone] ?[phone] ?

## 2021-09-16 DIAGNOSIS — E119 Type 2 diabetes mellitus without complications: Secondary | ICD-10-CM | POA: Diagnosis not present

## 2021-09-16 DIAGNOSIS — R32 Unspecified urinary incontinence: Secondary | ICD-10-CM | POA: Diagnosis not present

## 2021-09-16 DIAGNOSIS — I10 Essential (primary) hypertension: Secondary | ICD-10-CM | POA: Diagnosis not present

## 2021-09-18 ENCOUNTER — Telehealth: Payer: Self-pay | Admitting: Student

## 2021-09-18 NOTE — Telephone Encounter (Signed)
Patient calling wanting a new referral to another doctor in reference to his hernia. Informed patient that he may need an appointment first. Forwarding to PCP and referral coordinator, Enedina Finner. ?

## 2021-09-22 ENCOUNTER — Telehealth: Payer: Self-pay | Admitting: Cardiovascular Disease

## 2021-09-22 NOTE — Telephone Encounter (Signed)
Please have him take double his usual torsemide dose (4x20 mg instead of 2x20 mg) daily until his weight is under 300 lb. Then  he can go back to taking it as usual. ?Please confirm that he is now taking all his meds as prescribed (including spironolactone, Entresto, Jardiance). ?He should get an office appt when we can check labs, please. ?

## 2021-09-22 NOTE — Telephone Encounter (Signed)
New Message: ? ? ? ?Patient says he have been wheezing. He said [name] told him when he started wheezing to be sure to call the office. I asked him did he have any other symptoms, he said he did not at this time. ?

## 2021-09-22 NOTE — Telephone Encounter (Signed)
The patient is complaining of wheezing for about 3 days, also coughing up phlegm, some clear some greenish. No shortness of breath.  ?Weight 306 pounds. VS 110/68 69. Patient states his feet and legs are swollen, he says they stay this way all the time. Patient stated he can also not get his ring on his finger for about 2 weeks. The patient stated his missed his medications yesterday 09/21/21. Patient stated his probably missed his medications 3 times last week as well. No changes in diet or lifestyle. Will forward to MD for advisement. ED precautions given.  ?Patient verbalized understanding.   ?

## 2021-09-23 NOTE — Telephone Encounter (Signed)
Spoke to the patient. He stated that he was doing better and refuses to increase nor take anymore medications. He stated that he is on too much now and will not do anymore. He was been educated on the purpose of his medications but still refuses.  ? ?He feels like it could be allergy related and will call his PCP. He used to be on albuterol that used to help.  ? ?Appointment made for 5/17 at 10 am.  ?

## 2021-09-28 DIAGNOSIS — C61 Malignant neoplasm of prostate: Secondary | ICD-10-CM | POA: Diagnosis not present

## 2021-09-28 DIAGNOSIS — N5231 Erectile dysfunction following radical prostatectomy: Secondary | ICD-10-CM | POA: Diagnosis not present

## 2021-09-28 DIAGNOSIS — N3941 Urge incontinence: Secondary | ICD-10-CM | POA: Diagnosis not present

## 2021-09-28 DIAGNOSIS — N393 Stress incontinence (female) (male): Secondary | ICD-10-CM | POA: Diagnosis not present

## 2021-09-29 DIAGNOSIS — H35372 Puckering of macula, left eye: Secondary | ICD-10-CM | POA: Diagnosis not present

## 2021-09-29 DIAGNOSIS — E113392 Type 2 diabetes mellitus with moderate nonproliferative diabetic retinopathy without macular edema, left eye: Secondary | ICD-10-CM | POA: Diagnosis not present

## 2021-09-29 DIAGNOSIS — H25813 Combined forms of age-related cataract, bilateral: Secondary | ICD-10-CM | POA: Diagnosis not present

## 2021-09-30 ENCOUNTER — Other Ambulatory Visit: Payer: Self-pay | Admitting: Obstetrics and Gynecology

## 2021-09-30 ENCOUNTER — Encounter: Payer: Self-pay | Admitting: Cardiovascular Disease

## 2021-09-30 ENCOUNTER — Ambulatory Visit (INDEPENDENT_AMBULATORY_CARE_PROVIDER_SITE_OTHER): Payer: Medicaid Other | Admitting: Cardiovascular Disease

## 2021-09-30 VITALS — BP 122/72 | HR 71 | Ht 73.0 in | Wt 305.6 lb

## 2021-09-30 DIAGNOSIS — I480 Paroxysmal atrial fibrillation: Secondary | ICD-10-CM

## 2021-09-30 DIAGNOSIS — Z79899 Other long term (current) drug therapy: Secondary | ICD-10-CM | POA: Diagnosis not present

## 2021-09-30 DIAGNOSIS — E1142 Type 2 diabetes mellitus with diabetic polyneuropathy: Secondary | ICD-10-CM

## 2021-09-30 DIAGNOSIS — I5042 Chronic combined systolic (congestive) and diastolic (congestive) heart failure: Secondary | ICD-10-CM

## 2021-09-30 DIAGNOSIS — N1831 Chronic kidney disease, stage 3a: Secondary | ICD-10-CM

## 2021-09-30 DIAGNOSIS — I10 Essential (primary) hypertension: Secondary | ICD-10-CM | POA: Diagnosis not present

## 2021-09-30 DIAGNOSIS — E785 Hyperlipidemia, unspecified: Secondary | ICD-10-CM

## 2021-09-30 DIAGNOSIS — G4733 Obstructive sleep apnea (adult) (pediatric): Secondary | ICD-10-CM

## 2021-09-30 DIAGNOSIS — E1169 Type 2 diabetes mellitus with other specified complication: Secondary | ICD-10-CM

## 2021-09-30 DIAGNOSIS — D6869 Other thrombophilia: Secondary | ICD-10-CM | POA: Diagnosis not present

## 2021-09-30 DIAGNOSIS — Z5181 Encounter for therapeutic drug level monitoring: Secondary | ICD-10-CM

## 2021-09-30 DIAGNOSIS — I428 Other cardiomyopathies: Secondary | ICD-10-CM

## 2021-09-30 LAB — LIPID PANEL
Chol/HDL Ratio: 4 ratio (ref 0.0–5.0)
Cholesterol, Total: 128 mg/dL (ref 100–199)
HDL: 32 mg/dL — ABNORMAL LOW (ref >39)
LDL Chol Calc (NIH): 75 mg/dL (ref 0–99)
Triglycerides: 114 mg/dL (ref 0–149)
VLDL Cholesterol Cal: 21 mg/dL (ref 5–40)

## 2021-09-30 LAB — TSH: TSH: 0.585 u[IU]/mL (ref 0.450–4.500)

## 2021-09-30 LAB — COMPREHENSIVE METABOLIC PANEL
ALT: 14 IU/L (ref 0–44)
AST: 16 IU/L (ref 0–40)
Albumin/Globulin Ratio: 1.2 (ref 1.2–2.2)
Albumin: 4.1 g/dL (ref 3.8–4.8)
Alkaline Phosphatase: 87 IU/L (ref 44–121)
BUN/Creatinine Ratio: 11 (ref 10–24)
BUN: 19 mg/dL (ref 8–27)
Bilirubin Total: 0.5 mg/dL (ref 0.0–1.2)
CO2: 25 mmol/L (ref 20–29)
Calcium: 8.4 mg/dL — ABNORMAL LOW (ref 8.6–10.2)
Chloride: 99 mmol/L (ref 96–106)
Creatinine, Ser: 1.77 mg/dL — ABNORMAL HIGH (ref 0.76–1.27)
Globulin, Total: 3.4 g/dL (ref 1.5–4.5)
Glucose: 120 mg/dL — ABNORMAL HIGH (ref 70–99)
Potassium: 3.6 mmol/L (ref 3.5–5.2)
Sodium: 136 mmol/L (ref 134–144)
Total Protein: 7.5 g/dL (ref 6.0–8.5)
eGFR: 43 mL/min/{1.73_m2} — ABNORMAL LOW (ref >59)

## 2021-09-30 NOTE — Patient Instructions (Signed)
Medication Instructions:  ?No changes ?*If you need a refill on your cardiac medications before your next appointment, please call your pharmacy* ? ? ?Lab Work:LIPI ?Your provider would like for you to have the following labs today: LIPID, CMET and TSH ? ?If you have labs (blood work) drawn today and your tests are completely normal, you will receive your results only by: ?MyChart Message (if you have MyChart) OR ?A paper copy in the mail ?If you have any lab test that is abnormal or we need to change your treatment, we will call you to review the results. ? ? ?Testing/Procedures: ?None ordered ? ? ?Follow-Up: ?At [facility], you and your health needs are our priority.  As part of our continuing mission to provide you with exceptional heart care, we have created designated Provider Care Teams.  These Care Teams include your primary Cardiologist (physician) and Advanced Practice Providers (APPs -  Physician Assistants and Nurse Practitioners) who all work together to provide you with the care you need, when you need it. ? ?We recommend signing up for the patient portal called "MyChart".  Sign up information is provided on this After Visit Summary.  MyChart is used to connect with patients for Virtual Visits (Telemedicine).  Patients are able to view lab/test results, encounter notes, upcoming appointments, etc.  Non-urgent messages can be sent to your provider as well.   ?To learn more about what you can do with MyChart, go to [email or web address].   ? ?Your next appointment:   ?6 month(s) ? ?The format for your next appointment:   ?In Person ? ?Provider:   ?[name], MD { ? ? ?Important Information About Sugar ? ? ? ? ? ? ?

## 2021-09-30 NOTE — Patient Outreach (Signed)
Care Coordination ? ?09/30/2021 ? ?[name] ?08-29-1955 ?[id number] ? ?RNCM called patient at scheduled time-patient stated he was leaving doctor's office following an appointment and could not talk right now, would need to talk later.  RNCM rescheduled appointment. ? ?[name] RN, BSN ?[facility] ?Care Management Coordinator - Managed Medicaid High Risk ?[phone] ?  ? ? ?

## 2021-09-30 NOTE — Progress Notes (Signed)
Patient ID: Terry Parks, male   DOB: 20-Mar-1956, 66 y.o.   MRN: [id number] ?  ? ?Cardiology Office Note   ? ?Date:  09/30/2021  ? ?ID:  Terry Parks, DOB [birth year], MRN [id number] ? ?PCP:  [name], MD  ?Cardiologist:   [name], MD  ? ?Chief Complaint  ?Patient presents with  ? Congestive Heart Failure  ? ? ? ?History of Present Illness:  ?Terry Parks is a 66 y.o. male with  combined systolic and diastolic heart failure (LVEF 35-40% by most recent echo October 2020) paroxysmal atrial fibrillation on amiodarone and Xarelto, HTN, OSA, DM. He has never had catheterization and has not had angina pectoris, but a nuclear stress test in 2013 showed a fixed anterior defect. ? ?He has systemic hypertension, severe obesity, obstructive sleep apnea on CPAP, and diabetes mellitus. The diagnosis of heart failure dates back to at least 2013 when he had a stress test that showed a fixed anterior defect and an ejection fraction of 30-35%.  He was hospitalized with acute heart failure exacerbation in January 2019, in the setting of acute kidney injury secondary to nephrolithiasis with ureteral obstruction.  During that same admission he had atrial fibrillation, treated with cardioversion but followed by recurrence on atrial flutter, treated with amiodarone and anticoagulation with rivaroxaban. ? ?He last required hospitalization for heart failure exacerbation in March 2021.  Discharge weight was 318 pounds (144 kg and discharge creatinine was 1.57 (baseline 1.3-1.4).  He developed transient acute kidney injury during the hospitalization for incisional hernia repair November 2021, but his kidney function recovered rapidly. ? ?He called the office a few days ago since he was experiencing edema of the hands and legs and wheezing, including at least 1 episode of shortness of breath with wheezing that woke him from sleep.  He was advised to increase his dose of diuretic but he realized that he had skipped his  medicine for a few days.  After restarting the medications his edema has improved significantly and he no longer has wheezing.  Continues to have NYHA functional class II exertional dyspnea, but denies orthopnea or PND. ? ?His compliance with dietary restrictions remains poor.  He loves to eat [place] fried chicken.  Compliance with medications appears to be spotty as well. His scale is not working. ? ?He does not think he has had any palpitations and denies dizziness or syncope.  He has lower extremity numbness consistent with diabetic neuropathy but has not had falls. ? ?He underwent upper endoscopy with Dr. Rush Landmark in January, after experiencing hematemesis.  He was found to have grade D esophagitis.  He has not had recurrent bleeding since then.  He is taking a proton pump inhibitor. ? ? ? ? ?Past Medical History:  ?Diagnosis Date  ? Abscessed tooth   ? top back large cavity no pain or drainage, one on bottom  pt pulled tooth 4-5 months ago, right top large hole in tooth  ? AKI (acute kidney injury) ([place])   ? Allergic rhinitis   ? Anemia   ? Anxiety   ? Asthma   ? Atrial fibrillation ([place])   ? BPH (benign prostatic hypertrophy)   ? Massive BPH noted on cystoscopy 1/23/ 2012 by Dr. Risa Grill.  ? Cancer [facility])   ? prostate cancer 2019  ? Cardiomyopathy ([place])   ? CHF (congestive heart failure) ([place])   ? Cough 03/30/2012  ? Depression   ? Diabetes mellitus 04/08/2008  ? type 2  ? Dyspnea   ?  Dysrhythmia 2019  ? Foley catheter in place 07-05-17 placed  ? Fracture, orbital ([place]) 2021  ? Right  ? GERD (gastroesophageal reflux disease) 2020  ? Headache(784.0)   ? hx migraines none recent  ? History of esophagitis 12/16/2019  ? Hyperlipemia   ? Hypertension   ? Hypertensive cardiopathy 03/01/2006  ? 2-D echocardiogram 02/01/2012 showed moderate LVH, mildly to moderately reduced left ventricular systolic function with an estimated ejection fraction of 40-45%, and diffuse hypokinesis.  A nuclear medicine stress study done  01/31/2012 showed no reversible ischemia, a small mid anterior wall fixed defect/infarct, and ejection fraction 42%.      ? Neck pain   ? Nephrolithiasis 05/29/2010  ? CT scan of abdomen/pelvis on 05/29/2010 showed an obstructing approximate 1-2 mm calculus at the left UVJ, and an approximate 1-2 mm left lower pole renal calculus.   Patient had continuing severe pain , and an elevation of his serum creatinine to a value of 1.75 on 06/06/2010.  Patient underwent cystoscopy on 06/08/2010 by Dr. Risa Grill, but attempts at retrograde pyelogram and ureteroscopy were unsucc  ? Numbness 01/08/2018  ? Obstructive sleep apnea 03/06/2008  ? Sleep study 03/06/08 showed severe OSA/hypopnea syndrome, with successful CPAP titration to 13 CWP using a medium ResMed Mirage Quattro full face mask with heated humidifier.   ? Rash 04/17/2014  ? Renal calculus 05/29/2010  ? CT scan of abdomen/pelvis on 05/29/2010 showed an obstructing approximate 1-2 mm calculus at the left UVJ, and an approximate 1-2 mm left lower pole renal calculus.   Patient had continuing severe pain , and an elevation of his serum creatinine to a value of 1.75 on 06/06/2010.  The stone had apparently passed and was not seen on repeat CT 06/08/2010.  ? Sleep apnea   ? haven't use cpap in 2 years  ? Tooth pain 10/29/2017  ? Urinary straining 11/02/2016  ? ? ?Past Surgical History:  ?Procedure Laterality Date  ? big toe nails removed Bilateral 20 yrs ago  ? CARDIOVERSION N/A 06/08/2017  ? Procedure: CARDIOVERSION;  Surgeon: Josue Hector, MD;  Location: [facility] ENDOSCOPY;  Service: Cardiovascular;  Laterality: N/A;  ? COLONOSCOPY    ? CYSTOSCOPY W/ RETROGRADES    ? FRACTURE SURGERY Right 2021  ? 5th right phalange  ? INCISIONAL HERNIA REPAIR N/A 03/24/2020  ? Procedure: LAPAROSCOPIC INCISIONAL HERNIA REPAR WITH MESH;  Surgeon: Georganna Skeans, MD;  Location: [place];  Service: General;  Laterality: N/A;  ? IR RADIOLOGIST EVAL & MGMT  02/02/2018  ? LYMPHADENECTOMY Bilateral 10/06/2017  ?  Procedure: LYMPHADENECTOMY;  Surgeon: Lucas Mallow, MD;  Location: [facility] ORS;  Service: Urology;  Laterality: Bilateral;  ? ROBOT ASSISTED LAPAROSCOPIC RADICAL PROSTATECTOMY N/A 10/06/2017  ? Procedure: XI ROBOTIC ASSISTED LAPAROSCOPIC RADICAL PROSTATECTOMY;  Surgeon: Lucas Mallow, MD;  Location: [facility] ORS;  Service: Urology;  Laterality: N/A;  ? TRANSURETHRAL RESECTION OF BLADDER TUMOR N/A 07/13/2017  ? Procedure: TRANSURETHRAL RESECTION OF PROSTATE;  Surgeon: Lucas Mallow, MD;  Location: [facility] ORS;  Service: Urology;  Laterality: N/A;  ? ? ?Outpatient Medications Prior to Visit  ?Medication Sig Dispense Refill  ? Accu-Chek Softclix Lancets lancets Check blood sugar three times a day as instructed 300 each 3  ? amiodarone (PACERONE) 200 MG tablet Take 1 tablet (200 mg total) by mouth daily. 60 tablet 6  ? amLODipine (NORVASC) 5 MG tablet TAKE ONE TABLET BY MOUTH EVERY EVENING 30 tablet 6  ? atorvastatin (LIPITOR) 40 MG tablet  TAKE ONE TABLET BY MOUTH EVERY EVENING 30 tablet 6  ? Blood Glucose Monitoring Suppl (ACCU-CHEK GUIDE) w/Device KIT 1 Units by Does not apply route 3 (three) times daily. 1 kit 0  ? carvedilol (COREG) 25 MG tablet TAKE ONE TABLET BY MOUTH EVERY MORNING and TAKE ONE TABLET BY MOUTH EVERY EVENING 180 tablet 3  ? COMFORT EZ PEN NEEDLES 31G X 6 MM MISC USE AS DIRECTED AT BEDTIME 100 each 3  ? COMFORT EZ PEN NEEDLES 32G X 6 MM MISC     ? DULoxetine (CYMBALTA) 60 MG capsule Take 1 capsule (60 mg total) by mouth daily. 90 capsule 3  ? empagliflozin (JARDIANCE) 25 MG TABS tablet TAKE 1 TABLET (25 MG TOTAL) BY MOUTH DAILY BEFORE BREAKFAST. 90 tablet 3  ? esomeprazole (NEXIUM) 40 MG capsule Take 1 capsule (40 mg total) by mouth 2 (two) times daily before a meal. 180 capsule 1  ? ezetimibe (ZETIA) 10 MG tablet Take 1 tablet (10 mg total) by mouth daily. 90 tablet 3  ? glucose blood (ACCU-CHEK GUIDE) test strip Use as instructed to check blood sugar 3 times daily 300 each 11  ? glucose blood  (ACCU-CHEK GUIDE) test strip Test 3 times daily 300 strip 3  ? Insulin Degludec-Liraglutide (XULTOPHY) 100-3.6 UNIT-MG/ML SOPN Inject 52 Units into the skin daily. 165 mL 0  ? rivaroxaban (XARELTO) 20 MG TABS tablet Ta

## 2021-10-01 ENCOUNTER — Other Ambulatory Visit (HOSPITAL_COMMUNITY): Payer: Self-pay

## 2021-10-01 ENCOUNTER — Other Ambulatory Visit: Payer: Self-pay | Admitting: Obstetrics and Gynecology

## 2021-10-01 MED ORDER — AMOXICILLIN 500 MG PO CAPS
500.0000 mg | ORAL_CAPSULE | Freq: Four times a day (QID) | ORAL | 0 refills | Status: DC
  Filled 2021-10-01: qty 28, 7d supply, fill #0

## 2021-10-01 NOTE — Patient Instructions (Signed)
Hi Mr. Fudala you are doing well-have a nice day!  Mr. Browe was given information about Medicaid Managed Care team care coordination services as a part of their [place] Medicaid benefit. Ronnie Doo verbally consented to engagement with the [facility] Managed Care team.   If you are experiencing a medical emergency, please call 911 or report to your local emergency department or urgent care.   If you have a non-emergency medical problem during routine business hours, please contact your provider's office and ask to speak with a nurse.   For questions related to your [facility], please call: [phone] or visit the homepage here: [email or web address]  If you would like to schedule transportation through your [facility], please call the following number at least 2 days in advance of your appointment: [phone].  Rides for urgent appointments can also be made after hours by calling Member Services.  Call the [facility] at [phone], at any time, 24 hours a day, 7 days a week. If you are in danger or need immediate medical attention call 911.  If you would like help to quit smoking, call 1-800-QUIT-NOW 575-832-3524) OR Espaol: 1-855-Djelo-Ya (4-132-440-1027) o para ms informacin haga clic aqu or Text READY to 200-400 to register via text  Mr. Balles - following are the goals we discussed in your visit today:   Goals Addressed             This Visit's Progress    Monitor and Manage My Blood Sugar-Diabetes Type 2       Timeframe:  Long-Range Goal Priority:  High Start Date:        06/03/20                     Expected End Date:   ongoing               Follow Up Date: 11/01/21   - check blood sugar at prescribed times - check blood sugar if I feel it is too high or too low - enter blood sugar readings and medication  or insulin into my phone log - take the blood sugar log to all doctor visits - take the blood sugar meter to all doctor visits    Why is this important?   Checking your blood sugar at home helps to keep it from getting very high or very low.  Writing the results in a diary or log helps the doctor know how to care for you.  Your blood sugar log should have the time, date and the results.  Also, write down the amount of insulin or other medicine that you take.  Other information, like what you ate, exercise done and how you were feeling, will also be helpful.     Notes:-  08/27/21:  Patient checks blood sugar daily, < 150, per patient    Protect My Health       Timeframe:  Long-Range Goal Priority:  Medium Start Date:      10/07/20                       Expected End Date:  ongoing                Follow Up Date:  11/01/21   - schedule appointment for flu shot - schedule appointment for vaccines needed due to my age or health - schedule recommended health tests. - schedule  and keep appointment for annual check-up     Why is this important?   Screening tests can find diseases early when they are easier to treat.  Your doctor or nurse will talk with you about which tests are important for you.  Getting shots for common diseases like the flu and shingles will help prevent them.   08/27/21:  patient to continue PT appts-to get referral for general surgeon regarding hernia.  To check on getting an aide and handicap placard.    Track and Manage Fluids and Swelling-Heart Failure       Timeframe:  Long-Range Goal Priority:  High Start Date:          01/14/20                   Expected End Date:   ongoing     Follow Up Date: 11/01/21   - call office if I gain more than 2 pounds in one day or 5 pounds in one week - do ankle pumps when sitting - use salt in moderation - watch for swelling in feet, ankles and legs every day - weigh myself daily  - log weight in my phone   Why is this  important?   It is important to check your weight daily and watch how much salt and liquids you have.  It will help you to manage your heart failure.    Notes: 10/01/21:  CARDS appt 09/30/21     Track and Manage My Blood Pressure-Hypertension       Timeframe:  Long-Range Goal Priority:  High Start Date:     06/03/20                        Expected End Date:  ongoing                 Follow Up Date: 11/01/21  - check blood pressure daily - write blood pressure results in my phone log    Why is this important?   You won't feel high blood pressure, but it can still hurt your blood vessels.  High blood pressure can cause heart or kidney problems. It can also cause a stroke.  Making lifestyle changes like losing a little weight or eating less salt will help.  Checking your blood pressure at home and at different times of the day can help to control blood pressure.  If the doctor prescribes medicine remember to take it the way the doctor ordered.  Call the office if you cannot afford the medicine or if there are questions about it 08/27/21:  BP = 120/76   Patient verbalizes understanding of instructions and care plan provided today and agrees to view in [facility]. Active MyChart status and patient understanding of how to access instructions and care plan via MyChart confirmed with patient.     The Managed Medicaid care management team will reach out to the patient again over the next 30 days.  The  Patient has been provided with contact information for the Managed Medicaid care management team and has been advised to call with any health related questions or concerns.   [name] RN, BSN [place] Management Coordinator - Managed Medicaid High Risk [phone]   Following is a copy of your plan of care:  Care Plan : CCM RN- Diabetes Type 2 (Adult)  Updates made by [name], RN since 10/01/2021 12:00 AM  Problem: Glycemic Management (Diabetes,  Type 2)   Priority: High  Onset Date: 06/03/2020     Long-Range Goal: Glycemic Management Optimized   Start Date: 07/20/2019  Expected End Date: 01/01/2022  Recent Progress: Not on track  Priority: High  Note:    CARE PLAN ENTRY (see longitudinal plan of care for additional care plan information)  Current Barriers:  Knowledge Deficits related to basic Diabetes pathophysiology and self care/management Knowledge Deficits related to medications used for management of diabetes 08/27/21:  Hgb A1C= 7.4 on 04/15/21.  Continues to check blood sugars daily, goal < 150, per patient  Case Manager Clinical Goal(s):  Over the next 30-60  days, patient will demonstrate improved adherence to prescribed treatment plan for diabetes self care/management as evidenced by:  daily monitoring and recording of CBG  adherence to ADA/ carb modified diet adherence to prescribed medication regimen  Interventions:  Reviewed education with patient about basic DM disease process Reviewed medications and assessed medication taking behavior Ensured patient has ample supply of all prescribed medications Advised patient, providing education and rationale, to check CBG at least daily and record, calling provider and/or CCM RN for findings outside established parameters. Educated patient that leg and foot pain will likely decrease if blood sugar control is improved and a CGM could provide that assistance  Discussed plans with patient for ongoing care management follow up and provided patient with direct contact information for care management team Review of patient status, including review of consultants reports, relevant laboratory and other test results, and medications completed.  Patient Self Care Activities:  UNABLE to independently self manage DM Self administers oral medications as prescribed Self administers insulin as prescribed Self administers injectable DM medication Xultophy as prescribed Attends all  scheduled provider appointments Checks blood sugars as prescribed and utilize hyper and hypoglycemia protocol as needed Adheres to prescribed ADA/carb modified check blood sugar at prescribed times - check blood sugar if I feel it is too high or too low - enter blood sugar readings and medication or insulin into my phone log - take the blood sugar log to all doctor visits - take the blood sugar meter to all doctor visit   Care Plan : CCM RN- Chronic Pain (Adult)- lower extremeties  Updates made by [name], RN since 10/01/2021 12:00 AM     Problem: Chronic Pain Management (Chronic Pain)   Priority: High  Onset Date: 07/15/2020     Long-Range Goal: Chronic Pain Managed   Start Date: 07/15/2020  Expected End Date: 12/27/2021  Recent Progress: On track  Priority: High  Note:   Current Barriers:  Knowledge Deficits related to self-health management of chronic lower extremity pain Chronic Disease Management support and education needs related to chronic pain 08/27/21:  No complaints of abdominal pain today-book and foot pain ongoing.  Patient to get referral to surgeon for hernia. Clinical Goal(s):  patient will verbalize understanding of plan for pain management.  and patient will use pharmacological and nonpharmacological pain relief strategies as prescribed.  Interventions:  Pain assessment performed Medications reviewed Discussed plans with patient for ongoing care management follow up and provided patient with direct contact information for care management team Discussed improvement  in DM control will likely result in less pain in legs and feet  Patient Goals/Self Care Activities:  Will self-administer medications as prescribed Will attend all scheduled provider appointments Will call pharmacy for medication refills 7 days prior to needed refill date Patient will calls provider office for new  concerns or questions Patient will get appt scheduled with general surgeon Follow Up  Plan: The care management team will reach out to the patient again over the next 30-60 days.    Care Plan : General Plan of Care (Adult)  Updates made by [name], RN since 10/01/2021 12:00 AM     Problem: Health Promotion or Disease Self-Management (General Plan of Care)   Priority: Medium  Onset Date: 10/07/2020     Long-Range Goal: Self-Management Plan Developed   Start Date: 10/07/2020  Expected End Date: 11/26/2021  Recent Progress: On track  Priority: Medium  Note:   Current Barriers:  Ineffective Self Health Maintenance Patient needs aide and eyeglasses Currently UNABLE TO independently self manage needs related to chronic health conditions.  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  Patient currently attending PT sessions for [facility] monthly and doing home exercises with improvement-currently wears adult diapers, 4-5 a day and has nocturia several times a night.  Patient states he does have some depression being by himself all of the time-declined referral.  Did discuss opportunities for more social contact-volunteering which patient states he currently does with [employer], exercise.  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 08/27/21:  Patient has received resources.  No complaints today-states he is doing well. Nurse Case Manager Clinical Goal(s):  patient will work with care management team to address care coordination and chronic disease management needs related to Disease Management Educational Needs Care Coordination Medication Management and Education Medication Reconciliation Medication Assistance  Psychosocial Support   Interventions:  Evaluation of current treatment plan and patient's adherence to plan as established by provider. Reviewed medications with  patient. Collaborated with pharmacy regarding referrals. Collaborated with PCP for referrals-completed Collaborated with Care Guide for eyeglass provider that accepts patient's insurance-completed Care Guide referral for eyeglass provider-completed Discussed plans with patient for ongoing care management follow up and provided patient with direct contact information for care management team Advised patient, providing education and rationale, to monitor blood pressure daily and record, calling provider for findings outside established parameters.  Reviewed scheduled/upcoming provider appointments. Collaborated with PCP for general surgeon and podiatry referral-completed Advised patient, providing education and rationale, to check cbg and record, calling provider for findings outside established parameters.   Pharmacy referral for medication review. Advised patient to contact insurance company regarding resumption of aide service-completed Care Guide referral for first time home buyer resources-completed Collaborated with Care Guide for first time home buyer resources Self Care Activities:  Patient will self administer medications as prescribed Patient will attend all scheduled provider appointments Patient will call pharmacy for medication refills Patient will continue to perform ADL's independently Patient will call provider office for new concerns or questions Patient Goals:. In the next 30 days, patient will attend all appointments. - Follow Up Plan: The patient has been provided with contact information for the care management team and has been advised to call with any health related questions or concerns.  The care management team will reach out to the patient again over the next 30 days.

## 2021-10-01 NOTE — Patient Outreach (Signed)
Medicaid Managed Care   Nurse Care Manager Note  10/01/2021 Name:  Terry Parks MRN:  [id number] DOB:  August 29, 1955  Terry Parks is an 66 y.o. year old male who is a primary patient of [name], MD.  [facility] Managed Care Coordination team was consulted for assistance with:    Chronic healthcare management needs, HTN, DM, CHF, chronic pain, OSA, HLD, CKD, vision loss, PAF  Terry Parks was given information about Medicaid Managed Care Coordination team services today. Terry Parks Patient agreed to services and verbal consent obtained.  Engaged with patient by telephone for follow up visit in response to provider referral for case management and/or care coordination services.   Assessments/Interventions:  Review of past medical history, allergies, medications, health status, including review of consultants reports, laboratory and other test data, was performed as part of comprehensive evaluation and provision of chronic care management services.  SDOH (Social Determinants of Health) assessments and interventions performed:  Care Plan  Allergies  Allergen Reactions   Lisinopril Cough    Medications Reviewed Today     Reviewed by [name], RN (Registered Nurse) on 10/01/21 at [phone]  Med List Status: <None>   Medication Order Taking? Sig Documenting Provider Last Dose Status Informant  Accu-Chek Softclix Lancets lancets [id number] No Check blood sugar three times a day as instructed [name], MD Taking Active   amiodarone (PACERONE) 200 MG tablet [id number] No Take 1 tablet (200 mg total) by mouth daily. [name], [name], MD Taking Active   amLODipine (NORVASC) 5 MG tablet [id number] No TAKE ONE TABLET BY MOUTH EVERY EVENING [name], [name], MD Taking Active   atorvastatin (LIPITOR) 40 MG tablet [id number] No TAKE ONE TABLET BY MOUTH EVERY EVENING [name], [name], MD Taking Active   Blood Glucose Monitoring Suppl (ACCU-CHEK GUIDE) w/Device KIT [id number] No 1  Units by Does not apply route 3 (three) times daily. [name], MD Taking Active   carvedilol (COREG) 25 MG tablet [id number] No TAKE ONE TABLET BY MOUTH EVERY MORNING and TAKE ONE TABLET BY MOUTH EVERY EVENING [name], [name], MD Taking Active   COMFORT EZ PEN NEEDLES 31G X 6 MM Esto [id number] No USE AS DIRECTED AT BEDTIME [name], MD Taking Active   COMFORT EZ PEN NEEDLES 32G X 6 MM [place] [id number] No  [provider] Taking Active   DULoxetine (CYMBALTA) 60 MG capsule [id number] No Take 1 capsule (60 mg total) by mouth daily. [name], MD Taking Active   empagliflozin (JARDIANCE) 25 MG TABS tablet [id number] No TAKE 1 TABLET (25 MG TOTAL) BY MOUTH DAILY BEFORE BREAKFAST. [name], MD Taking Active   esomeprazole (NEXIUM) 40 MG capsule [id number] No Take 1 capsule (40 mg total) by mouth 2 (two) times daily before a meal. Mansouraty, [name], MD Taking Active   ezetimibe (ZETIA) 10 MG tablet [id number] No Take 1 tablet (10 mg total) by mouth daily. [name], [name], MD Taking Expired 09/30/21 2359   glucose blood (ACCU-CHEK GUIDE) test strip [id number] No Use as instructed to check blood sugar 3 times daily [name], MD Taking Active   glucose blood (ACCU-CHEK GUIDE) test strip [id number] No Test 3 times daily [name], MD Taking Active   Insulin Degludec-Liraglutide (XULTOPHY) 100-3.6 UNIT-MG/ML SOPN [id number] No Inject 52 Units into the skin daily. [name], MD Taking Active   rivaroxaban (XARELTO) 20 MG TABS tablet [id number] No Take 1 tablet (20 mg total) by mouth daily with breakfast. [name], [name], MD Taking Active   sacubitril-valsartan (ENTRESTO)  97-103 MG [id number] No TAKE ONE TABLET BY MOUTH EVERY MORNING and TAKE ONE TABLET BY MOUTH EVERY EVENING [name], [name], MD Taking Active   spironolactone (ALDACTONE) 50 MG tablet [id number] No TAKE ONE TABLET BY MOUTH EVERY MORNING [name], [name], MD Taking Active   sucralfate (CARAFATE) 1 GM/10ML  suspension [id number] No Take 10 mLs (1 g total) by mouth 4 (four) times daily.  Patient not taking: Reported on 09/30/2021   Mansouraty, [name], MD Not Taking Active   torsemide (DEMADEX) 20 MG tablet [id number] No TAKE TWO TABLETS BY MOUTH EVERY MORNING [name], [name], MD Taking Active            Patient Active Problem List   Diagnosis Date Noted   Recurrent abdominal hernia without obstruction or gangrene 05/22/2021   Pyrosis 05/22/2021   Indigestion 76/22/6333   Periumbilical pain 54/56/2563   Abdominal pain, epigastric 05/22/2021   Class 3 obesity ([place]) 11/10/2020   S/P TURP 11/10/2020   History of iron deficiency anemia 12/16/2019   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   Depression 01/31/2019   Iron deficiency anemia 04/28/2018   CKD stage 3 due to type 2 diabetes mellitus ([place]) 02/11/2018   Healthcare maintenance 02/11/2018   Persistent atrial fibrillation    Vitamin D deficiency 06/22/2016   Allergic rhinitis 10/19/2012   Diabetic retinopathy ([place]) 10/04/2012   Type 2 diabetes mellitus with peripheral neuropathy ([place]) 04/08/2008   Obstructive sleep apnea    Morbid obesity ([place])    Hyperlipidemia associated with type 2 diabetes mellitus ([place])    Hypertension associated with diabetes ([place])    Chronic combined systolic and diastolic CHF (congestive heart failure) ([place]) 03/01/2006   Conditions to be addressed/monitored per PCP order:  Chronic healthcare management needs, HTN, DM, CHF, chronic pain, OSA, HLD, CKD, vision loss, PAF  Care Plan : CCM RN- Diabetes Type 2 (Adult)  Updates made by [name], RN since 10/01/2021 12:00 AM     Problem: Glycemic Management (Diabetes, Type 2)   Priority: High  Onset Date: 06/03/2020     Long-Range Goal: Glycemic Management Optimized   Start Date: 07/20/2019  Expected End Date: 01/01/2022  Recent Progress: Not on track  Priority: High  Note:    CARE PLAN ENTRY (see longitudinal plan of care for additional care plan  information)  Current Barriers:  Knowledge Deficits related to basic Diabetes pathophysiology and self care/management Knowledge Deficits related to medications used for management of diabetes 08/27/21:  Hgb A1C= 7.4 on 04/15/21.  Continues to check blood sugars daily, goal < 150, per patient  Case Manager Clinical Goal(s):  Over the next 30-60  days, patient will demonstrate improved adherence to prescribed treatment plan for diabetes self care/management as evidenced by:  daily monitoring and recording of CBG  adherence to ADA/ carb modified diet adherence to prescribed medication regimen  Interventions:  Reviewed education with patient about basic DM disease process Reviewed medications and assessed medication taking behavior Ensured patient has ample supply of all prescribed medications Advised patient, providing education and rationale, to check CBG at least daily and record, calling provider and/or CCM RN for findings outside established parameters. Educated patient that leg and foot pain will likely decrease if blood sugar control is improved and a CGM could provide that assistance  Discussed plans with patient for ongoing care management follow up and provided patient with direct contact information for care management team Review of patient status, including review of consultants reports, relevant laboratory and other test  results, and medications completed.  Patient Self Care Activities:  UNABLE to independently self manage DM Self administers oral medications as prescribed Self administers insulin as prescribed Self administers injectable DM medication Xultophy as prescribed Attends all scheduled provider appointments Checks blood sugars as prescribed and utilize hyper and hypoglycemia protocol as needed Adheres to prescribed ADA/carb modified check blood sugar at prescribed times - check blood sugar if I feel it is too high or too low - enter blood sugar readings and  medication or insulin into my phone log - take the blood sugar log to all doctor visits - take the blood sugar meter to all doctor visits    Care Plan : CCM RN- Chronic Pain (Adult)- lower extremeties  Updates made by [name], RN since 10/01/2021 12:00 AM     Problem: Chronic Pain Management (Chronic Pain)   Priority: High  Onset Date: 07/15/2020     Long-Range Goal: Chronic Pain Managed   Start Date: 07/15/2020  Expected End Date: 12/27/2021  Recent Progress: On track  Priority: High  Note:   Current Barriers:  Knowledge Deficits related to self-health management of chronic lower extremity pain Chronic Disease Management support and education needs related to chronic pain 08/27/21:  No complaints of abdominal pain today-book and foot pain ongoing.  Patient to get referral to surgeon for hernia. Clinical Goal(s):  patient will verbalize understanding of plan for pain management.  and patient will use pharmacological and nonpharmacological pain relief strategies as prescribed.  Interventions:  Pain assessment performed Medications reviewed Discussed plans with patient for ongoing care management follow up and provided patient with direct contact information for care management team Discussed improvement  in DM control will likely result in less pain in legs and feet  Patient Goals/Self Care Activities:  Will self-administer medications as prescribed Will attend all scheduled provider appointments Will call pharmacy for medication refills 7 days prior to needed refill date Patient will calls provider office for new concerns or questions Patient will get appt scheduled with general surgeon Follow Up Plan: The care management team will reach out to the patient again over the next 30-60 days.    Care Plan : General Plan of Care (Adult)  Updates made by [name], RN since 10/01/2021 12:00 AM     Problem: Health Promotion or Disease Self-Management (General Plan of Care)    Priority: Medium  Onset Date: 10/07/2020     Long-Range Goal: Self-Management Plan Developed   Start Date: 10/07/2020  Expected End Date: 11/26/2021  Recent Progress: On track  Priority: Medium  Note:   Current Barriers:  Ineffective Self Health Maintenance Patient needs aide and eyeglasses Currently UNABLE TO independently self manage needs related to chronic health conditions.  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  Patient currently attending PT sessions for [facility] monthly and doing home exercises with improvement-currently wears adult diapers, 4-5 a day and has nocturia several times a night.  Patient states he does have some depression being by himself all of the time-declined referral.  Did discuss opportunities for more social contact-volunteering which patient states he currently does with [employer], exercise.  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 08/27/21:  Patient has received resources.  No complaints today-states he is doing well. Nurse Case Manager Clinical Goal(s):  patient will work with care management team to address care coordination and chronic disease management needs related to Disease Management Educational Needs Care Coordination Medication Management and Education Medication Reconciliation Medication Assistance  Psychosocial Support   Interventions:  Evaluation of current treatment plan and patient's adherence to plan as established by provider. Reviewed medications with patient. Collaborated with pharmacy regarding referrals. Collaborated with PCP for referrals-completed Collaborated with Care Guide for eyeglass provider that accepts patient's insurance-completed Care Guide referral for eyeglass provider-completed Discussed plans with patient for  ongoing care management follow up and provided patient with direct contact information for care management team Advised patient, providing education and rationale, to monitor blood pressure daily and record, calling provider for findings outside established parameters.  Reviewed scheduled/upcoming provider appointments. Collaborated with PCP for general surgeon and podiatry referral-completed Advised patient, providing education and rationale, to check cbg and record, calling provider for findings outside established parameters.   Pharmacy referral for medication review. Advised patient to contact insurance company regarding resumption of aide service-completed Care Guide referral for first time home buyer resources-completed Collaborated with Care Guide for first time home buyer resources Self Care Activities:  Patient will self administer medications as prescribed Patient will attend all scheduled provider appointments Patient will call pharmacy for medication refills Patient will continue to perform ADL's independently Patient will call provider office for new concerns or questions Patient Goals:. In the next 30 days, patient will attend all appointments. - Follow Up Plan: The patient has been provided with contact information for the care management team and has been advised to call with any health related questions or concerns.  The care management team will reach out to the patient again over the next 30 days.     Follow Up:  Patient agrees to Care Plan and Follow-up.  Plan: The Managed Medicaid care management team will reach out to the patient again over the next 30 days. and The  Patient has been provided with contact information for the Managed Medicaid care management team and has been advised to call with any health related questions or concerns.  Date/time of next scheduled RN care management/care coordination outreach:  11/02/21 at 1030.

## 2021-10-02 ENCOUNTER — Other Ambulatory Visit: Payer: Medicaid Other | Admitting: Obstetrics and Gynecology

## 2021-10-02 NOTE — Patient Outreach (Signed)
Care Coordination  10/02/2021  Hulen Mandler 1956-04-15 [id number]   RNCM returned patient's phone call regarding transportation.  Patient asking about [facility] Transportation.  RNCM discussed with patient [place] no longer available.  Patient has [facility] Transportation phone number and will call to schedule transportation with [facility] for upcoming appointments.  [name] RN, BSN [place]  [name] - Managed Medicaid High Risk [phone].

## 2021-10-05 DIAGNOSIS — C61 Malignant neoplasm of prostate: Secondary | ICD-10-CM | POA: Diagnosis not present

## 2021-10-06 ENCOUNTER — Other Ambulatory Visit: Payer: Self-pay | Admitting: *Deleted

## 2021-10-06 DIAGNOSIS — K432 Incisional hernia without obstruction or gangrene: Secondary | ICD-10-CM | POA: Diagnosis not present

## 2021-10-06 DIAGNOSIS — R634 Abnormal weight loss: Secondary | ICD-10-CM | POA: Diagnosis not present

## 2021-10-06 DIAGNOSIS — I509 Heart failure, unspecified: Secondary | ICD-10-CM | POA: Diagnosis not present

## 2021-10-06 DIAGNOSIS — Z7901 Long term (current) use of anticoagulants: Secondary | ICD-10-CM | POA: Diagnosis not present

## 2021-10-06 DIAGNOSIS — I4819 Other persistent atrial fibrillation: Secondary | ICD-10-CM

## 2021-10-06 MED ORDER — RIVAROXABAN 15 MG PO TABS: 15.0000 mg | ORAL_TABLET | Freq: Every day | ORAL | 3 refills | Status: DC

## 2021-10-15 ENCOUNTER — Telehealth: Payer: Self-pay | Admitting: Student

## 2021-10-15 NOTE — Telephone Encounter (Signed)
Refill Request-    Insulin Degludec-Liraglutide (XULTOPHY) 100-3.6 UNIT-MG/ML SOPN  [facility]

## 2021-10-16 ENCOUNTER — Other Ambulatory Visit (HOSPITAL_COMMUNITY): Payer: Self-pay

## 2021-10-16 ENCOUNTER — Ambulatory Visit: Payer: Medicaid Other | Admitting: Internal Medicine

## 2021-10-16 ENCOUNTER — Encounter: Payer: Self-pay | Admitting: Internal Medicine

## 2021-10-16 VITALS — BP 165/104 | HR 80 | Temp 98.0°F | Ht 72.0 in | Wt 325.4 lb

## 2021-10-16 DIAGNOSIS — I5042 Chronic combined systolic (congestive) and diastolic (congestive) heart failure: Secondary | ICD-10-CM | POA: Diagnosis not present

## 2021-10-16 DIAGNOSIS — I152 Hypertension secondary to endocrine disorders: Secondary | ICD-10-CM

## 2021-10-16 DIAGNOSIS — E1159 Type 2 diabetes mellitus with other circulatory complications: Secondary | ICD-10-CM

## 2021-10-16 DIAGNOSIS — E1142 Type 2 diabetes mellitus with diabetic polyneuropathy: Secondary | ICD-10-CM | POA: Diagnosis present

## 2021-10-16 DIAGNOSIS — Z794 Long term (current) use of insulin: Secondary | ICD-10-CM

## 2021-10-16 LAB — GLUCOSE, CAPILLARY: Glucose-Capillary: 108 mg/dL — ABNORMAL HIGH (ref 70–99)

## 2021-10-16 LAB — POCT GLYCOSYLATED HEMOGLOBIN (HGB A1C): Hemoglobin A1C: 8.4 % — AB (ref 4.0–5.6)

## 2021-10-16 MED ORDER — XULTOPHY 100-3.6 UNIT-MG/ML ~~LOC~~ SOPN
52.0000 [IU] | PEN_INJECTOR | Freq: Every day | SUBCUTANEOUS | 0 refills | Status: DC
  Filled 2021-10-16 – 2021-11-04 (×2): qty 15, 28d supply, fill #0

## 2021-10-16 MED ORDER — ACCU-CHEK SOFTCLIX LANCETS MISC
3 refills | Status: DC
  Filled 2021-10-16: qty 200, 66d supply, fill #0

## 2021-10-16 MED ORDER — COMBIVENT RESPIMAT 20-100 MCG/ACT IN AERS
1.0000 | INHALATION_SPRAY | Freq: Four times a day (QID) | RESPIRATORY_TRACT | 3 refills | Status: DC
  Filled 2021-10-16: qty 4, 30d supply, fill #0

## 2021-10-16 MED ORDER — ACCU-CHEK GUIDE VI STRP
ORAL_STRIP | Freq: Three times a day (TID) | 3 refills | Status: DC
  Filled 2021-10-16: qty 250, 84d supply, fill #0

## 2021-10-16 MED ORDER — BLOOD GLUCOSE MONITOR SYSTEM W/DEVICE KIT
PACK | Freq: Four times a day (QID) | 0 refills | Status: DC
  Filled 2021-10-16: qty 1, 1d supply, fill #0

## 2021-10-16 NOTE — Patient Instructions (Signed)
Thank you, [name] for allowing Terry Parks to provide your care today. Today we discussed your diabetes, heart failure, and blood pressure.  Your A1c increased  to 8.4 today. You are headed in the right direction overall, but you are still above goal today. We discussed cutting back on sodas and sugary foods/drinks to help decrease your A1c.  I have refilled some of your medications listed below.  If your chest pain/tightness does not resolve by early next week after restarting your medications, call the clinic or go directly to the ED for further evaluation.  I have ordered the following labs for you:  Lab Orders         Glucose, capillary         POC Hbg A1C       Referrals ordered today:   Referral Orders  No referral(s) requested today     I have ordered the following medication/changed the following medications:   Start the following medications: Meds ordered this encounter  Medications   Insulin Degludec-Liraglutide (XULTOPHY) 100-3.6 UNIT-MG/ML SOPN    Sig: Inject 52 Units into the skin daily.    Dispense:  165 mL    Refill:  0   glucose blood (ACCU-CHEK GUIDE) test strip    Sig: Test 3 times daily    Dispense:  300 strip    Refill:  3   Ipratropium-Albuterol (COMBIVENT RESPIMAT) 20-100 MCG/ACT AERS respimat    Sig: Inhale 1 puff into the lungs every 6 (six) hours.    Dispense:  4 g    Refill:  3   blood glucose meter kit and supplies KIT    Sig: Dispense based on patient and insurance preference. Use up to four times daily as directed.    Dispense:  1 each    Refill:  0    Order Specific Question:   Number of strips    Answer:   300    Order Specific Question:   Number of lancets    Answer:   300     Follow up: 3 months  Should you have any questions or concerns please call the internal medicine clinic at [phone].

## 2021-10-16 NOTE — Progress Notes (Addendum)
CC: medication refill and follow-up  HPI:  [name] is a 66 y.o. with past medical history as noted below who presents to the clinic today for medication refill and routine follow-up. Please see problem-based list for further details, assessments, and plans.   The patient overall endorses compliance with his medications at home.  However, he states that, about 4 days ago, he went to [place] and did not bring enough medications with him so was unable to take his meds.  Since then, he has had chest pain/tightness was felt like he has been more fluid overloaded lately.  He is requesting a refill for his told he will be, but lives states that he recently picked up some new medications.  He states he has gained about 6 pounds in 4 days.  He endorses shortness of breath that is worsened in general but denies any orthopnea.  Endorses a chest pain/tightness when lying down as well as a cough that is worsened with lying flat.  He states that he is on a lot of medications and is not interested in adding or adjusting any of his medications today.  Past Medical History:  Diagnosis Date   Abscessed tooth    top back large cavity no pain or drainage, one on bottom  pt pulled tooth 4-5 months ago, right top large hole in tooth   AKI (acute kidney injury) ([place])    Allergic rhinitis    Anemia    Anxiety    Asthma    Atrial fibrillation (HCC)    BPH (benign prostatic hypertrophy)    Massive BPH noted on cystoscopy 1/23/ 2012 by Dr. [name].   Cancer [facility])    prostate cancer 2019   Cardiomyopathy [facility])    CHF (congestive heart failure) ([place])    Cough 03/30/2012   Depression    Diabetes mellitus 04/08/2008   type 2   Dyspnea    Dysrhythmia 2019   Foley catheter in place 07-05-17 placed   Fracture, orbital ([place]) 2021   Right   GERD (gastroesophageal reflux disease) 2020   Headache(784.0)    hx migraines none recent   History of esophagitis 12/16/2019   Hyperlipemia    Hypertension     Hypertensive cardiopathy 03/01/2006   2-D echocardiogram 02/01/2012 showed moderate LVH, mildly to moderately reduced left ventricular systolic function with an estimated ejection fraction of 40-45%, and diffuse hypokinesis.  A nuclear medicine stress study done 01/31/2012 showed no reversible ischemia, a small mid anterior wall fixed defect/infarct, and ejection fraction 42%.       Neck pain    Nephrolithiasis 05/29/2010   CT scan of abdomen/pelvis on 05/29/2010 showed an obstructing approximate 1-2 mm calculus at the left UVJ, and an approximate 1-2 mm left lower pole renal calculus.   Patient had continuing severe pain , and an elevation of his serum creatinine to a value of 1.75 on 06/06/2010.  Patient underwent cystoscopy on 06/08/2010 by Dr. [name], but attempts at retrograde pyelogram and ureteroscopy were unsucc   Numbness 01/08/2018   Obstructive sleep apnea 03/06/2008   Sleep study 03/06/08 showed severe OSA/hypopnea syndrome, with successful CPAP titration to 13 CWP using a medium ResMed Mirage Quattro full face mask with heated humidifier.    Rash 04/17/2014   Renal calculus 05/29/2010   CT scan of abdomen/pelvis on 05/29/2010 showed an obstructing approximate 1-2 mm calculus at the left UVJ, and an approximate 1-2 mm left lower pole renal calculus.   Patient had continuing severe pain ,  and an elevation of his serum creatinine to a value of 1.75 on 06/06/2010.  The stone had apparently passed and was not seen on repeat CT 06/08/2010.   Sleep apnea    haven't use cpap in 2 years   Tooth pain 10/29/2017   Urinary straining 11/02/2016   Review of Systems: Negative aside from that listed in individualized problem based charting.   Physical Exam:  Vitals:   10/16/21 1047 10/16/21 1054  BP: (!) 166/104 (!) 165/104  Pulse: 91 80  Temp: 98 F (36.7 C)   TempSrc: Oral   SpO2: 96%   Weight: (!) 325 lb 6.4 oz (147.6 kg)   Height: 6' (1.829 m)    General: NAD, nl appearance HE: Normocephalic,  atraumatic, EOMI, Conjunctivae normal ENT: No congestion, no rhinorrhea, no exudate or erythema  Cardiovascular: Normal rate, regular rhythm. No murmurs, rubs, or gallops Pulmonary: Effort normal, breath sounds normal. No wheezes, rales, or rhonchi Abdominal: soft, nontender, bowel sounds present Musculoskeletal: Trace pitting edema in BLE Skin: Warm, dry, no bruising, erythema, or rash Psychiatric/Behavioral: normal mood, normal behavior     Assessment & Plan:   See Encounters Tab for problem based charting.  Patient discussed with Dr.  Saverio Danker

## 2021-10-16 NOTE — Assessment & Plan Note (Addendum)
BP Readings from Last 3 Encounters:  10/16/21 (!) 165/104  09/30/21 122/72  09/05/21 (!) 138/91    Patient is on spironolactone 50 mg daily, losartan 100 mg daily, and carvedilol 25 mg daily.  He endorses compliance with these medications with the exception of a lapse during her recent trip out of town.  He is not interested in adjusting his blood pressure medication at all today.  Patient had systolics in the 628M-381R in the clinic today.  However, he has not taken any of his medications yet today.  Given that he is not interested in adjusting his regimen either, we will continue current regimen as above.

## 2021-10-16 NOTE — Addendum Note (Signed)
Addended by: [name] on: 10/16/2021 02:52 PM   Modules accepted: Orders

## 2021-10-16 NOTE — Telephone Encounter (Signed)
Next appt scheduled 10/16/21.

## 2021-10-16 NOTE — Assessment & Plan Note (Signed)
Discussed that patient's A1c has increased to 8.4, previously 7.4 about 6 months ago.  Patient states that he has not changed his regimen at all at home.  Glucose levels also at 108 today.  Patient is not interested in adjusting his diabetes medications at this time.  States he is interested in lifestyle changes, such as cutting out sugary drinks and decreasing carbohydrate intake.  Plan: Lifestyle modification given patient preference.  Follow-up in 3 months.

## 2021-10-16 NOTE — Assessment & Plan Note (Addendum)
Patient endorses medication compliance with the exception of the last several days when he did not bring enough of his medications with him when he went out of town to [place].  States that he just started taking his heart failure medications and that he feels fluid overloaded.  Exam is unremarkable for any rales, does have trace pitting edema in the BLE.  Of note, patient was prescribed Combivent in 2014 for a cough that was thought to be multifactorial with a component of systolic diastolic heart failure. He states he has not been on this medication recently but is interested in restarting it.   Plan: Discussed that his symptoms are less consistent with ACS given that this started only recently when he ran out of meds and denies any SOB with exertion but rather states his symptoms are worsened with lying flat.  Advised the patient to contact us early next week if his symptoms do not improve or to present to the ED for reevaluation.  We will continue to monitor for now. Reordered combivent for cough.

## 2021-10-19 NOTE — Addendum Note (Signed)
Addended by: Charise Killian on: 10/19/2021 10:09 AM   Modules accepted: Level of Service

## 2021-10-19 NOTE — Progress Notes (Signed)
Internal Medicine Clinic Attending ° °Case discussed with Dr. Bonanno  °  At the time of the visit.  We reviewed the resident’s history and exam and pertinent patient test results.  I agree with the assessment, diagnosis, and plan of care documented in the resident’s note. ° °

## 2021-10-20 ENCOUNTER — Telehealth: Payer: Self-pay

## 2021-10-20 NOTE — Telephone Encounter (Signed)
Pa for pt  ( XULTOPHY ) came through on cover my meds was submitted with office notes and last lab... awaiting approval or denial

## 2021-10-28 ENCOUNTER — Other Ambulatory Visit (HOSPITAL_COMMUNITY): Payer: Self-pay

## 2021-10-28 DIAGNOSIS — E119 Type 2 diabetes mellitus without complications: Secondary | ICD-10-CM | POA: Diagnosis not present

## 2021-10-28 DIAGNOSIS — R32 Unspecified urinary incontinence: Secondary | ICD-10-CM | POA: Diagnosis not present

## 2021-10-28 NOTE — Telephone Encounter (Signed)
DECISION :   Denied for not meeting prior auth  requirements .[name]    Per your health plan's criteria, this drug is covered if you meet the following: If the request is for a non-preferred drug, you have tried or cannot use two preferred drugs: Byetta, Ozempic, Trulicity and Victoza      ( COPY SENT TO PCP ALSO )

## 2021-10-29 ENCOUNTER — Other Ambulatory Visit (HOSPITAL_COMMUNITY): Payer: Self-pay

## 2021-11-02 ENCOUNTER — Other Ambulatory Visit: Payer: Self-pay | Admitting: Obstetrics and Gynecology

## 2021-11-02 ENCOUNTER — Other Ambulatory Visit: Payer: Self-pay | Admitting: Cardiovascular Disease

## 2021-11-02 ENCOUNTER — Other Ambulatory Visit: Payer: Self-pay

## 2021-11-02 DIAGNOSIS — E1142 Type 2 diabetes mellitus with diabetic polyneuropathy: Secondary | ICD-10-CM

## 2021-11-02 DIAGNOSIS — I4819 Other persistent atrial fibrillation: Secondary | ICD-10-CM

## 2021-11-02 MED ORDER — DULOXETINE HCL 60 MG PO CPEP: 60.0000 mg | ORAL_CAPSULE | Freq: Every day | ORAL | 3 refills | Status: DC

## 2021-11-02 MED ORDER — EMPAGLIFLOZIN 25 MG PO TABS: ORAL_TABLET | ORAL | 3 refills | Status: DC

## 2021-11-02 NOTE — Patient Outreach (Addendum)
Medicaid Managed Care   Nurse Care Manager Note  11/02/2021 Name:  Terry Parks MRN:  [id number] DOB:  [birth year]  Terry Parks is an 66 y.o. year old male who is a primary patient of Terry Beard, MD.  The [facility] Managed Care Coordination team was consulted for assistance with:    Chronic healthcare management needs, HTN, DM, HF, chronic pain, OSA, vision loss, PAF, HLD, CKD, OSA. H/o prostate cancer  Terry Parks was given information about Medicaid Managed Care Coordination team services today. Terry Parks Patient agreed to services and verbal consent obtained.  Engaged with patient by telephone for follow up visit in response to provider referral for case management and/or care coordination services.   Assessments/Interventions:  Review of past medical history, allergies, medications, health status, including review of consultants reports, laboratory and other test data, was performed as part of comprehensive evaluation and provision of chronic care management services.  SDOH (Social Determinants of Health) assessments and interventions performed: SDOH Interventions    Flowsheet Row Most Recent Value  SDOH Interventions   Food Insecurity Interventions Intervention Not Indicated  Housing Interventions Intervention Not Indicated     Care Plan  Allergies  Allergen Reactions   Lisinopril Cough    Medications Reviewed Today     Reviewed by [name], RN (Registered Nurse) on 11/02/21 at 1042  Med List Status: <None>   Medication Order Taking? Sig Documenting Provider Last Dose Status Informant  Accu-Chek Softclix Lancets lancets [id number]  Check blood sugar three times a day as instructed Terry Brill, MD  Active   amiodarone (PACERONE) 200 MG tablet [id number]  TAKE ONE TABLET BY MOUTH EVERY MORNING Parks, [name], MD  Active   amLODipine (NORVASC) 5 MG tablet [id number] No TAKE ONE TABLET BY MOUTH EVERY EVENING Parks, [name], MD Taking Active    amoxicillin (AMOXIL) 500 MG capsule [id number]  Take 1 capsule (500 mg total) by mouth every 6 (six) hours until gone.   Active   atorvastatin (LIPITOR) 40 MG tablet [id number] No TAKE ONE TABLET BY MOUTH EVERY EVENING Parks, [name], MD Taking Active   Blood Glucose Monitoring Suppl (ACCU-CHEK GUIDE) w/Device KIT [id number] No 1 Units by Does not apply route 3 (three) times daily. Terry Beard, MD Taking Active   Blood Glucose Monitoring Suppl (BLOOD GLUCOSE MONITOR SYSTEM) w/Device KIT [id number]  Use up to 4 (four) times daily as directed Terry Brill, MD  Active   carvedilol (COREG) 25 MG tablet [id number] No TAKE ONE TABLET BY MOUTH EVERY MORNING and TAKE ONE TABLET BY MOUTH EVERY EVENING Parks, [name], MD Taking Active   COMFORT EZ PEN NEEDLES 31G X 6 MM [place] [id number] No USE AS DIRECTED AT BEDTIME Terry Beard, MD Taking Active   COMFORT EZ PEN NEEDLES 32G X 6 MM MISC [id number] No  [provider] Taking Active   DULoxetine (CYMBALTA) 60 MG capsule [id number] No Take 1 capsule (60 mg total) by mouth daily. Terry Parks [place], MD Taking Expired 10/24/21 2359   empagliflozin (JARDIANCE) 25 MG TABS tablet [id number] No TAKE 1 TABLET (25 MG TOTAL) BY MOUTH DAILY BEFORE BREAKFAST. Terry Hansen, MD Taking Active   esomeprazole (NEXIUM) 40 MG capsule [id number] No Take 1 capsule (40 mg total) by mouth 2 (two) times daily before a meal. Parks, Terry Nab., MD Taking Active   ezetimibe (ZETIA) 10 MG tablet [id number] No Take 1 tablet (10 mg total) by mouth daily. Parks, Terry Gobble, MD Taking Expired 09/30/21 2359   glucose blood (ACCU-CHEK GUIDE)  test strip [id number]  Test 3 times daily Terry Brill, MD  Active   Insulin Degludec-Liraglutide (XULTOPHY) 100-3.6 UNIT-MG/ML SOPN [id number]  Inject 52 Units into the skin daily. Terry Brill, MD  Active   Ipratropium-Albuterol (COMBIVENT RESPIMAT) 20-100 MCG/ACT AERS respimat [id number]  Inhale 1 puff into the lungs every 6 (six)  hours. Terry Brill, MD  Active   rivaroxaban (XARELTO) 15 MG TABS tablet [id number]  Take 1 tablet (15 mg total) by mouth daily with breakfast. Parks, [name], MD  Active   sacubitril-valsartan (ENTRESTO) 97-103 MG [id number] No TAKE ONE TABLET BY MOUTH EVERY MORNING and TAKE ONE TABLET BY MOUTH EVERY EVENING Parks, [name], MD Taking Active   spironolactone (ALDACTONE) 50 MG tablet [id number] No TAKE ONE TABLET BY MOUTH EVERY MORNING Parks, [name], MD Taking Active   sucralfate (CARAFATE) 1 GM/10ML suspension [id number] No Take 10 mLs (1 g total) by mouth 4 (four) times daily.  Patient not taking: Reported on 09/30/2021   Parks, Terry Nab., MD Not Taking Active   torsemide (DEMADEX) 20 MG tablet [id number] No TAKE TWO TABLETS BY MOUTH EVERY MORNING Parks, [name], MD Taking Active            Patient Active Problem List   Diagnosis Date Noted   Recurrent abdominal hernia without obstruction or gangrene 05/22/2021   Pyrosis 05/22/2021   Indigestion 78/58/8502   Periumbilical pain 77/41/2878   Abdominal pain, epigastric 05/22/2021   Class 3 obesity ([place]) 11/10/2020   S/P TURP 11/10/2020   History of iron deficiency anemia 12/16/2019   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   Depression 01/31/2019   Iron deficiency anemia 04/28/2018   CKD stage 3 due to type 2 diabetes mellitus ([place]) 02/11/2018   Healthcare maintenance 02/11/2018   Persistent atrial fibrillation    Vitamin D deficiency 06/22/2016   Allergic rhinitis 10/19/2012   Diabetic retinopathy (Terry Parks) 10/04/2012   Type 2 diabetes mellitus with peripheral neuropathy (Terry Parks) 04/08/2008   Obstructive sleep apnea    Morbid obesity (Terry Parks)    Hyperlipidemia associated with type 2 diabetes mellitus (Terry Parks)    Hypertension associated with diabetes (Terry Parks)    Chronic combined systolic and diastolic CHF (congestive heart failure) ([place]) 03/01/2006   Conditions to be addressed/monitored per PCP order:  Chronic healthcare management  needs, HTN, DM, HF, chronic pain, OSA, vision loss, PAF, HLD, CKD, OSA. H/o prostate cancer  Care Plan : CCM RN- Diabetes Type 2 (Adult)  Updates made by [name], RN since 11/02/2021 12:00 AM     Problem: Glycemic Management (Diabetes, Type 2)   Priority: High  Onset Date: 06/03/2020     Long-Range Goal: Glycemic Management Optimized   Start Date: 07/20/2019  Expected End Date: 01/01/2022  Recent Progress: Not on track  Priority: High  Note:    CARE PLAN ENTRY (see longitudinal plan of care for additional care plan information)  Current Barriers:  Knowledge Deficits related to basic Diabetes pathophysiology and self care/management Knowledge Deficits related to medications used for management of diabetes 11/02/21:  Patient's A1C on 10/16/21= 8.4.  He has not been checking his blood sugars as he cannot find his meter-states he will look for it in  his house.  Case Manager Clinical Goal(s):  Over the next 30-60  days, patient will demonstrate improved adherence to prescribed treatment plan for diabetes self care/management as evidenced by:  daily monitoring and recording of CBG  adherence to ADA/ carb modified diet adherence to prescribed medication regimen  Interventions:  Reviewed education with patient about basic DM disease process Reviewed medications and assessed medication taking behavior Ensured patient has ample supply of all prescribed medications Advised patient, providing education and rationale, to check CBG at least daily and record, calling provider and/or CCM RN for findings outside established parameters. Educated patient that leg and foot pain will likely decrease if blood sugar control is improved and a CGM could provide that assistance  Discussed plans with patient for ongoing care management follow up and provided patient with direct contact information for care management team Review of patient status, including review of consultants reports, relevant  laboratory and other test results, and medications completed.  Patient Self Care Activities:  UNABLE to independently self manage DM Self administers oral medications as prescribed Self administers insulin as prescribed Self administers injectable DM medication Xultophy as prescribed Attends all scheduled provider appointments Checks blood sugars as prescribed and utilize hyper and hypoglycemia protocol as needed Adheres to prescribed ADA/carb modified check blood sugar at prescribed times - check blood sugar if I feel it is too high or too low - enter blood sugar readings and medication or insulin into my phone log - take the blood sugar log to all doctor visits - take the blood sugar meter to all doctor visits    Care Plan : CCM RN- Chronic Pain (Adult)- lower extremeties  Updates made by [name], RN since 11/02/2021 12:00 AM     Problem: Chronic Pain Management (Chronic Pain)   Priority: High  Onset Date: 07/15/2020     Long-Range Goal: Chronic Pain Managed   Start Date: 07/15/2020  Expected End Date: 12/27/2021  Recent Progress: On track  Priority: High  Note:   Current Barriers:  Knowledge Deficits related to self-health management of chronic lower extremity pain Chronic Disease Management support and education needs related to chronic pain 11/02/21:  patient states pain is improved-no complaints today Clinical Goal(s):  patient will verbalize understanding of plan for pain management.  and patient will use pharmacological and nonpharmacological pain relief strategies as prescribed.  Interventions:  Pain assessment performed Medications reviewed Discussed plans with patient for ongoing care management follow up and provided patient with direct contact information for care management team Discussed improvement  in DM control will likely result in less pain in legs and feet  Patient Goals/Self Care Activities:  Will self-administer medications as prescribed Will attend  all scheduled provider appointments Will call pharmacy for medication refills 7 days prior to needed refill date Patient will calls provider office for new concerns or questions Patient will get appt scheduled with general surgeon Follow Up Plan: The care management team will reach out to the patient again over the next 30-60 days.    Care Plan : General Plan of Care (Adult)  Updates made by [name], RN since 11/02/2021 12:00 AM     Problem: Health Promotion or Disease Self-Management (General Plan of Care)   Priority: Medium  Onset Date: 10/07/2020     Long-Range Goal: Self-Management Plan Developed   Start Date: 10/07/2020  Expected End Date: 02/02/2022  Recent Progress: On track  Priority: Medium  Note:   Current Barriers:  Ineffective Self Health Maintenance Currently UNABLE TO independently self manage needs related to chronic health conditions.  Knowledge Deficits related to short term plan for care coordination needs and long term plans for chronic disease management needs 11/02/21:  Patient states his swelling is improved-weight down to 298 today.  He has found  his compression socks and is using them some.  BP elevated at PCP visit 10/16/21-patient not checking BP as cannot find cuff-to look for it in his house-denies CO and SOB.  Saw general surgeon for hernia and he recommended losing weight before surgery. Nurse Case Manager Clinical Goal(s):  patient will work with care management team to address care coordination and chronic disease management needs related to Disease Management Educational Needs Care Coordination Medication Management and Education Medication Reconciliation Medication Assistance  Psychosocial Support   Interventions:  Evaluation of current treatment plan and patient's adherence to plan as established by provider. Reviewed medications with patient. Collaborated with pharmacy regarding referrals. Collaborated with PCP for  referrals-completed Collaborated with Care Guide for eyeglass provider that accepts patient's insurance-completed Care Guide referral for eyeglass provider-completed Discussed plans with patient for ongoing care management follow up and provided patient with direct contact information for care management team Advised patient, providing education and rationale, to monitor blood pressure daily and record, calling provider for findings outside established parameters.  Reviewed scheduled/upcoming provider appointments. Collaborated with PCP for general surgeon and podiatry referral-completed Advised patient, providing education and rationale, to check cbg and record, calling provider for findings outside established parameters.   Pharmacy referral for medication review. Advised patient to contact insurance company regarding resumption of aide service-completed Care Guide referral for first time home buyer resources-completed Collaborated with Care Guide for first time home buyer resources Self Care Activities:  Patient will self administer medications as prescribed Patient will attend all scheduled provider appointments Patient will call pharmacy for medication refills Patient will continue to perform ADL's independently Patient will call provider office for new concerns or questions Patient Goals:. In the next 30 days, patient will attend all appointments. In the next 30 days, patient will locate BP cuff and CBG meter and check BP and blood sugar as recommended. - Follow Up Plan: The patient has been provided with contact information for the care management team and has been advised to call with any health related questions or concerns.  The care management team will reach out to the patient again over the next 30 business  days.     Follow Up:  Patient agrees to Care Plan and Follow-up.  Plan: The Managed Medicaid care management team will reach out to the patient again over the next 30  business  days. and The  Patient has been provided with contact information for the Managed Medicaid care management team and has been advised to call with any health related questions or concerns.  Date/time of next scheduled RN care management/care coordination outreach:  12/08/21 at 1230

## 2021-11-02 NOTE — Patient Instructions (Signed)
Hey Terry Parks am glad you are feeling better-please remember to look for your BP cuff and your meter.  Have a great week!  Terry Parks was given information about Medicaid Managed Care team care coordination services as a part of their [place] Medicaid benefit. Terry Parks verbally consented to engagement with the [facility] Managed Care team.   If you are experiencing a medical emergency, please call 911 or report to your local emergency department or urgent care.   If you have a non-emergency medical problem during routine business hours, please contact your provider's office and ask to speak with a nurse.   For questions related to your [facility], please call: [phone] or visit the homepage here: [email or web address]  If you would like to schedule transportation through your [facility], please call the following number at least 2 days in advance of your appointment: [phone].  Rides for urgent appointments can also be made after hours by calling Member Services.  Call the [facility] at [phone], at any time, 24 hours a day, 7 days a week. If you are in danger or need immediate medical attention call 911.  If you would like help to quit smoking, call 1-800-QUIT-NOW 5595369137) OR Espaol: 1-855-Djelo-Ya (9-390-300-9233) o para ms informacin haga clic aqu or Text READY to 200-400 to register via text  Terry Parks - following are the goals we discussed in your visit today:   Goals Addressed             This Visit's Progress    Monitor and Manage My Blood Sugar-Diabetes Type 2       Timeframe:  Long-Range Goal Priority:  High Start Date:        06/03/20                     Expected End Date:   ongoing               Follow Up Date: 12/08/21   - check blood sugar at prescribed times - check blood sugar if I feel  it is too high or too low - enter blood sugar readings and medication or insulin into my phone log - take the blood sugar log to all doctor visits - take the blood sugar meter to all doctor visits    Why is this important?   Checking your blood sugar at home helps to keep it from getting very high or very low.  Writing the results in a diary or log helps the doctor know how to care for you.  Your blood sugar log should have the time, date and the results.  Also, write down the amount of insulin or other medicine that you take.  Other information, like what you ate, exercise done and how you were feeling, will also be helpful.     Notes:-  11/02/21:  Patient's A1C=8.4 on 10/16/21-not checking blood sugar as cannot find meter-states he will look for it in his house.     Protect My Health       Timeframe:  Long-Range Goal Priority:  Medium Start Date:      10/07/20                       Expected End Date:  ongoing                Follow Up Date:  12/08/21   -  schedule appointment for flu shot - schedule appointment for vaccines needed due to my age or health - schedule recommended health tests. - schedule and keep appointment for annual check-up   Why is this important?   Screening tests can find diseases early when they are easier to treat.  Your doctor or nurse will talk with you about which tests are important for you.  Getting shots for common diseases like the flu and shingles will help prevent them.   11/02/21:  Patient seen and evaluated by general surgeon 10/06/21 and PCP 10/16/21, to see dentist next week    Track and Manage Fluids and Swelling-Heart Failure       Timeframe:  Long-Range Goal Priority:  High Start Date:          01/14/20                   Expected End Date:   ongoing     Follow Up Date: 12/08/21   - call office if I gain more than 2 pounds in one day or 5 pounds in one week - do ankle pumps when sitting - use salt in moderation - watch for swelling in feet,  ankles and legs every day - weigh myself daily  - log weight in my phone   Why is this important?   It is important to check your weight daily and watch how much salt and liquids you have.  It will help you to manage your heart failure.    Notes: 11/02/21:  Patient states his weight is decreasing-down to 298 today-checks daily.    Track and Manage My Blood Pressure-Hypertension       Timeframe:  Long-Range Goal Priority:  High Start Date:     06/03/20                        Expected End Date:  ongoing                 Follow Up Date: 12/08/21  - check blood pressure daily - write blood pressure results in my phone log    Why is this important?   You won't feel high blood pressure, but it can still hurt your blood vessels.  High blood pressure can cause heart or kidney problems. It can also cause a stroke.  Making lifestyle changes like losing a little weight or eating less salt will help.  Checking your blood pressure at home and at different times of the day can help to control blood pressure.  If the doctor prescribes medicine remember to take it the way the doctor ordered.  Call the office if you cannot afford the medicine or if there are questions about it 11/02/21:  Patient's BP elevated at PCP office 10/16/21.  Patient has not been checking his BP because he cannot find his cuff.  Patient states he will look for cuff in his house-denies SOB and CP.   Patient verbalizes understanding of instructions and care plan provided today and agrees to view in [place]. Active MyChart status and patient understanding of how to access instructions and care plan via MyChart confirmed with patient.     The Managed Medicaid care management team will reach out to the patient again over the next 30 business  days.  The  Patient  has been provided with contact information for the Managed Medicaid care management team and has been advised to call with any health related questions or  concerns.   [name] RN, BSN [place] Management Coordinator - Managed Medicaid High Risk [phone]   Following is a copy of your plan of care:  Care Plan : CCM RN- Diabetes Type 2 (Adult)  Updates made by Terry Medicus, RN since 11/02/2021 12:00 AM     Problem: Glycemic Management (Diabetes, Type 2)   Priority: High  Onset Date: 06/03/2020     Long-Range Goal: Glycemic Management Optimized   Start Date: 07/20/2019  Expected End Date: 01/01/2022  Recent Progress: Not on track  Priority: High  Note:    CARE PLAN ENTRY (see longitudinal plan of care for additional care plan information)  Current Barriers:  Knowledge Deficits related to basic Diabetes pathophysiology and self care/management Knowledge Deficits related to medications used for management of diabetes 11/02/21:  Patient's A1C on 10/16/21= 8.4.  He has not been checking his blood sugars as he cannot find his meter-states he will look for it in  his house.  Case Manager Clinical Goal(s):  Over the next 30-60  days, patient will demonstrate improved adherence to prescribed treatment plan for diabetes self care/management as evidenced by:  daily monitoring and recording of CBG  adherence to ADA/ carb modified diet adherence to prescribed medication regimen  Interventions:  Reviewed education with patient about basic DM disease process Reviewed medications and assessed medication taking behavior Ensured patient has ample supply of all prescribed medications Advised patient, providing education and rationale, to check CBG at least daily and record, calling provider and/or CCM RN for findings outside established parameters. Educated patient that leg and foot pain will likely decrease if blood sugar control is improved and a CGM could provide that assistance  Discussed plans with patient for ongoing care management follow up and provided patient with direct contact information for care management  team Review of patient status, including review of consultants reports, relevant laboratory and other test results, and medications completed.  Patient Self Care Activities:  UNABLE to independently self manage DM Self administers oral medications as prescribed Self administers insulin as prescribed Self administers injectable DM medication Xultophy as prescribed Attends all scheduled provider appointments Checks blood sugars as prescribed and utilize hyper and hypoglycemia protocol as needed Adheres to prescribed ADA/carb modified check blood sugar at prescribed times - check blood sugar if I feel it is too high or too low - enter blood sugar readings and medication or insulin into my phone log - take the blood sugar log to all doctor visits - take the blood sugar meter to all doctor visits    Care Plan : CCM RN- Chronic Pain (Adult)- lower extremeties  Updates made by Terry Medicus, RN since 11/02/2021 12:00 AM     Problem: Chronic Pain Management (Chronic Pain)   Priority: High  Onset Date: 07/15/2020     Long-Range Goal: Chronic Pain Managed   Start Date: 07/15/2020  Expected End Date: 12/27/2021  Recent Progress: On track  Priority: High  Note:   Current Barriers:  Knowledge Deficits related to self-health management of chronic lower extremity pain Chronic Disease Management support and education needs related to chronic pain 11/02/21:  patient states pain is improved-no complaints today Clinical Goal(s):  patient will verbalize understanding of plan for pain management.  and patient will use pharmacological and nonpharmacological pain relief strategies as prescribed.  Interventions:  Pain assessment performed Medications reviewed Discussed plans with patient for ongoing care management follow up and provided patient with  direct contact information for care management team Discussed improvement  in DM control will likely result in less pain in legs and feet  Patient  Goals/Self Care Activities:  Will self-administer medications as prescribed Will attend all scheduled provider appointments Will call pharmacy for medication refills 7 days prior to needed refill date Patient will calls provider office for new concerns or questions Patient will get appt scheduled with general surgeon Follow Up Plan: The care management team will reach out to the patient again over the next 30-60 days.    Care Plan : General Plan of Care (Adult)  Updates made by Terry Medicus, RN since 11/02/2021 12:00 AM     Problem: Health Promotion or Disease Self-Management (General Plan of Care)   Priority: Medium  Onset Date: 10/07/2020     Long-Range Goal: Self-Management Plan Developed   Start Date: 10/07/2020  Expected End Date: 02/02/2022  Recent Progress: On track  Priority: Medium  Note:   Current Barriers:  Ineffective Self Health Maintenance Currently UNABLE TO independently self manage needs related to chronic health conditions.  Knowledge Deficits related to short term plan for care coordination needs and long term plans for chronic disease management needs 11/02/21:  Patient states his swelling is improved-weight down to 298 today.  He has found his compression socks and is using them some.  BP elevated at PCP visit 10/16/21-patient not checking BP as cannot find cuff-to look for it in his house-denies CO and SOB.  Saw general surgeon for hernia and he recommended losing weight before surgery. Nurse Case Manager Clinical Goal(s):  patient will work with care management team to address care coordination and chronic disease management needs related to Disease Management Educational Needs Care Coordination Medication Management and Education Medication Reconciliation Medication Assistance  Psychosocial Support   Interventions:  Evaluation of current treatment plan and patient's adherence to plan as established by provider. Reviewed medications with patient. Collaborated  with pharmacy regarding referrals. Collaborated with PCP for referrals-completed Collaborated with Care Guide for eyeglass provider that accepts patient's insurance-completed Care Guide referral for eyeglass provider-completed Discussed plans with patient for ongoing care management follow up and provided patient with direct contact information for care management team Advised patient, providing education and rationale, to monitor blood pressure daily and record, calling provider for findings outside established parameters.  Reviewed scheduled/upcoming provider appointments. Collaborated with PCP for general surgeon and podiatry referral-completed Advised patient, providing education and rationale, to check cbg and record, calling provider for findings outside established parameters.   Pharmacy referral for medication review. Advised patient to contact insurance company regarding resumption of aide service-completed Care Guide referral for first time home buyer resources-completed Collaborated with Care Guide for first time home buyer resources Self Care Activities:  Patient will self administer medications as prescribed Patient will attend all scheduled provider appointments Patient will call pharmacy for medication refills Patient will continue to perform ADL's independently Patient will call provider office for new concerns or questions Patient Goals:. In the next 30 days, patient will attend all appointments. In the next 30 days, patient will locate BP cuff and CBG meter and check BP and blood sugar as recommended. - Follow Up Plan: The patient has been provided with contact information for the care management team and has been advised to call with any health related questions or concerns.  The care management team will reach out to the patient again over the next 30 business  days.

## 2021-11-04 ENCOUNTER — Other Ambulatory Visit: Payer: Self-pay | Admitting: Student

## 2021-11-04 ENCOUNTER — Other Ambulatory Visit (HOSPITAL_COMMUNITY): Payer: Self-pay

## 2021-11-04 DIAGNOSIS — E1142 Type 2 diabetes mellitus with diabetic polyneuropathy: Secondary | ICD-10-CM

## 2021-11-04 MED ORDER — INSULIN GLARGINE 100 UNIT/ML ~~LOC~~ SOLN
50.0000 [IU] | Freq: Every day | SUBCUTANEOUS | 2 refills | Status: DC
  Filled 2021-11-04: qty 40, 80d supply, fill #0

## 2021-11-04 MED ORDER — TIRZEPATIDE 5 MG/0.5ML ~~LOC~~ SOAJ
5.0000 mg | SUBCUTANEOUS | 0 refills | Status: DC
  Filled 2021-11-04: qty 2, 28d supply, fill #0

## 2021-11-05 ENCOUNTER — Other Ambulatory Visit: Payer: Self-pay | Admitting: Student

## 2021-11-05 ENCOUNTER — Other Ambulatory Visit (HOSPITAL_COMMUNITY): Payer: Self-pay

## 2021-11-05 DIAGNOSIS — E1142 Type 2 diabetes mellitus with diabetic polyneuropathy: Secondary | ICD-10-CM

## 2021-11-05 MED ORDER — XULTOPHY 100-3.6 UNIT-MG/ML ~~LOC~~ SOPN: 52.0000 [IU] | PEN_INJECTOR | Freq: Every day | SUBCUTANEOUS | 5 refills | Status: DC

## 2021-11-06 ENCOUNTER — Other Ambulatory Visit (HOSPITAL_COMMUNITY): Payer: Self-pay

## 2021-12-08 ENCOUNTER — Other Ambulatory Visit: Payer: Self-pay | Admitting: Obstetrics and Gynecology

## 2021-12-08 NOTE — Patient Outreach (Signed)
Medicaid Managed Care   Nurse Care Manager Note  12/08/2021 Name:  Terry Parks MRN:  [id number] DOB:  04-15-1956  Terry Parks is an 66 y.o. year old male who is a primary patient of Terry Beard, MD.  The [facility] Managed Care Coordination team was consulted for assistance with:    Chronic healthcare management needs, HTN, DM, HF, chronic pain, OSA, vision loss, PAP, HLD, CKD  Terry Parks was given information about Medicaid Managed Care Coordination team services today. Terry Parks Patient agreed to services and verbal consent obtained.  Engaged with patient by telephone for follow up visit in response to provider referral for case management and/or care coordination services.   Assessments/Interventions:  Review of past medical history, allergies, medications, health status, including review of consultants reports, laboratory and other test data, was performed as part of comprehensive evaluation and provision of chronic care management services.  SDOH (Social Determinants of Health) assessments and interventions performed: SDOH Interventions    Flowsheet Row Most Recent Value  SDOH Interventions   Financial Strain Interventions Intervention Not Indicated  Stress Interventions Intervention Not Indicated       Care Plan  Allergies  Allergen Reactions   Lisinopril Cough    Medications Reviewed Today     Reviewed by Terry Medicus, RN (Registered Nurse) on 12/08/21 at [place] List Status: <None>   Medication Order Taking? Sig Documenting Provider Last Dose Status Informant  Accu-Chek Softclix Lancets lancets [id number]  Check blood sugar three times a day as instructed [name], MD  Active   amiodarone (PACERONE) 200 MG tablet [id number]  TAKE ONE TABLET BY MOUTH EVERY MORNING [name], [name], MD  Active   amLODipine (NORVASC) 5 MG tablet [id number] No TAKE ONE TABLET BY MOUTH EVERY EVENING [name], [name], MD Taking Active   amoxicillin (AMOXIL) 500  MG capsule [id number]  Take 1 capsule (500 mg total) by mouth every 6 (six) hours until gone.   Active   atorvastatin (LIPITOR) 40 MG tablet [id number] No TAKE ONE TABLET BY MOUTH EVERY EVENING [name], [name], MD Taking Active   Blood Glucose Monitoring Suppl (ACCU-CHEK GUIDE) w/Device KIT [id number] No 1 Units by Does not apply route 3 (three) times daily. Terry Beard, MD Taking Active   Blood Glucose Monitoring Suppl (BLOOD GLUCOSE MONITOR SYSTEM) w/Device KIT [id number]  Use up to 4 (four) times daily as directed [name], MD  Active   carvedilol (COREG) 25 MG tablet [id number] No TAKE ONE TABLET BY MOUTH EVERY MORNING and TAKE ONE TABLET BY MOUTH EVERY EVENING [name], [name], MD Taking Active   COMFORT EZ PEN NEEDLES 31G X 6 MM [place] [id number] No USE AS DIRECTED AT BEDTIME Terry Beard, MD Taking Active   COMFORT EZ PEN NEEDLES 32G X 6 MM [place] [id number] No  [provider] Taking Active   DULoxetine (CYMBALTA) 60 MG capsule [id number]  Take 1 capsule (60 mg total) by mouth daily. Terry Beard, MD  Active   empagliflozin (JARDIANCE) 25 MG TABS tablet [id number]  TAKE 1 TABLET (25 MG TOTAL) BY MOUTH DAILY BEFORE BREAKFAST. Terry Beard, MD  Active   esomeprazole (NEXIUM) 40 MG capsule [id number] No Take 1 capsule (40 mg total) by mouth 2 (two) times daily before a meal. Mansouraty, [name], MD Taking Active   ezetimibe (ZETIA) 10 MG tablet [id number] No Take 1 tablet (10 mg total) by mouth daily. [name], [name], MD Taking Expired 09/30/21 2359   glucose blood (ACCU-CHEK GUIDE) test strip [id number]  Test 3 times daily [name], MD  Active   Insulin Degludec-Liraglutide (XULTOPHY) 100-3.6 UNIT-MG/ML SOPN [id number]  Inject 52 Units into the skin daily. Rick Duff, MD  Active   Ipratropium-Albuterol (COMBIVENT RESPIMAT) 20-100 MCG/ACT AERS respimat [id number]  Inhale 1 puff into the lungs every 6 (six) hours. [name], MD  Active   rivaroxaban  (XARELTO) 15 MG TABS tablet [id number]  Take 1 tablet (15 mg total) by mouth daily with breakfast. [name], [name], MD  Active   sacubitril-valsartan (ENTRESTO) 97-103 MG [id number] No TAKE ONE TABLET BY MOUTH EVERY MORNING and TAKE ONE TABLET BY MOUTH EVERY EVENING [name], [name], MD Taking Active   spironolactone (ALDACTONE) 50 MG tablet [id number] No TAKE ONE TABLET BY MOUTH EVERY MORNING [name], [name], MD Taking Active   sucralfate (CARAFATE) 1 GM/10ML suspension [id number] No Take 10 mLs (1 g total) by mouth 4 (four) times daily.  Patient not taking: Reported on 09/30/2021   Mansouraty, [name], MD Not Taking Active   torsemide (DEMADEX) 20 MG tablet [id number] No TAKE TWO TABLETS BY MOUTH EVERY MORNING [name], [name], MD Taking Active             Patient Active Problem List   Diagnosis Date Noted   Recurrent abdominal hernia without obstruction or gangrene 05/22/2021   Pyrosis 05/22/2021   Indigestion 50/07/7046   Periumbilical pain 88/91/6945   Abdominal pain, epigastric 05/22/2021   Class 3 obesity ([place]) 11/10/2020   S/P TURP 11/10/2020   History of iron deficiency anemia 12/16/2019   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   Depression 01/31/2019   Iron deficiency anemia 04/28/2018   CKD stage 3 due to type 2 diabetes mellitus ([place]) 02/11/2018   Healthcare maintenance 02/11/2018   Persistent atrial fibrillation    Vitamin D deficiency 06/22/2016   Allergic rhinitis 10/19/2012   Diabetic retinopathy ([place]) 10/04/2012   Type 2 diabetes mellitus with peripheral neuropathy ([place]) 04/08/2008   Obstructive sleep apnea    Morbid obesity ([place])    Hyperlipidemia associated with type 2 diabetes mellitus ([place])    Hypertension associated with diabetes ([place])    Chronic combined systolic and diastolic CHF (congestive heart failure) ([place]) 03/01/2006   Conditions to be addressed/monitored per PCP order:  Chronic healthcare management needs, HTN, DM, HF, chronic pain, OSA, vision loss,  PAP, HLD, CKD  Care Plan : CCM RN- Diabetes Type 2 (Adult)  Updates made by Terry Medicus, RN since 12/08/2021 12:00 AM     Problem: Glycemic Management (Diabetes, Type 2)   Priority: High  Onset Date: 06/03/2020     Long-Range Goal: Glycemic Management Optimized   Start Date: 07/20/2019  Expected End Date: 03/10/2022  Recent Progress: Not on track  Priority: High  Note:    CARE PLAN ENTRY (see longitudinal plan of care for additional care plan information)  Current Barriers:  Knowledge Deficits related to basic Diabetes pathophysiology and self care/management Knowledge Deficits related to medications used for management of diabetes 12/08/21:  Patient not checking blood sugars, to follow up with provider regarding new meter. Case Manager Clinical Goal(s):  Over the next 30-60  days, patient will demonstrate improved adherence to prescribed treatment plan for diabetes self care/management as evidenced by:  daily monitoring and recording of CBG  adherence to ADA/ carb modified diet adherence to prescribed medication regimen  Interventions:  Reviewed education with patient about basic DM disease process Reviewed medications and assessed medication taking behavior Ensured patient has ample supply of all  prescribed medications Advised patient, providing education and rationale, to check CBG at least daily and record, calling provider and/or CCM RN for findings outside established parameters. Educated patient that leg and foot pain will likely decrease if blood sugar control is improved and a CGM could provide that assistance  Discussed plans with patient for ongoing care management follow up and provided patient with direct contact information for care management team Review of patient status, including review of consultants reports, relevant laboratory and other test results, and medications completed.  Patient Self Care Activities:  UNABLE to independently self manage DM Self  administers oral medications as prescribed Self administers insulin as prescribed Self administers injectable DM medication Xultophy as prescribed Attends all scheduled provider appointments Checks blood sugars as prescribed and utilize hyper and hypoglycemia protocol as needed Adheres to prescribed ADA/carb modified check blood sugar at prescribed times - check blood sugar if I feel it is too high or too low - enter blood sugar readings and medication or insulin into my phone log - take the blood sugar log to all doctor visits - take the blood sugar meter to all doctor visits    Care Plan : CCM RN- Chronic Pain (Adult)- lower extremeties  Updates made by Terry Medicus, RN since 12/08/2021 12:00 AM     Problem: Chronic Pain Management (Chronic Pain)   Priority: High  Onset Date: 07/15/2020     Long-Range Goal: Chronic Pain Managed   Start Date: 07/15/2020  Expected End Date: 03/10/2022  Recent Progress: On track  Priority: High  Note:   Current Barriers:  Knowledge Deficits related to self-health management of chronic lower extremity pain Chronic Disease Management support and education needs related to chronic pain 12/08/21:  Patient with no complaint today-wearing compression stockings helps his foot pain Clinical Goal(s):  patient will verbalize understanding of plan for pain management.  and patient will use pharmacological and nonpharmacological pain relief strategies as prescribed.  Interventions:  Pain assessment performed Medications reviewed Discussed plans with patient for ongoing care management follow up and provided patient with direct contact information for care management team Discussed improvement  in DM control will likely result in less pain in legs and feet  Patient Goals/Self Care Activities:  Will self-administer medications as prescribed Will attend all scheduled provider appointments Will call pharmacy for medication refills 7 days prior to needed refill  date Patient will calls provider office for new concerns or questions Patient will get appt scheduled with general surgeon Follow Up Plan: The care management team will reach out to the patient again over the next 30-60 days.    Care Plan : General Plan of Care (Adult)  Updates made by Terry Medicus, RN since 12/08/2021 12:00 AM     Problem: Health Promotion or Disease Self-Management (General Plan of Care)   Priority: Medium  Onset Date: 10/07/2020     Long-Range Goal: Self-Management Plan Developed   Start Date: 10/07/2020  Expected End Date: 02/02/2022  Recent Progress: On track  Priority: Medium  Note:   Current Barriers:  Ineffective Self Health Maintenance Currently UNABLE TO independently self manage needs related to chronic health conditions.  Knowledge Deficits related to short term plan for care coordination needs and long term plans for chronic disease management needs 12/08/21:  Patient has no complaints today-checking BP daily and WNL, not checking blood sugar as needs meter-patient states he will follow up.  Weight, swelling, and pain controlled Nurse Case Manager Clinical Goal(s):  patient will  work with care management team to address care coordination and chronic disease management needs related to Disease Management Educational Needs Care Coordination Medication Management and Education Medication Reconciliation Medication Assistance  Psychosocial Support   Interventions:  Evaluation of current treatment plan and patient's adherence to plan as established by provider. Reviewed medications with patient. Collaborated with pharmacy regarding referrals. Collaborated with PCP for referrals-completed Collaborated with Care Guide for eyeglass provider that accepts patient's insurance-completed Care Guide referral for eyeglass provider-completed Discussed plans with patient for ongoing care management follow up and provided patient with direct contact information for  care management team Advised patient, providing education and rationale, to monitor blood pressure daily and record, calling provider for findings outside established parameters.  Reviewed scheduled/upcoming provider appointments. Collaborated with PCP for general surgeon and podiatry referral-completed Advised patient, providing education and rationale, to check cbg and record, calling provider for findings outside established parameters.   Pharmacy referral for medication review. Advised patient to contact insurance company regarding resumption of aide service-completed Care Guide referral for first time home buyer resources-completed Collaborated with Care Guide for first time home buyer resources Self Care Activities:  Patient will self administer medications as prescribed Patient will attend all scheduled provider appointments Patient will call pharmacy for medication refills Patient will continue to perform ADL's independently Patient will call provider office for new concerns or questions Patient Goals:. In the next 30 days, patient will attend all appointments. In the next 30 days, patient will follow up on meter. - Follow Up Plan: The patient has been provided with contact information for the care management team and has been advised to call with any health related questions or concerns.  The care management team will reach out to the patient again over the next 30 business  days.     Follow Up:  Patient agrees to Care Plan and Follow-up.  Plan: The Managed Medicaid care management team will reach out to the patient again over the next 30 business  days. and The  Patient has been provided with contact information for the Managed Medicaid care management team and has been advised to call with any health related questions or concerns.  Date/time of next scheduled RN care management/care coordination outreach:  01/14/22 at 1030.

## 2021-12-08 NOTE — Patient Instructions (Signed)
Hey Mr. Baby, I am glad you are doing well-have a nice afternoon!!  Mr. Knick was given information about Medicaid Managed Care team care coordination services as a part of their [place] Medicaid benefit. Cortavius Montesinos verbally consented to engagement with the [facility] Managed Care team.   If you are experiencing a medical emergency, please call 911 or report to your local emergency department or urgent care.   If you have a non-emergency medical problem during routine business hours, please contact your provider's office and ask to speak with a nurse.   For questions related to your [facility], please call: [phone] or visit the homepage here: [email or web address]  If you would like to schedule transportation through your [facility], please call the following number at least 2 days in advance of your appointment: [phone]   Rides for urgent appointments can also be made after hours by calling Member Services.  Call the [place] at [phone], at any time, 24 hours a day, 7 days a week. If you are in danger or need immediate medical attention call 911.  If you would like help to quit smoking, call 1-800-QUIT-NOW (818) 406-5507) OR Espaol: 1-855-Djelo-Ya (4-431-540-0867) o para ms informacin haga clic aqu or Text READY to 200-400 to register via text  Mr. Maxson - following are the goals we discussed in your visit today:   Goals Addressed             This Visit's Progress    Monitor and Manage My Blood Sugar-Diabetes Type 2       Timeframe:  Long-Range Goal Priority:  High Start Date:        06/03/20                     Expected End Date:   ongoing               Follow Up Date: 01/14/22   - check blood sugar at prescribed times - check blood sugar if I feel it is too high or too low - enter blood sugar  readings and medication or insulin into my phone log - take the blood sugar log to all doctor visits - take the blood sugar meter to all doctor visits    Why is this important?   Checking your blood sugar at home helps to keep it from getting very high or very low.  Writing the results in a diary or log helps the doctor know how to care for you.  Your blood sugar log should have the time, date and the results.  Also, write down the amount of insulin or other medicine that you take.  Other information, like what you ate, exercise done and how you were feeling, will also be helpful.     Notes:-  12/08/21:  Patient states he does not have meter-to follow up with provider.     Protect My Health       Timeframe:  Long-Range Goal Priority:  Medium Start Date:      10/07/20                       Expected End Date:  ongoing                Follow Up Date: 01/14/22   - schedule appointment for flu shot - schedule appointment for vaccines needed due to my age or health -  schedule recommended health tests. - schedule and keep appointment for annual check-up   Why is this important?   Screening tests can find diseases early when they are easier to treat.  Your doctor or nurse will talk with you about which tests are important for you.  Getting shots for common diseases like the flu and shingles will help prevent them.   12/08/21:  No upcoming appointments    Track and Manage Fluids and Swelling-Heart Failure       Timeframe:  Long-Range Goal Priority:  High Start Date:          01/14/20                   Expected End Date:   ongoing     Follow Up Date: 01/14/22   - call office if I gain more than 2 pounds in one day or 5 pounds in one week - do ankle pumps when sitting - use salt in moderation - watch for swelling in feet, ankles and legs every day - weigh myself daily  - log weight in my phone   Why is this important?   It is important to check your weight daily and watch how much salt  and liquids you have.  It will help you to manage your heart failure.    Notes: 12/08/21:  patient states decreased swelling with compression stockings     Track and Manage My Blood Pressure-Hypertension       Timeframe:  Long-Range Goal Priority:  High Start Date:     06/03/20                        Expected End Date:  ongoing                 Follow Up Date: 01/14/22  - check blood pressure daily - write blood pressure results in my phone log    Why is this important?   You won't feel high blood pressure, but it can still hurt your blood vessels.  High blood pressure can cause heart or kidney problems. It can also cause a stroke.  Making lifestyle changes like losing a little weight or eating less salt will help.  Checking your blood pressure at home and at different times of the day can help to control blood pressure.  If the doctor prescribes medicine remember to take it the way the doctor ordered.  Call the office if you cannot afford the medicine or if there are questions about it 12/08/21:  patient states he checks BP daily-120s/80s   Patient verbalizes understanding of instructions and care plan provided today and agrees to view in [place]. Active MyChart status and patient understanding of how to access instructions and care plan via MyChart confirmed with patient.     The Managed Medicaid care management team will reach out to the patient again over the next 30 business  days.  The  Patient  has been provided with contact information for the Managed Medicaid care management team and has been advised to call with any health related questions or concerns.   [name] RN, BSN [place] Management Coordinator - Managed Medicaid High Risk [phone]   Following is a copy of your plan of care:  Care Plan : CCM RN- Diabetes Type 2 (Adult)  Updates made by [name], RN since 12/08/2021 12:00 AM     Problem: Glycemic Management  (  Diabetes, Type 2)   Priority: High  Onset Date: 06/03/2020     Long-Range Goal: Glycemic Management Optimized   Start Date: 07/20/2019  Expected End Date: 03/10/2022  Recent Progress: Not on track  Priority: High  Note:    CARE PLAN ENTRY (see longitudinal plan of care for additional care plan information)  Current Barriers:  Knowledge Deficits related to basic Diabetes pathophysiology and self care/management Knowledge Deficits related to medications used for management of diabetes 12/08/21:  Patient not checking blood sugars, to follow up with provider regarding new meter. Case Manager Clinical Goal(s):  Over the next 30-60  days, patient will demonstrate improved adherence to prescribed treatment plan for diabetes self care/management as evidenced by:  daily monitoring and recording of CBG  adherence to ADA/ carb modified diet adherence to prescribed medication regimen  Interventions:  Reviewed education with patient about basic DM disease process Reviewed medications and assessed medication taking behavior Ensured patient has ample supply of all prescribed medications Advised patient, providing education and rationale, to check CBG at least daily and record, calling provider and/or CCM RN for findings outside established parameters. Educated patient that leg and foot pain will likely decrease if blood sugar control is improved and a CGM could provide that assistance  Discussed plans with patient for ongoing care management follow up and provided patient with direct contact information for care management team Review of patient status, including review of consultants reports, relevant laboratory and other test results, and medications completed.  Patient Self Care Activities:  UNABLE to independently self manage DM Self administers oral medications as prescribed Self administers insulin as prescribed Self administers injectable DM medication Xultophy as prescribed Attends all  scheduled provider appointments Checks blood sugars as prescribed and utilize hyper and hypoglycemia protocol as needed Adheres to prescribed ADA/carb modified check blood sugar at prescribed times - check blood sugar if I feel it is too high or too low - enter blood sugar readings and medication or insulin into my phone log - take the blood sugar log to all doctor visits - take the blood sugar meter to all doctor visits    Care Plan : CCM RN- Chronic Pain (Adult)- lower extremeties  Updates made by [name], RN since 12/08/2021 12:00 AM     Problem: Chronic Pain Management (Chronic Pain)   Priority: High  Onset Date: 07/15/2020     Long-Range Goal: Chronic Pain Managed   Start Date: 07/15/2020  Expected End Date: 03/10/2022  Recent Progress: On track  Priority: High  Note:   Current Barriers:  Knowledge Deficits related to self-health management of chronic lower extremity pain Chronic Disease Management support and education needs related to chronic pain 12/08/21:  Patient with no complaint today-wearing compression stockings helps his foot pain Clinical Goal(s):  patient will verbalize understanding of plan for pain management.  and patient will use pharmacological and nonpharmacological pain relief strategies as prescribed.  Interventions:  Pain assessment performed Medications reviewed Discussed plans with patient for ongoing care management follow up and provided patient with direct contact information for care management team Discussed improvement  in DM control will likely result in less pain in legs and feet  Patient Goals/Self Care Activities:  Will self-administer medications as prescribed Will attend all scheduled provider appointments Will call pharmacy for medication refills 7 days prior to needed refill date Patient will calls provider office for new concerns or questions Patient will get appt scheduled with general surgeon Follow Up Plan: The care  management  team will reach out to the patient again over the next 30-60 days.    Care Plan : General Plan of Care (Adult)  Updates made by [name], RN since 12/08/2021 12:00 AM     Problem: Health Promotion or Disease Self-Management (General Plan of Care)   Priority: Medium  Onset Date: 10/07/2020     Long-Range Goal: Self-Management Plan Developed   Start Date: 10/07/2020  Expected End Date: 02/02/2022  Recent Progress: On track  Priority: Medium  Note:   Current Barriers:  Ineffective Self Health Maintenance Currently UNABLE TO independently self manage needs related to chronic health conditions.  Knowledge Deficits related to short term plan for care coordination needs and long term plans for chronic disease management needs 12/08/21:  Patient has no complaints today-checking BP daily and WNL, not checking blood sugar as needs meter-patient states he will follow up.  Weight, swelling, and pain controlled Nurse Case Manager Clinical Goal(s):  patient will work with care management team to address care coordination and chronic disease management needs related to Disease Management Educational Needs Care Coordination Medication Management and Education Medication Reconciliation Medication Assistance  Psychosocial Support   Interventions:  Evaluation of current treatment plan and patient's adherence to plan as established by provider. Reviewed medications with patient. Collaborated with pharmacy regarding referrals. Collaborated with PCP for referrals-completed Collaborated with Care Guide for eyeglass provider that accepts patient's insurance-completed Care Guide referral for eyeglass provider-completed Discussed plans with patient for ongoing care management follow up and provided patient with direct contact information for care management team Advised patient, providing education and rationale, to monitor blood pressure daily and record, calling provider for findings outside  established parameters.  Reviewed scheduled/upcoming provider appointments. Collaborated with PCP for general surgeon and podiatry referral-completed Advised patient, providing education and rationale, to check cbg and record, calling provider for findings outside established parameters.   Pharmacy referral for medication review. Advised patient to contact insurance company regarding resumption of aide service-completed Care Guide referral for first time home buyer resources-completed Collaborated with Care Guide for first time home buyer resources Self Care Activities:  Patient will self administer medications as prescribed Patient will attend all scheduled provider appointments Patient will call pharmacy for medication refills Patient will continue to perform ADL's independently Patient will call provider office for new concerns or questions Patient Goals:. In the next 30 days, patient will attend all appointments. In the next 30 days, patient will follow up on meter. - Follow Up Plan: The patient has been provided with contact information for the care management team and has been advised to call with any health related questions or concerns.  The care management team will reach out to the patient again over the next 30 business  days.

## 2021-12-14 DIAGNOSIS — R32 Unspecified urinary incontinence: Secondary | ICD-10-CM | POA: Diagnosis not present

## 2021-12-14 DIAGNOSIS — I10 Essential (primary) hypertension: Secondary | ICD-10-CM | POA: Diagnosis not present

## 2021-12-14 DIAGNOSIS — E119 Type 2 diabetes mellitus without complications: Secondary | ICD-10-CM | POA: Diagnosis not present

## 2021-12-17 ENCOUNTER — Other Ambulatory Visit: Payer: Self-pay | Admitting: Student

## 2021-12-17 DIAGNOSIS — E1142 Type 2 diabetes mellitus with diabetic polyneuropathy: Secondary | ICD-10-CM

## 2022-01-07 ENCOUNTER — Telehealth: Payer: Self-pay

## 2022-01-07 NOTE — Telephone Encounter (Signed)
PA came through on cover my meds for patient Terry Parks) was submitted with last office notes awaiting approval or denial.

## 2022-01-07 NOTE — Telephone Encounter (Signed)
[name] (KeyLaverta [place]) Rx #: [id number] Xultophy 100/3.6 Units-mg/mL  Form [employer] Electronic Prior Authorization Form [phone] NCPDP) Message from Plan Request Reference Number: [id number]. XULTOPHY INJ 100/3.6 is denied for not meeting the prior authorization requirement(s). For further questions, call [place] at [phone] for more information.

## 2022-01-08 DIAGNOSIS — I10 Essential (primary) hypertension: Secondary | ICD-10-CM | POA: Diagnosis not present

## 2022-01-08 DIAGNOSIS — E119 Type 2 diabetes mellitus without complications: Secondary | ICD-10-CM | POA: Diagnosis not present

## 2022-01-08 DIAGNOSIS — R32 Unspecified urinary incontinence: Secondary | ICD-10-CM | POA: Diagnosis not present

## 2022-01-14 ENCOUNTER — Other Ambulatory Visit: Payer: Self-pay | Admitting: Obstetrics and Gynecology

## 2022-01-14 NOTE — Telephone Encounter (Signed)
Received call from [place] at [place] regarding PA for [place]. Explained the PA was denied. PA staff will print out Appeal Form for PCP to complete. Caroline More is asking if patient should be on another med in the interim. Please advise.

## 2022-01-14 NOTE — Patient Outreach (Signed)
Medicaid Managed Care   Nurse Care Manager Note  01/14/2022 Name:  Terry Parks MRN:  [id number] DOB:  June 13, 1955  Terry Parks is an 66 y.o. year old male who is a primary Terry Parks of Terry Beard, MD.  [facility] Managed Care Coordination team was consulted for assistance with:    Chronic healthcare management needs, HTN, DM, HF, chronic pain, OSA, vision loss, PAF, HLD, PAF  Mr. Reali was given information about Medicaid Managed Care Coordination team services today. Terry Parks Terry Parks agreed to services and verbal consent obtained.  Engaged with Terry Parks by telephone for follow up visit in response to provider referral for case management and/or care coordination services.   Assessments/Interventions:  Review of past medical history, allergies, medications, health status, including review of consultants reports, laboratory and other test data, was performed as part of comprehensive evaluation and provision of chronic care management services.  SDOH (Social Determinants of Health) assessments and interventions performed: SDOH Interventions    Flowsheet Row Most Recent Value  SDOH Interventions   Physical Activity Interventions Intervention Not Indicated      Care Plan  Allergies  Allergen Reactions   Lisinopril Cough    Medications Reviewed Today     Reviewed by [name], RN (Registered Nurse) on 01/14/22 at 1041  Med List Status: <None>   Medication Order Taking? Sig Documenting Provider Last Dose Status Informant  ACCU-CHEK GUIDE test strip [id number]  USE TO check blood glucose THREE TIMES DAILY AS DIRECTED Terry Beard, MD  Active   Accu-Chek Softclix Lancets lancets [id number]  Check blood sugar three times a day as instructed [name], MD  Active   amiodarone (PACERONE) 200 MG tablet [id number]  TAKE ONE TABLET BY MOUTH EVERY MORNING [name], [name], MD  Active   amLODipine (NORVASC) 5 MG tablet [id number] No TAKE ONE TABLET BY MOUTH  EVERY EVENING [name], [name], MD Taking Active   amoxicillin (AMOXIL) 500 MG capsule [id number]  Take 1 capsule (500 mg total) by mouth every 6 (six) hours until gone.   Active   atorvastatin (LIPITOR) 40 MG tablet [id number] No TAKE ONE TABLET BY MOUTH EVERY EVENING [name], [name], MD Taking Active   Blood Glucose Monitoring Suppl (ACCU-CHEK GUIDE) w/Device KIT [id number] No 1 Units by Does not apply route 3 (three) times daily. Terry Beard, MD Taking Active   Blood Glucose Monitoring Suppl (BLOOD GLUCOSE MONITOR SYSTEM) w/Device KIT [id number]  Use up to 4 (four) times daily as directed [name], MD  Active   carvedilol (COREG) 25 MG tablet [id number] No TAKE ONE TABLET BY MOUTH EVERY MORNING and TAKE ONE TABLET BY MOUTH EVERY EVENING [name], [name], MD Taking Active   COMFORT EZ PEN NEEDLES 31G X 6 MM [place] [id number] No USE AS DIRECTED AT BEDTIME Terry Beard, MD Taking Active   COMFORT EZ PEN NEEDLES 32G X 6 MM [place] [id number] No  [provider] Taking Active   DULoxetine (CYMBALTA) 60 MG capsule [id number]  Take 1 capsule (60 mg total) by mouth daily. Terry Beard, MD  Active   empagliflozin (JARDIANCE) 25 MG TABS tablet [id number]  TAKE 1 TABLET (25 MG TOTAL) BY MOUTH DAILY BEFORE BREAKFAST. Terry Beard, MD  Active   esomeprazole (NEXIUM) 40 MG capsule [id number] No Take 1 capsule (40 mg total) by mouth 2 (two) times daily before a meal. Terry, [name], MD Taking Active   ezetimibe (ZETIA) 10 MG tablet [id number] No Take 1 tablet (10 mg total) by mouth daily. [name],  [name], MD Taking Expired 09/30/21 2359   Insulin Degludec-Liraglutide (XULTOPHY) 100-3.6 UNIT-MG/ML SOPN [id number]  Inject 52 Units into the skin daily. Terry Duff, MD  Active   Ipratropium-Albuterol (COMBIVENT RESPIMAT) 20-100 MCG/ACT AERS respimat [id number]  Inhale 1 puff into the lungs every 6 (six) hours. [name], MD  Active   rivaroxaban (XARELTO) 15 MG TABS tablet [id number]   Take 1 tablet (15 mg total) by mouth daily with breakfast. [name], [name], MD  Active   sacubitril-valsartan (ENTRESTO) 97-103 MG [id number] No TAKE ONE TABLET BY MOUTH EVERY MORNING and TAKE ONE TABLET BY MOUTH EVERY EVENING [name], [name], MD Taking Active   spironolactone (ALDACTONE) 50 MG tablet [id number] No TAKE ONE TABLET BY MOUTH EVERY MORNING [name], [name], MD Taking Active   sucralfate (CARAFATE) 1 GM/10ML suspension [id number] No Take 10 mLs (1 g total) by mouth 4 (four) times daily.  Terry Parks not taking: Reported on 09/30/2021   Terry, [name], MD Not Taking Active   torsemide (DEMADEX) 20 MG tablet [id number] No TAKE TWO TABLETS BY MOUTH EVERY MORNING [name], [name], MD Taking Active            Terry Parks Active Problem List   Diagnosis Date Noted   Recurrent abdominal hernia without obstruction or gangrene 05/22/2021   Pyrosis 05/22/2021   Indigestion 16/60/6004   Periumbilical pain 59/97/7414   Abdominal pain, epigastric 05/22/2021   Class 3 obesity ([place]) 11/10/2020   S/P TURP 11/10/2020   History of iron deficiency anemia 12/16/2019   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   Depression 01/31/2019   Iron deficiency anemia 04/28/2018   CKD stage 3 due to type 2 diabetes mellitus ([place]) 02/11/2018   Healthcare maintenance 02/11/2018   Persistent atrial fibrillation    Vitamin D deficiency 06/22/2016   Allergic rhinitis 10/19/2012   Diabetic retinopathy ([place]) 10/04/2012   Type 2 diabetes mellitus with peripheral neuropathy ([place]) 04/08/2008   Obstructive sleep apnea    Morbid obesity ([place])    Hyperlipidemia associated with type 2 diabetes mellitus ([place])    Hypertension associated with diabetes ([place])    Chronic combined systolic and diastolic CHF (congestive heart failure) ([place]) 03/01/2006   Conditions to be addressed/monitored per PCP order:  Chronic healthcare management needs, HTN, DM, HF, chronic pain, OSA, vision loss, PAF, HLD, PAF  Care Plan : CCM RN-  Diabetes Type 2 (Adult)  Updates made by [name], RN since 01/14/2022 12:00 AM     Problem: Glycemic Management (Diabetes, Type 2)   Priority: High  Onset Date: 06/03/2020     Long-Range Goal: Glycemic Management Optimized   Start Date: 07/20/2019  Expected End Date: 03/10/2022  Recent Progress: Not on track  Priority: High  Note:    CARE PLAN ENTRY (see longitudinal plan of care for additional care plan information)  Current Barriers:  Knowledge Deficits related to basic Diabetes pathophysiology and self care/management Knowledge Deficits related to medications used for management of diabetes 01/14/22:  Terry Parks states he has been without his insulin for 1 month due to problems with insurance-has spoken with [facility].  Not checking blood sugars as needs to get new meter.  Call made to [facility] today with Terry Parks on the other line to inquire-[facility] states they will follow up today with PCP and communicate with Terry Parks the plan for insulin. Case Manager Clinical Goal(s):  Over the next 30-60  days, Terry Parks will demonstrate improved adherence to prescribed treatment plan for diabetes self care/management as evidenced by:  daily monitoring  and recording of CBG  adherence to ADA/ carb modified diet adherence to prescribed medication regimen  Interventions:  Reviewed education with Terry Parks about basic DM disease process Reviewed medications and assessed medication taking behavior Ensured Terry Parks has ample supply of all prescribed medications Advised Terry Parks, providing education and rationale, to check CBG at least daily and record, calling provider and/or CCM RN for findings outside established parameters. Educated Terry Parks that leg and foot pain will likely decrease if blood sugar control is improved and a CGM could provide that assistance  Discussed plans with Terry Parks for ongoing care management follow up and provided Terry Parks with direct contact information for care management  team Review of Terry Parks status, including review of consultants reports, relevant laboratory and other test results, and medications completed. Collaborated with [facility] for insulin.  Terry Parks Self Care Activities:  UNABLE to independently self manage DM Self administers oral medications as prescribed Self administers insulin as prescribed Self administers injectable DM medication Xultophy as prescribed Attends all scheduled provider appointments Checks blood sugars as prescribed and utilize hyper and hypoglycemia protocol as needed Adheres to prescribed ADA/carb modified check blood sugar at prescribed times - check blood sugar if I feel it is too high or too low - enter blood sugar readings and medication or insulin into my phone log - take the blood sugar log to all doctor visits - take the blood sugar meter to all doctor visits     Care Plan : CCM RN- Chronic Pain (Adult)- lower extremeties  Updates made by [name], RN since 01/14/2022 12:00 AM     Problem: Chronic Pain Management (Chronic Pain)   Priority: High  Onset Date: 07/15/2020     Long-Range Goal: Chronic Pain Managed   Start Date: 07/15/2020  Expected End Date: 03/10/2022  Recent Progress: On track  Priority: High  Note:   Current Barriers:  Knowledge Deficits related to self-health management of chronic lower extremity pain Chronic Disease Management support and education needs related to chronic pain 01/14/22:  Terry Parks with no complaint today-continues to wear compression stockings. Clinical Goal(s):  Terry Parks will verbalize understanding of plan for pain management.  and Terry Parks will use pharmacological and nonpharmacological pain relief strategies as prescribed.  Interventions:  Pain assessment performed Medications reviewed Discussed plans with Terry Parks for ongoing care management follow up and provided Terry Parks with direct contact information for care management team Discussed improvement  in DM control  will likely result in less pain in legs and feet  Terry Parks Goals/Self Care Activities:  Will self-administer medications as prescribed Will attend all scheduled provider appointments Will call pharmacy for medication refills 7 days prior to needed refill date Terry Parks will calls provider office for new concerns or questions Terry Parks will get appt scheduled with general surgeon Follow Up Plan: The care management team will reach out to the Terry Parks again over the next 30-60 days.    Care Plan : General Plan of Care (Adult)  Updates made by [name], RN since 01/14/2022 12:00 AM     Problem: Health Promotion or Disease Self-Management (General Plan of Care)   Priority: Medium  Onset Date: 10/07/2020     Long-Range Goal: Self-Management Plan Developed   Start Date: 10/07/2020  Expected End Date: 04/16/2022  Recent Progress: On track  Priority: Medium  Note:   Current Barriers:  Ineffective Self Health Maintenance Currently UNABLE TO independently self manage needs related to chronic health conditions.  Knowledge Deficits related to short term plan for care coordination needs  and long term plans for chronic disease management needs 01/14/22:  Terry Parks without insulin for 1 month-[facility] to communicate  with PCP today and f/u with Terry Parks.  Terry Parks to schedule an appt with surgeon for hernia  f/u and contact [facility] for aide. Nurse Case Manager Clinical Goal(s):  Terry Parks will work with care management team to address care coordination and chronic disease management needs related to Disease Management Educational Needs Care Coordination Medication Management and Education Medication Reconciliation Medication Assistance  Psychosocial Support   Interventions:  Evaluation of current treatment plan and Terry Parks's adherence to plan as established by provider. Reviewed medications with Terry Parks. Collaborated with pharmacy regarding referrals. Collaborated with PCP for  referrals-completed Collaborated with Care Guide for eyeglass provider that accepts Terry Parks's insurance-completed Care Guide referral for eyeglass provider-completed Discussed plans with Terry Parks for ongoing care management follow up and provided Terry Parks with direct contact information for care management team Advised Terry Parks, providing education and rationale, to monitor blood pressure daily and record, calling provider for findings outside established parameters.  Reviewed scheduled/upcoming provider appointments. Collaborated with PCP for general surgeon and podiatry referral-completed Advised Terry Parks, providing education and rationale, to check cbg and record, calling provider for findings outside established parameters.   Pharmacy referral for medication review. Advised Terry Parks to contact insurance company regarding resumption of aide service-completed Care Guide referral for first time home buyer resources-completed Collaborated with Care Guide for first time home buyer resources Collaborated with [facility] Self Care Activities:  Terry Parks will self administer medications as prescribed Terry Parks will attend all scheduled provider appointments Terry Parks will call pharmacy for medication refills Terry Parks will continue to perform ADL's independently Terry Parks will call provider office for new concerns or questions Terry Parks Goals:. In the next 30 days, Terry Parks will attend all appointments. In the next 30 days, Terry Parks will follow up on meter. In the next 30 days, Terry Parks will call insurance company and surgeon to schedule an appt. - Follow Up Plan: The Terry Parks has been provided with contact information for the care management team and has been advised to call with any health related questions or concerns.  The care management team will reach out to the Terry Parks again over the next 30 business  days.     Follow Up:  Terry Parks agrees to Care Plan and Follow-up.  Plan: The Managed Medicaid care  management team will reach out to the Terry Parks again over the next 30 business  days. and The  Terry Parks has been provided with contact information for the Managed Medicaid care management team and has been advised to call with any health related questions or concerns.  Date/time of next scheduled RN care management/care coordination outreach:  02/16/22 at [phone].

## 2022-01-14 NOTE — Telephone Encounter (Signed)
PA appeal form has been printed and placed in the doctors box to be completed.

## 2022-01-14 NOTE — Patient Instructions (Signed)
Hey Terry Parks, thanks for speaking with me-have a great day!!  Terry Parks was given information about Medicaid Managed Care team care coordination services as a part of their [place] Medicaid benefit. Terry Parks verbally consented to engagement with the [facility] Managed Care team.   If you are experiencing a medical emergency, please call 911 or report to your local emergency department or urgent care.   If you have a non-emergency medical problem during routine business hours, please contact your provider's office and ask to speak with a nurse.   For questions related to your [facility], please call: [phone] or visit the homepage here: [email or web address]  If you would like to schedule transportation through your [facility], please call the following number at least 2 days in advance of your appointment: [phone]   Rides for urgent appointments can also be made after hours by calling Member Services.  Call the [place] at [phone], at any time, 24 hours a day, 7 days a week. If you are in danger or need immediate medical attention call 911.  If you would like help to quit smoking, call 1-800-QUIT-NOW 8785073958) OR Espaol: 1-855-Djelo-Ya (1-324-401-0272) o para ms informacin haga clic aqu or Text READY to 200-400 to register via text  Terry Parks - following are the goals we discussed in your visit today:   Goals Addressed             This Visit's Progress    Monitor and Manage My Blood Sugar-Diabetes Type 2       Timeframe:  Long-Range Goal Priority:  High Start Date:        06/03/20                     Expected End Date:   ongoing               Follow Up Date:02/16/22   - check blood sugar at prescribed times - check blood sugar if I feel it is too high or too low - enter blood sugar readings and  medication or insulin into my phone log - take the blood sugar log to all doctor visits - take the blood sugar meter to all doctor visits    Why is this important?   Checking your blood sugar at home helps to keep it from getting very high or very low.  Writing the results in a diary or log helps the doctor know how to care for you.  Your blood sugar log should have the time, date and the results.  Also, write down the amount of insulin or other medicine that you take.  Other information, like what you ate, exercise done and how you were feeling, will also be helpful.     Notes:-  01/14/22:  Patient not checking blood sugars-states he needs to get a new meter.     Protect My Health       Timeframe:  Long-Range Goal Priority:  Medium Start Date:      10/07/20                       Expected End Date:  ongoing                Follow Up Date: 02/16/22   - schedule appointment for flu shot - schedule appointment for vaccines needed due to my age or health - schedule recommended  health tests. - schedule and keep appointment for annual check-up   Why is this important?   Screening tests can find diseases early when they are easier to treat.  Your doctor or nurse will talk with you about which tests are important for you.  Getting shots for common diseases like the flu and shingles will help prevent them.   01/14/22: Patient has appt with PCP next month.  To call surgeon and schedule appt for hernia f/u.    Track and Manage Fluids and Swelling-Heart Failure       Timeframe:  Long-Range Goal Priority:  High Start Date:          01/14/20                   Expected End Date:   ongoing     Follow Up Date: 02/16/22   - call office if I gain more than 2 pounds in one day or 5 pounds in one week - do ankle pumps when sitting - use salt in moderation - watch for swelling in feet, ankles and legs every day - weigh myself daily  - log weight in my phone   Why is this important?   It is  important to check your weight daily and watch how much salt and liquids you have.  It will help you to manage your heart failure.    Notes: 01/14/22:  Patient wears compression socks every day, no issues today.     Track and Manage My Blood Pressure-Hypertension       Timeframe:  Long-Range Goal Priority:  High Start Date:     06/03/20                        Expected End Date:  ongoing                 Follow Up Date: 02/16/22  - check blood pressure daily - write blood pressure results in my phone log    Why is this important?   You won't feel high blood pressure, but it can still hurt your blood vessels.  High blood pressure can cause heart or kidney problems. It can also cause a stroke.  Making lifestyle changes like losing a little weight or eating less salt will help.  Checking your blood pressure at home and at different times of the day can help to control blood pressure.  If the doctor prescribes medicine remember to take it the way the doctor ordered.  Call the office if you cannot afford the medicine or if there are questions about it 01/14/22:  Patient currently not checking BP-states he needs to get a new battery.   Patient verbalizes understanding of instructions and care plan provided today and agrees to view in [name]. Active MyChart status and patient understanding of how to access instructions and care plan via MyChart confirmed with patient.     The Managed Medicaid care management team will reach out to the patient again over the next 30 business  days.  The  Patient   has been provided with contact information for the Managed Medicaid care management team and has been advised to call with any health related questions or concerns.   [name] RN, BSN [place]  [name] - Managed Medicaid High Risk [phone].   Following is a copy of your plan of care:  Care Plan : CCM RN- Diabetes Type 2 (  Adult)  Updates made  by [name], RN since 01/14/2022 12:00 AM     Problem: Glycemic Management (Diabetes, Type 2)   Priority: High  Onset Date: 06/03/2020     Long-Range Goal: Glycemic Management Optimized   Start Date: 07/20/2019  Expected End Date: 03/10/2022  Recent Progress: Not on track  Priority: High  Note:    CARE PLAN ENTRY (see longitudinal plan of care for additional care plan information)  Current Barriers:  Knowledge Deficits related to basic Diabetes pathophysiology and self care/management Knowledge Deficits related to medications used for management of diabetes 01/14/22:  Patient states he has been without his insulin for 1 month due to problems with insurance-has spoken with [facility].  Not checking blood sugars as needs to get new meter.  Call made to [facility] today with patient on the other line to inquire-[facility] states they will follow up today with PCP and communicate with patient the plan for insulin. Case Manager Clinical Goal(s):  Over the next 30-60  days, patient will demonstrate improved adherence to prescribed treatment plan for diabetes self care/management as evidenced by:  daily monitoring and recording of CBG  adherence to ADA/ carb modified diet adherence to prescribed medication regimen  Interventions:  Reviewed education with patient about basic DM disease process Reviewed medications and assessed medication taking behavior Ensured patient has ample supply of all prescribed medications Advised patient, providing education and rationale, to check CBG at least daily and record, calling provider and/or CCM RN for findings outside established parameters. Educated patient that leg and foot pain will likely decrease if blood sugar control is improved and a CGM could provide that assistance  Discussed plans with patient for ongoing care management follow up and provided patient with direct contact information for care management team Review of patient status, including  review of consultants reports, relevant laboratory and other test results, and medications completed. Collaborated with [facility] for insulin.  Patient Self Care Activities:  UNABLE to independently self manage DM Self administers oral medications as prescribed Self administers insulin as prescribed Self administers injectable DM medication Xultophy as prescribed Attends all scheduled provider appointments Checks blood sugars as prescribed and utilize hyper and hypoglycemia protocol as needed Adheres to prescribed ADA/carb modified check blood sugar at prescribed times - check blood sugar if I feel it is too high or too low - enter blood sugar readings and medication or insulin into my phone log - take the blood sugar log to all doctor visits - take the blood sugar meter to all doctor visits    Care Plan : CCM RN- Chronic Pain (Adult)- lower extremeties  Updates made by [name], RN since 01/14/2022 12:00 AM     Problem: Chronic Pain Management (Chronic Pain)   Priority: High  Onset Date: 07/15/2020     Long-Range Goal: Chronic Pain Managed   Start Date: 07/15/2020  Expected End Date: 03/10/2022  Recent Progress: On track  Priority: High  Note:   Current Barriers:  Knowledge Deficits related to self-health management of chronic lower extremity pain Chronic Disease Management support and education needs related to chronic pain 01/14/22:  Patient with no complaint today-continues to wear compression stockings. Clinical Goal(s):  patient will verbalize understanding of plan for pain management.  and patient will use pharmacological and nonpharmacological pain relief strategies as prescribed.  Interventions:  Pain assessment performed Medications reviewed Discussed plans with patient for ongoing care management follow up and provided patient with direct contact information for care  management team Discussed improvement  in DM control will likely result in less pain in legs and  feet  Patient Goals/Self Care Activities:  Will self-administer medications as prescribed Will attend all scheduled provider appointments Will call pharmacy for medication refills 7 days prior to needed refill date Patient will calls provider office for new concerns or questions Patient will get appt scheduled with general surgeon Follow Up Plan: The care management team will reach out to the patient again over the next 30-60 days.    Care Plan : General Plan of Care (Adult)  Updates made by [name], RN since 01/14/2022 12:00 AM     Problem: Health Promotion or Disease Self-Management (General Plan of Care)   Priority: Medium  Onset Date: 10/07/2020     Long-Range Goal: Self-Management Plan Developed   Start Date: 10/07/2020  Expected End Date: 04/16/2022  Recent Progress: On track  Priority: Medium  Note:   Current Barriers:  Ineffective Self Health Maintenance Currently UNABLE TO independently self manage needs related to chronic health conditions.  Knowledge Deficits related to short term plan for care coordination needs and long term plans for chronic disease management needs 01/14/22:  Patient without insulin for 1 month-[facility] to communicate  with PCP today and f/u with patient.  Patient to schedule an appt with surgeon for hernia  f/u and contact [place] for aide. Nurse Case Manager Clinical Goal(s):  patient will work with care management team to address care coordination and chronic disease management needs related to Disease Management Educational Needs Care Coordination Medication Management and Education Medication Reconciliation Medication Assistance  Psychosocial Support   Interventions:  Evaluation of current treatment plan and patient's adherence to plan as established by provider. Reviewed medications with patient. Collaborated with pharmacy regarding referrals. Collaborated with PCP for referrals-completed Collaborated with Care Guide for eyeglass provider  that accepts patient's insurance-completed Care Guide referral for eyeglass provider-completed Discussed plans with patient for ongoing care management follow up and provided patient with direct contact information for care management team Advised patient, providing education and rationale, to monitor blood pressure daily and record, calling provider for findings outside established parameters.  Reviewed scheduled/upcoming provider appointments. Collaborated with PCP for general surgeon and podiatry referral-completed Advised patient, providing education and rationale, to check cbg and record, calling provider for findings outside established parameters.   Pharmacy referral for medication review. Advised patient to contact insurance company regarding resumption of aide service-completed Care Guide referral for first time home buyer resources-completed Collaborated with Care Guide for first time home buyer resources Collaborated with [facility] Self Care Activities:  Patient will self administer medications as prescribed Patient will attend all scheduled provider appointments Patient will call pharmacy for medication refills Patient will continue to perform ADL's independently Patient will call provider office for new concerns or questions Patient Goals:. In the next 30 days, patient will attend all appointments. In the next 30 days, patient will follow up on meter. In the next 30 days, patient will call insurance company and surgeon to schedule an appt. - Follow Up Plan: The patient has been provided with contact information for the care management team and has been advised to call with any health related questions or concerns.  The care management team will reach out to the patient again over the next 30 business  days.

## 2022-01-26 NOTE — Progress Notes (Unsigned)
CC: DMII follow up  HPI:  [name] is a 66 y.o. with medical history of HTN, HLD, DMII, Combined HF, MDD, BPH presenting to [facility] for a follow up DMII. Patient was here for a follow up on 10/2021.  Please see problem-based list for further details, assessments, and plans.  Past Medical History:  Diagnosis Date   Abscessed tooth    top back large cavity no pain or drainage, one on bottom  pt pulled tooth 4-5 months ago, right top large hole in tooth   AKI (acute kidney injury) ([place])    Allergic rhinitis    Anemia    Anxiety    Asthma    Atrial fibrillation (HCC)    BPH (benign prostatic hypertrophy)    Massive BPH noted on cystoscopy 1/23/ 2012 by Dr. Risa Grill.   Cancer [facility])    prostate cancer 2019   Cardiomyopathy [facility])    CHF (congestive heart failure) ([place])    Cough 03/30/2012   Depression    Diabetes mellitus 04/08/2008   type 2   Dyspnea    Dysrhythmia 2019   Foley catheter in place 07-05-17 placed   Fracture, orbital ([place]) 2021   Right   GERD (gastroesophageal reflux disease) 2020   Headache(784.0)    hx migraines none recent   History of esophagitis 12/16/2019   Hyperlipemia    Hypertension    Hypertensive cardiopathy 03/01/2006   2-D echocardiogram 02/01/2012 showed moderate LVH, mildly to moderately reduced left ventricular systolic function with an estimated ejection fraction of 40-45%, and diffuse hypokinesis.  A nuclear medicine stress study done 01/31/2012 showed no reversible ischemia, a small mid anterior wall fixed defect/infarct, and ejection fraction 42%.       Neck pain    Nephrolithiasis 05/29/2010   CT scan of abdomen/pelvis on 05/29/2010 showed an obstructing approximate 1-2 mm calculus at the left UVJ, and an approximate 1-2 mm left lower pole renal calculus.   Patient had continuing severe pain , and an elevation of his serum creatinine to a value of 1.75 on 06/06/2010.  Patient underwent cystoscopy on 06/08/2010 by Dr. Risa Grill, but attempts at  retrograde pyelogram and ureteroscopy were unsucc   Numbness 01/08/2018   Obstructive sleep apnea 03/06/2008   Sleep study 03/06/08 showed severe OSA/hypopnea syndrome, with successful CPAP titration to 13 CWP using a medium ResMed Mirage Quattro full face mask with heated humidifier.    Rash 04/17/2014   Renal calculus 05/29/2010   CT scan of abdomen/pelvis on 05/29/2010 showed an obstructing approximate 1-2 mm calculus at the left UVJ, and an approximate 1-2 mm left lower pole renal calculus.   Patient had continuing severe pain , and an elevation of his serum creatinine to a value of 1.75 on 06/06/2010.  The stone had apparently passed and was not seen on repeat CT 06/08/2010.   Sleep apnea    haven't use cpap in 2 years   Tooth pain 10/29/2017   Urinary straining 11/02/2016    Current Outpatient Medications (Endocrine & Metabolic):    empagliflozin (JARDIANCE) 25 MG TABS tablet, TAKE 1 TABLET (25 MG TOTAL) BY MOUTH DAILY BEFORE BREAKFAST.   Insulin Degludec-Liraglutide (XULTOPHY) 100-3.6 UNIT-MG/ML SOPN, Inject 52 Units into the skin daily.  Current Outpatient Medications (Cardiovascular):    amiodarone (PACERONE) 200 MG tablet, TAKE ONE TABLET BY MOUTH EVERY MORNING   amLODipine (NORVASC) 5 MG tablet, TAKE ONE TABLET BY MOUTH EVERY EVENING   atorvastatin (LIPITOR) 40 MG tablet, TAKE ONE TABLET BY MOUTH  EVERY EVENING   carvedilol (COREG) 25 MG tablet, TAKE ONE TABLET BY MOUTH EVERY MORNING and TAKE ONE TABLET BY MOUTH EVERY EVENING   ezetimibe (ZETIA) 10 MG tablet, Take 1 tablet (10 mg total) by mouth daily.   sacubitril-valsartan (ENTRESTO) 97-103 MG, TAKE ONE TABLET BY MOUTH EVERY MORNING and TAKE ONE TABLET BY MOUTH EVERY EVENING   spironolactone (ALDACTONE) 50 MG tablet, TAKE ONE TABLET BY MOUTH EVERY MORNING   torsemide (DEMADEX) 20 MG tablet, TAKE TWO TABLETS BY MOUTH EVERY MORNING  Current Outpatient Medications (Respiratory):    Ipratropium-Albuterol (COMBIVENT RESPIMAT) 20-100  MCG/ACT AERS respimat, Inhale 1 puff into the lungs every 6 (six) hours.   Current Outpatient Medications (Hematological):    rivaroxaban (XARELTO) 15 MG TABS tablet, Take 1 tablet (15 mg total) by mouth daily with breakfast.  Current Outpatient Medications (Other):    ACCU-CHEK GUIDE test strip, USE TO check blood glucose THREE TIMES DAILY AS DIRECTED   Accu-Chek Softclix Lancets lancets, Check blood sugar three times a day as instructed   amoxicillin (AMOXIL) 500 MG capsule, Take 1 capsule (500 mg total) by mouth every 6 (six) hours until gone.   Blood Glucose Monitoring Suppl (ACCU-CHEK GUIDE) w/Device KIT, 1 Units by Does not apply route 3 (three) times daily.   Blood Glucose Monitoring Suppl (BLOOD GLUCOSE MONITOR SYSTEM) w/Device KIT, Use up to 4 (four) times daily as directed   COMFORT EZ PEN NEEDLES 31G X 6 MM MISC, USE AS DIRECTED AT BEDTIME   COMFORT EZ PEN NEEDLES 32G X 6 MM MISC,    DULoxetine (CYMBALTA) 60 MG capsule, Take 1 capsule (60 mg total) by mouth daily.   esomeprazole (NEXIUM) 40 MG capsule, Take 1 capsule (40 mg total) by mouth 2 (two) times daily before a meal.   sucralfate (CARAFATE) 1 GM/10ML suspension, Take 10 mLs (1 g total) by mouth 4 (four) times daily. (Patient not taking: Reported on 09/30/2021)  Review of Systems:  Review of system negative unless stated in the problem list or HPI.    Physical Exam:  Vitals:   01/27/22 0942  BP: 130/79  Pulse: 92  Temp: 97.6 F (36.4 C)  TempSrc: Oral  SpO2: 98%  Weight: 281 lb 12.8 oz (127.8 kg)  Height: '6\' 1"'  (1.854 m)    Physical Exam General: NAD HENT: NCAT Lungs: CTAB, no wheeze, rhonchi or rales.  Cardiovascular: Normal heart sounds, no r/m/g, 2+ pulses in all extremities. No LE edema Abdomen: No TTP, normal bowel sounds MSK: No asymmetry or muscle atrophy.  Skin: no lesions noted on exposed skin Neuro: Alert and oriented x4. CN grossly intact Psych: Normal mood and normal affect   Assessment &  Plan:   No problem-specific Assessment & Plan notes found for this encounter.   See Encounters Tab for problem based charting.  Patient discussed with Dr. {NAMES:[id number]::"Guilloud","Hoffman","Mullen","Narendra","Vincent","Machen","Lau","Hatcher"} Idamae Schuller, MD [facility]. [facility] Internal Medicine Residency, PGY-2   DMII On Xultophy 100-3.6 mg qd inject 52 units daily. Last filled 05/25/2021 Jardiace 25 mg qd A1c 8.4 3 months ago. >14.0  Depression PHQ 9 is 12. Doesn't want medicine but wants therapy. [facility].

## 2022-01-27 ENCOUNTER — Encounter: Payer: Self-pay | Admitting: Internal Medicine

## 2022-01-27 ENCOUNTER — Ambulatory Visit (INDEPENDENT_AMBULATORY_CARE_PROVIDER_SITE_OTHER): Payer: Medicaid Other | Admitting: Internal Medicine

## 2022-01-27 ENCOUNTER — Other Ambulatory Visit (HOSPITAL_COMMUNITY): Payer: Self-pay

## 2022-01-27 ENCOUNTER — Other Ambulatory Visit: Payer: Self-pay

## 2022-01-27 VITALS — BP 130/79 | HR 92 | Temp 97.6°F | Ht 73.0 in | Wt 281.8 lb

## 2022-01-27 DIAGNOSIS — E1142 Type 2 diabetes mellitus with diabetic polyneuropathy: Secondary | ICD-10-CM | POA: Diagnosis present

## 2022-01-27 DIAGNOSIS — Z7984 Long term (current) use of oral hypoglycemic drugs: Secondary | ICD-10-CM

## 2022-01-27 DIAGNOSIS — F331 Major depressive disorder, recurrent, moderate: Secondary | ICD-10-CM

## 2022-01-27 LAB — POCT GLYCOSYLATED HEMOGLOBIN (HGB A1C): HbA1c POC (<> result, manual entry): >14 % — AB (ref 4.0–5.6)

## 2022-01-27 LAB — GLUCOSE, CAPILLARY: Glucose-Capillary: 488 mg/dL — ABNORMAL HIGH (ref 70–99)

## 2022-01-27 MED ORDER — SEMAGLUTIDE(0.25 OR 0.5MG/DOS) 2 MG/3ML ~~LOC~~ SOPN
0.2500 mg | PEN_INJECTOR | SUBCUTANEOUS | 0 refills | Status: AC
  Filled 2022-01-27: qty 3, 56d supply, fill #0

## 2022-01-27 NOTE — Patient Instructions (Addendum)
Mr.Bronsen Buckles, it was a pleasure seeing you today! You endorsed feeling well today. Below are some of the things we talked about this visit. We look forward to seeing you in the follow up appointment!  Today we discussed: We will give you samples of diabetes medications and work on getting your previous one refilled.  For the insuin levemir, use 45 units daily. And for ozempic use, inject 0.25 mg weekly.   I have ordered the following labs today:   Lab Orders         Glucose, capillary         POC Hbg A1C       Referrals ordered today:   Referral Orders  No referral(s) requested today     I have ordered the following medication/changed the following medications:   Stop the following medications: There are no discontinued medications.   Start the following medications: No orders of the defined types were placed in this encounter.    Follow-up: 2 week follow up already scheduled with PCP. 9/26 at 10:15 am Please make sure to arrive 15 minutes prior to your next appointment. If you arrive late, you may be asked to reschedule.   We look forward to seeing you next time. Please call our clinic at [phone] if you have any questions or concerns. The best time to call is Monday-Friday from 9am-4pm, but there is someone available 24/7. If after hours or the weekend, call the main hospital number and ask for the Internal Medicine Resident On-Call. If you need medication refills, please notify your pharmacy one week in advance and they will send [place] a request.  Thank you for letting us take part in your care. Wishing you the best!  Thank you, Idamae Schuller, MD

## 2022-01-29 MED ORDER — INSULIN DETEMIR 100 UNIT/ML FLEXPEN: 45.0000 [IU] | Freq: Every day | SUBCUTANEOUS | 0 refills | Status: DC

## 2022-01-29 NOTE — Assessment & Plan Note (Signed)
Patient has depression and his PHQ 9 is 12. Doesn't want medicine but does wants therapy. Patient given information regarding [facility].

## 2022-01-29 NOTE — Assessment & Plan Note (Addendum)
Pt has DMII that is uncontrolled. He is supposed be on Xultophy (100-3.6 mg) 52 units daily. Last filled 05/25/2021. He states he has been unable to get this medication filled due to insurance. He also reports missing multiple doses of his Jardiance 25 mg qd. A1c 8.4 3 months ago. >14.0. Patient's insurance refusing Xultophy and I think to avoid this problem in the future, pt can be switched to insulin and ozempic. Insulin pen samples provided and Ozempic sent to the pharmacy. The ozempic can be uptitrated at subsequent visits to max dosage.  -Stop Xultophy, and start Insulin and Start Ozempic at 0.25 mg weekly. Called pt to remind him to pick up ozempic on 09/14 and 09/15.  -Follow up with PCP 02/09/2022

## 2022-01-29 NOTE — Progress Notes (Signed)
Internal Medicine Clinic Attending  Case discussed with Dr. Khan  At the time of the visit.  We reviewed the resident's history and exam and pertinent patient test results.  I agree with the assessment, diagnosis, and plan of care documented in the resident's note.  

## 2022-02-01 ENCOUNTER — Other Ambulatory Visit (HOSPITAL_COMMUNITY): Payer: Self-pay

## 2022-02-09 ENCOUNTER — Encounter: Payer: Self-pay | Admitting: Student

## 2022-02-09 ENCOUNTER — Ambulatory Visit (INDEPENDENT_AMBULATORY_CARE_PROVIDER_SITE_OTHER): Payer: Medicaid Other | Admitting: Student

## 2022-02-09 VITALS — BP 135/81 | HR 88 | Temp 98.0°F | Ht 73.0 in | Wt 287.6 lb

## 2022-02-09 DIAGNOSIS — I152 Hypertension secondary to endocrine disorders: Secondary | ICD-10-CM

## 2022-02-09 DIAGNOSIS — Z7985 Long-term (current) use of injectable non-insulin antidiabetic drugs: Secondary | ICD-10-CM

## 2022-02-09 DIAGNOSIS — E1122 Type 2 diabetes mellitus with diabetic chronic kidney disease: Secondary | ICD-10-CM | POA: Diagnosis not present

## 2022-02-09 DIAGNOSIS — E1142 Type 2 diabetes mellitus with diabetic polyneuropathy: Secondary | ICD-10-CM | POA: Diagnosis not present

## 2022-02-09 DIAGNOSIS — E1159 Type 2 diabetes mellitus with other circulatory complications: Secondary | ICD-10-CM

## 2022-02-09 DIAGNOSIS — Z794 Long term (current) use of insulin: Secondary | ICD-10-CM

## 2022-02-09 DIAGNOSIS — Z0000 Encounter for general adult medical examination without abnormal findings: Secondary | ICD-10-CM

## 2022-02-09 DIAGNOSIS — N183 Chronic kidney disease, stage 3 unspecified: Secondary | ICD-10-CM

## 2022-02-09 NOTE — Patient Instructions (Addendum)
It was a pleasure seeing you in clinic. Today we discussed:   Diabetes: Continue your current medications Increase Ozempic to 0.5 mg weekly in 2 weeks Make a follow up appointment with your eye doctor  We will check blood work and urine labs today I will call with the results  Follow up in 1 month and bring your glucose and pill pack to your next visit   If you have any questions or concerns, please call our clinic at [phone] between 9am-5pm and after hours call [phone] and ask for the internal medicine resident on call. If you feel you are having a medical emergency please call 911.   Thank you, we look forward to helping you remain healthy!

## 2022-02-10 LAB — BMP8+ANION GAP
Anion Gap: 21 mmol/L — ABNORMAL HIGH (ref 10.0–18.0)
BUN/Creatinine Ratio: 9 — ABNORMAL LOW (ref 10–24)
BUN: 14 mg/dL (ref 8–27)
CO2: 20 mmol/L (ref 20–29)
Calcium: 9 mg/dL (ref 8.6–10.2)
Chloride: 97 mmol/L (ref 96–106)
Creatinine, Ser: 1.52 mg/dL — ABNORMAL HIGH (ref 0.76–1.27)
Glucose: 334 mg/dL — ABNORMAL HIGH (ref 70–99)
Potassium: 3.6 mmol/L (ref 3.5–5.2)
Sodium: 138 mmol/L (ref 134–144)
eGFR: 51 mL/min/{1.73_m2} — ABNORMAL LOW (ref >59)

## 2022-02-10 LAB — MICROALBUMIN / CREATININE URINE RATIO
Creatinine, Urine: 60.6 mg/dL
Microalb/Creat Ratio: 96 mg/g creat — ABNORMAL HIGH (ref 0–29)
Microalbumin, Urine: 58.3 ug/mL

## 2022-02-12 MED ORDER — INSULIN DETEMIR 100 UNIT/ML FLEXPEN: 52.0000 [IU] | Freq: Every day | SUBCUTANEOUS | 0 refills | Status: DC

## 2022-02-12 NOTE — Progress Notes (Signed)
Established Patient Office Visit  Subjective   Patient ID: Terry Parks, male    DOB: [birth year]  Age: 66 y.o. MRN: [id number]  Chief Complaint  Patient presents with   Diabetes    follow    Tyrome Donatelli is a 66 year old man who presents today for diabetes follow-up. Please refer to problem based charting for further details and assessment and plan of current problem and chronic medical conditions.    Patient Active Problem List   Diagnosis Date Noted   Recurrent abdominal hernia without obstruction or gangrene 05/22/2021   Pyrosis 05/22/2021   Indigestion 94/50/3888   Periumbilical pain 28/00/3491   Abdominal pain, epigastric 05/22/2021   Class 3 obesity ([place]) 11/10/2020   S/P TURP 11/10/2020   History of iron deficiency anemia 12/16/2019   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   Depression 01/31/2019   Iron deficiency anemia 04/28/2018   CKD stage 3 due to type 2 diabetes mellitus ([place]) 02/11/2018   Healthcare maintenance 02/11/2018   Persistent atrial fibrillation    Vitamin D deficiency 06/22/2016   Allergic rhinitis 10/19/2012   Diabetic retinopathy ([place]) 10/04/2012   Type 2 diabetes mellitus with peripheral neuropathy ([place]) 04/08/2008   Obstructive sleep apnea    Morbid obesity ([place])    Hyperlipidemia associated with type 2 diabetes mellitus ([place])    Hypertension associated with diabetes ([place])    Chronic combined systolic and diastolic CHF (congestive heart failure) ([place]) 03/01/2006      ROS: negative as per HPI    Objective:     BP 135/81 (BP Location: Left Arm, Patient Position: Sitting, Cuff Size: Normal)   Pulse 88   Temp 98 F (36.7 C) (Oral)   Ht _0  (1.854 m)   Wt 287 lb 9.6 oz (130.5 kg)   SpO2 100%   BMI 37.94 kg/m  BP Readings from Last 3 Encounters:  02/09/22 135/81  01/27/22 130/79  10/16/21 (!) 165/104    Physical Exam Constitutional:      General: He is not in acute distress.    Appearance: Normal appearance. He is  obese.  HENT:     Head: Normocephalic and atraumatic.     Mouth/Throat:     Mouth: Mucous membranes are moist.     Pharynx: Oropharynx is clear.  Eyes:     Extraocular Movements: Extraocular movements intact.     Pupils: Pupils are equal, round, and reactive to light.  Cardiovascular:     Rate and Rhythm: Normal rate and regular rhythm.  Pulmonary:     Effort: Pulmonary effort is normal.     Breath sounds: Normal breath sounds.  Abdominal:     General: Abdomen is flat. Bowel sounds are normal.     Palpations: Abdomen is soft.     Tenderness: There is no abdominal tenderness.  Musculoskeletal:     Right lower leg: No edema.     Left lower leg: No edema.  Skin:    General: Skin is warm and dry.     Comments: Unable to evaluate feet  Neurological:     General: No focal deficit present.     Mental Status: He is alert and oriented to person, place, and time.  Psychiatric:        Mood and Affect: Mood normal.        Behavior: Behavior normal.      Results for orders placed or performed in visit on 02/09/22  Microalbumin / Creatinine Urine Ratio  Result Value Ref Range  Creatinine, Urine 60.6 Not Estab. mg/dL   Microalbumin, Urine 58.3 Not Estab. ug/mL   Microalb/Creat Ratio 96 (H) 0 - 29 mg/g creat  BMP8+Anion Gap  Result Value Ref Range   Glucose 334 (H) 70 - 99 mg/dL   BUN 14 8 - 27 mg/dL   Creatinine, Ser 1.52 (H) 0.76 - 1.27 mg/dL   eGFR 51 (L) >59 mL/min/1.73   BUN/Creatinine Ratio 9 (L) 10 - 24   Sodium 138 134 - 144 mmol/L   Potassium 3.6 3.5 - 5.2 mmol/L   Chloride 97 96 - 106 mmol/L   CO2 20 20 - 29 mmol/L   Anion Gap 21.0 (H) 10.0 - 18.0 mmol/L   Calcium 9.0 8.6 - 10.2 mg/dL    Last metabolic panel Lab Results  Component Value Date   GLUCOSE 334 (H) 02/09/2022   NA 138 02/09/2022   K 3.6 02/09/2022   CL 97 02/09/2022   CO2 20 02/09/2022   BUN 14 02/09/2022   CREATININE 1.52 (H) 02/09/2022   EGFR 51 (L) 02/09/2022   CALCIUM 9.0 02/09/2022   PHOS  3.6 11/18/2020   PROT 7.5 09/30/2021   ALBUMIN 4.1 09/30/2021   LABGLOB 3.4 09/30/2021   AGRATIO 1.2 09/30/2021   BILITOT 0.5 09/30/2021   ALKPHOS 87 09/30/2021   AST 16 09/30/2021   ALT 14 09/30/2021   ANIONGAP 11 03/26/2020      The ASCVD Risk score (Arnett DK, et al., 2019) failed to calculate for the following reasons:   The valid total cholesterol range is 130 to 320 mg/dL    Assessment & Plan:   Problem List Items Addressed This Visit       Cardiovascular and Mediastinum   Hypertension associated with diabetes ([place]) (Chronic)    Normotensive today continue on current medications.       Relevant Medications   insulin detemir (LEVEMIR) 100 unit/ml SOLN     Endocrine   Type 2 diabetes mellitus with peripheral neuropathy ([place]) - Primary (Chronic)    Patient did not bring meter to office. Has restarted ozempic and levemir. Is using levemir 52 daily. Reports morning glucoses have been in the 200s.  States he has not been eating well needs to make dietary changes.  He is tolerating Ozempic without any symptoms.  Encouraged him to make these changes.  He declined foot exam.  He needs to make appointment with Dr. Katy Fitch for eye exam.   Continue Levemir 52 units daily Increase Ozempic 2.5 mg weekly in 2 weeks Patient instructed to bring glucose meter to next visit Urine microalbumin creatinine  Follow-up in 4 weeks      Relevant Medications   insulin detemir (LEVEMIR) 100 unit/ml SOLN   Other Relevant Orders   Microalbumin / Creatinine Urine Ratio (Completed)   CKD stage 3 due to type 2 diabetes mellitus (HCC)    BMP today.      Relevant Medications   insulin detemir (LEVEMIR) 100 unit/ml SOLN   Other Relevant Orders   BMP8+Anion Gap (Completed)   BMP8+Anion Gap     Other   Healthcare maintenance (Chronic)    declined influenza vaccine       Return in about 4 weeks (around 03/09/2022) for diabetes.    [name], MD

## 2022-02-12 NOTE — Assessment & Plan Note (Signed)
Normotensive today continue on current medications.

## 2022-02-12 NOTE — Assessment & Plan Note (Signed)
declined influenza vaccine

## 2022-02-12 NOTE — Assessment & Plan Note (Signed)
BMP today

## 2022-02-12 NOTE — Assessment & Plan Note (Addendum)
Patient did not bring meter to office. Has restarted ozempic and levemir. Is using levemir 52 daily. Reports morning glucoses have been in the 200s.  States he has not been eating well needs to make dietary changes.  He is tolerating Ozempic without any symptoms.  Encouraged him to make these changes.  He declined foot exam.  He needs to make appointment with Dr. Katy Fitch for eye exam.   Continue Levemir 52 units daily Increase Ozempic 2 mg weekly in 2 weeks Patient instructed to bring glucose meter to next visit Urine microalbumin creatinine  Follow-up in 4 weeks

## 2022-02-16 ENCOUNTER — Other Ambulatory Visit: Payer: Self-pay | Admitting: Obstetrics and Gynecology

## 2022-02-16 NOTE — Progress Notes (Signed)
Internal Medicine Clinic Attending  Case discussed with Dr. Lisabeth Devoid  At the time of the visit.  We reviewed the resident's history and exam and pertinent patient test results.  I agree with the assessment, diagnosis, and plan of care documented in the resident's note. Increase semaglutide to 0.5 mg weekly, will continue uptitration as tolerated.

## 2022-02-16 NOTE — Patient Instructions (Signed)
Hi Terry Parks you are well and sorry to have missed you today- as a part of your Medicaid benefit, you are eligible for care management and care coordination services at no cost or copay. I was unable to reach you by phone today but would be happy to help you with your health related needs. Please feel free to call me at [phone].  A member of the Managed Medicaid care management team will reach out to you again over the next 30 business days.   [name] RN, BSN [place]  [name] - Managed Medicaid High Risk [phone].

## 2022-02-16 NOTE — Patient Outreach (Signed)
Care Coordination  02/16/2022  Jayton Popelka Feb 09, 1956 [id number]   Medicaid Managed Care   Unsuccessful Outreach Note  02/16/2022 Name: Terry Parks MRN: [id number] DOB: 02-13-1956  Referred by: [name], MD Reason for referral : High Risk Managed Medicaid (Unsuccessful telephone outreach)   An unsuccessful telephone outreach was attempted today. The patient was referred to the case management team for assistance with care management and care coordination.   Follow Up Plan: The care management team will reach out to the patient again over the next 30 business  days.   [name] RN, BSN [place]  [name] - Managed Medicaid High Risk [phone]

## 2022-02-19 DIAGNOSIS — I10 Essential (primary) hypertension: Secondary | ICD-10-CM | POA: Diagnosis not present

## 2022-02-19 DIAGNOSIS — E119 Type 2 diabetes mellitus without complications: Secondary | ICD-10-CM | POA: Diagnosis not present

## 2022-02-19 DIAGNOSIS — R32 Unspecified urinary incontinence: Secondary | ICD-10-CM | POA: Diagnosis not present

## 2022-02-22 ENCOUNTER — Other Ambulatory Visit: Payer: Medicaid Other

## 2022-02-22 DIAGNOSIS — N183 Chronic kidney disease, stage 3 unspecified: Secondary | ICD-10-CM

## 2022-02-22 DIAGNOSIS — E1122 Type 2 diabetes mellitus with diabetic chronic kidney disease: Secondary | ICD-10-CM | POA: Diagnosis not present

## 2022-02-23 LAB — BMP8+ANION GAP
Anion Gap: 14 mmol/L (ref 10.0–18.0)
BUN/Creatinine Ratio: 9 — ABNORMAL LOW (ref 10–24)
BUN: 13 mg/dL (ref 8–27)
CO2: 28 mmol/L (ref 20–29)
Calcium: 9.2 mg/dL (ref 8.6–10.2)
Chloride: 98 mmol/L (ref 96–106)
Creatinine, Ser: 1.52 mg/dL — ABNORMAL HIGH (ref 0.76–1.27)
Glucose: 215 mg/dL — ABNORMAL HIGH (ref 70–99)
Potassium: 3.7 mmol/L (ref 3.5–5.2)
Sodium: 140 mmol/L (ref 134–144)
eGFR: 51 mL/min/{1.73_m2} — ABNORMAL LOW (ref >59)

## 2022-02-24 ENCOUNTER — Encounter: Payer: Self-pay | Admitting: Internal Medicine

## 2022-02-24 NOTE — Progress Notes (Signed)
Stable kidney function/GFR, improved AG. Unable to reach Mr. San x2 by telephone. Letter to be sent.

## 2022-03-10 ENCOUNTER — Encounter: Payer: Medicaid Other | Admitting: Student

## 2022-03-11 ENCOUNTER — Encounter: Payer: Self-pay | Admitting: Student

## 2022-03-17 LAB — HM DIABETES EYE EXAM

## 2022-04-01 ENCOUNTER — Encounter: Payer: Self-pay | Admitting: Dietician

## 2022-04-05 ENCOUNTER — Encounter: Payer: Self-pay | Admitting: Student

## 2022-04-05 ENCOUNTER — Encounter: Payer: Medicaid Other | Admitting: Student

## 2022-04-08 DIAGNOSIS — E119 Type 2 diabetes mellitus without complications: Secondary | ICD-10-CM | POA: Diagnosis not present

## 2022-04-08 DIAGNOSIS — I10 Essential (primary) hypertension: Secondary | ICD-10-CM | POA: Diagnosis not present

## 2022-04-08 DIAGNOSIS — R32 Unspecified urinary incontinence: Secondary | ICD-10-CM | POA: Diagnosis not present

## 2022-04-15 DIAGNOSIS — C61 Malignant neoplasm of prostate: Secondary | ICD-10-CM | POA: Diagnosis not present

## 2022-04-16 ENCOUNTER — Other Ambulatory Visit: Payer: Medicaid Other | Admitting: Obstetrics and Gynecology

## 2022-04-16 NOTE — Patient Outreach (Signed)
Care Coordination  04/16/2022  Terry Parks [birth year] [id number]  RNCM returned patient's phone call.  Patient states he has been out of  insulin for over a month-was not packaged in delivery from [facility].  Patient given phone number for [facility] and states he will call right now to obtain insulin from [facility].  Patient instructed to call back  if unable to obtain needed insulin.  [name] RN, BSN [place]  [name] - Managed Medicaid High Risk [phone]

## 2022-04-19 ENCOUNTER — Other Ambulatory Visit: Payer: Self-pay | Admitting: Cardiovascular Disease

## 2022-04-19 DIAGNOSIS — I5042 Chronic combined systolic (congestive) and diastolic (congestive) heart failure: Secondary | ICD-10-CM

## 2022-04-20 ENCOUNTER — Other Ambulatory Visit: Payer: Self-pay | Admitting: Student

## 2022-04-20 DIAGNOSIS — E1142 Type 2 diabetes mellitus with diabetic polyneuropathy: Secondary | ICD-10-CM

## 2022-04-20 MED ORDER — INSULIN DETEMIR 100 UNIT/ML FLEXPEN: 52.0000 [IU] | Freq: Every day | SUBCUTANEOUS | 11 refills | Status: DC

## 2022-04-20 MED ORDER — OZEMPIC (2 MG/DOSE) 8 MG/3ML ~~LOC~~ SOPN: 2.0000 mg | PEN_INJECTOR | SUBCUTANEOUS | 3 refills | Status: DC

## 2022-04-20 MED ORDER — LEVEMIR FLEXPEN 100 UNIT/ML ~~LOC~~ SOPN: 52.0000 [IU] | PEN_INJECTOR | Freq: Every day | SUBCUTANEOUS | 11 refills | Status: DC

## 2022-04-20 NOTE — Telephone Encounter (Signed)
Xarelto #[id number] with 3 refills sent on 10/06/21. Patient notified to contact [facility] for this refill.

## 2022-04-20 NOTE — Addendum Note (Signed)
Addended by: Velora Heckler on: 04/20/2022 03:46 PM   Modules accepted: Orders

## 2022-04-20 NOTE — Addendum Note (Signed)
Addended by: Velora Heckler on: 04/20/2022 04:20 PM   Modules accepted: Orders

## 2022-04-20 NOTE — Addendum Note (Signed)
Addended by: [name] on: 04/20/2022 04:00 PM   Modules accepted: Orders

## 2022-04-20 NOTE — Telephone Encounter (Signed)
rivaroxaban (XARELTO) 15 MG TABS tablet  Ozempic (semaglutide) insulin detemir (LEVEMIR) 100 unit/ml SOLN   [facility] - [place], [place] - [place]. [address]

## 2022-04-20 NOTE — Addendum Note (Signed)
Addended by: [name] on: 04/20/2022 04:34 PM   Modules accepted: Orders

## 2022-04-27 ENCOUNTER — Other Ambulatory Visit: Payer: Self-pay

## 2022-04-27 ENCOUNTER — Ambulatory Visit (INDEPENDENT_AMBULATORY_CARE_PROVIDER_SITE_OTHER): Payer: Medicaid Other | Admitting: Student

## 2022-04-27 ENCOUNTER — Encounter: Payer: Self-pay | Admitting: Student

## 2022-04-27 VITALS — BP 132/75 | HR 79 | Temp 97.7°F | Resp 24 | Ht 73.0 in | Wt 307.3 lb

## 2022-04-27 DIAGNOSIS — I5042 Chronic combined systolic (congestive) and diastolic (congestive) heart failure: Secondary | ICD-10-CM | POA: Diagnosis not present

## 2022-04-27 DIAGNOSIS — Z7984 Long term (current) use of oral hypoglycemic drugs: Secondary | ICD-10-CM

## 2022-04-27 DIAGNOSIS — E1142 Type 2 diabetes mellitus with diabetic polyneuropathy: Secondary | ICD-10-CM

## 2022-04-27 DIAGNOSIS — Z7985 Long-term (current) use of injectable non-insulin antidiabetic drugs: Secondary | ICD-10-CM | POA: Diagnosis not present

## 2022-04-27 DIAGNOSIS — I152 Hypertension secondary to endocrine disorders: Secondary | ICD-10-CM | POA: Diagnosis not present

## 2022-04-27 DIAGNOSIS — E1159 Type 2 diabetes mellitus with other circulatory complications: Secondary | ICD-10-CM

## 2022-04-27 LAB — POCT GLYCOSYLATED HEMOGLOBIN (HGB A1C): Hemoglobin A1C: 12.7 % — AB (ref 4.0–5.6)

## 2022-04-27 LAB — GLUCOSE, CAPILLARY: Glucose-Capillary: 303 mg/dL — ABNORMAL HIGH (ref 70–99)

## 2022-04-27 MED ORDER — LANTUS SOLOSTAR 100 UNIT/ML ~~LOC~~ SOPN: 52.0000 [IU] | PEN_INJECTOR | Freq: Every day | SUBCUTANEOUS | 3 refills | Status: DC

## 2022-04-27 MED ORDER — TORSEMIDE 20 MG PO TABS: 40.0000 mg | ORAL_TABLET | Freq: Every day | ORAL | 0 refills | Status: DC

## 2022-04-27 NOTE — Patient Instructions (Signed)
We will check blood work today.  Please take twice your normal torsemide for the next 3 days and check your weight at home to see if that helps with your weight.  We will change your levemir to lantus continue using your levemir until you get you new insulin and continue using 52 units daily  Bring you meter and pill pack an follow up in 2 weeks

## 2022-04-28 DIAGNOSIS — N393 Stress incontinence (female) (male): Secondary | ICD-10-CM | POA: Diagnosis not present

## 2022-04-28 DIAGNOSIS — N3941 Urge incontinence: Secondary | ICD-10-CM | POA: Diagnosis not present

## 2022-04-28 DIAGNOSIS — C61 Malignant neoplasm of prostate: Secondary | ICD-10-CM | POA: Diagnosis not present

## 2022-04-28 DIAGNOSIS — N5231 Erectile dysfunction following radical prostatectomy: Secondary | ICD-10-CM | POA: Diagnosis not present

## 2022-04-28 LAB — BMP8+ANION GAP
Anion Gap: 15 mmol/L (ref 10.0–18.0)
BUN/Creatinine Ratio: 7 — ABNORMAL LOW (ref 10–24)
BUN: 9 mg/dL (ref 8–27)
CO2: 20 mmol/L (ref 20–29)
Calcium: 8.4 mg/dL — ABNORMAL LOW (ref 8.6–10.2)
Chloride: 105 mmol/L (ref 96–106)
Creatinine, Ser: 1.24 mg/dL (ref 0.76–1.27)
Glucose: 285 mg/dL — ABNORMAL HIGH (ref 70–99)
Potassium: 4.3 mmol/L (ref 3.5–5.2)
Sodium: 140 mmol/L (ref 134–144)
eGFR: 65 mL/min/{1.73_m2} (ref >59)

## 2022-05-03 NOTE — Assessment & Plan Note (Signed)
A1c 12.7 percent. Diabetes remains poorly controlled. States hs I on Ozempic 2 mg weekly, jardiance 25 mg daily, and Levemir 52 units daily. Cannot tolerate metformin. States he does not always take his medication regularly due to forgetting. Discussed if has other barriers which he denies. He gets medication through mail order pharmacy. Does not appear he calls in his refills consistently. Discussed that given his poor control next step would be to start mealtime insulin. He is adamant he does not need this and get will make dietary changes and work on taking medications more regularly.   Will switch him from levemir to Lantus 52 units, as levemir will no longer be available next month. Suspect he need more basal insulin given elevated A1c so will start him on same amoung of glargine as his current levemir Continue jardiance and ozempic Asked him to bring pill pack and meter to follow up Follow up in 2 weeks

## 2022-05-03 NOTE — Assessment & Plan Note (Addendum)
Increased weight and LE edema on exam. No dyspnea. NO JVD or crackles. Weight is 307lbs up about 20 lbs from last visit. Will have him double his torsemide to 80 mg for next 3 days. Patient understand and will call if swelling not improving, develops respiratory symptoms or weight not improving. Check BMP today.

## 2022-05-03 NOTE — Progress Notes (Signed)
Established Patient Office Visit  Subjective   Patient ID: Terry Parks, male    DOB: 1955-06-25  Age: 66 y.o. MRN: [id number]  Chief Complaint  Patient presents with   Follow-up   Medication Refill    Terry Parks is a 66 y.o. person living with a history listed below who presents to clinic for diabetes follow up. Please refer to problem based charting for further details and assessment and plan of current problem and chronic medical conditions.     Patient Active Problem List   Diagnosis Date Noted   Recurrent abdominal hernia without obstruction or gangrene 05/22/2021   Pyrosis 05/22/2021   Indigestion 83/25/4982   Periumbilical pain 64/15/8309   Abdominal pain, epigastric 05/22/2021   Class 3 obesity ([place]) 11/10/2020   S/P TURP 11/10/2020   History of iron deficiency anemia 12/16/2019   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   Depression 01/31/2019   Iron deficiency anemia 04/28/2018   CKD stage 3 due to type 2 diabetes mellitus ([place]) 02/11/2018   Healthcare maintenance 02/11/2018   Persistent atrial fibrillation    Vitamin D deficiency 06/22/2016   Allergic rhinitis 10/19/2012   Diabetic retinopathy ([place]) 10/04/2012   Type 2 diabetes mellitus with peripheral neuropathy ([place]) 04/08/2008   Obstructive sleep apnea    Morbid obesity ([place])    Hyperlipidemia associated with type 2 diabetes mellitus ([place])    Hypertension associated with diabetes ([place])    Chronic combined systolic and diastolic CHF (congestive heart failure) ([place]) 03/01/2006     ROS: negative as per HPI     Objective:     BP 132/75 (BP Location: Right Arm, Cuff Size: Large)   Pulse 79   Temp 97.7 F (36.5 C) (Oral)   Resp (!) 24   Ht _0  (1.854 m)   Wt (!) 307 lb 4.8 oz (139.4 kg)   SpO2 99%   BMI 40.54 kg/m  BP Readings from Last 3 Encounters:  04/27/22 132/75  02/09/22 135/81  01/27/22 130/79      Physical Exam Constitutional:      Appearance: Normal appearance.  HENT:      Head: Normocephalic and atraumatic.     Mouth/Throat:     Mouth: Mucous membranes are moist.     Pharynx: Oropharynx is clear.  Cardiovascular:     Rate and Rhythm: Normal rate and regular rhythm.     Pulses: Normal pulses.     Comments: No JVD, exam limited by habitus  Pulmonary:     Effort: Pulmonary effort is normal.     Breath sounds: No rhonchi or rales.  Abdominal:     General: Abdomen is flat. Bowel sounds are normal. There is no distension.     Palpations: Abdomen is soft.     Tenderness: There is no abdominal tenderness.  Musculoskeletal:        General: Normal range of motion.     Right lower leg: Edema (2+) present.     Left lower leg: Edema (2+) present.  Skin:    General: Skin is warm and dry.     Capillary Refill: Capillary refill takes less than 2 seconds.  Neurological:     General: No focal deficit present.     Mental Status: He is alert and oriented to person, place, and time.  Psychiatric:        Mood and Affect: Mood normal.        Behavior: Behavior normal.      Results for orders  placed or performed in visit on 04/27/22  Glucose, capillary  Result Value Ref Range   Glucose-Capillary 303 (H) 70 - 99 mg/dL  BMP8+Anion Gap  Result Value Ref Range   Glucose 285 (H) 70 - 99 mg/dL   BUN 9 8 - 27 mg/dL   Creatinine, Ser 1.24 0.76 - 1.27 mg/dL   eGFR 65 >59 mL/min/1.73   BUN/Creatinine Ratio 7 (L) 10 - 24   Sodium 140 134 - 144 mmol/L   Potassium 4.3 3.5 - 5.2 mmol/L   Chloride 105 96 - 106 mmol/L   CO2 20 20 - 29 mmol/L   Anion Gap 15.0 10.0 - 18.0 mmol/L   Calcium 8.4 (L) 8.6 - 10.2 mg/dL  POC Hbg A1C  Result Value Ref Range   Hemoglobin A1C 12.7 (A) 4.0 - 5.6 %   HbA1c POC (<> result, manual entry)     HbA1c, POC (prediabetic range)     HbA1c, POC (controlled diabetic range)      Last metabolic panel Lab Results  Component Value Date   GLUCOSE 285 (H) 04/27/2022   NA 140 04/27/2022   K 4.3 04/27/2022   CL 105 04/27/2022   CO2 20  04/27/2022   BUN 9 04/27/2022   CREATININE 1.24 04/27/2022   EGFR 65 04/27/2022   CALCIUM 8.4 (L) 04/27/2022   PHOS 3.6 11/18/2020   PROT 7.5 09/30/2021   ALBUMIN 4.1 09/30/2021   LABGLOB 3.4 09/30/2021   AGRATIO 1.2 09/30/2021   BILITOT 0.5 09/30/2021   ALKPHOS 87 09/30/2021   AST 16 09/30/2021   ALT 14 09/30/2021   ANIONGAP 11 03/26/2020   Last hemoglobin A1c Lab Results  Component Value Date   HGBA1C 12.7 (A) 04/27/2022      The ASCVD Risk score (Arnett DK, et al., 2019) failed to calculate for the following reasons:   The valid total cholesterol range is 130 to 320 mg/dL    Assessment & Plan:   Problem List Items Addressed This Visit       Cardiovascular and Mediastinum   Hypertension associated with diabetes ([place]) (Chronic)   Relevant Medications   insulin glargine (LANTUS SOLOSTAR) 100 UNIT/ML Solostar Pen   Other Relevant Orders   BMP8+Anion Gap (Completed)   Chronic combined systolic and diastolic CHF (congestive heart failure) ([facility]) (Chronic)    Increased weight and LE edema on exam. No dyspnea. NO JVD or crackles. Weight is 307lbs up about 20 lbs from last visit. Will have him double his torsemide to 80 mg for next 3 days. Patient understand and will call if swelling not improving, develops respiratory symptoms or weight not improving. Check BMP today.        Endocrine   Type 2 diabetes mellitus with peripheral neuropathy ([facility]) - Primary (Chronic)    A1c 12.7 percent. Diabetes remains poorly controlled. States hs I on Ozempic 2 mg weekly, jardiance 25 mg daily, and Levemir 52 units daily. Cannot tolerate metformin. States he does not always take his medication regularly due to forgetting. Discussed if has other barriers which he denies. He gets medication through mail order pharmacy. Does not appear he calls in his refills consistently. Discussed that given his poor control next step would be to start mealtime insulin. He is adamant he does not need this and get  will make dietary changes and work on taking medications more regularly.   Will switch him from levemir to Lantus 52 units, as levemir will no longer be available next month. Suspect he  need more basal insulin given elevated A1c so will start him on same amoung of glargine as his current levemir Continue jardiance and ozempic Asked him to bring pill pack and meter to follow up Follow up in 2 weeks      Relevant Medications   insulin glargine (LANTUS SOLOSTAR) 100 UNIT/ML Solostar Pen   Other Relevant Orders   POC Hbg A1C (Completed)   Ambulatory referral to Podiatry    Return in about 2 weeks (around 05/11/2022).    [name], MD

## 2022-05-06 DIAGNOSIS — I10 Essential (primary) hypertension: Secondary | ICD-10-CM | POA: Diagnosis not present

## 2022-05-06 DIAGNOSIS — R32 Unspecified urinary incontinence: Secondary | ICD-10-CM | POA: Diagnosis not present

## 2022-05-06 DIAGNOSIS — E119 Type 2 diabetes mellitus without complications: Secondary | ICD-10-CM | POA: Diagnosis not present

## 2022-05-12 NOTE — Progress Notes (Signed)
Internal Medicine Clinic Attending ? ?Case discussed with Dr. Liang  At the time of the visit.  We reviewed the resident?s history and exam and pertinent patient test results.  I agree with the assessment, diagnosis, and plan of care documented in the resident?s note. ? ?

## 2022-05-13 ENCOUNTER — Ambulatory Visit (INDEPENDENT_AMBULATORY_CARE_PROVIDER_SITE_OTHER): Payer: Medicaid Other | Admitting: Student

## 2022-05-13 ENCOUNTER — Encounter: Payer: Self-pay | Admitting: Student

## 2022-05-13 ENCOUNTER — Other Ambulatory Visit: Payer: Self-pay | Admitting: Cardiovascular Disease

## 2022-05-13 VITALS — BP 124/79 | HR 82 | Temp 97.7°F | Ht 73.0 in | Wt 285.3 lb

## 2022-05-13 DIAGNOSIS — E1142 Type 2 diabetes mellitus with diabetic polyneuropathy: Secondary | ICD-10-CM | POA: Diagnosis present

## 2022-05-13 DIAGNOSIS — I5042 Chronic combined systolic (congestive) and diastolic (congestive) heart failure: Secondary | ICD-10-CM | POA: Diagnosis not present

## 2022-05-13 DIAGNOSIS — E66813 Obesity, class 3: Secondary | ICD-10-CM

## 2022-05-13 DIAGNOSIS — Z6837 Body mass index (BMI) 37.0-37.9, adult: Secondary | ICD-10-CM

## 2022-05-13 DIAGNOSIS — Z794 Long term (current) use of insulin: Secondary | ICD-10-CM | POA: Diagnosis not present

## 2022-05-13 MED ORDER — ACCU-CHEK SOFTCLIX LANCETS MISC: 11 refills | Status: DC

## 2022-05-13 MED ORDER — LANTUS SOLOSTAR 100 UNIT/ML ~~LOC~~ SOPN: 52.0000 [IU] | PEN_INJECTOR | Freq: Every day | SUBCUTANEOUS | 5 refills | Status: DC

## 2022-05-13 MED ORDER — ACCU-CHEK GUIDE VI STRP: ORAL_STRIP | 11 refills | Status: DC

## 2022-05-13 NOTE — Patient Instructions (Signed)
Thank you, Mr.Brentlee Vangorder for allowing [place] to provide your care today. Today we discussed your diabetes.  Great job getting off 22 pounds since the last office visit.  Your blood sugars are still high but started to get better now that you are back on your regular regimen. I encourage you to continue making the dietary changes and taking your medications consistently.  I have sent refills of your Lantus to you pharmacy.  My Chart Access: [email or web address]  Please follow-up with me in 4 weeks.  Please make sure to arrive 15 minutes prior to your next appointment. If you arrive late, you may be asked to reschedule.    We look forward to seeing you next time. Please call our clinic at [phone] if you have any questions or concerns. The best time to call is Monday-Friday from 9am-4pm, but there is someone available 24/7. If after hours or the weekend, call the main hospital number and ask for the Internal Medicine Resident On-Call. If you need medication refills, please notify your pharmacy one week in advance and they will send [place] a request.   Thank you for letting us take part in your care. Wishing you the best!  Lacinda Axon, MD 05/13/2022, 9:33 AM IM Resident, PGY-3 [facility] 41:10

## 2022-05-13 NOTE — Assessment & Plan Note (Signed)
Patient here for 2-week follow-up on his uncontrolled diabetes, A1c 12.7%. Currently on 52 units of basal insulin. Plan was to start mealtime insulin during the last office visit but patient decided to make dietary changes and work on taking medications regularly.  Today, patient states he has been consistent with his insulin and diabetes medications and thinks he can get his A1c down with this plan. His weight is down 22 pounds since last office visit, partially from [place]. States he has been eating better and has decreased his portion sizes. He plans to start working out soon. He would prefer not to make any changes to his insulin until he has had more time to stay on track. He reports having difficulty injecting his insulin via the Solostar pen and not sure if he is delivering all the insulin. Requested [name] to help with his during office visit today and advised him to rotate the sites of injection on his abdomen. His meter reading shows blood sugar still consistently in the 200s but improving. Used shared decision-making to hold off on any further changes and allow patient to work on his diet and exercise with plan to follow-up in 4 weeks for reevaluation and adjustment to insulin dose.  Plan: -Continue Lantus 52 units daily -Continue Ozempic 2 mg weekly -Continue Jardiance 25 mg daily -Continue lifestyle modifications with exercise and dietary changes -Follow-up in 4 weeks

## 2022-05-13 NOTE — Assessment & Plan Note (Signed)
Patient has been adherent to his Ozempic dose. He has been making dietary changes such as eating healthier meals, decreasing portion size and cutting out sodas. He has lost 22 pounds since last office visit 2 weeks ago. Weight loss is partially from the brief increase in his torsemide dose however he has certainly lost some fat as well. Encouraged patient to continue making dietary changes and start exercising as well. Filed Weights   05/13/22 0858  Weight: 285 lb 4.8 oz (129.4 kg)   Plan: -Continue lifestyle modifications with dietary changes and exercise -Continue Ozempic 2 mg weekly

## 2022-05-13 NOTE — Progress Notes (Signed)
CC: Diabetes follow-up  HPI:  [name] is a 66 y.o. male with PMH as below who presents to clinic to follow-up on his uncontrolled diabetes. Please see problem based charting for evaluation, assessment and plan.  Past Medical History:  Diagnosis Date   Abscessed tooth    top back large cavity no pain or drainage, one on bottom  pt pulled tooth 4-5 months ago, right top large hole in tooth   AKI (acute kidney injury) ([place])    Allergic rhinitis    Anemia    Anxiety    Asthma    Atrial fibrillation (HCC)    BPH (benign prostatic hypertrophy)    Massive BPH noted on cystoscopy 1/23/ 2012 by Dr. Risa Grill.   Cancer [facility])    prostate cancer 2019   Cardiomyopathy [facility])    CHF (congestive heart failure) ([place])    Cough 03/30/2012   Depression    Diabetes mellitus 04/08/2008   type 2   Dyspnea    Dysrhythmia 2019   Foley catheter in place 07-05-17 placed   Fracture, orbital ([place]) 2021   Right   GERD (gastroesophageal reflux disease) 2020   Headache(784.0)    hx migraines none recent   History of esophagitis 12/16/2019   Hyperlipemia    Hypertension    Hypertensive cardiopathy 03/01/2006   2-D echocardiogram 02/01/2012 showed moderate LVH, mildly to moderately reduced left ventricular systolic function with an estimated ejection fraction of 40-45%, and diffuse hypokinesis.  A nuclear medicine stress study done 01/31/2012 showed no reversible ischemia, a small mid anterior wall fixed defect/infarct, and ejection fraction 42%.       Neck pain    Nephrolithiasis 05/29/2010   CT scan of abdomen/pelvis on 05/29/2010 showed an obstructing approximate 1-2 mm calculus at the left UVJ, and an approximate 1-2 mm left lower pole renal calculus.   Patient had continuing severe pain , and an elevation of his serum creatinine to a value of 1.75 on 06/06/2010.  Patient underwent cystoscopy on 06/08/2010 by Dr. Risa Grill, but attempts at retrograde pyelogram and ureteroscopy were unsucc   Numbness  01/08/2018   Obstructive sleep apnea 03/06/2008   Sleep study 03/06/08 showed severe OSA/hypopnea syndrome, with successful CPAP titration to 13 CWP using a medium ResMed Mirage Quattro full face mask with heated humidifier.    Rash 04/17/2014   Renal calculus 05/29/2010   CT scan of abdomen/pelvis on 05/29/2010 showed an obstructing approximate 1-2 mm calculus at the left UVJ, and an approximate 1-2 mm left lower pole renal calculus.   Patient had continuing severe pain , and an elevation of his serum creatinine to a value of 1.75 on 06/06/2010.  The stone had apparently passed and was not seen on repeat CT 06/08/2010.   Sleep apnea    haven't use cpap in 2 years   Tooth pain 10/29/2017   Urinary straining 11/02/2016   Review of Systems:  Constitutional: Positive for weight loss and insomnia. Negative for fatigue or fever. Eyes: Negative for visual changes Respiratory: Negative for shortness of breath Cardiac: Negative for chest pain Extremities: Negative for leg swelling  Physical Exam: General: Pleasant, well-appearing man sitting in chair. No acute distress. Cardiac: RRR. No murmurs, rubs or gallops. No LE edema Respiratory: Lungs CTAB. No wheezing or crackles. Abdominal: Soft, symmetric and non tender. Normal BS. Skin: Warm, dry and intact without rashes or lesions Extremities: Palpable radial pulses. Neuro: A&O x 3. Moves all extremities. Normal sensation to gross touch. Psych: Appropriate mood  and affect.  Vitals:   05/13/22 0858  BP: 124/79  Pulse: 82  Temp: 97.7 F (36.5 C)  TempSrc: Oral  SpO2: 100%  Weight: 285 lb 4.8 oz (129.4 kg)  Height: '6\' 1"'$  (1.854 m)    Assessment & Plan:   Type 2 diabetes mellitus with peripheral neuropathy (HCC) Patient here for 2-week follow-up on his uncontrolled diabetes, A1c 12.7%. Currently on 52 units of basal insulin. Plan was to start mealtime insulin during the last office visit but patient decided to make dietary changes and work on  taking medications regularly.  Today, patient states he has been consistent with his insulin and diabetes medications and thinks he can get his A1c down with this plan. His weight is down 22 pounds since last office visit, partially from [place]. States he has been eating better and has decreased his portion sizes. He plans to start working out soon. He would prefer not to make any changes to his insulin until he has had more time to stay on track. He reports having difficulty injecting his insulin via the Solostar pen and not sure if he is delivering all the insulin. Requested [name] to help with his during office visit today and advised him to rotate the sites of injection on his abdomen. His meter reading shows blood sugar still consistently in the 200s but improving. Used shared decision-making to hold off on any further changes and allow patient to work on his diet and exercise with plan to follow-up in 4 weeks for reevaluation and adjustment to insulin dose.  Plan: -Continue Lantus 52 units daily -Continue Ozempic 2 mg weekly -Continue Jardiance 25 mg daily -Continue lifestyle modifications with exercise and dietary changes -Follow-up in 4 weeks  Chronic combined systolic and diastolic CHF (congestive heart failure) ([place]) Patient with increased lower extremity swelling during last office visit. Advised to double his torsemide for 3 days. Per patient, his lower extremity swelling has resolved. He denies any shortness of breath or further leg swelling since resuming regular torsemide dose. Weight is down 22 lbs since last office visit 2 weeks ago. No evidence of heart failure exacerbation on exam.  Plan: -Continue torsemide 20 mg daily -Continue Entresto 97-103 mg BID -Continue Jardiance 25 mg daily -Continue spironolactone 50 mg daily -Continue Coreg 25 mg daily  Class 3 obesity Patient has been adherent to his Ozempic dose. He has been making dietary changes such as eating healthier meals,  decreasing portion size and cutting out sodas. He has lost 22 pounds since last office visit 2 weeks ago. Weight loss is partially from the brief increase in his torsemide dose however he has certainly lost some fat as well. Encouraged patient to continue making dietary changes and start exercising as well. Filed Weights   05/13/22 0858  Weight: 285 lb 4.8 oz (129.4 kg)   Plan: -Continue lifestyle modifications with dietary changes and exercise -Continue Ozempic 2 mg weekly   See Encounters Tab for problem based charting.  Patient discussed with Dr. Lorenz Coaster, MD, MPH

## 2022-05-13 NOTE — Assessment & Plan Note (Signed)
Patient with increased lower extremity swelling during last office visit. Advised to double his torsemide for 3 days. Per patient, his lower extremity swelling has resolved. He denies any shortness of breath or further leg swelling since resuming regular torsemide dose. Weight is down 22 lbs since last office visit 2 weeks ago. No evidence of heart failure exacerbation on exam.  Plan: -Continue torsemide 20 mg daily -Continue Entresto 97-103 mg BID -Continue Jardiance 25 mg daily -Continue spironolactone 50 mg daily -Continue Coreg 25 mg daily

## 2022-05-20 ENCOUNTER — Ambulatory Visit: Payer: Medicaid Other | Admitting: Obstetrics and Gynecology

## 2022-05-20 ENCOUNTER — Other Ambulatory Visit: Payer: Medicaid Other | Admitting: Obstetrics and Gynecology

## 2022-05-20 NOTE — Patient Outreach (Signed)
  Medicaid Managed Care   Unsuccessful Attempt Note   05/20/2022 Name: Terry Parks MRN: [id number] DOB: 03/10/1956  Referred by: [name], MD Reason for referral : High Risk Managed Medicaid (Unsuccessful telephone outreach)   An unsuccessful telephone outreach was attempted today. The patient was referred to the case management team for assistance with care management and care coordination.    Follow Up Plan: The Managed Medicaid care management team will reach out to the patient again over the next 30 business  days. and The  Patient has been provided with contact information for the Managed Medicaid care management team and has been advised to call with any health related questions or concerns.   [name] RN, BSN [place]  [name] - Managed Medicaid High Risk [phone]

## 2022-05-20 NOTE — Progress Notes (Signed)
Internal Medicine Clinic Attending  Case discussed with the resident at the time of the visit.  We reviewed the resident's history and exam and pertinent patient test results.  I agree with the assessment, diagnosis, and plan of care documented in the resident's note.  

## 2022-05-20 NOTE — Patient Instructions (Signed)
Hi Mr. Leder, I am sorry to have missed you today, I hope you are well - as a part of your Medicaid benefit, you are eligible for care management and care coordination services at no cost or copay. I was unable to reach you by phone today but would be happy to help you with your health related needs. Please feel free to call me at [phone].  A member of the Managed Medicaid care management team will reach out to you again over the next 30 business  days.   [name] RN, BSN [place]  [name] - Managed Medicaid High Risk [phone]

## 2022-06-10 DIAGNOSIS — I10 Essential (primary) hypertension: Secondary | ICD-10-CM | POA: Diagnosis not present

## 2022-06-10 DIAGNOSIS — E119 Type 2 diabetes mellitus without complications: Secondary | ICD-10-CM | POA: Diagnosis not present

## 2022-06-10 DIAGNOSIS — R32 Unspecified urinary incontinence: Secondary | ICD-10-CM | POA: Diagnosis not present

## 2022-06-15 ENCOUNTER — Other Ambulatory Visit: Payer: Medicaid Other | Admitting: Obstetrics and Gynecology

## 2022-06-15 ENCOUNTER — Encounter: Payer: Self-pay | Admitting: Obstetrics and Gynecology

## 2022-06-15 NOTE — Patient Outreach (Signed)
Medicaid Managed Care   Nurse Care Manager Note  06/15/2022 Name:  Terry Parks MRN:  [id number] DOB:  02-Dec-1955  Terry Parks is an 67 y.o. year old male who is a primary patient of Terry Beard, MD.  [facility] Managed Care Coordination team was consulted for assistance with:    Chronic healthcare management needs, HTN, DM, HF, OSA, vision loss, PAF, HLD, PAF, chronic pain, CKD  Terry Parks was given information about Medicaid Managed Care Coordination team services today. Terry Parks Patient agreed to services and verbal consent obtained.  Engaged with patient by telephone for follow up visit in response to provider referral for case management and/or care coordination services.   Assessments/Interventions:  Review of past medical history, allergies, medications, health status, including review of consultants reports, laboratory and other test data, was performed as part of comprehensive evaluation and provision of chronic care management services.  SDOH (Social Determinants of Health) assessments and interventions performed: SDOH Interventions    Flowsheet Row Patient Outreach Telephone from 06/15/2022 in [place] Office Visit from 04/27/2022 in [place] Patient Outreach Telephone from 04/16/2022 in [place] Office Visit from 01/27/2022 in [place] Patient Outreach Telephone from 01/14/2022 in [place] Patient Outreach Telephone from 12/08/2021 in [place] Interventions        Food Insecurity Interventions -- Intervention Not Indicated -- -- -- --  Housing Interventions Intervention Not Indicated -- -- -- -- --  Utilities Interventions -- -- Intervention Not Indicated -- -- --  Alcohol Usage Interventions Intervention Not Indicated (Score <7) -- -- -- -- --   Depression Interventions/Treatment  -- -- -- Counseling, Patient refuses Treatment -- --  Financial Strain Interventions -- -- -- -- -- Intervention Not Indicated  Physical Activity Interventions -- -- -- -- Intervention Not Indicated --  Stress Interventions -- -- -- -- -- Intervention Not Indicated  Social Connections Interventions -- Intervention Not Indicated -- -- -- --     Care Plan  Allergies  Allergen Reactions   Lisinopril Cough   Medications Reviewed Today     Reviewed by Terry Medicus, RN (Registered Nurse) on 06/15/22 at [place] List Status: <None>   Medication Order Taking? Sig Documenting Provider Last Dose Status Informant  Accu-Chek Softclix Lancets lancets [id number]  Check blood sugar three times a day as instructed Lacinda Axon, MD  Active   amiodarone (PACERONE) 200 MG tablet [id number]  TAKE ONE TABLET BY MOUTH EVERY MORNING [name], [name], MD  Active   amLODipine (NORVASC) 5 MG tablet [id number] No TAKE ONE TABLET BY MOUTH EVERY EVENING [name], [name], MD Taking Active   amoxicillin (AMOXIL) 500 MG capsule [id number]  Take 1 capsule (500 mg total) by mouth every 6 (six) hours until gone.   Active   atorvastatin (LIPITOR) 40 MG tablet [id number] No TAKE ONE TABLET BY MOUTH EVERY EVENING [name], [name], MD Taking Active   Blood Glucose Monitoring Suppl (ACCU-CHEK GUIDE) w/Device KIT [id number] No 1 Units by Does not apply route 3 (three) times daily. Terry Beard, MD Taking Active   Blood Glucose Monitoring Suppl (BLOOD GLUCOSE MONITOR SYSTEM) w/Device KIT [id number]  Use up to 4 (four) times daily as directed [name], MD  Active   carvedilol (COREG) 25 MG tablet [id number] No TAKE ONE TABLET BY MOUTH EVERY MORNING and TAKE ONE TABLET BY MOUTH EVERY EVENING  [name], [name], MD Taking Active   COMFORT EZ PEN NEEDLES 31G X 6 MM [place] [id number] No USE AS DIRECTED AT BEDTIME Terry Beard, MD Taking Active   COMFORT EZ PEN NEEDLES 32G X 6 MM MISC  [id number] No  [provider] Taking Active   DULoxetine (CYMBALTA) 60 MG capsule [id number]  Take 1 capsule (60 mg total) by mouth daily. Terry Beard, MD  Active   empagliflozin (JARDIANCE) 25 MG TABS tablet [id number]  TAKE 1 TABLET (25 MG TOTAL) BY MOUTH DAILY BEFORE BREAKFAST. Terry Beard, MD  Active   esomeprazole (NEXIUM) 40 MG capsule [id number] No Take 1 capsule (40 mg total) by mouth 2 (two) times daily before a meal. Mansouraty, [name], MD Taking Active   ezetimibe (ZETIA) 10 MG tablet [id number]  TAKE ONE TABLET BY MOUTH EVERY EVENING [name], [name], MD  Active   glucose blood (ACCU-CHEK GUIDE) test strip [id number]  USE TO check blood glucose THREE TIMES DAILY AS DIRECTED Lacinda Axon, MD  Active   insulin glargine (LANTUS SOLOSTAR) 100 UNIT/ML Solostar Pen [id number]  Inject 52 Units into the skin daily. Lacinda Axon, MD  Active   Ipratropium-Albuterol (COMBIVENT RESPIMAT) 20-100 MCG/ACT AERS respimat [id number]  Inhale 1 puff into the lungs every 6 (six) hours. [name], MD  Active   rivaroxaban (XARELTO) 15 MG TABS tablet [id number]  Take 1 tablet (15 mg total) by mouth daily with breakfast. [name], [name], MD  Active   sacubitril-valsartan (ENTRESTO) 97-103 MG [id number] No TAKE ONE TABLET BY MOUTH EVERY MORNING and TAKE ONE TABLET BY MOUTH EVERY EVENING [name], [name], MD Taking Active   Semaglutide, 2 MG/DOSE, (OZEMPIC, 2 MG/DOSE,) 8 MG/3ML SOPN [id number]  Inject 2 mg into the skin once a week. Terry Beard, MD  Active   spironolactone (ALDACTONE) 50 MG tablet [id number] No TAKE ONE TABLET BY MOUTH EVERY MORNING [name], [name], MD Taking Active   torsemide (DEMADEX) 20 MG tablet [id number]  TAKE TWO TABLETS BY MOUTH EVERY MORNING [name], [name], MD  Active            Patient Active Problem List   Diagnosis Date Noted   Recurrent abdominal hernia without obstruction or gangrene 05/22/2021   Pyrosis 05/22/2021   Indigestion 14/23/9532    Periumbilical pain 02/33/4356   Abdominal pain, epigastric 05/22/2021   Class 3 obesity ([place]) 11/10/2020   S/P TURP 11/10/2020   History of iron deficiency anemia 12/16/2019   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   Depression 01/31/2019   Iron deficiency anemia 04/28/2018   CKD stage 3 due to type 2 diabetes mellitus ([place]) 02/11/2018   Healthcare maintenance 02/11/2018   Persistent atrial fibrillation    Vitamin D deficiency 06/22/2016   Allergic rhinitis 10/19/2012   Diabetic retinopathy ([place]) 10/04/2012   Type 2 diabetes mellitus with peripheral neuropathy ([place]) 04/08/2008   Obstructive sleep apnea    Morbid obesity ([place])    Hyperlipidemia associated with type 2 diabetes mellitus ([place])    Hypertension associated with diabetes ([place])    Chronic combined systolic and diastolic CHF (congestive heart failure) ([place]) 03/01/2006   Conditions to be addressed/monitored per PCP order:  Chronic healthcare management needs, HTN, DM, HF, OSA, vision loss, PAF, HLD, PAF, chronic pain, CKD  Care Plan : CCM RN- Diabetes Type 2 (Adult)  Updates made by Terry Medicus, RN since 06/15/2022 12:00 AM     Problem: Glycemic Management (Diabetes, Type 2)   Priority: High  Onset Date: 06/03/2020  Long-Range Goal: Glycemic Management Optimized   Start Date: 07/20/2019  Expected End Date: 09/14/2022  Recent Progress: Not on track  Priority: High  Note:    CARE PLAN ENTRY (see longitudinal plan of care for additional care plan information)  Current Barriers:  Knowledge Deficits related to basic Diabetes pathophysiology and self care/management Knowledge Deficits related to medications used for management of diabetes 06/15/22:  patient not checking blood sugars-states he is out of some of his meds and will pick up tomorrow. Case Manager Clinical Goal(s):  Over the next 30-60  days, patient will demonstrate improved adherence to prescribed treatment plan for diabetes self care/management as evidenced  by:  daily monitoring and recording of CBG  adherence to ADA/ carb modified diet adherence to prescribed medication regimen  Interventions:  Reviewed education with patient about basic DM disease process Reviewed medications and assessed medication taking behavior Ensured patient has ample supply of all prescribed medications Advised patient, providing education and rationale, to check CBG at least daily and record, calling provider and/or CCM RN for findings outside established parameters. Educated patient that leg and foot pain will likely decrease if blood sugar control is improved and a CGM could provide that assistance  Discussed plans with patient for ongoing care management follow up and provided patient with direct contact information for care management team Review of patient status, including review of consultants reports, relevant laboratory and other test results, and medications completed. Collaborated with [facility] for insulin. 04/16/22:  Patient provided with [facility] phone number.  Patient Self Care Activities:  UNABLE to independently self manage DM Self administers oral medications as prescribed Self administers insulin as prescribed Self administers injectable DM medication Xultophy as prescribed Attends all scheduled provider appointments Checks blood sugars as prescribed and utilize hyper and hypoglycemia protocol as needed Adheres to prescribed ADA/carb modified check blood sugar at prescribed times - check blood sugar if I feel it is too high or too low - enter blood sugar readings and medication or insulin into my phone log - take the blood sugar log to all doctor visits - take the blood sugar meter to all doctor visits    Care Plan : CCM RN- Chronic Pain (Adult)- lower extremeties  Updates made by Terry Medicus, RN since 06/15/2022 12:00 AM     Problem: Chronic Pain Management (Chronic Pain)   Priority: High  Onset Date: 07/15/2020     Long-Range Goal:  Chronic Pain Managed   Start Date: 07/15/2020  Expected End Date: 09/14/2022  Recent Progress: On track  Priority: High  Note:   Current Barriers:  Knowledge Deficits related to self-health management of chronic lower extremity pain Chronic Disease Management support and education needs related to chronic pain 06/15/22: some lower extremity swelling-not wearing compression socks/stockings Clinical Goal(s):  patient will verbalize understanding of plan for pain management.  and patient will use pharmacological and nonpharmacological pain relief strategies as prescribed.  Interventions:  Pain assessment performed Medications reviewed Discussed plans with patient for ongoing care management follow up and provided patient with direct contact information for care management team Discussed improvement  in DM control will likely result in less pain in legs and feet  Patient Goals/Self Care Activities:  Will self-administer medications as prescribed Will attend all scheduled provider appointments Will call pharmacy for medication refills 7 days prior to needed refill date Patient will calls provider office for new concerns or questions Patient will get appt scheduled with general surgeon Follow Up Plan: The care management  team will reach out to the patient again over the next 30-60 days.     Care Plan : General Plan of Care (Adult)  Updates made by Terry Medicus, RN since 06/15/2022 12:00 AM     Problem: Health Promotion or Disease Self-Management (General Plan of Care)   Priority: Medium  Onset Date: 10/07/2020     Long-Range Goal: Self-Management Plan Developed   Start Date: 10/07/2020  Expected End Date: 09/14/2022  Recent Progress: On track  Priority: Medium  Note:   Current Barriers:  Ineffective Self Health Maintenance Currently UNABLE TO independently self manage needs related to chronic health conditions.  Knowledge Deficits related to short term plan for care coordination needs  and long term plans for chronic disease management needs 06/15/22:  patient out of some of his meds, states he will pick up medications tomorrow and follow up with providers. Nurse Case Manager Clinical Goal(s):  patient will work with care management team to address care coordination and chronic disease management needs related to Disease Management Educational Needs Care Coordination Medication Management and Education Medication Reconciliation Medication Assistance  Psychosocial Support   Interventions:  Evaluation of current treatment plan and patient's adherence to plan as established by provider. Reviewed medications with patient. Collaborated with pharmacy regarding referrals. Collaborated with PCP for referrals-completed Collaborated with Care Guide for eyeglass provider that accepts patient's insurance-completed Care Guide referral for eyeglass provider-completed Discussed plans with patient for ongoing care management follow up and provided patient with direct contact information for care management team Advised patient, providing education and rationale, to monitor blood pressure daily and record, calling provider for findings outside established parameters.  Reviewed scheduled/upcoming provider appointments. Collaborated with PCP for general surgeon and podiatry referral-completed Advised patient, providing education and rationale, to check cbg and record, calling provider for findings outside established parameters.   Pharmacy referral for medication review. Advised patient to contact insurance company regarding resumption of aide service-completed Care Guide referral for first time home buyer resources-completed Collaborated with Care Guide for first time home buyer resources Collaborated with [facility] Self Care Activities:  Patient will self administer medications as prescribed Patient will attend all scheduled provider appointments Patient will call pharmacy for  medication refills Patient will continue to perform ADL's independently Patient will call provider office for new concerns or questions Patient Goals:. In the next 30 days, patient will attend all appointments. In the next 30 days, patient will follow up on meter. In the next 30 days, patient will call insurance company and surgeon to schedule an appt. - Follow Up Plan: The patient has been provided with contact information for the care management team and has been advised to call with any health related questions or concerns.  The care management team will reach out to the patient again over the next 30 business  days.     Follow Up:  Patient agrees to Care Plan and Follow-up.  Plan: The Managed Medicaid care management team will reach out to the patient again over the next 30 business  days. and The  Patient has been provided with contact information for the Managed Medicaid care management team and has been advised to call with any health related questions or concerns.  Date/time of next scheduled RN care management/care coordination outreach:  07/15/22 at 1030

## 2022-06-15 NOTE — Patient Instructions (Signed)
Hi [name], thanks for speaking with me-don't forget to get your medications.  [name] was given information about Medicaid Managed Care team care coordination services as a part of their [place] Medicaid benefit. Terry Parks verbally consented to engagement with the [facility] Managed Care team.   If you are experiencing a medical emergency, please call 911 or report to your local emergency department or urgent care.   If you have a non-emergency medical problem during routine business hours, please contact your provider's office and ask to speak with a nurse.   For questions related to your [facility], please call: [phone] or visit the homepage here: [email or web address]  If you would like to schedule transportation through your [facility], please call the following number at least 2 days in advance of your appointment: [phone]   Rides for urgent appointments can also be made after hours by calling Member Services.  Call the [facility] at [phone], at any time, 24 hours a day, 7 days a week. If you are in danger or need immediate medical attention call 911.  If you would like help to quit smoking, call 1-800-QUIT-NOW 269-186-2133) OR Espaol: 1-855-Djelo-Ya (6-073-710-6269) o para ms informacin haga clic aqu or Text READY to 200-400 to register via text  [name] - following are the goals we discussed in your visit today:   Goals Addressed             This Visit's Progress    Manage My Medicine       Timeframe:  Short-Term Goal Priority:  High Start Date:                             Expected End Date:                       Follow Up Date:  07/15/22   - call for medicine refill 2 or 3 days before it runs out - call if I am sick and can't take my medicine - keep a list of all the medicines I take;  vitamins and herbals too - use a pillbox to sort medicine    Why is this important?   These steps will help you keep on track with your medicines.   06/15/22:  patient not compliant with medications    Monitor and Manage My Blood Sugar-Diabetes Type 2       Timeframe:  Long-Range Goal Priority:  High Start Date:        06/03/20                     Expected End Date:   ongoing               Follow Up Date:06/15/22   - check blood sugar at prescribed times - check blood sugar if I feel it is too high or too low - enter blood sugar readings and medication or insulin into my phone log - take the blood sugar log to all doctor visits - take the blood sugar meter to all doctor visits    Why is this important?   Checking your blood sugar at home helps to keep it from getting very high or very low.  Writing the results in a diary or log helps the doctor know how to care for you.  Your blood sugar log  should have the time, date and the results.  Also, write down the amount of insulin or other medicine that you take.  Other information, like what you ate, exercise done and how you were feeling, will also be helpful.     Notes:-  06/15/22:    Patient does not check blood sugars    Protect My Health       Timeframe:  Long-Range Goal Priority:  Medium Start Date:      10/07/20                       Expected End Date:  ongoing                Follow Up Date: 06/15/22   - schedule appointment for flu shot - schedule appointment for vaccines needed due to my age or health - schedule recommended health tests. - schedule and keep appointment for annual check-up   Why is this important?   Screening tests can find diseases early when they are easier to treat.  Your doctor or nurse will talk with you about which tests are important for you.  Getting shots for common diseases like the flu and shingles will help prevent them.   06/15/22:  Patient to schedule f/u appts.     Track and Manage Fluids  and Swelling-Heart Failure       Timeframe:  Long-Range Goal Priority:  High Start Date:          01/14/20                   Expected End Date:   ongoing     Follow Up Date: 07/15/22   - call office if I gain more than 2 pounds in one day or 5 pounds in one week - do ankle pumps when sitting - use salt in moderation - watch for swelling in feet, ankles and legs every day - weigh myself daily  - log weight in my phone   Why is this important?   It is important to check your weight daily and watch how much salt and liquids you have.  It will help you to manage your heart failure.    Notes: 06/15/22:  patient not checking weight or wearing compression stockings or socks    Track and Manage My Blood Pressure-Hypertension       Timeframe:  Long-Range Goal Priority:  High Start Date:     06/03/20                        Expected End Date:  ongoing                 Follow Up Date: 07/15/22  - check blood pressure daily - write blood pressure results in my phone log    Why is this important?   You won't feel high blood pressure, but it can still hurt your blood vessels.  High blood pressure can cause heart or kidney problems. It can also cause a stroke.  Making lifestyle changes like losing a little weight or eating less salt will help.  Checking your blood pressure at home and at different times of the day can help to control blood pressure.  If the doctor prescribes medicine remember to take it the way the doctor ordered.  Call the office if you cannot afford the medicine or if there are questions about it 06/15/22:  does not check blood  pressure, 124/79 at PCP appt.   Patient verbalizes understanding of instructions and care plan provided today and agrees to view in [place]. Active MyChart status and patient understanding of how to access instructions and care plan via MyChart confirmed with patient.     The Managed Medicaid care management team will reach out to the patient again over  the next 30 business  days.  The  Patient  has been provided with contact information for the Managed Medicaid care management team and has been advised to call with any health related questions or concerns.   Terry Raider RN, BSN [place] Management Coordinator - Managed Medicaid High Risk [phone]   Following is a copy of your plan of care:  Care Plan : CCM RN- Diabetes Type 2 (Adult)  Updates made by [name], RN since 06/15/2022 12:00 AM     Problem: Glycemic Management (Diabetes, Type 2)   Priority: High  Onset Date: 06/03/2020     Long-Range Goal: Glycemic Management Optimized   Start Date: 07/20/2019  Expected End Date: 09/14/2022  Recent Progress: Not on track  Priority: High  Note:    CARE PLAN ENTRY (see longitudinal plan of care for additional care plan information)  Current Barriers:  Knowledge Deficits related to basic Diabetes pathophysiology and self care/management Knowledge Deficits related to medications used for management of diabetes 06/15/22:  patient not checking blood sugars-states he is out of some of his meds and will pick up tomorrow. Case Manager Clinical Goal(s):  Over the next 30-60  days, patient will demonstrate improved adherence to prescribed treatment plan for diabetes self care/management as evidenced by:  daily monitoring and recording of CBG  adherence to ADA/ carb modified diet adherence to prescribed medication regimen  Interventions:  Reviewed education with patient about basic DM disease process Reviewed medications and assessed medication taking behavior Ensured patient has ample supply of all prescribed medications Advised patient, providing education and rationale, to check CBG at least daily and record, calling provider and/or CCM RN for findings outside established parameters. Educated patient that leg and foot pain will likely decrease if blood sugar control is improved and a CGM could  provide that assistance  Discussed plans with patient for ongoing care management follow up and provided patient with direct contact information for care management team Review of patient status, including review of consultants reports, relevant laboratory and other test results, and medications completed. Collaborated with [facility] for insulin. 04/16/22:  Patient provided with [facility] phone number.  Patient Self Care Activities:  UNABLE to independently self manage DM Self administers oral medications as prescribed Self administers insulin as prescribed Self administers injectable DM medication Xultophy as prescribed Attends all scheduled provider appointments Checks blood sugars as prescribed and utilize hyper and hypoglycemia protocol as needed Adheres to prescribed ADA/carb modified check blood sugar at prescribed times - check blood sugar if I feel it is too high or too low - enter blood sugar readings and medication or insulin into my phone log - take the blood sugar log to all doctor visits - take the blood sugar meter to all doctor visits    Care Plan : CCM RN- Chronic Pain (Adult)- lower extremeties  Updates made by [name], RN since 06/15/2022 12:00 AM     Problem: Chronic Pain Management (Chronic Pain)   Priority: High  Onset Date: 07/15/2020     Long-Range Goal: Chronic Pain Managed   Start Date: 07/15/2020  Expected End Date: 09/14/2022  Recent Progress: On track  Priority: High  Note:   Current Barriers:  Knowledge Deficits related to self-health management of chronic lower extremity pain Chronic Disease Management support and education needs related to chronic pain 06/15/22: some lower extremity swelling-not wearing compression socks/stockings Clinical Goal(s):  patient will verbalize understanding of plan for pain management.  and patient will use pharmacological and nonpharmacological pain relief strategies as prescribed.  Interventions:  Pain assessment  performed Medications reviewed Discussed plans with patient for ongoing care management follow up and provided patient with direct contact information for care management team Discussed improvement  in DM control will likely result in less pain in legs and feet  Patient Goals/Self Care Activities:  Will self-administer medications as prescribed Will attend all scheduled provider appointments Will call pharmacy for medication refills 7 days prior to needed refill date Patient will calls provider office for new concerns or questions Patient will get appt scheduled with general surgeon Follow Up Plan: The care management team will reach out to the patient again over the next 30-60 days.    Care Plan : General Plan of Care (Adult)  Updates made by [name], RN since 06/15/2022 12:00 AM     Problem: Health Promotion or Disease Self-Management (General Plan of Care)   Priority: Medium  Onset Date: 10/07/2020     Long-Range Goal: Self-Management Plan Developed   Start Date: 10/07/2020  Expected End Date: 09/14/2022  Recent Progress: On track  Priority: Medium  Note:   Current Barriers:  Ineffective Self Health Maintenance Currently UNABLE TO independently self manage needs related to chronic health conditions.  Knowledge Deficits related to short term plan for care coordination needs and long term plans for chronic disease management needs 06/15/22:  patient out of some of his meds, states he will pick up medications tomorrow and follow up with providers. Nurse Case Manager Clinical Goal(s):  patient will work with care management team to address care coordination and chronic disease management needs related to Disease Management Educational Needs Care Coordination Medication Management and Education Medication Reconciliation Medication Assistance  Psychosocial Support   Interventions:  Evaluation of current treatment plan and patient's adherence to plan as established by  provider. Reviewed medications with patient. Collaborated with pharmacy regarding referrals. Collaborated with PCP for referrals-completed Collaborated with Care Guide for eyeglass provider that accepts patient's insurance-completed Care Guide referral for eyeglass provider-completed Discussed plans with patient for ongoing care management follow up and provided patient with direct contact information for care management team Advised patient, providing education and rationale, to monitor blood pressure daily and record, calling provider for findings outside established parameters.  Reviewed scheduled/upcoming provider appointments. Collaborated with PCP for general surgeon and podiatry referral-completed Advised patient, providing education and rationale, to check cbg and record, calling provider for findings outside established parameters.   Pharmacy referral for medication review. Advised patient to contact insurance company regarding resumption of aide service-completed Care Guide referral for first time home buyer resources-completed Collaborated with Care Guide for first time home buyer resources Collaborated with [facility] Self Care Activities:  Patient will self administer medications as prescribed Patient will attend all scheduled provider appointments Patient will call pharmacy for medication refills Patient will continue to perform ADL's independently Patient will call provider office for new concerns or questions Patient Goals:. In the next 30 days, patient will attend all appointments. In the next 30 days, patient will follow up on meter. In the next 30 days, patient will  call insurance company and surgeon to schedule an appt. - Follow Up Plan: The patient has been provided with contact information for the care management team and has been advised to call with any health related questions or concerns.  The care management team will reach out to the patient again over the next  30 business  days.

## 2022-06-16 ENCOUNTER — Encounter: Payer: Medicaid Other | Admitting: Student

## 2022-06-22 ENCOUNTER — Emergency Department (HOSPITAL_BASED_OUTPATIENT_CLINIC_OR_DEPARTMENT_OTHER): Payer: Medicaid Other

## 2022-06-22 ENCOUNTER — Observation Stay (HOSPITAL_BASED_OUTPATIENT_CLINIC_OR_DEPARTMENT_OTHER)
Admission: EM | Admit: 2022-06-22 | Discharge: 2022-06-24 | Disposition: A | Discharge status: Home / Self Care | Payer: Medicaid Other | Attending: Emergency Medicine | Admitting: Emergency Medicine

## 2022-06-22 ENCOUNTER — Other Ambulatory Visit: Payer: Self-pay

## 2022-06-22 ENCOUNTER — Encounter (HOSPITAL_BASED_OUTPATIENT_CLINIC_OR_DEPARTMENT_OTHER): Payer: Self-pay

## 2022-06-22 DIAGNOSIS — E785 Hyperlipidemia, unspecified: Secondary | ICD-10-CM | POA: Diagnosis present

## 2022-06-22 DIAGNOSIS — I4819 Other persistent atrial fibrillation: Secondary | ICD-10-CM | POA: Insufficient documentation

## 2022-06-22 DIAGNOSIS — F419 Anxiety disorder, unspecified: Secondary | ICD-10-CM | POA: Diagnosis present

## 2022-06-22 DIAGNOSIS — K2990 Gastroduodenitis, unspecified, without bleeding: Secondary | ICD-10-CM | POA: Diagnosis present

## 2022-06-22 DIAGNOSIS — J45909 Unspecified asthma, uncomplicated: Secondary | ICD-10-CM | POA: Diagnosis not present

## 2022-06-22 DIAGNOSIS — G4733 Obstructive sleep apnea (adult) (pediatric): Secondary | ICD-10-CM | POA: Diagnosis present

## 2022-06-22 DIAGNOSIS — Z833 Family history of diabetes mellitus: Secondary | ICD-10-CM

## 2022-06-22 DIAGNOSIS — R000 Tachycardia, unspecified: Secondary | ICD-10-CM | POA: Diagnosis not present

## 2022-06-22 DIAGNOSIS — E1165 Type 2 diabetes mellitus with hyperglycemia: Secondary | ICD-10-CM | POA: Diagnosis present

## 2022-06-22 DIAGNOSIS — E1122 Type 2 diabetes mellitus with diabetic chronic kidney disease: Secondary | ICD-10-CM | POA: Diagnosis present

## 2022-06-22 DIAGNOSIS — R109 Unspecified abdominal pain: Secondary | ICD-10-CM | POA: Diagnosis not present

## 2022-06-22 DIAGNOSIS — I429 Cardiomyopathy, unspecified: Secondary | ICD-10-CM | POA: Diagnosis present

## 2022-06-22 DIAGNOSIS — K922 Gastrointestinal hemorrhage, unspecified: Secondary | ICD-10-CM | POA: Diagnosis present

## 2022-06-22 DIAGNOSIS — K2970 Gastritis, unspecified, without bleeding: Secondary | ICD-10-CM | POA: Diagnosis not present

## 2022-06-22 DIAGNOSIS — Z7901 Long term (current) use of anticoagulants: Secondary | ICD-10-CM | POA: Diagnosis not present

## 2022-06-22 DIAGNOSIS — I5042 Chronic combined systolic (congestive) and diastolic (congestive) heart failure: Secondary | ICD-10-CM | POA: Diagnosis not present

## 2022-06-22 DIAGNOSIS — E669 Obesity, unspecified: Secondary | ICD-10-CM | POA: Diagnosis present

## 2022-06-22 DIAGNOSIS — Z7984 Long term (current) use of oral hypoglycemic drugs: Secondary | ICD-10-CM

## 2022-06-22 DIAGNOSIS — Z79899 Other long term (current) drug therapy: Secondary | ICD-10-CM | POA: Insufficient documentation

## 2022-06-22 DIAGNOSIS — K2101 Gastro-esophageal reflux disease with esophagitis, with bleeding: Secondary | ICD-10-CM | POA: Diagnosis present

## 2022-06-22 DIAGNOSIS — K449 Diaphragmatic hernia without obstruction or gangrene: Secondary | ICD-10-CM | POA: Diagnosis present

## 2022-06-22 DIAGNOSIS — Z8249 Family history of ischemic heart disease and other diseases of the circulatory system: Secondary | ICD-10-CM

## 2022-06-22 DIAGNOSIS — Z794 Long term (current) use of insulin: Secondary | ICD-10-CM | POA: Insufficient documentation

## 2022-06-22 DIAGNOSIS — I110 Hypertensive heart disease with heart failure: Secondary | ICD-10-CM | POA: Diagnosis not present

## 2022-06-22 DIAGNOSIS — K2100 Gastro-esophageal reflux disease with esophagitis, without bleeding: Secondary | ICD-10-CM | POA: Diagnosis present

## 2022-06-22 DIAGNOSIS — N1832 Chronic kidney disease, stage 3b: Secondary | ICD-10-CM | POA: Diagnosis present

## 2022-06-22 DIAGNOSIS — Z8719 Personal history of other diseases of the digestive system: Secondary | ICD-10-CM | POA: Diagnosis present

## 2022-06-22 DIAGNOSIS — E1151 Type 2 diabetes mellitus with diabetic peripheral angiopathy without gangrene: Secondary | ICD-10-CM | POA: Diagnosis not present

## 2022-06-22 DIAGNOSIS — Z8546 Personal history of malignant neoplasm of prostate: Secondary | ICD-10-CM | POA: Insufficient documentation

## 2022-06-22 DIAGNOSIS — Z6836 Body mass index (BMI) 36.0-36.9, adult: Secondary | ICD-10-CM

## 2022-06-22 DIAGNOSIS — Z9079 Acquired absence of other genital organ(s): Secondary | ICD-10-CM

## 2022-06-22 DIAGNOSIS — Z823 Family history of stroke: Secondary | ICD-10-CM

## 2022-06-22 DIAGNOSIS — E66813 Obesity, class 3: Secondary | ICD-10-CM | POA: Diagnosis present

## 2022-06-22 DIAGNOSIS — K5790 Diverticulosis of intestine, part unspecified, without perforation or abscess without bleeding: Secondary | ICD-10-CM | POA: Diagnosis present

## 2022-06-22 DIAGNOSIS — D6832 Hemorrhagic disorder due to extrinsic circulating anticoagulants: Secondary | ICD-10-CM | POA: Diagnosis present

## 2022-06-22 DIAGNOSIS — Z888 Allergy status to other drugs, medicaments and biological substances status: Secondary | ICD-10-CM

## 2022-06-22 DIAGNOSIS — I700 Atherosclerosis of aorta: Secondary | ICD-10-CM | POA: Diagnosis not present

## 2022-06-22 DIAGNOSIS — Z803 Family history of malignant neoplasm of breast: Secondary | ICD-10-CM

## 2022-06-22 DIAGNOSIS — N179 Acute kidney failure, unspecified: Secondary | ICD-10-CM | POA: Diagnosis present

## 2022-06-22 DIAGNOSIS — R1110 Vomiting, unspecified: Secondary | ICD-10-CM | POA: Diagnosis not present

## 2022-06-22 DIAGNOSIS — K861 Other chronic pancreatitis: Secondary | ICD-10-CM | POA: Diagnosis present

## 2022-06-22 DIAGNOSIS — K5900 Constipation, unspecified: Secondary | ICD-10-CM | POA: Diagnosis present

## 2022-06-22 DIAGNOSIS — I4811 Longstanding persistent atrial fibrillation: Secondary | ICD-10-CM | POA: Diagnosis present

## 2022-06-22 DIAGNOSIS — Z7902 Long term (current) use of antithrombotics/antiplatelets: Secondary | ICD-10-CM

## 2022-06-22 DIAGNOSIS — K920 Hematemesis: Secondary | ICD-10-CM | POA: Diagnosis not present

## 2022-06-22 DIAGNOSIS — N400 Enlarged prostate without lower urinary tract symptoms: Secondary | ICD-10-CM | POA: Diagnosis present

## 2022-06-22 DIAGNOSIS — K2211 Ulcer of esophagus with bleeding: Secondary | ICD-10-CM | POA: Diagnosis not present

## 2022-06-22 DIAGNOSIS — F32A Depression, unspecified: Secondary | ICD-10-CM | POA: Diagnosis present

## 2022-06-22 DIAGNOSIS — M5137 Other intervertebral disc degeneration, lumbosacral region: Secondary | ICD-10-CM | POA: Diagnosis present

## 2022-06-22 DIAGNOSIS — Z87442 Personal history of urinary calculi: Secondary | ICD-10-CM

## 2022-06-22 DIAGNOSIS — I130 Hypertensive heart and chronic kidney disease with heart failure and stage 1 through stage 4 chronic kidney disease, or unspecified chronic kidney disease: Secondary | ICD-10-CM | POA: Diagnosis present

## 2022-06-22 DIAGNOSIS — T45515A Adverse effect of anticoagulants, initial encounter: Secondary | ICD-10-CM | POA: Diagnosis present

## 2022-06-22 DIAGNOSIS — E1142 Type 2 diabetes mellitus with diabetic polyneuropathy: Secondary | ICD-10-CM | POA: Diagnosis present

## 2022-06-22 LAB — CBG MONITORING, ED: Glucose-Capillary: 227 mg/dL — ABNORMAL HIGH (ref 70–99)

## 2022-06-22 LAB — CBC
HCT: 50.8 % (ref 39.0–52.0)
Hemoglobin: 17 g/dL (ref 13.0–17.0)
MCH: 27.7 pg (ref 26.0–34.0)
MCHC: 33.5 g/dL (ref 30.0–36.0)
MCV: 82.9 fL (ref 80.0–100.0)
Platelets: 192 10*3/uL (ref 150–400)
RBC: 6.13 MIL/uL — ABNORMAL HIGH (ref 4.22–5.81)
RDW: 13 % (ref 11.5–15.5)
WBC: 8.8 10*3/uL (ref 4.0–10.5)
nRBC: 0 % (ref 0.0–0.2)

## 2022-06-22 LAB — COMPREHENSIVE METABOLIC PANEL
ALT: 12 U/L (ref 0–44)
AST: 13 U/L — ABNORMAL LOW (ref 15–41)
Albumin: 4.3 g/dL (ref 3.5–5.0)
Alkaline Phosphatase: 93 U/L (ref 38–126)
Anion gap: 14 (ref 5–15)
BUN: 23 mg/dL (ref 8–23)
CO2: 26 mmol/L (ref 22–32)
Calcium: 9.7 mg/dL (ref 8.9–10.3)
Chloride: 95 mmol/L — ABNORMAL LOW (ref 98–111)
Creatinine, Ser: 1.94 mg/dL — ABNORMAL HIGH (ref 0.61–1.24)
GFR, Estimated: 38 mL/min — ABNORMAL LOW (ref >60)
Glucose, Bld: 215 mg/dL — ABNORMAL HIGH (ref 70–99)
Potassium: 4 mmol/L (ref 3.5–5.1)
Sodium: 135 mmol/L (ref 135–145)
Total Bilirubin: 0.8 mg/dL (ref 0.3–1.2)
Total Protein: 8.4 g/dL — ABNORMAL HIGH (ref 6.5–8.1)

## 2022-06-22 LAB — OCCULT BLOOD X 1 CARD TO LAB, STOOL: Fecal Occult Bld: NEGATIVE

## 2022-06-22 LAB — LIPASE, BLOOD: Lipase: 205 U/L — ABNORMAL HIGH (ref 11–51)

## 2022-06-22 MED ORDER — LACTATED RINGERS IV BOLUS
2000.0000 mL | Freq: Once | INTRAVENOUS | Status: AC
  Administered 2022-06-22: 2000 mL via INTRAVENOUS

## 2022-06-22 MED ORDER — PANTOPRAZOLE SODIUM 40 MG IV SOLR
40.0000 mg | Freq: Once | INTRAVENOUS | Status: AC
  Administered 2022-06-22: 40 mg via INTRAVENOUS
  Filled 2022-06-22: qty 10

## 2022-06-22 NOTE — ED Notes (Signed)
Pt ambulated to restroom without assistance.

## 2022-06-22 NOTE — ED Notes (Signed)
BM x1. No blood noted.

## 2022-06-22 NOTE — ED Notes (Signed)
Report given to [place], [place] for room [phone].

## 2022-06-22 NOTE — ED Notes (Signed)
Popsicle provided to pt. Respirations are equal and nonlabored. Skin warm and dry. Family remains at bedside. Denies needs at this time.

## 2022-06-22 NOTE — Plan of Care (Signed)
   Patient Name: Terry Parks, Terry Parks DOB: 03-24-1956 MRN: [id number] Transferring facility: [facility] Requesting provider: Deatra Canter, [place] Reason for transfer:  67 yo AAM with hx of erosive gastritis. suppose to take PPI but isn't taking it. 3 episodes of hematemesis last night and today. EDP spoke with GI([facility] Dr. Henrene Pastor). pt is a [facility] patient. Going to: [facility] Admission Status: observation  Bed Type: med/surg To Do: notify [facility] of their clinic patient needing to be admitted.  Kristopher Oppenheim, DO [facility]

## 2022-06-22 NOTE — ED Triage Notes (Signed)
Patient here POV from Home.  Endorses Vomiting that began Yesterday PM. Associated with some Blood in Emesis as well last PM.   No ABD Pain. No Diarrhea. No Fevers.   NAD Noted during Triage. A&Ox4. GCS 15. Ambulatory.

## 2022-06-22 NOTE — ED Provider Notes (Signed)
[facility] Provider Note   CSN: [id number] Arrival date & time: 06/22/22  1845     History {Add pertinent medical, surgical, social history, OB history to HPI:1} Chief Complaint  Patient presents with   Vomiting    Terry Parks is a 67 y.o. male.  67 year old male presents today for evaluation of hematemesis.  States he has had 3 episodes since midnight last night.  He states he has bright red blood towards the beginning of his emesis which turns to dark black towards the end.  He has not had a bowel movement in the past 2 days so he is unsure if he has had any melanotic stools.  States he had a heavy dinner followed by snack and afterwards which he feels might of brought on his symptoms.  Patient is on Xarelto which she is compliant with.  Denies abdominal pain, chest pain, shortness of breath.  The history is provided by the patient. No language interpreter was used.       Home Medications Prior to Admission medications   Medication Sig Start Date End Date Taking? Authorizing Provider  Accu-Chek Softclix Lancets lancets Check blood sugar three times a day as instructed 05/13/22   Lacinda Axon, MD  amiodarone (PACERONE) 200 MG tablet TAKE ONE TABLET BY MOUTH EVERY MORNING 11/02/21   [name], [name], MD  amLODipine (NORVASC) 5 MG tablet TAKE ONE TABLET BY MOUTH EVERY EVENING 06/22/21   [name], [name], MD  amoxicillin (AMOXIL) 500 MG capsule Take 1 capsule (500 mg total) by mouth every 6 (six) hours until gone. 10/01/21     atorvastatin (LIPITOR) 40 MG tablet TAKE ONE TABLET BY MOUTH EVERY EVENING 06/22/21   [name], [name], MD  Blood Glucose Monitoring Suppl (ACCU-CHEK GUIDE) w/Device KIT 1 Units by Does not apply route 3 (three) times daily. 11/25/20   [name], MD  Blood Glucose Monitoring Suppl (BLOOD GLUCOSE MONITOR SYSTEM) w/Device KIT Use up to 4 (four) times daily as directed 10/16/21   [name], MD  carvedilol  (COREG) 25 MG tablet TAKE ONE TABLET BY MOUTH EVERY MORNING and TAKE ONE TABLET BY MOUTH EVERY EVENING 07/13/21   [name], [name], MD  COMFORT EZ PEN NEEDLES 31G X 6 MM MISC USE AS DIRECTED AT BEDTIME 06/23/21   [name], MD  COMFORT EZ PEN NEEDLES 32G X 6 MM MISC  05/25/21   [provider]  DULoxetine (CYMBALTA) 60 MG capsule Take 1 capsule (60 mg total) by mouth daily. 11/02/21 10/28/22  [name], MD  empagliflozin (JARDIANCE) 25 MG TABS tablet TAKE 1 TABLET (25 MG TOTAL) BY MOUTH DAILY BEFORE BREAKFAST. 11/02/21 11/02/22  [name], MD  esomeprazole (NEXIUM) 40 MG capsule Take 1 capsule (40 mg total) by mouth 2 (two) times daily before a meal. 06/09/21   Mansouraty, [name], MD  ezetimibe (ZETIA) 10 MG tablet TAKE ONE TABLET BY MOUTH EVERY EVENING 05/13/22   [name], [name], MD  glucose blood (ACCU-CHEK GUIDE) test strip USE TO check blood glucose THREE TIMES DAILY AS DIRECTED 05/13/22   Lacinda Axon, MD  insulin glargine (LANTUS SOLOSTAR) 100 UNIT/ML Solostar Pen Inject 52 Units into the skin daily. 05/13/22 05/08/23  Lacinda Axon, MD  Ipratropium-Albuterol (COMBIVENT RESPIMAT) 20-100 MCG/ACT AERS respimat Inhale 1 puff into the lungs every 6 (six) hours. 10/16/21   [name], MD  rivaroxaban (XARELTO) 15 MG TABS tablet Take 1 tablet (15 mg total) by mouth daily with breakfast. 10/06/21  [name], [name], MD  sacubitril-valsartan (ENTRESTO) 97-103 MG TAKE ONE TABLET BY MOUTH EVERY MORNING and TAKE ONE TABLET BY MOUTH EVERY EVENING 07/13/21   [name], [name], MD  Semaglutide, 2 MG/DOSE, (OZEMPIC, 2 MG/DOSE,) 8 MG/3ML SOPN Inject 2 mg into the skin once a week. 04/20/22   [name], MD  spironolactone (ALDACTONE) 50 MG tablet TAKE ONE TABLET BY MOUTH EVERY MORNING 06/22/21   [name], [name], MD  torsemide (DEMADEX) 20 MG tablet TAKE TWO TABLETS BY MOUTH EVERY MORNING 04/20/22   [name], [name], MD      Allergies    Lisinopril    Review of Systems    Review of Systems  Constitutional:  Negative for fever.  Respiratory:  Negative for shortness of breath.   Cardiovascular:  Negative for chest pain.  Gastrointestinal:  Positive for vomiting. Negative for abdominal pain, diarrhea and nausea.  Neurological:  Negative for light-headedness.  All other systems reviewed and are negative.   Physical Exam Updated Vital Signs BP 124/89 (BP Location: Right Arm)   Pulse (!) 111   Temp 97.9 F (36.6 C) (Oral)   Resp 14   Ht '6\' 1"'$  (1.854 m)   Wt 129.4 kg   SpO2 100%   BMI 37.64 kg/m  Physical Exam Vitals and nursing note reviewed.  Constitutional:      General: He is not in acute distress.    Appearance: Normal appearance. He is obese. He is not ill-appearing.  HENT:     Head: Normocephalic and atraumatic.     Nose: Nose normal.  Eyes:     General: No scleral icterus.    Extraocular Movements: Extraocular movements intact.     Conjunctiva/sclera: Conjunctivae normal.  Cardiovascular:     Rate and Rhythm: Normal rate and regular rhythm.     Pulses: Normal pulses.  Pulmonary:     Effort: Pulmonary effort is normal. No respiratory distress.     Breath sounds: Normal breath sounds. No wheezing or rales.  Abdominal:     General: There is no distension.     Tenderness: There is no abdominal tenderness.  Musculoskeletal:        General: Normal range of motion.     Cervical back: Normal range of motion.  Skin:    General: Skin is warm and dry.  Neurological:     General: No focal deficit present.     Mental Status: He is alert. Mental status is at baseline.     ED Results / Procedures / Treatments   Labs (all labs ordered are listed, but only abnormal results are displayed) Labs Reviewed  LIPASE, BLOOD - Abnormal; Notable for the following components:      Result Value   Lipase 205 (*)    All other components within normal limits  COMPREHENSIVE METABOLIC PANEL - Abnormal; Notable for the following components:   Chloride 95  (*)    Glucose, Bld 215 (*)    Creatinine, Ser 1.94 (*)    Total Protein 8.4 (*)    AST 13 (*)    GFR, Estimated 38 (*)    All other components within normal limits  CBC - Abnormal; Notable for the following components:   RBC 6.13 (*)    All other components within normal limits  CBG MONITORING, ED - Abnormal; Notable for the following components:   Glucose-Capillary 227 (*)    All other components within normal limits  OCCULT BLOOD X 1 CARD TO LAB, STOOL    EKG None  Radiology No results found.  Procedures Procedures  {Document cardiac monitor, telemetry assessment procedure when appropriate:1}  Medications Ordered in ED Medications  lactated ringers bolus 2,000 mL (has no administration in time range)  pantoprazole (PROTONIX) injection 40 mg (has no administration in time range)    ED Course/ Medical Decision Making/ A&P   {   Click here for ABCD2, HEART and other calculatorsREFRESH Note before signing :1}                          Medical Decision Making Amount and/or Complexity of Data Reviewed Labs: ordered.  Risk Prescription drug management.   ***  {Document critical care time when appropriate:1} {Document review of labs and clinical decision tools ie heart score, Chads2Vasc2 etc:1}  {Document your independent review of radiology images, and any outside records:1} {Document your discussion with family members, caretakers, and with consultants:1} {Document social determinants of health affecting pt's care:1} {Document your decision making why or why not admission, treatments were needed:1} Final Clinical Impression(s) / ED Diagnoses Final diagnoses:  None    Rx / DC Orders ED Discharge Orders     None

## 2022-06-23 ENCOUNTER — Encounter (HOSPITAL_COMMUNITY): Payer: Self-pay | Admitting: Student in an Organized Health Care Education/Training Program

## 2022-06-23 ENCOUNTER — Observation Stay (HOSPITAL_COMMUNITY): Payer: Medicaid Other | Admitting: Anesthesiology

## 2022-06-23 ENCOUNTER — Telehealth: Payer: Self-pay

## 2022-06-23 ENCOUNTER — Encounter (HOSPITAL_COMMUNITY)
Admission: EM | Disposition: A | Discharge status: Home / Self Care | Payer: Self-pay | Source: Home / Self Care | Attending: Emergency Medicine

## 2022-06-23 DIAGNOSIS — Z79899 Other long term (current) drug therapy: Secondary | ICD-10-CM | POA: Diagnosis not present

## 2022-06-23 DIAGNOSIS — K920 Hematemesis: Principal | ICD-10-CM

## 2022-06-23 DIAGNOSIS — I5042 Chronic combined systolic (congestive) and diastolic (congestive) heart failure: Secondary | ICD-10-CM | POA: Diagnosis not present

## 2022-06-23 DIAGNOSIS — Z7901 Long term (current) use of anticoagulants: Secondary | ICD-10-CM

## 2022-06-23 DIAGNOSIS — K2970 Gastritis, unspecified, without bleeding: Secondary | ICD-10-CM | POA: Diagnosis not present

## 2022-06-23 DIAGNOSIS — Z794 Long term (current) use of insulin: Secondary | ICD-10-CM | POA: Diagnosis not present

## 2022-06-23 DIAGNOSIS — K2210 Ulcer of esophagus without bleeding: Secondary | ICD-10-CM | POA: Diagnosis not present

## 2022-06-23 DIAGNOSIS — K922 Gastrointestinal hemorrhage, unspecified: Secondary | ICD-10-CM | POA: Diagnosis present

## 2022-06-23 DIAGNOSIS — E1142 Type 2 diabetes mellitus with diabetic polyneuropathy: Secondary | ICD-10-CM

## 2022-06-23 DIAGNOSIS — K2081 Other esophagitis with bleeding: Secondary | ICD-10-CM | POA: Diagnosis not present

## 2022-06-23 DIAGNOSIS — E1122 Type 2 diabetes mellitus with diabetic chronic kidney disease: Secondary | ICD-10-CM

## 2022-06-23 DIAGNOSIS — N189 Chronic kidney disease, unspecified: Secondary | ICD-10-CM | POA: Diagnosis not present

## 2022-06-23 DIAGNOSIS — Z8719 Personal history of other diseases of the digestive system: Secondary | ICD-10-CM | POA: Diagnosis present

## 2022-06-23 DIAGNOSIS — I130 Hypertensive heart and chronic kidney disease with heart failure and stage 1 through stage 4 chronic kidney disease, or unspecified chronic kidney disease: Secondary | ICD-10-CM

## 2022-06-23 DIAGNOSIS — I4819 Other persistent atrial fibrillation: Secondary | ICD-10-CM | POA: Diagnosis not present

## 2022-06-23 DIAGNOSIS — I110 Hypertensive heart disease with heart failure: Secondary | ICD-10-CM | POA: Diagnosis not present

## 2022-06-23 DIAGNOSIS — K2100 Gastro-esophageal reflux disease with esophagitis, without bleeding: Secondary | ICD-10-CM | POA: Diagnosis not present

## 2022-06-23 DIAGNOSIS — Z8546 Personal history of malignant neoplasm of prostate: Secondary | ICD-10-CM | POA: Diagnosis not present

## 2022-06-23 DIAGNOSIS — K2971 Gastritis, unspecified, with bleeding: Secondary | ICD-10-CM | POA: Diagnosis not present

## 2022-06-23 DIAGNOSIS — I509 Heart failure, unspecified: Secondary | ICD-10-CM | POA: Diagnosis not present

## 2022-06-23 DIAGNOSIS — J45909 Unspecified asthma, uncomplicated: Secondary | ICD-10-CM | POA: Diagnosis not present

## 2022-06-23 DIAGNOSIS — K2211 Ulcer of esophagus with bleeding: Secondary | ICD-10-CM | POA: Diagnosis not present

## 2022-06-23 DIAGNOSIS — I4811 Longstanding persistent atrial fibrillation: Secondary | ICD-10-CM

## 2022-06-23 DIAGNOSIS — E1151 Type 2 diabetes mellitus with diabetic peripheral angiopathy without gangrene: Secondary | ICD-10-CM | POA: Diagnosis not present

## 2022-06-23 HISTORY — PX: BIOPSY: SHX5522

## 2022-06-23 HISTORY — PX: ESOPHAGOGASTRODUODENOSCOPY (EGD) WITH PROPOFOL: SHX5813

## 2022-06-23 HISTORY — PX: HEMOSTASIS CLIP PLACEMENT: SHX6857

## 2022-06-23 LAB — HIV ANTIBODY (ROUTINE TESTING W REFLEX): HIV Screen 4th Generation wRfx: NONREACTIVE

## 2022-06-23 LAB — BASIC METABOLIC PANEL
Anion gap: 12 (ref 5–15)
BUN: 19 mg/dL (ref 8–23)
CO2: 27 mmol/L (ref 22–32)
Calcium: 8.6 mg/dL — ABNORMAL LOW (ref 8.9–10.3)
Chloride: 98 mmol/L (ref 98–111)
Creatinine, Ser: 1.81 mg/dL — ABNORMAL HIGH (ref 0.61–1.24)
GFR, Estimated: 41 mL/min — ABNORMAL LOW (ref >60)
Glucose, Bld: 148 mg/dL — ABNORMAL HIGH (ref 70–99)
Potassium: 3.4 mmol/L — ABNORMAL LOW (ref 3.5–5.1)
Sodium: 137 mmol/L (ref 135–145)

## 2022-06-23 LAB — CBC
HCT: 45.9 % (ref 39.0–52.0)
Hemoglobin: 15 g/dL (ref 13.0–17.0)
MCH: 27.6 pg (ref 26.0–34.0)
MCHC: 32.7 g/dL (ref 30.0–36.0)
MCV: 84.5 fL (ref 80.0–100.0)
Platelets: 149 10*3/uL — ABNORMAL LOW (ref 150–400)
RBC: 5.43 MIL/uL (ref 4.22–5.81)
RDW: 12.9 % (ref 11.5–15.5)
WBC: 7.3 10*3/uL (ref 4.0–10.5)
nRBC: 0 % (ref 0.0–0.2)

## 2022-06-23 LAB — GLUCOSE, CAPILLARY
Glucose-Capillary: 147 mg/dL — ABNORMAL HIGH (ref 70–99)
Glucose-Capillary: 187 mg/dL — ABNORMAL HIGH (ref 70–99)
Glucose-Capillary: 125 mg/dL — ABNORMAL HIGH (ref 70–99)
Glucose-Capillary: 110 mg/dL — ABNORMAL HIGH (ref 70–99)
Glucose-Capillary: 110 mg/dL — ABNORMAL HIGH (ref 70–99)
Glucose-Capillary: 382 mg/dL — ABNORMAL HIGH (ref 70–99)

## 2022-06-23 LAB — POTASSIUM: Potassium: 3.7 mmol/L (ref 3.5–5.1)

## 2022-06-23 LAB — MAGNESIUM: Magnesium: 2.3 mg/dL (ref 1.7–2.4)

## 2022-06-23 SURGERY — ESOPHAGOGASTRODUODENOSCOPY (EGD) WITH PROPOFOL
Anesthesia: General

## 2022-06-23 MED ORDER — SUCCINYLCHOLINE CHLORIDE 200 MG/10ML IV SOSY
PREFILLED_SYRINGE | INTRAVENOUS | Status: DC | PRN
  Administered 2022-06-23: 180 mg via INTRAVENOUS

## 2022-06-23 MED ORDER — EZETIMIBE 10 MG PO TABS
10.0000 mg | ORAL_TABLET | Freq: Every evening | ORAL | Status: DC
  Administered 2022-06-23: 10 mg via ORAL
  Filled 2022-06-23: qty 1

## 2022-06-23 MED ORDER — ATORVASTATIN CALCIUM 40 MG PO TABS
40.0000 mg | ORAL_TABLET | Freq: Every evening | ORAL | Status: DC
  Administered 2022-06-23: 40 mg via ORAL
  Filled 2022-06-23: qty 1

## 2022-06-23 MED ORDER — INSULIN ASPART 100 UNIT/ML IJ SOLN
0.0000 [IU] | INTRAMUSCULAR | Status: DC
  Administered 2022-06-23: 3 [IU] via SUBCUTANEOUS
  Administered 2022-06-23: 2 [IU] via SUBCUTANEOUS

## 2022-06-23 MED ORDER — SACUBITRIL-VALSARTAN 97-103 MG PO TABS
1.0000 | ORAL_TABLET | Freq: Two times a day (BID) | ORAL | Status: DC
  Administered 2022-06-24: 1 via ORAL
  Filled 2022-06-23 (×2): qty 1

## 2022-06-23 MED ORDER — IPRATROPIUM-ALBUTEROL 0.5-2.5 (3) MG/3ML IN SOLN: 3.0000 mL | Freq: Four times a day (QID) | RESPIRATORY_TRACT | Status: DC | PRN

## 2022-06-23 MED ORDER — ONDANSETRON HCL 4 MG/2ML IJ SOLN
INTRAMUSCULAR | Status: DC | PRN
  Administered 2022-06-23: 4 mg via INTRAVENOUS

## 2022-06-23 MED ORDER — POTASSIUM CHLORIDE 10 MEQ/100ML IV SOLN
10.0000 meq | INTRAVENOUS | Status: AC
  Administered 2022-06-23 (×2): 10 meq via INTRAVENOUS
  Filled 2022-06-23 (×2): qty 100

## 2022-06-23 MED ORDER — SUCRALFATE 1 GM/10ML PO SUSP
1.0000 g | Freq: Three times a day (TID) | ORAL | Status: DC
  Administered 2022-06-23 – 2022-06-24 (×3): 1 g via ORAL
  Filled 2022-06-23 (×4): qty 10

## 2022-06-23 MED ORDER — PROPOFOL 10 MG/ML IV BOLUS
INTRAVENOUS | Status: DC | PRN
  Administered 2022-06-23: 30 mg via INTRAVENOUS
  Administered 2022-06-23: 160 mg via INTRAVENOUS

## 2022-06-23 MED ORDER — LACTATED RINGERS IV SOLN: INTRAVENOUS | Status: AC

## 2022-06-23 MED ORDER — SODIUM CHLORIDE 0.9 % IV SOLN: INTRAVENOUS | Status: DC

## 2022-06-23 MED ORDER — FENTANYL CITRATE (PF) 100 MCG/2ML IJ SOLN
INTRAMUSCULAR | Status: AC
  Filled 2022-06-23: qty 2

## 2022-06-23 MED ORDER — AMIODARONE HCL 200 MG PO TABS
200.0000 mg | ORAL_TABLET | Freq: Every morning | ORAL | Status: DC
  Administered 2022-06-24: 200 mg via ORAL
  Filled 2022-06-23: qty 1

## 2022-06-23 MED ORDER — DULOXETINE HCL 60 MG PO CPEP
60.0000 mg | ORAL_CAPSULE | Freq: Every day | ORAL | Status: DC
  Administered 2022-06-24: 60 mg via ORAL
  Filled 2022-06-23: qty 1

## 2022-06-23 MED ORDER — INSULIN ASPART 100 UNIT/ML IJ SOLN
0.0000 [IU] | Freq: Three times a day (TID) | INTRAMUSCULAR | Status: DC
  Administered 2022-06-24: 11 [IU] via SUBCUTANEOUS

## 2022-06-23 MED ORDER — MUPIROCIN 2 % EX OINT
1.0000 | TOPICAL_OINTMENT | Freq: Two times a day (BID) | CUTANEOUS | Status: DC
  Administered 2022-06-23 – 2022-06-24 (×3): 1 via NASAL
  Filled 2022-06-23 (×2): qty 22

## 2022-06-23 MED ORDER — PHENYLEPHRINE 80 MCG/ML (10ML) SYRINGE FOR IV PUSH (FOR BLOOD PRESSURE SUPPORT)
PREFILLED_SYRINGE | INTRAVENOUS | Status: DC | PRN
  Administered 2022-06-23 (×4): 80 ug via INTRAVENOUS

## 2022-06-23 MED ORDER — AMLODIPINE BESYLATE 5 MG PO TABS
5.0000 mg | ORAL_TABLET | Freq: Every evening | ORAL | Status: DC
  Administered 2022-06-23: 5 mg via ORAL
  Filled 2022-06-23: qty 1

## 2022-06-23 MED ORDER — DEXAMETHASONE SODIUM PHOSPHATE 10 MG/ML IJ SOLN
INTRAMUSCULAR | Status: DC | PRN
  Administered 2022-06-23: 5 mg via INTRAVENOUS

## 2022-06-23 MED ORDER — FENTANYL CITRATE (PF) 100 MCG/2ML IJ SOLN
INTRAMUSCULAR | Status: DC | PRN
  Administered 2022-06-23 (×2): 50 ug via INTRAVENOUS

## 2022-06-23 MED ORDER — INSULIN GLARGINE-YFGN 100 UNIT/ML ~~LOC~~ SOLN
30.0000 [IU] | Freq: Every day | SUBCUTANEOUS | Status: DC
  Administered 2022-06-23: 30 [IU] via SUBCUTANEOUS
  Filled 2022-06-23 (×3): qty 0.3

## 2022-06-23 MED ORDER — LACTATED RINGERS IV SOLN: INTRAVENOUS | Status: DC

## 2022-06-23 MED ORDER — PANTOPRAZOLE SODIUM 40 MG IV SOLR
40.0000 mg | Freq: Two times a day (BID) | INTRAVENOUS | Status: DC
  Administered 2022-06-23 – 2022-06-24 (×3): 40 mg via INTRAVENOUS
  Filled 2022-06-23 (×3): qty 10

## 2022-06-23 MED ORDER — IPRATROPIUM-ALBUTEROL 0.5-2.5 (3) MG/3ML IN SOLN: 3.0000 mL | Freq: Four times a day (QID) | RESPIRATORY_TRACT | Status: DC

## 2022-06-23 MED ORDER — CARVEDILOL 25 MG PO TABS
25.0000 mg | ORAL_TABLET | Freq: Two times a day (BID) | ORAL | Status: DC
  Administered 2022-06-24: 25 mg via ORAL
  Filled 2022-06-23: qty 1

## 2022-06-23 MED ORDER — IPRATROPIUM-ALBUTEROL 0.5-2.5 (3) MG/3ML IN SOLN
3.0000 mL | Freq: Four times a day (QID) | RESPIRATORY_TRACT | Status: DC
  Administered 2022-06-23: 3 mL via RESPIRATORY_TRACT
  Filled 2022-06-23: qty 3

## 2022-06-23 MED ORDER — METOCLOPRAMIDE HCL 5 MG/ML IJ SOLN
5.0000 mg | Freq: Once | INTRAMUSCULAR | Status: DC
  Filled 2022-06-23: qty 2

## 2022-06-23 MED ORDER — CHLORHEXIDINE GLUCONATE CLOTH 2 % EX PADS
6.0000 | MEDICATED_PAD | Freq: Every day | CUTANEOUS | Status: DC
  Administered 2022-06-24: 6 via TOPICAL

## 2022-06-23 MED ORDER — LIDOCAINE 2% (20 MG/ML) 5 ML SYRINGE
INTRAMUSCULAR | Status: DC | PRN
  Administered 2022-06-23: 80 mg via INTRAVENOUS

## 2022-06-23 SURGICAL SUPPLY — 15 items

## 2022-06-23 NOTE — Transfer of Care (Signed)
Immediate Anesthesia Transfer of Care Note  Patient: Terry Parks  Procedure(s) Performed: ESOPHAGOGASTRODUODENOSCOPY (EGD) WITH PROPOFOL BIOPSY HEMOSTASIS CLIP PLACEMENT  Patient Location: Endoscopy Unit  Anesthesia Type:General  Level of Consciousness: drowsy and patient cooperative  Airway & Oxygen Therapy: Patient Spontanous Breathing and Patient connected to face mask oxygen  Post-op Assessment: Report given to RN and Post -op Vital signs reviewed and stable  Post vital signs: Reviewed and stable  Last Vitals:  Vitals Value Taken Time  BP 139/98 06/23/22 1432  Temp    Pulse 90 06/23/22 1432  Resp 11 06/23/22 1432  SpO2 100 % 06/23/22 1432    Last Pain:  Vitals:   06/23/22 1320  TempSrc: Temporal  PainSc: 0-No pain         Complications: No notable events documented.

## 2022-06-23 NOTE — ED Notes (Signed)
Report given to [facility] reports ETA [id number].

## 2022-06-23 NOTE — H&P (Addendum)
Date: 06/23/2022               Patient Name:  Terry Parks MRN: [id number]  DOB: 09/22/1955 Age / Sex: 67 y.o., male   PCP: [name], MD         Medical Service: Internal Medicine Teaching Service         Attending Physician: Dr. Evette Doffing, Mallie Mussel, *      First Contact: Dr. [name], DO Pager [phone]    Second Contact: Dr. Buddy Duty, DO Pager [phone]         After Hours (After 5p/  First Contact Pager: [phone]  weekends / holidays): Second Contact Pager: [phone]   SUBJECTIVE   Chief Complaint: Blood in vomit  History of Present Illness:  Terry Parks is a 67 year old person with pertinent past medical history of severe esophagitis, HFrEF (EF 35-40%), T2DM, and paroxysmal atrial fibrillation on xarelto. He presented to [facility] ED in setting of 3 episodes of blood in vomit. He states that his stomach felt weird and he threw up 2 times early in the morning of 2/6.  The previous night, patient states he had a steak, and felt fine.  Patient denies any sick contacts.  He denies eating any raw or uncooked foods.  He thought that he may have eaten too much, and that is what may have brought on his vomiting.  He states that he tried to eat saltine crackers, and Pepsi, but was unable to keep it down.  He states his first episode of vomiting look like he had some bright red blood, and his second 2 episodes of vomiting occurred which she felt had some coffee-ground look to it.  Patient denies any dark or tarry stools.  He denies any recent NSAID use.  He denied any associated shortness of breath or chest pain.  He did state his last episode of vomiting was at 1700 yesterday.  On my exam, patient states that his nausea has improved, and has feeling in his stomach has improved as well.  Per chart review, he follows with Dr. Rush Landmark and had EGD 1/24 which showed grade D esophagitis with no bleeding and 3 cm hiatal hernia.   ED Course: Patient presented to the emergency room  with vital signs showing blood pressure 124/89, pulse 111, temperature 97.9 F, respiratory rate 14.  Initial labs showed lipase 205, CMP showed elevated creatinine at 1.94, CBC showed hemoglobin of 17, with negative fecal occult blood test.  CT abdomen pelvis showed a large ventral/umbilical hernia as well as chronic calcific pancreatitis but no acute concerns.  Chest x-ray negative for any acute cardiopulmonary processes.  EDP did consult GI, who recommended admitting to medicine.  Patient started on Protonix IV with fluids, and I MTS consulted for admission.  Past Medical History Past Medical History:  Diagnosis Date   Abscessed tooth    top back large cavity no pain or drainage, one on bottom  pt pulled tooth 4-5 months ago, right top large hole in tooth   AKI (acute kidney injury) ([place])    Allergic rhinitis    Anemia    Anxiety    Asthma    Atrial fibrillation (HCC)    BPH (benign prostatic hypertrophy)    Massive BPH noted on cystoscopy 1/23/ 2012 by Dr. Risa Grill.   Cancer [facility])    prostate cancer 2019   Cardiomyopathy [facility])    CHF (congestive heart failure) ([place])    Cough 03/30/2012   Depression  Diabetes mellitus 04/08/2008   type 2   Dyspnea    Dysrhythmia 2019   Foley catheter in place 07-05-17 placed   Fracture, orbital ([place]) 2021   Right   GERD (gastroesophageal reflux disease) 2020   Headache(784.0)    hx migraines none recent   History of esophagitis 12/16/2019   Hyperlipemia    Hypertension    Hypertensive cardiopathy 03/01/2006   2-D echocardiogram 02/01/2012 showed moderate LVH, mildly to moderately reduced left ventricular systolic function with an estimated ejection fraction of 40-45%, and diffuse hypokinesis.  A nuclear medicine stress study done 01/31/2012 showed no reversible ischemia, a small mid anterior wall fixed defect/infarct, and ejection fraction 42%.       Neck pain    Nephrolithiasis 05/29/2010   CT scan of abdomen/pelvis on 05/29/2010 showed an  obstructing approximate 1-2 mm calculus at the left UVJ, and an approximate 1-2 mm left lower pole renal calculus.   Patient had continuing severe pain , and an elevation of his serum creatinine to a value of 1.75 on 06/06/2010.  Patient underwent cystoscopy on 06/08/2010 by Dr. Risa Grill, but attempts at retrograde pyelogram and ureteroscopy were unsucc   Numbness 01/08/2018   Obstructive sleep apnea 03/06/2008   Sleep study 03/06/08 showed severe OSA/hypopnea syndrome, with successful CPAP titration to 13 CWP using a medium ResMed Mirage Quattro full face mask with heated humidifier.    Rash 04/17/2014   Renal calculus 05/29/2010   CT scan of abdomen/pelvis on 05/29/2010 showed an obstructing approximate 1-2 mm calculus at the left UVJ, and an approximate 1-2 mm left lower pole renal calculus.   Patient had continuing severe pain , and an elevation of his serum creatinine to a value of 1.75 on 06/06/2010.  The stone had apparently passed and was not seen on repeat CT 06/08/2010.   Sleep apnea    haven't use cpap in 2 years   Tooth pain 10/29/2017   Urinary straining 11/02/2016     Meds:  Amiodarone 200 mg daily Amlodipine 5 mg nightly Atorvastatin 40 mg daily Carvedilol 25 mg twice daily Duloxetine 60 mg daily Empagliflozin 25 mg daily Ezetimibe 10 mg nightly Lantus 52 units daily Combivent 1 puff every 6 hours, has not needed recently Entresto 97-103 mg twice daily Ozempic 2 mg weekly Spironolactone 50 mg daily Torsemide 20 mg 2 tablets daily No outpatient medications have been marked as taking for the 06/22/22 encounter [facility] Encounter).    Past Surgical History  Past Surgical History:  Procedure Laterality Date   big toe nails removed Bilateral 20 yrs ago   CARDIOVERSION N/A 06/08/2017   Procedure: CARDIOVERSION;  Surgeon: Josue Hector, MD;  Location: [facility] ENDOSCOPY;  Service: Cardiovascular;  Laterality: N/A;   COLONOSCOPY     CYSTOSCOPY W/ RETROGRADES     FRACTURE SURGERY Right 2021    5th right phalange   INCISIONAL HERNIA REPAIR N/A 03/24/2020   Procedure: LAPAROSCOPIC INCISIONAL HERNIA REPAR WITH MESH;  Surgeon: Georganna Skeans, MD;  Location: [place];  Service: General;  Laterality: N/A;   IR RADIOLOGIST EVAL & MGMT  02/02/2018   LYMPHADENECTOMY Bilateral 10/06/2017   Procedure: LYMPHADENECTOMY;  Surgeon: Lucas Mallow, MD;  Location: [facility] ORS;  Service: Urology;  Laterality: Bilateral;   ROBOT ASSISTED LAPAROSCOPIC RADICAL PROSTATECTOMY N/A 10/06/2017   Procedure: XI ROBOTIC ASSISTED LAPAROSCOPIC RADICAL PROSTATECTOMY;  Surgeon: Lucas Mallow, MD;  Location: [facility] ORS;  Service: Urology;  Laterality: N/A;   TRANSURETHRAL RESECTION OF BLADDER TUMOR N/A  07/13/2017   Procedure: TRANSURETHRAL RESECTION OF PROSTATE;  Surgeon: Lucas Mallow, MD;  Location: [facility] ORS;  Service: Urology;  Laterality: N/A;    Social:  Lives With: Self Occupation: N/A Support: Makes own decisions  Level of Function: independent in ADLs and iADLs PCP: [facility]- Dr. Lisabeth Devoid Substances: denies EtOH use, tobacco use  Family History: No pertinent family history  Allergies: Allergies as of 06/22/2022 - Review Complete 06/22/2022  Allergen Reaction Noted   Lisinopril Cough 03/30/2012    Review of Systems: A complete ROS was negative except as per HPI.   OBJECTIVE:   Physical Exam: Blood pressure (!) 114/91, pulse (!) 105, temperature 98.4 F (36.9 C), temperature source Oral, resp. rate 18, height '6\' 1"'$  (1.854 m), weight 124.7 kg, SpO2 98 %.  Constitutional: Well-appearing, resting bed, no acute distress HENT: normocephalic atraumatic, mucous membranes moist, no signs of blood in oropharynx Eyes: conjunctiva non-erythematous Cardiovascular: regular rate and rhythm, no m/r/g Pulmonary/Chest: normal work of breathing on room air, lungs clear to auscultation bilaterally Abdominal: Soft, nondistended, there is umbilical hernia noted on exam.  Nontender. MSK: normal bulk and  tone  Labs: CBC    Component Value Date/Time   WBC 8.8 06/22/2022 1902   RBC 6.13 (H) 06/22/2022 1902   HGB 17.0 06/22/2022 1902   HGB 13.0 11/10/2018 1123   HCT 50.8 06/22/2022 1902   HCT 39.9 11/10/2018 1123   PLT 192 06/22/2022 1902   PLT 207 11/10/2018 1123   MCV 82.9 06/22/2022 1902   MCV 85 11/10/2018 1123   MCH 27.7 06/22/2022 1902   MCHC 33.5 06/22/2022 1902   RDW 13.0 06/22/2022 1902   RDW 14.6 11/10/2018 1123   LYMPHSABS 2.0 04/23/2019 1534   LYMPHSABS 1.4 02/08/2018 1054   MONOABS 0.7 04/23/2019 1534   EOSABS 0.1 04/23/2019 1534   EOSABS 0.1 02/08/2018 1054   BASOSABS 0.0 04/23/2019 1534   BASOSABS 0.0 02/08/2018 1054     CMP     Component Value Date/Time   NA 135 06/22/2022 1902   NA 140 04/27/2022 1021   K 4.0 06/22/2022 1902   CL 95 (L) 06/22/2022 1902   CO2 26 06/22/2022 1902   GLUCOSE 215 (H) 06/22/2022 1902   BUN 23 06/22/2022 1902   BUN 9 04/27/2022 1021   CREATININE 1.94 (H) 06/22/2022 1902   CREATININE 1.08 07/18/2014 1056   CALCIUM 9.7 06/22/2022 1902   PROT 8.4 (H) 06/22/2022 1902   PROT 7.5 09/30/2021 1100   ALBUMIN 4.3 06/22/2022 1902   ALBUMIN 4.1 09/30/2021 1100   AST 13 (L) 06/22/2022 1902   ALT 12 06/22/2022 1902   ALKPHOS 93 06/22/2022 1902   BILITOT 0.8 06/22/2022 1902   BILITOT 0.5 09/30/2021 1100   GFRNONAA 38 (L) 06/22/2022 1902   GFRNONAA 76 07/18/2014 1056   GFRAA 66 04/01/2020 1138   GFRAA 88 07/18/2014 1056    Imaging:  CTA/P- IMPRESSION: 1. No acute noncontrast CT findings. 2. Constipation and diverticulosis. 3. Large ventral/umbilical hernia containing multiple nonobstructed small bowel loops, in the setting of failed ventral mesh hernia repair. Small hiatal hernia. 4. Aortic atherosclerosis. 5. Chronic calcific pancreatitis. 6. Chronic bladder thickening which could be due to nondistention, cystitis or chronic outlet obstruction. Prior TURP. 7. Small left inguinal fat hernia. 8. L5-S1 disc bulge mildly  compresses both S1 nerve roots  CXR- IMPRESSION: No active disease.  EKG: personally reviewed my interpretation is sinus tachycardia with Right Axis Deviation, prolonged QTc at 482. Prior EKG with  sinus rhythm with prolonged QTc of 493.  ASSESSMENT & PLAN:   Assessment & Plan by Problem: Principal Problem:   Hematemesis Active Problems:   Upper GI bleed   Terry Parks is a 67 y.o. male with a past medical history of severe esophagitis, HFrEF (EF 35-40%), T2DM, and paroxysmal atrial fibrillation on xarelto, who presents with concerns of hematemesis.  Patient mated for further evaluation and management of suspected GI bleed.  #Concern for Upper GI bleed #Grade D esophagitis Patient presents with concerns of hematemesis.  Patient noticed some coffee-ground emesis.  Patient does have a history of grade D esophagitis.  Most recent EGD on 06/09/2021 showing Grade D erosive esophagitis with no bleeding.  Patient was recommended Nexium 40 mg twice daily, but patient states he uses an over-the-counter antacid.  On exam, patient does not have pale conjunctiva.  Patient denies any abdominal tenderness.  Hemoglobin seems stable at 17.  Patient is on Xarelto, and could be contributing.  Patient denies any NSAID use or any tarry stools.  Patient did have negative fecal occult blood test.  Given stable hemoglobin, not concered about a very brisk bleed but patient could benefit from a repeat EGD. Currently, patient is not nauseated or having any more vomiting.  Will continue to monitor.  GI is to see patient in the a.m. -Keep patient n.p.o. -Monitor CBC -GI to see in the a.m., appreciate recs  -Supportive care -Monitor signs for further hematemesis -Start Protonix 40 mg IV twice daily  #AKI on CKD stage 3B Creatinine 1.94 with BUN 23 abd GFR 38. Baseline creatinine of 1.5. He received 2L fluid in ED. The CT showed chronic bladder thickening with evidence of prior TURP. Post-void residual of 33 mL.   Will start some IV fluids and monitor BMP. -LR 100cc/ hr for 10 hrs -strict I and Os -Monitor BMP  #Type 2 diabetes mellitus Home medications include Glargine 52 qd, Semaglutide 2 mg, and Empagliflozin 25 mg. Last HgbA1c at 12.7% 12/23.  Will continue to monitor blood sugars during hospitalization.  Given patient will be n.p.o. currently, will start at a decrease dose of long-acting. -Semglee 30 units nightly while patient n.p.o. -Hold home empagliflozin 25 mg while NPO -Sliding scale insulin every 4 hours -Continue monitor CBGs  #Chronic pancreatitis Patient's lipase was 205 today.  CT scan findings showing concern for chronic calcific pancreatitis. Etiology is unclear currently, but can be due to hypertriglyceridemia, and in some cases related to diabetes mellitus and chronic renal failure.  Patient denies drinking or smoking, so alcoholic or smoking-related etiology less likely.  Could also be related to autoimmune disease.  Could also be idiopathic.  Currently, patient does not seem to be in acute pancreatitis, and is not requiring treatment for acute pain.  Patient's most recent lipid panel on 09/30/2021 showing triglycerides of 114, so less likely contributing.  Patient does have CKD stage IIIb, as well as diabetes that has been uncontrolled with most recent A1c at 12.7, and could be contributing to chronic pancreatitis.  Patient could also benefit from autoimmune workup for autoimmune pancreatitis. -Monitor for worsening signs of abdominal pain   #HFrEF with EF 35-40% Patient's most recent echo and 02/23/2019 showing ejection fraction of 35 to 40%. Last weight in clinic 12/23 at 129.4 kg and he now weighs 124.7 kg. Medications include coreg 25 mg BID, entresto 97-103 mg, spironolactone 50 mg, torsemide 40 mg qd, and empagliflozin 25 mg daily.  No concern for acute exacerbation. -While patient is n.p.o.,  will hold home medication -After decision made for EGD or not, can resume home GDMT once  patient is scoped or not  -Strict I's and O's -Daily weights -strict I and Os -daily weights   #Ventral Hernia Patient does have a ventral hernia, found on CT scanning as well.  No acute concerns for strangulation at the moment. -Continue to monitor  #Paroxysmal atrial fibrillation Patient does have history of paroxysmal atrial fibrillation.  Patient to Xarelto 15 mg daily and amiodarone 200 mg daily.  Patient reports compliance.  EKG today shows patient sinus tachycardia with rate of 103. -Hold Xarelto -Hold home amiodarone 200 mg daily -Can resume once patient has been scoped or if they choose not to scope  #HTN Patient with past medical history of hypertension.  Patient's blood pressure currently at 126/60.  Blood pressure measuring well.  Home medication includes amlodipine 5 mg. -Hold home amlodipine 5 mg daily -Monitor blood pressure  #HLD Patient has a past medical history of hyperlipidemia.  Patient takes ezetimibe 10 mg and atorvastatin 40 mg daily. -Hold home ezetimibe and atorvastatin while patient is n.p.o. -Can resume medications once decision is made to scope or not  Diet: NPO VTE: SCDs IVF: LR,100cc/hr Code: Full  Prior to Admission Living Arrangement: Home, living by self Anticipated Discharge Location: Home Barriers to Discharge: GI evaluation and possible EGD  Dispo: Admit patient to Observation with expected length of stay less than 2 midnights.  Signed: Leigh Aurora, DO Internal Medicine Resident PGY-1 Pager: [phone] 06/23/2022, 4:22 AM   On weekends or after 5pm please page on call intern or resident: First contact: [phone] If no answer in 15 minutes, please contact senior pager at [phone]

## 2022-06-23 NOTE — Progress Notes (Signed)
HD#0 Subjective:   Summary: Terry Parks is a 67 y.o. male with PMH of severe esophagitis, HFrEF (35-40%), T2DM, and paroxysmal Afib on xarelto who presents with hematemesis and admitted for concern upper GI bleed.  Overnight Events: none  Patient states no further hematemesis. No nausea. States he is hungry but NPO for procedure.   Objective:  Vital signs in last 24 hours: Vitals:   06/23/22 0000 06/23/22 0056 06/23/22 0106 06/23/22 0418  BP: 130/83 (!) 114/91  126/60  Pulse: 99 (!) 105  98  Resp: '18 18  18  '$ Temp:  98.4 F (36.9 C)  98.4 F (36.9 C)  TempSrc:  Oral  Oral  SpO2: 95% 98%  98%  Weight:   124.7 kg   Height:   '6\' 1"'$  (1.854 m)    Supplemental O2: Room Air SpO2: 98 %   Physical Exam:  Constitutional: well-appearing male lying in bed, in no acute distress HENT: normocephalic atraumatic Cardiovascular: regular rate and rhythm Pulmonary/Chest: normal work of breathing on room air, anterior lung sounds clear to auscultation  Abdominal: soft, non-tender, non-distended MSK: normal bulk and tone Neurological: alert & oriented x 3 Skin: warm and dry Psych: pleasant mood  Filed Weights   06/22/22 1851 06/23/22 0106  Weight: 129.4 kg 124.7 kg     Intake/Output Summary (Last 24 hours) at 06/23/2022 0706 Last data filed at 06/23/2022 2482 Gross per 24 hour  Intake 2437.4 ml  Output 500 ml  Net 1937.4 ml   Net IO Since Admission: 1,937.4 mL [06/23/22 0706]  Pertinent Labs:    Latest Ref Rng & Units 06/23/2022    4:51 AM 06/22/2022    7:02 PM 05/20/2021    4:12 PM  CBC  WBC 4.0 - 10.5 K/uL 7.3  8.8  7.1   Hemoglobin 13.0 - 17.0 g/dL 15.0  17.0  14.9   Hematocrit 39.0 - 52.0 % 45.9  50.8  46.0   Platelets 150 - 400 K/uL 149  192  185.0        Latest Ref Rng & Units 06/23/2022    4:51 AM 06/22/2022    7:02 PM 04/27/2022   10:21 AM  CMP  Glucose 70 - 99 mg/dL 148  215  285   BUN 8 - 23 mg/dL '19  23  9   '$ Creatinine 0.61 - 1.24 mg/dL 1.81  1.94  1.24    Sodium 135 - 145 mmol/L 137  135  140   Potassium 3.5 - 5.1 mmol/L 3.4  4.0  4.3   Chloride 98 - 111 mmol/L 98  95  105   CO2 22 - 32 mmol/L '27  26  20   '$ Calcium 8.9 - 10.3 mg/dL 8.6  9.7  8.4   Total Protein 6.5 - 8.1 g/dL  8.4    Total Bilirubin 0.3 - 1.2 mg/dL  0.8    Alkaline Phos 38 - 126 U/L  93    AST 15 - 41 U/L  13    ALT 0 - 44 U/L  12      Imaging: CT ABDOMEN PELVIS WO CONTRAST  Result Date: 06/22/2022 CLINICAL DATA:  Acute abdominal pain and vomiting. Known ventral hernia. Occasional hematemesis. EXAM: CT ABDOMEN AND PELVIS WITHOUT CONTRAST TECHNIQUE: Multidetector CT imaging of the abdomen and pelvis was performed following the standard protocol without IV contrast. RADIATION DOSE REDUCTION: This exam was performed according to the departmental dose-optimization program which includes automated exposure control, adjustment of the mA and/or  kV according to patient size and/or use of iterative reconstruction technique. COMPARISON:  CT with IV and oral contrast 05/27/2021, CT with IV contrast 02/02/2018. FINDINGS: Lower chest: The cardiac size is normal. There is a small hiatal hernia. Lung bases are clear. Hepatobiliary: The liver 16 cm length, mildly steatotic. No focal abnormality is seen without contrast. The gallbladder mildly distended but without wall thickening, calcified stones or biliary dilatation. Pancreas: Partially fatty atrophic, especially in the head, neck and uncinate process. There are a few calcifications in the pancreatic head consistent with chronic calcific pancreatitis. No focal abnormality is seen without contrast, no inflammatory changes or ductal dilatation. Spleen: Unremarkable without contrast. No splenomegaly. Small splenule noted inferomedially. Adrenals/Urinary Tract: There is no adrenal mass. There is bilateral fetal lobation of the kidneys without contour deforming mass identifiable without contrast. There is no urinary stone or obstruction. The bladder is  contracted and not well seen but could be mildly thickened although is similar in appearance to the previous studies. Stomach/Bowel: Previous attempted ventral mesh hernia repair with recurrent broad-based wide mouth large ventral/umbilical hernia with mesh failure superolaterally, is again noted. There are multiple nonobstructed small bowel loops contained in the large hernia sac, with rectus diastasis. No small bowel obstruction or inflammation is seen. The gastric wall is unremarkable. The appendix extends medially from the cecum and is normal caliber. There is mild-to-moderate fecal stasis. There is diverticulosis without evidence of diverticulitis. Vascular/Lymphatic: Aortic atherosclerosis. No enlarged abdominal or pelvic lymph nodes. Reproductive: Prior TURP.  No recurrent prostatomegaly. Other: There is no free air, free hemorrhage, free fluid or incarcerated hernia. Small left inguinal fat hernia. Large ventral hernia described above. There are no acute inflammatory changes. Musculoskeletal: Mild degenerative change lumbar spine. Bilateral bridging osteophytes of the SI joints. No acute or new osseous findings. Mild hip DJD. Once again, L5-S1 disc bulge mildly compresses both S1 nerve roots. IMPRESSION: 1. No acute noncontrast CT findings. 2. Constipation and diverticulosis. 3. Large ventral/umbilical hernia containing multiple nonobstructed small bowel loops, in the setting of failed ventral mesh hernia repair. Small hiatal hernia. 4. Aortic atherosclerosis. 5. Chronic calcific pancreatitis. 6. Chronic bladder thickening which could be due to nondistention, cystitis or chronic outlet obstruction. Prior TURP. 7. Small left inguinal fat hernia. 8. L5-S1 disc bulge mildly compresses both S1 nerve roots. Aortic Atherosclerosis (ICD10-I70.0). Electronically Signed   By: Telford Nab M.D.   On: 06/22/2022 21:19   DG Chest Portable 1 View  Result Date: 06/22/2022 CLINICAL DATA:  Hematemesis EXAM: PORTABLE  CHEST 1 VIEW COMPARISON:  08/10/2019 FINDINGS: Heart size upper limits of normal. Mediastinal shadows are normal. The lungs are clear. No edema. No infiltrate, collapse or effusion. No abnormal bone finding. IMPRESSION: No active disease. Electronically Signed   By: Nelson Chimes M.D.   On: 06/22/2022 21:04    Assessment/Plan:   Principal Problem:   Hematemesis Active Problems:   Upper GI bleed   Patient Summary: Terry Parks is a 67 y.o. with a PMH of severe esophagitis, HFrEF (35-40%), T2DM, and paroxysmal Afib on xarelto who presents with hematemesis and admitted for concern upper GI bleed.   Concern for Upper GI bleed Grade D esophagitis  Presented with 3 episodes of hematemesis at home. No further episodes. Hemoglobin 17>15. GI consulted, EGD today showed Grade D reflux esophagitis with ulcer with stigmata of recent bleeding and clip placed and mild non-ulcer gastritis, pending biopsy. Full liquid diet today then advance to soft tomorrow.  -appreciate GI assistance -full liquid  diet then advance to soft tomorrow -f/u on GI recommendations -Protonix 40 mg IV BID -monitor CBC and for further hematemesis  AKI on CKD 3b Creatinine improved 1.81 after IV fluids. Baseline around 1.5.  -monitor renal function   T2DM Last A1c 12.7 in December. NPO this morning. Home meds include glargine 52 units, semaglutide 2 mg, and empagliflozin 25 mg. Will start long acting insulin at lower dose since NPO and now only full liquid diet.  -hold jardiance and semaglutide for now -continue semglee 30 units qhs  -continue SSI   Chronic pancreatitis  Lipase 205. No abdominal tenderness or pain on exam today. CT showed chronic calcific pancreatitis. No acute concerns. Can continue outpatient follow up.   HFrEF HTN HLD Does not appear hypervolemic today. Normotensive. Will restart home meds after procedure.  -strict I&Os -restart amlodipine 5 mg -restart atorvastatin 40 mg and zetia 10  mg -restart carvedilol 25 mg BID and Entresto BID for tomorrow  Paroxysmal Afib  Holding Xarelto. Last dose 2/5. Last EKG was sinus tachycardia. Home med also include amiodarone. Patient may benefit with switching anticoagulation to Eliquis which has slightly lower GI bleed risk.  -restart home amiodarone 200 mg  -discuss with patient about switching to Eliquis  -per GI, can restart home anticoagulation in 3 days  Diet:  Full liquid (will advance to soft tomorrow)   IVF: None,None VTE: None Code: Full  Dispo: Anticipated discharge to Home pending medical stability.   [name], DO Internal Medicine Resident PGY-1 Pager: [phone] Please contact the on call pager after 5 pm and on weekends at [phone].

## 2022-06-23 NOTE — ED Notes (Signed)
Leaving with [facility] via stretcher at this time.

## 2022-06-23 NOTE — H&P (View-Only) (Signed)
Attending physician's note   I have taken a history, reviewed the chart, and examined the patient. I performed a substantive portion of this encounter, including complete performance of at least one of the key components, in conjunction with the APP. I agree with the APP's note, impression, and recommendations with my edits.   67 year old male with medical history as outlined below, to include history of A-fib (on Xarelto), HTN, HLD, OSA, diabetes, CHF (EF 35-40%), prostate CA, GERD with erosive esophagitis (EGD 05/2021 with LA Grade D erosive esophagitis, 3 cm HH), presenting with hematemesis.  No previous history of GI bleed.  Reports that he was overeating yesterday then sudden onset nausea/vomiting with first episode of vomiting containing bright red blood, then coffee-ground emesis on the last time he vomited.  Longstanding history of reflux, but he states that has been otherwise well-controlled with Nexium 40 mg daily and takes an additional OTC antacid on demand.  Hgb down 2 g since arrival, but otherwise HD stable.  1) Hematemesis - Plan for expedited EGD today for diagnostic and therapeutic intent - Started on high-dose IV PPI - Serial CBC checks - Was prescribed IV Reglan prior to transport to endoscopy unit, but he declined on the floor  2) GERD with erosive esophagitis 3) Hiatal hernia - Evaluate for erosive esophagitis at time of EGD as above - PPI as above  4) A-fib 5) Chronic anticoagulation 6) CHF with reduced EF - Holding Xarelto.  Last dose was 06/21/2022 - Management per primary Hospital service  7) Diabetes - Last dose of Ozempic was 2 days ago.  Discussed with Anesthesia staff.  Will elect for intubation due to elevated risk for retained gastric contents  The indications, risks, and benefits of EGD were explained to the patient in detail. Risks include but are not limited to bleeding, perforation, adverse reaction to medications, and cardiopulmonary compromise.  Sequelae include but are not limited to the possibility of surgery, hospitalization, and mortality. The patient verbalized understanding and wished to proceed.  [address] DO, [place] [phone] office          Consultation  Referring Provider:   [facility] Primary Care Physician:  [name], MD Primary Gastroenterologist:  Dr. Rush Landmark       Reason for Consultation:   Hematemesis   Impression    Hematemesis 2 g drop of hemoglobin 17-15 Negative FOBT On Xarelto, last dose 06/21/2022 EGD 06/09/2021 with grade D esophagitis, 3 cm hiatal hernia  A-fib on Xarelto Last dose 06/21/2022  Elevated lipase Unremarkable CT, likely from emesis  Chronic combined systolic and diastolic heart failure 76/8088 echo EF 35 to 11% impaired diastolic filling no AAS. Appears euvolemic  Principal Problem:   Hematemesis Active Problems:   Upper GI bleed    LOS: 0 days     Plan   - Protonix 40 mg IV BID. --Continue to monitor H&H with transfusion as needed to maintain hemoglobin greater than 7. - NPO  except gave patient ice chips this AM only -Plan for EGD today. I thoroughly discussed the procedure to include nature, alternatives, benefits, and risks including but not limited to bleeding, perforation, infection, anesthesia/cardiac and pulmonary complications.  Patient provides understanding and gave verbal consent to proceed. -Will give 5 mg IV Reglan minutes prior to endoscopy - recall colon 2030  Thank you for your kind consultation, we will continue to follow.         HPI:   Terry Parks is a 68 y.o. male  with past medical history significant for Afib on xarelto, hypertension, hyperlipidemia, OSA, diabetes, HFrEF, prostate cancer status post prostatectomy, GERD with grade D esophagitis, hemorrhoids, prior IDA presents with hematemesis  05/20/2021 office visit outpatient with Dr. Rush Landmark for epigastric abdominal pain 06/09/2021 EGD Dr. Rush Landmark for IDA, coffee-ground  emesis, epigastric pain showed LA grade D esophagitis, 3 cm hiatal hernia, normal stomach and duodenum.  Pathology showed acute erosive esophagitis negative for fungal organisms, viral, dysplasia.  This was have repeat endoscopy 4 months however this was never done. So to be on Nexium twice daily and Carafate.  Patient presented to [place] ED yesterday after 3 episodes of hematemesis. First episode of blood bright red, second episode coffee-ground emesis. Denies melena or hematochezia. Denies NSAIDs. Denies chest pain or shortness of breath.  In the ER patient had tachycardia, otherwise hemodynamically stable. Lipase 205 Creatinine 1.94 Hemoglobin 17, repeat hemoglobin this morning 15, no leukocytosis Negative Hemoccult blood test CT abdomen pelvis in the ER showed large ventral/umbilical hernia, chronic calcific pancreatitis but no acute abdomen Chest x-ray unremarkable   No family was present at the time of my evaluation.  Abnormal ED labs: Abnormal Labs Reviewed  LIPASE, BLOOD - Abnormal; Notable for the following components:      Result Value   Lipase 205 (*)    All other components within normal limits  COMPREHENSIVE METABOLIC PANEL - Abnormal; Notable for the following components:   Chloride 95 (*)    Glucose, Bld 215 (*)    Creatinine, Ser 1.94 (*)    Total Protein 8.4 (*)    AST 13 (*)    GFR, Estimated 38 (*)    All other components within normal limits  CBC - Abnormal; Notable for the following components:   RBC 6.13 (*)    All other components within normal limits  CBC - Abnormal; Notable for the following components:   Platelets 149 (*)    All other components within normal limits  BASIC METABOLIC PANEL - Abnormal; Notable for the following components:   Potassium 3.4 (*)    Glucose, Bld 148 (*)    Creatinine, Ser 1.81 (*)    Calcium 8.6 (*)    GFR, Estimated 41 (*)    All other components within normal limits  GLUCOSE, CAPILLARY - Abnormal; Notable for  the following components:   Glucose-Capillary 147 (*)    All other components within normal limits  CBG MONITORING, ED - Abnormal; Notable for the following components:   Glucose-Capillary 227 (*)    All other components within normal limits     Past Medical History:  Diagnosis Date   Abscessed tooth    top back large cavity no pain or drainage, one on bottom  pt pulled tooth 4-5 months ago, right top large hole in tooth   AKI (acute kidney injury) (HCC)    Allergic rhinitis    Anemia    Anxiety    Asthma    Atrial fibrillation (HCC)    BPH (benign prostatic hypertrophy)    Massive BPH noted on cystoscopy 1/23/ 2012 by Dr. Risa Grill.   Cancer [facility])    prostate cancer 2019   Cardiomyopathy [facility])    CHF (congestive heart failure) ([place])    Cough 03/30/2012   Depression    Diabetes mellitus 04/08/2008   type 2   Dyspnea    Dysrhythmia 2019   Foley catheter in place 07-05-17 placed   Fracture, orbital ([place]) 2021   Right   GERD (gastroesophageal reflux  disease) 2020   Headache(784.0)    hx migraines none recent   History of esophagitis 12/16/2019   Hyperlipemia    Hypertension    Hypertensive cardiopathy 03/01/2006   2-D echocardiogram 02/01/2012 showed moderate LVH, mildly to moderately reduced left ventricular systolic function with an estimated ejection fraction of 40-45%, and diffuse hypokinesis.  A nuclear medicine stress study done 01/31/2012 showed no reversible ischemia, a small mid anterior wall fixed defect/infarct, and ejection fraction 42%.       Neck pain    Nephrolithiasis 05/29/2010   CT scan of abdomen/pelvis on 05/29/2010 showed an obstructing approximate 1-2 mm calculus at the left UVJ, and an approximate 1-2 mm left lower pole renal calculus.   Patient had continuing severe pain , and an elevation of his serum creatinine to a value of 1.75 on 06/06/2010.  Patient underwent cystoscopy on 06/08/2010 by Dr. Risa Grill, but attempts at retrograde pyelogram and ureteroscopy were  unsucc   Numbness 01/08/2018   Obstructive sleep apnea 03/06/2008   Sleep study 03/06/08 showed severe OSA/hypopnea syndrome, with successful CPAP titration to 13 CWP using a medium ResMed Mirage Quattro full face mask with heated humidifier.    Rash 04/17/2014   Renal calculus 05/29/2010   CT scan of abdomen/pelvis on 05/29/2010 showed an obstructing approximate 1-2 mm calculus at the left UVJ, and an approximate 1-2 mm left lower pole renal calculus.   Patient had continuing severe pain , and an elevation of his serum creatinine to a value of 1.75 on 06/06/2010.  The stone had apparently passed and was not seen on repeat CT 06/08/2010.   Sleep apnea    haven't use cpap in 2 years   Tooth pain 10/29/2017   Urinary straining 11/02/2016    Surgical History:  He  has a past surgical history that includes Cystoscopy w/ retrogrades; Cardioversion (N/A, 06/08/2017); big toe nails removed (Bilateral, 20 yrs ago); Transurethral resection of bladder tumor (N/A, 07/13/2017); Robot assisted laparoscopic radical prostatectomy (N/A, 10/06/2017); Lymphadenectomy (Bilateral, 10/06/2017); IR Radiologist Eval & Mgmt (02/02/2018); Colonoscopy; Fracture surgery (Right, 2021); and Incisional hernia repair (N/A, 03/24/2020). Family History:  His family history includes Breast cancer in his mother; Diabetes in his maternal grandmother and mother; Hypertension in his father; Stroke in his father; Ulcerative colitis in his mother. Social History:   reports that he has never smoked. He has never used smokeless tobacco. He reports that he does not drink alcohol and does not use drugs.  Prior to Admission medications   Medication Sig Start Date End Date Taking? Authorizing Provider  Accu-Chek Softclix Lancets lancets Check blood sugar three times a day as instructed 05/13/22   Lacinda Axon, MD  amiodarone (PACERONE) 200 MG tablet TAKE ONE TABLET BY MOUTH EVERY MORNING 11/02/21   [name], [name], MD  amLODipine (NORVASC) 5 MG  tablet TAKE ONE TABLET BY MOUTH EVERY EVENING 06/22/21   [name], [name], MD  amoxicillin (AMOXIL) 500 MG capsule Take 1 capsule (500 mg total) by mouth every 6 (six) hours until gone. 10/01/21     atorvastatin (LIPITOR) 40 MG tablet TAKE ONE TABLET BY MOUTH EVERY EVENING 06/22/21   [name], [name], MD  Blood Glucose Monitoring Suppl (ACCU-CHEK GUIDE) w/Device KIT 1 Units by Does not apply route 3 (three) times daily. 11/25/20   [name], MD  Blood Glucose Monitoring Suppl (BLOOD GLUCOSE MONITOR SYSTEM) w/Device KIT Use up to 4 (four) times daily as directed 10/16/21   [name], MD  carvedilol (COREG) 25 MG  tablet TAKE ONE TABLET BY MOUTH EVERY MORNING and TAKE ONE TABLET BY MOUTH EVERY EVENING 07/13/21   [name], [name], MD  COMFORT EZ PEN NEEDLES 31G X 6 MM MISC USE AS DIRECTED AT BEDTIME 06/23/21   [name], MD  COMFORT EZ PEN NEEDLES 32G X 6 MM MISC  05/25/21   [provider]  DULoxetine (CYMBALTA) 60 MG capsule Take 1 capsule (60 mg total) by mouth daily. 11/02/21 10/28/22  [name], MD  empagliflozin (JARDIANCE) 25 MG TABS tablet TAKE 1 TABLET (25 MG TOTAL) BY MOUTH DAILY BEFORE BREAKFAST. 11/02/21 11/02/22  [name], MD  esomeprazole (NEXIUM) 40 MG capsule Take 1 capsule (40 mg total) by mouth 2 (two) times daily before a meal. 06/09/21   Mansouraty, [name], MD  ezetimibe (ZETIA) 10 MG tablet TAKE ONE TABLET BY MOUTH EVERY EVENING 05/13/22   [name], [name], MD  glucose blood (ACCU-CHEK GUIDE) test strip USE TO check blood glucose THREE TIMES DAILY AS DIRECTED 05/13/22   Lacinda Axon, MD  insulin glargine (LANTUS SOLOSTAR) 100 UNIT/ML Solostar Pen Inject 52 Units into the skin daily. 05/13/22 05/08/23  Lacinda Axon, MD  Ipratropium-Albuterol (COMBIVENT RESPIMAT) 20-100 MCG/ACT AERS respimat Inhale 1 puff into the lungs every 6 (six) hours. 10/16/21   [name], MD  rivaroxaban (XARELTO) 15 MG TABS tablet Take 1 tablet (15 mg total) by mouth  daily with breakfast. 10/06/21   [name], [name], MD  sacubitril-valsartan (ENTRESTO) 97-103 MG TAKE ONE TABLET BY MOUTH EVERY MORNING and TAKE ONE TABLET BY MOUTH EVERY EVENING 07/13/21   [name], [name], MD  Semaglutide, 2 MG/DOSE, (OZEMPIC, 2 MG/DOSE,) 8 MG/3ML SOPN Inject 2 mg into the skin once a week. 04/20/22   [name], MD  spironolactone (ALDACTONE) 50 MG tablet TAKE ONE TABLET BY MOUTH EVERY MORNING 06/22/21   [name], [name], MD  torsemide (DEMADEX) 20 MG tablet TAKE TWO TABLETS BY MOUTH EVERY MORNING 04/20/22   [name], [name], MD    Current Facility-Administered Medications  Medication Dose Route Frequency Provider Last Rate Last Admin   insulin aspart (novoLOG) injection 0-15 Units  0-15 Units Subcutaneous Q4H Masters, Katie, DO   2 Units at 06/23/22 0447   insulin glargine-yfgn [facility] injection 30 Units  30 Units Subcutaneous QHS Masters, Katie, DO       lactated ringers infusion   Intravenous Continuous Masters, [place], DO 100 mL/hr at 06/23/22 0158 New Bag at 06/23/22 0158   pantoprazole (PROTONIX) injection 40 mg  40 mg Intravenous Q12H Masters, Katie, DO       potassium chloride 10 mEq in 100 mL IVPB  10 mEq Intravenous Q1 Hr x 2 Leigh Aurora, DO 100 mL/hr at 06/23/22 0734 10 mEq at 06/23/22 0734    Allergies as of 06/22/2022 - Review Complete 06/22/2022  Allergen Reaction Noted   Lisinopril Cough 03/30/2012    Review of Systems:    Constitutional: No weight loss, fever, chills, weakness or fatigue HEENT: Eyes: No change in vision               Ears, Nose, Throat:  No change in hearing or congestion Skin: No rash or itching Cardiovascular: No chest pain, chest pressure or palpitations   Respiratory: No SOB or cough Gastrointestinal: See HPI and otherwise negative Genitourinary: No dysuria or change in urinary frequency Neurological: No headache, dizziness or syncope Musculoskeletal: No new muscle or joint pain Hematologic: No bleeding or bruising Psychiatric: No  history of depression or anxiety  Physical Exam:  Vital signs in last 24 hours: Temp:  [97.9 F (36.6 C)-98.4 F (36.9 C)] 98.4 F (36.9 C) (02/07 0418) Pulse Rate:  [95-111] 98 (02/07 0418) Resp:  [11-19] 18 (02/07 0418) BP: (114-144)/(60-100) 126/60 (02/07 0418) SpO2:  [93 %-100 %] 98 % (02/07 0418) Weight:  [124.7 kg-129.4 kg] 124.7 kg (02/07 0106) Last BM Date : 06/18/22 Last BM recorded by nurses in past 5 days No data recorded  General:   Pleasant, well developed male in no acute distress Head:  Normocephalic and atraumatic. Eyes: sclerae anicteric,conjunctive pink  Heart:  regular rate and rhythm Pulm: Clear anteriorly; no wheezing Abdomen:  Soft, Obese AB, Active bowel sounds. No tenderness . Without guarding and Without rebound, No organomegaly appreciated. Extremities:  Without edema. Msk:  Symmetrical without gross deformities. Peripheral pulses intact.  Neurologic:  Alert and  oriented x4;  No focal deficits.  Skin:   Dry and intact without significant lesions or rashes. Psychiatric:  Cooperative. Normal mood and affect.  LAB RESULTS: Recent Labs    06/22/22 1902 06/23/22 0451  WBC 8.8 7.3  HGB 17.0 15.0  HCT 50.8 45.9  PLT 192 149*   BMET Recent Labs    06/22/22 1902 06/23/22 0451  NA 135 137  K 4.0 3.4*  CL 95* 98  CO2 26 27  GLUCOSE 215* 148*  BUN 23 19  CREATININE 1.94* 1.81*  CALCIUM 9.7 8.6*   LFT Recent Labs    06/22/22 1902  PROT 8.4*  ALBUMIN 4.3  AST 13*  ALT 12  ALKPHOS 93  BILITOT 0.8   PT/INR No results for input(s): "LABPROT", "INR" in the last 72 hours.  STUDIES: CT ABDOMEN PELVIS WO CONTRAST  Result Date: 06/22/2022 CLINICAL DATA:  Acute abdominal pain and vomiting. Known ventral hernia. Occasional hematemesis. EXAM: CT ABDOMEN AND PELVIS WITHOUT CONTRAST TECHNIQUE: Multidetector CT imaging of the abdomen and pelvis was performed following the standard protocol without IV contrast. RADIATION DOSE REDUCTION: This exam  was performed according to the departmental dose-optimization program which includes automated exposure control, adjustment of the mA and/or kV according to patient size and/or use of iterative reconstruction technique. COMPARISON:  CT with IV and oral contrast 05/27/2021, CT with IV contrast 02/02/2018. FINDINGS: Lower chest: The cardiac size is normal. There is a small hiatal hernia. Lung bases are clear. Hepatobiliary: The liver 16 cm length, mildly steatotic. No focal abnormality is seen without contrast. The gallbladder mildly distended but without wall thickening, calcified stones or biliary dilatation. Pancreas: Partially fatty atrophic, especially in the head, neck and uncinate process. There are a few calcifications in the pancreatic head consistent with chronic calcific pancreatitis. No focal abnormality is seen without contrast, no inflammatory changes or ductal dilatation. Spleen: Unremarkable without contrast. No splenomegaly. Small splenule noted inferomedially. Adrenals/Urinary Tract: There is no adrenal mass. There is bilateral fetal lobation of the kidneys without contour deforming mass identifiable without contrast. There is no urinary stone or obstruction. The bladder is contracted and not well seen but could be mildly thickened although is similar in appearance to the previous studies. Stomach/Bowel: Previous attempted ventral mesh hernia repair with recurrent broad-based wide mouth large ventral/umbilical hernia with mesh failure superolaterally, is again noted. There are multiple nonobstructed small bowel loops contained in the large hernia sac, with rectus diastasis. No small bowel obstruction or inflammation is seen. The gastric wall is unremarkable. The appendix extends medially from the cecum and is normal caliber. There is mild-to-moderate fecal stasis. There is  diverticulosis without evidence of diverticulitis. Vascular/Lymphatic: Aortic atherosclerosis. No enlarged abdominal or pelvic  lymph nodes. Reproductive: Prior TURP.  No recurrent prostatomegaly. Other: There is no free air, free hemorrhage, free fluid or incarcerated hernia. Small left inguinal fat hernia. Large ventral hernia described above. There are no acute inflammatory changes. Musculoskeletal: Mild degenerative change lumbar spine. Bilateral bridging osteophytes of the SI joints. No acute or new osseous findings. Mild hip DJD. Once again, L5-S1 disc bulge mildly compresses both S1 nerve roots. IMPRESSION: 1. No acute noncontrast CT findings. 2. Constipation and diverticulosis. 3. Large ventral/umbilical hernia containing multiple nonobstructed small bowel loops, in the setting of failed ventral mesh hernia repair. Small hiatal hernia. 4. Aortic atherosclerosis. 5. Chronic calcific pancreatitis. 6. Chronic bladder thickening which could be due to nondistention, cystitis or chronic outlet obstruction. Prior TURP. 7. Small left inguinal fat hernia. 8. L5-S1 disc bulge mildly compresses both S1 nerve roots. Aortic Atherosclerosis (ICD10-I70.0). Electronically Signed   By: Telford Nab M.D.   On: 06/22/2022 21:19   DG Chest Portable 1 View  Result Date: 06/22/2022 CLINICAL DATA:  Hematemesis EXAM: PORTABLE CHEST 1 VIEW COMPARISON:  08/10/2019 FINDINGS: Heart size upper limits of normal. Mediastinal shadows are normal. The lungs are clear. No edema. No infiltrate, collapse or effusion. No abnormal bone finding. IMPRESSION: No active disease. Electronically Signed   By: Nelson Chimes M.D.   On: 06/22/2022 21:04     Vladimir Crofts  06/23/2022, 7:57 AM

## 2022-06-23 NOTE — Anesthesia Procedure Notes (Signed)
Procedure Name: Intubation Date/Time: 06/23/2022 2:01 PM  Performed by: Janene Harvey, CRNAPre-anesthesia Checklist: Patient identified, Emergency Drugs available, Suction available and Patient being monitored Patient Re-evaluated:Patient Re-evaluated prior to induction Oxygen Delivery Method: Circle system utilized Preoxygenation: Pre-oxygenation with 100% oxygen Induction Type: IV induction, Rapid sequence and Cricoid Pressure applied Laryngoscope Size: Mac and 4 Grade View: Grade II Tube type: Oral Tube size: 8.0 mm Number of attempts: 1 Airway Equipment and Method: Stylet and Oral airway Placement Confirmation: ETT inserted through vocal cords under direct vision, positive ETCO2 and breath sounds checked- equal and bilateral Secured at: 23 cm Tube secured with: Tape Dental Injury: Teeth and Oropharynx as per pre-operative assessment

## 2022-06-23 NOTE — Op Note (Signed)
[facility] Patient Name: Terry Parks Procedure Date : 06/23/2022 MRN: [id number] Attending MD: Gerrit Heck , MD, [id number] Date of Birth: [birth year] CSN: [id number] Age: 67 Admit Type: Inpatient Procedure:                Upper GI endoscopy w/ control of bleeding and biopsy Indications:              Hematemesis Providers:                Gerrit Heck, MD, Gabriel Earing, RN,                            Benetta Spar, Technician Referring MD:              Medicines:                Monitored Anesthesia Care Complications:            No immediate complications. Estimated Blood Loss:     Estimated blood loss was minimal. Procedure:                Pre-Anesthesia Assessment:                           - Prior to the procedure, a History and Physical                            was performed, and patient medications and                            allergies were reviewed. The patient's tolerance of                            previous anesthesia was also reviewed. The risks                            and benefits of the procedure and the sedation                            options and risks were discussed with the patient.                            All questions were answered, and informed consent                            was obtained. Prior Anticoagulants: The patient has                            taken Xarelto (rivaroxaban), last dose was 2 days                            prior to procedure. ASA Grade Assessment: III - A                            patient with severe systemic disease. After  reviewing the risks and benefits, the patient was                            deemed in satisfactory condition to undergo the                            procedure.                           After obtaining informed consent, the endoscope was                            passed under direct vision. Throughout the                             procedure, the patient's blood pressure, pulse, and                            oxygen saturations were monitored continuously. The                            [id number]-[id number] ([id number] Olympus endoscope was introduced                            through the mouth, and advanced to the third part                            of duodenum. The upper GI endoscopy was                            accomplished without difficulty. The patient                            tolerated the procedure well. Scope In: Scope Out: Findings:      LA Grade D (one or more mucosal breaks involving at least 75% of       esophageal circumference) esophagitis was found 33 to 43 cm from the       incisors.      One linear esophageal ulcer with stigmata of recent bleeding was found       in the lower esophagus. For hemostasis, one hemostatic clip was       successfully placed (MR conditional). Clip manufacturer: [employer].      Segmental mild inflammation characterized by congestion (edema) and       erythema was found in the entire examined stomach. Biopsies were taken       with a cold forceps for histology and Helicobacter pylori testing.       Estimated blood loss was minimal.      The examined duodenum was normal. Impression:               - LA Grade D reflux esophagitis.                           - Esophageal ulcer with stigmata of recent  bleeding. Clip (MR conditional) was placed. Clip                            manufacturer: [employer].                           - Mild, non-ulcer gastritis. Biopsied.                           - Normal examined duodenum. Recommendation:           - Return patient to hospital ward for ongoing care.                           - Full liquid diet today, then advance to soft                            foods tomorrow, and can resume previous diet in 48                            hours.                           - Resume Xarelto (rivaroxaban) at  prior dose in 3                            days.                           - Use Nexium (esomeprazole) 40 mg PO BID for 8                            weeks (at least through repeat EGD), then reduce to                            40 mg daily if no continued esophagitis on repeat                            outpatient EGD.                           - Use sucralfate suspension 1 gram PO QID for 2                            weeks, then twice daily for 2 more weeks.                           - Await pathology results.                           - Repeat upper endoscopy in 6-8 weeks to check                            healing.                           -  Return to GI office at appointment to be                            scheduled.                           - May need to consider cessation of Ozempic, as                            this could be contributing to esophageal refluxate                            and uncontrolled esophagitis.                           - Inpatient GI service will sign off at this time.                            Please do not hesistate to contact with additional                            questions or concerns. Procedure Code(s):        --- Professional ---                           [phone], 51, Esophagogastroduodenoscopy, flexible,                            transoral; with control of bleeding, any method                           43239, Esophagogastroduodenoscopy, flexible,                            transoral; with biopsy, single or multiple Diagnosis Code(s):        --- Professional ---                           K21.00, Gastro-esophageal reflux disease with                            esophagitis, without bleeding                           K22.11, Ulcer of esophagus with bleeding                           K29.70, Gastritis, unspecified, without bleeding                           K92.0, Hematemesis CPT copyright 2022 [employer]. All rights  reserved. The codes documented in this report are preliminary and upon coder review may  be revised to meet current compliance requirements. Gerrit Heck, MD 06/23/2022 2:30:03 PM Number of Addenda: 0

## 2022-06-23 NOTE — Anesthesia Preprocedure Evaluation (Addendum)
Anesthesia Evaluation  Patient identified by MRN, date of birth, ID band Patient awake    Reviewed: Allergy & Precautions, NPO status , Patient's Chart, lab work & pertinent test results  History of Anesthesia Complications Negative for: history of anesthetic complications  Airway Mallampati: III  TM Distance: >3 FB Neck ROM: Full    Dental  (+) Missing, Dental Advisory Given,    Pulmonary shortness of breath, asthma , sleep apnea    breath sounds clear to auscultation       Cardiovascular hypertension, Pt. on medications +CHF  + dysrhythmias  Rhythm:Regular   1. Left ventricular ejection fraction, by visual estimation, is 35 to  40%. The left ventricle has moderately decreased function. Normal left  ventricular size. There is moderate left ventricular hypertrophy. There is  blobal left ventricular hypokinesis,  without regional variation.   2. Left ventricular diastolic Doppler parameters are consistent with  impaired relaxation pattern of LV diastolic filling.   3. Global right ventricle has mildly reduced systolic function.The right  ventricular size is not well visualized. Right vetricular wall thickness  was not assessed.   4. Left atrial size was mildly dilated.   5. Right atrial size was mildly dilated.   6. The aortic valve is normal in structure. Aortic valve regurgitation  was not visualized by color flow Doppler.   7. The aortic root was not well visualized.   8. TR signal is inadequate for assessing pulmonary artery systolic  pressure.   9. The inferior vena cava is normal in size with greater than 50%  respiratory variability, suggesting right atrial pressure of 3 mmHg.  10. The mitral valve is normal in structure. No evidence of mitral valve  regurgitation.  11. The tricuspid valve is normal in structure. Tricuspid valve  regurgitation was not visualized by color flow Doppler.  12. The pulmonic valve was not  well visualized. Pulmonic valve  regurgitation is not visualized by color flow Doppler.     Neuro/Psych  Headaches PSYCHIATRIC DISORDERS Anxiety Depression     Neuromuscular disease    GI/Hepatic Neg liver ROS,GERD  ,,  Endo/Other  diabetes    Renal/GU CRFRenal diseaseLab Results      Component                Value               Date                      CREATININE               1.81 (H)            06/23/2022                Musculoskeletal negative musculoskeletal ROS (+)    Abdominal   Peds  Hematology Lab Results      Component                Value               Date                      WBC                      7.3                 06/23/2022  HGB                      15.0                06/23/2022                HCT                      45.9                06/23/2022                MCV                      84.5                06/23/2022                PLT                      149 (L)             06/23/2022              Anesthesia Other Findings   Reproductive/Obstetrics                             Anesthesia Physical Anesthesia Plan  ASA: 3  Anesthesia Plan: General   Post-op Pain Management: Minimal or no pain anticipated   Induction: Intravenous and Rapid sequence  PONV Risk Score and Plan: 2 and Ondansetron and Dexamethasone  Airway Management Planned: Oral ETT  Additional Equipment: None  Intra-op Plan:   Post-operative Plan: Extubation in OR  Informed Consent: I have reviewed the patients History and Physical, chart, labs and discussed the procedure including the risks, benefits and alternatives for the proposed anesthesia with the patient or authorized representative who has indicated his/her understanding and acceptance.     Dental advisory given  Plan Discussed with: CRNA  Anesthesia Plan Comments:        Anesthesia Quick Evaluation

## 2022-06-23 NOTE — Consult Note (Addendum)
Attending physician's note   I have taken a history, reviewed the chart, and examined the patient. I performed a substantive portion of this encounter, including complete performance of at least one of the key components, in conjunction with the APP. I agree with the APP's note, impression, and recommendations with my edits.   67 year old male with medical history as outlined below, to include history of A-fib (on Xarelto), HTN, HLD, OSA, diabetes, CHF (EF 35-40%), prostate CA, GERD with erosive esophagitis (EGD 05/2021 with LA Grade D erosive esophagitis, 3 cm HH), presenting with hematemesis.  No previous history of GI bleed.  Reports that he was overeating yesterday then sudden onset nausea/vomiting with first episode of vomiting containing bright red blood, then coffee-ground emesis on the last time he vomited.  Longstanding history of reflux, but he states that has been otherwise well-controlled with Nexium 40 mg daily and takes an additional OTC antacid on demand.  Hgb down 2 g since arrival, but otherwise HD stable.  1) Hematemesis - Plan for expedited EGD today for diagnostic and therapeutic intent - Started on high-dose IV PPI - Serial CBC checks - Was prescribed IV Reglan prior to transport to endoscopy unit, but he declined on the floor  2) GERD with erosive esophagitis 3) Hiatal hernia - Evaluate for erosive esophagitis at time of EGD as above - PPI as above  4) A-fib 5) Chronic anticoagulation 6) CHF with reduced EF - Holding Xarelto.  Last dose was 06/21/2022 - Management per primary Hospital service  7) Diabetes - Last dose of Ozempic was 2 days ago.  Discussed with Anesthesia staff.  Will elect for intubation due to elevated risk for retained gastric contents  The indications, risks, and benefits of EGD were explained to the patient in detail. Risks include but are not limited to bleeding, perforation, adverse reaction to medications, and cardiopulmonary compromise.  Sequelae include but are not limited to the possibility of surgery, hospitalization, and mortality. The patient verbalized understanding and wished to proceed.  [address] DO, [place] [phone] office          Consultation  Referring Provider:   [facility] Primary Care Physician:  [name], MD Primary Gastroenterologist:  Dr. Rush Landmark       Reason for Consultation:   Hematemesis   Impression    Hematemesis 2 g drop of hemoglobin 17-15 Negative FOBT On Xarelto, last dose 06/21/2022 EGD 06/09/2021 with grade D esophagitis, 3 cm hiatal hernia  A-fib on Xarelto Last dose 06/21/2022  Elevated lipase Unremarkable CT, likely from emesis  Chronic combined systolic and diastolic heart failure 38/2505 echo EF 35 to 39% impaired diastolic filling no AAS. Appears euvolemic  Principal Problem:   Hematemesis Active Problems:   Upper GI bleed    LOS: 0 days     Plan   - Protonix 40 mg IV BID. --Continue to monitor H&H with transfusion as needed to maintain hemoglobin greater than 7. - NPO  except gave patient ice chips this AM only -Plan for EGD today. I thoroughly discussed the procedure to include nature, alternatives, benefits, and risks including but not limited to bleeding, perforation, infection, anesthesia/cardiac and pulmonary complications.  Patient provides understanding and gave verbal consent to proceed. -Will give 5 mg IV Reglan minutes prior to endoscopy - recall colon 2030  Thank you for your kind consultation, we will continue to follow.         HPI:   Terry Parks is a 67 y.o. male  with past medical history significant for Afib on xarelto, hypertension, hyperlipidemia, OSA, diabetes, HFrEF, prostate cancer status post prostatectomy, GERD with grade D esophagitis, hemorrhoids, prior IDA presents with hematemesis  05/20/2021 office visit outpatient with Dr. Rush Landmark for epigastric abdominal pain 06/09/2021 EGD Dr. Rush Landmark for IDA, coffee-ground  emesis, epigastric pain showed LA grade D esophagitis, 3 cm hiatal hernia, normal stomach and duodenum.  Pathology showed acute erosive esophagitis negative for fungal organisms, viral, dysplasia.  This was have repeat endoscopy 4 months however this was never done. So to be on Nexium twice daily and Carafate.  Patient presented to [place] ED yesterday after 3 episodes of hematemesis. First episode of blood bright red, second episode coffee-ground emesis. Denies melena or hematochezia. Denies NSAIDs. Denies chest pain or shortness of breath.  In the ER patient had tachycardia, otherwise hemodynamically stable. Lipase 205 Creatinine 1.94 Hemoglobin 17, repeat hemoglobin this morning 15, no leukocytosis Negative Hemoccult blood test CT abdomen pelvis in the ER showed large ventral/umbilical hernia, chronic calcific pancreatitis but no acute abdomen Chest x-ray unremarkable   No family was present at the time of my evaluation.  Abnormal ED labs: Abnormal Labs Reviewed  LIPASE, BLOOD - Abnormal; Notable for the following components:      Result Value   Lipase 205 (*)    All other components within normal limits  COMPREHENSIVE METABOLIC PANEL - Abnormal; Notable for the following components:   Chloride 95 (*)    Glucose, Bld 215 (*)    Creatinine, Ser 1.94 (*)    Total Protein 8.4 (*)    AST 13 (*)    GFR, Estimated 38 (*)    All other components within normal limits  CBC - Abnormal; Notable for the following components:   RBC 6.13 (*)    All other components within normal limits  CBC - Abnormal; Notable for the following components:   Platelets 149 (*)    All other components within normal limits  BASIC METABOLIC PANEL - Abnormal; Notable for the following components:   Potassium 3.4 (*)    Glucose, Bld 148 (*)    Creatinine, Ser 1.81 (*)    Calcium 8.6 (*)    GFR, Estimated 41 (*)    All other components within normal limits  GLUCOSE, CAPILLARY - Abnormal; Notable for  the following components:   Glucose-Capillary 147 (*)    All other components within normal limits  CBG MONITORING, ED - Abnormal; Notable for the following components:   Glucose-Capillary 227 (*)    All other components within normal limits     Past Medical History:  Diagnosis Date   Abscessed tooth    top back large cavity no pain or drainage, one on bottom  pt pulled tooth 4-5 months ago, right top large hole in tooth   AKI (acute kidney injury) (HCC)    Allergic rhinitis    Anemia    Anxiety    Asthma    Atrial fibrillation (HCC)    BPH (benign prostatic hypertrophy)    Massive BPH noted on cystoscopy 1/23/ 2012 by Dr. Risa Grill.   Cancer [facility]    prostate cancer 2019   Cardiomyopathy [facility])    CHF (congestive heart failure) ([place])    Cough 03/30/2012   Depression    Diabetes mellitus 04/08/2008   type 2   Dyspnea    Dysrhythmia 2019   Foley catheter in place 07-05-17 placed   Fracture, orbital ([place]) 2021   Right   GERD (gastroesophageal reflux  disease) 2020   Headache(784.0)    hx migraines none recent   History of esophagitis 12/16/2019   Hyperlipemia    Hypertension    Hypertensive cardiopathy 03/01/2006   2-D echocardiogram 02/01/2012 showed moderate LVH, mildly to moderately reduced left ventricular systolic function with an estimated ejection fraction of 40-45%, and diffuse hypokinesis.  A nuclear medicine stress study done 01/31/2012 showed no reversible ischemia, a small mid anterior wall fixed defect/infarct, and ejection fraction 42%.       Neck pain    Nephrolithiasis 05/29/2010   CT scan of abdomen/pelvis on 05/29/2010 showed an obstructing approximate 1-2 mm calculus at the left UVJ, and an approximate 1-2 mm left lower pole renal calculus.   Patient had continuing severe pain , and an elevation of his serum creatinine to a value of 1.75 on 06/06/2010.  Patient underwent cystoscopy on 06/08/2010 by Dr. Risa Grill, but attempts at retrograde pyelogram and ureteroscopy were  unsucc   Numbness 01/08/2018   Obstructive sleep apnea 03/06/2008   Sleep study 03/06/08 showed severe OSA/hypopnea syndrome, with successful CPAP titration to 13 CWP using a medium ResMed Mirage Quattro full face mask with heated humidifier.    Rash 04/17/2014   Renal calculus 05/29/2010   CT scan of abdomen/pelvis on 05/29/2010 showed an obstructing approximate 1-2 mm calculus at the left UVJ, and an approximate 1-2 mm left lower pole renal calculus.   Patient had continuing severe pain , and an elevation of his serum creatinine to a value of 1.75 on 06/06/2010.  The stone had apparently passed and was not seen on repeat CT 06/08/2010.   Sleep apnea    haven't use cpap in 2 years   Tooth pain 10/29/2017   Urinary straining 11/02/2016    Surgical History:  He  has a past surgical history that includes Cystoscopy w/ retrogrades; Cardioversion (N/A, 06/08/2017); big toe nails removed (Bilateral, 20 yrs ago); Transurethral resection of bladder tumor (N/A, 07/13/2017); Robot assisted laparoscopic radical prostatectomy (N/A, 10/06/2017); Lymphadenectomy (Bilateral, 10/06/2017); IR Radiologist Eval & Mgmt (02/02/2018); Colonoscopy; Fracture surgery (Right, 2021); and Incisional hernia repair (N/A, 03/24/2020). Family History:  His family history includes Breast cancer in his mother; Diabetes in his maternal grandmother and mother; Hypertension in his father; Stroke in his father; Ulcerative colitis in his mother. Social History:   reports that he has never smoked. He has never used smokeless tobacco. He reports that he does not drink alcohol and does not use drugs.  Prior to Admission medications   Medication Sig Start Date End Date Taking? Authorizing Provider  Accu-Chek Softclix Lancets lancets Check blood sugar three times a day as instructed 05/13/22   Lacinda Axon, MD  amiodarone (PACERONE) 200 MG tablet TAKE ONE TABLET BY MOUTH EVERY MORNING 11/02/21   [name], [name], MD  amLODipine (NORVASC) 5 MG  tablet TAKE ONE TABLET BY MOUTH EVERY EVENING 06/22/21   [name], [name], MD  amoxicillin (AMOXIL) 500 MG capsule Take 1 capsule (500 mg total) by mouth every 6 (six) hours until gone. 10/01/21     atorvastatin (LIPITOR) 40 MG tablet TAKE ONE TABLET BY MOUTH EVERY EVENING 06/22/21   [name], [name], MD  Blood Glucose Monitoring Suppl (ACCU-CHEK GUIDE) w/Device KIT 1 Units by Does not apply route 3 (three) times daily. 11/25/20   [name], MD  Blood Glucose Monitoring Suppl (BLOOD GLUCOSE MONITOR SYSTEM) w/Device KIT Use up to 4 (four) times daily as directed 10/16/21   [name], MD  carvedilol (COREG) 25 MG  tablet TAKE ONE TABLET BY MOUTH EVERY MORNING and TAKE ONE TABLET BY MOUTH EVERY EVENING 07/13/21   [name], [name], MD  COMFORT EZ PEN NEEDLES 31G X 6 MM MISC USE AS DIRECTED AT BEDTIME 06/23/21   [name], MD  COMFORT EZ PEN NEEDLES 32G X 6 MM MISC  05/25/21   [provider]  DULoxetine (CYMBALTA) 60 MG capsule Take 1 capsule (60 mg total) by mouth daily. 11/02/21 10/28/22  [name], MD  empagliflozin (JARDIANCE) 25 MG TABS tablet TAKE 1 TABLET (25 MG TOTAL) BY MOUTH DAILY BEFORE BREAKFAST. 11/02/21 11/02/22  [name], MD  esomeprazole (NEXIUM) 40 MG capsule Take 1 capsule (40 mg total) by mouth 2 (two) times daily before a meal. 06/09/21   Mansouraty, [name], MD  ezetimibe (ZETIA) 10 MG tablet TAKE ONE TABLET BY MOUTH EVERY EVENING 05/13/22   [name], [name], MD  glucose blood (ACCU-CHEK GUIDE) test strip USE TO check blood glucose THREE TIMES DAILY AS DIRECTED 05/13/22   Lacinda Axon, MD  insulin glargine (LANTUS SOLOSTAR) 100 UNIT/ML Solostar Pen Inject 52 Units into the skin daily. 05/13/22 05/08/23  Lacinda Axon, MD  Ipratropium-Albuterol (COMBIVENT RESPIMAT) 20-100 MCG/ACT AERS respimat Inhale 1 puff into the lungs every 6 (six) hours. 10/16/21   [name], MD  rivaroxaban (XARELTO) 15 MG TABS tablet Take 1 tablet (15 mg total) by mouth  daily with breakfast. 10/06/21   [name], [name], MD  sacubitril-valsartan (ENTRESTO) 97-103 MG TAKE ONE TABLET BY MOUTH EVERY MORNING and TAKE ONE TABLET BY MOUTH EVERY EVENING 07/13/21   [name], [name], MD  Semaglutide, 2 MG/DOSE, (OZEMPIC, 2 MG/DOSE,) 8 MG/3ML SOPN Inject 2 mg into the skin once a week. 04/20/22   [name], MD  spironolactone (ALDACTONE) 50 MG tablet TAKE ONE TABLET BY MOUTH EVERY MORNING 06/22/21   [name], [name], MD  torsemide (DEMADEX) 20 MG tablet TAKE TWO TABLETS BY MOUTH EVERY MORNING 04/20/22   [name], [name], MD    Current Facility-Administered Medications  Medication Dose Route Frequency Provider Last Rate Last Admin   insulin aspart (novoLOG) injection 0-15 Units  0-15 Units Subcutaneous Q4H Masters, Katie, DO   2 Units at 06/23/22 0447   insulin glargine-yfgn [facility]) injection 30 Units  30 Units Subcutaneous QHS Masters, Katie, DO       lactated ringers infusion   Intravenous Continuous Masters, [place], DO 100 mL/hr at 06/23/22 0158 New Bag at 06/23/22 0158   pantoprazole (PROTONIX) injection 40 mg  40 mg Intravenous Q12H Masters, Katie, DO       potassium chloride 10 mEq in 100 mL IVPB  10 mEq Intravenous Q1 Hr x 2 Leigh Aurora, DO 100 mL/hr at 06/23/22 0734 10 mEq at 06/23/22 0734    Allergies as of 06/22/2022 - Review Complete 06/22/2022  Allergen Reaction Noted   Lisinopril Cough 03/30/2012    Review of Systems:    Constitutional: No weight loss, fever, chills, weakness or fatigue HEENT: Eyes: No change in vision               Ears, Nose, Throat:  No change in hearing or congestion Skin: No rash or itching Cardiovascular: No chest pain, chest pressure or palpitations   Respiratory: No SOB or cough Gastrointestinal: See HPI and otherwise negative Genitourinary: No dysuria or change in urinary frequency Neurological: No headache, dizziness or syncope Musculoskeletal: No new muscle or joint pain Hematologic: No bleeding or bruising Psychiatric: No  history of depression or anxiety  Physical Exam:  Vital signs in last 24 hours: Temp:  [97.9 F (36.6 C)-98.4 F (36.9 C)] 98.4 F (36.9 C) (02/07 0418) Pulse Rate:  [95-111] 98 (02/07 0418) Resp:  [11-19] 18 (02/07 0418) BP: (114-144)/(60-100) 126/60 (02/07 0418) SpO2:  [93 %-100 %] 98 % (02/07 0418) Weight:  [124.7 kg-129.4 kg] 124.7 kg (02/07 0106) Last BM Date : 06/18/22 Last BM recorded by nurses in past 5 days No data recorded  General:   Pleasant, well developed male in no acute distress Head:  Normocephalic and atraumatic. Eyes: sclerae anicteric,conjunctive pink  Heart:  regular rate and rhythm Pulm: Clear anteriorly; no wheezing Abdomen:  Soft, Obese AB, Active bowel sounds. No tenderness . Without guarding and Without rebound, No organomegaly appreciated. Extremities:  Without edema. Msk:  Symmetrical without gross deformities. Peripheral pulses intact.  Neurologic:  Alert and  oriented x4;  No focal deficits.  Skin:   Dry and intact without significant lesions or rashes. Psychiatric:  Cooperative. Normal mood and affect.  LAB RESULTS: Recent Labs    06/22/22 1902 06/23/22 0451  WBC 8.8 7.3  HGB 17.0 15.0  HCT 50.8 45.9  PLT 192 149*   BMET Recent Labs    06/22/22 1902 06/23/22 0451  NA 135 137  K 4.0 3.4*  CL 95* 98  CO2 26 27  GLUCOSE 215* 148*  BUN 23 19  CREATININE 1.94* 1.81*  CALCIUM 9.7 8.6*   LFT Recent Labs    06/22/22 1902  PROT 8.4*  ALBUMIN 4.3  AST 13*  ALT 12  ALKPHOS 93  BILITOT 0.8   PT/INR No results for input(s): "LABPROT", "INR" in the last 72 hours.  STUDIES: CT ABDOMEN PELVIS WO CONTRAST  Result Date: 06/22/2022 CLINICAL DATA:  Acute abdominal pain and vomiting. Known ventral hernia. Occasional hematemesis. EXAM: CT ABDOMEN AND PELVIS WITHOUT CONTRAST TECHNIQUE: Multidetector CT imaging of the abdomen and pelvis was performed following the standard protocol without IV contrast. RADIATION DOSE REDUCTION: This exam  was performed according to the departmental dose-optimization program which includes automated exposure control, adjustment of the mA and/or kV according to patient size and/or use of iterative reconstruction technique. COMPARISON:  CT with IV and oral contrast 05/27/2021, CT with IV contrast 02/02/2018. FINDINGS: Lower chest: The cardiac size is normal. There is a small hiatal hernia. Lung bases are clear. Hepatobiliary: The liver 16 cm length, mildly steatotic. No focal abnormality is seen without contrast. The gallbladder mildly distended but without wall thickening, calcified stones or biliary dilatation. Pancreas: Partially fatty atrophic, especially in the head, neck and uncinate process. There are a few calcifications in the pancreatic head consistent with chronic calcific pancreatitis. No focal abnormality is seen without contrast, no inflammatory changes or ductal dilatation. Spleen: Unremarkable without contrast. No splenomegaly. Small splenule noted inferomedially. Adrenals/Urinary Tract: There is no adrenal mass. There is bilateral fetal lobation of the kidneys without contour deforming mass identifiable without contrast. There is no urinary stone or obstruction. The bladder is contracted and not well seen but could be mildly thickened although is similar in appearance to the previous studies. Stomach/Bowel: Previous attempted ventral mesh hernia repair with recurrent broad-based wide mouth large ventral/umbilical hernia with mesh failure superolaterally, is again noted. There are multiple nonobstructed small bowel loops contained in the large hernia sac, with rectus diastasis. No small bowel obstruction or inflammation is seen. The gastric wall is unremarkable. The appendix extends medially from the cecum and is normal caliber. There is mild-to-moderate fecal stasis. There is  diverticulosis without evidence of diverticulitis. Vascular/Lymphatic: Aortic atherosclerosis. No enlarged abdominal or pelvic  lymph nodes. Reproductive: Prior TURP.  No recurrent prostatomegaly. Other: There is no free air, free hemorrhage, free fluid or incarcerated hernia. Small left inguinal fat hernia. Large ventral hernia described above. There are no acute inflammatory changes. Musculoskeletal: Mild degenerative change lumbar spine. Bilateral bridging osteophytes of the SI joints. No acute or new osseous findings. Mild hip DJD. Once again, L5-S1 disc bulge mildly compresses both S1 nerve roots. IMPRESSION: 1. No acute noncontrast CT findings. 2. Constipation and diverticulosis. 3. Large ventral/umbilical hernia containing multiple nonobstructed small bowel loops, in the setting of failed ventral mesh hernia repair. Small hiatal hernia. 4. Aortic atherosclerosis. 5. Chronic calcific pancreatitis. 6. Chronic bladder thickening which could be due to nondistention, cystitis or chronic outlet obstruction. Prior TURP. 7. Small left inguinal fat hernia. 8. L5-S1 disc bulge mildly compresses both S1 nerve roots. Aortic Atherosclerosis (ICD10-I70.0). Electronically Signed   By: Telford Nab M.D.   On: 06/22/2022 21:19   DG Chest Portable 1 View  Result Date: 06/22/2022 CLINICAL DATA:  Hematemesis EXAM: PORTABLE CHEST 1 VIEW COMPARISON:  08/10/2019 FINDINGS: Heart size upper limits of normal. Mediastinal shadows are normal. The lungs are clear. No edema. No infiltrate, collapse or effusion. No abnormal bone finding. IMPRESSION: No active disease. Electronically Signed   By: Nelson Chimes M.D.   On: 06/22/2022 21:04     Vladimir Crofts  06/23/2022, 7:57 AM

## 2022-06-23 NOTE — Hospital Course (Addendum)
Concern for Upper GI bleed Grade D esophagitis  Presented with 3 episodes of hematemesis at home. Hx of grade D esophagitis with last EGD on 06/09/21 showing erosive esophagitis without bleeding. Hemoglobin in normal range. Patient is on Xarelto. Denies NSAID use. GI performed EGD showing grade D reflux esophagitis, esophageal ulcer with stigmata of recent bleeding, clip placed, and mild non-ulcer gastritis. No further hematemesis since the initial 3 episodes. Advanced diet and tolerated well. Patient to continue with Nexium and Carafate. Follow with outpatient GI in March and repeat EGD in 6-8 weeks.   AKI on CKD 3b Creatinine on admission 1.94 with GFR 38. AKI improved after IV fluids.    T2DM Last A1c 12.7 in December. Home meds include glargine 52 units, semaglutide 2 mg, and empagliflozin 25 mg. Patient was initially started on semglee 30 units and SSI given NPO status then transition to liquid diet. CBGs were elevated after restarting diet, increased long acting to home dose. GI recommend stopping Ozempic as it could be contributing to his reflux and uncontrolled esophagitis.    Chronic pancreatitis  Lipase found to be 205. CT showed chronic calcific pancreatitis. Patient did not have abdominal pain. No gallstones seen on CT. Denies alcohol or tobacco use. Triglycerides normal last year. No acute concerns. Can continue outpatient follow up.    HFrEF HTN HLD Last echo showed EF 35-40%. Follows with outpatient cardiology. Did not present hypervolemic. BP was normotensive. Resumed his home medications.   Paroxysmal Afib  Hx of PAF. Patient on Xarelto and amiodarone and has been adherent. EKG showed sinus tachycardia. Held Xarelto in setting of hematemesis. Resumed home amiodarone. Patient will benefit with switching to Eliquis given studies showing less GI bleed risk compared to Xarelto. He will resume anticoagulation 3 days after EGD.

## 2022-06-23 NOTE — Telephone Encounter (Signed)
-----   Message from Marydel, DO sent at 06/23/2022  2:33 PM EST ----- Terry Parks, This is a patient of Dr. Rush Landmark admitted with hematemesis, severe erosive esophagitis, esophageal ulcer requiring endoscopic clip placement.  I anticipate discharge home within the next 24 hours or so.  Can you please arrange for the following:  - Outpatient follow-up with [facility] or one of the APP's in the next 3-4 weeks - Outpatient EGD with [facility] in 6-8 weeks  Thank you!

## 2022-06-23 NOTE — Interval H&P Note (Signed)
History and Physical Interval Note:  06/23/2022 1:50 PM  Terry Parks  has presented today for surgery, with the diagnosis of Hematemesis.  The various methods of treatment have been discussed with the patient and family. After consideration of risks, benefits and other options for treatment, the patient has consented to  Procedure(s): ESOPHAGOGASTRODUODENOSCOPY (EGD) WITH PROPOFOL (N/A) as a surgical intervention.  The patient's history has been reviewed, patient examined, no change in status, stable for surgery.  I have reviewed the patient's chart and labs.  Questions were answered to the patient's satisfaction.     Dominic Pea Jadalyn Oliveri

## 2022-06-24 ENCOUNTER — Other Ambulatory Visit: Payer: Self-pay

## 2022-06-24 ENCOUNTER — Other Ambulatory Visit (HOSPITAL_COMMUNITY): Payer: Self-pay

## 2022-06-24 DIAGNOSIS — K920 Hematemesis: Secondary | ICD-10-CM | POA: Diagnosis not present

## 2022-06-24 DIAGNOSIS — K2081 Other esophagitis with bleeding: Secondary | ICD-10-CM | POA: Diagnosis not present

## 2022-06-24 DIAGNOSIS — K2210 Ulcer of esophagus without bleeding: Secondary | ICD-10-CM

## 2022-06-24 DIAGNOSIS — K2211 Ulcer of esophagus with bleeding: Secondary | ICD-10-CM

## 2022-06-24 LAB — BASIC METABOLIC PANEL
Anion gap: 11 (ref 5–15)
BUN: 16 mg/dL (ref 8–23)
CO2: 23 mmol/L (ref 22–32)
Calcium: 8.5 mg/dL — ABNORMAL LOW (ref 8.9–10.3)
Chloride: 95 mmol/L — ABNORMAL LOW (ref 98–111)
Creatinine, Ser: 1.69 mg/dL — ABNORMAL HIGH (ref 0.61–1.24)
GFR, Estimated: 45 mL/min — ABNORMAL LOW (ref >60)
Glucose, Bld: 413 mg/dL — ABNORMAL HIGH (ref 70–99)
Potassium: 4.5 mmol/L (ref 3.5–5.1)
Sodium: 129 mmol/L — ABNORMAL LOW (ref 135–145)

## 2022-06-24 LAB — CBC
HCT: 43.9 % (ref 39.0–52.0)
Hemoglobin: 14.6 g/dL (ref 13.0–17.0)
MCH: 28 pg (ref 26.0–34.0)
MCHC: 33.3 g/dL (ref 30.0–36.0)
MCV: 84.1 fL (ref 80.0–100.0)
Platelets: 159 10*3/uL (ref 150–400)
RBC: 5.22 MIL/uL (ref 4.22–5.81)
RDW: 12.7 % (ref 11.5–15.5)
WBC: 8 10*3/uL (ref 4.0–10.5)
nRBC: 0 % (ref 0.0–0.2)

## 2022-06-24 LAB — GLUCOSE, CAPILLARY
Glucose-Capillary: 312 mg/dL — ABNORMAL HIGH (ref 70–99)
Glucose-Capillary: 340 mg/dL — ABNORMAL HIGH (ref 70–99)

## 2022-06-24 MED ORDER — INSULIN GLARGINE-YFGN 100 UNIT/ML ~~LOC~~ SOLN
52.0000 [IU] | Freq: Every day | SUBCUTANEOUS | Status: DC
  Filled 2022-06-24: qty 0.52

## 2022-06-24 MED ORDER — INSULIN ASPART 100 UNIT/ML IJ SOLN
0.0000 [IU] | Freq: Three times a day (TID) | INTRAMUSCULAR | Status: DC
  Administered 2022-06-24: 15 [IU] via SUBCUTANEOUS

## 2022-06-24 MED ORDER — ESOMEPRAZOLE MAGNESIUM 40 MG PO CPDR
40.0000 mg | DELAYED_RELEASE_CAPSULE | Freq: Two times a day (BID) | ORAL | 0 refills | Status: DC
  Filled 2022-06-24: qty 60, 30d supply, fill #0

## 2022-06-24 MED ORDER — SUCRALFATE 1 GM/10ML PO SUSP
ORAL | 0 refills | Status: DC
  Filled 2022-06-24 – 2022-06-25 (×2): qty 840, 28d supply, fill #0

## 2022-06-24 MED ORDER — APIXABAN 5 MG PO TABS
5.0000 mg | ORAL_TABLET | Freq: Two times a day (BID) | ORAL | 0 refills | Status: DC
  Filled 2022-06-24: qty 60, 30d supply, fill #0

## 2022-06-24 NOTE — Anesthesia Postprocedure Evaluation (Signed)
Anesthesia Post Note  Patient: Terry Parks  Procedure(s) Performed: ESOPHAGOGASTRODUODENOSCOPY (EGD) WITH PROPOFOL BIOPSY HEMOSTASIS CLIP PLACEMENT     Patient location during evaluation: Endoscopy Anesthesia Type: General Level of consciousness: awake and patient cooperative Pain management: pain level controlled Vital Signs Assessment: post-procedure vital signs reviewed and stable Respiratory status: spontaneous breathing, nonlabored ventilation and respiratory function stable Cardiovascular status: blood pressure returned to baseline and stable Postop Assessment: no apparent nausea or vomiting Anesthetic complications: no  No notable events documented.  Last Vitals:  Vitals:   06/24/22 0536 06/24/22 0740  BP: (!) 136/90 (!) 163/97  Pulse: 89 83  Resp: 17   Temp: 36.6 C 36.6 C  SpO2: 100% 95%    Last Pain:  Vitals:   06/24/22 0845  TempSrc:   PainSc: 0-No pain                 Laketa Sandoz

## 2022-06-24 NOTE — Inpatient Diabetes Management (Signed)
Inpatient Diabetes Program Recommendations  AACE/ADA: New Consensus Statement on Inpatient Glycemic Control (2015)  Target Ranges:  Prepandial:   less than 140 mg/dL      Peak postprandial:   less than 180 mg/dL (1-2 hours)      Critically ill patients:  140 - 180 mg/dL   Lab Results  Component Value Date   GLUCAP 340 (H) 06/24/2022   HGBA1C 12.7 (A) 04/27/2022    Review of Glycemic Control  Latest Reference Range & Units 06/23/22 15:13 06/23/22 20:59 06/24/22 07:35 06/24/22 11:50  Glucose-Capillary 70 - 99 mg/dL 110 (H) 382 (H) 312 (H) 340 (H)  (H): Data is abnormally high Diabetes history: Type 2 DM Outpatient Diabetes medications: Jardiance 25 mg QD, Lantus 52 units QHS, Ozempic 2 mg Qwk Current orders for Inpatient glycemic control: Semglee 52 units QHS, Novolog 0-20 units TID Decadron 5 mg x 1  Inpatient Diabetes Program Recommendations:    Noted increase in glucose trends and adjustment to insulin. Assuming related to decadron. In agreement with current plan of care.   Spoke with patient regarding outpatient diabetes management. Patient states, "I occasionally miss doses, my pharmacy sometimes does not have the medications when I go to pick them up and I have been told to increase number of glucose checks per day. I know what I need to do, just have to do it." Reviewed patient's current A1c of 12.7%. Explained what a A1c is and what it measures. Also reviewed goal A1c with patient, importance of good glucose control @ home, and blood sugar goals.  Reviewed patho of DM, need for improved control, survival skills, interventions, vascular changes and commorbidities.  Patient has supplies and meter to checking glucose. Reviewed recommended frequency, the importance behind more frequent checks, postprandial measurements and when to reach out to MD. Also, encouraged to follow up with pharmacy a week ahead of time so can have necessary medications.  Admits to drinking sugary beverages.  Reviewed alternatives and encourage plate method. Patient reports he will work on it.  Has no further questions at this time.   Thanks, Bronson Curb, MSN, RNC-OB Diabetes Coordinator [phone] (8a-5p)

## 2022-06-24 NOTE — Discharge Summary (Signed)
Name: Terry Parks MRN: [id number] DOB: [birth year] 67 y.o. PCP: [name], MD  Date of Admission: 06/22/2022  7:33 PM Date of Discharge: 06/24/2022 Attending Physician: Dr. Evette Doffing  Discharge Diagnosis: Principal Problem:   Hematemesis Active Problems:   Morbid obesity ([place])   Obstructive sleep apnea   Chronic combined systolic and diastolic CHF (congestive heart failure) (HCC)   Type 2 diabetes mellitus with peripheral neuropathy (HCC)   Longstanding persistent atrial fibrillation (HCC)   Gastroesophageal reflux disease with esophagitis without hemorrhage   Upper GI bleed   Chronic anticoagulation   Ulcer of esophagus with bleeding   Gastritis and gastroduodenitis    Discharge Medications: Allergies as of 06/24/2022       Reactions   Lisinopril Cough        Medication List     STOP taking these medications    amoxicillin 500 MG capsule Commonly known as: AMOXIL   Ozempic (2 MG/DOSE) 8 MG/3ML Sopn Generic drug: Semaglutide (2 MG/DOSE)   Rivaroxaban 15 MG Tabs tablet Commonly known as: XARELTO       TAKE these medications    Accu-Chek Guide test strip Generic drug: glucose blood USE TO check blood glucose THREE TIMES DAILY AS DIRECTED   Accu-Chek Guide w/Device Kit 1 Units by Does not apply route 3 (three) times daily.   Blood Glucose Monitor System w/Device Kit Use up to 4 (four) times daily as directed   Accu-Chek Softclix Lancets lancets Check blood sugar three times a day as instructed   amiodarone 200 MG tablet Commonly known as: PACERONE TAKE ONE TABLET BY MOUTH EVERY MORNING What changed: when to take this   amLODipine 5 MG tablet Commonly known as: NORVASC TAKE ONE TABLET BY MOUTH EVERY EVENING What changed: when to take this   apixaban 5 MG Tabs tablet Commonly known as: Eliquis Take 1 tablet (5 mg total) by mouth 2 (two) times daily. Start taking on: June 26, 2022   atorvastatin 40 MG tablet Commonly known as:  LIPITOR TAKE ONE TABLET BY MOUTH EVERY EVENING What changed: when to take this   carvedilol 25 MG tablet Commonly known as: COREG TAKE ONE TABLET BY MOUTH EVERY MORNING and TAKE ONE TABLET BY MOUTH EVERY EVENING What changed: See the new instructions.   Combivent Respimat 20-100 MCG/ACT Aers respimat Generic drug: Ipratropium-Albuterol Inhale 1 puff into the lungs every 6 (six) hours. What changed:  when to take this reasons to take this   Comfort EZ Pen Needles 32G X 6 MM Misc Generic drug: Insulin Pen Needle   Comfort EZ Pen Needles 31G X 6 MM Misc Generic drug: Insulin Pen Needle USE AS DIRECTED AT BEDTIME   DULoxetine 60 MG capsule Commonly known as: CYMBALTA Take 1 capsule (60 mg total) by mouth daily.   empagliflozin 25 MG Tabs tablet Commonly known as: JARDIANCE TAKE 1 TABLET (25 MG TOTAL) BY MOUTH DAILY BEFORE BREAKFAST. What changed:  how much to take how to take this when to take this   Entresto 97-103 MG Generic drug: sacubitril-valsartan TAKE ONE TABLET BY MOUTH EVERY MORNING and TAKE ONE TABLET BY MOUTH EVERY EVENING What changed: See the new instructions.   esomeprazole 40 MG capsule Commonly known as: NexIUM Take 1 capsule (40 mg total) by mouth 2 (two) times daily before a meal.   ezetimibe 10 MG tablet Commonly known as: ZETIA TAKE ONE TABLET BY MOUTH EVERY EVENING   Lantus SoloStar 100 UNIT/ML Solostar Pen Generic drug: insulin glargine Inject 52  Units into the skin daily.   spironolactone 50 MG tablet Commonly known as: ALDACTONE TAKE ONE TABLET BY MOUTH EVERY MORNING What changed: when to take this   sucralfate 1 GM/10ML suspension Commonly known as: CARAFATE Take 10 mLs (1 g total) by mouth 4 (four) times daily for 14 days, THEN 10 mLs (1 g total) 2 (two) times daily for 14 days. Start taking on: June 24, 2022   torsemide 20 MG tablet Commonly known as: DEMADEX TAKE TWO TABLETS BY MOUTH EVERY MORNING What changed: when to take  this        Disposition and follow-up:   Terry Parks was discharged from [facility] in Good condition.  At the hospital follow up visit please address:  1.  Follow-up:  a. Hematemesis/Esophagitis: assess for symptoms, adherence to Nexium and Carafate, has f/u with GI on 3/7     b. T2DM: stopped Ozempic concern for contributing to esophagitis, will need to adjust diabetes regimen   c. Afib: stopped Xarelto, will start Eliquis on 2/10 since less GI bleed risk  2.  Labs / imaging needed at time of follow-up: none  3.  Pending labs/ test needing follow-up: none  4.  Medication Changes -Eliquis: 5 mg twice a day (START on 06/26/22) -Nexium: 40 mg twice a day, f/u with GI and outpatient EGD -Carafate: 10 ml (1g) by mouth 4 times a day for 14 days, then twice a day for 14 days -STOPPED: Xarelto and Ozempic  Follow-up Appointments:  Follow-up Information     [name], MD. Go to.   Specialty: Internal Medicine Why: Appointment on: 07/01/2022 at 1:45 PM Contact information: [place] [id number] [phone]         Vladimir Crofts, PA-C. Go to.   Specialty: Gastroenterology Why: Appointment on: 07/22/2022 at 3:00 PM Contact information: [address] [address] [phone]                Hospital Course by problem list: Concern for Upper GI bleed Grade D esophagitis  Presented with 3 episodes of hematemesis at home. Hx of grade D esophagitis with last EGD on 06/09/21 showing erosive esophagitis without bleeding. Hemoglobin in normal range. Patient is on Xarelto. Denies NSAID use. GI performed EGD showing grade D reflux esophagitis, esophageal ulcer with stigmata of recent bleeding, clip placed, and mild non-ulcer gastritis. No further hematemesis since the initial 3 episodes. Advanced diet and tolerated well. Patient to continue with Nexium and Carafate. Follow with outpatient GI in March and repeat EGD in 6-8  weeks.   AKI on CKD 3b Creatinine on admission 1.94 with GFR 38. AKI improved after IV fluids.    T2DM Last A1c 12.7 in December. Home meds include glargine 52 units, semaglutide 2 mg, and empagliflozin 25 mg. Patient was initially started on semglee 30 units and SSI given NPO status then transition to liquid diet. CBGs were elevated after restarting diet, increased long acting to home dose. GI recommend stopping Ozempic as it could be contributing to his reflux and uncontrolled esophagitis.    Chronic pancreatitis  Lipase found to be 205. CT showed chronic calcific pancreatitis. Patient did not have abdominal pain. No gallstones seen on CT. Denies alcohol or tobacco use. Triglycerides normal last year. No acute concerns. Can continue outpatient follow up.    HFrEF HTN HLD Last echo showed EF 35-40%. Follows with outpatient cardiology. Did not present hypervolemic. BP was normotensive. Resumed his home medications.  Paroxysmal Afib  Hx of PAF. Patient on Xarelto and amiodarone and has been adherent. EKG showed sinus tachycardia. Held Xarelto in setting of hematemesis. Resumed home amiodarone. Patient will benefit with switching to Eliquis given studies showing less GI bleed risk compared to Xarelto. He will resume anticoagulation 3 days after EGD.    Discharge Subjective: Patient states he is feeling well. No further episodes of hematemesis. Tolerated full liquid diet and on soft foods today. States he is ready to go home.   Discharge Exam:   BP (!) 163/97 (BP Location: Right Arm)   Pulse 83   Temp 97.8 F (36.6 C) (Oral)   Resp 17   Ht 6' 1"$  (1.854 m)   Wt 127.1 kg   SpO2 95%   BMI 36.98 kg/m  Constitutional: well-appearing male lying in bed, in no acute distress HENT: normocephalic atraumatic Cardiovascular: regular rate Pulmonary/Chest: normal work of breathing on room air Abdominal: soft, non-tender, non-distended MSK: normal bulk and tone Neurological: alert & oriented x  3 Skin: warm and dry Psych: pleasant mood   Pertinent Labs, Studies, and Procedures:     Latest Ref Rng & Units 06/24/2022    3:27 AM 06/23/2022    4:51 AM 06/22/2022    7:02 PM  CBC  WBC 4.0 - 10.5 K/uL 8.0  7.3  8.8   Hemoglobin 13.0 - 17.0 g/dL 14.6  15.0  17.0   Hematocrit 39.0 - 52.0 % 43.9  45.9  50.8   Platelets 150 - 400 K/uL 159  149  192        Latest Ref Rng & Units 06/24/2022    3:27 AM 06/23/2022    9:41 AM 06/23/2022    4:51 AM  CMP  Glucose 70 - 99 mg/dL 413   148   BUN 8 - 23 mg/dL 16   19   Creatinine 0.61 - 1.24 mg/dL 1.69   1.81   Sodium 135 - 145 mmol/L 129   137   Potassium 3.5 - 5.1 mmol/L 4.5  3.7  3.4   Chloride 98 - 111 mmol/L 95   98   CO2 22 - 32 mmol/L 23   27   Calcium 8.9 - 10.3 mg/dL 8.5   8.6     CT ABDOMEN PELVIS WO CONTRAST  Result Date: 06/22/2022 CLINICAL DATA:  Acute abdominal pain and vomiting. Known ventral hernia. Occasional hematemesis. EXAM: CT ABDOMEN AND PELVIS WITHOUT CONTRAST TECHNIQUE: Multidetector CT imaging of the abdomen and pelvis was performed following the standard protocol without IV contrast. RADIATION DOSE REDUCTION: This exam was performed according to the departmental dose-optimization program which includes automated exposure control, adjustment of the mA and/or kV according to patient size and/or use of iterative reconstruction technique. COMPARISON:  CT with IV and oral contrast 05/27/2021, CT with IV contrast 02/02/2018. FINDINGS: Lower chest: The cardiac size is normal. There is a small hiatal hernia. Lung bases are clear. Hepatobiliary: The liver 16 cm length, mildly steatotic. No focal abnormality is seen without contrast. The gallbladder mildly distended but without wall thickening, calcified stones or biliary dilatation. Pancreas: Partially fatty atrophic, especially in the head, neck and uncinate process. There are a few calcifications in the pancreatic head consistent with chronic calcific pancreatitis. No focal abnormality  is seen without contrast, no inflammatory changes or ductal dilatation. Spleen: Unremarkable without contrast. No splenomegaly. Small splenule noted inferomedially. Adrenals/Urinary Tract: There is no adrenal mass. There is bilateral fetal lobation of the kidneys without contour deforming mass  identifiable without contrast. There is no urinary stone or obstruction. The bladder is contracted and not well seen but could be mildly thickened although is similar in appearance to the previous studies. Stomach/Bowel: Previous attempted ventral mesh hernia repair with recurrent broad-based wide mouth large ventral/umbilical hernia with mesh failure superolaterally, is again noted. There are multiple nonobstructed small bowel loops contained in the large hernia sac, with rectus diastasis. No small bowel obstruction or inflammation is seen. The gastric wall is unremarkable. The appendix extends medially from the cecum and is normal caliber. There is mild-to-moderate fecal stasis. There is diverticulosis without evidence of diverticulitis. Vascular/Lymphatic: Aortic atherosclerosis. No enlarged abdominal or pelvic lymph nodes. Reproductive: Prior TURP.  No recurrent prostatomegaly. Other: There is no free air, free hemorrhage, free fluid or incarcerated hernia. Small left inguinal fat hernia. Large ventral hernia described above. There are no acute inflammatory changes. Musculoskeletal: Mild degenerative change lumbar spine. Bilateral bridging osteophytes of the SI joints. No acute or new osseous findings. Mild hip DJD. Once again, L5-S1 disc bulge mildly compresses both S1 nerve roots. IMPRESSION: 1. No acute noncontrast CT findings. 2. Constipation and diverticulosis. 3. Large ventral/umbilical hernia containing multiple nonobstructed small bowel loops, in the setting of failed ventral mesh hernia repair. Small hiatal hernia. 4. Aortic atherosclerosis. 5. Chronic calcific pancreatitis. 6. Chronic bladder thickening which  could be due to nondistention, cystitis or chronic outlet obstruction. Prior TURP. 7. Small left inguinal fat hernia. 8. L5-S1 disc bulge mildly compresses both S1 nerve roots. Aortic Atherosclerosis (ICD10-I70.0). Electronically Signed   By: Telford Nab M.D.   On: 06/22/2022 21:19   DG Chest Portable 1 View  Result Date: 06/22/2022 CLINICAL DATA:  Hematemesis EXAM: PORTABLE CHEST 1 VIEW COMPARISON:  08/10/2019 FINDINGS: Heart size upper limits of normal. Mediastinal shadows are normal. The lungs are clear. No edema. No infiltrate, collapse or effusion. No abnormal bone finding. IMPRESSION: No active disease. Electronically Signed   By: Nelson Chimes M.D.   On: 06/22/2022 21:04     Discharge Instructions: Discharge Instructions     Call MD for:  difficulty breathing, headache or visual disturbances   Complete by: As directed    Call MD for:  extreme fatigue   Complete by: As directed    Call MD for:  hives   Complete by: As directed    Call MD for:  persistant dizziness or light-headedness   Complete by: As directed    Call MD for:  persistant nausea and vomiting   Complete by: As directed    Call MD for:  redness, tenderness, or signs of infection (pain, swelling, redness, odor or green/yellow discharge around incision site)   Complete by: As directed    Call MD for:  severe uncontrolled pain   Complete by: As directed    Call MD for:  temperature >100.4   Complete by: As directed    Diet - low sodium heart healthy   Complete by: As directed    Increase activity slowly   Complete by: As directed        Signed: [name], DO 06/24/2022, 2:57 PM   Pager: [phone]

## 2022-06-24 NOTE — Discharge Instructions (Addendum)
You were hospitalized for episodes of blood in your vomit. Our GI specialists want you to take Nexium and Carafate and will see you in their office on 07/22/22. We will switch your blood thinner to Eliquis twice a day, start taking it on 2/10. Please stop your Xarelto. Please stop your Ozempic. Thank you for allowing [place] to be part of your care.   We arranged for you to follow up at:  [facility]: 07/22/22 at 3 pm [facility]: 2/15 at 1:45 pm  Please note these changes made to your medications:  *Please START taking:  -Eliquis: 5 mg twice a day (START on 06/26/22) -Nexium: 40 mg twice a day  -Carafate: 10 ml (1g) by mouth 4 times a day for 14 days, then twice a day for 14 days  *Please STOP taking:  --Ozempic --Xarelto  Please make sure to follow up with GI and your primary care doctor.   Please call our clinic if you have any questions or concerns, we may be able to help and keep you from a long and expensive emergency room wait. Our clinic and after hours phone number is [phone], the best time to call is Monday through Friday 9 am to 4 pm but there is always someone available 24/7 if you have an emergency. If you need medication refills please notify your pharmacy one week in advance and they will send [place] a request.

## 2022-06-24 NOTE — Telephone Encounter (Signed)
The pt has been scheduled for appt with Vicie Mutters on 07/22/22 at 3 pm. Pt to be advised at discharge.

## 2022-06-25 ENCOUNTER — Other Ambulatory Visit (HOSPITAL_COMMUNITY): Payer: Self-pay

## 2022-06-25 LAB — SURGICAL PATHOLOGY

## 2022-06-26 ENCOUNTER — Encounter (HOSPITAL_COMMUNITY): Payer: Self-pay | Admitting: Gastroenterology

## 2022-06-29 ENCOUNTER — Other Ambulatory Visit (HOSPITAL_COMMUNITY): Payer: Self-pay

## 2022-07-08 ENCOUNTER — Ambulatory Visit: Payer: Medicaid Other | Admitting: Dietician

## 2022-07-08 ENCOUNTER — Other Ambulatory Visit: Payer: Self-pay

## 2022-07-08 ENCOUNTER — Ambulatory Visit (INDEPENDENT_AMBULATORY_CARE_PROVIDER_SITE_OTHER): Payer: Medicaid Other

## 2022-07-08 VITALS — BP 136/85 | HR 88 | Temp 97.6°F | Resp 28 | Ht 73.0 in | Wt 282.0 lb

## 2022-07-08 DIAGNOSIS — K2211 Ulcer of esophagus with bleeding: Secondary | ICD-10-CM | POA: Diagnosis not present

## 2022-07-08 DIAGNOSIS — E559 Vitamin D deficiency, unspecified: Secondary | ICD-10-CM

## 2022-07-08 DIAGNOSIS — E1142 Type 2 diabetes mellitus with diabetic polyneuropathy: Secondary | ICD-10-CM

## 2022-07-08 DIAGNOSIS — Z7984 Long term (current) use of oral hypoglycemic drugs: Secondary | ICD-10-CM

## 2022-07-08 DIAGNOSIS — Z794 Long term (current) use of insulin: Secondary | ICD-10-CM | POA: Diagnosis not present

## 2022-07-08 DIAGNOSIS — E871 Hypo-osmolality and hyponatremia: Secondary | ICD-10-CM

## 2022-07-08 DIAGNOSIS — E119 Type 2 diabetes mellitus without complications: Secondary | ICD-10-CM | POA: Diagnosis not present

## 2022-07-08 DIAGNOSIS — I10 Essential (primary) hypertension: Secondary | ICD-10-CM | POA: Diagnosis not present

## 2022-07-08 DIAGNOSIS — R32 Unspecified urinary incontinence: Secondary | ICD-10-CM | POA: Diagnosis not present

## 2022-07-08 NOTE — Progress Notes (Deleted)
Hospital D/c 2/6 for hematemesis in setting of esophagitis  HTN Amlodipine 5  Combined HF Patient with increased lower extremity swelling during last office visit. Advised to double his torsemide for 3 days. Per patient, his lower extremity swelling has resolved. He denies any shortness of breath or further leg swelling since resuming regular torsemide dose. Weight is down 22 lbs since last office visit 2 weeks ago. No evidence of heart failure exacerbation on exam.   Plan: -Continue torsemide 20 mg daily -Continue Entresto 97-103 mg BID -Continue Jardiance 25 mg daily -Continue spironolactone 50 mg daily -Continue Coreg 25 mg BID daily  Persistent Afib Xarelto discontinued at hospital d/c 2/6 for hematemesis, apixiban '5mg'$  BID started Amiodarone 200  HLD LDL 75 09/2021 Lipitor '40mg'$  daily Ezetemibe 10  T2DM c/b neuropathy and retinopathy Plan: A1c 04/27/22 12.7, microalbumin 96 01/2022 -Continue Lantus 52 units daily -Continue Ozempic 2 mg weekly=> discontinued, contributing to esophagitis?? -Continue Jardiance 25 mg daily -janumet??? -duloxetine 60 daily -Continue lifestyle modifications with exercise and dietary changes -Follow-up in 4 weeks -follows optho  CKD 3 BMP 2/08 eGFR 45  Vit D deficiency 8.8 10/2020  Hematemasis? Esophagitis Reflux esophagitis with stigmata of bleeding ulcer Sucralfate esomeprazole  Chronic pancreatitis

## 2022-07-08 NOTE — Progress Notes (Signed)
Documentation for Freestyle Libre Pro Continuous glucose monitoring per Dr. Stann Mainland request Freestyle Libre Pro CGM sensor placed today. Patient was educated about wearing sensor, keeping food, activity and medication log and when to call office. Patient was educated about how to care for the sensor and not to have an MRI, CT or Diathermy while wearing the sensor. Follow up was arranged with the patient for 2 weeks per Dr. Stann Mainland.   Lot #: [id number] Serial #: [id number] Expiration Date: 11/14/22  Terry Parks, RD 07/08/2022 10:25 AM.

## 2022-07-08 NOTE — Patient Instructions (Signed)
Thank you, [name] for allowing [place] to provide your care today. Today we discussed :    Diabetes: We will place a professional glucometer today. This will record your sugars for 2 weeks and help [place] make changes.Please come back in two weeks for a reading.  Vitamin D deficiency: I recommend getting a vitamin D level.  Low sodium: You had low sodium in the hospital. I recommend getting a lab draw to evaluate this.   Referrals ordered today:    Referral Orders         Ambulatory referral to diabetic education          Follow up:  2 weeks      We look forward to seeing you next time. Please call our clinic at [phone] if you have any questions or concerns. The best time to call is Monday-Friday from 9am-4pm, but there is someone available 24/7. If after hours or the weekend, call the main hospital number and ask for the Internal Medicine Resident On-Call. If you need medication refills, please notify your pharmacy one week in advance and they will send [place] a request.   Thank you for trusting me with your care. Wishing you the best!   Iona Coach, MD [place]

## 2022-07-12 DIAGNOSIS — E871 Hypo-osmolality and hyponatremia: Secondary | ICD-10-CM | POA: Insufficient documentation

## 2022-07-12 NOTE — Assessment & Plan Note (Signed)
Patient discharged from the hospital 2/6 for hematemesis in the setting of esophagitis, her for follow up. Denies lightheadedness, dizziness, nausea, vomiting/hematemesis, hematochezia, melena, dysphagia, esophageal pain, GERD since discharge. No bleeding episodes during hospitalization and none since discharge, no evidence of slow GI bleed. No cbc at this time given stable blood counts at hospital discharge. Continue Sucralfate,esomeprazole.

## 2022-07-12 NOTE — Assessment & Plan Note (Signed)
Noted to have sodium of 129 at hospital discharge recently, may have been in the setting of poor oral intake given esophagitis. Recommended BMP today to reevaluate sodium, patient declining at this time.

## 2022-07-12 NOTE — Assessment & Plan Note (Signed)
Patient noted to have Vitamin D deficiency to 8.8 in 2022. Discussed repeating vitamin D labs today, patient declining at this time. Does not want to take vitamin D supplement at this time.

## 2022-07-12 NOTE — Assessment & Plan Note (Signed)
Most recent A1c 12.7 04/2022. Patient did not bring his mete in today but says he is seeing BG in the 200-300s. At hospital discharge 2/6 his ozempic was discontinued. This seems to be in the setting of his esophagitis. I am unsure the connection, question if delayed gastric emptying and worsening GERD/esophagitis is the concern? 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 Patient has not tolerated metformin in the past and says he will not try this medication again. Running out of oral options, glipizide is the only real option remaining. I do worry we are nearing the point of requiring meal time insulin which has been touched on in prior office visit notes. Professional CGM placed. -F/u 2 weeks for CGM reading -Continue Lantus 52 units daily -Continue Jardiance 25 mg daily

## 2022-07-12 NOTE — Progress Notes (Signed)
Established Patient Office Visit  Subjective   Patient ID: Terry Parks, male    DOB: [birth year]  Age: 67 y.o. MRN: [id number]  Chief Complaint  Patient presents with   Follow-up    HFU    Terry Parks is a 67 y/o male with a pmh outlined below. Please see A&P for HPI information.      Review of Systems  All other systems reviewed and are negative.     Objective:     BP 136/85 (BP Location: Right Arm, Patient Position: Sitting, Cuff Size: Large)   Pulse 88   Temp 97.6 F (36.4 C) (Oral)   Resp (!) 28   Ht '6\' 1"'$  (1.854 m)   Wt 282 lb (127.9 kg)   SpO2 100% Comment: room air  BMI 37.21 kg/m    Physical Exam Constitutional:      General: He is not in acute distress.    Appearance: Normal appearance. He is obese.  Eyes:     General: No scleral icterus.    Conjunctiva/sclera: Conjunctivae normal.  Cardiovascular:     Rate and Rhythm: Normal rate.     Pulses: Normal pulses.     Heart sounds: Normal heart sounds. No murmur heard.    No gallop.  Pulmonary:     Effort: Pulmonary effort is normal. No respiratory distress.     Breath sounds: Normal breath sounds. No wheezing or rales.  Musculoskeletal:     Right lower leg: No edema.     Left lower leg: No edema.  Skin:    General: Skin is warm and dry.     Capillary Refill: Capillary refill takes less than 2 seconds.  Neurological:     Mental Status: He is alert.      No results found for any visits on 07/08/22.    The ASCVD Risk score (Arnett DK, et al., 2019) failed to calculate for the following reasons:   The valid total cholesterol range is 130 to 320 mg/dL    Assessment & Plan:   Problem List Items Addressed This Visit       Digestive   Ulcer of esophagus with bleeding    Patient discharged from the hospital 2/6 for hematemesis in the setting of esophagitis, her for follow up. Denies lightheadedness, dizziness, nausea, vomiting/hematemesis, hematochezia, melena, dysphagia, esophageal  pain, GERD since discharge. No bleeding episodes during hospitalization and none since discharge, no evidence of slow GI bleed. No cbc at this time given stable blood counts at hospital discharge. Continue Sucralfate,esomeprazole.        Endocrine   Type 2 diabetes mellitus with peripheral neuropathy (HCC) - Primary (Chronic)    Most recent A1c 12.7 04/2022. Patient did not bring his mete in today but says he is seeing BG in the 200-300s. At hospital discharge 2/6 his ozempic was discontinued. This seems to be in the setting of his esophagitis. I am unsure the connection, question if delayed gastric emptying and worsening GERD/esophagitis is the concern? 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 Patient has not tolerated metformin in the past and says he will not try this medication again. Running out of oral options, glipizide is the only real option remaining. I do worry we are nearing the point of requiring meal time insulin which has been touched on in prior office visit notes. Professional CGM placed. -F/u 2 weeks for  CGM reading -Continue Lantus 52 units daily -Continue Jardiance 25 mg daily       Relevant Orders   Ambulatory referral to diabetic education     Other   Vitamin D deficiency (Chronic)    Patient noted to have Vitamin D deficiency to 8.8 in 2022. Discussed repeating vitamin D labs today, patient declining at this time. Does not want to take vitamin D supplement at this time.      Hyponatremia    Noted to have sodium of 129 at hospital discharge recently, may have been in the setting of poor oral intake given esophagitis. Recommended BMP today to reevaluate sodium, patient declining at this time.       Return in about 2 weeks (around 07/22/2022).    Iona Coach, MD

## 2022-07-14 NOTE — Addendum Note (Signed)
Addended by: Charise Killian on: 07/14/2022 03:30 PM   Modules accepted: Level of Service

## 2022-07-14 NOTE — Progress Notes (Signed)
Internal Medicine Clinic Attending  Case discussed with Dr. Stann Mainland  At the time of the visit.  We reviewed the resident's history and exam and pertinent patient test results.  I agree with the assessment, diagnosis, and plan of care documented in the resident's note.

## 2022-07-15 ENCOUNTER — Encounter: Payer: Self-pay | Admitting: Obstetrics and Gynecology

## 2022-07-15 ENCOUNTER — Other Ambulatory Visit: Payer: Medicaid Other | Admitting: Obstetrics and Gynecology

## 2022-07-15 NOTE — Patient Instructions (Signed)
Hey [name], I am glad you are feeling better, have a nice day!  Terry Parks was given information about Medicaid Managed Care team care coordination services as a part of their [place] Medicaid benefit. Terry Parks verbally consented to engagement with the [facility] Managed Care team.   If you are experiencing a medical emergency, please call 911 or report to your local emergency department or urgent care.   If you have a non-emergency medical problem during routine business hours, please contact your provider's office and ask to speak with a nurse.   For questions related to your [facility], please call: [phone] or visit the homepage here: [email or web address]  If you would like to schedule transportation through your [facility], please call the following number at least 2 days in advance of your appointment: [phone]   Rides for urgent appointments can also be made after hours by calling Member Services.  Call the [place] at [phone], at any time, 24 hours a day, 7 days a week. If you are in danger or need immediate medical attention call 911.  If you would like help to quit smoking, call 1-800-QUIT-NOW (530)876-5241) OR Espaol: 1-855-Djelo-Ya HD:1601594) o para ms informacin haga clic aqu or Text READY to 200-400 to register via text  Terry Parks - following are the goals we discussed in your visit today:   Goals Addressed             This Visit's Progress    Manage My Medicine       Timeframe:  Short-Term Goal Priority:  High Start Date:                             Expected End Date:                       Follow Up Date:  08/12/22   - call for medicine refill 2 or 3 days before it runs out - call if I am sick and can't take my medicine - keep a list of all the medicines I take; vitamins and  herbals too - use a pillbox to sort medicine    Why is this important?   These steps will help you keep on track with your medicines.   07/15/22:  patient states he is taking his medications    Monitor and Manage My Blood Sugar-Diabetes Type 2       Timeframe:  Long-Range Goal Priority:  High Start Date:        06/03/20                     Expected End Date:   ongoing               Follow Up Date:08/12/22   - check blood sugar at prescribed times - check blood sugar if I feel it is too high or too low - enter blood sugar readings and medication or insulin into my phone log - take the blood sugar log to all doctor visits - take the blood sugar meter to all doctor visits    Why is this important?   Checking your blood sugar at home helps to keep it from getting very high or very low.  Writing the results in a diary or log helps the doctor know how to care for you.  Your  blood sugar log should have the time, date and the results.  Also, write down the amount of insulin or other medicine that you take.  Other information, like what you ate, exercise done and how you were feeling, will also be helpful.     Notes:-  07/15/22: Patient states blood sugar-150-175-has f/u appt with PCP 3/11    Protect My Health       Timeframe:  Long-Range Goal Priority:  Medium Start Date:      10/07/20                       Expected End Date:  ongoing                Follow Up Date:08/12/22   - schedule appointment for flu shot - schedule appointment for vaccines needed due to my age or health - schedule recommended health tests. - schedule and keep appointment for annual check-up   Why is this important?   Screening tests can find diseases early when they are easier to treat.  Your doctor or nurse will talk with you about which tests are important for you.  Getting shots for common diseases like the flu and shingles will help prevent them.   07/15/22:  patient has appt with PCP and nutritionist 3/11     Track and Manage Fluids and Swelling-Heart Failure       Timeframe:  Long-Range Goal Priority:  High Start Date:          01/14/20                   Expected End Date:   ongoing     Follow Up Date: 08/12/22   - call office if I gain more than 2 pounds in one day or 5 pounds in one week - do ankle pumps when sitting - use salt in moderation - watch for swelling in feet, ankles and legs every day - weigh myself daily  - log weight in my phone   Why is this important?   It is important to check your weight daily and watch how much salt and liquids you have.  It will help you to manage your heart failure.    Notes: 07/15/22:  No swelling per patient    Track and Manage My Blood Pressure-Hypertension       Timeframe:  Long-Range Goal Priority:  High Start Date:     06/03/20                        Expected End Date:  ongoing                 Follow Up Date: 08/12/22  - check blood pressure daily - write blood pressure results in my phone log    Why is this important?   You won't feel high blood pressure, but it can still hurt your blood vessels.  High blood pressure can cause heart or kidney problems. It can also cause a stroke.  Making lifestyle changes like losing a little weight or eating less salt will help.  Checking your blood pressure at home and at different times of the day can help to control blood pressure.  If the doctor prescribes medicine remember to take it the way the doctor ordered.  Call the office if you cannot afford the medicine or if there are questions about it 08/12/22:  Has appt with PCP 3/11-BP WNL  per patient   Patient verbalizes understanding of instructions and care plan provided today and agrees to view in [place]. Active MyChart status and patient understanding of how to access instructions and care plan via MyChart confirmed with patient.     The Managed Medicaid care management team will reach out to the patient again over the next 30 business  days.   The  Patient has been provided with contact information for the Managed Medicaid care management team and has been advised to call with any health related questions or concerns.   [name] RN, BSN [place] Management Coordinator - Managed Medicaid High Risk [phone]   Following is a copy of your plan of care:  Care Plan : CCM RN- Diabetes Type 2 (Adult)  Updates made by [name], RN since 07/15/2022 12:00 AM     Problem: Glycemic Management (Diabetes, Type 2)   Priority: High  Onset Date: 06/03/2020     Long-Range Goal: Glycemic Management Optimized   Start Date: 07/20/2019  Expected End Date: 09/14/2022  Recent Progress: Not on track  Priority: High  Note:    CARE PLAN ENTRY (see longitudinal plan of care for additional care plan information)  Current Barriers:  Knowledge Deficits related to basic Diabetes pathophysiology and self care/management Knowledge Deficits related to medications used for management of diabetes 07/15/22: Patient states he is taking all medications-checks blood sugar in morning per patient-150-175.  Has PCP f/u 3/11 Case Manager Clinical Goal(s):  Over the next 30-60  days, patient will demonstrate improved adherence to prescribed treatment plan for diabetes self care/management as evidenced by:  daily monitoring and recording of CBG  adherence to ADA/ carb modified diet adherence to prescribed medication regimen  Interventions:  Reviewed education with patient about basic DM disease process Reviewed medications and assessed medication taking behavior Ensured patient has ample supply of all prescribed medications Advised patient, providing education and rationale, to check CBG at least daily and record, calling provider and/or CCM RN for findings outside established parameters. Educated patient that leg and foot pain will likely decrease if blood sugar control is improved and a CGM could provide that  assistance  Discussed plans with patient for ongoing care management follow up and provided patient with direct contact information for care management team Review of patient status, including review of consultants reports, relevant laboratory and other test results, and medications completed. Collaborated with [facility] for insulin. 04/16/22:  Patient provided with [facility] phone number.  Patient Self Care Activities:  UNABLE to independently self manage DM Self administers oral medications as prescribed Self administers insulin as prescribed Self administers injectable DM medication Xultophy as prescribed Attends all scheduled provider appointments Checks blood sugars as prescribed and utilize hyper and hypoglycemia protocol as needed Adheres to prescribed ADA/carb modified check blood sugar at prescribed times - check blood sugar if I feel it is too high or too low - enter blood sugar readings and medication or insulin into my phone log - take the blood sugar log to all doctor visits - take the blood sugar meter to all doctor visits     Care Plan : CCM RN- Chronic Pain (Adult)- lower extremeties  Updates made by [name], RN since 07/15/2022 12:00 AM     Problem: Chronic Pain Management (Chronic Pain)   Priority: High  Onset Date: 07/15/2020     Long-Range Goal: Chronic Pain Managed   Start Date: 07/15/2020  Expected End Date: 09/14/2022  Recent Progress: On track  Priority: High  Note:   Current Barriers:  Knowledge Deficits related to self-health management of chronic lower extremity pain Chronic Disease Management support and education needs related to chronic pain 07/15/22:  Chronic leg and foot pain, managed with meds Clinical Goal(s):  patient will verbalize understanding of plan for pain management.  and patient will use pharmacological and nonpharmacological pain relief strategies as prescribed.  Interventions:  Pain assessment performed Medications  reviewed Discussed plans with patient for ongoing care management follow up and provided patient with direct contact information for care management team Discussed improvement  in DM control will likely result in less pain in legs and feet  Patient Goals/Self Care Activities:  Will self-administer medications as prescribed Will attend all scheduled provider appointments Will call pharmacy for medication refills 7 days prior to needed refill date Patient will calls provider office for new concerns or questions Patient will get appt scheduled with general surgeon Follow Up Plan: The care management team will reach out to the patient again over the next 30-60 days.    Care Plan : General Plan of Care (Adult)  Updates made by [name], RN since 07/15/2022 12:00 AM     Problem: Health Promotion or Disease Self-Management (General Plan of Care)   Priority: Medium  Onset Date: 10/07/2020     Long-Range Goal: Self-Management Plan Developed   Start Date: 10/07/2020  Expected End Date: 09/14/2022  Recent Progress: On track  Priority: Medium  Note:   Current Barriers:  Ineffective Self Health Maintenance Currently UNABLE TO independently self manage needs related to chronic health conditions, HTN, DM, HF, OSA, vision loss, PAF, HLD, CKD, chronic pain Knowledge Deficits related to short term plan for care coordination needs and long term plans for chronic disease management needs 07/15/22:  patient hospitalized 2/6-2/8 for esophagitis-no complaints today.  States he has all meds and taking.  Denies any swelling today Nurse Case Manager Clinical Goal(s):  patient will work with care management team to address care coordination and chronic disease management needs related to Disease Management Educational Needs Care Coordination Medication Management and Education Medication Reconciliation Medication Assistance  Psychosocial Support    Heart Failure Interventions:  (Status:  New goal.)  Long Term Goal Provided education on low sodium diet Assessed need for readable accurate scales in home Provided education about placing scale on hard, flat surface Advised patient to weigh each morning after emptying bladder Discussed importance of daily weight and advised patient to weigh and record daily Reviewed role of diuretics in prevention of fluid overload and management of heart failure; Discussed the importance of keeping all appointments with provider Assessed social determinant of health barriers   Hyperlipidemia Interventions:  (Status:  New goal.) Long Term Goal Medication review performed; medication list updated in electronic medical record.  Provider established cholesterol goals reviewed Counseled on importance of regular laboratory monitoring as prescribed Discussed strategies to manage statin-induced myalgias Reviewed importance of limiting foods high in cholesterol Reviewed exercise goals and target of 150 minutes per week Assessed social determinant of health barriers   Hypertension Interventions:  (Status:  New goal.) Long Term Goal Last practice recorded BP readings:  BP Readings from Last 3 Encounters:  07/08/22 136/85  06/24/22 (!) 163/97  05/13/22 124/79   Most recent eGFR/CrCl:  Lab Results  Component Value Date   EGFR 65 04/27/2022    No components found for: "CRCL"  Evaluation of current treatment plan related to hypertension self management and  patient's adherence to plan as established by provider Reviewed medications with patient and discussed importance of compliance Counseled on the importance of exercise goals with target of 150 minutes per week Discussed plans with patient for ongoing care management follow up and provided patient with direct contact information for care management team Advised patient, providing education and rationale, to monitor blood pressure daily and record, calling PCP for findings outside established  parameters Reviewed scheduled/upcoming provider appointments including:  Discussed complications of poorly controlled blood pressure such as heart disease, stroke, circulatory complications, vision complications, kidney impairment, sexual dysfunction Assessed social determinant of health barriers  Interventions:  Evaluation of current treatment plan and patient's adherence to plan as established by provider. Reviewed medications with patient. Collaborated with pharmacy regarding referrals. Collaborated with PCP for referrals-completed Collaborated with Care Guide for eyeglass provider that accepts patient's insurance-completed Care Guide referral for eyeglass provider-completed Discussed plans with patient for ongoing care management follow up and provided patient with direct contact information for care management team Advised patient, providing education and rationale, to monitor blood pressure daily and record, calling provider for findings outside established parameters.  Reviewed scheduled/upcoming provider appointments. Collaborated with PCP for general surgeon and podiatry referral-completed Advised patient, providing education and rationale, to check cbg and record, calling provider for findings outside established parameters.   Pharmacy referral for medication review. Advised patient to contact insurance company regarding resumption of aide service-completed Care Guide referral for first time home buyer resources-completed Collaborated with Care Guide for first time home buyer resources Collaborated with [facility] Self Care Activities:  Patient will self administer medications as prescribed Patient will attend all scheduled provider appointments Patient will call pharmacy for medication refills Patient will continue to perform ADL's independently Patient will call provider office for new concerns or questions Patient Goals:. In the next 30 days, patient will attend all  appointments. In the next 30 days, patient will follow up on meter. In the next 30 days, patient will call insurance company and surgeon to schedule an appt. - Follow Up Plan: The patient has been provided with contact information for the care management team and has been advised to call with any health related questions or concerns.  The care management team will reach out to the patient again over the next 30 business  days.

## 2022-07-15 NOTE — Patient Outreach (Signed)
Medicaid Managed Care   Nurse Care Manager Note  07/15/2022 Name:  Terry Parks MRN:  [id number] DOB:  11-Feb-1956  Terry Parks is an 67 y.o. year old male who is a primary patient of Terry Beard, MD.  The [facility] Managed Care Coordination team was consulted for assistance with:    Chronic healthcare management needs, HTN, DM, HF, OSA, vision loss, PAF, CKD, chronic pain  [name] was given information about Medicaid Managed Care Coordination team services today. Terry Parks Patient agreed to services and verbal consent obtained.  Engaged with patient by telephone for follow up visit in response to provider referral for case management and/or care coordination services.   Assessments/Interventions:  Review of past medical history, allergies, medications, health status, including review of consultants reports, laboratory and other test data, was performed as part of comprehensive evaluation and provision of chronic care management services.  SDOH (Social Determinants of Health) assessments and interventions performed: SDOH Interventions    Flowsheet Row Patient Outreach Telephone from 07/15/2022 in [place] Office Visit from 07/08/2022 in [place] Patient Outreach Telephone from 06/15/2022 in [place] Office Visit from 04/27/2022 in [place] Patient Outreach Telephone from 04/16/2022 in [place] Office Visit from 01/27/2022 in [place]  SDOH Interventions        Food Insecurity Interventions -- -- -- Intervention Not Indicated -- --  Housing Interventions -- -- Intervention Not Indicated -- -- --  Utilities Interventions -- -- -- -- Intervention Not Indicated --  Alcohol Usage Interventions -- -- Intervention Not Indicated (Score <7) -- -- --  Depression Interventions/Treatment  -- Counseling, Patient  refuses Treatment -- -- -- Counseling, Patient refuses Treatment  Financial Strain Interventions Intervention Not Indicated -- -- -- -- --  Stress Interventions Intervention Not Indicated -- -- -- -- --  Social Connections Interventions -- -- -- Intervention Not Indicated -- --     Care Plan  Allergies  Allergen Reactions   Lisinopril Cough    Medications Reviewed Today     Reviewed by [name], RN (Registered Nurse) on 07/15/22 at 1107  Med List Status: <None>   Medication Order Taking? Sig Documenting Provider Last Dose Status Informant  Accu-Chek Softclix Lancets lancets [id number]  Check blood sugar three times a day as instructed Lacinda Axon, MD  Active Self  amiodarone (PACERONE) 200 MG tablet [id number] No TAKE ONE TABLET BY MOUTH EVERY MORNING  Patient taking differently: Take 200 mg by mouth daily.   Parks, [name], MD 06/20/2022 Active Self  amLODipine (NORVASC) 5 MG tablet [id number] No TAKE ONE TABLET BY MOUTH EVERY EVENING  Patient taking differently: Take 5 mg by mouth daily.   Parks, [name], MD 06/20/2022 Active Self  apixaban (ELIQUIS) 5 MG TABS tablet [id number]  Take 1 tablet (5 mg total) by mouth 2 (two) times daily. Terry Blonder, DO  Active   atorvastatin (LIPITOR) 40 MG tablet [id number] No TAKE ONE TABLET BY MOUTH EVERY EVENING  Patient taking differently: Take 40 mg by mouth daily.   Parks, [name], MD 06/20/2022 Active Self  Blood Glucose Monitoring Suppl (ACCU-CHEK GUIDE) w/Device KIT [id number] No 1 Units by Does not apply route 3 (three) times daily. Terry Beard, MD Taking Active Self  Blood Glucose Monitoring Suppl (BLOOD GLUCOSE MONITOR SYSTEM) w/Device KIT [id number]  Use up to 4 (four) times daily as directed [name], MD  Active  Self  carvedilol (COREG) 25 MG tablet [id number] No TAKE ONE TABLET BY MOUTH EVERY MORNING and TAKE ONE TABLET BY MOUTH EVERY EVENING  Patient taking differently: Take 25 mg by mouth 2 (two) times daily with  a meal.   Parks, [name], MD 06/20/2022 Active Self  COMFORT EZ PEN NEEDLES 31G X 6 MM MISC [id number] No USE AS DIRECTED AT BEDTIME Terry Beard, MD Taking Active Self  COMFORT EZ PEN NEEDLES 32G X 6 MM MISC [id number] No  [provider] Taking Active Self  DULoxetine (CYMBALTA) 60 MG capsule [id number] No Take 1 capsule (60 mg total) by mouth daily. Terry Beard, MD 06/20/2022 Active Self  empagliflozin (JARDIANCE) 25 MG TABS tablet [id number] No TAKE 1 TABLET (25 MG TOTAL) BY MOUTH DAILY BEFORE BREAKFAST.  Patient taking differently: Take 25 mg by mouth daily.   Terry Beard, MD 06/20/2022 Active Self  esomeprazole (NEXIUM) 40 MG capsule [id number]  Take 1 capsule (40 mg total) by mouth 2 (two) times daily before a meal. Terry Blonder, DO  Active   ezetimibe (ZETIA) 10 MG tablet [id number] No TAKE ONE TABLET BY MOUTH EVERY EVENING Parks, [name], MD 06/20/2022 Active Self  glucose blood (ACCU-CHEK GUIDE) test strip [id number]  USE TO check blood glucose THREE TIMES DAILY AS DIRECTED Lacinda Axon, MD  Active Self  insulin glargine (LANTUS SOLOSTAR) 100 UNIT/ML Solostar Pen [id number] No Inject 52 Units into the skin daily. Lacinda Axon, MD 06/20/2022 Active Self  Ipratropium-Albuterol (COMBIVENT RESPIMAT) 20-100 MCG/ACT AERS respimat [id number] No Inhale 1 puff into the lungs every 6 (six) hours.  Patient taking differently: Inhale 1 puff into the lungs every 6 (six) hours as needed for shortness of breath.   [name], MD 06/20/2022 Active Self  sacubitril-valsartan (ENTRESTO) 97-103 MG [id number] No TAKE ONE TABLET BY MOUTH EVERY MORNING and TAKE ONE TABLET BY MOUTH EVERY EVENING  Patient taking differently: Take 1 tablet by mouth 2 (two) times daily.   Parks, [name], MD 06/20/2022 Active Self  spironolactone (ALDACTONE) 50 MG tablet [id number] No TAKE ONE TABLET BY MOUTH EVERY MORNING  Patient taking differently: Take 50 mg by mouth daily.   Parks, [name], MD 06/20/2022  Active Self  sucralfate (CARAFATE) 1 GM/10ML suspension [id number]  Take 10 mLs (1 g total) by mouth 4 (four) times daily for 14 days, THEN 10 mLs (1 g total) 2 (two) times daily for 14 days. Terry Blonder, DO  Active   torsemide (DEMADEX) 20 MG tablet [id number] No TAKE TWO TABLETS BY MOUTH EVERY MORNING  Patient taking differently: Take 40 mg by mouth daily.   Terry Klein, MD 06/20/2022 Active Self           Patient Active Problem List   Diagnosis Date Noted   Hyponatremia 07/12/2022   Upper GI bleed 06/23/2022   Chronic anticoagulation 06/23/2022   Ulcer of esophagus with bleeding 06/23/2022   Gastritis and gastroduodenitis 06/23/2022   Hematemesis 06/22/2022   Recurrent abdominal hernia without obstruction or gangrene 05/22/2021   Pyrosis 05/22/2021   Indigestion 123456   Periumbilical pain 123456   Abdominal pain, epigastric 05/22/2021   Class 3 obesity ([place]) 11/10/2020   S/P TURP 11/10/2020   History of iron deficiency anemia 12/16/2019   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   Depression 01/31/2019   Gastroesophageal reflux disease with esophagitis without hemorrhage 11/25/2018   Iron deficiency anemia 04/28/2018   CKD stage 3 due to type 2 diabetes mellitus ([place]) 02/11/2018   Healthcare maintenance  02/11/2018   Longstanding persistent atrial fibrillation ([place])    Vitamin D deficiency 06/22/2016   Allergic rhinitis 10/19/2012   Diabetic retinopathy ([place]) 10/04/2012   Type 2 diabetes mellitus with peripheral neuropathy ([place]) 04/08/2008   Obstructive sleep apnea    Morbid obesity ([place])    Hyperlipidemia associated with type 2 diabetes mellitus ([place])    Hypertension associated with diabetes ([place])    Chronic combined systolic and diastolic CHF (congestive heart failure) ([place]) 03/01/2006   Conditions to be addressed/monitored per PCP order:  Chronic healthcare management needs, HTN, DM, HF, OSA, vision loss, PAF, CKD, chronic pain  Care Plan : CCM RN- Diabetes  Type 2 (Adult)  Updates made by [name], RN since 07/15/2022 12:00 AM     Problem: Glycemic Management (Diabetes, Type 2)   Priority: High  Onset Date: 06/03/2020     Long-Range Goal: Glycemic Management Optimized   Start Date: 07/20/2019  Expected End Date: 09/14/2022  Recent Progress: Not on track  Priority: High  Note:    CARE PLAN ENTRY (see longitudinal plan of care for additional care plan information)  Current Barriers:  Knowledge Deficits related to basic Diabetes pathophysiology and self care/management Knowledge Deficits related to medications used for management of diabetes 07/15/22: Patient states he is taking all medications-checks blood sugar in morning per patient-150-175.  Has PCP f/u 3/11 Case Manager Clinical Goal(s):  Over the next 30-60  days, patient will demonstrate improved adherence to prescribed treatment plan for diabetes self care/management as evidenced by:  daily monitoring and recording of CBG  adherence to ADA/ carb modified diet adherence to prescribed medication regimen  Interventions:  Reviewed education with patient about basic DM disease process Reviewed medications and assessed medication taking behavior Ensured patient has ample supply of all prescribed medications Advised patient, providing education and rationale, to check CBG at least daily and record, calling provider and/or CCM RN for findings outside established parameters. Educated patient that leg and foot pain will likely decrease if blood sugar control is improved and a CGM could provide that assistance  Discussed plans with patient for ongoing care management follow up and provided patient with direct contact information for care management team Review of patient status, including review of consultants reports, relevant laboratory and other test results, and medications completed. Collaborated with [facility] for insulin. 04/16/22:  Patient provided with [facility] phone  number.  Patient Self Care Activities:  UNABLE to independently self manage DM Self administers oral medications as prescribed Self administers insulin as prescribed Self administers injectable DM medication Xultophy as prescribed Attends all scheduled provider appointments Checks blood sugars as prescribed and utilize hyper and hypoglycemia protocol as needed Adheres to prescribed ADA/carb modified check blood sugar at prescribed times - check blood sugar if I feel it is too high or too low - enter blood sugar readings and medication or insulin into my phone log - take the blood sugar log to all doctor visits - take the blood sugar meter to all doctor visits    Care Plan : CCM RN- Chronic Pain (Adult)- lower extremeties  Updates made by [name], RN since 07/15/2022 12:00 AM     Problem: Chronic Pain Management (Chronic Pain)   Priority: High  Onset Date: 07/15/2020     Long-Range Goal: Chronic Pain Managed   Start Date: 07/15/2020  Expected End Date: 09/14/2022  Recent Progress: On track  Priority: High  Note:   Current Barriers:  Knowledge Deficits related to self-health management  of chronic lower extremity pain Chronic Disease Management support and education needs related to chronic pain 07/15/22:  Chronic leg and foot pain, managed with meds Clinical Goal(s):  patient will verbalize understanding of plan for pain management.  and patient will use pharmacological and nonpharmacological pain relief strategies as prescribed.  Interventions:  Pain assessment performed Medications reviewed Discussed plans with patient for ongoing care management follow up and provided patient with direct contact information for care management team Discussed improvement  in DM control will likely result in less pain in legs and feet  Patient Goals/Self Care Activities:  Will self-administer medications as prescribed Will attend all scheduled provider appointments Will call pharmacy for  medication refills 7 days prior to needed refill date Patient will calls provider office for new concerns or questions Patient will get appt scheduled with general surgeon Follow Up Plan: The care management team will reach out to the patient again over the next 30-60 days.       Care Plan : General Plan of Care (Adult)  Updates made by [name], RN since 07/15/2022 12:00 AM     Problem: Health Promotion or Disease Self-Management (General Plan of Care)   Priority: Medium  Onset Date: 10/07/2020     Long-Range Goal: Self-Management Plan Developed   Start Date: 10/07/2020  Expected End Date: 09/14/2022  Recent Progress: On track  Priority: Medium  Note:   Current Barriers:  Ineffective Self Health Maintenance Currently UNABLE TO independently self manage needs related to chronic health conditions, HTN, DM, HF, OSA, vision loss, PAF, HLD, CKD, chronic pain Knowledge Deficits related to short term plan for care coordination needs and long term plans for chronic disease management needs 07/15/22:  patient hospitalized 2/6-2/8 for esophagitis-no complaints today.  States he has all meds and taking.  Denies any swelling today Nurse Case Manager Clinical Goal(s):  patient will work with care management team to address care coordination and chronic disease management needs related to Disease Management Educational Needs Care Coordination Medication Management and Education Medication Reconciliation Medication Assistance  Psychosocial Support    Heart Failure Interventions:  (Status:  New goal.) Long Term Goal Provided education on low sodium diet Assessed need for readable accurate scales in home Provided education about placing scale on hard, flat surface Advised patient to weigh each morning after emptying bladder Discussed importance of daily weight and advised patient to weigh and record daily Reviewed role of diuretics in prevention of fluid overload and management of heart  failure; Discussed the importance of keeping all appointments with provider Assessed social determinant of health barriers   Hyperlipidemia Interventions:  (Status:  New goal.) Long Term Goal Medication review performed; medication list updated in electronic medical record.  Provider established cholesterol goals reviewed Counseled on importance of regular laboratory monitoring as prescribed Discussed strategies to manage statin-induced myalgias Reviewed importance of limiting foods high in cholesterol Reviewed exercise goals and target of 150 minutes per week Assessed social determinant of health barriers   Hypertension Interventions:  (Status:  New goal.) Long Term Goal Last practice recorded BP readings:  BP Readings from Last 3 Encounters:  07/08/22 136/85  06/24/22 (!) 163/97  05/13/22 124/79   Most recent eGFR/CrCl:  Lab Results  Component Value Date   EGFR 65 04/27/2022    No components found for: "CRCL"  Evaluation of current treatment plan related to hypertension self management and patient's adherence to plan as established by provider Reviewed medications with patient and discussed importance of compliance  Counseled on the importance of exercise goals with target of 150 minutes per week Discussed plans with patient for ongoing care management follow up and provided patient with direct contact information for care management team Advised patient, providing education and rationale, to monitor blood pressure daily and record, calling PCP for findings outside established parameters Reviewed scheduled/upcoming provider appointments including:  Discussed complications of poorly controlled blood pressure such as heart disease, stroke, circulatory complications, vision complications, kidney impairment, sexual dysfunction Assessed social determinant of health barriers  Interventions:  Evaluation of current treatment plan and patient's adherence to plan as established by  provider. Reviewed medications with patient. Collaborated with pharmacy regarding referrals. Collaborated with PCP for referrals-completed Collaborated with Care Guide for eyeglass provider that accepts patient's insurance-completed Care Guide referral for eyeglass provider-completed Discussed plans with patient for ongoing care management follow up and provided patient with direct contact information for care management team Advised patient, providing education and rationale, to monitor blood pressure daily and record, calling provider for findings outside established parameters.  Reviewed scheduled/upcoming provider appointments. Collaborated with PCP for general surgeon and podiatry referral-completed Advised patient, providing education and rationale, to check cbg and record, calling provider for findings outside established parameters.   Pharmacy referral for medication review. Advised patient to contact insurance company regarding resumption of aide service-completed Care Guide referral for first time home buyer resources-completed Collaborated with Care Guide for first time home buyer resources Collaborated with [facility] Self Care Activities:  Patient will self administer medications as prescribed Patient will attend all scheduled provider appointments Patient will call pharmacy for medication refills Patient will continue to perform ADL's independently Patient will call provider office for new concerns or questions Patient Goals:. In the next 30 days, patient will attend all appointments. In the next 30 days, patient will follow up on meter. In the next 30 days, patient will call insurance company and surgeon to schedule an appt. - Follow Up Plan: The patient has been provided with contact information for the care management team and has been advised to call with any health related questions or concerns.  The care management team will reach out to the patient again over the next  30 business  days.     Follow Up:  Patient agrees to Care Plan and Follow-up.  Plan: The Managed Medicaid care management team will reach out to the patient again over the next 30 business  days. and The  Patient has been provided with contact information for the Managed Medicaid care management team and has been advised to call with any health related questions or concerns.  Date/time of next scheduled RN care management/care coordination outreach:  08/12/22 at 1230

## 2022-07-21 NOTE — Progress Notes (Deleted)
07/21/2022 Terry Parks [id number] 12-23-1955  Referring provider: [name], MD Primary GI doctor: Dr. Rush Landmark  ASSESSMENT AND PLAN:   There are no diagnoses linked to this encounter.  Patient Care Team: [name], MD as PCP - General [name], [name], MD as PCP - Cardiology (Cardiology) Duke, Tami Lin, [place] as Physician Assistant (Cardiology) Jola Schmidt, MD as Consulting Physician (Ophthalmology) Craft, Lorel Monaco, RN as Case Manager  HISTORY OF PRESENT ILLNESS: 67 y.o. male with a past medical history of Afib on xarelto, hypertension, hyperlipidemia, OSA, diabetes, CHF EF 35-40%, prostate cancer status post prostatectomy, GERD with grade D esophagitis, hemorrhoids, prior IDA  and others listed below presents for evaluation of post hospital follow up for hematemesis and gastric ulcer.   01/11/2019 colonoscopy for IDA- Hemorrhoids found on digital rectal exam. - Stool in the entire examined colon. Lavage allowed adequate visualization. - Normal mucosa in the entire examined colon. - Non-bleeding non-thrombosed internal hemorrhoids.  06/09/2021 EGD Dr. Rush Landmark for IDA, coffee-ground emesis, epigastric pain showed LA grade D esophagitis, 3 cm hiatal hernia, normal stomach and duodenum.  Pathology showed acute erosive esophagitis negative for fungal organisms, viral, dysplasia.  This was have repeat endoscopy 4 months however this was never done.  06/23/2022 hospital for hematemesis EGD with Dr. Bryan Lemma LA grade D esophagitis esophageal ulcer with stigmata of recent bleeding MR placed, mild nonulcer gastritis, normal duodenum.  Recheck EGD 6 to 8 weeks, consider stopping Ozempic. Patient on Nexium twice daily for 8 weeks, can go back down to once daily as long as no esophagitis symptoms.  He  reports that he has never smoked. He has never used smokeless tobacco. He reports that he does not drink alcohol and does not use drugs.  RELEVANT LABS AND IMAGING: CBC     Component Value Date/Time   WBC 8.0 06/24/2022 0327   RBC 5.22 06/24/2022 0327   HGB 14.6 06/24/2022 0327   HGB 13.0 11/10/2018 1123   HCT 43.9 06/24/2022 0327   HCT 39.9 11/10/2018 1123   PLT 159 06/24/2022 0327   PLT 207 11/10/2018 1123   MCV 84.1 06/24/2022 0327   MCV 85 11/10/2018 1123   MCH 28.0 06/24/2022 0327   MCHC 33.3 06/24/2022 0327   RDW 12.7 06/24/2022 0327   RDW 14.6 11/10/2018 1123   LYMPHSABS 2.0 04/23/2019 1534   LYMPHSABS 1.4 02/08/2018 1054   MONOABS 0.7 04/23/2019 1534   EOSABS 0.1 04/23/2019 1534   EOSABS 0.1 02/08/2018 1054   BASOSABS 0.0 04/23/2019 1534   BASOSABS 0.0 02/08/2018 1054   Recent Labs    06/22/22 1902 06/23/22 0451 06/24/22 0327  HGB 17.0 15.0 14.6     CMP     Component Value Date/Time   NA 129 (L) 06/24/2022 0327   NA 140 04/27/2022 1021   K 4.5 06/24/2022 0327   CL 95 (L) 06/24/2022 0327   CO2 23 06/24/2022 0327   GLUCOSE 413 (H) 06/24/2022 0327   BUN 16 06/24/2022 0327   BUN 9 04/27/2022 1021   CREATININE 1.69 (H) 06/24/2022 0327   CREATININE 1.08 07/18/2014 1056   CALCIUM 8.5 (L) 06/24/2022 0327   PROT 8.4 (H) 06/22/2022 1902   PROT 7.5 09/30/2021 1100   ALBUMIN 4.3 06/22/2022 1902   ALBUMIN 4.1 09/30/2021 1100   AST 13 (L) 06/22/2022 1902   ALT 12 06/22/2022 1902   ALKPHOS 93 06/22/2022 1902   BILITOT 0.8 06/22/2022 1902   BILITOT 0.5 09/30/2021 1100   GFRNONAA 45 (  L) 06/24/2022 0327   GFRNONAA 76 07/18/2014 1056   GFRAA 66 04/01/2020 1138   GFRAA 88 07/18/2014 1056      Latest Ref Rng & Units 06/22/2022    7:02 PM 09/30/2021   11:00 AM 05/20/2021    4:12 PM  Hepatic Function  Total Protein 6.5 - 8.1 g/dL 8.4  7.5  7.3   Albumin 3.5 - 5.0 g/dL 4.3  4.1  3.8   AST 15 - 41 U/L '13  16  11   '$ ALT 0 - 44 U/L '12  14  12   '$ Alk Phosphatase 38 - 126 U/L 93  87  84   Total Bilirubin 0.3 - 1.2 mg/dL 0.8  0.5  0.5       Current Medications:   Current Outpatient Medications (Endocrine & Metabolic):    empagliflozin  (JARDIANCE) 25 MG TABS tablet, TAKE 1 TABLET (25 MG TOTAL) BY MOUTH DAILY BEFORE BREAKFAST. (Patient taking differently: Take 25 mg by mouth daily.)   insulin glargine (LANTUS SOLOSTAR) 100 UNIT/ML Solostar Pen, Inject 52 Units into the skin daily.  Current Outpatient Medications (Cardiovascular):    amiodarone (PACERONE) 200 MG tablet, TAKE ONE TABLET BY MOUTH EVERY MORNING (Patient taking differently: Take 200 mg by mouth daily.)   amLODipine (NORVASC) 5 MG tablet, TAKE ONE TABLET BY MOUTH EVERY EVENING (Patient taking differently: Take 5 mg by mouth daily.)   atorvastatin (LIPITOR) 40 MG tablet, TAKE ONE TABLET BY MOUTH EVERY EVENING (Patient taking differently: Take 40 mg by mouth daily.)   carvedilol (COREG) 25 MG tablet, TAKE ONE TABLET BY MOUTH EVERY MORNING and TAKE ONE TABLET BY MOUTH EVERY EVENING (Patient taking differently: Take 25 mg by mouth 2 (two) times daily with a meal.)   ezetimibe (ZETIA) 10 MG tablet, TAKE ONE TABLET BY MOUTH EVERY EVENING   sacubitril-valsartan (ENTRESTO) 97-103 MG, TAKE ONE TABLET BY MOUTH EVERY MORNING and TAKE ONE TABLET BY MOUTH EVERY EVENING (Patient taking differently: Take 1 tablet by mouth 2 (two) times daily.)   spironolactone (ALDACTONE) 50 MG tablet, TAKE ONE TABLET BY MOUTH EVERY MORNING (Patient taking differently: Take 50 mg by mouth daily.)   torsemide (DEMADEX) 20 MG tablet, TAKE TWO TABLETS BY MOUTH EVERY MORNING (Patient taking differently: Take 40 mg by mouth daily.)  Current Outpatient Medications (Respiratory):    Ipratropium-Albuterol (COMBIVENT RESPIMAT) 20-100 MCG/ACT AERS respimat, Inhale 1 puff into the lungs every 6 (six) hours. (Patient taking differently: Inhale 1 puff into the lungs every 6 (six) hours as needed for shortness of breath.)   Current Outpatient Medications (Hematological):    apixaban (ELIQUIS) 5 MG TABS tablet, Take 1 tablet (5 mg total) by mouth 2 (two) times daily.  Current Outpatient Medications (Other):     Accu-Chek Softclix Lancets lancets, Check blood sugar three times a day as instructed   Blood Glucose Monitoring Suppl (ACCU-CHEK GUIDE) w/Device KIT, 1 Units by Does not apply route 3 (three) times daily.   Blood Glucose Monitoring Suppl (BLOOD GLUCOSE MONITOR SYSTEM) w/Device KIT, Use up to 4 (four) times daily as directed   COMFORT EZ PEN NEEDLES 31G X 6 MM MISC, USE AS DIRECTED AT BEDTIME   COMFORT EZ PEN NEEDLES 32G X 6 MM MISC,    DULoxetine (CYMBALTA) 60 MG capsule, Take 1 capsule (60 mg total) by mouth daily.   esomeprazole (NEXIUM) 40 MG capsule, Take 1 capsule (40 mg total) by mouth 2 (two) times daily before a meal.   glucose blood (ACCU-CHEK GUIDE)  test strip, USE TO check blood glucose THREE TIMES DAILY AS DIRECTED   sucralfate (CARAFATE) 1 GM/10ML suspension, Take 10 mLs (1 g total) by mouth 4 (four) times daily for 14 days, THEN 10 mLs (1 g total) 2 (two) times daily for 14 days.  Medical History:  Past Medical History:  Diagnosis Date   Abscessed tooth    top back large cavity no pain or drainage, one on bottom  pt pulled tooth 4-5 months ago, right top large hole in tooth   AKI (acute kidney injury) ([facility] [place] [facility])    Allergic rhinitis    Anemia    Anxiety    Asthma    Atrial fibrillation (HCC)    BPH (benign prostatic hypertrophy)    Massive BPH noted on cystoscopy 1/23/ 2012 by Dr. Risa Grill.   Cancer [facility])    prostate cancer 2019   Cardiomyopathy [facility])    CHF (congestive heart failure) ([place])    Cough 03/30/2012   Depression    Diabetes mellitus 04/08/2008   type 2   Dyspnea    Dysrhythmia 2019   Foley catheter in place 07-05-17 placed   Fracture, orbital ([place]) 2021   Right   GERD (gastroesophageal reflux disease) 2020   Headache(784.0)    hx migraines none recent   History of esophagitis 12/16/2019   Hyperlipemia    Hypertension    Hypertensive cardiopathy 03/01/2006   2-D echocardiogram 02/01/2012 showed moderate LVH, mildly to moderately reduced left ventricular  systolic function with an estimated ejection fraction of 40-45%, and diffuse hypokinesis.  A nuclear medicine stress study done 01/31/2012 showed no reversible ischemia, a small mid anterior wall fixed defect/infarct, and ejection fraction 42%.       Neck pain    Nephrolithiasis 05/29/2010   CT scan of abdomen/pelvis on 05/29/2010 showed an obstructing approximate 1-2 mm calculus at the left UVJ, and an approximate 1-2 mm left lower pole renal calculus.   Patient had continuing severe pain , and an elevation of his serum creatinine to a value of 1.75 on 06/06/2010.  Patient underwent cystoscopy on 06/08/2010 by Dr. Risa Grill, but attempts at retrograde pyelogram and ureteroscopy were unsucc   Numbness 01/08/2018   Obstructive sleep apnea 03/06/2008   Sleep study 03/06/08 showed severe OSA/hypopnea syndrome, with successful CPAP titration to 13 CWP using a medium ResMed Mirage Quattro full face mask with heated humidifier.    Rash 04/17/2014   Renal calculus 05/29/2010   CT scan of abdomen/pelvis on 05/29/2010 showed an obstructing approximate 1-2 mm calculus at the left UVJ, and an approximate 1-2 mm left lower pole renal calculus.   Patient had continuing severe pain , and an elevation of his serum creatinine to a value of 1.75 on 06/06/2010.  The stone had apparently passed and was not seen on repeat CT 06/08/2010.   Sleep apnea    haven't use cpap in 2 years   Tooth pain 10/29/2017   Urinary straining 11/02/2016   Allergies:  Allergies  Allergen Reactions   Lisinopril Cough     Surgical History:  He  has a past surgical history that includes Cystoscopy w/ retrogrades; Cardioversion (N/A, 06/08/2017); big toe nails removed (Bilateral, 20 yrs ago); Transurethral resection of bladder tumor (N/A, 07/13/2017); Robot assisted laparoscopic radical prostatectomy (N/A, 10/06/2017); Lymphadenectomy (Bilateral, 10/06/2017); IR Radiologist Eval & Mgmt (02/02/2018); Colonoscopy; Fracture surgery (Right, 2021); Incisional  hernia repair (N/A, 03/24/2020); Esophagogastroduodenoscopy (egd) with propofol (N/A, 06/23/2022); biopsy (06/23/2022); and Hemostasis clip placement (06/23/2022). Family History:  His  family history includes Breast cancer in his mother; Diabetes in his maternal grandmother and mother; Hypertension in his father; Stroke in his father; Ulcerative colitis in his mother.  REVIEW OF SYSTEMS  : All other systems reviewed and negative except where noted in the History of Present Illness.  PHYSICAL EXAM: There were no vitals taken for this visit. General Appearance: Well nourished, in no apparent distress. Head:   Normocephalic and atraumatic. Eyes:  sclerae anicteric,conjunctive pink  Respiratory: Respiratory effort normal, BS equal bilaterally without rales, rhonchi, wheezing. Cardio: RRR with no MRGs. Peripheral pulses intact.  Abdomen: Soft,  {BlankSingle:19197::"Flat","Obese","Non-distended"} ,active bowel sounds. {actendernessAB:27319} tenderness {anatomy; site abdomen:5010}. {BlankMultiple:19196::"Without guarding","With guarding","Without rebound","With rebound"}. No masses. Rectal: {acrectalexam:27461} Musculoskeletal: Full ROM, {PSY - GAIT AND STATION:22860} gait. {With/Without:304960234} edema. Skin:  Dry and intact without significant lesions or rashes Neuro: Alert and  oriented x4;  No focal deficits. Psych:  Cooperative. Normal mood and affect.    Vladimir Crofts, PA-C 2:23 PM

## 2022-07-22 ENCOUNTER — Encounter: Payer: Medicaid Other | Admitting: Dietician

## 2022-07-22 ENCOUNTER — Ambulatory Visit: Payer: Medicaid Other | Admitting: Physician Assistant

## 2022-07-26 ENCOUNTER — Encounter: Payer: Medicaid Other | Admitting: Dietician

## 2022-07-26 ENCOUNTER — Other Ambulatory Visit: Payer: Self-pay

## 2022-07-26 ENCOUNTER — Encounter: Payer: Self-pay | Admitting: Student

## 2022-07-26 ENCOUNTER — Ambulatory Visit (INDEPENDENT_AMBULATORY_CARE_PROVIDER_SITE_OTHER): Payer: Medicaid Other | Admitting: Student

## 2022-07-26 VITALS — BP 128/84 | HR 77 | Temp 97.5°F | Resp 32 | Ht 73.0 in | Wt 294.6 lb

## 2022-07-26 DIAGNOSIS — E1142 Type 2 diabetes mellitus with diabetic polyneuropathy: Secondary | ICD-10-CM

## 2022-07-26 DIAGNOSIS — Z794 Long term (current) use of insulin: Secondary | ICD-10-CM

## 2022-07-26 NOTE — Progress Notes (Signed)
Patient unaware he had an appointment today.  He brought CGM to be read only today.  Will discuss this with diabetic coordinator.  He is unfortunately unable to stay for his appointment and will reschedule at a better time.

## 2022-07-27 NOTE — Progress Notes (Signed)
Professional Continuous glucose monitor sensor was brought in by patient because it fell off. It was downloaded and the 13 days report will be shared with Dr. Lisabeth Devoid.  Debera Lat, RD 07/27/2022 9:14 AM.

## 2022-08-12 ENCOUNTER — Other Ambulatory Visit: Payer: Self-pay | Admitting: Student

## 2022-08-12 ENCOUNTER — Other Ambulatory Visit: Payer: Self-pay

## 2022-08-12 ENCOUNTER — Other Ambulatory Visit: Payer: Medicaid Other | Admitting: Obstetrics and Gynecology

## 2022-08-12 ENCOUNTER — Encounter: Payer: Self-pay | Admitting: Obstetrics and Gynecology

## 2022-08-12 DIAGNOSIS — I4819 Other persistent atrial fibrillation: Secondary | ICD-10-CM

## 2022-08-12 NOTE — Patient Outreach (Signed)
Medicaid Managed Care   Nurse Care Manager Note  08/12/2022 Name:  Benhamin Damboise MRN:  [id number] DOB:  1955/06/10  Terry Parks is an 67 y.o. year old male who is a primary patient of [name], MD.  [facility] Managed Care Coordination team was consulted for assistance with:    Chronic healthcare management needs, HTN, DM, HF, OSA, vison loss, PAF, CKD, chronic pain, OSA, BPH  Mr. Vandongen was given information about Medicaid Managed Care Coordination team services today. [name] Patient agreed to services and verbal consent obtained.  Engaged with patient by telephone for follow up visit in response to provider referral for case management and/or care coordination services.   Assessments/Interventions:  Review of past medical history, allergies, medications, health status, including review of consultants reports, laboratory and other test data, was performed as part of comprehensive evaluation and provision of chronic care management services.  SDOH (Social Determinants of Health) assessments and interventions performed: SDOH Interventions    Flowsheet Row Patient Outreach Telephone from 08/12/2022 in [place] Office Visit from 07/26/2022 in [place] Patient Outreach Telephone from 07/15/2022 in [place] Office Visit from 07/08/2022 in [place] Patient Outreach Telephone from 06/15/2022 in [place] Office Visit from 04/27/2022 in [place]  SDOH Interventions        Food Insecurity Interventions -- -- -- -- -- Intervention Not Indicated  Housing Interventions -- -- -- -- Intervention Not Indicated --  Alcohol Usage Interventions -- -- -- -- Intervention Not Indicated (Score <7) --  Depression Interventions/Treatment  -- Medication  [stopped counseling] -- Counseling, Patient refuses Treatment --  --  Financial Strain Interventions -- -- Intervention Not Indicated -- -- --  Physical Activity Interventions Intervention Not Indicated -- -- -- -- --  Stress Interventions -- -- Intervention Not Indicated -- -- --  Social Connections Interventions -- -- -- -- -- Intervention Not Indicated     Care Plan  Allergies  Allergen Reactions   Lisinopril Cough   Medications Reviewed Today     Reviewed by [name], RN (Registered Nurse) on 08/12/22 at 1243  Med List Status: <None>   Medication Order Taking? Sig Documenting Provider Last Dose Status Informant  Accu-Chek Softclix Lancets lancets [id number]  Check blood sugar three times a day as instructed Lacinda Axon, MD  Active Self  amiodarone (PACERONE) 200 MG tablet [id number] No TAKE ONE TABLET BY MOUTH EVERY MORNING  Patient taking differently: Take 200 mg by mouth daily.   [name], [name], MD 06/20/2022 Active Self  amLODipine (NORVASC) 5 MG tablet [id number] No TAKE ONE TABLET BY MOUTH EVERY EVENING  Patient taking differently: Take 5 mg by mouth daily.   [name], [name], MD 06/20/2022 Active Self  apixaban (ELIQUIS) 5 MG TABS tablet [id number]  Take 1 tablet (5 mg total) by mouth 2 (two) times daily. [name], DO  Active   atorvastatin (LIPITOR) 40 MG tablet [id number] No TAKE ONE TABLET BY MOUTH EVERY EVENING  Patient taking differently: Take 40 mg by mouth daily.   [name], [name], MD 06/20/2022 Active Self  Blood Glucose Monitoring Suppl (ACCU-CHEK GUIDE) w/Device KIT [id number] No 1 Units by Does not apply route 3 (three) times daily. [name], MD Taking Active Self  Blood Glucose Monitoring Suppl (BLOOD GLUCOSE MONITOR SYSTEM) w/Device KIT [id number]  Use up to 4 (four) times daily as directed [name], MD  Active Self  carvedilol (COREG) 25 MG tablet [id number] No TAKE ONE TABLET BY MOUTH EVERY MORNING and TAKE ONE TABLET BY MOUTH EVERY EVENING  Patient taking differently: Take 25 mg by mouth 2 (two) times  daily with a meal.   [name], [name], MD 06/20/2022 Active Self  COMFORT EZ PEN NEEDLES 31G X 6 MM MISC [id number] No USE AS DIRECTED AT BEDTIME [name], MD Taking Active Self  COMFORT EZ PEN NEEDLES 32G X 6 MM MISC [id number] No  [provider] Taking Active Self  DULoxetine (CYMBALTA) 60 MG capsule [id number] No Take 1 capsule (60 mg total) by mouth daily. [name], MD 06/20/2022 Active Self  empagliflozin (JARDIANCE) 25 MG TABS tablet [id number] No TAKE 1 TABLET (25 MG TOTAL) BY MOUTH DAILY BEFORE BREAKFAST.  Patient taking differently: Take 25 mg by mouth daily.   [name], MD 06/20/2022 Active Self  esomeprazole (NEXIUM) 40 MG capsule [id number]  Take 1 capsule (40 mg total) by mouth 2 (two) times daily before a meal. [name], DO  Active   ezetimibe (ZETIA) 10 MG tablet [id number] No TAKE ONE TABLET BY MOUTH EVERY EVENING [name], [name], MD 06/20/2022 Active Self  glucose blood (ACCU-CHEK GUIDE) test strip [id number]  USE TO check blood glucose THREE TIMES DAILY AS DIRECTED Lacinda Axon, MD  Active Self  insulin glargine (LANTUS SOLOSTAR) 100 UNIT/ML Solostar Pen [id number] No Inject 52 Units into the skin daily. Lacinda Axon, MD 06/20/2022 Active Self  Ipratropium-Albuterol (COMBIVENT RESPIMAT) 20-100 MCG/ACT AERS respimat [id number] No Inhale 1 puff into the lungs every 6 (six) hours.  Patient taking differently: Inhale 1 puff into the lungs every 6 (six) hours as needed for shortness of breath.   [name], MD 06/20/2022 Active Self  sacubitril-valsartan (ENTRESTO) 97-103 MG [id number] No TAKE ONE TABLET BY MOUTH EVERY MORNING and TAKE ONE TABLET BY MOUTH EVERY EVENING  Patient taking differently: Take 1 tablet by mouth 2 (two) times daily.   [name], [name], MD 06/20/2022 Active Self  spironolactone (ALDACTONE) 50 MG tablet [id number] No TAKE ONE TABLET BY MOUTH EVERY MORNING  Patient taking differently: Take 50 mg by mouth daily.   [name], [name], MD  06/20/2022 Active Self  sucralfate (CARAFATE) 1 GM/10ML suspension [id number]  Take 10 mLs (1 g total) by mouth 4 (four) times daily for 14 days, THEN 10 mLs (1 g total) 2 (two) times daily for 14 days. [name], DO  Expired 07/27/22 2359   torsemide (DEMADEX) 20 MG tablet [id number] No TAKE TWO TABLETS BY MOUTH EVERY MORNING  Patient taking differently: Take 40 mg by mouth daily.   [name], MD 06/20/2022 Active Self           Patient Active Problem List   Diagnosis Date Noted   Hyponatremia 07/12/2022   Upper GI bleed 06/23/2022   Chronic anticoagulation 06/23/2022   Ulcer of esophagus with bleeding 06/23/2022   Gastritis and gastroduodenitis 06/23/2022   Hematemesis 06/22/2022   Recurrent abdominal hernia without obstruction or gangrene 05/22/2021   Pyrosis 05/22/2021   Indigestion 123456   Periumbilical pain 123456   Abdominal pain, epigastric 05/22/2021   Class 3 obesity ([place]) 11/10/2020   S/P TURP 11/10/2020   History of iron deficiency anemia 12/16/2019   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   Depression 01/31/2019   Gastroesophageal reflux disease with esophagitis without hemorrhage 11/25/2018   Iron deficiency anemia 04/28/2018   CKD stage 3 due to type 2 diabetes mellitus ([place]) 02/11/2018  Healthcare maintenance 02/11/2018   Longstanding persistent atrial fibrillation ([facility])    Vitamin D deficiency 06/22/2016   Allergic rhinitis 10/19/2012   Diabetic retinopathy ([place]) 10/04/2012   Type 2 diabetes mellitus with peripheral neuropathy ([place]) 04/08/2008   Obstructive sleep apnea    Morbid obesity ([place])    Hyperlipidemia associated with type 2 diabetes mellitus ([place])    Hypertension associated with diabetes ([place])    Chronic combined systolic and diastolic CHF (congestive heart failure) ([place]) 03/01/2006   Conditions to be addressed/monitored per PCP order:  Chronic healthcare management needs, HTN, DM, HF, OSA, vison loss, PAF, CKD, chronic pain, OSA,  BPH  Care Plan : CCM RN- Diabetes Type 2 (Adult)  Updates made by [name], RN since 08/12/2022 12:00 AM     Problem: Glycemic Management (Diabetes, Type 2)   Priority: High  Onset Date: 06/03/2020     Long-Range Goal: Glycemic Management Optimized   Start Date: 07/20/2019  Expected End Date: 11/08/2022  Recent Progress: Not on track  Priority: High  Note:    CARE PLAN ENTRY (see longitudinal plan of care for additional care plan information)  Current Barriers:  Knowledge Deficits related to basic Diabetes pathophysiology and self care/management Knowledge Deficits related to medications used for management of diabetes 23/28/24: Patient states he is taking all medications-checks blood sugar in morning per patient-low 200s.   Has PCP f/u 4/16.  States he has not been eating correctly. Case Manager Clinical Goal(s):  Over the next 30-60  days, patient will demonstrate improved adherence to prescribed treatment plan for diabetes self care/management as evidenced by:  daily monitoring and recording of CBG  adherence to ADA/ carb modified diet adherence to prescribed medication regimen  Interventions:  Reviewed education with patient about basic DM disease process Reviewed medications and assessed medication taking behavior Ensured patient has ample supply of all prescribed medications Advised patient, providing education and rationale, to check CBG at least daily and record, calling provider and/or CCM RN for findings outside established parameters. Educated patient that leg and foot pain will likely decrease if blood sugar control is improved and a CGM could provide that assistance  Discussed plans with patient for ongoing care management follow up and provided patient with direct contact information for care management team Review of patient status, including review of consultants reports, relevant laboratory and other test results, and medications completed. Collaborated with  [facility] for insulin. 04/16/22:  Patient provided with [facility] phone number.  Patient Self Care Activities:  UNABLE to independently self manage DM Self administers oral medications as prescribed Self administers insulin as prescribed Self administers injectable DM medication Xultophy as prescribed Attends all scheduled provider appointments Checks blood sugars as prescribed and utilize hyper and hypoglycemia protocol as needed Adheres to prescribed ADA/carb modified check blood sugar at prescribed times - check blood sugar if I feel it is too high or too low - enter blood sugar readings and medication or insulin into my phone log - take the blood sugar log to all doctor visits - take the blood sugar meter to all doctor visits    Care Plan : CCM RN- Chronic Pain (Adult)- lower extremeties  Updates made by [name], RN since 08/12/2022 12:00 AM     Problem: Chronic Pain Management (Chronic Pain)   Priority: High  Onset Date: 07/15/2020     Long-Range Goal: Chronic Pain Managed   Start Date: 07/15/2020  Expected End Date: 11/12/2022  Recent Progress: On track  Priority:  High  Note:   Current Barriers:  Knowledge Deficits related to self-health management of chronic lower extremity pain Chronic Disease Management support and education needs related to chronic pain 08/12/22: Chronic leg and foot pain, managed with meds, no complaints today Clinical Goal(s):  patient will verbalize understanding of plan for pain management.  and patient will use pharmacological and nonpharmacological pain relief strategies as prescribed.  Interventions:  Pain assessment performed Medications reviewed Discussed plans with patient for ongoing care management follow up and provided patient with direct contact information for care management team Discussed improvement  in DM control will likely result in less pain in legs and feet  Patient Goals/Self Care Activities:  Will self-administer  medications as prescribed Will attend all scheduled provider appointments Will call pharmacy for medication refills 7 days prior to needed refill date Patient will calls provider office for new concerns or questions Patient will get appt scheduled with general surgeon Follow Up Plan: The care management team will reach out to the patient again over the next 30-60 days.    Care Plan : General Plan of Care (Adult)  Updates made by [name], RN since 08/12/2022 12:00 AM     Problem: Health Promotion or Disease Self-Management (General Plan of Care)   Priority: Medium  Onset Date: 10/07/2020     Long-Range Goal: Self-Management Plan Developed   Start Date: 10/07/2020  Expected End Date: 11/12/2022  Recent Progress: On track  Priority: Medium  Note:   Current Barriers:  Ineffective Self Health Maintenance Currently UNABLE TO independently self manage needs related to chronic health conditions, HTN, DM, HF, OSA, vision loss, PAF, HLD, CKD, chronic pain Knowledge Deficits related to short term plan for care coordination needs and long term plans for chronic disease management needs 08/12/22:  patient non adherent at times to medication regimen and recommendations.  Patient has PCP f/u 4/16 and EGD 4/5.  Encouraged  patient to take medications as directed. Some swelling  today-no other complaints Nurse Case Manager Clinical Goal(s):  patient will work with care management team to address care coordination and chronic disease management needs related to Disease Management Educational Needs Care Coordination Medication Management and Education Medication Reconciliation Medication Assistance  Psychosocial Support    Heart Failure Interventions:  (Status:  New goal.) Long Term Goal Provided education on low sodium diet Assessed need for readable accurate scales in home Provided education about placing scale on hard, flat surface Advised patient to weigh each morning after emptying  bladder Discussed importance of daily weight and advised patient to weigh and record daily Reviewed role of diuretics in prevention of fluid overload and management of heart failure; Discussed the importance of keeping all appointments with provider Assessed social determinant of health barriers   Hyperlipidemia Interventions:  (Status:  New goal.) Long Term Goal Medication review performed; medication list updated in electronic medical record.  Provider established cholesterol goals reviewed Counseled on importance of regular laboratory monitoring as prescribed Discussed strategies to manage statin-induced myalgias Reviewed importance of limiting foods high in cholesterol Reviewed exercise goals and target of 150 minutes per week Assessed social determinant of health barriers   Hypertension Interventions:  (Status:  New goal.) Long Term Goal Last practice recorded BP readings:  BP Readings from Last 3 Encounters:  07/08/22 136/85  06/24/22 (!) 163/97  05/13/22 124/79  08/12/22         114/79 Most recent eGFR/CrCl:  Lab Results  Component Value Date   EGFR 65 04/27/2022  No components found for: "CRCL"  Evaluation of current treatment plan related to hypertension self management and patient's adherence to plan as established by provider Reviewed medications with patient and discussed importance of compliance Counseled on the importance of exercise goals with target of 150 minutes per week Discussed plans with patient for ongoing care management follow up and provided patient with direct contact information for care management team Advised patient, providing education and rationale, to monitor blood pressure daily and record, calling PCP for findings outside established parameters Reviewed scheduled/upcoming provider appointments including:  Discussed complications of poorly controlled blood pressure such as heart disease, stroke, circulatory complications, vision complications,  kidney impairment, sexual dysfunction Assessed social determinant of health barriers  Interventions:  Evaluation of current treatment plan and patient's adherence to plan as established by provider. Reviewed medications with patient. Collaborated with pharmacy regarding referrals. Collaborated with PCP for referrals-completed Collaborated with Care Guide for eyeglass provider that accepts patient's insurance-completed Care Guide referral for eyeglass provider-completed Discussed plans with patient for ongoing care management follow up and provided patient with direct contact information for care management team Advised patient, providing education and rationale, to monitor blood pressure daily and record, calling provider for findings outside established parameters.  Reviewed scheduled/upcoming provider appointments. Collaborated with PCP for general surgeon and podiatry referral-completed Advised patient, providing education and rationale, to check cbg and record, calling provider for findings outside established parameters.   Pharmacy referral for medication review. Advised patient to contact insurance company regarding resumption of aide service-completed Care Guide referral for first time home buyer resources-completed Collaborated with Care Guide for first time home buyer resources Collaborated with [facility] Self Care Activities:  Patient will self administer medications as prescribed Patient will attend all scheduled provider appointments Patient will call pharmacy for medication refills Patient will continue to perform ADL's independently Patient will call provider office for new concerns or questions Patient Goals:. In the next 30 days, patient will attend all appointments. In the next 30 days, patient will follow up on meter. In the next 30 days, patient will call insurance company and surgeon to schedule an appt. - Follow Up Plan: The patient has been provided with contact  information for the care management team and has been advised to call with any health related questions or concerns.  The care management team will reach out to the patient again over the next 45 business  days.     Follow Up:  Patient agrees to Care Plan and Follow-up.  Plan: The Managed Medicaid care management team will reach out to the patient again over the next 30 business  days. and The  Patient has been provided with contact information for the Managed Medicaid care management team and has been advised to call with any health related questions or concerns.  Date/time of next scheduled RN care management/care coordination outreach: 09/27/22 at 315

## 2022-08-12 NOTE — Telephone Encounter (Signed)
Next appt scheduled 4/16 with Dr Elliot Gurney.

## 2022-08-12 NOTE — Patient Instructions (Signed)
Visit Information  [name] was given information about Medicaid Managed Care team care coordination services as a part of their [place] Medicaid benefit. Quentel Whary verbally consented to engagement with the [facility] Managed Care team.   If you are experiencing a medical emergency, please call 911 or report to your local emergency department or urgent care.   If you have a non-emergency medical problem during routine business hours, please contact your provider's office and ask to speak with a nurse.   For questions related to your [facility], please call: [phone] or visit the homepage here: [email or web address]  If you would like to schedule transportation through your [facility], please call the following number at least 2 days in advance of your appointment: [phone]   Rides for urgent appointments can also be made after hours by calling Member Services.  Call the [place] at [phone], at any time, 24 hours a day, 7 days a week. If you are in danger or need immediate medical attention call 911.  If you would like help to quit smoking, call 1-800-QUIT-NOW 367-441-8062) OR Espaol: 1-855-Djelo-Ya [id number]:1601594) o para ms informacin haga clic aqu or Text READY to 200-400 to register via text  Mr. Hinzman - following are the goals we discussed in your visit today:   Goals Addressed             This Visit's Progress    Monitor and Manage My Blood Sugar-Diabetes Type 2       Timeframe:  Long-Range Goal Priority:  High Start Date:        06/03/20                     Expected End Date:   ongoing               Follow Up Date:09/27/22   - check blood sugar at prescribed times - check blood sugar if I feel it is too high or too low - enter blood sugar readings and medication or insulin into my phone log - take  the blood sugar log to all doctor visits - take the blood sugar meter to all doctor visits    Why is this important?   Checking your blood sugar at home helps to keep it from getting very high or very low.  Writing the results in a diary or log helps the doctor know how to care for you.  Your blood sugar log should have the time, date and the results.  Also, write down the amount of insulin or other medicine that you take.  Other information, like what you ate, exercise done and how you were feeling, will also be helpful.     Notes:-  08/12/22:  Blood sugars low 200s per patient-patient eating things he shouldn't.  Has PCP appt 4/16     Protect My Health       Timeframe:  Long-Range Goal Priority:  Medium Start Date:      10/07/20                       Expected End Date:  ongoing                Follow Up Date:09/27/22   - schedule appointment for flu shot - schedule appointment for vaccines needed due to my age or health - schedule recommended health tests. - schedule and keep appointment  for annual check-up   Why is this important?   Screening tests can find diseases early when they are easier to treat.  Your doctor or nurse will talk with you about which tests are important for you.  Getting shots for common diseases like the flu and shingles will help prevent them.   08/12/22:  EGD 4/5 and PCP 4/16    Track and Manage Fluids and Swelling-Heart Failure       Timeframe:  Long-Range Goal Priority:  High Start Date:          01/14/20                   Expected End Date:   ongoing     Follow Up Date: 09/27/22   - call office if I gain more than 2 pounds in one day or 5 pounds in one week - do ankle pumps when sitting - use salt in moderation - watch for swelling in feet, ankles and legs every day - weigh myself daily  - log weight in my phone   Why is this important?   It is important to check your weight daily and watch how much salt and liquids you have.  It will help you to  manage your heart failure.    Notes: 08/12/22:  patient with some swelling today, has not been taking medication as directed-patient to resume medication as directed.    Track and Manage My Blood Pressure-Hypertension       Timeframe:  Long-Range Goal Priority:  High Start Date:     06/03/20                        Expected End Date:  ongoing                 Follow Up Date: 09/27/22  - check blood pressure daily - write blood pressure results in my phone log    Why is this important?   You won't feel high blood pressure, but it can still hurt your blood vessels.  High blood pressure can cause heart or kidney problems. It can also cause a stroke.  Making lifestyle changes like losing a little weight or eating less salt will help.  Checking your blood pressure at home and at different times of the day can help to control blood pressure.  If the doctor prescribes medicine remember to take it the way the doctor ordered.  Call the office if you cannot afford the medicine or if there are questions about it 08/12/22:  BP WNL per patient-114/79.  Has PCP f/u 4/16.   Patient verbalizes understanding of instructions and care plan provided today and agrees to view in [place]. Active MyChart status and patient understanding of how to access instructions and care plan via MyChart confirmed with patient.     The Managed Medicaid care management team will reach out to the patient again over the next 45 business  days.  The  Patient   has been provided with contact information for the Managed Medicaid care management team and has been advised to call with any health related questions or concerns.   [name] RN, BSN [place] Management Coordinator - Managed Medicaid High Risk [phone]   Following is a copy of your plan of care:  Care Plan : CCM RN- Diabetes Type 2 (Adult)  Updates made by [name], RN since 08/12/2022 12:00 AM  Problem: Glycemic  Management (Diabetes, Type 2)   Priority: High  Onset Date: 06/03/2020     Long-Range Goal: Glycemic Management Optimized   Start Date: 07/20/2019  Expected End Date: 11/08/2022  Recent Progress: Not on track  Priority: High  Note:    CARE PLAN ENTRY (see longitudinal plan of care for additional care plan information)  Current Barriers:  Knowledge Deficits related to basic Diabetes pathophysiology and self care/management Knowledge Deficits related to medications used for management of diabetes 23/28/24: Patient states he is taking all medications-checks blood sugar in morning per patient-low 200s.   Has PCP f/u 4/16.  States he has not been eating correctly. Case Manager Clinical Goal(s):  Over the next 30-60  days, patient will demonstrate improved adherence to prescribed treatment plan for diabetes self care/management as evidenced by:  daily monitoring and recording of CBG  adherence to ADA/ carb modified diet adherence to prescribed medication regimen  Interventions:  Reviewed education with patient about basic DM disease process Reviewed medications and assessed medication taking behavior Ensured patient has ample supply of all prescribed medications Advised patient, providing education and rationale, to check CBG at least daily and record, calling provider and/or CCM RN for findings outside established parameters. Educated patient that leg and foot pain will likely decrease if blood sugar control is improved and a CGM could provide that assistance  Discussed plans with patient for ongoing care management follow up and provided patient with direct contact information for care management team Review of patient status, including review of consultants reports, relevant laboratory and other test results, and medications completed. Collaborated with [facility] for insulin. 04/16/22:  Patient provided with [facility] phone number.  Patient Self Care Activities:  UNABLE to independently  self manage DM Self administers oral medications as prescribed Self administers insulin as prescribed Self administers injectable DM medication Xultophy as prescribed Attends all scheduled provider appointments Checks blood sugars as prescribed and utilize hyper and hypoglycemia protocol as needed Adheres to prescribed ADA/carb modified check blood sugar at prescribed times - check blood sugar if I feel it is too high or too low - enter blood sugar readings and medication or insulin into my phone log - take the blood sugar log to all doctor visits - take the blood sugar meter to all doctor visits    Care Plan : CCM RN- Chronic Pain (Adult)- lower extremeties  Updates made by [name], RN since 08/12/2022 12:00 AM     Problem: Chronic Pain Management (Chronic Pain)   Priority: High  Onset Date: 07/15/2020     Long-Range Goal: Chronic Pain Managed   Start Date: 07/15/2020  Expected End Date: 11/12/2022  Recent Progress: On track  Priority: High  Note:   Current Barriers:  Knowledge Deficits related to self-health management of chronic lower extremity pain Chronic Disease Management support and education needs related to chronic pain 08/12/22: Chronic leg and foot pain, managed with meds, no complaints today Clinical Goal(s):  patient will verbalize understanding of plan for pain management.  and patient will use pharmacological and nonpharmacological pain relief strategies as prescribed.  Interventions:  Pain assessment performed Medications reviewed Discussed plans with patient for ongoing care management follow up and provided patient with direct contact information for care management team Discussed improvement  in DM control will likely result in less pain in legs and feet  Patient Goals/Self Care Activities:  Will self-administer medications as prescribed Will attend all scheduled provider appointments Will call pharmacy for medication refills  7 days prior to needed refill  date Patient will calls provider office for new concerns or questions Patient will get appt scheduled with general surgeon Follow Up Plan: The care management team will reach out to the patient again over the next 30-60 days.    Care Plan : General Plan of Care (Adult)  Updates made by [name], RN since 08/12/2022 12:00 AM     Problem: Health Promotion or Disease Self-Management (General Plan of Care)   Priority: Medium  Onset Date: 10/07/2020     Long-Range Goal: Self-Management Plan Developed   Start Date: 10/07/2020  Expected End Date: 11/12/2022  Recent Progress: On track  Priority: Medium  Note:   Current Barriers:  Ineffective Self Health Maintenance Currently UNABLE TO independently self manage needs related to chronic health conditions, HTN, DM, HF, OSA, vision loss, PAF, HLD, CKD, chronic pain Knowledge Deficits related to short term plan for care coordination needs and long term plans for chronic disease management needs 08/12/22:  patient non adherent at times to medication regimen and recommendations.  Patient has PCP f/u 4/16 and EGD 4/5.  Encouraged  patient to take medications as directed. Some swelling  today-no other complaints Nurse Case Manager Clinical Goal(s):  patient will work with care management team to address care coordination and chronic disease management needs related to Disease Management Educational Needs Care Coordination Medication Management and Education Medication Reconciliation Medication Assistance  Psychosocial Support    Heart Failure Interventions:  (Status:  New goal.) Long Term Goal Provided education on low sodium diet Assessed need for readable accurate scales in home Provided education about placing scale on hard, flat surface Advised patient to weigh each morning after emptying bladder Discussed importance of daily weight and advised patient to weigh and record daily Reviewed role of diuretics in prevention of fluid overload  and management of heart failure; Discussed the importance of keeping all appointments with provider Assessed social determinant of health barriers   Hyperlipidemia Interventions:  (Status:  New goal.) Long Term Goal Medication review performed; medication list updated in electronic medical record.  Provider established cholesterol goals reviewed Counseled on importance of regular laboratory monitoring as prescribed Discussed strategies to manage statin-induced myalgias Reviewed importance of limiting foods high in cholesterol Reviewed exercise goals and target of 150 minutes per week Assessed social determinant of health barriers   Hypertension Interventions:  (Status:  New goal.) Long Term Goal Last practice recorded BP readings:  BP Readings from Last 3 Encounters:  07/08/22 136/85  06/24/22 (!) 163/97  05/13/22 124/79  08/12/22         114/79 Most recent eGFR/CrCl:  Lab Results  Component Value Date   EGFR 65 04/27/2022    No components found for: "CRCL"  Evaluation of current treatment plan related to hypertension self management and patient's adherence to plan as established by provider Reviewed medications with patient and discussed importance of compliance Counseled on the importance of exercise goals with target of 150 minutes per week Discussed plans with patient for ongoing care management follow up and provided patient with direct contact information for care management team Advised patient, providing education and rationale, to monitor blood pressure daily and record, calling PCP for findings outside established parameters Reviewed scheduled/upcoming provider appointments including:  Discussed complications of poorly controlled blood pressure such as heart disease, stroke, circulatory complications, vision complications, kidney impairment, sexual dysfunction Assessed social determinant of health barriers  Interventions:  Evaluation of current treatment plan and  patient's adherence  to plan as established by provider. Reviewed medications with patient. Collaborated with pharmacy regarding referrals. Collaborated with PCP for referrals-completed Collaborated with Care Guide for eyeglass provider that accepts patient's insurance-completed Care Guide referral for eyeglass provider-completed Discussed plans with patient for ongoing care management follow up and provided patient with direct contact information for care management team Advised patient, providing education and rationale, to monitor blood pressure daily and record, calling provider for findings outside established parameters.  Reviewed scheduled/upcoming provider appointments. Collaborated with PCP for general surgeon and podiatry referral-completed Advised patient, providing education and rationale, to check cbg and record, calling provider for findings outside established parameters.   Pharmacy referral for medication review. Advised patient to contact insurance company regarding resumption of aide service-completed Care Guide referral for first time home buyer resources-completed Collaborated with Care Guide for first time home buyer resources Collaborated with [facility] Self Care Activities:  Patient will self administer medications as prescribed Patient will attend all scheduled provider appointments Patient will call pharmacy for medication refills Patient will continue to perform ADL's independently Patient will call provider office for new concerns or questions Patient Goals:. In the next 30 days, patient will attend all appointments. In the next 30 days, patient will follow up on meter. In the next 30 days, patient will call insurance company and surgeon to schedule an appt. - Follow Up Plan: The patient has been provided with contact information for the care management team and has been advised to call with any health related questions or concerns.  The care management team will  reach out to the patient again over the next 45 business  days.

## 2022-08-17 ENCOUNTER — Telehealth: Payer: Self-pay

## 2022-08-17 NOTE — Telephone Encounter (Signed)
Prior Authorization for patient (Jardiance) came through on cover my meds was submitted with last office notes and labs awaiting approval or denial. 

## 2022-08-17 NOTE — Telephone Encounter (Signed)
Decision:Approved [name] (KeyDana Allan) PA Case ID #: [id number] Rx #: [id number] Need Help? Call us at [phone] Outcome Approved today Request Reference Number: [id number]. JARDIANCE TAB 25MG  is approved through 08/17/2023. For further questions, call [employer] at [phone]. Authorization Expiration Date: 08/17/2023 Drug Jardiance 25MG  tablets ePA cloud logo Form [employer] Medicaid Electronic Prior Authorization Form [phone] NCPDP) Original Claim Info 87

## 2022-08-20 ENCOUNTER — Encounter: Payer: Self-pay | Admitting: Gastroenterology

## 2022-08-20 ENCOUNTER — Ambulatory Visit (AMBULATORY_SURGERY_CENTER): Payer: Medicaid Other | Admitting: Gastroenterology

## 2022-08-20 ENCOUNTER — Other Ambulatory Visit (HOSPITAL_COMMUNITY): Payer: Self-pay

## 2022-08-20 VITALS — BP 145/99 | HR 74 | Temp 98.9°F | Resp 18 | Ht 71.0 in | Wt 291.4 lb

## 2022-08-20 DIAGNOSIS — I10 Essential (primary) hypertension: Secondary | ICD-10-CM | POA: Diagnosis not present

## 2022-08-20 DIAGNOSIS — I509 Heart failure, unspecified: Secondary | ICD-10-CM | POA: Diagnosis not present

## 2022-08-20 DIAGNOSIS — K2970 Gastritis, unspecified, without bleeding: Secondary | ICD-10-CM | POA: Diagnosis not present

## 2022-08-20 DIAGNOSIS — K219 Gastro-esophageal reflux disease without esophagitis: Secondary | ICD-10-CM | POA: Diagnosis not present

## 2022-08-20 DIAGNOSIS — K2100 Gastro-esophageal reflux disease with esophagitis, without bleeding: Secondary | ICD-10-CM

## 2022-08-20 DIAGNOSIS — K209 Esophagitis, unspecified without bleeding: Secondary | ICD-10-CM | POA: Diagnosis not present

## 2022-08-20 DIAGNOSIS — J45909 Unspecified asthma, uncomplicated: Secondary | ICD-10-CM | POA: Diagnosis not present

## 2022-08-20 DIAGNOSIS — E119 Type 2 diabetes mellitus without complications: Secondary | ICD-10-CM | POA: Diagnosis not present

## 2022-08-20 DIAGNOSIS — F419 Anxiety disorder, unspecified: Secondary | ICD-10-CM | POA: Diagnosis not present

## 2022-08-20 DIAGNOSIS — K920 Hematemesis: Secondary | ICD-10-CM | POA: Diagnosis not present

## 2022-08-20 DIAGNOSIS — I4891 Unspecified atrial fibrillation: Secondary | ICD-10-CM | POA: Diagnosis not present

## 2022-08-20 MED ORDER — SUCRALFATE 1 GM/10ML PO SUSP
1.0000 g | Freq: Four times a day (QID) | ORAL | 6 refills | Status: DC
Start: 2022-08-20 — End: 2022-11-24
  Filled 2022-08-20 – 2022-09-14 (×2): qty 420, 11d supply, fill #0

## 2022-08-20 MED ORDER — SODIUM CHLORIDE 0.9 % IV SOLN
500.0000 mL | Freq: Once | INTRAVENOUS | Status: DC
Start: 2022-08-20 — End: 2022-11-24

## 2022-08-20 MED ORDER — INSULIN REGULAR HUMAN 100 UNIT/ML IJ SOLN
6.0000 [IU] | Freq: Once | INTRAMUSCULAR | Status: AC
Start: 2022-08-20 — End: 2022-08-20
  Administered 2022-08-20: 6 [IU] via SUBCUTANEOUS

## 2022-08-20 NOTE — Progress Notes (Signed)
GASTROENTEROLOGY PROCEDURE H&P NOTE   Primary Care Physician: [name], MD  HPI: Terry Parks is a 67 y.o. male who presents for EGD for followup of Grade D Esophagitis.  Past Medical History:  Diagnosis Date   Abscessed tooth    top back large cavity no pain or drainage, one on bottom  pt pulled tooth 4-5 months ago, right top large hole in tooth   AKI (acute kidney injury)    Allergic rhinitis    Anemia    Anxiety    Asthma    Atrial fibrillation    BPH (benign prostatic hypertrophy)    Massive BPH noted on cystoscopy 1/23/ 2012 by Dr. Isabel Caprice.   Cancer    prostate cancer 2019   Cardiomyopathy    CHF (congestive heart failure)    Cough 03/30/2012   Depression    Diabetes mellitus 04/08/2008   type 2   Dyspnea    Dysrhythmia 2019   Foley catheter in place 07-05-17 placed   Fracture, orbital 2021   Right   GERD (gastroesophageal reflux disease) 2020   Headache(784.0)    hx migraines none recent   History of esophagitis 12/16/2019   Hyperlipemia    Hypertension    Hypertensive cardiopathy 03/01/2006   2-D echocardiogram 02/01/2012 showed moderate LVH, mildly to moderately reduced left ventricular systolic function with an estimated ejection fraction of 40-45%, and diffuse hypokinesis.  A nuclear medicine stress study done 01/31/2012 showed no reversible ischemia, a small mid anterior wall fixed defect/infarct, and ejection fraction 42%.       Neck pain    Nephrolithiasis 05/29/2010   CT scan of abdomen/pelvis on 05/29/2010 showed an obstructing approximate 1-2 mm calculus at the left UVJ, and an approximate 1-2 mm left lower pole renal calculus.   Patient had continuing severe pain , and an elevation of his serum creatinine to a value of 1.75 on 06/06/2010.  Patient underwent cystoscopy on 06/08/2010 by Dr. Isabel Caprice, but attempts at retrograde pyelogram and ureteroscopy were unsucc   Numbness 01/08/2018   Obstructive sleep apnea 03/06/2008   Sleep study 03/06/08 showed  severe OSA/hypopnea syndrome, with successful CPAP titration to 13 CWP using a medium ResMed Mirage Quattro full face mask with heated humidifier.    Rash 04/17/2014   Renal calculus 05/29/2010   CT scan of abdomen/pelvis on 05/29/2010 showed an obstructing approximate 1-2 mm calculus at the left UVJ, and an approximate 1-2 mm left lower pole renal calculus.   Patient had continuing severe pain , and an elevation of his serum creatinine to a value of 1.75 on 06/06/2010.  The stone had apparently passed and was not seen on repeat CT 06/08/2010.   Sleep apnea    haven't use cpap in 2 years   Tooth pain 10/29/2017   Urinary straining 11/02/2016   Past Surgical History:  Procedure Laterality Date   big toe nails removed Bilateral 20 yrs ago   BIOPSY  06/23/2022   Procedure: BIOPSY;  Surgeon: Shellia Cleverly, DO;  Location: [facility];  Service: Gastroenterology;;   CARDIOVERSION N/A 06/08/2017   Procedure: CARDIOVERSION;  Surgeon: Wendall Stade, MD;  Location: [facility];  Service: Cardiovascular;  Laterality: N/A;   COLONOSCOPY     CYSTOSCOPY W/ RETROGRADES     ESOPHAGOGASTRODUODENOSCOPY (EGD) WITH PROPOFOL N/A 06/23/2022   Procedure: ESOPHAGOGASTRODUODENOSCOPY (EGD) WITH PROPOFOL;  Surgeon: Shellia Cleverly, DO;  Location: [facility];  Service: Gastroenterology;  Laterality: N/A;   FRACTURE SURGERY Right 2021  5th right phalange   HEMOSTASIS CLIP PLACEMENT  06/23/2022   Procedure: HEMOSTASIS CLIP PLACEMENT;  Surgeon: Shellia Cleverly, DO;  Location: [facility];  Service: Gastroenterology;;   INCISIONAL HERNIA REPAIR N/A 03/24/2020   Procedure: LAPAROSCOPIC INCISIONAL HERNIA REPAR WITH MESH;  Surgeon: Violeta Gelinas, MD;  Location: [facility] OR;  Service: General;  Laterality: N/A;   IR RADIOLOGIST EVAL & MGMT  02/02/2018   LYMPHADENECTOMY Bilateral 10/06/2017   Procedure: LYMPHADENECTOMY;  Surgeon: Crista Elliot, MD;  Location: [facility];  Service: Urology;  Laterality: Bilateral;   ROBOT  ASSISTED LAPAROSCOPIC RADICAL PROSTATECTOMY N/A 10/06/2017   Procedure: XI ROBOTIC ASSISTED LAPAROSCOPIC RADICAL PROSTATECTOMY;  Surgeon: Crista Elliot, MD;  Location: [facility];  Service: Urology;  Laterality: N/A;   TRANSURETHRAL RESECTION OF BLADDER TUMOR N/A 07/13/2017   Procedure: TRANSURETHRAL RESECTION OF PROSTATE;  Surgeon: Crista Elliot, MD;  Location: [facility];  Service: Urology;  Laterality: N/A;   Current Outpatient Medications  Medication Sig Dispense Refill   Accu-Chek Softclix Lancets lancets Check blood sugar three times a day as instructed 300 each 11   amiodarone (PACERONE) 200 MG tablet TAKE ONE TABLET BY MOUTH EVERY MORNING (Patient taking differently: Take 200 mg by mouth daily.) 60 tablet 6   amLODipine (NORVASC) 5 MG tablet TAKE ONE TABLET BY MOUTH EVERY EVENING 30 tablet 6   apixaban (ELIQUIS) 5 MG TABS tablet Take 1 tablet (5 mg total) by mouth 2 (two) times daily. 60 tablet 0   atorvastatin (LIPITOR) 40 MG tablet TAKE ONE TABLET BY MOUTH EVERY EVENING 30 tablet 6   Blood Glucose Monitoring Suppl (ACCU-CHEK GUIDE) w/Device KIT 1 Units by Does not apply route 3 (three) times daily. 1 kit 0   Blood Glucose Monitoring Suppl (BLOOD GLUCOSE MONITOR SYSTEM) w/Device KIT Use up to 4 (four) times daily as directed 1 kit 0   carvedilol (COREG) 25 MG tablet TAKE ONE TABLET BY MOUTH EVERY MORNING and TAKE ONE TABLET BY MOUTH EVERY EVENING 180 tablet 6   COMFORT EZ PEN NEEDLES 31G X 6 MM MISC USE AS DIRECTED AT BEDTIME 100 each 3   COMFORT EZ PEN NEEDLES 32G X 6 MM MISC      DULoxetine (CYMBALTA) 60 MG capsule Take 1 capsule (60 mg total) by mouth daily. 90 capsule 3   empagliflozin (JARDIANCE) 25 MG TABS tablet TAKE 1 TABLET (25 MG TOTAL) BY MOUTH DAILY BEFORE BREAKFAST. (Patient taking differently: Take 25 mg by mouth daily.) 90 tablet 3   esomeprazole (NEXIUM) 40 MG capsule Take 1 capsule (40 mg total) by mouth 2 (two) times daily before a meal. 60 capsule 0   ezetimibe (ZETIA)  10 MG tablet TAKE ONE TABLET BY MOUTH EVERY EVENING 90 tablet 2   glucose blood (ACCU-CHEK GUIDE) test strip USE TO check blood glucose THREE TIMES DAILY AS DIRECTED 300 strip 11   insulin glargine (LANTUS SOLOSTAR) 100 UNIT/ML Solostar Pen Inject 52 Units into the skin daily. 15 mL 5   Ipratropium-Albuterol (COMBIVENT RESPIMAT) 20-100 MCG/ACT AERS respimat Inhale 1 puff into the lungs every 6 (six) hours. (Patient taking differently: Inhale 1 puff into the lungs every 6 (six) hours as needed for shortness of breath.) 4 g 3   sacubitril-valsartan (ENTRESTO) 97-103 MG TAKE ONE TABLET BY MOUTH EVERY MORNING and TAKE ONE TABLET BY MOUTH EVERY EVENING 180 tablet 6   spironolactone (ALDACTONE) 50 MG tablet TAKE ONE TABLET BY MOUTH EVERY MORNING 30 tablet 6   sucralfate (  CARAFATE) 1 GM/10ML suspension Take 10 mLs (1 g total) by mouth 4 (four) times daily for 14 days, THEN 10 mLs (1 g total) 2 (two) times daily for 14 days. 840 mL 0   torsemide (DEMADEX) 20 MG tablet TAKE TWO TABLETS BY MOUTH EVERY MORNING (Patient taking differently: Take 40 mg by mouth daily.) 180 tablet 2   Current Facility-Administered Medications  Medication Dose Route Frequency Provider Last Rate Last Admin   0.9 %  sodium chloride infusion  500 mL Intravenous Once Mansouraty, [name], MD        Current Outpatient Medications:    Accu-Chek Softclix Lancets lancets, Check blood sugar three times a day as instructed, Disp: 300 each, Rfl: 11   amiodarone (PACERONE) 200 MG tablet, TAKE ONE TABLET BY MOUTH EVERY MORNING (Patient taking differently: Take 200 mg by mouth daily.), Disp: 60 tablet, Rfl: 6   amLODipine (NORVASC) 5 MG tablet, TAKE ONE TABLET BY MOUTH EVERY EVENING, Disp: 30 tablet, Rfl: 6   apixaban (ELIQUIS) 5 MG TABS tablet, Take 1 tablet (5 mg total) by mouth 2 (two) times daily., Disp: 60 tablet, Rfl: 0   atorvastatin (LIPITOR) 40 MG tablet, TAKE ONE TABLET BY MOUTH EVERY EVENING, Disp: 30 tablet, Rfl: 6   Blood  Glucose Monitoring Suppl (ACCU-CHEK GUIDE) w/Device KIT, 1 Units by Does not apply route 3 (three) times daily., Disp: 1 kit, Rfl: 0   Blood Glucose Monitoring Suppl (BLOOD GLUCOSE MONITOR SYSTEM) w/Device KIT, Use up to 4 (four) times daily as directed, Disp: 1 kit, Rfl: 0   carvedilol (COREG) 25 MG tablet, TAKE ONE TABLET BY MOUTH EVERY MORNING and TAKE ONE TABLET BY MOUTH EVERY EVENING, Disp: 180 tablet, Rfl: 6   COMFORT EZ PEN NEEDLES 31G X 6 MM MISC, USE AS DIRECTED AT BEDTIME, Disp: 100 each, Rfl: 3   COMFORT EZ PEN NEEDLES 32G X 6 MM MISC, , Disp: , Rfl:    DULoxetine (CYMBALTA) 60 MG capsule, Take 1 capsule (60 mg total) by mouth daily., Disp: 90 capsule, Rfl: 3   empagliflozin (JARDIANCE) 25 MG TABS tablet, TAKE 1 TABLET (25 MG TOTAL) BY MOUTH DAILY BEFORE BREAKFAST. (Patient taking differently: Take 25 mg by mouth daily.), Disp: 90 tablet, Rfl: 3   esomeprazole (NEXIUM) 40 MG capsule, Take 1 capsule (40 mg total) by mouth 2 (two) times daily before a meal., Disp: 60 capsule, Rfl: 0   ezetimibe (ZETIA) 10 MG tablet, TAKE ONE TABLET BY MOUTH EVERY EVENING, Disp: 90 tablet, Rfl: 2   glucose blood (ACCU-CHEK GUIDE) test strip, USE TO check blood glucose THREE TIMES DAILY AS DIRECTED, Disp: 300 strip, Rfl: 11   insulin glargine (LANTUS SOLOSTAR) 100 UNIT/ML Solostar Pen, Inject 52 Units into the skin daily., Disp: 15 mL, Rfl: 5   Ipratropium-Albuterol (COMBIVENT RESPIMAT) 20-100 MCG/ACT AERS respimat, Inhale 1 puff into the lungs every 6 (six) hours. (Patient taking differently: Inhale 1 puff into the lungs every 6 (six) hours as needed for shortness of breath.), Disp: 4 g, Rfl: 3   sacubitril-valsartan (ENTRESTO) 97-103 MG, TAKE ONE TABLET BY MOUTH EVERY MORNING and TAKE ONE TABLET BY MOUTH EVERY EVENING, Disp: 180 tablet, Rfl: 6   spironolactone (ALDACTONE) 50 MG tablet, TAKE ONE TABLET BY MOUTH EVERY MORNING, Disp: 30 tablet, Rfl: 6   sucralfate (CARAFATE) 1 GM/10ML suspension, Take 10 mLs (1 g  total) by mouth 4 (four) times daily for 14 days, THEN 10 mLs (1 g total) 2 (two) times daily for 14  days., Disp: 840 mL, Rfl: 0   torsemide (DEMADEX) 20 MG tablet, TAKE TWO TABLETS BY MOUTH EVERY MORNING (Patient taking differently: Take 40 mg by mouth daily.), Disp: 180 tablet, Rfl: 2  Current Facility-Administered Medications:    0.9 %  sodium chloride infusion, 500 mL, Intravenous, Once, Mansouraty, Netty StarringGabriel Jr., MD Allergies  Allergen Reactions   Lisinopril Cough   Family History  Problem Relation Age of Onset   Diabetes Mother    Ulcerative colitis Mother    Breast cancer Mother    Hypertension Father    Stroke Father    Diabetes Maternal Grandmother    Colon cancer Neg Hx    Prostate cancer Neg Hx    Heart attack Neg Hx    Esophageal cancer Neg Hx    Inflammatory bowel disease Neg Hx    Liver disease Neg Hx    Pancreatic cancer Neg Hx    Rectal cancer Neg Hx    Stomach cancer Neg Hx    Social History   Socioeconomic History   Marital status: Single    Spouse name: Not on file   Number of children: 4   Years of education: 16   Highest education level: Not on file  Occupational History   Occupation:        Employer: UNEMPLOYED  Tobacco Use   Smoking status: Never   Smokeless tobacco: Never  Vaping Use   Vaping Use: Never used  Substance and Sexual Activity   Alcohol use: No    Alcohol/week: 0.0 standard drinks of alcohol   Drug use: No   Sexual activity: Not Currently  Other Topics Concern   Not on file  Social History Narrative   Divorced, 4 children, lives alone.     Social Determinants of Health   Financial Resource Strain: Low Risk  (07/15/2022)   Overall Financial Resource Strain (CARDIA)    Difficulty of Paying Living Expenses: Not very hard  Food Insecurity: No Food Insecurity (06/23/2022)   Hunger Vital Sign    Worried About Running Out of Food in the Last Year: Never true    Ran Out of Food in the Last Year: Never true  Transportation Needs: No  Transportation Needs (06/23/2022)   PRAPARE - Administrator, Civil ServiceTransportation    Lack of Transportation (Medical): No    Lack of Transportation (Non-Medical): No  Physical Activity: Sufficiently Active (08/12/2022)   Exercise Vital Sign    Days of Exercise per Week: 7 days    Minutes of Exercise per Session: 30 min  Stress: No Stress Concern Present (07/15/2022)   [employer] - Occupational Stress Questionnaire    Feeling of Stress : Only a little  Social Connections: Moderately Integrated (08/12/2022)   Social Connection and Isolation Panel [NHANES]    Frequency of Communication with Friends and Family: More than three times a week    Frequency of Social Gatherings with Friends and Family: More than three times a week    Attends Religious Services: More than 4 times per year    Active Member of [employer]ubs or Organizations: Yes    Attends BankerClub or Organization Meetings: More than 4 times per year    Marital Status: Divorced  Intimate Partner Violence: Not At Risk (06/23/2022)   Humiliation, Afraid, Rape, and Kick questionnaire    Fear of Current or Ex-Partner: No    Emotionally Abused: No    Physically Abused: No    Sexually Abused: No    Physical Exam: Today's Vitals  08/20/22 1446  BP: 100/65  Pulse: 80  Temp: 98.9 F (37.2 C)  SpO2: 97%  Weight: 291 lb 6.4 oz (132.2 kg)  Height: 5\' 11"  (1.803 m)   Body mass index is 40.64 kg/m. GEN: NAD EYE: Sclerae anicteric ENT: MMM CV: Non-tachycardic GI: Soft, NT/ND NEURO:  Alert & Oriented x 3  Lab Results: No results for input(s): "WBC", "HGB", "HCT", "PLT" in the last 72 hours. BMET No results for input(s): "NA", "K", "CL", "CO2", "GLUCOSE", "BUN", "CREATININE", "CALCIUM" in the last 72 hours. LFT No results for input(s): "PROT", "ALBUMIN", "AST", "ALT", "ALKPHOS", "BILITOT", "BILIDIR", "IBILI" in the last 72 hours. PT/INR No results for input(s): "LABPROT", "INR" in the last 72 hours.   Impression / Plan: This is a  67 y.o.male who presents for EGD for followup of Grade D Esophagitis.  The risks and benefits of endoscopic evaluation/treatment were discussed with the patient and/or family; these include but are not limited to the risk of perforation, infection, bleeding, missed lesions, lack of diagnosis, severe illness requiring hospitalization, as well as anesthesia and sedation related illnesses.  The patient's history has been reviewed, patient examined, no change in status, and deemed stable for procedure.  The patient and/or family is agreeable to proceed.    Corliss Parish, MD [facility] Advanced [facility]oscopy Office # [phone]

## 2022-08-20 NOTE — Progress Notes (Signed)
A and O x3. Report to RN. Tolerated MAC anesthesia well.Teeth unchanged after procedure. 

## 2022-08-20 NOTE — Op Note (Signed)
[facility] Patient Name: Terry Parks Procedure Date: 08/20/2022 3:22 PM MRN: [id number] Endoscopist: Corliss Parish , MD, [id number] Age: 67 Referring MD:  Date of Birth: 05/14/1956 Gender: Male Account #: [id number] Procedure:                Upper GI endoscopy Indications:              Follow-up of esophagitis Medicines:                Monitored Anesthesia Care Procedure:                Pre-Anesthesia Assessment:                           - Prior to the procedure, a History and Physical                            was performed, and patient medications and                            allergies were reviewed. The patient's tolerance of                            previous anesthesia was also reviewed. The risks                            and benefits of the procedure and the sedation                            options and risks were discussed with the patient.                            All questions were answered, and informed consent                            was obtained. Prior Anticoagulants: The patient has                            taken Eliquis (apixaban), last dose was 2 days                            prior to procedure. ASA Grade Assessment: III - A                            patient with severe systemic disease. After                            reviewing the risks and benefits, the patient was                            deemed in satisfactory condition to undergo the                            procedure.  After obtaining informed consent, the endoscope was                            passed under direct vision. Throughout the                            procedure, the patient's blood pressure, pulse, and                            oxygen saturations were monitored continuously. The                            GIF [id number] #[id number] was introduced through the                            mouth, and advanced to the second part of duodenum.                             The upper GI endoscopy was accomplished without                            difficulty. The patient tolerated the procedure. Scope In: Scope Out: Findings:                 No gross lesions were noted in the proximal                            esophagus and in the mid esophagus.                           LA Grade C (one or more mucosal breaks continuous                            between tops of 2 or more mucosal folds, less than                            75% circumference) esophagitis with no bleeding was                            found in the distal esophagus (approximately 6 cm                            worth of esophagitis).                           Three tongues of salmon-colored mucosa were present                            from 38 to 41 cm & 39 to 41 cm & 40 to 41 cm and                            scattered islands of salmon-colored mucosa were  present from 39 to 41 cm. No other visible                            abnormalities were present. Biopsies were taken                            with a cold forceps for histology to rule in/out                            Barrett's.                           The Z-line was irregular and was found 41 cm from                            the incisors.                           A 3 cm hiatal hernia was present.                           Patchy mild inflammation characterized by erosions                            and erythema was found in the entire examined                            stomach. The stomach was recently biopsied in the                            last few months so it was not really biopsied.                           No gross lesions were noted in the duodenal bulb,                            in the first portion of the duodenum and in the                            second portion of the duodenum. Complications:            No immediate complications. Estimated Blood Loss:      Estimated blood loss was minimal. Impression:               - No gross lesions in the proximal esophagus and in                            the mid esophagus.                           - LA Grade C erosive esophagitis with no bleeding                            distally.                           -  Salmon-colored mucosa suggestive of short-segment                            Barrett's esophagus. Biopsied.                           - Z-line irregular, 41 cm from the incisors.                           - 3 cm hiatal hernia.                           - Gastritis. Previously biopsied and negative for                            H. pylori.                           - No gross lesions in the duodenal bulb, in the                            first portion of the duodenum and in the second                            portion of the duodenum. Recommendation:           - The patient will be observed post-procedure,                            until all discharge criteria are met.                           - Discharge patient to home.                           - Patient has a contact number available for                            emergencies. The signs and symptoms of potential                            delayed complications were discussed with the                            patient. Return to normal activities tomorrow.                            Written discharge instructions were provided to the                            patient.                           - Resume previous diet.                           -  Patient should remain on twice daily PPI 40 mg.                            If he has been taking this, we may have to consider                            transition of PPI and/or utilization of Voquenza in                            the future.                           - Carafate 1 g 4 times daily for next 1 month then                            go to twice daily. Use as a liquid or slurry.                            - May restart Eliquis on 4/6 PM.                           - Continue present medications.                           - Await pathology results.                           - Repeat upper endoscopy in 3 months to check                            healing.                           - The findings and recommendations were discussed                            with the patient.                           - The findings and recommendations were discussed                            with the designated responsible adult. Corliss ParishGabriel Mansouraty, MD 08/20/2022 3:47:24 PM

## 2022-08-20 NOTE — Patient Instructions (Addendum)
- Patient should remain on twice daily PPI 40 mg. If he has been taking this, we may have to consider transition of PPI and/or utilization of Voquenza in the future. - Carafate 1 g 4 times daily for next 1 month then go to twice daily. Use as a liquid or slurry. - May restart Eliquis on 4/6 PM. - Continue present medications. - Await pathology results. -Repeat upper endoscopy in 3 months to check  healing.  YOU HAD AN ENDOSCOPIC PROCEDURE TODAY AT THE [facility]:   Refer to the procedure report that was given to you for any specific questions about what was found during the examination.  If the procedure report does not answer your questions, please call your gastroenterologist to clarify.  If you requested that your care partner not be given the details of your procedure findings, then the procedure report has been included in a sealed envelope for you to review at your convenience later.  YOU SHOULD EXPECT: Some feelings of bloating in the abdomen. Passage of more gas than usual.  Walking can help get rid of the air that was put into your GI tract during the procedure and reduce the bloating. If you had a lower endoscopy (such as a colonoscopy or flexible sigmoidoscopy) you may notice spotting of blood in your stool or on the toilet paper. If you underwent a bowel prep for your procedure, you may not have a normal bowel movement for a few days.  Please Note:  You might notice some irritation and congestion in your nose or some drainage.  This is from the oxygen used during your procedure.  There is no need for concern and it should clear up in a day or so.  SYMPTOMS TO REPORT IMMEDIATELY:  Following upper endoscopy (EGD)  Vomiting of blood or coffee ground material  New chest pain or pain under the shoulder blades  Painful or persistently difficult swallowing  New shortness of breath  Fever of 100F or higher  Black, tarry-looking stools  For urgent or emergent issues, a  gastroenterologist can be reached at any hour by calling [phone]. Do not use MyChart messaging for urgent concerns.    DIET:  We do recommend a small meal at first, but then you may proceed to your regular diet.  Drink plenty of fluids but you should avoid alcoholic beverages for 24 hours.  ACTIVITY:  You should plan to take it easy for the rest of today and you should NOT DRIVE or use heavy machinery until tomorrow (because of the sedation medicines used during the test).    FOLLOW UP: Our staff will call the number listed on your records the next business day following your procedure.  We will call around 7:15- 8:00 am to check on you and address any questions or concerns that you may have regarding the information given to you following your procedure. If we do not reach you, we will leave a message.     If any biopsies were taken you will be contacted by phone or by letter within the next 1-3 weeks.  Please call us at [phone] if you have not heard about the biopsies in 3 weeks.    SIGNATURES/CONFIDENTIALITY: You and/or your care partner have signed paperwork which will be entered into your electronic medical record.  These signatures attest to the fact that that the information above on your After Visit Summary has been reviewed and is understood.  Full responsibility of the confidentiality of  this discharge information lies with you and/or your care-partner.

## 2022-08-20 NOTE — Progress Notes (Signed)
Called to room to assist during endoscopic procedure.  Patient ID and intended procedure confirmed with present staff. Received instructions for my participation in the procedure from the performing physician.  

## 2022-08-23 ENCOUNTER — Telehealth: Payer: Self-pay | Admitting: *Deleted

## 2022-08-23 NOTE — Telephone Encounter (Signed)
  Follow up Call-     08/20/2022    2:47 PM 06/09/2021    9:38 AM  Call back number  Post procedure Call Back phone  # [phone]  Permission to leave phone message Yes Yes     Patient questions:  Do you have a fever, pain , or abdominal swelling? No. Pain Score  0 *  Have you tolerated food without any problems? Yes.    Have you been able to return to your normal activities? Yes.    Do you have any questions about your discharge instructions: Diet   No. Medications  No. Follow up visit  No.  Do you have questions or concerns about your Care? No.  Actions: * If pain score is 4 or above: No action needed, pain <4.

## 2022-08-25 DIAGNOSIS — R32 Unspecified urinary incontinence: Secondary | ICD-10-CM | POA: Diagnosis not present

## 2022-08-25 DIAGNOSIS — E119 Type 2 diabetes mellitus without complications: Secondary | ICD-10-CM | POA: Diagnosis not present

## 2022-08-25 DIAGNOSIS — I10 Essential (primary) hypertension: Secondary | ICD-10-CM | POA: Diagnosis not present

## 2022-08-31 ENCOUNTER — Ambulatory Visit (INDEPENDENT_AMBULATORY_CARE_PROVIDER_SITE_OTHER): Payer: Medicaid Other | Admitting: Student

## 2022-08-31 ENCOUNTER — Other Ambulatory Visit: Payer: Self-pay

## 2022-08-31 ENCOUNTER — Encounter: Payer: Self-pay | Admitting: Student

## 2022-08-31 VITALS — BP 150/80 | HR 73 | Temp 97.6°F | Ht 73.0 in | Wt 300.6 lb

## 2022-08-31 DIAGNOSIS — E1142 Type 2 diabetes mellitus with diabetic polyneuropathy: Secondary | ICD-10-CM | POA: Diagnosis not present

## 2022-08-31 DIAGNOSIS — I4811 Longstanding persistent atrial fibrillation: Secondary | ICD-10-CM | POA: Diagnosis not present

## 2022-08-31 DIAGNOSIS — Z794 Long term (current) use of insulin: Secondary | ICD-10-CM | POA: Diagnosis not present

## 2022-08-31 DIAGNOSIS — Z659 Problem related to unspecified psychosocial circumstances: Secondary | ICD-10-CM

## 2022-08-31 DIAGNOSIS — K2211 Ulcer of esophagus with bleeding: Secondary | ICD-10-CM

## 2022-08-31 DIAGNOSIS — I5042 Chronic combined systolic (congestive) and diastolic (congestive) heart failure: Secondary | ICD-10-CM | POA: Diagnosis not present

## 2022-08-31 LAB — POCT GLYCOSYLATED HEMOGLOBIN (HGB A1C): Hemoglobin A1C: 12.6 % — AB (ref 4.0–5.6)

## 2022-08-31 LAB — GLUCOSE, CAPILLARY: Glucose-Capillary: 387 mg/dL — ABNORMAL HIGH (ref 70–99)

## 2022-08-31 NOTE — Assessment & Plan Note (Signed)
Poorly controlled.  On daily SGLT2 inhibitor and long-acting insulin.  Blood sugar close to 400 today.  A1c 12.6%.  CGM suggests overnight highs and postprandial highs around mid afternoon.  Coincides with this person's habit of snacking through bedtime around 2 to 3 AM and having 1 large meal of the day between 12 and 3 PM.  He would benefit from twice daily long-acting insulin and mealtime insulin, but he is resistant to these changes at this time.  Would prefer to continue current regimen and try lifestyle changes. - Insulin glargine 52 units daily - Empagliflozin 25 mg daily

## 2022-08-31 NOTE — Assessment & Plan Note (Signed)
Poor adherence to medical therapy secondary to poor understanding of the indications and recent mood issues.  He is spoken with a counselor about mood issues, denies further counseling or treatment for this today.  Suspect he feels a lack of agency regarding his medical treatment.  Insists that he will improve his lifestyle and get things under control without changes to his regimen.  Recommend close follow-up and continued attempts to foster buy-in from this person.

## 2022-08-31 NOTE — Patient Instructions (Addendum)
Today we discussed diabetes and heart failure.  I worry about these chronic conditions causing hospitalizations and severe illness in the near future.  Restart your medicines and bring them to you next appointment.  Return in 2 weeks, for blood pressure, diabetes, and medication reconciliation.   I will call you with the results of the following laboratory test(s):  Lab Orders         BMP8+Anion Gap         Glucose, capillary         POC Hbg A1C      Please call our clinic at [phone] Monday through Friday from 9 am to 4 pm if you have questions or concerns about your health. If after hours or on the weekend, call the main hospital number and ask for the Internal Medicine Resident On-Call. If you need medication refills, please notify your pharmacy one week in advance and they will send [place] a request.   Best, Marrianne Mood, MD [facility]

## 2022-08-31 NOTE — Assessment & Plan Note (Signed)
Reports that he has restarted his Eliquis per instructions after EGD on 08/20/2022.

## 2022-08-31 NOTE — Assessment & Plan Note (Signed)
Status post EGD 08/20/2022.  Recommended to continue twice daily PPI and 4 times daily Carafate.  Has not picked up the latter.  Thinks that Terry Parks is getting his esomeprazole and his pill packs.  No more episodes of nausea, vomiting, or bleeding.  Esophageal pathology notable for cardiac mucosa without intestinal metaplasia or dysplasia.  Per Society guidelines this is not frankly Barrett's, but presumably does increase his risk for esophageal cancer.  Close follow-up with GI, appointments scheduled for June and July of this year. - Esomeprazole 40 mg twice daily - Carafate 4 times daily

## 2022-08-31 NOTE — Progress Notes (Signed)
Subjective:  [name] is a 67 y.o. who presents to clinic for the following:  Follow-up (Routine office visit to follow up form last visit / dm / chronic back and leg pain)  S/p EGD, hasn't picked up carafate Restarted eliquis [facility]  Poor adherence to medications overall since his procedure.  He describes being somewhat out of sorts since then.  ROS Positive for leg swelling and fatigue Negative for dyspnea Negative for nausea, vomiting, hematemesis  Objective:   Vitals:   08/31/22 0956  BP: (!) 150/80  Pulse: 73  Temp: 97.6 F (36.4 C)  TempSrc: Oral  SpO2: 100%  Weight: (!) 300 lb 9 oz (136.3 kg)  Height:  (1.854 m)    Physical Exam Tired appearing Heart rate normal, rhythm regular, strong radial pulses, bilateral lower extremity pitting edema halfway to knees Breathing is regular and unlabored on room air, no crackles or wheezes Skin is warm and dry Alert and oriented Frustrated, fidgety, concordant affect  Assessment & Plan:  The primary encounter diagnosis was Type 2 diabetes mellitus with peripheral neuropathy. Diagnoses of Chronic combined systolic and diastolic CHF (congestive heart failure), Ulcer of esophagus with bleeding, Longstanding persistent atrial fibrillation, and Psychosocial problem were also pertinent to this visit.  Ulcer of esophagus with bleeding Status post EGD 08/20/2022.  Recommended to continue twice daily PPI and 4 times daily Carafate.  Has not picked up the latter.  Thinks that he is getting his esomeprazole and his pill packs.  No more episodes of nausea, vomiting, or bleeding.  Esophageal pathology notable for cardiac mucosa without intestinal metaplasia or dysplasia.  Per Society guidelines this is not frankly Barrett's, but presumably does increase his risk for esophageal cancer.  Close follow-up with GI, appointments scheduled for June and July of this year. -  Esomeprazole 40 mg twice daily - Carafate 4 times daily  Type 2 diabetes mellitus with peripheral neuropathy (HCC) Poorly controlled.  On daily SGLT2 inhibitor and long-acting insulin.  Blood sugar close to 400 today.  A1c 12.6%.  CGM suggests overnight highs and postprandial highs around mid afternoon.  Coincides with this person's habit of snacking through bedtime around 2 to 3 AM and having 1 large meal of the day between 12 and 3 PM.  He would benefit from twice daily long-acting insulin and mealtime insulin, but he is resistant to these changes at this time.  Would prefer to continue current regimen and try lifestyle changes. - Insulin glargine 52 units daily - Empagliflozin 25 mg daily  Chronic combined systolic and diastolic CHF (congestive heart failure) (HCC) Hypertensive and with signs of volume overload on exam today.  However he does not report new or worsening heart failure symptoms.  Does not appear to be in a low output state.  No respiratory compromise.  He acknowledges that he has been nonadherent to medications recently.  Attributes this to lassitude that he is struggled with since the EGD.  Strongly encouraged him to restart his medications.  Want him to weigh himself daily.  Will check a BMP today and follow-up in a couple weeks.  Longstanding persistent atrial fibrillation (HCC) Reports that he has restarted his Eliquis per instructions after EGD on 08/20/2022.  Psychosocial problem Poor adherence to medical therapy secondary to poor understanding of the indications and recent mood issues.  He is spoken with a counselor about mood issues, denies further counseling or treatment for this today.  Suspect  he feels a lack of agency regarding his medical treatment.  Insists that he will improve his lifestyle and get things under control without changes to his regimen.  Recommend close follow-up and continued attempts to foster buy-in from this person.    Return in 2 weeks, for blood  pressure, diabetes, and medication reconciliation.  Patient discussed with Dr. Elige Ko MD 08/31/2022, 1:40 PM  Pager: [phone]

## 2022-08-31 NOTE — Assessment & Plan Note (Signed)
Hypertensive and with signs of volume overload on exam today.  However he does not report new or worsening heart failure symptoms.  Does not appear to be in a low output state.  No respiratory compromise.  He acknowledges that he has been nonadherent to medications recently.  Attributes this to lassitude that he is struggled with since the EGD.  Strongly encouraged him to restart his medications.  Want him to weigh himself daily.  Will check a BMP today and follow-up in a couple weeks.

## 2022-09-01 LAB — BMP8+ANION GAP
Anion Gap: 13 mmol/L (ref 10.0–18.0)
BUN/Creatinine Ratio: 8 — ABNORMAL LOW (ref 10–24)
BUN: 10 mg/dL (ref 8–27)
CO2: 22 mmol/L (ref 20–29)
Calcium: 8.6 mg/dL (ref 8.6–10.2)
Chloride: 102 mmol/L (ref 96–106)
Creatinine, Ser: 1.26 mg/dL (ref 0.76–1.27)
Glucose: 364 mg/dL — ABNORMAL HIGH (ref 70–99)
Potassium: 4 mmol/L (ref 3.5–5.2)
Sodium: 137 mmol/L (ref 134–144)
eGFR: 64 mL/min/{1.73_m2} (ref >59)

## 2022-09-01 NOTE — Addendum Note (Signed)
Addended by: Marrianne Mood on: 09/01/2022 08:17 AM   Modules accepted: Level of Service

## 2022-09-01 NOTE — Progress Notes (Signed)
Called and discussed results of blood tests.  A1c is 12.6.  Also dropped friendly reminder to attend follow-up clinic visit in 2 weeks for blood pressure check and CBG check.

## 2022-09-02 ENCOUNTER — Other Ambulatory Visit (HOSPITAL_COMMUNITY): Payer: Self-pay

## 2022-09-02 NOTE — Progress Notes (Signed)
Internal Medicine Clinic Attending  Case discussed with the resident at the time of the visit.  We reviewed the resident's history and exam and pertinent patient test results.  I agree with the assessment, diagnosis, and plan of care documented in the resident's note.  

## 2022-09-03 ENCOUNTER — Encounter: Payer: Self-pay | Admitting: Gastroenterology

## 2022-09-14 ENCOUNTER — Other Ambulatory Visit (HOSPITAL_COMMUNITY): Payer: Self-pay

## 2022-09-14 ENCOUNTER — Ambulatory Visit (INDEPENDENT_AMBULATORY_CARE_PROVIDER_SITE_OTHER): Payer: Medicaid Other | Admitting: Student

## 2022-09-14 ENCOUNTER — Other Ambulatory Visit: Payer: Self-pay

## 2022-09-14 ENCOUNTER — Encounter: Payer: Self-pay | Admitting: Student

## 2022-09-14 VITALS — BP 130/83 | HR 72 | Temp 97.8°F | Ht 73.0 in | Wt 309.8 lb

## 2022-09-14 DIAGNOSIS — I5042 Chronic combined systolic (congestive) and diastolic (congestive) heart failure: Secondary | ICD-10-CM

## 2022-09-14 DIAGNOSIS — Z9079 Acquired absence of other genital organ(s): Secondary | ICD-10-CM

## 2022-09-14 DIAGNOSIS — K2211 Ulcer of esophagus with bleeding: Secondary | ICD-10-CM

## 2022-09-14 NOTE — Patient Instructions (Addendum)
It was a pleasure seeing you in clinic today We completed incontinence supply paperwork.   Please restart taking your medications.  Pick up omeprazole and sucralfate from the [facility]  Please restart your home medications and bring you pill pack to your next visit  If still having significant swelling in the legs or shortness of breath please call us as we may need to increase you diuretic medications  Follow up in 4 weeks

## 2022-09-16 NOTE — Assessment & Plan Note (Signed)
Visit today for DME supplies for incontinence due to complications following TURP for prostate cancer in 2019.  Is doing well with adult depends, chucks, and gloves. Paperwork completed in office for him.

## 2022-09-16 NOTE — Assessment & Plan Note (Signed)
Still has not picked up PPI or carafate. Reminded him to pick these up at the pharmacy.

## 2022-09-16 NOTE — Assessment & Plan Note (Addendum)
Has not taken his torsemide in a week due to difficulties with his home medication delivery. Received medications today. 2+ Le edema.  Appears fluid overloaded, not in acute exacerbation. He will restart home medications and call if symptoms are worsening.

## 2022-09-16 NOTE — Progress Notes (Signed)
Established Patient Office Visit  Subjective   Patient ID: Terry Parks, male    DOB: [birth year]  Age: 67 y.o. MRN: [id number]  Chief Complaint  Patient presents with   Follow-up    To discuss incontin supplies  / chronic foot pain # 6    Terry Parks is a 67 y.o. person living with a history listed below who presents to clinic for follow up of incontinence supplies. Please refer to problem based charting for further details and assessment and plan of current problem and chronic medical conditions.     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   Cardiovascular:  Positive for leg swelling. Negative for chest pain, palpitations and orthopnea.  Genitourinary:  Negative for dysuria and urgency.  All other systems reviewed and are negative.     Objective:     BP 130/83 (BP Location: Right Arm, Patient  Position: Sitting, Cuff Size: Normal)   Pulse 72   Temp 97.8 F (36.6 C) (Oral)   Ht 6\' 1"  (1.854 m)   Wt (!) 309 lb 12.8 oz (140.5 kg)   SpO2 99%   BMI 40.87 kg/m  BP Readings from Last 3 Encounters:  09/14/22 130/83  08/31/22 (!) 150/80  08/20/22 (!) 145/99      Physical Exam Constitutional:      Appearance: Normal appearance.  HENT:     Head: Normocephalic and atraumatic.     Mouth/Throat:     Mouth: Mucous membranes are moist.     Pharynx: Oropharynx is clear.  Eyes:     Extraocular Movements: Extraocular movements intact.     Conjunctiva/sclera: Conjunctivae normal.     Pupils: Pupils are equal, round, and reactive to light.  Cardiovascular:     Rate and Rhythm: Normal rate and regular rhythm.     Heart sounds: No murmur heard.    Comments: Unable to assess JVD due to habitus  Pulmonary:     Effort: Pulmonary effort is normal.     Breath sounds: Normal breath sounds. No rhonchi or rales.  Abdominal:     General: Abdomen is flat. Bowel sounds are normal. There is no distension.     Palpations: Abdomen is soft.     Tenderness: There is no abdominal tenderness.  Musculoskeletal:        General: Normal range of motion.     Right lower leg: Edema (2+) present.     Left lower  leg: Edema (2+) present.  Skin:    General: Skin is warm and dry.     Capillary Refill: Capillary refill takes less than 2 seconds.  Neurological:     General: No focal deficit present.     Mental Status: He is alert and oriented to person, place, and time.  Psychiatric:        Mood and Affect: Mood normal.        Behavior: Behavior normal.     No results found for any visits on 09/14/22.     The ASCVD Risk score (Arnett DK, et al., 2019) failed to calculate for the following reasons:   The valid total cholesterol range is 130 to 320 mg/dL    Assessment & Plan:   Problem List Items Addressed This Visit     Chronic combined systolic and diastolic CHF (congestive heart failure)  (HCC) (Chronic)    Has not taken his torsemide in a week due to difficulties with his home medication delivery. Received medications today. 2+ Le edema.  Appears fluid overloaded, not in acute exacerbation. He will restart home medications and call if symptoms are worsening.       S/P TURP - Primary (Chronic)    Visit today for DME supplies for incontinence due to complications following TURP for prostate cancer in 2019.  Is doing well with adult depends, chucks, and gloves. Paperwork completed in office for him.       Relevant Orders   Ambulatory Referral for DME   Ulcer of esophagus with bleeding    Still has not picked up PPI or carafate. Reminded him to pick these up at the pharmacy.        Return in about 4 weeks (around 10/12/2022).    Terry Simmonds, MD

## 2022-09-17 NOTE — Progress Notes (Signed)
Internal Medicine Clinic Attending  Case discussed with the resident at the time of the visit.  We reviewed the resident's history and exam and pertinent patient test results.  I agree with the assessment, diagnosis, and plan of care documented in the resident's note.  

## 2022-09-20 ENCOUNTER — Telehealth: Payer: Self-pay

## 2022-09-20 NOTE — Telephone Encounter (Signed)
Error

## 2022-09-22 LAB — HM DIABETES EYE EXAM

## 2022-09-27 ENCOUNTER — Other Ambulatory Visit: Payer: Medicaid Other | Admitting: Obstetrics and Gynecology

## 2022-09-27 NOTE — Patient Outreach (Signed)
  Medicaid Managed Care   Unsuccessful Attempt Note   09/27/2022 Name: Terry Parks MRN: [id number] DOB: 1955-09-07  Referred by: [name], MD Reason for referral : High Risk Managed Medicaid (Unsuccessful telephone outreach)  An unsuccessful telephone outreach was attempted today. The patient was referred to the case management team for assistance with care management and care coordination.    Follow Up Plan: The Managed Medicaid care management team will reach out to the patient again over the next 30 business  days. and The  Patient has been provided with contact information for the Managed Medicaid care management team and has been advised to call with any health related questions or concerns.   Kathi Der RN, BSN [place]  [employer] - Managed Medicaid High Risk [phone]

## 2022-09-27 NOTE — Patient Instructions (Signed)
Visit Information  Mr. [name]  - as a part of your Medicaid benefit, you are eligible for care management and care coordination services at no cost or copay. I was unable to reach you by phone today but would be happy to help you with your health related needs. Please feel free to call me [phone].   A member of the Managed Medicaid care management team will reach out to you again over the next 30 business  days.   Kathi Der RN, BSN [place]  [employer] - Managed Medicaid High Risk [phone]

## 2022-09-29 DIAGNOSIS — E119 Type 2 diabetes mellitus without complications: Secondary | ICD-10-CM | POA: Diagnosis not present

## 2022-09-29 DIAGNOSIS — R32 Unspecified urinary incontinence: Secondary | ICD-10-CM | POA: Diagnosis not present

## 2022-10-04 ENCOUNTER — Other Ambulatory Visit: Payer: Medicaid Other | Admitting: Obstetrics and Gynecology

## 2022-10-04 NOTE — Patient Instructions (Signed)
Hi Mr. Nutt, I hope you are doing okay, I have been unable to reach you  - as a part of your Medicaid benefit, you are eligible for care management and care coordination services at no cost or copay. I was unable to reach you by phone today but would be happy to help you with your health related needs. Please feel free to call me at [phone].  A member of the Managed Medicaid care management team will reach out to you again over the next 30 business days.   Kathi Der RN, BSN [place]  [employer] - Managed Medicaid High Risk [phone]

## 2022-10-04 NOTE — Patient Outreach (Signed)
  Medicaid Managed Care   Unsuccessful Attempt Note   10/04/2022 Name: Terrill Makovec MRN: [id number] DOB: Oct 02, 1955  Referred by: [name], MD Reason for referral : High Risk Managed Medicaid (Unsuccessful telephone outreach)  A second unsuccessful telephone outreach was attempted today. The patient was referred to the case management team for assistance with care management and care coordination.    Follow Up Plan: The Managed Medicaid care management team will reach out to the patient again over the next 30 business  days. and The  Patient has been provided with contact information for the Managed Medicaid care management team and has been advised to call with any health related questions or concerns.   Kathi Der RN, BSN   [employer] - Managed Medicaid High Risk [phone]

## 2022-10-11 ENCOUNTER — Encounter: Payer: Self-pay | Admitting: *Deleted

## 2022-10-12 ENCOUNTER — Telehealth: Payer: Self-pay

## 2022-10-12 ENCOUNTER — Ambulatory Visit (INDEPENDENT_AMBULATORY_CARE_PROVIDER_SITE_OTHER): Payer: Medicaid Other | Admitting: Student

## 2022-10-12 VITALS — BP 127/80 | HR 81 | Temp 97.6°F | Ht 73.0 in | Wt 283.9 lb

## 2022-10-12 DIAGNOSIS — G4733 Obstructive sleep apnea (adult) (pediatric): Secondary | ICD-10-CM | POA: Diagnosis not present

## 2022-10-12 DIAGNOSIS — E785 Hyperlipidemia, unspecified: Secondary | ICD-10-CM | POA: Diagnosis not present

## 2022-10-12 DIAGNOSIS — I5042 Chronic combined systolic (congestive) and diastolic (congestive) heart failure: Secondary | ICD-10-CM

## 2022-10-12 DIAGNOSIS — E1142 Type 2 diabetes mellitus with diabetic polyneuropathy: Secondary | ICD-10-CM

## 2022-10-12 DIAGNOSIS — Z794 Long term (current) use of insulin: Secondary | ICD-10-CM | POA: Diagnosis not present

## 2022-10-12 DIAGNOSIS — I4811 Longstanding persistent atrial fibrillation: Secondary | ICD-10-CM | POA: Diagnosis not present

## 2022-10-12 DIAGNOSIS — I152 Hypertension secondary to endocrine disorders: Secondary | ICD-10-CM | POA: Diagnosis not present

## 2022-10-12 DIAGNOSIS — E1159 Type 2 diabetes mellitus with other circulatory complications: Secondary | ICD-10-CM | POA: Diagnosis not present

## 2022-10-12 DIAGNOSIS — Z7901 Long term (current) use of anticoagulants: Secondary | ICD-10-CM | POA: Diagnosis not present

## 2022-10-12 DIAGNOSIS — E1169 Type 2 diabetes mellitus with other specified complication: Secondary | ICD-10-CM | POA: Diagnosis present

## 2022-10-12 MED ORDER — LANTUS SOLOSTAR 100 UNIT/ML ~~LOC~~ SOPN: 30.0000 [IU] | PEN_INJECTOR | Freq: Two times a day (BID) | SUBCUTANEOUS | 6 refills | Status: DC

## 2022-10-12 MED ORDER — APIXABAN 5 MG PO TABS: 5.0000 mg | ORAL_TABLET | Freq: Two times a day (BID) | ORAL | 11 refills | Status: DC

## 2022-10-12 MED ORDER — SITAGLIPTIN 50 MG PO TABS: 50.0000 mg | ORAL_TABLET | Freq: Every day | ORAL | 11 refills | Status: DC

## 2022-10-12 NOTE — Assessment & Plan Note (Addendum)
Diabetes is not well controlled. Average CBG is 349 on review of glucose meter. Current medications are lantus 52 units daily and jardiance 25 mg daily. Reports sugars are always high has he is stressed due to doctorate program and not eating well. 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 A1c has not been controlled in last 2 years, and worsening in the last year. Discussed the importance of good glycemic control and needed to increase his medications to help with this. He understands and is agreeable.  Increase lantus to 30 units twice daily  Start slitaglipin 50 mg daily Continue empagliflozin 25 mg daily Repeat A1c in 2 months.

## 2022-10-12 NOTE — Assessment & Plan Note (Signed)
Lipid profile today  

## 2022-10-12 NOTE — Telephone Encounter (Signed)
Prior Authorization for patient Terry Parks) came through on cover my meds was submitted with last office notes and labs awaiting approval or denial.

## 2022-10-12 NOTE — Patient Instructions (Signed)
It was a pleasure seeing you in clinic today. Today we discussed  Diabetes Your average blood sugars are very high Please continue working on your diet to help with this. Increase Lantus to 30 units twice a day I will refill your insulin pens Please start Januvia 50 mg daily  I will arrange for sleep study to see if sleep apnea may be contributing to your daytime sleepiness  Will check your lipid panel today  Follow-up in 1 to 2 months for diabetes

## 2022-10-12 NOTE — Assessment & Plan Note (Signed)
Was able to restart his torsemide.  Compliant with other GDMT.  Weight is down to 283 pounds and appears at baseline.  States orthopnea has resolved.  No lower extremity edema or JVD on exam.  Overall appears euvolemic.  Continue current medications.

## 2022-10-12 NOTE — Assessment & Plan Note (Signed)
History of severe OSA.  Has not used CPAP in several years.  Was having dental pain limiting his use of the mask, however this has resolved.  Does not have his CPAP machine anymore.  He does endorse increased daytime sleepiness and snoring.  Appears his last sleep study was in 2009.  Will send referral for sleep study for new CPAP titration.

## 2022-10-12 NOTE — Assessment & Plan Note (Signed)
Remains well-controlled on amlodipine 5 mg, carvedilol 25 mg twice daily, Entresto, spironolactone, and torsemide.

## 2022-10-12 NOTE — Telephone Encounter (Signed)
Decision:ApprovedFREDERICK Beaufort (KeyKizzie Furnish) PA Case ID #: [id number] Rx #: [id number] Need Help? Call us at [phone] Outcome Approved today Request Reference Number: [id number]. JANUVIA TAB 50MG  is approved through 10/12/2023. For further questions, call [employer] at [phone]. Authorization Expiration Date: 10/12/2023 Drug Januvia 50MG  tablets ePA cloud logo Form [employer] Electronic Prior Authorization Form [phone] N[place]) Original Claim Info [phone]  Approval has been faxed to the pharmacy.

## 2022-10-12 NOTE — Assessment & Plan Note (Signed)
Appears his Eliquis has not been filled recently.  [facility].  I do not see any refills being sent there lately.  He has not entirely certain what is contained in his pill pack, but reports he has been consistently taking medications in his pack.  No clinical signs or symptoms of VTE.  Refill Eliquis 5 mg twice daily.

## 2022-10-12 NOTE — Progress Notes (Signed)
Established Patient Office Visit  Subjective   Patient ID: Terry Parks, male    DOB: Sep 11, 1955  Age: 67 y.o. MRN: [id number]  Chief Complaint  Patient presents with   Follow-up   Diabetes    Lucia Schroeter is a 67 y.o. person living with a history listed below who presents to clinic for diabetes follow up. Please refer to problem based charting for further details and assessment and plan of current problem and chronic medical conditions.     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   Depression 01/31/2019   Iron deficiency anemia 04/28/2018   CKD stage 3 due to type 2 diabetes mellitus (HCC) 02/11/2018   Healthcare maintenance 02/11/2018   Prostate cancer (HCC) 10/06/2017   Longstanding persistent atrial fibrillation (HCC)    Vitamin D deficiency 06/22/2016   Allergic rhinitis 10/19/2012   Diabetic retinopathy (HCC) 10/04/2012   Type 2 diabetes mellitus with peripheral neuropathy (HCC) 04/08/2008   Obstructive sleep apnea    Hyperlipidemia associated with type 2 diabetes mellitus (HCC)    Hypertension associated with diabetes (HCC)    Chronic combined systolic and diastolic CHF (congestive heart failure) (HCC) 03/01/2006     ROS: negative as per HPI     Objective:     BP 127/80 (BP Location: Right Arm, Patient Position: Sitting, Cuff Size: Small)   Pulse 81   Temp 97.6 F (36.4 C) (Oral)   Ht 6\' 1"  (1.854 m)   Wt 283 lb 14.4 oz (128.8 kg)   SpO2 100%   BMI 37.46 kg/m  BP Readings from Last 3 Encounters:  10/12/22 127/80  09/14/22 130/83  08/31/22 (!) 150/80      Physical  Exam Constitutional:      Appearance: He is obese.  HENT:     Head: Normocephalic and atraumatic.     Mouth/Throat:     Mouth: Mucous membranes are moist.     Pharynx: Oropharynx is clear.  Eyes:     Extraocular Movements: Extraocular movements intact.     Conjunctiva/sclera: Conjunctivae normal.     Pupils: Pupils are equal, round, and reactive to light.  Cardiovascular:     Rate and Rhythm: Normal rate and regular rhythm.     Comments: No JVD Pulmonary:     Effort: Pulmonary effort is normal.     Breath sounds: No rhonchi or rales.  Abdominal:     General: Abdomen is flat. Bowel sounds are normal. There is no distension.     Palpations: Abdomen is soft.     Tenderness: There is no abdominal tenderness.  Musculoskeletal:        General: Normal range of motion.     Right lower leg: No edema.     Left lower leg: No edema.  Skin:    General: Skin is warm and dry.     Capillary Refill: Capillary refill takes less than 2 seconds.     Comments: Dry skin on bilateral heels  Neurological:     General: No focal deficit present.     Mental Status: He is alert and oriented to person, place, and time.  Psychiatric:  Mood and Affect: Mood normal.        Behavior: Behavior normal.      No results found for any visits on 10/12/22.  Last hemoglobin A1c Lab Results  Component Value Date   HGBA1C 12.6 (A) 08/31/2022      The ASCVD Risk score (Arnett DK, et al., 2019) failed to calculate for the following reasons:   The valid total cholesterol range is 130 to 320 mg/dL    Assessment & Plan:   Problem List Items Addressed This Visit     Chronic combined systolic and diastolic CHF (congestive heart failure) (HCC) (Chronic)    Was able to restart his torsemide.  Compliant with other GDMT.  Weight is down to 283 pounds and appears at baseline.  States orthopnea has resolved.  No lower extremity edema or JVD on exam.  Overall appears euvolemic.  Continue current  medications.      Relevant Medications   apixaban (ELIQUIS) 5 MG TABS tablet   Hyperlipidemia associated with type 2 diabetes mellitus (HCC) - Primary (Chronic)    Lipid profile today.      Relevant Medications   apixaban (ELIQUIS) 5 MG TABS tablet   insulin glargine (LANTUS SOLOSTAR) 100 UNIT/ML Solostar Pen   SITagliptin 50 MG TABS   Other Relevant Orders   Lipid Profile   Hypertension associated with diabetes (HCC) (Chronic)    Remains well-controlled on amlodipine 5 mg, carvedilol 25 mg twice daily, Entresto, spironolactone, and torsemide.      Relevant Medications   apixaban (ELIQUIS) 5 MG TABS tablet   insulin glargine (LANTUS SOLOSTAR) 100 UNIT/ML Solostar Pen   SITagliptin 50 MG TABS   Obstructive sleep apnea (Chronic)    History of severe OSA.  Has not used CPAP in several years.  Was having dental pain limiting his use of the mask, however this has resolved.  Does not have his CPAP machine anymore.  He does endorse increased daytime sleepiness and snoring.  Appears his last sleep study was in 2009.  Will send referral for sleep study for new CPAP titration.      Relevant Orders   Ambulatory referral to Sleep Studies   Type 2 diabetes mellitus with peripheral neuropathy (HCC) (Chronic)    Diabetes is not well controlled. Average CBG is 349 on review of glucose meter. Current medications are lantus 52 units daily and jardiance 25 mg daily. Reports sugars are always high has he is stressed due to doctorate program and not eating well. 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 A1c has not been controlled in last 2 years, and worsening in the last year. Discussed the importance of good glycemic control and needed to increase his medications to help with this. He understands and is agreeable.  Increase lantus to 30 units twice daily  Start slitaglipin 50 mg daily Continue empagliflozin 25 mg  daily Repeat A1c in 2 months.      Relevant Medications   insulin glargine (LANTUS SOLOSTAR) 100 UNIT/ML Solostar Pen   SITagliptin 50 MG TABS   Longstanding persistent atrial fibrillation (HCC)    Appears his Eliquis has not been filled recently.  [facility].  I do not see any refills being sent there lately.  He has not entirely certain what is contained in his pill pack, but reports he has been consistently taking medications in his pack.  No clinical signs or  symptoms of VTE.  Refill Eliquis 5 mg twice daily.      Relevant Medications   apixaban (ELIQUIS) 5 MG TABS tablet    Return in about 2 months (around 12/12/2022) for diabetes .    [name], MD

## 2022-10-13 ENCOUNTER — Telehealth: Payer: Self-pay

## 2022-10-13 LAB — LIPID PANEL
Chol/HDL Ratio: 5.8 ratio — ABNORMAL HIGH (ref 0.0–5.0)
Cholesterol, Total: 133 mg/dL (ref 100–199)
HDL: 23 mg/dL — ABNORMAL LOW (ref >39)
LDL Chol Calc (NIH): 64 mg/dL (ref 0–99)
Triglycerides: 285 mg/dL — ABNORMAL HIGH (ref 0–149)
VLDL Cholesterol Cal: 46 mg/dL — ABNORMAL HIGH (ref 5–40)

## 2022-10-13 NOTE — Progress Notes (Signed)
Internal Medicine Clinic Attending ? ?Case discussed with Dr. Liang  At the time of the visit.  We reviewed the resident?s history and exam and pertinent patient test results.  I agree with the assessment, diagnosis, and plan of care documented in the resident?s note. ? ?

## 2022-10-13 NOTE — Telephone Encounter (Signed)
..   Medicaid Managed Care   Unsuccessful Outreach Note  10/13/2022 Name: Terry Parks MRN: [id number] DOB: [birth year]  Referred by: Olegario Messier, MD Reason for referral : Appointment (I called [name] to reschedule his missed phone appointments with the [facility] RNCM. He did not answer today but I was able to leave my name and number on his voicemail.)   Third unsuccessful telephone outreach was attempted today. The patient was referred to the case management team for assistance with care management and care coordination. The patient's primary care provider has been notified of our unsuccessful attempts to make or maintain contact with the patient. The care management team is pleased to engage with this patient at any time in the future should he/she be interested in assistance from the care management team.   Follow Up Plan: We have been unable to make contact with the patient for follow up. The care management team is available to follow up with the patient after provider conversation with the patient regarding recommendation for care management engagement and subsequent re-referral to the care management team.   Weston Settle Care Guide  [facility] Managed  Care Guide [facility]  [phone]

## 2022-10-15 ENCOUNTER — Encounter: Payer: Self-pay | Admitting: Student

## 2022-10-26 ENCOUNTER — Other Ambulatory Visit: Payer: Medicare Other | Admitting: Obstetrics and Gynecology

## 2022-10-26 NOTE — Patient Outreach (Signed)
Care Coordination  10/26/2022  Terry Parks 05/27/1955 [id number]    Care Management/Care Coordination  RN Case Manager Case Closure Note  10/26/2022 Name: Terry Parks MRN: [id number] DOB: 08/29/1955  Terry Parks is a 67 y.o. year old male who is a primary care patient of Nooruddin, Jason Fila, MD. The care management/care coordination team was consulted for assistance with chronic disease management and/or care coordination needs.   Care Plan : CCM RN- Heart Failure (Adult), HTN, PAF. CKD  Updates made by [name], RN since 10/26/2022 12:00 AM  Completed 10/26/2022   Care Plan : CCM RN- Diabetes Type 2 (Adult)  Updates made by [name], RN since 10/26/2022 12:00 AM     Problem: Glycemic Management (Diabetes, Type 2) Resolved 10/26/2022  Priority: High  Onset Date: 06/03/2020  Note:   10/26/22:  Unable to maintain contact with patient    Long-Range Goal: Glycemic Management Optimized Completed 10/26/2022  Start Date: 07/20/2019  Expected End Date: 11/08/2022  Recent Progress: Not on track  Priority: High  Note:    CARE PLAN ENTRY (see longitudinal plan of care for additional care plan information)  Current Barriers:  Knowledge Deficits related to basic Diabetes pathophysiology and self care/management Knowledge Deficits related to medications used for management of diabetes 10/26/22:  Unable to maintain contact with patient  Case Manager Clinical Goal(s):  Over the next 30-60  days, patient will demonstrate improved adherence to prescribed treatment plan for diabetes self care/management as evidenced by:  daily monitoring and recording of CBG  adherence to ADA/ carb modified diet adherence to prescribed medication regimen  Interventions:  Reviewed education with patient about basic DM disease process Reviewed medications and assessed medication taking behavior Ensured patient has ample supply of all prescribed medications Advised patient, providing  education and rationale, to check CBG at least daily and record, calling provider and/or CCM RN for findings outside established parameters. Educated patient that leg and foot pain will likely decrease if blood sugar control is improved and a CGM could provide that assistance  Discussed plans with patient for ongoing care management follow up and provided patient with direct contact information for care management team Review of patient status, including review of consultants reports, relevant laboratory and other test results, and medications completed. Collaborated with [facility] for insulin. 04/16/22:  Patient provided with [facility] phone number.  Patient Self Care Activities:  UNABLE to independently self manage DM Self administers oral medications as prescribed Self administers insulin as prescribed Self administers injectable DM medication Xultophy as prescribed Attends all scheduled provider appointments Checks blood sugars as prescribed and utilize hyper and hypoglycemia protocol as needed Adheres to prescribed ADA/carb modified check blood sugar at prescribed times - check blood sugar if I feel it is too high or too low - enter blood sugar readings and medication or insulin into my phone log - take the blood sugar log to all doctor visits - take the blood sugar meter to all doctor visits     Care Plan : CCM RN- Chronic Pain (Adult)- lower extremeties  Updates made by [name], RN since 10/26/2022 12:00 AM  Completed 10/26/2022   Problem: Chronic Pain Management (Chronic Pain) Resolved 10/26/2022  Priority: High  Onset Date: 07/15/2020  Note:   10/26/22:  Unable to maintain contact with patient    Long-Range Goal: Chronic Pain Managed Completed 10/26/2022  Start Date: 07/15/2020  Expected End Date: 11/12/2022  Recent Progress: On track  Priority: High  Note:  Current Barriers:  Knowledge Deficits related to self-health management of chronic lower extremity pain Chronic  Disease Management support and education needs related to chronic pain 10/26/22:  Unable to maintain contact with patient  Clinical Goal(s):  patient will verbalize understanding of plan for pain management.  and patient will use pharmacological and nonpharmacological pain relief strategies as prescribed.   Interventions:  Pain assessment performed Medications reviewed Discussed plans with patient for ongoing care management follow up and provided patient with direct contact information for care management team Discussed improvement  in DM control will likely result in less pain in legs and feet   Patient Goals/Self Care Activities:  Will self-administer medications as prescribed Will attend all scheduled provider appointments Will call pharmacy for medication refills 7 days prior to needed refill date Patient will calls provider office for new concerns or questions Patient will get appt scheduled with general surgeon Follow Up Plan: The care management team will reach out to the patient again over the next 30-60 days.       Care Plan : General Plan of Care (Adult)  Updates made by [name], RN since 10/26/2022 12:00 AM  Completed 10/26/2022   Problem: Health Promotion or Disease Self-Management (General Plan of Care) Resolved 10/26/2022  Priority: Medium  Onset Date: 10/07/2020  Note:   10/26/22:  Unable to maintain contact with patient    Long-Range Goal: Self-Management Plan Developed Completed 10/26/2022  Start Date: 10/07/2020  Expected End Date: 11/12/2022  Recent Progress: On track  Priority: Medium  Note:   Current Barriers:  Ineffective Self Health Maintenance Currently UNABLE TO independently self manage needs related to chronic health conditions, HTN, DM, HF, OSA, vision loss, PAF, HLD, CKD, chronic pain Knowledge Deficits related to short term plan for care coordination needs and long term plans for chronic disease management needs 10/26/22:  Unable to maintain  contact with patient  Nurse Case Manager Clinical Goal(s):  patient will work with care management team to address care coordination and chronic disease management needs related to Disease Management Educational Needs Care Coordination Medication Management and Education Medication Reconciliation Medication Assistance  Psychosocial Support    Heart Failure Interventions:  (Status:  New goal.) Long Term Goal Provided education on low sodium diet Assessed need for readable accurate scales in home Provided education about placing scale on hard, flat surface Advised patient to weigh each morning after emptying bladder Discussed importance of daily weight and advised patient to weigh and record daily Reviewed role of diuretics in prevention of fluid overload and management of heart failure; Discussed the importance of keeping all appointments with provider Assessed social determinant of health barriers   Hyperlipidemia Interventions:  (Status:  New goal.) Long Term Goal Medication review performed; medication list updated in electronic medical record.  Provider established cholesterol goals reviewed Counseled on importance of regular laboratory monitoring as prescribed Discussed strategies to manage statin-induced myalgias Reviewed importance of limiting foods high in cholesterol Reviewed exercise goals and target of 150 minutes per week Assessed social determinant of health barriers   Hypertension Interventions:  (Status:  New goal.) Long Term Goal Last practice recorded BP readings:  BP Readings from Last 3 Encounters:  07/08/22 136/85  06/24/22 (!) 163/97  05/13/22 124/79  08/12/22         114/79 Most recent eGFR/CrCl:  Lab Results  Component Value Date   EGFR 65 04/27/2022    No components found for: "CRCL"  Evaluation of current treatment plan related to hypertension  self management and patient's adherence to plan as established by provider Reviewed medications with  patient and discussed importance of compliance Counseled on the importance of exercise goals with target of 150 minutes per week Discussed plans with patient for ongoing care management follow up and provided patient with direct contact information for care management team Advised patient, providing education and rationale, to monitor blood pressure daily and record, calling PCP for findings outside established parameters Reviewed scheduled/upcoming provider appointments including:  Discussed complications of poorly controlled blood pressure such as heart disease, stroke, circulatory complications, vision complications, kidney impairment, sexual dysfunction Assessed social determinant of health barriers  Interventions:  Evaluation of current treatment plan and patient's adherence to plan as established by provider. Reviewed medications with patient. Collaborated with pharmacy regarding referrals. Collaborated with PCP for referrals-completed Collaborated with Care Guide for eyeglass provider that accepts patient's insurance-completed Care Guide referral for eyeglass provider-completed Discussed plans with patient for ongoing care management follow up and provided patient with direct contact information for care management team Advised patient, providing education and rationale, to monitor blood pressure daily and record, calling provider for findings outside established parameters.  Reviewed scheduled/upcoming provider appointments. Collaborated with PCP for general surgeon and podiatry referral-completed Advised patient, providing education and rationale, to check cbg and record, calling provider for findings outside established parameters.   Pharmacy referral for medication review. Advised patient to contact insurance company regarding resumption of aide service-completed Care Guide referral for first time home buyer resources-completed Collaborated with Care Guide for first time home buyer  resources Collaborated with [facility] Self Care Activities:  Patient will self administer medications as prescribed Patient will attend all scheduled provider appointments Patient will call pharmacy for medication refills Patient will continue to perform ADL's independently Patient will call provider office for new concerns or questions Patient Goals:. In the next 30 days, patient will attend all appointments. In the next 30 days, patient will follow up on meter. In the next 30 days, patient will call insurance company and surgeon to schedule an appt. - Follow Up Plan: The patient has been provided with contact information for the care management team and has been advised to call with any health related questions or concerns.  The care management team will reach out to the patient again over the next 45 business  days.     Plan: We have been unable to make contact with the patient for follow up. The care management team is available to follow up with the patient should new care management/care coordination needs arise.   Kathi Der RN, BSN [place]  [employer] - Managed Medicaid High Risk [phone]

## 2022-10-28 ENCOUNTER — Other Ambulatory Visit: Payer: Self-pay

## 2022-10-28 MED ORDER — OXYBUTYNIN CHLORIDE ER 15 MG PO TB24
15.0000 mg | ORAL_TABLET | Freq: Every day | ORAL | 3 refills | Status: DC
  Filled 2022-10-28: qty 90, 90d supply, fill #0
  Filled 2022-11-22: qty 30, 30d supply, fill #0

## 2022-10-28 MED ORDER — CIPROFLOXACIN HCL 500 MG PO TABS
500.0000 mg | ORAL_TABLET | Freq: Two times a day (BID) | ORAL | 0 refills | Status: DC
  Filled 2022-10-28 – 2022-11-22 (×2): qty 6, 3d supply, fill #0

## 2022-10-28 MED ORDER — MIRABEGRON ER 50 MG PO TB24
50.0000 mg | ORAL_TABLET | Freq: Every day | ORAL | 3 refills | Status: DC
  Filled 2022-10-28 – 2022-11-22 (×2): qty 30, 30d supply, fill #0

## 2022-10-29 ENCOUNTER — Other Ambulatory Visit: Payer: Self-pay

## 2022-11-02 ENCOUNTER — Telehealth: Payer: Self-pay

## 2022-11-02 NOTE — Telephone Encounter (Signed)
Dr. Meridee Score,  [facility] hold that this patient had for his procedure in 08/2022- is that hold for 2 days still useable for the EGD scheduled with you on 11/24/2022? Please advise  If the Elquis hold for 2 days prior cannot be used, will you please forward to your RN or CMA so this hold can be obtained prior to his PV scheduled on 11/10/2022 in [facility]?  Please/thank you Bre

## 2022-11-02 NOTE — Telephone Encounter (Signed)
I am OK with this hold. GM

## 2022-11-02 NOTE — Telephone Encounter (Signed)
Noted on PV chart ?

## 2022-11-03 ENCOUNTER — Other Ambulatory Visit: Payer: Self-pay

## 2022-11-04 ENCOUNTER — Other Ambulatory Visit: Payer: Self-pay

## 2022-11-09 ENCOUNTER — Other Ambulatory Visit: Payer: Self-pay | Admitting: Student

## 2022-11-09 ENCOUNTER — Other Ambulatory Visit: Payer: Self-pay | Admitting: Cardiovascular Disease

## 2022-11-09 DIAGNOSIS — E1142 Type 2 diabetes mellitus with diabetic polyneuropathy: Secondary | ICD-10-CM

## 2022-11-09 DIAGNOSIS — I4819 Other persistent atrial fibrillation: Secondary | ICD-10-CM

## 2022-11-10 ENCOUNTER — Ambulatory Visit (AMBULATORY_SURGERY_CENTER): Payer: Medicaid Other

## 2022-11-10 VITALS — Ht 73.0 in | Wt 280.0 lb

## 2022-11-10 DIAGNOSIS — K2210 Ulcer of esophagus without bleeding: Secondary | ICD-10-CM

## 2022-11-10 DIAGNOSIS — K920 Hematemesis: Secondary | ICD-10-CM

## 2022-11-10 DIAGNOSIS — K209 Esophagitis, unspecified without bleeding: Secondary | ICD-10-CM

## 2022-11-10 DIAGNOSIS — K2211 Ulcer of esophagus with bleeding: Secondary | ICD-10-CM

## 2022-11-10 NOTE — Progress Notes (Signed)

## 2022-11-22 ENCOUNTER — Other Ambulatory Visit (HOSPITAL_COMMUNITY): Payer: Self-pay

## 2022-11-24 ENCOUNTER — Ambulatory Visit: Payer: Medicare Other | Admitting: Gastroenterology

## 2022-11-24 ENCOUNTER — Other Ambulatory Visit (HOSPITAL_COMMUNITY): Payer: Self-pay

## 2022-11-24 ENCOUNTER — Encounter: Payer: Self-pay | Admitting: Gastroenterology

## 2022-11-24 ENCOUNTER — Other Ambulatory Visit: Payer: Self-pay

## 2022-11-24 VITALS — BP 148/91 | HR 67 | Temp 98.7°F | Resp 18 | Ht 73.0 in | Wt 280.0 lb

## 2022-11-24 DIAGNOSIS — K2210 Ulcer of esophagus without bleeding: Secondary | ICD-10-CM

## 2022-11-24 DIAGNOSIS — K219 Gastro-esophageal reflux disease without esophagitis: Secondary | ICD-10-CM

## 2022-11-24 DIAGNOSIS — K920 Hematemesis: Secondary | ICD-10-CM

## 2022-11-24 DIAGNOSIS — K209 Esophagitis, unspecified without bleeding: Secondary | ICD-10-CM

## 2022-11-24 MED ORDER — DEXLANSOPRAZOLE 30 MG PO CPDR
30.0000 mg | DELAYED_RELEASE_CAPSULE | Freq: Two times a day (BID) | ORAL | 12 refills | Status: AC
  Filled 2022-11-24 – 2023-07-15 (×2): qty 60, 30d supply, fill #0

## 2022-11-24 MED ORDER — SODIUM CHLORIDE 0.9 % IV SOLN: 500.0000 mL | Freq: Once | INTRAVENOUS | Status: DC

## 2022-11-24 MED ORDER — SUCRALFATE 1 GM/10ML PO SUSP
1.0000 g | Freq: Two times a day (BID) | ORAL | 12 refills | Status: DC
Start: 2022-11-24 — End: 2023-03-17
  Filled 2022-11-24: qty 600, 30d supply, fill #0
  Filled 2022-11-24: qty 60, 3d supply, fill #0

## 2022-11-24 NOTE — Patient Instructions (Addendum)
Resume Eliquis on Friday 11/26/22 as normal  Stop Nexium and start Dexilant 30 mg twice daily    YOU HAD AN ENDOSCOPIC PROCEDURE TODAY AT THE [facility]:   Refer to the procedure report that was given to you for any specific questions about what was found during the examination.  If the procedure report does not answer your questions, please call your gastroenterologist to clarify.  If you requested that your care partner not be given the details of your procedure findings, then the procedure report has been included in a sealed envelope for you to review at your convenience later.  YOU SHOULD EXPECT: Some feelings of bloating in the abdomen. Passage of more gas than usual.  Walking can help get rid of the air that was put into your GI tract during the procedure and reduce the bloating. If you had a lower endoscopy (such as a colonoscopy or flexible sigmoidoscopy) you may notice spotting of blood in your stool or on the toilet paper. If you underwent a bowel prep for your procedure, you may not have a normal bowel movement for a few days.  Please Note:  You might notice some irritation and congestion in your nose or some drainage.  This is from the oxygen used during your procedure.  There is no need for concern and it should clear up in a day or so.  SYMPTOMS TO REPORT IMMEDIATELY:  Following upper endoscopy (EGD)  Vomiting of blood or coffee ground material  New chest pain or pain under the shoulder blades  Painful or persistently difficult swallowing  New shortness of breath  Fever of 100F or higher  Black, tarry-looking stools  For urgent or emergent issues, a gastroenterologist can be reached at any hour by calling [phone]. Do not use MyChart messaging for urgent concerns.    DIET:  We do recommend a small meal at first, but then you may proceed to your regular diet.  Drink plenty of fluids but you should avoid alcoholic beverages for 24 hours.  ACTIVITY:  You  should plan to take it easy for the rest of today and you should NOT DRIVE or use heavy machinery until tomorrow (because of the sedation medicines used during the test).    FOLLOW UP: Our staff will call the number listed on your records the next business day following your procedure.  We will call around 7:15- 8:00 am to check on you and address any questions or concerns that you may have regarding the information given to you following your procedure. If we do not reach you, we will leave a message.     If any biopsies were taken you will be contacted by phone or by letter within the next 1-3 weeks.  Please call us at [phone] if you have not heard about the biopsies in 3 weeks.    SIGNATURES/CONFIDENTIALITY: You and/or your care partner have signed paperwork which will be entered into your electronic medical record.  These signatures attest to the fact that that the information above on your After Visit Summary has been reviewed and is understood.  Full responsibility of the confidentiality of this discharge information lies with you and/or your care-partner.

## 2022-11-24 NOTE — Op Note (Signed)
[facility] Patient Name: Terry Parks Procedure Date: 11/24/2022 10:22 AM MRN: [id number] Endoscopist: Corliss Parish , MD, [id number] Age: 67 Referring MD:  Date of Birth: [birth year] Gender: Male Account #: [id number] Procedure:                Upper GI endoscopy Indications:              Heartburn, Esophageal reflux, Follow-up of                            esophagitis, Chest pain (non cardiac) Medicines:                Monitored Anesthesia Care Procedure:                Pre-Anesthesia Assessment:                           - Prior to the procedure, a History and Physical                            was performed, and patient medications and                            allergies were reviewed. The patient's tolerance of                            previous anesthesia was also reviewed. The risks                            and benefits of the procedure and the sedation                            options and risks were discussed with the patient.                            All questions were answered, and informed consent                            was obtained. Prior Anticoagulants: The patient has                            taken Eliquis (apixaban), last dose was 2 days                            prior to procedure. ASA Grade Assessment: III - A                            patient with severe systemic disease. After                            reviewing the risks and benefits, the patient was                            deemed in satisfactory condition to undergo the  procedure.                           After obtaining informed consent, the endoscope was                            passed under direct vision. Throughout the                            procedure, the patient's blood pressure, pulse, and                            oxygen saturations were monitored continuously. The                            GIF [id number] #[id number] was introduced through  the                            mouth, and advanced to the second part of duodenum.                            The upper GI endoscopy was accomplished without                            difficulty. The patient tolerated the procedure. Scope In: Scope Out: Findings:                 No gross lesions were noted in the proximal                            esophagus and in the mid esophagus.                           LA Grade C (one or more mucosal breaks continuous                            between tops of 2 or more mucosal folds, less than                            75% circumference) esophagitis with no bleeding was                            found 35 to 40 cm from the incisors.                           Two tongues of salmon-colored mucosa were present                            from 39 to 40 cm and scattered islands of                            salmon-colored mucosa were present from 35 to 40  cm. No other visible abnormalities were present.                            Biopsies were taken with a cold forceps for                            histology.                           The Z-line was irregular and was found 40 cm from                            the incisors.                           A 2 cm hiatal hernia was present.                           No gross lesions were noted in the entire examined                            stomach.                           No gross lesions were noted in the duodenal bulb,                            in the first portion of the duodenum and in the                            second portion of the duodenum. Complications:            No immediate complications. Estimated Blood Loss:     Estimated blood loss was minimal. Impression:               - No gross lesions in the proximal esophagus and in                            the mid esophagus.                           - LA Grade C erosive esophagitis with no bleeding                             found distally (5 cm).                           - Salmon-colored mucosa suspicious for Barrett's                            esophagus (though previously negative on biopsies).                            Biopsied.                           -  Z-line irregular, 40 cm from the incisors.                           - 2 cm hiatal hernia.                           - No gross lesions in the entire stomach.                           - No gross lesions in the duodenal bulb, in the                            first portion of the duodenum and in the second                            portion of the duodenum. Recommendation:           - The patient will be observed post-procedure,                            until all discharge criteria are met.                           - Discharge patient to home.                           - Patient has a contact number available for                            emergencies. The signs and symptoms of potential                            delayed complications were discussed with the                            patient. Return to normal activities tomorrow.                            Written discharge instructions were provided to the                            patient.                           - Resume previous diet.                           - Switch Nexium to Dexilant 30 mg twice daily.                           - Await pathology results.                           - Barium Swallow in the next few weeks will be  arranged.                           - Repeat upper endoscopy in 4 months to check                            healing.                           - If esophagitis persists then we will need to                            consider Voquenza.                           - Consideration of hiatal hernia repair +/- Nissen                            fundoplication, should also be considered in this                            patient  continues to have erosive esophagitis.                           - The findings and recommendations were discussed                            with the patient.                           - The findings and recommendations were discussed                            with the designated responsible adult. Corliss Parish, MD 11/24/2022 10:41:59 AM

## 2022-11-24 NOTE — Progress Notes (Signed)
A/ox3, pleased with MAC, report to RN 

## 2022-11-24 NOTE — Progress Notes (Signed)
Called to room to assist during endoscopic procedure.  Patient ID and intended procedure confirmed with present staff. Received instructions for my participation in the procedure from the performing physician.  

## 2022-11-24 NOTE — Progress Notes (Signed)
Pt's states no medical or surgical changes since previsit or office visit. 

## 2022-11-24 NOTE — Progress Notes (Signed)
GASTROENTEROLOGY PROCEDURE H&P NOTE   Primary Care Physician: Olegario Messier, MD  HPI: Terry Parks is a 67 y.o. male who presents for EGD for follow up esophagitis and rule out Barrett's.  Past Medical History:  Diagnosis Date   Abscessed tooth    top back large cavity no pain or drainage, one on bottom  pt pulled tooth 4-5 months ago, right top large hole in tooth   AKI (acute kidney injury) (HCC)    Allergic rhinitis    Anemia    Anxiety    Asthma    Atrial fibrillation (HCC)    BPH (benign prostatic hypertrophy)    Massive BPH noted on cystoscopy 1/23/ 2012 by Dr. Isabel Caprice.   Cancer [facility])    prostate cancer 2019   Cardiomyopathy [facility])    CHF (congestive heart failure) (HCC)    Cough 03/30/2012   Depression    Diabetes mellitus 04/08/2008   type 2   Dyspnea    Dysrhythmia 2019   Foley catheter in place 07-05-17 placed   Fracture, orbital (HCC) 2021   Right   GERD (gastroesophageal reflux disease) 2020   Headache(784.0)    hx migraines none recent   History of esophagitis 12/16/2019   Hyperlipemia    Hypertension    Hypertensive cardiopathy 03/01/2006   2-D echocardiogram 02/01/2012 showed moderate LVH, mildly to moderately reduced left ventricular systolic function with an estimated ejection fraction of 40-45%, and diffuse hypokinesis.  A nuclear medicine stress study done 01/31/2012 showed no reversible ischemia, a small mid anterior wall fixed defect/infarct, and ejection fraction 42%.       Neck pain    Nephrolithiasis 05/29/2010   CT scan of abdomen/pelvis on 05/29/2010 showed an obstructing approximate 1-2 mm calculus at the left UVJ, and an approximate 1-2 mm left lower pole renal calculus.   Patient had continuing severe pain , and an elevation of his serum creatinine to a value of 1.75 on 06/06/2010.  Patient underwent cystoscopy on 06/08/2010 by Dr. Isabel Caprice, but attempts at retrograde pyelogram and ureteroscopy were unsucc   Numbness 01/08/2018   Obstructive  sleep apnea 03/06/2008   Sleep study 03/06/08 showed severe OSA/hypopnea syndrome, with successful CPAP titration to 13 CWP using a medium ResMed Mirage Quattro full face mask with heated humidifier.    Rash 04/17/2014   Renal calculus 05/29/2010   CT scan of abdomen/pelvis on 05/29/2010 showed an obstructing approximate 1-2 mm calculus at the left UVJ, and an approximate 1-2 mm left lower pole renal calculus.   Patient had continuing severe pain , and an elevation of his serum creatinine to a value of 1.75 on 06/06/2010.  The stone had apparently passed and was not seen on repeat CT 06/08/2010.   Sleep apnea    haven't use cpap in 2 years   Tooth pain 10/29/2017   Urinary straining 11/02/2016   Past Surgical History:  Procedure Laterality Date   big toe nails removed Bilateral 20 yrs ago   BIOPSY  06/23/2022   Procedure: BIOPSY;  Surgeon: Shellia Cleverly, DO;  Location: [facility];  Service: Gastroenterology;;   CARDIOVERSION N/A 06/08/2017   Procedure: CARDIOVERSION;  Surgeon: Wendall Stade, MD;  Location: [facility];  Service: Cardiovascular;  Laterality: N/A;   COLONOSCOPY     CYSTOSCOPY W/ RETROGRADES     ESOPHAGOGASTRODUODENOSCOPY (EGD) WITH PROPOFOL N/A 06/23/2022   Procedure: ESOPHAGOGASTRODUODENOSCOPY (EGD) WITH PROPOFOL;  Surgeon: Shellia Cleverly, DO;  Location: [facility];  Service: Gastroenterology;  Laterality: N/A;  FRACTURE SURGERY Right 2021   5th right phalange   HEMOSTASIS CLIP PLACEMENT  06/23/2022   Procedure: HEMOSTASIS CLIP PLACEMENT;  Surgeon: Shellia Cleverly, DO;  Location: [facility];  Service: Gastroenterology;;   INCISIONAL HERNIA REPAIR N/A 03/24/2020   Procedure: LAPAROSCOPIC INCISIONAL HERNIA REPAR WITH MESH;  Surgeon: Violeta Gelinas, MD;  Location: [facility] OR;  Service: General;  Laterality: N/A;   IR RADIOLOGIST EVAL & MGMT  02/02/2018   LYMPHADENECTOMY Bilateral 10/06/2017   Procedure: LYMPHADENECTOMY;  Surgeon: Crista Elliot, MD;  Location: [facility];   Service: Urology;  Laterality: Bilateral;   ROBOT ASSISTED LAPAROSCOPIC RADICAL PROSTATECTOMY N/A 10/06/2017   Procedure: XI ROBOTIC ASSISTED LAPAROSCOPIC RADICAL PROSTATECTOMY;  Surgeon: Crista Elliot, MD;  Location: [facility];  Service: Urology;  Laterality: N/A;   TRANSURETHRAL RESECTION OF BLADDER TUMOR N/A 07/13/2017   Procedure: TRANSURETHRAL RESECTION OF PROSTATE;  Surgeon: Crista Elliot, MD;  Location: [facility];  Service: Urology;  Laterality: N/A;   Current Outpatient Medications  Medication Sig Dispense Refill   Accu-Chek Softclix Lancets lancets Check blood sugar three times a day as instructed 300 each 11   amiodarone (PACERONE) 200 MG tablet TAKE ONE TABLET BY MOUTH EVERY MORNING 30 tablet 0   amLODipine (NORVASC) 5 MG tablet TAKE ONE TABLET BY MOUTH EVERY EVENING 30 tablet 6   apixaban (ELIQUIS) 5 MG TABS tablet Take 1 tablet (5 mg total) by mouth 2 (two) times daily. 60 tablet 11   atorvastatin (LIPITOR) 40 MG tablet TAKE ONE TABLET BY MOUTH EVERY EVENING 30 tablet 6   Blood Glucose Monitoring Suppl (ACCU-CHEK GUIDE) w/Device KIT 1 Units by Does not apply route 3 (three) times daily. 1 kit 0   Blood Glucose Monitoring Suppl (BLOOD GLUCOSE MONITOR SYSTEM) w/Device KIT Use up to 4 (four) times daily as directed 1 kit 0   carvedilol (COREG) 25 MG tablet TAKE ONE TABLET BY MOUTH EVERY MORNING and TAKE ONE TABLET BY MOUTH EVERY EVENING 180 tablet 6   ciprofloxacin (CIPRO) 500 MG tablet Take 1 tablet (500 mg total) by mouth 2 (two) times daily for 6 doses. First dose morning of urodynamics procedure (Patient not taking: Reported on 11/10/2022) 6 tablet 0   COMFORT EZ PEN NEEDLES 31G X 6 MM MISC USE AS DIRECTED AT BEDTIME 100 each 3   COMFORT EZ PEN NEEDLES 32G X 6 MM MISC      DULoxetine (CYMBALTA) 60 MG capsule TAKE ONE CAPSULE BY MOUTH ONCE DAILY 90 capsule 3   esomeprazole (NEXIUM) 40 MG capsule Take 1 capsule (40 mg total) by mouth 2 (two) times daily before a meal. 60 capsule 0    ezetimibe (ZETIA) 10 MG tablet TAKE ONE TABLET BY MOUTH EVERY EVENING 90 tablet 2   glucose blood (ACCU-CHEK GUIDE) test strip USE TO check blood glucose THREE TIMES DAILY AS DIRECTED 300 strip 11   insulin glargine (LANTUS SOLOSTAR) 100 UNIT/ML Solostar Pen Inject 30 Units into the skin 2 (two) times daily. (Patient not taking: Reported on 11/10/2022) 30 mL 6   Ipratropium-Albuterol (COMBIVENT RESPIMAT) 20-100 MCG/ACT AERS respimat Inhale 1 puff into the lungs every 6 (six) hours. (Patient taking differently: Inhale 1 puff into the lungs every 6 (six) hours as needed for shortness of breath.) 4 g 3   JARDIANCE 25 MG TABS tablet TAKE ONE TABLET BY MOUTH EVERY MORNING 90 tablet 3   mirabegron ER (MYRBETRIQ) 50 MG TB24 tablet Take 1 tablet (50 mg total) by mouth  1 (one) hour before bedtime. 90 tablet 3   oxybutynin (DITROPAN XL) 15 MG 24 hr tablet Take 1 tablet (15 mg total) by mouth daily. 90 tablet 3   sacubitril-valsartan (ENTRESTO) 97-103 MG TAKE ONE TABLET BY MOUTH EVERY MORNING and TAKE ONE TABLET BY MOUTH EVERY EVENING 180 tablet 6   SITagliptin 50 MG TABS Take 50 mg by mouth daily. 30 tablet 11   spironolactone (ALDACTONE) 50 MG tablet TAKE ONE TABLET BY MOUTH EVERY MORNING 30 tablet 6   sucralfate (CARAFATE) 1 GM/10ML suspension Take 10 mLs (1 g total) by mouth 4 (four) times daily for next 1 month then go to twice daily 420 mL 6   torsemide (DEMADEX) 20 MG tablet TAKE TWO TABLETS BY MOUTH EVERY MORNING (Patient taking differently: Take 40 mg by mouth daily.) 180 tablet 2   Current Facility-Administered Medications  Medication Dose Route Frequency Provider Last Rate Last Admin   0.9 %  sodium chloride infusion  500 mL Intravenous Once Mansouraty, [name], MD        Current Outpatient Medications:    Accu-Chek Softclix Lancets lancets, Check blood sugar three times a day as instructed, Disp: 300 each, Rfl: 11   amiodarone (PACERONE) 200 MG tablet, TAKE ONE TABLET BY MOUTH EVERY MORNING,  Disp: 30 tablet, Rfl: 0   amLODipine (NORVASC) 5 MG tablet, TAKE ONE TABLET BY MOUTH EVERY EVENING, Disp: 30 tablet, Rfl: 6   apixaban (ELIQUIS) 5 MG TABS tablet, Take 1 tablet (5 mg total) by mouth 2 (two) times daily., Disp: 60 tablet, Rfl: 11   atorvastatin (LIPITOR) 40 MG tablet, TAKE ONE TABLET BY MOUTH EVERY EVENING, Disp: 30 tablet, Rfl: 6   Blood Glucose Monitoring Suppl (ACCU-CHEK GUIDE) w/Device KIT, 1 Units by Does not apply route 3 (three) times daily., Disp: 1 kit, Rfl: 0   Blood Glucose Monitoring Suppl (BLOOD GLUCOSE MONITOR SYSTEM) w/Device KIT, Use up to 4 (four) times daily as directed, Disp: 1 kit, Rfl: 0   carvedilol (COREG) 25 MG tablet, TAKE ONE TABLET BY MOUTH EVERY MORNING and TAKE ONE TABLET BY MOUTH EVERY EVENING, Disp: 180 tablet, Rfl: 6   ciprofloxacin (CIPRO) 500 MG tablet, Take 1 tablet (500 mg total) by mouth 2 (two) times daily for 6 doses. First dose morning of urodynamics procedure (Patient not taking: Reported on 11/10/2022), Disp: 6 tablet, Rfl: 0   COMFORT EZ PEN NEEDLES 31G X 6 MM MISC, USE AS DIRECTED AT BEDTIME, Disp: 100 each, Rfl: 3   COMFORT EZ PEN NEEDLES 32G X 6 MM MISC, , Disp: , Rfl:    DULoxetine (CYMBALTA) 60 MG capsule, TAKE ONE CAPSULE BY MOUTH ONCE DAILY, Disp: 90 capsule, Rfl: 3   esomeprazole (NEXIUM) 40 MG capsule, Take 1 capsule (40 mg total) by mouth 2 (two) times daily before a meal., Disp: 60 capsule, Rfl: 0   ezetimibe (ZETIA) 10 MG tablet, TAKE ONE TABLET BY MOUTH EVERY EVENING, Disp: 90 tablet, Rfl: 2   glucose blood (ACCU-CHEK GUIDE) test strip, USE TO check blood glucose THREE TIMES DAILY AS DIRECTED, Disp: 300 strip, Rfl: 11   insulin glargine (LANTUS SOLOSTAR) 100 UNIT/ML Solostar Pen, Inject 30 Units into the skin 2 (two) times daily. (Patient not taking: Reported on 11/10/2022), Disp: 30 mL, Rfl: 6   Ipratropium-Albuterol (COMBIVENT RESPIMAT) 20-100 MCG/ACT AERS respimat, Inhale 1 puff into the lungs every 6 (six) hours. (Patient taking  differently: Inhale 1 puff into the lungs every 6 (six) hours as needed for shortness  of breath.), Disp: 4 g, Rfl: 3   JARDIANCE 25 MG TABS tablet, TAKE ONE TABLET BY MOUTH EVERY MORNING, Disp: 90 tablet, Rfl: 3   mirabegron ER (MYRBETRIQ) 50 MG TB24 tablet, Take 1 tablet (50 mg total) by mouth 1 (one) hour before bedtime., Disp: 90 tablet, Rfl: 3   oxybutynin (DITROPAN XL) 15 MG 24 hr tablet, Take 1 tablet (15 mg total) by mouth daily., Disp: 90 tablet, Rfl: 3   sacubitril-valsartan (ENTRESTO) 97-103 MG, TAKE ONE TABLET BY MOUTH EVERY MORNING and TAKE ONE TABLET BY MOUTH EVERY EVENING, Disp: 180 tablet, Rfl: 6   SITagliptin 50 MG TABS, Take 50 mg by mouth daily., Disp: 30 tablet, Rfl: 11   spironolactone (ALDACTONE) 50 MG tablet, TAKE ONE TABLET BY MOUTH EVERY MORNING, Disp: 30 tablet, Rfl: 6   sucralfate (CARAFATE) 1 GM/10ML suspension, Take 10 mLs (1 g total) by mouth 4 (four) times daily for next 1 month then go to twice daily, Disp: 420 mL, Rfl: 6   torsemide (DEMADEX) 20 MG tablet, TAKE TWO TABLETS BY MOUTH EVERY MORNING (Patient taking differently: Take 40 mg by mouth daily.), Disp: 180 tablet, Rfl: 2  Current Facility-Administered Medications:    0.9 %  sodium chloride infusion, 500 mL, Intravenous, Once, Mansouraty, [name], MD Allergies  Allergen Reactions   Lisinopril Cough   Family History  Problem Relation Age of Onset   Diabetes Mother    Ulcerative colitis Mother    Breast cancer Mother    Hypertension Father    Stroke Father    Diabetes Maternal Grandmother    Colon cancer Neg Hx    Prostate cancer Neg Hx    Heart attack Neg Hx    Esophageal cancer Neg Hx    Inflammatory bowel disease Neg Hx    Liver disease Neg Hx    Pancreatic cancer Neg Hx    Rectal cancer Neg Hx    Stomach cancer Neg Hx    Colon polyps Neg Hx    Social History   Socioeconomic History   Marital status: Single    Spouse name: Not on file   Number of children: 4   Years of education: 16    Highest education level: Not on file  Occupational History   Occupation:        Employer: UNEMPLOYED  Tobacco Use   Smoking status: Never   Smokeless tobacco: Never  Vaping Use   Vaping Use: Never used  Substance and Sexual Activity   Alcohol use: No    Alcohol/week: 0.0 standard drinks of alcohol   Drug use: No   Sexual activity: Not Currently  Other Topics Concern   Not on file  Social History Narrative   Divorced, 4 children, lives alone.     Social Determinants of Health   Financial Resource Strain: Low Risk  (07/15/2022)   Overall Financial Resource Strain (CARDIA)    Difficulty of Paying Living Expenses: Not very hard  Food Insecurity: No Food Insecurity (06/23/2022)   Hunger Vital Sign    Worried About Running Out of Food in the Last Year: Never true    Ran Out of Food in the Last Year: Never true  Transportation Needs: No Transportation Needs (06/23/2022)   PRAPARE - Administrator, Civil Service (Medical): No    Lack of Transportation (Non-Medical): No  Physical Activity: Sufficiently Active (08/12/2022)   Exercise Vital Sign    Days of Exercise per Week: 7 days    Minutes  of Exercise per Session: 30 min  Stress: No Stress Concern Present (07/15/2022)   [employer] of Occupational Health - Occupational Stress Questionnaire    Feeling of Stress : Only a little  Social Connections: Moderately Integrated (08/12/2022)   Social Connection and Isolation Panel [NHANES]    Frequency of Communication with Friends and Family: More than three times a week    Frequency of Social Gatherings with Friends and Family: More than three times a week    Attends Religious Services: More than 4 times per year    Active Member of [employer] or Organizations: Yes    Attends Banker Meetings: More than 4 times per year    Marital Status: Divorced  Intimate Partner Violence: Not At Risk (06/23/2022)   Humiliation, Afraid, Rape, and Kick questionnaire    Fear of  Current or Ex-Partner: No    Emotionally Abused: No    Physically Abused: No    Sexually Abused: No    Physical Exam: There were no vitals filed for this visit. There is no height or weight on file to calculate BMI. GEN: NAD EYE: Sclerae anicteric ENT: MMM CV: Non-tachycardic GI: Soft, NT/ND NEURO:  Alert & Oriented x 3  Lab Results: No results for input(s): "WBC", "HGB", "HCT", "PLT" in the last 72 hours. BMET No results for input(s): "NA", "K", "CL", "CO2", "GLUCOSE", "BUN", "CREATININE", "CALCIUM" in the last 72 hours. LFT No results for input(s): "PROT", "ALBUMIN", "AST", "ALT", "ALKPHOS", "BILITOT", "BILIDIR", "IBILI" in the last 72 hours. PT/INR No results for input(s): "LABPROT", "INR" in the last 72 hours.   Impression / Plan: This is a 67 y.o.male who presents for EGD for follow up esophagitis and rule out Barrett's.   The risks and benefits of endoscopic evaluation/treatment were discussed with the patient and/or family; these include but are not limited to the risk of perforation, infection, bleeding, missed lesions, lack of diagnosis, severe illness requiring hospitalization, as well as anesthesia and sedation related illnesses.  The patient's history has been reviewed, patient examined, no change in status, and deemed stable for procedure.  The patient and/or family is agreeable to proceed.    Corliss Parish, MD [facility] Office # [phone]

## 2022-11-25 ENCOUNTER — Telehealth: Payer: Self-pay

## 2022-11-25 NOTE — Telephone Encounter (Signed)
  Follow up Call-     11/24/2022    9:33 AM 08/20/2022    2:47 PM 06/09/2021    9:38 AM  Call back number  Post procedure Call Back phone  # [phone]  Permission to leave phone message Yes Yes Yes     Patient questions:  Do you have a fever, pain , or abdominal swelling? No. Pain Score  0 *  Have you tolerated food without any problems? Yes.    Have you been able to return to your normal activities? Yes.    Do you have any questions about your discharge instructions: Diet   No. Medications  No. Follow up visit  No.  Do you have questions or concerns about your Care? No.  Patient states he "threw up once last nighty, but is feeing better now".  Denies N/V at present time.  He was instructed to call emergency number on discharge instructions if N/V returned and he verbalized understanding.  Actions: * If pain score is 4 or above: No action needed, pain <4.

## 2022-11-29 ENCOUNTER — Telehealth: Payer: Self-pay

## 2022-11-29 DIAGNOSIS — K2100 Gastro-esophageal reflux disease with esophagitis, without bleeding: Secondary | ICD-10-CM

## 2022-11-29 DIAGNOSIS — K2210 Ulcer of esophagus without bleeding: Secondary | ICD-10-CM

## 2022-11-29 NOTE — Telephone Encounter (Signed)
-----   Message from [facility] sent at 11/24/2022 10:42 AM EDT ----- Regarding: Followup Shirlie Enck or Covering RN, This patient needs Barium swallow for follow of GERD and esophagitis. Please place order as able. Followup in clinic with APP or myself in 6-10 weeks. Thanks. GM

## 2022-11-30 ENCOUNTER — Telehealth: Payer: Self-pay

## 2022-11-30 ENCOUNTER — Other Ambulatory Visit (HOSPITAL_COMMUNITY): Payer: Self-pay

## 2022-11-30 NOTE — Telephone Encounter (Signed)
Barium swallow ordered and sent to the schedulers.  Pt aware and appt already made to see Shanda Bumps in Sept.

## 2022-11-30 NOTE — Telephone Encounter (Signed)
*  Gastro  PA request received for Dexlansoprazole 30MG  dr capsules  PA submitted to [facility] Medicare via CMM and is pending additional questions/determination  Key: [id number]

## 2022-12-01 ENCOUNTER — Encounter: Payer: Self-pay | Admitting: Gastroenterology

## 2022-12-02 ENCOUNTER — Other Ambulatory Visit (HOSPITAL_COMMUNITY): Payer: Self-pay

## 2022-12-07 ENCOUNTER — Other Ambulatory Visit (HOSPITAL_COMMUNITY): Payer: Self-pay

## 2022-12-07 NOTE — Telephone Encounter (Signed)
Noted  

## 2022-12-07 NOTE — Telephone Encounter (Signed)
Pharmacy Patient Advocate Encounter  Received notification from [facility] Medicaid that Prior Authorization for Dexlansoprazole 30MG  dr capsules has been APPROVED from 11-16-2022 to until further notice..  PA #/Case ID/Reference #: [id number]  Co-pay is $1.55 per 30 day supply. Quantity approved is 60 capsules per 30 days.  Must be processed as BRAND.

## 2022-12-09 ENCOUNTER — Other Ambulatory Visit: Payer: Self-pay | Admitting: Cardiovascular Disease

## 2022-12-09 DIAGNOSIS — I4819 Other persistent atrial fibrillation: Secondary | ICD-10-CM

## 2022-12-14 ENCOUNTER — Telehealth: Payer: Self-pay

## 2022-12-14 MED ORDER — BASAGLAR KWIKPEN 100 UNIT/ML ~~LOC~~ SOPN: 30.0000 [IU] | PEN_INJECTOR | Freq: Two times a day (BID) | SUBCUTANEOUS | 2 refills | Status: DC

## 2022-12-14 NOTE — Telephone Encounter (Signed)
Prescription for basaglar sent. Thanks!

## 2022-12-14 NOTE — Telephone Encounter (Signed)
Amy  from [facility] is requesting a call back ... She left a message on doris vmail ...  Pt has a new insurance and it does not cover the lantus  any more but she did state it will cover the basaglar  insulin ...     Name : Amy   Number : [phone]

## 2022-12-22 ENCOUNTER — Inpatient Hospital Stay (HOSPITAL_COMMUNITY): Admission: RE | Admit: 2022-12-22 | Payer: Medicare Other | Source: Ambulatory Visit

## 2022-12-30 ENCOUNTER — Other Ambulatory Visit: Payer: Medicare Other | Admitting: Obstetrics and Gynecology

## 2022-12-30 NOTE — Patient Instructions (Signed)
Hi Mr. Ziemba, I returned your phone call and left a message-I am sorry I could not reach you - as a part of your Medicaid benefit, you are eligible for care management and care coordination services at no cost or copay. I was unable to reach you by phone today but would be happy to help you with your health related needs. Please feel free to call me at [phone]  Kathi Der RN, BSN [place]  [facility] Care Management Coordinator - Managed [place] High Risk [phone]

## 2022-12-30 NOTE — Patient Outreach (Signed)
RNCM returned patient's phone call-no answer, left message.  Kathi Der RN, BSN [place]  [facility] - Managed Medicaid High Risk [phone].

## 2023-01-20 ENCOUNTER — Other Ambulatory Visit: Payer: Self-pay

## 2023-01-20 DIAGNOSIS — I4819 Other persistent atrial fibrillation: Secondary | ICD-10-CM

## 2023-01-20 MED ORDER — AMIODARONE HCL 200 MG PO TABS
200.0000 mg | ORAL_TABLET | Freq: Every morning | ORAL | 0 refills | Status: DC
Start: 2023-01-20 — End: 2023-07-07

## 2023-01-21 ENCOUNTER — Other Ambulatory Visit: Payer: Self-pay | Admitting: Cardiovascular Disease

## 2023-01-21 DIAGNOSIS — I4819 Other persistent atrial fibrillation: Secondary | ICD-10-CM

## 2023-01-28 ENCOUNTER — Other Ambulatory Visit: Payer: Self-pay | Admitting: Internal Medicine

## 2023-01-28 ENCOUNTER — Other Ambulatory Visit (HOSPITAL_COMMUNITY): Payer: Self-pay

## 2023-02-01 ENCOUNTER — Other Ambulatory Visit (HOSPITAL_COMMUNITY): Payer: Self-pay

## 2023-02-01 MED ORDER — LANTUS SOLOSTAR 100 UNIT/ML ~~LOC~~ SOPN
30.0000 [IU] | PEN_INJECTOR | Freq: Two times a day (BID) | SUBCUTANEOUS | 11 refills | Status: DC
  Filled 2023-02-01 – 2023-04-22 (×5): qty 15, 25d supply, fill #0
  Filled 2023-06-20: qty 15, 25d supply, fill #1

## 2023-02-01 NOTE — Telephone Encounter (Signed)
Changed insulin due to insurance restrictions

## 2023-02-03 ENCOUNTER — Ambulatory Visit: Payer: 59 | Admitting: Gastroenterology

## 2023-02-07 ENCOUNTER — Other Ambulatory Visit (HOSPITAL_COMMUNITY): Payer: Self-pay

## 2023-02-17 ENCOUNTER — Other Ambulatory Visit (HOSPITAL_COMMUNITY): Payer: Self-pay

## 2023-02-23 ENCOUNTER — Ambulatory Visit: Payer: 59 | Admitting: Student

## 2023-02-23 ENCOUNTER — Ambulatory Visit: Payer: 59

## 2023-02-23 ENCOUNTER — Encounter: Payer: Self-pay | Admitting: Student

## 2023-02-23 VITALS — BP 119/74 | HR 79 | Temp 97.5°F | Ht 73.0 in | Wt 280.5 lb

## 2023-02-23 DIAGNOSIS — Z7901 Long term (current) use of anticoagulants: Secondary | ICD-10-CM

## 2023-02-23 DIAGNOSIS — I5042 Chronic combined systolic (congestive) and diastolic (congestive) heart failure: Secondary | ICD-10-CM

## 2023-02-23 DIAGNOSIS — E1159 Type 2 diabetes mellitus with other circulatory complications: Secondary | ICD-10-CM

## 2023-02-23 DIAGNOSIS — E1142 Type 2 diabetes mellitus with diabetic polyneuropathy: Secondary | ICD-10-CM | POA: Diagnosis not present

## 2023-02-23 DIAGNOSIS — Z7985 Long-term (current) use of injectable non-insulin antidiabetic drugs: Secondary | ICD-10-CM

## 2023-02-23 DIAGNOSIS — Z794 Long term (current) use of insulin: Secondary | ICD-10-CM

## 2023-02-23 DIAGNOSIS — N183 Chronic kidney disease, stage 3 unspecified: Secondary | ICD-10-CM | POA: Diagnosis not present

## 2023-02-23 DIAGNOSIS — I4811 Longstanding persistent atrial fibrillation: Secondary | ICD-10-CM | POA: Diagnosis not present

## 2023-02-23 DIAGNOSIS — I152 Hypertension secondary to endocrine disorders: Secondary | ICD-10-CM

## 2023-02-23 DIAGNOSIS — E1122 Type 2 diabetes mellitus with diabetic chronic kidney disease: Secondary | ICD-10-CM | POA: Diagnosis not present

## 2023-02-23 LAB — POCT GLYCOSYLATED HEMOGLOBIN (HGB A1C): HbA1c POC (<> result, manual entry): >14 % — AB (ref 4.0–5.6)

## 2023-02-23 LAB — GLUCOSE, CAPILLARY: Glucose-Capillary: 409 mg/dL — ABNORMAL HIGH (ref 70–99)

## 2023-02-23 NOTE — Patient Instructions (Addendum)
Thank you, Mr.Dong Hodapp for allowing [place] to provide your care today.   Today we discussed : Diabetes  These are your  medicines for diabetes  -take 30 units of Lantus twice a day.  Please check your glucose first thing in the morning. - take sitagliptin 50 mg and  -Jardiance 25 mg     2.  Reduce torsemide to once a day.  I have ordered the following labs for you:  Lab Orders         Glucose, capillary         POC Hbg A1C          Referrals ordered today:   Referral Orders  No referral(s) requested today     Follow up:  1 week      Should you have any questions or concerns please call the internal medicine clinic at [phone].    Laretta Bolster, MD  [facility]

## 2023-02-23 NOTE — Progress Notes (Unsigned)
CC: Here for follow-up on diabetes.  HPI:  Terry Parks is a 67 y.o. male living with a history stated below and presents today for follow-up on diabetes.. Please see problem based assessment and plan for additional details.  Past Medical History:  Diagnosis Date   Abscessed tooth    top back large cavity no pain or drainage, one on bottom  pt pulled tooth 4-5 months ago, right top large hole in tooth   AKI (acute kidney injury) (HCC)    Allergic rhinitis    Anemia    Anxiety    Asthma    Atrial fibrillation (HCC)    BPH (benign prostatic hypertrophy)    Massive BPH noted on cystoscopy 1/23/ 2012 by Dr. Isabel Caprice.   Cancer [facility])    prostate cancer 2019   Cardiomyopathy [facility])    CHF (congestive heart failure) (HCC)    Cough 03/30/2012   Depression    Diabetes mellitus 04/08/2008   type 2   Dyspnea    Dysrhythmia 2019   Foley catheter in place 07-05-17 placed   Fracture, orbital (HCC) 2021   Right   GERD (gastroesophageal reflux disease) 2020   Headache(784.0)    hx migraines none recent   History of esophagitis 12/16/2019   Hyperlipemia    Hypertension    Hypertensive cardiopathy 03/01/2006   2-D echocardiogram 02/01/2012 showed moderate LVH, mildly to moderately reduced left ventricular systolic function with an estimated ejection fraction of 40-45%, and diffuse hypokinesis.  A nuclear medicine stress study done 01/31/2012 showed no reversible ischemia, a small mid anterior wall fixed defect/infarct, and ejection fraction 42%.       Neck pain    Nephrolithiasis 05/29/2010   CT scan of abdomen/pelvis on 05/29/2010 showed an obstructing approximate 1-2 mm calculus at the left UVJ, and an approximate 1-2 mm left lower pole renal calculus.   Patient had continuing severe pain , and an elevation of his serum creatinine to a value of 1.75 on 06/06/2010.  Patient underwent cystoscopy on 06/08/2010 by Dr. Isabel Caprice, but attempts at retrograde pyelogram and ureteroscopy were unsucc    Numbness 01/08/2018   Obstructive sleep apnea 03/06/2008   Sleep study 03/06/08 showed severe OSA/hypopnea syndrome, with successful CPAP titration to 13 CWP using a medium ResMed Mirage Quattro full face mask with heated humidifier.    Rash 04/17/2014   Renal calculus 05/29/2010   CT scan of abdomen/pelvis on 05/29/2010 showed an obstructing approximate 1-2 mm calculus at the left UVJ, and an approximate 1-2 mm left lower pole renal calculus.   Patient had continuing severe pain , and an elevation of his serum creatinine to a value of 1.75 on 06/06/2010.  The stone had apparently passed and was not seen on repeat CT 06/08/2010.   Sleep apnea    haven't use cpap in 2 years   Tooth pain 10/29/2017   Urinary straining 11/02/2016    Current Outpatient Medications on File Prior to Visit  Medication Sig Dispense Refill   Accu-Chek Softclix Lancets lancets Check blood sugar three times a day as instructed 300 each 11   amiodarone (PACERONE) 200 MG tablet Take 1 tablet (200 mg total) by mouth every morning. 15 tablet 0   amLODipine (NORVASC) 5 MG tablet TAKE ONE TABLET BY MOUTH EVERY EVENING 30 tablet 6   apixaban (ELIQUIS) 5 MG TABS tablet Take 1 tablet (5 mg total) by mouth 2 (two) times daily. 60 tablet 11   atorvastatin (LIPITOR) 40 MG tablet TAKE ONE  TABLET BY MOUTH EVERY EVENING 30 tablet 6   Blood Glucose Monitoring Suppl (ACCU-CHEK GUIDE) w/Device KIT 1 Units by Does not apply route 3 (three) times daily. 1 kit 0   Blood Glucose Monitoring Suppl (BLOOD GLUCOSE MONITOR SYSTEM) w/Device KIT Use up to 4 (four) times daily as directed 1 kit 0   carvedilol (COREG) 25 MG tablet TAKE ONE TABLET BY MOUTH EVERY MORNING and TAKE ONE TABLET BY MOUTH EVERY EVENING 180 tablet 6   COMFORT EZ PEN NEEDLES 31G X 6 MM MISC USE AS DIRECTED AT BEDTIME 100 each 3   COMFORT EZ PEN NEEDLES 32G X 6 MM MISC      Dexlansoprazole (DEXILANT) 30 MG capsule DR Take 1 capsule (30 mg total) by mouth 2 (two) times daily. 60 capsule  12   DULoxetine (CYMBALTA) 60 MG capsule TAKE ONE CAPSULE BY MOUTH ONCE DAILY 90 capsule 3   esomeprazole (NEXIUM) 40 MG capsule Take 1 capsule (40 mg total) by mouth 2 (two) times daily before a meal. 60 capsule 0   ezetimibe (ZETIA) 10 MG tablet TAKE ONE TABLET BY MOUTH EVERY EVENING 90 tablet 2   glucose blood (ACCU-CHEK GUIDE) test strip USE TO check blood glucose THREE TIMES DAILY AS DIRECTED 300 strip 11   insulin glargine (LANTUS SOLOSTAR) 100 UNIT/ML Solostar Pen Inject 30 Units into the skin 2 (two) times daily. 15 mL 11   Ipratropium-Albuterol (COMBIVENT RESPIMAT) 20-100 MCG/ACT AERS respimat Inhale 1 puff into the lungs every 6 (six) hours. (Patient taking differently: Inhale 1 puff into the lungs every 6 (six) hours as needed for shortness of breath.) 4 g 3   JARDIANCE 25 MG TABS tablet TAKE ONE TABLET BY MOUTH EVERY MORNING 90 tablet 3   mirabegron ER (MYRBETRIQ) 50 MG TB24 tablet Take 1 tablet (50 mg total) by mouth 1 (one) hour before bedtime. 90 tablet 3   oxybutynin (DITROPAN XL) 15 MG 24 hr tablet Take 1 tablet (15 mg total) by mouth daily. 90 tablet 3   sacubitril-valsartan (ENTRESTO) 97-103 MG TAKE ONE TABLET BY MOUTH EVERY MORNING and TAKE ONE TABLET BY MOUTH EVERY EVENING 180 tablet 6   SITagliptin 50 MG TABS Take 50 mg by mouth daily. 30 tablet 11   spironolactone (ALDACTONE) 50 MG tablet TAKE ONE TABLET BY MOUTH EVERY MORNING 30 tablet 6   sucralfate (CARAFATE) 1 GM/10ML suspension Take 10 mLs (1 g total) by mouth 2 (two) times daily. 600 mL 12   torsemide (DEMADEX) 20 MG tablet TAKE TWO TABLETS BY MOUTH EVERY MORNING (Patient taking differently: Take 40 mg by mouth daily.) 180 tablet 2   No current facility-administered medications on file prior to visit.     Review of Systems: ROS negative except for what is noted on the assessment and plan.  Vitals:   02/23/23 0944  BP: 119/74  Pulse: 79  Temp: (!) 97.5 F (36.4 C)  TempSrc: Oral  SpO2: 100%  Weight: 280 lb 8  oz (127.2 kg)  Height: 6\' 1"  (1.854 m)    Physical Exam: Constitutional: Well appearing, not in acute distress. Cardiovascular: regular rate and rhythm, no m/r/g Pulmonary/Chest: normal work of breathing on room air, lungs clear to auscultation bilaterally Abdominal: soft, non-tender, non-distended  Assessment & Plan:   Patient seen with Dr. Mayford Knife CKD stage 3 due to type 2 diabetes mellitus [facility]) Patient with uncontrolled diabetes. A1c  today >14.  Fasting blood glucose > 250.  Patient has not been compliant with his medications.  He understand the importance of this meds however does not feel up to it.  At the last OV, there was a discussion of doing Lantus 30 units twice a day.  However patient is only doing it once a day.  Will also prescribe sitagliptin 50 mg, however the name does not sound familiar to the patient.  He denies any foot ulcers, pain or tingling sensation in extremities..    -Urine albumin/creatinine  -Continue Lantus 30 units to twice daily. -Continue slitagliptin 50 mg . -Follow-up appointment with Dr. Mayford Knife on 03/17/2023.  Longstanding persistent atrial fibrillation (HCC) A FIB  Rate 75, and regular.  -Continue Eliquis 5 mg.  Hypertension associated with diabetes (HCC) BP today 179/78.  Intermittently compliant with his medicines.  Denies any chest pain, palpitation.  -Continue amlodipine 5 mg. -BMP -Titrate amlodipine to 10 mg at next office visit.  Chronic combined systolic and diastolic CHF (congestive heart failure) (HCC)  Patient has not followed with cardiology in over a year and a half.  He is taking torsemide twice a day.  On exam, he looks mildly dehydrated in lower extremities. He is not having enough sleep due to  night diuresis.  This is exacerbated by the fact the patient is also on another diuretic, spironolactone.  Otherwise he denies orthopnea, PND or extremity edema.  I discussed with patient to schedule an appointment to help  consolidate his medicines.  -Reduce torsemide 20 mg once a day. -Carvedilol 25 mg twice daily -Entresto 97-103 mg.  -Spironolactone 50 mg.   Laretta Bolster, MD [facility] Internal Medicine, PGY-1 Phone: [phone] Date 02/24/2023 Time 10:17 AM

## 2023-02-24 LAB — MICROALBUMIN / CREATININE URINE RATIO
Creatinine, Urine: 78.4 mg/dL
Microalb/Creat Ratio: 70 mg/g{creat} — ABNORMAL HIGH (ref 0–29)
Microalbumin, Urine: 54.5 ug/mL

## 2023-02-24 NOTE — Assessment & Plan Note (Signed)
A FIB  Rate 75, and regular.  -Continue Eliquis 5 mg.

## 2023-02-24 NOTE — Assessment & Plan Note (Signed)
Patient with uncontrolled diabetes. A1c  today >14.  Fasting blood glucose > 250.  Patient has not been compliant with his medications.  He understand the importance of this meds however does not feel up to it.  At the last OV, there was a discussion of doing Lantus 30 units twice a day.  However patient is only doing it once a day.  Will also prescribe sitagliptin 50 mg, however the name does not sound familiar to the patient.  He denies any foot ulcers, pain or tingling sensation in extremities..    -Urine albumin/creatinine  -Continue Lantus 30 units to twice daily. -Continue slitagliptin 50 mg . -Follow-up appointment with Dr. Mayford Knife on 03/17/2023.

## 2023-02-24 NOTE — Assessment & Plan Note (Signed)
BP today 179/78.  Intermittently compliant with his medicines.  Denies any chest pain, palpitation.  -Continue amlodipine 5 mg. -BMP -Titrate amlodipine to 10 mg at next office visit.

## 2023-02-24 NOTE — Assessment & Plan Note (Signed)
Patient has not followed with cardiology in over a year and a half.  He is taking torsemide twice a day.  On exam, he looks mildly dehydrated in lower extremities. He is not having enough sleep due to  night diuresis.  This is exacerbated by the fact the patient is also on another diuretic, spironolactone.  Otherwise he denies orthopnea, PND or extremity edema.  I discussed with patient to schedule an appointment to help consolidate his medicines.  -Reduce torsemide 20 mg once a day. -Carvedilol 25 mg twice daily -Entresto 97-103 mg.  -Spironolactone 50 mg.

## 2023-02-25 LAB — BASIC METABOLIC PANEL
BUN/Creatinine Ratio: 11 (ref 10–24)
BUN: 22 mg/dL (ref 8–27)
CO2: 22 mmol/L (ref 20–29)
Calcium: 9.2 mg/dL (ref 8.6–10.2)
Chloride: 92 mmol/L — ABNORMAL LOW (ref 96–106)
Creatinine, Ser: 1.96 mg/dL — ABNORMAL HIGH (ref 0.76–1.27)
Glucose: 389 mg/dL — ABNORMAL HIGH (ref 70–99)
Potassium: 3.9 mmol/L (ref 3.5–5.2)
Sodium: 134 mmol/L (ref 134–144)
eGFR: 37 mL/min/{1.73_m2} — ABNORMAL LOW (ref >59)

## 2023-03-01 ENCOUNTER — Telehealth: Payer: Self-pay

## 2023-03-01 NOTE — Telephone Encounter (Signed)
Dr. Meridee Score,   Pt is scheduled for 4 month EGD recall. Are you ok using 2 day clearance hold for his Eliquis or would you like for [place] to reach out for updated clearance from prescribing MD. Please advise.   Thank you,  Pre visit

## 2023-03-01 NOTE — Telephone Encounter (Signed)
If no other new changes in regards to cardiac or respiratory standpoint, we can use the same optimization. If he has had any issues, then get updated optimization.   GM

## 2023-03-17 ENCOUNTER — Ambulatory Visit (INDEPENDENT_AMBULATORY_CARE_PROVIDER_SITE_OTHER): Payer: 59 | Admitting: Internal Medicine

## 2023-03-17 VITALS — BP 171/104 | HR 75 | Temp 98.2°F | Ht 73.0 in | Wt 305.5 lb

## 2023-03-17 DIAGNOSIS — Z7901 Long term (current) use of anticoagulants: Secondary | ICD-10-CM

## 2023-03-17 DIAGNOSIS — E785 Hyperlipidemia, unspecified: Secondary | ICD-10-CM | POA: Diagnosis not present

## 2023-03-17 DIAGNOSIS — R69 Illness, unspecified: Secondary | ICD-10-CM

## 2023-03-17 DIAGNOSIS — I5042 Chronic combined systolic (congestive) and diastolic (congestive) heart failure: Secondary | ICD-10-CM

## 2023-03-17 DIAGNOSIS — E1169 Type 2 diabetes mellitus with other specified complication: Secondary | ICD-10-CM

## 2023-03-17 DIAGNOSIS — I4819 Other persistent atrial fibrillation: Secondary | ICD-10-CM

## 2023-03-17 DIAGNOSIS — E1142 Type 2 diabetes mellitus with diabetic polyneuropathy: Secondary | ICD-10-CM

## 2023-03-17 DIAGNOSIS — I152 Hypertension secondary to endocrine disorders: Secondary | ICD-10-CM

## 2023-03-17 DIAGNOSIS — Z7984 Long term (current) use of oral hypoglycemic drugs: Secondary | ICD-10-CM | POA: Diagnosis not present

## 2023-03-17 DIAGNOSIS — G4733 Obstructive sleep apnea (adult) (pediatric): Secondary | ICD-10-CM

## 2023-03-17 DIAGNOSIS — E559 Vitamin D deficiency, unspecified: Secondary | ICD-10-CM | POA: Diagnosis not present

## 2023-03-17 DIAGNOSIS — E1159 Type 2 diabetes mellitus with other circulatory complications: Secondary | ICD-10-CM | POA: Diagnosis not present

## 2023-03-17 DIAGNOSIS — Z0000 Encounter for general adult medical examination without abnormal findings: Secondary | ICD-10-CM

## 2023-03-17 DIAGNOSIS — Z8546 Personal history of malignant neoplasm of prostate: Secondary | ICD-10-CM

## 2023-03-17 DIAGNOSIS — Z8719 Personal history of other diseases of the digestive system: Secondary | ICD-10-CM

## 2023-03-17 DIAGNOSIS — I4811 Longstanding persistent atrial fibrillation: Secondary | ICD-10-CM | POA: Diagnosis not present

## 2023-03-17 NOTE — Progress Notes (Signed)
Internal Medicine Clinic Attending  I was physically present during the key portions of the resident provided service and participated in the medical decision making of patient's management care. I reviewed pertinent patient test results.  The assessment, diagnosis, and plan were formulated together and I agree with the documentation in the resident's note. We discovered with careful conversation that Terry Parks is overwhelmed with his chronic conditions and medications and isn't entirely sure what he is taking.  He feels generally poorly.  Plan for f/u with me to begin a systematic review of his issues. Given sporadic adherence, no changes in meds today.  Terry Aschoff, MD

## 2023-03-17 NOTE — Progress Notes (Signed)
67 y.o. Terry Parks is here for routine follow-up of medical complexity and polypharmacy noted to be overwhelming at his most recent visit with Dr. Carron Brazen.  Given limited availability for follow-up in the resident clinic, I offered to see him to continue the discussion of his challenges, which are abbreviated here:  Overwhelming medication regimen/quantity, even when packaged.  Wants to better understand indication and symptoms/SE's. Packaging confusing, getting too many boxes because not taking daily. Uses [facility] adherence packaging, mailed, for some but not all prescriptions. Doesn't know which pharmacy is dispensing which meds. Biggest concern is no energy, down mood, inability to do what he wants to do, feeling like brain/thinking/processing/memory/concentration have declined, interfering with his progress in school.  Since last visit, did an experiment to stop some meds to see what worked and what didn't; among the ones stopped was lasix - he reaccumulated swelling in legs (volume depleted/poor skin turgor at his most recent visit).  He has thus not been taking meds for a couple of weeks but not sure which ones by name. Didn't take any this morning.  Brought in a med list from home to review which was unfortunately dated 04/2022; and brought his current box of packaged [facility] mail-order meds which were actually dated 08/2022. He is understandably frustrated.  Experiencing UI from prostate surg; at night has urinary incont and awakens with soaked pull up. He doesn't sleep well and is exhausted during the day, further impacting his daytime energy.   Wanted to have hernia fixed, was trying to lose weight, then regained weight, frustrated.   Awakened recently with a "kink in neck on the right", hurts when swallows and when turning head, R side, beginning to improve. No new pillow or bed or sleeping position.   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   Depression 01/31/2019   Iron deficiency anemia 04/28/2018   CKD stage 3 due to type 2 diabetes mellitus (HCC) 02/11/2018   Healthcare maintenance 02/11/2018   Personal history of prostate cancer 10/06/2017   Longstanding persistent atrial fibrillation (HCC)    Vitamin D deficiency 06/22/2016   Allergic rhinitis 10/19/2012   Diabetic retinopathy (HCC) 10/04/2012   Type 2 diabetes mellitus with peripheral neuropathy (HCC) 04/08/2008   Obstructive sleep apnea    Hyperlipidemia associated with type 2 diabetes mellitus (HCC)    Hypertension associated with diabetes (HCC)    Chronic combined systolic and diastolic CHF (congestive heart failure) (HCC) 03/01/2006    Current Outpatient Medications:    amiodarone (PACERONE) 200 MG tablet, Take 1 tablet (200 mg total) by mouth every morning., Disp: 15 tablet, Rfl: 0   insulin glargine (LANTUS SOLOSTAR) 100 UNIT/ML Solostar Pen, Inject 30 Units into the skin 2 (two) times daily., Disp: 15 mL, Rfl: 11   JARDIANCE 25 MG TABS tablet, TAKE ONE TABLET BY MOUTH EVERY MORNING, Disp: 90 tablet, Rfl: 3   Accu-Chek Softclix Lancets lancets, Check blood sugar three times a day as instructed, Disp: 300 each, Rfl: 11   amLODipine (NORVASC) 5 MG tablet, TAKE ONE TABLET BY MOUTH EVERY EVENING, Disp: 30 tablet, Rfl: 6   apixaban (ELIQUIS) 5 MG TABS tablet, Take 1  tablet (5 mg total) by mouth 2 (two) times daily., Disp: 60 tablet, Rfl: 11   atorvastatin (LIPITOR) 40 MG tablet, TAKE ONE TABLET BY MOUTH EVERY EVENING, Disp: 30 tablet, Rfl: 6   Blood Glucose Monitoring Suppl (ACCU-CHEK GUIDE) w/Device KIT, 1 Units by Does not apply route 3 (three) times  daily., Disp: 1 kit, Rfl: 0   Blood Glucose Monitoring Suppl (BLOOD GLUCOSE MONITOR SYSTEM) w/Device KIT, Use up to 4 (four) times daily as directed, Disp: 1 kit, Rfl: 0   carvedilol (COREG) 25 MG tablet, TAKE ONE TABLET BY MOUTH EVERY MORNING and TAKE ONE TABLET BY MOUTH EVERY EVENING, Disp: 180 tablet, Rfl: 6   COMFORT EZ PEN NEEDLES 31G X 6 MM MISC, USE AS DIRECTED AT BEDTIME, Disp: 100 each, Rfl: 3   COMFORT EZ PEN NEEDLES 32G X 6 MM MISC, , Disp: , Rfl:    Dexlansoprazole (DEXILANT) 30 MG capsule DR, Take 1 capsule (30 mg total) by mouth 2 (two) times daily., Disp: 60 capsule, Rfl: 12   DULoxetine (CYMBALTA) 60 MG capsule, TAKE ONE CAPSULE BY MOUTH ONCE DAILY, Disp: 90 capsule, Rfl: 3   ezetimibe (ZETIA) 10 MG tablet, TAKE ONE TABLET BY MOUTH EVERY EVENING, Disp: 90 tablet, Rfl: 2   glucose blood (ACCU-CHEK GUIDE) test strip, USE TO check blood glucose THREE TIMES DAILY AS DIRECTED, Disp: 300 strip, Rfl: 11   oxybutynin (DITROPAN XL) 15 MG 24 hr tablet, Take 1 tablet (15 mg total) by mouth daily., Disp: 90 tablet, Rfl: 3   sacubitril-valsartan (ENTRESTO) 97-103 MG, TAKE ONE TABLET BY MOUTH EVERY MORNING and TAKE ONE TABLET BY MOUTH EVERY EVENING, Disp: 180 tablet, Rfl: 6   SITagliptin 50 MG TABS, Take 50 mg by mouth daily., Disp: 30 tablet, Rfl: 11   spironolactone (ALDACTONE) 50 MG tablet, TAKE ONE TABLET BY MOUTH EVERY MORNING, Disp: 30 tablet, Rfl: 6   torsemide (DEMADEX) 20 MG tablet, TAKE TWO TABLETS BY MOUTH EVERY MORNING (Patient taking differently: Take 40 mg by mouth daily.), Disp: 180 tablet, Rfl: 2  Functional Status: Independent; taking online classes for divinity. Mental processing and physical movement has slowed over time which worries him.  Objective BP (!) 171/104 (BP Location: Right Arm, Patient Position: Sitting, Cuff Size: Small)   Pulse 75   Temp 98.2 F (36.8 C) (Oral)   Ht 6\' 1"  (1.854 m)   Wt (!) 305 lb 8 oz (138.6 kg)   SpO2 100%   BMI 40.31 kg/m  Tired  appearing, dozes off during breaks in conversation.  Tenderness in right paracervical muscles but not of her spine.  Decreased range of motion due to discomfort.  Heart regular rate and rhythm today, no significant murmur.  Lungs are clear throughout.  Neck veins flat.  Skin turgor normal over upper body, and increased and legs which are very edematous, at least 2+ and symmetric.  Problems addressed:  Multiple chronic diseases Assessment & Plan: Mr. Joye is experiencing warranted frustration with his multiple conditions and polypharmacy.  To determine efficacy, he agreed to begin taking his medications as prescribed (new medication list reviewed with him before he left), including taking them on the morning of his next appointment.  We can adjust from there.  Medication list was cleaned up a bit and those which he has not been taking for some time were discontinued.  There is duplication of PPI (Dexlansoprazole prescribed by Dr. Meridee Score, and esomeprazole prescribed in our clinic.  I will discontinue the esomeprazole which has no  further refills.  Where possible, we will move to combination pills.  If possible, we should move to pharmacy consolidation.  Adherence packaging should be continued.  I will send a [facility] pharmacy consultation request.  Today we went through his updated medicine list; indications for each medicine were written on his paper.  He will bring ALL of his medications and previous medicine lists to next visit.   Type 2 diabetes mellitus with peripheral neuropathy (HCC) Assessment & Plan: A1c last month > 14; not taking glargine as prescribed but he will try the recommended 30 units twice a day until next visit.  Jardiance 25 mg and sitagliptin 50 mg are also prescribed.  First step is reassessing glucose control following period of consistent adherence.  Eye exam is scheduled.   Hyperlipidemia associated with type 2 diabetes mellitus (HCC) Assessment & Plan: 09/2022: TC 101, HDL  23, LDL 64 on atorvastatin 40 mg and ezetimibe 10 mg daily, inconsistent adherence.  Primary prevention.  Hypertension associated with diabetes (HCC) Assessment & Plan: 171/104 off all meds.  Prescribed amlodipine 5 mg daily, spironolactone 50 mg daily, Entresto 97-103 twice daily, carvedilol 25 mg twice daily, torsemide 20 mg 2 tabs daily.  Not sure if we have ever had an opportunity to see his response to his prescribed regimen.  He agrees to take his medicines as prescribed until next visit.  Obstructive sleep apnea Assessment & Plan: I have not yet looked into details of repeat sleep study and mask fitting intended last summer.  Uncontrolled severe OSA could certainly be contributing to his daytime fatigue and malaise.  Chronic combined systolic and diastolic CHF (congestive heart failure) (HCC) Assessment & Plan: Reminded Mr. Jeff that the cardiologists have been reaching out to get him scheduled for a long overdue appointment.  He stated that he would call the office to schedule.If possible, it would be ideal for adherence if the Entresto could be stopped and replaced with ARB once daily, changing carvedilol bid to long acting carv or to daily metoprolol, and continuing the spironolactone.   Vitamin D deficiency Assessment & Plan: Chart review-severe vitamin D deficiency-I failed to catch this before he left today.  He will need to recheck with next planned labs and will need to start supplementation, high dose.  Longstanding persistent atrial fibrillation [facility]) Assessment & Plan: Auscultates regular today.  On amiodarone and apixaban.  Healthcare maintenance Assessment & Plan: Colonoscopy 2020 inadequate prep; advised to repeat in one year. Doesn't appear that this occurred.     Return in about 3 weeks (around 04/07/2023).

## 2023-03-17 NOTE — Patient Instructions (Addendum)
We called for your most recent diabetes eye exam report today.   Reminder that you have an appointment with [facility] on Tuesday, Nov 5th at 915 AM  [name], Terry glad we could take time to talk through your complicated medical situation and medicine list.  We are going to work to get this streamlined for you.  First - please take all of your medicines, as prescribed, for now.  This will help me determine what is and isn't working, and where to go from here.  Please call Dr. Rubie Maid to make your heart follow up appointment.  I'm going to get some help from a pharmacist to sort out your medicines and make this as easy as we can.  Don't do anything different until you see me again in a couple of weeks.  There is hope! We'll get this all straightened out and help you feel better.  Dr. Mayford Knife

## 2023-03-21 ENCOUNTER — Encounter: Payer: Self-pay | Admitting: Internal Medicine

## 2023-03-21 DIAGNOSIS — R69 Illness, unspecified: Secondary | ICD-10-CM | POA: Insufficient documentation

## 2023-03-21 NOTE — Assessment & Plan Note (Signed)
I have not yet looked into details of repeat sleep study and mask fitting intended last summer.  Uncontrolled severe OSA could certainly be contributing to his daytime fatigue and malaise.

## 2023-03-21 NOTE — Assessment & Plan Note (Signed)
Auscultates regular today.  On amiodarone and apixaban.

## 2023-03-21 NOTE — Assessment & Plan Note (Signed)
171/104 off all meds.  Prescribed amlodipine 5 mg daily, spironolactone 50 mg daily, Entresto 97-103 twice daily, carvedilol 25 mg twice daily, torsemide 20 mg 2 tabs daily.  Not sure if we have ever had an opportunity to see his response to his prescribed regimen.  He agrees to take his medicines as prescribed until next visit.

## 2023-03-21 NOTE — Assessment & Plan Note (Signed)
A1c last month > 14; not taking glargine as prescribed but he will try the recommended 30 units twice a day until next visit.  Jardiance 25 mg and sitagliptin 50 mg are also prescribed.  First step is reassessing glucose control following period of consistent adherence.  Eye exam is scheduled.

## 2023-03-21 NOTE — Assessment & Plan Note (Signed)
09/2022: TC 101, HDL 23, LDL 64 on atorvastatin 40 mg and ezetimibe 10 mg daily, inconsistent adherence.  Primary prevention.

## 2023-03-21 NOTE — Assessment & Plan Note (Addendum)
Reminded [name] that the cardiologists have been reaching out to get him scheduled for a long overdue appointment.  He stated that he would call the office to schedule.If possible, it would be ideal for adherence if the Entresto could be stopped and replaced with ARB once daily, changing carvedilol bid to long acting carv or to daily metoprolol, and continuing the spironolactone.

## 2023-03-21 NOTE — Assessment & Plan Note (Signed)
Chart review-severe vitamin D deficiency-I failed to catch this before he left today.  He will need to recheck with next planned labs and will need to start supplementation, high dose.

## 2023-03-21 NOTE — Assessment & Plan Note (Signed)
Terry Parks is experiencing warranted frustration with his multiple conditions and polypharmacy.  To determine efficacy, he agreed to begin taking his medications as prescribed (new medication list reviewed with him before he left), including taking them on the morning of his next appointment.  We can adjust from there.  Medication list was cleaned up a bit and those which he has not been taking for some time were discontinued.  There is duplication of PPI (Dexlansoprazole prescribed by Dr. Meridee Score, and esomeprazole prescribed in our clinic.  I will discontinue the esomeprazole which has no further refills.  Where possible, we will move to combination pills.  If possible, we should move to pharmacy consolidation.  Adherence packaging should be continued.  I will send a [facility] pharmacy consultation request.

## 2023-03-21 NOTE — Assessment & Plan Note (Signed)
Colonoscopy 2020 inadequate prep; advised to repeat in one year. Doesn't appear that this occurred.

## 2023-03-22 ENCOUNTER — Telehealth: Payer: Self-pay | Admitting: *Deleted

## 2023-03-22 NOTE — Progress Notes (Unsigned)
  Care Coordination  Outreach Note  03/22/2023 Name: Braelyn Bordonaro MRN: [id number] DOB: 01/05/1956   Care Coordination Outreach Attempts: An unsuccessful telephone outreach was attempted today to offer the patient information about available care coordination services.  Follow Up Plan:  Additional outreach attempts will be made to offer the patient care coordination information and services.   Encounter Outcome:  No Answer  Gwenevere Ghazi  Care Coordination Care Guide  Direct Dial: [phone]

## 2023-03-23 NOTE — Progress Notes (Signed)
  Care Coordination   Note   03/23/2023 Name: Brandn Mcgath MRN: [id number] DOB: 09/17/1955  Marquie Aderhold is a 67 y.o. year old male who sees Nooruddin, Jason Fila, MD for primary care. I reached out to [name] by phone today to offer care coordination services.  Mr. Sweda was given information about Care Coordination services today including:   The Care Coordination services include support from the care team which includes your Nurse Coordinator, Clinical Social Worker, or Pharmacist.  The Care Coordination team is here to help remove barriers to the health concerns and goals most important to you. Care Coordination services are voluntary, and the patient may decline or stop services at any time by request to their care team member.   Care Coordination Consent Status: Patient agreed to services and verbal consent obtained.   Follow up plan:  Telephone appointment with care coordination team member scheduled for:  03/24/23 with [facility] and PharmD 11/14  Encounter Outcome:  Patient Scheduled  Gwenevere Ghazi  Care Coordination Care Guide  Direct Dial: [phone]

## 2023-03-24 ENCOUNTER — Ambulatory Visit: Payer: Self-pay

## 2023-03-24 NOTE — Patient Outreach (Signed)
Care Coordination   Initial Visit Note   03/24/2023 Name: Terry Parks MRN: [id number] DOB: 1955/12/27  Terry Parks is a 67 y.o. year old male who sees Nooruddin, Jason Fila, MD for primary care. I spoke with  Terry Parks by phone today.  What matters to the patients health and wellness today?  I spoke with [name], who mentioned that he lives alone. He has gotten off track with his medications, and given his health history--which includes congestive heart failure, high blood pressure, and diabetes--this is concerning. During his last office visit, his blood pressure was recorded at 171/104, his weight was 305 pounds, and his previous A1C was 14.0. We discussed his diet and reviewed his medications, including those for congestive heart failure, blood pressure, and blood sugar management. We also talked about the importance of exercise. I will be sending him information about all three of his chronic illnesses for him to review and discuss during our next call. I didn't want to overwhelm him today, but he appreciated the information we covered. [name] plans to order a new blood pressure monitor from his catalog at [employer] and is committed to getting back on track by taking his medications as prescribed. He has my number if he has any questions or concerns.    Goals Addressed               This Visit's Progress     Diabetes, HTN,  CHF, Patient stated goal- to improve my health (pt-stated)        Patient Goals/Self Care Activities: -Patient/Caregiver will take medications as prescribed   -Patient/Caregiver will attend all scheduled provider appointments -Patient/Caregiver will call pharmacy for medication refills 3-7 days in advance of running out of medications -Patient/Caregiver will call provider office for new concerns or questions  -Patient/Caregiver will focus on medication adherence by taking medications as prescribed    Provided education on low sodium  diet Provided education about placing scale on hard, flat surface Advised patient to weigh each morning after emptying bladder Discussed importance of daily weight and advised patient to weigh and record daily Reviewed role of diuretics in prevention of fluid overload and management of heart failure; Discussed the importance of keeping all appointments with provider Provided patient with education about the role of exercise in the management of heart failure Wt Readings from Last 3 Encounters:  03/17/23 (!) 305 lb 8 oz (138.6 kg)  02/23/23 280 lb 8 oz (127.2 kg)  11/24/22 280 lb (127 kg)     Reviewed medications with patient and discussed importance of medication adherence Counseled on importance of regular laboratory monitoring as prescribed Discussed plans with patient for ongoing care management follow up and provided patient with direct contact information for care management team Advised patient, providing education and rationale, to check cbg  and record, calling for findings outside established parameters Review of patient status, including review of consultants reports, relevant laboratory and other test results, and medications completed Lab Results  Component Value Date   HGBA1C >14.0 (A) 02/23/2023   Last practice recorded BP readings:  BP Readings from Last 3 Encounters:  03/17/23 (!) 171/104  02/23/23 119/74  11/24/22 (!) 148/91   Most recent eGFR/CrCl:  Lab Results  Component Value Date   EGFR 37 (L) 02/23/2023    No components found for: "CRCL"  Provided education to patient re: stroke prevention, s/s of heart attack and stroke Provided assistance with obtaining home blood pressure monitor via [employer]; Advised patient, providing  education and rationale, to monitor blood pressure daily and record, calling PCP for findings outside established parameters Discussed complications of poorly controlled blood pressure such as heart disease, stroke, circulatory  complications, vision complications, kidney impairment, sexual dysfunction         SDOH assessments and interventions completed:  Yes  SDOH Interventions Today    Flowsheet Row Most Recent Value  SDOH Interventions   Food Insecurity Interventions Intervention Not Indicated  Transportation Interventions Intervention Not Indicated        Care Coordination Interventions:  Yes, provided   Interventions Today    Flowsheet Row Most Recent Value  Chronic Disease   Chronic disease during today's visit Diabetes, Congestive Heart Failure (CHF), Hypertension (HTN)  General Interventions   General Interventions Discussed/Reviewed General Interventions Discussed, Durable Medical Equipment (DME)  Durable Medical Equipment (DME) BP Cuff, Glucomoter, Other  [scale]  Exercise Interventions   Exercise Discussed/Reviewed Exercise Discussed  [Silver sneakers]  Education Interventions   Education Provided Provided Education  Provided Verbal Education On Nutrition, Blood Sugar Monitoring, Medication  Nutrition Interventions   Nutrition Discussed/Reviewed Nutrition Discussed, Decreasing salt, Decreasing sugar intake, Portion sizes  Pharmacy Interventions   Pharmacy Dicussed/Reviewed Pharmacy Topics Discussed, Medication Adherence  Safety Interventions   Safety Discussed/Reviewed Safety Discussed        Follow up plan: Follow up call scheduled for 04/07/23  1130  am    Encounter Outcome:  Patient Visit Completed   Juanell Fairly RN, BSN, [facility]  [facility], [facility]  Care Coordinator Phone: [phone]

## 2023-03-24 NOTE — Patient Instructions (Signed)
Visit Information  Thank you for taking time to visit with me today. Please don't hesitate to contact me if I can be of assistance to you.   Following are the goals we discussed today:   Goals Addressed               This Visit's Progress     Diabetes, HTN,  CHF, Patient stated goal- to improve my health (pt-stated)        Patient Goals/Self Care Activities: -Patient/Caregiver will take medications as prescribed   -Patient/Caregiver will attend all scheduled provider appointments -Patient/Caregiver will call pharmacy for medication refills 3-7 days in advance of running out of medications -Patient/Caregiver will call provider office for new concerns or questions  -Patient/Caregiver will focus on medication adherence by taking medications as prescribed    Provided education on low sodium diet Provided education about placing scale on hard, flat surface Advised patient to weigh each morning after emptying bladder Discussed importance of daily weight and advised patient to weigh and record daily Reviewed role of diuretics in prevention of fluid overload and management of heart failure; Discussed the importance of keeping all appointments with provider Provided patient with education about the role of exercise in the management of heart failure Wt Readings from Last 3 Encounters:  03/17/23 (!) 305 lb 8 oz (138.6 kg)  02/23/23 280 lb 8 oz (127.2 kg)  11/24/22 280 lb (127 kg)     Reviewed medications with patient and discussed importance of medication adherence Counseled on importance of regular laboratory monitoring as prescribed Discussed plans with patient for ongoing care management follow up and provided patient with direct contact information for care management team Advised patient, providing education and rationale, to check cbg  and record, calling for findings outside established parameters Review of patient status, including review of consultants reports, relevant  laboratory and other test results, and medications completed Lab Results  Component Value Date   HGBA1C >14.0 (A) 02/23/2023   Last practice recorded BP readings:  BP Readings from Last 3 Encounters:  03/17/23 (!) 171/104  02/23/23 119/74  11/24/22 (!) 148/91   Most recent eGFR/CrCl:  Lab Results  Component Value Date   EGFR 37 (L) 02/23/2023    No components found for: "CRCL"  Provided education to patient re: stroke prevention, s/s of heart attack and stroke Provided assistance with obtaining home blood pressure monitor via [employer]; Advised patient, providing education and rationale, to monitor blood pressure daily and record, calling PCP for findings outside established parameters Discussed complications of poorly controlled blood pressure such as heart disease, stroke, circulatory complications, vision complications, kidney impairment, sexual dysfunction         Our next appointment is by telephone on 04/07/23   at 1130 am  Please call the care guide team at [phone] if you need to cancel or reschedule your appointment.   If you are experiencing a Mental Health or Behavioral Health Crisis or need someone to talk to, please call 1-800-273-TALK (toll free, 24 hour hotline)  Patient verbalizes understanding of instructions and care plan provided today and agrees to view in MyChart. Active MyChart status and patient understanding of how to access instructions and care plan via MyChart confirmed with patient.     Juanell Fairly RN, BSN, [facility]  [facility], [facility] Coordinator  Phone: [phone]

## 2023-03-30 ENCOUNTER — Encounter: Payer: Medicare Other | Admitting: Gastroenterology

## 2023-03-31 ENCOUNTER — Telehealth: Payer: Self-pay | Admitting: Pharmacist

## 2023-03-31 ENCOUNTER — Encounter: Payer: Self-pay | Admitting: *Deleted

## 2023-03-31 ENCOUNTER — Other Ambulatory Visit: Payer: Self-pay | Admitting: Pharmacist

## 2023-03-31 NOTE — Progress Notes (Signed)
   03/31/2023  Patient ID: Terry Parks, male   DOB: 1955-08-21, 67 y.o.   MRN: [id number]  Tried calling patient for our scheduled call at 11AM today for a medication management discussion. Unable to reach after 2 attempts. Left HIPAA compliant voicemail requesting call back at earliest convenience.   Of note for future call: - Compliance would be easiest with once daily medications (Carvedilol to Toprol XL, Entresto to Valsartan). However, cannot change Eliquis to Xarelto due to history of 3 days vomiting blood previously in Februrary 2024. - Need switched from [facility] to [facility] mail order for easier access to medications - Adherence packaging box last received on 01/19/23 for 30DS - Only med missing from list is Dexlansoprazole   Marlowe Aschoff, PharmD [facility] Phone Number: [phone]

## 2023-04-01 ENCOUNTER — Emergency Department (HOSPITAL_COMMUNITY)
Admission: EM | Admit: 2023-04-01 | Discharge: 2023-04-01 | Disposition: A | Discharge status: Home / Self Care | Payer: 59 | Attending: Emergency Medicine | Admitting: Emergency Medicine

## 2023-04-01 ENCOUNTER — Other Ambulatory Visit: Payer: Self-pay

## 2023-04-01 DIAGNOSIS — Z8546 Personal history of malignant neoplasm of prostate: Secondary | ICD-10-CM | POA: Diagnosis not present

## 2023-04-01 DIAGNOSIS — R739 Hyperglycemia, unspecified: Secondary | ICD-10-CM

## 2023-04-01 DIAGNOSIS — E1165 Type 2 diabetes mellitus with hyperglycemia: Secondary | ICD-10-CM | POA: Diagnosis not present

## 2023-04-01 DIAGNOSIS — R55 Syncope and collapse: Secondary | ICD-10-CM | POA: Insufficient documentation

## 2023-04-01 DIAGNOSIS — I110 Hypertensive heart disease with heart failure: Secondary | ICD-10-CM | POA: Diagnosis not present

## 2023-04-01 DIAGNOSIS — R531 Weakness: Secondary | ICD-10-CM | POA: Diagnosis not present

## 2023-04-01 DIAGNOSIS — Z7901 Long term (current) use of anticoagulants: Secondary | ICD-10-CM | POA: Insufficient documentation

## 2023-04-01 DIAGNOSIS — Z794 Long term (current) use of insulin: Secondary | ICD-10-CM | POA: Insufficient documentation

## 2023-04-01 DIAGNOSIS — J45909 Unspecified asthma, uncomplicated: Secondary | ICD-10-CM | POA: Insufficient documentation

## 2023-04-01 DIAGNOSIS — I509 Heart failure, unspecified: Secondary | ICD-10-CM | POA: Insufficient documentation

## 2023-04-01 DIAGNOSIS — R42 Dizziness and giddiness: Secondary | ICD-10-CM | POA: Diagnosis present

## 2023-04-01 LAB — URINALYSIS, ROUTINE W REFLEX MICROSCOPIC
Bacteria, UA: NONE SEEN
Bilirubin Urine: NEGATIVE
Glucose, UA: >=500 mg/dL — AB
Hgb urine dipstick: NEGATIVE
Ketones, ur: NEGATIVE mg/dL
Leukocytes,Ua: NEGATIVE
Nitrite: NEGATIVE
Protein, ur: NEGATIVE mg/dL
Specific Gravity, Urine: 1.01 (ref 1.005–1.030)
pH: 5 (ref 5.0–8.0)

## 2023-04-01 LAB — CBC
HCT: 47 % (ref 39.0–52.0)
Hemoglobin: 14.8 g/dL (ref 13.0–17.0)
MCH: 27 pg (ref 26.0–34.0)
MCHC: 31.5 g/dL (ref 30.0–36.0)
MCV: 85.6 fL (ref 80.0–100.0)
Platelets: 179 10*3/uL (ref 150–400)
RBC: 5.49 MIL/uL (ref 4.22–5.81)
RDW: 13.2 % (ref 11.5–15.5)
WBC: 8.4 10*3/uL (ref 4.0–10.5)
nRBC: 0 % (ref 0.0–0.2)

## 2023-04-01 LAB — TROPONIN I (HIGH SENSITIVITY)
Troponin I (High Sensitivity): 13 ng/L (ref <18)
Troponin I (High Sensitivity): 16 ng/L (ref <18)

## 2023-04-01 LAB — COMPREHENSIVE METABOLIC PANEL
ALT: UNDETERMINED U/L (ref 0–44)
AST: UNDETERMINED U/L (ref 15–41)
Albumin: 3.4 g/dL — ABNORMAL LOW (ref 3.5–5.0)
Alkaline Phosphatase: UNDETERMINED U/L (ref 38–126)
Anion gap: 17 — ABNORMAL HIGH (ref 5–15)
BUN: 16 mg/dL (ref 8–23)
CO2: 19 mmol/L — ABNORMAL LOW (ref 22–32)
Calcium: 8.7 mg/dL — ABNORMAL LOW (ref 8.9–10.3)
Chloride: 102 mmol/L (ref 98–111)
Creatinine, Ser: 1.54 mg/dL — ABNORMAL HIGH (ref 0.61–1.24)
GFR, Estimated: 50 mL/min — ABNORMAL LOW (ref >60)
Glucose, Bld: 388 mg/dL — ABNORMAL HIGH (ref 70–99)
Potassium: 4.2 mmol/L (ref 3.5–5.1)
Sodium: 138 mmol/L (ref 135–145)
Total Bilirubin: UNDETERMINED mg/dL (ref <1.2)
Total Protein: 7 g/dL (ref 6.5–8.1)

## 2023-04-01 LAB — CBG MONITORING, ED
Glucose-Capillary: 385 mg/dL — ABNORMAL HIGH (ref 70–99)
Glucose-Capillary: 275 mg/dL — ABNORMAL HIGH (ref 70–99)
Glucose-Capillary: 281 mg/dL — ABNORMAL HIGH (ref 70–99)

## 2023-04-01 LAB — BETA-HYDROXYBUTYRIC ACID: Beta-Hydroxybutyric Acid: 0.07 mmol/L (ref 0.05–0.27)

## 2023-04-01 MED ORDER — INSULIN ASPART 100 UNIT/ML IJ SOLN
10.0000 [IU] | Freq: Three times a day (TID) | INTRAMUSCULAR | Status: DC
  Administered 2023-04-01: 8 [IU] via SUBCUTANEOUS

## 2023-04-01 NOTE — ED Triage Notes (Signed)
Pt. Stated, When I got up this morning I was so weak and feeling bad I thought I would pass out.

## 2023-04-01 NOTE — Inpatient Diabetes Management (Signed)
Inpatient Diabetes Program Recommendations  AACE/ADA: New Consensus Statement on Inpatient Glycemic Control (2015)  Target Ranges:  Prepandial:   less than 140 mg/dL      Peak postprandial:   less than 180 mg/dL (1-2 hours)      Critically ill patients:  140 - 180 mg/dL   Lab Results  Component Value Date   GLUCAP 385 (H) 04/01/2023   HGBA1C >14.0 (A) 02/23/2023    Review of Glycemic Control  Latest Reference Range & Units 04/01/23 12:49  Glucose-Capillary 70 - 99 mg/dL 409 (H)  (H): Data is abnormally high Diabetes history: Type 2 DM Outpatient Diabetes medications: Lantus 30 units BID, Sitagliptin 50 mg every day, Jardiance 25 mg QD Current orders for Inpatient glycemic control: none  Inpatient Diabetes Program Recommendations:    Awaiting labs results at this time to determine plan of care.   Spoke with patient regarding outpatient diabetes management. Patient denies missing dosing of insulin since previous PCP appointment in October. He states, "I am doing better with sticking myself." Missed AM dose of Lantus for today since he came to ED. Reviewed patient's previous A1c of 14.0%. Explained what a A1c is and what it measures. Also reviewed goal A1c with patient, importance of good glucose control @ home, and blood sugar goals. Reviewed basic patho of DM, current glucose on presentation, survival skills, interventions, importance of establishing good glycemic control, vascular changes and commorbidities.  Patient has a meter and supplies. Requesting a new one because his at home always reads "Hi". Explained the purpose behind this reading and what to do regarding the value. Patient has another PCP appointment scheduled. Reviewed when to make another appointment based on glucose values.  Admits to drinking large amount of sodas. He states, "It is like my drug, I try to get it off of it and keep coming back. I know I should not drink it and will try again because I want to live."  Reviewed alternatives to sugary beverages. Patient has no additional questions at this time.   Thanks, Lujean Rave, MSN, RNC-[facility] Diabetes Coordinator [phone] (8a-5p)

## 2023-04-01 NOTE — ED Provider Notes (Signed)
[place] EMERGENCY DEPARTMENT AT [facility] Provider Note   CSN: [id number] Arrival date & time: 04/01/23  1027     History {Add pertinent medical, surgical, social history, OB history to HPI:1} Chief Complaint  Patient presents with   Weakness   Near Syncope    Terry Parks is a 67 y.o. male with PMH as listed below who presents with near syncope, generalized weakness. Thought he might pass out when he got up this AM. No h/o similar. Was in his NSOH yesterday. Denies CP, SOB, cough, f/c, abd pain, N/V/D/C, urinary sxs, headache, FNDs. Had some blurry vision when he was lightheaded. Now feels fatigued and very mildly lightheaded but improved from earlier. Did not fall or hit his head, he went to lay down in his bed.  Has diabetes and didn't take his insulin or check his sugar this AM.  Most recent visit with internal medicine on 03/17/23. Noted overwhelm with polypharmacy and had done some experiments at home to stop meds to see which he didn't need including stopping lasix, and had reaccumulated swelling in his legs. States he is back on his medication now.  Past Medical History:  Diagnosis Date   Abscessed tooth    top back large cavity no pain or drainage, one on bottom  pt pulled tooth 4-5 months ago, right top large hole in tooth   AKI (acute kidney injury) (HCC)    Allergic rhinitis    Anemia    Anxiety    Asthma    Atrial fibrillation (HCC)    BPH (benign prostatic hypertrophy)    Massive BPH noted on cystoscopy 1/23/ 2012 by Dr. Isabel Caprice.   Cancer [facility])    prostate cancer 2019   Cardiomyopathy [facility])    CHF (congestive heart failure) (HCC)    Cough 03/30/2012   Depression    Diabetes mellitus 04/08/2008   type 2   Dyspnea    Dysrhythmia 2019   Foley catheter in place 07-05-17 placed   Fracture, orbital (HCC) 2021   Right   GERD (gastroesophageal reflux disease) 2020   Headache(784.0)    hx migraines none recent   History of esophagitis 12/16/2019    Hyperlipemia    Hypertension    Hypertensive cardiopathy 03/01/2006   2-D echocardiogram 02/01/2012 showed moderate LVH, mildly to moderately reduced left ventricular systolic function with an estimated ejection fraction of 40-45%, and diffuse hypokinesis.  A nuclear medicine stress study done 01/31/2012 showed no reversible ischemia, a small mid anterior wall fixed defect/infarct, and ejection fraction 42%.       Neck pain    Nephrolithiasis 05/29/2010   CT scan of abdomen/pelvis on 05/29/2010 showed an obstructing approximate 1-2 mm calculus at the left UVJ, and an approximate 1-2 mm left lower pole renal calculus.   Patient had continuing severe pain , and an elevation of his serum creatinine to a value of 1.75 on 06/06/2010.  Patient underwent cystoscopy on 06/08/2010 by Dr. Isabel Caprice, but attempts at retrograde pyelogram and ureteroscopy were unsucc   Numbness 01/08/2018   Obstructive sleep apnea 03/06/2008   Sleep study 03/06/08 showed severe OSA/hypopnea syndrome, with successful CPAP titration to 13 CWP using a medium ResMed Mirage Quattro full face mask with heated humidifier.    Personal history of prostate cancer 10/06/2017   Rash 04/17/2014   Renal calculus 05/29/2010   CT scan of abdomen/pelvis on 05/29/2010 showed an obstructing approximate 1-2 mm calculus at the left UVJ, and an approximate 1-2 mm left lower  pole renal calculus.   Patient had continuing severe pain , and an elevation of his serum creatinine to a value of 1.75 on 06/06/2010.  The stone had apparently passed and was not seen on repeat CT 06/08/2010.   Sleep apnea    haven't use cpap in 2 years   Tooth pain 10/29/2017   Urinary straining 11/02/2016       Home Medications Prior to Admission medications   Medication Sig Start Date End Date Taking? Authorizing Provider  Accu-Chek Softclix Lancets lancets Check blood sugar three times a day as instructed 05/13/22   Steffanie Rainwater, MD  amiodarone (PACERONE) 200 MG  tablet Take 1 tablet (200 mg total) by mouth every morning. 01/20/23   [name], [name], MD  amLODipine (NORVASC) 5 MG tablet TAKE ONE TABLET BY MOUTH EVERY EVENING 08/12/22   [name], MD  apixaban (ELIQUIS) 5 MG TABS tablet Take 1 tablet (5 mg total) by mouth 2 (two) times daily. 10/12/22 10/07/23  [name], MD  atorvastatin (LIPITOR) 40 MG tablet TAKE ONE TABLET BY MOUTH EVERY EVENING 08/12/22   [name], MD  Blood Glucose Monitoring Suppl (ACCU-CHEK GUIDE) w/Device KIT 1 Units by Does not apply route 3 (three) times daily. 11/25/20   [name], MD  Blood Glucose Monitoring Suppl (BLOOD GLUCOSE MONITOR SYSTEM) w/Device KIT Use up to 4 (four) times daily as directed 10/16/21   Andrey Campanile, MD  carvedilol (COREG) 25 MG tablet TAKE ONE TABLET BY MOUTH EVERY MORNING and TAKE ONE TABLET BY MOUTH EVERY EVENING 08/12/22   [name], MD  COMFORT EZ PEN NEEDLES 31G X 6 MM MISC USE AS DIRECTED AT BEDTIME 06/23/21   [name], MD  COMFORT EZ PEN NEEDLES 32G X 6 MM MISC  05/25/21   [provider]  Dexlansoprazole (DEXILANT) 30 MG capsule DR Take 1 capsule (30 mg total) by mouth 2 (two) times daily. 11/24/22   Mansouraty, [name], MD  DULoxetine (CYMBALTA) 60 MG capsule TAKE ONE CAPSULE BY MOUTH ONCE DAILY 11/10/22   Nooruddin, Jason Fila, MD  ezetimibe (ZETIA) 10 MG tablet TAKE ONE TABLET BY MOUTH EVERY EVENING 05/13/22   [name], [name], MD  glucose blood (ACCU-CHEK GUIDE) test strip USE TO check blood glucose THREE TIMES DAILY AS DIRECTED 05/13/22   Steffanie Rainwater, MD  insulin glargine (LANTUS SOLOSTAR) 100 UNIT/ML Solostar Pen Inject 30 Units into the skin 2 (two) times daily. 02/01/23   Nooruddin, Jason Fila, MD  JARDIANCE 25 MG TABS tablet TAKE ONE TABLET BY MOUTH EVERY MORNING 11/10/22   Nooruddin, Jason Fila, MD  oxybutynin (DITROPAN XL) 15 MG 24 hr tablet Take 1 tablet (15 mg total) by mouth daily. 10/28/22     sacubitril-valsartan (ENTRESTO) 97-103 MG TAKE ONE TABLET BY MOUTH  EVERY MORNING and TAKE ONE TABLET BY MOUTH EVERY EVENING 08/12/22   [name], MD  SITagliptin 50 MG TABS Take 50 mg by mouth daily. 10/12/22   [name], MD  spironolactone (ALDACTONE) 50 MG tablet TAKE ONE TABLET BY MOUTH EVERY MORNING 08/12/22   [name], MD  torsemide (DEMADEX) 20 MG tablet TAKE TWO TABLETS BY MOUTH EVERY MORNING Patient taking differently: Take 40 mg by mouth daily. 04/20/22   [name], [name], MD      Allergies    Lisinopril    Review of Systems   Review of Systems A 10 point review of systems was performed and is negative unless otherwise reported in HPI.  Physical Exam Updated Vital Signs BP 119/76  Pulse 73   Temp 97.9 F (36.6 C)   Resp 14   Ht 6\' 1"  (1.854 m)   Wt 133.8 kg   SpO2 98%   BMI 38.92 kg/m  Physical Exam General: Normal appearing male, lying in bed.  HEENT: PERRLA, EOMI, Sclera anicteric, MMM, trachea midline. NCAT.  Cardiology: RRR, no murmurs/rubs/gallops. BL radial and DP pulses equal bilaterally.  Resp: Normal respiratory rate and effort. CTAB, no wheezes, rhonchi, crackles.  Abd: Soft, non-tender, non-distended. No rebound tenderness or guarding.  GU: Deferred. MSK: 1+ pitting LE edema, symmetric bilaterally. No signs of trauma. Extremities without deformity or TTP. No cyanosis or clubbing. Skin: warm, dry.  Back: No CVA tenderness Neuro: A&Ox4, CNs II-XII grossly intact. MAEs. Sensation grossly intact. Normal speech. Psych: Normal mood and affect.   ED Results / Procedures / Treatments   Labs (all labs ordered are listed, but only abnormal results are displayed) Labs Reviewed  CBG MONITORING, ED - Abnormal; Notable for the following components:      Result Value   Glucose-Capillary 385 (*)    All other components within normal limits  CBC  URINALYSIS, ROUTINE W REFLEX MICROSCOPIC  COMPREHENSIVE METABOLIC PANEL  TROPONIN I (HIGH SENSITIVITY)    EKG EKG Interpretation Date/Time:  Friday April 01 2023  10:39:35 EST Ventricular Rate:  72 PR Interval:  142 QRS Duration:  92 QT Interval:  446 QTC Calculation: 488 R Axis:   103  Text Interpretation: Normal sinus rhythm Left anterior fasicular block Confirmed by Vivi Barrack [phone]) on 04/01/2023 12:23:51 PM  Radiology No results found.  Procedures Procedures  {Document cardiac monitor, telemetry assessment procedure when appropriate:1}  Medications Ordered in ED Medications - No data to display  ED Course/ Medical Decision Making/ A&P                          Medical Decision Making Amount and/or Complexity of Data Reviewed Labs: ordered.    This patient presents to the ED for concern of pre-syncope, this involves an extensive number of treatment options, and is a complaint that carries with it a high risk of complications and morbidity.  I considered the following differential and admission for this acute, potentially life threatening condition.   MDM:    DDX for pre-syncope includes but is not limited to:  Consider hypo/hyperglycemia, anemia and electrolyte abnormalities as possible etiologies. Consider arrhythmias and ACS, although without associated symptoms, less likely. EKG is NSR without arrhythmia or signs of ischemia. Consider hemorrhage vs CVA, although neuro intact, much less likely.   *** Given history, exam and workup, low suspicion for HF, ICH (no trauma, headache), seizure (no witnessed seizure like activity, no postictal period, tongue laceration, bladder incontinence), stroke (no focal neuro deficits), HOCM (no murmur, family history of sudden death), ACS (neg troponin, no anginal pain), aortic dissection (no chest pain), malignant arrhythmia on ekg or any family history of sudden death, or GI bleed (stable hgb). Low suspicion for PE given normal vital signs, absence of chest pain or dyspnea, no evidence of DVT, no recent surgery/immobilization. Based on canadian syncope rule, patient is low risk and well  appearing here, plan to discharge the patient home with PMD follow up. ***   Clinical Course as of 04/01/23 1252  Fri Apr 01, 2023  1252 Glucose-Capillary(!): 385 [HN]    Clinical Course User Index [HN] Loetta Rough, MD    Labs: I Ordered, and personally interpreted labs.  The  pertinent results include:  those listed above  Imaging Studies ordered: I ordered imaging studies including *** I independently visualized and interpreted imaging. I agree with the radiologist interpretation  Additional history obtained from chart review.  External records from outside source obtained and reviewed including ***  Cardiac Monitoring: The patient was maintained on a cardiac monitor.  I personally viewed and interpreted the cardiac monitored which showed an underlying rhythm of: NSR  Reevaluation: After the interventions noted above, I reevaluated the patient and found that they have :improved  Social Determinants of Health: Lives independently  Disposition:  ***  Co morbidities that complicate the patient evaluation  Past Medical History:  Diagnosis Date   Abscessed tooth    top back large cavity no pain or drainage, one on bottom  pt pulled tooth 4-5 months ago, right top large hole in tooth   AKI (acute kidney injury) (HCC)    Allergic rhinitis    Anemia    Anxiety    Asthma    Atrial fibrillation (HCC)    BPH (benign prostatic hypertrophy)    Massive BPH noted on cystoscopy 1/23/ 2012 by Dr. Isabel Caprice.   Cancer [facility])    prostate cancer 2019   Cardiomyopathy [facility])    CHF (congestive heart failure) (HCC)    Cough 03/30/2012   Depression    Diabetes mellitus 04/08/2008   type 2   Dyspnea    Dysrhythmia 2019   Foley catheter in place 07-05-17 placed   Fracture, orbital (HCC) 2021   Right   GERD (gastroesophageal reflux disease) 2020   Headache(784.0)    hx migraines none recent   History of esophagitis 12/16/2019   Hyperlipemia    Hypertension    Hypertensive  cardiopathy 03/01/2006   2-D echocardiogram 02/01/2012 showed moderate LVH, mildly to moderately reduced left ventricular systolic function with an estimated ejection fraction of 40-45%, and diffuse hypokinesis.  A nuclear medicine stress study done 01/31/2012 showed no reversible ischemia, a small mid anterior wall fixed defect/infarct, and ejection fraction 42%.       Neck pain    Nephrolithiasis 05/29/2010   CT scan of abdomen/pelvis on 05/29/2010 showed an obstructing approximate 1-2 mm calculus at the left UVJ, and an approximate 1-2 mm left lower pole renal calculus.   Patient had continuing severe pain , and an elevation of his serum creatinine to a value of 1.75 on 06/06/2010.  Patient underwent cystoscopy on 06/08/2010 by Dr. Isabel Caprice, but attempts at retrograde pyelogram and ureteroscopy were unsucc   Numbness 01/08/2018   Obstructive sleep apnea 03/06/2008   Sleep study 03/06/08 showed severe OSA/hypopnea syndrome, with successful CPAP titration to 13 CWP using a medium ResMed Mirage Quattro full face mask with heated humidifier.    Personal history of prostate cancer 10/06/2017   Rash 04/17/2014   Renal calculus 05/29/2010   CT scan of abdomen/pelvis on 05/29/2010 showed an obstructing approximate 1-2 mm calculus at the left UVJ, and an approximate 1-2 mm left lower pole renal calculus.   Patient had continuing severe pain , and an elevation of his serum creatinine to a value of 1.75 on 06/06/2010.  The stone had apparently passed and was not seen on repeat CT 06/08/2010.   Sleep apnea    haven't use cpap in 2 years   Tooth pain 10/29/2017   Urinary straining 11/02/2016     Medicines No orders of the defined types were placed in this encounter.   I have reviewed the patients home medicines  and have made adjustments as needed  Problem List / ED Course: Problem List Items Addressed This Visit   None        {Document critical care time when appropriate:1} {Document review of labs and  clinical decision tools ie heart score, Chads2Vasc2 etc:1}  {Document your independent review of radiology images, and any outside records:1} {Document your discussion with family members, caretakers, and with consultants:1} {Document social determinants of health affecting pt's care:1} {Document your decision making why or why not admission, treatments were needed:1}  This note was created using dictation software, which may contain spelling or grammatical errors.

## 2023-04-01 NOTE — ED Provider Notes (Signed)
Patient presented to the ED for evaluation of generalized malaise.  Patient noted to be hyperglycemic initiated with glucose 388.  Patient had slightly increased anion gap.  However beta hydroxybutyrate acid level was normal.  Serial troponins were normal.  Patient has been feeling better he has been eating and drinking.  Low suspicion for DKA at this point.  Patient is feeling well.  He has no symptoms at this time.  He is comfortable with discharge.  Discussed close outpatient follow-up warning signs and precautions to return to the ED   Linwood Dibbles, MD 04/01/23 2016

## 2023-04-01 NOTE — Discharge Instructions (Signed)
Continue your home medications.  Return to the ED for worsening symptoms fevers chills.  Follow-up with your doctor next week to be rechecked.

## 2023-04-04 ENCOUNTER — Telehealth: Payer: Self-pay | Admitting: Pharmacist

## 2023-04-04 ENCOUNTER — Encounter: Payer: Self-pay | Admitting: Gastroenterology

## 2023-04-04 NOTE — Progress Notes (Signed)
   04/04/2023  Patient ID: Terry Parks, male   DOB: November 12, 1955, 67 y.o.   MRN: [id number]  Tried calling the patient to review medications (especially since recent ER visit) and left a HIPAA compliant voicemail requesting call back at earliest convenience. Tried calling son Dex as well per DPR and unable to leave voicemail. Will attempt to talk with the patient once more in the coming 1-2 weeks.    Marlowe Aschoff, PharmD [facility] Phone Number: [phone]

## 2023-04-07 ENCOUNTER — Ambulatory Visit: Payer: Self-pay

## 2023-04-07 ENCOUNTER — Encounter: Payer: 59 | Admitting: Student

## 2023-04-07 NOTE — Patient Instructions (Signed)
Visit Information  Thank you for taking time to visit with me today. Please don't hesitate to contact me if I can be of assistance to you.   Following are the goals we discussed today:   Goals Addressed               This Visit's Progress     Diabetes, HTN,  CHF, Patient stated goal- to improve my health (pt-stated)        Patient Goals/Self Care Activities: -Patient/Caregiver will take medications as prescribed   -Patient/Caregiver will attend all scheduled provider appointments -Patient/Caregiver will call pharmacy for medication refills 3-7 days in advance of running out of medications -Patient/Caregiver will call provider office for new concerns or questions  -Patient/Caregiver will focus on medication adherence by taking medications as prescribed    Provided education on low sodium diet Provided education about placing scale on hard, flat surface Advised patient to weigh each morning after emptying bladder Discussed importance of daily weight and advised patient to weigh and record daily Reviewed role of diuretics in prevention of fluid overload and management of heart failure; Discussed the importance of keeping all appointments with provider Provided patient with education about the role of exercise in the management of heart failure Wt Readings from Last 3 Encounters:  04/01/23 295 lb (133.8 kg)  03/17/23 (!) 305 lb 8 oz (138.6 kg)  02/23/23 280 lb 8 oz (127.2 kg)     Reviewed medications with patient and discussed importance of medication adherence Counseled on importance of regular laboratory monitoring as prescribed Advised patient, providing education and rationale, to check cbg  and record, calling for findings outside established parameters Review of patient status, including review of consultants reports, relevant laboratory and other test results, and medications completed Lab Results  Component Value Date   HGBA1C >14.0 (A) 02/23/2023   Last practice  recorded BP readings:  BP Readings from Last 3 Encounters:  04/01/23 111/68  03/17/23 (!) 171/104  02/23/23 119/74   Most recent eGFR/CrCl:  Lab Results  Component Value Date   EGFR 37 (L) 02/23/2023    No components found for: "CRCL"  Provided education to patient re: stroke prevention, s/s of heart attack and stroke Provided assistance with obtaining home blood pressure monitor via [employer], retail; Advised patient, providing education and rationale, to monitor blood pressure daily and record, calling PCP for findings outside established parameters Discussed complications of poorly controlled blood pressure such as heart disease, stroke, circulatory complications, vision complications, kidney impairment, sexual dysfunction Make sure to obtain your BP monitor and glucometer to check and write your values         Our next appointment is by telephone on 05/04/23 at 1130 am  Please call the care guide team at [phone] if you need to cancel or reschedule your appointment.   If you are experiencing a Mental Health or Behavioral Health Crisis or need someone to talk to, please call 1-800-273-TALK (toll free, 24 hour hotline)  Patient verbalizes understanding of instructions and care plan provided today and agrees to view in MyChart. Active MyChart status and patient understanding of how to access instructions and care plan via MyChart confirmed with patient.     Juanell Fairly RN, BSN, [facility] [place]  [facility]  Care Coordinator Phone: [phone]

## 2023-04-07 NOTE — Patient Outreach (Signed)
Care Coordination   Follow Up Visit Note   04/07/2023 Name: Terry Parks MRN: [id number] DOB: 1955/11/03  Terry Parks is a 67 y.o. year old male who sees Nooruddin, Jason Fila, MD for primary care. I spoke with  Terry Parks by phone today.  What matters to the patients health and wellness today?  Terry Parks visited the emergency room on 11/15 due to weakness and syncope. He reported no chest pain, shortness of breath, or cough. His blood sugar was slightly elevated at 385, but his blood pressure was within normal limits at 118/68. When I asked him if he had been weighing himself, he admitted that he had forgotten but planned to move his scale to the bathroom as a reminder. He again denied any chest pain, shortness of breath, or swelling and stated that he was taking his medication. However, he did not check his blood pressure at home today; he measured it outside and found it to be 114/93. He also mentioned that he hasn't been able to check his blood sugar levels. I advised him that his doctor could call in a prescription to help him obtain a glucometer. Terry Parks has an appointment with Dr. Mayford Knife scheduled for December 9th, during which he wants to request the prescription. I emphasized the importance of regularly checking his blood pressure and blood sugar, and he agreed to start recording these values.      Goals Addressed               This Visit's Progress     Diabetes, HTN,  CHF, Patient stated goal- to improve my health (pt-stated)        Patient Goals/Self Care Activities: -Patient/Caregiver will take medications as prescribed   -Patient/Caregiver will attend all scheduled provider appointments -Patient/Caregiver will call pharmacy for medication refills 3-7 days in advance of running out of medications -Patient/Caregiver will call provider office for new concerns or questions  -Patient/Caregiver will focus on medication adherence by taking medications as  prescribed    Provided education on low sodium diet Provided education about placing scale on hard, flat surface Advised patient to weigh each morning after emptying bladder Discussed importance of daily weight and advised patient to weigh and record daily Reviewed role of diuretics in prevention of fluid overload and management of heart failure; Discussed the importance of keeping all appointments with provider Provided patient with education about the role of exercise in the management of heart failure Wt Readings from Last 3 Encounters:  04/01/23 295 lb (133.8 kg)  03/17/23 (!) 305 lb 8 oz (138.6 kg)  02/23/23 280 lb 8 oz (127.2 kg)     Reviewed medications with patient and discussed importance of medication adherence Counseled on importance of regular laboratory monitoring as prescribed Advised patient, providing education and rationale, to check cbg  and record, calling for findings outside established parameters Review of patient status, including review of consultants reports, relevant laboratory and other test results, and medications completed Lab Results  Component Value Date   HGBA1C >14.0 (A) 02/23/2023   Last practice recorded BP readings:  BP Readings from Last 3 Encounters:  04/01/23 111/68  03/17/23 (!) 171/104  02/23/23 119/74   Most recent eGFR/CrCl:  Lab Results  Component Value Date   EGFR 37 (L) 02/23/2023    No components found for: "CRCL"  Provided education to patient re: stroke prevention, s/s of heart attack and stroke Provided assistance with obtaining home blood pressure monitor via [employer], retail; Advised patient, providing education and  rationale, to monitor blood pressure daily and record, calling PCP for findings outside established parameters Discussed complications of poorly controlled blood pressure such as heart disease, stroke, circulatory complications, vision complications, kidney impairment, sexual dysfunction Make sure to obtain your  BP monitor and glucometer to check and write your values         SDOH assessments and interventions completed:  No     Care Coordination Interventions:  Yes, provided   Interventions Today    Flowsheet Row Most Recent Value  Chronic Disease   Chronic disease during today's visit Diabetes, Hypertension (HTN), Congestive Heart Failure (CHF)  General Interventions   General Interventions Discussed/Reviewed General Interventions Discussed, General Interventions Reviewed  Durable Medical Equipment (DME) BP Cuff, Glucomoter  Pharmacy Interventions   Pharmacy Dicussed/Reviewed Pharmacy Topics Discussed  Safety Interventions   Safety Discussed/Reviewed Safety Discussed        Follow up plan: Follow up call scheduled for 05/04/23  1130 am    Encounter Outcome:  Patient Visit Completed   Juanell Fairly RN, BSN, [facility]   [facility], [facility]  Care Coordinator Phone: [phone]

## 2023-04-11 ENCOUNTER — Other Ambulatory Visit: Payer: Self-pay | Admitting: Pharmacist

## 2023-04-11 NOTE — Progress Notes (Signed)
   04/11/2023  Patient ID: Terry Parks, male   DOB: 04-07-1956, 67 y.o.   MRN: [id number]  Called and briefly spoke on the phone with the patient today as he was leaving [employer]. He reports that he still has 3 times a day medications being sent from [facility]. Said they called recently to send more to him, but he refused the shipment stating he currently has plenty. Advised that is very concerning since his last shipment was sent on 01/19/23 for a 30DS.   When asked about adherence, said they are in blister packs but he forget to bring them with him when he leaves in the morning. He usually forgets the morning, potentially misses midday, and remembers the nighttime medications. Currently needs more pen needles for his Lantus (taking 32 U twice a day)- will need to call the pharmacy and request more.   Would most prefer in-person appointment to place medications in pill boxes for the patient in twice a day dosing, but concerns the patient will sleep through out phone call or office visit next Thursday. I also do not want to do a home visit if the patient will not let me change his medications around anyways. Will plan for phone call first and assess for home visit afterwards.    Marlowe Aschoff, PharmD [facility] Phone Number: [phone]

## 2023-04-13 ENCOUNTER — Telehealth: Payer: Self-pay | Admitting: Emergency Medicine

## 2023-04-13 ENCOUNTER — Ambulatory Visit: Payer: 59 | Attending: Cardiovascular Disease | Admitting: Cardiovascular Disease

## 2023-04-13 ENCOUNTER — Telehealth: Payer: Self-pay | Admitting: Cardiothoracic Surgery

## 2023-04-13 VITALS — BP 110/72 | HR 90 | Ht 73.0 in | Wt 292.0 lb

## 2023-04-13 DIAGNOSIS — I10 Essential (primary) hypertension: Secondary | ICD-10-CM

## 2023-04-13 DIAGNOSIS — E1142 Type 2 diabetes mellitus with diabetic polyneuropathy: Secondary | ICD-10-CM

## 2023-04-13 DIAGNOSIS — I5042 Chronic combined systolic (congestive) and diastolic (congestive) heart failure: Secondary | ICD-10-CM | POA: Diagnosis not present

## 2023-04-13 DIAGNOSIS — Z79899 Other long term (current) drug therapy: Secondary | ICD-10-CM | POA: Diagnosis not present

## 2023-04-13 DIAGNOSIS — I480 Paroxysmal atrial fibrillation: Secondary | ICD-10-CM

## 2023-04-13 DIAGNOSIS — D6869 Other thrombophilia: Secondary | ICD-10-CM | POA: Diagnosis not present

## 2023-04-13 DIAGNOSIS — G4733 Obstructive sleep apnea (adult) (pediatric): Secondary | ICD-10-CM

## 2023-04-13 DIAGNOSIS — N1831 Chronic kidney disease, stage 3a: Secondary | ICD-10-CM | POA: Diagnosis not present

## 2023-04-13 DIAGNOSIS — E785 Hyperlipidemia, unspecified: Secondary | ICD-10-CM

## 2023-04-13 DIAGNOSIS — I428 Other cardiomyopathies: Secondary | ICD-10-CM

## 2023-04-13 NOTE — Patient Instructions (Signed)
Medication Instructions:  No changes *If you need a refill on your cardiac medications before your next appointment, please call your pharmacy*   Lab Work: CMP, AIC, TSH- today If you have labs (blood work) drawn today and your tests are completely normal, you will receive your results only by: MyChart Message (if you have MyChart) OR A paper copy in the mail If you have any lab test that is abnormal or we need to change your treatment, we will call you to review the results.   Follow-Up: At [facility], you and your health needs are our priority.  As part of our continuing mission to provide you with exceptional heart care, we have created designated Provider Care Teams.  These Care Teams include your primary Cardiologist (physician) and Advanced Practice Providers (APPs -  Physician Assistants and Nurse Practitioners) who all work together to provide you with the care you need, when you need it.  We recommend signing up for the patient portal called "MyChart".  Sign up information is provided on this After Visit Summary.  MyChart is used to connect with patients for Virtual Visits (Telemedicine).  Patients are able to view lab/test results, encounter notes, upcoming appointments, etc.  Non-urgent messages can be sent to your provider as well.   To learn more about what you can do with MyChart, go to [email or web address].    Your next appointment:   6 month(s)  Provider:   Thurmon Fair, MD

## 2023-04-13 NOTE — Progress Notes (Signed)
Patient ID: Terry Parks, male   DOB: [birth year], 67 y.o.   MRN: [id number]    Cardiology Office Note    Date:  04/13/2023   ID:  Terry Parks, DOB 04/20/1956, MRN [id number]  PCP:  Olegario Messier, MD  Cardiologist:   Thurmon Fair, MD   Chief Complaint  Patient presents with   Congestive Heart Failure     History of Present Illness:  Terry Parks is a 67 y.o. male with  combined systolic and diastolic heart failure (LVEF 35-40% by most recent echo October 2020) paroxysmal atrial fibrillation on amiodarone and Xarelto, HTN, OSA, DM. He has never had catheterization and has not had angina pectoris, but a nuclear stress test in 2013 showed a fixed anterior defect.  He is generally been doing well and has not required hospitalization for heart failure in the last year.  Last month he became lackadaisical about taking his medication since he restarted taking so many pills.  Shortly after that he began to feel more short of breath and developed swelling and his blood pressure was elevated.  He is now back on his usual medications and is feeling better.  He currently denies problems of shortness of breath or angina either at rest or with activity and does not report any extremity edema.  He has not had dizziness or syncope.  On exam he does have about 1+ ankle swelling.  His neck veins are not distended.  His weight is well below our previous assessment of his dry weight.  He has not had any new problems with hematemesis although he still thinks of something up his esophagus.  He had grade D esophagitis when he had endoscopy.  He is still on a proton pump inhibitor.  Most recent labs show very poor glycemic control and expected elevation in triglycerides and low HDL cholesterol, but his LDL was in desirable range while taking atorvastatin.  He has systemic hypertension, severe obesity, obstructive sleep apnea on CPAP, and diabetes mellitus. The diagnosis of heart failure dates back  to at least 2013 when he had a stress test that showed a fixed anterior defect and an ejection fraction of 30-35%.  He was hospitalized with acute heart failure exacerbation in January 2019, in the setting of acute kidney injury secondary to nephrolithiasis with ureteral obstruction.  During that same admission he had atrial fibrillation, treated with cardioversion but followed by recurrence on atrial flutter, treated with amiodarone and anticoagulation with rivaroxaban.  He last required hospitalization for heart failure exacerbation in March 2021.  Discharge weight was 318 pounds (144 kg and discharge creatinine was 1.57 (baseline 1.3-1.4).  He developed transient acute kidney injury during the hospitalization for incisional hernia repair November 2021, but his kidney function recovered rapidly.    Past Medical History:  Diagnosis Date   Abscessed tooth    top back large cavity no pain or drainage, one on bottom  pt pulled tooth 4-5 months ago, right top large hole in tooth   AKI (acute kidney injury) (HCC)    Allergic rhinitis    Anemia    Anxiety    Asthma    Atrial fibrillation (HCC)    BPH (benign prostatic hypertrophy)    Massive BPH noted on cystoscopy 1/23/ 2012 by Dr. Isabel Caprice.   Cancer [facility])    prostate cancer 2019   Cardiomyopathy [facility])    CHF (congestive heart failure) (HCC)    Cough 03/30/2012   Depression    Diabetes mellitus 04/08/2008   type  2   Dyspnea    Dysrhythmia 2019   Foley catheter in place 07-05-17 placed   Fracture, orbital (HCC) 2021   Right   GERD (gastroesophageal reflux disease) 2020   Headache(784.0)    hx migraines none recent   History of esophagitis 12/16/2019   Hyperlipemia    Hypertension    Hypertensive cardiopathy 03/01/2006   2-D echocardiogram 02/01/2012 showed moderate LVH, mildly to moderately reduced left ventricular systolic function with an estimated ejection fraction of 40-45%, and diffuse hypokinesis.  A nuclear medicine stress study  done 01/31/2012 showed no reversible ischemia, a small mid anterior wall fixed defect/infarct, and ejection fraction 42%.       Neck pain    Nephrolithiasis 05/29/2010   CT scan of abdomen/pelvis on 05/29/2010 showed an obstructing approximate 1-2 mm calculus at the left UVJ, and an approximate 1-2 mm left lower pole renal calculus.   Patient had continuing severe pain , and an elevation of his serum creatinine to a value of 1.75 on 06/06/2010.  Patient underwent cystoscopy on 06/08/2010 by Dr. Isabel Caprice, but attempts at retrograde pyelogram and ureteroscopy were unsucc   Numbness 01/08/2018   Obstructive sleep apnea 03/06/2008   Sleep study 03/06/08 showed severe OSA/hypopnea syndrome, with successful CPAP titration to 13 CWP using a medium ResMed Mirage Quattro full face mask with heated humidifier.    Personal history of prostate cancer 10/06/2017   Rash 04/17/2014   Renal calculus 05/29/2010   CT scan of abdomen/pelvis on 05/29/2010 showed an obstructing approximate 1-2 mm calculus at the left UVJ, and an approximate 1-2 mm left lower pole renal calculus.   Patient had continuing severe pain , and an elevation of his serum creatinine to a value of 1.75 on 06/06/2010.  The stone had apparently passed and was not seen on repeat CT 06/08/2010.   Sleep apnea    haven't use cpap in 2 years   Tooth pain 10/29/2017   Urinary straining 11/02/2016    Past Surgical History:  Procedure Laterality Date   big toe nails removed Bilateral 20 yrs ago   BIOPSY  06/23/2022   Procedure: BIOPSY;  Surgeon: Shellia Cleverly, DO;  Location: [facility];  Service: Gastroenterology;;   CARDIOVERSION N/A 06/08/2017   Procedure: CARDIOVERSION;  Surgeon: Wendall Stade, MD;  Location: [facility];  Service: Cardiovascular;  Laterality: N/A;   COLONOSCOPY     CYSTOSCOPY W/ RETROGRADES     ESOPHAGOGASTRODUODENOSCOPY (EGD) WITH PROPOFOL N/A 06/23/2022   Procedure: ESOPHAGOGASTRODUODENOSCOPY (EGD) WITH PROPOFOL;  Surgeon:  Shellia Cleverly, DO;  Location: [facility];  Service: Gastroenterology;  Laterality: N/A;   FRACTURE SURGERY Right 2021   5th right phalange   HEMOSTASIS CLIP PLACEMENT  06/23/2022   Procedure: HEMOSTASIS CLIP PLACEMENT;  Surgeon: Shellia Cleverly, DO;  Location: [facility];  Service: Gastroenterology;;   INCISIONAL HERNIA REPAIR N/A 03/24/2020   Procedure: LAPAROSCOPIC INCISIONAL HERNIA REPAR WITH MESH;  Surgeon: Violeta Gelinas, MD;  Location: [facility] OR;  Service: General;  Laterality: N/A;   IR RADIOLOGIST EVAL & MGMT  02/02/2018   LYMPHADENECTOMY Bilateral 10/06/2017   Procedure: LYMPHADENECTOMY;  Surgeon: Crista Elliot, MD;  Location: [facility];  Service: Urology;  Laterality: Bilateral;   ROBOT ASSISTED LAPAROSCOPIC RADICAL PROSTATECTOMY N/A 10/06/2017   Procedure: XI ROBOTIC ASSISTED LAPAROSCOPIC RADICAL PROSTATECTOMY;  Surgeon: Crista Elliot, MD;  Location: [facility];  Service: Urology;  Laterality: N/A;   TRANSURETHRAL RESECTION OF BLADDER TUMOR N/A 07/13/2017   Procedure: TRANSURETHRAL  RESECTION OF PROSTATE;  Surgeon: Crista Elliot, MD;  Location: [facility];  Service: Urology;  Laterality: N/A;    Outpatient Medications Prior to Visit  Medication Sig Dispense Refill   Accu-Chek Softclix Lancets lancets Check blood sugar three times a day as instructed 300 each 11   amiodarone (PACERONE) 200 MG tablet Take 1 tablet (200 mg total) by mouth every morning. 15 tablet 0   amLODipine (NORVASC) 5 MG tablet TAKE ONE TABLET BY MOUTH EVERY EVENING 30 tablet 6   apixaban (ELIQUIS) 5 MG TABS tablet Take 1 tablet (5 mg total) by mouth 2 (two) times daily. 60 tablet 11   atorvastatin (LIPITOR) 40 MG tablet TAKE ONE TABLET BY MOUTH EVERY EVENING 30 tablet 6   Blood Glucose Monitoring Suppl (ACCU-CHEK GUIDE) w/Device KIT 1 Units by Does not apply route 3 (three) times daily. 1 kit 0   Blood Glucose Monitoring Suppl (BLOOD GLUCOSE MONITOR SYSTEM) w/Device KIT Use up to 4 (four) times daily as  directed 1 kit 0   carvedilol (COREG) 25 MG tablet TAKE ONE TABLET BY MOUTH EVERY MORNING and TAKE ONE TABLET BY MOUTH EVERY EVENING 180 tablet 6   COMFORT EZ PEN NEEDLES 31G X 6 MM MISC USE AS DIRECTED AT BEDTIME 100 each 3   COMFORT EZ PEN NEEDLES 32G X 6 MM MISC      Dexlansoprazole (DEXILANT) 30 MG capsule DR Take 1 capsule (30 mg total) by mouth 2 (two) times daily. 60 capsule 12   DULoxetine (CYMBALTA) 60 MG capsule TAKE ONE CAPSULE BY MOUTH ONCE DAILY 90 capsule 3   ezetimibe (ZETIA) 10 MG tablet TAKE ONE TABLET BY MOUTH EVERY EVENING 90 tablet 2   glucose blood (ACCU-CHEK GUIDE) test strip USE TO check blood glucose THREE TIMES DAILY AS DIRECTED 300 strip 11   insulin glargine (LANTUS SOLOSTAR) 100 UNIT/ML Solostar Pen Inject 30 Units into the skin 2 (two) times daily. 15 mL 11   JARDIANCE 25 MG TABS tablet TAKE ONE TABLET BY MOUTH EVERY MORNING 90 tablet 3   oxybutynin (DITROPAN XL) 15 MG 24 hr tablet Take 1 tablet (15 mg total) by mouth daily. 90 tablet 3   sacubitril-valsartan (ENTRESTO) 97-103 MG TAKE ONE TABLET BY MOUTH EVERY MORNING and TAKE ONE TABLET BY MOUTH EVERY EVENING 180 tablet 6   SITagliptin 50 MG TABS Take 50 mg by mouth daily. 30 tablet 11   spironolactone (ALDACTONE) 50 MG tablet TAKE ONE TABLET BY MOUTH EVERY MORNING 30 tablet 6   torsemide (DEMADEX) 20 MG tablet TAKE TWO TABLETS BY MOUTH EVERY MORNING (Patient taking differently: Take 40 mg by mouth daily.) 180 tablet 2   No facility-administered medications prior to visit.     Allergies:   Lisinopril   Social History   Socioeconomic History   Marital status: Single    Spouse name: Not on file   Number of children: 4   Years of education: 16   Highest education level: Not on file  Occupational History   Occupation:        Employer: UNEMPLOYED  Tobacco Use   Smoking status: Never   Smokeless tobacco: Never  Vaping Use   Vaping status: Never Used  Substance and Sexual Activity   Alcohol use: No     Alcohol/week: 0.0 standard drinks of alcohol   Drug use: No   Sexual activity: Not Currently  Other Topics Concern   Not on file  Social History Narrative   Divorced, 4 children,  lives alone.     Social Determinants of Health   Financial Resource Strain: Low Risk  (10/28/2022)   Received from [facility]   Overall Financial Resource Strain (CARDIA)    Difficulty of Paying Living Expenses: Not hard at all  Food Insecurity: No Food Insecurity (10/28/2022)   Received from [facility], [facility]   Hunger Vital Sign    Worried About Running Out of Food in the Last Year: Never true    Ran Out of Food in the Last Year: Never true  Transportation Needs: No Transportation Needs (10/28/2022)   Received from [facility], [facility]   PRAPARE - Transportation    Lack of Transportation (Medical): No    Lack of Transportation (Non-Medical): No  Physical Activity: Sufficiently Active (08/12/2022)   Exercise Vital Sign    Days of Exercise per Week: 7 days    Minutes of Exercise per Session: 30 min  Stress: No Stress Concern Present (07/15/2022)   [employer] of Occupational Health - Occupational Stress Questionnaire    Feeling of Stress : Only a little  Social Connections: Moderately Integrated (08/12/2022)   Social Connection and Isolation Panel [NHANES]    Frequency of Communication with Friends and Family: More than three times a week    Frequency of Social Gatherings with Friends and Family: More than three times a week    Attends Religious Services: More than 4 times per year    Active Member of [employer] or Organizations: Yes    Attends Engineer, structural: More than 4 times per year    Marital Status: Divorced     Family History:  The patient's family history includes Breast cancer in his mother; Diabetes in his maternal grandmother and mother; Hypertension in his father; Stroke in his father; Ulcerative colitis in his mother.   ROS:   Please see  the history of present illness.    All other systems are reviewed and are negative.   PHYSICAL EXAM:   VS:  BP 110/72 (BP Location: Left Arm, Patient Position: Sitting, Cuff Size: Large)   Pulse 90   Ht 6\' 1"  (1.854 m)   Wt 292 lb (132.5 kg)   SpO2 96%   BMI 38.52 kg/m      General: Alert, oriented x3, no distress, morbidly obese Head: no evidence of trauma, PERRL, EOMI, no exophtalmos or lid lag, no myxedema, no xanthelasma; normal ears, nose and oropharynx Neck: normal jugular venous pulsations and no hepatojugular reflux; brisk carotid pulses without delay and no carotid bruits Chest: clear to auscultation, no signs of consolidation by percussion or palpation, normal fremitus, symmetrical and full respiratory excursions Cardiovascular: normal position and quality of the apical impulse, regular rhythm, normal first and second heart sounds, no murmurs, rubs or gallops Abdomen: no tenderness or distention, no masses by palpation, no abnormal pulsatility or arterial bruits, normal bowel sounds, no hepatosplenomegaly Extremities: 1+ bilateral retromalleolar edema Neurological: grossly nonfocal Psych: Normal mood and affect    Wt Readings from Last 3 Encounters:  04/13/23 292 lb (132.5 kg)  04/01/23 295 lb (133.8 kg)  03/17/23 (!) 305 lb 8 oz (138.6 kg)      Studies/Labs Reviewed:   EKG: Personally reviewed the most recent tracing from 04/01/2023 ER visit showed normal sinus rhythm, regular axis, T wave inversion in the inferior leads. Recent Labs: 06/23/2022: Magnesium 2.3 04/01/2023: ALT QUANTITY NOT SUFFICIENT, UNABLE TO PERFORM TEST; BUN 16; Creatinine, Ser 1.54; Hemoglobin 14.8; Platelets 179; Potassium 4.2;  Sodium 138   Lipid Panel    Component Value Date/Time   CHOL 133 10/12/2022 0951   TRIG 285 (H) 10/12/2022 0951   HDL 23 (L) 10/12/2022 0951   CHOLHDL 5.8 (H) 10/12/2022 0951   CHOLHDL 3.9 01/09/2018 0509   VLDL 11 01/09/2018 0509   LDLCALC 64 10/12/2022 0951      ASSESSMENT:    1. Chronic combined systolic and diastolic CHF (congestive heart failure) (HCC)   2. PAF (paroxysmal atrial fibrillation) (HCC)   3. On amiodarone therapy   4. Acquired thrombophilia (HCC)   5. Nonischemic cardiomyopathy (HCC)   6. Essential hypertension   7. OSA (obstructive sleep apnea)   8. Type 2 diabetes mellitus with peripheral neuropathy (HCC)   9. Stage 3a chronic kidney disease (HCC)   10. Severe obesity (BMI 35.0-39.9) with comorbidity (HCC)   11. Dyslipidemia (high LDL; low HDL)         PLAN:  In order of problems listed above:  CHF: Today he appears to be clinically euvolemic and has NYHA functional class I-2.  We talked at in detail about the purpose of his medications particularly assisting on the fact that abrupt discontinuation of carvedilol and lead to beta-blocker rebound.  We talked about this before.  His dry weight may be a little lower than it was in the past, but I think were okay as long as he keeps his fluid weight under 300 pounds. AFib: No recent clinically apparent episodes.  On chronic amiodarone and reports compliance with Xarelto.  CHADSVasc 3-4 (HTN, CHF, DM, +/- CAD) on anticoagulation. Amiodarone monitoring: Need to recheck his liver and thyroid function tests.  No overt evidence of other side effects. Anticoagulation: No bleeding problems.  Rivaroxaban is dosed based on renal dysfunction. HTN: well controlled CMP:  Suspect mostly due to long-untreated malignant HTN. Etiology may be mixed ischemic/nonischemic, but nonischemic cardiomyopathy is probably the major component.  He has never had coronary angiography, since his creatinine is abnormal, compliance has been poor, he has never had angina pectoris and his nuclear stress test has never shown a reversible defect.  Even though a remote nuclear stress test showed an anterior defect, the echo shows global left ventricular hypokinesis and he does not have any Q waves on ECG.   His  LVEF seems to vary in direct relationship to the success of blood pressure control.   OSA: Encouraged him to be 100% compliant with CPAP. DM: Complicated by retinopathy and nephropathy.  There has been a marked deterioration in glycemic control this year, due to noncompliance with diet and medications.  His most recent hemoglobin A1c was 1>14% on 02/23/2023 CKD 3a: Recheck labs.  Most recent creatinine 1.54 at the time of his ER visit 04/01/2023.  Typical GFR is around 45. Obesity: He has lost a little weight.  Still in severely obese range. HLP: LDL cholesterol is in target range, but other lipid parameters are quite abnormal, at least in part due to poor glycemic control.  The following statement remains true, although there is some improvement in his understanding: He has a complicated medical condition and is on a very complicated list of medications with varying effects his metabolic profile.  It is important for him to be seen in the clinic frequently, especially since he is still has limited understanding of the purpose of some of his medications.  Also important recheck his renal function and electrolytes frequently.  He has shown poor compliance with recommendation for lab draws, dietary  restrictions, weight monitoring.   Medication Adjustments/Labs and Tests Ordered: Current medicines are reviewed at length with the patient today.  Concerns regarding medicines are outlined above.  Medication changes, Labs and Tests ordered today are listed in the Patient Instructions below. Patient Instructions  Medication Instructions:  No changes *If you need a refill on your cardiac medications before your next appointment, please call your pharmacy*   Lab Work: CMP, AIC, TSH- today If you have labs (blood work) drawn today and your tests are completely normal, you will receive your results only by: MyChart Message (if you have MyChart) OR A paper copy in the mail If you have any lab test that is  abnormal or we need to change your treatment, we will call you to review the results.   Follow-Up: At [facility], you and your health needs are our priority.  As part of our continuing mission to provide you with exceptional heart care, we have created designated Provider Care Teams.  These Care Teams include your primary Cardiologist (physician) and Advanced Practice Providers (APPs -  Physician Assistants and Nurse Practitioners) who all work together to provide you with the care you need, when you need it.  We recommend signing up for the patient portal called "MyChart".  Sign up information is provided on this After Visit Summary.  MyChart is used to connect with patients for Virtual Visits (Telemedicine).  Patients are able to view lab/test results, encounter notes, upcoming appointments, etc.  Non-urgent messages can be sent to your provider as well.   To learn more about what you can do with MyChart, go to [email or web address].    Your next appointment:   6 month(s)  Provider:   Thurmon Fair, MD      Signed, Thurmon Fair, MD  04/13/2023 12:18 PM    [facility] [address] [place], [place], [place]  [address] Phone: [phone]; Fax: [phone]

## 2023-04-13 NOTE — Telephone Encounter (Signed)
Called by [facility] about AM lab with glucose >600. EMR review notes that he's had medication non-adherence, A1c very high. Other labs on CMP do not indicate metabolic acidosis.  Tried calling patient twice, unable to reach, unable to leave voice message Got a hold of patient's son who was able to contact the patient.  Per patient, asymptomatic Glucometer broken, so unable to check glucose tonight Confirmed he had taken lantus 62 units earlier this evening. He would like to reach out to his PCP/nutritionist for further guidance.  Instructed him to take his insulin daily, and get his glucometer fixed or replaced ASAP given critical glucose levels. If symptomatic, he should come into the ED for emergency care. If unable to get glucose checked by Friday (day after Thanksgiving), would suggest he go to urgent care for a glucose check.

## 2023-04-13 NOTE — Telephone Encounter (Signed)
Informed that Dr Royann Shivers would like him to be seen by an APP in 3 months. Made appt for 07/14/23 at 0800 with Azalee Course, PA. He agreed with plan of care and is aware of appt.

## 2023-04-14 LAB — COMPREHENSIVE METABOLIC PANEL
ALT: 13 [IU]/L (ref 0–44)
AST: 12 [IU]/L (ref 0–40)
Albumin: 4.2 g/dL (ref 3.9–4.9)
Alkaline Phosphatase: 119 [IU]/L (ref 44–121)
BUN/Creatinine Ratio: 9 — ABNORMAL LOW (ref 10–24)
BUN: 17 mg/dL (ref 8–27)
Bilirubin Total: 0.3 mg/dL (ref 0.0–1.2)
CO2: 26 mmol/L (ref 20–29)
Calcium: 10.1 mg/dL (ref 8.6–10.2)
Chloride: 93 mmol/L — ABNORMAL LOW (ref 96–106)
Creatinine, Ser: 1.79 mg/dL — ABNORMAL HIGH (ref 0.76–1.27)
Globulin, Total: 3.1 g/dL (ref 1.5–4.5)
Glucose: 600 mg/dL (ref 70–99)
Potassium: 4 mmol/L (ref 3.5–5.2)
Sodium: 135 mmol/L (ref 134–144)
Total Protein: 7.3 g/dL (ref 6.0–8.5)
eGFR: 42 mL/min/{1.73_m2} — ABNORMAL LOW (ref >59)

## 2023-04-14 LAB — HEMOGLOBIN A1C
Est. average glucose Bld gHb Est-mCnc: 381 mg/dL
Hgb A1c MFr Bld: 14.9 % — ABNORMAL HIGH (ref 4.8–5.6)

## 2023-04-14 LAB — TSH: TSH: 1.2 u[IU]/mL (ref 0.450–4.500)

## 2023-04-19 ENCOUNTER — Encounter: Payer: 59 | Admitting: Internal Medicine

## 2023-04-21 ENCOUNTER — Other Ambulatory Visit (HOSPITAL_COMMUNITY): Payer: Self-pay

## 2023-04-21 ENCOUNTER — Other Ambulatory Visit: Payer: Self-pay | Admitting: Pharmacist

## 2023-04-21 MED ORDER — ACCU-CHEK GUIDE W/DEVICE KIT
PACK | 0 refills | Status: DC
  Filled 2023-04-21: qty 1, 30d supply, fill #0

## 2023-04-21 MED ORDER — ACCU-CHEK GUIDE TEST VI STRP
ORAL_STRIP | Freq: Four times a day (QID) | 12 refills | Status: DC
  Filled 2023-04-21: qty 100, 25d supply, fill #0

## 2023-04-21 NOTE — Progress Notes (Signed)
   04/21/2023 Name: Terry Parks MRN: [id number] DOB: 01-09-1956  Chief Complaint  Patient presents with   Medication Management    Terry Parks is a 67 y.o. year old male who presented for a telephone visit.   They were referred to the pharmacist by their PCP for assistance in managing complex medication management.   Previously experimented by stopping his medications completely and significant adherence issues noted   Subjective:  Care Team: Primary Care Provider: Olegario Messier, MD ; Next Scheduled Visit: 04/25/23 Clinical Pharmacist: Marlowe Aschoff, PharmD  Medication Access/Adherence  Current Pharmacy:  [facility], [place] - [address] [place] [address] Phone: [phone] Fax: [phone]  [place] - [facility] [address] [place] [address] Phone: [phone] Fax: [phone]   Patient reports affordability concerns with their medications: No  Patient reports access/transportation concerns to their pharmacy: No  Patient reports adherence concerns with their medications:  Yes  - has not received adherence packaging medications since 01/19/23 for 30DS   Medication Management:  Patient reports Poor adherence to medications  Patient reports more recently re-starting his medications from the adherence packaging sent by [facility]. However during phone call today, he still reports having about 1 month left of medications due to having overstock from prior. Will NOT call [facility] for more until he runs out of current supply    Objective:  Lab Results  Component Value Date   HGBA1C 14.9 (H) 04/13/2023    Lab Results  Component Value Date   CREATININE 1.79 (H) 04/13/2023   BUN 17 04/13/2023   NA 135 04/13/2023   K 4.0 04/13/2023   CL 93 (L) 04/13/2023   CO2 26 04/13/2023    Lab Results  Component Value Date   CHOL 133 10/12/2022   HDL 23 (L)  10/12/2022   LDLCALC 64 10/12/2022   TRIG 285 (H) 10/12/2022   CHOLHDL 5.8 (H) 10/12/2022    Medications Reviewed Today   Medications were not reviewed in this encounter       Assessment/Plan:   Medication Management: - Currently strategy insufficient to maintain appropriate adherence to prescribed medication regimen - When I asked about reviewing medications again, he said he "is not in the place to review them right now" as he was getting ready to leave for a funeral today - Said his list was updated based on memory of what he's taking at his last PCP office - This is the 4th attempt at getting an updated medication list from the patient- significant concerns about adherence    Follow Up Plan:  - No follow-up call needed- will set task to review next shipment in February to see what is missing  - Requested refill of insulin- advised there are refills at [facility] on [facility]; declined phone number to call when offered and said he would go get it when needed (still has 2 pens left, but confirms taking 64 units a day right now) - Requested new BG meter as his is "broken"- advised I sent in new Accu-Chek Guide kit and strips to [facility]  Marlowe Aschoff, PharmD [facility] Phone Number: [phone]

## 2023-04-22 ENCOUNTER — Other Ambulatory Visit (HOSPITAL_COMMUNITY): Payer: Self-pay

## 2023-04-25 ENCOUNTER — Ambulatory Visit: Payer: 59 | Admitting: Internal Medicine

## 2023-04-25 ENCOUNTER — Other Ambulatory Visit: Payer: Self-pay

## 2023-04-25 ENCOUNTER — Encounter: Payer: Self-pay | Admitting: Internal Medicine

## 2023-04-25 ENCOUNTER — Encounter: Payer: 59 | Admitting: Internal Medicine

## 2023-04-25 VITALS — BP 120/74 | HR 83 | Temp 97.5°F | Ht 73.0 in | Wt 300.7 lb

## 2023-04-25 DIAGNOSIS — E11319 Type 2 diabetes mellitus with unspecified diabetic retinopathy without macular edema: Secondary | ICD-10-CM | POA: Diagnosis not present

## 2023-04-25 DIAGNOSIS — G4733 Obstructive sleep apnea (adult) (pediatric): Secondary | ICD-10-CM | POA: Diagnosis not present

## 2023-04-25 DIAGNOSIS — Z794 Long term (current) use of insulin: Secondary | ICD-10-CM | POA: Diagnosis not present

## 2023-04-25 DIAGNOSIS — I5042 Chronic combined systolic (congestive) and diastolic (congestive) heart failure: Secondary | ICD-10-CM | POA: Diagnosis not present

## 2023-04-25 DIAGNOSIS — I152 Hypertension secondary to endocrine disorders: Secondary | ICD-10-CM

## 2023-04-25 DIAGNOSIS — E559 Vitamin D deficiency, unspecified: Secondary | ICD-10-CM

## 2023-04-25 DIAGNOSIS — E1159 Type 2 diabetes mellitus with other circulatory complications: Secondary | ICD-10-CM | POA: Diagnosis not present

## 2023-04-25 DIAGNOSIS — Z7984 Long term (current) use of oral hypoglycemic drugs: Secondary | ICD-10-CM

## 2023-04-25 DIAGNOSIS — E1165 Type 2 diabetes mellitus with hyperglycemia: Secondary | ICD-10-CM | POA: Diagnosis not present

## 2023-04-25 DIAGNOSIS — R69 Illness, unspecified: Secondary | ICD-10-CM

## 2023-04-25 DIAGNOSIS — Z8546 Personal history of malignant neoplasm of prostate: Secondary | ICD-10-CM

## 2023-04-25 NOTE — Assessment & Plan Note (Signed)
Stable, compensated; trace pretibial edema, supple skin of calves.  Dr. Sterling Big made no adjustments in prescriptions at recent visit, though there are a number of changes which might make adherence easier (see my last note).

## 2023-04-25 NOTE — Assessment & Plan Note (Signed)
Patient plans to make f/u appt with urologist to address urinary urge and frequency.

## 2023-04-25 NOTE — Assessment & Plan Note (Signed)
[facility] Pharmacist has made contact with Terry Parks, though he was unable to provide her with a list of the medicines he is actually taking.  My understanding is that he has plenty of pill packs from the mail order pharmacy through February; she'll review when those are sent.  There are meds however which may not be in pill pack. [name] remains very frustrated and overwhelmed.  He admits to stress associated with complex medical needs.  He doesn't recall  why cymbalta was prescribed last summer - he hasn't been taking it - I explained it can help with both chronic pain and with anxiety and low mood.  He has some at home and may start it.

## 2023-04-25 NOTE — Progress Notes (Signed)
67 y.o. Terry Parks is here for routine follow-up of poorly controlled multimorbidity and medication non-adherence.  I had scheduled a couple of visits in f/u of resident clinic to spend additional time looking into his obstacles.  At last visit he was very confused about his medications which were coming from multiple pharmacies, one of which used pill pack, which was being used out of sync.  He had stopped taking everything.  Referred to [facility] pharmacy for assistance.  Since last visit patient has seen Dr. Jomarie Longs for overdue  cardiology appt.  He has had a call from [facility] pharmacist who is addressing his meds.  Getting back on track with a routine of getting up in the morning and substituting water for his soda as first drink in the morning; taking his BP and glucose measurements and writing them down, and recording his weight.  He is feeling some better. 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  Still isn't fully adherent "I need to get back on track".  Notes that he has been sleepy can fall asleep easily during the day; hasn't worn a OSA mask for many years; needs some teeth pulled before he can be refit for mask, though he is willing.  He does have a dentist who he trusts, just needs to make an appt.    Patient Active Problem List   Diagnosis Date Noted   Multiple chronic diseases 03/21/2023   Psychosocial problem 08/31/2022   Chronic anticoagulation 06/23/2022   History of class 4 esophagitis with hemorrhage 06/23/2022   Recurrent abdominal hernia without obstruction or gangrene 05/22/2021   Class 3 obesity 11/10/2020   S/P TURP 11/10/2020   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   Depression 01/31/2019   Iron deficiency anemia 04/28/2018   CKD stage 3 due to type 2 diabetes mellitus (HCC) 02/11/2018   Healthcare maintenance 02/11/2018   Personal history of prostate cancer 10/06/2017   Longstanding persistent atrial fibrillation (HCC)    Vitamin D deficiency 06/22/2016    Allergic rhinitis 10/19/2012   Diabetic retinopathy (HCC) 10/04/2012   Type 2 diabetes mellitus with peripheral neuropathy (HCC) 04/08/2008   Obstructive sleep apnea    Hyperlipidemia associated with type 2 diabetes mellitus (HCC)    Hypertension associated with diabetes (HCC)    Chronic combined systolic and diastolic CHF (congestive heart failure) (HCC) 03/01/2006    Current Outpatient Medications:    Accu-Chek Softclix Lancets lancets, Check blood sugar three times a day as instructed, Disp: 300 each, Rfl: 11   amiodarone (PACERONE) 200 MG tablet, Take 1 tablet (200 mg total) by mouth every morning., Disp: 15 tablet, Rfl: 0   amLODipine (NORVASC) 5 MG tablet, TAKE ONE TABLET BY MOUTH EVERY EVENING, Disp: 30 tablet, Rfl: 6   apixaban (ELIQUIS) 5 MG TABS tablet, Take 1 tablet (5 mg total) by mouth 2 (two) times daily., Disp: 60 tablet, Rfl: 11   atorvastatin (LIPITOR) 40 MG tablet, TAKE ONE TABLET BY MOUTH EVERY EVENING, Disp: 30 tablet, Rfl: 6   Blood Glucose Monitoring Suppl (ACCU-CHEK GUIDE) w/Device KIT, Use as directed to check blood sugar., Disp: 1 kit, Rfl: 0   carvedilol (COREG) 25 MG tablet, TAKE ONE TABLET BY MOUTH EVERY MORNING and TAKE ONE TABLET BY MOUTH EVERY EVENING, Disp: 180 tablet, Rfl: 6   COMFORT EZ PEN NEEDLES 31G X 6 MM MISC, USE AS DIRECTED AT BEDTIME, Disp: 100 each, Rfl: 3   COMFORT EZ PEN NEEDLES 32G X 6 MM MISC, , Disp: ,  Rfl:    Dexlansoprazole (DEXILANT) 30 MG capsule DR, Take 1 capsule (30 mg total) by mouth 2 (two) times daily. (Patient not taking: Reported on 04/21/2023), Disp: 60 capsule, Rfl: 12   DULoxetine (CYMBALTA) 60 MG capsule, TAKE ONE CAPSULE BY MOUTH ONCE DAILY, Disp: 90 capsule, Rfl: 3   ezetimibe (ZETIA) 10 MG tablet, TAKE ONE TABLET BY MOUTH EVERY EVENING, Disp: 90 tablet, Rfl: 2   glucose blood (ACCU-CHEK GUIDE TEST) test strip, Use to check blood sugar up to 4 (four) times daily., Disp: 100 each, Rfl: 12   insulin glargine (LANTUS SOLOSTAR) 100  UNIT/ML Solostar Pen, Inject 30 Units into the skin 2 (two) times daily., Disp: 15 mL, Rfl: 11   JARDIANCE 25 MG TABS tablet, TAKE ONE TABLET BY MOUTH EVERY MORNING, Disp: 90 tablet, Rfl: 3   oxybutynin (DITROPAN XL) 15 MG 24 hr tablet, Take 1 tablet (15 mg total) by mouth daily., Disp: 90 tablet, Rfl: 3   sacubitril-valsartan (ENTRESTO) 97-103 MG, TAKE ONE TABLET BY MOUTH EVERY MORNING and TAKE ONE TABLET BY MOUTH EVERY EVENING, Disp: 180 tablet, Rfl: 6   SITagliptin 50 MG TABS, Take 50 mg by mouth daily., Disp: 30 tablet, Rfl: 11   spironolactone (ALDACTONE) 50 MG tablet, TAKE ONE TABLET BY MOUTH EVERY MORNING, Disp: 30 tablet, Rfl: 6   torsemide (DEMADEX) 20 MG tablet, TAKE TWO TABLETS BY MOUTH EVERY MORNING (Patient taking differently: Take 40 mg by mouth daily.), Disp: 180 tablet, Rfl: 2  Functional Status: Independent; attending a degree program.  Objective BP 120/74 (BP Location: Right Arm, Patient Position: Sitting, Cuff Size: Small)   Pulse 83   Temp (!) 97.5 F (36.4 C) (Oral)   Ht 6\' 1"  (1.854 m)   Wt (!) 300 lb 11.2 oz (136.4 kg)   SpO2 100%   BMI 39.67 kg/m  Mood stable today, laughs easily.  No breathlessness.  Legs with trace pretibial edema, supple calf.  Skin is generally mildly tender to squeeze bilaterally below knees.  Chronic venous stasis changes without ulceration noted.   Assessment and Plan: There are no diagnoses linked to this encounter.   No follow-ups on file.

## 2023-04-25 NOTE — Assessment & Plan Note (Addendum)
Due for eye exam [facility] - he will call Too soon to recheck A1c.  Pharmacist has kindly provided insulin refills and a glucometer replacement.

## 2023-04-25 NOTE — Assessment & Plan Note (Signed)
OSA is untreated; known to be severe in the past.  He is experiencing daytime sleepiness; falls asleep easily and is always fatigued.  He has not worn a mask for many years.  Needs to have some teeth pulled before repeat sleep study and mask fitting.  He will reach out to his dentist.

## 2023-04-25 NOTE — Assessment & Plan Note (Signed)
BP well controlled "I took my meds" though he is unable to specify which ones.

## 2023-04-25 NOTE — Assessment & Plan Note (Signed)
Overdue for ophthalmology visit; he'll make appt at [facility].

## 2023-04-25 NOTE — Assessment & Plan Note (Signed)
Vit D level today.  He will most likely need high dose replacement and will need considerable instruction and reminder of purpose and dosing interval once prescription sent.

## 2023-04-26 LAB — VITAMIN D 25 HYDROXY (VIT D DEFICIENCY, FRACTURES): Vit D, 25-Hydroxy: 7.2 ng/mL — ABNORMAL LOW (ref 30.0–100.0)

## 2023-05-04 ENCOUNTER — Ambulatory Visit: Payer: Self-pay

## 2023-05-04 NOTE — Patient Instructions (Signed)
Visit Information  Thank you for taking time to visit with me today. Please don't hesitate to contact me if I can be of assistance to you.   Following are the goals we discussed today:   Goals Addressed               This Visit's Progress     Diabetes, HTN,  CHF, Patient stated goal- to improve my health (pt-stated)        Patient Goals/Self Care Activities: -Patient/Caregiver will take medications as prescribed   -Patient/Caregiver will attend all scheduled provider appointments -Patient/Caregiver will call pharmacy for medication refills 3-7 days in advance of running out of medications -Patient/Caregiver will call provider office for new concerns or questions  -Patient/Caregiver will focus on medication adherence by taking medications as prescribed   Provided education on low sodium diet Provided education about placing scale on hard, flat surface Advised patient to weigh each morning after emptying bladder Discussed importance of daily weight and advised patient to weigh and record daily Reviewed role of diuretics in prevention of fluid overload and management of heart failure; Discussed the importance of keeping all appointments with provider Provided patient with education about the role of exercise in the management of heart failure Wt Readings from Last 3 Encounters:  04/25/23 (!) 300 lb 11.2 oz (136.4 kg)  04/13/23 292 lb (132.5 kg)  04/01/23 295 lb (133.8 kg)  Reviewed medications with patient and discussed importance of medication adherence Counseled on importance of regular laboratory monitoring as prescribed Advised patient, providing education and rationale, to check cbg  and record, calling for findings outside established parameters Review of patient status, including review of consultants reports, relevant laboratory and other test results, and medications completed Lab Results  Component Value Date   HGBA1C 14.9 (H) 04/13/2023   Last practice recorded BP  readings:  BP Readings from Last 3 Encounters:  04/25/23 120/74  04/13/23 110/72  04/01/23 111/68   Most recent eGFR/CrCl:  Lab Results  Component Value Date   EGFR 42 (L) 04/13/2023    No components found for: "CRCL" Provided education to patient re: stroke prevention, s/s of heart attack and stroke Provided assistance with obtaining home blood pressure monitor via [employer], retail; Advised patient, providing education and rationale, to monitor blood pressure daily and record, calling PCP for findings outside established parameters Discussed complications of poorly controlled blood pressure such as heart disease, stroke, circulatory complications, vision complications, kidney impairment, sexual dysfunction Make sure to obtain your BP monitor and glucometer to check and write your values         Our next appointment is by telephone on 05/20/23 at 1030 am  Please call the care guide team at [phone] if you need to cancel or reschedule your appointment.   If you are experiencing a Mental Health or Behavioral Health Crisis or need someone to talk to, please call 1-800-273-TALK (toll free, 24 hour hotline)  Patient verbalizes understanding of instructions and care plan provided today and agrees to view in MyChart. Active MyChart status and patient understanding of how to access instructions and care plan via MyChart confirmed with patient.     Juanell Fairly RN, BSN, [facility]  [facility], [facility]  Care Coordinator Phone: [phone]

## 2023-05-04 NOTE — Patient Outreach (Signed)
Care Coordination   Follow Up Visit Note   05/04/2023 Name: Terry Parks MRN: [id number] DOB: 1955-07-08  Terry Parks is a 67 y.o. year old male who sees Nooruddin, Jason Fila, MD for primary care. I spoke with  [name] by phone today.  What matters to the patients health and wellness today?  Mr. Sellars reported that he has not adhered to his prescribed medication regimen, which has resulted in an increase in his A1C level from 14.0 to 14.9. Despite managing relatively well, his blood pressure measurement was noted at 120/70. During our conversation, Mr. Caraway was participating in a Zoom class, which limited the duration of our discussion. He indicated that several barriers to effective management stem from his inability to take his medication while out, as well as the effects of his fluid medications, which disrupt his sleep patterns by causing him to remain awake at night and rest during the day. Nonetheless, he expressed a recognition of the necessity to reestablish his medication routine. He conveyed his intention to make a concerted effort to address this issue, particularly since his medications are organized in a pill pack. We reviewed the importance of medication adherence and the need to work toward reducing his A1C levels.     Goals Addressed               This Visit's Progress     Diabetes, HTN,  CHF, Patient stated goal- to improve my health (pt-stated)        Patient Goals/Self Care Activities: -Patient/Caregiver will take medications as prescribed   -Patient/Caregiver will attend all scheduled provider appointments -Patient/Caregiver will call pharmacy for medication refills 3-7 days in advance of running out of medications -Patient/Caregiver will call provider office for new concerns or questions  -Patient/Caregiver will focus on medication adherence by taking medications as prescribed   Provided education on low sodium diet Provided education about  placing scale on hard, flat surface Advised patient to weigh each morning after emptying bladder Discussed importance of daily weight and advised patient to weigh and record daily Reviewed role of diuretics in prevention of fluid overload and management of heart failure; Discussed the importance of keeping all appointments with provider Provided patient with education about the role of exercise in the management of heart failure Wt Readings from Last 3 Encounters:  04/25/23 (!) 300 lb 11.2 oz (136.4 kg)  04/13/23 292 lb (132.5 kg)  04/01/23 295 lb (133.8 kg)  Reviewed medications with patient and discussed importance of medication adherence Counseled on importance of regular laboratory monitoring as prescribed Advised patient, providing education and rationale, to check cbg  and record, calling for findings outside established parameters Review of patient status, including review of consultants reports, relevant laboratory and other test results, and medications completed Lab Results  Component Value Date   HGBA1C 14.9 (H) 04/13/2023   Last practice recorded BP readings:  BP Readings from Last 3 Encounters:  04/25/23 120/74  04/13/23 110/72  04/01/23 111/68   Most recent eGFR/CrCl:  Lab Results  Component Value Date   EGFR 42 (L) 04/13/2023    No components found for: "CRCL" Provided education to patient re: stroke prevention, s/s of heart attack and stroke Provided assistance with obtaining home blood pressure monitor via [employer], retail; Advised patient, providing education and rationale, to monitor blood pressure daily and record, calling PCP for findings outside established parameters Discussed complications of poorly controlled blood pressure such as heart disease, stroke, circulatory complications, vision complications, kidney impairment, sexual dysfunction  Make sure to obtain your BP monitor and glucometer to check and write your values         SDOH assessments and  interventions completed:  No     Care Coordination Interventions:  Yes, provided   Interventions Today    Flowsheet Row Most Recent Value  Chronic Disease   Chronic disease during today's visit Congestive Heart Failure (CHF), Diabetes, Hypertension (HTN)  General Interventions   General Interventions Discussed/Reviewed General Interventions Discussed, Labs, Doctor Visits  Labs Hgb A1c every 3 months  Doctor Visits Discussed/Reviewed Doctor Visits Discussed, Doctor Visits Reviewed  Nutrition Interventions   Nutrition Discussed/Reviewed Nutrition Discussed  Pharmacy Interventions   Pharmacy Dicussed/Reviewed Pharmacy Topics Discussed, Medication Adherence  Medication Adherence Not taking medication  Safety Interventions   Safety Discussed/Reviewed Safety Discussed        Follow up plan: Follow up call scheduled for 05/20/23  1030 am    Encounter Outcome:  Patient Visit Completed   Juanell Fairly RN, BSN, [facility] [place]  [facility], [facility]  Care Coordinator Phone: [phone]

## 2023-05-20 ENCOUNTER — Ambulatory Visit: Payer: Self-pay

## 2023-05-20 NOTE — Patient Instructions (Signed)
 Visit Information  Thank you for taking time to visit with me today. Please don't hesitate to contact me if I can be of assistance to you.   Following are the goals we discussed today:   Goals Addressed               This Visit's Progress     Diabetes, HTN,  CHF, Patient stated goal- to improve my health (pt-stated)        Patient Goals/Self Care Activities: -Patient/Caregiver will take medications as prescribed   -Patient/Caregiver will attend all scheduled provider appointments -Patient/Caregiver will call pharmacy for medication refills 3-7 days in advance of running out of medications -Patient/Caregiver will call provider office for new concerns or questions  -Patient/Caregiver will focus on medication adherence by taking medications as prescribed   Provided education on low sodium diet Provided education about placing scale on hard, flat surface Advised patient to weigh each morning after emptying bladder Discussed importance of daily weight and advised patient to weigh and record daily Reviewed role of diuretics in prevention of fluid overload and management of heart failure; Discussed the importance of keeping all appointments with provider Provided patient with education about the role of exercise in the management of heart failure Wt Readings from Last 3 Encounters:  04/25/23 (!) 300 lb 11.2 oz (136.4 kg)  04/13/23 292 lb (132.5 kg)  04/01/23 295 lb (133.8 kg)  Reviewed medications with patient and discussed importance of medication adherence Counseled on importance of regular laboratory monitoring as prescribed Advised patient, providing education and rationale, to check cbg  and record, calling for findings outside established parameters Review of patient status, including review of consultants reports, relevant laboratory and other test results, and medications completed Lab Results  Component Value Date   HGBA1C 14.9 (H) 04/13/2023   Last practice recorded BP  readings:  BP Readings from Last 3 Encounters:  04/25/23 120/74  04/13/23 110/72  04/01/23 111/68   Most recent eGFR/CrCl:  Lab Results  Component Value Date   EGFR 42 (L) 04/13/2023    No components found for: CRCL Provided education to patient re: stroke prevention, s/s of heart attack and stroke Provided assistance with obtaining home blood pressure monitor via [employer], retail; Advised patient, providing education and rationale, to monitor blood pressure daily and record, calling PCP for findings outside established parameters Discussed complications of poorly controlled blood pressure such as heart disease, stroke, circulatory complications, vision complications, kidney impairment, sexual dysfunction Make sure to obtain your BP monitor and glucometer to check and write your values Discuss the complications of not taking his medications Advised using an alarm to remind him of taking his medications         Our next appointment is by telephone on 06/24/23 at 11 am  Please call the care guide team at [phone] if you need to cancel or reschedule your appointment.   If you are experiencing a Mental Health or Behavioral Health Crisis or need someone to talk to, please call 1-800-273-TALK (toll free, 24 hour hotline)  Patient verbalizes understanding of instructions and care plan provided today and agrees to view in MyChart. Active MyChart status and patient understanding of how to access instructions and care plan via MyChart confirmed with patient.     Wilbert Diver RN, BSN, [facility]  [facility], [facility]  Care Coordinator Phone: [phone]

## 2023-05-20 NOTE — Patient Outreach (Signed)
 Care Coordination   Follow Up Visit Note   05/20/2023 Name: Terry Parks MRN: [id number] DOB: [birth year]  Terry Parks is a 68 y.o. year old male who sees Nooruddin, Saad, MD for primary care. I spoke with  [name] by phone today.  What matters to the patients health and wellness today?  I conducted a discussion with [name], who expressed ongoing difficulties in adhering to his medication regimen, despite utilizing pill packs. He typically takes the first pill pack, places it in his pocket, and subsequently forgets to administer it throughout the day. [name] acknowledged that he is consuming foods and beverages that are not appropriate for his condition, resulting in his blood sugar levels fluctuating between 143 and 349. He reported that his blood pressure remains stable, averaging around 130/98, while his weight is recorded at 280 lbs.  I informed him of the potential health implications arising from not taking his  medication as recommended that he set an alarm to serve as a reminder. However, he indicated that this approach has not proven effective for him. I emphasized the critical importance of adhering to his prescribed medication regimen to mitigate health risks.    Goals Addressed               This Visit's Progress     Diabetes, HTN,  CHF, Patient stated goal- to improve my health (pt-stated)        Patient Goals/Self Care Activities: -Patient/Caregiver will take medications as prescribed   -Patient/Caregiver will attend all scheduled provider appointments -Patient/Caregiver will call pharmacy for medication refills 3-7 days in advance of running out of medications -Patient/Caregiver will call provider office for new concerns or questions  -Patient/Caregiver will focus on medication adherence by taking medications as prescribed   Provided education on low sodium diet Provided education about placing scale on hard, flat surface Advised patient to  weigh each morning after emptying bladder Discussed importance of daily weight and advised patient to weigh and record daily Reviewed role of diuretics in prevention of fluid overload and management of heart failure; Discussed the importance of keeping all appointments with provider Provided patient with education about the role of exercise in the management of heart failure Wt Readings from Last 3 Encounters:  04/25/23 (!) 300 lb 11.2 oz (136.4 kg)  04/13/23 292 lb (132.5 kg)  04/01/23 295 lb (133.8 kg)  Reviewed medications with patient and discussed importance of medication adherence Counseled on importance of regular laboratory monitoring as prescribed Advised patient, providing education and rationale, to check cbg  and record, calling for findings outside established parameters Review of patient status, including review of consultants reports, relevant laboratory and other test results, and medications completed Lab Results  Component Value Date   HGBA1C 14.9 (H) 04/13/2023   Last practice recorded BP readings:  BP Readings from Last 3 Encounters:  04/25/23 120/74  04/13/23 110/72  04/01/23 111/68   Most recent eGFR/CrCl:  Lab Results  Component Value Date   EGFR 42 (L) 04/13/2023    No components found for: CRCL Provided education to patient re: stroke prevention, s/s of heart attack and stroke Provided assistance with obtaining home blood pressure monitor via buyer, retail; Advised patient, providing education and rationale, to monitor blood pressure daily and record, calling PCP for findings outside established parameters Discussed complications of poorly controlled blood pressure such as heart disease, stroke, circulatory complications, vision complications, kidney impairment, sexual dysfunction Make sure to obtain your BP monitor and glucometer to check  and write your values Discuss the complications of not taking his medications Advised using an alarm to remind him  of taking his medications         SDOH assessments and interventions completed:  No     Care Coordination Interventions:  Yes, provided   Interventions Today    Flowsheet Row Most Recent Value  Chronic Disease   Chronic disease during today's visit Diabetes, Congestive Heart Failure (CHF), Hypertension (HTN)  General Interventions   General Interventions Discussed/Reviewed General Interventions Discussed, General Interventions Reviewed  Labs Hgb A1c every 3 months  Education Interventions   Education Provided Provided Education  Nutrition Interventions   Nutrition Discussed/Reviewed Nutrition Discussed, Fluid intake, Carbohydrate meal planning, Decreasing sugar intake  Pharmacy Interventions   Pharmacy Dicussed/Reviewed Pharmacy Topics Discussed, Medication Adherence  Medication Adherence Not taking medication  Safety Interventions   Safety Discussed/Reviewed Safety Discussed        Follow up plan: Follow up call scheduled for 06/24/23  11 am    Encounter Outcome:  Patient Visit Completed   Wilbert Diver RN, BSN, [facility]  [facility], [facility]  Care Coordinator Phone: [phone]

## 2023-06-20 ENCOUNTER — Other Ambulatory Visit (HOSPITAL_COMMUNITY): Payer: Self-pay

## 2023-06-29 ENCOUNTER — Other Ambulatory Visit (HOSPITAL_COMMUNITY): Payer: Self-pay

## 2023-07-01 ENCOUNTER — Telehealth: Payer: Self-pay | Admitting: Pharmacist

## 2023-07-01 ENCOUNTER — Other Ambulatory Visit: Payer: Self-pay | Admitting: Pharmacist

## 2023-07-01 NOTE — Progress Notes (Signed)
   07/01/2023  Patient ID: Terry Parks, male   DOB: 02/17/1956, 68 y.o.   MRN: [id number]  Called patient to discuss medication adherence concerns today. Unable to reach at this time. Left HIPAA compliant voicemail requesting call back at earliest convenience. No further concerns at this time. Will try again in 1-2 weeks.   Marlowe Aschoff, PharmD [facility] Phone Number: [phone]

## 2023-07-07 ENCOUNTER — Telehealth: Payer: Self-pay | Admitting: Internal Medicine

## 2023-07-07 ENCOUNTER — Other Ambulatory Visit (HOSPITAL_COMMUNITY): Payer: Self-pay

## 2023-07-07 DIAGNOSIS — I4819 Other persistent atrial fibrillation: Secondary | ICD-10-CM

## 2023-07-07 DIAGNOSIS — E1142 Type 2 diabetes mellitus with diabetic polyneuropathy: Secondary | ICD-10-CM

## 2023-07-07 DIAGNOSIS — I5042 Chronic combined systolic (congestive) and diastolic (congestive) heart failure: Secondary | ICD-10-CM

## 2023-07-07 MED ORDER — AMLODIPINE BESYLATE 5 MG PO TABS
5.0000 mg | ORAL_TABLET | Freq: Every evening | ORAL | 6 refills | Status: DC
  Filled 2023-07-07 – 2023-07-15 (×3): qty 30, 30d supply, fill #0
  Filled 2023-07-21 – 2023-08-08 (×3): qty 30, 30d supply, fill #1
  Filled 2023-08-31 – 2023-09-21 (×3): qty 30, 30d supply, fill #2
  Filled 2023-10-07 – 2023-11-08 (×3): qty 30, 30d supply, fill #3
  Filled 2023-12-01: qty 30, 30d supply, fill #4
  Filled 2023-12-26 – 2023-12-27 (×2): qty 30, 30d supply, fill #5
  Filled 2024-01-31: qty 30, 30d supply, fill #6

## 2023-07-07 MED ORDER — LANTUS SOLOSTAR 100 UNIT/ML ~~LOC~~ SOPN
60.0000 [IU] | PEN_INJECTOR | Freq: Every day | SUBCUTANEOUS | 11 refills | Status: DC
  Filled 2023-07-07: qty 15, 25d supply, fill #0

## 2023-07-07 MED ORDER — OXYBUTYNIN CHLORIDE ER 15 MG PO TB24
15.0000 mg | ORAL_TABLET | Freq: Every day | ORAL | 3 refills | Status: DC
  Filled 2023-07-07: qty 90, 90d supply, fill #0
  Filled 2023-07-15 (×2): qty 30, 30d supply, fill #0
  Filled 2023-07-21 – 2023-08-08 (×3): qty 30, 30d supply, fill #1
  Filled 2023-08-31 – 2023-11-08 (×3): qty 30, 30d supply, fill #2
  Filled 2023-12-01: qty 30, 30d supply, fill #3

## 2023-07-07 MED ORDER — EMPAGLIFLOZIN 25 MG PO TABS
25.0000 mg | ORAL_TABLET | Freq: Every morning | ORAL | 3 refills | Status: DC
  Filled 2023-07-07: qty 90, 90d supply, fill #0
  Filled 2023-07-15 (×2): qty 30, 30d supply, fill #0
  Filled 2023-07-21 – 2023-08-08 (×3): qty 30, 30d supply, fill #1
  Filled 2023-08-31 – 2023-09-21 (×3): qty 30, 30d supply, fill #2
  Filled 2023-10-07 – 2023-11-08 (×3): qty 30, 30d supply, fill #3
  Filled 2023-12-01: qty 30, 30d supply, fill #4
  Filled 2023-12-26 – 2023-12-27 (×2): qty 30, 30d supply, fill #5
  Filled 2024-01-31: qty 30, 30d supply, fill #6
  Filled 2024-02-29: qty 30, 30d supply, fill #7

## 2023-07-07 MED ORDER — SACUBITRIL-VALSARTAN 97-103 MG PO TABS
1.0000 | ORAL_TABLET | Freq: Two times a day (BID) | ORAL | 6 refills | Status: DC
  Filled 2023-07-07: qty 180, 90d supply, fill #0
  Filled 2023-07-15 (×2): qty 60, 30d supply, fill #0
  Filled 2023-07-21 – 2023-08-08 (×3): qty 60, 30d supply, fill #1
  Filled 2023-08-31 – 2023-09-21 (×3): qty 60, 30d supply, fill #2
  Filled 2023-10-07 – 2023-11-08 (×3): qty 60, 30d supply, fill #3
  Filled 2023-12-01: qty 60, 30d supply, fill #4
  Filled 2023-12-26 – 2023-12-27 (×2): qty 60, 30d supply, fill #5
  Filled 2024-01-31: qty 60, 30d supply, fill #6
  Filled 2024-02-29: qty 60, 30d supply, fill #7

## 2023-07-07 MED ORDER — EZETIMIBE 10 MG PO TABS
10.0000 mg | ORAL_TABLET | Freq: Every evening | ORAL | 2 refills | Status: DC
  Filled 2023-07-07: qty 90, 90d supply, fill #0
  Filled 2023-07-15 (×2): qty 30, 30d supply, fill #0
  Filled 2023-07-21 – 2023-08-08 (×3): qty 30, 30d supply, fill #1
  Filled 2023-08-31 – 2023-09-21 (×3): qty 30, 30d supply, fill #2
  Filled 2023-10-07 – 2023-11-08 (×3): qty 30, 30d supply, fill #3
  Filled 2023-12-01: qty 30, 30d supply, fill #4
  Filled 2023-12-26 – 2023-12-27 (×2): qty 30, 30d supply, fill #5
  Filled 2024-01-31: qty 30, 30d supply, fill #6
  Filled 2024-02-29: qty 30, 30d supply, fill #7

## 2023-07-07 MED ORDER — AMIODARONE HCL 200 MG PO TABS
200.0000 mg | ORAL_TABLET | Freq: Every morning | ORAL | 0 refills | Status: DC
  Filled 2023-07-07: qty 15, 15d supply, fill #0

## 2023-07-07 MED ORDER — TORSEMIDE 20 MG PO TABS
40.0000 mg | ORAL_TABLET | Freq: Every day | ORAL | 2 refills | Status: DC
  Filled 2023-07-07: qty 30, 15d supply, fill #0
  Filled 2023-07-15 (×2): qty 60, 30d supply, fill #0

## 2023-07-07 MED ORDER — DULOXETINE HCL 60 MG PO CPEP
60.0000 mg | ORAL_CAPSULE | Freq: Every day | ORAL | 3 refills | Status: DC
  Filled 2023-07-07: qty 90, 90d supply, fill #0
  Filled 2023-07-15 (×2): qty 30, 30d supply, fill #0
  Filled 2023-07-21 – 2023-08-08 (×3): qty 30, 30d supply, fill #1
  Filled 2023-08-31 – 2023-09-21 (×3): qty 30, 30d supply, fill #2
  Filled 2023-10-07 – 2023-11-08 (×3): qty 30, 30d supply, fill #3
  Filled 2023-12-01: qty 30, 30d supply, fill #4
  Filled 2023-12-26 – 2023-12-27 (×2): qty 30, 30d supply, fill #5
  Filled 2024-01-31: qty 30, 30d supply, fill #6
  Filled 2024-02-29: qty 30, 30d supply, fill #7

## 2023-07-07 MED ORDER — APIXABAN 5 MG PO TABS
5.0000 mg | ORAL_TABLET | Freq: Two times a day (BID) | ORAL | 11 refills | Status: DC
  Filled 2023-07-07 – 2023-07-15 (×3): qty 60, 30d supply, fill #0
  Filled 2023-07-21 – 2023-08-08 (×3): qty 60, 30d supply, fill #1
  Filled 2023-08-31 – 2023-09-21 (×3): qty 60, 30d supply, fill #2
  Filled 2023-10-07 – 2023-11-08 (×3): qty 60, 30d supply, fill #3
  Filled 2023-12-01: qty 60, 30d supply, fill #4
  Filled 2023-12-26 – 2023-12-27 (×2): qty 60, 30d supply, fill #5
  Filled 2024-01-31: qty 60, 30d supply, fill #6
  Filled 2024-02-29: qty 60, 30d supply, fill #7

## 2023-07-07 MED ORDER — ATORVASTATIN CALCIUM 40 MG PO TABS
40.0000 mg | ORAL_TABLET | Freq: Every evening | ORAL | 6 refills | Status: DC
  Filled 2023-07-07 – 2023-07-15 (×3): qty 30, 30d supply, fill #0
  Filled 2023-07-21 – 2023-08-08 (×3): qty 30, 30d supply, fill #1
  Filled 2023-08-31 – 2023-09-21 (×3): qty 30, 30d supply, fill #2
  Filled 2023-10-07 – 2023-11-08 (×3): qty 30, 30d supply, fill #3
  Filled 2023-12-01: qty 30, 30d supply, fill #4
  Filled 2023-12-26 – 2023-12-27 (×2): qty 30, 30d supply, fill #5
  Filled 2024-01-31: qty 30, 30d supply, fill #6

## 2023-07-07 MED ORDER — SPIRONOLACTONE 50 MG PO TABS
50.0000 mg | ORAL_TABLET | Freq: Every morning | ORAL | 6 refills | Status: DC
  Filled 2023-07-07 – 2023-07-15 (×2): qty 30, 30d supply, fill #0

## 2023-07-07 MED ORDER — SITAGLIPTIN 50 MG PO TABS
50.0000 mg | ORAL_TABLET | Freq: Every day | ORAL | 11 refills | Status: DC
  Filled 2023-07-07 – 2023-07-15 (×3): qty 30, 30d supply, fill #0
  Filled 2023-07-21 – 2023-08-08 (×3): qty 30, 30d supply, fill #1

## 2023-07-07 MED ORDER — CARVEDILOL 25 MG PO TABS
25.0000 mg | ORAL_TABLET | Freq: Two times a day (BID) | ORAL | 6 refills | Status: DC
  Filled 2023-07-07: qty 180, 90d supply, fill #0
  Filled 2023-07-15 (×2): qty 60, 30d supply, fill #0
  Filled 2023-07-21 – 2023-08-08 (×3): qty 60, 30d supply, fill #1
  Filled 2023-08-31 – 2023-09-21 (×3): qty 60, 30d supply, fill #2
  Filled 2023-10-07 – 2023-11-08 (×3): qty 60, 30d supply, fill #3
  Filled 2023-12-01: qty 60, 30d supply, fill #4
  Filled 2023-12-26 – 2023-12-27 (×2): qty 60, 30d supply, fill #5
  Filled 2024-01-31: qty 60, 30d supply, fill #6
  Filled 2024-02-29: qty 60, 30d supply, fill #7

## 2023-07-07 NOTE — Telephone Encounter (Signed)
Received page from operator requesting return call to patient.  I spoke with Terry Parks who is requesting his medications be sent to [facility]. I verified his medication list with him and sent refills so that he can have them packaged.   Champ Mungo, DO

## 2023-07-08 ENCOUNTER — Other Ambulatory Visit (HOSPITAL_COMMUNITY): Payer: Self-pay

## 2023-07-08 ENCOUNTER — Other Ambulatory Visit: Payer: Self-pay

## 2023-07-11 ENCOUNTER — Other Ambulatory Visit (HOSPITAL_COMMUNITY): Payer: Self-pay

## 2023-07-12 ENCOUNTER — Other Ambulatory Visit (HOSPITAL_BASED_OUTPATIENT_CLINIC_OR_DEPARTMENT_OTHER): Payer: Self-pay

## 2023-07-12 ENCOUNTER — Other Ambulatory Visit (HOSPITAL_COMMUNITY): Payer: Self-pay

## 2023-07-12 MED ORDER — MIRABEGRON ER 25 MG PO TB24
50.0000 mg | ORAL_TABLET | Freq: Every day | ORAL | 3 refills | Status: DC
  Filled 2023-07-12: qty 90, 45d supply, fill #0
  Filled 2023-07-15 (×2): qty 60, 30d supply, fill #0
  Filled 2023-07-21 – 2023-08-08 (×3): qty 60, 30d supply, fill #1
  Filled 2023-08-31 – 2023-09-21 (×5): qty 60, 30d supply, fill #2
  Filled 2023-10-07 – 2023-11-08 (×3): qty 60, 30d supply, fill #3
  Filled 2023-12-01: qty 60, 30d supply, fill #4
  Filled 2023-12-26 – 2023-12-27 (×2): qty 60, 30d supply, fill #5

## 2023-07-12 MED ORDER — CEPHALEXIN 500 MG PO CAPS
500.0000 mg | ORAL_CAPSULE | Freq: Two times a day (BID) | ORAL | 0 refills | Status: DC
  Filled 2023-07-12: qty 6, 3d supply, fill #0

## 2023-07-12 MED ORDER — OXYBUTYNIN CHLORIDE ER 15 MG PO TB24
15.0000 mg | ORAL_TABLET | Freq: Every day | ORAL | 3 refills | Status: DC
  Filled 2023-07-12 (×2): qty 90, 90d supply, fill #0
  Filled 2023-09-21: qty 30, 30d supply, fill #0
  Filled 2023-10-07 – 2023-12-01 (×4): qty 30, 30d supply, fill #1
  Filled 2023-12-26 – 2023-12-27 (×2): qty 30, 30d supply, fill #2
  Filled 2024-01-31: qty 30, 30d supply, fill #3
  Filled 2024-02-29: qty 30, 30d supply, fill #4

## 2023-07-14 ENCOUNTER — Other Ambulatory Visit: Payer: Self-pay

## 2023-07-14 ENCOUNTER — Encounter: Payer: Self-pay | Admitting: Physician Assistant

## 2023-07-14 ENCOUNTER — Other Ambulatory Visit (HOSPITAL_COMMUNITY): Payer: Self-pay

## 2023-07-14 ENCOUNTER — Telehealth: Payer: Self-pay | Admitting: Pharmacist

## 2023-07-14 ENCOUNTER — Ambulatory Visit: Payer: 59 | Attending: Physician Assistant | Admitting: Physician Assistant

## 2023-07-14 VITALS — BP 118/80 | HR 88 | Ht 73.0 in | Wt 295.0 lb

## 2023-07-14 DIAGNOSIS — I480 Paroxysmal atrial fibrillation: Secondary | ICD-10-CM | POA: Diagnosis not present

## 2023-07-14 DIAGNOSIS — E119 Type 2 diabetes mellitus without complications: Secondary | ICD-10-CM

## 2023-07-14 DIAGNOSIS — I10 Essential (primary) hypertension: Secondary | ICD-10-CM

## 2023-07-14 DIAGNOSIS — I5042 Chronic combined systolic (congestive) and diastolic (congestive) heart failure: Secondary | ICD-10-CM

## 2023-07-14 DIAGNOSIS — I4819 Other persistent atrial fibrillation: Secondary | ICD-10-CM

## 2023-07-14 MED ORDER — AMIODARONE HCL 200 MG PO TABS
200.0000 mg | ORAL_TABLET | Freq: Every morning | ORAL | 3 refills | Status: DC
  Filled 2023-07-15 (×2): qty 30, 30d supply, fill #0
  Filled 2023-07-21 – 2023-08-08 (×3): qty 30, 30d supply, fill #1
  Filled 2023-08-31 – 2023-09-21 (×3): qty 30, 30d supply, fill #2
  Filled 2023-10-07 – 2023-11-08 (×3): qty 30, 30d supply, fill #3
  Filled 2023-12-01: qty 30, 30d supply, fill #4
  Filled 2023-12-26 – 2023-12-27 (×2): qty 30, 30d supply, fill #5
  Filled 2024-01-31: qty 30, 30d supply, fill #6
  Filled 2024-02-29: qty 30, 30d supply, fill #7

## 2023-07-14 NOTE — Progress Notes (Signed)
   07/14/2023  Patient ID: Terry Parks, male   DOB: 10-Jul-1955, 68 y.o.   MRN: [id number]  Called patient regarding medication fills. Appears everything is ready except the amiodarone, which was sent in today. Spoke with the patient and he is planning to go to the pharmacy to pick-up his medications once he finishes this errand. Provided address and he will use a GPS to get there.    Marlowe Aschoff, PharmD [facility] Phone Number: [phone]

## 2023-07-14 NOTE — Patient Instructions (Signed)
 Medication Instructions:  NO CHANGES *If you need a refill on your cardiac medications before your next appointment, please call your pharmacy*   Lab Work: NO LABS If you have labs (blood work) drawn today and your tests are completely normal, you will receive your results only by: MyChart Message (if you have MyChart) OR A paper copy in the mail If you have any lab test that is abnormal or we need to change your treatment, we will call you to review the results.   Testing/Procedures: NO TESTING   Follow-Up: At [facility], you and your health needs are our priority.  As part of our continuing mission to provide you with exceptional heart care, we have created designated Provider Care Teams.  These Care Teams include your primary Cardiologist (physician) and Advanced Practice Providers (APPs -  Physician Assistants and Nurse Practitioners) who all work together to provide you with the care you need, when you need it.   Your next appointment:   6 week(s)  Provider:   Azalee Course, PA

## 2023-07-14 NOTE — Progress Notes (Unsigned)
 Cardiology Office Note:  .   Date:  07/15/2023  ID:  Terry Parks, DOB Oct 09, 1955, MRN [id number] PCP: Olegario Messier, MD  [facility] Providers Cardiologist:  Thurmon Fair, MD Cardiology APP:  Marcelino Duster, PA     History of Present Illness: .   Terry Parks is a 68 y.o. male with past medical history of combined chronic systolic and diastolic heart failure, PAF on amiodarone and Xarelto, HTN, OSA on CPAP, CKD stage III and DM II.  He never had a cardiac catheterization however Myoview in 2013 showed fixed anterior defect, EF 30 to 35%.  He never had any angina symptom.  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  He was hospitalized in January 2019 in the setting of acute kidney injury secondary to nephrolithiasis and ureteral obstruction.  During the same admission, he had atrial fibrillation treated with cardioversion followed by recurrence of atrial flutter treated with amiodarone and anticoagulation using Xarelto.  He was admitted with heart failure in March 2021.  Discharge weight at the time was 218 pounds.  He developed a transient AKI during the hospitalization for incisional hernia repair in November 2021 but renal function has recovered.  Patient was last seen by Dr. Royann Shivers in November 2024 at which time he was euvolemic.  Blood work obtained in November showed his hemoglobin A1c was 14.9, glucose was over 600, creatinine 1.79.  TSH 1.2.  It was recommended he go to the emergency room to receive IV insulin, however he did not go as instructed.  Patient presents today for follow-up.  He says he has been compliant with the insulin and his blood sugar has been better.  Unfortunately he says he did run out of all the other cardiac medication for the past 2 weeks but he took some yesterday as he found a few tablets.  He is going to pick up the remaining prescription today and resume all of them.  I emphasized on the  importance of compliance with cardiac medication.  I asked him to monitor his blood pressure after resuming the cardiac medication to make sure his blood pressure does not drop too much.  On physical exam, he does look mildly volume overloaded with 1-2+ pitting edema in the lower extremities, however fortunately his lung is clear.  Again he has not taken the torsemide for the past 2 weeks either.  I plan to reassess the patient in 6 weeks.  ROS:   He denies chest pain, palpitations, dyspnea, pnd, orthopnea, n, v, dizziness, syncope, edema, weight gain, or early satiety. All other systems reviewed and are otherwise negative except as noted above.    Studies Reviewed: .        Cardiac Studies & Procedures   ______________________________________________________________________________________________   STRESS TESTS  MYOCARDIAL PERFUSION IMAGING 11/28/2014  Narrative  The left ventricular ejection fraction is severely decreased (<30%).  Nuclear stress EF: 25%.  Diaphragmatic attenuation with mild LV dilatation and severeal global HK C/W NISCM   ECHOCARDIOGRAM  ECHOCARDIOGRAM COMPLETE 02/23/2019  Narrative ECHOCARDIOGRAM REPORT    Patient Name:   Terry Parks Date of Exam: 02/23/2019 Medical Rec #:  [id number]         Height:       73.0 in Accession #:    [id number]        Weight:       318.9 lb Date of Birth:  [birth year]          BSA:  2.62 m Patient Age:    63 years          BP:           151/83 mmHg Patient Gender: M                 HR:           100 bpm. Exam Location:  Outpatient  Procedure: 2D Echo, Color Doppler, Cardiac Doppler and Intracardiac Opacification Agent  Indications:    I50.42 Chronic combined systolic (congestive) and diastolic (congestive) heart failure  History:        Patient has prior history of Echocardiogram examinations, most recent 01/10/2018. CHF; Arrythmias:Atrial Fibrillation Risk Factors:Hypertension, Diabetes, Dyslipidemia and  Sleep Apnea.  Sonographer:    Irving Burton Senior RDCS Referring Phys: [id number] ELIZABETH A BUTCHER  IMPRESSIONS   1. Left ventricular ejection fraction, by visual estimation, is 35 to 40%. The left ventricle has moderately decreased function. Normal left ventricular size. There is moderate left ventricular hypertrophy. There is blobal left ventricular hypokinesis, without regional variation. 2. Left ventricular diastolic Doppler parameters are consistent with impaired relaxation pattern of LV diastolic filling. 3. Global right ventricle has mildly reduced systolic function.The right ventricular size is not well visualized. Right vetricular wall thickness was not assessed. 4. Left atrial size was mildly dilated. 5. Right atrial size was mildly dilated. 6. The aortic valve is normal in structure. Aortic valve regurgitation was not visualized by color flow Doppler. 7. The aortic root was not well visualized. 8. TR signal is inadequate for assessing pulmonary artery systolic pressure. 9. The inferior vena cava is normal in size with greater than 50% respiratory variability, suggesting right atrial pressure of 3 mmHg. 10. The mitral valve is normal in structure. No evidence of mitral valve regurgitation. 11. The tricuspid valve is normal in structure. Tricuspid valve regurgitation was not visualized by color flow Doppler. 12. The pulmonic valve was not well visualized. Pulmonic valve regurgitation is not visualized by color flow Doppler.  FINDINGS Left Ventricle: Left ventricular ejection fraction, by visual estimation, is 35 to 40%. The left ventricle has moderately decreased function. There is moderately increased left ventricular hypertrophy. Concentric left ventricular hypertrophy. Normal left ventricular size. Spectral Doppler shows Left ventricular diastolic Doppler parameters are consistent with impaired relaxation pattern of LV diastolic filling. Normal left ventricular filling pressures.  Right  Ventricle: The right ventricular size is not well visualized. Right vetricular wall thickness was not assessed. Global RV systolic function is has mildly reduced systolic function.  Left Atrium: Left atrial size was mildly dilated.  Right Atrium: Right atrial size was mildly dilated  Pericardium: There is no evidence of pericardial effusion.  Mitral Valve: The mitral valve is normal in structure. No evidence of mitral valve regurgitation.  Tricuspid Valve: The tricuspid valve is normal in structure. Tricuspid valve regurgitation was not visualized by color flow Doppler.  Aortic Valve: The aortic valve is normal in structure. Aortic valve regurgitation was not visualized by color flow Doppler.  Pulmonic Valve: The pulmonic valve was not well visualized. Pulmonic valve regurgitation is not visualized by color flow Doppler.  Aorta: The aortic root was not well visualized.  Venous: A normal flow pattern is recorded from the right upper pulmonary vein. The inferior vena cava is normal in size with greater than 50% respiratory variability, suggesting right atrial pressure of 3 mmHg.  IAS/Shunts: No atrial level shunt detected by color flow Doppler.    LEFT VENTRICLE PLAX 2D LVIDd:  4.20 cm  Diastology LVIDs:         2.60 cm  LV e' lateral:   3.59 cm/s LV PW:         1.50 cm  LV E/e' lateral: 10.5 LV IVS:        1.60 cm  LV e' medial:    3.26 cm/s LVOT diam:     2.30 cm  LV E/e' medial:  11.6 LV SV:         54 ml LV SV Index:   19.33 LVOT Area:     4.15 cm   RIGHT VENTRICLE RV S prime:     10.80 cm/s TAPSE (M-mode): 1.9 cm  LEFT ATRIUM             Index       RIGHT ATRIUM           Index LA diam:        3.70 cm 1.41 cm/m  RA Area:     19.10 cm LA Vol (A2C):   71.7 ml 27.33 ml/m RA Volume:   50.40 ml  19.21 ml/m LA Vol (A4C):   63.3 ml 24.13 ml/m LA Biplane Vol: 70.4 ml 26.83 ml/m AORTIC VALVE LVOT Vmax:   58.10 cm/s LVOT Vmean:  41.700 cm/s LVOT VTI:    0.126  m  AORTA Ao Root diam: 3.60 cm Ao Asc diam:  3.20 cm  MV E velocity: 37.70 cm/s 103 cm/s SHUNTS Systemic VTI:  0.13 m Systemic Diam: 2.30 cm   Rachelle Hora [name] MD Electronically signed by Thurmon Fair MD Signature Date/Time: 02/23/2019/1:03:35 PM    Final          ______________________________________________________________________________________________      Risk Assessment/Calculations:    CHA2DS2-VASc Score = 4   This indicates a 4.8% annual risk of stroke. The patient's score is based upon: CHF History: 1 HTN History: 1 Diabetes History: 1 Stroke History: 0 Vascular Disease History: 0 Age Score: 1 Gender Score: 0            Physical Exam:   VS:  BP 118/80 (BP Location: Left Arm, Patient Position: Sitting, Cuff Size: Large)   Pulse 88   Ht 6\' 1"  (1.854 m)   Wt 295 lb (133.8 kg)   BMI 38.92 kg/m    Wt Readings from Last 3 Encounters:  07/14/23 295 lb (133.8 kg)  04/25/23 (!) 300 lb 11.2 oz (136.4 kg)  04/13/23 292 lb (132.5 kg)    GEN: Well nourished, well developed in no acute distress NECK: No JVD; No carotid bruits CARDIAC: RRR, no murmurs, rubs, gallops RESPIRATORY:  Clear to auscultation without rales, wheezing or rhonchi  ABDOMEN: Soft, non-tender, non-distended EXTREMITIES:  No edema; No deformity   ASSESSMENT AND PLAN: .    Chronic Systolic Heart Failure Patient has history of heart failure dating back to 2013 with EF 30-35%. Mild volume overload noted on physical exam with 1-2+ pitting edema in lower extremities.  -He reported that he has run out of all cardiac medication for the past 2 weeks, he was still taking his insulin.  He did find some of his medication yesterday and he took them.  I will refill all of his cardiac medication.  Emphasize has been placed on compliance with medication. -Continue current heart failure medications including carvedilol, Jardiance, Entresto, and spironolactone. -Resume torsemide for volume  overload.  Paroxysmal Atrial Fibrillation History of atrial fibrillation treated with cardioversion, recurrence of atrial flutter, and currently on amiodarone and anticoagulation  with Xarelto. -Continue amiodarone and Xarelto as prescribed.  Hyperlipidemia: On atorvastatin  Uncontrolled Diabetes Hemoglobin A1c of 14.9 and glucose over 600 in November 2024. Patient reported improved blood sugar control and compliance with insulin. -Continue current insulin regimen. -He will need close monitoring of the glucose level by PCP         Dispo: Follow-up in 6 weeks to reassess volume status.  Signed, Azalee Course, PA

## 2023-07-15 ENCOUNTER — Other Ambulatory Visit: Payer: Self-pay

## 2023-07-15 ENCOUNTER — Other Ambulatory Visit (HOSPITAL_COMMUNITY): Payer: Self-pay

## 2023-07-18 ENCOUNTER — Other Ambulatory Visit (HOSPITAL_COMMUNITY): Payer: Self-pay

## 2023-07-18 ENCOUNTER — Other Ambulatory Visit: Payer: Self-pay

## 2023-07-19 ENCOUNTER — Ambulatory Visit: Payer: Self-pay

## 2023-07-19 ENCOUNTER — Other Ambulatory Visit: Payer: Self-pay

## 2023-07-19 NOTE — Patient Instructions (Signed)
 Visit Information  Thank you for taking time to visit with me today. Please don't hesitate to contact me if I can be of assistance to you.   Following are the goals we discussed today:   Goals Addressed               This Visit's Progress     Diabetes, HTN,  CHF, Patient stated goal- to improve my health (pt-stated)        Patient Goals/Self Care Activities: -Patient/Caregiver will take medications as prescribed   -Patient/Caregiver will attend all scheduled provider appointments -Patient/Caregiver will call pharmacy for medication refills 3-7 days in advance of running out of medications -Patient/Caregiver will call provider office for new concerns or questions  -Patient/Caregiver will focus on medication adherence by taking medications as prescribed   Provided education on low sodium diet Provided education about placing scale on hard, flat surface Advised patient to weigh each morning after emptying bladder Discussed importance of daily weight and advised patient to weigh and record daily Reviewed role of diuretics in prevention of fluid overload and management of heart failure; Discussed the importance of keeping all appointments with provider Provided patient with education about the role of exercise in the management of heart failure Wt Readings from Last 3 Encounters:  07/14/23 295 lb (133.8 kg)  04/25/23 (!) 300 lb 11.2 oz (136.4 kg)  04/13/23 292 lb (132.5 kg)  Reviewed medications with patient and discussed importance of medication adherence Counseled on importance of regular laboratory monitoring as prescribed Advised patient, providing education and rationale, to check cbg  and record, calling for findings outside established parameters Review of patient status, including review of consultants reports, relevant laboratory and other test results, and medications completed Lab Results  Component Value Date   HGBA1C 14.9 (H) 04/13/2023   Last practice recorded BP  readings:  BP Readings from Last 3 Encounters:  07/14/23 118/80  04/25/23 120/74  04/13/23 110/72   Most recent eGFR/CrCl:  Lab Results  Component Value Date   EGFR 42 (L) 04/13/2023    No components found for: "CRCL" Provided education to patient re: stroke prevention, s/s of heart attack and stroke Provided assistance with obtaining home blood pressure monitor via [employer], retail; Advised patient, providing education and rationale, to monitor blood pressure daily and record, calling PCP for findings outside established parameters Discussed complications of poorly controlled blood pressure such as heart disease, stroke, circulatory complications, vision complications, kidney impairment, sexual dysfunction Make sure to obtain your BP monitor and glucometer to check and write your values Discuss the complications of not taking his medications        Our next appointment is by telephone on 08/16/23 at 10 am  Please call the care guide team at [phone] if you need to cancel or reschedule your appointment.   If you are experiencing a Mental Health or Behavioral Health Crisis or need someone to talk to, please call 1-800-273-TALK (toll free, 24 hour hotline)  Patient verbalizes understanding of instructions and care plan provided today and agrees to view in MyChart. Active MyChart status and patient understanding of how to access instructions and care plan via MyChart confirmed with patient.     Juanell Fairly RN, BSN, [facility]  [facility]  Care Coordinator Phone: [phone]

## 2023-07-19 NOTE — Patient Outreach (Signed)
 Care Coordination   Follow Up Visit Note   07/19/2023 Name: Terry Parks MRN: [id number] DOB: 12/10/1955  Terry Parks is a 68 y.o. year old male who sees Nooruddin, Jason Fila, MD for primary care. I spoke with  Terry Parks by phone today.  What matters to the patients health and wellness today?  Terry Parks reported experiencing overall well-being, except for mild swelling in his foot. He indicated that his blood pressure has shown improvement, with readings of 157/84 on February 26 and 118/80 on February 27. He has been unable to adhere to his medication regimen as prescribed because the pharmacy did not prepare his medications in pill packs; however, they are currently addressing this issue.  Furthermore, Terry Parks mentioned that he has not consistently checked his blood sugar levels, as he recently located his glucometer. He has a scheduled appointment at 8:45 AM on March 10, during which he intends to discuss the possibility of obtaining a continuous glucose monitor.   I also emphasized the importance of regular monitoring of both blood pressure and glucose levels due to their significant impact on health. Terry Parks expressed his understanding of this information, and I commended him for successfully lowering his blood pressure.    Goals Addressed               This Visit's Progress     Diabetes, HTN,  CHF, Patient stated goal- to improve my health (pt-stated)        Patient Goals/Self Care Activities: -Patient/Caregiver will take medications as prescribed   -Patient/Caregiver will attend all scheduled provider appointments -Patient/Caregiver will call pharmacy for medication refills 3-7 days in advance of running out of medications -Patient/Caregiver will call provider office for new concerns or questions  -Patient/Caregiver will focus on medication adherence by taking medications as prescribed   Provided education on low sodium diet Provided education about placing  scale on hard, flat surface Advised patient to weigh each morning after emptying bladder Discussed importance of daily weight and advised patient to weigh and record daily Reviewed role of diuretics in prevention of fluid overload and management of heart failure; Discussed the importance of keeping all appointments with provider Provided patient with education about the role of exercise in the management of heart failure Wt Readings from Last 3 Encounters:  07/14/23 295 lb (133.8 kg)  04/25/23 (!) 300 lb 11.2 oz (136.4 kg)  04/13/23 292 lb (132.5 kg)  Reviewed medications with patient and discussed importance of medication adherence Counseled on importance of regular laboratory monitoring as prescribed Advised patient, providing education and rationale, to check cbg  and record, calling for findings outside established parameters Review of patient status, including review of consultants reports, relevant laboratory and other test results, and medications completed Lab Results  Component Value Date   HGBA1C 14.9 (H) 04/13/2023   Last practice recorded BP readings:  BP Readings from Last 3 Encounters:  07/14/23 118/80  04/25/23 120/74  04/13/23 110/72   Most recent eGFR/CrCl:  Lab Results  Component Value Date   EGFR 42 (L) 04/13/2023    No components found for: "CRCL" Provided education to patient re: stroke prevention, s/s of heart attack and stroke Provided assistance with obtaining home blood pressure monitor via [employer], retail; Advised patient, providing education and rationale, to monitor blood pressure daily and record, calling PCP for findings outside established parameters Discussed complications of poorly controlled blood pressure such as heart disease, stroke, circulatory complications, vision complications, kidney impairment, sexual dysfunction Make sure to obtain  your BP monitor and glucometer to check and write your values Discuss the complications of not taking his  medications        SDOH assessments and interventions completed:  No     Care Coordination Interventions:  Yes, provided   Interventions Today    Flowsheet Row Most Recent Value  Chronic Disease   Chronic disease during today's visit Diabetes, Hypertension (HTN)  General Interventions   General Interventions Discussed/Reviewed General Interventions Discussed, General Interventions Reviewed, Durable Medical Equipment (DME)  Durable Medical Equipment (DME) Glucomoter  Pharmacy Interventions   Pharmacy Dicussed/Reviewed Pharmacy Topics Discussed  Safety Interventions   Safety Discussed/Reviewed Safety Discussed        Follow up plan: Follow up call scheduled for 08/16/23  10 am    Encounter Outcome:  Patient Visit Completed   Juanell Fairly RN, BSN, [facility]   [facility], [facility]  Care Coordinator Phone: [phone]

## 2023-07-20 ENCOUNTER — Other Ambulatory Visit (HOSPITAL_COMMUNITY): Payer: Self-pay

## 2023-07-20 DIAGNOSIS — Z136 Encounter for screening for cardiovascular disorders: Secondary | ICD-10-CM | POA: Diagnosis not present

## 2023-07-20 DIAGNOSIS — E559 Vitamin D deficiency, unspecified: Secondary | ICD-10-CM | POA: Diagnosis not present

## 2023-07-20 DIAGNOSIS — E1169 Type 2 diabetes mellitus with other specified complication: Secondary | ICD-10-CM | POA: Diagnosis not present

## 2023-07-20 DIAGNOSIS — Z1159 Encounter for screening for other viral diseases: Secondary | ICD-10-CM | POA: Diagnosis not present

## 2023-07-20 DIAGNOSIS — I10 Essential (primary) hypertension: Secondary | ICD-10-CM | POA: Diagnosis not present

## 2023-07-20 DIAGNOSIS — I5042 Chronic combined systolic (congestive) and diastolic (congestive) heart failure: Secondary | ICD-10-CM | POA: Diagnosis not present

## 2023-07-20 DIAGNOSIS — E1165 Type 2 diabetes mellitus with hyperglycemia: Secondary | ICD-10-CM | POA: Diagnosis not present

## 2023-07-20 DIAGNOSIS — Z0189 Encounter for other specified special examinations: Secondary | ICD-10-CM | POA: Diagnosis not present

## 2023-07-20 DIAGNOSIS — Z79899 Other long term (current) drug therapy: Secondary | ICD-10-CM | POA: Diagnosis not present

## 2023-07-20 DIAGNOSIS — D509 Iron deficiency anemia, unspecified: Secondary | ICD-10-CM | POA: Diagnosis not present

## 2023-07-20 DIAGNOSIS — I4819 Other persistent atrial fibrillation: Secondary | ICD-10-CM | POA: Diagnosis not present

## 2023-07-21 ENCOUNTER — Other Ambulatory Visit (HOSPITAL_BASED_OUTPATIENT_CLINIC_OR_DEPARTMENT_OTHER): Payer: Self-pay

## 2023-07-21 ENCOUNTER — Other Ambulatory Visit: Payer: Self-pay | Admitting: Internal Medicine

## 2023-07-21 ENCOUNTER — Other Ambulatory Visit: Payer: Self-pay

## 2023-07-21 DIAGNOSIS — Z79899 Other long term (current) drug therapy: Secondary | ICD-10-CM | POA: Diagnosis not present

## 2023-07-21 DIAGNOSIS — Z1159 Encounter for screening for other viral diseases: Secondary | ICD-10-CM | POA: Diagnosis not present

## 2023-07-21 DIAGNOSIS — Z0189 Encounter for other specified special examinations: Secondary | ICD-10-CM | POA: Diagnosis not present

## 2023-07-21 DIAGNOSIS — I5042 Chronic combined systolic (congestive) and diastolic (congestive) heart failure: Secondary | ICD-10-CM

## 2023-07-21 DIAGNOSIS — Z136 Encounter for screening for cardiovascular disorders: Secondary | ICD-10-CM | POA: Diagnosis not present

## 2023-07-21 NOTE — Telephone Encounter (Signed)
 Medication last refilled on 07/07/2023 with 2 refills.

## 2023-07-25 ENCOUNTER — Other Ambulatory Visit (HOSPITAL_COMMUNITY): Payer: Self-pay

## 2023-07-25 ENCOUNTER — Ambulatory Visit (INDEPENDENT_AMBULATORY_CARE_PROVIDER_SITE_OTHER): Admitting: Student

## 2023-07-25 ENCOUNTER — Encounter: Payer: 59 | Admitting: Student

## 2023-07-25 VITALS — BP 150/87 | HR 82 | Temp 97.6°F | Ht 73.0 in | Wt 290.0 lb

## 2023-07-25 DIAGNOSIS — I152 Hypertension secondary to endocrine disorders: Secondary | ICD-10-CM

## 2023-07-25 DIAGNOSIS — Z794 Long term (current) use of insulin: Secondary | ICD-10-CM

## 2023-07-25 DIAGNOSIS — E1165 Type 2 diabetes mellitus with hyperglycemia: Secondary | ICD-10-CM

## 2023-07-25 DIAGNOSIS — E1159 Type 2 diabetes mellitus with other circulatory complications: Secondary | ICD-10-CM

## 2023-07-25 DIAGNOSIS — E1142 Type 2 diabetes mellitus with diabetic polyneuropathy: Secondary | ICD-10-CM

## 2023-07-25 DIAGNOSIS — Z7984 Long term (current) use of oral hypoglycemic drugs: Secondary | ICD-10-CM

## 2023-07-25 LAB — POCT GLYCOSYLATED HEMOGLOBIN (HGB A1C): HbA1c POC (<> result, manual entry): >14 % — AB (ref 4.0–5.6)

## 2023-07-25 LAB — GLUCOSE, CAPILLARY: Glucose-Capillary: 286 mg/dL — ABNORMAL HIGH (ref 70–99)

## 2023-07-25 MED ORDER — LANCETS MISC
3 refills | Status: DC
Start: 2023-07-25 — End: 2024-02-29
  Filled 2023-07-25: qty 300, 90d supply, fill #0
  Filled 2023-12-27: qty 400, 90d supply, fill #1

## 2023-07-25 MED ORDER — NOVOLOG FLEXPEN 100 UNIT/ML ~~LOC~~ SOPN
PEN_INJECTOR | SUBCUTANEOUS | 3 refills | Status: DC
  Filled 2023-07-25: qty 9, 25d supply, fill #0
  Filled 2023-09-07 (×2): qty 9, 25d supply, fill #1

## 2023-07-25 MED ORDER — ACCU-CHEK GUIDE W/DEVICE KIT
PACK | 0 refills | Status: DC
  Filled 2023-07-25: qty 1, 30d supply, fill #0

## 2023-07-25 MED ORDER — DEXCOM G7 SENSOR MISC
3 refills | Status: DC
  Filled 2023-07-25: qty 1, 14d supply, fill #0
  Filled 2023-08-04 – 2023-08-05 (×2): qty 1, 14d supply, fill #1
  Filled 2023-08-19: qty 1, 10d supply, fill #2
  Filled 2023-08-31 (×2): qty 1, 10d supply, fill #3

## 2023-07-25 MED ORDER — LANTUS SOLOSTAR 100 UNIT/ML ~~LOC~~ SOPN
60.0000 [IU] | PEN_INJECTOR | Freq: Every day | SUBCUTANEOUS | 11 refills | Status: DC
  Filled 2023-07-25: qty 15, 25d supply, fill #0

## 2023-07-25 MED ORDER — ACCU-CHEK GUIDE TEST VI STRP
ORAL_STRIP | 3 refills | Status: DC
Start: 2023-07-25 — End: 2024-02-29
  Filled 2023-07-25: qty 400, 90d supply, fill #0
  Filled 2023-12-27: qty 400, 90d supply, fill #1

## 2023-07-25 NOTE — Progress Notes (Signed)
 CC: Diabetes follow-up  HPI:  [name] is a 68 y.o. male living with a history stated below and presents today for diabetes follow-up. Please see problem based assessment and plan for additional details.  Past Medical History:  Diagnosis Date   Abscessed tooth    top back large cavity no pain or drainage, one on bottom  pt pulled tooth 4-5 months ago, right top large hole in tooth   AKI (acute kidney injury) (HCC)    Allergic rhinitis    Anemia    Anxiety    Asthma    Atrial fibrillation (HCC)    BPH (benign prostatic hypertrophy)    Massive BPH noted on cystoscopy 1/23/ 2012 by Dr. Isabel Parks.   Cancer [facility])    prostate cancer 2019   Cardiomyopathy [facility])    CHF (congestive heart failure) (HCC)    Cough 03/30/2012   Depression    Diabetes mellitus 04/08/2008   type 2   Dyspnea    Dysrhythmia 2019   Foley catheter in place 07-05-17 placed   Fracture, orbital (HCC) 2021   Right   GERD (gastroesophageal reflux disease) 2020   Headache(784.0)    hx migraines none recent   History of esophagitis 12/16/2019   Hyperlipemia    Hypertension    Hypertensive cardiopathy 03/01/2006   2-D echocardiogram 02/01/2012 showed moderate LVH, mildly to moderately reduced left ventricular systolic function with an estimated ejection fraction of 40-45%, and diffuse hypokinesis.  A nuclear medicine stress study done 01/31/2012 showed no reversible ischemia, a small mid anterior wall fixed defect/infarct, and ejection fraction 42%.       Neck pain    Nephrolithiasis 05/29/2010   CT scan of abdomen/pelvis on 05/29/2010 showed an obstructing approximate 1-2 mm calculus at the left UVJ, and an approximate 1-2 mm left lower pole renal calculus.   Patient had continuing severe pain , and an elevation of his serum creatinine to a value of 1.75 on 06/06/2010.  Patient underwent cystoscopy on 06/08/2010 by Dr. Isabel Parks, but attempts at retrograde pyelogram and ureteroscopy were unsucc   Numbness  01/08/2018   Obstructive sleep apnea 03/06/2008   Sleep study 03/06/08 showed severe OSA/hypopnea syndrome, with successful CPAP titration to 13 CWP using a medium ResMed Mirage Quattro full face mask with heated humidifier.    Personal history of prostate cancer 10/06/2017   Rash 04/17/2014   Renal calculus 05/29/2010   CT scan of abdomen/pelvis on 05/29/2010 showed an obstructing approximate 1-2 mm calculus at the left UVJ, and an approximate 1-2 mm left lower pole renal calculus.   Patient had continuing severe pain , and an elevation of his serum creatinine to a value of 1.75 on 06/06/2010.  The stone had apparently passed and was not seen on repeat CT 06/08/2010.   Sleep apnea    haven't use cpap in 2 years   Tooth pain 10/29/2017   Uncontrolled type 2 diabetes mellitus with hyperglycemia, with long-term current use of insulin (HCC) 04/08/2008   GLP1 discontinued at hospital discharge 06/2022; concern about esophagitis? Delayed gastric emptying?  Intolerant to metformin     Urinary straining 11/02/2016    Current Outpatient Medications on File Prior to Visit  Medication Sig Dispense Refill   Accu-Chek Softclix Lancets lancets Check blood sugar three times a day as instructed 300 each 11   amiodarone (PACERONE) 200 MG tablet Take 1 tablet (200 mg total) by mouth every morning. 90 tablet 3   amLODipine (NORVASC) 5 MG tablet Take  1 tablet (5 mg total) by mouth every evening. 30 tablet 6   apixaban (ELIQUIS) 5 MG TABS tablet Take 1 tablet (5 mg total) by mouth 2 (two) times daily. 60 tablet 11   atorvastatin (LIPITOR) 40 MG tablet Take 1 tablet (40 mg total) by mouth every evening. 30 tablet 6   Blood Glucose Monitoring Suppl (ACCU-CHEK GUIDE) w/Device KIT Use as directed to check blood sugar. 1 kit 0   Blood Glucose Monitoring Suppl (ACCU-CHEK GUIDE) w/Device KIT Use to monitor Blood Sugars 3 times daily with meals 1 kit 0   carvedilol (COREG) 25 MG tablet Take 1 tablet (25 mg total) by mouth 2  (two) times daily with a meal. 180 tablet 6   cephALEXin (KEFLEX) 500 MG capsule Take one capsule (500 mg dose) by mouth 2 (two) times daily for 3 days. Take first dose the morning of urodynamics (bladder test) 6 capsule 0   COMFORT EZ PEN NEEDLES 31G X 6 MM MISC USE AS DIRECTED AT BEDTIME 100 each 3   COMFORT EZ PEN NEEDLES 32G X 6 MM MISC      Dexlansoprazole (DEXILANT) 30 MG capsule DR Take 1 capsule (30 mg total) by mouth 2 (two) times daily. 60 capsule 12   DULoxetine (CYMBALTA) 60 MG capsule Take 1 capsule (60 mg total) by mouth daily. 90 capsule 3   empagliflozin (JARDIANCE) 25 MG TABS tablet Take 1 tablet (25 mg total) by mouth every morning. 90 tablet 3   ezetimibe (ZETIA) 10 MG tablet Take 1 tablet (10 mg total) by mouth every evening. 90 tablet 2   glucose blood (ACCU-CHEK GUIDE TEST) test strip Use to check blood sugar up to 4 (four) times daily. 100 each 12   glucose blood (ACCU-CHEK GUIDE TEST) test strip Use to test Blood Sugars 3 (three) times per day with meals and as needed 400 strip 3   insulin aspart (NOVOLOG FLEXPEN) 100 UNIT/ML FlexPen Inject 3 times daily with meals with the following scale: 150-200=2units, 201-250=4units, 251-300=6units, 301-350=8units, 351-400=10units, 401-450=12units. Max daily dosage 36units. Call for blood sugars greater than 450. 36 mL 3   Lancets MISC Use to test Blood Sugars 3 (three) times per day with meals and as needed 360 each 3   mirabegron ER (MYRBETRIQ) 25 MG TB24 tablet Take 2 tablets (50 mg total) by mouth 1 hour prior to bedtime daily. 90 tablet 3   oxybutynin (DITROPAN XL) 15 MG 24 hr tablet Take 1 tablet (15 mg total) by mouth daily. 90 tablet 3   oxybutynin (DITROPAN XL) 15 MG 24 hr tablet Take one tablet (15 mg dose) by mouth daily. 90 tablet 3   sacubitril-valsartan (ENTRESTO) 97-103 MG Take 1 tablet by mouth 2 (two) times daily. 180 tablet 6   SITagliptin 50 MG TABS Take 1 tablet by mouth daily. 30 tablet 11   spironolactone  (ALDACTONE) 50 MG tablet Take 1 tablet (50 mg total) by mouth every morning. 30 tablet 6   torsemide (DEMADEX) 20 MG tablet Take 2 tablets (40 mg total) by mouth daily. 30 tablet 2   No current facility-administered medications on file prior to visit.    Family History  Problem Relation Age of Onset   Diabetes Mother    Ulcerative colitis Mother    Breast cancer Mother    Hypertension Father    Stroke Father    Diabetes Maternal Grandmother    Colon cancer Neg Hx    Prostate cancer Neg Hx    Heart  attack Neg Hx    Esophageal cancer Neg Hx    Inflammatory bowel disease Neg Hx    Liver disease Neg Hx    Pancreatic cancer Neg Hx    Rectal cancer Neg Hx    Stomach cancer Neg Hx    Colon polyps Neg Hx     Social History   Socioeconomic History   Marital status: Single    Spouse name: Not on file   Number of children: 4   Years of education: 16   Highest education level: Not on file  Occupational History   Occupation:        Employer: UNEMPLOYED  Tobacco Use   Smoking status: Never   Smokeless tobacco: Never  Vaping Use   Vaping status: Never Used  Substance and Sexual Activity   Alcohol use: No    Alcohol/week: 0.0 standard drinks of alcohol   Drug use: No   Sexual activity: Not Currently  Other Topics Concern   Not on file  Social History Narrative   Divorced, 4 children, lives alone.     Social Drivers of Corporate investment banker Strain: Low Risk  (07/12/2023)   Received from [facility]   Overall Financial Resource Strain (CARDIA)    Difficulty of Paying Living Expenses: Not hard at all  Food Insecurity: No Food Insecurity (07/12/2023)   Received from [facility]   Hunger Vital Sign    Worried About Running Out of Food in the Last Year: Never true    Ran Out of Food in the Last Year: Never true  Transportation Needs: No Transportation Needs (07/12/2023)   Received from [facility] - Transportation    Lack of Transportation (Medical): No     Lack of Transportation (Non-Medical): No  Physical Activity: Sufficiently Active (08/12/2022)   Exercise Vital Sign    Days of Exercise per Week: 7 days    Minutes of Exercise per Session: 30 min  Stress: No Stress Concern Present (07/15/2022)   [employer] of Occupational Health - Occupational Stress Questionnaire    Feeling of Stress : Only a little  Social Connections: Moderately Integrated (08/12/2022)   Social Connection and Isolation Panel [NHANES]    Frequency of Communication with Friends and Family: More than three times a week    Frequency of Social Gatherings with Friends and Family: More than three times a week    Attends Religious Services: More than 4 times per year    Active Member of [employer] or Organizations: Yes    Attends Engineer, structural: More than 4 times per year    Marital Status: Divorced  Intimate Partner Violence: Not At Risk (06/23/2022)   Humiliation, Afraid, Rape, and Kick questionnaire    Fear of Current or Ex-Partner: No    Emotionally Abused: No    Physically Abused: No    Sexually Abused: No    Review of Systems: ROS negative except for what is noted on the assessment and plan.  Vitals:   07/25/23 1056 07/25/23 1126  BP: (!) 158/97 (!) 150/87  Pulse: 84 82  Temp: 97.6 F (36.4 C)   TempSrc: Oral   SpO2: 100%   Weight: 290 lb (131.5 kg)   Height: 6\' 1"  (1.854 m)     Physical Exam: Constitutional: well-appearing male  in no acute distress Cardiovascular: regular rate and rhythm, no m/r/g Pulmonary/Chest: normal work of breathing on room air, lungs clear to auscultation bilaterally Abdominal: soft, non-tender, non-distended Neurological:  alert & oriented x 3, 5/5 strength in bilateral upper and lower extremities, normal gait   Assessment & Plan:   Uncontrolled type 2 diabetes mellitus with hyperglycemia, with long-term current use of insulin [facility]) Patient presents for follow-up regarding his type 2 diabetes.  His last A1c  was 14.9, today it is still above 14.  His current regimen includes Lantus 56 units (prescribed 60 units), sitagliptin 50 mg, and Jardiance 25 mg.  He does not check his glucose at home as he has lost his meter however he found it yesterday.  He states that he is compliant with his medication and he takes it daily.  At this time, it seems like his diet and lack of exercise resulting in back from improved glycemic control.  He is already on extensive diabetic regimen, and you will expect to see better results at this point.  He states that he drinks sweet tea and puts extra sugar at night, I cautioned him against doing so.  He is very motivated to change his diet and start exercising more.  He has joined Therapist, occupational.  He is hesitant to make any changes in his regimen, likely due to burden as he has a trouble maintaining all of his medications before.  He is not interested in using another injection like Ozempic.  Was able to discuss that if at his next visit he does not have improved glycemic control, changes to his diabetic regimen would be in his best interest.  Plan: -Dexcom prescribed, patient will bring in next visit - Continue Lantus 60 units - Continue sitagliptin 50 mg - Continue Jardiance 25 mg  Hypertension associated with diabetes (HCC) Hypertensive today in clinic, however patient did not take his medications this morning.  Patient discussed with Dr. Cherrie Gauze, M.D. [facility] Internal Medicine, PGY-2 Pager: [phone] Date 07/25/2023 Time 3:53 PM

## 2023-07-25 NOTE — Assessment & Plan Note (Signed)
 Patient presents for follow-up regarding his type 2 diabetes.  His last A1c was 14.9, today it is still above 14.  His current regimen includes Lantus 56 units (prescribed 60 units), sitagliptin 50 mg, and Jardiance 25 mg.  He does not check his glucose at home as he has lost his meter however he found it yesterday.  He states that he is compliant with his medication and he takes it daily.  At this time, it seems like his diet and lack of exercise resulting in back from improved glycemic control.  He is already on extensive diabetic regimen, and you will expect to see better results at this point.  He states that he drinks sweet tea and puts extra sugar at night, I cautioned him against doing so.  He is very motivated to change his diet and start exercising more.  He has joined Therapist, occupational.  He is hesitant to make any changes in his regimen, likely due to burden as he has a trouble maintaining all of his medications before.  He is not interested in using another injection like Ozempic.  Was able to discuss that if at his next visit he does not have improved glycemic control, changes to his diabetic regimen would be in his best interest.  Plan: -Dexcom prescribed, patient will bring in next visit - Continue Lantus 60 units - Continue sitagliptin 50 mg - Continue Jardiance 25 mg

## 2023-07-25 NOTE — Patient Instructions (Signed)
 Thank you so much for coming to the clinic today!   I would really like to make some changes to your regimen today, however I'm ok with having you change your diet and increase exercise and re-evaluating in three months. I am going to send in a continuous glucose monitor for you which I think will help a lot.   If you have any questions please feel free to the call the clinic at anytime at [phone]. It was a pleasure seeing you!  Best, Dr. Thomasene Ripple

## 2023-07-25 NOTE — Assessment & Plan Note (Signed)
 Hypertensive today in clinic, however patient did not take his medications this morning.

## 2023-07-26 NOTE — Progress Notes (Signed)
 Internal Medicine Clinic Attending  Case discussed with the resident at the time of the visit.  We reviewed the resident's history and exam and pertinent patient test results.  I agree with the assessment, diagnosis, and plan of care documented in the resident's note.

## 2023-07-27 ENCOUNTER — Other Ambulatory Visit (HOSPITAL_COMMUNITY): Payer: Self-pay

## 2023-08-04 ENCOUNTER — Other Ambulatory Visit (HOSPITAL_COMMUNITY): Payer: Self-pay

## 2023-08-05 ENCOUNTER — Other Ambulatory Visit (HOSPITAL_COMMUNITY): Payer: Self-pay

## 2023-08-08 ENCOUNTER — Other Ambulatory Visit: Payer: Self-pay

## 2023-08-08 ENCOUNTER — Other Ambulatory Visit (HOSPITAL_COMMUNITY): Payer: Self-pay

## 2023-08-09 ENCOUNTER — Other Ambulatory Visit: Payer: Self-pay

## 2023-08-11 ENCOUNTER — Other Ambulatory Visit (HOSPITAL_COMMUNITY): Payer: Self-pay

## 2023-08-11 ENCOUNTER — Other Ambulatory Visit: Payer: Self-pay

## 2023-08-11 ENCOUNTER — Other Ambulatory Visit: Payer: Self-pay | Admitting: Student

## 2023-08-11 DIAGNOSIS — Z1211 Encounter for screening for malignant neoplasm of colon: Secondary | ICD-10-CM | POA: Diagnosis not present

## 2023-08-11 DIAGNOSIS — N183 Chronic kidney disease, stage 3 unspecified: Secondary | ICD-10-CM | POA: Diagnosis not present

## 2023-08-11 DIAGNOSIS — D6869 Other thrombophilia: Secondary | ICD-10-CM | POA: Diagnosis not present

## 2023-08-11 DIAGNOSIS — E1165 Type 2 diabetes mellitus with hyperglycemia: Secondary | ICD-10-CM | POA: Diagnosis not present

## 2023-08-11 DIAGNOSIS — I5042 Chronic combined systolic (congestive) and diastolic (congestive) heart failure: Secondary | ICD-10-CM | POA: Diagnosis not present

## 2023-08-11 DIAGNOSIS — E1122 Type 2 diabetes mellitus with diabetic chronic kidney disease: Secondary | ICD-10-CM | POA: Diagnosis not present

## 2023-08-11 DIAGNOSIS — E559 Vitamin D deficiency, unspecified: Secondary | ICD-10-CM | POA: Diagnosis not present

## 2023-08-11 DIAGNOSIS — E785 Hyperlipidemia, unspecified: Secondary | ICD-10-CM | POA: Diagnosis not present

## 2023-08-11 DIAGNOSIS — E119 Type 2 diabetes mellitus without complications: Secondary | ICD-10-CM | POA: Diagnosis not present

## 2023-08-11 DIAGNOSIS — Z0001 Encounter for general adult medical examination with abnormal findings: Secondary | ICD-10-CM | POA: Diagnosis not present

## 2023-08-11 DIAGNOSIS — E1169 Type 2 diabetes mellitus with other specified complication: Secondary | ICD-10-CM | POA: Diagnosis not present

## 2023-08-11 MED ORDER — VITAMIN D (ERGOCALCIFEROL) 1.25 MG (50000 UNIT) PO CAPS: 50000.0000 [IU] | ORAL_CAPSULE | ORAL | 0 refills | Status: DC

## 2023-08-11 MED ORDER — VITAMIN D (ERGOCALCIFEROL) 50000 UNITS PO CAPS
1.0000 | ORAL_CAPSULE | ORAL | 0 refills | Status: DC
  Filled 2023-08-11 – 2023-09-01 (×3): qty 12, 84d supply, fill #0

## 2023-08-11 NOTE — Progress Notes (Signed)
 Vitamin D level very low three months ago at 7.2. Will trial high dose vitamin D for 8 weeks, and then recheck at his next appointment.   Olegario Messier, MD PGY-2 Internal Medicine Resident

## 2023-08-16 ENCOUNTER — Ambulatory Visit: Payer: Self-pay

## 2023-08-16 NOTE — Patient Instructions (Signed)
 Visit Information  Thank you for taking time to visit with me today. Please don't hesitate to contact me if I can be of assistance to you.   Following are the goals we discussed today:   Goals Addressed               This Visit's Progress     Diabetes, HTN,  CHF, Patient stated goal- to improve my health (pt-stated)        Patient Goals/Self Care Activities: -Patient/Caregiver will take medications as prescribed   -Patient/Caregiver will attend all scheduled provider appointments -Patient/Caregiver will call pharmacy for medication refills 3-7 days in advance of running out of medications -Patient/Caregiver will call provider office for new concerns or questions  -Patient/Caregiver will focus on medication adherence by taking medications as prescribed   Provided education on low sodium diet Provided education about placing scale on hard, flat surface Advised patient to weigh each morning after emptying bladder Discussed importance of daily weight and advised patient to weigh and record daily Reviewed role of diuretics in prevention of fluid overload and management of heart failure; Discussed the importance of keeping all appointments with provider Provided patient with education about the role of exercise in the management of heart failure Wt Readings from Last 3 Encounters:  07/25/23 290 lb (131.5 kg)  07/14/23 295 lb (133.8 kg)  04/25/23 (!) 300 lb 11.2 oz (136.4 kg)  Reviewed medications with patient and discussed importance of medication adherence Counseled on importance of regular laboratory monitoring as prescribed Advised patient, providing education and rationale, to check cbg  and record, calling for findings outside established parameters Review of patient status, including review of consultants reports, relevant laboratory and other test results, and medications completed Lab Results  Component Value Date   HGBA1C >14.0 (A) 07/25/2023   Last practice recorded BP  readings:  BP Readings from Last 3 Encounters:  07/25/23 (!) 150/87  07/14/23 118/80  04/25/23 120/74   Most recent eGFR/CrCl:  Lab Results  Component Value Date   EGFR 42 (L) 04/13/2023    No components found for: "CRCL" Provided education to patient re: stroke prevention, s/s of heart attack and stroke Provided assistance with obtaining home blood pressure monitor via [employer], retail; Advised patient, providing education and rationale, to monitor blood pressure daily and record, calling PCP for findings outside established parameters Discussed complications of poorly controlled blood pressure such as heart disease, stroke, circulatory complications, vision complications, kidney impairment, sexual dysfunction Make sure to obtain your BP monitor and glucometer to check and write your values Discuss the complications of not taking his medications        Our next appointment is by telephone on 08/31/23 at 1130 am  Please call the care guide team at [phone] if you need to cancel or reschedule your appointment.   If you are experiencing a Mental Health or Behavioral Health Crisis or need someone to talk to, please call 1-800-273-TALK (toll free, 24 hour hotline)  Patient verbalizes understanding of instructions and care plan provided today and agrees to view in MyChart. Active MyChart status and patient understanding of how to access instructions and care plan via MyChart confirmed with patient.     Juanell Fairly RN, BSN, [facility] [place]  [facility], [facility]  Care Coordinator Phone: [phone]

## 2023-08-16 NOTE — Patient Outreach (Signed)
 Care Coordination   Follow Up Visit Note   08/16/2023 Name: Terry Parks MRN: [id number] DOB: [birth year]  Terry Parks is a 68 y.o. year old male who sees Terry Parks, Terry Fila, MD for primary care. I spoke with  Terry Parks by phone today.  What matters to the patients health and wellness today?  [name] is currently managing his condition with the assistance of a Dexcom G7, which eliminates the necessity for fingerstick blood sugar checks. Nevertheless, he reported variability in his blood pressure, which he has not assessed this morning. His blood sugar reading was 351, despite him not having consumed food or drink.  While his A1C has improved to 14.0, it remains at an unacceptably high level. During our discussion, I emphasized the critical importance of consistent monitoring of both his blood sugar and blood pressure, as well as the potential health implications associated with these parameters. I urged him to prioritize his health by adhering to his medication regimen and routinely checking his vital signs.  As an initial step, [name] will begin by checking his blood pressure every morning, taking his medications as prescribed, and recording his blood sugarand pressure readings. I will schedule a follow-up appointment in two weeks to evaluate his progress.     Goals Addressed               This Visit's Progress     Diabetes, HTN,  CHF, Patient stated goal- to improve my health (pt-stated)        Patient Goals/Self Care Activities: -Patient/Caregiver will take medications as prescribed   -Patient/Caregiver will attend all scheduled provider appointments -Patient/Caregiver will call pharmacy for medication refills 3-7 days in advance of running out of medications -Patient/Caregiver will call provider office for new concerns or questions  -Patient/Caregiver will focus on medication adherence by taking medications as prescribed   Provided education on low sodium  diet Provided education about placing scale on hard, flat surface Advised patient to weigh each morning after emptying bladder Discussed importance of daily weight and advised patient to weigh and record daily Reviewed role of diuretics in prevention of fluid overload and management of heart failure; Discussed the importance of keeping all appointments with provider Provided patient with education about the role of exercise in the management of heart failure Wt Readings from Last 3 Encounters:  07/25/23 290 lb (131.5 kg)  07/14/23 295 lb (133.8 kg)  04/25/23 (!) 300 lb 11.2 oz (136.4 kg)  Reviewed medications with patient and discussed importance of medication adherence Counseled on importance of regular laboratory monitoring as prescribed Advised patient, providing education and rationale, to check cbg  and record, calling for findings outside established parameters Review of patient status, including review of consultants reports, relevant laboratory and other test results, and medications completed Lab Results  Component Value Date   HGBA1C >14.0 (A) 07/25/2023   Last practice recorded BP readings:  BP Readings from Last 3 Encounters:  07/25/23 (!) 150/87  07/14/23 118/80  04/25/23 120/74   Most recent eGFR/CrCl:  Lab Results  Component Value Date   EGFR 42 (L) 04/13/2023    No components found for: "CRCL" Provided education to patient re: stroke prevention, s/s of heart attack and stroke Provided assistance with obtaining home blood pressure monitor via [employer]; Advised patient, providing education and rationale, to monitor blood pressure daily and record, calling PCP for findings outside established parameters Discussed complications of poorly controlled blood pressure such as heart disease, stroke, circulatory complications, vision complications, kidney  impairment, sexual dysfunction Make sure to obtain your BP monitor and glucometer to check and write your  values Discuss the complications of not taking his medications        SDOH assessments and interventions completed:  No     Care Coordination Interventions:  Yes, provided   Interventions Today    Flowsheet Row Most Recent Value  Chronic Disease   Chronic disease during today's visit Diabetes, Hypertension (HTN)  General Interventions   General Interventions Discussed/Reviewed General Interventions Discussed, General Interventions Reviewed, Labs, Doctor Visits, Durable Medical Equipment (DME)  Labs Hgb A1c every 3 months  Doctor Visits Discussed/Reviewed Doctor Visits Discussed  Durable Medical Equipment (DME) Glucomoter  Exercise Interventions   Exercise Discussed/Reviewed Physical Activity  Nutrition Interventions   Nutrition Discussed/Reviewed Nutrition Discussed  Pharmacy Interventions   Pharmacy Dicussed/Reviewed Pharmacy Topics Discussed  Safety Interventions   Safety Discussed/Reviewed Safety Discussed        Follow up plan: Follow up call scheduled for 08/31/23  1130 am    Encounter Outcome:  Patient Visit Completed   Juanell Fairly RN, BSN, [facility] [place]  [facility], [facility]  Care Coordinator Phone: [phone]

## 2023-08-18 ENCOUNTER — Other Ambulatory Visit (HOSPITAL_COMMUNITY): Payer: Self-pay

## 2023-08-18 ENCOUNTER — Other Ambulatory Visit: Payer: Self-pay

## 2023-08-19 ENCOUNTER — Other Ambulatory Visit (HOSPITAL_COMMUNITY): Payer: Self-pay

## 2023-08-24 ENCOUNTER — Other Ambulatory Visit (HOSPITAL_COMMUNITY): Payer: Self-pay

## 2023-08-24 MED ORDER — CEPHALEXIN 500 MG PO CAPS
500.0000 mg | ORAL_CAPSULE | Freq: Four times a day (QID) | ORAL | 0 refills | Status: DC
  Filled 2023-08-24: qty 28, 7d supply, fill #0

## 2023-08-26 ENCOUNTER — Encounter (HOSPITAL_BASED_OUTPATIENT_CLINIC_OR_DEPARTMENT_OTHER): Payer: Self-pay

## 2023-08-29 ENCOUNTER — Ambulatory Visit: Payer: 59 | Attending: Physician Assistant | Admitting: Physician Assistant

## 2023-08-31 ENCOUNTER — Other Ambulatory Visit: Payer: Self-pay

## 2023-08-31 ENCOUNTER — Ambulatory Visit: Payer: Self-pay

## 2023-08-31 ENCOUNTER — Other Ambulatory Visit (HOSPITAL_COMMUNITY): Payer: Self-pay

## 2023-08-31 NOTE — Patient Outreach (Signed)
 Complex Care Management   Visit Note  08/31/2023  Name:  Terry Parks MRN: [id number] DOB: 1956-02-22  Situation: Referral received for Complex Care Management related to Diabetes with Complications and HTN  I obtained verbal consent from Patient.  Visit completed with Patient  on the phone  Background:   Past Medical History:  Diagnosis Date   Abscessed tooth    top back large cavity no pain or drainage, one on bottom  pt pulled tooth 4-5 months ago, right top large hole in tooth   AKI (acute kidney injury) (HCC)    Allergic rhinitis    Anemia    Anxiety    Asthma    Atrial fibrillation (HCC)    BPH (benign prostatic hypertrophy)    Massive BPH noted on cystoscopy 1/23/ 2012 by Dr. Isabel Caprice.   Cancer [facility])    prostate cancer 2019   Cardiomyopathy [facility])    CHF (congestive heart failure) (HCC)    Cough 03/30/2012   Depression    Diabetes mellitus 04/08/2008   type 2   Dyspnea    Dysrhythmia 2019   Foley catheter in place 07-05-17 placed   Fracture, orbital (HCC) 2021   Right   GERD (gastroesophageal reflux disease) 2020   Headache(784.0)    hx migraines none recent   History of esophagitis 12/16/2019   Hyperlipemia    Hypertension    Hypertensive cardiopathy 03/01/2006   2-D echocardiogram 02/01/2012 showed moderate LVH, mildly to moderately reduced left ventricular systolic function with an estimated ejection fraction of 40-45%, and diffuse hypokinesis.  A nuclear medicine stress study done 01/31/2012 showed no reversible ischemia, a small mid anterior wall fixed defect/infarct, and ejection fraction 42%.       Neck pain    Nephrolithiasis 05/29/2010   CT scan of abdomen/pelvis on 05/29/2010 showed an obstructing approximate 1-2 mm calculus at the left UVJ, and an approximate 1-2 mm left lower pole renal calculus.   Patient had continuing severe pain , and an elevation of his serum creatinine to a value of 1.75 on 06/06/2010.  Patient underwent cystoscopy on 06/08/2010 by Dr.  Isabel Caprice, but attempts at retrograde pyelogram and ureteroscopy were unsucc   Numbness 01/08/2018   Obstructive sleep apnea 03/06/2008   Sleep study 03/06/08 showed severe OSA/hypopnea syndrome, with successful CPAP titration to 13 CWP using a medium ResMed Mirage Quattro full face mask with heated humidifier.    Personal history of prostate cancer 10/06/2017   Rash 04/17/2014   Renal calculus 05/29/2010   CT scan of abdomen/pelvis on 05/29/2010 showed an obstructing approximate 1-2 mm calculus at the left UVJ, and an approximate 1-2 mm left lower pole renal calculus.   Patient had continuing severe pain , and an elevation of his serum creatinine to a value of 1.75 on 06/06/2010.  The stone had apparently passed and was not seen on repeat CT 06/08/2010.   Sleep apnea    haven't use cpap in 2 years   Tooth pain 10/29/2017   Uncontrolled type 2 diabetes mellitus with hyperglycemia, with long-term current use of insulin (HCC) 04/08/2008   GLP1 discontinued at hospital discharge 06/2022; concern about esophagitis? Delayed gastric emptying?  Intolerant to metformin     Urinary straining 11/02/2016    Assessment: Patient Reported Symptoms:  Cognitive Cognitive Status: Able to follow simple commands, Alert and oriented to person, place, and time, Insightful and able to interpret abstract concepts   Healing Pattern: Average  Neurological      HEENT HEENT Symptoms  Reported: No symptoms reported      Cardiovascular Cardiovascular Symptoms Reported: Swelling in legs or feet, Dizziness Does patient have uncontrolled Hypertension?: No Cardiovascular Conditions: Heart failure Cardiovascular Management Strategies: Medication therapy Cardiovascular Self-Management Outcome: 4 (good)  Respiratory Respiratory Symptoms Reported: No symptoms reported    Endocrine Patient reports the following symptoms related to hypoglycemia or hyperglycemia : No symptoms reported Is patient diabetic?: Yes Is patient  checking blood sugars at home?: Yes Endocrine Conditions: Diabetes Endocrine Management Strategies: Medication therapy Endocrine Self-Management Outcome: 2 (bad)  Gastrointestinal   Gastrointestinal Conditions: Abdominal pain, Reflux/heartburn Gastrointestinal Management Strategies: Medication therapy Gastrointestinal Self-Management Outcome: 3 (uncertain)    Genitourinary   Genitourinary Conditions: Frequency, Incontinence Genitourinary Management Strategies: Medication therapy Genitourinary Self-Management Outcome: 3 (uncertain)  Integumentary Integumentary Symptoms Reported: No symptoms reported    Musculoskeletal Musculoskelatal Symptoms Reviewed: No symptoms reported        Psychosocial Psychosocial Symptoms Reported: No symptoms reported            08/31/2023   11:55 AM  Depression screen PHQ 2/9  Decreased Interest 0  Down, Depressed, Hopeless 1  PHQ - 2 Score 1    There were no vitals filed for this visit.  Medications Reviewed Today     Reviewed by Juanell Fairly, RN (Registered Nurse) on 08/31/23 at 1146  Med List Status: <None>   Medication Order Taking? Sig Documenting Provider Last Dose Status Informant  Accu-Chek Softclix Lancets lancets [id number] No Check blood sugar three times a day as instructed  Patient not taking: Reported on 08/31/2023   Steffanie Rainwater, MD Not Taking Active Self  amiodarone (PACERONE) 200 MG tablet [id number] Yes Take 1 tablet (200 mg total) by mouth every morning. Azalee Course, [place] Taking Active   amLODipine (NORVASC) 5 MG tablet [id number] Yes Take 1 tablet (5 mg total) by mouth every evening. Champ Mungo, DO Taking Active   apixaban (ELIQUIS) 5 MG TABS tablet [id number] Yes Take 1 tablet (5 mg total) by mouth 2 (two) times daily. Champ Mungo, DO Taking Active   atorvastatin (LIPITOR) 40 MG tablet [id number] Yes Take 1 tablet (40 mg total) by mouth every evening. Champ Mungo, DO Taking Active   Blood Glucose Monitoring Suppl (ACCU-CHEK  GUIDE) w/Device KIT [id number] No Use as directed to check blood sugar.  Patient not taking: Reported on 08/31/2023   Miguel Aschoff, MD Not Taking Active   Blood Glucose Monitoring Suppl (ACCU-CHEK GUIDE) w/Device KIT [id number] No Use to monitor Blood Sugars 3 times daily with meals  Patient not taking: Reported on 08/31/2023    Not Taking Active   carvedilol (COREG) 25 MG tablet [id number] Yes Take 1 tablet (25 mg total) by mouth 2 (two) times daily with a meal. Champ Mungo, DO Taking Active   cephALEXin (KEFLEX) 500 MG capsule [id number] No Take 1 capsule (500 mg total) by mouth 4 (four) times daily - morning, noon, evening, bedtime - for 7 days  Patient not taking: Reported on 08/31/2023    Not Taking Active   COMFORT EZ PEN NEEDLES 31G X 6 MM MISC [id number]  USE AS DIRECTED AT BEDTIME [name], MD  Active Self  COMFORT EZ PEN NEEDLES 32G X 6 MM MISC [id number] Yes  [provider] Taking Active Self  Continuous Glucose Sensor (DEXCOM G7 SENSOR) MISC [id number] Yes Please use to check glucose and change every 10 days Nooruddin, Saad, MD Taking Active   Dexlansoprazole (DEXILANT) 30 MG capsule DR [id number] Yes Take 1 capsule (  30 mg total) by mouth 2 (two) times daily. Mansouraty, Albino Alu., MD Taking Active   DULoxetine (CYMBALTA) 60 MG capsule [id number] Yes Take 1 capsule (60 mg total) by mouth daily. Malen Scudder, DO Taking Active   empagliflozin (JARDIANCE) 25 MG TABS tablet [id number] Yes Take 1 tablet (25 mg total) by mouth every morning. Malen Scudder, DO Taking Active   ezetimibe (ZETIA) 10 MG tablet [id number] Yes Take 1 tablet (10 mg total) by mouth every evening. Malen Scudder, DO Taking Active   glucose blood (ACCU-CHEK GUIDE TEST) test strip [id number] No Use to check blood sugar up to 4 (four) times daily.  Patient not taking: Reported on 08/31/2023   Sherol Dixie, MD Not Taking Active   glucose blood (ACCU-CHEK GUIDE TEST) test strip [id number] No Use to test Blood  Sugars 3 (three) times per day with meals and as needed  Patient not taking: Reported on 08/31/2023    Not Taking Active   insulin aspart (NOVOLOG FLEXPEN) 100 UNIT/ML FlexPen [id number] Yes Inject 3 times daily with meals with the following scale: 150-200=2units, 201-250=4units, 251-300=6units, 301-350=8units, 351-400=10units, 401-450=12units. Max daily dosage 36units. Call for blood sugars greater than 450.  Taking Active   insulin glargine (LANTUS SOLOSTAR) 100 UNIT/ML Solostar Pen [id number] Yes Inject 60 Units into the skin daily. Nooruddin, Saad, MD Taking Active   Lancets MISC [id number] No Use to test Blood Sugars 3 (three) times per day with meals and as needed  Patient not taking: Reported on 08/31/2023    Not Taking Active   mirabegron ER (MYRBETRIQ) 25 MG TB24 tablet [id number] Yes Take 2 tablets (50 mg total) by mouth 1 hour prior to bedtime daily.  Taking Active   oxybutynin (DITROPAN XL) 15 MG 24 hr tablet [id number] Yes Take 1 tablet (15 mg total) by mouth daily. Malen Scudder, DO Taking Active   oxybutynin (DITROPAN XL) 15 MG 24 hr tablet [id number]  Take one tablet (15 mg dose) by mouth daily.   Active   sacubitril-valsartan (ENTRESTO) 97-103 MG [id number] Yes Take 1 tablet by mouth 2 (two) times daily. Malen Scudder, DO Taking Active   SITagliptin 50 MG TABS [id number] Yes Take 1 tablet by mouth daily. Malen Scudder, DO Taking Active   spironolactone (ALDACTONE) 50 MG tablet [id number] Yes Take 1 tablet (50 mg total) by mouth every morning. Malen Scudder, DO Taking Active   torsemide (DEMADEX) 20 MG tablet [id number] Yes Take 2 tablets (40 mg total) by mouth daily. Malen Scudder, DO Taking Active   Vitamin D, Ergocalciferol, (DRISDOL) 1.25 MG (50000 UNIT) CAPS capsule [id number] No Take 1 capsule (50,000 Units total) by mouth every 7 (seven) days.  Patient not taking: Reported on 08/31/2023   Nooruddin, Saad, MD Not Taking Active   Vitamin D, Ergocalciferol, 50000 units CAPS [id number] No Take 1 capsule  by mouth once a week for 12 weeks  Patient not taking: Reported on 08/31/2023    Not Taking Active             Recommendation:   PCP Follow-up  Follow Up Plan:   Telephone follow-up in 1 month  Augustin Leber RN, BSN, [facility]  [facility], [facility]  Care Coordinator Phone: [phone]

## 2023-08-31 NOTE — Progress Notes (Signed)
 This encounter was created in error - please disregard.

## 2023-08-31 NOTE — Patient Instructions (Signed)
 Visit Information  Thank you for taking time to visit with me today. Please don't hesitate to contact me if I can be of assistance to you before our next scheduled appointment.  Your next care management appointment is by telephone on 10/04/23 at 1130 pm  Telephone follow-up in 1 month  Please call the care guide team at [phone] if you need to cancel, schedule, or reschedule an appointment.   Please call 1-800-273-TALK (toll free, 24 hour hotline) if you are experiencing a Mental Health or Behavioral Health Crisis or need someone to talk to.   Augustin Leber RN, BSN, [facility]  [facility], [facility]  Care Coordinator Phone: [phone]

## 2023-09-01 ENCOUNTER — Other Ambulatory Visit: Payer: Self-pay

## 2023-09-01 ENCOUNTER — Other Ambulatory Visit (HOSPITAL_COMMUNITY): Payer: Self-pay

## 2023-09-02 ENCOUNTER — Other Ambulatory Visit: Payer: Self-pay

## 2023-09-07 ENCOUNTER — Other Ambulatory Visit (HOSPITAL_COMMUNITY): Payer: Self-pay

## 2023-09-08 ENCOUNTER — Other Ambulatory Visit (HOSPITAL_COMMUNITY): Payer: Self-pay

## 2023-09-08 MED ORDER — INSULIN LISPRO (1 UNIT DIAL) 100 UNIT/ML (KWIKPEN)
PEN_INJECTOR | SUBCUTANEOUS | 3 refills | Status: DC
  Filled 2023-09-08: qty 30, 83d supply, fill #0
  Filled 2023-12-27 (×2): qty 30, 83d supply, fill #1

## 2023-09-12 ENCOUNTER — Other Ambulatory Visit (HOSPITAL_COMMUNITY): Payer: Self-pay

## 2023-09-12 ENCOUNTER — Other Ambulatory Visit: Payer: Self-pay | Admitting: Student

## 2023-09-12 ENCOUNTER — Other Ambulatory Visit: Payer: Self-pay

## 2023-09-12 DIAGNOSIS — E1142 Type 2 diabetes mellitus with diabetic polyneuropathy: Secondary | ICD-10-CM

## 2023-09-13 ENCOUNTER — Other Ambulatory Visit (HOSPITAL_COMMUNITY): Payer: Self-pay

## 2023-09-14 ENCOUNTER — Telehealth: Payer: Self-pay | Admitting: Student

## 2023-09-14 ENCOUNTER — Other Ambulatory Visit (HOSPITAL_COMMUNITY): Payer: Self-pay

## 2023-09-14 NOTE — Telephone Encounter (Unsigned)
 Copied from CRM [id number]. Topic: Clinical - Medication Refill >> Sep 14, 2023  4:15 PM Retta Caster wrote: Most Recent Primary Care Visit:  Provider: NOORUDDIN, SAAD  Department: [facility]  Visit Type: OPEN ESTABLISHED  Date: 07/25/2023  Medication: Continuous Glucose Sensor (DEXCOM G7 SENSOR) MISC   Has the patient contacted their pharmacy? Yes (Agent: If no, request that the patient contact the pharmacy for the refill. If patient does not wish to contact the pharmacy document the reason why and proceed with request.) (Agent: If yes, when and what did the pharmacy advise?)  Is this the correct pharmacy for this prescription? Yes If no, delete pharmacy and type the correct one.  This is the patient's preferred pharmacy:    [facility] [address] [place] [address] Phone: [phone] Fax: [phone]   Has the prescription been filled recently? No  Is the patient out of the medication? N/A  Has the patient been seen for an appointment in the last year OR does the patient have an upcoming appointment? Yes  Can we respond through MyChart? Yes  Agent: Please be advised that Rx refills may take up to 3 business days. We ask that you follow-up with your pharmacy.

## 2023-09-15 ENCOUNTER — Other Ambulatory Visit (HOSPITAL_COMMUNITY): Payer: Self-pay

## 2023-09-15 MED ORDER — DEXCOM G6 SENSOR MISC
1.0000 | 3 refills | Status: DC
Start: 2023-09-14 — End: 2024-02-29
  Filled 2023-09-15: qty 3, 30d supply, fill #0

## 2023-09-16 ENCOUNTER — Other Ambulatory Visit (HOSPITAL_COMMUNITY): Payer: Self-pay

## 2023-09-20 ENCOUNTER — Other Ambulatory Visit (HOSPITAL_COMMUNITY): Payer: Self-pay

## 2023-09-21 ENCOUNTER — Other Ambulatory Visit: Payer: Self-pay

## 2023-09-21 ENCOUNTER — Other Ambulatory Visit (HOSPITAL_COMMUNITY): Payer: Self-pay

## 2023-09-22 ENCOUNTER — Other Ambulatory Visit (HOSPITAL_COMMUNITY): Payer: Self-pay

## 2023-09-22 MED ORDER — LANTUS SOLOSTAR 100 UNIT/ML ~~LOC~~ SOPN
PEN_INJECTOR | SUBCUTANEOUS | 3 refills | Status: DC
Start: 2023-09-22 — End: 2024-02-29
  Filled 2023-09-22: qty 54, 90d supply, fill #0
  Filled 2023-12-26: qty 54, 90d supply, fill #1

## 2023-09-23 ENCOUNTER — Other Ambulatory Visit: Payer: Self-pay

## 2023-09-23 ENCOUNTER — Other Ambulatory Visit (HOSPITAL_COMMUNITY): Payer: Self-pay

## 2023-09-26 ENCOUNTER — Other Ambulatory Visit: Payer: Self-pay

## 2023-09-27 ENCOUNTER — Other Ambulatory Visit: Payer: Self-pay

## 2023-09-27 ENCOUNTER — Other Ambulatory Visit (HOSPITAL_COMMUNITY): Payer: Self-pay

## 2023-10-04 ENCOUNTER — Telehealth: Payer: Self-pay

## 2023-10-07 ENCOUNTER — Other Ambulatory Visit: Payer: Self-pay

## 2023-10-18 ENCOUNTER — Telehealth: Payer: Self-pay | Admitting: *Deleted

## 2023-10-18 ENCOUNTER — Other Ambulatory Visit: Payer: Self-pay

## 2023-10-18 NOTE — Progress Notes (Signed)
 Complex Care Management Care Guide Note  10/18/2023 Name: Terry Parks MRN: [id number] DOB: Oct 01, 1955  Terry Parks is a 68 y.o. year old male who is a primary care patient of Nooruddin, Saad, MD and is actively engaged with the care management team. I reached out to Arminda Berth by phone today to assist with re-scheduling  with the RN Case Manager.  Follow up plan: Unsuccessful telephone outreach attempt made. A HIPAA compliant phone message was left for the patient providing contact information and requesting a return call.  Barnie Bora  [facility]  Value-Based Care Institute, [facility] Guide  Direct Dial : [phone]  Fax [phone]

## 2023-10-20 ENCOUNTER — Other Ambulatory Visit: Payer: Self-pay

## 2023-10-21 ENCOUNTER — Other Ambulatory Visit: Payer: Self-pay

## 2023-10-24 NOTE — Progress Notes (Signed)
 Complex Care Management Care Guide Note  10/24/2023 Name: Terry Parks MRN: [id number] DOB: January 25, 1956  Terry Parks is a 68 y.o. year old male who is a primary care patient of Nooruddin, Saad, MD and is actively engaged with the care management team. I reached out to Arminda Berth by phone today to assist with re-scheduling  with the RN Case Manager.  Follow up plan: Telephone appointment with complex care management team member scheduled for:  11/16/23  Barnie Bora  [facility]  Value-Based Care Institute, [facility] Guide  Direct Dial : [phone]  Fax [phone]

## 2023-10-26 ENCOUNTER — Encounter: Admitting: Internal Medicine

## 2023-10-26 ENCOUNTER — Telehealth: Payer: Self-pay

## 2023-10-26 NOTE — Telephone Encounter (Signed)
 Patient was identified as falling into the [place] Measure - Diabetes.   Patient was: Appointment scheduled with primary care provider in the next 30 days.  Pt no showed  appt today 6/11 so was rescheduled

## 2023-10-31 ENCOUNTER — Other Ambulatory Visit: Payer: Self-pay

## 2023-10-31 ENCOUNTER — Other Ambulatory Visit (HOSPITAL_COMMUNITY): Payer: Self-pay

## 2023-11-01 ENCOUNTER — Other Ambulatory Visit (HOSPITAL_COMMUNITY): Payer: Self-pay

## 2023-11-02 ENCOUNTER — Encounter: Admitting: Internal Medicine

## 2023-11-07 ENCOUNTER — Other Ambulatory Visit: Payer: Self-pay

## 2023-11-08 ENCOUNTER — Other Ambulatory Visit: Payer: Self-pay

## 2023-11-16 ENCOUNTER — Other Ambulatory Visit: Payer: Self-pay

## 2023-11-17 LAB — HM DIABETES EYE EXAM

## 2023-11-17 NOTE — Patient Instructions (Signed)
 Visit Information  Thank you for taking time to visit with me today. Please don't hesitate to contact me if I can be of assistance to you before our next scheduled appointment.  Your next care management appointment is by telephone on 12/20/23 at 10 am    Please call the care guide team at [phone] if you need to cancel, schedule, or reschedule an appointment.   Please call 1-800-273-TALK (toll free, 24 hour hotline) go to [facility] [address] [place] [id number]) call 911 if you are experiencing a Mental Health or Behavioral Health Crisis or need someone to talk to.  Wilbert Diver RN, BSN, [facility]  [facility], [facility]    Care Coordinator Phone: [phone]       Diabetes Mellitus and Nutrition, Adult When you have diabetes, or diabetes mellitus, it is very important to have healthy eating habits because your blood sugar (glucose) levels are greatly affected by what you eat and drink. Eating healthy foods in the right amounts, at about the same times every day, can help you: Manage your blood glucose. Lower your risk of heart disease. Improve your blood pressure. Reach or maintain a healthy weight. What can affect my meal plan? Every person with diabetes is different, and each person has different needs for a meal plan. Your health care provider may recommend that you work with a dietitian to make a meal plan that is best for you. Your meal plan may vary depending on factors such as: The calories you need. The medicines you take. Your weight. Your blood glucose, blood pressure, and cholesterol levels. Your activity level. Other health conditions you have, such as heart or kidney disease. How do carbohydrates affect me? Carbohydrates, also called carbs, affect your blood glucose level more than any other type of food. Eating carbs raises the amount of glucose in your blood. It is important to know  how many carbs you can safely have in each meal. This is different for every person. Your dietitian can help you calculate how many carbs you should have at each meal and for each snack. How does alcohol affect me? Alcohol can cause a decrease in blood glucose (hypoglycemia), especially if you use insulin  or take certain diabetes medicines by mouth. Hypoglycemia can be a life-threatening condition. Symptoms of hypoglycemia, such as sleepiness, dizziness, and confusion, are similar to symptoms of having too much alcohol. Do not drink alcohol if: Your health care provider tells you not to drink. You are pregnant, may be pregnant, or are planning to become pregnant. If you drink alcohol: Limit how much you have to: 0-1 drink a day for women. 0-2 drinks a day for men. Know how much alcohol is in your drink. In the [place] one drink equals one 12 oz bottle of beer (355 mL), one 5 oz glass of wine (148 mL), or one 1 oz glass of hard liquor (44 mL). Keep yourself hydrated with water , diet soda, or unsweetened iced tea. Keep in mind that regular soda, juice, and other mixers may contain a lot of sugar and must be counted as carbs. What are tips for following this plan?  Reading food labels Start by checking the serving size on the Nutrition Facts label of packaged foods and drinks. The number of calories and the amount of carbs, fats, and other nutrients listed on the label are based on one serving of the item. Many items contain more than one serving per package. Check  the total grams (g) of carbs in one serving. Check the number of grams of saturated fats and trans fats in one serving. Choose foods that have a low amount or none of these fats. Check the number of milligrams (mg) of salt (sodium) in one serving. Most people should limit total sodium intake to less than 2,300 mg per day. Always check the nutrition information of foods labeled as low-fat or nonfat. These foods may be higher in added  sugar or refined carbs and should be avoided. Talk to your dietitian to identify your daily goals for nutrients listed on the label. Shopping Avoid buying canned, pre-made, or processed foods. These foods tend to be high in fat, sodium, and added sugar. Shop around the outside edge of the grocery store. This is where you will most often find fresh fruits and vegetables, bulk grains, fresh meats, and fresh dairy products. Cooking Use low-heat cooking methods, such as baking, instead of high-heat cooking methods, such as deep frying. Cook using healthy oils, such as olive, canola, or sunflower oil. Avoid cooking with butter, cream, or high-fat meats. Meal planning Eat meals and snacks regularly, preferably at the same times every day. Avoid going long periods of time without eating. Eat foods that are high in fiber, such as fresh fruits, vegetables, beans, and whole grains. Eat 4-6 oz (112-168 g) of lean protein each day, such as lean meat, chicken, fish, eggs, or tofu. One ounce (oz) (28 g) of lean protein is equal to: 1 oz (28 g) of meat, chicken, or fish. 1 egg.  cup (62 g) of tofu. Eat some foods each day that contain healthy fats, such as avocado, nuts, seeds, and fish. What foods should I eat? Fruits Berries. Apples. Oranges. Peaches. Apricots. Plums. Grapes. Mangoes. Papayas. Pomegranates. Kiwi. Cherries. Vegetables Leafy greens, including lettuce, spinach, kale, chard, collard greens, mustard greens, and cabbage. Beets. Cauliflower. Broccoli. Carrots. Green beans. Tomatoes. Peppers. Onions. Cucumbers. Brussels sprouts. Grains Whole grains, such as whole-wheat or whole-grain bread, crackers, tortillas, cereal, and pasta. Unsweetened oatmeal. Quinoa. Brown or wild rice. Meats and other proteins Seafood. Poultry without skin. Lean cuts of poultry and beef. Tofu. Nuts. Seeds. Dairy Low-fat or fat-free dairy products such as milk, yogurt, and cheese. The items listed above may not be a  complete list of foods and beverages you can eat and drink. Contact a dietitian for more information. What foods should I avoid? Fruits Fruits canned with syrup. Vegetables Canned vegetables. Frozen vegetables with butter or cream sauce. Grains Refined white flour and flour products such as bread, pasta, snack foods, and cereals. Avoid all processed foods. Meats and other proteins Fatty cuts of meat. Poultry with skin. Breaded or fried meats. Processed meat. Avoid saturated fats. Dairy Full-fat yogurt, cheese, or milk. Beverages Sweetened drinks, such as soda or iced tea. The items listed above may not be a complete list of foods and beverages you should avoid. Contact a dietitian for more information. Questions to ask a health care provider Do I need to meet with a certified diabetes care and education specialist? Do I need to meet with a dietitian? What number can I call if I have questions? When are the best times to check my blood glucose? Where to find more information: American Diabetes Association: diabetes.org Academy of Nutrition and Dietetics: eatright.[employer] of Diabetes and Digestive and Kidney Diseases: StageSync.si Association of Diabetes Care & Education Specialists: diabeteseducator.org Summary It is important to have healthy eating habits because your blood sugar (  glucose) levels are greatly affected by what you eat and drink. It is important to use alcohol carefully. A healthy meal plan will help you manage your blood glucose and lower your risk of heart disease. Your health care provider may recommend that you work with a dietitian to make a meal plan that is best for you. This information is not intended to replace advice given to you by your health care provider. Make sure you discuss any questions you have with your health care provider. Document Revised: 12/04/2019 Document Reviewed: 12/05/2019 Elsevier Patient Education  2024 [employer].

## 2023-11-17 NOTE — Patient Outreach (Signed)
 Complex Care Management   Visit Note  11/17/2023  Name:  Terry Parks MRN: [id number] DOB: 1956-05-11  Situation: Referral received for Complex Care Management related to Diabetes with Complications and HTN I obtained verbal consent from Patient.  Visit completed with patient  on the phone  Background:   Past Medical History:  Diagnosis Date   Abscessed tooth    top back large cavity no pain or drainage, one on bottom  pt pulled tooth 4-5 months ago, right top large hole in tooth   AKI (acute kidney injury) (HCC)    Allergic rhinitis    Anemia    Anxiety    Asthma    Atrial fibrillation (HCC)    BPH (benign prostatic hypertrophy)    Massive BPH noted on cystoscopy 1/23/ 2012 by Dr. Alline.   Cancer [facility])    prostate cancer 2019   Cardiomyopathy [facility])    CHF (congestive heart failure) (HCC)    Cough 03/30/2012   Depression    Diabetes mellitus 04/08/2008   type 2   Dyspnea    Dysrhythmia 2019   Foley catheter in place 07-05-17 placed   Fracture, orbital (HCC) 2021   Right   GERD (gastroesophageal reflux disease) 2020   Headache(784.0)    hx migraines none recent   History of esophagitis 12/16/2019   Hyperlipemia    Hypertension    Hypertensive cardiopathy 03/01/2006   2-D echocardiogram 02/01/2012 showed moderate LVH, mildly to moderately reduced left ventricular systolic function with an estimated ejection fraction of 40-45%, and diffuse hypokinesis.  A nuclear medicine stress study done 01/31/2012 showed no reversible ischemia, a small mid anterior wall fixed defect/infarct, and ejection fraction 42%.       Neck pain    Nephrolithiasis 05/29/2010   CT scan of abdomen/pelvis on 05/29/2010 showed an obstructing approximate 1-2 mm calculus at the left UVJ, and an approximate 1-2 mm left lower pole renal calculus.   Patient had continuing severe pain , and an elevation of his serum creatinine to a value of 1.75 on 06/06/2010.  Patient underwent cystoscopy on 06/08/2010 by Dr.  Alline, but attempts at retrograde pyelogram and ureteroscopy were unsucc   Numbness 01/08/2018   Obstructive sleep apnea 03/06/2008   Sleep study 03/06/08 showed severe OSA/hypopnea syndrome, with successful CPAP titration to 13 CWP using a medium ResMed Mirage Quattro full face mask with heated humidifier.    Personal history of prostate cancer 10/06/2017   Rash 04/17/2014   Renal calculus 05/29/2010   CT scan of abdomen/pelvis on 05/29/2010 showed an obstructing approximate 1-2 mm calculus at the left UVJ, and an approximate 1-2 mm left lower pole renal calculus.   Patient had continuing severe pain , and an elevation of his serum creatinine to a value of 1.75 on 06/06/2010.  The stone had apparently passed and was not seen on repeat CT 06/08/2010.   Sleep apnea    haven't use cpap in 2 years   Tooth pain 10/29/2017   Uncontrolled type 2 diabetes mellitus with hyperglycemia, with long-term current use of insulin  (HCC) 04/08/2008   GLP1 discontinued at hospital discharge 06/2022; concern about esophagitis? Delayed gastric emptying?  Intolerant to metformin      Urinary straining 11/02/2016    Assessment: Patient Reported Symptoms:  Cognitive Cognitive Status: No symptoms reported, Able to follow simple commands, Alert and oriented to person, place, and time, Normal speech and language skills      Neurological Neurological Review of Symptoms: No symptoms reported  HEENT HEENT Symptoms Reported: No symptoms reported      Cardiovascular Cardiovascular Symptoms Reported: Palpitations Does patient have uncontrolled Hypertension?: No Cardiovascular Management Strategies: Medication therapy  Respiratory Respiratory Symptoms Reported: Wheezing Other Respiratory Symptoms: comes from fluid Respiratory Management Strategies: Medication therapy  Endocrine      Gastrointestinal Gastrointestinal Symptoms Reported: No symptoms reported      Genitourinary Genitourinary Symptoms Reported:  Incontinence Genitourinary Management Strategies: Incontinence garment/pad  Integumentary Integumentary Symptoms Reported: No symptoms reported, Other Other Integumentary Symptoms: dry skin Skin Comment: uses cream or lotions  Musculoskeletal Musculoskelatal Symptoms Reviewed: Difficulty walking, Unsteady gait Musculoskeletal Management Strategies: Medical device Falls in the past year?: No    Psychosocial       Quality of Family Relationships: supportive Do you feel physically threatened by others?: No      11/16/2023   12:08 PM  Depression screen PHQ 2/9  Decreased Interest 1  Down, Depressed, Hopeless 1  PHQ - 2 Score 2  Altered sleeping 3  Tired, decreased energy 3  Change in appetite 0  Feeling bad or failure about yourself  1  Trouble concentrating 3  Moving slowly or fidgety/restless 1  Suicidal thoughts 0  PHQ-9 Score 13    There were no vitals filed for this visit.  Medications Reviewed Today     Reviewed by Weyman Corning, RN (Registered Nurse) on 11/16/23 at 1154  Med List Status: <None>   Medication Order Taking? Sig Documenting Provider Last Dose Status Informant  Accu-Chek Softclix Lancets lancets [id number] Yes Check blood sugar three times a day as instructed Lou Claretta HERO, MD  Active Self  amiodarone  (PACERONE ) 200 MG tablet [id number] Yes Take 1 tablet (200 mg total) by mouth every morning. Meng, Hao, [place]  Active   amLODipine  (NORVASC ) 5 MG tablet [id number] Yes Take 1 tablet (5 mg total) by mouth every evening. Addie Perkins, DO  Active   apixaban  (ELIQUIS ) 5 MG TABS tablet [id number] Yes Take 1 tablet (5 mg total) by mouth 2 (two) times daily. Addie Perkins, DO  Active   atorvastatin  (LIPITOR) 40 MG tablet [id number] Yes Take 1 tablet (40 mg total) by mouth every evening. Addie Perkins, DO  Active   Blood Glucose Monitoring Suppl (ACCU-CHEK GUIDE) w/Device KIT [id number]  Use as directed to check blood sugar.  Patient not taking: Reported on 11/16/2023   Trudy Mliss Dragon, MD  Active   Blood Glucose Monitoring Suppl (ACCU-CHEK GUIDE) w/Device KIT [id number]  Use to monitor Blood Sugars 3 times daily with meals  Patient not taking: Reported on 11/16/2023     Active   carvedilol  (COREG ) 25 MG tablet [id number] Yes Take 1 tablet (25 mg total) by mouth 2 (two) times daily with a meal. Addie Perkins, DO  Active   cephALEXin  (KEFLEX ) 500 MG capsule [id number]  Take 1 capsule (500 mg total) by mouth 4 (four) times daily - morning, noon, evening, bedtime - for 7 days  Patient not taking: Reported on 11/16/2023     Active   COMFORT EZ PEN NEEDLES 31G X 6 MM MISC [id number] Yes USE AS DIRECTED AT BEDTIME Lemon Raisin, MD  Active Self  COMFORT EZ PEN NEEDLES 32G X 6 MM MISC [id number] Yes  [provider]  Active Self  Continuous Glucose Sensor (DEXCOM G6 SENSOR) MISC [id number]  Use one sensor on the skin every 10 days with dexcom reader for diabetes monitoring  Patient not taking: Reported on 11/16/2023     Active   Continuous  Glucose Sensor (DEXCOM G7 SENSOR) MISC [id number] Yes Please use to check glucose and change every 10 days Nooruddin, Saad, MD  Active   Dexlansoprazole  (DEXILANT ) 30 MG capsule DR [id number] Yes Take 1 capsule (30 mg total) by mouth 2 (two) times daily. Mansouraty, Aloha Raddle., MD  Active   DULoxetine  (CYMBALTA ) 60 MG capsule [id number] Yes Take 1 capsule (60 mg total) by mouth daily. Addie Perkins, DO  Active   empagliflozin  (JARDIANCE ) 25 MG TABS tablet [id number] Yes Take 1 tablet (25 mg total) by mouth every morning. Addie Perkins, DO  Active   ezetimibe  (ZETIA ) 10 MG tablet [id number] Yes Take 1 tablet (10 mg total) by mouth every evening. Addie Perkins, DO  Active   glucose blood (ACCU-CHEK GUIDE TEST) test strip [id number] Yes Use to check blood sugar up to 4 (four) times daily. Trudy Mliss Dragon, MD  Active   glucose blood (ACCU-CHEK GUIDE TEST) test strip [id number]  Use to test Blood Sugars 3 (three) times per day with meals and as needed   Patient not taking: Reported on 11/16/2023     Active   insulin  glargine (LANTUS  SOLOSTAR) 100 UNIT/ML Solostar Pen [id number] Yes Inject 60 Units into the skin daily. Nooruddin, Saad, MD  Active   insulin  glargine (LANTUS  SOLOSTAR) 100 UNIT/ML Solostar Pen [id number]  Inject 60 units under the skin once daily.  Patient not taking: Reported on 11/16/2023     Active   insulin  lispro (HUMALOG  KWIKPEN) 100 UNIT/ML KwikPen [id number] Yes Inject the following units 3 times daily with meals with the following scale: 150-200=2units, 201-250=4units, 251-300=6units, 301-350=8units, 351-400=10units, 401-450=12units. Max daily dosage 36units. Call for blood sugars greater than 450   Active   Lancets MISC [id number]  Use to test Blood Sugars 3 (three) times per day with meals and as needed  Patient not taking: Reported on 08/31/2023     Active   mirabegron  ER (MYRBETRIQ ) 25 MG TB24 tablet [id number] Yes Take 2 tablets (50 mg total) by mouth 1 hour prior to bedtime daily.   Active   oxybutynin  (DITROPAN  XL) 15 MG 24 hr tablet [id number] Yes Take 1 tablet (15 mg total) by mouth daily. Addie Perkins, DO  Active   oxybutynin  (DITROPAN  XL) 15 MG 24 hr tablet [id number]  Take one tablet (15 mg dose) by mouth daily.  Patient not taking: Reported on 11/16/2023     Active   sacubitril -valsartan  (ENTRESTO ) 97-103 MG [id number] Yes Take 1 tablet by mouth 2 (two) times daily. Addie Perkins, DO  Active   SITagliptin  50 MG TABS [id number] Yes Take 1 tablet by mouth daily. Addie Perkins, DO  Active   spironolactone  (ALDACTONE ) 50 MG tablet [id number] Yes Take 1 tablet (50 mg total) by mouth every morning. Addie Perkins, DO  Active   torsemide  (DEMADEX ) 20 MG tablet [id number] Yes Take 2 tablets (40 mg total) by mouth daily. Addie Perkins, DO  Active   Vitamin D , Ergocalciferol , (DRISDOL ) 1.25 MG (50000 UNIT) CAPS capsule [id number] Yes Take 1 capsule (50,000 Units total) by mouth every 7 (seven) days. Nooruddin, Saad, MD  Active   Vitamin D ,  Ergocalciferol , 50000 units CAPS [id number]  Take 1 capsule by mouth once a week for 12 weeks  Patient not taking: Reported on 11/16/2023     Active             Recommendation:   PCP Follow-up  Follow Up Plan:   Telephone follow up appointment date/time:  12/20/23  10 am  Wilbert Diver RN,  BSN, [facility]  [facility], [facility]    Care Coordinator Phone: [phone]

## 2023-12-01 ENCOUNTER — Other Ambulatory Visit: Payer: Self-pay

## 2023-12-02 ENCOUNTER — Other Ambulatory Visit: Payer: Self-pay

## 2023-12-08 ENCOUNTER — Other Ambulatory Visit: Payer: Self-pay

## 2023-12-09 ENCOUNTER — Encounter: Admitting: Student

## 2023-12-09 ENCOUNTER — Telehealth: Payer: Self-pay | Admitting: Student

## 2023-12-09 NOTE — Telephone Encounter (Signed)
 Patient was called via telephone this am asking to reschedule his appointment that was no showed this morning.  No answer, but was able to leave detailed message instructing to call our office back to reschedule his appointment.

## 2023-12-20 ENCOUNTER — Telehealth: Payer: Self-pay

## 2023-12-20 NOTE — Patient Outreach (Signed)
 Care Coordination   12/20/2023 Name: Japhet Morgenthaler MRN: [id number] DOB: 1956-05-13   Care Coordination Outreach Attempts:  An unsuccessful outreach was attempted for an appointment today. Attempted x2 to reach patient.  Follow Up Plan:  Additional outreach attempts will be made to complete CCM follow-up visit.   Encounter Outcome:  No Answer. Phone rings but there is no option to leave voicemail.   [name], RN MSN [place]  [facility] RN Care Manager Direct Dial : [phone]  Fax: [phone]

## 2023-12-23 ENCOUNTER — Other Ambulatory Visit: Payer: Self-pay

## 2023-12-26 ENCOUNTER — Other Ambulatory Visit: Payer: Self-pay

## 2023-12-26 NOTE — Patient Outreach (Signed)
 Care Coordination   12/26/2023 Name: Terry Parks MRN: [id number] DOB: Jul 06, 1955   Care Coordination Outreach Attempts: A second unsuccessful outreach was attempted today to complete CCM follow-up visit.   Follow Up Plan:  Additional outreach attempts will be made to complete CCM follow-up visit.   Encounter Outcome:  No Answer. HIPAA compliant voicemail left requesting return call.   [name], RN MSN [place]  [facility] RN Care Manager Direct Dial : [phone]  Fax: [phone]

## 2023-12-27 ENCOUNTER — Other Ambulatory Visit (HOSPITAL_COMMUNITY): Payer: Self-pay

## 2023-12-27 ENCOUNTER — Other Ambulatory Visit: Payer: Self-pay

## 2023-12-28 ENCOUNTER — Other Ambulatory Visit (HOSPITAL_COMMUNITY): Payer: Self-pay

## 2023-12-28 ENCOUNTER — Other Ambulatory Visit: Payer: Self-pay

## 2023-12-28 NOTE — Patient Outreach (Signed)
 Care Coordination   12/28/2023 Name: Terry Parks MRN: [id number] DOB: [birth year]   Care Coordination Outreach Attempts:  A third unsuccessful outreach was attempted today to complete CCM follow-up visit.  Follow Up Plan:  No further outreach attempts will be made at this time. We have been unable to contact the patient to complete follow-up visit. Patient will be disenrolled from CCM.  Encounter Outcome:  No Answer. Voicemail box full, unable to leave message.   [name], RN MSN [place]  [facility] RN Care Manager Direct Dial: [phone]  Fax: [phone]

## 2023-12-29 ENCOUNTER — Other Ambulatory Visit: Payer: Self-pay

## 2024-01-02 ENCOUNTER — Other Ambulatory Visit: Payer: Self-pay

## 2024-01-05 ENCOUNTER — Other Ambulatory Visit (HOSPITAL_COMMUNITY): Payer: Self-pay

## 2024-01-23 ENCOUNTER — Other Ambulatory Visit: Payer: Self-pay

## 2024-01-25 ENCOUNTER — Other Ambulatory Visit (HOSPITAL_COMMUNITY): Payer: Self-pay

## 2024-01-31 ENCOUNTER — Other Ambulatory Visit: Payer: Self-pay

## 2024-01-31 ENCOUNTER — Other Ambulatory Visit (HOSPITAL_COMMUNITY): Payer: Self-pay

## 2024-01-31 MED ORDER — MIRABEGRON ER 25 MG PO TB24
50.0000 mg | ORAL_TABLET | Freq: Every day | ORAL | 1 refills | Status: DC
  Filled 2024-01-31: qty 60, 30d supply, fill #0
  Filled 2024-02-29: qty 60, 30d supply, fill #1

## 2024-02-01 ENCOUNTER — Other Ambulatory Visit: Payer: Self-pay

## 2024-02-02 ENCOUNTER — Other Ambulatory Visit (HOSPITAL_COMMUNITY): Payer: Self-pay

## 2024-02-02 ENCOUNTER — Other Ambulatory Visit: Payer: Self-pay

## 2024-02-08 ENCOUNTER — Other Ambulatory Visit (HOSPITAL_COMMUNITY): Payer: Self-pay

## 2024-02-23 ENCOUNTER — Other Ambulatory Visit (HOSPITAL_COMMUNITY): Payer: Self-pay

## 2024-02-23 ENCOUNTER — Other Ambulatory Visit: Payer: Self-pay | Admitting: Internal Medicine

## 2024-02-23 ENCOUNTER — Other Ambulatory Visit: Payer: Self-pay

## 2024-02-23 MED ORDER — AMLODIPINE BESYLATE 5 MG PO TABS
5.0000 mg | ORAL_TABLET | Freq: Every evening | ORAL | 6 refills | Status: DC
  Filled 2024-02-23 – 2024-02-29 (×2): qty 30, 30d supply, fill #0

## 2024-02-23 MED ORDER — ATORVASTATIN CALCIUM 40 MG PO TABS
40.0000 mg | ORAL_TABLET | Freq: Every evening | ORAL | 6 refills | Status: DC
  Filled 2024-02-23 – 2024-02-29 (×2): qty 30, 30d supply, fill #0

## 2024-02-23 NOTE — Telephone Encounter (Signed)
 Medication sent to pharmacy

## 2024-02-24 ENCOUNTER — Ambulatory Visit: Payer: Self-pay

## 2024-02-24 NOTE — Telephone Encounter (Signed)
 Patient called with concerns for continual incontinence along with periods of confusion and inability to retain information. Patient states both have bene happening for several months. Patient alert and oriented. Scheduled for an acute visit on 02/29/2024 at 8:15 AM which is the soonest appointment. Patient verbalized understanding.  FYI Only or Action Required?: FYI only for provider.  Patient was last seen in primary care on 07/25/2023 by Nooruddin, Saad, MD.  Called Nurse Triage reporting Urinary Incontinence and Altered Mental Status.  Symptoms began several months ago.  Interventions attempted: Rest, hydration, or home remedies.  Symptoms are: unchanged.  Triage Disposition: See PCP Within 2 Weeks  Patient/caregiver understands and will follow disposition?: Yes  Copied from CRM [id number]. Topic: Clinical - Red Word Triage >> Feb 24, 2024  2:04 PM Brittney F wrote: Red Word that prompted transfer to Nurse Triage:   Concern: irritation; uncontrolled incontinence   Symptoms:  Soreness   Bumps   Itching   When did the symptoms start?: 3 months and irritation is worsening   What have you done to aid in the concern ? Have you taken anything to assist with the matter?: Yes   If so, what did you take?: 3 in 1 ointment from the [employer]; anti itch cream; a&d ointment; Vaseline; diaper rash cream   Wanted to let you know I will be transferring you to further discuss your concern. Please be advised the nurse can assist with scheduling. Reason for Disposition  [1] Longstanding confusion (e.g., dementia, stroke) AND [2] NO worsening or change  [1] Can't control passage of urine (i.e., urinary incontinence, wetting self) AND [2] present > 2 weeks  Answer Assessment - Initial Assessment Questions 1. LEVEL OF CONSCIOUSNESS: How are they (the patient) acting right now? (e.g., alert-oriented, confused, lethargic, stuporous, comatose)     Alert and oriented currently but having  moments of confusion and having issues with retaining information.  2. ONSET: When did the confusion start?  (e.g., minutes, hours, days)     Started a month ago 3. PATTERN: Does this come and go, or has it been constant since it started?  Is it present now?     Comes and goes 4. ALCOHOL or DRUGS: Have they been drinking alcohol or taking any drugs?      no 5. NARCOTIC MEDICINES: Have they been receiving any narcotic medications? (e.g., morphine , Vicodin)     no 6. CAUSE: What do you think is causing the confusion?      Increased in stress and having difficulty retaining information 7. OTHER SYMPTOMS: Are there any other symptoms? (e.g., difficulty breathing, fever, headache, weakness)     Fatigue, feeling disoriented.  Answer Assessment - Initial Assessment Questions 1. SYMPTOM: What's the main symptom you're concerned about? (e.g., frequency, incontinence)     Incontinence-chronic  2. ONSET: When did the  urinary incontinence  start?     A couple of years 3. PAIN: Is there any pain? If Yes, ask: How bad is it? (Scale: 1-10; mild, moderate, severe)     no 4. CAUSE: What do you think is causing the symptoms?     Chronic incontinence 5. OTHER SYMPTOMS: Do you have any other symptoms? (e.g., blood in urine, fever, flank pain, pain with urination)     no  Protocols used: Confusion - Delirium-A-AH, Urinary Symptoms-A-AH

## 2024-02-29 ENCOUNTER — Other Ambulatory Visit (HOSPITAL_COMMUNITY): Payer: Self-pay

## 2024-02-29 ENCOUNTER — Encounter: Payer: Self-pay | Admitting: Student

## 2024-02-29 ENCOUNTER — Ambulatory Visit (HOSPITAL_COMMUNITY)
Admission: RE | Admit: 2024-02-29 | Discharge: 2024-02-29 | Disposition: A | Discharge status: Home / Self Care | Source: Ambulatory Visit | Attending: Family Medicine | Admitting: Family Medicine

## 2024-02-29 ENCOUNTER — Ambulatory Visit: Payer: Self-pay | Admitting: Student

## 2024-02-29 ENCOUNTER — Other Ambulatory Visit: Payer: Self-pay

## 2024-02-29 VITALS — BP 180/98 | HR 90 | Temp 97.9°F | Ht 73.0 in | Wt 281.8 lb

## 2024-02-29 DIAGNOSIS — I152 Hypertension secondary to endocrine disorders: Secondary | ICD-10-CM | POA: Diagnosis not present

## 2024-02-29 DIAGNOSIS — I4819 Other persistent atrial fibrillation: Secondary | ICD-10-CM

## 2024-02-29 DIAGNOSIS — I480 Paroxysmal atrial fibrillation: Secondary | ICD-10-CM | POA: Insufficient documentation

## 2024-02-29 DIAGNOSIS — E1165 Type 2 diabetes mellitus with hyperglycemia: Secondary | ICD-10-CM

## 2024-02-29 DIAGNOSIS — Z794 Long term (current) use of insulin: Secondary | ICD-10-CM

## 2024-02-29 DIAGNOSIS — Z79899 Other long term (current) drug therapy: Secondary | ICD-10-CM

## 2024-02-29 DIAGNOSIS — L304 Erythema intertrigo: Secondary | ICD-10-CM

## 2024-02-29 DIAGNOSIS — I5042 Chronic combined systolic (congestive) and diastolic (congestive) heart failure: Secondary | ICD-10-CM

## 2024-02-29 DIAGNOSIS — E1169 Type 2 diabetes mellitus with other specified complication: Secondary | ICD-10-CM

## 2024-02-29 DIAGNOSIS — Z7901 Long term (current) use of anticoagulants: Secondary | ICD-10-CM

## 2024-02-29 DIAGNOSIS — E1142 Type 2 diabetes mellitus with diabetic polyneuropathy: Secondary | ICD-10-CM

## 2024-02-29 DIAGNOSIS — Z91199 Patient's noncompliance with other medical treatment and regimen due to unspecified reason: Secondary | ICD-10-CM

## 2024-02-29 DIAGNOSIS — E1159 Type 2 diabetes mellitus with other circulatory complications: Secondary | ICD-10-CM

## 2024-02-29 DIAGNOSIS — F320 Major depressive disorder, single episode, mild: Secondary | ICD-10-CM

## 2024-02-29 DIAGNOSIS — N39498 Other specified urinary incontinence: Secondary | ICD-10-CM

## 2024-02-29 LAB — POCT GLYCOSYLATED HEMOGLOBIN (HGB A1C): HbA1c POC (<> result, manual entry): >14 % — AB (ref 4.0–5.6)

## 2024-02-29 LAB — GLUCOSE, CAPILLARY: Glucose-Capillary: 407 mg/dL — ABNORMAL HIGH (ref 70–99)

## 2024-02-29 MED ORDER — DEXCOM G7 SENSOR MISC
3 refills | Status: DC
  Filled 2024-02-29: qty 1, 10d supply, fill #0

## 2024-02-29 MED ORDER — COMFORT EZ PEN NEEDLES 31G X 6 MM MISC
3 refills | Status: AC
  Filled 2024-02-29: qty 100, 100d supply, fill #0

## 2024-02-29 MED ORDER — DULOXETINE HCL 60 MG PO CPEP
60.0000 mg | ORAL_CAPSULE | Freq: Every day | ORAL | 3 refills | Status: AC
  Filled 2024-02-29: qty 30, 30d supply, fill #0
  Filled 2024-03-01: qty 90, 90d supply, fill #0
  Filled 2024-03-05: qty 30, 30d supply, fill #0
  Filled 2024-04-02: qty 30, 30d supply, fill #1
  Filled 2024-04-30: qty 30, 30d supply, fill #2
  Filled 2024-06-03: qty 30, 30d supply, fill #3

## 2024-02-29 MED ORDER — ACCU-CHEK GUIDE TEST VI STRP
ORAL_STRIP | Freq: Four times a day (QID) | 12 refills | Status: AC
  Filled 2024-02-29: qty 100, 25d supply, fill #0

## 2024-02-29 MED ORDER — CLOTRIMAZOLE 1 % EX CREA
1.0000 | TOPICAL_CREAM | Freq: Two times a day (BID) | CUTANEOUS | 0 refills | Status: DC
  Filled 2024-02-29: qty 30, 15d supply, fill #0

## 2024-02-29 MED ORDER — ATORVASTATIN CALCIUM 40 MG PO TABS
40.0000 mg | ORAL_TABLET | Freq: Every evening | ORAL | 6 refills | Status: AC
  Filled 2024-02-29 – 2024-03-05 (×3): qty 30, 30d supply, fill #0
  Filled 2024-04-02: qty 30, 30d supply, fill #1
  Filled 2024-04-30: qty 30, 30d supply, fill #2
  Filled 2024-06-03: qty 30, 30d supply, fill #3

## 2024-02-29 MED ORDER — CARVEDILOL 6.25 MG PO TABS
6.2500 mg | ORAL_TABLET | Freq: Two times a day (BID) | ORAL | 11 refills | Status: DC
  Filled 2024-02-29 – 2024-03-05 (×3): qty 60, 30d supply, fill #0
  Filled 2024-04-02: qty 60, 30d supply, fill #1
  Filled 2024-04-30: qty 60, 30d supply, fill #2
  Filled 2024-06-03: qty 60, 30d supply, fill #3

## 2024-02-29 MED ORDER — OXYBUTYNIN CHLORIDE ER 15 MG PO TB24
15.0000 mg | ORAL_TABLET | Freq: Every day | ORAL | 3 refills | Status: AC
  Filled 2024-02-29: qty 30, 30d supply, fill #0
  Filled 2024-03-01: qty 90, 90d supply, fill #0
  Filled 2024-03-05: qty 30, 30d supply, fill #0
  Filled 2024-04-02: qty 30, 30d supply, fill #1
  Filled 2024-04-30: qty 30, 30d supply, fill #2
  Filled 2024-06-03: qty 30, 30d supply, fill #3

## 2024-02-29 MED ORDER — MIRABEGRON ER 25 MG PO TB24
50.0000 mg | ORAL_TABLET | Freq: Every day | ORAL | 1 refills | Status: AC
  Filled 2024-02-29: qty 60, 30d supply, fill #0
  Filled 2024-03-01: qty 180, 90d supply, fill #0
  Filled 2024-03-05: qty 60, 30d supply, fill #0
  Filled 2024-04-02: qty 60, 30d supply, fill #1
  Filled 2024-04-30: qty 60, 30d supply, fill #2
  Filled 2024-06-03: qty 60, 30d supply, fill #3

## 2024-02-29 MED ORDER — EZETIMIBE 10 MG PO TABS
10.0000 mg | ORAL_TABLET | Freq: Every evening | ORAL | 2 refills | Status: AC
  Filled 2024-02-29: qty 30, 30d supply, fill #0
  Filled 2024-03-01: qty 90, 90d supply, fill #0
  Filled 2024-03-05: qty 30, 30d supply, fill #0
  Filled 2024-04-02: qty 30, 30d supply, fill #1
  Filled 2024-04-30: qty 30, 30d supply, fill #2
  Filled 2024-06-03: qty 30, 30d supply, fill #3

## 2024-02-29 MED ORDER — TORSEMIDE 20 MG PO TABS
ORAL_TABLET | ORAL | 2 refills | Status: DC
  Filled 2024-02-29: qty 30, 30d supply, fill #0

## 2024-02-29 MED ORDER — LANTUS SOLOSTAR 100 UNIT/ML ~~LOC~~ SOPN
40.0000 [IU] | PEN_INJECTOR | Freq: Every day | SUBCUTANEOUS | 11 refills | Status: DC
  Filled 2024-02-29: qty 15, 37d supply, fill #0

## 2024-02-29 MED ORDER — APIXABAN 5 MG PO TABS
5.0000 mg | ORAL_TABLET | Freq: Two times a day (BID) | ORAL | 11 refills | Status: AC
  Filled 2024-02-29 – 2024-03-05 (×3): qty 60, 30d supply, fill #0
  Filled 2024-04-02: qty 60, 30d supply, fill #1
  Filled 2024-04-30: qty 60, 30d supply, fill #2
  Filled 2024-06-03: qty 60, 30d supply, fill #3

## 2024-02-29 MED ORDER — LANCETS MISC
3 refills | Status: AC
Start: 2024-02-29 — End: ?
  Filled 2024-02-29: qty 400, 25d supply, fill #0

## 2024-02-29 NOTE — Assessment & Plan Note (Signed)
 Patient has not been taking his Cymbalta . Refilled his Cymbalta  today.

## 2024-02-29 NOTE — Assessment & Plan Note (Addendum)
 Patient has a past medical history of type 2 diabetes mellitus.  He has been prescribed multiple medications including Jardiance , Lantus , and mealtime insulin .  He states that he has not been taking his medications.  When asked, he states that he feels that the medicines do not help, so he stops taking them.  He has been off his medications for a few months now.  He denies any nausea or vomiting.  He denies any abdominal pain.  He does not check his blood pressures or blood sugars at home.  He has not had an eye exam in quite some time.  A1c today is greater than 14.  Anticipate this is in the setting of medication nonadherence.  Long conversation with patient today regarding medication adherence.  He agreed to start taking medications.  Plan: - Start Lantus  40 units daily - Follow-up in 1 week - Sent in supplies for measuring blood sugars - Encourage patient to get eye exam - Discontinue Jardiance  in setting of groin infection - Titrate insulin  at next visit based off blood sugar levels - Foot exam updated, showing evidence of neuropathy

## 2024-02-29 NOTE — Patient Instructions (Addendum)
 Terry Parks, [place] you for allowing me to take part in your care today.  Here are your instructions.  1.  I have written you a medication list.  For now, please take these medications  Please take Eliquis  5 mg twice daily Please take Lantus  40 units daily Please take Lipitor 40 mg daily Please take Zetia  10 mg daily Please take Cymbalta  60 mg daily Please take Myrbetriq  50 mg Please take oxybutynin  15 mg daily Please take carvedilol  6.25 mg twice daily  I am checking your kidney function, and I will tell you about the rest of your medications once your kidney function returns.  2.  I have stopped your Entresto , spironolactone , stop your amiodarone .  Depending on your kidney function outside which medicines you can start taking again.  3.  I have written a letter for accommodation  5.  I have sent you a cream for your infection in your groin. Please keep the area clean and if you can, keep a cream on the area to avoid further infection   6. Please come back in 1 week  PLEASE BRING YOUR MEDICATIONS TO EVERY APPOINTMENT  Thank you, Dr. Tobie  If you have any other questions please contact the internal medicine clinic at [phone] If it is after hours, please call the [facility] at [phone] and then ask the person who picks up for the resident on call.

## 2024-02-29 NOTE — Assessment & Plan Note (Signed)
 Patient has a past medical history of hyperlipidemia.  Most recent lipid panel showed total cholesterol 133, LDL 64, triglyceride 285 in May 2024.  He has not been taking his Lipitor 40 mg daily.  Encouraged him to restart.  Plan: - Encourage patient restart atorvastatin  40 mg daily - Follow-up lipid panel

## 2024-02-29 NOTE — Progress Notes (Signed)
 CC: Follow-up visit  HPI:  [name] is a 68 y.o. male with past medical history of combined heart failure, hypertension, A-fib, OSA, CKD stage III, type 2 diabetes who presents for follow up visit.  Please see assessment and plan for full HPI.  Medications: HFrEF: Coreg  6.25 mg twice daily A-fib:  Eliquis  5 mg twice daily Diabetes: Lantus  40 units daily Hyperlipidemia: Lipitor 40 mg daily, Zetia  10 mg daily GERD: Dexlansoprazole  30 mg twice daily  Depression: Cymbalta  60 mg daily Incontinence: Myrbetriq  50 mg nightly, oxybutynin  15 mg daily  Patient has been nonadherent to all his medications as he felt as if they were not making him feel better.  Had long conversation with patient today regarding medication adherence.  Patient appreciated this conversation.  Past Medical History:  Diagnosis Date   Abscessed tooth    top back large cavity no pain or drainage, one on bottom  pt pulled tooth 4-5 months ago, right top large hole in tooth   AKI (acute kidney injury)    Allergic rhinitis    Anemia    Anxiety    Asthma    Atrial fibrillation (HCC)    BPH (benign prostatic hypertrophy)    Massive BPH noted on cystoscopy 1/23/ 2012 by Dr. Alline.   Cancer [facility])    prostate cancer 2019   Cardiomyopathy [facility])    CHF (congestive heart failure) (HCC)    Cough 03/30/2012   Depression    Diabetes mellitus 04/08/2008   type 2   Dyspnea    Dysrhythmia 2019   Foley catheter in place 07-05-17 placed   Fracture, orbital (HCC) 2021   Right   GERD (gastroesophageal reflux disease) 2020   Headache(784.0)    hx migraines none recent   History of esophagitis 12/16/2019   Hyperlipemia    Hypertension    Hypertensive cardiopathy 03/01/2006   2-D echocardiogram 02/01/2012 showed moderate LVH, mildly to moderately reduced left ventricular systolic function with an estimated ejection fraction of 40-45%, and diffuse hypokinesis.  A nuclear medicine stress study done 01/31/2012 showed  no reversible ischemia, a small mid anterior wall fixed defect/infarct, and ejection fraction 42%.       Neck pain    Nephrolithiasis 05/29/2010   CT scan of abdomen/pelvis on 05/29/2010 showed an obstructing approximate 1-2 mm calculus at the left UVJ, and an approximate 1-2 mm left lower pole renal calculus.   Patient had continuing severe pain , and an elevation of his serum creatinine to a value of 1.75 on 06/06/2010.  Patient underwent cystoscopy on 06/08/2010 by Dr. Alline, but attempts at retrograde pyelogram and ureteroscopy were unsucc   Numbness 01/08/2018   Obstructive sleep apnea 03/06/2008   Sleep study 03/06/08 showed severe OSA/hypopnea syndrome, with successful CPAP titration to 13 CWP using a medium ResMed Mirage Quattro full face mask with heated humidifier.    Personal history of prostate cancer 10/06/2017   Rash 04/17/2014   Renal calculus 05/29/2010   CT scan of abdomen/pelvis on 05/29/2010 showed an obstructing approximate 1-2 mm calculus at the left UVJ, and an approximate 1-2 mm left lower pole renal calculus.   Patient had continuing severe pain , and an elevation of his serum creatinine to a value of 1.75 on 06/06/2010.  The stone had apparently passed and was not seen on repeat CT 06/08/2010.   Sleep apnea    haven't use cpap in 2 years   Tooth pain 10/29/2017   Uncontrolled type 2 diabetes mellitus with hyperglycemia,  with long-term current use of insulin  (HCC) 04/08/2008   GLP1 discontinued at hospital discharge 06/2022; concern about esophagitis? Delayed gastric emptying?  Intolerant to metformin      Urinary straining 11/02/2016     Current Outpatient Medications:    clotrimazole (CLOTRIMAZOLE ANTI-FUNGAL) 1 % cream, Apply 1 Application topically 2 (two) times daily. Please Mail to the patient, Disp: 30 g, Rfl: 0   Accu-Chek Softclix Lancets lancets, Check blood sugar three times a day as instructed, Disp: 300 each, Rfl: 11   apixaban  (ELIQUIS ) 5 MG TABS tablet, Take 1  tablet (5 mg total) by mouth 2 (two) times daily., Disp: 60 tablet, Rfl: 11   atorvastatin  (LIPITOR) 40 MG tablet, Take 1 tablet (40 mg total) by mouth every evening., Disp: 30 tablet, Rfl: 6   carvedilol  (COREG ) 6.25 MG tablet, Take 1 tablet (6.25 mg total) by mouth 2 (two) times daily with a meal., Disp: 60 tablet, Rfl: 11   Continuous Glucose Sensor (DEXCOM G7 SENSOR) MISC, Please use to check glucose and change every 10 days, Disp: 1 each, Rfl: 3   Dexlansoprazole  (DEXILANT ) 30 MG capsule DR, Take 1 capsule (30 mg total) by mouth 2 (two) times daily., Disp: 60 capsule, Rfl: 12   DULoxetine  (CYMBALTA ) 60 MG capsule, Take 1 capsule (60 mg total) by mouth daily. Please mail, we are changing all of his medications as of 02/29/2024, Disp: 90 capsule, Rfl: 3   ezetimibe  (ZETIA ) 10 MG tablet, Take 1 tablet (10 mg total) by mouth every evening., Disp: 90 tablet, Rfl: 2   glucose blood (ACCU-CHEK GUIDE TEST) test strip, Use to check blood sugar up to 4 (four) times daily., Disp: 100 each, Rfl: 12   insulin  glargine (LANTUS  SOLOSTAR) 100 UNIT/ML Solostar Pen, Inject 40 Units into the skin daily., Disp: 15 mL, Rfl: 11   Insulin  Pen Needle (COMFORT EZ PEN NEEDLES) 31G X 6 MM MISC, Please use 1 needle to inject your insulin . Never use the same needle twice, Disp: 100 each, Rfl: 3   Lancets MISC, Use to test Blood Sugars 3 (three) times per day with meals and as needed, Disp: 360 each, Rfl: 3   mirabegron  ER (MYRBETRIQ ) 25 MG TB24 tablet, Take 2 tablets (50 mg total) by mouth 1 hour prior to bedtime daily., Disp: 180 tablet, Rfl: 1   oxybutynin  (DITROPAN  XL) 15 MG 24 hr tablet, Take one tablet (15 mg dose) by mouth daily., Disp: 90 tablet, Rfl: 3   sacubitril -valsartan  (ENTRESTO ) 97-103 MG, Take 1 tablet by mouth 2 (two) times daily., Disp: 180 tablet, Rfl: 6   spironolactone  (ALDACTONE ) 50 MG tablet, Take 1 tablet (50 mg total) by mouth every morning., Disp: 30 tablet, Rfl: 6   torsemide  (DEMADEX ) 20 MG  tablet, Please take 1 tablet if you gain more than 3 pounds in 24 hours or more than 5 pounds in 1 week, Disp: 30 tablet, Rfl: 2  Review of Systems:    Respiratory: Patient denies any shortness of breath Cardiovascular: Patient denies any chest pain Skin: Patient endorses rash to groin  Physical Exam:  Vitals:   02/29/24 0821 02/29/24 0906  BP: (!) 160/91 (!) 180/98  Pulse: 91 90  Temp: 97.9 F (36.6 C)   TempSrc: Oral   SpO2: 97%   Weight: 281 lb 12.8 oz (127.8 kg)   Height: 6' 1 (1.854 m)     General: Patient is sitting comfortably in the room  Head: Normocephalic, atraumatic  Mouth: Poor dentition noted Neck: No JVD  Cardio: Irregular rhythm, no murmurs, rubs, or gallop Pulmonary: Clear to ausculation bilaterally with no rales, rhonchi, and crackles  GU: Groin exam showing fungal infection noted to bilateral groin, no balanitis appreciated Extremities: Decreased sensation on plantar surface of lower extremity bilaterally on monofilament sensation, dry skin noted to bilateral lower extremities, no obvious ulcerations appreciated.  2+ pedal pulses.   Assessment & Plan:   Assessment & Plan Uncontrolled type 2 diabetes mellitus with hyperglycemia, with long-term current use of insulin  (HCC) Patient has a past medical history of type 2 diabetes mellitus.  He has been prescribed multiple medications including Jardiance , Lantus , and mealtime insulin .  He states that he has not been taking his medications.  When asked, he states that he feels that the medicines do not help, so he stops taking them.  He has been off his medications for a few months now.  He denies any nausea or vomiting.  He denies any abdominal pain.  He does not check his blood pressures or blood sugars at home.  He has not had an eye exam in quite some time.  A1c today is greater than 14.  Anticipate this is in the setting of medication nonadherence.  Long conversation with patient today regarding medication  adherence.  He agreed to start taking medications.  Plan: - Start Lantus  40 units daily - Follow-up in 1 week - Sent in supplies for measuring blood sugars - Encourage patient to get eye exam - Discontinue Jardiance  in setting of groin infection - Titrate insulin  at next visit based off blood sugar levels - Foot exam updated, showing evidence of neuropathy Chronic combined systolic and diastolic CHF (congestive heart failure) (HCC) Patient with past medical history of nonischemic cardiomyopathy.  Most recent echocardiogram in 2020 showed EF of 35-40%.  He has been nonadherent to his GDMT.  His GDMT did include spironolactone , Entresto , Jardiance  and carvedilol .  He is also supposed to be taking torsemide  daily.  He has had no shortness of breath or chest pain.  No obvious signs of volume overload on my exam today.  Fortunately no concern for exacerbation.  Has not had kidney function checked in quite some time.  Given that he has not been taking his medications, we will be cautious in adding back his medications.  Plan: - Decrease carvedilol  to 6.25 mg twice daily - Stop spironolactone  and Entresto  at this time until renal function results - Regarding renal function, if stable, can restart half dose of spironolactone  and Entresto  - Encourage patient to reach out to his cardiologist today to get plugged back in - Stop Jardiance  in the setting of current infection - Torsemide  changed to as needed as he has not needed it - Discussed entire plan with [name] who will package and mail his medications. Paroxysmal atrial fibrillation [facility]) Patient with a past medical history of paroxysmal atrial fibrillation.  Did have irregular heartbeat today.  Did get EKG.  Did show some PACs, but patient is in sinus rhythm.  Will hold amiodarone  today given that he has not been adherent to this medication.  He is not in RVR.  Will recommend continue his Eliquis   Plan: - Stop amiodarone  as patient  has not been adherent - Continue Eliquis  5 mg twice daily - Patient is on Coreg  6.25 mg twice daily, can help with rate control Hypertension associated with diabetes [facility]) Patient has a past medical history of hypertension.  His current medications include carvedilol , spironolactone , Entresto , amlodipine .  He however has  been not adherent.  Blood pressure today at 160/91.  Repeat blood pressure 180/98.  He denies any chest pain, shortness of breath, or vision changes.  Likely blood pressure elevated in setting medication nonadherence.  Will evaluate kidney function prior to restarting any medications that will affect the kidneys.  Plan: - Stop amlodipine   - Start back low dose Co-reg 6.25 BID - hold Spironolactone  and Entresto  dosing until BMP results  - Follow up BMP - Blood pressure log given to patient  - Return in one week Other urinary incontinence Patient has had a history of prostate cancer requiring surgery.  He has since had urinary continence.  Has incontinence supplies.  Requesting supplies today.  Plan: - Ambulatory referral for DME for incontinence supplies Nonadherence to medical treatment Patient has been nonadherent to his medications.  Requested him to become more adherent.  Will do value-based care referral for polypharmacy.  Plan: -Patient to meet with Pharmacy  Intertrigo Patient concerned of a rash. On exam he has intertrigo. Will treat.  Plan: - Stop Jardiance   - Start Clotrimazole cream  Hyperlipidemia associated with type 2 diabetes mellitus (HCC) Patient has a past medical history of hyperlipidemia.  Most recent lipid panel showed total cholesterol 133, LDL 64, triglyceride 285 in May 2024.  He has not been taking his Lipitor 40 mg daily.  Encouraged him to restart.  Plan: - Encourage patient restart atorvastatin  40 mg daily - Follow-up lipid panel Current mild episode of major depressive disorder without prior episode Patient has not been taking his  Cymbalta . Refilled his Cymbalta  today.    Patient discussed with Dr. Francesco Libby Blanch, DO Internal Medicine Resident PGY-3

## 2024-02-29 NOTE — Assessment & Plan Note (Addendum)
 Patient has a past medical history of hypertension.  His current medications include carvedilol , spironolactone , Entresto , amlodipine .  He however has been not adherent.  Blood pressure today at 160/91.  Repeat blood pressure 180/98.  He denies any chest pain, shortness of breath, or vision changes.  Likely blood pressure elevated in setting medication nonadherence.  Will evaluate kidney function prior to restarting any medications that will affect the kidneys.  Plan: - Stop amlodipine   - Start back low dose Co-reg 6.25 BID - hold Spironolactone  and Entresto  dosing until BMP results  - Follow up BMP - Blood pressure log given to patient  - Return in one week

## 2024-02-29 NOTE — Assessment & Plan Note (Addendum)
 Patient with past medical history of nonischemic cardiomyopathy.  Most recent echocardiogram in 2020 showed EF of 35-40%.  He has been nonadherent to his GDMT.  His GDMT did include spironolactone , Entresto , Jardiance  and carvedilol .  He is also supposed to be taking torsemide  daily.  He has had no shortness of breath or chest pain.  No obvious signs of volume overload on my exam today.  Fortunately no concern for exacerbation.  Has not had kidney function checked in quite some time.  Given that he has not been taking his medications, we will be cautious in adding back his medications.  Plan: - Decrease carvedilol  to 6.25 mg twice daily - Stop spironolactone  and Entresto  at this time until renal function results - Regarding renal function, if stable, can restart half dose of spironolactone  and Entresto  - Encourage patient to reach out to his cardiologist today to get plugged back in - Stop Jardiance  in the setting of current infection - Torsemide  changed to as needed as he has not needed it - Discussed entire plan with [name] who will package and mail his medications.

## 2024-03-01 ENCOUNTER — Other Ambulatory Visit: Payer: Self-pay | Admitting: Student

## 2024-03-01 ENCOUNTER — Other Ambulatory Visit (HOSPITAL_COMMUNITY): Payer: Self-pay

## 2024-03-01 ENCOUNTER — Ambulatory Visit: Payer: Self-pay | Admitting: Student

## 2024-03-01 ENCOUNTER — Other Ambulatory Visit: Payer: Self-pay

## 2024-03-01 LAB — COMPREHENSIVE METABOLIC PANEL WITH GFR
ALT: 10 IU/L (ref 0–44)
AST: 12 IU/L (ref 0–40)
Albumin: 3.3 g/dL — ABNORMAL LOW (ref 3.9–4.9)
Alkaline Phosphatase: 114 IU/L (ref 47–123)
BUN/Creatinine Ratio: 10 (ref 10–24)
BUN: 13 mg/dL (ref 8–27)
Bilirubin Total: 0.3 mg/dL (ref 0.0–1.2)
CO2: 20 mmol/L (ref 20–29)
Calcium: 8.5 mg/dL — ABNORMAL LOW (ref 8.6–10.2)
Chloride: 101 mmol/L (ref 96–106)
Creatinine, Ser: 1.33 mg/dL — ABNORMAL HIGH (ref 0.76–1.27)
Globulin, Total: 3.3 g/dL (ref 1.5–4.5)
Glucose: 387 mg/dL — ABNORMAL HIGH (ref 70–99)
Potassium: 4.1 mmol/L (ref 3.5–5.2)
Sodium: 136 mmol/L (ref 134–144)
Total Protein: 6.6 g/dL (ref 6.0–8.5)
eGFR: 59 mL/min/1.73 — ABNORMAL LOW (ref >59)

## 2024-03-01 MED ORDER — SPIRONOLACTONE 25 MG PO TABS
25.0000 mg | ORAL_TABLET | Freq: Every day | ORAL | 11 refills | Status: AC
  Filled 2024-03-01 – 2024-03-05 (×3): qty 30, 30d supply, fill #0
  Filled 2024-03-30: qty 30, 30d supply, fill #1
  Filled 2024-04-30: qty 30, 30d supply, fill #2
  Filled 2024-06-03: qty 30, 30d supply, fill #3

## 2024-03-01 MED ORDER — SACUBITRIL-VALSARTAN 49-51 MG PO TABS
1.0000 | ORAL_TABLET | Freq: Two times a day (BID) | ORAL | 11 refills | Status: AC
  Filled 2024-03-01 – 2024-03-05 (×3): qty 60, 30d supply, fill #0
  Filled 2024-04-02: qty 60, 30d supply, fill #1
  Filled 2024-04-30: qty 60, 30d supply, fill #2
  Filled 2024-06-03: qty 60, 30d supply, fill #3

## 2024-03-01 NOTE — Progress Notes (Signed)
 Internal Medicine Clinic Attending  Case discussed with the resident at the time of the visit.  We reviewed the resident's history and exam and pertinent patient test results.  I agree with the assessment, diagnosis, and plan of care documented in the resident's note.

## 2024-03-02 ENCOUNTER — Other Ambulatory Visit: Payer: Self-pay

## 2024-03-02 ENCOUNTER — Other Ambulatory Visit (HOSPITAL_COMMUNITY): Payer: Self-pay

## 2024-03-05 ENCOUNTER — Other Ambulatory Visit: Payer: Self-pay

## 2024-03-05 ENCOUNTER — Other Ambulatory Visit (HOSPITAL_COMMUNITY): Payer: Self-pay

## 2024-03-05 ENCOUNTER — Telehealth: Payer: Self-pay | Admitting: *Deleted

## 2024-03-05 NOTE — Progress Notes (Signed)
 Care Guide Pharmacy Note  03/05/2024 Name: Terry Parks MRN: [id number] DOB: 14-Nov-1955  Referred By: Nooruddin, Saad, MD Reason for referral: Complex Care Management (Initial outreach to schedule referral with PharmD )   Terry Parks is a 68 y.o. year old male who is a primary care patient of Nooruddin, Saad, MD.  Terry Parks was referred to the pharmacist for assistance related to: HTN, HLD, and HF  Successful contact was made with the patient to discuss pharmacy services including being ready for the pharmacist to call at least 5 minutes before the scheduled appointment time and to have medication bottles and any blood pressure readings ready for review. The patient agreed to meet with the pharmacist via telephone visit on (date/time). 10:30 AM 03/15/24  [facility]  [facility], [facility] Guide  Direct Dial : [phone]  Fax [phone]

## 2024-03-06 ENCOUNTER — Other Ambulatory Visit: Payer: Self-pay

## 2024-03-07 ENCOUNTER — Other Ambulatory Visit: Payer: Self-pay

## 2024-03-07 ENCOUNTER — Other Ambulatory Visit (HOSPITAL_COMMUNITY): Payer: Self-pay

## 2024-03-07 ENCOUNTER — Ambulatory Visit (INDEPENDENT_AMBULATORY_CARE_PROVIDER_SITE_OTHER): Admitting: Student

## 2024-03-07 VITALS — BP 162/101 | HR 94 | Temp 98.2°F | Ht 73.0 in | Wt 275.2 lb

## 2024-03-07 DIAGNOSIS — Z79899 Other long term (current) drug therapy: Secondary | ICD-10-CM | POA: Diagnosis not present

## 2024-03-07 DIAGNOSIS — I5042 Chronic combined systolic (congestive) and diastolic (congestive) heart failure: Secondary | ICD-10-CM | POA: Diagnosis not present

## 2024-03-07 DIAGNOSIS — E1165 Type 2 diabetes mellitus with hyperglycemia: Secondary | ICD-10-CM

## 2024-03-07 DIAGNOSIS — Z794 Long term (current) use of insulin: Secondary | ICD-10-CM

## 2024-03-07 DIAGNOSIS — J069 Acute upper respiratory infection, unspecified: Secondary | ICD-10-CM

## 2024-03-07 NOTE — Patient Instructions (Addendum)
 Thank you so much for coming to the clinic today!   I will let you know results of the swab  I want to see you back in about two weeks once you get your medications so we can see how you're doing.  Let us  know if you have any problems getting your medications.    If you have any questions please feel free to the call the clinic at anytime at [phone]. It was a pleasure seeing you!  Best, Dr. Allan Bacigalupi

## 2024-03-08 DIAGNOSIS — J069 Acute upper respiratory infection, unspecified: Secondary | ICD-10-CM | POA: Insufficient documentation

## 2024-03-08 DIAGNOSIS — Z79899 Other long term (current) drug therapy: Secondary | ICD-10-CM | POA: Insufficient documentation

## 2024-03-08 NOTE — Assessment & Plan Note (Addendum)
 Previously on Lantus  insulin  at 40 units daily, he has not been taking insulin  due to not having received it yet. His A1c is very high, prompting a change in his diabetes, he will be followed up closely once he gets his insulin  and starts taking it daily.

## 2024-03-08 NOTE — Assessment & Plan Note (Signed)
 He has a cough that started on Tuesday after sleeping under a fan. The cough is persistent, with occasional phlegm production, and causes chest discomfort. No sore throat, but the cough worsens with movement. He has been using home remedies like lemon and honey to manage the symptoms. Likely viral in nature, given the season and symptoms. - Perform swab for COVID, flu, and RSV. - Recommend Mucinex 

## 2024-03-08 NOTE — Assessment & Plan Note (Signed)
 Started on Entresto  and spironolactone  at half doses last week but has not been taking them due to the delay in receiving medications. Previously non-adherent to his guideline-directed medical therapy (GDMT).  His regimen should be as follows:   Carvedilol  6.25 mg twice a day Spironolactone  25 mg daily Entresto  49-51 BID

## 2024-03-08 NOTE — Progress Notes (Signed)
 CC: One week follow up   HPI:  [name] is a 68 y.o. male living with a history stated below and presents today for one week follow up for medication management. Please see problem based assessment and plan for additional details.  Discussed the use of AI scribe software for clinical note transcription with the patient, who gave verbal consent to proceed.     Past Medical History:  Diagnosis Date   Abscessed tooth    top back large cavity no pain or drainage, one on bottom  pt pulled tooth 4-5 months ago, right top large hole in tooth   AKI (acute kidney injury)    Allergic rhinitis    Anemia    Anxiety    Asthma    Atrial fibrillation (HCC)    BPH (benign prostatic hypertrophy)    Massive BPH noted on cystoscopy 1/23/ 2012 by Dr. Alline.   Cancer [facility])    prostate cancer 2019   Cardiomyopathy [facility])    CHF (congestive heart failure) (HCC)    Cough 03/30/2012   Depression    Diabetes mellitus 04/08/2008   type 2   Dyspnea    Dysrhythmia 2019   Foley catheter in place 07-05-17 placed   Fracture, orbital (HCC) 2021   Right   GERD (gastroesophageal reflux disease) 2020   Headache(784.0)    hx migraines none recent   History of esophagitis 12/16/2019   Hyperlipemia    Hypertension    Hypertensive cardiopathy 03/01/2006   2-D echocardiogram 02/01/2012 showed moderate LVH, mildly to moderately reduced left ventricular systolic function with an estimated ejection fraction of 40-45%, and diffuse hypokinesis.  A nuclear medicine stress study done 01/31/2012 showed no reversible ischemia, a small mid anterior wall fixed defect/infarct, and ejection fraction 42%.       Neck pain    Nephrolithiasis 05/29/2010   CT scan of abdomen/pelvis on 05/29/2010 showed an obstructing approximate 1-2 mm calculus at the left UVJ, and an approximate 1-2 mm left lower pole renal calculus.   Patient had continuing severe pain , and an elevation of his serum creatinine to a value of 1.75 on  06/06/2010.  Patient underwent cystoscopy on 06/08/2010 by Dr. Alline, but attempts at retrograde pyelogram and ureteroscopy were unsucc   Numbness 01/08/2018   Obstructive sleep apnea 03/06/2008   Sleep study 03/06/08 showed severe OSA/hypopnea syndrome, with successful CPAP titration to 13 CWP using a medium ResMed Mirage Quattro full face mask with heated humidifier.    Personal history of prostate cancer 10/06/2017   Rash 04/17/2014   Renal calculus 05/29/2010   CT scan of abdomen/pelvis on 05/29/2010 showed an obstructing approximate 1-2 mm calculus at the left UVJ, and an approximate 1-2 mm left lower pole renal calculus.   Patient had continuing severe pain , and an elevation of his serum creatinine to a value of 1.75 on 06/06/2010.  The stone had apparently passed and was not seen on repeat CT 06/08/2010.   Sleep apnea    haven't use cpap in 2 years   Tooth pain 10/29/2017   Uncontrolled type 2 diabetes mellitus with hyperglycemia, with long-term current use of insulin  (HCC) 04/08/2008   GLP1 discontinued at hospital discharge 06/2022; concern about esophagitis? Delayed gastric emptying?  Intolerant to metformin      Urinary straining 11/02/2016    Current Outpatient Medications on File Prior to Visit  Medication Sig Dispense Refill   Accu-Chek Softclix Lancets lancets Check blood sugar three times a day as instructed  300 each 11   apixaban  (ELIQUIS ) 5 MG TABS tablet Take 1 tablet (5 mg total) by mouth 2 (two) times daily. 60 tablet 11   atorvastatin  (LIPITOR) 40 MG tablet Take 1 tablet (40 mg total) by mouth every evening. 30 tablet 6   carvedilol  (COREG ) 6.25 MG tablet Take 1 tablet (6.25 mg total) by mouth 2 (two) times daily with a meal. 60 tablet 11   clotrimazole (CLOTRIMAZOLE ANTI-FUNGAL) 1 % cream Apply 1 Application topically 2 (two) times daily. Please Mail to the patient 30 g 0   Continuous Glucose Sensor (DEXCOM G7 SENSOR) MISC Please use to check glucose and change every 10 days  1 each 3   Dexlansoprazole  (DEXILANT ) 30 MG capsule DR Take 1 capsule (30 mg total) by mouth 2 (two) times daily. 60 capsule 12   DULoxetine  (CYMBALTA ) 60 MG capsule Take 1 capsule (60 mg total) by mouth daily. Please mail, we are changing all of his medications as of 02/29/2024 90 capsule 3   ezetimibe  (ZETIA ) 10 MG tablet Take 1 tablet (10 mg total) by mouth every evening. 90 tablet 2   glucose blood (ACCU-CHEK GUIDE TEST) test strip Use to check blood sugar up to 4 (four) times daily. 100 each 12   insulin  glargine (LANTUS  SOLOSTAR) 100 UNIT/ML Solostar Pen Inject 40 Units into the skin daily. 15 mL 11   Insulin  Pen Needle (COMFORT EZ PEN NEEDLES) 31G X 6 MM MISC Please use 1 needle to inject your insulin . Never use the same needle twice 100 each 3   Lancets MISC Use to test Blood Sugars 3 (three) times per day with meals and as needed 360 each 3   mirabegron  ER (MYRBETRIQ ) 25 MG TB24 tablet Take 2 tablets (50 mg total) by mouth 1 hour prior to bedtime daily. 180 tablet 1   oxybutynin  (DITROPAN  XL) 15 MG 24 hr tablet Take one tablet (15 mg dose) by mouth daily. 90 tablet 3   sacubitril -valsartan  (ENTRESTO ) 49-51 MG Take 1 tablet by mouth 2 (two) times daily. 60 tablet 11   spironolactone  (ALDACTONE ) 25 MG tablet Take 1 tablet (25 mg total) by mouth daily. 30 tablet 11   torsemide  (DEMADEX ) 20 MG tablet Please take 1 tablet if you gain more than 3 pounds in 24 hours or more than 5 pounds in 1 week 30 tablet 2   No current facility-administered medications on file prior to visit.    Family History  Problem Relation Age of Onset   Diabetes Mother    Ulcerative colitis Mother    Breast cancer Mother    Hypertension Father    Stroke Father    Diabetes Maternal Grandmother    Colon cancer Neg Hx    Prostate cancer Neg Hx    Heart attack Neg Hx    Esophageal cancer Neg Hx    Inflammatory bowel disease Neg Hx    Liver disease Neg Hx    Pancreatic cancer Neg Hx    Rectal cancer Neg Hx     Stomach cancer Neg Hx    Colon polyps Neg Hx     Social History   Socioeconomic History   Marital status: Single    Spouse name: Not on file   Number of children: 4   Years of education: 16   Highest education level: Not on file  Occupational History   Occupation:        Employer: UNEMPLOYED  Tobacco Use   Smoking status: Never   Smokeless  tobacco: Never  Vaping Use   Vaping status: Never Used  Substance and Sexual Activity   Alcohol use: No    Alcohol/week: 0.0 standard drinks of alcohol   Drug use: No   Sexual activity: Not Currently  Other Topics Concern   Not on file  Social History Narrative   Divorced, 4 children, lives alone.     Social Drivers of Corporate investment banker Strain: Low Risk  (07/12/2023)   Received from [facility]   Overall Financial Resource Strain (CARDIA)    Difficulty of Paying Living Expenses: Not hard at all  Food Insecurity: No Food Insecurity (11/16/2023)   Hunger Vital Sign    Worried About Running Out of Food in the Last Year: Never true    Ran Out of Food in the Last Year: Never true  Transportation Needs: No Transportation Needs (11/16/2023)   PRAPARE - Administrator, Civil Service (Medical): No    Lack of Transportation (Non-Medical): No  Physical Activity: Sufficiently Active (08/12/2022)   Exercise Vital Sign    Days of Exercise per Week: 7 days    Minutes of Exercise per Session: 30 min  Stress: No Stress Concern Present (07/15/2022)   [employer] of Occupational Health - Occupational Stress Questionnaire    Feeling of Stress : Only a little  Social Connections: Moderately Integrated (08/12/2022)   Social Connection and Isolation Panel    Frequency of Communication with Friends and Family: More than three times a week    Frequency of Social Gatherings with Friends and Family: More than three times a week    Attends Religious Services: More than 4 times per year    Active Member of [employer] or Organizations: Yes     Attends Engineer, structural: More than 4 times per year    Marital Status: Divorced  Intimate Partner Violence: Not At Risk (11/16/2023)   Humiliation, Afraid, Rape, and Kick questionnaire    Fear of Current or Ex-Partner: No    Emotionally Abused: No    Physically Abused: No    Sexually Abused: No    Review of Systems: ROS negative except for what is noted on the assessment and plan.  Vitals:   03/07/24 0930 03/07/24 1020  BP: (!) 180/102 (!) 162/101  Pulse: 96 94  Temp: 98.2 F (36.8 C)   TempSrc: Oral   SpO2: 99%   Weight: 275 lb 3.2 oz (124.8 kg)   Height: 6' 1 (1.854 m)     Physical Exam: Constitutional: well-appearing male, intermittently coughing and blowing nose, in no acute distress HENT: normocephalic atraumatic, mucous membranes moist, no erythema of throat, sinus nontender  Eyes: conjunctiva non-erythematous Neck: supple Cardiovascular: regular rate and rhythm, no m/r/g Pulmonary/Chest: normal work of breathing on room air, lungs clear to auscultation bilaterally   Assessment & Plan:   Uncontrolled type 2 diabetes mellitus with hyperglycemia, with long-term current use of insulin  (HCC) Previously on Lantus  insulin  at 40 units daily, he has not been taking insulin  due to not having received it yet. His A1c is very high, prompting a change in his diabetes, he will be followed up closely once he gets his insulin  and starts taking it daily.   Medication management He has not been taking any medications due to a delay in receiving new prescriptions. Medications are being prepared in individual packs at the pharmacy. Called pharmacy and they stated his medication should arrive on day of encounter or the next day.  -  Ensure he receives new medication regimen from pharmacy. - Advise him to resume Eliquis  as it was not changed. - Review medications with him to determine what he has and what he needs.  Chronic combined systolic and diastolic CHF (congestive  heart failure) (HCC) Started on Entresto  and spironolactone  at half doses last week but has not been taking them due to the delay in receiving medications. Previously non-adherent to his guideline-directed medical therapy (GDMT).  His regimen should be as follows:   Carvedilol  6.25 mg twice a day Spironolactone  25 mg daily Entresto  49-51 BID  URTI (acute upper respiratory infection) He has a cough that started on Tuesday after sleeping under a fan. The cough is persistent, with occasional phlegm production, and causes chest discomfort. No sore throat, but the cough worsens with movement. He has been using home remedies like lemon and honey to manage the symptoms. Likely viral in nature, given the season and symptoms. - Perform swab for COVID, flu, and RSV. - Recommend Mucinex     Follow-Up Scheduled telephone visit with the pharmacy team on October 30th at 9 AM to discuss his medications. - Ensure he attends telephone visit with pharmacy team on October 30th at 9 AM. - Also requested hour long visit in two weeks  Patient discussed with Dr. Trudy Dirks Littie Chiem, M.D. [facility], PGY-3 Pager: [phone] Date 03/08/2024 Time 8:33 AM

## 2024-03-08 NOTE — Assessment & Plan Note (Signed)
 He has not been taking any medications due to a delay in receiving new prescriptions. Medications are being prepared in individual packs at the pharmacy. Called pharmacy and they stated his medication should arrive on day of encounter or the next day.  - Ensure he receives new medication regimen from pharmacy. - Advise him to resume Eliquis  as it was not changed. - Review medications with him to determine what he has and what he needs.

## 2024-03-09 LAB — COVID-19, FLU A+B AND RSV
Influenza A, NAA: NOT DETECTED
Influenza B, NAA: NOT DETECTED
RSV, NAA: NOT DETECTED
SARS-CoV-2, NAA: NOT DETECTED

## 2024-03-13 ENCOUNTER — Ambulatory Visit: Payer: Self-pay | Admitting: Student

## 2024-03-15 ENCOUNTER — Other Ambulatory Visit: Payer: Self-pay

## 2024-03-15 ENCOUNTER — Other Ambulatory Visit (HOSPITAL_COMMUNITY): Payer: Self-pay

## 2024-03-15 ENCOUNTER — Other Ambulatory Visit

## 2024-03-15 DIAGNOSIS — E1142 Type 2 diabetes mellitus with diabetic polyneuropathy: Secondary | ICD-10-CM

## 2024-03-15 DIAGNOSIS — E1165 Type 2 diabetes mellitus with hyperglycemia: Secondary | ICD-10-CM

## 2024-03-15 DIAGNOSIS — Z794 Long term (current) use of insulin: Secondary | ICD-10-CM

## 2024-03-15 MED ORDER — DEXCOM G7 SENSOR MISC
3 refills | Status: DC
  Filled 2024-03-15: qty 9, 90d supply, fill #0
  Filled 2024-05-02: qty 9, 90d supply, fill #1

## 2024-03-15 NOTE — Progress Notes (Signed)
 03/15/2024 Name: Terry Parks MRN: [id number] DOB: [birth year]  Chief Complaint  Patient presents with   Diabetes   Congestive Heart Failure    Terry Parks is a 68 y.o. year old male who was referred for medication management by their primary care provider, Nooruddin, Saad, MD. They were scheduled for a telephone appointment today.   They were referred to the pharmacist by their PCP for assistance in managing complex medication management and concerns for nonadherence. PMH includes HTN, CHF (EF 35-40% in 2020), Afib, OSA, HLD, CKD3, BMI > 30.   Subjective: Patient was last seen by PCP, Dr. Nelia, on 03/07/24. At last visit, BP was 162/101 mmHg (initially 180/102 mmHg), HR 94. He reported he had not received Lantus  yet and had not been taking insulin  (though per fill hx, he has been receiving consistently). A1C on 02/29/24 was > 14%. He had also been out of his other medications due to awaiting compliance packaging from [facility] pharmacy. He previously had not been taking any of his medications for several months because he did not feel they were helping. At OV on 02/29/24, he was started on Entresto  and spironolactone , but again, had not started these yet. Jardiance  was recently discontinued due to groin infection.  Today, patient reports he is doing ok. Says he has been taking his medications since they were received on 03/08/24. He does forget some doses, but states he will just move to the next slot in his pack. Says they are packed three times/day (before breakfast, after breakfast, before bedtime), which works well for him. He confirms he received spironolactone  in a separate bottle, but he has been forgetting to take it because it is not in his pack (explained that it cannot be included in pack due to being a hazardous medication, and cannot be put in the pill packing machine). He denies side effects, dizziness, lightheadedness, since restarting medications. He says he is  still coughing, but feels better than he did last week.   Care Team: Primary Care Provider: Nooruddin, Saad, MD ; Next Scheduled Visit: 03/22/24 - confirmed appt today   Medication Access/Adherence  Current Pharmacy:  [facility] [address] [place] [address] Phone: [phone] Fax: [phone]  D[facility] - [facility] [address] [place] [address] Phone: [phone] Fax: [phone]   Patient reports affordability concerns with their medications: No  - UHC Dual Complete  Patient reports access/transportation concerns to their pharmacy: No  - Using [facility] mail order (compliance packaging)  Patient reports adherence concerns with their medications:  Yes  - went several months without taking medications due to no percieved benefit, though he has been receiving medications in compliance packs throughout that period. He had also discontinued his insulin . He has now resumed medication adherence for ~1 week, but still misses doses occasionally.   Diabetes:  Current medications: Lantus  40 units daily (in the morning) Medications tried in the past: metformin  (concerned about side effects), Ozempic  (d/c in the setting of esophagitis, chronic calcific pancreatitis on CT)  Current glucose readings: has not been monitoring blood sugars Received 1 Dexcom G7 sensor in the mail. He downloaded the app himself, tried to apply sensor and connect, but he could not get it to work. He is willing to retry if we mail out more sensors - will update Rx for larger supply. Confirms that he has glucometer and supplies to check FBG with fingersticks while he awaits new shipment  of Dexcom sensors  Patient denies hypoglycemic s/sx including dizziness, shakiness, sweating. Patient reports hyperglycemic symptoms including polyuria, polydipsia, polyphagia, nocturia, neuropathy. Denies blurred vision, except when he forgets his  glasses  Current meal patterns: Eating at least once per day - unable to describe specific meals, stating he eats whatever is available. - Drinks - drinking Powerade (1 or 2 per day), drinks water  throughout the day (6 bottles).  Current physical activity: did not discuss today  Heart Failure (EF 35-40% in 2020):  Current medications:  ACEi/ARB/ARNI: Entresto  49-51 mg BID SGLT2i: none - discontinued Jardiance  due to groin infection (02/29/24) and A1C > 14% Beta blocker: carvedilol  6.25 mg BID Mineralocorticoid Receptor Antagonist: spironolactone  25 mg daily (has not been taking, since it comes in a separate bottle, but he will restart) Diuretic regimen: torsemide  20 mg PRN weight gain/swelling (has not needed)  Current home blood pressure readings: has cuff at home - not currently checking, but willing to restart  Patient denies volume overload signs or symptoms including shortness of breath, lower extremity edema, increased use of pillows at night  Denies dizziness/lightheadedness. Continues to report issues with his balance, but this did not change after resuming his medications.  Atrial Fibrillation:  Current medications: Rate Control: carvedilol  6.25 mg BID Rhythm Control: none - amiodarone  discontinued 02/29/24 due to nonadherence, not in RVR Anticoagulation Regimen: Eliquis  5 mg BID  Hyperlipidemia/ASCVD Risk Reduction  Current lipid lowering medications: atorvastatin  40 mg daily, ezetimibe  10 mg daily Medications tried in the past:   Antiplatelet regimen: Eliquis  5 mg BID (Afib)  ASCVD History: none known Family History: Stroke in father Risk Factors: HFrEF, T2DM, HTN, HLD  Objective:  BP Readings from Last 3 Encounters:  03/07/24 (!) 162/101  02/29/24 (!) 180/98  07/25/23 (!) 150/87    Lab Results  Component Value Date   HGBA1C >14.0 (A) 02/29/2024   HGBA1C >14.0 (A) 07/25/2023   HGBA1C 14.9 (H) 04/13/2023       Latest Ref Rng & Units 02/29/2024    10:04 AM 04/13/2023   10:33 AM 04/01/2023   12:22 PM  BMP  Glucose 70 - 99 mg/dL 612  399  611   BUN 8 - 27 mg/dL 13  17  16    Creatinine 0.76 - 1.27 mg/dL 8.66  8.20  8.45   BUN/Creat Ratio 10 - 24 10  9     Sodium 134 - 144 mmol/L 136  135  138   Potassium 3.5 - 5.2 mmol/L 4.1  4.0  4.2   Chloride 96 - 106 mmol/L 101  93  102   CO2 20 - 29 mmol/L 20  26  19    Calcium  8.6 - 10.2 mg/dL 8.5  89.8  8.7     Lab Results  Component Value Date   CHOL 133 10/12/2022   HDL 23 (L) 10/12/2022   LDLCALC 64 10/12/2022   TRIG 285 (H) 10/12/2022   CHOLHDL 5.8 (H) 10/12/2022    Medications Reviewed Today     Reviewed by Brinda Lorain SQUIBB, RPH (Pharmacist) on 03/15/24 at [phone]  Med List Status: <None>   Medication Order Taking? Sig Documenting Provider Last Dose Status Informant  Accu-Chek Softclix Lancets lancets [id number]  Check blood sugar three times a day as instructed Lou Claretta HERO, MD  Active Self  apixaban  (ELIQUIS ) 5 MG TABS tablet [id number] Yes Take 1 tablet (5 mg total) by mouth 2 (two) times daily. Tobie Gaines, DO  Active   atorvastatin  (LIPITOR) 40 MG tablet [id number]  Yes Take 1 tablet (40 mg total) by mouth every evening. Tobie Gaines, DO  Active   carvedilol  (COREG ) 6.25 MG tablet [id number] Yes Take 1 tablet (6.25 mg total) by mouth 2 (two) times daily with a meal. Tobie Gaines, DO  Active   clotrimazole (CLOTRIMAZOLE ANTI-FUNGAL) 1 % cream [id number]  Apply 1 Application topically 2 (two) times daily. Please Mail to the patient Tobie Gaines, DO  Active   Continuous Glucose Sensor (DEXCOM G7 SENSOR) MISC [id number]  Please use to check glucose and change every 10 days  Patient not taking: Reported on 03/15/2024   Tobie Gaines, DO  Active   Dexlansoprazole  (DEXILANT ) 30 MG capsule DR [id number]  Take 1 capsule (30 mg total) by mouth 2 (two) times daily.  Patient not taking: Reported on 03/15/2024   Mansouraty, Aloha Raddle., MD  Active   DULoxetine  (CYMBALTA ) 60 MG capsule [id number] Yes  Take 1 capsule (60 mg total) by mouth daily. Please mail, we are changing all of his medications as of 02/29/2024 Tobie Gaines, DO  Active   ezetimibe  (ZETIA ) 10 MG tablet [id number] Yes Take 1 tablet (10 mg total) by mouth every evening. Tobie Gaines, DO  Active   glucose blood (ACCU-CHEK GUIDE TEST) test strip [id number]  Use to check blood sugar up to 4 (four) times daily. Tobie Gaines, DO  Active   insulin  glargine (LANTUS  SOLOSTAR) 100 UNIT/ML Solostar Pen [id number] Yes Inject 40 Units into the skin daily. Tobie Gaines, DO  Active   Insulin  Pen Needle (COMFORT EZ PEN NEEDLES) 31G X 6 MM MISC [id number]  Please use 1 needle to inject your insulin . Never use the same needle twice Tobie Gaines, DO  Active   Lancets MISC [id number]  Use to test Blood Sugars 3 (three) times per day with meals and as needed Tobie Gaines, DO  Active   mirabegron  ER (MYRBETRIQ ) 25 MG TB24 tablet [id number] Yes Take 2 tablets (50 mg total) by mouth 1 hour prior to bedtime daily. Tobie Gaines, DO  Active   oxybutynin  (DITROPAN  XL) 15 MG 24 hr tablet [id number] Yes Take one tablet (15 mg dose) by mouth daily. Tobie Gaines, DO  Active   sacubitril -valsartan  (ENTRESTO ) 49-51 MG [id number] Yes Take 1 tablet by mouth 2 (two) times daily. Tobie Gaines, DO  Active   spironolactone  (ALDACTONE ) 25 MG tablet [id number]  Take 1 tablet (25 mg total) by mouth daily.  Patient not taking: Reported on 03/15/2024   Tobie Gaines, DO  Active   torsemide  (DEMADEX ) 20 MG tablet [id number]  Please take 1 tablet if you gain more than 3 pounds in 24 hours or more than 5 pounds in 1 week  Patient not taking: Reported on 03/15/2024   Tobie Gaines, DO  Active               Assessment/Plan:   Type 2 Diabetes: - Currently uncontrolled with most recent A1C of > 14% above goal <7%. Medication adherence appears improved, as he reports he is taking Lantus  40 units every morning. However he is not monitoring his blood sugars. Denies s/sx of hypoglycemia. Unable to  titrate today, but will facilitate getting him a 3 mo supply of Dexcom sensors. Advised him to bring sensors and phone to appt next week for pharmacy consult and setup. Patient is not on a GLP-1RA due to c/f worsening esophagitis and evidence of chronic calcific pancreatitis on CT scan. Can consider resuming SGLT2i once glycemic control improves, but currently not recommended with ongoing groin  infection and elevated A1C. - Last UACR Oct 2024 - 70 mg/g - Reviewed long term cardiovascular and renal outcomes of uncontrolled blood sugar - Reviewed goal A1c, goal fasting, and goal 2 hour post prandial glucose - Reviewed hypoglycemia management plan and the rule of 15 - Reviewed dietary modifications including  utilizing the healthy plate method, limiting portion size of carbohydrate foods, increasing intake of protein and non-starchy vegetables. Counseled patient to stay hydrated with water  throughout the day. Advised him to find sugar-free alternative to daily Powerade drink. - Recommend to continue Lantus  40 units daily  - Recommend to check glucose twice daily: fasting and 2-hr PPG with glucometer until new Dexcom sensors can be applied. Counseled patient to bring glucometer or BG log to every appointment. Advised to bring phone and Dexcom sensors with him next week for set-up and application. - Next A1C due 05/31/24     Heart Failure: - Currently appropriately managed. Patient had not yet resumed spironolactone  as instructed, but was reminded to start this medication today. He is not reporting s/sx consistent with fluid overload. He has not been monitoring his BP, but if still elevated can titrate Entresto  for additional control pending BMP.  - Reviewed appropriate blood pressure monitoring technique and reviewed goal blood pressure - Reviewed to weigh daily and when to contact cardiology with weight gain - Recommend to continue current regimen:  Current medications:  ACEi/ARB/ARNI: Entresto  49-51 mg  BID SGLT2i: none - discontinued Jardiance  due to groin infection (02/29/24) and A1C > 14% Beta blocker: carvedilol  6.25 mg BID Mineralocorticoid Receptor Antagonist: spironolactone  25 mg daily  Diuretic regimen: torsemide  20 mg PRN weight gain/swelling  - Recheck BMP to monitor Scr and K at upcoming PCP appt on 03/22/24   Medication Management: - Currently strategy sufficient to maintain appropriate adherence to prescribed medication regimen. Will need to continue to work closely with [facility] pharmacy to update pill packs as needed.  - Noted that patient was prescribed twice daily PPI dexlansoprazole  in July 2024 (with 11 refills) by Dr. Wilhelmenia (GI) for esophagitis, but this was never dispensed. Patient last dispensed esomeprazole  in Feb 2024. Will discuss with PCP if this should be re-prescribed and added to next month's pill pack.   Written patient instructions provided. Patient verbalized understanding of treatment plan.   Follow Up Plan:  Pharmacist in person 04/19/24 PCP clinic visit in 03/22/24   Lorain Baseman, PharmD [facility] [phone]

## 2024-03-19 NOTE — Progress Notes (Signed)
 Internal Medicine Clinic Attending  Case discussed with the resident at the time of the visit.  We reviewed the resident's history and exam and pertinent patient test results.  I agree with the assessment, diagnosis, and plan of care documented in the resident's note.

## 2024-03-22 ENCOUNTER — Ambulatory Visit: Admitting: Student

## 2024-03-30 ENCOUNTER — Other Ambulatory Visit: Payer: Self-pay

## 2024-04-02 ENCOUNTER — Other Ambulatory Visit: Payer: Self-pay

## 2024-04-03 ENCOUNTER — Other Ambulatory Visit: Payer: Self-pay

## 2024-04-04 ENCOUNTER — Other Ambulatory Visit: Payer: Self-pay

## 2024-04-05 ENCOUNTER — Other Ambulatory Visit: Payer: Self-pay

## 2024-04-17 ENCOUNTER — Other Ambulatory Visit (HOSPITAL_COMMUNITY): Payer: Self-pay

## 2024-04-17 MED ORDER — AMOXICILLIN 500 MG PO CAPS
500.0000 mg | ORAL_CAPSULE | Freq: Three times a day (TID) | ORAL | 0 refills | Status: DC
  Filled 2024-04-17: qty 21, 7d supply, fill #0

## 2024-04-19 ENCOUNTER — Other Ambulatory Visit (HOSPITAL_COMMUNITY): Payer: Self-pay

## 2024-04-19 ENCOUNTER — Telehealth: Payer: Self-pay

## 2024-04-19 ENCOUNTER — Ambulatory Visit

## 2024-04-19 NOTE — Progress Notes (Deleted)
 04/19/2024 Name: Terry Parks MRN: [id number] DOB: 1955-06-17  No chief complaint on file.   Terry Parks is a 68 y.o. year old male who was referred for medication management by their primary care provider, Nooruddin, Saad, MD. They were scheduled for a face-to-face appointment today.***   They were referred to the pharmacist by their PCP for assistance in managing complex medication management and concerns for nonadherence. PMH includes HTN, CHF (EF 35-40% in 2020), Afib, OSA, HLD, CKD3, BMI > 30.   Subjective: Patient was last seen by PCP, Dr. Nelia, on 03/07/24. At last visit, BP was 162/101 mmHg (initially 180/102 mmHg), HR 94. He reported he had not received Lantus  yet and had not been taking insulin  (though per fill hx, he has been receiving consistently). A1C on 02/29/24 was > 14%. He had also been out of his other medications due to awaiting compliance packaging from [facility] pharmacy. He previously had not been taking any of his medications for several months because he did not feel they were helping. At OV on 02/29/24, he was started on Entresto  and spironolactone , but again, had not started these yet. Jardiance  was recently discontinued due to groin infection. At pharmacy call on 03/15/24, patient confirmed taking the medications in his pill pack. Had forgotten to start spironolactone , since it was in a separate bottle. He reported he was taking Lantus  40 units daily, but not monitoring sugars. Encouraged to bring Dexcom sensors to upcoming appt. Discussed switching daily Powerade drink to a sugar free option. He did not show to PCP appt on 03/22/24.  Today, patient reports he is doing ok. ***   Needs refill of amlodipine ?  OLD: Says he has been taking his medications since they were received on 03/08/24. He does forget some doses, but states he will just move to the next slot in his pack. Says they are packed three times/day (before breakfast, after breakfast, before  bedtime), which works well for him. He confirms he received spironolactone  in a separate bottle, but he has been forgetting to take it because it is not in his pack (explained that it cannot be included in pack due to being a hazardous medication, and cannot be put in the pill packing machine). He denies side effects, dizziness, lightheadedness, since restarting medications. He says he is still coughing, but feels better than he did last week.   Care Team: Primary Care Provider: Nooruddin, Saad, MD ; Next Scheduled Visit: 03/22/24 - confirmed appt today   Medication Access/Adherence  Current Pharmacy:  [place] [facility] [address] [place] [address] Phone: [phone] Fax: [phone]  DARRYLE LONG - [facility] [address] [place] [address] Phone: [phone] Fax: [phone]   Patient reports affordability concerns with their medications: No  - UHC Dual Complete  Patient reports access/transportation concerns to their pharmacy: No  - Using [facility] mail order (compliance packaging)  Patient reports adherence concerns with their medications:  Yes  - went several months without taking medications due to no percieved benefit, though he has been receiving medications in compliance packs throughout that period. He had also discontinued his insulin . He has now resumed medication adherence for ~1 week, but still misses doses occasionally.   Diabetes:  Current medications: Lantus  40 units daily (in the morning) Medications tried in the past: metformin  (concerned about side effects), Ozempic  (d/c in the setting of esophagitis, chronic calcific pancreatitis on CT)  Current glucose readings: has not been  monitoring blood sugars Received 1 Dexcom G7 sensor in the mail. He downloaded the app himself, tried to apply sensor and connect, but he could not get it to work. He is willing to retry if we mail out more sensors -  will update Rx for larger supply. Confirms that he has glucometer and supplies to check FBG with fingersticks while he awaits new shipment of Dexcom sensors  Patient denies hypoglycemic s/sx including dizziness, shakiness, sweating. Patient reports hyperglycemic symptoms including polyuria, polydipsia, polyphagia, nocturia, neuropathy. Denies blurred vision, except when he forgets his glasses  Current meal patterns: Eating at least once per day - unable to describe specific meals, stating he eats whatever is available. - Drinks - drinking Powerade (1 or 2 per day), drinks water  throughout the day (6 bottles).  Current physical activity: did not discuss today  Heart Failure (EF 35-40% in 2020):  Current medications:  ACEi/ARB/ARNI: Entresto  49-51 mg BID SGLT2i: none - discontinued Jardiance  due to groin infection (02/29/24) and A1C > 14% Beta blocker: carvedilol  6.25 mg BID Mineralocorticoid Receptor Antagonist: spironolactone  25 mg daily (has not been taking, since it comes in a separate bottle, but he will restart) Diuretic regimen: torsemide  20 mg PRN weight gain/swelling (has not needed)  Current home blood pressure readings: has cuff at home - not currently checking, but willing to restart  Patient denies volume overload signs or symptoms including shortness of breath, lower extremity edema, increased use of pillows at night  Denies dizziness/lightheadedness. Continues to report issues with his balance, but this did not change after resuming his medications.  Atrial Fibrillation:  Current medications: Rate Control: carvedilol  6.25 mg BID Rhythm Control: none - amiodarone  discontinued 02/29/24 due to nonadherence, not in RVR Anticoagulation Regimen: Eliquis  5 mg BID  Hyperlipidemia/ASCVD Risk Reduction  Current lipid lowering medications: atorvastatin  40 mg daily, ezetimibe  10 mg daily Medications tried in the past:   Antiplatelet regimen: Eliquis  5 mg BID (Afib)  ASCVD  History: none known Family History: Stroke in father Risk Factors: HFrEF, T2DM, HTN, HLD  Objective:  BP Readings from Last 3 Encounters:  03/07/24 (!) 162/101  02/29/24 (!) 180/98  07/25/23 (!) 150/87    Lab Results  Component Value Date   HGBA1C >14.0 (A) 02/29/2024   HGBA1C >14.0 (A) 07/25/2023   HGBA1C 14.9 (H) 04/13/2023       Latest Ref Rng & Units 02/29/2024   10:04 AM 04/13/2023   10:33 AM 04/01/2023   12:22 PM  BMP  Glucose 70 - 99 mg/dL 612  399  611   BUN 8 - 27 mg/dL 13  17  16    Creatinine 0.76 - 1.27 mg/dL 8.66  8.20  8.45   BUN/Creat Ratio 10 - 24 10  9     Sodium 134 - 144 mmol/L 136  135  138   Potassium 3.5 - 5.2 mmol/L 4.1  4.0  4.2   Chloride 96 - 106 mmol/L 101  93  102   CO2 20 - 29 mmol/L 20  26  19    Calcium  8.6 - 10.2 mg/dL 8.5  89.8  8.7     Lab Results  Component Value Date   CHOL 133 10/12/2022   HDL 23 (L) 10/12/2022   LDLCALC 64 10/12/2022   TRIG 285 (H) 10/12/2022   CHOLHDL 5.8 (H) 10/12/2022    Medications Reviewed Today   Medications were not reviewed in this encounter       Assessment/Plan:   Type 2 Diabetes: - Currently uncontrolled  with most recent A1C of > 14% above goal <7%. Medication adherence appears improved, as he reports he is taking Lantus  40 units every morning. However he is not monitoring his blood sugars. Denies s/sx of hypoglycemia. Unable to titrate today, but will facilitate getting him a 3 mo supply of Dexcom sensors. Advised him to bring sensors and phone to appt next week for pharmacy consult and setup. Patient is not on a GLP-1RA due to c/f worsening esophagitis and evidence of chronic calcific pancreatitis on CT scan. Can consider resuming SGLT2i once glycemic control improves, but currently not recommended with ongoing groin infection and elevated A1C. - Last UACR Oct 2024 - 70 mg/g - Reviewed long term cardiovascular and renal outcomes of uncontrolled blood sugar - Reviewed goal A1c, goal fasting, and  goal 2 hour post prandial glucose - Reviewed hypoglycemia management plan and the rule of 15 - Reviewed dietary modifications including  utilizing the healthy plate method, limiting portion size of carbohydrate foods, increasing intake of protein and non-starchy vegetables. Counseled patient to stay hydrated with water  throughout the day. Advised him to find sugar-free alternative to daily Powerade drink. - Recommend to continue Lantus  40 units daily  - Recommend to check glucose twice daily: fasting and 2-hr PPG with glucometer until new Dexcom sensors can be applied. Counseled patient to bring glucometer or BG log to every appointment. Advised to bring phone and Dexcom sensors with him next week for set-up and application. - Next A1C due 05/31/24     Heart Failure: - Currently appropriately managed. Patient had not yet resumed spironolactone  as instructed, but was reminded to start this medication today. He is not reporting s/sx consistent with fluid overload. He has not been monitoring his BP, but if still elevated can titrate Entresto  for additional control pending BMP.  - Reviewed appropriate blood pressure monitoring technique and reviewed goal blood pressure - Reviewed to weigh daily and when to contact cardiology with weight gain - Recommend to continue current regimen:  Current medications:  ACEi/ARB/ARNI: Entresto  49-51 mg BID SGLT2i: none - discontinued Jardiance  due to groin infection (02/29/24) and A1C > 14% Beta blocker: carvedilol  6.25 mg BID Mineralocorticoid Receptor Antagonist: spironolactone  25 mg daily  Diuretic regimen: torsemide  20 mg PRN weight gain/swelling  - Recheck BMP to monitor Scr and K at upcoming PCP appt on 03/22/24   Medication Management: - Currently strategy sufficient to maintain appropriate adherence to prescribed medication regimen. Will need to continue to work closely with [facility] pharmacy to update pill packs as needed.  - Noted that patient was prescribed  twice daily PPI dexlansoprazole  in July 2024 (with 11 refills) by Dr. Wilhelmenia (GI) for esophagitis, but this was never dispensed. Patient last dispensed esomeprazole  in Feb 2024. Will discuss with PCP if this should be re-prescribed and added to next month's pill pack.   Written patient instructions provided. Patient verbalized understanding of treatment plan.   Follow Up Plan:  Pharmacist in person 04/19/24 PCP clinic visit in 03/22/24   Lorain Baseman, PharmD [facility] [phone]

## 2024-04-19 NOTE — Telephone Encounter (Signed)
 Attempted to contact patient for scheduled appointment for medication management. Left HIPAA compliant message for patient to return my call at their convenience.   Lorain Baseman, PharmD [facility] [phone]

## 2024-04-26 ENCOUNTER — Telehealth: Payer: Self-pay

## 2024-04-26 NOTE — Progress Notes (Unsigned)
 Complex Care Management Care Guide Note  04/26/2024 Name: [name] MRN: [id number] DOB: 1955/08/24  Terry Parks is a 68 y.o. year old male who is a primary care patient of Nooruddin, Saad, MD and is actively engaged with the care management team. I reached out to [name] by phone today to assist with re-scheduling  with the Pharmacist.  Follow up plan: Unsuccessful telephone outreach attempt made. A HIPAA compliant phone message was left for the patient providing contact information and requesting a return call.  Leotis Rase [facility], [facility] Guide  Direct Dial : [phone]  Fax [phone]

## 2024-04-30 ENCOUNTER — Other Ambulatory Visit: Payer: Self-pay

## 2024-05-01 ENCOUNTER — Other Ambulatory Visit: Payer: Self-pay

## 2024-05-01 ENCOUNTER — Other Ambulatory Visit (HOSPITAL_COMMUNITY): Payer: Self-pay

## 2024-05-01 MED ORDER — CEPHALEXIN 500 MG PO CAPS
500.0000 mg | ORAL_CAPSULE | Freq: Four times a day (QID) | ORAL | 0 refills | Status: DC
  Filled 2024-05-01: qty 40, 10d supply, fill #0

## 2024-05-02 ENCOUNTER — Other Ambulatory Visit (HOSPITAL_COMMUNITY): Payer: Self-pay

## 2024-05-02 ENCOUNTER — Other Ambulatory Visit: Payer: Self-pay | Admitting: Student

## 2024-05-02 ENCOUNTER — Other Ambulatory Visit: Payer: Self-pay

## 2024-05-02 DIAGNOSIS — E1165 Type 2 diabetes mellitus with hyperglycemia: Secondary | ICD-10-CM

## 2024-05-02 DIAGNOSIS — E1142 Type 2 diabetes mellitus with diabetic polyneuropathy: Secondary | ICD-10-CM

## 2024-05-02 MED ORDER — DEXCOM G7 SENSOR MISC
3 refills | Status: AC
  Filled 2024-05-02: qty 9, 90d supply, fill #0

## 2024-05-02 NOTE — Telephone Encounter (Signed)
 Copied from CRM #[id number]. Topic: Clinical - Medication Refill >> May 02, 2024 11:45 AM Chiquita SQUIBB wrote: Medication: Continuous Glucose Sensor (DEXCOM G7 SENSOR)  Has the patient contacted their pharmacy? Yes (Agent: If no, request that the patient contact the pharmacy for the refill. If patient does not wish to contact the pharmacy document the reason why and proceed with request.) (Agent: If yes, when and what did the pharmacy advise?)  This is the patient's preferred pharmacy:   [facility] [address] [place] [address] Phone: [phone] Fax: [phone]  Is this the correct pharmacy for this prescription? Yes If no, delete pharmacy and type the correct one.   Has the prescription been filled recently? No  Is the patient out of the medication? Yes  Has the patient been seen for an appointment in the last year OR does the patient have an upcoming appointment? Yes  Can we respond through MyChart? Yes  Agent: Please be advised that Rx refills may take up to 3 business days. We ask that you follow-up with your pharmacy.

## 2024-05-03 ENCOUNTER — Other Ambulatory Visit (HOSPITAL_COMMUNITY): Payer: Self-pay

## 2024-05-11 ENCOUNTER — Ambulatory Visit: Payer: Self-pay

## 2024-05-11 NOTE — Telephone Encounter (Signed)
 FYI Only or Action Required?: FYI only for provider: appointment scheduled on 05/28/24 for follow up as requested.Proceeding to urgent care for acute symptoms.   Patient was last seen in primary care on 03/07/2024 by Nooruddin, Saad, MD  Called Nurse Triage reporting Shortness of Breath.  Symptoms began about a month ago.  Interventions attempted: Rest.  Symptoms are: unchanged.  Triage Disposition: See PCP Within 2 Weeks  Patient/caregiver understands and will follow disposition?: Yes, but will wait     Reason for Disposition  [1] MILD longstanding difficulty breathing (e.g., minimal/no SOB at rest, SOB with walking, pulse < 100) AND [2] SAME as normal  Answer Assessment - Initial Assessment Questions Additional info: Patient calling in to request prescriptions for PRN inhalers, he reports longstanding intermittent shortness of breath with exertion. He had previously been maintained on inhalers but stopped using them. Patient is planning a trip out of state and will not be returning until January.  Patient plans to proceed to urgent care for evaluation and potential prescription, he is requesting to schedule in clinic in January for follow up. Scheduled follow up on preferred date/time.     1. RESPIRATORY STATUS: Describe your breathing? (e.g., wheezing, shortness of breath, unable to speak, severe coughing)      Intermittent sob for one month 2. ONSET: When did this breathing problem begin?      One month 3. PATTERN Does the difficult breathing come and go, or has it been constant since it started?      Intermittent but daily, worse with exertion 4. SEVERITY: How bad is your breathing? (e.g., mild, moderate, severe)      Talking well on the call.  5. RECURRENT SYMPTOM: Have you had difficulty breathing before? If Yes, ask: When was the last time? and What happened that time?      yes 6. CARDIAC HISTORY: Do you have any history of heart disease? (e.g., heart  attack, angina, bypass surgery, angioplasty)      yes 7. LUNG HISTORY: Do you have any history of lung disease?  (e.g., pulmonary embolus, asthma, emphysema)     yes 8. CAUSE: What do you think is causing the breathing problem?      Weight gain  9. OTHER SYMPTOMS: Do you have any other symptoms? (e.g., chest pain, cough, dizziness, fever, runny nose)     Intermittent wheeze  Protocols used: Breathing Difficulty-A-AH

## 2024-05-11 NOTE — Telephone Encounter (Signed)
 Patient disconnected while being transferred to triage. Attempted to contact patient x 1 to discuss symptoms; LVM to return call, Will attempt to contact patient at a later time to further discuss concerns.            Copied from CRM #[id number]. Topic: Clinical - Red Word Triage >> May 11, 2024 11:33 AM Chiquita SQUIBB wrote: Red Word that prompted transfer to Nurse Triage: Patient is calling in stating he believes he needs inhalers again, patient states he has had shortness of breath for the last month and it is getting worse and he is wheezing more.

## 2024-05-14 NOTE — Telephone Encounter (Signed)
 RTC to patient.  States the Shortness of breath  is better when he takes his fluid pill.  Would like to see if he can get a refill on his Albuterol  Inhaler as it helps as well.  Unable to come in for an appointment right now.  Has an appointment scheduled for 05/27/2024. Would like for prescription to go to the [facility].

## 2024-05-16 ENCOUNTER — Other Ambulatory Visit (HOSPITAL_COMMUNITY): Payer: Self-pay

## 2024-05-16 ENCOUNTER — Encounter: Payer: Self-pay | Admitting: Student

## 2024-05-16 ENCOUNTER — Ambulatory Visit: Admitting: Student

## 2024-05-16 ENCOUNTER — Other Ambulatory Visit: Payer: Self-pay

## 2024-05-16 VITALS — BP 185/99 | HR 90 | Temp 97.9°F | Ht 73.0 in | Wt 307.0 lb

## 2024-05-16 DIAGNOSIS — J069 Acute upper respiratory infection, unspecified: Secondary | ICD-10-CM | POA: Diagnosis not present

## 2024-05-16 DIAGNOSIS — I5022 Chronic systolic (congestive) heart failure: Secondary | ICD-10-CM

## 2024-05-16 LAB — BASIC METABOLIC PANEL WITH GFR
BUN/Creatinine Ratio: 6 — ABNORMAL LOW (ref 10–24)
BUN: 8 mg/dL (ref 8–27)
CO2: 27 mmol/L (ref 20–29)
Calcium: 8.4 mg/dL — ABNORMAL LOW (ref 8.6–10.2)
Chloride: 106 mmol/L (ref 96–106)
Creatinine, Ser: 1.24 mg/dL (ref 0.76–1.27)
Glucose: 92 mg/dL (ref 70–99)
Potassium: 3.8 mmol/L (ref 3.5–5.2)
Sodium: 143 mmol/L (ref 134–144)
eGFR: 64 mL/min/1.73

## 2024-05-16 LAB — MAGNESIUM: Magnesium: 1.5 mg/dL — ABNORMAL LOW (ref 1.6–2.3)

## 2024-05-16 LAB — RESP PANEL BY RT-PCR (RSV, FLU A&B, COVID)  RVPGX2
Influenza A by PCR: POSITIVE — AB
Influenza B by PCR: NEGATIVE
Resp Syncytial Virus by PCR: NEGATIVE
SARS Coronavirus 2 by RT PCR: NEGATIVE

## 2024-05-16 MED ORDER — MAGNESIUM 100 MG PO CAPS
1.0000 | ORAL_CAPSULE | Freq: Every day | ORAL | 0 refills | Status: DC
  Filled 2024-05-16: qty 6, 6d supply, fill #0

## 2024-05-16 MED ORDER — OSELTAMIVIR PHOSPHATE 75 MG PO CAPS
75.0000 mg | ORAL_CAPSULE | Freq: Two times a day (BID) | ORAL | 0 refills | Status: DC
  Filled 2024-05-16: qty 10, 5d supply, fill #0

## 2024-05-16 MED ORDER — GUAIFENESIN-DM 100-10 MG/5ML PO SYRP
5.0000 mL | ORAL_SOLUTION | ORAL | 0 refills | Status: DC | PRN
  Filled 2024-05-16: qty 118, 4d supply, fill #0

## 2024-05-16 MED ORDER — FUROSEMIDE 80 MG PO TABS
80.0000 mg | ORAL_TABLET | Freq: Two times a day (BID) | ORAL | 0 refills | Status: DC
  Filled 2024-05-16: qty 11, 6d supply, fill #0

## 2024-05-16 NOTE — Progress Notes (Deleted)
*** ° ° ° °  CC: {Clinic Visit Type:31040}  HPI:  Terry Parks is a 68 y.o. male with pertinent PMH of hypertension, persistent atrial fibrillation on anticoagulation, combined systolic and diastolic CHF, OSA, type 2 diabetes with CKD stage III and retinopathy, obesity, and depression who presents as above. Please see assessment and plan below for further details.  Medications: Current Outpatient Medications  Medication Instructions   Accu-Chek Softclix Lancets lancets Check blood sugar three times a day as instructed   amoxicillin  (AMOXIL ) 500 MG capsule Take 1 capsule (500 mg total) by mouth every 8 (eight) hours for 7 days   atorvastatin  (LIPITOR) 40 mg, Oral, Every evening   carvedilol  (COREG ) 6.25 mg, Oral, 2 times daily with meals   cephALEXin  (KEFLEX ) 500 mg, Oral, 4 times daily   clotrimazole  (CLOTRIMAZOLE  ANTI-FUNGAL) 1 % cream 1 Application, Topical, 2 times daily, Please Mail to the patient   Continuous Glucose Sensor (DEXCOM G7 SENSOR) MISC Please use to check glucose and change every 10 days   Dexlansoprazole  (DEXILANT ) 30 mg, Oral, 2 times daily   DULoxetine  (CYMBALTA ) 60 mg, Oral, Daily, Please mail, we are changing all of his medications as of 02/29/2024   Eliquis  5 mg, Oral, 2 times daily   ezetimibe  (ZETIA ) 10 mg, Oral, Every evening   glucose blood (ACCU-CHEK GUIDE TEST) test strip Use to check blood sugar up to 4 (four) times daily.   Insulin  Pen Needle (COMFORT EZ PEN NEEDLES) 31G X 6 MM MISC Please use 1 needle to inject your insulin . Never use the same needle twice   Lancets MISC Use to test Blood Sugars 3 (three) times per day with meals and as needed   Lantus  SoloStar 40 Units, Subcutaneous, Daily   mirabegron  ER (MYRBETRIQ ) 25 MG TB24 tablet Take 2 tablets (50 mg total) by mouth 1 hour prior to bedtime daily.   oxybutynin  (DITROPAN  XL) 15 MG 24 hr tablet Take one tablet (15 mg dose) by mouth daily.   sacubitril -valsartan  (ENTRESTO ) 49-51 MG 1 tablet, Oral, 2  times daily   spironolactone  (ALDACTONE ) 25 mg, Oral, Daily   torsemide  (DEMADEX ) 20 MG tablet Please take 1 tablet if you gain more than 3 pounds in 24 hours or more than 5 pounds in 1 week     Review of Systems:   Pertinent items noted in HPI and/or A&P.  Physical Exam:  There were no vitals filed for this visit.  Constitutional:***. In no acute distress. HEENT: Normocephalic, atraumatic, Sclera non-icteric, PERRL, EOM intact Cardio:Regular rate and rhythm. 2+ bilateral {PulseLoc:28294} pulses. Pulm:Clear to auscultation bilaterally. Normal work of breathing on room air. Abdomen: Soft, non-tender, non-distended, positive bowel sounds. FDX:Wzhjupcz for extremity edema. Skin:Warm and dry. Neuro:Alert and oriented x3. No focal deficit noted. Psych:Pleasant mood and affect.   Assessment & Plan:   Assessment & Plan   No orders of the defined types were placed in this encounter.    No follow-ups on file.   Patient {GC/GE:3044014::discussed with,seen with} {JGIMTSattending2025/2026:32954}  Fairy Pool, DO [facility] Internal Medicine Resident PGY-3 Clinic Phone: [phone] Please contact the on call pager at [phone] for any urgent or emergent needs.

## 2024-05-16 NOTE — Progress Notes (Signed)
 "  CC: Shortness of breath and leg swelling  HPI:  [name] is a 68 y.o. male with past medical history of HFpEF, hypertension, presents for concerns of 1 month history of shortness of breath and 2-day history of cough.  Please see assessment and plan for full HPI   Past Medical History:  Diagnosis Date   Abscessed tooth    top back large cavity no pain or drainage, one on bottom  pt pulled tooth 4-5 months ago, right top large hole in tooth   AKI (acute kidney injury)    Allergic rhinitis    Anemia    Anxiety    Asthma    Atrial fibrillation (HCC)    BPH (benign prostatic hypertrophy)    Massive BPH noted on cystoscopy 1/23/ 2012 by Dr. Alline.   Cancer [facility])    prostate cancer 2019   Cardiomyopathy [facility])    CHF (congestive heart failure) (HCC)    Cough 03/30/2012   Depression    Diabetes mellitus 04/08/2008   type 2   Dyspnea    Dysrhythmia 2019   Foley catheter in place 07-05-17 placed   Fracture, orbital (HCC) 2021   Right   GERD (gastroesophageal reflux disease) 2020   Headache(784.0)    hx migraines none recent   History of esophagitis 12/16/2019   Hyperlipemia    Hypertension    Hypertensive cardiopathy 03/01/2006   2-D echocardiogram 02/01/2012 showed moderate LVH, mildly to moderately reduced left ventricular systolic function with an estimated ejection fraction of 40-45%, and diffuse hypokinesis.  A nuclear medicine stress study done 01/31/2012 showed no reversible ischemia, a small mid anterior wall fixed defect/infarct, and ejection fraction 42%.       Neck pain    Nephrolithiasis 05/29/2010   CT scan of abdomen/pelvis on 05/29/2010 showed an obstructing approximate 1-2 mm calculus at the left UVJ, and an approximate 1-2 mm left lower pole renal calculus.   Patient had continuing severe pain , and an elevation of his serum creatinine to a value of 1.75 on 06/06/2010.  Patient underwent cystoscopy on 06/08/2010 by Dr. Alline, but attempts at retrograde  pyelogram and ureteroscopy were unsucc   Numbness 01/08/2018   Obstructive sleep apnea 03/06/2008   Sleep study 03/06/08 showed severe OSA/hypopnea syndrome, with successful CPAP titration to 13 CWP using a medium ResMed Mirage Quattro full face mask with heated humidifier.    Personal history of prostate cancer 10/06/2017   Rash 04/17/2014   Renal calculus 05/29/2010   CT scan of abdomen/pelvis on 05/29/2010 showed an obstructing approximate 1-2 mm calculus at the left UVJ, and an approximate 1-2 mm left lower pole renal calculus.   Patient had continuing severe pain , and an elevation of his serum creatinine to a value of 1.75 on 06/06/2010.  The stone had apparently passed and was not seen on repeat CT 06/08/2010.   Sleep apnea    haven't use cpap in 2 years   Tooth pain 10/29/2017   Uncontrolled type 2 diabetes mellitus with hyperglycemia, with long-term current use of insulin  (HCC) 04/08/2008   GLP1 discontinued at hospital discharge 06/2022; concern about esophagitis? Delayed gastric emptying?  Intolerant to metformin      Urinary straining 11/02/2016    Current Medications[1]  Review of Systems:    Respiratory: Patient endorses shortness of breath and cough  Physical Exam:  Vitals:   05/16/24 1023  BP: (!) 185/99  Pulse: 90  Temp: 97.9 F (36.6 C)  TempSrc: Oral  SpO2:  94%  Weight: (!) 307 lb (139.3 kg)  Height: 6' 1 (1.854 m)   General: Patient coughing in room Head: Normocephalic, atraumatic  Neck: No JVD appreciated Cardio: Regular rate and rhythm, no murmurs, rubs or gallops Pulmonary: Bibasilar rhonchi appreciated to bilateral lung fields Extremities: 2+ pitting edema to bilateral lower extremities   Assessment & Plan:   Assessment & Plan URTI (acute upper respiratory infection) Patient presents with 2-day history of cough.  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  He states he developed a cough afterwards.  He is unsure if he had  sick contacts.  With concern for viral infection, flu, COVID, RSV swab obtained.  Patient was positive for flu.  Will start Tamiflu.  Plan: - Tamiflu provided 75 mg twice daily for 5 days Chronic systolic heart failure [facility]) Patient with past medical history of nonischemic cardiomyopathy. Most recent echocardiogram in 2020 showed EF of 35-40%.  Current GDMT includes Entresto , spironolactone , and carvedilol .  He has been adherent to his medications.  Over the past month he has developed more shortness of breath and leg swelling.  Ambulated patient in office today and he desatted to 87% on room air on ambulation.  Given significant volume overload, recommended inpatient admission for IV diuresis.  Patient politely declined and requested another option.  Ultimately decided to attempt outpatient diuresis with warning of potential inpatient diuresis if outpatient attempt failed.  Patient agreed.  Likely exacerbation worsened with concomitant flu infection.  Will start Lasix  80 mg twice daily for the next 5 days.  Patient has close follow-up on Monday, May 21, 2024 at 8:15 AM.  At that time we will assess if he needs inpatient diuresis or not.  Reviewed BMP, creatinine stable and patient should be able to diurese with current kidney function.  Patient did have low magnesium .  Supplementation sent in.  Plan: - Start diuresis with Lasix  80 mg twice daily - Close follow-up in 5 days - Continue spironolactone  25 mg daily - Continue carvedilol  6.25 mg twice daily - Continue Entresto  49-51 1 tablet twice daily - Patient given warning signs to look out for, and if any worsening chest pain or shortness of breath he will need to go to the emergency department - Mag supplementation sent in   Patient seen with Dr. Mliss Lovie Libby Tobie, DO Internal Medicine Resident PGY-3     [1]  Current Outpatient Medications:    Accu-Chek Softclix Lancets lancets, Check blood sugar three times a day as instructed,  Disp: 300 each, Rfl: 11   amoxicillin  (AMOXIL ) 500 MG capsule, Take 1 capsule (500 mg total) by mouth every 8 (eight) hours for 7 days, Disp: 21 capsule, Rfl: 0   apixaban  (ELIQUIS ) 5 MG TABS tablet, Take 1 tablet (5 mg total) by mouth 2 (two) times daily., Disp: 60 tablet, Rfl: 11   atorvastatin  (LIPITOR) 40 MG tablet, Take 1 tablet (40 mg total) by mouth every evening., Disp: 30 tablet, Rfl: 6   carvedilol  (COREG ) 6.25 MG tablet, Take 1 tablet (6.25 mg total) by mouth 2 (two) times daily with a meal., Disp: 60 tablet, Rfl: 11   cephALEXin  (KEFLEX ) 500 MG capsule, Take 1 capsule (500 mg total) by mouth in the morning, at noon, in the evening, and at bedtime., Disp: 40 capsule, Rfl: 0   clotrimazole  (CLOTRIMAZOLE  ANTI-FUNGAL) 1 % cream, Apply 1 Application topically 2 (two) times daily. Please Mail to the patient, Disp: 30 g, Rfl: 0  Continuous Glucose Sensor (DEXCOM G7 SENSOR) MISC, Please use to check glucose and change every 10 days, Disp: 9 each, Rfl: 3   Dexlansoprazole  (DEXILANT ) 30 MG capsule DR, Take 1 capsule (30 mg total) by mouth 2 (two) times daily. (Patient not taking: Reported on 03/15/2024), Disp: 60 capsule, Rfl: 12   DULoxetine  (CYMBALTA ) 60 MG capsule, Take 1 capsule (60 mg total) by mouth daily. Please mail, we are changing all of his medications as of 02/29/2024, Disp: 90 capsule, Rfl: 3   ezetimibe  (ZETIA ) 10 MG tablet, Take 1 tablet (10 mg total) by mouth every evening., Disp: 90 tablet, Rfl: 2   glucose blood (ACCU-CHEK GUIDE TEST) test strip, Use to check blood sugar up to 4 (four) times daily., Disp: 100 each, Rfl: 12   insulin  glargine (LANTUS  SOLOSTAR) 100 UNIT/ML Solostar Pen, Inject 40 Units into the skin daily., Disp: 15 mL, Rfl: 11   Insulin  Pen Needle (COMFORT EZ PEN NEEDLES) 31G X 6 MM MISC, Please use 1 needle to inject your insulin . Never use the same needle twice, Disp: 100 each, Rfl: 3   Lancets MISC, Use to test Blood Sugars 3 (three) times per day with meals and  as needed, Disp: 360 each, Rfl: 3   mirabegron  ER (MYRBETRIQ ) 25 MG TB24 tablet, Take 2 tablets (50 mg total) by mouth 1 hour prior to bedtime daily., Disp: 180 tablet, Rfl: 1   oxybutynin  (DITROPAN  XL) 15 MG 24 hr tablet, Take one tablet (15 mg dose) by mouth daily., Disp: 90 tablet, Rfl: 3   sacubitril -valsartan  (ENTRESTO ) 49-51 MG, Take 1 tablet by mouth 2 (two) times daily., Disp: 60 tablet, Rfl: 11   spironolactone  (ALDACTONE ) 25 MG tablet, Take 1 tablet (25 mg total) by mouth daily. (Patient not taking: Reported on 03/15/2024), Disp: 30 tablet, Rfl: 11   torsemide  (DEMADEX ) 20 MG tablet, Please take 1 tablet if you gain more than 3 pounds in 24 hours or more than 5 pounds in 1 week (Patient not taking: Reported on 03/15/2024), Disp: 30 tablet, Rfl: 2  "

## 2024-05-16 NOTE — Patient Instructions (Addendum)
 Terry Parks, Guthrie you for allowing me to take part in your care today.  Here are your instructions.  1.  Please start taking Lasix  80 mg by mouth twice daily.  Continue taking all of your other medications as prescribed.  I am checking labs today.  I will call you with the results.  2.  I have tested you for COVID/flu/RSV.  I will call you with the results.  3.  If you develop worsening shortness of breath, chest pain, or feel lightheaded and dizzy, please go to the emergency department.  4.  Please return on 05/21/24 at 8:15 AM.   PLEASE BRING YOUR MEDICATIONS TO EVERY APPOINTMENT  Thank you, Dr. Tobie  If you have any other questions please contact the internal medicine clinic at [phone] If it is after hours, please call the [place] hospital at [phone] and then ask the person who picks up for the resident on call.

## 2024-05-16 NOTE — Telephone Encounter (Addendum)
 Call from patient.  Has just gotten back in town.  Shortness of breath and cough continues.  Did not get the Albuterol  Inhaler.  Wants to be seen.  Refuses to go to the ER or Urgent Care.  Offered an appointment for today.  Will come in for an appointment at 10:15 this morning with Dr. Tobie.

## 2024-05-16 NOTE — Assessment & Plan Note (Addendum)
 Patient presents with 2-day history of cough.  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  He states he developed a cough afterwards.  He is unsure if he had sick contacts.  With concern for viral infection, flu, COVID, RSV swab obtained.  Patient was positive for flu.  Will start Tamiflu.  Plan: - Tamiflu provided 75 mg twice daily for 5 days

## 2024-05-18 ENCOUNTER — Other Ambulatory Visit (HOSPITAL_COMMUNITY): Payer: Self-pay

## 2024-05-21 ENCOUNTER — Other Ambulatory Visit (HOSPITAL_COMMUNITY): Payer: Self-pay

## 2024-05-21 ENCOUNTER — Ambulatory Visit (INDEPENDENT_AMBULATORY_CARE_PROVIDER_SITE_OTHER): Payer: Self-pay

## 2024-05-21 VITALS — BP 155/85 | HR 66 | Temp 98.7°F | Ht 73.0 in | Wt 295.2 lb

## 2024-05-21 DIAGNOSIS — Z7984 Long term (current) use of oral hypoglycemic drugs: Secondary | ICD-10-CM | POA: Diagnosis not present

## 2024-05-21 DIAGNOSIS — E785 Hyperlipidemia, unspecified: Secondary | ICD-10-CM

## 2024-05-21 DIAGNOSIS — I5042 Chronic combined systolic (congestive) and diastolic (congestive) heart failure: Secondary | ICD-10-CM | POA: Diagnosis not present

## 2024-05-21 DIAGNOSIS — E1165 Type 2 diabetes mellitus with hyperglycemia: Secondary | ICD-10-CM

## 2024-05-21 DIAGNOSIS — E1142 Type 2 diabetes mellitus with diabetic polyneuropathy: Secondary | ICD-10-CM

## 2024-05-21 DIAGNOSIS — Z794 Long term (current) use of insulin: Secondary | ICD-10-CM | POA: Diagnosis not present

## 2024-05-21 DIAGNOSIS — E1169 Type 2 diabetes mellitus with other specified complication: Secondary | ICD-10-CM | POA: Diagnosis not present

## 2024-05-21 LAB — POCT GLYCOSYLATED HEMOGLOBIN (HGB A1C): HbA1c, POC (controlled diabetic range): 11.2 % — AB (ref 0.0–7.0)

## 2024-05-21 LAB — GLUCOSE, CAPILLARY: Glucose-Capillary: 180 mg/dL — ABNORMAL HIGH (ref 70–99)

## 2024-05-21 MED ORDER — LANTUS SOLOSTAR 100 UNIT/ML ~~LOC~~ SOPN
50.0000 [IU] | PEN_INJECTOR | Freq: Every day | SUBCUTANEOUS | 11 refills | Status: AC
  Filled 2024-05-21: qty 15, 30d supply, fill #0

## 2024-05-21 NOTE — Progress Notes (Signed)
 "  CC: Routine Follow Up for heart failure exacerbation after last office visit 05/16/2024  HPI:  Terry Parks is a 69 y.o. male with pertinent PMH of HFrEF (last EF 35-40% in 2020), insulin  dependent DMII complicated by peripheral neuropathy, a-fib on anticoagulation, HTN who presents for follow-up of heart failure exacerbation. Please see problem based assessment and plan for further history.  ROS  Medications: Current Outpatient Medications  Medication Instructions   Accu-Chek Softclix Lancets lancets Check blood sugar three times a day as instructed   atorvastatin  (LIPITOR) 40 mg, Oral, Every evening   carvedilol  (COREG ) 6.25 mg, Oral, 2 times daily with meals   clotrimazole  (CLOTRIMAZOLE  ANTI-FUNGAL) 1 % cream 1 Application, Topical, 2 times daily, Please Mail to the patient   Continuous Glucose Sensor (DEXCOM G7 SENSOR) MISC Please use to check glucose and change every 10 days   Dexlansoprazole  (DEXILANT ) 30 mg, Oral, 2 times daily   DULoxetine  (CYMBALTA ) 60 mg, Oral, Daily, Please mail, we are changing all of his medications as of 02/29/2024   Eliquis  5 mg, Oral, 2 times daily   ezetimibe  (ZETIA ) 10 mg, Oral, Every evening   furosemide  (LASIX ) 80 mg, Oral, 2 times daily   glucose blood (ACCU-CHEK GUIDE TEST) test strip Use to check blood sugar up to 4 (four) times daily.   guaiFENesin -dextromethorphan  (ROBITUSSIN DM) 100-10 MG/5ML syrup 5 mLs, Oral, Every 4 hours PRN   Insulin  Pen Needle (COMFORT EZ PEN NEEDLES) 31G X 6 MM MISC Please use 1 needle to inject your insulin . Never use the same needle twice   Lancets MISC Use to test Blood Sugars 3 (three) times per day with meals and as needed   Lantus  SoloStar 40 Units, Subcutaneous, Daily   Magnesium  100 mg, Oral, Daily   mirabegron  ER (MYRBETRIQ ) 25 MG TB24 tablet Take 2 tablets (50 mg total) by mouth 1 hour prior to bedtime daily.   oseltamivir  (TAMIFLU ) 75 mg, Oral, 2 times daily   oxybutynin  (DITROPAN  XL) 15 MG 24 hr  tablet Take one tablet (15 mg dose) by mouth daily.   sacubitril -valsartan  (ENTRESTO ) 49-51 MG 1 tablet, Oral, 2 times daily   spironolactone  (ALDACTONE ) 25 mg, Oral, Daily   torsemide  (DEMADEX ) 20 MG tablet Please take 1 tablet if you gain more than 3 pounds in 24 hours or more than 5 pounds in 1 week     Physical Exam:  Vitals:   05/21/24 0811 05/21/24 0849  BP: (!) 147/92 (!) 155/85  Pulse: 72 66  Temp: 98.7 F (37.1 C)   TempSrc: Oral   SpO2: 100%   Weight: 295 lb 3.2 oz (133.9 kg)   Height: 6' 1 (1.854 m)     Physical Exam Constitutional:      General: He is not in acute distress.    Appearance: He is not ill-appearing.  Cardiovascular:     Rate and Rhythm: Normal rate and regular rhythm.     Heart sounds: No murmur heard.    No friction rub. No gallop.  Pulmonary:     Effort: Pulmonary effort is normal.     Breath sounds: Rales present.     Comments: Crackles at both bases Abdominal:     General: There is distension.     Tenderness: There is no abdominal tenderness. There is no guarding.  Musculoskeletal:     Right lower leg: Edema present.     Left lower leg: Edema present.     Comments: 1+ peripheral edema  Feet:  Right foot:     Protective Sensation: 10 sites tested.  0 sites sensed.     Skin integrity: Callus present. No ulcer, blister, skin breakdown, erythema, warmth, dry skin or fissure.     Left foot:     Protective Sensation: 10 sites tested.  0 sites sensed.     Skin integrity: Callus present. No ulcer, blister, skin breakdown, erythema, warmth, dry skin or fissure.     Comments: No Proprioception of bilateral hallux Neurological:     Mental Status: He is alert.     Sensory: Sensory deficit present.       Assessment & Plan:   Assessment & Plan Chronic combined systolic and diastolic CHF (congestive heart failure) (HCC) Past medical history of nonischemic cardiomyopathy.  Echocardiogram in 2021 showed EF of 35 to 40%.  Current meds are  spironolactone  25 mg daily, carvedilol  6.25 mg twice daily, Entresto  49-51 daily, Lasix  80 mg twice daily started last appointment.  He was last seen 6 days ago at the end of the year at which time he was suspected to be in heart failure exacerbation.  He was recommended to present to the hospital for inpatient admission and IV diuresis but elected to proceed with outpatient diuresis.  Noted to desaturate to 87% with ambulation at that time. He was also flu positive at that time.  Today, he notes improvement of the symptoms.  He has lost 12 pounds since 5 days ago.  He has a mild cough and reports that his shortness of breath is improved completely. His weight from an appointment in October was 275 pounds.  I think 275 is probably somewhat close to his dry weight.  His torsemide  was changed to as needed in October of this year, and he is not taking any as he thinks that this was discontinued.  The patient has also not been weighing himself.  On physical exam he does have bilateral crackles at the bases of his lungs along with 1+ peripheral edema.  He is now saturating well on room air.  EKG performed and demonstrated premature atrial complex disease with unchanged left anterior fascicular block.  I believe that his heart failure exacerbation is most likely due to not taking a diuretic but also exacerbated by his flu symptoms.  He has not seen cardiology in over a year and is due for another echocardiogram.  -Provided with number to schedule follow-up with cardiology - Continue Lasix  80 mg twice daily until he reaches dry weight of 275 pounds, follow-up in 1 week - Continue GDMT with spironolactone  25 mg daily, carvedilol  6.25 mg twice daily, Entresto  49-51 daily - EKG performed today and demonstrated premature atrial complexes and no evidence of ischemia - BMP today to assess GFR and electrolytes - Magnesium  today - Ordered for repeat echo placed - Check proBNP - Unable to take Jardiance  due to groin  infection Uncontrolled type 2 diabetes mellitus with hyperglycemia, with long-term current use of insulin  (HCC) Currently taking 44 units of insulin  daily.  He does note severe neuropathy and he is unable to feel his feet at all.  On physical exam he has no sensation to light touch or proprioception at the big toe.  No wounds present on bilateral feet.  He does not regularly check his sugars at home because he does not have any of the sensors.  He was instructed to reach out to [facility] to try and get some sensors to be able to check his sugars and titrate  accordingly.  Does not sound like he is having any low blood sugars.  He is overall compliant per his report as he may be 2 days out of every 2 weeks.  Retinopathy screen in July 2025 showed no retinopathy. His A1C is improved to 11.2 today.  Lab Results  Component Value Date   HGBA1C 11.2 (A) 05/21/2024   HGBA1C >14.0 (A) 02/29/2024   HGBA1C >14.0 (A) 07/25/2023    - A1c today - Microalbumin/Cr ratio today -Foot exam completed -Increase insulin  to 50 units daily and recheck A1C in 3 months Hyperlipidemia associated with type 2 diabetes mellitus (HCC) Currently taking atorvastatin  40 mg daily.  Last lipid panel in 2024 with triglycerides of 285, LDL of 64.   - Recheck lipid panel today - Continue atorvastatin  40 mg daily  Orders Placed This Encounter  Procedures   Lipid Profile   Basic metabolic panel with GFR   Microalbumin / Creatinine Urine Ratio   Magnesium    Glucose, capillary   POC Hbg A1C   EKG 12-Lead   ECHOCARDIOGRAM COMPLETE    Standing Status:   Future    Expiration Date:   05/21/2025    Where should this test be performed:   [facility]    Does the patient weigh less than or greater than 250 lbs?:   Patient weighs greater than 250 lbs    Perflutren  DEFINITY  (image enhancing agent) should be administered unless hypersensitivity or allergy exist:   Administer Perflutren     Reason for exam-Echo:   CHF-Acute  Systolic  I50.21     Patient seen with Dr. MICAEL Riis Winfrey   Melvenia Morrison, MD [facility] Internal [facility] Resident PGY-1 Clinic Phone: [phone] Please contact the on call pager at [phone] for any urgent or emergent needs.  "

## 2024-05-21 NOTE — Progress Notes (Signed)
 [facility] Clinic Attending  I was physically present during the key portions of the resident provided service and participated in the medical decision making of patient's management care. I reviewed pertinent patient test results.  The assessment, diagnosis, and plan were formulated together and I agree with the documentation in the resident's note.  Lovie Clarity, MD   [phone] man with nonischemic cardiomyopathy (LVEF 35-40% in 2020) who presents with 1 month of progressive shortness of breath & leg swelling, and 1 day of increased shortness of breath and feeling ill.  He looks tired. Desatted to 87% on room air when ambulating around the office. +crackles 2+ LE edema to knees Extremities warm; mentating well  I also recommended admission to the hospital for HFrEF exacerbation. Patient fully understands the risk of not going to the hospital and does not want to be admitted. We will do close outpatient follow up.  - Increase to Lasix  80mg  BID - Improve adherence to spironolactone , which is not in his pill packs - Follow up in 4 days - Treat influenza with Tamiflu  - Strict return precautions

## 2024-05-21 NOTE — Patient Instructions (Addendum)
 Thank you, [name], for allowing us  to provide your care today. Today we discussed . . .  > Heart failure       - Continue taking the Lasix  80 mg twice daily and the rest of your heart medications that are on this sheet and your pill packs.  Please get in touch with Dr. JAYSON at the cardiologist office to schedule an appointment.  I have also ordered an echocardiogram completed.  We will have you follow-up in 1 week for your fluid overload. The phone number to call to schedule is [phone] > Diabetes       - Your A1c has improved to 11.2 today.  Keep up the good work.  Your goal is to be less than 7.  We will increase your insulin  to 50 units daily today.  Please reach out to [facility] to be able to pick up your sensors.   I have ordered the following labs for you:   Lab Orders         Lipid Profile         Basic metabolic panel with GFR         Microalbumin / Creatinine Urine Ratio         Magnesium          Glucose, capillary         POC Hbg A1C       Referrals ordered today:   Referral Orders  No referral(s) requested today      Follow up: 1 week    Remember:  Should you have any questions or concerns please call the internal medicine clinic at [phone].     Terry Parks, [facility]

## 2024-05-21 NOTE — Assessment & Plan Note (Signed)
 Currently taking atorvastatin  40 mg daily.  Last lipid panel in 2024 with triglycerides of 285, LDL of 64.   - Recheck lipid panel today - Continue atorvastatin  40 mg daily

## 2024-05-21 NOTE — Assessment & Plan Note (Addendum)
 Past medical history of nonischemic cardiomyopathy.  Echocardiogram in 2021 showed EF of 35 to 40%.  Current meds are spironolactone  25 mg daily, carvedilol  6.25 mg twice daily, Entresto  49-51 daily, Lasix  80 mg twice daily started last appointment.  He was last seen 6 days ago at the end of the year at which time he was suspected to be in heart failure exacerbation.  He was recommended to present to the hospital for inpatient admission and IV diuresis but elected to proceed with outpatient diuresis.  Noted to desaturate to 87% with ambulation at that time. He was also flu positive at that time.  Today, he notes improvement of the symptoms.  He has lost 12 pounds since 5 days ago.  He has a mild cough and reports that his shortness of breath is improved completely. His weight from an appointment in October was 275 pounds.  I think 275 is probably somewhat close to his dry weight.  His torsemide  was changed to as needed in October of this year, and he is not taking any as he thinks that this was discontinued.  The patient has also not been weighing himself.  On physical exam he does have bilateral crackles at the bases of his lungs along with 1+ peripheral edema.  He is now saturating well on room air.  EKG performed and demonstrated premature atrial complex disease with unchanged left anterior fascicular block.  I believe that his heart failure exacerbation is most likely due to not taking a diuretic but also exacerbated by his flu symptoms.  He has not seen cardiology in over a year and is due for another echocardiogram.  -Provided with number to schedule follow-up with cardiology - Continue Lasix  80 mg twice daily until he reaches dry weight of 275 pounds, follow-up in 1 week - Continue GDMT with spironolactone  25 mg daily, carvedilol  6.25 mg twice daily, Entresto  49-51 daily - EKG performed today and demonstrated premature atrial complexes and no evidence of ischemia - BMP today to assess GFR and  electrolytes - Magnesium  today - Ordered for repeat echo placed - Check proBNP - Unable to take Jardiance  due to groin infection

## 2024-05-21 NOTE — Assessment & Plan Note (Addendum)
 Currently taking 44 units of insulin  daily.  He does note severe neuropathy and he is unable to feel his feet at all.  On physical exam he has no sensation to light touch or proprioception at the big toe.  No wounds present on bilateral feet.  He does not regularly check his sugars at home because he does not have any of the sensors.  He was instructed to reach out to [facility] to try and get some sensors to be able to check his sugars and titrate accordingly.  Does not sound like he is having any low blood sugars.  He is overall compliant per his report as he may be 2 days out of every 2 weeks.  Retinopathy screen in July 2025 showed no retinopathy. His A1C is improved to 11.2 today.  Lab Results  Component Value Date   HGBA1C 11.2 (A) 05/21/2024   HGBA1C >14.0 (A) 02/29/2024   HGBA1C >14.0 (A) 07/25/2023    - A1c today - Microalbumin/Cr ratio today -Foot exam completed -Increase insulin  to 50 units daily and recheck A1C in 3 months

## 2024-05-22 LAB — LIPID PANEL
Chol/HDL Ratio: 4.6 ratio (ref 0.0–5.0)
Cholesterol, Total: 115 mg/dL (ref 100–199)
HDL: 25 mg/dL — ABNORMAL LOW
LDL Chol Calc (NIH): 67 mg/dL (ref 0–99)
Triglycerides: 125 mg/dL (ref 0–149)
VLDL Cholesterol Cal: 23 mg/dL (ref 5–40)

## 2024-05-22 LAB — PRO B NATRIURETIC PEPTIDE: NT-Pro BNP: 1059 pg/mL — ABNORMAL HIGH (ref 0–376)

## 2024-05-22 LAB — MICROALBUMIN / CREATININE URINE RATIO
Creatinine, Urine: 255.8 mg/dL
Microalb/Creat Ratio: 2060 mg/g{creat} — ABNORMAL HIGH (ref 0–29)
Microalbumin, Urine: 5270.1 ug/mL

## 2024-05-22 NOTE — Progress Notes (Signed)
 Internal Medicine Clinic Attending  I was physically present during the key portions of the resident provided service and participated in the medical decision making of patient's management care. I reviewed pertinent patient test results.  The assessment, diagnosis, and plan were formulated together and I agree with the documentation in the resident's note.  Francesco Elsie NOVAK, MD

## 2024-05-23 ENCOUNTER — Other Ambulatory Visit (HOSPITAL_COMMUNITY): Payer: Self-pay

## 2024-05-23 ENCOUNTER — Ambulatory Visit: Payer: Self-pay

## 2024-05-23 LAB — BASIC METABOLIC PANEL WITH GFR
BUN/Creatinine Ratio: 6 — ABNORMAL LOW (ref 10–24)
BUN: 8 mg/dL (ref 8–27)
CO2: 22 mmol/L (ref 20–29)
Calcium: 7.4 mg/dL — ABNORMAL LOW (ref 8.6–10.2)
Chloride: 101 mmol/L (ref 96–106)
Creatinine, Ser: 1.29 mg/dL — ABNORMAL HIGH (ref 0.76–1.27)
Glucose: 190 mg/dL — ABNORMAL HIGH (ref 70–99)
Potassium: 3.3 mmol/L — ABNORMAL LOW (ref 3.5–5.2)
Sodium: 137 mmol/L (ref 134–144)
eGFR: 61 mL/min/1.73

## 2024-05-23 LAB — MAGNESIUM: Magnesium: 1.6 mg/dL (ref 1.6–2.3)

## 2024-05-23 MED ORDER — POTASSIUM CHLORIDE CRYS ER 20 MEQ PO TBCR
40.0000 meq | EXTENDED_RELEASE_TABLET | Freq: Once | ORAL | 0 refills | Status: AC
  Filled 2024-05-23: qty 30, 15d supply, fill #0

## 2024-05-23 NOTE — Progress Notes (Signed)
 Patient called and informed of results. Creatinine stable at 1.29. K low at 3.3 and Magnesium  persistently low at 1.6. Stated that he had not been able to pick up the magnesium  as it was not ready yet. Prescription sent for 40 mEq K daily which he can take until his appointment on the 12th. States that he feels fatigued but that his shortness of breath and edema have improved. Advised to call our clinic if his symptoms started to worsen. He has not been weighing himself at home.  Also with worsening Microalbimunuria. Patient taking entresto  already and we will need to continue to monitor kidney function. Follow-up as scheduled with Dr. Tobie on 1/12.

## 2024-05-28 ENCOUNTER — Ambulatory Visit: Payer: Self-pay

## 2024-05-28 NOTE — Progress Notes (Unsigned)
 "  05/28/2024 Name: Gerrald Basu MRN: [id number] DOB: 09/25/1955  No chief complaint on file.   Keivon Garden is a 69 y.o. year old male who was referred for medication management by their primary care provider, Nooruddin, Saad, MD. They were scheduled for a face-to-face appointment today. He was also scheduled for f/u with Dr. Napoleon.   They were referred to the pharmacist by their PCP for assistance in managing complex medication management and concerns for nonadherence. PMH includes HTN, CHF (EF 35-40% in 2020), Afib, OSA, HLD, CKD3, BMI > 30.   Subjective: Patient was last seen by PCP, Dr. Nelia, on 03/07/24. At last visit, BP was 162/101 mmHg (initially 180/102 mmHg), HR 94. He reported he had not received Lantus  yet and had not been taking insulin  (though per fill hx, he has been receiving consistently). A1C on 02/29/24 was > 14%. He had also been out of his other medications due to awaiting compliance packaging from [facility] pharmacy. He previously had not been taking any of his medications for several months because he did not feel they were helping. At OV on 02/29/24, he was started on Entresto  and spironolactone , but again, had not started these yet. Jardiance  was recently discontinued due to groin infection. At pharmacy call on 03/15/24, patient reported taking medications in compliance packaging. Counseled on lifestyle interventions for DM. He was seen in office on 05/16/24 for URI (flut +) and hospitalization was recommended for suspected HFrEF exacerbation, but patient declined. Lasix  increased to 80 mg BID. At f/u on 05/21/24, condition was improved. A1C was 11.2% (down from > 14%). Instructed to schedule cardiology follow-up.  Today, patient presents in good spirits, without assistance. ***  Set up CGM? - request from [facility] or [facility]    Care Team: Primary Care Provider: Nooruddin, Saad, MD ; Next Scheduled Visit: 03/22/24 - confirmed appt today   Medication  Access/Adherence  Current Pharmacy:  [place] [facility] [address] [place] [address] Phone: [phone] Fax: [phone]  [facility] - [facility] [address] [place] [address] Phone: [phone] Fax: [phone]  [facility] - [facility] [address], [address] [place] [address] Phone: [phone] Fax: [phone]   Patient reports affordability concerns with their medications: No  - [employer] Dual Complete  Patient reports access/transportation concerns to their pharmacy: No  - Using [facility] mail order (compliance packaging)  Patient reports adherence concerns with their medications:  Yes  - went several months without taking medications due to no percieved benefit, though he has been receiving medications in compliance packs throughout that period. He had also discontinued his insulin . He has now resumed medication adherence for ~1 week, but still misses doses occasionally.   Diabetes:  Current medications: Lantus  40 units daily (in the morning) Medications tried in the past: metformin  (concerned about side effects), Ozempic  (d/c in the setting of esophagitis, chronic calcific pancreatitis on CT)  Current glucose readings: has not been monitoring blood sugars Received 1 Dexcom G7 sensor in the mail. He downloaded the app himself, tried to apply sensor and connect, but he could not get it to work. He is willing to retry if we mail out more sensors - will update Rx for larger supply. Confirms that he has glucometer and supplies to check FBG with fingersticks while he awaits new shipment of Dexcom sensors  Patient denies hypoglycemic s/sx including dizziness, shakiness, sweating. Patient reports hyperglycemic symptoms including polyuria,  polydipsia, polyphagia, nocturia, neuropathy. Denies blurred vision, except when he forgets his glasses  Current meal patterns:  Eating at least once per day - unable to describe specific meals, stating he eats whatever is available. - Drinks - drinking Powerade (1 or 2 per day), drinks water  throughout the day (6 bottles).  Current physical activity: did not discuss today  Heart Failure (EF 35-40% in 2020):  Current medications:  ACEi/ARB/ARNI: Entresto  49-51 mg BID SGLT2i: none - discontinued Jardiance  due to groin infection (02/29/24) and A1C > 14% Beta blocker: carvedilol  6.25 mg BID Mineralocorticoid Receptor Antagonist: spironolactone  25 mg daily (has not been taking, since it comes in a separate bottle, but he will restart) Diuretic regimen: torsemide  20 mg PRN weight gain/swelling (has not needed)  Current home blood pressure readings: has cuff at home - not currently checking, but willing to restart  Patient denies volume overload signs or symptoms including shortness of breath, lower extremity edema, increased use of pillows at night  Denies dizziness/lightheadedness. Continues to report issues with his balance, but this did not change after resuming his medications.  Atrial Fibrillation:  Current medications: Rate Control: carvedilol  6.25 mg BID Rhythm Control: none - amiodarone  discontinued 02/29/24 due to nonadherence, not in RVR Anticoagulation Regimen: Eliquis  5 mg BID  Hyperlipidemia/ASCVD Risk Reduction  Current lipid lowering medications: atorvastatin  40 mg daily, ezetimibe  10 mg daily Medications tried in the past:   Antiplatelet regimen: Eliquis  5 mg BID (Afib)  ASCVD History: none known Family History: Stroke in father Risk Factors: HFrEF, T2DM, HTN, HLD  Objective:  BP Readings from Last 3 Encounters:  05/21/24 (!) 155/85  05/16/24 (!) 185/99  03/07/24 (!) 162/101    Lab Results  Component Value Date   HGBA1C 11.2 (A) 05/21/2024   HGBA1C >14.0 (A) 02/29/2024   HGBA1C >14.0 (A) 07/25/2023       Latest Ref Rng & Units 05/21/2024    9:40 AM 05/16/2024   12:42 PM  02/29/2024   10:04 AM  BMP  Glucose 70 - 99 mg/dL 809  92  612   BUN 8 - 27 mg/dL 8  8  13    Creatinine 0.76 - 1.27 mg/dL 8.70  8.75  8.66   BUN/Creat Ratio 10 - 24 6  6  10    Sodium 134 - 144 mmol/L 137  143  136   Potassium 3.5 - 5.2 mmol/L 3.3  3.8  4.1   Chloride 96 - 106 mmol/L 101  106  101   CO2 20 - 29 mmol/L 22  27  20    Calcium  8.6 - 10.2 mg/dL 7.4  8.4  8.5     Lab Results  Component Value Date   CHOL 115 05/21/2024   HDL 25 (L) 05/21/2024   LDLCALC 67 05/21/2024   TRIG 125 05/21/2024   CHOLHDL 4.6 05/21/2024    Medications Reviewed Today   Medications were not reviewed in this encounter       Assessment/Plan:   Type 2 Diabetes: - Currently uncontrolled with most recent A1C of > 14% above goal <7%. Medication adherence appears improved, as he reports he is taking Lantus  40 units every morning. However he is not monitoring his blood sugars. Denies s/sx of hypoglycemia. Unable to titrate today, but will facilitate getting him a 3 mo supply of Dexcom sensors. Advised him to bring sensors and phone to appt next week for pharmacy consult and setup. Patient is not on a GLP-1RA due to c/f worsening esophagitis and evidence of  chronic calcific pancreatitis on CT scan. Can consider resuming SGLT2i once glycemic control improves, but currently not recommended with ongoing groin infection and elevated A1C. - Last UACR Jan 2026 - > 2000 mg/g - Reviewed long term cardiovascular and renal outcomes of uncontrolled blood sugar - Reviewed goal A1c, goal fasting, and goal 2 hour post prandial glucose - Reviewed hypoglycemia management plan and the rule of 15 - Reviewed dietary modifications including  utilizing the healthy plate method, limiting portion size of carbohydrate foods, increasing intake of protein and non-starchy vegetables. Counseled patient to stay hydrated with water  throughout the day. Advised him to find sugar-free alternative to daily Powerade drink. - Recommend to  continue Lantus  40 units daily  - Recommend to check glucose twice daily: fasting and 2-hr PPG with glucometer until new Dexcom sensors can be applied. Counseled patient to bring glucometer or BG log to every appointment. Advised to bring phone and Dexcom sensors with him next week for set-up and application. - Next A1C due 05/31/24     Heart Failure: - Currently appropriately managed. Patient had not yet resumed spironolactone  as instructed, but was reminded to start this medication today. He is not reporting s/sx consistent with fluid overload. He has not been monitoring his BP, but if still elevated can titrate Entresto  for additional control pending BMP.  - Reviewed appropriate blood pressure monitoring technique and reviewed goal blood pressure - Reviewed to weigh daily and when to contact cardiology with weight gain - Recommend to continue current regimen:  Current medications:  ACEi/ARB/ARNI: Entresto  49-51 mg BID SGLT2i: none - discontinued Jardiance  due to groin infection (02/29/24) and A1C > 14% Beta blocker: carvedilol  6.25 mg BID Mineralocorticoid Receptor Antagonist: spironolactone  25 mg daily  Diuretic regimen: torsemide  20 mg PRN weight gain/swelling  - Recheck BMP to monitor Scr and K at upcoming PCP appt on 03/22/24   Medication Management: - Currently strategy sufficient to maintain appropriate adherence to prescribed medication regimen. Will need to continue to work closely with [facility] pharmacy to update pill packs as needed.  - Noted that patient was prescribed twice daily PPI dexlansoprazole  in July 2024 (with 11 refills) by Dr. Wilhelmenia (GI) for esophagitis, but this was never dispensed. Patient last dispensed esomeprazole  in Feb 2024. Will discuss with PCP if this should be re-prescribed and added to next month's pill pack.   Written patient instructions provided. Patient verbalized understanding of treatment plan.   Follow Up Plan:  Pharmacist in person 04/19/24 PCP  clinic visit in 03/22/24   Lorain Baseman, PharmD [facility] [phone]   "

## 2024-06-03 ENCOUNTER — Other Ambulatory Visit: Payer: Self-pay

## 2024-06-04 ENCOUNTER — Other Ambulatory Visit: Payer: Self-pay

## 2024-06-04 NOTE — Progress Notes (Signed)
 Treatment Plan Consultation [phone] [facility] 06/04/24 No primary care provider on file. Treatment Providers Hampton Slade, DDS  Dental procedures in this visit   [phone] - OFFICE VISIT FOR OBSERVATION (DURING REGULARLY SCHEDULED HOURS) - NO OTHER SERVICES PERFORMED    HEALTH HISTORY ? Vitals:  BP Readings from Last 1 Encounters:  06/04/24 (!) 158/105    Pulse:  100 Medical history was reviewed and updated. No contraindication to care.     Medical History[1] Surgical History[2] Social History   Tobacco Use   Smoking status: Never   Smokeless tobacco: Never  Substance Use Topics   Alcohol use: Never   Family History[3] Medications Ordered Prior to Encounter[4] Medical Risk Assessment ASA GRADE: ASA 4 - Patient with severe systemic disease that is a constant threat to life  Subjective: Patient reports Pain in right leg, tooth is sharp and aggravating his tongue.  Pt presents to clinic for presentation of treatment options and treatment plan formulation. Patient came for an extraction today but his blood pressure was recorded twice and both numbers were too high to touch him.   Objective:  Optional note: N/A  Assessment: Optional note: Patient was advised to take his medicine before coming in next time. Pt has the following conditionals/diagnosis : Caries, nonrestorable  Plan: Treatment options: EXT # 6  Treatment options related to findings reviewed and all questions from patient answered.  Pt opts for treatment option(s): EXT #6. Discussion with patient to address risks and concerns.  Discussed with patient home care and hygiene recommendations. Patient expressed understanding of all that was discussed.  NV: Dr. appointment: 06/14/24 LOE   Next visit: 08/09/24 8am EXT #6  Treatment Providers Greig Metro, DA II Hampton Slade, DDS [facility] [address] [place], [place]   [address] [phone]        [1] Past Medical History: Diagnosis Date   Acid reflux    Cancer (CMS/HCC)    Depression    Diabetes mellitus    Hypertension    Hypertension associated with diabetes 08/01/2023   Qualifier: Diagnosis of  By: Lanis MD, Christopher   Neuromuscular disorder   [2] History reviewed. No pertinent surgical history. [3] Family History Problem Relation Name Age of Onset   Alzheimer's disease Mother     Cancer Mother     Hypertension Sister    [4] Current Outpatient Medications on File Prior to Visit  Medication Sig Dispense Refill   Accu-Chek Guide test strips test strip      Accu-Chek Softclix Lancets lancets      amiodarone  (PACERONE ) 200 mg tablet Take 200 mg by mouth.     amLODIPine  (NORVASC ) 5 mg tablet Take 5 mg by mouth.     apixaban  (ELIQUIS ) 5 mg tablet Take 5 mg by mouth.     apixaban  (ELIQUIS ) 5 mg tablet Take 5 mg by mouth in the morning and 5 mg in the evening.     atorvastatin  (LIPITOR) 40 mg tablet Take 40 mg by mouth.     atorvastatin  (LIPITOR) 40 mg tablet Take 40 mg by mouth.     carvediloL  (COREG ) 25 mg tablet Take 25 mg by mouth.     carvediloL  (COREG ) 6.25 mg tablet Take 6.25 mg by mouth.     clotrimazole  (LOTRIMIN ) 1 % cream Apply 1 Application topically.     Comfort EZ Pen Needles 31 gauge x 1/4 needle      dexlansoprazole  (DEXILANT ) 30 mg DR capsule  Take 30 mg by mouth.     DULoxetine  (CYMBALTA ) 60 mg DR capsule Take 60 mg by mouth.     DULoxetine  (CYMBALTA ) 60 mg DR capsule Take 60 mg by mouth.     empagliflozin  25 mg tablet Take 25 mg by mouth.     ezetimibe  (ZETIA ) 10 mg tablet Take 10 mg by mouth.     ezetimibe  (ZETIA ) 10 mg tablet Take 10 mg by mouth.     insulin  glargine (Lantus  Solostar U-100 Insulin ) 100 unit/mL (3 mL) injection Inject 40 Units under the skin.     Lantus  Solostar U-100 Insulin  100 unit/mL (3 mL) injection Inject 60 Units under the skin.     mirabegron  25 mg tablet extended  release 24 hr Take 50 mg by mouth.     mirabegron  25 mg tablet extended release 24 hr Take 50 mg by mouth.     NovoLOG  Flexpen U-100 Insulin  100 unit/mL (3 mL) injection Inject under the skin.     oxybutynin  XL (DITROPAN -XL) 15 mg 24 hr tablet Take 15 mg by mouth.     oxybutynin  XL (DITROPAN -XL) 15 mg 24 hr tablet Take 15 mg by mouth.     sacubitriL -valsartan  (ENTRESTO ) 49-51 mg tablet Take 1 tablet by mouth in the morning and 1 tablet in the evening.     sacubitriL -valsartan  (ENTRESTO ) 97-103 mg tablet Take 1 tablet by mouth.     spironolactone  (ALDACTONE ) 25 mg tablet Take 25 mg by mouth in the morning.     spironolactone  (ALDACTONE ) 50 mg tablet Take 50 mg by mouth.     torsemide  (DEMADEX ) 20 mg tablet Take 40 mg by mouth.     Zituvio  50 mg tablet Take 1 tablet by mouth 1 (one) time each day.     No current facility-administered medications on file prior to visit.

## 2024-06-06 ENCOUNTER — Inpatient Hospital Stay (HOSPITAL_COMMUNITY)
Admission: EM | Admit: 2024-06-06 | Discharge: 2024-06-12 | DRG: 291 | Disposition: A | Attending: Infectious Diseases | Admitting: Infectious Diseases

## 2024-06-06 ENCOUNTER — Encounter (HOSPITAL_COMMUNITY): Payer: Self-pay

## 2024-06-06 ENCOUNTER — Emergency Department (HOSPITAL_COMMUNITY)

## 2024-06-06 ENCOUNTER — Telehealth: Payer: Self-pay | Admitting: *Deleted

## 2024-06-06 ENCOUNTER — Other Ambulatory Visit: Payer: Self-pay

## 2024-06-06 DIAGNOSIS — Z823 Family history of stroke: Secondary | ICD-10-CM

## 2024-06-06 DIAGNOSIS — D696 Thrombocytopenia, unspecified: Secondary | ICD-10-CM | POA: Diagnosis present

## 2024-06-06 DIAGNOSIS — I4811 Longstanding persistent atrial fibrillation: Secondary | ICD-10-CM | POA: Diagnosis present

## 2024-06-06 DIAGNOSIS — N400 Enlarged prostate without lower urinary tract symptoms: Secondary | ICD-10-CM | POA: Diagnosis present

## 2024-06-06 DIAGNOSIS — E876 Hypokalemia: Secondary | ICD-10-CM | POA: Diagnosis present

## 2024-06-06 DIAGNOSIS — K2100 Gastro-esophageal reflux disease with esophagitis, without bleeding: Secondary | ICD-10-CM | POA: Diagnosis present

## 2024-06-06 DIAGNOSIS — Z6841 Body Mass Index (BMI) 40.0 and over, adult: Secondary | ICD-10-CM

## 2024-06-06 DIAGNOSIS — D631 Anemia in chronic kidney disease: Secondary | ICD-10-CM | POA: Diagnosis present

## 2024-06-06 DIAGNOSIS — Z8546 Personal history of malignant neoplasm of prostate: Secondary | ICD-10-CM

## 2024-06-06 DIAGNOSIS — Z1152 Encounter for screening for COVID-19: Secondary | ICD-10-CM

## 2024-06-06 DIAGNOSIS — Z794 Long term (current) use of insulin: Secondary | ICD-10-CM

## 2024-06-06 DIAGNOSIS — N179 Acute kidney failure, unspecified: Secondary | ICD-10-CM | POA: Diagnosis present

## 2024-06-06 DIAGNOSIS — E66813 Obesity, class 3: Secondary | ICD-10-CM | POA: Diagnosis present

## 2024-06-06 DIAGNOSIS — J45909 Unspecified asthma, uncomplicated: Secondary | ICD-10-CM | POA: Diagnosis present

## 2024-06-06 DIAGNOSIS — E1142 Type 2 diabetes mellitus with diabetic polyneuropathy: Secondary | ICD-10-CM | POA: Diagnosis present

## 2024-06-06 DIAGNOSIS — Z803 Family history of malignant neoplasm of breast: Secondary | ICD-10-CM

## 2024-06-06 DIAGNOSIS — I4819 Other persistent atrial fibrillation: Secondary | ICD-10-CM

## 2024-06-06 DIAGNOSIS — Z8249 Family history of ischemic heart disease and other diseases of the circulatory system: Secondary | ICD-10-CM

## 2024-06-06 DIAGNOSIS — R6510 Systemic inflammatory response syndrome (SIRS) of non-infectious origin without acute organ dysfunction: Secondary | ICD-10-CM | POA: Diagnosis present

## 2024-06-06 DIAGNOSIS — N1831 Chronic kidney disease, stage 3a: Secondary | ICD-10-CM | POA: Diagnosis present

## 2024-06-06 DIAGNOSIS — I130 Hypertensive heart and chronic kidney disease with heart failure and stage 1 through stage 4 chronic kidney disease, or unspecified chronic kidney disease: Principal | ICD-10-CM | POA: Diagnosis present

## 2024-06-06 DIAGNOSIS — G4733 Obstructive sleep apnea (adult) (pediatric): Secondary | ICD-10-CM | POA: Diagnosis present

## 2024-06-06 DIAGNOSIS — E785 Hyperlipidemia, unspecified: Secondary | ICD-10-CM | POA: Diagnosis present

## 2024-06-06 DIAGNOSIS — Z833 Family history of diabetes mellitus: Secondary | ICD-10-CM

## 2024-06-06 DIAGNOSIS — D509 Iron deficiency anemia, unspecified: Secondary | ICD-10-CM | POA: Diagnosis present

## 2024-06-06 DIAGNOSIS — Z888 Allergy status to other drugs, medicaments and biological substances status: Secondary | ICD-10-CM

## 2024-06-06 DIAGNOSIS — Z7901 Long term (current) use of anticoagulants: Secondary | ICD-10-CM

## 2024-06-06 DIAGNOSIS — E1165 Type 2 diabetes mellitus with hyperglycemia: Secondary | ICD-10-CM | POA: Diagnosis present

## 2024-06-06 DIAGNOSIS — I5023 Acute on chronic systolic (congestive) heart failure: Principal | ICD-10-CM | POA: Diagnosis present

## 2024-06-06 DIAGNOSIS — F32A Depression, unspecified: Secondary | ICD-10-CM | POA: Diagnosis present

## 2024-06-06 DIAGNOSIS — Z79899 Other long term (current) drug therapy: Secondary | ICD-10-CM

## 2024-06-06 DIAGNOSIS — I429 Cardiomyopathy, unspecified: Secondary | ICD-10-CM | POA: Diagnosis present

## 2024-06-06 DIAGNOSIS — E1122 Type 2 diabetes mellitus with diabetic chronic kidney disease: Secondary | ICD-10-CM | POA: Diagnosis present

## 2024-06-06 DIAGNOSIS — Z8379 Family history of other diseases of the digestive system: Secondary | ICD-10-CM

## 2024-06-06 LAB — I-STAT CHEM 8, ED
BUN: 16 mg/dL (ref 8–23)
Calcium, Ion: 1.01 mmol/L — ABNORMAL LOW (ref 1.15–1.40)
Chloride: 105 mmol/L (ref 98–111)
Creatinine, Ser: 1.5 mg/dL — ABNORMAL HIGH (ref 0.61–1.24)
Glucose, Bld: 149 mg/dL — ABNORMAL HIGH (ref 70–99)
HCT: 41 % (ref 39.0–52.0)
Hemoglobin: 13.9 g/dL (ref 13.0–17.0)
Potassium: 5 mmol/L (ref 3.5–5.1)
Sodium: 140 mmol/L (ref 135–145)
TCO2: 26 mmol/L (ref 22–32)

## 2024-06-06 LAB — RESP PANEL BY RT-PCR (RSV, FLU A&B, COVID)  RVPGX2
Influenza A by PCR: NEGATIVE
Influenza B by PCR: NEGATIVE
Resp Syncytial Virus by PCR: NEGATIVE
SARS Coronavirus 2 by RT PCR: NEGATIVE

## 2024-06-06 LAB — COMPREHENSIVE METABOLIC PANEL WITH GFR
ALT: 24 U/L (ref 0–44)
AST: 35 U/L (ref 15–41)
Albumin: 3 g/dL — ABNORMAL LOW (ref 3.5–5.0)
Alkaline Phosphatase: 95 U/L (ref 38–126)
Anion gap: 11 (ref 5–15)
BUN: 12 mg/dL (ref 8–23)
CO2: 25 mmol/L (ref 22–32)
Calcium: 7.9 mg/dL — ABNORMAL LOW (ref 8.9–10.3)
Chloride: 106 mmol/L (ref 98–111)
Creatinine, Ser: 1.36 mg/dL — ABNORMAL HIGH (ref 0.61–1.24)
GFR, Estimated: 57 mL/min — ABNORMAL LOW
Glucose, Bld: 169 mg/dL — ABNORMAL HIGH (ref 70–99)
Potassium: 3.3 mmol/L — ABNORMAL LOW (ref 3.5–5.1)
Sodium: 142 mmol/L (ref 135–145)
Total Bilirubin: 0.5 mg/dL (ref 0.0–1.2)
Total Protein: 6.2 g/dL — ABNORMAL LOW (ref 6.5–8.1)

## 2024-06-06 LAB — PRO BRAIN NATRIURETIC PEPTIDE: Pro Brain Natriuretic Peptide: 4347 pg/mL — ABNORMAL HIGH

## 2024-06-06 LAB — CBC
HCT: 40.1 % (ref 39.0–52.0)
Hemoglobin: 12.5 g/dL — ABNORMAL LOW (ref 13.0–17.0)
MCH: 26.5 pg (ref 26.0–34.0)
MCHC: 31.2 g/dL (ref 30.0–36.0)
MCV: 85 fL (ref 80.0–100.0)
Platelets: 141 K/uL — ABNORMAL LOW (ref 150–400)
RBC: 4.72 MIL/uL (ref 4.22–5.81)
RDW: 14.8 % (ref 11.5–15.5)
WBC: 5.9 K/uL (ref 4.0–10.5)
nRBC: 0 % (ref 0.0–0.2)

## 2024-06-06 LAB — TROPONIN T, HIGH SENSITIVITY
Troponin T High Sensitivity: 43 ng/L — ABNORMAL HIGH (ref 0–19)
Troponin T High Sensitivity: 43 ng/L — ABNORMAL HIGH (ref 0–19)

## 2024-06-06 LAB — CBG MONITORING, ED: Glucose-Capillary: 167 mg/dL — ABNORMAL HIGH (ref 70–99)

## 2024-06-06 LAB — MAGNESIUM: Magnesium: 1.6 mg/dL — ABNORMAL LOW (ref 1.7–2.4)

## 2024-06-06 LAB — I-STAT CG4 LACTIC ACID, ED: Lactic Acid, Venous: 1.6 mmol/L (ref 0.5–1.9)

## 2024-06-06 MED ORDER — INSULIN ASPART 100 UNIT/ML IJ SOLN
0.0000 [IU] | Freq: Three times a day (TID) | INTRAMUSCULAR | Status: DC
  Administered 2024-06-07: 2 [IU] via SUBCUTANEOUS
  Administered 2024-06-07 – 2024-06-08 (×3): 3 [IU] via SUBCUTANEOUS
  Administered 2024-06-08 – 2024-06-09 (×3): 2 [IU] via SUBCUTANEOUS
  Administered 2024-06-09: 3 [IU] via SUBCUTANEOUS
  Administered 2024-06-09: 2 [IU] via SUBCUTANEOUS
  Administered 2024-06-10: 3 [IU] via SUBCUTANEOUS
  Administered 2024-06-10: 5 [IU] via SUBCUTANEOUS
  Administered 2024-06-10: 2 [IU] via SUBCUTANEOUS
  Administered 2024-06-11 (×2): 5 [IU] via SUBCUTANEOUS
  Filled 2024-06-06 (×2): qty 1
  Filled 2024-06-06: qty 2
  Filled 2024-06-06 (×4): qty 1
  Filled 2024-06-06: qty 2
  Filled 2024-06-06 (×2): qty 1
  Filled 2024-06-06: qty 3

## 2024-06-06 MED ORDER — SENNOSIDES-DOCUSATE SODIUM 8.6-50 MG PO TABS: 1.0000 | ORAL_TABLET | Freq: Every evening | ORAL | Status: DC | PRN

## 2024-06-06 MED ORDER — DULOXETINE HCL 60 MG PO CPEP
60.0000 mg | ORAL_CAPSULE | Freq: Every day | ORAL | Status: DC
  Administered 2024-06-07 – 2024-06-12 (×6): 60 mg via ORAL
  Filled 2024-06-06 (×3): qty 1
  Filled 2024-06-06: qty 2
  Filled 2024-06-06 (×2): qty 1

## 2024-06-06 MED ORDER — EZETIMIBE 10 MG PO TABS
10.0000 mg | ORAL_TABLET | Freq: Every evening | ORAL | Status: DC
  Administered 2024-06-07 – 2024-06-11 (×5): 10 mg via ORAL
  Filled 2024-06-06 (×5): qty 1

## 2024-06-06 MED ORDER — ACETAMINOPHEN 650 MG RE SUPP: 650.0000 mg | Freq: Four times a day (QID) | RECTAL | Status: DC | PRN

## 2024-06-06 MED ORDER — POTASSIUM CHLORIDE 20 MEQ PO PACK
80.0000 meq | PACK | Freq: Once | ORAL | Status: AC
  Administered 2024-06-06: 80 meq via ORAL
  Filled 2024-06-06: qty 4

## 2024-06-06 MED ORDER — PANTOPRAZOLE SODIUM 40 MG PO TBEC
40.0000 mg | DELAYED_RELEASE_TABLET | Freq: Every day | ORAL | Status: DC
  Administered 2024-06-07 – 2024-06-12 (×6): 40 mg via ORAL
  Filled 2024-06-06 (×6): qty 1

## 2024-06-06 MED ORDER — FUROSEMIDE 10 MG/ML IJ SOLN
80.0000 mg | Freq: Once | INTRAMUSCULAR | Status: AC
  Administered 2024-06-06: 80 mg via INTRAVENOUS
  Filled 2024-06-06: qty 8

## 2024-06-06 MED ORDER — ACETAMINOPHEN 325 MG PO TABS: 650.0000 mg | ORAL_TABLET | Freq: Four times a day (QID) | ORAL | Status: DC | PRN

## 2024-06-06 MED ORDER — APIXABAN 5 MG PO TABS
5.0000 mg | ORAL_TABLET | Freq: Two times a day (BID) | ORAL | Status: DC
  Administered 2024-06-06 – 2024-06-12 (×12): 5 mg via ORAL
  Filled 2024-06-06 (×13): qty 1

## 2024-06-06 MED ORDER — ATORVASTATIN CALCIUM 40 MG PO TABS
40.0000 mg | ORAL_TABLET | Freq: Every evening | ORAL | Status: DC
  Administered 2024-06-07 – 2024-06-11 (×5): 40 mg via ORAL
  Filled 2024-06-06 (×5): qty 1

## 2024-06-06 MED ORDER — SACUBITRIL-VALSARTAN 49-51 MG PO TABS
1.0000 | ORAL_TABLET | Freq: Two times a day (BID) | ORAL | Status: DC
  Administered 2024-06-07 – 2024-06-12 (×11): 1 via ORAL
  Filled 2024-06-06 (×12): qty 1

## 2024-06-06 NOTE — H&P (Incomplete)
 " Date: 06/06/2024               Patient Name:  Terry Parks MRN: [id number]  DOB: 06-20-1955 Age / Sex: 69 y.o., male   PCP: Nelia Dirks, MD         Medical Service: Internal Medicine Teaching Service         Attending Physician: Dr. Jone Dauphin      First Contact: Sallyanne Primas, DO}    Second Contact: Dr. Lonni Africa, DO         Pager Information: First Contact Pager: [phone]   Second Contact Pager: [phone]   SUBJECTIVE   Chief Complaint: SOB, leg swelling  History of Present Illness: Terry Parks is a 69 y.o. male with PMH of of HFrEF (EF 35%),  persistent Afib on Eliquis , HTN, T2DM (on insulin ), CKD3, HLD, obesity, OSA, GERD, history of prostate cancer s/p radical prostatectomy (2019) who presents with progressive SOB and leg swelling.  Symptoms began 2 weeks ago with worsening DOE and orthopnea. He reports progressive swelling in his legs and believes swelling. He notes difficulty ambulating up stairs due to SOB and sleeps upright in a chair at times.  He reports a recent flu around 12/31, now resolved, but his fluid overload persisted. He denies chest pain, headache, nausea/vomiting, abdominal pain, and denies urinary symptoms; he reports continued urination while on diuretics. He denies changes in diet. He has not been checking weights consistently recently. He initially had some response to increased diuretics but has had significant worsening recently. Medication adherence: He uses pill packs and reports last taking his meds on Mon 1/19 (missed at least 1-2 days including diuretics and anticoagulation prior to arrival).   ED Course: Vitals: SBP 140-160s/DBP 110, sat 98-100% on RA. Labs significant for: proBNP 4,347/Cr 1.36/K 3.3 /Plt 141, Hgb 12.5/ Viral panel negative (COVID/Flu/RSV negative) Imaging: CXR mild cardiomegaly, no acute infiltrate/effusion. EKG: Afib Received: Lasix  80 mg IV x1, KCl 80 mEq   Meds:  Patient reported: Last dose taken from  pill packs on Mon 1/19 -Eliquis  5 mg twice daily (last dose 1/19) -Atorvastatin  40 mg  -Zetia  10 mg daily  -Delaxint 30 mg twice daily (not dispensed) -Cymbalta  60 mg daily  -Lantus  50 units daily  -Myrbetriq  25 mg nightly  -Oxybutynin  15 mg mg -Potassium 40 mEq  -Entresto  49-51 mg twice daily  -Spironolactone  25 mg daily  -Carvedilol  6.25 mg twice daily  -Lasix  80 mg twice daily (last dose 1/19)   Past Medical History Past Medical History:  Diagnosis Date   Abscessed tooth    top back large cavity no pain or drainage, one on bottom  pt pulled tooth 4-5 months ago, right top large hole in tooth   AKI (acute kidney injury)    Allergic rhinitis    Anemia    Anxiety    Asthma    Atrial fibrillation (HCC)    BPH (benign prostatic hypertrophy)    Massive BPH noted on cystoscopy 1/23/ 2012 by Dr. Alline.   Cancer [facility])    prostate cancer 2019   Cardiomyopathy [facility])    CHF (congestive heart failure) (HCC)    Cough 03/30/2012   Depression    Diabetes mellitus 04/08/2008   type 2   Dyspnea    Dysrhythmia 2019   Foley catheter in place 07-05-17 placed   Fracture, orbital (HCC) 2021   Right   GERD (gastroesophageal reflux disease) 2020   Headache(784.0)    hx migraines none recent   History  of esophagitis 12/16/2019   Hyperlipemia    Hypertension    Hypertensive cardiopathy 03/01/2006   2-D echocardiogram 02/01/2012 showed moderate LVH, mildly to moderately reduced left ventricular systolic function with an estimated ejection fraction of 40-45%, and diffuse hypokinesis.  A nuclear medicine stress study done 01/31/2012 showed no reversible ischemia, a small mid anterior wall fixed defect/infarct, and ejection fraction 42%.       Neck pain    Nephrolithiasis 05/29/2010   CT scan of abdomen/pelvis on 05/29/2010 showed an obstructing approximate 1-2 mm calculus at the left UVJ, and an approximate 1-2 mm left lower pole renal calculus.   Patient had continuing severe pain , and an  elevation of his serum creatinine to a value of 1.75 on 06/06/2010.  Patient underwent cystoscopy on 06/08/2010 by Dr. Alline, but attempts at retrograde pyelogram and ureteroscopy were unsucc   Numbness 01/08/2018   Obstructive sleep apnea 03/06/2008   Sleep study 03/06/08 showed severe OSA/hypopnea syndrome, with successful CPAP titration to 13 CWP using a medium ResMed Mirage Quattro full face mask with heated humidifier.    Personal history of prostate cancer 10/06/2017   Rash 04/17/2014   Renal calculus 05/29/2010   CT scan of abdomen/pelvis on 05/29/2010 showed an obstructing approximate 1-2 mm calculus at the left UVJ, and an approximate 1-2 mm left lower pole renal calculus.   Patient had continuing severe pain , and an elevation of his serum creatinine to a value of 1.75 on 06/06/2010.  The stone had apparently passed and was not seen on repeat CT 06/08/2010.   Sleep apnea    haven't use cpap in 2 years   Tooth pain 10/29/2017   Uncontrolled type 2 diabetes mellitus with hyperglycemia, with long-term current use of insulin  (HCC) 04/08/2008   GLP1 discontinued at hospital discharge 06/2022; concern about esophagitis? Delayed gastric emptying?  Intolerant to metformin      Urinary straining 11/02/2016   Past Surgical History Past Surgical History:  Procedure Laterality Date   big toe nails removed Bilateral 20 yrs ago   BIOPSY  06/23/2022   Procedure: BIOPSY;  Surgeon: San Sandor GAILS, DO;  Location: [facility];  Service: Gastroenterology;;   CARDIOVERSION N/A 06/08/2017   Procedure: CARDIOVERSION;  Surgeon: Delford Maude BROCKS, MD;  Location: [facility] ENDOSCOPY;  Service: Cardiovascular;  Laterality: N/A;   COLONOSCOPY     CYSTOSCOPY W/ RETROGRADES     ESOPHAGOGASTRODUODENOSCOPY (EGD) WITH PROPOFOL  N/A 06/23/2022   Procedure: ESOPHAGOGASTRODUODENOSCOPY (EGD) WITH PROPOFOL ;  Surgeon: San Sandor GAILS, DO;  Location: [facility] ENDOSCOPY;  Service: Gastroenterology;  Laterality: N/A;   FRACTURE SURGERY  Right 2021   5th right phalange   HEMOSTASIS CLIP PLACEMENT  06/23/2022   Procedure: HEMOSTASIS CLIP PLACEMENT;  Surgeon: San Sandor GAILS, DO;  Location: [facility];  Service: Gastroenterology;;   INCISIONAL HERNIA REPAIR N/A 03/24/2020   Procedure: LAPAROSCOPIC INCISIONAL HERNIA REPAR WITH MESH;  Surgeon: Sebastian Moles, MD;  Location: [facility] OR;  Service: General;  Laterality: N/A;   IR RADIOLOGIST EVAL & MGMT  02/02/2018   LYMPHADENECTOMY Bilateral 10/06/2017   Procedure: LYMPHADENECTOMY;  Surgeon: Carolee Sherwood JONETTA DOUGLAS, MD;  Location: [facility] ORS;  Service: Urology;  Laterality: Bilateral;   ROBOT ASSISTED LAPAROSCOPIC RADICAL PROSTATECTOMY N/A 10/06/2017   Procedure: XI ROBOTIC ASSISTED LAPAROSCOPIC RADICAL PROSTATECTOMY;  Surgeon: Carolee Sherwood JONETTA DOUGLAS, MD;  Location: [facility] ORS;  Service: Urology;  Laterality: N/A;   TRANSURETHRAL RESECTION OF BLADDER TUMOR N/A 07/13/2017   Procedure: TRANSURETHRAL RESECTION OF PROSTATE;  Surgeon: Carolee Sherwood  D III, MD;  Location: [facility] ORS;  Service: Urology;  Laterality: N/A;   Social:  Lives: alone Support: family Level of Function: independent w ADL, has walker  PCP:  Nooruddin, Saad, MD  Substances: -Tobacco: denies -Alcohol: denies -Recreational Drug: denies  Family History:  Family History  Problem Relation Age of Onset   Diabetes Mother    Ulcerative colitis Mother    Breast cancer Mother    Hypertension Father    Stroke Father    Diabetes Maternal Grandmother    Colon cancer Neg Hx    Prostate cancer Neg Hx    Heart attack Neg Hx    Esophageal cancer Neg Hx    Inflammatory bowel disease Neg Hx    Liver disease Neg Hx    Pancreatic cancer Neg Hx    Rectal cancer Neg Hx    Stomach cancer Neg Hx    Colon polyps Neg Hx      Allergies: Allergies as of 06/06/2024 - Review Complete 06/06/2024  Allergen Reaction Noted   Lisinopril  Cough 03/30/2012    Review of Systems: A complete ROS was negative except as per HPI.   OBJECTIVE:   Physical  Exam: Blood pressure (!) 160/112, pulse 94, temperature 97.9 F (36.6 C), temperature source Oral, resp. rate (!) 25, height 6' 1 (1.854 m), weight 131.5 kg, SpO2 98%.  Constitutional: well-appearing male, sitting upright, no acute distress CV: irregularly irregular rhythm, normal rate, no m/r/g appreciated Pulm: normal WOB on RA; bibasilar crackles Abd: soft, non-tender; abdominal wall edema reported/seen Ext: 2+ pitting edema bilaterally, cool feet but palpable DP pulses  Neuro: A&O x3, no focal deficits Psych: appropriate mood/affect  Labs: CBC    Component Value Date/Time   WBC 5.9 06/06/2024 1752   RBC 4.72 06/06/2024 1752   HGB 13.9 06/06/2024 1941   HGB 13.0 11/10/2018 1123   HCT 41.0 06/06/2024 1941   HCT 39.9 11/10/2018 1123   PLT 141 (L) 06/06/2024 1752   PLT 207 11/10/2018 1123   MCV 85.0 06/06/2024 1752   MCV 85 11/10/2018 1123   MCH 26.5 06/06/2024 1752   MCHC 31.2 06/06/2024 1752   RDW 14.8 06/06/2024 1752   RDW 14.6 11/10/2018 1123   LYMPHSABS 2.0 04/23/2019 1534   LYMPHSABS 1.4 02/08/2018 1054   MONOABS 0.7 04/23/2019 1534   EOSABS 0.1 04/23/2019 1534   EOSABS 0.1 02/08/2018 1054   BASOSABS 0.0 04/23/2019 1534   BASOSABS 0.0 02/08/2018 1054     CMP     Component Value Date/Time   NA 140 06/06/2024 1941   NA 137 05/21/2024 0940   K 5.0 06/06/2024 1941   CL 105 06/06/2024 1941   CO2 22 05/21/2024 0940   GLUCOSE 149 (H) 06/06/2024 1941   BUN 16 06/06/2024 1941   BUN 8 05/21/2024 0940   CREATININE 1.50 (H) 06/06/2024 1941   CREATININE 1.08 07/18/2014 1056   CALCIUM  7.4 (L) 05/21/2024 0940   PROT 6.6 02/29/2024 1004   ALBUMIN 3.3 (L) 02/29/2024 1004   AST 12 02/29/2024 1004   ALT 10 02/29/2024 1004   ALKPHOS 114 02/29/2024 1004   BILITOT 0.3 02/29/2024 1004   GFRNONAA 50 (L) 04/01/2023 1222   GFRNONAA 76 07/18/2014 1056   GFRAA 66 04/01/2020 1138   GFRAA 88 07/18/2014 1056    Imaging:  DG Chest 2 View Result Date: 06/06/2024 EXAM: 2  VIEW(S) XRAY OF THE CHEST 06/06/2024 07:47:00 PM COMPARISON: 06/22/2022 CLINICAL HISTORY: SOB FINDINGS: LUNGS AND PLEURA: No focal pulmonary  opacity. No pleural effusion. No pneumothorax. HEART AND MEDIASTINUM: Mild cardiomegaly. BONES AND SOFT TISSUES: No acute osseous abnormality. IMPRESSION: 1. No acute cardiopulmonary abnormality. 2. Mild cardiomegaly. Electronically signed by: Pinkie Pebbles MD 06/06/2024 07:50 PM EST RP Workstation: [id number]     EKG: Afib with controlled rate  ASSESSMENT & PLAN:   Assessment & Plan by Problem: Active Problems:   Uncontrolled type 2 diabetes mellitus with hyperglycemia, with long-term current use of insulin  (HCC)   Longstanding persistent atrial fibrillation (HCC)   Chronic anticoagulation   Acute on chronic HFrEF (heart failure with reduced ejection fraction) (HCC)   Steaven Wholey is a 69 y.o. person living with HFrEF (EF 35%) and AF on Eliquis  presenting with 2 weeks progressive dyspnea/orthopnea with proBNP 4347 and exam consistent with significant volume overload, admitted for acute on chronic HFrEF exacerbation requiring IV diuresis.  #Acute on chronic HFrEF (EF 35%) Wet & warm on exam. Most consistent with volume overload from acute decompensated HFrEF given progressive DOE/orthopnea, edema, bibasilar crackles, proBNP 4,347, and missed diuretics/meds. CXR without infiltrate/effusion and sat 98-100% RA makes pneumonia and primary hypoxic lung process less likely. Viral panel negative and no fever/leukocytosis. Consider contribution from AF (new compared to 1/5) and recent flu as triggers; low suspicion for PE given therapeutic anticoagulation and clinical picture dominated by anasarca, but reassess if pleuritic CP/hypoxia/tachycardia out of proportion. Lactic acid normal.  - Diurese with IV lasix  80 mg o/n, may consider BID if needed  - strict I&Os, monitor UOP - daily weights (standing if able), approx dry weight per prior notes 275 lbs -  trend BMP, replete electrolytes as needed  - goal K >4 and Mg >2 - Continue Entresto   if BP allows w/ diuresis  - Continue spironolactone  if BP allows w/ diuresis  - Hold Carvedilol  until better volume status  # Hypokalemia / Hypomagnesemia K 3.3 and Mg 1.6, likely secondary to loop diuresis. Patient has intolerance to large potassium tablets. Plan: - Replete electrolytes as needed - Recheck BMP and Mg in AM - Use packet or liquid formulations for repletion - Maintain goals: K >4, Mg >2  #CKD 3 Cr 1.36-1.5 (baseline:1.3); monitor with diuresis. Plan - Trend BMP - Avoid nephrotoxins, renally dose meds  #Uncontrolled T2DM on insulin  therapy complicated with peripheral neuropathy Last A1c 11.2. Home Lantus  50 units daily. Plan - Continue 20 U basal insulin  (adjust if PO intake poor) - Moderate SSI with meals; CBG AC/HS - Carb-controlled + 2 g Na diet  #A-fib on Eliquis  AF on EKG; rate acceptable. Plan - Telemetry - Continue Eliquis  5 mg BID - Monitor for RVR as diuresis progresses  #Thrombocytopenia / mild anemia Plt 141, Hgb 12.5 (near baseline mild anemia). Plan - Trend CBC  Chronic stable conditions: #HLD: continue atorvastatin /zetia  #GERD/esophagitis hx: pantoprazole  inpatient #Depression: continue duloxetine  #OSA/Obesity: encourage CPAP use if available  Best practice: Diet: 2 g Na VTE: DOAC IVF: None, Code: Full  Disposition planning: Prior to Admission Living Arrangement: Home, living alone Anticipated Discharge Location: Home  Dispo: Admit patient to Observation with expected length of stay less than 2 midnights.  Signed: Aleathia Purdy, MD Internal Medicine Resident  06/06/2024, 8:15 PM  On Call pager: [phone]   "

## 2024-06-06 NOTE — Telephone Encounter (Signed)
 Copied from CRM #[id number]. Topic: Clinical - Medical Advice >> Jun 06, 2024 10:58 AM Debby BROCKS wrote: Reason for CRM: Patient would like to know what are the next steps he needs to do to remove the fluid from his body (legs, feet and chest). He was unable to speak or take action for his issue due to being out of town but he has come back and has the time for it now.

## 2024-06-06 NOTE — Progress Notes (Signed)
" °   06/06/24 2124  Spiritual Encounters  Type of Visit Initial  Care provided to: Patient  Reason for visit Routine spiritual support  OnCall Visit Yes   Responded to patient's request for prayer.  "

## 2024-06-06 NOTE — ED Provider Triage Note (Signed)
 Emergency Medicine Provider Triage Evaluation Note  Terry Parks , a 69 y.o. male  was evaluated in triage.  Pt complains of swelling fluid retention shortness of breath and wheezing, history of CHF.  He has been taking his diuretic medication.  He had a 12 pound weight loss and then stopped having significant urine output he is complaining of swelling all the way up his legs and into his genitals and abdomen.  History of CHF.SABRA  Review of Systems  Positive: sob Negative: fever  Physical Exam  BP (!) 142/111 (BP Location: Left Arm)   Pulse 94   Temp 97.9 F (36.6 C) (Oral)   Resp (!) 24   Ht 6' 1 (1.854 m)   Wt 131.5 kg   SpO2 99%   BMI 38.26 kg/m  Gen:   Awake, no distress   Resp:  Normal effort  MSK:   Moves extremities without difficulty  Other:  edema  Medical Decision Making  Medically screening exam initiated at 6:07 PM.  Appropriate orders placed.  Terry Parks was informed that the remainder of the evaluation will be completed by another provider, this initial triage assessment does not replace that evaluation, and the importance of remaining in the ED until their evaluation is complete.     Arloa Chroman, PA-C 06/06/24 2233

## 2024-06-06 NOTE — ED Provider Notes (Signed)
 " [place] [facility] Provider Note   CSN: [id number] Arrival date & time: 06/06/24  1726     Patient presents with: Shortness of Breath and Leg Swelling   Terry Parks is a 69 y.o. male history of CHF with a EF of 35%, hypertension, diabetes, here presenting with shortness of breath and leg swelling.  Patient was seen in internal medicine clinic on January 5.  Patient was put on Lasix  80 mg twice daily.  Patient states that he lost some weight initially but then gained about 30 pounds over the last week or so.  Patient states that he has worsening shortness of breath with exertion and orthopnea.  He also noticed swelling that goes from his feet all the way to his lower abdomen.  Patient called the clinic was sent here for further evaluation   The history is provided by the patient.       Prior to Admission medications  Medication Sig Start Date End Date Taking? Authorizing Provider  Accu-Chek Softclix Lancets lancets Check blood sugar three times a day as instructed 05/13/22   Lou Claretta HERO, MD  apixaban  (ELIQUIS ) 5 MG TABS tablet Take 1 tablet (5 mg total) by mouth 2 (two) times daily. 02/29/24 02/23/25  Tobie Gaines, DO  atorvastatin  (LIPITOR) 40 MG tablet Take 1 tablet (40 mg total) by mouth every evening. 02/29/24   Tobie Gaines, DO  carvedilol  (COREG ) 6.25 MG tablet Take 1 tablet (6.25 mg total) by mouth 2 (two) times daily with a meal. 02/29/24   Tobie Gaines, DO  clotrimazole  (CLOTRIMAZOLE  ANTI-FUNGAL) 1 % cream Apply 1 Application topically 2 (two) times daily. Please Mail to the patient 02/29/24   Tobie Gaines, DO  Continuous Glucose Sensor (DEXCOM G7 SENSOR) MISC Please use to check glucose and change every 10 days 05/02/24   Nooruddin, Saad, MD  Dexlansoprazole  (DEXILANT ) 30 MG capsule DR Take 1 capsule (30 mg total) by mouth 2 (two) times daily. Patient not taking: Reported on 03/15/2024 11/24/22   Mansouraty, Aloha Raddle., MD   DULoxetine  (CYMBALTA ) 60 MG capsule Take 1 capsule (60 mg total) by mouth daily. Please mail, we are changing all of his medications as of 02/29/2024 02/29/24   Tobie Gaines, DO  ezetimibe  (ZETIA ) 10 MG tablet Take 1 tablet (10 mg total) by mouth every evening. 02/29/24   Tobie Gaines, DO  furosemide  (LASIX ) 80 MG tablet Take 1 tablet (80 mg total) by mouth 2 (two) times daily. 05/16/24 05/22/24  Tobie Gaines, DO  glucose blood (ACCU-CHEK GUIDE TEST) test strip Use to check blood sugar up to 4 (four) times daily. 02/29/24   Tobie Gaines, DO  guaiFENesin -dextromethorphan  (ROBITUSSIN DM) 100-10 MG/5ML syrup Take 5 mLs by mouth every 4 (four) hours as needed for cough. 05/16/24   Tobie Gaines, DO  insulin  glargine (LANTUS  SOLOSTAR) 100 UNIT/ML Solostar Pen Inject 50 Units into the skin daily. 05/21/24   Smucker, Melvenia, MD  Insulin  Pen Needle (COMFORT EZ PEN NEEDLES) 31G X 6 MM MISC Please use 1 needle to inject your insulin . Never use the same needle twice 02/29/24   Tobie Gaines, DO  Lancets MISC Use to test Blood Sugars 3 (three) times per day with meals and as needed 02/29/24   Tobie Gaines, DO  Magnesium  100 MG CAPS Take 1 capsule (100 mg total) by mouth daily at 12 noon. 05/16/24   Tobie Gaines, DO  mirabegron  ER (MYRBETRIQ ) 25 MG TB24 tablet Take 2 tablets (50 mg total)  by mouth 1 hour prior to bedtime daily. 02/29/24   Tobie Gaines, DO  oxybutynin  (DITROPAN  XL) 15 MG 24 hr tablet Take one tablet (15 mg dose) by mouth daily. 02/29/24   Tobie Gaines, DO  potassium chloride  SA (KLOR-CON  M) 20 MEQ tablet Take 2 tablets (40 mEq total) by mouth once a day for 15 days. 05/23/24 06/07/24  Smucker, Melvenia, MD  sacubitril -valsartan  (ENTRESTO ) 49-51 MG Take 1 tablet by mouth 2 (two) times daily. 03/01/24 03/01/25  Tobie Gaines, DO  spironolactone  (ALDACTONE ) 25 MG tablet Take 1 tablet (25 mg total) by mouth daily. 03/01/24   Tobie Gaines, DO    Allergies: Lisinopril     Review of Systems  Respiratory:  Positive for  shortness of breath.   All other systems reviewed and are negative.   Updated Vital Signs BP (!) 142/111 (BP Location: Left Arm)   Pulse 94   Temp 97.9 F (36.6 C) (Oral)   Resp (!) 24   Ht 6' 1 (1.854 m)   Wt 131.5 kg   SpO2 99%   BMI 38.26 kg/m   Physical Exam Vitals and nursing note reviewed.  Constitutional:      Appearance: He is well-developed.  HENT:     Head: Normocephalic.     Mouth/Throat:     Mouth: Mucous membranes are moist.  Eyes:     Extraocular Movements: Extraocular movements intact.     Pupils: Pupils are equal, round, and reactive to light.  Cardiovascular:     Rate and Rhythm: Normal rate and regular rhythm.  Pulmonary:     Comments: Crackles bilateral bases Abdominal:     Palpations: Abdomen is soft.     Comments: Edema in the lower abdomen  Musculoskeletal:     Cervical back: Normal range of motion and neck supple.     Comments: 2+ edema bilaterally   Skin:    General: Skin is warm.     Capillary Refill: Capillary refill takes less than 2 seconds.  Neurological:     General: No focal deficit present.     Mental Status: He is alert and oriented to person, place, and time.  Psychiatric:        Mood and Affect: Mood normal.        Behavior: Behavior normal.     (all labs ordered are listed, but only abnormal results are displayed) Labs Reviewed  RESP PANEL BY RT-PCR (RSV, FLU A&B, COVID)  [id number]  CBC  COMPREHENSIVE METABOLIC PANEL WITH GFR  PRO BRAIN NATRIURETIC PEPTIDE  I-STAT CHEM 8, ED  TROPONIN T, HIGH SENSITIVITY    EKG: EKG Interpretation Date/Time:  Wednesday June 06 2024 18:00:48 EST Ventricular Rate:  93 PR Interval:    QRS Duration:  92 QT Interval:  324 QTC Calculation: 402 R Axis:   -60  Text Interpretation: Atrial fibrillation Low voltage QRS Left anterior fascicular block Nonspecific T wave abnormality Abnormal ECG When compared with ECG of 21-May-2024 09:08, PREVIOUS ECG IS PRESENT afib new sinc previous  Confirmed by Patt Alm DEL [phone]) on 06/06/2024 6:32:09 PM  Radiology: No results found.   Procedures   Medications Ordered in the ED - No data to display                                  Medical Decision Making Yobani Schertzer is a 69 y.o. male here with shortness of breath.  Likely CHF exacerbation.  Patient has known CHF with a EF of 35%.  Patient is already taking Lasix .  Plan to get CBC CMP and BNP and chest x-ray.  Patient likely will need admission for diuresis  8:06 PM Patient's chest x-ray showed cardiomegaly.  Creatinine is 1.5 and patient was given 80 mg of IV Lasix .  Internal medicine to admit for CHF exacerbation  Problems Addressed: Acute on chronic systolic congestive heart failure (HCC): acute illness or injury  Amount and/or Complexity of Data Reviewed Labs: ordered. Decision-making details documented in ED Course. Radiology: ordered and independent interpretation performed. Decision-making details documented in ED Course.  Risk Prescription drug management. Decision regarding hospitalization.     Final diagnoses:  None    ED Discharge Orders     None          Patt Alm Macho, MD 06/06/24 2026  "

## 2024-06-06 NOTE — ED Triage Notes (Signed)
 Pt arrives via POV. Pt c/o ongoing sob and swelling to bilateral lower extremities. Hx of CHF. Reports he did run out of his dieretics. Pt AxOx4. Denies chest pain

## 2024-06-06 NOTE — Hospital Course (Addendum)
#  Acute on chronic HFrEF (EF 35%) Wet & warm on admission exam. Most consistent with volume overload from acute decompensated HFrEF given progressive DOE/orthopnea, edema, bibasilar crackles, proBNP 4,347, and missed diuretics/meds. CXR without infiltrate/effusion and sat 98-100% RA makes pneumonia and primary hypoxic lung process less likely. Viral panel negative and no fever/leukocytosis. onset AF (new compared to 1/5) and recent flu as triggers; low suspicion for PE given therapeutic anticoagulation and clinical picture dominated by anasarca Lactic acid normal. Diuresed with IV lasix , monitored I/O and daily weights, and electrolytes repleted. Home medications restarted (Entresto , spironolactone , and coreg ).     #CKD 3 Cr on admission 1.36-1.5 (baseline:1.3); monitored with diuresis.   #Uncontrolled T2DM on insulin  therapy complicated with peripheral neuropathy Last A1c 11.2. Home Lantus  50 units daily. Continued 20 U basal insulin  plus moderate SSI with meals; CBG AC/HS and Carb-controlled + 2 g Na diet.   #A-fib on Eliquis  AF on EKG; rate acceptable. Patient was asymptomatic, placed on telemetry, and continued Eliquis  5 mg BID   #Thrombocytopenia / mild normocytic anemia Plt 141, Hgb 12.5 (near baseline mild anemia). Iron  Panel suggestive of iron  deficiency anemia (ferritin 166, Iron  31, TIBC 269, %Sat 12). Recommended outpatient iron  supplementation.    Chronic stable conditions: #HLD: continue atorvastatin /zetia  #GERD/esophagitis hx: pantoprazole  inpatient #Depression: continue duloxetine  #OSA/Obesity: encourage CPAP use if available   __________________________________________________  Thank you for allowing us  to be part of your care. You were hospitalized for Heart Failure Exacerbation. We treated you with IV diuretics.    See the changes in your medications and management of your chronic conditions below:  *For your Heart Failure -Please continue your home medications of  Entresto , spironolactone , and carvedilol   *For your Afib -continue your eliquis   *For your Anemia  - start iron  supplementation      FOLLOW UP APPOINTMENTS: We arranged for you to follow up with your PCP at: 06/13/2024 at 1:15 pm.   Please call your PCP or our clinic if you have any questions or concerns, we may be able to help and keep you from a long and expensive emergency room wait. Our clinic and after hours phone number is [phone]. The best time to call is Monday through Friday 9 am to 4 pm but there is always someone available 24/7 if you have an emergency. If you need medication refills please notify your pharmacy one week in advance and they will send us  a request.   We are glad you are feeling better,  Sallyanne Primas Internal Medicine Inpatient Teaching Service at [facility]   1/26: Today states that he's feeling pretty good and Unna boots were removed yesterday. He does think that he continues to have a little fluid on his legs still. He states that his breathing has been okay and no issues. He is eating and drinking without issue. His lowest weight was around 275 lbs he thinks. He would feel more comfortable to diurese for one more day in the hospital, but it reassured that he is improving and headed in the right direction.

## 2024-06-06 NOTE — Telephone Encounter (Signed)
 Return pt's call. LOV - 05/21/24; he stated he had the flu now has resolved. Pt stated the fluid is building back up in his legs and in his chest. Has no energy. Stated wheezing in his chest with sob. He has not weighed himself. Stated he's ready to have something done - I advised pt to go to ER. Stated he wants to avoid the ER. Sending to the doctor.

## 2024-06-07 ENCOUNTER — Observation Stay (HOSPITAL_COMMUNITY)

## 2024-06-07 ENCOUNTER — Inpatient Hospital Stay: Payer: Self-pay | Admitting: Student

## 2024-06-07 DIAGNOSIS — D509 Iron deficiency anemia, unspecified: Secondary | ICD-10-CM | POA: Diagnosis present

## 2024-06-07 DIAGNOSIS — J45909 Unspecified asthma, uncomplicated: Secondary | ICD-10-CM | POA: Diagnosis present

## 2024-06-07 DIAGNOSIS — I5023 Acute on chronic systolic (congestive) heart failure: Secondary | ICD-10-CM | POA: Diagnosis present

## 2024-06-07 DIAGNOSIS — E669 Obesity, unspecified: Secondary | ICD-10-CM | POA: Diagnosis not present

## 2024-06-07 DIAGNOSIS — Z862 Personal history of diseases of the blood and blood-forming organs and certain disorders involving the immune mechanism: Secondary | ICD-10-CM | POA: Diagnosis not present

## 2024-06-07 DIAGNOSIS — E876 Hypokalemia: Secondary | ICD-10-CM | POA: Diagnosis present

## 2024-06-07 DIAGNOSIS — I4891 Unspecified atrial fibrillation: Secondary | ICD-10-CM

## 2024-06-07 DIAGNOSIS — I5021 Acute systolic (congestive) heart failure: Secondary | ICD-10-CM

## 2024-06-07 DIAGNOSIS — E1140 Type 2 diabetes mellitus with diabetic neuropathy, unspecified: Secondary | ICD-10-CM | POA: Diagnosis not present

## 2024-06-07 DIAGNOSIS — Z7984 Long term (current) use of oral hypoglycemic drugs: Secondary | ICD-10-CM | POA: Diagnosis not present

## 2024-06-07 DIAGNOSIS — E66813 Obesity, class 3: Secondary | ICD-10-CM | POA: Diagnosis present

## 2024-06-07 DIAGNOSIS — I4811 Longstanding persistent atrial fibrillation: Secondary | ICD-10-CM | POA: Diagnosis present

## 2024-06-07 DIAGNOSIS — Z7901 Long term (current) use of anticoagulants: Secondary | ICD-10-CM | POA: Diagnosis not present

## 2024-06-07 DIAGNOSIS — K219 Gastro-esophageal reflux disease without esophagitis: Secondary | ICD-10-CM | POA: Diagnosis not present

## 2024-06-07 DIAGNOSIS — F32A Depression, unspecified: Secondary | ICD-10-CM | POA: Diagnosis present

## 2024-06-07 DIAGNOSIS — E1142 Type 2 diabetes mellitus with diabetic polyneuropathy: Secondary | ICD-10-CM | POA: Diagnosis present

## 2024-06-07 DIAGNOSIS — R6510 Systemic inflammatory response syndrome (SIRS) of non-infectious origin without acute organ dysfunction: Secondary | ICD-10-CM | POA: Diagnosis present

## 2024-06-07 DIAGNOSIS — E1122 Type 2 diabetes mellitus with diabetic chronic kidney disease: Secondary | ICD-10-CM | POA: Diagnosis present

## 2024-06-07 DIAGNOSIS — N1831 Chronic kidney disease, stage 3a: Secondary | ICD-10-CM | POA: Diagnosis present

## 2024-06-07 DIAGNOSIS — E785 Hyperlipidemia, unspecified: Secondary | ICD-10-CM | POA: Diagnosis present

## 2024-06-07 DIAGNOSIS — Z6841 Body Mass Index (BMI) 40.0 and over, adult: Secondary | ICD-10-CM | POA: Diagnosis not present

## 2024-06-07 DIAGNOSIS — G4733 Obstructive sleep apnea (adult) (pediatric): Secondary | ICD-10-CM | POA: Diagnosis present

## 2024-06-07 DIAGNOSIS — N179 Acute kidney failure, unspecified: Secondary | ICD-10-CM | POA: Diagnosis present

## 2024-06-07 DIAGNOSIS — D649 Anemia, unspecified: Secondary | ICD-10-CM | POA: Diagnosis not present

## 2024-06-07 DIAGNOSIS — I130 Hypertensive heart and chronic kidney disease with heart failure and stage 1 through stage 4 chronic kidney disease, or unspecified chronic kidney disease: Secondary | ICD-10-CM

## 2024-06-07 DIAGNOSIS — E1165 Type 2 diabetes mellitus with hyperglycemia: Secondary | ICD-10-CM | POA: Diagnosis present

## 2024-06-07 DIAGNOSIS — Z794 Long term (current) use of insulin: Secondary | ICD-10-CM | POA: Diagnosis not present

## 2024-06-07 DIAGNOSIS — Z1152 Encounter for screening for COVID-19: Secondary | ICD-10-CM | POA: Diagnosis not present

## 2024-06-07 DIAGNOSIS — N183 Chronic kidney disease, stage 3 unspecified: Secondary | ICD-10-CM | POA: Diagnosis not present

## 2024-06-07 DIAGNOSIS — D631 Anemia in chronic kidney disease: Secondary | ICD-10-CM | POA: Diagnosis present

## 2024-06-07 DIAGNOSIS — D696 Thrombocytopenia, unspecified: Secondary | ICD-10-CM | POA: Diagnosis present

## 2024-06-07 DIAGNOSIS — I429 Cardiomyopathy, unspecified: Secondary | ICD-10-CM | POA: Diagnosis present

## 2024-06-07 LAB — BASIC METABOLIC PANEL WITH GFR
Anion gap: 12 (ref 5–15)
Anion gap: 11 (ref 5–15)
BUN: 12 mg/dL (ref 8–23)
BUN: 11 mg/dL (ref 8–23)
CO2: 26 mmol/L (ref 22–32)
CO2: 24 mmol/L (ref 22–32)
Calcium: 8.1 mg/dL — ABNORMAL LOW (ref 8.9–10.3)
Calcium: 7.9 mg/dL — ABNORMAL LOW (ref 8.9–10.3)
Chloride: 104 mmol/L (ref 98–111)
Chloride: 104 mmol/L (ref 98–111)
Creatinine, Ser: 1.39 mg/dL — ABNORMAL HIGH (ref 0.61–1.24)
Creatinine, Ser: 1.36 mg/dL — ABNORMAL HIGH (ref 0.61–1.24)
GFR, Estimated: 56 mL/min — ABNORMAL LOW
GFR, Estimated: 57 mL/min — ABNORMAL LOW
Glucose, Bld: 201 mg/dL — ABNORMAL HIGH (ref 70–99)
Glucose, Bld: 189 mg/dL — ABNORMAL HIGH (ref 70–99)
Potassium: 3.2 mmol/L — ABNORMAL LOW (ref 3.5–5.1)
Potassium: 3.5 mmol/L (ref 3.5–5.1)
Sodium: 142 mmol/L (ref 135–145)
Sodium: 139 mmol/L (ref 135–145)

## 2024-06-07 LAB — GLUCOSE, CAPILLARY
Glucose-Capillary: 160 mg/dL — ABNORMAL HIGH (ref 70–99)
Glucose-Capillary: 191 mg/dL — ABNORMAL HIGH (ref 70–99)
Glucose-Capillary: 188 mg/dL — ABNORMAL HIGH (ref 70–99)
Glucose-Capillary: 143 mg/dL — ABNORMAL HIGH (ref 70–99)
Glucose-Capillary: 182 mg/dL — ABNORMAL HIGH (ref 70–99)

## 2024-06-07 LAB — ECHOCARDIOGRAM COMPLETE
AR max vel: 3.09 cm2
AV Area VTI: 3.05 cm2
AV Area mean vel: 2.93 cm2
AV Mean grad: 2 mmHg
AV Peak grad: 3.5 mmHg
Ao pk vel: 0.93 m/s
Area-P 1/2: 5.97 cm2
Height: 73 in
MV M vel: 1.64 m/s
MV Peak grad: 10.8 mmHg
S' Lateral: 4.1 cm
Weight: 4966.52 [oz_av]

## 2024-06-07 LAB — IRON AND TIBC
Iron: 31 ug/dL — ABNORMAL LOW (ref 45–182)
Saturation Ratios: 12 % — ABNORMAL LOW (ref 17.9–39.5)
TIBC: 269 ug/dL (ref 250–450)
UIBC: 238 ug/dL

## 2024-06-07 LAB — CBC
HCT: 37.1 % — ABNORMAL LOW (ref 39.0–52.0)
Hemoglobin: 11.7 g/dL — ABNORMAL LOW (ref 13.0–17.0)
MCH: 26.4 pg (ref 26.0–34.0)
MCHC: 31.5 g/dL (ref 30.0–36.0)
MCV: 83.7 fL (ref 80.0–100.0)
Platelets: 130 K/uL — ABNORMAL LOW (ref 150–400)
RBC: 4.43 MIL/uL (ref 4.22–5.81)
RDW: 14.9 % (ref 11.5–15.5)
WBC: 5.8 K/uL (ref 4.0–10.5)
nRBC: 0 % (ref 0.0–0.2)

## 2024-06-07 LAB — HIV ANTIBODY (ROUTINE TESTING W REFLEX): HIV Screen 4th Generation wRfx: NONREACTIVE

## 2024-06-07 LAB — FERRITIN: Ferritin: 166 ng/mL (ref 24–336)

## 2024-06-07 LAB — MAGNESIUM: Magnesium: 1.5 mg/dL — ABNORMAL LOW (ref 1.7–2.4)

## 2024-06-07 MED ORDER — INSULIN GLARGINE 100 UNIT/ML ~~LOC~~ SOLN
20.0000 [IU] | Freq: Every day | SUBCUTANEOUS | Status: DC
  Administered 2024-06-07 – 2024-06-11 (×5): 20 [IU] via SUBCUTANEOUS
  Filled 2024-06-07 (×5): qty 0.2

## 2024-06-07 MED ORDER — PERFLUTREN LIPID MICROSPHERE
1.0000 mL | INTRAVENOUS | Status: AC | PRN
  Administered 2024-06-07: 6 mL via INTRAVENOUS
  Filled 2024-06-07: qty 10

## 2024-06-07 MED ORDER — POTASSIUM CHLORIDE 20 MEQ PO PACK: 80.0000 meq | PACK | Freq: Once | ORAL | Status: DC

## 2024-06-07 MED ORDER — CARVEDILOL 6.25 MG PO TABS
6.2500 mg | ORAL_TABLET | Freq: Two times a day (BID) | ORAL | Status: DC
  Administered 2024-06-07 – 2024-06-12 (×10): 6.25 mg via ORAL
  Filled 2024-06-07 (×10): qty 1

## 2024-06-07 MED ORDER — SPIRONOLACTONE 25 MG PO TABS
25.0000 mg | ORAL_TABLET | Freq: Every day | ORAL | Status: DC
  Administered 2024-06-07 – 2024-06-12 (×6): 25 mg via ORAL
  Filled 2024-06-07 (×6): qty 1

## 2024-06-07 MED ORDER — METHOCARBAMOL 500 MG PO TABS
500.0000 mg | ORAL_TABLET | Freq: Once | ORAL | Status: AC
  Administered 2024-06-07: 500 mg via ORAL
  Filled 2024-06-07: qty 1

## 2024-06-07 MED ORDER — FUROSEMIDE 10 MG/ML IJ SOLN
80.0000 mg | Freq: Two times a day (BID) | INTRAMUSCULAR | Status: DC
  Administered 2024-06-07 (×2): 80 mg via INTRAVENOUS
  Filled 2024-06-07 (×3): qty 8

## 2024-06-07 MED ORDER — POTASSIUM CHLORIDE CRYS ER 20 MEQ PO TBCR
40.0000 meq | EXTENDED_RELEASE_TABLET | Freq: Two times a day (BID) | ORAL | Status: DC
  Administered 2024-06-07 – 2024-06-11 (×8): 40 meq via ORAL
  Filled 2024-06-07 (×9): qty 2

## 2024-06-07 MED ORDER — MAGNESIUM SULFATE 4 GM/100ML IV SOLN
4.0000 g | Freq: Once | INTRAVENOUS | Status: AC
  Administered 2024-06-07: 4 g via INTRAVENOUS
  Filled 2024-06-07: qty 100

## 2024-06-07 NOTE — TOC Initial Note (Signed)
 Transition of Care [facility]) - Initial/Assessment Note    Patient Details  Name: Terry Parks MRN: [id number] Date of Birth: 1955-11-25  Transition of Care [facility]) CM/SW Contact:    Sudie Erminio Deems, RN Phone Number: 06/07/2024, 7:48 PM  Clinical Narrative:  Patient presented for shortness of breath and lower extremity edema. PTA patient was from home alone. Patient states he has family support of son. Patient has DME: CPAP, however, he does not use it. Patient states [facility] and he has no issues with transportation. Patient states he is trying to find another personal care agency via Medicaid. He had services via [facility] in the past and had to cancel services. ICM spoke with patient regarding HH services and patient is agreeable to [facility] RN-CHF Management/PT/Aide. Patient did not have an agency preference and clinicals were  submitted via the hub. ICM will make the patient aware of which agency has accepted.No DME needs recommended.  ICM will continue to follow for additional needs as the patient progresses.    Expected Discharge Plan: Home w Home Health Services Barriers to Discharge: Continued Medical Work up   Patient Goals and CMS Choice Patient states their goals for this hospitalization and ongoing recovery are:: patient wants to return home with home heatlh   Choice offered to / list presented to : Patient (Patient does not have an agency preference- Submitted via the hub)      Expected Discharge Plan and Services   Discharge Planning Services: CM Consult Post Acute Care Choice: Home Health Living arrangements for the past 2 months: Apartment                   DME Agency: NA       HH Arranged: RN, Disease Management, PT, Nurse's Aide HH Agency:  (submitted via the hub) Date HH Agency Contacted: 06/07/24 Time HH Agency Contacted: 1947 Representative spoke with at [facility] Agency: Hub  Prior Living  Arrangements/Services Living arrangements for the past 2 months: Apartment Lives with:: Self Patient language and need for interpreter reviewed:: Yes Do you feel safe going back to the place where you live?: Yes      Need for Family Participation in Patient Care: No (Comment) Care giver support system in place?: No (comment) Current home services: DME (CPAP- however, does not use it.) Criminal Activity/Legal Involvement Pertinent to Current Situation/Hospitalization: No - Comment as needed  Activities of Daily Living   ADL Screening (condition at time of admission) Independently performs ADLs?: Yes (appropriate for developmental age) Is the patient deaf or have difficulty hearing?: No Does the patient have difficulty seeing, even when wearing glasses/contacts?: No Does the patient have difficulty concentrating, remembering, or making decisions?: No  Permission Sought/Granted Permission sought to share information with : Family Supports, Case Estate Manager/land Agent granted to share information with : Yes, Verbal Permission Granted     Permission granted to share info w AGENCY: Hub        Emotional Assessment Appearance:: Appears stated age Attitude/Demeanor/Rapport: Engaged Affect (typically observed): Appropriate Orientation: : Oriented to Self, Oriented to Place, Oriented to  Time, Oriented to Situation Alcohol / Substance Use: Not Applicable Psych Involvement: No (comment)  Admission diagnosis:  Acute on chronic systolic congestive heart failure (HCC) [I50.23] Acute on chronic HFrEF (heart failure with reduced ejection fraction) (HCC) [I50.23] Patient Active Problem List   Diagnosis Date Noted   Acute on chronic HFrEF (heart failure with reduced ejection  fraction) (HCC) 06/06/2024   URTI (acute upper respiratory infection) 03/08/2024   Medication management 03/08/2024   Multiple chronic diseases 03/21/2023   Psychosocial problem 08/31/2022   Chronic anticoagulation 06/23/2022    History of class 4 esophagitis with hemorrhage 06/23/2022   Recurrent abdominal hernia without obstruction or gangrene 05/22/2021   Class 3 obesity (HCC) 11/10/2020   S/P TURP 11/10/2020   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   Depression 01/31/2019   Iron  deficiency anemia 04/28/2018   CKD stage 3 due to type 2 diabetes mellitus (HCC) 02/11/2018   Healthcare maintenance 02/11/2018   Personal history of prostate cancer 10/06/2017   Longstanding persistent atrial fibrillation (HCC)    Vitamin D  deficiency 06/22/2016   Allergic rhinitis 10/19/2012   Diabetic retinopathy (HCC) 10/04/2012   Uncontrolled type 2 diabetes mellitus with hyperglycemia, with long-term current use of insulin  (HCC) 04/08/2008   Obstructive sleep apnea    Hyperlipidemia associated with type 2 diabetes mellitus (HCC)    Hypertension associated with diabetes (HCC)    Chronic combined systolic and diastolic CHF (congestive heart failure) (HCC) 03/01/2006   PCP:  Nooruddin, Saad, MD Pharmacy:   [facility] - [facility] [address], [address] [place] [address] Phone: [phone] Fax: [phone]  DARRYLE LONG - [facility] [address] [place] [address] Phone: [phone] Fax: [phone]  [facility] [address], [address] [place] [address] Phone: [phone] Fax: [phone]     Social Drivers of Health (SDOH) Social History: SDOH Screenings   Food Insecurity: No Food Insecurity (06/07/2024)  Housing: Low Risk (06/07/2024)  Transportation Needs: No Transportation Needs (06/07/2024)  Utilities: Not At Risk (06/07/2024)  Alcohol Screen: Low Risk (06/15/2022)  Depression (PHQ2-9): High Risk (05/16/2024)  Financial Resource Strain: Low Risk (07/12/2023)   Received from [facility]  Physical Activity: Sufficiently Active (08/12/2022)  Social Connections: Moderately Integrated  (06/07/2024)  Stress: No Stress Concern Present (07/15/2022)  Tobacco Use: Low Risk (06/06/2024)   SDOH Interventions:     Readmission Risk Interventions     No data to display

## 2024-06-07 NOTE — Plan of Care (Signed)

## 2024-06-07 NOTE — Progress Notes (Signed)
 Physical Therapy Evaluation Patient Details Name: Terry Parks MRN: [id number] DOB: 05/28/1955 Today's Date: 06/07/2024  History of Present Illness  Pt is a 69 yo male who presents to [facility] on 1/[facility] for progressive shortness of breath and swelling in the bilateral lower extremities. He stated worsening dyspnea on exertion and orthopnea began about 2 weeks ago. He reported having the flu around 12/31, but has had persistent fluid overload since. Pt admitted with acute on chronic HFrEF. PMH includes: CHF, HTN, Type 2 Diabetes, CKD Stage 3, OSA, Atrial fibrillation, Diabetic retinopathy, hyperlipdemia,  history of prostate cancer s/p prostatectomy (2019)  Clinical Impression  Pt presents with the conditions above and deficits below. See PT problem list. PTA, pt was currently independent with all mobility but has been struggling with iADLs such as laundry and cooking. Pt is min A to go from sit to supine and supervision from supine to sit for bed mobility. Pt is CGA for ambulating from bed to chair but displays a wide BOS when walking and maintains consistent knee flexion when ambulating. Pt is limited by ROM, activity tolerance, endurance, balance, and strength which impacts his functional mobility independence and safety. Pt is close to his baseline but at risk for falls and would likely benefit from [facility] PT to d/c home and is requesting a home health aide to assist with iADLs. Will continue to follow acutely.      If plan is discharge home, recommend the following: Assistance with cooking/housework;Direct supervision/assist for medications management;Assist for transportation;Help with stairs or ramp for entrance   Can travel by private vehicle        Equipment Recommendations None recommended by PT (Pt declined DME equipment)  Recommendations for Other Services  OT consult    Functional Status Assessment Patient has had a recent decline in their functional status and demonstrates the ability to  make significant improvements in function in a reasonable and predictable amount of time.     Precautions / Restrictions Precautions Precautions: Fall Recall of Precautions/Restrictions: Intact Restrictions Weight Bearing Restrictions Per Provider Order: No      Mobility  Bed Mobility Overal bed mobility: Needs Assistance Bed Mobility: Sit to Supine, Supine to Sit     Supine to sit: Supervision, HOB elevated, Used rails Sit to supine: Min assist, HOB elevated, Used rails   General bed mobility comments: Pt was able to transfer from EOB to supine w/ min assist. MD assisted pt in bringing RLE on the bed. Pt was able to go from supine to sit w/ supervision.    Transfers Overall transfer level: Needs assistance Equipment used: None Transfers: Bed to chair/wheelchair/BSC, Sit to/from Stand Sit to Stand: Supervision Stand pivot transfers: Contact guard assist         General transfer comment: Pt was CGA when transferring from bed to chair. Pt was supervision with sit to stand and had LOB when standing intitially with a wide BOS to stand the second time. Pt able to recover on his own without assistance.    Ambulation/Gait Ambulation/Gait assistance: Contact guard assist Gait Distance (Feet): 20 Feet Assistive device: None Gait Pattern/deviations: Wide base of support, Knee flexed in stance - right, Knee flexed in stance - left, Decreased stride length, Step-through pattern, Decreased step length - right, Decreased step length - left, Shuffle Gait velocity: Reduced Gait velocity interpretation: 1.31 - 2.62 ft/sec, indicative of limited community ambulator   General Gait Details: Pt was CGA when ambulating. Pt tends to use a wide BOS when ambulating  and displays a decreased stride length and step length bilaterally when walking. Pt tends to shuffle his feet when ambulating and maintains increase bilateral knee flexion when walking.  Stairs            Wheelchair Mobility      Tilt Bed    Modified Rankin (Stroke Patients Only)       Balance Overall balance assessment: History of Falls, Needs assistance Sitting-balance support: Feet supported, No upper extremity supported Sitting balance-Leahy Scale: Good Sitting balance - Comments: Pt was sitting at EOB upon arrival eating breakfast reaching off center of gravity without LOB.   Standing balance support: No upper extremity supported, During functional activity Standing balance-Leahy Scale: Fair Standing balance comment: Pt stands with a wide BOS and was able to adjust gown without LOB. Minor LOB with transfers but pt able to recover on his own.                             Pertinent Vitals/Pain Pain Assessment Pain Assessment: Faces Faces Pain Scale: Hurts little more Pain Location: Bilateral Lower extremity Pain Descriptors / Indicators: Discomfort, Tiring Pain Intervention(s): Limited activity within patient's tolerance, Monitored during session, RN gave pain meds during session    Home Living Family/patient expects to be discharged to:: Private residence Living Arrangements: Alone Available Help at Discharge: Neighbor;Available PRN/intermittently;Family;Friend(s) (Has neighbors that check on him frequently and are aware of where he is at all times. Son lives in [place] but is unable to be there 24/7) Type of Home: Apartment Home Access: Stairs to enter Entrance Stairs-Rails: Can reach both;Right;Left Entrance Stairs-Number of Steps: 23 Alternate Level Stairs-Number of Steps: 21 Home Layout: Two level;Bed/bath upstairs Home Equipment: None (had prior DME euqipment such as RW but did not use it that much and gave it away) Additional Comments: Pt states he typically uses R rail in his apartment to go up the stairs.    Prior Function Prior Level of Function : Driving;History of Falls (last six months);Independent/Modified Independent             Mobility Comments: Pt is  independent with mobility and does not use any AD and one fall in the past 6 months. ADLs Comments: Pt had a former PCA that came in 2hrs a day to assist with laundry and cooking but had gotten rid of the PCA due to ongoing issues with the agency. Currently, he has been managing on his own but has been struggling with laundry particularly.     Extremity/Trunk Assessment   Upper Extremity Assessment Upper Extremity Assessment: Defer to OT evaluation    Lower Extremity Assessment Lower Extremity Assessment: Generalized weakness;RLE deficits/detail;LLE deficits/detail RLE Deficits / Details: Pt had decreased dorsiflexion MMT 3/5 and good resistance plantarflexion, Knee extension MMT: 5/5. Pt had +2 pitting edema on the R. RLE Sensation: history of peripheral neuropathy RLE Coordination: decreased gross motor (Wide BOS and staggered.) LLE Deficits / Details: Pt had +1 pitting edema on the L . MMT knee extension 5/5, dorsiflexion MMT 5/5, good resistance to plantarflexion LLE Sensation: history of peripheral neuropathy LLE Coordination: decreased gross motor    Cervical / Trunk Assessment Cervical / Trunk Assessment: Normal  Communication   Communication Communication: No apparent difficulties    Cognition Arousal: Alert Behavior During Therapy: Agitated   PT - Cognitive impairments: Attention, Initiation  PT - Cognition Comments: Pt took increased time to initate transferring from bed to chair. Pt needing redirection for goal of mobility and reason for PT Following commands: Intact       Cueing Cueing Techniques: Verbal cues     General Comments General comments (skin integrity, edema, etc.): VSS on RA. O2 stats remained between 95%-99%. Educated pt on weighing himself everyday, monitoring symptoms that require followup to the doctor, and what foods are recommended for individuals using the heart failure handbook.    Exercises      Assessment/Plan    PT Assessment Patient needs continued PT services  PT Problem List Decreased range of motion;Decreased activity tolerance;Decreased balance;Decreased mobility;Decreased safety awareness;Cardiopulmonary status limiting activity;Pain;Obesity;Decreased skin integrity;Decreased strength;Decreased coordination       PT Treatment Interventions Gait training;Functional mobility training;Balance training;Neuromuscular re-education;Patient/family education;Stair training;Therapeutic activities;Therapeutic exercise;DME instruction    PT Goals (Current goals can be found in the Care Plan section)  Acute Rehab PT Goals Patient Stated Goal: to go home PT Goal Formulation: With patient Time For Goal Achievement: 06/21/24 Potential to Achieve Goals: Good    Frequency Min 1X/week     Co-evaluation               AM-PAC PT 6 Clicks Mobility  Outcome Measure Help needed turning from your back to your side while in a flat bed without using bedrails?: A Little Help needed moving from lying on your back to sitting on the side of a flat bed without using bedrails?: A Little Help needed moving to and from a bed to a chair (including a wheelchair)?: A Little Help needed standing up from a chair using your arms (e.g., wheelchair or bedside chair)?: A Little Help needed to walk in hospital room?: A Little Help needed climbing 3-5 steps with a railing? : A Little 6 Click Score: 18    End of Session   Activity Tolerance: Patient tolerated treatment well;Patient limited by pain Patient left: in chair;with call bell/phone within reach;with nursing/sitter in room (Nurse stated that chair alarm was not needed on but was set up in the chair in case it was necessary.) Nurse Communication: Mobility status PT Visit Diagnosis: Unsteadiness on feet (R26.81);Other abnormalities of gait and mobility (R26.89);History of falling (Z91.81);Muscle weakness (generalized) (M62.81);Difficulty in  walking, not elsewhere classified (R26.2);Pain Pain - Right/Left: Right (Pt has pain in bilateral lower extremeties) Pain - part of body: Leg    Time: 0832-0911 PT Time Calculation (min) (ACUTE ONLY): 39 min   Charges:   PT Evaluation $PT Eval Moderate Complexity: 1 Mod PT Treatments $Therapeutic Activity: 23-37 mins PT General Charges $$ ACUTE PT VISIT: 1 Visit         Murtis CHRISTELLA Ferries, SPT   Zyshonne Malecha 06/07/2024, 10:45 AM

## 2024-06-07 NOTE — Care Management Obs Status (Signed)
 MEDICARE OBSERVATION STATUS NOTIFICATION   Patient Details  Name: Terry Parks MRN: [id number] Date of Birth: 10/09/1955   Medicare Observation Status Notification Given:  Yes    Vonzell Arrie Sharps 06/07/2024, 9:56 AM

## 2024-06-07 NOTE — Progress Notes (Signed)
 Heart Failure Nurse Navigator Progress Note  PCP: Nooruddin, Saad, MD PCP-Cardiologist: Jerel Balding, MD Admission Diagnosis: Acute on chronic systolic congestive heart failure [facility]) Admitted from: Home  Presentation:   Terry Parks is a 69 y.o. male. He presented with shortness of breath and swelling to bilateral lower extremities.  Also had worsening dyspnea on exertion and orthopnea for 2 weeks.  He also had the flu around 05/16/25 and has had persistent fluid overload since. Gained 30 pounds for the last week or so.  PMH: CHF, HTN, Type 2 Diabetes, CKD Stage 3, OSA, Atrial fibrillation, Diabetic retinopathy, hyperlipidemia, history if prostate cancer s/p prostatectomy (2019).  Chest x-ray: Mild Cardiomegaly.  No acute cardiopulmonary abnormality. HS-Troponin- 43.  Pro-BNP-4,347.  ECHO/ LVEF: ECHO 06/06/24 -Results Pending.  Last EF 35%.   Clinical Course:  Past Medical History:  Diagnosis Date   Abscessed tooth    top back large cavity no pain or drainage, one on bottom  pt pulled tooth 4-5 months ago, right top large hole in tooth   AKI (acute kidney injury)    Allergic rhinitis    Anemia    Anxiety    Asthma    Atrial fibrillation (HCC)    BPH (benign prostatic hypertrophy)    Massive BPH noted on cystoscopy 1/23/ 2012 by Dr. Alline.   Cancer [facility])    prostate cancer 2019   Cardiomyopathy [facility])    CHF (congestive heart failure) (HCC)    Cough 03/30/2012   Depression    Diabetes mellitus 04/08/2008   type 2   Dyspnea    Dysrhythmia 2019   Foley catheter in place 07-05-17 placed   Fracture, orbital (HCC) 2021   Right   GERD (gastroesophageal reflux disease) 2020   Headache(784.0)    hx migraines none recent   History of esophagitis 12/16/2019   Hyperlipemia    Hypertension    Hypertensive cardiopathy 03/01/2006   2-D echocardiogram 02/01/2012 showed moderate LVH, mildly to moderately reduced left ventricular systolic function with an estimated ejection fraction  of 40-45%, and diffuse hypokinesis.  A nuclear medicine stress study done 01/31/2012 showed no reversible ischemia, a small mid anterior wall fixed defect/infarct, and ejection fraction 42%.       Neck pain    Nephrolithiasis 05/29/2010   CT scan of abdomen/pelvis on 05/29/2010 showed an obstructing approximate 1-2 mm calculus at the left UVJ, and an approximate 1-2 mm left lower pole renal calculus.   Patient had continuing severe pain , and an elevation of his serum creatinine to a value of 1.75 on 06/06/2010.  Patient underwent cystoscopy on 06/08/2010 by Dr. Alline, but attempts at retrograde pyelogram and ureteroscopy were unsucc   Numbness 01/08/2018   Obstructive sleep apnea 03/06/2008   Sleep study 03/06/08 showed severe OSA/hypopnea syndrome, with successful CPAP titration to 13 CWP using a medium ResMed Mirage Quattro full face mask with heated humidifier.    Personal history of prostate cancer 10/06/2017   Rash 04/17/2014   Renal calculus 05/29/2010   CT scan of abdomen/pelvis on 05/29/2010 showed an obstructing approximate 1-2 mm calculus at the left UVJ, and an approximate 1-2 mm left lower pole renal calculus.   Patient had continuing severe pain , and an elevation of his serum creatinine to a value of 1.75 on 06/06/2010.  The stone had apparently passed and was not seen on repeat CT 06/08/2010.   Sleep apnea    haven't use cpap in 2 years   Tooth pain 10/29/2017  Uncontrolled type 2 diabetes mellitus with hyperglycemia, with long-term current use of insulin  (HCC) 04/08/2008   GLP1 discontinued at hospital discharge 06/2022; concern about esophagitis? Delayed gastric emptying?  Intolerant to metformin      Urinary straining 11/02/2016     Social History   Socioeconomic History   Marital status: Single    Spouse name: Not on file   Number of children: 4   Years of education: 16   Highest education level: Not on file  Occupational History   Occupation:        Employer: UNEMPLOYED   Tobacco Use   Smoking status: Never   Smokeless tobacco: Never  Vaping Use   Vaping status: Never Used  Substance and Sexual Activity   Alcohol use: No    Alcohol/week: 0.0 standard drinks of alcohol   Drug use: No   Sexual activity: Not Currently  Other Topics Concern   Not on file  Social History Narrative   Divorced, 4 children, lives alone.     Social Drivers of Health   Tobacco Use: Low Risk (06/06/2024)   Patient History    Smoking Tobacco Use: Never    Smokeless Tobacco Use: Never    Passive Exposure: Not on file  Financial Resource Strain: Low Risk (07/12/2023)   Received from [facility]   Overall Financial Resource Strain (CARDIA)    Difficulty of Paying Living Expenses: Not hard at all  Food Insecurity: No Food Insecurity (06/07/2024)   Epic    Worried About Radiation Protection Practitioner of Food in the Last Year: Never true    Ran Out of Food in the Last Year: Never true  Transportation Needs: No Transportation Needs (06/07/2024)   Epic    Lack of Transportation (Medical): No    Lack of Transportation (Non-Medical): No  Physical Activity: Sufficiently Active (08/12/2022)   Exercise Vital Sign    Days of Exercise per Week: 7 days    Minutes of Exercise per Session: 30 min  Stress: No Stress Concern Present (07/15/2022)   [employer] of Occupational Health - Occupational Stress Questionnaire    Feeling of Stress : Only a little  Social Connections: Moderately Integrated (06/07/2024)   Social Connection and Isolation Panel    Frequency of Communication with Friends and Family: Three times a week    Frequency of Social Gatherings with Friends and Family: Twice a week    Attends Religious Services: More than 4 times per year    Active Member of Clubs or Organizations: Yes    Attends Banker Meetings: More than 4 times per year    Marital Status: Never married  Depression (PHQ2-9): High Risk (05/16/2024)   Depression (PHQ2-9)    PHQ-2 Score: 12  Alcohol Screen:  Low Risk (06/15/2022)   Alcohol Screen    Last Alcohol Screening Score (AUDIT): 0  Housing: Low Risk (06/07/2024)   Epic    Unable to Pay for Housing in the Last Year: No    Number of Times Moved in the Last Year: 0    Homeless in the Last Year: No  Utilities: Not At Risk (06/07/2024)   Epic    Threatened with loss of utilities: No  Health Literacy: Not on file   Education Assessment and Provision:  Detailed education and instructions provided on heart failure disease management including the following:  Signs and symptoms of Heart Failure When to call the physician Importance of daily weights Low sodium diet Fluid restriction Medication management Anticipated future follow-up appointments  Patient education given on each of the above topics.  Patient acknowledges understanding via teach back method and acceptance of all instructions.  Education Materials:  Living Better With Heart Failure Booklet, HF zone tool, & Daily Weight Tracker Tool.  Patient has scale at home: Yes Patient has pill box at home: Receives medications in Pill Packs.    High Risk Criteria for Readmission and/or Poor Patient Outcomes: Heart failure hospital admissions (last 6 months): 1  No Show rate: 17% Difficult social situation: None determined at this time. Demonstrates medication adherence: Yes Primary Language: English Literacy level: Reading, Writing & Comprehension  Barriers of Care:   Daily Weights Diet & Fluid Restrictions  Considerations/Referrals:  Referral made to Heart Failure Pharmacist Stewardship: Yes Referral made to Heart Failure CSW/NCM TOC: No Referral made to Heart & Vascular TOC clinic: [facility] AHF TOC 06/18/2024 @ 9:00 AM  Items for Follow-up on DC/TOC: Daily Weights-Pt has not been consistent with daily weights. Diet & Fluid Restrictions-He had a current diet high in sodium including Ramen noodles and large amounts of fluid (more than a gallon per day). Continued Heart  Failure Education  Charmaine Pines, RN, BSN [facility] Heart Failure Navigator Secure Chat Only

## 2024-06-07 NOTE — Progress Notes (Signed)
 "  Heart Failure Stewardship Pharmacist Progress Note   PCP: Terry Parks, Saad, MD PCP-Cardiologist: Terry Balding, MD    HPI:  69 year old male with PMH significant for HFrEF (last EF in 2020 35-40%), persistent atrial fibrillation (on Eliquis ), HTN, T2DM, CKD3, HLD, obesity, OSA, GERD, history of prostate cancer s/p prostatectomy (2019) who was admitted on 06/06/24 with a chief complaint of progressive shortness of breath, orthopnea, and leg swelling. He stated that his symptoms of worsening dyspnea on exertion and orthopnea began about 2 weeks ago. He reported having the flu around 12/31, but has had persistent fluid overload since. He endorsed continued urination on his PTA diuretic regimen and that he did had some response to increased diuretics but it has been worsening recently. He uses pill packs, but reported missing at least 1-2 days of therapy prior to admission. CXR showed mild cardiomegaly. He was in atrial fibrillation initially, but is now in NSR.   Patient is complaining of pain in his legs, similar to neuropathic pain. Reached out to MD to add a one-time dose of methocarbamol  for him to see if this would work, since he denied Tylenol  and reported that gabapentin  did not help him in the past. He denies SOB, CP or dyspnea on exertion. However, he is unable to lie flat and does endorse wheezing. He was lying in the chair when I walked in. We talked about adherence to his current medications, in which he stated he only missed ~1-2 doses weekly if any. He does have some of his pill packs from [facility] in his room, but his son would be able to bring the rest if he has any further medications changes for the pharmacist to help set up a pill box until Terry Parks can make him a new pill pack. Since he was compliant with his carvedilol  PTA, I spoke with the MD to get this resumed for him.   Current HF Medications: Diuretic: IV furosemide  80 mg BID ACE/ARB/ARNI: Entresto  49/51 mg BID MRA:  spironolactone  25 mg daily  Prior to admission HF Medications: Diuretic: furosemide  80 mg BID Beta blocker: carvedilol  6.25 mg BID ACE/ARB/ARNI: Entresto  49/51 mg BID MRA: spironolactone  25 mg daily  Pertinent Lab Values: Serum creatinine 1.39 (trending up; BL ~1.24), BUN 12, Potassium 3.2 (repleted with 40 mEq BID), Sodium 142, proBNP 4,347, Magnesium  1.5 (repleted with 4 grams), A1c 11.2%  Vital Signs: Weight: 310 lbs (admission weight: 312 lbs) Blood pressure: 130s-150s/100s-110s  Heart rate: 80s-104  I/O: net -1.8 L yesterday; net -2.9 L since admission  Medication Assistance / Insurance Benefits Check: Does the patient have prescription insurance?  Yes Type of insurance plan: [employer] Medicare + Medicaid  Outpatient Pharmacy:  Prior to admission outpatient pharmacy: [facility] Is the patient willing to use [facility] TOC pharmacy at discharge? Yes Is the patient willing to transition their outpatient pharmacy to utilize a [facility] outpatient pharmacy?   Yes    Assessment: 1. Acute on chronic systolic CHF (LVEF 35-40%), due to non-ischemic etiology. NYHA class III symptoms. - Lower extremity edema observed on physical exam > continue IV diuresis - K 3.2 after IV furosemide  80 mg and being diuresed with IV 80 mg BID - Last A1c 11.2% > would hold off on SGLT2i and has a history of groin infection in October 2025   Plan: 1) Medication changes recommended at this time: - Replete potassium with 40 mEq BID today - Resume PTA carvedilol  6.25 mg BID  2)  Education  -  To be completed prior to discharge  B. Terry Parks, PharmD PGY-1 Pharmacy Resident Terry [facility] 06/07/2024 9:01 AM    "

## 2024-06-07 NOTE — Progress Notes (Addendum)
 "  HD#0 SUBJECTIVE:  Patient Summary: Terry Parks is a 69 y.o. with a pertinent PMH of HFrEF (EF 35%) and AF on eliquis , who presented with significant volume overload and admitted for acute on chronic HFrEF exacerbation requiring IV diuresis.   Overnight Events: admitted overnight   Interim History: sitting on side of bed eating grapes. Patient reports improved breathing and swelling after lasix  dose. Denies CP, palpitations, lightheadedness. Endorses bilateral leg pain (pins/needles/neuropathy).   OBJECTIVE:  Vital Signs:  Vitals:   06/06/24 2300 06/07/24 0112 06/07/24 0201 06/07/24 0756  BP: (!) 113/96 (!) 163/98 (!) 134/107 (!) 144/100  Pulse: 90 100 94 98  Resp: (!) 23 20 20 18   Temp: 97.9 F (36.6 C) 98.3 F (36.8 C) 97.9 F (36.6 C) 97.7 F (36.5 C)  TempSrc: Oral Oral Oral Oral  SpO2: 96% 100% 99% 100%  Weight:   (!) 141.6 kg (!) 140.8 kg  Height:       Supplemental O2: Room Air SpO2: 100 %  Filed Weights   06/06/24 1750 06/07/24 0201 06/07/24 0756  Weight: 131.5 kg (!) 141.6 kg (!) 140.8 kg     Intake/Output Summary (Last 24 hours) at 06/07/2024 1030 Last data filed at 06/07/2024 0531 Gross per 24 hour  Intake 83.57 ml  Output 3000 ml  Net -2916.43 ml   Net IO Since Admission: -2,916.43 mL [06/07/24 1030]  Physical Exam: Physical Exam Constitutional:      General: He is not in acute distress.    Appearance: He is obese. He is not ill-appearing, toxic-appearing or diaphoretic.  HENT:     Mouth/Throat:     Mouth: Mucous membranes are moist.  Neck:     Vascular: No JVD.  Cardiovascular:     Rate and Rhythm: Normal rate and regular rhythm.     Heart sounds: No murmur heard.    No friction rub. No gallop.  Pulmonary:     Effort: Pulmonary effort is normal.     Breath sounds: Examination of the right-lower field reveals rales. Examination of the left-lower field reveals rales. Rales present.  Musculoskeletal:     Right lower leg: 1+ Edema present.      Left lower leg: 1+ Edema present.  Neurological:     Mental Status: He is alert.  Psychiatric:        Mood and Affect: Mood normal.        Behavior: Behavior normal.     Patient Lines/Drains/Airways Status     Active Line/Drains/Airways     Name Placement date Placement time Site Days   Peripheral IV 06/06/24 20 G Right Antecubital 06/06/24  1929  Antecubital  1   Closed System Drain Right Abdomen 12 Fr. 01/14/18  1257  Abdomen  2336            Pertinent labs and imaging:      Latest Ref Rng & Units 06/07/2024    1:32 AM 06/06/2024    7:41 PM 06/06/2024    5:52 PM  CBC  WBC 4.0 - 10.5 K/uL 5.8   5.9   Hemoglobin 13.0 - 17.0 g/dL 88.2  86.0  87.4   Hematocrit 39.0 - 52.0 % 37.1  41.0  40.1   Platelets 150 - 400 K/uL 130   141        Latest Ref Rng & Units 06/07/2024    1:32 AM 06/06/2024    8:21 PM 06/06/2024    7:41 PM  CMP  Glucose 70 - 99  mg/dL 798  830  850   BUN 8 - 23 mg/dL 12  12  16    Creatinine 0.61 - 1.24 mg/dL 8.60  8.63  8.49   Sodium 135 - 145 mmol/L 142  142  140   Potassium 3.5 - 5.1 mmol/L 3.2  3.3  5.0   Chloride 98 - 111 mmol/L 104  106  105   CO2 22 - 32 mmol/L 26  25    Calcium  8.9 - 10.3 mg/dL 8.1  7.9    Total Protein 6.5 - 8.1 g/dL  6.2    Total Bilirubin 0.0 - 1.2 mg/dL  0.5    Alkaline Phos 38 - 126 U/L  95    AST 15 - 41 U/L  35    ALT 0 - 44 U/L  24      DG Chest 2 View Result Date: 06/06/2024 EXAM: 2 VIEW(S) XRAY OF THE CHEST 06/06/2024 07:47:00 PM COMPARISON: 06/22/2022 CLINICAL HISTORY: SOB FINDINGS: LUNGS AND PLEURA: No focal pulmonary opacity. No pleural effusion. No pneumothorax. HEART AND MEDIASTINUM: Mild cardiomegaly. BONES AND SOFT TISSUES: No acute osseous abnormality. IMPRESSION: 1. No acute cardiopulmonary abnormality. 2. Mild cardiomegaly. Electronically signed by: Pinkie Pebbles MD 06/06/2024 07:50 PM EST RP Workstation: [id number]    ASSESSMENT/PLAN:  Assessment: Principal Problem:   Acute on chronic HFrEF  (heart failure with reduced ejection fraction) (HCC) Active Problems:   Uncontrolled type 2 diabetes mellitus with hyperglycemia, with long-term current use of insulin  (HCC)   Longstanding persistent atrial fibrillation (HCC)   Chronic anticoagulation   Plan: #Acute on chronic HFrEF (EF 35%) Presented with DOE, orthopnea, edema, and bibasilar crackles with proBNP 4,347 and having missed diuretics over past couple of days. Currently out 3 L since admission. Received 80 mg Lasix  IV + electrolyte repletion. Dry weight noted 275 lbs, weight 1/21 290 lbs, weight 1/22 312 lbs.  Patient reports improvement of breathing and swelling. Discussed importance of medications and patient stated understanding.  - Lasix  80 mg BID - strict I&Os - daily weights (standing if available) - trend BMP, replace electrolytes PRN (goal K>4, Mg >2) - restarted carvedilol , spironolactone , and Entresto     Hx Afib on Eliquis   EKG with p waves, some irregular rhythm. Rate nonsustained up to 108. Denies palpations or CP. Restarted coreg .  - telemetry  - continue eliquis  5 mg BID - monitor for RVR   #CKD 3 Cr 1.39. On admission 1.36-1.5 (baseline 1.3).  - trend BMP - Avoid nephrotoxins, renally dose meds  #Uncontrolled T2DM on insulin  therapy #Peripheral neuropathy  Last A1c 11.2. Home Lantus  50 units daily  - Lantus  20 Units  - Moderate SSI with meals, CBG AC/HS - Carb-controlled + 2g Na diet - Duloxetine  60 mg daily   #Thrombocytopenia / mild anemia  Plt 141, Hgb 12.5 (near baseline)  - stable  - Iron  studies ordered   #Hyperlipidemia  - atorvastatin  40 mg daily - ezetimibe  10 mg daily    #GERD  - Protonix  40 mg daily   Best Practice: Diet: carb controlled and 2 gram sodium restriction  VTE: apixaban  5 mg BID Code: Full  Disposition planning: Therapy Recs: None, DME: none DISPO: Anticipated discharge in 0-1 days to Home pending clinical improvement.  Signature:  Sallyanne Benuel Jolynn Davene  Internal Medicine Residency  10:30 AM, 06/07/2024  On Call pager [phone]  "

## 2024-06-08 ENCOUNTER — Telehealth (HOSPITAL_COMMUNITY): Payer: Self-pay

## 2024-06-08 ENCOUNTER — Other Ambulatory Visit (HOSPITAL_COMMUNITY): Payer: Self-pay

## 2024-06-08 DIAGNOSIS — I4811 Longstanding persistent atrial fibrillation: Secondary | ICD-10-CM | POA: Diagnosis not present

## 2024-06-08 DIAGNOSIS — I5023 Acute on chronic systolic (congestive) heart failure: Secondary | ICD-10-CM | POA: Diagnosis not present

## 2024-06-08 DIAGNOSIS — K219 Gastro-esophageal reflux disease without esophagitis: Secondary | ICD-10-CM | POA: Diagnosis not present

## 2024-06-08 DIAGNOSIS — Z794 Long term (current) use of insulin: Secondary | ICD-10-CM | POA: Diagnosis not present

## 2024-06-08 DIAGNOSIS — D696 Thrombocytopenia, unspecified: Secondary | ICD-10-CM | POA: Diagnosis not present

## 2024-06-08 DIAGNOSIS — E1122 Type 2 diabetes mellitus with diabetic chronic kidney disease: Secondary | ICD-10-CM | POA: Diagnosis not present

## 2024-06-08 DIAGNOSIS — E785 Hyperlipidemia, unspecified: Secondary | ICD-10-CM | POA: Diagnosis not present

## 2024-06-08 DIAGNOSIS — D649 Anemia, unspecified: Secondary | ICD-10-CM | POA: Diagnosis not present

## 2024-06-08 DIAGNOSIS — E1140 Type 2 diabetes mellitus with diabetic neuropathy, unspecified: Secondary | ICD-10-CM | POA: Diagnosis not present

## 2024-06-08 DIAGNOSIS — E1165 Type 2 diabetes mellitus with hyperglycemia: Secondary | ICD-10-CM | POA: Diagnosis not present

## 2024-06-08 DIAGNOSIS — N183 Chronic kidney disease, stage 3 unspecified: Secondary | ICD-10-CM | POA: Diagnosis not present

## 2024-06-08 LAB — GLUCOSE, CAPILLARY
Glucose-Capillary: 150 mg/dL — ABNORMAL HIGH (ref 70–99)
Glucose-Capillary: 165 mg/dL — ABNORMAL HIGH (ref 70–99)
Glucose-Capillary: 152 mg/dL — ABNORMAL HIGH (ref 70–99)
Glucose-Capillary: 121 mg/dL — ABNORMAL HIGH (ref 70–99)
Glucose-Capillary: 136 mg/dL — ABNORMAL HIGH (ref 70–99)

## 2024-06-08 LAB — BASIC METABOLIC PANEL WITH GFR
Anion gap: 8 (ref 5–15)
BUN: 12 mg/dL (ref 8–23)
CO2: 27 mmol/L (ref 22–32)
Calcium: 8 mg/dL — ABNORMAL LOW (ref 8.9–10.3)
Chloride: 103 mmol/L (ref 98–111)
Creatinine, Ser: 1.32 mg/dL — ABNORMAL HIGH (ref 0.61–1.24)
GFR, Estimated: 59 mL/min — ABNORMAL LOW
Glucose, Bld: 147 mg/dL — ABNORMAL HIGH (ref 70–99)
Potassium: 3.7 mmol/L (ref 3.5–5.1)
Sodium: 138 mmol/L (ref 135–145)

## 2024-06-08 LAB — MAGNESIUM: Magnesium: 1.6 mg/dL — ABNORMAL LOW (ref 1.7–2.4)

## 2024-06-08 MED ORDER — POTASSIUM CHLORIDE CRYS ER 20 MEQ PO TBCR
40.0000 meq | EXTENDED_RELEASE_TABLET | Freq: Once | ORAL | Status: AC
  Administered 2024-06-08: 40 meq via ORAL
  Filled 2024-06-08: qty 2

## 2024-06-08 MED ORDER — FUROSEMIDE 10 MG/ML IJ SOLN
160.0000 mg | Freq: Once | INTRAVENOUS | Status: DC
  Filled 2024-06-08: qty 16

## 2024-06-08 MED ORDER — MAGNESIUM SULFATE 2 GM/50ML IV SOLN
2.0000 g | Freq: Once | INTRAVENOUS | Status: AC
  Administered 2024-06-08: 2 g via INTRAVENOUS
  Filled 2024-06-08: qty 50

## 2024-06-08 MED ORDER — METOLAZONE 5 MG PO TABS
5.0000 mg | ORAL_TABLET | Freq: Once | ORAL | Status: AC
  Administered 2024-06-08: 5 mg via ORAL
  Filled 2024-06-08: qty 1

## 2024-06-08 MED ORDER — FUROSEMIDE 10 MG/ML IJ SOLN
120.0000 mg | Freq: Once | INTRAVENOUS | Status: AC
  Administered 2024-06-08: 120 mg via INTRAVENOUS
  Filled 2024-06-08: qty 2

## 2024-06-08 NOTE — Plan of Care (Signed)

## 2024-06-08 NOTE — Progress Notes (Signed)
 Sent message to Dr. Franky to read Philipp. X-ray.

## 2024-06-08 NOTE — Progress Notes (Signed)
" °  Progress Note   Date: 06/08/2024  Patient Name: Terry Parks        MRN#: [id number]   Clarification of diagnosis:   Obesity class III      "

## 2024-06-08 NOTE — TOC Progression Note (Addendum)
 Transition of Care (TOC) - Progression Note  Rayfield Gobble RN, BSN Inpatient Care Management Unit 4E- RN Case Manager See Treatment Team for direct phone #   Patient Details  Name: Terry Parks MRN: [id number] Date of Birth: 10-22-1955  Transition of Care [facility]) CM/SW Contact  Gobble Rayfield Hurst, RN Phone Number: 06/08/2024, 12:57 PM  Clinical Narrative:    Follow up done with pt or HH choice- pt provided names of the agencies that have responded in Hub that can accept.  [facility], [facility] and [facility].   Pt is requesting agency contact numbers as he wants to call the main offices and ask them a few questions- contact info provided for the above [facility] agencies.   ICM will follow up for final choice-   1620- follow up with pt for [facility] choice- he voiced he is still undecided- will have weekend ICM follow up  Will need MD orders for HHPT/OT/aide   Expected Discharge Plan: Home w Home Health Services Barriers to Discharge: Continued Medical Work up               Expected Discharge Plan and Services   Discharge Planning Services: CM Consult Post Acute Care Choice: Home Health Living arrangements for the past 2 months: Apartment                   DME Agency: NA       HH Arranged: RN, Disease Management, PT, Nurse's Aide HH Agency:  (submitted via the hub) Date HH Agency Contacted: 06/07/24 Time HH Agency Contacted: 1947 Representative spoke with at [facility] Agency: Hub   Social Drivers of Health (SDOH) Interventions SDOH Screenings   Food Insecurity: No Food Insecurity (06/07/2024)  Housing: Low Risk (06/07/2024)  Transportation Needs: No Transportation Needs (06/07/2024)  Utilities: Not At Risk (06/07/2024)  Alcohol Screen: Low Risk (06/15/2022)  Depression (PHQ2-9): High Risk (05/16/2024)  Financial Resource Strain: Low Risk (07/12/2023)   Received from [facility]  Physical Activity: Sufficiently Active (08/12/2022)  Social Connections: Moderately Integrated  (06/07/2024)  Stress: No Stress Concern Present (07/15/2022)  Tobacco Use: Low Risk (06/06/2024)    Readmission Risk Interventions     No data to display

## 2024-06-08 NOTE — Progress Notes (Signed)
 Orthopedic Tech Progress Note Patient Details:  Terry Parks Aug 17, 1955 [id number]  Ortho Devices Type of Ortho Device: Radio broadcast assistant Ortho Device/Splint Location: BLE Ortho Device/Splint Interventions: Ordered, Application, Adjustment   Post Interventions Patient Tolerated: Well Instructions Provided: Care of device, Adjustment of device  [name] 06/08/2024, 1:13 PM

## 2024-06-08 NOTE — Discharge Instructions (Addendum)
 Thank you for allowing us  to be part of your care. You were hospitalized for Heart Failure Exacerbation. We treated you with IV diuretics.    See the changes in your medications and management of your chronic conditions below:  *For your Heart Failure - Please continue your home medications of Entresto  and spironolactone  - INCREASE your coreg  to 12.5 mg daily  - take Lasix  40 mg DAILY until seen in the clinic  - take daily weights, keep your feet elevated, wear compression stockings.   *For your Afib -continue your eliquis   *For your Anemia  - referral will be placed for IV iron  when you are seen in the clinic.    Continue taking your other medications as prescribed.    FOLLOW UP APPOINTMENTS: You should get a call for follow up with PCP soon, If you do not hear back by the end of the week, please call the clinic.   Please call your PCP or our clinic if you have any questions or concerns, we may be able to help and keep you from a long and expensive emergency room wait. Our clinic and after hours phone number is [phone]. The best time to call is Monday through Friday 9 am to 4 pm but there is always someone available 24/7 if you have an emergency. If you need medication refills please notify your pharmacy one week in advance and they will send us  a request.   We are glad you are feeling better,  Terry Parks Internal Medicine Inpatient Teaching Service at [facility]   ___________________________ Information on my medicine - ELIQUIS  (apixaban )  This medication education was reviewed with me or my healthcare representative as part of my discharge preparation.   Why was Eliquis  prescribed for you? Eliquis  was prescribed for you to reduce the risk of a blood clot forming that can cause a stroke if you have a medical condition called atrial fibrillation (a type of irregular heartbeat).  What do You need to know about Eliquis  ? Take your Eliquis  TWICE DAILY - one tablet in  the morning and one tablet in the evening with or without food. If you have difficulty swallowing the tablet whole please discuss with your pharmacist how to take the medication safely.  Take Eliquis  exactly as prescribed by your doctor and DO NOT stop taking Eliquis  without talking to the doctor who prescribed the medication.  Stopping may increase your risk of developing a stroke.  Refill your prescription before you run out.  After discharge, you should have regular check-up appointments with your healthcare provider that is prescribing your Eliquis .  In the future your dose may need to be changed if your kidney function or weight changes by a significant amount or as you get older.  What do you do if you miss a dose? If you miss a dose, take it as soon as you remember on the same day and resume taking twice daily.  Do not take more than one dose of ELIQUIS  at the same time to make up a missed dose.  Important Safety Information A possible side effect of Eliquis  is bleeding. You should call your healthcare provider right away if you experience any of the following: Bleeding from an injury or your nose that does not stop. Unusual colored urine (red or dark brown) or unusual colored stools (red or black). Unusual bruising for unknown reasons. A serious fall or if you hit your head (even if there is no bleeding).  Some medicines may  interact with Eliquis  and might increase your risk of bleeding or clotting while on Eliquis . To help avoid this, consult your healthcare provider or pharmacist prior to using any new prescription or non-prescription medications, including herbals, vitamins, non-steroidal anti-inflammatory drugs (NSAIDs) and supplements.  This website has more information on Eliquis  (apixaban ): [email or web address] .[email or web address] dena

## 2024-06-08 NOTE — Plan of Care (Signed)

## 2024-06-08 NOTE — Progress Notes (Signed)
 "  HD#1 SUBJECTIVE:  Patient Summary: Terry Parks is a 69 y.o. with a pertinent PMH of HFrEF (EF 35%) and AF on eliquis , who presented with significant volume overload and admitted for acute on chronic HFrEF exacerbation requiring IV diuresis.   Overnight Events: admitted overnight   Interim History: Sitting in chair. Stated he overall feels well. Denies CP, SOB, palpitations. Endorses peeing well like how he does at home. Endorses decreased leg swelling.   OBJECTIVE:  Vital Signs:  Vitals:   06/08/24 0413 06/08/24 0455 06/08/24 0500 06/08/24 0850  BP: 118/82 124/82  118/82  Pulse: 80 84  85  Resp: (!) 32 19  19  Temp: 97.7 F (36.5 C) 98.3 F (36.8 C)  98.3 F (36.8 C)  TempSrc: Oral Oral  Oral  SpO2: 94% 97%  98%  Weight:   (!) 141 kg   Height:       Supplemental O2: Room Air SpO2: 98 %  Filed Weights   06/07/24 0201 06/07/24 0756 06/08/24 0500  Weight: (!) 141.6 kg (!) 140.8 kg (!) 141 kg     Intake/Output Summary (Last 24 hours) at 06/08/2024 1055 Last data filed at 06/08/2024 0416 Gross per 24 hour  Intake 240 ml  Output 6000 ml  Net -5760 ml   Net IO Since Admission: -9,476.43 mL [06/08/24 1055]  Physical Exam: Physical Exam Constitutional:      General: He is not in acute distress.    Appearance: He is obese. He is not ill-appearing, toxic-appearing or diaphoretic.  HENT:     Mouth/Throat:     Mouth: Mucous membranes are moist.  Cardiovascular:     Rate and Rhythm: Normal rate and regular rhythm.     Heart sounds: No murmur heard.    No friction rub. No gallop.  Pulmonary:     Effort: Pulmonary effort is normal.     Breath sounds: Examination of the right-lower field reveals rales. Examination of the left-lower field reveals rales. Rales present.  Neurological:     Mental Status: He is alert.  Psychiatric:        Mood and Affect: Mood normal.        Behavior: Behavior normal.     Patient Lines/Drains/Airways Status     Active  Line/Drains/Airways     Name Placement date Placement time Site Days   Peripheral IV 06/06/24 20 G Right Antecubital 06/06/24  1929  Antecubital  2   Closed System Drain Right Abdomen 12 Fr. 01/14/18  1257  Abdomen  2337            Pertinent labs and imaging:      Latest Ref Rng & Units 06/07/2024    1:32 AM 06/06/2024    7:41 PM 06/06/2024    5:52 PM  CBC  WBC 4.0 - 10.5 K/uL 5.8   5.9   Hemoglobin 13.0 - 17.0 g/dL 88.2  86.0  87.4   Hematocrit 39.0 - 52.0 % 37.1  41.0  40.1   Platelets 150 - 400 K/uL 130   141        Latest Ref Rng & Units 06/08/2024    4:09 AM 06/07/2024   11:43 AM 06/07/2024    1:32 AM  CMP  Glucose 70 - 99 mg/dL 852  810  798   BUN 8 - 23 mg/dL 12  11  12    Creatinine 0.61 - 1.24 mg/dL 8.67  8.63  8.60   Sodium 135 - 145 mmol/L 138  139  142   Potassium 3.5 - 5.1 mmol/L 3.7  3.5  3.2   Chloride 98 - 111 mmol/L 103  104  104   CO2 22 - 32 mmol/L 27  24  26    Calcium  8.9 - 10.3 mg/dL 8.0  7.9  8.1     ECHOCARDIOGRAM COMPLETE Result Date: 06/07/2024    ECHOCARDIOGRAM REPORT   Patient Name:   Terry Parks Date of Exam: 06/07/2024 Medical Rec #:  [id number]         Height:       73.0 in Accession #:    [id number]        Weight:       312.1 lb Date of Birth:  [birth year]          BSA:          2.600 m Patient Age:    68 years          BP:           135/95 mmHg Patient Gender: M                 HR:           91 bpm. Exam Location:  Inpatient Procedure: 2D Echo, Cardiac Doppler, Color Doppler and Intracardiac            Opacification Agent (Both Spectral and Color Flow Doppler were            utilized during procedure). Indications:    CHF- Acute Systolic  History:        Patient has prior history of Echocardiogram examinations, most                 recent 02/23/2019. Arrythmias:Atrial Fibrillation; Risk                 Factors:Diabetes, Dyslipidemia and Hypertension.  Sonographer:    Sherlean Dubin Referring Phys: [id number] PHOEBE CHUN  Sonographer Comments: Image  acquisition challenging due to patient body habitus and Image acquisition challenging due to respiratory motion. IMPRESSIONS  1. Left ventricular ejection fraction, by estimation, is 35 to 40%. The left ventricle has moderately decreased function. The left ventricle demonstrates global hypokinesis. Left ventricular diastolic parameters are consistent with Grade III diastolic dysfunction (restrictive).  2. Right ventricular systolic function is normal. The right ventricular size is mildly enlarged. There is normal pulmonary artery systolic pressure.  3. Left atrial size was mildly dilated.  4. Right atrial size was mildly dilated.  5. The mitral valve is degenerative. Mild mitral valve regurgitation. No evidence of mitral stenosis.  6. The aortic valve is tricuspid. Aortic valve regurgitation is not visualized. Aortic valve sclerosis is present, with no evidence of aortic valve stenosis.  7. The inferior vena cava is dilated in size with <50% respiratory variability, suggesting right atrial pressure of 15 mmHg. Comparison(s): A prior study was performed on 02/23/2019. LVEF 35-40%, RV function mild reduction, mild LAE and RAE. Conclusion(s)/Recommendation(s): No left ventricular mural or apical thrombus/thrombi. FINDINGS  Left Ventricle: Left ventricular ejection fraction, by estimation, is 35 to 40%. The left ventricle has moderately decreased function. The left ventricle demonstrates global hypokinesis. Definity  contrast agent was given IV to delineate the left ventricular endocardial borders. The left ventricular internal cavity size was normal in size. There is borderline left ventricular hypertrophy. Left ventricular diastolic parameters are consistent with Grade III diastolic dysfunction (restrictive). Right Ventricle: The right ventricular size is mildly enlarged. Right vetricular wall thickness was not well visualized. Right  ventricular systolic function is normal. There is normal pulmonary artery systolic  pressure. The tricuspid regurgitant velocity  is 1.15 m/s, and with an assumed right atrial pressure of 15 mmHg, the estimated right ventricular systolic pressure is 20.3 mmHg. Left Atrium: Left atrial size was mildly dilated. Right Atrium: Right atrial size was mildly dilated. Pericardium: There is no evidence of pericardial effusion. Mitral Valve: The mitral valve is degenerative in appearance. Mild mitral annular calcification. Mild mitral valve regurgitation. No evidence of mitral valve stenosis. Tricuspid Valve: The tricuspid valve is grossly normal. Tricuspid valve regurgitation is trivial. No evidence of tricuspid stenosis. Aortic Valve: The aortic valve is tricuspid. Aortic valve regurgitation is not visualized. Aortic valve sclerosis is present, with no evidence of aortic valve stenosis. Aortic valve mean gradient measures 2.0 mmHg. Aortic valve peak gradient measures 3.5  mmHg. Aortic valve area, by VTI measures 3.05 cm. Pulmonic Valve: The pulmonic valve was normal in structure. Pulmonic valve regurgitation is mild. No evidence of pulmonic stenosis. Aorta: The aortic root and ascending aorta are structurally normal, with no evidence of dilitation. Venous: The inferior vena cava is dilated in size with less than 50% respiratory variability, suggesting right atrial pressure of 15 mmHg. IAS/Shunts: The interatrial septum was not well visualized.  LEFT VENTRICLE PLAX 2D LVIDd:         4.90 cm   Diastology LVIDs:         4.10 cm   LV e' medial:    6.90 cm/s LV PW:         1.20 cm   LV E/e' medial:  13.5 LV IVS:        1.40 cm   LV e' lateral:   8.18 cm/s LVOT diam:     2.20 cm   LV E/e' lateral: 11.4 LV SV:         49 LV SV Index:   19 LVOT Area:     3.80 cm  RIGHT VENTRICLE             IVC RV Basal diam:  4.70 cm     IVC diam: 2.50 cm RV Mid diam:    4.30 cm RV S prime:     12.90 cm/s TAPSE (M-mode): 1.7 cm LEFT ATRIUM             Index        RIGHT ATRIUM           Index LA diam:        5.00 cm 1.92 cm/m    RA Area:     26.90 cm LA Vol (A2C):   98.2 ml 37.78 ml/m  RA Volume:   88.50 ml  34.04 ml/m LA Vol (A4C):   85.3 ml 32.81 ml/m LA Biplane Vol: 91.9 ml 35.35 ml/m  AORTIC VALVE AV Area (Vmax):    3.09 cm AV Area (Vmean):   2.93 cm AV Area (VTI):     3.05 cm AV Vmax:           93.25 cm/s AV Vmean:          63.600 cm/s AV VTI:            0.161 m AV Peak Grad:      3.5 mmHg AV Mean Grad:      2.0 mmHg LVOT Vmax:         75.90 cm/s LVOT Vmean:        49.100 cm/s LVOT VTI:  0.129 m LVOT/AV VTI ratio: 0.80  AORTA Ao Root diam: 3.30 cm Ao Asc diam:  3.40 cm MITRAL VALVE               TRICUSPID VALVE MV Area (PHT): 5.97 cm    TR Peak grad:   5.3 mmHg MV Decel Time: 127 msec    TR Vmax:        115.00 cm/s MR Peak grad: 10.8 mmHg MR Vmax:      164.00 cm/s  SHUNTS MV E velocity: 93.00 cm/s  Systemic VTI:  0.13 m MV A velocity: 38.30 cm/s  Systemic Diam: 2.20 cm MV E/A ratio:  2.43 Sunit Tolia Electronically signed by [employer] Signature Date/Time: 06/07/2024/4:59:23 PM    Final     ASSESSMENT/PLAN:  Assessment: Principal Problem:   Acute on chronic HFrEF (heart failure with reduced ejection fraction) (HCC) Active Problems:   Uncontrolled type 2 diabetes mellitus with hyperglycemia, with long-term current use of insulin  (HCC)   Longstanding persistent atrial fibrillation (HCC)   Chronic anticoagulation   Plan: #Acute on chronic HFrEF (EF 35%) Presented with DOE, orthopnea, edema, and bibasilar crackles with proBNP 4,347 and having missed diuretics over past couple of days. Documented 6.5 L out.  Will continue to diurese given continued hypervolemia. Kidney function stable.  - Metolazone 5 mg given and followed by Lasix  120 mg  - strict I&Os - daily weights (standing if available) - trend BMP, replace electrolytes PRN (goal K>4, Mg >2) - restarted carvedilol , spironolactone , and Entresto     Hx Afib on Eliquis   Denies palpations or CP.  - telemetry  - continue eliquis  5 mg BID -  monitor for RVR   #CKD 3 Cr 1.32. On admission 1.36-1.5 (baseline 1.3).  - trend BMP - Avoid nephrotoxins, renally dose meds  #Uncontrolled T2DM on insulin  therapy #Peripheral neuropathy  Last A1c 11.2. Home Lantus  50 units daily  - Lantus  20 Units  - Moderate SSI with meals, CBG AC/HS - Carb-controlled + 2g Na diet - Duloxetine  60 mg daily   #Thrombocytopenia / mild anemia  Plt 141, Hgb 12.5 (near baseline). Iron  deficiency anemia with %sat 12 and iron  31.  - stable  - consider IV iron  while inpatient, continue IV iron  outpatient.   #Hyperlipidemia  - atorvastatin  40 mg daily - ezetimibe  10 mg daily    #GERD  - Protonix  40 mg daily   Best Practice: Diet: carb controlled and 2 gram sodium restriction  VTE: apixaban  5 mg BID Code: Full  Disposition planning: Therapy Recs: None, DME: none DISPO: Anticipated discharge in 0-1 days to Home pending clinical improvement.  Signature:  Sallyanne Benuel [facility]  10:55 AM, 06/08/2024  On Call pager [phone]  "

## 2024-06-08 NOTE — Telephone Encounter (Signed)
 Pharmacy Patient Advocate Encounter  Insurance verification completed.    The patient is insured through [employer]. Patient has Medicare and is not eligible for a copay card, but may be able to apply for patient assistance or Medicare RX Payment Plan (Patient Must reach out to their plan, if eligible for payment plan), if available.    Ran test claim for Mounjaro  2.5mg /.51ml and the current 30 day co-pay is $4.90.  Ran test claim for Ozempic  2mg /81ml and the current 30 day co-pay is $4.90.  This test claim was processed through [facility]- copay amounts may vary at other pharmacies due to pharmacy/plan contracts, or as the patient moves through the different stages of their insurance plan.

## 2024-06-08 NOTE — Evaluation (Signed)
 Occupational Therapy Evaluation Patient Details Name: Terry Parks MRN: [id number] DOB: 08-06-1955 Today's Date: 06/08/2024   History of Present Illness   Pt is a 69 yo male who presents to [facility] on 1/21 for progressive shortness of breath and swelling in the bilateral lower extremities. He stated worsening dyspnea on exertion and orthopnea began about 2 weeks ago. He reported having the flu around 12/31, but has had persistent fluid overload since. Pt admitted with acute on chronic HFrEF. PMH includes: CHF, HTN, Type 2 Diabetes, CKD Stage 3, OSA, Atrial fibrillation, Diabetic retinopathy, hyperlipdemia,  history of prostate cancer s/p prostatectomy (2019)     Clinical Impressions Pt is from home alone where he was able to complete ADL poorly/struggling. He especially struggled with IADL (Laundry in particular). Today Pt eager to perform bathing at sink. Required seated position for activity tolerance and safe access to UB and LB. He still required mod A for UB and LB bathing. Education provided for energy conservation. He is CGA/supervision for transfers and in room mobility with VSS on RA. AT this time recommending HHOT post-acute to not only focus on safety and independence in ADL and functional transfers but also for IADL.      If plan is discharge home, recommend the following:   A little help with walking and/or transfers;A lot of help with bathing/dressing/bathroom;Assistance with cooking/housework;Help with stairs or ramp for entrance     Functional Status Assessment   Patient has had a recent decline in their functional status and demonstrates the ability to make significant improvements in function in a reasonable and predictable amount of time.     Equipment Recommendations   Tub/shower seat     Recommendations for Other Services   PT consult     Precautions/Restrictions   Precautions Precautions: Fall Recall of Precautions/Restrictions:  Intact Restrictions Weight Bearing Restrictions Per Provider Order: No     Mobility Bed Mobility Overal bed mobility: Needs Assistance Bed Mobility: Sit to Supine, Supine to Sit     Supine to sit: Supervision, HOB elevated, Used rails     General bed mobility comments: no assist to come EOB, use of rail and HOB at 30 degrees    Transfers Overall transfer level: Needs assistance Equipment used: None Transfers: Bed to chair/wheelchair/BSC, Sit to/from Stand Sit to Stand: Supervision Stand pivot transfers: Supervision, Contact guard assist         General transfer comment: Pt able to complete transfers from bed <>chair<>recliner without physical assist      Balance Overall balance assessment: History of Falls, Needs assistance Sitting-balance support: Feet supported, No upper extremity supported Sitting balance-Leahy Scale: Good     Standing balance support: No upper extremity supported, During functional activity Standing balance-Leahy Scale: Fair Standing balance comment: Pt stands with a wide BOS and was able to adjust gown without LOB. Minor LOB with transfers but pt able to recover on his own.                           ADL either performed or assessed with clinical judgement   ADL Overall ADL's : Needs assistance/impaired Eating/Feeding: Independent   Grooming: Set up;Oral care;Brushing hair;Sitting (shaving) Grooming Details (indicate cue type and reason): educated on chair for energy conservation and fall prevention Upper Body Bathing: Moderate assistance Upper Body Bathing Details (indicate cue type and reason): for back Lower Body Bathing: Moderate assistance Lower Body Bathing Details (indicate cue type and reason): below knees Upper  Body Dressing : Set up;Sitting   Lower Body Dressing: Maximal assistance;Sit to/from stand Lower Body Dressing Details (indicate cue type and reason): Pt able to complete sit<>stand, assist to don socks Toilet  Transfer: Contact guard assist;Ambulation;Supervision/safety Toilet Transfer Details (indicate cue type and reason): reaches out in environment for support/balance PRN Toileting- Clothing Manipulation and Hygiene: Contact guard assist;Sit to/from stand Toileting - Clothing Manipulation Details (indicate cue type and reason): typically wears depends due to prostate issues/hx of CA     Functional mobility during ADLs: Contact guard assist;Supervision/safety (no DME) General ADL Comments: also needs assist with IADL in home - particularly laundry, this is a task that he has been struggling with     Vision Baseline Vision/History: 1 Wears glasses Ability to See in Adequate Light: 0 Adequate Patient Visual Report: No change from baseline Vision Assessment?: No apparent visual deficits;Wears glasses for reading     Perception         Praxis         Pertinent Vitals/Pain Pain Assessment Pain Assessment: Faces Faces Pain Scale: Hurts little more Pain Location: Bilateral Lower extremity Pain Descriptors / Indicators: Discomfort, Tiring Pain Intervention(s): Monitored during session, Repositioned     Extremity/Trunk Assessment Upper Extremity Assessment Upper Extremity Assessment: Overall WFL for tasks assessed;Generalized weakness   Lower Extremity Assessment Lower Extremity Assessment: Defer to PT evaluation   Cervical / Trunk Assessment Cervical / Trunk Assessment: Normal   Communication Communication Communication: No apparent difficulties   Cognition Arousal: Alert Behavior During Therapy: Agitated Cognition: No apparent impairments             OT - Cognition Comments: Pt seems like he might have low health literacy and overall decreased safety awareness.                 Following commands: Intact       Cueing  General Comments   Cueing Techniques: Verbal cues  VSS on RA   Exercises     Shoulder Instructions      Home Living Family/patient  expects to be discharged to:: Private residence Living Arrangements: Alone Available Help at Discharge: Neighbor;Available PRN/intermittently;Family;Friend(s) (Has neighbors that check on him frequently and are aware of where he is at all times. Son lives in [place] but is unable to be there 24/7) Type of Home: Apartment Home Access: Stairs to enter [employer] of Steps: 23 Entrance Stairs-Rails: Can reach both;Right;Left Home Layout: Two level;Bed/bath upstairs Alternate Level Stairs-Number of Steps: 21 Alternate Level Stairs-Rails: Right;Left;Can reach both Bathroom Shower/Tub: Producer, Television/film/video: Standard Bathroom Accessibility: Yes   Home Equipment: None (had prior DME euqipment such as RW but did not use it that much and gave it away)   Additional Comments: Pt states he typically uses R rail in his apartment to go up the stairs.      Prior Functioning/Environment Prior Level of Function : Driving;History of Falls (last six months);Independent/Modified Independent             Mobility Comments: Pt is independent with mobility and does not use any AD and one fall in the past 6 months. ADLs Comments: Pt had a former PCA that came in 2hrs a day to assist with laundry and cooking but had gotten rid of the PCA due to ongoing issues with the agency. Currently, he has been managing on his own but has been struggling with laundry particularly.    OT Problem List: Decreased activity tolerance;Cardiopulmonary status limiting activity;Obesity  OT Treatment/Interventions: Self-care/ADL training;Energy conservation;DME and/or AE instruction;Therapeutic activities;Patient/family education;Balance training      OT Goals(Current goals can be found in the care plan section)   Acute Rehab OT Goals Patient Stated Goal: get home and get more independent, get legs feeling better OT Goal Formulation: With patient Time For Goal Achievement: 06/22/24 Potential to  Achieve Goals: Good   OT Frequency:  Min 2X/week    Co-evaluation              AM-PAC OT 6 Clicks Daily Activity     Outcome Measure Help from another person eating meals?: None Help from another person taking care of personal grooming?: A Little Help from another person toileting, which includes using toliet, bedpan, or urinal?: A Little Help from another person bathing (including washing, rinsing, drying)?: A Lot Help from another person to put on and taking off regular upper body clothing?: None Help from another person to put on and taking off regular lower body clothing?: A Lot 6 Click Score: 18   End of Session Equipment Utilized During Treatment: Gait belt Nurse Communication: Mobility status;Precautions  Activity Tolerance: Patient tolerated treatment well Patient left: in chair;with call bell/phone within reach;with nursing/sitter in room  OT Visit Diagnosis: Unsteadiness on feet (R26.81);Other abnormalities of gait and mobility (R26.89)                Time: 8884-8852 OT Time Calculation (min): 32 min Charges:  OT General Charges $OT Visit: 1 Visit OT Evaluation $OT Eval Moderate Complexity: 1 Mod OT Treatments $Self Care/Home Management : 8-22 mins  Leita DEL OTR/L Acute Rehabilitation Services Office: [phone]  Leita PARAS [facility] 06/08/2024, 11:59 AM

## 2024-06-08 NOTE — Progress Notes (Addendum)
 "  Heart Failure Stewardship Pharmacist Progress Note   PCP: Nooruddin, Saad, MD PCP-Cardiologist: Jerel Balding, MD    HPI:  69 year old male with PMH significant for HFrEF (last EF in 2020 35-40%), persistent atrial fibrillation (on Eliquis ), HTN, T2DM, CKD3, HLD, obesity, OSA, GERD, history of prostate cancer s/p prostatectomy (2019) who was admitted on 06/06/24 with a chief complaint of progressive shortness of breath, orthopnea, and leg swelling. He stated that his symptoms of worsening dyspnea on exertion and orthopnea began about 2 weeks ago. He reported having the flu around 12/31, but has had persistent fluid overload since. He endorsed continued urination on his PTA diuretic regimen and that he did had some response to increased diuretics but it has been worsening recently. He uses pill packs, but reported missing at least 1-2 days of therapy prior to admission. CXR showed mild cardiomegaly. He was in atrial fibrillation initially, but is now in NSR.   Patient reports he feels well. Breathing has greatly improved since admission. Denies shortness of breath at rest or on exertion. Still with overt LE edema. He stated he only missed ~1-2 doses weekly if any. He does have some of his pill packs from [facility] in his room, but his son would be able to bring the rest if he has any further medications changes for the pharmacist to help set up a pill box until [name] can make him a new pill pack.   Current HF Medications: Diuretic: furosemide  120 mg IV x 1 and metolazone  5 mg x 1 Beta Blocker: carvedilol  6.25 mg BID ACE/ARB/ARNI: Entresto  49/51 mg BID MRA: spironolactone  25 mg daily  Prior to admission HF Medications: Diuretic: furosemide  80 mg BID Beta blocker: carvedilol  6.25 mg BID ACE/ARB/ARNI: Entresto  49/51 mg BID MRA: spironolactone  25 mg daily  Pertinent Lab Values: Serum creatinine 1.32 (BL ~1.24), BUN 12, Potassium 3.7, Sodium 138, proBNP 4,347, Magnesium  1.5 (repleted  with 4 grams), A1c 11.2% Ferritin 166, TSAT 12  Vital Signs: Weight: 310 lbs (admission weight: 312 lbs) Blood pressure: 120/80s  Heart rate: 80-90s I/O: net -6.8 L yesterday; net -9.5 L since admission  Medication Assistance / Insurance Benefits Check: Does the patient have prescription insurance?  Yes Type of insurance plan: [employer] Medicare + Medicaid  Outpatient Pharmacy:  Prior to admission outpatient pharmacy: [name] Is the patient willing to use [facility] TOC pharmacy at discharge? Yes Is the patient willing to transition their outpatient pharmacy to utilize a [facility] outpatient pharmacy?   Yes    Assessment: 1. Acute on chronic systolic CHF (LVEF 35-40%), due to non-ischemic etiology. NYHA class III symptoms. - Agree with furosemide  120 mg IV x1 and metolazone  5 mg x1 today. Strict I/Os and daily weights. Still with significant LE edema. Consider adding UNNA boots. Keep K>4 and Mg>2. KCl 40 mEq BID ordered for replacement. Consider additional dose to achieve >4. Recheck mag level in AM.  - Continue carvedilol  6.25 mg BID - Continue Entresto  49/51 mg BID - Continue spironolactone  25 mg daily - No SGLT2i - Jardiance  stopped in 02/2024 due to severe groin infection. Last A1c 11.2%. - TSAT 12 and ferritin 166 - consider adding IV iron     Plan: 1) Medication changes recommended at this time: - Add UNNA boots - Add IV iron  - Add additional KCl 40 mEq x 1  2)  Education  - Patient has been educated on current HF medications and potential additions to HF medication regimen - Patient verbalizes understanding  that over the next few months, these medication doses may change and more medications may be added to optimize HF regimen - Patient has been educated on basic disease state pathophysiology and goals of therapy  Duwaine Plant, PharmD, BCPS Heart Failure Stewardship Pharmacist Phone [phone]    "

## 2024-06-09 DIAGNOSIS — I5023 Acute on chronic systolic (congestive) heart failure: Principal | ICD-10-CM

## 2024-06-09 DIAGNOSIS — I4811 Longstanding persistent atrial fibrillation: Secondary | ICD-10-CM | POA: Diagnosis not present

## 2024-06-09 DIAGNOSIS — Z794 Long term (current) use of insulin: Secondary | ICD-10-CM

## 2024-06-09 DIAGNOSIS — D649 Anemia, unspecified: Secondary | ICD-10-CM

## 2024-06-09 DIAGNOSIS — Z7901 Long term (current) use of anticoagulants: Secondary | ICD-10-CM

## 2024-06-09 DIAGNOSIS — E1140 Type 2 diabetes mellitus with diabetic neuropathy, unspecified: Secondary | ICD-10-CM | POA: Diagnosis not present

## 2024-06-09 DIAGNOSIS — D696 Thrombocytopenia, unspecified: Secondary | ICD-10-CM | POA: Diagnosis not present

## 2024-06-09 DIAGNOSIS — N183 Chronic kidney disease, stage 3 unspecified: Secondary | ICD-10-CM | POA: Diagnosis not present

## 2024-06-09 DIAGNOSIS — E1165 Type 2 diabetes mellitus with hyperglycemia: Secondary | ICD-10-CM | POA: Diagnosis not present

## 2024-06-09 DIAGNOSIS — E1122 Type 2 diabetes mellitus with diabetic chronic kidney disease: Secondary | ICD-10-CM | POA: Diagnosis not present

## 2024-06-09 DIAGNOSIS — K219 Gastro-esophageal reflux disease without esophagitis: Secondary | ICD-10-CM

## 2024-06-09 DIAGNOSIS — E785 Hyperlipidemia, unspecified: Secondary | ICD-10-CM

## 2024-06-09 LAB — BASIC METABOLIC PANEL WITH GFR
Anion gap: 12 (ref 5–15)
BUN: 14 mg/dL (ref 8–23)
CO2: 23 mmol/L (ref 22–32)
Calcium: 8.1 mg/dL — ABNORMAL LOW (ref 8.9–10.3)
Chloride: 100 mmol/L (ref 98–111)
Creatinine, Ser: 1.45 mg/dL — ABNORMAL HIGH (ref 0.61–1.24)
GFR, Estimated: 53 mL/min — ABNORMAL LOW
Glucose, Bld: 213 mg/dL — ABNORMAL HIGH (ref 70–99)
Potassium: 4.1 mmol/L (ref 3.5–5.1)
Sodium: 136 mmol/L (ref 135–145)

## 2024-06-09 LAB — GLUCOSE, CAPILLARY
Glucose-Capillary: 151 mg/dL — ABNORMAL HIGH (ref 70–99)
Glucose-Capillary: 188 mg/dL — ABNORMAL HIGH (ref 70–99)
Glucose-Capillary: 128 mg/dL — ABNORMAL HIGH (ref 70–99)
Glucose-Capillary: 140 mg/dL — ABNORMAL HIGH (ref 70–99)
Glucose-Capillary: 201 mg/dL — ABNORMAL HIGH (ref 70–99)

## 2024-06-09 LAB — MAGNESIUM: Magnesium: 1.8 mg/dL (ref 1.7–2.4)

## 2024-06-09 MED ORDER — MAGNESIUM SULFATE 2 GM/50ML IV SOLN
2.0000 g | Freq: Once | INTRAVENOUS | Status: AC
  Administered 2024-06-09: 2 g via INTRAVENOUS
  Filled 2024-06-09: qty 50

## 2024-06-09 MED ORDER — MAGNESIUM SULFATE 2 GM/50ML IV SOLN: 2.0000 g | Freq: Once | INTRAVENOUS | Status: DC

## 2024-06-09 MED ORDER — FUROSEMIDE 10 MG/ML IJ SOLN
120.0000 mg | Freq: Once | INTRAVENOUS | Status: AC
  Administered 2024-06-09: 120 mg via INTRAVENOUS
  Filled 2024-06-09: qty 10

## 2024-06-09 NOTE — Progress Notes (Signed)
 "  HD#2 SUBJECTIVE:  Patient Summary: Terry Parks is a 69 y.o. with a pertinent PMH of HFrEF (EF 35%) and AF on eliquis , who presented with significant volume overload and admitted for acute on chronic HFrEF exacerbation requiring IV diuresis.   Overnight Events and Interim History: NEO. Resting deeply in bed at start of examination, head of bed very slightly elevated. He has no acute concerns. He denies dyspnea or pain, including chest and legs. He is not sure if his swelling is improved since yesterday (but thinks it is better than PTH) and notes the increased dose of lasix  yesterday. He has not tried to walk recently.  OBJECTIVE:  Vital Signs: Vitals:   06/08/24 1951 06/09/24 0318 06/09/24 0622 06/09/24 0913  BP: 112/73 131/87  (!) 130/91  Pulse: 86 79  88  Resp: 16 16 (!) 24 20  Temp: 97.9 F (36.6 C) 98.3 F (36.8 C)  97.6 F (36.4 C)  TempSrc: Oral Oral  Oral  SpO2: 100% 100%  98%  Weight:   (!) 140.8 kg 135.9 kg  Height:       Supplemental O2: Room Air SpO2: 98 %  Filed Weights   06/08/24 0500 06/09/24 0622 06/09/24 0913  Weight: (!) 141 kg (!) 140.8 kg 135.9 kg    Intake/Output Summary (Last 24 hours) at 06/09/2024 0953 Last data filed at 06/09/2024 0600 Gross per 24 hour  Intake --  Output 4900 ml  Net -4900 ml   Net IO Since Admission: -14,376.43 mL [06/09/24 0953]  Physical Exam: Physical Exam Constitutional:      General: He is not in acute distress.    Appearance: He is well-developed. He is obese. He is not ill-appearing.  Cardiovascular:     Rate and Rhythm: Normal rate and regular rhythm.  Pulmonary:     Effort: Pulmonary effort is normal.     Breath sounds: Normal breath sounds. No rales.  Abdominal:     Palpations: Abdomen is soft.     Tenderness: There is no abdominal tenderness.  Musculoskeletal:     Right lower leg: Edema present.     Left lower leg: Edema present.     Comments: Lower extremities are wrapped. Edema maintains above the  thighs.  Skin:    General: Skin is warm and dry.  Neurological:     General: No focal deficit present.     Mental Status: He is alert and oriented to person, place, and time.  Psychiatric:        Mood and Affect: Mood normal.        Behavior: Behavior normal.    Patient Lines/Drains/Airways Status     Active Line/Drains/Airways     Name Placement date Placement time Site Days   Closed System Drain Right Abdomen 12 Fr. 01/14/18  1257  Abdomen  2338           ASSESSMENT/PLAN:  Assessment: Principal Problem:   Acute on chronic HFrEF (heart failure with reduced ejection fraction) (HCC) Active Problems:   Uncontrolled type 2 diabetes mellitus with hyperglycemia, with long-term current use of insulin  (HCC)   Longstanding persistent atrial fibrillation (HCC)   Chronic anticoagulation  Terry Parks is a 69 y.o. with a pertinent PMH of HFrEF (EF 35%) and AF on eliquis , who presented with significant volume overload and admitted for acute on chronic HFrEF exacerbation requiring IV diuresis.   Plan: Acute on chronic HFrEF (EF 35%) Presented with DOE, orthopnea, edema, and bibasilar crackles with proBNP 4,347 and having  missed diuretics over past couple of days. He is diuresing well and clinically stable, but appears to have more fluid to lose. Only mild increase in Cr this am. Morning weight is 135kg and I believe dry weight is around 125kg. - Lasix  120 mg IV daily, consider metolazone  as well - strict I&Os, daily weights (standing most accurate) - Replace electrolytes PRN (goal K>4, Mg >2) - Resumed home carvedilol , spironolactone , and Entresto      Thrombocytopenia / mild anemia  Anemia most consistent with chronic disease, possible dilution as well. Plt 130, Hgb 11.7 (near baseline).   Chronic Stable Issues  Afib on Eliquis   Denies palpations or CP.  - telemetry  - Eliquis  5 mg BID   CKD 3 Baseline around 1.3  - Avoid nephrotoxins, renally dose meds   Uncontrolled  T2DM on insulin  therapy Peripheral neuropathy  Last A1c 11.2. Home Lantus  50 units daily  - Lantus  20 Units and moderate SSI with meals, CBG AC/HS - Carb-controlled + 2g Na diet - Duloxetine  60 mg daily     Hyperlipidemia  - atorvastatin  40 mg daily - ezetimibe  10 mg daily     GERD  - Protonix  40 mg daily    Best Practice: Diet: carb controlled and 2 gram sodium restriction  VTE: apixaban  5 mg BID Code: Full   Disposition planning: Therapy Recs: None, DME: none DISPO: Anticipated discharge in 1-3 days to Home pending further fluid removal.  Signature: Lonni Africa, D.O.  Internal Medicine Resident, PGY-2 [facility]  Pager: # [phone]. 9:53 AM, 06/09/2024   "

## 2024-06-09 NOTE — TOC CM/SW Note (Signed)
 Follow-up with pt regarding HH preference. Pt is still undecided. He reports that the CM informed him that the [facility] agency won't be to f/u until next weed due to the weather. He reports that he is not going home over the weekend. He reports he may go home on Monday or Tuesday. He reports that he will make a decision next week. Will continue to f/u to assist with the DC plan.

## 2024-06-10 DIAGNOSIS — E1165 Type 2 diabetes mellitus with hyperglycemia: Secondary | ICD-10-CM | POA: Diagnosis not present

## 2024-06-10 DIAGNOSIS — I5023 Acute on chronic systolic (congestive) heart failure: Secondary | ICD-10-CM | POA: Diagnosis not present

## 2024-06-10 DIAGNOSIS — E1122 Type 2 diabetes mellitus with diabetic chronic kidney disease: Secondary | ICD-10-CM | POA: Diagnosis not present

## 2024-06-10 DIAGNOSIS — E1140 Type 2 diabetes mellitus with diabetic neuropathy, unspecified: Secondary | ICD-10-CM | POA: Diagnosis not present

## 2024-06-10 LAB — GLUCOSE, CAPILLARY
Glucose-Capillary: 210 mg/dL — ABNORMAL HIGH (ref 70–99)
Glucose-Capillary: 125 mg/dL — ABNORMAL HIGH (ref 70–99)
Glucose-Capillary: 165 mg/dL — ABNORMAL HIGH (ref 70–99)
Glucose-Capillary: 152 mg/dL — ABNORMAL HIGH (ref 70–99)

## 2024-06-10 LAB — BASIC METABOLIC PANEL WITH GFR
Anion gap: 10 (ref 5–15)
BUN: 17 mg/dL (ref 8–23)
CO2: 27 mmol/L (ref 22–32)
Calcium: 8.3 mg/dL — ABNORMAL LOW (ref 8.9–10.3)
Chloride: 99 mmol/L (ref 98–111)
Creatinine, Ser: 1.4 mg/dL — ABNORMAL HIGH (ref 0.61–1.24)
GFR, Estimated: 55 mL/min — ABNORMAL LOW
Glucose, Bld: 183 mg/dL — ABNORMAL HIGH (ref 70–99)
Potassium: 4 mmol/L (ref 3.5–5.1)
Sodium: 136 mmol/L (ref 135–145)

## 2024-06-10 LAB — MAGNESIUM: Magnesium: 1.7 mg/dL (ref 1.7–2.4)

## 2024-06-10 MED ORDER — FUROSEMIDE 10 MG/ML IJ SOLN
120.0000 mg | Freq: Once | INTRAVENOUS | Status: AC
  Administered 2024-06-10: 120 mg via INTRAVENOUS
  Filled 2024-06-10: qty 10

## 2024-06-10 MED ORDER — MAGNESIUM SULFATE 2 GM/50ML IV SOLN
2.0000 g | Freq: Once | INTRAVENOUS | Status: AC
  Administered 2024-06-10: 2 g via INTRAVENOUS
  Filled 2024-06-10: qty 50

## 2024-06-10 MED ORDER — SENNOSIDES-DOCUSATE SODIUM 8.6-50 MG PO TABS
1.0000 | ORAL_TABLET | Freq: Every day | ORAL | Status: DC
  Administered 2024-06-10 – 2024-06-11 (×2): 1 via ORAL
  Filled 2024-06-10 (×2): qty 1

## 2024-06-10 NOTE — Plan of Care (Signed)

## 2024-06-10 NOTE — Progress Notes (Signed)
 "  HD#3 SUBJECTIVE:  Patient Summary: Terry Parks is a 69 y.o. with a pertinent PMH of HFrEF (EF 35%) and AF on eliquis , who presented with significant volume overload and admitted for acute on chronic HFrEF exacerbation requiring IV diuresis.   Overnight Events and Interim History: NEO. Sitting upright in bed this morning. He has no concerns other than requesting removal of his unna boot to see what his swelling is like. He is not short of breath and has no pain. He continues to urinate well. He is pleased that his weight is decreasing and is starting to feel more like normal.  OBJECTIVE:  Vital Signs: Vitals:   06/09/24 1947 06/09/24 2323 06/10/24 0308 06/10/24 0843  BP: 137/84 102/79 (!) 126/91 (!) 145/93  Pulse: (!) 104 84 78 84  Resp: 20 20 20  (!) 21  Temp: (!) 97.4 F (36.3 C) 97.8 F (36.6 C) 98.5 F (36.9 C) 97.8 F (36.6 C)  TempSrc: Oral Oral Oral Oral  SpO2: 98%  100% 95%  Weight:   132.2 kg   Height:       Supplemental O2: Room Air SpO2: 95 %  Filed Weights   06/09/24 0622 06/09/24 0913 06/10/24 0308  Weight: (!) 140.8 kg 135.9 kg 132.2 kg     Intake/Output Summary (Last 24 hours) at 06/10/2024 0910 Last data filed at 06/10/2024 9687 Gross per 24 hour  Intake --  Output 5300 ml  Net -5300 ml   Net IO Since Admission: -19,676.43 mL [06/10/24 0910]  Physical Exam: Physical Exam Constitutional:      Appearance: He is well-developed. He is not ill-appearing.  Cardiovascular:     Rate and Rhythm: Normal rate and regular rhythm.     Pulses: Normal pulses.  Pulmonary:     Effort: Pulmonary effort is normal.     Breath sounds: Normal breath sounds. No wheezing, rhonchi or rales.  Abdominal:     General: Bowel sounds are normal.     Palpations: Abdomen is soft.  Musculoskeletal:     Right lower leg: Edema present.     Left lower leg: Edema present.     Comments: Nonpitting edema in bilateral feet. No edema above unna boot. Extremities are warm and  nontender. Some signs of chronic stasis in feet.  Skin:    General: Skin is warm and dry.  Neurological:     Mental Status: He is alert.  Psychiatric:        Mood and Affect: Mood normal.        Behavior: Behavior normal.    Patient Lines/Drains/Airways Status     Active Line/Drains/Airways     Name Placement date Placement time Site Days   Peripheral IV 06/09/24 20 G 1.88 Left;Anterior Forearm 06/09/24  1020  Forearm  1   Closed System Drain Right Abdomen 12 Fr. 01/14/18  1257  Abdomen  2339            ASSESSMENT/PLAN:  Assessment: Principal Problem:   Acute on chronic HFrEF (heart failure with reduced ejection fraction) (HCC) Active Problems:   Uncontrolled type 2 diabetes mellitus with hyperglycemia, with long-term current use of insulin  (HCC)   Longstanding persistent atrial fibrillation (HCC)   Chronic anticoagulation   Acute on chronic systolic congestive heart failure (HCC)  Terry Parks is a 69 y.o. with a pertinent PMH of HFrEF (EF 35%) and AF on eliquis , who presented with significant volume overload and admitted for acute on chronic HFrEF exacerbation requiring IV diuresis.  Plan: Acute on chronic HFrEF (EF 35%) Presented with DOE, orthopnea, edema, and bibasilar crackles with proBNP 4,347 and having missed diuretics over past couple of days. Orthopnea and rales resolved, DOE improving. He is diuresing well and clinically stable and approaching euvolemia. Renal function unaffected by diuresis ( out yesterday). Morning weight is 132kg and I believe dry weight is around 125kg. - Lasix  120 mg IV daily, consider metolazone  as well - strict I&Os, daily weights (standing most accurate) - Replace electrolytes PRN (goal K>4, Mg >2) - Resumed home carvedilol , spironolactone , and Entresto      Thrombocytopenia / mild anemia  Anemia most consistent with chronic disease, possible dilution as well. Plt 130, Hgb 11.7 (near baseline).     Chronic Stable  Issues   Afib on Eliquis   Denies palpations or CP.  - telemetry  - Eliquis  5 mg BID   CKD 3 Baseline around 1.3  - Avoid nephrotoxins, renally dose meds   Uncontrolled T2DM on insulin  therapy Peripheral neuropathy  Last A1c 11.2. Home Lantus  50 units daily. May benefit from podiatry services out of hospital. - Lantus  20 Units and moderate SSI with meals, CBG AC/HS - Carb-controlled + 2g Na diet - Duloxetine  60 mg daily     Hyperlipidemia  - atorvastatin  40 mg daily - ezetimibe  10 mg daily     GERD  - Protonix  40 mg daily    Best Practice: Diet: carb controlled and 2 gram sodium restriction  VTE: apixaban  5 mg BID Code: Full   Disposition planning: Therapy Recs: None, DME: none DISPO: Anticipated discharge in 1-2 days to Home pending further fluid removal.  Signature: Terry Parks, D.O.  Internal Medicine Resident, PGY-2 [facility]  Pager: # [phone]. 9:10 AM, 06/10/2024   "

## 2024-06-11 ENCOUNTER — Other Ambulatory Visit: Payer: Self-pay

## 2024-06-11 DIAGNOSIS — K219 Gastro-esophageal reflux disease without esophagitis: Secondary | ICD-10-CM | POA: Diagnosis not present

## 2024-06-11 DIAGNOSIS — I4891 Unspecified atrial fibrillation: Secondary | ICD-10-CM | POA: Diagnosis not present

## 2024-06-11 DIAGNOSIS — N183 Chronic kidney disease, stage 3 unspecified: Secondary | ICD-10-CM | POA: Diagnosis not present

## 2024-06-11 DIAGNOSIS — E1142 Type 2 diabetes mellitus with diabetic polyneuropathy: Secondary | ICD-10-CM

## 2024-06-11 DIAGNOSIS — I5023 Acute on chronic systolic (congestive) heart failure: Secondary | ICD-10-CM | POA: Diagnosis not present

## 2024-06-11 DIAGNOSIS — E785 Hyperlipidemia, unspecified: Secondary | ICD-10-CM | POA: Diagnosis not present

## 2024-06-11 DIAGNOSIS — N179 Acute kidney failure, unspecified: Secondary | ICD-10-CM

## 2024-06-11 DIAGNOSIS — Z794 Long term (current) use of insulin: Secondary | ICD-10-CM | POA: Diagnosis not present

## 2024-06-11 DIAGNOSIS — Z862 Personal history of diseases of the blood and blood-forming organs and certain disorders involving the immune mechanism: Secondary | ICD-10-CM

## 2024-06-11 DIAGNOSIS — E1122 Type 2 diabetes mellitus with diabetic chronic kidney disease: Secondary | ICD-10-CM | POA: Diagnosis not present

## 2024-06-11 DIAGNOSIS — Z7901 Long term (current) use of anticoagulants: Secondary | ICD-10-CM | POA: Diagnosis not present

## 2024-06-11 LAB — BASIC METABOLIC PANEL WITH GFR
Anion gap: 9 (ref 5–15)
BUN: 19 mg/dL (ref 8–23)
CO2: 28 mmol/L (ref 22–32)
Calcium: 8.6 mg/dL — ABNORMAL LOW (ref 8.9–10.3)
Chloride: 97 mmol/L — ABNORMAL LOW (ref 98–111)
Creatinine, Ser: 1.72 mg/dL — ABNORMAL HIGH (ref 0.61–1.24)
GFR, Estimated: 43 mL/min — ABNORMAL LOW
Glucose, Bld: 169 mg/dL — ABNORMAL HIGH (ref 70–99)
Potassium: 4.9 mmol/L (ref 3.5–5.1)
Sodium: 133 mmol/L — ABNORMAL LOW (ref 135–145)

## 2024-06-11 LAB — CBC
HCT: 40.5 % (ref 39.0–52.0)
Hemoglobin: 13.3 g/dL (ref 13.0–17.0)
MCH: 26.4 pg (ref 26.0–34.0)
MCHC: 32.8 g/dL (ref 30.0–36.0)
MCV: 80.5 fL (ref 80.0–100.0)
Platelets: 196 10*3/uL (ref 150–400)
RBC: 5.03 MIL/uL (ref 4.22–5.81)
RDW: 14.6 % (ref 11.5–15.5)
WBC: 7.4 10*3/uL (ref 4.0–10.5)
nRBC: 0 % (ref 0.0–0.2)

## 2024-06-11 LAB — GLUCOSE, CAPILLARY
Glucose-Capillary: 201 mg/dL — ABNORMAL HIGH (ref 70–99)
Glucose-Capillary: 145 mg/dL — ABNORMAL HIGH (ref 70–99)
Glucose-Capillary: 216 mg/dL — ABNORMAL HIGH (ref 70–99)
Glucose-Capillary: 139 mg/dL — ABNORMAL HIGH (ref 70–99)

## 2024-06-11 LAB — MAGNESIUM: Magnesium: 2 mg/dL (ref 1.7–2.4)

## 2024-06-11 MED ORDER — INSULIN GLARGINE 100 UNIT/ML ~~LOC~~ SOLN
40.0000 [IU] | Freq: Every day | SUBCUTANEOUS | Status: DC
  Administered 2024-06-12: 40 [IU] via SUBCUTANEOUS
  Filled 2024-06-11 (×2): qty 0.4

## 2024-06-11 MED ORDER — INSULIN GLARGINE 100 UNIT/ML ~~LOC~~ SOLN
20.0000 [IU] | Freq: Once | SUBCUTANEOUS | Status: AC
  Administered 2024-06-11: 20 [IU] via SUBCUTANEOUS
  Filled 2024-06-11: qty 0.2

## 2024-06-11 MED ORDER — FUROSEMIDE 10 MG/ML IJ SOLN
40.0000 mg | Freq: Once | INTRAMUSCULAR | Status: AC
  Administered 2024-06-11: 40 mg via INTRAVENOUS
  Filled 2024-06-11: qty 4

## 2024-06-11 MED ORDER — ACETAZOLAMIDE SODIUM 500 MG IJ SOLR
250.0000 mg | Freq: Once | INTRAMUSCULAR | Status: AC
  Administered 2024-06-11: 250 mg via INTRAVENOUS
  Filled 2024-06-11: qty 250

## 2024-06-11 NOTE — Care Management Important Message (Signed)
 Important Message  Patient Details  Name: Terry Parks MRN: [id number] Date of Birth: 10/26/1955   Important Message Given:  Yes - Medicare IM     Vonzell Arrie Sharps 06/11/2024, 10:21 AM

## 2024-06-11 NOTE — Care Management Important Message (Signed)
 Important Message  Patient Details  Name: Terry Parks MRN: [id number] Date of Birth: 02/02/1956   Important Message Given:  Yes - Medicare IM  Printed to unit and staff to provide to patient   Charlann Rayfield Hurst, RN 06/11/2024, 10:12 AM

## 2024-06-11 NOTE — Progress Notes (Signed)
 Physical Therapy Treatment Patient Details Name: Terry Parks MRN: [id number] DOB: 02-17-1956 Today's Date: 06/11/2024   History of Present Illness Pt is a 69 yo male who presents to [facility] on 1/21 for progressive shortness of breath and swelling in the bilateral lower extremities. He stated worsening dyspnea on exertion and orthopnea began about 2 weeks ago. He reported having the flu around 12/31, but has had persistent fluid overload since. Pt admitted with acute on chronic HFrEF. PMH includes: CHF, HTN, Type 2 Diabetes, CKD Stage 3, OSA, Atrial fibrillation, Diabetic retinopathy, hyperlipdemia,  history of prostate cancer s/p prostatectomy (2019)    PT Comments  Pt tolerated treatment well today. Session focused on stair training as pt has 23 steps to enter his apartment. Pt able to complete a flight of stairs with CGA/Supervision. Pt also able to progress ambulation in hallway with no AD CGA however had 1 loss of balance requiring Mod A to correct. No change in DC/DME recs at this time. PT will continue to follow.     If plan is discharge home, recommend the following: Assistance with cooking/housework;Direct supervision/assist for medications management;Assist for transportation;Help with stairs or ramp for entrance   Can travel by private vehicle        Equipment Recommendations  None recommended by PT    Recommendations for Other Services       Precautions / Restrictions Precautions Precautions: Fall Recall of Precautions/Restrictions: Intact Restrictions Weight Bearing Restrictions Per Provider Order: No     Mobility  Bed Mobility               General bed mobility comments: Seated EOB upon arrival.    Transfers Overall transfer level: Needs assistance Equipment used: None Transfers: Sit to/from Stand Sit to Stand: Supervision           General transfer comment: no physical assistance required. Pt able to don slippers on his own.     Ambulation/Gait Ambulation/Gait assistance: Contact guard assist Gait Distance (Feet): 150 Feet Assistive device: None Gait Pattern/deviations: Wide base of support, Knee flexed in stance - right, Knee flexed in stance - left, Decreased stride length, Step-through pattern, Decreased step length - right, Decreased step length - left, Shuffle Gait velocity: Reduced Gait velocity interpretation: 1.31 - 2.62 ft/sec, indicative of limited community ambulator   General Gait Details: Pt able to ambulate in hallway with CGA no AD mostly however pt had 1 LOB requiring Mod A to correct. Pt ambulating with wide BOS and almost waddling gait likely due to body habitus.   Stairs Stairs: Yes Stairs assistance: Contact guard assist, Supervision Stair Management: One rail Left, Alternating pattern, Step to pattern, Forwards, One rail Right Number of Stairs: 10 General stair comments: no LOB noted. Variable step pattern.   Wheelchair Mobility     Tilt Bed    Modified Rankin (Stroke Patients Only)       Balance Overall balance assessment: History of Falls, Needs assistance Sitting-balance support: Feet supported, No upper extremity supported Sitting balance-Leahy Scale: Good     Standing balance support: No upper extremity supported, During functional activity Standing balance-Leahy Scale: Fair Standing balance comment: 1 LOB noted with Mod A to correct.                            Communication Communication Communication: No apparent difficulties  Cognition Arousal: Alert Behavior During Therapy: WFL for tasks assessed/performed   PT - Cognitive impairments: Safety/Judgement  PT - Cognition Comments: Pt appeared to be somewhat tangetial at times with decreased safety awareness. 1 LOB due to patient being distracted. Following commands: Intact      Cueing Cueing Techniques: Verbal cues  Exercises      General Comments General  comments (skin integrity, edema, etc.): VSS on RA      Pertinent Vitals/Pain Pain Assessment Pain Assessment: No/denies pain    Home Living                          Prior Function            PT Goals (current goals can now be found in the care plan section) Progress towards PT goals: Progressing toward goals    Frequency    Min 1X/week      PT Plan      Co-evaluation              AM-PAC PT 6 Clicks Mobility   Outcome Measure  Help needed turning from your back to your side while in a flat bed without using bedrails?: A Little Help needed moving from lying on your back to sitting on the side of a flat bed without using bedrails?: A Little Help needed moving to and from a bed to a chair (including a wheelchair)?: A Little Help needed standing up from a chair using your arms (e.g., wheelchair or bedside chair)?: A Little Help needed to walk in hospital room?: A Little Help needed climbing 3-5 steps with a railing? : A Little 6 Click Score: 18    End of Session Equipment Utilized During Treatment: Gait belt Activity Tolerance: Patient tolerated treatment well Patient left: in chair;with call bell/phone within reach;with chair alarm set Nurse Communication: Mobility status PT Visit Diagnosis: Unsteadiness on feet (R26.81);Other abnormalities of gait and mobility (R26.89);History of falling (Z91.81);Muscle weakness (generalized) (M62.81);Difficulty in walking, not elsewhere classified (R26.2);Pain Pain - Right/Left: Right Pain - part of body: Leg     Time: 8667-8651 PT Time Calculation (min) (ACUTE ONLY): 16 min  Charges:    $Gait Training: 8-22 mins PT General Charges $$ ACUTE PT VISIT: 1 Visit                     Ionia Schey B, PT, DPT Acute Rehab Services [phone]    Aradia Estey 06/11/2024, 4:00 PM

## 2024-06-11 NOTE — Progress Notes (Signed)
 "  HD#4 SUBJECTIVE:  Patient Summary: Terry Parks is a 69 y.o. with a pertinent PMH of HFrEF (EF 35%) and AF on eliquis , who presented with significant volume overload and admitted for acute on chronic HFrEF exacerbation requiring IV diuresis.  Overnight Events: None  Interim History: Today states that he's feeling pretty good and Unna boots were removed yesterday. He does think that he continues to have a little fluid on his legs still. He states that his breathing has been okay and no issues. He is eating and drinking without issue. His lowest weight was around 275 lbs he thinks. He would feel more comfortable to diurese for one more day in the hospital, but it reassured that he is improving and headed in the right direction. He also has no transportation home today given the unsafe road conditions and would be home by himself and is concerned about this prospect.  OBJECTIVE:  Vital Signs: Vitals:   06/10/24 2019 06/10/24 2344 06/11/24 0244 06/11/24 0816  BP: 119/81 117/83 126/74 119/73  Pulse: 87 81 81 84  Resp: 19 16 12 16   Temp: 98.5 F (36.9 C) 98.1 F (36.7 C) 98.6 F (37 C) 98.7 F (37.1 C)  TempSrc: Oral Oral Oral Oral  SpO2: 98% 99% 99% 96%  Weight:   132 kg   Height:       Supplemental O2: Room Air SpO2: 96 %  Filed Weights   06/09/24 0913 06/10/24 0308 06/11/24 0244  Weight: 135.9 kg 132.2 kg 132 kg   Admission weight 141  Intake/Output Summary (Last 24 hours) at 06/11/2024 1058 Last data filed at 06/11/2024 9182 Gross per 24 hour  Intake 835.48 ml  Output 6025 ml  Net -5189.52 ml   Net IO Since Admission: -25,525.95 mL [06/11/24 1058]  Physical Exam: Physical Exam Constitutional:      General: He is not in acute distress.    Appearance: He is not ill-appearing, toxic-appearing or diaphoretic.  HENT:     Nose: No congestion or rhinorrhea.  Eyes:     Conjunctiva/sclera: Conjunctivae normal.  Neck:     Vascular: No carotid bruit.  Cardiovascular:      Rate and Rhythm: Normal rate and regular rhythm.     Heart sounds: Normal heart sounds.  Pulmonary:     Effort: No respiratory distress.     Breath sounds: No stridor. No wheezing, rhonchi or rales.  Abdominal:     General: There is no distension.     Palpations: Abdomen is soft.     Tenderness: There is no abdominal tenderness. There is no guarding.  Musculoskeletal:        General: No swelling.     Right lower leg: Edema present.     Left lower leg: Edema present.     Comments: 1+ pitting edema to the knees  Skin:    General: Skin is warm and dry.     Findings: No erythema or rash.      Patient Lines/Drains/Airways Status     Active Line/Drains/Airways     Name Placement date Placement time Site Days   Peripheral IV 06/09/24 20 G 1.88 Left;Anterior Forearm 06/09/24  1020  Forearm  2            Pertinent labs and imaging:      Latest Ref Rng & Units 06/11/2024    1:45 AM 06/07/2024    1:32 AM 06/06/2024    7:41 PM  CBC  WBC 4.0 - 10.5 K/uL 7.4  5.8    Hemoglobin 13.0 - 17.0 g/dL 86.6  88.2  86.0   Hematocrit 39.0 - 52.0 % 40.5  37.1  41.0   Platelets 150 - 400 K/uL 196  130         Latest Ref Rng & Units 06/11/2024    1:45 AM 06/10/2024    3:34 AM 06/09/2024    3:43 AM  CMP  Glucose 70 - 99 mg/dL 830  816  786   BUN 8 - 23 mg/dL 19  17  14    Creatinine 0.61 - 1.24 mg/dL 8.27  8.59  8.54   Sodium 135 - 145 mmol/L 133  136  136   Potassium 3.5 - 5.1 mmol/L 4.9  4.0  4.1   Chloride 98 - 111 mmol/L 97  99  100   CO2 22 - 32 mmol/L 28  27  23    Calcium  8.9 - 10.3 mg/dL 8.6  8.3  8.1     No results found.  ASSESSMENT/PLAN:  Assessment: Principal Problem:   Acute on chronic HFrEF (heart failure with reduced ejection fraction) (HCC) Active Problems:   Uncontrolled type 2 diabetes mellitus with hyperglycemia, with long-term current use of insulin  (HCC)   Longstanding persistent atrial fibrillation (HCC)   Chronic anticoagulation   Acute on chronic  systolic congestive heart failure (HCC)  Terry Parks is a 69 y.o. with a pertinent PMH of HFrEF (EF 35%) and AF on eliquis , who presented with significant volume overload and admitted for acute on chronic HFrEF exacerbation requiring IV diuresis.  Plan: #Acute on chronic HFrEF (EF 35 to 40% this admission) Presented with dyspnea, orthopnea, edema, and bibasilar crackles with elevated proBNP to 4347 and missed diuresis. Underwent diuresis with IV Lasix  with good urinary output. He is still edematous over his lower extremities today. Weight of 132 this morning, down from 141kgs on admission. Creatinine slightly worse today at 1.72. Na low at 133 but K and Mag WNL. He is on room air. -Hold Lasix  this morning the setting of creatinine increase. Start decreased does of IV Lasix  40mg  this afternoon and transition to oral Lasix  80mg  daily tomorrow -Start Acetazolamide  250mg  IV once this afternoon. -strict Is and Os, daily weights -Continue Coreg , Sprinolactone, and Entresto  -Will defer starting SGLT2i given groin infection in October of 2025. -Patient lives alone and does not think that he would be safe at home given the weather today, and does not think his son can pick him up tomorrow.  #AKI on CKD 3 Creatinine increased from 1.40 to 1.72 today. He still has some edema in his lower extremities. Will decrease his Lasix  to 40mg  once today and add Diamox  250mg  once this afternoon -Decrease lasix  to 40mg  IV -Start Acetazolamide  -Check BMP tomorrow  #Uncontrolled T2DM on insulin  #Peripheral Neuropathy Last A1c 11.2 on 05/21/24. Home Lantus  50 units daily. Received 10 units of correctional yesterday. Fasting BG 201 this morning.  - Increase Lantus  to 40 Units daily and moderate SSI with meals, CBG AC/HS - Carb-controlled + 2g Na diet - Duloxetine  60 mg daily   #Atrial fibrillation on Eliquis  Denies palpations or CP. NSR on Telemetry today - telemetry  - Eliquis  5 mg BID  #Hyperlipidemia  -  atorvastatin  40 mg daily - ezetimibe  10 mg daily     #GERD  - Protonix  40 mg daily  Resolved problems  Thrombocytopenia/mild anemia Likely dilutional in the setting of volume overload.  Best Practice: Diet: Cardiac diet 2g sodium VTE: Full dose Eliquis  5mg   BID Code: Full  Disposition planning: Therapy Recs: Home Health, DME: shower chair Family Contact: Son, to be notified. DISPO: Anticipated discharge tomorrow to Home pending transportation and continued diuresis.  Signature:  Melvenia Morrison, MD [facility], PGY-1 10:58 AM, 06/11/2024  On Call pager [phone]  "

## 2024-06-11 NOTE — TOC Progression Note (Addendum)
 Transition of Care (TOC) - Progression Note  Rayfield Gobble RN, BSN Inpatient Care Management Unit 4E- RN Case Manager See Treatment Team for direct phone #   Patient Details  Name: Terry Parks MRN: [id number] Date of Birth: 08-26-1955  Transition of Care [facility]) CM/SW Contact  Gobble, Rayfield Hurst, RN Phone Number: 06/11/2024, 12:57 PM  Clinical Narrative:    TC made to pt's room to follow up on [facility] choice- pt voiced he has still not made a decision and ask that CM follow up tomorrow.  Discussed DME order for shower chair- pt voiced he would like that if insurance will cover- will send referral in to see if his Medicaid will cover.   Referral sent to [employer] for DME- as they have contract with pt's insurance.   1600- notified by 4E charge RN that pt has chosen [facility] needs- [facility] liaison updated and will follow for start of care   Expected Discharge Plan: Home w Home Health Services Barriers to Discharge: Continued Medical Work up               Expected Discharge Plan and Services   Discharge Planning Services: CM Consult Post Acute Care Choice: Home Health Living arrangements for the past 2 months: Apartment                   DME Agency: NA       HH Arranged: RN, Disease Management, PT, Nurse's Aide HH Agency:  (submitted via the hub) Date HH Agency Contacted: 06/07/24 Time HH Agency Contacted: 1947 Representative spoke with at [facility] Agency: Hub   Social Drivers of Health (SDOH) Interventions SDOH Screenings   Food Insecurity: No Food Insecurity (06/07/2024)  Housing: Low Risk (06/07/2024)  Transportation Needs: No Transportation Needs (06/07/2024)  Utilities: Not At Risk (06/07/2024)  Alcohol Screen: Low Risk (06/15/2022)  Depression (PHQ2-9): High Risk (05/16/2024)  Financial Resource Strain: Low Risk (07/12/2023)   Received from [facility]  Physical Activity: Sufficiently Active (08/12/2022)  Social Connections: Moderately Integrated (06/07/2024)   Stress: No Stress Concern Present (07/15/2022)  Tobacco Use: Low Risk (06/06/2024)    Readmission Risk Interventions     No data to display

## 2024-06-11 NOTE — Plan of Care (Signed)

## 2024-06-11 NOTE — Progress Notes (Signed)
 "  Heart Failure Stewardship Pharmacist Progress Note   PCP: Nooruddin, Saad, MD PCP-Cardiologist: Jerel Balding, MD    HPI:  69 year old male with PMH significant for HFrEF (last EF in 2020 35-40%), persistent atrial fibrillation (on Eliquis ), HTN, T2DM, CKD3, HLD, obesity, OSA, GERD, history of prostate cancer s/p prostatectomy (2019) who was admitted on 06/06/24 with a chief complaint of progressive shortness of breath, orthopnea, and leg swelling. He stated that his symptoms of worsening dyspnea on exertion and orthopnea began about 2 weeks ago. He reported having the flu around 12/31, but has had persistent fluid overload since. He endorsed continued urination on his PTA diuretic regimen and that he did had some response to increased diuretics but it has been worsening recently. He uses pill packs, but reported missing at least 1-2 days of therapy prior to admission. CXR showed mild cardiomegaly. He was in atrial fibrillation initially, but is now in NSR.   Patient reports he feels well. Breathing and LE edema improved since admission. UNNA boots removed yesterday. He stated he only missed ~1-2 doses weekly if any. He does have some of his pill packs from [facility] in his room, but his son would be able to bring the rest if he has any further medications changes for the pharmacist to help set up a pill box until Terry Parks can make him a new pill pack.   Current HF Medications: Diuretic: furosemide  40 mg IV x 1 Beta Blocker: carvedilol  6.25 mg BID ACE/ARB/ARNI: Entresto  49/51 mg BID MRA: spironolactone  25 mg daily  Prior to admission HF Medications: Diuretic: furosemide  80 mg BID Beta blocker: carvedilol  6.25 mg BID ACE/ARB/ARNI: Entresto  49/51 mg BID MRA: spironolactone  25 mg daily  Pertinent Lab Values: Serum creatinine 1.4>1.72 (BL ~1.24), BUN 19, Potassium 4.9 (delta check noted), Sodium 133, proBNP 4,347, Magnesium  2, A1c 11.2% Ferritin 166, TSAT 12  Vital Signs: Weight:  291 lbs (admission weight: 312 lbs) Blood pressure: 120/70s  Heart rate: 70-80s I/O: net -5.1 L yesterday; net -25.5 L since admission  Medication Assistance / Insurance Benefits Check: Does the patient have prescription insurance?  Yes Type of insurance plan: [employer] Medicare + Medicaid  Outpatient Pharmacy:  Prior to admission outpatient pharmacy: Terry Parks Is the patient willing to use [facility] TOC pharmacy at discharge? Yes Is the patient willing to transition their outpatient pharmacy to utilize a [facility] outpatient pharmacy?   Yes    Assessment: 1. Acute on chronic systolic CHF (LVEF 35-40%), due to non-ischemic etiology. NYHA class II symptoms. - Agree with furosemide  40 mg x1 today. Strict I/Os and daily weights. Creatinine 1.4>1.7 today. Keep K>4 and Mg>2. KCl 40 mEq BID ordered for replacement - K 4.9 today, would hold second dose (first dose given this AM) - Continue carvedilol  6.25 mg BID - Continue Entresto  49/51 mg BID - Continue spironolactone  25 mg daily - No SGLT2i - Jardiance  stopped in 02/2024 due to severe groin infection. Last A1c 11.2%. - TSAT 12 and ferritin 166 - consider adding IV iron     Plan: 1) Medication changes recommended at this time: - Hold second potassium dose today - Add IV iron   2)  Education  - Patient has been educated on current HF medications and potential additions to HF medication regimen - Patient verbalizes understanding that over the next few months, these medication doses may change and more medications may be added to optimize HF regimen - Patient has been educated on basic disease state pathophysiology and goals  of therapy  Terry Parks, PharmD, BCPS Heart Failure Stewardship Pharmacist Phone [phone]    "

## 2024-06-12 ENCOUNTER — Other Ambulatory Visit (HOSPITAL_COMMUNITY): Payer: Self-pay

## 2024-06-12 ENCOUNTER — Telehealth (HOSPITAL_COMMUNITY): Payer: Self-pay | Admitting: Pharmacy Technician

## 2024-06-12 ENCOUNTER — Telehealth (HOSPITAL_COMMUNITY): Payer: Self-pay | Admitting: Pharmacist

## 2024-06-12 DIAGNOSIS — E1122 Type 2 diabetes mellitus with diabetic chronic kidney disease: Secondary | ICD-10-CM | POA: Diagnosis not present

## 2024-06-12 DIAGNOSIS — E1142 Type 2 diabetes mellitus with diabetic polyneuropathy: Secondary | ICD-10-CM | POA: Diagnosis not present

## 2024-06-12 DIAGNOSIS — I5023 Acute on chronic systolic (congestive) heart failure: Secondary | ICD-10-CM | POA: Diagnosis not present

## 2024-06-12 DIAGNOSIS — E669 Obesity, unspecified: Secondary | ICD-10-CM

## 2024-06-12 DIAGNOSIS — F32A Depression, unspecified: Secondary | ICD-10-CM | POA: Diagnosis not present

## 2024-06-12 DIAGNOSIS — G4733 Obstructive sleep apnea (adult) (pediatric): Secondary | ICD-10-CM | POA: Diagnosis not present

## 2024-06-12 DIAGNOSIS — K219 Gastro-esophageal reflux disease without esophagitis: Secondary | ICD-10-CM | POA: Diagnosis not present

## 2024-06-12 DIAGNOSIS — N183 Chronic kidney disease, stage 3 unspecified: Secondary | ICD-10-CM | POA: Diagnosis not present

## 2024-06-12 DIAGNOSIS — D631 Anemia in chronic kidney disease: Secondary | ICD-10-CM

## 2024-06-12 DIAGNOSIS — D696 Thrombocytopenia, unspecified: Secondary | ICD-10-CM | POA: Diagnosis not present

## 2024-06-12 DIAGNOSIS — E785 Hyperlipidemia, unspecified: Secondary | ICD-10-CM | POA: Diagnosis not present

## 2024-06-12 DIAGNOSIS — I4891 Unspecified atrial fibrillation: Secondary | ICD-10-CM | POA: Diagnosis not present

## 2024-06-12 LAB — GLUCOSE, CAPILLARY: Glucose-Capillary: 198 mg/dL — ABNORMAL HIGH (ref 70–99)

## 2024-06-12 LAB — BASIC METABOLIC PANEL WITH GFR
Anion gap: 8 (ref 5–15)
BUN: 21 mg/dL (ref 8–23)
CO2: 29 mmol/L (ref 22–32)
Calcium: 8.8 mg/dL — ABNORMAL LOW (ref 8.9–10.3)
Chloride: 100 mmol/L (ref 98–111)
Creatinine, Ser: 1.74 mg/dL — ABNORMAL HIGH (ref 0.61–1.24)
GFR, Estimated: 43 mL/min — ABNORMAL LOW
Glucose, Bld: 171 mg/dL — ABNORMAL HIGH (ref 70–99)
Potassium: 4.8 mmol/L (ref 3.5–5.1)
Sodium: 136 mmol/L (ref 135–145)

## 2024-06-12 LAB — MAGNESIUM: Magnesium: 2 mg/dL (ref 1.7–2.4)

## 2024-06-12 MED ORDER — FUROSEMIDE 40 MG PO TABS
80.0000 mg | ORAL_TABLET | Freq: Two times a day (BID) | ORAL | Status: DC
  Administered 2024-06-12: 80 mg via ORAL
  Filled 2024-06-12: qty 2

## 2024-06-12 MED ORDER — FUROSEMIDE 80 MG PO TABS
80.0000 mg | ORAL_TABLET | Freq: Every day | ORAL | 0 refills | Status: AC
  Filled 2024-06-12: qty 11, 11d supply, fill #0

## 2024-06-12 MED ORDER — CARVEDILOL 6.25 MG PO TABS
12.5000 mg | ORAL_TABLET | Freq: Two times a day (BID) | ORAL | 11 refills | Status: AC
  Filled 2024-06-12: qty 60, 15d supply, fill #0

## 2024-06-12 NOTE — Telephone Encounter (Signed)
 Auth Submission: NO AUTH NEEDED Site of care: [facility] Payer: [employer] MEDICARE Medication & CPT/J Code(s) submitted: Venofer (Iron  Sucrose) J1756 Diagnosis Code: I50.23, D50.9 Route of submission (phone, fax, portal):  Phone # Fax # Auth type: Buy/Bill HB Units/visits requested: 300MG  X 3 DOSES Reference number:  Approval from: 06/12/2024 to 09/10/24    Dagoberto Armour, CPhT [facility] Phone: [phone] 06/12/2024

## 2024-06-12 NOTE — Telephone Encounter (Signed)
 Patient referred to infusion pharmacy team for ambulatory infusion of IV iron .  Insurance - [employer] Site of care - Site of care: [facility] Dx code - I50.23/D50.9 IV Iron  Therapy - Venofer 300mg  IV x 3 doses Infusion appointments - Scheduling team will schedule patient as soon as possible.   Sherry Pennant, PharmD, MPH, BCPS, CPP Clinical Pharmacist

## 2024-06-12 NOTE — Progress Notes (Signed)
 Occupational Therapy Treatment Patient Details Name: Terry Parks MRN: [id number] DOB: 07/25/1955 Today's Date: 06/12/2024   History of present illness Pt is a 69 yo male who presents to [facility] on 1/21 for progressive shortness of breath and swelling in the bilateral lower extremities. He stated worsening dyspnea on exertion and orthopnea began about 2 weeks ago. He reported having the flu around 12/31, but has had persistent fluid overload since. Pt admitted with acute on chronic HFrEF. PMH includes: CHF, HTN, Type 2 Diabetes, CKD Stage 3, OSA, Atrial fibrillation, Diabetic retinopathy, hyperlipdemia,  history of prostate cancer s/p prostatectomy (2019)   OT comments  Pt was able to complete mobility with supervision to CGA as cued on pacing self as he quickly got up to urinate. Pt did require CGA-min assist with donning socks but educated on how to complete with AE and reported they have slide on options as they feel BLE are still tight feeling from edema. Pt then was able to complete ambulation with supervision with 1 postural sway but was able to self recover. At this time recommendation for [facility] and Acute Occupational Therapy to follow.       If plan is discharge home, recommend the following:  A little help with walking and/or transfers;A lot of help with bathing/dressing/bathroom;Assistance with cooking/housework;Help with stairs or ramp for entrance   Equipment Recommendations  Tub/shower seat    Recommendations for Other Services      Precautions / Restrictions Precautions Precautions: Fall Recall of Precautions/Restrictions: Intact Restrictions Weight Bearing Restrictions Per Provider Order: No       Mobility Bed Mobility Overal bed mobility: Independent                  Transfers Overall transfer level: Needs assistance Equipment used: None Transfers: Sit to/from Stand Sit to Stand: Supervision           General transfer comment: no assist but cued to pace  self as jumped out bed to hurry to the bathroom due to urge to urinate     Balance   Sitting-balance support: Feet supported Sitting balance-Leahy Scale: Good     Standing balance support: No upper extremity supported Standing balance-Leahy Scale: Fair Standing balance comment: slightly got off balance when turing coner but able to stabilize self                           ADL either performed or assessed with clinical judgement   ADL Overall ADL's : Needs assistance/impaired Eating/Feeding: Independent   Grooming: Supervision/safety;Standing   Upper Body Bathing: Supervision/ safety;Standing   Lower Body Bathing: Minimal assistance;Contact guard assist;Sit to/from stand Lower Body Bathing Details (indicate cue type and reason): bellow ankles Upper Body Dressing : Set up;Sitting   Lower Body Dressing: Minimal assistance;Contact guard assist;Sit to/from stand Lower Body Dressing Details (indicate cue type and reason): bellow ankles Toilet Transfer: Supervision/safety           Functional mobility during ADLs: Supervision/safety;Contact guard assist General ADL Comments: no DME but noted to sometimes get of balance but dod not require physical assist, pt reports  they said since had change in meds and spoke to md already about this    Extremity/Trunk Assessment Upper Extremity Assessment Upper Extremity Assessment: Overall WFL for tasks assessed   Lower Extremity Assessment Lower Extremity Assessment: Defer to PT evaluation        Vision   Vision Assessment?: No apparent visual deficits;Wears glasses for reading;Wears  glasses for driving   Perception     Praxis     Communication Communication Communication: No apparent difficulties   Cognition Arousal: Alert Behavior During Therapy: WFL for tasks assessed/performed Cognition: No apparent impairments             OT - Cognition Comments: Pt has decrease saftey awarness                  Following commands: Intact        Cueing   Cueing Techniques: Verbal cues  Exercises      Shoulder Instructions       General Comments      Pertinent Vitals/ Pain       Pain Assessment Pain Assessment: 0-10 Pain Score: 1  Pain Location: Bilateral Lower extremity Pain Descriptors / Indicators: Tightness Pain Intervention(s): Monitored during session, Repositioned  Home Living                                          Prior Functioning/Environment              Frequency  Min 2X/week        Progress Toward Goals  OT Goals(current goals can now be found in the care plan section)  Progress towards OT goals: Progressing toward goals  Acute Rehab OT Goals Patient Stated Goal: to go home OT Goal Formulation: With patient Time For Goal Achievement: 06/22/24 Potential to Achieve Goals: Good  Plan      Co-evaluation                 AM-PAC OT 6 Clicks Daily Activity     Outcome Measure   Help from another person eating meals?: None Help from another person taking care of personal grooming?: None Help from another person toileting, which includes using toliet, bedpan, or urinal?: None Help from another person bathing (including washing, rinsing, drying)?: A Little Help from another person to put on and taking off regular upper body clothing?: None Help from another person to put on and taking off regular lower body clothing?: A Little 6 Click Score: 22    End of Session Equipment Utilized During Treatment: Gait belt  OT Visit Diagnosis: Unsteadiness on feet (R26.81);Other abnormalities of gait and mobility (R26.89)   Activity Tolerance Patient tolerated treatment well   Patient Left in chair;with call bell/phone within reach   Nurse Communication Mobility status        Time: 8953-8877 OT Time Calculation (min): 36 min  Charges: OT General Charges $OT Visit: 1 Visit OT Treatments $Self Care/Home Management : 23-37  mins  Warrick POUR OTR/L  Acute Rehab Services  [phone] office number   Warrick Berber 06/12/2024, 12:05 PM

## 2024-06-12 NOTE — Progress Notes (Signed)
 Patient refusing to have blood sugars checked this morning. Patient verbalized ,  My fingers are sore. I don't want to be stuck. Educated patient on reason for blood sugar checks and patient verbalized understanding. Will report off to oncoming shift.

## 2024-06-12 NOTE — Progress Notes (Signed)
 "  Heart Failure Stewardship Pharmacist Progress Note   PCP: Nooruddin, Saad, MD PCP-Cardiologist: Jerel Balding, MD    HPI:  69 year old male with PMH significant for HFrEF (last EF in 2020 35-40%), persistent atrial fibrillation (on Eliquis ), HTN, T2DM, CKD3, HLD, obesity, OSA, GERD, history of prostate cancer s/p prostatectomy (2019) who was admitted on 06/06/24 with a chief complaint of progressive shortness of breath, orthopnea, and leg swelling. He stated that his symptoms of worsening dyspnea on exertion and orthopnea began about 2 weeks ago. He reported having the flu around 12/31, but has had persistent fluid overload since. He endorsed continued urination on his PTA diuretic regimen and that he did had some response to increased diuretics but it has been worsening recently. He uses pill packs, but reported missing at least 1-2 days of therapy prior to admission. CXR showed mild cardiomegaly. He was in atrial fibrillation initially, but is now in NSR.   Patient reports he feels well, and is looking forward to discharging today. Breathing and LE edema improved since admission. He received one dose of IV Diamox  yesterday, which he stated helped remove extra fluid since he feels better today. Spoke with IM MD above increasing his carvedilol  to 12.5 mg BID to best optimize his GDMT at this time. I discussed this medication change with the patient at bedside. He does have some of his pill packs from [facility] in his room, but his son would be able to bring the rest if he has any further medications changes for the pharmacist to help set up a pill box until [name] can make him a new pill pack. He has no other issues or concerns at this time.  Current HF Medications: Diuretic: furosemide  80 mg BID Beta Blocker: carvedilol  6.25 mg BID ACE/ARB/ARNI: Entresto  49/51 mg BID MRA: spironolactone  25 mg daily  Prior to admission HF Medications: Diuretic: furosemide  80 mg BID Beta blocker:  carvedilol  6.25 mg BID ACE/ARB/ARNI: Entresto  49/51 mg BID MRA: spironolactone  25 mg daily  Pertinent Lab Values: Serum creatinine 1.4>1.74 (BL ~1.24), BUN 21, Potassium 4.8, Sodium 136, proBNP 4,347, Magnesium  2, A1c 11.2% Ferritin 166, TSAT 12  Vital Signs: Weight: 269 lbs (admission weight: 312 lbs) Blood pressure: 120/70s-90s Heart rate: 80s I/O: net -3.9 L yesterday; net -27.7 L since admission  Medication Assistance / Insurance Benefits Check: Does the patient have prescription insurance?  Yes Type of insurance plan: [employer] Medicare + Medicaid  Outpatient Pharmacy:  Prior to admission outpatient pharmacy: [name] Is the patient willing to use [facility] TOC pharmacy at discharge? Yes Is the patient willing to transition their outpatient pharmacy to utilize a [facility] outpatient pharmacy?   Yes    Assessment: 1. Acute on chronic systolic CHF (LVEF 35-40%), due to non-ischemic etiology. NYHA class II symptoms. - Transitioned to PTA furosemide  80 mg BID > recommended daily weights at discharge - Creatinine 1.4>1.74 today - HR 80s and SBP 120s on carvedilol  6.25 mg BID > consider increasing to 12.5 mg BID - Continue Entresto  49/51 mg BID > hold on increasing due to AKI - Continue spironolactone  25 mg daily  - No SGLT2i - Jardiance  stopped in 02/2024 due to severe groin infection. Last A1c 11.2%. - TSAT 12 and ferritin 166 - scheduled to receive IV iron  as an outpatient Plan: 1) Medication changes recommended at this time: - Consider increasing carvedilol  to 12.5 mg BID  2)  Education  - Patient has been educated on current HF medications and  potential additions to HF medication regimen - Patient verbalizes understanding that over the next few months, these medication doses may change and more medications may be added to optimize HF regimen - Patient has been educated on basic disease state pathophysiology and goals of therapy  B. Maegan English [place], PharmD PGY-1 Pharmacy  Resident [facility] 06/12/2024 9:17 AM     "

## 2024-06-12 NOTE — Discharge Summary (Signed)
 "  Name: Terry Parks MRN: [id number] DOB: [birth year] 69 y.o. PCP: Nooruddin, Saad, MD  Date of Admission: 06/06/2024  5:42 PM Date of Discharge: 06/12/2024 Attending Physician: Dr. Reyes Fenton  Discharge Diagnosis: 1. Principal Problem:   Acute on chronic HFrEF (heart failure with reduced ejection fraction) (HCC) Active Problems:   Uncontrolled type 2 diabetes mellitus with hyperglycemia, with long-term current use of insulin  (HCC)   Longstanding persistent atrial fibrillation (HCC)   AKI (acute kidney injury)   Chronic anticoagulation   Acute on chronic systolic congestive heart failure (HCC)   Type 2 diabetes mellitus with peripheral neuropathy [facility])   Discharge Medications: Allergies as of 06/12/2024       Reactions   Lisinopril  Cough        Medication List     STOP taking these medications    clotrimazole  1 % cream Commonly known as: Clotrimazole  Anti-Fungal   guaiFENesin -dextromethorphan  100-10 MG/5ML syrup Commonly known as: ROBITUSSIN DM   Magnesium  100 MG Caps       TAKE these medications    Accu-Chek Guide Test test strip Generic drug: glucose blood Use to check blood sugar up to 4 (four) times daily.   Accu-Chek Softclix Lancets lancets Use to test Blood Sugars 3 (three) times per day with meals and as needed What changed: Another medication with the same name was removed. Continue taking this medication, and follow the directions you see here.   atorvastatin  40 MG tablet Commonly known as: LIPITOR Take 1 tablet (40 mg total) by mouth every evening.   carvedilol  6.25 MG tablet Commonly known as: COREG  Take 2 tablets (12.5 mg total) by mouth 2 (two) times daily with a meal. What changed: how much to take   Comfort EZ Pen Needles 31G X 6 MM Misc Generic drug: Insulin  Pen Needle Please use 1 needle to inject your insulin . Never use the same needle twice   Dexcom G7 Sensor Misc Please use to check glucose and change every 10 days    Dexlansoprazole  30 MG capsule DR Commonly known as: Dexilant  Take 1 capsule (30 mg total) by mouth 2 (two) times daily.   DULoxetine  60 MG capsule Commonly known as: CYMBALTA  Take 1 capsule (60 mg total) by mouth daily. Please mail, we are changing all of his medications as of 02/29/2024   Eliquis  5 MG Tabs tablet Generic drug: apixaban  Take 1 tablet (5 mg total) by mouth 2 (two) times daily.   Entresto  49-51 MG Generic drug: sacubitril -valsartan  Take 1 tablet by mouth 2 (two) times daily.   ezetimibe  10 MG tablet Commonly known as: ZETIA  Take 1 tablet (10 mg total) by mouth every evening.   furosemide  80 MG tablet Commonly known as: Lasix  Take 1 tablet (80 mg total) by mouth daily. What changed: when to take this   Lantus  SoloStar 100 UNIT/ML Solostar Pen Generic drug: insulin  glargine Inject 50 Units into the skin daily.   Myrbetriq  25 MG Tb24 tablet Generic drug: mirabegron  ER Take 2 tablets (50 mg total) by mouth 1 hour prior to bedtime daily.   oxybutynin  15 MG 24 hr tablet Commonly known as: DITROPAN  XL Take one tablet (15 mg dose) by mouth daily.   potassium chloride  SA 20 MEQ tablet Commonly known as: KLOR-CON  M Take 2 tablets (40 mEq total) by mouth once a day for 15 days.   spironolactone  25 MG tablet Commonly known as: ALDACTONE  Take 1 tablet (25 mg total) by mouth daily.  Durable Medical Equipment  (From admission, onward)           Start     Ordered   06/11/24 0824  For home use only DME Shower stool  Once        06/11/24 9175            Disposition and follow-up:   Terry Parks was discharged from [facility] in Good condition.  At the hospital follow up visit please address:  1.  HF exacerbation: base at dry weight 122 kg. Patient reported no SOB prior to dishcarge. Still with bilateral LE edema but patient felt this is somewhat improved. Discharged with Lasix  80 mg once daily given bump in  Cr. Previous home dose 80 mg BID (increased given increased symptoms prior to admission).   Hx diabetes, diabetic neuropathy; worsening bilateral LE pain during hospitalization: Hx of HF and diabetes, but patient stated that his LE pain had been a little worse this admission but believes that it is likely due to fluid on and now fluid off, which is likely. Patient's skin of feet appears taught without erythema or warmth and there is wrinkling present. Low suspicion of clot. Patient would benefit from outpatient podiatry referral for toe nails and feet checks.  Iron  deficiency Anemia: would benefit from IV iron  outpatient.    2.  Labs / imaging needed at time of follow-up: BMP, Podiatry referral, IV iron  referral   3.  Pending labs/ test needing follow-up: n/a  Follow-up Appointments:  Contact information for follow-up providers     [facility]. Go on 06/18/2024.   Specialty: Cardiology Why: Hospital Follow-Up 06/18/2024 @ 9:00 AM Please bring all  medications to follow-up appointment Jolynn Pack, [facility] -Entrance C off of [address] [place] Parking at the door, or use GATE CODE [id number] to park under the building. Contact information: [address] [place]  [address] [phone]        [facility] (DME) Follow up.   Specialty: DME Services Why: Shower chair arranged- they will deliver to the home if insurance covers may call you if insurance does not. Contact information: [address] [place]  [address] [phone]             Contact information for after-discharge care     Home Medical Care     [facility] [place] [facility]) .   Service: Home Health Services Why: HHPT/OT/aide arranged- they will contact you to schedule Contact information: [address] [place]  [address] [phone]                       Hospital Course by problem list: Terry Parks is a 69 y.o. with a pertinent PMH of HFrEF (EF 35%) and AF on eliquis , who presented with significant volume overload and admitted for acute on chronic HFrEF exacerbation requiring IV diuresis now being discharged on hospital day 5 with the following pertinent hospital course:  #Acute on chronic HFrEF (EF 35%) Wet & warm on admission exam. Most consistent with volume overload from acute decompensated HFrEF given progressive DOE/orthopnea, edema, bibasilar crackles, proBNP 4,347, and missed diuretics/meds. CXR without infiltrate/effusion and sat 98-100% RA makes pneumonia and primary hypoxic lung process less likely. Viral panel negative and no fever/leukocytosis. Onset AF (new compared to 1/5) and recent flu as triggers; low suspicion for PE given therapeutic anticoagulation and clinical  picture dominated by anasarca Lactic acid normal. Diuresed with IV lasix , monitored I/O and daily weights, and electrolytes repleted. Home medications restarted (Entresto , spironolactone , and coreg ). Home Lasix  dosing is 80 mg BID. Discharged with Lasix  80 mg once daily and increased coreg  to 12.5. Given bump in Cr. Patient to follow up closely outpatient in clinic.      #CKD 3 Cr on admission 1.36-1.5 (baseline:1.3); monitored with diuresis. Decreased diuresis on day of discharge with Cr. 1.74. will monitor outpatient.    #Uncontrolled T2DM on insulin  therapy complicated with peripheral neuropathy Last A1c 11.2. Home Lantus  50 units daily. Continued 40 U basal insulin  plus moderate SSI with meals; CBG AC/HS and Carb-controlled + 2 g Na diet. Discharged on home dose of 50 units. Hx of HF and diabetes, but patient stated that his LE pain had been a little worse this admission but believes that it is likely due to fluid on and now fluid off, which is likely. Patient's skin of feet appears taught without erythema or warmth and there is wrinkling present. Low suspicion  of clot. Patient would benefit from outpatient podiatry referral for toe nails and feet checks.   #A-fib on Eliquis  AF on EKG; rate acceptable. Patient was asymptomatic, placed on telemetry, and continued Eliquis  5 mg BID   #Thrombocytopenia / mild normocytic anemia Plt 141, Hgb 12.5 (near baseline mild anemia). Iron  Panel suggestive of iron  deficiency anemia (ferritin 166, Iron  31, TIBC 269, %Sat 12). Recommended outpatient iron  supplementation.    Chronic stable conditions: #HLD: continue atorvastatin /zetia  #GERD/esophagitis hx: pantoprazole  inpatient #Depression: continue duloxetine  #OSA/Obesity: encourage CPAP use if available     Subjective Patient just woke up upon starting interview. Patient feeling very happy about being back to his dry weight today 269 lbs. He denies any issues with his breathing overnight. Had a BM this morning. He is still having good urine output. He is having some bilateral leg tenderness, he feels like his legs are tighter than his baseline and there is still fluid on them. He voiced understanding with Lasix  plan for 80 mg daily. He feels ready for discharge to home today and voiced understanding that he will follow up closely in the [facility]. He understood instruction to take pictures of lower legs daily and measure his weights daily. He voiced understanding to wear his compression stockings daily.   Discharge Exam:   BP 127/89 (BP Location: Right Arm)   Pulse 86   Temp 97.8 F (36.6 C) (Oral)   Resp 20   Ht 6' 1 (1.854 m)   Wt 122.3 kg   SpO2 98%   BMI 35.57 kg/m  Discharge exam:  Physical Exam Constitutional:      General: He is not in acute distress.    Appearance: He is not ill-appearing or toxic-appearing.  Cardiovascular:     Rate and Rhythm: Normal rate and regular rhythm.     Heart sounds: No murmur heard.    No friction rub. No gallop.  Pulmonary:     Effort: Pulmonary effort is normal.     Breath sounds: Normal breath sounds. No decreased  breath sounds, wheezing, rhonchi or rales.  Abdominal:     General: Bowel sounds are normal.     Palpations: Abdomen is soft.     Tenderness: There is no abdominal tenderness. There is no guarding.  Musculoskeletal:     Right lower leg: Edema (2+ to knees) present.     Left lower leg: Edema present.  Feet:  Right foot:     Skin integrity: Dry skin present. No erythema or warmth.     Toenail Condition: Right toenails are abnormally thick. Fungal disease present.    Left foot:     Skin integrity: Dry skin present. No erythema or warmth.     Toenail Condition: Left toenails are abnormally thick. Fungal disease present.    Comments: Skin taught, but wrinkling present. No pitting to bilateral feet, pitting starting above ankle. Swelling slightly greater L>R.  Skin:    General: Skin is warm and dry.  Neurological:     Mental Status: He is alert.       Pertinent Labs, Studies, and Procedures:     Latest Ref Rng & Units 06/11/2024    1:45 AM 06/07/2024    1:32 AM 06/06/2024    7:41 PM  CBC  WBC 4.0 - 10.5 K/uL 7.4  5.8    Hemoglobin 13.0 - 17.0 g/dL 86.6  88.2  86.0   Hematocrit 39.0 - 52.0 % 40.5  37.1  41.0   Platelets 150 - 400 K/uL 196  130         Latest Ref Rng & Units 06/12/2024    3:55 AM 06/11/2024    1:45 AM 06/10/2024    3:34 AM  CMP  Glucose 70 - 99 mg/dL 828  830  816   BUN 8 - 23 mg/dL 21  19  17    Creatinine 0.61 - 1.24 mg/dL 8.25  8.27  8.59   Sodium 135 - 145 mmol/L 136  133  136   Potassium 3.5 - 5.1 mmol/L 4.8  4.9  4.0   Chloride 98 - 111 mmol/L 100  97  99   CO2 22 - 32 mmol/L 29  28  27    Calcium  8.9 - 10.3 mg/dL 8.8  8.6  8.3     ECHOCARDIOGRAM COMPLETE Result Date: 06/07/2024    ECHOCARDIOGRAM REPORT   Patient Name:   Terry Parks Date of Exam: 06/07/2024 Medical Rec #:  [id number]         Height:       73.0 in Accession #:    [id number]        Weight:       312.1 lb Date of Birth:  1955/12/30          BSA:          2.600 m Patient Age:    68 years           BP:           135/95 mmHg Patient Gender: M                 HR:           91 bpm. Exam Location:  Inpatient Procedure: 2D Echo, Cardiac Doppler, Color Doppler and Intracardiac            Opacification Agent (Both Spectral and Color Flow Doppler were            utilized during procedure). Indications:    CHF- Acute Systolic  History:        Patient has prior history of Echocardiogram examinations, most                 recent 02/23/2019. Arrythmias:Atrial Fibrillation; Risk                 Factors:Diabetes, Dyslipidemia and Hypertension.  Sonographer:    Sherlean Dubin Referring Phys: [id number] PHOEBE CHUN  Sonographer Comments: Image acquisition challenging due to patient body habitus and Image acquisition challenging due to respiratory motion. IMPRESSIONS  1. Left ventricular ejection fraction, by estimation, is 35 to 40%. The left ventricle has moderately decreased function. The left ventricle demonstrates global hypokinesis. Left ventricular diastolic parameters are consistent with Grade III diastolic dysfunction (restrictive).  2. Right ventricular systolic function is normal. The right ventricular size is mildly enlarged. There is normal pulmonary artery systolic pressure.  3. Left atrial size was mildly dilated.  4. Right atrial size was mildly dilated.  5. The mitral valve is degenerative. Mild mitral valve regurgitation. No evidence of mitral stenosis.  6. The aortic valve is tricuspid. Aortic valve regurgitation is not visualized. Aortic valve sclerosis is present, with no evidence of aortic valve stenosis.  7. The inferior vena cava is dilated in size with <50% respiratory variability, suggesting right atrial pressure of 15 mmHg. Comparison(s): A prior study was performed on 02/23/2019. LVEF 35-40%, RV function mild reduction, mild LAE and RAE. Conclusion(s)/Recommendation(s): No left ventricular mural or apical thrombus/thrombi. FINDINGS  Left Ventricle: Left ventricular ejection fraction, by  estimation, is 35 to 40%. The left ventricle has moderately decreased function. The left ventricle demonstrates global hypokinesis. Definity  contrast agent was given IV to delineate the left ventricular endocardial borders. The left ventricular internal cavity size was normal in size. There is borderline left ventricular hypertrophy. Left ventricular diastolic parameters are consistent with Grade III diastolic dysfunction (restrictive). Right Ventricle: The right ventricular size is mildly enlarged. Right vetricular wall thickness was not well visualized. Right ventricular systolic function is normal. There is normal pulmonary artery systolic pressure. The tricuspid regurgitant velocity  is 1.15 m/s, and with an assumed right atrial pressure of 15 mmHg, the estimated right ventricular systolic pressure is 20.3 mmHg. Left Atrium: Left atrial size was mildly dilated. Right Atrium: Right atrial size was mildly dilated. Pericardium: There is no evidence of pericardial effusion. Mitral Valve: The mitral valve is degenerative in appearance. Mild mitral annular calcification. Mild mitral valve regurgitation. No evidence of mitral valve stenosis. Tricuspid Valve: The tricuspid valve is grossly normal. Tricuspid valve regurgitation is trivial. No evidence of tricuspid stenosis. Aortic Valve: The aortic valve is tricuspid. Aortic valve regurgitation is not visualized. Aortic valve sclerosis is present, with no evidence of aortic valve stenosis. Aortic valve mean gradient measures 2.0 mmHg. Aortic valve peak gradient measures 3.5  mmHg. Aortic valve area, by VTI measures 3.05 cm. Pulmonic Valve: The pulmonic valve was normal in structure. Pulmonic valve regurgitation is mild. No evidence of pulmonic stenosis. Aorta: The aortic root and ascending aorta are structurally normal, with no evidence of dilitation. Venous: The inferior vena cava is dilated in size with less than 50% respiratory variability, suggesting right atrial  pressure of 15 mmHg. IAS/Shunts: The interatrial septum was not well visualized.  LEFT VENTRICLE PLAX 2D LVIDd:         4.90 cm   Diastology LVIDs:         4.10 cm   LV e' medial:    6.90 cm/s LV PW:         1.20 cm   LV E/e' medial:  13.5 LV IVS:        1.40 cm   LV e' lateral:   8.18 cm/s LVOT diam:     2.20 cm   LV E/e' lateral: 11.4 LV SV:         49 LV SV Index:   19 LVOT Area:  3.80 cm  RIGHT VENTRICLE             IVC RV Basal diam:  4.70 cm     IVC diam: 2.50 cm RV Mid diam:    4.30 cm RV S prime:     12.90 cm/s TAPSE (M-mode): 1.7 cm LEFT ATRIUM             Index        RIGHT ATRIUM           Index LA diam:        5.00 cm 1.92 cm/m   RA Area:     26.90 cm LA Vol (A2C):   98.2 ml 37.78 ml/m  RA Volume:   88.50 ml  34.04 ml/m LA Vol (A4C):   85.3 ml 32.81 ml/m LA Biplane Vol: 91.9 ml 35.35 ml/m  AORTIC VALVE AV Area (Vmax):    3.09 cm AV Area (Vmean):   2.93 cm AV Area (VTI):     3.05 cm AV Vmax:           93.25 cm/s AV Vmean:          63.600 cm/s AV VTI:            0.161 m AV Peak Grad:      3.5 mmHg AV Mean Grad:      2.0 mmHg LVOT Vmax:         75.90 cm/s LVOT Vmean:        49.100 cm/s LVOT VTI:          0.129 m LVOT/AV VTI ratio: 0.80  AORTA Ao Root diam: 3.30 cm Ao Asc diam:  3.40 cm MITRAL VALVE               TRICUSPID VALVE MV Area (PHT): 5.97 cm    TR Peak grad:   5.3 mmHg MV Decel Time: 127 msec    TR Vmax:        115.00 cm/s MR Peak grad: 10.8 mmHg MR Vmax:      164.00 cm/s  SHUNTS MV E velocity: 93.00 cm/s  Systemic VTI:  0.13 m MV A velocity: 38.30 cm/s  Systemic Diam: 2.20 cm MV E/A ratio:  2.43 Sunit Tolia Electronically signed by Madonna Large Signature Date/Time: 06/07/2024/4:59:23 PM    Final    DG Chest 2 View Result Date: 06/06/2024 EXAM: 2 VIEW(S) XRAY OF THE CHEST 06/06/2024 07:47:00 PM COMPARISON: 06/22/2022 CLINICAL HISTORY: SOB FINDINGS: LUNGS AND PLEURA: No focal pulmonary opacity. No pleural effusion. No pneumothorax. HEART AND MEDIASTINUM: Mild cardiomegaly. BONES AND  SOFT TISSUES: No acute osseous abnormality. IMPRESSION: 1. No acute cardiopulmonary abnormality. 2. Mild cardiomegaly. Electronically signed by: Pinkie Pebbles MD 06/06/2024 07:50 PM EST RP Workstation: [id number]     Discharge Instructions: Discharge Instructions     Amb Referral to Intravenous Iron  Therapy   Complete by: As directed    You have been referred to [facility] Infusion team for IV Iron  Infusions. The infusion pharmacy team will reach out to you with appointment information.    Primary Diagnosis Code for IV Iron : D50.9 - Iron  deficiency Anemia   Secondary diagnosis code for IV iron : I50.xx - Heart failure related dx codes   Call MD for:  difficulty breathing, headache or visual disturbances   Complete by: As directed    Call MD for:  extreme fatigue   Complete by: As directed    Call MD for:  hives   Complete by: As directed  Call MD for:  persistant dizziness or light-headedness   Complete by: As directed    Call MD for:  persistant nausea and vomiting   Complete by: As directed    Call MD for:  severe uncontrolled pain   Complete by: As directed    Call MD for:  temperature >100.4   Complete by: As directed    Discharge instructions   Complete by: As directed    Thank you for allowing us  to be part of your care. You were hospitalized for Heart Failure Exacerbation. We treated you with IV diuretics.    See the changes in your medications and management of your chronic conditions below:  *For your Heart Failure - Please continue your home medications of Entresto  and spironolactone  - INCREASE your coreg  to 12.5 mg daily  - take Lasix  40 mg DAILY until seen in the clinic  - take daily weights, keep your feet elevated, wear compression stockings.   *For your Afib -continue your eliquis   *For your Anemia  - referral will be placed for IV iron  when you are seen in the clinic.    Continue taking your other medications as prescribed.    FOLLOW UP  APPOINTMENTS: You should get a call for follow up with PCP soon, If you do not hear back by the end of the week, please call the clinic.   Please call your PCP or our clinic if you have any questions or concerns, we may be able to help and keep you from a long and expensive emergency room wait. Our clinic and after hours phone number is [phone]. The best time to call is Monday through Friday 9 am to 4 pm but there is always someone available 24/7 if you have an emergency. If you need medication refills please notify your pharmacy one week in advance and they will send us  a request.   We are glad you are feeling better,  Sallyanne Primas Internal Medicine Inpatient Teaching Service at [facility] [address]   Increase activity slowly   Complete by: As directed        Signed: Nosson Wender, DO 06/12/2024, 11:18 AM     "

## 2024-06-12 NOTE — Progress Notes (Addendum)
 Discharge instructions reviewed with pt. Pt verbalized understanding, handout provided, Copy of instructions given to pt. [facility] has filled a script for pt, and will be picked up on the way out for discharge or to the lounge if son not here shortly. Pt currently eating his lunch and will get dressed.  Pt will be d/c'd via wheelchair with belongings and will be escorted by staff.  Laiklyn Pilkenton,RN SWOT

## 2024-06-12 NOTE — Progress Notes (Incomplete)
 "   HEART & VASCULAR TRANSITION OF CARE CONSULT NOTE     PCP: Nooruddin, Saad, MD  Cardiologist: Jerel Balding, MD   Chief Complaint: Chronic HFrEF  HPI: Referred to clinic by Dr. Benuel for heart failure consultation.   Terry Parks is a 69 y.o. male with chronic HFrEF, persistent a fib on Eliquis , HTN, DM2, CKD3, HLD, obesity, OSA and hx prostate cancer s/p prostatectomy 19'.  Followed by Dr. Balding. Volume overloaded during several visits in the setting of medication and diet noncompliance.   Admitted 1/26 with A/C HFrEF. Had been sick with the Flu during the end of December and reports volume overload since then. Did miss a couple of his medications (gets pill packs). Diuresed well with IV lasix . In a fib on admission but converted to NSR. 269 lbs at discharge.   Today he presents for AHF [facility] clinic visit. Overall feeling ***. Denies palpitations, CP, dizziness, edema, or PND/Orthopnea. *** SOB. Appetite ok. No fever or chills. Weight at home *** pounds. Taking all medications. Denies ETOH, tobacco or drug use.    Past Medical History:  Diagnosis Date   Abscessed tooth    top back large cavity no pain or drainage, one on bottom  pt pulled tooth 4-5 months ago, right top large hole in tooth   AKI (acute kidney injury)    Allergic rhinitis    Anemia    Anxiety    Asthma    Atrial fibrillation (HCC)    BPH (benign prostatic hypertrophy)    Massive BPH noted on cystoscopy 1/23/ 2012 by Dr. Alline.   Cancer [facility])    prostate cancer 2019   Cardiomyopathy [facility])    CHF (congestive heart failure) (HCC)    Cough 03/30/2012   Depression    Diabetes mellitus 04/08/2008   type 2   Dyspnea    Dysrhythmia 2019   Foley catheter in place 07-05-17 placed   Fracture, orbital (HCC) 2021   Right   GERD (gastroesophageal reflux disease) 2020   Headache(784.0)    hx migraines none recent   History of esophagitis 12/16/2019   Hyperlipemia    Hypertension    Hypertensive  cardiopathy 03/01/2006   2-D echocardiogram 02/01/2012 showed moderate LVH, mildly to moderately reduced left ventricular systolic function with an estimated ejection fraction of 40-45%, and diffuse hypokinesis.  A nuclear medicine stress study done 01/31/2012 showed no reversible ischemia, a small mid anterior wall fixed defect/infarct, and ejection fraction 42%.       Neck pain    Nephrolithiasis 05/29/2010   CT scan of abdomen/pelvis on 05/29/2010 showed an obstructing approximate 1-2 mm calculus at the left UVJ, and an approximate 1-2 mm left lower pole renal calculus.   Patient had continuing severe pain , and an elevation of his serum creatinine to a value of 1.75 on 06/06/2010.  Patient underwent cystoscopy on 06/08/2010 by Dr. Alline, but attempts at retrograde pyelogram and ureteroscopy were unsucc   Numbness 01/08/2018   Obstructive sleep apnea 03/06/2008   Sleep study 03/06/08 showed severe OSA/hypopnea syndrome, with successful CPAP titration to 13 CWP using a medium ResMed Mirage Quattro full face mask with heated humidifier.    Personal history of prostate cancer 10/06/2017   Rash 04/17/2014   Renal calculus 05/29/2010   CT scan of abdomen/pelvis on 05/29/2010 showed an obstructing approximate 1-2 mm calculus at the left UVJ, and an approximate 1-2 mm left lower pole renal calculus.   Patient had continuing severe pain , and  an elevation of his serum creatinine to a value of 1.75 on 06/06/2010.  The stone had apparently passed and was not seen on repeat CT 06/08/2010.   Sleep apnea    haven't use cpap in 2 years   Tooth pain 10/29/2017   Uncontrolled type 2 diabetes mellitus with hyperglycemia, with long-term current use of insulin  (HCC) 04/08/2008   GLP1 discontinued at hospital discharge 06/2022; concern about esophagitis? Delayed gastric emptying?  Intolerant to metformin      Urinary straining 11/02/2016    Current Outpatient Medications  Medication Sig Dispense Refill   apixaban   (ELIQUIS ) 5 MG TABS tablet Take 1 tablet (5 mg total) by mouth 2 (two) times daily. 60 tablet 11   atorvastatin  (LIPITOR) 40 MG tablet Take 1 tablet (40 mg total) by mouth every evening. 30 tablet 6   carvedilol  (COREG ) 6.25 MG tablet Take 2 tablets (12.5 mg total) by mouth 2 (two) times daily with a meal. 60 tablet 11   Continuous Glucose Sensor (DEXCOM G7 SENSOR) MISC Please use to check glucose and change every 10 days 9 each 3   Dexlansoprazole  (DEXILANT ) 30 MG capsule DR Take 1 capsule (30 mg total) by mouth 2 (two) times daily. (Patient not taking: Reported on 03/15/2024) 60 capsule 12   DULoxetine  (CYMBALTA ) 60 MG capsule Take 1 capsule (60 mg total) by mouth daily. Please mail, we are changing all of his medications as of 02/29/2024 90 capsule 3   ezetimibe  (ZETIA ) 10 MG tablet Take 1 tablet (10 mg total) by mouth every evening. 90 tablet 2   furosemide  (LASIX ) 80 MG tablet Take 1 tablet (80 mg total) by mouth daily. 11 tablet 0   glucose blood (ACCU-CHEK GUIDE TEST) test strip Use to check blood sugar up to 4 (four) times daily. 100 each 12   insulin  glargine (LANTUS  SOLOSTAR) 100 UNIT/ML Solostar Pen Inject 50 Units into the skin daily. 15 mL 11   Insulin  Pen Needle (COMFORT EZ PEN NEEDLES) 31G X 6 MM MISC Please use 1 needle to inject your insulin . Never use the same needle twice 100 each 3   Lancets MISC Use to test Blood Sugars 3 (three) times per day with meals and as needed 360 each 3   mirabegron  ER (MYRBETRIQ ) 25 MG TB24 tablet Take 2 tablets (50 mg total) by mouth 1 hour prior to bedtime daily. 180 tablet 1   oxybutynin  (DITROPAN  XL) 15 MG 24 hr tablet Take one tablet (15 mg dose) by mouth daily. 90 tablet 3   potassium chloride  SA (KLOR-CON  M) 20 MEQ tablet Take 2 tablets (40 mEq total) by mouth once a day for 15 days. 30 tablet 0   sacubitril -valsartan  (ENTRESTO ) 49-51 MG Take 1 tablet by mouth 2 (two) times daily. 60 tablet 11   spironolactone  (ALDACTONE ) 25 MG tablet Take 1  tablet (25 mg total) by mouth daily. 30 tablet 11   No current facility-administered medications for this visit.   Facility-Administered Medications Ordered in Other Visits  Medication Dose Route Frequency Provider Last Rate Last Admin   acetaminophen  (TYLENOL ) tablet 650 mg  650 mg Oral Q6H PRN Zheng, Michael, DO       Or   acetaminophen  (TYLENOL ) suppository 650 mg  650 mg Rectal Q6H PRN Zheng, Michael, DO       apixaban  (ELIQUIS ) tablet 5 mg  5 mg Oral BID Zheng, Michael, DO   5 mg at 06/12/24 0804   atorvastatin  (LIPITOR) tablet 40 mg  40 mg Oral  QPM Zheng, Michael, DO   40 mg at 06/11/24 8161   carvedilol  (COREG ) tablet 6.25 mg  6.25 mg Oral BID WC Rihner, Emilie, DO   6.25 mg at 06/12/24 9191   DULoxetine  (CYMBALTA ) DR capsule 60 mg  60 mg Oral Daily Zheng, Michael, DO   60 mg at 06/12/24 9193   ezetimibe  (ZETIA ) tablet 10 mg  10 mg Oral QPM Zheng, Michael, DO   10 mg at 06/11/24 8161   furosemide  (LASIX ) tablet 80 mg  80 mg Oral BID Rihner, Emilie, DO   80 mg at 06/12/24 0805   insulin  aspart (novoLOG ) injection 0-15 Units  0-15 Units Subcutaneous TID WC Zheng, Michael, DO   5 Units at 06/11/24 1839   insulin  glargine (LANTUS ) injection 40 Units  40 Units Subcutaneous Daily Smucker, Melvenia, MD   40 Units at 06/12/24 1226   pantoprazole  (PROTONIX ) EC tablet 40 mg  40 mg Oral Daily Zheng, Michael, DO   40 mg at 06/12/24 0805   sacubitril -valsartan  (ENTRESTO ) 49-51 mg per tablet  1 tablet Oral BID Zheng, Michael, DO   1 tablet at 06/12/24 9195   senna-docusate (Senokot-S) tablet 1 tablet  1 tablet Oral QHS Juberg, Christopher, DO   1 tablet at 06/11/24 2117   spironolactone  (ALDACTONE ) tablet 25 mg  25 mg Oral Daily Zheng, Michael, DO   25 mg at 06/12/24 0805    Allergies[1]    Social History   Socioeconomic History   Marital status: Single    Spouse name: Not on file   Number of children: 4   Years of education: 16   Highest education level: Not on file  Occupational History    Occupation:        Employer: UNEMPLOYED  Tobacco Use   Smoking status: Never   Smokeless tobacco: Never  Vaping Use   Vaping status: Never Used  Substance and Sexual Activity   Alcohol use: No    Alcohol/week: 0.0 standard drinks of alcohol   Drug use: No   Sexual activity: Not Currently  Other Topics Concern   Not on file  Social History Narrative   Divorced, 4 children, lives alone.     Social Drivers of Health   Tobacco Use: Low Risk (06/06/2024)   Patient History    Smoking Tobacco Use: Never    Smokeless Tobacco Use: Never    Passive Exposure: Not on file  Financial Resource Strain: Low Risk (07/12/2023)   Received from [facility]   Overall Financial Resource Strain (CARDIA)    Difficulty of Paying Living Expenses: Not hard at all  Food Insecurity: No Food Insecurity (06/07/2024)   Epic    Worried About Radiation Protection Practitioner of Food in the Last Year: Never true    Ran Out of Food in the Last Year: Never true  Transportation Needs: No Transportation Needs (06/07/2024)   Epic    Lack of Transportation (Medical): No    Lack of Transportation (Non-Medical): No  Physical Activity: Sufficiently Active (08/12/2022)   Exercise Vital Sign    Days of Exercise per Week: 7 days    Minutes of Exercise per Session: 30 min  Stress: No Stress Concern Present (07/15/2022)   [employer] of Occupational Health - Occupational Stress Questionnaire    Feeling of Stress : Only a little  Social Connections: Moderately Integrated (06/07/2024)   Social Connection and Isolation Panel    Frequency of Communication with Friends and Family: Three times a week    Frequency of  Social Gatherings with Friends and Family: Twice a week    Attends Religious Services: More than 4 times per year    Active Member of Clubs or Organizations: Yes    Attends Banker Meetings: More than 4 times per year    Marital Status: Never married  Intimate Partner Violence: Not At Risk (06/07/2024)   Epic     Fear of Current or Ex-Partner: No    Emotionally Abused: No    Physically Abused: No    Sexually Abused: No  Depression (PHQ2-9): High Risk (05/16/2024)   Depression (PHQ2-9)    PHQ-2 Score: 12  Alcohol Screen: Low Risk (06/15/2022)   Alcohol Screen    Last Alcohol Screening Score (AUDIT): 0  Housing: Low Risk (06/07/2024)   Epic    Unable to Pay for Housing in the Last Year: No    Number of Times Moved in the Last Year: 0    Homeless in the Last Year: No  Utilities: Not At Risk (06/07/2024)   Epic    Threatened with loss of utilities: No  Health Literacy: Not on file      Family History  Problem Relation Age of Onset   Diabetes Mother    Ulcerative colitis Mother    Breast cancer Mother    Hypertension Father    Stroke Father    Diabetes Maternal Grandmother    Colon cancer Neg Hx    Prostate cancer Neg Hx    Heart attack Neg Hx    Esophageal cancer Neg Hx    Inflammatory bowel disease Neg Hx    Liver disease Neg Hx    Pancreatic cancer Neg Hx    Rectal cancer Neg Hx    Stomach cancer Neg Hx    Colon polyps Neg Hx     There were no vitals filed for this visit.  PHYSICAL EXAM: General:  *** appearing.  No respiratory difficulty Neck: JVD *** cm.  Cor: Regular rate & rhythm. No murmurs. Lungs: clear Extremities: no edema  Neuro: alert & oriented x 3. Affect pleasant.   ECG:   ASSESSMENT & PLAN: A/C HFrEF - Echo EF 35-40%, LV with GHK, GIIIDD, RV normal, LA/RA mildly dilated, mild MR - Cath *** - NYHA *** - Volume ***, ReDs ***. Continue lasix  80 mg daily - Continue Coreg  12.5 mg BID - Continue Entresto  49-51 mg BID - Continue spiro 25 mg daily - No SGLT2i with uncontrolled DB. A1c 1/26 11.2 (previously >14 on recent checks) - ***  Persistent a fib - Continue Eliquis  5 mg BID - Previously on amio 200 mg daily. Not adherent to medication regimen so it was stopped as he was rate controlled.   HTN - BP ***  CKD 3b - SCr baseline ~1.3 - Up to 1.7 at  discharge  DM2 - A1c 11.2 1/26 - SGLT2i if A1c <10 - Insulin  per primary    Referred to HFSW (PCP, Medications, Transportation, ETOH Abuse, Drug Abuse, Insurance, Financial ): Yes or No Refer to Pharmacy: Yes or No Refer to Home Health: Yes on No Refer to Advanced Heart Failure Clinic: Yes or no  Refer to General Cardiology: Yes or No  Follow up       [1]  Allergies Allergen Reactions   Lisinopril  Cough   "

## 2024-06-12 NOTE — TOC Transition Note (Addendum)
 Transition of Care (TOC) - Discharge Note Rayfield Gobble RN, BSN Inpatient Care Management Unit 4E- RN Case Manager See Treatment Team for direct phone #   Patient Details  Name: Terry Parks MRN: [id number] Date of Birth: 1955/05/23  Transition of Care [facility]) CM/SW Contact:  Gobble Rayfield Hurst, RN Phone Number: 06/12/2024, 11:42 AM   Clinical Narrative:    Pt stable for transition home today, HH has been set up with [facility]- they will contact pt for start of care visit scheduling.   DME- shower chair referral made to [employer].   Pt will have ride later today for transport home  IP CM interventions have been completed no further needs noted.   Final next level of care: Home w Home Health Services Barriers to Discharge: Barriers Resolved   Patient Goals and CMS Choice Patient states their goals for this hospitalization and ongoing recovery are:: patient wants to return home with home heatlh CMS Medicare.gov Compare Post Acute Care list provided to:: Patient Choice offered to / list presented to : Patient (Patient does not have an agency preference- Submitted via the hub)      Discharge Placement               Home w/ [facility]        Discharge Plan and Services Additional resources added to the After Visit Summary for     Discharge Planning Services: CM Consult Post Acute Care Choice: Durable Medical Equipment, Home Health          DME Arranged: Shower stool DME Agency: [facility] Date DME Agency Contacted: 06/11/24 Time DME Agency Contacted: [phone] Representative spoke with at DME Agency: [facility] HH Arranged: RN, Disease Management, PT, Nurse's Aide HH Agency: [facility] Date [facility] Agency Contacted: 06/11/24 Time HH Agency Contacted: 1947 Representative spoke with at [facility] Agency: [facility]  Social Drivers of Health (SDOH) Interventions SDOH Screenings   Food Insecurity: No Food Insecurity (06/07/2024)  Housing: Low Risk (06/07/2024)  Transportation Needs:  No Transportation Needs (06/07/2024)  Utilities: Not At Risk (06/07/2024)  Alcohol Screen: Low Risk (06/15/2022)  Depression (PHQ2-9): High Risk (05/16/2024)  Financial Resource Strain: Low Risk (07/12/2023)   Received from [facility]  Physical Activity: Sufficiently Active (08/12/2022)  Social Connections: Moderately Integrated (06/07/2024)  Stress: No Stress Concern Present (07/15/2022)  Tobacco Use: Low Risk (06/06/2024)     Readmission Risk Interventions    06/12/2024   11:42 AM  Readmission Risk Prevention Plan  Post Dischage Appt Complete  Medication Screening Complete  Transportation Screening Complete

## 2024-06-13 ENCOUNTER — Other Ambulatory Visit: Payer: Self-pay

## 2024-06-13 ENCOUNTER — Inpatient Hospital Stay: Payer: Self-pay | Admitting: Student

## 2024-06-15 ENCOUNTER — Telehealth (HOSPITAL_COMMUNITY): Payer: Self-pay

## 2024-06-15 NOTE — Telephone Encounter (Signed)
 Called to confirm/remind patient of their appointment at the [facility] on 06/16/24 9:00.   Patient states he will call back to reschedule he is in the store.

## 2024-06-18 ENCOUNTER — Ambulatory Visit (HOSPITAL_COMMUNITY)

## 2024-06-19 ENCOUNTER — Inpatient Hospital Stay: Admitting: Student

## 2024-06-21 ENCOUNTER — Ambulatory Visit (HOSPITAL_COMMUNITY): Admission: RE | Admit: 2024-06-21 | Source: Ambulatory Visit

## 2024-06-26 ENCOUNTER — Inpatient Hospital Stay: Admitting: Student
